# Patient Record
Sex: Female | Born: 1955
Health system: Southern US, Community
[De-identification: ages and names within clinical notes are randomized; demographics above are authoritative.]

## PROBLEM LIST (undated history)

## (undated) ENCOUNTER — Telehealth

## (undated) ENCOUNTER — Encounter

## (undated) ENCOUNTER — Encounter: Payer: PRIVATE HEALTH INSURANCE | Attending: Internal Medicine | Primary: Internal Medicine

## (undated) ENCOUNTER — Inpatient Hospital Stay

## (undated) ENCOUNTER — Encounter: Attending: Internal Medicine | Primary: Internal Medicine

## (undated) ENCOUNTER — Telehealth: Attending: Internal Medicine | Primary: Internal Medicine

## (undated) ENCOUNTER — Encounter: Attending: Health Service | Primary: Health Service

## (undated) ENCOUNTER — Ambulatory Visit

## (undated) ENCOUNTER — Telehealth
Attending: Pharmacist Clinician (PhC)/ Clinical Pharmacy Specialist | Primary: Pharmacist Clinician (PhC)/ Clinical Pharmacy Specialist

## (undated) ENCOUNTER — Encounter: Attending: Pulmonary Disease | Primary: Pulmonary Disease

## (undated) ENCOUNTER — Encounter: Attending: Gastroenterology | Primary: Gastroenterology

## (undated) ENCOUNTER — Ambulatory Visit: Payer: PRIVATE HEALTH INSURANCE

## (undated) ENCOUNTER — Ambulatory Visit: Payer: PRIVATE HEALTH INSURANCE | Attending: Health Service | Primary: Health Service

## (undated) ENCOUNTER — Ambulatory Visit: Payer: PRIVATE HEALTH INSURANCE | Attending: Psychiatry | Primary: Psychiatry

## (undated) ENCOUNTER — Telehealth: Attending: Psychiatry | Primary: Psychiatry

## (undated) ENCOUNTER — Encounter: Attending: Nurse Practitioner | Primary: Nurse Practitioner

## (undated) ENCOUNTER — Telehealth: Attending: Gastroenterology | Primary: Gastroenterology

## (undated) ENCOUNTER — Telehealth: Attending: Family | Primary: Family

## (undated) ENCOUNTER — Telehealth: Attending: Health Service | Primary: Health Service

## (undated) ENCOUNTER — Ambulatory Visit: Payer: PRIVATE HEALTH INSURANCE | Attending: Clinical | Primary: Clinical

## (undated) ENCOUNTER — Encounter: Attending: Certified Registered" | Primary: Certified Registered"

## (undated) ENCOUNTER — Ambulatory Visit: Attending: Family | Primary: Family

## (undated) ENCOUNTER — Ambulatory Visit
Payer: PRIVATE HEALTH INSURANCE | Attending: Student in an Organized Health Care Education/Training Program | Primary: Student in an Organized Health Care Education/Training Program

## (undated) DIAGNOSIS — F329 Major depressive disorder, single episode, unspecified: Secondary | ICD-10-CM

## (undated) DIAGNOSIS — D649 Anemia, unspecified: Secondary | ICD-10-CM

## (undated) DIAGNOSIS — M810 Age-related osteoporosis without current pathological fracture: Secondary | ICD-10-CM

## (undated) DIAGNOSIS — E7800 Pure hypercholesterolemia, unspecified: Secondary | ICD-10-CM

## (undated) DIAGNOSIS — K228 Other specified diseases of esophagus: Secondary | ICD-10-CM

## (undated) DIAGNOSIS — M1990 Unspecified osteoarthritis, unspecified site: Secondary | ICD-10-CM

## (undated) DIAGNOSIS — K7460 Unspecified cirrhosis of liver: Secondary | ICD-10-CM

## (undated) DIAGNOSIS — K760 Fatty (change of) liver, not elsewhere classified: Secondary | ICD-10-CM

## (undated) DIAGNOSIS — I509 Heart failure, unspecified: Secondary | ICD-10-CM

## (undated) DIAGNOSIS — Z9889 Other specified postprocedural states: Secondary | ICD-10-CM

## (undated) DIAGNOSIS — M797 Fibromyalgia: Secondary | ICD-10-CM

## (undated) DIAGNOSIS — E039 Hypothyroidism, unspecified: Secondary | ICD-10-CM

## (undated) DIAGNOSIS — K2289 Other specified disease of esophagus: Secondary | ICD-10-CM

## (undated) DIAGNOSIS — H5789 Other specified disorders of eye and adnexa: Secondary | ICD-10-CM

## (undated) DIAGNOSIS — I10 Essential (primary) hypertension: Secondary | ICD-10-CM

## (undated) DIAGNOSIS — F32A Depression, unspecified: Secondary | ICD-10-CM

## (undated) DIAGNOSIS — R112 Nausea with vomiting, unspecified: Secondary | ICD-10-CM

## (undated) DIAGNOSIS — N390 Urinary tract infection, site not specified: Secondary | ICD-10-CM

## (undated) HISTORY — PX: CHOLECYSTECTOMY: SHX55

## (undated) HISTORY — PX: BILATERAL SALPINGOOPHORECTOMY: SHX1223

## (undated) HISTORY — DX: Other specified disease of esophagus: K22.89

## (undated) HISTORY — DX: Fatty (change of) liver, not elsewhere classified: K76.0

## (undated) HISTORY — PX: VAGINAL HYSTERECTOMY: SUR661

## (undated) HISTORY — DX: Hypothyroidism, unspecified: E03.9

## (undated) HISTORY — DX: Urinary tract infection, site not specified: N39.0

## (undated) HISTORY — DX: Essential (primary) hypertension: I10

## (undated) HISTORY — PX: TONSILLECTOMY: SUR1361

## (undated) HISTORY — DX: Other specified diseases of esophagus: K22.8

## (undated) HISTORY — DX: Fibromyalgia: M79.7

## (undated) HISTORY — DX: Unspecified cirrhosis of liver: K74.60

## (undated) HISTORY — DX: Unspecified osteoarthritis, unspecified site: M19.90

## (undated) MED ORDER — PANTOPRAZOLE 40 MG TABLET,DELAYED RELEASE: Freq: Every day | 0 days

## (undated) MED ORDER — PROPRANOLOL 20 MG TABLET: Freq: Every day | 0 days

---
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
## 1978-09-18 HISTORY — PX: APPENDECTOMY: SHX54

## 2002-10-21 ENCOUNTER — Observation Stay (HOSPITAL_COMMUNITY): Admission: EM | Admit: 2002-10-21 | Discharge: 2002-10-22 | Payer: Self-pay | Admitting: Cardiology

## 2010-08-10 ENCOUNTER — Inpatient Hospital Stay (HOSPITAL_COMMUNITY)
Admission: EM | Admit: 2010-08-10 | Discharge: 2010-08-15 | Payer: Self-pay | Source: Home / Self Care | Admitting: Emergency Medicine

## 2010-08-11 ENCOUNTER — Ambulatory Visit: Payer: Self-pay | Admitting: Internal Medicine

## 2010-08-11 HISTORY — PX: UPPER GASTROINTESTINAL ENDOSCOPY: SHX188

## 2010-08-13 ENCOUNTER — Ambulatory Visit: Payer: Self-pay | Admitting: Gastroenterology

## 2010-08-18 HISTORY — PX: ESOPHAGOGASTRODUODENOSCOPY W/ BANDING: SHX1530

## 2010-08-29 ENCOUNTER — Ambulatory Visit: Payer: Self-pay | Admitting: Internal Medicine

## 2010-09-05 ENCOUNTER — Ambulatory Visit (HOSPITAL_COMMUNITY)
Admission: RE | Admit: 2010-09-05 | Discharge: 2010-09-05 | Payer: Self-pay | Source: Home / Self Care | Attending: Internal Medicine | Admitting: Internal Medicine

## 2010-09-05 ENCOUNTER — Ambulatory Visit: Payer: Self-pay | Admitting: Internal Medicine

## 2010-09-05 HISTORY — PX: UPPER GASTROINTESTINAL ENDOSCOPY: SHX188

## 2010-11-01 ENCOUNTER — Ambulatory Visit (INDEPENDENT_AMBULATORY_CARE_PROVIDER_SITE_OTHER): Payer: PRIVATE HEALTH INSURANCE | Admitting: Internal Medicine

## 2010-11-01 DIAGNOSIS — K7460 Unspecified cirrhosis of liver: Secondary | ICD-10-CM

## 2010-11-01 DIAGNOSIS — E8779 Other fluid overload: Secondary | ICD-10-CM

## 2010-11-01 DIAGNOSIS — K219 Gastro-esophageal reflux disease without esophagitis: Secondary | ICD-10-CM

## 2010-11-28 LAB — GLUCOSE, CAPILLARY
Glucose-Capillary: 97 mg/dL (ref 70–99)
Glucose-Capillary: 99 mg/dL (ref 70–99)

## 2010-11-29 LAB — DIFFERENTIAL
Basophils Absolute: 0 10*3/uL (ref 0.0–0.1)
Basophils Absolute: 0 10*3/uL (ref 0.0–0.1)
Basophils Absolute: 0 10*3/uL (ref 0.0–0.1)
Basophils Absolute: 0 10*3/uL (ref 0.0–0.1)
Basophils Absolute: 0 10*3/uL (ref 0.0–0.1)
Basophils Absolute: 0 10*3/uL (ref 0.0–0.1)
Basophils Absolute: 0 10*3/uL (ref 0.0–0.1)
Basophils Absolute: 0 10*3/uL (ref 0.0–0.1)
Basophils Absolute: 0 10*3/uL (ref 0.0–0.1)
Basophils Absolute: 0 10*3/uL (ref 0.0–0.1)
Basophils Absolute: 0 10*3/uL (ref 0.0–0.1)
Basophils Absolute: 0 10*3/uL (ref 0.0–0.1)
Basophils Absolute: 0 10*3/uL (ref 0.0–0.1)
Basophils Absolute: 0.1 10*3/uL (ref 0.0–0.1)
Basophils Relative: 0 % (ref 0–1)
Basophils Relative: 0 % (ref 0–1)
Basophils Relative: 0 % (ref 0–1)
Basophils Relative: 1 % (ref 0–1)
Basophils Relative: 1 % (ref 0–1)
Basophils Relative: 1 % (ref 0–1)
Basophils Relative: 1 % (ref 0–1)
Basophils Relative: 1 % (ref 0–1)
Basophils Relative: 1 % (ref 0–1)
Basophils Relative: 1 % (ref 0–1)
Basophils Relative: 1 % (ref 0–1)
Basophils Relative: 1 % (ref 0–1)
Basophils Relative: 1 % (ref 0–1)
Basophils Relative: 1 % (ref 0–1)
Eosinophils Absolute: 0.1 10*3/uL (ref 0.0–0.7)
Eosinophils Absolute: 0.1 10*3/uL (ref 0.0–0.7)
Eosinophils Absolute: 0.1 10*3/uL (ref 0.0–0.7)
Eosinophils Absolute: 0.1 10*3/uL (ref 0.0–0.7)
Eosinophils Absolute: 0.1 10*3/uL (ref 0.0–0.7)
Eosinophils Absolute: 0.1 10*3/uL (ref 0.0–0.7)
Eosinophils Absolute: 0.1 10*3/uL (ref 0.0–0.7)
Eosinophils Absolute: 0.1 10*3/uL (ref 0.0–0.7)
Eosinophils Absolute: 0.1 10*3/uL (ref 0.0–0.7)
Eosinophils Absolute: 0.1 10*3/uL (ref 0.0–0.7)
Eosinophils Absolute: 0.1 10*3/uL (ref 0.0–0.7)
Eosinophils Absolute: 0.1 10*3/uL (ref 0.0–0.7)
Eosinophils Absolute: 0.1 10*3/uL (ref 0.0–0.7)
Eosinophils Absolute: 0.1 10*3/uL (ref 0.0–0.7)
Eosinophils Relative: 1 % (ref 0–5)
Eosinophils Relative: 1 % (ref 0–5)
Eosinophils Relative: 1 % (ref 0–5)
Eosinophils Relative: 1 % (ref 0–5)
Eosinophils Relative: 2 % (ref 0–5)
Eosinophils Relative: 2 % (ref 0–5)
Eosinophils Relative: 2 % (ref 0–5)
Eosinophils Relative: 2 % (ref 0–5)
Eosinophils Relative: 2 % (ref 0–5)
Eosinophils Relative: 2 % (ref 0–5)
Eosinophils Relative: 2 % (ref 0–5)
Eosinophils Relative: 2 % (ref 0–5)
Eosinophils Relative: 2 % (ref 0–5)
Eosinophils Relative: 3 % (ref 0–5)
Lymphocytes Relative: 13 % (ref 12–46)
Lymphocytes Relative: 22 % (ref 12–46)
Lymphocytes Relative: 22 % (ref 12–46)
Lymphocytes Relative: 23 % (ref 12–46)
Lymphocytes Relative: 24 % (ref 12–46)
Lymphocytes Relative: 26 % (ref 12–46)
Lymphocytes Relative: 26 % (ref 12–46)
Lymphocytes Relative: 26 % (ref 12–46)
Lymphocytes Relative: 27 % (ref 12–46)
Lymphocytes Relative: 27 % (ref 12–46)
Lymphocytes Relative: 28 % (ref 12–46)
Lymphocytes Relative: 29 % (ref 12–46)
Lymphocytes Relative: 29 % (ref 12–46)
Lymphocytes Relative: 30 % (ref 12–46)
Lymphs Abs: 0.8 10*3/uL (ref 0.7–4.0)
Lymphs Abs: 0.9 10*3/uL (ref 0.7–4.0)
Lymphs Abs: 0.9 10*3/uL (ref 0.7–4.0)
Lymphs Abs: 1 10*3/uL (ref 0.7–4.0)
Lymphs Abs: 1 10*3/uL (ref 0.7–4.0)
Lymphs Abs: 1.1 10*3/uL (ref 0.7–4.0)
Lymphs Abs: 1.1 10*3/uL (ref 0.7–4.0)
Lymphs Abs: 1.2 10*3/uL (ref 0.7–4.0)
Lymphs Abs: 1.2 10*3/uL (ref 0.7–4.0)
Lymphs Abs: 1.3 10*3/uL (ref 0.7–4.0)
Lymphs Abs: 1.3 10*3/uL (ref 0.7–4.0)
Lymphs Abs: 1.5 10*3/uL (ref 0.7–4.0)
Lymphs Abs: 1.5 10*3/uL (ref 0.7–4.0)
Lymphs Abs: 1.6 10*3/uL (ref 0.7–4.0)
Monocytes Absolute: 0.2 10*3/uL (ref 0.1–1.0)
Monocytes Absolute: 0.3 10*3/uL (ref 0.1–1.0)
Monocytes Absolute: 0.3 10*3/uL (ref 0.1–1.0)
Monocytes Absolute: 0.3 10*3/uL (ref 0.1–1.0)
Monocytes Absolute: 0.4 10*3/uL (ref 0.1–1.0)
Monocytes Absolute: 0.4 10*3/uL (ref 0.1–1.0)
Monocytes Absolute: 0.4 10*3/uL (ref 0.1–1.0)
Monocytes Absolute: 0.4 10*3/uL (ref 0.1–1.0)
Monocytes Absolute: 0.4 10*3/uL (ref 0.1–1.0)
Monocytes Absolute: 0.4 10*3/uL (ref 0.1–1.0)
Monocytes Absolute: 0.4 10*3/uL (ref 0.1–1.0)
Monocytes Absolute: 0.6 10*3/uL (ref 0.1–1.0)
Monocytes Absolute: 0.6 10*3/uL (ref 0.1–1.0)
Monocytes Absolute: 0.7 10*3/uL (ref 0.1–1.0)
Monocytes Relative: 10 % (ref 3–12)
Monocytes Relative: 10 % (ref 3–12)
Monocytes Relative: 7 % (ref 3–12)
Monocytes Relative: 8 % (ref 3–12)
Monocytes Relative: 8 % (ref 3–12)
Monocytes Relative: 8 % (ref 3–12)
Monocytes Relative: 9 % (ref 3–12)
Monocytes Relative: 9 % (ref 3–12)
Monocytes Relative: 9 % (ref 3–12)
Monocytes Relative: 9 % (ref 3–12)
Monocytes Relative: 9 % (ref 3–12)
Monocytes Relative: 9 % (ref 3–12)
Monocytes Relative: 9 % (ref 3–12)
Monocytes Relative: 9 % (ref 3–12)
Neutro Abs: 2.2 10*3/uL (ref 1.7–7.7)
Neutro Abs: 2.2 10*3/uL (ref 1.7–7.7)
Neutro Abs: 2.2 10*3/uL (ref 1.7–7.7)
Neutro Abs: 2.3 10*3/uL (ref 1.7–7.7)
Neutro Abs: 2.3 10*3/uL (ref 1.7–7.7)
Neutro Abs: 2.5 10*3/uL (ref 1.7–7.7)
Neutro Abs: 2.7 10*3/uL (ref 1.7–7.7)
Neutro Abs: 2.8 10*3/uL (ref 1.7–7.7)
Neutro Abs: 2.8 10*3/uL (ref 1.7–7.7)
Neutro Abs: 2.9 10*3/uL (ref 1.7–7.7)
Neutro Abs: 3.5 10*3/uL (ref 1.7–7.7)
Neutro Abs: 3.7 10*3/uL (ref 1.7–7.7)
Neutro Abs: 4.9 10*3/uL (ref 1.7–7.7)
Neutro Abs: 7.3 10*3/uL (ref 1.7–7.7)
Neutrophils Relative %: 58 % (ref 43–77)
Neutrophils Relative %: 59 % (ref 43–77)
Neutrophils Relative %: 60 % (ref 43–77)
Neutrophils Relative %: 60 % (ref 43–77)
Neutrophils Relative %: 61 % (ref 43–77)
Neutrophils Relative %: 62 % (ref 43–77)
Neutrophils Relative %: 63 % (ref 43–77)
Neutrophils Relative %: 63 % (ref 43–77)
Neutrophils Relative %: 63 % (ref 43–77)
Neutrophils Relative %: 65 % (ref 43–77)
Neutrophils Relative %: 66 % (ref 43–77)
Neutrophils Relative %: 66 % (ref 43–77)
Neutrophils Relative %: 69 % (ref 43–77)
Neutrophils Relative %: 78 % — ABNORMAL HIGH (ref 43–77)

## 2010-11-29 LAB — CBC
HCT: 23.1 % — ABNORMAL LOW (ref 36.0–46.0)
HCT: 23.7 % — ABNORMAL LOW (ref 36.0–46.0)
HCT: 23.8 % — ABNORMAL LOW (ref 36.0–46.0)
HCT: 23.8 % — ABNORMAL LOW (ref 36.0–46.0)
HCT: 23.9 % — ABNORMAL LOW (ref 36.0–46.0)
HCT: 23.9 % — ABNORMAL LOW (ref 36.0–46.0)
HCT: 24.1 % — ABNORMAL LOW (ref 36.0–46.0)
HCT: 24.5 % — ABNORMAL LOW (ref 36.0–46.0)
HCT: 25.3 % — ABNORMAL LOW (ref 36.0–46.0)
HCT: 25.4 % — ABNORMAL LOW (ref 36.0–46.0)
HCT: 25.4 % — ABNORMAL LOW (ref 36.0–46.0)
HCT: 25.8 % — ABNORMAL LOW (ref 36.0–46.0)
HCT: 27.1 % — ABNORMAL LOW (ref 36.0–46.0)
HCT: 29.6 % — ABNORMAL LOW (ref 36.0–46.0)
Hemoglobin: 10.7 g/dL — ABNORMAL LOW (ref 12.0–15.0)
Hemoglobin: 8 g/dL — ABNORMAL LOW (ref 12.0–15.0)
Hemoglobin: 8.3 g/dL — ABNORMAL LOW (ref 12.0–15.0)
Hemoglobin: 8.3 g/dL — ABNORMAL LOW (ref 12.0–15.0)
Hemoglobin: 8.4 g/dL — ABNORMAL LOW (ref 12.0–15.0)
Hemoglobin: 8.4 g/dL — ABNORMAL LOW (ref 12.0–15.0)
Hemoglobin: 8.5 g/dL — ABNORMAL LOW (ref 12.0–15.0)
Hemoglobin: 8.5 g/dL — ABNORMAL LOW (ref 12.0–15.0)
Hemoglobin: 8.6 g/dL — ABNORMAL LOW (ref 12.0–15.0)
Hemoglobin: 8.9 g/dL — ABNORMAL LOW (ref 12.0–15.0)
Hemoglobin: 9.1 g/dL — ABNORMAL LOW (ref 12.0–15.0)
Hemoglobin: 9.1 g/dL — ABNORMAL LOW (ref 12.0–15.0)
Hemoglobin: 9.1 g/dL — ABNORMAL LOW (ref 12.0–15.0)
Hemoglobin: 9.6 g/dL — ABNORMAL LOW (ref 12.0–15.0)
MCH: 30.4 pg (ref 26.0–34.0)
MCH: 30.4 pg (ref 26.0–34.0)
MCH: 30.6 pg (ref 26.0–34.0)
MCH: 30.6 pg (ref 26.0–34.0)
MCH: 30.8 pg (ref 26.0–34.0)
MCH: 30.8 pg (ref 26.0–34.0)
MCH: 30.8 pg (ref 26.0–34.0)
MCH: 30.8 pg (ref 26.0–34.0)
MCH: 30.9 pg (ref 26.0–34.0)
MCH: 30.9 pg (ref 26.0–34.0)
MCH: 30.9 pg (ref 26.0–34.0)
MCH: 31.1 pg (ref 26.0–34.0)
MCH: 31.3 pg (ref 26.0–34.0)
MCH: 31.3 pg (ref 26.0–34.0)
MCHC: 34.6 g/dL (ref 30.0–36.0)
MCHC: 34.9 g/dL (ref 30.0–36.0)
MCHC: 35 g/dL (ref 30.0–36.0)
MCHC: 35.1 g/dL (ref 30.0–36.0)
MCHC: 35.1 g/dL (ref 30.0–36.0)
MCHC: 35.2 g/dL (ref 30.0–36.0)
MCHC: 35.3 g/dL (ref 30.0–36.0)
MCHC: 35.3 g/dL (ref 30.0–36.0)
MCHC: 35.3 g/dL (ref 30.0–36.0)
MCHC: 35.4 g/dL (ref 30.0–36.0)
MCHC: 35.6 g/dL (ref 30.0–36.0)
MCHC: 35.8 g/dL (ref 30.0–36.0)
MCHC: 35.8 g/dL (ref 30.0–36.0)
MCHC: 36.1 g/dL — ABNORMAL HIGH (ref 30.0–36.0)
MCV: 85.3 fL (ref 78.0–100.0)
MCV: 85.5 fL (ref 78.0–100.0)
MCV: 86.9 fL (ref 78.0–100.0)
MCV: 86.9 fL (ref 78.0–100.0)
MCV: 87.2 fL (ref 78.0–100.0)
MCV: 87.3 fL (ref 78.0–100.0)
MCV: 87.5 fL (ref 78.0–100.0)
MCV: 87.5 fL (ref 78.0–100.0)
MCV: 87.8 fL (ref 78.0–100.0)
MCV: 87.8 fL (ref 78.0–100.0)
MCV: 87.8 fL (ref 78.0–100.0)
MCV: 87.9 fL (ref 78.0–100.0)
MCV: 88.1 fL (ref 78.0–100.0)
MCV: 88.3 fL (ref 78.0–100.0)
Platelets: 37 10*3/uL — ABNORMAL LOW (ref 150–400)
Platelets: 41 10*3/uL — ABNORMAL LOW (ref 150–400)
Platelets: 41 10*3/uL — ABNORMAL LOW (ref 150–400)
Platelets: 41 10*3/uL — ABNORMAL LOW (ref 150–400)
Platelets: 43 10*3/uL — ABNORMAL LOW (ref 150–400)
Platelets: 43 10*3/uL — ABNORMAL LOW (ref 150–400)
Platelets: 44 10*3/uL — ABNORMAL LOW (ref 150–400)
Platelets: 45 10*3/uL — ABNORMAL LOW (ref 150–400)
Platelets: 48 10*3/uL — ABNORMAL LOW (ref 150–400)
Platelets: 53 10*3/uL — ABNORMAL LOW (ref 150–400)
Platelets: 54 10*3/uL — ABNORMAL LOW (ref 150–400)
Platelets: 69 10*3/uL — ABNORMAL LOW (ref 150–400)
Platelets: DECREASED 10*3/uL (ref 150–400)
Platelets: DECREASED 10*3/uL (ref 150–400)
RBC: 2.63 MIL/uL — ABNORMAL LOW (ref 3.87–5.11)
RBC: 2.69 MIL/uL — ABNORMAL LOW (ref 3.87–5.11)
RBC: 2.72 MIL/uL — ABNORMAL LOW (ref 3.87–5.11)
RBC: 2.72 MIL/uL — ABNORMAL LOW (ref 3.87–5.11)
RBC: 2.73 MIL/uL — ABNORMAL LOW (ref 3.87–5.11)
RBC: 2.73 MIL/uL — ABNORMAL LOW (ref 3.87–5.11)
RBC: 2.73 MIL/uL — ABNORMAL LOW (ref 3.87–5.11)
RBC: 2.79 MIL/uL — ABNORMAL LOW (ref 3.87–5.11)
RBC: 2.88 MIL/uL — ABNORMAL LOW (ref 3.87–5.11)
RBC: 2.91 MIL/uL — ABNORMAL LOW (ref 3.87–5.11)
RBC: 2.97 MIL/uL — ABNORMAL LOW (ref 3.87–5.11)
RBC: 2.97 MIL/uL — ABNORMAL LOW (ref 3.87–5.11)
RBC: 3.12 MIL/uL — ABNORMAL LOW (ref 3.87–5.11)
RBC: 3.47 MIL/uL — ABNORMAL LOW (ref 3.87–5.11)
RDW: 15.3 % (ref 11.5–15.5)
RDW: 15.3 % (ref 11.5–15.5)
RDW: 15.4 % (ref 11.5–15.5)
RDW: 15.4 % (ref 11.5–15.5)
RDW: 15.5 % (ref 11.5–15.5)
RDW: 15.6 % — ABNORMAL HIGH (ref 11.5–15.5)
RDW: 15.7 % — ABNORMAL HIGH (ref 11.5–15.5)
RDW: 15.8 % — ABNORMAL HIGH (ref 11.5–15.5)
RDW: 15.8 % — ABNORMAL HIGH (ref 11.5–15.5)
RDW: 15.8 % — ABNORMAL HIGH (ref 11.5–15.5)
RDW: 15.8 % — ABNORMAL HIGH (ref 11.5–15.5)
RDW: 15.8 % — ABNORMAL HIGH (ref 11.5–15.5)
RDW: 16 % — ABNORMAL HIGH (ref 11.5–15.5)
RDW: 16.1 % — ABNORMAL HIGH (ref 11.5–15.5)
WBC: 3.5 10*3/uL — ABNORMAL LOW (ref 4.0–10.5)
WBC: 3.5 10*3/uL — ABNORMAL LOW (ref 4.0–10.5)
WBC: 3.6 10*3/uL — ABNORMAL LOW (ref 4.0–10.5)
WBC: 3.6 10*3/uL — ABNORMAL LOW (ref 4.0–10.5)
WBC: 3.7 10*3/uL — ABNORMAL LOW (ref 4.0–10.5)
WBC: 4.3 10*3/uL (ref 4.0–10.5)
WBC: 4.3 10*3/uL (ref 4.0–10.5)
WBC: 4.4 10*3/uL (ref 4.0–10.5)
WBC: 4.5 10*3/uL (ref 4.0–10.5)
WBC: 4.6 10*3/uL (ref 4.0–10.5)
WBC: 5.6 10*3/uL (ref 4.0–10.5)
WBC: 6.1 10*3/uL (ref 4.0–10.5)
WBC: 7.1 10*3/uL (ref 4.0–10.5)
WBC: 9.3 10*3/uL (ref 4.0–10.5)

## 2010-11-29 LAB — HEPATIC FUNCTION PANEL
ALT: 31 U/L (ref 0–35)
AST: 37 U/L (ref 0–37)
Albumin: 3.1 g/dL — ABNORMAL LOW (ref 3.5–5.2)
Alkaline Phosphatase: 68 U/L (ref 39–117)
Bilirubin, Direct: 0.1 mg/dL (ref 0.0–0.3)
Indirect Bilirubin: 1.2 mg/dL — ABNORMAL HIGH (ref 0.3–0.9)
Total Bilirubin: 1.3 mg/dL — ABNORMAL HIGH (ref 0.3–1.2)
Total Protein: 6.2 g/dL (ref 6.0–8.3)

## 2010-11-29 LAB — RETICULOCYTES
RBC.: 3.16 MIL/uL — ABNORMAL LOW (ref 3.87–5.11)
Retic Count, Absolute: 113.8 10*3/uL (ref 19.0–186.0)
Retic Ct Pct: 3.6 % — ABNORMAL HIGH (ref 0.4–3.1)

## 2010-11-29 LAB — GLUCOSE, CAPILLARY
Glucose-Capillary: 121 mg/dL — ABNORMAL HIGH (ref 70–99)
Glucose-Capillary: 126 mg/dL — ABNORMAL HIGH (ref 70–99)
Glucose-Capillary: 142 mg/dL — ABNORMAL HIGH (ref 70–99)
Glucose-Capillary: 148 mg/dL — ABNORMAL HIGH (ref 70–99)
Glucose-Capillary: 150 mg/dL — ABNORMAL HIGH (ref 70–99)
Glucose-Capillary: 154 mg/dL — ABNORMAL HIGH (ref 70–99)
Glucose-Capillary: 162 mg/dL — ABNORMAL HIGH (ref 70–99)
Glucose-Capillary: 166 mg/dL — ABNORMAL HIGH (ref 70–99)
Glucose-Capillary: 175 mg/dL — ABNORMAL HIGH (ref 70–99)
Glucose-Capillary: 179 mg/dL — ABNORMAL HIGH (ref 70–99)
Glucose-Capillary: 186 mg/dL — ABNORMAL HIGH (ref 70–99)
Glucose-Capillary: 186 mg/dL — ABNORMAL HIGH (ref 70–99)
Glucose-Capillary: 188 mg/dL — ABNORMAL HIGH (ref 70–99)
Glucose-Capillary: 188 mg/dL — ABNORMAL HIGH (ref 70–99)
Glucose-Capillary: 189 mg/dL — ABNORMAL HIGH (ref 70–99)
Glucose-Capillary: 200 mg/dL — ABNORMAL HIGH (ref 70–99)
Glucose-Capillary: 206 mg/dL — ABNORMAL HIGH (ref 70–99)
Glucose-Capillary: 207 mg/dL — ABNORMAL HIGH (ref 70–99)
Glucose-Capillary: 212 mg/dL — ABNORMAL HIGH (ref 70–99)
Glucose-Capillary: 217 mg/dL — ABNORMAL HIGH (ref 70–99)
Glucose-Capillary: 230 mg/dL — ABNORMAL HIGH (ref 70–99)
Glucose-Capillary: 232 mg/dL — ABNORMAL HIGH (ref 70–99)
Glucose-Capillary: 235 mg/dL — ABNORMAL HIGH (ref 70–99)
Glucose-Capillary: 239 mg/dL — ABNORMAL HIGH (ref 70–99)
Glucose-Capillary: 259 mg/dL — ABNORMAL HIGH (ref 70–99)
Glucose-Capillary: 281 mg/dL — ABNORMAL HIGH (ref 70–99)
Glucose-Capillary: 288 mg/dL — ABNORMAL HIGH (ref 70–99)
Glucose-Capillary: 326 mg/dL — ABNORMAL HIGH (ref 70–99)

## 2010-11-29 LAB — CARDIAC PANEL(CRET KIN+CKTOT+MB+TROPI)
CK, MB: 2.1 ng/mL (ref 0.3–4.0)
CK, MB: 2.4 ng/mL (ref 0.3–4.0)
CK, MB: 2.8 ng/mL (ref 0.3–4.0)
Relative Index: 2.2 (ref 0.0–2.5)
Relative Index: 2.7 — ABNORMAL HIGH (ref 0.0–2.5)
Relative Index: INVALID (ref 0.0–2.5)
Total CK: 102 U/L (ref 7–177)
Total CK: 109 U/L (ref 7–177)
Total CK: 97 U/L (ref 7–177)
Troponin I: 0.03 ng/mL (ref 0.00–0.06)
Troponin I: 0.06 ng/mL (ref 0.00–0.06)
Troponin I: 0.08 ng/mL — ABNORMAL HIGH (ref 0.00–0.06)

## 2010-11-29 LAB — BASIC METABOLIC PANEL
BUN: 11 mg/dL (ref 6–23)
BUN: 12 mg/dL (ref 6–23)
BUN: 17 mg/dL (ref 6–23)
BUN: 8 mg/dL (ref 6–23)
BUN: 8 mg/dL (ref 6–23)
CO2: 22 mEq/L (ref 19–32)
CO2: 25 mEq/L (ref 19–32)
CO2: 26 mEq/L (ref 19–32)
CO2: 26 mEq/L (ref 19–32)
CO2: 27 mEq/L (ref 19–32)
Calcium: 7.8 mg/dL — ABNORMAL LOW (ref 8.4–10.5)
Calcium: 8 mg/dL — ABNORMAL LOW (ref 8.4–10.5)
Calcium: 8 mg/dL — ABNORMAL LOW (ref 8.4–10.5)
Calcium: 8.1 mg/dL — ABNORMAL LOW (ref 8.4–10.5)
Calcium: 8.3 mg/dL — ABNORMAL LOW (ref 8.4–10.5)
Chloride: 108 mEq/L (ref 96–112)
Chloride: 108 mEq/L (ref 96–112)
Chloride: 110 mEq/L (ref 96–112)
Chloride: 110 mEq/L (ref 96–112)
Chloride: 111 mEq/L (ref 96–112)
Creatinine, Ser: 0.66 mg/dL (ref 0.4–1.2)
Creatinine, Ser: 0.7 mg/dL (ref 0.4–1.2)
Creatinine, Ser: 0.71 mg/dL (ref 0.4–1.2)
Creatinine, Ser: 0.73 mg/dL (ref 0.4–1.2)
Creatinine, Ser: 0.79 mg/dL (ref 0.4–1.2)
GFR calc Af Amer: >60 mL/min (ref >60)
GFR calc Af Amer: >60 mL/min (ref >60)
GFR calc Af Amer: >60 mL/min (ref >60)
GFR calc Af Amer: >60 mL/min (ref >60)
GFR calc Af Amer: >60 mL/min (ref >60)
GFR calc non Af Amer: >60 mL/min (ref >60)
GFR calc non Af Amer: >60 mL/min (ref >60)
GFR calc non Af Amer: >60 mL/min (ref >60)
GFR calc non Af Amer: >60 mL/min (ref >60)
GFR calc non Af Amer: >60 mL/min (ref >60)
Glucose, Bld: 131 mg/dL — ABNORMAL HIGH (ref 70–99)
Glucose, Bld: 170 mg/dL — ABNORMAL HIGH (ref 70–99)
Glucose, Bld: 175 mg/dL — ABNORMAL HIGH (ref 70–99)
Glucose, Bld: 250 mg/dL — ABNORMAL HIGH (ref 70–99)
Glucose, Bld: 298 mg/dL — ABNORMAL HIGH (ref 70–99)
Potassium: 3.4 mEq/L — ABNORMAL LOW (ref 3.5–5.1)
Potassium: 3.4 mEq/L — ABNORMAL LOW (ref 3.5–5.1)
Potassium: 3.6 mEq/L (ref 3.5–5.1)
Potassium: 3.7 mEq/L (ref 3.5–5.1)
Potassium: 3.7 mEq/L (ref 3.5–5.1)
Sodium: 138 mEq/L (ref 135–145)
Sodium: 139 mEq/L (ref 135–145)
Sodium: 139 mEq/L (ref 135–145)
Sodium: 139 mEq/L (ref 135–145)
Sodium: 140 mEq/L (ref 135–145)

## 2010-11-29 LAB — LIPASE, BLOOD: Lipase: 44 U/L (ref 11–59)

## 2010-11-29 LAB — CROSSMATCH
ABO/RH(D): O NEG
Antibody Screen: NEGATIVE
Unit division: 0
Unit division: 0

## 2010-11-29 LAB — COMPREHENSIVE METABOLIC PANEL
ALT: 31 U/L (ref 0–35)
AST: 32 U/L (ref 0–37)
Albumin: 3.1 g/dL — ABNORMAL LOW (ref 3.5–5.2)
Alkaline Phosphatase: 62 U/L (ref 39–117)
BUN: 18 mg/dL (ref 6–23)
CO2: 26 mEq/L (ref 19–32)
Calcium: 8.2 mg/dL — ABNORMAL LOW (ref 8.4–10.5)
Chloride: 109 mEq/L (ref 96–112)
Creatinine, Ser: 0.69 mg/dL (ref 0.4–1.2)
GFR calc Af Amer: >60 mL/min (ref >60)
GFR calc non Af Amer: >60 mL/min (ref >60)
Glucose, Bld: 248 mg/dL — ABNORMAL HIGH (ref 70–99)
Potassium: 5 mEq/L (ref 3.5–5.1)
Sodium: 140 mEq/L (ref 135–145)
Total Bilirubin: 1 mg/dL (ref 0.3–1.2)
Total Protein: 6.1 g/dL (ref 6.0–8.3)

## 2010-11-29 LAB — CK TOTAL AND CKMB (NOT AT ARMC)
CK, MB: 1.6 ng/mL (ref 0.3–4.0)
Relative Index: INVALID (ref 0.0–2.5)
Total CK: 79 U/L (ref 7–177)

## 2010-11-29 LAB — PLATELET COUNT: Platelets: 65 10*3/uL — ABNORMAL LOW (ref 150–400)

## 2010-11-29 LAB — HEPATITIS B SURFACE ANTIGEN: Hepatitis B Surface Ag: NEGATIVE

## 2010-11-29 LAB — PROTIME-INR
INR: 1.39 (ref 0.00–1.49)
Prothrombin Time: 17.3 seconds — ABNORMAL HIGH (ref 11.6–15.2)

## 2010-11-29 LAB — ANA: Anti Nuclear Antibody(ANA): NEGATIVE

## 2010-11-29 LAB — APTT: aPTT: 32 seconds (ref 24–37)

## 2010-11-29 LAB — HEPATITIS C ANTIBODY: HCV Ab: NEGATIVE

## 2010-11-29 LAB — SAMPLE TO BLOOD BANK

## 2010-11-29 LAB — MRSA PCR SCREENING: MRSA by PCR: NEGATIVE

## 2010-11-29 LAB — AMYLASE: Amylase: 31 U/L (ref 0–105)

## 2010-11-29 LAB — IRON AND TIBC
Iron: 144 ug/dL — ABNORMAL HIGH (ref 42–135)
Saturation Ratios: 44 % (ref 20–55)
TIBC: 324 ug/dL (ref 250–470)
UIBC: 180 ug/dL

## 2010-11-29 LAB — VITAMIN B12: Vitamin B-12: 499 pg/mL (ref 211–911)

## 2010-11-29 LAB — MITOCHONDRIAL ANTIBODIES: Mitochondrial M2 Ab, IgG: 0.87 (ref <0.91)

## 2010-11-29 LAB — TSH: TSH: 0.629 u[IU]/mL (ref 0.350–4.500)

## 2010-11-29 LAB — TROPONIN I: Troponin I: 0.04 ng/mL (ref 0.00–0.06)

## 2010-11-29 LAB — ABO/RH: ABO/RH(D): O NEG

## 2010-11-29 LAB — HEPATITIS B SURFACE ANTIBODY,QUALITATIVE: Hep B S Ab: POSITIVE — AB

## 2010-11-29 LAB — FERRITIN: Ferritin: 25 ng/mL (ref 10–291)

## 2010-11-29 LAB — FOLATE: Folate: >20 ng/mL

## 2011-02-03 NOTE — Discharge Summary (Signed)
NAMEMALKA, Maria [name]                         ACCOUNT NO.:  [id number]   MEDICAL RECORD NO.:  [id number]                   PATIENT TYPE:  INP   LOCATION:  4481                                 FACILITY:  [facility]   PHYSICIAN:  Satira Sark, M.D. [facility]        DATE OF BIRTH:  [birth year]   DATE OF ADMISSION:  10/21/2002  DATE OF DISCHARGE:  10/22/2002                           DISCHARGE SUMMARY - REFERRING   PROCEDURES:  Coronary angiogram, October 21, 2002.   REASON FOR ADMISSION:  The patient is a 55 year old female, with no prior  history of heart disease but with multiple cardiac risk factors notable for  type 2 diabetes mellitus, hypertension, dyslipidemia, family history of  premature coronary artery disease, obesity, and postmenopausal status, who  initially presented to [facility] with new-onset chest heaviness.  Serial cardiac enzymes were negative, and electrocardiogram showed no  ischemic changes.  The patient was seen in consultation by Dr. Johnny Bridge,  who felt that her pretest probability for coronary artery disease, given her  multiple cardiac risk factors, was high and, therefore, recommended  proceeding directly with diagnostic coronary angiography.  Arrangements were  thus made for transfer to [facility].  Of note, the patient had had a CT  scan of the chest which were negative for pulmonary embolus.  Please refer  to dictated consultation note for full details.   LABORATORY DATA:  WBC 5.2, hemoglobin 12, platelets 96; follow-up platelet  level 113 prior to discharge.  Sodium 142, potassium 3.7, glucose 125, BUN  14, creatinine 0.7.   HOSPITAL COURSE:  The patient presented directly from [facility] for  coronary angiography that same day, performed by Dr. Sabino Snipes (see  catheterization report for full details), revealing minimal coronary artery  disease with normal left ventricle.  Specifically, there was 40% proximal  ramus intermedius and  otherwise normal LAD, CFX, and RCA arteries.  LV-gram  was normal (65%), with no evidence of WMAs or valvular abnormalities.   Dr. Albertine Patricia recommended aggressive risk-factor modification.  The patient was  kept for overnight observation and had no complaints of chest pain.  However, a follow-up post-catheterization CBC was notable for a platelet  level of 96.  Dr. Domenic Polite compared this to a previous level of 136 at  [facility].  It was surmised that this may have been the result of  prior treatment with aspirin or Lovenox.  A CBC was repeated, and a follow-  up platelet level was improved (113).  Given this, the patient was cleared  for discharge with instructions for a follow-up CBC in one week.   The patient also raised the question of starting a cholesterol-lower agent  for her previously documented dyslipidemia.  She reported an LDL level of  150 this past August.  To date, she has been treating her dyslipidemia with  Red Yeast rice.  We spent a bit of time discussing the importance of risk-  factor modification  including lipid lowering.  However, she also reported  having a history of mildly elevated liver enzymes and idiopathic  hepatomegaly.  She has been evaluated by Dr. Laural Golden and is due to return for  repeat blood test.  I, therefore, advised her to refrain from initiating any  statin agent at this point until she has repeat lipid and liver profiles.  She was agreeable with this plan.   We also discussed the importance of being on a prophylactic dose of aspirin  81 mg daily for secondary prevention.  However, at this point I recommended  waiting until further follow-up platelet levels and conferring with her  physician.  If her platelet levels continue to rise, then I would recommend  initiating low-dose aspirin with close monitoring of platelet levels.   DISCHARGE MEDICATIONS:  1. Ziac 5 mg daily.  2. Glucotrol XL 10 mg daily.  3. Glucophage 500 mg daily.  4. Taxol  40 mg daily.  5. Bextra 20 mg daily.  6. Slo-Iron 1 tablet daily.  7. Stool softener 1 capsule daily.  8. Estrogen patch as previously directed.   DISCHARGE INSTRUCTIONS:  No heavy lifting/strenuous activity or driving x 2  days.  Maintain low-fat, low-cholesterol, and diabetic diet.  Call the  office if there is any swelling/bleeding in the groin.   The patient will need a follow-up CBC in one week.   The patient is scheduled to follow up with Roque Cash, P.A.C. at the [facility] on Thursday, November 06, 2002, at 1:15 p.m.  She will  then return to Dr. Johnny Bridge for continued cardiac follow-up.   The patient is also instructed to return to Dr. Jerene Bears in the ensuing  few weeks.   DISCHARGE DIAGNOSES:  1. Nonobstructive single-vessel coronary artery disease/normal left     ventricular function.     A. Coronary angiogram, October 21, 2002.  2. Multiple cardiac risk factors.     A. Dyslipidemia.     B. Hypertension.     C. Type 2 diabetes mellitus.     D. Obesity.     E. Family history of premature coronary artery disease.     F. Postmenopausal.  3. Mild thrombocytopenia.     A. Question secondary to aspirin versus Lovenox.     Maria [name], P.A. [facility]                      Satira Sark, M.D. [facility]    GS/MEDQ  D:  10/22/2002  T:  10/22/2002  Job:  [id number]   cc:   Jerene Bears  [address]  [place]  [place] [address]  Fax: [phone]

## 2011-02-03 NOTE — Cardiovascular Report (Signed)
Maria [name], Maria [name]                         ACCOUNT NO.:  [id number]   MEDICAL RECORD NO.:  [id number]                   PATIENT TYPE:  INP   LOCATION:  4702                                 FACILITY:  [place]   PHYSICIAN:  Ethelle Lyon, M.D. [facility]         DATE OF BIRTH:  [birth year]   DATE OF PROCEDURE:  10/21/2002  DATE OF DISCHARGE:                              CARDIAC CATHETERIZATION   PROCEDURE:  Left heart catheterization, left ventriculography, coronary  angiography.   INDICATIONS FOR PROCEDURE:  A 55 year old lady with multiple risk factors  for atherosclerotic disease who presents with chest discomfort.  Troponins  were within normal limits.  Electrocardiogram was normal.  Given her  multiple risk factors, she was referred for diagnostic angiography for  definitive diagnosis.   DIAGNOSTIC TECHNIQUE:  Informed consent was obtained.  Under 1% lidocaine  local anesthesia, a 6-French sheath was placed in the right femoral artery  using the modified Seldinger technique.  Diagnostic angiography was  performed using JL4 and No Torque right catheters.  Ventriculography was  performed in the RAO projection using the pigtail catheter.  The patient  tolerated the procedure well and was transferred to the holding room in  stable condition.  Sheaths are to be removed eight hours after the last dose  of enoxaparin.   COMPLICATIONS:  None.   FINDINGS:  1. Ejection fraction 65% without regional wall motion abnormality.  2. No aortic stenosis or mitral regurgitation.  3. Left ventricle:  143/14/18 after the administration of contrast.  4. Left main:  Angiographically normal.  5. Left anterior descending artery:  The left anterior descending artery is     a moderate-sized vessel, giving rise to no substantial diagonal vessels.     It is angiographically normal.  6. Ramus intermedius:  The ramus is a large vessel.  There is a 40% ostial     stenosis.  7. Circumflex:  The circumflex  is a large vessel, giving rise to two obtuse     marginal branches.  It is angiographically normal.  8. Right coronary artery:  The right coronary artery is a moderate-sized,     dominant vessel.  It is angiographically normal.    IMPRESSION/RECOMMENDATIONS:  The patient has minimal coronary artery disease  with preserved left ventricular size and systolic function.  I suspect that  her chest pain was noncardiac in etiology.  Will focus on aggressive risk  factor modification.  She is to follow up with Dr. Woody Seller for further  evaluation of her noncardiac chest discomfort, should it recur.                                               Ethelle Lyon, M.D. [facility]    WED/MEDQ  D:  10/21/2002  T:  10/21/2002  Job:  [id number]   cc:   Jerene Bears  [address]  [place]  [place] [address]  Fax: [phone]   Satira Sark, M.D. [facility]  [address] [place] [address]  Fax: [phone]

## 2011-02-06 ENCOUNTER — Ambulatory Visit (INDEPENDENT_AMBULATORY_CARE_PROVIDER_SITE_OTHER): Payer: PRIVATE HEALTH INSURANCE | Admitting: Internal Medicine

## 2011-02-06 DIAGNOSIS — K7689 Other specified diseases of liver: Secondary | ICD-10-CM

## 2011-02-06 DIAGNOSIS — Z8719 Personal history of other diseases of the digestive system: Secondary | ICD-10-CM

## 2011-03-15 ENCOUNTER — Ambulatory Visit (HOSPITAL_COMMUNITY)
Admission: RE | Admit: 2011-03-15 | Discharge: 2011-03-15 | Disposition: A | Discharge status: Home / Self Care | Payer: PRIVATE HEALTH INSURANCE | Source: Ambulatory Visit | Attending: Internal Medicine | Admitting: Internal Medicine

## 2011-03-15 ENCOUNTER — Other Ambulatory Visit (INDEPENDENT_AMBULATORY_CARE_PROVIDER_SITE_OTHER): Payer: Self-pay | Admitting: Internal Medicine

## 2011-03-15 ENCOUNTER — Encounter (HOSPITAL_BASED_OUTPATIENT_CLINIC_OR_DEPARTMENT_OTHER): Payer: PRIVATE HEALTH INSURANCE | Admitting: Internal Medicine

## 2011-03-15 DIAGNOSIS — I10 Essential (primary) hypertension: Secondary | ICD-10-CM | POA: Insufficient documentation

## 2011-03-15 DIAGNOSIS — Z800 Family history of malignant neoplasm of digestive organs: Secondary | ICD-10-CM

## 2011-03-15 DIAGNOSIS — K5520 Angiodysplasia of colon without hemorrhage: Secondary | ICD-10-CM

## 2011-03-15 DIAGNOSIS — Z79890 Hormone replacement therapy (postmenopausal): Secondary | ICD-10-CM | POA: Insufficient documentation

## 2011-03-15 DIAGNOSIS — E119 Type 2 diabetes mellitus without complications: Secondary | ICD-10-CM | POA: Insufficient documentation

## 2011-03-15 DIAGNOSIS — D126 Benign neoplasm of colon, unspecified: Secondary | ICD-10-CM

## 2011-03-15 DIAGNOSIS — Z1211 Encounter for screening for malignant neoplasm of colon: Secondary | ICD-10-CM

## 2011-03-15 DIAGNOSIS — K766 Portal hypertension: Secondary | ICD-10-CM | POA: Insufficient documentation

## 2011-03-15 DIAGNOSIS — K7460 Unspecified cirrhosis of liver: Secondary | ICD-10-CM | POA: Insufficient documentation

## 2011-03-15 DIAGNOSIS — Z79899 Other long term (current) drug therapy: Secondary | ICD-10-CM | POA: Insufficient documentation

## 2011-03-15 DIAGNOSIS — I8500 Esophageal varices without bleeding: Secondary | ICD-10-CM

## 2011-03-15 DIAGNOSIS — Z01812 Encounter for preprocedural laboratory examination: Secondary | ICD-10-CM | POA: Insufficient documentation

## 2011-03-15 DIAGNOSIS — K319 Disease of stomach and duodenum, unspecified: Secondary | ICD-10-CM | POA: Insufficient documentation

## 2011-03-15 DIAGNOSIS — K7689 Other specified diseases of liver: Secondary | ICD-10-CM

## 2011-03-15 DIAGNOSIS — I8510 Secondary esophageal varices without bleeding: Secondary | ICD-10-CM | POA: Insufficient documentation

## 2011-03-15 HISTORY — PX: COLONOSCOPY: SHX174

## 2011-03-15 HISTORY — PX: UPPER GASTROINTESTINAL ENDOSCOPY: SHX188

## 2011-03-15 LAB — DIFFERENTIAL
Basophils Absolute: 0 10*3/uL (ref 0.0–0.1)
Basophils Relative: 1 % (ref 0–1)
Eosinophils Absolute: 0.1 10*3/uL (ref 0.0–0.7)
Eosinophils Relative: 1 % (ref 0–5)
Lymphocytes Relative: 23 % (ref 12–46)
Lymphs Abs: 1.1 10*3/uL (ref 0.7–4.0)
Monocytes Absolute: 0.4 10*3/uL (ref 0.1–1.0)
Monocytes Relative: 9 % (ref 3–12)
Neutro Abs: 3.2 10*3/uL (ref 1.7–7.7)
Neutrophils Relative %: 66 % (ref 43–77)

## 2011-03-15 LAB — COMPREHENSIVE METABOLIC PANEL
ALT: 33 U/L (ref 0–35)
AST: 41 U/L — ABNORMAL HIGH (ref 0–37)
Albumin: 3.7 g/dL (ref 3.5–5.2)
Alkaline Phosphatase: 118 U/L — ABNORMAL HIGH (ref 39–117)
BUN: 15 mg/dL (ref 6–23)
CO2: 23 mEq/L (ref 19–32)
Calcium: 8.9 mg/dL (ref 8.4–10.5)
Chloride: 105 mEq/L (ref 96–112)
Creatinine, Ser: 0.66 mg/dL (ref 0.50–1.10)
GFR calc Af Amer: >60 mL/min (ref >60)
GFR calc non Af Amer: >60 mL/min (ref >60)
Glucose, Bld: 145 mg/dL — ABNORMAL HIGH (ref 70–99)
Potassium: 3.6 mEq/L (ref 3.5–5.1)
Sodium: 137 mEq/L (ref 135–145)
Total Bilirubin: 0.9 mg/dL (ref 0.3–1.2)
Total Protein: 7.9 g/dL (ref 6.0–8.3)

## 2011-03-15 LAB — CBC
HCT: 35.9 % — ABNORMAL LOW (ref 36.0–46.0)
Hemoglobin: 11.4 g/dL — ABNORMAL LOW (ref 12.0–15.0)
MCH: 24.6 pg — ABNORMAL LOW (ref 26.0–34.0)
MCHC: 31.8 g/dL (ref 30.0–36.0)
MCV: 77.4 fL — ABNORMAL LOW (ref 78.0–100.0)
Platelets: DECREASED 10*3/uL (ref 150–400)
RBC: 4.64 MIL/uL (ref 3.87–5.11)
RDW: 17.5 % — ABNORMAL HIGH (ref 11.5–15.5)
WBC: 4.9 10*3/uL (ref 4.0–10.5)

## 2011-03-15 LAB — GLUCOSE, CAPILLARY: Glucose-Capillary: 186 mg/dL — ABNORMAL HIGH (ref 70–99)

## 2011-03-16 LAB — AFP TUMOR MARKER: AFP-Tumor Marker: 4.1 ng/mL (ref 0.0–8.0)

## 2011-03-16 LAB — FERRITIN: Ferritin: 12 ng/mL (ref 10–291)

## 2011-03-28 NOTE — Op Note (Signed)
NAMEJARRAH, Maria [name]             ACCOUNT NO.:  [id number]  MEDICAL RECORD NO.:  [id number]  LOCATION:  DAYP                          FACILITY:  [facility]  PHYSICIAN:  Hildred Laser, M.D.    DATE OF BIRTH:  [birth year]  DATE OF PROCEDURE:  03/15/2011 DATE OF DISCHARGE:                              OPERATIVE REPORT   PROCEDURE:  Esophagogastroduodenoscopy with esophageal variceal banding followed by a colonoscopy with snare polypectomy.  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 She was banded acutely in November 2011 and electively in December 2011. She is maintained on nadolol.  She is here for elective EGD and if she has recurrent varices, they would be banded.  She is also undergoing screening colonoscopy.  Family history is positive for colon carcinoma in a brother who developed disease at age 56 and appears to be disease free over a year later.  Procedure risks were reviewed with the patient.  Informed consent was obtained.  MEDICATIONS FOR CONSCIOUS SEDATION:  Cetacaine spray for pharyngeal topical anesthesia, Demerol 50 mg IV, Versed 10 mg IV.  FINDINGS:  Procedure was performed in endoscopy suite.  The patient's vital signs and O2 saturations were monitored during the procedure and remained stable.  1. Esophagogastroduodenoscopy.  The patient was placed in left lateral     recumbent position and Pentax videoscope was passed via oropharynx     without any difficulty into esophagus.  Esophagus:  Mucosa of the proximal and middle segment was normal. Distally, there were 3 short bulbous columns.  There were few smaller columns.  GE junction was located at 38 cm and was unremarkable.  Stomach:  It was empty and distended very well with insufflation.  Folds of proximal stomach are normal.  Examination of mucosa revealed some snake skinning or mosaic pattern at gastric body  consistent with portal gastropathy.  There was nodularity to antral mucosa unchanged from previous exam.  Pyloric channel was patent.  Angularis, fundus, and cardia were examined by retroflexing scope and were remarkable.  Duodenum:  Bulbar mucosa was normal.  Scope was passed to second part of duodenum where mucosa and folds were normal.  Endoscope was withdrawn. Banding device was loaded onto the scope and endoscope was reintroduced in the esophagus.  Three columns were banded starting distally just above the GE junction.  No bleeding was noted during this process. Endoscope was then withdrawn and the patient prepared for procedure #2.  1. Colonoscopy.  Rectal examination performed.  No abnormality was     noted on external or digital exam.  Pentax videoscope was placed     through rectum and advanced under vision into sigmoid colon and     beyond.  Preparation was excellent.  Scope was passed into cecum     which was identified by appendiceal orifice and ileocecal valve.     There was 3-4 mm polyp at cecum which was ablated via cold biopsy     and other similar size polyp was ablated via cold biopsy from the     ascending colon.  There was 6-7 mm at proximal transverse colon  which was snared and retrieved for histologic examination.  Two     more small polyps were noted and coagulated using snare tip.  One     was at the splenic flexure and one in descending colon.  Finally,     there was a 7-8 mm AV malformation at splenic flexure which was not     bleeding and left alone.  Mucosa of rest of the colon was normal.     Rectal mucosa similarly was normal.  Scope was retroflexed to     examine anorectal junction which was unremarkable.  Endoscope was     withdrawn.  Withdrawal time was 19 minutes.  The patient tolerated     the procedures well.  FINAL DIAGNOSES: 1. Recurrent esophageal varices grade III, 3 columns; these were     banded. 2. Mild portal gastropathy. 3. Stable  antral mucosal nodularity. 4. Two small polyps ablated via cold biopsy, one from the cecum and     second one from the ascending colon. 5. A 6-7 mm polyp snared from proximal transverse colon. 6. A single arteriovenous malformation at splenic flexure. 7. Two small polyps were coagulated using snare, one at splenic     flexure and one at descending colon.  RECOMMENDATIONS: 1. She will stay on full liquids today. 2. She will resume her usual medications. 3. No aspirin or NSAIDs for 10 days. 4. Carafate liquid 1 g a.c. and at bedtime for 2 weeks. 5. Unless she has problems, she will return for OV in 3 months.          ______________________________ Hildred Laser, M.D.     NR/MEDQ  D:  03/15/2011  T:  03/16/2011  Job:  [id number]  cc:   Jerene Bears, MD Fax: [phone]  Bjorn Pippin, MD GI Liver Section [facility], [facility]  Electronically Signed by Hildred Laser M.D. on 03/28/2011 09:48:58 PM

## 2011-04-26 ENCOUNTER — Encounter (INDEPENDENT_AMBULATORY_CARE_PROVIDER_SITE_OTHER): Payer: Self-pay

## 2011-06-06 ENCOUNTER — Encounter (INDEPENDENT_AMBULATORY_CARE_PROVIDER_SITE_OTHER): Payer: Self-pay | Admitting: Internal Medicine

## 2011-06-06 ENCOUNTER — Ambulatory Visit (INDEPENDENT_AMBULATORY_CARE_PROVIDER_SITE_OTHER): Payer: PRIVATE HEALTH INSURANCE | Admitting: Internal Medicine

## 2011-06-06 VITALS — BP 116/78 | HR 74 | Temp 98.8°F | Resp 18 | Ht 63.0 in | Wt 232.0 lb

## 2011-06-06 DIAGNOSIS — K7460 Unspecified cirrhosis of liver: Secondary | ICD-10-CM

## 2011-06-06 NOTE — Progress Notes (Signed)
Presenting complaint; followup for chronic liver disease. Subjective; Maria [name] is a 55 year old Caucasian female with history of cirrhosis secondary to NAFLD complicated by variceal bleed status post esophageal variceal banding in November December 2011 and more recently on 03/15/2011. She has not bled since her initial episode last Thanksgiving. She also had a colonoscopy along with  EGD On 03/15/2011. She had 3 polyps removed and 2 were adenomatous. Patient has been evaluated at liver transplant center at [facility] in [facility]. Her next visit would be in but 6 months. She states she is getting her strength back. She is more active physically by her space and family activities. She is walking on a treadmill at least 4 times a week. Her goal is to walk ice day at least 5 days each week. She wants her beta blocker to be change since Corgard is not available anymore. She denies heartburn or dysphagia. She occasionally strangles on liquids. She denies abdominal pain melena or rectal bleeding. She has occasional hematochezia with straining. He says her appetite is fair and she has occasional nausea but no vomiting. She thought she had palpitations with propranolol Current medications; Current Outpatient Prescriptions on File Prior to Visit  Medication Sig Dispense Refill  . insulin lispro (HUMALOG) 100 UNIT/ML injection Inject into the skin 3 (three) times daily before meals. Patient is in the insulin pump. Rate of 5 units/hour except smaller dose at night per patient. Patient may bolus as needed.      Marland Kitchen levothyroxine (SYNTHROID, LEVOTHROID) 25 MCG tablet Take 25 mcg by mouth daily.        . metFORMIN (GLUMETZA) 500 MG (MOD) 24 hr tablet Take 500 mg by mouth 2 (two) times daily.       . pantoprazole (PROTONIX) 40 MG tablet Take 40 mg by mouth. Patient takes 1 in the morning and 1 at bedtime      . PARoxetine (PAXIL) 40 MG tablet Take 40 mg by mouth every morning.        . traMADol (ULTRAM) 50 MG  tablet Take 50 mg by mouth as needed.        Objective; BP 116/78  Pulse 74  Temp(Src) 98.8 F (37.1 C) (Oral)  Resp 18  Ht 5' 3"  (1.6 m)  Wt 232 lb (105.235 kg)  BMI 41.10 kg/m2 Her weight is down by 3 pounds since her last visit the No asterixis noted Conjunctiva is pink. Sclerae nonicteric. No neck masses or thyromegaly noted Cardiac exam with regular rhythm normal S1-S2. No murmur or gallop noted. Lungs are clear to auscultation Abdomen is obese but soft and nontender without organomegaly or masses. No peripheral edema noted. Lab data from 05/19/2011 TSH 1.33 WBC 5.9, hemoglobin 11.1, hematocrit 34.2, platelet count 52k Bilirubin 1.0, AP 91, AST 35, ALT 29, albumin 3.7, LC 9.1, creatinine 0.63, BUN 12, glucose 117. Patient's last hemoglobin A1c was 6.7 and her endocrinologist is is pleased with her glycemic control. Assessment; 1. Cirrhosis secondary to NAFLD complicated by variceal bleed in November 2011. She's undergone 3 sessions of banding and she has not had recurrence. She appears to be in stable mode but she must lose weight which I believe would slow down progression of her liver disease. #2. Thrombocytopenia secondary to cirrhosis. Plan; Stop Corgard Start propranolol 10 mg daily for 2 weeks and then 20 mg daily. She will call us if she experiences any side effects. Office visit in 4 months. She'll have alpha-fetoprotein prior to  next visit.

## 2011-06-06 NOTE — Patient Instructions (Signed)
Propranolol 10 mg daily for 2 weeks if no side effects increase to 20 mg every morning.

## 2011-09-20 ENCOUNTER — Encounter (INDEPENDENT_AMBULATORY_CARE_PROVIDER_SITE_OTHER): Payer: Self-pay | Admitting: *Deleted

## 2011-09-21 ENCOUNTER — Telehealth (INDEPENDENT_AMBULATORY_CARE_PROVIDER_SITE_OTHER): Payer: Self-pay | Admitting: *Deleted

## 2011-09-21 NOTE — Telephone Encounter (Signed)
Patient called to speak with Dr. Laural Golden. Offered her an appt with Terri and she refused. Told her she could speak with [name] or Terri and she refused. Patient only wants to talk with Dr. Laural Golden. The return phone number is [phone].

## 2011-09-22 ENCOUNTER — Telehealth (INDEPENDENT_AMBULATORY_CARE_PROVIDER_SITE_OTHER): Payer: Self-pay | Admitting: *Deleted

## 2011-09-22 DIAGNOSIS — K7460 Unspecified cirrhosis of liver: Secondary | ICD-10-CM

## 2011-09-22 DIAGNOSIS — K921 Melena: Secondary | ICD-10-CM

## 2011-09-22 NOTE — Telephone Encounter (Signed)
Talked with the patient; when she called yesterday she did not want to talk with [name] or [name] regarding her symptoms. She states she had one tarry stool blood now things are back to normal. She has an appointment on 10/03/2011. She'll have CBC, metabolic 7, serum albumin and AFB prior to that visit

## 2011-09-22 NOTE — Telephone Encounter (Signed)
Patient had called office on 09-20-10, refused to talk with me or Terri. Per Dr. Laural Golden the patient has a ov 10-03-11 and will need to have the above labs drawn prior to that visit. Labs are noted and faxed to [facility] upon patient request.

## 2011-09-25 NOTE — Telephone Encounter (Signed)
Labs have been noted and faxed.

## 2011-10-03 ENCOUNTER — Ambulatory Visit (INDEPENDENT_AMBULATORY_CARE_PROVIDER_SITE_OTHER): Payer: PRIVATE HEALTH INSURANCE

## 2011-10-09 ENCOUNTER — Encounter (INDEPENDENT_AMBULATORY_CARE_PROVIDER_SITE_OTHER): Payer: Self-pay | Admitting: Internal Medicine

## 2011-10-09 ENCOUNTER — Ambulatory Visit (INDEPENDENT_AMBULATORY_CARE_PROVIDER_SITE_OTHER): Payer: PRIVATE HEALTH INSURANCE | Admitting: Internal Medicine

## 2011-10-09 VITALS — BP 128/74 | HR 70 | Temp 98.4°F | Resp 14 | Ht 64.0 in | Wt 237.0 lb

## 2011-10-09 DIAGNOSIS — D696 Thrombocytopenia, unspecified: Secondary | ICD-10-CM

## 2011-10-09 DIAGNOSIS — E119 Type 2 diabetes mellitus without complications: Secondary | ICD-10-CM | POA: Insufficient documentation

## 2011-10-09 DIAGNOSIS — Z8719 Personal history of other diseases of the digestive system: Secondary | ICD-10-CM

## 2011-10-09 DIAGNOSIS — K219 Gastro-esophageal reflux disease without esophagitis: Secondary | ICD-10-CM

## 2011-10-09 DIAGNOSIS — R609 Edema, unspecified: Secondary | ICD-10-CM

## 2011-10-09 DIAGNOSIS — E8779 Other fluid overload: Secondary | ICD-10-CM

## 2011-10-09 DIAGNOSIS — K7460 Unspecified cirrhosis of liver: Secondary | ICD-10-CM

## 2011-10-09 DIAGNOSIS — D509 Iron deficiency anemia, unspecified: Secondary | ICD-10-CM | POA: Insufficient documentation

## 2011-10-09 MED ORDER — FERROUS SULFATE 325 (65 FE) MG PO TABS: 325.0000 mg | ORAL_TABLET | Freq: Every day | ORAL | Status: DC

## 2011-10-09 NOTE — Progress Notes (Signed)
Presenting complaint Followup for cirrhosis. Last visit 06/06/2011.  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 She has cirrhosis secondary to NAFLD which was diagnosed in November 2011 when she presented with esophageal variceal bleed and has undergone banding on 3 different location. First banding session was in November 2011 for variceal bleed. Second session was in December 2012 and the third one was in June 2012. She has not experienced any more episodes of bleed. In June 2012 she also underwent colonoscopy with removal of 2 small tubular adenomas. She has a brother who was diagnosed with colon carcinoma about 2 years ago at age 39 and now diagnosed with liver mets. She is getting on a treadmill at least 3 times a week and does 30 minutes each time. She runs it at 2.2 miles per hour and does not get fatigued no shortness of breath anymore. Few weeks ago she developed abdominal bloating and loose stools but these symptoms have completely resolved. She may have occasional gas pain. Now she is having normal stools and denies melena or rectal bleeding. Last month she had an episode of nosebleed and she also suffered renal colic and finally passed a kidney stone. In addition to walking or treadmill she also does lot or walking she was having some hoarseness and Dr. Woody Seller. increase her PPI to double dose she denies dysphagia or heartburn but at times feel tightness in her chest. She is taking furosemide no more than twice a week. She has not lost any weight since her last visit. She says her insulin dose has gone down and she is still having some low glucose levels. She has an appointment with her endocrinologist in March 2013 Current Medications: Current Outpatient Prescriptions  Medication Sig Dispense Refill  . furosemide (LASIX) 20 MG tablet Take 20 mg by mouth as needed.      . insulin lispro (HUMALOG) 100 UNIT/ML injection  Inject into the skin 3 (three) times daily before meals. Rate of 5 units/hour except smaller dose at night per patient. Patient may bolus as needed.      Marland Kitchen levothyroxine (SYNTHROID, LEVOTHROID) 25 MCG tablet Take 25 mcg by mouth daily.        . metFORMIN (GLUMETZA) 500 MG (MOD) 24 hr tablet Take 500 mg by mouth 2 (two) times daily.       Marland Kitchen MILK THISTLE PO Take by mouth 2 (two) times daily.        . pantoprazole (PROTONIX) 40 MG tablet Take 40 mg by mouth. Patient takes 1 in the morning and 1 at bedtime      . PARoxetine (PAXIL) 40 MG tablet Take 40 mg by mouth every morning.        . potassium chloride SA (K-DUR,KLOR-CON) 20 MEQ tablet Take 20 mEq by mouth. Takes when she takes Lasix       . propranolol (INDERAL) 10 MG tablet Take 10 mg by mouth daily.      . ramipril (ALTACE) 5 MG capsule Take 5 mg by mouth daily.        . traMADol (ULTRAM) 50 MG tablet Take 50 mg by mouth as needed.       . ferrous sulfate 325 (65 FE) MG tablet Take 1 tablet (325 mg total) by mouth daily.  100 tablet  2    Objective: BP 128/74  Pulse 70  Temp(Src) 98.4 F (36.9 C) (Oral)  Resp 14  Ht 5'  4" (1.626 m)  Wt 237 lb (107.502 kg)  BMI 40.68 kg/m2 She is alert and does not have asterixis. Conjunctiva is pink. Sclera is nonicteric Oral pharyngeal mucosa is normal. No neck masses or thyromegaly noted. Cardiac exam with regular rhythm normal S1 and S2. She has faint systolic ejection murmur best heard at LLSB. Lungs are clear to auscultation. Abdomen is full but soft and nontender; spleen tip is palpable. Liver edge is indistinct low RCM. No shifting dullness noted. No LE edema or clubbing noted.  Labs/studies Results: Lab data from 10/09/2011 glucose 88, BUN 13, creatinine 0.66, calcium 9.0, sodium 143, potassium 3.5, chloride 103, CO2 27, and serum albumin is 3.8. WBC 3.8 H&H is 10.6 and 32.3 MCV 75.3 and platelet count is 26K; moderate clumping noted. AFP is pending.   Assessment: #1. Cirrhosis  secondary to NAFLD complicated by esophageal variceal bleed. Status post 3 banding sessions in the last one was in June 2012. She is on low-dose beta blocker for secondary prevention. Serum albumin is normal. AFP is pending. She must continue make an effort in order to lose some weight. #2. Chronic GERD she is presently on double dose PPI and if her hoarseness does not improve in the next 4-8 weeks may consider switching her different PPI. #3. Iron deficiency anemia. H&H has dropped since her last visit and her MCV is also down. Suspect she has iron deficiency anemia most likely secondary to poor iron absorption in the setting of chronic PPI and she  needs to go back on iron. She had EGD and colonoscopy in July she had an there is no indication for given capsule study at this time.   Plan: Ferrous sulfate 325 mg by mouth twice a day with meal I will contact her with results of alpha-fetoprotein. Check her records at [facility] see when her last ultrasound was done and if it is more than 6 months she will need one now. She does CBC and be met in 3 months. She'll have CBC comprehensive chemistry panel and alpha-fetoprotein in 6 months prior to her office visit.

## 2011-10-09 NOTE — Patient Instructions (Signed)
Physician will call with results of alpha-fetoprotein. New medication is ferrous sulfate 325 mg twice daily with food. CBC and metabolic-7 in 3 months.

## 2011-11-02 ENCOUNTER — Telehealth (INDEPENDENT_AMBULATORY_CARE_PROVIDER_SITE_OTHER): Payer: Self-pay | Admitting: *Deleted

## 2011-11-02 DIAGNOSIS — D509 Iron deficiency anemia, unspecified: Secondary | ICD-10-CM

## 2011-11-02 NOTE — Telephone Encounter (Signed)
Per NUR the patient is to increase Iron to Bid and repeat lab in 4 weeks.  Lab noted for March 06-01-12. Patient will have lab done at [facility].

## 2011-11-16 ENCOUNTER — Encounter (INDEPENDENT_AMBULATORY_CARE_PROVIDER_SITE_OTHER): Payer: Self-pay | Admitting: *Deleted

## 2011-11-17 ENCOUNTER — Telehealth (INDEPENDENT_AMBULATORY_CARE_PROVIDER_SITE_OTHER): Payer: Self-pay | Admitting: *Deleted

## 2011-11-17 DIAGNOSIS — R7401 Elevation of levels of liver transaminase levels: Secondary | ICD-10-CM

## 2011-11-17 DIAGNOSIS — K7460 Unspecified cirrhosis of liver: Secondary | ICD-10-CM

## 2011-11-17 NOTE — Telephone Encounter (Signed)
Patient will need these labs in 6 months, they will need to be faxed to [facility]

## 2011-12-04 ENCOUNTER — Telehealth (INDEPENDENT_AMBULATORY_CARE_PROVIDER_SITE_OTHER): Payer: Self-pay | Admitting: *Deleted

## 2011-12-04 NOTE — Telephone Encounter (Signed)
Maria [name] from [facility] called with a Critical Result PLT - 20. He is sending hard copy of all her labs that are ready. Dr. Laural Golden is being made aware.

## 2011-12-05 NOTE — Telephone Encounter (Signed)
Results of blood work reviewed with patient. Platelet count of 20,000 is inaccurate secondary to clumping this is not a new problem.

## 2011-12-06 ENCOUNTER — Telehealth (INDEPENDENT_AMBULATORY_CARE_PROVIDER_SITE_OTHER): Payer: Self-pay | Admitting: *Deleted

## 2011-12-06 DIAGNOSIS — K7460 Unspecified cirrhosis of liver: Secondary | ICD-10-CM

## 2011-12-06 NOTE — Telephone Encounter (Signed)
Per Dr. Laural Golden the patient will need a CBC/D in 3 months. Las has been noted and the patient will go to [facility].

## 2011-12-08 ENCOUNTER — Encounter (INDEPENDENT_AMBULATORY_CARE_PROVIDER_SITE_OTHER): Payer: Self-pay | Admitting: *Deleted

## 2012-01-18 ENCOUNTER — Encounter (INDEPENDENT_AMBULATORY_CARE_PROVIDER_SITE_OTHER): Payer: Self-pay

## 2012-02-13 ENCOUNTER — Other Ambulatory Visit (INDEPENDENT_AMBULATORY_CARE_PROVIDER_SITE_OTHER): Payer: Self-pay | Admitting: Internal Medicine

## 2012-02-16 ENCOUNTER — Encounter (INDEPENDENT_AMBULATORY_CARE_PROVIDER_SITE_OTHER): Payer: Self-pay | Admitting: *Deleted

## 2012-02-16 ENCOUNTER — Other Ambulatory Visit (INDEPENDENT_AMBULATORY_CARE_PROVIDER_SITE_OTHER): Payer: Self-pay | Admitting: *Deleted

## 2012-02-16 DIAGNOSIS — K7460 Unspecified cirrhosis of liver: Secondary | ICD-10-CM

## 2012-04-08 ENCOUNTER — Encounter (INDEPENDENT_AMBULATORY_CARE_PROVIDER_SITE_OTHER): Payer: Self-pay | Admitting: *Deleted

## 2012-04-08 ENCOUNTER — Other Ambulatory Visit (INDEPENDENT_AMBULATORY_CARE_PROVIDER_SITE_OTHER): Payer: Self-pay | Admitting: *Deleted

## 2012-04-08 ENCOUNTER — Encounter (INDEPENDENT_AMBULATORY_CARE_PROVIDER_SITE_OTHER): Payer: Self-pay | Admitting: Internal Medicine

## 2012-04-08 ENCOUNTER — Ambulatory Visit (INDEPENDENT_AMBULATORY_CARE_PROVIDER_SITE_OTHER): Payer: PRIVATE HEALTH INSURANCE | Admitting: Internal Medicine

## 2012-04-08 VITALS — BP 110/70 | HR 74 | Temp 98.8°F | Resp 20 | Ht 64.0 in | Wt 238.1 lb

## 2012-04-08 DIAGNOSIS — D509 Iron deficiency anemia, unspecified: Secondary | ICD-10-CM

## 2012-04-08 DIAGNOSIS — K7460 Unspecified cirrhosis of liver: Secondary | ICD-10-CM

## 2012-04-08 DIAGNOSIS — F419 Anxiety disorder, unspecified: Secondary | ICD-10-CM

## 2012-04-08 DIAGNOSIS — I8500 Esophageal varices without bleeding: Secondary | ICD-10-CM

## 2012-04-08 DIAGNOSIS — F411 Generalized anxiety disorder: Secondary | ICD-10-CM

## 2012-04-08 DIAGNOSIS — K766 Portal hypertension: Secondary | ICD-10-CM

## 2012-04-08 MED ORDER — PROPRANOLOL HCL 20 MG PO TABS: 20.0000 mg | ORAL_TABLET | Freq: Two times a day (BID) | ORAL | Status: DC

## 2012-04-08 MED ORDER — PROPRANOLOL HCL 20 MG PO TABS: 20.0000 mg | ORAL_TABLET | Freq: Three times a day (TID) | ORAL | Status: DC

## 2012-04-08 MED ORDER — PAROXETINE HCL 40 MG PO TABS: 20.0000 mg | ORAL_TABLET | ORAL | Status: DC

## 2012-04-08 MED ORDER — FERROUS SULFATE 325 (65 FE) MG PO TABS: 325.0000 mg | ORAL_TABLET | ORAL | Status: DC

## 2012-04-08 NOTE — Patient Instructions (Addendum)
Notify if you have problems with the higher dose of propranolol. Paxil dose reduce to 20 mg daily. Note Ramipril is being discontinued. Esophagogastroduodenoscopy to be scheduled

## 2012-04-08 NOTE — Progress Notes (Signed)
Presenting complaint;  Follow for chronic liver disease.  Subjective:  Maria [name] is a 56 year old Caucasian female who has cirrhosis secondary to NAFLD complicated by esophageal variceal bleed in November 2011. She underwent banding in November 2012 again in December 2012 and more recently in June 2012. Three small polyps removed and these were tubular adenomas. Two  smaller polyps were coagulated. She was last seen in January 2013. She has been getting her blood work periodically. She saw Dr. Lurlean Leyden at [facility] last week. She had abdominopelvic CT which is negative for focal abnormalities in the liver. She has small umbilicus hernia and splenic and gastroesophageal varices. No ascites was noted. Serum potassium was 3.4. She is taking potassium on daily basis now. He takes furosemide on as-needed basis. Her MELD score was 8. She was told to return to clinic if her condition worsens. She states her diabetes is well controlled. This is managed by Dr. Bubba Camp.  She has good appetite. She denies melena or rectal bleeding. Her bowels move daily or every other day. She denies abdominal pain nausea vomiting. She raises a garden but she is not doing any regular exercise or walking even though she has a treadmill at home. Dr. Lurlean Leyden recommended increasing her beta blocker since her heart rate is still above 70. Patient would like to decrease Paxil dose as stress level has decreased.   Current Medications: Current Outpatient Prescriptions  Medication Sig Dispense Refill  . ferrous sulfate 325 (65 FE) MG tablet Take 1 tablet (325 mg total) by mouth daily.  100 tablet  2  . furosemide (LASIX) 20 MG tablet TAKE 1 TABLET EVERY DAY AS NEEDED FOR SEVERE LEG SWELLING  302 tablet  2  . insulin lispro (HUMALOG) 100 UNIT/ML injection Inject into the skin 3 (three) times daily before meals. Rate of 5 units/hour except smaller dose at night per patient. Patient may bolus as needed.      Marland Kitchen levothyroxine (SYNTHROID,  LEVOTHROID) 25 MCG tablet Take 25 mcg by mouth daily.        . metFORMIN (GLUMETZA) 500 MG (MOD) 24 hr tablet Take 500 mg by mouth 2 (two) times daily.       Marland Kitchen MILK THISTLE PO Take by mouth 2 (two) times daily.        . pantoprazole (PROTONIX) 40 MG tablet Take 40 mg by mouth. Patient takes 1 in the morning and 1 at bedtime      . PARoxetine (PAXIL) 40 MG tablet Take 40 mg by mouth every morning.        . potassium chloride SA (K-DUR,KLOR-CON) 20 MEQ tablet Take 20 mEq by mouth. Takes when she takes Lasix       . propranolol (INDERAL) 10 MG tablet Take 20 mg by mouth daily.       . ramipril (ALTACE) 5 MG capsule Take 2.5 mg by mouth daily.          Objective: Blood pressure 110/70, pulse 74, temperature 98.8 F (37.1 C), temperature source Oral, resp. rate 20, height 5' 4"  (1.626 m), weight 238 lb 1.6 oz (108.001 kg). Patient is alert and does not have asterixis Conjunctiva is pink. Sclera is nonicteric Oropharyngeal mucosa is normal. No neck masses or thyromegaly noted. Cardiac exam with regular rhythm normal S1 and S2. Grade 2/6 systolic ejection murmur best heard at LLSB. Lungs are clear to auscultation. Abdomen is full, soft and nontender. Difficult to feel her spleen. Liver edge is indistinct.  No LE edema or clubbing noted.  Labs/studies Results: From 04/03/2012. WBC 4.2, hemoglobin 12.9, hematocrit 38, platelet count could not be determined because of clumping. Sodium 140, potassium 3.4, chloride 106, CO2 25, glucose 129, BUN 8, creatinine 0.67. Total bilirubin oh 0.9, AP 94, AST 28, ALT 29 albumin 3.9 calcium 9.2. INR 1.2. Abdominopelvic CT performed 04/03/2012 at [facility]; results as above  Assessment:  #1. Cirrhosis secondary to NAFLD complicated by esophageal variceal bleed. Her last banding session was in June 2012. She should undergo EGD and banding if she has a recurrent esophageal varices. #2. Iron deficiency anemia. H&H is not normal. Anemia felt to be multifactorial. She  can drop iron dose. #3. Obesity.  Patient must increase level of activity so that she can bring her BMI down and slow down progression of her liver disease.   Plan:  Discontinue ramipril. Increase propranolol to 20 mg by mouth twice a day. Decrease Paxil to 20 mg by mouth daily. Decrease iron pills to every 3 days. Esophagogastroduodenoscopy and possible esophageal variceal banding to be scheduled. Office visit in 4 months.

## 2012-04-09 ENCOUNTER — Encounter (HOSPITAL_COMMUNITY): Payer: Self-pay | Admitting: Pharmacy Technician

## 2012-04-23 MED ORDER — SODIUM CHLORIDE 0.45 % IV SOLN
Freq: Once | INTRAVENOUS | Status: AC
  Administered 2012-04-24: 1000 mL via INTRAVENOUS

## 2012-04-24 ENCOUNTER — Encounter (HOSPITAL_COMMUNITY)
Admission: RE | Disposition: A | Discharge status: Home / Self Care | Payer: Self-pay | Source: Ambulatory Visit | Attending: Internal Medicine

## 2012-04-24 ENCOUNTER — Ambulatory Visit (HOSPITAL_COMMUNITY)
Admission: RE | Admit: 2012-04-24 | Discharge: 2012-04-24 | Disposition: A | Discharge status: Home / Self Care | Payer: PRIVATE HEALTH INSURANCE | Source: Ambulatory Visit | Attending: Internal Medicine | Admitting: Internal Medicine

## 2012-04-24 ENCOUNTER — Encounter (HOSPITAL_COMMUNITY): Payer: Self-pay | Admitting: *Deleted

## 2012-04-24 DIAGNOSIS — K31819 Angiodysplasia of stomach and duodenum without bleeding: Secondary | ICD-10-CM

## 2012-04-24 DIAGNOSIS — K7460 Unspecified cirrhosis of liver: Secondary | ICD-10-CM | POA: Insufficient documentation

## 2012-04-24 DIAGNOSIS — K766 Portal hypertension: Secondary | ICD-10-CM

## 2012-04-24 DIAGNOSIS — Z01812 Encounter for preprocedural laboratory examination: Secondary | ICD-10-CM | POA: Insufficient documentation

## 2012-04-24 DIAGNOSIS — J3489 Other specified disorders of nose and nasal sinuses: Secondary | ICD-10-CM

## 2012-04-24 DIAGNOSIS — E119 Type 2 diabetes mellitus without complications: Secondary | ICD-10-CM | POA: Insufficient documentation

## 2012-04-24 DIAGNOSIS — Z8719 Personal history of other diseases of the digestive system: Secondary | ICD-10-CM

## 2012-04-24 DIAGNOSIS — Z794 Long term (current) use of insulin: Secondary | ICD-10-CM | POA: Insufficient documentation

## 2012-04-24 DIAGNOSIS — I8500 Esophageal varices without bleeding: Secondary | ICD-10-CM

## 2012-04-24 DIAGNOSIS — I8510 Secondary esophageal varices without bleeding: Secondary | ICD-10-CM | POA: Insufficient documentation

## 2012-04-24 DIAGNOSIS — K319 Disease of stomach and duodenum, unspecified: Secondary | ICD-10-CM | POA: Insufficient documentation

## 2012-04-24 HISTORY — PX: ESOPHAGOGASTRODUODENOSCOPY: SHX5428

## 2012-04-24 HISTORY — PX: ESOPHAGEAL BANDING: SHX5518

## 2012-04-24 LAB — GLUCOSE, CAPILLARY: Glucose-Capillary: 122 mg/dL — ABNORMAL HIGH (ref 70–99)

## 2012-04-24 SURGERY — EGD (ESOPHAGOGASTRODUODENOSCOPY)
Anesthesia: Moderate Sedation

## 2012-04-24 MED ORDER — STERILE WATER FOR IRRIGATION IR SOLN
Status: DC | PRN
  Administered 2012-04-24: 14:00:00

## 2012-04-24 MED ORDER — MIDAZOLAM HCL 5 MG/5ML IJ SOLN
INTRAMUSCULAR | Status: AC
  Filled 2012-04-24: qty 10

## 2012-04-24 MED ORDER — MEPERIDINE HCL 25 MG/ML IJ SOLN
INTRAMUSCULAR | Status: DC | PRN
  Administered 2012-04-24 (×2): 25 mg via INTRAVENOUS

## 2012-04-24 MED ORDER — BUTAMBEN-TETRACAINE-BENZOCAINE 2-2-14 % EX AERO
INHALATION_SPRAY | CUTANEOUS | Status: DC | PRN
  Administered 2012-04-24: 2 via TOPICAL

## 2012-04-24 MED ORDER — MEPERIDINE HCL 50 MG/ML IJ SOLN
INTRAMUSCULAR | Status: AC
  Filled 2012-04-24: qty 1

## 2012-04-24 MED ORDER — MIDAZOLAM HCL 5 MG/5ML IJ SOLN
INTRAMUSCULAR | Status: DC | PRN
  Administered 2012-04-24 (×5): 2 mg via INTRAVENOUS

## 2012-04-24 NOTE — H&P (Signed)
Maria [name] is an 56 y.o. female.   Chief Complaint: Patient is here for esophagogastroduodenoscopy and possible esophageal variceal banding. HPI: Patient is 56 year old Caucasian female with cirrhosis secondary to NAFLD complicated by esophageal variceal bleed. She presented in November 2011. She has had 3 banding sessions altogether. Last exam was in June 2012. He denies abdominal pain heartburn or dysphagia.  Past Medical History  Diagnosis Date  . Cirrhosis   . NAFLD (nonalcoholic fatty liver disease)   . Esophageal bleed, non-variceal   . Hypertension   . Hypothyroidism   . Fibromyalgia   . Osteoarthrosis   . Diabetes mellitus   . UTI (urinary tract infection) 11/12 end of the month    Patient feels that she passed a kidney stone at that time    Past Surgical History  Procedure Date  . Esophagogastroduodenoscopy w/ banding 08/2010  . Appendectomy 1980  . Tonsillectomy   . Bilateral salpingoophorectomy   . Vaginal hysterectomy   . Colonoscopy 03/15/2011  . Upper gastrointestinal endoscopy 03/15/2011    EGD ED BANDING/TCS  . Upper gastrointestinal endoscopy 09/05/2010  . Upper gastrointestinal endoscopy 08/11/2010    Family History  Problem Relation Age of Onset  . Lung cancer Mother   . Diabetes Father   . Diabetes Sister   . Hypertension Sister   . Hypothyroidism Sister   . Colon cancer Brother   . Diabetes Sister   . Hypothyroidism Brother   . Healthy Daughter   . Obesity Daughter   . Hypertension Daughter    Social History:  reports that she has never smoked. She has never used smokeless tobacco. She reports that she does not drink alcohol or use illicit drugs.  Allergies: No Known Allergies  Medications Prior to Admission  Medication Sig Dispense Refill  . ferrous sulfate 325 (65 FE) MG tablet Take 325 mg by mouth 2 (two) times a week.      . furosemide (LASIX) 20 MG tablet Take 20 mg by mouth once a week.      . insulin lispro (HUMALOG) 100 UNIT/ML  injection Inject into the skin 3 (three) times daily before meals. Rate of 5 units/hour except smaller dose at night per patient. Patient may bolus as needed.      Marland Kitchen levothyroxine (SYNTHROID, LEVOTHROID) 25 MCG tablet Take 25 mcg by mouth daily.        . metFORMIN (GLUCOPHAGE) 500 MG tablet Take 500 mg by mouth 2 (two) times daily.      Marland Kitchen MILK THISTLE PO Take by mouth 2 (two) times daily.        . pantoprazole (PROTONIX) 40 MG tablet Take 40 mg by mouth 2 (two) times daily.      Marland Kitchen PARoxetine (PAXIL) 20 MG tablet Take 20 mg by mouth daily.      . potassium chloride SA (K-DUR,KLOR-CON) 20 MEQ tablet Take 20 mEq by mouth once a week. Takes when she takes lasix      . propranolol (INDERAL) 20 MG tablet Take 1 tablet (20 mg total) by mouth 2 (two) times daily.  60 tablet  5  . vitamin E 400 UNIT capsule Take 400 Units by mouth daily.      . furosemide (LASIX) 20 MG tablet TAKE 1 TABLET EVERY DAY AS NEEDED FOR SEVERE LEG SWELLING  302 tablet  2    Results for orders placed during the hospital encounter of 04/24/12 (from the past 48 hour(s))  GLUCOSE, CAPILLARY     Status: Abnormal  Collection Time   04/24/12  1:45 PM      Component Value Range Comment   Glucose-Capillary 122 (*) 70 - 99 mg/dL    No results found.  ROS  Blood pressure 137/68, pulse 70, temperature 97.8 F (36.6 C), temperature source Oral, resp. rate 20, height 5' 4"  (1.626 m), weight 238 lb (107.956 kg), SpO2 94.00%. Physical Exam  Constitutional: She appears well-developed and well-nourished.  HENT:  Mouth/Throat: Oropharynx is clear and moist.  Eyes: Conjunctivae are normal. No scleral icterus.  Neck: No thyromegaly present.  Cardiovascular: Normal rate, regular rhythm and normal heart sounds.   No murmur heard. Respiratory: Effort normal and breath sounds normal.  GI: Soft. She exhibits no distension and no mass. There is no tenderness.  Musculoskeletal: She exhibits no edema.  Lymphadenopathy:    She has no cervical  adenopathy.  Neurological: She is alert.  Skin: Skin is warm and dry.     Assessment/Plan Cirrhosis sec to NAFLD. History of esophageal variceal bleed. EGD and possible esophageal variceal banding.  Jaloni Davoli U 04/24/2012, 2:30 PM

## 2012-04-24 NOTE — Op Note (Signed)
EGD PROCEDURE REPORT  PATIENT:  Maria [name]  MR#:  [id number] Birthdate:  10/23/1955, 56 y.o., female Endoscopist:  Dr. [name], MD Referred By:  Dr. Glenda Chroman, MD  Procedure Date: 04/24/2012  Procedure:   EGD  Indications:  Patient is 56 year old Caucasian female with history of esophageal variceal bleed secondary to cirrhosis secondary to NAFLD who is here for elective EGD and banding for recurrent varices. She has done well since her initial presentation in November 2011 with esophageal variceal bleed.           Informed Consent:  The risks, benefits, alternatives & imponderables which include, but are not limited to, bleeding, infection, perforation, drug reaction and potential missed lesion have been reviewed.  The potential for biopsy, lesion removal, esophageal dilation, etc. have also been discussed.  Questions have been answered.  All parties agreeable.  Please see history & physical in medical record for more information.  Medications:  Demerol 50 mg IV Versed 55m IV Cetacaine spray topically for oropharyngeal anesthesia  Description of procedure:  The endoscope was introduced through the mouth and advanced to the second portion of the duodenum without difficulty or limitations. The mucosal surfaces were surveyed very carefully during advancement of the scope and upon withdrawal.  Findings:  Esophagus:  Mucosa of the proximal and middle segment was normal. Mucosa in the distal 4-5 cm reveal small varices and variceal cords from previous banding. None of these varices was large enough to banded. GEJ:  38 cm Stomach:  Stomach was empty and distended very well with insufflation. Folds in the proximal stomach are normal. Examination mucosa at body revealed mosaic pattern or snakeskinning consistent with portal gastropathy. There was nodularity to antral mucosa with multiple telangiectasia. None with stigmata of bleed bloating channel was patent. No fundal varices were  identified and annularis was unremarkable. Duodenum:  Normal bulbar and post bulbar mucosa.  Therapeutic/Diagnostic Maneuvers Performed:  None  Complications:  None  Impression: Small esophageal varices not large enough to be banded. Esophageal scarring from previous banding. Portal gastropathy. Gastric antral vascular ectasia. Nodular antral mucosa unchanged from EGD of one year ago.  Recommendations:  Standard instructions given. Office visit in November 2013. Next EGD in 6 months with propofol as she is difficult to sedate.  Sharol Croghan U  04/24/2012  2:58 PM  CC: Dr. VGlenda Chroman, MD & Dr. NRayne Duref. provider found

## 2012-04-26 ENCOUNTER — Encounter (HOSPITAL_COMMUNITY): Payer: Self-pay | Admitting: Internal Medicine

## 2012-04-30 ENCOUNTER — Encounter (INDEPENDENT_AMBULATORY_CARE_PROVIDER_SITE_OTHER): Payer: Self-pay

## 2012-05-16 ENCOUNTER — Other Ambulatory Visit (INDEPENDENT_AMBULATORY_CARE_PROVIDER_SITE_OTHER): Payer: Self-pay | Admitting: *Deleted

## 2012-05-16 ENCOUNTER — Encounter (INDEPENDENT_AMBULATORY_CARE_PROVIDER_SITE_OTHER): Payer: Self-pay | Admitting: *Deleted

## 2012-05-16 DIAGNOSIS — K7460 Unspecified cirrhosis of liver: Secondary | ICD-10-CM

## 2012-05-16 DIAGNOSIS — R7401 Elevation of levels of liver transaminase levels: Secondary | ICD-10-CM

## 2012-07-04 ENCOUNTER — Encounter (INDEPENDENT_AMBULATORY_CARE_PROVIDER_SITE_OTHER): Payer: Self-pay

## 2012-07-08 ENCOUNTER — Encounter (INDEPENDENT_AMBULATORY_CARE_PROVIDER_SITE_OTHER): Payer: Self-pay

## 2012-07-23 ENCOUNTER — Telehealth (INDEPENDENT_AMBULATORY_CARE_PROVIDER_SITE_OTHER): Payer: Self-pay | Admitting: *Deleted

## 2012-07-23 NOTE — Telephone Encounter (Signed)
Patient has an apt scheduled for 08/06/12 and would like to know if she needs to have lab work done? The return phone number is [phone].

## 2012-07-26 NOTE — Telephone Encounter (Signed)
Per dr.Rehman may get CBC/Diff , C-Met and AFP if it has been 6 months since drawn. Last AFP was drawn 05-23-12. Patient made aware and lab was faxed to [facility]

## 2012-08-03 HISTORY — PX: OTHER SURGICAL HISTORY: SHX169

## 2012-08-04 ENCOUNTER — Telehealth (INDEPENDENT_AMBULATORY_CARE_PROVIDER_SITE_OTHER): Payer: Self-pay | Admitting: Internal Medicine

## 2012-08-04 NOTE — Telephone Encounter (Signed)
Patient called yesterday morning stating that she was having hematemesis. She was advised to go to nearest hospital. She went to [facility] in [facility] and underwent esophageal variceal banding and is doing well according to her daughter.

## 2012-08-06 ENCOUNTER — Ambulatory Visit (INDEPENDENT_AMBULATORY_CARE_PROVIDER_SITE_OTHER): Payer: PRIVATE HEALTH INSURANCE | Admitting: Internal Medicine

## 2012-08-07 ENCOUNTER — Telehealth (INDEPENDENT_AMBULATORY_CARE_PROVIDER_SITE_OTHER): Payer: Self-pay | Admitting: *Deleted

## 2012-08-07 NOTE — Telephone Encounter (Signed)
Talked with patient's daughter Cecille Rubin; Patient may have developed encephalopathy secondary to GI bleed and acute decompensation. Her platelet count was 18,000. I will call Dr. Dianah Field her gastroenterologist in a.m.

## 2012-08-07 NOTE — Telephone Encounter (Signed)
Dr.Rehman called and this was discussed with him. He plans to call the patient and talk with her. The Doctor's there will need to contact him.

## 2012-08-07 NOTE — Telephone Encounter (Signed)
Olivia Mackie called and states the following. Mother is in the [facility], she had 6 varices banded , the other day she rec'd 2 units of blood. They are planning to infuse her with 2 more units today. Olivia Mackie states that her mother is loopy and is having trouble explaining to the doctors there what is going on about her medical history. An example her PLT's. The Physicians there are telling they have contacted [place] but as to date we have rec'd no calls from them. They daughter on behalf of her mother is requesting the following: 1- call Dr. Park Meo, [phone] and discuss patient's condition. 2- call [place] at the hospital [phone] room [place] is on her way down there and she ask if she or her sister need to be contacted to call Cecille Rubin @ [phone] as she will be driving. Forwarded to [place] for review

## 2012-08-13 ENCOUNTER — Telehealth (INDEPENDENT_AMBULATORY_CARE_PROVIDER_SITE_OTHER): Payer: Self-pay | Admitting: *Deleted

## 2012-08-13 NOTE — Telephone Encounter (Signed)
Patient called, stating [name] called and left message on her daughter's phone last night wanting to know how she was doing. Patient states she is home now, her number is [phone],

## 2012-08-13 NOTE — Telephone Encounter (Signed)
I will address this with Dr.Rehman when he calls the office.

## 2012-08-14 ENCOUNTER — Telehealth (INDEPENDENT_AMBULATORY_CARE_PROVIDER_SITE_OTHER): Payer: Self-pay | Admitting: *Deleted

## 2012-08-14 NOTE — Telephone Encounter (Signed)
Spoke with Dr.Rehman and he ask that I call the patient and ask that she her Endocrinologist about weight loss program. She needs to be less that 200 pounds but due to her diabetes he does not want her to become hyperglycemic. She will need a December appointment with Dr.Rehman. Per NUR.  I called the patient and discussed the above. She states that per the doctors at [facility] she would need to have a rebanding  , and she feels that would be around 08-19-12. She states that she also will need lab work. Kelleen requested that they send to our office all of her hospital information, she states that they would not take any of the inforamtion. They recommended that she read a book called Wheat Belly written by a Cardiologist. She says that she has been putting some of what she read to use. Blood sugars have ben low. The endocrinologist has suggested in the past the banding procedure.  Patient advised that I would speak with Dr.Rehman on 08/19/12, and call her back.

## 2012-08-20 NOTE — Telephone Encounter (Signed)
Per Dr.Rehman the patient will need to have an appointment next week (12-09/12-10) with him. At this time he will discuss what needs to be done and we can also have the patient sign a release so that we may obtain her medical information/care from [facility] in [place].

## 2012-08-20 NOTE — Telephone Encounter (Signed)
Apt has been scheduled for 08/26/12 with Dr. Laural Golden.

## 2012-08-20 NOTE — Telephone Encounter (Signed)
Taken care of

## 2012-08-26 ENCOUNTER — Ambulatory Visit (INDEPENDENT_AMBULATORY_CARE_PROVIDER_SITE_OTHER): Payer: PRIVATE HEALTH INSURANCE | Admitting: Internal Medicine

## 2012-08-26 ENCOUNTER — Encounter (INDEPENDENT_AMBULATORY_CARE_PROVIDER_SITE_OTHER): Payer: Self-pay | Admitting: Internal Medicine

## 2012-08-26 ENCOUNTER — Other Ambulatory Visit (INDEPENDENT_AMBULATORY_CARE_PROVIDER_SITE_OTHER): Payer: Self-pay | Admitting: *Deleted

## 2012-08-26 ENCOUNTER — Encounter (INDEPENDENT_AMBULATORY_CARE_PROVIDER_SITE_OTHER): Payer: Self-pay | Admitting: *Deleted

## 2012-08-26 VITALS — BP 118/70 | HR 74 | Temp 97.8°F | Resp 20 | Ht 64.0 in | Wt 237.6 lb

## 2012-08-26 DIAGNOSIS — K7460 Unspecified cirrhosis of liver: Secondary | ICD-10-CM

## 2012-08-26 DIAGNOSIS — F411 Generalized anxiety disorder: Secondary | ICD-10-CM

## 2012-08-26 DIAGNOSIS — Z8719 Personal history of other diseases of the digestive system: Secondary | ICD-10-CM

## 2012-08-26 DIAGNOSIS — F419 Anxiety disorder, unspecified: Secondary | ICD-10-CM

## 2012-08-26 DIAGNOSIS — I8500 Esophageal varices without bleeding: Secondary | ICD-10-CM

## 2012-08-26 MED ORDER — PAROXETINE HCL 20 MG PO TABS: 40.0000 mg | ORAL_TABLET | Freq: Every day | ORAL | Status: DC

## 2012-08-26 NOTE — Progress Notes (Signed)
Presenting complaint;  Follow for complicated cirrhosis and recent esophageal variceal bleed.  Subjective:  Patient is 56 year old Caucasian female who has cirrhosis secondary to NAFLD who initially presented in November 2011 with upper GI bleed and underwent esophageal variceal banding. Subsequent sessions but in December 2011 in June 2012. Last EGD was in July 2013 only small varices were identified. Patient was on vacation at [facility] along with her family members when she developed upper GI bleed. She was hospitalized at [facility] from 08/03/2012 to 08/09/2012. She received total of 4 units of PRBCs. Predischarge hemoglobin was 10.4. Four columns were banded and 6 bands were applied altogether. She experienced some chest discomfort for few days but this symptom has resolved. She denies abdominal pain melena or rectal bleeding. She is trying to exercise and walk on a treadmill. She is having hypoglycemic episodes and has an appointment with an endocrinologist to reevaluate insulin dosing. Patient feels Paxil at current doses not working and she would like to go back to 40 mg per day. Dose was decreased from 40 tom20 mg per day per my recommendations because of underlying liver disease.  Current Medications: Current Outpatient Prescriptions  Medication Sig Dispense Refill  . Cholecalciferol (VITAMIN D) 400 UNITS capsule Take 400 Units by mouth daily.      . ferrous sulfate 325 (65 FE) MG tablet Take 325 mg by mouth 2 (two) times a week.      . furosemide (LASIX) 20 MG tablet TAKE 1 TABLET EVERY DAY AS NEEDED FOR SEVERE LEG SWELLING  302 tablet  2  . furosemide (LASIX) 20 MG tablet Take 20 mg by mouth once a week.      . insulin lispro (HUMALOG) 100 UNIT/ML injection Inject into the skin 3 (three) times daily before meals. Rate of 5 units/hour except smaller dose at night per patient. Patient may bolus as needed.      Marland Kitchen levothyroxine (SYNTHROID, LEVOTHROID) 25 MCG  tablet Take 25 mcg by mouth daily.        . metFORMIN (GLUCOPHAGE) 500 MG tablet Take 500 mg by mouth 2 (two) times daily.      . pantoprazole (PROTONIX) 40 MG tablet Take 40 mg by mouth 2 (two) times daily.      Marland Kitchen PARoxetine (PAXIL) 20 MG tablet Take 20 mg by mouth daily.      . potassium chloride SA (K-DUR,KLOR-CON) 20 MEQ tablet Take 20 mEq by mouth once a week. Takes when she takes lasix      . propranolol (INDERAL) 20 MG tablet Take 1 tablet (20 mg total) by mouth 2 (two) times daily.  60 tablet  5  . vitamin E 400 UNIT capsule Take 400 Units by mouth daily.      Marland Kitchen MILK THISTLE PO Take by mouth 2 (two) times daily.           Objective: Blood pressure 118/70, pulse 74, temperature 97.8 F (36.6 C), temperature source Oral, resp. rate 20, height 5' 4"  (1.626 m), weight 237 lb 9.6 oz (107.775 kg). Patient is alert and does not have asterixis. Conjunctiva is pink. Sclera is nonicteric Oropharyngeal mucosa is normal. No neck masses or thyromegaly noted. Cardiac exam with regular rhythm normal S1 and S2. No murmur or gallop noted. Lungs are clear to auscultation. Abdomen is full but soft and nontender; no splenomegaly or hepatomegaly noted. No LE edema or clubbing noted.   Assessment:  #1. Recent hospitalization for esophageal variceal bleed. It is  intriguing to note that her first bleed was also in November 2 years ago. She has done well since her last banding 3 weeks ago. She will need followup EGD to treat residual or recurrent varices. #2. Cirrhosis secondary to NAFLD complicated by esophageal variceal bleed as above. If somehow she can lose weight and exercise regularly she may be able alter course of her liver disease. Now that she has had second episode of bleeding she seem very motivated. She will need to discuss diabetic management with her endocrinologist so that she would not have hypoglycemic spells with decreased calorie intake. #3. Thrombocytopenia. Count is is actually  higher than it is reported. She always has platelet clumping. #4. Symptoms of anxiety not well controlled with current dose of Paxil.   Plan: Request records from [facility]. Will check CBC and metabolic 7 today. Will schedule her for EGD and possible esophageal variceal banding with propofol since she is difficult to sedate with conscious sedation. Increase Paxil to 40 mg by mouth every morning new prescription sent to her pharmacy. Next alpha-fetoprotein and hepatic ultrasound would be in February 2014. Office visit in 3 months

## 2012-08-26 NOTE — Patient Instructions (Signed)
EGD to be scheduled. Physician will contact you with results of blood work.

## 2012-08-30 ENCOUNTER — Encounter (HOSPITAL_COMMUNITY): Payer: Self-pay | Admitting: Pharmacy Technician

## 2012-09-13 ENCOUNTER — Encounter (INDEPENDENT_AMBULATORY_CARE_PROVIDER_SITE_OTHER): Payer: Self-pay

## 2012-09-17 ENCOUNTER — Encounter (HOSPITAL_COMMUNITY): Payer: Self-pay

## 2012-09-17 ENCOUNTER — Encounter (HOSPITAL_COMMUNITY)
Admission: RE | Admit: 2012-09-17 | Discharge: 2012-09-17 | Disposition: A | Discharge status: Home / Self Care | Payer: PRIVATE HEALTH INSURANCE | Source: Ambulatory Visit | Attending: Internal Medicine | Admitting: Internal Medicine

## 2012-09-17 HISTORY — DX: Other specified postprocedural states: R11.2

## 2012-09-17 HISTORY — DX: Other specified postprocedural states: Z98.890

## 2012-09-17 NOTE — Pre-Procedure Instructions (Signed)
Pt to return 09/21/2012 for labs due to recent blood transfusion 08/03/2013

## 2012-09-17 NOTE — Patient Instructions (Addendum)
20    Your procedure is scheduled on: 09-24-2012  Report to [facility] at  6:15   AM.  Call this number if you have problems the morning of surgery: [phone]   Remember:   Do not drink or eat food:After Midnight.     Take these medicines the morning of surgery with A SIP OF WATER: Propranolol, Levothyroxine, Pantoprazole   Do not wear jewelry, make-up or nail polish.  Do not wear lotions, powders, or perfumes. You may wear deodorant.  Do not shave 48 hours prior to surgery. Men may shave face and neck.  Do not bring valuables to the hospital.  Contacts, dentures or bridgework may not be worn into surgery.  Leave suitcase in the car. After surgery it may be brought to your room.  For patients admitted to the hospital, checkout time is 11:00 AM the day of discharge.   Patients discharged the day of surgery will not be allowed to drive home.  Name and phone number of your driver:    Please read over the following fact sheets that you were given: Pain Booklet, Lab Information and Anesthesia Post-op Instructions   Endoscopy Care After Please read the instructions outlined below and refer to this sheet in the next few weeks. These discharge instructions provide you with general information on caring for yourself after you leave the hospital. Your doctor may also give you specific instructions. While your treatment has been planned according to the most current medical practices available, unavoidable complications occasionally occur. If you have any problems or questions after discharge, please call your doctor. HOME CARE INSTRUCTIONS Activity  You may resume your regular activity but move at a slower pace for the next 24 hours.   Take frequent rest periods for the next 24 hours.   Walking will help expel (get rid of) the air and reduce the bloated feeling in your abdomen.   No driving for 24 hours (because of the anesthesia (medicine) used during the test).   You may shower.   Do  not sign any important legal documents or operate any machinery for 24 hours (because of the anesthesia used during the test).  Nutrition  Drink plenty of fluids.   You may resume your normal diet.   Begin with a light meal and progress to your normal diet.   Avoid alcoholic beverages for 24 hours or as instructed by your caregiver.  Medications You may resume your normal medications unless your caregiver tells you otherwise. What you can expect today  You may experience abdominal discomfort such as a feeling of fullness or "gas" pains.   You may experience a sore throat for 2 to 3 days. This is normal. Gargling with salt water may help this.  Follow-up Your doctor will discuss the results of your test with you. SEEK IMMEDIATE MEDICAL CARE IF:  You have excessive nausea (feeling sick to your stomach) and/or vomiting.   You have severe abdominal pain and distention (swelling).   You have trouble swallowing.   You have a temperature over 100 F (37.8 C).   You have rectal bleeding or vomiting of blood.  Document Released: 04/18/2004 Document Revised: 08/24/2011 Document Reviewed: 10/30/2007  Esophageal Varices Esophageal varices are blood vessels in the esophagus (the tube that carries food to the stomach). Under normal circumstances, these blood vessels carry very small amounts of blood. If the liver is damaged, and the main vein (portal vein) that carries blood is blocked, larger amounts of blood might  back up into these esophageal varices. The esophageal varices are too fragile for this extra blood flow and pressure. They may swell and then break, causing life-threatening bleeding (hemorrhage). CAUSES  Any kind of liver disease can cause esophageal varices. Cirrhosis of the liver, usually due to alcoholism, is the most common reason. Other reasons include:  Severe heart failure: When the heart cannot pump blood around the body effectively enough, pressure may rise in the portal  vein.  A blood clot in the portal vein.  Sarcoidosis. This is an inflammatory disease that can affect the liver.  Schistosomiasis. A parasitic infection that can cause liver damage. SYMPTOMS  Symptoms may include:  Vomiting bright red or black coffee ground like material.  Black, tarry stools.  Low blood pressure.  Dizziness.  Loss of consciousness. DIAGNOSIS  When someone has known cirrhosis, their caregiver may screen them for the presence of esophageal varices. Tests that are used include:  Endoscopy (esophagogastroduodenoscopy or EGD). A thin, lighted tube is inserted through the mouth and into the esophagus. The caregiver will rate the varices according to their size and the presence of red streaks. These characteristics help determine the risk of bleeding.  Imaging tests. CT scans and MRI scans can both show esophageal varices. However, they cannot predict likelihood of bleeding. TREATMENT  There are different types of treatment used for esophageal varices. These include:  Variceal ligation. During EGD, the caregiver places a rubber band around the vein to prevent bleeding.  Injection therapy. During EGD, the caregiver can inject the veins with a solution that shrinks them and scars them closed.  Medications can decrease the pressure in the esophageal varices and prevent bleeding.  Balloon tamponade. A tube is put into the esophagus and a balloon is passed down it. When the balloon is inflated, it puts pressures on the veins and stops the bleeding.  Shunt. A small tube is placed within the liver veins. This decreases the blood flow and pressure to the varices, decreasing bleeding risk.  Liver transplant may be done as a last resort. HOME CARE INSTRUCTIONS   Take all medications exactly as directed.  Follow any prescribed diet. Avoid alcohol if recommended.  Follow instructions regarding both rest and physical activity.  Seek help to treat a drinking problem. SEEK  IMMEDIATE MEDICAL CARE IF:  You vomit blood or coffee-ground material.  You pass black tarry stools or bright red blood in the stools.  You are dizzy, lightheaded or faint.  You are unable to eat or drink.  You experience chest pain. Document Released: 11/25/2003 Document Revised: 11/27/2011 Document Reviewed: 11/05/2008 [facility] Patient Information 2013 [place]. PATIENT INSTRUCTIONS POST-ANESTHESIA  IMMEDIATELY FOLLOWING SURGERY:  Do not drive or operate machinery for the first twenty four hours after surgery.  Do not make any important decisions for twenty four hours after surgery or while taking narcotic pain medications or sedatives.  If you develop intractable nausea and vomiting or a severe headache please notify your doctor immediately.  FOLLOW-UP:  Please make an appointment with your surgeon as instructed. You do not need to follow up with anesthesia unless specifically instructed to do so.  WOUND CARE INSTRUCTIONS (if applicable):  Keep a dry clean dressing on the anesthesia/puncture wound site if there is drainage.  Once the wound has quit draining you may leave it open to air.  Generally you should leave the bandage intact for twenty four hours unless there is drainage.  If the epidural site drains for more than 36-48  hours please call the anesthesia department.  QUESTIONS?:  Please feel free to call your physician or the hospital operator if you have any questions, and they will be happy to assist you.

## 2012-09-23 ENCOUNTER — Encounter (HOSPITAL_COMMUNITY)
Admission: RE | Admit: 2012-09-23 | Discharge: 2012-09-23 | Disposition: A | Discharge status: Home / Self Care | Payer: PRIVATE HEALTH INSURANCE | Source: Ambulatory Visit | Attending: Internal Medicine | Admitting: Internal Medicine

## 2012-09-23 ENCOUNTER — Inpatient Hospital Stay (HOSPITAL_COMMUNITY): Admission: RE | Admit: 2012-09-23 | Payer: PRIVATE HEALTH INSURANCE | Source: Ambulatory Visit

## 2012-09-23 DIAGNOSIS — I8500 Esophageal varices without bleeding: Secondary | ICD-10-CM

## 2012-09-23 DIAGNOSIS — K7460 Unspecified cirrhosis of liver: Secondary | ICD-10-CM

## 2012-09-23 LAB — BASIC METABOLIC PANEL
BUN: 10 mg/dL (ref 6–23)
CO2: 25 mEq/L (ref 19–32)
Calcium: 9.1 mg/dL (ref 8.4–10.5)
Chloride: 106 mEq/L (ref 96–112)
Creatinine, Ser: 0.75 mg/dL (ref 0.50–1.10)
GFR calc Af Amer: >90 mL/min (ref >90)
GFR calc non Af Amer: >90 mL/min (ref >90)
Glucose, Bld: 193 mg/dL — ABNORMAL HIGH (ref 70–99)
Potassium: 3.8 mEq/L (ref 3.5–5.1)
Sodium: 140 mEq/L (ref 135–145)

## 2012-09-23 LAB — CBC
HCT: 38.1 % (ref 36.0–46.0)
Hemoglobin: 12.4 g/dL (ref 12.0–15.0)
MCH: 26.3 pg (ref 26.0–34.0)
MCHC: 32.5 g/dL (ref 30.0–36.0)
MCV: 80.9 fL (ref 78.0–100.0)
Platelets: DECREASED 10*3/uL (ref 150–400)
RBC: 4.71 MIL/uL (ref 3.87–5.11)
RDW: 16.5 % — ABNORMAL HIGH (ref 11.5–15.5)
WBC: 4.4 10*3/uL (ref 4.0–10.5)

## 2012-09-23 LAB — PREPARE RBC (CROSSMATCH)

## 2012-09-23 NOTE — OR Nursing (Signed)
Platelets per lab report states they are clumped.  Ann at office called , left message  For her to report this to Dr. Laural Golden to see if we needed to do any more lab work. Also reported this to Dr.  Patsey Berthold and he is aware and said to let Dr. Laural Golden know.

## 2012-09-24 ENCOUNTER — Encounter (HOSPITAL_COMMUNITY): Payer: Self-pay | Admitting: *Deleted

## 2012-09-24 ENCOUNTER — Ambulatory Visit (HOSPITAL_COMMUNITY): Payer: PRIVATE HEALTH INSURANCE | Admitting: Anesthesiology

## 2012-09-24 ENCOUNTER — Ambulatory Visit (HOSPITAL_COMMUNITY)
Admission: RE | Admit: 2012-09-24 | Discharge: 2012-09-24 | Disposition: A | Discharge status: Home / Self Care | Payer: PRIVATE HEALTH INSURANCE | Source: Ambulatory Visit | Attending: Internal Medicine | Admitting: Internal Medicine

## 2012-09-24 ENCOUNTER — Encounter (HOSPITAL_COMMUNITY)
Admission: RE | Disposition: A | Discharge status: Home / Self Care | Payer: Self-pay | Source: Ambulatory Visit | Attending: Internal Medicine

## 2012-09-24 ENCOUNTER — Encounter (HOSPITAL_COMMUNITY): Payer: Self-pay | Admitting: Anesthesiology

## 2012-09-24 DIAGNOSIS — E119 Type 2 diabetes mellitus without complications: Secondary | ICD-10-CM | POA: Insufficient documentation

## 2012-09-24 DIAGNOSIS — Z8719 Personal history of other diseases of the digestive system: Secondary | ICD-10-CM

## 2012-09-24 DIAGNOSIS — K7460 Unspecified cirrhosis of liver: Secondary | ICD-10-CM

## 2012-09-24 DIAGNOSIS — K319 Disease of stomach and duodenum, unspecified: Secondary | ICD-10-CM | POA: Insufficient documentation

## 2012-09-24 DIAGNOSIS — K766 Portal hypertension: Secondary | ICD-10-CM

## 2012-09-24 DIAGNOSIS — Z01810 Encounter for preprocedural cardiovascular examination: Secondary | ICD-10-CM | POA: Insufficient documentation

## 2012-09-24 DIAGNOSIS — Z01812 Encounter for preprocedural laboratory examination: Secondary | ICD-10-CM | POA: Insufficient documentation

## 2012-09-24 DIAGNOSIS — K31819 Angiodysplasia of stomach and duodenum without bleeding: Secondary | ICD-10-CM | POA: Insufficient documentation

## 2012-09-24 DIAGNOSIS — I8500 Esophageal varices without bleeding: Secondary | ICD-10-CM

## 2012-09-24 DIAGNOSIS — K7689 Other specified diseases of liver: Secondary | ICD-10-CM

## 2012-09-24 DIAGNOSIS — I8511 Secondary esophageal varices with bleeding: Secondary | ICD-10-CM | POA: Insufficient documentation

## 2012-09-24 HISTORY — PX: ESOPHAGEAL BANDING: SHX5518

## 2012-09-24 HISTORY — PX: ESOPHAGOGASTRODUODENOSCOPY (EGD) WITH PROPOFOL: SHX5813

## 2012-09-24 LAB — GLUCOSE, CAPILLARY
Glucose-Capillary: 172 mg/dL — ABNORMAL HIGH (ref 70–99)
Glucose-Capillary: 176 mg/dL — ABNORMAL HIGH (ref 70–99)

## 2012-09-24 SURGERY — ESOPHAGOGASTRODUODENOSCOPY (EGD) WITH PROPOFOL
Anesthesia: Monitor Anesthesia Care | Site: Esophagus

## 2012-09-24 MED ORDER — MIDAZOLAM HCL 2 MG/2ML IJ SOLN
1.0000 mg | INTRAMUSCULAR | Status: DC | PRN
  Administered 2012-09-24: 2 mg via INTRAVENOUS

## 2012-09-24 MED ORDER — LIDOCAINE HCL (PF) 1 % IJ SOLN
INTRAMUSCULAR | Status: AC
  Filled 2012-09-24: qty 5

## 2012-09-24 MED ORDER — ONDANSETRON HCL 4 MG/2ML IJ SOLN
4.0000 mg | Freq: Once | INTRAMUSCULAR | Status: AC
  Administered 2012-09-24: 4 mg via INTRAVENOUS

## 2012-09-24 MED ORDER — MIDAZOLAM HCL 2 MG/2ML IJ SOLN
INTRAMUSCULAR | Status: AC
  Filled 2012-09-24: qty 2

## 2012-09-24 MED ORDER — BUTAMBEN-TETRACAINE-BENZOCAINE 2-2-14 % EX AERO
2.0000 | INHALATION_SPRAY | Freq: Once | CUTANEOUS | Status: AC
  Administered 2012-09-24: 2 via TOPICAL
  Filled 2012-09-24: qty 56

## 2012-09-24 MED ORDER — DEXAMETHASONE SODIUM PHOSPHATE 4 MG/ML IJ SOLN
4.0000 mg | Freq: Once | INTRAMUSCULAR | Status: AC
  Administered 2012-09-24: 4 mg via INTRAVENOUS

## 2012-09-24 MED ORDER — FENTANYL CITRATE 0.05 MG/ML IJ SOLN
25.0000 ug | Freq: Once | INTRAMUSCULAR | Status: AC
  Administered 2012-09-24: 50 ug via INTRAVENOUS

## 2012-09-24 MED ORDER — STERILE WATER FOR IRRIGATION IR SOLN
Status: DC | PRN
  Administered 2012-09-24: 08:00:00

## 2012-09-24 MED ORDER — ONDANSETRON HCL 4 MG/2ML IJ SOLN: 4.0000 mg | Freq: Once | INTRAMUSCULAR | Status: DC | PRN

## 2012-09-24 MED ORDER — FENTANYL CITRATE 0.05 MG/ML IJ SOLN
INTRAMUSCULAR | Status: AC
  Filled 2012-09-24: qty 2

## 2012-09-24 MED ORDER — FENTANYL CITRATE 0.05 MG/ML IJ SOLN: 25.0000 ug | INTRAMUSCULAR | Status: DC | PRN

## 2012-09-24 MED ORDER — FENTANYL CITRATE 0.05 MG/ML IJ SOLN
INTRAMUSCULAR | Status: DC | PRN
  Administered 2012-09-24 (×2): 25 ug via INTRAVENOUS

## 2012-09-24 MED ORDER — ONDANSETRON HCL 4 MG/2ML IJ SOLN
INTRAMUSCULAR | Status: AC
  Filled 2012-09-24: qty 2

## 2012-09-24 MED ORDER — DEXAMETHASONE SODIUM PHOSPHATE 4 MG/ML IJ SOLN
INTRAMUSCULAR | Status: AC
  Filled 2012-09-24: qty 1

## 2012-09-24 MED ORDER — PROPOFOL INFUSION 10 MG/ML OPTIME
INTRAVENOUS | Status: DC | PRN
  Administered 2012-09-24: 90 ug/kg/min via INTRAVENOUS
  Administered 2012-09-24: 50 ug/kg/min via INTRAVENOUS

## 2012-09-24 MED ORDER — LACTATED RINGERS IV SOLN
INTRAVENOUS | Status: DC
  Administered 2012-09-24: 1000 mL via INTRAVENOUS

## 2012-09-24 MED ORDER — SUCRALFATE 1 G PO TABS: 1.0000 g | ORAL_TABLET | Freq: Four times a day (QID) | ORAL | Status: DC

## 2012-09-24 MED ORDER — SODIUM CHLORIDE 0.9 % IV SOLN
INTRAVENOUS | Status: DC | PRN
  Administered 2012-09-24: 08:00:00 via INTRAVENOUS

## 2012-09-24 MED ORDER — MIDAZOLAM HCL 5 MG/5ML IJ SOLN
INTRAMUSCULAR | Status: DC | PRN
  Administered 2012-09-24: 2 mg via INTRAVENOUS

## 2012-09-24 MED ORDER — PROPOFOL 10 MG/ML IV EMUL
INTRAVENOUS | Status: AC
  Filled 2012-09-24: qty 20

## 2012-09-24 SURGICAL SUPPLY — 21 items
BAND LIGATOR SUPER 7 2.8 (MISCELLANEOUS) ×1 IMPLANT
BLOCK BITE 60FR ADLT L/F BLUE (MISCELLANEOUS) ×2 IMPLANT
ELECT REM PT RETURN 9FT ADLT (ELECTROSURGICAL)
ELECTRODE REM PT RTRN 9FT ADLT (ELECTROSURGICAL) IMPLANT
FLOOR PAD 36X40 (MISCELLANEOUS)
FORCEP COLD BIOPSY (CUTTING FORCEPS) IMPLANT
FORCEP RJ3 GP 1.8X160 W-NEEDLE (CUTTING FORCEPS) IMPLANT
FORCEPS BIOP RAD 4 LRG CAP 4 (CUTTING FORCEPS) IMPLANT
MANIFOLD NEPTUNE II (INSTRUMENTS) ×2 IMPLANT
MANIFOLD NEPTUNE WASTE (CANNULA) ×1 IMPLANT
NDL SCLEROTHERAPY 25GX240 (NEEDLE) IMPLANT
NEEDLE SCLEROTHERAPY 25GX240 (NEEDLE) IMPLANT
PAD FLOOR 36X40 (MISCELLANEOUS) IMPLANT
PROBE APC STR FIRE (PROBE) IMPLANT
PROBE INJECTION GOLD (MISCELLANEOUS)
PROBE INJECTION GOLD 7FR (MISCELLANEOUS) IMPLANT
SNARE ROTATE MED OVAL 20MM (MISCELLANEOUS) IMPLANT
SYR 50ML LL SCALE MARK (SYRINGE) ×1 IMPLANT
TUBING ENDO SMARTCAP PENTAX (MISCELLANEOUS) IMPLANT
TUBING IRRIGATION ENDOGATOR (MISCELLANEOUS) ×2 IMPLANT
WATER STERILE IRR 1000ML POUR (IV SOLUTION) ×1 IMPLANT

## 2012-09-24 NOTE — Transfer of Care (Signed)
Immediate Anesthesia Transfer of Care Note  Patient: Maria [name]  Procedure(s) Performed: Procedure(s) (LRB) with comments: ESOPHAGOGASTRODUODENOSCOPY (EGD) WITH PROPOFOL (N/A) - GE junction at 36 ESOPHAGEAL BANDING (N/A) - Banding x 3  Patient Location: PACU  Anesthesia Type:MAC  Level of Consciousness: awake and patient cooperative  Airway & Oxygen Therapy: Patient Spontanous Breathing  Post-op Assessment: Report given to PACU RN, Post -op Vital signs reviewed and stable and Patient moving all extremities  Post vital signs: Reviewed and stable  Complications: No apparent anesthesia complications

## 2012-09-24 NOTE — Anesthesia Preprocedure Evaluation (Signed)
Anesthesia Evaluation  Patient identified by MRN, date of birth, ID band Patient awake    Reviewed: Allergy & Precautions, H&P , NPO status , Patient's Chart, lab work & pertinent test results  History of Anesthesia Complications (+) PONV  Airway Mallampati: I TM Distance: >3 FB     Dental  (+) Teeth Intact   Pulmonary  breath sounds clear to auscultation        Cardiovascular hypertension, Pt. on medications Rhythm:Regular Rate:Normal     Neuro/Psych Anxiety    GI/Hepatic GERD-  Medicated and Controlled,(+) Cirrhosis -  Esophageal Varices     ,   Endo/Other  diabetes, Well Controlled, Type 2Hypothyroidism   Renal/GU      Musculoskeletal  (+) Fibromyalgia -  Abdominal   Peds  Hematology   Anesthesia Other Findings   Reproductive/Obstetrics                           Anesthesia Physical Anesthesia Plan  ASA: III  Anesthesia Plan: MAC   Post-op Pain Management:    Induction: Intravenous  Airway Management Planned: Simple Face Mask  Additional Equipment:   Intra-op Plan:   Post-operative Plan:   Informed Consent: I have reviewed the patients History and Physical, chart, labs and discussed the procedure including the risks, benefits and alternatives for the proposed anesthesia with the patient or authorized representative who has indicated his/her understanding and acceptance.     Plan Discussed with:   Anesthesia Plan Comments:         Anesthesia Quick Evaluation

## 2012-09-24 NOTE — Brief Op Note (Signed)
09/24/2012  8:35 AM  PATIENT:  Maria [name]  57 y.o. female  PRE-OPERATIVE DIAGNOSIS:  esophageal varices, cirrhosis  POST-OPERATIVE DIAGNOSIS:  esophageal varices, cirrhosis, GAVE, portal hypertrophy  PROCEDURE:  Procedure(s) (LRB) with comments: ESOPHAGOGASTRODUODENOSCOPY (EGD) WITH PROPOFOL (N/A) - GE junction at 36 ESOPHAGEAL BANDING (N/A) - Banding x 3  SURGEON:  Surgeon(s) and Role:    * [name], MD - Primary  Findings; 3 short columns of esophageal varices banded. Portal gastropathy. Gastric antral vascular ectasia.

## 2012-09-24 NOTE — Progress Notes (Signed)
Gave 41mg (Iml) to BPatrici Ranksto use in Operating Room, if needed. (Not wasted in pre-operative area)

## 2012-09-24 NOTE — Anesthesia Postprocedure Evaluation (Signed)
  Anesthesia Post-op Note  Patient: Maria [name]  Procedure(s) Performed: Procedure(s) (LRB) with comments: ESOPHAGOGASTRODUODENOSCOPY (EGD) WITH PROPOFOL (N/A) - GE junction at 36 ESOPHAGEAL BANDING (N/A) - Banding x 3  Patient Location: PACU  Anesthesia Type:MAC  Level of Consciousness: awake, alert , oriented and patient cooperative  Airway and Oxygen Therapy: Patient Spontanous Breathing  Post-op Pain: none  Post-op Assessment: Post-op Vital signs reviewed, Patient's Cardiovascular Status Stable, Respiratory Function Stable, RESPIRATORY FUNCTION UNSTABLE, No signs of Nausea or vomiting and Pain level controlled  Post-op Vital Signs: Reviewed and stable  Complications: No apparent anesthesia complications

## 2012-09-24 NOTE — H&P (Signed)
Maria [name] is an 57 y.o. female.   Chief Complaint: Patient is here for EGD and possible esophageal variceal banding. 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 First episode occurred in November 2011 and the second episode occurred in November 2013. She was treated at this facility in for the second episode she was treated at [facility] in [place]. She denies heartburn dysphagia abdominal pain melena or rectal bleeding. Her hemoglobin few days ago is 12.4 g. She has low platelet count but they're always formed and difficult to get accurate estimation.  Past Medical History  Diagnosis Date  . Cirrhosis   . NAFLD (nonalcoholic fatty liver disease)   . Esophageal bleed, non-variceal   . Hypertension   . Hypothyroidism   . Fibromyalgia   . Osteoarthrosis   . Diabetes mellitus   . UTI (urinary tract infection) 11/12 end of the month    Patient feels that she passed a kidney stone at that time  . PONV (postoperative nausea and vomiting)     Past Surgical History  Procedure Date  . Esophagogastroduodenoscopy w/ banding 08/2010  . Appendectomy 1980  . Tonsillectomy   . Bilateral salpingoophorectomy   . Vaginal hysterectomy   . Colonoscopy 03/15/2011  . Upper gastrointestinal endoscopy 03/15/2011    EGD ED BANDING/TCS  . Upper gastrointestinal endoscopy 09/05/2010  . Upper gastrointestinal endoscopy 08/11/2010  . Esophagogastroduodenoscopy 04/24/2012    Procedure: ESOPHAGOGASTRODUODENOSCOPY (EGD);  Surgeon: [name], MD;  Location: [facility];  Service: Endoscopy;  Laterality: N/A;  300  . Esophageal banding 04/24/2012    Procedure: ESOPHAGEAL BANDING;  Surgeon: [name], MD;  Location: [facility];  Service: Endoscopy;  Laterality: N/A;  . Esophageal banding 08/03/2012    [facility] in [place] , [place]     Family History  Problem Relation Age of Onset  . Lung cancer Mother   . Diabetes Father   . Diabetes Sister   . Hypertension Sister   . Hypothyroidism Sister   . Colon cancer Brother   . Diabetes Sister   . Hypothyroidism Brother   . Healthy Daughter   . Obesity Daughter   . Hypertension Daughter    Social History:  reports that she has never smoked. She has never used smokeless tobacco. She reports that she does not drink alcohol or use illicit drugs.  Allergies: No Known Allergies  Medications Prior to Admission  Medication Sig Dispense Refill  . Cholecalciferol (VITAMIN D) 400 UNITS capsule Take 400 Units by mouth daily.      . furosemide (LASIX) 20 MG tablet TAKE 1 TABLET EVERY DAY AS NEEDED FOR SEVERE LEG SWELLING  302 tablet  2  . insulin lispro (HUMALOG) 100 UNIT/ML injection Inject into the skin 3 (three) times daily before meals. Rate of 5 units/hour except smaller dose at night per patient. Patient may bolus as needed.      Marland Kitchen levothyroxine (SYNTHROID, LEVOTHROID) 25 MCG tablet Take 25 mcg by mouth daily.        . metFORMIN (GLUCOPHAGE) 500 MG tablet Take 500 mg by mouth 2 (two) times daily.      Marland Kitchen MILK THISTLE PO Take by mouth 2 (two) times daily.        . pantoprazole (PROTONIX) 40 MG tablet Take 40 mg by mouth 2 (two) times daily.      Marland Kitchen PARoxetine (PAXIL) 20  MG tablet Take 2 tablets (40 mg total) by mouth daily.  60 tablet  5  . potassium chloride SA (K-DUR,KLOR-CON) 20 MEQ tablet Take 20 mEq by mouth daily as needed. Takes when she takes lasix      . propranolol (INDERAL) 20 MG tablet Take 1 tablet (20 mg total) by mouth 2 (two) times daily.  60 tablet  5  . vitamin E 400 UNIT capsule Take 400 Units by mouth daily.        Results for orders placed during the hospital encounter of 09/24/12 (from the past 48 hour(s))  GLUCOSE, CAPILLARY     Status: Abnormal   Collection Time   09/24/12  6:50 AM      Component Value Range Comment   Glucose-Capillary 172 (*) 70 -  99 mg/dL    No results found.  ROS  Blood pressure 149/58, pulse 61, temperature 98.4 F (36.9 C), temperature source Oral, resp. rate 12, SpO2 96.00%. Physical Exam  Constitutional: She appears well-developed and well-nourished.  HENT:  Mouth/Throat: Oropharynx is clear and moist.  Eyes: Conjunctivae normal are normal. No scleral icterus.  Neck: No thyromegaly present.  Cardiovascular: Normal rate, regular rhythm and normal heart sounds.   No murmur heard. Respiratory: Effort normal and breath sounds normal.  GI: Soft. She exhibits no distension and no mass. There is no tenderness.  Musculoskeletal: She exhibits no edema.  Lymphadenopathy:    She has no cervical adenopathy.  Skin: Skin is warm and dry.     Assessment/Plan Cirrhosis secondary to NAFLD.Marland Kitchen History of esophageal variceal bleed. EGD with esophageal variceal banding.  REHMAN,NAJEEB U 09/24/2012, 7:25 AM

## 2012-09-24 NOTE — Op Note (Signed)
[name], Maria [name]             ACCOUNT NO.:  [id number]  MEDICAL RECORD NO.:  [id number]  LOCATION:  DOIB                          FACILITY:  [facility]  PHYSICIAN:  Hildred Laser, M.D.    DATE OF BIRTH:  [birth year]  DATE OF PROCEDURE:  09/24/2012 DATE OF DISCHARGE:                              OPERATIVE REPORT   PROCEDURES:  Esophagogastroduodenoscopy with esophageal variceal banding.  INDICATION:  The patient is a 57 year old Caucasian female with cirrhosis secondary to NAFLD complicated by esophageal variceal bleed. First episode occurred in November 2011, she was seen in this facility. She has had periodic banding sessions since then.  She had another episode in November 2013 when she was treated at [facility] in [place].  She is now undergoing elective procedure with banding.  Her hemoglobin earlier this week was 12.4 g.  Procedure risks were reviewed with the patient.  Informed consent was obtained.  MEDS FOR SEDATION/ANESTHESIA:  Please see anesthesia records for details.  The patient was given propofol.  FINDINGS:  Procedure performed in the OR.  Once the patient was sedated, time-out was carried out.  Pentax videoscope was passed to the oropharynx without any difficulty into esophagus.  Esophagus:  Mucosa of the proximal middle segments normal.  Distally, there were 3 columns of varices.  These were short.  Scarring evident from previous banding sessions.  GE junction was at 36 cm from the incisors.  No luminal narrowing was noted.  Stomach:  It was empty and distended very well with insufflation.  Folds of proximal stomach normal.  Examination of mucosa revealed snake skin in a mosaic pattern, along with patchy erythema gastric body.  Multiple telangiectasia noted at antrum with nodularity to mucosa.  Pyloric channel was patent.  Angularis, fundus, and cardia were examined by retroflexion scope.  No fundal varices were  present.  Duodenum:  Bulbar and postbulbar mucosa was normal.  Endoscope was withdrawn.  Esophageal variceal banding.  Multiple banding device was loaded onto the endoscope.  Endoscope was passed again through oropharynx without any difficulty into esophagus. Three columns were banded starting with one on the left side just above the GE junction.  Second band was applied to the varix on the right side both proximally.  There is some oozing from this varix before it was banded.  Third colon was banded at 12 o'clock.  Endoscope was withdrawn. The patient tolerated the procedure well.  FINAL DIAGNOSES: 1. Recurrent esophageal varices.  Three columns banded. 2. Portal gastropathy. 3. Gastric antral vascular ectasia.  RECOMMENDATIONS: 1. Full liquids for 24 hours. 2. Carafate 1 g before meals and at bedtime for 2 weeks.  She will return for office visit in 12 weeks.          ______________________________ Hildred Laser, M.D.     NR/MEDQ  D:  09/24/2012  T:  09/24/2012  Job:  [id number]  cc:   Jerene Bears, MD Fax: [phone]

## 2012-09-24 NOTE — Addendum Note (Signed)
Addendum  created 09/24/12 1323 by Charmaine Downs, CRNA   Modules edited:Anesthesia Medication Administration, Charges VN

## 2012-09-24 NOTE — Addendum Note (Signed)
Addendum  created 09/24/12 1324 by Charmaine Downs, CRNA   Modules edited:Anesthesia Medication Administration, Charges VN

## 2012-09-26 ENCOUNTER — Encounter (HOSPITAL_COMMUNITY): Payer: Self-pay | Admitting: Internal Medicine

## 2012-09-26 LAB — TYPE AND SCREEN
ABO/RH(D): O NEG
Antibody Screen: POSITIVE
DAT, IgG: NEGATIVE
Unit division: 0
Unit division: 0

## 2012-12-09 ENCOUNTER — Encounter (INDEPENDENT_AMBULATORY_CARE_PROVIDER_SITE_OTHER): Payer: Self-pay | Admitting: Internal Medicine

## 2012-12-09 ENCOUNTER — Ambulatory Visit (INDEPENDENT_AMBULATORY_CARE_PROVIDER_SITE_OTHER): Payer: PRIVATE HEALTH INSURANCE | Admitting: Internal Medicine

## 2012-12-09 VITALS — BP 118/64 | HR 76 | Temp 98.5°F | Ht 64.0 in | Wt 230.7 lb

## 2012-12-09 DIAGNOSIS — R1310 Dysphagia, unspecified: Secondary | ICD-10-CM | POA: Insufficient documentation

## 2012-12-09 DIAGNOSIS — I8500 Esophageal varices without bleeding: Secondary | ICD-10-CM | POA: Insufficient documentation

## 2012-12-09 NOTE — Patient Instructions (Addendum)
Barium pill study. Further recommendations to follow

## 2012-12-09 NOTE — Progress Notes (Signed)
Subjective:     Patient ID: Maria [name], female   DOB: November 30, 1955, 57 y.o.   MRN: [id number]  HPI Presents today with c/o sorethroat. She tells me she can see vessels in her throat.  She is also having trouble bloating. She says she cannot swallow anything hardly solid.  She has a hard time swallowing bread. She has been living off peanut butter, soup, Yogurt, and oatmeal.  She thinks she needs banding.  She has not vomited any blood. Her stools are irregular She recently saw Dr. Lennart Pall in [place] for possible liver transplant. Hx of NAFLD.  09/24/2012: CNO:BSJGG DIAGNOSES:  1. Recurrent esophageal varices. Three columns banded.  2. Portal gastropathy.  3. Gastric antral vascular ectasia. 04/2012: EGD: Hx of esophageal bleed; Impression:  Small esophageal varices not large enough to be banded.  Esophageal scarring from previous banding.  Portal gastropathy.  Gastric antral vascular ectasia.  Nodular antral mucosa unchanged from EGD of one year ago.   EGD/Colonoscopy 03/15/2011 FINAL DIAGNOSES:  1. Recurrent esophageal varices grade III, 3 columns; these were  banded.  2. Mild portal gastropathy.  3. Stable antral mucosal nodularity.  4. Two small polyps ablated via cold biopsy, one from the cecum and  second one from the ascending colon.  5. A 6-7 mm polyp snared from proximal transverse colon.  6. A single arteriovenous malformation at splenic flexure.  7. Two small polyps were coagulated using snare, one at splenic  flexure and one at descending colon.    Review of Systems see hpi Current Outpatient Prescriptions  Medication Sig Dispense Refill  . Cholecalciferol (VITAMIN D) 400 UNITS capsule Take 400 Units by mouth daily.      . furosemide (LASIX) 20 MG tablet TAKE 1 TABLET EVERY DAY AS NEEDED FOR SEVERE LEG SWELLING  302 tablet  2  . insulin lispro (HUMALOG) 100 UNIT/ML injection Inject into the skin 3 (three) times daily before meals. Rate of 5 units/hour except  smaller dose at night per patient. Patient may bolus as needed.      Marland Kitchen levothyroxine (SYNTHROID, LEVOTHROID) 25 MCG tablet Take 25 mcg by mouth daily.        . metFORMIN (GLUCOPHAGE) 500 MG tablet Take 500 mg by mouth 2 (two) times daily.      Marland Kitchen MILK THISTLE PO Take by mouth 2 (two) times daily.        . pantoprazole (PROTONIX) 40 MG tablet Take 40 mg by mouth 2 (two) times daily.      Marland Kitchen PARoxetine (PAXIL) 20 MG tablet Take 2 tablets (40 mg total) by mouth daily.  60 tablet  5  . potassium chloride SA (K-DUR,KLOR-CON) 20 MEQ tablet Take 20 mEq by mouth daily as needed. Takes when she takes lasix      . propranolol (INDERAL) 20 MG tablet Take 1 tablet (20 mg total) by mouth 2 (two) times daily.  60 tablet  5  . vitamin E 400 UNIT capsule Take 400 Units by mouth daily.       No current facility-administered medications for this visit.   Past Medical History  Diagnosis Date  . Cirrhosis   . NAFLD (nonalcoholic fatty liver disease)   . Esophageal bleed, non-variceal   . Hypertension   . Hypothyroidism   . Fibromyalgia   . Osteoarthrosis   . Diabetes mellitus   . UTI (urinary tract infection) 11/12 end of the month    Patient feels that she passed a kidney stone at that time  .  PONV (postoperative nausea and vomiting)    Past Surgical History  Procedure Laterality Date  . Esophagogastroduodenoscopy w/ banding  08/2010  . Appendectomy  1980  . Tonsillectomy    . Bilateral salpingoophorectomy    . Vaginal hysterectomy    . Colonoscopy  03/15/2011  . Upper gastrointestinal endoscopy  03/15/2011    EGD ED BANDING/TCS  . Upper gastrointestinal endoscopy  09/05/2010  . Upper gastrointestinal endoscopy  08/11/2010  . Esophagogastroduodenoscopy  04/24/2012    Procedure: ESOPHAGOGASTRODUODENOSCOPY (EGD);  Surgeon: [name], MD;  [facility];  Service: Endoscopy;  Laterality: N/A;  300  . Esophageal banding  04/24/2012    Procedure: ESOPHAGEAL BANDING;  Surgeon: [name], MD;  Location: [facility];  Service: Endoscopy;  Laterality: N/A;  . Esophageal banding  08/03/2012    [facility] in [place] , [place]  . Esophagogastroduodenoscopy (egd) with propofol  09/24/2012    Procedure: ESOPHAGOGASTRODUODENOSCOPY (EGD) WITH PROPOFOL;  Surgeon: [name], MD;  Location: [facility];  Service: Endoscopy;  Laterality: N/A;  GE junction at 36  . Esophageal banding  09/24/2012    Procedure: ESOPHAGEAL BANDING;  Surgeon: [name], MD;  Location: [facility];  Service: Endoscopy;  Laterality: N/A;  Banding x 3   No Known Allergies      Objective:   Physical Exam  Filed Vitals:   12/09/12 1517  BP: 118/64  Pulse: 76  Temp: 98.5 F (36.9 C)  Height: 5' 4"  (1.626 m)  Weight: 230 lb 11.2 oz (104.645 kg)   Alert and oriented. Skin warm and dry. Oral mucosa is moist. Unable to see oral varices.  . Sclera anicteric, conjunctivae is pink. Thyroid not enlarged. No cervical lymphadenopathy. Lungs clear. Heart regular rate and rhythm.  Abdomen is soft. Bowel sounds are positive. No hepatomegaly. No abdominal masses felt. No tenderness.  No edema to lower extremities.       Assessment:     Dysphagia. Hx of esophageal varices with banding. She feels she needs to be banded again.  I discussed this case with Dr. Laural Golden and Dr. Laural Golden in with patient and discussed options.    Plan:     Barium pill study.  If pill lodges, EGD/ED. If normal Gastric empty study.

## 2012-12-12 ENCOUNTER — Ambulatory Visit (HOSPITAL_COMMUNITY)
Admission: RE | Admit: 2012-12-12 | Discharge: 2012-12-12 | Disposition: A | Discharge status: Home / Self Care | Payer: PRIVATE HEALTH INSURANCE | Source: Ambulatory Visit | Attending: Internal Medicine | Admitting: Internal Medicine

## 2012-12-12 DIAGNOSIS — I8500 Esophageal varices without bleeding: Secondary | ICD-10-CM

## 2012-12-12 DIAGNOSIS — R1310 Dysphagia, unspecified: Secondary | ICD-10-CM | POA: Insufficient documentation

## 2012-12-24 ENCOUNTER — Ambulatory Visit (INDEPENDENT_AMBULATORY_CARE_PROVIDER_SITE_OTHER): Payer: PRIVATE HEALTH INSURANCE | Admitting: Internal Medicine

## 2012-12-31 ENCOUNTER — Encounter (INDEPENDENT_AMBULATORY_CARE_PROVIDER_SITE_OTHER): Payer: Self-pay | Admitting: Internal Medicine

## 2012-12-31 ENCOUNTER — Other Ambulatory Visit (INDEPENDENT_AMBULATORY_CARE_PROVIDER_SITE_OTHER): Payer: Self-pay | Admitting: *Deleted

## 2012-12-31 ENCOUNTER — Ambulatory Visit (INDEPENDENT_AMBULATORY_CARE_PROVIDER_SITE_OTHER): Payer: PRIVATE HEALTH INSURANCE | Admitting: Internal Medicine

## 2012-12-31 ENCOUNTER — Encounter (INDEPENDENT_AMBULATORY_CARE_PROVIDER_SITE_OTHER): Payer: Self-pay | Admitting: *Deleted

## 2012-12-31 VITALS — BP 116/66 | HR 72 | Temp 98.9°F | Resp 18 | Ht 64.0 in | Wt 232.1 lb

## 2012-12-31 DIAGNOSIS — R609 Edema, unspecified: Secondary | ICD-10-CM

## 2012-12-31 DIAGNOSIS — E8779 Other fluid overload: Secondary | ICD-10-CM

## 2012-12-31 DIAGNOSIS — K7460 Unspecified cirrhosis of liver: Secondary | ICD-10-CM

## 2012-12-31 DIAGNOSIS — Z8719 Personal history of other diseases of the digestive system: Secondary | ICD-10-CM

## 2012-12-31 DIAGNOSIS — I8501 Esophageal varices with bleeding: Secondary | ICD-10-CM

## 2012-12-31 NOTE — Progress Notes (Signed)
Presenting complaint;  Followup for cirrhosis History of esophageal variceal bleed.  Subjective:  Patient is 57 year old Caucasian female with history of cirrhosis secondary to nonalcoholic steatohepatitis complicated by esophageal variceal bleed. For screening at episode occurred in November 2011 and second one in November 2013. She had esophageal variceal banding in January 2014. She was seen on 12/09/2012 by Ms. Allen Norris NP and she was complaining of dysphagia. She had barium pill esophagogram which was normal other than perhaps mild esophageal motility disorder. She now states her swallowing is better but she feels food stays in her stomach for long. She has changed eating habits and is on bland diet and this symptom has improved. Is trying to walk some on treadmill. She has lost 5 pounds since her last visit. She is on reduced dose of insulin and metformin was discontinued by her endocrinologist. Her last hemoglobin A1c was 6.6.  Current Medications: Current Outpatient Prescriptions  Medication Sig Dispense Refill  . Cholecalciferol (VITAMIN D) 400 UNITS capsule Take 1,000 Units by mouth daily.       . furosemide (LASIX) 20 MG tablet TAKE 1 TABLET EVERY DAY AS NEEDED FOR SEVERE LEG SWELLING  302 tablet  2  . insulin lispro (HUMALOG) 100 UNIT/ML injection Inject into the skin 3 (three) times daily before meals. Rate of 5 units/hour except smaller dose at night per patient. Patient may bolus as needed.      Marland Kitchen levothyroxine (SYNTHROID, LEVOTHROID) 25 MCG tablet Take 25 mcg by mouth daily.        Marland Kitchen MILK THISTLE PO Take 450 mg by mouth daily.       . pantoprazole (PROTONIX) 40 MG tablet Take 40 mg by mouth 2 (two) times daily.      Marland Kitchen PARoxetine (PAXIL) 20 MG tablet Take 2 tablets (40 mg total) by mouth daily.  60 tablet  5  . potassium chloride SA (K-DUR,KLOR-CON) 20 MEQ tablet Take 20 mEq by mouth daily as needed. Takes when she takes lasix      . propranolol (INDERAL) 20 MG tablet Take 1  tablet (20 mg total) by mouth 2 (two) times daily.  60 tablet  5  . vitamin E 400 UNIT capsule Take 400 Units by mouth daily.       No current facility-administered medications for this visit.     Objective: Blood pressure 116/66, pulse 72, temperature 98.9 F (37.2 C), temperature source Oral, resp. rate 18, height 5' 4"  (1.626 m), weight 232 lb 1.6 oz (105.28 kg). Patient is alert and in no acute distress. Does not have asterixis. Conjunctiva is pink. Sclera is nonicteric Oropharyngeal mucosa is normal. No neck masses or thyromegaly noted. Cardiac exam with regular rhythm normal S1 and S2. She  has short systolic murmur at LLSB to Lungs are clear to auscultation. Abdomen is full but soft and nontender without organomegaly or masses. No LE edema or clubbing noted.    Assessment:  #1. Cirrhosis secondary to NAFLD complicated by esophageal variceal bleed x2. First episode occurred in November 2011 and the second episode 2 years later. Last banding session was about 3 months ago. She is due for repeat EGD and banding if indicated. #2. Obesity. She has lost 5 pounds since her last visit and she must push herself and continue to lose weight. #3. Possible diabetic gastroparesis. Will continue dietary measures. #4. Dysphagia has improved. Question of mild esophageal dysmotility on recent BPE. #5. Mild fluid overload requiring low salt diet and when necessary use of  furosemide.  Plan:  Esophagogastroduodenoscopy with esophageal variceal banding. Will check AFP at the time of EGD. She will also need ultrasound for [place] screening. Office visit in 4 months.

## 2012-12-31 NOTE — Patient Instructions (Addendum)
EGD and variceal banding to be scheduled. Try to drink 2 cups of coffee daily

## 2013-01-15 ENCOUNTER — Encounter (HOSPITAL_COMMUNITY): Payer: Self-pay | Admitting: Pharmacy Technician

## 2013-01-29 ENCOUNTER — Ambulatory Visit (HOSPITAL_COMMUNITY)
Admission: RE | Admit: 2013-01-29 | Discharge: 2013-01-29 | Disposition: A | Discharge status: Home / Self Care | Payer: Medicare Other | Source: Ambulatory Visit | Attending: Internal Medicine | Admitting: Internal Medicine

## 2013-01-29 ENCOUNTER — Encounter (HOSPITAL_COMMUNITY)
Admission: RE | Disposition: A | Discharge status: Home / Self Care | Payer: Self-pay | Source: Ambulatory Visit | Attending: Internal Medicine

## 2013-01-29 ENCOUNTER — Encounter (HOSPITAL_COMMUNITY): Payer: Self-pay | Admitting: *Deleted

## 2013-01-29 DIAGNOSIS — I8511 Secondary esophageal varices with bleeding: Secondary | ICD-10-CM | POA: Insufficient documentation

## 2013-01-29 DIAGNOSIS — D131 Benign neoplasm of stomach: Secondary | ICD-10-CM

## 2013-01-29 DIAGNOSIS — K7689 Other specified diseases of liver: Secondary | ICD-10-CM | POA: Insufficient documentation

## 2013-01-29 DIAGNOSIS — M1990 Unspecified osteoarthritis, unspecified site: Secondary | ICD-10-CM | POA: Insufficient documentation

## 2013-01-29 DIAGNOSIS — E039 Hypothyroidism, unspecified: Secondary | ICD-10-CM | POA: Insufficient documentation

## 2013-01-29 DIAGNOSIS — I10 Essential (primary) hypertension: Secondary | ICD-10-CM | POA: Insufficient documentation

## 2013-01-29 DIAGNOSIS — I8501 Esophageal varices with bleeding: Secondary | ICD-10-CM

## 2013-01-29 DIAGNOSIS — K319 Disease of stomach and duodenum, unspecified: Secondary | ICD-10-CM | POA: Insufficient documentation

## 2013-01-29 DIAGNOSIS — K7460 Unspecified cirrhosis of liver: Secondary | ICD-10-CM | POA: Insufficient documentation

## 2013-01-29 DIAGNOSIS — Z794 Long term (current) use of insulin: Secondary | ICD-10-CM | POA: Insufficient documentation

## 2013-01-29 DIAGNOSIS — E119 Type 2 diabetes mellitus without complications: Secondary | ICD-10-CM | POA: Insufficient documentation

## 2013-01-29 DIAGNOSIS — K31819 Angiodysplasia of stomach and duodenum without bleeding: Secondary | ICD-10-CM | POA: Insufficient documentation

## 2013-01-29 DIAGNOSIS — Z79899 Other long term (current) drug therapy: Secondary | ICD-10-CM | POA: Insufficient documentation

## 2013-01-29 DIAGNOSIS — Z800 Family history of malignant neoplasm of digestive organs: Secondary | ICD-10-CM | POA: Insufficient documentation

## 2013-01-29 DIAGNOSIS — K766 Portal hypertension: Secondary | ICD-10-CM

## 2013-01-29 DIAGNOSIS — IMO0001 Reserved for inherently not codable concepts without codable children: Secondary | ICD-10-CM | POA: Insufficient documentation

## 2013-01-29 HISTORY — PX: ESOPHAGOGASTRODUODENOSCOPY: SHX5428

## 2013-01-29 HISTORY — PX: ESOPHAGEAL BANDING: SHX5518

## 2013-01-29 LAB — GLUCOSE, CAPILLARY: Glucose-Capillary: 129 mg/dL — ABNORMAL HIGH (ref 70–99)

## 2013-01-29 SURGERY — EGD (ESOPHAGOGASTRODUODENOSCOPY)
Anesthesia: Moderate Sedation

## 2013-01-29 MED ORDER — MIDAZOLAM HCL 5 MG/5ML IJ SOLN
INTRAMUSCULAR | Status: AC
  Filled 2013-01-29: qty 5

## 2013-01-29 MED ORDER — MEPERIDINE HCL 25 MG/ML IJ SOLN
INTRAMUSCULAR | Status: DC | PRN
  Administered 2013-01-29 (×2): 25 mg via INTRAVENOUS

## 2013-01-29 MED ORDER — BUTAMBEN-TETRACAINE-BENZOCAINE 2-2-14 % EX AERO
INHALATION_SPRAY | CUTANEOUS | Status: DC | PRN
  Administered 2013-01-29: 2 via TOPICAL
  Administered 2013-01-29: 1 via TOPICAL

## 2013-01-29 MED ORDER — MIDAZOLAM HCL 5 MG/5ML IJ SOLN
INTRAMUSCULAR | Status: AC
  Filled 2013-01-29: qty 10

## 2013-01-29 MED ORDER — MEPERIDINE HCL 50 MG/ML IJ SOLN
INTRAMUSCULAR | Status: AC
  Filled 2013-01-29: qty 1

## 2013-01-29 MED ORDER — MIDAZOLAM HCL 5 MG/5ML IJ SOLN
INTRAMUSCULAR | Status: DC | PRN
  Administered 2013-01-29: 3 mg via INTRAVENOUS
  Administered 2013-01-29: 1 mg via INTRAVENOUS
  Administered 2013-01-29 (×4): 2 mg via INTRAVENOUS

## 2013-01-29 MED ORDER — SODIUM CHLORIDE 0.9 % IV SOLN
INTRAVENOUS | Status: DC
  Administered 2013-01-29: 11:00:00 via INTRAVENOUS

## 2013-01-29 MED ORDER — SUCRALFATE 1 GM/10ML PO SUSP: 1.0000 g | Freq: Four times a day (QID) | ORAL | Status: DC

## 2013-01-29 NOTE — H&P (Signed)
Maria [name] is an 57 y.o. female.   Chief Complaint: Patient's here for EGD and possible esophageal variceal banding. HPI: Patient is 57 year old Caucasian female with history of cirrhosis secondary to nonalcoholic steatohepatitis complicated by variceal bleed. Her first bleed was in November 2011 and second one in November 2013. Most recently she had pending in January 2014. She is presently doing well. She is undergoing repeat EGD with banding if she has significant varices.  Past Medical History  Diagnosis Date  . Cirrhosis   . NAFLD (nonalcoholic fatty liver disease)   . Esophageal bleed, non-variceal   . Hypertension   . Hypothyroidism   . Fibromyalgia   . Osteoarthrosis   . Diabetes mellitus   . UTI (urinary tract infection) 11/12 end of the month    Patient feels that she passed a kidney stone at that time  . PONV (postoperative nausea and vomiting)     Past Surgical History  Procedure Laterality Date  . Esophagogastroduodenoscopy w/ banding  08/2010  . Appendectomy  1980  . Tonsillectomy    . Bilateral salpingoophorectomy    . Vaginal hysterectomy    . Colonoscopy  03/15/2011  . Upper gastrointestinal endoscopy  03/15/2011    EGD ED BANDING/TCS  . Upper gastrointestinal endoscopy  09/05/2010  . Upper gastrointestinal endoscopy  08/11/2010  . Esophagogastroduodenoscopy  04/24/2012    Procedure: ESOPHAGOGASTRODUODENOSCOPY (EGD);  Surgeon: [name], MD;  [facility];  Service: Endoscopy;  Laterality: N/A;  300  . Esophageal banding  04/24/2012    Procedure: ESOPHAGEAL BANDING;  Surgeon: [name], MD;  [facility];  Service: Endoscopy;  Laterality: N/A;  . Esophageal banding  08/03/2012    [facility] in [place] , [place]  . Esophagogastroduodenoscopy (egd) with propofol  09/24/2012    Procedure: ESOPHAGOGASTRODUODENOSCOPY (EGD) WITH PROPOFOL;  Surgeon: [name], MD;  Location: [facility];  Service: Endoscopy;  Laterality:  N/A;  GE junction at 36  . Esophageal banding  09/24/2012    Procedure: ESOPHAGEAL BANDING;  Surgeon: [name], MD;  Location: [facility];  Service: Endoscopy;  Laterality: N/A;  Banding x 3    Family History  Problem Relation Age of Onset  . Lung cancer Mother   . Diabetes Father   . Diabetes Sister   . Hypertension Sister   . Hypothyroidism Sister   . Colon cancer Brother   . Diabetes Sister   . Hypothyroidism Brother   . Healthy Daughter   . Obesity Daughter   . Hypertension Daughter    Social History:  reports that she has never smoked. She has never used smokeless tobacco. She reports that she does not drink alcohol or use illicit drugs.  Allergies: No Known Allergies  Medications Prior to Admission  Medication Sig Dispense Refill  . Cholecalciferol (VITAMIN D) 400 UNITS capsule Take 1,000 Units by mouth daily.       . furosemide (LASIX) 20 MG tablet TAKE 1 TABLET EVERY DAY AS NEEDED FOR SEVERE LEG SWELLING  302 tablet  2  . insulin lispro (HUMALOG) 100 UNIT/ML injection Inject into the skin 3 (three) times daily before meals. Rate of 5 units/hour except smaller dose at night per patient. Patient may bolus as needed.      Marland Kitchen levothyroxine (SYNTHROID, LEVOTHROID) 25 MCG tablet Take 25 mcg by mouth daily.        Marland Kitchen MILK THISTLE PO Take 450 mg by mouth daily.       Marland Kitchen  pantoprazole (PROTONIX) 40 MG tablet Take 40 mg by mouth daily.       Marland Kitchen PARoxetine (PAXIL) 20 MG tablet Take 2 tablets (40 mg total) by mouth daily.  60 tablet  5  . potassium chloride SA (K-DUR,KLOR-CON) 20 MEQ tablet Take 20 mEq by mouth daily as needed. Takes when she takes lasix      . propranolol (INDERAL) 20 MG tablet Take 1 tablet (20 mg total) by mouth 2 (two) times daily.  60 tablet  5  . vitamin C (ASCORBIC ACID) 500 MG tablet Take 500 mg by mouth daily.      . vitamin E 400 UNIT capsule Take 400 Units by mouth daily.        Results for orders placed during the hospital encounter of 01/29/13 (from the  past 48 hour(s))  GLUCOSE, CAPILLARY     Status: Abnormal   Collection Time    01/29/13 11:09 AM      Result Value Range   Glucose-Capillary 129 (*) 70 - 99 mg/dL   No results found.  ROS  Blood pressure 128/63, pulse 69, temperature 98.9 F (37.2 C), temperature source Oral, resp. rate 19, height 5' 4"  (1.626 m), weight 232 lb (105.235 kg), SpO2 96.00%. Physical Exam  Constitutional: She appears well-developed and well-nourished.  HENT:  Mouth/Throat: Oropharynx is clear and moist.  Eyes: Conjunctivae are normal. No scleral icterus.  Neck: No thyromegaly present.  Cardiovascular: Normal rate, regular rhythm and normal heart sounds.   No murmur heard. Respiratory: Effort normal and breath sounds normal.  GI: Soft. Bowel sounds are normal. She exhibits no distension and no mass. There is no tenderness.  Musculoskeletal: She exhibits no edema.  Lymphadenopathy:    She has no cervical adenopathy.  Neurological: She is alert.  Skin: Skin is warm and dry.     Assessment/Plan History of esophageal variceal bleed. Cirrhosis secondary to nonalcoholic steatohepatitis. EGD and possible esophageal variceal banding.  REHMAN,NAJEEB U 01/29/2013, 11:53 AM

## 2013-01-29 NOTE — Op Note (Signed)
EGD PROCEDURE REPORT  PATIENT:  Maria [name]  MR#:  [id number] Birthdate:  [birth year], 57 y.o., female Endoscopist:  Dr. [name], MD Referred By:  Dr. Glenda Chroman, MD Procedure Date: 01/29/2013  Procedure:   EGD with esophageal variceal banding.  Indications:  Patient is 57 year old Caucasian female with history of esophageal variceal bleed(November 2011 and November 2013) who is here for elective variceal banding. Last EGD with banding was on 09/24/2012.            Informed Consent:  The risks, benefits, alternatives & imponderables which include, but are not limited to, bleeding, infection, perforation, drug reaction and potential missed lesion have been reviewed.  The potential for biopsy, lesion removal, esophageal dilation, etc. have also been discussed.  Questions have been answered.  All parties agreeable.  Please see history & physical in medical record for more information.  Medications:  Demerol 50 mg IV Versed 12 mg IV Cetacaine spray topically for oropharyngeal anesthesia  Description of procedure:  The endoscope was introduced through the mouth and advanced to the second portion of the duodenum without difficulty or limitations. The mucosal surfaces were surveyed very carefully during advancement of the scope and upon withdrawal.  Findings:  Esophagus:  Mucosa of the proximal and the segment was normal. 2 short columns of varices identified proximal to GE junction one on the right was larger and had erosion over it. Proximally there were 2 other small columns. No stricture was identified. GEJ:  37 cm Stomach:  Stomach was empty and distended very well with insufflation. Folds in the proximal stomach are normal. Examination mucosa and body revealed scattered red spots in snake skinning or mosaic pattern. Multiple telangiectasia noted at antrum along with you polyps suspicious for hyperplastic polyps.  pyloric channel was patent.  Fundus and cardia were examined by  retroflexing in the scope and no fundal varices were present. Duodenum:   normal bulbar and post bulbar mucosa.  Therapeutic/Diagnostic Maneuvers Performed:   Endoscope was withdrawn. Multiple banding device was loaded onto the scope which was reintroduced into the esophagus and stomach. Single band was applied to legs proximal to GE junction. Minimal oozing noted during this process. Another column was banded about 4 cm proximal to GE junction.  Complications:   none  Impression: Two columns of esophageal varices were banded. Portal gastropathy. Gastric antral vascular ectasia   few hyperplastic appearing polyps at antrum  Recommendations:  Full liquids for 24 hours. Sucralfate suspension 1 g by mouth a.c. and each bedtime for 2 weeks. Repeat EGD in 12 weeks.  Adrijana Haros U  01/29/2013  12:30 PM  CC: Dr. Glenda Chroman., MD & Dr. Rayne Du ref. provider found

## 2013-01-31 ENCOUNTER — Encounter (HOSPITAL_COMMUNITY): Payer: Self-pay | Admitting: Internal Medicine

## 2013-04-23 ENCOUNTER — Encounter (INDEPENDENT_AMBULATORY_CARE_PROVIDER_SITE_OTHER): Payer: Self-pay | Admitting: *Deleted

## 2013-05-01 ENCOUNTER — Encounter (HOSPITAL_COMMUNITY): Payer: Self-pay | Admitting: Pharmacy Technician

## 2013-05-01 ENCOUNTER — Encounter (INDEPENDENT_AMBULATORY_CARE_PROVIDER_SITE_OTHER): Payer: Self-pay | Admitting: *Deleted

## 2013-05-01 ENCOUNTER — Other Ambulatory Visit (INDEPENDENT_AMBULATORY_CARE_PROVIDER_SITE_OTHER): Payer: Self-pay | Admitting: *Deleted

## 2013-05-01 ENCOUNTER — Telehealth (INDEPENDENT_AMBULATORY_CARE_PROVIDER_SITE_OTHER): Payer: Self-pay | Admitting: *Deleted

## 2013-05-01 DIAGNOSIS — I8500 Esophageal varices without bleeding: Secondary | ICD-10-CM

## 2013-05-01 DIAGNOSIS — K7460 Unspecified cirrhosis of liver: Secondary | ICD-10-CM

## 2013-05-01 NOTE — Telephone Encounter (Signed)
agree

## 2013-05-01 NOTE — Telephone Encounter (Signed)
  Procedure: egd   Reason/Indication:  Cirrhosis, esophageal varices  Has patient had this procedure before?  Yes, 01/2013  If so, when, by whom and where?    Is there a family history of colon cancer?    Who?  What age when diagnosed?    Is patient diabetic?   no      Does patient have prosthetic heart valve?  no  Do you have a pacemaker?  no  Has patient ever had endocarditis? no  Has patient had joint replacement within last 12 months?  no  Is patient on Coumadin, Plavix and/or Aspirin? no  Medications: see EPIC  Allergies: nkda  Medication Adjustment:   Procedure date & time: 05/22/13 at 100

## 2013-05-01 NOTE — Telephone Encounter (Signed)
This encounter was created in error - please disregard.

## 2013-05-05 ENCOUNTER — Ambulatory Visit (INDEPENDENT_AMBULATORY_CARE_PROVIDER_SITE_OTHER): Payer: Medicare Other | Admitting: Internal Medicine

## 2013-05-05 ENCOUNTER — Encounter (INDEPENDENT_AMBULATORY_CARE_PROVIDER_SITE_OTHER): Payer: Self-pay | Admitting: Internal Medicine

## 2013-05-05 VITALS — BP 118/68 | HR 72 | Temp 99.3°F | Resp 18 | Ht 65.0 in | Wt 236.2 lb

## 2013-05-05 DIAGNOSIS — Z8719 Personal history of other diseases of the digestive system: Secondary | ICD-10-CM

## 2013-05-05 DIAGNOSIS — K7460 Unspecified cirrhosis of liver: Secondary | ICD-10-CM

## 2013-05-05 DIAGNOSIS — K219 Gastro-esophageal reflux disease without esophagitis: Secondary | ICD-10-CM

## 2013-05-05 MED ORDER — FUROSEMIDE 20 MG PO TABS: 20.0000 mg | ORAL_TABLET | Freq: Every day | ORAL | Status: DC | PRN

## 2013-05-05 MED ORDER — PROPRANOLOL HCL 20 MG PO TABS: 20.0000 mg | ORAL_TABLET | Freq: Two times a day (BID) | ORAL | Status: DC

## 2013-05-05 MED ORDER — POTASSIUM CHLORIDE CRYS ER 20 MEQ PO TBCR: 20.0000 meq | EXTENDED_RELEASE_TABLET | Freq: Every day | ORAL | Status: DC | PRN

## 2013-05-05 MED ORDER — PANTOPRAZOLE SODIUM 40 MG PO TBEC: 40.0000 mg | DELAYED_RELEASE_TABLET | Freq: Every day | ORAL | Status: DC

## 2013-05-05 NOTE — Patient Instructions (Signed)
Physician will contact you with results of blood work and ultrasound when completed

## 2013-05-05 NOTE — Progress Notes (Signed)
Presenting complaint;  Follow for cirrhosis complicated by esophageal variceal bleed.  Subjective:  Patient is 57 year old Caucasian female who has cirrhosis secondary to NAFLD complicated by 2 episodes of esophageal variceal bleed. First episode occurred in November 2011 the second episode occurred in November 2013. She has been bended periodically. Last banding session was on 01/29/2013. She denies melena or rectal bleeding hematemesis or dysphagia. She feels better. She feels she has more strength and stamina. She tries to walk regularly. This summer she's been having with her family. She has occasional heartburn with certain foods. She has 1-2 bowel movements daily. She takes Lasix no more than a couple of times a month. She is requesting four prescriptions.  Current Medications: Current Outpatient Prescriptions  Medication Sig Dispense Refill  . Cholecalciferol (VITAMIN D) 400 UNITS capsule Take 400 Units by mouth daily.       . furosemide (LASIX) 20 MG tablet TAKE 1 TABLET EVERY DAY AS NEEDED FOR SEVERE LEG SWELLING  302 tablet  2  . insulin lispro (HUMALOG) 100 UNIT/ML injection Inject into the skin 3 (three) times daily before meals. Rate of 5 units/hour except smaller dose at night per patient. Patient may bolus as needed.      Marland Kitchen levothyroxine (SYNTHROID, LEVOTHROID) 25 MCG tablet Take 25 mcg by mouth daily.        Marland Kitchen MILK THISTLE PO Take 450 mg by mouth daily.       . pantoprazole (PROTONIX) 40 MG tablet Take 40 mg by mouth daily.       Marland Kitchen PARoxetine (PAXIL) 40 MG tablet Take 40 mg by mouth every morning.      . potassium chloride SA (K-DUR,KLOR-CON) 20 MEQ tablet Take 20 mEq by mouth daily as needed. Takes when she takes lasix      . Probiotic Product (PROBIOTIC DAILY PO) Take by mouth daily.      . propranolol (INDERAL) 20 MG tablet Take 20 mg by mouth 2 (two) times daily.      . vitamin C (ASCORBIC ACID) 500 MG tablet Take 500 mg by mouth daily.      . vitamin E 400 UNIT capsule  Take 400 Units by mouth daily.       No current facility-administered medications for this visit.     Objective: Blood pressure 118/68, pulse 72, temperature 99.3 F (37.4 C), temperature source Oral, resp. rate 18, height 5' 5"  (1.651 m), weight 236 lb 3.2 oz (107.14 kg). Patient is alert and in no acute distress. Asterixis absent. Conjunctiva is pink. Sclera is nonicteric Oropharyngeal mucosa is normal. No neck masses or thyromegaly noted. Cardiac exam with regular rhythm normal S1 and S2. Faint SEM at LLSB. Lungs are clear to auscultation. Abdomen is full but soft and nontender without hepatosplenomegaly. No LE edema or clubbing noted.   Assessment:  #1. Cirrhosis secondary to NAFLD complicated by esophageal variceal bleeding on 2 different occasions controlled with variceal banding. Her cirrhosis as manifested more with hemodynamic issues arrive at an hepatic dysfunction. She is due for blood work which some reason was not done in February as requested. #2.GERD. Symptoms well-controlled with therapy. #3. Obesity. She needs to continue her affect in order to lose weight.   Plan:  Patient will go to the lab for CBC, comprehensive chemistry panel and AFP. Abdominal ultrasound. EGD with esophageal variceal banding next month as planned. New prescription given for furosemide, pantoprazole, potassium and propranolol. Office visit in December 2014.

## 2013-05-07 LAB — COMPREHENSIVE METABOLIC PANEL
ALT: 20 U/L (ref 0–35)
AST: 26 U/L (ref 0–37)
Albumin: 3.7 g/dL (ref 3.5–5.2)
Alkaline Phosphatase: 84 U/L (ref 39–117)
BUN: 9 mg/dL (ref 6–23)
CO2: 25 mEq/L (ref 19–32)
Calcium: 8.8 mg/dL (ref 8.4–10.5)
Chloride: 109 mEq/L (ref 96–112)
Creat: 0.64 mg/dL (ref 0.50–1.10)
Glucose, Bld: 128 mg/dL — ABNORMAL HIGH (ref 70–99)
Potassium: 3.5 mEq/L (ref 3.5–5.3)
Sodium: 140 mEq/L (ref 135–145)
Total Bilirubin: 1.1 mg/dL (ref 0.3–1.2)
Total Protein: 6.5 g/dL (ref 6.0–8.3)

## 2013-05-07 LAB — CBC
HCT: 34.8 % — ABNORMAL LOW (ref 36.0–46.0)
Hemoglobin: 11.3 g/dL — ABNORMAL LOW (ref 12.0–15.0)
MCH: 24.4 pg — ABNORMAL LOW (ref 26.0–34.0)
MCHC: 32.5 g/dL (ref 30.0–36.0)
MCV: 75 fL — ABNORMAL LOW (ref 78.0–100.0)
Platelets: 14 10*3/uL — CL (ref 150–400)
RBC: 4.64 MIL/uL (ref 3.87–5.11)
RDW: 16.5 % — ABNORMAL HIGH (ref 11.5–15.5)
WBC: 3.7 10*3/uL — ABNORMAL LOW (ref 4.0–10.5)

## 2013-05-08 LAB — AFP TUMOR MARKER: AFP-Tumor Marker: 4.1 ng/mL (ref 0.0–8.0)

## 2013-05-21 ENCOUNTER — Encounter (INDEPENDENT_AMBULATORY_CARE_PROVIDER_SITE_OTHER): Payer: Self-pay

## 2013-05-22 ENCOUNTER — Ambulatory Visit (HOSPITAL_COMMUNITY)
Admission: RE | Admit: 2013-05-22 | Discharge: 2013-05-22 | Disposition: A | Discharge status: Home / Self Care | Payer: Medicare Other | Source: Ambulatory Visit | Attending: Internal Medicine | Admitting: Internal Medicine

## 2013-05-22 ENCOUNTER — Encounter (HOSPITAL_COMMUNITY)
Admission: RE | Disposition: A | Discharge status: Home / Self Care | Payer: Self-pay | Source: Ambulatory Visit | Attending: Internal Medicine

## 2013-05-22 ENCOUNTER — Encounter (HOSPITAL_COMMUNITY): Payer: Self-pay | Admitting: *Deleted

## 2013-05-22 DIAGNOSIS — I8511 Secondary esophageal varices with bleeding: Secondary | ICD-10-CM

## 2013-05-22 DIAGNOSIS — K31811 Angiodysplasia of stomach and duodenum with bleeding: Secondary | ICD-10-CM

## 2013-05-22 DIAGNOSIS — K7460 Unspecified cirrhosis of liver: Secondary | ICD-10-CM | POA: Insufficient documentation

## 2013-05-22 DIAGNOSIS — I10 Essential (primary) hypertension: Secondary | ICD-10-CM | POA: Insufficient documentation

## 2013-05-22 DIAGNOSIS — E119 Type 2 diabetes mellitus without complications: Secondary | ICD-10-CM | POA: Insufficient documentation

## 2013-05-22 DIAGNOSIS — K766 Portal hypertension: Secondary | ICD-10-CM

## 2013-05-22 DIAGNOSIS — K319 Disease of stomach and duodenum, unspecified: Secondary | ICD-10-CM | POA: Insufficient documentation

## 2013-05-22 DIAGNOSIS — Z8719 Personal history of other diseases of the digestive system: Secondary | ICD-10-CM

## 2013-05-22 DIAGNOSIS — I8500 Esophageal varices without bleeding: Secondary | ICD-10-CM

## 2013-05-22 HISTORY — DX: Anemia, unspecified: D64.9

## 2013-05-22 HISTORY — PX: ESOPHAGOGASTRODUODENOSCOPY: SHX5428

## 2013-05-22 SURGERY — EGD (ESOPHAGOGASTRODUODENOSCOPY)
Anesthesia: Moderate Sedation

## 2013-05-22 MED ORDER — STERILE WATER FOR IRRIGATION IR SOLN
Status: DC | PRN
  Administered 2013-05-22: 13:00:00

## 2013-05-22 MED ORDER — MIDAZOLAM HCL 5 MG/5ML IJ SOLN
INTRAMUSCULAR | Status: AC
  Filled 2013-05-22: qty 10

## 2013-05-22 MED ORDER — BUTAMBEN-TETRACAINE-BENZOCAINE 2-2-14 % EX AERO
INHALATION_SPRAY | CUTANEOUS | Status: DC | PRN
  Administered 2013-05-22: 2 via TOPICAL

## 2013-05-22 MED ORDER — MEPERIDINE HCL 50 MG/ML IJ SOLN
INTRAMUSCULAR | Status: DC | PRN
  Administered 2013-05-22 (×2): 25 mg via INTRAVENOUS

## 2013-05-22 MED ORDER — MIDAZOLAM HCL 5 MG/5ML IJ SOLN
INTRAMUSCULAR | Status: AC
  Filled 2013-05-22: qty 5

## 2013-05-22 MED ORDER — MEPERIDINE HCL 50 MG/ML IJ SOLN
INTRAMUSCULAR | Status: AC
  Filled 2013-05-22: qty 1

## 2013-05-22 MED ORDER — MIDAZOLAM HCL 5 MG/5ML IJ SOLN
INTRAMUSCULAR | Status: DC | PRN
  Administered 2013-05-22: 2 mg via INTRAVENOUS
  Administered 2013-05-22 (×2): 3 mg via INTRAVENOUS
  Administered 2013-05-22 (×2): 2 mg via INTRAVENOUS

## 2013-05-22 MED ORDER — SODIUM CHLORIDE 0.9 % IV SOLN
INTRAVENOUS | Status: DC
  Administered 2013-05-22: 13:00:00 via INTRAVENOUS

## 2013-05-22 NOTE — H&P (Signed)
Maria [name] is an 57 y.o. female.   Chief Complaint: Patient is therefore EGD and possible variceal banding. HPI: Patient is 57 year old Caucasian female with cirrhosis secondary to NAFLD complicated by esophageal variceal bleed initially in November 201 and more recently in November, 2013 who is in for EGD and banding for recurrent varices. Her last EGD with banding was 4 months ago.  Past Medical History  Diagnosis Date  . Cirrhosis   . NAFLD (nonalcoholic fatty liver disease)   . Esophageal bleed, non-variceal   . Hypertension   . Hypothyroidism   . Fibromyalgia   . Osteoarthrosis   . Diabetes mellitus   . UTI (urinary tract infection) 11/12 end of the month    Patient feels that she passed a kidney stone at that time  . PONV (postoperative nausea and vomiting)   . Anemia     Past Surgical History  Procedure Laterality Date  . Esophagogastroduodenoscopy w/ banding  08/2010  . Appendectomy  1980  . Tonsillectomy    . Bilateral salpingoophorectomy    . Vaginal hysterectomy    . Colonoscopy  03/15/2011  . Upper gastrointestinal endoscopy  03/15/2011    EGD ED BANDING/TCS  . Upper gastrointestinal endoscopy  09/05/2010  . Upper gastrointestinal endoscopy  08/11/2010  . Esophagogastroduodenoscopy  04/24/2012    Procedure: ESOPHAGOGASTRODUODENOSCOPY (EGD);  Surgeon: [name], MD;  [facility];  Service: Endoscopy;  Laterality: N/A;  300  . Esophageal banding  04/24/2012    Procedure: ESOPHAGEAL BANDING;  Surgeon: [name], MD;  [facility];  Service: Endoscopy;  Laterality: N/A;  . Esophageal banding  08/03/2012    [facility] in [place] , [place]  . Esophagogastroduodenoscopy (egd) with propofol  09/24/2012    Procedure: ESOPHAGOGASTRODUODENOSCOPY (EGD) WITH PROPOFOL;  Surgeon: [name], MD;  Location: [facility];  Service: Endoscopy;  Laterality: N/A;  GE junction at 36  . Esophageal banding  09/24/2012    Procedure: ESOPHAGEAL  BANDING;  Surgeon: [name], MD;  Location: [facility];  Service: Endoscopy;  Laterality: N/A;  Banding x 3  . Esophagogastroduodenoscopy N/A 01/29/2013    Procedure: ESOPHAGOGASTRODUODENOSCOPY (EGD);  Surgeon: [name], MD;  [facility];  Service: Endoscopy;  Laterality: N/A;  1200  . Esophageal banding N/A 01/29/2013    Procedure: ESOPHAGEAL BANDING;  Surgeon: [name], MD;  [facility];  Service: Endoscopy;  Laterality: N/A;  . Cholecystectomy      Family History  Problem Relation Age of Onset  . Lung cancer Mother   . Diabetes Father   . Diabetes Sister   . Hypertension Sister   . Hypothyroidism Sister   . Colon cancer Brother   . Diabetes Sister   . Hypothyroidism Brother   . Healthy Daughter   . Obesity Daughter   . Hypertension Daughter    Social History:  reports that she has never smoked. She has never used smokeless tobacco. She reports that she does not drink alcohol or use illicit drugs.  Allergies: No Known Allergies  Medications Prior to Admission  Medication Sig Dispense Refill  . Cholecalciferol (VITAMIN D) 400 UNITS capsule Take 400 Units by mouth daily.       . ferrous sulfate 325 (65 FE) MG tablet Take 325 mg by mouth daily with breakfast.      . furosemide (LASIX) 20 MG tablet Take 1 tablet (20 mg total) by mouth daily as needed.  90 tablet  3  . insulin lispro (HUMALOG) 100 UNIT/ML injection Inject into the skin 3 (three) times daily before meals. Rate of 5 units/hour except smaller dose at night per patient. Patient may bolus as needed.      Marland Kitchen levothyroxine (SYNTHROID, LEVOTHROID) 25 MCG tablet Take 25 mcg by mouth daily.        Marland Kitchen MILK THISTLE PO Take 450 mg by mouth daily.       . pantoprazole (PROTONIX) 40 MG tablet Take 1 tablet (40 mg total) by mouth daily.  90 tablet  3  . PARoxetine (PAXIL) 40 MG tablet Take 40 mg by mouth every morning.      . potassium chloride SA (K-DUR,KLOR-CON) 20 MEQ tablet Take 1 tablet (20  mEq total) by mouth daily as needed. Takes when she takes lasix  90 tablet  3  . Probiotic Product (PROBIOTIC DAILY PO) Take by mouth daily.      . propranolol (INDERAL) 20 MG tablet Take 1 tablet (20 mg total) by mouth 2 (two) times daily.  180 tablet  3  . vitamin C (ASCORBIC ACID) 500 MG tablet Take 500 mg by mouth daily.      . vitamin E 400 UNIT capsule Take 400 Units by mouth daily.        No results found for this or any previous visit (from the past 48 hour(s)). No results found.  ROS  Blood pressure 147/64, temperature 98.1 F (36.7 C), temperature source Oral, resp. rate 21, height 5' 5"  (1.651 m), weight 237 lb (107.502 kg). Physical Exam  Constitutional: She appears well-developed and well-nourished.  HENT:  Mouth/Throat: Oropharynx is clear and moist.  Eyes: Conjunctivae are normal. No scleral icterus.  Neck: No thyromegaly present.  Cardiovascular: Normal rate and regular rhythm.   Murmur (faint SEM at LLSB) heard. Respiratory: Effort normal and breath sounds normal.  GI: Soft. She exhibits no distension and no mass. There is no tenderness.  Musculoskeletal: She exhibits no edema.  Lymphadenopathy:    She has no cervical adenopathy.  Neurological: She is alert.  Skin: Skin is warm and dry.     Assessment/Plan History of dysphagia variceal bleed. Cirrhosis secondary to NAFLD. EGD and possible esophageal variceal banding.  Betzaira Mentel U 05/22/2013, 1:41 PM

## 2013-05-22 NOTE — Op Note (Signed)
EGD PROCEDURE REPORT  PATIENT:  Maria [name]  MR#:  [id number] Birthdate:  [birth year], 57 y.o., female Endoscopist:  Dr. [name], MD Referred By:  Dr. Glenda Chroman, MD Procedure Date: 05/22/2013  Procedure:   EGD  Indications:  Patient is 57 year old Caucasian female with history of esophageal variceal bleed secondary to cirrhosis who is here for repeat EGD and banding if indicated.            Informed Consent:  The risks, benefits, alternatives & imponderables which include, but are not limited to, bleeding, infection, perforation, drug reaction and potential missed lesion have been reviewed.  The potential for biopsy, lesion removal, esophageal dilation, etc. have also been discussed.  Questions have been answered.  All parties agreeable.  Please see history & physical in medical record for more information.  Medications:  Demerol 50 mg IV Versed 12 mg IV Cetacaine spray topically for oropharyngeal anesthesia  Description of procedure:  The endoscope was introduced through the mouth and advanced to the second portion of the duodenum without difficulty or limitations. The mucosal surfaces were surveyed very carefully during advancement of the scope and upon withdrawal.  Findings:  Esophagus:  Mucosa of the proximal and middle segment was normal. Scarring noted at previous banding sites without stricture. Two small short columns of varices noted but not large enough to be banded. GEJ:  36 cm Stomach:  Stomach was empty and distended very well with insufflation. Folds in the proximal stomach are normal. Examination mucosa and body revealed mosaic pattern. Multiple telangiectasias he had an order mucosa was noted at antrum. Pyloric child was taken. Fundus and cardia were examined by retroflex in the scope and no bases are identified. Angularis was unremarkable. Duodenum:  Normal bulbar and post bulbar mucosa.  Therapeutic/Diagnostic Maneuvers Performed:  None  Complications:   None  Impression: 2 short columns of small esophageal varices noted the distal esophagus without need for banding. Esophageal scarring from previous banding. Portal gastropathy. Past rate antral vascular ectasia.  Recommendations:  Standard instructions given. Repeat EGD in 6 months.  Corinne Goucher U  05/22/2013  2:07 PM  CC: Dr. Glenda Chroman., MD & Dr. Rayne Du ref. provider found

## 2013-05-23 ENCOUNTER — Encounter (HOSPITAL_COMMUNITY): Payer: Self-pay | Admitting: Internal Medicine

## 2013-09-04 ENCOUNTER — Encounter (INDEPENDENT_AMBULATORY_CARE_PROVIDER_SITE_OTHER): Payer: Medicare Other | Admitting: Ophthalmology

## 2013-09-04 DIAGNOSIS — H3530 Unspecified macular degeneration: Secondary | ICD-10-CM

## 2013-09-04 DIAGNOSIS — H3560 Retinal hemorrhage, unspecified eye: Secondary | ICD-10-CM

## 2013-09-04 DIAGNOSIS — H35039 Hypertensive retinopathy, unspecified eye: Secondary | ICD-10-CM

## 2013-09-04 DIAGNOSIS — I10 Essential (primary) hypertension: Secondary | ICD-10-CM

## 2013-09-04 DIAGNOSIS — E1139 Type 2 diabetes mellitus with other diabetic ophthalmic complication: Secondary | ICD-10-CM

## 2013-09-04 DIAGNOSIS — E11319 Type 2 diabetes mellitus with unspecified diabetic retinopathy without macular edema: Secondary | ICD-10-CM

## 2013-09-08 ENCOUNTER — Ambulatory Visit (INDEPENDENT_AMBULATORY_CARE_PROVIDER_SITE_OTHER): Payer: Medicare Other | Admitting: Internal Medicine

## 2013-09-08 ENCOUNTER — Encounter (INDEPENDENT_AMBULATORY_CARE_PROVIDER_SITE_OTHER): Payer: Self-pay | Admitting: Internal Medicine

## 2013-09-08 VITALS — BP 116/70 | HR 74 | Temp 98.9°F | Resp 18 | Ht 65.0 in | Wt 240.2 lb

## 2013-09-08 DIAGNOSIS — Z8719 Personal history of other diseases of the digestive system: Secondary | ICD-10-CM

## 2013-09-08 DIAGNOSIS — K7460 Unspecified cirrhosis of liver: Secondary | ICD-10-CM

## 2013-09-08 NOTE — Patient Instructions (Signed)
Next blood work will be in February 2015. EGD with variceal banding in February 2015

## 2013-09-08 NOTE — Progress Notes (Signed)
Presenting complaint;  Follow up for cirrhosis.  Subjective:  Patient is a 57 year old Caucasian female with cirrhosis secondary to NAFLD complicated by 2 episodes of esophageal variceal bleed. She has undergone multiple banding sessions most recently on 05/22/2013. She has no complaints. She tries to walk as much as she can. She has not lost any weight. She is using Lasix no more than 2-3 times in a month's period her bowels move regularly but she has noted change in color and consistency. She denies abdominal pain. She has noted a small amount of blood on tissue with bowel movements a few times. She denies frank rectal bleeding or melena. She does drink 2 cups of coffee a day as recommended previously. She states she was found to have evidence of bleeding into her left optic nerve on routine testing. She was subsequently evaluated by Dr. Zigmund Daniel on 09/04/2013 and is being monitored. She was also found to have very mild right macular degeneration.  Current Medications: Current Outpatient Prescriptions  Medication Sig Dispense Refill  . Cholecalciferol (VITAMIN D) 400 UNITS capsule Take 400 Units by mouth daily.       . furosemide (LASIX) 20 MG tablet Take 1 tablet (20 mg total) by mouth daily as needed.  90 tablet  3  . insulin lispro (HUMALOG) 100 UNIT/ML injection Inject into the skin 3 (three) times daily before meals. Rate of 5 units/hour except smaller dose at night per patient. Patient may bolus as needed.      Marland Kitchen levothyroxine (SYNTHROID, LEVOTHROID) 25 MCG tablet Take 25 mcg by mouth daily.        Marland Kitchen MILK THISTLE PO Take 450 mg by mouth daily.       . pantoprazole (PROTONIX) 40 MG tablet Take 1 tablet (40 mg total) by mouth daily.  90 tablet  3  . PARoxetine (PAXIL) 40 MG tablet Take 40 mg by mouth every morning.      . potassium chloride SA (K-DUR,KLOR-CON) 20 MEQ tablet Take 1 tablet (20 mEq total) by mouth daily as needed. Takes when she takes lasix  90 tablet  3  . propranolol  (INDERAL) 20 MG tablet Take 1 tablet (20 mg total) by mouth 2 (two) times daily.  180 tablet  3  . vitamin C (ASCORBIC ACID) 500 MG tablet Take 500 mg by mouth daily.      . vitamin E 400 UNIT capsule Take 400 Units by mouth daily.      . ferrous sulfate 325 (65 FE) MG tablet Take 325 mg by mouth daily with breakfast.      . Probiotic Product (PROBIOTIC DAILY PO) Take by mouth daily.       No current facility-administered medications for this visit.     Objective: Blood pressure 116/70, pulse 74, temperature 98.9 F (37.2 C), temperature source Oral, resp. rate 18, height 5' 5"  (1.651 m), weight 240 lb 3.2 oz (108.954 kg). Patient is alert and in no acute distress. Conjunctiva is pink. Sclera is nonicteric Oropharyngeal mucosa is normal. No neck masses or thyromegaly noted. Cardiac exam with regular rhythm normal S1 and S2. Grade 2/6 systolic ejection murmur noted at LLSB. Lungs are clear to auscultation. Abdomen is full but soft and nontender without organomegaly or masses. She has butterfly needle in [facility] at [facility] for insulin pump.  No LE edema or clubbing noted.  Labs/studies Results: Last ultrasound was in 02/09/2013 revealing splenomegaly and no other abnormalities. AFP 4.1 on 05/07/2013.   Assessment:  #1. Cirrhosis secondary  to NAFLD complicated by esophageal variceal bleed in November 2011 and November 2013. Last banding was over 3 months ago. She hasn't bled in 13 months. She also has thrombocytopenia. She is up-to-date on screening for Leonard. #2. Obesity. She has not been able to lose any weight but I am glad she is more active than she has been in the past.    Plan:  Will schedule patient for EGD with variceal banding in February 2015. She will have CBC, comprehensive chemistry panel and AFP also in February 2015. Hepatic ultrasound and February 2015. Office visit in 6 months.

## 2013-10-03 ENCOUNTER — Encounter (INDEPENDENT_AMBULATORY_CARE_PROVIDER_SITE_OTHER): Payer: Medicare Other | Admitting: Ophthalmology

## 2013-10-03 DIAGNOSIS — E11319 Type 2 diabetes mellitus with unspecified diabetic retinopathy without macular edema: Secondary | ICD-10-CM

## 2013-10-03 DIAGNOSIS — I10 Essential (primary) hypertension: Secondary | ICD-10-CM

## 2013-10-03 DIAGNOSIS — E1165 Type 2 diabetes mellitus with hyperglycemia: Secondary | ICD-10-CM

## 2013-10-03 DIAGNOSIS — H3530 Unspecified macular degeneration: Secondary | ICD-10-CM

## 2013-10-03 DIAGNOSIS — H43819 Vitreous degeneration, unspecified eye: Secondary | ICD-10-CM

## 2013-10-03 DIAGNOSIS — H2510 Age-related nuclear cataract, unspecified eye: Secondary | ICD-10-CM

## 2013-10-03 DIAGNOSIS — H3560 Retinal hemorrhage, unspecified eye: Secondary | ICD-10-CM

## 2013-10-03 DIAGNOSIS — E1139 Type 2 diabetes mellitus with other diabetic ophthalmic complication: Secondary | ICD-10-CM

## 2013-10-03 DIAGNOSIS — H35039 Hypertensive retinopathy, unspecified eye: Secondary | ICD-10-CM

## 2013-10-09 ENCOUNTER — Encounter (INDEPENDENT_AMBULATORY_CARE_PROVIDER_SITE_OTHER): Payer: Self-pay | Admitting: *Deleted

## 2013-10-09 ENCOUNTER — Other Ambulatory Visit (INDEPENDENT_AMBULATORY_CARE_PROVIDER_SITE_OTHER): Payer: Self-pay | Admitting: *Deleted

## 2013-10-09 DIAGNOSIS — K7460 Unspecified cirrhosis of liver: Secondary | ICD-10-CM

## 2013-10-09 DIAGNOSIS — Z8719 Personal history of other diseases of the digestive system: Secondary | ICD-10-CM

## 2013-11-07 LAB — COMPREHENSIVE METABOLIC PANEL
ALT: 20 U/L (ref 0–35)
AST: 27 U/L (ref 0–37)
Albumin: 3.7 g/dL (ref 3.5–5.2)
Alkaline Phosphatase: 83 U/L (ref 39–117)
BUN: 10 mg/dL (ref 6–23)
CO2: 27 mEq/L (ref 19–32)
Calcium: 8.7 mg/dL (ref 8.4–10.5)
Chloride: 110 mEq/L (ref 96–112)
Creat: 0.57 mg/dL (ref 0.50–1.10)
Glucose, Bld: 110 mg/dL — ABNORMAL HIGH (ref 70–99)
Potassium: 3.7 mEq/L (ref 3.5–5.3)
Sodium: 143 mEq/L (ref 135–145)
Total Bilirubin: 1.1 mg/dL (ref 0.3–1.2)
Total Protein: 6.2 g/dL (ref 6.0–8.3)

## 2013-11-07 LAB — CBC
HCT: 34.7 % — ABNORMAL LOW (ref 36.0–46.0)
Hemoglobin: 11.1 g/dL — ABNORMAL LOW (ref 12.0–15.0)
MCH: 24.3 pg — ABNORMAL LOW (ref 26.0–34.0)
MCHC: 32 g/dL (ref 30.0–36.0)
MCV: 76.1 fL — ABNORMAL LOW (ref 78.0–100.0)
RBC: 4.56 MIL/uL (ref 3.87–5.11)
RDW: 16.7 % — ABNORMAL HIGH (ref 11.5–15.5)
WBC: 3.3 10*3/uL — ABNORMAL LOW (ref 4.0–10.5)

## 2013-11-08 LAB — AFP TUMOR MARKER: AFP-Tumor Marker: 3.1 ng/mL (ref 0.0–8.0)

## 2013-11-12 NOTE — Progress Notes (Signed)
Apt has been scheduled for 03/23/14 with Dr. Laural Golden.

## 2013-11-17 ENCOUNTER — Encounter (INDEPENDENT_AMBULATORY_CARE_PROVIDER_SITE_OTHER): Payer: Self-pay | Admitting: *Deleted

## 2013-11-27 ENCOUNTER — Other Ambulatory Visit (INDEPENDENT_AMBULATORY_CARE_PROVIDER_SITE_OTHER): Payer: Self-pay | Admitting: *Deleted

## 2013-11-27 DIAGNOSIS — I8500 Esophageal varices without bleeding: Secondary | ICD-10-CM

## 2013-11-27 DIAGNOSIS — K7460 Unspecified cirrhosis of liver: Secondary | ICD-10-CM

## 2013-11-28 ENCOUNTER — Encounter (INDEPENDENT_AMBULATORY_CARE_PROVIDER_SITE_OTHER): Payer: Self-pay | Admitting: *Deleted

## 2013-12-01 ENCOUNTER — Encounter (INDEPENDENT_AMBULATORY_CARE_PROVIDER_SITE_OTHER): Payer: Medicare Other | Admitting: Ophthalmology

## 2013-12-01 DIAGNOSIS — H3560 Retinal hemorrhage, unspecified eye: Secondary | ICD-10-CM

## 2013-12-01 DIAGNOSIS — E1165 Type 2 diabetes mellitus with hyperglycemia: Secondary | ICD-10-CM

## 2013-12-01 DIAGNOSIS — E11319 Type 2 diabetes mellitus with unspecified diabetic retinopathy without macular edema: Secondary | ICD-10-CM

## 2013-12-01 DIAGNOSIS — E1139 Type 2 diabetes mellitus with other diabetic ophthalmic complication: Secondary | ICD-10-CM

## 2013-12-01 DIAGNOSIS — H43819 Vitreous degeneration, unspecified eye: Secondary | ICD-10-CM

## 2013-12-01 DIAGNOSIS — H35039 Hypertensive retinopathy, unspecified eye: Secondary | ICD-10-CM

## 2013-12-01 DIAGNOSIS — H2510 Age-related nuclear cataract, unspecified eye: Secondary | ICD-10-CM

## 2013-12-01 DIAGNOSIS — I10 Essential (primary) hypertension: Secondary | ICD-10-CM

## 2013-12-05 ENCOUNTER — Telehealth (INDEPENDENT_AMBULATORY_CARE_PROVIDER_SITE_OTHER): Payer: Self-pay | Admitting: *Deleted

## 2013-12-05 NOTE — Telephone Encounter (Signed)
  Procedure: egd  Reason/Indication:  Cirrhosis, esophageal varices  Has patient had this procedure before?  Yes, 2014 -- epic  If so, when, by whom and where?    Is there a family history of colon cancer?    Who?  What age when diagnosed?    Is patient diabetic?   no      Does patient have prosthetic heart valve?  no  Do you have a pacemaker?  no  Has patient ever had endocarditis? no  Has patient had joint replacement within last 12 months?  no  Does patient tend to be constipated or take laxatives?   Is patient on Coumadin, Plavix and/or Aspirin? no  Medications: see epic  Allergies: nkda  Medication Adjustment:   Procedure date & time: 12/31/13

## 2013-12-08 NOTE — Telephone Encounter (Signed)
agree

## 2013-12-15 ENCOUNTER — Encounter (HOSPITAL_COMMUNITY): Payer: Self-pay | Admitting: Pharmacy Technician

## 2013-12-31 ENCOUNTER — Encounter (HOSPITAL_COMMUNITY): Payer: Self-pay | Admitting: *Deleted

## 2013-12-31 ENCOUNTER — Ambulatory Visit (HOSPITAL_COMMUNITY)
Admission: RE | Admit: 2013-12-31 | Discharge: 2013-12-31 | Disposition: A | Discharge status: Home / Self Care | Payer: Medicare Other | Source: Ambulatory Visit | Attending: Internal Medicine | Admitting: Internal Medicine

## 2013-12-31 ENCOUNTER — Encounter (HOSPITAL_COMMUNITY)
Admission: RE | Disposition: A | Discharge status: Home / Self Care | Payer: Self-pay | Source: Ambulatory Visit | Attending: Internal Medicine

## 2013-12-31 DIAGNOSIS — K7460 Unspecified cirrhosis of liver: Secondary | ICD-10-CM | POA: Insufficient documentation

## 2013-12-31 DIAGNOSIS — E669 Obesity, unspecified: Secondary | ICD-10-CM | POA: Insufficient documentation

## 2013-12-31 DIAGNOSIS — K766 Portal hypertension: Secondary | ICD-10-CM

## 2013-12-31 DIAGNOSIS — Z7982 Long term (current) use of aspirin: Secondary | ICD-10-CM | POA: Insufficient documentation

## 2013-12-31 DIAGNOSIS — I10 Essential (primary) hypertension: Secondary | ICD-10-CM | POA: Insufficient documentation

## 2013-12-31 DIAGNOSIS — I8510 Secondary esophageal varices without bleeding: Secondary | ICD-10-CM | POA: Insufficient documentation

## 2013-12-31 DIAGNOSIS — K319 Disease of stomach and duodenum, unspecified: Secondary | ICD-10-CM | POA: Insufficient documentation

## 2013-12-31 DIAGNOSIS — E119 Type 2 diabetes mellitus without complications: Secondary | ICD-10-CM | POA: Insufficient documentation

## 2013-12-31 DIAGNOSIS — I8500 Esophageal varices without bleeding: Secondary | ICD-10-CM

## 2013-12-31 HISTORY — DX: Other specified disorders of eye and adnexa: H57.89

## 2013-12-31 HISTORY — PX: ESOPHAGOGASTRODUODENOSCOPY: SHX5428

## 2013-12-31 LAB — GLUCOSE, CAPILLARY: Glucose-Capillary: 110 mg/dL — ABNORMAL HIGH (ref 70–99)

## 2013-12-31 SURGERY — EGD (ESOPHAGOGASTRODUODENOSCOPY)
Anesthesia: Moderate Sedation

## 2013-12-31 MED ORDER — MEPERIDINE HCL 50 MG/ML IJ SOLN
INTRAMUSCULAR | Status: DC | PRN
  Administered 2013-12-31 (×2): 25 mg via INTRAVENOUS

## 2013-12-31 MED ORDER — MIDAZOLAM HCL 5 MG/5ML IJ SOLN
INTRAMUSCULAR | Status: AC
  Filled 2013-12-31: qty 5

## 2013-12-31 MED ORDER — MIDAZOLAM HCL 5 MG/5ML IJ SOLN
INTRAMUSCULAR | Status: AC
  Filled 2013-12-31: qty 10

## 2013-12-31 MED ORDER — SODIUM CHLORIDE 0.9 % IV SOLN
INTRAVENOUS | Status: DC
  Administered 2013-12-31: 11:00:00 via INTRAVENOUS

## 2013-12-31 MED ORDER — BUTAMBEN-TETRACAINE-BENZOCAINE 2-2-14 % EX AERO
INHALATION_SPRAY | CUTANEOUS | Status: DC | PRN
  Administered 2013-12-31: 2 via TOPICAL

## 2013-12-31 MED ORDER — MEPERIDINE HCL 50 MG/ML IJ SOLN
INTRAMUSCULAR | Status: AC
  Filled 2013-12-31: qty 1

## 2013-12-31 MED ORDER — STERILE WATER FOR IRRIGATION IR SOLN
Status: DC | PRN
  Administered 2013-12-31: 12:00:00

## 2013-12-31 MED ORDER — MIDAZOLAM HCL 5 MG/5ML IJ SOLN
INTRAMUSCULAR | Status: DC | PRN
  Administered 2013-12-31: 2 mg via INTRAVENOUS
  Administered 2013-12-31 (×2): 3 mg via INTRAVENOUS
  Administered 2013-12-31: 2 mg via INTRAVENOUS

## 2013-12-31 NOTE — H&P (Signed)
Maria [name] is an 58 y.o. female.   Chief Complaint: Patient is here for EGD and possible esophageal variceal banding. HPI: Patient is 58 year old Caucasian female who was cirrhosis secondary to NAFLD complicated by esophageal variceal bleeds. First bleed occurred in November 2011 and second bleed occurred in November 2013. She has been undergoing periodic variceal banding. Last session was on September 2014. She denies nausea vomiting dysphagia melena or rectal bleeding. He has been walking regularly but she is having left hip pain and states she may need hip replacement at some point.  Past Medical History  Diagnosis Date  . Cirrhosis   . NAFLD (nonalcoholic fatty liver disease)   . Esophageal bleed, non-variceal   . Hypertension   . Hypothyroidism   . Fibromyalgia   . Osteoarthrosis   . Diabetes mellitus   . UTI (urinary tract infection) 11/12 end of the month    Patient feels that she passed a kidney stone at that time  . PONV (postoperative nausea and vomiting)   . Anemia   . Eye hemorrhage     Behind left eye    Past Surgical History  Procedure Laterality Date  . Esophagogastroduodenoscopy w/ banding  08/2010  . Appendectomy  1980  . Tonsillectomy    . Bilateral salpingoophorectomy    . Vaginal hysterectomy    . Colonoscopy  03/15/2011  . Upper gastrointestinal endoscopy  03/15/2011    EGD ED BANDING/TCS  . Upper gastrointestinal endoscopy  09/05/2010  . Upper gastrointestinal endoscopy  08/11/2010  . Esophagogastroduodenoscopy  04/24/2012    Procedure: ESOPHAGOGASTRODUODENOSCOPY (EGD);  Surgeon: Maria Houston, MD;  [facility];  Service: Endoscopy;  Laterality: N/A;  300  . Esophageal banding  04/24/2012    Procedure: ESOPHAGEAL BANDING;  Surgeon: Maria Houston, MD;  [facility];  Service: Endoscopy;  Laterality: N/A;  . Esophageal banding  08/03/2012    [facility] in [place] , [place]  . Esophagogastroduodenoscopy (egd) with  propofol  09/24/2012    Procedure: ESOPHAGOGASTRODUODENOSCOPY (EGD) WITH PROPOFOL;  Surgeon: Maria Houston, MD;  Location: [facility];  Service: Endoscopy;  Laterality: N/A;  GE junction at 36  . Esophageal banding  09/24/2012    Procedure: ESOPHAGEAL BANDING;  Surgeon: Maria Houston, MD;  Location: [facility];  Service: Endoscopy;  Laterality: N/A;  Banding x 3  . Esophagogastroduodenoscopy N/A 01/29/2013    Procedure: ESOPHAGOGASTRODUODENOSCOPY (EGD);  Surgeon: Maria Houston, MD;  [facility];  Service: Endoscopy;  Laterality: N/A;  1200  . Esophageal banding N/A 01/29/2013    Procedure: ESOPHAGEAL BANDING;  Surgeon: Maria Houston, MD;  [facility];  Service: Endoscopy;  Laterality: N/A;  . Cholecystectomy    . Esophagogastroduodenoscopy N/A 05/22/2013    Procedure: ESOPHAGOGASTRODUODENOSCOPY (EGD);  Surgeon: Maria Houston, MD;  [facility];  Service: Endoscopy;  Laterality: N/A;  1:55    Family History  Problem Relation Age of Onset  . Lung cancer Mother   . Diabetes Father   . Diabetes Sister   . Hypertension Sister   . Hypothyroidism Sister   . Colon cancer Brother   . Diabetes Sister   . Hypothyroidism Brother   . Healthy Daughter   . Obesity Daughter   . Hypertension Daughter    Social History:  reports that she has never smoked. She has never used smokeless tobacco. She reports that she does not drink alcohol or use illicit drugs.  Allergies:  No Known Allergies  Medications Prior to Admission  Medication Sig Dispense Refill  . Cholecalciferol (VITAMIN D) 400 UNITS capsule Take 400 Units by mouth daily.       . ferrous sulfate 325 (65 FE) MG tablet Take 325 mg by mouth daily with breakfast.      . furosemide (LASIX) 20 MG tablet Take 1 tablet (20 mg total) by mouth daily as needed.  90 tablet  3  . insulin lispro (HUMALOG) 100 UNIT/ML injection Inject into the skin 3 (three) times daily before meals. Rate of 5 units/hour except smaller dose at  night per patient. Patient may bolus as needed.      Marland Kitchen levothyroxine (SYNTHROID, LEVOTHROID) 25 MCG tablet Take 25 mcg by mouth daily.        Marland Kitchen MILK THISTLE PO Take 450 mg by mouth daily.       . pantoprazole (PROTONIX) 40 MG tablet Take 1 tablet (40 mg total) by mouth daily.  90 tablet  3  . PARoxetine (PAXIL) 40 MG tablet Take 40 mg by mouth every morning.      . propranolol (INDERAL) 20 MG tablet Take 1 tablet (20 mg total) by mouth 2 (two) times daily.  180 tablet  3  . vitamin C (ASCORBIC ACID) 500 MG tablet Take 500 mg by mouth daily.      . vitamin E 400 UNIT capsule Take 400 Units by mouth daily.      . potassium chloride SA (K-DUR,KLOR-CON) 20 MEQ tablet Take 1 tablet (20 mEq total) by mouth daily as needed. Takes when she takes lasix  90 tablet  3    Results for orders placed during the hospital encounter of 12/31/13 (from the past 48 hour(s))  GLUCOSE, CAPILLARY     Status: Abnormal   Collection Time    12/31/13 11:09 AM      Result Value Ref Range   Glucose-Capillary 110 (*) 70 - 99 mg/dL   Comment 1 Documented in Chart     No results found.  ROS  Blood pressure 158/71, pulse 67, temperature 98 F (36.7 C), temperature source Oral, resp. rate 20, height 5' 5"  (1.651 m), weight 232 lb (105.235 kg), SpO2 95.00%. Physical Exam  Constitutional: She appears well-developed and well-nourished.  HENT:  Mouth/Throat: Oropharynx is clear and moist.  Eyes: Conjunctivae are normal. No scleral icterus.  Neck: No thyromegaly present.  Cardiovascular: Normal rate, regular rhythm and normal heart sounds.   No murmur heard. Respiratory: Effort normal and breath sounds normal.  GI: Soft. She exhibits no mass. There is no tenderness.  Musculoskeletal: She exhibits no edema.  Lymphadenopathy:    She has no cervical adenopathy.  Neurological: She is alert.  Skin: Skin is warm and dry.     Assessment/Plan History of esophageal variceal bleed(November 2011 and November  2013). Cirrhosis secondary to NALFD. EGD with possible variceal banding.  Maria [name] 12/31/2013, 11:36 AM

## 2013-12-31 NOTE — Discharge Instructions (Signed)
Resume usual medications and diet. No driving for 24 hours. Office visit in July 2015 as planned. Next EGD in October 2015. Esophagogastroduodenoscopy Care After Refer to this sheet in the next few weeks. These instructions provide you with information on caring for yourself after your procedure. Your caregiver may also give you more specific instructions. Your treatment has been planned according to current medical practices, but problems sometimes occur. Call your caregiver if you have any problems or questions after your procedure.  HOME CARE INSTRUCTIONS  Do not eat or drink anything until the numbing medicine (local anesthetic) has worn off and your gag reflex has returned. You will know that the local anesthetic has worn off when you can swallow comfortably.  Do not drive for 12 hours after the procedure or as directed by your caregiver.  Only take medicines as directed by your caregiver. SEEK MEDICAL CARE IF:   You cannot stop coughing.  You are not urinating at all or less than usual. SEEK IMMEDIATE MEDICAL CARE IF:  You have difficulty swallowing.  You cannot eat or drink.  You have worsening throat or chest pain.  You have dizziness, lightheadedness, or you faint.  You have nausea or vomiting.  You have chills.  You have a fever.  You have severe abdominal pain.  You have black, tarry, or bloody stools. Document Released: 08/21/2012 Document Reviewed: 08/21/2012 [facility] Patient Information 2014 [place], [place].

## 2013-12-31 NOTE — Op Note (Signed)
EGD PROCEDURE REPORT  PATIENT:  Maria [name]  MR#:  [id number] Birthdate:  11-Dec-1955, 58 y.o., female Endoscopist:  Dr. [name], MD Referred By:  Dr. Glenda Chroman, MD Procedure Date: 12/31/2013  Procedure:   EGD  Indications:  Patient is 58 year old Caucasian female with cirrhosis secondary to naphthyl complicated by esophageal variceal bleed. Worsening episode occurred in November 2011 and second episode in November 2013. She is here for EGD with banding if varices have recurred.            Informed Consent:  The risks, benefits, alternatives & imponderables which include, but are not limited to, bleeding, infection, perforation, drug reaction and potential missed lesion have been reviewed.  The potential for biopsy, lesion removal, esophageal dilation, etc. have also been discussed.  Questions have been answered.  All parties agreeable.  Please see history & physical in medical record for more information.  Medications:  Demerol 50 mg IV Versed 10 mg IV Cetacaine spray topically for oropharyngeal anesthesia  Description of procedure:  The endoscope was introduced through the mouth and advanced to the second portion of the duodenum without difficulty or limitations. The mucosal surfaces were surveyed very carefully during advancement of the scope and upon withdrawal.  Findings:  Esophagus:  Mucosa of the proximal and middle third was normal. Focal scarring noted in the distal esophagus from previous banding small varices noted involving distal 3 cm but none large enough to be banded. GEJ:  35 cm Stomach:  Stomach was empty and distended very well with insufflation. Folds in the proximal stomach are normal. Examination of mucosa revealed mosaic pattern at gastric body. Antral mucosa revealed nodularity and scattered telangiectasia without active bleeding. Pyloric showed was patent. Angularis fundus and cardia were examined by retroflex of the scope. No other abnormality  noted. Duodenum:  Normal bulbar and post bulbar mucosa.  Therapeutic/Diagnostic Maneuvers Performed:  None  Complications:  None  Impression: Small esophageal varices involving distal 3 cm of the superficial mucosa not large enough to be banded. Focal scarring or distal esophagus from previous banding. Portal gastropathy.  Recommendations:  Standard instructions given. She will return for repeat EGD in 6 months.  [name]  12/31/2013  12:07 PM  CC: Dr. Glenda Chroman., MD & Dr. Rayne Du ref. provider found

## 2014-01-05 ENCOUNTER — Encounter (HOSPITAL_COMMUNITY): Payer: Self-pay | Admitting: Internal Medicine

## 2014-03-04 ENCOUNTER — Encounter (INDEPENDENT_AMBULATORY_CARE_PROVIDER_SITE_OTHER): Payer: Medicare Other | Admitting: Ophthalmology

## 2014-03-04 DIAGNOSIS — I10 Essential (primary) hypertension: Secondary | ICD-10-CM

## 2014-03-04 DIAGNOSIS — H2510 Age-related nuclear cataract, unspecified eye: Secondary | ICD-10-CM

## 2014-03-04 DIAGNOSIS — E1139 Type 2 diabetes mellitus with other diabetic ophthalmic complication: Secondary | ICD-10-CM

## 2014-03-04 DIAGNOSIS — E11319 Type 2 diabetes mellitus with unspecified diabetic retinopathy without macular edema: Secondary | ICD-10-CM

## 2014-03-04 DIAGNOSIS — E1165 Type 2 diabetes mellitus with hyperglycemia: Secondary | ICD-10-CM

## 2014-03-04 DIAGNOSIS — H43819 Vitreous degeneration, unspecified eye: Secondary | ICD-10-CM

## 2014-03-04 DIAGNOSIS — H35039 Hypertensive retinopathy, unspecified eye: Secondary | ICD-10-CM

## 2014-03-09 ENCOUNTER — Ambulatory Visit (INDEPENDENT_AMBULATORY_CARE_PROVIDER_SITE_OTHER): Payer: Medicare Other | Admitting: Internal Medicine

## 2014-03-23 ENCOUNTER — Encounter (INDEPENDENT_AMBULATORY_CARE_PROVIDER_SITE_OTHER): Payer: Self-pay | Admitting: *Deleted

## 2014-03-23 ENCOUNTER — Ambulatory Visit (INDEPENDENT_AMBULATORY_CARE_PROVIDER_SITE_OTHER): Payer: Medicare Other | Admitting: Internal Medicine

## 2014-03-23 ENCOUNTER — Encounter (INDEPENDENT_AMBULATORY_CARE_PROVIDER_SITE_OTHER): Payer: Self-pay | Admitting: Internal Medicine

## 2014-03-23 VITALS — BP 116/70 | HR 74 | Temp 98.3°F | Resp 18 | Ht 65.0 in | Wt 241.1 lb

## 2014-03-23 DIAGNOSIS — E8779 Other fluid overload: Secondary | ICD-10-CM

## 2014-03-23 DIAGNOSIS — K7460 Unspecified cirrhosis of liver: Secondary | ICD-10-CM

## 2014-03-23 DIAGNOSIS — R609 Edema, unspecified: Secondary | ICD-10-CM

## 2014-03-23 DIAGNOSIS — D509 Iron deficiency anemia, unspecified: Secondary | ICD-10-CM

## 2014-03-23 DIAGNOSIS — K219 Gastro-esophageal reflux disease without esophagitis: Secondary | ICD-10-CM

## 2014-03-23 NOTE — Progress Notes (Signed)
Presenting complaint;  Followup for cirrhosis.  Subjective:  Patient is 58 year old Caucasian female with cirrhosis secondary to NAFLD complicated by esophageal variceal bleed who presents for scheduled visit company by her husband Ronalee Belts. Her last visit was in December 2014 but she did undergo EGD on 12/31/2013 revealing small varices, not large enough to be banded. She has no complaints today. She says her appetite is fair. She has occasional epigastric pain and nausea. She also has right-sided pain which she believes is secondary to flatus. She denies rectal bleeding. Her stools are dark but she takes iron but she does not take daily. She is taking furosemide 5-6 times in a month. She does not believe that her insulin dose has gone up. Her heart tones well controlled with PPI. She is drinking 2 cups of coffee a day. She has not been walking or exercising as she was doing in the past. Her weight is up by 1 pound.   Current Medications: Outpatient Encounter Prescriptions as of 03/23/2014  Medication Sig  . Cholecalciferol (VITAMIN D) 400 UNITS capsule Take 400 Units by mouth daily.   . ferrous sulfate 325 (65 FE) MG tablet Take 325 mg by mouth daily with breakfast.  . furosemide (LASIX) 20 MG tablet Take 1 tablet (20 mg total) by mouth daily as needed.  . insulin lispro (HUMALOG) 100 UNIT/ML injection Inject into the skin 3 (three) times daily before meals. Rate of 5 units/hour except smaller dose at night per patient. Patient may bolus as needed.  Marland Kitchen levothyroxine (SYNTHROID, LEVOTHROID) 25 MCG tablet Take 25 mcg by mouth daily.    . Magnesium 400 MG CAPS Take 400 mg by mouth. Patient states that she takes twice a week.  Marland Kitchen MILK THISTLE PO Take 450 mg by mouth daily.   . pantoprazole (PROTONIX) 40 MG tablet Take 1 tablet (40 mg total) by mouth daily.  Marland Kitchen PARoxetine (PAXIL) 40 MG tablet Take 40 mg by mouth every morning.  . potassium chloride SA (K-DUR,KLOR-CON) 20 MEQ tablet Take 1 tablet (20 mEq  total) by mouth daily as needed. Takes when she takes lasix  . propranolol (INDERAL) 20 MG tablet Take 1 tablet (20 mg total) by mouth 2 (two) times daily.  . vitamin C (ASCORBIC ACID) 500 MG tablet Take 500 mg by mouth daily.  . vitamin E 400 UNIT capsule Take 400 Units by mouth daily.     Objective: Blood pressure 116/70, pulse 74, temperature 98.3 F (36.8 C), temperature source Oral, resp. rate 18, height 5' 5"  (1.651 m), weight 241 lb 1.6 oz (109.362 kg). Patient is alert and in no acute distress. Asterixis absent. Conjunctiva is pink. Sclera is nonicteric Oropharyngeal mucosa is normal. No neck masses or thyromegaly noted. Cardiac exam with regular rhythm normal S1 and S2. Faint systolic ejection murmur best heard at LLSB.  Lungs are clear to auscultation. Abdomen is full but soft and nontender without organomegaly or masses. Insulin pump in place with butterfly needle this along left side of abdomen. Trace edema around ankles.  Labs/studies Results: Last CBC comprehensive chemistry panel and AFP were on 11/07/2013. Last ultrasound was on 05/05/2013.    Assessment:  #1. Cirrhosis secondary to NAFLD complicated by 2 episodes of esophageal bleed. First episode occurred in November 20 11th and second episode in November 2013. She has undergone periodic variceal banding but none were found on EGD of 7 weeks ago. She is due for [facility] screening. Patient needs to be more active and she must lose weight  in order to improve her long-term prognosis. She has been evaluated at liver program at [facility]. MELD score has been low. #2. History of IDA. She needs in February this year was 11.1 and 34.7. She is not taking iron daily. #3. GERD. Symptoms well controlled with therapy.  Plan: Need for increase physical activity and weight reduction stressed to the patient. Patient will also talk with her endocrinologist as she may have to adjust her insulin with increased activity. Patient will have  CBC, comprehensive chemistry panel and AFP in the next 3-4 weeks. Abdominal ultrasound for [facility] screening and also to look for ascites. EGD with variceal banding in October 2015. Office visit in 6 months.

## 2014-03-23 NOTE — Patient Instructions (Signed)
Physician will call with results of blood work and ultrasound when completed. EGD to be scheduled in the mid October 2015.

## 2014-03-26 ENCOUNTER — Ambulatory Visit (HOSPITAL_COMMUNITY)
Admission: RE | Admit: 2014-03-26 | Discharge: 2014-03-26 | Disposition: A | Discharge status: Home / Self Care | Payer: Medicare Other | Source: Ambulatory Visit | Attending: Internal Medicine | Admitting: Internal Medicine

## 2014-03-26 DIAGNOSIS — K7460 Unspecified cirrhosis of liver: Secondary | ICD-10-CM

## 2014-03-31 ENCOUNTER — Other Ambulatory Visit (INDEPENDENT_AMBULATORY_CARE_PROVIDER_SITE_OTHER): Payer: Self-pay | Admitting: Internal Medicine

## 2014-03-31 DIAGNOSIS — K7460 Unspecified cirrhosis of liver: Secondary | ICD-10-CM

## 2014-03-31 DIAGNOSIS — R935 Abnormal findings on diagnostic imaging of other abdominal regions, including retroperitoneum: Secondary | ICD-10-CM

## 2014-04-01 LAB — CBC
HCT: 35.6 % — ABNORMAL LOW (ref 36.0–46.0)
Hemoglobin: 11.9 g/dL — ABNORMAL LOW (ref 12.0–15.0)
MCH: 25.4 pg — ABNORMAL LOW (ref 26.0–34.0)
MCHC: 33.4 g/dL (ref 30.0–36.0)
MCV: 75.9 fL — ABNORMAL LOW (ref 78.0–100.0)
RBC: 4.69 MIL/uL (ref 3.87–5.11)
RDW: 17.6 % — ABNORMAL HIGH (ref 11.5–15.5)
WBC: 4.3 10*3/uL (ref 4.0–10.5)

## 2014-04-01 LAB — COMPREHENSIVE METABOLIC PANEL
ALT: 22 U/L (ref 0–35)
AST: 30 U/L (ref 0–37)
Albumin: 3.7 g/dL (ref 3.5–5.2)
Alkaline Phosphatase: 92 U/L (ref 39–117)
BUN: 6 mg/dL (ref 6–23)
CO2: 26 mEq/L (ref 19–32)
Calcium: 8.9 mg/dL (ref 8.4–10.5)
Chloride: 107 mEq/L (ref 96–112)
Creat: 0.66 mg/dL (ref 0.50–1.10)
Glucose, Bld: 126 mg/dL — ABNORMAL HIGH (ref 70–99)
Potassium: 3.8 mEq/L (ref 3.5–5.3)
Sodium: 141 mEq/L (ref 135–145)
Total Bilirubin: 1.1 mg/dL (ref 0.2–1.2)
Total Protein: 6.9 g/dL (ref 6.0–8.3)

## 2014-04-02 ENCOUNTER — Encounter (INDEPENDENT_AMBULATORY_CARE_PROVIDER_SITE_OTHER): Payer: Self-pay | Admitting: Internal Medicine

## 2014-04-02 LAB — AFP TUMOR MARKER: AFP-Tumor Marker: 4.4 ng/mL (ref 0.0–8.0)

## 2014-04-03 ENCOUNTER — Ambulatory Visit (HOSPITAL_COMMUNITY)
Admission: RE | Admit: 2014-04-03 | Discharge: 2014-04-03 | Disposition: A | Discharge status: Home / Self Care | Payer: Medicare Other | Source: Ambulatory Visit | Attending: Internal Medicine | Admitting: Internal Medicine

## 2014-04-03 ENCOUNTER — Encounter (HOSPITAL_COMMUNITY): Payer: Self-pay

## 2014-04-03 DIAGNOSIS — R935 Abnormal findings on diagnostic imaging of other abdominal regions, including retroperitoneum: Secondary | ICD-10-CM

## 2014-04-03 DIAGNOSIS — I8510 Secondary esophageal varices without bleeding: Secondary | ICD-10-CM | POA: Insufficient documentation

## 2014-04-03 DIAGNOSIS — K7460 Unspecified cirrhosis of liver: Secondary | ICD-10-CM | POA: Insufficient documentation

## 2014-04-03 DIAGNOSIS — R9389 Abnormal findings on diagnostic imaging of other specified body structures: Secondary | ICD-10-CM | POA: Insufficient documentation

## 2014-04-03 DIAGNOSIS — R161 Splenomegaly, not elsewhere classified: Secondary | ICD-10-CM | POA: Insufficient documentation

## 2014-04-03 MED ORDER — GADOBENATE DIMEGLUMINE 529 MG/ML IV SOLN
20.0000 mL | Freq: Once | INTRAVENOUS | Status: AC | PRN
  Administered 2014-04-03: 20 mL via INTRAVENOUS

## 2014-04-13 ENCOUNTER — Ambulatory Visit: Payer: Medicare Other | Attending: Internal Medicine | Admitting: Physical Therapy

## 2014-04-13 DIAGNOSIS — E119 Type 2 diabetes mellitus without complications: Secondary | ICD-10-CM | POA: Diagnosis not present

## 2014-04-13 DIAGNOSIS — R5381 Other malaise: Secondary | ICD-10-CM | POA: Insufficient documentation

## 2014-04-13 DIAGNOSIS — I10 Essential (primary) hypertension: Secondary | ICD-10-CM | POA: Insufficient documentation

## 2014-04-13 DIAGNOSIS — M545 Low back pain, unspecified: Secondary | ICD-10-CM | POA: Insufficient documentation

## 2014-04-13 DIAGNOSIS — IMO0001 Reserved for inherently not codable concepts without codable children: Secondary | ICD-10-CM | POA: Diagnosis present

## 2014-04-13 DIAGNOSIS — K769 Liver disease, unspecified: Secondary | ICD-10-CM | POA: Insufficient documentation

## 2014-04-13 DIAGNOSIS — M25559 Pain in unspecified hip: Secondary | ICD-10-CM | POA: Diagnosis not present

## 2014-04-13 DIAGNOSIS — M25569 Pain in unspecified knee: Secondary | ICD-10-CM | POA: Diagnosis not present

## 2014-04-15 ENCOUNTER — Ambulatory Visit: Payer: Medicare Other | Admitting: Physical Therapy

## 2014-04-15 DIAGNOSIS — IMO0001 Reserved for inherently not codable concepts without codable children: Secondary | ICD-10-CM | POA: Diagnosis not present

## 2014-04-17 ENCOUNTER — Ambulatory Visit: Payer: Medicare Other | Admitting: Physical Therapy

## 2014-04-17 DIAGNOSIS — IMO0001 Reserved for inherently not codable concepts without codable children: Secondary | ICD-10-CM | POA: Diagnosis not present

## 2014-04-20 ENCOUNTER — Ambulatory Visit: Payer: Medicare Other | Attending: Internal Medicine | Admitting: Physical Therapy

## 2014-04-20 DIAGNOSIS — I10 Essential (primary) hypertension: Secondary | ICD-10-CM | POA: Diagnosis not present

## 2014-04-20 DIAGNOSIS — R5381 Other malaise: Secondary | ICD-10-CM | POA: Diagnosis not present

## 2014-04-20 DIAGNOSIS — M545 Low back pain, unspecified: Secondary | ICD-10-CM | POA: Insufficient documentation

## 2014-04-20 DIAGNOSIS — K769 Liver disease, unspecified: Secondary | ICD-10-CM | POA: Insufficient documentation

## 2014-04-20 DIAGNOSIS — M25569 Pain in unspecified knee: Secondary | ICD-10-CM | POA: Diagnosis not present

## 2014-04-20 DIAGNOSIS — E119 Type 2 diabetes mellitus without complications: Secondary | ICD-10-CM | POA: Insufficient documentation

## 2014-04-20 DIAGNOSIS — M25559 Pain in unspecified hip: Secondary | ICD-10-CM | POA: Insufficient documentation

## 2014-04-20 DIAGNOSIS — IMO0001 Reserved for inherently not codable concepts without codable children: Secondary | ICD-10-CM | POA: Diagnosis not present

## 2014-04-22 ENCOUNTER — Ambulatory Visit: Payer: Medicare Other | Admitting: Physical Therapy

## 2014-04-22 DIAGNOSIS — IMO0001 Reserved for inherently not codable concepts without codable children: Secondary | ICD-10-CM | POA: Diagnosis not present

## 2014-06-11 ENCOUNTER — Telehealth (INDEPENDENT_AMBULATORY_CARE_PROVIDER_SITE_OTHER): Payer: Self-pay | Admitting: *Deleted

## 2014-06-11 ENCOUNTER — Other Ambulatory Visit (INDEPENDENT_AMBULATORY_CARE_PROVIDER_SITE_OTHER): Payer: Self-pay | Admitting: *Deleted

## 2014-06-11 DIAGNOSIS — K7460 Unspecified cirrhosis of liver: Secondary | ICD-10-CM

## 2014-06-11 DIAGNOSIS — I8500 Esophageal varices without bleeding: Secondary | ICD-10-CM

## 2014-06-11 NOTE — Telephone Encounter (Signed)
Referring MD/PCP: vyas   Procedure: egd w/ banding  Reason/Indication:  Cirrhosis, esophageal varcies  Has patient had this procedure before?  Yes, 12/2013 -- epic  If so, when, by whom and where?    Is there a family history of colon cancer?    Who?  What age when diagnosed?    Is patient diabetic?   no      Does patient have prosthetic heart valve?  no  Do you have a pacemaker?  no  Has patient ever had endocarditis? no  Has patient had joint replacement within last 12 months?  no  Does patient tend to be constipated or take laxatives?   Is patient on Coumadin, Plavix and/or Aspirin? no  Medications: see epic  Allergies: nkda  Medication Adjustment:   Procedure date & time: 06/19/14 at 1055

## 2014-06-15 NOTE — Telephone Encounter (Signed)
agree

## 2014-06-16 ENCOUNTER — Encounter (HOSPITAL_COMMUNITY): Payer: Self-pay | Admitting: Pharmacy Technician

## 2014-06-19 ENCOUNTER — Encounter (HOSPITAL_COMMUNITY): Payer: Self-pay | Admitting: *Deleted

## 2014-06-19 ENCOUNTER — Ambulatory Visit (HOSPITAL_COMMUNITY)
Admission: RE | Admit: 2014-06-19 | Discharge: 2014-06-19 | Disposition: A | Discharge status: Home / Self Care | Payer: Medicare Other | Source: Ambulatory Visit | Attending: Internal Medicine | Admitting: Internal Medicine

## 2014-06-19 ENCOUNTER — Encounter (HOSPITAL_COMMUNITY)
Admission: RE | Disposition: A | Discharge status: Home / Self Care | Payer: Self-pay | Source: Ambulatory Visit | Attending: Internal Medicine

## 2014-06-19 DIAGNOSIS — K7469 Other cirrhosis of liver: Secondary | ICD-10-CM

## 2014-06-19 DIAGNOSIS — I8510 Secondary esophageal varices without bleeding: Secondary | ICD-10-CM | POA: Diagnosis not present

## 2014-06-19 DIAGNOSIS — I8500 Esophageal varices without bleeding: Secondary | ICD-10-CM

## 2014-06-19 DIAGNOSIS — D509 Iron deficiency anemia, unspecified: Secondary | ICD-10-CM

## 2014-06-19 DIAGNOSIS — Z8719 Personal history of other diseases of the digestive system: Secondary | ICD-10-CM

## 2014-06-19 DIAGNOSIS — E119 Type 2 diabetes mellitus without complications: Secondary | ICD-10-CM | POA: Insufficient documentation

## 2014-06-19 DIAGNOSIS — Z79899 Other long term (current) drug therapy: Secondary | ICD-10-CM | POA: Insufficient documentation

## 2014-06-19 DIAGNOSIS — K7460 Unspecified cirrhosis of liver: Secondary | ICD-10-CM | POA: Insufficient documentation

## 2014-06-19 DIAGNOSIS — E039 Hypothyroidism, unspecified: Secondary | ICD-10-CM | POA: Insufficient documentation

## 2014-06-19 DIAGNOSIS — Z794 Long term (current) use of insulin: Secondary | ICD-10-CM | POA: Insufficient documentation

## 2014-06-19 DIAGNOSIS — K766 Portal hypertension: Secondary | ICD-10-CM | POA: Insufficient documentation

## 2014-06-19 DIAGNOSIS — I10 Essential (primary) hypertension: Secondary | ICD-10-CM | POA: Diagnosis not present

## 2014-06-19 DIAGNOSIS — K3189 Other diseases of stomach and duodenum: Secondary | ICD-10-CM

## 2014-06-19 DIAGNOSIS — D649 Anemia, unspecified: Secondary | ICD-10-CM | POA: Diagnosis not present

## 2014-06-19 HISTORY — DX: Age-related osteoporosis without current pathological fracture: M81.0

## 2014-06-19 HISTORY — PX: ESOPHAGEAL BANDING: SHX5518

## 2014-06-19 HISTORY — PX: ESOPHAGOGASTRODUODENOSCOPY: SHX5428

## 2014-06-19 LAB — HEMOGLOBIN AND HEMATOCRIT, BLOOD
HCT: 33.2 % — ABNORMAL LOW (ref 36.0–46.0)
Hemoglobin: 10.6 g/dL — ABNORMAL LOW (ref 12.0–15.0)

## 2014-06-19 LAB — GLUCOSE, CAPILLARY: Glucose-Capillary: 107 mg/dL — ABNORMAL HIGH (ref 70–99)

## 2014-06-19 LAB — IRON AND TIBC
Iron: 48 ug/dL (ref 42–135)
Saturation Ratios: 11 % — ABNORMAL LOW (ref 20–55)
TIBC: 424 ug/dL (ref 250–470)
UIBC: 376 ug/dL (ref 125–400)

## 2014-06-19 LAB — FERRITIN: Ferritin: 5 ng/mL — ABNORMAL LOW (ref 10–291)

## 2014-06-19 SURGERY — EGD (ESOPHAGOGASTRODUODENOSCOPY)
Anesthesia: Moderate Sedation

## 2014-06-19 MED ORDER — MEPERIDINE HCL 50 MG/ML IJ SOLN
INTRAMUSCULAR | Status: AC
  Filled 2014-06-19: qty 1

## 2014-06-19 MED ORDER — MEPERIDINE HCL 50 MG/ML IJ SOLN
INTRAMUSCULAR | Status: DC | PRN
Start: 2014-06-19 — End: 2014-06-19
  Administered 2014-06-19 (×2): 25 mg

## 2014-06-19 MED ORDER — MIDAZOLAM HCL 5 MG/5ML IJ SOLN
INTRAMUSCULAR | Status: DC | PRN
  Administered 2014-06-19: 2 mg via INTRAVENOUS
  Administered 2014-06-19: 1 mg via INTRAVENOUS
  Administered 2014-06-19: 3 mg via INTRAVENOUS
  Administered 2014-06-19 (×2): 2 mg via INTRAVENOUS

## 2014-06-19 MED ORDER — BUTAMBEN-TETRACAINE-BENZOCAINE 2-2-14 % EX AERO
INHALATION_SPRAY | CUTANEOUS | Status: DC | PRN
  Administered 2014-06-19: 2 via TOPICAL

## 2014-06-19 MED ORDER — SODIUM CHLORIDE 0.9 % IV SOLN
INTRAVENOUS | Status: DC
  Administered 2014-06-19: 11:00:00 via INTRAVENOUS

## 2014-06-19 MED ORDER — MIDAZOLAM HCL 5 MG/5ML IJ SOLN
INTRAMUSCULAR | Status: AC
  Filled 2014-06-19: qty 10

## 2014-06-19 NOTE — Discharge Instructions (Signed)
Resume usual medications and diet. No driving for 24 hours. Esophagogastroduodenoscopy Esophagogastroduodenoscopy (EGD) is a procedure to examine the lining of the esophagus, stomach, and first part of the small intestine (duodenum). A long, flexible, lighted tube with a camera attached (endoscope) is inserted down the throat to view these organs. This procedure is done to detect problems or abnormalities, such as inflammation, bleeding, ulcers, or growths, in order to treat them. The procedure lasts about 5-20 minutes. It is usually an outpatient procedure, but it may need to be performed in emergency cases in the hospital. LET YOUR CAREGIVER KNOW ABOUT:   Allergies to food or medicine.  All medicines you are taking, including vitamins, herbs, eyedrops, and over-the-counter medicines and creams.  Use of steroids (by mouth or creams).  Previous problems you or members of your family have had with the use of anesthetics.  Any blood disorders you have.  Previous surgeries you have had.  Other health problems you have.  Possibility of pregnancy, if this applies. RISKS AND COMPLICATIONS  Generally, EGD is a safe procedure. However, as with any procedure, complications can occur. Possible complications include:  Infection.  Bleeding.  Tearing (perforation) of the esophagus, stomach, or duodenum.  Difficulty breathing or not being able to breath.  Excessive sweating.  Spasms of the larynx.  Slowed heartbeat.  Low blood pressure. BEFORE THE PROCEDURE  Do not eat or drink anything for 6-8 hours before the procedure or as directed by your caregiver.  Ask your caregiver about changing or stopping your regular medicines.  If you wear dentures, be prepared to remove them before the procedure.  Arrange for someone to drive you home after the procedure. PROCEDURE   A vein will be accessed to give medicines and fluids. A medicine to relax you (sedative) and a pain reliever will be  given through that access into the vein.  A numbing medicine (local anesthetic) may be sprayed on your throat for comfort and to stop you from gagging or coughing.  A mouth guard may be placed in your mouth to protect your teeth and to keep you from biting on the endoscope.  You will be asked to lie on your left side.  The endoscope is inserted down your throat and into the esophagus, stomach, and duodenum.  Air is put through the endoscope to allow your caregiver to view the lining of your esophagus clearly.  The esophagus, stomach, and duodenum is then examined. During the exam, your caregiver may:  Remove tissue to be examined under a microscope (biopsy) for inflammation, infection, or other medical problems.  Remove growths.  Remove objects (foreign bodies) that are stuck.  Treat any bleeding with medicines or other devices that stop tissues from bleeding (hot cautery, clipping devices).  Widen (dilate) or stretch narrowed areas of the esophagus and stomach.  The endoscope will then be withdrawn. AFTER THE PROCEDURE  You will be taken to a recovery area to be monitored. You will be able to go home once you are stable and alert.  Do not eat or drink anything until the local anesthetic and numbing medicines have worn off. You may choke.  It is normal to feel bloated, have pain with swallowing, or have a sore throat for a short time. This will wear off.  Your caregiver should be able to discuss his or her findings with you. It will take longer to discuss the test results if any biopsies were taken.

## 2014-06-19 NOTE — OR Nursing (Signed)
Patient in for an EGD. Blood sugar 107 on arrival. Patient has an insulin pump with Humalog 5 units/hour. Dr. Laural Golden notified and said to have patient decrease rate to 3 units/hour. Patient changed rate.

## 2014-06-19 NOTE — H&P (Signed)
Maria [name] is an 58 y.o. female.   Chief Complaint: Patient is here for EGD and possible esophageal variceal banding. HPI: Patient is a 58 year old Caucasian female with cirrhosis secondary to NAFLD complicated by esophageal variceal bleed x2. She is here for esophageal variceal banding. Last EGD was in April of this year she did not require banding. She denies nausea vomiting hematemesis melena abdominal pain. She says she has craving for ice and wonders if she is iron deficient again.  Past Medical History  Diagnosis Date  . Cirrhosis   . NAFLD (nonalcoholic fatty liver disease)   . Esophageal bleed, non-variceal   . Hypertension   . Hypothyroidism   . Fibromyalgia   . Osteoarthrosis   . Diabetes mellitus   . UTI (urinary tract infection) 11/12 end of the month    Patient feels that she passed a kidney stone at that time  . PONV (postoperative nausea and vomiting)   . Anemia   . Eye hemorrhage     Behind left eye  . Osteoporosis     Past Surgical History  Procedure Laterality Date  . Esophagogastroduodenoscopy w/ banding  08/2010  . Appendectomy  1980  . Tonsillectomy    . Bilateral salpingoophorectomy    . Vaginal hysterectomy    . Colonoscopy  03/15/2011  . Upper gastrointestinal endoscopy  03/15/2011    EGD ED BANDING/TCS  . Upper gastrointestinal endoscopy  09/05/2010  . Upper gastrointestinal endoscopy  08/11/2010  . Esophagogastroduodenoscopy  04/24/2012    Procedure: ESOPHAGOGASTRODUODENOSCOPY (EGD);  Surgeon: [name], MD;  [facility];  Service: Endoscopy;  Laterality: N/A;  300  . Esophageal banding  04/24/2012    Procedure: ESOPHAGEAL BANDING;  Surgeon: [name], MD;  Location: [facility];  Service: Endoscopy;  Laterality: N/A;  . Esophageal banding  08/03/2012    [facility] in [place] , [place]  . Esophagogastroduodenoscopy (egd) with propofol  09/24/2012    Procedure: ESOPHAGOGASTRODUODENOSCOPY (EGD) WITH PROPOFOL;   Surgeon: [name], MD;  Location: [facility];  Service: Endoscopy;  Laterality: N/A;  GE junction at 36  . Esophageal banding  09/24/2012    Procedure: ESOPHAGEAL BANDING;  Surgeon: [name], MD;  Location: [facility];  Service: Endoscopy;  Laterality: N/A;  Banding x 3  . Esophagogastroduodenoscopy N/A 01/29/2013    Procedure: ESOPHAGOGASTRODUODENOSCOPY (EGD);  Surgeon: [name], MD;  [facility];  Service: Endoscopy;  Laterality: N/A;  1200  . Esophageal banding N/A 01/29/2013    Procedure: ESOPHAGEAL BANDING;  Surgeon: [name], MD;  [facility];  Service: Endoscopy;  Laterality: N/A;  . Cholecystectomy    . Esophagogastroduodenoscopy N/A 05/22/2013    Procedure: ESOPHAGOGASTRODUODENOSCOPY (EGD);  Surgeon: [name], MD;  [facility];  Service: Endoscopy;  Laterality: N/A;  1:55  . Esophagogastroduodenoscopy N/A 12/31/2013    Procedure: ESOPHAGOGASTRODUODENOSCOPY (EGD);  Surgeon: [name], MD;  [facility];  Service: Endoscopy;  Laterality: N/A;  1200    Family History  Problem Relation Age of Onset  . Lung cancer Mother   . Diabetes Father   . Diabetes Sister   . Hypertension Sister   . Hypothyroidism Sister   . Colon cancer Brother   . Diabetes Sister   . Hypothyroidism Brother   . Healthy Daughter   . Obesity Daughter   . Hypertension Daughter    Social History:  reports that she has never  smoked. She has never used smokeless tobacco. She reports that she does not drink alcohol or use illicit drugs.  Allergies: No Known Allergies  Medications Prior to Admission  Medication Sig Dispense Refill  . Calcium Carb-Cholecalciferol (OS-CAL CALCIUM + D3) 500-200 MG-UNIT TABS Take by mouth.      . Cholecalciferol (VITAMIN D) 400 UNITS capsule Take 400 Units by mouth daily.       . ferrous sulfate 325 (65 FE) MG tablet Take 325 mg by mouth daily with breakfast.      . furosemide (LASIX) 20 MG tablet Take 1  tablet (20 mg total) by mouth daily as needed.  90 tablet  3  . insulin lispro (HUMALOG) 100 UNIT/ML injection Inject into the skin 3 (three) times daily before meals. Rate of 5 units/hour except smaller dose at night per patient. Patient may bolus as needed.      Marland Kitchen levothyroxine (SYNTHROID, LEVOTHROID) 25 MCG tablet Take 25 mcg by mouth daily.        . Magnesium 400 MG CAPS Take 400 mg by mouth. Patient states that she takes twice a week.      Marland Kitchen MILK THISTLE PO Take 450 mg by mouth daily.       . pantoprazole (PROTONIX) 40 MG tablet Take 1 tablet (40 mg total) by mouth daily.  90 tablet  3  . PARoxetine (PAXIL) 40 MG tablet Take 40 mg by mouth every morning.      . potassium chloride SA (K-DUR,KLOR-CON) 20 MEQ tablet Take 1 tablet (20 mEq total) by mouth daily as needed. Takes when she takes lasix  90 tablet  3  . propranolol (INDERAL) 20 MG tablet Take 1 tablet (20 mg total) by mouth 2 (two) times daily.  180 tablet  3  . vitamin C (ASCORBIC ACID) 500 MG tablet Take 500 mg by mouth daily.      . vitamin E 400 UNIT capsule Take 400 Units by mouth daily.        Results for orders placed during the hospital encounter of 06/19/14 (from the past 48 hour(s))  GLUCOSE, CAPILLARY     Status: Abnormal   Collection Time    06/19/14 10:39 AM      Result Value Ref Range   Glucose-Capillary 107 (*) 70 - 99 mg/dL   Comment 1 Documented in Chart     No results found.  ROS  Blood pressure 134/60, pulse 73, temperature 97.9 F (36.6 C), temperature source Oral, resp. rate 18, height 5' 5"  (1.651 m), weight 238 lb (107.956 kg), SpO2 99.00%. Physical Exam  Constitutional: She appears well-developed and well-nourished.  HENT:  Mouth/Throat: Oropharynx is clear and moist.  Eyes: Conjunctivae are normal. No scleral icterus.  Neck: No thyromegaly present.  Cardiovascular: Normal rate, regular rhythm and normal heart sounds.   No murmur heard. Respiratory: Effort normal and breath sounds normal.  GI:  Soft. She exhibits no distension and no mass. There is no tenderness.  Musculoskeletal: Edema: trace edema around ankles.  Lymphadenopathy:    She has no cervical adenopathy.  Neurological: She is alert.  Skin: Skin is warm and dry.     Assessment/Plan History of esophageal variceal bleed. Cirrhosis secondary to NAFLD. EGD with possible esophageal variceal banding  Karis Emig U 06/19/2014, 11:17 AM

## 2014-06-19 NOTE — Op Note (Signed)
[facility] [address] [place], [address]   ENDOSCOPY PROCEDURE REPORT  PATIENT: Maria [name], Maria [name]  MR#: [id number] BIRTHDATE: 1956-03-09 , 15  yrs. old GENDER: female ENDOSCOPIST: Hildred Laser, MD REFERRED BY:  Jerene Bears, M.D. PROCEDURE DATE:  Jul 14, 2014 PROCEDURE:  EGD, diagnostic ASA CLASS:     Class I INDICATIONS:  history of esophageal variceal bleed. MEDICATIONS: Cetacaine spray for oral pharyngeal topical anesthesia, Meperidine (Demerol) 50 mg IV, and Versed 10 mg IV TOPICAL ANESTHETIC: Cetacaine Spray  DESCRIPTION OF PROCEDURE: After the risks benefits and alternatives of the procedure were thoroughly explained, informed consent was obtained.  The EG-[id number] ([id number]) endoscope was introduced through the mouth and advanced to the second portion of the duodenum , Without limitations.  The instrument was slowly withdrawn as the mucosa was fully examined.      ESOPHAGUS: There were small varices in the distal esophagus.  STOMACH: Moderate portal hypertensive gastropathy was found.  DUODENUM: The duodenal mucosa showed no abnormalities in the bulb and 2nd part of the duodenum.  Retroflexed views revealed as previously described.     The scope was then withdrawn from the patient and the procedure completed.  COMPLICATIONS: There were no immediate complications.  ENDOSCOPIC IMPRESSION: 1.   Small varices at distal esophagus;banding not needed. 2.   Portal hypertensive gastropathy.   RECOMMENDATIONS: Hemoglobin and hematocrit will be checked today. Serum iron TIBC and ferritin. EGD in 6 months.  REPEAT EXAM:  eSignedHildred Laser, MD 2014/07/14 11:56 AM    CC:  CPT CODES: ICD CODES:  The ICD and CPT codes recommended by this software are interpretations from the data that the clinical staff has captured with the software.  The verification of the translation of this report to the ICD and CPT codes and modifiers is the  sole responsibility of the health care institution and practicing physician where this report was generated.  [employer] will not be held responsible for the validity of the ICD and CPT codes included on this report.  AMA assumes no liability for data contained or not contained herein. CPT is a Designer, television/film set of the [employer].

## 2014-06-22 ENCOUNTER — Other Ambulatory Visit (INDEPENDENT_AMBULATORY_CARE_PROVIDER_SITE_OTHER): Payer: Self-pay | Admitting: *Deleted

## 2014-06-22 ENCOUNTER — Encounter (HOSPITAL_COMMUNITY): Payer: Self-pay | Admitting: Internal Medicine

## 2014-06-22 ENCOUNTER — Telehealth (INDEPENDENT_AMBULATORY_CARE_PROVIDER_SITE_OTHER): Payer: Self-pay | Admitting: *Deleted

## 2014-06-22 ENCOUNTER — Encounter (INDEPENDENT_AMBULATORY_CARE_PROVIDER_SITE_OTHER): Payer: Self-pay | Admitting: *Deleted

## 2014-06-22 DIAGNOSIS — K7469 Other cirrhosis of liver: Secondary | ICD-10-CM

## 2014-06-22 NOTE — Telephone Encounter (Signed)
Per Dr.Rehman the patient will need to have labs drawn in 4 weeks.

## 2014-07-08 ENCOUNTER — Encounter (INDEPENDENT_AMBULATORY_CARE_PROVIDER_SITE_OTHER): Payer: Self-pay | Admitting: *Deleted

## 2014-07-08 ENCOUNTER — Other Ambulatory Visit (INDEPENDENT_AMBULATORY_CARE_PROVIDER_SITE_OTHER): Payer: Self-pay | Admitting: *Deleted

## 2014-07-08 DIAGNOSIS — K7469 Other cirrhosis of liver: Secondary | ICD-10-CM

## 2014-07-22 LAB — HEMOGLOBIN AND HEMATOCRIT, BLOOD
HCT: 37.5 % (ref 36.0–46.0)
Hemoglobin: 12.2 g/dL (ref 12.0–15.0)

## 2014-08-24 ENCOUNTER — Other Ambulatory Visit (INDEPENDENT_AMBULATORY_CARE_PROVIDER_SITE_OTHER): Payer: Self-pay | Admitting: Internal Medicine

## 2014-09-28 ENCOUNTER — Encounter (INDEPENDENT_AMBULATORY_CARE_PROVIDER_SITE_OTHER): Payer: Self-pay | Admitting: Internal Medicine

## 2014-09-28 ENCOUNTER — Ambulatory Visit (INDEPENDENT_AMBULATORY_CARE_PROVIDER_SITE_OTHER): Payer: Medicare Other | Admitting: Internal Medicine

## 2014-09-28 VITALS — BP 130/70 | HR 72 | Temp 98.9°F | Resp 18 | Ht 65.0 in | Wt 233.8 lb

## 2014-09-28 DIAGNOSIS — E8770 Fluid overload, unspecified: Secondary | ICD-10-CM

## 2014-09-28 DIAGNOSIS — D696 Thrombocytopenia, unspecified: Secondary | ICD-10-CM

## 2014-09-28 DIAGNOSIS — R609 Edema, unspecified: Secondary | ICD-10-CM

## 2014-09-28 DIAGNOSIS — I8500 Esophageal varices without bleeding: Secondary | ICD-10-CM

## 2014-09-28 DIAGNOSIS — K7460 Unspecified cirrhosis of liver: Secondary | ICD-10-CM

## 2014-09-28 NOTE — Progress Notes (Signed)
Presenting complaint;  Follow-up for cirrhosis.  Subjective:  Patient is 59 year old Caucasian female with cirrhosis secondary to NAFLD complicated by esophageal variceal bleed(November 2011 and November 2013) who presents for scheduled visit. Last EGD was on 06/19/2014 and she only had small esophageal varices and banding was not performed. She denies melena or rectal bleeding or abdominal pain. Heartburn is occasional. She may also have difficulty swallowing potatoes but she has no difficulty with meat. She does not eat meat often. She may also strangle with liquids. She developed cellulitis involving her last left leg and was treated with 12 days of cephalexin. She has lost 8 pounds since her last visit. She says at one point her weight was down to 220 pounds in home scale but today was back up to 233 pounds. She is trying to walk more than she has in the past. She is taking furosemide on an as-needed basis.   Current Medications: Outpatient Encounter Prescriptions as of 09/28/2014  Medication Sig  . Calcium Carb-Cholecalciferol (OS-CAL CALCIUM + D3) 500-200 MG-UNIT TABS Take by mouth daily.   . ferrous sulfate 325 (65 FE) MG tablet Take 325 mg by mouth daily with breakfast.  . furosemide (LASIX) 20 MG tablet Take 1 tablet (20 mg total) by mouth daily as needed.  . insulin lispro (HUMALOG) 100 UNIT/ML injection Inject into the skin 3 (three) times daily before meals. Rate of 5 units/hour except smaller dose at night per patient. Patient may bolus as needed.  Marland Kitchen levothyroxine (SYNTHROID, LEVOTHROID) 25 MCG tablet Take 25 mcg by mouth daily.    . Magnesium 400 MG CAPS Take 400 mg by mouth daily.   Marland Kitchen MILK THISTLE PO Take 450 mg by mouth daily.   . pantoprazole (PROTONIX) 40 MG tablet TAKE 1 BY MOUTH DAILY  . PARoxetine (PAXIL) 40 MG tablet Take 40 mg by mouth every morning.  . potassium chloride SA (K-DUR,KLOR-CON) 20 MEQ tablet Take 1 tablet (20 mEq total) by mouth daily as needed. Takes when  she takes lasix  . propranolol (INDERAL) 20 MG tablet Take 1 tablet (20 mg total) by mouth 2 (two) times daily.  . vitamin C (ASCORBIC ACID) 500 MG tablet Take 500 mg by mouth daily.  . vitamin E 400 UNIT capsule Take 400 Units by mouth daily.  . [DISCONTINUED] Cholecalciferol (VITAMIN D) 400 UNITS capsule Take 400 Units by mouth daily.      Objective: Blood pressure 130/70, pulse 72, temperature 98.9 F (37.2 C), temperature source Oral, resp. rate 18, height 5' 5"  (1.651 m), weight 233 lb 12.8 oz (106.051 kg). Patient is alert and in no acute distress. Asterixis absent. Conjunctiva is pink. Sclera is nonicteric Oropharyngeal mucosa is normal. No neck masses or thyromegaly noted. Cardiac exam with regular rhythm normal S1 and S2. Faint systolic ejection murmur noted at upper sternal border. Lungs are clear to auscultation. Abdomen is full. She has insulin pump with butterfly needle and through skin and right midabdomen. Liver or spleen not palpable. Minimal pitting edema noted to both legs slightly more on the left side..  Labs/studies Results: H&H was 12.27.5 on 07/22/2014.    Assessment:  #1. Cirrhosis secondary to NAFLD complicated by esophageal variceal bleed and she has required periodic banding. She is due for [facility] screening. #2. GERD. Heartburn well controlled with therapy. #3. History of iron deficiency anemia. H&H 9 weeks ago was normal. #4. Chronic thrombocytopenia. Accurate platelet count difficult to obtain because of clumping.   Plan:  Patient reminded once again  that she must walk every day. Patient will benefit from wearing pedometer so that she can monitor her physical activity accurately. Upper abdominal ultrasound in 1 month. CBC, comprehensive chemistry panel and alpha-fetoprotein in 1 month. EGD and possible banding in April 2016. Office visit in 6 months.

## 2014-09-28 NOTE — Patient Instructions (Signed)
Blood work and ultrasound in one month  Esophagogastroduodenoscopy to be scheduled in April 2016. Office visit in 6 months.

## 2014-10-07 ENCOUNTER — Encounter (INDEPENDENT_AMBULATORY_CARE_PROVIDER_SITE_OTHER): Payer: Self-pay | Admitting: *Deleted

## 2014-10-07 ENCOUNTER — Other Ambulatory Visit (INDEPENDENT_AMBULATORY_CARE_PROVIDER_SITE_OTHER): Payer: Self-pay | Admitting: *Deleted

## 2014-10-07 DIAGNOSIS — R609 Edema, unspecified: Secondary | ICD-10-CM

## 2014-10-07 DIAGNOSIS — D696 Thrombocytopenia, unspecified: Secondary | ICD-10-CM

## 2014-10-07 DIAGNOSIS — K7460 Unspecified cirrhosis of liver: Secondary | ICD-10-CM

## 2014-10-22 ENCOUNTER — Ambulatory Visit (HOSPITAL_COMMUNITY)
Admission: RE | Admit: 2014-10-22 | Discharge: 2014-10-22 | Disposition: A | Discharge status: Home / Self Care | Payer: Medicare Other | Source: Ambulatory Visit | Attending: Internal Medicine | Admitting: Internal Medicine

## 2014-10-22 DIAGNOSIS — K922 Gastrointestinal hemorrhage, unspecified: Secondary | ICD-10-CM | POA: Insufficient documentation

## 2014-10-22 DIAGNOSIS — K7460 Unspecified cirrhosis of liver: Secondary | ICD-10-CM | POA: Diagnosis not present

## 2014-10-22 LAB — CBC
HCT: 40.1 % (ref 36.0–46.0)
Hemoglobin: 13.1 g/dL (ref 12.0–15.0)
MCH: 26.4 pg (ref 26.0–34.0)
MCHC: 32.7 g/dL (ref 30.0–36.0)
MCV: 80.7 fL (ref 78.0–100.0)
RBC: 4.97 MIL/uL (ref 3.87–5.11)
RDW: 16.5 % — ABNORMAL HIGH (ref 11.5–15.5)
WBC: 5.2 10*3/uL (ref 4.0–10.5)

## 2014-10-22 LAB — COMPREHENSIVE METABOLIC PANEL
ALT: 26 U/L (ref 0–35)
AST: 35 U/L (ref 0–37)
Albumin: 3.5 g/dL (ref 3.5–5.2)
Alkaline Phosphatase: 85 U/L (ref 39–117)
BUN: 10 mg/dL (ref 6–23)
CO2: 31 mEq/L (ref 19–32)
Calcium: 9.4 mg/dL (ref 8.4–10.5)
Chloride: 105 mEq/L (ref 96–112)
Creat: 0.69 mg/dL (ref 0.50–1.10)
Glucose, Bld: 138 mg/dL — ABNORMAL HIGH (ref 70–99)
Potassium: 3.7 mEq/L (ref 3.5–5.3)
Sodium: 140 mEq/L (ref 135–145)
Total Bilirubin: 1.4 mg/dL — ABNORMAL HIGH (ref 0.2–1.2)
Total Protein: 7.2 g/dL (ref 6.0–8.3)

## 2014-10-23 LAB — AFP TUMOR MARKER: AFP-Tumor Marker: 5 ng/mL (ref <6.1)

## 2014-12-14 ENCOUNTER — Encounter (INDEPENDENT_AMBULATORY_CARE_PROVIDER_SITE_OTHER): Payer: Self-pay | Admitting: *Deleted

## 2014-12-17 ENCOUNTER — Encounter (INDEPENDENT_AMBULATORY_CARE_PROVIDER_SITE_OTHER): Payer: Self-pay | Admitting: *Deleted

## 2014-12-17 ENCOUNTER — Telehealth (INDEPENDENT_AMBULATORY_CARE_PROVIDER_SITE_OTHER): Payer: Self-pay | Admitting: *Deleted

## 2014-12-17 ENCOUNTER — Other Ambulatory Visit (INDEPENDENT_AMBULATORY_CARE_PROVIDER_SITE_OTHER): Payer: Self-pay | Admitting: *Deleted

## 2014-12-17 DIAGNOSIS — I8500 Esophageal varices without bleeding: Secondary | ICD-10-CM

## 2014-12-17 DIAGNOSIS — K7469 Other cirrhosis of liver: Secondary | ICD-10-CM

## 2014-12-17 NOTE — Telephone Encounter (Signed)
Referring MD/PCP: vyas   Procedure: egd  Reason/Indication:  Cirrhosis, esophageal varices  Has patient had this procedure before?  Yes, 06/2014  If so, when, by whom and where?    Is there a family history of colon cancer?    Who?  What age when diagnosed?    Is patient diabetic?   no      Does patient have prosthetic heart valve?  no  Do you have a pacemaker?  no  Has patient ever had endocarditis? no  Has patient had joint replacement within last 12 months?  no  Does patient tend to be constipated or take laxatives?   Is patient on Coumadin, Plavix and/or Aspirin? no  Medications: see epic  Allergies: nkda  Medication Adjustment:   Procedure date & time: 01/15/15 at 730

## 2014-12-17 NOTE — Telephone Encounter (Signed)
agree

## 2015-01-15 ENCOUNTER — Encounter (HOSPITAL_COMMUNITY): Payer: Self-pay | Admitting: *Deleted

## 2015-01-15 ENCOUNTER — Encounter (HOSPITAL_COMMUNITY)
Admission: RE | Disposition: A | Discharge status: Home / Self Care | Payer: Self-pay | Source: Ambulatory Visit | Attending: Internal Medicine

## 2015-01-15 ENCOUNTER — Ambulatory Visit (HOSPITAL_COMMUNITY)
Admission: RE | Admit: 2015-01-15 | Discharge: 2015-01-15 | Disposition: A | Discharge status: Home / Self Care | Payer: Medicare Other | Source: Ambulatory Visit | Attending: Internal Medicine | Admitting: Internal Medicine

## 2015-01-15 DIAGNOSIS — M1990 Unspecified osteoarthritis, unspecified site: Secondary | ICD-10-CM | POA: Diagnosis not present

## 2015-01-15 DIAGNOSIS — M797 Fibromyalgia: Secondary | ICD-10-CM | POA: Insufficient documentation

## 2015-01-15 DIAGNOSIS — K760 Fatty (change of) liver, not elsewhere classified: Secondary | ICD-10-CM | POA: Diagnosis not present

## 2015-01-15 DIAGNOSIS — I10 Essential (primary) hypertension: Secondary | ICD-10-CM | POA: Diagnosis not present

## 2015-01-15 DIAGNOSIS — D649 Anemia, unspecified: Secondary | ICD-10-CM | POA: Diagnosis not present

## 2015-01-15 DIAGNOSIS — E039 Hypothyroidism, unspecified: Secondary | ICD-10-CM | POA: Diagnosis not present

## 2015-01-15 DIAGNOSIS — E119 Type 2 diabetes mellitus without complications: Secondary | ICD-10-CM | POA: Diagnosis not present

## 2015-01-15 DIAGNOSIS — Z79899 Other long term (current) drug therapy: Secondary | ICD-10-CM | POA: Insufficient documentation

## 2015-01-15 DIAGNOSIS — K7460 Unspecified cirrhosis of liver: Secondary | ICD-10-CM | POA: Diagnosis not present

## 2015-01-15 DIAGNOSIS — R079 Chest pain, unspecified: Secondary | ICD-10-CM | POA: Diagnosis not present

## 2015-01-15 DIAGNOSIS — M810 Age-related osteoporosis without current pathological fracture: Secondary | ICD-10-CM | POA: Insufficient documentation

## 2015-01-15 DIAGNOSIS — Z794 Long term (current) use of insulin: Secondary | ICD-10-CM | POA: Insufficient documentation

## 2015-01-15 DIAGNOSIS — Z87440 Personal history of urinary (tract) infections: Secondary | ICD-10-CM | POA: Insufficient documentation

## 2015-01-15 DIAGNOSIS — I8501 Esophageal varices with bleeding: Secondary | ICD-10-CM | POA: Insufficient documentation

## 2015-01-15 DIAGNOSIS — K3189 Other diseases of stomach and duodenum: Secondary | ICD-10-CM | POA: Insufficient documentation

## 2015-01-15 DIAGNOSIS — K7469 Other cirrhosis of liver: Secondary | ICD-10-CM | POA: Diagnosis not present

## 2015-01-15 DIAGNOSIS — I8500 Esophageal varices without bleeding: Secondary | ICD-10-CM | POA: Diagnosis not present

## 2015-01-15 DIAGNOSIS — K766 Portal hypertension: Secondary | ICD-10-CM | POA: Diagnosis not present

## 2015-01-15 HISTORY — PX: ESOPHAGOGASTRODUODENOSCOPY: SHX5428

## 2015-01-15 LAB — GLUCOSE, CAPILLARY
Glucose-Capillary: 82 mg/dL (ref 70–99)
Glucose-Capillary: 95 mg/dL (ref 70–99)
Glucose-Capillary: 97 mg/dL (ref 70–99)

## 2015-01-15 SURGERY — EGD (ESOPHAGOGASTRODUODENOSCOPY)
Anesthesia: Moderate Sedation

## 2015-01-15 MED ORDER — DEXTROSE IN LACTATED RINGERS 5 % IV SOLN
INTRAVENOUS | Status: DC
  Administered 2015-01-15: 13:00:00 via INTRAVENOUS

## 2015-01-15 MED ORDER — MEPERIDINE HCL 50 MG/ML IJ SOLN
INTRAMUSCULAR | Status: DC | PRN
  Administered 2015-01-15 (×2): 25 mg via INTRAVENOUS

## 2015-01-15 MED ORDER — MEPERIDINE HCL 50 MG/ML IJ SOLN
INTRAMUSCULAR | Status: AC
  Filled 2015-01-15: qty 1

## 2015-01-15 MED ORDER — BUTAMBEN-TETRACAINE-BENZOCAINE 2-2-14 % EX AERO
INHALATION_SPRAY | CUTANEOUS | Status: DC | PRN
  Administered 2015-01-15: 2 via TOPICAL

## 2015-01-15 MED ORDER — SODIUM CHLORIDE 0.9 % IV SOLN
INTRAVENOUS | Status: DC
  Administered 2015-01-15: 13:00:00 via INTRAVENOUS

## 2015-01-15 MED ORDER — STERILE WATER FOR IRRIGATION IR SOLN
Status: DC | PRN
  Administered 2015-01-15: 14:00:00

## 2015-01-15 MED ORDER — MIDAZOLAM HCL 5 MG/5ML IJ SOLN
INTRAMUSCULAR | Status: AC
  Filled 2015-01-15: qty 10

## 2015-01-15 MED ORDER — MIDAZOLAM HCL 5 MG/5ML IJ SOLN
INTRAMUSCULAR | Status: DC | PRN
  Administered 2015-01-15 (×3): 2 mg via INTRAVENOUS
  Administered 2015-01-15: 1 mg via INTRAVENOUS
  Administered 2015-01-15: 3 mg via INTRAVENOUS

## 2015-01-15 NOTE — Op Note (Signed)
EGD PROCEDURE REPORT  PATIENT:  Maria [name]  MR#:  [id number] Birthdate:  04-23-1956, 59 y.o., female Endoscopist:  Dr. [name], MD Referred By:  Dr. Glenda Chroman, MD Procedure Date: 01/15/2015  Procedure:   EGD  Indications:  Patient is 59 year old Caucasian female was cirrhosis complicated by esophageal variceal bleed who is undergoing EGD to treat recurrent varices. Last exam was in October 2015 and varices were small and did not require banding. She has not bled no 2 years.             Informed Consent:  The risks, benefits, alternatives & imponderables which include, but are not limited to, bleeding, infection, perforation, drug reaction and potential missed lesion have been reviewed.  The potential for biopsy, lesion removal, esophageal dilation, etc. have also been discussed.  Questions have been answered.  All parties agreeable.  Please see history & physical in medical record for more information.  Medications:  Demerol 50 mg IV Versed 10 mg IV Cetacaine spray topically for oropharyngeal anesthesia  Description of procedure:  The endoscope was introduced through the mouth and advanced to the second portion of the duodenum without difficulty or limitations. The mucosal surfaces were surveyed very carefully during advancement of the scope and upon withdrawal.  Findings:  Esophagus:  Mucosa of the proximal and middle segment was normal. Scarring noted at distal esophagus from previous banding along with short columns of varices no more than grade 1. GEJ:  36 cm Stomach:  Stomach was empty and distended very well with insufflation. Folds in the proximal stomach were normal. Examination mucosa at gastric body revealed snake skinning her most a pattern. Antral mucosa revealed nodularity and multiple telangiectasia without stigmata of bleed. No fundal varices identified. Mucosa at angularis was unremarkable. Duodenum:  Normal bulbar and post bulbar  mucosa.  Therapeutic/Diagnostic Maneuvers Performed:  None  Complications:  None  Impression: Two short columns of grade 1 esophageal varices not large enough to be banded, Esophageal scarring from previous banding sessions. Portal gastropathy.  Recommendations:  Standard instructions given. Patient advised to make an appointment to see Dr. Woody Seller regarding intermittent chest pain. Will provide patient with copy of ECG. Office visit in July or August 2016.   Maria [name]  01/15/2015  2:51 PM  CC: Dr. Glenda Chroman., MD & Dr. Rayne Du ref. provider found

## 2015-01-15 NOTE — Discharge Instructions (Signed)
Resume usual medications and diet. Please arrange for office visit with Dr. Woody Seller for further evaluation of chest pain. No driving for 24 hours. Office visit as planned.  Gastrointestinal Endoscopy, Care After Refer to this sheet in the next few weeks. These instructions provide you with information on caring for yourself after your procedure. Your caregiver may also give you more specific instructions. Your treatment has been planned according to current medical practices, but problems sometimes occur. Call your caregiver if you have any problems or questions after your procedure. HOME CARE INSTRUCTIONS  If you were given medicine to help you relax (sedative), do not drive, operate machinery, or sign important documents for 24 hours.  Avoid alcohol and hot or warm beverages for the first 24 hours after the procedure.  Only take over-the-counter or prescription medicines for pain, discomfort, or fever as directed by your caregiver. You may resume taking your normal medicines unless your caregiver tells you otherwise. Ask your caregiver when you may resume taking medicines that may cause bleeding, such as aspirin, clopidogrel, or warfarin.  You may return to your normal diet and activities on the day after your procedure, or as directed by your caregiver. Walking may help to reduce any bloated feeling in your abdomen.  Drink enough fluids to keep your urine clear or pale yellow.  You may gargle with salt water if you have a sore throat. SEEK IMMEDIATE MEDICAL CARE IF:  You have severe nausea or vomiting.  You have severe abdominal pain, abdominal cramps that last longer than 6 hours, or abdominal swelling (distention).  You have severe shoulder or back pain.  You have trouble swallowing.  You have shortness of breath, your breathing is shallow, or you are breathing faster than normal.  You have a fever or a rapid heartbeat.  You vomit blood or material that looks like coffee  grounds.  You have bloody, black, or tarry stools. MAKE SURE YOU:  Understand these instructions.  Will watch your condition.  Will get help right away if you are not doing well or get worse. Document Released: 04/18/2004 Document Revised: 01/19/2014 Document Reviewed: 12/05/2011 [facility] Patient Information 2015 [place], [place]. This information is not intended to replace advice given to you by your health care provider. Make sure you discuss any questions you have with your health care provider.

## 2015-01-15 NOTE — H&P (Signed)
Maria [name] is an 59 y.o. female.   Chief Complaint: Patient is here for EGD and possible esophageal variceal banding. HPI: Patient is 59 year old Caucasian female was history of cirrhosis secondary to NAFLD complicated by esophageal variceal bleed in November 2011 and again in November 2013 who is here for EGD to treat recurrent varices. Last exam was in October 2015 and she did not require banding. Patient complains of intermittent left precordial pain which started yesterday. She denies shortness of breath or palpitation. Pain is not gone worsen physical activity neck she has got better. Her cardiac history is negative. She had normal echocardiography by PCP within the last few years. Patient states heartburn is well controlled. She denies dysphagia nausea vomiting melena or rectal bleeding. EKG was obtained and does not show acute changes and is unchanged from EKG of January 2014.   Past Medical History  Diagnosis Date  . Cirrhosis   . NAFLD (nonalcoholic fatty liver disease)   . Esophageal bleed, non-variceal   . Hypertension   . Hypothyroidism   . Fibromyalgia   . Osteoarthrosis   . Diabetes mellitus   . UTI (urinary tract infection) 11/12 end of the month    Patient feels that she passed a kidney stone at that time  . PONV (postoperative nausea and vomiting)   . Anemia   . Eye hemorrhage     Behind left eye  . Osteoporosis     Past Surgical History  Procedure Laterality Date  . Esophagogastroduodenoscopy w/ banding  08/2010  . Appendectomy  1980  . Tonsillectomy    . Bilateral salpingoophorectomy    . Vaginal hysterectomy    . Colonoscopy  03/15/2011  . Upper gastrointestinal endoscopy  03/15/2011    EGD ED BANDING/TCS  . Upper gastrointestinal endoscopy  09/05/2010  . Upper gastrointestinal endoscopy  08/11/2010  . Esophagogastroduodenoscopy  04/24/2012    Procedure: ESOPHAGOGASTRODUODENOSCOPY (EGD);  Surgeon: [name], MD;  [facility];   Service: Endoscopy;  Laterality: N/A;  300  . Esophageal banding  04/24/2012    Procedure: ESOPHAGEAL BANDING;  Surgeon: [name], MD;  [facility];  Service: Endoscopy;  Laterality: N/A;  . Esophageal banding  08/03/2012    [facility] in [place] , [place]  . Esophagogastroduodenoscopy (egd) with propofol  09/24/2012    Procedure: ESOPHAGOGASTRODUODENOSCOPY (EGD) WITH PROPOFOL;  Surgeon: [name], MD;  Location: [facility];  Service: Endoscopy;  Laterality: N/A;  GE junction at 36  . Esophageal banding  09/24/2012    Procedure: ESOPHAGEAL BANDING;  Surgeon: [name], MD;  Location: [facility];  Service: Endoscopy;  Laterality: N/A;  Banding x 3  . Esophagogastroduodenoscopy N/A 01/29/2013    Procedure: ESOPHAGOGASTRODUODENOSCOPY (EGD);  Surgeon: [name], MD;  [facility];  Service: Endoscopy;  Laterality: N/A;  1200  . Esophageal banding N/A 01/29/2013    Procedure: ESOPHAGEAL BANDING;  Surgeon: [name], MD;  [facility];  Service: Endoscopy;  Laterality: N/A;  . Cholecystectomy    . Esophagogastroduodenoscopy N/A 05/22/2013    Procedure: ESOPHAGOGASTRODUODENOSCOPY (EGD);  Surgeon: [name], MD;  [facility];  Service: Endoscopy;  Laterality: N/A;  1:55  . Esophagogastroduodenoscopy N/A 12/31/2013    Procedure: ESOPHAGOGASTRODUODENOSCOPY (EGD);  Surgeon: [name], MD;  [facility];  Service: Endoscopy;  Laterality: N/A;  1200  . Esophagogastroduodenoscopy N/A 06/19/2014    Procedure: ESOPHAGOGASTRODUODENOSCOPY (EGD);  Surgeon: Bernadene Person  Gloriann Loan, MD;  [facility];  Service: Endoscopy;  Laterality: N/A;  1055  . Esophageal banding N/A 06/19/2014    Procedure: ESOPHAGEAL BANDING;  Surgeon: [name], MD;  [facility];  Service: Endoscopy;  Laterality: N/A;    Family History  Problem Relation Age of Onset  . Lung cancer Mother   . Diabetes Father   . Diabetes Sister    . Hypertension Sister   . Hypothyroidism Sister   . Colon cancer Brother   . Diabetes Sister   . Hypothyroidism Brother   . Healthy Daughter   . Obesity Daughter   . Hypertension Daughter    Social History:  reports that she has never smoked. She has never used smokeless tobacco. She reports that she does not drink alcohol or use illicit drugs.  Allergies: No Known Allergies  Medications Prior to Admission  Medication Sig Dispense Refill  . Calcium Carb-Cholecalciferol (OS-CAL CALCIUM + D3) 500-200 MG-UNIT TABS Take 1 tablet by mouth daily.     . ferrous sulfate 325 (65 FE) MG tablet Take 325 mg by mouth daily with breakfast.    . furosemide (LASIX) 20 MG tablet Take 1 tablet (20 mg total) by mouth daily as needed. (Patient taking differently: Take 20 mg by mouth daily as needed for fluid. ) 90 tablet 3  . insulin lispro (HUMALOG) 100 UNIT/ML injection Inject into the skin 3 (three) times daily before meals. Rate of 5 units/hour except smaller dose at night per patient. Patient may bolus as needed.    Marland Kitchen levothyroxine (SYNTHROID, LEVOTHROID) 25 MCG tablet Take 25 mcg by mouth daily.      . Magnesium 400 MG CAPS Take 400 mg by mouth daily.     Marland Kitchen MILK THISTLE PO Take 450 mg by mouth daily.     . pantoprazole (PROTONIX) 40 MG tablet TAKE 1 BY MOUTH DAILY 90 tablet 3  . PARoxetine (PAXIL) 40 MG tablet Take 40 mg by mouth every morning.    . potassium chloride SA (K-DUR,KLOR-CON) 20 MEQ tablet Take 1 tablet (20 mEq total) by mouth daily as needed. Takes when she takes lasix 90 tablet 3  . propranolol (INDERAL) 20 MG tablet Take 1 tablet (20 mg total) by mouth 2 (two) times daily. (Patient taking differently: Take 20 mg by mouth daily. ) 180 tablet 3  . vitamin C (ASCORBIC ACID) 500 MG tablet Take 500 mg by mouth daily.    . vitamin E 400 UNIT capsule Take 400 Units by mouth daily.      Results for orders placed or performed during the hospital encounter of 01/15/15 (from the past 48  hour(s))  Glucose, capillary     Status: None   Collection Time: 01/15/15  1:21 PM  Result Value Ref Range   Glucose-Capillary 82 70 - 99 mg/dL  Glucose, capillary     Status: None   Collection Time: 01/15/15  2:03 PM  Result Value Ref Range   Glucose-Capillary 95 70 - 99 mg/dL   No results found.  ROS  Blood pressure 146/59, pulse 72, temperature 98.1 F (36.7 C), temperature source Oral, resp. rate 18, height 5' 4"  (1.626 m), weight 230 lb (104.327 kg), SpO2 96 %. Physical Exam  Constitutional: She appears well-developed and well-nourished.  HENT:  Mouth/Throat: Oropharynx is clear and moist.  Eyes: Conjunctivae are normal. No scleral icterus.  Neck: No thyromegaly present.  Cardiovascular: Normal rate, regular rhythm and normal heart sounds.   No  murmur heard. Respiratory: Effort normal and breath sounds normal.  No chest wall tenderness noted  GI: Soft. She exhibits no distension and no mass. There is no tenderness.  Musculoskeletal: She exhibits no edema.  Lymphadenopathy:    She has no cervical adenopathy.  Neurological: She is alert.  Skin: Skin is warm and dry.     Assessment/Plan Cirrhosis secondary to NAFLD. History of esophageal variceal bleed. Noncardiac chest pain possibly pleuritic. EGD and possible esophageal variceal banding.  REHMAN,NAJEEB U 01/15/2015, 2:17 PM

## 2015-01-15 NOTE — Progress Notes (Signed)
Patient complaining of chest pain for 24 hours. Stated it feels more like gas than chest pain. BP and heart rate stable. NSR on monitor. Patients blood sugar 82 at this time and patient is on insulin pump. [name] notified.  EKG done and D5LR started as per physician verbal order.

## 2015-01-18 ENCOUNTER — Encounter (HOSPITAL_COMMUNITY): Payer: Self-pay | Admitting: Internal Medicine

## 2015-02-02 ENCOUNTER — Encounter (INDEPENDENT_AMBULATORY_CARE_PROVIDER_SITE_OTHER): Payer: Self-pay | Admitting: *Deleted

## 2015-03-15 ENCOUNTER — Other Ambulatory Visit: Payer: Self-pay

## 2015-04-08 ENCOUNTER — Encounter (INDEPENDENT_AMBULATORY_CARE_PROVIDER_SITE_OTHER): Payer: Self-pay | Admitting: *Deleted

## 2015-05-11 ENCOUNTER — Encounter (INDEPENDENT_AMBULATORY_CARE_PROVIDER_SITE_OTHER): Payer: Self-pay | Admitting: *Deleted

## 2015-05-11 ENCOUNTER — Ambulatory Visit (INDEPENDENT_AMBULATORY_CARE_PROVIDER_SITE_OTHER): Payer: Medicare Other | Admitting: Internal Medicine

## 2015-05-11 ENCOUNTER — Telehealth (INDEPENDENT_AMBULATORY_CARE_PROVIDER_SITE_OTHER): Payer: Self-pay | Admitting: Internal Medicine

## 2015-05-11 ENCOUNTER — Encounter (INDEPENDENT_AMBULATORY_CARE_PROVIDER_SITE_OTHER): Payer: Self-pay | Admitting: Internal Medicine

## 2015-05-11 VITALS — BP 132/72 | HR 66 | Temp 98.0°F | Ht 64.0 in | Wt 237.4 lb

## 2015-05-11 DIAGNOSIS — K760 Fatty (change of) liver, not elsewhere classified: Secondary | ICD-10-CM | POA: Diagnosis not present

## 2015-05-11 NOTE — Telephone Encounter (Signed)
[name], when will her  next  EGD/possible banding be due. She says you look every 6 months?. She is going out of town November 5-12.  Thanks, Karna Christmas

## 2015-05-11 NOTE — Patient Instructions (Signed)
OV in 6 months.

## 2015-05-11 NOTE — Progress Notes (Signed)
Subjective:    Patient ID: Maria [name], female    DOB: Mar 12, 1956, 59 y.o.   MRN: [id number]  HPIHere for scheduled visit. Hx of cirrhosis secondary to NAFLD complicated by 2 episodes of esophageal variceal bleed. First episode occurred in November 2011 and the second episode occurred in November 2013.  She has been banded periodically.  Last banding session was in 2014. Her last EGD for possible banding was in April of this year. She had two short columns of grade 1 esophageal varices not large enough to band. She tells me she is doing okay. Appetite is good. No weight loss.  No abdominal pain. She usually has a BM once a day and sometimes more.  No melena or BRRB.  She is walking daily She is going out of town in November 5-12.    04/13/2015 H and H 12.7 37,7, MCV 78. Total bili 1.5, ALP 93, AST  36, ALT 25, Vit D 24.8 (Platelet ct not sent)    01/15/2015 EGD  Indications: Patient is 59 year old Caucasian female was cirrhosis complicated by esophageal variceal bleed who is undergoing EGD to treat recurrent varices. Last exam was in October 2015 and varices were small and did not require banding. She has not bled no 2 years.   Impression: Two short columns of grade 1 esophageal varices not large enough to be banded, Esophageal scarring from previous banding sessions. Portal gastropathy.  Review of Systems Past Medical History  Diagnosis Date  . Cirrhosis   . NAFLD (nonalcoholic fatty liver disease)   . Esophageal bleed, non-variceal   . Hypertension   . Hypothyroidism   . Fibromyalgia   . Osteoarthrosis   . Diabetes mellitus   . UTI (urinary tract infection) 11/12 end of the month    Patient feels that she passed a kidney stone at that time  . PONV (postoperative nausea and vomiting)   . Anemia   . Eye hemorrhage     Behind left eye  . Osteoporosis      Past Surgical History  Procedure Laterality Date  . Esophagogastroduodenoscopy w/ banding  08/2010  . Appendectomy  1980  . Tonsillectomy    . Bilateral salpingoophorectomy    . Vaginal hysterectomy    . Colonoscopy  03/15/2011  . Upper gastrointestinal endoscopy  03/15/2011    EGD ED BANDING/TCS  . Upper gastrointestinal endoscopy  09/05/2010  . Upper gastrointestinal endoscopy  08/11/2010  . Esophagogastroduodenoscopy  04/24/2012    Procedure: ESOPHAGOGASTRODUODENOSCOPY (EGD);  Surgeon: [name], MD;  [facility];  Service: Endoscopy;  Laterality: N/A;  300  . Esophageal banding  04/24/2012    Procedure: ESOPHAGEAL BANDING;  Surgeon: [name], MD;  [facility];  Service: Endoscopy;  Laterality: N/A;  . Esophageal banding  08/03/2012    [facility] in [place] , [place]  . Esophagogastroduodenoscopy (egd) with propofol  09/24/2012    Procedure: ESOPHAGOGASTRODUODENOSCOPY (EGD) WITH PROPOFOL;  Surgeon: [name], MD;  Location: [facility];  Service: Endoscopy;  Laterality: N/A;  GE junction at 36  . Esophageal banding  09/24/2012    Procedure: ESOPHAGEAL BANDING;  Surgeon: [name], MD;  Location: [facility];  Service: Endoscopy;  Laterality: N/A;  Banding x 3  . Esophagogastroduodenoscopy N/A 01/29/2013    Procedure: ESOPHAGOGASTRODUODENOSCOPY (EGD);  Surgeon: [name], MD;  [facility];  Service: Endoscopy;  Laterality: N/A;  1200  . Esophageal banding N/A 01/29/2013  Procedure: ESOPHAGEAL BANDING;  Surgeon: [name], MD;  [facility];  Service: Endoscopy;  Laterality: N/A;  . Cholecystectomy    . Esophagogastroduodenoscopy N/A 05/22/2013    Procedure: ESOPHAGOGASTRODUODENOSCOPY (EGD);  Surgeon: [name], MD;  [facility];  Service: Endoscopy;  Laterality: N/A;  1:55  . Esophagogastroduodenoscopy N/A 12/31/2013    Procedure: ESOPHAGOGASTRODUODENOSCOPY (EGD);  Surgeon: [name], MD;  [facility];  Service: Endoscopy;  Laterality: N/A;  1200  . Esophagogastroduodenoscopy N/A 06/19/2014    Procedure: ESOPHAGOGASTRODUODENOSCOPY (EGD);  Surgeon: [name], MD;  [facility];  Service: Endoscopy;  Laterality: N/A;  1055  . Esophageal banding N/A 06/19/2014    Procedure: ESOPHAGEAL BANDING;  Surgeon: [name], MD;  [facility];  Service: Endoscopy;  Laterality: N/A;  . Esophagogastroduodenoscopy N/A 01/15/2015    Procedure: ESOPHAGOGASTRODUODENOSCOPY (EGD);  Surgeon: [name], MD;  [facility];  Service: Endoscopy;  Laterality: N/A;  730 - moved to 9:45 - moved to 1250-Ann notified pt    No Known Allergies  Current Outpatient Prescriptions on File Prior to Visit  Medication Sig Dispense Refill  . ferrous sulfate 325 (65 FE) MG tablet Take 325 mg by mouth daily with breakfast.    . furosemide (LASIX) 20 MG tablet Take 1 tablet (20 mg total) by mouth daily as needed. (Patient taking differently: Take 20 mg by mouth daily as needed for fluid. ) 90 tablet 3  . insulin lispro (HUMALOG) 100 UNIT/ML injection Inject into the skin 3 (three) times daily before meals. Rate of 5 units/hour except smaller dose at night per patient. Patient may bolus as needed.    Marland Kitchen levothyroxine (SYNTHROID, LEVOTHROID) 25 MCG tablet Take 25 mcg by mouth daily.      Marland Kitchen MILK THISTLE PO Take 450 mg by mouth daily.     . pantoprazole (PROTONIX) 40 MG tablet TAKE 1 BY MOUTH DAILY 90 tablet 3  . PARoxetine (PAXIL) 40 MG tablet Take 40 mg by mouth every morning.    . potassium chloride SA (K-DUR,KLOR-CON) 20 MEQ tablet Take 1 tablet (20 mEq total) by mouth daily as needed. Takes when she takes lasix 90 tablet 3  . propranolol (INDERAL) 20 MG tablet Take 1 tablet (20 mg total) by mouth 2 (two) times daily. (Patient taking differently: Take 20 mg by mouth daily. ) 180 tablet 3  . vitamin C (ASCORBIC ACID) 500 MG tablet Take 500 mg by mouth  daily.    . vitamin E 400 UNIT capsule Take 400 Units by mouth daily.    . Calcium Carb-Cholecalciferol (OS-CAL CALCIUM + D3) 500-200 MG-UNIT TABS Take 1 tablet by mouth daily.     . Magnesium 400 MG CAPS Take 400 mg by mouth daily.      No current facility-administered medications on file prior to visit.        Objective:   Physical Exam Blood pressure 132/72, pulse 66, temperature 98 F (36.7 C), height 5' 4"  (1.626 m), weight 237 lb 6.4 oz (107.684 kg).  Alert and oriented. Skin warm and dry. Oral mucosa is moist.   . Sclera anicteric, conjunctivae is pink. Thyroid not enlarged. No cervical lymphadenopathy. Lungs clear. Heart regular rate and rhythm Loud murmur heard. .  Abdomen is soft. Bowel sounds are positive. No hepatomegaly. No abdominal masses felt.  No tenderness.  No edema to lower extremities.         Assessment &  Plan:  Nonalcoholic cirrhosis. Hx of esophageal variceal bleed. Her last EGD was in April of 2016.  Needs [place] RUQ for surveillance.  I will send Dr. Laural Golden a message concerning scheduling of next EGD

## 2015-05-12 ENCOUNTER — Encounter (INDEPENDENT_AMBULATORY_CARE_PROVIDER_SITE_OTHER): Payer: Self-pay | Admitting: *Deleted

## 2015-05-12 ENCOUNTER — Other Ambulatory Visit (INDEPENDENT_AMBULATORY_CARE_PROVIDER_SITE_OTHER): Payer: Self-pay | Admitting: *Deleted

## 2015-05-12 DIAGNOSIS — I8500 Esophageal varices without bleeding: Secondary | ICD-10-CM

## 2015-05-12 DIAGNOSIS — K7469 Other cirrhosis of liver: Secondary | ICD-10-CM

## 2015-05-12 NOTE — Telephone Encounter (Signed)
EGD poss banding sch'd 06/30/15 at 1200, patient aware

## 2015-05-12 NOTE — Telephone Encounter (Signed)
Patient was called and made aware. Forwarded to [employer].

## 2015-05-12 NOTE — Telephone Encounter (Signed)
Next EGD will be due and of October 2016. Please let patient know

## 2015-05-17 ENCOUNTER — Ambulatory Visit (INDEPENDENT_AMBULATORY_CARE_PROVIDER_SITE_OTHER): Payer: Medicare Other | Admitting: Internal Medicine

## 2015-05-17 ENCOUNTER — Ambulatory Visit (HOSPITAL_COMMUNITY)
Admission: RE | Admit: 2015-05-17 | Discharge: 2015-05-17 | Disposition: A | Discharge status: Home / Self Care | Payer: Medicare Other | Source: Ambulatory Visit | Attending: Internal Medicine | Admitting: Internal Medicine

## 2015-05-17 DIAGNOSIS — K760 Fatty (change of) liver, not elsewhere classified: Secondary | ICD-10-CM

## 2015-05-17 DIAGNOSIS — K7460 Unspecified cirrhosis of liver: Secondary | ICD-10-CM | POA: Insufficient documentation

## 2015-05-31 ENCOUNTER — Encounter (INDEPENDENT_AMBULATORY_CARE_PROVIDER_SITE_OTHER): Payer: Self-pay

## 2015-06-30 ENCOUNTER — Encounter (HOSPITAL_COMMUNITY)
Admission: RE | Disposition: A | Discharge status: Home / Self Care | Payer: Self-pay | Source: Ambulatory Visit | Attending: Internal Medicine

## 2015-06-30 ENCOUNTER — Encounter (HOSPITAL_COMMUNITY): Payer: Self-pay | Admitting: *Deleted

## 2015-06-30 ENCOUNTER — Ambulatory Visit (HOSPITAL_COMMUNITY)
Admission: RE | Admit: 2015-06-30 | Discharge: 2015-06-30 | Disposition: A | Discharge status: Home / Self Care | Payer: Medicare Other | Source: Ambulatory Visit | Attending: Internal Medicine | Admitting: Internal Medicine

## 2015-06-30 DIAGNOSIS — K31819 Angiodysplasia of stomach and duodenum without bleeding: Secondary | ICD-10-CM | POA: Diagnosis not present

## 2015-06-30 DIAGNOSIS — I8510 Secondary esophageal varices without bleeding: Secondary | ICD-10-CM | POA: Insufficient documentation

## 2015-06-30 DIAGNOSIS — E039 Hypothyroidism, unspecified: Secondary | ICD-10-CM | POA: Diagnosis not present

## 2015-06-30 DIAGNOSIS — I10 Essential (primary) hypertension: Secondary | ICD-10-CM | POA: Insufficient documentation

## 2015-06-30 DIAGNOSIS — K760 Fatty (change of) liver, not elsewhere classified: Secondary | ICD-10-CM | POA: Diagnosis not present

## 2015-06-30 DIAGNOSIS — K3189 Other diseases of stomach and duodenum: Secondary | ICD-10-CM | POA: Insufficient documentation

## 2015-06-30 DIAGNOSIS — Z8719 Personal history of other diseases of the digestive system: Secondary | ICD-10-CM

## 2015-06-30 DIAGNOSIS — Z801 Family history of malignant neoplasm of trachea, bronchus and lung: Secondary | ICD-10-CM | POA: Diagnosis not present

## 2015-06-30 DIAGNOSIS — Z79899 Other long term (current) drug therapy: Secondary | ICD-10-CM | POA: Insufficient documentation

## 2015-06-30 DIAGNOSIS — K7460 Unspecified cirrhosis of liver: Secondary | ICD-10-CM

## 2015-06-30 DIAGNOSIS — I8500 Esophageal varices without bleeding: Secondary | ICD-10-CM | POA: Diagnosis not present

## 2015-06-30 DIAGNOSIS — Z794 Long term (current) use of insulin: Secondary | ICD-10-CM | POA: Diagnosis not present

## 2015-06-30 DIAGNOSIS — K7469 Other cirrhosis of liver: Secondary | ICD-10-CM

## 2015-06-30 DIAGNOSIS — E119 Type 2 diabetes mellitus without complications: Secondary | ICD-10-CM | POA: Insufficient documentation

## 2015-06-30 DIAGNOSIS — Z800 Family history of malignant neoplasm of digestive organs: Secondary | ICD-10-CM | POA: Diagnosis not present

## 2015-06-30 HISTORY — PX: ESOPHAGOGASTRODUODENOSCOPY: SHX5428

## 2015-06-30 LAB — GLUCOSE, CAPILLARY: Glucose-Capillary: 129 mg/dL — ABNORMAL HIGH (ref 65–99)

## 2015-06-30 SURGERY — EGD (ESOPHAGOGASTRODUODENOSCOPY)
Anesthesia: Moderate Sedation

## 2015-06-30 MED ORDER — MIDAZOLAM HCL 5 MG/5ML IJ SOLN
INTRAMUSCULAR | Status: DC | PRN
  Administered 2015-06-30 (×5): 2 mg via INTRAVENOUS

## 2015-06-30 MED ORDER — MEPERIDINE HCL 50 MG/ML IJ SOLN
INTRAMUSCULAR | Status: DC | PRN
  Administered 2015-06-30 (×2): 25 mg via INTRAVENOUS

## 2015-06-30 MED ORDER — MEPERIDINE HCL 50 MG/ML IJ SOLN
INTRAMUSCULAR | Status: AC
  Filled 2015-06-30: qty 1

## 2015-06-30 MED ORDER — SODIUM CHLORIDE 0.9 % IV SOLN
INTRAVENOUS | Status: DC
  Administered 2015-06-30: 1000 mL via INTRAVENOUS

## 2015-06-30 MED ORDER — MIDAZOLAM HCL 5 MG/5ML IJ SOLN
INTRAMUSCULAR | Status: AC
  Filled 2015-06-30: qty 10

## 2015-06-30 MED ORDER — STERILE WATER FOR IRRIGATION IR SOLN
Status: DC | PRN
  Administered 2015-06-30: 12:00:00

## 2015-06-30 NOTE — Discharge Instructions (Signed)
Resume usual medications and diet. No driving for 24 hours. Physician will call with results of blood test. Office visit in 3 months.    Esophagogastroduodenoscopy, Care After Refer to this sheet in the next few weeks. These instructions provide you with information about caring for yourself after your procedure. Your health care provider may also give you more specific instructions. Your treatment has been planned according to current medical practices, but problems sometimes occur. Call your health care provider if you have any problems or questions after your procedure. WHAT TO EXPECT AFTER THE PROCEDURE After your procedure, it is typical to feel:  Soreness in your throat.  Pain with swallowing.  Sick to your stomach (nauseous).  Bloated.  Dizzy.  Fatigued. HOME CARE INSTRUCTIONS  Do not eat or drink anything until the numbing medicine (local anesthetic) has worn off and your gag reflex has returned. You will know that the local anesthetic has worn off when you can swallow comfortably.  Do not drive or operate machinery until directed by your health care provider.  Take medicines only as directed by your health care provider. SEEK MEDICAL CARE IF:   You cannot stop coughing.  You are not urinating at all or less than usual. SEEK IMMEDIATE MEDICAL CARE IF:  You have difficulty swallowing.  You cannot eat or drink.  You have worsening throat or chest pain.  You have dizziness or lightheadedness or you faint.  You have nausea or vomiting.  You have chills.  You have a fever.  You have severe abdominal pain.  You have black, tarry, or bloody stools.   This information is not intended to replace advice given to you by your health care provider. Make sure you discuss any questions you have with your health care provider.   Document Released: 08/21/2012 Document Revised: 09/25/2014 Document Reviewed: 08/21/2012 Elsevier Interactive Patient Education [employer]

## 2015-06-30 NOTE — Op Note (Signed)
EGD PROCEDURE REPORT  PATIENT:  Maria [name]  MR#:  [id number] Birthdate:  December 22, 1955, 59 y.o., female Endoscopist:  Dr. [name], MD Referred By:  Dr. Glenda Chroman, MD Procedure Date: 06/30/2015  Procedure:   EGD  Indications:  Patient is 59 year old Caucasian female with cirrhosis secondary to non-alcoholic steatohepatitis complicated by esophageal variceal bleed(November 2011 and November 2013) who was undergone periodic esophageal variceal banding. Last EGD was in April 2016 and she did not require any banding.            Informed Consent:  The risks, benefits, alternatives & imponderables which include, but are not limited to, bleeding, infection, perforation, drug reaction and potential missed lesion have been reviewed.  The potential for biopsy, lesion removal, esophageal dilation, etc. have also been discussed.  Questions have been answered.  All parties agreeable.  Please see history & physical in medical record for more information.  Medications:  Demerol 50 mg IV Versed 10 mg IV Cetacaine spray topically for oropharyngeal anesthesia  Description of procedure:  The endoscope was introduced through the mouth and advanced to the second portion of the duodenum without difficulty or limitations. The mucosal surfaces were surveyed very carefully during advancement of the scope and upon withdrawal.  Findings:  Esophagus:  Mucosa of the proximal half was normal. Focal scarring noted to mucosa of distal esophagus along with 2 short columns of grade 1 esophageal varices. GEJ:  35 cm Stomach:  Stomach was empty and distended very well with insufflation. Folds in the proximal stomach were normal. Examination mucosa at gastric body revealed erythema and mosaic pattern. Multiple telangiectasia noted involving antral mucosa without stigmata of bleed. Pyloric channel was patent. Angularis was unremarkable. Fundus and cardia were carefully examined and no varices noted. Duodenum:   Normal bulbar and post bulbar mucosa.  Therapeutic/Diagnostic Maneuvers Performed:  None  Complications:    EBL: None  Impression: Two short columns of grade 1 esophageal varices not large enough to be banded. Focal esophageal scarring from previous banding. Portal gastropathy. Gastric antral vascular ectasia without stigmata of bleed.  Recommendations:  Standard instructions given. Will check AFP level today. Office visit in 3 months.  Sayde Lish U  06/30/2015  12:05 PM  CC: Dr. Glenda Chroman., MD & Dr. Rayne Du ref. provider found

## 2015-06-30 NOTE — H&P (Signed)
Maria [name] is an 59 y.o. female.   Chief Complaint: Issues here for EGD and possible esophageal variceal banding. HPI: Patient is 12-year-old Caucasian female who has cirrhosis secondary to NAFLD complicated by esophageal variceal bleed in November 2011 and again in November 2013. She is undergoing periodic EGDs banding as needed. Last exam was 6 months ago and shortly has small varices. He denies heartburn dysphagia melena or rectal bleeding. She says she is walking 4700 steps on most days. She says she is limited somewhat because of left hip pain and she has chest pressure and walking. She had echocardiography by Dr. Woody Seller and no abnormality noted.  Past Medical History  Diagnosis Date  . Cirrhosis ([place])   . NAFLD (nonalcoholic fatty liver disease)   . Esophageal bleed, non-variceal   . Hypertension   . Hypothyroidism   . Fibromyalgia   . Osteoarthrosis   . Diabetes mellitus   . UTI (urinary tract infection) 11/12 end of the month    Patient feels that she passed a kidney stone at that time  . PONV (postoperative nausea and vomiting)   . Anemia   . Eye hemorrhage     Behind left eye  . Osteoporosis     Past Surgical History  Procedure Laterality Date  . Esophagogastroduodenoscopy w/ banding  08/2010  . Appendectomy  1980  . Tonsillectomy    . Bilateral salpingoophorectomy    . Vaginal hysterectomy    . Colonoscopy  03/15/2011  . Upper gastrointestinal endoscopy  03/15/2011    EGD ED BANDING/TCS  . Upper gastrointestinal endoscopy  09/05/2010  . Upper gastrointestinal endoscopy  08/11/2010  . Esophagogastroduodenoscopy  04/24/2012    Procedure: ESOPHAGOGASTRODUODENOSCOPY (EGD);  Surgeon: [name], MD;  [facility];  Service: Endoscopy;  Laterality: N/A;  300  . Esophageal banding  04/24/2012    Procedure: ESOPHAGEAL BANDING;  Surgeon: [name], MD;  [facility];  Service: Endoscopy;  Laterality: N/A;  . Esophageal banding  08/03/2012     [facility] in [place] , [place]  . Esophagogastroduodenoscopy (egd) with propofol  09/24/2012    Procedure: ESOPHAGOGASTRODUODENOSCOPY (EGD) WITH PROPOFOL;  Surgeon: [name], MD;  Location: [facility];  Service: Endoscopy;  Laterality: N/A;  GE junction at 36  . Esophageal banding  09/24/2012    Procedure: ESOPHAGEAL BANDING;  Surgeon: [name], MD;  Location: [facility];  Service: Endoscopy;  Laterality: N/A;  Banding x 3  . Esophagogastroduodenoscopy N/A 01/29/2013    Procedure: ESOPHAGOGASTRODUODENOSCOPY (EGD);  Surgeon: [name], MD;  [facility];  Service: Endoscopy;  Laterality: N/A;  1200  . Esophageal banding N/A 01/29/2013    Procedure: ESOPHAGEAL BANDING;  Surgeon: [name], MD;  [facility];  Service: Endoscopy;  Laterality: N/A;  . Cholecystectomy    . Esophagogastroduodenoscopy N/A 05/22/2013    Procedure: ESOPHAGOGASTRODUODENOSCOPY (EGD);  Surgeon: [name], MD;  [facility];  Service: Endoscopy;  Laterality: N/A;  1:55  . Esophagogastroduodenoscopy N/A 12/31/2013    Procedure: ESOPHAGOGASTRODUODENOSCOPY (EGD);  Surgeon: [name], MD;  [facility];  Service: Endoscopy;  Laterality: N/A;  1200  . Esophagogastroduodenoscopy N/A 06/19/2014    Procedure: ESOPHAGOGASTRODUODENOSCOPY (EGD);  Surgeon: [name], MD;  [facility];  Service: Endoscopy;  Laterality: N/A;  1055  . Esophageal banding N/A 06/19/2014    Procedure: ESOPHAGEAL BANDING;  Surgeon: [name], MD;  [facility];  Service: Endoscopy;  Laterality: N/A;  . Esophagogastroduodenoscopy N/A 01/15/2015    Procedure: ESOPHAGOGASTRODUODENOSCOPY (EGD);  Surgeon: [name], MD;  [facility];  Service: Endoscopy;  Laterality: N/A;  730 - moved to 9:45 - moved to 1250-Ann notified pt    Family History  Problem Relation Age of Onset  . Lung cancer Mother   . Diabetes Father   . Diabetes Sister    . Hypertension Sister   . Hypothyroidism Sister   . Colon cancer Brother   . Diabetes Sister   . Hypothyroidism Brother   . Healthy Daughter   . Obesity Daughter   . Hypertension Daughter    Social History:  reports that she has never smoked. She has never used smokeless tobacco. She reports that she does not drink alcohol or use illicit drugs.  Allergies: No Known Allergies  Medications Prior to Admission  Medication Sig Dispense Refill  . cholecalciferol (VITAMIN D) 1000 UNITS tablet Take 1,000 Units by mouth daily.    . ferrous sulfate 325 (65 FE) MG tablet Take 325 mg by mouth daily with breakfast.    . furosemide (LASIX) 20 MG tablet Take 1 tablet (20 mg total) by mouth daily as needed. (Patient taking differently: Take 20 mg by mouth daily as needed for fluid. ) 90 tablet 3  . insulin lispro (HUMALOG) 100 UNIT/ML injection Inject into the skin 3 (three) times daily before meals. Rate of 5 units/hour except smaller dose at night per patient. Patient may bolus as needed.    Marland Kitchen levothyroxine (SYNTHROID, LEVOTHROID) 25 MCG tablet Take 25 mcg by mouth daily.      Marland Kitchen MILK THISTLE PO Take 450 mg by mouth daily.     . pantoprazole (PROTONIX) 40 MG tablet TAKE 1 BY MOUTH DAILY 90 tablet 3  . PARoxetine (PAXIL) 40 MG tablet Take 40 mg by mouth every morning.    . potassium chloride SA (K-DUR,KLOR-CON) 20 MEQ tablet Take 1 tablet (20 mEq total) by mouth daily as needed. Takes when she takes lasix 90 tablet 3  . propranolol (INDERAL) 20 MG tablet Take 1 tablet (20 mg total) by mouth 2 (two) times daily. (Patient taking differently: Take 20 mg by mouth daily. ) 180 tablet 3    Results for orders placed or performed during the hospital encounter of 06/30/15 (from the past 48 hour(s))  Glucose, capillary     Status: Abnormal   Collection Time: 06/30/15 11:19 AM  Result Value Ref Range   Glucose-Capillary 129 (H) 65 - 99 mg/dL   No results found.  ROS  Blood pressure 162/63, pulse 81,  temperature 98.9 F (37.2 C), temperature source Oral, resp. rate 18, height 5' 2"  (1.575 m), weight 238 lb 3.2 oz (108.047 kg), SpO2 98 %. Physical Exam  Constitutional: She appears well-developed and well-nourished.  HENT:  Mouth/Throat: Oropharynx is clear and moist.  Eyes: Conjunctivae are normal. No scleral icterus.  Neck: No thyromegaly present.  Cardiovascular: Normal rate, regular rhythm and normal heart sounds.   No murmur heard. Respiratory: Effort normal and breath sounds normal.  GI:  Abdomen is full but soft and nontender without organomegaly or masses. Insulin pump in place  Lymphadenopathy:    She has no cervical adenopathy.     Assessment/Plan History of esophageal variceal bleed. Cirrhosis secondary to NAFLD. EGD with possible esophageal variceal banding.  Kamani Magnussen U 06/30/2015, 11:35 AM

## 2015-07-01 LAB — AFP TUMOR MARKER: AFP-Tumor Marker: 4.2 ng/mL (ref 0.0–8.3)

## 2015-07-02 ENCOUNTER — Telehealth (INDEPENDENT_AMBULATORY_CARE_PROVIDER_SITE_OTHER): Payer: Self-pay | Admitting: *Deleted

## 2015-07-02 DIAGNOSIS — K760 Fatty (change of) liver, not elsewhere classified: Secondary | ICD-10-CM

## 2015-07-02 DIAGNOSIS — K7469 Other cirrhosis of liver: Secondary | ICD-10-CM

## 2015-07-02 NOTE — Telephone Encounter (Signed)
Per Dr.Rehman the patient will need to have labs drawn in 6 months

## 2015-07-08 ENCOUNTER — Encounter (HOSPITAL_COMMUNITY): Payer: Self-pay | Admitting: Internal Medicine

## 2015-08-20 ENCOUNTER — Encounter: Payer: Self-pay | Admitting: Cardiology

## 2015-09-01 ENCOUNTER — Ambulatory Visit: Payer: Medicare Other | Admitting: Cardiology

## 2015-09-09 ENCOUNTER — Encounter: Payer: Self-pay | Admitting: *Deleted

## 2015-09-09 ENCOUNTER — Encounter: Payer: Self-pay | Admitting: Cardiovascular Disease

## 2015-09-09 ENCOUNTER — Ambulatory Visit (INDEPENDENT_AMBULATORY_CARE_PROVIDER_SITE_OTHER): Payer: Medicare Other | Admitting: Cardiovascular Disease

## 2015-09-09 VITALS — BP 144/88 | HR 80 | Ht 64.0 in | Wt 244.0 lb

## 2015-09-09 DIAGNOSIS — Z136 Encounter for screening for cardiovascular disorders: Secondary | ICD-10-CM | POA: Diagnosis not present

## 2015-09-09 DIAGNOSIS — R0602 Shortness of breath: Secondary | ICD-10-CM

## 2015-09-09 DIAGNOSIS — I10 Essential (primary) hypertension: Secondary | ICD-10-CM

## 2015-09-09 DIAGNOSIS — K7460 Unspecified cirrhosis of liver: Secondary | ICD-10-CM

## 2015-09-09 DIAGNOSIS — Z8249 Family history of ischemic heart disease and other diseases of the circulatory system: Secondary | ICD-10-CM

## 2015-09-09 DIAGNOSIS — R079 Chest pain, unspecified: Secondary | ICD-10-CM | POA: Diagnosis not present

## 2015-09-09 NOTE — Progress Notes (Signed)
Patient ID: Maria [name], female   DOB: [birth year], 59 y.o.   MRN: [id number]       CARDIOLOGY CONSULT NOTE  Patient ID: Maria [name] MRN: [id number] DOB/AGE: [birth year] 59 y.o.  Admit date: (Not on file) Primary Physician Maria [name],Maria [name]., MD  Reason for Consultation: SOB and chest pain  HPI: The patient is a 59 year old woman with a history of hypothyroidism, cirrhosis secondary to nonalcoholic fatty liver disease with esophageal variceal bleed history, hypertension, and insulin-dependent diabetes mellitus who is referred for the evaluation of shortness of breath.  As per notes from PCPs office, she presented on 11/30 with sudden onset of shortness of breath which had been occurring for weeks. It is exacerbated with exertion. She reportedly underwent coronary angiography in February 2004 which demonstrated minimal obstructive disease. She reportedly underwent echocardiography in August 2016 but I do not have this report. PCPs notes mention a normal ejection fraction.   ECG performed in the office today demonstrates normal sinus rhythm with first-degree AV block , left anterior fascicular block, in late R-wave transition.  She has been trying to lose weight by walking and wants to get under 200 pounds in the event she needs a liver transplant. Recently she be suddenly became short of breath while walking. She has also had retrosternal chest pain at times when waking up. She said her blood sugars "go up and down" and she uses an insulin pump. She has also had right sided chest pain. When she becomes anemic she becomes short of breath and when she has edema she becomes short of breath. She thinks she was diagnosed with esophageal varices back in 2010.  Soc: Does not smoke or drink alcohol. 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  Fam: All mother's brothers died of MI's in their 53's but were smokers. Father had atrial fibrillation.   No Known Allergies  Current Outpatient  Prescriptions  Medication Sig Dispense Refill  . cholecalciferol (VITAMIN D) 1000 UNITS tablet Take 1,000 Units by mouth daily.    . ferrous sulfate 325 (65 FE) MG tablet Take 325 mg by mouth daily with breakfast.    . furosemide (LASIX) 20 MG tablet Take 1 tablet (20 mg total) by mouth daily as needed. (Patient taking differently: Take 20 mg by mouth daily as needed for fluid. ) 90 tablet 3  . insulin lispro (HUMALOG) 100 UNIT/ML injection Inject into the skin 3 (three) times daily before meals. Rate of 5 units/hour except smaller dose at night per patient. Patient may bolus as needed.    Marland Kitchen levothyroxine (SYNTHROID, LEVOTHROID) 25 MCG tablet Take 25 mcg by mouth daily.      . magnesium oxide (MAG-OX) 400 MG tablet Take 400 mg by mouth daily.    Marland Kitchen MILK THISTLE PO Take 450 mg by mouth daily.     . pantoprazole (PROTONIX) 40 MG tablet TAKE 1 BY MOUTH DAILY 90 tablet 3  . PARoxetine (PAXIL) 40 MG tablet Take 40 mg by mouth every morning.    . potassium chloride SA (K-DUR,KLOR-CON) 20 MEQ tablet Take 1 tablet (20 mEq total) by mouth daily as needed. Takes when she takes lasix 90 tablet 3  . propranolol (INDERAL) 20 MG tablet Take 20 mg by mouth daily. MAY TAKE 2 TIMES DAILY     No current facility-administered medications for this visit.    Past Medical History  Diagnosis Date  . Cirrhosis ([place])   . NAFLD (nonalcoholic fatty liver disease)   .  Esophageal bleed, non-variceal   . Hypertension   . Hypothyroidism   . Fibromyalgia   . Osteoarthrosis   . Diabetes mellitus   . UTI (urinary tract infection) 11/12 end of the month    Patient feels that she passed a kidney stone at that time  . PONV (postoperative nausea and vomiting)   . Anemia   . Eye hemorrhage     Behind left eye  . Osteoporosis     Past Surgical History  Procedure Laterality Date  . Esophagogastroduodenoscopy w/ banding  08/2010  . Appendectomy  1980  . Tonsillectomy    . Bilateral salpingoophorectomy    . Vaginal  hysterectomy    . Colonoscopy  03/15/2011  . Upper gastrointestinal endoscopy  03/15/2011    EGD ED BANDING/TCS  . Upper gastrointestinal endoscopy  09/05/2010  . Upper gastrointestinal endoscopy  08/11/2010  . Esophagogastroduodenoscopy  04/24/2012    Procedure: ESOPHAGOGASTRODUODENOSCOPY (EGD);  Surgeon: [name], MD;  [facility];  Service: Endoscopy;  Laterality: N/A;  300  . Esophageal banding  04/24/2012    Procedure: ESOPHAGEAL BANDING;  Surgeon: [name], MD;  [facility];  Service: Endoscopy;  Laterality: N/A;  . Esophageal banding  08/03/2012    [facility] in [place] , [place]  . Esophagogastroduodenoscopy (egd) with propofol  09/24/2012    Procedure: ESOPHAGOGASTRODUODENOSCOPY (EGD) WITH PROPOFOL;  Surgeon: [name], MD;  [facility]: [facility];  Service: Endoscopy;  Laterality: N/A;  GE junction at 36  . Esophageal banding  09/24/2012    Procedure: ESOPHAGEAL BANDING;  Surgeon: [name], MD;  Location: [facility];  Service: Endoscopy;  Laterality: N/A;  Banding x 3  . Esophagogastroduodenoscopy N/A 01/29/2013    Procedure: ESOPHAGOGASTRODUODENOSCOPY (EGD);  Surgeon: [name], MD;  [facility];  Service: Endoscopy;  Laterality: N/A;  1200  . Esophageal banding N/A 01/29/2013    Procedure: ESOPHAGEAL BANDING;  Surgeon: [name], MD;  [facility];  Service: Endoscopy;  Laterality: N/A;  . Cholecystectomy    . Esophagogastroduodenoscopy N/A 05/22/2013    Procedure: ESOPHAGOGASTRODUODENOSCOPY (EGD);  Surgeon: [name], MD;  [facility];  Service: Endoscopy;  Laterality: N/A;  1:55  . Esophagogastroduodenoscopy N/A 12/31/2013    Procedure: ESOPHAGOGASTRODUODENOSCOPY (EGD);  Surgeon: [name], MD;  [facility];  Service: Endoscopy;  Laterality: N/A;  1200  . Esophagogastroduodenoscopy N/A 06/19/2014    Procedure: ESOPHAGOGASTRODUODENOSCOPY (EGD);  Surgeon: [name],  MD;  [facility];  Service: Endoscopy;  Laterality: N/A;  1055  . Esophageal banding N/A 06/19/2014    Procedure: ESOPHAGEAL BANDING;  Surgeon: [name], MD;  [facility];  Service: Endoscopy;  Laterality: N/A;  . Esophagogastroduodenoscopy N/A 01/15/2015    Procedure: ESOPHAGOGASTRODUODENOSCOPY (EGD);  Surgeon: [name], MD;  [facility];  Service: Endoscopy;  Laterality: N/A;  730 - moved to 9:45 - moved to 1250-Ann notified pt  . Esophagogastroduodenoscopy N/A 06/30/2015    Procedure: ESOPHAGOGASTRODUODENOSCOPY (EGD);  Surgeon: [name], MD;  [facility];  Service: Endoscopy;  Laterality: N/A;  1200    Social History   Social History  . Marital Status: Married    Spouse Name: N/A  . Number of Children: N/A  . Years of Education: N/A   Occupational History  . Not on file.   Social History Main Topics  . Smoking status: Never  Smoker   . Smokeless tobacco: Never Used  . Alcohol Use: No  . Drug Use: No  . Sexual Activity: Not on file   Other Topics Concern  . Not on file   Social History Narrative       Prior to Admission medications   Medication Sig Start Date End Date Taking? Authorizing Provider  cholecalciferol (VITAMIN D) 1000 UNITS tablet Take 1,000 Units by mouth daily.    Historical Provider, MD  ferrous sulfate 325 (65 FE) MG tablet Take 325 mg by mouth daily with breakfast.    Historical Provider, MD  furosemide (LASIX) 20 MG tablet Take 1 tablet (20 mg total) by mouth daily as needed. Patient taking differently: Take 20 mg by mouth daily as needed for fluid.  05/05/13   [name], MD  insulin lispro (HUMALOG) 100 UNIT/ML injection Inject into the skin 3 (three) times daily before meals. Rate of 5 units/hour except smaller dose at night per patient. Patient may bolus as needed.    Historical Provider, MD  levothyroxine (SYNTHROID, LEVOTHROID) 25 MCG tablet Take 25 mcg by mouth daily.       Historical Provider, MD  MILK THISTLE PO Take 450 mg by mouth daily.     Historical Provider, MD  pantoprazole (PROTONIX) 40 MG tablet TAKE 1 BY MOUTH DAILY 08/24/14   [name], MD  PARoxetine (PAXIL) 40 MG tablet Take 40 mg by mouth every morning.    Historical Provider, MD  potassium chloride SA (K-DUR,KLOR-CON) 20 MEQ tablet Take 1 tablet (20 mEq total) by mouth daily as needed. Takes when she takes lasix 05/05/13   [name], MD  propranolol (INDERAL) 20 MG tablet Take 1 tablet (20 mg total) by mouth 2 (two) times daily. Patient taking differently: Take 20 mg by mouth daily.  05/05/13   [name], MD     Review of systems complete and found to be negative unless listed above in HPI     Physical exam Blood pressure 144/88, pulse 80, height 5' 4"  (1.626 m), weight 244 lb (110.678 kg), SpO2 93 %. General: NAD Neck: No JVD, no thyromegaly or thyroid nodule.  Lungs: Clear to auscultation bilaterally with normal respiratory effort. CV: Nondisplaced PMI. Regular rate and rhythm, normal S1/S2, no B6/L8, 1/6 systolic murmur over RUSB.  No peripheral edema.  No carotid bruit.  Normal pedal pulses.  Abdomen: Soft, obese. Skin: Intact without lesions or rashes.  Neurologic: Alert and oriented x 3.  Psych: Normal affect. Extremities: No clubbing or cyanosis.  HEENT: Normal.   ECG: Most recent ECG reviewed.  Labs:   Lab Results  Component Value Date   WBC 5.2 10/22/2014   HGB 13.1 10/22/2014   HCT 40.1 10/22/2014   MCV 80.7 10/22/2014   PLT SEE NOTE 10/22/2014   No results for input(s): NA, K, CL, CO2, BUN, CREATININE, CALCIUM, PROT, BILITOT, ALKPHOS, ALT, AST, GLUCOSE in the last 168 hours.  Invalid input(s): LABALBU Lab Results  Component Value Date   CKTOTAL 109 08/11/2010   CKMB 2.4 08/11/2010   TROPONINI 0.03        NO INDICATION OF MYOCARDIAL INJURY. 08/11/2010   No results found for: CHOL No results found for: HDL No results found for: LDLCALC No  results found for: TRIG No results found for: CHOLHDL No results found for: LDLDIRECT       Studies: No results found.  ASSESSMENT AND PLAN:  1. Chest pain and shortness of breath: Has  typical and atypical features of ischemic heart disease. Cardiovascular risk factors include insulin-dependent diabetes mellitus and hypertension as well as obesity and liver disease. Will obtain a Lexiscan Cardiolite stress test for further clarification. Will obtain copy of echocardiogram report.  2. Essential HTN: Mildly elevated. Will monitor.  Dispo: f/u 1 month.  Signed: Kate Sable, M.D., F.A.C.C.  09/09/2015, 9:09 AM

## 2015-09-09 NOTE — Patient Instructions (Signed)
Your physician recommends that you schedule a follow-up appointment in: 1 month with Dr. Bronson Ing  Your physician recommends that you continue on your current medications as directed. Please refer to the Current Medication list given to you today.  Your physician has requested that you have a lexiscan myoview. For further information please visit [email or web address]. Please follow instruction sheet, as given.  Thank you for choosing [facility]!!

## 2015-09-22 ENCOUNTER — Encounter (HOSPITAL_COMMUNITY): Payer: Self-pay

## 2015-09-22 ENCOUNTER — Encounter (HOSPITAL_COMMUNITY)
Admission: RE | Admit: 2015-09-22 | Discharge: 2015-09-22 | Disposition: A | Discharge status: Home / Self Care | Payer: Medicare Other | Source: Ambulatory Visit | Attending: Cardiovascular Disease | Admitting: Cardiovascular Disease

## 2015-09-22 ENCOUNTER — Inpatient Hospital Stay (HOSPITAL_COMMUNITY): Admission: RE | Admit: 2015-09-22 | Payer: Medicare Other | Source: Ambulatory Visit

## 2015-09-22 DIAGNOSIS — R079 Chest pain, unspecified: Secondary | ICD-10-CM | POA: Diagnosis not present

## 2015-09-22 LAB — NM MYOCAR MULTI W/SPECT W/WALL MOTION / EF
LV dias vol: 102 mL
LV sys vol: 47 mL
Peak HR: 88 {beats}/min
RATE: 0.47
Rest HR: 71 {beats}/min
SDS: 0
SRS: 0
SSS: 0
TID: 1.23

## 2015-09-22 MED ORDER — TECHNETIUM TC 99M SESTAMIBI GENERIC - CARDIOLITE
10.0000 | Freq: Once | INTRAVENOUS | Status: AC | PRN
  Administered 2015-09-22: 10 via INTRAVENOUS

## 2015-09-22 MED ORDER — TECHNETIUM TC 99M SESTAMIBI - CARDIOLITE
30.0000 | Freq: Once | INTRAVENOUS | Status: AC | PRN
  Administered 2015-09-22: 28.5 via INTRAVENOUS

## 2015-09-22 MED ORDER — SODIUM CHLORIDE 0.9 % IJ SOLN
INTRAMUSCULAR | Status: AC
  Administered 2015-09-22: 10 mL via INTRAVENOUS
  Filled 2015-09-22: qty 3

## 2015-09-22 MED ORDER — REGADENOSON 0.4 MG/5ML IV SOLN
INTRAVENOUS | Status: AC
  Administered 2015-09-22: 0.4 mg via INTRAVENOUS
  Filled 2015-09-22: qty 5

## 2015-09-23 ENCOUNTER — Telehealth: Payer: Self-pay | Admitting: *Deleted

## 2015-09-23 NOTE — Telephone Encounter (Signed)
Patient informed. 

## 2015-09-23 NOTE — Telephone Encounter (Signed)
-----   Message from Arnoldo Lenis, MD sent at 09/23/2015 11:55 AM EST ----- Stress test looks good, no evidence of any significant blockages. Dr Raliegh Ip will discuss in detail at there f/u in 1 month  Zandra Abts MD

## 2015-10-05 ENCOUNTER — Other Ambulatory Visit (INDEPENDENT_AMBULATORY_CARE_PROVIDER_SITE_OTHER): Payer: Self-pay | Admitting: Internal Medicine

## 2015-10-05 ENCOUNTER — Ambulatory Visit (INDEPENDENT_AMBULATORY_CARE_PROVIDER_SITE_OTHER): Payer: Medicare Other | Admitting: Internal Medicine

## 2015-10-05 ENCOUNTER — Encounter (INDEPENDENT_AMBULATORY_CARE_PROVIDER_SITE_OTHER): Payer: Self-pay | Admitting: Internal Medicine

## 2015-10-05 ENCOUNTER — Encounter (INDEPENDENT_AMBULATORY_CARE_PROVIDER_SITE_OTHER): Payer: Self-pay | Admitting: *Deleted

## 2015-10-05 VITALS — BP 146/86 | HR 76 | Temp 98.5°F | Ht 64.0 in | Wt 241.3 lb

## 2015-10-05 DIAGNOSIS — K7460 Unspecified cirrhosis of liver: Secondary | ICD-10-CM | POA: Diagnosis not present

## 2015-10-05 DIAGNOSIS — I8500 Esophageal varices without bleeding: Secondary | ICD-10-CM

## 2015-10-05 NOTE — Patient Instructions (Addendum)
Labs and [place] RUQ OV in 3 months

## 2015-10-05 NOTE — Progress Notes (Signed)
Subjective:    Patient ID: Maria [name], female    DOB: 1956/01/23, 60 y.o.   MRN: [id number]  HPI Here today for f/u. Hx of Cirrhosis complicated by esophageal varices.  She had bled x 2 from esophageal varices.First in 2011 and second in 2013 while she was at the beach.  She underwent an EGD in October, 2016 which revealed two short columns of grade 1 esophageal varices not large enough to be banded. Gastric antral vascular ectasia without stigmata of bleed.   She tells me she is doing okay. She says she is having a lot of indigestion. She tells me she underwent a stress test 09/22/2015 which revealed: Overall image quality is poor. Image quality affected due to significant extracardiac activity and low counts.  No diagnostic ST segment changes to indicate ischemia.  No significant, reversible perfusion defects to indicate ischemia.  This is a low risk study. Nuclear stress EF: 54%.   Her appetite is good for the most part. She has gained about 4 pounds since her last visit in August with me. She is not exercising. She is waiting on the results of the stress test. She usually has a BM daily. She alternates between constipation and diarrhea. She denies melena or BRRB. She denies any nausea or vomiting. No NSAIDs.  Family hx of colon cancer in a brother age 37. Her last colonoscopy was in June of 2012.  CBC    Component Value Date/Time   WBC 5.2 10/22/2014 0949   RBC 4.97 10/22/2014 0949   RBC 3.16* 08/11/2010 0444   HGB 13.1 10/22/2014 0949   HCT 40.1 10/22/2014 0949   PLT SEE NOTE 10/22/2014 0949   MCV 80.7 10/22/2014 0949   MCH 26.4 10/22/2014 0949   MCHC 32.7 10/22/2014 0949   RDW 16.5* 10/22/2014 0949   LYMPHSABS 1.1 03/15/2011 1555   MONOABS 0.4 03/15/2011 1555   EOSABS 0.1 03/15/2011 1555   BASOSABS 0.0 03/15/2011 1555      CMP Latest Ref Rng 10/22/2014 04/01/2014 11/07/2013  Glucose 70 - 99 mg/dL 138(H) 126(H) 110(H)  BUN 6 - 23 mg/dL 10 6 10   Creatinine 0.50 -  1.10 mg/dL 0.69 0.66 0.57  Sodium 135 - 145 mEq/L 140 141 143  Potassium 3.5 - 5.3 mEq/L 3.7 3.8 3.7  Chloride 96 - 112 mEq/L 105 107 110  CO2 19 - 32 mEq/L 31 26 27   Calcium 8.4 - 10.5 mg/dL 9.4 8.9 8.7  Total Protein 6.0 - 8.3 g/dL 7.2 6.9 6.2  Total Bilirubin 0.2 - 1.2 mg/dL 1.4(H) 1.1 1.1  Alkaline Phos 39 - 117 U/L 85 92 83  AST 0 - 37 U/L 35 30 27  ALT 0 - 35 U/L 26 22 20     06/30/2015 AFP 4.2      06/30/2015 EGD  Indications: Patient is 60 year old Caucasian female with cirrhosis secondary to non-alcoholic steatohepatitis complicated by esophageal variceal bleed(November 2011 and November 2013) who was undergone periodic esophageal variceal banding. Last EGD was in April 2016 and she did not require any banding.  Impression: Two short columns of grade 1 esophageal varices not large enough to be banded. Focal esophageal scarring from previous banding. Portal gastropathy. Gastric antral vascular ectasia without stigmata of bleed.   01/30/2013 EGD with esophageal varices banding:   Impression: Two columns of esophageal varices were banded. Portal gastropathy. Gastric antral vascular ectasia  few hyperplastic appearing polyps at antrum       09/24/2012 Esophagogastroduodenoscopy with esophageal variceal banding:  FINAL DIAGNOSES: 1. Recurrent esophageal varices. Three columns banded. 2. Portal gastropathy. 3. Gastric antral vascular ectasia.    05/17/2015 US abdomen: surveillance for cirrhosis IMPRESSION: Evidence of hepatic cirrhosis. The echotexture of the liver is increased consistent with underlying parenchymal disease. There may be a degree of superimposed hepatic steatosis is well. While no focal liver lesions are identified, it must be cautioned that the sensitivity of ultrasound for focal liver lesions is diminished in this  circumstance. Gallbladder absent.    03/15/2011: Esophagogastroduodenoscopy with esophageal variceal banding followed by a colonoscopy with snare polypectomy.  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 She was banded acutely in November 2011 and electively in December 2011. She is maintained on nadolol. She is here for elective EGD and if she has recurrent varices, they would be banded. She is also undergoing screening colonoscopy. Family history is positive for colon carcinoma in a brother who developed disease at age 34 and appears to be disease free over a year later.  FINAL DIAGNOSES: 1. Recurrent esophageal varices grade III, 3 columns; these were  banded. 2. Mild portal gastropathy. 3. Stable antral mucosal nodularity. 4. Two small polyps ablated via cold biopsy, one from the cecum and  second one from the ascending colon. 5. A 6-7 mm polyp snared from proximal transverse colon. 6. A single arteriovenous malformation at splenic flexure. 7. Two small polyps were coagulated using snare, one at splenic  flexure and one at descending colon. Biopsy: Fragments of tubular adenoma. No high grade dysplasia or malignancy identified.   Review of Systems Past Medical History  Diagnosis Date  . Cirrhosis ([place])   . NAFLD (nonalcoholic fatty liver disease)   . Esophageal bleed, non-variceal   . Hypertension   . Hypothyroidism   . Fibromyalgia   . Osteoarthrosis   . Diabetes mellitus   . UTI (urinary tract infection) 11/12 end of the month    Patient feels that she passed a kidney stone at that time  . PONV (postoperative nausea and vomiting)   . Anemia   . Eye hemorrhage     Behind left eye  . Osteoporosis     Past Surgical History  Procedure Laterality Date  . Esophagogastroduodenoscopy w/ banding  08/2010  . Appendectomy  1980  . Tonsillectomy    .  Bilateral salpingoophorectomy    . Vaginal hysterectomy    . Colonoscopy  03/15/2011  . Upper gastrointestinal endoscopy  03/15/2011    EGD ED BANDING/TCS  . Upper gastrointestinal endoscopy  09/05/2010  . Upper gastrointestinal endoscopy  08/11/2010  . Esophagogastroduodenoscopy  04/24/2012    Procedure: ESOPHAGOGASTRODUODENOSCOPY (EGD);  Surgeon: [name], MD;  [facility];  Service: Endoscopy;  Laterality: N/A;  300  . Esophageal banding  04/24/2012    Procedure: ESOPHAGEAL BANDING;  Surgeon: [name], MD;  [facility];  Service: Endoscopy;  Laterality: N/A;  . Esophageal banding  08/03/2012    [facility] in [place] , [place]  . Esophagogastroduodenoscopy (egd) with propofol  09/24/2012    Procedure: ESOPHAGOGASTRODUODENOSCOPY (EGD) WITH PROPOFOL;  Surgeon: [name], MD;  Location: [facility];  Service: Endoscopy;  Laterality: N/A;  GE junction at 36  . Esophageal banding  09/24/2012    Procedure: ESOPHAGEAL BANDING;  Surgeon: [name], MD;  [facility]: [facility];  Service: Endoscopy;  Laterality: N/A;  Banding x 3  . Esophagogastroduodenoscopy N/A  01/29/2013    Procedure: ESOPHAGOGASTRODUODENOSCOPY (EGD);  Surgeon: [name], MD;  [facility];  Service: Endoscopy;  Laterality: N/A;  1200  . Esophageal banding N/A 01/29/2013    Procedure: ESOPHAGEAL BANDING;  Surgeon: [name], MD;  [facility];  Service: Endoscopy;  Laterality: N/A;  . Cholecystectomy    . Esophagogastroduodenoscopy N/A 05/22/2013    Procedure: ESOPHAGOGASTRODUODENOSCOPY (EGD);  Surgeon: [name], MD;  [facility];  Service: Endoscopy;  Laterality: N/A;  1:55  . Esophagogastroduodenoscopy N/A 12/31/2013    Procedure: ESOPHAGOGASTRODUODENOSCOPY (EGD);  Surgeon: [name], MD;  [facility];  Service: Endoscopy;  Laterality: N/A;  1200  . Esophagogastroduodenoscopy N/A 06/19/2014    Procedure:  ESOPHAGOGASTRODUODENOSCOPY (EGD);  Surgeon: [name], MD;  [facility];  Service: Endoscopy;  Laterality: N/A;  1055  . Esophageal banding N/A 06/19/2014    Procedure: ESOPHAGEAL BANDING;  Surgeon: [name], MD;  [facility];  Service: Endoscopy;  Laterality: N/A;  . Esophagogastroduodenoscopy N/A 01/15/2015    Procedure: ESOPHAGOGASTRODUODENOSCOPY (EGD);  Surgeon: [name], MD;  [facility];  Service: Endoscopy;  Laterality: N/A;  730 - moved to 9:45 - moved to 1250-Ann notified pt  . Esophagogastroduodenoscopy N/A 06/30/2015    Procedure: ESOPHAGOGASTRODUODENOSCOPY (EGD);  Surgeon: [name], MD;  [facility];  Service: Endoscopy;  Laterality: N/A;  1200    No Known Allergies  Current Outpatient Prescriptions on File Prior to Visit  Medication Sig Dispense Refill  . cholecalciferol (VITAMIN D) 1000 UNITS tablet Take 1,000 Units by mouth daily.    . furosemide (LASIX) 20 MG tablet Take 1 tablet (20 mg total) by mouth daily as needed. (Patient taking differently: Take 20 mg by mouth daily as needed for fluid. ) 90 tablet 3  . insulin lispro (HUMALOG) 100 UNIT/ML injection Inject into the skin 3 (three) times daily before meals. Rate of 5 units/hour except smaller dose at night per patient. Patient may bolus as needed.    Marland Kitchen levothyroxine (SYNTHROID, LEVOTHROID) 25 MCG tablet Take 25 mcg by mouth daily.      . magnesium oxide (MAG-OX) 400 MG tablet Take 400 mg by mouth daily.    Marland Kitchen MILK THISTLE PO Take 450 mg by mouth daily.     . pantoprazole (PROTONIX) 40 MG tablet TAKE 1 BY MOUTH DAILY 90 tablet 3  . PARoxetine (PAXIL) 40 MG tablet Take 40 mg by mouth every morning.    . potassium chloride SA (K-DUR,KLOR-CON) 20 MEQ tablet Take 1 tablet (20 mEq total) by mouth daily as needed. Takes when she takes lasix 90 tablet 3  . propranolol (INDERAL) 20 MG tablet Take 20 mg by mouth daily. MAY TAKE 2 TIMES DAILY    . ferrous sulfate  325 (65 FE) MG tablet Take 325 mg by mouth as needed. Reported on 10/05/2015     No current facility-administered medications on file prior to visit.        Objective:   Physical Exam Blood pressure 146/86, pulse 76, temperature 98.5 F (36.9 C), height 5' 4"  (1.626 m), weight 241 lb 4.8 oz (109.453 kg). Alert and oriented. Skin warm and dry. Oral mucosa is moist.   . Sclera anicteric, conjunctivae is pink. Thyroid not enlarged. No cervical lymphadenopathy. Lungs clear. Heart regular rate and rhythm.  Abdomen is soft. Bowel sounds are positive. No hepatomegaly. No abdominal masses felt. No tenderness1+ edema  to lower extremities.          Assessment & Plan:   Assessment/Plan History of esophageal variceal bleed. Last EGD in October, Varices did not need banding. Cirrhosis secondary to NAFLD. Thrombocytopenia related to her liver disease.  AFP, CBC, Hepatic function today, [place] RUQ in February Scheduled for EGD/possible banding in April.  Family hx of colon cancer, hx of tubular adenoma: She will be due for a colonoscopy in June of this year.

## 2015-10-06 LAB — HEPATIC FUNCTION PANEL
ALT: 20 U/L (ref 6–29)
AST: 30 U/L (ref 10–35)
Albumin: 3.3 g/dL — ABNORMAL LOW (ref 3.6–5.1)
Alkaline Phosphatase: 84 U/L (ref 33–130)
Bilirubin, Direct: 0.2 mg/dL (ref <=0.2)
Indirect Bilirubin: 0.7 mg/dL (ref 0.2–1.2)
Total Bilirubin: 0.9 mg/dL (ref 0.2–1.2)
Total Protein: 6.2 g/dL (ref 6.1–8.1)

## 2015-10-06 LAB — CBC WITH DIFFERENTIAL/PLATELET
Basophils Absolute: 0 10*3/uL (ref 0.0–0.1)
Basophils Relative: 1 % (ref 0–1)
Eosinophils Absolute: 0.1 10*3/uL (ref 0.0–0.7)
Eosinophils Relative: 2 % (ref 0–5)
HCT: 35.1 % — ABNORMAL LOW (ref 36.0–46.0)
Hemoglobin: 10.8 g/dL — ABNORMAL LOW (ref 12.0–15.0)
Lymphocytes Relative: 26 % (ref 12–46)
Lymphs Abs: 0.8 10*3/uL (ref 0.7–4.0)
MCH: 23.4 pg — ABNORMAL LOW (ref 26.0–34.0)
MCHC: 30.8 g/dL (ref 30.0–36.0)
MCV: 76 fL — ABNORMAL LOW (ref 78.0–100.0)
Monocytes Absolute: 0.3 10*3/uL (ref 0.1–1.0)
Monocytes Relative: 10 % (ref 3–12)
Neutro Abs: 1.9 10*3/uL (ref 1.7–7.7)
Neutrophils Relative %: 61 % (ref 43–77)
RBC: 4.62 MIL/uL (ref 3.87–5.11)
RDW: 16.9 % — ABNORMAL HIGH (ref 11.5–15.5)
WBC: 3.1 10*3/uL — ABNORMAL LOW (ref 4.0–10.5)

## 2015-10-06 LAB — AFP TUMOR MARKER: AFP-Tumor Marker: 4.6 ng/mL (ref <6.1)

## 2015-10-07 ENCOUNTER — Telehealth (INDEPENDENT_AMBULATORY_CARE_PROVIDER_SITE_OTHER): Payer: Self-pay | Admitting: *Deleted

## 2015-10-07 DIAGNOSIS — K7030 Alcoholic cirrhosis of liver without ascites: Secondary | ICD-10-CM

## 2015-10-07 NOTE — Telephone Encounter (Signed)
.  Per Lelon Perla , patient to have lab work drawn in 2 weeks.

## 2015-10-08 ENCOUNTER — Ambulatory Visit (INDEPENDENT_AMBULATORY_CARE_PROVIDER_SITE_OTHER): Payer: Medicare Other | Admitting: Cardiovascular Disease

## 2015-10-08 ENCOUNTER — Encounter: Payer: Self-pay | Admitting: Cardiovascular Disease

## 2015-10-08 VITALS — BP 138/71 | HR 71 | Ht 64.0 in | Wt 245.0 lb

## 2015-10-08 DIAGNOSIS — Z136 Encounter for screening for cardiovascular disorders: Secondary | ICD-10-CM | POA: Diagnosis not present

## 2015-10-08 DIAGNOSIS — I10 Essential (primary) hypertension: Secondary | ICD-10-CM

## 2015-10-08 DIAGNOSIS — K7460 Unspecified cirrhosis of liver: Secondary | ICD-10-CM

## 2015-10-08 DIAGNOSIS — R0602 Shortness of breath: Secondary | ICD-10-CM | POA: Diagnosis not present

## 2015-10-08 DIAGNOSIS — IMO0001 Reserved for inherently not codable concepts without codable children: Secondary | ICD-10-CM

## 2015-10-08 DIAGNOSIS — R079 Chest pain, unspecified: Secondary | ICD-10-CM | POA: Diagnosis not present

## 2015-10-08 DIAGNOSIS — Z8249 Family history of ischemic heart disease and other diseases of the circulatory system: Secondary | ICD-10-CM

## 2015-10-08 NOTE — Patient Instructions (Signed)
Your physician recommends that you schedule a follow-up appointment AS NEEDED WITH DR. Bronson Ing  Your physician recommends that you continue on your current medications as directed. Please refer to the Current Medication list given to you today.  Thank you for choosing [facility]!!

## 2015-10-08 NOTE — Progress Notes (Signed)
Patient ID: Maria [name], female   DOB: Jun 09, 1956, 60 y.o.   MRN: [id number]      SUBJECTIVE: The patient returns for follow-up after undergoing cardiovascular testing performed for the evaluation of chest pain and shortness of breath.  Nuclear stress test on 1/4 was low risk with no evidence of ischemia, calculated LVEF 54%.  She is feeling better. She's had some more leg swelling and is taking Lasix.  Review of Systems: As per "subjective", otherwise negative.  No Known Allergies  Current Outpatient Prescriptions  Medication Sig Dispense Refill  . cholecalciferol (VITAMIN D) 1000 UNITS tablet Take 1,000 Units by mouth daily.    . ferrous sulfate 325 (65 FE) MG tablet Take 325 mg by mouth as needed. Reported on 10/05/2015    . furosemide (LASIX) 20 MG tablet Take 1 tablet (20 mg total) by mouth daily as needed. (Patient taking differently: Take 20 mg by mouth daily as needed for fluid. ) 90 tablet 3  . insulin lispro (HUMALOG) 100 UNIT/ML injection Inject into the skin 3 (three) times daily before meals. Rate of 5 units/hour except smaller dose at night per patient. Patient may bolus as needed.    Marland Kitchen levothyroxine (SYNTHROID, LEVOTHROID) 25 MCG tablet Take 25 mcg by mouth daily.      . magnesium oxide (MAG-OX) 400 MG tablet Take 400 mg by mouth daily.    Marland Kitchen MILK THISTLE PO Take 450 mg by mouth daily.     . pantoprazole (PROTONIX) 40 MG tablet TAKE 1 BY MOUTH DAILY 90 tablet 3  . PARoxetine (PAXIL) 40 MG tablet Take 40 mg by mouth every morning.    . potassium chloride SA (K-DUR,KLOR-CON) 20 MEQ tablet Take 1 tablet (20 mEq total) by mouth daily as needed. Takes when she takes lasix 90 tablet 3  . propranolol (INDERAL) 20 MG tablet Take 20 mg by mouth daily. MAY TAKE 2 TIMES DAILY    . vitamin E 400 UNIT capsule Take 400 Units by mouth daily.     No current facility-administered medications for this visit.    Past Medical History  Diagnosis Date  . Cirrhosis ([place])   . NAFLD  (nonalcoholic fatty liver disease)   . Esophageal bleed, non-variceal   . Hypertension   . Hypothyroidism   . Fibromyalgia   . Osteoarthrosis   . Diabetes mellitus   . UTI (urinary tract infection) 11/12 end of the month    Patient feels that she passed a kidney stone at that time  . PONV (postoperative nausea and vomiting)   . Anemia   . Eye hemorrhage     Behind left eye  . Osteoporosis     Past Surgical History  Procedure Laterality Date  . Esophagogastroduodenoscopy w/ banding  08/2010  . Appendectomy  1980  . Tonsillectomy    . Bilateral salpingoophorectomy    . Vaginal hysterectomy    . Colonoscopy  03/15/2011  . Upper gastrointestinal endoscopy  03/15/2011    EGD ED BANDING/TCS  . Upper gastrointestinal endoscopy  09/05/2010  . Upper gastrointestinal endoscopy  08/11/2010  . Esophagogastroduodenoscopy  04/24/2012    Procedure: ESOPHAGOGASTRODUODENOSCOPY (EGD);  Surgeon: [name], MD;  [facility];  Service: Endoscopy;  Laterality: N/A;  300  . Esophageal banding  04/24/2012    Procedure: ESOPHAGEAL BANDING;  Surgeon: [name], MD;  [facility];  Service: Endoscopy;  Laterality: N/A;  . Esophageal banding  08/03/2012    [facility] in  [place] , [place]  . Esophagogastroduodenoscopy (egd) with propofol  09/24/2012    Procedure: ESOPHAGOGASTRODUODENOSCOPY (EGD) WITH PROPOFOL;  Surgeon: [name], MD;  [facility]: [facility];  Service: Endoscopy;  Laterality: N/A;  GE junction at 36  . Esophageal banding  09/24/2012    Procedure: ESOPHAGEAL BANDING;  Surgeon: [name], MD;  [facility]: [facility];  Service: Endoscopy;  Laterality: N/A;  Banding x 3  . Esophagogastroduodenoscopy N/A 01/29/2013    Procedure: ESOPHAGOGASTRODUODENOSCOPY (EGD);  Surgeon: [name], MD;  [facility];  Service: Endoscopy;  Laterality: N/A;  1200  . Esophageal banding N/A 01/29/2013    Procedure: ESOPHAGEAL BANDING;  Surgeon: [name], MD;  [facility];  Service: Endoscopy;  Laterality: N/A;  . Cholecystectomy    . Esophagogastroduodenoscopy N/A 05/22/2013    Procedure: ESOPHAGOGASTRODUODENOSCOPY (EGD);  Surgeon: [name], MD;  [facility];  Service: Endoscopy;  Laterality: N/A;  1:55  . Esophagogastroduodenoscopy N/A 12/31/2013    Procedure: ESOPHAGOGASTRODUODENOSCOPY (EGD);  Surgeon: [name], MD;  [facility];  Service: Endoscopy;  Laterality: N/A;  1200  . Esophagogastroduodenoscopy N/A 06/19/2014    Procedure: ESOPHAGOGASTRODUODENOSCOPY (EGD);  Surgeon: [name], MD;  [facility];  Service: Endoscopy;  Laterality: N/A;  1055  . Esophageal banding N/A 06/19/2014    Procedure: ESOPHAGEAL BANDING;  Surgeon: [name], MD;  [facility];  Service: Endoscopy;  Laterality: N/A;  . Esophagogastroduodenoscopy N/A 01/15/2015    Procedure: ESOPHAGOGASTRODUODENOSCOPY (EGD);  Surgeon: [name], MD;  [facility];  Service: Endoscopy;  Laterality: N/A;  730 - moved to 9:45 - moved to 1250-Ann notified pt  . Esophagogastroduodenoscopy N/A 06/30/2015    Procedure: ESOPHAGOGASTRODUODENOSCOPY (EGD);  Surgeon: [name], MD;  [facility];  Service: Endoscopy;  Laterality: N/A;  1200    Social History   Social History  . Marital Status: Married    Spouse Name: N/A  . Number of Children: N/A  . Years of Education: N/A   Occupational History  . Not on file.   Social History Main Topics  . Smoking status: Never Smoker   . Smokeless tobacco: Never Used  . Alcohol Use: No  . Drug Use: No  . Sexual Activity: Not on file   Other Topics Concern  . Not on file   Social History Narrative     Filed Vitals:   10/08/15 1057  BP: 138/71  Pulse: 71  Height: 5' 4"  (1.626 m)  Weight: 245 lb (111.131 kg)  SpO2: 97%    PHYSICAL EXAM General: NAD Neck: No JVD, no thyromegaly or thyroid nodule.  Lungs:  Clear to auscultation bilaterally with normal respiratory effort. CV: Nondisplaced PMI. Regular rate and rhythm, normal S1/S2, no Q4/B2, 1/6 systolic murmur over RUSB. Trace peripheral edema. No carotid bruit. Normal pedal pulses.  Abdomen: Soft, obese. Skin: Intact without lesions or rashes.  Neurologic: Alert and oriented x 3.  Psych: Normal affect. Extremities: No clubbing or cyanosis.  HEENT: Normal.   ECG: Most recent ECG reviewed.      ASSESSMENT AND PLAN: 1. Chest pain and shortness of breath: Low risk Lexiscan Cardiolite stress test with normal LV systolic function. Symptomatically improved.  2. Essential HTN: Reasonably controlled. No changes to therapy.  Dispo: f/u prn.   Kate Sable, M.D., F.A.C.C.

## 2015-10-11 ENCOUNTER — Encounter (INDEPENDENT_AMBULATORY_CARE_PROVIDER_SITE_OTHER): Payer: Self-pay | Admitting: *Deleted

## 2015-10-11 ENCOUNTER — Other Ambulatory Visit (INDEPENDENT_AMBULATORY_CARE_PROVIDER_SITE_OTHER): Payer: Self-pay | Admitting: *Deleted

## 2015-10-11 DIAGNOSIS — K7030 Alcoholic cirrhosis of liver without ascites: Secondary | ICD-10-CM

## 2015-10-22 LAB — CBC
HCT: 34.5 % — ABNORMAL LOW (ref 36.0–46.0)
Hemoglobin: 10.6 g/dL — ABNORMAL LOW (ref 12.0–15.0)
MCH: 23.3 pg — ABNORMAL LOW (ref 26.0–34.0)
MCHC: 30.7 g/dL (ref 30.0–36.0)
MCV: 76 fL — ABNORMAL LOW (ref 78.0–100.0)
Platelets: 18 10*3/uL — CL (ref 150–400)
RBC: 4.54 MIL/uL (ref 3.87–5.11)
RDW: 16.9 % — ABNORMAL HIGH (ref 11.5–15.5)
WBC: 4.2 10*3/uL (ref 4.0–10.5)

## 2015-10-25 ENCOUNTER — Telehealth (INDEPENDENT_AMBULATORY_CARE_PROVIDER_SITE_OTHER): Payer: Self-pay | Admitting: Internal Medicine

## 2015-10-25 ENCOUNTER — Ambulatory Visit (HOSPITAL_COMMUNITY)
Admission: RE | Admit: 2015-10-25 | Discharge: 2015-10-25 | Disposition: A | Discharge status: Home / Self Care | Payer: Medicare Other | Source: Ambulatory Visit | Attending: Internal Medicine | Admitting: Internal Medicine

## 2015-10-25 DIAGNOSIS — D696 Thrombocytopenia, unspecified: Secondary | ICD-10-CM

## 2015-10-25 DIAGNOSIS — K7460 Unspecified cirrhosis of liver: Secondary | ICD-10-CM

## 2015-10-25 NOTE — Telephone Encounter (Signed)
Message left at home

## 2015-10-25 NOTE — Telephone Encounter (Signed)
CBC ordered for Friday. 10/29/2015

## 2015-10-25 NOTE — Telephone Encounter (Signed)
Ms. Riesen returned your phone call Terri.  Pt's ph# [phone] Thank you.

## 2015-10-26 NOTE — Telephone Encounter (Signed)
Results have been given to patient. She will have a repeat CBC next week to recheck her platelets.

## 2016-01-05 ENCOUNTER — Encounter (HOSPITAL_COMMUNITY)
Admission: RE | Disposition: A | Discharge status: Home / Self Care | Payer: Self-pay | Source: Ambulatory Visit | Attending: Internal Medicine

## 2016-01-05 ENCOUNTER — Ambulatory Visit (HOSPITAL_COMMUNITY)
Admission: RE | Admit: 2016-01-05 | Discharge: 2016-01-05 | Disposition: A | Discharge status: Home / Self Care | Payer: Medicare Other | Source: Ambulatory Visit | Attending: Internal Medicine | Admitting: Internal Medicine

## 2016-01-05 ENCOUNTER — Ambulatory Visit (INDEPENDENT_AMBULATORY_CARE_PROVIDER_SITE_OTHER): Payer: Medicare Other | Admitting: Internal Medicine

## 2016-01-05 ENCOUNTER — Telehealth (INDEPENDENT_AMBULATORY_CARE_PROVIDER_SITE_OTHER): Payer: Self-pay | Admitting: *Deleted

## 2016-01-05 ENCOUNTER — Encounter (HOSPITAL_COMMUNITY): Payer: Self-pay | Admitting: *Deleted

## 2016-01-05 DIAGNOSIS — Z794 Long term (current) use of insulin: Secondary | ICD-10-CM | POA: Insufficient documentation

## 2016-01-05 DIAGNOSIS — E039 Hypothyroidism, unspecified: Secondary | ICD-10-CM | POA: Insufficient documentation

## 2016-01-05 DIAGNOSIS — M1991 Primary osteoarthritis, unspecified site: Secondary | ICD-10-CM | POA: Insufficient documentation

## 2016-01-05 DIAGNOSIS — K3189 Other diseases of stomach and duodenum: Secondary | ICD-10-CM | POA: Insufficient documentation

## 2016-01-05 DIAGNOSIS — Z79899 Other long term (current) drug therapy: Secondary | ICD-10-CM | POA: Insufficient documentation

## 2016-01-05 DIAGNOSIS — K7460 Unspecified cirrhosis of liver: Secondary | ICD-10-CM

## 2016-01-05 DIAGNOSIS — I8500 Esophageal varices without bleeding: Secondary | ICD-10-CM | POA: Diagnosis not present

## 2016-01-05 DIAGNOSIS — K31819 Angiodysplasia of stomach and duodenum without bleeding: Secondary | ICD-10-CM | POA: Insufficient documentation

## 2016-01-05 DIAGNOSIS — K228 Other specified diseases of esophagus: Secondary | ICD-10-CM | POA: Diagnosis not present

## 2016-01-05 DIAGNOSIS — E119 Type 2 diabetes mellitus without complications: Secondary | ICD-10-CM | POA: Insufficient documentation

## 2016-01-05 DIAGNOSIS — I10 Essential (primary) hypertension: Secondary | ICD-10-CM | POA: Insufficient documentation

## 2016-01-05 DIAGNOSIS — K766 Portal hypertension: Secondary | ICD-10-CM

## 2016-01-05 DIAGNOSIS — I8510 Secondary esophageal varices without bleeding: Secondary | ICD-10-CM | POA: Diagnosis present

## 2016-01-05 DIAGNOSIS — K7581 Nonalcoholic steatohepatitis (NASH): Secondary | ICD-10-CM | POA: Insufficient documentation

## 2016-01-05 DIAGNOSIS — K7469 Other cirrhosis of liver: Secondary | ICD-10-CM | POA: Insufficient documentation

## 2016-01-05 DIAGNOSIS — D649 Anemia, unspecified: Secondary | ICD-10-CM

## 2016-01-05 HISTORY — PX: ESOPHAGOGASTRODUODENOSCOPY: SHX5428

## 2016-01-05 HISTORY — PX: ESOPHAGEAL BANDING: SHX5518

## 2016-01-05 LAB — GLUCOSE, CAPILLARY: Glucose-Capillary: 116 mg/dL — ABNORMAL HIGH (ref 65–99)

## 2016-01-05 SURGERY — EGD (ESOPHAGOGASTRODUODENOSCOPY)
Anesthesia: Moderate Sedation

## 2016-01-05 MED ORDER — BUTAMBEN-TETRACAINE-BENZOCAINE 2-2-14 % EX AERO
INHALATION_SPRAY | CUTANEOUS | Status: DC | PRN
  Administered 2016-01-05: 2 via TOPICAL

## 2016-01-05 MED ORDER — MEPERIDINE HCL 50 MG/ML IJ SOLN
INTRAMUSCULAR | Status: AC
  Filled 2016-01-05: qty 1

## 2016-01-05 MED ORDER — MEPERIDINE HCL 50 MG/ML IJ SOLN
INTRAMUSCULAR | Status: DC | PRN
  Administered 2016-01-05 (×2): 25 mg via INTRAVENOUS

## 2016-01-05 MED ORDER — MIDAZOLAM HCL 5 MG/5ML IJ SOLN
INTRAMUSCULAR | Status: AC
  Filled 2016-01-05: qty 10

## 2016-01-05 MED ORDER — MIDAZOLAM HCL 5 MG/5ML IJ SOLN
INTRAMUSCULAR | Status: DC | PRN
  Administered 2016-01-05: 2 mg via INTRAVENOUS
  Administered 2016-01-05: 3 mg via INTRAVENOUS
  Administered 2016-01-05 (×2): 2 mg via INTRAVENOUS

## 2016-01-05 MED ORDER — SODIUM CHLORIDE 0.9 % IV SOLN
INTRAVENOUS | Status: DC
  Administered 2016-01-05: 1000 mL via INTRAVENOUS

## 2016-01-05 NOTE — Op Note (Signed)
[facility] Patient Name: Maria [name] Procedure Date: 01/05/2016 8:27 AM MRN: [id number] Date of Birth: 1956-06-04 Attending MD: Hildred Laser , MD CSN: [id number] Age: 60 Admit Type: Outpatient Procedure:                Upper GI endoscopy Indications:              Follow-up of esophageal varices, For therapy of                            esophageal varices Providers:                Hildred Laser, MD, Lurline Del, RN, Georgeann Oppenheim,                            Technician Referring MD:             Glenda Chroman Medicines:                Cetacaine spray, Meperidine 50 mg IV, Midazolam 9                            mg IV Complications:            No immediate complications. Estimated Blood Loss:     Estimated blood loss: none. Procedure:                Pre-Anesthesia Assessment:                           - Prior to the procedure, a History and Physical                            was performed, and patient medications and                            allergies were reviewed. The patient's tolerance of                            previous anesthesia was also reviewed. The risks                            and benefits of the procedure and the sedation                            options and risks were discussed with the patient.                            All questions were answered, and informed consent                            was obtained. Prior Anticoagulants: The patient has                            taken no previous anticoagulant or antiplatelet  agents. ASA Grade Assessment: III - A patient with                            severe systemic disease. After reviewing the risks                            and benefits, the patient was deemed in                            satisfactory condition to undergo the procedure.                           After obtaining informed consent, the endoscope was                            passed under direct vision.  Throughout the                            procedure, the patient's blood pressure, pulse, and                            oxygen saturations were monitored continuously. The                            EG-[id number] ([id number]) scope was introduced through the                            mouth, and advanced to the second part of duodenum.                            The upper GI endoscopy was accomplished without                            difficulty. The patient tolerated the procedure                            well. Scope In: 8:48:37 AM Scope Out: 8:52:43 AM Total Procedure Duration: 0 hours 4 minutes 6 seconds  Findings:      The upper third of the esophagus and middle third of the esophagus were       normal.      A small post variceal banding scar was found in the lower third of the       esophagus. The scar tissue was healthy in appearance.      Grade I varices were found in the lower third of the esophagus. They       were small in size.      The Z-line was regular and was found 36 cm from the incisors.      Mild portal hypertensive gastropathy was found in the gastric fundus and       in the gastric body.      Moderate gastric antral vascular ectasia without bleeding was present in       the gastric antrum.      The exam was otherwise without abnormality.      The duodenal bulb  and second portion of the duodenum were normal.      The hypopharynx was normal. Impression:               - Normal upper third of esophagus and middle third                            of esophagus.                           - Scar in the lower third of the esophagus.                           - Grade I esophageal varices.                           - Z-line regular, 36 cm from the incisors.                           - Portal hypertensive gastropathy.                           - Gastric antral vascular ectasia without bleeding.                           - The examination was otherwise normal.                            - Normal duodenal bulb and second portion of the                            duodenum.                           - Normal hypopharynx.                           - No specimens collected. Moderate Sedation:      Moderate (conscious) sedation was administered by the endoscopy nurse       and supervised by the endoscopist. The following parameters were       monitored: oxygen saturation, heart rate, blood pressure, CO2       capnography and response to care. Total physician intraservice time was       14 minutes. Recommendation:           - Patient has a contact number available for                            emergencies. The signs and symptoms of potential                            delayed complications were discussed with the                            patient. Return to normal activities tomorrow.  Written discharge instructions were provided to the                            patient.                           - Resume previous diet today.                           - Continue present medications.                           - Repeat upper endoscopy in 6 months for                            surveillance. Procedure Code(s):        --- Professional ---                           [phone], Esophagogastroduodenoscopy, flexible,                            transoral; diagnostic, including collection of                            specimen(s) by brushing or washing, when performed                            (separate procedure)                           99152, Moderate sedation services provided by the                            same physician or other qualified health care                            professional performing the diagnostic or                            therapeutic service that the sedation supports,                            requiring the presence of an independent trained                            observer to assist in the monitoring of the                             patient's level of consciousness and physiological                            status; initial 15 minutes of intraservice time,                            patient age 63 years or older Diagnosis Code(s):        ---  Professional ---                           K22.8, Other specified diseases of esophagus                           I85.00, Esophageal varices without bleeding                           K76.6, Portal hypertension                           K31.89, Other diseases of stomach and duodenum                           K31.819, Angiodysplasia of stomach and duodenum                            without bleeding CPT copyright 2016 American Medical Association. All rights reserved. The codes documented in this report are preliminary and upon coder review may  be revised to meet current compliance requirements. Hildred Laser, MD Hildred Laser, MD 01/05/2016 9:03:41 AM This report has been signed electronically. Number of Addenda: 0

## 2016-01-05 NOTE — Discharge Instructions (Signed)
Resume usual medications and diet. CBC in 3 weeks. No driving for 24 hours. Office visit in 3 months.  Esophagogastroduodenoscopy, Care After Refer to this sheet in the next few weeks. These instructions provide you with information about caring for yourself after your procedure. Your health care provider may also give you more specific instructions. Your treatment has been planned according to current medical practices, but problems sometimes occur. Call your health care provider if you have any problems or questions after your procedure. WHAT TO EXPECT AFTER THE PROCEDURE After your procedure, it is typical to feel:  Soreness in your throat.  Pain with swallowing.  Sick to your stomach (nauseous).  Bloated.  Dizzy.  Fatigued. HOME CARE INSTRUCTIONS  Do not eat or drink anything until the numbing medicine (local anesthetic) has worn off and your gag reflex has returned. You will know that the local anesthetic has worn off when you can swallow comfortably.  Do not drive or operate machinery until directed by your health care provider.  Take medicines only as directed by your health care provider. SEEK MEDICAL CARE IF:   You cannot stop coughing.  You are not urinating at all or less than usual. SEEK IMMEDIATE MEDICAL CARE IF:  You have difficulty swallowing.  You cannot eat or drink.  You have worsening throat or chest pain.  You have dizziness or lightheadedness or you faint.  You have nausea or vomiting.  You have chills.  You have a fever.  You have severe abdominal pain.  You have black, tarry, or bloody stools.   This information is not intended to replace advice given to you by your health care provider. Make sure you discuss any questions you have with your health care provider.   Document Released: 08/21/2012 Document Revised: 09/25/2014 Document Reviewed: 08/21/2012 Elsevier Interactive Patient Education 2016 Elsevier Inc.   Moderate Conscious  Sedation, Adult, Care After Refer to this sheet in the next few weeks. These instructions provide you with information on caring for yourself after your procedure. Your health care provider may also give you more specific instructions. Your treatment has been planned according to current medical practices, but problems sometimes occur. Call your health care provider if you have any problems or questions after your procedure. WHAT TO EXPECT AFTER THE PROCEDURE  After your procedure:  You may feel sleepy, clumsy, and have poor balance for several hours.  Vomiting may occur if you eat too soon after the procedure. HOME CARE INSTRUCTIONS  Do not participate in any activities where you could become injured for at least 24 hours. Do not:  Drive.  Swim.  Ride a bicycle.  Operate heavy machinery.  Cook.  Use power tools.  Climb ladders.  Work from a high place.  Do not make important decisions or sign legal documents until you are improved.  If you vomit, drink water, juice, or soup when you can drink without vomiting. Make sure you have little or no nausea before eating solid foods.  Only take over-the-counter or prescription medicines for pain, discomfort, or fever as directed by your health care provider.  Make sure you and your family fully understand everything about the medicines given to you, including what side effects may occur.  You should not drink alcohol, take sleeping pills, or take medicines that cause drowsiness for at least 24 hours.  If you smoke, do not smoke without supervision.  If you are feeling better, you may resume normal activities 24 hours after you were sedated.  Keep all appointments with your health care provider. SEEK MEDICAL CARE IF:  Your skin is pale or bluish in color.  You continue to feel nauseous or vomit.  Your pain is getting worse and is not helped by medicine.  You have bleeding or swelling.  You are still sleepy or feeling  clumsy after 24 hours. SEEK IMMEDIATE MEDICAL CARE IF:  You develop a rash.  You have difficulty breathing.  You develop any type of allergic problem.  You have a fever. MAKE SURE YOU:  Understand these instructions.  Will watch your condition.  Will get help right away if you are not doing well or get worse.   This information is not intended to replace advice given to you by your health care provider. Make sure you discuss any questions you have with your health care provider.   Document Released: 06/25/2013 Document Revised: 09/25/2014 Document Reviewed: 06/25/2013 Elsevier Interactive Patient Education [employer].

## 2016-01-05 NOTE — Telephone Encounter (Signed)
Per Dr.Rehman the patient will need to have labs drawn in 3 weeks.

## 2016-01-05 NOTE — H&P (Signed)
Maria [name] is an 60 y.o. female.   Chief Complaint: Patient is here for EGD and possible esophageal variceal banding. 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 Last exam was 6 months ago and she did not or banding. For 2 weeks ago she was hospitalized at [facility], [place] for left leg cellulitis. She had lab studies on 12/23/2015. H&H was 9.3 and 31.2 and platelet count was 37,000. Bilirubin was 1.7, AP 89, AST 36.3, ALT 23 and albumin was 3.83.  Past Medical History  Diagnosis Date  . Cirrhosis ([place])   . NAFLD (nonalcoholic fatty liver disease)   . Esophageal bleed, non-variceal   . Hypertension   . Hypothyroidism   . Fibromyalgia   . Osteoarthrosis   . Diabetes mellitus   . UTI (urinary tract infection) 11/12 end of the month    Patient feels that she passed a kidney stone at that time  . PONV (postoperative nausea and vomiting)   . Anemia   . Eye hemorrhage     Behind left eye  . Osteoporosis     Past Surgical History  Procedure Laterality Date  . Esophagogastroduodenoscopy w/ banding  08/2010  . Appendectomy  1980  . Tonsillectomy    . Bilateral salpingoophorectomy    . Vaginal hysterectomy    . Colonoscopy  03/15/2011  . Upper gastrointestinal endoscopy  03/15/2011    EGD ED BANDING/TCS  . Upper gastrointestinal endoscopy  09/05/2010  . Upper gastrointestinal endoscopy  08/11/2010  . Esophagogastroduodenoscopy  04/24/2012    Procedure: ESOPHAGOGASTRODUODENOSCOPY (EGD);  Surgeon: [name], MD;  [facility];  Service: Endoscopy;  Laterality: N/A;  300  . Esophageal banding  04/24/2012    Procedure: ESOPHAGEAL BANDING;  Surgeon: [name], MD;  [facility];  Service: Endoscopy;  Laterality: N/A;  . Esophageal banding  08/03/2012    [facility] in [place] , [place]  .  Esophagogastroduodenoscopy (egd) with propofol  09/24/2012    Procedure: ESOPHAGOGASTRODUODENOSCOPY (EGD) WITH PROPOFOL;  Surgeon: [name], MD;  Location: [facility];  Service: Endoscopy;  Laterality: N/A;  GE junction at 36  . Esophageal banding  09/24/2012    Procedure: ESOPHAGEAL BANDING;  Surgeon: [name], MD;  Location: [facility];  Service: Endoscopy;  Laterality: N/A;  Banding x 3  . Esophagogastroduodenoscopy N/A 01/29/2013    Procedure: ESOPHAGOGASTRODUODENOSCOPY (EGD);  Surgeon: [name], MD;  [facility];  Service: Endoscopy;  Laterality: N/A;  1200  . Esophageal banding N/A 01/29/2013    Procedure: ESOPHAGEAL BANDING;  Surgeon: [name], MD;  [facility];  Service: Endoscopy;  Laterality: N/A;  . Cholecystectomy    . Esophagogastroduodenoscopy N/A 05/22/2013    Procedure: ESOPHAGOGASTRODUODENOSCOPY (EGD);  Surgeon: [name], MD;  [facility];  Service: Endoscopy;  Laterality: N/A;  1:55  . Esophagogastroduodenoscopy N/A 12/31/2013    Procedure: ESOPHAGOGASTRODUODENOSCOPY (EGD);  Surgeon: [name], MD;  [facility];  Service: Endoscopy;  Laterality: N/A;  1200  . Esophagogastroduodenoscopy N/A 06/19/2014    Procedure: ESOPHAGOGASTRODUODENOSCOPY (EGD);  Surgeon: [name], MD;  [facility];  Service: Endoscopy;  Laterality: N/A;  1055  . Esophageal banding N/A 06/19/2014    Procedure: ESOPHAGEAL BANDING;  Surgeon: [name], MD;  [facility];  Service: Endoscopy;  Laterality: N/A;  . Esophagogastroduodenoscopy N/A 01/15/2015    Procedure: ESOPHAGOGASTRODUODENOSCOPY (EGD);  Surgeon: [name], MD;  [facility];  Service: Endoscopy;  Laterality: N/A;  730 - moved to 9:45 - moved to 1250-Ann notified pt  . Esophagogastroduodenoscopy N/A 06/30/2015    Procedure: ESOPHAGOGASTRODUODENOSCOPY (EGD);  Surgeon: [name], MD;  [facility];  Service:  Endoscopy;  Laterality: N/A;  1200    Family History  Problem Relation Age of Onset  . Lung cancer Mother   . Diabetes Father   . Diabetes Sister   . Hypertension Sister   . Hypothyroidism Sister   . Colon cancer Brother   . Diabetes Sister   . Hypothyroidism Brother   . Healthy Daughter   . Obesity Daughter   . Hypertension Daughter    Social History:  reports that she has never smoked. She has never used smokeless tobacco. She reports that she does not drink alcohol or use illicit drugs.  Allergies: No Known Allergies  Medications Prior to Admission  Medication Sig Dispense Refill  . amoxicillin-clavulanate (AUGMENTIN) 500-125 MG tablet Take 1 tablet by mouth 2 (two) times daily.    . cholecalciferol (VITAMIN D) 1000 UNITS tablet Take 1,000 Units by mouth daily.    . ferrous sulfate 325 (65 FE) MG tablet Take 325 mg by mouth as needed. Reported on 10/05/2015    . furosemide (LASIX) 20 MG tablet Take 1 tablet (20 mg total) by mouth daily as needed. (Patient taking differently: Take 20 mg by mouth daily as needed for fluid. ) 90 tablet 3  . insulin lispro (HUMALOG) 100 UNIT/ML injection Inject into the skin 3 (three) times daily before meals. Rate of 5 units/hour except smaller dose at night per patient. Patient may bolus as needed.    Marland Kitchen levothyroxine (SYNTHROID, LEVOTHROID) 25 MCG tablet Take 25 mcg by mouth daily.      . magnesium oxide (MAG-OX) 400 MG tablet Take 400 mg by mouth daily.    Marland Kitchen MILK THISTLE PO Take 450 mg by mouth daily.     . pantoprazole (PROTONIX) 40 MG tablet TAKE 1 BY MOUTH DAILY 90 tablet 3  . PARoxetine (PAXIL) 40 MG tablet Take 40 mg by mouth every morning.    . potassium chloride SA (K-DUR,KLOR-CON) 20 MEQ tablet Take 1 tablet (20 mEq total) by mouth daily as needed. Takes when she takes lasix 90 tablet 3  . propranolol (INDERAL) 20 MG tablet Take 20 mg by mouth daily. MAY TAKE 2 TIMES DAILY    . vitamin E 400 UNIT capsule Take 400 Units by mouth daily.       Results for orders placed or performed during the hospital encounter of 01/05/16 (from the past 48 hour(s))  Glucose, capillary     Status: Abnormal   Collection Time: 01/05/16  8:12 AM  Result Value Ref Range   Glucose-Capillary 116 (H) 65 - 99 mg/dL   No results found.  ROS  Blood pressure 143/59, pulse 67, temperature 98.9 F (37.2 C), temperature source Oral, resp. rate 20, height 5' 4"  (1.626 m), weight 238 lb (107.956 kg), SpO2 96 %. Physical Exam  Constitutional: She appears well-developed and well-nourished.  HENT:  Mouth/Throat: Oropharynx is clear and moist.  Eyes: Conjunctivae are normal. No scleral icterus.  Neck: No thyromegaly present.  Cardiovascular: Normal rate, regular rhythm and normal heart sounds.   No murmur heard. Respiratory: Effort normal and  breath sounds normal.  GI: Soft. She exhibits no distension and no mass. There is no tenderness.  Musculoskeletal: She exhibits edema (1+ pitting edema involving both legs. Is slightly more on the left side.).  Lymphadenopathy:    She has no cervical adenopathy.  Neurological: She is alert.  Skin: Skin is warm and dry.     Assessment/Plan History of esophageal variceal bleed. Cirrhosis secondary to NASH. EGD to treat recurrent esophageal varices.  [name], MD 01/05/2016, 8:33 AM

## 2016-01-06 ENCOUNTER — Encounter (HOSPITAL_COMMUNITY): Payer: Self-pay | Admitting: Internal Medicine

## 2016-01-12 ENCOUNTER — Encounter (INDEPENDENT_AMBULATORY_CARE_PROVIDER_SITE_OTHER): Payer: Self-pay | Admitting: *Deleted

## 2016-01-12 ENCOUNTER — Other Ambulatory Visit (INDEPENDENT_AMBULATORY_CARE_PROVIDER_SITE_OTHER): Payer: Self-pay | Admitting: *Deleted

## 2016-01-12 DIAGNOSIS — D649 Anemia, unspecified: Secondary | ICD-10-CM

## 2016-01-25 ENCOUNTER — Encounter (INDEPENDENT_AMBULATORY_CARE_PROVIDER_SITE_OTHER): Payer: Self-pay

## 2016-02-07 ENCOUNTER — Telehealth (INDEPENDENT_AMBULATORY_CARE_PROVIDER_SITE_OTHER): Payer: Self-pay | Admitting: Internal Medicine

## 2016-02-07 DIAGNOSIS — R197 Diarrhea, unspecified: Secondary | ICD-10-CM

## 2016-02-07 NOTE — Telephone Encounter (Signed)
Talked with the patient. She states that she has had horrible diarrhea since last Wednesday - Saturday. Sunday was a good day. She has started having diarrhea again this morning. She describes it as loose to watery and a very foul odor. She was on antibiotics about 1 month ago ,this was related to her lag. Per Dr.Rehman the patient may get C-diff -stool, then she may take the Imodium PO for diarrhea.

## 2016-02-07 NOTE — Telephone Encounter (Signed)
Patient called, was scheduled for labs and was sick and didn't go.  Instead of going to [facility] here in [place], she'd like to go to [facility] in [place] across from Dr. Woody Seller office.  As of this morning she hasn't had anything to eat or drink yet.  Wants to know if the orders can be sent to [facility].  [phone]

## 2016-02-15 ENCOUNTER — Telehealth (INDEPENDENT_AMBULATORY_CARE_PROVIDER_SITE_OTHER): Payer: Self-pay | Admitting: Internal Medicine

## 2016-02-15 NOTE — Telephone Encounter (Signed)
Patient called, left a message, she'd like a call back with results.  [phone]

## 2016-02-17 NOTE — Telephone Encounter (Signed)
Results have been rec'd. Dr.Rehman will call the patient when he reviews them.

## 2016-02-25 ENCOUNTER — Telehealth (INDEPENDENT_AMBULATORY_CARE_PROVIDER_SITE_OTHER): Payer: Self-pay | Admitting: Internal Medicine

## 2016-02-25 ENCOUNTER — Encounter (INDEPENDENT_AMBULATORY_CARE_PROVIDER_SITE_OTHER): Payer: Self-pay

## 2016-02-25 DIAGNOSIS — D649 Anemia, unspecified: Secondary | ICD-10-CM

## 2016-02-25 DIAGNOSIS — E611 Iron deficiency: Secondary | ICD-10-CM

## 2016-02-25 NOTE — Telephone Encounter (Signed)
Maria [name]- patient had thought she was gong to have a double in April. But felt that it was due to her to her new Insurance and they would not cover it.Marland Kitchen She was told by Karna Christmas , that it would be done in June. I told her that you would call her , that it may not be today but next week, to review this with her.

## 2016-02-25 NOTE — Telephone Encounter (Signed)
Patient called, stated that she received a call yesterday, but no message was left.  She thinks maybe is was in regards to results.  [phone]

## 2016-02-25 NOTE — Telephone Encounter (Signed)
Patient was called and given her results. Patient is to take her Iron as directed, we will repeat her labs in (H&H) 1 month. This is per Dr.Rehman.

## 2016-02-29 ENCOUNTER — Encounter (INDEPENDENT_AMBULATORY_CARE_PROVIDER_SITE_OTHER): Payer: Self-pay | Admitting: *Deleted

## 2016-02-29 ENCOUNTER — Telehealth (INDEPENDENT_AMBULATORY_CARE_PROVIDER_SITE_OTHER): Payer: Self-pay | Admitting: *Deleted

## 2016-02-29 ENCOUNTER — Other Ambulatory Visit (INDEPENDENT_AMBULATORY_CARE_PROVIDER_SITE_OTHER): Payer: Self-pay | Admitting: *Deleted

## 2016-02-29 NOTE — Telephone Encounter (Signed)
Patient's TCS is due in July and is sch'd 03/24/16 and her EGD is due in Oct 2017, patient aware

## 2016-02-29 NOTE — Telephone Encounter (Signed)
Patient needs trilyte 

## 2016-02-29 NOTE — Telephone Encounter (Signed)
Referring MD/PCP: vyas   Procedure: tcs  Reason/Indication:  Hx polyps, fam hx colon ca  Has patient had this procedure before?  Yes, 2012 -- epic  If so, when, by whom and where?    Is there a family history of colon cancer?  Yes, brother  Who?  What age when diagnosed?    Is patient diabetic?   yes      Does patient have prosthetic heart valve or mechanical valve?  no  Do you have a pacemaker?  no  Has patient ever had endocarditis? no  Has patient had joint replacement within last 12 months?  no  Does patient tend to be constipated or take laxatives? no  Does patient have a history of alcohol/drug use?  no  Is patient on Coumadin, Plavix and/or Aspirin? no  Medications: see epic  Allergies: nkda  Medication Adjustment: iron & Vit E 10 days before, don't take DM med evening before or morning of  Procedure date & time: 03/24/16 at 825

## 2016-03-01 ENCOUNTER — Other Ambulatory Visit (INDEPENDENT_AMBULATORY_CARE_PROVIDER_SITE_OTHER): Payer: Self-pay | Admitting: *Deleted

## 2016-03-01 DIAGNOSIS — Z800 Family history of malignant neoplasm of digestive organs: Secondary | ICD-10-CM

## 2016-03-01 DIAGNOSIS — Z86010 Personal history of colonic polyps: Secondary | ICD-10-CM

## 2016-03-01 MED ORDER — PEG 3350-KCL-NA BICARB-NACL 420 G PO SOLR: 4000.0000 mL | Freq: Once | ORAL | Status: DC

## 2016-03-01 NOTE — Telephone Encounter (Signed)
agree

## 2016-03-07 ENCOUNTER — Encounter (INDEPENDENT_AMBULATORY_CARE_PROVIDER_SITE_OTHER): Payer: Self-pay | Admitting: Internal Medicine

## 2016-03-09 ENCOUNTER — Other Ambulatory Visit (INDEPENDENT_AMBULATORY_CARE_PROVIDER_SITE_OTHER): Payer: Self-pay | Admitting: *Deleted

## 2016-03-09 ENCOUNTER — Encounter (INDEPENDENT_AMBULATORY_CARE_PROVIDER_SITE_OTHER): Payer: Self-pay | Admitting: *Deleted

## 2016-03-09 DIAGNOSIS — D649 Anemia, unspecified: Secondary | ICD-10-CM

## 2016-03-24 ENCOUNTER — Encounter (HOSPITAL_COMMUNITY)
Admission: RE | Disposition: A | Discharge status: Home / Self Care | Payer: Self-pay | Source: Ambulatory Visit | Attending: Internal Medicine

## 2016-03-24 ENCOUNTER — Encounter (HOSPITAL_COMMUNITY): Payer: Self-pay

## 2016-03-24 ENCOUNTER — Ambulatory Visit (HOSPITAL_COMMUNITY)
Admission: RE | Admit: 2016-03-24 | Discharge: 2016-03-24 | Disposition: A | Discharge status: Home / Self Care | Payer: Medicare Other | Source: Ambulatory Visit | Attending: Internal Medicine | Admitting: Internal Medicine

## 2016-03-24 DIAGNOSIS — M810 Age-related osteoporosis without current pathological fracture: Secondary | ICD-10-CM | POA: Insufficient documentation

## 2016-03-24 DIAGNOSIS — Z794 Long term (current) use of insulin: Secondary | ICD-10-CM | POA: Diagnosis not present

## 2016-03-24 DIAGNOSIS — K760 Fatty (change of) liver, not elsewhere classified: Secondary | ICD-10-CM | POA: Diagnosis not present

## 2016-03-24 DIAGNOSIS — M797 Fibromyalgia: Secondary | ICD-10-CM | POA: Diagnosis not present

## 2016-03-24 DIAGNOSIS — Z1211 Encounter for screening for malignant neoplasm of colon: Secondary | ICD-10-CM | POA: Insufficient documentation

## 2016-03-24 DIAGNOSIS — Z9049 Acquired absence of other specified parts of digestive tract: Secondary | ICD-10-CM | POA: Insufficient documentation

## 2016-03-24 DIAGNOSIS — Z09 Encounter for follow-up examination after completed treatment for conditions other than malignant neoplasm: Secondary | ICD-10-CM | POA: Diagnosis not present

## 2016-03-24 DIAGNOSIS — Z801 Family history of malignant neoplasm of trachea, bronchus and lung: Secondary | ICD-10-CM | POA: Insufficient documentation

## 2016-03-24 DIAGNOSIS — K644 Residual hemorrhoidal skin tags: Secondary | ICD-10-CM | POA: Insufficient documentation

## 2016-03-24 DIAGNOSIS — Z87442 Personal history of urinary calculi: Secondary | ICD-10-CM | POA: Diagnosis not present

## 2016-03-24 DIAGNOSIS — Z90710 Acquired absence of both cervix and uterus: Secondary | ICD-10-CM | POA: Diagnosis not present

## 2016-03-24 DIAGNOSIS — Z860100 Personal history of colon polyps, unspecified: Secondary | ICD-10-CM

## 2016-03-24 DIAGNOSIS — E119 Type 2 diabetes mellitus without complications: Secondary | ICD-10-CM | POA: Insufficient documentation

## 2016-03-24 DIAGNOSIS — Z8349 Family history of other endocrine, nutritional and metabolic diseases: Secondary | ICD-10-CM | POA: Diagnosis not present

## 2016-03-24 DIAGNOSIS — D125 Benign neoplasm of sigmoid colon: Secondary | ICD-10-CM | POA: Insufficient documentation

## 2016-03-24 DIAGNOSIS — E039 Hypothyroidism, unspecified: Secondary | ICD-10-CM | POA: Insufficient documentation

## 2016-03-24 DIAGNOSIS — K5520 Angiodysplasia of colon without hemorrhage: Secondary | ICD-10-CM | POA: Diagnosis not present

## 2016-03-24 DIAGNOSIS — Z800 Family history of malignant neoplasm of digestive organs: Secondary | ICD-10-CM

## 2016-03-24 DIAGNOSIS — K7460 Unspecified cirrhosis of liver: Secondary | ICD-10-CM | POA: Diagnosis not present

## 2016-03-24 DIAGNOSIS — I10 Essential (primary) hypertension: Secondary | ICD-10-CM | POA: Insufficient documentation

## 2016-03-24 DIAGNOSIS — Z79899 Other long term (current) drug therapy: Secondary | ICD-10-CM | POA: Diagnosis not present

## 2016-03-24 DIAGNOSIS — D6489 Other specified anemias: Secondary | ICD-10-CM | POA: Insufficient documentation

## 2016-03-24 DIAGNOSIS — Z8249 Family history of ischemic heart disease and other diseases of the circulatory system: Secondary | ICD-10-CM | POA: Insufficient documentation

## 2016-03-24 DIAGNOSIS — Z833 Family history of diabetes mellitus: Secondary | ICD-10-CM | POA: Insufficient documentation

## 2016-03-24 HISTORY — PX: POLYPECTOMY: SHX5525

## 2016-03-24 HISTORY — PX: COLONOSCOPY: SHX5424

## 2016-03-24 LAB — GLUCOSE, CAPILLARY: Glucose-Capillary: 163 mg/dL — ABNORMAL HIGH (ref 65–99)

## 2016-03-24 SURGERY — COLONOSCOPY
Anesthesia: Moderate Sedation

## 2016-03-24 MED ORDER — SODIUM CHLORIDE 0.9 % IV SOLN
INTRAVENOUS | Status: DC
  Administered 2016-03-24: 09:00:00 via INTRAVENOUS

## 2016-03-24 MED ORDER — MIDAZOLAM HCL 5 MG/5ML IJ SOLN
INTRAMUSCULAR | Status: DC
Start: 2016-03-24 — End: 2016-03-24
  Filled 2016-03-24: qty 10

## 2016-03-24 MED ORDER — MEPERIDINE HCL 50 MG/ML IJ SOLN
INTRAMUSCULAR | Status: DC | PRN
  Administered 2016-03-24 (×2): 25 mg via INTRAVENOUS

## 2016-03-24 MED ORDER — MEPERIDINE HCL 50 MG/ML IJ SOLN
INTRAMUSCULAR | Status: AC
  Filled 2016-03-24: qty 1

## 2016-03-24 MED ORDER — STERILE WATER FOR IRRIGATION IR SOLN
Status: DC | PRN
  Administered 2016-03-24: 09:00:00

## 2016-03-24 MED ORDER — MIDAZOLAM HCL 5 MG/5ML IJ SOLN
INTRAMUSCULAR | Status: DC | PRN
  Administered 2016-03-24 (×5): 2 mg via INTRAVENOUS

## 2016-03-24 NOTE — Discharge Instructions (Signed)
Resume usual medications including ferrous sulfate. Resume usual diet. No driving for 24 hours. Physician will call with biopsy results. Virtual colonoscopy to be scheduled in one month. Office will call.      Colonoscopy, Care After These instructions give you information on caring for yourself after your procedure. Your doctor may also give you more specific instructions. Call your doctor if you have any problems or questions after your procedure. HOME CARE  Do not drive for 24 hours.  Do not sign important papers or use machinery for 24 hours.  You may shower.  You may go back to your usual activities, but go slower for the first 24 hours.  Take rest breaks often during the first 24 hours.  Walk around or use warm packs on your belly (abdomen) if you have belly cramping or gas.  Drink enough fluids to keep your pee (urine) clear or pale yellow.  Resume your normal diet. Avoid heavy or fried foods.  Avoid drinking alcohol for 24 hours or as told by your doctor.  Only take medicines as told by your doctor. If a tissue sample (biopsy) was taken during the procedure:   Do not take aspirin or blood thinners for 7 days, or as told by your doctor.  Do not drink alcohol for 7 days, or as told by your doctor.  Eat soft foods for the first 24 hours. GET HELP IF: You still have a small amount of blood in your poop (stool) 2-3 days after the procedure. GET HELP RIGHT AWAY IF:  You have more than a small amount of blood in your poop.  You see clumps of tissue (blood clots) in your poop.  Your belly is puffy (swollen).  You feel sick to your stomach (nauseous) or throw up (vomit).  You have a fever.  You have belly pain that gets worse and medicine does not help. MAKE SURE YOU:  Understand these instructions.  Will watch your condition.  Will get help right away if you are not doing well or get worse.   This information is not intended to replace advice given to  you by your health care provider. Make sure you discuss any questions you have with your health care provider.   Document Released: 10/07/2010 Document Revised: 09/09/2013 Document Reviewed: 05/12/2013 Elsevier Interactive Patient Education 2016 Elsevier Inc.  Colon Polyps Polyps are lumps of extra tissue growing inside the body. Polyps can grow in the large intestine (colon). Most colon polyps are noncancerous (benign). However, some colon polyps can become cancerous over time. Polyps that are larger than a pea may be harmful. To be safe, caregivers remove and test all polyps. CAUSES  Polyps form when mutations in the genes cause your cells to grow and divide even though no more tissue is needed. RISK FACTORS There are a number of risk factors that can increase your chances of getting colon polyps. They include:  Being older than 50 years.  Family history of colon polyps or colon cancer.  Long-term colon diseases, such as colitis or Crohn disease.  Being overweight.  Smoking.  Being inactive.  Drinking too much alcohol. SYMPTOMS  Most small polyps do not cause symptoms. If symptoms are present, they may include:  Blood in the stool. The stool may look dark red or black.  Constipation or diarrhea that lasts longer than 1 week. DIAGNOSIS People often do not know they have polyps until their caregiver finds them during a regular checkup. Your caregiver can use 4 tests  to check for polyps:  Digital rectal exam. The caregiver wears gloves and feels inside the rectum. This test would find polyps only in the rectum.  Barium enema. The caregiver puts a liquid called barium into your rectum before taking X-rays of your colon. Barium makes your colon look white. Polyps are dark, so they are easy to see in the X-ray pictures.  Sigmoidoscopy. A thin, flexible tube (sigmoidoscope) is placed into your rectum. The sigmoidoscope has a light and tiny camera in it. The caregiver uses the  sigmoidoscope to look at the last third of your colon.  Colonoscopy. This test is like sigmoidoscopy, but the caregiver looks at the entire colon. This is the most common method for finding and removing polyps. TREATMENT  Any polyps will be removed during a sigmoidoscopy or colonoscopy. The polyps are then tested for cancer. PREVENTION  To help lower your risk of getting more colon polyps:  Eat plenty of fruits and vegetables. Avoid eating fatty foods.  Do not smoke.  Avoid drinking alcohol.  Exercise every day.  Lose weight if recommended by your caregiver.  Eat plenty of calcium and folate. Foods that are rich in calcium include milk, cheese, and broccoli. Foods that are rich in folate include chickpeas, kidney beans, and spinach. HOME CARE INSTRUCTIONS Keep all follow-up appointments as directed by your caregiver. You may need periodic exams to check for polyps. SEEK MEDICAL CARE IF: You notice bleeding during a bowel movement.   This information is not intended to replace advice given to you by your health care provider. Make sure you discuss any questions you have with your health care provider.   Document Released: 05/31/2004 Document Revised: 09/25/2014 Document Reviewed: 11/14/2011 Elsevier Interactive Patient Education [employer].

## 2016-03-24 NOTE — Op Note (Signed)
[facility] Patient Name: Maria [name] Procedure Date: 03/24/2016 9:05 AM MRN: [id number] Date of Birth: 10-29-1955 Attending MD: Hildred Laser , MD CSN: [id number] Age: 60 Admit Type: Outpatient Procedure:                Colonoscopy Indications:              Family history of colon cancer in a first-degree                            relative, Family history of colon cancer in                            multiple second-degree relatives Providers:                Hildred Laser, MD, Otis Peak B. Sharon Seller, RN, Shelby Mattocks, Technician Referring MD:             Monico Blitz, MD Medicines:                Meperidine 50 mg IV, Midazolam 10 mg IV Complications:            No immediate complications. Estimated Blood Loss:     Estimated blood loss was minimal. Procedure:                Pre-Anesthesia Assessment:                           - Prior to the procedure, a History and Physical                            was performed, and patient medications and                            allergies were reviewed. The patient's tolerance of                            previous anesthesia was also reviewed. The risks                            and benefits of the procedure and the sedation                            options and risks were discussed with the patient.                            All questions were answered, and informed consent                            was obtained. Prior Anticoagulants: The patient has                            taken no previous anticoagulant or antiplatelet  agents. ASA Grade Assessment: III - A patient with                            severe systemic disease. After reviewing the risks                            and benefits, the patient was deemed in                            satisfactory condition to undergo the procedure.                           After obtaining informed consent, the colonoscope                  was passed under direct vision. Throughout the                            procedure, the patient's blood pressure, pulse, and                            oxygen saturations were monitored continuously. The                            [id number] ([id number]) scope was introduced through                            the anus and advanced to the the ascending colon.                            The colonoscopy was technically difficult and                            complex due to significant looping. Successful                            completion of the procedure was aided by increasing                            the dose of sedation medication, withdrawing and                            reinserting the scope and colowrap. The patient                            tolerated the procedure well. The quality of the                            bowel preparation was adequate. The rectum was                            photographed. Scope In: 9:22:37 AM Scope Out: 10:11:07 AM Total Procedure Duration: 0 hours 48 minutes 30 seconds  Findings:      A few small angiodysplastic lesions without bleeding were  found in the       sigmoid colon, in the descending colon and in the transverse colon.      A 4 mm polyp was found in the sigmoid colon. The polyp was sessile. The       polyp was removed with a cold snare. Resection and retrieval were       complete.      External hemorrhoids were found during retroflexion. The hemorrhoids       were small. Impression:               - A few non-bleeding colonic angiodysplastic                            lesions.                           - One 4 mm polyp in the sigmoid colon, removed with                            a cold snare. Resected and retrieved.                           - External hemorrhoids. Moderate Sedation:      Moderate (conscious) sedation was administered by the endoscopy nurse       and supervised by the endoscopist. The following  parameters were       monitored: oxygen saturation, heart rate, blood pressure, CO2       capnography and response to care. Total physician intraservice time was       56 minutes. Recommendation:           - Patient has a contact number available for                            emergencies. The signs and symptoms of potential                            delayed complications were discussed with the                            patient. Return to normal activities tomorrow.                            Written discharge instructions were provided to the                            patient.                           - Resume previous diet today.                           - Continue present medications.                           - Await pathology results.                           -  Perform a virtual colonoscopy in 4 weeks. Procedure Code(s):        --- Professional ---                           [phone], 52, Colonoscopy, flexible; with removal of                            tumor(s), polyp(s), or other lesion(s) by snare                            technique                           99152, Moderate sedation services provided by the                            same physician or other qualified health care                            professional performing the diagnostic or                            therapeutic service that the sedation supports,                            requiring the presence of an independent trained                            observer to assist in the monitoring of the                            patient's level of consciousness and physiological                            status; initial 15 minutes of intraservice time,                            patient age 54 years or older                           [phone], Moderate sedation services; each additional                            15 minutes intraservice time                           99153, Moderate sedation services; each additional                             15 minutes intraservice time                           99153, Moderate sedation services; each additional  15 minutes intraservice time Diagnosis Code(s):        --- Professional ---                           K55.20, Angiodysplasia of colon without hemorrhage                           D12.5, Benign neoplasm of sigmoid colon                           K64.4, Residual hemorrhoidal skin tags                           Z80.0, Family history of malignant neoplasm of                            digestive organs CPT copyright 2016 American Medical Association. All rights reserved. The codes documented in this report are preliminary and upon coder review may  be revised to meet current compliance requirements. Hildred Laser, MD Hildred Laser, MD 03/24/2016 10:23:06 AM This report has been signed electronically. Number of Addenda: 0

## 2016-03-24 NOTE — H&P (Signed)
Maria [name] is an 60 y.o. female.   Chief Complaint: Patient is here for colonoscopy. HPI: 60 year old Caucasian female who is in for surveillance colonoscopy. She has history of colonic polyps and family history of CRC and brother and 2 second-degree relatives. Last colonoscopy was in June 2012 with removal of 3 polyps into her adenomas. She denies frank rectal bleeding abdominal pain. She has occasional hematochezia in the form of blood on the tissue.  Past Medical History  Diagnosis Date  . Cirrhosis ([place])   . NAFLD (nonalcoholic fatty liver disease)   . Esophageal bleed, non-variceal   . Hypertension   . Hypothyroidism   . Fibromyalgia   . Osteoarthrosis   . Diabetes mellitus   . UTI (urinary tract infection) 11/12 end of the month    Patient feels that she passed a kidney stone at that time  . PONV (postoperative nausea and vomiting)   . Anemia   . Eye hemorrhage     Behind left eye  . Osteoporosis     Past Surgical History  Procedure Laterality Date  . Esophagogastroduodenoscopy w/ banding  08/2010  . Appendectomy  1980  . Tonsillectomy    . Bilateral salpingoophorectomy    . Vaginal hysterectomy    . Colonoscopy  03/15/2011  . Upper gastrointestinal endoscopy  03/15/2011    EGD ED BANDING/TCS  . Upper gastrointestinal endoscopy  09/05/2010  . Upper gastrointestinal endoscopy  08/11/2010  . Esophagogastroduodenoscopy  04/24/2012    Procedure: ESOPHAGOGASTRODUODENOSCOPY (EGD);  Surgeon: [name], MD;  [facility];  Service: Endoscopy;  Laterality: N/A;  300  . Esophageal banding  04/24/2012    Procedure: ESOPHAGEAL BANDING;  Surgeon: [name], MD;  [facility];  Service: Endoscopy;  Laterality: N/A;  . Esophageal banding  08/03/2012    [facility] in [place] , [place]  . Esophagogastroduodenoscopy (egd) with propofol  09/24/2012    Procedure: ESOPHAGOGASTRODUODENOSCOPY (EGD) WITH PROPOFOL;  Surgeon: [name], MD;   Location: [facility];  Service: Endoscopy;  Laterality: N/A;  GE junction at 36  . Esophageal banding  09/24/2012    Procedure: ESOPHAGEAL BANDING;  Surgeon: [name], MD;  Location: [facility];  Service: Endoscopy;  Laterality: N/A;  Banding x 3  . Esophagogastroduodenoscopy N/A 01/29/2013    Procedure: ESOPHAGOGASTRODUODENOSCOPY (EGD);  Surgeon: [name], MD;  [facility];  Service: Endoscopy;  Laterality: N/A;  1200  . Esophageal banding N/A 01/29/2013    Procedure: ESOPHAGEAL BANDING;  Surgeon: [name], MD;  [facility];  Service: Endoscopy;  Laterality: N/A;  . Cholecystectomy    . Esophagogastroduodenoscopy N/A 05/22/2013    Procedure: ESOPHAGOGASTRODUODENOSCOPY (EGD);  Surgeon: [name], MD;  [facility];  Service: Endoscopy;  Laterality: N/A;  1:55  . Esophagogastroduodenoscopy N/A 12/31/2013    Procedure: ESOPHAGOGASTRODUODENOSCOPY (EGD);  Surgeon: [name], MD;  [facility];  Service: Endoscopy;  Laterality: N/A;  1200  . Esophagogastroduodenoscopy N/A 06/19/2014    Procedure: ESOPHAGOGASTRODUODENOSCOPY (EGD);  Surgeon: [name], MD;  [facility];  Service: Endoscopy;  Laterality: N/A;  1055  . Esophageal banding N/A 06/19/2014    Procedure: ESOPHAGEAL BANDING;  Surgeon: [name], MD;  [facility];  Service: Endoscopy;  Laterality: N/A;  . Esophagogastroduodenoscopy N/A 01/15/2015    Procedure: ESOPHAGOGASTRODUODENOSCOPY (EGD);  Surgeon: [name], MD;  [facility];  Service:  Endoscopy;  Laterality: N/A;  730 - moved to 9:45 - moved to 1250-Ann notified pt  . Esophagogastroduodenoscopy N/A 06/30/2015    Procedure: ESOPHAGOGASTRODUODENOSCOPY (EGD);  Surgeon: [name], MD;  [facility];  Service: Endoscopy;  Laterality: N/A;  1200  . Esophagogastroduodenoscopy N/A 01/05/2016    Procedure: ESOPHAGOGASTRODUODENOSCOPY (EGD);  Surgeon: [name],  MD;  [facility];  Service: Endoscopy;  Laterality: N/A;  830  . Esophageal banding N/A 01/05/2016    Procedure: ESOPHAGEAL BANDING;  Surgeon: [name], MD;  [facility];  Service: Endoscopy;  Laterality: N/A;    Family History  Problem Relation Age of Onset  . Lung cancer Mother   . Diabetes Father   . Diabetes Sister   . Hypertension Sister   . Hypothyroidism Sister   . Colon cancer Brother   . Diabetes Sister   . Hypothyroidism Brother   . Healthy Daughter   . Obesity Daughter   . Hypertension Daughter    Social History:  reports that she has never smoked. She has never used smokeless tobacco. She reports that she does not drink alcohol or use illicit drugs.  Allergies: No Known Allergies  Medications Prior to Admission  Medication Sig Dispense Refill  . cholecalciferol (VITAMIN D) 1000 UNITS tablet Take 1,000 Units by mouth daily.    . ferrous sulfate 325 (65 FE) MG tablet Take 325 mg by mouth as needed. Reported on 10/05/2015    . furosemide (LASIX) 20 MG tablet Take 1 tablet (20 mg total) by mouth daily as needed. (Patient taking differently: Take 20 mg by mouth daily as needed for fluid. ) 90 tablet 3  . insulin lispro (HUMALOG) 100 UNIT/ML injection Inject into the skin 3 (three) times daily before meals. Rate of 5 units/hour except smaller dose at night per patient. Patient may bolus as needed.    Marland Kitchen levothyroxine (SYNTHROID, LEVOTHROID) 25 MCG tablet Take 25 mcg by mouth daily.      . magnesium oxide (MAG-OX) 400 MG tablet Take 400 mg by mouth daily.    Marland Kitchen MILK THISTLE PO Take 450 mg by mouth daily.     Marland Kitchen PARoxetine (PAXIL) 40 MG tablet Take 40 mg by mouth every morning.    . polyethylene glycol-electrolytes (TRILYTE) 420 g solution Take 4,000 mLs by mouth once. 4000 mL 0  . potassium chloride SA (K-DUR,KLOR-CON) 20 MEQ tablet Take 1 tablet (20 mEq total) by mouth daily as needed. Takes when she takes lasix 90 tablet 3  . propranolol (INDERAL)  20 MG tablet Take 20 mg by mouth daily. MAY TAKE 2 TIMES DAILY    . vitamin E 400 UNIT capsule Take 400 Units by mouth daily.      Results for orders placed or performed during the hospital encounter of 03/24/16 (from the past 48 hour(s))  Glucose, capillary     Status: Abnormal   Collection Time: 03/24/16  8:55 AM  Result Value Ref Range   Glucose-Capillary 163 (H) 65 - 99 mg/dL   No results found.  ROS  Blood pressure 147/56, pulse 68, temperature 98.8 F (37.1 C), temperature source Oral, resp. rate 16, height 5' 5"  (1.651 m), weight 238 lb (107.956 kg), SpO2 99 %. Physical Exam  Constitutional: She appears well-developed and well-nourished.  HENT:  Mouth/Throat: Oropharynx is clear and moist.  Eyes: Conjunctivae are normal. No scleral icterus.  Neck: No thyromegaly present.  Cardiovascular: Normal rate, regular rhythm and normal heart sounds.  No murmur heard. Respiratory: Effort normal and breath sounds normal.  GI:  Abdomen is full soft and nontender without organomegaly or masses.  Musculoskeletal: She exhibits edema (trace edema around ankles.).  Lymphadenopathy:    She has no cervical adenopathy.  Neurological: She is alert.  Skin: Skin is warm and dry.     Assessment/Plan History of colonic adenomas and family history of CRC in first and second-degree relatives. Surveillance colonoscopy.  Hildred Laser, MD 03/24/2016, 9:10 AM

## 2016-03-30 ENCOUNTER — Encounter (HOSPITAL_COMMUNITY): Payer: Self-pay | Admitting: Internal Medicine

## 2016-04-04 ENCOUNTER — Other Ambulatory Visit (INDEPENDENT_AMBULATORY_CARE_PROVIDER_SITE_OTHER): Payer: Self-pay | Admitting: Internal Medicine

## 2016-04-05 ENCOUNTER — Other Ambulatory Visit (INDEPENDENT_AMBULATORY_CARE_PROVIDER_SITE_OTHER): Payer: Self-pay | Admitting: Internal Medicine

## 2016-04-05 DIAGNOSIS — Z800 Family history of malignant neoplasm of digestive organs: Secondary | ICD-10-CM

## 2016-04-05 DIAGNOSIS — K562 Volvulus: Secondary | ICD-10-CM

## 2016-05-16 ENCOUNTER — Ambulatory Visit
Admission: RE | Admit: 2016-05-16 | Discharge: 2016-05-16 | Disposition: A | Discharge status: Home / Self Care | Payer: Medicare Other | Source: Ambulatory Visit | Attending: Internal Medicine | Admitting: Internal Medicine

## 2016-05-16 DIAGNOSIS — K562 Volvulus: Secondary | ICD-10-CM

## 2016-05-16 DIAGNOSIS — Z800 Family history of malignant neoplasm of digestive organs: Secondary | ICD-10-CM

## 2016-05-19 ENCOUNTER — Encounter (INDEPENDENT_AMBULATORY_CARE_PROVIDER_SITE_OTHER): Payer: Self-pay | Admitting: *Deleted

## 2016-05-19 ENCOUNTER — Other Ambulatory Visit (INDEPENDENT_AMBULATORY_CARE_PROVIDER_SITE_OTHER): Payer: Self-pay | Admitting: *Deleted

## 2016-05-19 DIAGNOSIS — K7460 Unspecified cirrhosis of liver: Secondary | ICD-10-CM | POA: Insufficient documentation

## 2016-05-19 DIAGNOSIS — I8510 Secondary esophageal varices without bleeding: Secondary | ICD-10-CM

## 2016-05-19 DIAGNOSIS — K7469 Other cirrhosis of liver: Secondary | ICD-10-CM

## 2016-06-05 ENCOUNTER — Other Ambulatory Visit (INDEPENDENT_AMBULATORY_CARE_PROVIDER_SITE_OTHER): Payer: Self-pay | Admitting: *Deleted

## 2016-06-05 DIAGNOSIS — D509 Iron deficiency anemia, unspecified: Secondary | ICD-10-CM

## 2016-06-12 ENCOUNTER — Encounter (INDEPENDENT_AMBULATORY_CARE_PROVIDER_SITE_OTHER): Payer: Self-pay | Admitting: *Deleted

## 2016-06-12 ENCOUNTER — Other Ambulatory Visit (INDEPENDENT_AMBULATORY_CARE_PROVIDER_SITE_OTHER): Payer: Self-pay | Admitting: *Deleted

## 2016-06-12 DIAGNOSIS — D509 Iron deficiency anemia, unspecified: Secondary | ICD-10-CM

## 2016-06-13 ENCOUNTER — Encounter (INDEPENDENT_AMBULATORY_CARE_PROVIDER_SITE_OTHER): Payer: Self-pay

## 2016-06-22 ENCOUNTER — Telehealth (INDEPENDENT_AMBULATORY_CARE_PROVIDER_SITE_OTHER): Payer: Self-pay | Admitting: Internal Medicine

## 2016-06-22 NOTE — Telephone Encounter (Signed)
Patient called, wanted to make sure Dr. Laural Golden knew she had her labs done at [facility] and that she got Dr. Woody Seller to order it since they couldn't find the orders.  She just wants to make sure that Dr. Laural Golden gets the results.  [phone]

## 2016-06-22 NOTE — Telephone Encounter (Signed)
We will be looking for those results to come in. The order was faxed numerous times to [facility]. It never went through. Patient called and was there at the Lab, I was in clinic. She was requesting that it be sent then. This message was not rec'd until after in the day by me.

## 2016-06-23 ENCOUNTER — Telehealth (INDEPENDENT_AMBULATORY_CARE_PROVIDER_SITE_OTHER): Payer: Self-pay | Admitting: Internal Medicine

## 2016-06-23 NOTE — Telephone Encounter (Signed)
Patient states she has been taking 1 ferrous sulfate at night. She still has some nausea if she wakes up in the middle of night. She is unable to take this preparation during the daytime because of nausea. Patient will stop ferrous sulfate and start Flintstones with iron chewable 2-3 tablets per day. Check H&H in one month.

## 2016-06-26 ENCOUNTER — Other Ambulatory Visit (INDEPENDENT_AMBULATORY_CARE_PROVIDER_SITE_OTHER): Payer: Self-pay | Admitting: *Deleted

## 2016-06-26 DIAGNOSIS — D508 Other iron deficiency anemias: Secondary | ICD-10-CM

## 2016-06-26 NOTE — Telephone Encounter (Signed)
H&H noted for 1 month. Patient will go to [facility] in [place], [place]/@Drs . Manuella Ghazi Woody Seller.

## 2016-07-12 ENCOUNTER — Telehealth (INDEPENDENT_AMBULATORY_CARE_PROVIDER_SITE_OTHER): Payer: Self-pay | Admitting: *Deleted

## 2016-07-12 NOTE — Telephone Encounter (Signed)
agree

## 2016-07-12 NOTE — Telephone Encounter (Signed)
Referring MD/PCP: vyas   Procedure: egd w/ poss banding  Reason/Indication:  Cirrhosis, esophageal varices  Has patient had this procedure before?  Yes, 12/2015  If so, when, by whom and where?    Is there a family history of colon cancer?    Who?  What age when diagnosed?    Is patient diabetic?   no      Does patient have prosthetic heart valve or mechanical valve?  no  Do you have a pacemaker?  no  Has patient ever had endocarditis? no  Has patient had joint replacement within last 12 months?  no  Does patient tend to be constipated or take laxatives?   Does patient have a history of alcohol/drug use?  no  Is patient on Coumadin, Plavix and/or Aspirin? no  Medications: see epic  Allergies: nkda  Medication Adjustment:   Procedure date & time: 08/03/16 at 930

## 2016-07-13 ENCOUNTER — Other Ambulatory Visit (INDEPENDENT_AMBULATORY_CARE_PROVIDER_SITE_OTHER): Payer: Self-pay | Admitting: *Deleted

## 2016-07-13 ENCOUNTER — Encounter (INDEPENDENT_AMBULATORY_CARE_PROVIDER_SITE_OTHER): Payer: Self-pay | Admitting: *Deleted

## 2016-07-13 DIAGNOSIS — D508 Other iron deficiency anemias: Secondary | ICD-10-CM

## 2016-08-01 LAB — HEMOGLOBIN AND HEMATOCRIT, BLOOD
HCT: 37.2 % (ref 35.0–45.0)
Hemoglobin: 11.2 g/dL — ABNORMAL LOW (ref 11.7–15.5)

## 2016-08-02 ENCOUNTER — Encounter (INDEPENDENT_AMBULATORY_CARE_PROVIDER_SITE_OTHER): Payer: Self-pay | Admitting: *Deleted

## 2016-08-02 ENCOUNTER — Telehealth (INDEPENDENT_AMBULATORY_CARE_PROVIDER_SITE_OTHER): Payer: Self-pay | Admitting: *Deleted

## 2016-08-02 NOTE — Telephone Encounter (Signed)
A letter was sent to the patient for her to call us. We have made several attempts to reach patient by phone. Phone line continually busy,letter sent asking that she call our office.

## 2016-08-03 ENCOUNTER — Encounter (HOSPITAL_COMMUNITY): Payer: Self-pay | Admitting: *Deleted

## 2016-08-03 ENCOUNTER — Ambulatory Visit (HOSPITAL_COMMUNITY)
Admission: RE | Admit: 2016-08-03 | Discharge: 2016-08-03 | Disposition: A | Discharge status: Home Health | Payer: Medicare Other | Source: Ambulatory Visit | Attending: Internal Medicine | Admitting: Internal Medicine

## 2016-08-03 ENCOUNTER — Encounter (HOSPITAL_COMMUNITY)
Admission: RE | Disposition: A | Discharge status: Home Health | Payer: Self-pay | Source: Ambulatory Visit | Attending: Internal Medicine

## 2016-08-03 DIAGNOSIS — E119 Type 2 diabetes mellitus without complications: Secondary | ICD-10-CM | POA: Insufficient documentation

## 2016-08-03 DIAGNOSIS — I10 Essential (primary) hypertension: Secondary | ICD-10-CM | POA: Diagnosis not present

## 2016-08-03 DIAGNOSIS — D649 Anemia, unspecified: Secondary | ICD-10-CM | POA: Insufficient documentation

## 2016-08-03 DIAGNOSIS — Z79899 Other long term (current) drug therapy: Secondary | ICD-10-CM | POA: Diagnosis not present

## 2016-08-03 DIAGNOSIS — K228 Other specified diseases of esophagus: Secondary | ICD-10-CM | POA: Insufficient documentation

## 2016-08-03 DIAGNOSIS — K3189 Other diseases of stomach and duodenum: Secondary | ICD-10-CM | POA: Insufficient documentation

## 2016-08-03 DIAGNOSIS — D696 Thrombocytopenia, unspecified: Secondary | ICD-10-CM | POA: Insufficient documentation

## 2016-08-03 DIAGNOSIS — I8500 Esophageal varices without bleeding: Secondary | ICD-10-CM | POA: Insufficient documentation

## 2016-08-03 DIAGNOSIS — K7581 Nonalcoholic steatohepatitis (NASH): Secondary | ICD-10-CM | POA: Diagnosis not present

## 2016-08-03 DIAGNOSIS — K7469 Other cirrhosis of liver: Secondary | ICD-10-CM

## 2016-08-03 DIAGNOSIS — K31819 Angiodysplasia of stomach and duodenum without bleeding: Secondary | ICD-10-CM | POA: Diagnosis not present

## 2016-08-03 DIAGNOSIS — K7460 Unspecified cirrhosis of liver: Secondary | ICD-10-CM

## 2016-08-03 DIAGNOSIS — K766 Portal hypertension: Secondary | ICD-10-CM | POA: Insufficient documentation

## 2016-08-03 DIAGNOSIS — Z794 Long term (current) use of insulin: Secondary | ICD-10-CM | POA: Insufficient documentation

## 2016-08-03 DIAGNOSIS — E039 Hypothyroidism, unspecified: Secondary | ICD-10-CM | POA: Insufficient documentation

## 2016-08-03 DIAGNOSIS — I8510 Secondary esophageal varices without bleeding: Secondary | ICD-10-CM | POA: Insufficient documentation

## 2016-08-03 HISTORY — PX: ESOPHAGEAL BANDING: SHX5518

## 2016-08-03 HISTORY — PX: ESOPHAGOGASTRODUODENOSCOPY: SHX5428

## 2016-08-03 LAB — GLUCOSE, CAPILLARY: Glucose-Capillary: 155 mg/dL — ABNORMAL HIGH (ref 65–99)

## 2016-08-03 SURGERY — EGD (ESOPHAGOGASTRODUODENOSCOPY)
Anesthesia: Moderate Sedation

## 2016-08-03 MED ORDER — MIDAZOLAM HCL 5 MG/5ML IJ SOLN
INTRAMUSCULAR | Status: DC | PRN
  Administered 2016-08-03: 3 mg via INTRAVENOUS
  Administered 2016-08-03: 2 mg via INTRAVENOUS
  Administered 2016-08-03: 3 mg via INTRAVENOUS

## 2016-08-03 MED ORDER — MEPERIDINE HCL 50 MG/ML IJ SOLN
INTRAMUSCULAR | Status: DC | PRN
  Administered 2016-08-03 (×2): 25 mg via INTRAVENOUS

## 2016-08-03 MED ORDER — MIDAZOLAM HCL 5 MG/5ML IJ SOLN
INTRAMUSCULAR | Status: AC
  Filled 2016-08-03: qty 10

## 2016-08-03 MED ORDER — MEPERIDINE HCL 50 MG/ML IJ SOLN
INTRAMUSCULAR | Status: AC
  Filled 2016-08-03: qty 1

## 2016-08-03 MED ORDER — SODIUM CHLORIDE 0.9 % IV SOLN
INTRAVENOUS | Status: DC
  Administered 2016-08-03: 09:00:00 via INTRAVENOUS

## 2016-08-03 MED ORDER — STERILE WATER FOR IRRIGATION IR SOLN
Status: DC | PRN
  Administered 2016-08-03: 09:00:00

## 2016-08-03 NOTE — Op Note (Signed)
[facility] Patient Name: Maria [name] Procedure Date: 08/03/2016 8:53 AM MRN: [id number] Date of Birth: 10-08-1955 Attending MD: Hildred Laser , MD CSN: [id number] Age: 60 Admit Type: Outpatient Procedure:                Upper GI endoscopy Indications:              Follow-up of esophageal varices, For therapy of                            esophageal varices Providers:                Hildred Laser, MD, Hinton Rao, RN, Charlyne Petrin RN, RN, Randa Spike, Technician Referring MD:             Glenda Chroman, MD Medicines:                Cetacaine spray, Meperidine 50 mg IV, Midazolam 8                            mg IV Complications:            No immediate complications. Estimated Blood Loss:     Estimated blood loss: none. Procedure:                Pre-Anesthesia Assessment:                           - Prior to the procedure, a History and Physical                            was performed, and patient medications and                            allergies were reviewed. The patient's tolerance of                            previous anesthesia was also reviewed. The risks                            and benefits of the procedure and the sedation                            options and risks were discussed with the patient.                            All questions were answered, and informed consent                            was obtained. Prior Anticoagulants: The patient has                            taken no previous anticoagulant or antiplatelet  agents. ASA Grade Assessment: III - A patient with                            severe systemic disease. After reviewing the risks                            and benefits, the patient was deemed in                            satisfactory condition to undergo the procedure.                           After obtaining informed consent, the endoscope was   passed under direct vision. Throughout the                            procedure, the patient's blood pressure, pulse, and                            oxygen saturations were monitored continuously. The                            [id number] ([id number]) was introduced through the                            mouth, and advanced to the second part of duodenum.                            The upper GI endoscopy was accomplished without                            difficulty. The patient tolerated the procedure                            well. Scope In: 9:22:01 AM Scope Out: 9:26:16 AM Total Procedure Duration: 0 hours 4 minutes 15 seconds  Findings:      The proximal esophagus was normal.      A post variceal banding scar was found in the mid esophagus and in the       distal esophagus. The scar tissue was healthy in appearance.      Grade I varices were found in the distal esophagus. They were small in       size.      The Z-line was regular and was found 35 cm from the incisors.      Mild portal hypertensive gastropathy was found in the gastric fundus and       in the gastric body.      Moderate gastric antral vascular ectasia without bleeding was present in       the gastric antrum.      The exam of the stomach was otherwise normal.      A few small angiodysplastic lesions without bleeding were found in the       duodenal bulb.      The second portion of the duodenum was normal. Impression:               -  Normal proximal esophagus.                           - Scar in the mid esophagus and in the distal                            esophagus.                           - Grade I esophageal varices too small to be banded.                           - Z-line regular, 35 cm from the incisors.                           - Portal hypertensive gastropathy.                           - Gastric antral vascular ectasia without bleeding.                           - A few non-bleeding angiodysplastic lesions  in the                            duodenum.                           - Normal second portion of the duodenum.                           - No specimens collected. Moderate Sedation:      Moderate (conscious) sedation was administered by the endoscopy nurse       and supervised by the endoscopist. The following parameters were       monitored: oxygen saturation, heart rate, blood pressure, CO2       capnography and response to care. Total physician intraservice time was       7 minutes. Recommendation:           - Patient has a contact number available for                            emergencies. The signs and symptoms of potential                            delayed complications were discussed with the                            patient. Return to normal activities tomorrow.                            Written discharge instructions were provided to the                            patient.                           -  Resume previous diet today.                           - Continue present medications.                           - Repeat upper endoscopy in 6 months for                            retreatment.                           - Return to GI clinic in 3 months. Procedure Code(s):        --- Professional ---                           [phone], Esophagogastroduodenoscopy, flexible,                            transoral; diagnostic, including collection of                            specimen(s) by brushing or washing, when performed                            (separate procedure) Diagnosis Code(s):        --- Professional ---                           K22.8, Other specified diseases of esophagus                           I85.00, Esophageal varices without bleeding                           K76.6, Portal hypertension                           K31.89, Other diseases of stomach and duodenum                           K31.819, Angiodysplasia of stomach and duodenum                             without bleeding CPT copyright 2016 [employer]. All rights reserved. The codes documented in this report are preliminary and upon coder review may  be revised to meet current compliance requirements. Hildred Laser, MD Hildred Laser, MD 08/03/2016 9:37:44 AM This report has been signed electronically. Number of Addenda: 0

## 2016-08-03 NOTE — Discharge Instructions (Addendum)
Resume usual medications and diet. No driving for 24 hours. Office visit in 3 months.    Upper Endoscopy, Care After Refer to this sheet in the next few weeks. These instructions provide you with information about caring for yourself after your procedure. Your health care provider may also give you more specific instructions. Your treatment has been planned according to current medical practices, but problems sometimes occur. Call your health care provider if you have any problems or questions after your procedure. What can I expect after the procedure? After the procedure, it is common to have:  A sore throat.  Bloating.  Nausea. Follow these instructions at home:  Follow instructions from your health care provider about what to eat or drink after your procedure.  Return to your normal activities as told by your health care provider. Ask your health care provider what activities are safe for you.  Take over-the-counter and prescription medicines only as told by your health care provider.  Do not drive for 24 hours if you received a sedative.  Keep all follow-up visits as told by your health care provider. This is important. Contact a health care provider if:  You have a sore throat that lasts longer than one day.  You have trouble swallowing. Get help right away if:  You have a fever.  You vomit blood or your vomit looks like coffee grounds.  You have bloody, black, or tarry stools.  You have a severe sore throat or you cannot swallow.  You have difficulty breathing.  You have severe pain in your chest or belly.

## 2016-08-03 NOTE — H&P (Signed)
Maria [name] is an 60 y.o. female.   Chief Complaint: Patient is here for EGD and possible esophageal variceal banding. HPI: Patient is 60 year old Caucasian female with cirrhosis secondary to NASH complicated by esophageal variceal bleed. She had first episode on Thanksgiving day in in 2011 and again in November 2013. She did not require banding on last exam in April this year. She states she is averaging around 2000 steps per day. She has chronic fatigue. She also has history of iron deficiency anemia and her hemoglobin earlier this week was 11.2. She has chronic thrombocytopenia. Her platelets are clumped. Therefore her actual count is higher than reported.  Past Medical History:  Diagnosis Date  . Anemia   . Cirrhosis ([place])   . Diabetes mellitus   . Esophageal bleed, variceal in November 2011 and November 2013.    Marland Kitchen Eye hemorrhage    Behind left eye  . Fibromyalgia   . Hypertension   . Hypothyroidism   . NAFLD (nonalcoholic fatty liver disease)   . Osteoarthrosis   . Osteoporosis   . PONV (postoperative nausea and vomiting)   . UTI (urinary tract infection) 11/12 end of the month   Patient feels that she passed a kidney stone at that time    Past Surgical History:  Procedure Laterality Date  . APPENDECTOMY  1980  . BILATERAL SALPINGOOPHORECTOMY    . CHOLECYSTECTOMY    . COLONOSCOPY  03/15/2011  . COLONOSCOPY N/A 03/24/2016   Procedure: COLONOSCOPY;  Surgeon: [name], MD;  [facility];  Service: Endoscopy;  Laterality: N/A;  855  . ESOPHAGEAL BANDING  04/24/2012   Procedure: ESOPHAGEAL BANDING;  Surgeon: [name], MD;  [facility];  Service: Endoscopy;  Laterality: N/A;  . Esophageal BANDING  08/03/2012   [facility] in [place] , [place]  09/24/2012   Procedure: ESOPHAGEAL BANDING;  Surgeon: [name], MD;  Location: [facility];  Service: Endoscopy;  Laterality: N/A;  Banding x 3  . ESOPHAGEAL BANDING  N/A 01/29/2013   Procedure: ESOPHAGEAL BANDING;  Surgeon: [name], MD;  [facility];  Service: Endoscopy;  Laterality: N/A;  . ESOPHAGEAL BANDING N/A 06/19/2014   Procedure: ESOPHAGEAL BANDING;  Surgeon: [name], MD;  [facility];  Service: Endoscopy;  Laterality: N/A;  . ESOPHAGEAL BANDING N/A 01/05/2016   Procedure: ESOPHAGEAL BANDING;  Surgeon: [name], MD;  [facility];  Service: Endoscopy;  Laterality: N/A;  . ESOPHAGOGASTRODUODENOSCOPY  04/24/2012   Procedure: ESOPHAGOGASTRODUODENOSCOPY (EGD);  Surgeon: [name], MD;  [facility];  Service: Endoscopy;  Laterality: N/A;  300  . ESOPHAGOGASTRODUODENOSCOPY N/A 01/29/2013   Procedure: ESOPHAGOGASTRODUODENOSCOPY (EGD);  Surgeon: [name], MD;  [facility];  Service: Endoscopy;  Laterality: N/A;  1200  . ESOPHAGOGASTRODUODENOSCOPY N/A 05/22/2013   Procedure: ESOPHAGOGASTRODUODENOSCOPY (EGD);  Surgeon: [name], MD;  [facility];  Service: Endoscopy;  Laterality: N/A;  1:55  . ESOPHAGOGASTRODUODENOSCOPY N/A 12/31/2013   Procedure: ESOPHAGOGASTRODUODENOSCOPY (EGD);  Surgeon: [name], MD;  [facility];  Service: Endoscopy;  Laterality: N/A;  1200  . ESOPHAGOGASTRODUODENOSCOPY N/A 06/19/2014   Procedure: ESOPHAGOGASTRODUODENOSCOPY (EGD);  Surgeon: [name], MD;  [facility];  Service: Endoscopy;  Laterality: N/A;  1055  . ESOPHAGOGASTRODUODENOSCOPY N/A 01/15/2015   Procedure: ESOPHAGOGASTRODUODENOSCOPY (EGD);  Surgeon: [name], MD;  [facility];  Service: Endoscopy;  Laterality: N/A;  730 - moved to 9:45 - moved to 1250-Ann notified pt  . ESOPHAGOGASTRODUODENOSCOPY N/A 06/30/2015   Procedure: ESOPHAGOGASTRODUODENOSCOPY (EGD);  Surgeon: [name], MD;  [facility];  Service: Endoscopy;  Laterality: N/A;  1200  . ESOPHAGOGASTRODUODENOSCOPY N/A 01/05/2016   Procedure:  ESOPHAGOGASTRODUODENOSCOPY (EGD);  Surgeon: [name], MD;  [facility];  Service: Endoscopy;  Laterality: N/A;  830  . ESOPHAGOGASTRODUODENOSCOPY (EGD) WITH PROPOFOL  09/24/2012   Procedure: ESOPHAGOGASTRODUODENOSCOPY (EGD) WITH PROPOFOL;  Surgeon: [name], MD;  [facility]: [facility];  Service: Endoscopy;  Laterality: N/A;  GE junction at 36  . ESOPHAGOGASTRODUODENOSCOPY W/ BANDING  08/2010  . POLYPECTOMY  03/24/2016   Procedure: POLYPECTOMY;  Surgeon: [name], MD;  [facility];  Service: Endoscopy;;  sigmoid polyp  . TONSILLECTOMY    . UPPER GASTROINTESTINAL ENDOSCOPY  03/15/2011   EGD ED BANDING/TCS  . UPPER GASTROINTESTINAL ENDOSCOPY  09/05/2010  . UPPER GASTROINTESTINAL ENDOSCOPY  08/11/2010  . VAGINAL HYSTERECTOMY      Family History  Problem Relation Age of Onset  . Lung cancer Mother   . Diabetes Father   . Diabetes Sister   . Hypertension Sister   . Hypothyroidism Sister   . Colon cancer Brother   . Diabetes Sister   . Hypothyroidism Brother   . Healthy Daughter   . Obesity Daughter   . Hypertension Daughter    Social History:  reports that she has never smoked. She has never used smokeless tobacco. She reports that she does not drink alcohol or use drugs.  Allergies: No Known Allergies  Medications Prior to Admission  Medication Sig Dispense Refill  . cholecalciferol (VITAMIN D) 1000 UNITS tablet Take 5,000 Units by mouth 3 (three) times a week.     . ferrous sulfate 325 (65 FE) MG tablet Take 325 mg by mouth 2 (two) times daily with a meal. Reported on 10/05/2015    . furosemide (LASIX) 20 MG tablet Take 1 tablet (20 mg total) by mouth daily as needed. (Patient taking differently: Take 20 mg by mouth daily as needed for fluid. ) 90 tablet 3  . insulin lispro (HUMALOG) 100 UNIT/ML injection Inject into the skin 3 (three) times daily before meals. Rate of 5 units/hour except smaller dose at night per patient. Patient may bolus as  needed.    Marland Kitchen levothyroxine (SYNTHROID, LEVOTHROID) 25 MCG tablet Take 25 mcg by mouth daily.      Marland Kitchen MILK THISTLE PO Take 450 mg by mouth daily.     . pantoprazole (PROTONIX) 40 MG tablet Take 1 tablet by mouth daily.  3  . PARoxetine (PAXIL) 40 MG tablet Take 40 mg by mouth every morning.    . potassium chloride SA (K-DUR,KLOR-CON) 20 MEQ tablet Take 1 tablet (20 mEq total) by mouth daily as needed. Takes when she takes lasix 90 tablet 3  . propranolol (INDERAL) 20 MG tablet Take 20 mg by mouth daily. MAY TAKE 2 TIMES DAILY    . magnesium oxide (MAG-OX) 400 MG tablet Take 400 mg by mouth daily.      Results for orders placed or performed during the hospital encounter of 08/03/16 (from the past 48 hour(s))  Glucose, capillary     Status: Abnormal   Collection Time: 08/03/16  8:27 AM  Result Value Ref Range   Glucose-Capillary 155 (H) 65 - 99 mg/dL   No results found.  ROS  Blood pressure Marland Kitchen)  133/52, pulse 73, temperature 98 F (36.7 C), temperature source Oral, resp. rate 16, height 5' 4"  (1.626 m), weight 238 lb (108 kg), SpO2 97 %. Physical Exam  Constitutional: She appears well-developed and well-nourished.  HENT:  Mouth/Throat: Oropharynx is clear and moist.  Eyes: Conjunctivae are normal. No scleral icterus.  Neck: No thyromegaly present.  Cardiovascular: Normal rate, regular rhythm and normal heart sounds.   No murmur heard. Respiratory: Effort normal and breath sounds normal.  GI:  Abdomen is full but soft and nontender without organomegaly or masses.  Musculoskeletal: She exhibits edema (trace edema around ankles.).  Lymphadenopathy:    She has no cervical adenopathy.  Neurological: She is alert.  Skin: Skin is warm and dry.     Assessment/Plan History of esophageal variceal bleed. Cirrhosis sec to NASH. EGD with possible esophageal variceal banding.  Hildred Laser, MD 08/03/2016, 9:06 AM

## 2016-08-04 ENCOUNTER — Encounter (INDEPENDENT_AMBULATORY_CARE_PROVIDER_SITE_OTHER): Payer: Self-pay

## 2016-08-08 ENCOUNTER — Encounter (HOSPITAL_COMMUNITY): Payer: Self-pay | Admitting: Internal Medicine

## 2016-10-20 DIAGNOSIS — E7800 Pure hypercholesterolemia, unspecified: Secondary | ICD-10-CM | POA: Diagnosis not present

## 2016-10-20 DIAGNOSIS — E039 Hypothyroidism, unspecified: Secondary | ICD-10-CM | POA: Diagnosis not present

## 2016-10-20 DIAGNOSIS — E1165 Type 2 diabetes mellitus with hyperglycemia: Secondary | ICD-10-CM | POA: Diagnosis not present

## 2016-10-23 DIAGNOSIS — E1165 Type 2 diabetes mellitus with hyperglycemia: Secondary | ICD-10-CM | POA: Diagnosis not present

## 2016-10-23 DIAGNOSIS — E7800 Pure hypercholesterolemia, unspecified: Secondary | ICD-10-CM | POA: Diagnosis not present

## 2016-10-23 DIAGNOSIS — I10 Essential (primary) hypertension: Secondary | ICD-10-CM | POA: Diagnosis not present

## 2016-10-23 DIAGNOSIS — E039 Hypothyroidism, unspecified: Secondary | ICD-10-CM | POA: Diagnosis not present

## 2016-10-24 ENCOUNTER — Encounter: Payer: Self-pay | Admitting: Internal Medicine

## 2016-10-31 DIAGNOSIS — H25013 Cortical age-related cataract, bilateral: Secondary | ICD-10-CM | POA: Diagnosis not present

## 2016-10-31 DIAGNOSIS — E119 Type 2 diabetes mellitus without complications: Secondary | ICD-10-CM | POA: Diagnosis not present

## 2016-10-31 DIAGNOSIS — H2513 Age-related nuclear cataract, bilateral: Secondary | ICD-10-CM | POA: Diagnosis not present

## 2016-10-31 DIAGNOSIS — H40013 Open angle with borderline findings, low risk, bilateral: Secondary | ICD-10-CM | POA: Diagnosis not present

## 2016-11-21 ENCOUNTER — Encounter (INDEPENDENT_AMBULATORY_CARE_PROVIDER_SITE_OTHER): Payer: Self-pay | Admitting: Internal Medicine

## 2016-11-21 ENCOUNTER — Other Ambulatory Visit (INDEPENDENT_AMBULATORY_CARE_PROVIDER_SITE_OTHER): Payer: Self-pay | Admitting: Internal Medicine

## 2016-11-21 ENCOUNTER — Other Ambulatory Visit (HOSPITAL_COMMUNITY)
Admission: RE | Admit: 2016-11-21 | Discharge: 2016-11-21 | Disposition: A | Discharge status: Home / Self Care | Payer: PPO | Source: Ambulatory Visit | Attending: Internal Medicine | Admitting: Internal Medicine

## 2016-11-21 ENCOUNTER — Ambulatory Visit (HOSPITAL_COMMUNITY)
Admission: RE | Admit: 2016-11-21 | Discharge: 2016-11-21 | Disposition: A | Discharge status: Home / Self Care | Payer: PPO | Source: Ambulatory Visit | Attending: Internal Medicine | Admitting: Internal Medicine

## 2016-11-21 ENCOUNTER — Ambulatory Visit (INDEPENDENT_AMBULATORY_CARE_PROVIDER_SITE_OTHER): Payer: PPO | Admitting: Internal Medicine

## 2016-11-21 ENCOUNTER — Encounter (INDEPENDENT_AMBULATORY_CARE_PROVIDER_SITE_OTHER): Payer: Self-pay | Admitting: *Deleted

## 2016-11-21 VITALS — BP 126/80 | HR 68 | Temp 98.2°F | Resp 18 | Ht 64.0 in | Wt 245.0 lb

## 2016-11-21 DIAGNOSIS — R0602 Shortness of breath: Secondary | ICD-10-CM

## 2016-11-21 DIAGNOSIS — K7469 Other cirrhosis of liver: Secondary | ICD-10-CM

## 2016-11-21 DIAGNOSIS — D508 Other iron deficiency anemias: Secondary | ICD-10-CM | POA: Diagnosis not present

## 2016-11-21 DIAGNOSIS — K7460 Unspecified cirrhosis of liver: Secondary | ICD-10-CM

## 2016-11-21 DIAGNOSIS — R918 Other nonspecific abnormal finding of lung field: Secondary | ICD-10-CM | POA: Insufficient documentation

## 2016-11-21 LAB — BLOOD GAS, ARTERIAL
Acid-base deficit: 1.1 mmol/L (ref 0.0–2.0)
Bicarbonate: 23.7 mmol/L (ref 20.0–28.0)
Drawn by: 234301
FIO2: 21
O2 Saturation: 93.4 %
Patient temperature: 37
pCO2 arterial: 35 mmHg (ref 32.0–48.0)
pH, Arterial: 7.427 (ref 7.350–7.450)
pO2, Arterial: 70.9 mmHg — ABNORMAL LOW (ref 83.0–108.0)

## 2016-11-21 LAB — CBC
HCT: 35.7 % — ABNORMAL LOW (ref 36.0–46.0)
Hemoglobin: 11.2 g/dL — ABNORMAL LOW (ref 12.0–15.0)
MCH: 25.1 pg — ABNORMAL LOW (ref 26.0–34.0)
MCHC: 31.4 g/dL (ref 30.0–36.0)
MCV: 79.9 fL (ref 78.0–100.0)
Platelets: DECREASED 10*3/uL (ref 150–400)
RBC: 4.47 MIL/uL (ref 3.87–5.11)
RDW: 18.6 % — ABNORMAL HIGH (ref 11.5–15.5)
WBC: 3.8 10*3/uL — ABNORMAL LOW (ref 4.0–10.5)

## 2016-11-21 LAB — HEPATIC FUNCTION PANEL
ALT: 27 U/L (ref 14–54)
AST: 41 U/L (ref 15–41)
Albumin: 3.3 g/dL — ABNORMAL LOW (ref 3.5–5.0)
Alkaline Phosphatase: 84 U/L (ref 38–126)
Bilirubin, Direct: 0.2 mg/dL (ref 0.1–0.5)
Indirect Bilirubin: 0.9 mg/dL (ref 0.3–0.9)
Total Bilirubin: 1.1 mg/dL (ref 0.3–1.2)
Total Protein: 6.5 g/dL (ref 6.5–8.1)

## 2016-11-21 LAB — PROTIME-INR
INR: 1.38
Prothrombin Time: 17.1 seconds — ABNORMAL HIGH (ref 11.4–15.2)

## 2016-11-21 NOTE — Progress Notes (Signed)
Presenting complaint;  Follow-up for chronic liver disease. Patient complains of exertional dyspnea.  Database and Subjective:  Patient is 61 year old Caucasian female who has cirrhosis secondary to NASH complicated by esophageal variceal bleed who has been banded multiple times was here for scheduled visit. She also has history of iron deficiency anemia. She was last seen on 10/05/2015. Last EGD was in November 2017 and she did not require banding.  Patient complains of fatigue and dyspnea with minimal exertion. She is not having any chest pain. She was seen by Dr. Bronson Ing over a year ago and noninvasive cardiac evaluation was negative with Lexiscan Cardiolite scan.  She states she had O2 sat and was 90 & or less. She states heartburn is well controlled with medication. She denies nausea vomiting dysphagia or abdominal pain. At times she feels full in epigastric region. Her bowels move daily. She takes furosemide usually every other day. She has gained 4 pounds since her last visit she which she believes is due to fluid. She denies melena or rectal bleeding. She has not been able to do much walking or exercise lately. She saw Dr. Bubba Camp regarding diabetes mellitus and her A1c was 6.8.     Current Medications: Outpatient Encounter Prescriptions as of 11/21/2016  Medication Sig  . cholecalciferol (VITAMIN D) 1000 UNITS tablet Take 5,000 Units by mouth 3 (three) times a week.   . furosemide (LASIX) 20 MG tablet Take 1 tablet (20 mg total) by mouth daily as needed. (Patient taking differently: Take 20 mg by mouth daily as needed for fluid. )  . insulin lispro (HUMALOG) 100 UNIT/ML injection Inject into the skin 3 (three) times daily before meals. Rate of 5 units/hour except smaller dose at night per patient. Patient may bolus as needed.  Marland Kitchen levothyroxine (SYNTHROID, LEVOTHROID) 25 MCG tablet Take 25 mcg by mouth daily.    . magnesium oxide (MAG-OX) 400 MG tablet Take 400 mg by mouth daily.   . pantoprazole (PROTONIX) 40 MG tablet Take 1 tablet by mouth daily.  Marland Kitchen PARoxetine (PAXIL) 40 MG tablet Take 40 mg by mouth every morning.  . Pediatric Multivitamins-Iron (FLINTSTONES PLUS IRON PO) Take by mouth. Patient states that she takes 2 by mouth daily.  . potassium chloride SA (K-DUR,KLOR-CON) 20 MEQ tablet Take 1 tablet (20 mEq total) by mouth daily as needed. Takes when she takes lasix  . propranolol (INDERAL) 20 MG tablet Take 20 mg by mouth daily. MAY TAKE 2 TIMES DAILY  . MILK THISTLE PO Take 450 mg by mouth daily.   . [DISCONTINUED] ferrous sulfate 325 (65 FE) MG tablet Take 325 mg by mouth 2 (two) times daily with a meal. Reported on 10/05/2015   No facility-administered encounter medications on file as of 11/21/2016.      Objective: Blood pressure 126/80, pulse 68, temperature 98.2 F (36.8 C), temperature source Oral, resp. rate 18, height 5' 4"  (1.626 m), weight 245 lb (111.1 kg). Patient is alert and in no acute distress. She does not have asterixis. Conjunctiva is pink. Sclera is nonicteric Oropharyngeal mucosa is normal. No neck masses or thyromegaly noted. Cardiac exam with regular rhythm normal S1 and S2. Faint systolic ejection murmur best heard at left sternal border. Lungs are clear to auscultation. Abdomen is full. She has butterfly(needle) in left midabdomen.  Abdomen is soft and nontender without organomegaly or masses. She has trace edema around ankles.  Labs/studies Results: H&H was 11.2 and 37.2 on 08/01/2016.   Assessment:  #1. Cirrhosis secondary to  NASH complicated by esophageal varices requiring periodic evaluation and banding. She hasn't bled in over 4 years. Even though her transaminases have remained normal I am concerned that her condition is gradually getting worse because of very sedentary lifestyle and diabetes mellitus. She underwent preliminary evaluation for [facility] at [facility] and may have to be referred back at some point in future. #2.  History of esophageal variceal bleed. Last EGD was in November 2017 and she did not need banding. #3. His free of iron deficiency anemia. It may be secondary to poor iron absorption and/or chronic blood loss due to GAVE. #4. Exertional dyspnea. Noncardiac evaluation over a year ago was negative. Need to rule out hepatopulmonary syndrome. Chest film PA and left lateral view.  Plan:  Chest film PA and left lateral view.  Room air ABG. CBC, LFTs, INR, alpha-fetoprotein. Abdominal ultrasound with Doppler study.  Office visit in 4 months.

## 2016-11-21 NOTE — Patient Instructions (Signed)
Physician will call with results of tests when completed.

## 2016-11-22 ENCOUNTER — Other Ambulatory Visit (INDEPENDENT_AMBULATORY_CARE_PROVIDER_SITE_OTHER): Payer: Self-pay | Admitting: Internal Medicine

## 2016-11-22 DIAGNOSIS — R0600 Dyspnea, unspecified: Secondary | ICD-10-CM

## 2016-11-22 DIAGNOSIS — R0609 Other forms of dyspnea: Principal | ICD-10-CM

## 2016-11-22 LAB — AFP TUMOR MARKER: AFP-Tumor Marker: 4.3 ng/mL (ref 0.0–8.3)

## 2016-11-24 ENCOUNTER — Ambulatory Visit (HOSPITAL_COMMUNITY)
Admission: RE | Admit: 2016-11-24 | Discharge: 2016-11-24 | Disposition: A | Discharge status: Home / Self Care | Payer: PPO | Source: Ambulatory Visit | Attending: Internal Medicine | Admitting: Internal Medicine

## 2016-11-24 DIAGNOSIS — K7469 Other cirrhosis of liver: Secondary | ICD-10-CM

## 2016-11-24 DIAGNOSIS — R0602 Shortness of breath: Secondary | ICD-10-CM | POA: Diagnosis not present

## 2016-11-24 DIAGNOSIS — K7460 Unspecified cirrhosis of liver: Secondary | ICD-10-CM | POA: Diagnosis not present

## 2016-11-27 ENCOUNTER — Ambulatory Visit (HOSPITAL_COMMUNITY)
Admission: RE | Admit: 2016-11-27 | Discharge: 2016-11-27 | Disposition: A | Discharge status: Home / Self Care | Payer: PPO | Source: Ambulatory Visit | Attending: Internal Medicine | Admitting: Internal Medicine

## 2016-11-27 DIAGNOSIS — I340 Nonrheumatic mitral (valve) insufficiency: Secondary | ICD-10-CM | POA: Insufficient documentation

## 2016-11-27 DIAGNOSIS — I119 Hypertensive heart disease without heart failure: Secondary | ICD-10-CM | POA: Insufficient documentation

## 2016-11-27 DIAGNOSIS — R0600 Dyspnea, unspecified: Secondary | ICD-10-CM | POA: Diagnosis not present

## 2016-11-27 DIAGNOSIS — R0609 Other forms of dyspnea: Secondary | ICD-10-CM

## 2016-11-27 DIAGNOSIS — E119 Type 2 diabetes mellitus without complications: Secondary | ICD-10-CM | POA: Insufficient documentation

## 2016-11-27 NOTE — Progress Notes (Signed)
*  PRELIMINARY RESULTS* Echocardiogram 2D Echocardiogram has been performed.  Samuel Germany 11/27/2016, 11:19 AM

## 2016-11-30 ENCOUNTER — Encounter (INDEPENDENT_AMBULATORY_CARE_PROVIDER_SITE_OTHER): Payer: Self-pay | Admitting: Internal Medicine

## 2016-11-30 ENCOUNTER — Telehealth (INDEPENDENT_AMBULATORY_CARE_PROVIDER_SITE_OTHER): Payer: Self-pay | Admitting: *Deleted

## 2016-11-30 NOTE — Telephone Encounter (Signed)
I saw the results of my echo, what next? (re: pa02 [id number]). I do not really understand the results but it appeared normal. Let me know what I need to do, thanks.Maria [name]   Dr.Rehman patient saw her results in Epic and is wanting to know the next step.

## 2016-12-01 ENCOUNTER — Other Ambulatory Visit (INDEPENDENT_AMBULATORY_CARE_PROVIDER_SITE_OTHER): Payer: Self-pay | Admitting: *Deleted

## 2016-12-02 NOTE — Telephone Encounter (Signed)
Waiting for ECHO to be completed. I requested contrast study but was not done.

## 2016-12-04 DIAGNOSIS — E1165 Type 2 diabetes mellitus with hyperglycemia: Secondary | ICD-10-CM | POA: Diagnosis not present

## 2016-12-05 ENCOUNTER — Telehealth (INDEPENDENT_AMBULATORY_CARE_PROVIDER_SITE_OTHER): Payer: Self-pay | Admitting: Internal Medicine

## 2016-12-05 NOTE — Telephone Encounter (Signed)
Patient called, she stated that Dr. Laural Golden told her to call [name] and let her know that she has not heard from the cardiologist.  [phone]

## 2016-12-06 ENCOUNTER — Other Ambulatory Visit (INDEPENDENT_AMBULATORY_CARE_PROVIDER_SITE_OTHER): Payer: Self-pay | Admitting: *Deleted

## 2016-12-07 ENCOUNTER — Other Ambulatory Visit (INDEPENDENT_AMBULATORY_CARE_PROVIDER_SITE_OTHER): Payer: Self-pay | Admitting: *Deleted

## 2016-12-07 DIAGNOSIS — R0600 Dyspnea, unspecified: Secondary | ICD-10-CM

## 2016-12-07 DIAGNOSIS — R0609 Other forms of dyspnea: Principal | ICD-10-CM

## 2016-12-07 NOTE — Telephone Encounter (Signed)
Patient has been made aware that she will have the test 12/11/2016 @1  pm [facility]. She will need to be there at 12:45 ,register at the main entrance of the hospital. This was scheduled with Terri in Cardiology. Echo limited with a bubble study.

## 2016-12-11 ENCOUNTER — Ambulatory Visit (HOSPITAL_COMMUNITY)
Admission: RE | Admit: 2016-12-11 | Discharge: 2016-12-11 | Disposition: A | Discharge status: Home / Self Care | Payer: PPO | Source: Ambulatory Visit | Attending: Internal Medicine | Admitting: Internal Medicine

## 2016-12-11 DIAGNOSIS — I10 Essential (primary) hypertension: Secondary | ICD-10-CM | POA: Diagnosis not present

## 2016-12-11 DIAGNOSIS — E785 Hyperlipidemia, unspecified: Secondary | ICD-10-CM | POA: Insufficient documentation

## 2016-12-11 DIAGNOSIS — R0600 Dyspnea, unspecified: Secondary | ICD-10-CM | POA: Diagnosis not present

## 2016-12-11 DIAGNOSIS — R0609 Other forms of dyspnea: Secondary | ICD-10-CM | POA: Diagnosis not present

## 2016-12-11 NOTE — Progress Notes (Signed)
*  PRELIMINARY RESULTS* Echocardiogram 2D Echocardiogram has been performed with Saline Bubble Contrast.  Samuel Germany 12/11/2016, 2:31 PM

## 2016-12-21 DIAGNOSIS — Z789 Other specified health status: Secondary | ICD-10-CM | POA: Diagnosis not present

## 2016-12-21 DIAGNOSIS — R0600 Dyspnea, unspecified: Secondary | ICD-10-CM | POA: Diagnosis not present

## 2016-12-21 DIAGNOSIS — M25512 Pain in left shoulder: Secondary | ICD-10-CM | POA: Diagnosis not present

## 2016-12-21 DIAGNOSIS — Z299 Encounter for prophylactic measures, unspecified: Secondary | ICD-10-CM | POA: Diagnosis not present

## 2016-12-21 DIAGNOSIS — K7460 Unspecified cirrhosis of liver: Secondary | ICD-10-CM | POA: Diagnosis not present

## 2016-12-21 DIAGNOSIS — Z6839 Body mass index (BMI) 39.0-39.9, adult: Secondary | ICD-10-CM | POA: Diagnosis not present

## 2017-01-25 ENCOUNTER — Ambulatory Visit (INDEPENDENT_AMBULATORY_CARE_PROVIDER_SITE_OTHER): Payer: PPO | Admitting: Internal Medicine

## 2017-01-25 ENCOUNTER — Encounter: Payer: Self-pay | Admitting: Internal Medicine

## 2017-01-25 DIAGNOSIS — R0600 Dyspnea, unspecified: Secondary | ICD-10-CM | POA: Diagnosis not present

## 2017-01-25 DIAGNOSIS — R0689 Other abnormalities of breathing: Secondary | ICD-10-CM

## 2017-01-25 LAB — NITRIC OXIDE: Nitric Oxide: 27

## 2017-01-25 MED ORDER — FLUTICASONE FUROATE-VILANTEROL 200-25 MCG/INH IN AEPB: 1.0000 | INHALATION_SPRAY | Freq: Every day | RESPIRATORY_TRACT | 0 refills | Status: AC

## 2017-01-25 NOTE — Assessment & Plan Note (Addendum)
Broad differential diagnosis. DOubt asthma but cannot exclude. Most likely physical deconditioning and chronic diastolic dysfunction. Other uncommon possibilities include - interstitial lung disease , pulmonary hypertension or hepato pulmonary syndrome (doubt the latter due to negative bubble echo study)  Plan - try empiric inhaled steroid daily for few weeks; call back with response - do HRCT supine and prone at [facility] - do ONO test on room air  Followup  - return to see APP in few weeks after above - if above unrevealing, do CPST bike test v right heart cath

## 2017-01-25 NOTE — Patient Instructions (Signed)
Dyspnea and respiratory abnormalities Broad differential diagnosis. DOubt asthma but cannot exclude. Most likely physical deconditioning and chronic diastolic dysfunction. Other uncommon possibilities include - interstitial lung disease , pulmonary hypertension or hepato pulmonary syndrome (doubt the latter due to negative bubble echo study)  Plan - try empiric inhaled steroid daily for few weeks; call back with response - do HRCT supine and prone at [facility] - do ONO test on room air  Followup  - return to see APP in few weeks after above - if above unrevealing, do CPST bike test v right heart cath

## 2017-01-25 NOTE — Progress Notes (Signed)
Patient ID: Maria [name], female   DOB: [birth year], 61 y.o.   MRN: [id number] Patient seen in the office today and instructed on use of breo ellipta.  Patient expressed understanding and demonstrated technique.

## 2017-01-25 NOTE — Progress Notes (Signed)
Subjective:    Patient ID: Maria [name], female    DOB: [birth year], 61 y.o.   MRN: [id number]  PCP Glenda Chroman, MD   HPI  IOV 01/25/2017  Chief Complaint  Patient presents with  . Pulmonary Consult    Pt referred by Dr. Woody Seller for SOB x 1 year. Pt c/o dry cough. Pt denies CP/tightness and chest congestion.     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 She has a history of  Obesity, metabolic syndrome ,NASH related cirrhosis and esophageal bleeding from the varices in 2011. She follows with [name] in [place]. Has routine EGD every 6 months for variceal surveillance. Last 3 EGDs per her hx without banding.  Hx oo non-obst CAD in 2004 Nuclear medicine cardiac stress test 09/22/2015 with ejection fraction 54%. Low risk study. Chest x-ray 11/21/2016: Reported as low lung volumes but otherwise clear lung fields. I personally visualized and agree. Regular cardiac echocardiogram March 2018 indeterminate diastolic dysfunction and left ventricular ejection fraction 65%. Right ventricle reported as normal. Bubble study echocardiogram 12/11/2016: Negative for shunt normal ejection fraction   Reports insidious onset of dyspnea for a year. Review of the chart shows that her first evaluation for dyspnea was in December 2016 with cardiology Dr Bronson Ing . Since then she feels that her dyspnea is progressive. It is present on exertion for doing the laundry taken a bath and relieved by rest. She does not have good sleep at night. She reports desaturation while in the hospital at night 1 time and she is admitted for cellulitis. I cannot find a record per se but then this resolved at discharge. She has not been evaluated for sleep apnea although she thinks she needs an evaluation. Recently she underwent cardiac shunt testing and this was normal [documented above  It is unclear of his associated cough and wheezing symptoms. All that time she's had wheezing improving with Lasix  Walking  desaturation test 185 feet 3 laps on room air: Resting pulse ox was 96% final pulse ox was 92%. Resting heart rate was 86/m and final heart rate was 102/m.  feNO 01/25/2017 -> 27ppb and cusp of normal  Meds show INDERAL but she has been on it many years   Results for Maria [name], Maria [name] (MRN [id number]) as of 01/25/2017 11:34  Ref. Range 11/21/2016 13:30  Sample type Unknown ARTERIAL DRAW  Delivery systems Unknown ROOM AIR  FIO2 Unknown 21.00  pH, Arterial Latest Ref Range: 7.350 - 7.450  7.427  pCO2 arterial Latest Ref Range: 32.0 - 48.0 mmHg 35.0  pO2, Arterial Latest Ref Range: 83.0 - 108.0 mmHg 70.9 (L)   Results for Maria [name], Maria [name] (MRN [id number]) as of 01/25/2017 11:34  Ref. Range 08/01/2016 10:18 11/21/2016 12:48 11/21/2016 13:30  Hemoglobin Latest Ref Range: 12.0 - 15.0 g/dL 11.2 (L) 11.2 (L)     has a past medical history of Anemia; Cirrhosis ([place]); Diabetes mellitus; Esophageal bleed, non-variceal; Eye hemorrhage; Fibromyalgia; Hypertension; Hypothyroidism; NAFLD (nonalcoholic fatty liver disease); Osteoarthrosis; Osteoporosis; PONV (postoperative nausea and vomiting); and UTI (urinary tract infection) (11/12 end of the month).   reports that she has never smoked. She has never used smokeless tobacco.  Past Surgical History:  Procedure Laterality Date  . APPENDECTOMY  1980  . BILATERAL SALPINGOOPHORECTOMY    . CHOLECYSTECTOMY    . COLONOSCOPY  03/15/2011  . COLONOSCOPY N/A 03/24/2016   Procedure: COLONOSCOPY;  Surgeon: [name], MD;  [facility];  Service: Endoscopy;  Laterality: N/A;  855  . ESOPHAGEAL BANDING  04/24/2012   Procedure: ESOPHAGEAL BANDING;  Surgeon: [name], MD;  [facility];  Service: Endoscopy;  Laterality: N/A;  . Esophageal BANDING  08/03/2012   [facility] in [place] , [place]  09/24/2012   Procedure: ESOPHAGEAL BANDING;  Surgeon: [name], MD;  [facility]: [facility];  Service: Endoscopy;   Laterality: N/A;  Banding x 3  . ESOPHAGEAL BANDING N/A 01/29/2013   Procedure: ESOPHAGEAL BANDING;  Surgeon: [name], MD;  [facility];  Service: Endoscopy;  Laterality: N/A;  . ESOPHAGEAL BANDING N/A 06/19/2014   Procedure: ESOPHAGEAL BANDING;  Surgeon: [name], MD;  [facility];  Service: Endoscopy;  Laterality: N/A;  . ESOPHAGEAL BANDING N/A 01/05/2016   Procedure: ESOPHAGEAL BANDING;  Surgeon: [name], MD;  [facility];  Service: Endoscopy;  Laterality: N/A;  . ESOPHAGEAL BANDING N/A 08/03/2016   Procedure: ESOPHAGEAL BANDING;  Surgeon: [name], MD;  [facility];  Service: Endoscopy;  Laterality: N/A;  . ESOPHAGOGASTRODUODENOSCOPY  04/24/2012   Procedure: ESOPHAGOGASTRODUODENOSCOPY (EGD);  Surgeon: [name], MD;  [facility];  Service: Endoscopy;  Laterality: N/A;  300  . ESOPHAGOGASTRODUODENOSCOPY N/A 01/29/2013   Procedure: ESOPHAGOGASTRODUODENOSCOPY (EGD);  Surgeon: [name], MD;  [facility];  Service: Endoscopy;  Laterality: N/A;  1200  . ESOPHAGOGASTRODUODENOSCOPY N/A 05/22/2013   Procedure: ESOPHAGOGASTRODUODENOSCOPY (EGD);  Surgeon: [name], MD;  [facility];  Service: Endoscopy;  Laterality: N/A;  1:55  . ESOPHAGOGASTRODUODENOSCOPY N/A 12/31/2013   Procedure: ESOPHAGOGASTRODUODENOSCOPY (EGD);  Surgeon: [name], MD;  [facility];  Service: Endoscopy;  Laterality: N/A;  1200  . ESOPHAGOGASTRODUODENOSCOPY N/A 06/19/2014   Procedure: ESOPHAGOGASTRODUODENOSCOPY (EGD);  Surgeon: [name], MD;  [facility];  Service: Endoscopy;  Laterality: N/A;  1055  . ESOPHAGOGASTRODUODENOSCOPY N/A 01/15/2015   Procedure: ESOPHAGOGASTRODUODENOSCOPY (EGD);  Surgeon: [name], MD;  [facility];  Service: Endoscopy;  Laterality: N/A;  730 - moved to 9:45 - moved to 1250-Ann notified pt  . ESOPHAGOGASTRODUODENOSCOPY N/A  06/30/2015   Procedure: ESOPHAGOGASTRODUODENOSCOPY (EGD);  Surgeon: [name], MD;  [facility];  Service: Endoscopy;  Laterality: N/A;  1200  . ESOPHAGOGASTRODUODENOSCOPY N/A 01/05/2016   Procedure: ESOPHAGOGASTRODUODENOSCOPY (EGD);  Surgeon: [name], MD;  [facility];  Service: Endoscopy;  Laterality: N/A;  830  . ESOPHAGOGASTRODUODENOSCOPY N/A 08/03/2016   Procedure: ESOPHAGOGASTRODUODENOSCOPY (EGD);  Surgeon: [name], MD;  [facility];  Service: Endoscopy;  Laterality: N/A;  930  . ESOPHAGOGASTRODUODENOSCOPY (EGD) WITH PROPOFOL  09/24/2012   Procedure: ESOPHAGOGASTRODUODENOSCOPY (EGD) WITH PROPOFOL;  Surgeon: [name], MD;  [facility];  Service: Endoscopy;  Laterality: N/A;  GE junction at 36  . ESOPHAGOGASTRODUODENOSCOPY W/ BANDING  08/2010  . POLYPECTOMY  03/24/2016   Procedure: POLYPECTOMY;  Surgeon: [name], MD;  [facility];  Service: Endoscopy;;  sigmoid polyp  . TONSILLECTOMY    . UPPER GASTROINTESTINAL ENDOSCOPY  03/15/2011   EGD ED BANDING/TCS  . UPPER GASTROINTESTINAL ENDOSCOPY  09/05/2010  . UPPER GASTROINTESTINAL ENDOSCOPY  08/11/2010  . VAGINAL HYSTERECTOMY      No Known Allergies  Immunization History  Administered Date(s) Administered  . Influenza,inj,Quad PF,36+ Mos 06/18/2016    Family History  Problem Relation Age of Onset  . Lung  cancer Mother   . Diabetes Father   . Diabetes Sister   . Hypertension Sister   . Hypothyroidism Sister   . Colon cancer Brother   . Diabetes Sister   . Hypothyroidism Brother   . Healthy Daughter   . Obesity Daughter   . Hypertension Daughter      Current Outpatient Prescriptions:  .  cholecalciferol (VITAMIN D) 1000 UNITS tablet, Take 5,000 Units by mouth 3 (three) times a week. , Disp: , Rfl:  .  furosemide (LASIX) 20 MG tablet, Take 1 tablet (20 mg total) by mouth daily as needed. (Patient taking differently: Take 20 mg by mouth daily  as needed for fluid. ), Disp: 90 tablet, Rfl: 3 .  insulin lispro (HUMALOG) 100 UNIT/ML injection, Inject into the skin 3 (three) times daily before meals. Rate of 5 units/hour except smaller dose at night per patient. Patient may bolus as needed., Disp: , Rfl:  .  levothyroxine (SYNTHROID, LEVOTHROID) 25 MCG tablet, Take 25 mcg by mouth daily.  , Disp: , Rfl:  .  pantoprazole (PROTONIX) 40 MG tablet, Take 1 tablet by mouth daily., Disp: , Rfl: 3 .  PARoxetine (PAXIL) 40 MG tablet, Take 40 mg by mouth every morning., Disp: , Rfl:  .  Pediatric Multivitamins-Iron (FLINTSTONES PLUS IRON PO), Take by mouth. Patient states that she takes 2 by mouth daily., Disp: , Rfl:  .  potassium chloride SA (K-DUR,KLOR-CON) 20 MEQ tablet, Take 1 tablet (20 mEq total) by mouth daily as needed. Takes when she takes lasix, Disp: 90 tablet, Rfl: 3 .  propranolol (INDERAL) 20 MG tablet, Take 20 mg by mouth daily. MAY TAKE 2 TIMES DAILY, Disp: , Rfl:  .  magnesium oxide (MAG-OX) 400 MG tablet, Take 400 mg by mouth daily., Disp: , Rfl:  .  MILK THISTLE PO, Take 450 mg by mouth daily. , Disp: , Rfl:     Review of Systems  Constitutional: Negative for fever and unexpected weight change.  HENT: Negative for congestion, dental problem, ear pain, nosebleeds, postnasal drip, rhinorrhea, sinus pressure, sneezing, sore throat and trouble swallowing.   Eyes: Negative for redness and itching.  Respiratory: Positive for cough and shortness of breath. Negative for chest tightness and wheezing.   Cardiovascular: Negative for palpitations and leg swelling.  Gastrointestinal: Negative for nausea and vomiting.  Genitourinary: Negative for dysuria.  Musculoskeletal: Negative for joint swelling.  Skin: Negative for rash.  Neurological: Negative for headaches.  Hematological: Does not bruise/bleed easily.  Psychiatric/Behavioral: Negative for dysphoric mood. The patient is not nervous/anxious.        Objective:   Physical Exam    Constitutional: She is oriented to person, place, and time. She appears well-developed and well-nourished. No distress.  Morbidly obese deconditioned  HENT:  Head: Normocephalic and atraumatic.  Right Ear: External ear normal.  Left Ear: External ear normal.  Mouth/Throat: Oropharynx is clear and moist. No oropharyngeal exudate.  Eyes: Conjunctivae and EOM are normal. Pupils are equal, round, and reactive to light. Right eye exhibits no discharge. Left eye exhibits no discharge. No scleral icterus.  Neck: Normal range of motion. Neck supple. No JVD present. No tracheal deviation present. No thyromegaly present.  Cardiovascular: Normal rate, regular rhythm, normal heart sounds and intact distal pulses.  Exam reveals no gallop and no friction rub.   No murmur heard. Pulmonary/Chest: Effort normal and breath sounds normal. No respiratory distress. She has no wheezes. She has no rales. She exhibits no tenderness.  Abdominal:  Soft. Bowel sounds are normal. She exhibits no distension and no mass. There is no tenderness. There is no rebound and no guarding.  Musculoskeletal: Normal range of motion. She exhibits edema. She exhibits no tenderness.  + chronic pedal eema  Lymphadenopathy:    She has no cervical adenopathy.  Neurological: She is alert and oriented to person, place, and time. She has normal reflexes. No cranial nerve deficit. She exhibits normal muscle tone. Coordination normal.  Skin: Skin is warm and dry. No rash noted. She is not diaphoretic. No erythema. No pallor.  Psychiatric: She has a normal mood and affect. Her behavior is normal. Judgment and thought content normal.  Vitals reviewed.   Vitals:   01/25/17 1128  BP: (!) 142/68  Pulse: 79  SpO2: 97%  Weight: 244 lb (110.7 kg)  Height: 5' 4"  (1.626 m)          Assessment & Plan:     ICD-9-CM ICD-10-CM   1. Dyspnea and respiratory abnormalities 786.09 R06.00     R06.89    Dyspnea and respiratory  abnormalities Broad differential diagnosis. DOubt asthma but cannot exclude. Most likely physical deconditioning and chronic diastolic dysfunction. Other uncommon possibilities include - interstitial lung disease , pulmonary hypertension or hepato pulmonary syndrome (doubt the latter due to negative bubble echo study)  Plan - try empiric inhaled steroid daily for few weeks; call back with response - do HRCT supine and prone at [facility] - do ONO test on room air  Followup  - return to see APP in few weeks after above - if above unrevealing, do CPST bike test v right heart cath       > 50% of this > 25 min visit spent in face to face counseling or coordination of care    Dr. Brand Males, M.D., [facility].C.P Pulmonary and Critical Care Medicine Staff Physician [place] [facility] Pager: [phone], If no answer or between  15:00h - 7:00h: call [phone]  01/25/2017 12:08 PM

## 2017-01-30 DIAGNOSIS — R0600 Dyspnea, unspecified: Secondary | ICD-10-CM | POA: Diagnosis not present

## 2017-01-31 ENCOUNTER — Encounter (INDEPENDENT_AMBULATORY_CARE_PROVIDER_SITE_OTHER): Payer: Self-pay | Admitting: Internal Medicine

## 2017-01-31 ENCOUNTER — Other Ambulatory Visit (INDEPENDENT_AMBULATORY_CARE_PROVIDER_SITE_OTHER): Payer: Self-pay | Admitting: Internal Medicine

## 2017-01-31 ENCOUNTER — Encounter (INDEPENDENT_AMBULATORY_CARE_PROVIDER_SITE_OTHER): Payer: Self-pay | Admitting: *Deleted

## 2017-01-31 ENCOUNTER — Ambulatory Visit (INDEPENDENT_AMBULATORY_CARE_PROVIDER_SITE_OTHER): Payer: PPO | Admitting: Internal Medicine

## 2017-01-31 VITALS — BP 154/86 | HR 72 | Temp 98.7°F | Ht 64.0 in | Wt 245.2 lb

## 2017-01-31 DIAGNOSIS — I8500 Esophageal varices without bleeding: Secondary | ICD-10-CM | POA: Diagnosis not present

## 2017-01-31 DIAGNOSIS — K760 Fatty (change of) liver, not elsewhere classified: Secondary | ICD-10-CM | POA: Diagnosis not present

## 2017-01-31 DIAGNOSIS — R0600 Dyspnea, unspecified: Secondary | ICD-10-CM | POA: Diagnosis not present

## 2017-01-31 NOTE — Progress Notes (Signed)
Subjective:    Patient ID: Maria [name], female    DOB: 1956/01/29, 61 y.o.   MRN: [id number]  HPI Here today for f/u. She was last seen in March by Dr. Laural Golden. Hx of chronic liver disease. Cirrhosis secondary to NASH complicated by esophageal variceal bleed. Has been banded multiple times.  Hx of IDA. Last EGD in November, 2017 and she did not require banding.  She says she is SOB on slight exertion or related to fluid. Heartburn controlled for the most part. She has breakthru heartburn and takes Tums.  Weight has remained the same.  She has a BM daily. No melena or BRRB. She does have urgency and sometimes are stools are loose.  Denies any abdominal pain.  Hepatic Function Panel     Component Value Date/Time   PROT 6.5 11/21/2016 1248   ALBUMIN 3.3 (L) 11/21/2016 1248   AST 41 11/21/2016 1248   ALT 27 11/21/2016 1248   ALKPHOS 84 11/21/2016 1248   BILITOT 1.1 11/21/2016 1248   BILIDIR 0.2 11/21/2016 1248   IBILI 0.9 11/21/2016 1248   CBC    Component Value Date/Time   WBC 3.8 (L) 11/21/2016 1248   RBC 4.47 11/21/2016 1248   HGB 11.2 (L) 11/21/2016 1248   HCT 35.7 (L) 11/21/2016 1248   PLT  11/21/2016 1248    PLATELET CLUMPS NOTED ON SMEAR, COUNT APPEARS DECREASED   MCV 79.9 11/21/2016 1248   MCH 25.1 (L) 11/21/2016 1248   MCHC 31.4 11/21/2016 1248   RDW 18.6 (H) 11/21/2016 1248   LYMPHSABS 0.8 10/05/2015 1502   MONOABS 0.3 10/05/2015 1502   EOSABS 0.1 10/05/2015 1502   BASOSABS 0.0 10/05/2015 1502    Review of Systems Past Medical History:  Diagnosis Date  . Anemia   . Cirrhosis ([place])   . Diabetes mellitus   . Esophageal bleed, non-variceal   . Eye hemorrhage    Behind left eye  . Fibromyalgia   . Hypertension   . Hypothyroidism   . NAFLD (nonalcoholic fatty liver disease)   . Osteoarthrosis   . Osteoporosis   . PONV (postoperative nausea and vomiting)   . UTI (urinary tract infection) 11/12 end of the month   Patient feels that she passed a  kidney stone at that time    Past Surgical History:  Procedure Laterality Date  . APPENDECTOMY  1980  . BILATERAL SALPINGOOPHORECTOMY    . CHOLECYSTECTOMY    . COLONOSCOPY  03/15/2011  . COLONOSCOPY N/A 03/24/2016   Procedure: COLONOSCOPY;  Surgeon: [name], MD;  [facility];  Service: Endoscopy;  Laterality: N/A;  855  . ESOPHAGEAL BANDING  04/24/2012   Procedure: ESOPHAGEAL BANDING;  Surgeon: [name], MD;  [facility];  Service: Endoscopy;  Laterality: N/A;  . Esophageal BANDING  08/03/2012   [facility] in [place] , [place]  09/24/2012   Procedure: ESOPHAGEAL BANDING;  Surgeon: [name], MD;  [facility]: [facility];  Service: Endoscopy;  Laterality: N/A;  Banding x 3  . ESOPHAGEAL BANDING N/A 01/29/2013   Procedure: ESOPHAGEAL BANDING;  Surgeon: [name], MD;  [facility];  Service: Endoscopy;  Laterality: N/A;  . ESOPHAGEAL BANDING N/A 06/19/2014   Procedure: ESOPHAGEAL BANDING;  Surgeon: [name], MD;  [facility];  Service: Endoscopy;  Laterality: N/A;  . ESOPHAGEAL BANDING N/A 01/05/2016   Procedure: ESOPHAGEAL BANDING;  Surgeon: [name],  MD;  [facility];  Service: Endoscopy;  Laterality: N/A;  . ESOPHAGEAL BANDING N/A 08/03/2016   Procedure: ESOPHAGEAL BANDING;  Surgeon: [name], MD;  [facility];  Service: Endoscopy;  Laterality: N/A;  . ESOPHAGOGASTRODUODENOSCOPY  04/24/2012   Procedure: ESOPHAGOGASTRODUODENOSCOPY (EGD);  Surgeon: [name], MD;  [facility];  Service: Endoscopy;  Laterality: N/A;  300  . ESOPHAGOGASTRODUODENOSCOPY N/A 01/29/2013   Procedure: ESOPHAGOGASTRODUODENOSCOPY (EGD);  Surgeon: [name], MD;  [facility];  Service: Endoscopy;  Laterality: N/A;  1200  . ESOPHAGOGASTRODUODENOSCOPY N/A 05/22/2013   Procedure: ESOPHAGOGASTRODUODENOSCOPY (EGD);  Surgeon: [name], MD;  [facility];  Service: Endoscopy;  Laterality: N/A;  1:55  . ESOPHAGOGASTRODUODENOSCOPY N/A 12/31/2013   Procedure: ESOPHAGOGASTRODUODENOSCOPY (EGD);  Surgeon: [name], MD;  [facility];  Service: Endoscopy;  Laterality: N/A;  1200  . ESOPHAGOGASTRODUODENOSCOPY N/A 06/19/2014   Procedure: ESOPHAGOGASTRODUODENOSCOPY (EGD);  Surgeon: [name], MD;  [facility];  Service: Endoscopy;  Laterality: N/A;  1055  . ESOPHAGOGASTRODUODENOSCOPY N/A 01/15/2015   Procedure: ESOPHAGOGASTRODUODENOSCOPY (EGD);  Surgeon: [name], MD;  [facility];  Service: Endoscopy;  Laterality: N/A;  730 - moved to 9:45 - moved to 1250-Ann notified pt  . ESOPHAGOGASTRODUODENOSCOPY N/A 06/30/2015   Procedure: ESOPHAGOGASTRODUODENOSCOPY (EGD);  Surgeon: [name], MD;  [facility];  Service: Endoscopy;  Laterality: N/A;  1200  . ESOPHAGOGASTRODUODENOSCOPY N/A 01/05/2016   Procedure: ESOPHAGOGASTRODUODENOSCOPY (EGD);  Surgeon: [name], MD;  [facility];  Service: Endoscopy;  Laterality: N/A;  830  . ESOPHAGOGASTRODUODENOSCOPY N/A 08/03/2016   Procedure: ESOPHAGOGASTRODUODENOSCOPY (EGD);  Surgeon: [name], MD;  [facility];  Service: Endoscopy;  Laterality: N/A;  930  . ESOPHAGOGASTRODUODENOSCOPY (EGD) WITH PROPOFOL  09/24/2012   Procedure: ESOPHAGOGASTRODUODENOSCOPY (EGD) WITH PROPOFOL;  Surgeon: [name], MD;  [facility];  Service: Endoscopy;  Laterality: N/A;  GE junction at 36  . ESOPHAGOGASTRODUODENOSCOPY W/ BANDING  08/2010  . POLYPECTOMY  03/24/2016   Procedure: POLYPECTOMY;  Surgeon: [name], MD;  [facility];  Service: Endoscopy;;  sigmoid polyp  . TONSILLECTOMY    . UPPER GASTROINTESTINAL ENDOSCOPY  03/15/2011   EGD ED BANDING/TCS  . UPPER GASTROINTESTINAL ENDOSCOPY  09/05/2010  . UPPER GASTROINTESTINAL ENDOSCOPY  08/11/2010  . VAGINAL HYSTERECTOMY      No Known  Allergies  Current Outpatient Prescriptions on File Prior to Visit  Medication Sig Dispense Refill  . cholecalciferol (VITAMIN D) 1000 UNITS tablet Take 5,000 Units by mouth 3 (three) times a week.     . furosemide (LASIX) 20 MG tablet Take 1 tablet (20 mg total) by mouth daily as needed. (Patient taking differently: Take 20 mg by mouth daily as needed for fluid. ) 90 tablet 3  . insulin lispro (HUMALOG) 100 UNIT/ML injection Inject into the skin 3 (three) times daily before meals. Rate of 5 units/hour except smaller dose at night per patient. Patient may bolus as needed.    Marland Kitchen levothyroxine (SYNTHROID, LEVOTHROID) 25 MCG tablet Take 25 mcg by mouth daily.      . magnesium oxide (MAG-OX) 400 MG tablet Take 400 mg by mouth daily.    Marland Kitchen MILK THISTLE PO Take 450 mg by mouth daily.     . pantoprazole (PROTONIX) 40 MG tablet Take 1 tablet by mouth daily.  3  . PARoxetine (PAXIL) 40 MG  tablet Take 40 mg by mouth every morning.    . Pediatric Multivitamins-Iron (FLINTSTONES PLUS IRON PO) Take by mouth. Patient states that she takes 2 by mouth daily.    . potassium chloride SA (K-DUR,KLOR-CON) 20 MEQ tablet Take 1 tablet (20 mEq total) by mouth daily as needed. Takes when she takes lasix 90 tablet 3  . propranolol (INDERAL) 20 MG tablet Take 20 mg by mouth daily. MAY TAKE 2 TIMES DAILY     No current facility-administered medications on file prior to visit.        Objective:   Physical Exam Blood pressure (!) 154/86, pulse 72, temperature 98.7 F (37.1 C), height 5' 4"  (1.626 m), weight 245 lb 3.2 oz (111.2 kg). Alert and oriented. Skin warm and dry. Oral mucosa is moist.   . Sclera anicteric, conjunctivae is pink. Thyroid not enlarged. No cervical lymphadenopathy. Lungs clear. Heart regular rate and rhythm. Murmur heard  Abdomen is soft. Bowel sounds are positive. No hepatomegaly. No abdominal masses felt. No tenderness.  1+ edema to lower extremities.  .        Assessment & Plan:  Hx of  cirrhosis complicated by Esophageal varices. Last banding was in November. EGD/banding.I discussed with Dr. Laural Golden.

## 2017-01-31 NOTE — Patient Instructions (Signed)
OV in 6 month

## 2017-02-06 ENCOUNTER — Other Ambulatory Visit (INDEPENDENT_AMBULATORY_CARE_PROVIDER_SITE_OTHER): Payer: Self-pay | Admitting: Internal Medicine

## 2017-02-08 ENCOUNTER — Ambulatory Visit (HOSPITAL_COMMUNITY)
Admission: RE | Admit: 2017-02-08 | Discharge: 2017-02-08 | Disposition: A | Discharge status: Home / Self Care | Payer: PPO | Source: Ambulatory Visit | Attending: Internal Medicine | Admitting: Internal Medicine

## 2017-02-08 DIAGNOSIS — K766 Portal hypertension: Secondary | ICD-10-CM | POA: Insufficient documentation

## 2017-02-08 DIAGNOSIS — I2510 Atherosclerotic heart disease of native coronary artery without angina pectoris: Secondary | ICD-10-CM | POA: Diagnosis not present

## 2017-02-08 DIAGNOSIS — R0600 Dyspnea, unspecified: Secondary | ICD-10-CM | POA: Insufficient documentation

## 2017-02-08 DIAGNOSIS — I700 Atherosclerosis of aorta: Secondary | ICD-10-CM | POA: Insufficient documentation

## 2017-02-08 DIAGNOSIS — R062 Wheezing: Secondary | ICD-10-CM | POA: Diagnosis not present

## 2017-02-08 DIAGNOSIS — R0689 Other abnormalities of breathing: Secondary | ICD-10-CM | POA: Diagnosis not present

## 2017-02-14 ENCOUNTER — Telehealth (INDEPENDENT_AMBULATORY_CARE_PROVIDER_SITE_OTHER): Payer: Self-pay | Admitting: *Deleted

## 2017-02-14 NOTE — Telephone Encounter (Signed)
Per Dr.Rehman may call in the KCL 10 mEq - Patient may take 2 tabs by mouth daily . 1 month supply- may do the 3 month supply with 3 refills. This was left on the secure voice mail of Clarise Cruz , the representative who called from the [employer]. Note the previous Rx - the 20 mEq is on a backorder.

## 2017-02-16 ENCOUNTER — Telehealth: Payer: Self-pay | Admitting: Internal Medicine

## 2017-02-16 NOTE — Telephone Encounter (Signed)
Sarah : You are seeing this NASH cirrhosis lady ? 02/19/17. She has dyspnea. She has aitrapping n CT and borderline high feno -> you can see if the mdi I gave her helped or not. Other possblity is porto-pulm htn. She has mild PA enalrgement on CT chest -> please have her cardiologist set her up for Right heart cath  [facility]: if she is no show on 02/19/17 please let patint know these results  Thanks  Dr. Brand Males, M.D., [facility].C.P Pulmonary and Critical Care Medicine Staff Physician [place] [facility] Pager: [phone], If no answer or between  15:00h - 7:00h: call [phone]  02/16/2017 5:35 AM      IMPRESSION: 1. No evidence of interstitial lung disease. 2. Moderate air trapping indicative of small airways disease. 3. Mild dilatation of the pulmonic trunk (3.5 cm in diameter), concerning for pulmonary arterial hypertension. 4. Stigmata of cirrhosis with portal hypertension including splenomegaly and small volume of ascites redemonstrated, as above. 5. Aortic atherosclerosis, in addition to left main and left anterior descending coronary artery disease. Please note that although the presence of coronary artery calcium documents the presence of coronary artery disease, the severity of this disease and any potential stenosis cannot be assessed on this non-gated CT examination. Assessment for potential risk factor modification, dietary therapy or pharmacologic therapy may be warranted, if clinically indicated.   Electronically Signed   By: Vinnie Langton M.D.   On: 02/09/2017 10:23

## 2017-02-20 NOTE — Telephone Encounter (Signed)
Pt is being seen on 02/22/2017.

## 2017-02-22 ENCOUNTER — Encounter: Payer: Self-pay | Admitting: Acute Care

## 2017-02-22 ENCOUNTER — Ambulatory Visit (INDEPENDENT_AMBULATORY_CARE_PROVIDER_SITE_OTHER): Payer: PPO | Admitting: Acute Care

## 2017-02-22 ENCOUNTER — Telehealth: Payer: Self-pay | Admitting: Internal Medicine

## 2017-02-22 VITALS — BP 126/58 | HR 73

## 2017-02-22 DIAGNOSIS — R0689 Other abnormalities of breathing: Secondary | ICD-10-CM

## 2017-02-22 DIAGNOSIS — R0600 Dyspnea, unspecified: Secondary | ICD-10-CM

## 2017-02-22 DIAGNOSIS — G4733 Obstructive sleep apnea (adult) (pediatric): Secondary | ICD-10-CM

## 2017-02-22 DIAGNOSIS — G4734 Idiopathic sleep related nonobstructive alveolar hypoventilation: Secondary | ICD-10-CM

## 2017-02-22 DIAGNOSIS — R0609 Other forms of dyspnea: Secondary | ICD-10-CM | POA: Diagnosis not present

## 2017-02-22 MED ORDER — FLUTICASONE FUROATE-VILANTEROL 200-25 MCG/INH IN AEPB: 1.0000 | INHALATION_SPRAY | Freq: Every day | RESPIRATORY_TRACT | 3 refills | Status: DC

## 2017-02-22 NOTE — Telephone Encounter (Signed)
Spoke with SG. I have printed this message and she is going to go over these results with her at her appointment. Message will be closed.

## 2017-02-22 NOTE — Assessment & Plan Note (Signed)
Overnight oximetry 01/31/2017 indicates pulse ox was 80% for 2 hours and 27 minutes Plan Your overnight oxygen test indicated that your oxygen levels are dropping at night when you sleep We will send in an order for oxygen at 2 L [name] every night.( Please order with humidification) Please inquire about oxygen for use with travel. Wear your oxygen every night when you go to bed at 2 L [name], and with naps during the day. We will schedule a split night sleep study to evaluate for Obstructive Sleep Apnea. We will schedule a Right heart cath to evaluate pulmonary hypertension. Continue using BREO every day 1 puff. This is a maintenance inhaler and should be used every day without fail. Rinse mouth after use We will send in a prescription for the Breo to [facility] in [place]. Follow up with Dr. Chase Caller or Judson Roch NP  after cath and split night study. Please call us and let us know how you are doing on the oxygen at night. Please contact office for sooner follow up if symptoms do not improve or worsen or seek emergency care   Extensive teaching was done regarding maintenance medication use versus rescue medication use

## 2017-02-22 NOTE — Progress Notes (Addendum)
History of Present Illness Maria [name] is a 61 y.o. female never smoker with , obesity, metabolic syndrome, and [name] related cirrhosis with esophageal bleeding. She was seen for consultation by Dr. Chase Caller to evaluate shortness of breath and cough.   02/22/2017 2 week Follow up per Dr. Chase Caller: Patient was seen by Dr. Chase Caller 01/25/2017 referred for consult by Dr. Woody Seller  for progressive shortness of breath 1 year and cough. Plan per Dr. Chase Caller at that time was as follows: Plan - try empiric inhaled steroid daily for few weeks; call back with response - do HRCT supine and prone at [facility] - do ONO test on room air  Followup  - return to see APP in few weeks after above - if above unrevealing, do CPST bike test v right heart cath   Patient returns for follow-up. She states she is compliant with her PPI but not her Breo.She continues to complain of poor sleep and fatigue. HRCT was completed as well as oxygen and oximetry testing on room air. Patient denies fever chest pain orthopnea or hemoptysis.  Test Results: HRCT 02/08/2017 No evidence of interstitial lung disease. 2. Moderate air trapping indicative of small airways disease. 3. Mild dilatation of the pulmonic trunk (3.5 cm in diameter), concerning for pulmonary arterial hypertension. 4. Stigmata of cirrhosis with portal hypertension including splenomegaly and small volume of ascites redemonstrated, as above. 5. Aortic atherosclerosis, in addition to left main and left anterior descending coronary artery disease   O&O 01/31/2017 pulse ox less than 88% for 2 hours 27 minutes  01/25/2017>>Walking desaturation test 185 feet 3 laps on room air: Resting pulse ox was 96% final pulse ox was 92%. Resting heart rate was 86/m and final heart rate was 102/m.  feNO 01/25/2017 -> 27ppb and cusp of normal  RHC 04/16/2017>> 1. Elevated right and left heart filling pressures but suggestive of RV > LV failure.  2. PVR 2.6 WU  is suggestive primarily of pulmonary venous hypertension (group 2) though she is at risk for porto-pulmonary hypertension.  Recommendations per Dr. Kirk Ruths:  I will have her increase Lasix to 40 mg daily with KCl 20 daily.  Once she is well-diuresed, would consider repeating RHC to make sure pulmonary hypertension improves with diuresis or if there is actually evidence for pulmonary arterial hypertension, for which I would consider treatment with pulmonary vasodilators.   CBC Latest Ref Rng & Units 11/21/2016 08/01/2016 10/22/2015  WBC 4.0 - 10.5 K/uL 3.8(L) - 4.2  Hemoglobin 12.0 - 15.0 g/dL 11.2(L) 11.2(L) 10.6(L)  Hematocrit 36.0 - 46.0 % 35.7(L) 37.2 34.5(L)  Platelets 150 - 400 K/uL PLATELET CLUMPS NOTED ON SMEAR, COUNT APPEARS DECREASED - 18(LL)    BMP Latest Ref Rng & Units 10/22/2014 04/01/2014 11/07/2013  Glucose 70 - 99 mg/dL 138(H) 126(H) 110(H)  BUN 6 - 23 mg/dL 10 6 10   Creatinine 0.50 - 1.10 mg/dL 0.69 0.66 0.57  Sodium 135 - 145 mEq/L 140 141 143  Potassium 3.5 - 5.3 mEq/L 3.7 3.8 3.7  Chloride 96 - 112 mEq/L 105 107 110  CO2 19 - 32 mEq/L 31 26 27   Calcium 8.4 - 10.5 mg/dL 9.4 8.9 8.7    BCt Chest High Resolution  Result Date: 02/09/2017 CLINICAL DATA:  61 year old female with history of dyspnea, cough and wheezing for 1 year. Nonsmoker. EXAM: CT CHEST WITHOUT CONTRAST TECHNIQUE: Multidetector CT imaging of the chest was performed following the standard protocol without intravenous contrast. High resolution imaging of the lungs, as  well as inspiratory and expiratory imaging, was performed. COMPARISON:  No priors. FINDINGS: Cardiovascular: Heart size is normal. There is no significant pericardial fluid, thickening or pericardial calcification. There is aortic atherosclerosis, as well as atherosclerosis of the great vessels of the mediastinum and the coronary arteries, including calcified atherosclerotic plaque in the left main and left anterior descending coronary arteries. Mild  dilatation of the pulmonic trunk (3.5 cm in diameter), which could indicate pulmonary arterial hypertension. Mediastinum/Nodes: No pathologically enlarged mediastinal or hilar lymph nodes. Please note that accurate exclusion of hilar adenopathy is limited on noncontrast CT scans. Esophagus is unremarkable in appearance. No axillary lymphadenopathy. Lungs/Pleura: High-resolution images demonstrate no significant regions of ground-glass attenuation, subpleural reticulation, parenchymal banding, traction bronchiectasis or frank honeycombing. Inspiratory and expiratory imaging demonstrates moderate air trapping indicative of small airways disease. There is no acute consolidative airspace disease and no pleural effusions. No suspicious appearing pulmonary nodules or masses are noted. Tiny calcified granuloma in the right middle lobe. Upper Abdomen: Liver has a shrunken appearance and nodular contour, compatible with underlying cirrhosis. Portal vein appears mildly dilated measuring 16 mm in diameter. Spleen is incompletely visualized, but is clearly enlarged measuring up to 16.0 x 8.2 cm on axial images. Small volume of perihepatic ascites. Aortic atherosclerosis. Musculoskeletal: There are no aggressive appearing lytic or blastic lesions noted in the visualized portions of the skeleton. IMPRESSION: 1. No evidence of interstitial lung disease. 2. Moderate air trapping indicative of small airways disease. 3. Mild dilatation of the pulmonic trunk (3.5 cm in diameter), concerning for pulmonary arterial hypertension. 4. Stigmata of cirrhosis with portal hypertension including splenomegaly and small volume of ascites redemonstrated, as above. 5. Aortic atherosclerosis, in addition to left main and left anterior descending coronary artery disease. Please note that although the presence of coronary artery calcium documents the presence of coronary artery disease, the severity of this disease and any potential stenosis cannot be  assessed on this non-gated CT examination. Assessment for potential risk factor modification, dietary therapy or pharmacologic therapy may be warranted, if clinically indicated. Electronically Signed   By: Vinnie Langton M.D.   On: 02/09/2017 10:23     Past medical hx Past Medical History:  Diagnosis Date  . Anemia   . Cirrhosis ([place])   . Diabetes mellitus   . Esophageal bleed, non-variceal   . Eye hemorrhage    Behind left eye  . Fibromyalgia   . Hypertension   . Hypothyroidism   . NAFLD (nonalcoholic fatty liver disease)   . Osteoarthrosis   . Osteoporosis   . PONV (postoperative nausea and vomiting)   . UTI (urinary tract infection) 11/12 end of the month   Patient feels that she passed a kidney stone at that time     Social History  Substance Use Topics  . Smoking status: Never Smoker  . Smokeless tobacco: Never Used  . Alcohol use No    Tobacco Cessation: Counseling given: Yes Patient is a never smoker  Past surgical hx, Family hx, Social hx all reviewed.  Current Outpatient Prescriptions on File Prior to Visit  Medication Sig  . cholecalciferol (VITAMIN D) 1000 UNITS tablet Take 5,000 Units by mouth 3 (three) times a week.   . furosemide (LASIX) 20 MG tablet Take 1 tablet by mouth once daily as needed  . insulin lispro (HUMALOG) 100 UNIT/ML injection Inject into the skin 3 (three) times daily before meals. Rate of 5 units/hour except smaller dose at night per patient. Patient may bolus as  needed.  Marland Kitchen levothyroxine (SYNTHROID, LEVOTHROID) 25 MCG tablet Take 25 mcg by mouth daily.    . magnesium oxide (MAG-OX) 400 MG tablet Take 400 mg by mouth daily.  Marland Kitchen MILK THISTLE PO Take 450 mg by mouth daily.   . pantoprazole (PROTONIX) 40 MG tablet Take 1 tablet by mouth once daily  . PARoxetine (PAXIL) 40 MG tablet Take 40 mg by mouth every morning.  . Pediatric Multivitamins-Iron (FLINTSTONES PLUS IRON PO) Take by mouth. Patient states that she takes 2 by mouth daily.  .  potassium chloride SA (K-DUR,KLOR-CON) 20 MEQ tablet Take 1 tablet (20 mEq total) by mouth daily as needed. Takes when she takes lasix  . propranolol (INDERAL) 20 MG tablet Take 20 mg by mouth daily. MAY TAKE 2 TIMES DAILY   No current facility-administered medications on file prior to visit.      No Known Allergies  Review Of Systems:  Constitutional:   No  weight loss, night sweats,  Fevers, chills, +fatigue, or + lassitude.  HEENT:   No headaches,  Difficulty swallowing,  Tooth/dental problems, or  Sore throat,                No sneezing, itching, ear ache, nasal congestion, post nasal drip,   CV:  No chest pain,  Orthopnea, PND, +swelling in lower extremities,no anasarca, dizziness, palpitations, syncope.   GI  No heartburn, indigestion, abdominal pain, nausea, vomiting, diarrhea, change in bowel habits, loss of appetite, bloody stools.   Resp: + shortness of breath with exertion less at rest.  No excess mucus, no productive cough,  No non-productive cough,  No coughing up of blood.  No change in color of mucus.  No wheezing.  No chest wall deformity  Skin: no rash or lesions.  GU: no dysuria, change in color of urine, no urgency or frequency.  No flank pain, no hematuria   MS:  No joint pain or swelling.  No decreased range of motion.  No back pain.  Psych:  No change in mood or affect. No depression or anxiety.  No memory loss.   Vital Signs BP (!) 126/58 (BP Location: Right Arm, Cuff Size: Large)   Pulse 73   SpO2 97%    Physical Exam:  General- No distress,  A&Ox3, flat affect ENT: No sinus tenderness, TM clear, pale nasal mucosa, no oral exudate,no post nasal drip, no LAN Cardiac: S1, S2, regular rate and rhythm, no murmur Chest: No wheeze/ rales/ dullness; no accessory muscle use, no nasal flaring, no sternal retractions, diminished bilaterally per bases Abd.: Soft Non-tender, obese Ext: No clubbing cyanosis, edema Neuro:  Deconditioned at baseline Skin: No  rashes, warm and dry Psych: Flat affect, appears depressed   Assessment/Plan  Nocturnal hypoxemia Overnight oximetry 01/31/2017 indicates pulse ox was 80% for 2 hours and 27 minutes Plan Your overnight oxygen test indicated that your oxygen levels are dropping at night when you sleep We will send in an order for oxygen at 2 L [place] every night.( Please order with humidification) Please inquire about oxygen for use with travel. Wear your oxygen every night when you go to bed at 2 L [place], and with naps during the day. We will schedule a split night sleep study to evaluate for Obstructive Sleep Apnea. We will schedule a Right heart cath to evaluate pulmonary hypertension. Continue using BREO every day 1 puff. This is a maintenance inhaler and should be used every day without fail. Rinse mouth after use We will  send in a prescription for the Breo to [facility] in [place]. Follow up with Dr. Chase Caller or Judson Roch NP  after cath and split night study. Please call us and let us know how you are doing on the oxygen at night. Please contact office for sooner follow up if symptoms do not improve or worsen or seek emergency care   Extensive teaching was done regarding maintenance medication use versus rescue medication use   Dyspnea and respiratory abnormalities Overnight oximetry indicates desaturations at night Plan Start oxygen at 2 L nasal cannula daily at bedtime Refer for sleep study to evaluate for OSA Refer for right heart cath to evaluate portal and pulmonary hypertension    Magdalen Spatz, NP 02/22/2017  8:37 PM

## 2017-02-22 NOTE — Assessment & Plan Note (Signed)
Overnight oximetry indicates desaturations at night Plan Start oxygen at 2 L nasal cannula daily at bedtime Refer for sleep study to evaluate for OSA Refer for right heart cath to evaluate portal and pulmonary hypertension

## 2017-02-22 NOTE — Telephone Encounter (Signed)
Pt was seen by SG on 02/22/17. Will sign off.

## 2017-02-22 NOTE — Telephone Encounter (Signed)
Clarise Cruz you are seeing pateiont 02/22/2017   ONO - 01/31/17 pusle ox </= 88% is for 2h 27 min - start 2L [place] atnight  Not sue why she desats at night because CT did no show ILD  Plan Refer sleep study Refer right heart cathdue to possibility of porto pulm htn   Dr. Brand Males, M.D., [facility].C.P Pulmonary and Critical Care Medicine Staff Physician [facility] Pager: [phone], If no answer or between  15:00h - 7:00h: call [phone]  02/22/2017 1:37 AM

## 2017-02-22 NOTE — Patient Instructions (Addendum)
It is good to see you today. Your overnight oxygen test indicated that your oxygen levels are dropping at night when you sleep We will send in an order for oxygen at 2 L [place] every night.( Please order with humidification) Please inquire about oxygen for use with travel. Wear your oxygen every night when you go to bed at 2 L [place], and with naps during the day. We will schedule a split night sleep study to evaluate for Obstructive Sleep Apnea. We will schedule a Right heart cath to evaluate pulmonary hypertension. Continue using BREO every day 1 puff. This is a maintenance inhaler and should be used every day without fail. Rinse mouth after use We will send in a prescription for the Breo to [facility] in [place]. Follow up with Dr. Chase Caller or Judson Roch NP  after cath and split night study. Please call us and let us know how you are doing on the oxygen at night. Please contact office for sooner follow up if symptoms do not improve or worsen or seek emergency care

## 2017-03-05 DIAGNOSIS — E1165 Type 2 diabetes mellitus with hyperglycemia: Secondary | ICD-10-CM | POA: Diagnosis not present

## 2017-03-12 ENCOUNTER — Ambulatory Visit: Payer: PPO | Attending: Acute Care | Admitting: Internal Medicine

## 2017-03-12 DIAGNOSIS — G4733 Obstructive sleep apnea (adult) (pediatric): Secondary | ICD-10-CM

## 2017-03-12 DIAGNOSIS — R0683 Snoring: Secondary | ICD-10-CM

## 2017-03-12 DIAGNOSIS — R0600 Dyspnea, unspecified: Secondary | ICD-10-CM | POA: Diagnosis not present

## 2017-03-12 DIAGNOSIS — R0689 Other abnormalities of breathing: Secondary | ICD-10-CM | POA: Diagnosis not present

## 2017-03-24 DIAGNOSIS — R0602 Shortness of breath: Secondary | ICD-10-CM | POA: Diagnosis not present

## 2017-03-27 ENCOUNTER — Encounter: Payer: Self-pay | Admitting: Acute Care

## 2017-03-27 DIAGNOSIS — I272 Pulmonary hypertension, unspecified: Secondary | ICD-10-CM

## 2017-03-27 NOTE — Telephone Encounter (Signed)
Please call patient and let her know the sleep study has not yet been read by a sleep physician. Let her know we will call her with the results once it has been read. Additionally per the visit notes 02/22/2017, the patient was supposed to be referred for a right heart cath. I do not see documentation that she has an appointment for referral for right heart cath. The patient will need to be referred to cardiology for a right heart cath to evaluate for portal pulmonary hypertension as cause of dyspnea as was previously requested on 02/22/2017. We need to schedule patient for an office visit once sleep study has been read, to go over results. She needs to continue to wear her oxygen at night as she has been doing. Please ask the patient if she is currently seeing a cardiologist  and scheduled cath through that physician if she is already established. If she is not established please send referral to [facility] heart care.Thanks so much. ===View-only below this line===  ----- Message ----- From: Valerie Salts, CMA Sent: 03/27/2017   2:06 PM To: Magdalen Spatz, NP Subject: RE: Visit Follow-Up Question                   ----- Message from Valerie Salts, [place] sent at 03/27/2017  2:06 PM EDT -----    ----- Message sent from Valerie Salts, [place] to Maria [name] at 03/27/2017  2:06 PM -----  [name], we will get your message over to Judson Roch now. We will be in contact with you shortly.   Cherina P.   ----- Message -----    From: Maria [name]    Sent: 03/27/2017 12:29 PM EDT      To: Magdalen Spatz, NP Subject: Visit Follow-Up Question  When do I get results of sleep study? (done 6/25) Do I need the Right side heart cath? Inhaler Memory Dance) has been helping Not really sure about  oxygen, not sure how to really tell if it is helping at night,as I have no way of measuring sats. It did help when I had extra fluid a few days, Lasix helped but used O2 during naps until fluid off and less SOB. [place]  [place]

## 2017-03-27 NOTE — Telephone Encounter (Signed)
Spoke with pt over the phone. She is aware of SG's message. Referral has been placed for the right heart cath. Pt prefers to have this done at [facility] in [place], [place]. Nothing further was needed at this time.

## 2017-03-27 NOTE — Telephone Encounter (Signed)
When do I get results of sleep study? (done 6/25) Do I need the Right side heart cath? Inhaler Memory Dance) has been helping Not really sure about  oxygen, not sure how to really tell if it is helping at night,as I have no way of measuring sats. It did help when I had extra fluid a few days, Lasix helped but used O2 during naps until fluid off and less SOB. will be out of state July 16-23  SG, please advise. Thanks!

## 2017-04-03 ENCOUNTER — Telehealth (HOSPITAL_COMMUNITY): Payer: Self-pay | Admitting: *Deleted

## 2017-04-03 ENCOUNTER — Ambulatory Visit (INDEPENDENT_AMBULATORY_CARE_PROVIDER_SITE_OTHER): Payer: PPO | Admitting: Internal Medicine

## 2017-04-03 NOTE — Telephone Encounter (Signed)
Pt needs RHC per Pulmonary for Dyspnea, eval for pulm htn,  Attempted to call pt to schedule and Left message to call back

## 2017-04-03 NOTE — Telephone Encounter (Signed)
-----   Message from Ilona Sorrel sent at 03/28/2017  9:41 AM EDT ----- Regarding: RE: heart cath I checked with Dr Court Joy nurse Baker Janus.  Pt has not seen him since 09/2015 and she was to follow up prn.  They do not do cath's in [place] or [place] & he does not do them at all.  Please schedule at [facility] as usual with one of your docs.  Judeen Hammans ----- Message ----- From: Scarlette Calico, RN Sent: 03/28/2017   9:11 AM To: Ilona Sorrel Subject: RE: heart cath                                 There is no cath lab in [place] so caths are not done there.  I see that she is a patient of Dr Bronson Ing in the [place] office so is she wants him to do her cath that is fine you just need to contact his office. ----- Message ----- From: Ilona Sorrel Sent: 03/28/2017   8:39 AM To: Scarlette Calico, RN Subject: heart cath                                     Referral has been put in by Eric Form NP for heart cath.  Pt would prefer to do with [facility] in [place].  I didn't know if this is a possibility.   Judeen Hammans

## 2017-04-06 NOTE — Telephone Encounter (Signed)
Called patient to schedule but had to leave message to call us back.

## 2017-04-09 ENCOUNTER — Encounter (HOSPITAL_COMMUNITY): Payer: Self-pay

## 2017-04-09 NOTE — Telephone Encounter (Signed)
Another attempt to reach patient to schedule RHC unsuccessful. Will mail letter to patient to advise to contact our office and will forward to referring providers to make aware.  Renee Pain, RN

## 2017-04-09 NOTE — Progress Notes (Signed)
Letter mailed to patient to schedule RHC per Eric Form NP referral

## 2017-04-11 ENCOUNTER — Telehealth (HOSPITAL_COMMUNITY): Payer: Self-pay

## 2017-04-11 ENCOUNTER — Encounter (HOSPITAL_COMMUNITY): Payer: Self-pay

## 2017-04-11 NOTE — Telephone Encounter (Signed)
Patient scheduled for RHC with Dr. Aundra Dubin Monday 7/30 at 9 am. Instructions reviewed with patient, no questions or concerns, will mail letter with detailed instructions.  Renee Pain, RN

## 2017-04-16 ENCOUNTER — Encounter (HOSPITAL_COMMUNITY)
Admission: RE | Disposition: A | Discharge status: Home / Self Care | Payer: Self-pay | Source: Ambulatory Visit | Attending: Cardiology

## 2017-04-16 ENCOUNTER — Ambulatory Visit (HOSPITAL_COMMUNITY)
Admission: RE | Admit: 2017-04-16 | Discharge: 2017-04-16 | Disposition: A | Discharge status: Home / Self Care | Payer: PPO | Source: Ambulatory Visit | Attending: Cardiology | Admitting: Cardiology

## 2017-04-16 ENCOUNTER — Other Ambulatory Visit: Payer: Self-pay

## 2017-04-16 ENCOUNTER — Encounter (HOSPITAL_COMMUNITY): Payer: Self-pay | Admitting: Cardiology

## 2017-04-16 DIAGNOSIS — E039 Hypothyroidism, unspecified: Secondary | ICD-10-CM | POA: Diagnosis not present

## 2017-04-16 DIAGNOSIS — M1990 Unspecified osteoarthritis, unspecified site: Secondary | ICD-10-CM | POA: Insufficient documentation

## 2017-04-16 DIAGNOSIS — M810 Age-related osteoporosis without current pathological fracture: Secondary | ICD-10-CM | POA: Diagnosis not present

## 2017-04-16 DIAGNOSIS — K760 Fatty (change of) liver, not elsewhere classified: Secondary | ICD-10-CM | POA: Insufficient documentation

## 2017-04-16 DIAGNOSIS — E119 Type 2 diabetes mellitus without complications: Secondary | ICD-10-CM | POA: Insufficient documentation

## 2017-04-16 DIAGNOSIS — I509 Heart failure, unspecified: Secondary | ICD-10-CM | POA: Insufficient documentation

## 2017-04-16 DIAGNOSIS — I272 Pulmonary hypertension, unspecified: Secondary | ICD-10-CM | POA: Insufficient documentation

## 2017-04-16 DIAGNOSIS — M797 Fibromyalgia: Secondary | ICD-10-CM | POA: Insufficient documentation

## 2017-04-16 DIAGNOSIS — I110 Hypertensive heart disease with heart failure: Secondary | ICD-10-CM | POA: Insufficient documentation

## 2017-04-16 DIAGNOSIS — K7460 Unspecified cirrhosis of liver: Secondary | ICD-10-CM | POA: Insufficient documentation

## 2017-04-16 DIAGNOSIS — Z794 Long term (current) use of insulin: Secondary | ICD-10-CM | POA: Diagnosis not present

## 2017-04-16 HISTORY — PX: RIGHT HEART CATH: CATH118263

## 2017-04-16 LAB — BASIC METABOLIC PANEL
Anion gap: 7 (ref 5–15)
BUN: 10 mg/dL (ref 6–20)
CO2: 23 mmol/L (ref 22–32)
Calcium: 8.6 mg/dL — ABNORMAL LOW (ref 8.9–10.3)
Chloride: 109 mmol/L (ref 101–111)
Creatinine, Ser: 0.8 mg/dL (ref 0.44–1.00)
GFR calc Af Amer: >60 mL/min (ref >60)
GFR calc non Af Amer: >60 mL/min (ref >60)
Glucose, Bld: 169 mg/dL — ABNORMAL HIGH (ref 65–99)
Potassium: 3.9 mmol/L (ref 3.5–5.1)
Sodium: 139 mmol/L (ref 135–145)

## 2017-04-16 LAB — POCT I-STAT 3, VENOUS BLOOD GAS (G3P V)
Acid-base deficit: 1 mmol/L (ref 0.0–2.0)
Bicarbonate: 23.1 mmol/L (ref 20.0–28.0)
Bicarbonate: 23.6 mmol/L (ref 20.0–28.0)
O2 Saturation: 68 %
O2 Saturation: 69 %
TCO2: 24 mmol/L (ref 0–100)
TCO2: 25 mmol/L (ref 0–100)
pCO2, Ven: 33.2 mmHg — ABNORMAL LOW (ref 44.0–60.0)
pCO2, Ven: 33.4 mmHg — ABNORMAL LOW (ref 44.0–60.0)
pH, Ven: 7.451 — ABNORMAL HIGH (ref 7.250–7.430)
pH, Ven: 7.457 — ABNORMAL HIGH (ref 7.250–7.430)
pO2, Ven: 33 mmHg (ref 32.0–45.0)
pO2, Ven: 34 mmHg (ref 32.0–45.0)

## 2017-04-16 LAB — CBC
HCT: 27.7 % — ABNORMAL LOW (ref 36.0–46.0)
Hemoglobin: 8.1 g/dL — ABNORMAL LOW (ref 12.0–15.0)
MCH: 22.8 pg — ABNORMAL LOW (ref 26.0–34.0)
MCHC: 29.2 g/dL — ABNORMAL LOW (ref 30.0–36.0)
MCV: 78 fL (ref 78.0–100.0)
Platelets: DECREASED 10*3/uL (ref 150–400)
RBC: 3.55 MIL/uL — ABNORMAL LOW (ref 3.87–5.11)
RDW: 19.4 % — ABNORMAL HIGH (ref 11.5–15.5)
WBC: 3.6 10*3/uL — ABNORMAL LOW (ref 4.0–10.5)

## 2017-04-16 LAB — PROTIME-INR
INR: 1.62
Prothrombin Time: 19.4 seconds — ABNORMAL HIGH (ref 11.4–15.2)

## 2017-04-16 LAB — GLUCOSE, CAPILLARY
Glucose-Capillary: 166 mg/dL — ABNORMAL HIGH (ref 65–99)
Glucose-Capillary: 149 mg/dL — ABNORMAL HIGH (ref 65–99)

## 2017-04-16 SURGERY — RIGHT HEART CATH
Anesthesia: LOCAL

## 2017-04-16 MED ORDER — ACETAMINOPHEN 325 MG PO TABS: 650.0000 mg | ORAL_TABLET | ORAL | Status: DC | PRN

## 2017-04-16 MED ORDER — HEPARIN (PORCINE) IN NACL 2-0.9 UNIT/ML-% IJ SOLN
INTRAMUSCULAR | Status: AC
  Filled 2017-04-16: qty 500

## 2017-04-16 MED ORDER — ONDANSETRON HCL 4 MG/2ML IJ SOLN: 4.0000 mg | Freq: Four times a day (QID) | INTRAMUSCULAR | Status: DC | PRN

## 2017-04-16 MED ORDER — SODIUM CHLORIDE 0.9 % IV SOLN: 250.0000 mL | INTRAVENOUS | Status: DC | PRN

## 2017-04-16 MED ORDER — SODIUM CHLORIDE 0.9% FLUSH: 3.0000 mL | Freq: Two times a day (BID) | INTRAVENOUS | Status: DC

## 2017-04-16 MED ORDER — SODIUM CHLORIDE 0.9 % IV SOLN
INTRAVENOUS | Status: DC
  Administered 2017-04-16: 08:00:00 via INTRAVENOUS

## 2017-04-16 MED ORDER — SODIUM CHLORIDE 0.9% FLUSH: 3.0000 mL | INTRAVENOUS | Status: DC | PRN

## 2017-04-16 MED ORDER — HEPARIN (PORCINE) IN NACL 2-0.9 UNIT/ML-% IJ SOLN
INTRAMUSCULAR | Status: AC | PRN
  Administered 2017-04-16: 500 mL

## 2017-04-16 MED ORDER — LIDOCAINE HCL 1 % IJ SOLN
INTRAMUSCULAR | Status: AC
  Filled 2017-04-16: qty 20

## 2017-04-16 MED ORDER — LIDOCAINE HCL (PF) 1 % IJ SOLN
INTRAMUSCULAR | Status: DC | PRN
  Administered 2017-04-16: 2 mL

## 2017-04-16 SURGICAL SUPPLY — 7 items
CATH BALLN WEDGE 5F 110CM (CATHETERS) ×2 IMPLANT
GUIDEWIRE .025 260CM (WIRE) ×1 IMPLANT
PACK CARDIAC CATHETERIZATION (CUSTOM PROCEDURE TRAY) ×2 IMPLANT
SHEATH GLIDE SLENDER 4/5FR (SHEATH) ×1 IMPLANT
TRANSDUCER W/STOPCOCK (MISCELLANEOUS) ×2 IMPLANT
TUBING ART PRESS 72  MALE/MALE (TUBING) ×1 IMPLANT
WIRE EMERALD 3MM-J .025X260CM (WIRE) ×1 IMPLANT

## 2017-04-16 NOTE — H&P (Addendum)
Physician History and Physical    Maria [name] MRN: [id number] DOB/AGE: 1956-08-17 61 y.o. Admit date: 04/16/2017  HPI: 61 yo with history of NASH/cirrhosis and progressive dyspnea sent for RHC for evaluation of progressive exertional dyspnea.  CT chest with dilated PA concerning for pulmonary hypertension.   Past Medical History:  Diagnosis Date  . Anemia   . Cirrhosis ([place])   . Diabetes mellitus   . Esophageal bleed, non-variceal   . Eye hemorrhage    Behind left eye  . Fibromyalgia   . Hypertension   . Hypothyroidism   . NAFLD (nonalcoholic fatty liver disease)   . Osteoarthrosis   . Osteoporosis   . PONV (postoperative nausea and vomiting)   . UTI (urinary tract infection) 11/12 end of the month   Patient feels that she passed a kidney stone at that time    Review of systems complete and found to be negative unless listed above   Family History  Problem Relation Age of Onset  . Lung cancer Mother   . Diabetes Father   . Diabetes Sister   . Hypertension Sister   . Hypothyroidism Sister   . Colon cancer Brother   . Diabetes Sister   . Hypothyroidism Brother   . Healthy Daughter   . Obesity Daughter   . Hypertension Daughter     Social History   Social History  . Marital status: Married    Spouse name: N/A  . Number of children: N/A  . Years of education: N/A   Occupational History  . retired     Social History Main Topics  . Smoking status: Never Smoker  . Smokeless tobacco: Never Used  . Alcohol use No  . Drug use: No  . Sexual activity: Not on file   Other Topics Concern  . Not on file   Social History Narrative   Lives with husband      Prescriptions Prior to Admission  Medication Sig Dispense Refill Last Dose  . cholecalciferol (VITAMIN D) 1000 UNITS tablet Take 5,000 Units by mouth 3 (three) times a week.    04/15/2017 at Unknown time  . fluticasone furoate-vilanterol (BREO ELLIPTA) 200-25 MCG/INH AEPB Inhale 1 puff into the lungs  daily. 1 each 3 04/15/2017 at Unknown time  . furosemide (LASIX) 20 MG tablet Take 1 tablet by mouth once daily as needed 90 tablet 0 04/15/2017 at Unknown time  . Insulin Human (INSULIN PUMP) SOLN Inject into the skin daily.   04/15/2017 at Unknown time  . insulin lispro (HUMALOG) 100 UNIT/ML injection Inject into the skin 3 (three) times daily before meals. Rate of 5 units/hour except smaller dose at night per patient. Patient may bolus as needed.   04/15/2017 at Unknown time  . levothyroxine (SYNTHROID, LEVOTHROID) 25 MCG tablet Take 25 mcg by mouth daily.     04/15/2017 at Unknown time  . OXYGEN Inhale 2 L into the lungs every evening.   04/15/2017 at Unknown time  . pantoprazole (PROTONIX) 40 MG tablet Take 1 tablet by mouth once daily 90 tablet 3 04/15/2017 at Unknown time  . PARoxetine (PAXIL) 40 MG tablet Take 40 mg by mouth every morning.   04/15/2017 at Unknown time  . Pediatric Multivitamins-Iron (FLINTSTONES PLUS IRON PO) Take 2 tablets by mouth daily. Patient states that she takes 2 by mouth daily.    04/15/2017 at Unknown time  . potassium chloride SA (K-DUR,KLOR-CON) 20 MEQ tablet Take 1 tablet (20 mEq total) by mouth  daily as needed. Takes when she takes lasix 90 tablet 3 04/15/2017 at Unknown time  . propranolol (INDERAL) 20 MG tablet Take 20 mg by mouth daily. MAY TAKE 2 TIMES DAILY   04/15/2017 at Unknown time    Physical Exam: Blood pressure (!) 158/77, pulse 74, temperature 99.1 F (37.3 C), temperature source Oral, resp. rate 18, height 5' 4"  (1.626 m), weight 240 lb (108.9 kg), SpO2 99 %.  General: NAD, obese Neck: No JVD, no thyromegaly or thyroid nodule.  Lungs: Clear to auscultation bilaterally with normal respiratory effort. CV: Nondisplaced PMI.  Heart regular S1/S2, no S3/S4, no murmur.  No peripheral edema.  No carotid bruit.  Normal pedal pulses.  Abdomen: Soft, nontender, no hepatosplenomegaly, no distention.  Skin: Intact without lesions or rashes.  Neurologic: Alert and  oriented x 3.  Psych: Normal affect. Extremities: No clubbing or cyanosis.  HEENT: Normal.   Labs:   Lab Results  Component Value Date   WBC 3.8 (L) 11/21/2016   HGB 11.2 (L) 11/21/2016   HCT 35.7 (L) 11/21/2016   MCV 79.9 11/21/2016   PLT  11/21/2016    PLATELET CLUMPS NOTED ON SMEAR, COUNT APPEARS DECREASED   No results for input(s): NA, K, CL, CO2, BUN, CREATININE, CALCIUM, PROT, BILITOT, ALKPHOS, ALT, AST, GLUCOSE in the last 168 hours.  Invalid input(s): LABALBU Lab Results  Component Value Date   CKTOTAL 109 08/11/2010   CKMB 2.4 08/11/2010   TROPONINI 0.03        NO INDICATION OF MYOCARDIAL INJURY. 08/11/2010   No results found for: CHOL No results found for: HDL No results found for: LDLCALC No results found for: TRIG No results found for: CHOLHDL No results found for: LDLDIRECT    ASSESSMENT AND PLAN: 61 yo with history of NASH/cirrhosis and progressive dyspnea sent for RHC for evaluation of progressive exertional dyspnea. - Plan for RHC this morning. Will need brachial access and avoid femoral as unable to get a platelet count due to clumping but suspect low.   Signed: Loralie Champagne 04/16/2017, 7:27 AM

## 2017-04-16 NOTE — Discharge Instructions (Signed)
Angiogram, Care After °This sheet gives you information about how to care for yourself after your procedure. Your health care provider may also give you more specific instructions. If you have problems or questions, contact your health care provider. °What can I expect after the procedure? °After the procedure, it is common to have bruising and tenderness at the catheter insertion area. °Follow these instructions at home: °Insertion site care  °· Follow instructions from your health care provider about how to take care of your insertion site. Make sure you: °¨ Wash your hands with soap and water before you change your bandage (dressing). If soap and water are not available, use hand sanitizer. °¨ Change your dressing as told by your health care provider. °¨ Leave stitches (sutures), skin glue, or adhesive strips in place. These skin closures may need to stay in place for 2 weeks or longer. If adhesive strip edges start to loosen and curl up, you may trim the loose edges. Do not remove adhesive strips completely unless your health care provider tells you to do that. °· Do not take baths, swim, or use a hot tub until your health care provider approves. °· You may shower 24-48 hours after the procedure or as told by your health care provider. °¨ Gently wash the site with plain soap and water. °¨ Pat the area dry with a clean towel. °¨ Do not rub the site. This may cause bleeding. °· Do not apply powder or lotion to the site. Keep the site clean and dry. °· Check your insertion site every day for signs of infection. Check for: °¨ Redness, swelling, or pain. °¨ Fluid or blood. °¨ Warmth. °¨ Pus or a bad smell. °Activity  °· Rest as told by your health care provider, usually for 1-2 days. °· Do not lift anything that is heavier than 10 lbs. (4.5 kg) or as told by your health care provider. °· Do not drive for 24 hours if you were given a medicine to help you relax (sedative). °· Do not drive or use heavy machinery while  taking prescription pain medicine. °General instructions  °· Return to your normal activities as told by your health care provider, usually in about a week. Ask your health care provider what activities are safe for you. °· If the catheter site starts bleeding, lie flat and put pressure on the site. If the bleeding does not stop, get help right away. This is a medical emergency. °· Drink enough fluid to keep your urine clear or pale yellow. This helps flush the contrast dye from your body. °· Take over-the-counter and prescription medicines only as told by your health care provider. °· Keep all follow-up visits as told by your health care provider. This is important. °Contact a health care provider if: °· You have a fever or chills. °· You have redness, swelling, or pain around your insertion site. °· You have fluid or blood coming from your insertion site. °· The insertion site feels warm to the touch. °· You have pus or a bad smell coming from your insertion site. °· You have bruising around the insertion site. °· You notice blood collecting in the tissue around the catheter site (hematoma). The hematoma may be painful to the touch. °Get help right away if: °· You have severe pain at the catheter insertion area. °· The catheter insertion area swells very fast. °· The catheter insertion area is bleeding, and the bleeding does not stop when you hold steady pressure on   the area. °· The area near or just beyond the catheter insertion site becomes pale, cool, tingly, or numb. °These symptoms may represent a serious problem that is an emergency. Do not wait to see if the symptoms will go away. Get medical help right away. Call your local emergency services (911 in the [place] Do not drive yourself to the hospital. °Summary °· After the procedure, it is common to have bruising and tenderness at the catheter insertion area. °· After the procedure, it is important to rest and drink plenty of fluids. °· Do not take baths,  swim, or use a hot tub until your health care provider says it is okay to do so. You may shower 24-48 hours after the procedure or as told by your health care provider. °· If the catheter site starts bleeding, lie flat and put pressure on the site. If the bleeding does not stop, get help right away. This is a medical emergency. °This information is not intended to replace advice given to you by your health care provider. Make sure you discuss any questions you have with your health care provider. °Document Released: 03/23/2005 Document Revised: 08/09/2016 Document Reviewed: 08/09/2016 °Elsevier Interactive Patient Education © 2017 Elsevier Inc. ° °

## 2017-04-18 ENCOUNTER — Telehealth (INDEPENDENT_AMBULATORY_CARE_PROVIDER_SITE_OTHER): Payer: Self-pay | Admitting: *Deleted

## 2017-04-18 ENCOUNTER — Encounter: Payer: Self-pay | Admitting: Internal Medicine

## 2017-04-18 ENCOUNTER — Encounter (INDEPENDENT_AMBULATORY_CARE_PROVIDER_SITE_OTHER): Payer: Self-pay | Admitting: Internal Medicine

## 2017-04-18 NOTE — Telephone Encounter (Signed)
had Rt. side cardiac cath Monday, Please see labs done, Hgb 8.1. Still taking [place] vit 2x qd(with iron). Cardiologist changed Lasix 57m qd,K+223m daily, plans to repeat cath after fluid taken care of.   Please let me know if you have any recommendations. Trying to get myself in shape prior to August  EGD. Pulmonologist added Breo inhaler and O2 at bedtime. They did do a sleep study but don't have a report yet.       Dr.Rehman this was sent to [facility] . Review any recommendations let me know.

## 2017-04-18 NOTE — Telephone Encounter (Signed)
CDY- looks like you may have ready her PSG but I can not the actual results  Study was done 03/12/17  Please advise what to tell the pt, as she is emailing wanting recs, thanks!

## 2017-04-18 NOTE — Telephone Encounter (Signed)
This study was done at [facility], with substantial delay getting the report back down for formal reading. I was able to access the raw data. There is no sleep apnea. She did drop her oxygen score at times as low as 88%. Until she returns to a provider here, I suggest she continue wearing oxygen during sleep, as she is apparently doing now.

## 2017-04-19 ENCOUNTER — Encounter (INDEPENDENT_AMBULATORY_CARE_PROVIDER_SITE_OTHER): Payer: Self-pay | Admitting: *Deleted

## 2017-04-19 ENCOUNTER — Other Ambulatory Visit (INDEPENDENT_AMBULATORY_CARE_PROVIDER_SITE_OTHER): Payer: Self-pay | Admitting: *Deleted

## 2017-04-19 DIAGNOSIS — K7460 Unspecified cirrhosis of liver: Secondary | ICD-10-CM

## 2017-04-19 DIAGNOSIS — D508 Other iron deficiency anemias: Secondary | ICD-10-CM

## 2017-04-19 DIAGNOSIS — K7469 Other cirrhosis of liver: Secondary | ICD-10-CM

## 2017-04-19 DIAGNOSIS — I8500 Esophageal varices without bleeding: Secondary | ICD-10-CM

## 2017-04-19 NOTE — Telephone Encounter (Signed)
Patient needs Hemoccult. She needs serum iron TIBC ferritin B12 level and repeat H&H next week.

## 2017-04-19 NOTE — Telephone Encounter (Signed)
Patient was called and given Dr.Rehman's instruction. She will come to [facility] next Thursday. Hemoccult card being mailed to patient.

## 2017-04-21 NOTE — Procedures (Signed)
  Patient Name: Maria [name], Maria [name] Date: 03/12/2017 Gender: Female D.O.B: 1956/01/16 Age (years): 5 Referring Provider: Magdalen Spatz NP Height (inches): 64 Interpreting Physician: Baird Lyons MD, ABSM Weight (lbs): 245 RPSGT: Rosebud Poles BMI: 42 MRN: [id number] Neck Size: 16.00 CLINICAL INFORMATION Sleep Study Type: NPSG  Indication for sleep study: OSA  Epworth Sleepiness Score: 10  SLEEP STUDY TECHNIQUE As per the AASM Manual for the Scoring of Sleep and Associated Events v2.3 (April 2016) with a hypopnea requiring 4% desaturations.  The channels recorded and monitored were frontal, central and occipital EEG, electrooculogram (EOG), submentalis EMG (chin), nasal and oral airflow, thoracic and abdominal wall motion, anterior tibialis EMG, snore microphone, electrocardiogram, and pulse oximetry.  MEDICATIONS Medications self-administered by patient taken the night of the study : none reported  SLEEP ARCHITECTURE The study was initiated at 9:58:24 PM and ended at 4:44:43 AM.  Sleep onset time was 66.3 minutes and the sleep efficiency was 76.7%. The total sleep time was 311.5 minutes.  Stage REM latency was N/A minutes.  The patient spent 3.37% of the night in stage N1 sleep, 78.01% in stage N2 sleep, 18.62% in stage N3 and 0.00% in REM.  Alpha intrusion was absent.  Supine sleep was 23.10%.  RESPIRATORY PARAMETERS The overall apnea/hypopnea index (AHI) was 0.0 per hour. There were 0 total apneas, including 0 obstructive, 0 central and 0 mixed apneas. There were 0 hypopneas and 1 RERAs.  The AHI during Stage REM sleep was N/A per hour.  AHI while supine was 0.0 per hour.  The mean oxygen saturation was 91.21%. The minimum SpO2 during sleep was 88.00%.  Loud snoring was noted during this study.  CARDIAC DATA The 2 lead EKG demonstrated sinus rhythm. The mean heart rate was N/A beats per minute. Other EKG findings include: PVCs.  LEG MOVEMENT DATA The  total PLMS were 98 with a resulting PLMS index of 18.87. Associated arousal with leg movement index was 3.5 .  IMPRESSIONS - No significant obstructive sleep apnea occurred during this study (AHI = 0.0/h). - No significant central sleep apnea occurred during this study (CAI = 0.0/h). - Mild oxygen desaturation was noted during this study (Min O2 = 88.00%, Mean 91.2%). - The patient snored with Loud snoring volume. - EKG findings include PVCs. - Mild periodic limb movements of sleep occurred during the study. No significant associated arousals.  DIAGNOSIS - Periodic Limb Movement Syndrome (327.51 [G47.61 ICD-10]) - Primary Snoring (786.09 [R06.83 ICD-10])  RECOMMENDATIONS - Consider options for reducing snoring- nasal sprays, chin strap, sleep off  back, ENT - Consider specific therapy for luimb movement sleep disorder, such as Requip or Mirapex, if appropriate.. - Sleep hygiene should be reviewed to assess factors that may improve sleep quality. - Weight management and regular exercise should be initiated or continued if appropriate.  [Electronically signed] 04/21/2017 12:32 PM  Baird Lyons MD, [place], American Board of Sleep Medicine   NPI: [id number]  [place], [facility]  ELECTRONICALLY SIGNED ON:  04/21/2017, 12:27 PM [place] PH: [phone]   FX: [phone] [place]

## 2017-04-23 ENCOUNTER — Telehealth: Payer: Self-pay | Admitting: Acute Care

## 2017-04-23 NOTE — Telephone Encounter (Signed)
Please call patient and let her know she does not have sleep apnea per the sleep study she recently completed. Please have her come in for an appointment if she would like to further discuss these options for treatment noted below. Thanks so much.  No significant obstructive sleep apnea occurred during this study (AHI = 0.0/h). - No significant central sleep apnea occurred during this study (CAI = 0.0/h). - Mild oxygen desaturation was noted during this study (Min O2 = 88.00%, Mean 91.2%). - The patient snored with Loud snoring volume. - - Mild periodic limb movements of sleep occurred during the study. No significant associated arousals.  DIAGNOSIS - Periodic Limb Movement Syndrome (327.51 [G47.61 ICD-10]) - Primary Snoring (786.09 [R06.83 ICD-10])  RECOMMENDATIONS - Consider options for reducing snoring- nasal sprays, chin strap, sleep off  back, ENT referral - Consider specific therapy for limb movement sleep disorder, such as Requip or Mirapex, if appropriate.. - Improvement of sleep hygiene  - Weight management and regular exercise should be initiated or continued if appropriate.

## 2017-04-24 ENCOUNTER — Ambulatory Visit (HOSPITAL_COMMUNITY)
Admission: RE | Admit: 2017-04-24 | Discharge: 2017-04-24 | Disposition: A | Discharge status: Home / Self Care | Payer: PPO | Source: Ambulatory Visit | Attending: Cardiology | Admitting: Cardiology

## 2017-04-24 ENCOUNTER — Encounter (HOSPITAL_COMMUNITY): Payer: Self-pay | Admitting: Cardiology

## 2017-04-24 VITALS — BP 127/53 | HR 76 | Wt 246.2 lb

## 2017-04-24 DIAGNOSIS — D696 Thrombocytopenia, unspecified: Secondary | ICD-10-CM | POA: Insufficient documentation

## 2017-04-24 DIAGNOSIS — I110 Hypertensive heart disease with heart failure: Secondary | ICD-10-CM | POA: Diagnosis not present

## 2017-04-24 DIAGNOSIS — I5032 Chronic diastolic (congestive) heart failure: Secondary | ICD-10-CM | POA: Insufficient documentation

## 2017-04-24 DIAGNOSIS — Z801 Family history of malignant neoplasm of trachea, bronchus and lung: Secondary | ICD-10-CM | POA: Diagnosis not present

## 2017-04-24 DIAGNOSIS — I2510 Atherosclerotic heart disease of native coronary artery without angina pectoris: Secondary | ICD-10-CM | POA: Insufficient documentation

## 2017-04-24 DIAGNOSIS — Z794 Long term (current) use of insulin: Secondary | ICD-10-CM | POA: Insufficient documentation

## 2017-04-24 DIAGNOSIS — Z800 Family history of malignant neoplasm of digestive organs: Secondary | ICD-10-CM | POA: Insufficient documentation

## 2017-04-24 DIAGNOSIS — D649 Anemia, unspecified: Secondary | ICD-10-CM | POA: Diagnosis not present

## 2017-04-24 DIAGNOSIS — Z9981 Dependence on supplemental oxygen: Secondary | ICD-10-CM | POA: Diagnosis not present

## 2017-04-24 DIAGNOSIS — I272 Pulmonary hypertension, unspecified: Secondary | ICD-10-CM

## 2017-04-24 DIAGNOSIS — K7460 Unspecified cirrhosis of liver: Secondary | ICD-10-CM | POA: Diagnosis not present

## 2017-04-24 DIAGNOSIS — R0602 Shortness of breath: Secondary | ICD-10-CM | POA: Diagnosis not present

## 2017-04-24 DIAGNOSIS — K7581 Nonalcoholic steatohepatitis (NASH): Secondary | ICD-10-CM | POA: Diagnosis not present

## 2017-04-24 DIAGNOSIS — Z833 Family history of diabetes mellitus: Secondary | ICD-10-CM | POA: Insufficient documentation

## 2017-04-24 DIAGNOSIS — E7849 Other hyperlipidemia: Secondary | ICD-10-CM

## 2017-04-24 DIAGNOSIS — E784 Other hyperlipidemia: Secondary | ICD-10-CM | POA: Diagnosis not present

## 2017-04-24 DIAGNOSIS — Z9641 Presence of insulin pump (external) (internal): Secondary | ICD-10-CM | POA: Diagnosis not present

## 2017-04-24 DIAGNOSIS — R161 Splenomegaly, not elsewhere classified: Secondary | ICD-10-CM | POA: Diagnosis not present

## 2017-04-24 DIAGNOSIS — E7800 Pure hypercholesterolemia, unspecified: Secondary | ICD-10-CM | POA: Diagnosis not present

## 2017-04-24 DIAGNOSIS — Z8249 Family history of ischemic heart disease and other diseases of the circulatory system: Secondary | ICD-10-CM | POA: Insufficient documentation

## 2017-04-24 DIAGNOSIS — R945 Abnormal results of liver function studies: Secondary | ICD-10-CM | POA: Diagnosis not present

## 2017-04-24 DIAGNOSIS — E1165 Type 2 diabetes mellitus with hyperglycemia: Secondary | ICD-10-CM | POA: Diagnosis not present

## 2017-04-24 DIAGNOSIS — I10 Essential (primary) hypertension: Secondary | ICD-10-CM | POA: Diagnosis not present

## 2017-04-24 DIAGNOSIS — E039 Hypothyroidism, unspecified: Secondary | ICD-10-CM | POA: Diagnosis not present

## 2017-04-24 LAB — CBC
HCT: 28.3 % — ABNORMAL LOW (ref 36.0–46.0)
Hemoglobin: 8.3 g/dL — ABNORMAL LOW (ref 12.0–15.0)
MCH: 23.2 pg — ABNORMAL LOW (ref 26.0–34.0)
MCHC: 29.3 g/dL — ABNORMAL LOW (ref 30.0–36.0)
MCV: 79.3 fL (ref 78.0–100.0)
Platelets: ADEQUATE 10*3/uL (ref 150–400)
RBC: 3.57 MIL/uL — ABNORMAL LOW (ref 3.87–5.11)
RDW: 19.7 % — ABNORMAL HIGH (ref 11.5–15.5)
WBC: 4.7 10*3/uL (ref 4.0–10.5)

## 2017-04-24 LAB — BASIC METABOLIC PANEL
Anion gap: 8 (ref 5–15)
BUN: 8 mg/dL (ref 6–20)
CO2: 24 mmol/L (ref 22–32)
Calcium: 8.7 mg/dL — ABNORMAL LOW (ref 8.9–10.3)
Chloride: 108 mmol/L (ref 101–111)
Creatinine, Ser: 0.82 mg/dL (ref 0.44–1.00)
GFR calc Af Amer: >60 mL/min (ref >60)
GFR calc non Af Amer: >60 mL/min (ref >60)
Glucose, Bld: 225 mg/dL — ABNORMAL HIGH (ref 65–99)
Potassium: 3.6 mmol/L (ref 3.5–5.1)
Sodium: 140 mmol/L (ref 135–145)

## 2017-04-24 LAB — LIPID PANEL
Cholesterol: 140 mg/dL (ref 0–200)
HDL: 43 mg/dL (ref >40)
LDL Cholesterol: 72 mg/dL (ref 0–99)
Total CHOL/HDL Ratio: 3.3 RATIO
Triglycerides: 126 mg/dL (ref <150)
VLDL: 25 mg/dL (ref 0–40)

## 2017-04-24 LAB — BRAIN NATRIURETIC PEPTIDE: B Natriuretic Peptide: 59.1 pg/mL (ref 0.0–100.0)

## 2017-04-24 MED ORDER — FUROSEMIDE 40 MG PO TABS: ORAL_TABLET | ORAL | 3 refills | Status: DC

## 2017-04-24 NOTE — Patient Instructions (Signed)
Increase Furosemide to 40 mg Twice daily FOR 3 DAYS ONLY, then take 40 mg (1 tab) in AM and 20 mg (1/2 tab) in PM  Labs today  Labs in 10 days  Your physician recommends that you schedule a follow-up appointment in: 1 month

## 2017-04-26 ENCOUNTER — Telehealth (INDEPENDENT_AMBULATORY_CARE_PROVIDER_SITE_OTHER): Payer: Self-pay | Admitting: *Deleted

## 2017-04-26 DIAGNOSIS — D508 Other iron deficiency anemias: Secondary | ICD-10-CM | POA: Diagnosis not present

## 2017-04-26 DIAGNOSIS — K7469 Other cirrhosis of liver: Secondary | ICD-10-CM | POA: Diagnosis not present

## 2017-04-26 DIAGNOSIS — K7460 Unspecified cirrhosis of liver: Secondary | ICD-10-CM | POA: Diagnosis not present

## 2017-04-26 DIAGNOSIS — I8500 Esophageal varices without bleeding: Secondary | ICD-10-CM | POA: Diagnosis not present

## 2017-04-26 DIAGNOSIS — D696 Thrombocytopenia, unspecified: Secondary | ICD-10-CM | POA: Diagnosis not present

## 2017-04-26 NOTE — Telephone Encounter (Signed)
   Diagnosis:    Result(s)   Card 1:  Negative:           Completed by: Thomas Hoff , LPN   HEMOCCULT SENSA DEVELOPER: LOT#:  [id number] EXPIRATION DATE: 2020-10   HEMOCCULT SENSA CARD:  LOT#:  [id number] EXPIRATION DATE: 03/20   CARD CONTROL RESULTS:  POSITIVE: Positive NEGATIVE: Negative    ADDITIONAL COMMENTS: Patient was called and given her result.

## 2017-04-26 NOTE — Progress Notes (Signed)
PCP: Dr. Woody Seller HF Cardiology: Dr. Aundra Dubin  61 yo sent for CHF clinic evaluation due to progressive dyspnea.  Patient has been having exertional dypsnea for a year or so.  She had been short of breath walking about 100 yards.  Echo in 3/18 showed mild LVH and mild MR with normal EF.  High resolution CT lungs in 5/18 did not have evidence for interstitial lung disease.   She additionally has history of NASH with cirrhosis and variceal bleeding as well as splenomegaly, followed by Dr. Laural Golden.   She was sent for right heart cath as there was worry for possible portopulmonary hypertension.  RHC showed elevated right and left heart filling pressures with pulmonary venous hypertension.  She had been taking Lasix prn.  After the cath, I increased the Lasix to 40 mg daily.   She says that she is breathing better.  She can now pull 2 trash cans to the street without dyspnea.  No orthopnea/PND. No chest pain.  No BRBPR/melena.   ECG (personally reviewed, 7/18): NSR, LAFB, poor RWP  Labs (7/18): K 3.9, creatinine 0.8, hgb 8.1                                                                                                         PMH:  1. NASH with cirrhosis: H/o variceal bleeding.  2. Splenomegaly related to cirrhosis.  3. Sleep study 6/8 showed no significant OSA.  4. High resolution CT chest (5/18): No interstitial lung disease, +calcium in coronaries.  5. Chronic diastolic CHF:  - Echo (8/84): EF 60-65%, mild LVH, mild MR.  Negative bubble study.  - RHC (7/18): mean RA 16, PA 64/25, mean PCWP 21, CI 4.16, PVR 2.6 WU.  6. Anemia 7. Thrombocytopenia (likely related to splenomegaly).   Social History   Social History  . Marital status: Married    Spouse name: N/A  . Number of children: N/A  . Years of education: N/A   Occupational History  . retired     Social History Main Topics  . Smoking status: Never Smoker  . Smokeless tobacco: Never Used  . Alcohol use No  . Drug use: No  . Sexual  activity: Not on file   Other Topics Concern  . Not on file   Social History Narrative   Lives with husband    Family History  Problem Relation Age of Onset  . Lung cancer Mother   . Diabetes Father   . Diabetes Sister   . Hypertension Sister   . Hypothyroidism Sister   . Colon cancer Brother   . Diabetes Sister   . Hypothyroidism Brother   . Healthy Daughter   . Obesity Daughter   . Hypertension Daughter    ROS: All systems reviewed and negatie except as per HPI.   Current Outpatient Prescriptions  Medication Sig Dispense Refill  . cholecalciferol (VITAMIN D) 1000 UNITS tablet Take 5,000 Units by mouth 3 (three) times a week.     . fluticasone furoate-vilanterol (BREO ELLIPTA) 200-25 MCG/INH AEPB Inhale 1 puff into the lungs  daily. 1 each 3  . furosemide (LASIX) 40 MG tablet Take 1 tab in AM and 1/2 tab in PM 45 tablet 3  . Insulin Human (INSULIN PUMP) SOLN Inject into the skin daily.    . insulin lispro (HUMALOG) 100 UNIT/ML injection Inject into the skin 3 (three) times daily before meals. Rate of 5 units/hour except smaller dose at night per patient. Patient may bolus as needed.    Marland Kitchen levothyroxine (SYNTHROID, LEVOTHROID) 25 MCG tablet Take 25 mcg by mouth daily.      . OXYGEN Inhale 2 L into the lungs every evening.    . pantoprazole (PROTONIX) 40 MG tablet Take 1 tablet by mouth once daily 90 tablet 3  . PARoxetine (PAXIL) 40 MG tablet Take 40 mg by mouth every morning.    . Pediatric Multivitamins-Iron (FLINTSTONES PLUS IRON PO) Take 2 tablets by mouth daily. Patient states that she takes 2 by mouth daily.     . potassium chloride SA (K-DUR,KLOR-CON) 20 MEQ tablet Take 20 mEq by mouth daily.    . propranolol (INDERAL) 20 MG tablet Take 20 mg by mouth daily. MAY TAKE 2 TIMES DAILY     No current facility-administered medications for this encounter.    BP (!) 127/53   Pulse 76   Wt 246 lb 4 oz (111.7 kg)   SpO2 100%   BMI 42.27 kg/m  General: NAD Neck: JVP 8-9 cm,  no thyromegaly or thyroid nodule.  Lungs: Clear to auscultation bilaterally with normal respiratory effort. CV: Nondisplaced PMI.  Heart regular S1/S2, no S3/S4, 2/6 SEM RUSB.  1+ edema to knees bilaterally.  No carotid bruit.  Normal pedal pulses.  Abdomen: Soft, nontender, no hepatosplenomegaly, no distention.  Skin: Intact without lesions or rashes.  Neurologic: Alert and oriented x 3.  Psych: Normal affect. Extremities: No clubbing or cyanosis.  HEENT: Normal.   Assessment/Plan: 1. Chronic diastolic CHF: RHC in 5/95 is suggestive of significant diastolic CHF.  EF was normal by echo.  She remains volume overloaded by exam today.  NYHA class II symptoms.  - Increase Lasix to 40 mg bid x 3 days then 40 qam/20 qpm.  - BMET/BNP today and repeat in 10 days.   2. Pulmonary hypertension: Patient is at risk for portopulmonary hypertension.   However, [place] in 7/18 showed pulmonary venous hypertension (group 2).   - After full diuresis, I will repeat RHC to reassess for pulmonary hypertension.  3. Cirrhosis: NASH with splenomegaly, thrombocytopenia.  4. CAD: Coronary calcium on CT chest in 5/18.  No chest pain.  - In absence of chest pain, would not do stress test or cath => poor candidate for invasive cardiac evaluation and stent placement with very low platelets.  - Check lipids today.  She should be on a statin, will start based on LDL level.  5. Anemia: Followed by her GI MD.  I will get CBC today.  6. Thrombocytopenia: Likely related to cirrhosis and splenomegaly.   Followup in 1 month.   Loralie Champagne 04/26/2017

## 2017-04-27 LAB — IRON AND TIBC
%SAT: 8 % — ABNORMAL LOW (ref 11–50)
Iron: 34 ug/dL — ABNORMAL LOW (ref 45–160)
TIBC: 421 ug/dL (ref 250–450)
UIBC: 387 ug/dL

## 2017-04-27 LAB — HEMOGLOBIN AND HEMATOCRIT, BLOOD
HCT: 28.8 % — ABNORMAL LOW (ref 35.0–45.0)
Hemoglobin: 8.6 g/dL — ABNORMAL LOW (ref 11.7–15.5)

## 2017-04-27 LAB — VITAMIN B12: Vitamin B-12: 379 pg/mL (ref 200–1100)

## 2017-04-27 LAB — FERRITIN: Ferritin: 4 ng/mL — ABNORMAL LOW (ref 20–288)

## 2017-04-27 NOTE — Telephone Encounter (Signed)
Pt aware of results/recs. Scheduled to see SG on 8/30 at 12:00.  Nothing further needed.

## 2017-04-27 NOTE — Telephone Encounter (Signed)
Patient is returning phone call.  °

## 2017-04-27 NOTE — Telephone Encounter (Signed)
lmtcb x1 for pt. 

## 2017-04-30 ENCOUNTER — Other Ambulatory Visit (INDEPENDENT_AMBULATORY_CARE_PROVIDER_SITE_OTHER): Payer: Self-pay | Admitting: *Deleted

## 2017-04-30 ENCOUNTER — Encounter (INDEPENDENT_AMBULATORY_CARE_PROVIDER_SITE_OTHER): Payer: Self-pay | Admitting: *Deleted

## 2017-04-30 DIAGNOSIS — D649 Anemia, unspecified: Secondary | ICD-10-CM

## 2017-04-30 DIAGNOSIS — R790 Abnormal level of blood mineral: Secondary | ICD-10-CM

## 2017-05-01 ENCOUNTER — Other Ambulatory Visit (HOSPITAL_COMMUNITY): Payer: Self-pay | Admitting: *Deleted

## 2017-05-01 ENCOUNTER — Telehealth (HOSPITAL_COMMUNITY): Payer: Self-pay | Admitting: *Deleted

## 2017-05-01 MED ORDER — ROSUVASTATIN CALCIUM 5 MG PO TABS: 5.0000 mg | ORAL_TABLET | ORAL | 3 refills | Status: DC

## 2017-05-01 MED ORDER — FUROSEMIDE 40 MG PO TABS: ORAL_TABLET | ORAL | 3 refills | Status: DC

## 2017-05-01 NOTE — Telephone Encounter (Signed)
-----   Message from [name], MD sent at 04/24/2017  5:22 PM EDT ----- Lipids are good.  Has known coronary disease, would have her start Crestor 5 mg every other day.  Lipids/LFTs in 2 months.  Hemoglobin is stably low, followup with her GI MD.

## 2017-05-01 NOTE — Telephone Encounter (Signed)
Notes recorded by Scarlette Calico, RN on 05/01/2017 at 2:39 PM EDT Spoke w/pt, she is aware and agreeable, new rx sent in ------  Notes recorded by Harvie Junior, CMA on 04/25/2017 at 1:19 PM EDT No answer no VM will try patient again later ------  Notes recorded by [name], MD on 04/24/2017 at 5:22 PM EDT Lipids are good. Has known coronary disease, would have her start Crestor 5 mg every other day. Lipids/LFTs in 2 months. Hemoglobin is stably low, followup with her GI MD.

## 2017-05-02 ENCOUNTER — Encounter (HOSPITAL_COMMUNITY): Payer: Self-pay | Admitting: *Deleted

## 2017-05-02 ENCOUNTER — Encounter (HOSPITAL_COMMUNITY)
Admission: RE | Disposition: A | Discharge status: Home / Self Care | Payer: Self-pay | Source: Ambulatory Visit | Attending: Internal Medicine

## 2017-05-02 ENCOUNTER — Ambulatory Visit (HOSPITAL_COMMUNITY)
Admission: RE | Admit: 2017-05-02 | Discharge: 2017-05-02 | Disposition: A | Discharge status: Home / Self Care | Payer: PPO | Source: Ambulatory Visit | Attending: Internal Medicine | Admitting: Internal Medicine

## 2017-05-02 DIAGNOSIS — D509 Iron deficiency anemia, unspecified: Secondary | ICD-10-CM | POA: Diagnosis not present

## 2017-05-02 DIAGNOSIS — Z87442 Personal history of urinary calculi: Secondary | ICD-10-CM | POA: Insufficient documentation

## 2017-05-02 DIAGNOSIS — K228 Other specified diseases of esophagus: Secondary | ICD-10-CM | POA: Diagnosis not present

## 2017-05-02 DIAGNOSIS — M810 Age-related osteoporosis without current pathological fracture: Secondary | ICD-10-CM | POA: Insufficient documentation

## 2017-05-02 DIAGNOSIS — Z90710 Acquired absence of both cervix and uterus: Secondary | ICD-10-CM | POA: Diagnosis not present

## 2017-05-02 DIAGNOSIS — Z8249 Family history of ischemic heart disease and other diseases of the circulatory system: Secondary | ICD-10-CM | POA: Insufficient documentation

## 2017-05-02 DIAGNOSIS — K7581 Nonalcoholic steatohepatitis (NASH): Secondary | ICD-10-CM | POA: Diagnosis not present

## 2017-05-02 DIAGNOSIS — Z79899 Other long term (current) drug therapy: Secondary | ICD-10-CM | POA: Insufficient documentation

## 2017-05-02 DIAGNOSIS — I8500 Esophageal varices without bleeding: Secondary | ICD-10-CM | POA: Insufficient documentation

## 2017-05-02 DIAGNOSIS — Z8349 Family history of other endocrine, nutritional and metabolic diseases: Secondary | ICD-10-CM | POA: Insufficient documentation

## 2017-05-02 DIAGNOSIS — E039 Hypothyroidism, unspecified: Secondary | ICD-10-CM | POA: Diagnosis not present

## 2017-05-02 DIAGNOSIS — Z09 Encounter for follow-up examination after completed treatment for conditions other than malignant neoplasm: Secondary | ICD-10-CM | POA: Diagnosis not present

## 2017-05-02 DIAGNOSIS — I509 Heart failure, unspecified: Secondary | ICD-10-CM | POA: Diagnosis not present

## 2017-05-02 DIAGNOSIS — E7800 Pure hypercholesterolemia, unspecified: Secondary | ICD-10-CM | POA: Insufficient documentation

## 2017-05-02 DIAGNOSIS — Z9049 Acquired absence of other specified parts of digestive tract: Secondary | ICD-10-CM | POA: Insufficient documentation

## 2017-05-02 DIAGNOSIS — Z800 Family history of malignant neoplasm of digestive organs: Secondary | ICD-10-CM | POA: Insufficient documentation

## 2017-05-02 DIAGNOSIS — M797 Fibromyalgia: Secondary | ICD-10-CM | POA: Insufficient documentation

## 2017-05-02 DIAGNOSIS — Z801 Family history of malignant neoplasm of trachea, bronchus and lung: Secondary | ICD-10-CM | POA: Diagnosis not present

## 2017-05-02 DIAGNOSIS — K31819 Angiodysplasia of stomach and duodenum without bleeding: Secondary | ICD-10-CM | POA: Insufficient documentation

## 2017-05-02 DIAGNOSIS — K7469 Other cirrhosis of liver: Secondary | ICD-10-CM | POA: Diagnosis not present

## 2017-05-02 DIAGNOSIS — Z794 Long term (current) use of insulin: Secondary | ICD-10-CM | POA: Diagnosis not present

## 2017-05-02 DIAGNOSIS — M1990 Unspecified osteoarthritis, unspecified site: Secondary | ICD-10-CM | POA: Insufficient documentation

## 2017-05-02 DIAGNOSIS — Z833 Family history of diabetes mellitus: Secondary | ICD-10-CM | POA: Insufficient documentation

## 2017-05-02 DIAGNOSIS — K766 Portal hypertension: Secondary | ICD-10-CM | POA: Insufficient documentation

## 2017-05-02 DIAGNOSIS — K3189 Other diseases of stomach and duodenum: Secondary | ICD-10-CM

## 2017-05-02 DIAGNOSIS — E119 Type 2 diabetes mellitus without complications: Secondary | ICD-10-CM | POA: Diagnosis not present

## 2017-05-02 DIAGNOSIS — F329 Major depressive disorder, single episode, unspecified: Secondary | ICD-10-CM | POA: Diagnosis not present

## 2017-05-02 DIAGNOSIS — I110 Hypertensive heart disease with heart failure: Secondary | ICD-10-CM | POA: Diagnosis not present

## 2017-05-02 DIAGNOSIS — I8510 Secondary esophageal varices without bleeding: Secondary | ICD-10-CM | POA: Diagnosis present

## 2017-05-02 HISTORY — DX: Major depressive disorder, single episode, unspecified: F32.9

## 2017-05-02 HISTORY — PX: ESOPHAGOGASTRODUODENOSCOPY: SHX5428

## 2017-05-02 HISTORY — PX: ESOPHAGEAL BANDING: SHX5518

## 2017-05-02 HISTORY — DX: Pure hypercholesterolemia, unspecified: E78.00

## 2017-05-02 HISTORY — DX: Heart failure, unspecified: I50.9

## 2017-05-02 HISTORY — DX: Depression, unspecified: F32.A

## 2017-05-02 LAB — GLUCOSE, CAPILLARY: Glucose-Capillary: 138 mg/dL — ABNORMAL HIGH (ref 65–99)

## 2017-05-02 SURGERY — EGD (ESOPHAGOGASTRODUODENOSCOPY)
Anesthesia: Moderate Sedation

## 2017-05-02 MED ORDER — MEPERIDINE HCL 50 MG/ML IJ SOLN
INTRAMUSCULAR | Status: DC | PRN
  Administered 2017-05-02 (×2): 25 mg via INTRAVENOUS

## 2017-05-02 MED ORDER — MIDAZOLAM HCL 5 MG/5ML IJ SOLN
INTRAMUSCULAR | Status: DC | PRN
  Administered 2017-05-02 (×4): 2 mg via INTRAVENOUS

## 2017-05-02 MED ORDER — LIDOCAINE VISCOUS 2 % MT SOLN
OROMUCOSAL | Status: AC
  Filled 2017-05-02: qty 15

## 2017-05-02 MED ORDER — SODIUM CHLORIDE 0.9 % IV SOLN
INTRAVENOUS | Status: DC
  Administered 2017-05-02: 10:00:00 via INTRAVENOUS

## 2017-05-02 MED ORDER — STERILE WATER FOR IRRIGATION IR SOLN
Status: DC | PRN
  Administered 2017-05-02: 2.5 mL

## 2017-05-02 MED ORDER — LIDOCAINE VISCOUS 2 % MT SOLN
OROMUCOSAL | Status: DC | PRN
  Administered 2017-05-02: 1 via OROMUCOSAL

## 2017-05-02 MED ORDER — MEPERIDINE HCL 50 MG/ML IJ SOLN
INTRAMUSCULAR | Status: AC
  Filled 2017-05-02: qty 1

## 2017-05-02 MED ORDER — MIDAZOLAM HCL 5 MG/5ML IJ SOLN
INTRAMUSCULAR | Status: AC
  Filled 2017-05-02: qty 10

## 2017-05-02 NOTE — Discharge Instructions (Signed)
Resume usual medications and diet. No driving for 24 hours. H&H to be checked in 2 weeks. Office visit in 3 months.      Esophagogastroduodenoscopy, Care After Refer to this sheet in the next few weeks. These instructions provide you with information about caring for yourself after your procedure. Your health care provider may also give you more specific instructions. Your treatment has been planned according to current medical practices, but problems sometimes occur. Call your health care provider if you have any problems or questions after your procedure. What can I expect after the procedure? After the procedure, it is common to have:  A sore throat.  Nausea.  Bloating.  Dizziness.  Fatigue.  Follow these instructions at home:  Do not eat or drink anything until the numbing medicine (local anesthetic) has worn off and your gag reflex has returned. You will know that the local anesthetic has worn off when you can swallow comfortably.  Do not drive for 24 hours if you received a medicine to help you relax (sedative).  If your health care provider took a tissue sample for testing during the procedure, make sure to get your test results. This is your responsibility. Ask your health care provider or the department performing the test when your results will be ready.  Keep all follow-up visits as told by your health care provider. This is important. Contact a health care provider if:  You cannot stop coughing.  You are not urinating.  You are urinating less than usual. Get help right away if:  You have trouble swallowing.  You cannot eat or drink.  You have throat or chest pain that gets worse.  You are dizzy or light-headed.  You faint.  You have nausea or vomiting.  You have chills.  You have a fever.  You have severe abdominal pain.  You have black, tarry, or bloody stools. This information is not intended to replace advice given to you by your health care  provider. Make sure you discuss any questions you have with your health care provider. Document Released: 08/21/2012 Document Revised: 02/10/2016 Document Reviewed: 07/29/2015 Elsevier Interactive Patient Education  [employer].

## 2017-05-02 NOTE — H&P (Signed)
Maria [name] is an 61 y.o. female.   Chief Complaint: Patient is here for EGD and possible esophageal variceal banding. HPI: Patient is 61 year old Caucasian female was cirrhosis secondary to NASH complicated by esophageal varices and she has blood twice. She has been banded on multiple occasions in the past but not in the last 4 years. She is here for EGD with therapeutic intention. She also has gave. She has not experienced melena. She is on Flintstones chewables with iron and switch to ferrous sulfate. She states her shortness of breath and has improved since she lost 11 pounds(diuresis). She had right calf 2 weeks ago and there was a question of mild pulmonary hypertension. Dr. Algernon Huxley is planning to repeat this study once fluids status is improved.  Past Medical History:  Diagnosis Date  . Anemia   . CHF (congestive heart failure) ([place])   . Cirrhosis ([place])   . Depression   . Diabetes mellitus   . Esophageal bleed, non-variceal   . Eye hemorrhage    Behind left eye  . Fibromyalgia   . Hypercholesteremia   . Hypertension   . Hypothyroidism   . NAFLD (nonalcoholic fatty liver disease)   . Osteoarthrosis   . Osteoporosis   . PONV (postoperative nausea and vomiting)   . UTI (urinary tract infection) 11/12 end of the month   Patient feels that she passed a kidney stone at that time    Past Surgical History:  Procedure Laterality Date  . APPENDECTOMY  1980  . BILATERAL SALPINGOOPHORECTOMY    . CHOLECYSTECTOMY    . COLONOSCOPY  03/15/2011  . COLONOSCOPY N/A 03/24/2016   Procedure: COLONOSCOPY;  Surgeon: [name], MD;  [facility];  Service: Endoscopy;  Laterality: N/A;  855  . ESOPHAGEAL BANDING  04/24/2012   Procedure: ESOPHAGEAL BANDING;  Surgeon: [name], MD;  [facility];  Service: Endoscopy;  Laterality: N/A;  . Esophageal BANDING  08/03/2012   [facility] in [place] , [place]  09/24/2012   Procedure:  ESOPHAGEAL BANDING;  Surgeon: [name], MD;  Location: [facility];  Service: Endoscopy;  Laterality: N/A;  Banding x 3  . ESOPHAGEAL BANDING N/A 01/29/2013   Procedure: ESOPHAGEAL BANDING;  Surgeon: [name], MD;  [facility];  Service: Endoscopy;  Laterality: N/A;  . ESOPHAGEAL BANDING N/A 06/19/2014   Procedure: ESOPHAGEAL BANDING;  Surgeon: [name], MD;  [facility];  Service: Endoscopy;  Laterality: N/A;  . ESOPHAGEAL BANDING N/A 01/05/2016   Procedure: ESOPHAGEAL BANDING;  Surgeon: [name], MD;  [facility];  Service: Endoscopy;  Laterality: N/A;  . ESOPHAGEAL BANDING N/A 08/03/2016   Procedure: ESOPHAGEAL BANDING;  Surgeon: [name], MD;  [facility];  Service: Endoscopy;  Laterality: N/A;  . ESOPHAGOGASTRODUODENOSCOPY  04/24/2012   Procedure: ESOPHAGOGASTRODUODENOSCOPY (EGD);  Surgeon: [name], MD;  [facility];  Service: Endoscopy;  Laterality: N/A;  300  . ESOPHAGOGASTRODUODENOSCOPY N/A 01/29/2013   Procedure: ESOPHAGOGASTRODUODENOSCOPY (EGD);  Surgeon: [name], MD;  [facility];  Service: Endoscopy;  Laterality: N/A;  1200  . ESOPHAGOGASTRODUODENOSCOPY N/A 05/22/2013   Procedure: ESOPHAGOGASTRODUODENOSCOPY (EGD);  Surgeon: [name], MD;  [facility];  Service: Endoscopy;  Laterality: N/A;  1:55  . ESOPHAGOGASTRODUODENOSCOPY N/A 12/31/2013   Procedure: ESOPHAGOGASTRODUODENOSCOPY (EGD);  Surgeon: [name], MD;  [facility];  Service: Endoscopy;  Laterality: N/A;  1200  . ESOPHAGOGASTRODUODENOSCOPY N/A 06/19/2014   Procedure: ESOPHAGOGASTRODUODENOSCOPY (EGD);  Surgeon: [name], MD;  [facility];  Service: Endoscopy;  Laterality: N/A;  1055  . ESOPHAGOGASTRODUODENOSCOPY N/A 01/15/2015   Procedure: ESOPHAGOGASTRODUODENOSCOPY (EGD);  Surgeon: [name], MD;  [facility];  Service: Endoscopy;  Laterality: N/A;   730 - moved to 9:45 - moved to 1250-Ann notified pt  . ESOPHAGOGASTRODUODENOSCOPY N/A 06/30/2015   Procedure: ESOPHAGOGASTRODUODENOSCOPY (EGD);  Surgeon: [name], MD;  [facility];  Service: Endoscopy;  Laterality: N/A;  1200  . ESOPHAGOGASTRODUODENOSCOPY N/A 01/05/2016   Procedure: ESOPHAGOGASTRODUODENOSCOPY (EGD);  Surgeon: [name], MD;  [facility];  Service: Endoscopy;  Laterality: N/A;  830  . ESOPHAGOGASTRODUODENOSCOPY N/A 08/03/2016   Procedure: ESOPHAGOGASTRODUODENOSCOPY (EGD);  Surgeon: [name], MD;  [facility];  Service: Endoscopy;  Laterality: N/A;  930  . ESOPHAGOGASTRODUODENOSCOPY (EGD) WITH PROPOFOL  09/24/2012   Procedure: ESOPHAGOGASTRODUODENOSCOPY (EGD) WITH PROPOFOL;  Surgeon: [name], MD;  [facility];  Service: Endoscopy;  Laterality: N/A;  GE junction at 36  . ESOPHAGOGASTRODUODENOSCOPY W/ BANDING  08/2010  . POLYPECTOMY  03/24/2016   Procedure: POLYPECTOMY;  Surgeon: [name], MD;  [facility];  Service: Endoscopy;;  sigmoid polyp  . RIGHT HEART CATH N/A 04/16/2017   Procedure: Right Heart Cath;  Surgeon: [name], MD;  Location: [facility];  Service: Cardiovascular;  Laterality: N/A;  . TONSILLECTOMY    . UPPER GASTROINTESTINAL ENDOSCOPY  03/15/2011   EGD ED BANDING/TCS  . UPPER GASTROINTESTINAL ENDOSCOPY  09/05/2010  . UPPER GASTROINTESTINAL ENDOSCOPY  08/11/2010  . VAGINAL HYSTERECTOMY      Family History  Problem Relation Age of Onset  . Lung cancer Mother   . Diabetes Father   . Diabetes Sister   . Hypertension Sister   . Hypothyroidism Sister   . Colon cancer Brother   . Diabetes Sister   . Hypothyroidism Brother   . Healthy Daughter   . Obesity Daughter   . Hypertension Daughter    Social History:  reports that she has never smoked. She has never used smokeless tobacco. She reports that she does not drink alcohol or use drugs.  Allergies: No Known  Allergies  Medications Prior to Admission  Medication Sig Dispense Refill  . cholecalciferol (VITAMIN D) 1000 UNITS tablet Take 1,000 Units by mouth every Monday, Wednesday, and Friday.     . ferrous sulfate 325 (65 FE) MG tablet Take 325 mg by mouth 2 (two) times daily with a meal.    . fluticasone furoate-vilanterol (BREO ELLIPTA) 200-25 MCG/INH AEPB Inhale 1 puff into the lungs daily. 1 each 3  . furosemide (LASIX) 40 MG tablet Take 1 tab in AM and 1/2 tab in PM 135 tablet 3  . Insulin Human (INSULIN PUMP) SOLN Inject into the skin daily.    . insulin lispro (HUMALOG) 100 UNIT/ML injection Inject into the skin 3 (three) times daily before meals. RATE AS FOLLOWS: 1200-0300 3.55 UNITS/HR, 0300-0830=3.70 UNITS/HR, 0830-1430=5.05 UNITS/HR, 1430-1900=5.10 UNITS/HR, & 1900-2359=4.65 UNITS/HR. Patient may bolus as needed with meals.    Marland Kitchen levothyroxine (SYNTHROID, LEVOTHROID) 25 MCG tablet Take 25 mcg by mouth daily before breakfast.     . OXYGEN Inhale 2 L into the lungs every evening. WITH SLEEP.    Marland Kitchen pantoprazole (PROTONIX) 40 MG tablet Take 1 tablet by mouth once daily 90  tablet 3  . PARoxetine (PAXIL) 40 MG tablet Take 40 mg by mouth every morning.    . Pediatric Multivitamins-Iron (FLINTSTONES PLUS IRON PO) Take 1 tablet by mouth 2 (two) times daily.     . potassium chloride SA (K-DUR,KLOR-CON) 20 MEQ tablet Take 20 mEq by mouth daily.    . propranolol (INDERAL) 20 MG tablet Take 20 mg by mouth See admin instructions. Take 1 tablet (20 mg) by mouth scheduled in the morning; may repeat dose in the evening if needed for elevated blood pressure.    . rosuvastatin (CRESTOR) 5 MG tablet Take 1 tablet (5 mg total) by mouth every other day. 15 tablet 3  . hydroxypropyl methylcellulose / hypromellose (ISOPTO TEARS / GONIOVISC) 2.5 % ophthalmic solution Place 1 drop into both eyes 3 (three) times daily as needed for dry eyes.      Results for orders placed or performed during the hospital encounter of  05/02/17 (from the past 48 hour(s))  Glucose, capillary     Status: Abnormal   Collection Time: 05/02/17 10:22 AM  Result Value Ref Range   Glucose-Capillary 138 (H) 65 - 99 mg/dL   No results found.  ROS  Blood pressure (!) 119/53, pulse 76, temperature 98.8 F (37.1 C), temperature source Oral, resp. rate 15, height 5' 4"  (1.626 m), weight 236 lb (107 kg), SpO2 97 %. Physical Exam  Constitutional: She appears well-developed and well-nourished.  HENT:  Mouth/Throat: Oropharynx is clear and moist.  Eyes: Conjunctivae are normal. No scleral icterus.  Neck: No thyromegaly present.  Cardiovascular: Normal rate and regular rhythm.   Murmur (grade 2/6 systolic ejection murmur best heard at left sternal border.) heard. Respiratory: Effort normal and breath sounds normal.  GI:  Abdomen Is full but soft and nontender without organomegaly or masses.  Musculoskeletal: She exhibits edema (1+ pitting eda involving both leg).  Lymphadenopathy:    She has no cervical adenopathy.  Neurological: She is alert.  Skin: Skin is warm and dry.     Assessment/Plan Esophageal varices. Iron deficiency anemia. EGD with variceal banding if varices are significant in size.  Hildred Laser, MD 05/02/2017, 10:43 AM

## 2017-05-02 NOTE — Op Note (Signed)
[facility] Patient Name: Maria [name] Procedure Date: 05/02/2017 10:18 AM MRN: [id number] Date of Birth: [birth year] Attending MD: Hildred Laser , MD CSN: [id number] Age: 61 Admit Type: Outpatient Procedure:                Upper GI endoscopy Indications:              Esophageal varices, Follow-up of esophageal                            varices, For therapy of esophageal varices Providers:                Hildred Laser, MD, Lurline Del, RN, Rosina Lowenstein, RN Referring MD:             Glenda Chroman, MD Medicines:                Lidocaine spray, Meperidine 50 mg IV, Midazolam 8                            mg IV Complications:            No immediate complications. Estimated Blood Loss:     Estimated blood loss: none. Procedure:                Pre-Anesthesia Assessment:                           - Prior to the procedure, a History and Physical                            was performed, and patient medications and                            allergies were reviewed. The patient's tolerance of                            previous anesthesia was also reviewed. The risks                            and benefits of the procedure and the sedation                            options and risks were discussed with the patient.                            All questions were answered, and informed consent                            was obtained. Prior Anticoagulants: The patient has                            taken no previous anticoagulant or antiplatelet                            agents. ASA Grade Assessment: III - A patient with  severe systemic disease. After reviewing the risks                            and benefits, the patient was deemed in                            satisfactory condition to undergo the procedure.                           After obtaining informed consent, the endoscope was                            passed under direct vision. Throughout the                         procedure, the patient's blood pressure, pulse, and                            oxygen saturations were monitored continuously. The                            [id number] ([id number]) scope was introduced through the                            mouth, and advanced to the second part of duodenum.                            The upper GI endoscopy was accomplished without                            difficulty. The patient tolerated the procedure                            well. The upper GI endoscopy was accomplished                            without difficulty. The patient tolerated the                            procedure well. Scope In: 10:58:11 AM Scope Out: 11:06:31 AM Total Procedure Duration: 0 hours 8 minutes 20 seconds  Findings:      A post variceal banding scar was found in the distal esophagus. The scar       tissue was healthy in appearance.      The exam of the esophagus was otherwise normal.      The Z-line was regular and was found 34 cm from the incisors.      Moderate portal hypertensive gastropathy was found in the gastric fundus.      Moderate gastric antral vascular ectasia without bleeding was present in       the gastric antrum and in the prepyloric region of the stomach.      The exam of the stomach was otherwise normal.      Patchy mild mucosal changes characterized by congestion were found in       the duodenal bulb and  in the second portion of the duodenum. Impression:               - Scar in the distal esophagus.                           - Z-line regular, 34 cm from the incisors.                           - Portal hypertensive gastropathy.                           - Gastric antral vascular ectasia without bleeding.                           - Mucosal changes in the duodenum.                           - No specimens collected. Moderate Sedation:      Moderate (conscious) sedation was administered by the endoscopy nurse       and supervised by  the endoscopist. The following parameters were       monitored: oxygen saturation, heart rate, blood pressure, CO2       capnography and response to care. Total physician intraservice time was       17 minutes. Recommendation:           - Patient has a contact number available for                            emergencies. The signs and symptoms of potential                            delayed complications were discussed with the                            patient. Return to normal activities tomorrow.                            Written discharge instructions were provided to the                            patient.                           - Resume previous diet today.                           - Continue present medications.                           - Repeat upper endoscopy in 1 year.                           - H/H in 2 weeks. Procedure Code(s):        --- Professional ---  93235, Esophagogastroduodenoscopy, flexible,                            transoral; diagnostic, including collection of                            specimen(s) by brushing or washing, when performed                            (separate procedure)                           99152, Moderate sedation services provided by the                            same physician or other qualified health care                            professional performing the diagnostic or                            therapeutic service that the sedation supports,                            requiring the presence of an independent trained                            observer to assist in the monitoring of the                            patient's level of consciousness and physiological                            status; initial 15 minutes of intraservice time,                            patient age 43 years or older Diagnosis Code(s):        --- Professional ---                           K22.8, Other specified diseases of  esophagus                           K76.6, Portal hypertension                           K31.89, Other diseases of stomach and duodenum                           K31.819, Angiodysplasia of stomach and duodenum                            without bleeding                           I85.00, Esophageal varices without bleeding CPT copyright  2016 American [employer]. All rights reserved. The codes documented in this report are preliminary and upon coder review may  be revised to meet current compliance requirements. Hildred Laser, MD Hildred Laser, MD 05/02/2017 11:28:46 AM This report has been signed electronically. Number of Addenda: 0

## 2017-05-03 ENCOUNTER — Encounter (INDEPENDENT_AMBULATORY_CARE_PROVIDER_SITE_OTHER): Payer: Self-pay | Admitting: *Deleted

## 2017-05-03 ENCOUNTER — Telehealth (INDEPENDENT_AMBULATORY_CARE_PROVIDER_SITE_OTHER): Payer: Self-pay | Admitting: *Deleted

## 2017-05-03 ENCOUNTER — Other Ambulatory Visit (INDEPENDENT_AMBULATORY_CARE_PROVIDER_SITE_OTHER): Payer: Self-pay | Admitting: *Deleted

## 2017-05-03 DIAGNOSIS — D509 Iron deficiency anemia, unspecified: Secondary | ICD-10-CM

## 2017-05-03 DIAGNOSIS — K7460 Unspecified cirrhosis of liver: Secondary | ICD-10-CM

## 2017-05-03 NOTE — Telephone Encounter (Signed)
This lab is noted,however the patient is to have a CBC on 05/14/2017. A letter has been sent.

## 2017-05-03 NOTE — Telephone Encounter (Signed)
Per op note, patient needs H&H in 2 weeks

## 2017-05-04 ENCOUNTER — Other Ambulatory Visit: Payer: Self-pay | Admitting: Cardiology

## 2017-05-04 ENCOUNTER — Encounter (HOSPITAL_COMMUNITY): Payer: Self-pay | Admitting: Internal Medicine

## 2017-05-04 DIAGNOSIS — I5032 Chronic diastolic (congestive) heart failure: Secondary | ICD-10-CM | POA: Diagnosis not present

## 2017-05-05 LAB — BASIC METABOLIC PANEL
BUN/Creatinine Ratio: 14 (ref 12–28)
BUN: 10 mg/dL (ref 8–27)
CO2: 23 mmol/L (ref 20–29)
Calcium: 8.5 mg/dL — ABNORMAL LOW (ref 8.7–10.3)
Chloride: 107 mmol/L — ABNORMAL HIGH (ref 96–106)
Creatinine, Ser: 0.7 mg/dL (ref 0.57–1.00)
GFR calc Af Amer: 108 mL/min/{1.73_m2} (ref >59)
GFR calc non Af Amer: 94 mL/min/{1.73_m2} (ref >59)
Glucose: 79 mg/dL (ref 65–99)
Potassium: 3.8 mmol/L (ref 3.5–5.2)
Sodium: 145 mmol/L — ABNORMAL HIGH (ref 134–144)

## 2017-05-15 DIAGNOSIS — E1165 Type 2 diabetes mellitus with hyperglycemia: Secondary | ICD-10-CM | POA: Diagnosis not present

## 2017-05-15 DIAGNOSIS — I8500 Esophageal varices without bleeding: Secondary | ICD-10-CM | POA: Diagnosis not present

## 2017-05-15 DIAGNOSIS — Z1211 Encounter for screening for malignant neoplasm of colon: Secondary | ICD-10-CM | POA: Diagnosis not present

## 2017-05-15 DIAGNOSIS — Z79899 Other long term (current) drug therapy: Secondary | ICD-10-CM | POA: Diagnosis not present

## 2017-05-15 DIAGNOSIS — Z0000 Encounter for general adult medical examination without abnormal findings: Secondary | ICD-10-CM | POA: Diagnosis not present

## 2017-05-15 DIAGNOSIS — Z7189 Other specified counseling: Secondary | ICD-10-CM | POA: Diagnosis not present

## 2017-05-15 DIAGNOSIS — I340 Nonrheumatic mitral (valve) insufficiency: Secondary | ICD-10-CM | POA: Diagnosis not present

## 2017-05-15 DIAGNOSIS — Z299 Encounter for prophylactic measures, unspecified: Secondary | ICD-10-CM | POA: Diagnosis not present

## 2017-05-15 DIAGNOSIS — Z1389 Encounter for screening for other disorder: Secondary | ICD-10-CM | POA: Diagnosis not present

## 2017-05-15 DIAGNOSIS — Z6841 Body Mass Index (BMI) 40.0 and over, adult: Secondary | ICD-10-CM | POA: Diagnosis not present

## 2017-05-15 DIAGNOSIS — D509 Iron deficiency anemia, unspecified: Secondary | ICD-10-CM | POA: Diagnosis not present

## 2017-05-15 DIAGNOSIS — K7460 Unspecified cirrhosis of liver: Secondary | ICD-10-CM | POA: Diagnosis not present

## 2017-05-15 DIAGNOSIS — R5383 Other fatigue: Secondary | ICD-10-CM | POA: Diagnosis not present

## 2017-05-15 DIAGNOSIS — E7800 Pure hypercholesterolemia, unspecified: Secondary | ICD-10-CM | POA: Diagnosis not present

## 2017-05-15 DIAGNOSIS — I10 Essential (primary) hypertension: Secondary | ICD-10-CM | POA: Diagnosis not present

## 2017-05-17 ENCOUNTER — Encounter (INDEPENDENT_AMBULATORY_CARE_PROVIDER_SITE_OTHER): Payer: Self-pay | Admitting: Internal Medicine

## 2017-05-17 ENCOUNTER — Encounter (INDEPENDENT_AMBULATORY_CARE_PROVIDER_SITE_OTHER): Payer: Self-pay | Admitting: *Deleted

## 2017-05-17 ENCOUNTER — Ambulatory Visit (INDEPENDENT_AMBULATORY_CARE_PROVIDER_SITE_OTHER): Payer: PPO | Admitting: Acute Care

## 2017-05-17 ENCOUNTER — Encounter: Payer: Self-pay | Admitting: Acute Care

## 2017-05-17 DIAGNOSIS — G4734 Idiopathic sleep related nonobstructive alveolar hypoventilation: Secondary | ICD-10-CM

## 2017-05-17 DIAGNOSIS — R0689 Other abnormalities of breathing: Secondary | ICD-10-CM

## 2017-05-17 DIAGNOSIS — R0600 Dyspnea, unspecified: Secondary | ICD-10-CM | POA: Diagnosis not present

## 2017-05-17 NOTE — Patient Instructions (Addendum)
It is nice to see you today. Continue wearing your night time oxygen each night at 2 L. Continue Breo 1 puff daily without fail. This your maintenance inhaler. Rinse mouth after use. Follow-up of diuresis and consideration of repeat Right heart cath per Dr. Aundra Dubin Follow up with Dr. Chase Caller in 2-3  months after follow up cardiology appointment/right heart cath. Please contact office for sooner follow up if symptoms do not improve or worsen or seek emergency care   If repeat RHC indicates resolution of PAH with diuresis,  we can repeat O&O to evaluate necessity of continued nocturnal oxygen. Consider pulmonary rehabilitation after repeat heart cath

## 2017-05-17 NOTE — Assessment & Plan Note (Addendum)
Pulmonary arterial hypertension per right heart cath Diuresis per cardiology to see if resolution Plan Wear nocturnal oxygen at 2 L nasal cannula every night Follow-up after diuresis and consideration of repeat Right heart cath per Dr. Aundra Dubin Follow up with Dr. Chase Caller in 2-3  months after follow up cardiology appointment/right heart cath. Please contact office for sooner follow up if symptoms do not improve or worsen or seek emergency care  If repeat RHC indicates resolution of PAH with diuresis,  we can repeat O&O to evaluate necessity of continued nocturnal oxygen. If repeat right heart cath does confirm  evidence of pulmonary arterial hypertension, consideration of treatment with pulmonary vasodilator Consider pulmonary rehabilitation after repeat heart cath       .

## 2017-05-17 NOTE — Progress Notes (Signed)
History of Present Illness Maria [name] is a 61 y.o. female  never smoker with , obesity, metabolic syndrome, and Maria [name] related cirrhosis with esophageal bleeding. She was seen for consultation by Dr. Chase Caller to evaluate shortness of breath and cough.    05/17/2017 Follow up Split night study: Pt presents for follow up of Split night study.Study indicated mild nocturnal desaturations. Oxygen was ordered to be worn at 2 L nasal cannula at bedtime. She states she has the oxygen , but she is not wearing her oxygen. After her right-sided cath, and subsequent diuresis per Dr. Kirk Ruths , she states she feels  she does not need it. She feels after diuresis her breathing is better. We had a long discussion about the risks of untreated nocturnal hypoxemia in relation to her suspected pulmonary artery hypertension, and that she needs to wear her nocturnal oxygen until we have the repeat RHC. This is will be scheduled soon. She is in contact with Dr. Elby Showers  office currently getting it scheduled. Of note her edema is much improved. She is using the [facility] as we prescribed. She states she has been compliant. She states it does help her wheezing. Patient denies fever, chest pain, orthopnea, or hemoptysis. .  Test Results:  HRCT 02/08/2017 No evidence of interstitial lung disease. 2. Moderate air trapping indicative of small airways disease. 3. Mild dilatation of the pulmonic trunk (3.5 cm in diameter), concerning for pulmonary arterial hypertension. 4. Stigmata of cirrhosis with portal hypertension including splenomegaly and small volume of ascites redemonstrated, as above. 5. Aortic atherosclerosis, in addition to left main and left anterior descending coronary artery disease   O&O 01/31/2017 pulse ox less than 88% for 2 hours 27 minutes  01/25/2017>>Walking desaturation test 185 feet 3 laps on room air: Resting pulse ox was 96% final pulse ox was 92%. Resting heart rate was 86/m and final heart  rate was 102/m.  feNO 01/25/2017-> 27ppb and cusp of normal  RHC 04/16/2017>> 1. Elevated right and left heart filling pressures but suggestive of RV >LV failure.  2. PVR 2.6 WU is suggestive primarily of pulmonary venous hypertension (group 2) though she is at risk for porto-pulmonary hypertension.  Recommendations per Dr. Loralie Champagne:  I will have her increase Lasix to 40 mg daily with KCl 20 daily. Once she is well-diuresed, would consider repeating RHC to make sure pulmonary hypertension improves with diuresis or if there is actually evidence for pulmonary arterial hypertension, for which I would consider treatment with pulmonary vasodilators  03/12/2017>> Split Night Study No significant obstructive sleep apnea occurred during this  study (AHI = 0.0/h). - No significant central sleep apnea occurred during this study  (CAI = 0.0/h). - Mild oxygen desaturation was noted during this study (Min O2 =  88.00%, Mean 91.2%). - The patient snored with Loud snoring volume. - EKG findings include PVCs. - Mild periodic limb movements of sleep occurred during the  study. No significant associated arousals.  CBC Latest Ref Rng & Units 04/26/2017 04/24/2017 04/16/2017  WBC 4.0 - 10.5 K/uL - 4.7 3.6(L)  Hemoglobin 11.7 - 15.5 g/dL 8.6(L) 8.3(L) 8.1(L)  Hematocrit 35.0 - 45.0 % 28.8(L) 28.3(L) 27.7(L)  Platelets 150 - 400 K/uL - PLATELET CLUMPS NOTED ON SMEAR, COUNT APPEARS ADEQUATE PLATELET CLUMPS NOTED ON SMEAR, COUNT APPEARS DECREASED    BMP Latest Ref Rng & Units 05/04/2017 04/24/2017 04/16/2017  Glucose 65 - 99 mg/dL 79 225(H) 169(H)  BUN 8 - 27 mg/dL 10 8 10   Creatinine  0.57 - 1.00 mg/dL 0.70 0.82 0.80  BUN/Creat Ratio 12 - 28 14 - -  Sodium 134 - 144 mmol/L 145(H) 140 139  Potassium 3.5 - 5.2 mmol/L 3.8 3.6 3.9  Chloride 96 - 106 mmol/L 107(H) 108 109  CO2 20 - 29 mmol/L 23 24 23   Calcium 8.7 - 10.3 mg/dL 8.5(L) 8.7(L) 8.6(L)    BNP    Component Value Date/Time   BNP 59.1  04/24/2017 1450     Past medical hx Past Medical History:  Diagnosis Date  . Anemia   . CHF (congestive heart failure) ([place])   . Cirrhosis ([place])   . Depression   . Diabetes mellitus   . Esophageal bleed, non-variceal   . Eye hemorrhage    Behind left eye  . Fibromyalgia   . Hypercholesteremia   . Hypertension   . Hypothyroidism   . NAFLD (nonalcoholic fatty liver disease)   . Osteoarthrosis   . Osteoporosis   . PONV (postoperative nausea and vomiting)   . UTI (urinary tract infection) 11/12 end of the month   Patient feels that she passed a kidney stone at that time     Social History  Substance Use Topics  . Smoking status: Never Smoker  . Smokeless tobacco: Never Used  . Alcohol use No    Maria [name] reports that she has never smoked. She has never used smokeless tobacco. She reports that she does not drink alcohol or use drugs.  Tobacco Cessation: Never smoker  Past surgical hx, Family hx, Social hx all reviewed.  Current Outpatient Prescriptions on File Prior to Visit  Medication Sig  . cholecalciferol (VITAMIN D) 1000 UNITS tablet Take 1,000 Units by mouth every Monday, Wednesday, and Friday.   . ferrous sulfate 325 (65 FE) MG tablet Take 325 mg by mouth 2 (two) times daily with a meal.  . fluticasone furoate-vilanterol (BREO ELLIPTA) 200-25 MCG/INH AEPB Inhale 1 puff into the lungs daily.  . furosemide (LASIX) 40 MG tablet Take 1 tab in AM and 1/2 tab in PM  . hydroxypropyl methylcellulose / hypromellose (ISOPTO TEARS / GONIOVISC) 2.5 % ophthalmic solution Place 1 drop into both eyes 3 (three) times daily as needed for dry eyes.  . Insulin Human (INSULIN PUMP) SOLN Inject into the skin daily.  . insulin lispro (HUMALOG) 100 UNIT/ML injection Inject into the skin 3 (three) times daily before meals. RATE AS FOLLOWS: 1200-0300 3.55 UNITS/HR, 0300-0830=3.70 UNITS/HR, 0830-1430=5.05 UNITS/HR, 1430-1900=5.10 UNITS/HR, & 1900-2359=4.65 UNITS/HR. Patient may bolus as  needed with meals.  Marland Kitchen levothyroxine (SYNTHROID, LEVOTHROID) 25 MCG tablet Take 25 mcg by mouth daily before breakfast.   . OXYGEN Inhale 2 L into the lungs every evening. WITH SLEEP.  Marland Kitchen pantoprazole (PROTONIX) 40 MG tablet Take 1 tablet by mouth once daily  . PARoxetine (PAXIL) 40 MG tablet Take 40 mg by mouth every morning.  . potassium chloride SA (K-DUR,KLOR-CON) 20 MEQ tablet Take 20 mEq by mouth daily.  . propranolol (INDERAL) 20 MG tablet Take 20 mg by mouth See admin instructions. Take 1 tablet (20 mg) by mouth scheduled in the morning; may repeat dose in the evening if needed for elevated blood pressure.  . rosuvastatin (CRESTOR) 5 MG tablet Take 1 tablet (5 mg total) by mouth every other day.   No current facility-administered medications on file prior to visit.      No Known Allergies  Review Of Systems:  Constitutional:   No  weight loss, night sweats,  Fevers, chills, fatigue, or  lassitude.  HEENT:   No headaches,  Difficulty swallowing,  Tooth/dental problems, or  Sore throat,                No sneezing, itching, ear ache, nasal congestion, post nasal drip,   CV:  No chest pain,  Orthopnea, PND, + but improved swelling in lower extremities, anasarca, dizziness, palpitations, syncope.   GI  No heartburn, indigestion, abdominal pain, nausea, vomiting, diarrhea, change in bowel habits, loss of appetite, bloody stools.   Resp: No shortness of breath with exertion or at rest.  No excess mucus, no productive cough,  Occasional non-productive cough, No coughing up of blood.  No change in color of mucus.  No wheezing.  No chest wall deformity  Skin: no rash or lesions.  GU: no dysuria, change in color of urine, no urgency or frequency.  No flank pain, no hematuria   MS:  No joint pain or swelling.  No decreased range of motion.  No back pain.  Psych:  No change in mood or affect. No depression or anxiety.  No memory loss.   Vital Signs BP 138/80 (BP Location: Left Arm, Cuff  Size: Normal)   Pulse 81   Wt 228 lb (103.4 kg)   SpO2 94%   BMI 39.14 kg/m   Body mass index is 39.14 kg/m.   Physical Exam:  General- No distress,  A&Ox3, pleasant ENT: No sinus tenderness, TM clear, pale nasal mucosa, no oral exudate,no post nasal drip, no LAN Cardiac: S1, S2, regular rate and rhythm, no murmur Chest: No wheeze/ rales/ dullness; no accessory muscle use, no nasal flaring, no sternal retractions, diminished per bases bilaterally Abd.: Soft Non-tender, obese Ext: No clubbing cyanosis, 1+ bilateral lower extremity edema Neuro: Deconditioned at baseline Skin: No rashes, warm and dry Psych: normal mood and behavior, flat affect   Assessment/Plan  Nocturnal hypoxemia Please wear oxygen at 2 L nasal cannula at bedtime and with naps during the day  Dyspnea and respiratory abnormalities Pulmonary arterial hypertension per right heart cath Diuresis per cardiology to see if resolution Plan Wear nocturnal oxygen at 2 L nasal cannula every night Follow-up after diuresis and consideration of repeat Right heart cath per Dr. Aundra Dubin Follow up with Dr. Chase Caller in 2-3  months after follow up cardiology appointment/right heart cath. Please contact office for sooner follow up if symptoms do not improve or worsen or seek emergency care  If repeat RHC indicates resolution of PAH with diuresis,  we can repeat O&O to evaluate necessity of continued nocturnal oxygen. If repeat right heart cath does confirm  evidence of pulmonary arterial hypertension, consideration of treatment with pulmonary vasodilator Consider pulmonary rehabilitation after repeat heart cath       .    Magdalen Spatz, NP 05/17/2017  4:29 PM

## 2017-05-17 NOTE — Assessment & Plan Note (Signed)
Please wear oxygen at 2 L nasal cannula at bedtime and with naps during the day

## 2017-05-25 ENCOUNTER — Encounter (HOSPITAL_COMMUNITY): Payer: PPO | Admitting: Cardiology

## 2017-05-25 DIAGNOSIS — R0609 Other forms of dyspnea: Secondary | ICD-10-CM | POA: Diagnosis not present

## 2017-05-29 DIAGNOSIS — R531 Weakness: Secondary | ICD-10-CM | POA: Diagnosis not present

## 2017-05-29 DIAGNOSIS — Z6841 Body Mass Index (BMI) 40.0 and over, adult: Secondary | ICD-10-CM | POA: Diagnosis not present

## 2017-05-29 DIAGNOSIS — Z299 Encounter for prophylactic measures, unspecified: Secondary | ICD-10-CM | POA: Diagnosis not present

## 2017-05-29 DIAGNOSIS — E1165 Type 2 diabetes mellitus with hyperglycemia: Secondary | ICD-10-CM | POA: Diagnosis not present

## 2017-05-29 DIAGNOSIS — D509 Iron deficiency anemia, unspecified: Secondary | ICD-10-CM | POA: Diagnosis not present

## 2017-05-29 DIAGNOSIS — K7460 Unspecified cirrhosis of liver: Secondary | ICD-10-CM | POA: Diagnosis not present

## 2017-06-11 ENCOUNTER — Ambulatory Visit (HOSPITAL_COMMUNITY)
Admission: RE | Admit: 2017-06-11 | Discharge: 2017-06-11 | Disposition: A | Discharge status: Home / Self Care | Payer: PPO | Source: Ambulatory Visit | Attending: Cardiology | Admitting: Cardiology

## 2017-06-11 ENCOUNTER — Encounter (HOSPITAL_COMMUNITY): Payer: Self-pay | Admitting: Cardiology

## 2017-06-11 VITALS — BP 140/86 | HR 71 | Wt 252.8 lb

## 2017-06-11 DIAGNOSIS — Z800 Family history of malignant neoplasm of digestive organs: Secondary | ICD-10-CM | POA: Insufficient documentation

## 2017-06-11 DIAGNOSIS — Z9641 Presence of insulin pump (external) (internal): Secondary | ICD-10-CM | POA: Insufficient documentation

## 2017-06-11 DIAGNOSIS — I2510 Atherosclerotic heart disease of native coronary artery without angina pectoris: Secondary | ICD-10-CM | POA: Diagnosis not present

## 2017-06-11 DIAGNOSIS — Z794 Long term (current) use of insulin: Secondary | ICD-10-CM | POA: Insufficient documentation

## 2017-06-11 DIAGNOSIS — D649 Anemia, unspecified: Secondary | ICD-10-CM | POA: Insufficient documentation

## 2017-06-11 DIAGNOSIS — I110 Hypertensive heart disease with heart failure: Secondary | ICD-10-CM | POA: Insufficient documentation

## 2017-06-11 DIAGNOSIS — Z8349 Family history of other endocrine, nutritional and metabolic diseases: Secondary | ICD-10-CM | POA: Diagnosis not present

## 2017-06-11 DIAGNOSIS — I5032 Chronic diastolic (congestive) heart failure: Secondary | ICD-10-CM | POA: Insufficient documentation

## 2017-06-11 DIAGNOSIS — D696 Thrombocytopenia, unspecified: Secondary | ICD-10-CM | POA: Diagnosis not present

## 2017-06-11 DIAGNOSIS — I272 Pulmonary hypertension, unspecified: Secondary | ICD-10-CM | POA: Diagnosis not present

## 2017-06-11 DIAGNOSIS — Z79899 Other long term (current) drug therapy: Secondary | ICD-10-CM | POA: Insufficient documentation

## 2017-06-11 DIAGNOSIS — Z7951 Long term (current) use of inhaled steroids: Secondary | ICD-10-CM | POA: Diagnosis not present

## 2017-06-11 DIAGNOSIS — K7460 Unspecified cirrhosis of liver: Secondary | ICD-10-CM | POA: Insufficient documentation

## 2017-06-11 DIAGNOSIS — Z8249 Family history of ischemic heart disease and other diseases of the circulatory system: Secondary | ICD-10-CM | POA: Insufficient documentation

## 2017-06-11 DIAGNOSIS — R161 Splenomegaly, not elsewhere classified: Secondary | ICD-10-CM | POA: Insufficient documentation

## 2017-06-11 DIAGNOSIS — D509 Iron deficiency anemia, unspecified: Secondary | ICD-10-CM

## 2017-06-11 DIAGNOSIS — K7581 Nonalcoholic steatohepatitis (NASH): Secondary | ICD-10-CM | POA: Insufficient documentation

## 2017-06-11 DIAGNOSIS — Z833 Family history of diabetes mellitus: Secondary | ICD-10-CM | POA: Insufficient documentation

## 2017-06-11 DIAGNOSIS — E1165 Type 2 diabetes mellitus with hyperglycemia: Secondary | ICD-10-CM | POA: Diagnosis not present

## 2017-06-11 LAB — BASIC METABOLIC PANEL
Anion gap: 5 (ref 5–15)
BUN: 10 mg/dL (ref 6–20)
CO2: 26 mmol/L (ref 22–32)
Calcium: 8.2 mg/dL — ABNORMAL LOW (ref 8.9–10.3)
Chloride: 108 mmol/L (ref 101–111)
Creatinine, Ser: 0.84 mg/dL (ref 0.44–1.00)
GFR calc Af Amer: >60 mL/min (ref >60)
GFR calc non Af Amer: >60 mL/min (ref >60)
Glucose, Bld: 121 mg/dL — ABNORMAL HIGH (ref 65–99)
Potassium: 3.8 mmol/L (ref 3.5–5.1)
Sodium: 139 mmol/L (ref 135–145)

## 2017-06-11 MED ORDER — FUROSEMIDE 40 MG PO TABS: 40.0000 mg | ORAL_TABLET | Freq: Two times a day (BID) | ORAL | 3 refills | Status: DC

## 2017-06-11 NOTE — Patient Instructions (Signed)
Take Furosemide 60 mg (1.5 tab), twice a day for 3 days  Decrease Furosamide 40 mg (1 tab), twice a day  Your physician recommends that you schedule a follow-up appointment in: 2 weeks

## 2017-06-11 NOTE — Progress Notes (Signed)
PCP: Dr. Woody Seller HF Cardiology: Dr. Aundra Dubin  61 yo sent for CHF clinic evaluation due to progressive dyspnea.  Patient has been having exertional dypsnea for a year or so.  She had been short of breath walking about 100 yards.  Echo in 3/18 showed mild LVH and mild MR with normal EF.  High resolution CT lungs in 5/18 did not have evidence for interstitial lung disease.   She additionally has history of NASH with cirrhosis and variceal bleeding as well as splenomegaly, followed by Dr. Laural Golden.   She was sent for right heart cath as there was worry for possible portopulmonary hypertension.  RHC showed elevated right and left heart filling pressures with pulmonary venous hypertension.  She had been taking Lasix prn.  After the cath, Lasix increased to 40 mg daily.   She presents today for 1 month follow up. At last visit lasix increased. She is up 6 lbs from that visit. Weight at home up to 252 at home this am. (typically runs closer to mid 240s. She states she initially felt better after last visit, but has gradually felt worse again. She had mild SOB with housework. Now mildly SOB taking trash out.  She is SOB showering. Does have occasional orthopnea, but sleeps on 2 pillows chronically. Mild CP when SOB. Denies BRBPR/melena. Stool chronically black from Iron. Does have mild streaking blood on toilet paper from external hemorrhoids.   ECG (personally reviewed, 7/18): NSR, LAFB, poor RWP  Labs (7/18): K 3.9, creatinine 0.8, hgb 8.1                                                                                                         PMH:  1. NASH with cirrhosis: H/o variceal bleeding.  2. Splenomegaly related to cirrhosis.  3. Sleep study 6/8 showed no significant OSA.  4. High resolution CT chest (5/18): No interstitial lung disease, +calcium in coronaries.  5. Chronic diastolic CHF:  - Echo (9/47): EF 60-65%, mild LVH, mild MR.  Negative bubble study.  - RHC (7/18): mean RA 16, PA 64/25, mean PCWP  21, CI 4.16, PVR 2.6 WU.  6. Anemia 7. Thrombocytopenia (likely related to splenomegaly).   Social History   Social History  . Marital status: Married    Spouse name: N/A  . Number of children: N/A  . Years of education: N/A   Occupational History  . retired     Social History Main Topics  . Smoking status: Never Smoker  . Smokeless tobacco: Never Used  . Alcohol use No  . Drug use: No  . Sexual activity: Not on file   Other Topics Concern  . Not on file   Social History Narrative   Lives with husband    Family History  Problem Relation Age of Onset  . Lung cancer Mother   . Diabetes Father   . Diabetes Sister   . Hypertension Sister   . Hypothyroidism Sister   . Colon cancer Brother   . Diabetes Sister   . Hypothyroidism Brother   . Healthy  Daughter   . Obesity Daughter   . Hypertension Daughter    Review of systems complete and found to be negative unless listed in HPI.    Current Outpatient Prescriptions  Medication Sig Dispense Refill  . cholecalciferol (VITAMIN D) 1000 UNITS tablet Take 1,000 Units by mouth every Monday, Wednesday, and Friday.     . ferrous sulfate 325 (65 FE) MG tablet Take 325 mg by mouth 2 (two) times daily with a meal.    . fluticasone furoate-vilanterol (BREO ELLIPTA) 200-25 MCG/INH AEPB Inhale 1 puff into the lungs daily. 1 each 3  . furosemide (LASIX) 40 MG tablet Take 1 tab in AM and 1/2 tab in PM 135 tablet 3  . hydroxypropyl methylcellulose / hypromellose (ISOPTO TEARS / GONIOVISC) 2.5 % ophthalmic solution Place 1 drop into both eyes 3 (three) times daily as needed for dry eyes.    . Insulin Human (INSULIN PUMP) SOLN Inject into the skin daily.    . insulin lispro (HUMALOG) 100 UNIT/ML injection Inject into the skin 3 (three) times daily before meals. RATE AS FOLLOWS: 1200-0300 3.55 UNITS/HR, 0300-0830=3.70 UNITS/HR, 0830-1430=5.05 UNITS/HR, 1430-1900=5.10 UNITS/HR, & 1900-2359=4.65 UNITS/HR. Patient may bolus as needed with meals.     Marland Kitchen levothyroxine (SYNTHROID, LEVOTHROID) 25 MCG tablet Take 25 mcg by mouth daily before breakfast.     . OXYGEN Inhale 2 L into the lungs every evening. WITH SLEEP.    Marland Kitchen pantoprazole (PROTONIX) 40 MG tablet Take 1 tablet by mouth once daily 90 tablet 3  . PARoxetine (PAXIL) 40 MG tablet Take 40 mg by mouth every morning.    . potassium chloride SA (K-DUR,KLOR-CON) 20 MEQ tablet Take 20 mEq by mouth daily.    . propranolol (INDERAL) 20 MG tablet Take 20 mg by mouth See admin instructions. Take 1 tablet (20 mg) by mouth scheduled in the morning; may repeat dose in the evening if needed for elevated blood pressure.    . rosuvastatin (CRESTOR) 5 MG tablet Take 1 tablet (5 mg total) by mouth every other day. 15 tablet 3   No current facility-administered medications for this encounter.    Vitals:   06/11/17 1139  BP: 140/86  Pulse: 71  SpO2: 93%  Weight: 252 lb 12.8 oz (114.7 kg)   Wt Readings from Last 3 Encounters:  06/11/17 252 lb 12.8 oz (114.7 kg)  05/17/17 228 lb (103.4 kg)  05/02/17 236 lb (107 kg)    General: NAD.  HEENT: Normal Neck: Supple. JVP 9-10 with mild HJR. Carotids 2+ bilat; no bruits. No thyromegaly or nodule noted. Cor: PMI nondisplaced. RRR, 2/6 SEM RUSB. Trace to 1+ ankle edema.  Lungs: CTAB, normal effort. Abdomen: Soft, non-tender, non-distended, no HSM. No bruits or masses. +BS  Extremities: No cyanosis, clubbing, rash, R and LLE no edema.  Neuro: Alert & orientedx3, cranial nerves grossly intact. moves all 4 extremities w/o difficulty. Affect pleasant   Assessment/Plan: 1. Chronic diastolic CHF: RHC in 0/27 is suggestive of significant diastolic CHF.  EF was normal by echo. - She remains volume overloaded today.  - NYHA class II-III symptoms.   - Increase lasix to 60 mg BID x 3 days, and then 40 mg BID chronically. With cirrhosis, may need to consider switch to torsemide.  - Reinforced fluid restriction to < 2 L daily, sodium restriction to less than 2000  mg daily, and the importance of daily weights.   2. Pulmonary hypertension: Patient is at risk for portopulmonary hypertension.   However, RHC in  7/18 showed pulmonary venous hypertension (group 2).   - After full diuresis, Will need repeat RHC to reassess for pulmonary hypertension. Discussed with Dr. Aundra Dubin  3. Cirrhosis: NASH with splenomegaly, thrombocytopenia.  4. CAD: Coronary calcium on CT chest in 5/18.  No chest pain.  - In absence of chest pain, would not do stress test or cath => poor candidate for invasive cardiac evaluation and stent placement with very low platelets.  - No s/s of ischemia.    - Lipids OK last visit. Continue crestorlipids today.  She should be on a statin, will start based on LDL level.  5. Anemia:  - Per her GI MD.  6. Thrombocytopenia: Likely related to cirrhosis and splenomegaly. No change  Followup in 2 weeks for volume assessment. BMET today. Plan RHC per Dr. Aundra Dubin once optimized.   Shirley Friar, PA-C  06/11/2017   Greater than 50% of the 25 minute visit was spent in counseling/coordination of care regarding disease state education, sliding scale diuretics, salt/fluid restriction, and medication reconciliation.

## 2017-06-13 ENCOUNTER — Other Ambulatory Visit (HOSPITAL_COMMUNITY): Payer: Self-pay | Admitting: *Deleted

## 2017-06-24 DIAGNOSIS — R0609 Other forms of dyspnea: Secondary | ICD-10-CM | POA: Diagnosis not present

## 2017-06-27 ENCOUNTER — Other Ambulatory Visit (HOSPITAL_COMMUNITY): Payer: Self-pay | Admitting: Cardiology

## 2017-06-27 MED ORDER — ROSUVASTATIN CALCIUM 5 MG PO TABS: 5.0000 mg | ORAL_TABLET | ORAL | 3 refills | Status: DC

## 2017-07-02 ENCOUNTER — Encounter (HOSPITAL_COMMUNITY)
Admission: RE | Admit: 2017-07-02 | Discharge: 2017-07-02 | Disposition: A | Discharge status: Home / Self Care | Payer: PPO | Source: Ambulatory Visit | Attending: Internal Medicine | Admitting: Internal Medicine

## 2017-07-02 DIAGNOSIS — D509 Iron deficiency anemia, unspecified: Secondary | ICD-10-CM | POA: Diagnosis not present

## 2017-07-02 MED ORDER — SODIUM CHLORIDE 0.9 % IV SOLN
INTRAVENOUS | Status: DC
  Administered 2017-07-02: 09:00:00 via INTRAVENOUS

## 2017-07-02 MED ORDER — SODIUM CHLORIDE 0.9 % IV SOLN
510.0000 mg | INTRAVENOUS | Status: DC
  Administered 2017-07-02: 510 mg via INTRAVENOUS
  Filled 2017-07-02: qty 17

## 2017-07-04 ENCOUNTER — Ambulatory Visit (HOSPITAL_BASED_OUTPATIENT_CLINIC_OR_DEPARTMENT_OTHER)
Admission: RE | Admit: 2017-07-04 | Discharge: 2017-07-04 | Disposition: A | Discharge status: Home / Self Care | Payer: PPO | Source: Ambulatory Visit | Attending: Cardiology | Admitting: Cardiology

## 2017-07-04 ENCOUNTER — Inpatient Hospital Stay (HOSPITAL_COMMUNITY)
Admission: AD | Admit: 2017-07-04 | Discharge: 2017-07-10 | DRG: 377 | Disposition: A | Discharge status: Home / Self Care | Payer: PPO | Source: Ambulatory Visit | Attending: Internal Medicine | Admitting: Internal Medicine

## 2017-07-04 ENCOUNTER — Encounter (HOSPITAL_COMMUNITY): Payer: Self-pay

## 2017-07-04 ENCOUNTER — Telehealth (HOSPITAL_COMMUNITY): Payer: Self-pay | Admitting: *Deleted

## 2017-07-04 ENCOUNTER — Telehealth: Payer: Self-pay | Admitting: Internal Medicine

## 2017-07-04 ENCOUNTER — Other Ambulatory Visit (HOSPITAL_COMMUNITY): Payer: Self-pay

## 2017-07-04 ENCOUNTER — Telehealth (INDEPENDENT_AMBULATORY_CARE_PROVIDER_SITE_OTHER): Payer: Self-pay | Admitting: Internal Medicine

## 2017-07-04 VITALS — BP 134/68 | HR 77 | Wt 257.4 lb

## 2017-07-04 DIAGNOSIS — D500 Iron deficiency anemia secondary to blood loss (chronic): Secondary | ICD-10-CM

## 2017-07-04 DIAGNOSIS — Z794 Long term (current) use of insulin: Secondary | ICD-10-CM

## 2017-07-04 DIAGNOSIS — I2510 Atherosclerotic heart disease of native coronary artery without angina pectoris: Secondary | ICD-10-CM

## 2017-07-04 DIAGNOSIS — K7460 Unspecified cirrhosis of liver: Secondary | ICD-10-CM | POA: Diagnosis not present

## 2017-07-04 DIAGNOSIS — R0600 Dyspnea, unspecified: Secondary | ICD-10-CM

## 2017-07-04 DIAGNOSIS — E7800 Pure hypercholesterolemia, unspecified: Secondary | ICD-10-CM | POA: Diagnosis not present

## 2017-07-04 DIAGNOSIS — E119 Type 2 diabetes mellitus without complications: Secondary | ICD-10-CM | POA: Diagnosis not present

## 2017-07-04 DIAGNOSIS — K228 Other specified diseases of esophagus: Secondary | ICD-10-CM | POA: Diagnosis not present

## 2017-07-04 DIAGNOSIS — K449 Diaphragmatic hernia without obstruction or gangrene: Secondary | ICD-10-CM | POA: Diagnosis present

## 2017-07-04 DIAGNOSIS — I8500 Esophageal varices without bleeding: Secondary | ICD-10-CM | POA: Diagnosis present

## 2017-07-04 DIAGNOSIS — R0789 Other chest pain: Secondary | ICD-10-CM | POA: Diagnosis not present

## 2017-07-04 DIAGNOSIS — R161 Splenomegaly, not elsewhere classified: Secondary | ICD-10-CM | POA: Diagnosis present

## 2017-07-04 DIAGNOSIS — D6959 Other secondary thrombocytopenia: Secondary | ICD-10-CM | POA: Diagnosis present

## 2017-07-04 DIAGNOSIS — T501X5A Adverse effect of loop [high-ceiling] diuretics, initial encounter: Secondary | ICD-10-CM | POA: Diagnosis present

## 2017-07-04 DIAGNOSIS — I5033 Acute on chronic diastolic (congestive) heart failure: Secondary | ICD-10-CM | POA: Diagnosis not present

## 2017-07-04 DIAGNOSIS — D509 Iron deficiency anemia, unspecified: Secondary | ICD-10-CM

## 2017-07-04 DIAGNOSIS — T454X5A Adverse effect of iron and its compounds, initial encounter: Secondary | ICD-10-CM | POA: Diagnosis not present

## 2017-07-04 DIAGNOSIS — K766 Portal hypertension: Secondary | ICD-10-CM | POA: Diagnosis not present

## 2017-07-04 DIAGNOSIS — K31819 Angiodysplasia of stomach and duodenum without bleeding: Secondary | ICD-10-CM | POA: Diagnosis not present

## 2017-07-04 DIAGNOSIS — F419 Anxiety disorder, unspecified: Secondary | ICD-10-CM | POA: Diagnosis not present

## 2017-07-04 DIAGNOSIS — K760 Fatty (change of) liver, not elsewhere classified: Secondary | ICD-10-CM | POA: Diagnosis not present

## 2017-07-04 DIAGNOSIS — I50813 Acute on chronic right heart failure: Secondary | ICD-10-CM | POA: Diagnosis present

## 2017-07-04 DIAGNOSIS — E876 Hypokalemia: Secondary | ICD-10-CM

## 2017-07-04 DIAGNOSIS — K31811 Angiodysplasia of stomach and duodenum with bleeding: Secondary | ICD-10-CM | POA: Diagnosis not present

## 2017-07-04 DIAGNOSIS — I272 Pulmonary hypertension, unspecified: Secondary | ICD-10-CM | POA: Diagnosis not present

## 2017-07-04 DIAGNOSIS — E039 Hypothyroidism, unspecified: Secondary | ICD-10-CM | POA: Diagnosis not present

## 2017-07-04 DIAGNOSIS — K7581 Nonalcoholic steatohepatitis (NASH): Secondary | ICD-10-CM | POA: Diagnosis not present

## 2017-07-04 DIAGNOSIS — I440 Atrioventricular block, first degree: Secondary | ICD-10-CM | POA: Diagnosis present

## 2017-07-04 DIAGNOSIS — M797 Fibromyalgia: Secondary | ICD-10-CM | POA: Diagnosis not present

## 2017-07-04 DIAGNOSIS — I5032 Chronic diastolic (congestive) heart failure: Secondary | ICD-10-CM

## 2017-07-04 DIAGNOSIS — K3189 Other diseases of stomach and duodenum: Secondary | ICD-10-CM | POA: Diagnosis not present

## 2017-07-04 DIAGNOSIS — K922 Gastrointestinal hemorrhage, unspecified: Secondary | ICD-10-CM

## 2017-07-04 DIAGNOSIS — M810 Age-related osteoporosis without current pathological fracture: Secondary | ICD-10-CM | POA: Diagnosis not present

## 2017-07-04 DIAGNOSIS — I110 Hypertensive heart disease with heart failure: Secondary | ICD-10-CM | POA: Diagnosis present

## 2017-07-04 DIAGNOSIS — D649 Anemia, unspecified: Secondary | ICD-10-CM

## 2017-07-04 DIAGNOSIS — I8510 Secondary esophageal varices without bleeding: Secondary | ICD-10-CM | POA: Diagnosis not present

## 2017-07-04 DIAGNOSIS — D696 Thrombocytopenia, unspecified: Secondary | ICD-10-CM

## 2017-07-04 DIAGNOSIS — Z9641 Presence of insulin pump (external) (internal): Secondary | ICD-10-CM | POA: Diagnosis not present

## 2017-07-04 DIAGNOSIS — Z79899 Other long term (current) drug therapy: Secondary | ICD-10-CM | POA: Diagnosis not present

## 2017-07-04 DIAGNOSIS — Z23 Encounter for immunization: Secondary | ICD-10-CM

## 2017-07-04 DIAGNOSIS — R188 Other ascites: Secondary | ICD-10-CM | POA: Diagnosis not present

## 2017-07-04 DIAGNOSIS — F329 Major depressive disorder, single episode, unspecified: Secondary | ICD-10-CM | POA: Diagnosis not present

## 2017-07-04 DIAGNOSIS — E118 Type 2 diabetes mellitus with unspecified complications: Secondary | ICD-10-CM | POA: Diagnosis not present

## 2017-07-04 LAB — BASIC METABOLIC PANEL
Anion gap: 7 (ref 5–15)
Anion gap: 9 (ref 5–15)
BUN: 10 mg/dL (ref 6–20)
BUN: 12 mg/dL (ref 6–20)
CO2: 23 mmol/L (ref 22–32)
CO2: 23 mmol/L (ref 22–32)
Calcium: 8.1 mg/dL — ABNORMAL LOW (ref 8.9–10.3)
Calcium: 8.2 mg/dL — ABNORMAL LOW (ref 8.9–10.3)
Chloride: 108 mmol/L (ref 101–111)
Chloride: 108 mmol/L (ref 101–111)
Creatinine, Ser: 0.95 mg/dL (ref 0.44–1.00)
Creatinine, Ser: 0.89 mg/dL (ref 0.44–1.00)
GFR calc Af Amer: >60 mL/min (ref >60)
GFR calc Af Amer: >60 mL/min (ref >60)
GFR calc non Af Amer: >60 mL/min (ref >60)
GFR calc non Af Amer: >60 mL/min (ref >60)
Glucose, Bld: 87 mg/dL (ref 65–99)
Glucose, Bld: 61 mg/dL — ABNORMAL LOW (ref 65–99)
Potassium: 3.2 mmol/L — ABNORMAL LOW (ref 3.5–5.1)
Potassium: 2.7 mmol/L — CL (ref 3.5–5.1)
Sodium: 138 mmol/L (ref 135–145)
Sodium: 140 mmol/L (ref 135–145)

## 2017-07-04 LAB — HEMOGLOBIN AND HEMATOCRIT, BLOOD
HCT: 18.6 % — ABNORMAL LOW (ref 36.0–46.0)
Hemoglobin: 5.3 g/dL — CL (ref 12.0–15.0)

## 2017-07-04 LAB — CBC
HCT: 20.3 % — ABNORMAL LOW (ref 36.0–46.0)
Hemoglobin: 5.8 g/dL — CL (ref 12.0–15.0)
MCH: 22.7 pg — ABNORMAL LOW (ref 26.0–34.0)
MCHC: 28.6 g/dL — ABNORMAL LOW (ref 30.0–36.0)
MCV: 79.3 fL (ref 78.0–100.0)
Platelets: UNDETERMINED 10*3/uL (ref 150–400)
RBC: 2.56 MIL/uL — ABNORMAL LOW (ref 3.87–5.11)
RDW: 18.8 % — ABNORMAL HIGH (ref 11.5–15.5)
WBC: 5.2 10*3/uL (ref 4.0–10.5)

## 2017-07-04 LAB — PROTIME-INR
INR: 1.6
Prothrombin Time: 18.9 seconds — ABNORMAL HIGH (ref 11.4–15.2)

## 2017-07-04 LAB — PREPARE RBC (CROSSMATCH)

## 2017-07-04 LAB — GLUCOSE, CAPILLARY
Glucose-Capillary: 64 mg/dL — ABNORMAL LOW (ref 65–99)
Glucose-Capillary: 71 mg/dL (ref 65–99)

## 2017-07-04 MED ORDER — SODIUM CHLORIDE 0.9 % IV SOLN
INTRAVENOUS | Status: DC
  Administered 2017-07-04: 19:00:00 via INTRAVENOUS

## 2017-07-04 MED ORDER — SODIUM CHLORIDE 0.9 % IV SOLN: 250.0000 mL | INTRAVENOUS | Status: DC | PRN

## 2017-07-04 MED ORDER — ACETAMINOPHEN 325 MG PO TABS
650.0000 mg | ORAL_TABLET | Freq: Four times a day (QID) | ORAL | Status: DC | PRN
  Administered 2017-07-06 – 2017-07-08 (×2): 650 mg via ORAL
  Filled 2017-07-04 (×3): qty 2

## 2017-07-04 MED ORDER — POLYVINYL ALCOHOL 1.4 % OP SOLN: 1.0000 [drp] | Freq: Three times a day (TID) | OPHTHALMIC | Status: DC | PRN

## 2017-07-04 MED ORDER — SODIUM CHLORIDE 0.9 % IV SOLN: Freq: Once | INTRAVENOUS | Status: DC

## 2017-07-04 MED ORDER — FUROSEMIDE 10 MG/ML IJ SOLN
20.0000 mg | Freq: Once | INTRAMUSCULAR | Status: AC
  Administered 2017-07-05: 20 mg via INTRAVENOUS
  Filled 2017-07-04: qty 2

## 2017-07-04 MED ORDER — ACETAMINOPHEN 325 MG PO TABS
650.0000 mg | ORAL_TABLET | Freq: Once | ORAL | Status: AC
  Administered 2017-07-04: 650 mg via ORAL
  Filled 2017-07-04: qty 2

## 2017-07-04 MED ORDER — ACETAMINOPHEN 650 MG RE SUPP: 650.0000 mg | Freq: Four times a day (QID) | RECTAL | Status: DC | PRN

## 2017-07-04 MED ORDER — PAROXETINE HCL 20 MG PO TABS
40.0000 mg | ORAL_TABLET | ORAL | Status: DC
  Administered 2017-07-05 – 2017-07-10 (×5): 40 mg via ORAL
  Filled 2017-07-04 (×5): qty 2

## 2017-07-04 MED ORDER — POTASSIUM CHLORIDE CRYS ER 20 MEQ PO TBCR
20.0000 meq | EXTENDED_RELEASE_TABLET | Freq: Two times a day (BID) | ORAL | Status: DC
  Administered 2017-07-05 – 2017-07-07 (×5): 20 meq via ORAL
  Filled 2017-07-04 (×5): qty 1

## 2017-07-04 MED ORDER — FERROUS SULFATE 325 (65 FE) MG PO TABS
325.0000 mg | ORAL_TABLET | Freq: Two times a day (BID) | ORAL | Status: DC
  Filled 2017-07-04: qty 1

## 2017-07-04 MED ORDER — FLUTICASONE FUROATE-VILANTEROL 200-25 MCG/INH IN AEPB
1.0000 | INHALATION_SPRAY | Freq: Every day | RESPIRATORY_TRACT | Status: DC
  Administered 2017-07-05 – 2017-07-10 (×5): 1 via RESPIRATORY_TRACT
  Filled 2017-07-04: qty 28

## 2017-07-04 MED ORDER — LEVOTHYROXINE SODIUM 25 MCG PO TABS
25.0000 ug | ORAL_TABLET | Freq: Every day | ORAL | Status: DC
  Administered 2017-07-05 – 2017-07-10 (×5): 25 ug via ORAL
  Filled 2017-07-04 (×6): qty 1

## 2017-07-04 MED ORDER — PROPRANOLOL HCL 20 MG PO TABS: 20.0000 mg | ORAL_TABLET | Freq: Every day | ORAL | Status: DC

## 2017-07-04 MED ORDER — METOLAZONE 5 MG PO TABS: 2.5000 mg | ORAL_TABLET | ORAL | Status: DC

## 2017-07-04 MED ORDER — DEXTROSE-NACL 5-0.9 % IV SOLN: INTRAVENOUS | Status: DC

## 2017-07-04 MED ORDER — SODIUM CHLORIDE 0.9% FLUSH: 3.0000 mL | INTRAVENOUS | Status: DC | PRN

## 2017-07-04 MED ORDER — INSULIN PUMP
SUBCUTANEOUS | Status: DC
  Administered 2017-07-05: 1 via SUBCUTANEOUS
  Administered 2017-07-06: 4.1 via SUBCUTANEOUS
  Administered 2017-07-06: 4.7 via SUBCUTANEOUS
  Administered 2017-07-07 (×2): via SUBCUTANEOUS
  Administered 2017-07-07: 1.1 via SUBCUTANEOUS
  Administered 2017-07-08 (×2): via SUBCUTANEOUS
  Administered 2017-07-09: 4.9 via SUBCUTANEOUS
  Administered 2017-07-09: 4.2 via SUBCUTANEOUS
  Administered 2017-07-09: 3.2 via SUBCUTANEOUS
  Administered 2017-07-10 (×2): via SUBCUTANEOUS
  Filled 2017-07-04: qty 1

## 2017-07-04 MED ORDER — FUROSEMIDE 10 MG/ML IJ SOLN
40.0000 mg | Freq: Two times a day (BID) | INTRAMUSCULAR | Status: DC
  Administered 2017-07-04 – 2017-07-05 (×2): 40 mg via INTRAVENOUS
  Filled 2017-07-04 (×3): qty 4

## 2017-07-04 MED ORDER — ASPIRIN 81 MG PO CHEW
81.0000 mg | CHEWABLE_TABLET | ORAL | Status: AC
  Administered 2017-07-05: 81 mg via ORAL
  Filled 2017-07-04: qty 1

## 2017-07-04 MED ORDER — METOLAZONE 2.5 MG PO TABS: 2.5000 mg | ORAL_TABLET | ORAL | 0 refills | Status: DC

## 2017-07-04 MED ORDER — POTASSIUM CHLORIDE 10 MEQ/100ML IV SOLN: 10.0000 meq | INTRAVENOUS | Status: AC

## 2017-07-04 MED ORDER — DIPHENHYDRAMINE HCL 25 MG PO CAPS
25.0000 mg | ORAL_CAPSULE | Freq: Once | ORAL | Status: AC
  Administered 2017-07-04: 25 mg via ORAL
  Filled 2017-07-04 (×2): qty 1

## 2017-07-04 MED ORDER — ROSUVASTATIN CALCIUM 10 MG PO TABS
5.0000 mg | ORAL_TABLET | ORAL | Status: DC
  Administered 2017-07-08 – 2017-07-10 (×2): 5 mg via ORAL
  Filled 2017-07-04 (×3): qty 1

## 2017-07-04 MED ORDER — POTASSIUM CHLORIDE CRYS ER 20 MEQ PO TBCR
40.0000 meq | EXTENDED_RELEASE_TABLET | Freq: Once | ORAL | Status: AC
  Administered 2017-07-05: 40 meq via ORAL
  Filled 2017-07-04: qty 2

## 2017-07-04 MED ORDER — PANTOPRAZOLE SODIUM 40 MG IV SOLR
40.0000 mg | Freq: Two times a day (BID) | INTRAVENOUS | Status: DC
  Administered 2017-07-05 – 2017-07-07 (×4): 40 mg via INTRAVENOUS
  Filled 2017-07-04 (×5): qty 40

## 2017-07-04 MED ORDER — TORSEMIDE 20 MG PO TABS: 60.0000 mg | ORAL_TABLET | Freq: Every day | ORAL | 6 refills | Status: DC

## 2017-07-04 MED ORDER — SODIUM CHLORIDE 0.9% FLUSH
3.0000 mL | Freq: Two times a day (BID) | INTRAVENOUS | Status: DC
  Administered 2017-07-04 – 2017-07-05 (×2): 3 mL via INTRAVENOUS

## 2017-07-04 MED ORDER — SODIUM CHLORIDE 0.9 % IV SOLN
INTRAVENOUS | Status: DC
  Administered 2017-07-05: 03:00:00 via INTRAVENOUS

## 2017-07-04 MED ORDER — TORSEMIDE 20 MG PO TABS: 60.0000 mg | ORAL_TABLET | Freq: Every day | ORAL | Status: DC

## 2017-07-04 MED ORDER — PROPRANOLOL HCL 20 MG PO TABS: 20.0000 mg | ORAL_TABLET | Freq: Every evening | ORAL | Status: DC | PRN

## 2017-07-04 NOTE — Consult Note (Signed)
Referring Provider: Glenda Chroman, MD Primary Care Physician:  Glenda Chroman, MD Primary Gastroenterologist:  Dr. Laural Golden  Reason for Consultation:    Anemia with hemoglobin of 5.8 g.  HPI:   Patient is 61 year old Caucasian female with multiple medical problems including cirrhosis secondary to NASH complicated by esophageal variceal bleed requiring variceal banding as well as history of iron deficiency anemia who is being evaluated by Dr. Aundra Dubin for exertional dyspnea fluid overload felt to be secondary to CHF. Patient was seen at his office earlier today. She was noted to have gained 5 pounds. She had lab studies drawn. Her hemoglobin was 5.8 g. Patient was therefore advised to contact me she did. She was admitted to hospitalist service for blood transfusion and further evaluation. Patient has history of iron deficiency anemia. Last EGD was about 2 months ago revealing small esophageal varices PHG and GAVE without stigmata of bleeding. She underwent high-risk screening colonoscopy in July 2017 revealing few small AV malformations at transverse descending and sigmoid colon without stigmata of bleeding. Examination was limited to hepatic flexure. She subsequently had virtual colonoscopy and no abnormality noted in a ascending colon and cecum. Patient has been intolerant of iron. She's been taking dose here and there but it has not helped. She was given 2 units of PRBCs by Dr. Woody Seller her primary care physician about 3 weeks ago. She does not remember pre-and post transfusion H&H. 2 days ago she received parenteral iron at this facility. She denies melena or frank rectal bleeding. Every now and then she notices blood in the tissue. She had soft brown stool today. She also denies hematuria or vaginal bleeding. She continues to experience exertional dyspnea. She was evaluated for pulmonary shunting but echo was unremarkable. In May 2018 she had chest CT without contrast and was negative for interstitial lung  disease. She states she has gained about 20 pounds in the last 2 months despite diuretic therapy. She denies abdominal pain. Lately she's had low blood sugars and insulin dose has been reduced per recommendations by her endocrinologist Dr. Bubba Camp.    Past Medical History:  Diagnosis Date  . Anemia   . CHF (congestive heart failure) ([place])   . Cirrhosis ([place])   . Depression   . Diabetes mellitus   . Esophageal bleed, variceal   . Eye hemorrhage    Behind left eye  . Fibromyalgia   . Hypercholesteremia   . Hypertension   . Hypothyroidism   . NAFLD (nonalcoholic fatty liver disease)   . Osteoarthrosis   . Osteoporosis   . PONV (postoperative nausea and vomiting)   . UTI (urinary tract infection) 11/12 end of the month   Patient feels that she passed a kidney stone at that time    Past Surgical History:  Procedure Laterality Date  . APPENDECTOMY  1980  . BILATERAL SALPINGOOPHORECTOMY    . CHOLECYSTECTOMY    . COLONOSCOPY  03/15/2011  . COLONOSCOPY N/A 03/24/2016   Procedure: COLONOSCOPY;  Surgeon: [name], MD;  [facility];  Service: Endoscopy;  Laterality: N/A;  855  . ESOPHAGEAL BANDING  04/24/2012   Procedure: ESOPHAGEAL BANDING;  Surgeon: [name], MD;  [facility];  Service: Endoscopy;  Laterality: N/A;  . Esophageal BANDING  08/03/2012   [facility] in [place] , [place]  09/24/2012   Procedure: ESOPHAGEAL BANDING;  Surgeon: [name], MD;  Location: [facility];  Service: Endoscopy;  Laterality:  N/A;  Banding x 3  . ESOPHAGEAL BANDING N/A 01/29/2013   Procedure: ESOPHAGEAL BANDING;  Surgeon: [name], MD;  [facility];  Service: Endoscopy;  Laterality: N/A;  . ESOPHAGEAL BANDING N/A 06/19/2014   Procedure: ESOPHAGEAL BANDING;  Surgeon: [name], MD;  [facility];  Service: Endoscopy;  Laterality: N/A;  . ESOPHAGEAL BANDING N/A 01/05/2016   Procedure: ESOPHAGEAL BANDING;   Surgeon: [name], MD;  [facility];  Service: Endoscopy;  Laterality: N/A;  . ESOPHAGEAL BANDING N/A 08/03/2016   Procedure: ESOPHAGEAL BANDING;  Surgeon: [name], MD;  [facility];  Service: Endoscopy;  Laterality: N/A;  . ESOPHAGEAL BANDING N/A 05/02/2017   Procedure: ESOPHAGEAL BANDING;  Surgeon: [name], MD;  [facility];  Service: Endoscopy;  Laterality: N/A;  . ESOPHAGOGASTRODUODENOSCOPY  04/24/2012   Procedure: ESOPHAGOGASTRODUODENOSCOPY (EGD);  Surgeon: [name], MD;  [facility];  Service: Endoscopy;  Laterality: N/A;  300  . ESOPHAGOGASTRODUODENOSCOPY N/A 01/29/2013   Procedure: ESOPHAGOGASTRODUODENOSCOPY (EGD);  Surgeon: [name], MD;  [facility];  Service: Endoscopy;  Laterality: N/A;  1200  . ESOPHAGOGASTRODUODENOSCOPY N/A 05/22/2013   Procedure: ESOPHAGOGASTRODUODENOSCOPY (EGD);  Surgeon: [name], MD;  [facility];  Service: Endoscopy;  Laterality: N/A;  1:55  . ESOPHAGOGASTRODUODENOSCOPY N/A 12/31/2013   Procedure: ESOPHAGOGASTRODUODENOSCOPY (EGD);  Surgeon: [name], MD;  [facility];  Service: Endoscopy;  Laterality: N/A;  1200  . ESOPHAGOGASTRODUODENOSCOPY N/A 06/19/2014   Procedure: ESOPHAGOGASTRODUODENOSCOPY (EGD);  Surgeon: [name], MD;  [facility];  Service: Endoscopy;  Laterality: N/A;  1055  . ESOPHAGOGASTRODUODENOSCOPY N/A 01/15/2015   Procedure: ESOPHAGOGASTRODUODENOSCOPY (EGD);  Surgeon: [name], MD;  [facility];  Service: Endoscopy;  Laterality: N/A;  730 - moved to 9:45 - moved to 1250-Ann notified pt  . ESOPHAGOGASTRODUODENOSCOPY N/A 06/30/2015   Procedure: ESOPHAGOGASTRODUODENOSCOPY (EGD);  Surgeon: [name], MD;  [facility];  Service: Endoscopy;  Laterality: N/A;  1200  . ESOPHAGOGASTRODUODENOSCOPY N/A 01/05/2016   Procedure: ESOPHAGOGASTRODUODENOSCOPY (EGD);  Surgeon: [name], MD;  [facility];  Service: Endoscopy;  Laterality: N/A;  830  . ESOPHAGOGASTRODUODENOSCOPY N/A 08/03/2016   Procedure: ESOPHAGOGASTRODUODENOSCOPY (EGD);  Surgeon: [name], MD;  [facility];  Service: Endoscopy;  Laterality: N/A;  930  . ESOPHAGOGASTRODUODENOSCOPY N/A 05/02/2017   Procedure: ESOPHAGOGASTRODUODENOSCOPY (EGD);  Surgeon: [name], MD;  [facility];  Service: Endoscopy;  Laterality: N/A;  12:00  . ESOPHAGOGASTRODUODENOSCOPY (EGD) WITH PROPOFOL  09/24/2012   Procedure: ESOPHAGOGASTRODUODENOSCOPY (EGD) WITH PROPOFOL;  Surgeon: [name], MD;  Location: [facility];  Service: Endoscopy;  Laterality: N/A;  GE junction at 36  . ESOPHAGOGASTRODUODENOSCOPY W/ BANDING  08/2010  . POLYPECTOMY  03/24/2016   Procedure: POLYPECTOMY;  Surgeon: [name], MD;  [facility];  Service: Endoscopy;;  sigmoid polyp  . RIGHT HEART CATH N/A 04/16/2017   Procedure: Right Heart Cath;  Surgeon: [name], MD;  Location: [place] [facility];  Service: Cardiovascular;  Laterality: N/A;  . TONSILLECTOMY    . UPPER GASTROINTESTINAL ENDOSCOPY  03/15/2011   EGD ED BANDING/TCS  . UPPER GASTROINTESTINAL ENDOSCOPY  09/05/2010  . UPPER GASTROINTESTINAL ENDOSCOPY  08/11/2010  . VAGINAL HYSTERECTOMY      Prior to Admission medications   Medication Sig Start Date End Date Taking?  Authorizing Provider  cholecalciferol (VITAMIN D) 1000 UNITS tablet Take 1,000 Units by mouth every Monday, Wednesday, and Friday.     [provider]  ferrous sulfate 325 (65 FE) MG tablet Take 325 mg by mouth 2 (two) times daily with a meal.    [provider]  fluticasone furoate-vilanterol (BREO ELLIPTA) 200-25 MCG/INH AEPB Inhale 1 puff into the lungs daily. 02/22/17   Magdalen Spatz, NP  hydroxypropyl methylcellulose / hypromellose (ISOPTO TEARS / GONIOVISC) 2.5 % ophthalmic solution Place 1 drop into both eyes 3 (three) times daily as  needed for dry eyes.    [provider]  Insulin Human (INSULIN PUMP) SOLN Inject into the skin daily.    [provider]  insulin lispro (HUMALOG) 100 UNIT/ML injection Inject into the skin 3 (three) times daily before meals. RATE AS FOLLOWS: 1200-0300 3.55 UNITS/HR, 0300-0830=3.70 UNITS/HR, 0830-1430=5.05 UNITS/HR, 1430-1900=5.10 UNITS/HR, & 1900-2359=4.65 UNITS/HR. Patient may bolus as needed with meals.    [provider]  levothyroxine (SYNTHROID, LEVOTHROID) 25 MCG tablet Take 25 mcg by mouth daily before breakfast.     [provider]  metolazone (ZAROXOLYN) 2.5 MG tablet Take 1 tablet (2.5 mg total) by mouth as directed. Call CHF clinic [phone]) before taking. 07/04/17 10/02/17  Shirley Friar, PA-C  OXYGEN Inhale 2 L into the lungs every evening. WITH SLEEP.    [provider]  pantoprazole (PROTONIX) 40 MG tablet Take 1 tablet by mouth once daily 02/06/17   Mabrey Howland, Mechele Dawley, MD  PARoxetine (PAXIL) 40 MG tablet Take 40 mg by mouth every morning.    [provider]  potassium chloride SA (K-DUR,KLOR-CON) 20 MEQ tablet Take 20 mEq by mouth 2 (two) times daily.    [provider]  propranolol (INDERAL) 20 MG tablet Take 20 mg by mouth See admin instructions. Take 1 tablet (20 mg) by mouth scheduled in the morning; may repeat dose in the evening if needed for elevated blood pressure.    [provider]  rosuvastatin (CRESTOR) 5 MG tablet Take 1 tablet (5 mg total) by mouth every other day. 06/27/17 09/25/17  [name], MD  torsemide (DEMADEX) 20 MG tablet Take 3 tablets (60 mg total) by mouth daily. 07/04/17 10/02/17  Shirley Friar, PA-C    Current Facility-Administered Medications  Medication Dose Route Frequency Provider Last Rate Last Dose  . 0.9 %  sodium chloride infusion   Intravenous Once Jani Gravel, MD      . acetaminophen (TYLENOL) tablet 650 mg  650 mg Oral Q6H PRN Jani Gravel, MD        Or  . acetaminophen (TYLENOL) suppository 650 mg  650 mg Rectal Q6H PRN Jani Gravel, MD      . acetaminophen (TYLENOL) tablet 650 mg  650 mg Oral Once Jani Gravel, MD      . diphenhydrAMINE (BENADRYL) capsule 25 mg  25 mg Oral Once Jani Gravel, MD      . ferrous sulfate tablet 325 mg  325 mg Oral BID WC Jani Gravel, MD      . fluticasone furoate-vilanterol (BREO ELLIPTA) 200-25 MCG/INH 1 puff  1 puff Inhalation Daily Jani Gravel, MD      . furosemide (LASIX) injection 20 mg  20 mg Intravenous Once Jani Gravel, MD      . furosemide (LASIX) injection 40 mg  40 mg Intravenous Q12H Jani Gravel, MD      . insulin pump   Subcutaneous Q4H Jani Gravel, MD      . [  START ON 07/05/2017] levothyroxine (SYNTHROID, LEVOTHROID) tablet 25 mcg  25 mcg Oral QAC breakfast Jani Gravel, MD      . pantoprazole (PROTONIX) injection 40 mg  40 mg Intravenous Q12H Jani Gravel, MD      . Derrill Memo ON 07/05/2017] PARoxetine (PAXIL) tablet 40 mg  40 mg Oral Zara Chess, MD      . polyvinyl alcohol (LIQUIFILM TEARS) 1.4 % ophthalmic solution 1 drop  1 drop Both Eyes TID PRN Jani Gravel, MD      . potassium chloride SA (K-DUR,KLOR-CON) CR tablet 20 mEq  20 mEq Oral BID Jani Gravel, MD      . propranolol (INDERAL) tablet 20 mg  20 mg Oral Daily Jani Gravel, MD      . propranolol (INDERAL) tablet 20 mg  20 mg Oral QHS PRN Jani Gravel, MD      . rosuvastatin (CRESTOR) tablet 5 mg  5 mg Oral Oretha Milch, MD        Allergies as of 07/04/2017  . (No Known Allergies)    Family History  Problem Relation Age of Onset  . Lung cancer Mother   . Diabetes Father   . Diabetes Sister   . Hypertension Sister   . Hypothyroidism Sister   . Colon cancer Brother   . Diabetes Sister   . Hypothyroidism Brother   . Healthy Daughter   . Obesity Daughter   . Hypertension Daughter     Social History   Social History  . Marital status: Married    Spouse name: N/A  . Number of children: N/A  . Years of education: N/A   Occupational  History  . retired     Social History Main Topics  . Smoking status: Never Smoker  . Smokeless tobacco: Never Used  . Alcohol use No  . Drug use: No  . Sexual activity: Not on file   Other Topics Concern  . Not on file   Social History Narrative   Lives with husband     Review of Systems: See HPI, otherwise normal ROS  Physical Exam: Temp:  [98.7 F (37.1 C)] 98.7 F (37.1 C) (10/17 1753) Pulse Rate:  [73-77] 73 (10/17 1753) Resp:  [18] 18 (10/17 1753) BP: (107-134)/(42-68) 107/42 (10/17 1753) SpO2:  [95 %-98 %] 95 % (10/17 1753) Weight:  [257 lb 6.4 oz (116.8 kg)-258 lb 13.1 oz (117.4 kg)] 258 lb 13.1 oz (117.4 kg) (10/17 1753)   Patient is alert and in no acute distress. She appears pale. Conjunctiva is also pale. Sclerae nonicteric. Oropharyngeal mucosa is normal. No neck masses or thyromegaly noted. Cardiac exam with regular rhythm normal S1 and S2. Grade 2/6 systolic ejection murmur noted at upper left sternal border. Lungs are clear to auscultation. Abdomen is full. Bowel sounds are normal. On palpation it is soft and nontender. No organomegaly or masses. No abdominal wall edema noted. She has mild sacral edema. He has 2+ pitting edema involving both legs.  Lab Results:  Recent Labs  07/04/17 1156  WBC 5.2  HGB 5.8*  HCT 20.3*  PLT PLATELET CLUMPS NOTED ON SMEAR, UNABLE TO ESTIMATE   BMET  Recent Labs  07/04/17 1156  NA 138  K 3.2*  CL 108  CO2 23  GLUCOSE 87  BUN 10  CREATININE 0.95  CALCIUM 8.1*    PT/INR  Recent Labs  07/04/17 1156  LABPROT 18.9*  INR 1.60     Assessment;  #1. Profound anemia. Patient had routine  blood work and Dr. Claris Gladden office earlier today and was noted to have hemoglobin of 5.8 g. Patient has well-documented history of iron deficiency anemia. She also has history of esophageal variceal bleed(November 2011 and November 2013). She has been banded on multiple occasions. Last EGD was about 2 months ago revealing  small esophageal varices, portal gastropathy and GAVE for which she has never bled. Last colonoscopy was in July 2017 revealing a few AV malformations at transverse descending and sigmoid colon without bleeding. Patient received 2 units of PRBCs about 3 weeks ago at at [facility] She received iron infusion at this facility 2 days ago.  Patient has not experienced overt GI bleed. It is possible that she has been losing blood chronically either from gave colonic AV malformations or from small bowel lesions. I do not leave EGD would be helpful in her management. Small bowel needs to be evaluated with given capsule.  #2. Cirrhosis secondary to NASH complicated by esophageal varices as well as splenomegaly and thrombocytopenia. She has been evaluated at [facility] for liver transplant but presently not on active list.  #3. Fluid overload. Fluid overload felt to be due to CHF and right-sided failure. She is not responding to diuretic therapy and this may be due to profound anemia. Hopefully she can be aggressively diuresed while she is in the hospital. Patient is on nonselective beta blocker primarily for secondary prophylaxis for variceal bleed. Given cardiopulmonary issues this medication needs to be stopped.  #4. Thrombocytopenia. Thrombocytopenia secondary to cirrhosis and splenomegaly. Reported platelet count is lower than it should be because of clumping.  Recommendations;  Hemoccult 1. Discontinue ferrous sulfate and propranolol. Will check H&H this evening. Proceed with transfusion as planned. Small bowel given capsule study in a.m.        LOS: 0 days   Oral Remache  07/04/2017, 6:29 PM

## 2017-07-04 NOTE — Progress Notes (Signed)
Advanced Heart Failure Clinic Note   PCP: Dr. Woody Seller HF Cardiology: Dr. Aundra Dubin  61 yo sent for CHF clinic evaluation due to progressive dyspnea.  Patient has been having exertional dypsnea for a year or so.  She had been short of breath walking about 100 yards.  Echo in 3/18 showed mild LVH and mild MR with normal EF.  High resolution CT lungs in 5/18 did not have evidence for interstitial lung disease.   She additionally has history of NASH with cirrhosis and variceal bleeding as well as splenomegaly, followed by Dr. Laural Golden.   She was sent for right heart cath as there was worry for possible portopulmonary hypertension.  RHC showed elevated right and left heart filling pressures with pulmonary venous hypertension.  She had been taking Lasix prn.  After the cath, Lasix increased to 40 mg daily.   She presents today for 2 week follow up. At last visit lasix increased. She is up another 5 lbs.  She had an iron infusion earlier this week and has felt worse since, with occasional chest pressures (especially when she is SOB.).  She is SOB with ADLs including bathing and dressing.  Chronic 2 pillow orthopnea. No BRBPR/melena to her knowledge. She is tired and frustrated of feeling bad. Feels like urine output has tapered off on lasix.   Labs (7/18): K 3.9, creatinine 0.8, hgb 8.1                                            Review of systems complete and found to be negative unless listed in HPI.                                                                  PMH:  1. NASH with cirrhosis: H/o variceal bleeding.  2. Splenomegaly related to cirrhosis.  3. Sleep study 6/8 showed no significant OSA.  4. High resolution CT chest (5/18): No interstitial lung disease, +calcium in coronaries.  5. Chronic diastolic CHF:  - Echo (2/42): EF 60-65%, mild LVH, mild MR.  Negative bubble study.  - RHC (7/18): mean RA 16, PA 64/25, mean PCWP 21, CI 4.16, PVR 2.6 WU.  6. Anemia 7. Thrombocytopenia (likely related  to splenomegaly).   Social History   Social History  . Marital status: Married    Spouse name: N/A  . Number of children: N/A  . Years of education: N/A   Occupational History  . retired     Social History Main Topics  . Smoking status: Never Smoker  . Smokeless tobacco: Never Used  . Alcohol use No  . Drug use: No  . Sexual activity: Not on file   Other Topics Concern  . Not on file   Social History Narrative   Lives with husband    Family History  Problem Relation Age of Onset  . Lung cancer Mother   . Diabetes Father   . Diabetes Sister   . Hypertension Sister   . Hypothyroidism Sister   . Colon cancer Brother   . Diabetes Sister   . Hypothyroidism Brother   . Healthy Daughter   . Obesity Daughter   .  Hypertension Daughter    Current Outpatient Prescriptions  Medication Sig Dispense Refill  . cholecalciferol (VITAMIN D) 1000 UNITS tablet Take 1,000 Units by mouth every Monday, Wednesday, and Friday.     . ferrous sulfate 325 (65 FE) MG tablet Take 325 mg by mouth 2 (two) times daily with a meal.    . fluticasone furoate-vilanterol (BREO ELLIPTA) 200-25 MCG/INH AEPB Inhale 1 puff into the lungs daily. 1 each 3  . furosemide (LASIX) 40 MG tablet Take 1 tablet (40 mg total) by mouth 2 (two) times daily. 9030 tablet 3  . hydroxypropyl methylcellulose / hypromellose (ISOPTO TEARS / GONIOVISC) 2.5 % ophthalmic solution Place 1 drop into both eyes 3 (three) times daily as needed for dry eyes.    . Insulin Human (INSULIN PUMP) SOLN Inject into the skin daily.    . insulin lispro (HUMALOG) 100 UNIT/ML injection Inject into the skin 3 (three) times daily before meals. RATE AS FOLLOWS: 1200-0300 3.55 UNITS/HR, 0300-0830=3.70 UNITS/HR, 0830-1430=5.05 UNITS/HR, 1430-1900=5.10 UNITS/HR, & 1900-2359=4.65 UNITS/HR. Patient may bolus as needed with meals.    Marland Kitchen levothyroxine (SYNTHROID, LEVOTHROID) 25 MCG tablet Take 25 mcg by mouth daily before breakfast.     . OXYGEN Inhale 2 L  into the lungs every evening. WITH SLEEP.    Marland Kitchen pantoprazole (PROTONIX) 40 MG tablet Take 1 tablet by mouth once daily 90 tablet 3  . PARoxetine (PAXIL) 40 MG tablet Take 40 mg by mouth every morning.    . potassium chloride SA (K-DUR,KLOR-CON) 20 MEQ tablet Take 20 mEq by mouth 2 (two) times daily.    . propranolol (INDERAL) 20 MG tablet Take 20 mg by mouth See admin instructions. Take 1 tablet (20 mg) by mouth scheduled in the morning; may repeat dose in the evening if needed for elevated blood pressure.    . rosuvastatin (CRESTOR) 5 MG tablet Take 1 tablet (5 mg total) by mouth every other day. 45 tablet 3   No current facility-administered medications for this encounter.    Vitals:   07/04/17 1123  BP: 134/68  Pulse: 77  SpO2: 98%  Weight: 257 lb 6.4 oz (116.8 kg)   Wt Readings from Last 3 Encounters:  07/04/17 257 lb 6.4 oz (116.8 kg)  07/02/17 252 lb (114.3 kg)  06/11/17 252 lb 12.8 oz (114.7 kg)    General: Chronically ill and elderly appearing. No resp difficulty. HEENT: Normal Neck: Supple. JVP to jaw. Carotids 2+ bilat; no bruits. No thyromegaly or nodule noted. Cor: PMI nondisplaced. RRR, 2/6 SEM RUSB.  Lungs: CTAB, normal effort. Abdomen: Soft, non-tender, non-distended, no HSM. No bruits or masses. +BS  Extremities: No cyanosis, clubbing, or rash. 1-2+ edema to knees. Trace thigh edema.  Neuro: Alert & orientedx3, cranial nerves grossly intact. moves all 4 extremities w/o difficulty. Affect pleasant   Assessment/Plan: 1. Chronic diastolic CHF: RHC in 6/81 is suggestive of significant diastolic CHF.  EF was normal by echo. - Volume status elevated on exam.  - NYHA III-IIIb symptoms - Stop lasix. Start torsemide 60 mg daily started today.  - Will send prescription for 2.5 mg metolazone to take AS DIRECTED by HF clinic. Have asked her to call back on Friday if not feeling better for instructions.  - Reinforced fluid restriction to < 2 L daily, sodium restriction to less  than 2000 mg daily, and the importance of daily weights.   2. Pulmonary hypertension: Patient is at risk for portopulmonary hypertension.   However, [place] in 7/18 showed pulmonary venous  hypertension (group 2).   - Discussed with Dr. Aundra Dubin. Will plan for RHC next week.  3. Cirrhosis: NASH with splenomegaly, thrombocytopenia.  4. CAD: Coronary calcium on CT chest in 5/18.   - In absence of chest pain, would not do stress test or cath => poor candidate for invasive cardiac evaluation and stent placement with very low platelets.  - No s/s of ischemia.  She has chest pressure at times with SOB, but she is very volume overloaded on exam.  - Lipids OK last visit. Continue crestor 5. Anemia:  - Per her GI MD.  - Getting Iron infusions and Blood prn.  6. Thrombocytopenia: Likely related to cirrhosis and splenomegaly. No change.   Switch to torsemide as above. RHC next week. Pre-cath labs today. Follow up 2 weeks to re-assess volume status and repeat labs.   Shirley Friar, PA-C  07/04/2017   Greater than 50% of the 25 minute visit was spent in counseling/coordination of care regarding disease state educations, sliding scale diuretics, salt/fluid restriction, and medication reconciliation.

## 2017-07-04 NOTE — Telephone Encounter (Signed)
Patient called and told me that her hemoglobin was 5.8. She was seen by the cardiologist this morning and had blood work. She does not have postural symptoms. She has been feeling tired.she has not experienced melena or frank rectal bleeding. She received parenteral iron 2 days ago. Patient will need to be hospitalized for blood transfusion and further evaluation. She has history of esophageal variceal bleed. Last EGD was put in varices were small and did not require banding. She does have GAVE but has never bled from it. Talked with Dr. Roderic Palau was kindly agreed to admit her to his service. We will see patient later today and determine if she needs EGD or small bowel given capsule study.

## 2017-07-04 NOTE — Telephone Encounter (Signed)
CBC  Order: [id number]  Status:  Final result Visible to patient:  Yes (MyChart) Next appt:  07/09/2017 at 09:00 AM in No Specialty ([facility] INJECTION ROOM) Dx:  Chronic diastolic heart failure ([place])  Notes recorded by Effie Berkshire, RN on 07/04/2017 at 2:04 PM EDT Patient aware and agreeable, plans to go to [facility] at this time. ------  Notes recorded by Darron Doom, RN on 07/04/2017 at 1:55 PM EDT Tried calling patient's home number again but had to leave another message. I tried calling patient's husband's cell number [phone] but no answer and no VM setup. Will continue trying to reach patient. ------  Notes recorded by Darron Doom, RN on 07/04/2017 at 1:20 PM EDT Called and left message for her to call us back. Will try again this afternoon. ------  Notes recorded by Shirley Friar, PA-C on 07/04/2017 at 1:09 PM EDT Please have her report to ED ASAP.     Thank you.   [name] [address] [place], PA-C 07/04/2017 1:08 PM

## 2017-07-04 NOTE — H&P (Addendum)
TRH H&P   Patient Demographics:    Maria [name], is a 61 y.o. female  MRN: [id number]   DOB - July 07, 1956  Admit Date - 07/04/2017  Outpatient Primary MD for the patient is Glenda Chroman, MD  Referring MD/NP/PA:  Dr. Laural Golden  Outpatient Specialists:  Laural Golden (gastroenterology)   Patient coming from: home  No chief complaint on file.     HPI:    Maria [name]  is a 61 y.o. female, w Hypothyroidism,CHF (60-65%),  Dm2, NAFLD, Cirrhosis, w eosophageal varices, and mild portal hypertensive gastropathy near the gastric fundus and body, apparently had routine labs and Hgb 5.8.  Pt notes that she had transfusion by Dr. Woody Seller severeal weeks ago.  Pt notes some rare brbpr, which she attributes to hemorrhoids,  Pt states that her stool is black and has some diarrhea recently, which might have irritated her hemorrhoids.  [facility] was sent to [facility] for direct admission.   In [facility], VSS , afebrile.  Potassium repleted.  Pt mentions that her diuretic wasn't working as well recently and this was adjusted upwards by cardiology and then lasix was changed to Torsemide and Demadex.   Pt will be admitted for anemia.    Review of systems:    In addition to the HPI above,  No Fever-chills, No Headache, No changes with Vision or hearing, No problems swallowing food or Liquids, No Chest pain, Cough or Shortness of Breath, No Abdominal pain, Slight loose stool.  No Blood in Urine, No dysuria, No new skin rashes or bruises, No new joints pains-aches,  No new weakness, tingling, numbness in any extremity, No recent weight gain or loss, No polyuria, polydypsia or polyphagia, No significant Mental Stressors.  A full 10 point Review of Systems was done, except as stated above, all other Review of Systems were negative.   With Past History of the following :    Past Medical History:  Diagnosis Date   . Anemia   . CHF (congestive heart failure) ([place])   . Cirrhosis ([place])   . Depression   . Diabetes mellitus   . Esophageal bleed, non-variceal   . Eye hemorrhage    Behind left eye  . Fibromyalgia   . Hypercholesteremia   . Hypertension   . Hypothyroidism   . NAFLD (nonalcoholic fatty liver disease)   . Osteoarthrosis   . Osteoporosis   . PONV (postoperative nausea and vomiting)   . UTI (urinary tract infection) 11/12 end of the month   Patient feels that she passed a kidney stone at that time      Past Surgical History:  Procedure Laterality Date  . APPENDECTOMY  1980  . BILATERAL SALPINGOOPHORECTOMY    . CHOLECYSTECTOMY    . COLONOSCOPY  03/15/2011  . COLONOSCOPY N/A 03/24/2016   Procedure: COLONOSCOPY;  Surgeon: [name], MD;  Location[facility];  Service: Endoscopy;  Laterality: N/A;  855  . ESOPHAGEAL BANDING  04/24/2012   Procedure: ESOPHAGEAL BANDING;  Surgeon: [name], MD;  Location: [facility];  Service: Endoscopy;  Laterality: N/A;  . Esophageal BANDING  08/03/2012   [facility] in [place] , [place]  09/24/2012   Procedure: ESOPHAGEAL BANDING;  Surgeon: [name], MD;  Location: [facility] ORS;  Service: Endoscopy;  Laterality: N/A;  Banding x 3  . ESOPHAGEAL BANDING N/A 01/29/2013   Procedure: ESOPHAGEAL BANDING;  Surgeon: [name], MD;  [facility];  Service: Endoscopy;  Laterality: N/A;  . ESOPHAGEAL BANDING N/A 06/19/2014   Procedure: ESOPHAGEAL BANDING;  Surgeon: [name], MD;  [facility];  Service: Endoscopy;  Laterality: N/A;  . ESOPHAGEAL BANDING N/A 01/05/2016   Procedure: ESOPHAGEAL BANDING;  Surgeon: [name], MD;  [facility];  Service: Endoscopy;  Laterality: N/A;  . ESOPHAGEAL BANDING N/A 08/03/2016   Procedure: ESOPHAGEAL BANDING;  Surgeon: [name], MD;  [facility];  Service: Endoscopy;  Laterality: N/A;  . ESOPHAGEAL BANDING  N/A 05/02/2017   Procedure: ESOPHAGEAL BANDING;  Surgeon: [name], MD;  [facility];  Service: Endoscopy;  Laterality: N/A;  . ESOPHAGOGASTRODUODENOSCOPY  04/24/2012   Procedure: ESOPHAGOGASTRODUODENOSCOPY (EGD);  Surgeon: [name], MD;  [facility];  Service: Endoscopy;  Laterality: N/A;  300  . ESOPHAGOGASTRODUODENOSCOPY N/A 01/29/2013   Procedure: ESOPHAGOGASTRODUODENOSCOPY (EGD);  Surgeon: [name], MD;  [facility];  Service: Endoscopy;  Laterality: N/A;  1200  . ESOPHAGOGASTRODUODENOSCOPY N/A 05/22/2013   Procedure: ESOPHAGOGASTRODUODENOSCOPY (EGD);  Surgeon: [name], MD;  [facility];  Service: Endoscopy;  Laterality: N/A;  1:55  . ESOPHAGOGASTRODUODENOSCOPY N/A 12/31/2013   Procedure: ESOPHAGOGASTRODUODENOSCOPY (EGD);  Surgeon: [name], MD;  [facility];  Service: Endoscopy;  Laterality: N/A;  1200  . ESOPHAGOGASTRODUODENOSCOPY N/A 06/19/2014   Procedure: ESOPHAGOGASTRODUODENOSCOPY (EGD);  Surgeon: [name], MD;  [facility];  Service: Endoscopy;  Laterality: N/A;  1055  . ESOPHAGOGASTRODUODENOSCOPY N/A 01/15/2015   Procedure: ESOPHAGOGASTRODUODENOSCOPY (EGD);  Surgeon: [name], MD;  [facility];  Service: Endoscopy;  Laterality: N/A;  730 - moved to 9:45 - moved to 1250-Ann notified pt  . ESOPHAGOGASTRODUODENOSCOPY N/A 06/30/2015   Procedure: ESOPHAGOGASTRODUODENOSCOPY (EGD);  Surgeon: [name], MD;  [facility];  Service: Endoscopy;  Laterality: N/A;  1200  . ESOPHAGOGASTRODUODENOSCOPY N/A 01/05/2016   Procedure: ESOPHAGOGASTRODUODENOSCOPY (EGD);  Surgeon: [name], MD;  [facility];  Service: Endoscopy;  Laterality: N/A;  830  . ESOPHAGOGASTRODUODENOSCOPY N/A 08/03/2016   Procedure: ESOPHAGOGASTRODUODENOSCOPY (EGD);  Surgeon: [name], MD;  [facility];  Service: Endoscopy;  Laterality: N/A;  930  .  ESOPHAGOGASTRODUODENOSCOPY N/A 05/02/2017   Procedure: ESOPHAGOGASTRODUODENOSCOPY (EGD);  Surgeon: [name], MD;  [facility];  Service: Endoscopy;  Laterality: N/A;  12:00  . ESOPHAGOGASTRODUODENOSCOPY (EGD) WITH PROPOFOL  09/24/2012   Procedure: ESOPHAGOGASTRODUODENOSCOPY (EGD) WITH PROPOFOL;  Surgeon: [name], MD;  Location: [facility];  Service: Endoscopy;  Laterality: N/A;  GE junction at 36  . ESOPHAGOGASTRODUODENOSCOPY W/ BANDING  08/2010  . POLYPECTOMY  03/24/2016   Procedure: POLYPECTOMY;  Surgeon: [name], MD;  [facility];  Service: Endoscopy;;  sigmoid polyp  . RIGHT HEART CATH N/A 04/16/2017   Procedure:  Right Heart Cath;  Surgeon: [name], MD;  Location: [place] [facility];  Service: Cardiovascular;  Laterality: N/A;  . TONSILLECTOMY    . UPPER GASTROINTESTINAL ENDOSCOPY  03/15/2011   EGD ED BANDING/TCS  . UPPER GASTROINTESTINAL ENDOSCOPY  09/05/2010  . UPPER GASTROINTESTINAL ENDOSCOPY  08/11/2010  . VAGINAL HYSTERECTOMY        Social History:     Social History  Substance Use Topics  . Smoking status: Never Smoker  . Smokeless tobacco: Never Used  . Alcohol use No     Lives - at home  Mobility - walks by self   Family History :     Family History  Problem Relation Age of Onset  . Lung cancer Mother   . Diabetes Father   . Diabetes Sister   . Hypertension Sister   . Hypothyroidism Sister   . Colon cancer Brother   . Diabetes Sister   . Hypothyroidism Brother   . Healthy Daughter   . Obesity Daughter   . Hypertension Daughter       Home Medications:   Prior to Admission medications   Medication Sig Start Date End Date Taking? Authorizing Provider  cholecalciferol (VITAMIN D) 1000 UNITS tablet Take 1,000 Units by mouth every Monday, Wednesday, and Friday.     [provider]  ferrous sulfate 325 (65 FE) MG tablet Take 325 mg by mouth 2 (two) times daily with a meal.    [provider]    fluticasone furoate-vilanterol (BREO ELLIPTA) 200-25 MCG/INH AEPB Inhale 1 puff into the lungs daily. 02/22/17   Magdalen Spatz, NP  hydroxypropyl methylcellulose / hypromellose (ISOPTO TEARS / GONIOVISC) 2.5 % ophthalmic solution Place 1 drop into both eyes 3 (three) times daily as needed for dry eyes.    [provider]  Insulin Human (INSULIN PUMP) SOLN Inject into the skin daily.    [provider]  insulin lispro (HUMALOG) 100 UNIT/ML injection Inject into the skin 3 (three) times daily before meals. RATE AS FOLLOWS: 1200-0300 3.55 UNITS/HR, 0300-0830=3.70 UNITS/HR, 0830-1430=5.05 UNITS/HR, 1430-1900=5.10 UNITS/HR, & 1900-2359=4.65 UNITS/HR. Patient may bolus as needed with meals.    [provider]  levothyroxine (SYNTHROID, LEVOTHROID) 25 MCG tablet Take 25 mcg by mouth daily before breakfast.     [provider]  metolazone (ZAROXOLYN) 2.5 MG tablet Take 1 tablet (2.5 mg total) by mouth as directed. Call CHF clinic [phone]) before taking. 07/04/17 10/02/17  Shirley Friar, PA-C  OXYGEN Inhale 2 L into the lungs every evening. WITH SLEEP.    [provider]  pantoprazole (PROTONIX) 40 MG tablet Take 1 tablet by mouth once daily 02/06/17   Rehman, Mechele Dawley, MD  PARoxetine (PAXIL) 40 MG tablet Take 40 mg by mouth every morning.    [provider]  potassium chloride SA (K-DUR,KLOR-CON) 20 MEQ tablet Take 20 mEq by mouth 2 (two) times daily.    [provider]  propranolol (INDERAL) 20 MG tablet Take 20 mg by mouth See admin instructions. Take 1 tablet (20 mg) by mouth scheduled in the morning; may repeat dose in the evening if needed for elevated blood pressure.    [provider]  rosuvastatin (CRESTOR) 5 MG tablet Take 1 tablet (5 mg total) by mouth every other day. 06/27/17 09/25/17  [name], MD  torsemide (DEMADEX) 20 MG tablet Take 3 tablets (60 mg total) by mouth daily. 07/04/17 10/02/17  Shirley Friar, PA-C     Allergies:  No Known Allergies   Physical Exam:   Vitals  There were no vitals taken for this visit.   1. General  lying in bed in NAD,    2. Normal affect and insight, Not Suicidal or Homicidal, Awake Alert, Oriented X 3.  3. No F.N deficits, ALL C.Nerves Intact, Strength 5/5 all 4 extremities, Sensation intact all 4 extremities, Plantars down going.  4. Ears and Eyes appear  Normal, Conjunctivae pale  PERRLA. Moist Oral Mucosa.  5. Supple Neck, No JVD, No cervical lymphadenopathy appriciated, No Carotid Bruits.  6. Symmetrical Chest wall movement, Good air movement bilaterally, CTAB.  7. RRR, No Gallops, Rubs or Murmurs, No Parasternal Heave.  8. Positive Bowel Sounds, Abdomen Soft, No tenderness, No organomegaly appriciated,No rebound -guarding or rigidity.  9.  No Cyanosis, Normal Skin Turgor, Skin pale  10. Good muscle tone,  joints appear normal , no effusions, Normal ROM.  11. No Palpable Lymph Nodes in Neck or Axillae     Data Review:    CBC  Recent Labs Lab 07/04/17 1156  WBC 5.2  HGB 5.8*  HCT 20.3*  PLT PLATELET CLUMPS NOTED ON SMEAR, UNABLE TO ESTIMATE  MCV 79.3  MCH 22.7*  MCHC 28.6*  RDW 18.8*   ------------------------------------------------------------------------------------------------------------------  Chemistries   Recent Labs Lab 07/04/17 1156  NA 138  K 3.2*  CL 108  CO2 23  GLUCOSE 87  BUN 10  CREATININE 0.95  CALCIUM 8.1*   ------------------------------------------------------------------------------------------------------------------ estimated creatinine clearance is 77.1 mL/min (by C-G formula based on SCr of 0.95 mg/dL). ------------------------------------------------------------------------------------------------------------------ No results for input(s): TSH, T4TOTAL, T3FREE, THYROIDAB in the last 72 hours.  Invalid input(s): FREET3  Coagulation profile  Recent Labs Lab  07/04/17 1156  INR 1.60   ------------------------------------------------------------------------------------------------------------------- No results for input(s): DDIMER in the last 72 hours. -------------------------------------------------------------------------------------------------------------------  Cardiac Enzymes No results for input(s): CKMB, TROPONINI, MYOGLOBIN in the last 168 hours.  Invalid input(s): CK ------------------------------------------------------------------------------------------------------------------    Component Value Date/Time   BNP 59.1 04/24/2017 1450     ---------------------------------------------------------------------------------------------------------------  Urinalysis No results found for: COLORURINE, APPEARANCEUR, LABSPEC, PHURINE, GLUCOSEU, HGBUR, BILIRUBINUR, KETONESUR, PROTEINUR, UROBILINOGEN, NITRITE, LEUKOCYTESUR  ----------------------------------------------------------------------------------------------------------------   Imaging Results:    No results found.   Assessment & Plan:    Active Problems:   Anemia    Anemia Transfuse with 2 units prbc, lasix 80m iv between units.  Check cbc after transfusion Cont ferrous sulfate  Hypokalemia Replete  Check cmp in am  Esophageal varices Cont propranolol protonix 421miv bid  CHF (EF 65%) Cont prpranolol 2080mo qday Lasix 52m88m bid  (DC Demadex 60mg13mqday, dc zaroxolyn) Kcl 20 meq po bid Strict I and o, check daily weight  Check cmp in am  Hyperlipidemia Cont crestor  Dm2 fsbs q4h  Insulin pump  Anxiety Cont paxil  Hypothyroidism Check tsh Cont levothyroxine 25 microgram po qday    DVT Prophylaxis  SCDs   AM Labs Ordered, also please review Full Orders  Family Communication: Admission, patients condition and plan of care including tests being ordered have been discussed with the patient  who indicate understanding and agree with the  plan and Code Status.  Code Status FULL CODE  Likely DC to  home  Condition GUARDED    Consults called: gastroenterology  Admission status: inpatient  Time spent in minutes : 45   JamesJani Gravelon 07/04/2017 at 5:19 PM  Between 7am to 7pm - Pager - [phone] 7pm go to [email or web address] -  password [facility]  [facility] - Office  [phone]

## 2017-07-04 NOTE — Patient Instructions (Signed)
STOP Lasix.  START Torsemide 60 mg (3 tabs) once daily. Take your first dose tonight.  Metolazone 2.5 mg once daily AS NEEDED. Call CHF clinic before taking.  Routine lab work today. Will notify you of abnormal results, otherwise no news is good news!  You have been scheduled for a heart catheterization. Please see instruction sheet for additional details.  Follow up 2-3 weeks with Dr. Aundra Dubin. See next sheet for appointment details.  Take all medication as prescribed the day of your appointment. Bring all medications with you to your appointment.  Do the following things EVERYDAY: 1) Weigh yourself in the morning before breakfast. Write it down and keep it in a log. 2) Take your medicines as prescribed 3) Eat low salt foods-Limit salt (sodium) to 2000 mg per day.  4) Stay as active as you can everyday 5) Limit all fluids for the day to less than 2 liters

## 2017-07-04 NOTE — Telephone Encounter (Signed)
Advanced Heart Failure Triage Encounter  Patient Name: Maria [name]  Date of Call: 07/04/17  Problem: Critical Hbg- 5.8  Lab called with critical hbg of 5.8.  Spoke with Barrington Ellison, PA and he advises patient to go to the ER form a recheck and possible blood transfusion.    Plan:  I called patient back but had to leave message asking for her to call me back as soon as possible.   Darron Doom, RN

## 2017-07-04 NOTE — Telephone Encounter (Signed)
Received a call from Dr. Laural Golden regarding this patient  61 y/o female with a history of NASH cirrhosis with small varices that have been banded in the past, chronic anemia with baseline hemoglobin of around 8, thrombocytopenia and chronic diastolic CHF, was seen in advanced heart failure clinic today. She had been complaining of increased fatigue and shortness of breath. Hgb was checked and found to be low at 5.8. She has not reported any gross bleeding. She is not on any anticoagulation. Vitals noted to be stable with heart rate of 77 and BP 134/68. Direct admission has been requested for transfusion of prbc as well as possible EGD +/- capsule study. She will likely need IV lasix after transfusion since heart failure note indicates that she is volume overloaded. Observation/telemetry bed requested.

## 2017-07-05 ENCOUNTER — Encounter (HOSPITAL_COMMUNITY): Payer: Self-pay | Admitting: Anesthesiology

## 2017-07-05 ENCOUNTER — Encounter (HOSPITAL_COMMUNITY)
Admission: AD | Disposition: A | Discharge status: Home / Self Care | Payer: Self-pay | Source: Ambulatory Visit | Attending: Internal Medicine

## 2017-07-05 DIAGNOSIS — K31811 Angiodysplasia of stomach and duodenum with bleeding: Secondary | ICD-10-CM

## 2017-07-05 DIAGNOSIS — I8500 Esophageal varices without bleeding: Secondary | ICD-10-CM

## 2017-07-05 HISTORY — PX: GIVENS CAPSULE STUDY: SHX5432

## 2017-07-05 LAB — GLUCOSE, CAPILLARY
Glucose-Capillary: 113 mg/dL — ABNORMAL HIGH (ref 65–99)
Glucose-Capillary: 113 mg/dL — ABNORMAL HIGH (ref 65–99)
Glucose-Capillary: 122 mg/dL — ABNORMAL HIGH (ref 65–99)
Glucose-Capillary: 123 mg/dL — ABNORMAL HIGH (ref 65–99)
Glucose-Capillary: 149 mg/dL — ABNORMAL HIGH (ref 65–99)
Glucose-Capillary: 263 mg/dL — ABNORMAL HIGH (ref 65–99)

## 2017-07-05 LAB — CBC
HCT: 25.9 % — ABNORMAL LOW (ref 36.0–46.0)
Hemoglobin: 7.7 g/dL — ABNORMAL LOW (ref 12.0–15.0)
MCH: 24.6 pg — ABNORMAL LOW (ref 26.0–34.0)
MCHC: 29.7 g/dL — ABNORMAL LOW (ref 30.0–36.0)
MCV: 82.7 fL (ref 78.0–100.0)
Platelets: DECREASED 10*3/uL (ref 150–400)
RBC: 3.13 MIL/uL — ABNORMAL LOW (ref 3.87–5.11)
RDW: 17.6 % — ABNORMAL HIGH (ref 11.5–15.5)
WBC: 4.5 10*3/uL (ref 4.0–10.5)

## 2017-07-05 LAB — OCCULT BLOOD X 1 CARD TO LAB, STOOL: Fecal Occult Bld: POSITIVE — AB

## 2017-07-05 LAB — HEMOGLOBIN AND HEMATOCRIT, BLOOD
HCT: 25.9 % — ABNORMAL LOW (ref 36.0–46.0)
Hemoglobin: 8 g/dL — ABNORMAL LOW (ref 12.0–15.0)

## 2017-07-05 LAB — COMPREHENSIVE METABOLIC PANEL
ALT: 22 U/L (ref 14–54)
AST: 44 U/L — ABNORMAL HIGH (ref 15–41)
Albumin: 2.9 g/dL — ABNORMAL LOW (ref 3.5–5.0)
Alkaline Phosphatase: 92 U/L (ref 38–126)
Anion gap: 7 (ref 5–15)
BUN: 12 mg/dL (ref 6–20)
CO2: 27 mmol/L (ref 22–32)
Calcium: 8.5 mg/dL — ABNORMAL LOW (ref 8.9–10.3)
Chloride: 106 mmol/L (ref 101–111)
Creatinine, Ser: 0.96 mg/dL (ref 0.44–1.00)
GFR calc Af Amer: >60 mL/min (ref >60)
GFR calc non Af Amer: >60 mL/min (ref >60)
Glucose, Bld: 121 mg/dL — ABNORMAL HIGH (ref 65–99)
Potassium: 3.2 mmol/L — ABNORMAL LOW (ref 3.5–5.1)
Sodium: 140 mmol/L (ref 135–145)
Total Bilirubin: 2.7 mg/dL — ABNORMAL HIGH (ref 0.3–1.2)
Total Protein: 5.7 g/dL — ABNORMAL LOW (ref 6.5–8.1)

## 2017-07-05 LAB — PREPARE RBC (CROSSMATCH)

## 2017-07-05 LAB — HIV ANTIBODY (ROUTINE TESTING W REFLEX): HIV Screen 4th Generation wRfx: NONREACTIVE

## 2017-07-05 SURGERY — IMAGING PROCEDURE, GI TRACT, INTRALUMINAL, VIA CAPSULE

## 2017-07-05 MED ORDER — METOLAZONE 5 MG PO TABS
5.0000 mg | ORAL_TABLET | Freq: Once | ORAL | Status: AC
  Administered 2017-07-05: 5 mg via ORAL
  Filled 2017-07-05: qty 1

## 2017-07-05 MED ORDER — POTASSIUM CHLORIDE CRYS ER 20 MEQ PO TBCR
40.0000 meq | EXTENDED_RELEASE_TABLET | ORAL | Status: AC
  Administered 2017-07-05 (×2): 40 meq via ORAL
  Filled 2017-07-05 (×3): qty 2

## 2017-07-05 MED ORDER — SODIUM CHLORIDE 0.9 % IV SOLN
Freq: Once | INTRAVENOUS | Status: AC
  Administered 2017-07-05: 16:00:00 via INTRAVENOUS

## 2017-07-05 MED ORDER — FUROSEMIDE 10 MG/ML IJ SOLN
80.0000 mg | Freq: Two times a day (BID) | INTRAMUSCULAR | Status: DC
  Administered 2017-07-05 – 2017-07-08 (×6): 80 mg via INTRAVENOUS
  Filled 2017-07-05 (×7): qty 8

## 2017-07-05 NOTE — Progress Notes (Signed)
PROGRESS NOTE    Maria [name]  WVP:[id number] DOB: 09/22/1955 DOA: 07/04/2017 PCP: Glenda Chroman, MD    Brief Narrative:  61 year old female admitted with symptomatic anemia and hemoglobin of 5.8. Anemia felt to be related to GI blood loss. She is being transfused PRBCs and undergoing further GI workup. She also has volume overload, thought to be related to right-sided heart failure. She is on IV diuresis.   Assessment & Plan:   Active Problems:   Cirrhosis, nonalcoholic (HCC)   Esophageal varices (HCC)   Chronic diastolic heart failure (HCC)   Anemia   Hypokalemia   1. Symptomatic anemia. Suspect this is related to GI blood loss. She has been transfused 2 units of PRBCs since admission. Hemoglobin has improved to 7.7. She is still somewhat symptomatic. We'll give 1 more unit of PRBC. 2. GI bleeding. She does have a history of esophageal varices. Seen by gastroenterology and patient is undergoing small bowel capsule study today. 3. Hypokalemia. Replace 4. Nonalcoholic cirrhosis. Does not appear to have any ascites at this time. Is still volume overloaded. Continue with diuresis. 5. Acute on chronic right-sided heart failure. Appears to have volume overload. Is followed by cardiology. Will increase Lasix from 40 mg to 80 mg daily. Will also give 1 dose of metolazone. Monitor urine output. 6. Diabetes. She is on an insulin pump. Monitor blood sugars. 7. Hypothyroidism. Continue on Synthroid 8. Hyperlipidemia. Continue Crestor   DVT prophylaxis: SCDs Code Status: full code Family Communication: discussed with husband Disposition Plan: discharge home once improved   Consultants:   gastroenterology  Procedures:     Antimicrobials:      Subjective: Feeling a little better after blood transfusions. Still feels weak and tired. Feels the lower extremities are edematous and tight  Objective: Vitals:   07/05/17 0714 07/05/17 1444 07/05/17 1706 07/05/17 1735  BP:   (!) 125/40 (!) 115/42 (!) 107/52  Pulse:  71 70 70  Resp:  18 16 16   Temp:  97.9 F (36.6 C) 98.5 F (36.9 C) 98.3 F (36.8 C)  TempSrc:  Oral Oral Oral  SpO2: 91% 97% 97% 98%  Weight:      Height:        Intake/Output Summary (Last 24 hours) at 07/05/17 1859 Last data filed at 07/05/17 1832  Gross per 24 hour  Intake           977.17 ml  Output             1900 ml  Net          -922.83 ml   Filed Weights   07/04/17 1753 07/05/17 0500  Weight: 117.4 kg (258 lb 13.1 oz) 116.7 kg (257 lb 3.2 oz)    Examination:  General exam: Appears calm and comfortable  Respiratory system: Clear to auscultation. Respiratory effort normal. Cardiovascular system: S1 & S2 heard, RRR. No JVD, murmurs, rubs, gallops or clicks. 1-2+ pedal edema. Gastrointestinal system: Abdomen is nondistended, soft and nontender. No organomegaly or masses felt. Normal bowel sounds heard. Central nervous system: Alert and oriented. No focal neurological deficits. Extremities: Symmetric 5 x 5 power. Skin: No rashes, lesions or ulcers Psychiatry: Judgement and insight appear normal. Mood & affect appropriate.     Data Reviewed: I have personally reviewed following labs and imaging studies  CBC:  Recent Labs Lab 07/04/17 1156 07/04/17 1748 07/05/17 0729  WBC 5.2  --  4.5  HGB 5.8* 5.3* 7.7*  HCT 20.3* 18.6* 25.9*  MCV  79.3  --  82.7  PLT PLATELET CLUMPS NOTED ON SMEAR, UNABLE TO ESTIMATE  --  PLATELET CLUMPS NOTED ON SMEAR, COUNT APPEARS DECREASED   Basic Metabolic Panel:  Recent Labs Lab 07/04/17 1156 07/04/17 1748 07/05/17 0729  NA 138 140 140  K 3.2* 2.7* 3.2*  CL 108 108 106  CO2 23 23 27   GLUCOSE 87 61* 121*  BUN 10 12 12   CREATININE 0.95 0.89 0.96  CALCIUM 8.1* 8.2* 8.5*   GFR: Estimated Creatinine Clearance: 74.9 mL/min (by C-G formula based on SCr of 0.96 mg/dL). Liver Function Tests:  Recent Labs Lab 07/05/17 0729  AST 44*  ALT 22  ALKPHOS 92  BILITOT 2.7*  PROT 5.7*    ALBUMIN 2.9*   No results for input(s): LIPASE, AMYLASE in the last 168 hours. No results for input(s): AMMONIA in the last 168 hours. Coagulation Profile:  Recent Labs Lab 07/04/17 1156  INR 1.60   Cardiac Enzymes: No results for input(s): CKTOTAL, CKMB, CKMBINDEX, TROPONINI in the last 168 hours. BNP (last 3 results) No results for input(s): PROBNP in the last 8760 hours. HbA1C: No results for input(s): HGBA1C in the last 72 hours. CBG:  Recent Labs Lab 07/05/17 0021 07/05/17 0356 07/05/17 0821 07/05/17 1117 07/05/17 1556  GLUCAP 123* 113* 113* 122* 149*   Lipid Profile: No results for input(s): CHOL, HDL, LDLCALC, TRIG, CHOLHDL, LDLDIRECT in the last 72 hours. Thyroid Function Tests: No results for input(s): TSH, T4TOTAL, FREET4, T3FREE, THYROIDAB in the last 72 hours. Anemia Panel: No results for input(s): VITAMINB12, FOLATE, FERRITIN, TIBC, IRON, RETICCTPCT in the last 72 hours. Sepsis Labs: No results for input(s): PROCALCITON, LATICACIDVEN in the last 168 hours.  No results found for this or any previous visit (from the past 240 hour(s)).       Radiology Studies: No results found.      Scheduled Meds: . fluticasone furoate-vilanterol  1 puff Inhalation Daily  . furosemide  80 mg Intravenous Q12H  . insulin pump   Subcutaneous Q4H  . levothyroxine  25 mcg Oral QAC breakfast  . pantoprazole (PROTONIX) IV  40 mg Intravenous Q12H  . PARoxetine  40 mg Oral BH-q7a  . potassium chloride SA  20 mEq Oral BID  . rosuvastatin  5 mg Oral QODAY   Continuous Infusions: . sodium chloride       LOS: 1 day    Time spent: 42mns    Maria Buege, MD [facility] Pager [phone] If 7PM-7AM, please contact night-coverage [email or web address] Password [email or web address] 07/05/2017, 6:59 PM

## 2017-07-05 NOTE — Progress Notes (Addendum)
Inpatient Diabetes Program Recommendations  AACE/ADA: New Consensus Statement on Inpatient Glycemic Control (2015)  Target Ranges:  Prepandial:   less than 140 mg/dL      Peak postprandial:   less than 180 mg/dL (1-2 hours)      Critically ill patients:  140 - 180 mg/dL   Results for Maria [name], Maria [name] (MRN [id number]) as of 07/05/2017 10:29  Ref. Range 07/04/2017 17:58 07/04/2017 19:47 07/05/2017 00:21 07/05/2017 03:56 07/05/2017 08:21  Glucose-Capillary Latest Ref Range: 65 - 99 mg/dL 64 (L) 71 123 (H) 113 (H) 113 (H)    Admit with: Anemia/ Low Hemoglobin  History: DM  Home DM Meds: Insulin Pump  Current Insulin Orders: Insulin Pump Q4 hours      Spoke with RN caring for pt this AM by phone (DM Coordinator not physically present on [facility] today).  Per RN, patient A&O and able to independently manage insulin pump.  NPO this AM for Small bowel Capsule study today.  CBGs have been stable since Midnight.  Reviewed charting/ documentation needs for Insulin Pumps with RN.  Called and spoke with pt by phone as well.  Patient stated she did not feel like reviewing her insulin pump settings with me right now b/c she was feeling tired.  Explained to pt that I will attempt to call her back later today to review her pump settings.  Patient told me she turned her insulin pump off last night around 9pm b/c her CBG was low.  Has been in contact with her Endocrinologist (Dr. Chalmers Cater with [facility]) and has made some downward adjustments on her pump basal settings b/c of Hypoglycemic events at home.  Have placed a call in to [facility] today.  Awaiting call back to review insulin pump setting changes with staff member from this Endocrinology office.    Addendum 1pm: Received call back form pt's Endocrinology MD's office.  Staff member from ENDO office reviewed current Insulin Pump settings with me- See below:  --Insulin Pump Settings--  Basal Rates: 12am-  3.50 units/hr 3am- 3.60 units/hr 8:30am- 5.0 units/hr 2:30pm- 5.0 units/hr 7pm- 4.60 units/hr  Total Basal Insulin per 24 hours period= 105.8 units  Carbohydrate Ratio: 1 unit for every 8 Grams of Carbohydrates  Correction/Sensitivity Factor: 1 unit for every 50 mg/dl above Target CBG  Target CBG: 120 mg/dl      --Will follow patient during hospitalization--  Wyn Quaker RN, MSN, CDE Diabetes Coordinator Inpatient Glycemic Control Team Team Pager: [phone] (8a-5p)

## 2017-07-05 NOTE — Op Note (Signed)
Small Bowel Givens Capsule Study Procedure date:  07/05/2017  Referring Provider:  Kathie Dike, MD PCP:  Dr. Glenda Chroman, MD  Indication for procedure:   Patient is 60 year old Caucasian female who has cirrhosis secondary to NASH complicated by esophageal varices which have been banded on multiple locations. She has history of iron deficiency anemia. She now presents with hemoglobin of 5.2 g. She had EGD less than 8 weeks ago revealed small esophageal varicesandmild GAVE. She had colonoscopy in July last year. She may be losing blood from small bowel and therefore undergoing the study.   Findings:  Patient was able to swallow given capsule without any difficulty. Study duration is 6 hours 31 minutes and 47 seconds.  gastric mucosa shows changes of portal hypertensive gastropathy. Antral mucosa reveals gastric antral vascular ectasia with active bleeding. Blood also noted in duodenum but originating from antrum. Mucosa of small bowel shows edema and thickened villi. Coffee-ground material noted in distal small bowel and colon.  First Gastric image: 54 sec First Duodenal image: 6 min and 59 sec First Ileo-Cecal Valve image: 3 hrs 50 min and 41 sec First Cecal image: 3 hrs and 51 min and 51 sec Gastric Passage time:    6 min Small Bowel Passage time:  3 hrs and 45 min  Summary & Recommendations:  Portal hypertensive gastropathy and enteropathy. GAVE with stigmata of active bleeding. Source of blood in the duodenum and coffee-ground material in distal small bowel and proximal colon appears to be originating from the stomach.  DC aspirin. EGD with APC ablation in a.m.

## 2017-07-05 NOTE — Progress Notes (Addendum)
Page Dr. Laural Golden with H&H results post 3rd unit. His pager # is [phone]. Even page if after hours per Dr. Laural Golden.

## 2017-07-05 NOTE — Progress Notes (Signed)
  Findings of small bowel given capsule study reviewed the patient earlier today. Schedule upper abdominal ultrasound in a.m. EGD with APC ablation of GAVE in a.m.

## 2017-07-06 ENCOUNTER — Encounter (HOSPITAL_COMMUNITY)
Admission: AD | Disposition: A | Discharge status: Home / Self Care | Payer: Self-pay | Source: Ambulatory Visit | Attending: Internal Medicine

## 2017-07-06 ENCOUNTER — Inpatient Hospital Stay (HOSPITAL_COMMUNITY): Payer: PPO

## 2017-07-06 ENCOUNTER — Telehealth (HOSPITAL_COMMUNITY): Payer: Self-pay | Admitting: Cardiology

## 2017-07-06 ENCOUNTER — Inpatient Hospital Stay (HOSPITAL_COMMUNITY): Payer: PPO | Admitting: Anesthesiology

## 2017-07-06 ENCOUNTER — Encounter (HOSPITAL_COMMUNITY): Payer: Self-pay | Admitting: Internal Medicine

## 2017-07-06 DIAGNOSIS — K31819 Angiodysplasia of stomach and duodenum without bleeding: Secondary | ICD-10-CM | POA: Diagnosis present

## 2017-07-06 DIAGNOSIS — I272 Pulmonary hypertension, unspecified: Secondary | ICD-10-CM | POA: Diagnosis present

## 2017-07-06 DIAGNOSIS — K3189 Other diseases of stomach and duodenum: Secondary | ICD-10-CM

## 2017-07-06 DIAGNOSIS — I50813 Acute on chronic right heart failure: Secondary | ICD-10-CM | POA: Diagnosis present

## 2017-07-06 DIAGNOSIS — Z794 Long term (current) use of insulin: Secondary | ICD-10-CM

## 2017-07-06 DIAGNOSIS — E119 Type 2 diabetes mellitus without complications: Secondary | ICD-10-CM

## 2017-07-06 DIAGNOSIS — K922 Gastrointestinal hemorrhage, unspecified: Secondary | ICD-10-CM | POA: Diagnosis present

## 2017-07-06 HISTORY — PX: ESOPHAGOGASTRODUODENOSCOPY (EGD) WITH PROPOFOL: SHX5813

## 2017-07-06 LAB — BASIC METABOLIC PANEL
Anion gap: 8 (ref 5–15)
BUN: 10 mg/dL (ref 6–20)
CO2: 27 mmol/L (ref 22–32)
Calcium: 8.7 mg/dL — ABNORMAL LOW (ref 8.9–10.3)
Chloride: 104 mmol/L (ref 101–111)
Creatinine, Ser: 0.91 mg/dL (ref 0.44–1.00)
GFR calc Af Amer: >60 mL/min (ref >60)
GFR calc non Af Amer: >60 mL/min (ref >60)
Glucose, Bld: 161 mg/dL — ABNORMAL HIGH (ref 65–99)
Potassium: 3.2 mmol/L — ABNORMAL LOW (ref 3.5–5.1)
Sodium: 139 mmol/L (ref 135–145)

## 2017-07-06 LAB — BPAM RBC
Blood Product Expiration Date: 201811052359
Blood Product Expiration Date: 201811052359
Blood Product Expiration Date: 201811152359
ISSUE DATE / TIME: 201810172211
ISSUE DATE / TIME: 201810180210
ISSUE DATE / TIME: 201810181705
Unit Type and Rh: 9500
Unit Type and Rh: 9500
Unit Type and Rh: 9500

## 2017-07-06 LAB — GLUCOSE, CAPILLARY
Glucose-Capillary: 152 mg/dL — ABNORMAL HIGH (ref 65–99)
Glucose-Capillary: 155 mg/dL — ABNORMAL HIGH (ref 65–99)
Glucose-Capillary: 157 mg/dL — ABNORMAL HIGH (ref 65–99)
Glucose-Capillary: 190 mg/dL — ABNORMAL HIGH (ref 65–99)
Glucose-Capillary: 191 mg/dL — ABNORMAL HIGH (ref 65–99)
Glucose-Capillary: 247 mg/dL — ABNORMAL HIGH (ref 65–99)
Glucose-Capillary: 247 mg/dL — ABNORMAL HIGH (ref 65–99)

## 2017-07-06 LAB — TYPE AND SCREEN
ABO/RH(D): O NEG
Antibody Screen: NEGATIVE
Unit division: 0
Unit division: 0
Unit division: 0

## 2017-07-06 LAB — CBC
HCT: 26.7 % — ABNORMAL LOW (ref 36.0–46.0)
Hemoglobin: 8.1 g/dL — ABNORMAL LOW (ref 12.0–15.0)
MCH: 25.3 pg — ABNORMAL LOW (ref 26.0–34.0)
MCHC: 30.3 g/dL (ref 30.0–36.0)
MCV: 83.4 fL (ref 78.0–100.0)
Platelets: 43 10*3/uL — ABNORMAL LOW (ref 150–400)
RBC: 3.2 MIL/uL — ABNORMAL LOW (ref 3.87–5.11)
RDW: 17.9 % — ABNORMAL HIGH (ref 11.5–15.5)
WBC: 4.7 10*3/uL (ref 4.0–10.5)

## 2017-07-06 LAB — PROTIME-INR
INR: 1.59
Prothrombin Time: 18.8 seconds — ABNORMAL HIGH (ref 11.4–15.2)

## 2017-07-06 SURGERY — ESOPHAGOGASTRODUODENOSCOPY (EGD) WITH PROPOFOL
Anesthesia: Monitor Anesthesia Care

## 2017-07-06 MED ORDER — SUCRALFATE 1 GM/10ML PO SUSP
1.0000 g | Freq: Three times a day (TID) | ORAL | Status: DC
  Administered 2017-07-06 – 2017-07-10 (×16): 1 g via ORAL
  Filled 2017-07-06 (×16): qty 10

## 2017-07-06 MED ORDER — MIDAZOLAM HCL 2 MG/2ML IJ SOLN
INTRAMUSCULAR | Status: AC
  Filled 2017-07-06: qty 2

## 2017-07-06 MED ORDER — LIDOCAINE VISCOUS 2 % MT SOLN
15.0000 mL | Freq: Once | OROMUCOSAL | Status: AC
  Administered 2017-07-06: 15 mL via OROMUCOSAL

## 2017-07-06 MED ORDER — SODIUM CHLORIDE 0.9 % IV SOLN: INTRAVENOUS | Status: DC

## 2017-07-06 MED ORDER — METOLAZONE 5 MG PO TABS
5.0000 mg | ORAL_TABLET | Freq: Once | ORAL | Status: AC
  Administered 2017-07-06: 5 mg via ORAL
  Filled 2017-07-06: qty 1

## 2017-07-06 MED ORDER — LACTATED RINGERS IV SOLN
INTRAVENOUS | Status: DC | PRN
  Administered 2017-07-06: 10:00:00 via INTRAVENOUS

## 2017-07-06 MED ORDER — PROPOFOL 10 MG/ML IV BOLUS
INTRAVENOUS | Status: AC
  Filled 2017-07-06: qty 20

## 2017-07-06 MED ORDER — FENTANYL CITRATE (PF) 100 MCG/2ML IJ SOLN
25.0000 ug | Freq: Once | INTRAMUSCULAR | Status: AC
  Administered 2017-07-06: 25 ug via INTRAVENOUS

## 2017-07-06 MED ORDER — LACTATED RINGERS IV SOLN
INTRAVENOUS | Status: DC
  Administered 2017-07-06: 10:00:00 via INTRAVENOUS

## 2017-07-06 MED ORDER — MIDAZOLAM HCL 2 MG/2ML IJ SOLN
1.0000 mg | INTRAMUSCULAR | Status: AC
  Administered 2017-07-06: 2 mg via INTRAVENOUS

## 2017-07-06 MED ORDER — PROPOFOL 10 MG/ML IV BOLUS
INTRAVENOUS | Status: DC | PRN
  Administered 2017-07-06 (×3): 10 mg via INTRAVENOUS

## 2017-07-06 MED ORDER — HYDROCODONE-ACETAMINOPHEN 5-325 MG PO TABS
2.0000 | ORAL_TABLET | Freq: Once | ORAL | Status: AC
  Administered 2017-07-06: 2 via ORAL
  Filled 2017-07-06: qty 2

## 2017-07-06 MED ORDER — LIDOCAINE VISCOUS 2 % MT SOLN
10.0000 mL | OROMUCOSAL | Status: DC | PRN
  Administered 2017-07-06: 10 mL via OROMUCOSAL
  Filled 2017-07-06: qty 15

## 2017-07-06 MED ORDER — LIDOCAINE VISCOUS 2 % MT SOLN
OROMUCOSAL | Status: AC
  Filled 2017-07-06: qty 15

## 2017-07-06 MED ORDER — POTASSIUM CHLORIDE CRYS ER 20 MEQ PO TBCR
40.0000 meq | EXTENDED_RELEASE_TABLET | ORAL | Status: AC
  Administered 2017-07-06 (×2): 40 meq via ORAL
  Filled 2017-07-06 (×2): qty 2

## 2017-07-06 MED ORDER — PROPOFOL 500 MG/50ML IV EMUL
INTRAVENOUS | Status: DC | PRN
  Administered 2017-07-06: 100 ug/kg/min via INTRAVENOUS

## 2017-07-06 MED ORDER — INFLUENZA VAC SPLIT QUAD 0.5 ML IM SUSY
0.5000 mL | PREFILLED_SYRINGE | INTRAMUSCULAR | Status: AC
  Administered 2017-07-07: 0.5 mL via INTRAMUSCULAR
  Filled 2017-07-06: qty 0.5

## 2017-07-06 MED ORDER — FENTANYL CITRATE (PF) 100 MCG/2ML IJ SOLN
INTRAMUSCULAR | Status: AC
  Filled 2017-07-06: qty 2

## 2017-07-06 NOTE — Transfer of Care (Signed)
Immediate Anesthesia Transfer of Care Note  Patient: Maria [name]  Procedure(s) Performed: ESOPHAGOGASTRODUODENOSCOPY (EGD) WITH PROPOFOL (N/A )  Patient Location: PACU  Anesthesia Type:MAC  Level of Consciousness: awake and alert   Airway & Oxygen Therapy: Patient Spontanous Breathing  Post-op Assessment: Report given to RN  Post vital signs: Reviewed and stable  Last Vitals:  Vitals:   07/06/17 0936 07/06/17 1000  BP: (!) 132/49 (!) 123/50  Pulse: 75   Resp: 16 16  Temp: 36.6 C   SpO2: 96%     Last Pain:  Vitals:   07/06/17 1012  TempSrc:   PainSc: 3          Complications: No apparent anesthesia complications

## 2017-07-06 NOTE — Progress Notes (Signed)
Pt is going off the unit for a abdominal and liver ultrasound then will go from to the endoscopy labs.

## 2017-07-06 NOTE — Anesthesia Preprocedure Evaluation (Signed)
Anesthesia Evaluation  Patient identified by MRN, date of birth, ID band Patient awake    Reviewed: Allergy & Precautions, H&P , NPO status , Patient's Chart, lab work & pertinent test results  History of Anesthesia Complications (+) PONV and history of anesthetic complications  Airway Mallampati: I  TM Distance: >3 FB     Dental  (+) Teeth Intact   Pulmonary    breath sounds clear to auscultation       Cardiovascular hypertension, Pt. on medications + CAD and +CHF   Rhythm:Regular Rate:Normal     Neuro/Psych PSYCHIATRIC DISORDERS Anxiety Depression    GI/Hepatic GERD  Medicated and Controlled,(+) Cirrhosis  (nonalcoholic )  Esophageal Varices    ,   Endo/Other  diabetes, Well Controlled, Type 2Hypothyroidism   Renal/GU      Musculoskeletal  (+) Fibromyalgia -  Abdominal   Peds  Hematology  (+) anemia ,   Anesthesia Other Findings   Reproductive/Obstetrics                             Anesthesia Physical Anesthesia Plan  ASA: III  Anesthesia Plan: MAC   Post-op Pain Management:    Induction: Intravenous  PONV Risk Score and Plan:   Airway Management Planned: Simple Face Mask  Additional Equipment:   Intra-op Plan:   Post-operative Plan:   Informed Consent: I have reviewed the patients History and Physical, chart, labs and discussed the procedure including the risks, benefits and alternatives for the proposed anesthesia with the patient or authorized representative who has indicated his/her understanding and acceptance.     Plan Discussed with:   Anesthesia Plan Comments:         Anesthesia Quick Evaluation

## 2017-07-06 NOTE — Anesthesia Postprocedure Evaluation (Signed)
Anesthesia Post Note  Patient: Maria [name]  Procedure(s) Performed: ESOPHAGOGASTRODUODENOSCOPY (EGD) WITH PROPOFOL (N/A )  Patient location during evaluation: PACU Anesthesia Type: MAC Level of consciousness: awake and alert and oriented Pain management: pain level controlled Vital Signs Assessment: post-procedure vital signs reviewed and stable Respiratory status: spontaneous breathing Cardiovascular status: blood pressure returned to baseline and stable Postop Assessment: no apparent nausea or vomiting Anesthetic complications: no     Last Vitals:  Vitals:   07/06/17 0936 07/06/17 1000  BP: (!) 132/49 (!) 123/50  Pulse: 75   Resp: 16 16  Temp: 36.6 C   SpO2: 96%     Last Pain:  Vitals:   07/06/17 1012  TempSrc:   PainSc: 3                  Shimeka Bacot

## 2017-07-06 NOTE — Op Note (Signed)
[facility] Patient Name: Maria [name] Procedure Date: 07/06/2017 9:45 AM MRN: [id number] Date of Birth: 1955-10-14 Attending MD: Hildred Laser , MD CSN: [id number] Age: 61 Admit Type: Inpatient Procedure:                Upper GI endoscopy Indications:              Iron deficiency anemia secondary to chronic blood                            loss; active bleeding noted from GAVE on Given                            capsule study. Patient with cirrhosis Providers:                Hildred Laser, MD, Jeanann Lewandowsky. Sharon Seller, RN, Aram Candela Referring MD:             Glenda Chroman, MD Medicines:                Lidocaine spray, Propofol per Anesthesia Complications:            No immediate complications. Estimated Blood Loss:     Estimated blood loss was minimal. Procedure:                Pre-Anesthesia Assessment:                           - Prior to the procedure, a History and Physical                            was performed, and patient medications and                            allergies were reviewed. The patient's tolerance of                            previous anesthesia was also reviewed. The risks                            and benefits of the procedure and the sedation                            options and risks were discussed with the patient.                            All questions were answered, and informed consent                            was obtained. Prior Anticoagulants: The patient has                            taken no previous anticoagulant or antiplatelet  agents. ASA Grade Assessment: III - A patient with                            severe systemic disease. After reviewing the risks                            and benefits, the patient was deemed in                            satisfactory condition to undergo the procedure.                           After obtaining informed consent, the endoscope was                        passed under direct vision. Throughout the                            procedure, the patient's blood pressure, pulse, and                            oxygen saturations were monitored continuously. The                            [id number]) scope was introduced through the                            mouth, and advanced to the second part of duodenum.                            The upper GI endoscopy was accomplished without                            difficulty. The patient tolerated the procedure                            well. Scope In: 10:18:29 AM Scope Out: 10:35:09 AM Total Procedure Duration: 0 hours 16 minutes 40 seconds  Findings:      Grade I varices were found in the middle third of the esophagus. They       were small in size.      A post variceal banding scar was found in the lower third of the       esophagus.      The Z-line was regular and was found 35 cm from the incisors.      A 2 cm hiatal hernia was present.      Moderate portal hypertensive gastropathy was found in the gastric fundus       and in the gastric body.      Severe gastric antral vascular ectasia with bleeding was present in the       gastric antrum and in the prepyloric region of the stomach. Coagulation       for hemostasis using argon plasma was successful.      Patchy moderate mucosal changes characterized by congestion and       nodularity were found in  the duodenal bulb and in the second portion of       the duodenum. Impression:               - Grade I esophageal varices.                           - Scar in the lower third of the esophagus.                           - Z-line regular, 35 cm from the incisors.                           - 2 cm hiatal hernia.                           - Portal hypertensive gastropathy.                           - Gastric antral vascular ectasia with bleeding.                            Treated with argon plasma coagulation (APC).                            - Mucosal changes in the duodenum.                           - No specimens collected. Moderate Sedation:      Per Anesthesia Care Recommendation:           - Return patient to hospital ward for ongoing care.                           - Full liquid diet today.                           - Continue present medications.                           - No aspirin, ibuprofen, naproxen, or other                            non-steroidal anti-inflammatory drugs.                           - sucralfate 1 g by mouth before meals and daily at                            bedtime.                           - CBC in a.m.                           - Repeat therapeutic EGD in 4 weeks. Procedure Code(s):        --- Professional ---  43255, Esophagogastroduodenoscopy, flexible,                            transoral; with control of bleeding, any method Diagnosis Code(s):        --- Professional ---                           I85.00, Esophageal varices without bleeding                           K22.8, Other specified diseases of esophagus                           K44.9, Diaphragmatic hernia without obstruction or                            gangrene                           K76.6, Portal hypertension                           K31.89, Other diseases of stomach and duodenum                           K31.811, Angiodysplasia of stomach and duodenum                            with bleeding                           D50.0, Iron deficiency anemia secondary to blood                            loss (chronic) CPT copyright 2016 [employer]. All rights reserved. The codes documented in this report are preliminary and upon coder review may  be revised to meet current compliance requirements. Hildred Laser, MD Hildred Laser, MD 07/06/2017 11:16:31 AM This report has been signed electronically. Number of Addenda: 0

## 2017-07-06 NOTE — Progress Notes (Signed)
Ultrasound and EGD findings reviewed with the patient her husband and 2 sisters.  No focal abnormalities noted involving liver with cirrhotic contour. Splenomegaly and scant amount of perihepatic ascites. Hepatic and portal veins are patent with normal direction of flow and no evidence of thrombus.  Patient is considering referral to [facility]. She has been seen at [facility] because that is what her insurance required. Will wait for cardiac evaluation to be completed regarding pulmonary hypertension. It remains to be seen if she has pulmonary hypertension to cardiac or lung disease or she has portal pulmonary hypertension. She is scheduled for right heart cath next week.  Recommendations:  No aspirin or NSAIDs. Change PPI to oral route in a.m. We'll transfuse another unit of PRBCs if hemoglobin remains around 8 g.   Dr. Oneida Alar will be assisting you with GI issues over the weekend.

## 2017-07-06 NOTE — Telephone Encounter (Signed)
Patient is currently admitted at [name] call from Dr. Laural Golden, please page [phone]  Patient will more than likely discharged over the weekend, no additional information given regarding patient

## 2017-07-06 NOTE — Progress Notes (Signed)
PROGRESS NOTE    Maria [name]  YHC:[id number] DOB: 1956-02-01 DOA: 07/04/2017 PCP: Glenda Chroman, MD    Brief Narrative:  61 year old female admitted with symptomatic anemia and hemoglobin of 5.8. Anemia felt to be related to GI blood loss. She is being transfused PRBCs and undergoing further GI workup. She also has volume overload, thought to be related to right-sided heart failure. She is on IV diuresis.   Assessment & Plan:   Active Problems:   Cirrhosis, nonalcoholic (HCC)   Esophageal varices (HCC)   Chronic diastolic heart failure (HCC)   Anemia   Hypokalemia   1. Symptomatic anemia. Suspect this is related to GI blood loss. She has been transfused 3 units of PRBCs since admission. Hemoglobin has improved to 8.1. Overall symptoms are better. Will continue to monitor. If hemoglobin begins to decline or she starts having symptoms, can consider further transfusions. In light of #5, would avoid giving extra volume unless very necessary. 2. GI bleeding. She does have a history of esophageal varices. EGD done today shows that she had GAVE that was treated with APC. GI following. Continue on PPI and carafate 3. Hypokalemia. Replace 4. Nonalcoholic cirrhosis. Does not appear to have any ascites at this time. Is still volume overloaded. Continue with diuresis. 5. Acute on chronic right-sided heart failure. Continues to have volume overload. Is followed by cardiology. Lasix increased is currently at  80 mg IV BID. She also received a dose of metolazone yesterday. Overall urine output improved yesterday. Will give another dose of metoalzone today. Volume status is -3.1L since admission 6. Diabetes. She is on an insulin pump. Blood sugars have been stable. Monitor blood sugars. 7. Hypothyroidism. Continue on Synthroid 8. Hyperlipidemia. Continue Crestor   DVT prophylaxis: SCDs Code Status: full code Family Communication: discussed with husband Disposition Plan: discharge home once  improved   Consultants:   gastroenterology  Procedures:  EGD: - Grade I esophageal varices.                           - Scar in the lower third of the esophagus.                           - Z-line regular, 35 cm from the incisors.                           - 2 cm hiatal hernia.                           - Portal hypertensive gastropathy.                           - Gastric antral vascular ectasia with bleeding.                            Treated with argon plasma coagulation (APC).                           - Mucosal changes in the duodenum.                            - No specimens collected.  Antimicrobials:      Subjective:  Feeling better after transfusion. Shortness of breath is better. Generalized weakness is improving.  Objective: Vitals:   07/06/17 1051 07/06/17 1100 07/06/17 1115 07/06/17 1130  BP: (!) 137/59 (!) 130/56 (!) 118/40 (!) 114/52  Pulse: 83 82 83 77  Resp: 16 17 19 17   Temp: 98.5 F (36.9 C)     TempSrc:      SpO2: 95% 96% 96% 95%  Weight:      Height:        Intake/Output Summary (Last 24 hours) at 07/06/17 1254 Last data filed at 07/06/17 1055  Gross per 24 hour  Intake          1077.17 ml  Output             3500 ml  Net         -2422.83 ml   Filed Weights   07/04/17 1753 07/05/17 0500 07/06/17 0600  Weight: 117.4 kg (258 lb 13.1 oz) 116.7 kg (257 lb 3.2 oz) 113.7 kg (250 lb 9.6 oz)    Examination:  General exam: Appears calm and comfortable  Respiratory system: Clear to auscultation. Respiratory effort normal. Cardiovascular system: S1 & S2 heard, RRR. No JVD, murmurs, rubs, gallops or clicks. 1-2+ pedal edema. Gastrointestinal system: Abdomen is nondistended, soft and nontender. No organomegaly or masses felt. Normal bowel sounds heard. Central nervous system: Alert and oriented. No focal neurological deficits. Extremities: Symmetric 5 x 5 power. Skin: No rashes, lesions or ulcers Psychiatry: Judgement and insight appear normal.  Mood & affect appropriate.     Data Reviewed: I have personally reviewed following labs and imaging studies  CBC:  Recent Labs Lab 07/04/17 1156 07/04/17 1748 07/05/17 0729 07/05/17 2117 07/06/17 0624  WBC 5.2  --  4.5  --  4.7  HGB 5.8* 5.3* 7.7* 8.0* 8.1*  HCT 20.3* 18.6* 25.9* 25.9* 26.7*  MCV 79.3  --  82.7  --  83.4  PLT PLATELET CLUMPS NOTED ON SMEAR, UNABLE TO ESTIMATE  --  PLATELET CLUMPS NOTED ON SMEAR, COUNT APPEARS DECREASED  --  43*   Basic Metabolic Panel:  Recent Labs Lab 07/04/17 1156 07/04/17 1748 07/05/17 0729 07/06/17 0624  NA 138 140 140 139  K 3.2* 2.7* 3.2* 3.2*  CL 108 108 106 104  CO2 23 23 27 27   GLUCOSE 87 61* 121* 161*  BUN 10 12 12 10   CREATININE 0.95 0.89 0.96 0.91  CALCIUM 8.1* 8.2* 8.5* 8.7*   GFR: Estimated Creatinine Clearance: 77.8 mL/min (by C-G formula based on SCr of 0.91 mg/dL). Liver Function Tests:  Recent Labs Lab 07/05/17 0729  AST 44*  ALT 22  ALKPHOS 92  BILITOT 2.7*  PROT 5.7*  ALBUMIN 2.9*   No results for input(s): LIPASE, AMYLASE in the last 168 hours. No results for input(s): AMMONIA in the last 168 hours. Coagulation Profile:  Recent Labs Lab 07/04/17 1156 07/06/17 0624  INR 1.60 1.59   Cardiac Enzymes: No results for input(s): CKTOTAL, CKMB, CKMBINDEX, TROPONINI in the last 168 hours. BNP (last 3 results) No results for input(s): PROBNP in the last 8760 hours. HbA1C: No results for input(s): HGBA1C in the last 72 hours. CBG:  Recent Labs Lab 07/06/17 0018 07/06/17 0409 07/06/17 0807 07/06/17 0947 07/06/17 1107  GLUCAP 247* 191* 152* 155* 157*   Lipid Profile: No results for input(s): CHOL, HDL, LDLCALC, TRIG, CHOLHDL, LDLDIRECT in the last 72 hours. Thyroid Function Tests: No results for input(s): TSH, T4TOTAL, FREET4, T3FREE, THYROIDAB in the last 72  hours. Anemia Panel: No results for input(s): VITAMINB12, FOLATE, FERRITIN, TIBC, IRON, RETICCTPCT in the last 72 hours. Sepsis  Labs: No results for input(s): PROCALCITON, LATICACIDVEN in the last 168 hours.  No results found for this or any previous visit (from the past 240 hour(s)).       Radiology Studies: US Abdomen Complete  Result Date: 07/06/2017 CLINICAL DATA:  Maria [name] EXAM: ABDOMEN ULTRASOUND COMPLETE COMPARISON:  11/24/2016 FINDINGS: Gallbladder: Prior cholecystectomy Common bile duct: Diameter: Normal caliber, 4 mm Liver: Heterogeneous echotexture throughout the liver. No focal hepatic abnormality. Portal vein is patent on color Doppler imaging with normal direction of blood flow towards the liver. IVC: No abnormality visualized. Pancreas: Not visualized due to overlying bowel gas. Spleen: Splenomegaly with a craniocaudal length of 23 cm and splenic volume of 2067 mL. Right Kidney: Length: 11.8 cm. Echogenicity within normal limits. No mass or hydronephrosis visualized. Left Kidney: Length: 13.7 cm. Echogenicity within normal limits. No mass or hydronephrosis visualized. Abdominal aorta: No aneurysm visualized. Other findings: Small amount of perihepatic ascites noted. IMPRESSION: Heterogeneous echotexture throughout the liver with slightly nodular contours compatible with cirrhosis. Small amount of perihepatic ascites. Splenomegaly. Electronically Signed   By: Rolm Baptise M.D.   On: 07/06/2017 09:48   US Liver Doppler  Result Date: 07/06/2017 CLINICAL DATA:  Nonalcoholic hepatic steatosis EXAM: DUPLEX ULTRASOUND OF LIVER TECHNIQUE: Color and duplex Doppler ultrasound was performed to evaluate the hepatic in-flow and out-flow vessels. COMPARISON:  11/24/2016 FINDINGS: Portal Vein Velocities Main:  50 cm/sec Right:  19 cm/sec Left:  30 cm/sec Hepatic Vein Velocities Right:  34 cm/sec Middle:  32 cm/sec Left:  49 cm/sec Hepatic Artery Velocity:  243 cm/sec Splenic Vein Velocity:  21 cm/sec Varices: Absent Ascites: Present about the liver. Portal veins are hepato pedal in directionality. Hepatic veins are hepatofugal  inflow. There is no evidence of portal vein thrombosis. There is normal directionality in the splenic vein IMPRESSION: Hepatic and portal veins are patent with normal directionality of flow. Ascites is present about the liver. Electronically Signed   By: Marybelle Killings M.D.   On: 07/06/2017 09:50        Scheduled Meds: . fluticasone furoate-vilanterol  1 puff Inhalation Daily  . furosemide  80 mg Intravenous Q12H  . [START ON 07/07/2017] Influenza vac split quadrivalent PF  0.5 mL Intramuscular Tomorrow-1000  . insulin pump   Subcutaneous Q4H  . levothyroxine  25 mcg Oral QAC breakfast  . metolazone  5 mg Oral Once  . pantoprazole (PROTONIX) IV  40 mg Intravenous Q12H  . PARoxetine  40 mg Oral [id number]  . potassium chloride SA  20 mEq Oral BID  . potassium chloride  40 mEq Oral Q3H  . rosuvastatin  5 mg Oral QODAY  . sucralfate  1 g Oral TID WC & HS   Continuous Infusions: . sodium chloride       LOS: 2 days    Time spent: 6mns    MEMON,JEHANZEB, MD [facility] Pager [phone] If 7PM-7AM, please contact night-coverage [email or web address] Password [email or web address] 07/06/2017, 12:54 PM

## 2017-07-06 NOTE — Progress Notes (Signed)
Inpatient Diabetes Program Recommendations  AACE/ADA: New Consensus Statement on Inpatient Glycemic Control (2015)  Target Ranges:  Prepandial:   less than 140 mg/dL      Peak postprandial:   less than 180 mg/dL (1-2 hours)      Critically ill patients:  140 - 180 mg/dL   Results for Maria [name], Maria [name] (MRN [id number]) as of 07/06/2017 15:05  Ref. Range 07/05/2017 03:56 07/05/2017 08:21 07/05/2017 11:17 07/05/2017 15:56 07/05/2017 20:43 07/06/2017 00:18 07/06/2017 04:09 07/06/2017 08:07 07/06/2017 09:47 07/06/2017 11:07  Glucose-Capillary Latest Ref Range: 65 - 99 mg/dL 113 (H) 113 (H) 122 (H) 149 (H) 263 (H) 247 (H) 191 (H) 152 (H) 155 (H) 157 (H)   Review of Glycemic Control  Diabetes history: DM2 Outpatient Diabetes medications: Medtronic insulin pump with Humalog Current orders for Inpatient glycemic control: Insulin Pump ACHS & 2 am  Spoke with patient regarding diabetes and home regimen for diabetes management.  Patient uses a Medtronic insulin pump with Humalog insulin as an outpatient. Patient has insulin pump at bedside and does not have it attached at this time. Patient states that she removed her insulin pump this morning around 9am prior to going down for an ultrasound and she has not yet reconnected it. Patient states that 2-3 days prior to admission she was having issues with hypoglycemia and she adjusted her insulin pump settings as recommended by Dr. Chalmers Cater. Patient states that she prefers to continue to use her insulin pump for inpatient glycemic control. Encouraged patient to reconnect her insulin pump especially since she is getting ready to eat and to be sure to bolus for carbohydrates being consumed.  Reviewed insulin pump settings and they are as follows:    Basal insulin  12A-3A  3.50 units/hour 3A-8:30A 3.6 units/hour 8:30A-2:30P 5 units/hour 2:30P-7P 5.10 units/hour 7P-12A 4.6 units/hour Total daily basal insulin: 106.25 units/24 hours  Carb Coverage 1:8 1 unit for  every 8 grams of carbohydrates  Insulin Sensitivity 12A-7A  1:50 1 unit drops blood glucose 50 mg/dl 7A-9:30P 1:30 1 unit drops blood glucose 30 mg/dl 9:30P-12A 1:50 1 unit drops blood glucose 50 mg/dl  Target Glucose Goals 12A-7A  110-130 mg/dl 7A-9:30P 100-120 mg/dl 9:30P-12A 110-130 mg/dl  In talking with the patient he states that her blood glucose normally runs very good and her last A1C was in the 6% range. Patient states that she has a follow up appointment with Dr. Chalmers Cater in February but she plans to call and move appointment up so she can follow up with her for possible insulin pump changes. Encouraged patient to keep nursing staff informed about her insulin pump and when she is bolusing for carbohydrates or correction.  Patient verbalized understanding of information discussed and states that she does not have any further questions related to diabetes at this time.  NURSING: The patient insulin pump flow sheet will be completed by the patient at the bedside and the RN caring for the patient will use the patient's flow sheet to document in the [facility] RN will need to complete the Nursing Insulin Pump Flowsheet at least once a shift. Patient will need to keep extra insulin pump supplies at the bedside at all times.   Thanks, Barnie Alderman, RN, MSN, CDE Diabetes Coordinator Inpatient Diabetes Program [phone] (Team Pager from 8am to 5pm)

## 2017-07-06 NOTE — Progress Notes (Signed)
Patient feels much better. She says she is able to breathe better. Stool was black this morning. Hemoglobin is up to 8.1 g after 3 units of PRBCs. She has lost 7 pounds in the last 24 hours. Urine output 3.5 L in last 24 hours.  She will undergo abdominal ultrasound with Doppler followed by therapeutic EGD today.

## 2017-07-06 NOTE — Progress Notes (Signed)
Brief EGD note.  Two short columns of grade 1 esophageal varices. Moderate PHG. GAVE with stigmata of bleed; some areas covered with fresh blood. Multiple lesions ablated with APC including the ones that were bleeding. All lesions were not eradicated.

## 2017-07-07 DIAGNOSIS — I272 Pulmonary hypertension, unspecified: Secondary | ICD-10-CM

## 2017-07-07 DIAGNOSIS — K922 Gastrointestinal hemorrhage, unspecified: Secondary | ICD-10-CM

## 2017-07-07 DIAGNOSIS — D696 Thrombocytopenia, unspecified: Secondary | ICD-10-CM

## 2017-07-07 DIAGNOSIS — K7460 Unspecified cirrhosis of liver: Secondary | ICD-10-CM

## 2017-07-07 LAB — CBC
HCT: 25.7 % — ABNORMAL LOW (ref 36.0–46.0)
Hemoglobin: 7.9 g/dL — ABNORMAL LOW (ref 12.0–15.0)
MCH: 26 pg (ref 26.0–34.0)
MCHC: 30.7 g/dL (ref 30.0–36.0)
MCV: 84.5 fL (ref 78.0–100.0)
Platelets: 38 10*3/uL — ABNORMAL LOW (ref 150–400)
RBC: 3.04 MIL/uL — ABNORMAL LOW (ref 3.87–5.11)
RDW: 19.8 % — ABNORMAL HIGH (ref 11.5–15.5)
WBC: 6.6 10*3/uL (ref 4.0–10.5)

## 2017-07-07 LAB — GLUCOSE, CAPILLARY
Glucose-Capillary: 113 mg/dL — ABNORMAL HIGH (ref 65–99)
Glucose-Capillary: 128 mg/dL — ABNORMAL HIGH (ref 65–99)
Glucose-Capillary: 135 mg/dL — ABNORMAL HIGH (ref 65–99)
Glucose-Capillary: 152 mg/dL — ABNORMAL HIGH (ref 65–99)
Glucose-Capillary: 155 mg/dL — ABNORMAL HIGH (ref 65–99)
Glucose-Capillary: 157 mg/dL — ABNORMAL HIGH (ref 65–99)
Glucose-Capillary: 78 mg/dL (ref 65–99)

## 2017-07-07 LAB — AFP TUMOR MARKER: AFP, Serum, Tumor Marker: 3.1 ng/mL (ref 0.0–8.3)

## 2017-07-07 LAB — BASIC METABOLIC PANEL
Anion gap: 8 (ref 5–15)
BUN: 9 mg/dL (ref 6–20)
CO2: 30 mmol/L (ref 22–32)
Calcium: 8.7 mg/dL — ABNORMAL LOW (ref 8.9–10.3)
Chloride: 100 mmol/L — ABNORMAL LOW (ref 101–111)
Creatinine, Ser: 0.9 mg/dL (ref 0.44–1.00)
GFR calc Af Amer: >60 mL/min (ref >60)
GFR calc non Af Amer: >60 mL/min (ref >60)
Glucose, Bld: 87 mg/dL (ref 65–99)
Potassium: 2.6 mmol/L — CL (ref 3.5–5.1)
Sodium: 138 mmol/L (ref 135–145)

## 2017-07-07 LAB — MAGNESIUM: Magnesium: 1.5 mg/dL — ABNORMAL LOW (ref 1.7–2.4)

## 2017-07-07 MED ORDER — PANTOPRAZOLE SODIUM 40 MG PO TBEC
40.0000 mg | DELAYED_RELEASE_TABLET | Freq: Two times a day (BID) | ORAL | Status: DC
  Administered 2017-07-07 – 2017-07-10 (×6): 40 mg via ORAL
  Filled 2017-07-07 (×6): qty 1

## 2017-07-07 MED ORDER — MAGNESIUM SULFATE 2 GM/50ML IV SOLN
2.0000 g | INTRAVENOUS | Status: AC
  Administered 2017-07-07 – 2017-07-08 (×2): 2 g via INTRAVENOUS
  Filled 2017-07-07 (×2): qty 50

## 2017-07-07 MED ORDER — POTASSIUM CHLORIDE CRYS ER 20 MEQ PO TBCR
40.0000 meq | EXTENDED_RELEASE_TABLET | ORAL | Status: AC
  Administered 2017-07-07: 40 meq via ORAL
  Filled 2017-07-07: qty 2

## 2017-07-07 MED ORDER — MAGNESIUM SULFATE 4 GM/100ML IV SOLN
4.0000 g | Freq: Once | INTRAVENOUS | Status: DC
  Filled 2017-07-07: qty 100

## 2017-07-07 MED ORDER — POTASSIUM CHLORIDE CRYS ER 20 MEQ PO TBCR
20.0000 meq | EXTENDED_RELEASE_TABLET | Freq: Two times a day (BID) | ORAL | Status: DC
  Administered 2017-07-08: 20 meq via ORAL
  Filled 2017-07-07: qty 1

## 2017-07-07 MED ORDER — POTASSIUM CHLORIDE 10 MEQ/100ML IV SOLN
10.0000 meq | INTRAVENOUS | Status: AC
  Administered 2017-07-07 (×3): 10 meq via INTRAVENOUS
  Filled 2017-07-07 (×4): qty 100

## 2017-07-07 MED ORDER — MAGNESIUM HYDROXIDE 400 MG/5ML PO SUSP
30.0000 mL | Freq: Every day | ORAL | Status: DC | PRN
  Administered 2017-07-10: 30 mL via ORAL
  Filled 2017-07-07: qty 30

## 2017-07-07 NOTE — Progress Notes (Signed)
  Assessment/Plan: Admitted with UGIB. CLINICALLY IMPROVED. HB STABLE. NO BRBPR OR MELENA.  PLAN: 1. CHANGE PROTONIX TO PO OCT 21. 2. SUPPORTIVE CARE 3. AGREE WITH INFE PRIOR TO DISCHARGE   Subjective: Since last evaluated the patient HAS HAD NO BRBPR OR MELENA. HAD ABDOMINAL PAIN LAST NIGHT AND GIVEN VISCOUS LIDOCAINE AND PAIN PILL. THOUGHT IT WAS DUE TO GAS. HAD SEVER NAUSEA THIS AM. NO VOMITING.  Objective: Vital signs in last 24 hours: Vitals:   07/07/17 0420 07/07/17 0844  BP: (!) 114/59   Pulse: 73   Resp: 18   Temp: 98.3 F (36.8 C)   SpO2: 98% 91%   General appearance: alert, cooperative and no distress Resp: clear to auscultation bilaterally Cardio: regular rate and rhythm GI: soft, non-tender; bowel sounds normal;   Lab Results: K 2.6 Cr 0.90 Hb 7.9-8.0    Studies/Results: No results found.  Medications: I have reviewed the patient's current medications.   LOS: 5 days

## 2017-07-07 NOTE — Progress Notes (Addendum)
PROGRESS NOTE    Maria [name]  UEA:[id number] DOB: 02-06-1956 DOA: 07/04/2017 PCP: Glenda Chroman, MD    Brief Narrative:  61 year old female admitted with symptomatic anemia and hemoglobin of 5.8. Anemia felt to be related to GI blood loss. She is being transfused PRBCs and undergoing further GI workup. She also has volume overload, thought to be related to right-sided heart failure. She is on IV diuresis.   Assessment & Plan:   Active Problems:   Cirrhosis, nonalcoholic (HCC)   Diabetes mellitus ([place])   Thrombocytopenia ([place])   Esophageal varices (HCC)   Esophageal varices in cirrhosis (HCC)   Chronic diastolic heart failure (HCC)   Symptomatic anemia   Hypokalemia   Acute on chronic right-sided congestive heart failure (HCC)   Pulmonary hypertension (HCC)   GAVE (gastric antral vascular ectasia)   GI bleeding   1. Symptomatic anemia. Suspect this is related to GI blood loss. She has been transfused 3 units of PRBCs since admission. Hemoglobin has improved and is stable. Overall symptoms are better. Will continue to monitor. If hemoglobin begins to significantly decline or she starts having symptoms, can consider further transfusions. In light of #5, would avoid giving extra volume unless very necessary. 2. GI bleeding. She does have a history of esophageal varices. EGD shows that she had GAVE that was treated with APC. GI following. Continue on PPI and carafate 3. Hypokalemia. Replace 4. Nonalcoholic cirrhosis. Does not appear to have any ascites at this time. Is still volume overloaded. Continue with diuresis. 5. Acute on chronic right-sided heart failure. Continues to have volume overload. Is followed by cardiology. Lasix increased is currently at  80 mg IV BID.  She has had good urine output today.  Volume status is -7.3L since admission.  Continue IV diuresis 6. Diabetes. She is on an insulin pump. Blood sugars have been stable. Monitor blood sugars. 7. Hypothyroidism.  Continue on Synthroid 8. Hyperlipidemia. Continue Crestor 9. Thrombocytopenia.  Related to liver disease.  Continue to monitor.   DVT prophylaxis: SCDs Code Status: full code Family Communication: discussed with husband Disposition Plan: discharge home once improved   Consultants:   gastroenterology  Procedures:  EGD: - Grade I esophageal varices.                           - Scar in the lower third of the esophagus.                           - Z-line regular, 35 cm from the incisors.                           - 2 cm hiatal hernia.                           - Portal hypertensive gastropathy.                           - Gastric antral vascular ectasia with bleeding.                            Treated with argon plasma coagulation (APC).                           -  Mucosal changes in the duodenum.                            - No specimens collected.  Antimicrobials:      Subjective: Feels that overall "tightness" in her legs is improving.  No shortness of breath.  Generalized weakness is improving.  Objective: Vitals:   07/06/17 1130 07/06/17 2117 07/07/17 0420 07/07/17 0844  BP: (!) 114/52 101/75 (!) 114/59   Pulse: 77 87 73   Resp: 17 19 18    Temp:  98.1 F (36.7 C) 98.3 F (36.8 C)   TempSrc:  Oral Oral   SpO2: 95% 94% 98% 91%  Weight:   109 kg (240 lb 4.8 oz)   Height:        Intake/Output Summary (Last 24 hours) at 07/07/17 1758 Last data filed at 07/07/17 1508  Gross per 24 hour  Intake                0 ml  Output             4200 ml  Net            -4200 ml   Filed Weights   07/05/17 0500 07/06/17 0600 07/07/17 0420  Weight: 116.7 kg (257 lb 3.2 oz) 113.7 kg (250 lb 9.6 oz) 109 kg (240 lb 4.8 oz)    Examination:  General exam: Appears calm and comfortable  Respiratory system: Clear to auscultation. Respiratory effort normal. Cardiovascular system: S1 & S2 heard, RRR. No JVD, murmurs, rubs, gallops or clicks. 1-2+ pedal edema. Gastrointestinal  system: Abdomen is nondistended, soft and nontender. No organomegaly or masses felt. Normal bowel sounds heard. Central nervous system: Alert and oriented. No focal neurological deficits. Extremities: Symmetric 5 x 5 power. Skin: No rashes, lesions or ulcers Psychiatry: Judgement and insight appear normal. Mood & affect appropriate.     Data Reviewed: I have personally reviewed following labs and imaging studies  CBC:  Recent Labs Lab 07/04/17 1156 07/04/17 1748 07/05/17 0729 07/05/17 2117 07/06/17 0624 07/07/17 0553  WBC 5.2  --  4.5  --  4.7 6.6  HGB 5.8* 5.3* 7.7* 8.0* 8.1* 7.9*  HCT 20.3* 18.6* 25.9* 25.9* 26.7* 25.7*  MCV 79.3  --  82.7  --  83.4 84.5  PLT PLATELET CLUMPS NOTED ON SMEAR, UNABLE TO ESTIMATE  --  PLATELET CLUMPS NOTED ON SMEAR, COUNT APPEARS DECREASED  --  43* 38*   Basic Metabolic Panel:  Recent Labs Lab 07/04/17 1156 07/04/17 1748 07/05/17 0729 07/06/17 0624 07/07/17 0553 07/07/17 0555  NA 138 140 140 139 138  --   K 3.2* 2.7* 3.2* 3.2* 2.6*  --   CL 108 108 106 104 100*  --   CO2 23 23 27 27 30   --   GLUCOSE 87 61* 121* 161* 87  --   BUN 10 12 12 10 9   --   CREATININE 0.95 0.89 0.96 0.91 0.90  --   CALCIUM 8.1* 8.2* 8.5* 8.7* 8.7*  --   MG  --   --   --   --   --  1.5*   GFR: Estimated Creatinine Clearance: 76.7 mL/min (by C-G formula based on SCr of 0.9 mg/dL). Liver Function Tests:  Recent Labs Lab 07/05/17 0729  AST 44*  ALT 22  ALKPHOS 92  BILITOT 2.7*  PROT 5.7*  ALBUMIN 2.9*   No results for input(s): LIPASE, AMYLASE in the  last 168 hours. No results for input(s): AMMONIA in the last 168 hours. Coagulation Profile:  Recent Labs Lab 07/04/17 1156 07/06/17 0624  INR 1.60 1.59   Cardiac Enzymes: No results for input(s): CKTOTAL, CKMB, CKMBINDEX, TROPONINI in the last 168 hours. BNP (last 3 results) No results for input(s): PROBNP in the last 8760 hours. HbA1C: No results for input(s): HGBA1C in the last 72  hours. CBG:  Recent Labs Lab 07/07/17 0423 07/07/17 0816 07/07/17 1127 07/07/17 1513 07/07/17 1614  GLUCAP 113* 78 135* 155* 128*   Lipid Profile: No results for input(s): CHOL, HDL, LDLCALC, TRIG, CHOLHDL, LDLDIRECT in the last 72 hours. Thyroid Function Tests: No results for input(s): TSH, T4TOTAL, FREET4, T3FREE, THYROIDAB in the last 72 hours. Anemia Panel: No results for input(s): VITAMINB12, FOLATE, FERRITIN, TIBC, IRON, RETICCTPCT in the last 72 hours. Sepsis Labs: No results for input(s): PROCALCITON, LATICACIDVEN in the last 168 hours.  No results found for this or any previous visit (from the past 240 hour(s)).       Radiology Studies: US Abdomen Complete  Result Date: 07/06/2017 CLINICAL DATA:  [name] EXAM: ABDOMEN ULTRASOUND COMPLETE COMPARISON:  11/24/2016 FINDINGS: Gallbladder: Prior cholecystectomy Common bile duct: Diameter: Normal caliber, 4 mm Liver: Heterogeneous echotexture throughout the liver. No focal hepatic abnormality. Portal vein is patent on color Doppler imaging with normal direction of blood flow towards the liver. IVC: No abnormality visualized. Pancreas: Not visualized due to overlying bowel gas. Spleen: Splenomegaly with a craniocaudal length of 23 cm and splenic volume of 2067 mL. Right Kidney: Length: 11.8 cm. Echogenicity within normal limits. No mass or hydronephrosis visualized. Left Kidney: Length: 13.7 cm. Echogenicity within normal limits. No mass or hydronephrosis visualized. Abdominal aorta: No aneurysm visualized. Other findings: Small amount of perihepatic ascites noted. IMPRESSION: Heterogeneous echotexture throughout the liver with slightly nodular contours compatible with cirrhosis. Small amount of perihepatic ascites. Splenomegaly. Electronically Signed   By: Rolm Baptise M.D.   On: 07/06/2017 09:48   US Liver Doppler  Result Date: 07/06/2017 CLINICAL DATA:  Nonalcoholic hepatic steatosis EXAM: DUPLEX ULTRASOUND OF LIVER TECHNIQUE:  Color and duplex Doppler ultrasound was performed to evaluate the hepatic in-flow and out-flow vessels. COMPARISON:  11/24/2016 FINDINGS: Portal Vein Velocities Main:  50 cm/sec Right:  19 cm/sec Left:  30 cm/sec Hepatic Vein Velocities Right:  34 cm/sec Middle:  32 cm/sec Left:  49 cm/sec Hepatic Artery Velocity:  243 cm/sec Splenic Vein Velocity:  21 cm/sec Varices: Absent Ascites: Present about the liver. Portal veins are hepato pedal in directionality. Hepatic veins are hepatofugal inflow. There is no evidence of portal vein thrombosis. There is normal directionality in the splenic vein IMPRESSION: Hepatic and portal veins are patent with normal directionality of flow. Ascites is present about the liver. Electronically Signed   By: Marybelle Killings M.D.   On: 07/06/2017 09:50        Scheduled Meds: . fluticasone furoate-vilanterol  1 puff Inhalation Daily  . furosemide  80 mg Intravenous Q12H  . Influenza vac split quadrivalent PF  0.5 mL Intramuscular Tomorrow-1000  . insulin pump   Subcutaneous Q4H  . levothyroxine  25 mcg Oral QAC breakfast  . pantoprazole  40 mg Oral BID AC  . PARoxetine  40 mg Oral BH-q7a  . [START ON 07/08/2017] potassium chloride SA  20 mEq Oral BID  . rosuvastatin  5 mg Oral QODAY  . sucralfate  1 g Oral TID WC & HS   Continuous Infusions: . sodium chloride    .  magnesium sulfate 1 - 4 g bolus IVPB       LOS: 3 days    Time spent: 42mns    MEMON,JEHANZEB, MD [facility] Pager [phone] If 7PM-7AM, please contact night-coverage [email or web address] Password [email or web address] 07/07/2017, 5:58 PM

## 2017-07-08 ENCOUNTER — Other Ambulatory Visit: Payer: Self-pay

## 2017-07-08 DIAGNOSIS — E118 Type 2 diabetes mellitus with unspecified complications: Secondary | ICD-10-CM

## 2017-07-08 DIAGNOSIS — K31819 Angiodysplasia of stomach and duodenum without bleeding: Secondary | ICD-10-CM

## 2017-07-08 DIAGNOSIS — R079 Chest pain, unspecified: Secondary | ICD-10-CM

## 2017-07-08 LAB — BASIC METABOLIC PANEL
Anion gap: 10 (ref 5–15)
BUN: 10 mg/dL (ref 6–20)
CO2: 33 mmol/L — ABNORMAL HIGH (ref 22–32)
Calcium: 8.7 mg/dL — ABNORMAL LOW (ref 8.9–10.3)
Chloride: 96 mmol/L — ABNORMAL LOW (ref 101–111)
Creatinine, Ser: 0.99 mg/dL (ref 0.44–1.00)
GFR calc Af Amer: >60 mL/min (ref >60)
GFR calc non Af Amer: 60 mL/min — ABNORMAL LOW (ref >60)
Glucose, Bld: 102 mg/dL — ABNORMAL HIGH (ref 65–99)
Potassium: 2.3 mmol/L — CL (ref 3.5–5.1)
Sodium: 139 mmol/L (ref 135–145)

## 2017-07-08 LAB — CBC
HCT: 27.9 % — ABNORMAL LOW (ref 36.0–46.0)
Hemoglobin: 8.6 g/dL — ABNORMAL LOW (ref 12.0–15.0)
MCH: 25.9 pg — ABNORMAL LOW (ref 26.0–34.0)
MCHC: 30.8 g/dL (ref 30.0–36.0)
MCV: 84 fL (ref 78.0–100.0)
Platelets: 38 10*3/uL — ABNORMAL LOW (ref 150–400)
RBC: 3.32 MIL/uL — ABNORMAL LOW (ref 3.87–5.11)
RDW: 22.2 % — ABNORMAL HIGH (ref 11.5–15.5)
WBC: 4.8 10*3/uL (ref 4.0–10.5)

## 2017-07-08 LAB — GLUCOSE, CAPILLARY
Glucose-Capillary: 106 mg/dL — ABNORMAL HIGH (ref 65–99)
Glucose-Capillary: 120 mg/dL — ABNORMAL HIGH (ref 65–99)
Glucose-Capillary: 148 mg/dL — ABNORMAL HIGH (ref 65–99)
Glucose-Capillary: 132 mg/dL — ABNORMAL HIGH (ref 65–99)
Glucose-Capillary: 206 mg/dL — ABNORMAL HIGH (ref 65–99)

## 2017-07-08 LAB — MAGNESIUM: Magnesium: 1.8 mg/dL (ref 1.7–2.4)

## 2017-07-08 MED ORDER — SODIUM CHLORIDE 0.9 % IV SOLN
510.0000 mg | Freq: Once | INTRAVENOUS | Status: AC
  Administered 2017-07-08: 510 mg via INTRAVENOUS
  Filled 2017-07-08: qty 17

## 2017-07-08 MED ORDER — GI COCKTAIL ~~LOC~~
30.0000 mL | Freq: Once | ORAL | Status: AC
  Administered 2017-07-08: 30 mL via ORAL
  Filled 2017-07-08: qty 30

## 2017-07-08 MED ORDER — FUROSEMIDE 10 MG/ML IJ SOLN
60.0000 mg | Freq: Two times a day (BID) | INTRAMUSCULAR | Status: DC
  Administered 2017-07-08 – 2017-07-09 (×2): 60 mg via INTRAVENOUS
  Filled 2017-07-08 (×2): qty 6

## 2017-07-08 MED ORDER — POTASSIUM CHLORIDE CRYS ER 20 MEQ PO TBCR
40.0000 meq | EXTENDED_RELEASE_TABLET | ORAL | Status: AC
  Administered 2017-07-08 (×3): 40 meq via ORAL
  Filled 2017-07-08 (×3): qty 2

## 2017-07-08 MED ORDER — POTASSIUM CHLORIDE 10 MEQ/100ML IV SOLN
10.0000 meq | INTRAVENOUS | Status: AC
  Administered 2017-07-08 (×4): 10 meq via INTRAVENOUS
  Filled 2017-07-08 (×4): qty 100

## 2017-07-08 MED ORDER — POTASSIUM CHLORIDE 10 MEQ/100ML IV SOLN
10.0000 meq | INTRAVENOUS | Status: AC
  Administered 2017-07-08 (×3): 10 meq via INTRAVENOUS
  Filled 2017-07-08 (×3): qty 100

## 2017-07-08 MED ORDER — SODIUM CHLORIDE 0.9 % IV SOLN
510.0000 mg | Freq: Once | INTRAVENOUS | Status: DC
  Filled 2017-07-08: qty 17

## 2017-07-08 MED ORDER — SPIRONOLACTONE 25 MG PO TABS
100.0000 mg | ORAL_TABLET | Freq: Two times a day (BID) | ORAL | Status: DC
  Administered 2017-07-08 – 2017-07-09 (×3): 100 mg via ORAL
  Filled 2017-07-08 (×3): qty 4

## 2017-07-08 MED ORDER — POTASSIUM CHLORIDE CRYS ER 20 MEQ PO TBCR
20.0000 meq | EXTENDED_RELEASE_TABLET | Freq: Two times a day (BID) | ORAL | Status: DC
  Administered 2017-07-09 – 2017-07-10 (×3): 20 meq via ORAL
  Filled 2017-07-08 (×3): qty 1

## 2017-07-08 NOTE — Progress Notes (Signed)
Began patient's iron infusion at approximately 2228, at approximately 2230 patient began to complain of severe back and chest pain.  Iron infusion slowed to less than half normal run rate pain eased off some. Vitals taken and charted in record.

## 2017-07-08 NOTE — Progress Notes (Signed)
Per Dr. Darrick Meigs patient is not to receive any more FERAHEME infusions. This is to be listed is a reaction to this product.

## 2017-07-08 NOTE — Progress Notes (Addendum)
Patient ID: Maria [name], female   DOB: [birth year], 61 y.o.   MRN: [id number]   Assessment/Plan: Admitted with UGIB DUE TO GAVE. CLINICALLY IMPROVED. Hb STABLE. K STILL LOW. NO BRBPR OR MELENA.  PLAN: 1. IVFE TODAY. DISCUSSED WITH NURSING FOLLOW PROTOCOL FOR INFUSION. 2. SUPPORTIVE CARE 3. BID PPI 4. ADD ALDACTONE 100 MG BID. 5.  P AWARE IF SHE IS A HARD STICK HER OPTIONS FOR IV ACCESS ARE PICC OR PORT.   GREATER THAN 50% WAS SPENT IN COUNSELING & COORDINATION OF CARE WITH THE PATIENT: DISCUSSED  BENEFITS, RISKS, AND MANAGEMENT OF GI BLEED/IVFE/CIRRHOSIS-LOW K. TOTAL ENCOUNTER TIME: 25 MINS.  Subjective: Since I last evaluated the patient PT DOING WELL. NO BMs. NAUSEA IMPROVED. CONCERNED ABOUT IVFE DUE TO SEVERE BACK PAIN AND FELT NURSE IN [facility] DID NOT RESPOND APPROPRIATELY TO HER COMPLAINT. DOESN'T WANT THAT TO HAPPEN AGAIN. CONTINUES WITH PROBLEMS WITH LOW K DUE TO LASIX 80 MG IV BID. CONCERNED KCL RUNS WILL HURT HER VEINS AND SHE IS ALREADY A HARD STICK.  Objective: Vital signs in last 24 hours: Vitals:   07/07/17 2018 07/08/17 0454  BP: (!) 116/54 (!) 125/42  Pulse: 82 88  Resp: 20   Temp: 98.5 F (36.9 C) 98.5 F (36.9 C)  SpO2: 93% 94%   General appearance: alert, cooperative and no distress Resp: clear to auscultation bilaterally Cardio: regular rate and rhythm GI: soft, non-tender; bowel sounds normal;   Lab Results: K 2.3 Cr 0.89 Hb 8.6 PLT CT 38    Studies/Results: No results found.  Medications: I have reviewed the patient's current medications.

## 2017-07-08 NOTE — Progress Notes (Signed)
Called by nurse practitioner from [facility], to evaluate patient for chest pain.  Patient was receiving IV Feraheme when she started having mid upper back pain with radiation to anterior chest wall.  Patient says that she had a similar episode of pain when she received Feraheme last Monday. Patient blood pressure was 105/24.  Chest pain has resolved at this time. EKG obtained sinus rhythm with first-degree AV block, unchanged from previous EKG from 04/16/2017. Troponin every 6 hours x3 have been ordered. Will follow troponin.  Since patient had similar episode of chest pain with Feraheme and her blood pressure dropped. This is listed as a serious adverse reaction to Feraheme. Will list this medication as allergy.

## 2017-07-08 NOTE — Progress Notes (Signed)
PROGRESS NOTE    Maria [name]  ZJQ:[id number] DOB: 13-Sep-1956 DOA: 07/04/2017 PCP: Glenda Chroman, MD    Brief Narrative:  61 year old female admitted with symptomatic anemia and hemoglobin of 5.8. Anemia felt to be related to GI blood loss. She is being transfused PRBCs and undergoing further GI workup. She also has volume overload, thought to be related to right-sided heart failure. She is on IV diuresis.   Assessment & Plan:   Active Problems:   Cirrhosis, nonalcoholic (HCC)   Diabetes mellitus ([place])   Thrombocytopenia ([place])   Esophageal varices (HCC)   Esophageal varices in cirrhosis (HCC)   Chronic diastolic heart failure (HCC)   Symptomatic anemia   Hypokalemia   Acute on chronic right-sided congestive heart failure (HCC)   Pulmonary hypertension (HCC)   GAVE (gastric antral vascular ectasia)   GI bleeding   1. Symptomatic anemia. Suspect this is related to GI blood loss. She has been transfused 3 units of PRBCs since admission. Hemoglobin has improved and is stable. Overall symptoms are better. Will continue to monitor. If hemoglobin begins to significantly decline or she starts having symptoms, can consider further transfusions. In light of #5, would avoid giving extra volume unless very necessary. 2. GI bleeding. She does have a history of esophageal varices. EGD shows that she had GAVE that was treated with APC. GI following. Continue on PPI and carafate 3. Hypokalemia. Replace. Started on aldactone 4. Nonalcoholic cirrhosis. Does not appear to have any ascites at this time. Is still volume overloaded. Continue with diuresis. 5. Acute on chronic right-sided heart failure. Continues to have volume overload. Is followed by cardiology. Lasix is currently at  80 mg IV BID and urine output has been excellent. She had a total of 6.6L of urine out over last 24 hours. Will decrease lasix to 48m BID so as to avoid development of renal failure.  Agree with starting on aldactone.  Net volume status is -11.6L since admission.  Continue IV diuresis 6. Diabetes. She is on an insulin pump. Blood sugars have been stable. Monitor blood sugars. 7. Hypothyroidism. Continue on Synthroid 8. Hyperlipidemia. Continue Crestor 9. Thrombocytopenia.  Related to liver disease.  Continue to monitor.   DVT prophylaxis: SCDs Code Status: full code Family Communication: discussed with husband Disposition Plan: discharge home once improved   Consultants:   gastroenterology  Procedures:  EGD: - Grade I esophageal varices.                           - Scar in the lower third of the esophagus.                           - Z-line regular, 35 cm from the incisors.                           - 2 cm hiatal hernia.                           - Portal hypertensive gastropathy.                           - Gastric antral vascular ectasia with bleeding.  Treated with argon plasma coagulation (APC).                           - Mucosal changes in the duodenum.                            - No specimens collected.  Antimicrobials:      Subjective: No nausea or vomiting. Leg edema feels better. No chest pain  Objective: Vitals:   07/07/17 2018 07/08/17 0454 07/08/17 0500 07/08/17 0954  BP: (!) 116/54 (!) 125/42    Pulse: 82 88    Resp: 20     Temp: 98.5 F (36.9 C) 98.5 F (36.9 C)    TempSrc: Oral Oral    SpO2: 93% 94%  (!) 85%  Weight:   109.7 kg (241 lb 14.4 oz)   Height:        Intake/Output Summary (Last 24 hours) at 07/08/17 1611 Last data filed at 07/08/17 0800  Gross per 24 hour  Intake              480 ml  Output             4700 ml  Net            -4220 ml   Filed Weights   07/06/17 0600 07/07/17 0420 07/08/17 0500  Weight: 113.7 kg (250 lb 9.6 oz) 109 kg (240 lb 4.8 oz) 109.7 kg (241 lb 14.4 oz)    Examination:  General exam: Appears calm and comfortable  Respiratory system: Clear to auscultation. Respiratory effort  normal. Cardiovascular system: S1 & S2 heard, RRR. No JVD, murmurs, rubs, gallops or clicks. 1-2+ pedal edema. Gastrointestinal system: Abdomen is nondistended, soft and nontender. No organomegaly or masses felt. Normal bowel sounds heard. Central nervous system: Alert and oriented. No focal neurological deficits. Extremities: Symmetric 5 x 5 power. Skin: No rashes, lesions or ulcers Psychiatry: Judgement and insight appear normal. Mood & affect appropriate.     Data Reviewed: I have personally reviewed following labs and imaging studies  CBC:  Recent Labs Lab 07/04/17 1156  07/05/17 0729 07/05/17 2117 07/06/17 0624 07/07/17 0553 07/08/17 0544  WBC 5.2  --  4.5  --  4.7 6.6 4.8  HGB 5.8*  < > 7.7* 8.0* 8.1* 7.9* 8.6*  HCT 20.3*  < > 25.9* 25.9* 26.7* 25.7* 27.9*  MCV 79.3  --  82.7  --  83.4 84.5 84.0  PLT PLATELET CLUMPS NOTED ON SMEAR, UNABLE TO ESTIMATE  --  PLATELET CLUMPS NOTED ON SMEAR, COUNT APPEARS DECREASED  --  43* 38* 38*  < > = values in this interval not displayed. Basic Metabolic Panel:  Recent Labs Lab 07/04/17 1748 07/05/17 0729 07/06/17 0624 07/07/17 0553 07/07/17 0555 07/08/17 0544  NA 140 140 139 138  --  139  K 2.7* 3.2* 3.2* 2.6*  --  2.3*  CL 108 106 104 100*  --  96*  CO2 23 27 27 30   --  33*  GLUCOSE 61* 121* 161* 87  --  102*  BUN 12 12 10 9   --  10  CREATININE 0.89 0.96 0.91 0.90  --  0.99  CALCIUM 8.2* 8.5* 8.7* 8.7*  --  8.7*  MG  --   --   --   --  1.5* 1.8   GFR: Estimated Creatinine Clearance: 70 mL/min (by C-G formula based on SCr  of 0.99 mg/dL). Liver Function Tests:  Recent Labs Lab 07/05/17 0729  AST 44*  ALT 22  ALKPHOS 92  BILITOT 2.7*  PROT 5.7*  ALBUMIN 2.9*   No results for input(s): LIPASE, AMYLASE in the last 168 hours. No results for input(s): AMMONIA in the last 168 hours. Coagulation Profile:  Recent Labs Lab 07/04/17 1156 07/06/17 0624  INR 1.60 1.59   Cardiac Enzymes: No results for input(s):  CKTOTAL, CKMB, CKMBINDEX, TROPONINI in the last 168 hours. BNP (last 3 results) No results for input(s): PROBNP in the last 8760 hours. HbA1C: No results for input(s): HGBA1C in the last 72 hours. CBG:  Recent Labs Lab 07/07/17 1614 07/07/17 1954 07/08/17 0457 07/08/17 0742 07/08/17 1104  GLUCAP 128* 152* 106* 120* 148*   Lipid Profile: No results for input(s): CHOL, HDL, LDLCALC, TRIG, CHOLHDL, LDLDIRECT in the last 72 hours. Thyroid Function Tests: No results for input(s): TSH, T4TOTAL, FREET4, T3FREE, THYROIDAB in the last 72 hours. Anemia Panel: No results for input(s): VITAMINB12, FOLATE, FERRITIN, TIBC, IRON, RETICCTPCT in the last 72 hours. Sepsis Labs: No results for input(s): PROCALCITON, LATICACIDVEN in the last 168 hours.  No results found for this or any previous visit (from the past 240 hour(s)).       Radiology Studies: No results found.      Scheduled Meds: . fluticasone furoate-vilanterol  1 puff Inhalation Daily  . furosemide  80 mg Intravenous Q12H  . insulin pump   Subcutaneous Q4H  . levothyroxine  25 mcg Oral QAC breakfast  . pantoprazole  40 mg Oral BID AC  . PARoxetine  40 mg Oral BH-q7a  . [START ON 07/09/2017] potassium chloride SA  20 mEq Oral BID  . potassium chloride  40 mEq Oral Q3H  . rosuvastatin  5 mg Oral QODAY  . spironolactone  100 mg Oral BID  . sucralfate  1 g Oral TID WC & HS   Continuous Infusions: . sodium chloride    . ferumoxytol    . potassium chloride 10 mEq (07/08/17 1523)     LOS: 4 days    Time spent: 41mns    Ryan Palermo, MD [facility] Pager [phone] If 7PM-7AM, please contact night-coverage [email or web address] Password [email or web address] 07/08/2017, 4:11 PM

## 2017-07-09 ENCOUNTER — Encounter (HOSPITAL_COMMUNITY)
Admission: RE | Admit: 2017-07-09 | Discharge: 2017-07-09 | Disposition: A | Discharge status: Home / Self Care | Payer: PPO | Source: Ambulatory Visit | Attending: Internal Medicine | Admitting: Internal Medicine

## 2017-07-09 DIAGNOSIS — I8510 Secondary esophageal varices without bleeding: Secondary | ICD-10-CM

## 2017-07-09 DIAGNOSIS — K7460 Unspecified cirrhosis of liver: Secondary | ICD-10-CM

## 2017-07-09 DIAGNOSIS — K766 Portal hypertension: Secondary | ICD-10-CM

## 2017-07-09 DIAGNOSIS — I8500 Esophageal varices without bleeding: Secondary | ICD-10-CM

## 2017-07-09 DIAGNOSIS — K228 Other specified diseases of esophagus: Secondary | ICD-10-CM

## 2017-07-09 LAB — GLUCOSE, CAPILLARY
Glucose-Capillary: 134 mg/dL — ABNORMAL HIGH (ref 65–99)
Glucose-Capillary: 151 mg/dL — ABNORMAL HIGH (ref 65–99)
Glucose-Capillary: 153 mg/dL — ABNORMAL HIGH (ref 65–99)
Glucose-Capillary: 166 mg/dL — ABNORMAL HIGH (ref 65–99)
Glucose-Capillary: 189 mg/dL — ABNORMAL HIGH (ref 65–99)
Glucose-Capillary: 195 mg/dL — ABNORMAL HIGH (ref 65–99)
Glucose-Capillary: 208 mg/dL — ABNORMAL HIGH (ref 65–99)

## 2017-07-09 LAB — CBC
HCT: 28.4 % — ABNORMAL LOW (ref 36.0–46.0)
Hemoglobin: 8.7 g/dL — ABNORMAL LOW (ref 12.0–15.0)
MCH: 26.5 pg (ref 26.0–34.0)
MCHC: 30.6 g/dL (ref 30.0–36.0)
MCV: 86.6 fL (ref 78.0–100.0)
Platelets: 37 10*3/uL — ABNORMAL LOW (ref 150–400)
RBC: 3.28 MIL/uL — ABNORMAL LOW (ref 3.87–5.11)
RDW: 24.2 % — ABNORMAL HIGH (ref 11.5–15.5)
WBC: 4.7 10*3/uL (ref 4.0–10.5)

## 2017-07-09 LAB — TROPONIN I
Troponin I: <0.03 ng/mL (ref <0.03)
Troponin I: <0.03 ng/mL (ref <0.03)

## 2017-07-09 LAB — BILIRUBIN, TOTAL: Total Bilirubin: 2.6 mg/dL — ABNORMAL HIGH (ref 0.3–1.2)

## 2017-07-09 LAB — BASIC METABOLIC PANEL
Anion gap: 8 (ref 5–15)
BUN: 12 mg/dL (ref 6–20)
CO2: 31 mmol/L (ref 22–32)
Calcium: 8.7 mg/dL — ABNORMAL LOW (ref 8.9–10.3)
Chloride: 102 mmol/L (ref 101–111)
Creatinine, Ser: 0.97 mg/dL (ref 0.44–1.00)
GFR calc Af Amer: >60 mL/min (ref >60)
GFR calc non Af Amer: >60 mL/min (ref >60)
Glucose, Bld: 153 mg/dL — ABNORMAL HIGH (ref 65–99)
Potassium: 3.2 mmol/L — ABNORMAL LOW (ref 3.5–5.1)
Sodium: 141 mmol/L (ref 135–145)

## 2017-07-09 MED ORDER — POTASSIUM CHLORIDE CRYS ER 20 MEQ PO TBCR
40.0000 meq | EXTENDED_RELEASE_TABLET | ORAL | Status: AC
  Administered 2017-07-09: 40 meq via ORAL
  Filled 2017-07-09: qty 2

## 2017-07-09 NOTE — Progress Notes (Signed)
PROGRESS NOTE    Maria [name]  [id number] DOB: 1956/01/28 DOA: 07/04/2017 PCP: Glenda Chroman, MD    Brief Narrative:  61 year old female admitted with symptomatic anemia and hemoglobin of 5.8. Anemia felt to be related to GI blood loss. She is being transfused PRBCs and undergoing further GI workup. She also has volume overload, thought to be related to right-sided heart failure. She is on IV diuresis.   Assessment & Plan:   Active Problems:   Cirrhosis, nonalcoholic (HCC)   Diabetes mellitus ([place])   Thrombocytopenia ([place])   Esophageal varices (HCC)   Esophageal varices in cirrhosis (HCC)   Chronic diastolic heart failure (HCC)   Symptomatic anemia   Hypokalemia   Acute on chronic right-sided congestive heart failure (HCC)   Pulmonary hypertension (HCC)   GAVE (gastric antral vascular ectasia)   GI bleeding   1. Symptomatic anemia. Suspect this is related to GI blood loss. She has been transfused 3 units of PRBCs since admission. Hemoglobin has improved and is stable. Overall symptoms are better. Will continue to monitor. If hemoglobin begins to significantly decline or she starts having symptoms, can consider further transfusions. In light of #5, would avoid giving extra volume unless very necessary. 2. GI bleeding. She does have a history of esophageal varices. EGD shows that she had GAVE that was treated with APC. GI following. Continue on PPI and carafate 3. Hypokalemia. Replace. Started on aldactone, improving 4. Nonalcoholic cirrhosis. Does not appear to have any ascites at this time.  Gastroenterology following. 5. Acute on chronic right-sided heart failure. Continues to have volume overload. Is followed by cardiology. Net volume status is -14.1L since admission and her weight is down 29 pounds since admission.  She is feeling somewhat lightheaded on standing.  We will hold off on further diuresis for now.  We will try to contact her cardiologist, Dr. Aundra Dubin tomorrow  to discuss her right heart cath scheduled for later this week. 6. Diabetes. She is on an insulin pump. Blood sugars have been stable. Monitor blood sugars. 7. Hypothyroidism. Continue on Synthroid 8. Hyperlipidemia. Continue Crestor 9. Thrombocytopenia.  Related to liver disease.  Continue to monitor.   DVT prophylaxis: SCDs Code Status: full code Family Communication: No family present Disposition Plan: discharge home once improved   Consultants:   gastroenterology  Procedures:  EGD: - Grade I esophageal varices.                           - Scar in the lower third of the esophagus.                           - Z-line regular, 35 cm from the incisors.                           - 2 cm hiatal hernia.                           - Portal hypertensive gastropathy.                           - Gastric antral vascular ectasia with bleeding.                            Treated with argon  plasma coagulation (APC).                           - Mucosal changes in the duodenum.                            - No specimens collected.  Antimicrobials:      Subjective: Feeling lightheaded and dizzy on standing.  Had a reaction to intravenous iron infusion overnight which included chest pain and back pain.  Objective: Vitals:   07/08/17 1711 07/08/17 2241 07/09/17 0505 07/09/17 0926  BP: (!) 134/39 (!) 105/24 (!) 129/40   Pulse: 90 85 94   Resp: 20 18 20    Temp:  98.2 F (36.8 C) 99 F (37.2 C)   TempSrc:  Oral    SpO2: 92% 95% 90% 95%  Weight:   104 kg (229 lb 4.8 oz)   Height:        Intake/Output Summary (Last 24 hours) at 07/09/17 1835 Last data filed at 07/09/17 1400  Gross per 24 hour  Intake              540 ml  Output             3000 ml  Net            -2460 ml   Filed Weights   07/07/17 0420 07/08/17 0500 07/09/17 0505  Weight: 109 kg (240 lb 4.8 oz) 109.7 kg (241 lb 14.4 oz) 104 kg (229 lb 4.8 oz)    Examination:  General exam: Appears calm and comfortable    Respiratory system: Clear to auscultation. Respiratory effort normal. Cardiovascular system: S1 & S2 heard, RRR. No JVD, murmurs, rubs, gallops or clicks. 1+ pedal edema. Gastrointestinal system: Abdomen is nondistended, soft and nontender. No organomegaly or masses felt. Normal bowel sounds heard. Central nervous system: Alert and oriented. No focal neurological deficits. Extremities: Symmetric 5 x 5 power. Skin: No rashes, lesions or ulcers Psychiatry: Judgement and insight appear normal. Mood & affect appropriate.     Data Reviewed: I have personally reviewed following labs and imaging studies  CBC:  Recent Labs Lab 07/05/17 0729 07/05/17 2117 07/06/17 0624 07/07/17 0553 07/08/17 0544 07/09/17 0153  WBC 4.5  --  4.7 6.6 4.8 4.7  HGB 7.7* 8.0* 8.1* 7.9* 8.6* 8.7*  HCT 25.9* 25.9* 26.7* 25.7* 27.9* 28.4*  MCV 82.7  --  83.4 84.5 84.0 86.6  PLT PLATELET CLUMPS NOTED ON SMEAR, COUNT APPEARS DECREASED  --  43* 38* 38* 37*   Basic Metabolic Panel:  Recent Labs Lab 07/05/17 0729 07/06/17 0624 07/07/17 0553 07/07/17 0555 07/08/17 0544 07/09/17 0153  NA 140 139 138  --  139 141  K 3.2* 3.2* 2.6*  --  2.3* 3.2*  CL 106 104 100*  --  96* 102  CO2 27 27 30   --  33* 31  GLUCOSE 121* 161* 87  --  102* 153*  BUN 12 10 9   --  10 12  CREATININE 0.96 0.91 0.90  --  0.99 0.97  CALCIUM 8.5* 8.7* 8.7*  --  8.7* 8.7*  MG  --   --   --  1.5* 1.8  --    GFR: Estimated Creatinine Clearance: 69.3 mL/min (by C-G formula based on SCr of 0.97 mg/dL). Liver Function Tests:  Recent Labs Lab 07/05/17 0729 07/09/17 0153  AST 44*  --   ALT  22  --   ALKPHOS 92  --   BILITOT 2.7* 2.6*  PROT 5.7*  --   ALBUMIN 2.9*  --    No results for input(s): LIPASE, AMYLASE in the last 168 hours. No results for input(s): AMMONIA in the last 168 hours. Coagulation Profile:  Recent Labs Lab 07/04/17 1156 07/06/17 0624  INR 1.60 1.59   Cardiac Enzymes:  Recent Labs Lab 07/09/17 0153  07/09/17 0641  TROPONINI <0.03 <0.03   BNP (last 3 results) No results for input(s): PROBNP in the last 8760 hours. HbA1C: No results for input(s): HGBA1C in the last 72 hours. CBG:  Recent Labs Lab 07/09/17 0108 07/09/17 0502 07/09/17 0732 07/09/17 1144 07/09/17 1617  GLUCAP 153* 151* 166* 208* 195*   Lipid Profile: No results for input(s): CHOL, HDL, LDLCALC, TRIG, CHOLHDL, LDLDIRECT in the last 72 hours. Thyroid Function Tests: No results for input(s): TSH, T4TOTAL, FREET4, T3FREE, THYROIDAB in the last 72 hours. Anemia Panel: No results for input(s): VITAMINB12, FOLATE, FERRITIN, TIBC, IRON, RETICCTPCT in the last 72 hours. Sepsis Labs: No results for input(s): PROCALCITON, LATICACIDVEN in the last 168 hours.  No results found for this or any previous visit (from the past 240 hour(s)).       Radiology Studies: No results found.      Scheduled Meds: . fluticasone furoate-vilanterol  1 puff Inhalation Daily  . insulin pump   Subcutaneous Q4H  . levothyroxine  25 mcg Oral QAC breakfast  . pantoprazole  40 mg Oral BID AC  . PARoxetine  40 mg Oral BH-q7a  . potassium chloride SA  20 mEq Oral BID  . rosuvastatin  5 mg Oral QODAY  . spironolactone  100 mg Oral BID  . sucralfate  1 g Oral TID WC & HS   Continuous Infusions: . sodium chloride       LOS: 5 days    Time spent: 31mns    MEMON,JEHANZEB, MD [facility] Pager [phone] If 7PM-7AM, please contact night-coverage [email or web address] Password [email or web address]10/22/2018, 6:35 PM

## 2017-07-09 NOTE — Progress Notes (Signed)
  Subjective:  Patient states she does not feel well this morning. She feels very weak. She felt dizzy when she walked to the bathroom. She states she has not felt well since she had a reaction to iron infusion yesterday. Before that she walked and washed herself and did not get tight. She denies shortness of breath. She says she can now bend her knees since fluid is gone. She denies shortness of breath or chest pain.   Objective: Blood pressure (!) 129/40, pulse 94, temperature 99 F (37.2 C), resp. rate 20, height 5' 3"  (1.6 m), weight 229 lb 4.8 oz (104 kg), SpO2 90 %. Patient is alert and in no acute distress. She does not have asterixis. She remains pale. Cardiac exam with regular rhythm normal S1 and S2. Grade 2/6 systolic ejection murmur best heard at left upper sternal border. Lungs are clear to auscultation. Abdomen is full but soft and nontender. No organomegaly or masses. Trace to 1+ pitting edema involving both legs.  Urine output last 24 hours is 3600 mL  Labs/studies Results:   Recent Labs  07/07/17 0553 07/08/17 0544 07/09/17 0153  WBC 6.6 4.8 4.7  HGB 7.9* 8.6* 8.7*  HCT 25.7* 27.9* 28.4*  PLT 38* 38* 37*    BMET   Recent Labs  07/07/17 0553 07/08/17 0544 07/09/17 0153  NA 138 139 141  K 2.6* 2.3* 3.2*  CL 100* 96* 102  CO2 30 33* 31  GLUCOSE 87 102* 153*  BUN 9 10 12   CREATININE 0.90 0.99 0.97  CALCIUM 8.7* 8.7* 8.7*     Assessment:  #1. Iron deficiency anemia secondary to leading from GAVE. Status post APC ablation on 07/06/2017. She has received 3 units of PRBCs. She was given Feraheme yesterday and developed chest pain. Allergy list has been updated indicating that she is allergic to [facility].  Hemoglobin has increased from 5.3 g on admission to 8.7 today.  #2. Cirrhosis secondary to NASH complicated by esophageal variceal bleed(2011 and 2013) requiring multiple banding sessions. EGD last week revealed 2 short columns of grade 1 esophageal  varices. She also has developed GAVE as above. It remains to be seen if pulmonary hypertension is due to chronic liver disease or secondary to cardiopulmonary source. She is scheduled for a right heart cath later this week. MELD last week was 15. We'll recalculate meld in a.m.  #3. Fluid overload. She has lost 29 pounds since admission. She is back to her baseline. Ready for decrease in diuretic dose.  #4. Hypokalemia. Patient is receiving by mouth KCl and she is also on spironolactone.   Recommendations:  Recheck bilirubin and INR in a.m. along with H&H and reticulocyte count. Consider decreasing diuretic dose.

## 2017-07-10 ENCOUNTER — Encounter (HOSPITAL_COMMUNITY): Payer: Self-pay | Admitting: Internal Medicine

## 2017-07-10 LAB — GLUCOSE, CAPILLARY
Glucose-Capillary: 117 mg/dL — ABNORMAL HIGH (ref 65–99)
Glucose-Capillary: 111 mg/dL — ABNORMAL HIGH (ref 65–99)
Glucose-Capillary: 175 mg/dL — ABNORMAL HIGH (ref 65–99)

## 2017-07-10 LAB — CBC
HCT: 28.5 % — ABNORMAL LOW (ref 36.0–46.0)
Hemoglobin: 8.5 g/dL — ABNORMAL LOW (ref 12.0–15.0)
MCH: 26.5 pg (ref 26.0–34.0)
MCHC: 29.8 g/dL — ABNORMAL LOW (ref 30.0–36.0)
MCV: 88.8 fL (ref 78.0–100.0)
Platelets: 29 10*3/uL — CL (ref 150–400)
RBC: 3.21 MIL/uL — ABNORMAL LOW (ref 3.87–5.11)
RDW: 25.9 % — ABNORMAL HIGH (ref 11.5–15.5)
WBC: 4.5 10*3/uL (ref 4.0–10.5)

## 2017-07-10 LAB — BASIC METABOLIC PANEL
Anion gap: 7 (ref 5–15)
BUN: 11 mg/dL (ref 6–20)
CO2: 28 mmol/L (ref 22–32)
Calcium: 8.5 mg/dL — ABNORMAL LOW (ref 8.9–10.3)
Chloride: 103 mmol/L (ref 101–111)
Creatinine, Ser: 0.95 mg/dL (ref 0.44–1.00)
GFR calc Af Amer: >60 mL/min (ref >60)
GFR calc non Af Amer: >60 mL/min (ref >60)
Glucose, Bld: 125 mg/dL — ABNORMAL HIGH (ref 65–99)
Potassium: 3.5 mmol/L (ref 3.5–5.1)
Sodium: 138 mmol/L (ref 135–145)

## 2017-07-10 LAB — ALBUMIN: Albumin: 2.4 g/dL — ABNORMAL LOW (ref 3.5–5.0)

## 2017-07-10 LAB — PROTIME-INR
INR: 1.45
Prothrombin Time: 17.5 seconds — ABNORMAL HIGH (ref 11.4–15.2)

## 2017-07-10 LAB — RETICULOCYTES
RBC.: 3.21 MIL/uL — ABNORMAL LOW (ref 3.87–5.11)
Retic Count, Absolute: 388.4 10*3/uL — ABNORMAL HIGH (ref 19.0–186.0)
Retic Ct Pct: 12.1 % — ABNORMAL HIGH (ref 0.4–3.1)

## 2017-07-10 LAB — BILIRUBIN, TOTAL: Total Bilirubin: 2.3 mg/dL — ABNORMAL HIGH (ref 0.3–1.2)

## 2017-07-10 MED ORDER — SPIRONOLACTONE 100 MG PO TABS: 100.0000 mg | ORAL_TABLET | Freq: Every day | ORAL | 11 refills | Status: DC

## 2017-07-10 MED ORDER — PROPRANOLOL HCL 20 MG PO TABS: 20.0000 mg | ORAL_TABLET | Freq: Every evening | ORAL | Status: DC | PRN

## 2017-07-10 MED ORDER — SUCRALFATE 1 GM/10ML PO SUSP: 1.0000 g | Freq: Three times a day (TID) | ORAL | 0 refills | Status: DC

## 2017-07-10 NOTE — Care Management Important Message (Signed)
Important Message  Patient Details  Name: NYELLA ECKELS MRN: [id number] Date of Birth: [birth year]   Medicare Important Message Given:  Yes    Weylyn Ricciuti, Chauncey [place], RN 07/10/2017, 10:36 AM

## 2017-07-10 NOTE — Discharge Summary (Signed)
Physician Discharge Summary  ELENORE WANNINGER MAU:[id number] DOB: [birth year] DOA: 07/04/2017  PCP: Glenda Chroman, MD  Admit date: 07/04/2017 Discharge date: 07/10/2017  Admitted From: home Disposition:  home  Recommendations for Outpatient Follow-up:  1. Follow up with PCP in 1-2 weeks 2. Please obtain BMP/CBC in one week 3. Patient will follow up with cardiology on 10/25 for right heart cath 4. Follow up with GI as an outpatient  Home Health: Equipment/Devices:  Discharge Condition: stable CODE STATUS: full code Diet recommendation: Heart Healthy / Carb Modified   Brief/Interim Summary: 61 year old female admitted with symptomatic anemia and hemoglobin of 5.8. Anemia felt to be related to GI blood loss. She was transfused PRBCs and underwent further GI workup. She also had volume overload, thought to be related to right-sided heart failure. She was treated with IV diuresis.  Discharge Diagnoses:  Active Problems:   Cirrhosis, nonalcoholic (HCC)   Diabetes mellitus ([place])   Thrombocytopenia ([place])   Esophageal varices (HCC)   Esophageal varices in cirrhosis (HCC)   Chronic diastolic heart failure (HCC)   Symptomatic anemia   Hypokalemia   Acute on chronic right-sided congestive heart failure (HCC)   Pulmonary hypertension (HCC)   GAVE (gastric antral vascular ectasia)   GI bleeding  1. Symptomatic anemia. Related to GI blood loss. She was transfused a total of 3 units of PRBCs since admission. Hemoglobin has improved and is stable. Overall symptoms are better. Patient is iron deficient and was prescribed a dose of IV iron. Unfortunately, she has an adverse reaction to this including chest pain and back pain. Infusion was discontinued. She will be continued on oral replacement therapy and also received an adequate iron load with transfusions. 2. GI bleeding. She does have a history of esophageal varices. EGD showed that she had GAVE that was treated with APC. GI following.  Continue on PPI and carafate 3. Hypokalemia. Replaced. Started on aldactone 4. Nonalcoholic cirrhosis. Does not appear to have any ascites at this time.  Gastroenterology following. Plans are to refer patient to [facility] to be considered for liver transplant 5. Acute on chronic right-sided heart failure. Patient was diuresed with IV lasix. Net volume status is -15.7L since admission and her weight is down 33 pounds since admission. Weight on discharge is 225lbs. She has been transitioned to torsemide and aldactone. Case discussed with her cardiologist, Dr. Aundra Dubin. He recommended that patient be discharged today and follow up with cardiology as scheduled on 10/25 for right heart cath. 6. Diabetes. She is on an insulin pump. Blood sugars have been stable. 7. Hypothyroidism. Continue on Synthroid 8. Hyperlipidemia. Continue Crestor 9. Thrombocytopenia.  Related to liver disease.  Continue to monitor.  Discharge Instructions  Discharge Instructions    Diet - low sodium heart healthy    Complete by:  As directed    Increase activity slowly    Complete by:  As directed      Allergies as of 07/10/2017      Reactions   Feraheme [ferumoxytol]    Patient has severe back and chest pain when receiving FERAHEME infusions.      Medication List    STOP taking these medications   furosemide 40 MG tablet Commonly known as:  LASIX     TAKE these medications   cholecalciferol 1000 units tablet Commonly known as:  VITAMIN D Take 1,000 Units by mouth every Monday, Wednesday, and Friday.   ferrous sulfate 325 (65 FE) MG tablet Take 325 mg by mouth  2 (two) times daily with a meal.   flintstones complete 60 MG chewable tablet Chew 1 tablet by mouth daily.   fluticasone furoate-vilanterol 200-25 MCG/INH Aepb Commonly known as:  BREO ELLIPTA Inhale 1 puff into the lungs daily.   insulin lispro 100 UNIT/ML injection Commonly known as:  HUMALOG Inject into the skin 3 (three) times daily  before meals. RATE AS FOLLOWS: 1200-0300 3.55 UNITS/HR, 0300-0830=3.70 UNITS/HR, 0830-1430=5.05 UNITS/HR, 1430-1900=5.10 UNITS/HR, & 1900-2359=4.65 UNITS/HR. Patient may bolus as needed with meals.   insulin pump Soln Inject into the skin daily.   levothyroxine 25 MCG tablet Commonly known as:  SYNTHROID, LEVOTHROID Take 25 mcg by mouth daily before breakfast.   metolazone 2.5 MG tablet Commonly known as:  ZAROXOLYN Take 1 tablet (2.5 mg total) by mouth as directed. Call CHF clinic [phone]) before taking.   OXYGEN Inhale 2 L into the lungs every evening. WITH SLEEP.   pantoprazole 40 MG tablet Commonly known as:  PROTONIX Take 1 tablet by mouth once daily   PARoxetine 40 MG tablet Commonly known as:  PAXIL Take 40 mg by mouth every morning.   potassium chloride SA 20 MEQ tablet Commonly known as:  K-DUR,KLOR-CON Take 20 mEq by mouth 2 (two) times daily.   propranolol 20 MG tablet Commonly known as:  INDERAL Take 1 tablet (20 mg total) by mouth at bedtime as needed (for increased BP). What changed:  when to take this  reasons to take this  additional instructions   rosuvastatin 5 MG tablet Commonly known as:  CRESTOR Take 1 tablet (5 mg total) by mouth every other day.   spironolactone 100 MG tablet Commonly known as:  ALDACTONE Take 1 tablet (100 mg total) by mouth at bedtime.   sucralfate 1 GM/10ML suspension Commonly known as:  CARAFATE Take 10 mLs (1 g total) by mouth 4 (four) times daily -  with meals and at bedtime.   torsemide 20 MG tablet Commonly known as:  DEMADEX Take 3 tablets (60 mg total) by mouth daily.       Allergies  Allergen Reactions  . Feraheme [Ferumoxytol]     Patient has severe back and chest pain when receiving FERAHEME infusions.    Consultations:  Gastroenterology, Dr. Laural Golden   Procedures/Studies: US Abdomen Complete  Result Date: 07/06/2017 CLINICAL DATA:  [name] EXAM: ABDOMEN ULTRASOUND COMPLETE COMPARISON:   11/24/2016 FINDINGS: Gallbladder: Prior cholecystectomy Common bile duct: Diameter: Normal caliber, 4 mm Liver: Heterogeneous echotexture throughout the liver. No focal hepatic abnormality. Portal vein is patent on color Doppler imaging with normal direction of blood flow towards the liver. IVC: No abnormality visualized. Pancreas: Not visualized due to overlying bowel gas. Spleen: Splenomegaly with a craniocaudal length of 23 cm and splenic volume of 2067 mL. Right Kidney: Length: 11.8 cm. Echogenicity within normal limits. No mass or hydronephrosis visualized. Left Kidney: Length: 13.7 cm. Echogenicity within normal limits. No mass or hydronephrosis visualized. Abdominal aorta: No aneurysm visualized. Other findings: Small amount of perihepatic ascites noted. IMPRESSION: Heterogeneous echotexture throughout the liver with slightly nodular contours compatible with cirrhosis. Small amount of perihepatic ascites. Splenomegaly. Electronically Signed   By: Rolm Baptise M.D.   On: 07/06/2017 09:48   US Liver Doppler  Result Date: 07/06/2017 CLINICAL DATA:  Nonalcoholic hepatic steatosis EXAM: DUPLEX ULTRASOUND OF LIVER TECHNIQUE: Color and duplex Doppler ultrasound was performed to evaluate the hepatic in-flow and out-flow vessels. COMPARISON:  11/24/2016 FINDINGS: Portal Vein Velocities Main:  50 cm/sec Right:  19 cm/sec Left:  30 cm/sec Hepatic Vein Velocities Right:  34 cm/sec Middle:  32 cm/sec Left:  49 cm/sec Hepatic Artery Velocity:  243 cm/sec Splenic Vein Velocity:  21 cm/sec Varices: Absent Ascites: Present about the liver. Portal veins are hepato pedal in directionality. Hepatic veins are hepatofugal inflow. There is no evidence of portal vein thrombosis. There is normal directionality in the splenic vein IMPRESSION: Hepatic and portal veins are patent with normal directionality of flow. Ascites is present about the liver. Electronically Signed   By: Marybelle Killings M.D.   On: 07/06/2017 09:50   EGD: -  Grade I esophageal varices.                           - Scar in the lower third of the esophagus.                           - Z-line regular, 35 cm from the incisors.                           - 2 cm hiatal hernia.                           - Portal hypertensive gastropathy.                           - Gastric antral vascular ectasia with bleeding.                            Treated with argon plasma coagulation (APC).                           - Mucosal changes in the duodenum.                           - No specimens collected.   Subjective: Feeling better today. No shortness of breath  Discharge Exam: Vitals:   07/10/17 0435 07/10/17 0823  BP: (!) 108/44   Pulse: 95   Resp: 18   Temp: 98.6 F (37 C)   SpO2: 98% 92%   Vitals:   07/09/17 2009 07/10/17 0435 07/10/17 0647 07/10/17 0823  BP: (!) 120/37 (!) 108/44    Pulse: 89 95    Resp: 18 18    Temp: 98.8 F (37.1 C) 98.6 F (37 C)    TempSrc: Oral Oral    SpO2: 99% 98%  92%  Weight:   102.4 kg (225 lb 11.2 oz)   Height:        General: Pt is alert, awake, not in acute distress Cardiovascular: RRR, S1/S2 +, no rubs, no gallops Respiratory: CTA bilaterally, no wheezing, no rhonchi Abdominal: Soft, NT, ND, bowel sounds + Extremities: no edema, no cyanosis    The results of significant diagnostics from this hospitalization (including imaging, microbiology, ancillary and laboratory) are listed below for reference.     Microbiology: No results found for this or any previous visit (from the past 240 hour(s)).   Labs: BNP (last 3 results)  Recent Labs  04/24/17 1450  BNP 27.2   Basic Metabolic Panel:  Recent Labs Lab 07/06/17 0624 07/07/17 0553 07/07/17 0555 07/08/17 0544 07/09/17 0153 07/10/17 0405  NA  139 138  --  139 141 138  K 3.2* 2.6*  --  2.3* 3.2* 3.5  CL 104 100*  --  96* 102 103  CO2 27 30  --  33* 31 28  GLUCOSE 161* 87  --  102* 153* 125*  BUN 10 9  --  10 12 11   CREATININE 0.91 0.90   --  0.99 0.97 0.95  CALCIUM 8.7* 8.7*  --  8.7* 8.7* 8.5*  MG  --   --  1.5* 1.8  --   --    Liver Function Tests:  Recent Labs Lab 07/05/17 0729 07/09/17 0153 07/10/17 0405  AST 44*  --   --   ALT 22  --   --   ALKPHOS 92  --   --   BILITOT 2.7* 2.6* 2.3*  PROT 5.7*  --   --   ALBUMIN 2.9*  --  2.4*   No results for input(s): LIPASE, AMYLASE in the last 168 hours. No results for input(s): AMMONIA in the last 168 hours. CBC:  Recent Labs Lab 07/06/17 0624 07/07/17 0553 07/08/17 0544 07/09/17 0153 07/10/17 0405  WBC 4.7 6.6 4.8 4.7 4.5  HGB 8.1* 7.9* 8.6* 8.7* 8.5*  HCT 26.7* 25.7* 27.9* 28.4* 28.5*  MCV 83.4 84.5 84.0 86.6 88.8  PLT 43* 38* 38* 37* 29*   Cardiac Enzymes:  Recent Labs Lab 07/09/17 0153 07/09/17 0641  TROPONINI <0.03 <0.03   BNP: Invalid input(s): POCBNP CBG:  Recent Labs Lab 07/09/17 2009 07/09/17 2342 07/10/17 0431 07/10/17 0717 07/10/17 1138  GLUCAP 189* 134* 117* 111* 175*   D-Dimer No results for input(s): DDIMER in the last 72 hours. Hgb A1c No results for input(s): HGBA1C in the last 72 hours. Lipid Profile No results for input(s): CHOL, HDL, LDLCALC, TRIG, CHOLHDL, LDLDIRECT in the last 72 hours. Thyroid function studies No results for input(s): TSH, T4TOTAL, T3FREE, THYROIDAB in the last 72 hours.  Invalid input(s): FREET3 Anemia work up  [employer]  07/10/17 0405  RETICCTPCT 12.1*   Urinalysis No results found for: COLORURINE, APPEARANCEUR, LABSPEC, [place], GLUCOSEU, HGBUR, BILIRUBINUR, KETONESUR, PROTEINUR, UROBILINOGEN, NITRITE, LEUKOCYTESUR Sepsis Labs Invalid input(s): PROCALCITONIN,  WBC,  LACTICIDVEN Microbiology No results found for this or any previous visit (from the past 240 hour(s)).   Time coordinating discharge: Over 30 minutes  SIGNED:   Kathie Dike, MD  [facility] 07/10/2017, 1:11 PM Pager   If 7PM-7AM, please contact night-coverage [email or web address] Password [email or web address]

## 2017-07-10 NOTE — Progress Notes (Signed)
Nutrition Education Note  RD consulted for nutrition education regarding pt request with regards to her salt intake.  RD provided "Heart Failure Nutrition Therapy" handout from the Academy of Nutrition and Dietetics. Reviewed patient's dietary recall. Pt reports consuming either oatmeal with peanut butter or egg and cheese omelette for breakfast, a snack of fruit or yogurt and a big dinner example given was whole grain spaghetti with ground [place]. Pt reports no use of salt shaker when cooking or eating foods at home.   Provided examples on ways to decrease sodium intake in diet. Discouraged intake of processed foods and use of salt shaker. Encouraged fresh fruits and vegetables as well as whole grain sources of carbohydrates to maximize fiber intake. Pt reports she consumes raw vegetables such as carrots and cucumbers with no salad dressing.   RD discussed why it is important for patient to adhere to diet recommendations, and emphasized the role of fluids, foods to avoid, and importance of weighing self daily.  Of note, pt also with DM and requested information regarding this diet. RD provided "Carbohydrate Counting for People with Diabetes" handout from the Academy of Nutrition and Dietetics. Discussed different food groups and their effects on blood sugar, emphasizing carbohydrate-containing foods. Provided list of carbohydrates and recommended serving sizes of common foods.  Pt seems nervous regarding carbohydrate consumption. Discussed importance of controlled and consistent carbohydrate intake throughout the day. Provided examples of ways to balance meals/snacks and encouraged intake of high-fiber, whole grain complex carbohydrates. It can be dangerous to not consume adequate carbohydrates. Pt reports occasional hypoglycemic episodes and consuming yogurt or fruits, stating "one night I was up half the night trying to get them back to normal". RD educated pt on simple sugars to increase blood  sugars quickly. Teach back method used.  For both of these diet regulations RD discussed consumption of adequate and consistent nutrition and the reading food labels.   Expect good compliance. Pt has already made many dietary changes and reports she feels so much better with all the fluid removed.   Body mass index is 39.98 kg/m. Pt meets criteria for morbid obesity based on current BMI.  Current diet order is carbohydrate modified, patient is consuming approximately 80% of meals at this time. Labs and medications reviewed. No further nutrition interventions warranted at this time. RD contact information provided. If additional nutrition issues arise, please re-consult RD.   Parks Ranger, MS, RDN, LDN 07/10/2017 11:29 AM

## 2017-07-10 NOTE — Care Management Note (Signed)
Case Management Note  Patient Details  Name: Maria [name] MRN: [id number] Date of Birth: 04/04/1956   If discussed at Long Length of Stay Meetings, dates discussed:  07/10/2017  Additional Comments:  Gilmar Bua, Chauncey Reading, RN 07/10/2017, 12:44 PM

## 2017-07-10 NOTE — Progress Notes (Signed)
  Subjective:  She feels better today. She has good appetite. She does not recall the last time she weighed 225 pounds. She does not feel dizzy or weak like she did yesterday. She had normal bowel movement today.    Objective: Blood pressure (!) 108/44, pulse 95, temperature 98.6 F (37 C), temperature source Oral, resp. rate 18, height 5' 3"  (1.6 m), weight 225 lb 11.2 oz (102.4 kg), SpO2 92 %. Patient is alert and in no acute distress. She has a few ecchymosis over her left forearm and arm. Abdomen is full but soft and nontender. Trace edema around ankles.  Labs/studies Results:   Recent Labs  07/08/17 0544 07/09/17 0153 07/10/17 0405  WBC 4.8 4.7 4.5  HGB 8.6* 8.7* 8.5*  HCT 27.9* 28.4* 28.5*  PLT 38* 37* 29*    Retic count 12.1% Absolute reticulocyte count 388.4  BMET   Recent Labs  07/08/17 0544 07/09/17 0153 07/10/17 0405  NA 139 141 138  K 2.3* 3.2* 3.5  CL 96* 102 103  CO2 33* 31 28  GLUCOSE 102* 153* 125*  BUN 10 12 11   CREATININE 0.99 0.97 0.95  CALCIUM 8.7* 8.7* 8.5*    LFT   Recent Labs  07/09/17 0153 07/10/17 0405  ALBUMIN  --  2.4*  BILITOT 2.6* 2.3*    PT/INR   Recent Labs  07/10/17 0405  LABPROT 17.5*  INR 1.45      Assessment:  1. Iron deficiency anemia secondary to leading from GAVE. Status post APC ablation on 07/06/2017. She has received 3 units of PRBCs. No evidence of active bleeding. Hemoglobin remains low but stable. He has reticulocytosis which is reassuring.  #2. Cirrhosis secondary to NASH complicated by esophageal variceal bleed(2011 and 2013) requiring multiple banding sessions. EGD last week revealed 2 short columns of grade 1 esophageal varices. Now she has developed GAVE with evidence of active bleeding treated with APC. Patient would like to go to [facility] for transplant evaluation instead of going back to [facility]. Meld score today is 14.  #3. Fluid overload. She has lost another 4 pounds in the last 24  hours. All in all she has lost 33 pounds. Diuretic therapy has been discontinued. She is not having orthostatic symptoms today.  #4. Chronic thrombocytopenia secondary to chronic liver disease and splenomegaly. Her count is lower than it should be because of platelet clumping.   Recommendations:  Stable for discharge from GI standpoint. Consider resuming loop diuretic at a low-dose when weight close to 235 lbs. She can also start spironolactone 100 mg daily at bedtime. Will plan to check CBC in one week. Will arrange referral to [facility] once results of right heart cath known(to be done in 2 days).

## 2017-07-10 NOTE — Progress Notes (Signed)
IV removed. Site clean, dry and intact.  AVS discussed with  Patient and husband.  Verbalized understanding.  Pt taken down by tech.  Stable upon discharge.

## 2017-07-10 NOTE — Progress Notes (Addendum)
Inpatient Diabetes Program Recommendations  AACE/ADA: New Consensus Statement on Inpatient Glycemic Control (2015)  Target Ranges:  Prepandial:   less than 140 mg/dL      Peak postprandial:   less than 180 mg/dL (1-2 hours)      Critically ill patients:  140 - 180 mg/dL   Results for ANEYA, DADDONA (MRN [id number]) as of 07/10/2017 11:03  Ref. Range 07/09/2017 01:08 07/09/2017 05:02 07/09/2017 07:32 07/09/2017 11:44 07/09/2017 16:17 07/09/2017 20:09  Glucose-Capillary Latest Ref Range: 65 - 99 mg/dL 153 (H) 151 (H) 166 (H) 208 (H) 195 (H) 189 (H)   Results for TAMEKO, HALDER (MRN [id number]) as of 07/10/2017 11:03  Ref. Range 07/09/2017 23:42 07/10/2017 04:31 07/10/2017 07:17  Glucose-Capillary Latest Ref Range: 65 - 99 mg/dL 134 (H) 117 (H) 111 (H)    Home DM Meds: Insulin Pump  Current Insulin Orders: Insulin Pump Q4 hours      Per nursing documentation, patient remains A&O and able to independently manage insulin pump.  Set/Site/Reservoir changed 10/22.  CBGs stable.  See DM Coordinator note from 10/19 for most current Insulin pump settings.    MD- Please modify Insulin Pump orders to TID AC + HS-- Currently ordered Q4 hours and patient eating solid PO diet.     --Will follow patient during hospitalization--  Wyn Quaker RN, MSN, CDE Diabetes Coordinator Inpatient Glycemic Control Team Team Pager: [phone] (8a-5p)

## 2017-07-11 ENCOUNTER — Other Ambulatory Visit (INDEPENDENT_AMBULATORY_CARE_PROVIDER_SITE_OTHER): Payer: Self-pay | Admitting: *Deleted

## 2017-07-11 ENCOUNTER — Encounter (INDEPENDENT_AMBULATORY_CARE_PROVIDER_SITE_OTHER): Payer: Self-pay | Admitting: *Deleted

## 2017-07-11 DIAGNOSIS — K7460 Unspecified cirrhosis of liver: Principal | ICD-10-CM

## 2017-07-11 DIAGNOSIS — K31819 Angiodysplasia of stomach and duodenum without bleeding: Secondary | ICD-10-CM

## 2017-07-11 DIAGNOSIS — I8510 Secondary esophageal varices without bleeding: Secondary | ICD-10-CM

## 2017-07-12 ENCOUNTER — Ambulatory Visit (HOSPITAL_COMMUNITY)
Admission: RE | Disposition: A | Discharge status: Home / Self Care | Payer: Self-pay | Source: Ambulatory Visit | Attending: Cardiology

## 2017-07-12 ENCOUNTER — Encounter (INDEPENDENT_AMBULATORY_CARE_PROVIDER_SITE_OTHER): Payer: Self-pay | Admitting: *Deleted

## 2017-07-12 ENCOUNTER — Ambulatory Visit (HOSPITAL_COMMUNITY)
Admission: RE | Admit: 2017-07-12 | Discharge: 2017-07-12 | Disposition: A | Discharge status: Home / Self Care | Payer: PPO | Source: Ambulatory Visit | Attending: Cardiology | Admitting: Cardiology

## 2017-07-12 ENCOUNTER — Encounter (HOSPITAL_COMMUNITY): Payer: Self-pay | Admitting: Cardiology

## 2017-07-12 DIAGNOSIS — R161 Splenomegaly, not elsewhere classified: Secondary | ICD-10-CM | POA: Diagnosis not present

## 2017-07-12 DIAGNOSIS — D696 Thrombocytopenia, unspecified: Secondary | ICD-10-CM | POA: Diagnosis not present

## 2017-07-12 DIAGNOSIS — I509 Heart failure, unspecified: Secondary | ICD-10-CM | POA: Diagnosis not present

## 2017-07-12 DIAGNOSIS — K7460 Unspecified cirrhosis of liver: Secondary | ICD-10-CM | POA: Insufficient documentation

## 2017-07-12 DIAGNOSIS — I5032 Chronic diastolic (congestive) heart failure: Secondary | ICD-10-CM | POA: Insufficient documentation

## 2017-07-12 DIAGNOSIS — D649 Anemia, unspecified: Secondary | ICD-10-CM | POA: Insufficient documentation

## 2017-07-12 DIAGNOSIS — I2510 Atherosclerotic heart disease of native coronary artery without angina pectoris: Secondary | ICD-10-CM | POA: Insufficient documentation

## 2017-07-12 HISTORY — PX: RIGHT HEART CATH: CATH118263

## 2017-07-12 LAB — GLUCOSE, CAPILLARY
Glucose-Capillary: 160 mg/dL — ABNORMAL HIGH (ref 65–99)
Glucose-Capillary: 159 mg/dL — ABNORMAL HIGH (ref 65–99)

## 2017-07-12 LAB — POCT I-STAT 3, VENOUS BLOOD GAS (G3P V)
Acid-Base Excess: 2 mmol/L (ref 0.0–2.0)
Acid-Base Excess: 2 mmol/L (ref 0.0–2.0)
Bicarbonate: 25 mmol/L (ref 20.0–28.0)
Bicarbonate: 25.5 mmol/L (ref 20.0–28.0)
O2 Saturation: 81 %
O2 Saturation: 82 %
TCO2: 26 mmol/L (ref 22–32)
TCO2: 27 mmol/L (ref 22–32)
pCO2, Ven: 33.1 mmHg — ABNORMAL LOW (ref 44.0–60.0)
pCO2, Ven: 34.1 mmHg — ABNORMAL LOW (ref 44.0–60.0)
pH, Ven: 7.483 — ABNORMAL HIGH (ref 7.250–7.430)
pH, Ven: 7.487 — ABNORMAL HIGH (ref 7.250–7.430)
pO2, Ven: 41 mmHg (ref 32.0–45.0)
pO2, Ven: 42 mmHg (ref 32.0–45.0)

## 2017-07-12 SURGERY — RIGHT HEART CATH
Anesthesia: LOCAL

## 2017-07-12 MED ORDER — SODIUM CHLORIDE 0.9 % IV SOLN
INTRAVENOUS | Status: AC | PRN
  Administered 2017-07-12: 10 mL/h via INTRAVENOUS

## 2017-07-12 MED ORDER — SODIUM CHLORIDE 0.9% FLUSH: 3.0000 mL | Freq: Two times a day (BID) | INTRAVENOUS | Status: DC

## 2017-07-12 MED ORDER — SODIUM CHLORIDE 0.9% FLUSH: 3.0000 mL | INTRAVENOUS | Status: DC | PRN

## 2017-07-12 MED ORDER — ONDANSETRON HCL 4 MG/2ML IJ SOLN: 4.0000 mg | Freq: Four times a day (QID) | INTRAMUSCULAR | Status: DC | PRN

## 2017-07-12 MED ORDER — SODIUM CHLORIDE 0.9 % IV SOLN: INTRAVENOUS | Status: DC

## 2017-07-12 MED ORDER — FENTANYL CITRATE (PF) 100 MCG/2ML IJ SOLN
INTRAMUSCULAR | Status: AC
  Filled 2017-07-12: qty 2

## 2017-07-12 MED ORDER — MIDAZOLAM HCL 2 MG/2ML IJ SOLN
INTRAMUSCULAR | Status: DC | PRN
  Administered 2017-07-12: 1 mg via INTRAVENOUS

## 2017-07-12 MED ORDER — ACETAMINOPHEN 325 MG PO TABS
650.0000 mg | ORAL_TABLET | ORAL | Status: DC | PRN
  Administered 2017-07-12: 650 mg via ORAL

## 2017-07-12 MED ORDER — FENTANYL CITRATE (PF) 100 MCG/2ML IJ SOLN
INTRAMUSCULAR | Status: DC | PRN
  Administered 2017-07-12: 25 ug via INTRAVENOUS

## 2017-07-12 MED ORDER — SODIUM CHLORIDE 0.9 % IV SOLN: 250.0000 mL | INTRAVENOUS | Status: DC | PRN

## 2017-07-12 MED ORDER — HEPARIN (PORCINE) IN NACL 2-0.9 UNIT/ML-% IJ SOLN
INTRAMUSCULAR | Status: AC | PRN
  Administered 2017-07-12: 500 mL via INTRA_ARTERIAL

## 2017-07-12 MED ORDER — LIDOCAINE HCL (PF) 1 % IJ SOLN
INTRAMUSCULAR | Status: DC | PRN
  Administered 2017-07-12: 2 mL via INTRADERMAL

## 2017-07-12 MED ORDER — ACETAMINOPHEN 325 MG PO TABS
ORAL_TABLET | ORAL | Status: AC
  Filled 2017-07-12: qty 2

## 2017-07-12 MED ORDER — LIDOCAINE HCL 2 % IJ SOLN
INTRAMUSCULAR | Status: AC
  Filled 2017-07-12: qty 20

## 2017-07-12 MED ORDER — MIDAZOLAM HCL 2 MG/2ML IJ SOLN
INTRAMUSCULAR | Status: AC
  Filled 2017-07-12: qty 2

## 2017-07-12 SURGICAL SUPPLY — 8 items
CATH BALLN WEDGE 5F 110CM (CATHETERS) ×1 IMPLANT
PACK CARDIAC CATHETERIZATION (CUSTOM PROCEDURE TRAY) ×2 IMPLANT
SHEATH GLIDE SLENDER 4/5FR (SHEATH) ×1 IMPLANT
TRANSDUCER W/STOPCOCK (MISCELLANEOUS) ×2 IMPLANT
TUBING ART PRESS 72  MALE/FEM (TUBING) ×1
TUBING ART PRESS 72 MALE/FEM (TUBING) IMPLANT
WIRE COUGAR XT STRL 190CM (WIRE) ×1 IMPLANT
WIRE EMERALD 3MM-J .025X260CM (WIRE) ×1 IMPLANT

## 2017-07-12 NOTE — H&P (View-Only) (Signed)
Advanced Heart Failure Clinic Note   PCP: Dr. Woody Seller HF Cardiology: Dr. Aundra Dubin  60 yo sent for CHF clinic evaluation due to progressive dyspnea.  Patient has been having exertional dypsnea for a year or so.  She had been short of breath walking about 100 yards.  Echo in 3/18 showed mild LVH and mild MR with normal EF.  High resolution CT lungs in 5/18 did not have evidence for interstitial lung disease.   She additionally has history of NASH with cirrhosis and variceal bleeding as well as splenomegaly, followed by Dr. Laural Golden.   She was sent for right heart cath as there was worry for possible portopulmonary hypertension.  RHC showed elevated right and left heart filling pressures with pulmonary venous hypertension.  She had been taking Lasix prn.  After the cath, Lasix increased to 40 mg daily.   She presents today for 2 week follow up. At last visit lasix increased. She is up another 5 lbs.  She had an iron infusion earlier this week and has felt worse since, with occasional chest pressures (especially when she is SOB.).  She is SOB with ADLs including bathing and dressing.  Chronic 2 pillow orthopnea. No BRBPR/melena to her knowledge. She is tired and frustrated of feeling bad. Feels like urine output has tapered off on lasix.   Labs (7/18): K 3.9, creatinine 0.8, hgb 8.1                                            Review of systems complete and found to be negative unless listed in HPI.                                                                  PMH:  1. NASH with cirrhosis: H/o variceal bleeding.  2. Splenomegaly related to cirrhosis.  3. Sleep study 6/8 showed no significant OSA.  4. High resolution CT chest (5/18): No interstitial lung disease, +calcium in coronaries.  5. Chronic diastolic CHF:  - Echo (7/89): EF 60-65%, mild LVH, mild MR.  Negative bubble study.  - RHC (7/18): mean RA 16, PA 64/25, mean PCWP 21, CI 4.16, PVR 2.6 WU.  6. Anemia 7. Thrombocytopenia (likely related  to splenomegaly).   Social History   Social History  . Marital status: Married    Spouse name: N/A  . Number of children: N/A  . Years of education: N/A   Occupational History  . retired     Social History Main Topics  . Smoking status: Never Smoker  . Smokeless tobacco: Never Used  . Alcohol use No  . Drug use: No  . Sexual activity: Not on file   Other Topics Concern  . Not on file   Social History Narrative   Lives with husband    Family History  Problem Relation Age of Onset  . Lung cancer Mother   . Diabetes Father   . Diabetes Sister   . Hypertension Sister   . Hypothyroidism Sister   . Colon cancer Brother   . Diabetes Sister   . Hypothyroidism Brother   . Healthy Daughter   . Obesity Daughter   .  Hypertension Daughter    Current Outpatient Prescriptions  Medication Sig Dispense Refill  . cholecalciferol (VITAMIN D) 1000 UNITS tablet Take 1,000 Units by mouth every Monday, Wednesday, and Friday.     . ferrous sulfate 325 (65 FE) MG tablet Take 325 mg by mouth 2 (two) times daily with a meal.    . fluticasone furoate-vilanterol (BREO ELLIPTA) 200-25 MCG/INH AEPB Inhale 1 puff into the lungs daily. 1 each 3  . furosemide (LASIX) 40 MG tablet Take 1 tablet (40 mg total) by mouth 2 (two) times daily. 9030 tablet 3  . hydroxypropyl methylcellulose / hypromellose (ISOPTO TEARS / GONIOVISC) 2.5 % ophthalmic solution Place 1 drop into both eyes 3 (three) times daily as needed for dry eyes.    . Insulin Human (INSULIN PUMP) SOLN Inject into the skin daily.    . insulin lispro (HUMALOG) 100 UNIT/ML injection Inject into the skin 3 (three) times daily before meals. RATE AS FOLLOWS: 1200-0300 3.55 UNITS/HR, 0300-0830=3.70 UNITS/HR, 0830-1430=5.05 UNITS/HR, 1430-1900=5.10 UNITS/HR, & 1900-2359=4.65 UNITS/HR. Patient may bolus as needed with meals.    Marland Kitchen levothyroxine (SYNTHROID, LEVOTHROID) 25 MCG tablet Take 25 mcg by mouth daily before breakfast.     . OXYGEN Inhale 2 L  into the lungs every evening. WITH SLEEP.    Marland Kitchen pantoprazole (PROTONIX) 40 MG tablet Take 1 tablet by mouth once daily 90 tablet 3  . PARoxetine (PAXIL) 40 MG tablet Take 40 mg by mouth every morning.    . potassium chloride SA (K-DUR,KLOR-CON) 20 MEQ tablet Take 20 mEq by mouth 2 (two) times daily.    . propranolol (INDERAL) 20 MG tablet Take 20 mg by mouth See admin instructions. Take 1 tablet (20 mg) by mouth scheduled in the morning; may repeat dose in the evening if needed for elevated blood pressure.    . rosuvastatin (CRESTOR) 5 MG tablet Take 1 tablet (5 mg total) by mouth every other day. 45 tablet 3   No current facility-administered medications for this encounter.    Vitals:   07/04/17 1123  BP: 134/68  Pulse: 77  SpO2: 98%  Weight: 257 lb 6.4 oz (116.8 kg)   Wt Readings from Last 3 Encounters:  07/04/17 257 lb 6.4 oz (116.8 kg)  07/02/17 252 lb (114.3 kg)  06/11/17 252 lb 12.8 oz (114.7 kg)    General: Chronically ill and elderly appearing. No resp difficulty. HEENT: Normal Neck: Supple. JVP to jaw. Carotids 2+ bilat; no bruits. No thyromegaly or nodule noted. Cor: PMI nondisplaced. RRR, 2/6 SEM RUSB.  Lungs: CTAB, normal effort. Abdomen: Soft, non-tender, non-distended, no HSM. No bruits or masses. +BS  Extremities: No cyanosis, clubbing, or rash. 1-2+ edema to knees. Trace thigh edema.  Neuro: Alert & orientedx3, cranial nerves grossly intact. moves all 4 extremities w/o difficulty. Affect pleasant   Assessment/Plan: 1. Chronic diastolic CHF: RHC in 3/71 is suggestive of significant diastolic CHF.  EF was normal by echo. - Volume status elevated on exam.  - NYHA III-IIIb symptoms - Stop lasix. Start torsemide 60 mg daily started today.  - Will send prescription for 2.5 mg metolazone to take AS DIRECTED by HF clinic. Have asked her to call back on Friday if not feeling better for instructions.  - Reinforced fluid restriction to < 2 L daily, sodium restriction to less  than 2000 mg daily, and the importance of daily weights.   2. Pulmonary hypertension: Patient is at risk for portopulmonary hypertension.   However, [place] in 7/18 showed pulmonary venous  hypertension (group 2).   - Discussed with Dr. Aundra Dubin. Will plan for RHC next week.  3. Cirrhosis: NASH with splenomegaly, thrombocytopenia.  4. CAD: Coronary calcium on CT chest in 5/18.   - In absence of chest pain, would not do stress test or cath => poor candidate for invasive cardiac evaluation and stent placement with very low platelets.  - No s/s of ischemia.  She has chest pressure at times with SOB, but she is very volume overloaded on exam.  - Lipids OK last visit. Continue crestor 5. Anemia:  - Per her GI MD.  - Getting Iron infusions and Blood prn.  6. Thrombocytopenia: Likely related to cirrhosis and splenomegaly. No change.   Switch to torsemide as above. RHC next week. Pre-cath labs today. Follow up 2 weeks to re-assess volume status and repeat labs.   Shirley Friar, PA-C  07/04/2017   Greater than 50% of the 25 minute visit was spent in counseling/coordination of care regarding disease state educations, sliding scale diuretics, salt/fluid restriction, and medication reconciliation.

## 2017-07-12 NOTE — Progress Notes (Addendum)
Patient may discharge once 45 minutes of bedrest is over per Dr. Aundra Dubin.

## 2017-07-12 NOTE — Discharge Instructions (Signed)
°  Brachial Site Care Refer to this sheet in the next few weeks. These instructions provide you with information about caring for yourself after your procedure. Your health care provider may also give you more specific instructions. Your treatment has been planned according to current medical practices, but problems sometimes occur. Call your health care provider if you have any problems or questions after your procedure. What can I expect after the procedure? After your procedure, it is typical to have the following:  Bruising at the site that usually fades within 1-2 weeks.  Blood collecting in the tissue (hematoma) that may be painful to the touch. It should usually decrease in size and tenderness within 1-2 weeks.  Follow these instructions at home:  Take medicines only as directed by your health care provider.  You may shower 24-48 hours after the procedure or as directed by your health care provider. Remove the bandage (dressing) and gently wash the site with plain soap and water. Pat the area dry with a clean towel. Do not rub the site, because this may cause bleeding.  Do not take baths, swim, or use a hot tub until your health care provider approves.  Check your insertion site every day for redness, swelling, or drainage.  Do not apply powder or lotion to the site.  Do not flex or bend the affected arm for 24 hours or as directed by your health care provider.  Do not push or pull heavy objects with the affected arm for 24 hours or as directed by your health care provider.  Do not lift over 10 lb (4.5 kg) for 5 days after your procedure or as directed by your health care provider.  Ask your health care provider when it is okay to: ? Return to work or school. ? Resume usual physical activities or sports. ? Resume sexual activity.  Do not drive home if you are discharged the same day as the procedure. Have someone else drive you.  You may drive 24 hours after the procedure  unless otherwise instructed by your health care provider.  Do not operate machinery or power tools for 24 hours after the procedure.  If your procedure was done as an outpatient procedure, which means that you went home the same day as your procedure, a responsible adult should be with you for the first 24 hours after you arrive home.  Keep all follow-up visits as directed by your health care provider. This is important. Contact a health care provider if:  You have a fever.  You have chills.  You have increased bleeding from the site. Hold pressure on the site. Get help right away if:  You have unusual pain at the brachial site.  You have redness, warmth, or swelling at the radial site.  You have drainage (other than a small amount of blood on the dressing) from the site.  The site is bleeding, and the bleeding does not stop after 30 minutes of holding steady pressure on the site.  Your arm or hand becomes pale, cool, tingly, or numb. This information is not intended to replace advice given to you by your health care provider. Make sure you discuss any questions you have with your health care provider. Document Released: 10/07/2010 Document Revised: 02/10/2016 Document Reviewed: 03/23/2014 Elsevier Interactive Patient Education  2018 [employer].

## 2017-07-12 NOTE — Interval H&P Note (Signed)
History and Physical Interval Note:  07/12/2017 9:13 AM  Maria [name]  has presented today for surgery, with the diagnosis of pulmonary hypertension  The various methods of treatment have been discussed with the patient and family. After consideration of risks, benefits and other options for treatment, the patient has consented to  Procedure(s): RIGHT HEART CATH (N/A) as a surgical intervention .  The patient's history has been reviewed, patient examined, no change in status, stable for surgery.  I have reviewed the patient's chart and labs.  Questions were answered to the patient's satisfaction.     Averyana Pillars [employer]

## 2017-07-13 MED FILL — Lidocaine HCl Local Inj 2%: INTRAMUSCULAR | Qty: 20 | Status: AC

## 2017-07-13 MED FILL — Lidocaine HCl Local Inj 2%: INTRAMUSCULAR | Qty: 10 | Status: CN

## 2017-07-18 ENCOUNTER — Emergency Department (HOSPITAL_COMMUNITY): Payer: PPO

## 2017-07-18 ENCOUNTER — Encounter (HOSPITAL_COMMUNITY): Payer: Self-pay

## 2017-07-18 ENCOUNTER — Observation Stay (HOSPITAL_COMMUNITY): Payer: PPO

## 2017-07-18 ENCOUNTER — Inpatient Hospital Stay (HOSPITAL_COMMUNITY)
Admission: EM | Admit: 2017-07-18 | Discharge: 2017-07-22 | DRG: 441 | Disposition: A | Discharge status: Home / Self Care | Payer: PPO | Attending: Internal Medicine | Admitting: Internal Medicine

## 2017-07-18 ENCOUNTER — Emergency Department (HOSPITAL_COMMUNITY)
Admission: EM | Admit: 2017-07-18 | Discharge: 2017-07-18 | Disposition: A | Discharge status: Nursing Home (Includes ICF) | Payer: Self-pay

## 2017-07-18 DIAGNOSIS — K219 Gastro-esophageal reflux disease without esophagitis: Secondary | ICD-10-CM | POA: Diagnosis not present

## 2017-07-18 DIAGNOSIS — I5032 Chronic diastolic (congestive) heart failure: Secondary | ICD-10-CM

## 2017-07-18 DIAGNOSIS — K7290 Hepatic failure, unspecified without coma: Principal | ICD-10-CM | POA: Diagnosis present

## 2017-07-18 DIAGNOSIS — E1043 Type 1 diabetes mellitus with diabetic autonomic (poly)neuropathy: Secondary | ICD-10-CM | POA: Diagnosis present

## 2017-07-18 DIAGNOSIS — T473X5A Adverse effect of saline and osmotic laxatives, initial encounter: Secondary | ICD-10-CM | POA: Diagnosis present

## 2017-07-18 DIAGNOSIS — K521 Toxic gastroenteritis and colitis: Secondary | ICD-10-CM | POA: Diagnosis not present

## 2017-07-18 DIAGNOSIS — D509 Iron deficiency anemia, unspecified: Secondary | ICD-10-CM | POA: Diagnosis present

## 2017-07-18 DIAGNOSIS — K3184 Gastroparesis: Secondary | ICD-10-CM | POA: Diagnosis present

## 2017-07-18 DIAGNOSIS — D696 Thrombocytopenia, unspecified: Secondary | ICD-10-CM | POA: Diagnosis not present

## 2017-07-18 DIAGNOSIS — G9341 Metabolic encephalopathy: Secondary | ICD-10-CM | POA: Diagnosis present

## 2017-07-18 DIAGNOSIS — R404 Transient alteration of awareness: Secondary | ICD-10-CM | POA: Diagnosis not present

## 2017-07-18 DIAGNOSIS — I110 Hypertensive heart disease with heart failure: Secondary | ICD-10-CM | POA: Diagnosis present

## 2017-07-18 DIAGNOSIS — Z8719 Personal history of other diseases of the digestive system: Secondary | ICD-10-CM

## 2017-07-18 DIAGNOSIS — R402413 Glasgow coma scale score 13-15, at hospital admission: Secondary | ICD-10-CM | POA: Diagnosis present

## 2017-07-18 DIAGNOSIS — E785 Hyperlipidemia, unspecified: Secondary | ICD-10-CM | POA: Diagnosis present

## 2017-07-18 DIAGNOSIS — I2510 Atherosclerotic heart disease of native coronary artery without angina pectoris: Secondary | ICD-10-CM | POA: Diagnosis present

## 2017-07-18 DIAGNOSIS — M810 Age-related osteoporosis without current pathological fracture: Secondary | ICD-10-CM | POA: Diagnosis not present

## 2017-07-18 DIAGNOSIS — I8510 Secondary esophageal varices without bleeding: Secondary | ICD-10-CM | POA: Diagnosis not present

## 2017-07-18 DIAGNOSIS — G0430 Acute necrotizing hemorrhagic encephalopathy, unspecified: Secondary | ICD-10-CM | POA: Diagnosis not present

## 2017-07-18 DIAGNOSIS — E876 Hypokalemia: Secondary | ICD-10-CM | POA: Diagnosis not present

## 2017-07-18 DIAGNOSIS — K5900 Constipation, unspecified: Secondary | ICD-10-CM | POA: Diagnosis not present

## 2017-07-18 DIAGNOSIS — K7581 Nonalcoholic steatohepatitis (NASH): Secondary | ICD-10-CM | POA: Diagnosis present

## 2017-07-18 DIAGNOSIS — Z9981 Dependence on supplemental oxygen: Secondary | ICD-10-CM

## 2017-07-18 DIAGNOSIS — K766 Portal hypertension: Secondary | ICD-10-CM | POA: Diagnosis present

## 2017-07-18 DIAGNOSIS — K7682 Hepatic encephalopathy: Secondary | ICD-10-CM

## 2017-07-18 DIAGNOSIS — M797 Fibromyalgia: Secondary | ICD-10-CM | POA: Diagnosis present

## 2017-07-18 DIAGNOSIS — I6789 Other cerebrovascular disease: Secondary | ICD-10-CM | POA: Diagnosis not present

## 2017-07-18 DIAGNOSIS — Z885 Allergy status to narcotic agent status: Secondary | ICD-10-CM

## 2017-07-18 DIAGNOSIS — Z7951 Long term (current) use of inhaled steroids: Secondary | ICD-10-CM

## 2017-07-18 DIAGNOSIS — Z888 Allergy status to other drugs, medicaments and biological substances status: Secondary | ICD-10-CM

## 2017-07-18 DIAGNOSIS — E7220 Disorder of urea cycle metabolism, unspecified: Secondary | ICD-10-CM | POA: Diagnosis not present

## 2017-07-18 DIAGNOSIS — E039 Hypothyroidism, unspecified: Secondary | ICD-10-CM | POA: Diagnosis not present

## 2017-07-18 DIAGNOSIS — K7460 Unspecified cirrhosis of liver: Secondary | ICD-10-CM | POA: Diagnosis not present

## 2017-07-18 DIAGNOSIS — E860 Dehydration: Secondary | ICD-10-CM | POA: Diagnosis not present

## 2017-07-18 DIAGNOSIS — Z794 Long term (current) use of insulin: Secondary | ICD-10-CM

## 2017-07-18 DIAGNOSIS — Z9641 Presence of insulin pump (external) (internal): Secondary | ICD-10-CM | POA: Diagnosis present

## 2017-07-18 DIAGNOSIS — F329 Major depressive disorder, single episode, unspecified: Secondary | ICD-10-CM | POA: Diagnosis present

## 2017-07-18 DIAGNOSIS — Z79899 Other long term (current) drug therapy: Secondary | ICD-10-CM

## 2017-07-18 DIAGNOSIS — F4489 Other dissociative and conversion disorders: Secondary | ICD-10-CM | POA: Diagnosis not present

## 2017-07-18 DIAGNOSIS — F419 Anxiety disorder, unspecified: Secondary | ICD-10-CM | POA: Diagnosis present

## 2017-07-18 DIAGNOSIS — R4182 Altered mental status, unspecified: Secondary | ICD-10-CM | POA: Diagnosis not present

## 2017-07-18 DIAGNOSIS — M1990 Unspecified osteoarthritis, unspecified site: Secondary | ICD-10-CM | POA: Diagnosis present

## 2017-07-18 DIAGNOSIS — E119 Type 2 diabetes mellitus without complications: Secondary | ICD-10-CM

## 2017-07-18 DIAGNOSIS — R4020 Unspecified coma: Secondary | ICD-10-CM | POA: Diagnosis not present

## 2017-07-18 LAB — RAPID URINE DRUG SCREEN, HOSP PERFORMED
Amphetamines: NOT DETECTED
Barbiturates: NOT DETECTED
Benzodiazepines: NOT DETECTED
Cocaine: NOT DETECTED
Opiates: NOT DETECTED
Tetrahydrocannabinol: NOT DETECTED

## 2017-07-18 LAB — URINALYSIS, ROUTINE W REFLEX MICROSCOPIC
Bilirubin Urine: NEGATIVE
Glucose, UA: NEGATIVE mg/dL
Hgb urine dipstick: NEGATIVE
Ketones, ur: NEGATIVE mg/dL
Leukocytes, UA: NEGATIVE
Nitrite: NEGATIVE
Protein, ur: NEGATIVE mg/dL
Specific Gravity, Urine: 1.016 (ref 1.005–1.030)
pH: 5 (ref 5.0–8.0)

## 2017-07-18 LAB — MAGNESIUM: Magnesium: 1.9 mg/dL (ref 1.7–2.4)

## 2017-07-18 LAB — GLUCOSE, CAPILLARY
Glucose-Capillary: 142 mg/dL — ABNORMAL HIGH (ref 65–99)
Glucose-Capillary: 179 mg/dL — ABNORMAL HIGH (ref 65–99)
Glucose-Capillary: 200 mg/dL — ABNORMAL HIGH (ref 65–99)
Glucose-Capillary: 280 mg/dL — ABNORMAL HIGH (ref 65–99)
Glucose-Capillary: 258 mg/dL — ABNORMAL HIGH (ref 65–99)

## 2017-07-18 LAB — CBC
HCT: 31.3 % — ABNORMAL LOW (ref 36.0–46.0)
Hemoglobin: 9.7 g/dL — ABNORMAL LOW (ref 12.0–15.0)
MCH: 28.3 pg (ref 26.0–34.0)
MCHC: 31 g/dL (ref 30.0–36.0)
MCV: 91.3 fL (ref 78.0–100.0)
Platelets: 67 10*3/uL — ABNORMAL LOW (ref 150–400)
RBC: 3.43 MIL/uL — ABNORMAL LOW (ref 3.87–5.11)
RDW: 26.8 % — ABNORMAL HIGH (ref 11.5–15.5)
WBC: 7.7 10*3/uL (ref 4.0–10.5)

## 2017-07-18 LAB — COMPREHENSIVE METABOLIC PANEL
ALT: 20 U/L (ref 14–54)
AST: 32 U/L (ref 15–41)
Albumin: 2.9 g/dL — ABNORMAL LOW (ref 3.5–5.0)
Alkaline Phosphatase: 110 U/L (ref 38–126)
Anion gap: 9 (ref 5–15)
BUN: 19 mg/dL (ref 6–20)
CO2: 27 mmol/L (ref 22–32)
Calcium: 8.6 mg/dL — ABNORMAL LOW (ref 8.9–10.3)
Chloride: 102 mmol/L (ref 101–111)
Creatinine, Ser: 1.07 mg/dL — ABNORMAL HIGH (ref 0.44–1.00)
GFR calc Af Amer: >60 mL/min (ref >60)
GFR calc non Af Amer: 54 mL/min — ABNORMAL LOW (ref >60)
Glucose, Bld: 135 mg/dL — ABNORMAL HIGH (ref 65–99)
Potassium: 3.4 mmol/L — ABNORMAL LOW (ref 3.5–5.1)
Sodium: 138 mmol/L (ref 135–145)
Total Bilirubin: 1.6 mg/dL — ABNORMAL HIGH (ref 0.3–1.2)
Total Protein: 6.4 g/dL — ABNORMAL LOW (ref 6.5–8.1)

## 2017-07-18 LAB — HEMOGLOBIN A1C
Hgb A1c MFr Bld: 4.2 % — ABNORMAL LOW (ref 4.8–5.6)
Mean Plasma Glucose: 73.84 mg/dL

## 2017-07-18 LAB — DIFFERENTIAL
Basophils Absolute: 0.1 10*3/uL (ref 0.0–0.1)
Basophils Relative: 1 %
Eosinophils Absolute: 0.1 10*3/uL (ref 0.0–0.7)
Eosinophils Relative: 1 %
Lymphocytes Relative: 17 %
Lymphs Abs: 1.3 10*3/uL (ref 0.7–4.0)
Monocytes Absolute: 0.7 10*3/uL (ref 0.1–1.0)
Monocytes Relative: 9 %
Neutro Abs: 5.5 10*3/uL (ref 1.7–7.7)
Neutrophils Relative %: 72 %

## 2017-07-18 LAB — ETHANOL: Alcohol, Ethyl (B): <10 mg/dL (ref <10)

## 2017-07-18 LAB — TROPONIN I: Troponin I: <0.03 ng/mL (ref <0.03)

## 2017-07-18 LAB — AMMONIA: Ammonia: 85 umol/L — ABNORMAL HIGH (ref 9–35)

## 2017-07-18 LAB — PROTIME-INR
INR: 1.33
Prothrombin Time: 16.4 seconds — ABNORMAL HIGH (ref 11.4–15.2)

## 2017-07-18 LAB — APTT: aPTT: 32 seconds (ref 24–36)

## 2017-07-18 MED ORDER — ROSUVASTATIN CALCIUM 10 MG PO TABS
5.0000 mg | ORAL_TABLET | ORAL | Status: DC
  Administered 2017-07-18 – 2017-07-22 (×3): 5 mg via ORAL
  Filled 2017-07-18 (×4): qty 1

## 2017-07-18 MED ORDER — PAROXETINE HCL 20 MG PO TABS
40.0000 mg | ORAL_TABLET | ORAL | Status: DC
  Administered 2017-07-18 – 2017-07-22 (×5): 40 mg via ORAL
  Filled 2017-07-18 (×5): qty 2

## 2017-07-18 MED ORDER — LACTULOSE 10 GM/15ML PO SOLN
20.0000 g | Freq: Three times a day (TID) | ORAL | Status: DC
  Administered 2017-07-18 – 2017-07-20 (×8): 20 g via ORAL
  Filled 2017-07-18 (×9): qty 30

## 2017-07-18 MED ORDER — INSULIN ASPART 100 UNIT/ML ~~LOC~~ SOLN
4.0000 [IU] | Freq: Three times a day (TID) | SUBCUTANEOUS | Status: DC
  Administered 2017-07-18 – 2017-07-22 (×12): 4 [IU] via SUBCUTANEOUS

## 2017-07-18 MED ORDER — TORSEMIDE 20 MG PO TABS
60.0000 mg | ORAL_TABLET | Freq: Every day | ORAL | Status: DC
  Administered 2017-07-18 – 2017-07-19 (×2): 60 mg via ORAL
  Filled 2017-07-18 (×2): qty 3

## 2017-07-18 MED ORDER — MAGNESIUM SULFATE 2 GM/50ML IV SOLN
2.0000 g | Freq: Once | INTRAVENOUS | Status: AC
  Administered 2017-07-18: 2 g via INTRAVENOUS
  Filled 2017-07-18: qty 50

## 2017-07-18 MED ORDER — FLUTICASONE FUROATE-VILANTEROL 200-25 MCG/INH IN AEPB
1.0000 | INHALATION_SPRAY | Freq: Every day | RESPIRATORY_TRACT | Status: DC
  Administered 2017-07-18 – 2017-07-22 (×5): 1 via RESPIRATORY_TRACT
  Filled 2017-07-18: qty 28

## 2017-07-18 MED ORDER — INSULIN ASPART 100 UNIT/ML ~~LOC~~ SOLN
0.0000 [IU] | Freq: Every day | SUBCUTANEOUS | Status: DC
  Administered 2017-07-18: 3 [IU] via SUBCUTANEOUS
  Administered 2017-07-19 – 2017-07-21 (×2): 4 [IU] via SUBCUTANEOUS

## 2017-07-18 MED ORDER — LEVOTHYROXINE SODIUM 25 MCG PO TABS
25.0000 ug | ORAL_TABLET | Freq: Every day | ORAL | Status: DC
  Administered 2017-07-18 – 2017-07-22 (×5): 25 ug via ORAL
  Filled 2017-07-18 (×5): qty 1

## 2017-07-18 MED ORDER — INSULIN ASPART 100 UNIT/ML ~~LOC~~ SOLN
0.0000 [IU] | Freq: Three times a day (TID) | SUBCUTANEOUS | Status: DC
  Administered 2017-07-18: 3 [IU] via SUBCUTANEOUS
  Administered 2017-07-19: 5 [IU] via SUBCUTANEOUS
  Administered 2017-07-19: 11 [IU] via SUBCUTANEOUS
  Administered 2017-07-19: 15 [IU] via SUBCUTANEOUS
  Administered 2017-07-20: 11 [IU] via SUBCUTANEOUS
  Administered 2017-07-20: 4 [IU] via SUBCUTANEOUS
  Administered 2017-07-20: 5 [IU] via SUBCUTANEOUS
  Administered 2017-07-20: 4 [IU] via SUBCUTANEOUS
  Administered 2017-07-21: 8 [IU] via SUBCUTANEOUS
  Administered 2017-07-21: 15 [IU] via SUBCUTANEOUS
  Administered 2017-07-21: 11 [IU] via SUBCUTANEOUS
  Administered 2017-07-22: 8 [IU] via SUBCUTANEOUS
  Administered 2017-07-22: 5 [IU] via SUBCUTANEOUS

## 2017-07-18 MED ORDER — POTASSIUM CHLORIDE CRYS ER 20 MEQ PO TBCR
20.0000 meq | EXTENDED_RELEASE_TABLET | Freq: Two times a day (BID) | ORAL | Status: DC
  Administered 2017-07-18 – 2017-07-22 (×9): 20 meq via ORAL
  Filled 2017-07-18 (×10): qty 1

## 2017-07-18 MED ORDER — POTASSIUM CHLORIDE CRYS ER 20 MEQ PO TBCR
20.0000 meq | EXTENDED_RELEASE_TABLET | Freq: Once | ORAL | Status: AC
  Administered 2017-07-18: 20 meq via ORAL
  Filled 2017-07-18: qty 1

## 2017-07-18 MED ORDER — METOLAZONE 5 MG PO TABS
2.5000 mg | ORAL_TABLET | Freq: Every day | ORAL | Status: DC
  Administered 2017-07-18 – 2017-07-19 (×2): 2.5 mg via ORAL
  Filled 2017-07-18 (×2): qty 1

## 2017-07-18 MED ORDER — INSULIN GLARGINE 100 UNIT/ML ~~LOC~~ SOLN
20.0000 [IU] | Freq: Every day | SUBCUTANEOUS | Status: DC
  Administered 2017-07-18: 20 [IU] via SUBCUTANEOUS
  Filled 2017-07-18 (×2): qty 0.2

## 2017-07-18 MED ORDER — PROPRANOLOL HCL 20 MG PO TABS: 20.0000 mg | ORAL_TABLET | Freq: Every evening | ORAL | Status: DC | PRN

## 2017-07-18 MED ORDER — SPIRONOLACTONE 25 MG PO TABS
100.0000 mg | ORAL_TABLET | Freq: Every day | ORAL | Status: DC
  Administered 2017-07-18: 100 mg via ORAL
  Filled 2017-07-18: qty 4

## 2017-07-18 MED ORDER — SUCRALFATE 1 GM/10ML PO SUSP
1.0000 g | Freq: Three times a day (TID) | ORAL | Status: DC
  Administered 2017-07-18 – 2017-07-22 (×18): 1 g via ORAL
  Filled 2017-07-18 (×18): qty 10

## 2017-07-18 MED ORDER — TRAMADOL HCL 50 MG PO TABS
50.0000 mg | ORAL_TABLET | Freq: Three times a day (TID) | ORAL | Status: DC | PRN
  Administered 2017-07-18 – 2017-07-19 (×2): 50 mg via ORAL
  Filled 2017-07-18 (×2): qty 1

## 2017-07-18 MED ORDER — VITAMIN D 1000 UNITS PO TABS
1000.0000 [IU] | ORAL_TABLET | ORAL | Status: DC
  Administered 2017-07-18 – 2017-07-20 (×2): 1000 [IU] via ORAL
  Filled 2017-07-18 (×3): qty 1

## 2017-07-18 MED ORDER — PANTOPRAZOLE SODIUM 40 MG PO TBEC
40.0000 mg | DELAYED_RELEASE_TABLET | Freq: Every day | ORAL | Status: DC
  Administered 2017-07-18 – 2017-07-22 (×5): 40 mg via ORAL
  Filled 2017-07-18 (×5): qty 1

## 2017-07-18 NOTE — ED Provider Notes (Signed)
[facility] EMERGENCY DEPARTMENT Provider Note   CSN: [id number] Arrival date & time: 07/18/17  0209  Time seen 02:00 AM (On arrival)   History   Chief Complaint Chief Complaint  Patient presents with  . Altered Mental Status   Level 5 caveat for altered mental status  HPI Maria [name] is a 61 y.o. female.  HPI patient presents via EMS.  They report has been came home from work at 10 PM and patient seemed fine.  However shortly afterwards she started to become more confused.  They state they were called out for altered mental status.  They report in the ambulance in the last 5-10 minutes they thought she had some new left-sided weakness.  PCP Glenda Chroman, MD   Past Medical History:  Diagnosis Date  . Anemia   . CHF (congestive heart failure) ([place])   . Cirrhosis ([place])   . Depression   . Diabetes mellitus   . Esophageal bleed, non-variceal   . Eye hemorrhage    Behind left eye  . Fibromyalgia   . Hypercholesteremia   . Hypertension   . Hypothyroidism   . NAFLD (nonalcoholic fatty liver disease)   . Osteoarthrosis   . Osteoporosis   . PONV (postoperative nausea and vomiting)   . UTI (urinary tract infection) 11/12 end of the month   Patient feels that she passed a kidney stone at that time    Patient Active Problem List   Diagnosis Date Noted  . Acute on chronic right-sided congestive heart failure ([place]) 07/06/2017  . Pulmonary hypertension ([place]) 07/06/2017  . GAVE (gastric antral vascular ectasia) 07/06/2017  . GI bleeding 07/06/2017  . Symptomatic anemia 07/04/2017  . Hypokalemia 07/04/2017  . Chronic diastolic heart failure ([place]) 06/11/2017  . CAD (coronary artery disease) 06/11/2017  . Nocturnal hypoxemia 02/22/2017  . Idiopathic esophageal varices without bleeding ([place]) 01/31/2017  . Dyspnea and respiratory abnormalities 01/25/2017  . Esophageal varices in cirrhosis ([place]) 05/19/2016  . Hepatic cirrhosis ([place]) 05/19/2016  . Esophageal varices  ([place]) 12/09/2012  . Dysphagia, unspecified(787.20) 12/09/2012  . Anxiety 08/26/2012  . Cirrhosis, nonalcoholic ([place]) 16/09/930  . Diabetes mellitus ([place]) 10/09/2011  . History of esophageal varices 10/09/2011  . Iron deficiency anemia 10/09/2011  . GERD (gastroesophageal reflux disease) 10/09/2011  . Thrombocytopenia ([place]) 10/09/2011    Past Surgical History:  Procedure Laterality Date  . APPENDECTOMY  1980  . BILATERAL SALPINGOOPHORECTOMY    . CHOLECYSTECTOMY    . COLONOSCOPY  03/15/2011  . COLONOSCOPY N/A 03/24/2016   Procedure: COLONOSCOPY;  Surgeon: [name], MD;  [facility];  Service: Endoscopy;  Laterality: N/A;  855  . ESOPHAGEAL BANDING  04/24/2012   Procedure: ESOPHAGEAL BANDING;  Surgeon: [name], MD;  [facility];  Service: Endoscopy;  Laterality: N/A;  . Esophageal BANDING  08/03/2012   [facility] in [place] , [place]  09/24/2012   Procedure: ESOPHAGEAL BANDING;  Surgeon: [name], MD;  Location: [facility] ORS;  Service: Endoscopy;  Laterality: N/A;  Banding x 3  . ESOPHAGEAL BANDING N/A 01/29/2013   Procedure: ESOPHAGEAL BANDING;  Surgeon: [name], MD;  [facility];  Service: Endoscopy;  Laterality: N/A;  . ESOPHAGEAL BANDING N/A 06/19/2014   Procedure: ESOPHAGEAL BANDING;  Surgeon: [name], MD;  [facility];  Service: Endoscopy;  Laterality: N/A;  . ESOPHAGEAL BANDING N/A 01/05/2016   Procedure: ESOPHAGEAL BANDING;  Surgeon:  [name], MD;  [facility];  Service: Endoscopy;  Laterality: N/A;  . ESOPHAGEAL BANDING N/A 08/03/2016   Procedure: ESOPHAGEAL BANDING;  Surgeon: [name], MD;  [facility];  Service: Endoscopy;  Laterality: N/A;  . ESOPHAGEAL BANDING N/A 05/02/2017   Procedure: ESOPHAGEAL BANDING;  Surgeon: [name], MD;  [facility];  Service: Endoscopy;  Laterality: N/A;  . ESOPHAGOGASTRODUODENOSCOPY   04/24/2012   Procedure: ESOPHAGOGASTRODUODENOSCOPY (EGD);  Surgeon: [name], MD;  [facility];  Service: Endoscopy;  Laterality: N/A;  300  . ESOPHAGOGASTRODUODENOSCOPY N/A 01/29/2013   Procedure: ESOPHAGOGASTRODUODENOSCOPY (EGD);  Surgeon: [name], MD;  [facility];  Service: Endoscopy;  Laterality: N/A;  1200  . ESOPHAGOGASTRODUODENOSCOPY N/A 05/22/2013   Procedure: ESOPHAGOGASTRODUODENOSCOPY (EGD);  Surgeon: [name], MD;  [facility];  Service: Endoscopy;  Laterality: N/A;  1:55  . ESOPHAGOGASTRODUODENOSCOPY N/A 12/31/2013   Procedure: ESOPHAGOGASTRODUODENOSCOPY (EGD);  Surgeon: [name], MD;  [facility];  Service: Endoscopy;  Laterality: N/A;  1200  . ESOPHAGOGASTRODUODENOSCOPY N/A 06/19/2014   Procedure: ESOPHAGOGASTRODUODENOSCOPY (EGD);  Surgeon: [name], MD;  [facility];  Service: Endoscopy;  Laterality: N/A;  1055  . ESOPHAGOGASTRODUODENOSCOPY N/A 01/15/2015   Procedure: ESOPHAGOGASTRODUODENOSCOPY (EGD);  Surgeon: [name], MD;  [facility];  Service: Endoscopy;  Laterality: N/A;  730 - moved to 9:45 - moved to 1250-Ann notified pt  . ESOPHAGOGASTRODUODENOSCOPY N/A 06/30/2015   Procedure: ESOPHAGOGASTRODUODENOSCOPY (EGD);  Surgeon: [name], MD;  [facility];  Service: Endoscopy;  Laterality: N/A;  1200  . ESOPHAGOGASTRODUODENOSCOPY N/A 01/05/2016   Procedure: ESOPHAGOGASTRODUODENOSCOPY (EGD);  Surgeon: [name], MD;  [facility];  Service: Endoscopy;  Laterality: N/A;  830  . ESOPHAGOGASTRODUODENOSCOPY N/A 08/03/2016   Procedure: ESOPHAGOGASTRODUODENOSCOPY (EGD);  Surgeon: [name], MD;  [facility];  Service: Endoscopy;  Laterality: N/A;  930  . ESOPHAGOGASTRODUODENOSCOPY N/A 05/02/2017   Procedure: ESOPHAGOGASTRODUODENOSCOPY (EGD);  Surgeon: [name], MD;  [facility];  Service: Endoscopy;  Laterality:  N/A;  12:00  . ESOPHAGOGASTRODUODENOSCOPY (EGD) WITH PROPOFOL  09/24/2012   Procedure: ESOPHAGOGASTRODUODENOSCOPY (EGD) WITH PROPOFOL;  Surgeon: [name], MD;  Location: [facility];  Service: Endoscopy;  Laterality: N/A;  GE junction at 36  . ESOPHAGOGASTRODUODENOSCOPY (EGD) WITH PROPOFOL N/A 07/06/2017   Procedure: ESOPHAGOGASTRODUODENOSCOPY (EGD) WITH PROPOFOL;  Surgeon: [name], MD;  [facility];  Service: Endoscopy;  Laterality: N/A;  . ESOPHAGOGASTRODUODENOSCOPY W/ BANDING  08/2010  . GIVENS CAPSULE STUDY N/A 07/05/2017   Procedure: GIVENS CAPSULE STUDY;  Surgeon: [name], MD;  [facility];  Service: Endoscopy;  Laterality: N/A;  . POLYPECTOMY  03/24/2016   Procedure: POLYPECTOMY;  Surgeon: [name], MD;  [facility];  Service: Endoscopy;;  sigmoid polyp  . RIGHT HEART CATH N/A 04/16/2017   Procedure: Right Heart Cath;  Surgeon: [name], MD;  Location: [place] [facility];  Service: Cardiovascular;  Laterality: N/A;  . RIGHT HEART CATH N/A 07/12/2017   Procedure: RIGHT HEART CATH;  Surgeon: [name], MD;  Location: [place] [facility];  Service: Cardiovascular;  Laterality: N/A;  . TONSILLECTOMY    . UPPER GASTROINTESTINAL ENDOSCOPY  03/15/2011   EGD ED BANDING/TCS  . UPPER GASTROINTESTINAL ENDOSCOPY  09/05/2010  . UPPER GASTROINTESTINAL ENDOSCOPY  08/11/2010  . VAGINAL  HYSTERECTOMY      OB History    No data available       Home Medications    Prior to Admission medications   Medication Sig Start Date End Date Taking? Authorizing Provider  cholecalciferol (VITAMIN D) 1000 UNITS tablet Take 1,000 Units by mouth every Monday, Wednesday, and Friday.     [provider]  ferrous sulfate 325 (65 FE) MG tablet Take 325 mg by mouth 2 (two) times daily with a meal.    [provider]  flintstones complete (FLINTSTONES) 60 MG chewable tablet Chew 1 tablet by mouth daily.    [provider]  fluticasone furoate-vilanterol (BREO ELLIPTA) 200-25 MCG/INH AEPB Inhale 1 puff into the lungs daily. 02/22/17   Magdalen Spatz, NP  Insulin Human (INSULIN PUMP) SOLN Inject into the skin daily.    [provider]  insulin lispro (HUMALOG) 100 UNIT/ML injection Inject into the skin 3 (three) times daily before meals. RATE AS FOLLOWS: 1200-0300 3.55 UNITS/HR, 0300-0830=3.70 UNITS/HR, 0830-1430=5.05 UNITS/HR, 1430-1900=5.10 UNITS/HR, & 1900-2359=4.65 UNITS/HR. Patient may bolus as needed with meals.    [provider]  levothyroxine (SYNTHROID, LEVOTHROID) 25 MCG tablet Take 25 mcg by mouth daily before breakfast.     [provider]  metolazone (ZAROXOLYN) 2.5 MG tablet Take 1 tablet (2.5 mg total) by mouth as directed. Call CHF clinic [phone]) before taking. 07/04/17 10/02/17  Shirley Friar, PA-C  OXYGEN Inhale 2 L into the lungs every evening. WITH SLEEP.    [provider]  pantoprazole (PROTONIX) 40 MG tablet Take 1 tablet by mouth once daily 02/06/17   Rehman, Mechele Dawley, MD  PARoxetine (PAXIL) 40 MG tablet Take 40 mg by mouth every morning.    [provider]  potassium chloride SA (K-DUR,KLOR-CON) 20 MEQ tablet Take 20 mEq by mouth 2 (two) times daily.    [provider]  propranolol (INDERAL) 20 MG tablet Take 1 tablet (20 mg total) by mouth at bedtime as needed (for increased BP). 07/10/17   Kathie Dike, MD  rosuvastatin (CRESTOR) 5 MG tablet Take 1 tablet (5 mg total) by mouth every other day. 06/27/17 09/25/17  [name], MD  spironolactone (ALDACTONE) 100 MG tablet Take 1 tablet (100 mg total) by mouth at bedtime. 07/10/17 07/10/18  Kathie Dike, MD  sucralfate (CARAFATE) 1 GM/10ML suspension Take 10 mLs (1 g total) by mouth 4 (four) times daily -  with meals and at bedtime. 07/10/17   Kathie Dike, MD  torsemide (DEMADEX) 20 MG tablet Take 3 tablets (60 mg total) by mouth daily. 07/04/17 10/02/17  Shirley Friar, PA-C    Family History Family History  Problem Relation Age of Onset  . Lung cancer Mother   . Diabetes Father   . Diabetes Sister   . Hypertension Sister   . Hypothyroidism Sister   . Colon cancer Brother   . Diabetes Sister   . Hypothyroidism Brother   . Healthy Daughter   . Obesity Daughter   . Hypertension Daughter     Social History Social History  Substance Use Topics  . Smoking status: Never Smoker  . Smokeless tobacco: Never Used  . Alcohol use No  lives at home Lives with spouse   Allergies   Feraheme [ferumoxytol]   Review of Systems Review of Systems  Unable to perform ROS: Mental status change     Physical Exam Updated Vital Signs BP (!) 85/73 (BP Location: Left Arm)   Pulse  74   Temp (!) 97.1 F (36.2 C)   Resp 16   Wt 102.1 kg (225 lb)   SpO2 97%   BMI 39.86 kg/m   Physical Exam  Constitutional: She appears well-developed and well-nourished.  Non-toxic appearance. She does not appear ill. No distress.  HENT:  Head: Normocephalic and atraumatic.  Right Ear: External ear normal.  Left Ear: External ear normal.  Nose: Nose normal. No mucosal edema or rhinorrhea.  Mouth/Throat: Oropharynx is clear and moist and mucous membranes are normal. No dental abscesses or uvula swelling.  Eyes: Pupils are equal, round, and reactive to light. Conjunctivae and EOM are normal.  Neck: Normal range of motion and full passive range of motion without pain. Neck supple.  Cardiovascular: Normal rate, regular rhythm and normal heart sounds.  Exam reveals no gallop and no friction rub.   No murmur heard. Pulmonary/Chest: Effort normal and breath sounds normal. No respiratory distress. She has no wheezes. She has no rhonchi. She has no rales. She exhibits no tenderness and no crepitus.  Abdominal: Soft. Normal appearance and bowel sounds are normal. She exhibits no distension. There is no tenderness. There is no rebound and no guarding.    Musculoskeletal: Normal range of motion. She exhibits no edema or tenderness.  Moves all extremities well.   Neurological: She is alert. She has normal strength. No cranial nerve deficit.  On my exam patient is very slow to respond.  She seems to have difficulty following commands.  Her grips are minimally weak on the left, she spontaneously can raise her right arm towards the ceiling however if I lift up her left arm and pointed to the ceiling she is able to maintain it.  She has no facial asymmetry.  She can move both lower extremities.  Skin: Skin is warm, dry and intact. No rash noted. No erythema. No pallor.  Psychiatric: She has a normal mood and affect. Her mood appears not anxious. Her speech is slurred. She is slowed.  Nursing note and vitals reviewed.    ED Treatments / Results  Labs (all labs ordered are listed, but only abnormal results are displayed)  Results for orders placed or performed during the hospital encounter of 07/18/17  Ethanol  Result Value Ref Range   Alcohol, Ethyl (B) <10 <10 mg/dL  Protime-INR  Result Value Ref Range   Prothrombin Time 16.4 (H) 11.4 - 15.2 seconds   INR 1.33   APTT  Result Value Ref Range   aPTT 32 24 - 36 seconds  CBC  Result Value Ref Range   WBC 7.7 4.0 - 10.5 K/uL   RBC 3.43 (L) 3.87 - 5.11 MIL/uL   Hemoglobin 9.7 (L) 12.0 - 15.0 g/dL   HCT 31.3 (L) 36.0 - 46.0 %   MCV 91.3 78.0 - 100.0 fL   MCH 28.3 26.0 - 34.0 pg   MCHC 31.0 30.0 - 36.0 g/dL   RDW 26.8 (H) 11.5 - 15.5 %   Platelets 67 (L) 150 - 400 K/uL  Differential  Result Value Ref Range   Neutrophils Relative % 72 %   Lymphocytes Relative 17 %   Monocytes Relative 9 %   Eosinophils Relative 1 %   Basophils Relative 1 %   Neutro Abs 5.5 1.7 - 7.7 K/uL   Lymphs Abs 1.3 0.7 - 4.0 K/uL   Monocytes Absolute 0.7 0.1 - 1.0 K/uL   Eosinophils Absolute 0.1 0.0 - 0.7 K/uL   Basophils Absolute 0.1 0.0 - 0.1  K/uL   RBC Morphology ANISOCYTES   Comprehensive metabolic panel   Result Value Ref Range   Sodium 138 135 - 145 mmol/L   Potassium 3.4 (L) 3.5 - 5.1 mmol/L   Chloride 102 101 - 111 mmol/L   CO2 27 22 - 32 mmol/L   Glucose, Bld 135 (H) 65 - 99 mg/dL   BUN 19 6 - 20 mg/dL   Creatinine, Ser 1.07 (H) 0.44 - 1.00 mg/dL   Calcium 8.6 (L) 8.9 - 10.3 mg/dL   Total Protein 6.4 (L) 6.5 - 8.1 g/dL   Albumin 2.9 (L) 3.5 - 5.0 g/dL   AST 32 15 - 41 U/L   ALT 20 14 - 54 U/L   Alkaline Phosphatase 110 38 - 126 U/L   Total Bilirubin 1.6 (H) 0.3 - 1.2 mg/dL   GFR calc non Af Amer 54 (L) >60 mL/min   GFR calc Af Amer >60 >60 mL/min   Anion gap 9 5 - 15  Urine rapid drug screen (hosp performed)  Result Value Ref Range   Opiates NONE DETECTED NONE DETECTED   Cocaine NONE DETECTED NONE DETECTED   Benzodiazepines NONE DETECTED NONE DETECTED   Amphetamines NONE DETECTED NONE DETECTED   Tetrahydrocannabinol NONE DETECTED NONE DETECTED   Barbiturates NONE DETECTED NONE DETECTED  Urinalysis, Routine w reflex microscopic  Result Value Ref Range   Color, Urine YELLOW YELLOW   APPearance CLEAR CLEAR   Specific Gravity, Urine 1.016 1.005 - 1.030   pH 5.0 5.0 - 8.0   Glucose, UA NEGATIVE NEGATIVE mg/dL   Hgb urine dipstick NEGATIVE NEGATIVE   Bilirubin Urine NEGATIVE NEGATIVE   Ketones, ur NEGATIVE NEGATIVE mg/dL   Protein, ur NEGATIVE NEGATIVE mg/dL   Nitrite NEGATIVE NEGATIVE   Leukocytes, UA NEGATIVE NEGATIVE  Troponin I  Result Value Ref Range   Troponin I <0.03 <0.03 ng/mL  Ammonia  Result Value Ref Range   Ammonia 85 (H) 9 - 35 umol/L    Laboratory interpretation all normal except elevated ammonia, stable anemia     EKG  EKG Interpretation  Date/Time:  Wednesday July 18 2017 02:22:35 EDT Ventricular Rate:  78 PR Interval:    QRS Duration: 108 QT Interval:  435 QTC Calculation: 496 R Axis:   -82 Text Interpretation:  Sinus or ectopic atrial rhythm Atrial premature complex Prolonged PR interval Left anterior fascicular block Consider  anterior infarct Electrode noise No significant change since last tracing 12 Jul 2017 Confirmed by Rolland Porter [id number]) on 07/18/2017 2:37:01 AM       Radiology Ct Head Wo Contrast  Result Date: 07/18/2017 CLINICAL DATA:  Altered level of consciousness. EXAM: CT HEAD WITHOUT CONTRAST TECHNIQUE: Contiguous axial images were obtained from the base of the skull through the vertex without intravenous contrast. COMPARISON:  None. FINDINGS: Brain: Equivocal loss of gray-white differentiation in the right temporal lobe versus artifact. New acute hemorrhage. No hydrocephalus. No midline shift or mass effect. Vascular: Atherosclerosis of skullbase vasculature without hyperdense vessel or abnormal calcification. Skull: No fracture or focal lesion. Sinuses/Orbits: Near complete opacification of the left maxillary sinus, left sphenoid sinus, moderate left ethmoid air cell opacification and fluid level in the left frontal sinus. Minimal opacification of left mastoid air cells. Visualized orbits are unremarkable. Other: None. IMPRESSION: 1. Equivocal loss of gray-white differentiation in the right temporal lobe which can be seen with acute ischemia, recommend MRI for further evaluation. 2. Left paranasal sinus disease with near complete opacification of the maxillary sinus, sphenoid sinus,  and mucosal thickening of the frontal sinus and ethmoid air cells. Electronically Signed   By: Jeb Levering M.D.   On: 07/18/2017 02:33     US Abdomen Complete  Result Date: 07/06/2017 CLINICAL DATA:  [name] EXAM: ABDOMEN ULTRASOUND COMPLETE COMPARISON:  11/24/2016 IMPRESSION: Heterogeneous echotexture throughout the liver with slightly nodular contours compatible with cirrhosis. Small amount of perihepatic ascites. Splenomegaly. Electronically Signed   By: Rolm Baptise M.D.   On: 07/06/2017 09:48   US Liver Doppler  Result Date: 07/06/2017 CLINICAL DATA:  Nonalcoholic hepatic steatosis  IMPRESSION: Hepatic and portal veins  are patent with normal directionality of flow. Ascites is present about the liver. Electronically Signed   By: Marybelle Killings M.D.   On: 07/06/2017 09:50    Procedures .Critical Care Performed by: Tomi Bamberger, Nickie Deren Authorized by: Rolland Porter   Critical care provider statement:    Critical care time (minutes):  32   Critical care was necessary to treat or prevent imminent or life-threatening deterioration of the following conditions:  CNS failure or compromise   Critical care was time spent personally by me on the following activities:  Discussions with consultants, examination of patient, ordering and performing treatments and interventions, ordering and review of laboratory studies, ordering and review of radiographic studies and re-evaluation of patient's condition   (including critical care time)  Medications Ordered in ED Medications - No data to display   Initial Impression / Assessment and Plan / ED Course  I have reviewed the triage vital signs and the nursing notes.  Pertinent labs & imaging results that were available during my care of the patient were reviewed by me and considered in my medical decision making (see chart for details).    Patient was taken immediately to CT scan, code stroke was not called since she does not have a focal weakness on my exam.  Code stroke orders were started.  I suspect patient may have hepatic encephalopathy or she could have some other acute intracranial event.  2:32 AM radiologist called her CT report, she sees a questionable abnormality in the right temporal lobe.  Tele-neurology consult was ordered.  3:35 AM the specialist on-call neurologist is examining the patient.  He has reviewed her CT scan and agrees she needs an MRI but feels it can be done at 7 AM when MRI arrives here.  He does not feel like it needs to be done emergently.  He feels that her exam is most consistent with a metabolic problem which would be explained by her elevated ammonia  level.  At this time patient is still slow to follow commands however she is able to do pronator drift testing which is negative.  She is noted to have asterixis.  04:15 AM Dr Olevia Bowens, hospitalist, will admit  Final Clinical Impressions(s) / ED Diagnoses   Final diagnoses:  Altered mental status, unspecified altered mental status type  Hepatic encephalopathy [facility])    Plan admission  Rolland Porter, MD, Barbette Or, MD 07/18/17 [phone]

## 2017-07-18 NOTE — ED Triage Notes (Signed)
Pt arrived via [facility] for Altered mental status.

## 2017-07-18 NOTE — Progress Notes (Signed)
Patient seen and examined, database reviewed.  Discussed with husband and daughter at bedside.  She was admitted earlier today due to confusion.  Has a history of cirrhosis and was found to have an elevated ammonia level consistent with hepatic encephalopathy.  Her ammonia level is decreased to 85 today, husband states there has been some improvement but she remains confused and "sluggish".  Plan to continue lactulose.  I note that she is still on her insulin pump, given her encephalopathy and inability to appropriately control her pump, will discontinue and place on Lantus and sliding scale.  I am unable to review what her basal rate on her pump is.  She was not on lactulose prior to this admission.  We will continue to follow.  Domingo Mend, MD [facility] Pager: [phone]

## 2017-07-18 NOTE — H&P (Signed)
History and Physical    Maria [name] ZDG:[id number] DOB: May 23, 1956 DOA: 07/18/2017  PCP: Glenda Chroman, MD   Patient coming from: Home  I have personally briefly reviewed patient's old medical records in [place]  Chief Complaint: Altered mental status.  HPI: Maria [name] is a 61 y.o. female with medical history significant of anemia, chronic diastolic CHF, non alcoholic liver cirrhosis, anxiety, depression, esophageal varices, history of number initial esophageal bleed, eye hemorrhage, fibromyalgia, hyperlipidemia, hypertension, hypothyroidism, osteoarthritis, osteoporosis who was brought to the emergency department via EMS due to altered mental status. Her husband stated that she has been somnolent most of the day, but had a very difficult time being awakened yesterday late in the evening so he subsequently called EMS. The patient is more easily arousable now. She denies headache, chest pain, dizziness, palpitations, abdominal pain, nausea, emesis, diarrhea, melena or hematochezia. No dysuria, no frequency, oliguria or hematuria. Denies polyuria, polydipsia or blurred vision.  ED Course: Initial vital signs temperature 97.72F, pulse 74, respirations 16 and blood pressure 8573 and O2 sat 97% on room air. She was evaluated by telephone neurologist along with Dr. Tomi Bamberger. No focal deficits were seen. MRI of the head was recommended given the questionable finding on right temporal lobe on CT scan of the head. Please see images and full radiology report for further detail.  Her workup showed normal white count, hemoglobin of 9.7 g/dL and platelets of 67. PT was 16.4, INR 133 and PTT 32 seconds. Her CMP showed a potassium of 3.4 mmol/L, glucose of 135 mg/dL, albumin 2.9 g/dL and bilirubin of 1.6 mg/dL. Her ammonia level was 85. EtOH level was negative. Urinalysis and U tox were negative.  Review of Systems: As per HPI otherwise 10 point review of systems negative.    Past Medical  History:  Diagnosis Date  . Anemia   . CHF (congestive heart failure) ([place])   . Cirrhosis ([place])   . Depression   . Diabetes mellitus   . Esophageal bleed, non-variceal   . Eye hemorrhage    Behind left eye  . Fibromyalgia   . Hypercholesteremia   . Hypertension   . Hypothyroidism   . NAFLD (nonalcoholic fatty liver disease)   . Osteoarthrosis   . Osteoporosis   . PONV (postoperative nausea and vomiting)   . UTI (urinary tract infection) 11/12 end of the month   Patient feels that she passed a kidney stone at that time    Past Surgical History:  Procedure Laterality Date  . APPENDECTOMY  1980  . BILATERAL SALPINGOOPHORECTOMY    . CHOLECYSTECTOMY    . COLONOSCOPY  03/15/2011  . COLONOSCOPY N/A 03/24/2016   Procedure: COLONOSCOPY;  Surgeon: [name], MD;  [facility];  Service: Endoscopy;  Laterality: N/A;  855  . ESOPHAGEAL BANDING  04/24/2012   Procedure: ESOPHAGEAL BANDING;  Surgeon: [name], MD;  [facility];  Service: Endoscopy;  Laterality: N/A;  . Esophageal BANDING  08/03/2012   [facility] in [place] , [place]  09/24/2012   Procedure: ESOPHAGEAL BANDING;  Surgeon: [name], MD;  [facility]: [facility];  Service: Endoscopy;  Laterality: N/A;  Banding x 3  . ESOPHAGEAL BANDING N/A 01/29/2013   Procedure: ESOPHAGEAL BANDING;  Surgeon: [name], MD;  [facility];  Service: Endoscopy;  Laterality: N/A;  . ESOPHAGEAL BANDING N/A 06/19/2014   Procedure: ESOPHAGEAL BANDING;  Surgeon: Mechele Dawley  Laural Golden, MD;  [facility];  Service: Endoscopy;  Laterality: N/A;  . ESOPHAGEAL BANDING N/A 01/05/2016   Procedure: ESOPHAGEAL BANDING;  Surgeon: [name], MD;  [facility];  Service: Endoscopy;  Laterality: N/A;  . ESOPHAGEAL BANDING N/A 08/03/2016   Procedure: ESOPHAGEAL BANDING;  Surgeon: [name], MD;  [facility];  Service: Endoscopy;  Laterality: N/A;    . ESOPHAGEAL BANDING N/A 05/02/2017   Procedure: ESOPHAGEAL BANDING;  Surgeon: [name], MD;  [facility];  Service: Endoscopy;  Laterality: N/A;  . ESOPHAGOGASTRODUODENOSCOPY  04/24/2012   Procedure: ESOPHAGOGASTRODUODENOSCOPY (EGD);  Surgeon: [name], MD;  [facility];  Service: Endoscopy;  Laterality: N/A;  300  . ESOPHAGOGASTRODUODENOSCOPY N/A 01/29/2013   Procedure: ESOPHAGOGASTRODUODENOSCOPY (EGD);  Surgeon: [name], MD;  [facility];  Service: Endoscopy;  Laterality: N/A;  1200  . ESOPHAGOGASTRODUODENOSCOPY N/A 05/22/2013   Procedure: ESOPHAGOGASTRODUODENOSCOPY (EGD);  Surgeon: [name], MD;  [facility];  Service: Endoscopy;  Laterality: N/A;  1:55  . ESOPHAGOGASTRODUODENOSCOPY N/A 12/31/2013   Procedure: ESOPHAGOGASTRODUODENOSCOPY (EGD);  Surgeon: [name], MD;  [facility];  Service: Endoscopy;  Laterality: N/A;  1200  . ESOPHAGOGASTRODUODENOSCOPY N/A 06/19/2014   Procedure: ESOPHAGOGASTRODUODENOSCOPY (EGD);  Surgeon: [name], MD;  [facility];  Service: Endoscopy;  Laterality: N/A;  1055  . ESOPHAGOGASTRODUODENOSCOPY N/A 01/15/2015   Procedure: ESOPHAGOGASTRODUODENOSCOPY (EGD);  Surgeon: [name], MD;  [facility];  Service: Endoscopy;  Laterality: N/A;  730 - moved to 9:45 - moved to 1250-Ann notified pt  . ESOPHAGOGASTRODUODENOSCOPY N/A 06/30/2015   Procedure: ESOPHAGOGASTRODUODENOSCOPY (EGD);  Surgeon: [name], MD;  [facility];  Service: Endoscopy;  Laterality: N/A;  1200  . ESOPHAGOGASTRODUODENOSCOPY N/A 01/05/2016   Procedure: ESOPHAGOGASTRODUODENOSCOPY (EGD);  Surgeon: [name], MD;  [facility];  Service: Endoscopy;  Laterality: N/A;  830  . ESOPHAGOGASTRODUODENOSCOPY N/A 08/03/2016   Procedure: ESOPHAGOGASTRODUODENOSCOPY (EGD);  Surgeon: [name], MD;  [facility];  Service: Endoscopy;   Laterality: N/A;  930  . ESOPHAGOGASTRODUODENOSCOPY N/A 05/02/2017   Procedure: ESOPHAGOGASTRODUODENOSCOPY (EGD);  Surgeon: [name], MD;  [facility];  Service: Endoscopy;  Laterality: N/A;  12:00  . ESOPHAGOGASTRODUODENOSCOPY (EGD) WITH PROPOFOL  09/24/2012   Procedure: ESOPHAGOGASTRODUODENOSCOPY (EGD) WITH PROPOFOL;  Surgeon: [name], MD;  [facility];  Service: Endoscopy;  Laterality: N/A;  GE junction at 36  . ESOPHAGOGASTRODUODENOSCOPY (EGD) WITH PROPOFOL N/A 07/06/2017   Procedure: ESOPHAGOGASTRODUODENOSCOPY (EGD) WITH PROPOFOL;  Surgeon: [name], MD;  [facility];  Service: Endoscopy;  Laterality: N/A;  . ESOPHAGOGASTRODUODENOSCOPY W/ BANDING  08/2010  . GIVENS CAPSULE STUDY N/A 07/05/2017   Procedure: GIVENS CAPSULE STUDY;  Surgeon: [name], MD;  [facility];  Service: Endoscopy;  Laterality: N/A;  . POLYPECTOMY  03/24/2016   Procedure: POLYPECTOMY;  Surgeon: [name], MD;  [facility];  Service: Endoscopy;;  sigmoid polyp  . RIGHT HEART CATH N/A 04/16/2017   Procedure: Right Heart Cath;  Surgeon: [name], MD;  Location: [facility];  Service: Cardiovascular;  Laterality: N/A;  . RIGHT HEART CATH N/A 07/12/2017   Procedure: RIGHT HEART CATH;  Surgeon: [name], MD;  Location: [facility];  Service: Cardiovascular;  Laterality: N/A;  . TONSILLECTOMY    .  UPPER GASTROINTESTINAL ENDOSCOPY  03/15/2011   EGD ED BANDING/TCS  . UPPER GASTROINTESTINAL ENDOSCOPY  09/05/2010  . UPPER GASTROINTESTINAL ENDOSCOPY  08/11/2010  . VAGINAL HYSTERECTOMY       reports that she has never smoked. She has never used smokeless tobacco. She reports that she does not drink alcohol or use drugs.  Allergies  Allergen Reactions  . Feraheme [Ferumoxytol]     Patient has severe back and chest pain when receiving FERAHEME infusions.    Family History  Problem Relation Age of Onset  .  Lung cancer Mother   . Diabetes Father   . Diabetes Sister   . Hypertension Sister   . Hypothyroidism Sister   . Colon cancer Brother   . Diabetes Sister   . Hypothyroidism Brother   . Healthy Daughter   . Obesity Daughter   . Hypertension Daughter     Prior to Admission medications   Medication Sig Start Date End Date Taking? Authorizing Provider  cholecalciferol (VITAMIN D) 1000 UNITS tablet Take 1,000 Units by mouth every Monday, Wednesday, and Friday.    Yes [provider]  ferrous sulfate 325 (65 FE) MG tablet Take 325 mg by mouth 2 (two) times daily with a meal.   Yes [provider]  flintstones complete (FLINTSTONES) 60 MG chewable tablet Chew 1 tablet by mouth daily.   Yes [provider]  fluticasone furoate-vilanterol (BREO ELLIPTA) 200-25 MCG/INH AEPB Inhale 1 puff into the lungs daily. 02/22/17  Yes Magdalen Spatz, NP  Insulin Human (INSULIN PUMP) SOLN Inject into the skin daily.   Yes [provider]  insulin lispro (HUMALOG) 100 UNIT/ML injection Inject into the skin 3 (three) times daily before meals. RATE AS FOLLOWS: 1200-0300 3.55 UNITS/HR, 0300-0830=3.70 UNITS/HR, 0830-1430=5.05 UNITS/HR, 1430-1900=5.10 UNITS/HR, & 1900-2359=4.65 UNITS/HR. Patient may bolus as needed with meals.   Yes [provider]  levothyroxine (SYNTHROID, LEVOTHROID) 25 MCG tablet Take 25 mcg by mouth daily before breakfast.    Yes [provider]  metolazone (ZAROXOLYN) 2.5 MG tablet Take 1 tablet (2.5 mg total) by mouth as directed. Call CHF clinic [phone]) before taking. 07/04/17 10/02/17 Yes TillerySatira Mccallum, PA-C  OXYGEN Inhale 2 L into the lungs every evening. WITH SLEEP.   Yes [provider]  pantoprazole (PROTONIX) 40 MG tablet Take 1 tablet by mouth once daily 02/06/17  Yes Rehman, Najeeb U, MD  PARoxetine (PAXIL) 40 MG tablet Take 40 mg by mouth every morning.   Yes [provider]  potassium chloride SA  (K-DUR,KLOR-CON) 20 MEQ tablet Take 20 mEq by mouth 2 (two) times daily.   Yes [provider]  propranolol (INDERAL) 20 MG tablet Take 1 tablet (20 mg total) by mouth at bedtime as needed (for increased BP). 07/10/17  Yes Kathie Dike, MD  rosuvastatin (CRESTOR) 5 MG tablet Take 1 tablet (5 mg total) by mouth every other day. 06/27/17 09/25/17 Yes [name], MD  spironolactone (ALDACTONE) 100 MG tablet Take 1 tablet (100 mg total) by mouth at bedtime. 07/10/17 07/10/18 Yes Kathie Dike, MD  sucralfate (CARAFATE) 1 GM/10ML suspension Take 10 mLs (1 g total) by mouth 4 (four) times daily -  with meals and at bedtime. 07/10/17  Yes Kathie Dike, MD  torsemide (DEMADEX) 20 MG tablet Take 3 tablets (60 mg total) by mouth daily. 07/04/17 10/02/17 Yes Shirley Friar, PA-C    Physical Exam: Vitals:   07/18/17 0355 07/18/17 0400 07/18/17 0416 07/18/17 0538  BP:  Marland Kitchen)  111/53  (!) 140/40  Pulse:  73  81  Resp:  17  20  Temp: (!) 97.1 F (36.2 C)  (!) 97.4 F (36.3 C) 98.4 F (36.9 C)  TempSrc:    Oral  SpO2:  100%  96%  Weight:    99 kg (218 lb 3.2 oz)  Height:    5' 3"  (1.6 m)    Constitutional: Somnolent, but in NAD, calm, comfortable Eyes: PERRL, lids and conjunctivae normal. No icterus. ENMT: Mucous membranes are moist. Posterior pharynx clear of any exudate or lesions. Neck: normal, supple, no masses, no thyromegaly Respiratory: clear to auscultation bilaterally, no wheezing, no crackles. Normal respiratory effort. No accessory muscle use.  Cardiovascular: Regular rate and rhythm, no murmurs / rubs / gallops. No extremity edema. 2+ pedal pulses. No carotid bruits.  Abdomen: Obese, soft, no tenderness, no masses palpated. No hepatosplenomegaly. Bowel sounds positive.  Musculoskeletal: no clubbing / cyanosis. Good ROM, no contractures. Normal muscle tone.  Skin: Ecchymoses on upper extremities from previous venipuncture. Lower extremity skin looks wrinkled from  recently treated lower extremity edema. Neurologic: CN 2-12 grossly intact. Sensation intact, DTR normal. Strength 5/5 in all 4.  Psychiatric: Somnolent, but wakes up and oriented 4.   Labs on Admission: I have personally reviewed following labs and imaging studies  CBC:  Recent Labs Lab 07/18/17 0241  WBC 7.7  NEUTROABS 5.5  HGB 9.7*  HCT 31.3*  MCV 91.3  PLT 67*   Basic Metabolic Panel:  Recent Labs Lab 07/18/17 0241  NA 138  K 3.4*  CL 102  CO2 27  GLUCOSE 135*  BUN 19  CREATININE 1.07*  CALCIUM 8.6*  MG 1.9   GFR: Estimated Creatinine Clearance: 61.1 mL/min (A) (by C-G formula based on SCr of 1.07 mg/dL (H)). Liver Function Tests:  Recent Labs Lab 07/18/17 0241  AST 32  ALT 20  ALKPHOS 110  BILITOT 1.6*  PROT 6.4*  ALBUMIN 2.9*   No results for input(s): LIPASE, AMYLASE in the last 168 hours.  Recent Labs Lab 07/18/17 0241  AMMONIA 85*   Coagulation Profile:  Recent Labs Lab 07/18/17 0241  INR 1.33   Cardiac Enzymes:  Recent Labs Lab 07/18/17 0241  TROPONINI <0.03   BNP (last 3 results) No results for input(s): PROBNP in the last 8760 hours. HbA1C: No results for input(s): HGBA1C in the last 72 hours. CBG:  Recent Labs Lab 07/12/17 0722 07/12/17 0956  GLUCAP 160* 159*   Lipid Profile: No results for input(s): CHOL, HDL, LDLCALC, TRIG, CHOLHDL, LDLDIRECT in the last 72 hours. Thyroid Function Tests: No results for input(s): TSH, T4TOTAL, FREET4, T3FREE, THYROIDAB in the last 72 hours. Anemia Panel: No results for input(s): VITAMINB12, FOLATE, FERRITIN, TIBC, IRON, RETICCTPCT in the last 72 hours. Urine analysis:    Component Value Date/Time   COLORURINE YELLOW 07/18/2017 0300   APPEARANCEUR CLEAR 07/18/2017 0300   LABSPEC 1.016 07/18/2017 0300   PHURINE 5.0 07/18/2017 0300   GLUCOSEU NEGATIVE 07/18/2017 0300   HGBUR NEGATIVE 07/18/2017 0300   BILIRUBINUR NEGATIVE 07/18/2017 0300   KETONESUR NEGATIVE 07/18/2017 0300     PROTEINUR NEGATIVE 07/18/2017 0300   NITRITE NEGATIVE 07/18/2017 0300   LEUKOCYTESUR NEGATIVE 07/18/2017 0300    Radiological Exams on Admission: Ct Head Wo Contrast  Result Date: 07/18/2017 CLINICAL DATA:  Altered level of consciousness. EXAM: CT HEAD WITHOUT CONTRAST TECHNIQUE: Contiguous axial images were obtained from the base of the skull through the vertex without intravenous contrast. COMPARISON:  None.  FINDINGS: Brain: Equivocal loss of gray-white differentiation in the right temporal lobe versus artifact. New acute hemorrhage. No hydrocephalus. No midline shift or mass effect. Vascular: Atherosclerosis of skullbase vasculature without hyperdense vessel or abnormal calcification. Skull: No fracture or focal lesion. Sinuses/Orbits: Near complete opacification of the left maxillary sinus, left sphenoid sinus, moderate left ethmoid air cell opacification and fluid level in the left frontal sinus. Minimal opacification of left mastoid air cells. Visualized orbits are unremarkable. Other: None. IMPRESSION: 1. Equivocal loss of gray-white differentiation in the right temporal lobe which can be seen with acute ischemia, recommend MRI for further evaluation. 2. Left paranasal sinus disease with near complete opacification of the maxillary sinus, sphenoid sinus, and mucosal thickening of the frontal sinus and ethmoid air cells. Electronically Signed   By: Jeb Levering M.D.   On: 07/18/2017 02:33    EKG: Independently reviewed.  Assessment/Plan Principal Problem:   Hyperammonemia (HCC)   Hepatic cirrhosis (HCC) The patient is unable to tell me what she has eaten lately. H&H seems to be a stable. Observation/telemetry. Start lactulose 20 g by mouth 3 times a day. Follow-up ammonia level. No signs of cirrhosis to compensation. Continue metolazone, propranolol, spironolactone and torsemide.  Active Problems:   Diabetes mellitus (HCC) Carbohydrate modified diet. CBG monitoring before  meals and bedtime. May use her insulin pump as indicated by endocrinologist while in the hospital.    Iron deficiency anemia Monitor hematocrit and hemoglobin. Per patient, her oral iron is giving her GI upset and constipation. Consider ferrous fumarate or different ferrous preparation.    GERD (gastroesophageal reflux disease) Protonix 40 mg by mouth daily. Continue Carafate 4 times a day before meals and bedtime.    Thrombocytopenia (HCC) Monitor platelet level.    Chronic diastolic heart failure (HCC) No signs of decompensation at this time. Continue propranolol, torsemide, metolazone and spironolactone.    CAD (coronary artery disease) Continue beta blocker and low-dose statin.    Hypothyroidism Continue levothyroxine 25 g by mouth daily. Monitor TSH as needed.    DVT prophylaxis: SCDs. Code Status: Full code. Family Communication: Her husband was present in the ED room. Disposition Plan: Admit for lactulose treatment and MRI of the brain. Consults called: Tele-neurologist was consulted earlier. Admission status: Observation/telemetry.   Reubin [place] MD [facility] Pager [phone].  If 7PM-7AM, please contact night-coverage [email or web address] Password [email or web address]  This document was prepared using Systems analyst and may have some unintended errors.   07/18/2017, 5:48 AM

## 2017-07-18 NOTE — ED Notes (Signed)
Darius Bump, RN called for report. [place], unavailable at the time but is aware that RN called for report.

## 2017-07-18 NOTE — Care Management Obs Status (Signed)
[facility] NOTIFICATION   Patient Details  Name: Maria [name] MRN: [id number] Date of Birth: 1955-11-19   Medicare Observation Status Notification Given:  Yes    Sherald Barge, RN 07/18/2017, 2:28 PM

## 2017-07-19 ENCOUNTER — Ambulatory Visit: Payer: PPO | Admitting: Acute Care

## 2017-07-19 DIAGNOSIS — G9341 Metabolic encephalopathy: Secondary | ICD-10-CM | POA: Diagnosis not present

## 2017-07-19 DIAGNOSIS — R4182 Altered mental status, unspecified: Secondary | ICD-10-CM

## 2017-07-19 DIAGNOSIS — I5032 Chronic diastolic (congestive) heart failure: Secondary | ICD-10-CM | POA: Diagnosis not present

## 2017-07-19 DIAGNOSIS — E7220 Disorder of urea cycle metabolism, unspecified: Secondary | ICD-10-CM | POA: Diagnosis not present

## 2017-07-19 DIAGNOSIS — D696 Thrombocytopenia, unspecified: Secondary | ICD-10-CM | POA: Diagnosis not present

## 2017-07-19 DIAGNOSIS — K7460 Unspecified cirrhosis of liver: Secondary | ICD-10-CM | POA: Diagnosis not present

## 2017-07-19 LAB — GLUCOSE, CAPILLARY
Glucose-Capillary: 208 mg/dL — ABNORMAL HIGH (ref 65–99)
Glucose-Capillary: 376 mg/dL — ABNORMAL HIGH (ref 65–99)
Glucose-Capillary: 323 mg/dL — ABNORMAL HIGH (ref 65–99)
Glucose-Capillary: 333 mg/dL — ABNORMAL HIGH (ref 65–99)

## 2017-07-19 LAB — AMMONIA: Ammonia: 55 umol/L — ABNORMAL HIGH (ref 9–35)

## 2017-07-19 MED ORDER — INSULIN GLARGINE 100 UNIT/ML ~~LOC~~ SOLN
25.0000 [IU] | Freq: Every day | SUBCUTANEOUS | Status: DC
  Administered 2017-07-19: 25 [IU] via SUBCUTANEOUS
  Filled 2017-07-19 (×4): qty 0.25

## 2017-07-19 MED ORDER — FAMOTIDINE 20 MG PO TABS
20.0000 mg | ORAL_TABLET | Freq: Two times a day (BID) | ORAL | Status: DC | PRN
  Administered 2017-07-19: 20 mg via ORAL
  Filled 2017-07-19: qty 1

## 2017-07-19 NOTE — Progress Notes (Signed)
PROGRESS NOTE    Maria [name]  YIR:[id number] DOB: May 15, 1956 DOA: 07/18/2017 PCP: Glenda Chroman, MD     Brief Narrative:  61 year old woman admitted to the hospital on 10/31 due to acute encephalopathy.  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  Lesions were treated with APC and she received 3 units of PRBCs.  Ammonia level was 85 she was thought to have hepatic encephalopathy and admission was requested.   Assessment & Plan:   Principal Problem:   Hyperammonemia ([place]) Active Problems:   Diabetes mellitus (HCC)   Iron deficiency anemia   GERD (gastroesophageal reflux disease)   Thrombocytopenia (HCC)   Hepatic cirrhosis (HCC)   Chronic diastolic heart failure (HCC)   CAD (coronary artery disease)   Hypothyroidism   Acute metabolic encephalopathy with hyperammonemia -Thought to be hepatic encephalopathy. -She has been started on lactulose which should be titrated up to having 3 loose bowel movements a day. -Her confusion seems to be improving although she is still listless. -She has a history of cirrhosis due to [facility], patient has been referred by her primary GI to [facility] for transplant evaluation. -Dr. Laural Golden believes that hepatic encephalopathy may have been triggered by dehydration and has recommended cessation of diuretic therapy for now.  Type 1 diabetes -Was on insulin pump prior to admission, however an A1c of 4.2% would indicate that she has had some periods of hypoglycemia. -Given her confusion insulin pump has been discontinued. -On 10/31 she was placed on Lantus 20 units at bedtime with a moderate sliding scale. -CBGs have remained in the 200s, will increase Lantus to 25.  Chronic diastolic CHF -No sign of decompensation at present, in fact if anything may have been somewhat over diuresed during her prior hospitalization.  History of coronary artery  disease -Stable, no chest pain.  Continue beta-blocker and statin.  Hypothyroidism -Continue home dose of Synthroid. -For now I believe she should be discharged on a basal bolus regimen without pump for now until she follows up with her outpatient endocrinologist.   DVT prophylaxis: SCDs Code Status: Full code Family Communication: Patient only, discussed with husband and daughter at bedside on 10/31 Disposition Plan: Home when medically stable, anticipate 48-72 hours  Consultants:   GI  Procedures:   None  Antimicrobials:  Anti-infectives    None       Subjective: Appears less confused today, is asking appropriate questions,  Objective: Vitals:   07/18/17 2117 07/19/17 0606 07/19/17 1143 07/19/17 1536  BP:  (!) 119/43  (!) 134/45  Pulse:  94  97  Resp:  18  18  Temp:  98.3 F (36.8 C)  98 F (36.7 C)  TempSrc:  Oral    SpO2: 92% 93% 90% 91%  Weight:      Height:        Intake/Output Summary (Last 24 hours) at 07/19/17 1657 Last data filed at 07/19/17 0948  Gross per 24 hour  Intake              580 ml  Output              900 ml  Net             -320 ml   Filed Weights   07/18/17 0225 07/18/17 0538  Weight: 102.1 kg (225 lb) 99 kg (218 lb 3.2 oz)    Examination:  General exam:  Alert, awake, oriented x 3 Respiratory system: Clear to auscultation. Respiratory effort normal. Cardiovascular system:RRR. No murmurs, rubs, gallops. Gastrointestinal system: Abdomen is nondistended, soft and nontender. No organomegaly or masses felt. Normal bowel sounds heard. Central nervous system: Alert and oriented. No focal neurological deficits. Extremities: No C/C/E, +pedal pulses Skin: No rashes, lesions or ulcers Psychiatry: Judgement and insight appear normal. Mood & affect appropriate.     Data Reviewed: I have personally reviewed following labs and imaging studies  CBC:  Recent Labs Lab 07/18/17 0241  WBC 7.7  NEUTROABS 5.5  HGB 9.7*  HCT 31.3*    MCV 91.3  PLT 67*   Basic Metabolic Panel:  Recent Labs Lab 07/18/17 0241  NA 138  K 3.4*  CL 102  CO2 27  GLUCOSE 135*  BUN 19  CREATININE 1.07*  CALCIUM 8.6*  MG 1.9   GFR: Estimated Creatinine Clearance: 61.1 mL/min (A) (by C-G formula based on SCr of 1.07 mg/dL (H)). Liver Function Tests:  Recent Labs Lab 07/18/17 0241  AST 32  ALT 20  ALKPHOS 110  BILITOT 1.6*  PROT 6.4*  ALBUMIN 2.9*   No results for input(s): LIPASE, AMYLASE in the last 168 hours.  Recent Labs Lab 07/18/17 0241 07/19/17 1135  AMMONIA 85* 55*   Coagulation Profile:  Recent Labs Lab 07/18/17 0241  INR 1.33   Cardiac Enzymes:  Recent Labs Lab 07/18/17 0241  TROPONINI <0.03   BNP (last 3 results) No results for input(s): PROBNP in the last 8760 hours. HbA1C:  Recent Labs  07/18/17 1500  HGBA1C 4.2*   CBG:  Recent Labs Lab 07/18/17 2112 07/18/17 2208 07/19/17 0733 07/19/17 1113 07/19/17 1641  GLUCAP 280* 258* 208* 376* 323*   Lipid Profile: No results for input(s): CHOL, HDL, LDLCALC, TRIG, CHOLHDL, LDLDIRECT in the last 72 hours. Thyroid Function Tests: No results for input(s): TSH, T4TOTAL, FREET4, T3FREE, THYROIDAB in the last 72 hours. Anemia Panel: No results for input(s): VITAMINB12, FOLATE, FERRITIN, TIBC, IRON, RETICCTPCT in the last 72 hours. Urine analysis:    Component Value Date/Time   COLORURINE YELLOW 07/18/2017 0300   APPEARANCEUR CLEAR 07/18/2017 0300   LABSPEC 1.016 07/18/2017 0300   PHURINE 5.0 07/18/2017 0300   GLUCOSEU NEGATIVE 07/18/2017 0300   HGBUR NEGATIVE 07/18/2017 0300   BILIRUBINUR NEGATIVE 07/18/2017 0300   KETONESUR NEGATIVE 07/18/2017 0300   PROTEINUR NEGATIVE 07/18/2017 0300   NITRITE NEGATIVE 07/18/2017 0300   LEUKOCYTESUR NEGATIVE 07/18/2017 0300   Sepsis Labs: @LABRCNTIP (procalcitonin:4,lacticidven:4)  )No results found for this or any previous visit (from the past 240 hour(s)).       Radiology Studies: Ct  Head Wo Contrast  Result Date: 07/18/2017 CLINICAL DATA:  Altered level of consciousness. EXAM: CT HEAD WITHOUT CONTRAST TECHNIQUE: Contiguous axial images were obtained from the base of the skull through the vertex without intravenous contrast. COMPARISON:  None. FINDINGS: Brain: Equivocal loss of gray-white differentiation in the right temporal lobe versus artifact. New acute hemorrhage. No hydrocephalus. No midline shift or mass effect. Vascular: Atherosclerosis of skullbase vasculature without hyperdense vessel or abnormal calcification. Skull: No fracture or focal lesion. Sinuses/Orbits: Near complete opacification of the left maxillary sinus, left sphenoid sinus, moderate left ethmoid air cell opacification and fluid level in the left frontal sinus. Minimal opacification of left mastoid air cells. Visualized orbits are unremarkable. Other: None. IMPRESSION: 1. Equivocal loss of gray-white differentiation in the right temporal lobe which can be seen with acute ischemia, recommend MRI for further evaluation. 2. Left paranasal  sinus disease with near complete opacification of the maxillary sinus, sphenoid sinus, and mucosal thickening of the frontal sinus and ethmoid air cells. Electronically Signed   By: Jeb Levering M.D.   On: 07/18/2017 02:33   Mr Brain Wo Contrast  Result Date: 07/18/2017 CLINICAL DATA:  Altered mental status for 2 days EXAM: MRI HEAD WITHOUT CONTRAST TECHNIQUE: Multiplanar, multiecho pulse sequences of the brain and surrounding structures were obtained without intravenous contrast. COMPARISON:  Head CT from earlier today FINDINGS: Brain: No acute infarction or evidence of hemorrhage, hydrocephalus, extra-axial collection or mass lesion. T1 hyperintensity within globus pallidi attributed to the patient's cirrhosis. Normal brain volume. Vascular: No detectable abnormality. Skull and upper cervical spine: Hypointense appearance of marrow which is nonspecific. No focal mass is seen.  Sinuses/Orbits: Left maxillary, ethmoid, and frontal sinusitis due to middle meatus obstruction. There are scattered fluid levels. Other: Significantly motion degraded study which could obscure pathology. IMPRESSION: 1. Significantly motion degraded study which could obscure pathology. Fortunately, diffusion imaging is diagnostic and negative for infarct, as questioned on preceding head CT. 2. Left middle meatus obstruction with sinusitis, potentially acute. 3. Diffusely hypointense marrow signal. Please correlate with CBC and iron studies. 4. Altered signal in the basal ganglia likely from patient's cirrhosis. Electronically Signed   By: Monte Fantasia M.D.   On: 07/18/2017 09:27        Scheduled Meds: . cholecalciferol  1,000 Units Oral Q M,W,F  . fluticasone furoate-vilanterol  1 puff Inhalation Daily  . insulin aspart  0-15 Units Subcutaneous TID WC  . insulin aspart  0-5 Units Subcutaneous QHS  . insulin aspart  4 Units Subcutaneous TID WC  . insulin glargine  25 Units Subcutaneous QHS  . lactulose  20 g Oral TID  . levothyroxine  25 mcg Oral QAC breakfast  . pantoprazole  40 mg Oral Daily  . PARoxetine  40 mg Oral BH-q7a  . potassium chloride SA  20 mEq Oral BID  . rosuvastatin  5 mg Oral QODAY  . sucralfate  1 g Oral TID WC & HS   Continuous Infusions:   LOS: 0 days    Time spent: 35 minutes. Greater than 50% of this time was spent in direct contact with the patient coordinating care.     Lelon Frohlich, MD [facility] Pager [phone]  If 7PM-7AM, please contact night-coverage [email or web address] Password [facility] 07/19/2017, 4:57 PM

## 2017-07-19 NOTE — Progress Notes (Addendum)
Inpatient Diabetes Program Recommendations  AACE/ADA: New Consensus Statement on Inpatient Glycemic Control (2015)  Target Ranges:  Prepandial:   less than 140 mg/dL      Peak postprandial:   less than 180 mg/dL (1-2 hours)      Critically ill patients:  140 - 180 mg/dL  Results for Maria [name], Maria [name] (MRN [id number]) as of 07/19/2017 10:53  Ref. Range 07/18/2017 07:43 07/18/2017 11:12 07/18/2017 16:47 07/18/2017 21:12 07/18/2017 22:08 07/19/2017 07:33  Glucose-Capillary Latest Ref Range: 65 - 99 mg/dL 142 (H) 179 (H) 200 (H) 280 (H) 258 (H) 208 (H)   Results for Maria [name], Maria [name] (MRN [id number]) as of 07/19/2017 10:53  Ref. Range 07/18/2017 15:00  Mean Plasma Glucose Latest Units: mg/dL 73.84  Hemoglobin A1C Latest Ref Range: 4.8 - 5.6 % 4.2 (L)   Review of Glycemic Control  Diabetes history: DM2 Outpatient Diabetes medications:  Medtronic insulin pump with Humalog Current orders for Inpatient glycemic control: Lantus 20 units QHS, Novolog 0-15 units TID with meals, Novolog 0-5 units QHS, Novolog 4 units TID with meals  Inpatient Diabetes Program Recommendations:  Insulin - Basal: Please consider increasing Lantus to 25 units QHS. HgbA1C: A1C 4.2% on 07/18/17 indicating an average glucose of 74 mg/dl over the past 2-3 months. A1C of 4.2% indicative that patient is likely experiencing significant hypoglycemia. I spoke with patient 07/06/17 during last hospitalization and patient reported recent issues hypoglycemia as an outpatient and was using her insulin pump intermittently while inpatient. Recommend patient be discharged on Lantus and Humalog insulin regimen (similar to inpatient regimen if glucose is well controlled on regimen) and have patient follow up with Maria [name] about resuming the insulin pump.  NOTE: Patient was recently inpatient from 07/04/17 to 07/10/17. Diabetes Coordinator spoke with patient at length on 07/06/17 (see note for details).  Medtronic Insulin pump settings were  reviewed on 07/06/17 and they were as follow:  Basal insulin  12A-3A            3.50 units/hour 3A-8:30A         3.6 units/hour 8:30A-2:30P    5 units/hour 2:30P-7P         5.10 units/hour 7P-12A            4.6 units/hour Total daily basal insulin: 106.25 units/24 hours  Carb Coverage 1:8       1 unit for every 8 grams of carbohydrates  Insulin Sensitivity 12A-7A                        1:50     1 unit drops blood glucose 50 mg/dl 7A-9:30P        1:30     1 unit drops blood glucose 30 mg/dl 9:30P-12A        1:50     1 unit drops blood glucose 50 mg/dl  Target Glucose Goals 12A-7A            110-130 mg/dl 7A-9:30P         100-120 mg/dl 9:30P-12A       110-130 mg/dl  Addendum 07/19/17@13 :18-Spoke with patient regarding DM control and insulin pump. Patient confirms that she does not have on insulin pump as it was removed yesterday.  Patient states that she has continued to have hypoglycemia at home since she was last discharged. Discussed A1C 4.2% which indicates average glucose of 74 mg/dl. Informed patient that A1C is too low and it would be recommended for her to see Maria [name]  before she resumes her insulin pump and to be discharged on Lantus and Novolog for DM control until she can se Maria [name]. Patient is agreeable. Explained to patient it would be up to the doctor as to whether she will use SQ injections or resume her insulin pump. Patient verbalized understanding of information discussed and she reports that she has no further questions at this time.  Thanks, Barnie Alderman, RN, MSN, CDE Diabetes Coordinator Inpatient Diabetes Program [phone] (Team Pager from 8am to 5pm)

## 2017-07-19 NOTE — Consult Note (Signed)
Reason for Consult: elevated ammonia    Maria [name] is an 61 y.o. female.  HPI:  Admitted thru the ED yesterday with altered mental status. Hx of cirrhosis secondary to NASH complicated by esophageal varices. She has been banded multiple times. Hx of IDA. Ammonia noted to be elevated at 85. She was started on Lactulose this admission.. Ammonia level today is pending. Feels better today. No confusion.  No prior hx of hepatic encephalopathy.  Underwent and EGD 07/06/2017; Impression:               - Grade I esophageal varices.                           - Scar in the lower third of the esophagus.                           - Z-line regular, 35 cm from the incisors.                           - 2 cm hiatal hernia.                           - Portal hypertensive gastropathy.                           - Gastric antral vascular ectasia with bleeding.                            Treated with argon plasma coagulation (APC).                           - Mucosal changes in the duodenum Underwent a cardiac cath 07/12/2017: Conclusion   1. Normal left and right heart filling pressures.  2. Borderline elevated PA pressure.  3. High cardiac output.     Past Medical History:  Diagnosis Date  . Anemia   . CHF (congestive heart failure) ([place])   . Cirrhosis ([place])   . Depression   . Diabetes mellitus   . Esophageal bleed, non-variceal   . Eye hemorrhage    Behind left eye  . Fibromyalgia   . Hypercholesteremia   . Hypertension   . Hypothyroidism   . NAFLD (nonalcoholic fatty liver disease)   . Osteoarthrosis   . Osteoporosis   . PONV (postoperative nausea and vomiting)   . UTI (urinary tract infection) 11/12 end of the month   Patient feels that she passed a kidney stone at that time    Past Surgical History:  Procedure Laterality Date  . APPENDECTOMY  1980  . BILATERAL SALPINGOOPHORECTOMY    . CHOLECYSTECTOMY    . COLONOSCOPY  03/15/2011  . COLONOSCOPY N/A 03/24/2016   Procedure:  COLONOSCOPY;  Surgeon: [name], MD;  [facility];  Service: Endoscopy;  Laterality: N/A;  855  . ESOPHAGEAL BANDING  04/24/2012   Procedure: ESOPHAGEAL BANDING;  Surgeon: [name], MD;  [facility];  Service: Endoscopy;  Laterality: N/A;  . Esophageal BANDING  08/03/2012   [facility] [place] [facility] [place]  09/24/2012   Procedure: ESOPHAGEAL BANDING;  Surgeon: [name], MD;  Location: [facility];  Service: Endoscopy;  Laterality: N/A;  Banding x 3  . ESOPHAGEAL BANDING N/A 01/29/2013   Procedure: ESOPHAGEAL BANDING;  Surgeon: [name], MD;  [facility];  Service: Endoscopy;  Laterality: N/A;  . ESOPHAGEAL BANDING N/A 06/19/2014   Procedure: ESOPHAGEAL BANDING;  Surgeon: [name], MD;  [facility];  Service: Endoscopy;  Laterality: N/A;  . ESOPHAGEAL BANDING N/A 01/05/2016   Procedure: ESOPHAGEAL BANDING;  Surgeon: [name], MD;  [facility];  Service: Endoscopy;  Laterality: N/A;  . ESOPHAGEAL BANDING N/A 08/03/2016   Procedure: ESOPHAGEAL BANDING;  Surgeon: [name], MD;  [facility];  Service: Endoscopy;  Laterality: N/A;  . ESOPHAGEAL BANDING N/A 05/02/2017   Procedure: ESOPHAGEAL BANDING;  Surgeon: [name], MD;  [facility];  Service: Endoscopy;  Laterality: N/A;  . ESOPHAGOGASTRODUODENOSCOPY  04/24/2012   Procedure: ESOPHAGOGASTRODUODENOSCOPY (EGD);  Surgeon: [name], MD;  [facility];  Service: Endoscopy;  Laterality: N/A;  300  . ESOPHAGOGASTRODUODENOSCOPY N/A 01/29/2013   Procedure: ESOPHAGOGASTRODUODENOSCOPY (EGD);  Surgeon: [name], MD;  [facility];  Service: Endoscopy;  Laterality: N/A;  1200  . ESOPHAGOGASTRODUODENOSCOPY N/A 05/22/2013   Procedure: ESOPHAGOGASTRODUODENOSCOPY (EGD);  Surgeon: [name], MD;  [facility];  Service: Endoscopy;  Laterality: N/A;  1:55  .  ESOPHAGOGASTRODUODENOSCOPY N/A 12/31/2013   Procedure: ESOPHAGOGASTRODUODENOSCOPY (EGD);  Surgeon: [name], MD;  [facility];  Service: Endoscopy;  Laterality: N/A;  1200  . ESOPHAGOGASTRODUODENOSCOPY N/A 06/19/2014   Procedure: ESOPHAGOGASTRODUODENOSCOPY (EGD);  Surgeon: [name], MD;  [facility];  Service: Endoscopy;  Laterality: N/A;  1055  . ESOPHAGOGASTRODUODENOSCOPY N/A 01/15/2015   Procedure: ESOPHAGOGASTRODUODENOSCOPY (EGD);  Surgeon: [name], MD;  [facility];  Service: Endoscopy;  Laterality: N/A;  730 - moved to 9:45 - moved to 1250-Ann notified pt  . ESOPHAGOGASTRODUODENOSCOPY N/A 06/30/2015   Procedure: ESOPHAGOGASTRODUODENOSCOPY (EGD);  Surgeon: [name], MD;  [facility];  Service: Endoscopy;  Laterality: N/A;  1200  . ESOPHAGOGASTRODUODENOSCOPY N/A 01/05/2016   Procedure: ESOPHAGOGASTRODUODENOSCOPY (EGD);  Surgeon: [name], MD;  [facility];  Service: Endoscopy;  Laterality: N/A;  830  . ESOPHAGOGASTRODUODENOSCOPY N/A 08/03/2016   Procedure: ESOPHAGOGASTRODUODENOSCOPY (EGD);  Surgeon: [name], MD;  [facility];  Service: Endoscopy;  Laterality: N/A;  930  . ESOPHAGOGASTRODUODENOSCOPY N/A 05/02/2017   Procedure: ESOPHAGOGASTRODUODENOSCOPY (EGD);  Surgeon: [name], MD;  [facility];  Service: Endoscopy;  Laterality: N/A;  12:00  . ESOPHAGOGASTRODUODENOSCOPY (EGD) WITH PROPOFOL  09/24/2012   Procedure: ESOPHAGOGASTRODUODENOSCOPY (EGD) WITH PROPOFOL;  Surgeon: [name], MD;  [facility];  Service: Endoscopy;  Laterality: N/A;  GE junction at 36  . ESOPHAGOGASTRODUODENOSCOPY (EGD) WITH PROPOFOL N/A 07/06/2017   Procedure: ESOPHAGOGASTRODUODENOSCOPY (EGD) WITH PROPOFOL;  Surgeon: [name], MD;  [facility];  Service: Endoscopy;  Laterality: N/A;  . ESOPHAGOGASTRODUODENOSCOPY W/ BANDING  08/2010  . GIVENS CAPSULE STUDY N/A  07/05/2017   Procedure: GIVENS CAPSULE STUDY;  Surgeon: [name], MD;  [facility];  Service: Endoscopy;  Laterality: N/A;  . POLYPECTOMY  03/24/2016   Procedure: POLYPECTOMY;  Surgeon: [name], MD;  [facility];  Service: Endoscopy;;  sigmoid polyp  . RIGHT HEART CATH N/A 04/16/2017   Procedure: Right Heart Cath;  Surgeon: [name],  MD;  Location: [place] [facility];  Service: Cardiovascular;  Laterality: N/A;  . RIGHT HEART CATH N/A 07/12/2017   Procedure: RIGHT HEART CATH;  Surgeon: [name], MD;  Location: [place] [facility];  Service: Cardiovascular;  Laterality: N/A;  . TONSILLECTOMY    . UPPER GASTROINTESTINAL ENDOSCOPY  03/15/2011   EGD ED BANDING/TCS  . UPPER GASTROINTESTINAL ENDOSCOPY  09/05/2010  . UPPER GASTROINTESTINAL ENDOSCOPY  08/11/2010  . VAGINAL HYSTERECTOMY      Family History  Problem Relation Age of Onset  . Lung cancer Mother   . Diabetes Father   . Diabetes Sister   . Hypertension Sister   . Hypothyroidism Sister   . Colon cancer Brother   . Diabetes Sister   . Hypothyroidism Brother   . Healthy Daughter   . Obesity Daughter   . Hypertension Daughter     Social History:  reports that she has never smoked. She has never used smokeless tobacco. She reports that she does not drink alcohol or use drugs.  Allergies:  Allergies  Allergen Reactions  . Feraheme [Ferumoxytol]     Patient has severe back and chest pain when receiving FERAHEME infusions.    Medications: I have reviewed the patient's current medications.  Results for orders placed or performed during the hospital encounter of 07/18/17 (from the past 48 hour(s))  Ethanol     Status: None   Collection Time: 07/18/17  2:41 AM  Result Value Ref Range   Alcohol, Ethyl (B) <10 <10 mg/dL    Comment:        LOWEST DETECTABLE LIMIT FOR SERUM ALCOHOL IS 10 mg/dL FOR MEDICAL PURPOSES ONLY   Protime-INR     Status: Abnormal   Collection Time:  07/18/17  2:41 AM  Result Value Ref Range   Prothrombin Time 16.4 (H) 11.4 - 15.2 seconds   INR 1.33   APTT     Status: None   Collection Time: 07/18/17  2:41 AM  Result Value Ref Range   aPTT 32 24 - 36 seconds  CBC     Status: Abnormal   Collection Time: 07/18/17  2:41 AM  Result Value Ref Range   WBC 7.7 4.0 - 10.5 K/uL   RBC 3.43 (L) 3.87 - 5.11 MIL/uL   Hemoglobin 9.7 (L) 12.0 - 15.0 g/dL   HCT 31.3 (L) 36.0 - 46.0 %   MCV 91.3 78.0 - 100.0 fL   MCH 28.3 26.0 - 34.0 pg   MCHC 31.0 30.0 - 36.0 g/dL   RDW 26.8 (H) 11.5 - 15.5 %   Platelets 67 (L) 150 - 400 K/uL    Comment: SPECIMEN CHECKED FOR CLOTS PLATELET COUNT CONFIRMED BY SMEAR PLATELET CLUMPS NOTED ON SMEAR, COUNT APPEARS ADEQUATE   Differential     Status: None   Collection Time: 07/18/17  2:41 AM  Result Value Ref Range   Neutrophils Relative % 72 %   Lymphocytes Relative 17 %   Monocytes Relative 9 %   Eosinophils Relative 1 %   Basophils Relative 1 %   Neutro Abs 5.5 1.7 - 7.7 K/uL   Lymphs Abs 1.3 0.7 - 4.0 K/uL   Monocytes Absolute 0.7 0.1 - 1.0 K/uL   Eosinophils Absolute 0.1 0.0 - 0.7 K/uL   Basophils Absolute 0.1 0.0 - 0.1 K/uL   RBC Morphology ANISOCYTES     Comment: POLYCHROMASIA PRESENT  Comprehensive metabolic panel     Status: Abnormal   Collection Time: 07/18/17  2:41 AM  Result Value Ref Range   Sodium 138 135 - 145 mmol/L   Potassium 3.4 (L) 3.5 - 5.1 mmol/L   Chloride 102 101 - 111 mmol/L   CO2 27 22 - 32 mmol/L   Glucose, Bld 135 (H) 65 - 99 mg/dL   BUN 19 6 - 20 mg/dL   Creatinine, Ser 1.07 (H) 0.44 - 1.00 mg/dL   Calcium 8.6 (L) 8.9 - 10.3 mg/dL   Total Protein 6.4 (L) 6.5 - 8.1 g/dL   Albumin 2.9 (L) 3.5 - 5.0 g/dL   AST 32 15 - 41 U/L   ALT 20 14 - 54 U/L   Alkaline Phosphatase 110 38 - 126 U/L   Total Bilirubin 1.6 (H) 0.3 - 1.2 mg/dL   GFR calc non Af Amer 54 (L) >60 mL/min   GFR calc Af Amer >60 >60 mL/min    Comment: (NOTE) The eGFR has been calculated using the CKD EPI  equation. This calculation has not been validated in all clinical situations. eGFR's persistently <60 mL/min signify possible Chronic Kidney Disease.    Anion gap 9 5 - 15  Troponin I     Status: None   Collection Time: 07/18/17  2:41 AM  Result Value Ref Range   Troponin I <0.03 <0.03 ng/mL  Ammonia     Status: Abnormal   Collection Time: 07/18/17  2:41 AM  Result Value Ref Range   Ammonia 85 (H) 9 - 35 umol/L  Magnesium     Status: None   Collection Time: 07/18/17  2:41 AM  Result Value Ref Range   Magnesium 1.9 1.7 - 2.4 mg/dL  Urine rapid drug screen (hosp performed)     Status: None   Collection Time: 07/18/17  3:00 AM  Result Value Ref Range   Opiates NONE DETECTED NONE DETECTED   Cocaine NONE DETECTED NONE DETECTED   Benzodiazepines NONE DETECTED NONE DETECTED   Amphetamines NONE DETECTED NONE DETECTED   Tetrahydrocannabinol NONE DETECTED NONE DETECTED   Barbiturates NONE DETECTED NONE DETECTED    Comment:        DRUG SCREEN FOR MEDICAL PURPOSES ONLY.  IF CONFIRMATION IS NEEDED FOR ANY PURPOSE, NOTIFY LAB WITHIN 5 DAYS.        LOWEST DETECTABLE LIMITS FOR URINE DRUG SCREEN Drug Class       Cutoff (ng/mL) Amphetamine      1000 Barbiturate      200 Benzodiazepine   643 Tricyclics       329 Opiates          300 Cocaine          300 THC              50   Urinalysis, Routine w reflex microscopic     Status: None   Collection Time: 07/18/17  3:00 AM  Result Value Ref Range   Color, Urine YELLOW YELLOW   APPearance CLEAR CLEAR   Specific Gravity, Urine 1.016 1.005 - 1.030   pH 5.0 5.0 - 8.0   Glucose, UA NEGATIVE NEGATIVE mg/dL   Hgb urine dipstick NEGATIVE NEGATIVE   Bilirubin Urine NEGATIVE NEGATIVE   Ketones, ur NEGATIVE NEGATIVE mg/dL   Protein, ur NEGATIVE NEGATIVE mg/dL   Nitrite NEGATIVE NEGATIVE   Leukocytes, UA NEGATIVE NEGATIVE  Glucose, capillary     Status: Abnormal   Collection Time: 07/18/17  7:43 AM  Result Value Ref Range    Glucose-Capillary 142 (H) 65 - 99 mg/dL   Comment 1 Notify  RN    Comment 2 Document in Chart   Glucose, capillary     Status: Abnormal   Collection Time: 07/18/17 11:12 AM  Result Value Ref Range   Glucose-Capillary 179 (H) 65 - 99 mg/dL   Comment 1 Notify RN    Comment 2 Document in Chart   Hemoglobin A1c     Status: Abnormal   Collection Time: 07/18/17  3:00 PM  Result Value Ref Range   Hgb A1c MFr Bld 4.2 (L) 4.8 - 5.6 %    Comment: (NOTE) Pre diabetes:          5.7%-6.4% Diabetes:              >6.4% Glycemic control for   <7.0% adults with diabetes    Mean Plasma Glucose 73.84 mg/dL    Comment: Performed at [facility] [address], [place], [place] [address]  Glucose, capillary     Status: Abnormal   Collection Time: 07/18/17  4:47 PM  Result Value Ref Range   Glucose-Capillary 200 (H) 65 - 99 mg/dL   Comment 1 Notify RN    Comment 2 Document in Chart   Glucose, capillary     Status: Abnormal   Collection Time: 07/18/17  9:12 PM  Result Value Ref Range   Glucose-Capillary 280 (H) 65 - 99 mg/dL   Comment 1 Notify RN    Comment 2 Document in Chart   Glucose, capillary     Status: Abnormal   Collection Time: 07/18/17 10:08 PM  Result Value Ref Range   Glucose-Capillary 258 (H) 65 - 99 mg/dL   Comment 1 Notify RN    Comment 2 Document in Chart   Glucose, capillary     Status: Abnormal   Collection Time: 07/19/17  7:33 AM  Result Value Ref Range   Glucose-Capillary 208 (H) 65 - 99 mg/dL    Ct Head Wo Contrast  Result Date: 07/18/2017 CLINICAL DATA:  Altered level of consciousness. EXAM: CT HEAD WITHOUT CONTRAST TECHNIQUE: Contiguous axial images were obtained from the base of the skull through the vertex without intravenous contrast. COMPARISON:  None. FINDINGS: Brain: Equivocal loss of gray-white differentiation in the right temporal lobe versus artifact. New acute hemorrhage. No hydrocephalus. No midline shift or mass effect. Vascular: Atherosclerosis of  skullbase vasculature without hyperdense vessel or abnormal calcification. Skull: No fracture or focal lesion. Sinuses/Orbits: Near complete opacification of the left maxillary sinus, left sphenoid sinus, moderate left ethmoid air cell opacification and fluid level in the left frontal sinus. Minimal opacification of left mastoid air cells. Visualized orbits are unremarkable. Other: None. IMPRESSION: 1. Equivocal loss of gray-white differentiation in the right temporal lobe which can be seen with acute ischemia, recommend MRI for further evaluation. 2. Left paranasal sinus disease with near complete opacification of the maxillary sinus, sphenoid sinus, and mucosal thickening of the frontal sinus and ethmoid air cells. Electronically Signed   By: Jeb Levering M.D.   On: 07/18/2017 02:33   Mr Brain Wo Contrast  Result Date: 07/18/2017 CLINICAL DATA:  Altered mental status for 2 days EXAM: MRI HEAD WITHOUT CONTRAST TECHNIQUE: Multiplanar, multiecho pulse sequences of the brain and surrounding structures were obtained without intravenous contrast. COMPARISON:  Head CT from earlier today FINDINGS: Brain: No acute infarction or evidence of hemorrhage, hydrocephalus, extra-axial collection or mass lesion. T1 hyperintensity within globus pallidi attributed to the patient's cirrhosis. Normal brain volume. Vascular: No detectable abnormality. Skull and upper cervical spine: Hypointense appearance of  marrow which is nonspecific. No focal mass is seen. Sinuses/Orbits: Left maxillary, ethmoid, and frontal sinusitis due to middle meatus obstruction. There are scattered fluid levels. Other: Significantly motion degraded study which could obscure pathology. IMPRESSION: 1. Significantly motion degraded study which could obscure pathology. Fortunately, diffusion imaging is diagnostic and negative for infarct, as questioned on preceding head CT. 2. Left middle meatus obstruction with sinusitis, potentially acute. 3. Diffusely  hypointense marrow signal. Please correlate with CBC and iron studies. 4. Altered signal in the basal ganglia likely from patient's cirrhosis. Electronically Signed   By: Monte Fantasia M.D.   On: 07/18/2017 09:27    ROS Blood pressure (!) 119/43, pulse 94, temperature 98.3 F (36.8 C), temperature source Oral, resp. rate 18, height 5' 3"  (1.6 m), weight 218 lb 3.2 oz (99 kg), SpO2 93 %. Physical Exam Alert and oriented. Skin warm and dry. Oral mucosa is moist.   . Sclera anicteric, conjunctivae is pink. Thyroid not enlarged. No cervical lymphadenopathy. Lungs clear. Heart regular rate and rhythm.  Abdomen is soft. Bowel sounds are positive. No hepatomegaly. No abdominal masses felt. No tenderness.  No edema to lower extremities.    Assessment/Plan: Hepatic encephalopathy. Ammonia on admission 85 Ammonia is pending.  Continue Lactulose for now.   Keagon Glascoe W 07/19/2017, 10:27 AM

## 2017-07-19 NOTE — Progress Notes (Signed)
Documentation error.

## 2017-07-19 NOTE — Progress Notes (Signed)
Patient requested something for heartburn if possible. Text-paged mid-level provider. Donavan Foil, RN

## 2017-07-19 NOTE — Plan of Care (Signed)
Problem: Education: Goal: Knowledge of [facility] Education information/materials will improve Outcome: Progressing Discussed with patient general education/materials and review of care plan-pt verbalized understanding.

## 2017-07-20 DIAGNOSIS — K521 Toxic gastroenteritis and colitis: Secondary | ICD-10-CM | POA: Diagnosis present

## 2017-07-20 DIAGNOSIS — D509 Iron deficiency anemia, unspecified: Secondary | ICD-10-CM | POA: Diagnosis present

## 2017-07-20 DIAGNOSIS — K7581 Nonalcoholic steatohepatitis (NASH): Secondary | ICD-10-CM | POA: Diagnosis present

## 2017-07-20 DIAGNOSIS — E7220 Disorder of urea cycle metabolism, unspecified: Secondary | ICD-10-CM | POA: Diagnosis present

## 2017-07-20 DIAGNOSIS — I110 Hypertensive heart disease with heart failure: Secondary | ICD-10-CM | POA: Diagnosis present

## 2017-07-20 DIAGNOSIS — M810 Age-related osteoporosis without current pathological fracture: Secondary | ICD-10-CM | POA: Diagnosis present

## 2017-07-20 DIAGNOSIS — E039 Hypothyroidism, unspecified: Secondary | ICD-10-CM | POA: Diagnosis present

## 2017-07-20 DIAGNOSIS — D696 Thrombocytopenia, unspecified: Secondary | ICD-10-CM | POA: Diagnosis present

## 2017-07-20 DIAGNOSIS — K7290 Hepatic failure, unspecified without coma: Secondary | ICD-10-CM | POA: Diagnosis present

## 2017-07-20 DIAGNOSIS — I5032 Chronic diastolic (congestive) heart failure: Secondary | ICD-10-CM | POA: Diagnosis present

## 2017-07-20 DIAGNOSIS — G9341 Metabolic encephalopathy: Secondary | ICD-10-CM | POA: Diagnosis present

## 2017-07-20 DIAGNOSIS — E1043 Type 1 diabetes mellitus with diabetic autonomic (poly)neuropathy: Secondary | ICD-10-CM | POA: Diagnosis present

## 2017-07-20 DIAGNOSIS — I8510 Secondary esophageal varices without bleeding: Secondary | ICD-10-CM | POA: Diagnosis present

## 2017-07-20 DIAGNOSIS — E876 Hypokalemia: Secondary | ICD-10-CM | POA: Diagnosis present

## 2017-07-20 DIAGNOSIS — K7460 Unspecified cirrhosis of liver: Secondary | ICD-10-CM | POA: Diagnosis present

## 2017-07-20 DIAGNOSIS — K766 Portal hypertension: Secondary | ICD-10-CM | POA: Diagnosis present

## 2017-07-20 DIAGNOSIS — K219 Gastro-esophageal reflux disease without esophagitis: Secondary | ICD-10-CM | POA: Diagnosis present

## 2017-07-20 DIAGNOSIS — Z9981 Dependence on supplemental oxygen: Secondary | ICD-10-CM | POA: Diagnosis not present

## 2017-07-20 DIAGNOSIS — E860 Dehydration: Secondary | ICD-10-CM | POA: Diagnosis present

## 2017-07-20 DIAGNOSIS — K3184 Gastroparesis: Secondary | ICD-10-CM | POA: Diagnosis present

## 2017-07-20 DIAGNOSIS — K5900 Constipation, unspecified: Secondary | ICD-10-CM | POA: Diagnosis present

## 2017-07-20 DIAGNOSIS — E785 Hyperlipidemia, unspecified: Secondary | ICD-10-CM | POA: Diagnosis present

## 2017-07-20 DIAGNOSIS — M797 Fibromyalgia: Secondary | ICD-10-CM | POA: Diagnosis present

## 2017-07-20 DIAGNOSIS — I2510 Atherosclerotic heart disease of native coronary artery without angina pectoris: Secondary | ICD-10-CM | POA: Diagnosis present

## 2017-07-20 LAB — AMMONIA
Ammonia: 57 umol/L — ABNORMAL HIGH (ref 9–35)
Ammonia: 101 umol/L — ABNORMAL HIGH (ref 9–35)

## 2017-07-20 LAB — CBC WITH DIFFERENTIAL/PLATELET
Basophils Absolute: 0.1 10*3/uL (ref 0.0–0.1)
Basophils Relative: 1 %
Eosinophils Absolute: 0.2 10*3/uL (ref 0.0–0.7)
Eosinophils Relative: 1 %
HCT: 32.8 % — ABNORMAL LOW (ref 36.0–46.0)
Hemoglobin: 10.5 g/dL — ABNORMAL LOW (ref 12.0–15.0)
Lymphocytes Relative: 19 %
Lymphs Abs: 2 10*3/uL (ref 0.7–4.0)
MCH: 28.3 pg (ref 26.0–34.0)
MCHC: 32 g/dL (ref 30.0–36.0)
MCV: 88.4 fL (ref 78.0–100.0)
Monocytes Absolute: 1.4 10*3/uL — ABNORMAL HIGH (ref 0.1–1.0)
Monocytes Relative: 13 %
Neutro Abs: 6.9 10*3/uL (ref 1.7–7.7)
Neutrophils Relative %: 66 %
Platelets: 51 10*3/uL — ABNORMAL LOW (ref 150–400)
RBC: 3.71 MIL/uL — ABNORMAL LOW (ref 3.87–5.11)
RDW: 24.5 % — ABNORMAL HIGH (ref 11.5–15.5)
WBC: 10.6 10*3/uL — ABNORMAL HIGH (ref 4.0–10.5)

## 2017-07-20 LAB — GLUCOSE, CAPILLARY
Glucose-Capillary: 233 mg/dL — ABNORMAL HIGH (ref 65–99)
Glucose-Capillary: 327 mg/dL — ABNORMAL HIGH (ref 65–99)
Glucose-Capillary: 288 mg/dL — ABNORMAL HIGH (ref 65–99)
Glucose-Capillary: 339 mg/dL — ABNORMAL HIGH (ref 65–99)

## 2017-07-20 LAB — BASIC METABOLIC PANEL
Anion gap: 15 (ref 5–15)
BUN: 23 mg/dL — ABNORMAL HIGH (ref 6–20)
CO2: 26 mmol/L (ref 22–32)
Calcium: 9.4 mg/dL (ref 8.9–10.3)
Chloride: 94 mmol/L — ABNORMAL LOW (ref 101–111)
Creatinine, Ser: 1.39 mg/dL — ABNORMAL HIGH (ref 0.44–1.00)
GFR calc Af Amer: 46 mL/min — ABNORMAL LOW (ref >60)
GFR calc non Af Amer: 40 mL/min — ABNORMAL LOW (ref >60)
Glucose, Bld: 255 mg/dL — ABNORMAL HIGH (ref 65–99)
Potassium: 2.9 mmol/L — ABNORMAL LOW (ref 3.5–5.1)
Sodium: 135 mmol/L (ref 135–145)

## 2017-07-20 MED ORDER — RIFAXIMIN 550 MG PO TABS
550.0000 mg | ORAL_TABLET | Freq: Two times a day (BID) | ORAL | Status: DC
  Administered 2017-07-20 – 2017-07-22 (×4): 550 mg via ORAL
  Filled 2017-07-20 (×4): qty 1

## 2017-07-20 MED ORDER — INSULIN GLARGINE 100 UNIT/ML ~~LOC~~ SOLN
33.0000 [IU] | Freq: Every day | SUBCUTANEOUS | Status: DC
  Administered 2017-07-20: 33 [IU] via SUBCUTANEOUS
  Filled 2017-07-20 (×3): qty 0.33

## 2017-07-20 MED ORDER — RIFAXIMIN 550 MG PO TABS
550.0000 mg | ORAL_TABLET | Freq: Once | ORAL | Status: AC
  Administered 2017-07-20: 550 mg via ORAL
  Filled 2017-07-20: qty 1

## 2017-07-20 MED ORDER — POTASSIUM CHLORIDE CRYS ER 20 MEQ PO TBCR
40.0000 meq | EXTENDED_RELEASE_TABLET | ORAL | Status: AC
Start: 2017-07-20 — End: 2017-07-20
  Administered 2017-07-20 (×3): 40 meq via ORAL
  Filled 2017-07-20 (×3): qty 2

## 2017-07-20 MED ORDER — FLEET ENEMA 7-19 GM/118ML RE ENEM
1.0000 | ENEMA | Freq: Once | RECTAL | Status: AC
  Administered 2017-07-20: 1 via RECTAL

## 2017-07-20 MED ORDER — LACTULOSE 10 GM/15ML PO SOLN
20.0000 g | Freq: Four times a day (QID) | ORAL | Status: DC
  Administered 2017-07-20 – 2017-07-22 (×7): 20 g via ORAL
  Filled 2017-07-20 (×7): qty 30

## 2017-07-20 NOTE — Plan of Care (Signed)
Problem: Nutrition: Goal: Adequate nutrition will be maintained Outcome: Progressing Assessing and monitoring client's nutritional status; complaints of nausea & vomiting relieved by medication; monitoring intake/output

## 2017-07-20 NOTE — Progress Notes (Signed)
  Subjective:  Patient does not feel well.  She denies abdominal pain.  She vomited last evening.  She vomited food debris but no blood.  She had small bowel movement last night but none since this morning.  Patient tells me that she is sleepy.  She fell asleep while eating her lunch.  Objective: Blood pressure (!) 134/58, pulse 100, temperature 97.9 F (36.6 C), temperature source Oral, resp. rate 18, height 5' 3"  (1.6 m), weight 210 lb 4.8 oz (95.4 kg), SpO2 94 %. Patient is awake but somewhat sluggish. Asterixis present. Abdomen full but soft and nontender without organomegaly or masses. No LE edema or clubbing noted.  Patient's weight is 203 pounds on standing scale.  Labs/studies Results:   Recent Labs  07/18/17 0241 07/20/17 0454  WBC 7.7 10.6*  HGB 9.7* 10.5*  HCT 31.3* 32.8*  PLT 67* 51*    BMET   Recent Labs  07/18/17 0241 07/20/17 0454  NA 138 135  K 3.4* 2.9*  CL 102 94*  CO2 27 26  GLUCOSE 135* 255*  BUN 19 23*  CREATININE 1.07* 1.39*  CALCIUM 8.6* 9.4    LFT   Recent Labs  07/18/17 0241  PROT 6.4*  ALBUMIN 2.9*  AST 32  ALT 20  ALKPHOS 110  BILITOT 1.6*    PT/INR   Recent Labs  07/18/17 0241  LABPROT 16.4*  INR 1.33    Serum ammonia is 57; serum ammonia was 85 two days ago and 55 yesterday.  Assessment:  #1.  Hepatic encephalopathy appears to be secondary to dehydration. She continues to lose weight.  Diuretic therapy was discontinued yesterday.  She is encouraged to increase p.o. fluid intake.  If she is not able to do so will start IV.  #2.  Episode of vomiting last evening most likely due to gastroparesis. No further workup unless nausea and vomiting recurs.  #3.  Cirrhosis secondary to NASH with multiple complications.  MELd score yesterday was 12.  She has an appointment to see Dr. Monica Martinez at [facility] on on 08/07/2017.  #4.  Hypokalemia being addressed by Dr. Jerilee Hoh.  All diuretics have been  discontinued.   Recommendations:  Increase lactulose to 20 g p.o. 4 times daily.. Fleet enema once or twice now. Xifaxan 550 mg p.o. twice daily. Patient advised to increase intake of p.o. Fluids. Metabolic 7 and serum ammonia in a.m.

## 2017-07-20 NOTE — Plan of Care (Signed)
Problem: Education: Goal: Knowledge of [facility] Education information/materials will improve Outcome: Progressing Discussed with patient/family general health information and reviewed current plan of care appropriately.

## 2017-07-20 NOTE — Progress Notes (Signed)
Inpatient Diabetes Program Recommendations  AACE/ADA: New Consensus Statement on Inpatient Glycemic Control (2015)  Target Ranges:  Prepandial:   less than 140 mg/dL      Peak postprandial:   less than 180 mg/dL (1-2 hours)      Critically ill patients:  140 - 180 mg/dL   Lab Results  Component Value Date   GLUCAP 327 (H) 07/20/2017   HGBA1C 4.2 (L) 07/18/2017    Review of Glycemic Control Results for Maria [name], Maria [name] (MRN [id number]) as of 07/20/2017 13:35  Ref. Range 07/19/2017 11:13 07/19/2017 16:41 07/19/2017 22:12 07/20/2017 08:07 07/20/2017 11:17  Glucose-Capillary Latest Ref Range: 65 - 99 mg/dL 376 (H) 323 (H) 333 (H) 233 (H) 327 (H)   Inpatient Diabetes Program Recommendations:  Noted postprandial hyperglycemia. Please consider: -Increase Novolog meal coverage to 6 units tid if eats 50%  Thank you, Bethena Roys E. Laterrance Nauta, RN, MSN, CDE  Diabetes Coordinator Inpatient Glycemic Control Team Team Pager [phone] (8am-5pm) 07/20/2017 1:36 PM

## 2017-07-20 NOTE — Care Management Important Message (Signed)
Important Message  Patient Details  Name: Maria [name] MRN: [id number] Date of Birth: Nov 25, 1955   Medicare Important Message Given:  Yes    Sherald Barge, RN 07/20/2017, 3:31 PM

## 2017-07-20 NOTE — Care Management (Signed)
Pt visited to deliver IM. Pt in bed, alert to person and place but very slow to respond. Chart reviewed for CM needs. She is from home with husband. She has PCP, transportation and insurance with drug coverage. Unable to determine functional status at this time due to her not being completely clear mentally. Will cont to follow for DC planning needs.

## 2017-07-20 NOTE — Progress Notes (Signed)
PROGRESS NOTE    Maria [name]  LKG:[id number] DOB: 1956/01/27 DOA: 07/18/2017 PCP: Glenda Chroman, MD     Brief Narrative:  61 year old woman admitted to the hospital on 10/31 due to acute encephalopathy.  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  Lesions were treated with APC and she received 3 units of PRBCs.  Ammonia level was 85 she was thought to have hepatic encephalopathy and admission was requested.   Assessment & Plan:   Principal Problem:   Hyperammonemia ([place]) Active Problems:   Diabetes mellitus (HCC)   Iron deficiency anemia   GERD (gastroesophageal reflux disease)   Thrombocytopenia (HCC)   Hepatic cirrhosis (HCC)   Chronic diastolic heart failure (HCC)   CAD (coronary artery disease)   Hypothyroidism   Metabolic encephalopathy   Acute metabolic encephalopathy with hyperammonemia -Thought to be hepatic encephalopathy. -She has been started on lactulose which should be titrated up to having 3 loose bowel movements a day. -Her confusion seems to be improving. -She has a history of cirrhosis due to [facility], patient has been referred by her primary GI to [facility] for transplant evaluation. -Dr. Laural Golden believes that hepatic encephalopathy may have been triggered by dehydration and has recommended cessation of diuretic therapy for now.  Type 1 diabetes -Was on insulin pump prior to admission, however an A1c of 4.2% would indicate that she has had some periods of hypoglycemia. -Given her confusion insulin pump has been discontinued. -On 10/31 she was placed on Lantus 20 units at bedtime with a moderate sliding scale. -CBGs have remained in the 200-300ss, will increase Lantus again to 33 units.  Hypokalemia -Suspect due to diarrhea from lactulose. -Replace PO.  Chronic diastolic CHF -No sign of decompensation at present, in fact if anything may have been  somewhat over diuresed during her prior hospitalization.  History of coronary artery disease -Stable, no chest pain.  Continue beta-blocker and statin.  Hypothyroidism -Continue home dose of Synthroid. -For now I believe she should be discharged on a basal bolus regimen without pump for now until she follows up with her outpatient endocrinologist.   DVT prophylaxis: SCDs Code Status: Full code Family Communication: Patient only, discussed with husband and daughter at bedside on 10/31 Disposition Plan: Home when medically stable, anticipate 24-48 hours  Consultants:   GI  Procedures:   None  Antimicrobials:  Anti-infectives    None       Subjective: Less confused.  Objective: Vitals:   07/19/17 2224 07/20/17 0645 07/20/17 1400 07/20/17 1500  BP: (!) 127/48 (!) 121/50 (!) 134/58   Pulse: 97 97 100   Resp: 14 14 18    Temp: 98.4 F (36.9 C) 98.1 F (36.7 C) 97.9 F (36.6 C)   TempSrc: Oral  Oral   SpO2: 93% 92% 94%   Weight:    95.4 kg (210 lb 4.8 oz)  Height:        Intake/Output Summary (Last 24 hours) at 07/20/17 1547 Last data filed at 07/20/17 1200  Gross per 24 hour  Intake              240 ml  Output             1800 ml  Net            -1560 ml   Filed Weights   07/18/17 0225 07/18/17 0538 07/20/17 1500  Weight: 102.1 kg (225  lb) 99 kg (218 lb 3.2 oz) 95.4 kg (210 lb 4.8 oz)    Examination:  General exam: Alert, awake, oriented x 3 Respiratory system: Clear to auscultation. Respiratory effort normal. Cardiovascular system:RRR. No murmurs, rubs, gallops. Gastrointestinal system: Abdomen is nondistended, soft and nontender. No organomegaly or masses felt. Normal bowel sounds heard. Central nervous system: Alert and oriented. No focal neurological deficits. Extremities: No C/C/E, +pedal pulses Skin: No rashes, lesions or ulcers Psychiatry: Judgement and insight appear normal. Mood & affect appropriate.      Data Reviewed: I have personally  reviewed following labs and imaging studies  CBC:  Recent Labs Lab 07/18/17 0241 07/20/17 0454  WBC 7.7 10.6*  NEUTROABS 5.5 6.9  HGB 9.7* 10.5*  HCT 31.3* 32.8*  MCV 91.3 88.4  PLT 67* 51*   Basic Metabolic Panel:  Recent Labs Lab 07/18/17 0241 07/20/17 0454  NA 138 135  K 3.4* 2.9*  CL 102 94*  CO2 27 26  GLUCOSE 135* 255*  BUN 19 23*  CREATININE 1.07* 1.39*  CALCIUM 8.6* 9.4  MG 1.9  --    GFR: Estimated Creatinine Clearance: 46.1 mL/min (A) (by C-G formula based on SCr of 1.39 mg/dL (H)). Liver Function Tests:  Recent Labs Lab 07/18/17 0241  AST 32  ALT 20  ALKPHOS 110  BILITOT 1.6*  PROT 6.4*  ALBUMIN 2.9*   No results for input(s): LIPASE, AMYLASE in the last 168 hours.  Recent Labs Lab 07/18/17 0241 07/19/17 1135 07/20/17 0454  AMMONIA 85* 55* 57*   Coagulation Profile:  Recent Labs Lab 07/18/17 0241  INR 1.33   Cardiac Enzymes:  Recent Labs Lab 07/18/17 0241  TROPONINI <0.03   BNP (last 3 results) No results for input(s): PROBNP in the last 8760 hours. HbA1C:  Recent Labs  07/18/17 1500  HGBA1C 4.2*   CBG:  Recent Labs Lab 07/19/17 1113 07/19/17 1641 07/19/17 2212 07/20/17 0807 07/20/17 1117  GLUCAP 376* 323* 333* 233* 327*   Lipid Profile: No results for input(s): CHOL, HDL, LDLCALC, TRIG, CHOLHDL, LDLDIRECT in the last 72 hours. Thyroid Function Tests: No results for input(s): TSH, T4TOTAL, FREET4, T3FREE, THYROIDAB in the last 72 hours. Anemia Panel: No results for input(s): VITAMINB12, FOLATE, FERRITIN, TIBC, IRON, RETICCTPCT in the last 72 hours. Urine analysis:    Component Value Date/Time   COLORURINE YELLOW 07/18/2017 0300   APPEARANCEUR CLEAR 07/18/2017 0300   LABSPEC 1.016 07/18/2017 0300   PHURINE 5.0 07/18/2017 0300   GLUCOSEU NEGATIVE 07/18/2017 0300   HGBUR NEGATIVE 07/18/2017 0300   BILIRUBINUR NEGATIVE 07/18/2017 0300   KETONESUR NEGATIVE 07/18/2017 0300   PROTEINUR NEGATIVE 07/18/2017  0300   NITRITE NEGATIVE 07/18/2017 0300   LEUKOCYTESUR NEGATIVE 07/18/2017 0300   Sepsis Labs: @LABRCNTIP (procalcitonin:4,lacticidven:4)  )No results found for this or any previous visit (from the past 240 hour(s)).       Radiology Studies: No results found.      Scheduled Meds: . cholecalciferol  1,000 Units Oral Q M,W,F  . fluticasone furoate-vilanterol  1 puff Inhalation Daily  . insulin aspart  0-15 Units Subcutaneous TID WC  . insulin aspart  0-5 Units Subcutaneous QHS  . insulin aspart  4 Units Subcutaneous TID WC  . insulin glargine  33 Units Subcutaneous QHS  . lactulose  20 g Oral TID  . levothyroxine  25 mcg Oral QAC breakfast  . pantoprazole  40 mg Oral Daily  . PARoxetine  40 mg Oral BH-q7a  . potassium chloride SA  20  mEq Oral BID  . potassium chloride  40 mEq Oral Q4H  . rosuvastatin  5 mg Oral QODAY  . sucralfate  1 g Oral TID WC & HS   Continuous Infusions:   LOS: 0 days    Time spent: 35 minutes. Greater than 50% of this time was spent in direct contact with the patient coordinating care.     Lelon Frohlich, MD [facility] Pager [phone]  If 7PM-7AM, please contact night-coverage [email or web address] Password [facility] 07/20/2017, 3:47 PM

## 2017-07-21 DIAGNOSIS — E7220 Disorder of urea cycle metabolism, unspecified: Secondary | ICD-10-CM

## 2017-07-21 LAB — GLUCOSE, CAPILLARY
Glucose-Capillary: 259 mg/dL — ABNORMAL HIGH (ref 65–99)
Glucose-Capillary: 363 mg/dL — ABNORMAL HIGH (ref 65–99)
Glucose-Capillary: 343 mg/dL — ABNORMAL HIGH (ref 65–99)
Glucose-Capillary: 336 mg/dL — ABNORMAL HIGH (ref 65–99)

## 2017-07-21 LAB — BASIC METABOLIC PANEL
Anion gap: 9 (ref 5–15)
BUN: 22 mg/dL — ABNORMAL HIGH (ref 6–20)
CO2: 26 mmol/L (ref 22–32)
Calcium: 9 mg/dL (ref 8.9–10.3)
Chloride: 96 mmol/L — ABNORMAL LOW (ref 101–111)
Creatinine, Ser: 1.13 mg/dL — ABNORMAL HIGH (ref 0.44–1.00)
GFR calc Af Amer: 59 mL/min — ABNORMAL LOW (ref >60)
GFR calc non Af Amer: 51 mL/min — ABNORMAL LOW (ref >60)
Glucose, Bld: 222 mg/dL — ABNORMAL HIGH (ref 65–99)
Potassium: 3.6 mmol/L (ref 3.5–5.1)
Sodium: 131 mmol/L — ABNORMAL LOW (ref 135–145)

## 2017-07-21 LAB — MAGNESIUM: Magnesium: 1.7 mg/dL (ref 1.7–2.4)

## 2017-07-21 MED ORDER — INSULIN GLARGINE 100 UNIT/ML ~~LOC~~ SOLN
38.0000 [IU] | Freq: Every day | SUBCUTANEOUS | Status: DC
  Administered 2017-07-21: 38 [IU] via SUBCUTANEOUS
  Filled 2017-07-21 (×2): qty 0.38

## 2017-07-21 NOTE — Progress Notes (Addendum)
Patient ID: Maria [name], female   DOB: [birth year], 61 y.o.   MRN: [id number]   Assessment/Plan: ADMITTED WITH MS CHANGES AND CONSTIPATION/DEHYDRTION. CLINICALLY IMPROVED. HAVE 2-3 BMs A DAY. ULTRAM MADE HER FEEL WEAK.  PLAN: 1. OXYCODONE 2.5 MG Q6H PRN PAIN. D/C ULTRAM & LIST ULTRAM AS AN ALLERGY. 2. CONTINUE [place]. 3. CONTINUE PROTONIX.    Subjective: Since last evaluated the patient PT REPORTS ULTRAM MADE HER UNABLE TO WALK. SHE IS HAVING BMs(> 2-3/DAY). C/O HEADACHE BUT DOESN'T WANT ULTRAM ANYMORE.  Objective: Vital signs in last 24 hours: Vitals:   07/20/17 2114 07/21/17 0551  BP:  (!) 135/41  Pulse:  (!) 104  Resp:  20  Temp:  98 F (36.7 C)  SpO2: 92% 92%   General appearance: alert, cooperative and no distress Resp: clear to auscultation bilaterally Cardio: regular rate and rhythm GI: soft, non-tender; bowel sounds normal;  EXTREMITIES: TRACE EDEMA BILATERALLY  Lab Results:  K 3.6   Studies/Results: No results found.  Medications: I have reviewed the patient's current medications.   LOS: 5 days   Barney Drain 02/26/2014, 2:23 PM

## 2017-07-21 NOTE — Progress Notes (Signed)
PROGRESS NOTE    Maria [name]  XAJ:[id number] DOB: 02-23-1956 DOA: 07/18/2017 PCP: Glenda Chroman, MD     Brief Narrative:  61 year old woman admitted to the hospital on 10/31 due to acute encephalopathy.  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  Lesions were treated with APC and she received 3 units of PRBCs.  Ammonia level was 85 she was thought to have hepatic encephalopathy and admission was requested.   Assessment & Plan:   Principal Problem:   Hyperammonemia ([place]) Active Problems:   Diabetes mellitus (HCC)   Iron deficiency anemia   GERD (gastroesophageal reflux disease)   Thrombocytopenia (HCC)   Hepatic cirrhosis (HCC)   Chronic diastolic heart failure (HCC)   CAD (coronary artery disease)   Hypothyroidism   Metabolic encephalopathy   Acute metabolic encephalopathy with hyperammonemia -Thought to be hepatic encephalopathy. -She has been started on lactulose which should be titrated up to having 3 loose bowel movements a day. -Her confusion seems to be improving/pretty much at baseline. -She has a history of cirrhosis due to [facility], patient has been referred by her primary GI to [facility] for transplant evaluation. -Dr. Laural Golden believes that hepatic encephalopathy may have been triggered by dehydration and has recommended cessation of diuretic therapy for now.  Type 1 diabetes -Was on insulin pump prior to admission, however an A1c of 4.2% would indicate that she has had some periods of hypoglycemia. -Given her confusion insulin pump has been discontinued. -On 10/31 she was placed on Lantus 20 units at bedtime with a moderate sliding scale. -CBGs have remained in the 200-300ss, will increase Lantus again to 38 units. -For now I believe she should be discharged on a basal bolus regimen without pump for now until she follows up with her outpatient  endocrinologist.   Hypokalemia -Suspect due to diarrhea from lactulose. -Replaced orally.  Chronic diastolic CHF -No sign of decompensation at present, in fact if anything may have been somewhat over diuresed during her prior hospitalization.  History of coronary artery disease -Stable, no chest pain.  Continue beta-blocker and statin.  Hypothyroidism -Continue home dose of Synthroid.   DVT prophylaxis: SCDs Code Status: Full code Family Communication: Patient only, discussed with husband and daughter at bedside on 10/31 Disposition Plan: Home when medically stable, anticipate 24-48 hours  Consultants:   GI  Procedures:   None  Antimicrobials:  Anti-infectives    Start     Dose/Rate Route Frequency Ordered Stop   07/20/17 2200  rifaximin (XIFAXAN) tablet 550 mg     550 mg Oral 2 times daily 07/20/17 1604     07/20/17 1800  rifaximin (XIFAXAN) tablet 550 mg     550 mg Oral One time only - 1800 07/20/17 1604 07/20/17 1648       Subjective: Less confused, still weak  Objective: Vitals:   07/20/17 2042 07/20/17 2114 07/21/17 0551 07/21/17 1509  BP: 131/81  (!) 135/41 (!) 98/51  Pulse: 95  (!) 104 (!) 101  Resp: 20  20 20   Temp: 97.6 F (36.4 C)  98 F (36.7 C) 98.3 F (36.8 C)  TempSrc: Oral  Oral Oral  SpO2: 95% 92% 92% 95%  Weight:   93.7 kg (206 lb 8 oz)   Height:        Intake/Output Summary (Last 24 hours) at 07/21/17 1801 Last data filed at 07/21/17 1700  Gross per 24  hour  Intake             1080 ml  Output                0 ml  Net             1080 ml   Filed Weights   07/20/17 1500 07/20/17 1615 07/21/17 0551  Weight: 95.4 kg (210 lb 4.8 oz) 92.1 kg (203 lb) 93.7 kg (206 lb 8 oz)    Examination:  General exam: Alert, awake, oriented x 3 Respiratory system: Clear to auscultation. Respiratory effort normal. Cardiovascular system:RRR. No murmurs, rubs, gallops. Gastrointestinal system: Abdomen is nondistended, soft and nontender. No  organomegaly or masses felt. Normal bowel sounds heard. Central nervous system: Alert and oriented. No focal neurological deficits. Extremities: No C/C/E, +pedal pulses Skin: No rashes, lesions or ulcers Psychiatry: Judgement and insight appear normal. Mood & affect appropriate.        Data Reviewed: I have personally reviewed following labs and imaging studies  CBC:  Recent Labs Lab 07/18/17 0241 07/20/17 0454  WBC 7.7 10.6*  NEUTROABS 5.5 6.9  HGB 9.7* 10.5*  HCT 31.3* 32.8*  MCV 91.3 88.4  PLT 67* 51*   Basic Metabolic Panel:  Recent Labs Lab 07/18/17 0241 07/20/17 0454 07/21/17 0519  NA 138 135 131*  K 3.4* 2.9* 3.6  CL 102 94* 96*  CO2 27 26 26   GLUCOSE 135* 255* 222*  BUN 19 23* 22*  CREATININE 1.07* 1.39* 1.13*  CALCIUM 8.6* 9.4 9.0  MG 1.9  --  1.7   GFR: Estimated Creatinine Clearance: 56.1 mL/min (A) (by C-G formula based on SCr of 1.13 mg/dL (H)). Liver Function Tests:  Recent Labs Lab 07/18/17 0241  AST 32  ALT 20  ALKPHOS 110  BILITOT 1.6*  PROT 6.4*  ALBUMIN 2.9*   No results for input(s): LIPASE, AMYLASE in the last 168 hours.  Recent Labs Lab 07/18/17 0241 07/19/17 1135 07/20/17 0454 07/20/17 1816  AMMONIA 85* 55* 57* 101*   Coagulation Profile:  Recent Labs Lab 07/18/17 0241  INR 1.33   Cardiac Enzymes:  Recent Labs Lab 07/18/17 0241  TROPONINI <0.03   BNP (last 3 results) No results for input(s): PROBNP in the last 8760 hours. HbA1C: No results for input(s): HGBA1C in the last 72 hours. CBG:  Recent Labs Lab 07/20/17 1629 07/20/17 2046 07/21/17 0756 07/21/17 1128 07/21/17 1636  GLUCAP 288* 339* 259* 363* 343*   Lipid Profile: No results for input(s): CHOL, HDL, LDLCALC, TRIG, CHOLHDL, LDLDIRECT in the last 72 hours. Thyroid Function Tests: No results for input(s): TSH, T4TOTAL, FREET4, T3FREE, THYROIDAB in the last 72 hours. Anemia Panel: No results for input(s): VITAMINB12, FOLATE, FERRITIN, TIBC,  IRON, RETICCTPCT in the last 72 hours. Urine analysis:    Component Value Date/Time   COLORURINE YELLOW 07/18/2017 0300   APPEARANCEUR CLEAR 07/18/2017 0300   LABSPEC 1.016 07/18/2017 0300   PHURINE 5.0 07/18/2017 0300   GLUCOSEU NEGATIVE 07/18/2017 0300   HGBUR NEGATIVE 07/18/2017 0300   BILIRUBINUR NEGATIVE 07/18/2017 0300   KETONESUR NEGATIVE 07/18/2017 0300   PROTEINUR NEGATIVE 07/18/2017 0300   NITRITE NEGATIVE 07/18/2017 0300   LEUKOCYTESUR NEGATIVE 07/18/2017 0300   Sepsis Labs: @LABRCNTIP (procalcitonin:4,lacticidven:4)  )No results found for this or any previous visit (from the past 240 hour(s)).       Radiology Studies: No results found.      Scheduled Meds: . cholecalciferol  1,000 Units Oral Q M,W,F  . fluticasone  furoate-vilanterol  1 puff Inhalation Daily  . insulin aspart  0-15 Units Subcutaneous TID WC  . insulin aspart  0-5 Units Subcutaneous QHS  . insulin aspart  4 Units Subcutaneous TID WC  . insulin glargine  33 Units Subcutaneous QHS  . lactulose  20 g Oral QID  . levothyroxine  25 mcg Oral QAC breakfast  . pantoprazole  40 mg Oral Daily  . PARoxetine  40 mg Oral BH-q7a  . potassium chloride SA  20 mEq Oral BID  . rifaximin  550 mg Oral BID  . rosuvastatin  5 mg Oral QODAY  . sucralfate  1 g Oral TID WC & HS   Continuous Infusions:   LOS: 1 day    Time spent: 25 minutes. Greater than 50% of this time was spent in direct contact with the patient coordinating care.     Lelon Frohlich, MD [facility] Pager [phone]  If 7PM-7AM, please contact night-coverage [email or web address] Password [email or web address] 07/21/2017, 6:01 PM

## 2017-07-22 ENCOUNTER — Other Ambulatory Visit: Payer: Self-pay

## 2017-07-22 LAB — BASIC METABOLIC PANEL
Anion gap: 9 (ref 5–15)
BUN: 19 mg/dL (ref 6–20)
CO2: 24 mmol/L (ref 22–32)
Calcium: 9.2 mg/dL (ref 8.9–10.3)
Chloride: 98 mmol/L — ABNORMAL LOW (ref 101–111)
Creatinine, Ser: 1.03 mg/dL — ABNORMAL HIGH (ref 0.44–1.00)
GFR calc Af Amer: >60 mL/min (ref >60)
GFR calc non Af Amer: 57 mL/min — ABNORMAL LOW (ref >60)
Glucose, Bld: 229 mg/dL — ABNORMAL HIGH (ref 65–99)
Potassium: 3.7 mmol/L (ref 3.5–5.1)
Sodium: 131 mmol/L — ABNORMAL LOW (ref 135–145)

## 2017-07-22 LAB — GLUCOSE, CAPILLARY
Glucose-Capillary: 220 mg/dL — ABNORMAL HIGH (ref 65–99)
Glucose-Capillary: 255 mg/dL — ABNORMAL HIGH (ref 65–99)

## 2017-07-22 LAB — AMMONIA: Ammonia: 41 umol/L — ABNORMAL HIGH (ref 9–35)

## 2017-07-22 MED ORDER — LACTULOSE 10 GM/15ML PO SOLN: 20.0000 g | Freq: Four times a day (QID) | ORAL | 0 refills | Status: DC

## 2017-07-22 MED ORDER — TORSEMIDE 20 MG PO TABS: 40.0000 mg | ORAL_TABLET | Freq: Every day | ORAL | 6 refills | Status: DC

## 2017-07-22 MED ORDER — RIFAXIMIN 550 MG PO TABS: 550.0000 mg | ORAL_TABLET | Freq: Two times a day (BID) | ORAL | 2 refills | Status: DC

## 2017-07-22 MED ORDER — INSULIN ASPART 100 UNIT/ML FLEXPEN
PEN_INJECTOR | SUBCUTANEOUS | 11 refills | Status: DC
Start: 2017-07-22 — End: 2017-07-22

## 2017-07-22 MED ORDER — INSULIN PEN NEEDLE 31G X 5 MM MISC
12 refills | Status: DC
Start: 2017-07-22 — End: 2019-09-29

## 2017-07-22 MED ORDER — INSULIN ASPART 100 UNIT/ML FLEXPEN: PEN_INJECTOR | SUBCUTANEOUS | 11 refills | Status: DC

## 2017-07-22 MED ORDER — INSULIN GLARGINE 100 UNIT/ML SOLOSTAR PEN: 38.0000 [IU] | PEN_INJECTOR | Freq: Every day | SUBCUTANEOUS | 11 refills | Status: DC

## 2017-07-22 NOTE — Progress Notes (Signed)
NURSING PROGRESS NOTE  Maria [name] [id number] Discharge Data: 07/22/2017 1:48 PM Attending Provider: [name], Olam Idler* NOB:[id number], Costella Hatcher, MD     Jerilee Field to be D/C'd Home per MD order.  Discussed with the patient the After Visit Summary and all questions fully answered. All IV's discontinued with no bleeding noted. All belongings returned to patient for patient to take home.   Last Vital Signs:  Blood pressure (!) 158/52, pulse (!) 103, temperature 98.4 F (36.9 C), temperature source Oral, resp. rate 20, height 5' 3"  (1.6 m), weight 93.5 kg (206 lb 3.2 oz), SpO2 95 %.  Discharge Medication List Allergies as of 07/22/2017      Reactions   Feraheme [ferumoxytol]    Patient has severe back and chest pain when receiving FERAHEME infusions.   Ultram [tramadol Hcl]    WEAKNESS      Medication List    STOP taking these medications   insulin lispro 100 UNIT/ML injection Commonly known as:  HUMALOG   insulin pump Soln   metolazone 2.5 MG tablet Commonly known as:  ZAROXOLYN   spironolactone 100 MG tablet Commonly known as:  ALDACTONE     TAKE these medications   cholecalciferol 1000 units tablet Commonly known as:  VITAMIN D Take 1,000 Units by mouth every Monday, Wednesday, and Friday.   flintstones complete 60 MG chewable tablet Chew 1 tablet by mouth daily.   fluticasone furoate-vilanterol 200-25 MCG/INH Aepb Commonly known as:  BREO ELLIPTA Inhale 1 puff into the lungs daily.   insulin aspart 100 UNIT/ML FlexPen Commonly known as:  NOVOLOG Use with given sliding scale  CBG 70-120: 0 units CBG 121-150: 1 unit CBG 151-200: 2 units CBG 201-250: 3 units CBG 251-300: 5 units CBG 301-350: 7 units CBG greater than 351: 9 units   Insulin Glargine 100 UNIT/ML Solostar Pen Commonly known as:  LANTUS SOLOSTAR Inject 38 Units daily at 10 pm into the skin.   Insulin Pen Needle 31G X 5 MM Misc Use with each insulin injection   lactulose 10  GM/15ML solution Commonly known as:  CHRONULAC Take 30 mLs (20 g total) 4 (four) times daily by mouth.   levothyroxine 25 MCG tablet Commonly known as:  SYNTHROID, LEVOTHROID Take 25 mcg by mouth daily before breakfast.   OXYGEN Inhale 2 L into the lungs every evening. WITH SLEEP.   pantoprazole 40 MG tablet Commonly known as:  PROTONIX Take 1 tablet by mouth once daily   PARoxetine 40 MG tablet Commonly known as:  PAXIL Take 40 mg by mouth every morning.   potassium chloride SA 20 MEQ tablet Commonly known as:  K-DUR,KLOR-CON Take 20 mEq by mouth 2 (two) times daily.   propranolol 20 MG tablet Commonly known as:  INDERAL Take 1 tablet (20 mg total) by mouth at bedtime as needed (for increased BP).   rifaximin 550 MG Tabs tablet Commonly known as:  XIFAXAN Take 1 tablet (550 mg total) 2 (two) times daily by mouth.   rosuvastatin 5 MG tablet Commonly known as:  CRESTOR Take 1 tablet (5 mg total) by mouth every other day.   sucralfate 1 GM/10ML suspension Commonly known as:  CARAFATE Take 10 mLs (1 g total) by mouth 4 (four) times daily -  with meals and at bedtime.   torsemide 20 MG tablet Commonly known as:  DEMADEX Take 2 tablets (40 mg total) daily by mouth. What changed:  how much to take Notes to patient:  Hold diuretic  like GI doctor recommended.

## 2017-07-22 NOTE — Progress Notes (Signed)
Patient ID: Maria [name], female   DOB: 12-25-1955, 61 y.o.   MRN: [id number]   Assessment/Plan: ADMITTED WITH MENTAL STATUS CHANGES POSSIBLY DUE TO DEHYDRATION. REVIEWED U/S/CT SCANS  AND EXPLAINED TO PT AT THIS TIME SHE DOESN'T NEED DIURETICS. DR. Laural Golden CALLED TO CHECK ON HER LAST NIGHT.  PLAN: 1. D/C ON LACTULOSE AND XIFAFXAN. 2. HOLD DIURETICS. DR. Laural Golden Burnett Sheng IF NEEDED 3. CONTINUE [place].   Subjective: Since I last evaluated the patient SHE HAD 9 SMALL BOWEL MOVEMENTS IN PAST 24 HRS. HAS QUESTIONS REGARDING DIURETICS.  Objective: Vital signs in last 24 hours: Vitals:   07/21/17 2129 07/22/17 0658  BP: 124/60 (!) 158/52  Pulse:  (!) 103  Resp:  20  Temp:  98.4 F (36.9 C)  SpO2:  95%   General appearance: alert, cooperative and no distress Resp: clear to auscultation bilaterally Cardio: regular rate and rhythm GI: soft, non-tender; bowel sounds normal; EXTREMITIES: NO EDEMA NEURO: NO FOCAL DEFICITS  Lab Results:  K 3.7 Cr 1.03   Studies/Results: No results found.  Medications: I have reviewed the patient's current medications.   LOS: 5 days   Barney Drain 02/26/2014, 2:23 PM

## 2017-07-25 DIAGNOSIS — R0600 Dyspnea, unspecified: Secondary | ICD-10-CM | POA: Diagnosis not present

## 2017-07-27 ENCOUNTER — Encounter (HOSPITAL_COMMUNITY): Payer: Self-pay | Admitting: Cardiology

## 2017-07-27 ENCOUNTER — Ambulatory Visit (HOSPITAL_COMMUNITY)
Admission: RE | Admit: 2017-07-27 | Discharge: 2017-07-27 | Disposition: A | Discharge status: Home / Self Care | Payer: PPO | Source: Ambulatory Visit | Attending: Cardiology | Admitting: Cardiology

## 2017-07-27 VITALS — BP 138/76 | HR 77 | Wt 211.1 lb

## 2017-07-27 DIAGNOSIS — I272 Pulmonary hypertension, unspecified: Secondary | ICD-10-CM | POA: Diagnosis not present

## 2017-07-27 DIAGNOSIS — I5032 Chronic diastolic (congestive) heart failure: Secondary | ICD-10-CM | POA: Diagnosis not present

## 2017-07-27 DIAGNOSIS — Z79899 Other long term (current) drug therapy: Secondary | ICD-10-CM | POA: Insufficient documentation

## 2017-07-27 DIAGNOSIS — K7581 Nonalcoholic steatohepatitis (NASH): Secondary | ICD-10-CM | POA: Diagnosis not present

## 2017-07-27 DIAGNOSIS — Z794 Long term (current) use of insulin: Secondary | ICD-10-CM | POA: Insufficient documentation

## 2017-07-27 DIAGNOSIS — R0600 Dyspnea, unspecified: Secondary | ICD-10-CM | POA: Insufficient documentation

## 2017-07-27 DIAGNOSIS — R161 Splenomegaly, not elsewhere classified: Secondary | ICD-10-CM | POA: Insufficient documentation

## 2017-07-27 DIAGNOSIS — I2510 Atherosclerotic heart disease of native coronary artery without angina pectoris: Secondary | ICD-10-CM | POA: Diagnosis not present

## 2017-07-27 DIAGNOSIS — K7460 Unspecified cirrhosis of liver: Secondary | ICD-10-CM | POA: Insufficient documentation

## 2017-07-27 DIAGNOSIS — K7290 Hepatic failure, unspecified without coma: Secondary | ICD-10-CM | POA: Diagnosis not present

## 2017-07-27 DIAGNOSIS — D696 Thrombocytopenia, unspecified: Secondary | ICD-10-CM | POA: Insufficient documentation

## 2017-07-27 DIAGNOSIS — K31811 Angiodysplasia of stomach and duodenum with bleeding: Secondary | ICD-10-CM | POA: Diagnosis not present

## 2017-07-27 DIAGNOSIS — E119 Type 2 diabetes mellitus without complications: Secondary | ICD-10-CM | POA: Diagnosis not present

## 2017-07-27 DIAGNOSIS — I110 Hypertensive heart disease with heart failure: Secondary | ICD-10-CM | POA: Insufficient documentation

## 2017-07-27 DIAGNOSIS — E785 Hyperlipidemia, unspecified: Secondary | ICD-10-CM | POA: Insufficient documentation

## 2017-07-27 MED ORDER — TORSEMIDE 20 MG PO TABS: 20.0000 mg | ORAL_TABLET | Freq: Every day | ORAL | 6 refills | Status: DC

## 2017-07-27 MED ORDER — POTASSIUM CHLORIDE CRYS ER 20 MEQ PO TBCR: 30.0000 meq | EXTENDED_RELEASE_TABLET | Freq: Every day | ORAL | 3 refills | Status: DC

## 2017-07-27 NOTE — Patient Instructions (Signed)
Torsemide 20 mg (1 tab) daily  Increase Potassium 30 meq (1.5 tabs) daily  Your physician recommends that you return for lab work in: 10 days  Your physician recommends that you schedule a follow-up appointment in: 2 weeks with NP

## 2017-07-27 NOTE — Progress Notes (Signed)
Advanced Heart Failure Medication Review by a Pharmacist  Does the patient  feel that his/her medications are working for him/her?  yes  Has the patient been experiencing any side effects to the medications prescribed?  no  Does the patient measure his/her own blood pressure or blood glucose at home?  Not asked at this visit    Does the patient have any problems obtaining medications due to transportation or finances?   Yes - waiting for PA on Xifaxan (PCP is working on this)   Understanding of regimen: fair Understanding of indications: fair Potential of compliance: fair Patient understands to avoid NSAIDs. Patient understands to avoid decongestants.  Issues to address at subsequent visits: None   Pharmacist comments: Mrs Orchard presents to clinic today with her husband and no medication list or bottles. She reports that she did not take her medications this morning. They have no medication related questions or concerns at this time.     Time with patient: 10 Preparation and documentation time: 2 Total time: 12

## 2017-07-29 NOTE — Progress Notes (Signed)
PCP: Dr. Woody Seller HF Cardiology: Dr. Aundra Dubin  61 y.o. sent for CHF clinic evaluation due to progressive dyspnea.  Patient had been having exertional dypsnea for a year or so prior to initial appointment.  Echo in 3/18 showed mild LVH and mild MR with normal EF.  High resolution CT chest in 5/18 did not have evidence for interstitial lung disease.   She additionally has history of NASH with cirrhosis and variceal bleeding as well as splenomegaly, followed by Dr. Laural Golden.   She was sent for right heart cath as there was worry for possible portopulmonary hypertension.  RHC showed elevated right and left heart filling pressures with pulmonary venous hypertension.  She had been taking Lasix prn.  After the cath, I increased the Lasix to 40 mg daily.   She was admitted in 10/18 with symptomatic anemia and got 3 units PRBCs. EGD showed GAVE that was treated with APC.  She was diuresed with IV Lasix that admission.   Repeat RHC in 10/18 showed normal filling pressures with mild pulmonary hypertension.  PVR not elevated.    She was admitted again in 10/18 with acute encephalopathy.  She was on torsemide 60 mg daily when admitted.  NH3 was 85, hepatic encephalopathy suspected.  AKI also noted.  Lactulose was started.  Possible that HE was triggered by dehydration.  Torsemide was stopped.   She has been off torsemide a week now.  Weight is down 46 lbs compared to prior appointment.   She is clear mentally today.  Feels weak and fatigues easily.  No significant dyspnea.  No chest pain.  No orthopnea/PND.    Labs (7/18): K 3.9, creatinine 0.8, hgb 8.1    Labs (11/18): K 3.7, creatinine 1.03                                                                                                       PMH:  1. NASH with cirrhosis: H/o variceal bleeding.  2. Splenomegaly related to cirrhosis.  3. Sleep study 6/8 showed no significant OSA.  4. High resolution CT chest (5/18): No interstitial lung disease, +calcium in  coronaries.  5. Chronic diastolic CHF:  - Echo (0/63): EF 60-65%, mild LVH, mild MR.  Negative bubble study.  - RHC (7/18): mean RA 16, PA 64/25, mean PCWP 21, CI 4.16, PVR 2.6 WU.  - RHC (10/18): mean RA 4, PA 39/15 mean 26, mean PCWP 11, CI 6.4, PVR 1.1 WU 6. Anemia 7. Thrombocytopenia (likely related to splenomegaly).  8. Upper GI bleeding: 10/18, GAVE treated with APC.  9. Diabetes: Insulin pump.  10. Hyperlipidemia  Social History   Socioeconomic History  . Marital status: Married    Spouse name: Not on file  . Number of children: Not on file  . Years of education: Not on file  . Highest education level: Not on file  Social Needs  . Financial resource strain: Not on file  . Food insecurity - worry: Not on file  . Food insecurity - inability: Not on file  . Transportation needs - medical: Not on  file  . Transportation needs - non-medical: Not on file  Occupational History  . Occupation: retired   Tobacco Use  . Smoking status: Never Smoker  . Smokeless tobacco: Never Used  Substance and Sexual Activity  . Alcohol use: No    Alcohol/week: 0.0 oz  . Drug use: No  . Sexual activity: Not on file  Other Topics Concern  . Not on file  Social History Narrative   Lives with husband    Family History  Problem Relation Age of Onset  . Lung cancer Mother   . Diabetes Father   . Diabetes Sister   . Hypertension Sister   . Hypothyroidism Sister   . Colon cancer Brother   . Diabetes Sister   . Hypothyroidism Brother   . Healthy Daughter   . Obesity Daughter   . Hypertension Daughter    ROS: All systems reviewed and negatie except as per HPI.   Current Outpatient Medications  Medication Sig Dispense Refill  . cholecalciferol (VITAMIN D) 1000 UNITS tablet Take 1,000 Units by mouth every Monday, Wednesday, and Friday.     . flintstones complete (FLINTSTONES) 60 MG chewable tablet Chew 2 tablets daily by mouth.     . fluticasone furoate-vilanterol (BREO ELLIPTA) 200-25  MCG/INH AEPB Inhale 1 puff into the lungs daily. 1 each 3  . insulin aspart (NOVOLOG) 100 UNIT/ML FlexPen Use with given sliding scale  CBG 70-120: 0 units CBG 121-150: 1 unit CBG 151-200: 2 units CBG 201-250: 3 units CBG 251-300: 5 units CBG 301-350: 7 units CBG greater than 351: 9 units 15 mL 11  . Insulin Glargine (LANTUS SOLOSTAR) 100 UNIT/ML Solostar Pen Inject 38 Units daily at 10 pm into the skin. 15 mL 11  . Insulin Pen Needle 31G X 5 MM MISC Use with each insulin injection 100 each 12  . lactulose (CHRONULAC) 10 GM/15ML solution Take 30 mLs (20 g total) 4 (four) times daily by mouth. 240 mL 0  . levothyroxine (SYNTHROID, LEVOTHROID) 25 MCG tablet Take 25 mcg by mouth daily before breakfast.     . OXYGEN Inhale 2 L into the lungs every evening. WITH SLEEP.    Marland Kitchen pantoprazole (PROTONIX) 40 MG tablet Take 1 tablet by mouth once daily 90 tablet 3  . PARoxetine (PAXIL) 40 MG tablet Take 40 mg by mouth every morning.    . potassium chloride SA (K-DUR,KLOR-CON) 20 MEQ tablet Take 1.5 tablets (30 mEq total) daily by mouth. 45 tablet 3  . propranolol (INDERAL) 20 MG tablet Take 1 tablet (20 mg total) by mouth at bedtime as needed (for increased BP). (Patient taking differently: Take 20 mg daily by mouth. ) 30 tablet   . rosuvastatin (CRESTOR) 5 MG tablet Take 1 tablet (5 mg total) by mouth every other day. 45 tablet 3  . sucralfate (CARAFATE) 1 GM/10ML suspension Take 10 mLs (1 g total) by mouth 4 (four) times daily -  with meals and at bedtime. 420 mL 0  . rifaximin (XIFAXAN) 550 MG TABS tablet Take 1 tablet (550 mg total) 2 (two) times daily by mouth. (Patient not taking: Reported on 07/27/2017) 60 tablet 2  . torsemide (DEMADEX) 20 MG tablet Take 1 tablet (20 mg total) daily by mouth. 30 tablet 6   No current facility-administered medications for this encounter.    BP 138/76   Pulse 77   Wt 211 lb 1.9 oz (95.8 kg)   SpO2 93%   BMI 37.40 kg/m  General: NAD Neck: No  JVD, no  thyromegaly or thyroid nodule.  Lungs: Clear to auscultation bilaterally with normal respiratory effort. CV: Nondisplaced PMI.  Heart regular S1/S2, no S3/S4, no murmur.  1+ ankle edema.  No carotid bruit.  Normal pedal pulses.  Abdomen: Soft, nontender, no hepatosplenomegaly, no distention.  Skin: Intact without lesions or rashes.  Neurologic: Alert and oriented x 3.  Psych: Normal affect. Extremities: No clubbing or cyanosis.  HEENT: Normal.   Assessment/Plan: 1. Chronic diastolic CHF: RHC in 7/73 is suggestive of significant diastolic CHF.  EF was normal by echo. Repeat RHC 10/18 showed normal filling pressures on torsemide.  Torsemide was stopped with AKI.  She is not volume overloaded today but I think that she is going to need some diuretic going forwards.  - Start back on torsemide 20 mg daily and increase KCl to 30 daily.  BMET in 10 days.    2. Pulmonary hypertension: Patient is at risk for portopulmonary hypertension.   However, RHC in 7/18 and in 10/18 showed pulmonary venous hypertension (group 2).   3. Cirrhosis: NASH with splenomegaly, thrombocytopenia.   - She is on lactulose and will start rifamixin for hepatic encephalopathy.  4. CAD: Coronary calcium on CT chest in 5/18.  No chest pain.  - In absence of chest pain, would not do stress test or cath => poor candidate for invasive cardiac evaluation and stent placement with very low platelets.  - Continue Crestor 5 mg daily.   5. Anemia: Followed by her GI MD.  Recent upper GI bleeding from GAVE. 6. Thrombocytopenia: Likely related to cirrhosis and splenomegaly.   Followup in 2 weeks with NP/PA.   Loralie Champagne 07/29/2017

## 2017-07-31 LAB — DIFFERENTIAL
Band Neutrophils: 0 %
Basophils Relative: 0 %
Blasts: 0 %
Eosinophils Relative: 5 %
Lymphocytes Relative: 23 %
Metamyelocytes Relative: 0 %
Monocytes Relative: 6 %
Myelocytes: 0 %
Neutrophils Relative %: 66 %
Other: 0 %
Promyelocytes Absolute: 0 %
nRBC: 0 /100 WBC

## 2017-08-01 ENCOUNTER — Encounter (INDEPENDENT_AMBULATORY_CARE_PROVIDER_SITE_OTHER): Payer: Self-pay | Admitting: Internal Medicine

## 2017-08-01 DIAGNOSIS — Z299 Encounter for prophylactic measures, unspecified: Secondary | ICD-10-CM | POA: Diagnosis not present

## 2017-08-01 DIAGNOSIS — K7290 Hepatic failure, unspecified without coma: Secondary | ICD-10-CM | POA: Diagnosis not present

## 2017-08-01 DIAGNOSIS — K7460 Unspecified cirrhosis of liver: Secondary | ICD-10-CM | POA: Diagnosis not present

## 2017-08-01 DIAGNOSIS — I8500 Esophageal varices without bleeding: Secondary | ICD-10-CM | POA: Diagnosis not present

## 2017-08-01 DIAGNOSIS — Z6837 Body mass index (BMI) 37.0-37.9, adult: Secondary | ICD-10-CM | POA: Diagnosis not present

## 2017-08-01 DIAGNOSIS — I2729 Other secondary pulmonary hypertension: Secondary | ICD-10-CM | POA: Diagnosis not present

## 2017-08-01 DIAGNOSIS — E1165 Type 2 diabetes mellitus with hyperglycemia: Secondary | ICD-10-CM | POA: Diagnosis not present

## 2017-08-06 ENCOUNTER — Ambulatory Visit (HOSPITAL_COMMUNITY)
Admission: RE | Admit: 2017-08-06 | Discharge: 2017-08-06 | Disposition: A | Discharge status: Home / Self Care | Payer: PPO | Source: Ambulatory Visit | Attending: Cardiology | Admitting: Cardiology

## 2017-08-06 DIAGNOSIS — I5032 Chronic diastolic (congestive) heart failure: Secondary | ICD-10-CM | POA: Insufficient documentation

## 2017-08-06 LAB — BASIC METABOLIC PANEL
Anion gap: 6 (ref 5–15)
BUN: 9 mg/dL (ref 6–20)
CO2: 26 mmol/L (ref 22–32)
Calcium: 8.4 mg/dL — ABNORMAL LOW (ref 8.9–10.3)
Chloride: 106 mmol/L (ref 101–111)
Creatinine, Ser: 0.93 mg/dL (ref 0.44–1.00)
GFR calc Af Amer: >60 mL/min (ref >60)
GFR calc non Af Amer: >60 mL/min (ref >60)
Glucose, Bld: 204 mg/dL — ABNORMAL HIGH (ref 65–99)
Potassium: 3.5 mmol/L (ref 3.5–5.1)
Sodium: 138 mmol/L (ref 135–145)

## 2017-08-07 ENCOUNTER — Ambulatory Visit (INDEPENDENT_AMBULATORY_CARE_PROVIDER_SITE_OTHER): Payer: PPO | Admitting: Internal Medicine

## 2017-08-07 ENCOUNTER — Ambulatory Visit
Admission: RE | Admit: 2017-08-07 | Discharge: 2017-08-07 | Disposition: A | Discharge status: Home / Self Care | Payer: MEDICARE | Attending: Gastroenterology | Admitting: Gastroenterology

## 2017-08-07 DIAGNOSIS — K7460 Unspecified cirrhosis of liver: Principal | ICD-10-CM

## 2017-08-07 DIAGNOSIS — K7290 Hepatic failure, unspecified without coma: Secondary | ICD-10-CM

## 2017-08-07 DIAGNOSIS — Z794 Long term (current) use of insulin: Secondary | ICD-10-CM | POA: Diagnosis not present

## 2017-08-07 DIAGNOSIS — K760 Fatty (change of) liver, not elsewhere classified: Secondary | ICD-10-CM | POA: Diagnosis not present

## 2017-08-07 DIAGNOSIS — Z79899 Other long term (current) drug therapy: Secondary | ICD-10-CM | POA: Diagnosis not present

## 2017-08-07 DIAGNOSIS — D500 Iron deficiency anemia secondary to blood loss (chronic): Secondary | ICD-10-CM | POA: Diagnosis not present

## 2017-08-07 DIAGNOSIS — M810 Age-related osteoporosis without current pathological fracture: Secondary | ICD-10-CM | POA: Diagnosis not present

## 2017-08-07 DIAGNOSIS — K766 Portal hypertension: Secondary | ICD-10-CM | POA: Diagnosis not present

## 2017-08-07 DIAGNOSIS — K3189 Other diseases of stomach and duodenum: Secondary | ICD-10-CM | POA: Diagnosis not present

## 2017-08-07 DIAGNOSIS — E669 Obesity, unspecified: Secondary | ICD-10-CM | POA: Diagnosis not present

## 2017-08-07 DIAGNOSIS — E119 Type 2 diabetes mellitus without complications: Secondary | ICD-10-CM | POA: Diagnosis not present

## 2017-08-07 DIAGNOSIS — I10 Essential (primary) hypertension: Secondary | ICD-10-CM | POA: Diagnosis not present

## 2017-08-07 DIAGNOSIS — F329 Major depressive disorder, single episode, unspecified: Secondary | ICD-10-CM | POA: Diagnosis not present

## 2017-08-07 NOTE — Discharge Summary (Signed)
Physician Discharge Summary  Maria [name] PJK:[id number] DOB: 12-15-1955 DOA: 07/18/2017  PCP: Glenda Chroman, MD  Admit date: 07/18/2017 Discharge date: 07/22/2017  Time spent: 45 minutes  Recommendations for Outpatient Follow-up:  -Will be discharged home today. -Advised  To follow up with Dr. Laural Golden in 1 week.  Discharge Diagnoses:  Principal Problem:   Hyperammonemia ([place]) Active Problems:   Diabetes mellitus (HCC)   Iron deficiency anemia   GERD (gastroesophageal reflux disease)   Thrombocytopenia (HCC)   Hepatic cirrhosis (HCC)   Chronic diastolic heart failure (HCC)   CAD (coronary artery disease)   Hypothyroidism   Metabolic encephalopathy   Discharge Condition: Stable and improved  Filed Weights   07/20/17 1615 07/21/17 0551 07/22/17 0658  Weight: 92.1 kg (203 lb) 93.7 kg (206 lb 8 oz) 93.5 kg (206 lb 3.2 oz)    History of present illness:  As per Dr. Olevia Bowens on 10/31: Maria [name] is a 61 y.o. female with medical history significant of anemia, chronic diastolic CHF, non alcoholic liver cirrhosis, anxiety, depression, esophageal varices, history of number initial esophageal bleed, eye hemorrhage, fibromyalgia, hyperlipidemia, hypertension, hypothyroidism, osteoarthritis, osteoporosis who was brought to the emergency department via EMS due to altered mental status. Her husband stated that she has been somnolent most of the day, but had a very difficult time being awakened yesterday late in the evening so he subsequently called EMS. The patient is more easily arousable now. She denies headache, chest pain, dizziness, palpitations, abdominal pain, nausea, emesis, diarrhea, melena or hematochezia. No dysuria, no frequency, oliguria or hematuria. Denies polyuria, polydipsia or blurred vision.  ED Course: Initial vital signs temperature 97.55F, pulse 74, respirations 16 and blood pressure 8573 and O2 sat 97% on room air. She was evaluated by telephone neurologist  along with Dr. Tomi Bamberger. No focal deficits were seen. MRI of the head was recommended given the questionable finding on right temporal lobe on CT scan of the head. Please see images and full radiology report for further detail.  Her workup showed normal white count, hemoglobin of 9.7 g/dL and platelets of 67. PT was 16.4, INR 133 and PTT 32 seconds. Her CMP showed a potassium of 3.4 mmol/L, glucose of 135 mg/dL, albumin 2.9 g/dL and bilirubin of 1.6 mg/dL. Her ammonia level was 85. EtOH level was negative. Urinalysis and U tox were negative.    Hospital Course:   Acute metabolic encephalopathy with hyperammonemia -Thought to be hepatic encephalopathy. -She has been started on lactulose which should be titrated up to having 3 loose bowel movements a day. -Her confusion seems to be improving/pretty much at baseline. -She has a history of cirrhosis due to [facility], patient has been referred by her primary GI to [facility] for transplant evaluation. -Dr. Laural Golden believes that hepatic encephalopathy may have been triggered by dehydration and has recommended cessation of diuretic therapy for now. -Advised close follow up with cardiology as I suspect she might seem some diuretics in the near future given her h/o diastolic CHF.  Type 1 diabetes -Was on insulin pump prior to admission, however an A1c of 4.2% would indicate that she has had some periods of hypoglycemia. -Given her confusion insulin pump has been discontinued. -For now I believe she should be discharged on a basal bolus regimen without pump for now until she follows up with her outpatient endocrinologist.  Hypokalemia -Suspect due to diarrhea from lactulose. -Replaced orally.  Chronic diastolic CHF -No sign of decompensation at present, in  fact if anything may have been somewhat over diuresed during her prior hospitalization.  History of coronary artery disease -Stable, no chest pain.  Continue beta-blocker and  statin.  Hypothyroidism -Continue home dose of Synthroid.     Procedures:  None   Consultations:  GI  Discharge Instructions   Allergies as of 07/22/2017      Reactions   Feraheme [ferumoxytol]    Patient has severe back and chest pain when receiving FERAHEME infusions.   Ultram [tramadol Hcl]    WEAKNESS      Medication List    STOP taking these medications   insulin lispro 100 UNIT/ML injection Commonly known as:  HUMALOG   insulin pump Soln   metolazone 2.5 MG tablet Commonly known as:  ZAROXOLYN   spironolactone 100 MG tablet Commonly known as:  ALDACTONE   torsemide 20 MG tablet Commonly known as:  DEMADEX     TAKE these medications   cholecalciferol 1000 units tablet Commonly known as:  VITAMIN D Take 1,000 Units by mouth every Monday, Wednesday, and Friday.   flintstones complete 60 MG chewable tablet Chew 2 tablets daily by mouth.   fluticasone furoate-vilanterol 200-25 MCG/INH Aepb Commonly known as:  BREO ELLIPTA Inhale 1 puff into the lungs daily.   insulin aspart 100 UNIT/ML FlexPen Commonly known as:  NOVOLOG Use with given sliding scale  CBG 70-120: 0 units CBG 121-150: 1 unit CBG 151-200: 2 units CBG 201-250: 3 units CBG 251-300: 5 units CBG 301-350: 7 units CBG greater than 351: 9 units   Insulin Glargine 100 UNIT/ML Solostar Pen Commonly known as:  LANTUS SOLOSTAR Inject 38 Units daily at 10 pm into the skin.   Insulin Pen Needle 31G X 5 MM Misc Use with each insulin injection   lactulose 10 GM/15ML solution Commonly known as:  CHRONULAC Take 30 mLs (20 g total) 4 (four) times daily by mouth.   levothyroxine 25 MCG tablet Commonly known as:  SYNTHROID, LEVOTHROID Take 25 mcg by mouth daily before breakfast.   OXYGEN Inhale 2 L into the lungs every evening. WITH SLEEP.   pantoprazole 40 MG tablet Commonly known as:  PROTONIX Take 1 tablet by mouth once daily   PARoxetine 40 MG tablet Commonly known as:   PAXIL Take 40 mg by mouth every morning.   propranolol 20 MG tablet Commonly known as:  INDERAL Take 1 tablet (20 mg total) by mouth at bedtime as needed (for increased BP). What changed:  when to take this   rifaximin 550 MG Tabs tablet Commonly known as:  XIFAXAN Take 1 tablet (550 mg total) 2 (two) times daily by mouth.   rosuvastatin 5 MG tablet Commonly known as:  CRESTOR Take 1 tablet (5 mg total) by mouth every other day.   sucralfate 1 GM/10ML suspension Commonly known as:  CARAFATE Take 10 mLs (1 g total) by mouth 4 (four) times daily -  with meals and at bedtime.      Allergies  Allergen Reactions  . Feraheme [Ferumoxytol]     Patient has severe back and chest pain when receiving FERAHEME infusions.  Marland Kitchen Ultram [Tramadol Hcl]     WEAKNESS   Follow-up Information    Rehman, Mechele Dawley, MD. Schedule an appointment as soon as possible for a visit in 2 week(s).   Specialty:  Gastroenterology Contact information: [address], [address] [place] [address] [phone]        Jacelyn Pi, MD. Schedule an appointment as soon  as possible for a visit in 1 week(s).   Specialty:  Endocrinology Contact information: [address] [place] [address] [phone]            The results of significant diagnostics from this hospitalization (including imaging, microbiology, ancillary and laboratory) are listed below for reference.    Significant Diagnostic Studies: Ct Head Wo Contrast  Result Date: 07/18/2017 CLINICAL DATA:  Altered level of consciousness. EXAM: CT HEAD WITHOUT CONTRAST TECHNIQUE: Contiguous axial images were obtained from the base of the skull through the vertex without intravenous contrast. COMPARISON:  None. FINDINGS: Brain: Equivocal loss of gray-white differentiation in the right temporal lobe versus artifact. New acute hemorrhage. No hydrocephalus. No midline shift or mass effect. Vascular: Atherosclerosis of skullbase  vasculature without hyperdense vessel or abnormal calcification. Skull: No fracture or focal lesion. Sinuses/Orbits: Near complete opacification of the left maxillary sinus, left sphenoid sinus, moderate left ethmoid air cell opacification and fluid level in the left frontal sinus. Minimal opacification of left mastoid air cells. Visualized orbits are unremarkable. Other: None. IMPRESSION: 1. Equivocal loss of gray-white differentiation in the right temporal lobe which can be seen with acute ischemia, recommend MRI for further evaluation. 2. Left paranasal sinus disease with near complete opacification of the maxillary sinus, sphenoid sinus, and mucosal thickening of the frontal sinus and ethmoid air cells. Electronically Signed   By: Jeb Levering M.D.   On: 07/18/2017 02:33   Mr Brain Wo Contrast  Result Date: 07/18/2017 CLINICAL DATA:  Altered mental status for 2 days EXAM: MRI HEAD WITHOUT CONTRAST TECHNIQUE: Multiplanar, multiecho pulse sequences of the brain and surrounding structures were obtained without intravenous contrast. COMPARISON:  Head CT from earlier today FINDINGS: Brain: No acute infarction or evidence of hemorrhage, hydrocephalus, extra-axial collection or mass lesion. T1 hyperintensity within globus pallidi attributed to the patient's cirrhosis. Normal brain volume. Vascular: No detectable abnormality. Skull and upper cervical spine: Hypointense appearance of marrow which is nonspecific. No focal mass is seen. Sinuses/Orbits: Left maxillary, ethmoid, and frontal sinusitis due to middle meatus obstruction. There are scattered fluid levels. Other: Significantly motion degraded study which could obscure pathology. IMPRESSION: 1. Significantly motion degraded study which could obscure pathology. Fortunately, diffusion imaging is diagnostic and negative for infarct, as questioned on preceding head CT. 2. Left middle meatus obstruction with sinusitis, potentially acute. 3. Diffusely  hypointense marrow signal. Please correlate with CBC and iron studies. 4. Altered signal in the basal ganglia likely from patient's cirrhosis. Electronically Signed   By: Monte Fantasia M.D.   On: 07/18/2017 09:27    Microbiology: No results found for this or any previous visit (from the past 240 hour(s)).   Labs: Basic Metabolic Panel: Recent Labs  Lab 08/06/17 1413  NA 138  K 3.5  CL 106  CO2 26  GLUCOSE 204*  BUN 9  CREATININE 0.93  CALCIUM 8.4*   Liver Function Tests: No results for input(s): AST, ALT, ALKPHOS, BILITOT, PROT, ALBUMIN in the last 168 hours. No results for input(s): LIPASE, AMYLASE in the last 168 hours. No results for input(s): AMMONIA in the last 168 hours. CBC: No results for input(s): WBC, NEUTROABS, HGB, HCT, MCV, PLT in the last 168 hours. Cardiac Enzymes: No results for input(s): CKTOTAL, CKMB, CKMBINDEX, TROPONINI in the last 168 hours. BNP: BNP (last 3 results) Recent Labs    04/24/17 1450  BNP 59.1    ProBNP (last 3 results) No results for input(s): PROBNP in the last 8760 hours.  CBG: No results for input(s): GLUCAP in the last 168 hours.     Signed:  Lelon Frohlich  [facility] Pager: [phone] 08/07/2017, 6:40 PM

## 2017-08-08 ENCOUNTER — Ambulatory Visit (HOSPITAL_COMMUNITY)
Admission: RE | Admit: 2017-08-08 | Discharge: 2017-08-08 | Disposition: A | Discharge status: Home / Self Care | Payer: PPO | Source: Ambulatory Visit | Attending: Internal Medicine | Admitting: Internal Medicine

## 2017-08-08 VITALS — BP 130/72 | HR 87 | Wt 218.4 lb

## 2017-08-08 DIAGNOSIS — K7460 Unspecified cirrhosis of liver: Secondary | ICD-10-CM | POA: Diagnosis not present

## 2017-08-08 DIAGNOSIS — E785 Hyperlipidemia, unspecified: Secondary | ICD-10-CM | POA: Diagnosis not present

## 2017-08-08 DIAGNOSIS — I110 Hypertensive heart disease with heart failure: Secondary | ICD-10-CM | POA: Insufficient documentation

## 2017-08-08 DIAGNOSIS — I2510 Atherosclerotic heart disease of native coronary artery without angina pectoris: Secondary | ICD-10-CM | POA: Insufficient documentation

## 2017-08-08 DIAGNOSIS — Z800 Family history of malignant neoplasm of digestive organs: Secondary | ICD-10-CM | POA: Insufficient documentation

## 2017-08-08 DIAGNOSIS — N179 Acute kidney failure, unspecified: Secondary | ICD-10-CM | POA: Insufficient documentation

## 2017-08-08 DIAGNOSIS — D649 Anemia, unspecified: Secondary | ICD-10-CM | POA: Insufficient documentation

## 2017-08-08 DIAGNOSIS — Z801 Family history of malignant neoplasm of trachea, bronchus and lung: Secondary | ICD-10-CM | POA: Diagnosis not present

## 2017-08-08 DIAGNOSIS — K7581 Nonalcoholic steatohepatitis (NASH): Secondary | ICD-10-CM | POA: Diagnosis not present

## 2017-08-08 DIAGNOSIS — K31811 Angiodysplasia of stomach and duodenum with bleeding: Secondary | ICD-10-CM | POA: Diagnosis not present

## 2017-08-08 DIAGNOSIS — R161 Splenomegaly, not elsewhere classified: Secondary | ICD-10-CM | POA: Diagnosis not present

## 2017-08-08 DIAGNOSIS — I272 Pulmonary hypertension, unspecified: Secondary | ICD-10-CM | POA: Insufficient documentation

## 2017-08-08 DIAGNOSIS — Z8249 Family history of ischemic heart disease and other diseases of the circulatory system: Secondary | ICD-10-CM | POA: Diagnosis not present

## 2017-08-08 DIAGNOSIS — D696 Thrombocytopenia, unspecified: Secondary | ICD-10-CM | POA: Diagnosis not present

## 2017-08-08 DIAGNOSIS — K7290 Hepatic failure, unspecified without coma: Secondary | ICD-10-CM | POA: Diagnosis not present

## 2017-08-08 DIAGNOSIS — Z833 Family history of diabetes mellitus: Secondary | ICD-10-CM | POA: Insufficient documentation

## 2017-08-08 DIAGNOSIS — Z794 Long term (current) use of insulin: Secondary | ICD-10-CM | POA: Insufficient documentation

## 2017-08-08 DIAGNOSIS — I5032 Chronic diastolic (congestive) heart failure: Secondary | ICD-10-CM

## 2017-08-08 DIAGNOSIS — Z79899 Other long term (current) drug therapy: Secondary | ICD-10-CM | POA: Insufficient documentation

## 2017-08-08 DIAGNOSIS — E119 Type 2 diabetes mellitus without complications: Secondary | ICD-10-CM | POA: Insufficient documentation

## 2017-08-08 NOTE — Patient Instructions (Signed)
Take Trosemide 40 mg (2 Tablet) Daily for 2 days only, then resume 20 mg (1 Tablet) daily.  Follow up with Dr. Aundra Dubin in 2 Months

## 2017-08-08 NOTE — Progress Notes (Signed)
PCP: Dr. Woody Seller HF Cardiology: Dr. Aundra Dubin  61 y.o. sent for CHF clinic evaluation due to progressive dyspnea.  Patient had been having exertional dypsnea for a year or so prior to initial appointment.  Echo in 3/18 showed mild LVH and mild MR with normal EF.  High resolution CT chest in 5/18 did not have evidence for interstitial lung disease.   She additionally has history of NASH with cirrhosis and variceal bleeding as well as splenomegaly, followed by Dr. Laural Golden.   She was sent for right heart cath as there was worry for possible portopulmonary hypertension.  RHC showed elevated right and left heart filling pressures with pulmonary venous hypertension.  She had been taking Lasix prn.  After the cath, I increased the Lasix to 40 mg daily.   She was admitted in 10/18 with symptomatic anemia and got 3 units PRBCs. EGD showed GAVE that was treated with APC.  She was diuresed with IV Lasix that admission.   Repeat RHC in 10/18 showed normal filling pressures with mild pulmonary hypertension.  PVR not elevated.    She was admitted again in 10/18 with acute encephalopathy.  She was on torsemide 60 mg daily when admitted.  NH3 was 85, hepatic encephalopathy suspected.  AKI also noted.  Lactulose was started.  Possible that HE was triggered by dehydration.  Torsemide was stopped.   Today she returns for 2 week follow up. Last visit torsemide was restarted. Overall feeling ok. Ongoing mild dyspnea with exertion. Denies PND/Orthopnea. Weight at home 211-215 pounds. Says she didn't take torsemide for 4 days. She then restarted 20 torsemide daily. No bleeding problems. Tries to follow low salt diet and limit fluid intake to < 2 liters per day. Taking all medications.   Labs (7/18): K 3.9, creatinine 0.8, hgb 8.1    Labs (11/18): K 3.7, creatinine 1.03                                                                                                       PMH:  1. NASH with cirrhosis: H/o variceal bleeding.   2. Splenomegaly related to cirrhosis.  3. Sleep study 6/8 showed no significant OSA.  4. High resolution CT chest (5/18): No interstitial lung disease, +calcium in coronaries.  5. Chronic diastolic CHF:  - Echo (2/67): EF 60-65%, mild LVH, mild MR.  Negative bubble study.  - RHC (7/18): mean RA 16, PA 64/25, mean PCWP 21, CI 4.16, PVR 2.6 WU.  - RHC (10/18): mean RA 4, PA 39/15 mean 26, mean PCWP 11, CI 6.4, PVR 1.1 WU 6. Anemia 7. Thrombocytopenia (likely related to splenomegaly).  8. Upper GI bleeding: 10/18, GAVE treated with APC.  9. Diabetes: Insulin pump.  10. Hyperlipidemia  Social History   Socioeconomic History  . Marital status: Married    Spouse name: Not on file  . Number of children: Not on file  . Years of education: Not on file  . Highest education level: Not on file  Social Needs  . Financial resource strain: Not on file  . Food insecurity -  worry: Not on file  . Food insecurity - inability: Not on file  . Transportation needs - medical: Not on file  . Transportation needs - non-medical: Not on file  Occupational History  . Occupation: retired   Tobacco Use  . Smoking status: Never Smoker  . Smokeless tobacco: Never Used  Substance and Sexual Activity  . Alcohol use: No    Alcohol/week: 0.0 oz  . Drug use: No  . Sexual activity: Not on file  Other Topics Concern  . Not on file  Social History Narrative   Lives with husband    Family History  Problem Relation Age of Onset  . Lung cancer Mother   . Diabetes Father   . Diabetes Sister   . Hypertension Sister   . Hypothyroidism Sister   . Colon cancer Brother   . Diabetes Sister   . Hypothyroidism Brother   . Healthy Daughter   . Obesity Daughter   . Hypertension Daughter    ROS: All systems reviewed and negatie except as per HPI.   Current Outpatient Medications  Medication Sig Dispense Refill  . cholecalciferol (VITAMIN D) 1000 UNITS tablet Take 1,000 Units by mouth every Monday, Wednesday,  and Friday.     . flintstones complete (FLINTSTONES) 60 MG chewable tablet Chew 2 tablets daily by mouth.     . fluticasone furoate-vilanterol (BREO ELLIPTA) 200-25 MCG/INH AEPB Inhale 1 puff into the lungs daily. 1 each 3  . insulin aspart (NOVOLOG) 100 UNIT/ML FlexPen Use with given sliding scale  CBG 70-120: 0 units CBG 121-150: 1 unit CBG 151-200: 2 units CBG 201-250: 3 units CBG 251-300: 5 units CBG 301-350: 7 units CBG greater than 351: 9 units 15 mL 11  . Insulin Glargine (LANTUS SOLOSTAR) 100 UNIT/ML Solostar Pen Inject 38 Units daily at 10 pm into the skin. 15 mL 11  . Insulin Pen Needle 31G X 5 MM MISC Use with each insulin injection 100 each 12  . lactulose (CHRONULAC) 10 GM/15ML solution Take 30 mLs (20 g total) 4 (four) times daily by mouth. 240 mL 0  . levothyroxine (SYNTHROID, LEVOTHROID) 25 MCG tablet Take 25 mcg by mouth daily before breakfast.     . OXYGEN Inhale 2 L into the lungs every evening. WITH SLEEP.    Marland Kitchen pantoprazole (PROTONIX) 40 MG tablet Take 1 tablet by mouth once daily 90 tablet 3  . PARoxetine (PAXIL) 40 MG tablet Take 40 mg by mouth every morning.    . potassium chloride SA (K-DUR,KLOR-CON) 20 MEQ tablet Take 1.5 tablets (30 mEq total) daily by mouth. 45 tablet 3  . propranolol (INDERAL) 20 MG tablet Take 1 tablet (20 mg total) by mouth at bedtime as needed (for increased BP). (Patient taking differently: Take 20 mg daily by mouth. ) 30 tablet   . rifaximin (XIFAXAN) 550 MG TABS tablet Take 1 tablet (550 mg total) 2 (two) times daily by mouth. 60 tablet 2  . rosuvastatin (CRESTOR) 5 MG tablet Take 1 tablet (5 mg total) by mouth every other day. 45 tablet 3  . sucralfate (CARAFATE) 1 GM/10ML suspension Take 10 mLs (1 g total) by mouth 4 (four) times daily -  with meals and at bedtime. 420 mL 0  . torsemide (DEMADEX) 20 MG tablet Take 1 tablet (20 mg total) daily by mouth. 30 tablet 6   No current facility-administered medications for this encounter.    BP  130/72 (BP Location: Left Arm, Patient Position: Sitting, Cuff Size: Large)  Pulse 87   Wt 218 lb 6.4 oz (99.1 kg)   SpO2 98%   BMI 38.69 kg/m   General:  Appear pale. No resp difficulty HEENT: normal Neck: supple. JVP ~10. Carotids 2+ bilat; no bruits. No lymphadenopathy or thryomegaly appreciated. Cor: PMI nondisplaced. Regular rate & rhythm. No rubs, gallops or murmurs. Lungs: clear Abdomen: soft, nontender, nondistended. No hepatosplenomegaly. No bruits or masses. Good bowel sounds. Extremities: no cyanosis, clubbing, rash, R and LLE 1-2+ edema Neuro: alert & orientedx3, cranial nerves grossly intact. moves all 4 extremities w/o difficulty. Affect pleasant   Assessment/Plan: 1. Chronic diastolic CHF: RHC in 1/74 is suggestive of significant diastolic CHF.  EF was normal by echo. Repeat RHC 10/18 showed normal filling pressures on torsemide.  Torsemide was stopped with AKI.   Volume status elevated. Increase torsemide to 40 mg daily x2 days then back to 20 mg daily.  - I reviewed BMET from 08/06/2017.     2. Pulmonary hypertension: Patient is at risk for portopulmonary hypertension.   However, RHC in 7/18 and in 10/18 showed pulmonary venous hypertension (group 2).   3. Cirrhosis: NASH with splenomegaly, thrombocytopenia.   - She is on lactulose and and rifamixin for hepatic encephalopathy.  4. CAD: Coronary calcium on CT chest in 5/18.  No chest pain.  - No s/s ischemia.  - No chest pain so would not do stress test or cath => poor candidate for invasive cardiac evaluation and stent placement with very low platelets.  - Continue Crestor 5 mg daily.   5. Anemia: Followed by her GI MD.  Recent upper GI bleeding from GAVE. 6. Thrombocytopenia: Likely related to cirrhosis and splenomegaly.   Follow up in 2 months with Dr Aundra Dubin.   Maria Witts NP-C  08/08/2017

## 2017-08-13 ENCOUNTER — Encounter (INDEPENDENT_AMBULATORY_CARE_PROVIDER_SITE_OTHER): Payer: Self-pay | Admitting: Internal Medicine

## 2017-08-14 ENCOUNTER — Telehealth (INDEPENDENT_AMBULATORY_CARE_PROVIDER_SITE_OTHER): Payer: Self-pay | Admitting: Internal Medicine

## 2017-08-14 ENCOUNTER — Encounter (INDEPENDENT_AMBULATORY_CARE_PROVIDER_SITE_OTHER): Payer: Self-pay | Admitting: *Deleted

## 2017-08-14 ENCOUNTER — Ambulatory Visit (INDEPENDENT_AMBULATORY_CARE_PROVIDER_SITE_OTHER): Payer: PPO | Admitting: Internal Medicine

## 2017-08-14 ENCOUNTER — Encounter (INDEPENDENT_AMBULATORY_CARE_PROVIDER_SITE_OTHER): Payer: Self-pay | Admitting: Internal Medicine

## 2017-08-14 ENCOUNTER — Other Ambulatory Visit (INDEPENDENT_AMBULATORY_CARE_PROVIDER_SITE_OTHER): Payer: Self-pay | Admitting: Internal Medicine

## 2017-08-14 VITALS — BP 170/70 | HR 96 | Temp 98.0°F | Ht 63.0 in | Wt 219.4 lb

## 2017-08-14 DIAGNOSIS — K7290 Hepatic failure, unspecified without coma: Secondary | ICD-10-CM | POA: Insufficient documentation

## 2017-08-14 DIAGNOSIS — K7460 Unspecified cirrhosis of liver: Secondary | ICD-10-CM | POA: Diagnosis not present

## 2017-08-14 DIAGNOSIS — K760 Fatty (change of) liver, not elsewhere classified: Secondary | ICD-10-CM | POA: Diagnosis not present

## 2017-08-14 DIAGNOSIS — K7682 Hepatic encephalopathy: Secondary | ICD-10-CM

## 2017-08-14 NOTE — Telephone Encounter (Signed)
err

## 2017-08-14 NOTE — Progress Notes (Addendum)
Subjective:     Patient ID: Maria [name], female   DOB: Apr 17, 1956, 61 y.o.   MRN: [id number]  HPI Here today for follow after recent admission to [facility] for Hepatic encephalopathy In November of this year. She was also hospitalized in October with a hemoglobin of 5.8 and fluid overload.  Work up revealed active bleeding from gastric antral vascular ectasia.  Treated with APC. She received 3 units of PRBC in October.  On admission ibn November her ammonia level elevated at 101.   She was unable to afford the Xifaxin.  Hx of NAFLD/Cirrhosis.  Has undergone multiple EGD for esophageal varices.   On 07/28/2017 wt 218. Today her weight is 219.4 She tells me her hemoglobin is 8.2.   She has not had any diarrhea. She has had to use fleet enemas.  She is having about 1 stool a day. She says she is having black stools.  She has had some confusion.  She is not eating well. She is eating Yogurt. Blood sugars are over 200.   Hx of IDA.  Recently was evaluated by Dr. Monica Martinez 08/07/2017 Recommends MRI abdomen with and without contrast and Bone density.   07/2017 H and H 9.2 and 27.6. Platelet ct not documented. AFP 3.58, IgG 1578, PT/INR 17.4 and 1.53, Total bili 1.9, AST 34, ALT 33, ALP 127    Underwent a cardiac cath 07/12/2017: Conclusion   1. Normal left and right heart filling pressures.  2. Borderline elevated PA pressure.  3. High cardiac output.          Review of Systems Past Medical History:  Diagnosis Date  . Anemia   . CHF (congestive heart failure) ([place])   . Cirrhosis ([place])   . Depression   . Diabetes mellitus   . Esophageal bleed, non-variceal   . Eye hemorrhage    Behind left eye  . Fibromyalgia   . Hypercholesteremia   . Hypertension   . Hypothyroidism   . NAFLD (nonalcoholic fatty liver disease)   . Osteoarthrosis   . Osteoporosis   . PONV (postoperative nausea and vomiting)   . UTI (urinary tract infection) 11/12 end of the month   Patient feels that she  passed a kidney stone at that time    Past Surgical History:  Procedure Laterality Date  . APPENDECTOMY  1980  . BILATERAL SALPINGOOPHORECTOMY    . CHOLECYSTECTOMY    . COLONOSCOPY  03/15/2011  . COLONOSCOPY N/A 03/24/2016   Procedure: COLONOSCOPY;  Surgeon: [name], MD;  [facility];  Service: Endoscopy;  Laterality: N/A;  855  . ESOPHAGEAL BANDING  04/24/2012   Procedure: ESOPHAGEAL BANDING;  Surgeon: [name], MD;  [facility];  Service: Endoscopy;  Laterality: N/A;  . Esophageal BANDING  08/03/2012   [facility] [place] [facility] [place]  09/24/2012   Procedure: ESOPHAGEAL BANDING;  Surgeon: [name], MD;  Location: [facility];  Service: Endoscopy;  Laterality: N/A;  Banding x 3  . ESOPHAGEAL BANDING N/A 01/29/2013   Procedure: ESOPHAGEAL BANDING;  Surgeon: [name], MD;  [facility];  Service: Endoscopy;  Laterality: N/A;  . ESOPHAGEAL BANDING N/A 06/19/2014   Procedure: ESOPHAGEAL BANDING;  Surgeon: [name], MD;  [facility];  Service: Endoscopy;  Laterality: N/A;  . ESOPHAGEAL BANDING N/A 01/05/2016   Procedure: ESOPHAGEAL BANDING;  Surgeon: [name], MD;  [facility];  Service: Endoscopy;  Laterality: N/A;  . ESOPHAGEAL BANDING N/A 08/03/2016   Procedure: ESOPHAGEAL BANDING;  Surgeon: [name], MD;  [facility];  Service: Endoscopy;  Laterality: N/A;  . ESOPHAGEAL BANDING N/A 05/02/2017   Procedure: ESOPHAGEAL BANDING;  Surgeon: [name], MD;  [facility];  Service: Endoscopy;  Laterality: N/A;  . ESOPHAGOGASTRODUODENOSCOPY  04/24/2012   Procedure: ESOPHAGOGASTRODUODENOSCOPY (EGD);  Surgeon: [name], MD;  [facility];  Service: Endoscopy;  Laterality: N/A;  300  . ESOPHAGOGASTRODUODENOSCOPY N/A 01/29/2013   Procedure: ESOPHAGOGASTRODUODENOSCOPY (EGD);  Surgeon: [name], MD;  [facility];   Service: Endoscopy;  Laterality: N/A;  1200  . ESOPHAGOGASTRODUODENOSCOPY N/A 05/22/2013   Procedure: ESOPHAGOGASTRODUODENOSCOPY (EGD);  Surgeon: [name], MD;  [facility];  Service: Endoscopy;  Laterality: N/A;  1:55  . ESOPHAGOGASTRODUODENOSCOPY N/A 12/31/2013   Procedure: ESOPHAGOGASTRODUODENOSCOPY (EGD);  Surgeon: [name], MD;  [facility];  Service: Endoscopy;  Laterality: N/A;  1200  . ESOPHAGOGASTRODUODENOSCOPY N/A 06/19/2014   Procedure: ESOPHAGOGASTRODUODENOSCOPY (EGD);  Surgeon: [name], MD;  [facility];  Service: Endoscopy;  Laterality: N/A;  1055  . ESOPHAGOGASTRODUODENOSCOPY N/A 01/15/2015   Procedure: ESOPHAGOGASTRODUODENOSCOPY (EGD);  Surgeon: [name], MD;  [facility];  Service: Endoscopy;  Laterality: N/A;  730 - moved to 9:45 - moved to 1250-Ann notified pt  . ESOPHAGOGASTRODUODENOSCOPY N/A 06/30/2015   Procedure: ESOPHAGOGASTRODUODENOSCOPY (EGD);  Surgeon: [name], MD;  [facility];  Service: Endoscopy;  Laterality: N/A;  1200  . ESOPHAGOGASTRODUODENOSCOPY N/A 01/05/2016   Procedure: ESOPHAGOGASTRODUODENOSCOPY (EGD);  Surgeon: [name], MD;  [facility];  Service: Endoscopy;  Laterality: N/A;  830  . ESOPHAGOGASTRODUODENOSCOPY N/A 08/03/2016   Procedure: ESOPHAGOGASTRODUODENOSCOPY (EGD);  Surgeon: [name], MD;  [facility];  Service: Endoscopy;  Laterality: N/A;  930  . ESOPHAGOGASTRODUODENOSCOPY N/A 05/02/2017   Procedure: ESOPHAGOGASTRODUODENOSCOPY (EGD);  Surgeon: [name], MD;  [facility];  Service: Endoscopy;  Laterality: N/A;  12:00  . ESOPHAGOGASTRODUODENOSCOPY (EGD) WITH PROPOFOL  09/24/2012   Procedure: ESOPHAGOGASTRODUODENOSCOPY (EGD) WITH PROPOFOL;  Surgeon: [name], MD;  [facility];  Service: Endoscopy;  Laterality: N/A;  GE junction at 36  . ESOPHAGOGASTRODUODENOSCOPY (EGD) WITH PROPOFOL N/A 07/06/2017    Procedure: ESOPHAGOGASTRODUODENOSCOPY (EGD) WITH PROPOFOL;  Surgeon: [name], MD;  [facility];  Service: Endoscopy;  Laterality: N/A;  . ESOPHAGOGASTRODUODENOSCOPY W/ BANDING  08/2010  . GIVENS CAPSULE STUDY N/A 07/05/2017   Procedure: GIVENS CAPSULE STUDY;  Surgeon: [name], MD;  [facility];  Service: Endoscopy;  Laterality: N/A;  . POLYPECTOMY  03/24/2016   Procedure: POLYPECTOMY;  Surgeon: [name], MD;  [facility];  Service: Endoscopy;;  sigmoid polyp  . RIGHT HEART CATH N/A 04/16/2017   Procedure: Right Heart Cath;  Surgeon: [name], MD;  Location: [place] [facility];  Service: Cardiovascular;  Laterality: N/A;  . RIGHT HEART CATH N/A 07/12/2017   Procedure: RIGHT HEART CATH;  Surgeon: [name], MD;  Location: [place] [facility];  Service: Cardiovascular;  Laterality: N/A;  . TONSILLECTOMY    . UPPER GASTROINTESTINAL ENDOSCOPY  03/15/2011   EGD ED BANDING/TCS  . UPPER GASTROINTESTINAL ENDOSCOPY  09/05/2010  . UPPER GASTROINTESTINAL ENDOSCOPY  08/11/2010  . VAGINAL HYSTERECTOMY      Allergies  Allergen Reactions  .  Feraheme [Ferumoxytol]     Patient has severe back and chest pain when receiving FERAHEME infusions.  Marland Kitchen Ultram [Tramadol Hcl]     WEAKNESS    Current Outpatient Medications on File Prior to Visit  Medication Sig Dispense Refill  . cholecalciferol (VITAMIN D) 1000 UNITS tablet Take 1,000 Units by mouth every Monday, Wednesday, and Friday.     . flintstones complete (FLINTSTONES) 60 MG chewable tablet Chew 2 tablets daily by mouth.     . fluticasone furoate-vilanterol (BREO ELLIPTA) 200-25 MCG/INH AEPB Inhale 1 puff into the lungs daily. 1 each 3  . insulin aspart (NOVOLOG) 100 UNIT/ML FlexPen Use with given sliding scale  CBG 70-120: 0 units CBG 121-150: 1 unit CBG 151-200: 2 units CBG 201-250: 3 units CBG 251-300: 5 units CBG 301-350: 7 units CBG greater than 351: 9 units 15 mL 11   . Insulin Glargine (LANTUS SOLOSTAR) 100 UNIT/ML Solostar Pen Inject 38 Units daily at 10 pm into the skin. (Patient taking differently: Inject 42 Units into the skin daily at 10 pm. ) 15 mL 11  . lactulose (CHRONULAC) 10 GM/15ML solution Take 30 mLs (20 g total) 4 (four) times daily by mouth. 240 mL 0  . levothyroxine (SYNTHROID, LEVOTHROID) 25 MCG tablet Take 25 mcg by mouth daily before breakfast.     . pantoprazole (PROTONIX) 40 MG tablet Take 1 tablet by mouth once daily 90 tablet 3  . PARoxetine (PAXIL) 40 MG tablet Take 40 mg by mouth every morning.    . potassium chloride SA (K-DUR,KLOR-CON) 20 MEQ tablet Take 1.5 tablets (30 mEq total) daily by mouth. 45 tablet 3  . propranolol (INDERAL) 20 MG tablet Take 1 tablet (20 mg total) by mouth at bedtime as needed (for increased BP). (Patient taking differently: Take 20 mg daily by mouth. ) 30 tablet   . rosuvastatin (CRESTOR) 5 MG tablet Take 1 tablet (5 mg total) by mouth every other day. 45 tablet 3  . torsemide (DEMADEX) 20 MG tablet Take 1 tablet (20 mg total) daily by mouth. 30 tablet 6  . Insulin Pen Needle 31G X 5 MM MISC Use with each insulin injection 100 each 12  . OXYGEN Inhale 2 L into the lungs every evening. WITH SLEEP.     No current facility-administered medications on file prior to visit.         Objective:   Physical Exam Blood pressure (!) 170/70, pulse 96, temperature 98 F (36.7 C), height 5' 3"  (1.6 m), weight 219 lb 6.4 oz (99.5 kg). Alert and oriented. Skin warm and dry. Oral mucosa is moist.   . Sclera anicteric, conjunctivae is pink. Thyroid not enlarged. No cervical lymphadenopathy. Lungs clear. Heart regular rate and rhythm.  Abdomen is soft. Bowel sounds are positive. No hepatomegaly. No abdominal masses felt. No tenderness. 1+ edema to lower extremities.  Stool brown and guaiac positive.       Assessment:    NAFLD/Cirrhosis.  CBC, ammonia, MRI abdomen [place]: EGD with APC.     Plan:

## 2017-08-14 NOTE — Patient Instructions (Signed)
Labs and MRI . The risks of bleeding, perforation and infection were reviewed with patient.

## 2017-08-15 ENCOUNTER — Other Ambulatory Visit (INDEPENDENT_AMBULATORY_CARE_PROVIDER_SITE_OTHER): Payer: Self-pay | Admitting: *Deleted

## 2017-08-15 ENCOUNTER — Encounter (INDEPENDENT_AMBULATORY_CARE_PROVIDER_SITE_OTHER): Payer: Self-pay | Admitting: *Deleted

## 2017-08-15 DIAGNOSIS — K7460 Unspecified cirrhosis of liver: Secondary | ICD-10-CM

## 2017-08-15 LAB — CBC WITH DIFFERENTIAL/PLATELET
Basophils Absolute: 41 cells/uL (ref 0–200)
Basophils Relative: 0.6 %
Eosinophils Absolute: 82 cells/uL (ref 15–500)
Eosinophils Relative: 1.2 %
HCT: 27 % — ABNORMAL LOW (ref 35.0–45.0)
Hemoglobin: 8.4 g/dL — ABNORMAL LOW (ref 11.7–15.5)
Lymphs Abs: 1156 cells/uL (ref 850–3900)
MCH: 26.2 pg — ABNORMAL LOW (ref 27.0–33.0)
MCHC: 31.1 g/dL — ABNORMAL LOW (ref 32.0–36.0)
MCV: 84.1 fL (ref 80.0–100.0)
Monocytes Relative: 10.4 %
Neutro Abs: 4814 cells/uL (ref 1500–7800)
Neutrophils Relative %: 70.8 %
RBC: 3.21 10*6/uL — ABNORMAL LOW (ref 3.80–5.10)
RDW: 17.5 % — ABNORMAL HIGH (ref 11.0–15.0)
Total Lymphocyte: 17 %
WBC mixed population: 707 cells/uL (ref 200–950)
WBC: 6.8 10*3/uL (ref 3.8–10.8)

## 2017-08-15 LAB — AMMONIA: Ammonia: 64 umol/L (ref <=72)

## 2017-08-17 ENCOUNTER — Encounter (INDEPENDENT_AMBULATORY_CARE_PROVIDER_SITE_OTHER): Payer: Self-pay | Admitting: *Deleted

## 2017-08-17 ENCOUNTER — Encounter (HOSPITAL_COMMUNITY): Payer: Self-pay | Admitting: *Deleted

## 2017-08-17 ENCOUNTER — Ambulatory Visit (HOSPITAL_COMMUNITY)
Admission: RE | Admit: 2017-08-17 | Discharge: 2017-08-17 | Disposition: A | Discharge status: Home / Self Care | Payer: PPO | Source: Ambulatory Visit | Attending: Internal Medicine | Admitting: Internal Medicine

## 2017-08-17 ENCOUNTER — Encounter (HOSPITAL_COMMUNITY)
Admission: RE | Disposition: A | Discharge status: Home / Self Care | Payer: Self-pay | Source: Ambulatory Visit | Attending: Internal Medicine

## 2017-08-17 DIAGNOSIS — E7800 Pure hypercholesterolemia, unspecified: Secondary | ICD-10-CM | POA: Diagnosis not present

## 2017-08-17 DIAGNOSIS — K3189 Other diseases of stomach and duodenum: Secondary | ICD-10-CM | POA: Diagnosis not present

## 2017-08-17 DIAGNOSIS — M1990 Unspecified osteoarthritis, unspecified site: Secondary | ICD-10-CM | POA: Insufficient documentation

## 2017-08-17 DIAGNOSIS — E119 Type 2 diabetes mellitus without complications: Secondary | ICD-10-CM | POA: Insufficient documentation

## 2017-08-17 DIAGNOSIS — K7460 Unspecified cirrhosis of liver: Secondary | ICD-10-CM | POA: Diagnosis not present

## 2017-08-17 DIAGNOSIS — K228 Other specified diseases of esophagus: Secondary | ICD-10-CM | POA: Diagnosis not present

## 2017-08-17 DIAGNOSIS — K7290 Hepatic failure, unspecified without coma: Secondary | ICD-10-CM | POA: Insufficient documentation

## 2017-08-17 DIAGNOSIS — K449 Diaphragmatic hernia without obstruction or gangrene: Secondary | ICD-10-CM | POA: Diagnosis not present

## 2017-08-17 DIAGNOSIS — K760 Fatty (change of) liver, not elsewhere classified: Secondary | ICD-10-CM | POA: Insufficient documentation

## 2017-08-17 DIAGNOSIS — Z794 Long term (current) use of insulin: Secondary | ICD-10-CM | POA: Insufficient documentation

## 2017-08-17 DIAGNOSIS — Z86010 Personal history of colonic polyps: Secondary | ICD-10-CM | POA: Insufficient documentation

## 2017-08-17 DIAGNOSIS — I110 Hypertensive heart disease with heart failure: Secondary | ICD-10-CM | POA: Insufficient documentation

## 2017-08-17 DIAGNOSIS — K31819 Angiodysplasia of stomach and duodenum without bleeding: Secondary | ICD-10-CM | POA: Diagnosis present

## 2017-08-17 DIAGNOSIS — Z87442 Personal history of urinary calculi: Secondary | ICD-10-CM | POA: Insufficient documentation

## 2017-08-17 DIAGNOSIS — K921 Melena: Secondary | ICD-10-CM | POA: Insufficient documentation

## 2017-08-17 DIAGNOSIS — Z888 Allergy status to other drugs, medicaments and biological substances status: Secondary | ICD-10-CM | POA: Insufficient documentation

## 2017-08-17 DIAGNOSIS — Z79899 Other long term (current) drug therapy: Secondary | ICD-10-CM | POA: Diagnosis not present

## 2017-08-17 DIAGNOSIS — I509 Heart failure, unspecified: Secondary | ICD-10-CM | POA: Diagnosis not present

## 2017-08-17 DIAGNOSIS — M810 Age-related osteoporosis without current pathological fracture: Secondary | ICD-10-CM | POA: Diagnosis not present

## 2017-08-17 DIAGNOSIS — K31811 Angiodysplasia of stomach and duodenum with bleeding: Secondary | ICD-10-CM | POA: Diagnosis not present

## 2017-08-17 DIAGNOSIS — Z90710 Acquired absence of both cervix and uterus: Secondary | ICD-10-CM | POA: Insufficient documentation

## 2017-08-17 DIAGNOSIS — E039 Hypothyroidism, unspecified: Secondary | ICD-10-CM | POA: Diagnosis not present

## 2017-08-17 DIAGNOSIS — Z9049 Acquired absence of other specified parts of digestive tract: Secondary | ICD-10-CM | POA: Insufficient documentation

## 2017-08-17 DIAGNOSIS — M797 Fibromyalgia: Secondary | ICD-10-CM | POA: Insufficient documentation

## 2017-08-17 DIAGNOSIS — K7682 Hepatic encephalopathy: Secondary | ICD-10-CM | POA: Insufficient documentation

## 2017-08-17 DIAGNOSIS — I8510 Secondary esophageal varices without bleeding: Secondary | ICD-10-CM | POA: Insufficient documentation

## 2017-08-17 DIAGNOSIS — F329 Major depressive disorder, single episode, unspecified: Secondary | ICD-10-CM | POA: Diagnosis not present

## 2017-08-17 HISTORY — PX: ESOPHAGOGASTRODUODENOSCOPY: SHX5428

## 2017-08-17 HISTORY — PX: HOT HEMOSTASIS: SHX5433

## 2017-08-17 LAB — GLUCOSE, CAPILLARY: Glucose-Capillary: 192 mg/dL — ABNORMAL HIGH (ref 65–99)

## 2017-08-17 SURGERY — EGD (ESOPHAGOGASTRODUODENOSCOPY)
Anesthesia: Moderate Sedation

## 2017-08-17 MED ORDER — MIDAZOLAM HCL 5 MG/5ML IJ SOLN
INTRAMUSCULAR | Status: AC
  Filled 2017-08-17: qty 10

## 2017-08-17 MED ORDER — MEPERIDINE HCL 50 MG/ML IJ SOLN
INTRAMUSCULAR | Status: DC | PRN
  Administered 2017-08-17 (×2): 25 mg via INTRAVENOUS

## 2017-08-17 MED ORDER — LIDOCAINE VISCOUS 2 % MT SOLN
OROMUCOSAL | Status: DC | PRN
  Administered 2017-08-17: 4 mL via OROMUCOSAL

## 2017-08-17 MED ORDER — MEPERIDINE HCL 50 MG/ML IJ SOLN
INTRAMUSCULAR | Status: AC
  Filled 2017-08-17: qty 1

## 2017-08-17 MED ORDER — SODIUM CHLORIDE 0.9 % IV SOLN
INTRAVENOUS | Status: DC
  Administered 2017-08-17: 08:00:00 via INTRAVENOUS

## 2017-08-17 MED ORDER — ONDANSETRON 4 MG PO TBDP
ORAL_TABLET | ORAL | Status: AC
  Administered 2017-08-17: 4 mg via SUBLINGUAL
  Filled 2017-08-17: qty 1

## 2017-08-17 MED ORDER — LIDOCAINE VISCOUS 2 % MT SOLN
OROMUCOSAL | Status: AC
  Filled 2017-08-17: qty 15

## 2017-08-17 MED ORDER — STERILE WATER FOR IRRIGATION IR SOLN
Status: DC | PRN
  Administered 2017-08-17: 09:00:00

## 2017-08-17 MED ORDER — ONDANSETRON 4 MG PO TBDP: 4.0000 mg | ORAL_TABLET | Freq: Once | ORAL | Status: DC

## 2017-08-17 MED ORDER — MIDAZOLAM HCL 5 MG/5ML IJ SOLN
INTRAMUSCULAR | Status: DC | PRN
  Administered 2017-08-17 (×4): 2 mg via INTRAVENOUS

## 2017-08-17 NOTE — Progress Notes (Signed)
Patient sitting upright in chair, eating ice chips and tolerating well.

## 2017-08-17 NOTE — Progress Notes (Signed)
Patient complain of nausea. Dr. Laural Golden notified. Order received. Patient up to bathroom with assistance to void.

## 2017-08-17 NOTE — Op Note (Signed)
[facility] Patient Name: Maria [name] Procedure Date: 08/17/2017 8:18 AM MRN: [id number] Date of Birth: 11-07-1955 Attending MD: Hildred Laser , MD CSN: [id number] Age: 61 Admit Type: Outpatient Procedure:                Upper GI endoscopy Indications:              Therapeutic procedure Providers:                Hildred Laser, MD, Otis Peak B. Sharon Seller, RN, Nelma Rothman, Technician, Randa Spike, Technician Referring MD:             Glenda Chroman, Md Medicines:                Lidocaine spray, Meperidine 50 mg IV, Midazolam 8                            mg IV Complications:            No immediate complications. Estimated Blood Loss:     Estimated blood loss: none. Procedure:                Pre-Anesthesia Assessment:                           - Prior to the procedure, a History and Physical                            was performed, and patient medications and                            allergies were reviewed. The patient's tolerance of                            previous anesthesia was also reviewed. The risks                            and benefits of the procedure and the sedation                            options and risks were discussed with the patient.                            All questions were answered, and informed consent                            was obtained. Prior Anticoagulants: The patient has                            taken no previous anticoagulant or antiplatelet                            agents. ASA Grade Assessment: III - A patient with  severe systemic disease. After reviewing the risks                            and benefits, the patient was deemed in                            satisfactory condition to undergo the procedure.                           After obtaining informed consent, the endoscope was                            passed under direct vision. Throughout the   procedure, the patient's blood pressure, pulse, and                            oxygen saturations were monitored continuously. The                            [id number]) was introduced through the mouth,                            and advanced to the second part of duodenum. The                            upper GI endoscopy was accomplished without                            difficulty. The patient tolerated the procedure                            well. Scope In: 8:52:39 AM Scope Out: 9:07:47 AM Total Procedure Duration: 0 hours 15 minutes 8 seconds  Findings:      The proximal esophagus and mid esophagus were normal.      A post variceal banding scar was found in the distal esophagus.      The Z-line was regular and was found 35 cm from the incisors.      A 2 cm hiatal hernia was present.      Moderate gastric antral vascular ectasia without bleeding was present in       the gastric fundus and in the gastric body.      Severe gastric antral vascular ectasia with bleeding was present in the       gastric antrum and in the prepyloric region of the stomach. Coagulation       for hemostasis using argon plasma was successful.      Patchy mildly erythematous mucosa without active bleeding and with no       stigmata of bleeding was found in the duodenal bulb and 2nd part of       duodenum. Impression:               - Normal proximal esophagus and mid esophagus.                           - Scar in the distal esophagus.                           -  Z-line regular, 35 cm from the incisors.                           - 2 cm hiatal hernia.                           - Gastric antral vascular ectasia without bleeding.                           - Gastric antral vascular ectasia with bleeding.                            Treated with argon plasma coagulation ([facility]).                           - Erythematous duodenopathy(PHD)                           - No specimens collected. Moderate Sedation:       Moderate (conscious) sedation was administered by the endoscopy nurse       and supervised by the endoscopist. The following parameters were       monitored: oxygen saturation, heart rate, blood pressure, CO2       capnography and response to care. Total physician intraservice time was       21 minutes. Recommendation:           - Patient has a contact number available for                            emergencies. The signs and symptoms of potential                            delayed complications were discussed with the                            patient. Return to normal activities tomorrow.                            Written discharge instructions were provided to the                            patient.                           - Resume previous diet today.                           - Continue present medications.                           - Repeat upper endoscopy in 4 weeks. Procedure Code(s):        --- Professional ---                           [phone], Esophagogastroduodenoscopy, flexible,  transoral; with control of bleeding, any method                           99152, Moderate sedation services provided by the                            same physician or other qualified health care                            professional performing the diagnostic or                            therapeutic service that the sedation supports,                            requiring the presence of an independent trained                            observer to assist in the monitoring of the                            patient's level of consciousness and physiological                            status; initial 15 minutes of intraservice time,                            patient age 29 years or older Diagnosis Code(s):        --- Professional ---                           K22.8, Other specified diseases of esophagus                           K44.9, Diaphragmatic hernia without  obstruction or                            gangrene                           K31.811, Angiodysplasia of stomach and duodenum                            with bleeding                           K31.89, Other diseases of stomach and duodenum CPT copyright 2016 [employer]. All rights reserved. The codes documented in this report are preliminary and upon coder review may  be revised to meet current compliance requirements. Hildred Laser, MD Hildred Laser, MD 08/17/2017 9:30:12 AM This report has been signed electronically. Number of Addenda: 0

## 2017-08-17 NOTE — Discharge Instructions (Signed)
Resume usual medications and diet as before. No driving for 24 hours. Repeat EGD in 4 weeks.      Esophagogastroduodenoscopy, Care After Refer to this sheet in the next few weeks. These instructions provide you with information about caring for yourself after your procedure. Your health care provider may also give you more specific instructions. Your treatment has been planned according to current medical practices, but problems sometimes occur. Call your health care provider if you have any problems or questions after your procedure. What can I expect after the procedure? After the procedure, it is common to have:  A sore throat.  Nausea.  Bloating.  Dizziness.  Fatigue.  Follow these instructions at home:  Do not eat or drink anything until the numbing medicine (local anesthetic) has worn off and your gag reflex has returned. You will know that the local anesthetic has worn off when you can swallow comfortably.  Do not drive for 24 hours if you received a medicine to help you relax (sedative).  If your health care provider took a tissue sample for testing during the procedure, make sure to get your test results. This is your responsibility. Ask your health care provider or the department performing the test when your results will be ready.  Keep all follow-up visits as told by your health care provider. This is important. Contact a health care provider if:  You cannot stop coughing.  You are not urinating.  You are urinating less than usual. Get help right away if:  You have trouble swallowing.  You cannot eat or drink.  You have throat or chest pain that gets worse.  You are dizzy or light-headed.  You faint.  You have nausea or vomiting.  You have chills.  You have a fever.  You have severe abdominal pain.  You have black, tarry, or bloody stools. This information is not intended to replace advice given to you by your health care provider. Make sure you  discuss any questions you have with your health care provider. Document Released: 08/21/2012 Document Revised: 02/10/2016 Document Reviewed: 07/29/2015 Elsevier Interactive Patient Education  [employer].

## 2017-08-17 NOTE — H&P (Signed)
Maria [name] is an 61 y.o. female.   Chief Complaint: Patient is here for EGD for APC of GAVE. HPI: Patient is 61 year old Caucasian female who was cirrhosis secondary to NAFLD complicated by esophageal varices which have been banded multiple times, hepatic encephalopathy and she has developed gastric antral vascular ectasia.  She was hospitalized last month with hemoglobin of 5.8 g and received 3 units of PRBCs.  She underwent APC of GAVE.  Her hemoglobin has decreased again.  She has been experiencing intermittent melena.  She has not had hematemesis.  She has been seen at [facility] for transplant evaluation.  She has follow-up visit in 2 months. She does not take OTC NSAIDs.  Past Medical History:  Diagnosis Date  . Anemia   . CHF (congestive heart failure) ([place])   . Cirrhosis ([place])   . Depression   . Diabetes mellitus   . Esophageal bleed, non-variceal   . Eye hemorrhage    Behind left eye  . Fibromyalgia   . Hypercholesteremia   . Hypertension   . Hypothyroidism   . NAFLD (nonalcoholic fatty liver disease)   . Osteoarthrosis   . Osteoporosis   . PONV (postoperative nausea and vomiting)   . UTI (urinary tract infection) 11/12 end of the month   Patient feels that she passed a kidney stone at that time    Past Surgical History:  Procedure Laterality Date  . APPENDECTOMY  1980  . BILATERAL SALPINGOOPHORECTOMY    . CHOLECYSTECTOMY    . COLONOSCOPY  03/15/2011  . COLONOSCOPY N/A 03/24/2016   Procedure: COLONOSCOPY;  Surgeon: [name], MD;  [facility];  Service: Endoscopy;  Laterality: N/A;  855  . ESOPHAGEAL BANDING  04/24/2012   Procedure: ESOPHAGEAL BANDING;  Surgeon: [name], MD;  [facility];  Service: Endoscopy;  Laterality: N/A;  . Esophageal BANDING  08/03/2012   [facility] [place] [facility] [place]  09/24/2012   Procedure: ESOPHAGEAL BANDING;  Surgeon: [name], MD;  Location: [facility];   Service: Endoscopy;  Laterality: N/A;  Banding x 3  . ESOPHAGEAL BANDING N/A 01/29/2013   Procedure: ESOPHAGEAL BANDING;  Surgeon: [name], MD;  [facility];  Service: Endoscopy;  Laterality: N/A;  . ESOPHAGEAL BANDING N/A 06/19/2014   Procedure: ESOPHAGEAL BANDING;  Surgeon: [name], MD;  [facility];  Service: Endoscopy;  Laterality: N/A;  . ESOPHAGEAL BANDING N/A 01/05/2016   Procedure: ESOPHAGEAL BANDING;  Surgeon: [name], MD;  [facility];  Service: Endoscopy;  Laterality: N/A;  . ESOPHAGEAL BANDING N/A 08/03/2016   Procedure: ESOPHAGEAL BANDING;  Surgeon: [name], MD;  [facility];  Service: Endoscopy;  Laterality: N/A;  . ESOPHAGEAL BANDING N/A 05/02/2017   Procedure: ESOPHAGEAL BANDING;  Surgeon: [name], MD;  [facility];  Service: Endoscopy;  Laterality: N/A;  . ESOPHAGOGASTRODUODENOSCOPY  04/24/2012   Procedure: ESOPHAGOGASTRODUODENOSCOPY (EGD);  Surgeon: [name], MD;  [facility];  Service: Endoscopy;  Laterality: N/A;  300  . ESOPHAGOGASTRODUODENOSCOPY N/A 01/29/2013   Procedure: ESOPHAGOGASTRODUODENOSCOPY (EGD);  Surgeon: [name], MD;  [facility];  Service: Endoscopy;  Laterality: N/A;  1200  . ESOPHAGOGASTRODUODENOSCOPY N/A 05/22/2013   Procedure: ESOPHAGOGASTRODUODENOSCOPY (EGD);  Surgeon: [name], MD;  [facility];  Service: Endoscopy;  Laterality: N/A;  1:55  . ESOPHAGOGASTRODUODENOSCOPY N/A  12/31/2013   Procedure: ESOPHAGOGASTRODUODENOSCOPY (EGD);  Surgeon: [name], MD;  [facility];  Service: Endoscopy;  Laterality: N/A;  1200  . ESOPHAGOGASTRODUODENOSCOPY N/A 06/19/2014   Procedure: ESOPHAGOGASTRODUODENOSCOPY (EGD);  Surgeon: [name], MD;  [facility];  Service: Endoscopy;  Laterality: N/A;  1055  . ESOPHAGOGASTRODUODENOSCOPY N/A 01/15/2015   Procedure: ESOPHAGOGASTRODUODENOSCOPY (EGD);   Surgeon: [name], MD;  [facility];  Service: Endoscopy;  Laterality: N/A;  730 - moved to 9:45 - moved to 1250-Ann notified pt  . ESOPHAGOGASTRODUODENOSCOPY N/A 06/30/2015   Procedure: ESOPHAGOGASTRODUODENOSCOPY (EGD);  Surgeon: [name], MD;  [facility];  Service: Endoscopy;  Laterality: N/A;  1200  . ESOPHAGOGASTRODUODENOSCOPY N/A 01/05/2016   Procedure: ESOPHAGOGASTRODUODENOSCOPY (EGD);  Surgeon: [name], MD;  [facility];  Service: Endoscopy;  Laterality: N/A;  830  . ESOPHAGOGASTRODUODENOSCOPY N/A 08/03/2016   Procedure: ESOPHAGOGASTRODUODENOSCOPY (EGD);  Surgeon: [name], MD;  [facility];  Service: Endoscopy;  Laterality: N/A;  930  . ESOPHAGOGASTRODUODENOSCOPY N/A 05/02/2017   Procedure: ESOPHAGOGASTRODUODENOSCOPY (EGD);  Surgeon: [name], MD;  [facility];  Service: Endoscopy;  Laterality: N/A;  12:00  . ESOPHAGOGASTRODUODENOSCOPY (EGD) WITH PROPOFOL  09/24/2012   Procedure: ESOPHAGOGASTRODUODENOSCOPY (EGD) WITH PROPOFOL;  Surgeon: [name], MD;  [facility];  Service: Endoscopy;  Laterality: N/A;  GE junction at 36  . ESOPHAGOGASTRODUODENOSCOPY (EGD) WITH PROPOFOL N/A 07/06/2017   Procedure: ESOPHAGOGASTRODUODENOSCOPY (EGD) WITH PROPOFOL;  Surgeon: [name], MD;  [facility];  Service: Endoscopy;  Laterality: N/A;  . ESOPHAGOGASTRODUODENOSCOPY W/ BANDING  08/2010  . GIVENS CAPSULE STUDY N/A 07/05/2017   Procedure: GIVENS CAPSULE STUDY;  Surgeon: [name], MD;  [facility];  Service: Endoscopy;  Laterality: N/A;  . POLYPECTOMY  03/24/2016   Procedure: POLYPECTOMY;  Surgeon: [name], MD;  [facility];  Service: Endoscopy;;  sigmoid polyp  . RIGHT HEART CATH N/A 04/16/2017   Procedure: Right Heart Cath;  Surgeon: [name], MD;  Location: [place] [facility];  Service: Cardiovascular;  Laterality: N/A;  . RIGHT HEART  CATH N/A 07/12/2017   Procedure: RIGHT HEART CATH;  Surgeon: [name], MD;  Location: [place] [facility];  Service: Cardiovascular;  Laterality: N/A;  . TONSILLECTOMY    . UPPER GASTROINTESTINAL ENDOSCOPY  03/15/2011   EGD ED BANDING/TCS  . UPPER GASTROINTESTINAL ENDOSCOPY  09/05/2010  . UPPER GASTROINTESTINAL ENDOSCOPY  08/11/2010  . VAGINAL HYSTERECTOMY      Family History  Problem Relation Age of Onset  . Lung cancer Mother   . Diabetes Father   . Diabetes Sister   . Hypertension Sister   . Hypothyroidism Sister   . Colon cancer Brother   . Diabetes Sister   . Hypothyroidism Brother   . Healthy Daughter   . Obesity Daughter   . Hypertension Daughter    Social History:  reports that  has never smoked. she has never used smokeless tobacco. She reports that she does not drink alcohol or use drugs.  Allergies:  Allergies  Allergen Reactions  . Feraheme [Ferumoxytol] Other (See Comments)    Patient has severe back and chest pain when receiving FERAHEME infusions.  Marland Kitchen Ultram [Tramadol Hcl] Other (See Comments)    WEAKNESS    Medications Prior to Admission  Medication Sig Dispense Refill  . cholecalciferol (VITAMIN D) 1000 UNITS tablet Take 1,000 Units  by mouth every Monday, Wednesday, and Friday.     . flintstones complete (FLINTSTONES) 60 MG chewable tablet Chew 1 tablet by mouth daily.     . fluticasone furoate-vilanterol (BREO ELLIPTA) 200-25 MCG/INH AEPB Inhale 1 puff into the lungs daily. 1 each 3  . insulin aspart (NOVOLOG) 100 UNIT/ML FlexPen Use with given sliding scale  CBG 70-120: 0 units CBG 121-150: 1 unit CBG 151-200: 2 units CBG 201-250: 3 units CBG 251-300: 5 units CBG 301-350: 7 units CBG greater than 351: 9 units (Patient taking differently: Inject 0-9 Units into the skin 4 (four) times daily -  with meals and at bedtime. Use with given sliding scale  CBG 70-120: 0 units CBG 121-150: 1 unit CBG 151-200: 2 units CBG 201-250: 3 units CBG  251-300: 5 units CBG 301-350: 7 units CBG greater than 351: 9 units) 15 mL 11  . Insulin Glargine (LANTUS SOLOSTAR) 100 UNIT/ML Solostar Pen Inject 38 Units daily at 10 pm into the skin. (Patient taking differently: Inject 42 Units into the skin daily at 10 pm. ) 15 mL 11  . lactulose (CHRONULAC) 10 GM/15ML solution Take 30 mLs (20 g total) 4 (four) times daily by mouth. (Patient taking differently: Take 40 g by mouth 4 (four) times daily. ) 240 mL 0  . levothyroxine (SYNTHROID, LEVOTHROID) 25 MCG tablet Take 25 mcg by mouth daily before breakfast.     . OXYGEN Inhale 2 L into the lungs at bedtime.     . pantoprazole (PROTONIX) 40 MG tablet Take 1 tablet by mouth once daily (Patient taking differently: Take 40 mg by mouth once daily) 90 tablet 3  . PARoxetine (PAXIL) 40 MG tablet Take 40 mg by mouth every morning.    . potassium chloride SA (K-DUR,KLOR-CON) 20 MEQ tablet Take 1.5 tablets (30 mEq total) daily by mouth. 45 tablet 3  . propranolol (INDERAL) 20 MG tablet Take 1 tablet (20 mg total) by mouth at bedtime as needed (for increased BP). (Patient taking differently: Take 20 mg daily by mouth. ) 30 tablet   . rosuvastatin (CRESTOR) 5 MG tablet Take 1 tablet (5 mg total) by mouth every other day. 45 tablet 3  . torsemide (DEMADEX) 20 MG tablet Take 1 tablet (20 mg total) daily by mouth. 30 tablet 6  . Insulin Pen Needle 31G X 5 MM MISC Use with each insulin injection (Patient not taking: Reported on 08/15/2017) 100 each 12    Results for orders placed or performed during the hospital encounter of 08/17/17 (from the past 48 hour(s))  Glucose, capillary     Status: Abnormal   Collection Time: 08/17/17  7:29 AM  Result Value Ref Range   Glucose-Capillary 192 (H) 65 - 99 mg/dL   No results found.  ROS  Blood pressure (!) 110/37, pulse 64, temperature 98.4 F (36.9 C), temperature source Oral, resp. rate 17, height 5' 2"  (1.575 m), weight 222 lb (100.7 kg), SpO2 100 %. Physical Exam   Constitutional: She appears well-developed and well-nourished.  HENT:  Mouth/Throat: Oropharynx is clear and moist.  Eyes: Conjunctivae are normal. No scleral icterus.  Neck: No thyromegaly present.  Cardiovascular: Normal rate.  Regular rhythm normal S1 and S2.  Faint systolic ejection murmur noted at left sternal border  Respiratory: Effort normal and breath sounds normal.  GI: Soft. She exhibits no distension and no mass. There is no tenderness.  Musculoskeletal: She exhibits edema.  1+ pitting edema around both ankles.  Lymphadenopathy:  She has no cervical adenopathy.  Neurological: She is alert.  Skin: Skin is warm and dry.     Assessment/Plan Gastric antral vascular ectasia with GI bleed and anemia. Cirrhosis with history of esophageal varices which have been banded multiple times. EGD with therapeutic intention.  Hildred Laser, MD 08/17/2017, 8:36 AM

## 2017-08-20 ENCOUNTER — Encounter (HOSPITAL_COMMUNITY): Payer: Self-pay | Admitting: Internal Medicine

## 2017-08-20 ENCOUNTER — Ambulatory Visit (HOSPITAL_COMMUNITY)
Admission: RE | Admit: 2017-08-20 | Discharge: 2017-08-20 | Disposition: A | Discharge status: Home / Self Care | Payer: PPO | Source: Ambulatory Visit | Attending: Internal Medicine | Admitting: Internal Medicine

## 2017-08-20 DIAGNOSIS — K7460 Unspecified cirrhosis of liver: Secondary | ICD-10-CM | POA: Insufficient documentation

## 2017-08-20 DIAGNOSIS — I700 Atherosclerosis of aorta: Secondary | ICD-10-CM | POA: Insufficient documentation

## 2017-08-20 DIAGNOSIS — R188 Other ascites: Secondary | ICD-10-CM | POA: Diagnosis not present

## 2017-08-20 DIAGNOSIS — I81 Portal vein thrombosis: Secondary | ICD-10-CM | POA: Diagnosis not present

## 2017-08-20 DIAGNOSIS — I864 Gastric varices: Secondary | ICD-10-CM | POA: Diagnosis not present

## 2017-08-20 DIAGNOSIS — K760 Fatty (change of) liver, not elsewhere classified: Secondary | ICD-10-CM | POA: Diagnosis not present

## 2017-08-20 DIAGNOSIS — I8500 Esophageal varices without bleeding: Secondary | ICD-10-CM | POA: Insufficient documentation

## 2017-08-20 DIAGNOSIS — R161 Splenomegaly, not elsewhere classified: Secondary | ICD-10-CM | POA: Diagnosis not present

## 2017-08-20 DIAGNOSIS — I868 Varicose veins of other specified sites: Secondary | ICD-10-CM | POA: Insufficient documentation

## 2017-08-20 DIAGNOSIS — K766 Portal hypertension: Secondary | ICD-10-CM | POA: Insufficient documentation

## 2017-08-20 MED ORDER — GADOBENATE DIMEGLUMINE 529 MG/ML IV SOLN
20.0000 mL | Freq: Once | INTRAVENOUS | Status: AC | PRN
  Administered 2017-08-20: 20 mL via INTRAVENOUS

## 2017-08-21 ENCOUNTER — Telehealth (INDEPENDENT_AMBULATORY_CARE_PROVIDER_SITE_OTHER): Payer: Self-pay | Admitting: Internal Medicine

## 2017-08-21 NOTE — Telephone Encounter (Signed)
err

## 2017-08-21 NOTE — Telephone Encounter (Signed)
Please send copy of MRI to [facility]

## 2017-08-21 NOTE — Telephone Encounter (Signed)
report faxed to Dr Monica Martinez

## 2017-08-22 ENCOUNTER — Other Ambulatory Visit (INDEPENDENT_AMBULATORY_CARE_PROVIDER_SITE_OTHER): Payer: Self-pay | Admitting: *Deleted

## 2017-08-22 ENCOUNTER — Encounter (INDEPENDENT_AMBULATORY_CARE_PROVIDER_SITE_OTHER): Payer: Self-pay | Admitting: Internal Medicine

## 2017-08-22 DIAGNOSIS — K7469 Other cirrhosis of liver: Secondary | ICD-10-CM

## 2017-08-24 ENCOUNTER — Telehealth (INDEPENDENT_AMBULATORY_CARE_PROVIDER_SITE_OTHER): Payer: Self-pay | Admitting: *Deleted

## 2017-08-24 DIAGNOSIS — R0609 Other forms of dyspnea: Secondary | ICD-10-CM | POA: Diagnosis not present

## 2017-08-24 NOTE — Telephone Encounter (Signed)
Patient had called about guidelines relating to her fluid and fluid medication. Addressed with Dr.Rehman and these are his recommendations. Patient's weight is 220 lbs she will take 1 Torsemide daily , 215 lbs do not take medication , 225 lbs patient will take  2 Torsemide. 220 lbs is the goal weight for her. Patient was called and made aware.

## 2017-08-28 ENCOUNTER — Ambulatory Visit: Admit: 2017-08-28 | Discharge: 2017-08-28 | Disposition: A | Discharge status: Home / Self Care | Payer: MEDICARE

## 2017-08-28 DIAGNOSIS — K7460 Unspecified cirrhosis of liver: Principal | ICD-10-CM

## 2017-08-28 DIAGNOSIS — K766 Portal hypertension: Secondary | ICD-10-CM | POA: Diagnosis not present

## 2017-08-30 ENCOUNTER — Encounter (INDEPENDENT_AMBULATORY_CARE_PROVIDER_SITE_OTHER): Payer: Self-pay | Admitting: *Deleted

## 2017-08-31 DIAGNOSIS — K7460 Unspecified cirrhosis of liver: Secondary | ICD-10-CM | POA: Diagnosis not present

## 2017-09-01 LAB — CBC
HCT: 24.7 % — ABNORMAL LOW (ref 35.0–45.0)
Hemoglobin: 7.3 g/dL — ABNORMAL LOW (ref 11.7–15.5)
MCH: 24.2 pg — ABNORMAL LOW (ref 27.0–33.0)
MCHC: 29.6 g/dL — ABNORMAL LOW (ref 32.0–36.0)
MCV: 81.8 fL (ref 80.0–100.0)
RBC: 3.02 10*6/uL — ABNORMAL LOW (ref 3.80–5.10)
RDW: 17 % — ABNORMAL HIGH (ref 11.0–15.0)
WBC: 5.5 10*3/uL (ref 3.8–10.8)

## 2017-09-01 LAB — AMMONIA: Ammonia: 73 umol/L — ABNORMAL HIGH (ref <=72)

## 2017-09-03 ENCOUNTER — Telehealth (INDEPENDENT_AMBULATORY_CARE_PROVIDER_SITE_OTHER): Payer: Self-pay | Admitting: Internal Medicine

## 2017-09-03 NOTE — Telephone Encounter (Signed)
I will need to do a PA for the blood transfusion. Then the order will be sent to the hospital.

## 2017-09-03 NOTE — Telephone Encounter (Signed)
Maria [name], T and C x 2 units. Hemoglobin 7.3

## 2017-09-04 NOTE — Telephone Encounter (Signed)
PA completed and faxed to patient's insurance company.

## 2017-09-07 ENCOUNTER — Telehealth (INDEPENDENT_AMBULATORY_CARE_PROVIDER_SITE_OTHER): Payer: Self-pay | Admitting: *Deleted

## 2017-09-07 NOTE — Telephone Encounter (Signed)
Weight has been going up about 2 pounds a day even though she is taking fluid pill -- her weight is up to 231 -- her urine output is decreasing - she is driking at least 48 or more ounces of fluid day -- she can feel fluid in her thighs like she did before -- please advise

## 2017-09-07 NOTE — Telephone Encounter (Signed)
Per Dr.Rehman the patient is to take Torsemide 2-3 a day until weight is 220 lbs. At which time she will drop the Torsemide to 2 a day. Patient was called and made aware.

## 2017-09-12 ENCOUNTER — Encounter (HOSPITAL_COMMUNITY): Payer: Self-pay

## 2017-09-12 ENCOUNTER — Ambulatory Visit (HOSPITAL_COMMUNITY)
Admission: RE | Admit: 2017-09-12 | Discharge: 2017-09-12 | Disposition: A | Discharge status: Home / Self Care | Payer: PPO | Source: Ambulatory Visit | Attending: Internal Medicine | Admitting: Internal Medicine

## 2017-09-12 ENCOUNTER — Other Ambulatory Visit: Payer: Self-pay

## 2017-09-12 ENCOUNTER — Encounter (HOSPITAL_COMMUNITY)
Admission: RE | Disposition: A | Discharge status: Home / Self Care | Payer: Self-pay | Source: Ambulatory Visit | Attending: Internal Medicine

## 2017-09-12 DIAGNOSIS — F329 Major depressive disorder, single episode, unspecified: Secondary | ICD-10-CM | POA: Diagnosis not present

## 2017-09-12 DIAGNOSIS — K922 Gastrointestinal hemorrhage, unspecified: Secondary | ICD-10-CM

## 2017-09-12 DIAGNOSIS — Z7951 Long term (current) use of inhaled steroids: Secondary | ICD-10-CM | POA: Diagnosis not present

## 2017-09-12 DIAGNOSIS — Z79899 Other long term (current) drug therapy: Secondary | ICD-10-CM | POA: Insufficient documentation

## 2017-09-12 DIAGNOSIS — K7469 Other cirrhosis of liver: Secondary | ICD-10-CM | POA: Insufficient documentation

## 2017-09-12 DIAGNOSIS — K7581 Nonalcoholic steatohepatitis (NASH): Secondary | ICD-10-CM | POA: Insufficient documentation

## 2017-09-12 DIAGNOSIS — E039 Hypothyroidism, unspecified: Secondary | ICD-10-CM | POA: Insufficient documentation

## 2017-09-12 DIAGNOSIS — K3189 Other diseases of stomach and duodenum: Secondary | ICD-10-CM | POA: Diagnosis not present

## 2017-09-12 DIAGNOSIS — K766 Portal hypertension: Secondary | ICD-10-CM | POA: Insufficient documentation

## 2017-09-12 DIAGNOSIS — Z794 Long term (current) use of insulin: Secondary | ICD-10-CM | POA: Insufficient documentation

## 2017-09-12 DIAGNOSIS — E119 Type 2 diabetes mellitus without complications: Secondary | ICD-10-CM | POA: Insufficient documentation

## 2017-09-12 DIAGNOSIS — K31811 Angiodysplasia of stomach and duodenum with bleeding: Secondary | ICD-10-CM | POA: Insufficient documentation

## 2017-09-12 DIAGNOSIS — K7460 Unspecified cirrhosis of liver: Secondary | ICD-10-CM | POA: Diagnosis not present

## 2017-09-12 DIAGNOSIS — R58 Hemorrhage, not elsewhere classified: Secondary | ICD-10-CM | POA: Diagnosis not present

## 2017-09-12 DIAGNOSIS — E7800 Pure hypercholesterolemia, unspecified: Secondary | ICD-10-CM | POA: Insufficient documentation

## 2017-09-12 HISTORY — PX: ESOPHAGOGASTRODUODENOSCOPY: SHX5428

## 2017-09-12 HISTORY — PX: HOT HEMOSTASIS: SHX5433

## 2017-09-12 LAB — CBC
HCT: 21.7 % — ABNORMAL LOW (ref 36.0–46.0)
Hemoglobin: 6.1 g/dL — CL (ref 12.0–15.0)
MCH: 23.4 pg — ABNORMAL LOW (ref 26.0–34.0)
MCHC: 28.1 g/dL — ABNORMAL LOW (ref 30.0–36.0)
MCV: 83.1 fL (ref 78.0–100.0)
Platelets: 73 10*3/uL — ABNORMAL LOW (ref 150–400)
RBC: 2.61 MIL/uL — ABNORMAL LOW (ref 3.87–5.11)
RDW: 19.8 % — ABNORMAL HIGH (ref 11.5–15.5)
WBC: 4.3 10*3/uL (ref 4.0–10.5)

## 2017-09-12 LAB — HEMOGLOBIN AND HEMATOCRIT, BLOOD
HCT: 28.1 % — ABNORMAL LOW (ref 36.0–46.0)
Hemoglobin: 8.3 g/dL — ABNORMAL LOW (ref 12.0–15.0)

## 2017-09-12 LAB — PREPARE RBC (CROSSMATCH)

## 2017-09-12 LAB — GLUCOSE, CAPILLARY: Glucose-Capillary: 171 mg/dL — ABNORMAL HIGH (ref 65–99)

## 2017-09-12 SURGERY — EGD (ESOPHAGOGASTRODUODENOSCOPY)
Anesthesia: Moderate Sedation

## 2017-09-12 MED ORDER — SPIRONOLACTONE 50 MG PO TABS: 50.0000 mg | ORAL_TABLET | Freq: Two times a day (BID) | ORAL | 5 refills | Status: DC

## 2017-09-12 MED ORDER — MIDAZOLAM HCL 5 MG/5ML IJ SOLN
INTRAMUSCULAR | Status: AC
  Filled 2017-09-12: qty 10

## 2017-09-12 MED ORDER — MEPERIDINE HCL 50 MG/ML IJ SOLN
INTRAMUSCULAR | Status: DC | PRN
  Administered 2017-09-12 (×2): 25 mg via INTRAVENOUS

## 2017-09-12 MED ORDER — FUROSEMIDE 10 MG/ML IJ SOLN: 20.0000 mg | Freq: Once | INTRAMUSCULAR | Status: DC

## 2017-09-12 MED ORDER — LIDOCAINE VISCOUS 2 % MT SOLN
OROMUCOSAL | Status: DC | PRN
  Administered 2017-09-12: 4 mL via OROMUCOSAL

## 2017-09-12 MED ORDER — FUROSEMIDE 20 MG PO TABS
20.0000 mg | ORAL_TABLET | Freq: Once | ORAL | Status: AC
  Administered 2017-09-12: 20 mg via ORAL
  Filled 2017-09-12: qty 1

## 2017-09-12 MED ORDER — SODIUM CHLORIDE 0.9 % IV SOLN
Freq: Once | INTRAVENOUS | Status: AC
  Administered 2017-09-12: 11:00:00 via INTRAVENOUS

## 2017-09-12 MED ORDER — SODIUM CHLORIDE 0.9 % IV SOLN: Freq: Once | INTRAVENOUS | Status: DC

## 2017-09-12 MED ORDER — MIDAZOLAM HCL 5 MG/5ML IJ SOLN
INTRAMUSCULAR | Status: DC | PRN
  Administered 2017-09-12: 2 mg via INTRAVENOUS
  Administered 2017-09-12 (×2): 1 mg via INTRAVENOUS
  Administered 2017-09-12 (×3): 2 mg via INTRAVENOUS

## 2017-09-12 MED ORDER — FUROSEMIDE 10 MG/ML IJ SOLN
40.0000 mg | Freq: Once | INTRAMUSCULAR | Status: AC
  Administered 2017-09-12: 40 mg via INTRAVENOUS
  Filled 2017-09-12: qty 4

## 2017-09-12 MED ORDER — LIDOCAINE VISCOUS 2 % MT SOLN
OROMUCOSAL | Status: AC
  Filled 2017-09-12: qty 15

## 2017-09-12 MED ORDER — SODIUM CHLORIDE 0.9 % IV SOLN
INTRAVENOUS | Status: DC
  Administered 2017-09-12: 08:00:00 via INTRAVENOUS

## 2017-09-12 MED ORDER — DIPHENHYDRAMINE HCL 25 MG PO CAPS
25.0000 mg | ORAL_CAPSULE | Freq: Once | ORAL | Status: AC
  Administered 2017-09-12: 25 mg via ORAL
  Filled 2017-09-12: qty 1

## 2017-09-12 MED ORDER — ACETAMINOPHEN 325 MG PO TABS
650.0000 mg | ORAL_TABLET | Freq: Once | ORAL | Status: AC
  Administered 2017-09-12: 650 mg via ORAL
  Filled 2017-09-12: qty 2

## 2017-09-12 MED ORDER — STERILE WATER FOR IRRIGATION IR SOLN
Status: DC | PRN
  Administered 2017-09-12: 09:00:00

## 2017-09-12 MED ORDER — MEPERIDINE HCL 50 MG/ML IJ SOLN
INTRAMUSCULAR | Status: AC
  Filled 2017-09-12: qty 1

## 2017-09-12 NOTE — H&P (Signed)
Maria [name] is an 61 y.o. female.   Chief Complaint: Patient is here for EGD and APC of GAVE. HPI: Patient is 61 year old Caucasian female with decompensated cirrhosis due to NASH implicated by esophageal variceal bleed for which she has undergone multiple bandings.  She now has developed gastric antral vascular ectasia with bleed.  She underwent first APC therapy on 07/05/2017 when she was hospitalized and repeat session was on 08/17/2017.  Hemoglobin about 12 days ago was 7.3 g.  She was supposed to get 2 units of PRBCs but has not received them yet.  She denies melena.  She remains with weakness and exertional dyspnea.  She has gained more than 10 pounds over the last few weeks. Patient had MR of liver 3 weeks ago and it revealed nonocclusive thrombus in the portal vein.  Patient is not a candidate for anticoagulation because of bleeding issues. Today she is undergoing EGD with therapeutic intention.  Past Medical History:  Diagnosis Date  . Anemia   . CHF (congestive heart failure) ([place])   . Cirrhosis ([place])   . Depression   . Diabetes mellitus   . Esophageal bleed, non-variceal   . Eye hemorrhage    Behind left eye  . Fibromyalgia   . Hypercholesteremia   . Hypertension   . Hypothyroidism   . NAFLD (nonalcoholic fatty liver disease)   . Osteoarthrosis   . Osteoporosis   . PONV (postoperative nausea and vomiting)   . UTI (urinary tract infection) 11/12 end of the month   Patient feels that she passed a kidney stone at that time    Past Surgical History:  Procedure Laterality Date  . APPENDECTOMY  1980  . BILATERAL SALPINGOOPHORECTOMY    . CHOLECYSTECTOMY    . COLONOSCOPY  03/15/2011  . COLONOSCOPY N/A 03/24/2016   Procedure: COLONOSCOPY;  Surgeon: [name], MD;  [facility];  Service: Endoscopy;  Laterality: N/A;  855  . ESOPHAGEAL BANDING  04/24/2012   Procedure: ESOPHAGEAL BANDING;  Surgeon: [name], MD;  [facility];  Service:  Endoscopy;  Laterality: N/A;  . Esophageal BANDING  08/03/2012   [facility] in [place] , [place]  09/24/2012   Procedure: ESOPHAGEAL BANDING;  Surgeon: [name], MD;  Location: [facility];  Service: Endoscopy;  Laterality: N/A;  Banding x 3  . ESOPHAGEAL BANDING N/A 01/29/2013   Procedure: ESOPHAGEAL BANDING;  Surgeon: [name], MD;  [facility];  Service: Endoscopy;  Laterality: N/A;  . ESOPHAGEAL BANDING N/A 06/19/2014   Procedure: ESOPHAGEAL BANDING;  Surgeon: [name], MD;  [facility];  Service: Endoscopy;  Laterality: N/A;  . ESOPHAGEAL BANDING N/A 01/05/2016   Procedure: ESOPHAGEAL BANDING;  Surgeon: [name], MD;  [facility];  Service: Endoscopy;  Laterality: N/A;  . ESOPHAGEAL BANDING N/A 08/03/2016   Procedure: ESOPHAGEAL BANDING;  Surgeon: [name], MD;  [facility];  Service: Endoscopy;  Laterality: N/A;  . ESOPHAGEAL BANDING N/A 05/02/2017   Procedure: ESOPHAGEAL BANDING;  Surgeon: [name], MD;  [facility];  Service: Endoscopy;  Laterality: N/A;  . ESOPHAGOGASTRODUODENOSCOPY  04/24/2012   Procedure: ESOPHAGOGASTRODUODENOSCOPY (EGD);  Surgeon: [name], MD;  [facility];  Service: Endoscopy;  Laterality: N/A;  300  . ESOPHAGOGASTRODUODENOSCOPY N/A 01/29/2013   Procedure: ESOPHAGOGASTRODUODENOSCOPY (EGD);  Surgeon: [name], MD;  [facility];  Service: Endoscopy;  Laterality: N/A;  1200  . ESOPHAGOGASTRODUODENOSCOPY N/A 05/22/2013   Procedure: ESOPHAGOGASTRODUODENOSCOPY (EGD);  Surgeon: [name], MD;  [facility];  Service: Endoscopy;  Laterality: N/A;  1:55  . ESOPHAGOGASTRODUODENOSCOPY N/A 12/31/2013   Procedure: ESOPHAGOGASTRODUODENOSCOPY (EGD);  Surgeon: [name], MD;  [facility];  Service: Endoscopy;  Laterality: N/A;  1200  . ESOPHAGOGASTRODUODENOSCOPY N/A 06/19/2014   Procedure:  ESOPHAGOGASTRODUODENOSCOPY (EGD);  Surgeon: [name], MD;  [facility];  Service: Endoscopy;  Laterality: N/A;  1055  . ESOPHAGOGASTRODUODENOSCOPY N/A 01/15/2015   Procedure: ESOPHAGOGASTRODUODENOSCOPY (EGD);  Surgeon: [name], MD;  [facility];  Service: Endoscopy;  Laterality: N/A;  730 - moved to 9:45 - moved to 1250-Ann notified pt  . ESOPHAGOGASTRODUODENOSCOPY N/A 06/30/2015   Procedure: ESOPHAGOGASTRODUODENOSCOPY (EGD);  Surgeon: [name], MD;  [facility];  Service: Endoscopy;  Laterality: N/A;  1200  . ESOPHAGOGASTRODUODENOSCOPY N/A 01/05/2016   Procedure: ESOPHAGOGASTRODUODENOSCOPY (EGD);  Surgeon: [name], MD;  [facility];  Service: Endoscopy;  Laterality: N/A;  830  . ESOPHAGOGASTRODUODENOSCOPY N/A 08/03/2016   Procedure: ESOPHAGOGASTRODUODENOSCOPY (EGD);  Surgeon: [name], MD;  [facility];  Service: Endoscopy;  Laterality: N/A;  930  . ESOPHAGOGASTRODUODENOSCOPY N/A 05/02/2017   Procedure: ESOPHAGOGASTRODUODENOSCOPY (EGD);  Surgeon: [name], MD;  [facility];  Service: Endoscopy;  Laterality: N/A;  12:00  . ESOPHAGOGASTRODUODENOSCOPY N/A 08/17/2017   Procedure: ESOPHAGOGASTRODUODENOSCOPY (EGD);  Surgeon: [name], MD;  [facility];  Service: Endoscopy;  Laterality: N/A;  8:15  . ESOPHAGOGASTRODUODENOSCOPY (EGD) WITH PROPOFOL  09/24/2012   Procedure: ESOPHAGOGASTRODUODENOSCOPY (EGD) WITH PROPOFOL;  Surgeon: [name], MD;  [facility];  Service: Endoscopy;  Laterality: N/A;  GE junction at 36  . ESOPHAGOGASTRODUODENOSCOPY (EGD) WITH PROPOFOL N/A 07/06/2017   Procedure: ESOPHAGOGASTRODUODENOSCOPY (EGD) WITH PROPOFOL;  Surgeon: [name], MD;  [facility];  Service: Endoscopy;  Laterality: N/A;  . ESOPHAGOGASTRODUODENOSCOPY W/ BANDING  08/2010  . GIVENS CAPSULE STUDY N/A 07/05/2017   Procedure: GIVENS CAPSULE STUDY;  Surgeon:  [name], MD;  [facility];  Service: Endoscopy;  Laterality: N/A;  . HOT HEMOSTASIS N/A 08/17/2017   Procedure: HOT HEMOSTASIS (ARGON PLASMA COAGULATION/BICAP);  Surgeon: [name], MD;  [facility];  Service: Endoscopy;  Laterality: N/A;  . POLYPECTOMY  03/24/2016   Procedure: POLYPECTOMY;  Surgeon: [name], MD;  [facility];  Service: Endoscopy;;  sigmoid polyp  . RIGHT HEART CATH N/A 04/16/2017   Procedure: Right Heart Cath;  Surgeon: [name], MD;  Location: [place] [facility];  Service: Cardiovascular;  Laterality: N/A;  . RIGHT HEART CATH N/A 07/12/2017   Procedure: RIGHT HEART CATH;  Surgeon: [name], MD;  Location: [place] [facility];  Service: Cardiovascular;  Laterality: N/A;  . TONSILLECTOMY    . UPPER GASTROINTESTINAL ENDOSCOPY  03/15/2011   EGD ED BANDING/TCS  . UPPER GASTROINTESTINAL ENDOSCOPY  09/05/2010  . UPPER GASTROINTESTINAL ENDOSCOPY  08/11/2010  . VAGINAL HYSTERECTOMY      Family History  Problem Relation Age of Onset  . Lung cancer Mother   . Diabetes Father   . Diabetes Sister   . Hypertension Sister   . Hypothyroidism Sister   . Colon cancer Brother   . Diabetes Sister   . Hypothyroidism Brother   . Healthy Daughter   .  Obesity Daughter   . Hypertension Daughter    Social History:  reports that  has never smoked. she has never used smokeless tobacco. She reports that she does not drink alcohol or use drugs.  Allergies:  Allergies  Allergen Reactions  . Feraheme [Ferumoxytol] Other (See Comments)    Patient has severe back and chest pain when receiving FERAHEME infusions.  Marland Kitchen Ultram [Tramadol Hcl] Other (See Comments)    WEAKNESS    Medications Prior to Admission  Medication Sig Dispense Refill  . cholecalciferol (VITAMIN D) 1000 UNITS tablet Take 1,000 Units by mouth 3 (three) times a week.     . fluticasone furoate-vilanterol (BREO ELLIPTA) 200-25 MCG/INH AEPB Inhale 1 puff  into the lungs daily. 1 each 3  . insulin aspart (NOVOLOG) 100 UNIT/ML FlexPen Use with given sliding scale  CBG 70-120: 0 units CBG 121-150: 1 unit CBG 151-200: 2 units CBG 201-250: 3 units CBG 251-300: 5 units CBG 301-350: 7 units CBG greater than 351: 9 units (Patient taking differently: Inject 3-10 Units into the skin 3 (three) times daily with meals. ) 15 mL 11  . Insulin Glargine (LANTUS SOLOSTAR) 100 UNIT/ML Solostar Pen Inject 38 Units daily at 10 pm into the skin. (Patient taking differently: Inject 42 Units into the skin daily at 10 pm. ) 15 mL 11  . Insulin Pen Needle 31G X 5 MM MISC Use with each insulin injection 100 each 12  . lactulose (CHRONULAC) 10 GM/15ML solution Take 30 mLs (20 g total) 4 (four) times daily by mouth. (Patient taking differently: Take 20 g by mouth 4 (four) times daily. Make take an additional 20 g up to two more times daily as needed for elevated ammonia levels) 240 mL 0  . levothyroxine (SYNTHROID, LEVOTHROID) 25 MCG tablet Take 25 mcg by mouth daily before breakfast.     . magnesium oxide (MAG-OX) 400 MG tablet Take 400 mg by mouth 2 (two) times a week.    . Multiple Vitamins-Iron (MULTIVITAMIN/IRON PO) Take 1 tablet by mouth daily.    . pantoprazole (PROTONIX) 40 MG tablet Take 1 tablet by mouth once daily 90 tablet 3  . PARoxetine (PAXIL) 40 MG tablet Take 40 mg by mouth every morning.    . potassium chloride SA (K-DUR,KLOR-CON) 20 MEQ tablet Take 1.5 tablets (30 mEq total) daily by mouth. 45 tablet 3  . propranolol (INDERAL) 20 MG tablet Take 1 tablet (20 mg total) by mouth at bedtime as needed (for increased BP). (Patient taking differently: Take 20 mg by mouth daily. My take an additional 20 mg as needed for increased BP) 30 tablet   . rosuvastatin (CRESTOR) 5 MG tablet Take 1 tablet (5 mg total) by mouth every other day. 45 tablet 3  . torsemide (DEMADEX) 20 MG tablet Take 1 tablet (20 mg total) daily by mouth. (Patient taking differently: Take 20 mg  by mouth daily. May take an additional 20 mg as needed for swelling) 30 tablet 6  . OXYGEN Inhale 2 L into the lungs at bedtime.       Results for orders placed or performed during the hospital encounter of 09/12/17 (from the past 48 hour(s))  Glucose, capillary     Status: Abnormal   Collection Time: 09/12/17  8:04 AM  Result Value Ref Range   Glucose-Capillary 171 (H) 65 - 99 mg/dL   No results found.  ROS  Blood pressure (!) 121/44, pulse 70, temperature 98.9 F (37.2 C), temperature source Oral, resp. rate  18, height 5' 4"  (1.626 m), weight 231 lb (104.8 kg), SpO2 100 %. Physical Exam  Constitutional: She appears well-developed and well-nourished.  Patient appears pale.  HENT:  Mouth/Throat: Oropharynx is clear and moist.  Eyes:  Conjunctiva is pale.  Sclerae nonicteric.  Neck: No thyromegaly present.  Cardiovascular:  Regular rhythm; normal S1-S2.  Faint systolic ejection murmur best heard at aortic area.  GI: Soft. She exhibits no distension and no mass. There is no tenderness.  Musculoskeletal: She exhibits edema.  2+ both legs  Lymphadenopathy:    She has no cervical adenopathy.  Neurological: She is alert.  Skin: Skin is warm and dry.     Assessment/Plan Decompensated cirrhosis secondary to NASH. GI bleed secondary to GAVE. History of esophageal cirrhosis. EGD with therapeutic intention.  Hildred Laser, MD 09/12/2017, 9:06 AM

## 2017-09-12 NOTE — Discharge Instructions (Signed)
Resume usual medications and diet as before. Spironolactone 50 mg by mouth twice daily. No driving for 24 hours. H&H in 2 weeks. Repeat EGD in 4 weeks.   Upper Endoscopy, Care After Refer to this sheet in the next few weeks. These instructions provide you with information about caring for yourself after your procedure. Your health care provider may also give you more specific instructions. Your treatment has been planned according to current medical practices, but problems sometimes occur. Call your health care provider if you have any problems or questions after your procedure. What can I expect after the procedure? After the procedure, it is common to have:  A sore throat.  Bloating.  Nausea.  Follow these instructions at home:  Follow instructions from your health care provider about what to eat or drink after your procedure.  Return to your normal activities as told by your health care provider. Ask your health care provider what activities are safe for you.  Take over-the-counter and prescription medicines only as told by your health care provider.  Do not drive for 24 hours if you received a sedative.  Keep all follow-up visits as told by your health care provider. This is important. Contact a health care provider if:  You have a sore throat that lasts longer than one day.  You have trouble swallowing. Get help right away if:  You have a fever.  You vomit blood or your vomit looks like coffee grounds.  You have bloody, black, or tarry stools.  You have a severe sore throat or you cannot swallow.  You have difficulty breathing.  You have severe pain in your chest or belly. This information is not intended to replace advice given to you by your health care provider. Make sure you discuss any questions you have with your health care provider. Document Released: 03/05/2012 Document Revised: 02/10/2016 Document Reviewed: 06/17/2015 Elsevier Interactive Patient  Education  [employer].

## 2017-09-12 NOTE — Op Note (Signed)
[facility] Patient Name: Maria [name] Procedure Date: 09/12/2017 8:29 AM MRN: [id number] Date of Birth: [birth year] Attending MD: Hildred Laser , MD CSN: [id number] Age: 61 Admit Type: Outpatient Procedure:                Upper GI endoscopy Indications:              Therapeutic procedure, Suspected upper                            gastrointestinal bleeding in patient with chronic                            blood loss due to GAVE. Providers:                Hildred Laser, MD, Jeanann Lewandowsky. Sharon Seller, RN, Randa Spike, Technician, Nelma Rothman, Technician Referring MD:             Glenda Chroman, MD Medicines:                Lidocaine spray, Meperidine 50 mg IV, Midazolam 10                            mg IV Complications:            No immediate complications. Estimated Blood Loss:     Estimated blood loss: none. Procedure:                Pre-Anesthesia Assessment:                           - Prior to the procedure, a History and Physical                            was performed, and patient medications and                            allergies were reviewed. The patient's tolerance of                            previous anesthesia was also reviewed. The risks                            and benefits of the procedure and the sedation                            options and risks were discussed with the patient.                            All questions were answered, and informed consent                            was obtained. Prior Anticoagulants: The patient has  taken no previous anticoagulant or antiplatelet                            agents. ASA Grade Assessment: III - A patient with                            severe systemic disease. After reviewing the risks                            and benefits, the patient was deemed in                            satisfactory condition to undergo the procedure.  After obtaining informed consent, the endoscope was                            passed under direct vision. Throughout the                            procedure, the patient's blood pressure, pulse, and                            oxygen saturations were monitored continuously. The                            [id number] ([id number]) scope was introduced through the                            mouth, and advanced to the second part of duodenum.                            The upper GI endoscopy was accomplished without                            difficulty. The patient tolerated the procedure                            well. Scope In: 9:31:58 AM Scope Out: 9:44:12 AM Total Procedure Duration: 0 hours 12 minutes 14 seconds  Findings:      A post variceal banding scar was found in the distal esophagus.      The exam of the esophagus was otherwise normal.      The Z-line was regular and was found 35 cm from the incisors.      Moderate portal hypertensive gastropathy was found in the gastric fundus       and in the gastric body.      Severe gastric antral vascular ectasia with bleeding was present in the       gastric antrum and in the prepyloric region of the stomach. Coagulation       for hemostasis using argon plasma was successful.      Patchy mildly congested mucosa without active bleeding and with no       stigmata of bleeding was found in the duodenal bulb.      The second portion of the duodenum was normal. Impression:               -  Scar in the distal esophagus.                           - Z-line regular, 35 cm from the incisors.                           - Portal hypertensive gastropathy.                           - Gastric antral vascular ectasia with bleeding.                            Treated with argon plasma coagulation (APC). all of                            the bleeding lesions were coagulated.                           - Congested duodenal mucosa.                           -  Normal second portion of the duodenum.                           - No specimens collected. Moderate Sedation:      Moderate (conscious) sedation was administered by the endoscopy nurse       and supervised by the endoscopist. The following parameters were       monitored: oxygen saturation, heart rate, blood pressure, CO2       capnography and response to care. Total physician intraservice time was       20 minutes. Recommendation:           - Patient has a contact number available for                            emergencies. The signs and symptoms of potential                            delayed complications were discussed with the                            patient. Return to normal activities tomorrow.                            Written discharge instructions were provided to the                            patient.                           - Resume previous diet today.                           - Continue present medications.                           -  Two units of PRBCs today.                           - Repeat upper endoscopy in 4 weeks. Procedure Code(s):        --- Professional ---                           [id number], Esophagogastroduodenoscopy, flexible,                            transoral; with control of bleeding, any method                           99152, Moderate sedation services provided by the                            same physician or other qualified health care                            professional performing the diagnostic or                            therapeutic service that the sedation supports,                            requiring the presence of an independent trained                            observer to assist in the monitoring of the                            patient's level of consciousness and physiological                            status; initial 15 minutes of intraservice time,                            patient age 17 years or older Diagnosis  Code(s):        --- Professional ---                           K22.8, Other specified diseases of esophagus                           K76.6, Portal hypertension                           K31.89, Other diseases of stomach and duodenum                           K31.811, Angiodysplasia of stomach and duodenum                            with bleeding  R58, Hemorrhage, not elsewhere classified CPT copyright 2016 American Medical Association. All rights reserved. The codes documented in this report are preliminary and upon coder review may  be revised to meet current compliance requirements. Hildred Laser, MD Hildred Laser, MD 09/12/2017 10:00:40 AM This report has been signed electronically. Number of Addenda: 0

## 2017-09-13 ENCOUNTER — Other Ambulatory Visit (INDEPENDENT_AMBULATORY_CARE_PROVIDER_SITE_OTHER): Payer: Self-pay | Admitting: *Deleted

## 2017-09-13 ENCOUNTER — Encounter (INDEPENDENT_AMBULATORY_CARE_PROVIDER_SITE_OTHER): Payer: Self-pay | Admitting: *Deleted

## 2017-09-13 DIAGNOSIS — D508 Other iron deficiency anemias: Secondary | ICD-10-CM

## 2017-09-13 DIAGNOSIS — K7469 Other cirrhosis of liver: Secondary | ICD-10-CM

## 2017-09-13 DIAGNOSIS — D649 Anemia, unspecified: Secondary | ICD-10-CM | POA: Insufficient documentation

## 2017-09-13 DIAGNOSIS — I8501 Esophageal varices with bleeding: Secondary | ICD-10-CM

## 2017-09-13 DIAGNOSIS — K31819 Angiodysplasia of stomach and duodenum without bleeding: Secondary | ICD-10-CM

## 2017-09-13 LAB — TYPE AND SCREEN
ABO/RH(D): O NEG
Antibody Screen: NEGATIVE
Unit division: 0
Unit division: 0

## 2017-09-13 LAB — BPAM RBC
Blood Product Expiration Date: 201901172359
Blood Product Expiration Date: 201901272359
ISSUE DATE / TIME: 201812261051
ISSUE DATE / TIME: 201812261239
Unit Type and Rh: 9500
Unit Type and Rh: 9500

## 2017-09-14 ENCOUNTER — Encounter (HOSPITAL_COMMUNITY): Payer: Self-pay | Admitting: Internal Medicine

## 2017-09-24 DIAGNOSIS — R0609 Other forms of dyspnea: Secondary | ICD-10-CM | POA: Diagnosis not present

## 2017-09-26 DIAGNOSIS — D508 Other iron deficiency anemias: Secondary | ICD-10-CM | POA: Diagnosis not present

## 2017-09-26 LAB — HEMOGLOBIN AND HEMATOCRIT, BLOOD
HCT: 26.8 % — ABNORMAL LOW (ref 35.0–45.0)
Hemoglobin: 8 g/dL — ABNORMAL LOW (ref 11.7–15.5)

## 2017-10-03 ENCOUNTER — Encounter (INDEPENDENT_AMBULATORY_CARE_PROVIDER_SITE_OTHER): Payer: Self-pay | Admitting: *Deleted

## 2017-10-04 ENCOUNTER — Encounter (INDEPENDENT_AMBULATORY_CARE_PROVIDER_SITE_OTHER): Payer: Self-pay | Admitting: *Deleted

## 2017-10-05 ENCOUNTER — Other Ambulatory Visit (INDEPENDENT_AMBULATORY_CARE_PROVIDER_SITE_OTHER): Payer: Self-pay | Admitting: *Deleted

## 2017-10-05 MED ORDER — PROPRANOLOL HCL 20 MG PO TABS: 20.0000 mg | ORAL_TABLET | Freq: Every evening | ORAL | 3 refills | Status: DC | PRN

## 2017-10-09 ENCOUNTER — Ambulatory Visit (HOSPITAL_COMMUNITY)
Admission: RE | Admit: 2017-10-09 | Discharge: 2017-10-09 | Disposition: A | Discharge status: Home / Self Care | Payer: PPO | Source: Ambulatory Visit | Attending: Cardiology | Admitting: Cardiology

## 2017-10-09 ENCOUNTER — Encounter (HOSPITAL_COMMUNITY): Payer: Self-pay | Admitting: Cardiology

## 2017-10-09 VITALS — BP 160/93 | HR 90 | Wt 217.2 lb

## 2017-10-09 DIAGNOSIS — R161 Splenomegaly, not elsewhere classified: Secondary | ICD-10-CM | POA: Insufficient documentation

## 2017-10-09 DIAGNOSIS — Z79890 Hormone replacement therapy (postmenopausal): Secondary | ICD-10-CM | POA: Diagnosis not present

## 2017-10-09 DIAGNOSIS — R188 Other ascites: Secondary | ICD-10-CM | POA: Insufficient documentation

## 2017-10-09 DIAGNOSIS — K31811 Angiodysplasia of stomach and duodenum with bleeding: Secondary | ICD-10-CM | POA: Diagnosis not present

## 2017-10-09 DIAGNOSIS — Z794 Long term (current) use of insulin: Secondary | ICD-10-CM | POA: Insufficient documentation

## 2017-10-09 DIAGNOSIS — I5032 Chronic diastolic (congestive) heart failure: Secondary | ICD-10-CM | POA: Diagnosis not present

## 2017-10-09 DIAGNOSIS — K7290 Hepatic failure, unspecified without coma: Secondary | ICD-10-CM | POA: Diagnosis not present

## 2017-10-09 DIAGNOSIS — I110 Hypertensive heart disease with heart failure: Secondary | ICD-10-CM | POA: Insufficient documentation

## 2017-10-09 DIAGNOSIS — K7581 Nonalcoholic steatohepatitis (NASH): Secondary | ICD-10-CM | POA: Insufficient documentation

## 2017-10-09 DIAGNOSIS — E785 Hyperlipidemia, unspecified: Secondary | ICD-10-CM | POA: Insufficient documentation

## 2017-10-09 DIAGNOSIS — I2510 Atherosclerotic heart disease of native coronary artery without angina pectoris: Secondary | ICD-10-CM | POA: Diagnosis not present

## 2017-10-09 DIAGNOSIS — D649 Anemia, unspecified: Secondary | ICD-10-CM | POA: Diagnosis not present

## 2017-10-09 DIAGNOSIS — K7460 Unspecified cirrhosis of liver: Secondary | ICD-10-CM | POA: Diagnosis not present

## 2017-10-09 DIAGNOSIS — Z79899 Other long term (current) drug therapy: Secondary | ICD-10-CM | POA: Insufficient documentation

## 2017-10-09 DIAGNOSIS — D696 Thrombocytopenia, unspecified: Secondary | ICD-10-CM | POA: Insufficient documentation

## 2017-10-09 DIAGNOSIS — E119 Type 2 diabetes mellitus without complications: Secondary | ICD-10-CM | POA: Insufficient documentation

## 2017-10-09 DIAGNOSIS — I272 Pulmonary hypertension, unspecified: Secondary | ICD-10-CM | POA: Diagnosis not present

## 2017-10-09 LAB — BASIC METABOLIC PANEL
Anion gap: 12 (ref 5–15)
BUN: 5 mg/dL — ABNORMAL LOW (ref 6–20)
CO2: 23 mmol/L (ref 22–32)
Calcium: 8.1 mg/dL — ABNORMAL LOW (ref 8.9–10.3)
Chloride: 102 mmol/L (ref 101–111)
Creatinine, Ser: 0.91 mg/dL (ref 0.44–1.00)
GFR calc Af Amer: >60 mL/min (ref >60)
GFR calc non Af Amer: >60 mL/min (ref >60)
Glucose, Bld: 216 mg/dL — ABNORMAL HIGH (ref 65–99)
Potassium: 3.3 mmol/L — ABNORMAL LOW (ref 3.5–5.1)
Sodium: 137 mmol/L (ref 135–145)

## 2017-10-09 MED ORDER — TORSEMIDE 20 MG PO TABS: ORAL_TABLET | ORAL | 6 refills | Status: DC

## 2017-10-09 NOTE — Patient Instructions (Signed)
Increase Torsemide 40 mg (2 tabs) daily for 3 days  -Then take 40 (2 tabs) daily, alternating 20 (1 tab) daily   Labs drawn today (if we do not call you, then your lab work was stable)   Your physician recommends that you return for lab work in: 10 days  Your physician recommends that you schedule a follow-up appointment in: 1 month with [facility].

## 2017-10-10 ENCOUNTER — Ambulatory Visit (HOSPITAL_COMMUNITY)
Admission: RE | Admit: 2017-10-10 | Discharge: 2017-10-10 | Disposition: A | Discharge status: Home / Self Care | Payer: PPO | Source: Ambulatory Visit | Attending: Internal Medicine | Admitting: Internal Medicine

## 2017-10-10 ENCOUNTER — Encounter (HOSPITAL_COMMUNITY): Payer: Self-pay | Admitting: *Deleted

## 2017-10-10 ENCOUNTER — Encounter (HOSPITAL_COMMUNITY)
Admission: RE | Disposition: A | Discharge status: Home / Self Care | Payer: Self-pay | Source: Ambulatory Visit | Attending: Internal Medicine

## 2017-10-10 ENCOUNTER — Other Ambulatory Visit: Payer: Self-pay

## 2017-10-10 DIAGNOSIS — Q2733 Arteriovenous malformation of digestive system vessel: Secondary | ICD-10-CM | POA: Diagnosis not present

## 2017-10-10 DIAGNOSIS — K31819 Angiodysplasia of stomach and duodenum without bleeding: Secondary | ICD-10-CM | POA: Insufficient documentation

## 2017-10-10 DIAGNOSIS — K760 Fatty (change of) liver, not elsewhere classified: Secondary | ICD-10-CM | POA: Insufficient documentation

## 2017-10-10 DIAGNOSIS — K7469 Other cirrhosis of liver: Secondary | ICD-10-CM | POA: Insufficient documentation

## 2017-10-10 DIAGNOSIS — I509 Heart failure, unspecified: Secondary | ICD-10-CM | POA: Diagnosis not present

## 2017-10-10 DIAGNOSIS — K7460 Unspecified cirrhosis of liver: Secondary | ICD-10-CM | POA: Diagnosis not present

## 2017-10-10 DIAGNOSIS — E039 Hypothyroidism, unspecified: Secondary | ICD-10-CM | POA: Insufficient documentation

## 2017-10-10 DIAGNOSIS — K3189 Other diseases of stomach and duodenum: Secondary | ICD-10-CM | POA: Diagnosis not present

## 2017-10-10 DIAGNOSIS — Z79899 Other long term (current) drug therapy: Secondary | ICD-10-CM | POA: Diagnosis not present

## 2017-10-10 DIAGNOSIS — Z888 Allergy status to other drugs, medicaments and biological substances status: Secondary | ICD-10-CM | POA: Insufficient documentation

## 2017-10-10 DIAGNOSIS — E119 Type 2 diabetes mellitus without complications: Secondary | ICD-10-CM | POA: Diagnosis not present

## 2017-10-10 DIAGNOSIS — I110 Hypertensive heart disease with heart failure: Secondary | ICD-10-CM | POA: Insufficient documentation

## 2017-10-10 DIAGNOSIS — M1990 Unspecified osteoarthritis, unspecified site: Secondary | ICD-10-CM | POA: Insufficient documentation

## 2017-10-10 DIAGNOSIS — K766 Portal hypertension: Secondary | ICD-10-CM | POA: Diagnosis not present

## 2017-10-10 DIAGNOSIS — Z794 Long term (current) use of insulin: Secondary | ICD-10-CM | POA: Insufficient documentation

## 2017-10-10 DIAGNOSIS — I8501 Esophageal varices with bleeding: Secondary | ICD-10-CM | POA: Insufficient documentation

## 2017-10-10 DIAGNOSIS — Z885 Allergy status to narcotic agent status: Secondary | ICD-10-CM | POA: Diagnosis not present

## 2017-10-10 DIAGNOSIS — I8500 Esophageal varices without bleeding: Secondary | ICD-10-CM | POA: Diagnosis not present

## 2017-10-10 DIAGNOSIS — D649 Anemia, unspecified: Secondary | ICD-10-CM | POA: Diagnosis not present

## 2017-10-10 DIAGNOSIS — I8510 Secondary esophageal varices without bleeding: Secondary | ICD-10-CM | POA: Insufficient documentation

## 2017-10-10 DIAGNOSIS — E7800 Pure hypercholesterolemia, unspecified: Secondary | ICD-10-CM | POA: Insufficient documentation

## 2017-10-10 DIAGNOSIS — F329 Major depressive disorder, single episode, unspecified: Secondary | ICD-10-CM | POA: Insufficient documentation

## 2017-10-10 DIAGNOSIS — M797 Fibromyalgia: Secondary | ICD-10-CM | POA: Diagnosis not present

## 2017-10-10 DIAGNOSIS — K449 Diaphragmatic hernia without obstruction or gangrene: Secondary | ICD-10-CM | POA: Diagnosis not present

## 2017-10-10 DIAGNOSIS — D508 Other iron deficiency anemias: Secondary | ICD-10-CM

## 2017-10-10 DIAGNOSIS — M810 Age-related osteoporosis without current pathological fracture: Secondary | ICD-10-CM | POA: Insufficient documentation

## 2017-10-10 HISTORY — PX: HOT HEMOSTASIS: SHX5433

## 2017-10-10 HISTORY — PX: ESOPHAGOGASTRODUODENOSCOPY: SHX5428

## 2017-10-10 LAB — HEMOGLOBIN AND HEMATOCRIT, BLOOD
HCT: 31.5 % — ABNORMAL LOW (ref 36.0–46.0)
Hemoglobin: 9.1 g/dL — ABNORMAL LOW (ref 12.0–15.0)

## 2017-10-10 LAB — GLUCOSE, CAPILLARY
Glucose-Capillary: 135 mg/dL — ABNORMAL HIGH (ref 65–99)
Glucose-Capillary: 85 mg/dL (ref 65–99)

## 2017-10-10 SURGERY — EGD (ESOPHAGOGASTRODUODENOSCOPY)
Anesthesia: Moderate Sedation

## 2017-10-10 MED ORDER — DEXTROSE 50 % IV SOLN
INTRAVENOUS | Status: AC
  Filled 2017-10-10: qty 50

## 2017-10-10 MED ORDER — MEPERIDINE HCL 50 MG/ML IJ SOLN
INTRAMUSCULAR | Status: AC
  Filled 2017-10-10: qty 1

## 2017-10-10 MED ORDER — MIDAZOLAM HCL 5 MG/5ML IJ SOLN
INTRAMUSCULAR | Status: DC | PRN
  Administered 2017-10-10 (×4): 2 mg via INTRAVENOUS

## 2017-10-10 MED ORDER — LIDOCAINE VISCOUS 2 % MT SOLN
OROMUCOSAL | Status: DC | PRN
  Administered 2017-10-10: 1 via OROMUCOSAL

## 2017-10-10 MED ORDER — SODIUM CHLORIDE 0.9 % IV SOLN
INTRAVENOUS | Status: DC
  Administered 2017-10-10: 1000 mL via INTRAVENOUS

## 2017-10-10 MED ORDER — MEPERIDINE HCL 50 MG/ML IJ SOLN
INTRAMUSCULAR | Status: DC | PRN
  Administered 2017-10-10 (×2): 25 mg via INTRAVENOUS

## 2017-10-10 MED ORDER — MIDAZOLAM HCL 5 MG/5ML IJ SOLN
INTRAMUSCULAR | Status: AC
  Filled 2017-10-10: qty 10

## 2017-10-10 MED ORDER — DEXTROSE 50 % IV SOLN
12.5000 g | Freq: Once | INTRAVENOUS | Status: AC
  Administered 2017-10-10: 12.5 g via INTRAVENOUS

## 2017-10-10 MED ORDER — LIDOCAINE VISCOUS 2 % MT SOLN
OROMUCOSAL | Status: AC
  Filled 2017-10-10: qty 15

## 2017-10-10 MED ORDER — STERILE WATER FOR IRRIGATION IR SOLN
Status: DC | PRN
  Administered 2017-10-10: 15 mL

## 2017-10-10 NOTE — Progress Notes (Signed)
PCP: Dr. Woody Seller HF Cardiology: Dr. Aundra Dubin  62 y.o. sent for CHF clinic evaluation due to progressive dyspnea.  Patient had been having exertional dypsnea for a year or so prior to initial appointment.  Echo in 3/18 showed mild LVH and mild MR with normal EF.  High resolution CT chest in 5/18 did not have evidence for interstitial lung disease.   She additionally has history of NASH with cirrhosis and variceal bleeding as well as splenomegaly, followed by Dr. Laural Golden.   She was sent for right heart cath as there was worry for possible portopulmonary hypertension.  RHC showed elevated right and left heart filling pressures with pulmonary venous hypertension.  She had been taking Lasix prn.  After the cath, I increased the Lasix to 40 mg daily.   She was admitted in 10/18 with symptomatic anemia and got 3 units PRBCs. EGD showed GAVE that was treated with APC.  She was diuresed with IV Lasix that admission.   Repeat RHC in 10/18 showed normal filling pressures with mild pulmonary hypertension.  PVR not elevated.    She was admitted again in 10/18 with acute encephalopathy.  She was on torsemide 60 mg daily when admitted.  NH3 was 85, hepatic encephalopathy suspected.  AKI also noted.  Lactulose was started.  Possible that HE was triggered by dehydration.  Torsemide was stopped then later restarted with increase in weight.   She returns for followup of diastolic CHF.  Weight is down 1 lb compared to prior visit. She has developed increased peripheral edema. BP is elevated today but she has not taken spironolactone or torsemide yet.  Main complaint is fatigue.  She does ok walking around in the house.  She is short of breath carrying laundary, dressing, bathing.  She is short of breath walking 100-200 feet.  Can walk to mailbox without much trouble.  No lightheadedness or syncope. She is going to have EGD tomorrow.     Labs (7/18): K 3.9, creatinine 0.8, hgb 8.1    Labs (11/18): K 3.7, creatinine 1.03 =>  0.93 Labs (1/19): hgb 8                                                                                                       PMH:  1. NASH with cirrhosis: H/o variceal bleeding.  2. Splenomegaly related to cirrhosis.  3. Sleep study 6/8 showed no significant OSA.  4. High resolution CT chest (5/18): No interstitial lung disease, +calcium in coronaries.  5. Chronic diastolic CHF:  - Echo (8/67): EF 60-65%, mild LVH, mild MR.  Negative bubble study.  - RHC (7/18): mean RA 16, PA 64/25, mean PCWP 21, CI 4.16, PVR 2.6 WU.  - RHC (10/18): mean RA 4, PA 39/15 mean 26, mean PCWP 11, CI 6.4, PVR 1.1 WU 6. Anemia 7. Thrombocytopenia (likely related to splenomegaly).  8. Upper GI bleeding: 10/18, GAVE treated with APC.  9. Diabetes: Insulin pump.  10. Hyperlipidemia  Social History   Socioeconomic History  . Marital status: Married    Spouse name: Not on file  .  Number of children: Not on file  . Years of education: Not on file  . Highest education level: Not on file  Social Needs  . Financial resource strain: Not on file  . Food insecurity - worry: Not on file  . Food insecurity - inability: Not on file  . Transportation needs - medical: Not on file  . Transportation needs - non-medical: Not on file  Occupational History  . Occupation: retired   Tobacco Use  . Smoking status: Never Smoker  . Smokeless tobacco: Never Used  Substance and Sexual Activity  . Alcohol use: No    Alcohol/week: 0.0 oz  . Drug use: No  . Sexual activity: Not on file  Other Topics Concern  . Not on file  Social History Narrative   Lives with husband    Family History  Problem Relation Age of Onset  . Lung cancer Mother   . Diabetes Father   . Diabetes Sister   . Hypertension Sister   . Hypothyroidism Sister   . Colon cancer Brother   . Diabetes Sister   . Hypothyroidism Brother   . Healthy Daughter   . Obesity Daughter   . Hypertension Daughter    ROS: All systems reviewed and negatie  except as per HPI.   Current Outpatient Medications  Medication Sig Dispense Refill  . cholecalciferol (VITAMIN D) 1000 UNITS tablet Take 1,000 Units by mouth 3 (three) times a week.     . fluticasone furoate-vilanterol (BREO ELLIPTA) 200-25 MCG/INH AEPB Inhale 1 puff into the lungs daily. 1 each 3  . insulin aspart (NOVOLOG) 100 UNIT/ML FlexPen Use with given sliding scale  CBG 70-120: 0 units CBG 121-150: 1 unit CBG 151-200: 2 units CBG 201-250: 3 units CBG 251-300: 5 units CBG 301-350: 7 units CBG greater than 351: 9 units (Patient taking differently: Inject 3-10 Units into the skin 3 (three) times daily with meals. ) 15 mL 11  . Insulin Glargine (LANTUS SOLOSTAR) 100 UNIT/ML Solostar Pen Inject 38 Units daily at 10 pm into the skin. (Patient taking differently: Inject 42 Units into the skin daily at 10 pm. ) 15 mL 11  . Insulin Pen Needle 31G X 5 MM MISC Use with each insulin injection 100 each 12  . lactulose (CHRONULAC) 10 GM/15ML solution Take 30 mLs (20 g total) 4 (four) times daily by mouth. (Patient taking differently: Take 20 g by mouth 4 (four) times daily. Make take an additional 20 g up to two more times daily as needed for elevated ammonia levels) 240 mL 0  . levothyroxine (SYNTHROID, LEVOTHROID) 25 MCG tablet Take 25 mcg by mouth daily before breakfast.     . magnesium oxide (MAG-OX) 400 MG tablet Take 400 mg by mouth 2 (two) times a week.    . Multiple Vitamins-Iron (MULTIVITAMIN/IRON PO) Take 1 tablet by mouth daily.    . OXYGEN Inhale 2 L into the lungs at bedtime.     . pantoprazole (PROTONIX) 40 MG tablet Take 1 tablet by mouth once daily 90 tablet 3  . PARoxetine (PAXIL) 40 MG tablet Take 40 mg by mouth every morning.    . potassium chloride SA (K-DUR,KLOR-CON) 20 MEQ tablet Take 1.5 tablets (30 mEq total) daily by mouth. 45 tablet 3  . propranolol (INDERAL) 20 MG tablet Take 1 tablet (20 mg total) by mouth at bedtime as needed (for increased BP). 90 tablet 3  .  rosuvastatin (CRESTOR) 5 MG tablet Take 1 tablet (5 mg total) by  mouth every other day. 45 tablet 3  . spironolactone (ALDACTONE) 50 MG tablet Take 1 tablet (50 mg total) by mouth 2 (two) times daily. 60 tablet 5  . torsemide (DEMADEX) 20 MG tablet Take 40 mg (2 tabs) daily, alternating with 20 mg (1 tab) daily 45 tablet 6   No current facility-administered medications for this encounter.    BP (!) 160/93   Pulse 90   Wt 217 lb 4 oz (98.5 kg)   SpO2 95%   BMI 37.29 kg/m  General: NAD Neck: JVP 8-9 cm, no thyromegaly or thyroid nodule.  Lungs: Clear to auscultation bilaterally with normal respiratory effort. CV: Nondisplaced PMI.  Heart regular S1/S2, no S3/S4, no murmur.  1+ edema 1/2 up lower legs bilaterally.  No carotid bruit.  Normal pedal pulses.  Abdomen: Soft, nontender, no hepatosplenomegaly, mild distention.  Skin: Intact without lesions or rashes.  Neurologic: Alert and oriented x 3.  Psych: Normal affect. Extremities: No clubbing or cyanosis.  HEENT: Normal.   Assessment/Plan: 1. Chronic diastolic CHF: RHC in 2/56 is suggestive of significant diastolic CHF.  EF was normal by echo. Repeat RHC 10/18 showed normal filling pressures on torsemide.  On exam today, she appears volume overloaded. NYHA class III symptoms.   - Increase torsemide to 40 mg daily x 3 days then 40 mg daily alternating with 20 mg daily. BMET today and again in 10 day.  - She remains on spironolactone 50 mg bid given cirrhosis with ascites.  2. Pulmonary hypertension: Patient is at risk for portopulmonary hypertension.   However, RHCs in 7/18 and in 10/18 showed pulmonary venous hypertension (group 2).   3. Cirrhosis: NASH with splenomegaly, thrombocytopenia.   - She is on lactulose for hepatic encephalopathy.  - To have EGD tomorrow. 4. CAD: Coronary calcium on CT chest in 5/18.  No chest pain.  - In absence of chest pain, would not do stress test or cath => poor candidate for invasive cardiac evaluation  and stent placement with very low platelets.  - Continue Crestor 5 mg every other day.   5. Anemia: Followed by her GI MD.  Recent upper GI bleeding from GAVE. EGD tomorrow.  6. Thrombocytopenia: Likely related to cirrhosis and splenomegaly.   Followup in 4 weeks with NP/PA.   Maria [name] 10/10/2017

## 2017-10-10 NOTE — H&P (Addendum)
Maria [name] is an 62 y.o. female.   Chief Complaint: Patient is here for EGD with APC of GAVE. HPI: Patient is 62 year old Caucasian female who was cirrhosis secondary to NASH complicated by esophageal varices for which she is undergone banding on multiple occasions.  She now has developed gastric antral vascular vascular ectasia with bleeding.  She underwent EGD on 09/12/2017 when she was actively bleeding.  She is returning for repeat therapy of APC.  Her hemoglobin 2 weeks ago was 8 g and today is 9.1 g.  At times she has dark stools but she has not had frank melena.  She also denies rectal bleeding or abdominal pain.  Past Medical History:  Diagnosis Date  . Anemia   . CHF (congestive heart failure) ([place])   . Cirrhosis ([place])   . Depression   . Diabetes mellitus   . Esophageal bleed, non-variceal   . Eye hemorrhage    Behind left eye  . Fibromyalgia   . Hypercholesteremia   . Hypertension   . Hypothyroidism   . NAFLD (nonalcoholic fatty liver disease)   . Osteoarthrosis   . Osteoporosis   . PONV (postoperative nausea and vomiting)   . UTI (urinary tract infection) 11/12 end of the month   Patient feels that she passed a kidney stone at that time    Past Surgical History:  Procedure Laterality Date  . APPENDECTOMY  1980  . BILATERAL SALPINGOOPHORECTOMY    . CHOLECYSTECTOMY    . COLONOSCOPY  03/15/2011  . COLONOSCOPY N/A 03/24/2016   Procedure: COLONOSCOPY;  Surgeon: [name], MD;  [facility];  Service: Endoscopy;  Laterality: N/A;  855  . ESOPHAGEAL BANDING  04/24/2012   Procedure: ESOPHAGEAL BANDING;  Surgeon: [name], MD;  [facility];  Service: Endoscopy;  Laterality: N/A;  . Esophageal BANDING  08/03/2012   [facility] [place] , [place]  09/24/2012   Procedure: ESOPHAGEAL BANDING;  Surgeon: [name], MD;  [facility]: [facility];  Service: Endoscopy;  Laterality: N/A;  Banding x 3  . ESOPHAGEAL  BANDING N/A 01/29/2013   Procedure: ESOPHAGEAL BANDING;  Surgeon: [name], MD;  [facility];  Service: Endoscopy;  Laterality: N/A;  . ESOPHAGEAL BANDING N/A 06/19/2014   Procedure: ESOPHAGEAL BANDING;  Surgeon: [name], MD;  [facility];  Service: Endoscopy;  Laterality: N/A;  . ESOPHAGEAL BANDING N/A 01/05/2016   Procedure: ESOPHAGEAL BANDING;  Surgeon: [name], MD;  [facility];  Service: Endoscopy;  Laterality: N/A;  . ESOPHAGEAL BANDING N/A 08/03/2016   Procedure: ESOPHAGEAL BANDING;  Surgeon: [name], MD;  [facility];  Service: Endoscopy;  Laterality: N/A;  . ESOPHAGEAL BANDING N/A 05/02/2017   Procedure: ESOPHAGEAL BANDING;  Surgeon: [name], MD;  [facility];  Service: Endoscopy;  Laterality: N/A;  . ESOPHAGOGASTRODUODENOSCOPY  04/24/2012   Procedure: ESOPHAGOGASTRODUODENOSCOPY (EGD);  Surgeon: [name], MD;  [facility];  Service: Endoscopy;  Laterality: N/A;  300  . ESOPHAGOGASTRODUODENOSCOPY N/A 01/29/2013   Procedure: ESOPHAGOGASTRODUODENOSCOPY (EGD);  Surgeon: [name], MD;  [facility];  Service: Endoscopy;  Laterality: N/A;  1200  . ESOPHAGOGASTRODUODENOSCOPY N/A 05/22/2013   Procedure: ESOPHAGOGASTRODUODENOSCOPY (EGD);  Surgeon: [name], MD;  [facility];  Service: Endoscopy;  Laterality: N/A;  1:55  . ESOPHAGOGASTRODUODENOSCOPY N/A 12/31/2013   Procedure: ESOPHAGOGASTRODUODENOSCOPY (EGD);  Surgeon: [name], MD;  [facility];  Service: Endoscopy;  Laterality: N/A;  1200  . ESOPHAGOGASTRODUODENOSCOPY N/A 06/19/2014   Procedure: ESOPHAGOGASTRODUODENOSCOPY (EGD);  Surgeon: [name], MD;  [facility];  Service: Endoscopy;  Laterality: N/A;  1055  . ESOPHAGOGASTRODUODENOSCOPY N/A 01/15/2015   Procedure: ESOPHAGOGASTRODUODENOSCOPY (EGD);  Surgeon: [name], MD;  [facility];   Service: Endoscopy;  Laterality: N/A;  730 - moved to 9:45 - moved to 1250-Ann notified pt  . ESOPHAGOGASTRODUODENOSCOPY N/A 06/30/2015   Procedure: ESOPHAGOGASTRODUODENOSCOPY (EGD);  Surgeon: [name], MD;  [facility];  Service: Endoscopy;  Laterality: N/A;  1200  . ESOPHAGOGASTRODUODENOSCOPY N/A 01/05/2016   Procedure: ESOPHAGOGASTRODUODENOSCOPY (EGD);  Surgeon: [name], MD;  [facility];  Service: Endoscopy;  Laterality: N/A;  830  . ESOPHAGOGASTRODUODENOSCOPY N/A 08/03/2016   Procedure: ESOPHAGOGASTRODUODENOSCOPY (EGD);  Surgeon: [name], MD;  [facility];  Service: Endoscopy;  Laterality: N/A;  930  . ESOPHAGOGASTRODUODENOSCOPY N/A 05/02/2017   Procedure: ESOPHAGOGASTRODUODENOSCOPY (EGD);  Surgeon: [name], MD;  [facility];  Service: Endoscopy;  Laterality: N/A;  12:00  . ESOPHAGOGASTRODUODENOSCOPY N/A 08/17/2017   Procedure: ESOPHAGOGASTRODUODENOSCOPY (EGD);  Surgeon: [name], MD;  [facility];  Service: Endoscopy;  Laterality: N/A;  8:15  . ESOPHAGOGASTRODUODENOSCOPY N/A 09/12/2017   Procedure: ESOPHAGOGASTRODUODENOSCOPY (EGD);  Surgeon: [name], MD;  [facility];  Service: Endoscopy;  Laterality: N/A;  1040  . ESOPHAGOGASTRODUODENOSCOPY (EGD) WITH PROPOFOL  09/24/2012   Procedure: ESOPHAGOGASTRODUODENOSCOPY (EGD) WITH PROPOFOL;  Surgeon: [name], MD;  [facility];  Service: Endoscopy;  Laterality: N/A;  GE junction at 36  . ESOPHAGOGASTRODUODENOSCOPY (EGD) WITH PROPOFOL N/A 07/06/2017   Procedure: ESOPHAGOGASTRODUODENOSCOPY (EGD) WITH PROPOFOL;  Surgeon: [name], MD;  [facility];  Service: Endoscopy;  Laterality: N/A;  . ESOPHAGOGASTRODUODENOSCOPY W/ BANDING  08/2010  . GIVENS CAPSULE STUDY N/A 07/05/2017   Procedure: GIVENS CAPSULE STUDY;  Surgeon: [name], MD;  [facility];  Service: Endoscopy;  Laterality: N/A;   . HOT HEMOSTASIS N/A 08/17/2017   Procedure: HOT HEMOSTASIS (ARGON PLASMA COAGULATION/BICAP);  Surgeon: [name], MD;  [facility];  Service: Endoscopy;  Laterality: N/A;  . HOT HEMOSTASIS  09/12/2017   Procedure: HOT HEMOSTASIS (ARGON PLASMA COAGULATION/BICAP);  Surgeon: [name], MD;  [facility];  Service: Endoscopy;;  gastric  . POLYPECTOMY  03/24/2016   Procedure: POLYPECTOMY;  Surgeon: [name], MD;  [facility];  Service: Endoscopy;;  sigmoid polyp  . RIGHT HEART CATH N/A 04/16/2017   Procedure: Right Heart Cath;  Surgeon: [name], MD;  Location: [place] [facility];  Service: Cardiovascular;  Laterality: N/A;  . RIGHT HEART CATH N/A 07/12/2017   Procedure: RIGHT HEART CATH;  Surgeon: [name], MD;  Location: [place] [facility];  Service: Cardiovascular;  Laterality: N/A;  . TONSILLECTOMY    . UPPER GASTROINTESTINAL ENDOSCOPY  03/15/2011   EGD ED BANDING/TCS  . UPPER GASTROINTESTINAL ENDOSCOPY  09/05/2010  . UPPER GASTROINTESTINAL ENDOSCOPY  08/11/2010  . VAGINAL HYSTERECTOMY      Family History  Problem Relation Age of Onset  . Lung cancer Mother   . Diabetes Father   . Diabetes Sister   . Hypertension Sister   . Hypothyroidism Sister   . Colon cancer Brother   . Diabetes Sister   .  Hypothyroidism Brother   . Healthy Daughter   . Obesity Daughter   . Hypertension Daughter    Social History:  reports that  has never smoked. she has never used smokeless tobacco. She reports that she does not drink alcohol or use drugs.  Allergies:  Allergies  Allergen Reactions  . Feraheme [Ferumoxytol] Other (See Comments)    Patient has severe back and chest pain when receiving FERAHEME infusions.  Marland Kitchen Ultram [Tramadol Hcl] Other (See Comments)    WEAKNESS    Medications Prior to Admission  Medication Sig Dispense Refill  . insulin aspart (NOVOLOG) 100 UNIT/ML FlexPen Use with given sliding scale  CBG  70-120: 0 units CBG 121-150: 1 unit CBG 151-200: 2 units CBG 201-250: 3 units CBG 251-300: 5 units CBG 301-350: 7 units CBG greater than 351: 9 units (Patient taking differently: Inject 3-10 Units into the skin 3 (three) times daily with meals. ) 15 mL 11  . Insulin Glargine (LANTUS SOLOSTAR) 100 UNIT/ML Solostar Pen Inject 38 Units daily at 10 pm into the skin. (Patient taking differently: Inject 42 Units into the skin daily at 10 pm. ) 15 mL 11  . levothyroxine (SYNTHROID, LEVOTHROID) 25 MCG tablet Take 25 mcg by mouth daily before breakfast.     . magnesium oxide (MAG-OX) 400 MG tablet Take 400 mg by mouth 2 (two) times a week.    . Multiple Vitamins-Iron (MULTIVITAMIN/IRON PO) Take 1 tablet by mouth daily.    . OXYGEN Inhale 2 L into the lungs at bedtime.     . pantoprazole (PROTONIX) 40 MG tablet Take 1 tablet by mouth once daily 90 tablet 3  . PARoxetine (PAXIL) 40 MG tablet Take 40 mg by mouth every morning.    . potassium chloride SA (K-DUR,KLOR-CON) 20 MEQ tablet Take 1.5 tablets (30 mEq total) daily by mouth. 45 tablet 3  . propranolol (INDERAL) 20 MG tablet Take 1 tablet (20 mg total) by mouth at bedtime as needed (for increased BP). 90 tablet 3  . rosuvastatin (CRESTOR) 5 MG tablet Take 1 tablet (5 mg total) by mouth every other day. 45 tablet 3  . spironolactone (ALDACTONE) 50 MG tablet Take 1 tablet (50 mg total) by mouth 2 (two) times daily. 60 tablet 5  . torsemide (DEMADEX) 20 MG tablet Take 40 mg (2 tabs) daily, alternating with 20 mg (1 tab) daily 45 tablet 6  . cholecalciferol (VITAMIN D) 1000 UNITS tablet Take 1,000 Units by mouth 3 (three) times a week.     . fluticasone furoate-vilanterol (BREO ELLIPTA) 200-25 MCG/INH AEPB Inhale 1 puff into the lungs daily. 1 each 3  . Insulin Pen Needle 31G X 5 MM MISC Use with each insulin injection 100 each 12  . lactulose (CHRONULAC) 10 GM/15ML solution Take 30 mLs (20 g total) 4 (four) times daily by mouth. (Patient taking  differently: Take 20 g by mouth 4 (four) times daily. Make take an additional 20 g up to two more times daily as needed for elevated ammonia levels) 240 mL 0    Results for orders placed or performed during the hospital encounter of 10/10/17 (from the past 48 hour(s))  Hemoglobin and hematocrit, blood     Status: Abnormal   Collection Time: 10/10/17  1:38 PM  Result Value Ref Range   Hemoglobin 9.1 (L) 12.0 - 15.0 g/dL   HCT 31.5 (L) 36.0 - 46.0 %  Glucose, capillary     Status: None   Collection Time: 10/10/17  2:02  PM  Result Value Ref Range   Glucose-Capillary 85 65 - 99 mg/dL  Glucose, capillary     Status: Abnormal   Collection Time: 10/10/17  2:23 PM  Result Value Ref Range   Glucose-Capillary 135 (H) 65 - 99 mg/dL   No results found.  ROS  Blood pressure (!) 118/47, pulse 77, temperature 99.2 F (37.3 C), temperature source Oral, resp. rate 18, height 5' 4"  (1.626 m), weight 217 lb (98.4 kg), SpO2 100 %. Physical Exam  Constitutional: She appears well-developed and well-nourished.  Eyes:  Conjunctiva is pale.  Sclerae nonicteric.  Neck: No thyromegaly present.  Cardiovascular: Normal rate and regular rhythm.  Murmur heard. Faint systolic murmur at left sternal border and aortic area.  Respiratory: Effort normal.  GI: Soft. She exhibits no distension. There is no tenderness.  Musculoskeletal: She exhibits edema.  1-2+ pitting edema both legs.  Lymphadenopathy:    She has no cervical adenopathy.  Neurological: She is alert.  Skin: Skin is warm and dry.     Assessment/Plan Gastric antral vascular ectasia with recurrent GI bleed. Cirrhosis secondary to NASH. Therapeutic EGD.  Hildred Laser, MD 10/10/2017, 3:07 PM

## 2017-10-10 NOTE — Op Note (Signed)
[facility] Patient Name: Maria [name] Procedure Date: 10/10/2017 11:55 AM MRN: [id number] Date of Birth: 20-Jan-1956 Attending MD: Hildred Laser , MD CSN: [id number] Age: 62 Admit Type: Outpatient Procedure:                Upper GI endoscopy Indications:              Therapeutic procedure, therapy for GAVE. Providers:                Hildred Laser, MD, Charlsie Quest Theda Sers RN, RN, Nelma Rothman, Technician Referring MD:             Glenda Chroman, MD Medicines:                Lidocaine spray, Meperidine 50 mg IV, Midazolam 10                            mg IV Complications:            No immediate complications. Estimated Blood Loss:     Estimated blood loss: none. Procedure:                Pre-Anesthesia Assessment:                           - Prior to the procedure, a History and Physical                            was performed, and patient medications and                            allergies were reviewed. The patient's tolerance of                            previous anesthesia was also reviewed. The risks                            and benefits of the procedure and the sedation                            options and risks were discussed with the patient.                            All questions were answered, and informed consent                            was obtained. Prior Anticoagulants: The patient has                            taken no previous anticoagulant or antiplatelet                            agents. ASA Grade Assessment: III - A patient with  severe systemic disease. After reviewing the risks                            and benefits, the patient was deemed in                            satisfactory condition to undergo the procedure.                           After obtaining informed consent, the endoscope was                            passed under direct vision. Throughout the   procedure, the patient's blood pressure, pulse, and                            oxygen saturations were monitored continuously. The                            EG-[id number] ([id number]) scope was introduced through the                            mouth, and advanced to the second part of duodenum.                            The upper GI endoscopy was accomplished without                            difficulty. The patient tolerated the procedure                            well. Scope In: 3:21:27 PM Scope Out: 3:33:40 PM Total Procedure Duration: 0 hours 12 minutes 13 seconds  Findings:      The proximal esophagus was normal.      Grade I varices were found in the middle third of the esophagus.      A post variceal banding scar was found in the distal esophagus.      The Z-line was regular and was found 37 cm from the incisors.      A 2 cm hiatal hernia was present.      Moderate portal hypertensive gastropathy was found in the gastric fundus       and in the gastric body.      Moderate gastric antral vascular ectasia was present in the gastric       antrum and in the prepyloric region of the stomach. Coagulation for       bleeding prevention using argon plasma was successful.      Localized moderately erythematous mucosa without active bleeding and       with no stigmata of bleeding was found in the duodenal bulb and in the       second portion of the duodenum. Impression:               - Normal proximal esophagus.                           - Grade  I esophageal varices.                           - Scars in the distal esophagus.                           - Z-line regular, 37 cm from the incisors.                           - 2 cm hiatal hernia.                           - Portal hypertensive gastropathy.                           - Gastric antral vascular ectasia. Treated with                            argon plasma coagulation (APC).                           - Erythematous duodenopathy(DAVE)                            - No specimens collected. Moderate Sedation:      Moderate (conscious) sedation was administered by the endoscopy nurse       and supervised by the endoscopist. The following parameters were       monitored: oxygen saturation, heart rate, blood pressure, CO2       capnography and response to care. Total physician intraservice time was       17 minutes. Recommendation:           - Patient has a contact number available for                            emergencies. The signs and symptoms of potential                            delayed complications were discussed with the                            patient. Return to normal activities tomorrow.                            Written discharge instructions were provided to the                            patient.                           - Full liquid diet today.                           - Resume previous diet for 1 day.                           - Continue present medications.                           -  Repeat upper endoscopy PRN. Procedure Code(s):        --- Professional ---                           [phone], Esophagogastroduodenoscopy, flexible,                            transoral; with control of bleeding, any method                           99152, Moderate sedation services provided by the                            same physician or other qualified health care                            professional performing the diagnostic or                            therapeutic service that the sedation supports,                            requiring the presence of an independent trained                            observer to assist in the monitoring of the                            patient's level of consciousness and physiological                            status; initial 15 minutes of intraservice time,                            patient age 52 years or older Diagnosis Code(s):        --- Professional ---                            I85.00, Esophageal varices without bleeding                           K22.8, Other specified diseases of esophagus                           K44.9, Diaphragmatic hernia without obstruction or                            gangrene                           K76.6, Portal hypertension                           K31.89, Other diseases of stomach and duodenum  K31.819, Angiodysplasia of stomach and duodenum                            without bleeding                           Q27.33, Arteriovenous malformation of digestive                            system vessel CPT copyright 2016 [employer]. All rights reserved. The codes documented in this report are preliminary and upon coder review may  be revised to meet current compliance requirements. Hildred Laser, MD Hildred Laser, MD 10/10/2017 3:45:24 PM This report has been signed electronically. Number of Addenda: 0

## 2017-10-10 NOTE — Discharge Instructions (Signed)
Resume usual medications as before. Full liquids today and usual diet starting tomorrow morning. No driving for 24 hours. H&H in 4 weeks.  Office will call.  Esophagogastroduodenoscopy, Care After Refer to this sheet in the next few weeks. These instructions provide you with information about caring for yourself after your procedure. Your health care provider may also give you more specific instructions. Your treatment has been planned according to current medical practices, but problems sometimes occur. Call your health care provider if you have any problems or questions after your procedure. Dr. Laural Golden:  [phone].  After hours call the hospital and have the GI doctor on call to return your call. What can I expect after the procedure? After the procedure, it is common to have:  A sore throat.  Nausea.  Bloating.  Dizziness.  Fatigue.  Follow these instructions at home:  Do not eat or drink anything until the numbing medicine (local anesthetic) has worn off and your gag reflex has returned. You will know that the local anesthetic has worn off when you can swallow comfortably.  Do not drive for 24 hours if you received a medicine to help you relax (sedative).  Keep all follow-up visits as told by your health care provider. This is important. Contact a health care provider if:  You cannot stop coughing.  You are not urinating.  You are urinating less than usual. Get help right away if:  You have trouble swallowing.  You cannot eat or drink.  You have throat or chest pain that gets worse.  You are dizzy or light-headed.  You faint.  You have nausea or vomiting that won't go away  You have chills.  You have a fever.  You have severe abdominal pain.  You have black, tarry, or bloody stools. This information is not intended to replace advice given to you by your health care provider. Make sure you discuss any questions you have with your health care  provider. Document Released: 08/21/2012 Document Revised: 02/10/2016 Document Reviewed: 07/29/2015 Elsevier Interactive Patient Education  [employer].

## 2017-10-15 ENCOUNTER — Encounter (HOSPITAL_COMMUNITY): Payer: Self-pay | Admitting: Internal Medicine

## 2017-10-19 ENCOUNTER — Ambulatory Visit (HOSPITAL_COMMUNITY)
Admission: RE | Admit: 2017-10-19 | Discharge: 2017-10-19 | Disposition: A | Discharge status: Home / Self Care | Payer: PPO | Source: Ambulatory Visit | Attending: Cardiology | Admitting: Cardiology

## 2017-10-19 DIAGNOSIS — I5032 Chronic diastolic (congestive) heart failure: Secondary | ICD-10-CM

## 2017-10-19 LAB — BASIC METABOLIC PANEL
Anion gap: 9 (ref 5–15)
BUN: 9 mg/dL (ref 6–20)
CO2: 23 mmol/L (ref 22–32)
Calcium: 8.6 mg/dL — ABNORMAL LOW (ref 8.9–10.3)
Chloride: 105 mmol/L (ref 101–111)
Creatinine, Ser: 0.84 mg/dL (ref 0.44–1.00)
GFR calc Af Amer: >60 mL/min (ref >60)
GFR calc non Af Amer: >60 mL/min (ref >60)
Glucose, Bld: 171 mg/dL — ABNORMAL HIGH (ref 65–99)
Potassium: 3.6 mmol/L (ref 3.5–5.1)
Sodium: 137 mmol/L (ref 135–145)

## 2017-10-23 DIAGNOSIS — E1165 Type 2 diabetes mellitus with hyperglycemia: Secondary | ICD-10-CM | POA: Diagnosis not present

## 2017-10-23 DIAGNOSIS — E039 Hypothyroidism, unspecified: Secondary | ICD-10-CM | POA: Diagnosis not present

## 2017-10-23 DIAGNOSIS — Z9641 Presence of insulin pump (external) (internal): Secondary | ICD-10-CM | POA: Diagnosis not present

## 2017-10-23 DIAGNOSIS — R945 Abnormal results of liver function studies: Secondary | ICD-10-CM | POA: Diagnosis not present

## 2017-10-23 DIAGNOSIS — E7800 Pure hypercholesterolemia, unspecified: Secondary | ICD-10-CM | POA: Diagnosis not present

## 2017-10-23 DIAGNOSIS — I10 Essential (primary) hypertension: Secondary | ICD-10-CM | POA: Diagnosis not present

## 2017-10-25 DIAGNOSIS — R0609 Other forms of dyspnea: Secondary | ICD-10-CM | POA: Diagnosis not present

## 2017-10-30 ENCOUNTER — Telehealth (INDEPENDENT_AMBULATORY_CARE_PROVIDER_SITE_OTHER): Payer: Self-pay | Admitting: *Deleted

## 2017-10-30 NOTE — Telephone Encounter (Signed)
Patient called to update [name] her weight is 201.5 patient states she does not have any edema, patient states she sleepy and her hands are very shaky, patient would like recommendations on her fluid medicines. Please advise [phone]

## 2017-10-31 ENCOUNTER — Encounter (INDEPENDENT_AMBULATORY_CARE_PROVIDER_SITE_OTHER): Payer: Self-pay | Admitting: *Deleted

## 2017-10-31 NOTE — Telephone Encounter (Signed)
Per Dr.Rehma the is not to take fluid medication for 2 days. Call with a progress report on Friday Morning. Patient was called and made aware.

## 2017-10-31 NOTE — Telephone Encounter (Signed)
Patient called and got voicemail , her mail box is full. Patient was sent a patient advice message.

## 2017-11-01 DIAGNOSIS — E119 Type 2 diabetes mellitus without complications: Secondary | ICD-10-CM | POA: Diagnosis not present

## 2017-11-01 DIAGNOSIS — H40013 Open angle with borderline findings, low risk, bilateral: Secondary | ICD-10-CM | POA: Diagnosis not present

## 2017-11-01 DIAGNOSIS — E113391 Type 2 diabetes mellitus with moderate nonproliferative diabetic retinopathy without macular edema, right eye: Secondary | ICD-10-CM | POA: Diagnosis not present

## 2017-11-01 DIAGNOSIS — H2513 Age-related nuclear cataract, bilateral: Secondary | ICD-10-CM | POA: Diagnosis not present

## 2017-11-01 DIAGNOSIS — E083312 Diabetes mellitus due to underlying condition with moderate nonproliferative diabetic retinopathy with macular edema, left eye: Secondary | ICD-10-CM | POA: Diagnosis not present

## 2017-11-06 ENCOUNTER — Encounter (HOSPITAL_COMMUNITY): Payer: PPO

## 2017-11-07 ENCOUNTER — Telehealth (HOSPITAL_COMMUNITY): Payer: Self-pay | Admitting: Cardiology

## 2017-11-07 ENCOUNTER — Inpatient Hospital Stay (HOSPITAL_COMMUNITY)
Admission: EM | Admit: 2017-11-07 | Discharge: 2017-11-09 | DRG: 378 | Disposition: A | Discharge status: Home / Self Care | Payer: PPO | Attending: Internal Medicine | Admitting: Internal Medicine

## 2017-11-07 ENCOUNTER — Encounter (HOSPITAL_COMMUNITY): Payer: Self-pay | Admitting: Emergency Medicine

## 2017-11-07 ENCOUNTER — Ambulatory Visit (HOSPITAL_BASED_OUTPATIENT_CLINIC_OR_DEPARTMENT_OTHER)
Admission: RE | Admit: 2017-11-07 | Discharge: 2017-11-07 | Disposition: A | Discharge status: Home / Self Care | Payer: PPO | Source: Ambulatory Visit | Attending: Internal Medicine | Admitting: Internal Medicine

## 2017-11-07 ENCOUNTER — Other Ambulatory Visit: Payer: Self-pay

## 2017-11-07 ENCOUNTER — Telehealth (INDEPENDENT_AMBULATORY_CARE_PROVIDER_SITE_OTHER): Payer: Self-pay | Admitting: *Deleted

## 2017-11-07 ENCOUNTER — Encounter (HOSPITAL_COMMUNITY): Payer: Self-pay

## 2017-11-07 VITALS — BP 122/54 | HR 78 | Wt 203.0 lb

## 2017-11-07 DIAGNOSIS — IMO0001 Reserved for inherently not codable concepts without codable children: Secondary | ICD-10-CM

## 2017-11-07 DIAGNOSIS — Z800 Family history of malignant neoplasm of digestive organs: Secondary | ICD-10-CM | POA: Diagnosis not present

## 2017-11-07 DIAGNOSIS — K449 Diaphragmatic hernia without obstruction or gangrene: Secondary | ICD-10-CM | POA: Diagnosis not present

## 2017-11-07 DIAGNOSIS — I110 Hypertensive heart disease with heart failure: Secondary | ICD-10-CM | POA: Diagnosis present

## 2017-11-07 DIAGNOSIS — I2510 Atherosclerotic heart disease of native coronary artery without angina pectoris: Secondary | ICD-10-CM

## 2017-11-07 DIAGNOSIS — Z833 Family history of diabetes mellitus: Secondary | ICD-10-CM | POA: Diagnosis not present

## 2017-11-07 DIAGNOSIS — R188 Other ascites: Secondary | ICD-10-CM | POA: Diagnosis present

## 2017-11-07 DIAGNOSIS — D509 Iron deficiency anemia, unspecified: Secondary | ICD-10-CM | POA: Diagnosis not present

## 2017-11-07 DIAGNOSIS — Z7951 Long term (current) use of inhaled steroids: Secondary | ICD-10-CM | POA: Diagnosis not present

## 2017-11-07 DIAGNOSIS — D696 Thrombocytopenia, unspecified: Secondary | ICD-10-CM

## 2017-11-07 DIAGNOSIS — M797 Fibromyalgia: Secondary | ICD-10-CM | POA: Diagnosis present

## 2017-11-07 DIAGNOSIS — I8500 Esophageal varices without bleeding: Secondary | ICD-10-CM | POA: Diagnosis not present

## 2017-11-07 DIAGNOSIS — K31819 Angiodysplasia of stomach and duodenum without bleeding: Secondary | ICD-10-CM | POA: Diagnosis present

## 2017-11-07 DIAGNOSIS — I272 Pulmonary hypertension, unspecified: Secondary | ICD-10-CM | POA: Diagnosis not present

## 2017-11-07 DIAGNOSIS — E785 Hyperlipidemia, unspecified: Secondary | ICD-10-CM | POA: Diagnosis not present

## 2017-11-07 DIAGNOSIS — K228 Other specified diseases of esophagus: Secondary | ICD-10-CM | POA: Diagnosis not present

## 2017-11-07 DIAGNOSIS — Z794 Long term (current) use of insulin: Secondary | ICD-10-CM

## 2017-11-07 DIAGNOSIS — D649 Anemia, unspecified: Secondary | ICD-10-CM | POA: Diagnosis not present

## 2017-11-07 DIAGNOSIS — E119 Type 2 diabetes mellitus without complications: Secondary | ICD-10-CM | POA: Diagnosis present

## 2017-11-07 DIAGNOSIS — K7581 Nonalcoholic steatohepatitis (NASH): Secondary | ICD-10-CM | POA: Diagnosis not present

## 2017-11-07 DIAGNOSIS — F329 Major depressive disorder, single episode, unspecified: Secondary | ICD-10-CM | POA: Diagnosis present

## 2017-11-07 DIAGNOSIS — K922 Gastrointestinal hemorrhage, unspecified: Secondary | ICD-10-CM | POA: Diagnosis not present

## 2017-11-07 DIAGNOSIS — Z79899 Other long term (current) drug therapy: Secondary | ICD-10-CM | POA: Diagnosis not present

## 2017-11-07 DIAGNOSIS — R5383 Other fatigue: Secondary | ICD-10-CM | POA: Diagnosis not present

## 2017-11-07 DIAGNOSIS — R58 Hemorrhage, not elsewhere classified: Secondary | ICD-10-CM | POA: Diagnosis not present

## 2017-11-07 DIAGNOSIS — K31811 Angiodysplasia of stomach and duodenum with bleeding: Secondary | ICD-10-CM | POA: Diagnosis not present

## 2017-11-07 DIAGNOSIS — D6959 Other secondary thrombocytopenia: Secondary | ICD-10-CM | POA: Diagnosis not present

## 2017-11-07 DIAGNOSIS — K766 Portal hypertension: Secondary | ICD-10-CM | POA: Diagnosis not present

## 2017-11-07 DIAGNOSIS — E039 Hypothyroidism, unspecified: Secondary | ICD-10-CM | POA: Diagnosis present

## 2017-11-07 DIAGNOSIS — M810 Age-related osteoporosis without current pathological fracture: Secondary | ICD-10-CM | POA: Diagnosis present

## 2017-11-07 DIAGNOSIS — E7800 Pure hypercholesterolemia, unspecified: Secondary | ICD-10-CM | POA: Diagnosis not present

## 2017-11-07 DIAGNOSIS — Z79890 Hormone replacement therapy (postmenopausal): Secondary | ICD-10-CM | POA: Diagnosis not present

## 2017-11-07 DIAGNOSIS — D62 Acute posthemorrhagic anemia: Secondary | ICD-10-CM | POA: Diagnosis not present

## 2017-11-07 DIAGNOSIS — K3189 Other diseases of stomach and duodenum: Secondary | ICD-10-CM | POA: Diagnosis not present

## 2017-11-07 DIAGNOSIS — I5032 Chronic diastolic (congestive) heart failure: Secondary | ICD-10-CM | POA: Diagnosis present

## 2017-11-07 DIAGNOSIS — K7460 Unspecified cirrhosis of liver: Secondary | ICD-10-CM

## 2017-11-07 DIAGNOSIS — F419 Anxiety disorder, unspecified: Secondary | ICD-10-CM | POA: Diagnosis present

## 2017-11-07 LAB — BASIC METABOLIC PANEL
Anion gap: 7 (ref 5–15)
BUN: 7 mg/dL (ref 6–20)
CO2: 22 mmol/L (ref 22–32)
Calcium: 8.3 mg/dL — ABNORMAL LOW (ref 8.9–10.3)
Chloride: 107 mmol/L (ref 101–111)
Creatinine, Ser: 0.81 mg/dL (ref 0.44–1.00)
GFR calc Af Amer: >60 mL/min (ref >60)
GFR calc non Af Amer: >60 mL/min (ref >60)
Glucose, Bld: 246 mg/dL — ABNORMAL HIGH (ref 65–99)
Potassium: 4.3 mmol/L (ref 3.5–5.1)
Sodium: 136 mmol/L (ref 135–145)

## 2017-11-07 LAB — CBC WITH DIFFERENTIAL/PLATELET
Basophils Absolute: 0.1 10*3/uL (ref 0.0–0.1)
Basophils Relative: 1 %
Eosinophils Absolute: 0.1 10*3/uL (ref 0.0–0.7)
Eosinophils Relative: 2 %
HCT: 24 % — ABNORMAL LOW (ref 36.0–46.0)
Hemoglobin: 6.8 g/dL — CL (ref 12.0–15.0)
Lymphocytes Relative: 21 %
Lymphs Abs: 1.1 10*3/uL (ref 0.7–4.0)
MCH: 24.5 pg — ABNORMAL LOW (ref 26.0–34.0)
MCHC: 28.3 g/dL — ABNORMAL LOW (ref 30.0–36.0)
MCV: 86.3 fL (ref 78.0–100.0)
Monocytes Absolute: 0.5 10*3/uL (ref 0.1–1.0)
Monocytes Relative: 11 %
Neutro Abs: 3.2 10*3/uL (ref 1.7–7.7)
Neutrophils Relative %: 65 %
Platelets: 44 10*3/uL — ABNORMAL LOW (ref 150–400)
RBC: 2.78 MIL/uL — ABNORMAL LOW (ref 3.87–5.11)
RDW: 21.5 % — ABNORMAL HIGH (ref 11.5–15.5)
WBC: 4.9 10*3/uL (ref 4.0–10.5)

## 2017-11-07 LAB — COMPREHENSIVE METABOLIC PANEL
ALT: 23 U/L (ref 14–54)
AST: 33 U/L (ref 15–41)
Albumin: 2.5 g/dL — ABNORMAL LOW (ref 3.5–5.0)
Alkaline Phosphatase: 102 U/L (ref 38–126)
Anion gap: 6 (ref 5–15)
BUN: 11 mg/dL (ref 6–20)
CO2: 21 mmol/L — ABNORMAL LOW (ref 22–32)
Calcium: 8.3 mg/dL — ABNORMAL LOW (ref 8.9–10.3)
Chloride: 107 mmol/L (ref 101–111)
Creatinine, Ser: 0.76 mg/dL (ref 0.44–1.00)
GFR calc Af Amer: >60 mL/min (ref >60)
GFR calc non Af Amer: >60 mL/min (ref >60)
Glucose, Bld: 250 mg/dL — ABNORMAL HIGH (ref 65–99)
Potassium: 4.1 mmol/L (ref 3.5–5.1)
Sodium: 134 mmol/L — ABNORMAL LOW (ref 135–145)
Total Bilirubin: 1.9 mg/dL — ABNORMAL HIGH (ref 0.3–1.2)
Total Protein: 6.3 g/dL — ABNORMAL LOW (ref 6.5–8.1)

## 2017-11-07 LAB — IRON AND TIBC
Iron: 29 ug/dL (ref 28–170)
Saturation Ratios: 7 % — ABNORMAL LOW (ref 10.4–31.8)
TIBC: 389 ug/dL (ref 250–450)
UIBC: 360 ug/dL

## 2017-11-07 LAB — AMMONIA: Ammonia: 22 umol/L (ref 9–35)

## 2017-11-07 LAB — VITAMIN B12: Vitamin B-12: 619 pg/mL (ref 180–914)

## 2017-11-07 LAB — FOLATE: Folate: 10.8 ng/mL (ref >5.9)

## 2017-11-07 LAB — FERRITIN: Ferritin: 5 ng/mL — ABNORMAL LOW (ref 11–307)

## 2017-11-07 LAB — POC OCCULT BLOOD, ED: Fecal Occult Bld: POSITIVE — AB

## 2017-11-07 LAB — PREPARE RBC (CROSSMATCH)

## 2017-11-07 MED ORDER — SODIUM CHLORIDE 0.9 % IV SOLN
Freq: Once | INTRAVENOUS | Status: AC
  Administered 2017-11-08: 01:00:00 via INTRAVENOUS

## 2017-11-07 MED ORDER — ROSUVASTATIN CALCIUM 10 MG PO TABS
5.0000 mg | ORAL_TABLET | ORAL | Status: DC
  Administered 2017-11-08: 5 mg via ORAL
  Filled 2017-11-07 (×3): qty 1

## 2017-11-07 MED ORDER — SODIUM CHLORIDE 0.9% FLUSH
3.0000 mL | Freq: Two times a day (BID) | INTRAVENOUS | Status: DC
  Administered 2017-11-08 – 2017-11-09 (×2): 3 mL via INTRAVENOUS

## 2017-11-07 MED ORDER — PAROXETINE HCL 20 MG PO TABS
40.0000 mg | ORAL_TABLET | Freq: Every day | ORAL | Status: DC
  Administered 2017-11-08 – 2017-11-09 (×2): 40 mg via ORAL
  Filled 2017-11-07 (×2): qty 2

## 2017-11-07 MED ORDER — ACETAMINOPHEN 325 MG PO TABS: 650.0000 mg | ORAL_TABLET | Freq: Four times a day (QID) | ORAL | Status: DC | PRN

## 2017-11-07 MED ORDER — INSULIN ASPART 100 UNIT/ML ~~LOC~~ SOLN
0.0000 [IU] | SUBCUTANEOUS | Status: DC
  Administered 2017-11-08: 1 [IU] via SUBCUTANEOUS
  Administered 2017-11-08 – 2017-11-09 (×2): 3 [IU] via SUBCUTANEOUS
  Administered 2017-11-09: 1 [IU] via SUBCUTANEOUS
  Administered 2017-11-09: 2 [IU] via SUBCUTANEOUS

## 2017-11-07 MED ORDER — TORSEMIDE 20 MG PO TABS: 20.0000 mg | ORAL_TABLET | Freq: Every day | ORAL | 11 refills | Status: DC

## 2017-11-07 MED ORDER — VITAMIN D 1000 UNITS PO TABS
1000.0000 [IU] | ORAL_TABLET | ORAL | Status: DC
  Administered 2017-11-09: 1000 [IU] via ORAL
  Filled 2017-11-07: qty 1

## 2017-11-07 MED ORDER — PANTOPRAZOLE SODIUM 40 MG PO TBEC
40.0000 mg | DELAYED_RELEASE_TABLET | Freq: Every day | ORAL | Status: DC
  Administered 2017-11-08 – 2017-11-09 (×2): 40 mg via ORAL
  Filled 2017-11-07 (×2): qty 1

## 2017-11-07 MED ORDER — INSULIN GLARGINE 100 UNIT/ML ~~LOC~~ SOLN
30.0000 [IU] | Freq: Every day | SUBCUTANEOUS | Status: DC
  Administered 2017-11-08: 30 [IU] via SUBCUTANEOUS
  Filled 2017-11-07 (×2): qty 0.3

## 2017-11-07 MED ORDER — HYDROCODONE-ACETAMINOPHEN 5-325 MG PO TABS
1.0000 | ORAL_TABLET | ORAL | Status: DC | PRN
  Administered 2017-11-08: 1 via ORAL
  Filled 2017-11-07: qty 1

## 2017-11-07 MED ORDER — LACTULOSE 10 GM/15ML PO SOLN: 30.0000 g | Freq: Two times a day (BID) | ORAL | Status: DC | PRN

## 2017-11-07 MED ORDER — SODIUM CHLORIDE 0.9% FLUSH: 3.0000 mL | Freq: Two times a day (BID) | INTRAVENOUS | Status: DC

## 2017-11-07 MED ORDER — ONDANSETRON HCL 4 MG PO TABS: 4.0000 mg | ORAL_TABLET | Freq: Four times a day (QID) | ORAL | Status: DC | PRN

## 2017-11-07 MED ORDER — ONDANSETRON HCL 4 MG/2ML IJ SOLN: 4.0000 mg | Freq: Four times a day (QID) | INTRAMUSCULAR | Status: DC | PRN

## 2017-11-07 MED ORDER — SODIUM CHLORIDE 0.9% FLUSH: 3.0000 mL | INTRAVENOUS | Status: DC | PRN

## 2017-11-07 MED ORDER — ACETAMINOPHEN 650 MG RE SUPP: 650.0000 mg | Freq: Four times a day (QID) | RECTAL | Status: DC | PRN

## 2017-11-07 MED ORDER — LEVOTHYROXINE SODIUM 25 MCG PO TABS
25.0000 ug | ORAL_TABLET | Freq: Every day | ORAL | Status: DC
  Administered 2017-11-08 – 2017-11-09 (×2): 25 ug via ORAL
  Filled 2017-11-07 (×2): qty 1

## 2017-11-07 MED ORDER — SODIUM CHLORIDE 0.9 % IV SOLN: 250.0000 mL | INTRAVENOUS | Status: DC | PRN

## 2017-11-07 MED ORDER — FLUTICASONE FUROATE-VILANTEROL 200-25 MCG/INH IN AEPB
1.0000 | INHALATION_SPRAY | Freq: Every day | RESPIRATORY_TRACT | Status: DC
  Administered 2017-11-08 – 2017-11-09 (×2): 1 via RESPIRATORY_TRACT
  Filled 2017-11-07: qty 28

## 2017-11-07 NOTE — ED Notes (Addendum)
Occult blood card positive  Stool appearance was brown

## 2017-11-07 NOTE — Telephone Encounter (Signed)
Patient called with a progress report. Her weight on 11/06/2017 was 198.3 lbs , on 11/05/2017 she weighed 200.1 lbs. She is taking 1/2 the dosage of the fluid medications as she was advised to do.  This was addressed with Dr.Rehman and he ask that the patient stop the fluid medications and call with a progress report on Friday Morning.  Patient was called and made aware.

## 2017-11-07 NOTE — Telephone Encounter (Signed)
Pt aware and voiced understanding 

## 2017-11-07 NOTE — Patient Instructions (Signed)
CHANGE Torsemide to 20 mg once daily.  Routine lab work today. Will notify you of abnormal results, otherwise no news is good news!  Follow up with Dr. Aundra Dubin in 6 weeks.  ________________________________________________________ Maria [name] Code:  Take all medication as prescribed the day of your appointment. Bring all medications with you to your appointment.  Do the following things EVERYDAY: 1) Weigh yourself in the morning before breakfast. Write it down and keep it in a log. 2) Take your medicines as prescribed 3) Eat low salt foods-Limit salt (sodium) to 2000 mg per day.  4) Stay as active as you can everyday 5) Limit all fluids for the day to less than 2 liters

## 2017-11-07 NOTE — H&P (Signed)
History and Physical    Maria [name] HGD:[id number] DOB: 05/10/1956 DOA: 11/07/2017  PCP: Glenda Chroman, MD   Patient coming from: Home  Chief Complaint: Fatigue, DOE, dark stool   HPI: Maria [name] is a 62 y.o. female with medical history significant for nonalcoholic cirrhosis with varices, insulin-dependent diabetes mellitus, and coronary artery disease, now presenting to the emergency department with generalized weakness, fatigue, exertional dyspnea, and some dark stools last week.  Patient reports that she had been in her usual state of health until the insidious development, mild exertional dyspnea, and generalized weakness over the past few days.  She reports that her stools have been dark last week.  She saw her cardiologist today in the clinic, blood work was collected, and she was later called, told to go to the ER for evaluation of anemia.  Denies lightheadedness or chest pain.  No fevers or chills.  She has history of variceal bleeding, more recent history of UGIB attributed to GAVE and treated with APC.  ED Course: Upon arrival to the ED, patient is found to be afebrile, saturating well on room air, and with blood pressure 105/37.  CBC is notable for hemoglobin of 6.8, down from 8-9 last month, and chronic thrombocytopenia with platelets now 44,000.  Chemistry panel is notable for glucose of 250.  DRE reveals brown stool that is FOBT positive.  Gastroenterology was consulted by the ED physician and recommended medical admission with clear liquids after midnight, indicating that there is no role for octreotide or Protonix in this situation.  A unit of packed red blood cells was ordered, patient remained hemodynamically stable, in no apparent respiratory distress, and will be admitted to the telemetry unit for ongoing evaluation and management of symptomatic anemia with occult GI bleeding likely secondary to GAVE.  Review of Systems:  All other systems reviewed and apart from HPI,  are negative.  Past Medical History:  Diagnosis Date  . Anemia   . CHF (congestive heart failure) ([place])   . Cirrhosis ([place])   . Depression   . Diabetes mellitus   . Esophageal bleed, non-variceal   . Eye hemorrhage    Behind left eye  . Fibromyalgia   . Hypercholesteremia   . Hypertension   . Hypothyroidism   . NAFLD (nonalcoholic fatty liver disease)   . Osteoarthrosis   . Osteoporosis   . PONV (postoperative nausea and vomiting)   . UTI (urinary tract infection) 11/12 end of the month   Patient feels that she passed a kidney stone at that time    Past Surgical History:  Procedure Laterality Date  . APPENDECTOMY  1980  . BILATERAL SALPINGOOPHORECTOMY    . CHOLECYSTECTOMY    . COLONOSCOPY  03/15/2011  . COLONOSCOPY N/A 03/24/2016   Procedure: COLONOSCOPY;  Surgeon: [name], MD;  [facility];  Service: Endoscopy;  Laterality: N/A;  855  . ESOPHAGEAL BANDING  04/24/2012   Procedure: ESOPHAGEAL BANDING;  Surgeon: [name], MD;  [facility];  Service: Endoscopy;  Laterality: N/A;  . Esophageal BANDING  08/03/2012   [facility] in [place] , [place]  09/24/2012   Procedure: ESOPHAGEAL BANDING;  Surgeon: [name], MD;  [facility]: [facility];  Service: Endoscopy;  Laterality: N/A;  Banding x 3  . ESOPHAGEAL BANDING N/A 01/29/2013   Procedure: ESOPHAGEAL BANDING;  Surgeon: [name], MD;  [facility];  Service: Endoscopy;  Laterality:  N/A;  . ESOPHAGEAL BANDING N/A 06/19/2014   Procedure: ESOPHAGEAL BANDING;  Surgeon: [name], MD;  [facility];  Service: Endoscopy;  Laterality: N/A;  . ESOPHAGEAL BANDING N/A 01/05/2016   Procedure: ESOPHAGEAL BANDING;  Surgeon: [name], MD;  [facility];  Service: Endoscopy;  Laterality: N/A;  . ESOPHAGEAL BANDING N/A 08/03/2016   Procedure: ESOPHAGEAL BANDING;  Surgeon: [name], MD;  [facility];  Service:  Endoscopy;  Laterality: N/A;  . ESOPHAGEAL BANDING N/A 05/02/2017   Procedure: ESOPHAGEAL BANDING;  Surgeon: [name], MD;  [facility];  Service: Endoscopy;  Laterality: N/A;  . ESOPHAGOGASTRODUODENOSCOPY  04/24/2012   Procedure: ESOPHAGOGASTRODUODENOSCOPY (EGD);  Surgeon: [name], MD;  [facility];  Service: Endoscopy;  Laterality: N/A;  300  . ESOPHAGOGASTRODUODENOSCOPY N/A 01/29/2013   Procedure: ESOPHAGOGASTRODUODENOSCOPY (EGD);  Surgeon: [name], MD;  [facility];  Service: Endoscopy;  Laterality: N/A;  1200  . ESOPHAGOGASTRODUODENOSCOPY N/A 05/22/2013   Procedure: ESOPHAGOGASTRODUODENOSCOPY (EGD);  Surgeon: [name], MD;  [facility];  Service: Endoscopy;  Laterality: N/A;  1:55  . ESOPHAGOGASTRODUODENOSCOPY N/A 12/31/2013   Procedure: ESOPHAGOGASTRODUODENOSCOPY (EGD);  Surgeon: [name], MD;  [facility];  Service: Endoscopy;  Laterality: N/A;  1200  . ESOPHAGOGASTRODUODENOSCOPY N/A 06/19/2014   Procedure: ESOPHAGOGASTRODUODENOSCOPY (EGD);  Surgeon: [name], MD;  [facility];  Service: Endoscopy;  Laterality: N/A;  1055  . ESOPHAGOGASTRODUODENOSCOPY N/A 01/15/2015   Procedure: ESOPHAGOGASTRODUODENOSCOPY (EGD);  Surgeon: [name], MD;  [facility];  Service: Endoscopy;  Laterality: N/A;  730 - moved to 9:45 - moved to 1250-Ann notified pt  . ESOPHAGOGASTRODUODENOSCOPY N/A 06/30/2015   Procedure: ESOPHAGOGASTRODUODENOSCOPY (EGD);  Surgeon: [name], MD;  [facility];  Service: Endoscopy;  Laterality: N/A;  1200  . ESOPHAGOGASTRODUODENOSCOPY N/A 01/05/2016   Procedure: ESOPHAGOGASTRODUODENOSCOPY (EGD);  Surgeon: [name], MD;  [facility];  Service: Endoscopy;  Laterality: N/A;  830  . ESOPHAGOGASTRODUODENOSCOPY N/A 08/03/2016   Procedure: ESOPHAGOGASTRODUODENOSCOPY (EGD);  Surgeon: [name], MD;  [facility];   Service: Endoscopy;  Laterality: N/A;  930  . ESOPHAGOGASTRODUODENOSCOPY N/A 05/02/2017   Procedure: ESOPHAGOGASTRODUODENOSCOPY (EGD);  Surgeon: [name], MD;  [facility];  Service: Endoscopy;  Laterality: N/A;  12:00  . ESOPHAGOGASTRODUODENOSCOPY N/A 08/17/2017   Procedure: ESOPHAGOGASTRODUODENOSCOPY (EGD);  Surgeon: [name], MD;  [facility];  Service: Endoscopy;  Laterality: N/A;  8:15  . ESOPHAGOGASTRODUODENOSCOPY N/A 09/12/2017   Procedure: ESOPHAGOGASTRODUODENOSCOPY (EGD);  Surgeon: [name], MD;  [facility];  Service: Endoscopy;  Laterality: N/A;  1040  . ESOPHAGOGASTRODUODENOSCOPY N/A 10/10/2017   Procedure: ESOPHAGOGASTRODUODENOSCOPY (EGD);  Surgeon: [name], MD;  [facility];  Service: Endoscopy;  Laterality: N/A;  225  . ESOPHAGOGASTRODUODENOSCOPY (EGD) WITH PROPOFOL  09/24/2012   Procedure: ESOPHAGOGASTRODUODENOSCOPY (EGD) WITH PROPOFOL;  Surgeon: [name], MD;  [facility];  Service: Endoscopy;  Laterality: N/A;  GE junction at 36  . ESOPHAGOGASTRODUODENOSCOPY (EGD) WITH PROPOFOL N/A 07/06/2017   Procedure: ESOPHAGOGASTRODUODENOSCOPY (EGD) WITH PROPOFOL;  Surgeon: [name], MD;  [facility];  Service: Endoscopy;  Laterality: N/A;  . ESOPHAGOGASTRODUODENOSCOPY W/ BANDING  08/2010  . GIVENS CAPSULE STUDY N/A 07/05/2017   Procedure: GIVENS CAPSULE STUDY;  Surgeon: [name], MD;  [facility];  Service: Endoscopy;  Laterality: N/A;  . HOT HEMOSTASIS N/A 08/17/2017   Procedure: HOT HEMOSTASIS (ARGON PLASMA COAGULATION/BICAP);  Surgeon: [name], MD;  [facility];  Service: Endoscopy;  Laterality: N/A;  . HOT HEMOSTASIS  09/12/2017   Procedure: HOT HEMOSTASIS (ARGON PLASMA COAGULATION/BICAP);  Surgeon: [name], MD;  [facility];  Service: Endoscopy;;  gastric  . HOT HEMOSTASIS  10/10/2017   Procedure: HOT HEMOSTASIS  (ARGON PLASMA COAGULATION/BICAP);  Surgeon: [name], MD;  [facility];  Service: Endoscopy;;  . POLYPECTOMY  03/24/2016   Procedure: POLYPECTOMY;  Surgeon: [name], MD;  [facility];  Service: Endoscopy;;  sigmoid polyp  . RIGHT HEART CATH N/A 04/16/2017   Procedure: Right Heart Cath;  Surgeon: [name], MD;  Location: [place] [facility];  Service: Cardiovascular;  Laterality: N/A;  . RIGHT HEART CATH N/A 07/12/2017   Procedure: RIGHT HEART CATH;  Surgeon: [name], MD;  Location: [place] [facility];  Service: Cardiovascular;  Laterality: N/A;  . TONSILLECTOMY    . UPPER GASTROINTESTINAL ENDOSCOPY  03/15/2011   [facility] ED BANDING/TCS  . UPPER GASTROINTESTINAL ENDOSCOPY  09/05/2010  . UPPER GASTROINTESTINAL ENDOSCOPY  08/11/2010  . VAGINAL HYSTERECTOMY       reports that  has never smoked. she has never used smokeless tobacco. She reports that she does not drink alcohol or use drugs.  Allergies  Allergen Reactions  . Feraheme [Ferumoxytol] Other (See Comments)    Patient has severe back and chest pain when receiving FERAHEME infusions.  Marland Kitchen Ultram [Tramadol Hcl] Other (See Comments)    WEAKNESS    Family History  Problem Relation Age of Onset  . Lung cancer Mother   . Diabetes Father   . Diabetes Sister   . Hypertension Sister   . Hypothyroidism Sister   . Colon cancer Brother   . Diabetes Sister   . Hypothyroidism Brother   . Healthy Daughter   . Obesity Daughter   . Hypertension Daughter      Prior to Admission medications   Medication Sig Start Date End Date Taking? Authorizing Provider  cholecalciferol (VITAMIN D) 1000 UNITS tablet Take 1,000 Units by mouth 3 (three) times a week.    Yes [provider]  fluticasone furoate-vilanterol (BREO ELLIPTA) 200-25 MCG/INH AEPB Inhale 1 puff into the lungs daily. 02/22/17  Yes Magdalen Spatz, NP  insulin aspart (NOVOLOG) 100 UNIT/ML FlexPen Use with given sliding  scale  CBG 70-120: 0 units CBG 121-150: 1 unit CBG 151-200: 2 units CBG 201-250: 3 units CBG 251-300: 5 units CBG 301-350: 7 units CBG greater than 351: 9 units Patient taking differently: Inject 1-10 Units into the skin 3 (three) times daily with meals. Use with given sliding scale as directed with meals 07/22/17  Yes [name], [name], MD  Insulin Glargine (LANTUS SOLOSTAR) 100 UNIT/ML Solostar Pen Inject 38 Units daily at 10 pm into the skin. Patient taking differently: Inject 44 Units into the skin every morning.  07/22/17  Yes [name], [name], MD  lactulose (CHRONULAC) 10 GM/15ML solution Take 30 mLs (20 g total) 4 (four) times daily by mouth. Patient taking differently: Take 30 g by mouth 2 (two) times daily as needed. This dose to insure three bowel movements daily-may be adjusted accordingly 07/22/17  Yes [name], [name], MD  levothyroxine (SYNTHROID, LEVOTHROID) 25 MCG tablet Take 25 mcg by mouth daily before  breakfast.    Yes [provider]  magnesium oxide (MAG-OX) 400 MG tablet Take 400 mg by mouth daily.    Yes [provider]  OXYGEN Inhale 2 L into the lungs daily as needed.    Yes [provider]  pantoprazole (PROTONIX) 40 MG tablet Take 1 tablet by mouth once daily 02/06/17  Yes Rehman, Najeeb U, MD  PARoxetine (PAXIL) 40 MG tablet Take 40 mg by mouth every morning.   Yes [provider]  Pediatric Multivitamins-Iron (FLINTSTONES PLUS IRON PO) Take 2 tablets by mouth every evening.   Yes [provider]  potassium chloride SA (K-DUR,KLOR-CON) 20 MEQ tablet Take 1.5 tablets (30 mEq total) daily by mouth. 07/27/17  Yes [name], MD  propranolol (INDERAL) 20 MG tablet Take 1 tablet (20 mg total) by mouth at bedtime as needed (for increased BP). Patient taking differently: Take 20 mg by mouth daily. *May take one additional tablet as needed for high blood pressure levels* 10/05/17  Yes Rehman, Mechele Dawley,  MD  rosuvastatin (CRESTOR) 5 MG tablet Take 1 tablet (5 mg total) by mouth every other day. 06/27/17 10/09/18 Yes [name], MD  spironolactone (ALDACTONE) 50 MG tablet Take 50 mg by mouth 2 (two) times daily.    Yes [provider]  torsemide (DEMADEX) 20 MG tablet Take 1 tablet (20 mg total) by mouth daily. 11/07/17  Yes Shirley Friar, PA-C  Insulin Pen Needle 31G X 5 MM MISC Use with each insulin injection 07/22/17   [name], [name], MD    Physical Exam: Vitals:   11/07/17 2040 11/07/17 2100 11/07/17 2130 11/07/17 2200  BP:  (!) 100/21 (!) 105/32 94/64  Pulse:  70 69 71  Resp:      Temp: 98 F (36.7 C)     TempSrc: Oral     SpO2:  98% 98% 98%  Weight:          Constitutional: NAD, calm. Pale  Eyes: PERTLA, lids and conjunctivae normal ENMT: Mucous membranes are moist. Posterior pharynx clear of any exudate or lesions.   Neck: normal, supple, no masses, no thyromegaly Respiratory: clear to auscultation bilaterally, no wheezing, no crackles. Normal respiratory effort.   Cardiovascular: S1 & S2 heard, regular rate and rhythm. Trace pretibial edema bilaterally. Abdomen: No distension, no tenderness, soft. Bowel sounds normal.  Musculoskeletal: no clubbing / cyanosis. No joint deformity upper and lower extremities.   Skin: Pale. Warm, dry, well-perfused. Neurologic: CN 2-12 grossly intact. Sensation intact. Strength 5/5 in all 4 limbs.  Psychiatric: Alert and oriented x 3. Calm, cooperative.     Labs on Admission: I have personally reviewed following labs and imaging studies  CBC: Recent Labs  Lab 11/07/17 1421 11/07/17 2107  WBC 5.2 4.9  NEUTROABS  --  3.2  HGB 6.7* 6.8*  HCT 23.3* 24.0*  MCV 84.7 86.3  PLT PLATELET CLUMPS NOTED ON SMEAR, UNABLE TO ESTIMATE 44*   Basic Metabolic Panel: Recent Labs  Lab 11/07/17 1421 11/07/17 2107  NA 136 134*  K 4.3 4.1  CL 107 107  CO2 22 21*  GLUCOSE 246* 250*  BUN 7 11  CREATININE 0.81  0.76  CALCIUM 8.3* 8.3*   GFR: Estimated Creatinine Clearance: 80.2 mL/min (by C-G formula based on SCr of 0.76 mg/dL). Liver Function Tests: Recent Labs  Lab 11/07/17 2107  AST 33  ALT 23  ALKPHOS 102  BILITOT 1.9*  PROT 6.3*  ALBUMIN 2.5*   No  results for input(s): LIPASE, AMYLASE in the last 168 hours. Recent Labs  Lab 11/07/17 1421  AMMONIA 22   Coagulation Profile: No results for input(s): INR, PROTIME in the last 168 hours. Cardiac Enzymes: No results for input(s): CKTOTAL, CKMB, CKMBINDEX, TROPONINI in the last 168 hours. BNP (last 3 results) No results for input(s): PROBNP in the last 8760 hours. HbA1C: No results for input(s): HGBA1C in the last 72 hours. CBG: No results for input(s): GLUCAP in the last 168 hours. Lipid Profile: No results for input(s): CHOL, HDL, LDLCALC, TRIG, CHOLHDL, LDLDIRECT in the last 72 hours. Thyroid Function Tests: No results for input(s): TSH, T4TOTAL, FREET4, T3FREE, THYROIDAB in the last 72 hours. Anemia Panel: Recent Labs    11/07/17 1421  VITAMINB12 619  FOLATE 10.8  FERRITIN 5*  TIBC 389  IRON 29   Urine analysis:    Component Value Date/Time   COLORURINE YELLOW 07/18/2017 0300   APPEARANCEUR CLEAR 07/18/2017 0300   LABSPEC 1.016 07/18/2017 0300   PHURINE 5.0 07/18/2017 0300   GLUCOSEU NEGATIVE 07/18/2017 0300   HGBUR NEGATIVE 07/18/2017 0300   BILIRUBINUR NEGATIVE 07/18/2017 0300   KETONESUR NEGATIVE 07/18/2017 0300   PROTEINUR NEGATIVE 07/18/2017 0300   NITRITE NEGATIVE 07/18/2017 0300   LEUKOCYTESUR NEGATIVE 07/18/2017 0300   Sepsis Labs: @LABRCNTIP (procalcitonin:4,lacticidven:4) )No results found for this or any previous visit (from the past 240 hour(s)).   Radiological Exams on Admission: No results found.  EKG: Not performed.   Assessment/Plan  1. GIB; symptomatic anemia  - Presents with fatigue, DOE, and dark stools  - Found to have Hgb of 6.8, down from 8-9 last month  - GI consulting and  much appreciated, suspect recurrent bleeding secondary to GAVE  - 1 unit RBC ordered for transfusion  - Check post-transfusion CBC, continue supportive care    2. Cirrhosis  - NASH, with varices - Appears well-compensated on admission  - Inderal and diuretics held in light of low BP in ED    3. CAD  - No anginal complaints  - Continue Crestor    4. Hypothyroidism  - Continue Synthroid   5. Insulin-dependent DM  - A1c was only 4.2% in October 2018  - Managed at home with Lantus 44 units qHS and Novolog 1-10 TID  - Follow CBG's q4h, continue Lantus with 30 units qHS and SSI with Novolog   6. Thrombocytopenia  - Platelets 44k on admission  - Similar to priors, likely secondary to cirrhosis     DVT prophylaxis: SCD's Code Status: Full  Family Communication: Husband updated at bedside Disposition Plan: Admit to telemetry Consults called: Gastroenterology  Admission status: Inpatient    Vianne Bulls, MD [facility] Pager [phone]  If 7PM-7AM, please contact night-coverage [email or web address] Password [facility]  11/07/2017, 10:58 PM

## 2017-11-07 NOTE — Telephone Encounter (Signed)
Detailed message left for patient on home number to return to ER for additional labs and blood  No answer on patients husband cell- no answer unable to leave message

## 2017-11-07 NOTE — Progress Notes (Addendum)
PCP: Dr. Woody Seller HF Cardiology: Dr. Aundra Dubin  62 y.o. sent for CHF clinic evaluation due to progressive dyspnea.  Patient had been having exertional dypsnea for a year or so prior to initial appointment.  Echo in 3/18 showed mild LVH and mild MR with normal EF.  High resolution CT chest in 5/18 did not have evidence for interstitial lung disease.   She additionally has history of NASH with cirrhosis and variceal bleeding as well as splenomegaly, followed by Dr. Laural Golden.   She was sent for right heart cath as there was worry for possible portopulmonary hypertension.  RHC showed elevated right and left heart filling pressures with pulmonary venous hypertension.  She had been taking Lasix prn.  After the cath, I increased the Lasix to 40 mg daily.   She was admitted in 10/18 with symptomatic anemia and got 3 units PRBCs. EGD showed GAVE that was treated with APC.  She was diuresed with IV Lasix that admission.   Repeat RHC in 10/18 showed normal filling pressures with mild pulmonary hypertension.  PVR not elevated.    She was admitted again in 10/18 with acute encephalopathy.  She was on torsemide 60 mg daily when admitted.  NH3 was 85, hepatic encephalopathy suspected.  AKI also noted.  Lactulose was started.  Possible that HE was triggered by dehydration.  Torsemide was stopped then later restarted with increase in weight.   She presents today for follow up. Since last visit, had torsemide decreased for symptoms of hypovolemia per GI.  Of note, she is down 14 lbs from her previous visit. Last week had large black bowel movement, but denies any other overt bleeding. Since then has had increased craving for ice, which she usually has when her Hgb is low.  She denies lightheadedness or dizziness. Remains SOB with carrying laundry and occasionally with bathing, though this has improved. Taking all medications as directed.   Labs (7/18): K 3.9, creatinine 0.8, hgb 8.1    Labs (11/18): K 3.7, creatinine 1.03 =>  0.93 Labs (1/19): hgb 8                                                                                                       PMH:  1. NASH with cirrhosis: H/o variceal bleeding.  2. Splenomegaly related to cirrhosis.  3. Sleep study 6/8 showed no significant OSA.  4. High resolution CT chest (5/18): No interstitial lung disease, +calcium in coronaries.  5. Chronic diastolic CHF:  - Echo (6/38): EF 60-65%, mild LVH, mild MR.  Negative bubble study.  - RHC (7/18): mean RA 16, PA 64/25, mean PCWP 21, CI 4.16, PVR 2.6 WU.  - RHC (10/18): mean RA 4, PA 39/15 mean 26, mean PCWP 11, CI 6.4, PVR 1.1 WU 6. Anemia 7. Thrombocytopenia (likely related to splenomegaly).  8. Upper GI bleeding: 10/18, GAVE treated with APC.  9. Diabetes: Insulin pump.  10. Hyperlipidemia  Social History   Socioeconomic History  . Marital status: Married    Spouse name: Not on file  . Number of children: Not on  file  . Years of education: Not on file  . Highest education level: Not on file  Social Needs  . Financial resource strain: Not on file  . Food insecurity - worry: Not on file  . Food insecurity - inability: Not on file  . Transportation needs - medical: Not on file  . Transportation needs - non-medical: Not on file  Occupational History  . Occupation: retired   Tobacco Use  . Smoking status: Never Smoker  . Smokeless tobacco: Never Used  Substance and Sexual Activity  . Alcohol use: No    Alcohol/week: 0.0 oz  . Drug use: No  . Sexual activity: Not on file  Other Topics Concern  . Not on file  Social History Narrative   Lives with husband    Family History  Problem Relation Age of Onset  . Lung cancer Mother   . Diabetes Father   . Diabetes Sister   . Hypertension Sister   . Hypothyroidism Sister   . Colon cancer Brother   . Diabetes Sister   . Hypothyroidism Brother   . Healthy Daughter   . Obesity Daughter   . Hypertension Daughter    Review of systems complete and found to be  negative unless listed in HPI.    Current Outpatient Medications  Medication Sig Dispense Refill  . cholecalciferol (VITAMIN D) 1000 UNITS tablet Take 1,000 Units by mouth 3 (three) times a week.     . fluticasone furoate-vilanterol (BREO ELLIPTA) 200-25 MCG/INH AEPB Inhale 1 puff into the lungs daily. 1 each 3  . insulin aspart (NOVOLOG) 100 UNIT/ML FlexPen Use with given sliding scale  CBG 70-120: 0 units CBG 121-150: 1 unit CBG 151-200: 2 units CBG 201-250: 3 units CBG 251-300: 5 units CBG 301-350: 7 units CBG greater than 351: 9 units (Patient taking differently: Inject 3-10 Units into the skin 3 (three) times daily with meals. ) 15 mL 11  . Insulin Glargine (LANTUS SOLOSTAR) 100 UNIT/ML Solostar Pen Inject 38 Units daily at 10 pm into the skin. (Patient taking differently: Inject 42 Units into the skin daily at 10 pm. ) 15 mL 11  . Insulin Pen Needle 31G X 5 MM MISC Use with each insulin injection 100 each 12  . lactulose (CHRONULAC) 10 GM/15ML solution Take 30 mLs (20 g total) 4 (four) times daily by mouth. (Patient taking differently: Take 20 g by mouth 4 (four) times daily. Make take an additional 20 g up to two more times daily as needed for elevated ammonia levels) 240 mL 0  . levothyroxine (SYNTHROID, LEVOTHROID) 25 MCG tablet Take 25 mcg by mouth daily before breakfast.     . magnesium oxide (MAG-OX) 400 MG tablet Take 400 mg by mouth 2 (two) times a week.    . Multiple Vitamins-Iron (MULTIVITAMIN/IRON PO) Take 1 tablet by mouth daily.    . OXYGEN Inhale 2 L into the lungs at bedtime.     . pantoprazole (PROTONIX) 40 MG tablet Take 1 tablet by mouth once daily 90 tablet 3  . PARoxetine (PAXIL) 40 MG tablet Take 40 mg by mouth every morning.    . potassium chloride SA (K-DUR,KLOR-CON) 20 MEQ tablet Take 1.5 tablets (30 mEq total) daily by mouth. 45 tablet 3  . propranolol (INDERAL) 20 MG tablet Take 1 tablet (20 mg total) by mouth at bedtime as needed (for increased BP). 90 tablet  3  . rosuvastatin (CRESTOR) 5 MG tablet Take 1 tablet (5 mg total) by mouth  every other day. 45 tablet 3  . spironolactone (ALDACTONE) 50 MG tablet Take 50 mg by mouth daily.    Marland Kitchen torsemide (DEMADEX) 20 MG tablet Take 20 mg by mouth daily. Take 20 mg one day alternating with 10 mg the next day continuously     No current facility-administered medications for this encounter.    Vitals:   11/07/17 1354  BP: (!) 122/54  Pulse: 78  SpO2: 98%  Weight: 203 lb (92.1 kg)   Wt Readings from Last 3 Encounters:  11/07/17 203 lb (92.1 kg)  10/10/17 217 lb (98.4 kg)  10/09/17 217 lb 4 oz (98.5 kg)    General: Pale appearing. No resp difficulty. HEENT: Normal Neck: Supple. JVP 7-8 cm. Carotids 2+ bilat; no bruits. No thyromegaly or nodule noted. Cor: PMI nondisplaced. RRR, No M/G/R noted Lungs: CTAB, normal effort. Abdomen: Soft, non-tender, non-distended, no HSM. No bruits or masses. +BS  Extremities: No cyanosis, clubbing, or rash. 1+ edema 1/2 to knee.   Neuro: Alert & orientedx3, cranial nerves grossly intact. moves all 4 extremities w/o difficulty. Affect pleasant    Assessment/Plan: 1. Chronic diastolic CHF: RHC in 7/97 is suggestive of significant diastolic CHF.  EF was normal by echo. Repeat RHC 10/18 showed normal filling pressures on torsemide.   - NYHA III symptoms - Volume status very mildly elevated.  - Increase torsemide back to 20 mg daily. BMET today.  - Continue spironolactone 50 mg daily given cirrhosis with ascites.  2. Pulmonary hypertension: Patient is at risk for portopulmonary hypertension.   However, RHCs in 7/18 and in 10/18 showed pulmonary venous hypertension (group 2).   - No change. 3. Cirrhosis: NASH with splenomegaly, thrombocytopenia.   - EGD 10/10/17 with moderate portal hypertensive gastropathy was found in the gastric fundus and in the gastric body. 2 cm hiatal hernia also noted.  - She is on lactulose for hepatic encephalopathy. Ammonia today.  4. CAD:  Coronary calcium on CT chest in 5/18.   - No s/s of ischemia.    - In absence of chest pain, would not do stress test or cath => poor candidate for invasive cardiac evaluation and stent placement with very low platelets.  - Continue Crestor 5 mg every other day.   5. Anemia: Followed by her GI MD.  Recent upper GI bleeding from GAVE.  - Check CBC and Anemia panel today. Pt warned if low she may need to come back to ED.  6. Thrombocytopenia: Likely related to cirrhosis and splenomegaly. No change.   Labs today. Torsemide as above. RTC 6 weeks.   Shirley Friar, PA-C  11/07/2017   Greater than 50% of the 25 minute visit was spent in counseling/coordination of care regarding disease state education, salt/fluid restriction, sliding scale diuretics, and medication compliance.

## 2017-11-07 NOTE — ED Provider Notes (Signed)
Emergency Department Provider Note   I have reviewed the triage vital signs and the nursing notes.   HISTORY  Chief Complaint Abnormal Lab   HPI Maria [name] is a 62 y.o. female with multiple medical problems as documented below the presents to the emergency department today secondary to fatigue and anemia.  Patient states that she has a history of chronic anemia secondary to hepatic failure and varices secondary to nonalcoholic fatty liver disease.  Noticed that she had been weak over the last couple weeks with a see a cardiologist today who said that she had a low hemoglobin she had to come the emergency room.  Husband states that she does appear to be more pale than normal.  She is had to have transfusions in the past. No other associated or modifying symptoms.    Past Medical History:  Diagnosis Date  . Anemia   . CHF (congestive heart failure) ([place])   . Cirrhosis ([place])   . Depression   . Diabetes mellitus   . Esophageal bleed, non-variceal   . Eye hemorrhage    Behind left eye  . Fibromyalgia   . Hypercholesteremia   . Hypertension   . Hypothyroidism   . NAFLD (nonalcoholic fatty liver disease)   . Osteoarthrosis   . Osteoporosis   . PONV (postoperative nausea and vomiting)   . UTI (urinary tract infection) 11/12 end of the month   Patient feels that she passed a kidney stone at that time    Patient Active Problem List   Diagnosis Date Noted  . Absolute anemia 09/13/2017  . Idiopathic esophageal varices with bleeding ([place]) 09/13/2017  . Other cirrhosis of liver ([place]) 08/22/2017  . NAFLD (nonalcoholic fatty liver disease) 08/14/2017  . Encephalopathy, hepatic ([place]) 08/14/2017  . Metabolic encephalopathy 31/51/7616  . Hyperammonemia ([place]) 07/18/2017  . Hypothyroidism 07/18/2017  . Acute on chronic right-sided congestive heart failure ([place]) 07/06/2017  . Pulmonary hypertension ([place]) 07/06/2017  . GAVE (gastric antral vascular ectasia) 07/06/2017  . GI  bleeding 07/06/2017  . Symptomatic anemia 07/04/2017  . Hypokalemia 07/04/2017  . Chronic diastolic heart failure ([place]) 06/11/2017  . CAD (coronary artery disease) 06/11/2017  . Nocturnal hypoxemia 02/22/2017  . Idiopathic esophageal varices without bleeding ([place]) 01/31/2017  . Dyspnea and respiratory abnormalities 01/25/2017  . Esophageal varices in cirrhosis ([place]) 05/19/2016  . Hepatic cirrhosis ([place]) 05/19/2016  . Esophageal varices ([place]) 12/09/2012  . Dysphagia, unspecified(787.20) 12/09/2012  . Anxiety 08/26/2012  . Cirrhosis, nonalcoholic ([place]) 07/37/1062  . Diabetes mellitus ([place]) 10/09/2011  . History of esophageal varices 10/09/2011  . Iron deficiency anemia 10/09/2011  . GERD (gastroesophageal reflux disease) 10/09/2011  . Thrombocytopenia ([place]) 10/09/2011    Past Surgical History:  Procedure Laterality Date  . APPENDECTOMY  1980  . BILATERAL SALPINGOOPHORECTOMY    . CHOLECYSTECTOMY    . COLONOSCOPY  03/15/2011  . COLONOSCOPY N/A 03/24/2016   Procedure: COLONOSCOPY;  Surgeon: [name], MD;  [facility];  Service: Endoscopy;  Laterality: N/A;  855  . ESOPHAGEAL BANDING  04/24/2012   Procedure: ESOPHAGEAL BANDING;  Surgeon: [name], MD;  [facility];  Service: Endoscopy;  Laterality: N/A;  . Esophageal BANDING  08/03/2012   [facility] in [place] , [place]  09/24/2012   Procedure: ESOPHAGEAL BANDING;  Surgeon: [name], MD;  Location: [facility];  Service: Endoscopy;  Laterality: N/A;  Banding x 3  . ESOPHAGEAL BANDING N/A 01/29/2013  Procedure: ESOPHAGEAL BANDING;  Surgeon: [name], MD;  [facility];  Service: Endoscopy;  Laterality: N/A;  . ESOPHAGEAL BANDING N/A 06/19/2014   Procedure: ESOPHAGEAL BANDING;  Surgeon: [name], MD;  [facility];  Service: Endoscopy;  Laterality: N/A;  . ESOPHAGEAL BANDING N/A 01/05/2016   Procedure: ESOPHAGEAL BANDING;  Surgeon:  [name], MD;  [facility];  Service: Endoscopy;  Laterality: N/A;  . ESOPHAGEAL BANDING N/A 08/03/2016   Procedure: ESOPHAGEAL BANDING;  Surgeon: [name], MD;  [facility];  Service: Endoscopy;  Laterality: N/A;  . ESOPHAGEAL BANDING N/A 05/02/2017   Procedure: ESOPHAGEAL BANDING;  Surgeon: [name], MD;  [facility];  Service: Endoscopy;  Laterality: N/A;  . ESOPHAGOGASTRODUODENOSCOPY  04/24/2012   Procedure: ESOPHAGOGASTRODUODENOSCOPY (EGD);  Surgeon: [name], MD;  [facility];  Service: Endoscopy;  Laterality: N/A;  300  . ESOPHAGOGASTRODUODENOSCOPY N/A 01/29/2013   Procedure: ESOPHAGOGASTRODUODENOSCOPY (EGD);  Surgeon: [name], MD;  [facility];  Service: Endoscopy;  Laterality: N/A;  1200  . ESOPHAGOGASTRODUODENOSCOPY N/A 05/22/2013   Procedure: ESOPHAGOGASTRODUODENOSCOPY (EGD);  Surgeon: [name], MD;  [facility];  Service: Endoscopy;  Laterality: N/A;  1:55  . ESOPHAGOGASTRODUODENOSCOPY N/A 12/31/2013   Procedure: ESOPHAGOGASTRODUODENOSCOPY (EGD);  Surgeon: [name], MD;  [facility];  Service: Endoscopy;  Laterality: N/A;  1200  . ESOPHAGOGASTRODUODENOSCOPY N/A 06/19/2014   Procedure: ESOPHAGOGASTRODUODENOSCOPY (EGD);  Surgeon: [name], MD;  [facility];  Service: Endoscopy;  Laterality: N/A;  1055  . ESOPHAGOGASTRODUODENOSCOPY N/A 01/15/2015   Procedure: ESOPHAGOGASTRODUODENOSCOPY (EGD);  Surgeon: [name], MD;  [facility];  Service: Endoscopy;  Laterality: N/A;  730 - moved to 9:45 - moved to 1250-Ann notified pt  . ESOPHAGOGASTRODUODENOSCOPY N/A 06/30/2015   Procedure: ESOPHAGOGASTRODUODENOSCOPY (EGD);  Surgeon: [name], MD;  [facility];  Service: Endoscopy;  Laterality: N/A;  1200  . ESOPHAGOGASTRODUODENOSCOPY N/A 01/05/2016   Procedure: ESOPHAGOGASTRODUODENOSCOPY (EGD);  Surgeon: [name],  MD;  [facility];  Service: Endoscopy;  Laterality: N/A;  830  . ESOPHAGOGASTRODUODENOSCOPY N/A 08/03/2016   Procedure: ESOPHAGOGASTRODUODENOSCOPY (EGD);  Surgeon: [name], MD;  [facility];  Service: Endoscopy;  Laterality: N/A;  930  . ESOPHAGOGASTRODUODENOSCOPY N/A 05/02/2017   Procedure: ESOPHAGOGASTRODUODENOSCOPY (EGD);  Surgeon: [name], MD;  [facility];  Service: Endoscopy;  Laterality: N/A;  12:00  . ESOPHAGOGASTRODUODENOSCOPY N/A 08/17/2017   Procedure: ESOPHAGOGASTRODUODENOSCOPY (EGD);  Surgeon: [name], MD;  [facility];  Service: Endoscopy;  Laterality: N/A;  8:15  . ESOPHAGOGASTRODUODENOSCOPY N/A 09/12/2017   Procedure: ESOPHAGOGASTRODUODENOSCOPY (EGD);  Surgeon: [name], MD;  [facility];  Service: Endoscopy;  Laterality: N/A;  1040  . ESOPHAGOGASTRODUODENOSCOPY N/A 10/10/2017   Procedure: ESOPHAGOGASTRODUODENOSCOPY (EGD);  Surgeon: [name], MD;  [facility];  Service: Endoscopy;  Laterality: N/A;  225  . ESOPHAGOGASTRODUODENOSCOPY (EGD) WITH PROPOFOL  09/24/2012   Procedure: ESOPHAGOGASTRODUODENOSCOPY (EGD) WITH PROPOFOL;  Surgeon: [name], MD;  [facility];  Service: Endoscopy;  Laterality: N/A;  GE junction at 36  . ESOPHAGOGASTRODUODENOSCOPY (EGD) WITH PROPOFOL N/A 07/06/2017   Procedure: ESOPHAGOGASTRODUODENOSCOPY (EGD) WITH PROPOFOL;  Surgeon: [name], MD;  [facility];  Service: Endoscopy;  Laterality: N/A;  . ESOPHAGOGASTRODUODENOSCOPY W/ BANDING  08/2010  . GIVENS CAPSULE  STUDY N/A 07/05/2017   Procedure: GIVENS CAPSULE STUDY;  Surgeon: [name], MD;  [facility];  Service: Endoscopy;  Laterality: N/A;  . HOT HEMOSTASIS N/A 08/17/2017   Procedure: HOT HEMOSTASIS (ARGON PLASMA COAGULATION/BICAP);  Surgeon: [name], MD;  [facility];  Service: Endoscopy;  Laterality: N/A;  . HOT HEMOSTASIS   09/12/2017   Procedure: HOT HEMOSTASIS (ARGON PLASMA COAGULATION/BICAP);  Surgeon: [name], MD;  [facility];  Service: Endoscopy;;  gastric  . HOT HEMOSTASIS  10/10/2017   Procedure: HOT HEMOSTASIS (ARGON PLASMA COAGULATION/BICAP);  Surgeon: [name], MD;  [facility];  Service: Endoscopy;;  . POLYPECTOMY  03/24/2016   Procedure: POLYPECTOMY;  Surgeon: [name], MD;  [facility];  Service: Endoscopy;;  sigmoid polyp  . RIGHT HEART CATH N/A 04/16/2017   Procedure: Right Heart Cath;  Surgeon: [name], MD;  Location: [facility];  Service: Cardiovascular;  Laterality: N/A;  . RIGHT HEART CATH N/A 07/12/2017   Procedure: RIGHT HEART CATH;  Surgeon: [name], MD;  Location: [facility];  Service: Cardiovascular;  Laterality: N/A;  . TONSILLECTOMY    . UPPER GASTROINTESTINAL ENDOSCOPY  03/15/2011   EGD ED BANDING/TCS  . UPPER GASTROINTESTINAL ENDOSCOPY  09/05/2010  . UPPER GASTROINTESTINAL ENDOSCOPY  08/11/2010  . VAGINAL HYSTERECTOMY      Current Outpatient Rx  . Order #: [id number] Class: Historical Med  . Order #: [id number] Class: Normal  . Order #: [id number] Class: Normal  . Order #: [id number] Class: Print  . Order #: [id number] Class: Normal  . Order #: [id number] Class: Historical Med  . Order #: [id number] Class: Historical Med  . Order #: [id number] Class: Historical Med  . Order #: [id number] Class: Normal  . Order #: [id number] Class: Historical Med  . Order #: [id number] Class: Historical Med  . Order #: [id number] Class: Normal  . Order #: [id number] Class: No Print  . Order #: [id number] Class: Normal  . Order #: [id number] Class: Historical Med  . Order #: [id number] Class: Normal  . Order #: [id number] Class: Normal    Allergies Feraheme [ferumoxytol] and Ultram [tramadol hcl]  Family History  Problem Relation Age of Onset  . Lung cancer Mother   . Diabetes Father   . Diabetes Sister   . Hypertension Sister    . Hypothyroidism Sister   . Colon cancer Brother   . Diabetes Sister   . Hypothyroidism Brother   . Healthy Daughter   . Obesity Daughter   . Hypertension Daughter     Social History Social History   Tobacco Use  . Smoking status: Never Smoker  . Smokeless tobacco: Never Used  Substance Use Topics  . Alcohol use: No    Alcohol/week: 0.0 oz  . Drug use: No    Review of Systems  All other systems negative except as documented in the HPI. All pertinent positives and negatives as reviewed in the HPI. ____________________________________________   PHYSICAL EXAM:  VITAL SIGNS: ED Triage Vitals  Enc Vitals Group     BP 11/07/17 1909 92/60     Pulse Rate 11/07/17 1909 76     Resp 11/07/17 1909 19     Temp 11/07/17 1909 97.7 F (36.5 C)     Temp Source 11/07/17 1909 Oral     SpO2 11/07/17 1909 96 %     Weight 11/07/17 1913 203 lb (92.1 kg)    Constitutional: Alert and oriented. Well appearing and in no acute distress. Eyes: Conjunctival  pallor. PERRL. EOMI. Head: Atraumatic. Nose: No congestion/rhinnorhea. Mouth/Throat: Mucous membranes are moist.  Oropharynx non-erythematous. Neck: No stridor.  No meningeal signs.   Cardiovascular: Normal rate, regular rhythm. Good peripheral circulation. Grossly normal heart sounds.   Respiratory: Normal respiratory effort.  No retractions. Lungs CTAB. Gastrointestinal: Soft and nontender. No distention.  Musculoskeletal: No lower extremity tenderness nor edema. No gross deformities of extremities. Neurologic:  Normal speech and language. No gross focal neurologic deficits are appreciated.  Skin:  Skin is pale, warm, dry and intact. No rash noted.   ____________________________________________   LABS (all labs ordered are listed, but only abnormal results are displayed)  Labs Reviewed  CBC WITH DIFFERENTIAL/PLATELET - Abnormal; Notable for the following components:      Result Value   RBC 2.78 (*)    Hemoglobin 6.8 (*)     HCT 24.0 (*)    MCH 24.5 (*)    MCHC 28.3 (*)    RDW 21.5 (*)    Platelets 44 (*)    All other components within normal limits  COMPREHENSIVE METABOLIC PANEL - Abnormal; Notable for the following components:   Sodium 134 (*)    CO2 21 (*)    Glucose, Bld 250 (*)    Calcium 8.3 (*)    Total Protein 6.3 (*)    Albumin 2.5 (*)    Total Bilirubin 1.9 (*)    All other components within normal limits  POC OCCULT BLOOD, ED - Abnormal; Notable for the following components:   Fecal Occult Bld POSITIVE (*)    All other components within normal limits  OCCULT BLOOD X 1 CARD TO LAB, STOOL  TYPE AND SCREEN  PREPARE RBC (CROSSMATCH)   ____________________________________________   PROCEDURES  Procedure(s) performed:   Procedures   ____________________________________________   INITIAL IMPRESSION / ASSESSMENT AND PLAN / ED COURSE  Hemoccult, type and screen, repeat labs, consult GI and likely admit for transfusion.   Discussed with Dr. Melony Overly who wants patient to have clear liquids after midnight and they will see her in the morning.  Hemoglobin verified is 6.8.  Transfusion initiated.  Will admit.  Pertinent labs & imaging results that were available during my care of the patient were reviewed by me and considered in my medical decision making (see chart for details).  ____________________________________________  FINAL CLINICAL IMPRESSION(S) / ED DIAGNOSES  Final diagnoses:  Symptomatic anemia     MEDICATIONS GIVEN DURING THIS VISIT:  Medications  0.9 %  sodium chloride infusion (not administered)     NEW OUTPATIENT MEDICATIONS STARTED DURING THIS VISIT:  New Prescriptions   No medications on file    Note:  This note was prepared with assistance of Dragon voice recognition software. Occasional wrong-word or sound-a-like substitutions may have occurred due to the inherent limitations of voice recognition software.   Merrily Pew, MD 11/07/17 2255

## 2017-11-07 NOTE — ED Notes (Addendum)
CRITICAL VALUE ALERT  Critical Value:  Hemoglobin 6.8  Date & Time Notied: 11/07/17 at 2215  Provider Notified: [place], EDP  Orders Received/Actions taken: Orders for blood/type and screen

## 2017-11-07 NOTE — Telephone Encounter (Signed)
Critical lab results called in from lab  Hg 6.7  Per VO Oda Kilts, [place] Patient should report to ER for further management/ evaluation and treatment   [facility] for patient to return call ASAP regarding lab results

## 2017-11-07 NOTE — ED Triage Notes (Signed)
Pt was told to come to ER for blood transfusions. Pt states she started having dark stools last week.

## 2017-11-07 NOTE — Addendum Note (Signed)
Encounter addended by: Shirley Friar, PA-C on: 11/07/2017 3:10 PM  Actions taken: Sign clinical note

## 2017-11-08 DIAGNOSIS — E039 Hypothyroidism, unspecified: Secondary | ICD-10-CM

## 2017-11-08 DIAGNOSIS — I5032 Chronic diastolic (congestive) heart failure: Secondary | ICD-10-CM

## 2017-11-08 DIAGNOSIS — D649 Anemia, unspecified: Secondary | ICD-10-CM

## 2017-11-08 DIAGNOSIS — R58 Hemorrhage, not elsewhere classified: Secondary | ICD-10-CM

## 2017-11-08 DIAGNOSIS — I8500 Esophageal varices without bleeding: Secondary | ICD-10-CM

## 2017-11-08 DIAGNOSIS — Z794 Long term (current) use of insulin: Secondary | ICD-10-CM

## 2017-11-08 DIAGNOSIS — K7460 Unspecified cirrhosis of liver: Secondary | ICD-10-CM

## 2017-11-08 DIAGNOSIS — IMO0001 Reserved for inherently not codable concepts without codable children: Secondary | ICD-10-CM

## 2017-11-08 DIAGNOSIS — F419 Anxiety disorder, unspecified: Secondary | ICD-10-CM

## 2017-11-08 DIAGNOSIS — E119 Type 2 diabetes mellitus without complications: Secondary | ICD-10-CM

## 2017-11-08 LAB — COMPREHENSIVE METABOLIC PANEL
ALT: 20 U/L (ref 14–54)
AST: 29 U/L (ref 15–41)
Albumin: 2.2 g/dL — ABNORMAL LOW (ref 3.5–5.0)
Alkaline Phosphatase: 87 U/L (ref 38–126)
Anion gap: 8 (ref 5–15)
BUN: 10 mg/dL (ref 6–20)
CO2: 23 mmol/L (ref 22–32)
Calcium: 8 mg/dL — ABNORMAL LOW (ref 8.9–10.3)
Chloride: 105 mmol/L (ref 101–111)
Creatinine, Ser: 0.74 mg/dL (ref 0.44–1.00)
GFR calc Af Amer: >60 mL/min (ref >60)
GFR calc non Af Amer: >60 mL/min (ref >60)
Glucose, Bld: 147 mg/dL — ABNORMAL HIGH (ref 65–99)
Potassium: 3.6 mmol/L (ref 3.5–5.1)
Sodium: 136 mmol/L (ref 135–145)
Total Bilirubin: 1.9 mg/dL — ABNORMAL HIGH (ref 0.3–1.2)
Total Protein: 5.7 g/dL — ABNORMAL LOW (ref 6.5–8.1)

## 2017-11-08 LAB — GLUCOSE, CAPILLARY
Glucose-Capillary: 147 mg/dL — ABNORMAL HIGH (ref 65–99)
Glucose-Capillary: 143 mg/dL — ABNORMAL HIGH (ref 65–99)
Glucose-Capillary: 131 mg/dL — ABNORMAL HIGH (ref 65–99)
Glucose-Capillary: 161 mg/dL — ABNORMAL HIGH (ref 65–99)
Glucose-Capillary: 266 mg/dL — ABNORMAL HIGH (ref 65–99)

## 2017-11-08 LAB — CBC
HCT: 23.3 % — ABNORMAL LOW (ref 36.0–46.0)
Hemoglobin: 6.7 g/dL — CL (ref 12.0–15.0)
MCH: 24.4 pg — ABNORMAL LOW (ref 26.0–34.0)
MCHC: 28.8 g/dL — ABNORMAL LOW (ref 30.0–36.0)
MCV: 84.7 fL (ref 78.0–100.0)
Platelets: UNDETERMINED 10*3/uL (ref 150–400)
RBC: 2.75 MIL/uL — ABNORMAL LOW (ref 3.87–5.11)
RDW: 21.5 % — ABNORMAL HIGH (ref 11.5–15.5)
WBC: 5.2 10*3/uL (ref 4.0–10.5)

## 2017-11-08 LAB — CBC WITH DIFFERENTIAL/PLATELET
Basophils Absolute: 0 10*3/uL (ref 0.0–0.1)
Basophils Relative: 1 %
Eosinophils Absolute: 0.1 10*3/uL (ref 0.0–0.7)
Eosinophils Relative: 2 %
HCT: 22.3 % — ABNORMAL LOW (ref 36.0–46.0)
Hemoglobin: 6.6 g/dL — CL (ref 12.0–15.0)
Lymphocytes Relative: 26 %
Lymphs Abs: 0.9 10*3/uL (ref 0.7–4.0)
MCH: 25.1 pg — ABNORMAL LOW (ref 26.0–34.0)
MCHC: 29.6 g/dL — ABNORMAL LOW (ref 30.0–36.0)
MCV: 84.8 fL (ref 78.0–100.0)
Monocytes Absolute: 0.4 10*3/uL (ref 0.1–1.0)
Monocytes Relative: 10 %
Neutro Abs: 2 10*3/uL (ref 1.7–7.7)
Neutrophils Relative %: 61 %
Platelets: 63 10*3/uL — ABNORMAL LOW (ref 150–400)
RBC: 2.63 MIL/uL — ABNORMAL LOW (ref 3.87–5.11)
RDW: 20.3 % — ABNORMAL HIGH (ref 11.5–15.5)
WBC: 3.4 10*3/uL — ABNORMAL LOW (ref 4.0–10.5)

## 2017-11-08 LAB — CBG MONITORING, ED: Glucose-Capillary: 240 mg/dL — ABNORMAL HIGH (ref 65–99)

## 2017-11-08 LAB — PREPARE RBC (CROSSMATCH)

## 2017-11-08 LAB — MAGNESIUM: Magnesium: 1.7 mg/dL (ref 1.7–2.4)

## 2017-11-08 MED ORDER — INSULIN GLARGINE 100 UNIT/ML ~~LOC~~ SOLN
30.0000 [IU] | Freq: Every day | SUBCUTANEOUS | Status: DC
  Administered 2017-11-09: 30 [IU] via SUBCUTANEOUS
  Filled 2017-11-08 (×5): qty 0.3

## 2017-11-08 MED ORDER — MAGNESIUM SULFATE 4 GM/100ML IV SOLN
4.0000 g | Freq: Once | INTRAVENOUS | Status: AC
  Administered 2017-11-08: 4 g via INTRAVENOUS
  Filled 2017-11-08: qty 100

## 2017-11-08 MED ORDER — DIPHENHYDRAMINE HCL 25 MG PO CAPS
25.0000 mg | ORAL_CAPSULE | Freq: Once | ORAL | Status: AC
  Administered 2017-11-08: 25 mg via ORAL
  Filled 2017-11-08: qty 1

## 2017-11-08 MED ORDER — SODIUM CHLORIDE 0.9 % IV SOLN
Freq: Once | INTRAVENOUS | Status: AC
  Administered 2017-11-08: 10:00:00 via INTRAVENOUS

## 2017-11-08 MED ORDER — FUROSEMIDE 10 MG/ML IJ SOLN
20.0000 mg | Freq: Once | INTRAMUSCULAR | Status: AC
  Administered 2017-11-08: 20 mg via INTRAVENOUS
  Filled 2017-11-08: qty 2

## 2017-11-08 MED ORDER — ACETAMINOPHEN 325 MG PO TABS
650.0000 mg | ORAL_TABLET | Freq: Once | ORAL | Status: AC
  Administered 2017-11-08: 650 mg via ORAL
  Filled 2017-11-08: qty 2

## 2017-11-08 NOTE — Progress Notes (Addendum)
CRITICAL VALUE ALERT  Critical Value: HGB 6.6  Date & Time Notied:  11/08/17 0930  Provider Notified: [place]  Orders Received/Actions taken: Transfuse 2 units

## 2017-11-08 NOTE — Progress Notes (Signed)
Inpatient Diabetes Program Recommendations  AACE/ADA: New Consensus Statement on Inpatient Glycemic Control (2015)  Target Ranges:  Prepandial:   less than 140 mg/dL      Peak postprandial:   less than 180 mg/dL (1-2 hours)      Critically ill patients:  140 - 180 mg/dL   Results for PHILA, SHOAF (MRN [id number]) as of 11/08/2017 08:28  Ref. Range 11/08/2017 01:04 11/08/2017 02:11 11/08/2017 07:35  Glucose-Capillary Latest Ref Range: 65 - 99 mg/dL 240 (H) 147 (H) 143 (H)  Results for RUHANI, UMLAND (MRN [id number]) as of 11/08/2017 08:28  Ref. Range 11/07/2017 14:21 11/07/2017 21:07  Glucose Latest Ref Range: 65 - 99 mg/dL 246 (H) 250 (H)   Review of Glycemic Control  Diabetes history: DM2 Outpatient Diabetes medications: Lantus 44 units QAM, Novolog 0-10 units TID with meals Current orders for Inpatient glycemic control: Lantus 30 units QHS, Novolog 0-9 units Q4H  Inpatient Diabetes Program Recommendations: Insulin - Basal: Per home medication list, patient takes Lantus 44 units QAM at home. Currently patient is ordered Lantus 30 units QHS. Lantus was NOT GIVEN last night and fasting glucose 143 mg/dl this morning. Please consider changing frequency of Lantus to 30 units QAM (since patient normally takes it in the morning at home).  NOTE: NO insulin has been given since patient was admitted.  Thanks, Barnie Alderman, RN, MSN, CDE Diabetes Coordinator Inpatient Diabetes Program [phone] (Team Pager from 8am to 5pm)

## 2017-11-08 NOTE — Consult Note (Signed)
Reason for Consult: anemia Referring Physician:   [name] is an 62 y.o. female.  HPI: Admitted thru the ED today. Weakness for several weeks. She says she had cut back on her Lactulose 1-2 doses a day. She says last week she had a black BM. Usually has a BM x 3 a day. Recently cut back on Lasix on Spironolactone last week.  She says she started craving ice.  Hx of NAFLD/cirrhosis complicated by esophageal varices. Her last EGD with APC therapy was in January of this year.  In the ED, noted her hemoglobin was 6.7. Has received 1 unit of PRBCs.  Has not had a post transfusion CBC. Fecal occult blood was positive in the ED.  Past Medical History:  Diagnosis Date  . Anemia   . CHF (congestive heart failure) ([place])   . Cirrhosis ([place])   . Depression   . Diabetes mellitus   . Esophageal bleed, non-variceal   . Eye hemorrhage    Behind left eye  . Fibromyalgia   . Hypercholesteremia   . Hypertension   . Hypothyroidism   . NAFLD (nonalcoholic fatty liver disease)   . Osteoarthrosis   . Osteoporosis   . PONV (postoperative nausea and vomiting)   . UTI (urinary tract infection) 11/12 end of the month   Patient feels that she passed a kidney stone at that time    Past Surgical History:  Procedure Laterality Date  . APPENDECTOMY  1980  . BILATERAL SALPINGOOPHORECTOMY    . CHOLECYSTECTOMY    . COLONOSCOPY  03/15/2011  . COLONOSCOPY N/A 03/24/2016   Procedure: COLONOSCOPY;  Surgeon: [name], MD;  [facility];  Service: Endoscopy;  Laterality: N/A;  855  . ESOPHAGEAL BANDING  04/24/2012   Procedure: ESOPHAGEAL BANDING;  Surgeon: [name], MD;  [facility];  Service: Endoscopy;  Laterality: N/A;  . Esophageal BANDING  08/03/2012   [facility] in [place] , [place]  09/24/2012   Procedure: ESOPHAGEAL BANDING;  Surgeon: [name], MD;  Location: [facility];  Service: Endoscopy;  Laterality: N/A;  Banding x 3  .  ESOPHAGEAL BANDING N/A 01/29/2013   Procedure: ESOPHAGEAL BANDING;  Surgeon: [name], MD;  [facility];  Service: Endoscopy;  Laterality: N/A;  . ESOPHAGEAL BANDING N/A 06/19/2014   Procedure: ESOPHAGEAL BANDING;  Surgeon: [name], MD;  [facility];  Service: Endoscopy;  Laterality: N/A;  . ESOPHAGEAL BANDING N/A 01/05/2016   Procedure: ESOPHAGEAL BANDING;  Surgeon: [name], MD;  [facility];  Service: Endoscopy;  Laterality: N/A;  . ESOPHAGEAL BANDING N/A 08/03/2016   Procedure: ESOPHAGEAL BANDING;  Surgeon: [name], MD;  [facility];  Service: Endoscopy;  Laterality: N/A;  . ESOPHAGEAL BANDING N/A 05/02/2017   Procedure: ESOPHAGEAL BANDING;  Surgeon: [name], MD;  [facility];  Service: Endoscopy;  Laterality: N/A;  . ESOPHAGOGASTRODUODENOSCOPY  04/24/2012   Procedure: ESOPHAGOGASTRODUODENOSCOPY (EGD);  Surgeon: [name], MD;  [facility];  Service: Endoscopy;  Laterality: N/A;  300  . ESOPHAGOGASTRODUODENOSCOPY N/A 01/29/2013   Procedure: ESOPHAGOGASTRODUODENOSCOPY (EGD);  Surgeon: [name], MD;  [facility];  Service: Endoscopy;  Laterality: N/A;  1200  . ESOPHAGOGASTRODUODENOSCOPY N/A 05/22/2013   Procedure: ESOPHAGOGASTRODUODENOSCOPY (EGD);  Surgeon: [name], MD;  [facility];  Service: Endoscopy;  Laterality: N/A;  1:55  .  ESOPHAGOGASTRODUODENOSCOPY N/A 12/31/2013   Procedure: ESOPHAGOGASTRODUODENOSCOPY (EGD);  Surgeon: [name], MD;  [facility];  Service: Endoscopy;  Laterality: N/A;  1200  . ESOPHAGOGASTRODUODENOSCOPY N/A 06/19/2014   Procedure: ESOPHAGOGASTRODUODENOSCOPY (EGD);  Surgeon: [name], MD;  [facility];  Service: Endoscopy;  Laterality: N/A;  1055  . ESOPHAGOGASTRODUODENOSCOPY N/A 01/15/2015   Procedure: ESOPHAGOGASTRODUODENOSCOPY (EGD);  Surgeon: [name], MD;  [facility];  Service: Endoscopy;  Laterality: N/A;  730 - moved to 9:45 - moved to 1250-Ann notified pt  . ESOPHAGOGASTRODUODENOSCOPY N/A 06/30/2015   Procedure: ESOPHAGOGASTRODUODENOSCOPY (EGD);  Surgeon: [name], MD;  [facility];  Service: Endoscopy;  Laterality: N/A;  1200  . ESOPHAGOGASTRODUODENOSCOPY N/A 01/05/2016   Procedure: ESOPHAGOGASTRODUODENOSCOPY (EGD);  Surgeon: [name], MD;  [facility];  Service: Endoscopy;  Laterality: N/A;  830  . ESOPHAGOGASTRODUODENOSCOPY N/A 08/03/2016   Procedure: ESOPHAGOGASTRODUODENOSCOPY (EGD);  Surgeon: [name], MD;  [facility];  Service: Endoscopy;  Laterality: N/A;  930  . ESOPHAGOGASTRODUODENOSCOPY N/A 05/02/2017   Procedure: ESOPHAGOGASTRODUODENOSCOPY (EGD);  Surgeon: [name], MD;  [facility];  Service: Endoscopy;  Laterality: N/A;  12:00  . ESOPHAGOGASTRODUODENOSCOPY N/A 08/17/2017   Procedure: ESOPHAGOGASTRODUODENOSCOPY (EGD);  Surgeon: [name], MD;  [facility];  Service: Endoscopy;  Laterality: N/A;  8:15  . ESOPHAGOGASTRODUODENOSCOPY N/A 09/12/2017   Procedure: ESOPHAGOGASTRODUODENOSCOPY (EGD);  Surgeon: [name], MD;  [facility];  Service: Endoscopy;  Laterality: N/A;  1040  . ESOPHAGOGASTRODUODENOSCOPY N/A 10/10/2017   Procedure: ESOPHAGOGASTRODUODENOSCOPY (EGD);  Surgeon: [name], MD;  [facility];  Service: Endoscopy;  Laterality: N/A;  225  . ESOPHAGOGASTRODUODENOSCOPY (EGD) WITH PROPOFOL  09/24/2012   Procedure: ESOPHAGOGASTRODUODENOSCOPY (EGD) WITH PROPOFOL;  Surgeon: [name], MD;  [facility];  Service: Endoscopy;  Laterality: N/A;  GE junction at 36  . ESOPHAGOGASTRODUODENOSCOPY (EGD) WITH PROPOFOL N/A 07/06/2017   Procedure: ESOPHAGOGASTRODUODENOSCOPY (EGD) WITH PROPOFOL;  Surgeon: [name], MD;  [facility];  Service: Endoscopy;  Laterality: N/A;  .  ESOPHAGOGASTRODUODENOSCOPY W/ BANDING  08/2010  . GIVENS CAPSULE STUDY N/A 07/05/2017   Procedure: GIVENS CAPSULE STUDY;  Surgeon: [name], MD;  [facility];  Service: Endoscopy;  Laterality: N/A;  . HOT HEMOSTASIS N/A 08/17/2017   Procedure: HOT HEMOSTASIS (ARGON PLASMA COAGULATION/BICAP);  Surgeon: [name], MD;  [facility];  Service: Endoscopy;  Laterality: N/A;  . HOT HEMOSTASIS  09/12/2017   Procedure: HOT HEMOSTASIS (ARGON PLASMA COAGULATION/BICAP);  Surgeon: [name], MD;  [facility];  Service: Endoscopy;;  gastric  . HOT HEMOSTASIS  10/10/2017   Procedure: HOT HEMOSTASIS (ARGON PLASMA COAGULATION/BICAP);  Surgeon: [name], MD;  [facility];  Service: Endoscopy;;  . POLYPECTOMY  03/24/2016   Procedure: POLYPECTOMY;  Surgeon: [name], MD;  [facility];  Service: Endoscopy;;  sigmoid polyp  . RIGHT HEART CATH N/A 04/16/2017   Procedure: Right Heart Cath;  Surgeon: [name], MD;  Location: [place] [facility];  Service: Cardiovascular;  Laterality: N/A;  . RIGHT HEART CATH N/A 07/12/2017   Procedure: RIGHT HEART CATH;  Surgeon: [name], MD;  Location: [place] [facility];  Service: Cardiovascular;  Laterality: N/A;  . TONSILLECTOMY    . UPPER GASTROINTESTINAL ENDOSCOPY  03/15/2011   EGD ED BANDING/TCS  .  UPPER GASTROINTESTINAL ENDOSCOPY  09/05/2010  . UPPER GASTROINTESTINAL ENDOSCOPY  08/11/2010  . VAGINAL HYSTERECTOMY      Family History  Problem Relation Age of Onset  . Lung cancer Mother   . Diabetes Father   . Diabetes Sister   . Hypertension Sister   . Hypothyroidism Sister   . Colon cancer Brother   . Diabetes Sister   . Hypothyroidism Brother   . Healthy Daughter   . Obesity Daughter   . Hypertension Daughter     Social History:  reports that  has never smoked. she has never used smokeless tobacco. She reports that she does not drink alcohol or use  drugs.  Allergies:  Allergies  Allergen Reactions  . Feraheme [Ferumoxytol] Other (See Comments)    Patient has severe back and chest pain when receiving FERAHEME infusions.  Marland Kitchen Ultram [Tramadol Hcl] Other (See Comments)    WEAKNESS    Medications: I have reviewed the patient's current medications.  Results for orders placed or performed during the hospital encounter of 11/07/17 (from the past 48 hour(s))  POC occult blood, ED     Status: Abnormal   Collection Time: 11/07/17  8:48 PM  Result Value Ref Range   Fecal Occult Bld POSITIVE (A) NEGATIVE  CBC with Differential     Status: Abnormal   Collection Time: 11/07/17  9:07 PM  Result Value Ref Range   WBC 4.9 4.0 - 10.5 K/uL   RBC 2.78 (L) 3.87 - 5.11 MIL/uL    Comment: ANISOCYTES POLYCHROMASIA PRESENT    Hemoglobin 6.8 (LL) 12.0 - 15.0 g/dL    Comment: REPEATED TO VERIFY CRITICAL RESULT CALLED TO, READ BACK BY AND VERIFIED WITH: LONG,H @ 2200 ON 2.20.19 BY BOWMAN,L    HCT 24.0 (L) 36.0 - 46.0 %   MCV 86.3 78.0 - 100.0 fL   MCH 24.5 (L) 26.0 - 34.0 pg   MCHC 28.3 (L) 30.0 - 36.0 g/dL   RDW 21.5 (H) 11.5 - 15.5 %   Platelets 44 (L) 150 - 400 K/uL    Comment: SPECIMEN CHECKED FOR CLOTS PLATELET COUNT CONFIRMED BY SMEAR    Neutrophils Relative % 65 %   Neutro Abs 3.2 1.7 - 7.7 K/uL   Lymphocytes Relative 21 %   Lymphs Abs 1.1 0.7 - 4.0 K/uL   Monocytes Relative 11 %   Monocytes Absolute 0.5 0.1 - 1.0 K/uL   Eosinophils Relative 2 %   Eosinophils Absolute 0.1 0.0 - 0.7 K/uL   Basophils Relative 1 %   Basophils Absolute 0.1 0.0 - 0.1 K/uL    Comment: Performed at [facility], [address], [place], [place] [address]  Comprehensive metabolic panel     Status: Abnormal   Collection Time: 11/07/17  9:07 PM  Result Value Ref Range   Sodium 134 (L) 135 - 145 mmol/L   Potassium 4.1 3.5 - 5.1 mmol/L   Chloride 107 101 - 111 mmol/L   CO2 21 (L) 22 - 32 mmol/L   Glucose, Bld 250 (H) 65 - 99 mg/dL   BUN 11 6 - 20 mg/dL    Creatinine, Ser 0.76 0.44 - 1.00 mg/dL   Calcium 8.3 (L) 8.9 - 10.3 mg/dL   Total Protein 6.3 (L) 6.5 - 8.1 g/dL   Albumin 2.5 (L) 3.5 - 5.0 g/dL   AST 33 15 - 41 U/L   ALT 23 14 - 54 U/L   Alkaline Phosphatase 102 38 - 126 U/L   Total Bilirubin 1.9 (  H) 0.3 - 1.2 mg/dL   GFR calc non Af Amer >60 >60 mL/min   GFR calc Af Amer >60 >60 mL/min    Comment: (NOTE) The eGFR has been calculated using the CKD EPI equation. This calculation has not been validated in all clinical situations. eGFR's persistently <60 mL/min signify possible Chronic Kidney Disease.    Anion gap 6 5 - 15    Comment: Performed at [facility], [address], [place], [place] [address]  Type and screen Ordered by PROVIDER DEFAULT     Status: None (Preliminary result)   Collection Time: 11/07/17 10:13 PM  Result Value Ref Range   ABO/RH(D) O NEG    Antibody Screen NEG    Sample Expiration 11/10/2017    Unit Number [id number]    Blood Component Type RCLI PHER 1    Unit division 00    Status of Unit ISSUED    Transfusion Status OK TO TRANSFUSE    Crossmatch Result COMPATIBLE   Prepare RBC     Status: None   Collection Time: 11/07/17 10:30 PM  Result Value Ref Range   Order Confirmation      ORDER PROCESSED BY BLOOD BANK Performed at [facility], [address], [place], [place] [address]   CBG monitoring, ED     Status: Abnormal   Collection Time: 11/08/17  1:04 AM  Result Value Ref Range   Glucose-Capillary 240 (H) 65 - 99 mg/dL   Comment 1 Notify RN    Comment 2 Document in Chart   Glucose, capillary     Status: Abnormal   Collection Time: 11/08/17  2:11 AM  Result Value Ref Range   Glucose-Capillary 147 (H) 65 - 99 mg/dL  Glucose, capillary     Status: Abnormal   Collection Time: 11/08/17  7:35 AM  Result Value Ref Range   Glucose-Capillary 143 (H) 65 - 99 mg/dL    No results found.  ROS Blood pressure (!) 97/30, pulse 74, temperature 99.1 F (37.3 C), temperature source Oral, resp. rate  18, height 5' 4"  (1.626 m), weight 202 lb 9.6 oz (91.9 kg), SpO2 95 %. Physical Exam Alert and oriented. Skin warm and dry. Oral mucosa is moist.   . Sclera anicteric, conjunctivae is pink. Thyroid not enlarged. No cervical lymphadenopathy. Lungs clear. Heart regular rate and rhythm.  Abdomen is soft. Bowel sounds are positive. No hepatomegaly. No abdominal masses felt. No tenderness.  No edema to lower extremities.      Assessment/Plan: Probable UGI bleed from AVM or varices. Has received 1 unit of PRBCs. Dr. Laural Golden is aware  Butch Penny 11/08/2017, 7:46 AM

## 2017-11-08 NOTE — Progress Notes (Signed)
Lab contacted, Blood is not ready at this time.

## 2017-11-08 NOTE — Progress Notes (Signed)
PROGRESS NOTE    ANALYSA NUTTING  VVO:[id number] DOB: 1956-02-26 DOA: 11/07/2017 PCP: Glenda Chroman, MD   Brief Narrative:  Patient is a 62 year old female history of [name] cirrhosis with esophageal varices, hepatic encephalopathy, history of GI bleed felt secondary to gastric antral vascular ectasia, ABDs mellitus, coronary artery disease, depression presented to the ED with generalized weakness, fatigue, exertional dyspnea, melanotic stools approximately 1 week prior to admission.  Patient noted to have borderline hypotension and hemoglobin of 6.8 on admission.  FOBT was positive.  Patient transfused 1 unit of packed red blood cells.  Gastroenterology consulted.   Assessment & Plan:   Principal Problem:   Symptomatic anemia Active Problems:   Cirrhosis, nonalcoholic (HCC)   Thrombocytopenia (HCC)   Anxiety   Esophageal varices (HCC)   Chronic diastolic heart failure (HCC)   CAD (coronary artery disease)   Pulmonary hypertension (HCC)   GAVE (gastric antral vascular ectasia)   Hypothyroidism   Insulin dependent diabetes mellitus ([place])  #1 symptomatic anemia Questionable etiology.  Patient with recent upper GI bleed felt secondary to GAVE and treated with APC per gastroenterology.  Patient now presenting with symptomatic anemia noted to have a hemoglobin of 6.8 and melanotic stools approximately 1 week prior to admission.  Patient denies any further melanotic stools.  Posttransfusion CBC after 1 unit of packed red blood cells on admission was 6.6.  We will transfuse 2 more units of packed red blood cells.  Patient currently on clear liquids.  GI consulted and patient for probable upper endoscopy tomorrow 11/09/2017.  Continue Protonix.  Per GI.  2.  Acute blood loss anemia Secondary to problem #1.  3.  Hypothyroidism Continue home dose Synthroid.  4.  NASH cirrhosis with history of esophageal varices Currently compensated.  Diuretics and Inderal on hold secondary to borderline  blood pressure.  Follow and defer resumption of diuretics to gastroenterology.  5.  Chronic diastolic heart failure Stable.  Continue Crestor.  Diuretics and Inderal on hold secondary to borderline blood pressure.  6.  Insulin-dependent diabetes mellitus Hemoglobin A1c was 4.19 June 2017.  CBGs are ranging from 131-161.  Continue current regimen of Lantus 30 units daily, sliding scale insulin.  7.  Depression/anxiety Stable.  Continue home regimen of Paxil.   DVT prophylaxis: SCDs Code Status: Full Family Communication: Updated patient and family at bedside. Disposition Plan: Likely home once clinically stable and per gastroenterology.   Consultants:   Gastroenterology: Dr. Laural Golden 11/08/2017  Procedures:   1 unit packed red blood cells 11/07/2017  2 units packed red blood cells 11/08/2017  Antimicrobials:   None   Subjective: Patient in bed.  States feels a little bit better however no significant change from admission.  Denies shortness of breath.  Denies chest pain.  Had a bowel movement and denies any further melanotic stools.  Objective: Vitals:   11/08/17 1542 11/08/17 1546 11/08/17 1600 11/08/17 1615  BP: (!) 118/39 (!) 118/39 (!) 118/39 (!) 117/47  Pulse: 69 69 69 68  Resp: 20 20 20 20   Temp: 98.1 F (36.7 C) 98.1 F (36.7 C) 98.1 F (36.7 C)   TempSrc: Oral Oral Oral Oral  SpO2: 94% 94%  94%  Weight:      Height:        Intake/Output Summary (Last 24 hours) at 11/08/2017 1913 Last data filed at 11/08/2017 1800 Gross per 24 hour  Intake 1433 ml  Output 1500 ml  Net -67 ml   Filed Weights   11/07/17  1913 11/08/17 0200  Weight: 92.1 kg (203 lb) 91.9 kg (202 lb 9.6 oz)    Examination:  General exam: Appears calm and comfortable  Respiratory system: Clear to auscultation. Respiratory effort normal. Cardiovascular system: S1 & S2 heard, RRR. No JVD, murmurs, rubs, gallops or clicks. No pedal edema. Gastrointestinal system: Abdomen is nondistended,  soft and nontender. No organomegaly or masses felt. Normal bowel sounds heard. Central nervous system: Alert and oriented. No focal neurological deficits. Extremities: Symmetric 5 x 5 power. Skin: No rashes, lesions or ulcers Psychiatry: Judgement and insight appear normal. Mood & affect appropriate.     Data Reviewed: I have personally reviewed following labs and imaging studies  CBC: Recent Labs  Lab 11/07/17 1421 11/07/17 2107 11/08/17 0829  WBC 5.2 4.9 3.4*  NEUTROABS  --  3.2 2.0  HGB 6.7* 6.8* 6.6*  HCT 23.3* 24.0* 22.3*  MCV 84.7 86.3 84.8  PLT PLATELET CLUMPS NOTED ON SMEAR, UNABLE TO ESTIMATE 44* 63*   Basic Metabolic Panel: Recent Labs  Lab 11/07/17 1421 11/07/17 2107 11/08/17 0829  NA 136 134* 136  K 4.3 4.1 3.6  CL 107 107 105  CO2 22 21* 23  GLUCOSE 246* 250* 147*  BUN 7 11 10   CREATININE 0.81 0.76 0.74  CALCIUM 8.3* 8.3* 8.0*  MG  --   --  1.7   GFR: Estimated Creatinine Clearance: 80.1 mL/min (by C-G formula based on SCr of 0.74 mg/dL). Liver Function Tests: Recent Labs  Lab 11/07/17 2107 11/08/17 0829  AST 33 29  ALT 23 20  ALKPHOS 102 87  BILITOT 1.9* 1.9*  PROT 6.3* 5.7*  ALBUMIN 2.5* 2.2*   No results for input(s): LIPASE, AMYLASE in the last 168 hours. Recent Labs  Lab 11/07/17 1421  AMMONIA 22   Coagulation Profile: No results for input(s): INR, PROTIME in the last 168 hours. Cardiac Enzymes: No results for input(s): CKTOTAL, CKMB, CKMBINDEX, TROPONINI in the last 168 hours. BNP (last 3 results) No results for input(s): PROBNP in the last 8760 hours. HbA1C: No results for input(s): HGBA1C in the last 72 hours. CBG: Recent Labs  Lab 11/08/17 0104 11/08/17 0211 11/08/17 0735 11/08/17 1127 11/08/17 1605  GLUCAP 240* 147* 143* 131* 161*   Lipid Profile: No results for input(s): CHOL, HDL, LDLCALC, TRIG, CHOLHDL, LDLDIRECT in the last 72 hours. Thyroid Function Tests: No results for input(s): TSH, T4TOTAL, FREET4, T3FREE,  THYROIDAB in the last 72 hours. Anemia Panel: Recent Labs    11/07/17 1421  VITAMINB12 619  FOLATE 10.8  FERRITIN 5*  TIBC 389  IRON 29   Sepsis Labs: No results for input(s): PROCALCITON, LATICACIDVEN in the last 168 hours.  No results found for this or any previous visit (from the past 240 hour(s)).       Radiology Studies: No results found.      Scheduled Meds: . [START ON 11/09/2017] cholecalciferol  1,000 Units Oral Once per day on Mon Wed Fri  . fluticasone furoate-vilanterol  1 puff Inhalation Daily  . insulin aspart  0-9 Units Subcutaneous Q4H  . insulin glargine  30 Units Subcutaneous Daily  . levothyroxine  25 mcg Oral QAC breakfast  . pantoprazole  40 mg Oral Daily  . PARoxetine  40 mg Oral Daily  . rosuvastatin  5 mg Oral QODAY  . sodium chloride flush  3 mL Intravenous Q12H  . sodium chloride flush  3 mL Intravenous Q12H   Continuous Infusions: . sodium chloride  LOS: 1 day    Time spent: 35 minutes    Irine Seal, MD [facility] Pager [phone]  If 7PM-7AM, please contact night-coverage [email or web address] Password [facility] 11/08/2017, 7:13 PM

## 2017-11-09 ENCOUNTER — Other Ambulatory Visit: Payer: Self-pay

## 2017-11-09 ENCOUNTER — Encounter (HOSPITAL_COMMUNITY)
Admission: EM | Disposition: A | Discharge status: Home / Self Care | Payer: Self-pay | Source: Home / Self Care | Attending: Internal Medicine

## 2017-11-09 ENCOUNTER — Encounter (HOSPITAL_COMMUNITY): Payer: Self-pay | Admitting: *Deleted

## 2017-11-09 DIAGNOSIS — K766 Portal hypertension: Secondary | ICD-10-CM

## 2017-11-09 DIAGNOSIS — K922 Gastrointestinal hemorrhage, unspecified: Secondary | ICD-10-CM

## 2017-11-09 DIAGNOSIS — K31811 Angiodysplasia of stomach and duodenum with bleeding: Secondary | ICD-10-CM

## 2017-11-09 DIAGNOSIS — K31819 Angiodysplasia of stomach and duodenum without bleeding: Secondary | ICD-10-CM

## 2017-11-09 DIAGNOSIS — K449 Diaphragmatic hernia without obstruction or gangrene: Secondary | ICD-10-CM

## 2017-11-09 DIAGNOSIS — K228 Other specified diseases of esophagus: Secondary | ICD-10-CM

## 2017-11-09 DIAGNOSIS — K3189 Other diseases of stomach and duodenum: Secondary | ICD-10-CM

## 2017-11-09 HISTORY — PX: ESOPHAGOGASTRODUODENOSCOPY: SHX5428

## 2017-11-09 LAB — GLUCOSE, CAPILLARY
Glucose-Capillary: 193 mg/dL — ABNORMAL HIGH (ref 65–99)
Glucose-Capillary: 194 mg/dL — ABNORMAL HIGH (ref 65–99)
Glucose-Capillary: 231 mg/dL — ABNORMAL HIGH (ref 65–99)
Glucose-Capillary: 200 mg/dL — ABNORMAL HIGH (ref 65–99)
Glucose-Capillary: 141 mg/dL — ABNORMAL HIGH (ref 65–99)

## 2017-11-09 LAB — CBC
HCT: 29.3 % — ABNORMAL LOW (ref 36.0–46.0)
Hemoglobin: 9 g/dL — ABNORMAL LOW (ref 12.0–15.0)
MCH: 26.2 pg (ref 26.0–34.0)
MCHC: 30.7 g/dL (ref 30.0–36.0)
MCV: 85.4 fL (ref 78.0–100.0)
Platelets: 58 10*3/uL — ABNORMAL LOW (ref 150–400)
RBC: 3.43 MIL/uL — ABNORMAL LOW (ref 3.87–5.11)
RDW: 18.3 % — ABNORMAL HIGH (ref 11.5–15.5)
WBC: 4.3 10*3/uL (ref 4.0–10.5)

## 2017-11-09 LAB — COMPREHENSIVE METABOLIC PANEL
ALT: 20 U/L (ref 14–54)
AST: 31 U/L (ref 15–41)
Albumin: 2.2 g/dL — ABNORMAL LOW (ref 3.5–5.0)
Alkaline Phosphatase: 96 U/L (ref 38–126)
Anion gap: 6 (ref 5–15)
BUN: 13 mg/dL (ref 6–20)
CO2: 23 mmol/L (ref 22–32)
Calcium: 7.8 mg/dL — ABNORMAL LOW (ref 8.9–10.3)
Chloride: 107 mmol/L (ref 101–111)
Creatinine, Ser: 0.81 mg/dL (ref 0.44–1.00)
GFR calc Af Amer: >60 mL/min (ref >60)
GFR calc non Af Amer: >60 mL/min (ref >60)
Glucose, Bld: 204 mg/dL — ABNORMAL HIGH (ref 65–99)
Potassium: 3.8 mmol/L (ref 3.5–5.1)
Sodium: 136 mmol/L (ref 135–145)
Total Bilirubin: 1.9 mg/dL — ABNORMAL HIGH (ref 0.3–1.2)
Total Protein: 5.8 g/dL — ABNORMAL LOW (ref 6.5–8.1)

## 2017-11-09 LAB — MAGNESIUM: Magnesium: 2.1 mg/dL (ref 1.7–2.4)

## 2017-11-09 SURGERY — EGD (ESOPHAGOGASTRODUODENOSCOPY)
Anesthesia: Moderate Sedation

## 2017-11-09 MED ORDER — MIDAZOLAM HCL 5 MG/5ML IJ SOLN
INTRAMUSCULAR | Status: AC
  Filled 2017-11-09: qty 10

## 2017-11-09 MED ORDER — LIDOCAINE VISCOUS 2 % MT SOLN
OROMUCOSAL | Status: DC | PRN
  Administered 2017-11-09: 4 mL via OROMUCOSAL

## 2017-11-09 MED ORDER — SPIRONOLACTONE 50 MG PO TABS: 50.0000 mg | ORAL_TABLET | Freq: Two times a day (BID) | ORAL | Status: DC

## 2017-11-09 MED ORDER — MIDAZOLAM HCL 5 MG/5ML IJ SOLN
INTRAMUSCULAR | Status: DC | PRN
  Administered 2017-11-09 (×4): 2 mg via INTRAVENOUS

## 2017-11-09 MED ORDER — STERILE WATER FOR IRRIGATION IR SOLN
Status: DC | PRN
  Administered 2017-11-09: 14:00:00

## 2017-11-09 MED ORDER — SODIUM CHLORIDE 0.9 % IV SOLN: INTRAVENOUS | Status: DC

## 2017-11-09 MED ORDER — LIDOCAINE VISCOUS 2 % MT SOLN
OROMUCOSAL | Status: AC
  Filled 2017-11-09: qty 15

## 2017-11-09 MED ORDER — MEPERIDINE HCL 50 MG/ML IJ SOLN
INTRAMUSCULAR | Status: DC | PRN
  Administered 2017-11-09 (×2): 25 mg via INTRAVENOUS

## 2017-11-09 MED ORDER — MEPERIDINE HCL 50 MG/ML IJ SOLN
INTRAMUSCULAR | Status: AC
  Filled 2017-11-09: qty 1

## 2017-11-09 MED ORDER — TORSEMIDE 20 MG PO TABS: 20.0000 mg | ORAL_TABLET | Freq: Every day | ORAL | 11 refills | Status: DC

## 2017-11-09 NOTE — Progress Notes (Signed)
Patient has no complaints. He had normal stool this morning. She complains of mild epigastric discomfort. Hemoglobin from this morning is 9 g. Platelet count 58K.  Patient has received 3 units of PRBCs. She is hemodynamically stable. She is suspected to be losing blood from gastric antral vascular ectasia despite APC therapy. She will undergo EGD later today.

## 2017-11-09 NOTE — Op Note (Signed)
[facility] Patient Name: Maria [name] Procedure Date: 11/09/2017 2:21 PM MRN: [id number] Date of Birth: 12/24/1955 Attending MD: Hildred Laser , MD CSN: [id number] Age: 62 Admit Type: Inpatient Procedure:                Upper GI endoscopy Indications:              Therapeutic procedure, Suspected upper                            gastrointestinal bleeding in patient with chronic                            blood loss Providers:                Hildred Laser, MD, Otis Peak B. Sharon Seller, RN, Randa Spike, Technician Referring MD:             Glenda Chroman, MD Medicines:                Lidocaine spray, Meperidine 50 mg IV, Midazolam 8                            mg IV Complications:            No immediate complications. Estimated Blood Loss:     Estimated blood loss: none. Procedure:                Pre-Anesthesia Assessment:                           - Prior to the procedure, a History and Physical                            was performed, and patient medications and                            allergies were reviewed. The patient's tolerance of                            previous anesthesia was also reviewed. The risks                            and benefits of the procedure and the sedation                            options and risks were discussed with the patient.                            All questions were answered, and informed consent                            was obtained. Prior Anticoagulants: The patient has  taken no previous anticoagulant or antiplatelet                            agents. ASA Grade Assessment: III - A patient with                            severe systemic disease. After reviewing the risks                            and benefits, the patient was deemed in                            satisfactory condition to undergo the procedure.                           After obtaining informed consent, the  endoscope was                            passed under direct vision. Throughout the                            procedure, the patient's blood pressure, pulse, and                            oxygen saturations were monitored continuously. The                            EG-[id number] ([id number]) scope was introduced through the                            mouth, and advanced to the second part of duodenum.                            The upper GI endoscopy was accomplished without                            difficulty. The patient tolerated the procedure                            well. Scope In: 2:36:11 PM Scope Out: 2:49:28 PM Total Procedure Duration: 0 hours 13 minutes 17 seconds  Findings:      The proximal esophagus was normal.      A post variceal banding scar was found in the mid esophagus and in the       distal esophagus.      The Z-line was regular and was found 33 cm from the incisors.      A 2 cm hiatal hernia was present.      Moderate portal hypertensive gastropathy was found in the cardia, in the       gastric fundus and in the gastric body.      Severe gastric antral vascular ectasia with bleeding was present in the       gastric antrum and in the prepyloric region of the stomach. Coagulation       for hemostasis using argon plasma  was successful.      Patchy moderate mucosal changes characterized by congestion, erythema       and nodularity were found in the duodenal bulb and in the second portion       of the duodenum. Impression:               - Normal proximal esophagus.                           - Scar in the mid esophagus and in the distal                            esophagus. No evidence of recurrent varices.                           - Z-line regular, 33 cm from the incisors.                           - 2 cm hiatal hernia.                           - Portal hypertensive gastropathy.                           - Gastric antral vascular ectasia with bleeding.                             Treated with argon plasma coagulation (APC). All of                            the lesions were not treated.                           - Mucosal changes in the duodenum.                           - No specimens collected. Moderate Sedation:      Moderate (conscious) sedation was administered by the endoscopy nurse       and supervised by the endoscopist. The following parameters were       monitored: oxygen saturation, heart rate, blood pressure, CO2       capnography and response to care. Total physician intraservice time was       20 minutes. Recommendation:           - Return patient to hospital ward for possible                            discharge same day.                           - Diabetic (ADA) diet today.                           - Continue present medications.                           - Repeat upper endoscopy in 4  weeks. Procedure Code(s):        --- Professional ---                           [phone], Esophagogastroduodenoscopy, flexible,                            transoral; with control of bleeding, any method                           99152, Moderate sedation services provided by the                            same physician or other qualified health care                            professional performing the diagnostic or                            therapeutic service that the sedation supports,                            requiring the presence of an independent trained                            observer to assist in the monitoring of the                            patient's level of consciousness and physiological                            status; initial 15 minutes of intraservice time,                            patient age 61 years or older Diagnosis Code(s):        --- Professional ---                           K22.8, Other specified diseases of esophagus                           K44.9, Diaphragmatic hernia without obstruction or                             gangrene                           K76.6, Portal hypertension                           K31.89, Other diseases of stomach and duodenum                           K31.811, Angiodysplasia of stomach and duodenum  with bleeding                           R58, Hemorrhage, not elsewhere classified CPT copyright 2016 American Medical Association. All rights reserved. The codes documented in this report are preliminary and upon coder review may  be revised to meet current compliance requirements. Hildred Laser, MD Hildred Laser, MD 11/09/2017 3:14:32 PM This report has been signed electronically. Number of Addenda: 0

## 2017-11-09 NOTE — Progress Notes (Signed)
Brief EGD note:  Esophageal scarring from previous banding but no evidence of recurrent varices. All sliding hiatal hernia. Moderate to severe portal hypertensive gastropathy. Gastric antral vascular ectasia with stigmata of bleed. Multiple lesions treated with APC including the ones which were covered with fresh blood. Portal hypertensive retinopathy.

## 2017-11-09 NOTE — Care Management Important Message (Signed)
Important Message  Patient Details  Name: Maria [name] MRN: [id number] Date of Birth: October 16, 1955   Medicare Important Message Given:  Yes    Sherald Barge, RN 11/09/2017, 8:20 AM

## 2017-11-09 NOTE — Progress Notes (Signed)
Inpatient Diabetes Program Recommendations  AACE/ADA: New Consensus Statement on Inpatient Glycemic Control (2015)  Target Ranges:  Prepandial:   less than 140 mg/dL      Peak postprandial:   less than 180 mg/dL (1-2 hours)      Critically ill patients:  140 - 180 mg/dL   Results for SOBIA, KARGER (MRN [id number]) as of 11/09/2017 11:38  Ref. Range 11/08/2017 07:35 11/08/2017 11:27 11/08/2017 16:05 11/08/2017 20:35  Glucose-Capillary Latest Ref Range: 65 - 99 mg/dL 143 (H)  30 units Lantus  131 (H)  1 unit Novolog  161 (H)  REFUSED Novolog 266 (H)  3 units Novolog    Results for RUTA, CAPECE (MRN [id number]) as of 11/09/2017 11:38  Ref. Range 11/09/2017 01:49 11/09/2017 07:31 11/09/2017 11:21  Glucose-Capillary Latest Ref Range: 65 - 99 mg/dL 193 (H)  Novolog NOT given? 194 (H)  2 units Novolog 231 (H)      Home DM Meds: Lantus 44 units QAM      Novolog 0-10 units TID with meals    Current Insulin Orders: Lantus 30 units daily      Novolog Sensitive Correction Scale/ SSI (0-9 units) Q4 hours             Note patient may have Endoscopy today.  Allowed CL diet this AM.  Novolog SSI has been given inconsistently.  Pt refused Novolog SSI yest at 4pm.  RN did Not document Novolog SSI being given for Midnight or 4am dose today.  Unsure why??  CBG elevated to 231 mg/dl by 11:21am today.    MD- If you note fasting glucose to be elevated tomorrow AM (02/23), please consider increasing Lantus to 35 units daily  No recommendations for Novolog SSI yet since pt getting inconsistently.    --Will follow patient during hospitalization--  Wyn Quaker RN, MSN, CDE Diabetes Coordinator Inpatient Glycemic Control Team Team Pager: [phone] (8a-5p)

## 2017-11-09 NOTE — Discharge Summary (Signed)
Physician Discharge Summary  Maria [name] VHQ:[id number] DOB: 24-Jan-1956 DOA: 11/07/2017  PCP: Glenda Chroman, MD  Admit date: 11/07/2017 Discharge date: 11/09/2017  Time spent: 60 minutes  Recommendations for Outpatient Follow-up:  1. Follow-up with Dr.Rehman, gastroenterology 11/21/2016 for lab work H&H and hospital follow-up outpatient appointment will be scheduled at that time.  Discussion on when patient's diuretics will need to be resumed will need to be assessed on follow-up.   Discharge Diagnoses:  Principal Problem:   Symptomatic anemia Active Problems:   GAVE (gastric antral vascular ectasia)   UGI bleed   Cirrhosis, nonalcoholic (HCC)   Thrombocytopenia (HCC)   Anxiety   Esophageal varices (HCC)   Chronic diastolic heart failure (HCC)   CAD (coronary artery disease)   Pulmonary hypertension (HCC)   Hypothyroidism   Insulin dependent diabetes mellitus ([place])   Discharge Condition: Stable and improved  Diet recommendation: Heart healthy/carb modified  Filed Weights   11/07/17 1913 11/08/17 0200 11/09/17 0500  Weight: 92.1 kg (203 lb) 91.9 kg (202 lb 9.6 oz) 95 kg (209 lb 7 oz)    History of present illness:  Per Dr. Leone Payor is a 62 y.o. female with medical history significant for nonalcoholic cirrhosis with varices, insulin-dependent diabetes mellitus, and coronary artery disease, presented to the emergency department with generalized weakness, fatigue, exertional dyspnea, and some dark stools last week.  Patient reported that she had been in her usual state of health until the insidious development, mild exertional dyspnea, and generalized weakness over the past few days.  She reported that her stools have been dark last week.  She saw her cardiologist the day of admission in the clinic, blood work was collected, and she was later called, told to go to the ER for evaluation of anemia.  Denied lightheadedness or chest pain.  No fevers or chills.  She has  history of variceal bleeding, more recent history of UGIB attributed to GAVE and treated with APC.  ED Course: Upon arrival to the ED, patient was found to be afebrile, saturating well on room air, and with blood pressure 105/37.  CBC was notable for hemoglobin of 6.8, down from 8-9 last month, and chronic thrombocytopenia with platelets now 44,000.  Chemistry panel was notable for glucose of 250.  DRE revealled brown stool that was FOBT positive.  Gastroenterology was consulted by the ED physician and recommended medical admission with clear liquids after midnight, indicating that there is no role for octreotide or Protonix in this situation.  A unit of packed red blood cells was ordered, patient remained hemodynamically stable, in no apparent respiratory distress, and will be admitted to the telemetry unit for ongoing evaluation and management of symptomatic anemia with occult GI bleeding likely secondary to GAVE.    Hospital Course:  #1 symptomatic anemia/upper GI bleed secondary to severe GAVE Secondary to severe gastric antral vascular ectasia with bleeding status post argon plasma coagulation noted on upper endoscopy 11/09/2017 per Dr. Laural Golden.  Patient was admitted with symptomatic anemia.  Noted to have a hemoglobin of 6.8 on admission.   Patient with recent upper GI bleed felt secondary to GAVE and treated with APC per gastroenterology.  Patient presented with symptomatic anemia noted to have a hemoglobin of 6.8 and melanotic stools approximately 1 week prior to admission.  Patient was initially transfused 1 unit packed red blood cells with posttransfusion H&H at 6.6.  Patient was transfused another 2 units of packed red blood cells during the hospitalization with  posttransfusion H&H, and up to 9.  Patient did not have any further melanotic stools during the hospitalization patient had no further overt bleeding.  Patient improved clinically in terms of her fatigue and shortness of breath.  Patient  was seen in consultation by gastroenterology and patient subsequently underwent upper endoscopy on 11/09/2017 per Dr.Rehman.  It was noted per upper endoscopy that patient had severe GAVE the gastric antrum and prepyloric region with bleeding noted.  Patient underwent APC successfully.  Patient also noted to have mucosal changes in the duodenum.  Maintained on a PPI.  Was recommended per gastroenterology that patient was stable for discharge this evening and is following up a GI's office on 11/21/2016 for H&H and thereafter H&H every 2 weeks with transfusions as needed.  Gastroenterologist, Dr. Kyung Rudd stated that he was in talks with patient's hepatologist at [facility].  Patient will follow up with GI in the outpatient setting.   2.  Acute blood loss anemia Secondary to problem #1.  Patient was transfused a total of 3 units packed red blood cells.  Hemoglobin improved and was 9.0 by day of discharge from a low of 6.6 during the hospitalization.  Patient be following up at her gastroenterologist office on 11/21/2016 when she will have H&H checked and from the have them checked every 2 weeks.  Hemoglobin will be transfused as needed.  GI will be following in the outpatient setting.  3.  Hypothyroidism Patient was maintained on her home regimen of Synthroid.  Outpatient follow-up.    4.  NASH cirrhosis with history of esophageal varices Remained compensated during the hospitalization.  Diuretics and Inderal were held secondary to borderline blood pressure.  Patient's diuretics will be held on discharge until follow-up with a gastroenterologist for further recommendations.  5.  Chronic diastolic heart failure Stable.  Maintained on home regimen of Crestor.  Patient's diuretics and Inderal were held secondary to borderline blood pressure during the hospitalization and will be held on discharge until follow-up with her gastroenterologist or cardiologist.   6.  Insulin-dependent diabetes  mellitus Hemoglobin A1c was 4.19 June 2017.  CBGs remained stable during the hospitalization.  Patient was maintained on Lantus 30 units daily as well as sliding scale insulin.  Outpatient follow-up.    7.  Depression/anxiety Patient was maintained on her home regimen of Paxil.  Patient remained in stable condition.      Procedures:  1 unit packed red blood cells 11/07/2017  2 units packed red blood cells 11/08/2017  Upper endoscopy 11/09/2017 per Dr. Laural Golden      Consultations:  Gastroenterology: Dr. Laural Golden 11/08/2017      Discharge Exam: Vitals:   11/09/17 1500 11/09/17 1536  BP:  (!) 126/56  Pulse: 84 82  Resp: 17 18  Temp:  98.5 F (36.9 C)  SpO2: 97% 94%    General: NAD Cardiovascular: RRR Respiratory: CTAB  Discharge Instructions   Discharge Instructions    Diet - low sodium heart healthy   Complete by:  As directed    Diet Carb Modified   Complete by:  As directed    Increase activity slowly   Complete by:  As directed      Allergies as of 11/09/2017      Reactions   Feraheme [ferumoxytol] Other (See Comments)   Patient has severe back and chest pain when receiving FERAHEME infusions.   Ultram [tramadol Hcl] Other (See Comments)   WEAKNESS      Medication List    STOP  taking these medications   potassium chloride SA 20 MEQ tablet Commonly known as:  K-DUR,KLOR-CON     TAKE these medications   cholecalciferol 1000 units tablet Commonly known as:  VITAMIN D Take 1,000 Units by mouth 3 (three) times a week.   FLINTSTONES PLUS IRON PO Take 2 tablets by mouth every evening.   fluticasone furoate-vilanterol 200-25 MCG/INH Aepb Commonly known as:  BREO ELLIPTA Inhale 1 puff into the lungs daily.   insulin aspart 100 UNIT/ML FlexPen Commonly known as:  NOVOLOG Use with given sliding scale  CBG 70-120: 0 units CBG 121-150: 1 unit CBG 151-200: 2 units CBG 201-250: 3 units CBG 251-300: 5 units CBG 301-350: 7 units CBG greater  than 351: 9 units What changed:    how much to take  how to take this  when to take this  additional instructions   Insulin Glargine 100 UNIT/ML Solostar Pen Commonly known as:  LANTUS SOLOSTAR Inject 38 Units daily at 10 pm into the skin. What changed:    how much to take  when to take this   Insulin Pen Needle 31G X 5 MM Misc Use with each insulin injection   lactulose 10 GM/15ML solution Commonly known as:  CHRONULAC Take 30 mLs (20 g total) 4 (four) times daily by mouth. What changed:    how much to take  when to take this  reasons to take this  additional instructions   levothyroxine 25 MCG tablet Commonly known as:  SYNTHROID, LEVOTHROID Take 25 mcg by mouth daily before breakfast.   magnesium oxide 400 MG tablet Commonly known as:  MAG-OX Take 400 mg by mouth daily.   OXYGEN Inhale 2 L into the lungs daily as needed.   pantoprazole 40 MG tablet Commonly known as:  PROTONIX Take 1 tablet by mouth once daily   PARoxetine 40 MG tablet Commonly known as:  PAXIL Take 40 mg by mouth every morning.   propranolol 20 MG tablet Commonly known as:  INDERAL Take 1 tablet (20 mg total) by mouth at bedtime as needed (for increased BP). What changed:    when to take this  additional instructions   rosuvastatin 5 MG tablet Commonly known as:  CRESTOR Take 1 tablet (5 mg total) by mouth every other day.   spironolactone 50 MG tablet Commonly known as:  ALDACTONE Take 1 tablet (50 mg total) by mouth 2 (two) times daily. Start taking on:  12/07/2017 What changed:  These instructions start on 12/07/2017. If you are unsure what to do until then, ask your doctor or other care provider.   torsemide 20 MG tablet Commonly known as:  DEMADEX Take 1 tablet (20 mg total) by mouth daily. Start taking on:  12/07/2017 What changed:  These instructions start on 12/07/2017. If you are unsure what to do until then, ask your doctor or other care provider.       Allergies  Allergen Reactions  . Feraheme [Ferumoxytol] Other (See Comments)    Patient has severe back and chest pain when receiving FERAHEME infusions.  Marland Kitchen Ultram [Tramadol Hcl] Other (See Comments)    WEAKNESS   Follow-up Information    [name], MD Follow up on 11/21/2017.   Specialty:  Gastroenterology Why:  Follow-up as scheduled on 11/21/2016 for lab work and scheduling of outpatient appointment with Dr. Laural Golden. Contact information: [address], [address] [place] [address] [phone]            The results  of significant diagnostics from this hospitalization (including imaging, microbiology, ancillary and laboratory) are listed below for reference.    Significant Diagnostic Studies: No results found.  Microbiology: No results found for this or any previous visit (from the past 240 hour(s)).   Labs: Basic Metabolic Panel: Recent Labs  Lab 11/07/17 1421 11/07/17 2107 11/08/17 0829 11/09/17 0606  NA 136 134* 136 136  K 4.3 4.1 3.6 3.8  CL 107 107 105 107  CO2 22 21* 23 23  GLUCOSE 246* 250* 147* 204*  BUN 7 11 10 13   CREATININE 0.81 0.76 0.74 0.81  CALCIUM 8.3* 8.3* 8.0* 7.8*  MG  --   --  1.7 2.1   Liver Function Tests: Recent Labs  Lab 11/07/17 2107 11/08/17 0829 11/09/17 0606  AST 33 29 31  ALT 23 20 20   ALKPHOS 102 87 96  BILITOT 1.9* 1.9* 1.9*  PROT 6.3* 5.7* 5.8*  ALBUMIN 2.5* 2.2* 2.2*   No results for input(s): LIPASE, AMYLASE in the last 168 hours. Recent Labs  Lab 11/07/17 1421  AMMONIA 22   CBC: Recent Labs  Lab 11/07/17 1421 11/07/17 2107 11/08/17 0829 11/09/17 0606  WBC 5.2 4.9 3.4* 4.3  NEUTROABS  --  3.2 2.0  --   HGB 6.7* 6.8* 6.6* 9.0*  HCT 23.3* 24.0* 22.3* 29.3*  MCV 84.7 86.3 84.8 85.4  PLT PLATELET CLUMPS NOTED ON SMEAR, UNABLE TO ESTIMATE 44* 63* 58*   Cardiac Enzymes: No results for input(s): CKTOTAL, CKMB, CKMBINDEX, TROPONINI in the last 168 hours. BNP: BNP (last 3 results) Recent Labs     04/24/17 1450  BNP 59.1    ProBNP (last 3 results) No results for input(s): PROBNP in the last 8760 hours.  CBG: Recent Labs  Lab 11/09/17 0149 11/09/17 0731 11/09/17 1121 11/09/17 1400 11/09/17 1702  GLUCAP 193* 194* 231* 200* 141*       Signed:  Irine Seal MD.  [facility] 11/09/2017, 6:02 PM

## 2017-11-11 LAB — BPAM RBC
Blood Product Expiration Date: 201903072359
Blood Product Expiration Date: 201903112359
Blood Product Expiration Date: 201903222359
ISSUE DATE / TIME: 201902210111
ISSUE DATE / TIME: 201902211114
ISSUE DATE / TIME: 201902211550
Unit Type and Rh: 9500
Unit Type and Rh: 9500
Unit Type and Rh: 9500

## 2017-11-11 LAB — TYPE AND SCREEN
ABO/RH(D): O NEG
Antibody Screen: NEGATIVE
Unit division: 0
Unit division: 0
Unit division: 0

## 2017-11-11 NOTE — Progress Notes (Signed)
Patient's IVs removed.  Sites WNL.  AVS reviewed with patient.  Verbalized understanding of discharge instructions, physician follow-up, medications.  Patient transported by NT via wheelchair to main entrance at discharge.  Patient stable at time of discharge.

## 2017-11-12 ENCOUNTER — Encounter (HOSPITAL_COMMUNITY): Payer: Self-pay | Admitting: Internal Medicine

## 2017-11-16 ENCOUNTER — Other Ambulatory Visit (INDEPENDENT_AMBULATORY_CARE_PROVIDER_SITE_OTHER): Payer: Self-pay | Admitting: *Deleted

## 2017-11-16 DIAGNOSIS — I8500 Esophageal varices without bleeding: Secondary | ICD-10-CM | POA: Diagnosis not present

## 2017-11-16 DIAGNOSIS — K31819 Angiodysplasia of stomach and duodenum without bleeding: Secondary | ICD-10-CM

## 2017-11-16 DIAGNOSIS — Z6836 Body mass index (BMI) 36.0-36.9, adult: Secondary | ICD-10-CM | POA: Diagnosis not present

## 2017-11-16 DIAGNOSIS — K922 Gastrointestinal hemorrhage, unspecified: Secondary | ICD-10-CM

## 2017-11-16 DIAGNOSIS — K7460 Unspecified cirrhosis of liver: Secondary | ICD-10-CM

## 2017-11-16 DIAGNOSIS — Z299 Encounter for prophylactic measures, unspecified: Secondary | ICD-10-CM | POA: Diagnosis not present

## 2017-11-16 DIAGNOSIS — Z09 Encounter for follow-up examination after completed treatment for conditions other than malignant neoplasm: Secondary | ICD-10-CM | POA: Diagnosis not present

## 2017-11-16 DIAGNOSIS — E1165 Type 2 diabetes mellitus with hyperglycemia: Secondary | ICD-10-CM | POA: Diagnosis not present

## 2017-11-16 DIAGNOSIS — D649 Anemia, unspecified: Secondary | ICD-10-CM | POA: Diagnosis not present

## 2017-11-16 DIAGNOSIS — K7290 Hepatic failure, unspecified without coma: Secondary | ICD-10-CM | POA: Diagnosis not present

## 2017-11-16 DIAGNOSIS — I10 Essential (primary) hypertension: Secondary | ICD-10-CM | POA: Diagnosis not present

## 2017-11-22 DIAGNOSIS — K7460 Unspecified cirrhosis of liver: Secondary | ICD-10-CM | POA: Diagnosis not present

## 2017-11-22 DIAGNOSIS — K922 Gastrointestinal hemorrhage, unspecified: Secondary | ICD-10-CM | POA: Diagnosis not present

## 2017-11-22 DIAGNOSIS — R0609 Other forms of dyspnea: Secondary | ICD-10-CM | POA: Diagnosis not present

## 2017-11-22 DIAGNOSIS — K31819 Angiodysplasia of stomach and duodenum without bleeding: Secondary | ICD-10-CM | POA: Diagnosis not present

## 2017-11-22 DIAGNOSIS — I8500 Esophageal varices without bleeding: Secondary | ICD-10-CM | POA: Diagnosis not present

## 2017-11-22 LAB — HEMOGLOBIN AND HEMATOCRIT, BLOOD
HCT: 29.3 % — ABNORMAL LOW (ref 35.0–45.0)
Hemoglobin: 9.1 g/dL — ABNORMAL LOW (ref 11.7–15.5)

## 2017-11-23 ENCOUNTER — Other Ambulatory Visit (INDEPENDENT_AMBULATORY_CARE_PROVIDER_SITE_OTHER): Payer: Self-pay | Admitting: *Deleted

## 2017-11-23 ENCOUNTER — Encounter (INDEPENDENT_AMBULATORY_CARE_PROVIDER_SITE_OTHER): Payer: Self-pay | Admitting: *Deleted

## 2017-11-23 DIAGNOSIS — D508 Other iron deficiency anemias: Secondary | ICD-10-CM

## 2017-11-27 ENCOUNTER — Ambulatory Visit
Admit: 2017-11-27 | Discharge: 2017-11-28 | Payer: PRIVATE HEALTH INSURANCE | Attending: Gastroenterology | Primary: Gastroenterology

## 2017-11-27 DIAGNOSIS — K7469 Other cirrhosis of liver: Principal | ICD-10-CM

## 2017-12-06 ENCOUNTER — Other Ambulatory Visit (HOSPITAL_COMMUNITY)
Admission: RE | Admit: 2017-12-06 | Discharge: 2017-12-06 | Disposition: A | Discharge status: Home / Self Care | Payer: PPO | Source: Ambulatory Visit | Attending: Internal Medicine | Admitting: Internal Medicine

## 2017-12-06 DIAGNOSIS — D508 Other iron deficiency anemias: Secondary | ICD-10-CM | POA: Diagnosis not present

## 2017-12-06 LAB — HEMOGLOBIN AND HEMATOCRIT, BLOOD
HCT: 28.8 % — ABNORMAL LOW (ref 36.0–46.0)
Hemoglobin: 8.5 g/dL — ABNORMAL LOW (ref 12.0–15.0)

## 2017-12-06 LAB — SAMPLE TO BLOOD BANK

## 2017-12-11 ENCOUNTER — Other Ambulatory Visit (INDEPENDENT_AMBULATORY_CARE_PROVIDER_SITE_OTHER): Payer: Self-pay | Admitting: *Deleted

## 2017-12-11 DIAGNOSIS — D649 Anemia, unspecified: Secondary | ICD-10-CM

## 2017-12-11 NOTE — Progress Notes (Signed)
Patient will go to [facility] to have lab work drawn.

## 2017-12-12 ENCOUNTER — Other Ambulatory Visit (HOSPITAL_COMMUNITY)
Admission: RE | Admit: 2017-12-12 | Discharge: 2017-12-12 | Disposition: A | Discharge status: Home / Self Care | Payer: PPO | Source: Ambulatory Visit | Attending: Internal Medicine | Admitting: Internal Medicine

## 2017-12-12 DIAGNOSIS — D649 Anemia, unspecified: Secondary | ICD-10-CM | POA: Diagnosis not present

## 2017-12-12 LAB — HEMOGLOBIN AND HEMATOCRIT, BLOOD
HCT: 29.5 % — ABNORMAL LOW (ref 36.0–46.0)
Hemoglobin: 8.6 g/dL — ABNORMAL LOW (ref 12.0–15.0)

## 2017-12-12 LAB — TYPE AND SCREEN
ABO/RH(D): O NEG
Antibody Screen: NEGATIVE

## 2017-12-18 ENCOUNTER — Ambulatory Visit (HOSPITAL_COMMUNITY)
Admission: RE | Admit: 2017-12-18 | Discharge: 2017-12-18 | Disposition: A | Discharge status: Home / Self Care | Payer: PPO | Source: Ambulatory Visit | Attending: Cardiology | Admitting: Cardiology

## 2017-12-18 VITALS — BP 127/50 | HR 65 | Wt 205.8 lb

## 2017-12-18 DIAGNOSIS — K7290 Hepatic failure, unspecified without coma: Secondary | ICD-10-CM | POA: Insufficient documentation

## 2017-12-18 DIAGNOSIS — I2510 Atherosclerotic heart disease of native coronary artery without angina pectoris: Secondary | ICD-10-CM | POA: Insufficient documentation

## 2017-12-18 DIAGNOSIS — Z794 Long term (current) use of insulin: Secondary | ICD-10-CM | POA: Insufficient documentation

## 2017-12-18 DIAGNOSIS — Z79890 Hormone replacement therapy (postmenopausal): Secondary | ICD-10-CM | POA: Diagnosis not present

## 2017-12-18 DIAGNOSIS — E785 Hyperlipidemia, unspecified: Secondary | ICD-10-CM | POA: Diagnosis not present

## 2017-12-18 DIAGNOSIS — D696 Thrombocytopenia, unspecified: Secondary | ICD-10-CM | POA: Diagnosis not present

## 2017-12-18 DIAGNOSIS — Z9641 Presence of insulin pump (external) (internal): Secondary | ICD-10-CM | POA: Insufficient documentation

## 2017-12-18 DIAGNOSIS — R161 Splenomegaly, not elsewhere classified: Secondary | ICD-10-CM | POA: Insufficient documentation

## 2017-12-18 DIAGNOSIS — E039 Hypothyroidism, unspecified: Secondary | ICD-10-CM | POA: Diagnosis not present

## 2017-12-18 DIAGNOSIS — D649 Anemia, unspecified: Secondary | ICD-10-CM | POA: Diagnosis not present

## 2017-12-18 DIAGNOSIS — K7581 Nonalcoholic steatohepatitis (NASH): Secondary | ICD-10-CM | POA: Insufficient documentation

## 2017-12-18 DIAGNOSIS — I5032 Chronic diastolic (congestive) heart failure: Secondary | ICD-10-CM | POA: Insufficient documentation

## 2017-12-18 DIAGNOSIS — Z79899 Other long term (current) drug therapy: Secondary | ICD-10-CM | POA: Diagnosis not present

## 2017-12-18 DIAGNOSIS — E119 Type 2 diabetes mellitus without complications: Secondary | ICD-10-CM | POA: Insufficient documentation

## 2017-12-18 DIAGNOSIS — I272 Pulmonary hypertension, unspecified: Secondary | ICD-10-CM | POA: Diagnosis not present

## 2017-12-18 DIAGNOSIS — K7460 Unspecified cirrhosis of liver: Secondary | ICD-10-CM | POA: Diagnosis not present

## 2017-12-18 MED ORDER — TORSEMIDE 20 MG PO TABS: 20.0000 mg | ORAL_TABLET | Freq: Every day | ORAL | 3 refills | Status: DC

## 2017-12-18 NOTE — Progress Notes (Signed)
PCP: Dr. Woody Seller HF Cardiology: Dr. Aundra Dubin  62 y.o. sent for CHF clinic evaluation due to progressive dyspnea.  Patient had been having exertional dypsnea for a year or so prior to initial appointment.  Echo in 3/18 showed mild LVH and mild MR with normal EF.  High resolution CT chest in 5/18 did not have evidence for interstitial lung disease.   She additionally has history of NASH with cirrhosis and variceal bleeding as well as splenomegaly, followed by Dr. Laural Golden.   She was sent for right heart cath as there was worry for possible portopulmonary hypertension.  RHC showed elevated right and left heart filling pressures with pulmonary venous hypertension.  She had been taking Lasix prn.  After the cath, I increased the Lasix to 40 mg daily.   She was admitted in 10/18 with symptomatic anemia and got 3 units PRBCs. EGD showed GAVE that was treated with APC.  She was diuresed with IV Lasix that admission.   Repeat RHC in 10/18 showed normal filling pressures with mild pulmonary hypertension. PVR not elevated.    She was admitted again in 10/18 with acute encephalopathy.  She was on torsemide 60 mg daily when admitted.  NH3 was 85, hepatic encephalopathy suspected.  AKI also noted.  Lactulose was started.  Possible that HE was triggered by dehydration.  Torsemide was stopped then later restarted with increase in weight.   She was admitted with GI bleed in 2/19.  EGD showed portal hypertensive gastropathy, GAVE noted.  GAVE treated with APC.    She presents today for follow up of diastolic CHF. She is currently taking torsemide 20 mg daily and spironolactone 50 mg daily.  She will adjust based on her weight. Weight is up 2 lbs compared to last appointment.  She is short of breath walking fast and walking up inclines.  No chest pain.  No orthopnea/PND.  No chest pain.   Labs (7/18): K 3.9, creatinine 0.8, hgb 8.1  Labs (8/18): LDL 51 Labs (11/18): K 3.7, creatinine 1.03 => 0.93 Labs (1/19): hgb  8 Labs (2/19): K 3.8, creatinine 0.81                                                                                                       PMH:  1. NASH with cirrhosis: H/o variceal bleeding.  - Has portal hypertensive gastropathy.  2. Splenomegaly related to cirrhosis.  3. Sleep study 6/8 showed no significant OSA.  4. High resolution CT chest (5/18): No interstitial lung disease, +calcium in coronaries.  5. Chronic diastolic CHF:  - Echo (5/85): EF 60-65%, mild LVH, mild MR.  Negative bubble study.  - RHC (7/18): mean RA 16, PA 64/25, mean PCWP 21, CI 4.16, PVR 2.6 WU.  - RHC (10/18): mean RA 4, PA 39/15 mean 26, mean PCWP 11, CI 6.4, PVR 1.1 WU 6. Anemia 7. Thrombocytopenia (likely related to splenomegaly).  8. Upper GI bleeding: 10/18, GAVE treated with APC. 2/19 GAVE treated with APC.  9. Diabetes: Insulin pump.  10. Hyperlipidemia 11. Hypothyroidism  Social History  Socioeconomic History  . Marital status: Married    Spouse name: Not on file  . Number of children: Not on file  . Years of education: Not on file  . Highest education level: Not on file  Occupational History  . Occupation: retired   Scientific laboratory technician  . Financial resource strain: Not on file  . Food insecurity:    Worry: Not on file    Inability: Not on file  . Transportation needs:    Medical: Not on file    Non-medical: Not on file  Tobacco Use  . Smoking status: Never Smoker  . Smokeless tobacco: Never Used  Substance and Sexual Activity  . Alcohol use: No    Alcohol/week: 0.0 oz  . Drug use: No  . Sexual activity: Not on file  Lifestyle  . Physical activity:    Days per week: Not on file    Minutes per session: Not on file  . Stress: Not on file  Relationships  . Social connections:    Talks on phone: Not on file    Gets together: Not on file    Attends religious service: Not on file    Active member of club or organization: Not on file    Attends meetings of clubs or organizations: Not on  file    Relationship status: Not on file  . Intimate partner violence:    Fear of current or ex partner: Not on file    Emotionally abused: Not on file    Physically abused: Not on file    Forced sexual activity: Not on file  Other Topics Concern  . Not on file  Social History Narrative   Lives with husband    Family History  Problem Relation Age of Onset  . Lung cancer Mother   . Diabetes Father   . Diabetes Sister   . Hypertension Sister   . Hypothyroidism Sister   . Colon cancer Brother   . Diabetes Sister   . Hypothyroidism Brother   . Healthy Daughter   . Obesity Daughter   . Hypertension Daughter    Review of systems complete and found to be negative unless listed in HPI.    Current Outpatient Medications  Medication Sig Dispense Refill  . cholecalciferol (VITAMIN D) 1000 UNITS tablet Take 1,000 Units by mouth 3 (three) times a week.     . fluticasone furoate-vilanterol (BREO ELLIPTA) 200-25 MCG/INH AEPB Inhale 1 puff into the lungs daily. 1 each 3  . insulin aspart (NOVOLOG) 100 UNIT/ML FlexPen Use with given sliding scale  CBG 70-120: 0 units CBG 121-150: 1 unit CBG 151-200: 2 units CBG 201-250: 3 units CBG 251-300: 5 units CBG 301-350: 7 units CBG greater than 351: 9 units (Patient taking differently: Inject 1-10 Units into the skin 3 (three) times daily with meals. Use with given sliding scale as directed with meals) 15 mL 11  . Insulin Glargine (LANTUS SOLOSTAR) 100 UNIT/ML Solostar Pen Inject 38 Units daily at 10 pm into the skin. (Patient taking differently: Inject 44 Units into the skin every morning. ) 15 mL 11  . Insulin Pen Needle 31G X 5 MM MISC Use with each insulin injection 100 each 12  . lactulose (CHRONULAC) 10 GM/15ML solution Take 30 mLs (20 g total) 4 (four) times daily by mouth. (Patient taking differently: Take 30 g by mouth 2 (two) times daily as needed. This dose to insure three bowel movements daily-may be adjusted accordingly) 240 mL 0  .  levothyroxine (SYNTHROID, LEVOTHROID) 25 MCG tablet Take 25 mcg by mouth daily before breakfast.     . magnesium oxide (MAG-OX) 400 MG tablet Take 400 mg by mouth daily.     . OXYGEN Inhale 2 L into the lungs daily as needed.     . pantoprazole (PROTONIX) 40 MG tablet Take 1 tablet by mouth once daily 90 tablet 3  . PARoxetine (PAXIL) 40 MG tablet Take 40 mg by mouth every morning.    . Pediatric Multivitamins-Iron (FLINTSTONES PLUS IRON PO) Take 2 tablets by mouth every evening.    . propranolol (INDERAL) 20 MG tablet Take 1 tablet (20 mg total) by mouth at bedtime as needed (for increased BP). (Patient taking differently: Take 20 mg by mouth daily. *May take one additional tablet as needed for high blood pressure levels*) 90 tablet 3  . rosuvastatin (CRESTOR) 5 MG tablet Take 1 tablet (5 mg total) by mouth every other day. 45 tablet 3  . spironolactone (ALDACTONE) 50 MG tablet Take 50 mg by mouth as needed.    . torsemide (DEMADEX) 20 MG tablet Take 1 tablet (20 mg total) by mouth daily. 30 tablet 3   No current facility-administered medications for this encounter.    Vitals:   12/18/17 1454  BP: (!) 127/50  Pulse: 65  SpO2: 100%  Weight: 205 lb 12.8 oz (93.4 kg)   Wt Readings from Last 3 Encounters:  12/18/17 205 lb 12.8 oz (93.4 kg)  11/09/17 209 lb 7 oz (95 kg)  11/07/17 203 lb (92.1 kg)    General: NAD Neck: JVP 8 cm, no thyromegaly or thyroid nodule.  Lungs: Clear to auscultation bilaterally with normal respiratory effort. CV: Nondisplaced PMI.  Heart regular S1/S2, no S3/S4, no murmur.  1+ edema 1/2 to knees bilaterally.  No carotid bruit.  Normal pedal pulses.  Abdomen: Soft, nontender, no hepatosplenomegaly, mild distention.  Skin: Intact without lesions or rashes.  Neurologic: Alert and oriented x 3.  Psych: Normal affect. Extremities: No clubbing or cyanosis.  HEENT: Normal.   Assessment/Plan: 1. Chronic diastolic CHF: RHC in 4/56 was suggestive of significant  diastolic CHF.  EF was normal by echo. Repeat RHC 10/18 showed normal filling pressures on torsemide.  NYHA III symptoms, chronic.  Mild volume overload on exam and weight up a bit.   - Increase torsemide to 20 mg bid x 3 days then back to 20 mg daily.   - Continue spironolactone 50 mg daily given cirrhosis with ascites.  - I will arrange for echo.  2. Pulmonary hypertension: Patient is at risk for portopulmonary hypertension.   However, RHCs in 7/18 and in 10/18 showed pulmonary venous hypertension (group 2).   3. Cirrhosis: NASH with splenomegaly, thrombocytopenia.  She is being worked up for transplant at [facility].  - She is on lactulose for hepatic encephalopathy.   4. CAD: Coronary calcium on CT chest in 5/18.  No chest pain.  - Continue Crestor 5 mg every other day, good lipids in 8/18.   5. Anemia: Followed by her GI MD.  Recent upper GI bleeding from GAVE.  - To have CBC at GI MD's office early next week.  6. Thrombocytopenia: Likely related to cirrhosis and splenomegaly. No change.   Followup in 4 months.   Loralie Champagne, MD  12/18/2017

## 2017-12-18 NOTE — Patient Instructions (Signed)
Increase Torsemide 20 mg (1 tab) twice a day for 3 days  -Then 20 mg (1 tab) daily  Your physician has requested that you have an echocardiogram. Echocardiography is a painless test that uses sound waves to create images of your heart. It provides your doctor with information about the size and shape of your heart and how well your heart's chambers and valves are working. This procedure takes approximately one hour. There are no restrictions for this procedure.  Your physician recommends that you schedule a follow-up appointment in: 4 months (August, 2019), Dr. Aundra Dubin Please Call an Schedule Appointment ,

## 2017-12-20 DIAGNOSIS — Z1231 Encounter for screening mammogram for malignant neoplasm of breast: Secondary | ICD-10-CM | POA: Diagnosis not present

## 2017-12-20 DIAGNOSIS — R928 Other abnormal and inconclusive findings on diagnostic imaging of breast: Secondary | ICD-10-CM | POA: Diagnosis not present

## 2017-12-21 ENCOUNTER — Telehealth (HOSPITAL_COMMUNITY): Payer: Self-pay

## 2017-12-21 NOTE — Telephone Encounter (Signed)
Pt called b/c she was scheduled to have an echo done. When she was called to schedule she was unable to answer she has an echo schedule at [facility] on 4/22 she will have them fax the results to [phone].

## 2017-12-23 DIAGNOSIS — R0609 Other forms of dyspnea: Secondary | ICD-10-CM | POA: Diagnosis not present

## 2017-12-25 ENCOUNTER — Institutional Professional Consult (permissible substitution): Admit: 2017-12-25 | Discharge: 2017-12-26 | Payer: PRIVATE HEALTH INSURANCE

## 2017-12-25 ENCOUNTER — Ambulatory Visit: Admit: 2017-12-25 | Discharge: 2017-12-26 | Payer: PRIVATE HEALTH INSURANCE

## 2017-12-25 ENCOUNTER — Institutional Professional Consult (permissible substitution)
Admit: 2017-12-25 | Discharge: 2017-12-26 | Payer: PRIVATE HEALTH INSURANCE | Attending: Registered" | Primary: Registered"

## 2017-12-25 DIAGNOSIS — K7290 Hepatic failure, unspecified without coma: Principal | ICD-10-CM

## 2017-12-25 DIAGNOSIS — Z01818 Encounter for other preprocedural examination: Secondary | ICD-10-CM

## 2017-12-25 DIAGNOSIS — K7469 Other cirrhosis of liver: Secondary | ICD-10-CM | POA: Diagnosis not present

## 2017-12-25 DIAGNOSIS — K766 Portal hypertension: Secondary | ICD-10-CM | POA: Diagnosis not present

## 2017-12-25 DIAGNOSIS — K7460 Unspecified cirrhosis of liver: Secondary | ICD-10-CM | POA: Diagnosis not present

## 2017-12-25 DIAGNOSIS — Z780 Asymptomatic menopausal state: Secondary | ICD-10-CM | POA: Diagnosis not present

## 2017-12-25 DIAGNOSIS — I81 Portal vein thrombosis: Secondary | ICD-10-CM | POA: Diagnosis not present

## 2017-12-25 DIAGNOSIS — K760 Fatty (change of) liver, not elsewhere classified: Secondary | ICD-10-CM | POA: Diagnosis not present

## 2017-12-25 DIAGNOSIS — M85852 Other specified disorders of bone density and structure, left thigh: Secondary | ICD-10-CM | POA: Diagnosis not present

## 2017-12-28 ENCOUNTER — Other Ambulatory Visit (INDEPENDENT_AMBULATORY_CARE_PROVIDER_SITE_OTHER): Payer: Self-pay | Admitting: *Deleted

## 2017-12-28 ENCOUNTER — Telehealth (INDEPENDENT_AMBULATORY_CARE_PROVIDER_SITE_OTHER): Payer: Self-pay | Admitting: Internal Medicine

## 2017-12-28 ENCOUNTER — Other Ambulatory Visit (HOSPITAL_COMMUNITY)
Admission: RE | Admit: 2017-12-28 | Discharge: 2017-12-28 | Disposition: A | Discharge status: Home / Self Care | Payer: PPO | Source: Ambulatory Visit | Attending: Internal Medicine | Admitting: Internal Medicine

## 2017-12-28 DIAGNOSIS — D508 Other iron deficiency anemias: Secondary | ICD-10-CM | POA: Diagnosis not present

## 2017-12-28 LAB — HEMOGLOBIN AND HEMATOCRIT, BLOOD
HCT: 24.4 % — ABNORMAL LOW (ref 36.0–46.0)
Hemoglobin: 7 g/dL — ABNORMAL LOW (ref 12.0–15.0)

## 2017-12-28 LAB — PREPARE RBC (CROSSMATCH)

## 2017-12-28 NOTE — Telephone Encounter (Signed)
Patient called stated she is having rectal bleeding again and would like you to call her back at Ph# [phone]

## 2017-12-31 ENCOUNTER — Encounter (HOSPITAL_COMMUNITY): Payer: Self-pay

## 2017-12-31 ENCOUNTER — Encounter (HOSPITAL_COMMUNITY)
Admission: RE | Admit: 2017-12-31 | Discharge: 2017-12-31 | Disposition: A | Discharge status: Home / Self Care | Payer: PPO | Source: Ambulatory Visit | Attending: Internal Medicine | Admitting: Internal Medicine

## 2017-12-31 DIAGNOSIS — K7460 Unspecified cirrhosis of liver: Secondary | ICD-10-CM | POA: Diagnosis not present

## 2017-12-31 DIAGNOSIS — D509 Iron deficiency anemia, unspecified: Secondary | ICD-10-CM | POA: Diagnosis not present

## 2017-12-31 MED ORDER — DIPHENHYDRAMINE HCL 25 MG PO CAPS
25.0000 mg | ORAL_CAPSULE | Freq: Once | ORAL | Status: AC
Start: 2017-12-31 — End: 2017-12-31
  Administered 2017-12-31: 25 mg via ORAL
  Filled 2017-12-31: qty 1

## 2017-12-31 MED ORDER — SODIUM CHLORIDE 0.9 % IV SOLN: Freq: Once | INTRAVENOUS | Status: DC

## 2017-12-31 MED ORDER — ACETAMINOPHEN 325 MG PO TABS
650.0000 mg | ORAL_TABLET | Freq: Once | ORAL | Status: AC
  Administered 2017-12-31: 650 mg via ORAL
  Filled 2017-12-31: qty 2

## 2017-12-31 MED ORDER — FUROSEMIDE 10 MG/ML IJ SOLN
20.0000 mg | Freq: Once | INTRAMUSCULAR | Status: AC
  Administered 2017-12-31: 20 mg via INTRAVENOUS
  Filled 2017-12-31: qty 4

## 2017-12-31 NOTE — Progress Notes (Signed)
Patient care taken over per myself.  She has returned from the restroom and is experiencing SOB.  Placed on 2 liters of O2 for comfort.

## 2017-12-31 NOTE — Progress Notes (Signed)
Unable to draw post transfusion lab at 1725.  Called lab at that time and have been waiting for them to collect for me.  [name] notified that it had not been drawn as of this time.  Advised to let her go home and labs could be done on Wednesday at [facility].

## 2017-12-31 NOTE — Telephone Encounter (Signed)
This has been addressed. I was off the day this message came in

## 2018-01-01 LAB — BPAM RBC
Blood Product Expiration Date: 201904272359
Blood Product Expiration Date: 201904282359
ISSUE DATE / TIME: 201904151006
ISSUE DATE / TIME: 201904151420
Unit Type and Rh: 9500
Unit Type and Rh: 9500

## 2018-01-01 LAB — TYPE AND SCREEN
ABO/RH(D): O NEG
Antibody Screen: NEGATIVE
Unit division: 0
Unit division: 0

## 2018-01-02 ENCOUNTER — Ambulatory Visit: Admit: 2018-01-02 | Discharge: 2018-01-03 | Payer: PRIVATE HEALTH INSURANCE

## 2018-01-02 ENCOUNTER — Ambulatory Visit: Admit: 2018-01-02 | Discharge: 2018-01-03 | Payer: PRIVATE HEALTH INSURANCE | Attending: Clinical | Primary: Clinical

## 2018-01-02 DIAGNOSIS — F411 Generalized anxiety disorder: Principal | ICD-10-CM

## 2018-01-02 DIAGNOSIS — Z01818 Encounter for other preprocedural examination: Secondary | ICD-10-CM

## 2018-01-02 DIAGNOSIS — K7290 Hepatic failure, unspecified without coma: Principal | ICD-10-CM

## 2018-01-02 DIAGNOSIS — F3289 Other specified depressive episodes: Secondary | ICD-10-CM

## 2018-01-05 ENCOUNTER — Other Ambulatory Visit (INDEPENDENT_AMBULATORY_CARE_PROVIDER_SITE_OTHER): Payer: Self-pay | Admitting: Internal Medicine

## 2018-01-07 ENCOUNTER — Ambulatory Visit: Admit: 2018-01-07 | Discharge: 2018-01-08 | Payer: PRIVATE HEALTH INSURANCE

## 2018-01-07 ENCOUNTER — Institutional Professional Consult (permissible substitution): Admit: 2018-01-07 | Discharge: 2018-01-08 | Payer: PRIVATE HEALTH INSURANCE

## 2018-01-07 DIAGNOSIS — Z01818 Encounter for other preprocedural examination: Secondary | ICD-10-CM

## 2018-01-07 DIAGNOSIS — K7290 Hepatic failure, unspecified without coma: Principal | ICD-10-CM

## 2018-01-07 DIAGNOSIS — K7460 Unspecified cirrhosis of liver: Principal | ICD-10-CM

## 2018-01-07 DIAGNOSIS — Z01810 Encounter for preprocedural cardiovascular examination: Secondary | ICD-10-CM | POA: Diagnosis not present

## 2018-01-07 DIAGNOSIS — E039 Hypothyroidism, unspecified: Secondary | ICD-10-CM | POA: Diagnosis not present

## 2018-01-07 DIAGNOSIS — I10 Essential (primary) hypertension: Secondary | ICD-10-CM | POA: Diagnosis not present

## 2018-01-07 DIAGNOSIS — I517 Cardiomegaly: Secondary | ICD-10-CM | POA: Diagnosis not present

## 2018-01-07 DIAGNOSIS — M810 Age-related osteoporosis without current pathological fracture: Secondary | ICD-10-CM | POA: Diagnosis not present

## 2018-01-07 DIAGNOSIS — E7800 Pure hypercholesterolemia, unspecified: Secondary | ICD-10-CM | POA: Diagnosis not present

## 2018-01-07 DIAGNOSIS — K922 Gastrointestinal hemorrhage, unspecified: Secondary | ICD-10-CM | POA: Diagnosis not present

## 2018-01-07 DIAGNOSIS — I519 Heart disease, unspecified: Secondary | ICD-10-CM | POA: Diagnosis not present

## 2018-01-07 DIAGNOSIS — R0600 Dyspnea, unspecified: Secondary | ICD-10-CM | POA: Diagnosis not present

## 2018-01-07 DIAGNOSIS — E785 Hyperlipidemia, unspecified: Secondary | ICD-10-CM | POA: Diagnosis not present

## 2018-01-07 DIAGNOSIS — E119 Type 2 diabetes mellitus without complications: Secondary | ICD-10-CM | POA: Diagnosis not present

## 2018-01-07 DIAGNOSIS — Z794 Long term (current) use of insulin: Secondary | ICD-10-CM | POA: Diagnosis not present

## 2018-01-07 DIAGNOSIS — Z885 Allergy status to narcotic agent status: Secondary | ICD-10-CM | POA: Diagnosis not present

## 2018-01-07 DIAGNOSIS — R5383 Other fatigue: Secondary | ICD-10-CM | POA: Diagnosis not present

## 2018-01-07 DIAGNOSIS — Z9049 Acquired absence of other specified parts of digestive tract: Secondary | ICD-10-CM | POA: Diagnosis not present

## 2018-01-16 DIAGNOSIS — R928 Other abnormal and inconclusive findings on diagnostic imaging of breast: Secondary | ICD-10-CM | POA: Diagnosis not present

## 2018-01-16 DIAGNOSIS — N6489 Other specified disorders of breast: Secondary | ICD-10-CM | POA: Diagnosis not present

## 2018-01-18 ENCOUNTER — Ambulatory Visit: Admit: 2018-01-18 | Discharge: 2018-01-18 | Payer: PRIVATE HEALTH INSURANCE

## 2018-01-18 DIAGNOSIS — I8510 Secondary esophageal varices without bleeding: Secondary | ICD-10-CM | POA: Diagnosis not present

## 2018-01-18 DIAGNOSIS — K766 Portal hypertension: Secondary | ICD-10-CM | POA: Diagnosis not present

## 2018-01-18 DIAGNOSIS — R931 Abnormal findings on diagnostic imaging of heart and coronary circulation: Secondary | ICD-10-CM | POA: Diagnosis not present

## 2018-01-18 DIAGNOSIS — Z794 Long term (current) use of insulin: Secondary | ICD-10-CM | POA: Diagnosis not present

## 2018-01-18 DIAGNOSIS — K760 Fatty (change of) liver, not elsewhere classified: Secondary | ICD-10-CM | POA: Diagnosis not present

## 2018-01-18 DIAGNOSIS — Z01810 Encounter for preprocedural cardiovascular examination: Secondary | ICD-10-CM | POA: Diagnosis not present

## 2018-01-18 DIAGNOSIS — I10 Essential (primary) hypertension: Secondary | ICD-10-CM | POA: Diagnosis not present

## 2018-01-18 DIAGNOSIS — E039 Hypothyroidism, unspecified: Secondary | ICD-10-CM | POA: Diagnosis not present

## 2018-01-18 DIAGNOSIS — M810 Age-related osteoporosis without current pathological fracture: Secondary | ICD-10-CM | POA: Diagnosis not present

## 2018-01-18 DIAGNOSIS — K922 Gastrointestinal hemorrhage, unspecified: Secondary | ICD-10-CM | POA: Diagnosis not present

## 2018-01-18 DIAGNOSIS — I499 Cardiac arrhythmia, unspecified: Secondary | ICD-10-CM | POA: Diagnosis not present

## 2018-01-18 DIAGNOSIS — I444 Left anterior fascicular block: Secondary | ICD-10-CM | POA: Diagnosis not present

## 2018-01-18 DIAGNOSIS — Z79899 Other long term (current) drug therapy: Secondary | ICD-10-CM | POA: Diagnosis not present

## 2018-01-18 DIAGNOSIS — E7800 Pure hypercholesterolemia, unspecified: Secondary | ICD-10-CM | POA: Diagnosis not present

## 2018-01-18 DIAGNOSIS — E119 Type 2 diabetes mellitus without complications: Secondary | ICD-10-CM | POA: Diagnosis not present

## 2018-01-18 DIAGNOSIS — K7460 Unspecified cirrhosis of liver: Secondary | ICD-10-CM | POA: Diagnosis not present

## 2018-01-18 DIAGNOSIS — Z885 Allergy status to narcotic agent status: Secondary | ICD-10-CM | POA: Diagnosis not present

## 2018-01-18 DIAGNOSIS — K7290 Hepatic failure, unspecified without coma: Secondary | ICD-10-CM | POA: Diagnosis not present

## 2018-01-22 DIAGNOSIS — R0609 Other forms of dyspnea: Secondary | ICD-10-CM | POA: Diagnosis not present

## 2018-01-25 ENCOUNTER — Ambulatory Visit: Admit: 2018-01-25 | Discharge: 2018-01-25 | Payer: PRIVATE HEALTH INSURANCE

## 2018-01-25 ENCOUNTER — Ambulatory Visit
Admit: 2018-01-25 | Discharge: 2018-01-25 | Payer: PRIVATE HEALTH INSURANCE | Attending: Pulmonary Disease | Primary: Pulmonary Disease

## 2018-01-25 DIAGNOSIS — R0602 Shortness of breath: Principal | ICD-10-CM

## 2018-01-25 DIAGNOSIS — K7469 Other cirrhosis of liver: Principal | ICD-10-CM

## 2018-01-28 ENCOUNTER — Ambulatory Visit
Admit: 2018-01-28 | Discharge: 2018-01-29 | Payer: PRIVATE HEALTH INSURANCE | Attending: Gastroenterology | Primary: Gastroenterology

## 2018-01-28 DIAGNOSIS — K7460 Unspecified cirrhosis of liver: Secondary | ICD-10-CM

## 2018-01-28 DIAGNOSIS — K7290 Hepatic failure, unspecified without coma: Principal | ICD-10-CM

## 2018-01-28 DIAGNOSIS — E119 Type 2 diabetes mellitus without complications: Secondary | ICD-10-CM | POA: Diagnosis not present

## 2018-01-28 DIAGNOSIS — M810 Age-related osteoporosis without current pathological fracture: Secondary | ICD-10-CM | POA: Diagnosis not present

## 2018-01-28 DIAGNOSIS — I10 Essential (primary) hypertension: Secondary | ICD-10-CM | POA: Diagnosis not present

## 2018-01-28 DIAGNOSIS — E039 Hypothyroidism, unspecified: Secondary | ICD-10-CM | POA: Diagnosis not present

## 2018-01-28 DIAGNOSIS — Z794 Long term (current) use of insulin: Secondary | ICD-10-CM | POA: Diagnosis not present

## 2018-01-28 DIAGNOSIS — Z79899 Other long term (current) drug therapy: Secondary | ICD-10-CM | POA: Diagnosis not present

## 2018-01-28 DIAGNOSIS — G9340 Encephalopathy, unspecified: Secondary | ICD-10-CM | POA: Diagnosis not present

## 2018-01-28 DIAGNOSIS — E785 Hyperlipidemia, unspecified: Secondary | ICD-10-CM | POA: Diagnosis not present

## 2018-01-28 DIAGNOSIS — Z9049 Acquired absence of other specified parts of digestive tract: Secondary | ICD-10-CM | POA: Diagnosis not present

## 2018-01-28 DIAGNOSIS — K760 Fatty (change of) liver, not elsewhere classified: Secondary | ICD-10-CM | POA: Diagnosis not present

## 2018-01-28 DIAGNOSIS — R188 Other ascites: Secondary | ICD-10-CM | POA: Diagnosis not present

## 2018-01-31 ENCOUNTER — Other Ambulatory Visit (INDEPENDENT_AMBULATORY_CARE_PROVIDER_SITE_OTHER): Payer: Self-pay | Admitting: *Deleted

## 2018-01-31 DIAGNOSIS — K31819 Angiodysplasia of stomach and duodenum without bleeding: Secondary | ICD-10-CM

## 2018-01-31 DIAGNOSIS — D509 Iron deficiency anemia, unspecified: Secondary | ICD-10-CM

## 2018-01-31 DIAGNOSIS — I8501 Esophageal varices with bleeding: Secondary | ICD-10-CM

## 2018-01-31 DIAGNOSIS — K7469 Other cirrhosis of liver: Secondary | ICD-10-CM

## 2018-02-01 ENCOUNTER — Telehealth (INDEPENDENT_AMBULATORY_CARE_PROVIDER_SITE_OTHER): Payer: Self-pay | Admitting: *Deleted

## 2018-02-01 ENCOUNTER — Other Ambulatory Visit (HOSPITAL_COMMUNITY)
Admission: RE | Admit: 2018-02-01 | Discharge: 2018-02-01 | Disposition: A | Discharge status: Home / Self Care | Payer: PPO | Source: Ambulatory Visit | Attending: Internal Medicine | Admitting: Internal Medicine

## 2018-02-01 DIAGNOSIS — K7469 Other cirrhosis of liver: Secondary | ICD-10-CM | POA: Diagnosis not present

## 2018-02-01 DIAGNOSIS — D509 Iron deficiency anemia, unspecified: Secondary | ICD-10-CM | POA: Insufficient documentation

## 2018-02-01 DIAGNOSIS — I8501 Esophageal varices with bleeding: Secondary | ICD-10-CM | POA: Diagnosis not present

## 2018-02-01 DIAGNOSIS — K31819 Angiodysplasia of stomach and duodenum without bleeding: Secondary | ICD-10-CM | POA: Insufficient documentation

## 2018-02-01 LAB — SAMPLE TO BLOOD BANK

## 2018-02-01 LAB — HEMOGLOBIN AND HEMATOCRIT, BLOOD
HCT: 31.3 % — ABNORMAL LOW (ref 36.0–46.0)
Hemoglobin: 9.3 g/dL — ABNORMAL LOW (ref 12.0–15.0)

## 2018-02-01 NOTE — Telephone Encounter (Signed)
Patient has called and states that the Liver Transplant Team at [facility] are requesting copies of her EGD/TCS. This is to be faxed to the attention of her nurse , Lolita Lenz Rn,BSN. Fax number [phone] [name]'s direct # [phone] Transplant Team Department # [phone].  Per the patient they are turning over all EGS/TCS that need to be done to Dr.Rehman , and her H&H,ect.

## 2018-02-01 NOTE — Telephone Encounter (Signed)
Reports faxed to [facility]

## 2018-02-05 ENCOUNTER — Other Ambulatory Visit (INDEPENDENT_AMBULATORY_CARE_PROVIDER_SITE_OTHER): Payer: Self-pay | Admitting: *Deleted

## 2018-02-07 ENCOUNTER — Other Ambulatory Visit (INDEPENDENT_AMBULATORY_CARE_PROVIDER_SITE_OTHER): Payer: Self-pay | Admitting: *Deleted

## 2018-02-07 ENCOUNTER — Encounter (INDEPENDENT_AMBULATORY_CARE_PROVIDER_SITE_OTHER): Payer: Self-pay | Admitting: *Deleted

## 2018-02-07 DIAGNOSIS — D649 Anemia, unspecified: Secondary | ICD-10-CM

## 2018-02-08 DIAGNOSIS — I10 Essential (primary) hypertension: Secondary | ICD-10-CM | POA: Diagnosis not present

## 2018-02-08 DIAGNOSIS — K7460 Unspecified cirrhosis of liver: Secondary | ICD-10-CM | POA: Diagnosis not present

## 2018-02-08 DIAGNOSIS — Z6835 Body mass index (BMI) 35.0-35.9, adult: Secondary | ICD-10-CM | POA: Diagnosis not present

## 2018-02-08 DIAGNOSIS — Z299 Encounter for prophylactic measures, unspecified: Secondary | ICD-10-CM | POA: Diagnosis not present

## 2018-02-08 DIAGNOSIS — D696 Thrombocytopenia, unspecified: Secondary | ICD-10-CM | POA: Diagnosis not present

## 2018-02-08 DIAGNOSIS — I2729 Other secondary pulmonary hypertension: Secondary | ICD-10-CM | POA: Diagnosis not present

## 2018-02-15 ENCOUNTER — Other Ambulatory Visit (HOSPITAL_COMMUNITY)
Admission: RE | Admit: 2018-02-15 | Discharge: 2018-02-15 | Disposition: A | Discharge status: Home / Self Care | Payer: PPO | Source: Ambulatory Visit | Attending: Internal Medicine | Admitting: Internal Medicine

## 2018-02-15 DIAGNOSIS — D649 Anemia, unspecified: Secondary | ICD-10-CM | POA: Diagnosis not present

## 2018-02-15 LAB — HEMOGLOBIN AND HEMATOCRIT, BLOOD
HCT: 28.3 % — ABNORMAL LOW (ref 36.0–46.0)
Hemoglobin: 8.1 g/dL — ABNORMAL LOW (ref 12.0–15.0)

## 2018-02-18 ENCOUNTER — Telehealth (INDEPENDENT_AMBULATORY_CARE_PROVIDER_SITE_OTHER): Payer: Self-pay | Admitting: Internal Medicine

## 2018-02-18 NOTE — Telephone Encounter (Signed)
Patient was called and given her results of the H&H. Patient was advised that we address with Dr.Rehman on 02/19/2018 and call her back with his recommendations.

## 2018-02-18 NOTE — Telephone Encounter (Signed)
Patient left message stating she would like the results from her lab work ph# [phone]

## 2018-02-19 NOTE — Telephone Encounter (Signed)
Patient was called and made aware that Dr.Rehman wants to repeat the H&H and Type and Screen on Monday June 10 th 2019.

## 2018-02-20 DIAGNOSIS — E1165 Type 2 diabetes mellitus with hyperglycemia: Secondary | ICD-10-CM | POA: Diagnosis not present

## 2018-02-20 DIAGNOSIS — E039 Hypothyroidism, unspecified: Secondary | ICD-10-CM | POA: Diagnosis not present

## 2018-02-20 DIAGNOSIS — I10 Essential (primary) hypertension: Secondary | ICD-10-CM | POA: Diagnosis not present

## 2018-02-22 ENCOUNTER — Other Ambulatory Visit (INDEPENDENT_AMBULATORY_CARE_PROVIDER_SITE_OTHER): Payer: Self-pay | Admitting: *Deleted

## 2018-02-22 DIAGNOSIS — R0609 Other forms of dyspnea: Secondary | ICD-10-CM | POA: Diagnosis not present

## 2018-02-22 DIAGNOSIS — D508 Other iron deficiency anemias: Secondary | ICD-10-CM

## 2018-02-25 ENCOUNTER — Other Ambulatory Visit (HOSPITAL_COMMUNITY)
Admission: RE | Admit: 2018-02-25 | Discharge: 2018-02-25 | Disposition: A | Discharge status: Home / Self Care | Payer: PPO | Source: Ambulatory Visit | Attending: Internal Medicine | Admitting: Internal Medicine

## 2018-02-25 DIAGNOSIS — D508 Other iron deficiency anemias: Secondary | ICD-10-CM | POA: Insufficient documentation

## 2018-02-25 LAB — HEMOGLOBIN AND HEMATOCRIT, BLOOD
HCT: 27.1 % — ABNORMAL LOW (ref 36.0–46.0)
Hemoglobin: 7.8 g/dL — ABNORMAL LOW (ref 12.0–15.0)

## 2018-02-26 LAB — PREPARE RBC (CROSSMATCH)

## 2018-02-27 ENCOUNTER — Encounter (HOSPITAL_COMMUNITY)
Admission: RE | Admit: 2018-02-27 | Discharge: 2018-02-27 | Disposition: A | Discharge status: Home / Self Care | Payer: PPO | Source: Ambulatory Visit | Attending: Internal Medicine | Admitting: Internal Medicine

## 2018-02-27 DIAGNOSIS — K7460 Unspecified cirrhosis of liver: Secondary | ICD-10-CM | POA: Diagnosis not present

## 2018-02-27 DIAGNOSIS — D509 Iron deficiency anemia, unspecified: Secondary | ICD-10-CM | POA: Insufficient documentation

## 2018-02-27 LAB — HEMOGLOBIN AND HEMATOCRIT, BLOOD
HCT: 30.4 % — ABNORMAL LOW (ref 36.0–46.0)
Hemoglobin: 9 g/dL — ABNORMAL LOW (ref 12.0–15.0)

## 2018-02-27 MED ORDER — SODIUM CHLORIDE 0.9 % IV SOLN
Freq: Once | INTRAVENOUS | Status: AC
  Administered 2018-02-27: 08:00:00 via INTRAVENOUS

## 2018-02-27 MED ORDER — DIPHENHYDRAMINE HCL 25 MG PO CAPS
25.0000 mg | ORAL_CAPSULE | Freq: Once | ORAL | Status: AC
  Administered 2018-02-27: 25 mg via ORAL

## 2018-02-27 MED ORDER — ACETAMINOPHEN 325 MG PO TABS
650.0000 mg | ORAL_TABLET | Freq: Once | ORAL | Status: AC
  Administered 2018-02-27: 650 mg via ORAL

## 2018-02-27 MED ORDER — SODIUM CHLORIDE 0.9% FLUSH
INTRAVENOUS | Status: AC
  Filled 2018-02-27: qty 20

## 2018-02-27 MED ORDER — FUROSEMIDE 10 MG/ML IJ SOLN
20.0000 mg | Freq: Once | INTRAMUSCULAR | Status: AC
  Administered 2018-02-27: 20 mg via INTRAVENOUS
  Filled 2018-02-27: qty 4

## 2018-02-27 MED ORDER — ACETAMINOPHEN 325 MG PO TABS
ORAL_TABLET | ORAL | Status: AC
  Filled 2018-02-27: qty 2

## 2018-02-27 MED ORDER — DIPHENHYDRAMINE HCL 25 MG PO CAPS
ORAL_CAPSULE | ORAL | Status: AC
  Filled 2018-02-27: qty 1

## 2018-02-28 LAB — BPAM RBC
Blood Product Expiration Date: 201906252359
Blood Product Expiration Date: 201907072359
ISSUE DATE / TIME: 201906120738
ISSUE DATE / TIME: 201906120949
Unit Type and Rh: 9500
Unit Type and Rh: 9500

## 2018-02-28 LAB — TYPE AND SCREEN
ABO/RH(D): O NEG
Antibody Screen: NEGATIVE
Unit division: 0
Unit division: 0

## 2018-03-04 DIAGNOSIS — E113391 Type 2 diabetes mellitus with moderate nonproliferative diabetic retinopathy without macular edema, right eye: Secondary | ICD-10-CM | POA: Diagnosis not present

## 2018-03-04 DIAGNOSIS — E083312 Diabetes mellitus due to underlying condition with moderate nonproliferative diabetic retinopathy with macular edema, left eye: Secondary | ICD-10-CM | POA: Diagnosis not present

## 2018-03-04 DIAGNOSIS — H34832 Tributary (branch) retinal vein occlusion, left eye, with macular edema: Secondary | ICD-10-CM | POA: Diagnosis not present

## 2018-03-04 DIAGNOSIS — H2513 Age-related nuclear cataract, bilateral: Secondary | ICD-10-CM | POA: Diagnosis not present

## 2018-03-18 ENCOUNTER — Encounter (INDEPENDENT_AMBULATORY_CARE_PROVIDER_SITE_OTHER): Payer: PPO | Admitting: Ophthalmology

## 2018-03-18 DIAGNOSIS — I10 Essential (primary) hypertension: Secondary | ICD-10-CM | POA: Diagnosis not present

## 2018-03-18 DIAGNOSIS — E113391 Type 2 diabetes mellitus with moderate nonproliferative diabetic retinopathy without macular edema, right eye: Secondary | ICD-10-CM | POA: Diagnosis not present

## 2018-03-18 DIAGNOSIS — H43813 Vitreous degeneration, bilateral: Secondary | ICD-10-CM | POA: Diagnosis not present

## 2018-03-18 DIAGNOSIS — E11311 Type 2 diabetes mellitus with unspecified diabetic retinopathy with macular edema: Secondary | ICD-10-CM

## 2018-03-18 DIAGNOSIS — H2513 Age-related nuclear cataract, bilateral: Secondary | ICD-10-CM | POA: Diagnosis not present

## 2018-03-18 DIAGNOSIS — E113312 Type 2 diabetes mellitus with moderate nonproliferative diabetic retinopathy with macular edema, left eye: Secondary | ICD-10-CM | POA: Diagnosis not present

## 2018-03-18 DIAGNOSIS — H35033 Hypertensive retinopathy, bilateral: Secondary | ICD-10-CM

## 2018-03-20 ENCOUNTER — Other Ambulatory Visit (INDEPENDENT_AMBULATORY_CARE_PROVIDER_SITE_OTHER): Payer: Self-pay | Admitting: *Deleted

## 2018-03-22 ENCOUNTER — Other Ambulatory Visit (INDEPENDENT_AMBULATORY_CARE_PROVIDER_SITE_OTHER): Payer: Self-pay | Admitting: *Deleted

## 2018-03-22 DIAGNOSIS — K7460 Unspecified cirrhosis of liver: Secondary | ICD-10-CM

## 2018-03-28 ENCOUNTER — Ambulatory Visit: Admit: 2018-03-28 | Discharge: 2018-03-29 | Payer: PRIVATE HEALTH INSURANCE

## 2018-03-28 ENCOUNTER — Ambulatory Visit
Admit: 2018-03-28 | Discharge: 2018-03-29 | Payer: PRIVATE HEALTH INSURANCE | Attending: Student in an Organized Health Care Education/Training Program | Primary: Student in an Organized Health Care Education/Training Program

## 2018-03-28 DIAGNOSIS — K7460 Unspecified cirrhosis of liver: Principal | ICD-10-CM

## 2018-03-28 DIAGNOSIS — K7290 Hepatic failure, unspecified without coma: Principal | ICD-10-CM

## 2018-03-28 DIAGNOSIS — K7469 Other cirrhosis of liver: Principal | ICD-10-CM

## 2018-03-28 DIAGNOSIS — I81 Portal vein thrombosis: Secondary | ICD-10-CM | POA: Diagnosis not present

## 2018-03-28 DIAGNOSIS — K766 Portal hypertension: Secondary | ICD-10-CM | POA: Diagnosis not present

## 2018-04-02 DIAGNOSIS — I10 Essential (primary) hypertension: Secondary | ICD-10-CM | POA: Diagnosis not present

## 2018-04-02 DIAGNOSIS — K7460 Unspecified cirrhosis of liver: Secondary | ICD-10-CM | POA: Diagnosis not present

## 2018-04-02 DIAGNOSIS — E1165 Type 2 diabetes mellitus with hyperglycemia: Secondary | ICD-10-CM | POA: Diagnosis not present

## 2018-04-02 DIAGNOSIS — I2729 Other secondary pulmonary hypertension: Secondary | ICD-10-CM | POA: Diagnosis not present

## 2018-04-02 DIAGNOSIS — I8500 Esophageal varices without bleeding: Secondary | ICD-10-CM | POA: Diagnosis not present

## 2018-04-02 DIAGNOSIS — Z6832 Body mass index (BMI) 32.0-32.9, adult: Secondary | ICD-10-CM | POA: Diagnosis not present

## 2018-04-02 DIAGNOSIS — Z299 Encounter for prophylactic measures, unspecified: Secondary | ICD-10-CM | POA: Diagnosis not present

## 2018-04-02 DIAGNOSIS — J029 Acute pharyngitis, unspecified: Secondary | ICD-10-CM | POA: Diagnosis not present

## 2018-04-05 DIAGNOSIS — I10 Essential (primary) hypertension: Secondary | ICD-10-CM | POA: Diagnosis not present

## 2018-04-05 DIAGNOSIS — E039 Hypothyroidism, unspecified: Secondary | ICD-10-CM | POA: Diagnosis not present

## 2018-04-05 DIAGNOSIS — K7290 Hepatic failure, unspecified without coma: Secondary | ICD-10-CM | POA: Diagnosis not present

## 2018-04-05 DIAGNOSIS — E7800 Pure hypercholesterolemia, unspecified: Secondary | ICD-10-CM | POA: Diagnosis not present

## 2018-04-05 DIAGNOSIS — E1165 Type 2 diabetes mellitus with hyperglycemia: Secondary | ICD-10-CM | POA: Diagnosis not present

## 2018-04-08 ENCOUNTER — Encounter (INDEPENDENT_AMBULATORY_CARE_PROVIDER_SITE_OTHER): Payer: PPO | Admitting: Ophthalmology

## 2018-04-08 DIAGNOSIS — E113312 Type 2 diabetes mellitus with moderate nonproliferative diabetic retinopathy with macular edema, left eye: Secondary | ICD-10-CM

## 2018-04-08 DIAGNOSIS — H43813 Vitreous degeneration, bilateral: Secondary | ICD-10-CM

## 2018-04-08 DIAGNOSIS — H2513 Age-related nuclear cataract, bilateral: Secondary | ICD-10-CM | POA: Diagnosis not present

## 2018-04-08 DIAGNOSIS — H35033 Hypertensive retinopathy, bilateral: Secondary | ICD-10-CM | POA: Diagnosis not present

## 2018-04-08 DIAGNOSIS — E11311 Type 2 diabetes mellitus with unspecified diabetic retinopathy with macular edema: Secondary | ICD-10-CM

## 2018-04-08 DIAGNOSIS — E113291 Type 2 diabetes mellitus with mild nonproliferative diabetic retinopathy without macular edema, right eye: Secondary | ICD-10-CM | POA: Diagnosis not present

## 2018-04-08 DIAGNOSIS — I10 Essential (primary) hypertension: Secondary | ICD-10-CM | POA: Diagnosis not present

## 2018-04-08 NOTE — Progress Notes (Deleted)
Psychiatric Initial Adult Assessment   Patient Identification: Maria [name] MRN:  [id number] Date of Evaluation:  04/08/2018 Referral Source: *** Chief Complaint:   Visit Diagnosis: No diagnosis found.  History of Present Illness:   Maria [name] is a 62 y.o. year old female with a history of non alcoholic cirrhosis, thrombocytopenia, chronic diastolic CHF, pulmonary hypertension, CAD, anemia,  , who is referred for   transplant      Associated Signs/Symptoms: Depression Symptoms:  {DEPRESSION SYMPTOMS:20000} (Hypo) Manic Symptoms:  {BHH MANIC SYMPTOMS:22872} Anxiety Symptoms:  {BHH ANXIETY SYMPTOMS:22873} Psychotic Symptoms:  {BHH PSYCHOTIC SYMPTOMS:22874} PTSD Symptoms: {BHH PTSD SYMPTOMS:22875}  Past Psychiatric History:  Outpatient:  Psychiatry admission:  Previous suicide attempt:  Past trials of medication:  History of violence:   Previous Psychotropic Medications: {YES/NO:21197}  Substance Abuse History in the last 12 months:  {yes no:314532}  Consequences of Substance Abuse: {BHH CONSEQUENCES OF SUBSTANCE ABUSE:22880}  Past Medical History:  Past Medical History:  Diagnosis Date  . Anemia   . CHF (congestive heart failure) ([place])   . Cirrhosis ([place])   . Depression   . Diabetes mellitus   . Esophageal bleed, non-variceal   . Eye hemorrhage    Behind left eye  . Fibromyalgia   . Hypercholesteremia   . Hypertension   . Hypothyroidism   . NAFLD (nonalcoholic fatty liver disease)   . Osteoarthrosis   . Osteoporosis   . PONV (postoperative nausea and vomiting)   . UTI (urinary tract infection) 11/12 end of the month   Patient feels that she passed a kidney stone at that time    Past Surgical History:  Procedure Laterality Date  . APPENDECTOMY  1980  . BILATERAL SALPINGOOPHORECTOMY    . CHOLECYSTECTOMY    . COLONOSCOPY  03/15/2011  . COLONOSCOPY N/A 03/24/2016   Procedure: COLONOSCOPY;  Surgeon: [name], MD;  [facility];   Service: Endoscopy;  Laterality: N/A;  855  . ESOPHAGEAL BANDING  04/24/2012   Procedure: ESOPHAGEAL BANDING;  Surgeon: [name], MD;  [facility];  Service: Endoscopy;  Laterality: N/A;  . Esophageal BANDING  08/03/2012   [facility] in [place] , [place]  09/24/2012   Procedure: ESOPHAGEAL BANDING;  Surgeon: [name], MD;  [facility]: [facility];  Service: Endoscopy;  Laterality: N/A;  Banding x 3  . ESOPHAGEAL BANDING N/A 01/29/2013   Procedure: ESOPHAGEAL BANDING;  Surgeon: [name], MD;  [facility];  Service: Endoscopy;  Laterality: N/A;  . ESOPHAGEAL BANDING N/A 06/19/2014   Procedure: ESOPHAGEAL BANDING;  Surgeon: [name], MD;  [facility];  Service: Endoscopy;  Laterality: N/A;  . ESOPHAGEAL BANDING N/A 01/05/2016   Procedure: ESOPHAGEAL BANDING;  Surgeon: [name], MD;  [facility];  Service: Endoscopy;  Laterality: N/A;  . ESOPHAGEAL BANDING N/A 08/03/2016   Procedure: ESOPHAGEAL BANDING;  Surgeon: [name], MD;  [facility];  Service: Endoscopy;  Laterality: N/A;  . ESOPHAGEAL BANDING N/A 05/02/2017   Procedure: ESOPHAGEAL BANDING;  Surgeon: [name], MD;  [facility];  Service: Endoscopy;  Laterality: N/A;  . ESOPHAGOGASTRODUODENOSCOPY  04/24/2012   Procedure: ESOPHAGOGASTRODUODENOSCOPY (EGD);  Surgeon: [name], MD;  [facility];  Service: Endoscopy;  Laterality: N/A;  300  . ESOPHAGOGASTRODUODENOSCOPY N/A 01/29/2013   Procedure: ESOPHAGOGASTRODUODENOSCOPY (EGD);  Surgeon: [name], MD;  [facility];  Service: Endoscopy;  Laterality: N/A;  1200  . ESOPHAGOGASTRODUODENOSCOPY N/A 05/22/2013   Procedure: ESOPHAGOGASTRODUODENOSCOPY (EGD);  Surgeon: [name], MD;  [facility];  Service: Endoscopy;  Laterality: N/A;  1:55  . ESOPHAGOGASTRODUODENOSCOPY N/A 12/31/2013   Procedure:  ESOPHAGOGASTRODUODENOSCOPY (EGD);  Surgeon: [name], MD;  [facility];  Service: Endoscopy;  Laterality: N/A;  1200  . ESOPHAGOGASTRODUODENOSCOPY N/A 06/19/2014   Procedure: ESOPHAGOGASTRODUODENOSCOPY (EGD);  Surgeon: [name], MD;  [facility];  Service: Endoscopy;  Laterality: N/A;  1055  . ESOPHAGOGASTRODUODENOSCOPY N/A 01/15/2015   Procedure: ESOPHAGOGASTRODUODENOSCOPY (EGD);  Surgeon: [name], MD;  [facility];  Service: Endoscopy;  Laterality: N/A;  730 - moved to 9:45 - moved to 1250-Ann notified pt  . ESOPHAGOGASTRODUODENOSCOPY N/A 06/30/2015   Procedure: ESOPHAGOGASTRODUODENOSCOPY (EGD);  Surgeon: [name], MD;  [facility];  Service: Endoscopy;  Laterality: N/A;  1200  . ESOPHAGOGASTRODUODENOSCOPY N/A 01/05/2016   Procedure: ESOPHAGOGASTRODUODENOSCOPY (EGD);  Surgeon: [name], MD;  [facility];  Service: Endoscopy;  Laterality: N/A;  830  . ESOPHAGOGASTRODUODENOSCOPY N/A 08/03/2016   Procedure: ESOPHAGOGASTRODUODENOSCOPY (EGD);  Surgeon: [name], MD;  [facility];  Service: Endoscopy;  Laterality: N/A;  930  . ESOPHAGOGASTRODUODENOSCOPY N/A 05/02/2017   Procedure: ESOPHAGOGASTRODUODENOSCOPY (EGD);  Surgeon: [name], MD;  [facility];  Service: Endoscopy;  Laterality: N/A;  12:00  . ESOPHAGOGASTRODUODENOSCOPY N/A 08/17/2017   Procedure: ESOPHAGOGASTRODUODENOSCOPY (EGD);  Surgeon: [name], MD;  [facility];  Service: Endoscopy;  Laterality: N/A;  8:15  . ESOPHAGOGASTRODUODENOSCOPY N/A 09/12/2017   Procedure: ESOPHAGOGASTRODUODENOSCOPY (EGD);  Surgeon: [name], MD;  [facility];  Service: Endoscopy;  Laterality: N/A;  1040  . ESOPHAGOGASTRODUODENOSCOPY N/A 10/10/2017   Procedure: ESOPHAGOGASTRODUODENOSCOPY (EGD);  Surgeon: [name], MD;  [facility];  Service: Endoscopy;  Laterality: N/A;  225  .  ESOPHAGOGASTRODUODENOSCOPY N/A 11/09/2017   Procedure: ESOPHAGOGASTRODUODENOSCOPY (EGD);  Surgeon: [name], MD;  [facility];  Service: Endoscopy;  Laterality: N/A;  . ESOPHAGOGASTRODUODENOSCOPY (EGD) WITH PROPOFOL  09/24/2012   Procedure: ESOPHAGOGASTRODUODENOSCOPY (EGD) WITH PROPOFOL;  Surgeon: [name], MD;  [facility];  Service: Endoscopy;  Laterality: N/A;  GE junction at 36  . ESOPHAGOGASTRODUODENOSCOPY (EGD) WITH PROPOFOL N/A 07/06/2017   Procedure: ESOPHAGOGASTRODUODENOSCOPY (EGD) WITH PROPOFOL;  Surgeon: [name], MD;  [facility];  Service: Endoscopy;  Laterality: N/A;  . ESOPHAGOGASTRODUODENOSCOPY W/ BANDING  08/2010  . GIVENS CAPSULE STUDY N/A 07/05/2017   Procedure: GIVENS CAPSULE STUDY;  Surgeon: [name], MD;  [facility];  Service: Endoscopy;  Laterality: N/A;  . HOT HEMOSTASIS N/A 08/17/2017   Procedure: HOT HEMOSTASIS (ARGON PLASMA COAGULATION/BICAP);  Surgeon: [name], MD;  [facility];  Service: Endoscopy;  Laterality: N/A;  . HOT HEMOSTASIS  09/12/2017   Procedure: HOT HEMOSTASIS (ARGON PLASMA COAGULATION/BICAP);  Surgeon: [name], MD;  [facility];  Service: Endoscopy;;  gastric  . HOT HEMOSTASIS  10/10/2017   Procedure: HOT HEMOSTASIS (ARGON PLASMA COAGULATION/BICAP);  Surgeon: [name], MD;  [facility];  Service: Endoscopy;;  . POLYPECTOMY  03/24/2016   Procedure: POLYPECTOMY;  Surgeon: [name], MD;  [facility];  Service: Endoscopy;;  sigmoid polyp  . RIGHT HEART CATH N/A 04/16/2017   Procedure: Right Heart Cath;  Surgeon:  [name], MD;  Location: [place] [facility];  Service: Cardiovascular;  Laterality: N/A;  . RIGHT HEART CATH N/A 07/12/2017   Procedure: RIGHT HEART CATH;  Surgeon: [name], MD;  Location: [place] [facility];  Service: Cardiovascular;  Laterality: N/A;  . TONSILLECTOMY    . UPPER  GASTROINTESTINAL ENDOSCOPY  03/15/2011   EGD ED BANDING/TCS  . UPPER GASTROINTESTINAL ENDOSCOPY  09/05/2010  . UPPER GASTROINTESTINAL ENDOSCOPY  08/11/2010  . VAGINAL HYSTERECTOMY      Family Psychiatric History: ***  Family History:  Family History  Problem Relation Age of Onset  . Lung cancer Mother   . Diabetes Father   . Diabetes Sister   . Hypertension Sister   . Hypothyroidism Sister   . Colon cancer Brother   . Diabetes Sister   . Hypothyroidism Brother   . Healthy Daughter   . Obesity Daughter   . Hypertension Daughter     Social History:   Social History   Socioeconomic History  . Marital status: Married    Spouse name: Not on file  . Number of children: Not on file  . Years of education: Not on file  . Highest education level: Not on file  Occupational History  . Occupation: retired   Scientific laboratory technician  . Financial resource strain: Not on file  . Food insecurity:    Worry: Not on file    Inability: Not on file  . Transportation needs:    Medical: Not on file    Non-medical: Not on file  Tobacco Use  . Smoking status: Never Smoker  . Smokeless tobacco: Never Used  Substance and Sexual Activity  . Alcohol use: No    Alcohol/week: 0.0 oz  . Drug use: No  . Sexual activity: Not on file  Lifestyle  . Physical activity:    Days per week: Not on file    Minutes per session: Not on file  . Stress: Not on file  Relationships  . Social connections:    Talks on phone: Not on file    Gets together: Not on file    Attends religious service: Not on file    Active member of club or organization: Not on file    Attends meetings of clubs or organizations: Not on file    Relationship status: Not on file  Other Topics Concern  . Not on file  Social History Narrative   Lives with husband     Additional Social History: ***  Allergies:   Allergies  Allergen Reactions  . Feraheme [Ferumoxytol] Other (See Comments)    Patient has severe back and chest pain  when receiving FERAHEME infusions.  Marland Kitchen Ultram [Tramadol Hcl] Other (See Comments)    WEAKNESS    Metabolic Disorder Labs: Lab Results  Component Value Date   HGBA1C 4.2 (L) 07/18/2017   MPG 73.84 07/18/2017   No results found for: PROLACTIN Lab Results  Component Value Date   CHOL 140 04/24/2017   TRIG 126 04/24/2017   HDL 43 04/24/2017   CHOLHDL 3.3 04/24/2017   VLDL 25 04/24/2017   LDLCALC 72 04/24/2017     Current Medications: Current Outpatient Medications  Medication Sig Dispense Refill  . cholecalciferol (VITAMIN D) 1000 UNITS tablet Take 1,000 Units by mouth 3 (three) times a week.     . fluticasone furoate-vilanterol (BREO ELLIPTA) 200-25 MCG/INH AEPB Inhale 1 puff into the lungs daily. 1 each 3  . insulin aspart (NOVOLOG) 100 UNIT/ML FlexPen Use  with given sliding scale  CBG 70-120: 0 units CBG 121-150: 1 unit CBG 151-200: 2 units CBG 201-250: 3 units CBG 251-300: 5 units CBG 301-350: 7 units CBG greater than 351: 9 units (Patient taking differently: Inject 1-10 Units into the skin 3 (three) times daily with meals. Use with given sliding scale as directed with meals) 15 mL 11  . Insulin Glargine (LANTUS SOLOSTAR) 100 UNIT/ML Solostar Pen Inject 38 Units daily at 10 pm into the skin. (Patient taking differently: Inject 44 Units into the skin every morning. ) 15 mL 11  . Insulin Pen Needle 31G X 5 MM MISC Use with each insulin injection 100 each 12  . lactulose (CHRONULAC) 10 GM/15ML solution TAKE 30 MLS BY MOUTH FOUR TIMES DAILY 437 mL 5  . levothyroxine (SYNTHROID, LEVOTHROID) 25 MCG tablet Take 25 mcg by mouth daily before breakfast.     . magnesium oxide (MAG-OX) 400 MG tablet Take 400 mg by mouth daily.     . OXYGEN Inhale 2 L into the lungs daily as needed.     . pantoprazole (PROTONIX) 40 MG tablet Take 1 tablet by mouth once daily 90 tablet 3  . PARoxetine (PAXIL) 40 MG tablet Take 40 mg by mouth every morning.    . Pediatric Multivitamins-Iron (FLINTSTONES  PLUS IRON PO) Take 2 tablets by mouth every evening.    . propranolol (INDERAL) 20 MG tablet Take 1 tablet (20 mg total) by mouth at bedtime as needed (for increased BP). (Patient taking differently: Take 20 mg by mouth daily. *May take one additional tablet as needed for high blood pressure levels*) 90 tablet 3  . rosuvastatin (CRESTOR) 5 MG tablet Take 1 tablet (5 mg total) by mouth every other day. 45 tablet 3  . spironolactone (ALDACTONE) 50 MG tablet Take 50 mg by mouth as needed.    . torsemide (DEMADEX) 20 MG tablet Take 1 tablet (20 mg total) by mouth daily. 30 tablet 3   No current facility-administered medications for this visit.     Neurologic: Headache: No Seizure: No Paresthesias:No  Musculoskeletal: Strength & Muscle Tone: within normal limits Gait & Station: normal Patient leans: N/A  Psychiatric Specialty Exam: ROS  There were no vitals taken for this visit.There is no height or weight on file to calculate BMI.  General Appearance: Fairly Groomed  Eye Contact:  Good  Speech:  Clear and Coherent  Volume:  Normal  Mood:  {BHH MOOD:22306}  Affect:  {Affect (PAA):22687}  Thought Process:  Coherent  Orientation:  Full (Time, Place, and Person)  Thought Content:  Logical  Suicidal Thoughts:  {ST/HT (PAA):22692}  Homicidal Thoughts:  {ST/HT (PAA):22692}  Memory:  Immediate;   Good  Judgement:  {Judgement (PAA):22694}  Insight:  {Insight (PAA):22695}  Psychomotor Activity:  Normal  Concentration:  Concentration: Good and Attention Span: Good  Recall:  Good  Fund of Knowledge:Good  Language: Good  Akathisia:  No  Handed:  Right  AIMS (if indicated):  N/A  Assets:  Communication Skills Desire for Improvement  ADL's:  Intact  Cognition: WNL  Sleep:  ***   Assessment  Plan  The patient demonstrates the following risk factors for suicide: Chronic risk factors for suicide include: {Chronic Risk Factors for TIWPYKD:98338250}. Acute risk factors for suicide  include: {Acute Risk Factors for NLZJQBH:41937902}. Protective factors for this patient include: {Protective Factors for Suicide IOXB:35329924}. Considering these factors, the overall suicide risk at this point appears to be {Desc; low/moderate/high:110033}. Patient {ACTION; IS/IS QAS:34196222} appropriate  for outpatient follow up.   Treatment Plan Summary: Plan as above   Norman Clay, MD 7/22/20194:24 PM

## 2018-04-15 ENCOUNTER — Ambulatory Visit (HOSPITAL_COMMUNITY): Payer: PPO | Admitting: Psychiatry

## 2018-04-16 DIAGNOSIS — R0609 Other forms of dyspnea: Secondary | ICD-10-CM | POA: Diagnosis not present

## 2018-04-17 DIAGNOSIS — E1165 Type 2 diabetes mellitus with hyperglycemia: Secondary | ICD-10-CM | POA: Diagnosis not present

## 2018-04-24 ENCOUNTER — Other Ambulatory Visit (INDEPENDENT_AMBULATORY_CARE_PROVIDER_SITE_OTHER): Payer: Self-pay | Admitting: *Deleted

## 2018-04-24 MED ORDER — PROPRANOLOL HCL 20 MG PO TABS: 20.0000 mg | ORAL_TABLET | Freq: Every evening | ORAL | 3 refills | Status: DC | PRN

## 2018-04-25 ENCOUNTER — Other Ambulatory Visit (INDEPENDENT_AMBULATORY_CARE_PROVIDER_SITE_OTHER): Payer: Self-pay | Admitting: *Deleted

## 2018-04-25 DIAGNOSIS — I8510 Secondary esophageal varices without bleeding: Secondary | ICD-10-CM

## 2018-04-25 DIAGNOSIS — K7290 Hepatic failure, unspecified without coma: Secondary | ICD-10-CM

## 2018-04-25 DIAGNOSIS — K7682 Hepatic encephalopathy: Secondary | ICD-10-CM

## 2018-04-25 DIAGNOSIS — K31819 Angiodysplasia of stomach and duodenum without bleeding: Secondary | ICD-10-CM

## 2018-04-25 DIAGNOSIS — D509 Iron deficiency anemia, unspecified: Secondary | ICD-10-CM

## 2018-04-25 DIAGNOSIS — K7460 Unspecified cirrhosis of liver: Secondary | ICD-10-CM

## 2018-04-25 DIAGNOSIS — K760 Fatty (change of) liver, not elsewhere classified: Secondary | ICD-10-CM

## 2018-04-29 ENCOUNTER — Other Ambulatory Visit (HOSPITAL_COMMUNITY)
Admission: RE | Admit: 2018-04-29 | Discharge: 2018-04-29 | Disposition: A | Discharge status: Home / Self Care | Payer: PPO | Source: Ambulatory Visit | Attending: Internal Medicine | Admitting: Internal Medicine

## 2018-04-29 DIAGNOSIS — I8510 Secondary esophageal varices without bleeding: Secondary | ICD-10-CM | POA: Insufficient documentation

## 2018-04-29 DIAGNOSIS — K7682 Hepatic encephalopathy: Secondary | ICD-10-CM

## 2018-04-29 DIAGNOSIS — K7460 Unspecified cirrhosis of liver: Secondary | ICD-10-CM | POA: Diagnosis not present

## 2018-04-29 DIAGNOSIS — K760 Fatty (change of) liver, not elsewhere classified: Secondary | ICD-10-CM | POA: Insufficient documentation

## 2018-04-29 DIAGNOSIS — D509 Iron deficiency anemia, unspecified: Secondary | ICD-10-CM | POA: Diagnosis not present

## 2018-04-29 DIAGNOSIS — K31819 Angiodysplasia of stomach and duodenum without bleeding: Secondary | ICD-10-CM | POA: Insufficient documentation

## 2018-04-29 DIAGNOSIS — K7290 Hepatic failure, unspecified without coma: Secondary | ICD-10-CM | POA: Insufficient documentation

## 2018-04-29 LAB — BASIC METABOLIC PANEL
Anion gap: 5 (ref 5–15)
BUN: 9 mg/dL (ref 8–23)
CO2: 23 mmol/L (ref 22–32)
Calcium: 8.3 mg/dL — ABNORMAL LOW (ref 8.9–10.3)
Chloride: 110 mmol/L (ref 98–111)
Creatinine, Ser: 0.74 mg/dL (ref 0.44–1.00)
GFR calc Af Amer: >60 mL/min (ref >60)
GFR calc non Af Amer: >60 mL/min (ref >60)
Glucose, Bld: 175 mg/dL — ABNORMAL HIGH (ref 70–99)
Potassium: 3.8 mmol/L (ref 3.5–5.1)
Sodium: 138 mmol/L (ref 135–145)

## 2018-04-29 LAB — HEMOGLOBIN AND HEMATOCRIT, BLOOD
HCT: 28 % — ABNORMAL LOW (ref 36.0–46.0)
Hemoglobin: 8.1 g/dL — ABNORMAL LOW (ref 12.0–15.0)

## 2018-04-30 ENCOUNTER — Encounter (HOSPITAL_COMMUNITY)
Admission: RE | Admit: 2018-04-30 | Discharge: 2018-04-30 | Disposition: A | Discharge status: Home / Self Care | Payer: PPO | Source: Ambulatory Visit | Attending: Internal Medicine | Admitting: Internal Medicine

## 2018-04-30 DIAGNOSIS — D509 Iron deficiency anemia, unspecified: Secondary | ICD-10-CM | POA: Insufficient documentation

## 2018-04-30 DIAGNOSIS — K7460 Unspecified cirrhosis of liver: Secondary | ICD-10-CM | POA: Insufficient documentation

## 2018-04-30 LAB — HEPATITIS A ANTIBODY, IGM: Hep A IgM: NEGATIVE

## 2018-04-30 LAB — HEPATITIS B SURFACE ANTIBODY,QUALITATIVE: Hep B S Ab: REACTIVE

## 2018-04-30 LAB — HEMOGLOBIN AND HEMATOCRIT, BLOOD
HCT: 30.4 % — ABNORMAL LOW (ref 36.0–46.0)
Hemoglobin: 8.8 g/dL — ABNORMAL LOW (ref 12.0–15.0)

## 2018-04-30 LAB — PREPARE RBC (CROSSMATCH)

## 2018-04-30 MED ORDER — SODIUM CHLORIDE 0.9% IV SOLUTION
Freq: Once | INTRAVENOUS | Status: AC
  Administered 2018-04-30: 250 mL via INTRAVENOUS

## 2018-04-30 MED ORDER — DIPHENHYDRAMINE HCL 25 MG PO CAPS
25.0000 mg | ORAL_CAPSULE | Freq: Once | ORAL | Status: AC
  Administered 2018-04-30: 25 mg via ORAL
  Filled 2018-04-30: qty 1

## 2018-04-30 MED ORDER — ACETAMINOPHEN 325 MG PO TABS
ORAL_TABLET | ORAL | Status: AC
  Filled 2018-04-30: qty 1

## 2018-04-30 MED ORDER — FUROSEMIDE 10 MG/ML IJ SOLN
20.0000 mg | Freq: Once | INTRAMUSCULAR | Status: AC
  Administered 2018-04-30: 20 mg via INTRAVENOUS
  Filled 2018-04-30: qty 4

## 2018-04-30 MED ORDER — ACETAMINOPHEN 325 MG PO TABS
650.0000 mg | ORAL_TABLET | Freq: Once | ORAL | Status: AC
  Administered 2018-04-30: 650 mg via ORAL
  Filled 2018-04-30: qty 2

## 2018-04-30 NOTE — Progress Notes (Signed)
Pt. Tolerated blood transfusion with no S&S of transfusion reaction. VSS remained stable.  Post transfusion H&H collected & sent to lab per MD order.

## 2018-04-30 NOTE — Progress Notes (Signed)
Results for JAKERIA, CAISSIE (MRN [id number]) as of 04/30/2018 15:42  Ref. Range 04/30/2018 14:47  Hemoglobin Latest Ref Range: 12.0 - 15.0 g/dL 8.8 (L)  HCT Latest Ref Range: 36.0 - 46.0 % 30.4 (L)   post trasfusion H&H

## 2018-05-01 DIAGNOSIS — D509 Iron deficiency anemia, unspecified: Secondary | ICD-10-CM | POA: Diagnosis not present

## 2018-05-02 LAB — BPAM RBC
Blood Product Expiration Date: 201908302359
ISSUE DATE / TIME: 201908131135
Unit Type and Rh: 9500

## 2018-05-02 LAB — TYPE AND SCREEN
ABO/RH(D): O NEG
Antibody Screen: NEGATIVE
Unit division: 0

## 2018-05-06 ENCOUNTER — Encounter (INDEPENDENT_AMBULATORY_CARE_PROVIDER_SITE_OTHER): Payer: PPO | Admitting: Ophthalmology

## 2018-05-06 DIAGNOSIS — H43813 Vitreous degeneration, bilateral: Secondary | ICD-10-CM

## 2018-05-06 DIAGNOSIS — E113313 Type 2 diabetes mellitus with moderate nonproliferative diabetic retinopathy with macular edema, bilateral: Secondary | ICD-10-CM | POA: Diagnosis not present

## 2018-05-06 DIAGNOSIS — H2513 Age-related nuclear cataract, bilateral: Secondary | ICD-10-CM | POA: Diagnosis not present

## 2018-05-06 DIAGNOSIS — E11311 Type 2 diabetes mellitus with unspecified diabetic retinopathy with macular edema: Secondary | ICD-10-CM | POA: Diagnosis not present

## 2018-05-06 DIAGNOSIS — H35033 Hypertensive retinopathy, bilateral: Secondary | ICD-10-CM | POA: Diagnosis not present

## 2018-05-06 DIAGNOSIS — I10 Essential (primary) hypertension: Secondary | ICD-10-CM | POA: Diagnosis not present

## 2018-05-13 ENCOUNTER — Ambulatory Visit (HOSPITAL_COMMUNITY): Payer: PPO | Admitting: Licensed Clinical Social Worker

## 2018-05-14 ENCOUNTER — Ambulatory Visit
Admit: 2018-05-14 | Discharge: 2018-05-15 | Payer: PRIVATE HEALTH INSURANCE | Attending: Diagnostic Radiology | Primary: Diagnostic Radiology

## 2018-05-14 DIAGNOSIS — I81 Portal vein thrombosis: Secondary | ICD-10-CM

## 2018-05-14 DIAGNOSIS — K7290 Hepatic failure, unspecified without coma: Principal | ICD-10-CM

## 2018-05-14 DIAGNOSIS — K31819 Angiodysplasia of stomach and duodenum without bleeding: Secondary | ICD-10-CM

## 2018-05-17 DIAGNOSIS — R0609 Other forms of dyspnea: Secondary | ICD-10-CM | POA: Diagnosis not present

## 2018-05-22 DIAGNOSIS — Z299 Encounter for prophylactic measures, unspecified: Secondary | ICD-10-CM | POA: Diagnosis not present

## 2018-05-22 DIAGNOSIS — Z1339 Encounter for screening examination for other mental health and behavioral disorders: Secondary | ICD-10-CM | POA: Diagnosis not present

## 2018-05-22 DIAGNOSIS — Z7189 Other specified counseling: Secondary | ICD-10-CM | POA: Diagnosis not present

## 2018-05-22 DIAGNOSIS — I10 Essential (primary) hypertension: Secondary | ICD-10-CM | POA: Diagnosis not present

## 2018-05-22 DIAGNOSIS — Z1211 Encounter for screening for malignant neoplasm of colon: Secondary | ICD-10-CM | POA: Diagnosis not present

## 2018-05-22 DIAGNOSIS — K7290 Hepatic failure, unspecified without coma: Secondary | ICD-10-CM | POA: Diagnosis not present

## 2018-05-22 DIAGNOSIS — Z6832 Body mass index (BMI) 32.0-32.9, adult: Secondary | ICD-10-CM | POA: Diagnosis not present

## 2018-05-22 DIAGNOSIS — Z1331 Encounter for screening for depression: Secondary | ICD-10-CM | POA: Diagnosis not present

## 2018-05-22 DIAGNOSIS — R5383 Other fatigue: Secondary | ICD-10-CM | POA: Diagnosis not present

## 2018-05-22 DIAGNOSIS — F329 Major depressive disorder, single episode, unspecified: Secondary | ICD-10-CM | POA: Diagnosis not present

## 2018-05-22 DIAGNOSIS — Z0000 Encounter for general adult medical examination without abnormal findings: Secondary | ICD-10-CM | POA: Diagnosis not present

## 2018-05-23 DIAGNOSIS — E7800 Pure hypercholesterolemia, unspecified: Secondary | ICD-10-CM | POA: Diagnosis not present

## 2018-05-23 DIAGNOSIS — R5383 Other fatigue: Secondary | ICD-10-CM | POA: Diagnosis not present

## 2018-05-23 DIAGNOSIS — Z79899 Other long term (current) drug therapy: Secondary | ICD-10-CM | POA: Diagnosis not present

## 2018-05-23 DIAGNOSIS — K7290 Hepatic failure, unspecified without coma: Secondary | ICD-10-CM | POA: Diagnosis not present

## 2018-05-27 ENCOUNTER — Other Ambulatory Visit (HOSPITAL_COMMUNITY)
Admission: RE | Admit: 2018-05-27 | Discharge: 2018-05-27 | Disposition: A | Discharge status: Home / Self Care | Payer: PPO | Source: Ambulatory Visit | Attending: Internal Medicine | Admitting: Internal Medicine

## 2018-05-27 ENCOUNTER — Other Ambulatory Visit (INDEPENDENT_AMBULATORY_CARE_PROVIDER_SITE_OTHER): Payer: Self-pay | Admitting: *Deleted

## 2018-05-27 ENCOUNTER — Encounter (HOSPITAL_COMMUNITY): Admission: RE | Admit: 2018-05-27 | Payer: PPO | Source: Ambulatory Visit

## 2018-05-27 DIAGNOSIS — D649 Anemia, unspecified: Secondary | ICD-10-CM | POA: Diagnosis not present

## 2018-05-27 LAB — HEMOGLOBIN AND HEMATOCRIT, BLOOD
HCT: 26.5 % — ABNORMAL LOW (ref 36.0–46.0)
Hemoglobin: 7.6 g/dL — ABNORMAL LOW (ref 12.0–15.0)

## 2018-05-27 LAB — PREPARE RBC (CROSSMATCH)

## 2018-05-28 ENCOUNTER — Inpatient Hospital Stay (HOSPITAL_COMMUNITY): Admission: RE | Admit: 2018-05-28 | Payer: PPO | Source: Ambulatory Visit

## 2018-05-29 ENCOUNTER — Encounter (HOSPITAL_COMMUNITY)
Admission: RE | Admit: 2018-05-29 | Discharge: 2018-05-29 | Disposition: A | Discharge status: Home / Self Care | Payer: PPO | Source: Ambulatory Visit | Attending: Internal Medicine | Admitting: Internal Medicine

## 2018-05-29 ENCOUNTER — Encounter (HOSPITAL_COMMUNITY): Payer: Self-pay

## 2018-05-29 DIAGNOSIS — D509 Iron deficiency anemia, unspecified: Secondary | ICD-10-CM | POA: Diagnosis not present

## 2018-05-29 DIAGNOSIS — K7460 Unspecified cirrhosis of liver: Secondary | ICD-10-CM | POA: Insufficient documentation

## 2018-05-29 LAB — HEMOGLOBIN AND HEMATOCRIT, BLOOD
HCT: 30.8 % — ABNORMAL LOW (ref 36.0–46.0)
Hemoglobin: 9 g/dL — ABNORMAL LOW (ref 12.0–15.0)

## 2018-05-29 MED ORDER — SODIUM CHLORIDE 0.9% IV SOLUTION
Freq: Once | INTRAVENOUS | Status: AC
  Administered 2018-05-29: 09:00:00 via INTRAVENOUS

## 2018-05-29 MED ORDER — FUROSEMIDE 10 MG/ML IJ SOLN
INTRAMUSCULAR | Status: AC
  Filled 2018-05-29: qty 4

## 2018-05-29 MED ORDER — FUROSEMIDE 10 MG/ML IJ SOLN
20.0000 mg | Freq: Once | INTRAMUSCULAR | Status: AC
  Administered 2018-05-29: 20 mg via INTRAVENOUS

## 2018-05-29 NOTE — Progress Notes (Signed)
Blood draw and sent to lab for Hgb/Hct results.

## 2018-05-30 LAB — TYPE AND SCREEN
ABO/RH(D): O NEG
Antibody Screen: NEGATIVE
Unit division: 0
Unit division: 0

## 2018-05-30 LAB — BPAM RBC
Blood Product Expiration Date: 201910092359
Blood Product Expiration Date: 201910162359
ISSUE DATE / TIME: 201909110857
ISSUE DATE / TIME: 201909111044
Unit Type and Rh: 9500
Unit Type and Rh: 9500

## 2018-05-31 ENCOUNTER — Encounter (INDEPENDENT_AMBULATORY_CARE_PROVIDER_SITE_OTHER): Payer: PPO | Admitting: Ophthalmology

## 2018-05-31 DIAGNOSIS — H43813 Vitreous degeneration, bilateral: Secondary | ICD-10-CM

## 2018-05-31 DIAGNOSIS — I10 Essential (primary) hypertension: Secondary | ICD-10-CM | POA: Diagnosis not present

## 2018-05-31 DIAGNOSIS — E113312 Type 2 diabetes mellitus with moderate nonproliferative diabetic retinopathy with macular edema, left eye: Secondary | ICD-10-CM

## 2018-05-31 DIAGNOSIS — E11311 Type 2 diabetes mellitus with unspecified diabetic retinopathy with macular edema: Secondary | ICD-10-CM

## 2018-05-31 DIAGNOSIS — H35033 Hypertensive retinopathy, bilateral: Secondary | ICD-10-CM | POA: Diagnosis not present

## 2018-05-31 DIAGNOSIS — E113291 Type 2 diabetes mellitus with mild nonproliferative diabetic retinopathy without macular edema, right eye: Secondary | ICD-10-CM

## 2018-05-31 DIAGNOSIS — H2513 Age-related nuclear cataract, bilateral: Secondary | ICD-10-CM

## 2018-06-03 ENCOUNTER — Other Ambulatory Visit (INDEPENDENT_AMBULATORY_CARE_PROVIDER_SITE_OTHER): Payer: Self-pay | Admitting: Internal Medicine

## 2018-06-12 ENCOUNTER — Other Ambulatory Visit (HOSPITAL_COMMUNITY)
Admission: RE | Admit: 2018-06-12 | Discharge: 2018-06-12 | Disposition: A | Discharge status: Home / Self Care | Payer: PPO | Source: Ambulatory Visit | Attending: Gastroenterology | Admitting: Gastroenterology

## 2018-06-12 DIAGNOSIS — K7469 Other cirrhosis of liver: Secondary | ICD-10-CM | POA: Diagnosis not present

## 2018-06-12 LAB — COMPREHENSIVE METABOLIC PANEL
ALT: 31 U/L (ref 0–44)
AST: 33 U/L (ref 15–41)
Albumin: 2.7 g/dL — ABNORMAL LOW (ref 3.5–5.0)
Alkaline Phosphatase: 85 U/L (ref 38–126)
Anion gap: 7 (ref 5–15)
BUN: 14 mg/dL (ref 8–23)
CO2: 23 mmol/L (ref 22–32)
Calcium: 8.1 mg/dL — ABNORMAL LOW (ref 8.9–10.3)
Chloride: 106 mmol/L (ref 98–111)
Creatinine, Ser: 0.8 mg/dL (ref 0.44–1.00)
GFR calc Af Amer: >60 mL/min (ref >60)
GFR calc non Af Amer: >60 mL/min (ref >60)
Glucose, Bld: 177 mg/dL — ABNORMAL HIGH (ref 70–99)
Potassium: 3.4 mmol/L — ABNORMAL LOW (ref 3.5–5.1)
Sodium: 136 mmol/L (ref 135–145)
Total Bilirubin: 2.1 mg/dL — ABNORMAL HIGH (ref 0.3–1.2)
Total Protein: 6.1 g/dL — ABNORMAL LOW (ref 6.5–8.1)

## 2018-06-12 LAB — PROTIME-INR
INR: 1.4
Prothrombin Time: 17 seconds — ABNORMAL HIGH (ref 11.4–15.2)

## 2018-06-12 LAB — CBC
HCT: 30.9 % — ABNORMAL LOW (ref 36.0–46.0)
Hemoglobin: 9.2 g/dL — ABNORMAL LOW (ref 12.0–15.0)
MCH: 23.8 pg — ABNORMAL LOW (ref 26.0–34.0)
MCHC: 29.8 g/dL — ABNORMAL LOW (ref 30.0–36.0)
MCV: 79.8 fL (ref 78.0–100.0)
Platelets: 39 10*3/uL — ABNORMAL LOW (ref 150–400)
RBC: 3.87 MIL/uL (ref 3.87–5.11)
RDW: 20.4 % — ABNORMAL HIGH (ref 11.5–15.5)
WBC: 3.5 10*3/uL — ABNORMAL LOW (ref 4.0–10.5)

## 2018-06-13 LAB — MISC LABCORP TEST (SEND OUT): Labcorp test code: 737610

## 2018-06-14 LAB — MISC LABCORP TEST (SEND OUT): Labcorp test code: 700844

## 2018-06-17 DIAGNOSIS — R0609 Other forms of dyspnea: Secondary | ICD-10-CM | POA: Diagnosis not present

## 2018-06-18 ENCOUNTER — Ambulatory Visit
Admit: 2018-06-18 | Discharge: 2018-06-19 | Payer: PRIVATE HEALTH INSURANCE | Attending: Student in an Organized Health Care Education/Training Program | Primary: Student in an Organized Health Care Education/Training Program

## 2018-06-18 DIAGNOSIS — F419 Anxiety disorder, unspecified: Principal | ICD-10-CM

## 2018-06-18 DIAGNOSIS — F329 Major depressive disorder, single episode, unspecified: Secondary | ICD-10-CM

## 2018-06-18 DIAGNOSIS — F411 Generalized anxiety disorder: Secondary | ICD-10-CM

## 2018-06-18 MED ORDER — BUSPIRONE 5 MG TABLET
ORAL_TABLET | Freq: Two times a day (BID) | ORAL | 3 refills | 0.00000 days | Status: CP
Start: 2018-06-18 — End: 2018-10-29

## 2018-06-21 DIAGNOSIS — I10 Essential (primary) hypertension: Secondary | ICD-10-CM | POA: Diagnosis not present

## 2018-06-21 DIAGNOSIS — K7290 Hepatic failure, unspecified without coma: Secondary | ICD-10-CM | POA: Diagnosis not present

## 2018-06-21 DIAGNOSIS — E7800 Pure hypercholesterolemia, unspecified: Secondary | ICD-10-CM | POA: Diagnosis not present

## 2018-06-21 DIAGNOSIS — E039 Hypothyroidism, unspecified: Secondary | ICD-10-CM | POA: Diagnosis not present

## 2018-06-21 DIAGNOSIS — E1165 Type 2 diabetes mellitus with hyperglycemia: Secondary | ICD-10-CM | POA: Diagnosis not present

## 2018-06-26 ENCOUNTER — Encounter (INDEPENDENT_AMBULATORY_CARE_PROVIDER_SITE_OTHER): Payer: PPO | Admitting: Ophthalmology

## 2018-06-26 DIAGNOSIS — H35033 Hypertensive retinopathy, bilateral: Secondary | ICD-10-CM

## 2018-06-26 DIAGNOSIS — E113392 Type 2 diabetes mellitus with moderate nonproliferative diabetic retinopathy without macular edema, left eye: Secondary | ICD-10-CM | POA: Diagnosis not present

## 2018-06-26 DIAGNOSIS — I10 Essential (primary) hypertension: Secondary | ICD-10-CM | POA: Diagnosis not present

## 2018-06-26 DIAGNOSIS — E113311 Type 2 diabetes mellitus with moderate nonproliferative diabetic retinopathy with macular edema, right eye: Secondary | ICD-10-CM | POA: Diagnosis not present

## 2018-06-26 DIAGNOSIS — H43813 Vitreous degeneration, bilateral: Secondary | ICD-10-CM | POA: Diagnosis not present

## 2018-06-26 DIAGNOSIS — E11311 Type 2 diabetes mellitus with unspecified diabetic retinopathy with macular edema: Secondary | ICD-10-CM

## 2018-07-02 ENCOUNTER — Telehealth (INDEPENDENT_AMBULATORY_CARE_PROVIDER_SITE_OTHER): Payer: Self-pay | Admitting: *Deleted

## 2018-07-02 ENCOUNTER — Encounter (HOSPITAL_COMMUNITY): Payer: Self-pay

## 2018-07-02 ENCOUNTER — Inpatient Hospital Stay (HOSPITAL_COMMUNITY)
Admission: EM | Admit: 2018-07-02 | Discharge: 2018-07-07 | DRG: 377 | Disposition: A | Discharge status: Other Acute Inpatient Hospital | Payer: PPO | Attending: Internal Medicine | Admitting: Internal Medicine

## 2018-07-02 ENCOUNTER — Other Ambulatory Visit: Payer: Self-pay

## 2018-07-02 ENCOUNTER — Ambulatory Visit
Admit: 2018-07-02 | Discharge: 2018-07-03 | Payer: PRIVATE HEALTH INSURANCE | Attending: Gastroenterology | Primary: Gastroenterology

## 2018-07-02 DIAGNOSIS — K7290 Hepatic failure, unspecified without coma: Secondary | ICD-10-CM

## 2018-07-02 DIAGNOSIS — I81 Portal vein thrombosis: Secondary | ICD-10-CM

## 2018-07-02 DIAGNOSIS — K7460 Unspecified cirrhosis of liver: Principal | ICD-10-CM

## 2018-07-02 DIAGNOSIS — Z79890 Hormone replacement therapy (postmenopausal): Secondary | ICD-10-CM | POA: Diagnosis not present

## 2018-07-02 DIAGNOSIS — Z794 Long term (current) use of insulin: Secondary | ICD-10-CM

## 2018-07-02 DIAGNOSIS — I2510 Atherosclerotic heart disease of native coronary artery without angina pectoris: Secondary | ICD-10-CM | POA: Diagnosis present

## 2018-07-02 DIAGNOSIS — E7800 Pure hypercholesterolemia, unspecified: Secondary | ICD-10-CM | POA: Diagnosis present

## 2018-07-02 DIAGNOSIS — K921 Melena: Secondary | ICD-10-CM | POA: Diagnosis present

## 2018-07-02 DIAGNOSIS — I8510 Secondary esophageal varices without bleeding: Secondary | ICD-10-CM | POA: Diagnosis present

## 2018-07-02 DIAGNOSIS — M797 Fibromyalgia: Secondary | ICD-10-CM | POA: Diagnosis present

## 2018-07-02 DIAGNOSIS — Z8249 Family history of ischemic heart disease and other diseases of the circulatory system: Secondary | ICD-10-CM

## 2018-07-02 DIAGNOSIS — T8089XA Other complications following infusion, transfusion and therapeutic injection, initial encounter: Secondary | ICD-10-CM | POA: Diagnosis not present

## 2018-07-02 DIAGNOSIS — I444 Left anterior fascicular block: Secondary | ICD-10-CM | POA: Diagnosis not present

## 2018-07-02 DIAGNOSIS — E1169 Type 2 diabetes mellitus with other specified complication: Secondary | ICD-10-CM

## 2018-07-02 DIAGNOSIS — D649 Anemia, unspecified: Secondary | ICD-10-CM

## 2018-07-02 DIAGNOSIS — E876 Hypokalemia: Secondary | ICD-10-CM | POA: Diagnosis present

## 2018-07-02 DIAGNOSIS — F419 Anxiety disorder, unspecified: Secondary | ICD-10-CM | POA: Diagnosis present

## 2018-07-02 DIAGNOSIS — E039 Hypothyroidism, unspecified: Secondary | ICD-10-CM | POA: Diagnosis present

## 2018-07-02 DIAGNOSIS — Z833 Family history of diabetes mellitus: Secondary | ICD-10-CM

## 2018-07-02 DIAGNOSIS — K31811 Angiodysplasia of stomach and duodenum with bleeding: Secondary | ICD-10-CM | POA: Diagnosis not present

## 2018-07-02 DIAGNOSIS — D500 Iron deficiency anemia secondary to blood loss (chronic): Secondary | ICD-10-CM | POA: Diagnosis not present

## 2018-07-02 DIAGNOSIS — Z23 Encounter for immunization: Secondary | ICD-10-CM | POA: Diagnosis present

## 2018-07-02 DIAGNOSIS — E119 Type 2 diabetes mellitus without complications: Secondary | ICD-10-CM | POA: Diagnosis not present

## 2018-07-02 DIAGNOSIS — K3189 Other diseases of stomach and duodenum: Secondary | ICD-10-CM | POA: Diagnosis not present

## 2018-07-02 DIAGNOSIS — I272 Pulmonary hypertension, unspecified: Secondary | ICD-10-CM | POA: Diagnosis present

## 2018-07-02 DIAGNOSIS — K31819 Angiodysplasia of stomach and duodenum without bleeding: Secondary | ICD-10-CM | POA: Diagnosis present

## 2018-07-02 DIAGNOSIS — Z6841 Body Mass Index (BMI) 40.0 and over, adult: Secondary | ICD-10-CM

## 2018-07-02 DIAGNOSIS — Z79899 Other long term (current) drug therapy: Secondary | ICD-10-CM | POA: Diagnosis not present

## 2018-07-02 DIAGNOSIS — R188 Other ascites: Secondary | ICD-10-CM | POA: Diagnosis not present

## 2018-07-02 DIAGNOSIS — M810 Age-related osteoporosis without current pathological fracture: Secondary | ICD-10-CM | POA: Diagnosis present

## 2018-07-02 DIAGNOSIS — K7581 Nonalcoholic steatohepatitis (NASH): Secondary | ICD-10-CM | POA: Diagnosis present

## 2018-07-02 DIAGNOSIS — F411 Generalized anxiety disorder: Secondary | ICD-10-CM | POA: Diagnosis not present

## 2018-07-02 DIAGNOSIS — Y848 Other medical procedures as the cause of abnormal reaction of the patient, or of later complication, without mention of misadventure at the time of the procedure: Secondary | ICD-10-CM | POA: Diagnosis not present

## 2018-07-02 DIAGNOSIS — Z9049 Acquired absence of other specified parts of digestive tract: Secondary | ICD-10-CM | POA: Diagnosis not present

## 2018-07-02 DIAGNOSIS — R531 Weakness: Secondary | ICD-10-CM | POA: Diagnosis not present

## 2018-07-02 DIAGNOSIS — D509 Iron deficiency anemia, unspecified: Secondary | ICD-10-CM | POA: Diagnosis present

## 2018-07-02 DIAGNOSIS — D696 Thrombocytopenia, unspecified: Secondary | ICD-10-CM | POA: Diagnosis present

## 2018-07-02 DIAGNOSIS — E785 Hyperlipidemia, unspecified: Secondary | ICD-10-CM | POA: Diagnosis not present

## 2018-07-02 DIAGNOSIS — K760 Fatty (change of) liver, not elsewhere classified: Secondary | ICD-10-CM | POA: Diagnosis not present

## 2018-07-02 DIAGNOSIS — K219 Gastro-esophageal reflux disease without esophagitis: Secondary | ICD-10-CM | POA: Diagnosis present

## 2018-07-02 DIAGNOSIS — I10 Essential (primary) hypertension: Secondary | ICD-10-CM | POA: Diagnosis not present

## 2018-07-02 DIAGNOSIS — Z90710 Acquired absence of both cervix and uterus: Secondary | ICD-10-CM

## 2018-07-02 DIAGNOSIS — F329 Major depressive disorder, single episode, unspecified: Secondary | ICD-10-CM | POA: Diagnosis not present

## 2018-07-02 DIAGNOSIS — K766 Portal hypertension: Secondary | ICD-10-CM | POA: Diagnosis present

## 2018-07-02 DIAGNOSIS — G9340 Encephalopathy, unspecified: Secondary | ICD-10-CM | POA: Diagnosis not present

## 2018-07-02 LAB — COMPREHENSIVE METABOLIC PANEL
ALT: 37 U/L (ref 0–44)
AST: 35 U/L (ref 15–41)
Albumin: 2.7 g/dL — ABNORMAL LOW (ref 3.5–5.0)
Alkaline Phosphatase: 112 U/L (ref 38–126)
Anion gap: 9 (ref 5–15)
BUN: 14 mg/dL (ref 8–23)
CO2: 21 mmol/L — ABNORMAL LOW (ref 22–32)
Calcium: 8 mg/dL — ABNORMAL LOW (ref 8.9–10.3)
Chloride: 106 mmol/L (ref 98–111)
Creatinine, Ser: 1.1 mg/dL — ABNORMAL HIGH (ref 0.44–1.00)
GFR calc Af Amer: >60 mL/min (ref >60)
GFR calc non Af Amer: 52 mL/min — ABNORMAL LOW (ref >60)
Glucose, Bld: 138 mg/dL — ABNORMAL HIGH (ref 70–99)
Potassium: 3.4 mmol/L — ABNORMAL LOW (ref 3.5–5.1)
Sodium: 136 mmol/L (ref 135–145)
Total Bilirubin: 2.2 mg/dL — ABNORMAL HIGH (ref 0.3–1.2)
Total Protein: 6.5 g/dL (ref 6.5–8.1)

## 2018-07-02 LAB — GLUCOSE, CAPILLARY: Glucose-Capillary: 206 mg/dL — ABNORMAL HIGH (ref 70–99)

## 2018-07-02 LAB — CBC WITH DIFFERENTIAL/PLATELET
Abs Immature Granulocytes: 0.02 10*3/uL (ref 0.00–0.07)
Basophils Absolute: 0.1 10*3/uL (ref 0.0–0.1)
Basophils Relative: 1 %
Eosinophils Absolute: 0.2 10*3/uL (ref 0.0–0.5)
Eosinophils Relative: 2 %
HCT: 26.4 % — ABNORMAL LOW (ref 36.0–46.0)
Hemoglobin: 7 g/dL — ABNORMAL LOW (ref 12.0–15.0)
Immature Granulocytes: 0 %
Lymphocytes Relative: 10 %
Lymphs Abs: 0.9 10*3/uL (ref 0.7–4.0)
MCH: 23 pg — ABNORMAL LOW (ref 26.0–34.0)
MCHC: 26.5 g/dL — ABNORMAL LOW (ref 30.0–36.0)
MCV: 86.8 fL (ref 80.0–100.0)
Monocytes Absolute: 0.7 10*3/uL (ref 0.1–1.0)
Monocytes Relative: 8 %
Neutro Abs: 6.8 10*3/uL (ref 1.7–7.7)
Neutrophils Relative %: 79 %
Platelets: 92 10*3/uL — ABNORMAL LOW (ref 150–400)
RBC: 3.04 MIL/uL — ABNORMAL LOW (ref 3.87–5.11)
RDW: 20.3 % — ABNORMAL HIGH (ref 11.5–15.5)
WBC: 8.6 10*3/uL (ref 4.0–10.5)
nRBC: 0 % (ref 0.0–0.2)

## 2018-07-02 LAB — IRON AND TIBC
Iron: 33 ug/dL (ref 28–170)
Saturation Ratios: 8 % — ABNORMAL LOW (ref 10.4–31.8)
TIBC: 437 ug/dL (ref 250–450)
UIBC: 404 ug/dL

## 2018-07-02 LAB — FERRITIN: Ferritin: 3 ng/mL — ABNORMAL LOW (ref 11–307)

## 2018-07-02 LAB — LIPASE, BLOOD: Lipase: 46 U/L (ref 11–51)

## 2018-07-02 LAB — PREPARE RBC (CROSSMATCH)

## 2018-07-02 LAB — BRAIN NATRIURETIC PEPTIDE: B Natriuretic Peptide: 20 pg/mL (ref 0.0–100.0)

## 2018-07-02 MED ORDER — VITAMIN D 1000 UNITS PO TABS
1000.0000 [IU] | ORAL_TABLET | ORAL | Status: DC
  Administered 2018-07-03 – 2018-07-05 (×2): 1000 [IU] via ORAL
  Filled 2018-07-02 (×2): qty 1

## 2018-07-02 MED ORDER — SODIUM CHLORIDE 0.9 % IV SOLN: 250.0000 mL | INTRAVENOUS | Status: DC | PRN

## 2018-07-02 MED ORDER — PANTOPRAZOLE SODIUM 40 MG PO TBEC: 40.0000 mg | DELAYED_RELEASE_TABLET | Freq: Every day | ORAL | Status: DC

## 2018-07-02 MED ORDER — TORSEMIDE 20 MG PO TABS
20.0000 mg | ORAL_TABLET | Freq: Every day | ORAL | Status: DC
  Administered 2018-07-03 – 2018-07-07 (×5): 20 mg via ORAL
  Filled 2018-07-02 (×5): qty 1

## 2018-07-02 MED ORDER — MAGNESIUM OXIDE 400 (241.3 MG) MG PO TABS
400.0000 mg | ORAL_TABLET | Freq: Every day | ORAL | Status: DC
  Administered 2018-07-02 – 2018-07-07 (×6): 400 mg via ORAL
  Filled 2018-07-02 (×6): qty 1

## 2018-07-02 MED ORDER — PAROXETINE HCL 20 MG PO TABS
40.0000 mg | ORAL_TABLET | Freq: Every day | ORAL | Status: DC
  Administered 2018-07-03 – 2018-07-07 (×5): 40 mg via ORAL
  Filled 2018-07-02 (×5): qty 2

## 2018-07-02 MED ORDER — INFLUENZA VAC SPLIT QUAD 0.5 ML IM SUSY
0.5000 mL | PREFILLED_SYRINGE | INTRAMUSCULAR | Status: AC
  Administered 2018-07-03: 0.5 mL via INTRAMUSCULAR
  Filled 2018-07-02: qty 0.5

## 2018-07-02 MED ORDER — LACTULOSE 10 GM/15ML PO SOLN: 20.0000 g | Freq: Every day | ORAL | Status: DC | PRN

## 2018-07-02 MED ORDER — SODIUM CHLORIDE 0.9% FLUSH: 3.0000 mL | INTRAVENOUS | Status: DC | PRN

## 2018-07-02 MED ORDER — LEVOTHYROXINE SODIUM 25 MCG PO TABS
25.0000 ug | ORAL_TABLET | Freq: Every day | ORAL | Status: DC
  Administered 2018-07-03 – 2018-07-07 (×5): 25 ug via ORAL
  Filled 2018-07-02 (×5): qty 1

## 2018-07-02 MED ORDER — ROSUVASTATIN CALCIUM 10 MG PO TABS
5.0000 mg | ORAL_TABLET | ORAL | Status: DC
  Administered 2018-07-03 – 2018-07-06 (×3): 5 mg via ORAL
  Filled 2018-07-02 (×4): qty 1

## 2018-07-02 MED ORDER — INSULIN GLARGINE 100 UNIT/ML ~~LOC~~ SOLN
55.0000 [IU] | Freq: Every morning | SUBCUTANEOUS | Status: DC
  Filled 2018-07-02: qty 0.55

## 2018-07-02 MED ORDER — SODIUM CHLORIDE 0.9% FLUSH
3.0000 mL | Freq: Two times a day (BID) | INTRAVENOUS | Status: DC
  Administered 2018-07-03 – 2018-07-06 (×7): 3 mL via INTRAVENOUS

## 2018-07-02 MED ORDER — POTASSIUM CHLORIDE CRYS ER 10 MEQ PO TBCR
10.0000 meq | EXTENDED_RELEASE_TABLET | Freq: Two times a day (BID) | ORAL | Status: DC
  Administered 2018-07-02 – 2018-07-07 (×10): 10 meq via ORAL
  Filled 2018-07-02 (×10): qty 1

## 2018-07-02 MED ORDER — PNEUMOCOCCAL VAC POLYVALENT 25 MCG/0.5ML IJ INJ
0.5000 mL | INJECTION | INTRAMUSCULAR | Status: AC
  Administered 2018-07-03: 0.5 mL via INTRAMUSCULAR
  Filled 2018-07-02: qty 0.5

## 2018-07-02 MED ORDER — SODIUM CHLORIDE 0.9 % IV SOLN: 10.0000 mL/h | Freq: Once | INTRAVENOUS | Status: DC

## 2018-07-02 MED ORDER — SPIRONOLACTONE 25 MG PO TABS
50.0000 mg | ORAL_TABLET | Freq: Two times a day (BID) | ORAL | Status: DC
  Administered 2018-07-03: 50 mg via ORAL
  Filled 2018-07-02 (×2): qty 2

## 2018-07-02 MED ORDER — FUROSEMIDE 10 MG/ML IJ SOLN
40.0000 mg | Freq: Once | INTRAMUSCULAR | Status: AC
  Administered 2018-07-03: 40 mg via INTRAVENOUS
  Filled 2018-07-02: qty 4

## 2018-07-02 MED ORDER — FAMOTIDINE IN NACL 20-0.9 MG/50ML-% IV SOLN
20.0000 mg | Freq: Two times a day (BID) | INTRAVENOUS | Status: DC
  Administered 2018-07-03 – 2018-07-06 (×7): 20 mg via INTRAVENOUS
  Filled 2018-07-02 (×8): qty 50

## 2018-07-02 MED ORDER — PRO-STAT SUGAR FREE PO LIQD
30.0000 mL | Freq: Two times a day (BID) | ORAL | Status: DC
  Administered 2018-07-02 – 2018-07-07 (×5): 30 mL via ORAL
  Filled 2018-07-02 (×12): qty 30

## 2018-07-02 MED ORDER — BUSPIRONE HCL 5 MG PO TABS
5.0000 mg | ORAL_TABLET | Freq: Two times a day (BID) | ORAL | Status: DC
  Administered 2018-07-02 – 2018-07-07 (×10): 5 mg via ORAL
  Filled 2018-07-02 (×11): qty 1

## 2018-07-02 MED ORDER — PROPRANOLOL HCL 20 MG PO TABS
10.0000 mg | ORAL_TABLET | Freq: Two times a day (BID) | ORAL | Status: DC
  Administered 2018-07-03 (×2): 10 mg via ORAL
  Filled 2018-07-02 (×2): qty 1

## 2018-07-02 MED ORDER — POLYMYXIN B-TRIMETHOPRIM 10000-0.1 UNIT/ML-% OP SOLN: 1.0000 [drp] | OPHTHALMIC | Status: DC

## 2018-07-02 NOTE — ED Provider Notes (Signed)
[facility] EMERGENCY DEPARTMENT Provider Note   CSN: [id number] Arrival date & time: 07/02/18  1640     History   Chief Complaint Chief Complaint  Patient presents with  . low hemoglobin    HPI Maria [name] is a 62 y.o. female.  HPI Patient with a history of cirrhosis, CHF, hypertension, G AVE presents with concern of weakness, dyspnea, pallor. Patient is working with [facility] healthcare on the liver transplant, and today had labs. En route home she was informed of critical abnormal values and sent here for evaluation. She notes that she has been feeling progressively weaker over the past week or so, without new medication changes. She has occasional black stool, but had brown stool earlier today. No hematemesis. No syncope, no persistent chest pain, occasional right mid lateral abdominal pain, which is not currently present. She is here with a family member who assists with the HPI.  Past Medical History:  Diagnosis Date  . Anemia   . CHF (congestive heart failure) ([place])   . Cirrhosis ([place])   . Depression   . Diabetes mellitus   . Esophageal bleed, non-variceal   . Eye hemorrhage    Behind left eye  . Fibromyalgia   . Hypercholesteremia   . Hypertension   . Hypothyroidism   . NAFLD (nonalcoholic fatty liver disease)   . Osteoarthrosis   . Osteoporosis   . PONV (postoperative nausea and vomiting)   . UTI (urinary tract infection) 11/12 end of the month   Patient feels that she passed a kidney stone at that time    Patient Active Problem List   Diagnosis Date Noted  . UGI bleed 11/09/2017  . Insulin dependent diabetes mellitus ([place])   . Absolute anemia 09/13/2017  . Idiopathic esophageal varices with bleeding ([place]) 09/13/2017  . Other cirrhosis of liver ([place]) 08/22/2017  . NAFLD (nonalcoholic fatty liver disease) 08/14/2017  . Encephalopathy, hepatic ([place]) 08/14/2017  . Metabolic encephalopathy 28/78/6767  . Hyperammonemia ([place]) 07/18/2017  .  Hypothyroidism 07/18/2017  . Acute on chronic right-sided congestive heart failure ([place]) 07/06/2017  . Pulmonary hypertension ([place]) 07/06/2017  . GAVE (gastric antral vascular ectasia) 07/06/2017  . GI bleeding 07/06/2017  . Symptomatic anemia 07/04/2017  . Hypokalemia 07/04/2017  . Chronic diastolic heart failure ([place]) 06/11/2017  . CAD (coronary artery disease) 06/11/2017  . Nocturnal hypoxemia 02/22/2017  . Idiopathic esophageal varices without bleeding ([place]) 01/31/2017  . Dyspnea and respiratory abnormalities 01/25/2017  . Esophageal varices in cirrhosis ([place]) 05/19/2016  . Hepatic cirrhosis ([place]) 05/19/2016  . Esophageal varices ([place]) 12/09/2012  . Dysphagia, unspecified(787.20) 12/09/2012  . Anxiety 08/26/2012  . Cirrhosis, nonalcoholic ([place]) 20/94/7096  . Diabetes mellitus ([place]) 10/09/2011  . History of esophageal varices 10/09/2011  . Iron deficiency anemia 10/09/2011  . GERD (gastroesophageal reflux disease) 10/09/2011  . Thrombocytopenia ([place]) 10/09/2011    Past Surgical History:  Procedure Laterality Date  . APPENDECTOMY  1980  . BILATERAL SALPINGOOPHORECTOMY    . CHOLECYSTECTOMY    . COLONOSCOPY  03/15/2011  . COLONOSCOPY N/A 03/24/2016   Procedure: COLONOSCOPY;  Surgeon: [name], MD;  Location: [facility];  Service: Endoscopy;  Laterality: N/A;  855  . ESOPHAGEAL BANDING  04/24/2012   Procedure: ESOPHAGEAL BANDING;  Surgeon: [name], MD;  Location: [facility];  Service: Endoscopy;  Laterality: N/A;  . Esophageal BANDING  08/03/2012   [facility] in [place] , [place]  09/24/2012   Procedure: ESOPHAGEAL  BANDING;  Surgeon: [name], MD;  Location: [facility] ORS;  Service: Endoscopy;  Laterality: N/A;  Banding x 3  . ESOPHAGEAL BANDING N/A 01/29/2013   Procedure: ESOPHAGEAL BANDING;  Surgeon: [name], MD;  Location: [facility];  Service: Endoscopy;  Laterality: N/A;  . ESOPHAGEAL BANDING N/A 06/19/2014    Procedure: ESOPHAGEAL BANDING;  Surgeon: [name], MD;  Location: [facility];  Service: Endoscopy;  Laterality: N/A;  . ESOPHAGEAL BANDING N/A 01/05/2016   Procedure: ESOPHAGEAL BANDING;  Surgeon: [name], MD;  Location: [facility];  Service: Endoscopy;  Laterality: N/A;  . ESOPHAGEAL BANDING N/A 08/03/2016   Procedure: ESOPHAGEAL BANDING;  Surgeon: [name], MD;  [facility]: [facility];  Service: Endoscopy;  Laterality: N/A;  . ESOPHAGEAL BANDING N/A 05/02/2017   Procedure: ESOPHAGEAL BANDING;  Surgeon: [name], MD;  Location: [facility];  Service: Endoscopy;  Laterality: N/A;  . ESOPHAGOGASTRODUODENOSCOPY  04/24/2012   Procedure: ESOPHAGOGASTRODUODENOSCOPY (EGD);  Surgeon: [name], MD;  [facility];  Service: Endoscopy;  Laterality: N/A;  300  . ESOPHAGOGASTRODUODENOSCOPY N/A 01/29/2013   Procedure: ESOPHAGOGASTRODUODENOSCOPY (EGD);  Surgeon: [name], MD;  [facility];  Service: Endoscopy;  Laterality: N/A;  1200  . ESOPHAGOGASTRODUODENOSCOPY N/A 05/22/2013   Procedure: ESOPHAGOGASTRODUODENOSCOPY (EGD);  Surgeon: [name], MD;  [facility];  Service: Endoscopy;  Laterality: N/A;  1:55  . ESOPHAGOGASTRODUODENOSCOPY N/A 12/31/2013   Procedure: ESOPHAGOGASTRODUODENOSCOPY (EGD);  Surgeon: [name], MD;  [facility];  Service: Endoscopy;  Laterality: N/A;  1200  . ESOPHAGOGASTRODUODENOSCOPY N/A 06/19/2014   Procedure: ESOPHAGOGASTRODUODENOSCOPY (EGD);  Surgeon: [name], MD;  [facility];  Service: Endoscopy;  Laterality: N/A;  1055  . ESOPHAGOGASTRODUODENOSCOPY N/A 01/15/2015   Procedure: ESOPHAGOGASTRODUODENOSCOPY (EGD);  Surgeon: [name], MD;  [facility];  Service: Endoscopy;  Laterality: N/A;  730 - moved to 9:45 - moved to 1250-Ann notified pt  . ESOPHAGOGASTRODUODENOSCOPY N/A 06/30/2015   Procedure: ESOPHAGOGASTRODUODENOSCOPY (EGD);  Surgeon:  [name], MD;  [facility];  Service: Endoscopy;  Laterality: N/A;  1200  . ESOPHAGOGASTRODUODENOSCOPY N/A 01/05/2016   Procedure: ESOPHAGOGASTRODUODENOSCOPY (EGD);  Surgeon: [name], MD;  [facility];  Service: Endoscopy;  Laterality: N/A;  830  . ESOPHAGOGASTRODUODENOSCOPY N/A 08/03/2016   Procedure: ESOPHAGOGASTRODUODENOSCOPY (EGD);  Surgeon: [name], MD;  [facility];  Service: Endoscopy;  Laterality: N/A;  930  . ESOPHAGOGASTRODUODENOSCOPY N/A 05/02/2017   Procedure: ESOPHAGOGASTRODUODENOSCOPY (EGD);  Surgeon: [name], MD;  [facility];  Service: Endoscopy;  Laterality: N/A;  12:00  . ESOPHAGOGASTRODUODENOSCOPY N/A 08/17/2017   Procedure: ESOPHAGOGASTRODUODENOSCOPY (EGD);  Surgeon: [name], MD;  [facility];  Service: Endoscopy;  Laterality: N/A;  8:15  . ESOPHAGOGASTRODUODENOSCOPY N/A 09/12/2017   Procedure: ESOPHAGOGASTRODUODENOSCOPY (EGD);  Surgeon: [name], MD;  [facility];  Service: Endoscopy;  Laterality: N/A;  1040  . ESOPHAGOGASTRODUODENOSCOPY N/A 10/10/2017   Procedure: ESOPHAGOGASTRODUODENOSCOPY (EGD);  Surgeon: [name], MD;  [facility];  Service: Endoscopy;  Laterality: N/A;  225  . ESOPHAGOGASTRODUODENOSCOPY N/A 11/09/2017   Procedure: ESOPHAGOGASTRODUODENOSCOPY (EGD);  Surgeon: [name], MD;  [facility];  Service: Endoscopy;  Laterality: N/A;  . ESOPHAGOGASTRODUODENOSCOPY (EGD) WITH PROPOFOL  09/24/2012   Procedure: ESOPHAGOGASTRODUODENOSCOPY (EGD) WITH PROPOFOL;  Surgeon: [name], MD;  Location:  [facility];  Service: Endoscopy;  Laterality: N/A;  GE junction at 36  . ESOPHAGOGASTRODUODENOSCOPY (EGD) WITH PROPOFOL N/A 07/06/2017   Procedure: ESOPHAGOGASTRODUODENOSCOPY (EGD) WITH PROPOFOL;  Surgeon: [name], MD;  [facility]: [facility];  Service: Endoscopy;  Laterality: N/A;  . ESOPHAGOGASTRODUODENOSCOPY W/  BANDING  08/2010  . GIVENS CAPSULE STUDY N/A 07/05/2017   Procedure: GIVENS CAPSULE STUDY;  Surgeon: [name], MD;  [facility];  Service: Endoscopy;  Laterality: N/A;  . HOT HEMOSTASIS N/A 08/17/2017   Procedure: HOT HEMOSTASIS (ARGON PLASMA COAGULATION/BICAP);  Surgeon: [name], MD;  [facility]: [facility];  Service: Endoscopy;  Laterality: N/A;  . HOT HEMOSTASIS  09/12/2017   Procedure: HOT HEMOSTASIS (ARGON PLASMA COAGULATION/BICAP);  Surgeon: [name], MD;  Location[facility];  Service: Endoscopy;;  gastric  . HOT HEMOSTASIS  10/10/2017   Procedure: HOT HEMOSTASIS (ARGON PLASMA COAGULATION/BICAP);  Surgeon: [name], MD;  [facility]: [facility];  Service: Endoscopy;;  . POLYPECTOMY  03/24/2016   Procedure: POLYPECTOMY;  Surgeon: [name], MD;  Location[facility];  Service: Endoscopy;;  sigmoid polyp  . RIGHT HEART CATH N/A 04/16/2017   Procedure: Right Heart Cath;  Surgeon: [name], MD;  Location: [facility];  Service: Cardiovascular;  Laterality: N/A;  . RIGHT HEART CATH N/A 07/12/2017   Procedure: RIGHT HEART CATH;  Surgeon: [name], MD;  Location: [employer] [facility];  Service: Cardiovascular;  Laterality: N/A;  . TONSILLECTOMY    . UPPER GASTROINTESTINAL ENDOSCOPY  03/15/2011   EGD ED BANDING/TCS  . UPPER GASTROINTESTINAL ENDOSCOPY  09/05/2010  . UPPER GASTROINTESTINAL ENDOSCOPY  08/11/2010  . VAGINAL HYSTERECTOMY       OB History   None      Home Medications    Prior to Admission medications   Medication Sig Start Date End Date Taking? Authorizing Provider  busPIRone (BUSPAR) 5 MG tablet Take 5 mg by mouth 2 (two) times daily.   Yes [provider]  cholecalciferol (VITAMIN D) 1000 UNITS tablet Take 1,000 Units by mouth 3 (three) times a week.    Yes [provider]  Insulin Glargine (LANTUS SOLOSTAR) 100 UNIT/ML Solostar Pen Inject 38 Units daily at 10 pm into the  skin. Patient taking differently: Inject 55 Units into the skin every morning.  07/22/17  Yes [name], [name], MD  lactulose ([place]) 10 GM/15ML solution TAKE 30 MLS BY MOUTH FOUR TIMES DAILY Patient taking differently: Take 20 g by mouth daily as needed for mild constipation or moderate constipation.  01/07/18  Yes Rehman, Mechele Dawley, MD  levothyroxine (SYNTHROID, LEVOTHROID) 25 MCG tablet Take 25 mcg by mouth daily before breakfast.    Yes [provider]  magnesium oxide (MAG-OX) 400 MG tablet Take 400 mg by mouth daily.    Yes [provider]  OXYGEN Inhale 2 L into the lungs daily as needed (FOR BREATHING).    Yes [provider]  pantoprazole (PROTONIX) 40 MG tablet Take 1 tablet by mouth once daily Patient taking differently: Take 40 mg by mouth daily.  02/06/17  Yes Rehman, Mechele Dawley, MD  PARoxetine (PAXIL) 40 MG tablet Take 40 mg by mouth every morning.   Yes [provider]  Pediatric Multivitamins-Iron (FLINTSTONES PLUS IRON PO) Take 2 tablets by mouth daily.    Yes [provider]  propranolol (INDERAL) 20 MG tablet Take 1 tablet by mouth twice a day  Patient taking differently: Take 20 mg by mouth 2 (two) times daily.  06/03/18  Yes Setzer, Terri L, NP  rosuvastatin (CRESTOR) 5 MG tablet Take 1 tablet (5 mg total) by mouth every other day. 06/27/17 10/09/18 Yes [name], MD  spironolactone (ALDACTONE) 50 MG tablet Take 50 mg by mouth 2 (two) times daily.    Yes [provider]  HUMALOG 100 UNIT/ML injection INJECT 3 TO 10 UNITS SUBCUTANEOUSLY 3 TIMES DAILY PER SLIDING SCALE FOR 30 DAYS 07/01/18   [provider]  insulin aspart (NOVOLOG) 100 UNIT/ML FlexPen Use with given sliding scale  CBG 70-120: 0 units CBG 121-150: 1 unit CBG 151-200: 2 units CBG 201-250: 3 units CBG 251-300: 5 units CBG 301-350: 7 units CBG greater than 351: 9 units Patient taking differently: Inject 1-10 Units into the skin 3  (three) times daily with meals. Use with given sliding scale as directed with meals 07/22/17   [name], [name], MD  Insulin Pen Needle 31G X 5 MM MISC Use with each insulin injection 07/22/17   [name], [name], MD  torsemide (DEMADEX) 20 MG tablet Take 1 tablet (20 mg total) by mouth daily. 12/18/17   [name], MD    Family History Family History  Problem Relation Age of Onset  . Lung cancer Mother   . Diabetes Father   . Diabetes Sister   . Hypertension Sister   . Hypothyroidism Sister   . Colon cancer Brother   . Diabetes Sister   . Hypothyroidism Brother   . Healthy Daughter   . Obesity Daughter   . Hypertension Daughter     Social History Social History   Tobacco Use  . Smoking status: Never Smoker  . Smokeless tobacco: Never Used  Substance Use Topics  . Alcohol use: No    Alcohol/week: 0.0 standard drinks  . Drug use: No     Allergies   Feraheme [ferumoxytol] and Ultram [tramadol hcl]   Review of Systems Review of Systems  Constitutional:       Per HPI, otherwise negative  HENT:       Per HPI, otherwise negative  Respiratory:       Per HPI, otherwise negative  Cardiovascular:       Per HPI, otherwise negative  Gastrointestinal: Negative for vomiting.  Endocrine:       Negative aside from HPI  Genitourinary:       Neg aside from HPI   Musculoskeletal:       Per HPI, otherwise negative  Skin: Positive for pallor.  Neurological: Positive for weakness. Negative for syncope.  Hematological: Bruises/bleeds easily.     Physical Exam Updated Vital Signs BP 126/65   Pulse 80   Temp 97.9 F (36.6 C) (Oral)   Resp (!) 26   SpO2 99%   Physical Exam  Constitutional: She is oriented to person, place, and time. No distress.  Sickly appearing elderly female awake, alert, speaking clearly  HENT:  Head: Normocephalic and atraumatic.  Eyes: Conjunctivae and EOM are normal.  Cardiovascular: Normal rate and regular rhythm.   Murmur heard. Pulmonary/Chest: Effort normal and breath sounds normal. No stridor. No respiratory distress.  Abdominal: She exhibits no distension. There is no tenderness.  Musculoskeletal: She exhibits no edema.  Neurological: She is alert and oriented to person, place, and time. No cranial nerve deficit.  Skin: Skin is warm and dry. There is pallor.  Psychiatric: She has a normal mood and affect.  Nursing note and vitals reviewed.    ED Treatments / Results  Labs (all labs ordered are listed, but only abnormal results are displayed) Labs Reviewed  COMPREHENSIVE METABOLIC PANEL - Abnormal; Notable for the following components:      Result Value   Potassium 3.4 (*)    CO2 21 (*)    Glucose, Bld 138 (*)    Creatinine, Ser 1.10 (*)    Calcium 8.0 (*)    Albumin 2.7 (*)    Total Bilirubin 2.2 (*)    GFR calc non Af Amer 52 (*)    All other components within normal limits  CBC WITH DIFFERENTIAL/PLATELET - Abnormal; Notable for the following components:   RBC 3.04 (*)    Hemoglobin 7.0 (*)    HCT 26.4 (*)    MCH 23.0 (*)    MCHC 26.5 (*)    RDW 20.3 (*)    Platelets 92 (*)    All other components within normal limits  LIPASE, BLOOD  BRAIN NATRIURETIC PEPTIDE  TYPE AND SCREEN  PREPARE RBC (CROSSMATCH)    Procedures Procedures (including critical care time)  Medications Ordered in ED Medications  0.9 %  sodium chloride infusion (has no administration in time range)     Initial Impression / Assessment and Plan / ED Course  I have reviewed the triage vital signs and the nursing notes.  Pertinent labs & imaging results that were available during my care of the patient were reviewed by me and considered in my medical decision making (see chart for details).    Chart review notable for transfusion 1 month ago. 6:56 PM Labs notable for hemoglobin 7, consistent with reported outside hospital value.  On repeat exam the patient is mildly hypotensive, but otherwise awake  and alert, remains pale in appearance.  We discussed indications of her critically abnormal hemoglobin value, and will proceed with transfusion. Given the patient's pallor, known AV malformations in her stomach, patient will require admission for transfusion, further evaluation, monitoring, management    Final Clinical Impressions(s) / ED Diagnoses  Symptomatic anemia  CRITICAL CARE Performed by: Carmin Muskrat Total critical care time: 35 minutes Critical care time was exclusive of separately billable procedures and treating other patients. Critical care was necessary to treat or prevent imminent or life-threatening deterioration. Critical care was time spent personally by me on the following activities: development of treatment plan with patient and/or surrogate as well as nursing, discussions with consultants, evaluation of patient's response to treatment, examination of patient, obtaining history from patient or surrogate, ordering and performing treatments and interventions, ordering and review of laboratory studies, ordering and review of radiographic studies, pulse oximetry and re-evaluation of patient's condition.     Carmin Muskrat, MD 07/02/18 [phone]

## 2018-07-02 NOTE — Telephone Encounter (Signed)
Our office rec'd a call from [facility] regarding Ms.[place]. She was there today to talk with the physicians. They noted that she looked bad , they did lab work.patient was released. Larena Glassman states that before patient could get home they had the result of her H&H and the HGB was 6.4. They want [place] to do H&H,Type and Screen. Also, they inquired about the patient's last EGD, this was 11/09/2017. They are asking that this be done to make sure GAVE isn't  acting up.  It was explained to [place] that Dr.Rehman was away from the office for 2 weeks. She ask if there was anyone else that could do the procedure.  I called the patient, Upon answering the phone she was short of breath. I ask her how she was feeling and her reply was, weak , dizzy and I am short of breath. I explained to the patient  The conversation with Larena Glassman and about Dr.Rehman.  Based upon the result of the HBG and the patient's symptoms, I have advised the patient to go to [facility]/ED for further.evaluation.  Contact number for Larena Glassman -(c) [phone] , office @ [facility] [phone].

## 2018-07-02 NOTE — ED Notes (Signed)
Room assigned awaiting release

## 2018-07-02 NOTE — ED Notes (Signed)
Call for report Sheena, RN is taking report on another patient and will call right back

## 2018-07-02 NOTE — ED Triage Notes (Signed)
Pt had blood work done at [facility] and came back that her hemoglobin was 6.4. Is very jaundiced and has had black stools for a few weeks. Weakness as well

## 2018-07-02 NOTE — ED Notes (Signed)
Emergent pt in route to ED no room avail Report to Gwynneth Albright, RN who received report

## 2018-07-02 NOTE — H&P (Signed)
TRH H&P   Patient Demographics:    Maria [name], is a 62 y.o. female  MRN: [id number]   DOB - 01/22/1956  Admit Date - 07/02/2018  Outpatient Primary MD for the patient is Glenda Chroman, MD  Referring MD/NP/PA:  Carmin Muskrat  Outpatient Specialists:  [facility], Dr. Laural Golden  Patient coming from:  home  Chief Complaint  Patient presents with  . low hemoglobin      HPI:    Maria [name]  is a 62 y.o. female, w CAD, CHF, Dm2, Hypothyroidism, Anemia,  NASH cirrhosis, esophageal varices, GAVE, high-grade partial throbosis of portal vein,  apparently sent to ED by [facility] (where she is suppose to have TIPPS in the near future) due to Hgb 6.4 and black stool though she is on iron. + fatigue.  Pt denies fever, chills, cp, palp, sob, n/v, brbpr.   In Ed,  T 97.9, P 83, Bp 111/57  Pox 97%   Wbc 8.6, Hgb 7.0, Plt 92 Na 136, K3.4, Bun 14, Creatinine 1.10  Ast 35, Alt 37, Alk phos 112, T. Bili 2.2 BNP 20.0 Lipase 46  Pt will be admitted for symptomatic anemia, hypokalemia.         Review of systems:    In addition to the HPI above,  No Fever-chills, No Headache, No changes with Vision or hearing, No problems swallowing food or Liquids, No Chest pain, Cough or Shortness of Breath, No Abdominal pain, No Nausea or Vommitting, bowel movements loose due to lactulose No Blood in stool or Urine, No dysuria, No new skin rashes or bruises, No new joints pains-aches,  No new weakness, tingling, numbness in any extremity, No recent weight gain or loss, No polyuria, polydypsia or polyphagia, No significant Mental Stressors.  A full 10 point Review of Systems was done, except as stated above, all other Review of Systems were negative.   With Past History of the following :    Past Medical History:  Diagnosis Date  . Anemia   . CHF (congestive heart failure) ([place])   .  Cirrhosis ([place])   . Depression   . Diabetes mellitus   . Esophageal bleed, non-variceal   . Eye hemorrhage    Behind left eye  . Fibromyalgia   . Hypercholesteremia   . Hypertension   . Hypothyroidism   . NAFLD (nonalcoholic fatty liver disease)   . Osteoarthrosis   . Osteoporosis   . PONV (postoperative nausea and vomiting)   . UTI (urinary tract infection) 11/12 end of the month   Patient feels that she passed a kidney stone at that time      Past Surgical History:  Procedure Laterality Date  . APPENDECTOMY  1980  . BILATERAL SALPINGOOPHORECTOMY    . CHOLECYSTECTOMY    . COLONOSCOPY  03/15/2011  . COLONOSCOPY N/A 03/24/2016   Procedure: COLONOSCOPY;  Surgeon: [name],  MD;  [facility];  Service: Endoscopy;  Laterality: N/A;  855  . ESOPHAGEAL BANDING  04/24/2012   Procedure: ESOPHAGEAL BANDING;  Surgeon: [name], MD;  [facility];  Service: Endoscopy;  Laterality: N/A;  . Esophageal BANDING  08/03/2012   [facility] [address] , [place]  09/24/2012   Procedure: ESOPHAGEAL BANDING;  Surgeon: [name], MD;  Location: [facility];  Service: Endoscopy;  Laterality: N/A;  Banding x 3  . ESOPHAGEAL BANDING N/A 01/29/2013   Procedure: ESOPHAGEAL BANDING;  Surgeon: [name], MD;  [facility];  Service: Endoscopy;  Laterality: N/A;  . ESOPHAGEAL BANDING N/A 06/19/2014   Procedure: ESOPHAGEAL BANDING;  Surgeon: [name], MD;  [facility];  Service: Endoscopy;  Laterality: N/A;  . ESOPHAGEAL BANDING N/A 01/05/2016   Procedure: ESOPHAGEAL BANDING;  Surgeon: [name], MD;  [facility];  Service: Endoscopy;  Laterality: N/A;  . ESOPHAGEAL BANDING N/A 08/03/2016   Procedure: ESOPHAGEAL BANDING;  Surgeon: [name], MD;  [facility];  Service: Endoscopy;  Laterality: N/A;  . ESOPHAGEAL BANDING N/A 05/02/2017   Procedure: ESOPHAGEAL BANDING;  Surgeon:  [name], MD;  [facility];  Service: Endoscopy;  Laterality: N/A;  . ESOPHAGOGASTRODUODENOSCOPY  04/24/2012   Procedure: ESOPHAGOGASTRODUODENOSCOPY (EGD);  Surgeon: [name], MD;  [facility];  Service: Endoscopy;  Laterality: N/A;  300  . ESOPHAGOGASTRODUODENOSCOPY N/A 01/29/2013   Procedure: ESOPHAGOGASTRODUODENOSCOPY (EGD);  Surgeon: [name], MD;  [facility];  Service: Endoscopy;  Laterality: N/A;  1200  . ESOPHAGOGASTRODUODENOSCOPY N/A 05/22/2013   Procedure: ESOPHAGOGASTRODUODENOSCOPY (EGD);  Surgeon: [name], MD;  [facility];  Service: Endoscopy;  Laterality: N/A;  1:55  . ESOPHAGOGASTRODUODENOSCOPY N/A 12/31/2013   Procedure: ESOPHAGOGASTRODUODENOSCOPY (EGD);  Surgeon: [name], MD;  [facility];  Service: Endoscopy;  Laterality: N/A;  1200  . ESOPHAGOGASTRODUODENOSCOPY N/A 06/19/2014   Procedure: ESOPHAGOGASTRODUODENOSCOPY (EGD);  Surgeon: [name], MD;  [facility];  Service: Endoscopy;  Laterality: N/A;  1055  . ESOPHAGOGASTRODUODENOSCOPY N/A 01/15/2015   Procedure: ESOPHAGOGASTRODUODENOSCOPY (EGD);  Surgeon: [name], MD;  [facility];  Service: Endoscopy;  Laterality: N/A;  730 - moved to 9:45 - moved to 1250-Ann notified pt  . ESOPHAGOGASTRODUODENOSCOPY N/A 06/30/2015   Procedure: ESOPHAGOGASTRODUODENOSCOPY (EGD);  Surgeon: [name], MD;  [facility];  Service: Endoscopy;  Laterality: N/A;  1200  . ESOPHAGOGASTRODUODENOSCOPY N/A 01/05/2016   Procedure: ESOPHAGOGASTRODUODENOSCOPY (EGD);  Surgeon: [name], MD;  [facility];  Service: Endoscopy;  Laterality: N/A;  830  . ESOPHAGOGASTRODUODENOSCOPY N/A 08/03/2016   Procedure: ESOPHAGOGASTRODUODENOSCOPY (EGD);  Surgeon: [name], MD;  [facility];  Service: Endoscopy;  Laterality: N/A;  930  . ESOPHAGOGASTRODUODENOSCOPY N/A 05/02/2017   Procedure:  ESOPHAGOGASTRODUODENOSCOPY (EGD);  Surgeon: [name], MD;  [facility];  Service: Endoscopy;  Laterality: N/A;  12:00  . ESOPHAGOGASTRODUODENOSCOPY N/A 08/17/2017   Procedure: ESOPHAGOGASTRODUODENOSCOPY (EGD);  Surgeon: [name], MD;  [facility];  Service: Endoscopy;  Laterality: N/A;  8:15  . ESOPHAGOGASTRODUODENOSCOPY N/A 09/12/2017   Procedure: ESOPHAGOGASTRODUODENOSCOPY (EGD);  Surgeon: [name], MD;  [facility];  Service: Endoscopy;  Laterality: N/A;  1040  . ESOPHAGOGASTRODUODENOSCOPY N/A 10/10/2017   Procedure: ESOPHAGOGASTRODUODENOSCOPY (EGD);  Surgeon: [name], MD;  [facility];  Service: Endoscopy;  Laterality: N/A;  225  . ESOPHAGOGASTRODUODENOSCOPY N/A 11/09/2017   Procedure: ESOPHAGOGASTRODUODENOSCOPY (EGD);  Surgeon: [name], MD;  [facility];  Service: Endoscopy;  Laterality: N/A;  . ESOPHAGOGASTRODUODENOSCOPY (EGD) WITH PROPOFOL  09/24/2012   Procedure: ESOPHAGOGASTRODUODENOSCOPY (EGD) WITH PROPOFOL;  Surgeon: [name], MD;  [facility]: [facility];  Service: Endoscopy;  Laterality: N/A;  GE junction at 36  . ESOPHAGOGASTRODUODENOSCOPY (EGD) WITH PROPOFOL N/A 07/06/2017   Procedure: ESOPHAGOGASTRODUODENOSCOPY (EGD) WITH PROPOFOL;  Surgeon: [name], MD;  [facility];  Service: Endoscopy;  Laterality: N/A;  . ESOPHAGOGASTRODUODENOSCOPY W/ BANDING  08/2010  . GIVENS CAPSULE STUDY N/A 07/05/2017   Procedure: GIVENS CAPSULE STUDY;  Surgeon: [name], MD;  [facility];  Service: Endoscopy;  Laterality: N/A;  . HOT HEMOSTASIS N/A 08/17/2017   Procedure: HOT HEMOSTASIS (ARGON PLASMA COAGULATION/BICAP);  Surgeon: [name], MD;  [facility];  Service: Endoscopy;  Laterality: N/A;  . HOT HEMOSTASIS  09/12/2017   Procedure: HOT HEMOSTASIS (ARGON PLASMA COAGULATION/BICAP);  Surgeon: [name], MD;  [facility];   Service: Endoscopy;;  gastric  . HOT HEMOSTASIS  10/10/2017   Procedure: HOT HEMOSTASIS (ARGON PLASMA COAGULATION/BICAP);  Surgeon: [name], MD;  [facility];  Service: Endoscopy;;  . POLYPECTOMY  03/24/2016   Procedure: POLYPECTOMY;  Surgeon: [name], MD;  [facility];  Service: Endoscopy;;  sigmoid polyp  . RIGHT HEART CATH N/A 04/16/2017   Procedure: Right Heart Cath;  Surgeon: [name], MD;  Location: [facility];  Service: Cardiovascular;  Laterality: N/A;  . RIGHT HEART CATH N/A 07/12/2017   Procedure: RIGHT HEART CATH;  Surgeon: [name], MD;  Location: [facility];  Service: Cardiovascular;  Laterality: N/A;  . TONSILLECTOMY    . UPPER GASTROINTESTINAL ENDOSCOPY  03/15/2011   EGD ED BANDING/TCS  . UPPER GASTROINTESTINAL ENDOSCOPY  09/05/2010  . UPPER GASTROINTESTINAL ENDOSCOPY  08/11/2010  . VAGINAL HYSTERECTOMY        Social History:     Social History   Tobacco Use  . Smoking status: Never Smoker  . Smokeless tobacco: Never Used  Substance Use Topics  . Alcohol use: No    Alcohol/week: 0.0 standard drinks     Lives - at home  Mobility - walks by self   Family History :     Family History  Problem Relation Age of Onset  . Lung cancer Mother   . Diabetes Father   . Diabetes Sister   . Hypertension Sister   . Hypothyroidism Sister   . Colon cancer Brother   . Diabetes Sister   . Hypothyroidism Brother   . Healthy Daughter   . Obesity Daughter   . Hypertension Daughter        Home Medications:   Prior to Admission medications   Medication Sig Start Date End Date Taking? Authorizing Provider  busPIRone (BUSPAR) 5 MG tablet Take 5 mg by mouth 2 (two) times daily.   Yes [provider]  cholecalciferol (VITAMIN D) 1000 UNITS tablet Take 1,000 Units by mouth 3 (three) times a week.    Yes [provider]  Insulin Glargine (LANTUS SOLOSTAR) 100 UNIT/ML Solostar Pen Inject  38 Units daily at 10 pm into the skin. Patient taking differently: Inject 55 Units into the skin every morning.  07/22/17  Yes [name], [name], MD  lactulose (Edie) 10 GM/15ML solution TAKE 30 MLS BY MOUTH FOUR TIMES DAILY Patient taking differently: Take 20 g by mouth daily as needed for mild constipation or moderate constipation.  01/07/18  Yes Rehman, Mechele Dawley, MD  levothyroxine (SYNTHROID, LEVOTHROID) 25 MCG tablet Take 25 mcg by mouth daily before breakfast.    Yes [provider]  magnesium oxide (MAG-OX) 400 MG tablet Take 400 mg by mouth daily.    Yes [provider]  OXYGEN Inhale 2 L into the lungs daily as needed (FOR BREATHING).    Yes [provider]  pantoprazole (PROTONIX) 40 MG tablet Take 1 tablet by mouth once daily Patient taking differently: Take 40 mg by mouth daily.  02/06/17  Yes Rehman, Mechele Dawley, MD  PARoxetine (PAXIL) 40 MG tablet Take 40 mg by mouth every morning.   Yes [provider]  Pediatric Multivitamins-Iron (FLINTSTONES PLUS IRON PO) Take 2 tablets by mouth daily.    Yes [provider]  propranolol (INDERAL) 20 MG tablet Take 1 tablet by mouth twice a day Patient taking differently: Take 20 mg by mouth 2 (two) times daily.  06/03/18  Yes Setzer, Terri L, NP  rosuvastatin (CRESTOR) 5 MG tablet Take 1 tablet (5 mg total) by mouth every other day. 06/27/17 10/09/18 Yes [name], MD  spironolactone (ALDACTONE) 50 MG tablet Take 50 mg by mouth 2 (two) times daily.    Yes [provider]  HUMALOG 100 UNIT/ML injection INJECT 3 TO 10 UNITS SUBCUTANEOUSLY 3 TIMES DAILY PER SLIDING SCALE FOR 30 DAYS 07/01/18   [provider]  insulin aspart (NOVOLOG) 100 UNIT/ML FlexPen Use with given sliding scale  CBG 70-120: 0 units CBG 121-150: 1 unit CBG 151-200: 2 units CBG 201-250: 3 units CBG 251-300: 5 units CBG 301-350: 7 units CBG greater than 351: 9 units Patient taking differently: Inject  1-10 Units into the skin 3 (three) times daily with meals. Use with given sliding scale as directed with meals 07/22/17   [name], [name], MD  Insulin Pen Needle 31G X 5 MM MISC Use with each insulin injection 07/22/17   [name], [name], MD  torsemide (DEMADEX) 20 MG tablet Take 1 tablet (20 mg total) by mouth daily. 12/18/17   [name], MD     Allergies:     Allergies  Allergen Reactions  . Feraheme [Ferumoxytol] Other (See Comments)    Patient has severe back and chest pain when receiving FERAHEME infusions.  Marland Kitchen Ultram [Tramadol Hcl] Other (See Comments)    WEAKNESS     Physical Exam:   Vitals  Blood pressure (!) 111/57, pulse 83, temperature 97.9 F (36.6 C), temperature source Oral, resp. rate (!) 22, SpO2 97 %.   1. General lying in bed in NAD,    2. Normal affect and insight, Not Suicidal or Homicidal, Awake Alert, Oriented X 3.  3. No F.N deficits, ALL C.Nerves Intact, Strength 5/5 all 4 extremities, Sensation intact all 4 extremities, Plantars down going.  4. Ears and Eyes appear Normal, Conjunctivae pale, PERRLA. Moist Oral Mucosa.  5. Supple Neck, No JVD, No cervical lymphadenopathy appriciated, No Carotid Bruits.  6. Symmetrical Chest wall movement, Good air movement bilaterally, CTAB.  7. RRR, No Gallops, Rubs or Murmurs, No Parasternal Heave.  8. Positive Bowel Sounds, Abdomen Soft, No tenderness, No organomegaly appriciated,No rebound -guarding or rigidity.  9.  No Cyanosis, Normal Skin Turgor,  No Skin Rash or Bruise.  10. Good muscle tone,  joints appear normal , no effusions, Normal ROM.  11. No Palpable Lymph Nodes in Neck or Axillae     Data Review:    CBC Recent Labs  Lab 07/02/18 1736  WBC 8.6  HGB 7.0*  HCT 26.4*  PLT 92*  MCV 86.8  MCH 23.0*  MCHC 26.5*  RDW 20.3*  LYMPHSABS 0.9  MONOABS 0.7  EOSABS 0.2  BASOSABS 0.1    ------------------------------------------------------------------------------------------------------------------  Chemistries  Recent Labs  Lab 07/02/18 1736  NA 136  K 3.4*  CL 106  CO2 21*  GLUCOSE 138*  BUN 14  CREATININE 1.10*  CALCIUM 8.0*  AST 35  ALT 37  ALKPHOS 112  BILITOT 2.2*   ------------------------------------------------------------------------------------------------------------------ CrCl cannot be calculated (Unknown ideal weight.). ------------------------------------------------------------------------------------------------------------------ No results for input(s): TSH, T4TOTAL, T3FREE, THYROIDAB in the last 72 hours.  Invalid input(s): FREET3  Coagulation profile No results for input(s): INR, PROTIME in the last 168 hours. ------------------------------------------------------------------------------------------------------------------- No results for input(s): DDIMER in the last 72 hours. -------------------------------------------------------------------------------------------------------------------  Cardiac Enzymes No results for input(s): CKMB, TROPONINI, MYOGLOBIN in the last 168 hours.  Invalid input(s): CK ------------------------------------------------------------------------------------------------------------------    Component Value Date/Time   BNP 20.0 07/02/2018 1736     ---------------------------------------------------------------------------------------------------------------  Urinalysis    Component Value Date/Time   COLORURINE YELLOW 07/18/2017 0300   APPEARANCEUR CLEAR 07/18/2017 0300   LABSPEC 1.016 07/18/2017 0300   PHURINE 5.0 07/18/2017 0300   GLUCOSEU NEGATIVE 07/18/2017 0300   HGBUR NEGATIVE 07/18/2017 0300   BILIRUBINUR NEGATIVE 07/18/2017 0300   KETONESUR NEGATIVE 07/18/2017 0300   PROTEINUR NEGATIVE 07/18/2017 0300   NITRITE NEGATIVE 07/18/2017 0300   LEUKOCYTESUR NEGATIVE 07/18/2017 0300     ----------------------------------------------------------------------------------------------------------------   Imaging Results:    No results found.     Assessment & Plan:    Principal Problem:   Symptomatic anemia Active Problems:   Cirrhosis, nonalcoholic (HCC)   Diabetes mellitus (HCC)   Iron deficiency anemia   Thrombocytopenia (HCC)   CAD (coronary artery disease)   Hypothyroidism    Symptomatic anemia Transfuse 2 unit prbc Lasix 36m iv between units GI consultation due to black stool pepcid 259miv bid Iron def anemia, Thrombocytopenia Check ferritin, iron, tibc Check cbc in am  Cirrhosis, NASH w esophageal varices/ GAVE Cont Lactulose Cont Propranolol Cont Spironolactone Cont Torsemide  Hypokalemia Replete Check cmp in am  [employer] above  Dm2,  HOLD lantus since will be NPO in AM  FSBS ac and qhs, ISS  Hypothyroidism Cont levothyroxine     DVT Prophylaxis  SCDs  AM Labs Ordered, also please review Full Orders  Family Communication: Admission, patients condition and plan of care including tests being ordered have been discussed with the patient  who indicate understanding and agree with the plan and Code Status.  Code Status  FULL CODE  Likely DC to  home  Condition GUARDED    Consults called:  GI consult placed in computer  Admission status: observation, pt will be admitted for transfusion of blood for anemia as well as for black stool. Depending upon GI w/up may need inpatient status  Time spent in minutes : [id number] JaJani Gravel.D on 07/02/2018 at 7:10 PM  Between 7am to 7pm - Pager - [phone] After 7pm go to [email or web address] - password [email or web address] - Office  [phone]

## 2018-07-03 DIAGNOSIS — E039 Hypothyroidism, unspecified: Secondary | ICD-10-CM

## 2018-07-03 DIAGNOSIS — D696 Thrombocytopenia, unspecified: Secondary | ICD-10-CM

## 2018-07-03 DIAGNOSIS — D649 Anemia, unspecified: Secondary | ICD-10-CM

## 2018-07-03 DIAGNOSIS — K921 Melena: Principal | ICD-10-CM

## 2018-07-03 DIAGNOSIS — K7460 Unspecified cirrhosis of liver: Secondary | ICD-10-CM

## 2018-07-03 DIAGNOSIS — I2510 Atherosclerotic heart disease of native coronary artery without angina pectoris: Secondary | ICD-10-CM

## 2018-07-03 DIAGNOSIS — D500 Iron deficiency anemia secondary to blood loss (chronic): Secondary | ICD-10-CM

## 2018-07-03 LAB — COMPREHENSIVE METABOLIC PANEL
ALT: 31 U/L (ref 0–44)
AST: 31 U/L (ref 15–41)
Albumin: 2.4 g/dL — ABNORMAL LOW (ref 3.5–5.0)
Alkaline Phosphatase: 95 U/L (ref 38–126)
Anion gap: 8 (ref 5–15)
BUN: 16 mg/dL (ref 8–23)
CO2: 22 mmol/L (ref 22–32)
Calcium: 7.8 mg/dL — ABNORMAL LOW (ref 8.9–10.3)
Chloride: 104 mmol/L (ref 98–111)
Creatinine, Ser: 1.18 mg/dL — ABNORMAL HIGH (ref 0.44–1.00)
GFR calc Af Amer: 56 mL/min — ABNORMAL LOW (ref >60)
GFR calc non Af Amer: 48 mL/min — ABNORMAL LOW (ref >60)
Glucose, Bld: 189 mg/dL — ABNORMAL HIGH (ref 70–99)
Potassium: 3.5 mmol/L (ref 3.5–5.1)
Sodium: 134 mmol/L — ABNORMAL LOW (ref 135–145)
Total Bilirubin: 2.6 mg/dL — ABNORMAL HIGH (ref 0.3–1.2)
Total Protein: 5.7 g/dL — ABNORMAL LOW (ref 6.5–8.1)

## 2018-07-03 LAB — GLUCOSE, CAPILLARY
Glucose-Capillary: 147 mg/dL — ABNORMAL HIGH (ref 70–99)
Glucose-Capillary: 170 mg/dL — ABNORMAL HIGH (ref 70–99)
Glucose-Capillary: 158 mg/dL — ABNORMAL HIGH (ref 70–99)
Glucose-Capillary: 179 mg/dL — ABNORMAL HIGH (ref 70–99)

## 2018-07-03 LAB — CBC
HCT: 26.3 % — ABNORMAL LOW (ref 36.0–46.0)
Hemoglobin: 7.1 g/dL — ABNORMAL LOW (ref 12.0–15.0)
MCH: 23.9 pg — ABNORMAL LOW (ref 26.0–34.0)
MCHC: 27 g/dL — ABNORMAL LOW (ref 30.0–36.0)
MCV: 88.6 fL (ref 80.0–100.0)
Platelets: 56 10*3/uL — ABNORMAL LOW (ref 150–400)
RBC: 2.97 MIL/uL — ABNORMAL LOW (ref 3.87–5.11)
RDW: 19.1 % — ABNORMAL HIGH (ref 11.5–15.5)
WBC: 5 10*3/uL (ref 4.0–10.5)
nRBC: 0 % (ref 0.0–0.2)

## 2018-07-03 LAB — URINALYSIS, ROUTINE W REFLEX MICROSCOPIC
Bilirubin Urine: NEGATIVE
Glucose, UA: NEGATIVE mg/dL
Hgb urine dipstick: NEGATIVE
Ketones, ur: NEGATIVE mg/dL
Leukocytes, UA: NEGATIVE
Nitrite: NEGATIVE
Protein, ur: NEGATIVE mg/dL
Specific Gravity, Urine: 1.011 (ref 1.005–1.030)
pH: 5 (ref 5.0–8.0)

## 2018-07-03 MED ORDER — ACETAMINOPHEN 325 MG PO TABS
650.0000 mg | ORAL_TABLET | Freq: Once | ORAL | Status: AC
  Administered 2018-07-04: 650 mg via ORAL
  Filled 2018-07-03: qty 2

## 2018-07-03 MED ORDER — PANTOPRAZOLE SODIUM 40 MG PO TBEC
40.0000 mg | DELAYED_RELEASE_TABLET | Freq: Two times a day (BID) | ORAL | Status: DC
  Administered 2018-07-03 – 2018-07-07 (×9): 40 mg via ORAL
  Filled 2018-07-03 (×9): qty 1

## 2018-07-03 MED ORDER — SODIUM CHLORIDE 0.9% IV SOLUTION: Freq: Once | INTRAVENOUS | Status: DC

## 2018-07-03 MED ORDER — SODIUM CHLORIDE 0.9 % IV SOLN
INTRAVENOUS | Status: DC
  Administered 2018-07-04 – 2018-07-07 (×3): via INTRAVENOUS

## 2018-07-03 MED ORDER — ALUM & MAG HYDROXIDE-SIMETH 200-200-20 MG/5ML PO SUSP
30.0000 mL | Freq: Four times a day (QID) | ORAL | Status: DC | PRN
  Administered 2018-07-03 – 2018-07-07 (×4): 30 mL via ORAL
  Filled 2018-07-03 (×4): qty 30

## 2018-07-03 MED ORDER — PROPRANOLOL HCL 20 MG PO TABS
10.0000 mg | ORAL_TABLET | Freq: Every day | ORAL | Status: DC
  Administered 2018-07-04 – 2018-07-07 (×4): 10 mg via ORAL
  Filled 2018-07-03 (×4): qty 1

## 2018-07-03 NOTE — Progress Notes (Signed)
Inpatient Diabetes Program Recommendations  AACE/ADA: New Consensus Statement on Inpatient Glycemic Control (2015)  Target Ranges:  Prepandial:   less than 140 mg/dL      Peak postprandial:   less than 180 mg/dL (1-2 hours)      Critically ill patients:  140 - 180 mg/dL   Results for Maria [name], Maria [name] (MRN [id number]) as of 07/03/2018 06:52  Ref. Range 07/02/2018 20:16  Glucose-Capillary Latest Ref Range: 70 - 99 mg/dL 206 (H)   Admit with: Low Hemoglobin/ Hypokalemia  History: DM, CHF, NASH Cirrhosis  Home DM Meds: Lantus 55 units Daily           Humalog 3-12 units TID per SSI  Current Orders: None yet     MD- Please consider the following in-hospital insulin adjustments:  Start Novolog Sensitive Correction Scale/ SSI (0-9 units) Q4 hours  If you note that AM CBGs are elevated, may also consider starting a small portion of pt's home dose of Lantus.  Do not recommend drawing a current Hemoglobin A1c at this time since pt's Hemoglobin was so low on admission      --Will follow patient during hospitalization--  Wyn Quaker RN, MSN, CDE Diabetes Coordinator Inpatient Glycemic Control Team Team Pager: [phone] (8a-5p)

## 2018-07-03 NOTE — Progress Notes (Signed)
PROGRESS NOTE    [name]  DGL:[id number] DOB: [birth year] DOA: 07/02/2018 PCP: Glenda Chroman, MD    Brief Narrative:  Maria [name]  is a 62 y.o. female, w CAD, CHF, Dm2, Hypothyroidism, Anemia,  NASH cirrhosis, esophageal varices, GAVE, high-grade partial throbosis of portal vein,  apparently sent to ED by [facility] (where she is suppose to have TIPPS in the near future) due to Hgb 6.4 and black stool though she is on iron. + fatigue.  Pt denies fever, chills, cp, palp, sob, n/v, brbpr.   In Ed,  T 97.9, P 83, Bp 111/57  Pox 97%   Wbc 8.6, Hgb 7.0, Plt 92 Na 136, K3.4, Bun 14, Creatinine 1.10  Ast 35, Alt 37, Alk phos 112, T. Bili 2.2 BNP 20.0 Lipase 46  Pt will be admitted for symptomatic anemia and melena..    Assessment & Plan: 1-Symptomatic anemia: in the setting of acute on chronic GI bleed. -hx of cirrhosis and GAVE -continue PPI -EGD in am -follow Hgb trend -transfuse an extra unit  2-benign transfusion reaction -will pre-medicate with tylenol -give 1 more unit of PRBC's -follow Hgb trend  3-Cirrhosis, nonalcoholic (HCC) -continue torsemide and half dose inderal -continue Chronulac.   4-Diabetes mellitus ([place]) -continue SSI  5-Iron deficiency anemia -due to chronic GIB  6-Thrombocytopenia ([place]) -due to cirrhosis -low sodium diet discussed -will resume spironolactone and torsemide once BP stable.  7-hx of CAD (coronary artery disease) -continue statins  -continue metoprolol  8-Hypothyroidism -continue synthroid  9-anxiety -continue paxil and buspar  DVT prophylaxis: SCD's. Code Status: Full Family Communication: sister at bedside Disposition Plan: remains in the hospital in the setting of symptomatic anemia; follow GI recommendations, EGD in am. Transfuse 1 more unit.  Consultants:   GI service.  Procedures:   Planned EGD in am  Antimicrobials:  Anti-infectives (From admission, onward)   None      Subjective: Tired and  fatigue, feeling SOB with minimal exertion. Febrile during transfusion overnight.  Objective: Vitals:   07/03/18 0050 07/03/18 0215 07/03/18 0525 07/03/18 1851  BP: (!) 131/48 (!) 120/48 (!) 108/34 (!) 82/34  Pulse: (!) 104 91 84 78  Resp: _0 Temp: (!) 101.8 F (38.8 C) (!) 100.4 F (38 C) 99.2 F (37.3 C) 100.1 F (37.8 C)  TempSrc: Oral Oral Oral Oral  SpO2: 94% 100% 95% 96%  Weight:      Height:        Intake/Output Summary (Last 24 hours) at 07/03/2018 2125 Last data filed at 07/03/2018 1506 Gross per 24 hour  Intake 406.75 ml  Output 100 ml  Net 306.75 ml   Filed Weights   07/02/18 2000  Weight: 96.7 kg    Examination: General exam: Alert, awake, oriented x 3; reported to feel tired and extremely fatigue. Pale on examination and expressing SOB. Patient wearing 2L [place]. Spike fever overnight while receiving transfusion. Respiratory system: Clear to auscultation. Respiratory effort normal. Cardiovascular system:RRR. No murmurs, rubs, gallops. Gastrointestinal system: Abdomen is slightly distended and with mild positive fluid wave, no masses palpated, positive BS. Central nervous system: Alert and oriented. No focal neurological deficits. Extremities: No Cyanosis, no clubbing, trace edema bilaterally. Skin: No rashes, no petechiae, no open wounds. Psychiatry: Judgement and insight appear normal. Mood & affect appropriate.    Data Reviewed: I have personally reviewed following labs and imaging studies  CBC: Recent Labs  Lab 07/02/18 1736 07/03/18 0515  WBC 8.6 5.0  NEUTROABS 6.8  --  HGB 7.0* 7.1*  HCT 26.4* 26.3*  MCV 86.8 88.6  PLT 92* 56*   Basic Metabolic Panel: Recent Labs  Lab 07/02/18 1736 07/03/18 0515  NA 136 134*  K 3.4* 3.5  CL 106 104  CO2 21* 22  GLUCOSE 138* 189*  BUN 14 16  CREATININE 1.10* 1.18*  CALCIUM 8.0* 7.8*   GFR: Estimated Creatinine Clearance: 52.9 mL/min (A) (by C-G formula based on SCr of 1.18 mg/dL (H)).    Liver Function Tests: Recent Labs  Lab 07/02/18 1736 07/03/18 0515  AST 35 31  ALT 37 31  ALKPHOS 112 95  BILITOT 2.2* 2.6*  PROT 6.5 5.7*  ALBUMIN 2.7* 2.4*   Recent Labs  Lab 07/02/18 1736  LIPASE 46   CBG: Recent Labs  Lab 07/02/18 2016 07/03/18 0808 07/03/18 1149 07/03/18 1657 07/03/18 2109  GLUCAP 206* 147* 170* 158* 179*   Anemia Panel: Recent Labs    07/02/18 1752  FERRITIN 3*  TIBC 437  IRON 33   Urine analysis:    Component Value Date/Time   COLORURINE YELLOW 07/03/2018 0340   APPEARANCEUR CLEAR 07/03/2018 0340   LABSPEC 1.011 07/03/2018 0340   PHURINE 5.0 07/03/2018 0340   GLUCOSEU NEGATIVE 07/03/2018 0340   HGBUR NEGATIVE 07/03/2018 0340   BILIRUBINUR NEGATIVE 07/03/2018 0340   KETONESUR NEGATIVE 07/03/2018 0340   PROTEINUR NEGATIVE 07/03/2018 0340   NITRITE NEGATIVE 07/03/2018 0340   LEUKOCYTESUR NEGATIVE 07/03/2018 0340     Radiology Studies: No results found.      Scheduled Meds: . sodium chloride   Intravenous Once  . acetaminophen  650 mg Oral Once  . busPIRone  5 mg Oral BID  . cholecalciferol  1,000 Units Oral Once per day on Mon Wed Fri  . feeding supplement (PRO-STAT SUGAR FREE 64)  30 mL Oral BID  . levothyroxine  25 mcg Oral QAC breakfast  . magnesium oxide  400 mg Oral Daily  . pantoprazole  40 mg Oral BID  . PARoxetine  40 mg Oral Daily  . potassium chloride  10 mEq Oral BID  . [START ON 07/04/2018] propranolol  10 mg Oral Daily  . rosuvastatin  5 mg Oral QODAY  . sodium chloride flush  3 mL Intravenous Q12H  . torsemide  20 mg Oral Daily   Continuous Infusions: . sodium chloride    . sodium chloride    . famotidine (PEPCID) IV Stopped (07/03/18 0957)     LOS: 0 days    Time spent: 3o minutes.     Barton Dubois, MD [facility] Pager [phone]  If 7PM-7AM, please contact night-coverage [email or web address] Password [email or web address] 07/03/2018, 9:25 PM

## 2018-07-03 NOTE — Consult Note (Addendum)
Referring Provider: Barton Dubois, MD Primary Care Physician:  Glenda Chroman, MD Primary Gastroenterologist:  Dr. Hildred Laser  Reason for Consultation:  Anemia, melena  HPI: Maria [name] is a 62 y.o. female with history NAFLD cirrhosis, complicated by portal hypertension, esophageal varices with GAVE, hepatic encephalopathy.  She has nonocclusive portal vein thrombosis that has extended to her SMV and concerning for affecting her transplant status.  Poor candidate for anticoagulation due to varices with GAVE.  Plans for TIPS due to nonocclusive thrombus in her portal vein, plans for concurrent embolization of retroperitoneal varices.    Patient was seen at the transplant center at [facility] yesterday, note unavailable at this time.  Labs showed hemoglobin is 6.4, down from 9.2 three weeks ago.  Albumin 2.7, total bilirubin 1.9, AST 45, ALT 42, alkaline phosphatase 110, INR 1.49.  She was advised to come to the ED after her local gastroenterologist office (Dr. Laural Golden) had been contacted regarding the labs and the patient had complained of feeling dizzy, shortness of breath.  In the ED her hemoglobin was 7, MCV 26.4, platelets 92,000, ferritin 3, iron saturations 8%, iron 33, TIBC 437.  She received 1 unit of packed red blood last night, developed low-grade fever in the second unit was held.  Today her hemoglobin is 7.1 essentially unchanged posttransfusion.  Her platelets are 56,000.  Last imaging via MRI abdomen with and without contrast at [facility] July 2019 showed similar appearance of nonocclusive main portal vein thrombosis extending to the level of the portal confluence, small caliber paraesophageal and gastrohepatic varices.  Large caliber retroperitoneal varices.  These again appear to communicate with the main portal vein at the level of the portal confluence.  Liver is cirrhotic.  No enhancing or washout lesions.  No biliary dilation.  Spleen measuring 20.6 cm.  Pancreas unremarkable.  EGD by  Dr. Laural Golden in February 2019 (last endoscopy) - Normal proximal esophagus. - Scar in the mid esophagus and in the distal esophagus. No evidence of recurrent varices. - Z-line regular, 33 cm from the incisors. - 2 cm hiatal hernia. - Portal hypertensive gastropathy. - Gastric antral vascular ectasia with bleeding. Treated with argon plasma coagulation (APC). All of the lesions were not treated. - Mucosal changes in the duodenum. - No specimens collected. -Recommend repeat EGD in 4 weeks.   Last colonoscopy July 2017, Dr. Laural Golden: -A few small angiodysplastic lesions without bleeding were found in the sigmoid colon, in the descending colon and in the transverse colon.  -A 4 mm polyp was found in the sigmoid colon. The polyp was sessile. The polyp was removed with a cold snare. Resection and retrieval were complete. -External hemorrhoids were found during retroflexion. The hemorrhoids were small. -followed up with virtual colonoscopy 05/2016 unremarkable colon.   Givens Capsule study 07/05/17, Dr. Laural Golden -Portal hypertensive gastropathy and enteropathy. -GAVE with stigmata of active bleeding. -Source of blood in the duodenum and coffee-ground material in distal small bowel and proximal colon appears to be originating from the stomach.  Patient reports melena couple of times per week for the past couple of weeks. No brbpr. No abd pain. Some heartburn having to take tums as well. No dysphagia. Feels very fatigued. DOE. No chest pain. No recent confusion. Some epigastric tenderness off and on. None today. She developed temp of 101.8 with transfusion. Transfusion reaction testing being performed. Previously did not tolerate IV iron infusions.    Prior to Admission medications   Medication Sig Start Date End Date Taking? Authorizing  Provider  busPIRone (BUSPAR) 5 MG tablet Take 5 mg by mouth 2 (two) times daily.   Yes [provider]  cholecalciferol (VITAMIN D) 1000 UNITS tablet Take  1,000 Units by mouth 3 (three) times a week.    Yes [provider]  HUMALOG 100 UNIT/ML injection Inject 3-12 Units into the skin 3 (three) times daily with meals.  07/01/18  Yes [provider]  Insulin Glargine (LANTUS SOLOSTAR) 100 UNIT/ML Solostar Pen Inject 38 Units daily at 10 pm into the skin. Patient taking differently: Inject 55 Units into the skin every morning.  07/22/17  Yes [name], [name], MD  lactulose ([facility]) 10 GM/15ML solution TAKE 30 MLS BY MOUTH FOUR TIMES DAILY Patient taking differently: Take 20 g by mouth daily as needed for mild constipation or moderate constipation.  01/07/18  Yes Rehman, Mechele Dawley, MD  levothyroxine (SYNTHROID, LEVOTHROID) 25 MCG tablet Take 25 mcg by mouth daily before breakfast.    Yes [provider]  magnesium oxide (MAG-OX) 400 MG tablet Take 400 mg by mouth daily.    Yes [provider]  OXYGEN Inhale 2 L into the lungs daily as needed (FOR BREATHING).    Yes [provider]  pantoprazole (PROTONIX) 40 MG tablet Take 1 tablet by mouth once daily Patient taking differently: Take 40 mg by mouth daily.  02/06/17  Yes Rehman, Mechele Dawley, MD  PARoxetine (PAXIL) 40 MG tablet Take 40 mg by mouth every morning.   Yes [provider]  Pediatric Multivitamins-Iron (FLINTSTONES PLUS IRON PO) Take 2 tablets by mouth daily.    Yes [provider]  potassium chloride (K-DUR) 10 MEQ tablet Take 10 mEq by mouth 2 (two) times daily.  03/26/18  Yes [provider]  propranolol (INDERAL) 20 MG tablet Take 1 tablet by mouth twice a day Patient taking differently: Take 10 mg by mouth 2 (two) times daily.  06/03/18  Yes Setzer, Terri L, NP  rosuvastatin (CRESTOR) 5 MG tablet Take 1 tablet (5 mg total) by mouth every other day. 06/27/17 10/09/18 Yes [name], MD  spironolactone (ALDACTONE) 50 MG tablet Take 50 mg by mouth 2 (two) times daily.    Yes [provider]  torsemide  (DEMADEX) 20 MG tablet Take 1 tablet (20 mg total) by mouth daily. 12/18/17  Yes [name], MD  trimethoprim-polymyxin b (POLYTRIM) ophthalmic solution Place 1 drop into the left eye See admin instructions. Use 3 to 4 times daily for 2 days following monthly eye injection 05/06/18  Yes [provider]  Insulin Pen Needle 31G X 5 MM MISC Use with each insulin injection 07/22/17   [name], [name], MD    Current Facility-Administered Medications  Medication Dose Route Frequency Provider Last Rate Last Dose  . 0.9 %  sodium chloride infusion  10 mL/hr Intravenous Once Carmin Muskrat, MD      . 0.9 %  sodium chloride infusion  250 mL Intravenous PRN Jani Gravel, MD      . busPIRone (BUSPAR) tablet 5 mg  5 mg Oral BID Jani Gravel, MD   5 mg at 07/02/18 2221  . cholecalciferol (VITAMIN D) tablet 1,000 Units  1,000 Units Oral Once per day on Mon Wed Fri Kim, James, MD      . famotidine (PEPCID) IVPB 20 mg premix  20 mg Intravenous Marjean Donna, MD 100 mL/hr at 07/03/18 0124 20 mg at 07/03/18 0124  . feeding supplement (PRO-STAT SUGAR FREE  64) liquid 30 mL  30 mL Oral BID Jani Gravel, MD   30 mL at 07/02/18 2221  . Influenza vac split quadrivalent PF (FLUARIX) injection 0.5 mL  0.5 mL Intramuscular Tomorrow-1000 Jani Gravel, MD      . lactulose ([place]) 10 GM/15ML solution 20 g  20 g Oral Daily PRN Jani Gravel, MD      . levothyroxine (SYNTHROID, LEVOTHROID) tablet 25 mcg  25 mcg Oral QAC breakfast Jani Gravel, MD      . magnesium oxide (MAG-OX) tablet 400 mg  400 mg Oral Daily Jani Gravel, MD   400 mg at 07/02/18 2221  . PARoxetine (PAXIL) tablet 40 mg  40 mg Oral Daily Jani Gravel, MD      . pneumococcal 23 valent vaccine (PNU-IMMUNE) injection 0.5 mL  0.5 mL Intramuscular Tomorrow-1000 Jani Gravel, MD      . potassium chloride (K-DUR,KLOR-CON) CR tablet 10 mEq  10 mEq Oral BID Jani Gravel, MD   10 mEq at 07/02/18 2221  . propranolol (INDERAL) tablet 10 mg  10 mg Oral BID Jani Gravel, MD   10 mg at 07/03/18 0129  . rosuvastatin (CRESTOR) tablet 5 mg  5 mg Oral Oretha Milch, MD      . sodium chloride flush (NS) 0.9 % injection 3 mL  3 mL Intravenous Q12H Jani Gravel, MD   3 mL at 07/03/18 0131  . sodium chloride flush (NS) 0.9 % injection 3 mL  3 mL Intravenous PRN Jani Gravel, MD      . spironolactone (ALDACTONE) tablet 50 mg  50 mg Oral BID Jani Gravel, MD      . torsemide [facility]) tablet 20 mg  20 mg Oral Daily Jani Gravel, MD        Allergies as of 07/02/2018 - Review Complete 07/02/2018  Allergen Reaction Noted  . Feraheme [ferumoxytol] Other (See Comments) 07/08/2017  . Ultram [tramadol hcl] Other (See Comments) 07/21/2017    Past Medical History:  Diagnosis Date  . Anemia   . CHF (congestive heart failure) ([place])   . Cirrhosis ([place])   . Depression   . Diabetes mellitus   . Esophageal bleed, non-variceal   . Eye hemorrhage    Behind left eye  . Fibromyalgia   . Hypercholesteremia   . Hypertension   . Hypothyroidism   . NAFLD (nonalcoholic fatty liver disease)   . Osteoarthrosis   . Osteoporosis   . PONV (postoperative nausea and vomiting)   . UTI (urinary tract infection) 11/12 end of the month   Patient feels that she passed a kidney stone at that time    Past Surgical History:  Procedure Laterality Date  . APPENDECTOMY  1980  . BILATERAL SALPINGOOPHORECTOMY    . CHOLECYSTECTOMY    . COLONOSCOPY  03/15/2011  . COLONOSCOPY N/A 03/24/2016   Procedure: COLONOSCOPY;  Surgeon: [name], MD;  [facility];  Service: Endoscopy;  Laterality: N/A;  855  . ESOPHAGEAL BANDING  04/24/2012   Procedure: ESOPHAGEAL BANDING;  Surgeon: [name], MD;  [facility];  Service: Endoscopy;  Laterality: N/A;  . Esophageal BANDING  08/03/2012   [facility] in [place] , [place]  09/24/2012   Procedure: ESOPHAGEAL BANDING;  Surgeon: [name], MD;  Location: [facility];  Service: Endoscopy;   Laterality: N/A;  Banding x 3  . ESOPHAGEAL BANDING N/A 01/29/2013   Procedure: ESOPHAGEAL BANDING;  Surgeon: [name],  MD;  [facility];  Service: Endoscopy;  Laterality: N/A;  . ESOPHAGEAL BANDING N/A 06/19/2014   Procedure: ESOPHAGEAL BANDING;  Surgeon: [name], MD;  [facility];  Service: Endoscopy;  Laterality: N/A;  . ESOPHAGEAL BANDING N/A 01/05/2016   Procedure: ESOPHAGEAL BANDING;  Surgeon: [name], MD;  [facility];  Service: Endoscopy;  Laterality: N/A;  . ESOPHAGEAL BANDING N/A 08/03/2016   Procedure: ESOPHAGEAL BANDING;  Surgeon: [name], MD;  [facility];  Service: Endoscopy;  Laterality: N/A;  . ESOPHAGEAL BANDING N/A 05/02/2017   Procedure: ESOPHAGEAL BANDING;  Surgeon: [name], MD;  [facility];  Service: Endoscopy;  Laterality: N/A;  . ESOPHAGOGASTRODUODENOSCOPY  04/24/2012   Procedure: ESOPHAGOGASTRODUODENOSCOPY (EGD);  Surgeon: [name], MD;  [facility];  Service: Endoscopy;  Laterality: N/A;  300  . ESOPHAGOGASTRODUODENOSCOPY N/A 01/29/2013   Procedure: ESOPHAGOGASTRODUODENOSCOPY (EGD);  Surgeon: [name], MD;  [facility];  Service: Endoscopy;  Laterality: N/A;  1200  . ESOPHAGOGASTRODUODENOSCOPY N/A 05/22/2013   Procedure: ESOPHAGOGASTRODUODENOSCOPY (EGD);  Surgeon: [name], MD;  [facility];  Service: Endoscopy;  Laterality: N/A;  1:55  . ESOPHAGOGASTRODUODENOSCOPY N/A 12/31/2013   Procedure: ESOPHAGOGASTRODUODENOSCOPY (EGD);  Surgeon: [name], MD;  [facility];  Service: Endoscopy;  Laterality: N/A;  1200  . ESOPHAGOGASTRODUODENOSCOPY N/A 06/19/2014   Procedure: ESOPHAGOGASTRODUODENOSCOPY (EGD);  Surgeon: [name], MD;  [facility];  Service: Endoscopy;  Laterality: N/A;  1055  . ESOPHAGOGASTRODUODENOSCOPY N/A 01/15/2015   Procedure: ESOPHAGOGASTRODUODENOSCOPY (EGD);  Surgeon: [name], MD;  [facility];  Service: Endoscopy;  Laterality: N/A;  730 - moved to 9:45 - moved to 1250-Ann notified pt  . ESOPHAGOGASTRODUODENOSCOPY N/A 06/30/2015   Procedure: ESOPHAGOGASTRODUODENOSCOPY (EGD);  Surgeon: [name], MD;  [facility];  Service: Endoscopy;  Laterality: N/A;  1200  . ESOPHAGOGASTRODUODENOSCOPY N/A 01/05/2016   Procedure: ESOPHAGOGASTRODUODENOSCOPY (EGD);  Surgeon: [name], MD;  [facility];  Service: Endoscopy;  Laterality: N/A;  830  . ESOPHAGOGASTRODUODENOSCOPY N/A 08/03/2016   Procedure: ESOPHAGOGASTRODUODENOSCOPY (EGD);  Surgeon: [name], MD;  [facility];  Service: Endoscopy;  Laterality: N/A;  930  . ESOPHAGOGASTRODUODENOSCOPY N/A 05/02/2017   Procedure: ESOPHAGOGASTRODUODENOSCOPY (EGD);  Surgeon: [name], MD;  [facility];  Service: Endoscopy;  Laterality: N/A;  12:00  . ESOPHAGOGASTRODUODENOSCOPY N/A 08/17/2017   Procedure: ESOPHAGOGASTRODUODENOSCOPY (EGD);  Surgeon: [name], MD;  [facility];  Service: Endoscopy;  Laterality: N/A;  8:15  . ESOPHAGOGASTRODUODENOSCOPY N/A 09/12/2017   Procedure: ESOPHAGOGASTRODUODENOSCOPY (EGD);  Surgeon: [name], MD;  [facility];  Service: Endoscopy;  Laterality: N/A;  1040  . ESOPHAGOGASTRODUODENOSCOPY N/A 10/10/2017   Procedure: ESOPHAGOGASTRODUODENOSCOPY (EGD);  Surgeon: [name], MD;  [facility];  Service: Endoscopy;  Laterality: N/A;  225  . ESOPHAGOGASTRODUODENOSCOPY N/A 11/09/2017   Procedure: ESOPHAGOGASTRODUODENOSCOPY (EGD);  Surgeon: [name], MD;  [facility];  Service: Endoscopy;  Laterality: N/A;  . ESOPHAGOGASTRODUODENOSCOPY (EGD) WITH PROPOFOL  09/24/2012   Procedure: ESOPHAGOGASTRODUODENOSCOPY (EGD) WITH PROPOFOL;  Surgeon: [name], MD;  [facility]: [facility];  Service: Endoscopy;  Laterality: N/A;  GE junction at 36  .  ESOPHAGOGASTRODUODENOSCOPY (EGD) WITH PROPOFOL N/A 07/06/2017   Procedure: ESOPHAGOGASTRODUODENOSCOPY (EGD) WITH PROPOFOL;  Surgeon: [name], MD;  [facility];  Service: Endoscopy;  Laterality: N/A;  . ESOPHAGOGASTRODUODENOSCOPY W/ BANDING  08/2010  . GIVENS CAPSULE STUDY N/A 07/05/2017   Procedure: GIVENS CAPSULE STUDY;  Surgeon: [name], MD;  [facility];  Service: Endoscopy;  Laterality: N/A;  . HOT HEMOSTASIS N/A 08/17/2017   Procedure: HOT HEMOSTASIS (ARGON PLASMA COAGULATION/BICAP);  Surgeon: [name], MD;  [facility];  Service: Endoscopy;  Laterality: N/A;  . HOT HEMOSTASIS  09/12/2017   Procedure: HOT HEMOSTASIS (ARGON PLASMA COAGULATION/BICAP);  Surgeon: [name], MD;  [facility];  Service: Endoscopy;;  gastric  . HOT HEMOSTASIS  10/10/2017   Procedure: HOT HEMOSTASIS (ARGON PLASMA COAGULATION/BICAP);  Surgeon: [name], MD;  [facility];  Service: Endoscopy;;  . POLYPECTOMY  03/24/2016   Procedure: POLYPECTOMY;  Surgeon: [name], MD;  [facility];  Service: Endoscopy;;  sigmoid polyp  . RIGHT HEART CATH N/A 04/16/2017   Procedure: Right Heart Cath;  Surgeon: [name], MD;  Location: [facility];  Service: Cardiovascular;  Laterality: N/A;  . RIGHT HEART CATH N/A 07/12/2017   Procedure: RIGHT HEART CATH;  Surgeon: [name], MD;  Location: [facility];  Service: Cardiovascular;  Laterality: N/A;  . TONSILLECTOMY    . UPPER GASTROINTESTINAL ENDOSCOPY  03/15/2011   EGD ED BANDING/TCS  . UPPER GASTROINTESTINAL ENDOSCOPY  09/05/2010  . UPPER GASTROINTESTINAL ENDOSCOPY  08/11/2010  . VAGINAL HYSTERECTOMY      Family History  Problem Relation Age of Onset  . Lung cancer Mother   . Diabetes Father   . Diabetes Sister   . Hypertension Sister   . Hypothyroidism Sister   . Colon cancer Brother   . Diabetes Sister   . Hypothyroidism  Brother   . Healthy Daughter   . Obesity Daughter   . Hypertension Daughter     Social History   Socioeconomic History  . Marital status: Married    Spouse name: Not on file  . Number of children: Not on file  . Years of education: Not on file  . Highest education level: Not on file  Occupational History  . Occupation: retired   Scientific laboratory technician  . Financial resource strain: Not very hard  . Food insecurity:    Worry: Never true    Inability: Never true  . Transportation needs:    Medical: No    Non-medical: No  Tobacco Use  . Smoking status: Never Smoker  . Smokeless tobacco: Never Used  Substance and Sexual Activity  . Alcohol use: No    Alcohol/week: 0.0 standard drinks  . Drug use: No  . Sexual activity: Not on file  Lifestyle  . Physical activity:    Days per week: 0 days    Minutes per session: 0 min  . Stress: Very much  Relationships  . Social connections:    Talks on phone: Not on file    Gets together: Not on file    Attends religious service: Not on file    Active member of club or organization: Not on file    Attends meetings of clubs or organizations: Not on file    Relationship status: Not on file  . Intimate partner violence:    Fear of current or ex partner: Not on file    Emotionally abused: Not on file    Physically abused: Not on file    Forced sexual activity: Not on file  Other Topics Concern  . Not on file  Social History Narrative   Lives with husband      ROS:  General: Negative for anorexia, weight loss, fever, chills,+fatigue, +weakness. Eyes: Negative for vision changes.  ENT: Negative for hoarseness, difficulty swallowing , nasal congestion. CV: Negative for chest pain, angina, palpitations,+ dyspnea on exertion,no  peripheral edema.  Respiratory: Negative for dyspnea at rest, dyspnea on exertion, cough, sputum, wheezing.  GI: See history of present illness. GU:  Negative for dysuria, hematuria, urinary incontinence, urinary  frequency, nocturnal urination.  MS: Negative for joint pain, low back pain.  Derm: Negative for rash or itching.  Neuro: Negative for weakness, abnormal sensation, seizure, frequent headaches, memory loss, confusion.  Psych: Negative for anxiety, depression, suicidal ideation, hallucinations.  Endo: Negative for unusual weight change.  Heme: Negative for bruising or bleeding. Allergy: Negative for rash or hives.       Physical Examination: Vital signs in last 24 hours: Temp:  [97.9 F (36.6 C)-101.8 F (38.8 C)] 99.2 F (37.3 C) (10/16 0525) Pulse Rate:  [80-104] 84 (10/16 0525) Resp:  [14-26] 20 (10/16 0525) BP: (59-135)/(28-65) 108/34 (10/16 0525) SpO2:  [94 %-100 %] 95 % (10/16 0525) Weight:  [96.7 kg] 96.7 kg (10/15 2000) Last BM Date: 07/02/18  General:chronically ill appearing female. in no acute distress.  Head: Normocephalic, atraumatic.   Eyes: Conjunctiva pink, no icterus. Mouth: Oropharyngeal mucosa moist and pink , no lesions erythema or exudate. Neck: Supple without thyromegaly, masses, or lymphadenopathy.  Lungs: Clear to auscultation bilaterally.  Heart: Regular rate and rhythm, no murmurs rubs or gallops.  Abdomen: Bowel sounds are normal, slightly distended but soft. nontender.   Rectal: not performed Extremities: No lower extremity edema, clubbing, deformity.  Neuro: Alert and oriented x 4 , grossly normal neurologically.  Skin: Warm and dry, no rash or jaundice.   Psych: Alert and cooperative, normal mood and affect.        Intake/Output from previous day: 10/15 0701 - 10/16 0700 In: 315 [Blood:315] Out: 100 [Urine:100] Intake/Output this shift: No intake/output data recorded.  Lab Results: CBC Recent Labs    07/02/18 1736 07/03/18 0515  WBC 8.6 5.0  HGB 7.0* 7.1*  HCT 26.4* 26.3*  MCV 86.8 88.6  PLT 92* 56*   BMET Recent Labs    07/02/18 1736 07/03/18 0515  NA 136 134*  K 3.4* 3.5  CL 106 104  CO2 21* 22  GLUCOSE 138* 189*  BUN 14  16  CREATININE 1.10* 1.18*  CALCIUM 8.0* 7.8*   LFT Recent Labs    07/02/18 1736 07/03/18 0515  BILITOT 2.2* 2.6*  ALKPHOS 112 95  AST 35 31  ALT 37 31  PROT 6.5 5.7*  ALBUMIN 2.7* 2.4*    Lipase Recent Labs    07/02/18 1736  LIPASE 46    PT/INR No results for input(s): LABPROT, INR in the last 72 hours.    Imaging Studies: No results found.Minnie.Brome week]   Impression: 62 y/o female with history of NAFLD cirrhosis, complicated by portal hypertension, esophageal varices with GAVE, hepatic encephalopathy.  She has nonocclusive portal vein thrombosis that has extended to her SMV and concerning for affecting her transplant status.  Poor candidate for anticoagulation due to varices with GAVE.  Plans for TIPS due to nonocclusive thrombus in her portal vein, plans for concurrent embolization of retroperitoneal varices, scheduled for 07/08/18.  Presented with significant drop in Hgb, symptomatic anemia with reported melena off/on for couple of weeks.  She developed temp towards end of first unit of prbcs last night and second unit was cancelled. Temp is down this morning to 99.2. Hgb 7. No melena since admission. Suspect ongoing bleeding from GAVE. Patient was supposed to have additional treatment in 11/2017. Last EGD 10/2017 as outlined above.    Plan: 1. EGD with Dr. Gala Romney, possibly today or tomorrow.  I have discussed the risks, alternatives, benefits with regards to but not limited to the risk of reaction to medication, bleeding, infection, perforation and the patient is agreeable to proceed. Written consent to be obtained. 2. Patient could use additional blood given symptomatic anemia. Work of for possible transfusion reaction underway. She previously did not tolerate iron infusions (Feraheme) on couple of occasions.   We would like to thank you for the opportunity to participate in the care of Jerilee Field.  Laureen Ochs. Bernarda Caffey [facility] [phone] 10/16/201910:22 AM  Addendum: Discussed with Dr. Gala Romney.  Given patient developed fever with blood transfusion, transfusion reaction work-up pending, potential need for additional blood, will hold off on endoscopy until tomorrow.  I will reach out with pathologist regarding transfusion reaction work-up and possible future transfusion needs perioperatively.  Laureen Ochs. [facility] [phone] 10/16/201910:50 AM

## 2018-07-03 NOTE — Progress Notes (Signed)
Reviewed transfusion reaction work up. DAT IgG, DAT C3 and path interpretation negative. I spoke with pathologist, Dr. Saralyn Pilar and looks like benign febrile reaction. Consider premedicating with tylenol/bendadryl prior to future transfusions.   We will recheck CBC in morning.  Plan for EGD with propofol tomorrow.   Laureen Ochs. Bernarda Caffey [facility] [phone] 10/16/20194:14 PM

## 2018-07-03 NOTE — Care Management Obs Status (Signed)
[place] NOTIFICATION   Patient Details  Name: Maria [name] MRN: [id number] Date of Birth: 1955/10/28   Medicare Observation Status Notification Given:  Yes    Shelda Altes 07/03/2018, 3:27 PM

## 2018-07-03 NOTE — Plan of Care (Addendum)
Notified MD in change in patient's temperature at end of blood administration. MD states to hold second unit of blood and provide cooling measures for patient.

## 2018-07-04 ENCOUNTER — Encounter (HOSPITAL_COMMUNITY)
Admission: EM | Disposition: A | Discharge status: Other Acute Inpatient Hospital | Payer: Self-pay | Source: Home / Self Care | Attending: Internal Medicine

## 2018-07-04 DIAGNOSIS — Z833 Family history of diabetes mellitus: Secondary | ICD-10-CM | POA: Diagnosis not present

## 2018-07-04 DIAGNOSIS — I81 Portal vein thrombosis: Secondary | ICD-10-CM | POA: Diagnosis present

## 2018-07-04 DIAGNOSIS — Z8249 Family history of ischemic heart disease and other diseases of the circulatory system: Secondary | ICD-10-CM | POA: Diagnosis not present

## 2018-07-04 DIAGNOSIS — I8510 Secondary esophageal varices without bleeding: Secondary | ICD-10-CM | POA: Diagnosis present

## 2018-07-04 DIAGNOSIS — Z23 Encounter for immunization: Secondary | ICD-10-CM | POA: Diagnosis present

## 2018-07-04 DIAGNOSIS — Z79890 Hormone replacement therapy (postmenopausal): Secondary | ICD-10-CM | POA: Diagnosis not present

## 2018-07-04 DIAGNOSIS — E7800 Pure hypercholesterolemia, unspecified: Secondary | ICD-10-CM | POA: Diagnosis present

## 2018-07-04 DIAGNOSIS — Z794 Long term (current) use of insulin: Secondary | ICD-10-CM | POA: Diagnosis not present

## 2018-07-04 DIAGNOSIS — Z79899 Other long term (current) drug therapy: Secondary | ICD-10-CM | POA: Diagnosis not present

## 2018-07-04 DIAGNOSIS — K7460 Unspecified cirrhosis of liver: Secondary | ICD-10-CM | POA: Diagnosis present

## 2018-07-04 DIAGNOSIS — K921 Melena: Secondary | ICD-10-CM | POA: Diagnosis present

## 2018-07-04 DIAGNOSIS — K766 Portal hypertension: Secondary | ICD-10-CM | POA: Diagnosis present

## 2018-07-04 DIAGNOSIS — I2510 Atherosclerotic heart disease of native coronary artery without angina pectoris: Secondary | ICD-10-CM | POA: Diagnosis present

## 2018-07-04 DIAGNOSIS — D649 Anemia, unspecified: Secondary | ICD-10-CM | POA: Diagnosis present

## 2018-07-04 DIAGNOSIS — D500 Iron deficiency anemia secondary to blood loss (chronic): Secondary | ICD-10-CM | POA: Diagnosis not present

## 2018-07-04 DIAGNOSIS — M810 Age-related osteoporosis without current pathological fracture: Secondary | ICD-10-CM | POA: Diagnosis present

## 2018-07-04 DIAGNOSIS — E039 Hypothyroidism, unspecified: Secondary | ICD-10-CM | POA: Diagnosis present

## 2018-07-04 DIAGNOSIS — D696 Thrombocytopenia, unspecified: Secondary | ICD-10-CM | POA: Diagnosis present

## 2018-07-04 DIAGNOSIS — Y848 Other medical procedures as the cause of abnormal reaction of the patient, or of later complication, without mention of misadventure at the time of the procedure: Secondary | ICD-10-CM | POA: Diagnosis not present

## 2018-07-04 DIAGNOSIS — K7581 Nonalcoholic steatohepatitis (NASH): Secondary | ICD-10-CM | POA: Diagnosis present

## 2018-07-04 DIAGNOSIS — M797 Fibromyalgia: Secondary | ICD-10-CM | POA: Diagnosis present

## 2018-07-04 DIAGNOSIS — D509 Iron deficiency anemia, unspecified: Secondary | ICD-10-CM | POA: Diagnosis present

## 2018-07-04 DIAGNOSIS — K219 Gastro-esophageal reflux disease without esophagitis: Secondary | ICD-10-CM | POA: Diagnosis present

## 2018-07-04 DIAGNOSIS — E876 Hypokalemia: Secondary | ICD-10-CM | POA: Diagnosis present

## 2018-07-04 DIAGNOSIS — F419 Anxiety disorder, unspecified: Secondary | ICD-10-CM | POA: Diagnosis present

## 2018-07-04 DIAGNOSIS — T8089XA Other complications following infusion, transfusion and therapeutic injection, initial encounter: Secondary | ICD-10-CM | POA: Diagnosis not present

## 2018-07-04 DIAGNOSIS — Z6841 Body Mass Index (BMI) 40.0 and over, adult: Secondary | ICD-10-CM | POA: Diagnosis not present

## 2018-07-04 DIAGNOSIS — K31819 Angiodysplasia of stomach and duodenum without bleeding: Secondary | ICD-10-CM | POA: Diagnosis present

## 2018-07-04 LAB — CBC
HCT: 21.5 % — ABNORMAL LOW (ref 36.0–46.0)
Hemoglobin: 6.2 g/dL — CL (ref 12.0–15.0)
MCH: 24.7 pg — ABNORMAL LOW (ref 26.0–34.0)
MCHC: 28.8 g/dL — ABNORMAL LOW (ref 30.0–36.0)
MCV: 85.7 fL (ref 80.0–100.0)
Platelets: 39 10*3/uL — ABNORMAL LOW (ref 150–400)
RBC: 2.51 MIL/uL — ABNORMAL LOW (ref 3.87–5.11)
RDW: 19 % — ABNORMAL HIGH (ref 11.5–15.5)
WBC: 4.6 10*3/uL (ref 4.0–10.5)
nRBC: 0 % (ref 0.0–0.2)

## 2018-07-04 LAB — GLUCOSE, CAPILLARY
Glucose-Capillary: 167 mg/dL — ABNORMAL HIGH (ref 70–99)
Glucose-Capillary: 232 mg/dL — ABNORMAL HIGH (ref 70–99)
Glucose-Capillary: 226 mg/dL — ABNORMAL HIGH (ref 70–99)
Glucose-Capillary: 247 mg/dL — ABNORMAL HIGH (ref 70–99)

## 2018-07-04 LAB — HEMOGLOBIN AND HEMATOCRIT, BLOOD
HCT: 23.7 % — ABNORMAL LOW (ref 36.0–46.0)
Hemoglobin: 6.7 g/dL — CL (ref 12.0–15.0)

## 2018-07-04 LAB — PREPARE RBC (CROSSMATCH)

## 2018-07-04 SURGERY — ESOPHAGOGASTRODUODENOSCOPY (EGD) WITH PROPOFOL
Anesthesia: Choice

## 2018-07-04 MED ORDER — SODIUM CHLORIDE 0.9 % IV BOLUS
500.0000 mL | Freq: Once | INTRAVENOUS | Status: AC
  Administered 2018-07-04: 500 mL via INTRAVENOUS

## 2018-07-04 MED ORDER — FUROSEMIDE 10 MG/ML IJ SOLN
20.0000 mg | Freq: Once | INTRAMUSCULAR | Status: DC
  Filled 2018-07-04 (×2): qty 2

## 2018-07-04 MED ORDER — ACETAMINOPHEN 325 MG PO TABS
325.0000 mg | ORAL_TABLET | Freq: Once | ORAL | Status: AC
  Administered 2018-07-04: 325 mg via ORAL
  Filled 2018-07-04: qty 1

## 2018-07-04 MED ORDER — ACETAMINOPHEN 325 MG PO TABS
650.0000 mg | ORAL_TABLET | Freq: Once | ORAL | Status: AC
  Administered 2018-07-04: 650 mg via ORAL
  Filled 2018-07-04: qty 2

## 2018-07-04 MED ORDER — SODIUM CHLORIDE 0.9% IV SOLUTION: Freq: Once | INTRAVENOUS | Status: DC

## 2018-07-04 NOTE — Progress Notes (Signed)
Subjective:  Patient complains of fatigue, DOE, lightheadedness with standing.  Denies abdominal pain.  Objective: Vital signs in last 24 hours: Temp:  [98.3 F (36.8 C)-101.4 F (38.6 C)] 98.3 F (36.8 C) (10/17 0700) Pulse Rate:  [66-81] 66 (10/17 0700) Resp:  [15-20] 19 (10/17 0700) BP: (82-118)/(34-52) 112/48 (10/17 0700) SpO2:  [95 %-99 %] 99 % (10/17 0700) Weight:  [97.4 kg] 97.4 kg (10/17 0500) Last BM Date: 07/03/18 General:   Alert, chronically ill-appearing female, no acute distress.  Husband at bedside. Head:  Normocephalic and atraumatic. Eyes:  Sclera clear, no icterus.  Abdomen:  Soft, nontender and nondistended.  Normal bowel sounds, without guarding, and without rebound.   Extremities:  Without clubbing, deformity or edema. Neurologic:  Alert and  oriented x4;  grossly normal neurologically. Skin:  Intact without significant lesions or rashes. Psych:  Alert and cooperative. Normal mood and affect.  Intake/Output from previous day: 10/16 0701 - 10/17 0700 In: 190.9 [I.V.:52.2; IV Piggyback:138.8] Out: -  Intake/Output this shift: No intake/output data recorded.  Lab Results: CBC Recent Labs    07/02/18 1736 07/03/18 0515 07/04/18 0506  WBC 8.6 5.0 4.6  HGB 7.0* 7.1* 6.2*  HCT 26.4* 26.3* 21.5*  MCV 86.8 88.6 85.7  PLT 92* 56* 39*   BMET Recent Labs    07/02/18 1736 07/03/18 0515  NA 136 134*  K 3.4* 3.5  CL 106 104  CO2 21* 22  GLUCOSE 138* 189*  BUN 14 16  CREATININE 1.10* 1.18*  CALCIUM 8.0* 7.8*   LFTs Recent Labs    07/02/18 1736 07/03/18 0515  BILITOT 2.2* 2.6*  ALKPHOS 112 95  AST 35 31  ALT 37 31  PROT 6.5 5.7*  ALBUMIN 2.7* 2.4*   Recent Labs    07/02/18 1736  LIPASE 46   PT/INR No results for input(s): LABPROT, INR in the last 72 hours.    Imaging Studies: No results found.[2 weeks]   Assessment: 62 year old female with history of NAFLD cirrhosis, complicated by portal hypertension, esophageal varices with  GAVE, hepatic encephalopathy.  She has nonocclusive portal vein thrombosis that has extended to her SMV and concerning for affecting her transplant status.  Poor candidate for anticoagulation due to varices with GAVE.  Plans for TIPS due to nonocclusive thrombus in her portal vein, plans for concurrent embolization of retroperitoneal varices, scheduled for July 08, 2018.  Presented with significant drop in hemoglobin, symptomatic anemia with reported melena off and on for a couple of weeks.  First unit of packed red blood cells started on July 02, 2018 at 2138.  While transfusing, she spiked a temperature over 101.  Transfusion reaction work-up was negative.  Last night after midnight she developed another temperature 101.4. She was not receiving blood at that time. Her second unit of prbcs were started at 0535 this morning. Currently afebrile. Last Hgb was drawn WHILE PATIENT RECEIVING BLOOD.   Patient's platelets are down to 39,000.  Due to the complexity of patient's condition, significant thrombocytopenia, issues with blood transfusions and limited blood products at our facility, Dr. Gala Romney is recommended patient be transferred to her liver transplant center, [facility].  I have explained the situation with patient as well as spouse, they are in agreement.  I have also talked with Dr. Dyann Kief who will make contact with [facility].     Plan: 1. Discussed with nursing. Patient has orders for her third unit of prbcs with postransfusion H/H planned.  2. Transfer to  [facility] as above.   Laureen Ochs. [facility] [phone] 10/17/201910:00 AM     LOS: 0 days

## 2018-07-04 NOTE — Progress Notes (Signed)
Dr. Dyann Kief called and we discussed that patient was currently receiving blood. He told me to hold off on giving the 2 units of RBCs he ordered until we get a new H&H

## 2018-07-04 NOTE — Progress Notes (Signed)
CRITICAL VALUE ALERT  Critical Value:  Hemoglobin 6.7  Date & Time Notied:  07/04/2018 1515  Provider Notified:Dr Medera  Orders Received/Actions taken: awaiting orders

## 2018-07-04 NOTE — Progress Notes (Signed)
Patient has become even more drowsy; in addition to appearing to be confused.  Oral temp. noted to 101.4; patient medicated via pre-blood admin orders. Hospitalist paged via [place].

## 2018-07-04 NOTE — Progress Notes (Signed)
Provider returned call and the concerns of this RN regarding the patient's were shared. -vital signs will be collected every 2 hours -blood HELD for now

## 2018-07-04 NOTE — Progress Notes (Signed)
The provider to the bedside to assess the patient. -instructions as per Dr. Darrick Meigs hold transfusion until the patient's temp is less then or equal to 99.0

## 2018-07-04 NOTE — Progress Notes (Signed)
Patient aided to the [facility] and could assist with getting off the [facility] When asked to help with standing she only repeated ok. -pt. had to be assisted (2-person) off the [facility] Patient returned to bed and cooling blanket applied.

## 2018-07-04 NOTE — Progress Notes (Signed)
PROGRESS NOTE    Maria [name]  QKM:[id number] DOB: 1955/12/30 DOA: 07/02/2018 PCP: Glenda Chroman, MD    Brief Narrative:  Maria [name]  is a 62 y.o. female, w CAD, CHF, Dm2, Hypothyroidism, Anemia,  NASH cirrhosis, esophageal varices, GAVE, high-grade partial throbosis of portal vein,  apparently sent to ED by [facility] (where she is suppose to have TIPPS in the near future) due to Hgb 6.4 and black stool though she is on iron. + fatigue.  Pt denies fever, chills, cp, palp, sob, n/v, brbpr.   In Ed,  T 97.9, P 83, Bp 111/57  Pox 97%   Wbc 8.6, Hgb 7.0, Plt 92 Na 136, K3.4, Bun 14, Creatinine 1.10  Ast 35, Alt 37, Alk phos 112, T. Bili 2.2 BNP 20.0 Lipase 46  Pt will be admitted for symptomatic anemia and melena..    Assessment & Plan: 1-Symptomatic anemia: in the setting of acute on chronic GI bleed. -hx of cirrhosis and GAVE -continue PPI -GI recommended transfer to [facility] and will not attempt EGD -Hgb down to 6.2 -will plan for 2 units today -follow Hgb trend -Discussed with [facility] transplant team; patient accepted once bed available.  -continue supportive care.  2-benign transfusion reaction -will pre-medicate with tylenol -attempt 2 units of PRBC's today.  -follow Hgb trend in am  3-Cirrhosis, nonalcoholic ([place]) -continue torsemide and half dose inderal -continue Chronulac.  -BP soft.  4-Diabetes mellitus ([place]) -continue SSI  5-Iron deficiency anemia -due to chronic GIB -prior hx of not tolerating IV iron.  6-Thrombocytopenia ([place]) -due to cirrhosis -low sodium diet discussed -will resume spironolactone and torsemide once BP stable -follow trend .  7-hx of CAD (coronary artery disease) -continue statins  -continue metoprolol  8-Hypothyroidism -continue synthroid  9-anxiety -continue paxil and buspar  DVT prophylaxis: SCD's. Code Status: Full Family Communication: sister at bedside Disposition Plan: remains in the hospital in the setting of  symptomatic anemia; follow GI recommendations, EGD in am. Transfuse 2 more unit (she would received a total of 3 units after today's transfusion). Waiting bed at [facility] for transfer.  Consultants:   GI service.  Procedures:   Planned EGD in am  Antimicrobials:  Anti-infectives (From admission, onward)   None      Subjective: Fatigue, SOB with minimal exertion and just feeling tired. No CP, no abd pain. Positive [place] oxygen supplementation.  Objective: Vitals:   07/03/18 0050 07/03/18 0215 07/03/18 0525 07/03/18 1851  BP: (!) 131/48 (!) 120/48 (!) 108/34 (!) 82/34  Pulse: (!) 104 91 84 78  Resp: _0 Temp: (!) 101.8 F (38.8 C) (!) 100.4 F (38 C) 99.2 F (37.3 C) 100.1 F (37.8 C)  TempSrc: Oral Oral Oral Oral  SpO2: 94% 100% 95% 96%  Weight:      Height:        Intake/Output Summary (Last 24 hours) at 07/03/2018 2125 Last data filed at 07/03/2018 1506 Gross per 24 hour  Intake 406.75 ml  Output 100 ml  Net 306.75 ml   Filed Weights   07/02/18 2000  Weight: 96.7 kg    Examination: General exam: Alert, awake, oriented x 3; no CP, no nausea, no vomiting. Patient is pale on exam, with SOB on minimal exertion. Also requiring oxygen supplementation and reporting intermittent dark stools. Respiratory system: Clear to auscultation. Respiratory effort normal. Cardiovascular system:RRR. No murmurs, rubs, gallops. Gastrointestinal system: Abdomen is mildly distended, soft and nontender. Positive fluid wave. No organomegaly or masses felt. Normal  bowel sounds heard. Central nervous system: Alert and oriented. No focal neurological deficits. Extremities: No Cyanosis, no clubbing. Trace edema bilaterally appreciated. Skin: No rashes, lesions or ulcers Psychiatry: Judgement and insight appear normal. Mood & affect appropriate.     Data Reviewed: I have personally reviewed following labs and imaging studies  CBC: Recent Labs  Lab 07/02/18 1736 07/03/18 0515  WBC  8.6 5.0  NEUTROABS 6.8  --   HGB 7.0* 7.1*  HCT 26.4* 26.3*  MCV 86.8 88.6  PLT 92* 56*   Basic Metabolic Panel: Recent Labs  Lab 07/02/18 1736 07/03/18 0515  NA 136 134*  K 3.4* 3.5  CL 106 104  CO2 21* 22  GLUCOSE 138* 189*  BUN 14 16  CREATININE 1.10* 1.18*  CALCIUM 8.0* 7.8*   GFR: Estimated Creatinine Clearance: 52.9 mL/min (A) (by C-G formula based on SCr of 1.18 mg/dL (H)).   Liver Function Tests: Recent Labs  Lab 07/02/18 1736 07/03/18 0515  AST 35 31  ALT 37 31  ALKPHOS 112 95  BILITOT 2.2* 2.6*  PROT 6.5 5.7*  ALBUMIN 2.7* 2.4*   Recent Labs  Lab 07/02/18 1736  LIPASE 46   CBG: Recent Labs  Lab 07/02/18 2016 07/03/18 0808 07/03/18 1149 07/03/18 1657 07/03/18 2109  GLUCAP 206* 147* 170* 158* 179*   Anemia Panel: Recent Labs    07/02/18 1752  FERRITIN 3*  TIBC 437  IRON 33   Urine analysis:    Component Value Date/Time   COLORURINE YELLOW 07/03/2018 0340   APPEARANCEUR CLEAR 07/03/2018 0340   LABSPEC 1.011 07/03/2018 0340   PHURINE 5.0 07/03/2018 0340   GLUCOSEU NEGATIVE 07/03/2018 0340   HGBUR NEGATIVE 07/03/2018 0340   BILIRUBINUR NEGATIVE 07/03/2018 0340   KETONESUR NEGATIVE 07/03/2018 0340   PROTEINUR NEGATIVE 07/03/2018 0340   NITRITE NEGATIVE 07/03/2018 0340   LEUKOCYTESUR NEGATIVE 07/03/2018 0340     Radiology Studies: No results found.      Scheduled Meds: . sodium chloride   Intravenous Once  . acetaminophen  650 mg Oral Once  . busPIRone  5 mg Oral BID  . cholecalciferol  1,000 Units Oral Once per day on Mon Wed Fri  . feeding supplement (PRO-STAT SUGAR FREE 64)  30 mL Oral BID  . levothyroxine  25 mcg Oral QAC breakfast  . magnesium oxide  400 mg Oral Daily  . pantoprazole  40 mg Oral BID  . PARoxetine  40 mg Oral Daily  . potassium chloride  10 mEq Oral BID  . [START ON 07/04/2018] propranolol  10 mg Oral Daily  . rosuvastatin  5 mg Oral QODAY  . sodium chloride flush  3 mL Intravenous Q12H  .  torsemide  20 mg Oral Daily   Continuous Infusions: . sodium chloride    . sodium chloride    . famotidine (PEPCID) IV Stopped (07/03/18 0957)     LOS: 0 days    Time spent: 30 minutes.     Barton Dubois, MD [facility] Pager [phone]  If 7PM-7AM, please contact night-coverage [email or web address] Password [email or web address] 07/03/2018, 9:25 PM

## 2018-07-05 DIAGNOSIS — K31811 Angiodysplasia of stomach and duodenum with bleeding: Principal | ICD-10-CM

## 2018-07-05 LAB — GLUCOSE, CAPILLARY
Glucose-Capillary: 176 mg/dL — ABNORMAL HIGH (ref 70–99)
Glucose-Capillary: 274 mg/dL — ABNORMAL HIGH (ref 70–99)
Glucose-Capillary: 246 mg/dL — ABNORMAL HIGH (ref 70–99)
Glucose-Capillary: 227 mg/dL — ABNORMAL HIGH (ref 70–99)

## 2018-07-05 LAB — CBC
HCT: 26.3 % — ABNORMAL LOW (ref 36.0–46.0)
Hemoglobin: 7.8 g/dL — ABNORMAL LOW (ref 12.0–15.0)
MCH: 25.7 pg — ABNORMAL LOW (ref 26.0–34.0)
MCHC: 29.7 g/dL — ABNORMAL LOW (ref 30.0–36.0)
MCV: 86.5 fL (ref 80.0–100.0)
Platelets: 44 10*3/uL — ABNORMAL LOW (ref 150–400)
RBC: 3.04 MIL/uL — ABNORMAL LOW (ref 3.87–5.11)
RDW: 18.4 % — ABNORMAL HIGH (ref 11.5–15.5)
WBC: 3.5 10*3/uL — ABNORMAL LOW (ref 4.0–10.5)
nRBC: 0 % (ref 0.0–0.2)

## 2018-07-05 MED ORDER — INSULIN ASPART 100 UNIT/ML ~~LOC~~ SOLN
0.0000 [IU] | Freq: Every day | SUBCUTANEOUS | Status: DC
  Administered 2018-07-05 – 2018-07-06 (×2): 2 [IU] via SUBCUTANEOUS

## 2018-07-05 MED ORDER — INSULIN ASPART 100 UNIT/ML ~~LOC~~ SOLN
0.0000 [IU] | Freq: Three times a day (TID) | SUBCUTANEOUS | Status: DC
  Administered 2018-07-05 – 2018-07-06 (×2): 3 [IU] via SUBCUTANEOUS
  Administered 2018-07-06 (×2): 2 [IU] via SUBCUTANEOUS
  Administered 2018-07-07: 1 [IU] via SUBCUTANEOUS
  Administered 2018-07-07: 3 [IU] via SUBCUTANEOUS

## 2018-07-05 NOTE — Progress Notes (Signed)
Patient temp slightly elevated before blood transfusion. Patient has a h/o of febrile reaction to blood tranfusion.  Notified mid-level and received order for 500 cc bolus and 325 tylenol.  Orders received and carried out.  Will continue to monitor patient.

## 2018-07-05 NOTE — Progress Notes (Addendum)
Inpatient Diabetes Program Recommendations  AACE/ADA: New Consensus Statement on Inpatient Glycemic Control (2015)  Target Ranges:  Prepandial:   less than 140 mg/dL      Peak postprandial:   less than 180 mg/dL (1-2 hours)      Critically ill patients:  140 - 180 mg/dL   Lab Results  Component Value Date   GLUCAP 274 (H) 07/05/2018   HGBA1C 4.2 (L) 07/18/2017    Review of Glycemic Control Results for Maria [name], Maria [name] (MRN [id number]) as of 07/05/2018 12:40  Ref. Range 07/04/2018 16:47 07/04/2018 22:13 07/05/2018 07:52 07/05/2018 11:06  Glucose-Capillary Latest Ref Range: 70 - 99 mg/dL 226 (H) 247 (H) 176 (H) 274 (H)    Home DM Meds: Lantus 55 units Daily                             Humalog 3-12 units TID per SSI  Current Orders: None yet   Inpatient Diabetes Program Recommendations:    Consider adding Novolog 0-9 units TID and Novolog 0-5 units QHS.   If you note that AM CBGs are elevated, may also consider starting a small portion of pt's home dose of Lantus.  Thanks, Bronson Curb, MSN, RNC-OB Diabetes Coordinator [phone] (8a-5p)

## 2018-07-05 NOTE — Care Management Important Message (Signed)
Important Message  Patient Details  Name: Maria [name] MRN: [id number] Date of Birth: [birth year]   Medicare Important Message Given:  Yes    Sherald Barge, RN 07/05/2018, 11:55 AM

## 2018-07-05 NOTE — Progress Notes (Addendum)
PROGRESS NOTE    Maria [name]  HUD:[id number] DOB: [birth year] DOA: 07/02/2018 PCP: Glenda Chroman, MD    Brief Narrative:  Maria [name]  is a 62 y.o. female, w CAD, CHF, Dm2, Hypothyroidism, Anemia,  NASH cirrhosis, esophageal varices, GAVE, high-grade partial throbosis of portal vein,  apparently sent to ED by [facility] (where she is suppose to have TIPPS in the near future) due to Hgb 6.4 and black stool though she is on iron. + fatigue.  Pt denies fever, chills, cp, palp, sob, n/v, brbpr.   In Ed,  T 97.9, P 83, Bp 111/57  Pox 97%   Wbc 8.6, Hgb 7.0, Plt 92 Na 136, K3.4, Bun 14, Creatinine 1.10  Ast 35, Alt 37, Alk phos 112, T. Bili 2.2 BNP 20.0 Lipase 46  Pt will be admitted for symptomatic anemia and melena..    Assessment & Plan: 1-Symptomatic anemia: in the setting of acute on chronic GI bleed. -hx of cirrhosis and GAVE -continue PPI -GI recommended transfer to [facility] and will not attempt EGD here. -Hgb 7.8 after 3 units of PRBC's -follow Hgb trend -Discussed with [facility] transplant team; patient accepted once bed available.  -continue supportive care.  2-benign transfusion reaction -will pre-medicate with tylenol prior to transfusions. -has so far tolerated 3 units of PRBC's -Hgb 7.8 this morning.  -repeat CBC tomorrow   3-Cirrhosis, nonalcoholic ([facility]) -continue torsemide and half dose inderal -continue Chronulac.  -BP soft, but stable.  4-Diabetes mellitus ([place]) -continue SSI and adjust hypoglycemic regimen as needed.   5-Iron deficiency anemia -due to chronic GIB -prior hx of not tolerating IV iron infusions.  6-Thrombocytopenia ([place]) -due to cirrhosis -low sodium diet discussed  7-hx of CAD (coronary artery disease) -continue statins  -continue metoprolol  8-Hypothyroidism -continue synthroid  9-anxiety -continue paxil and buspar  10-pulmonary HTN -will continue torsemide as tolerated -BP soft currently -reports improvement in SOB after  transfusion; no crackles on exam.  11-class 2 obesity  -Body mass index is 40.77 kg/m. -low calorie diet discussed with patient   DVT prophylaxis: SCD's. Code Status: Full Family Communication: sister at bedside Disposition Plan: remains in the hospital in the setting of symptomatic anemia; following GI recommendations patient would be transfer to [facility] hospital (liver transplant unit). waiting for bed availability. Will advance diet, follow Hgb trend.   Consultants:   GI service.  Procedures:   None   Antimicrobials:  Anti-infectives (From admission, onward)   None      Subjective: Feeling stronger. No CP, no nausea, no vomiting. Patient is off oxygen today. No acute complaints. Tolerated transfusions yesterday (10/17).   Objective: Vitals:   07/03/18 0050 07/03/18 0215 07/03/18 0525 07/03/18 1851  BP: (!) 131/48 (!) 120/48 (!) 108/34 (!) 82/34  Pulse: (!) 104 91 84 78  Resp: 18 20 20    Temp: (!) 101.8 F (38.8 C) (!) 100.4 F (38 C) 99.2 F (37.3 C) 100.1 F (37.8 C)  TempSrc: Oral Oral Oral Oral  SpO2: 94% 100% 95% 96%  Weight:      Height:       Intake/Output Summary (Last 24 hours) at 07/03/2018 2125 Last data filed at 07/03/2018 1506 Gross per 24 hour  Intake 406.75 ml  Output 100 ml  Net 306.75 ml   Filed Weights   07/02/18 2000  Weight: 96.7 kg   Examination: General exam: Alert, awake, oriented x 3; less pale on exam. Feeling slightly stronger today. Denies CP and is off oxygen. Dark brown loose  stools reported. No nausea, no vomiting.  Respiratory system: Clear to auscultation. Respiratory effort normal. Cardiovascular system:RRR. No murmurs, rubs, gallops. Gastrointestinal system: Abdomen is mildly distended, soft and nontender. No organomegaly or masses felt. Normal bowel sounds heard. Positive fluid wave appreciated. Central nervous system: Alert and oriented. No focal neurological deficits. Extremities: trace to 1+ edema bilaterally, no  cyanosis, no clubbing  Skin: No rashes, no petechiae. RUE with big bruise in her  Inner aspect. Psychiatry: Judgement and insight appear normal. Mood & affect appropriate.   Data Reviewed: I have personally reviewed following labs and imaging studies  CBC: Recent Labs  Lab 07/02/18 1736 07/03/18 0515  WBC 8.6 5.0  NEUTROABS 6.8  --   HGB 7.0* 7.1*  HCT 26.4* 26.3*  MCV 86.8 88.6  PLT 92* 56*   Basic Metabolic Panel: Recent Labs  Lab 07/02/18 1736 07/03/18 0515  NA 136 134*  K 3.4* 3.5  CL 106 104  CO2 21* 22  GLUCOSE 138* 189*  BUN 14 16  CREATININE 1.10* 1.18*  CALCIUM 8.0* 7.8*   GFR: Estimated Creatinine Clearance: 52.9 mL/min (A) (by C-G formula based on SCr of 1.18 mg/dL (H)).   Liver Function Tests: Recent Labs  Lab 07/02/18 1736 07/03/18 0515  AST 35 31  ALT 37 31  ALKPHOS 112 95  BILITOT 2.2* 2.6*  PROT 6.5 5.7*  ALBUMIN 2.7* 2.4*   Recent Labs  Lab 07/02/18 1736  LIPASE 46   CBG: Recent Labs  Lab 07/02/18 2016 07/03/18 0808 07/03/18 1149 07/03/18 1657 07/03/18 2109  GLUCAP 206* 147* 170* 158* 179*   Anemia Panel: Recent Labs    07/02/18 1752  FERRITIN 3*  TIBC 437  IRON 33   Urine analysis:    Component Value Date/Time   COLORURINE YELLOW 07/03/2018 0340   APPEARANCEUR CLEAR 07/03/2018 0340   LABSPEC 1.011 07/03/2018 0340   PHURINE 5.0 07/03/2018 0340   GLUCOSEU NEGATIVE 07/03/2018 0340   HGBUR NEGATIVE 07/03/2018 0340   BILIRUBINUR NEGATIVE 07/03/2018 0340   KETONESUR NEGATIVE 07/03/2018 0340   PROTEINUR NEGATIVE 07/03/2018 0340   NITRITE NEGATIVE 07/03/2018 0340   LEUKOCYTESUR NEGATIVE 07/03/2018 0340   Scheduled Meds: . sodium chloride   Intravenous Once  . acetaminophen  650 mg Oral Once  . busPIRone  5 mg Oral BID  . cholecalciferol  1,000 Units Oral Once per day on Mon Wed Fri  . feeding supplement (PRO-STAT SUGAR FREE 64)  30 mL Oral BID  . levothyroxine  25 mcg Oral QAC breakfast  . magnesium oxide  400 mg  Oral Daily  . pantoprazole  40 mg Oral BID  . PARoxetine  40 mg Oral Daily  . potassium chloride  10 mEq Oral BID  . [START ON 07/04/2018] propranolol  10 mg Oral Daily  . rosuvastatin  5 mg Oral QODAY  . sodium chloride flush  3 mL Intravenous Q12H  . torsemide  20 mg Oral Daily   Continuous Infusions: . sodium chloride    . sodium chloride    . famotidine (PEPCID) IV Stopped (07/03/18 0957)     LOS: 0 days    Time spent: 30 minutes.   Barton Dubois, MD [facility] Pager [phone]  If 7PM-7AM, please contact night-coverage [email or web address] Password [email or web address] 07/03/2018, 9:25 PM

## 2018-07-05 NOTE — Progress Notes (Signed)
Subjective: Feels somewhat better today. Fatigue slightly improved after PRBC. Occasional RUQ pain, worse after she eats; thinks it's due to varices. No other GI complaints  Objective: Vital signs in last 24 hours: Temp:  [97.4 F (36.3 C)-99.4 F (37.4 C)] 97.4 F (36.3 C) (10/18 0642) Pulse Rate:  [64-78] 64 (10/18 0642) Resp:  [16-20] 20 (10/18 0642) BP: (90-122)/(29-42) 112/42 (10/18 0642) SpO2:  [96 %-98 %] 98 % (10/18 0642) Weight:  [101.1 kg] 101.1 kg (10/18 0642) Last BM Date: 07/02/18 General:   Alert and oriented, pleasant Head:  Normocephalic and atraumatic. Eyes:  No icterus, sclera clear. Conjuctiva pink.  Neck:  Supple, without thyromegaly or masses.  Heart:  S1, S2 present, no murmurs noted.  Lungs: Clear to auscultation bilaterally, without wheezing, rales, or rhonchi.  Abdomen:  Bowel sounds present, soft, non-tender, non-distended. No HSM or hernias noted. No rebound or guarding. No masses appreciated  Msk:  Symmetrical without gross deformities. Pulses:  Normal bilateral DP pulses noted. Extremities:  Without clubbing or edema. Neurologic:  Alert and  oriented x4;  grossly normal neurologically. Cervical Nodes:  No significant cervical adenopathy. Psych:  Alert and cooperative. Normal mood and affect.  Intake/Output from previous day: 10/17 0701 - 10/18 0700 In: 2667.1 [P.O.:480; I.V.:1472.1; Blood:665; IV Piggyback:50] Out: -  Intake/Output this shift: No intake/output data recorded.  Lab Results: Recent Labs    07/03/18 0515 07/04/18 0506 07/04/18 1419 07/05/18 0547  WBC 5.0 4.6  --  3.5*  HGB 7.1* 6.2* 6.7* 7.8*  HCT 26.3* 21.5* 23.7* 26.3*  PLT 56* 39*  --  44*   BMET Recent Labs    07/02/18 1736 07/03/18 0515  NA 136 134*  K 3.4* 3.5  CL 106 104  CO2 21* 22  GLUCOSE 138* 189*  BUN 14 16  CREATININE 1.10* 1.18*  CALCIUM 8.0* 7.8*   LFT Recent Labs    07/02/18 1736 07/03/18 0515  PROT 6.5 5.7*  ALBUMIN 2.7* 2.4*  AST 35  31  ALT 37 31  ALKPHOS 112 95  BILITOT 2.2* 2.6*   PT/INR No results for input(s): LABPROT, INR in the last 72 hours. Hepatitis Panel No results for input(s): HEPBSAG, HCVAB, HEPAIGM, HEPBIGM in the last 72 hours.   Studies/Results: No results found.  Assessment: 62 year old female with history of NAFLD cirrhosis, complicated by portal hypertension, esophageal varices with GAVE, hepatic encephalopathy.  She has nonocclusive portal vein thrombosis that has extended to her SMV and concerning for affecting her transplant status.  Poor candidate for anticoagulation due to varices with GAVE.  Plans for TIPS due to nonocclusive thrombus in her portal vein, plans for concurrent embolization of retroperitoneal varices, scheduled for July 08, 2018.  Presented with significant drop in hemoglobin, symptomatic anemia with reported melena off and on for a couple of weeks.  First unit of packed red blood cells started on July 02, 2018 at 2138.  While transfusing, she spiked a temperature over 101. Transfusion reaction work-up was negative.  She developed another temperature 101.4 a day later and was not receiving blood at that time. Her second unit of prbcs were started at 0535 yesterday morning. Per "post-PRBC" Hgb was drawn WHILE PATIENT RECEIVING BLOOD.   Patient's platelets declined to 39,000 yesterday (92,000 3 days prior); increased to 44,000 today.  Due to the complexity of patient's condition, significant thrombocytopenia, issues with blood transfusions and limited blood products at our facility, it was recommended patient be transferred to her liver transplant center,  [place]. Dr. Lawson Fiscal spoke with [facility] who agreed to admit the patient to step-down bed; no awaiting bed to become available.  Today she is symptomatically improved after transfusions. Hgb 7.8 today (6.2 to 6.7 yesterday) after 3 units PRBC this admission. No significant GI complaints today. She is updated and aware of  "waiting for open bed" status at [facility].  Plan: 1. Monitor closely for further GI bleed 2. Follow hgb closely 3. Transfuse as necessary 4. Transfer to [facility] when bed becomes available 5. Supportive measures   Thank you for allowing [place] to participate in the care of Maria Mercury, DNP, AGNP-C Adult & Gerontological Nurse Practitioner [facility]     LOS: 1 day    07/05/2018, 8:50 AM

## 2018-07-05 NOTE — Progress Notes (Signed)
Patient received one unit of blood and tolerated well.

## 2018-07-05 NOTE — Progress Notes (Signed)
Stand up scale no readily available to weight patient this am.

## 2018-07-06 LAB — TYPE AND SCREEN
ABO/RH(D): O NEG
Antibody Screen: NEGATIVE
Unit division: 0
Unit division: 0
Unit division: 0
Unit division: 0

## 2018-07-06 LAB — CBC WITH DIFFERENTIAL/PLATELET
Abs Immature Granulocytes: 0.01 10*3/uL (ref 0.00–0.07)
Basophils Absolute: 0 10*3/uL (ref 0.0–0.1)
Basophils Relative: 1 %
Eosinophils Absolute: 0.1 10*3/uL (ref 0.0–0.5)
Eosinophils Relative: 3 %
HCT: 27.6 % — ABNORMAL LOW (ref 36.0–46.0)
Hemoglobin: 8.1 g/dL — ABNORMAL LOW (ref 12.0–15.0)
Immature Granulocytes: 0 %
Lymphocytes Relative: 24 %
Lymphs Abs: 0.8 10*3/uL (ref 0.7–4.0)
MCH: 25.1 pg — ABNORMAL LOW (ref 26.0–34.0)
MCHC: 29.3 g/dL — ABNORMAL LOW (ref 30.0–36.0)
MCV: 85.4 fL (ref 80.0–100.0)
Monocytes Absolute: 0.5 10*3/uL (ref 0.1–1.0)
Monocytes Relative: 14 %
Neutro Abs: 2 10*3/uL (ref 1.7–7.7)
Neutrophils Relative %: 58 %
Platelets: 38 10*3/uL — ABNORMAL LOW (ref 150–400)
RBC: 3.23 MIL/uL — ABNORMAL LOW (ref 3.87–5.11)
RDW: 18.6 % — ABNORMAL HIGH (ref 11.5–15.5)
WBC: 3.5 10*3/uL — ABNORMAL LOW (ref 4.0–10.5)
nRBC: 0 % (ref 0.0–0.2)

## 2018-07-06 LAB — BPAM RBC
Blood Product Expiration Date: 201911172359
Blood Product Expiration Date: 201911192359
Blood Product Expiration Date: 201911212359
Blood Product Expiration Date: 201911252359
ISSUE DATE / TIME: 201910152131
ISSUE DATE / TIME: 201910170530
ISSUE DATE / TIME: 201910172248
Unit Type and Rh: 9500
Unit Type and Rh: 9500
Unit Type and Rh: 9500
Unit Type and Rh: 9500

## 2018-07-06 LAB — COMPREHENSIVE METABOLIC PANEL
ALT: 28 U/L (ref 0–44)
AST: 31 U/L (ref 15–41)
Albumin: 2.1 g/dL — ABNORMAL LOW (ref 3.5–5.0)
Alkaline Phosphatase: 82 U/L (ref 38–126)
Anion gap: 6 (ref 5–15)
BUN: 16 mg/dL (ref 8–23)
CO2: 23 mmol/L (ref 22–32)
Calcium: 7.5 mg/dL — ABNORMAL LOW (ref 8.9–10.3)
Chloride: 108 mmol/L (ref 98–111)
Creatinine, Ser: 1.05 mg/dL — ABNORMAL HIGH (ref 0.44–1.00)
GFR calc Af Amer: >60 mL/min (ref >60)
GFR calc non Af Amer: 55 mL/min — ABNORMAL LOW (ref >60)
Glucose, Bld: 179 mg/dL — ABNORMAL HIGH (ref 70–99)
Potassium: 3.6 mmol/L (ref 3.5–5.1)
Sodium: 137 mmol/L (ref 135–145)
Total Bilirubin: 2.2 mg/dL — ABNORMAL HIGH (ref 0.3–1.2)
Total Protein: 5 g/dL — ABNORMAL LOW (ref 6.5–8.1)

## 2018-07-06 LAB — PROTIME-INR
INR: 1.69
Prothrombin Time: 19.7 seconds — ABNORMAL HIGH (ref 11.4–15.2)

## 2018-07-06 LAB — GLUCOSE, CAPILLARY
Glucose-Capillary: 159 mg/dL — ABNORMAL HIGH (ref 70–99)
Glucose-Capillary: 167 mg/dL — ABNORMAL HIGH (ref 70–99)
Glucose-Capillary: 219 mg/dL — ABNORMAL HIGH (ref 70–99)
Glucose-Capillary: 203 mg/dL — ABNORMAL HIGH (ref 70–99)

## 2018-07-06 MED ORDER — FAMOTIDINE 20 MG PO TABS
40.0000 mg | ORAL_TABLET | Freq: Every day | ORAL | Status: DC
  Administered 2018-07-06: 40 mg via ORAL
  Filled 2018-07-06: qty 2

## 2018-07-06 NOTE — Progress Notes (Addendum)
Patient ID: Maria [name], female   DOB: [birth year], 62 y.o.   MRN: [id number]   Assessment/Plan: Admitted with transfusion dependent anemia/intermittent melena(Hb DOWN TO 6.2).   1. 11:30 spoke with [facility] TRANSFER LINE. NO BED AVAILABLE AT THIS TIME. AGREE PT COULD TRANSFER TO A FLOOR BED. IF NOT TRANSFERRED IN 244 HRS AND PT IS STABLE, CONSIDER D/C HOME AND PT CAN KEEP CURRENT OUTPT APPT AT [facility] FOR TIPS ON OCT 21. 2. SUPPORTIVE CARE. 3. TRANSFUSE IF NEEDED TO KEEP Hb > 8.0.  GREATER THAN 50% WAS SPENT IN COUNSELING & COORDINATION OF CARE WITH THE PATIENT: DISCUSSED DIFFERENTIAL DIAGNOSIS, PLAN AND TRANSFUSION DEPENDENT ANEMIA/MELENA. TOTAL ENCOUNTER TIME: 25 MINS.    Subjective: Since I last evaluated the patient SHE IS TOLERATING POs. MILD NAUSEA/NO VOMITING. SCHEDULED FOR TIPS/ADMISSION AT [facility]. SMALL BROWN BMs. HAD 3 LAST NIGHT. HAS LARGE BRUISE ON R ARM.  Objective: Vital signs in last 24 hours: Vitals:   07/06/18 0531 07/06/18 0932  BP: (!) 102/51   Pulse: 71   Resp: 17   Temp: 98.6 F (37 C)   SpO2: 100% 92%   General appearance: alert, cooperative and no distress Resp: clear to auscultation bilaterally Cardio: regular rate and rhythm GI: soft, non-tender, MILD DISTENSTION; bowel sounds normal;  NEURO: NO  NEW FOCAL DEFICITS Extremities: edema TRACE BILATERAL LOWER EXTERMITIES  Lab Results: Cr 1.05 T BILI 2.2 Hb 8.1 PLT CT 38  Studies/Results: No results found.  Medications: I have reviewed the patient's current medications.

## 2018-07-06 NOTE — Progress Notes (Signed)
PROGRESS NOTE    Maria [name]  VOZ:[id number] DOB: 12/24/1955 DOA: 07/02/2018 PCP: Glenda Chroman, MD    Brief Narrative:  Maria [name]  is a 62 y.o. female, w CAD, CHF, Dm2, Hypothyroidism, Anemia,  NASH cirrhosis, esophageal varices, GAVE, high-grade partial throbosis of portal vein,  apparently sent to ED by [facility] (where she is suppose to have TIPPS in the near future) due to Hgb 6.4 and black stool though she is on iron. + fatigue.  Pt denies fever, chills, cp, palp, sob, n/v, brbpr.   In Ed,  T 97.9, P 83, Bp 111/57  Pox 97%   Wbc 8.6, Hgb 7.0, Plt 92 Na 136, K3.4, Bun 14, Creatinine 1.10  Ast 35, Alt 37, Alk phos 112, T. Bili 2.2 BNP 20.0 Lipase 46  Pt will be admitted for symptomatic anemia and melena..    Assessment & Plan: 1-Symptomatic anemia: in the setting of acute on chronic GI bleed. -hx of cirrhosis and GAVE -continue PPI -GI recommended transfer to [facility] and will not attempt EGD here. -Hgb 8.1 after 3 units of PRBC's -follow Hgb trend -Discussed with [facility] transplant team; patient accepted once bed available.  -continue supportive care.  2-benign transfusion reaction -will pre-medicate with tylenol prior to transfusions. -has so far tolerated 3 units of PRBC's -Hgb 8.1 this morning.  -repeat CBC tomorrow in a.m.  3-Cirrhosis, nonalcoholic ([place]) -continue torsemide and half dose inderal -continue Chronulac.  -BP soft, but stable.  4-Diabetes mellitus ([place]) -continue SSI and adjust hypoglycemic regimen as needed.   5-Iron deficiency anemia -due to chronic GIB -prior hx of not tolerating IV iron infusions.  6-Thrombocytopenia ([place]) -due to cirrhosis -low sodium diet discussed  7-hx of CAD (coronary artery disease) -continue statins  -continue metoprolol -No abnormalities appreciated on telemetry in the last 72 hours.  Telemetry will be discontinued.  8-Hypothyroidism -continue synthroid  9-anxiety -continue paxil and  buspar  10-pulmonary HTN -will continue torsemide as tolerated -BP soft currently -reports improvement in SOB after transfusion; no crackles on exam.  11-class 2 obesity  -Body mass index is 41.05 kg/m. -low calorie diet discussed with patient   DVT prophylaxis: SCD's. Code Status: Full Family Communication: sister at bedside Disposition Plan: remains in the hospital in the setting of symptomatic anemia; following GI recommendations patient would be transfer to [facility] hospital (liver transplant unit). waiting for bed availability. Will advance diet, follow Hgb trend.   Consultants:   GI service.  Procedures:   None   Antimicrobials:  Anti-infectives (From admission, onward)   None      Subjective: No chest pain, no nausea, no vomiting.  Patient is off oxygen supplementation and feeling stronger.  Denies palpitations.  Objective: Vitals:   07/03/18 0050 07/03/18 0215 07/03/18 0525 07/03/18 1851  BP: (!) 131/48 (!) 120/48 (!) 108/34 (!) 82/34  Pulse: (!) 104 91 84 78  Resp: 18 20 20    Temp: (!) 101.8 F (38.8 C) (!) 100.4 F (38 C) 99.2 F (37.3 C) 100.1 F (37.8 C)  TempSrc: Oral Oral Oral Oral  SpO2: 94% 100% 95% 96%  Weight:      Height:       Intake/Output Summary (Last 24 hours) at 07/03/2018 2125 Last data filed at 07/03/2018 1506 Gross per 24 hour  Intake 406.75 ml  Output 100 ml  Net 306.75 ml   Filed Weights   07/02/18 2000  Weight: 96.7 kg   Examination: General exam: Alert, awake, oriented x 3; feeling a slightly  stronger and denying any chest pain, nausea, vomiting or significant shortness of breath.  Patient reports no seen any further black stools.  Respiratory system: Clear to auscultation. Respiratory effort normal. Cardiovascular system:RRR. No murmurs, rubs, gallops. Gastrointestinal system: Abdomen is mildly distended, soft and nontender. No organomegaly or masses felt. Normal bowel sounds heard.  Positive fluid wave from ascites  appreciated.  No guarding. Central nervous system: Alert and oriented. No focal neurological deficits. Extremities: No cyanosis or clubbing.  1+ edema bilaterally appreciated on exam. Skin: No rashes, no petechiae, no open ulcers.  Patient with bruise on her right upper extremity after infiltrated IV line. Psychiatry: Judgement and insight appear normal. Mood & affect appropriate.   Data Reviewed: I have personally reviewed following labs and imaging studies  CBC: Recent Labs  Lab 07/02/18 1736 07/03/18 0515  WBC 8.6 5.0  NEUTROABS 6.8  --   HGB 7.0* 7.1*  HCT 26.4* 26.3*  MCV 86.8 88.6  PLT 92* 56*   Basic Metabolic Panel: Recent Labs  Lab 07/02/18 1736 07/03/18 0515  NA 136 134*  K 3.4* 3.5  CL 106 104  CO2 21* 22  GLUCOSE 138* 189*  BUN 14 16  CREATININE 1.10* 1.18*  CALCIUM 8.0* 7.8*   GFR: Estimated Creatinine Clearance: 52.9 mL/min (A) (by C-G formula based on SCr of 1.18 mg/dL (H)).   Liver Function Tests: Recent Labs  Lab 07/02/18 1736 07/03/18 0515  AST 35 31  ALT 37 31  ALKPHOS 112 95  BILITOT 2.2* 2.6*  PROT 6.5 5.7*  ALBUMIN 2.7* 2.4*   Recent Labs  Lab 07/02/18 1736  LIPASE 46   CBG: Recent Labs  Lab 07/02/18 2016 07/03/18 0808 07/03/18 1149 07/03/18 1657 07/03/18 2109  GLUCAP 206* 147* 170* 158* 179*   Anemia Panel: Recent Labs    07/02/18 1752  FERRITIN 3*  TIBC 437  IRON 33   Urine analysis:    Component Value Date/Time   COLORURINE YELLOW 07/03/2018 0340   APPEARANCEUR CLEAR 07/03/2018 0340   LABSPEC 1.011 07/03/2018 0340   PHURINE 5.0 07/03/2018 0340   GLUCOSEU NEGATIVE 07/03/2018 0340   HGBUR NEGATIVE 07/03/2018 0340   BILIRUBINUR NEGATIVE 07/03/2018 0340   KETONESUR NEGATIVE 07/03/2018 0340   PROTEINUR NEGATIVE 07/03/2018 0340   NITRITE NEGATIVE 07/03/2018 0340   LEUKOCYTESUR NEGATIVE 07/03/2018 0340   Scheduled Meds: . sodium chloride   Intravenous Once  . acetaminophen  650 mg Oral Once  . busPIRone  5 mg  Oral BID  . cholecalciferol  1,000 Units Oral Once per day on Mon Wed Fri  . feeding supplement (PRO-STAT SUGAR FREE 64)  30 mL Oral BID  . levothyroxine  25 mcg Oral QAC breakfast  . magnesium oxide  400 mg Oral Daily  . pantoprazole  40 mg Oral BID  . PARoxetine  40 mg Oral Daily  . potassium chloride  10 mEq Oral BID  . [START ON 07/04/2018] propranolol  10 mg Oral Daily  . rosuvastatin  5 mg Oral QODAY  . sodium chloride flush  3 mL Intravenous Q12H  . torsemide  20 mg Oral Daily   Continuous Infusions: . sodium chloride    . sodium chloride    . famotidine (PEPCID) IV Stopped (07/03/18 0957)     LOS: 0 days    Time spent: 30 minutes.   Barton Dubois, MD [facility] Pager [phone]  If 7PM-7AM, please contact night-coverage [email or web address] Password [email or web address] 07/03/2018, 9:25 PM

## 2018-07-07 ENCOUNTER — Encounter
Admit: 2018-07-07 | Discharge: 2018-07-11 | Disposition: A | Discharge status: Home Health | Payer: PRIVATE HEALTH INSURANCE | Source: Other Acute Inpatient Hospital | Attending: Anesthesiology | Admitting: Internal Medicine

## 2018-07-07 ENCOUNTER — Ambulatory Visit
Admit: 2018-07-07 | Discharge: 2018-07-11 | Disposition: A | Discharge status: Home Health | Payer: PRIVATE HEALTH INSURANCE | Source: Other Acute Inpatient Hospital | Admitting: Internal Medicine

## 2018-07-07 DIAGNOSIS — K7290 Hepatic failure, unspecified without coma: Secondary | ICD-10-CM | POA: Diagnosis not present

## 2018-07-07 DIAGNOSIS — K3189 Other diseases of stomach and duodenum: Secondary | ICD-10-CM | POA: Diagnosis not present

## 2018-07-07 DIAGNOSIS — I81 Portal vein thrombosis: Secondary | ICD-10-CM | POA: Diagnosis not present

## 2018-07-07 DIAGNOSIS — M810 Age-related osteoporosis without current pathological fracture: Secondary | ICD-10-CM | POA: Diagnosis not present

## 2018-07-07 DIAGNOSIS — K31819 Angiodysplasia of stomach and duodenum without bleeding: Secondary | ICD-10-CM

## 2018-07-07 DIAGNOSIS — E7800 Pure hypercholesterolemia, unspecified: Secondary | ICD-10-CM | POA: Diagnosis not present

## 2018-07-07 DIAGNOSIS — Z9049 Acquired absence of other specified parts of digestive tract: Secondary | ICD-10-CM | POA: Diagnosis not present

## 2018-07-07 DIAGNOSIS — R188 Other ascites: Secondary | ICD-10-CM | POA: Diagnosis not present

## 2018-07-07 DIAGNOSIS — K7460 Unspecified cirrhosis of liver: Secondary | ICD-10-CM | POA: Diagnosis not present

## 2018-07-07 DIAGNOSIS — E039 Hypothyroidism, unspecified: Secondary | ICD-10-CM | POA: Diagnosis not present

## 2018-07-07 DIAGNOSIS — K31811 Angiodysplasia of stomach and duodenum with bleeding: Secondary | ICD-10-CM | POA: Diagnosis not present

## 2018-07-07 DIAGNOSIS — F329 Major depressive disorder, single episode, unspecified: Secondary | ICD-10-CM | POA: Diagnosis not present

## 2018-07-07 DIAGNOSIS — F411 Generalized anxiety disorder: Secondary | ICD-10-CM | POA: Diagnosis not present

## 2018-07-07 DIAGNOSIS — K766 Portal hypertension: Secondary | ICD-10-CM | POA: Diagnosis not present

## 2018-07-07 DIAGNOSIS — G9340 Encephalopathy, unspecified: Secondary | ICD-10-CM | POA: Diagnosis not present

## 2018-07-07 DIAGNOSIS — E66813 Obesity, class 3: Secondary | ICD-10-CM

## 2018-07-07 DIAGNOSIS — D500 Iron deficiency anemia secondary to blood loss (chronic): Secondary | ICD-10-CM | POA: Diagnosis not present

## 2018-07-07 DIAGNOSIS — Z794 Long term (current) use of insulin: Secondary | ICD-10-CM | POA: Diagnosis not present

## 2018-07-07 DIAGNOSIS — K760 Fatty (change of) liver, not elsewhere classified: Secondary | ICD-10-CM | POA: Diagnosis not present

## 2018-07-07 DIAGNOSIS — M797 Fibromyalgia: Secondary | ICD-10-CM | POA: Diagnosis not present

## 2018-07-07 DIAGNOSIS — E119 Type 2 diabetes mellitus without complications: Secondary | ICD-10-CM | POA: Diagnosis not present

## 2018-07-07 DIAGNOSIS — K219 Gastro-esophageal reflux disease without esophagitis: Secondary | ICD-10-CM | POA: Diagnosis not present

## 2018-07-07 LAB — CBC
HCT: 27.5 % — ABNORMAL LOW (ref 36.0–46.0)
Hemoglobin: 7.9 g/dL — ABNORMAL LOW (ref 12.0–15.0)
MCH: 24.6 pg — ABNORMAL LOW (ref 26.0–34.0)
MCHC: 28.7 g/dL — ABNORMAL LOW (ref 30.0–36.0)
MCV: 85.7 fL (ref 80.0–100.0)
Platelets: 38 10*3/uL — ABNORMAL LOW (ref 150–400)
RBC: 3.21 MIL/uL — ABNORMAL LOW (ref 3.87–5.11)
RDW: 18.7 % — ABNORMAL HIGH (ref 11.5–15.5)
WBC: 3.1 10*3/uL — ABNORMAL LOW (ref 4.0–10.5)
nRBC: 0 % (ref 0.0–0.2)

## 2018-07-07 LAB — GLUCOSE, CAPILLARY
Glucose-Capillary: 150 mg/dL — ABNORMAL HIGH (ref 70–99)
Glucose-Capillary: 226 mg/dL — ABNORMAL HIGH (ref 70–99)

## 2018-07-07 MED ORDER — PROPRANOLOL HCL 10 MG PO TABS: 10.0000 mg | ORAL_TABLET | Freq: Every day | ORAL | Status: DC

## 2018-07-07 MED ORDER — PANTOPRAZOLE SODIUM 40 MG PO TBEC: 40.0000 mg | DELAYED_RELEASE_TABLET | Freq: Two times a day (BID) | ORAL | Status: DC

## 2018-07-07 MED ORDER — PRO-STAT SUGAR FREE PO LIQD: 30.0000 mL | Freq: Two times a day (BID) | ORAL | 0 refills | Status: DC

## 2018-07-07 MED ORDER — SPIRONOLACTONE 50 MG PO TABS: 25.0000 mg | ORAL_TABLET | Freq: Two times a day (BID) | ORAL | Status: DC

## 2018-07-07 MED ORDER — FAMOTIDINE 20 MG PO TABS: 20.0000 mg | ORAL_TABLET | Freq: Every day | ORAL | 1 refills | Status: DC

## 2018-07-07 NOTE — Progress Notes (Addendum)
Report called to Jeris Penta, nurse at [facility] for room 8321-1.  Later room switched to MPCU-1 and gave report to [name] .   Patient aware of room change.

## 2018-07-07 NOTE — Progress Notes (Signed)
Patient ID: Maria [name], female   DOB: [birth year], 62 y.o.   MRN: [id number]   Assessment/Plan: ADMITTED OCT 15 WITH GI BLEED/MELENA AND KNOWN HISTORY OF RETROPERITONEAL VARICES/PORTAL HYPERTENSIVE GASTROPATHY[MELD SCORE 16, CHILD PUGH B(9)]. NO MELENA.Hb STABLE.  PLAN:  1. 0955-SPOKE TO DR. ALEX SKIDMORE. EXPLAINED PT ADMITTED FOR GI BLEED BUT STABLE. CONSIDERING TRANSFER VIA POV. 1009: NO BEDS AVAILABLE AT [facility]. 1100: SPOKE [place]. HE FELT PT COULD NOT BE TRANSPORTED VIA POV, AGREED O ARRANGE FOR AMBULANCE TRANSFER FOR TIPS IN AM. HE IS AWARE THAT SOME PTS HAVE WORSENING ENCEPHALOPATHY AFTER TIPS AND THERE IS NO WAY TO PREDICT WHICH PTS WILL. THE BENEFITS(REDUCED GI BLEED) OF TIPS AT THIS POINT OUTWEIGH RISKS(WORSENING ENCEPHALOPATHY). DISCUSSED REGULATIONS WITH [employer]GAN DONATION AND OUR INABILITY TO MAKE A BED COME OPEN FOR HER TRANSFER. 1130: PTS HAS BED AT [facility](3154). CALLED [facility] CONSULT LINE TO DISCUSS WITH DR. Joseph Berkshire AND AM AWAITING A RETURN CALL. 135: SPOKE WITH DR. Joseph Berkshire. HE IS AWARE PT BEING TRANSFERRED TODAY. 2. D/C PEPCID QHS. CONTINUE PROTONIX. 3. CONTINUE INDERAL.  GREATER THAN 50% WAS SPENT IN COUNSELING & COORDINATION OF CARE WITH THE PATIENT: DISCUSSED PROCEDURE, BENEFITS, RISKS, AND MANAGEMENT OF CHRONIC GI BLEED IN CIRRHOTIC PT WITH PARTIALLY COMPENSATED DISEASE. TOTAL ENCOUNTER TIME: 35 MINS.    Subjective: Since I last evaluated the patient HER HUSBAND SAID SHE HAS BEEN OCCASIONALLY CONFUSED. TOLERATING POs.  Objective: Vital signs in last 24 hours: Vitals:   07/06/18 2138 07/07/18 0651  BP: (!) 112/54 (!) 103/44  Pulse: 71 69  Resp: 20 19  Temp: 98.7 F (37.1 C) 98.3 F (36.8 C)  SpO2: 95% 94%   General appearance: alert and no distress Resp: clear to auscultation bilaterally Cardio: regular rate and rhythm GI: soft, non-tender; bowel sounds normal;  Extremities: extremities normal, atraumatic, TRACE PEDAL edema  Lab  Results:    OCT 15 -->1upRBCsOCT 17-->2upRBCs OCT 18 OCT 19 OCT 20 Hb        7.1      6.2     7.8  8.1  7.9   PLT CT     92      39     44  38  38  Studies/Results: No results found.  Medications: I have reviewed the patient's current medications.

## 2018-07-07 NOTE — Discharge Summary (Signed)
Physician Discharge Summary  Maria [name] [id number]:[id number] DOB: 11/18/1955 DOA: 07/02/2018  PCP: Glenda Chroman, MD  Admit date: 07/02/2018 Discharge date: 07/07/2018  Time spent: 35 minutes  Recommendations for Outpatient Follow-up:  1. Repeat CBC to follow hemoglobin and platelets count. 2. Follow volume status and further adjust diuretics regimen. 3. Further recommendations and adjustment in current maintenance therapy to be dictated by liver transplant service.   Discharge Diagnoses:  Principal Problem:   Symptomatic anemia Active Problems:   Cirrhosis, nonalcoholic (HCC)   Diabetes mellitus (HCC)   Iron deficiency anemia   Thrombocytopenia (HCC)   CAD (coronary artery disease)   Hypothyroidism   Anemia   Melena   Obesity, Class III, BMI 40-49.9 (morbid obesity) ([place])   Discharge Condition: Stable and overall improved (no further black stools reported).  Patient will be transferred to [facility] for anticipated repeat EGD and TIPS.  Diet recommendation: Modified carbohydrates and low-sodium diet.  Filed Weights   07/04/18 0500 07/05/18 0642 07/06/18 0444  Weight: 97.4 kg 101.1 kg 101.8 kg    History of present illness:  As per H&P written by Dr. Rosalita Chessman on 07/02/18 62 y.o.female,w CAD, CHF, Dm2, Hypothyroidism, Anemia, NASH cirrhosis, esophageal varices, GAVE, high-grade partial throbosis of portal vein, apparently sent to ED by [facility] (where she is suppose to have TIPPS in the near future)due to Hgb 6.4 and black stool though she is on iron. + fatigue. Pt denies fever, chills, cp, palp, sob, n/v, brbpr.   In Ed,  T 97.9, P 83, Bp 111/57 Pox 97%   Wbc 8.6, Hgb 7.0, Plt 92 Na 136, K3.4, Bun 14, Creatinine 1.10  Ast 35, Alt 37, Alk phos 112, T. Bili 2.2 BNP 20.0 Lipase 46  Pt will be admitted for symptomatic anemia and melena.Marland Kitchen  Hospital Course:  1-Symptomatic anemia: in the setting of acute on chronic GI bleed. -had hx of cirrhosis and  GAVE -continue PPI -GI recommended transfer to [facility] and will not attempt EGD here. -Hgb 8.1 after 3 units of PRBC's -follow Hgb trend and continue supportive care.  -Discussed with [facility] transplant team; patient accepted once bed available. Dr. Chesley Noon aware and would be in patient care at receiving facility. -Anticipated plan is for repeat endoscopy and TIPS procedure at receiving facility  2-benign transfusion reaction -will pre-medicate with tylenol prior to transfusions. -has so far tolerated 3 units of PRBC's -Hgb 7.9 this morning.  -repeat CBC tomorrow in a.m. -No further black stools reported.  3-Cirrhosis, nonalcoholic ([place]) -continue torsemide, adjusted dose of spironolactone and half dose inderal -continue Chronulac.  -BP soft, but stable. -Further adjustment to her medications to be done by liver transplant service.  4-Diabetes mellitus ([place]) -continue SSI and lantus -adjust hypoglycemic regimen as needed.   5-Iron deficiency anemia -due to chronic GIB -prior hx of not tolerating IV iron infusions. -Continue intermittent CBC to follow hemoglobin trend. -plan is for TIPS and EGD at receiving facility   6-Thrombocytopenia [facility]) -due to cirrhosis -Follow platelets trend.  7-hx of CAD (coronary artery disease) -continue statins and continue beta-blocker. -No abnormalities appreciated on telemetry in 72 hours.  Telemetry was discontinued.  8-Hypothyroidism -continue synthroid  9-anxiety -continue paxil and buspar -Mood is stable.  10-pulmonary HTN -will continue torsemide as tolerated -BP soft currently, but is stable. -reports improvement in SOB after transfusion; no crackles on exam.  11-class 2 obesity  -Body mass index is 41.05 kg/m. -low calorie diet discussed with patient   Procedures:  See below for  x-ray reports.  Consultations:  GI service  Discharge Exam: Vitals:   07/07/18 0651 07/07/18 1049  BP: (!) 103/44   Pulse: 69    Resp: 19   Temp: 98.3 F (36.8 C)   SpO2: 94% 95%   General exam: Alert, awake, oriented x 3; feeling a slightly stronger and denying any chest pain, nausea, vomiting or significant shortness of breath.  Patient reports no seen any further black stools.  Respiratory system: Clear to auscultation. Respiratory effort normal. Cardiovascular system:RRR. No murmurs, rubs, gallops. Gastrointestinal system: Abdomen is mildly distended, soft and nontender. No organomegaly or masses felt. Normal bowel sounds heard.  Positive fluid wave from ascites appreciated.  No guarding. Central nervous system: Alert and oriented. No focal neurological deficits. Extremities: No cyanosis or clubbing.  1+ edema bilaterally appreciated on exam. Skin: No rashes, no petechiae, no open ulcers.  Patient with bruise on her right upper extremity after infiltrated IV line. Psychiatry: Judgement and insight appear normal. Mood & affect appropriate.    Discharge Instructions   Discharge Instructions    Diet - low sodium heart healthy   Complete by:  As directed    Discharge instructions   Complete by:  As directed    Maintain adequate hydration Follow CBC in 24 hours to reassess hemoglobin trend Receiving facility to accommodate timing for endoscopy and TIPS procedure.   Nursing communication   Complete by:  As directed    Keep IV line in place at time of transfer.     Allergies as of 07/07/2018      Reactions   Feraheme [ferumoxytol] Other (See Comments)   Patient has severe back and chest pain when receiving FERAHEME infusions.   Ultram [tramadol Hcl] Other (See Comments)   WEAKNESS      Medication List    STOP taking these medications   FLINTSTONES PLUS IRON PO     TAKE these medications   busPIRone 5 MG tablet Commonly known as:  BUSPAR Take 5 mg by mouth 2 (two) times daily.   cholecalciferol 1000 units tablet Commonly known as:  VITAMIN D Take 1,000 Units by mouth 3 (three) times a week.    feeding supplement (PRO-STAT SUGAR FREE 64) Liqd Take 30 mLs by mouth 2 (two) times daily.   HUMALOG 100 UNIT/ML injection Generic drug:  insulin lispro Inject 3-12 Units into the skin 3 (three) times daily with meals.   Insulin Glargine 100 UNIT/ML Solostar Pen Commonly known as:  LANTUS Inject 38 Units daily at 10 pm into the skin. What changed:    how much to take  when to take this   Insulin Pen Needle 31G X 5 MM Misc Use with each insulin injection   lactulose 10 GM/15ML solution Commonly known as:  [place] What changed:  See the new instructions.   levothyroxine 25 MCG tablet Commonly known as:  SYNTHROID, LEVOTHROID Take 25 mcg by mouth daily before breakfast.   magnesium oxide 400 MG tablet Commonly known as:  MAG-OX Take 400 mg by mouth daily.   OXYGEN Inhale 2 L into the lungs daily as needed (FOR BREATHING).   pantoprazole 40 MG tablet Commonly known as:  PROTONIX Take 1 tablet (40 mg total) by mouth 2 (two) times daily. What changed:  when to take this   PARoxetine 40 MG tablet Commonly known as:  PAXIL Take 40 mg by mouth every morning.   potassium chloride 10 MEQ tablet Commonly known  as:  K-DUR Take 10 mEq by mouth 2 (two) times daily.   propranolol 10 MG tablet Commonly known as:  INDERAL Take 1 tablet (10 mg total) by mouth daily. Start taking on:  07/08/2018 What changed:    medication strength  how much to take  when to take this   rosuvastatin 5 MG tablet Commonly known as:  CRESTOR Take 1 tablet (5 mg total) by mouth every other day.   spironolactone 50 MG tablet Commonly known as:  ALDACTONE Take 0.5 tablets (25 mg total) by mouth 2 (two) times daily. What changed:  how much to take   torsemide 20 MG tablet Commonly known as:  DEMADEX Take 1 tablet (20 mg total) by mouth daily.   trimethoprim-polymyxin b ophthalmic solution Commonly known as:  POLYTRIM Place 1 drop into the left  eye See admin instructions. Use 3 to 4 times daily for 2 days following monthly eye injection      Allergies  Allergen Reactions  . Feraheme [Ferumoxytol] Other (See Comments)    Patient has severe back and chest pain when receiving FERAHEME infusions.  Marland Kitchen Ultram [Tramadol Hcl] Other (See Comments)    WEAKNESS    The results of significant diagnostics from this hospitalization (including imaging, microbiology, ancillary and laboratory) are listed below for reference.    Labs: Basic Metabolic Panel: Recent Labs  Lab 07/02/18 1736 07/03/18 0515 07/06/18 0720  NA 136 134* 137  K 3.4* 3.5 3.6  CL 106 104 108  CO2 21* 22 23  GLUCOSE 138* 189* 179*  BUN 14 16 16   CREATININE 1.10* 1.18* 1.05*  CALCIUM 8.0* 7.8* 7.5*   Liver Function Tests: Recent Labs  Lab 07/02/18 1736 07/03/18 0515 07/06/18 0720  AST 35 31 31  ALT 37 31 28  ALKPHOS 112 95 82  BILITOT 2.2* 2.6* 2.2*  PROT 6.5 5.7* 5.0*  ALBUMIN 2.7* 2.4* 2.1*   Recent Labs  Lab 07/02/18 1736  LIPASE 46   CBC: Recent Labs  Lab 07/02/18 1736 07/03/18 0515 07/04/18 0506 07/04/18 1419 07/05/18 0547 07/06/18 0720 07/07/18 0652  WBC 8.6 5.0 4.6  --  3.5* 3.5* 3.1*  NEUTROABS 6.8  --   --   --   --  2.0  --   HGB 7.0* 7.1* 6.2* 6.7* 7.8* 8.1* 7.9*  HCT 26.4* 26.3* 21.5* 23.7* 26.3* 27.6* 27.5*  MCV 86.8 88.6 85.7  --  86.5 85.4 85.7  PLT 92* 56* 39*  --  44* 38* 38*   BNP (last 3 results) Recent Labs    07/02/18 1736  BNP 20.0   CBG: Recent Labs  Lab 07/06/18 1152 07/06/18 1709 07/06/18 2139 07/07/18 0808 07/07/18 1209  GLUCAP 167* 219* 203* 150* 226*    Signed:  Barton Dubois MD.  [facility] 07/07/2018, 1:10 PM

## 2018-07-08 DIAGNOSIS — K31811 Angiodysplasia of stomach and duodenum with bleeding: Principal | ICD-10-CM

## 2018-07-10 MED ORDER — RIFAXIMIN 550 MG TABLET
ORAL_TABLET | Freq: Two times a day (BID) | ORAL | 0 refills | 0.00000 days
Start: 2018-07-10 — End: 2018-07-10

## 2018-07-12 MED ORDER — POLYETHYLENE GLYCOL 3350 17 G PO PACK
17.00 | PACK | ORAL | Status: DC
Start: ? — End: 2018-07-12

## 2018-07-12 MED ORDER — POTASSIUM CHLORIDE CRYS ER 10 MEQ PO TBCR
10.00 | EXTENDED_RELEASE_TABLET | ORAL | Status: DC
Start: 2018-07-11 — End: 2018-07-12

## 2018-07-12 MED ORDER — PAROXETINE HCL 20 MG PO TABS
40.00 | ORAL_TABLET | ORAL | Status: DC
Start: 2018-07-12 — End: 2018-07-12

## 2018-07-12 MED ORDER — MAGNESIUM OXIDE 400 MG PO TABS
400.00 | ORAL_TABLET | ORAL | Status: DC
Start: 2018-07-12 — End: 2018-07-12

## 2018-07-12 MED ORDER — MENTHOL 9.1 MG MT LOZG
1.00 | LOZENGE | OROMUCOSAL | Status: DC
Start: ? — End: 2018-07-12

## 2018-07-12 MED ORDER — SPIRONOLACTONE 25 MG PO TABS
50.00 | ORAL_TABLET | ORAL | Status: DC
Start: 2018-07-12 — End: 2018-07-12

## 2018-07-12 MED ORDER — TORSEMIDE 20 MG PO TABS
20.00 | ORAL_TABLET | ORAL | Status: DC
Start: 2018-07-12 — End: 2018-07-12

## 2018-07-12 MED ORDER — INSULIN GLARGINE 100 UNIT/ML ~~LOC~~ SOLN
35.00 | SUBCUTANEOUS | Status: DC
Start: ? — End: 2018-07-12

## 2018-07-12 MED ORDER — CHOLECALCIFEROL 25 MCG (1000 UT) PO TABS
1000.00 | ORAL_TABLET | ORAL | Status: DC
Start: 2018-07-12 — End: 2018-07-12

## 2018-07-12 MED ORDER — MELATONIN 3 MG PO TABS
3.00 | ORAL_TABLET | ORAL | Status: DC
Start: ? — End: 2018-07-12

## 2018-07-12 MED ORDER — OXYCODONE HCL 5 MG PO TABS
2.50 | ORAL_TABLET | ORAL | Status: DC
Start: ? — End: 2018-07-12

## 2018-07-12 MED ORDER — PANTOPRAZOLE SODIUM 40 MG PO TBEC
40.00 | DELAYED_RELEASE_TABLET | ORAL | Status: DC
Start: 2018-07-11 — End: 2018-07-12

## 2018-07-12 MED ORDER — ALUM & MAG HYDROXIDE-SIMETH 400-400-40 MG/5ML PO SUSP
30.00 | ORAL | Status: DC
Start: ? — End: 2018-07-12

## 2018-07-12 MED ORDER — LEVOTHYROXINE SODIUM 25 MCG PO TABS
25.00 | ORAL_TABLET | ORAL | Status: DC
Start: 2018-07-12 — End: 2018-07-12

## 2018-07-12 MED ORDER — LACTULOSE 10 GM/15ML PO SOLN
20.00 | ORAL | Status: DC
Start: 2018-07-11 — End: 2018-07-12

## 2018-07-12 MED ORDER — INSULIN REGULAR HUMAN 100 UNIT/ML IJ SOLN
.00 | INTRAMUSCULAR | Status: DC
Start: 2018-07-11 — End: 2018-07-12

## 2018-07-12 MED ORDER — ATORVASTATIN CALCIUM 10 MG PO TABS
10.00 | ORAL_TABLET | ORAL | Status: DC
Start: 2018-07-11 — End: 2018-07-12

## 2018-07-12 MED ORDER — BUSPIRONE HCL 10 MG PO TABS
5.00 | ORAL_TABLET | ORAL | Status: DC
Start: 2018-07-11 — End: 2018-07-12

## 2018-07-12 MED ORDER — ACETAMINOPHEN 325 MG PO TABS
325.00 | ORAL_TABLET | ORAL | Status: DC
Start: ? — End: 2018-07-12

## 2018-07-12 MED ORDER — PHENOL 1.4 % MT LIQD
2.00 | OROMUCOSAL | Status: DC
Start: ? — End: 2018-07-12

## 2018-07-13 DIAGNOSIS — Z48815 Encounter for surgical aftercare following surgery on the digestive system: Secondary | ICD-10-CM | POA: Diagnosis not present

## 2018-07-13 DIAGNOSIS — M797 Fibromyalgia: Secondary | ICD-10-CM | POA: Diagnosis not present

## 2018-07-13 DIAGNOSIS — Z9181 History of falling: Secondary | ICD-10-CM | POA: Diagnosis not present

## 2018-07-13 DIAGNOSIS — F329 Major depressive disorder, single episode, unspecified: Secondary | ICD-10-CM | POA: Diagnosis not present

## 2018-07-13 DIAGNOSIS — K219 Gastro-esophageal reflux disease without esophagitis: Secondary | ICD-10-CM | POA: Diagnosis not present

## 2018-07-13 DIAGNOSIS — E7800 Pure hypercholesterolemia, unspecified: Secondary | ICD-10-CM | POA: Diagnosis not present

## 2018-07-13 DIAGNOSIS — E039 Hypothyroidism, unspecified: Secondary | ICD-10-CM | POA: Diagnosis not present

## 2018-07-13 DIAGNOSIS — I10 Essential (primary) hypertension: Secondary | ICD-10-CM | POA: Diagnosis not present

## 2018-07-13 DIAGNOSIS — E119 Type 2 diabetes mellitus without complications: Secondary | ICD-10-CM | POA: Diagnosis not present

## 2018-07-13 DIAGNOSIS — K700 Alcoholic fatty liver: Secondary | ICD-10-CM | POA: Diagnosis not present

## 2018-07-13 DIAGNOSIS — M810 Age-related osteoporosis without current pathological fracture: Secondary | ICD-10-CM | POA: Diagnosis not present

## 2018-07-13 DIAGNOSIS — D500 Iron deficiency anemia secondary to blood loss (chronic): Secondary | ICD-10-CM | POA: Diagnosis not present

## 2018-07-13 DIAGNOSIS — Z794 Long term (current) use of insulin: Secondary | ICD-10-CM | POA: Diagnosis not present

## 2018-07-15 ENCOUNTER — Other Ambulatory Visit: Payer: Self-pay | Admitting: Pharmacist

## 2018-07-15 ENCOUNTER — Ambulatory Visit: Payer: Self-pay | Admitting: Pharmacist

## 2018-07-15 LAB — TRANSFUSION REACTION
DAT C3: NEGATIVE
Post RXN DAT IgG: NEGATIVE

## 2018-07-15 IMAGING — MR MR HEAD W/O CM
8 of 9 series · 40 of 48 positions shown · non-contrast
Comparison: Head CT from earlier today

CLINICAL DATA: Altered mental status for 2 days

EXAM:
MRI HEAD WITHOUT CONTRAST
TECHNIQUE: Multiplanar, multiecho pulse sequences of the brain and surrounding
structures were obtained without intravenous contrast.

[Series 3: t1_fl2d_sag · sagittal · 5.0mm · 0.39mm/px · 1 of 20 slices shown]
[im 1/20]
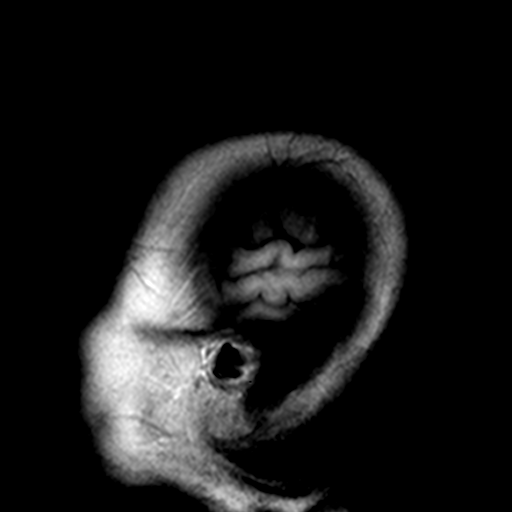

[Series 4: DWI · axial · 3.0mm · 0.67mm/px · z∈[-112,+50]mm · 7 of 55 slices shown (1 of 3)]
[im 1/55]
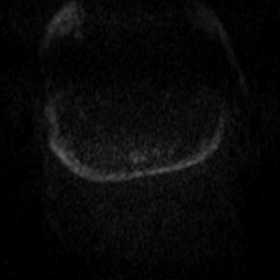
[im 10/55]
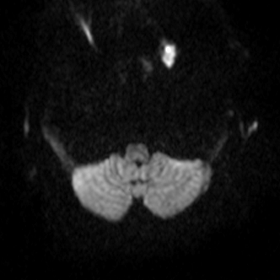
[im 19/55]
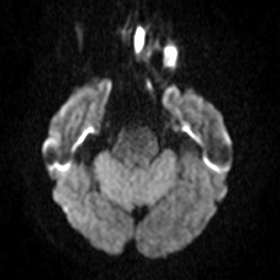
[im 28/55]
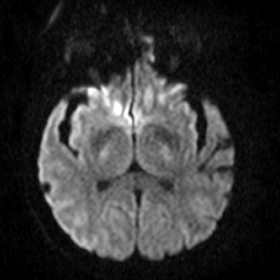
[im 37/55]
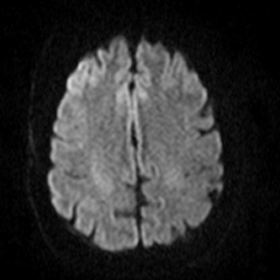
[im 46/55]
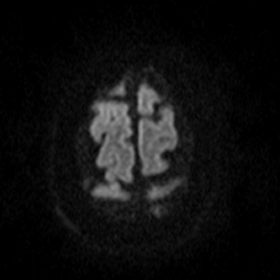
[im 55/55]
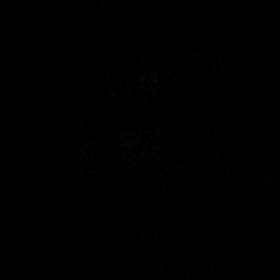

[Series 5: DWI · axial · 3.0mm · 0.74mm/px · z∈[-111,+48]mm · 7 of 52 slices shown (2 of 3)]
[im 1/52]
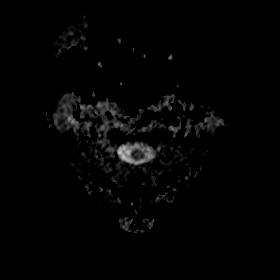
[im 9/52]
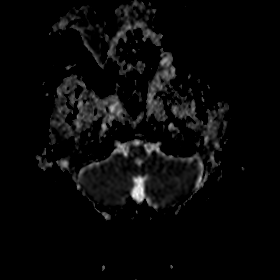
[im 18/52]
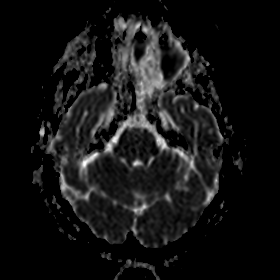
[im 26/52]
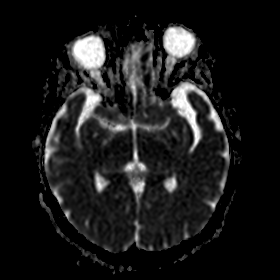
[im 35/52]
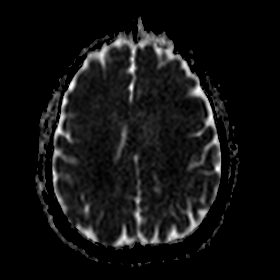
[im 43/52]
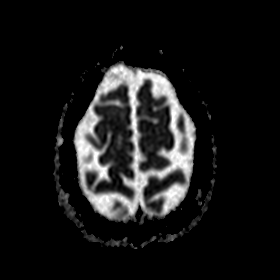
[im 52/52]
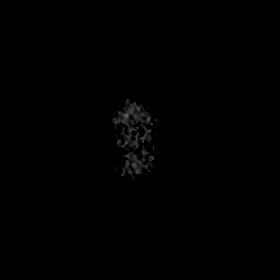

[Series 6: DWI · coronal · 5.0mm · 0.44mm/px · 4 of 31 slices shown (3 of 3)]
[im 1/31]
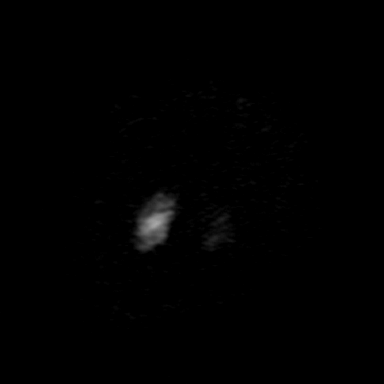
[im 11/31]
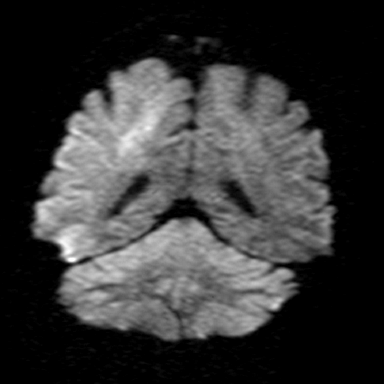
[im 21/31]
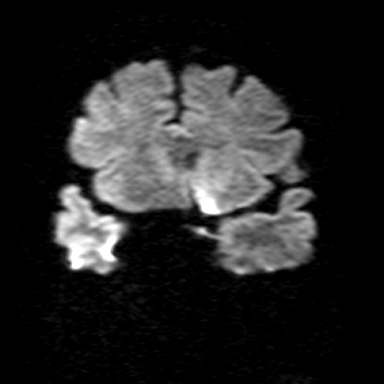
[im 31/31]
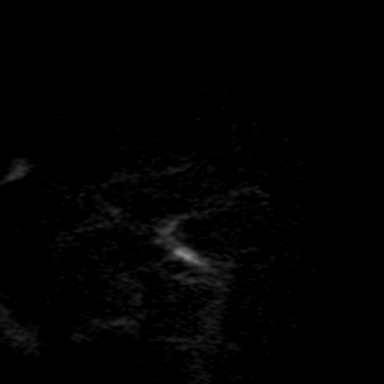

[Series 8: T2 · axial · 5.0mm · 0.65mm/px · z∈[-102,+41]mm · 3 of 23 slices shown (1 of 2)]
[im 1/23]
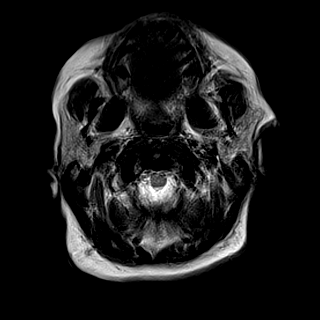
[im 12/23]
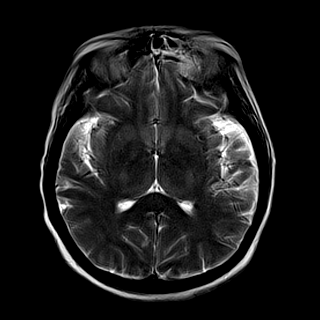
[im 23/23]
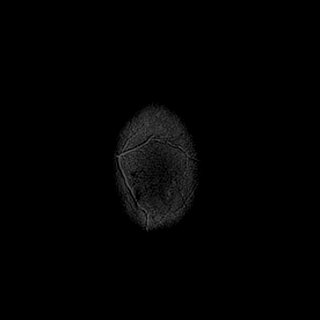

[Series 9: FLAIR · axial · 3.0mm · 0.79mm/px · z∈[-99,+38]mm · 6 of 47 slices shown]
[im 1/47]
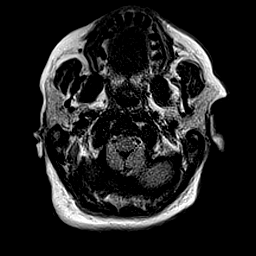
[im 10/47]
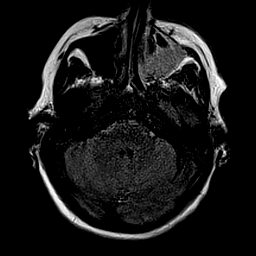
[im 19/47]
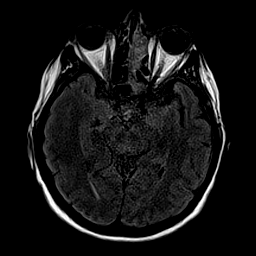
[im 28/47]
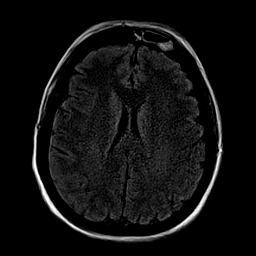
[im 37/47]
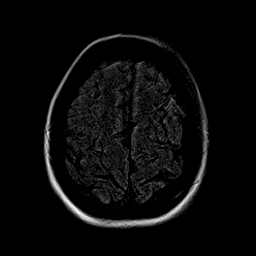
[im 47/47]
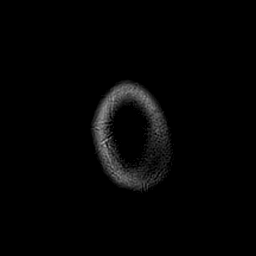

[Series 10: T1 · axial · 2.0mm · 0.40mm/px · z∈[-117,+43]mm · 8 of 81 slices shown]
[im 1/81]
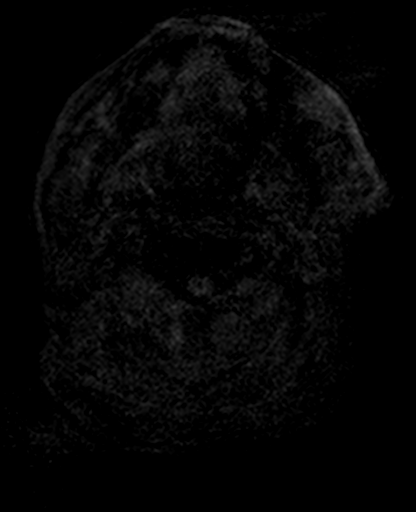
[im 17/81]
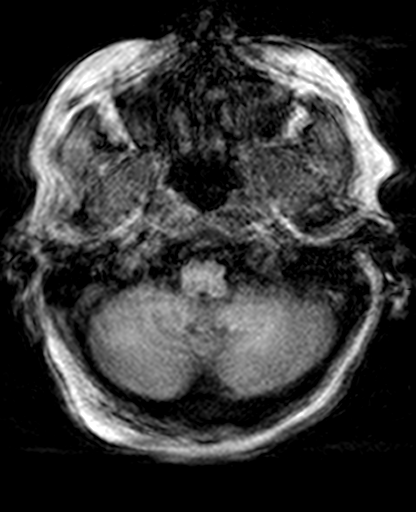
[im 25/81]
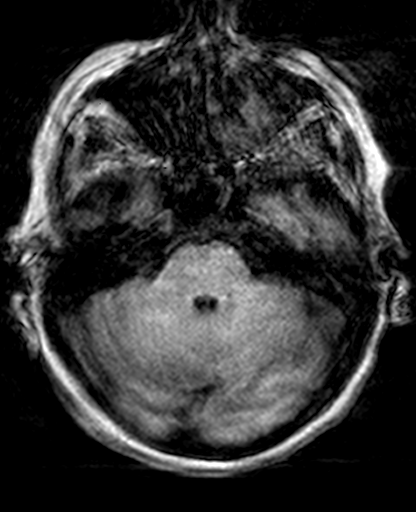
[im 33/81]
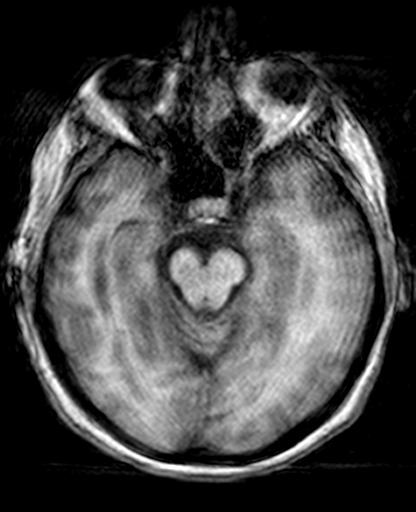
[im 49/81]
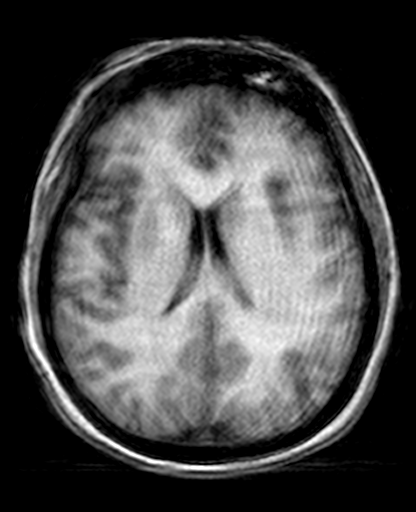
[im 57/81]
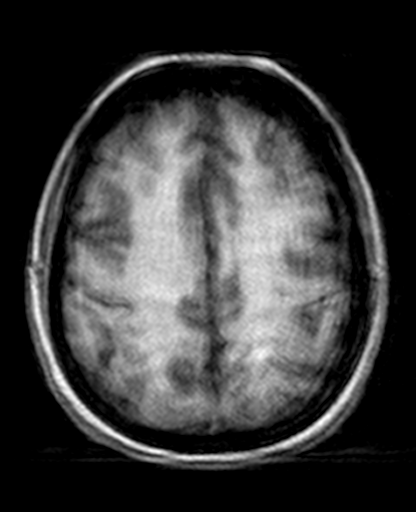
[im 65/81]
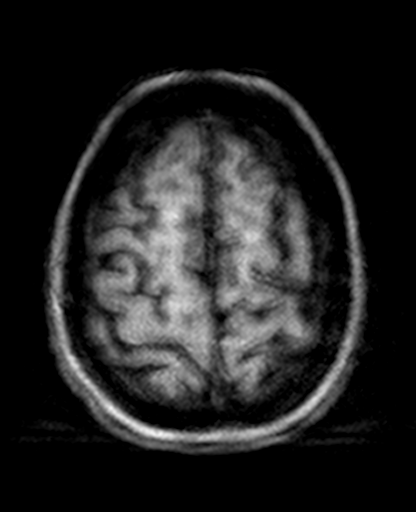
[im 81/81]
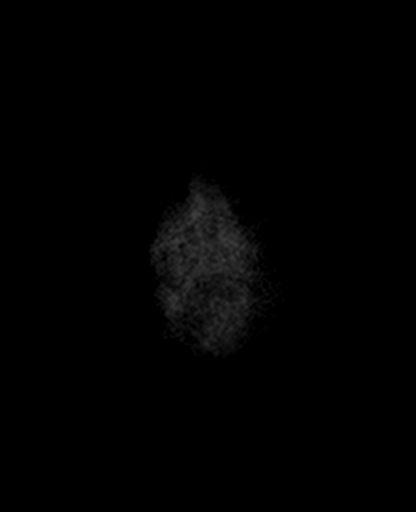

[Series 12: T2 · coronal · 5.0mm · 0.63mm/px · 4 of 28 slices shown (2 of 2)]
[im 1/28]
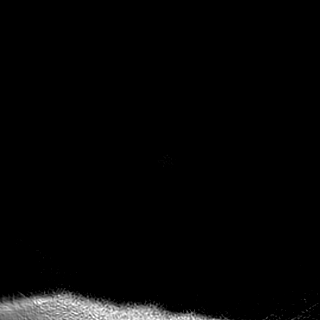
[im 10/28]
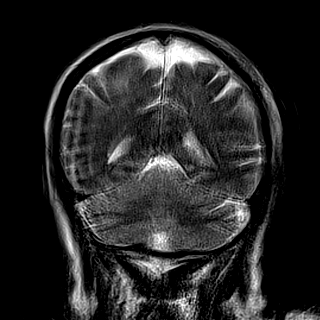
[im 19/28]
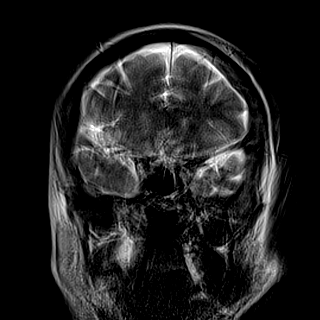
[im 28/28]
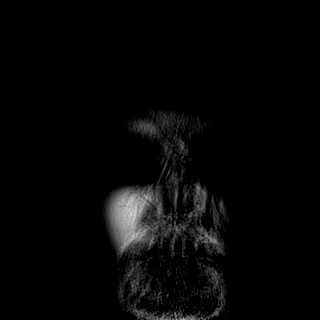

[40 of 48 positions shown; findings below may reference images not displayed]

FINDINGS: Brain: No acute infarction or evidence of hemorrhage, hydrocephalus,
extra-axial collection or mass lesion. T1 hyperintensity within
globus pallidi attributed to the patient's cirrhosis. Normal brain
volume.

Vascular: No detectable abnormality.

Skull and upper cervical spine: Hypointense appearance of marrow
which is nonspecific. No focal mass is seen.

Sinuses/Orbits: Left maxillary, ethmoid, and frontal sinusitis due
to middle meatus obstruction. There are scattered fluid levels.

Other: Significantly motion degraded study which could obscure
pathology.
IMPRESSION: 1. Significantly motion degraded study which could obscure
pathology. Fortunately, diffusion imaging is diagnostic and negative
for infarct, as questioned on preceding head CT.
2. Left middle meatus obstruction with sinusitis, potentially acute.
3. Diffusely hypointense marrow signal. Please correlate with CBC
and iron studies.
4. Altered signal in the basal ganglia likely from patient's
cirrhosis.

## 2018-07-15 NOTE — Patient Outreach (Signed)
[facility]) Care Management  [place]  07/15/2018  Maria [name] 1956-05-26 [id number]   Reason for referral: medication reconciliation post discharge  Unsuccessful telephone call attempt #1 to patient.   HIPAA compliant voicemail left requesting a return call  Plan:  I will make another outreach attempt to patient within 3-4 business days    [name], PharmD, [place]  [phone]

## 2018-07-16 ENCOUNTER — Other Ambulatory Visit: Payer: Self-pay

## 2018-07-16 NOTE — Patient Outreach (Signed)
[facility]) Care Management  07/16/2018  Maria [name] 04-18-1956 [id number]  Transition of care  Referral date: 07/15/18 Referral source: [facility] hospital at [facility] discharge on 07/11/18 Insurance: HTA Attempt #2   Telephone call to patient regarding referral. Unable to reach patient. HIPAA compliant voice message left with call back phone number.   PLAN: RNCM will attempt 2nd telephone call to patient within 4 business days. RNCM will send outreach letter.   [name] RN,BSN, [place] Telephonic  [phone]

## 2018-07-17 ENCOUNTER — Other Ambulatory Visit: Payer: Self-pay | Admitting: Pharmacist

## 2018-07-17 ENCOUNTER — Ambulatory Visit: Payer: Self-pay | Admitting: Pharmacist

## 2018-07-17 DIAGNOSIS — K7460 Unspecified cirrhosis of liver: Secondary | ICD-10-CM | POA: Diagnosis not present

## 2018-07-17 DIAGNOSIS — R0609 Other forms of dyspnea: Secondary | ICD-10-CM | POA: Diagnosis not present

## 2018-07-17 DIAGNOSIS — E1165 Type 2 diabetes mellitus with hyperglycemia: Secondary | ICD-10-CM | POA: Diagnosis not present

## 2018-07-17 DIAGNOSIS — Z6834 Body mass index (BMI) 34.0-34.9, adult: Secondary | ICD-10-CM | POA: Diagnosis not present

## 2018-07-17 DIAGNOSIS — Z299 Encounter for prophylactic measures, unspecified: Secondary | ICD-10-CM | POA: Diagnosis not present

## 2018-07-17 DIAGNOSIS — D649 Anemia, unspecified: Secondary | ICD-10-CM | POA: Diagnosis not present

## 2018-07-17 DIAGNOSIS — I10 Essential (primary) hypertension: Secondary | ICD-10-CM | POA: Diagnosis not present

## 2018-07-17 NOTE — Patient Outreach (Signed)
[facility]) Care Management  [place]  07/17/2018  ATHLEEN FELTNER 03-31-1956 [id number]   Reason for referral: medication reconciliation post discharge  Unsuccessful telephone call attempt #2 to patient.   HIPAA compliant voicemail left requesting a return call  Plan:  I will make another outreach attempt to patient within 3-4 business days   [name], PharmD, [place]  [phone]

## 2018-07-18 ENCOUNTER — Other Ambulatory Visit: Payer: Self-pay | Admitting: Pharmacist

## 2018-07-19 ENCOUNTER — Ambulatory Visit: Payer: Self-pay | Admitting: Pharmacist

## 2018-07-22 ENCOUNTER — Other Ambulatory Visit: Payer: Self-pay

## 2018-07-22 NOTE — Patient Outreach (Addendum)
[name]) Care Management  07/22/2018  [name] 23-Jun-1956 [id number]   PMHx:CAD, PulmHTN, esophageal varices, GERD, cirrhosis/NAFLD (nonalcoholic), h/o GIB, DM, hypothyroidism, IDA  HPI:62 yoF [name]18 and referred for post-discharge medication review.  Successful outreach to [name] with HIPAA identifiers verified x2.  Patient states that she is doing "better" since she has been home versus in the [name].  She states several medications were discontinued and some were changed.  Patient agreeable to review medications telephonically.  Medications Reviewed Today    Reviewed by Lavera Guise, [facility] (Pharmacist) on 07/18/18 at 1027  Med List Status: <None>  Medication Order Taking? Sig Documenting Provider Last Dose Status Informant  Amino Acids-Protein Hydrolys (FEEDING SUPPLEMENT, PRO-STAT SUGAR FREE 64,) LIQD [id number] Yes Take 30 mLs by mouth 2 (two) times daily. Barton Dubois, MD Taking Active   busPIRone (BUSPAR) 5 MG tablet [id number] Yes Take 5 mg by mouth 2 (two) times daily. [provider] Taking Active Self  cholecalciferol (VITAMIN D) 1000 UNITS tablet [id number] Yes Take 1,000 Units by mouth 3 (three) times a week.  [provider] Taking Active Self  HUMALOG 100 UNIT/ML injection [id number] Yes Inject 3-12 Units into the skin 3 (three) times daily with meals.  [provider] Taking Active Self           Med Note [name]   Tue Jul 02, 2018  7:18 PM) 12 units this morning  Insulin Glargine (LANTUS SOLOSTAR) 100 UNIT/ML Solostar Pen [id number] Yes Inject 38 Units daily at 10 pm into the skin.  Patient taking differently:  Inject 35 Units into the skin every morning.    Maria Bliss, [name], MD Taking Active Self           Med Note [name] Jul 18, 2018 10:16 AM) Patient taking 35 units in the AM   Insulin Pen Needle 31G X 5 MM MISC [id number] Yes  Use with each insulin injection Maria Bliss, [name], MD Taking Active Self  lactulose [facility]) 10 GM/15ML solution [id number] Yes TAKE 30 MLS BY MOUTH FOUR TIMES DAILY  Patient taking differently:  Take 20 g by mouth daily as needed for mild constipation or moderate constipation.    Maria Houston, MD Taking Active Self  levothyroxine (SYNTHROID, LEVOTHROID) 25 MCG tablet [id number] Yes Take 25 mcg by mouth daily before breakfast.  [provider] Taking Active Self  magnesium oxide (MAG-OX) 400 MG tablet [id number] Yes Take 400 mg by mouth daily.  [provider] Taking Active Self           Med Note [name]   Tue Jul 02, 2018  5:46 PM)    multivitamin (VIT W/EXTRA C) CHEW chewable tablet [id number] Yes Chew 1 tablet by mouth daily. [provider] Taking Active            Med Note [name] Jul 18, 2018 10:22 AM) Flintstones w/ Iron-patient can't tolerate any other MVI  OXYGEN [id number] Yes Inhale 2 L into the lungs daily as needed (FOR BREATHING).  [provider] Taking Active Self  pantoprazole (PROTONIX) 40 MG tablet [id number] Yes Take 1 tablet (40 mg total) by mouth 2 (two) times daily.  Patient taking differently:  Take 40 mg by mouth daily.    Barton Dubois, MD Taking Active   PARoxetine (PAXIL) 40 MG tablet [id number] Yes Take 40 mg by  mouth every morning. [provider] Taking Active Self  potassium chloride (K-DUR) 10 MEQ tablet [id number] Yes Take 10 mEq by mouth 2 (two) times daily.  [provider] Taking Active Self  rosuvastatin (CRESTOR) 5 MG tablet [id number] Yes Take 1 tablet (5 mg total) by mouth every other day. [name], MD Taking Active Self  spironolactone (ALDACTONE) 50 MG tablet [id number] Yes Take 0.5 tablets (25 mg total) by mouth 2 (two) times daily.  Patient taking differently:  Take 25 mg by mouth daily.    Barton Dubois, MD Taking Active   torsemide [facility]) 20 MG tablet  [id number] Yes Take 1 tablet (20 mg total) by mouth daily. [name], MD Taking Active Self  trimethoprim-polymyxin b [name]) ophthalmic solution [id number] Yes Place 1 drop into the left eye See admin instructions. Use 3 to 4 times daily for 2 days following monthly eye injection [provider] Taking Active Self           Med Note [name]   Tue Jul 02, 2018  7:16 PM) Injection was received on 06/26/2018          ASSESSMENT: [facility] Dis[name]:  Date Medication Reconciliation Performed: 07/18/2018  Medications Discontinued at Discharge:   propranolol  New Medications at Discharge: n/a  No new medications were prescribed at discharge.  [facility] was recently [name] and all medications have been reviewed   Drugs sorted by system:  Neurologic/Psychologic: buspirone, Paxil  Cardiovascular: rosuvastatin, spironolactone, torsemide  Gastrointestinal: Lactulose, pantoprazole  Endocrine: Humalog, Lantus, levothyroxine  Vitamins/Minerals: vit D, magnesium, MVI, potassium  Other issues: Patient mentioned that she was taking Xifaxin in the [name], but not on discharge.  Informed patient that we could try to request a non-formulary exception through her insurance.  Callback information was given if she is to be prescribed this medication in the future.  MD is titrating lactulose in the meantime.  Often times both therapies are warranted in advanced cirrhrosis   PLAN: -Instructed patient to take medications as prescribed and discontinue old medications as prescribed -Encouraged patient to call [facility] Pharmacist if she is in need of Xifaxin assistance as this medication would require NF exception   [name], PharmD, [place]  [phone]

## 2018-07-22 NOTE — Patient Outreach (Signed)
[facility]) Care Management  07/22/2018  [name] 19-May-1956 [id number]  Transition of care  Referral date: 07/15/18 Referral source: [facility] hospital at [facility] discharge on 07/11/18 Insurance: HTA Attempt #2   Telephone call to patient regarding referral. Unable to reach patient. HIPAA compliant voice message left with call back phone number.   PLAN: RNCM will attempt 3rd telephone call to patient within 4 business days. RNCM will send outreach letter.   [name] RN,BSN, [place] Telephonic  [phone]

## 2018-07-23 ENCOUNTER — Ambulatory Visit: Payer: Self-pay

## 2018-07-24 ENCOUNTER — Encounter (INDEPENDENT_AMBULATORY_CARE_PROVIDER_SITE_OTHER): Payer: PPO | Admitting: Ophthalmology

## 2018-07-24 ENCOUNTER — Other Ambulatory Visit: Payer: Self-pay

## 2018-07-24 DIAGNOSIS — I10 Essential (primary) hypertension: Secondary | ICD-10-CM | POA: Diagnosis not present

## 2018-07-24 DIAGNOSIS — H2513 Age-related nuclear cataract, bilateral: Secondary | ICD-10-CM

## 2018-07-24 DIAGNOSIS — H35033 Hypertensive retinopathy, bilateral: Secondary | ICD-10-CM

## 2018-07-24 DIAGNOSIS — H4313 Vitreous hemorrhage, bilateral: Secondary | ICD-10-CM

## 2018-07-24 DIAGNOSIS — E113312 Type 2 diabetes mellitus with moderate nonproliferative diabetic retinopathy with macular edema, left eye: Secondary | ICD-10-CM

## 2018-07-24 DIAGNOSIS — E11311 Type 2 diabetes mellitus with unspecified diabetic retinopathy with macular edema: Secondary | ICD-10-CM | POA: Diagnosis not present

## 2018-07-24 DIAGNOSIS — E113391 Type 2 diabetes mellitus with moderate nonproliferative diabetic retinopathy without macular edema, right eye: Secondary | ICD-10-CM | POA: Diagnosis not present

## 2018-07-24 NOTE — Patient Outreach (Signed)
[name]) Care Management  07/24/2018  BRITTANEE GHAZARIAN June 21, 1956 [id number]  Transition of care  Referral date:07/15/18 Referral source:[facility] discharge on 07/11/18 Insurance:HTA  Telephone call to patient regarding transition of care referral. HIPAA verified with patient. Explained reason for call. Patient states she was in the hospital for anemia and TIP procedure. Patient states she is on the waiting list for a liver transfusion. Patient states she is having symptoms on the left side of her upper neck from the IV line that was in. Patient states the area is tender and has some redness and drainage.  Patient reports its not getting better but its not getting worse either. Patient states there seems to be more drainage from the area in the morning and it feels like there is some tunneling. Patient states she called [facility] home health [facility] with [facility] late yesterday afternoon. Patient states the home health nurse is scheduled to come out on tomorrow to see her.  RNCM advised patient to call her doctor today and report the symptoms she is having.  Patient  Verbalized understanding.   Patient states she saw her primary MD approximately 1 week after discharge from the hospital. Patient states she is scheduled to follow up in [place] on 08/08/18 and will see approximately 3-4 doctors to discuss ongoing process regarding liver transplant. Patient states she will also have a TIP scan done. Patient states she has good support from her spouse. She states she has transportation to her appointments. Patient reports having her medications and taking them as prescribed.  Patient states she has spoken with her endocrinologist office.  She states she has been diabetic for 10-15 years and is being followed closely by her doctor.  RNCM discussed and offered ongoing transition of care follow up. Patient declined stating she felt she was managing her care at  this time. RNCM offered to send patient [facility] care management brochure/ magnet. Patient verbally agreed.  RNCM advised patient to notify MD of any changes in condition prior to scheduled appointment. RNCM offered to give patient 24 hour nurse line contact number.  Patient requested the information be mailed to her.  RNCM verified patient aware of 911 services for urgent/ emergent needs.  PLAN: RNCM will close patient due to refusal of services.  RNCm will send patient [facility] care management brochure/ magnet as discussed RNCM will send patients primary MD closure letter.   [name] RN,BSN,CCM [facility] Telephonic  [phone]

## 2018-07-25 DIAGNOSIS — D500 Iron deficiency anemia secondary to blood loss (chronic): Secondary | ICD-10-CM | POA: Diagnosis not present

## 2018-07-25 DIAGNOSIS — Z9181 History of falling: Secondary | ICD-10-CM | POA: Diagnosis not present

## 2018-07-25 DIAGNOSIS — E119 Type 2 diabetes mellitus without complications: Secondary | ICD-10-CM | POA: Diagnosis not present

## 2018-07-25 DIAGNOSIS — I10 Essential (primary) hypertension: Secondary | ICD-10-CM | POA: Diagnosis not present

## 2018-07-25 DIAGNOSIS — E039 Hypothyroidism, unspecified: Secondary | ICD-10-CM | POA: Diagnosis not present

## 2018-07-25 DIAGNOSIS — F329 Major depressive disorder, single episode, unspecified: Secondary | ICD-10-CM | POA: Diagnosis not present

## 2018-07-25 DIAGNOSIS — K219 Gastro-esophageal reflux disease without esophagitis: Secondary | ICD-10-CM | POA: Diagnosis not present

## 2018-07-25 DIAGNOSIS — Z794 Long term (current) use of insulin: Secondary | ICD-10-CM | POA: Diagnosis not present

## 2018-07-25 DIAGNOSIS — Z48815 Encounter for surgical aftercare following surgery on the digestive system: Secondary | ICD-10-CM | POA: Diagnosis not present

## 2018-07-25 DIAGNOSIS — E7800 Pure hypercholesterolemia, unspecified: Secondary | ICD-10-CM | POA: Diagnosis not present

## 2018-07-25 DIAGNOSIS — M810 Age-related osteoporosis without current pathological fracture: Secondary | ICD-10-CM | POA: Diagnosis not present

## 2018-07-25 DIAGNOSIS — K700 Alcoholic fatty liver: Secondary | ICD-10-CM | POA: Diagnosis not present

## 2018-07-25 DIAGNOSIS — M797 Fibromyalgia: Secondary | ICD-10-CM | POA: Diagnosis not present

## 2018-07-26 ENCOUNTER — Inpatient Hospital Stay (HOSPITAL_COMMUNITY)
Admission: EM | Admit: 2018-07-26 | Discharge: 2018-07-31 | DRG: 442 | Disposition: A | Discharge status: Home Health | Payer: PPO | Attending: Internal Medicine | Admitting: Internal Medicine

## 2018-07-26 ENCOUNTER — Encounter (HOSPITAL_COMMUNITY): Payer: Self-pay | Admitting: *Deleted

## 2018-07-26 ENCOUNTER — Other Ambulatory Visit: Payer: Self-pay

## 2018-07-26 DIAGNOSIS — Z6841 Body Mass Index (BMI) 40.0 and over, adult: Secondary | ICD-10-CM | POA: Diagnosis not present

## 2018-07-26 DIAGNOSIS — D509 Iron deficiency anemia, unspecified: Secondary | ICD-10-CM | POA: Diagnosis not present

## 2018-07-26 DIAGNOSIS — K766 Portal hypertension: Secondary | ICD-10-CM | POA: Diagnosis not present

## 2018-07-26 DIAGNOSIS — Z794 Long term (current) use of insulin: Secondary | ICD-10-CM

## 2018-07-26 DIAGNOSIS — F419 Anxiety disorder, unspecified: Secondary | ICD-10-CM | POA: Diagnosis present

## 2018-07-26 DIAGNOSIS — Z833 Family history of diabetes mellitus: Secondary | ICD-10-CM

## 2018-07-26 DIAGNOSIS — Z90722 Acquired absence of ovaries, bilateral: Secondary | ICD-10-CM

## 2018-07-26 DIAGNOSIS — F329 Major depressive disorder, single episode, unspecified: Secondary | ICD-10-CM | POA: Diagnosis present

## 2018-07-26 DIAGNOSIS — I272 Pulmonary hypertension, unspecified: Secondary | ICD-10-CM | POA: Diagnosis not present

## 2018-07-26 DIAGNOSIS — M797 Fibromyalgia: Secondary | ICD-10-CM | POA: Diagnosis not present

## 2018-07-26 DIAGNOSIS — I5032 Chronic diastolic (congestive) heart failure: Secondary | ICD-10-CM | POA: Diagnosis not present

## 2018-07-26 DIAGNOSIS — Z885 Allergy status to narcotic agent status: Secondary | ICD-10-CM

## 2018-07-26 DIAGNOSIS — E7220 Disorder of urea cycle metabolism, unspecified: Secondary | ICD-10-CM | POA: Diagnosis present

## 2018-07-26 DIAGNOSIS — E876 Hypokalemia: Secondary | ICD-10-CM | POA: Diagnosis not present

## 2018-07-26 DIAGNOSIS — D649 Anemia, unspecified: Secondary | ICD-10-CM | POA: Diagnosis present

## 2018-07-26 DIAGNOSIS — I110 Hypertensive heart disease with heart failure: Secondary | ICD-10-CM | POA: Diagnosis present

## 2018-07-26 DIAGNOSIS — D6959 Other secondary thrombocytopenia: Secondary | ICD-10-CM | POA: Diagnosis not present

## 2018-07-26 DIAGNOSIS — R110 Nausea: Secondary | ICD-10-CM | POA: Diagnosis not present

## 2018-07-26 DIAGNOSIS — E039 Hypothyroidism, unspecified: Secondary | ICD-10-CM | POA: Diagnosis present

## 2018-07-26 DIAGNOSIS — K7290 Hepatic failure, unspecified without coma: Secondary | ICD-10-CM | POA: Diagnosis not present

## 2018-07-26 DIAGNOSIS — Z9981 Dependence on supplemental oxygen: Secondary | ICD-10-CM

## 2018-07-26 DIAGNOSIS — Z801 Family history of malignant neoplasm of trachea, bronchus and lung: Secondary | ICD-10-CM

## 2018-07-26 DIAGNOSIS — E1165 Type 2 diabetes mellitus with hyperglycemia: Secondary | ICD-10-CM | POA: Diagnosis not present

## 2018-07-26 DIAGNOSIS — K219 Gastro-esophageal reflux disease without esophagitis: Secondary | ICD-10-CM | POA: Diagnosis present

## 2018-07-26 DIAGNOSIS — K7460 Unspecified cirrhosis of liver: Secondary | ICD-10-CM | POA: Diagnosis present

## 2018-07-26 DIAGNOSIS — M810 Age-related osteoporosis without current pathological fracture: Secondary | ICD-10-CM | POA: Diagnosis present

## 2018-07-26 DIAGNOSIS — E785 Hyperlipidemia, unspecified: Secondary | ICD-10-CM | POA: Diagnosis not present

## 2018-07-26 DIAGNOSIS — I8510 Secondary esophageal varices without bleeding: Secondary | ICD-10-CM | POA: Diagnosis present

## 2018-07-26 DIAGNOSIS — R410 Disorientation, unspecified: Secondary | ICD-10-CM | POA: Diagnosis not present

## 2018-07-26 DIAGNOSIS — Z7682 Awaiting organ transplant status: Secondary | ICD-10-CM

## 2018-07-26 DIAGNOSIS — R739 Hyperglycemia, unspecified: Secondary | ICD-10-CM

## 2018-07-26 DIAGNOSIS — K7200 Acute and subacute hepatic failure without coma: Secondary | ICD-10-CM | POA: Diagnosis not present

## 2018-07-26 DIAGNOSIS — E7800 Pure hypercholesterolemia, unspecified: Secondary | ICD-10-CM | POA: Diagnosis not present

## 2018-07-26 DIAGNOSIS — K7682 Hepatic encephalopathy: Secondary | ICD-10-CM

## 2018-07-26 DIAGNOSIS — I2510 Atherosclerotic heart disease of native coronary artery without angina pectoris: Secondary | ICD-10-CM | POA: Diagnosis not present

## 2018-07-26 DIAGNOSIS — Z8249 Family history of ischemic heart disease and other diseases of the circulatory system: Secondary | ICD-10-CM

## 2018-07-26 DIAGNOSIS — Z9119 Patient's noncompliance with other medical treatment and regimen: Secondary | ICD-10-CM

## 2018-07-26 DIAGNOSIS — Z79890 Hormone replacement therapy (postmenopausal): Secondary | ICD-10-CM

## 2018-07-26 DIAGNOSIS — Z888 Allergy status to other drugs, medicaments and biological substances status: Secondary | ICD-10-CM

## 2018-07-26 DIAGNOSIS — Z79899 Other long term (current) drug therapy: Secondary | ICD-10-CM

## 2018-07-26 DIAGNOSIS — Z9079 Acquired absence of other genital organ(s): Secondary | ICD-10-CM

## 2018-07-26 DIAGNOSIS — Z90710 Acquired absence of both cervix and uterus: Secondary | ICD-10-CM

## 2018-07-26 DIAGNOSIS — K7581 Nonalcoholic steatohepatitis (NASH): Secondary | ICD-10-CM | POA: Diagnosis present

## 2018-07-26 DIAGNOSIS — Z9049 Acquired absence of other specified parts of digestive tract: Secondary | ICD-10-CM

## 2018-07-26 LAB — COMPREHENSIVE METABOLIC PANEL
ALT: 31 U/L (ref 0–44)
AST: 37 U/L (ref 15–41)
Albumin: 2.8 g/dL — ABNORMAL LOW (ref 3.5–5.0)
Alkaline Phosphatase: 156 U/L — ABNORMAL HIGH (ref 38–126)
Anion gap: 6 (ref 5–15)
BUN: 11 mg/dL (ref 8–23)
CO2: 22 mmol/L (ref 22–32)
Calcium: 9 mg/dL (ref 8.9–10.3)
Chloride: 109 mmol/L (ref 98–111)
Creatinine, Ser: 0.74 mg/dL (ref 0.44–1.00)
GFR calc Af Amer: >60 mL/min (ref >60)
GFR calc non Af Amer: >60 mL/min (ref >60)
Glucose, Bld: 347 mg/dL — ABNORMAL HIGH (ref 70–99)
Potassium: 3.6 mmol/L (ref 3.5–5.1)
Sodium: 137 mmol/L (ref 135–145)
Total Bilirubin: 2.9 mg/dL — ABNORMAL HIGH (ref 0.3–1.2)
Total Protein: 6.6 g/dL (ref 6.5–8.1)

## 2018-07-26 LAB — CBC WITH DIFFERENTIAL/PLATELET
Abs Immature Granulocytes: 0 10*3/uL (ref 0.00–0.07)
Basophils Absolute: 0.1 10*3/uL (ref 0.0–0.1)
Basophils Relative: 1 %
Eosinophils Absolute: 0.1 10*3/uL (ref 0.0–0.5)
Eosinophils Relative: 2 %
HCT: 31.5 % — ABNORMAL LOW (ref 36.0–46.0)
Hemoglobin: 9.4 g/dL — ABNORMAL LOW (ref 12.0–15.0)
Immature Granulocytes: 0 %
Lymphocytes Relative: 22 %
Lymphs Abs: 0.8 10*3/uL (ref 0.7–4.0)
MCH: 26.6 pg (ref 26.0–34.0)
MCHC: 29.8 g/dL — ABNORMAL LOW (ref 30.0–36.0)
MCV: 89.2 fL (ref 80.0–100.0)
Monocytes Absolute: 0.4 10*3/uL (ref 0.1–1.0)
Monocytes Relative: 12 %
Neutro Abs: 2.3 10*3/uL (ref 1.7–7.7)
Neutrophils Relative %: 63 %
Platelets: 45 10*3/uL — ABNORMAL LOW (ref 150–400)
RBC: 3.53 MIL/uL — ABNORMAL LOW (ref 3.87–5.11)
RDW: 23.5 % — ABNORMAL HIGH (ref 11.5–15.5)
WBC: 3.7 10*3/uL — ABNORMAL LOW (ref 4.0–10.5)
nRBC: 0 % (ref 0.0–0.2)

## 2018-07-26 LAB — CBG MONITORING, ED: Glucose-Capillary: 290 mg/dL — ABNORMAL HIGH (ref 70–99)

## 2018-07-26 LAB — AMMONIA: Ammonia: 135 umol/L — ABNORMAL HIGH (ref 9–35)

## 2018-07-26 MED ORDER — ONDANSETRON HCL 4 MG/2ML IJ SOLN: 4.0000 mg | Freq: Four times a day (QID) | INTRAMUSCULAR | Status: DC | PRN

## 2018-07-26 MED ORDER — LACTULOSE 10 GM/15ML PO SOLN
30.0000 g | Freq: Once | ORAL | Status: AC
  Administered 2018-07-26: 30 g via ORAL
  Filled 2018-07-26: qty 60

## 2018-07-26 MED ORDER — ONDANSETRON HCL 4 MG PO TABS: 4.0000 mg | ORAL_TABLET | Freq: Four times a day (QID) | ORAL | Status: DC | PRN

## 2018-07-26 NOTE — ED Provider Notes (Signed)
[facility] EMERGENCY DEPARTMENT Provider Note   CSN: [id number] Arrival date & time: 07/26/18  2207     History   Chief Complaint Chief Complaint  Patient presents with  . Altered Mental Status    HPI Maria [name] is a 62 y.o. female with a history of CHF, nonalcoholic cirrhosis with recent TIPS procedures performed, esophageal varices with banding procedures, received 3 units of blood several weeks ago secondary to GI bleed, diabetes, hypertension presenting with her husband this evening for increased confusion which he noticed this evening.  They were at a local high school play and by the end of the performance he and their daughter noted that she was slow to respond to questions, less talkative than normal and was noted to be forgetful, consistent with prior episodes of increased ammonia levels.  Husband drove her home, checked her blood sugar and it was around 220.  He gave her her diabetes supplies, but she was confused about drawing up the dose and injecting so did not receive this before arrival. There is been no noted dysarthria or focal weakness.  She has had no other recent illnesses, fevers, chills abdominal pain.  She does endorse having nausea on the way here and vomited prior to arrival with nausea now resolved.  GI is Dr. Laural Golden.  Patient is also being followed by [facility] on their liver transplant list.   The history is provided by the patient.    Past Medical History:  Diagnosis Date  . Anemia   . CHF (congestive heart failure) ([place])   . Cirrhosis ([place])   . Depression   . Diabetes mellitus   . Esophageal bleed, non-variceal   . Eye hemorrhage    Behind left eye  . Fibromyalgia   . Hypercholesteremia   . Hypertension   . Hypothyroidism   . NAFLD (nonalcoholic fatty liver disease)   . Osteoarthrosis   . Osteoporosis   . PONV (postoperative nausea and vomiting)   . UTI (urinary tract infection) 11/12 end of the month   Patient feels that she passed a kidney  stone at that time    Patient Active Problem List   Diagnosis Date Noted  . Obesity, Class III, BMI 40-49.9 (morbid obesity) ([place])   . Melena   . Anemia 07/02/2018  . UGI bleed 11/09/2017  . Insulin dependent diabetes mellitus ([place])   . Absolute anemia 09/13/2017  . Idiopathic esophageal varices with bleeding ([place]) 09/13/2017  . Other cirrhosis of liver ([place]) 08/22/2017  . NAFLD (nonalcoholic fatty liver disease) 08/14/2017  . Encephalopathy, hepatic ([place]) 08/14/2017  . Metabolic encephalopathy 56/21/3086  . Hyperammonemia ([place]) 07/18/2017  . Hypothyroidism 07/18/2017  . Acute on chronic right-sided congestive heart failure ([place]) 07/06/2017  . Pulmonary hypertension ([place]) 07/06/2017  . GAVE (gastric antral vascular ectasia) 07/06/2017  . GI bleeding 07/06/2017  . Symptomatic anemia 07/04/2017  . Hypokalemia 07/04/2017  . Chronic diastolic heart failure ([place]) 06/11/2017  . CAD (coronary artery disease) 06/11/2017  . Nocturnal hypoxemia 02/22/2017  . Idiopathic esophageal varices without bleeding ([place]) 01/31/2017  . Dyspnea and respiratory abnormalities 01/25/2017  . Esophageal varices in cirrhosis ([place]) 05/19/2016  . Hepatic cirrhosis ([place]) 05/19/2016  . Esophageal varices ([place]) 12/09/2012  . Dysphagia, unspecified(787.20) 12/09/2012  . Anxiety 08/26/2012  . Cirrhosis, nonalcoholic ([place]) 57/84/6962  . Diabetes mellitus ([place]) 10/09/2011  . History of esophageal varices 10/09/2011  . Iron deficiency anemia 10/09/2011  . GERD (gastroesophageal reflux disease) 10/09/2011  . Thrombocytopenia ([place]) 10/09/2011  Past Surgical History:  Procedure Laterality Date  . APPENDECTOMY  1980  . BILATERAL SALPINGOOPHORECTOMY    . CHOLECYSTECTOMY    . COLONOSCOPY  03/15/2011  . COLONOSCOPY N/A 03/24/2016   Procedure: COLONOSCOPY;  Surgeon: [name], MD;  [facility];  Service: Endoscopy;  Laterality: N/A;  855  . ESOPHAGEAL BANDING  04/24/2012   Procedure: ESOPHAGEAL  BANDING;  Surgeon: [name], MD;  Location: [facility];  Service: Endoscopy;  Laterality: N/A;  . Esophageal BANDING  08/03/2012   [facility] in [place] , [place]  09/24/2012   Procedure: ESOPHAGEAL BANDING;  Surgeon: [name], MD;  Location: [facility] ORS;  Service: Endoscopy;  Laterality: N/A;  Banding x 3  . ESOPHAGEAL BANDING N/A 01/29/2013   Procedure: ESOPHAGEAL BANDING;  Surgeon: [name], MD;  [facility];  Service: Endoscopy;  Laterality: N/A;  . ESOPHAGEAL BANDING N/A 06/19/2014   Procedure: ESOPHAGEAL BANDING;  Surgeon: [name], MD;  [facility];  Service: Endoscopy;  Laterality: N/A;  . ESOPHAGEAL BANDING N/A 01/05/2016   Procedure: ESOPHAGEAL BANDING;  Surgeon: [name], MD;  [facility];  Service: Endoscopy;  Laterality: N/A;  . ESOPHAGEAL BANDING N/A 08/03/2016   Procedure: ESOPHAGEAL BANDING;  Surgeon: [name], MD;  [facility];  Service: Endoscopy;  Laterality: N/A;  . ESOPHAGEAL BANDING N/A 05/02/2017   Procedure: ESOPHAGEAL BANDING;  Surgeon: [name], MD;  [facility];  Service: Endoscopy;  Laterality: N/A;  . ESOPHAGOGASTRODUODENOSCOPY  04/24/2012   Procedure: ESOPHAGOGASTRODUODENOSCOPY (EGD);  Surgeon: [name], MD;  [facility];  Service: Endoscopy;  Laterality: N/A;  300  . ESOPHAGOGASTRODUODENOSCOPY N/A 01/29/2013   Procedure: ESOPHAGOGASTRODUODENOSCOPY (EGD);  Surgeon: [name], MD;  [facility];  Service: Endoscopy;  Laterality: N/A;  1200  . ESOPHAGOGASTRODUODENOSCOPY N/A 05/22/2013   Procedure: ESOPHAGOGASTRODUODENOSCOPY (EGD);  Surgeon: [name], MD;  [facility];  Service: Endoscopy;  Laterality: N/A;  1:55  . ESOPHAGOGASTRODUODENOSCOPY N/A 12/31/2013   Procedure: ESOPHAGOGASTRODUODENOSCOPY (EGD);  Surgeon: [name], MD;  [facility];  Service: Endoscopy;   Laterality: N/A;  1200  . ESOPHAGOGASTRODUODENOSCOPY N/A 06/19/2014   Procedure: ESOPHAGOGASTRODUODENOSCOPY (EGD);  Surgeon: [name], MD;  [facility];  Service: Endoscopy;  Laterality: N/A;  1055  . ESOPHAGOGASTRODUODENOSCOPY N/A 01/15/2015   Procedure: ESOPHAGOGASTRODUODENOSCOPY (EGD);  Surgeon: [name], MD;  [facility];  Service: Endoscopy;  Laterality: N/A;  730 - moved to 9:45 - moved to 1250-Ann notified pt  . ESOPHAGOGASTRODUODENOSCOPY N/A 06/30/2015   Procedure: ESOPHAGOGASTRODUODENOSCOPY (EGD);  Surgeon: [name], MD;  [facility];  Service: Endoscopy;  Laterality: N/A;  1200  . ESOPHAGOGASTRODUODENOSCOPY N/A 01/05/2016   Procedure: ESOPHAGOGASTRODUODENOSCOPY (EGD);  Surgeon: [name], MD;  [facility];  Service: Endoscopy;  Laterality: N/A;  830  . ESOPHAGOGASTRODUODENOSCOPY N/A 08/03/2016   Procedure: ESOPHAGOGASTRODUODENOSCOPY (EGD);  Surgeon: [name], MD;  [facility];  Service: Endoscopy;  Laterality: N/A;  930  . ESOPHAGOGASTRODUODENOSCOPY N/A 05/02/2017   Procedure: ESOPHAGOGASTRODUODENOSCOPY (EGD);  Surgeon: [name], MD;  [facility];  Service: Endoscopy;  Laterality: N/A;  12:00  . ESOPHAGOGASTRODUODENOSCOPY N/A 08/17/2017   Procedure: ESOPHAGOGASTRODUODENOSCOPY (EGD);  Surgeon: [name], MD;  [facility];  Service: Endoscopy;  Laterality: N/A;  8:15  . ESOPHAGOGASTRODUODENOSCOPY N/A 09/12/2017   Procedure: ESOPHAGOGASTRODUODENOSCOPY (EGD);  Surgeon: [name], MD;  [facility];  Service: Endoscopy;  Laterality: N/A;  1040  . ESOPHAGOGASTRODUODENOSCOPY N/A 10/10/2017   Procedure: ESOPHAGOGASTRODUODENOSCOPY (EGD);  Surgeon: [name], MD;  [facility];  Service: Endoscopy;  Laterality: N/A;  225  . ESOPHAGOGASTRODUODENOSCOPY N/A 11/09/2017   Procedure: ESOPHAGOGASTRODUODENOSCOPY (EGD);  Surgeon: [name], MD;  [facility];  Service: Endoscopy;  Laterality: N/A;  . ESOPHAGOGASTRODUODENOSCOPY (EGD) WITH PROPOFOL  09/24/2012   Procedure: ESOPHAGOGASTRODUODENOSCOPY (EGD) WITH PROPOFOL;  Surgeon: [name], MD;  Location: [facility];  Service: Endoscopy;  Laterality: N/A;  GE junction at 36  . ESOPHAGOGASTRODUODENOSCOPY (EGD) WITH PROPOFOL N/A 07/06/2017   Procedure: ESOPHAGOGASTRODUODENOSCOPY (EGD) WITH PROPOFOL;  Surgeon: [name], MD;  [facility];  Service: Endoscopy;  Laterality: N/A;  . ESOPHAGOGASTRODUODENOSCOPY W/ BANDING  08/2010  . GIVENS CAPSULE STUDY N/A 07/05/2017   Procedure: GIVENS CAPSULE STUDY;  Surgeon: [name], MD;  [facility];  Service: Endoscopy;  Laterality: N/A;  . HOT HEMOSTASIS N/A 08/17/2017   Procedure: HOT HEMOSTASIS (ARGON PLASMA COAGULATION/BICAP);  Surgeon: [name], MD;  [facility];  Service: Endoscopy;  Laterality: N/A;  . HOT HEMOSTASIS  09/12/2017   Procedure: HOT HEMOSTASIS (ARGON PLASMA COAGULATION/BICAP);  Surgeon: [name], MD;  [facility];  Service: Endoscopy;;  gastric  . HOT HEMOSTASIS  10/10/2017   Procedure: HOT HEMOSTASIS (ARGON PLASMA COAGULATION/BICAP);  Surgeon: [name], MD;  [facility];  Service: Endoscopy;;  . POLYPECTOMY  03/24/2016   Procedure: POLYPECTOMY;  Surgeon: [name], MD;  [facility];  Service: Endoscopy;;  sigmoid polyp  . RIGHT HEART CATH N/A 04/16/2017   Procedure: Right Heart Cath;  Surgeon: [name], MD;  Location: [facility];  Service: Cardiovascular;  Laterality: N/A;  . RIGHT HEART CATH N/A 07/12/2017   Procedure: RIGHT HEART CATH;  Surgeon: [name], MD;  Location: [facility];  Service: Cardiovascular;  Laterality: N/A;  . TONSILLECTOMY    . UPPER GASTROINTESTINAL ENDOSCOPY  03/15/2011   EGD ED BANDING/TCS  . UPPER GASTROINTESTINAL ENDOSCOPY  09/05/2010  . UPPER  GASTROINTESTINAL ENDOSCOPY  08/11/2010  . VAGINAL HYSTERECTOMY       OB History   None      Home Medications    Prior to Admission medications   Medication Sig Start Date End Date Taking? Authorizing Provider  Amino Acids-Protein Hydrolys (FEEDING SUPPLEMENT, PRO-STAT SUGAR FREE 64,) LIQD Take 30 mLs by mouth 2 (two) times daily. Patient not taking: Reported on 07/24/2018 07/07/18   Barton Dubois, MD  busPIRone (BUSPAR) 5 MG tablet Take 5 mg by mouth 2 (two) times daily.    [provider]  cholecalciferol (VITAMIN D) 1000 UNITS tablet Take 1,000 Units by mouth 3 (three) times a week.     [provider]  HUMALOG 100 UNIT/ML injection Inject 3-12 Units into the skin 3 (three) times daily with meals.  07/01/18   [provider]  Insulin Glargine (LANTUS SOLOSTAR) 100 UNIT/ML Solostar Pen Inject 38 Units daily at 10 pm into the skin. Patient taking differently: Inject 35 Units into the skin every morning.  07/22/17   [name], [name], MD  Insulin Pen Needle 31G X 5 MM MISC Use with each insulin injection 07/22/17   [name],  [name], MD  lactulose (CHRONULAC) 10 GM/15ML solution TAKE 30 MLS BY MOUTH FOUR TIMES DAILY Patient taking differently: Take 20 g by mouth daily as needed for mild constipation or moderate constipation.  01/07/18   Rehman, Mechele Dawley, MD  levothyroxine (SYNTHROID, LEVOTHROID) 25 MCG tablet Take 25 mcg by mouth daily before breakfast.     [provider]  magnesium oxide (MAG-OX) 400 MG tablet Take 400 mg by mouth daily.     [provider]  multivitamin (VIT W/EXTRA C) CHEW chewable tablet Chew 1 tablet by mouth daily.    [provider]  OXYGEN Inhale 2 L into the lungs daily as needed (FOR BREATHING).     [provider]  pantoprazole (PROTONIX) 40 MG tablet Take 1 tablet (40 mg total) by mouth 2 (two) times daily. Patient taking differently: Take 40 mg by mouth daily.  07/07/18   Barton Dubois, MD  PARoxetine (PAXIL) 40 MG tablet Take 40 mg by mouth every morning.    [provider]  potassium chloride (K-DUR) 10 MEQ tablet Take 10 mEq by mouth 2 (two) times daily.  03/26/18   [provider]  rosuvastatin (CRESTOR) 5 MG tablet Take 1 tablet (5 mg total) by mouth every other day. 06/27/17 10/09/18  [name], MD  spironolactone (ALDACTONE) 50 MG tablet Take 0.5 tablets (25 mg total) by mouth 2 (two) times daily. Patient taking differently: Take 25 mg by mouth daily.  07/07/18   Barton Dubois, MD  torsemide (DEMADEX) 20 MG tablet Take 1 tablet (20 mg total) by mouth daily. 12/18/17   [name], MD  trimethoprim-polymyxin b Mayra Neer) ophthalmic solution Place 1 drop into the left eye See admin instructions. Use 3 to 4 times daily for 2 days following monthly eye injection 05/06/18   [provider]    Family History Family History  Problem Relation Age of Onset  . Lung cancer Mother   . Diabetes Father   . Diabetes Sister   . Hypertension Sister   . Hypothyroidism Sister   . Colon cancer Brother   . Diabetes Sister   . Hypothyroidism Brother   . Healthy Daughter   . Obesity Daughter   . Hypertension Daughter     Social History Social History   Tobacco Use  . Smoking status: Never Smoker  . Smokeless tobacco: Never Used  Substance Use Topics  . Alcohol use: No    Alcohol/week: 0.0 standard drinks  . Drug use: No     Allergies   Ferumoxytol and Tramadol hcl   Review of Systems Review of Systems  Constitutional: Negative for fever.  HENT: Negative for congestion and sore throat.   Eyes: Negative.   Respiratory: Negative for chest tightness and shortness of breath.   Cardiovascular: Negative for chest pain.  Gastrointestinal: Positive for nausea and vomiting. Negative for abdominal pain.  Genitourinary: Negative.   Musculoskeletal: Negative for arthralgias, joint swelling and neck pain.  Skin: Negative.  Negative  for rash and wound.  Neurological: Negative for dizziness, seizures, speech difficulty, weakness, light-headedness, numbness and headaches.  Psychiatric/Behavioral: Positive for confusion. Negative for hallucinations.     Physical Exam Updated Vital Signs BP (!) 153/63 (BP Location: Left Arm)   Pulse 96   Temp 98.2 F (36.8 C) (Oral)   Resp (!) 23   Ht 5' 2"  (1.575 m)   Wt 101.8 kg   SpO2 93%   BMI 41.05 kg/m   Physical Exam  Constitutional:  She appears well-developed and well-nourished.  Pale appearing. Mild jaundice.  HENT:  Head: Normocephalic and atraumatic.  Eyes: Conjunctivae are normal.  Neck: Normal range of motion.  Cardiovascular: Normal rate, regular rhythm, normal heart sounds and intact distal pulses.  Pulmonary/Chest: Effort normal and breath sounds normal. She has no wheezes.  Abdominal: Soft. Bowel sounds are normal. She exhibits no mass. There is no tenderness. There is no guarding.  Musculoskeletal: Normal range of motion.  Neurological: She is alert. She displays no tremor. No cranial nerve deficit or sensory deficit. She exhibits normal muscle tone. Coordination normal.  Responds slowly. Alert to person and place, confused to date, day, year.  Equal grip strength. Moving all 4 extremities without neglect.    Skin: Skin is warm and dry.  Psychiatric: She has a normal mood and affect.  Nursing note and vitals reviewed.    ED Treatments / Results  Labs (all labs ordered are listed, but only abnormal results are displayed) Labs Reviewed  CBC WITH DIFFERENTIAL/PLATELET - Abnormal; Notable for the following components:      Result Value   WBC 3.7 (*)    RBC 3.53 (*)    Hemoglobin 9.4 (*)    HCT 31.5 (*)    MCHC 29.8 (*)    RDW 23.5 (*)    Platelets 45 (*)    All other components within normal limits  COMPREHENSIVE METABOLIC PANEL - Abnormal; Notable for the following components:   Glucose, Bld 347 (*)    Albumin 2.8 (*)    Alkaline Phosphatase 156  (*)    Total Bilirubin 2.9 (*)    All other components within normal limits  AMMONIA - Abnormal; Notable for the following components:   Ammonia 135 (*)    All other components within normal limits  CBG MONITORING, ED - Abnormal; Notable for the following components:   Glucose-Capillary 290 (*)    All other components within normal limits    EKG EKG Interpretation  Date/Time:  Friday July 26 2018 22:19:33 EST Ventricular Rate:  94 PR Interval:    QRS Duration: 102 QT Interval:  369 QTC Calculation: 462 R Axis:   -71 Text Interpretation:  Sinus rhythm Prolonged PR interval Left anterior fascicular block Low voltage, precordial leads No significant change since last tracing Baseline wander in lead(s) V1 Abnormal ekg Confirmed by Carmin Muskrat [phone]) on 07/26/2018 10:26:08 PM   Radiology No results found.  Procedures Procedures (including critical care time)  Medications Ordered in ED Medications  lactulose (CHRONULAC) 10 GM/15ML solution 30 g (has no administration in time range)     Initial Impression / Assessment and Plan / ED Course  I have reviewed the triage vital signs and the nursing notes.  Pertinent labs & imaging results that were available during my care of the patient were reviewed by me and considered in my medical decision making (see chart for details).     Pt with acute hepatic encephalopathy. She reports taking her lactulose as prescribed, but denies taking today. Also with hyperglycemia but no anion gap.  She has not had her evening insulin prior to arrival given confusion at home described above.  Pt will need admission given confusion and elevated ammonia. Lactulose started in ed.  Discussed with Dr. Olevia Bowens who will admit pt.   Final Clinical Impressions(s) / ED Diagnoses   Final diagnoses:  Disorientation  Acute hepatic encephalopathy  Hyperglycemia    ED Discharge Orders    None  Evalee Jefferson, PA-C 07/26/18 2357    Carmin Muskrat, MD 07/30/18 [id number]

## 2018-07-26 NOTE — ED Triage Notes (Signed)
Pt presents to er with confusion that started this evening per spouse, pt is able to state where she is at, slow to answer birthday and day of the week, spouse reports that pt has been like this before and it was related to her ammonia level being high,

## 2018-07-27 DIAGNOSIS — K7290 Hepatic failure, unspecified without coma: Secondary | ICD-10-CM | POA: Diagnosis not present

## 2018-07-27 LAB — CBC WITH DIFFERENTIAL/PLATELET
Abs Immature Granulocytes: 0 10*3/uL (ref 0.00–0.07)
Basophils Absolute: 0 10*3/uL (ref 0.0–0.1)
Basophils Relative: 1 %
Eosinophils Absolute: 0.1 10*3/uL (ref 0.0–0.5)
Eosinophils Relative: 2 %
HCT: 27.4 % — ABNORMAL LOW (ref 36.0–46.0)
Hemoglobin: 8.2 g/dL — ABNORMAL LOW (ref 12.0–15.0)
Immature Granulocytes: 0 %
Lymphocytes Relative: 28 %
Lymphs Abs: 0.8 10*3/uL (ref 0.7–4.0)
MCH: 27.3 pg (ref 26.0–34.0)
MCHC: 29.9 g/dL — ABNORMAL LOW (ref 30.0–36.0)
MCV: 91.3 fL (ref 80.0–100.0)
Monocytes Absolute: 0.3 10*3/uL (ref 0.1–1.0)
Monocytes Relative: 10 %
Neutro Abs: 1.7 10*3/uL (ref 1.7–7.7)
Neutrophils Relative %: 59 %
Platelets: 43 10*3/uL — ABNORMAL LOW (ref 150–400)
RBC: 3 MIL/uL — ABNORMAL LOW (ref 3.87–5.11)
RDW: 23.3 % — ABNORMAL HIGH (ref 11.5–15.5)
WBC: 3 10*3/uL — ABNORMAL LOW (ref 4.0–10.5)
nRBC: 0 % (ref 0.0–0.2)

## 2018-07-27 LAB — CBG MONITORING, ED: Glucose-Capillary: 287 mg/dL — ABNORMAL HIGH (ref 70–99)

## 2018-07-27 LAB — MRSA PCR SCREENING: MRSA by PCR: NEGATIVE

## 2018-07-27 LAB — COMPREHENSIVE METABOLIC PANEL
ALT: 27 U/L (ref 0–44)
AST: 29 U/L (ref 15–41)
Albumin: 2.4 g/dL — ABNORMAL LOW (ref 3.5–5.0)
Alkaline Phosphatase: 128 U/L — ABNORMAL HIGH (ref 38–126)
Anion gap: 7 (ref 5–15)
BUN: 11 mg/dL (ref 8–23)
CO2: 22 mmol/L (ref 22–32)
Calcium: 9 mg/dL (ref 8.9–10.3)
Chloride: 111 mmol/L (ref 98–111)
Creatinine, Ser: 0.65 mg/dL (ref 0.44–1.00)
GFR calc Af Amer: >60 mL/min (ref >60)
GFR calc non Af Amer: >60 mL/min (ref >60)
Glucose, Bld: 286 mg/dL — ABNORMAL HIGH (ref 70–99)
Potassium: 3.4 mmol/L — ABNORMAL LOW (ref 3.5–5.1)
Sodium: 140 mmol/L (ref 135–145)
Total Bilirubin: 2.8 mg/dL — ABNORMAL HIGH (ref 0.3–1.2)
Total Protein: 5.6 g/dL — ABNORMAL LOW (ref 6.5–8.1)

## 2018-07-27 LAB — PROTIME-INR
INR: 1.53
Prothrombin Time: 18.2 seconds — ABNORMAL HIGH (ref 11.4–15.2)

## 2018-07-27 LAB — TSH: TSH: 1.659 u[IU]/mL (ref 0.350–4.500)

## 2018-07-27 LAB — AMMONIA: Ammonia: 122 umol/L — ABNORMAL HIGH (ref 9–35)

## 2018-07-27 LAB — GLUCOSE, CAPILLARY
Glucose-Capillary: 231 mg/dL — ABNORMAL HIGH (ref 70–99)
Glucose-Capillary: 255 mg/dL — ABNORMAL HIGH (ref 70–99)
Glucose-Capillary: 335 mg/dL — ABNORMAL HIGH (ref 70–99)
Glucose-Capillary: 294 mg/dL — ABNORMAL HIGH (ref 70–99)

## 2018-07-27 MED ORDER — SPIRONOLACTONE 25 MG PO TABS
25.0000 mg | ORAL_TABLET | Freq: Every day | ORAL | Status: DC
  Administered 2018-07-27 – 2018-07-31 (×5): 25 mg via ORAL
  Filled 2018-07-27 (×5): qty 1

## 2018-07-27 MED ORDER — INSULIN ASPART 100 UNIT/ML ~~LOC~~ SOLN
0.0000 [IU] | Freq: Three times a day (TID) | SUBCUTANEOUS | Status: DC
  Administered 2018-07-27: 3 [IU] via SUBCUTANEOUS
  Administered 2018-07-27: 7 [IU] via SUBCUTANEOUS
  Administered 2018-07-27: 5 [IU] via SUBCUTANEOUS
  Administered 2018-07-28: 7 [IU] via SUBCUTANEOUS
  Administered 2018-07-28: 9 [IU] via SUBCUTANEOUS
  Administered 2018-07-28: 3 [IU] via SUBCUTANEOUS
  Administered 2018-07-29 (×2): 7 [IU] via SUBCUTANEOUS
  Administered 2018-07-29: 3 [IU] via SUBCUTANEOUS
  Administered 2018-07-30: 9 [IU] via SUBCUTANEOUS
  Administered 2018-07-30: 2 [IU] via SUBCUTANEOUS
  Administered 2018-07-30: 7 [IU] via SUBCUTANEOUS
  Administered 2018-07-31 (×2): 5 [IU] via SUBCUTANEOUS
  Administered 2018-07-31: 7 [IU] via SUBCUTANEOUS

## 2018-07-27 MED ORDER — BUSPIRONE HCL 5 MG PO TABS
5.0000 mg | ORAL_TABLET | Freq: Two times a day (BID) | ORAL | Status: DC
  Administered 2018-07-27 – 2018-07-31 (×9): 5 mg via ORAL
  Filled 2018-07-27 (×9): qty 1

## 2018-07-27 MED ORDER — PAROXETINE HCL 20 MG PO TABS
40.0000 mg | ORAL_TABLET | ORAL | Status: DC
  Administered 2018-07-27 – 2018-07-31 (×5): 40 mg via ORAL
  Filled 2018-07-27 (×5): qty 2

## 2018-07-27 MED ORDER — CALCIUM CARBONATE ANTACID 500 MG PO CHEW
1.0000 | CHEWABLE_TABLET | Freq: Once | ORAL | Status: AC
  Administered 2018-07-27: 200 mg via ORAL
  Filled 2018-07-27: qty 1

## 2018-07-27 MED ORDER — PANTOPRAZOLE SODIUM 40 MG PO TBEC
40.0000 mg | DELAYED_RELEASE_TABLET | Freq: Every day | ORAL | Status: DC
  Administered 2018-07-27 – 2018-07-31 (×5): 40 mg via ORAL
  Filled 2018-07-27 (×5): qty 1

## 2018-07-27 MED ORDER — TORSEMIDE 20 MG PO TABS
20.0000 mg | ORAL_TABLET | Freq: Every day | ORAL | Status: DC
  Administered 2018-07-27 – 2018-07-29 (×3): 20 mg via ORAL
  Filled 2018-07-27 (×3): qty 1

## 2018-07-27 MED ORDER — INSULIN GLARGINE 100 UNIT/ML ~~LOC~~ SOLN
35.0000 [IU] | Freq: Every morning | SUBCUTANEOUS | Status: DC
  Administered 2018-07-27 – 2018-07-30 (×4): 35 [IU] via SUBCUTANEOUS
  Filled 2018-07-27 (×7): qty 0.35

## 2018-07-27 MED ORDER — LEVOTHYROXINE SODIUM 25 MCG PO TABS
25.0000 ug | ORAL_TABLET | Freq: Every day | ORAL | Status: DC
  Administered 2018-07-28 – 2018-07-31 (×4): 25 ug via ORAL
  Filled 2018-07-27 (×4): qty 1

## 2018-07-27 MED ORDER — LACTULOSE 10 GM/15ML PO SOLN
30.0000 g | Freq: Three times a day (TID) | ORAL | Status: DC
  Administered 2018-07-27 – 2018-07-31 (×14): 30 g via ORAL
  Filled 2018-07-27 (×14): qty 60

## 2018-07-27 MED ORDER — HEPARIN SODIUM (PORCINE) 5000 UNIT/ML IJ SOLN
5000.0000 [IU] | Freq: Three times a day (TID) | INTRAMUSCULAR | Status: DC
  Administered 2018-07-27 – 2018-07-28 (×3): 5000 [IU] via SUBCUTANEOUS
  Filled 2018-07-27 (×3): qty 1

## 2018-07-27 MED ORDER — ROSUVASTATIN CALCIUM 10 MG PO TABS
5.0000 mg | ORAL_TABLET | ORAL | Status: DC
  Administered 2018-07-28 – 2018-07-30 (×2): 5 mg via ORAL
  Filled 2018-07-27 (×4): qty 1

## 2018-07-27 MED ORDER — MAGNESIUM OXIDE 400 (241.3 MG) MG PO TABS
400.0000 mg | ORAL_TABLET | Freq: Every day | ORAL | Status: DC
  Administered 2018-07-27 – 2018-07-31 (×5): 400 mg via ORAL
  Filled 2018-07-27 (×5): qty 1

## 2018-07-27 NOTE — Progress Notes (Signed)
PROGRESS NOTE    Maria [name]  QIW:[id number] DOB: February 20, 1956 DOA: 07/26/2018 PCP: Maria Chroman, MD   Brief Narrative: ) 62 year old with past medical history relevant for Maria [name] cirrhosis with GAVE/portal hypertensive gastropathy status post argon laser ablation, type 2 diabetes on insulin, upper lipidemia, hypothyroidism, depression/anxiety, hyperlipidemia who presents with altered mental status in the setting of hyperammonemia concerning for hepatic encephalopathy.   Assessment & Plan:   Principal Problem:   Hepatic encephalopathy (HCC) Active Problems:   Cirrhosis, nonalcoholic (HCC)   GERD (gastroesophageal reflux disease)   Hyperammonemia (HCC)   Hypothyroidism   Anemia   #) Hepatic encephalopathy/[place] cirrhosis: Suspect likely secondary to noncompliance with lactulose.  Patient reports that she has not had any infectious symptoms and has not had any GI bleed. -Continue lactulose with goal of 3-4 bowel movements a day - Restart home spironolactone 25 mg daily -Restart home torsemide 20 mg daily -We will consider starting rifaximin  #) Type 2 diabetes on insulin: -Continue glargine 35 units nightly -Sliding scale insulin, SHS - Carb restricted diet  #) Hypothyroidism: -Continue levothyroxine 25 mcg daily  #) Hyperlipidemia: -Continue rosuvastatin 5 mg daily  #) Pain/psych: -Continue buspirone 5 mg twice daily -Continue paroxetine 40 mg every morning  Fluids: Tolerating p.o. Electrolytes: Monitor and supplement Nutrition: Carb restricted diet  Prophylaxis: Subcu heparin  Disposition: Pending resolution of altered mental status  Full code  Consultants:   None  Procedures:   None  Antimicrobials:  None   Subjective: This morning patient is more awake and lower oriented and alert.  She is quite slowed however.  She does not have any pain anywhere.  She reports that she has been taking her lactulose as prescribed but did miss several doses.  She  denies any fevers, hematemesis, cough, congestion, rhinorrhea.  Objective: Vitals:   07/27/18 0700 07/27/18 0728 07/27/18 0817 07/27/18 0832  BP:   (!) 135/54   Pulse: 91 97 89   Resp: (!) 22 19 14    Temp:  98.4 F (36.9 C)    TempSrc:  Oral    SpO2: 92% 93% 95% 94%  Weight:      Height:       No intake or output data in the 24 hours ending 07/27/18 1024 Filed Weights   07/26/18 2223  Weight: 101.8 kg    Examination:  General exam: Appears calm and comfortable  Respiratory system: Clear to auscultation. Respiratory effort normal. Cardiovascular system: Regular rate and rhythm, no murmurs Gastrointestinal system: Soft, nondistended, no rebound or guarding, plus bowel sounds Central nervous system: Alert and oriented.   Very slowed, moving all extremities, asterixis noted Extremities: Trace lower extremity edema. Skin: No rashes over visible skin Psychiatry: Slowed but intact judgment and insight,    Data Reviewed: I have personally reviewed following labs and imaging studies  CBC: Recent Labs  Lab 07/26/18 2228 07/27/18 0406  WBC 3.7* 3.0*  NEUTROABS 2.3 1.7  HGB 9.4* 8.2*  HCT 31.5* 27.4*  MCV 89.2 91.3  PLT 45* 43*   Basic Metabolic Panel: Recent Labs  Lab 07/26/18 2228 07/27/18 0406  NA 137 140  K 3.6 3.4*  CL 109 111  CO2 22 22  GLUCOSE 347* 286*  BUN 11 11  CREATININE 0.74 0.65  CALCIUM 9.0 9.0   GFR: Estimated Creatinine Clearance: 80.4 mL/min (by C-G formula based on SCr of 0.65 mg/dL). Liver Function Tests: Recent Labs  Lab 07/26/18 2228 07/27/18 0406  AST 37 29  ALT 31  27  ALKPHOS 156* 128*  BILITOT 2.9* 2.8*  PROT 6.6 5.6*  ALBUMIN 2.8* 2.4*   No results for input(s): LIPASE, AMYLASE in the last 168 hours. Recent Labs  Lab 07/26/18 2228 07/27/18 0405  AMMONIA 135* 122*   Coagulation Profile: Recent Labs  Lab 07/27/18 0455  INR 1.53   Cardiac Enzymes: No results for input(s): CKTOTAL, CKMB, CKMBINDEX, TROPONINI in the  last 168 hours. BNP (last 3 results) No results for input(s): PROBNP in the last 8760 hours. HbA1C: No results for input(s): HGBA1C in the last 72 hours. CBG: Recent Labs  Lab 07/26/18 2307 07/27/18 0057 07/27/18 0718  GLUCAP 290* 287* 231*   Lipid Profile: No results for input(s): CHOL, HDL, LDLCALC, TRIG, CHOLHDL, LDLDIRECT in the last 72 hours. Thyroid Function Tests: Recent Labs    07/27/18 0406  TSH 1.659   Anemia Panel: No results for input(s): VITAMINB12, FOLATE, FERRITIN, TIBC, IRON, RETICCTPCT in the last 72 hours. Sepsis Labs: No results for input(s): PROCALCITON, LATICACIDVEN in the last 168 hours.  Recent Results (from the past 240 hour(s))  MRSA PCR Screening     Status: None   Collection Time: 07/27/18  2:37 AM  Result Value Ref Range Status   MRSA by PCR NEGATIVE NEGATIVE Final    Comment:        The GeneXpert MRSA Assay (FDA approved for NASAL specimens only), is one component of a comprehensive MRSA colonization surveillance program. It is not intended to diagnose MRSA infection nor to guide or monitor treatment for MRSA infections. Performed at [facility], [address], [place],  [address]          Radiology Studies: No results found.      Scheduled Meds: . busPIRone  5 mg Oral BID  . insulin aspart  0-9 Units Subcutaneous TID WC  . insulin glargine  35 Units Subcutaneous q morning - 10a  . lactulose  30 g Oral TID   Continuous Infusions:   LOS: 0 days    Time spent: [place], MD [facility]  If 7PM-7AM, please contact night-coverage [email or web address] Password [email or web address] 07/27/2018, 10:24 AM

## 2018-07-27 NOTE — H&P (Addendum)
History and Physical    Maria [name] SNK:[id number] DOB: [birth year] DOA: 07/26/2018  PCP: Glenda Chroman, MD   Patient coming from: Home.  I have personally briefly reviewed patient's old medical records in [place]  Chief Complaint: AMS.  HPI: Maria [name] is a 62 y.o. female with medical history significant of anemia, CHF, cirrhosis, depression, diabetes mellitus, esophageal bleed (non-variceal), fibromyalgia, hypercholesterolemia, hypertension, hypothyroidism, NAFLD (nonalcoholic fatty liver disease), osteoarthrosis, osteoporosis, PONV, UTI who is brought to the emergency department by her spouse due to increased confusion since earlier today, which is typical of her episodes of hyperammonemia.  She had an episode of emesis witnessed by her husband earlier in the evening and her mental status seemed to have improved after that.  She denies fever, chills, dietary indiscretions, cough, chest pain, PND, orthopnea, but she gets occasional pitting edema of the lower extremities.  No abdominal pain, constipation, melena or hematochezia.  Dysuria, frequency or hematuria.  No heat or cold intolerance..  ED Course: BP 153/63 (BP Location: Left Arm)   Pulse 96   Temp 98.2 F (36.8 C) (Oral)   Resp (!) 23   Ht 5' 2"  (1.575 m)   Wt 101.8 kg   SpO2 93%   BMI 41.05 kg/m    White count is 3.7, hemoglobin 9.4 g/dL and platelets 45.  Her CMP showed a glucose of 347 and a total bilirubin of 2.9 mg/dL.  Total protein was 2.8 g/dL and alkaline phosphatase is 156 units/L.  Her ammonia level was 135 mol/L .   Review of Systems: As per HPI otherwise 10 point review of systems negative.   Past Medical History:  Diagnosis Date  . Anemia   . CHF (congestive heart failure) ([place])   . Cirrhosis ([place])   . Depression   . Diabetes mellitus   . Esophageal bleed, non-variceal   . Eye hemorrhage    Behind left eye  . Fibromyalgia   . Hypercholesteremia   . Hypertension   . Hypothyroidism    . NAFLD (nonalcoholic fatty liver disease)   . Osteoarthrosis   . Osteoporosis   . PONV (postoperative nausea and vomiting)   . UTI (urinary tract infection) 11/12 end of the month   Patient feels that she passed a kidney stone at that time    Past Surgical History:  Procedure Laterality Date  . APPENDECTOMY  1980  . BILATERAL SALPINGOOPHORECTOMY    . CHOLECYSTECTOMY    . COLONOSCOPY  03/15/2011  . COLONOSCOPY N/A 03/24/2016   Procedure: COLONOSCOPY;  Surgeon: [name], MD;  [facility];  Service: Endoscopy;  Laterality: N/A;  855  . ESOPHAGEAL BANDING  04/24/2012   Procedure: ESOPHAGEAL BANDING;  Surgeon: [name], MD;  [facility];  Service: Endoscopy;  Laterality: N/A;  . Esophageal BANDING  08/03/2012   [facility] in [place] , [place]  09/24/2012   Procedure: ESOPHAGEAL BANDING;  Surgeon: [name], MD;  Location: [facility];  Service: Endoscopy;  Laterality: N/A;  Banding x 3  . ESOPHAGEAL BANDING N/A 01/29/2013   Procedure: ESOPHAGEAL BANDING;  Surgeon: [name], MD;  [facility];  Service: Endoscopy;  Laterality: N/A;  . ESOPHAGEAL BANDING N/A 06/19/2014   Procedure: ESOPHAGEAL BANDING;  Surgeon: [name], MD;  [facility];  Service: Endoscopy;  Laterality: N/A;  . ESOPHAGEAL BANDING N/A 01/05/2016   Procedure: ESOPHAGEAL BANDING;  Surgeon:  [name], MD;  [facility];  Service: Endoscopy;  Laterality: N/A;  . ESOPHAGEAL BANDING N/A 08/03/2016   Procedure: ESOPHAGEAL BANDING;  Surgeon: [name], MD;  [facility];  Service: Endoscopy;  Laterality: N/A;  . ESOPHAGEAL BANDING N/A 05/02/2017   Procedure: ESOPHAGEAL BANDING;  Surgeon: [name], MD;  [facility];  Service: Endoscopy;  Laterality: N/A;  . ESOPHAGOGASTRODUODENOSCOPY  04/24/2012   Procedure: ESOPHAGOGASTRODUODENOSCOPY (EGD);  Surgeon: [name], MD;   [facility];  Service: Endoscopy;  Laterality: N/A;  300  . ESOPHAGOGASTRODUODENOSCOPY N/A 01/29/2013   Procedure: ESOPHAGOGASTRODUODENOSCOPY (EGD);  Surgeon: [name], MD;  [facility];  Service: Endoscopy;  Laterality: N/A;  1200  . ESOPHAGOGASTRODUODENOSCOPY N/A 05/22/2013   Procedure: ESOPHAGOGASTRODUODENOSCOPY (EGD);  Surgeon: [name], MD;  [facility];  Service: Endoscopy;  Laterality: N/A;  1:55  . ESOPHAGOGASTRODUODENOSCOPY N/A 12/31/2013   Procedure: ESOPHAGOGASTRODUODENOSCOPY (EGD);  Surgeon: [name], MD;  [facility];  Service: Endoscopy;  Laterality: N/A;  1200  . ESOPHAGOGASTRODUODENOSCOPY N/A 06/19/2014   Procedure: ESOPHAGOGASTRODUODENOSCOPY (EGD);  Surgeon: [name], MD;  [facility];  Service: Endoscopy;  Laterality: N/A;  1055  . ESOPHAGOGASTRODUODENOSCOPY N/A 01/15/2015   Procedure: ESOPHAGOGASTRODUODENOSCOPY (EGD);  Surgeon: [name], MD;  [facility];  Service: Endoscopy;  Laterality: N/A;  730 - moved to 9:45 - moved to 1250-Ann notified pt  . ESOPHAGOGASTRODUODENOSCOPY N/A 06/30/2015   Procedure: ESOPHAGOGASTRODUODENOSCOPY (EGD);  Surgeon: [name], MD;  [facility];  Service: Endoscopy;  Laterality: N/A;  1200  . ESOPHAGOGASTRODUODENOSCOPY N/A 01/05/2016   Procedure: ESOPHAGOGASTRODUODENOSCOPY (EGD);  Surgeon: [name], MD;  [facility];  Service: Endoscopy;  Laterality: N/A;  830  . ESOPHAGOGASTRODUODENOSCOPY N/A 08/03/2016   Procedure: ESOPHAGOGASTRODUODENOSCOPY (EGD);  Surgeon: [name], MD;  [facility];  Service: Endoscopy;  Laterality: N/A;  930  . ESOPHAGOGASTRODUODENOSCOPY N/A 05/02/2017   Procedure: ESOPHAGOGASTRODUODENOSCOPY (EGD);  Surgeon: [name], MD;  [facility];  Service: Endoscopy;  Laterality: N/A;  12:00  . ESOPHAGOGASTRODUODENOSCOPY N/A 08/17/2017   Procedure:  ESOPHAGOGASTRODUODENOSCOPY (EGD);  Surgeon: [name], MD;  [facility];  Service: Endoscopy;  Laterality: N/A;  8:15  . ESOPHAGOGASTRODUODENOSCOPY N/A 09/12/2017   Procedure: ESOPHAGOGASTRODUODENOSCOPY (EGD);  Surgeon: [name], MD;  [facility];  Service: Endoscopy;  Laterality: N/A;  1040  . ESOPHAGOGASTRODUODENOSCOPY N/A 10/10/2017   Procedure: ESOPHAGOGASTRODUODENOSCOPY (EGD);  Surgeon: [name], MD;  [facility];  Service: Endoscopy;  Laterality: N/A;  225  . ESOPHAGOGASTRODUODENOSCOPY N/A 11/09/2017   Procedure: ESOPHAGOGASTRODUODENOSCOPY (EGD);  Surgeon: [name], MD;  [facility];  Service: Endoscopy;  Laterality: N/A;  . ESOPHAGOGASTRODUODENOSCOPY (EGD) WITH PROPOFOL  09/24/2012   Procedure: ESOPHAGOGASTRODUODENOSCOPY (EGD) WITH PROPOFOL;  Surgeon: [name], MD;  [facility]: [facility];  Service: Endoscopy;  Laterality: N/A;  GE junction at 36  . ESOPHAGOGASTRODUODENOSCOPY (EGD) WITH PROPOFOL N/A 07/06/2017   Procedure: ESOPHAGOGASTRODUODENOSCOPY (EGD) WITH PROPOFOL;  Surgeon: [name], MD;  [facility];  Service: Endoscopy;  Laterality: N/A;  . ESOPHAGOGASTRODUODENOSCOPY W/ BANDING  08/2010  . GIVENS CAPSULE STUDY N/A 07/05/2017   Procedure: GIVENS CAPSULE STUDY;  Surgeon: [name], MD;  [facility];  Service: Endoscopy;  Laterality: N/A;  . HOT HEMOSTASIS N/A 08/17/2017  Procedure: HOT HEMOSTASIS (ARGON PLASMA COAGULATION/BICAP);  Surgeon: [name], MD;  [facility];  Service: Endoscopy;  Laterality: N/A;  . HOT HEMOSTASIS  09/12/2017   Procedure: HOT HEMOSTASIS (ARGON PLASMA COAGULATION/BICAP);  Surgeon: [name], MD;  [facility];  Service: Endoscopy;;  gastric  . HOT HEMOSTASIS  10/10/2017   Procedure: HOT HEMOSTASIS (ARGON PLASMA COAGULATION/BICAP);  Surgeon: [name], MD;  [facility];  Service: Endoscopy;;    . POLYPECTOMY  03/24/2016   Procedure: POLYPECTOMY;  Surgeon: [name], MD;  [facility];  Service: Endoscopy;;  sigmoid polyp  . RIGHT HEART CATH N/A 04/16/2017   Procedure: Right Heart Cath;  Surgeon: [name], MD;  Location: [place];  Service: Cardiovascular;  Laterality: N/A;  . RIGHT HEART CATH N/A 07/12/2017   Procedure: RIGHT HEART CATH;  Surgeon: [name], MD;  Location: [facility] [facility];  Service: Cardiovascular;  Laterality: N/A;  . TONSILLECTOMY    . UPPER GASTROINTESTINAL ENDOSCOPY  03/15/2011   EGD ED BANDING/TCS  . UPPER GASTROINTESTINAL ENDOSCOPY  09/05/2010  . UPPER GASTROINTESTINAL ENDOSCOPY  08/11/2010  . VAGINAL HYSTERECTOMY       reports that she has never smoked. She has never used smokeless tobacco. She reports that she does not drink alcohol or use drugs.  Allergies  Allergen Reactions  . Ferumoxytol Other (See Comments)    Patient has severe back and chest pain when receiving FERAHEME infusions. Patient has severe back and chest pain when receiving FERAHEME infusions.  . Tramadol Hcl Other (See Comments)    WEAKNESS WEAKNESS    Family History  Problem Relation Age of Onset  . Lung cancer Mother   . Diabetes Father   . Diabetes Sister   . Hypertension Sister   . Hypothyroidism Sister   . Colon cancer Brother   . Diabetes Sister   . Hypothyroidism Brother   . Healthy Daughter   . Obesity Daughter   . Hypertension Daughter    Prior to Admission medications   Medication Sig Start Date End Date Taking? Authorizing Provider  Amino Acids-Protein Hydrolys (FEEDING SUPPLEMENT, PRO-STAT SUGAR FREE 64,) LIQD Take 30 mLs by mouth 2 (two) times daily. Patient not taking: Reported on 07/24/2018 07/07/18   Barton Dubois, MD  busPIRone (BUSPAR) 5 MG tablet Take 5 mg by mouth 2 (two) times daily.    [provider]  cholecalciferol (VITAMIN D) 1000 UNITS tablet Take 1,000 Units by mouth 3 (three) times a week.      [provider]  HUMALOG 100 UNIT/ML injection Inject 3-12 Units into the skin 3 (three) times daily with meals.  07/01/18   [provider]  Insulin Glargine (LANTUS SOLOSTAR) 100 UNIT/ML Solostar Pen Inject 38 Units daily at 10 pm into the skin. Patient taking differently: Inject 35 Units into the skin every morning.  07/22/17   [name], [name], MD  Insulin Pen Needle 31G X 5 MM MISC Use with each insulin injection 07/22/17   [name], [name], MD  lactulose ([place]) 10 GM/15ML solution TAKE 30 MLS BY MOUTH FOUR TIMES DAILY Patient taking differently: Take 20 g by mouth daily as needed for mild constipation or moderate constipation.  01/07/18   Rehman, Mechele Dawley, MD  levothyroxine (SYNTHROID, LEVOTHROID) 25 MCG tablet Take 25 mcg by mouth daily before breakfast.     [provider]  magnesium oxide (MAG-OX) 400 MG tablet Take  400 mg by mouth daily.     [provider]  multivitamin (VIT W/EXTRA C) CHEW chewable tablet Chew 1 tablet by mouth daily.    [provider]  OXYGEN Inhale 2 L into the lungs daily as needed (FOR BREATHING).     [provider]  pantoprazole (PROTONIX) 40 MG tablet Take 1 tablet (40 mg total) by mouth 2 (two) times daily. Patient taking differently: Take 40 mg by mouth daily.  07/07/18   Barton Dubois, MD  PARoxetine (PAXIL) 40 MG tablet Take 40 mg by mouth every morning.    [provider]  potassium chloride (K-DUR) 10 MEQ tablet Take 10 mEq by mouth 2 (two) times daily.  03/26/18   [provider]  rosuvastatin (CRESTOR) 5 MG tablet Take 1 tablet (5 mg total) by mouth every other day. 06/27/17 10/09/18  [name], MD  spironolactone (ALDACTONE) 50 MG tablet Take 0.5 tablets (25 mg total) by mouth 2 (two) times daily. Patient taking differently: Take 25 mg by mouth daily.  07/07/18   Barton Dubois, MD  torsemide (DEMADEX) 20 MG tablet Take 1 tablet (20 mg total) by mouth  daily. 12/18/17   [name], MD  trimethoprim-polymyxin b Mayra Neer) ophthalmic solution Place 1 drop into the left eye See admin instructions. Use 3 to 4 times daily for 2 days following monthly eye injection 05/06/18   [provider]    Physical Exam: Vitals:   07/26/18 2230 07/26/18 2238 07/27/18 0100 07/27/18 0101  BP:  (!) 153/63 (!) 132/92 (!) 132/92  Pulse:  96  93  Resp: 19 (!) 23 (!) 23 20  Temp:  98.2 F (36.8 C)    TempSrc:  Oral    SpO2:  93%  95%  Weight:      Height:        Constitutional: NAD, calm, comfortable Eyes: PERRL, lids and conjunctivae normal.  Positive scleral icterus. ENMT: Mucous membranes are moist. Posterior pharynx clear of any exudate or lesions. Neck: normal, supple, no masses, no thyromegaly Respiratory: clear to auscultation bilaterally, no wheezing, no crackles. Normal respiratory effort. No accessory muscle use.  Cardiovascular: Regular rate and rhythm, no murmurs / rubs / gallops. No extremity edema. 2+ pedal pulses. No carotid bruits.  Abdomen: Soft, no tenderness, no masses palpated. No hepatosplenomegaly. Bowel sounds positive.  Musculoskeletal: no clubbing / cyanosis.  Delete that.  Good ROM, no contractures. Normal muscle tone.  Skin: Positive jaundice. Neurologic: CN 2-12 grossly intact. Sensation intact, DTR normal.  Generalized weakness. Psychiatric: Somnolent.  Alert and oriented x x2, partially oriented to time and situation.  Labs on Admission: I have personally reviewed following labs and imaging studies  CBC: Recent Labs  Lab 07/26/18 2228  WBC 3.7*  NEUTROABS 2.3  HGB 9.4*  HCT 31.5*  MCV 89.2  PLT 45*   Basic Metabolic Panel: Recent Labs  Lab 07/26/18 2228  NA 137  K 3.6  CL 109  CO2 22  GLUCOSE 347*  BUN 11  CREATININE 0.74  CALCIUM 9.0   GFR: Estimated Creatinine Clearance: 80.4 mL/min (by C-G formula based on SCr of 0.74 mg/dL). Liver Function Tests: Recent Labs  Lab 07/26/18 2228  AST  37  ALT 31  ALKPHOS 156*  BILITOT 2.9*  PROT 6.6  ALBUMIN 2.8*   No results for input(s): LIPASE, AMYLASE in the last 168 hours. Recent Labs  Lab 07/26/18 2228  AMMONIA 135*   Coagulation Profile: No results for input(s): INR,  PROTIME in the last 168 hours. Cardiac Enzymes: No results for input(s): CKTOTAL, CKMB, CKMBINDEX, TROPONINI in the last 168 hours. BNP (last 3 results) No results for input(s): PROBNP in the last 8760 hours. HbA1C: No results for input(s): HGBA1C in the last 72 hours. CBG: Recent Labs  Lab 07/26/18 2307 07/27/18 0057  GLUCAP 290* 287*   Lipid Profile: No results for input(s): CHOL, HDL, LDLCALC, TRIG, CHOLHDL, LDLDIRECT in the last 72 hours. Thyroid Function Tests: No results for input(s): TSH, T4TOTAL, FREET4, T3FREE, THYROIDAB in the last 72 hours. Anemia Panel: No results for input(s): VITAMINB12, FOLATE, FERRITIN, TIBC, IRON, RETICCTPCT in the last 72 hours. Urine analysis:    Component Value Date/Time   COLORURINE YELLOW 07/03/2018 0340   APPEARANCEUR CLEAR 07/03/2018 0340   LABSPEC 1.011 07/03/2018 0340   PHURINE 5.0 07/03/2018 0340   GLUCOSEU NEGATIVE 07/03/2018 0340   HGBUR NEGATIVE 07/03/2018 0340   BILIRUBINUR NEGATIVE 07/03/2018 0340   KETONESUR NEGATIVE 07/03/2018 0340   PROTEINUR NEGATIVE 07/03/2018 0340   NITRITE NEGATIVE 07/03/2018 0340   LEUKOCYTESUR NEGATIVE 07/03/2018 0340    Radiological Exams on Admission: No results found.  EKG: Independently reviewed. Vent. rate 94 BPM PR interval * ms QRS duration 102 ms QT/QTc 369/462 ms P-R-T axes 62 -71 65 Sinus rhythm Prolonged PR interval Left anterior fascicular block Low voltage, precordial leads  Assessment/Plan Principal Problem:   Hepatic encephalopathy (HCC)   Hyperammonemia (HCC) H&H has increased. There has not being any dietary indiscretions. Observation/telemetry. Continue supplemental oxygen. Continue lactulose. Follow-up ammonia level. Staff to  provide further cirrhosis diet information.  Active Problems:   Cirrhosis, nonalcoholic (HCC) MELD score was 15 with a mortality rate of 6% 90 days. Child Pugh score of 9. Continue treatment as above. Otherwise, no other signs of decompensation. Resume torsemide and spironolactone in a.m. once dose is confirmed.    GERD (gastroesophageal reflux disease) Resume pantoprazole 40 mg p.o. daily.    Hypothyroidism Resume levothyroxine once med reconciliation performed Check TSH.    Anemia Improved from baseline. Monitor H&H.    DVT prophylaxis: SCDs. Code Status: Full code. Family Communication: Peristalsis present in the ED. Disposition Plan: Delete. Consults called: Admission status: Observation/telemetry.   Reubin Milan MD [facility] Pager [phone].  If 7PM-7AM, please contact night-coverage [email or web address] Password [email or web address]  07/27/2018, 12:03 AM

## 2018-07-28 DIAGNOSIS — K7290 Hepatic failure, unspecified without coma: Secondary | ICD-10-CM | POA: Diagnosis not present

## 2018-07-28 LAB — COMPREHENSIVE METABOLIC PANEL
ALT: 27 U/L (ref 0–44)
AST: 33 U/L (ref 15–41)
Albumin: 2.3 g/dL — ABNORMAL LOW (ref 3.5–5.0)
Alkaline Phosphatase: 120 U/L (ref 38–126)
Anion gap: 8 (ref 5–15)
BUN: 10 mg/dL (ref 8–23)
CO2: 23 mmol/L (ref 22–32)
Calcium: 8.6 mg/dL — ABNORMAL LOW (ref 8.9–10.3)
Chloride: 107 mmol/L (ref 98–111)
Creatinine, Ser: 0.76 mg/dL (ref 0.44–1.00)
GFR calc Af Amer: >60 mL/min (ref >60)
GFR calc non Af Amer: >60 mL/min (ref >60)
Glucose, Bld: 251 mg/dL — ABNORMAL HIGH (ref 70–99)
Potassium: 3 mmol/L — ABNORMAL LOW (ref 3.5–5.1)
Sodium: 138 mmol/L (ref 135–145)
Total Bilirubin: 3.2 mg/dL — ABNORMAL HIGH (ref 0.3–1.2)
Total Protein: 5.5 g/dL — ABNORMAL LOW (ref 6.5–8.1)

## 2018-07-28 LAB — CBC
HCT: 26.8 % — ABNORMAL LOW (ref 36.0–46.0)
Hemoglobin: 8.1 g/dL — ABNORMAL LOW (ref 12.0–15.0)
MCH: 26.9 pg (ref 26.0–34.0)
MCHC: 30.2 g/dL (ref 30.0–36.0)
MCV: 89 fL (ref 80.0–100.0)
Platelets: 49 10*3/uL — ABNORMAL LOW (ref 150–400)
RBC: 3.01 MIL/uL — ABNORMAL LOW (ref 3.87–5.11)
RDW: 23.5 % — ABNORMAL HIGH (ref 11.5–15.5)
WBC: 3.5 10*3/uL — ABNORMAL LOW (ref 4.0–10.5)
nRBC: 0 % (ref 0.0–0.2)

## 2018-07-28 LAB — GLUCOSE, CAPILLARY
Glucose-Capillary: 224 mg/dL — ABNORMAL HIGH (ref 70–99)
Glucose-Capillary: 340 mg/dL — ABNORMAL HIGH (ref 70–99)
Glucose-Capillary: 351 mg/dL — ABNORMAL HIGH (ref 70–99)
Glucose-Capillary: 282 mg/dL — ABNORMAL HIGH (ref 70–99)

## 2018-07-28 LAB — AMMONIA: Ammonia: 64 umol/L — ABNORMAL HIGH (ref 9–35)

## 2018-07-28 MED ORDER — POTASSIUM CHLORIDE CRYS ER 20 MEQ PO TBCR
40.0000 meq | EXTENDED_RELEASE_TABLET | ORAL | Status: AC
  Administered 2018-07-28 (×2): 40 meq via ORAL
  Filled 2018-07-28 (×2): qty 2

## 2018-07-28 MED ORDER — TRAMADOL HCL 50 MG PO TABS
50.0000 mg | ORAL_TABLET | Freq: Four times a day (QID) | ORAL | Status: DC | PRN
  Administered 2018-07-28 – 2018-07-29 (×2): 50 mg via ORAL
  Filled 2018-07-28 (×2): qty 1

## 2018-07-28 MED ORDER — RIFAXIMIN 550 MG PO TABS
550.0000 mg | ORAL_TABLET | Freq: Three times a day (TID) | ORAL | Status: DC
  Administered 2018-07-28 – 2018-07-31 (×11): 550 mg via ORAL
  Filled 2018-07-28 (×11): qty 1

## 2018-07-28 NOTE — Progress Notes (Signed)
Report is given to [place], Therapist, sports. Vital signs are stable. Husband was informed about possibility of transfer. Patient is off monitor per MD orders.

## 2018-07-28 NOTE — Progress Notes (Signed)
PROGRESS NOTE    Maria [name]  AXK:[id number] DOB: 1956-09-05 DOA: 07/26/2018 PCP: Glenda Chroman, MD   Brief Narrative: ) 62 year old with past medical history relevant for [name] cirrhosis status post TI PS at [facility] in October 2019,GAVE/portal hypertensive gastropathy status post argon laser ablation, type 2 diabetes on insulin, upper lipidemia, hypothyroidism, depression/anxiety, hyperlipidemia who presents with altered mental status in the setting of hyperammonemia concerning for hepatic encephalopathy.   Assessment & Plan:   Principal Problem:   Hepatic encephalopathy (HCC) Active Problems:   Cirrhosis, nonalcoholic (HCC)   GERD (gastroesophageal reflux disease)   Hyperammonemia (HCC)   Hypothyroidism   Anemia   #) Hepatic encephalopathy/[place] cirrhosis: Likely secondary to recent TI PS and noncompliance with bowel movements. -Continue lactulose with goal of 3-4 bowel movements a day - Continue t home spironolactone 25 mg daily -Continue home torsemide 20 mg daily -Start rifaximin 550 twice daily   #) Type 2 diabetes on insulin: -Continue glargine 35 units nightly -Sliding scale insulin, SHS - Carb restricted diet  #) Thrombocytopenia: Likely secondary to splenic sequestration.  #) Anemia: This appears to be stable.  She has multiple reasons for anemia including portal hypertensive gastropathy likely second to create chronic GI losses, anemia of liver disease, iron deficiency anemia. -We will consider sending off iron studies if patient is here for prolonged period of time  #) Hypothyroidism: -Continue levothyroxine 25 mcg daily  #) Hyperlipidemia: -Continue rosuvastatin 5 mg daily  #) Pain/psych: -Continue buspirone 5 mg twice daily -Continue paroxetine 40 mg every morning  Fluids: Tolerating p.o. Electrolytes: Monitor and supplement Nutrition: Carb restricted diet  Prophylaxis: SCDs  Disposition: Pending resolution of altered mental status  Full  code  Consultants:   None  Procedures:   None  Antimicrobials:  None   Subjective: This morning patient is more awake and alert.  On discussion with her husband he reports that she is better than when she came in but still not quite as good as she was last night.  She is slowed but much more alert and oriented.  She denies any nausea, vomiting, abdominal pain, cough, congestion, rhinorrhea.  Objective: Vitals:   07/28/18 0005 07/28/18 0400 07/28/18 0500 07/28/18 0817  BP: 120/60     Pulse: 89   93  Resp: 14   (!) 21  Temp: 98.3 F (36.8 C) 98.2 F (36.8 C)  97.9 F (36.6 C)  TempSrc: Oral Oral  Oral  SpO2: 95%   94%  Weight:   81.6 kg   Height:        Intake/Output Summary (Last 24 hours) at 07/28/2018 1105 Last data filed at 07/27/2018 1800 Gross per 24 hour  Intake 600 ml  Output -  Net 600 ml   Filed Weights   07/26/18 2223 07/28/18 0500  Weight: 101.8 kg 81.6 kg    Examination:  General exam: Appears calm and comfortable  Respiratory system: Clear to auscultation. Respiratory effort normal. Cardiovascular system: Regular rate and rhythm, no murmurs Gastrointestinal system: Soft, nondistended, no rebound or guarding, plus bowel sounds Central nervous system: Alert and oriented.   Very slowed, moving all extremities, mild asterixis noted Extremities: Trace lower extremity edema. Skin: No rashes over visible skin Psychiatry: Slowed but intact judgment and insight,    Data Reviewed: I have personally reviewed following labs and imaging studies  CBC: Recent Labs  Lab 07/26/18 2228 07/27/18 0406 07/28/18 0416  WBC 3.7* 3.0* 3.5*  NEUTROABS 2.3 1.7  --   HGB  9.4* 8.2* 8.1*  HCT 31.5* 27.4* 26.8*  MCV 89.2 91.3 89.0  PLT 45* 43* 49*   Basic Metabolic Panel: Recent Labs  Lab 07/26/18 2228 07/27/18 0406 07/28/18 0416  NA 137 140 138  K 3.6 3.4* 3.0*  CL 109 111 107  CO2 22 22 23   GLUCOSE 347* 286* 251*  BUN 11 11 10   CREATININE 0.74 0.65  0.76  CALCIUM 9.0 9.0 8.6*   GFR: Estimated Creatinine Clearance: 71.2 mL/min (by C-G formula based on SCr of 0.76 mg/dL). Liver Function Tests: Recent Labs  Lab 07/26/18 2228 07/27/18 0406 07/28/18 0416  AST 37 29 33  ALT 31 27 27   ALKPHOS 156* 128* 120  BILITOT 2.9* 2.8* 3.2*  PROT 6.6 5.6* 5.5*  ALBUMIN 2.8* 2.4* 2.3*   No results for input(s): LIPASE, AMYLASE in the last 168 hours. Recent Labs  Lab 07/26/18 2228 07/27/18 0405  AMMONIA 135* 122*   Coagulation Profile: Recent Labs  Lab 07/27/18 0455  INR 1.53   Cardiac Enzymes: No results for input(s): CKTOTAL, CKMB, CKMBINDEX, TROPONINI in the last 168 hours. BNP (last 3 results) No results for input(s): PROBNP in the last 8760 hours. HbA1C: No results for input(s): HGBA1C in the last 72 hours. CBG: Recent Labs  Lab 07/27/18 0718 07/27/18 1136 07/27/18 1642 07/27/18 2150 07/28/18 0816  GLUCAP 231* 255* 335* 294* 224*   Lipid Profile: No results for input(s): CHOL, HDL, LDLCALC, TRIG, CHOLHDL, LDLDIRECT in the last 72 hours. Thyroid Function Tests: Recent Labs    07/27/18 0406  TSH 1.659   Anemia Panel: No results for input(s): VITAMINB12, FOLATE, FERRITIN, TIBC, IRON, RETICCTPCT in the last 72 hours. Sepsis Labs: No results for input(s): PROCALCITON, LATICACIDVEN in the last 168 hours.  Recent Results (from the past 240 hour(s))  MRSA PCR Screening     Status: None   Collection Time: 07/27/18  2:37 AM  Result Value Ref Range Status   MRSA by PCR NEGATIVE NEGATIVE Final    Comment:        The GeneXpert MRSA Assay (FDA approved for NASAL specimens only), is one component of a comprehensive MRSA colonization surveillance program. It is not intended to diagnose MRSA infection nor to guide or monitor treatment for MRSA infections. Performed at [facility], [address], [place], [place] [address]          Radiology Studies: No results found.      Scheduled Meds: . busPIRone  5  mg Oral BID  . heparin injection (subcutaneous)  5,000 Units Subcutaneous Q8H  . insulin aspart  0-9 Units Subcutaneous TID WC  . insulin glargine  35 Units Subcutaneous q morning - 10a  . lactulose  30 g Oral TID  . levothyroxine  25 mcg Oral QAC breakfast  . magnesium oxide  400 mg Oral Daily  . pantoprazole  40 mg Oral Daily  . PARoxetine  40 mg Oral [id number]  . potassium chloride  40 mEq Oral Q4H  . rifaximin  550 mg Oral TID  . rosuvastatin  5 mg Oral QODAY  . spironolactone  25 mg Oral Daily  . torsemide  20 mg Oral Daily   Continuous Infusions:   LOS: 0 days    Time spent: [place], MD [facility]  If 7PM-7AM, please contact night-coverage [email or web address] Password [email or web address] 07/28/2018, 11:05 AM

## 2018-07-29 DIAGNOSIS — R410 Disorientation, unspecified: Secondary | ICD-10-CM | POA: Diagnosis present

## 2018-07-29 DIAGNOSIS — E039 Hypothyroidism, unspecified: Secondary | ICD-10-CM | POA: Diagnosis present

## 2018-07-29 DIAGNOSIS — K766 Portal hypertension: Secondary | ICD-10-CM | POA: Diagnosis present

## 2018-07-29 DIAGNOSIS — M810 Age-related osteoporosis without current pathological fracture: Secondary | ICD-10-CM | POA: Diagnosis present

## 2018-07-29 DIAGNOSIS — I5032 Chronic diastolic (congestive) heart failure: Secondary | ICD-10-CM | POA: Diagnosis present

## 2018-07-29 DIAGNOSIS — E785 Hyperlipidemia, unspecified: Secondary | ICD-10-CM | POA: Diagnosis present

## 2018-07-29 DIAGNOSIS — K7581 Nonalcoholic steatohepatitis (NASH): Secondary | ICD-10-CM | POA: Diagnosis present

## 2018-07-29 DIAGNOSIS — I110 Hypertensive heart disease with heart failure: Secondary | ICD-10-CM | POA: Diagnosis present

## 2018-07-29 DIAGNOSIS — D509 Iron deficiency anemia, unspecified: Secondary | ICD-10-CM | POA: Diagnosis present

## 2018-07-29 DIAGNOSIS — Z6841 Body Mass Index (BMI) 40.0 and over, adult: Secondary | ICD-10-CM | POA: Diagnosis not present

## 2018-07-29 DIAGNOSIS — E7800 Pure hypercholesterolemia, unspecified: Secondary | ICD-10-CM | POA: Diagnosis present

## 2018-07-29 DIAGNOSIS — I8510 Secondary esophageal varices without bleeding: Secondary | ICD-10-CM | POA: Diagnosis present

## 2018-07-29 DIAGNOSIS — F419 Anxiety disorder, unspecified: Secondary | ICD-10-CM | POA: Diagnosis present

## 2018-07-29 DIAGNOSIS — I2510 Atherosclerotic heart disease of native coronary artery without angina pectoris: Secondary | ICD-10-CM | POA: Diagnosis present

## 2018-07-29 DIAGNOSIS — E876 Hypokalemia: Secondary | ICD-10-CM | POA: Diagnosis present

## 2018-07-29 DIAGNOSIS — K7200 Acute and subacute hepatic failure without coma: Secondary | ICD-10-CM | POA: Diagnosis present

## 2018-07-29 DIAGNOSIS — D649 Anemia, unspecified: Secondary | ICD-10-CM | POA: Diagnosis not present

## 2018-07-29 DIAGNOSIS — E7220 Disorder of urea cycle metabolism, unspecified: Secondary | ICD-10-CM | POA: Diagnosis not present

## 2018-07-29 DIAGNOSIS — M797 Fibromyalgia: Secondary | ICD-10-CM | POA: Diagnosis present

## 2018-07-29 DIAGNOSIS — E1165 Type 2 diabetes mellitus with hyperglycemia: Secondary | ICD-10-CM | POA: Diagnosis present

## 2018-07-29 DIAGNOSIS — F329 Major depressive disorder, single episode, unspecified: Secondary | ICD-10-CM | POA: Diagnosis present

## 2018-07-29 DIAGNOSIS — K7290 Hepatic failure, unspecified without coma: Secondary | ICD-10-CM | POA: Diagnosis not present

## 2018-07-29 DIAGNOSIS — K7460 Unspecified cirrhosis of liver: Secondary | ICD-10-CM | POA: Diagnosis present

## 2018-07-29 DIAGNOSIS — I272 Pulmonary hypertension, unspecified: Secondary | ICD-10-CM | POA: Diagnosis present

## 2018-07-29 DIAGNOSIS — K219 Gastro-esophageal reflux disease without esophagitis: Secondary | ICD-10-CM | POA: Diagnosis present

## 2018-07-29 DIAGNOSIS — D6959 Other secondary thrombocytopenia: Secondary | ICD-10-CM | POA: Diagnosis present

## 2018-07-29 LAB — BASIC METABOLIC PANEL
Anion gap: 6 (ref 5–15)
BUN: 12 mg/dL (ref 8–23)
CO2: 22 mmol/L (ref 22–32)
Calcium: 8.3 mg/dL — ABNORMAL LOW (ref 8.9–10.3)
Chloride: 109 mmol/L (ref 98–111)
Creatinine, Ser: 0.68 mg/dL (ref 0.44–1.00)
GFR calc Af Amer: >60 mL/min (ref >60)
GFR calc non Af Amer: >60 mL/min (ref >60)
Glucose, Bld: 240 mg/dL — ABNORMAL HIGH (ref 70–99)
Potassium: 3.4 mmol/L — ABNORMAL LOW (ref 3.5–5.1)
Sodium: 137 mmol/L (ref 135–145)

## 2018-07-29 LAB — GLUCOSE, CAPILLARY
Glucose-Capillary: 216 mg/dL — ABNORMAL HIGH (ref 70–99)
Glucose-Capillary: 224 mg/dL — ABNORMAL HIGH (ref 70–99)
Glucose-Capillary: 327 mg/dL — ABNORMAL HIGH (ref 70–99)
Glucose-Capillary: 316 mg/dL — ABNORMAL HIGH (ref 70–99)

## 2018-07-29 LAB — AMMONIA: Ammonia: 90 umol/L — ABNORMAL HIGH (ref 9–35)

## 2018-07-29 MED ORDER — FLEET ENEMA 7-19 GM/118ML RE ENEM
1.0000 | ENEMA | Freq: Once | RECTAL | Status: AC
  Administered 2018-07-29: 1 via RECTAL

## 2018-07-29 MED ORDER — POTASSIUM CHLORIDE CRYS ER 20 MEQ PO TBCR
40.0000 meq | EXTENDED_RELEASE_TABLET | Freq: Once | ORAL | Status: AC
  Administered 2018-07-29: 40 meq via ORAL
  Filled 2018-07-29: qty 4

## 2018-07-29 MED ORDER — ZINC SULFATE 220 (50 ZN) MG PO CAPS
220.0000 mg | ORAL_CAPSULE | Freq: Every day | ORAL | Status: DC
  Administered 2018-07-29 – 2018-07-31 (×3): 220 mg via ORAL
  Filled 2018-07-29 (×3): qty 1

## 2018-07-29 NOTE — Progress Notes (Addendum)
Inpatient Diabetes Program Recommendations  AACE/ADA: New Consensus Statement on Inpatient Glycemic Control (2015)  Target Ranges:  Prepandial:   less than 140 mg/dL      Peak postprandial:   less than 180 mg/dL (1-2 hours)      Critically ill patients:  140 - 180 mg/dL   Results for EMILEE, MARKET (MRN [id number]) as of 07/29/2018 09:37  Ref. Range 07/28/2018 08:16 07/28/2018 11:29 07/28/2018 16:22 07/28/2018 21:35  Glucose-Capillary Latest Ref Range: 70 - 99 mg/dL 224 (H)  3 units NOVOLOG  340 (H)  7 units NOVOLOG +  35 units LANTUS  351 (H)  9 units NOVOLOG  282 (H)   Results for BIANKA, LIBERATI (MRN [id number]) as of 07/29/2018 09:37  Ref. Range 07/29/2018 07:56  Glucose-Capillary Latest Ref Range: 70 - 99 mg/dL 224 (H)  3 units NOVOLOG     Admit with: Hepatic encephalopathy/ Hyperammonemia   History: DM, CHF, Cirrhosis  Home DM Meds: Humalog 3-12 units TID       Lantus 35 units QAM  Current Orders: Lantus 35 units QAM      Novolog Sensitive Correction Scale/ SSI (0-9 units) TID AC      CBGs elevated in the AM the last 2 days.  Also having elevated postprandial glucose levels.    MD- Please consider the following in-hospital insulin adjustments:  1. Increase Lantus to 38 units Daily  (10% increase)  2. Start Novolog Meal Coverage: Novolog 6 units TID with meals  (Please add the following Hold Parameters: Hold if pt eats <50% of meal, Hold if pt NPO)     --Will follow patient during hospitalization--  Wyn Quaker RN, MSN, CDE Diabetes Coordinator Inpatient Glycemic Control Team Team Pager: [phone] (8a-5p)

## 2018-07-29 NOTE — Consult Note (Signed)
Referring Provider: Thomes Dinning, MD Primary Care Physician:  Glenda Chroman, MD Primary Gastroenterologist:  Dr. Laural Golden  Reason for Consultation:    Hepatic encephalopathy  HPI:   Patient is 62 year old Caucasian female who has cirrhosis secondary to NASH complicated by esophageal variceal bleed(November 2011 and November 2013) with subsequent ablation of esophageal varices, chronic GI bleed secondary to gastric antral vascular ectasia with poor control with endoscopic therapy, hepatic encephalopathy as well as ascites Who was evaluated at [facility] last month.  During that admission she required 3 units of PRBCs.  She underwent a creation of TIPS on 07/12/2018 along with embolization of 2 of the outflow veins from a large mesocaval shunt.  Post TIPS pressure gradient drop to 2 from baseline of 10 mmHg.  She also had ultrasound-guided abdominal paracenteses with removal of 5.8 L of fluid. Patient was discharged on lactulose. She was doing well and in the evening of 07/26/2018 when her husband noted to be confused.  Her husband believes she did not take her morning dose of lactulose because she had office appointment with Dr. Woody Seller and she may have missed a second dose as well. Patient was evaluated emergency room and noted to have elevated serum ammonia level of 135.  She was felt to have hepatic encephalopathy and hospitalized. She was maintained on lactulose and Xifaxan was added.  Over the weekend she felt better and according to her husband became alert and not confused anymore.  She was given tramadol dose yesterday for back pain and she received another one today.  Today she has been drowsy and confused.  She has not experienced nausea vomiting abdominal pain melena or rectal bleeding.  She has not had a bowel movement today.  She had at least one large bowel movement yesterday. Review of the systems is negative for fever chills cough or dysuria. Patient had first episode of hepatic  encephalopathy in October last year when she was over diuresed.  She has been maintained on lactulose.  She has been evaluated at [facility] for liver transplant but has not been listed yet. She states she was begun on BuSpar and Paxil for depression.  She feels she has not had any side effects with this therapy.   Past Medical History:  Diagnosis Date  . Anemia   . CHF (congestive heart failure) ([place])   . Cirrhosis ([place])   . Depression   . Diabetes mellitus   . Esophageal bleed, non-variceal   . Eye hemorrhage    Behind left eye  . Fibromyalgia   . Hypercholesteremia   . Hypertension   . Hypothyroidism   . NAFLD (nonalcoholic fatty liver disease)   . Osteoarthrosis   . Osteoporosis   . PONV (postoperative nausea and vomiting)   . UTI (urinary tract infection) 11/12 end of the month   Patient feels that she passed a kidney stone at that time    Past Surgical History:  Procedure Laterality Date  . APPENDECTOMY  1980  . BILATERAL SALPINGOOPHORECTOMY    . CHOLECYSTECTOMY    . COLONOSCOPY  03/15/2011  . COLONOSCOPY N/A 03/24/2016   Procedure: COLONOSCOPY;  Surgeon: [name], MD;  [facility];  Service: Endoscopy;  Laterality: N/A;  855  . ESOPHAGEAL BANDING  04/24/2012   Procedure: ESOPHAGEAL BANDING;  Surgeon: [name], MD;  [facility];  Service: Endoscopy;  Laterality: N/A;  . Esophageal BANDING  08/03/2012   [facility] in [place]  [place]  09/24/2012   Procedure: ESOPHAGEAL BANDING;  Surgeon: [name], MD;  Location: [facility];  Service: Endoscopy;  Laterality: N/A;  Banding x 3  . ESOPHAGEAL BANDING N/A 01/29/2013   Procedure: ESOPHAGEAL BANDING;  Surgeon: [name], MD;  [facility];  Service: Endoscopy;  Laterality: N/A;  . ESOPHAGEAL BANDING N/A 06/19/2014   Procedure: ESOPHAGEAL BANDING;  Surgeon: [name], MD;  [facility];  Service: Endoscopy;  Laterality:  N/A;  . ESOPHAGEAL BANDING N/A 01/05/2016   Procedure: ESOPHAGEAL BANDING;  Surgeon: [name], MD;  [facility];  Service: Endoscopy;  Laterality: N/A;  . ESOPHAGEAL BANDING N/A 08/03/2016   Procedure: ESOPHAGEAL BANDING;  Surgeon: [name], MD;  [facility];  Service: Endoscopy;  Laterality: N/A;  . ESOPHAGEAL BANDING N/A 05/02/2017   Procedure: ESOPHAGEAL BANDING;  Surgeon: [name], MD;  [facility];  Service: Endoscopy;  Laterality: N/A;  . ESOPHAGOGASTRODUODENOSCOPY  04/24/2012   Procedure: ESOPHAGOGASTRODUODENOSCOPY (EGD);  Surgeon: [name], MD;  [facility];  Service: Endoscopy;  Laterality: N/A;  300  . ESOPHAGOGASTRODUODENOSCOPY N/A 01/29/2013   Procedure: ESOPHAGOGASTRODUODENOSCOPY (EGD);  Surgeon: [name], MD;  [facility];  Service: Endoscopy;  Laterality: N/A;  1200  . ESOPHAGOGASTRODUODENOSCOPY N/A 05/22/2013   Procedure: ESOPHAGOGASTRODUODENOSCOPY (EGD);  Surgeon: [name], MD;  [facility];  Service: Endoscopy;  Laterality: N/A;  1:55  . ESOPHAGOGASTRODUODENOSCOPY N/A 12/31/2013   Procedure: ESOPHAGOGASTRODUODENOSCOPY (EGD);  Surgeon: [name], MD;  [facility];  Service: Endoscopy;  Laterality: N/A;  1200  . ESOPHAGOGASTRODUODENOSCOPY N/A 06/19/2014   Procedure: ESOPHAGOGASTRODUODENOSCOPY (EGD);  Surgeon: [name], MD;  [facility];  Service: Endoscopy;  Laterality: N/A;  1055  . ESOPHAGOGASTRODUODENOSCOPY N/A 01/15/2015   Procedure: ESOPHAGOGASTRODUODENOSCOPY (EGD);  Surgeon: [name], MD;  [facility];  Service: Endoscopy;  Laterality: N/A;  730 - moved to 9:45 - moved to 1250-Ann notified pt  . ESOPHAGOGASTRODUODENOSCOPY N/A 06/30/2015   Procedure: ESOPHAGOGASTRODUODENOSCOPY (EGD);  Surgeon: [name], MD;  [facility];  Service: Endoscopy;  Laterality: N/A;  1200  . ESOPHAGOGASTRODUODENOSCOPY N/A  01/05/2016   Procedure: ESOPHAGOGASTRODUODENOSCOPY (EGD);  Surgeon: [name], MD;  [facility];  Service: Endoscopy;  Laterality: N/A;  830  . ESOPHAGOGASTRODUODENOSCOPY N/A 08/03/2016   Procedure: ESOPHAGOGASTRODUODENOSCOPY (EGD);  Surgeon: [name], MD;  [facility];  Service: Endoscopy;  Laterality: N/A;  930  . ESOPHAGOGASTRODUODENOSCOPY N/A 05/02/2017   Procedure: ESOPHAGOGASTRODUODENOSCOPY (EGD);  Surgeon: [name], MD;  [facility];  Service: Endoscopy;  Laterality: N/A;  12:00  . ESOPHAGOGASTRODUODENOSCOPY N/A 08/17/2017   Procedure: ESOPHAGOGASTRODUODENOSCOPY (EGD);  Surgeon: [name], MD;  [facility];  Service: Endoscopy;  Laterality: N/A;  8:15  . ESOPHAGOGASTRODUODENOSCOPY N/A 09/12/2017   Procedure: ESOPHAGOGASTRODUODENOSCOPY (EGD);  Surgeon: [name], MD;  [facility];  Service: Endoscopy;  Laterality: N/A;  1040  . ESOPHAGOGASTRODUODENOSCOPY N/A 10/10/2017   Procedure: ESOPHAGOGASTRODUODENOSCOPY (EGD);  Surgeon: [name], MD;  [facility];  Service: Endoscopy;  Laterality: N/A;  225  . ESOPHAGOGASTRODUODENOSCOPY N/A 11/09/2017   Procedure: ESOPHAGOGASTRODUODENOSCOPY (EGD);  Surgeon: [name], MD;  [facility];  Service: Endoscopy;  Laterality: N/A;  . ESOPHAGOGASTRODUODENOSCOPY (EGD) WITH PROPOFOL  09/24/2012  Procedure: ESOPHAGOGASTRODUODENOSCOPY (EGD) WITH PROPOFOL;  Surgeon: [name], MD;  [facility];  Service: Endoscopy;  Laterality: N/A;  GE junction at 36  . ESOPHAGOGASTRODUODENOSCOPY (EGD) WITH PROPOFOL N/A 07/06/2017   Procedure: ESOPHAGOGASTRODUODENOSCOPY (EGD) WITH PROPOFOL;  Surgeon: [name], MD;  [facility];  Service: Endoscopy;  Laterality: N/A;  . ESOPHAGOGASTRODUODENOSCOPY W/ BANDING  08/2010  . GIVENS CAPSULE STUDY N/A 07/05/2017   Procedure: GIVENS CAPSULE STUDY;  Surgeon: [name], MD;   [facility];  Service: Endoscopy;  Laterality: N/A;  . HOT HEMOSTASIS N/A 08/17/2017   Procedure: HOT HEMOSTASIS (ARGON PLASMA COAGULATION/BICAP);  Surgeon: [name], MD;  [facility];  Service: Endoscopy;  Laterality: N/A;  . HOT HEMOSTASIS  09/12/2017   Procedure: HOT HEMOSTASIS (ARGON PLASMA COAGULATION/BICAP);  Surgeon: [name], MD;  [facility];  Service: Endoscopy;;  gastric  . HOT HEMOSTASIS  10/10/2017   Procedure: HOT HEMOSTASIS (ARGON PLASMA COAGULATION/BICAP);  Surgeon: [name], MD;  [facility];  Service: Endoscopy;;  . POLYPECTOMY  03/24/2016   Procedure: POLYPECTOMY;  Surgeon: [name], MD;  [facility];  Service: Endoscopy;;  sigmoid polyp  . RIGHT HEART CATH N/A 04/16/2017   Procedure: Right Heart Cath;  Surgeon: [name], MD;  Location: [place] [facility];  Service: Cardiovascular;  Laterality: N/A;  . RIGHT HEART CATH N/A 07/12/2017   Procedure: RIGHT HEART CATH;  Surgeon: [name], MD;  Location: [place] [facility];  Service: Cardiovascular;  Laterality: N/A;  . TONSILLECTOMY    . UPPER GASTROINTESTINAL ENDOSCOPY  03/15/2011   EGD ED BANDING/TCS  . UPPER GASTROINTESTINAL ENDOSCOPY  09/05/2010  . UPPER GASTROINTESTINAL ENDOSCOPY  08/11/2010  . VAGINAL HYSTERECTOMY      Prior to Admission medications   Medication Sig Start Date End Date Taking? Authorizing Provider  busPIRone (BUSPAR) 5 MG tablet Take 5 mg by mouth 2 (two) times daily.   Yes [provider]  HUMALOG 100 UNIT/ML injection Inject 3-12 Units into the skin 3 (three) times daily with meals.  07/01/18  Yes [provider]  Insulin Glargine (LANTUS SOLOSTAR) 100 UNIT/ML Solostar Pen Inject 38 Units daily at 10 pm into the skin. Patient taking differently: Inject 35 Units into the skin every morning.  07/22/17  Yes [name], [name], MD  Insulin Pen Needle 31G X 5 MM MISC Use with each  insulin injection 07/22/17  Yes [name], [name], MD  lactulose ([place]) 10 GM/15ML solution TAKE 30 MLS BY MOUTH FOUR TIMES DAILY Patient taking differently: Take 20 g by mouth daily as needed for mild constipation or moderate constipation.  01/07/18  Yes Rehman, Mechele Dawley, MD  levothyroxine (SYNTHROID, LEVOTHROID) 25 MCG tablet Take 25 mcg by mouth daily before breakfast.    Yes [provider]  magnesium oxide (MAG-OX) 400 MG tablet Take 400 mg by mouth daily.    Yes [provider]  multivitamin (VIT W/EXTRA C) CHEW chewable tablet Chew 1 tablet by mouth daily.   Yes [provider]  OXYGEN Inhale 2 L into the lungs daily as needed (FOR BREATHING).    Yes [provider]  pantoprazole (PROTONIX) 40 MG tablet Take 1 tablet (40 mg total) by mouth 2 (two) times daily. Patient taking differently: Take 40 mg by mouth daily.  07/07/18  Yes Barton Dubois, MD  PARoxetine (PAXIL) 40 MG tablet Take 40 mg by  mouth every morning.   Yes [provider]  potassium chloride (K-DUR) 10 MEQ tablet Take 10 mEq by mouth 2 (two) times daily.  03/26/18  Yes [provider]  rosuvastatin (CRESTOR) 5 MG tablet Take 1 tablet (5 mg total) by mouth every other day. 06/27/17 10/09/18 Yes [name], MD  spironolactone (ALDACTONE) 50 MG tablet Take 0.5 tablets (25 mg total) by mouth 2 (two) times daily. Patient taking differently: Take 25 mg by mouth daily.  07/07/18  Yes Barton Dubois, MD  torsemide (DEMADEX) 20 MG tablet Take 1 tablet (20 mg total) by mouth daily. 12/18/17  Yes [name], MD  trimethoprim-polymyxin b (POLYTRIM) ophthalmic solution Place 1 drop into the left eye See admin instructions. Use 3 to 4 times daily for 2 days following monthly eye injection 05/06/18  Yes [provider]  cholecalciferol (VITAMIN D) 1000 UNITS tablet Take 1,000 Units by mouth 3 (three) times a week.     [provider]    Current  Facility-Administered Medications  Medication Dose Route Frequency Provider Last Rate Last Dose  . busPIRone (BUSPAR) tablet 5 mg  5 mg Oral BID Reubin Milan, MD   5 mg at 07/29/18 0835  . insulin aspart (novoLOG) injection 0-9 Units  0-9 Units Subcutaneous TID WC Reubin Milan, MD   7 Units at 07/29/18 1218  . insulin glargine (LANTUS) injection 35 Units  35 Units Subcutaneous q morning - 10a Reubin Milan, MD   35 Units at 07/29/18 [phone]  . lactulose (CHRONULAC) 10 GM/15ML solution 30 g  30 g Oral TID Thomes Dinning C, MD   30 g at 07/29/18 0835  . levothyroxine (SYNTHROID, LEVOTHROID) tablet 25 mcg  25 mcg Oral QAC breakfast Purohit, Konrad Dolores, MD   25 mcg at 07/29/18 0535  . magnesium oxide (MAG-OX) tablet 400 mg  400 mg Oral Daily Purohit, Shrey C, MD   400 mg at 07/29/18 0835  . ondansetron (ZOFRAN) tablet 4 mg  4 mg Oral Q6H PRN Reubin Milan, MD       Or  . ondansetron [facility]) injection 4 mg  4 mg Intravenous Q6H PRN Reubin Milan, MD      . pantoprazole (PROTONIX) EC tablet 40 mg  40 mg Oral Daily Purohit, Konrad Dolores, MD   40 mg at 07/29/18 0835  . PARoxetine (PAXIL) tablet 40 mg  40 mg Oral BH-q7a Purohit, Konrad Dolores, MD   40 mg at 07/29/18 0536  . rifaximin (XIFAXAN) tablet 550 mg  550 mg Oral TID Cristy Folks, MD   550 mg at 07/29/18 0835  . rosuvastatin (CRESTOR) tablet 5 mg  5 mg Oral QODAY Purohit, Shrey C, MD   5 mg at 07/28/18 0919  . sodium phosphate (FLEET) 7-19 GM/118ML enema 1 enema  1 enema Rectal Once Rehman, Najeeb U, MD      . spironolactone (ALDACTONE) tablet 25 mg  25 mg Oral Daily Purohit, Shrey C, MD   25 mg at 07/29/18 0835  . torsemide (DEMADEX) tablet 20 mg  20 mg Oral Daily Purohit, Shrey C, MD   20 mg at 07/29/18 0834    Allergies as of 07/26/2018 - Review Complete 07/26/2018  Allergen Reaction Noted  . Ferumoxytol Other (See Comments) 07/08/2017  . Tramadol hcl Other (See Comments) 07/21/2017    Family History  Problem Relation  Age of Onset  . Lung cancer Mother   . Diabetes Father   . Diabetes  Sister   . Hypertension Sister   . Hypothyroidism Sister   . Colon cancer Brother   . Diabetes Sister   . Hypothyroidism Brother   . Healthy Daughter   . Obesity Daughter   . Hypertension Daughter     Social History   Socioeconomic History  . Marital status: Married    Spouse name: Not on file  . Number of children: Not on file  . Years of education: Not on file  . Highest education level: Not on file  Occupational History  . Occupation: retired   Scientific laboratory technician  . Financial resource strain: Not very hard  . Food insecurity:    Worry: Never true    Inability: Never true  . Transportation needs:    Medical: No    Non-medical: No  Tobacco Use  . Smoking status: Never Smoker  . Smokeless tobacco: Never Used  Substance and Sexual Activity  . Alcohol use: No    Alcohol/week: 0.0 standard drinks  . Drug use: No  . Sexual activity: Not on file  Lifestyle  . Physical activity:    Days per week: 0 days    Minutes per session: 0 min  . Stress: Very much  Relationships  . Social connections:    Talks on phone: Not on file    Gets together: Not on file    Attends religious service: Not on file    Active member of club or organization: Not on file    Attends meetings of clubs or organizations: Not on file    Relationship status: Not on file  . Intimate partner violence:    Fear of current or ex partner: Not on file    Emotionally abused: Not on file    Physically abused: Not on file    Forced sexual activity: Not on file  Other Topics Concern  . Not on file  Social History Narrative   Lives with husband     Review of Systems: See HPI, otherwise normal ROS  Physical Exam: Temp:  [98 F (36.7 C)-98.7 F (37.1 C)] 98.7 F (37.1 C) (11/11 0540) Pulse Rate:  [85-87] 87 (11/11 0540) Resp:  [16-21] 16 (11/11 0540) BP: (138-142)/(59-88) 142/59 (11/11 0540) SpO2:  [96 %-98 %] 98 % (11/11  0540) Weight:  [81.3 kg] 81.3 kg (11/10 2240) Last BM Date: 07/29/18  Patient is alert but she is oriented to place and time. She has mild asterixis. Conjunctiva is pale.  Sclerae nonicteric. Oral pharyngeal mucosa is unremarkable. No thyromegaly or neck masses noted. Cardiac exam with regular rhythm normal S1 and S2.  She has faint systolic ejection murmur best heard at aortic area. Auscultation of lungs reveal vesicular breath sounds bilaterally. Abdomen is symmetrical.  She has long scar in right upper quadrant.  Bowel sounds are normal.  On palpation abdomen is soft and nontender.  Spleen is not palpable.  Liver is firm below the costal margin No peripheral edema or clubbing noted.     Recent Labs    07/26/18 2228 07/27/18 0406 07/28/18 0416  WBC 3.7* 3.0* 3.5*  HGB 9.4* 8.2* 8.1*  HCT 31.5* 27.4* 26.8*  PLT 45* 43* 49*   BMET Recent Labs    07/27/18 0406 07/28/18 0416 07/29/18 0450  NA 140 138 137  K 3.4* 3.0* 3.4*  CL 111 107 109  CO2 22 23 22   GLUCOSE 286* 251* 240*  BUN 11 10 12   CREATININE 0.65 0.76 0.68  CALCIUM 9.0 8.6* 8.3*  LFT Recent Labs    07/28/18 0416  PROT 5.5*  ALBUMIN 2.3*  AST 33  ALT 27  ALKPHOS 120  BILITOT 3.2*   PT/INR Recent Labs    07/27/18 0455  LABPROT 18.2*  INR 1.53    Studies/Results: No results found.  Assessment;  Patient is 62 year old Caucasian female with advanced cirrhosis secondary to NASH who presents with hepatic encephalopathy and has been slow to respond to therapy. Patient's first episode of hepatic encephalopathy was 1 year ago when she was over diuresed and dehydrated. This episode appears to be secondary to recent TIPS placement.  It appears she did not take scheduled lactulose on the day of hospitalization.  There are no other triggers such as infection overt GI bleed or dehydration. Minor setback may have resulted from tramadol. Patient appears to be on adequate dose of lactulose but her bowels  are not moving like they should. Agree with adding Xifaxan.  Anemia.  Her anemia is felt to be multifactorial.  No evidence of overt GI bleed.  She also has anemia of chronic disease.  Thrombocytopenia.  She has chronic thrombocytopenia secondary to cirrhosis/splenomegaly.   Recommendations;  Discontinue tramadol. Fleets enema x1 now. Monitor stool frequency and adjust lactulose further if necessary. Will contact patient's transplant coordinator at [facility] and bring up-to-date about her condition. CBC metabolic 7 and serum ammonia in a.m.   LOS: 0 days   Najeeb Rehman  07/29/2018, 2:54 PM

## 2018-07-29 NOTE — Plan of Care (Signed)
  Problem: Education: Goal: Knowledge of General Education information will improve Description Including pain rating scale, medication(s)/side effects and non-pharmacologic comfort measures Outcome: Progressing   Problem: Health Behavior/Discharge Planning: Goal: Ability to manage health-related needs will improve Outcome: Progressing   Problem: Clinical Measurements: Goal: Ability to maintain clinical measurements within normal limits will improve Outcome: Progressing Goal: Will remain free from infection Outcome: Progressing Goal: Diagnostic test results will improve Outcome: Progressing Goal: Respiratory complications will improve Outcome: Progressing Goal: Cardiovascular complication will be avoided Outcome: Progressing   Problem: Activity: Goal: Risk for activity intolerance will decrease Outcome: Progressing   Problem: Nutrition: Goal: Adequate nutrition will be maintained Outcome: Progressing   Problem: Coping: Goal: Level of anxiety will decrease Outcome: Progressing   Problem: Elimination: Goal: Will not experience complications related to bowel motility Outcome: Progressing Goal: Will not experience complications related to urinary retention Outcome: Progressing   Problem: Pain Managment: Goal: General experience of comfort will improve Outcome: Progressing   Problem: Safety: Goal: Ability to remain free from injury will improve Outcome: Progressing   Problem: Skin Integrity: Goal: Risk for impaired skin integrity will decrease Outcome: Progressing   Problem: Breathing Problems - COPD Goal: Ability to tolerate increased activity will improve Outcome: Progressing

## 2018-07-29 NOTE — Progress Notes (Signed)
PROGRESS NOTE    Maria [name]  CBS:[id number] DOB: 12-16-1955 DOA: 07/26/2018 PCP: Glenda Chroman, MD   Brief Narrative: ) 62 year old with past medical history relevant for [name] cirrhosis status post TI PS at [facility] in October 2019,GAVE/portal hypertensive gastropathy status post argon laser ablation, type 2 diabetes on insulin, upper lipidemia, hypothyroidism, depression/anxiety, hyperlipidemia who presents with altered mental status in the setting of hyperammonemia concerning for hepatic encephalopathy.   Assessment & Plan:   Principal Problem:   Hepatic encephalopathy (HCC) Active Problems:   Cirrhosis, nonalcoholic (HCC)   GERD (gastroesophageal reflux disease)   Hyperammonemia (HCC)   Hypothyroidism   Anemia   #) Hepatic encephalopathy/[place] cirrhosis: Likely secondary to recent TI PS.  Patient's ammonia is decreased however she continues to be quite slowed. -Continue lactulose with goal of 3-4 bowel movements a day - Continue t home spironolactone 25 mg daily -Continue home torsemide 20 mg daily -Continue rifaximin 550 mg 3 times daily -We will consult GI and discussed with [facility] hepatology  #) Type 2 diabetes on insulin: -Continue glargine 35 units nightly -Sliding scale insulin, SHS - Carb restricted diet  #) Thrombocytopenia: Likely secondary to splenic sequestration.  #) Anemia: This appears to be stable.  She has multiple reasons for anemia including portal hypertensive gastropathy likely second to create chronic GI losses, anemia of liver disease, iron deficiency anemia. -We will consider sending off iron studies if patient is here for prolonged period of time  #) Hypothyroidism: -Continue levothyroxine 25 mcg daily  #) Hyperlipidemia: -Continue rosuvastatin 5 mg daily  #) Pain/psych: -Continue buspirone 5 mg twice daily -Continue paroxetine 40 mg every morning  Fluids: Tolerating p.o. Electrolytes: Monitor and supplement Nutrition: Carb restricted  diet  Prophylaxis: SCDs  Disposition: Pending resolution of altered mental status  Full code  Consultants:   Gastroenterology  Procedures:   None  Antimicrobials:  None   Subjective: This morning patient is very slowed and essentially unchanged from yesterday.  She reports something is "not right".  Her husband expresses concern that she is not improving.  Patient otherwise denies any nausea, vomiting, diarrhea, cough, congestion.  Objective: Vitals:   07/28/18 1623 07/28/18 2000 07/28/18 2240 07/29/18 0540  BP:  138/88 139/68 (!) 142/59  Pulse:   85 87  Resp: (!) 21  20 16   Temp: 98.3 F (36.8 C)  98 F (36.7 C) 98.7 F (37.1 C)  TempSrc: Oral  Oral Oral  SpO2:   96% 98%  Weight:   81.3 kg   Height:   5' 3"  (1.6 m)     Intake/Output Summary (Last 24 hours) at 07/29/2018 1009 Last data filed at 07/28/2018 1230 Gross per 24 hour  Intake 240 ml  Output -  Net 240 ml   Filed Weights   07/26/18 2223 07/28/18 0500 07/28/18 2240  Weight: 101.8 kg 81.6 kg 81.3 kg    Examination:  General exam: Appears calm and comfortable  Respiratory system: Clear to auscultation. Respiratory effort normal. Cardiovascular system: Regular rate and rhythm, no murmurs Gastrointestinal system: Soft, nondistended, no rebound or guarding, plus bowel sounds Central nervous system: Alert and oriented.   Very slowed, moving all extremities, mild asterixis noted Extremities: Trace lower extremity edema. Skin: No rashes over visible skin Psychiatry: Slowed but intact judgment and insight,    Data Reviewed: I have personally reviewed following labs and imaging studies  CBC: Recent Labs  Lab 07/26/18 2228 07/27/18 0406 07/28/18 0416  WBC 3.7* 3.0* 3.5*  NEUTROABS 2.3  1.7  --   HGB 9.4* 8.2* 8.1*  HCT 31.5* 27.4* 26.8*  MCV 89.2 91.3 89.0  PLT 45* 43* 49*   Basic Metabolic Panel: Recent Labs  Lab 07/26/18 2228 07/27/18 0406 07/28/18 0416 07/29/18 0450  NA 137 140  138 137  K 3.6 3.4* 3.0* 3.4*  CL 109 111 107 109  CO2 22 22 23 22   GLUCOSE 347* 286* 251* 240*  BUN 11 11 10 12   CREATININE 0.74 0.65 0.76 0.68  CALCIUM 9.0 9.0 8.6* 8.3*   GFR: Estimated Creatinine Clearance: 72.7 mL/min (by C-G formula based on SCr of 0.68 mg/dL). Liver Function Tests: Recent Labs  Lab 07/26/18 2228 07/27/18 0406 07/28/18 0416  AST 37 29 33  ALT 31 27 27   ALKPHOS 156* 128* 120  BILITOT 2.9* 2.8* 3.2*  PROT 6.6 5.6* 5.5*  ALBUMIN 2.8* 2.4* 2.3*   No results for input(s): LIPASE, AMYLASE in the last 168 hours. Recent Labs  Lab 07/26/18 2228 07/27/18 0405 07/28/18 1112 07/29/18 0845  AMMONIA 135* 122* 64* 90*   Coagulation Profile: Recent Labs  Lab 07/27/18 0455  INR 1.53   Cardiac Enzymes: No results for input(s): CKTOTAL, CKMB, CKMBINDEX, TROPONINI in the last 168 hours. BNP (last 3 results) No results for input(s): PROBNP in the last 8760 hours. HbA1C: No results for input(s): HGBA1C in the last 72 hours. CBG: Recent Labs  Lab 07/28/18 1129 07/28/18 1622 07/28/18 2135 07/29/18 0756 07/29/18 0810  GLUCAP 340* 351* 282* 224* 216*   Lipid Profile: No results for input(s): CHOL, HDL, LDLCALC, TRIG, CHOLHDL, LDLDIRECT in the last 72 hours. Thyroid Function Tests: Recent Labs    07/27/18 0406  TSH 1.659   Anemia Panel: No results for input(s): VITAMINB12, FOLATE, FERRITIN, TIBC, IRON, RETICCTPCT in the last 72 hours. Sepsis Labs: No results for input(s): PROCALCITON, LATICACIDVEN in the last 168 hours.  Recent Results (from the past 240 hour(s))  MRSA PCR Screening     Status: None   Collection Time: 07/27/18  2:37 AM  Result Value Ref Range Status   MRSA by PCR NEGATIVE NEGATIVE Final    Comment:        The GeneXpert MRSA Assay (FDA approved for NASAL specimens only), is one component of a comprehensive MRSA colonization surveillance program. It is not intended to diagnose MRSA infection nor to guide or monitor treatment  for MRSA infections. Performed at [facility], [address] [place], [place] [address]          Radiology Studies: No results found.      Scheduled Meds: . busPIRone  5 mg Oral BID  . insulin aspart  0-9 Units Subcutaneous TID WC  . insulin glargine  35 Units Subcutaneous q morning - 10a  . lactulose  30 g Oral TID  . levothyroxine  25 mcg Oral QAC breakfast  . magnesium oxide  400 mg Oral Daily  . pantoprazole  40 mg Oral Daily  . PARoxetine  40 mg Oral BH-q7a  . rifaximin  550 mg Oral TID  . rosuvastatin  5 mg Oral QODAY  . spironolactone  25 mg Oral Daily  . torsemide  20 mg Oral Daily   Continuous Infusions:   LOS: 0 days    Time spent: [place], MD [facility]  If 7PM-7AM, please contact night-coverage [email or web address] Password [email or web address] 07/29/2018, 10:09 AM

## 2018-07-30 LAB — CBC
HCT: 29.9 % — ABNORMAL LOW (ref 36.0–46.0)
Hemoglobin: 9.1 g/dL — ABNORMAL LOW (ref 12.0–15.0)
MCH: 27.3 pg (ref 26.0–34.0)
MCHC: 30.4 g/dL (ref 30.0–36.0)
MCV: 89.8 fL (ref 80.0–100.0)
Platelets: 48 10*3/uL — ABNORMAL LOW (ref 150–400)
RBC: 3.33 MIL/uL — ABNORMAL LOW (ref 3.87–5.11)
RDW: 23.3 % — ABNORMAL HIGH (ref 11.5–15.5)
WBC: 4.1 10*3/uL (ref 4.0–10.5)
nRBC: 0 % (ref 0.0–0.2)

## 2018-07-30 LAB — BASIC METABOLIC PANEL
Anion gap: 8 (ref 5–15)
BUN: 12 mg/dL (ref 8–23)
CO2: 22 mmol/L (ref 22–32)
Calcium: 8.4 mg/dL — ABNORMAL LOW (ref 8.9–10.3)
Chloride: 107 mmol/L (ref 98–111)
Creatinine, Ser: 0.73 mg/dL (ref 0.44–1.00)
GFR calc Af Amer: >60 mL/min (ref >60)
GFR calc non Af Amer: >60 mL/min (ref >60)
Glucose, Bld: 240 mg/dL — ABNORMAL HIGH (ref 70–99)
Potassium: 3.2 mmol/L — ABNORMAL LOW (ref 3.5–5.1)
Sodium: 137 mmol/L (ref 135–145)

## 2018-07-30 LAB — GLUCOSE, CAPILLARY
Glucose-Capillary: 194 mg/dL — ABNORMAL HIGH (ref 70–99)
Glucose-Capillary: 350 mg/dL — ABNORMAL HIGH (ref 70–99)
Glucose-Capillary: 397 mg/dL — ABNORMAL HIGH (ref 70–99)
Glucose-Capillary: 264 mg/dL — ABNORMAL HIGH (ref 70–99)

## 2018-07-30 LAB — AMMONIA: Ammonia: 52 umol/L — ABNORMAL HIGH (ref 9–35)

## 2018-07-30 MED ORDER — RIFAXIMIN 550 MG TABLET
ORAL_TABLET | Freq: Two times a day (BID) | ORAL | 11 refills | 0 days | Status: CP
Start: 2018-07-30 — End: ?

## 2018-07-30 NOTE — Progress Notes (Signed)
Inpatient Diabetes Program Recommendations  AACE/ADA: New Consensus Statement on Inpatient Glycemic Control (2015)  Target Ranges:  Prepandial:   less than 140 mg/dL      Peak postprandial:   less than 180 mg/dL (1-2 hours)      Critically ill patients:  140 - 180 mg/dL   Results for AMENA, DOCKHAM (MRN [id number]) as of 07/30/2018 16:32  Ref. Range 07/29/2018 07:56 07/29/2018 08:10 07/29/2018 11:29 07/29/2018 17:04 07/30/2018 07:51 07/30/2018 11:30 07/30/2018 16:29  Glucose-Capillary Latest Ref Range: 70 - 99 mg/dL 224 (H) 216 (H) 327 (H) 316 (H) 194 (H) 350 (H) 397 (H)   Admit with: Hepatic encephalopathy/ Hyperammonemia   History: DM, CHF, Cirrhosis  Home DM Meds: Humalog 3-12 units TID       Lantus 35 units QAM  Current Orders: Lantus 35 units QAM      Novolog Sensitive Correction Scale/ SSI (0-9 units) TID AC   CBGs elevated in the AM the last 2 days. Also having elevated postprandial glucose levels.   MD- Please consider the following in-hospital insulin adjustments:  1. Increase Lantus to 38 units Daily  (10% increase)  2. Start Novolog Meal Coverage: Novolog 6 units TID with meals  (Please add the following Hold Parameters: Hold if pt eats <50% of meal, Hold if pt NPO)  --Will follow patient during hospitalization--  Tama Headings RN, MSN, BC-ADM Inpatient Diabetes Coordinator Team Pager [phone] (8a-5p)

## 2018-07-30 NOTE — Evaluation (Signed)
Physical Therapy Evaluation Patient Details Name: Maria [name] MRN: [id number] DOB: [birth year] Today's Date: 07/30/2018   History of Present Illness  62 yo female with onset of hepatic encephalopathy and decompensated NASh cirrhosis, low K+, hyperammonia from no meds.  PMHx:  TIPS procedure [facility], CHF, DM, HTN, OA, UTI, anemia,   Clinical Impression  Pt was able to walk with min guard assist on hallway, able to transfer with supervised effort and demonstrates mainly slower paced gait and low endurance.  Her husband is present and plans to assist her at home with all mobility.  Will ask HHPT for follow up to ensure pt is safe in home environment but did demonstrate gait tolerance on hallways at the hospital.  Follow acutely for strengthening and balance to increase safety with gait.    Follow Up Recommendations Home health PT;Supervision for mobility/OOB    Equipment Recommendations  None recommended by PT    Recommendations for Other Services       Precautions / Restrictions Precautions Precautions: Fall Restrictions Weight Bearing Restrictions: No      Mobility  Bed Mobility Overal bed mobility: Modified Independent                Transfers Overall transfer level: Needs assistance Equipment used: 1 person hand held assist Transfers: Sit to/from Stand Sit to Stand: Min guard            Ambulation/Gait Ambulation/Gait assistance: Min guard Gait Distance (Feet): 1 Feet Assistive device: 1 person hand held assist Gait Pattern/deviations: Step-through pattern;Decreased stride length;Narrow base of support Gait velocity: reduced Gait velocity interpretation: <1.31 ft/sec, indicative of household ambulator General Gait Details: slow paced gait with careful maneuvering  Stairs Stairs: Yes Stairs assistance: Min guard Stair Management: One rail Left;Forwards;Alternating pattern Number of Stairs: 2 General stair comments: able to navigate steps for  home  Wheelchair Mobility    Modified Rankin (Stroke Patients Only)       Balance Overall balance assessment: Needs assistance Sitting-balance support: Feet supported Sitting balance-Leahy Scale: Good     Standing balance support: Single extremity supported Standing balance-Leahy Scale: Fair                               Pertinent Vitals/Pain Pain Assessment: No/denies pain    Home Living Family/patient expects to be discharged to:: Private residence Living Arrangements: Spouse/significant other Available Help at Discharge: Family Type of Home: House Home Access: Stairs to enter Entrance Stairs-Rails: Left;Can reach Advertising account executive of Steps: 2 Home Layout: One level Home Equipment: Environmental consultant - 2 wheels;Bedside commode      Prior Function Level of Independence: Independent               Hand Dominance        Extremity/Trunk Assessment   Upper Extremity Assessment Upper Extremity Assessment: Overall WFL for tasks assessed    Lower Extremity Assessment Lower Extremity Assessment: Overall WFL for tasks assessed    Cervical / Trunk Assessment Cervical / Trunk Assessment: Normal  Communication   Communication: No difficulties  Cognition Arousal/Alertness: Awake/alert Behavior During Therapy: WFL for tasks assessed/performed Overall Cognitive Status: Within Functional Limits for tasks assessed                                        General Comments      Exercises  Assessment/Plan    PT Assessment Patient needs continued PT services  PT Problem List Decreased strength;Decreased range of motion;Decreased activity tolerance;Decreased balance;Decreased mobility;Decreased coordination;Decreased knowledge of use of DME;Decreased safety awareness       PT Treatment Interventions DME instruction;Gait training;Stair training;Therapeutic activities;Functional mobility training;Therapeutic exercise;Balance  training;Neuromuscular re-education;Patient/family education    PT Goals (Current goals can be found in the Care Plan section)  Acute Rehab PT Goals Patient Stated Goal: to go home and get back to usual activiites PT Goal Formulation: With patient/family Time For Goal Achievement: 08/13/18 Potential to Achieve Goals: Good    Frequency Min 4X/week   Barriers to discharge Inaccessible home environment stairs to enter house    Co-evaluation               AM-PAC PT "6 Clicks" Daily Activity  Outcome Measure Difficulty turning over in bed (including adjusting bedclothes, sheets and blankets)?: A Little Difficulty moving from lying on back to sitting on the side of the bed? : A Little Difficulty sitting down on and standing up from a chair with arms (e.g., wheelchair, bedside commode, etc,.)?: A Little Help needed moving to and from a bed to chair (including a wheelchair)?: A Little Help needed walking in hospital room?: A Little Help needed climbing 3-5 steps with a railing? : A Little 6 Click Score: 18    End of Session Equipment Utilized During Treatment: Gait belt Activity Tolerance: Patient tolerated treatment well Patient left: in bed;with call bell/phone within reach;with family/visitor present Nurse Communication: Mobility status PT Visit Diagnosis: Unsteadiness on feet (R26.81);Muscle weakness (generalized) (M62.81)    Time: 5041-3643 PT Time Calculation (min) (ACUTE ONLY): 15 min   Charges:   PT Evaluation $PT Eval Low Complexity: 1 Low         Ramond Dial 07/30/2018, 10:21 PM  10:26 PM, 07/30/18 Mee Hives, PT, MS Physical Therapist - [place] [phone] (Office)

## 2018-07-30 NOTE — Progress Notes (Signed)
PROGRESS NOTE    Maria [name]  MOQ:[id number] DOB: 07/18/1956 DOA: 07/26/2018 PCP: Glenda Chroman, MD   Brief Narrative:  62 year old with past medical history relevant for [name] cirrhosis status post TI PS at [facility] in October 2019,GAVE/portal hypertensive gastropathy status post argon laser ablation, type 2 diabetes on insulin, upper lipidemia, hypothyroidism, depression/anxiety, hyperlipidemia who presents with altered mental status in the setting of hyperammonemia concerning for hepatic encephalopathy.  Assessment & Plan:   Principal Problem:   Hepatic encephalopathy (HCC) Active Problems:   Cirrhosis, nonalcoholic (HCC)   GERD (gastroesophageal reflux disease)   Hyperammonemia (HCC)   Hypothyroidism   Anemia  1. Hepatic encephalopathy-improving.  She is currently not at baseline, but this episode appears to be due to missing lactulose dosing and recent placement of TIPS.  Continue to monitor closely and serum ammonia is improving to 52.  Will recheck in a.m. 2. Multifactorial anemia.  This remained stable, but could also be hypovolemic.  Plan to hold torsemide for 2 days per GI recommendations. 3. Thrombocytopenia.  Likely secondary to splenic sequestration.  Maintain on SCDs and monitor. 4. Decompensated cirrhosis secondary to [place].  Patient is status post TIPS and embolization of 2 outflow veins.  Follows up at [facility].  Appreciate GI recommendations.  Continue to check daily weights. 5. Hypokalemia.  Oral potassium supplementation had been given and patient is on spironolactone.  Torsemide to be withheld for now.  Recheck labs in a.m. 6. Hypothyroidism.  Continue levothyroxine 25 mcg daily. 7. Dyslipidemia.  Continue Crestor 5 mg daily. 8. Depression/anxiety.  Continue buspirone and paroxetine as ordered.   DVT prophylaxis: SCDs Code Status: Full Family Communication: Husband at bedside Disposition Plan: Plan to hold torsemide for now and recheck labs in a.m.  Plans  per GI.   Consultants:   GI-Dr. Laural Golden  Procedures:   None  Antimicrobials:   None   Subjective: Patient seen and evaluated today with no new acute complaints or concerns. No acute concerns or events noted overnight.  She denies any abdominal symptoms and has had 3 bowel movements after Fleet enema yesterday afternoon.  She is hungry this morning and has not had much to eat yesterday.  Objective: Vitals:   07/29/18 1506 07/29/18 2245 07/30/18 0658 07/30/18 0824  BP: (!) 123/51 (!) 130/45 (!) 127/47   Pulse: 88 76 85   Resp: 20 18 18    Temp: 98 F (36.7 C) (!) 97.5 F (36.4 C) 97.8 F (36.6 C)   TempSrc:  Oral Oral   SpO2: 96% 98% 94% 95%  Weight:      Height:        Intake/Output Summary (Last 24 hours) at 07/30/2018 1242 Last data filed at 07/30/2018 0900 Gross per 24 hour  Intake 720 ml  Output 602 ml  Net 118 ml   Filed Weights   07/26/18 2223 07/28/18 0500 07/28/18 2240  Weight: 101.8 kg 81.6 kg 81.3 kg    Examination:  General exam: Appears calm and comfortable  Respiratory system: Clear to auscultation. Respiratory effort normal. Cardiovascular system: S1 & S2 heard, RRR. No JVD, murmurs, rubs, gallops or clicks. No pedal edema. Gastrointestinal system: Abdomen is nondistended, soft and nontender. No organomegaly or masses felt. Normal bowel sounds heard. Central nervous system: Alert and oriented. No focal neurological deficits. Extremities: Symmetric 5 x 5 power. Skin: No rashes, lesions or ulcers Psychiatry: Judgement and insight appear normal. Mood & affect appropriate.     Data Reviewed: I have personally  reviewed following labs and imaging studies  CBC: Recent Labs  Lab 07/26/18 2228 07/27/18 0406 07/28/18 0416 07/30/18 0413  WBC 3.7* 3.0* 3.5* 4.1  NEUTROABS 2.3 1.7  --   --   HGB 9.4* 8.2* 8.1* 9.1*  HCT 31.5* 27.4* 26.8* 29.9*  MCV 89.2 91.3 89.0 89.8  PLT 45* 43* 49* 48*   Basic Metabolic Panel: Recent Labs  Lab  07/26/18 2228 07/27/18 0406 07/28/18 0416 07/29/18 0450 07/30/18 0413  NA 137 140 138 137 137  K 3.6 3.4* 3.0* 3.4* 3.2*  CL 109 111 107 109 107  CO2 22 22 23 22 22   GLUCOSE 347* 286* 251* 240* 240*  BUN 11 11 10 12 12   CREATININE 0.74 0.65 0.76 0.68 0.73  CALCIUM 9.0 9.0 8.6* 8.3* 8.4*   GFR: Estimated Creatinine Clearance: 72.7 mL/min (by C-G formula based on SCr of 0.73 mg/dL). Liver Function Tests: Recent Labs  Lab 07/26/18 2228 07/27/18 0406 07/28/18 0416  AST 37 29 33  ALT 31 27 27   ALKPHOS 156* 128* 120  BILITOT 2.9* 2.8* 3.2*  PROT 6.6 5.6* 5.5*  ALBUMIN 2.8* 2.4* 2.3*   No results for input(s): LIPASE, AMYLASE in the last 168 hours. Recent Labs  Lab 07/26/18 2228 07/27/18 0405 07/28/18 1112 07/29/18 0845 07/30/18 0413  AMMONIA 135* 122* 64* 90* 52*   Coagulation Profile: Recent Labs  Lab 07/27/18 0455  INR 1.53   Cardiac Enzymes: No results for input(s): CKTOTAL, CKMB, CKMBINDEX, TROPONINI in the last 168 hours. BNP (last 3 results) No results for input(s): PROBNP in the last 8760 hours. HbA1C: No results for input(s): HGBA1C in the last 72 hours. CBG: Recent Labs  Lab 07/29/18 0810 07/29/18 1129 07/29/18 1704 07/30/18 0751 07/30/18 1130  GLUCAP 216* 327* 316* 194* 350*   Lipid Profile: No results for input(s): CHOL, HDL, LDLCALC, TRIG, CHOLHDL, LDLDIRECT in the last 72 hours. Thyroid Function Tests: No results for input(s): TSH, T4TOTAL, FREET4, T3FREE, THYROIDAB in the last 72 hours. Anemia Panel: No results for input(s): VITAMINB12, FOLATE, FERRITIN, TIBC, IRON, RETICCTPCT in the last 72 hours. Sepsis Labs: No results for input(s): PROCALCITON, LATICACIDVEN in the last 168 hours.  Recent Results (from the past 240 hour(s))  MRSA PCR Screening     Status: None   Collection Time: 07/27/18  2:37 AM  Result Value Ref Range Status   MRSA by PCR NEGATIVE NEGATIVE Final    Comment:        The GeneXpert MRSA Assay (FDA approved for  NASAL specimens only), is one component of a comprehensive MRSA colonization surveillance program. It is not intended to diagnose MRSA infection nor to guide or monitor treatment for MRSA infections. Performed at [facility], [address] [place], [place] [address]          Radiology Studies: No results found.      Scheduled Meds: . busPIRone  5 mg Oral BID  . insulin aspart  0-9 Units Subcutaneous TID WC  . insulin glargine  35 Units Subcutaneous q morning - 10a  . lactulose  30 g Oral TID  . levothyroxine  25 mcg Oral QAC breakfast  . magnesium oxide  400 mg Oral Daily  . pantoprazole  40 mg Oral Daily  . PARoxetine  40 mg Oral BH-q7a  . rifaximin  550 mg Oral TID  . rosuvastatin  5 mg Oral QODAY  . spironolactone  25 mg Oral Daily  . zinc sulfate  220 mg Oral Daily  Continuous Infusions:   LOS: 1 day    Time spent: 30 minutes    Zacheriah Stumpe Darleen Crocker, DO [facility] Pager [phone]  If 7PM-7AM, please contact night-coverage [email or web address] Password [facility] 07/30/2018, 12:42 PM

## 2018-07-30 NOTE — Progress Notes (Signed)
  Subjective:  Patient states she had 3 bowel movements after she was given Fleet Enema yesterday afternoon.  She feels better.  She denies nausea vomiting abdominal pain melena or rectal bleeding or dysuria.  She says she did not eat much suppertime yesterday which she is hungry this morning.  Objective: Blood pressure (!) 127/47, pulse 85, temperature 97.8 F (36.6 C), temperature source Oral, resp. rate 18, height 5' 3"  (1.6 m), weight 81.3 kg, SpO2 94 %. Patient is alert but her response to questions is slow. She knows she is had any pain hospital.  She does not know the date month or year. Asterixis absent. Cardiac exam with regular rhythm normal S1 and S2.  Systolic ejection murmur is unchanged.  It is best heard at aortic area and left upper sternal border. Lungs are clear to auscultation. Abdomen is symmetrical soft and nontender. No peripheral edema or clubbing noted.  Labs/studies Results:  Recent Labs    2018/07/30 0416 07/30/18 0413  WBC 3.5* 4.1  HGB 8.1* 9.1*  HCT 26.8* 29.9*  PLT 49* 48*    BMET  Recent Labs    30-Jul-2018 0416 07/29/18 0450 07/30/18 0413  NA 138 137 137  K 3.0* 3.4* 3.2*  CL 107 109 107  CO2 23 22 22   GLUCOSE 251* 240* 240*  BUN 10 12 12   CREATININE 0.76 0.68 0.73  CALCIUM 8.6* 8.3* 8.4*    LFT  Recent Labs    07/30/2018 0416  PROT 5.5*  ALBUMIN 2.3*  AST 33  ALT 27  ALKPHOS 120  BILITOT 3.2*    Serum ammonia is 52.  It was 81 yesterday.  Assessment:  #1.  Hepatic encephalopathy.  Both symptomatic and objective improvement.  She is not at her baseline.  This episode appears to be due to patient's missing lactulose dose and recent placement of TIPS.  #2.  Anemia.  She has multifactorial anemia.  Her hemoglobin has increased by 1 g since check yesterday morning.  I feel she may be on the dry side.  #3.  Decompensated cirrhosis secondary to NASH.  Status post TIPS and embolization of 2 outflow veins from large mesocaval shunt on  07/12/2018.  I contacted patient's transplant coordinator Ms. Neysa Bonito at [facility] and left message updating her on patient's condition.  Patient has multiple appointments at [facility] next week.  #4.  Hypokalemia.  Serum potassium has decreased since yesterday.  She is on oral KCl.  Patient is also on spironolactone.  She may be on the dry side.   Recommendations:  Hold torsemide for 2 days. Metabolic 7 in a.m. Daily weights.

## 2018-07-30 NOTE — Unmapped (Signed)
Return Call to [employer] spouse - VM left on home #, unable to leave Vm on cell.    Return call to Dr. Karilyn Cota - reports patient admitted to local hospital with acute HE but doing better now ( likely missed a dose or two of lactulose d/t medical appt's). RX sent to [facility] specialty pharmacy for Rifaxamin per MD request.

## 2018-07-31 DIAGNOSIS — E039 Hypothyroidism, unspecified: Secondary | ICD-10-CM

## 2018-07-31 DIAGNOSIS — E7220 Disorder of urea cycle metabolism, unspecified: Secondary | ICD-10-CM

## 2018-07-31 DIAGNOSIS — K219 Gastro-esophageal reflux disease without esophagitis: Secondary | ICD-10-CM

## 2018-07-31 DIAGNOSIS — K7200 Acute and subacute hepatic failure without coma: Principal | ICD-10-CM

## 2018-07-31 DIAGNOSIS — K7460 Unspecified cirrhosis of liver: Secondary | ICD-10-CM

## 2018-07-31 DIAGNOSIS — E1165 Type 2 diabetes mellitus with hyperglycemia: Secondary | ICD-10-CM

## 2018-07-31 DIAGNOSIS — Z794 Long term (current) use of insulin: Secondary | ICD-10-CM

## 2018-07-31 LAB — COMPREHENSIVE METABOLIC PANEL
ALT: 33 U/L (ref 0–44)
AST: 45 U/L — ABNORMAL HIGH (ref 15–41)
Albumin: 2.4 g/dL — ABNORMAL LOW (ref 3.5–5.0)
Alkaline Phosphatase: 135 U/L — ABNORMAL HIGH (ref 38–126)
Anion gap: 6 (ref 5–15)
BUN: 9 mg/dL (ref 8–23)
CO2: 21 mmol/L — ABNORMAL LOW (ref 22–32)
Calcium: 8.3 mg/dL — ABNORMAL LOW (ref 8.9–10.3)
Chloride: 108 mmol/L (ref 98–111)
Creatinine, Ser: 0.56 mg/dL (ref 0.44–1.00)
GFR calc Af Amer: >60 mL/min (ref >60)
GFR calc non Af Amer: >60 mL/min (ref >60)
Glucose, Bld: 282 mg/dL — ABNORMAL HIGH (ref 70–99)
Potassium: 3.7 mmol/L (ref 3.5–5.1)
Sodium: 135 mmol/L (ref 135–145)
Total Bilirubin: 3 mg/dL — ABNORMAL HIGH (ref 0.3–1.2)
Total Protein: 5.6 g/dL — ABNORMAL LOW (ref 6.5–8.1)

## 2018-07-31 LAB — URINALYSIS, ROUTINE W REFLEX MICROSCOPIC
Bilirubin Urine: NEGATIVE
Glucose, UA: >=500 mg/dL — AB
Ketones, ur: NEGATIVE mg/dL
Leukocytes, UA: NEGATIVE
Nitrite: NEGATIVE
Protein, ur: NEGATIVE mg/dL
Specific Gravity, Urine: 1.008 (ref 1.005–1.030)
pH: 6 (ref 5.0–8.0)

## 2018-07-31 LAB — CBC
HCT: 26.4 % — ABNORMAL LOW (ref 36.0–46.0)
Hemoglobin: 8.5 g/dL — ABNORMAL LOW (ref 12.0–15.0)
MCH: 28.4 pg (ref 26.0–34.0)
MCHC: 32.2 g/dL (ref 30.0–36.0)
MCV: 88.3 fL (ref 80.0–100.0)
Platelets: 52 10*3/uL — ABNORMAL LOW (ref 150–400)
RBC: 2.99 MIL/uL — ABNORMAL LOW (ref 3.87–5.11)
RDW: 23.1 % — ABNORMAL HIGH (ref 11.5–15.5)
WBC: 4.1 10*3/uL (ref 4.0–10.5)
nRBC: 0 % (ref 0.0–0.2)

## 2018-07-31 LAB — PROTIME-INR
INR: 1.56
Prothrombin Time: 18.5 seconds — ABNORMAL HIGH (ref 11.4–15.2)

## 2018-07-31 LAB — GLUCOSE, CAPILLARY
Glucose-Capillary: 257 mg/dL — ABNORMAL HIGH (ref 70–99)
Glucose-Capillary: 270 mg/dL — ABNORMAL HIGH (ref 70–99)
Glucose-Capillary: 339 mg/dL — ABNORMAL HIGH (ref 70–99)

## 2018-07-31 LAB — AMMONIA: Ammonia: 69 umol/L — ABNORMAL HIGH (ref 9–35)

## 2018-07-31 MED ORDER — RIFAXIMIN 550 MG PO TABS: 550.0000 mg | ORAL_TABLET | Freq: Three times a day (TID) | ORAL | 1 refills | Status: DC

## 2018-07-31 MED ORDER — SPIRONOLACTONE 50 MG PO TABS: 25.0000 mg | ORAL_TABLET | Freq: Two times a day (BID) | ORAL | Status: DC

## 2018-07-31 MED ORDER — INSULIN GLARGINE 100 UNIT/ML ~~LOC~~ SOLN
38.0000 [IU] | Freq: Every morning | SUBCUTANEOUS | Status: DC
  Administered 2018-07-31: 38 [IU] via SUBCUTANEOUS
  Filled 2018-07-31 (×2): qty 0.38

## 2018-07-31 MED ORDER — TORSEMIDE 20 MG PO TABS: 20.0000 mg | ORAL_TABLET | Freq: Every day | ORAL | 3 refills | Status: DC

## 2018-07-31 MED ORDER — INSULIN GLARGINE 100 UNIT/ML SOLOSTAR PEN: 40.0000 [IU] | PEN_INJECTOR | Freq: Every day | SUBCUTANEOUS | Status: DC

## 2018-07-31 MED ORDER — ZINC SULFATE 220 (50 ZN) MG PO CAPS: 220.0000 mg | ORAL_CAPSULE | Freq: Every day | ORAL | 1 refills | Status: AC

## 2018-07-31 NOTE — Progress Notes (Signed)
Patient ID: Maria [name], female   DOB: 09/14/1956, 62 y.o.   MRN: [id number]  Alert and oriented. Ammonia slightly elevated than yesterday. Had a BM during the night. C/o burning on urination this am. Slight drop in her hemoglobin. No melena. Blood pressure (!) 125/51, pulse 91, temperature 97.6 F (36.4 C), temperature source Oral, resp. rate 18, height 5' 3"  (1.6 m), weight 76.3 kg, SpO2 98 %.  CBC    Component Value Date/Time   WBC 4.1 07/31/2018 0550   RBC 2.99 (L) 07/31/2018 0550   HGB 8.5 (L) 07/31/2018 0550   HCT 26.4 (L) 07/31/2018 0550   PLT 52 (L) 07/31/2018 0550   MCV 88.3 07/31/2018 0550   MCH 28.4 07/31/2018 0550   MCHC 32.2 07/31/2018 0550   RDW 23.1 (H) 07/31/2018 0550   LYMPHSABS 0.8 07/27/2018 0406   MONOABS 0.3 07/27/2018 0406   EOSABS 0.1 07/27/2018 0406   BASOSABS 0.0 07/27/2018 0406  Hepatic encephalopathy: Ammonia slightly elevated. Continue the Lactulose. Urinary frequency: Urinalysis.

## 2018-07-31 NOTE — Care Management (Signed)
Pt from home with husband. ind at baseline. Active with [facility] pta. Husband would like to continue services. Vaughan Basta, [facility] rep, aware of admission and will be notified when pt leaves. Pt will need resumption order. Husband aware HH has 48 hrs to make resumption visit.

## 2018-07-31 NOTE — Progress Notes (Signed)
Inpatient Diabetes Program Recommendations  AACE/ADA: New Consensus Statement on Inpatient Glycemic Control (2015)  Target Ranges:  Prepandial:   less than 140 mg/dL      Peak postprandial:   less than 180 mg/dL (1-2 hours)      Critically ill patients:  140 - 180 mg/dL   Results for Maria, [name] (MRN [id number]) as of 07/31/2018 14:24  Ref. Range 07/30/2018 07:51 07/30/2018 11:30 07/30/2018 16:29 07/30/2018 21:48  Glucose-Capillary Latest Ref Range: 70 - 99 mg/dL 194 (H)  2 units NOVOLOG  350 (H)  7 units NOVOLOG +  35 units LANTUS  397 (H)  9 units NOVOLOG 264 (H)   Results for Maria, [name] (MRN [id number]) as of 07/31/2018 14:24  Ref. Range 07/31/2018 08:04 07/31/2018 11:36  Glucose-Capillary Latest Ref Range: 70 - 99 mg/dL 257 (H)  5 units NOVOLOG  270 (H)  5 units NOVOLOG +  38 units LANTUS    Home DM Meds: Humalog 3-12 units TID                             Lantus 35 units QAM  Current Orders: Lantus 38 units QAM                            Novolog Sensitive Correction Scale/ SSI (0-9 units) TID AC      Note Lantus dose increased today.  Still having issues with post-meal CBGs.  Eating 75-100% of meals.     MD- Please also consider Starting Novolog Meal Coverage:   Novolog 6 units TID with meals  (Please add the following Hold Parameters: Hold if pt eats <50% of meal, Hold if pt NPO)     --Will follow patient during hospitalization--  Wyn Quaker RN, MSN, CDE Diabetes Coordinator Inpatient Glycemic Control Team Team Pager: [phone] (8a-5p)

## 2018-07-31 NOTE — Progress Notes (Signed)
Physical Therapy Treatment Patient Details Name: Maria [name] MRN: [id number] DOB: Aug 11, 1956 Today's Date: 07/31/2018    History of Present Illness 62 yo female with onset of hepatic encephalopathy and decompensated NASh cirrhosis, low K+, hyperammonia from no meds.  PMHx:  TIPS procedure [facility], CHF, DM, HTN, OA, UTI, anemia,     PT Comments    Pt supine in bed and willing to participate with therapy.  Pt independent with bed mobility and transfer sit to stand just increased time to complete.  Increased distance with gait training with no AD required just supervision.  Pt ambulated slow and labored with cueing to increase stride length for more normalized gait mechanics and energy conversation, no LOB through session.  Pt ambulate slow and somewhat cautiously.  EOS pt reports she has intermittent LBP pain scale 4/10, RN notified.  Pt left in bed per request of fatigue with call bell within reach.    Follow Up Recommendations  Home health PT;Supervision for mobility/OOB     Equipment Recommendations       Recommendations for Other Services       Precautions / Restrictions Precautions Precautions: Fall Restrictions Weight Bearing Restrictions: No    Mobility  Bed Mobility Overal bed mobility: Independent                Transfers Overall transfer level: Independent   Transfers: Sit to/from Stand Sit to Stand: Supervision         General transfer comment: Pt slow but independent with bed mobilty and sit to stand from bed   Ambulation/Gait Ambulation/Gait assistance: Supervision Gait Distance (Feet): 500 Feet Assistive device: None Gait Pattern/deviations: Step-through pattern;Decreased stride length;Narrow base of support Gait velocity: reduced   General Gait Details: slow paced gait with careful maneuvering   Stairs             Wheelchair Mobility    Modified Rankin (Stroke Patients Only)       Balance                                            Cognition Arousal/Alertness: Awake/alert Behavior During Therapy: WFL for tasks assessed/performed Overall Cognitive Status: Within Functional Limits for tasks assessed                                        Exercises      General Comments        Pertinent Vitals/Pain Pain Assessment: 0-10 Pain Score: 4  Pain Location: LBP Pain Descriptors / Indicators: Aching Pain Intervention(s): Limited activity within patient's tolerance;Monitored during session(RN notified of pain, pt did not request any medication to assist)    Home Living                      Prior Function            PT Goals (current goals can now be found in the care plan section)      Frequency    Min 4X/week      PT Plan Current plan remains appropriate    Co-evaluation              AM-PAC PT "6 Clicks" Daily Activity  Outcome Measure  Difficulty turning over in bed (including adjusting bedclothes, sheets  and blankets)?: None Difficulty moving from lying on back to sitting on the side of the bed? : None Difficulty sitting down on and standing up from a chair with arms (e.g., wheelchair, bedside commode, etc,.)?: A Little Help needed moving to and from a bed to chair (including a wheelchair)?: A Little Help needed walking in hospital room?: None Help needed climbing 3-5 steps with a railing? : A Little 6 Click Score: 21    End of Session Equipment Utilized During Treatment: Gait belt Activity Tolerance: Patient tolerated treatment well Patient left: in bed;with call bell/phone within reach;with family/visitor present Nurse Communication: Mobility status PT Visit Diagnosis: Unsteadiness on feet (R26.81);Muscle weakness (generalized) (M62.81)     Time: 0511-0211 PT Time Calculation (min) (ACUTE ONLY): 16 min  Charges:  $Therapeutic Activity: 8-22 mins                     [address], LPTA; CBIS [phone]  Aldona Lento 07/31/2018, 2:53 PM

## 2018-07-31 NOTE — Progress Notes (Signed)
Patient removed her IV catheter. Catheter site clean dry and intact. Discharge instructions including medications and follow up appointments were reviewed and discussed with patient. All questions were answered and no further questions at this time. Pt in stable condition and in no acute distress at time of discharge. Pt escorted by nurse tech

## 2018-07-31 NOTE — Discharge Summary (Signed)
Physician Discharge Summary  Maria [name] GNF:[id number] DOB: 1956/07/11 DOA: 07/26/2018  PCP: Glenda Chroman, MD  Admit date: 07/26/2018 Discharge date: 07/31/2018  Time spent: 35 minutes  Recommendations for Outpatient Follow-up:  1. Repeat CBC to follow hemoglobin and platelets trend 2. Repeat CMET to follow electrolytes and LFTs. 3. Reasses patient's CBGs and adjust hypoglycemic regimen as needed.   Discharge Diagnoses:  Principal Problem:   Acute hepatic encephalopathy Active Problems:   Cirrhosis, nonalcoholic (HCC)   GERD (gastroesophageal reflux disease)   Hyperammonemia (HCC)   Hypothyroidism   Anemia   Discharge Condition: Stable and improved.  Patient discharged home with instruction to follow-up with gastroenterology service and PCP as instructed.  Diet recommendation: Low-sodium diet and modify carbohydrates.  Filed Weights   07/28/18 2240 07/30/18 1415 07/31/18 0600  Weight: 81.3 kg 81 kg 76.3 kg    History of present illness:  As per H&P written by Dr. Olevia Bowens on 07/27/2018  62 y.o. female with medical history significant of anemia, CHF, cirrhosis, depression, diabetes mellitus, esophageal bleed (non-variceal), fibromyalgia, hypercholesterolemia, hypertension, hypothyroidism, NAFLD (nonalcoholic fatty liver disease), osteoarthrosis, osteoporosis, PONV, UTI who is brought to the emergency department by her spouse due to increased confusion since earlier today, which is typical of her episodes of hyperammonemia.  She had an episode of emesis witnessed by her husband earlier in the evening and her mental status seemed to have improved after that.  She denies fever, chills, dietary indiscretions, cough, chest pain, PND, orthopnea, but she gets occasional pitting edema of the lower extremities.  No abdominal pain, constipation, melena or hematochezia.  Dysuria, frequency or hematuria.  No heat or cold intolerance..  ED Course: BP 153/63 (BP Location: Left Arm)  Pulse  96  Temp 98.2 F (36.8 C) (Oral)  Resp (!) 23  Ht 5' 2"  (1.575 m)  Wt 101.8 kg  SpO2 93%  BMI 41.05 kg/m    White count is 3.7, hemoglobin 9.4 g/dL and platelets 45.  Her CMP showed a glucose of 347 and a total bilirubin of 2.9 mg/dL.  Total protein was 2.8 g/dL and alkaline phosphatase is 156 units/L.  Her ammonia level was 135 mol/L .  Hospital Course:  1-hepatic encephalopathy: Appears to be secondary to missed syncopal doses of lactulose and recent placement of TIPS. -Mentation improved from back to baseline at the time of discharge (patient oriented x3 and with good insight of acute problems). -Patient was educated about the importance of medication compliance and appropriate hydration. -Outpatient follow-up with gastroenterology and liver transplant unit as previously scheduled and instructed. -Started on rifaximin at discharge.  2-anemia: Negative occult blood test -No signs of overt bleeding -Hemoglobin has remained stable overall. -Repeat CBC to follow hemoglobin trend -Continue avoiding NSAIDs. -Continue PPI.  3-thrombocytopenia: Associated with a splenic sequestration and ongoing cirrhosis -No signs of acute bleeding -Repeat CBC as an outpatient to follow platelets trend.  4-NASH/Cirrhosis -Patient is status post recent TIPS and embolization of 2 outflow veins -Continue outpatient follow-up at [facility] -Patient currently on the transplant list -Continue the use of lactulose and rifaximin as instructed by gastroenterology. -Patient torsemide and spironolactone has been placed on hold at time of discharge due to ongoing hypokalemia and concerns for mild dehydration. -Advised to follow low-sodium diet, to check weight on daily basis and to follow-up further instructions by GI service.  5-hypokalemia -Repleted and within normal limits -Continue daily supplementation.  6-hypothyroidism -Continue levothyroxine.  7-dyslipidemia -Continue current  statins therapy.  8-depression/anxiety -  Continue buspirone and paroxetine -No suicidal ideation or hallucinations.  9-type 2 diabetes mellitus with hyperglycemia: Patient chronically on insulin therapy -Will continue the use of sliding scale insulin and Lantus (last 1 adjusted to 40 units daily for better control of her CBGs) -CBGs remain consistently in the 250-270 range -Modified carbohydrates diet has been encouraged.  10-history of coronary artery disease -Continue statins and continue beta-blocker -Patient denies chest pain.  11-pulmonary hypertension -Blood pressure stable -There is no signs of crackles or significant fluid overload currently -Plan is to resume in the near future torsemide and spironolactone. -Low-sodium diet has been encouraged -Patient instructed to follow daily weights.  Procedures:  None  Consultations:  Gastroenterology service  Discharge Exam: Vitals:   07/31/18 0603 07/31/18 1414  BP: (!) 125/51 (!) 140/44  Pulse: 91 84  Resp: 18   Temp: 97.6 F (36.4 C) 97.8 F (36.6 C)  SpO2: 98% 100%    General: Afebrile, alert, awake and oriented x3; denies chest pain, nausea, vomiting and abdominal pain.  Cardiovascular: S1 and S2, no rubs, no gallops, no murmurs. Respiratory: Clear to auscultation bilaterally, no using accessory muscles. Abdomen: Soft, positive bowel sounds, no distention. Extremities: No cyanosis, no clubbing; trace edema bilaterally.   Discharge Instructions   Discharge Instructions    Diet - low sodium heart healthy   Complete by:  As directed    Discharge instructions   Complete by:  As directed    Keep yourself well-hydrated Take medications as prescribed Watch amount of sodium in your diet Follow-up with PCP and gastroenterologist as instructed. Check your weight on daily basis.     Allergies as of 07/31/2018      Reactions   Ferumoxytol Other (See Comments)   Patient has severe back and chest pain when  receiving FERAHEME infusions. Patient has severe back and chest pain when receiving FERAHEME infusions.   Tramadol Hcl Other (See Comments)   WEAKNESS WEAKNESS      Medication List    TAKE these medications   busPIRone 5 MG tablet Commonly known as:  BUSPAR Take 5 mg by mouth 2 (two) times daily.   cholecalciferol 1000 units tablet Commonly known as:  VITAMIN D Take 1,000 Units by mouth 3 (three) times a week.   HUMALOG 100 UNIT/ML injection Generic drug:  insulin lispro Inject 3-12 Units into the skin 3 (three) times daily with meals.   Insulin Glargine 100 UNIT/ML Solostar Pen Commonly known as:  LANTUS Inject 40 Units into the skin daily at 10 pm. What changed:  how much to take   Insulin Pen Needle 31G X 5 MM Misc Use with each insulin injection   lactulose 10 GM/15ML solution Commonly known as:  CHRONULAC TAKE 30 MLS BY MOUTH FOUR TIMES DAILY What changed:  See the new instructions.   levothyroxine 25 MCG tablet Commonly known as:  SYNTHROID, LEVOTHROID Take 25 mcg by mouth daily before breakfast.   magnesium oxide 400 MG tablet Commonly known as:  MAG-OX Take 400 mg by mouth daily.   multivitamin Chew chewable tablet Chew 1 tablet by mouth daily.   OXYGEN Inhale 2 L into the lungs daily as needed (FOR BREATHING).   pantoprazole 40 MG tablet Commonly known as:  PROTONIX Take 1 tablet (40 mg total) by mouth 2 (two) times daily. What changed:  when to take this   PARoxetine 40 MG tablet Commonly known as:  PAXIL Take 40 mg by mouth every morning.   potassium chloride 10 MEQ  tablet Commonly known as:  K-DUR Take 10 mEq by mouth 2 (two) times daily.   rifaximin 550 MG Tabs tablet Commonly known as:  XIFAXAN Take 1 tablet (550 mg total) by mouth 3 (three) times daily.   rosuvastatin 5 MG tablet Commonly known as:  CRESTOR Take 1 tablet (5 mg total) by mouth every other day.   spironolactone 50 MG tablet Commonly known as:  ALDACTONE Take 0.5  tablets (25 mg total) by mouth 2 (two) times daily. Hold it until further instructions by Dr. Laural Golden What changed:  additional instructions   torsemide 20 MG tablet Commonly known as:  DEMADEX Take 1 tablet (20 mg total) by mouth daily. Hold until further instructions by Dr. Laural Golden What changed:  additional instructions   trimethoprim-polymyxin b ophthalmic solution Commonly known as:  POLYTRIM Place 1 drop into the left eye See admin instructions. Use 3 to 4 times daily for 2 days following monthly eye injection   zinc sulfate 220 (50 Zn) MG capsule Take 1 capsule (220 mg total) by mouth daily. Start taking on:  08/01/2018      Allergies  Allergen Reactions  . Ferumoxytol Other (See Comments)    Patient has severe back and chest pain when receiving FERAHEME infusions. Patient has severe back and chest pain when receiving FERAHEME infusions.  . Tramadol Hcl Other (See Comments)    WEAKNESS WEAKNESS   Follow-up Information    [name], Dhruv B, MD. Schedule an appointment as soon as possible for a visit in 10 day(s).   Specialty:  Internal Medicine Contact information: [place] [address] [phone]            The results of significant diagnostics from this hospitalization (including imaging, microbiology, ancillary and laboratory) are listed below for reference.    Significant Diagnostic Studies: No results found.  Microbiology: Recent Results (from the past 240 hour(s))  MRSA PCR Screening     Status: None   Collection Time: 07/27/18  2:37 AM  Result Value Ref Range Status   MRSA by PCR NEGATIVE NEGATIVE Final    Comment:        The GeneXpert MRSA Assay (FDA approved for NASAL specimens only), is one component of a comprehensive MRSA colonization surveillance program. It is not intended to diagnose MRSA infection nor to guide or monitor treatment for MRSA infections. Performed at [facility], [address] [place],  [address]       Labs: Basic Metabolic Panel: Recent Labs  Lab 07/27/18 0406 07/28/18 0416 07/29/18 0450 07/30/18 0413 07/31/18 0550  NA 140 138 137 137 135  K 3.4* 3.0* 3.4* 3.2* 3.7  CL 111 107 109 107 108  CO2 22 23 22 22  21*  GLUCOSE 286* 251* 240* 240* 282*  BUN 11 10 12 12 9   CREATININE 0.65 0.76 0.68 0.73 0.56  CALCIUM 9.0 8.6* 8.3* 8.4* 8.3*   Liver Function Tests: Recent Labs  Lab 07/26/18 2228 07/27/18 0406 07/28/18 0416 07/31/18 0550  AST 37 29 33 45*  ALT 31 27 27  33  ALKPHOS 156* 128* 120 135*  BILITOT 2.9* 2.8* 3.2* 3.0*  PROT 6.6 5.6* 5.5* 5.6*  ALBUMIN 2.8* 2.4* 2.3* 2.4*   Recent Labs  Lab 07/27/18 0405 07/28/18 1112 07/29/18 0845 07/30/18 0413 07/31/18 0550  AMMONIA 122* 64* 90* 52* 69*   CBC: Recent Labs  Lab 07/26/18 2228 07/27/18 0406 07/28/18 0416 07/30/18 0413 07/31/18 0550  WBC 3.7* 3.0* 3.5* 4.1 4.1  NEUTROABS  2.3 1.7  --   --   --   HGB 9.4* 8.2* 8.1* 9.1* 8.5*  HCT 31.5* 27.4* 26.8* 29.9* 26.4*  MCV 89.2 91.3 89.0 89.8 88.3  PLT 45* 43* 49* 48* 52*   BNP: BNP (last 3 results) Recent Labs    07/02/18 1736  BNP 20.0   CBG: Recent Labs  Lab 07/30/18 1629 07/30/18 2148 07/31/18 0804 07/31/18 1136 07/31/18 1712  GLUCAP 397* 264* 257* 270* 339*    Signed:  Barton Dubois MD.  [facility] 07/31/2018, 5:49 PM

## 2018-08-05 ENCOUNTER — Other Ambulatory Visit: Payer: Self-pay

## 2018-08-05 NOTE — Patient Outreach (Signed)
[place] [facility]) Care Management  [place]  08/05/2018  Maria [name] [birth year] [id number]   62 year old femal outreached by [place] services for a 30 day post discharge medication review.  PMHx includes, but not limited to, diastolic heart failure, coronary artery disease, esophageal varices, cirrhosis, pulmonary hypertension, GERD, diabetes mellitus and hypothyroidism.   Unsuccessful telephone call attempt # 1 to [name].  Left HIPAA compliant voice message requesting a return call.   Plan:  I will make another outreach attempt to patient within 3-4 business days.  Joetta Manners, PharmD Clinical Pharmacist [place] [phone]

## 2018-08-06 DIAGNOSIS — D500 Iron deficiency anemia secondary to blood loss (chronic): Secondary | ICD-10-CM | POA: Diagnosis not present

## 2018-08-06 DIAGNOSIS — E119 Type 2 diabetes mellitus without complications: Secondary | ICD-10-CM | POA: Diagnosis not present

## 2018-08-06 DIAGNOSIS — K700 Alcoholic fatty liver: Secondary | ICD-10-CM | POA: Diagnosis not present

## 2018-08-06 DIAGNOSIS — Z48815 Encounter for surgical aftercare following surgery on the digestive system: Secondary | ICD-10-CM | POA: Diagnosis not present

## 2018-08-08 ENCOUNTER — Other Ambulatory Visit: Payer: Self-pay

## 2018-08-08 ENCOUNTER — Ambulatory Visit
Admit: 2018-08-08 | Discharge: 2018-08-09 | Payer: PRIVATE HEALTH INSURANCE | Attending: Student in an Organized Health Care Education/Training Program | Primary: Student in an Organized Health Care Education/Training Program

## 2018-08-08 ENCOUNTER — Ambulatory Visit: Admit: 2018-08-08 | Discharge: 2018-08-09 | Payer: PRIVATE HEALTH INSURANCE

## 2018-08-08 ENCOUNTER — Ambulatory Visit
Admit: 2018-08-08 | Discharge: 2018-08-09 | Payer: PRIVATE HEALTH INSURANCE | Attending: Diagnostic Radiology | Primary: Diagnostic Radiology

## 2018-08-08 ENCOUNTER — Ambulatory Visit
Admit: 2018-08-08 | Discharge: 2018-08-09 | Payer: PRIVATE HEALTH INSURANCE | Attending: Gastroenterology | Primary: Gastroenterology

## 2018-08-08 DIAGNOSIS — K7460 Unspecified cirrhosis of liver: Principal | ICD-10-CM

## 2018-08-08 DIAGNOSIS — K760 Fatty (change of) liver, not elsewhere classified: Principal | ICD-10-CM

## 2018-08-08 DIAGNOSIS — K7290 Hepatic failure, unspecified without coma: Principal | ICD-10-CM

## 2018-08-08 DIAGNOSIS — Z95828 Presence of other vascular implants and grafts: Secondary | ICD-10-CM

## 2018-08-08 DIAGNOSIS — I81 Portal vein thrombosis: Secondary | ICD-10-CM | POA: Diagnosis not present

## 2018-08-08 LAB — CBC W/ AUTO DIFF
BASOPHILS ABSOLUTE COUNT: 0 10*9/L (ref 0.0–0.1)
BASOPHILS RELATIVE PERCENT: 1.1 %
EOSINOPHILS ABSOLUTE COUNT: 0.1 10*9/L (ref 0.0–0.4)
EOSINOPHILS RELATIVE PERCENT: 2.1 %
HEMATOCRIT: 31.7 % — ABNORMAL LOW (ref 36.0–46.0)
HEMOGLOBIN: 9.6 g/dL — ABNORMAL LOW (ref 12.0–16.0)
LARGE UNSTAINED CELLS: 5 % — ABNORMAL HIGH (ref 0–4)
LYMPHOCYTES ABSOLUTE COUNT: 0.7 10*9/L — ABNORMAL LOW (ref 1.5–5.0)
LYMPHOCYTES RELATIVE PERCENT: 21.5 %
MEAN CORPUSCULAR HEMOGLOBIN CONC: 30.4 g/dL — ABNORMAL LOW (ref 31.0–37.0)
MEAN CORPUSCULAR HEMOGLOBIN: 27.9 pg (ref 26.0–34.0)
MEAN CORPUSCULAR VOLUME: 91.8 fL (ref 80.0–100.0)
NEUTROPHILS ABSOLUTE COUNT: 2.2 10*9/L (ref 2.0–7.5)
NEUTROPHILS RELATIVE PERCENT: 64.3 %
PLATELET COUNT: 42 10*9/L — ABNORMAL LOW (ref 150–440)
RED BLOOD CELL COUNT: 3.45 10*12/L — ABNORMAL LOW (ref 4.00–5.20)
WBC ADJUSTED: 3.5 10*9/L — ABNORMAL LOW (ref 4.5–11.0)

## 2018-08-08 LAB — HEPATIC FUNCTION PANEL
ALBUMIN: 2.8 g/dL — ABNORMAL LOW (ref 3.5–5.0)
AST (SGOT): 45 U/L — ABNORMAL HIGH (ref 14–38)
BILIRUBIN DIRECT: 0.8 mg/dL — ABNORMAL HIGH (ref 0.00–0.40)
BILIRUBIN TOTAL: 3.8 mg/dL — ABNORMAL HIGH (ref 0.0–1.2)

## 2018-08-08 LAB — ALT (SGPT): Alanine aminotransferase:CCnc:Pt:Ser/Plas:Qn:: 36 — ABNORMAL HIGH

## 2018-08-08 LAB — HEMOGLOBIN: Lab: 9.6 — ABNORMAL LOW

## 2018-08-08 LAB — PROTIME-INR: PROTIME: 17.4 s — ABNORMAL HIGH (ref 10.2–13.1)

## 2018-08-08 LAB — SODIUM: Sodium:SCnc:Pt:Ser/Plas:Qn:: 142

## 2018-08-08 LAB — SMEAR REVIEW

## 2018-08-08 LAB — CREATININE
CREATININE: 0.65 mg/dL (ref 0.60–1.00)
EGFR CKD-EPI NON-AA FEMALE: >90 mL/min/{1.73_m2} (ref >=60)

## 2018-08-08 LAB — EGFR CKD-EPI AA FEMALE: Lab: >90

## 2018-08-08 LAB — POTASSIUM: Potassium:SCnc:Pt:Ser/Plas:Qn:: 4.2

## 2018-08-08 LAB — INR: Lab: 1.5

## 2018-08-08 NOTE — Patient Outreach (Signed)
[place] [facility]) Care Management  [place]  08/08/2018  Maria [name] 04-27-1956 [id number]  62 year old femal outreached by [place] services for a 30 day post discharge medication review.  PMHx includes, but not limited to, diastolic heart failure, coronary artery disease, esophageal varices, cirrhosis, pulmonary hypertension, GERD, diabetes mellitus and hypothyroidism.   Unsuccessful telephone call attempt # 2 to [name].  Left HIPAA compliant voice message requesting a return call.   Plan:  I will make another outreach attempt to patient within 3-4 business days.  Joetta Manners, PharmD Clinical Pharmacist [place] [phone]

## 2018-08-08 NOTE — Unmapped (Signed)
[facility], [facility], [address]  [place], [place]  [address]  Ph: [phone]  Fax: [phone]    08/09/2018    Patient Care Team:  Ignatius Specking, MD as PCP - General  Carollee Massed, MD as Referring Physician  Collie Siad, MD as Transplant Hepatologist (Gastroenterology)  Janet Berlin, RN as Transplant Coordinator (Transplant)  Coralee Rud, MSW as Social Worker (Transplant)  Lavenia Atlas, PsyD as Transplant Psychologist (Transplant)  Asheton Prudencio Burly as Transplant Financial Coordinator (Transplant)  Jenel Lucks, MD as Resident (Psychiatry)    RE: Cinzia Devos; DOB: 1955/11/01      Reason for visit: Follow-up for cirrhosis secondary to nonalcoholic fatty liver disease    HPI: Ms. Jiles Prows is a pleasant 62 year old Caucasian female with history of cirrhosis secondary to NAFLD.  She is currently undergoing transplant evaluation.  She was last seen hepatology clinic on 07/02/2018.  At that clinic visit patient's hemoglobin was 6.4.  She was hospitalized locally for blood transfusion.  Her most recent meld score is 18.  Blood group O-.  Complications of her liver disease include variceal bleed, ascites and hepatic encephalopathy.  Patient states that she underwent an EGD locally in 12/2017.  She does have a history of GAVE as well and did require APC per patient report.  The official report is not available for review.  In regards to encephalopathy she is currently on lactulose monotherapy.  She is achieving 3???5 bowel movements a day.  Her ascites is currently being managed with torsemide 20 mg daily and spironolactone 50 mg daily.  She has not required any LVP's.  She did undergo a right heart catheterization in 01/2018 at [facility] which showed no evidence for pulmonary hypertension.  She is also seen [facility] pulmonary.  She underwent a DEXA scan on 12/25/2017.  An MRI from 12/25/2017 showed no evidence for [facility].  There is questionable nonocclusive portal vein thrombus.  Since her last clinic visit patient did undergo a repeat MRI and CT on 03/28/2018.  Her MRI was reviewed at [facility] conference and felt the PVT persists and is concerning for progression, worsening portal htn and compromising her transplant candidacy by loss of PV and SMV.  Anti-coagulation is not a good option due to her chronic PHG/GAVE blood loss.  We recommended proceeding with a TIPS.  She underwent a TIPS on 07/08/2018.  She also underwent embolization of mesocaval varices.  Her hepatic venous pressure gradient went from 10 to 2 mm Hg.  Since her TIPS she was hospitalized for hepatic encephalopathy on 07/26/2018 and now started on Xifaxan.  Patient has been having some difficulty in obtaining due to its cost.  She is scheduled to see transplant surgery today as well as get ultrasound.  She does have a history of hypertension and hyperlipidemia. She is immune to hepatitis A and B.      Today in clinic she overall feels well denies any fever, chills, headache, jaundice, chest pain, upper or lower GI bleeding, melena or confusion.      PMH:  Patient Active Problem List   Diagnosis   ??? Abnormal echocardiogram   ??? NAFLD (nonalcoholic fatty liver disease)   ??? Cirrhosis (CMS-HCC)   ??? GAVE (gastric antral vascular ectasia)   ??? Hypothyroid   ??? Chronic blood loss anemia     Past Medical History:   Diagnosis Date   ??? Cirrhosis (CMS-HCC)    ??? Depression    ???  Diabetes mellitus (CMS-HCC)    ??? Fibromyalgia    ??? GAVE (gastric antral vascular ectasia)    ??? HTN (hypertension)    ??? Hypercholesterolemia    ??? Hypothyroid    ??? NAFLD (nonalcoholic fatty liver disease)    ??? Osteoporosis        PSH:  Past Surgical History:   Procedure Laterality Date   ??? APPENDECTOMY     ??? CHOLECYSTECTOMY     ??? IR TIPS  07/08/2018    IR TIPS 07/08/2018 Soledad Gerlach, MD IMG VIR [facility]   ??? PR RIGHT HEART CATH O2 SATURATION & CARDIAC OUTPUT Right 01/18/2018    Procedure: Right Heart Catheterization;  Surgeon: Marlaine Hind, MD; Location: [facility] CATH;  Service: Cardiology   ??? SALPINGOOPHORECTOMY         MEDICATIONS:  Current Outpatient Medications   Medication Sig Dispense Refill   ??? busPIRone (BUSPAR) 5 MG tablet Take 1 tablet (5 mg total) by mouth two (2) times a day. 60 tablet 3   ??? cholecalciferol, vitamin D3, (CHOLECALCIFEROL) 1,000 unit tablet Take 1,000 Units by mouth Mon,Wed,Fri (2000).     ??? insulin ASPART (NOVOLOG FLEXPEN U-100 INSULIN) 100 unit/mL injection pen Use with given sliding scale    CBG 70-120: 0 units  CBG 121-150: 1 unit  CBG 151-200: 2 units  CBG 201-250: 3 units  CBG 251-300: 5 units  CBG 301-350: 7 units  CBG greater than 351: 9 units     ??? insulin glargine (LANTUS SOLOSTAR U-100 INSULIN) 100 unit/mL (3 mL) injection pen Inject 0.35 mL (35 Units total) under the skin daily. 10.5 mL 0   ??? lactulose (CHRONULAC) 10 gram/15 mL solution Take 20 g by mouth Four (4) times a day.      ??? levothyroxine (SYNTHROID, LEVOTHROID) 25 MCG tablet Take 25 mcg by mouth daily.     ??? magnesium oxide (MAG-OX) 400 mg (241.3 mg magnesium) tablet Take 800 mg by mouth daily. Take for hand and/or leg cramping.      ??? OXYGEN-AIR DELIVERY SYSTEMS MISC Inhale 2 L nightly.     ??? pantoprazole (PROTONIX) 40 MG tablet Take 40 mg by mouth daily.     ??? PARoxetine (PAXIL) 40 MG tablet Take 40 mg by mouth daily.     ??? pediatric multivit-iron-min (FLINTSTONES COMPLETE) tablet Chew 2 tablets daily.      ??? pen needle, diabetic 31 gauge x 1/4 Ndle      ??? potassium chloride SA (K-DUR,KLOR-CON) 20 MEQ tablet Take 20 mEq by mouth daily.      ??? rifAXIMin (XIFAXAN) 550 mg Tab Take 1 tablet (550 mg total) by mouth Two (2) times a day. 60 tablet 11   ??? rosuvastatin (CRESTOR) 5 MG tablet Take 5 mg by mouth every other day.     ??? spironolactone (ALDACTONE) 50 MG tablet Take 1 tablet (50 mg total) by mouth daily. 30 tablet 0   ??? torsemide (DEMADEX) 20 MG tablet Take 20 mg by mouth daily.       No current facility-administered medications for this visit. ALLERGIES:  Ferumoxytol and Tramadol hcl    CIRRHOSIS CARE:  1. EGD: s/p TIPS  2. Imaging:??03/28/2018  3. Vaccination for hepatitis A and B:??Immune to A and B  4. SBP prophylaxis [criteria: h/o SBP, or CP ???9 w/ bili ???3, renal insufficiency, or Na ???130]: Not applicable  5. Bone health: 12/25/2017  6. Nutrition: wt & exercise mgmnt, low Na intake and bedtime snack  discussed.  7. OTC agents: proper use of acetaminophen and avoidance of NSAIA's & HDS products discussed.  8. Transplant:??Undergoing evaluation   ??  FH: Mother: Died at 10 with lung cancer, smoker. ??Father: Died at 66 with COPD and bladder cancer, smoker. ??Brother: Died of colorectal cancer. ??Brother alive with possible alcoholic liver disease. ??Sisters x 3 alive with diabetes  ??  SH:????Patient was born and raised in [place], where she still resides. ??She has been married for 45 years. ??She is a disabled Charity fundraiser. 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 ??Her spouse is a retired/disabled Psychiatric nurse. ??She has a daughter who is a Sport and exercise psychologist.  ??  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 ??She has never used any tobacco products. ??She denies all illicit drug use.      RoS: Negative on 10 systems review other than what is mentioned above.    PE: Blood pressure 144/45, pulse 89, temperature 35.5 ??C (95.9 ??F), temperature source Tympanic, height 157.5 cm (5' 2), weight 83.3 kg (183 lb 11.2 oz), SpO2 100 %. Body mass index is 33.6 kg/m??.;  WDWN, no acute distress; No scleral icterus; no xanthelasma; oropharynx clear; neck supple without lymphadenopathy, carotid bruits or jugular venous distention; Lungs were clear to the bases by auscultation and percussion; Heart: regular rate and rhythm, normal S1, S2 without significant murmur or gallop.  Extremities: no clubbing, edema or palmer erythema.  Skin: no spider angiomata, no rash; Neuropsych: normal affect, speech pattern and cognitive function; oriented x 3; no asterixis.    LABS:  MELD-Na score: 16 at 08/08/2018 10:24 AM  MELD score: 16 at 08/08/2018 10:24 AM  Calculated from:  Serum Creatinine: 0.65 mg/dL (Rounded to 1 mg/dL) at 45/40/9811 91:47 AM  Serum Sodium: 142 mmol/L (Rounded to 137 mmol/L) at 08/08/2018 10:24 AM  Total Bilirubin: 3.8 mg/dL at 82/95/6213 08:65 AM  INR(ratio): 1.50 at 08/08/2018 10:24 AM  Age: 76 years    All lab results last 72 hours:    Recent Results (from the past 72 hour(s))   PT-INR    Collection Time: 08/08/18 10:24 AM   Result Value Ref Range    PT 17.4 (H) 10.2 - 13.1 sec    INR 1.50    Hepatic Function Panel    Collection Time: 08/08/18 10:24 AM   Result Value Ref Range    Albumin 2.8 (L) 3.5 - 5.0 g/dL    Total Protein 6.3 (L) 6.5 - 8.3 g/dL    Total Bilirubin 3.8 (H) 0.0 - 1.2 mg/dL    Bilirubin, Direct 7.84 (H) 0.00 - 0.40 mg/dL    AST 45 (H) 14 - 38 U/L    ALT 36 (H) <35 U/L    Alkaline Phosphatase 137 (H) 38 - 126 U/L   Sodium    Collection Time: 08/08/18 10:24 AM   Result Value Ref Range    Sodium 142 135 - 145 mmol/L   Potassium Level    Collection Time: 08/08/18 10:24 AM   Result Value Ref Range    Potassium 4.2 3.5 - 5.0 mmol/L   Creatinine    Collection Time: 08/08/18 10:24 AM   Result Value Ref Range    Creatinine 0.65 0.60 - 1.00 mg/dL    EGFR CKD-EPI Non-African American, Female >90 >=60 mL/min/1.3m2    EGFR CKD-EPI African American, Female >90 >=60 mL/min/1.66m2   CBC w/ Differential    Collection Time: 08/08/18 10:24 AM   Result Value  Ref Range    WBC 3.5 (L) 4.5 - 11.0 10*9/L    RBC 3.45 (L) 4.00 - 5.20 10*12/L    HGB 9.6 (L) 12.0 - 16.0 g/dL    HCT 16.1 (L) 09.6 - 46.0 %    MCV 91.8 80.0 - 100.0 fL    MCH 27.9 26.0 - 34.0 pg    MCHC 30.4 (L) 31.0 - 37.0 g/dL    RDW      MPV      Platelet 42 (L) 150 - 440 10*9/L    Variable HGB Concentration Marked (A) Not Present    Neutrophils % 64.3 %    Lymphocytes % 21.5 %    Monocytes % 6.5 %    Eosinophils % 2.1 %    Basophils % 1.1 %    Absolute Neutrophils 2.2 2.0 - 7.5 10*9/L Absolute Lymphocytes 0.7 (L) 1.5 - 5.0 10*9/L    Absolute Monocytes 0.2 0.2 - 0.8 10*9/L    Absolute Eosinophils 0.1 0.0 - 0.4 10*9/L    Absolute Basophils 0.0 0.0 - 0.1 10*9/L    Large Unstained Cells 5 (H) 0 - 4 %    Microcytosis Slight (A) Not Present    Macrocytosis Slight (A) Not Present    Anisocytosis Moderate (A) Not Present    Hypochromasia Marked (A) Not Present   Morphology Review    Collection Time: 08/08/18 10:24 AM   Result Value Ref Range    Smear Review Comments See Comment (A) Undefined         ASSESSMENT:  Ms. Ziana Heyliger is a pleasant 62 year old Caucasian female with history of cirrhosis secondary to NAFLD.  She is currently undergoing transplant evaluation.  She was last seen hepatology clinic on 07/02/2018.  At that clinic visit patient's hemoglobin was 6.4.  She was hospitalized locally for blood transfusion.  Her most recent meld score is 18.  Blood group O-.  Complications of her liver disease include variceal bleed, ascites and hepatic encephalopathy.  Patient states that she underwent an EGD locally in 12/2017.  She does have a history of GAVE as well and did require APC per patient report.  The official report is not available for review.  In regards to encephalopathy she is currently on lactulose monotherapy.  She is achieving 3???5 bowel movements a day.  Her ascites is currently being managed with torsemide 20 mg daily and spironolactone 50 mg daily.  She has not required any LVP's.  She did undergo a right heart catheterization in 01/2018 at [facility] which showed no evidence for pulmonary hypertension.  She is also seen [facility] pulmonary.  She underwent a DEXA scan on 12/25/2017.  An MRI from 12/25/2017 showed no evidence for [facility].  There is questionable nonocclusive portal vein thrombus.  Since her last clinic visit patient did undergo a repeat MRI and CT on 03/28/2018.  Her MRI was reviewed at [facility] conference and felt the PVT persists and is concerning for progression, worsening portal htn and compromising her transplant candidacy by loss of PV and SMV.  Anti-coagulation is not a good option due to her chronic PHG/GAVE blood loss.  We recommended proceeding with a TIPS.  She underwent a TIPS on 07/08/2018.  She also underwent embolization of mesocaval varices.  Her hepatic venous pressure gradient went from 10 to 2 mm Hg.  Since her TIPS she was hospitalized for hepatic encephalopathy on 07/26/2018 and now started on Xifaxan.  Patient has been having some difficulty in obtaining due to its  cost.  She is scheduled to see transplant surgery today as well as get ultrasound.  She does have a history of hypertension and hyperlipidemia.  She is immune to hepatitis A and B.      PLAN:  1. I have seen and examined this patient today in clinic and discussed her care with Dr. Jason Coop.  2.  Check laboratory studies.  3.  Continue all medications as directed maintain low-sodium diet.  4.  Check the results of today's TIPS Doppler ultrasound.  5.  Continue lactulose and titrate to desired effect of 3???5 bowel movements a day.  Continue Xifaxan as directed.  7.  Plan to see Ms. McAllister back in liver clinic in 4 months.      Tina Griffiths, PA-C  [facility]  [address], [address]  [place], [place]  [address]  Ph: [phone]  Fax: [phone]

## 2018-08-08 NOTE — Unmapped (Signed)
Saw Marilyn French in clinic today as follow up to recent hospitalization. Provided education re: lactulose dosing and expected outcomes as well as HE overall.  Patient also scheduled to meet with transplant surgery, VIR and undergo repeat Liver doppler.     Call placed and spoke with [facility] specialty pharmacy re: Talitha Givens - requesting they attempt to contact patient tomorrow.

## 2018-08-08 NOTE — Unmapped (Signed)
Transplant Surgery History and Physical      Assessment/Recommendations:    Marilyn French is a 62 y.o. female seen in consultation at the request of Collie Siad, MD for evaluation of candidacy for transplantation.    I spent 40 minutes with the patient obtaining the above history and physical examination, and greater than 50% of the time was spent counseling and on the substance of the discussion.    Today we discussed liver transplantation going over the surgery to be performed, the hospital course including length of stay, anti-rejection medications and their side effects, results and the cadaveric donor system.    I discussed in detail with Marilyn French the risks and benefits of liver transplantation, including but not limited to: the general anesthetic, monitoring lines, the incision, the hepatectomy, as well as reimplantation of the liver graft and immunosuppressant medications. In regards to the surgical procedure, we noted that it is a major operation performed under general anesthesia with the risks of heart attack, stroke and death. Multiple invasive means of monitoring may be necessary during the operation including an arterial line, a central venous catheter, a foley catheter inserted into the bladder, and a tube from your nose into your stomach to prevent stomach distension. After surgery, the patient will go to the Intensive Care Unit and is then sent to the regular floor when medically stable. I discussed the possible complications including the need for reoperation for bleeding, infection or other complications, the possibility of clotting/leakage of blood vessels, requiring either radiological intervention, surgical intervention, or even retransplantation. I reviewed the possibility of complications involving the biliary tract including leaks, strictures and need for retransplantation for biliary complications. I discussed the possibility of primary nonfunction of the liver graft requiring urgent retransplantation or the result of death. The patient understands the need for long-term immunosuppression therapy as well as monitoring of labs and immunosuppression. Anti-rejection medications, including Prograf or Cyclosporine (Neoral), Cellcept, steroids and others, will be needed after transplantation and for the patient???s entire lifetime. Problems include infection, cancer, hirsutism, tremors, gum swelling, hypertension, bone fractures, aggravation of diabetes or new onset diabetes, cataracts, and rashes. Finally, the donor system was reviewed. All donors are tested for infections and other diseases, but there is a small chance of transmission of diseases including viruses as well as the possible transmission of tumors. Some patients may elect to receive a liver from a donor who was exposed to the Hepatitis B or Hepatitis C virus and the recipient may require certain anti-viral medications to prevent this virus from damaging the new liver.    Finally, I reviewed with the patient how the surgery is expected to improve their health and quality of life, that the average length of hospitalization stay is 10-12 days, and that the length of their expected recovery period, including when normal daily activities may be resumed, will be patient dependent.    Marilyn French had all their questions answered and wishes to proceed with the liver transplant evaluation process.    - Flow has improved after TIPS and portal vein is patent.   - Portal vein thrombosis to be addressed by hepatology(anticoagulation).    This patient was seen and evaluated with Dr. Matilde Haymaker with no contraindication for transplant from a surgical perspective.      HPI  Marilyn French??is a 62 y.o.??female??with NAFLD??cirrhosis in eval for transplant??who presents??to [facility] as OSH transfer for TIPS procedure due to??blood loss??secondary??to portal hypertensive gastropathy/GAVE. She has had multiple episodes of UGI Endoscopy and  banding in the past for varices. She presented with massive blood loss due to variceal bleeding which led a Hb drop to 6, and did not respond to blood transfusion. Her cirrhosis is also complicated by ascites and PVT. PVT was diagnosed in Dec 2018. However, due to the bleeding she was not started on anticoagulation for PVT. TIPS was done on 07/08/2018 and patient has had no bleeding episodes since then. At the time of TIPS around 5.8 L of ascites was drained too. After TIPS placement, she had 1 episode of hepatic encephalopathy when she presented to a local hospital with confusion and vomiting. She was admitted and found to have blood ammonia level of 133. She was discharged after 5 days. She states that she gets confused at times.   She denies any abdominal pain, nausea, vomiting, hemetemesis, melena, constipation, chest pain , SOB.          Allergies    Ferumoxytol and Tramadol hcl      Medications      Current Outpatient Medications   Medication Sig Dispense Refill   ??? busPIRone (BUSPAR) 5 MG tablet Take 1 tablet (5 mg total) by mouth two (2) times a day. 60 tablet 3   ??? cholecalciferol, vitamin D3, (CHOLECALCIFEROL) 1,000 unit tablet Take 1,000 Units by mouth Mon,Wed,Fri (2000).     ??? insulin ASPART (NOVOLOG FLEXPEN U-100 INSULIN) 100 unit/mL injection pen Use with given sliding scale    CBG 70-120: 0 units  CBG 121-150: 1 unit  CBG 151-200: 2 units  CBG 201-250: 3 units  CBG 251-300: 5 units  CBG 301-350: 7 units  CBG greater than 351: 9 units     ??? insulin glargine (LANTUS SOLOSTAR U-100 INSULIN) 100 unit/mL (3 mL) injection pen Inject 0.35 mL (35 Units total) under the skin daily. 10.5 mL 0   ??? lactulose (CHRONULAC) 10 gram/15 mL solution Take 20 g by mouth Four (4) times a day.      ??? levothyroxine (SYNTHROID, LEVOTHROID) 25 MCG tablet Take 25 mcg by mouth daily.     ??? magnesium oxide (MAG-OX) 400 mg (241.3 mg magnesium) tablet Take 800 mg by mouth daily. Take for hand and/or leg cramping.      ??? OXYGEN-AIR DELIVERY SYSTEMS MISC Inhale 2 L nightly.     ??? pantoprazole (PROTONIX) 40 MG tablet Take 40 mg by mouth daily.     ??? PARoxetine (PAXIL) 40 MG tablet Take 40 mg by mouth daily.     ??? pediatric multivit-iron-min (FLINTSTONES COMPLETE) tablet Chew 2 tablets daily.      ??? pen needle, diabetic 31 gauge x 1/4 Ndle      ??? potassium chloride SA (K-DUR,KLOR-CON) 20 MEQ tablet Take 20 mEq by mouth daily.      ??? rifAXIMin (XIFAXAN) 550 mg Tab Take 1 tablet (550 mg total) by mouth Two (2) times a day. 60 tablet 11   ??? rosuvastatin (CRESTOR) 5 MG tablet Take 5 mg by mouth every other day.     ??? spironolactone (ALDACTONE) 50 MG tablet Take 1 tablet (50 mg total) by mouth daily. 30 tablet 0   ??? torsemide (DEMADEX) 20 MG tablet Take 20 mg by mouth daily.       No current facility-administered medications for this visit.     (Not in a hospital admission)        Past Medical History    Past Medical History:   Diagnosis Date   ??? Cirrhosis (CMS-HCC)    ???  Depression    ??? Diabetes mellitus (CMS-HCC)    ??? Fibromyalgia    ??? GAVE (gastric antral vascular ectasia)    ??? HTN (hypertension)    ??? Hypercholesterolemia    ??? Hypothyroid    ??? NAFLD (nonalcoholic fatty liver disease)    ??? Osteoporosis          Past Surgical History    Past Surgical History:   Procedure Laterality Date   ??? APPENDECTOMY     ??? CHOLECYSTECTOMY     ??? IR TIPS  07/08/2018    IR TIPS 07/08/2018 Soledad Gerlach, MD IMG VIR H&V [facility]   ??? PR RIGHT HEART CATH O2 SATURATION & CARDIAC OUTPUT Right 01/18/2018    Procedure: Right Heart Catheterization;  Surgeon: Marlaine Hind, MD;  Location: [facility] CATH;  Service: Cardiology   ??? SALPINGOOPHORECTOMY           Family History    The patient's family history is not on file..      Social History:    Tobacco use: denies  Alcohol use: denies  Drug use: denies      Review of Systems    A 12 system review of systems was negative except as noted in HPI    Objective     PE: Blood pressure 144/45, pulse 89, temperature 35.5 ??C (95.9 ??F), temperature source Tympanic, height 157.5 cm (5' 2.01), weight 83.3 kg (183 lb 11.2 oz). Body mass index is 33.59 kg/m??.  General: alert and oriented but slow in response, icterus+  Lungs: clear to auscultation, percussion to the bases, and unlabored breathing  Heart: euvolemic, regular rate and rhythm, normal S1 and S2, no murmur  Abd: soft, non-distended, non-tender, no organomegaly or masses  Ascites: none  Skin: no rashes, jaundice or skin lesions noted  Ext: mild edema+  Neuro: non-focal exam. No motor or sensory deficits        Test Results    Labs:  All lab results last 24 hours:    Recent Results (from the past 24 hour(s))   PT-INR    Collection Time: 08/08/18 10:24 AM   Result Value Ref Range    PT 17.4 (H) 10.2 - 13.1 sec    INR 1.50    Hepatic Function Panel    Collection Time: 08/08/18 10:24 AM   Result Value Ref Range    Albumin 2.8 (L) 3.5 - 5.0 g/dL    Total Protein 6.3 (L) 6.5 - 8.3 g/dL    Total Bilirubin 3.8 (H) 0.0 - 1.2 mg/dL    Bilirubin, Direct 7.82 (H) 0.00 - 0.40 mg/dL    AST 45 (H) 14 - 38 U/L    ALT 36 (H) <35 U/L    Alkaline Phosphatase 137 (H) 38 - 126 U/L   Sodium    Collection Time: 08/08/18 10:24 AM   Result Value Ref Range    Sodium 142 135 - 145 mmol/L   Potassium Level    Collection Time: 08/08/18 10:24 AM   Result Value Ref Range    Potassium 4.2 3.5 - 5.0 mmol/L   Creatinine    Collection Time: 08/08/18 10:24 AM   Result Value Ref Range    Creatinine 0.65 0.60 - 1.00 mg/dL    EGFR CKD-EPI Non-African American, Female >90 >=60 mL/min/1.34m2    EGFR CKD-EPI African American, Female >90 >=60 mL/min/1.47m2   CBC w/ Differential    Collection Time: 08/08/18 10:24 AM   Result Value Ref Range  WBC 3.5 (L) 4.5 - 11.0 10*9/L    RBC 3.45 (L) 4.00 - 5.20 10*12/L    HGB 9.6 (L) 12.0 - 16.0 g/dL    HCT 16.1 (L) 09.6 - 46.0 %    MCV 91.8 80.0 - 100.0 fL    MCH 27.9 26.0 - 34.0 pg    MCHC 30.4 (L) 31.0 - 37.0 g/dL    RDW      MPV      Platelet 42 (L) 150 - 440 10*9/L    Variable HGB Concentration Marked (A) Not Present    Neutrophils % 64.3 %    Lymphocytes % 21.5 %    Monocytes % 6.5 %    Eosinophils % 2.1 %    Basophils % 1.1 %    Absolute Neutrophils 2.2 2.0 - 7.5 10*9/L    Absolute Lymphocytes 0.7 (L) 1.5 - 5.0 10*9/L    Absolute Monocytes 0.2 0.2 - 0.8 10*9/L    Absolute Eosinophils 0.1 0.0 - 0.4 10*9/L    Absolute Basophils 0.0 0.0 - 0.1 10*9/L    Large Unstained Cells 5 (H) 0 - 4 %    Microcytosis Slight (A) Not Present    Macrocytosis Slight (A) Not Present    Anisocytosis Moderate (A) Not Present    Hypochromasia Marked (A) Not Present   Morphology Review    Collection Time: 08/08/18 10:24 AM   Result Value Ref Range    Smear Review Comments See Comment (A) Undefined       Imaging:     [place] Tips Doppler dated 08/08/2018: Pending     IR Tips dated 07/08/2018:  IMPRESSION:  1. Portal hypertension with Pre-TIPS portosystemic pressure gradient of 10 mmHg.   2. Successful creation of Transjugular Intrahepatic Portosystemic Shunt under ultrasound and fluoroscopy with a post-TIPS portosystemic pressure gradient of 2 mmHg.   3. Successful embolization of 2 of the outflow veins from a large mesocaval shunt using Amplatzer plugs.  4. Successful ultrasound-guided drainage of 5.8 of ascites.    CT Abdo dated 03/28/2018:  IMPRESSION:  Findings compatible with cirrhosis and portal hypertension. Nonocclusive thrombus identified within the main portal vein, extending into the proximal SMV and potentially the left portal vein. Findings are better evaluated on concurrent MR.  ??  No focal hepatic lesion by CT. Hepatic parenchyma is also better evaluated on concurrent MR.  ??  Mild peritoneal thickening and hyperenhancement along the left pelvic sidewall, nonspecific but may be seen in the setting of peritonitis.

## 2018-08-09 ENCOUNTER — Ambulatory Visit: Payer: Self-pay

## 2018-08-09 NOTE — Unmapped (Signed)
[facility] INTERVENTIONAL [facility]IOLOGY - FOLLOW UP VISIT    Reason for visit: Scheduled/established patient.    Subjective:     CC:   Chief Complaint   Patient presents with   ??? Follow-up     S/P TIPS x 07/08/18       History of Present Illness:   Marilyn French is a 62 y.o. female with NAFLD cirrhosis --> portal HTN --> esophageal varices with GAVE and hepatic encephalopathy (already on lactulose) who was referred for TIPS due to nonocclusive thrombus in her portal vein that could alter her transplant status. Marilyn French also has very large (approximately 2cm in size) retroperitoneal varices and severe splenomegaly (20 cm). TIPS had been planned as an outpatient but Marilyn French presented more acutely and was transferred to [facility] for melena and anemia. Marilyn French underwent TIPS creation and portosystemic shunt embolization with Dr. Durwin Nora on 07/08/2018 and did well postoperatively and now presents to the clinic today for one-month follow-up.     Interval History:    Patient reports that Marilyn French has had no acute bleeding events since the TIPS creation and her discharge; however, her GI bleeding leading up to her hospitalization during which the TIPS was created was not for acute blood loss but rather chronic, progressive blood loss and anemia.  Marilyn French was reassured by her CBC drawn today where her hemoglobin was back up above 9.      Patient was seen by Dr. Julieta Gutting earlier today, during which time Marilyn French states most of the conversation centered around treatment for her hepatic encephalopathy, of which Marilyn French had one significant episode prior to TIPS formation and now finds herself being slower cognitively following the TIPS.  More concerning, Marilyn French states that one week ago Marilyn French was admitted to a [facility] hospital for hepatic encephalopathy after Marilyn French attended an event for her grandchildren and became very confused.  Her family recalls Marilyn French was unable to use her insulin pen properly and chewed the wrapper of a piece of gum rather than the gum itself.  During the admission, Marilyn French states that doctors were able to get her bowel movements on a more regular schedule, which helped resolve her encephalopathy.  Prior to this, Marilyn French had been having trouble having more than 1 bowel movement per day despite being on lactulose.  After the admission, Marilyn French went on a short course of rifaximin, but the medication is quite expensive and Marilyn French is no longer taking it.  Marilyn French knows that ideally Marilyn French would be having 3-4 bowel movements per day, and Dr. Julieta Gutting gave her some suggestions this morning as to how to achieve this regularity. Marilyn French is interested in preventing future hospitalizations and was receptive to the idea that another procedure may be necessary to shut down some of these additional porto-systemic shunts and reduce her changes of encephalopathy.    Past Medical/Surgical History:  Past Medical History:   Diagnosis Date   ??? Cirrhosis (CMS-HCC)    ??? Depression    ??? Diabetes mellitus (CMS-HCC)    ??? Fibromyalgia    ??? GAVE (gastric antral vascular ectasia)    ??? HTN (hypertension)    ??? Hypercholesterolemia    ??? Hypothyroid    ??? NAFLD (nonalcoholic fatty liver disease)    ??? Osteoporosis        Past Surgical History:   Procedure Laterality Date   ??? APPENDECTOMY     ??? CHOLECYSTECTOMY     ??? IR TIPS  07/08/2018    IR TIPS 07/08/2018 Soledad Gerlach, MD  IMG VIR H&V [facility]   ??? PR RIGHT HEART CATH O2 SATURATION & CARDIAC OUTPUT Right 01/18/2018    Procedure: Right Heart Catheterization;  Surgeon: Marlaine Hind, MD;  Location: [facility] CATH;  Service: Cardiology   ??? SALPINGOOPHORECTOMY         Family History:  Patient family history is not on file.    Social History:    Social History     Socioeconomic History   ??? Marital status: Married     Spouse name: Not on file   ??? Number of children: Not on file   ??? Years of education: Not on file   ??? Highest education level: Not on file   Occupational History   ??? Not on file   Social Needs   ??? Financial resource strain: Not on file   ??? Food insecurity: Worry: Not on file     Inability: Not on file   ??? Transportation needs:     Medical: Not on file     Non-medical: Not on file   Tobacco Use   ??? Smoking status: Never Smoker   ??? Smokeless tobacco: Never Used   Substance and Sexual Activity   ??? Alcohol use: No   ??? Drug use: No   ??? Sexual activity: Not on file   Lifestyle   ??? Physical activity:     Days per week: Not on file     Minutes per session: Not on file   ??? Stress: Not on file   Relationships   ??? Social connections:     Talks on phone: Not on file     Gets together: Not on file     Attends religious service: Not on file     Active member of club or organization: Not on file     Attends meetings of clubs or organizations: Not on file     Relationship status: Not on file   Other Topics Concern   ??? Not on file   Social History Narrative   ??? Not on file       Allergies:  Allergies   Allergen Reactions   ??? Ferumoxytol      Patient has severe back and chest pain when receiving FERAHEME infusions.   ??? Tramadol Hcl      WEAKNESS       Contrast Allergy: No    Medications:    Current Outpatient Medications:   ???  busPIRone (BUSPAR) 5 MG tablet, Take 1 tablet (5 mg total) by mouth two (2) times a day., Disp: 60 tablet, Rfl: 3  ???  cholecalciferol, vitamin D3, (CHOLECALCIFEROL) 1,000 unit tablet, Take 1,000 Units by mouth Mon,Wed,Fri (2000)., Disp: , Rfl:   ???  insulin ASPART (NOVOLOG FLEXPEN U-100 INSULIN) 100 unit/mL injection pen, Use with given sliding scale  CBG 70-120: 0 units CBG 121-150: 1 unit CBG 151-200: 2 units CBG 201-250: 3 units CBG 251-300: 5 units CBG 301-350: 7 units CBG greater than 351: 9 units, Disp: , Rfl:   ???  insulin glargine (LANTUS SOLOSTAR U-100 INSULIN) 100 unit/mL (3 mL) injection pen, Inject 0.35 mL (35 Units total) under the skin daily., Disp: 10.5 mL, Rfl: 0  ???  lactulose (CHRONULAC) 10 gram/15 mL solution, Take 20 g by mouth Four (4) times a day. , Disp: , Rfl:   ???  levothyroxine (SYNTHROID, LEVOTHROID) 25 MCG tablet, Take 25 mcg by mouth daily., Disp: , Rfl:   ???  magnesium oxide (MAG-OX) 400 mg (241.3 mg magnesium)  tablet, Take 800 mg by mouth daily. Take for hand and/or leg cramping. , Disp: , Rfl:   ???  OXYGEN-AIR DELIVERY SYSTEMS MISC, Inhale 2 L nightly., Disp: , Rfl:   ???  pantoprazole (PROTONIX) 40 MG tablet, Take 40 mg by mouth daily., Disp: , Rfl:   ???  PARoxetine (PAXIL) 40 MG tablet, Take 40 mg by mouth daily., Disp: , Rfl:   ???  pediatric multivit-iron-min (FLINTSTONES COMPLETE) tablet, Chew 2 tablets daily. , Disp: , Rfl:   ???  pen needle, diabetic 31 gauge x 1/4 Ndle, , Disp: , Rfl:   ???  potassium chloride SA (K-DUR,KLOR-CON) 20 MEQ tablet, Take 20 mEq by mouth daily. , Disp: , Rfl:   ???  rifAXIMin (XIFAXAN) 550 mg Tab, Take 1 tablet (550 mg total) by mouth Two (2) times a day., Disp: 60 tablet, Rfl: 11  ???  rosuvastatin (CRESTOR) 5 MG tablet, Take 5 mg by mouth every other day., Disp: , Rfl:   ???  spironolactone (ALDACTONE) 50 MG tablet, Take 1 tablet (50 mg total) by mouth daily., Disp: 30 tablet, Rfl: 0  ???  torsemide (DEMADEX) 20 MG tablet, Take 20 mg by mouth daily., Disp: , Rfl:     Review of Systems:  Complete ROS performed. All systems negative except as detailed in HPI or below.     Objective:     Physical Exam    Vital Signs:     Vitals:    08/08/18 1350   BP: 157/59   Pulse: 85   Temp: 36.5 ??C (97.7 ??F)   SpO2: 100%       General: WD, WN 62 y.o. female in NAD.  HEENT: Normocephalic.  Sclera anicteric. EOMI.  Nares patent, OP pink, moist and without exudate.   Cardiovascular:  Normal peripheral perfusion. +2 bilateral radial and DP pulses.  Lungs: Breathing comfortably on room air.  Skin:  No rashes, lesions, jaundice, skin breakdown. Mild pallor diffusely   Abdominal/GI:  Abdomen round, soft, nontender, nondistended.  Musculoskeletal:  No clubbing, cyanosis or edema.   Neurological: Alert and oriented x 3. Moderately slow in her cognitive processing but no confusion. Normal sensation.   Psych/Mental Health:  Appropriate affect. Pertinent Laboratory Values:     Lab Results   Component Value Date    WBC 3.5 (L) 08/08/2018    HGB 9.6 (L) 08/08/2018    HCT 31.7 (L) 08/08/2018    PLT 42 (L) 08/08/2018       Lab Results   Component Value Date    NA 142 08/08/2018    K 4.2 08/08/2018    CL 102 07/11/2018    CO2 30.0 07/11/2018    BUN 10 07/11/2018    CREATININE 0.65 08/08/2018    GLU 219 (H) 07/11/2018    CALCIUM 8.5 07/11/2018    MG 1.8 07/11/2018    PHOS 3.6 01/02/2018       Lab Results   Component Value Date    BILITOT 3.8 (H) 08/08/2018    BILIDIR 0.80 (H) 08/08/2018    PROT 6.3 (L) 08/08/2018    ALBUMIN 2.8 (L) 08/08/2018    ALT 36 (H) 08/08/2018    AST 45 (H) 08/08/2018    ALKPHOS 137 (H) 08/08/2018    GGT 27 01/02/2018       Lab Results   Component Value Date    INR 1.50 08/08/2018    APTT 33.9 01/02/2018     MELD-Na score: 16 at 08/08/2018 10:24 AM  MELD score: 16 at 08/08/2018 10:24 AM  Calculated from:  Serum Creatinine: 0.65 mg/dL (Rounded to 1 mg/dL) at 09/81/1914 78:29 AM  Serum Sodium: 142 mmol/L (Rounded to 137 mmol/L) at 08/08/2018 10:24 AM  Total Bilirubin: 3.8 mg/dL at 56/21/3086 57:84 AM  INR(ratio): 1.50 at 08/08/2018 10:24 AM  Age: 54 years      ASA Score: ASA 3 - Patient with moderate systemic disease with functional limitations    Imaging Source: TIPS Doppler 08/08/18  TIPS stent extends from the right portal vein to the right hepatic vein and appears patent with no evidence of flow-limiting stenosis.           Proximal TIPS velocity: 1.2          Mid TIPS velocity: 1.6          Distal TIPS velocity: 1.7    ECOG Performance Score: 3 = Capable of only limited selfcare, confined to bed or chair more than 50% of waking hours    Assessment/Plan (Medical Decision Making):   This is an 62 y.o. female with NAFLD cirrhosis with portal hypertension, esophageal varices with G AVE, and extensive retroperitoneal portosystemic shunts who underwent TIPS formation with embolization of several shunts on 07/08/2018.  Multiple additional shunts were seen during the case by venography but could not be addressed at the time. Patient did well in the immediate postoperative period and has had no acute GI bleeding and hemoglobin is improving. Today's TIPS Doppler shows that it is functioning appropriately without evidence of flow-limiting stenosis. Of concern, however, Marilyn French had a serious event of hepatic encephalopathy requiring hospitalization approximately 1 week ago, and our thought is that there are still several large portosystemic connections that are shunting blood from it got out of the portal system and bypassing the detoxification of the liver.  Because of this, we feel that another procedure to address these shunt would be likely to benefit her.    I discussed the history, physical exam and imaging findings with the patient.     I felt the patient would benefit most from repeat portal venography and possible embolization of additional porto-systemic shunts under general anesthesia. Marilyn French and her family are in agreement and will attempt to schedule the procedure after January 1.        Please do not hesitate to contact me if you have additional questions.       L.C. Erin Sons, MD  Fellow  Division of Interventional Radiology  [facility]    ________________________________________________________    I have reviewed the notes and assessments performed by the fellow. I saw the patient with the fellow and I concur with his note.     Will discuss with hepatology.    R. Durwin Nora, MD

## 2018-08-10 DIAGNOSIS — E039 Hypothyroidism, unspecified: Secondary | ICD-10-CM | POA: Diagnosis not present

## 2018-08-10 DIAGNOSIS — K700 Alcoholic fatty liver: Secondary | ICD-10-CM | POA: Diagnosis not present

## 2018-08-10 DIAGNOSIS — M810 Age-related osteoporosis without current pathological fracture: Secondary | ICD-10-CM | POA: Diagnosis not present

## 2018-08-10 DIAGNOSIS — M797 Fibromyalgia: Secondary | ICD-10-CM | POA: Diagnosis not present

## 2018-08-10 DIAGNOSIS — Z48815 Encounter for surgical aftercare following surgery on the digestive system: Secondary | ICD-10-CM | POA: Diagnosis not present

## 2018-08-10 DIAGNOSIS — D500 Iron deficiency anemia secondary to blood loss (chronic): Secondary | ICD-10-CM | POA: Diagnosis not present

## 2018-08-10 DIAGNOSIS — K219 Gastro-esophageal reflux disease without esophagitis: Secondary | ICD-10-CM | POA: Diagnosis not present

## 2018-08-10 DIAGNOSIS — F329 Major depressive disorder, single episode, unspecified: Secondary | ICD-10-CM | POA: Diagnosis not present

## 2018-08-10 DIAGNOSIS — I10 Essential (primary) hypertension: Secondary | ICD-10-CM | POA: Diagnosis not present

## 2018-08-10 DIAGNOSIS — Z9181 History of falling: Secondary | ICD-10-CM | POA: Diagnosis not present

## 2018-08-10 DIAGNOSIS — E7800 Pure hypercholesterolemia, unspecified: Secondary | ICD-10-CM | POA: Diagnosis not present

## 2018-08-10 DIAGNOSIS — Z794 Long term (current) use of insulin: Secondary | ICD-10-CM | POA: Diagnosis not present

## 2018-08-10 DIAGNOSIS — E119 Type 2 diabetes mellitus without complications: Secondary | ICD-10-CM | POA: Diagnosis not present

## 2018-08-12 ENCOUNTER — Other Ambulatory Visit: Payer: Self-pay

## 2018-08-12 ENCOUNTER — Ambulatory Visit: Payer: Self-pay

## 2018-08-12 NOTE — Patient Outreach (Signed)
[facility]) Care Management  08/12/2018  Maria [name] Jan 17, 1956 [id number]  Successful outreach attempt to Maria [name].  HIPAA identifiers verified.   Maria [name] states she has already discussed her medications with her home health nurse and "a few other people."  She declines Varina to complete a post discharge medication review.   Joetta Manners, PharmD Clinical Pharmacist [place] [phone]

## 2018-08-13 ENCOUNTER — Ambulatory Visit: Payer: Self-pay

## 2018-08-13 DIAGNOSIS — Z6832 Body mass index (BMI) 32.0-32.9, adult: Secondary | ICD-10-CM | POA: Diagnosis not present

## 2018-08-13 DIAGNOSIS — E1165 Type 2 diabetes mellitus with hyperglycemia: Secondary | ICD-10-CM | POA: Diagnosis not present

## 2018-08-13 DIAGNOSIS — K7290 Hepatic failure, unspecified without coma: Secondary | ICD-10-CM | POA: Diagnosis not present

## 2018-08-13 DIAGNOSIS — I8500 Esophageal varices without bleeding: Secondary | ICD-10-CM | POA: Diagnosis not present

## 2018-08-13 DIAGNOSIS — K7460 Unspecified cirrhosis of liver: Secondary | ICD-10-CM | POA: Diagnosis not present

## 2018-08-13 DIAGNOSIS — Z299 Encounter for prophylactic measures, unspecified: Secondary | ICD-10-CM | POA: Diagnosis not present

## 2018-08-13 DIAGNOSIS — I10 Essential (primary) hypertension: Secondary | ICD-10-CM | POA: Diagnosis not present

## 2018-08-17 DIAGNOSIS — R0609 Other forms of dyspnea: Secondary | ICD-10-CM | POA: Diagnosis not present

## 2018-08-20 NOTE — Unmapped (Signed)
Pharmacy Pre-transplant Evaluation/ Selection Committee Note    Evaluation for liver transplant    Patient:  Marilyn French, Age:  62 y.o. old, Gender:  female    Allergies:  Ferumoxytol and Tramadol hcl    Immunization history:    There is no immunization history on file for this patient.    Alcohol, tobacco, illicit drug use history:  Social History     Substance and Sexual Activity   Alcohol Use No     Social History     Tobacco Use   Smoking Status Never Smoker   Smokeless Tobacco Never Used     Social History     Substance and Sexual Activity   Drug Use No       Home medications:  Outpatient Encounter Medications as of 08/20/2018   Medication Sig Dispense Refill   ??? busPIRone (BUSPAR) 5 MG tablet Take 1 tablet (5 mg total) by mouth two (2) times a day. 60 tablet 3   ??? cholecalciferol, vitamin D3, (CHOLECALCIFEROL) 1,000 unit tablet Take 1,000 Units by mouth Mon,Wed,Fri (2000).     ??? insulin ASPART (NOVOLOG FLEXPEN U-100 INSULIN) 100 unit/mL injection pen Use with given sliding scale    CBG 70-120: 0 units  CBG 121-150: 1 unit  CBG 151-200: 2 units  CBG 201-250: 3 units  CBG 251-300: 5 units  CBG 301-350: 7 units  CBG greater than 351: 9 units     ??? insulin glargine (LANTUS SOLOSTAR U-100 INSULIN) 100 unit/mL (3 mL) injection pen Inject 0.35 mL (35 Units total) under the skin daily. 10.5 mL 0   ??? lactulose (CHRONULAC) 10 gram/15 mL solution Take 20 g by mouth Four (4) times a day.      ??? levothyroxine (SYNTHROID, LEVOTHROID) 25 MCG tablet Take 25 mcg by mouth daily.     ??? magnesium oxide (MAG-OX) 400 mg (241.3 mg magnesium) tablet Take 800 mg by mouth daily. Take for hand and/or leg cramping.      ??? OXYGEN-AIR DELIVERY SYSTEMS MISC Inhale 2 L nightly.     ??? pantoprazole (PROTONIX) 40 MG tablet Take 40 mg by mouth daily.     ??? PARoxetine (PAXIL) 40 MG tablet Take 40 mg by mouth daily.     ??? pediatric multivit-iron-min (FLINTSTONES COMPLETE) tablet Chew 2 tablets daily.      ??? pen needle, diabetic 31 gauge x 1/4 Ndle      ??? potassium chloride SA (K-DUR,KLOR-CON) 20 MEQ tablet Take 20 mEq by mouth daily.      ??? rifAXIMin (XIFAXAN) 550 mg Tab Take 1 tablet (550 mg total) by mouth Two (2) times a day. 60 tablet 11   ??? rosuvastatin (CRESTOR) 5 MG tablet Take 5 mg by mouth every other day.     ??? spironolactone (ALDACTONE) 50 MG tablet Take 1 tablet (50 mg total) by mouth daily. 30 tablet 0   ??? torsemide (DEMADEX) 20 MG tablet Take 20 mg by mouth daily.       No facility-administered encounter medications on file as of 08/20/2018.        Potential pharmacotherapy related concerns for transplantation:  1. anticoagulation concerns: N/A  2. cytochrome P-450 isoenzyme???mediated drug interactions: Crestor  3. other drug-interactions with medications post-transplant:  none  4. mental health???related medication(s):  On Buspar and Paxil  5. chronic pain???related medication use:  none  6. use of hormonal contraception and replacement therapy:  none  7. prior or current use of immunosuppressants:none    8.  issues with drug absorption: none   9. use of herbal supplements:  none to transplant team's knowledge        Recommendations:  1. No pharmacological concerns for transplantation      Spent 10 minutes completing medication review and addressing any pharmacological concerns with members of the multidisciplinary transplant team.    Thank you,  Vertis Kelch, PharmD

## 2018-08-21 ENCOUNTER — Telehealth (INDEPENDENT_AMBULATORY_CARE_PROVIDER_SITE_OTHER): Payer: Self-pay | Admitting: *Deleted

## 2018-08-21 ENCOUNTER — Encounter (INDEPENDENT_AMBULATORY_CARE_PROVIDER_SITE_OTHER): Payer: PPO | Admitting: Ophthalmology

## 2018-08-21 DIAGNOSIS — I10 Essential (primary) hypertension: Secondary | ICD-10-CM | POA: Diagnosis not present

## 2018-08-21 DIAGNOSIS — H35033 Hypertensive retinopathy, bilateral: Secondary | ICD-10-CM | POA: Diagnosis not present

## 2018-08-21 DIAGNOSIS — E11311 Type 2 diabetes mellitus with unspecified diabetic retinopathy with macular edema: Secondary | ICD-10-CM

## 2018-08-21 DIAGNOSIS — H43813 Vitreous degeneration, bilateral: Secondary | ICD-10-CM

## 2018-08-21 DIAGNOSIS — E113391 Type 2 diabetes mellitus with moderate nonproliferative diabetic retinopathy without macular edema, right eye: Secondary | ICD-10-CM | POA: Diagnosis not present

## 2018-08-21 DIAGNOSIS — H2513 Age-related nuclear cataract, bilateral: Secondary | ICD-10-CM

## 2018-08-21 DIAGNOSIS — E113312 Type 2 diabetes mellitus with moderate nonproliferative diabetic retinopathy with macular edema, left eye: Secondary | ICD-10-CM | POA: Diagnosis not present

## 2018-08-21 NOTE — Telephone Encounter (Signed)
Patient called to let us know that her weight is 191.3 lbs today. This is the first time as it has been being in the 187 lb range.

## 2018-08-22 NOTE — Unmapped (Signed)
Call placed and spoke with Marilyn French - discussed recent Selection committee and recommendation that patient follow up with [facility], hepatology and doppler in 3 months. Patient to undergo repeat TIPS revision in Jan per VIR. Patient very sad and quiet on the phone.     Asked if patient had been successful locating local therapist. Patient reports she cancelled with Linden Dolin prior to first meeting d/t feeling uncomfortable. When asked what she meant by this she referenced a lack of information or photo available to review prior to meeting. Reports feeling very overwhelmed by current health situation and states I need help to get help. Acknowledged patients difficulty in engaging in therapy and offered suggestions such as: involving spouse / family, contacting local PCP / GI MD and insurance company. Assured patient I would reach out to [facility] Psychology team for additional assistance and that I am always available to help. Requested patient make two phone calls today / tomorrow to local MD's and insurance in effort to locate assistance.     Message sent to [facility] psychologist Mora Bellman for assistance.

## 2018-08-23 NOTE — Unmapped (Signed)
Call received from.name's spouse requesting clarification re: local counseling requirement.     Explained that local therapy with either a LCSW or Psychologist needs be initiated with visits every 2-3 weeks to establish care. Requested he contact me directly with therapist name / contact information.     Provided specific recommendations ie: to call local PCP, MD, insurance company. Also provided with letter containing four local facilities. Encouraged him to contact him if difficulty obtaining provider.

## 2018-08-25 ENCOUNTER — Encounter (HOSPITAL_COMMUNITY): Payer: Self-pay | Admitting: Emergency Medicine

## 2018-08-25 ENCOUNTER — Other Ambulatory Visit: Payer: Self-pay

## 2018-08-25 ENCOUNTER — Emergency Department (HOSPITAL_COMMUNITY)
Admission: EM | Admit: 2018-08-25 | Discharge: 2018-08-26 | Disposition: A | Discharge status: Home / Self Care | Payer: PPO | Attending: Emergency Medicine | Admitting: Emergency Medicine

## 2018-08-25 DIAGNOSIS — I5032 Chronic diastolic (congestive) heart failure: Secondary | ICD-10-CM | POA: Insufficient documentation

## 2018-08-25 DIAGNOSIS — E039 Hypothyroidism, unspecified: Secondary | ICD-10-CM | POA: Diagnosis not present

## 2018-08-25 DIAGNOSIS — Z794 Long term (current) use of insulin: Secondary | ICD-10-CM | POA: Insufficient documentation

## 2018-08-25 DIAGNOSIS — I2510 Atherosclerotic heart disease of native coronary artery without angina pectoris: Secondary | ICD-10-CM | POA: Insufficient documentation

## 2018-08-25 DIAGNOSIS — I110 Hypertensive heart disease with heart failure: Secondary | ICD-10-CM | POA: Diagnosis not present

## 2018-08-25 DIAGNOSIS — R109 Unspecified abdominal pain: Secondary | ICD-10-CM | POA: Diagnosis not present

## 2018-08-25 DIAGNOSIS — E1165 Type 2 diabetes mellitus with hyperglycemia: Secondary | ICD-10-CM | POA: Diagnosis not present

## 2018-08-25 DIAGNOSIS — R739 Hyperglycemia, unspecified: Secondary | ICD-10-CM

## 2018-08-25 DIAGNOSIS — Z79899 Other long term (current) drug therapy: Secondary | ICD-10-CM | POA: Insufficient documentation

## 2018-08-25 LAB — CBC
HCT: 28.5 % — ABNORMAL LOW (ref 36.0–46.0)
Hemoglobin: 8.3 g/dL — ABNORMAL LOW (ref 12.0–15.0)
MCH: 26.9 pg (ref 26.0–34.0)
MCHC: 29.1 g/dL — ABNORMAL LOW (ref 30.0–36.0)
MCV: 92.2 fL (ref 80.0–100.0)
Platelets: 49 10*3/uL — ABNORMAL LOW (ref 150–400)
RBC: 3.09 MIL/uL — ABNORMAL LOW (ref 3.87–5.11)
RDW: 19.5 % — ABNORMAL HIGH (ref 11.5–15.5)
WBC: 3.3 10*3/uL — ABNORMAL LOW (ref 4.0–10.5)
nRBC: 0 % (ref 0.0–0.2)

## 2018-08-25 LAB — AMMONIA: Ammonia: 93 umol/L — ABNORMAL HIGH (ref 9–35)

## 2018-08-25 LAB — BASIC METABOLIC PANEL
Anion gap: 10 (ref 5–15)
BUN: 14 mg/dL (ref 8–23)
CO2: 24 mmol/L (ref 22–32)
Calcium: 8.8 mg/dL — ABNORMAL LOW (ref 8.9–10.3)
Chloride: 103 mmol/L (ref 98–111)
Creatinine, Ser: 0.78 mg/dL (ref 0.44–1.00)
GFR calc Af Amer: >60 mL/min (ref >60)
GFR calc non Af Amer: >60 mL/min (ref >60)
Glucose, Bld: 303 mg/dL — ABNORMAL HIGH (ref 70–99)
Potassium: 3.4 mmol/L — ABNORMAL LOW (ref 3.5–5.1)
Sodium: 137 mmol/L (ref 135–145)

## 2018-08-25 LAB — HEPATIC FUNCTION PANEL
ALT: 32 U/L (ref 0–44)
AST: 37 U/L (ref 15–41)
Albumin: 2.7 g/dL — ABNORMAL LOW (ref 3.5–5.0)
Alkaline Phosphatase: 101 U/L (ref 38–126)
Bilirubin, Direct: 0.5 mg/dL — ABNORMAL HIGH (ref 0.0–0.2)
Indirect Bilirubin: 1.5 mg/dL — ABNORMAL HIGH (ref 0.3–0.9)
Total Bilirubin: 2 mg/dL — ABNORMAL HIGH (ref 0.3–1.2)
Total Protein: 6 g/dL — ABNORMAL LOW (ref 6.5–8.1)

## 2018-08-25 LAB — CBG MONITORING, ED
Glucose-Capillary: 332 mg/dL — ABNORMAL HIGH (ref 70–99)
Glucose-Capillary: 237 mg/dL — ABNORMAL HIGH (ref 70–99)

## 2018-08-25 LAB — LIPASE, BLOOD: Lipase: 51 U/L (ref 11–51)

## 2018-08-25 MED ORDER — SODIUM CHLORIDE 0.9 % IV SOLN: INTRAVENOUS | Status: DC

## 2018-08-25 MED ORDER — SODIUM CHLORIDE 0.9 % IV BOLUS
500.0000 mL | Freq: Once | INTRAVENOUS | Status: AC
  Administered 2018-08-25: 500 mL via INTRAVENOUS

## 2018-08-25 NOTE — ED Notes (Signed)
Pt reports she did not eat tonight but took 20 units of Humalog around 1800

## 2018-08-25 NOTE — ED Triage Notes (Signed)
Pt c/o high CBG, states she has taken her blood sugar 3 times today with all ranging from over 600-554, triage CBG 335, pt states she takes Lantus 45units in the am and has sliding scale meal coverage

## 2018-08-25 NOTE — ED Notes (Signed)
Pt is end stage liver patient She has had several week history of high blood sugar with insulin adjustment by her PCP  Tonight, she reports BS of greater than 400  Gave herself her insulin and came here  She denies Urinary sx  Fever or other infections

## 2018-08-25 NOTE — Discharge Instructions (Addendum)
Follow-up with your doctors.  As we discussed your ammonia level was along the high side today.  We discussed that with your liver doctor to see if they want to make some adjustments.  Blood sugar came down nicely here to into the 200 range.  Continue to follow your blood sugar carefully and follow-up with your endocrinologist.  Return for any new or worse symptoms.

## 2018-08-25 NOTE — ED Provider Notes (Signed)
[facility] EMERGENCY DEPARTMENT Provider Note   CSN: [id number] Arrival date & time: 08/25/18  1901     History   Chief Complaint Chief Complaint  Patient presents with  . Hyperglycemia    HPI Maria [name] is a 62 y.o. female.  Patient with a history of cirrhosis.  Patient's had trouble with elevated ammonia levels in the past.  Patient has had some mild abdominal discomfort but no fevers not feeling as if she got an upper respiratory infection.  Patient states her blood sugars at home have been high today in the 600 500 range.  Patient did take some insulin on sliding scale for meal coverage shortly prior to arrival.  She had contacted her doctor and they told her to come in.  Upon arrival here blood sugar was 335.  Patient's meds are significant for being on Demadex.  Patient also is on lactulose.  Patient is followed by GI medicine and endocrinology.  Patient states that her ammonia levels normally in the 50s.  She does not feel as if it significantly high.  The high blood sugars been going on for several days.      Past Medical History:  Diagnosis Date  . Anemia   . CHF (congestive heart failure) ([place])   . Cirrhosis ([place])   . Depression   . Diabetes mellitus   . Esophageal bleed, non-variceal   . Eye hemorrhage    Behind left eye  . Fibromyalgia   . Hypercholesteremia   . Hypertension   . Hypothyroidism   . NAFLD (nonalcoholic fatty liver disease)   . Osteoarthrosis   . Osteoporosis   . PONV (postoperative nausea and vomiting)   . UTI (urinary tract infection) 11/12 end of the month   Patient feels that she passed a kidney stone at that time    Patient Active Problem List   Diagnosis Date Noted  . Acute hepatic encephalopathy 07/26/2018  . Obesity, Class III, BMI 40-49.9 (morbid obesity) ([place])   . Melena   . Anemia 07/02/2018  . UGI bleed 11/09/2017  . Insulin dependent diabetes mellitus ([place])   . Absolute anemia 09/13/2017  . Idiopathic esophageal  varices with bleeding ([place]) 09/13/2017  . Other cirrhosis of liver ([place]) 08/22/2017  . NAFLD (nonalcoholic fatty liver disease) 08/14/2017  . Encephalopathy, hepatic ([place]) 08/14/2017  . Metabolic encephalopathy 80/99/8338  . Hyperammonemia ([place]) 07/18/2017  . Hypothyroidism 07/18/2017  . Acute on chronic right-sided congestive heart failure ([place]) 07/06/2017  . Pulmonary hypertension ([place]) 07/06/2017  . GAVE (gastric antral vascular ectasia) 07/06/2017  . GI bleeding 07/06/2017  . Symptomatic anemia 07/04/2017  . Hypokalemia 07/04/2017  . Chronic diastolic heart failure ([place]) 06/11/2017  . CAD (coronary artery disease) 06/11/2017  . Nocturnal hypoxemia 02/22/2017  . Idiopathic esophageal varices without bleeding ([place]) 01/31/2017  . Dyspnea and respiratory abnormalities 01/25/2017  . Esophageal varices in cirrhosis ([place]) 05/19/2016  . Hepatic cirrhosis ([place]) 05/19/2016  . Esophageal varices ([place]) 12/09/2012  . Dysphagia, unspecified(787.20) 12/09/2012  . Anxiety 08/26/2012  . Cirrhosis, nonalcoholic ([place]) 25/01/3975  . Diabetes mellitus ([place]) 10/09/2011  . History of esophageal varices 10/09/2011  . Iron deficiency anemia 10/09/2011  . GERD (gastroesophageal reflux disease) 10/09/2011  . Thrombocytopenia ([place]) 10/09/2011    Past Surgical History:  Procedure Laterality Date  . APPENDECTOMY  1980  . BILATERAL SALPINGOOPHORECTOMY    . CHOLECYSTECTOMY    . COLONOSCOPY  03/15/2011  . COLONOSCOPY N/A 03/24/2016   Procedure: COLONOSCOPY;  Surgeon: [name],  MD;  [facility];  Service: Endoscopy;  Laterality: N/A;  855  . ESOPHAGEAL BANDING  04/24/2012   Procedure: ESOPHAGEAL BANDING;  Surgeon: [name], MD;  [facility];  Service: Endoscopy;  Laterality: N/A;  . Esophageal BANDING  08/03/2012   [facility] in [place] , [place]  09/24/2012   Procedure: ESOPHAGEAL BANDING;  Surgeon: [name], MD;  Location: [facility]  ORS;  Service: Endoscopy;  Laterality: N/A;  Banding x 3  . ESOPHAGEAL BANDING N/A 01/29/2013   Procedure: ESOPHAGEAL BANDING;  Surgeon: [name], MD;  [facility];  Service: Endoscopy;  Laterality: N/A;  . ESOPHAGEAL BANDING N/A 06/19/2014   Procedure: ESOPHAGEAL BANDING;  Surgeon: [name], MD;  [facility];  Service: Endoscopy;  Laterality: N/A;  . ESOPHAGEAL BANDING N/A 01/05/2016   Procedure: ESOPHAGEAL BANDING;  Surgeon: [name], MD;  [facility];  Service: Endoscopy;  Laterality: N/A;  . ESOPHAGEAL BANDING N/A 08/03/2016   Procedure: ESOPHAGEAL BANDING;  Surgeon: [name], MD;  [facility];  Service: Endoscopy;  Laterality: N/A;  . ESOPHAGEAL BANDING N/A 05/02/2017   Procedure: ESOPHAGEAL BANDING;  Surgeon: [name], MD;  [facility];  Service: Endoscopy;  Laterality: N/A;  . ESOPHAGOGASTRODUODENOSCOPY  04/24/2012   Procedure: ESOPHAGOGASTRODUODENOSCOPY (EGD);  Surgeon: [name], MD;  [facility];  Service: Endoscopy;  Laterality: N/A;  300  . ESOPHAGOGASTRODUODENOSCOPY N/A 01/29/2013   Procedure: ESOPHAGOGASTRODUODENOSCOPY (EGD);  Surgeon: [name], MD;  [facility];  Service: Endoscopy;  Laterality: N/A;  1200  . ESOPHAGOGASTRODUODENOSCOPY N/A 05/22/2013   Procedure: ESOPHAGOGASTRODUODENOSCOPY (EGD);  Surgeon: [name], MD;  [facility];  Service: Endoscopy;  Laterality: N/A;  1:55  . ESOPHAGOGASTRODUODENOSCOPY N/A 12/31/2013   Procedure: ESOPHAGOGASTRODUODENOSCOPY (EGD);  Surgeon: [name], MD;  [facility];  Service: Endoscopy;  Laterality: N/A;  1200  . ESOPHAGOGASTRODUODENOSCOPY N/A 06/19/2014   Procedure: ESOPHAGOGASTRODUODENOSCOPY (EGD);  Surgeon: [name], MD;  [facility];  Service: Endoscopy;  Laterality: N/A;  1055  . ESOPHAGOGASTRODUODENOSCOPY N/A 01/15/2015   Procedure: ESOPHAGOGASTRODUODENOSCOPY  (EGD);  Surgeon: [name], MD;  [facility];  Service: Endoscopy;  Laterality: N/A;  730 - moved to 9:45 - moved to 1250-Ann notified pt  . ESOPHAGOGASTRODUODENOSCOPY N/A 06/30/2015   Procedure: ESOPHAGOGASTRODUODENOSCOPY (EGD);  Surgeon: [name], MD;  [facility];  Service: Endoscopy;  Laterality: N/A;  1200  . ESOPHAGOGASTRODUODENOSCOPY N/A 01/05/2016   Procedure: ESOPHAGOGASTRODUODENOSCOPY (EGD);  Surgeon: [name], MD;  [facility];  Service: Endoscopy;  Laterality: N/A;  830  . ESOPHAGOGASTRODUODENOSCOPY N/A 08/03/2016   Procedure: ESOPHAGOGASTRODUODENOSCOPY (EGD);  Surgeon: [name], MD;  [facility];  Service: Endoscopy;  Laterality: N/A;  930  . ESOPHAGOGASTRODUODENOSCOPY N/A 05/02/2017   Procedure: ESOPHAGOGASTRODUODENOSCOPY (EGD);  Surgeon: [name], MD;  [facility];  Service: Endoscopy;  Laterality: N/A;  12:00  . ESOPHAGOGASTRODUODENOSCOPY N/A 08/17/2017   Procedure: ESOPHAGOGASTRODUODENOSCOPY (EGD);  Surgeon: [name], MD;  [facility];  Service: Endoscopy;  Laterality: N/A;  8:15  . ESOPHAGOGASTRODUODENOSCOPY N/A 09/12/2017   Procedure: ESOPHAGOGASTRODUODENOSCOPY (EGD);  Surgeon: [name], MD;  [facility];  Service: Endoscopy;  Laterality: N/A;  1040  . ESOPHAGOGASTRODUODENOSCOPY N/A 10/10/2017   Procedure: ESOPHAGOGASTRODUODENOSCOPY (EGD);  Surgeon: [name], MD;  [facility];  Service: Endoscopy;  Laterality: N/A;  225  . ESOPHAGOGASTRODUODENOSCOPY N/A 11/09/2017   Procedure: ESOPHAGOGASTRODUODENOSCOPY (EGD);  Surgeon: [name], MD;  [facility];  Service: Endoscopy;  Laterality: N/A;  . ESOPHAGOGASTRODUODENOSCOPY (EGD) WITH PROPOFOL  09/24/2012   Procedure: ESOPHAGOGASTRODUODENOSCOPY (EGD) WITH PROPOFOL;  Surgeon: [name], MD;  Location: [facility];  Service: Endoscopy;  Laterality: N/A;  GE junction at 36    . ESOPHAGOGASTRODUODENOSCOPY (EGD) WITH PROPOFOL N/A 07/06/2017   Procedure: ESOPHAGOGASTRODUODENOSCOPY (EGD) WITH PROPOFOL;  Surgeon: [name], MD;  [facility];  Service: Endoscopy;  Laterality: N/A;  . ESOPHAGOGASTRODUODENOSCOPY W/ BANDING  08/2010  . GIVENS CAPSULE STUDY N/A 07/05/2017   Procedure: GIVENS CAPSULE STUDY;  Surgeon: [name], MD;  [facility];  Service: Endoscopy;  Laterality: N/A;  . HOT HEMOSTASIS N/A 08/17/2017   Procedure: HOT HEMOSTASIS (ARGON PLASMA COAGULATION/BICAP);  Surgeon: [name], MD;  [facility];  Service: Endoscopy;  Laterality: N/A;  . HOT HEMOSTASIS  09/12/2017   Procedure: HOT HEMOSTASIS (ARGON PLASMA COAGULATION/BICAP);  Surgeon: [name], MD;  [facility];  Service: Endoscopy;;  gastric  . HOT HEMOSTASIS  10/10/2017   Procedure: HOT HEMOSTASIS (ARGON PLASMA COAGULATION/BICAP);  Surgeon: [name], MD;  [facility];  Service: Endoscopy;;  . POLYPECTOMY  03/24/2016   Procedure: POLYPECTOMY;  Surgeon: [name], MD;  [facility];  Service: Endoscopy;;  sigmoid polyp  . RIGHT HEART CATH N/A 04/16/2017   Procedure: Right Heart Cath;  Surgeon: [name], MD;  Location: [facility];  Service: Cardiovascular;  Laterality: N/A;  . RIGHT HEART CATH N/A 07/12/2017   Procedure: RIGHT HEART CATH;  Surgeon: [name], MD;  Location: [facility];  Service: Cardiovascular;  Laterality: N/A;  . TONSILLECTOMY    . UPPER GASTROINTESTINAL ENDOSCOPY  03/15/2011   EGD ED BANDING/TCS  . UPPER GASTROINTESTINAL ENDOSCOPY  09/05/2010  . UPPER GASTROINTESTINAL ENDOSCOPY  08/11/2010  . VAGINAL HYSTERECTOMY       OB History   None      Home Medications    Prior to Admission medications   Medication Sig Start Date End Date Taking? Authorizing Provider  busPIRone (BUSPAR) 5 MG tablet Take 5 mg by mouth 2 (two) times daily.   Yes  [provider]  cholecalciferol (VITAMIN D) 1000 UNITS tablet Take 1,000 Units by mouth 3 (three) times a week.    Yes [provider]  HUMALOG 100 UNIT/ML injection Inject 12-20 Units into the skin 3 (three) times daily with meals.  07/01/18  Yes [provider]  Insulin Glargine (LANTUS SOLOSTAR) 100 UNIT/ML Solostar Pen Inject 40 Units into the skin daily at 10 pm. Patient taking differently: Inject 45 Units into the skin every morning.  07/31/18  Yes Barton Dubois, MD  lactulose (CHRONULAC) 10 GM/15ML solution TAKE 30 MLS BY MOUTH FOUR TIMES DAILY Patient taking differently: Take 20 g by mouth 4 (four) times daily.  01/07/18  Yes Rehman, Mechele Dawley, MD  levothyroxine (SYNTHROID, LEVOTHROID) 25 MCG tablet Take 25 mcg by mouth daily before breakfast.    Yes [provider]  magnesium oxide (MAG-OX) 400 MG tablet Take 400 mg by mouth daily.    Yes [provider]  multivitamin (VIT W/EXTRA C) CHEW chewable tablet Chew 1 tablet by mouth daily.   Yes  [provider]  OXYGEN Inhale 2 L into the lungs at bedtime.    Yes [provider]  pantoprazole (PROTONIX) 40 MG tablet Take 1 tablet (40 mg total) by mouth 2 (two) times daily. Patient taking differently: Take 40 mg by mouth daily.  07/07/18  Yes Barton Dubois, MD  potassium chloride (K-DUR) 10 MEQ tablet Take 10 mEq by mouth 2 (two) times daily.  03/26/18  Yes [provider]  rifaximin (XIFAXAN) 550 MG TABS tablet Take 1 tablet (550 mg total) by mouth 3 (three) times daily. Patient taking differently: Take 550 mg by mouth 2 (two) times daily.  07/31/18  Yes Barton Dubois, MD  rosuvastatin (CRESTOR) 5 MG tablet Take 1 tablet (5 mg total) by mouth every other day. 06/27/17 10/09/18 Yes [name], MD  spironolactone (ALDACTONE) 50 MG tablet Take 0.5 tablets (25 mg total) by mouth 2 (two) times daily. Hold it until further instructions by Dr. Laural Golden Patient taking  differently: Take 50 mg by mouth every Monday, Wednesday, and Friday.  07/31/18  Yes Barton Dubois, MD  torsemide (DEMADEX) 20 MG tablet Take 1 tablet (20 mg total) by mouth daily. Hold until further instructions by Dr. Laural Golden Patient taking differently: Take 20 mg by mouth daily.  07/31/18  Yes Barton Dubois, MD  trimethoprim-polymyxin b (POLYTRIM) ophthalmic solution Place 1 drop into the left eye See admin instructions. Use 3 to 4 times daily for 2 days following monthly eye injection 05/06/18  Yes [provider]  zinc sulfate 220 (50 Zn) MG capsule Take 1 capsule (220 mg total) by mouth daily. 08/01/18  Yes Barton Dubois, MD  Insulin Pen Needle 31G X 5 MM MISC Use with each insulin injection 07/22/17   [name], [name], MD    Family History Family History  Problem Relation Age of Onset  . Lung cancer Mother   . Diabetes Father   . Diabetes Sister   . Hypertension Sister   . Hypothyroidism Sister   . Colon cancer Brother   . Diabetes Sister   . Hypothyroidism Brother   . Healthy Daughter   . Obesity Daughter   . Hypertension Daughter     Social History Social History   Tobacco Use  . Smoking status: Never Smoker  . Smokeless tobacco: Never Used  Substance Use Topics  . Alcohol use: No    Alcohol/week: 0.0 standard drinks  . Drug use: No     Allergies   Ferumoxytol and Tramadol hcl   Review of Systems Review of Systems  Constitutional: Positive for fatigue. Negative for fever.  HENT: Positive for congestion.   Eyes: Negative for redness.  Respiratory: Negative for shortness of breath.   Cardiovascular: Negative for chest pain.  Gastrointestinal: Positive for abdominal pain. Negative for nausea and vomiting.  Genitourinary: Negative for dysuria and hematuria.  Musculoskeletal: Negative for back pain.  Allergic/Immunologic: Positive for immunocompromised state.  Neurological: Negative for syncope.  Hematological: Does not bruise/bleed easily.    Psychiatric/Behavioral: Negative for confusion.     Physical Exam Updated Vital Signs BP (!) 127/47 (BP Location: Left Arm)   Pulse 100   Temp 97.9 F (36.6 C) (Oral)   Resp 17   Ht 1.575 m (5' 2" )   Wt 84.8 kg   SpO2 99%   BMI 34.20 kg/m   Physical Exam  Constitutional: She is oriented to person, place, and time. She appears well-developed and well-nourished. No distress.  HENT:  Head: Normocephalic and atraumatic.  Mouth/Throat: Oropharynx is clear and moist.  Eyes: Pupils are equal, round, and reactive to light. Conjunctivae and EOM are normal. Scleral icterus is present.  Neck: Neck supple.  Cardiovascular: Normal rate and regular rhythm.  Pulmonary/Chest: Effort normal and breath sounds normal. No respiratory distress.  Abdominal: Soft. Bowel sounds are normal. She exhibits no distension. There is no tenderness.  Musculoskeletal: Normal range of motion. She exhibits no edema.  Neurological: She is alert and oriented to person, place, and time. No cranial nerve deficit or sensory deficit. She exhibits normal muscle tone. Coordination normal.  Skin: Skin is warm.  Nursing note and vitals reviewed.    ED Treatments / Results  Labs (all labs ordered are listed, but only abnormal results are displayed) Labs Reviewed  BASIC METABOLIC PANEL - Abnormal; Notable for the following components:      Result Value   Potassium 3.4 (*)    Glucose, Bld 303 (*)    Calcium 8.8 (*)    All other components within normal limits  CBC - Abnormal; Notable for the following components:   WBC 3.3 (*)    RBC 3.09 (*)    Hemoglobin 8.3 (*)    HCT 28.5 (*)    MCHC 29.1 (*)    RDW 19.5 (*)    Platelets 49 (*)    All other components within normal limits  HEPATIC FUNCTION PANEL - Abnormal; Notable for the following components:   Total Protein 6.0 (*)    Albumin 2.7 (*)    Total Bilirubin 2.0 (*)    Bilirubin, Direct 0.5 (*)    Indirect Bilirubin 1.5 (*)    All other components  within normal limits  AMMONIA - Abnormal; Notable for the following components:   Ammonia 93 (*)    All other components within normal limits  CBG MONITORING, ED - Abnormal; Notable for the following components:   Glucose-Capillary 332 (*)    All other components within normal limits  CBG MONITORING, ED - Abnormal; Notable for the following components:   Glucose-Capillary 237 (*)    All other components within normal limits  LIPASE, BLOOD  URINALYSIS, ROUTINE W REFLEX MICROSCOPIC    EKG None  Radiology No results found.  Procedures Procedures (including critical care time)  Medications Ordered in ED Medications  0.9 %  sodium chloride infusion ( Intravenous Rate/Dose Change 08/25/18 2218)  sodium chloride 0.9 % bolus 500 mL (500 mLs Intravenous New Bag/Given 08/25/18 2204)     Initial Impression / Assessment and Plan / ED Course  I have reviewed the triage vital signs and the nursing notes.  Pertinent labs & imaging results that were available during my care of the patient were reviewed by me and considered in my medical decision making (see chart for details).   Patient blood sugars here went down into the 200 range.  Patient's abdomen without any significant tenderness.  Initially had ordered urinalysis with patient states she did have a concern for urinary tract infection so we discharged her without this because she wanted to go home.  Patient's ammonia level is in the 90s but she seems to be mentating fine.  Lab results provided to her she will follow-up with her endocrinologist regarding the blood sugar issue and also with her GI doctor regarding the elevated ammonia level they may want to adjust her lactulose.  Patient stable for discharge home.    Final Clinical Impressions(s) / ED Diagnoses   Final diagnoses:  Hyperglycemia    ED  Discharge Orders    None       Fredia Sorrow, MD 08/26/18 [phone]

## 2018-08-30 ENCOUNTER — Telehealth (INDEPENDENT_AMBULATORY_CARE_PROVIDER_SITE_OTHER): Payer: Self-pay | Admitting: *Deleted

## 2018-08-30 NOTE — Telephone Encounter (Signed)
08/21/2018 patient called and stated that her weight was 191.3 lbs. At his time Dr.Rehman made the recommendation that the patient take Spironolactone 50 mg daily ,and Torsemide 20 mg take on Monday  - Wednesday -Friday. If her weight dropped by 5 lbs to stop the diuretics.  Patient has called this morning . She was wanting instructions on how to take the medications as she had lost the paper she had it written down on. She has been taking both medications daily. Her weight is 188.2 lbs. She says that she is having trouble with high blood sugars.   Patient called and given the instructions  as before , Dr.Rehman will be made aware and if there are any changes we will let the patient know.

## 2018-09-02 NOTE — Unmapped (Signed)
Sent referral letter to patient regarding local counseling.

## 2018-09-03 DIAGNOSIS — I10 Essential (primary) hypertension: Secondary | ICD-10-CM | POA: Diagnosis not present

## 2018-09-03 DIAGNOSIS — Z6833 Body mass index (BMI) 33.0-33.9, adult: Secondary | ICD-10-CM | POA: Diagnosis not present

## 2018-09-03 DIAGNOSIS — K7460 Unspecified cirrhosis of liver: Secondary | ICD-10-CM | POA: Diagnosis not present

## 2018-09-03 DIAGNOSIS — E1165 Type 2 diabetes mellitus with hyperglycemia: Secondary | ICD-10-CM | POA: Diagnosis not present

## 2018-09-03 DIAGNOSIS — Z299 Encounter for prophylactic measures, unspecified: Secondary | ICD-10-CM | POA: Diagnosis not present

## 2018-09-03 DIAGNOSIS — K7290 Hepatic failure, unspecified without coma: Secondary | ICD-10-CM | POA: Diagnosis not present

## 2018-09-05 NOTE — Unmapped (Signed)
Return call placed and spoke with Hildred Priest who reports continued confusion regarding need for local therpay. Reviewed recommendation that patient establish with local psychologist  / LCSW for management of symptoms of depression and to assist with healthy coping mechanisms as related to her disease / health. Reiterated that established / ongoing  care is a requirement for active listing. He states understanding.

## 2018-09-09 ENCOUNTER — Other Ambulatory Visit (INDEPENDENT_AMBULATORY_CARE_PROVIDER_SITE_OTHER): Payer: Self-pay | Admitting: *Deleted

## 2018-09-09 DIAGNOSIS — K922 Gastrointestinal hemorrhage, unspecified: Secondary | ICD-10-CM

## 2018-09-09 DIAGNOSIS — K31819 Angiodysplasia of stomach and duodenum without bleeding: Secondary | ICD-10-CM

## 2018-09-09 DIAGNOSIS — K7460 Unspecified cirrhosis of liver: Secondary | ICD-10-CM

## 2018-09-09 DIAGNOSIS — K921 Melena: Secondary | ICD-10-CM

## 2018-09-14 ENCOUNTER — Other Ambulatory Visit (HOSPITAL_COMMUNITY): Payer: Self-pay | Admitting: Internal Medicine

## 2018-09-16 ENCOUNTER — Other Ambulatory Visit: Payer: Self-pay

## 2018-09-16 ENCOUNTER — Other Ambulatory Visit (INDEPENDENT_AMBULATORY_CARE_PROVIDER_SITE_OTHER): Payer: Self-pay | Admitting: Internal Medicine

## 2018-09-16 ENCOUNTER — Other Ambulatory Visit (HOSPITAL_COMMUNITY)
Admission: RE | Admit: 2018-09-16 | Discharge: 2018-09-16 | Disposition: A | Discharge status: Home / Self Care | Payer: PPO | Source: Ambulatory Visit | Attending: Internal Medicine | Admitting: Internal Medicine

## 2018-09-16 DIAGNOSIS — K7469 Other cirrhosis of liver: Secondary | ICD-10-CM | POA: Diagnosis not present

## 2018-09-16 DIAGNOSIS — K922 Gastrointestinal hemorrhage, unspecified: Secondary | ICD-10-CM

## 2018-09-16 DIAGNOSIS — D509 Iron deficiency anemia, unspecified: Secondary | ICD-10-CM | POA: Insufficient documentation

## 2018-09-16 DIAGNOSIS — I8501 Esophageal varices with bleeding: Secondary | ICD-10-CM | POA: Diagnosis not present

## 2018-09-16 DIAGNOSIS — K31819 Angiodysplasia of stomach and duodenum without bleeding: Secondary | ICD-10-CM | POA: Diagnosis not present

## 2018-09-16 DIAGNOSIS — R0609 Other forms of dyspnea: Secondary | ICD-10-CM | POA: Diagnosis not present

## 2018-09-16 LAB — BASIC METABOLIC PANEL
Anion gap: 4 — ABNORMAL LOW (ref 5–15)
BUN: 13 mg/dL (ref 8–23)
CO2: 24 mmol/L (ref 22–32)
Calcium: 8.1 mg/dL — ABNORMAL LOW (ref 8.9–10.3)
Chloride: 108 mmol/L (ref 98–111)
Creatinine, Ser: 0.8 mg/dL (ref 0.44–1.00)
GFR calc Af Amer: >60 mL/min (ref >60)
GFR calc non Af Amer: >60 mL/min (ref >60)
Glucose, Bld: 362 mg/dL — ABNORMAL HIGH (ref 70–99)
Potassium: 3.5 mmol/L (ref 3.5–5.1)
Sodium: 136 mmol/L (ref 135–145)

## 2018-09-16 LAB — HEMOGLOBIN AND HEMATOCRIT, BLOOD
HCT: 27.6 % — ABNORMAL LOW (ref 36.0–46.0)
Hemoglobin: 7.6 g/dL — ABNORMAL LOW (ref 12.0–15.0)

## 2018-09-16 LAB — PREPARE RBC (CROSSMATCH)

## 2018-09-16 NOTE — Patient Outreach (Signed)
[facility]) Care Management  09/16/2018  Maria [name] 08-25-1956 [id number]  TELEPHONE SCREENING Referral date: 08/28/18 Referral source: utilization management Referral reason: medication assistance Insurance: [employer] advantage  Telephone call to patient regarding. HIPAA verified with patient. Explained reason for call. Patient states she is on the transplant list. She states she has someone in the financial department with the transplant division that is working with her for medication assistance. Patient states they have assisted her with the paperwork and she is currently waiting to hear back.   Patient denies need for [facility] care management services at this time. RNCM offered to send patient [facility] care management brochure for future reference.  Patient verbally agreed.   PLAN: RNCM will close patient due to refusal of services.  RNCM will send patient [facility] care management brochure/ magnet,. RNCM will send closure letter to patients primary MD .   [name] RN,BSN,CCM [facility] Telephonic  [phone]

## 2018-09-17 ENCOUNTER — Encounter (HOSPITAL_COMMUNITY)
Admission: RE | Admit: 2018-09-17 | Discharge: 2018-09-17 | Disposition: A | Discharge status: Home / Self Care | Payer: PPO | Source: Ambulatory Visit | Attending: Internal Medicine | Admitting: Internal Medicine

## 2018-09-17 DIAGNOSIS — K922 Gastrointestinal hemorrhage, unspecified: Secondary | ICD-10-CM | POA: Diagnosis not present

## 2018-09-17 LAB — HEMOGLOBIN AND HEMATOCRIT, BLOOD
HCT: 32.6 % — ABNORMAL LOW (ref 36.0–46.0)
Hemoglobin: 9.5 g/dL — ABNORMAL LOW (ref 12.0–15.0)

## 2018-09-17 MED ORDER — FUROSEMIDE 10 MG/ML IJ SOLN
20.0000 mg | Freq: Once | INTRAMUSCULAR | Status: AC
  Administered 2018-09-17: 20 mg via INTRAVENOUS
  Filled 2018-09-17: qty 4

## 2018-09-17 MED ORDER — SODIUM CHLORIDE 0.9% IV SOLUTION: Freq: Once | INTRAVENOUS | Status: DC

## 2018-09-17 MED ORDER — ACETAMINOPHEN 325 MG PO TABS
ORAL_TABLET | ORAL | Status: AC
  Filled 2018-09-17: qty 2

## 2018-09-17 MED ORDER — DIPHENHYDRAMINE HCL 25 MG PO CAPS
25.0000 mg | ORAL_CAPSULE | Freq: Once | ORAL | Status: AC
  Administered 2018-09-17: 25 mg via ORAL

## 2018-09-17 MED ORDER — DIPHENHYDRAMINE HCL 25 MG PO CAPS
ORAL_CAPSULE | ORAL | Status: AC
  Filled 2018-09-17: qty 1

## 2018-09-17 MED ORDER — ACETAMINOPHEN 325 MG PO TABS
650.0000 mg | ORAL_TABLET | Freq: Once | ORAL | Status: AC
  Administered 2018-09-17: 650 mg via ORAL

## 2018-09-17 NOTE — Progress Notes (Signed)
Patient made aware that appointment was going to show up as a 1:30 appointment in My Chart, as I do not have override capibilities.  She was advised to disregard and arrive at 7:30.

## 2018-09-18 ENCOUNTER — Other Ambulatory Visit (HOSPITAL_COMMUNITY): Payer: Self-pay | Admitting: Internal Medicine

## 2018-09-18 LAB — TYPE AND SCREEN
ABO/RH(D): O NEG
Antibody Screen: NEGATIVE
Unit division: 0
Unit division: 0

## 2018-09-18 LAB — BPAM RBC
Blood Product Expiration Date: 202001242359
Blood Product Expiration Date: 202001282359
ISSUE DATE / TIME: 201912310929
ISSUE DATE / TIME: 201912311203
Unit Type and Rh: 9500
Unit Type and Rh: 9500

## 2018-09-19 ENCOUNTER — Encounter (INDEPENDENT_AMBULATORY_CARE_PROVIDER_SITE_OTHER): Payer: PPO | Admitting: Ophthalmology

## 2018-09-19 ENCOUNTER — Other Ambulatory Visit (HOSPITAL_COMMUNITY): Payer: Self-pay | Admitting: Internal Medicine

## 2018-09-19 DIAGNOSIS — H43813 Vitreous degeneration, bilateral: Secondary | ICD-10-CM | POA: Diagnosis not present

## 2018-09-19 DIAGNOSIS — E113312 Type 2 diabetes mellitus with moderate nonproliferative diabetic retinopathy with macular edema, left eye: Secondary | ICD-10-CM | POA: Diagnosis not present

## 2018-09-19 DIAGNOSIS — E113391 Type 2 diabetes mellitus with moderate nonproliferative diabetic retinopathy without macular edema, right eye: Secondary | ICD-10-CM

## 2018-09-19 DIAGNOSIS — E11311 Type 2 diabetes mellitus with unspecified diabetic retinopathy with macular edema: Secondary | ICD-10-CM | POA: Diagnosis not present

## 2018-09-19 DIAGNOSIS — H35033 Hypertensive retinopathy, bilateral: Secondary | ICD-10-CM | POA: Diagnosis not present

## 2018-09-19 DIAGNOSIS — H2513 Age-related nuclear cataract, bilateral: Secondary | ICD-10-CM

## 2018-09-19 DIAGNOSIS — I10 Essential (primary) hypertension: Secondary | ICD-10-CM

## 2018-09-23 ENCOUNTER — Other Ambulatory Visit (INDEPENDENT_AMBULATORY_CARE_PROVIDER_SITE_OTHER): Payer: Self-pay | Admitting: *Deleted

## 2018-09-23 MED ORDER — PANTOPRAZOLE SODIUM 40 MG PO TBEC: 40.0000 mg | DELAYED_RELEASE_TABLET | Freq: Two times a day (BID) | ORAL | 3 refills | Status: DC

## 2018-10-02 DIAGNOSIS — F4323 Adjustment disorder with mixed anxiety and depressed mood: Secondary | ICD-10-CM | POA: Diagnosis not present

## 2018-10-09 ENCOUNTER — Other Ambulatory Visit (INDEPENDENT_AMBULATORY_CARE_PROVIDER_SITE_OTHER): Payer: Self-pay | Admitting: *Deleted

## 2018-10-09 DIAGNOSIS — F4323 Adjustment disorder with mixed anxiety and depressed mood: Secondary | ICD-10-CM | POA: Diagnosis not present

## 2018-10-09 DIAGNOSIS — K219 Gastro-esophageal reflux disease without esophagitis: Secondary | ICD-10-CM

## 2018-10-09 MED ORDER — PANTOPRAZOLE SODIUM 40 MG PO TBEC: 40.0000 mg | DELAYED_RELEASE_TABLET | Freq: Two times a day (BID) | ORAL | 3 refills | Status: DC

## 2018-10-10 NOTE — Unmapped (Signed)
Reason for call: Pt has been approved for Xifaxan 550 mg from [facility] Assistance    ??  Medication (Brand/Generic): Xifaxan  Patient Assistance Program: [facility]  Coverage Dates: 10/08/2018 through 09/18/2019  Phone number to set up delivery: [phone]    Called and spoke with pt and then her husband Kathlene November at phone number [phone]. Kathlene November took down notes with all the information provided and able to recall everything. Informed Kathlene November that his wife was approved for [facility] assistance thru [facility]. The Pharmacy that will be shipping the medication is [facility]. His wife is approved thru 09/18/19 and will need to complete a new application at the beginning of December. Provided the phone number to call and schedule delivery. Notified Kathlene November they usually send a 90 day supply and he will need to call and schedule refills when his wife is down to 20 pills (10 day supply). Kathlene November stated his wife has taken this medication before and is aware she will take one 550 mg tablet in the morning and one 550 mg tablet at night. Kathlene November instructed to call me if there is any issues scheduling shipment. They thanked me for the information and are very grateful for the assistance.     Vertell Limber RN, BSN  Nursing Care Coordinator   Pharmacy Adult GI Medicine  [facility]  [address]   [place], [place] [address]  [phone]

## 2018-10-14 ENCOUNTER — Other Ambulatory Visit (INDEPENDENT_AMBULATORY_CARE_PROVIDER_SITE_OTHER): Payer: Self-pay | Admitting: *Deleted

## 2018-10-14 ENCOUNTER — Telehealth (INDEPENDENT_AMBULATORY_CARE_PROVIDER_SITE_OTHER): Payer: Self-pay | Admitting: *Deleted

## 2018-10-14 DIAGNOSIS — K760 Fatty (change of) liver, not elsewhere classified: Secondary | ICD-10-CM

## 2018-10-14 DIAGNOSIS — K7460 Unspecified cirrhosis of liver: Secondary | ICD-10-CM

## 2018-10-14 NOTE — Telephone Encounter (Signed)
Call returned Patient advised to go back on torsemide 20 mg daily until she has lost 5 pounds and then she will take it Monday Wednesday and Friday. We will resume spironolactone at 50 mg daily. Lab work from this morning is pending.

## 2018-10-14 NOTE — Telephone Encounter (Signed)
Patient called the office and she states that she is swelling in her hands, ankles and feet. Her weight is up by 10 lbs. She doesn't feel that stopping the diuretics is the answer. When the patient called in on 10/09/2018 her weight was 187.2 , and she stated that it had been between this and 186. Per Dr.Rehman patient advised to stop the diuretics.  Her call back number is [phone].

## 2018-10-15 ENCOUNTER — Other Ambulatory Visit (INDEPENDENT_AMBULATORY_CARE_PROVIDER_SITE_OTHER): Payer: Self-pay | Admitting: *Deleted

## 2018-10-15 LAB — BASIC METABOLIC PANEL
BUN/Creatinine Ratio: 20 (ref 12–28)
BUN: 15 mg/dL (ref 8–27)
CO2: 19 mmol/L — ABNORMAL LOW (ref 20–29)
Calcium: 8.5 mg/dL — ABNORMAL LOW (ref 8.7–10.3)
Chloride: 111 mmol/L — ABNORMAL HIGH (ref 96–106)
Creatinine, Ser: 0.76 mg/dL (ref 0.57–1.00)
GFR calc Af Amer: 97 mL/min/{1.73_m2} (ref >59)
GFR calc non Af Amer: 84 mL/min/{1.73_m2} (ref >59)
Glucose: 195 mg/dL — ABNORMAL HIGH (ref 65–99)
Potassium: 4.2 mmol/L (ref 3.5–5.2)
Sodium: 142 mmol/L (ref 134–144)

## 2018-10-15 LAB — HEMOGLOBIN AND HEMATOCRIT, BLOOD
Hematocrit: 30.3 % — ABNORMAL LOW (ref 34.0–46.6)
Hemoglobin: 9.3 g/dL — ABNORMAL LOW (ref 11.1–15.9)

## 2018-10-16 ENCOUNTER — Encounter (INDEPENDENT_AMBULATORY_CARE_PROVIDER_SITE_OTHER): Payer: Self-pay | Admitting: *Deleted

## 2018-10-16 ENCOUNTER — Other Ambulatory Visit (INDEPENDENT_AMBULATORY_CARE_PROVIDER_SITE_OTHER): Payer: Self-pay | Admitting: *Deleted

## 2018-10-16 DIAGNOSIS — D649 Anemia, unspecified: Secondary | ICD-10-CM

## 2018-10-16 DIAGNOSIS — K7460 Unspecified cirrhosis of liver: Secondary | ICD-10-CM

## 2018-10-16 DIAGNOSIS — K760 Fatty (change of) liver, not elsewhere classified: Secondary | ICD-10-CM

## 2018-10-16 DIAGNOSIS — F4323 Adjustment disorder with mixed anxiety and depressed mood: Secondary | ICD-10-CM | POA: Diagnosis not present

## 2018-10-17 ENCOUNTER — Encounter (INDEPENDENT_AMBULATORY_CARE_PROVIDER_SITE_OTHER): Payer: PPO | Admitting: Ophthalmology

## 2018-10-17 DIAGNOSIS — H35033 Hypertensive retinopathy, bilateral: Secondary | ICD-10-CM

## 2018-10-17 DIAGNOSIS — E113291 Type 2 diabetes mellitus with mild nonproliferative diabetic retinopathy without macular edema, right eye: Secondary | ICD-10-CM

## 2018-10-17 DIAGNOSIS — E113312 Type 2 diabetes mellitus with moderate nonproliferative diabetic retinopathy with macular edema, left eye: Secondary | ICD-10-CM | POA: Diagnosis not present

## 2018-10-17 DIAGNOSIS — I10 Essential (primary) hypertension: Secondary | ICD-10-CM

## 2018-10-17 DIAGNOSIS — H2513 Age-related nuclear cataract, bilateral: Secondary | ICD-10-CM

## 2018-10-17 DIAGNOSIS — E11311 Type 2 diabetes mellitus with unspecified diabetic retinopathy with macular edema: Secondary | ICD-10-CM

## 2018-10-17 DIAGNOSIS — H43813 Vitreous degeneration, bilateral: Secondary | ICD-10-CM

## 2018-10-17 DIAGNOSIS — R0609 Other forms of dyspnea: Secondary | ICD-10-CM | POA: Diagnosis not present

## 2018-10-21 DIAGNOSIS — E113291 Type 2 diabetes mellitus with mild nonproliferative diabetic retinopathy without macular edema, right eye: Secondary | ICD-10-CM | POA: Diagnosis not present

## 2018-10-21 DIAGNOSIS — E083312 Diabetes mellitus due to underlying condition with moderate nonproliferative diabetic retinopathy with macular edema, left eye: Secondary | ICD-10-CM | POA: Diagnosis not present

## 2018-10-21 DIAGNOSIS — H40013 Open angle with borderline findings, low risk, bilateral: Secondary | ICD-10-CM | POA: Diagnosis not present

## 2018-10-21 DIAGNOSIS — H2513 Age-related nuclear cataract, bilateral: Secondary | ICD-10-CM | POA: Diagnosis not present

## 2018-10-29 ENCOUNTER — Ambulatory Visit
Admit: 2018-10-29 | Discharge: 2018-10-30 | Payer: PRIVATE HEALTH INSURANCE | Attending: Psychiatry | Primary: Psychiatry

## 2018-10-29 DIAGNOSIS — F411 Generalized anxiety disorder: Principal | ICD-10-CM

## 2018-10-29 MED ORDER — BUSPIRONE 10 MG TABLET
ORAL_TABLET | Freq: Two times a day (BID) | ORAL | 3 refills | 0.00000 days | Status: CP
Start: 2018-10-29 — End: 2018-12-04

## 2018-10-29 MED ORDER — SERTRALINE 50 MG TABLET
ORAL_TABLET | 0 refills | 0 days | Status: CP
Start: 2018-10-29 — End: 2018-11-12
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
## 2018-10-29 NOTE — Unmapped (Signed)
Follow-up instructions:  -- Please continue taking your medications as prescribed for your mental health.   -- Do not make changes to your medications, including taking more or less than prescribed, unless under the supervision of your physician. Be aware that some medications may make you feel worse if abruptly stopped  -- Please refrain from using illicit substances, as these can affect your mood and could cause anxiety or other concerning symptoms.   -- Seek further medical care for any increase in symptoms or new symptoms such as thoughts of wanting to hurt yourself or hurt others.     Contact info:  Life-threatening emergencies: call 911 or go to the nearest ER for medical or psychiatric attention.     Issues that need urgent attention but are not life threatening: call the clinic outpatient frontdesk at [phone] for assistance.     Non-urgent routine concerns, questions, and refill requests: please leave me a voicemail at [phone] and I will get back to you within 2 business days.     Regarding appointments:  - If you need to cancel your appointment, we ask that you call [phone] at least 24 hours before your scheduled appointment.  - If for any reason you arrive 15 minutes later than your scheduled appointment time, you may not be seen and your visit may be rescheduled.  - Please remember that we will not automatically reschedule missed appointments.  - If you miss two (2) appointments without letting us know in advance, you will likely be referred to a provider in your community.  - We will do our best to be on time. Sometimes an emergency will arise that might cause your clinician to be late. We will try to inform you of this when you check in for your appointment. If you wait more than 15 minutes past your appointment time without such notice, please speak with the front desk staff.    In the event of bad weather, the clinic staff will attempt to contact you, should your appointment need to be rescheduled. Additionally, you can call the Patient Weather Line [phone] for system-wide clinic status    For more information and reminders regarding clinic policies (these were provided when you were admitted to the clinic), please ask the front desk.    Zoloft:  Please take 1/2 tablet (25mg ) daily by mouth for 4 days, then increase to 1 tablet (50mg ) by mouth daily.    Buspar:  Please increase dose to 1 tablet (10mg ) by mouth daily.

## 2018-10-30 DIAGNOSIS — F4323 Adjustment disorder with mixed anxiety and depressed mood: Secondary | ICD-10-CM | POA: Diagnosis not present

## 2018-10-30 NOTE — Unmapped (Signed)
I saw and evaluated the patient, participating in the key portions of the service.?? I reviewed the resident???s note.?? I agree with the resident???s findings and plan. Zoloft titration. Now off paxil. Orie Fisherman, MD

## 2018-11-01 DIAGNOSIS — E2839 Other primary ovarian failure: Secondary | ICD-10-CM | POA: Diagnosis not present

## 2018-11-07 ENCOUNTER — Other Ambulatory Visit (INDEPENDENT_AMBULATORY_CARE_PROVIDER_SITE_OTHER): Payer: Self-pay | Admitting: *Deleted

## 2018-11-07 MED ORDER — POTASSIUM CHLORIDE ER 10 MEQ PO TBCR: 10.0000 meq | EXTENDED_RELEASE_TABLET | Freq: Two times a day (BID) | ORAL | 3 refills | Status: DC

## 2018-11-12 MED ORDER — SERTRALINE 50 MG TABLET
ORAL_TABLET | Freq: Every day | ORAL | 1 refills | 0.00000 days | Status: CP
Start: 2018-11-12 — End: 2018-12-04

## 2018-11-12 NOTE — Unmapped (Signed)
Called patient to discuss efficacy and side effects since recently initiating Zoloft.  She stated she has not felt any improvement after starting ~2 weeks ago, though has also not had any adverse effects.  She stated that she is feeling generally fine.  Will refill Zoloft at 50mg  daily.

## 2018-11-13 DIAGNOSIS — D649 Anemia, unspecified: Secondary | ICD-10-CM | POA: Diagnosis not present

## 2018-11-13 DIAGNOSIS — K760 Fatty (change of) liver, not elsewhere classified: Secondary | ICD-10-CM | POA: Diagnosis not present

## 2018-11-13 DIAGNOSIS — F4323 Adjustment disorder with mixed anxiety and depressed mood: Secondary | ICD-10-CM | POA: Diagnosis not present

## 2018-11-13 DIAGNOSIS — K7460 Unspecified cirrhosis of liver: Secondary | ICD-10-CM | POA: Diagnosis not present

## 2018-11-14 ENCOUNTER — Ambulatory Visit
Admit: 2018-11-14 | Discharge: 2018-11-15 | Payer: PRIVATE HEALTH INSURANCE | Attending: Health Service | Primary: Health Service

## 2018-11-14 ENCOUNTER — Ambulatory Visit
Admit: 2018-11-14 | Discharge: 2018-11-15 | Payer: PRIVATE HEALTH INSURANCE | Attending: Student in an Organized Health Care Education/Training Program | Primary: Student in an Organized Health Care Education/Training Program

## 2018-11-14 ENCOUNTER — Ambulatory Visit: Admit: 2018-11-14 | Discharge: 2018-11-15 | Payer: PRIVATE HEALTH INSURANCE

## 2018-11-14 ENCOUNTER — Ambulatory Visit
Admit: 2018-11-14 | Discharge: 2018-11-15 | Payer: PRIVATE HEALTH INSURANCE | Attending: Internal Medicine | Primary: Internal Medicine

## 2018-11-14 DIAGNOSIS — K7290 Hepatic failure, unspecified without coma: Principal | ICD-10-CM

## 2018-11-14 DIAGNOSIS — N186 End stage renal disease: Principal | ICD-10-CM

## 2018-11-14 DIAGNOSIS — Z95828 Presence of other vascular implants and grafts: Secondary | ICD-10-CM

## 2018-11-14 DIAGNOSIS — F3289 Other specified depressive episodes: Secondary | ICD-10-CM

## 2018-11-14 DIAGNOSIS — F411 Generalized anxiety disorder: Principal | ICD-10-CM

## 2018-11-14 DIAGNOSIS — Z992 Dependence on renal dialysis: Secondary | ICD-10-CM

## 2018-11-14 DIAGNOSIS — K7460 Unspecified cirrhosis of liver: Principal | ICD-10-CM

## 2018-11-14 DIAGNOSIS — Z01818 Encounter for other preprocedural examination: Secondary | ICD-10-CM

## 2018-11-14 LAB — CBC W/ AUTO DIFF
BASOPHILS ABSOLUTE COUNT: 0 10*9/L (ref 0.0–0.1)
BASOPHILS RELATIVE PERCENT: 1.2 %
EOSINOPHILS ABSOLUTE COUNT: 0.1 10*9/L (ref 0.0–0.4)
EOSINOPHILS RELATIVE PERCENT: 2.9 %
HEMOGLOBIN: 8.3 g/dL — ABNORMAL LOW (ref 12.0–16.0)
LARGE UNSTAINED CELLS: 5 % — ABNORMAL HIGH (ref 0–4)
LYMPHOCYTES ABSOLUTE COUNT: 0.8 10*9/L — ABNORMAL LOW (ref 1.5–5.0)
LYMPHOCYTES RELATIVE PERCENT: 31.7 %
MEAN CORPUSCULAR HEMOGLOBIN CONC: 29.3 g/dL — ABNORMAL LOW (ref 31.0–37.0)
MEAN CORPUSCULAR HEMOGLOBIN: 23.8 pg — ABNORMAL LOW (ref 26.0–34.0)
MEAN CORPUSCULAR VOLUME: 81.1 fL (ref 80.0–100.0)
MEAN PLATELET VOLUME: 13.7 fL — ABNORMAL HIGH (ref 7.0–10.0)
MONOCYTES ABSOLUTE COUNT: 0.2 10*9/L (ref 0.2–0.8)
MONOCYTES RELATIVE PERCENT: 7.7 %
NEUTROPHILS RELATIVE PERCENT: 51.7 %
PLATELET COUNT: 53 10*9/L — ABNORMAL LOW (ref 150–440)
RED BLOOD CELL COUNT: 3.5 10*12/L — ABNORMAL LOW (ref 4.00–5.20)
RED CELL DISTRIBUTION WIDTH: 17 % — ABNORMAL HIGH (ref 12.0–15.0)
WBC ADJUSTED: 2.6 10*9/L — ABNORMAL LOW (ref 4.5–11.0)

## 2018-11-14 LAB — POLYCHROMASIA

## 2018-11-14 LAB — COMPREHENSIVE METABOLIC PANEL
ALBUMIN: 2.7 g/dL — ABNORMAL LOW (ref 3.5–5.0)
ALKALINE PHOSPHATASE: 102 U/L (ref 38–126)
ANION GAP: 8 mmol/L (ref 7–15)
AST (SGOT): 34 U/L (ref 14–38)
BILIRUBIN TOTAL: 2.1 mg/dL — ABNORMAL HIGH (ref 0.0–1.2)
BLOOD UREA NITROGEN: 12 mg/dL (ref 7–21)
BUN / CREAT RATIO: 18
CALCIUM: 8.7 mg/dL (ref 8.5–10.2)
CHLORIDE: 111 mmol/L — ABNORMAL HIGH (ref 98–107)
CO2: 22 mmol/L (ref 22.0–30.0)
CREATININE: 0.67 mg/dL (ref 0.60–1.00)
EGFR CKD-EPI AA FEMALE: >90 mL/min/{1.73_m2} (ref >=60)
EGFR CKD-EPI NON-AA FEMALE: >90 mL/min/{1.73_m2} (ref >=60)
GLUCOSE RANDOM: 132 mg/dL (ref 70–179)
POTASSIUM: 3.9 mmol/L (ref 3.5–5.0)
PROTEIN TOTAL: 6.1 g/dL — ABNORMAL LOW (ref 6.5–8.3)
SODIUM: 141 mmol/L (ref 135–145)

## 2018-11-14 LAB — IRON PANEL
IRON: 30 ug/dL — ABNORMAL LOW (ref 35–165)
TOTAL IRON BINDING CAPACITY (CALC): 387.5 mg/dL (ref 252.0–479.0)

## 2018-11-14 LAB — IRON: Iron:MCnc:Pt:Ser/Plas:Qn:: 30 — ABNORMAL LOW

## 2018-11-14 LAB — FERRITIN: Ferritin:MCnc:Pt:Ser/Plas:Qn:: 7

## 2018-11-14 LAB — MEAN PLATELET VOLUME: Lab: 13.7 — ABNORMAL HIGH

## 2018-11-14 LAB — ALT (SGPT): Alanine aminotransferase:CCnc:Pt:Ser/Plas:Qn:: 24

## 2018-11-14 LAB — INR: Lab: 1.66

## 2018-11-14 LAB — HEMOGLOBIN AND HEMATOCRIT, BLOOD
Hematocrit: 27.3 % — ABNORMAL LOW (ref 34.0–46.6)
Hemoglobin: 8 g/dL — ABNORMAL LOW (ref 11.1–15.9)

## 2018-11-14 NOTE — Unmapped (Signed)
Transplant Surgery History and Physical      Assessment/Recommendations:    Marilyn French is a 63 y.o. female seen in consultation at the request of Collie Siad, MD for evaluation of candidacy for transplantation.    I spent 30 minutes with the patient obtaining the above history and physical examination, and greater than 50% of the time was spent counseling and on the substance of the discussion.    Today we discussed liver transplantation going over the surgery to be performed, the hospital course including length of stay, anti-rejection medications and their side effects, results and the cadaveric donor system.    I discussed in detail with Marilyn French the risks and benefits of liver transplantation, including but not limited to: the general anesthetic, monitoring lines, the incision, the hepatectomy, as well as reimplantation of the liver graft and immunosuppressant medications. In regards to the surgical procedure, we noted that it is a major operation performed under general anesthesia with the risks of heart attack, stroke and death. Multiple invasive means of monitoring may be necessary during the operation including an arterial line, a central venous catheter, a foley catheter inserted into the bladder, and a tube from your nose into your stomach to prevent stomach distension. After surgery, the patient will go to the Intensive Care Unit and is then sent to the regular floor when medically stable. I discussed the possible complications including the need for reoperation for bleeding, infection or other complications, the possibility of clotting/leakage of blood vessels, requiring either radiological intervention, surgical intervention, or even retransplantation. I reviewed the possibility of complications involving the biliary tract including leaks, strictures and need for retransplantation for biliary complications. I discussed the possibility of primary nonfunction of the liver graft requiring urgent retransplantation or the result of death. The patient understands the need for long-term immunosuppression therapy as well as monitoring of labs and immunosuppression. Anti-rejection medications, including Prograf or Cyclosporine (Neoral), Cellcept, steroids and others, will be needed after transplantation and for the patient???s entire lifetime. Problems include infection, cancer, hirsutism, tremors, gum swelling, hypertension, bone fractures, aggravation of diabetes or new onset diabetes, cataracts, and rashes. Finally, the donor system was reviewed. All donors are tested for infections and other diseases, but there is a small chance of transmission of diseases including viruses as well as the possible transmission of tumors. Some patients may elect to receive a liver from a donor who was exposed to the Hepatitis B or Hepatitis C virus and the recipient may require certain anti-viral medications to prevent this virus from damaging the new liver.    Finally, I reviewed with the patient how the surgery is expected to improve their health and quality of life, that the average length of hospitalization stay is 10-12 days, and that the length of their expected recovery period, including when normal daily activities may be resumed, will be patient dependent.    Marilyn French had all their questions answered and wishes to proceed with the liver transplant evaluation process.    This patient was seen and evaluated with Dr. Matilde Haymaker with no contraindication for transplant from a surgical perspective.  Recommend MRI to reevaluate for portal vein thrombus especially since patient is at high risk for bleeding with anticoagulation.     HPI  63 year old female with NAFLD cirrhosis who presents for reevaluation for liver transplant. She was last seen 08/08/18 with no contraindications for surgery but pending evaluation of her TIPS in the setting of portal vein thrombus.  She has had multiple episodes of GI bleeding due to varices/ GAVE which have been treated by banding. She underwent TIPS 07/08/18. She had a portal vein thrombus diagnosed 12/18 and has not been on anticoagulation due to bleeding risk. She is being managed with lactulose, torsemide, and spironolactone. She has not had recent bleeding episodes (last April 2018) or encephalopathy. Has not required paracentesis other than during TIPS.     Last MELD (labs 07/2018) 16.        Allergies    Ferumoxytol and Tramadol hcl      Medications      Current Outpatient Medications   Medication Sig Dispense Refill   ??? busPIRone (BUSPAR) 10 MG tablet Take 1 tablet (10 mg total) by mouth two (2) times a day. 60 tablet 3   ??? cholecalciferol, vitamin D3, (CHOLECALCIFEROL) 1,000 unit tablet Take 1,000 Units by mouth Mon,Wed,Fri (2000).     ??? insulin ASPART (NOVOLOG FLEXPEN U-100 INSULIN) 100 unit/mL injection pen Use with given sliding scale    CBG 70-120: 0 units  CBG 121-150: 1 unit  CBG 151-200: 2 units  CBG 201-250: 3 units  CBG 251-300: 5 units  CBG 301-350: 7 units  CBG greater than 351: 9 units     ??? insulin glargine (LANTUS SOLOSTAR U-100 INSULIN) 100 unit/mL (3 mL) injection pen Inject 0.35 mL (35 Units total) under the skin daily. (Patient taking differently: Inject 45 Units under the skin daily. ) 10.5 mL 0   ??? lactulose (CHRONULAC) 10 gram/15 mL solution Take 20 g by mouth Four (4) times a day.      ??? levothyroxine (SYNTHROID, LEVOTHROID) 25 MCG tablet Take 25 mcg by mouth daily.     ??? magnesium oxide (MAG-OX) 400 mg (241.3 mg magnesium) tablet Take 800 mg by mouth daily. Take for hand and/or leg cramping.      ??? OXYGEN-AIR DELIVERY SYSTEMS MISC Inhale 2 L nightly.     ??? pantoprazole (PROTONIX) 40 MG tablet Take 40 mg by mouth daily.     ??? pediatric multivit-iron-min (FLINTSTONES COMPLETE) tablet Chew 2 tablets daily.      ??? pen needle, diabetic 31 gauge x 1/4 Ndle      ??? potassium chloride SA (K-DUR,KLOR-CON) 20 MEQ tablet Take 20 mEq by mouth daily.      ??? rifAXIMin (XIFAXAN) 550 mg Tab Take 1 tablet (550 mg total) by mouth Two (2) times a day. 60 tablet 11   ??? rosuvastatin (CRESTOR) 5 MG tablet Take 5 mg by mouth every other day.     ??? sertraline (ZOLOFT) 50 MG tablet Take 1 tablet (50 mg total) by mouth daily. Please take 1/2 tablet (25mg ) daily by mouth for 4 days, then increase to 1 tablet (50mg ) by mouth daily. 30 tablet 1   ??? spironolactone (ALDACTONE) 50 MG tablet Take 1 tablet (50 mg total) by mouth daily. 30 tablet 0   ??? torsemide (DEMADEX) 20 MG tablet Take 20 mg by mouth daily.       No current facility-administered medications for this visit.          Past Medical History    Past Medical History:   Diagnosis Date   ??? Cirrhosis (CMS-HCC)    ??? Depression    ??? Diabetes mellitus (CMS-HCC)    ??? Fibromyalgia    ??? GAVE (gastric antral vascular ectasia)    ??? HTN (hypertension)    ??? Hypercholesterolemia    ??? Hypothyroid    ??? NAFLD (  nonalcoholic fatty liver disease)    ??? Osteoporosis          Past Surgical History    Past Surgical History:   Procedure Laterality Date   ??? APPENDECTOMY     ??? CHOLECYSTECTOMY     ??? IR TIPS  07/08/2018    IR TIPS 07/08/2018 Soledad Gerlach, MD IMG VIR [facility]   ??? PR RIGHT HEART CATH O2 SATURATION & CARDIAC OUTPUT Right 01/18/2018    Procedure: Right Heart Catheterization;  Surgeon: Marlaine Hind, MD;  Location: [facility] CATH;  Service: Cardiology   ??? SALPINGOOPHORECTOMY           Family History    Mother: Died at 73 with lung cancer, smoker. ??Father: Died at 44 with COPD and bladder cancer, smoker. ??Brother: Died of colorectal cancer. ??Brother alive with possible alcoholic liver disease. ??Sisters x 3 alive with diabetes.      Social History:    Alcohol/tobacco/drugs - denies      Review of Systems    A 12 system review of systems was negative except as noted in HPI    Objective     PE: There were no vitals taken for this visit. There is no height or weight on file to calculate BMI.  General: no distress, well appearing  Lungs: nonlabored breathing on room air  Heart: normal rate regular rhythm  Abd: soft nontender nondistended  Ascites: none  Skin: no rashes or lesions  Ext: warm well perfused   Neuro: non-focal exam.         Test Results    Labs:  Labs today pending    Imaging:   TIPS ultrasound 11/14/18  Heterogeneous, small liver, likely related to cirrhosis.   Patent TIPS.  Normal flow direction in the main portal vein and TIPS. Limited evaluation of the right and left portal veins, obscured by background arterial flow.

## 2018-11-14 NOTE — Unmapped (Signed)
Ms. Buechler,  ??  It was a pleasure meeting you on 11/14/18.  Below is the list of recommendations and plan we discussed in our visit. Don't hesitate to contact me with any questions.    Best,  Robert Bellow, Ph.D.  Clinical Psychologist  [facility]  [phone]      1. Please call me with the name and number of your counselor/therapist, and please let her know I will be contacting her to coordinate your care.     2. Continue regular, weekly attendance to therapy. Continue follow-up with [facility] for medication management.     3. Begin using a pillbox that you fill weekly with your husband's help. Please allow him to check behind you each day that you have taken your medications, including pills, lactulose, and insulin.    4. Work to adhere more closely to your dietary restrictions, including low sugar and low sodium. Please also try using a food log, in which you and your family writes down all that you eat to better track when/what you have eaten given your report of forgetfulness which can affect your BS levels and insulin dosing.    5. Increase communication with your husband and other family, to better support your medical regimen adherence and emotional support.     6. Consider keeping a calendar that you and your husband can both access to keep better track of your appointments.     7. Have a family member attend all medical appointments with you, and have them take notes of any changes or instructions from your providers. Be sure that your support at home is aware of any changes.

## 2018-11-14 NOTE — Unmapped (Addendum)
It was good to see you today.  - Increase lactulose to three times a day, for 3-4 bowel movements a day.  Take extra dose every 2 hours until clear. Continue Rifaximin  - continue diuretic dose (torsemide 20mg  daily, aldactone 50mg  daily)  - lab check today  - follow up with surgeons today  - continue depression medications  - will discuss with the entire team your liver transplant status next week    Marilyn French. Maren Beach, MD MPH  Gastroenterology Fellow

## 2018-11-15 ENCOUNTER — Other Ambulatory Visit (INDEPENDENT_AMBULATORY_CARE_PROVIDER_SITE_OTHER): Payer: Self-pay | Admitting: *Deleted

## 2018-11-15 DIAGNOSIS — I8501 Esophageal varices with bleeding: Secondary | ICD-10-CM

## 2018-11-15 DIAGNOSIS — K760 Fatty (change of) liver, not elsewhere classified: Secondary | ICD-10-CM

## 2018-11-15 DIAGNOSIS — K7460 Unspecified cirrhosis of liver: Secondary | ICD-10-CM

## 2018-11-15 DIAGNOSIS — D649 Anemia, unspecified: Secondary | ICD-10-CM

## 2018-11-15 NOTE — Progress Notes (Signed)
Labs have been ordered

## 2018-11-16 DIAGNOSIS — R0609 Other forms of dyspnea: Secondary | ICD-10-CM | POA: Diagnosis not present

## 2018-11-16 NOTE — Unmapped (Signed)
[facility], [facility], [address]  [place], [place]  [address]  Ph: [phone]  Fax: [phone]    11/16/2018    Patient Care Team:  Ignatius Specking, MD as PCP - General  Carollee Massed, MD as Referring Physician  Janet Berlin, RN as Transplant Coordinator (Transplant)  Coralee Rud, MSW as Social Worker (Transplant)  Elease Etienne as Transplant Financial Coordinator (Transplant)  Jenel Lucks, MD as Resident (Psychiatry)  Janyth Pupa, MD as Transplant Hepatologist (Gastroenterology)    RE: Marilyn French; DOB: [birth year]      Reason for visit: Follow-up for cirrhosis secondary to nonalcoholic fatty liver disease    HPI: Ms. Marilyn French is a pleasant 63 year old Caucasian female with history of cirrhosis secondary to NAFLD.  She is currently undergoing transplant evaluation.  She was last seen hepatology clinic on 07/02/2018.  At that clinic visit patient's hemoglobin was 6.4.  She was hospitalized locally for blood transfusion.  Her most recent meld score is 18.  Blood group O-.  Complications of her liver disease include variceal bleed, ascites and hepatic encephalopathy.  Patient states that she underwent an EGD locally in 12/2017.  She does have a history of GAVE as well and did require APC per patient report.  The official report is not available for review.  In regards to encephalopathy she is currently on lactulose and rifaximin. She was hospitalized in 07/2019 for encephalopathy.  She is achieving 3???5 bowel movements a day.  Her ascites is currently being managed with torsemide 20 mg daily and spironolactone 50 mg daily.  She has not required any LVP's.  She did undergo a right heart catheterization in 01/2018 at [facility] which showed no evidence for pulmonary hypertension.  She is also seen [facility] pulmonary.  She underwent a DEXA scan on 12/25/2017.  An MRI from 12/25/2017 showed no evidence for [facility].  There is questionable nonocclusive portal vein thrombus. Since her last clinic visit patient did undergo a repeat MRI and CT on 03/28/2018.  Her MRI was reviewed at [facility] conference and felt the PVT persists and is concerning for progression, worsening portal htn and compromising her transplant candidacy by loss of PV and SMV.  Anti-coagulation is not a good option due to her chronic PHG/GAVE blood loss.  We recommended proceeding with a TIPS.  She underwent a TIPS on 07/08/2018.  She also underwent embolization of mesocaval varices.  Her hepatic venous pressure gradient went from 10 to 2 mm Hg.  Since her TIPS she was hospitalized for hepatic encephalopathy on 07/26/2018 and now started on Xifaxan.  She is immune to hepatitis A and B.    Since her hospitalization in 07/2019 she has not had further episodes of encephalopathy requiring hospitalizations but has intermittently been confused at home that resolves with extra lactulose.  VIR has recommended closure of additional mesocaval shunts. Has a TIPS doppler pending today. Was told to stop Paxil by local GI doctor and had severe increased depression/anxiety. Now seeing psychiatry again and is stable on Buspar.    Has chronic anemia on po iron tablets. Has intermittent dark stools. No BRBPR or hematemesis. Last EGD 12/2017 with GAVE requiring APC.    MELD-Na score: 15 at 11/14/2018  1:50 PM  MELD score: 15 at 11/14/2018  1:50 PM  Calculated from:  Serum Creatinine: 0.67 mg/dL (Rounded to 1 mg/dL) at 09/20/7251  6:64 PM  Serum Sodium: 141 mmol/L (Rounded to 137 mmol/L)  at 11/14/2018  1:50 PM  Total Bilirubin: 2.1 mg/dL at 1/61/0960  4:54 PM  INR(ratio): 1.66 at 11/14/2018  1:50 PM  Age: 63 years        PMH:  Patient Active Problem List   Diagnosis   ??? Abnormal echocardiogram   ??? NAFLD (nonalcoholic fatty liver disease)   ??? Cirrhosis (CMS-HCC)   ??? GAVE (gastric antral vascular ectasia)   ??? Hypothyroid   ??? Chronic blood loss anemia   ??? Generalized anxiety disorder     Past Medical History:   Diagnosis Date   ??? Cirrhosis (CMS-HCC)    ??? Depression    ??? Diabetes mellitus (CMS-HCC)    ??? Fibromyalgia    ??? GAVE (gastric antral vascular ectasia)    ??? HTN (hypertension)    ??? Hypercholesterolemia    ??? Hypothyroid    ??? NAFLD (nonalcoholic fatty liver disease)    ??? Osteoporosis        PSH:  Past Surgical History:   Procedure Laterality Date   ??? APPENDECTOMY     ??? CHOLECYSTECTOMY     ??? IR TIPS  07/08/2018    IR TIPS 07/08/2018 Soledad Gerlach, MD IMG VIR [facility]   ??? PR RIGHT HEART CATH O2 SATURATION & CARDIAC OUTPUT Right 01/18/2018    Procedure: Right Heart Catheterization;  Surgeon: Marlaine Hind, MD;  Location: [facility] CATH;  Service: Cardiology   ??? SALPINGOOPHORECTOMY         MEDICATIONS:  Current Outpatient Medications   Medication Sig Dispense Refill   ??? busPIRone (BUSPAR) 10 MG tablet Take 1 tablet (10 mg total) by mouth two (2) times a day. 60 tablet 3   ??? cholecalciferol, vitamin D3, (CHOLECALCIFEROL) 1,000 unit tablet Take 1,000 Units by mouth Mon,Wed,Fri (2000).     ??? insulin ASPART (NOVOLOG FLEXPEN U-100 INSULIN) 100 unit/mL injection pen Use with given sliding scale    CBG 70-120: 0 units  CBG 121-150: 1 unit  CBG 151-200: 2 units  CBG 201-250: 3 units  CBG 251-300: 5 units  CBG 301-350: 7 units  CBG greater than 351: 9 units     ??? insulin glargine (LANTUS SOLOSTAR U-100 INSULIN) 100 unit/mL (3 mL) injection pen Inject 0.35 mL (35 Units total) under the skin daily. (Patient taking differently: Inject 45 Units under the skin daily. ) 10.5 mL 0   ??? lactulose (CHRONULAC) 10 gram/15 mL solution Take 20 g by mouth Four (4) times a day.      ??? levothyroxine (SYNTHROID, LEVOTHROID) 25 MCG tablet Take 25 mcg by mouth daily.     ??? magnesium oxide (MAG-OX) 400 mg (241.3 mg magnesium) tablet Take 800 mg by mouth daily. Take for hand and/or leg cramping.      ??? OXYGEN-AIR DELIVERY SYSTEMS MISC Inhale 2 L nightly.     ??? pantoprazole (PROTONIX) 40 MG tablet Take 40 mg by mouth daily.     ??? pediatric multivit-iron-min (FLINTSTONES COMPLETE) tablet Chew 2 tablets daily.      ??? pen needle, diabetic 31 gauge x 1/4 Ndle      ??? potassium chloride SA (K-DUR,KLOR-CON) 20 MEQ tablet Take 20 mEq by mouth daily.      ??? rifAXIMin (XIFAXAN) 550 mg Tab Take 1 tablet (550 mg total) by mouth Two (2) times a day. 60 tablet 11   ??? rosuvastatin (CRESTOR) 5 MG tablet Take 5 mg by mouth every other day.     ??? sertraline (ZOLOFT) 50 MG tablet Take  1 tablet (50 mg total) by mouth daily. Please take 1/2 tablet (25mg ) daily by mouth for 4 days, then increase to 1 tablet (50mg ) by mouth daily. 30 tablet 1   ??? spironolactone (ALDACTONE) 50 MG tablet Take 1 tablet (50 mg total) by mouth daily. 30 tablet 0   ??? torsemide (DEMADEX) 20 MG tablet Take 20 mg by mouth daily.       No current facility-administered medications for this visit.        ALLERGIES:  Ferumoxytol and Tramadol hcl    CIRRHOSIS CARE:  1. EGD: s/p TIPS  2. Imaging:??11/14/2018 TIPS Doppler pedning  3. Vaccination for hepatitis A and B:??Immune to A and B  4. SBP prophylaxis [criteria: h/o SBP, or CP ???9 w/ bili ???3, renal insufficiency, or Na ???130]: Not applicable  5. Bone health: 12/25/2017  6. Nutrition: wt & exercise mgmnt, low Na intake and bedtime snack discussed.  7. OTC agents: proper use of acetaminophen and avoidance of NSAIA's & HDS products discussed.  8. Transplant:??Undergoing evaluation   ??  FH: Mother: Died at 58 with lung cancer, smoker. ??Father: Died at 71 with COPD and bladder cancer, smoker. ??Brother: Died of colorectal cancer. ??Brother alive with possible alcoholic liver disease. ??Sisters x 3 alive with diabetes  ??  SH:????Patient was born and raised in [place], where she still resides. ??She has been married for 45 years. ??She is a disabled Charity fundraiser. 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 ??Her spouse is a retired/disabled Psychiatric nurse. ??She has a daughter who is a Sport and exercise psychologist.  ??  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 ??She has never used any tobacco products. ??She denies all illicit drug use.      RoS: Negative on 10 systems review other than what is mentioned above.    PE: Blood pressure 120/56, pulse 68, temperature 36.3 ??C, temperature source Temporal, weight 88 kg (193 lb 14.4 oz), SpO2 100 %. Body mass index is 35.46 kg/m??.;  WDWN, no acute distress; No scleral icterus; no xanthelasma; oropharynx clear; neck supple without lymphadenopathy, carotid bruits or jugular venous distention; Lungs were clear to the bases by auscultation and percussion; Heart: regular rate and rhythm, normal S1, S2 without significant murmur or gallop.  Extremities: no clubbing, edema or palmer erythema.  Skin: no spider angiomata, no rash; Neuropsych: normal affect, speech pattern and cognitive function; oriented x 3; no asterixis.    LABS:  MELD-Na score: 15 at 11/14/2018  1:50 PM  MELD score: 15 at 11/14/2018  1:50 PM  Calculated from:  Serum Creatinine: 0.67 mg/dL (Rounded to 1 mg/dL) at 1/61/0960  4:54 PM  Serum Sodium: 141 mmol/L (Rounded to 137 mmol/L) at 11/14/2018  1:50 PM  Total Bilirubin: 2.1 mg/dL at 0/98/1191  4:78 PM  INR(ratio): 1.66 at 11/14/2018  1:50 PM  Age: 63 years    All lab results last 72 hours:    Recent Results (from the past 72 hour(s))   Iron Panel    Collection Time: 11/14/18  1:50 PM   Result Value Ref Range    Iron 30 (L) 35 - 165 ug/dL    TIBC 295.6 213.0 - 865.7 mg/dL    Transferrin 846.9 629.5 - 380.0 mg/dL    Iron Saturation (%) 8 (L) 15 - 50 %   Ferritin    Collection Time: 11/14/18  1:50 PM   Result Value Ref Range    Ferritin 7.0 3.0 - 151.0 ng/mL  PT-INR    Collection Time: 11/14/18  1:50 PM   Result Value Ref Range    PT 19.3 (H) 10.2 - 13.1 sec    INR 1.66    Comprehensive metabolic panel    Collection Time: 11/14/18  1:50 PM   Result Value Ref Range    Sodium 141 135 - 145 mmol/L    Potassium 3.9 3.5 - 5.0 mmol/L    Chloride 111 (H) 98 - 107 mmol/L    Anion Gap 8 7 - 15 mmol/L    CO2 22.0 22.0 - 30.0 mmol/L    BUN 12 7 - 21 mg/dL    Creatinine 1.61 0.96 - 1.00 mg/dL    BUN/Creatinine Ratio 18     EGFR CKD-EPI Non-African American, Female >90 >=60 mL/min/1.51m2    EGFR CKD-EPI African American, Female >90 >=60 mL/min/1.44m2    Glucose 132 70 - 179 mg/dL    Calcium 8.7 8.5 - 04.5 mg/dL    Albumin 2.7 (L) 3.5 - 5.0 g/dL    Total Protein 6.1 (L) 6.5 - 8.3 g/dL    Total Bilirubin 2.1 (H) 0.0 - 1.2 mg/dL    AST 34 14 - 38 U/L    ALT 24 <35 U/L    Alkaline Phosphatase 102 38 - 126 U/L   CBC w/ Differential    Collection Time: 11/14/18  1:50 PM   Result Value Ref Range    WBC 2.6 (L) 4.5 - 11.0 10*9/L    RBC 3.50 (L) 4.00 - 5.20 10*12/L    HGB 8.3 (L) 12.0 - 16.0 g/dL    HCT 40.9 (L) 81.1 - 46.0 %    MCV 81.1 80.0 - 100.0 fL    MCH 23.8 (L) 26.0 - 34.0 pg    MCHC 29.3 (L) 31.0 - 37.0 g/dL    RDW 91.4 (H) 78.2 - 15.0 %    MPV 13.7 (H) 7.0 - 10.0 fL    Platelet 53 (L) 150 - 440 10*9/L    Variable HGB Concentration Moderate (A) Not Present    Neutrophils % 51.7 %    Lymphocytes % 31.7 %    Monocytes % 7.7 %    Eosinophils % 2.9 %    Basophils % 1.2 %    Absolute Neutrophils 1.4 (L) 2.0 - 7.5 10*9/L    Absolute Lymphocytes 0.8 (L) 1.5 - 5.0 10*9/L    Absolute Monocytes 0.2 0.2 - 0.8 10*9/L    Absolute Eosinophils 0.1 0.0 - 0.4 10*9/L    Absolute Basophils 0.0 0.0 - 0.1 10*9/L    Large Unstained Cells 5 (H) 0 - 4 %    Microcytosis Slight (A) Not Present    Anisocytosis Slight (A) Not Present    Hypochromasia Marked (A) Not Present   Morphology Review    Collection Time: 11/14/18  1:50 PM   Result Value Ref Range    Smear Review Comments See Comment (A) Undefined    Polychromasia Slight (A) Not Present         ASSESSMENT:  Ms. Marilyn French is a pleasant 63 year old Caucasian female with history of cirrhosis secondary to NAFLD.  She is currently undergoing transplant evaluation.  She was last seen hepatology clinic on 07/02/2018.  At that clinic visit patient's hemoglobin was 6.4.  She was hospitalized locally for blood transfusion.  Her most recent meld score is 18. Blood group O-.  Complications of her liver disease include variceal bleed, ascites and hepatic encephalopathy.  Patient states that she underwent  an EGD locally in 12/2017.  She does have a history of GAVE as well and did require APC per patient report.  The official report is not available for review.  In regards to encephalopathy she is currently on lactulose monotherapy.  She is achieving 3???5 bowel movements a day.  Her ascites is currently being managed with torsemide 20 mg daily and spironolactone 50 mg daily.  She has not required any LVP's.  She did undergo a right heart catheterization in 01/2018 at [facility] which showed no evidence for pulmonary hypertension.  She is also seen [facility] pulmonary.  She underwent a DEXA scan on 12/25/2017.  An MRI from 12/25/2017 showed no evidence for [facility].  There is questionable nonocclusive portal vein thrombus.  Since her last clinic visit patient did undergo a repeat MRI and CT on 03/28/2018.  Her MRI was reviewed at [facility] conference and felt the PVT persists and is concerning for progression, worsening portal htn and compromising her transplant candidacy by loss of PV and SMV.  Anti-coagulation is not a good option due to her chronic PHG/GAVE blood loss.  We recommended proceeding with a TIPS.  She underwent a TIPS on 07/08/2018.  She also underwent embolization of mesocaval varices.  Her hepatic venous pressure gradient went from 10 to 2 mm Hg.  Since her TIPS she was hospitalized for hepatic encephalopathy on 07/26/2018 and now started on Xifaxan.  She does have a history of hypertension and hyperlipidemia.  She is immune to hepatitis A and B.    She was told to stop Paxil by local GI physician and had withdrawal symptoms in December with resulting anxiety and depression.  Has since been restarted on Buspar and is doing much better from a psychiatry perspective and has met with psychology this morning. No further episodes of encephalopathy causing hospitalization.      PLAN:  -  Check the results of today's TIPS Doppler ultrasound.  Will discuss at hepatobiliary conference to evaluate PVT and if further imaging needed  - Follow up discussion with IR regarding need to embolize collaterals pending doppler TIPS results (recommended in last note . Continue to discuss at hepatobiliary conference.  -  Order EGD with Dr. Ruffin Frederick for anemia and hx of GAVE requiring APC   -  Check laboratory studies.  -  Continue all medications as directed maintain low-sodium diet.  -  Continue lactulose and titrate to desired effect of 3???5 bowel movements a day (encouraged to increase today). Continue Xifaxan as directed  -  Plan to see Ms. McAllister back in liver clinic in 3 months.    Patient seen and discussed with Dr. Ruffin Frederick.    Orion Crook. Maren Beach, MD MPH  Gastroenterology Fellow

## 2018-11-18 ENCOUNTER — Other Ambulatory Visit (HOSPITAL_COMMUNITY)
Admission: RE | Admit: 2018-11-18 | Discharge: 2018-11-18 | Disposition: A | Discharge status: Home / Self Care | Payer: PPO | Source: Ambulatory Visit | Attending: Internal Medicine | Admitting: Internal Medicine

## 2018-11-18 DIAGNOSIS — D649 Anemia, unspecified: Secondary | ICD-10-CM | POA: Insufficient documentation

## 2018-11-18 DIAGNOSIS — K7460 Unspecified cirrhosis of liver: Secondary | ICD-10-CM | POA: Diagnosis not present

## 2018-11-18 DIAGNOSIS — I8501 Esophageal varices with bleeding: Secondary | ICD-10-CM | POA: Insufficient documentation

## 2018-11-18 DIAGNOSIS — K760 Fatty (change of) liver, not elsewhere classified: Secondary | ICD-10-CM | POA: Insufficient documentation

## 2018-11-18 LAB — HEMOGLOBIN AND HEMATOCRIT, BLOOD
HCT: 30.1 % — ABNORMAL LOW (ref 36.0–46.0)
Hemoglobin: 8.4 g/dL — ABNORMAL LOW (ref 12.0–15.0)

## 2018-11-19 LAB — PREPARE RBC (CROSSMATCH)

## 2018-11-20 ENCOUNTER — Encounter (HOSPITAL_COMMUNITY): Payer: Self-pay

## 2018-11-20 ENCOUNTER — Encounter (HOSPITAL_COMMUNITY)
Admission: RE | Admit: 2018-11-20 | Discharge: 2018-11-20 | Disposition: A | Discharge status: Home / Self Care | Payer: PPO | Source: Ambulatory Visit | Attending: Internal Medicine | Admitting: Internal Medicine

## 2018-11-20 DIAGNOSIS — I8501 Esophageal varices with bleeding: Secondary | ICD-10-CM | POA: Diagnosis not present

## 2018-11-20 DIAGNOSIS — K760 Fatty (change of) liver, not elsewhere classified: Secondary | ICD-10-CM

## 2018-11-20 DIAGNOSIS — K7460 Unspecified cirrhosis of liver: Secondary | ICD-10-CM | POA: Diagnosis not present

## 2018-11-20 DIAGNOSIS — D649 Anemia, unspecified: Secondary | ICD-10-CM

## 2018-11-20 DIAGNOSIS — D500 Iron deficiency anemia secondary to blood loss (chronic): Secondary | ICD-10-CM | POA: Diagnosis not present

## 2018-11-20 DIAGNOSIS — K460 Unspecified abdominal hernia with obstruction, without gangrene: Secondary | ICD-10-CM | POA: Insufficient documentation

## 2018-11-20 LAB — HEMOGLOBIN AND HEMATOCRIT, BLOOD
HCT: 30.2 % — ABNORMAL LOW (ref 36.0–46.0)
Hemoglobin: 8.6 g/dL — ABNORMAL LOW (ref 12.0–15.0)

## 2018-11-20 MED ORDER — ACETAMINOPHEN 325 MG PO TABS
650.0000 mg | ORAL_TABLET | Freq: Once | ORAL | Status: AC
  Administered 2018-11-20: 650 mg via ORAL
  Filled 2018-11-20: qty 2

## 2018-11-20 MED ORDER — DIPHENHYDRAMINE HCL 25 MG PO CAPS
25.0000 mg | ORAL_CAPSULE | Freq: Once | ORAL | Status: AC
  Administered 2018-11-20: 25 mg via ORAL
  Filled 2018-11-20: qty 1

## 2018-11-20 MED ORDER — SODIUM CHLORIDE 0.9% IV SOLUTION
Freq: Once | INTRAVENOUS | Status: AC
  Administered 2018-11-20: 09:00:00 via INTRAVENOUS

## 2018-11-20 NOTE — Unmapped (Signed)
Marilyn French 11/14/2018 [place] TIPS doppler scan reviewed at Multidisciplinary Hepatobillary Conference on 11/20/18 resulting in the following plan of care: imaged vessels adequate.  Needs MRI for next imaging.

## 2018-11-20 NOTE — Unmapped (Signed)
err

## 2018-11-21 ENCOUNTER — Encounter (INDEPENDENT_AMBULATORY_CARE_PROVIDER_SITE_OTHER): Payer: PPO | Admitting: Ophthalmology

## 2018-11-21 DIAGNOSIS — H43813 Vitreous degeneration, bilateral: Secondary | ICD-10-CM | POA: Diagnosis not present

## 2018-11-21 DIAGNOSIS — I10 Essential (primary) hypertension: Secondary | ICD-10-CM | POA: Diagnosis not present

## 2018-11-21 DIAGNOSIS — E11311 Type 2 diabetes mellitus with unspecified diabetic retinopathy with macular edema: Secondary | ICD-10-CM

## 2018-11-21 DIAGNOSIS — H2513 Age-related nuclear cataract, bilateral: Secondary | ICD-10-CM | POA: Diagnosis not present

## 2018-11-21 DIAGNOSIS — H35033 Hypertensive retinopathy, bilateral: Secondary | ICD-10-CM

## 2018-11-21 DIAGNOSIS — E113391 Type 2 diabetes mellitus with moderate nonproliferative diabetic retinopathy without macular edema, right eye: Secondary | ICD-10-CM | POA: Diagnosis not present

## 2018-11-21 DIAGNOSIS — E113312 Type 2 diabetes mellitus with moderate nonproliferative diabetic retinopathy with macular edema, left eye: Secondary | ICD-10-CM

## 2018-11-21 LAB — TYPE AND SCREEN
ABO/RH(D): O NEG
Antibody Screen: NEGATIVE
Unit division: 0

## 2018-11-21 LAB — BPAM RBC
Blood Product Expiration Date: 202003312359
ISSUE DATE / TIME: 202003040849
Unit Type and Rh: 9500

## 2018-11-22 NOTE — Unmapped (Signed)
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 be involved, including her husband using and filling her pill box weekly, checking to ensure she took them and/or prompting her to take medications, attending all appointments, and ensuring appropriate follow-up or scheduling. To her credit, pt's A1c is well controlled and she demonstrated openness today in exploring impact of her HE and strategies to begin supporting pt to accommodate for those challenges in preparation for transplant.    2. Substance Use Issues:  No concerns.    3. Transplant Issues:  Ms. Goodhart appears to have a good understanding of her disease process and the transplant surgery. She was able to articulate the risks associated with surgery as well as the post-transplant care required. Her expectations of life post-transplant are reasonable, and she has had significant experience with being a patient and requiring frequent medical care. She expressed commitment and motivation to follow medical advice after transplant as well.     4. Psychological Issues: Ms. Cosma is now followed by [facility], whom she has seen twice since her initial evaluation, on 06/18/18 and 10/29/18 (before and after her sudden stopping and withdrawal from Paxil after a GI provider told her to stop that medication). Following about a month of significant withdrawal symptoms (depression, irritability, mood lability, anxiety, suicidal ideation), she re-established psychiatric care and started psychotherapy as well. Her current medication regimen is Zoloft 50mg  and Buspar 10mg ; her latest psychiatry note indicates no contraindication for transplant. She is now seen by a local psychologist Creola Corn, PhD with about 4 sessions so far. Pt signed release to coordinate care; currently waiting for this provider to call back. At present, she continues to experience anxiety and some depressive symptoms. She appears motivated for transplant and was open and engaged throughout in discussion of psychiatric concerns and adherence. Pt was given a cognitive screener, MoCA, as well due to cognitive concerns. She scored within the normal range, so there is no need for additional neuropsychological testing at this time. Her impairments appear 2/2 HE.      5. Social: Ms. Brenes still plans for husband to be primary caregiver and daughter to be secondary. Both were present today and appeared very engaged and supportive. Pt understands the transplant process and has reasonable expectations. Defer to CSW for complete caregiving plan details. Given significant HE concerns, her caregivers will need to play a consistently active role in supporting pt and attending all visits throughout this process. We discussed additional specific strategies that her husband and daughter may implement to improve adherence now and post-transplant.      RECOMMENDATIONS:  Aina Rossbach is believed to be a marginal candidate for transplant from a psychological perspective at this time. She should demonstrate improvement in adherence and continued improvement in mental health symptoms with involvement in therapy and following with psychiatry.    We have several concerns about Ms. Grandpre's candidacy on the transplant list. To optimize her outcomes while listed and after transplant, the following is recommended:     1. Pt should continue weekly therapy with her local psychologist. She was encouraged to discuss several concerns with there therapist noted today as well, to support pt in mood, anxiety, and medical management in preparation for transplant. We will follow up with that provider to coordinate care in support of pt's goals. She should also continue regular follow-up with [facility].    2. Pt encouraged to maintain psychiatric medications (and all other medications) and to not stop or change without discussing with the associated prescribing providers first.     3. Ms. Thornley and her husband were encouraged to start using a pill box that he should fill, prompt, and check behind, given the unpredictable episodic nature of her HE.    - She should have a family member attend all medical appointments with her, and have them take notes of any changes or instructions from providers. She was encouraged to be sure that support at home is aware of any medical changes.   -  Pt should consider keeping a calendar that she and her husband can both access to keep better track of  appointments.     4. Pt should work to adhere more closely to dietary restrictions, including low sugar and low sodium. She should also try using a food log, in which she and family writes down all that she eats to better track when/what she has eaten given report of forgetfulness which can affect BS levels and insulin dosing.    5. Team, family, and patient should closely monitor mood and any changes in impairment or functioning.     6. Since pt has already made some of the initial requested changes such as establishing MH care, she is recommended to have a 6 month follow-up with this provider to re-assess symptoms and adherence. Should coping and adherence per team, patient, and therapist report improve before that time, she may be considered appropriate for listing from a psychological perspective prior to her next clinic follow-up.    Should the patient or treatment team notice a change in functioning, please refer back to transplant psychology for further evaluation and treatment. Final decision regarding listing status is based upon committee review at selection meeting.      Recommendations discussed with patient? yes  Agreed upon by patient? yes    Ms. Vanostrand was given this writer's business card with confidential voice mail number and instructed to call 911 for emergencies.

## 2018-11-26 NOTE — Unmapped (Signed)
Transplant Psychology Contact Note       Pt called this writer on Monday 11/18/18 with the name and number of her local psychologist whom she recently started seeing for therapy. She had already signed the ROI at her evaluation appointment last week.     Called her psychologist, Creola Corn PhD, at number provided by patient [phone]) and left voicemails on Tuesday 3/3 and Thursday 11/21/18 with no returned call that week. Asked for fax # to send ROI.    Dr. Alvan Dame returned my call on Monday afternoon 11/25/18, providing a better phone number to reach her [phone]) and her fax # to send ROI [phone]). Faxed the signed ROI on 11/26/18 and tried calling Dr. Alvan Dame to confirm receipt and discuss pt. No answer, LVM.       Robert Bellow, Ph.D.  Clinical Psychologist

## 2018-11-27 DIAGNOSIS — K7290 Hepatic failure, unspecified without coma: Principal | ICD-10-CM

## 2018-12-04 ENCOUNTER — Other Ambulatory Visit (INDEPENDENT_AMBULATORY_CARE_PROVIDER_SITE_OTHER): Payer: Self-pay | Admitting: Internal Medicine

## 2018-12-04 ENCOUNTER — Other Ambulatory Visit: Payer: Self-pay

## 2018-12-04 ENCOUNTER — Other Ambulatory Visit (HOSPITAL_COMMUNITY)
Admission: RE | Admit: 2018-12-04 | Discharge: 2018-12-04 | Disposition: A | Discharge status: Home / Self Care | Payer: PPO | Source: Ambulatory Visit | Attending: Internal Medicine | Admitting: Internal Medicine

## 2018-12-04 DIAGNOSIS — K7460 Unspecified cirrhosis of liver: Secondary | ICD-10-CM | POA: Diagnosis not present

## 2018-12-04 DIAGNOSIS — F4323 Adjustment disorder with mixed anxiety and depressed mood: Secondary | ICD-10-CM | POA: Diagnosis not present

## 2018-12-04 DIAGNOSIS — D500 Iron deficiency anemia secondary to blood loss (chronic): Secondary | ICD-10-CM

## 2018-12-04 LAB — HEMOGLOBIN AND HEMATOCRIT, BLOOD
HCT: 25.5 % — ABNORMAL LOW (ref 36.0–46.0)
Hemoglobin: 7.1 g/dL — ABNORMAL LOW (ref 12.0–15.0)

## 2018-12-04 MED ORDER — SERTRALINE 50 MG TABLET
ORAL_TABLET | Freq: Every day | ORAL | 1 refills | 0.00000 days | Status: CP
Start: 2018-12-04 — End: 2019-02-07

## 2018-12-04 MED ORDER — BUSPIRONE 10 MG TABLET
ORAL_TABLET | Freq: Two times a day (BID) | ORAL | 3 refills | 0.00000 days | Status: SS
Start: 2018-12-04 — End: 2019-04-04

## 2018-12-04 NOTE — Unmapped (Deleted)
Called patient to discussing cancelling appointment in the setting of global pandemic.  Per my discussion over the phone, they are psychiatrically stable at this time and appropriate for later appointment date.  They note they have been improving in terms of mood and sleep.  They have agreed to call and reschedule.  Medications will be refilled as needed.  Patient was provided with this authors contact information for any ongoing concerns.

## 2018-12-04 NOTE — Unmapped (Signed)
Called patient to discussing cancelling appointment in the setting of global pandemic.  Per my discussion over the phone, they are psychiatrically stable at this time and appropriate for later appointment date.  They note they have been improving in terms of mood and sleep.  They have agreed to call and reschedule.  Medications will be refilled as needed.  Patient was provided with this authors contact information for any ongoing concerns.

## 2018-12-06 ENCOUNTER — Telehealth (INDEPENDENT_AMBULATORY_CARE_PROVIDER_SITE_OTHER): Payer: Self-pay | Admitting: *Deleted

## 2018-12-06 NOTE — Telephone Encounter (Signed)
Maria [name] called the office this morning. She states that she weighs 198.5 lbs this morning. In the last month she went up to 192 lbs. She says that she is taking her fluid medications as directed. Feeling weak.   Talked with Dr.Rehman, he recommends that the patient double up on her fluid medication., until her weight id 185 lbs.  Patient called and given Dr.Rehman's instruction. She says this is what the dosage will be if she doubles up.  Spironolactone 100 mg daily , Torsemide 40 mg daily.  Patient states that these medications are changed so much that she is confused on what she is doing.  Forwarded to Dr.Rehman to review.

## 2018-12-09 ENCOUNTER — Encounter (HOSPITAL_COMMUNITY)
Admission: RE | Admit: 2018-12-09 | Discharge: 2018-12-09 | Disposition: A | Discharge status: Home / Self Care | Payer: PPO | Source: Ambulatory Visit | Attending: Internal Medicine | Admitting: Internal Medicine

## 2018-12-09 ENCOUNTER — Other Ambulatory Visit: Payer: Self-pay

## 2018-12-09 DIAGNOSIS — D500 Iron deficiency anemia secondary to blood loss (chronic): Secondary | ICD-10-CM | POA: Diagnosis not present

## 2018-12-09 LAB — HEMOGLOBIN AND HEMATOCRIT, BLOOD
HCT: 26.4 % — ABNORMAL LOW (ref 36.0–46.0)
Hemoglobin: 7.3 g/dL — ABNORMAL LOW (ref 12.0–15.0)

## 2018-12-09 NOTE — Unmapped (Signed)
Call placed to [facility] regarding her appointments on 6/5.  Marilyn French verbally agreed to appointment time and dates.

## 2018-12-10 ENCOUNTER — Encounter (HOSPITAL_COMMUNITY): Admission: RE | Admit: 2018-12-10 | Payer: PPO | Source: Ambulatory Visit

## 2018-12-16 ENCOUNTER — Other Ambulatory Visit: Payer: Self-pay

## 2018-12-16 ENCOUNTER — Encounter (HOSPITAL_COMMUNITY)
Admission: RE | Admit: 2018-12-16 | Discharge: 2018-12-16 | Disposition: A | Discharge status: Home / Self Care | Payer: PPO | Source: Ambulatory Visit | Attending: Internal Medicine | Admitting: Internal Medicine

## 2018-12-16 DIAGNOSIS — D500 Iron deficiency anemia secondary to blood loss (chronic): Secondary | ICD-10-CM | POA: Diagnosis not present

## 2018-12-16 LAB — HEMOGLOBIN AND HEMATOCRIT, BLOOD
HCT: 24.4 % — ABNORMAL LOW (ref 36.0–46.0)
Hemoglobin: 6.7 g/dL — CL (ref 12.0–15.0)

## 2018-12-16 LAB — PREPARE RBC (CROSSMATCH)

## 2018-12-17 ENCOUNTER — Encounter (HOSPITAL_COMMUNITY)
Admission: RE | Admit: 2018-12-17 | Discharge: 2018-12-17 | Disposition: A | Discharge status: Home / Self Care | Payer: PPO | Source: Ambulatory Visit | Attending: Internal Medicine | Admitting: Internal Medicine

## 2018-12-17 DIAGNOSIS — D500 Iron deficiency anemia secondary to blood loss (chronic): Secondary | ICD-10-CM | POA: Diagnosis not present

## 2018-12-17 LAB — HEMOGLOBIN AND HEMATOCRIT, BLOOD
HCT: 26.9 % — ABNORMAL LOW (ref 36.0–46.0)
Hemoglobin: 7.6 g/dL — ABNORMAL LOW (ref 12.0–15.0)

## 2018-12-17 MED ORDER — ACETAMINOPHEN 325 MG PO TABS
ORAL_TABLET | ORAL | Status: AC
  Filled 2018-12-17: qty 2

## 2018-12-17 MED ORDER — DIPHENHYDRAMINE HCL 25 MG PO CAPS
ORAL_CAPSULE | ORAL | Status: AC
  Filled 2018-12-17: qty 1

## 2018-12-17 MED ORDER — DIPHENHYDRAMINE HCL 25 MG PO CAPS
25.0000 mg | ORAL_CAPSULE | Freq: Once | ORAL | Status: AC
  Administered 2018-12-17: 25 mg via ORAL

## 2018-12-17 MED ORDER — FUROSEMIDE 10 MG/ML IJ SOLN
INTRAMUSCULAR | Status: AC
  Filled 2018-12-17: qty 4

## 2018-12-17 MED ORDER — SODIUM CHLORIDE 0.9% IV SOLUTION
Freq: Once | INTRAVENOUS | Status: AC
  Administered 2018-12-17: 10:00:00 via INTRAVENOUS

## 2018-12-17 MED ORDER — FUROSEMIDE 10 MG/ML IJ SOLN: 20.0000 mg | Freq: Once | INTRAMUSCULAR | Status: AC

## 2018-12-17 MED ORDER — FUROSEMIDE 10 MG/ML IJ SOLN
20.0000 mg | Freq: Once | INTRAMUSCULAR | Status: AC
  Administered 2018-12-17: 20 mg via INTRAVENOUS

## 2018-12-17 MED ORDER — ACETAMINOPHEN 325 MG PO TABS
650.0000 mg | ORAL_TABLET | Freq: Once | ORAL | Status: AC
  Administered 2018-12-17: 650 mg via ORAL

## 2018-12-18 LAB — BPAM RBC
Blood Product Expiration Date: 202004202359
ISSUE DATE / TIME: 202003311000
Unit Type and Rh: 9500

## 2018-12-18 LAB — TYPE AND SCREEN
ABO/RH(D): O NEG
Antibody Screen: NEGATIVE
Unit division: 0

## 2018-12-19 ENCOUNTER — Encounter (INDEPENDENT_AMBULATORY_CARE_PROVIDER_SITE_OTHER): Payer: PPO | Admitting: Ophthalmology

## 2018-12-19 ENCOUNTER — Other Ambulatory Visit: Payer: Self-pay

## 2018-12-19 DIAGNOSIS — Z6832 Body mass index (BMI) 32.0-32.9, adult: Secondary | ICD-10-CM | POA: Diagnosis not present

## 2018-12-19 DIAGNOSIS — E113312 Type 2 diabetes mellitus with moderate nonproliferative diabetic retinopathy with macular edema, left eye: Secondary | ICD-10-CM

## 2018-12-19 DIAGNOSIS — M450 Ankylosing spondylitis of multiple sites in spine: Secondary | ICD-10-CM | POA: Diagnosis not present

## 2018-12-19 DIAGNOSIS — E870 Hyperosmolality and hypernatremia: Secondary | ICD-10-CM | POA: Diagnosis not present

## 2018-12-19 DIAGNOSIS — H35033 Hypertensive retinopathy, bilateral: Secondary | ICD-10-CM | POA: Diagnosis not present

## 2018-12-19 DIAGNOSIS — G8911 Acute pain due to trauma: Secondary | ICD-10-CM | POA: Diagnosis not present

## 2018-12-19 DIAGNOSIS — Z299 Encounter for prophylactic measures, unspecified: Secondary | ICD-10-CM | POA: Diagnosis not present

## 2018-12-19 DIAGNOSIS — E113391 Type 2 diabetes mellitus with moderate nonproliferative diabetic retinopathy without macular edema, right eye: Secondary | ICD-10-CM

## 2018-12-19 DIAGNOSIS — Z1331 Encounter for screening for depression: Secondary | ICD-10-CM | POA: Diagnosis not present

## 2018-12-19 DIAGNOSIS — Z0000 Encounter for general adult medical examination without abnormal findings: Secondary | ICD-10-CM | POA: Diagnosis not present

## 2018-12-19 DIAGNOSIS — I10 Essential (primary) hypertension: Secondary | ICD-10-CM | POA: Diagnosis not present

## 2018-12-19 DIAGNOSIS — Z1211 Encounter for screening for malignant neoplasm of colon: Secondary | ICD-10-CM | POA: Diagnosis not present

## 2018-12-19 DIAGNOSIS — E11311 Type 2 diabetes mellitus with unspecified diabetic retinopathy with macular edema: Secondary | ICD-10-CM

## 2018-12-19 DIAGNOSIS — Z7189 Other specified counseling: Secondary | ICD-10-CM | POA: Diagnosis not present

## 2018-12-19 DIAGNOSIS — T794XXA Traumatic shock, initial encounter: Secondary | ICD-10-CM | POA: Diagnosis not present

## 2018-12-19 DIAGNOSIS — R5383 Other fatigue: Secondary | ICD-10-CM | POA: Diagnosis not present

## 2018-12-19 DIAGNOSIS — R809 Proteinuria, unspecified: Secondary | ICD-10-CM | POA: Diagnosis not present

## 2018-12-19 DIAGNOSIS — D508 Other iron deficiency anemias: Secondary | ICD-10-CM | POA: Diagnosis not present

## 2018-12-19 DIAGNOSIS — J9600 Acute respiratory failure, unspecified whether with hypoxia or hypercapnia: Secondary | ICD-10-CM | POA: Diagnosis not present

## 2018-12-19 DIAGNOSIS — H2513 Age-related nuclear cataract, bilateral: Secondary | ICD-10-CM

## 2018-12-19 DIAGNOSIS — M0609 Rheumatoid arthritis without rheumatoid factor, multiple sites: Secondary | ICD-10-CM | POA: Diagnosis not present

## 2018-12-19 DIAGNOSIS — L02414 Cutaneous abscess of left upper limb: Secondary | ICD-10-CM | POA: Diagnosis not present

## 2018-12-19 DIAGNOSIS — H43813 Vitreous degeneration, bilateral: Secondary | ICD-10-CM | POA: Diagnosis not present

## 2018-12-19 DIAGNOSIS — Z1339 Encounter for screening examination for other mental health and behavioral disorders: Secondary | ICD-10-CM | POA: Diagnosis not present

## 2018-12-20 DIAGNOSIS — E039 Hypothyroidism, unspecified: Secondary | ICD-10-CM | POA: Diagnosis not present

## 2018-12-20 DIAGNOSIS — M79661 Pain in right lower leg: Secondary | ICD-10-CM | POA: Diagnosis not present

## 2018-12-20 DIAGNOSIS — I10 Essential (primary) hypertension: Secondary | ICD-10-CM | POA: Diagnosis not present

## 2018-12-20 DIAGNOSIS — K7290 Hepatic failure, unspecified without coma: Secondary | ICD-10-CM | POA: Diagnosis not present

## 2018-12-20 DIAGNOSIS — M25871 Other specified joint disorders, right ankle and foot: Secondary | ICD-10-CM | POA: Diagnosis not present

## 2018-12-20 DIAGNOSIS — E7800 Pure hypercholesterolemia, unspecified: Secondary | ICD-10-CM | POA: Diagnosis not present

## 2018-12-20 DIAGNOSIS — E1165 Type 2 diabetes mellitus with hyperglycemia: Secondary | ICD-10-CM | POA: Diagnosis not present

## 2018-12-20 DIAGNOSIS — S8410XA Injury of peroneal nerve at lower leg level, unspecified leg, initial encounter: Secondary | ICD-10-CM | POA: Diagnosis not present

## 2018-12-20 DIAGNOSIS — M25572 Pain in left ankle and joints of left foot: Secondary | ICD-10-CM | POA: Diagnosis not present

## 2018-12-23 ENCOUNTER — Encounter (HOSPITAL_COMMUNITY)
Admission: RE | Admit: 2018-12-23 | Discharge: 2018-12-23 | Disposition: A | Discharge status: Home / Self Care | Payer: PPO | Source: Ambulatory Visit | Attending: Internal Medicine | Admitting: Internal Medicine

## 2018-12-23 ENCOUNTER — Other Ambulatory Visit: Payer: Self-pay

## 2018-12-23 DIAGNOSIS — I8501 Esophageal varices with bleeding: Secondary | ICD-10-CM | POA: Diagnosis not present

## 2018-12-23 DIAGNOSIS — K460 Unspecified abdominal hernia with obstruction, without gangrene: Secondary | ICD-10-CM | POA: Diagnosis not present

## 2018-12-23 DIAGNOSIS — K7460 Unspecified cirrhosis of liver: Secondary | ICD-10-CM | POA: Diagnosis not present

## 2018-12-23 DIAGNOSIS — D500 Iron deficiency anemia secondary to blood loss (chronic): Secondary | ICD-10-CM | POA: Insufficient documentation

## 2018-12-23 LAB — PREPARE RBC (CROSSMATCH)

## 2018-12-23 LAB — SAMPLE TO BLOOD BANK

## 2018-12-23 LAB — HEMOGLOBIN AND HEMATOCRIT, BLOOD
HCT: 25.3 % — ABNORMAL LOW (ref 36.0–46.0)
Hemoglobin: 6.9 g/dL — CL (ref 12.0–15.0)

## 2018-12-24 ENCOUNTER — Encounter (HOSPITAL_COMMUNITY)
Admission: RE | Admit: 2018-12-24 | Discharge: 2018-12-24 | Disposition: A | Discharge status: Home / Self Care | Payer: PPO | Source: Ambulatory Visit | Attending: Internal Medicine | Admitting: Internal Medicine

## 2018-12-24 ENCOUNTER — Other Ambulatory Visit: Payer: Self-pay

## 2018-12-24 ENCOUNTER — Encounter (HOSPITAL_COMMUNITY): Payer: Self-pay

## 2018-12-24 DIAGNOSIS — D500 Iron deficiency anemia secondary to blood loss (chronic): Secondary | ICD-10-CM | POA: Diagnosis not present

## 2018-12-24 MED ORDER — DIPHENHYDRAMINE HCL 25 MG PO CAPS
25.0000 mg | ORAL_CAPSULE | Freq: Once | ORAL | Status: AC
  Administered 2018-12-24: 25 mg via ORAL
  Filled 2018-12-24: qty 1

## 2018-12-24 MED ORDER — SODIUM CHLORIDE 0.9% IV SOLUTION
Freq: Once | INTRAVENOUS | Status: AC
  Administered 2018-12-24: 09:00:00 via INTRAVENOUS

## 2018-12-24 MED ORDER — FUROSEMIDE 20 MG PO TABS
20.0000 mg | ORAL_TABLET | Freq: Once | ORAL | Status: AC
  Administered 2018-12-24: 20 mg via ORAL
  Filled 2018-12-24 (×2): qty 1

## 2018-12-24 MED ORDER — ACETAMINOPHEN 325 MG PO TABS
650.0000 mg | ORAL_TABLET | Freq: Once | ORAL | Status: AC
  Administered 2018-12-24: 650 mg via ORAL
  Filled 2018-12-24: qty 2

## 2018-12-24 MED ORDER — SODIUM CHLORIDE 0.9% IV SOLUTION: Freq: Once | INTRAVENOUS | Status: AC

## 2018-12-25 LAB — TYPE AND SCREEN
ABO/RH(D): O NEG
Antibody Screen: NEGATIVE
Unit division: 0

## 2018-12-25 LAB — BPAM RBC
Blood Product Expiration Date: 202004282359
ISSUE DATE / TIME: 202004070839
Unit Type and Rh: 9500

## 2018-12-29 NOTE — Progress Notes (Signed)
Patient was transfused 1 unit of PRBCs this week

## 2019-01-06 ENCOUNTER — Other Ambulatory Visit: Payer: Self-pay

## 2019-01-06 ENCOUNTER — Encounter (HOSPITAL_COMMUNITY)
Admission: RE | Admit: 2019-01-06 | Discharge: 2019-01-06 | Disposition: A | Discharge status: Home / Self Care | Payer: PPO | Source: Ambulatory Visit | Attending: Internal Medicine | Admitting: Internal Medicine

## 2019-01-06 DIAGNOSIS — D500 Iron deficiency anemia secondary to blood loss (chronic): Secondary | ICD-10-CM | POA: Diagnosis not present

## 2019-01-06 LAB — HEMOGLOBIN AND HEMATOCRIT, BLOOD
HCT: 29.3 % — ABNORMAL LOW (ref 36.0–46.0)
Hemoglobin: 8.2 g/dL — ABNORMAL LOW (ref 12.0–15.0)

## 2019-01-07 ENCOUNTER — Other Ambulatory Visit (INDEPENDENT_AMBULATORY_CARE_PROVIDER_SITE_OTHER): Payer: Self-pay | Admitting: *Deleted

## 2019-01-07 DIAGNOSIS — K760 Fatty (change of) liver, not elsewhere classified: Secondary | ICD-10-CM

## 2019-01-07 DIAGNOSIS — R069 Unspecified abnormalities of breathing: Secondary | ICD-10-CM | POA: Diagnosis not present

## 2019-01-07 DIAGNOSIS — D500 Iron deficiency anemia secondary to blood loss (chronic): Secondary | ICD-10-CM

## 2019-01-07 DIAGNOSIS — I8501 Esophageal varices with bleeding: Secondary | ICD-10-CM

## 2019-01-07 DIAGNOSIS — D649 Anemia, unspecified: Secondary | ICD-10-CM

## 2019-01-07 DIAGNOSIS — K7460 Unspecified cirrhosis of liver: Secondary | ICD-10-CM

## 2019-01-07 DIAGNOSIS — Z8719 Personal history of other diseases of the digestive system: Secondary | ICD-10-CM

## 2019-01-07 NOTE — Progress Notes (Signed)
Noted and per Dr.Rehman we will repeat H&H and Type and Screen next week.

## 2019-01-14 ENCOUNTER — Other Ambulatory Visit: Payer: Self-pay

## 2019-01-14 ENCOUNTER — Encounter (HOSPITAL_COMMUNITY)
Admission: RE | Admit: 2019-01-14 | Discharge: 2019-01-14 | Disposition: A | Discharge status: Home / Self Care | Payer: PPO | Source: Ambulatory Visit | Attending: Internal Medicine | Admitting: Internal Medicine

## 2019-01-14 DIAGNOSIS — D500 Iron deficiency anemia secondary to blood loss (chronic): Secondary | ICD-10-CM | POA: Diagnosis not present

## 2019-01-14 LAB — SAMPLE TO BLOOD BANK

## 2019-01-14 LAB — HEMOGLOBIN AND HEMATOCRIT, BLOOD
HCT: 26.1 % — ABNORMAL LOW (ref 36.0–46.0)
Hemoglobin: 7.3 g/dL — ABNORMAL LOW (ref 12.0–15.0)

## 2019-01-14 NOTE — Progress Notes (Signed)
Results for CHAMIA, SCHMUTZ (MRN [id number]) as of 01/14/2019 11:36  Ref. Range 01/14/2019 09:56  Hemoglobin Latest Ref Range: 12.0 - 15.0 g/dL 7.3 (L)  HCT Latest Ref Range: 36.0 - 46.0 % 26.1 (L)   Hold clot available until 01/17/19. Blood transfusion not indicated due to national blood shortage parameter. Pt instructed to seek medical help if she feels SOB, chest pain, severe weakness.

## 2019-01-16 ENCOUNTER — Encounter (INDEPENDENT_AMBULATORY_CARE_PROVIDER_SITE_OTHER): Payer: PPO | Admitting: Ophthalmology

## 2019-01-16 ENCOUNTER — Other Ambulatory Visit (INDEPENDENT_AMBULATORY_CARE_PROVIDER_SITE_OTHER): Payer: Self-pay | Admitting: *Deleted

## 2019-01-16 ENCOUNTER — Other Ambulatory Visit: Payer: Self-pay

## 2019-01-16 DIAGNOSIS — E113391 Type 2 diabetes mellitus with moderate nonproliferative diabetic retinopathy without macular edema, right eye: Secondary | ICD-10-CM | POA: Diagnosis not present

## 2019-01-16 DIAGNOSIS — H35033 Hypertensive retinopathy, bilateral: Secondary | ICD-10-CM

## 2019-01-16 DIAGNOSIS — E11311 Type 2 diabetes mellitus with unspecified diabetic retinopathy with macular edema: Secondary | ICD-10-CM | POA: Diagnosis not present

## 2019-01-16 DIAGNOSIS — K7460 Unspecified cirrhosis of liver: Secondary | ICD-10-CM

## 2019-01-16 DIAGNOSIS — H43813 Vitreous degeneration, bilateral: Secondary | ICD-10-CM | POA: Diagnosis not present

## 2019-01-16 DIAGNOSIS — K922 Gastrointestinal hemorrhage, unspecified: Secondary | ICD-10-CM

## 2019-01-16 DIAGNOSIS — H2513 Age-related nuclear cataract, bilateral: Secondary | ICD-10-CM | POA: Diagnosis not present

## 2019-01-16 DIAGNOSIS — E113312 Type 2 diabetes mellitus with moderate nonproliferative diabetic retinopathy with macular edema, left eye: Secondary | ICD-10-CM | POA: Diagnosis not present

## 2019-01-16 DIAGNOSIS — D509 Iron deficiency anemia, unspecified: Secondary | ICD-10-CM

## 2019-01-16 DIAGNOSIS — I10 Essential (primary) hypertension: Secondary | ICD-10-CM

## 2019-01-17 ENCOUNTER — Encounter (HOSPITAL_COMMUNITY)
Admission: RE | Admit: 2019-01-17 | Discharge: 2019-01-17 | Disposition: A | Discharge status: Home / Self Care | Payer: PPO | Source: Ambulatory Visit | Attending: Internal Medicine | Admitting: Internal Medicine

## 2019-01-17 DIAGNOSIS — K7469 Other cirrhosis of liver: Secondary | ICD-10-CM | POA: Insufficient documentation

## 2019-01-20 ENCOUNTER — Encounter (HOSPITAL_COMMUNITY): Admission: RE | Admit: 2019-01-20 | Payer: PPO | Source: Ambulatory Visit

## 2019-01-20 ENCOUNTER — Other Ambulatory Visit (HOSPITAL_COMMUNITY)
Admission: RE | Admit: 2019-01-20 | Discharge: 2019-01-20 | Disposition: A | Discharge status: Home / Self Care | Payer: PPO | Source: Ambulatory Visit | Attending: Internal Medicine | Admitting: Internal Medicine

## 2019-01-20 ENCOUNTER — Telehealth (HOSPITAL_COMMUNITY): Payer: Self-pay

## 2019-01-20 DIAGNOSIS — K7460 Unspecified cirrhosis of liver: Secondary | ICD-10-CM | POA: Diagnosis not present

## 2019-01-20 DIAGNOSIS — D509 Iron deficiency anemia, unspecified: Secondary | ICD-10-CM | POA: Diagnosis not present

## 2019-01-20 DIAGNOSIS — K922 Gastrointestinal hemorrhage, unspecified: Secondary | ICD-10-CM | POA: Diagnosis not present

## 2019-01-20 LAB — SAMPLE TO BLOOD BANK

## 2019-01-20 LAB — HEMOGLOBIN AND HEMATOCRIT, BLOOD
HCT: 21.6 % — ABNORMAL LOW (ref 36.0–46.0)
Hemoglobin: 5.9 g/dL — CL (ref 12.0–15.0)

## 2019-01-20 LAB — PREPARE RBC (CROSSMATCH)

## 2019-01-20 NOTE — Telephone Encounter (Signed)
Telephone call received from [facility], lab, regarding hgb 5.9 today.  Patient is Dr. Otelia Limes patient.  Called the lab to notify they need to call Dr. Laural Golden for results.  Verbalized understanding.

## 2019-01-21 ENCOUNTER — Encounter (HOSPITAL_COMMUNITY)
Admission: RE | Admit: 2019-01-21 | Discharge: 2019-01-21 | Disposition: A | Discharge status: Home / Self Care | Payer: PPO | Source: Ambulatory Visit | Attending: Internal Medicine | Admitting: Internal Medicine

## 2019-01-21 ENCOUNTER — Other Ambulatory Visit: Payer: Self-pay

## 2019-01-21 ENCOUNTER — Encounter (HOSPITAL_COMMUNITY): Payer: Self-pay

## 2019-01-21 DIAGNOSIS — K7460 Unspecified cirrhosis of liver: Secondary | ICD-10-CM | POA: Insufficient documentation

## 2019-01-21 DIAGNOSIS — I8501 Esophageal varices with bleeding: Secondary | ICD-10-CM | POA: Insufficient documentation

## 2019-01-21 DIAGNOSIS — D500 Iron deficiency anemia secondary to blood loss (chronic): Secondary | ICD-10-CM | POA: Insufficient documentation

## 2019-01-21 DIAGNOSIS — K460 Unspecified abdominal hernia with obstruction, without gangrene: Secondary | ICD-10-CM | POA: Diagnosis not present

## 2019-01-21 MED ORDER — SODIUM CHLORIDE 0.9% IV SOLUTION
Freq: Once | INTRAVENOUS | Status: AC
  Administered 2019-01-21: 09:00:00 via INTRAVENOUS

## 2019-01-21 MED ORDER — DIPHENHYDRAMINE HCL 25 MG PO CAPS
25.0000 mg | ORAL_CAPSULE | Freq: Once | ORAL | Status: AC
  Administered 2019-01-21: 25 mg via ORAL
  Filled 2019-01-21: qty 1

## 2019-01-21 MED ORDER — ACETAMINOPHEN 325 MG PO TABS
650.0000 mg | ORAL_TABLET | Freq: Once | ORAL | Status: AC
  Administered 2019-01-21: 650 mg via ORAL
  Filled 2019-01-21: qty 2

## 2019-01-21 MED ORDER — FUROSEMIDE 10 MG/ML IJ SOLN
40.0000 mg | Freq: Once | INTRAMUSCULAR | Status: AC
  Administered 2019-01-21: 11:00:00 40 mg via INTRAVENOUS
  Filled 2019-01-21: qty 4

## 2019-01-21 NOTE — Progress Notes (Signed)
Rate on blood decreased to 200 due to mild aching at IV site.  Site looks good.  No signs of infiltration noted.  Warm compress applied.  reassessed at 1010 and patient states site feeling better.

## 2019-01-21 NOTE — Progress Notes (Signed)
Patient notified that she has standing order for blood work weekly, per Dr. Laural Golden and to report to our lab Monday.  Verbalized understanding.

## 2019-01-22 LAB — TYPE AND SCREEN
ABO/RH(D): O NEG
Antibody Screen: NEGATIVE
Unit division: 0
Unit division: 0

## 2019-01-22 LAB — BPAM RBC
Blood Product Expiration Date: 202005112359
Blood Product Expiration Date: 202005192359
ISSUE DATE / TIME: 202005050857
ISSUE DATE / TIME: 202005051051
Unit Type and Rh: 9500
Unit Type and Rh: 9500

## 2019-01-27 ENCOUNTER — Ambulatory Visit (INDEPENDENT_AMBULATORY_CARE_PROVIDER_SITE_OTHER): Payer: PPO | Admitting: Internal Medicine

## 2019-01-27 ENCOUNTER — Other Ambulatory Visit: Payer: Self-pay

## 2019-01-27 ENCOUNTER — Encounter (INDEPENDENT_AMBULATORY_CARE_PROVIDER_SITE_OTHER): Payer: Self-pay | Admitting: Internal Medicine

## 2019-01-27 ENCOUNTER — Other Ambulatory Visit (INDEPENDENT_AMBULATORY_CARE_PROVIDER_SITE_OTHER): Payer: Self-pay | Admitting: Internal Medicine

## 2019-01-27 ENCOUNTER — Ambulatory Visit (HOSPITAL_COMMUNITY)
Admission: RE | Admit: 2019-01-27 | Discharge: 2019-01-27 | Disposition: A | Discharge status: Home / Self Care | Payer: PPO | Source: Ambulatory Visit | Attending: Internal Medicine | Admitting: Internal Medicine

## 2019-01-27 VITALS — BP 124/67 | HR 80 | Temp 98.2°F | Resp 18 | Ht 64.0 in | Wt 205.9 lb

## 2019-01-27 DIAGNOSIS — K7460 Unspecified cirrhosis of liver: Secondary | ICD-10-CM | POA: Diagnosis not present

## 2019-01-27 DIAGNOSIS — R600 Localized edema: Secondary | ICD-10-CM | POA: Insufficient documentation

## 2019-01-27 DIAGNOSIS — K7469 Other cirrhosis of liver: Secondary | ICD-10-CM | POA: Diagnosis not present

## 2019-01-27 DIAGNOSIS — D500 Iron deficiency anemia secondary to blood loss (chronic): Secondary | ICD-10-CM | POA: Diagnosis not present

## 2019-01-27 DIAGNOSIS — K7290 Hepatic failure, unspecified without coma: Secondary | ICD-10-CM

## 2019-01-27 DIAGNOSIS — K7682 Hepatic encephalopathy: Secondary | ICD-10-CM

## 2019-01-27 LAB — SAMPLE TO BLOOD BANK

## 2019-01-27 LAB — HEMOGLOBIN AND HEMATOCRIT, BLOOD
HCT: 26 % — ABNORMAL LOW (ref 36.0–46.0)
Hemoglobin: 7.6 g/dL — ABNORMAL LOW (ref 12.0–15.0)

## 2019-01-27 NOTE — Progress Notes (Signed)
Presenting complaint;  Edema to left upper extremity and left breast.  Database and subjective:  Patient is 63 year old Caucasian female who has cirrhosis secondary to NASH complicated by hepatic encephalopathy esophageal varices which have been eradicated with banding as well as ascites who underwent TIPS in October 2019 along with embolization of mesocaval varices. Patient is followed at [facility] liver clinic but still has not been listed for transplant. Patient has a history of chronic GI blood loss from gastric antral vascular ectasia for which she has undergone multiple APC sessions without much benefit.  Last EGD was in 11/09/2017. She has been seen at [facility] and and was to have EGD with RFA therapy of GAVE but procedures not been scheduled because of ongoing COVID-19 pandemic. She has been receiving a unit of PRBCs almost every week. Her hemoglobin this morning was 7.6. Patient called last week stating her weight had increased to 212 pounds.  She was advised to increase torsemide to 40 mg a day. Call this morning and requested to be seen.  She has noted progressive edema to left upper extremity and she has also noted enlargement of her left breast.  She says this edema has gone down significantly since diuretic dose was increased.  There is no history of blunt injury to her upper extremity.  She did notice sharp pain over left forearm but it has improved.  She feels she has more edema to left lower extremity as well.  She is not sure if she sleeps more on the left side.  She states she is breathing better when she is supine since she is been on higher dose of diuretic. Since diuretic was changed she has lost 7 pounds. More than a month ago her weight ranged between 185 and 190 pounds. She denies nausea vomiting or abdominal pain.  Her stools are dark but not tarry or red in color.     Current Medications: Outpatient Encounter Medications as of 01/27/2019  Medication Sig  .  busPIRone (BUSPAR) 5 MG tablet Take 10 mg by mouth 2 (two) times daily.   . cholecalciferol (VITAMIN D) 1000 UNITS tablet Take 1,000 Units by mouth 3 (three) times a week.   Marland Kitchen HUMALOG 100 UNIT/ML injection Inject 12-20 Units into the skin 3 (three) times daily with meals.   . Insulin Glargine (LANTUS SOLOSTAR) 100 UNIT/ML Solostar Pen Inject 40 Units into the skin daily at 10 pm. (Patient taking differently: Inject 45 Units into the skin every morning. )  . Insulin Pen Needle 31G X 5 MM MISC Use with each insulin injection  . lactulose (CHRONULAC) 10 GM/15ML solution TAKE 30 MLS BY MOUTH FOUR TIMES DAILY (Patient taking differently: Take 20 g by mouth 4 (four) times daily. )  . levothyroxine (SYNTHROID, LEVOTHROID) 25 MCG tablet Take 25 mcg by mouth daily before breakfast.   . magnesium oxide (MAG-OX) 400 MG tablet Take 400 mg by mouth daily.   . multivitamin (VIT W/EXTRA C) CHEW chewable tablet Chew 1 tablet by mouth daily.  . OXYGEN Inhale 2 L into the lungs at bedtime.   . pantoprazole (PROTONIX) 40 MG tablet Take 1 tablet (40 mg total) by mouth 2 (two) times daily.  . rifaximin (XIFAXAN) 550 MG TABS tablet Take 1 tablet (550 mg total) by mouth 3 (three) times daily. (Patient taking differently: Take 550 mg by mouth 2 (two) times daily. )  . sertraline (ZOLOFT) 50 MG tablet Take 50 mg by mouth daily.  Marland Kitchen spironolactone (ALDACTONE)  50 MG tablet TAKE 1 TABLET BY MOUTH TWICE DAILY  . torsemide (DEMADEX) 20 MG tablet Take 1 tablet (20 mg total) by mouth daily. Hold until further instructions by Dr. Laural Golden (Patient taking differently: Take 20 mg by mouth daily. )  . trimethoprim-polymyxin b (POLYTRIM) ophthalmic solution Place 1 drop into the left eye See admin instructions. Use 3 to 4 times daily for 2 days following monthly eye injection  . zinc sulfate 220 (50 Zn) MG capsule Take 1 capsule (220 mg total) by mouth daily.  . potassium chloride (K-DUR) 10 MEQ tablet Take 1 tablet (10 mEq total) by  mouth 2 (two) times daily.  . rosuvastatin (CRESTOR) 5 MG tablet Take 1 tablet (5 mg total) by mouth every other day. (Patient not taking: Reported on 01/27/2019)   No facility-administered encounter medications on file as of 01/27/2019.      Objective: Blood pressure 124/67, pulse 80, temperature 98.2 F (36.8 C), temperature source Oral, resp. rate 18, height _0  (1.626 m), weight 205 lb 14.4 oz (93.4 kg). Patient is alert and she appears pale. She does not have asterixis. Conjunctiva is pale. Sclera is nonicteric Oropharyngeal mucosa is normal. No neck masses or thyromegaly noted. Left breast is larger than the right.  It is somewhat firm but not tender around the nipple.  No axillary adenopathy noted on either side. Cardiac exam with regular rhythm normal S1 and S2.  She has faint murmur at left upper sternal border. Lungs are clear to auscultation. Abdomen is full.  On palpation is soft and nontender.  Liver spleen cannot be palpated. Left forearm and left arm are larger than the right upper extremity.  She has nonpitting edema.  Skin color appears to be normal.  There is no prominence to veins in this extremity. She has 2+ pitting edema involving both legs below the level of knees.  It is symmetrical.  Labs/studies Results:  CBC Latest Ref Rng & Units 01/27/2019 01/20/2019 01/14/2019  WBC 4.0 - 10.5 K/uL - - -  Hemoglobin 12.0 - 15.0 g/dL 7.6(L) 5.9(LL) 7.3(L)  Hematocrit 36.0 - 46.0 % 26.0(L) 21.6(L) 26.1(L)  Platelets 150 - 400 K/uL - - -    CMP Latest Ref Rng & Units 10/14/2018 09/16/2018 08/25/2018  Glucose 65 - 99 mg/dL 195(H) 362(H) 303(H)  BUN 8 - 27 mg/dL _1 Creatinine 0.57 - 1.00 mg/dL 0.76 0.80 0.78  Sodium 134 - 144 mmol/L 142 136 137  Potassium 3.5 - 5.2 mmol/L 4.2 3.5 3.4(L)  Chloride 96 - 106 mmol/L 111(H) 108 103  CO2 20 - 29 mmol/L 19(L) 24 24  Calcium 8.7 - 10.3 mg/dL 8.5(L) 8.1(L) 8.8(L)  Total Protein 6.5 - 8.1 g/dL - - 6.0(L)  Total Bilirubin 0.3 -  1.2 mg/dL - - 2.0(H)  Alkaline Phos 38 - 126 U/L - - 101  AST 15 - 41 U/L - - 37  ALT 0 - 44 U/L - - 32    Hepatic Function Latest Ref Rng & Units 08/25/2018 07/31/2018 07/28/2018  Total Protein 6.5 - 8.1 g/dL 6.0(L) 5.6(L) 5.5(L)  Albumin 3.5 - 5.0 g/dL 2.7(L) 2.4(L) 2.3(L)  AST 15 - 41 U/L 37 45(H) 33  ALT 0 - 44 U/L 32 33 27  Alk Phosphatase 38 - 126 U/L 101 135(H) 120  Total Bilirubin 0.3 - 1.2 mg/dL 2.0(H) 3.0(H) 3.2(H)  Bilirubin, Direct 0.0 - 0.2 mg/dL 0.5(H) - -     Assessment:  #1.  Edema involving left upper  extremity and left breast or in the region of axillary subclavian vein drainage area.  She does not have axillary neuropathy.  She also does not have any changes to suggest cellulitis involving left upper extremity or breast.  One has to worry about clotting disorder although that would be less likely given chronic thrombocytopenia.  #2.  Anemia secondary to chronic GI bleed from gave.  She received a unit of PRBCs last week.  Hemoglobin now is 7.6.  It will be rechecked in 1 week.  #3.  Hepatic encephalopathy.  She is doing well with lactulose and Xifaxan.  #4.  Fluid overload.  I suspect fluid overload has gotten worse in the setting of anemia.  She is responding to higher dose of diuretic.   Plan:  Stat Doppler study of left upper extremity and subclavian vein. CBC and metabolic 7 in 1 week. Further recommendations to follow. I also contacted patient's transplant coordinator Ms. Neysa Bonito but have not heard from her. Office visit in 8 weeks.

## 2019-01-27 NOTE — Patient Instructions (Signed)
Physician will call with results of Doppler study when completed.

## 2019-01-28 ENCOUNTER — Other Ambulatory Visit (INDEPENDENT_AMBULATORY_CARE_PROVIDER_SITE_OTHER): Payer: Self-pay | Admitting: *Deleted

## 2019-01-28 ENCOUNTER — Telehealth (INDEPENDENT_AMBULATORY_CARE_PROVIDER_SITE_OTHER): Payer: Self-pay | Admitting: *Deleted

## 2019-01-28 DIAGNOSIS — K7460 Unspecified cirrhosis of liver: Secondary | ICD-10-CM

## 2019-01-28 DIAGNOSIS — K7682 Hepatic encephalopathy: Secondary | ICD-10-CM

## 2019-01-28 DIAGNOSIS — K7290 Hepatic failure, unspecified without coma: Secondary | ICD-10-CM

## 2019-01-28 NOTE — Telephone Encounter (Signed)
Patient to have B-Met drawn on 02/03/2019 , when she has H&H.

## 2019-01-28 NOTE — Unmapped (Signed)
VM received from Dr. Karilyn Cota, [facility] local GI MD with concern that despite aggressive APC via EGD, patient continues to require weekly blood transfusions. Dr. Karilyn Cota requests EGD be performed @ [facility]. Return call and VM left for Dr. Karilyn Cota, assuring him concerns to be discussed with primary hepatologist, Dr. Ruffin Frederick.     Call placed and spoke with [name] - made her aware that Dr. Karilyn Cota had reached out and coordination of her care was taking place.     Message sent to Dr. Ruffin Frederick, awaiting reply to proceed with EGD @ [facility].

## 2019-01-30 ENCOUNTER — Other Ambulatory Visit (INDEPENDENT_AMBULATORY_CARE_PROVIDER_SITE_OTHER): Payer: Self-pay | Admitting: *Deleted

## 2019-01-30 DIAGNOSIS — K31819 Angiodysplasia of stomach and duodenum without bleeding: Secondary | ICD-10-CM

## 2019-01-30 DIAGNOSIS — K760 Fatty (change of) liver, not elsewhere classified: Secondary | ICD-10-CM

## 2019-01-30 DIAGNOSIS — K7460 Unspecified cirrhosis of liver: Secondary | ICD-10-CM

## 2019-01-30 DIAGNOSIS — I8500 Esophageal varices without bleeding: Secondary | ICD-10-CM

## 2019-01-31 ENCOUNTER — Other Ambulatory Visit (HOSPITAL_COMMUNITY): Payer: Self-pay | Admitting: Student

## 2019-02-03 ENCOUNTER — Other Ambulatory Visit: Payer: Self-pay

## 2019-02-03 ENCOUNTER — Other Ambulatory Visit (HOSPITAL_COMMUNITY)
Admission: RE | Admit: 2019-02-03 | Discharge: 2019-02-03 | Disposition: A | Discharge status: Home / Self Care | Payer: PPO | Source: Ambulatory Visit | Attending: Internal Medicine | Admitting: Internal Medicine

## 2019-02-03 DIAGNOSIS — K31819 Angiodysplasia of stomach and duodenum without bleeding: Secondary | ICD-10-CM | POA: Diagnosis not present

## 2019-02-03 DIAGNOSIS — I8500 Esophageal varices without bleeding: Secondary | ICD-10-CM | POA: Insufficient documentation

## 2019-02-03 DIAGNOSIS — K760 Fatty (change of) liver, not elsewhere classified: Secondary | ICD-10-CM | POA: Insufficient documentation

## 2019-02-03 DIAGNOSIS — K7460 Unspecified cirrhosis of liver: Secondary | ICD-10-CM | POA: Diagnosis not present

## 2019-02-03 LAB — COMPREHENSIVE METABOLIC PANEL
ALT: 27 U/L (ref 0–44)
AST: 35 U/L (ref 15–41)
Albumin: 2.8 g/dL — ABNORMAL LOW (ref 3.5–5.0)
Alkaline Phosphatase: 93 U/L (ref 38–126)
Anion gap: 8 (ref 5–15)
BUN: 14 mg/dL (ref 8–23)
CO2: 21 mmol/L — ABNORMAL LOW (ref 22–32)
Calcium: 8.2 mg/dL — ABNORMAL LOW (ref 8.9–10.3)
Chloride: 111 mmol/L (ref 98–111)
Creatinine, Ser: 0.7 mg/dL (ref 0.44–1.00)
GFR calc Af Amer: >60 mL/min (ref >60)
GFR calc non Af Amer: >60 mL/min (ref >60)
Glucose, Bld: 169 mg/dL — ABNORMAL HIGH (ref 70–99)
Potassium: 4 mmol/L (ref 3.5–5.1)
Sodium: 140 mmol/L (ref 135–145)
Total Bilirubin: 2.6 mg/dL — ABNORMAL HIGH (ref 0.3–1.2)
Total Protein: 5.6 g/dL — ABNORMAL LOW (ref 6.5–8.1)

## 2019-02-03 LAB — TSH: TSH: 1.484 u[IU]/mL (ref 0.350–4.500)

## 2019-02-03 LAB — PROTIME-INR
INR: 1.5 — ABNORMAL HIGH (ref 0.8–1.2)
Prothrombin Time: 17.9 seconds — ABNORMAL HIGH (ref 11.4–15.2)

## 2019-02-04 ENCOUNTER — Other Ambulatory Visit (HOSPITAL_COMMUNITY)
Admission: RE | Admit: 2019-02-04 | Discharge: 2019-02-04 | Disposition: A | Discharge status: Home / Self Care | Payer: PPO | Source: Ambulatory Visit | Attending: Internal Medicine | Admitting: Internal Medicine

## 2019-02-04 ENCOUNTER — Other Ambulatory Visit (INDEPENDENT_AMBULATORY_CARE_PROVIDER_SITE_OTHER): Payer: Self-pay | Admitting: *Deleted

## 2019-02-04 ENCOUNTER — Other Ambulatory Visit (HOSPITAL_COMMUNITY): Payer: Self-pay | Admitting: *Deleted

## 2019-02-04 DIAGNOSIS — D649 Anemia, unspecified: Secondary | ICD-10-CM

## 2019-02-04 DIAGNOSIS — K7469 Other cirrhosis of liver: Secondary | ICD-10-CM | POA: Diagnosis not present

## 2019-02-04 LAB — CBC WITH DIFFERENTIAL/PLATELET
Abs Immature Granulocytes: 0.01 10*3/uL (ref 0.00–0.07)
Basophils Absolute: 0 10*3/uL (ref 0.0–0.1)
Basophils Relative: 1 %
Eosinophils Absolute: 0.1 10*3/uL (ref 0.0–0.5)
Eosinophils Relative: 3 %
HCT: 25.7 % — ABNORMAL LOW (ref 36.0–46.0)
Hemoglobin: 6.9 g/dL — CL (ref 12.0–15.0)
Immature Granulocytes: 0 %
Lymphocytes Relative: 33 %
Lymphs Abs: 0.9 10*3/uL (ref 0.7–4.0)
MCH: 24 pg — ABNORMAL LOW (ref 26.0–34.0)
MCHC: 26.8 g/dL — ABNORMAL LOW (ref 30.0–36.0)
MCV: 89.5 fL (ref 80.0–100.0)
Monocytes Absolute: 0.3 10*3/uL (ref 0.1–1.0)
Monocytes Relative: 10 %
Neutro Abs: 1.5 10*3/uL — ABNORMAL LOW (ref 1.7–7.7)
Neutrophils Relative %: 53 %
Platelets: 54 10*3/uL — ABNORMAL LOW (ref 150–400)
RBC: 2.87 MIL/uL — ABNORMAL LOW (ref 3.87–5.11)
RDW: 18.5 % — ABNORMAL HIGH (ref 11.5–15.5)
WBC: 2.8 10*3/uL — ABNORMAL LOW (ref 4.0–10.5)
nRBC: 0 % (ref 0.0–0.2)

## 2019-02-04 LAB — SAMPLE TO BLOOD BANK

## 2019-02-04 LAB — PREPARE RBC (CROSSMATCH)

## 2019-02-05 ENCOUNTER — Other Ambulatory Visit: Payer: Self-pay

## 2019-02-05 ENCOUNTER — Encounter (HOSPITAL_COMMUNITY)
Admission: RE | Admit: 2019-02-05 | Discharge: 2019-02-05 | Disposition: A | Discharge status: Home / Self Care | Payer: PPO | Source: Ambulatory Visit | Attending: Internal Medicine | Admitting: Internal Medicine

## 2019-02-05 ENCOUNTER — Encounter (HOSPITAL_COMMUNITY): Payer: Self-pay

## 2019-02-05 DIAGNOSIS — K7469 Other cirrhosis of liver: Secondary | ICD-10-CM | POA: Diagnosis not present

## 2019-02-05 MED ORDER — SODIUM CHLORIDE 0.9% IV SOLUTION
Freq: Once | INTRAVENOUS | Status: AC
  Administered 2019-02-05: 09:00:00 via INTRAVENOUS

## 2019-02-05 MED ORDER — ACETAMINOPHEN 325 MG PO TABS
650.0000 mg | ORAL_TABLET | Freq: Once | ORAL | Status: AC
  Administered 2019-02-05: 650 mg via ORAL

## 2019-02-05 MED ORDER — DIPHENHYDRAMINE HCL 25 MG PO CAPS
25.0000 mg | ORAL_CAPSULE | Freq: Once | ORAL | Status: AC
  Administered 2019-02-05: 25 mg via ORAL

## 2019-02-06 DIAGNOSIS — R069 Unspecified abnormalities of breathing: Secondary | ICD-10-CM | POA: Diagnosis not present

## 2019-02-06 LAB — BPAM RBC
Blood Product Expiration Date: 202006132359
ISSUE DATE / TIME: 202005200939
Unit Type and Rh: 9500

## 2019-02-06 LAB — TYPE AND SCREEN
ABO/RH(D): O NEG
Antibody Screen: NEGATIVE
Unit division: 0

## 2019-02-07 MED ORDER — SERTRALINE 50 MG TABLET
ORAL_TABLET | Freq: Every day | ORAL | 1 refills | 0.00000 days | Status: CP
Start: 2019-02-07 — End: 2019-03-07

## 2019-02-13 ENCOUNTER — Other Ambulatory Visit: Payer: Self-pay

## 2019-02-13 ENCOUNTER — Other Ambulatory Visit (HOSPITAL_COMMUNITY)
Admission: RE | Admit: 2019-02-13 | Discharge: 2019-02-13 | Disposition: A | Discharge status: Home / Self Care | Payer: PPO | Source: Ambulatory Visit | Attending: Internal Medicine | Admitting: Internal Medicine

## 2019-02-13 ENCOUNTER — Encounter (INDEPENDENT_AMBULATORY_CARE_PROVIDER_SITE_OTHER): Payer: PPO | Admitting: Ophthalmology

## 2019-02-13 DIAGNOSIS — H2513 Age-related nuclear cataract, bilateral: Secondary | ICD-10-CM

## 2019-02-13 DIAGNOSIS — I8500 Esophageal varices without bleeding: Secondary | ICD-10-CM | POA: Insufficient documentation

## 2019-02-13 DIAGNOSIS — E113391 Type 2 diabetes mellitus with moderate nonproliferative diabetic retinopathy without macular edema, right eye: Secondary | ICD-10-CM

## 2019-02-13 DIAGNOSIS — K7460 Unspecified cirrhosis of liver: Secondary | ICD-10-CM | POA: Insufficient documentation

## 2019-02-13 DIAGNOSIS — I10 Essential (primary) hypertension: Secondary | ICD-10-CM

## 2019-02-13 DIAGNOSIS — E11311 Type 2 diabetes mellitus with unspecified diabetic retinopathy with macular edema: Secondary | ICD-10-CM | POA: Diagnosis not present

## 2019-02-13 DIAGNOSIS — H43813 Vitreous degeneration, bilateral: Secondary | ICD-10-CM | POA: Diagnosis not present

## 2019-02-13 DIAGNOSIS — K31819 Angiodysplasia of stomach and duodenum without bleeding: Secondary | ICD-10-CM | POA: Diagnosis not present

## 2019-02-13 DIAGNOSIS — K7469 Other cirrhosis of liver: Secondary | ICD-10-CM | POA: Diagnosis not present

## 2019-02-13 DIAGNOSIS — H35033 Hypertensive retinopathy, bilateral: Secondary | ICD-10-CM | POA: Diagnosis not present

## 2019-02-13 DIAGNOSIS — E113312 Type 2 diabetes mellitus with moderate nonproliferative diabetic retinopathy with macular edema, left eye: Secondary | ICD-10-CM | POA: Diagnosis not present

## 2019-02-13 LAB — HEMOGLOBIN AND HEMATOCRIT, BLOOD
HCT: 22.6 % — ABNORMAL LOW (ref 36.0–46.0)
Hemoglobin: 6.4 g/dL — CL (ref 12.0–15.0)

## 2019-02-13 LAB — PREPARE RBC (CROSSMATCH)

## 2019-02-13 MED ORDER — SODIUM CHLORIDE 0.9% IV SOLUTION: Freq: Once | INTRAVENOUS | Status: DC

## 2019-02-14 ENCOUNTER — Encounter (HOSPITAL_COMMUNITY)
Admission: RE | Admit: 2019-02-14 | Discharge: 2019-02-14 | Disposition: A | Discharge status: Home / Self Care | Payer: PPO | Source: Ambulatory Visit | Attending: Internal Medicine | Admitting: Internal Medicine

## 2019-02-14 DIAGNOSIS — K7469 Other cirrhosis of liver: Secondary | ICD-10-CM | POA: Diagnosis not present

## 2019-02-14 MED ORDER — DIPHENHYDRAMINE HCL 25 MG PO CAPS
25.0000 mg | ORAL_CAPSULE | Freq: Once | ORAL | Status: AC
  Administered 2019-02-14: 25 mg via ORAL

## 2019-02-14 MED ORDER — ACETAMINOPHEN 325 MG PO TABS
ORAL_TABLET | ORAL | Status: AC
  Filled 2019-02-14: qty 2

## 2019-02-14 MED ORDER — ACETAMINOPHEN 325 MG PO TABS
650.0000 mg | ORAL_TABLET | Freq: Once | ORAL | Status: AC
  Administered 2019-02-14: 650 mg via ORAL

## 2019-02-14 MED ORDER — DIPHENHYDRAMINE HCL 25 MG PO CAPS
ORAL_CAPSULE | ORAL | Status: AC
  Filled 2019-02-14: qty 1

## 2019-02-14 MED ORDER — SODIUM CHLORIDE 0.9% IV SOLUTION
Freq: Once | INTRAVENOUS | Status: AC
  Administered 2019-02-14: 12:00:00 via INTRAVENOUS

## 2019-02-14 NOTE — Unmapped (Signed)
Return call placed and spoke with Marilyn French husband. He is inquiring into need for follow up radiology procedure related to her TIPS. Assured him I would discuss with Dr. Ruffin Frederick and contact him next week with recommendations. Also inquired into status of EGD @ [facility] as requested by Dr. Karilyn Cota. Order to be placed once confirmed with Dr. Claudean Severance.

## 2019-02-15 LAB — TYPE AND SCREEN
ABO/RH(D): O NEG
Antibody Screen: NEGATIVE
Unit division: 0

## 2019-02-15 LAB — BPAM RBC
Blood Product Expiration Date: 202006102359
ISSUE DATE / TIME: 202005291116
Unit Type and Rh: 9500

## 2019-02-19 NOTE — Unmapped (Signed)
Call placed to [facility] regarding a change in her appt.  Per the provider's request her appt should be changed to video/phone visit.  Patient stated she prefers phone visit but would like another day because she was afraid her phone calls would drop on the way to [facility] for her MRI.  Patient verbally agreed to phone visit on 6/8.

## 2019-02-20 NOTE — Unmapped (Signed)
Verbally spoke to patients spouse who confirmed [facility] Pretest 6/10 10am

## 2019-02-20 NOTE — Unmapped (Signed)
-----   Message from Eleanora Neighbor sent at 02/19/2019  2:50 PM EDT -----  Regarding: please schedule closer to [place]  Please schedule in [place]    Patient has a care plan that requires asymptomatic respiratory testing prior to implementation.   The date 6/12, time 930am of the treatment, surgery, or procedure for which they need testing is egd.  Please schedule a COVID PRE TEST appointment in [facility] Quick [facility].

## 2019-02-21 ENCOUNTER — Ambulatory Visit: Admit: 2019-02-21 | Discharge: 2019-02-21 | Payer: PRIVATE HEALTH INSURANCE

## 2019-02-21 DIAGNOSIS — K7290 Hepatic failure, unspecified without coma: Principal | ICD-10-CM

## 2019-02-21 DIAGNOSIS — K7469 Other cirrhosis of liver: Secondary | ICD-10-CM | POA: Diagnosis not present

## 2019-02-21 DIAGNOSIS — K7460 Unspecified cirrhosis of liver: Secondary | ICD-10-CM | POA: Diagnosis not present

## 2019-02-21 DIAGNOSIS — K766 Portal hypertension: Secondary | ICD-10-CM | POA: Diagnosis not present

## 2019-02-21 DIAGNOSIS — Z9889 Other specified postprocedural states: Secondary | ICD-10-CM | POA: Diagnosis not present

## 2019-02-21 DIAGNOSIS — I868 Varicose veins of other specified sites: Secondary | ICD-10-CM | POA: Diagnosis not present

## 2019-02-21 DIAGNOSIS — R161 Splenomegaly, not elsewhere classified: Secondary | ICD-10-CM | POA: Diagnosis not present

## 2019-02-21 LAB — CREATININE, WHOLE BLOOD
CREATININE, WHOLE BLOOD: 0.9 mg/dL (ref 0.7–1.1)
EGFR CKD-EPI AA FEMALE (WB): 79 mL/min/{1.73_m2} (ref >=60)

## 2019-02-21 LAB — EGFR CKD-EPI AA FEMALE (WB): Lab: 79

## 2019-02-24 ENCOUNTER — Encounter (HOSPITAL_COMMUNITY)
Admission: RE | Admit: 2019-02-24 | Discharge: 2019-02-24 | Disposition: A | Discharge status: Home / Self Care | Payer: PPO | Source: Ambulatory Visit | Attending: Internal Medicine | Admitting: Internal Medicine

## 2019-02-24 ENCOUNTER — Other Ambulatory Visit: Payer: Self-pay

## 2019-02-24 ENCOUNTER — Other Ambulatory Visit (INDEPENDENT_AMBULATORY_CARE_PROVIDER_SITE_OTHER): Payer: Self-pay | Admitting: *Deleted

## 2019-02-24 ENCOUNTER — Encounter (HOSPITAL_COMMUNITY): Payer: Self-pay

## 2019-02-24 ENCOUNTER — Observation Stay (HOSPITAL_COMMUNITY)
Admission: AD | Admit: 2019-02-24 | Discharge: 2019-02-25 | Disposition: A | Discharge status: Home / Self Care | Payer: PPO | Source: Ambulatory Visit | Attending: Internal Medicine | Admitting: Internal Medicine

## 2019-02-24 ENCOUNTER — Institutional Professional Consult (permissible substitution)
Admit: 2019-02-24 | Discharge: 2019-02-25 | Payer: PRIVATE HEALTH INSURANCE | Attending: Health Service | Primary: Health Service

## 2019-02-24 DIAGNOSIS — Z01818 Encounter for other preprocedural examination: Principal | ICD-10-CM

## 2019-02-24 DIAGNOSIS — E039 Hypothyroidism, unspecified: Secondary | ICD-10-CM | POA: Insufficient documentation

## 2019-02-24 DIAGNOSIS — D508 Other iron deficiency anemias: Secondary | ICD-10-CM | POA: Insufficient documentation

## 2019-02-24 DIAGNOSIS — Z1159 Encounter for screening for other viral diseases: Secondary | ICD-10-CM | POA: Insufficient documentation

## 2019-02-24 DIAGNOSIS — G9341 Metabolic encephalopathy: Secondary | ICD-10-CM | POA: Insufficient documentation

## 2019-02-24 DIAGNOSIS — K219 Gastro-esophageal reflux disease without esophagitis: Secondary | ICD-10-CM | POA: Diagnosis not present

## 2019-02-24 DIAGNOSIS — Z794 Long term (current) use of insulin: Secondary | ICD-10-CM | POA: Diagnosis not present

## 2019-02-24 DIAGNOSIS — K7469 Other cirrhosis of liver: Secondary | ICD-10-CM | POA: Insufficient documentation

## 2019-02-24 DIAGNOSIS — E119 Type 2 diabetes mellitus without complications: Secondary | ICD-10-CM | POA: Diagnosis not present

## 2019-02-24 DIAGNOSIS — Z79899 Other long term (current) drug therapy: Secondary | ICD-10-CM | POA: Diagnosis not present

## 2019-02-24 DIAGNOSIS — D649 Anemia, unspecified: Secondary | ICD-10-CM | POA: Diagnosis present

## 2019-02-24 DIAGNOSIS — K760 Fatty (change of) liver, not elsewhere classified: Secondary | ICD-10-CM | POA: Diagnosis not present

## 2019-02-24 DIAGNOSIS — I110 Hypertensive heart disease with heart failure: Secondary | ICD-10-CM | POA: Diagnosis not present

## 2019-02-24 DIAGNOSIS — D500 Iron deficiency anemia secondary to blood loss (chronic): Principal | ICD-10-CM | POA: Insufficient documentation

## 2019-02-24 DIAGNOSIS — K7290 Hepatic failure, unspecified without coma: Secondary | ICD-10-CM | POA: Diagnosis not present

## 2019-02-24 DIAGNOSIS — I509 Heart failure, unspecified: Secondary | ICD-10-CM | POA: Diagnosis not present

## 2019-02-24 LAB — BASIC METABOLIC PANEL
Anion gap: 8 (ref 5–15)
BUN: 18 mg/dL (ref 8–23)
CO2: 20 mmol/L — ABNORMAL LOW (ref 22–32)
Calcium: 8.7 mg/dL — ABNORMAL LOW (ref 8.9–10.3)
Chloride: 112 mmol/L — ABNORMAL HIGH (ref 98–111)
Creatinine, Ser: 0.93 mg/dL (ref 0.44–1.00)
GFR calc Af Amer: >60 mL/min (ref >60)
GFR calc non Af Amer: >60 mL/min (ref >60)
Glucose, Bld: 230 mg/dL — ABNORMAL HIGH (ref 70–99)
Potassium: 4.2 mmol/L (ref 3.5–5.1)
Sodium: 140 mmol/L (ref 135–145)

## 2019-02-24 LAB — CBC
HCT: 17.6 % — ABNORMAL LOW (ref 36.0–46.0)
Hemoglobin: 4.8 g/dL — CL (ref 12.0–15.0)
MCH: 24.9 pg — ABNORMAL LOW (ref 26.0–34.0)
MCHC: 27.3 g/dL — ABNORMAL LOW (ref 30.0–36.0)
MCV: 91.2 fL (ref 80.0–100.0)
Platelets: 65 10*3/uL — ABNORMAL LOW (ref 150–400)
RBC: 1.93 MIL/uL — ABNORMAL LOW (ref 3.87–5.11)
RDW: 20.3 % — ABNORMAL HIGH (ref 11.5–15.5)
WBC: 3.3 10*3/uL — ABNORMAL LOW (ref 4.0–10.5)
nRBC: 0 % (ref 0.0–0.2)

## 2019-02-24 LAB — GLUCOSE, CAPILLARY
Glucose-Capillary: 193 mg/dL — ABNORMAL HIGH (ref 70–99)
Glucose-Capillary: 74 mg/dL (ref 70–99)

## 2019-02-24 LAB — SAMPLE TO BLOOD BANK

## 2019-02-24 LAB — AMMONIA: Ammonia: 57 umol/L — ABNORMAL HIGH (ref 9–35)

## 2019-02-24 LAB — PREPARE RBC (CROSSMATCH)

## 2019-02-24 LAB — SARS CORONAVIRUS 2 BY RT PCR (HOSPITAL ORDER, PERFORMED IN ~~LOC~~ HOSPITAL LAB): SARS Coronavirus 2: NEGATIVE

## 2019-02-24 MED ORDER — SODIUM CHLORIDE 0.9 % IV SOLN: 250.0000 mL | INTRAVENOUS | Status: DC | PRN

## 2019-02-24 MED ORDER — ROSUVASTATIN CALCIUM 10 MG PO TABS
5.0000 mg | ORAL_TABLET | ORAL | Status: DC
  Administered 2019-02-25: 5 mg via ORAL
  Filled 2019-02-24: qty 1

## 2019-02-24 MED ORDER — ADULT MULTIVITAMIN W/MINERALS CH
1.0000 | ORAL_TABLET | Freq: Every day | ORAL | Status: DC
  Administered 2019-02-25: 1 via ORAL
  Filled 2019-02-24: qty 1

## 2019-02-24 MED ORDER — RIFAXIMIN 550 MG PO TABS
550.0000 mg | ORAL_TABLET | Freq: Two times a day (BID) | ORAL | Status: DC
  Administered 2019-02-24 – 2019-02-25 (×2): 550 mg via ORAL
  Filled 2019-02-24 (×2): qty 1

## 2019-02-24 MED ORDER — INSULIN ASPART 100 UNIT/ML ~~LOC~~ SOLN
0.0000 [IU] | Freq: Three times a day (TID) | SUBCUTANEOUS | Status: DC
  Administered 2019-02-24: 3 [IU] via SUBCUTANEOUS
  Administered 2019-02-25: 12:00:00 5 [IU] via SUBCUTANEOUS

## 2019-02-24 MED ORDER — TORSEMIDE 20 MG PO TABS
20.0000 mg | ORAL_TABLET | Freq: Every day | ORAL | Status: DC
  Administered 2019-02-25: 20 mg via ORAL
  Filled 2019-02-24: qty 1

## 2019-02-24 MED ORDER — POTASSIUM CHLORIDE CRYS ER 10 MEQ PO TBCR
10.0000 meq | EXTENDED_RELEASE_TABLET | Freq: Two times a day (BID) | ORAL | Status: DC
  Administered 2019-02-24 – 2019-02-25 (×2): 10 meq via ORAL
  Filled 2019-02-24 (×6): qty 1

## 2019-02-24 MED ORDER — ONDANSETRON HCL 4 MG PO TABS: 4.0000 mg | ORAL_TABLET | Freq: Four times a day (QID) | ORAL | Status: DC | PRN

## 2019-02-24 MED ORDER — SODIUM CHLORIDE 0.9% IV SOLUTION: Freq: Once | INTRAVENOUS | Status: DC

## 2019-02-24 MED ORDER — VITAMIN D 25 MCG (1000 UNIT) PO TABS: 1000.0000 [IU] | ORAL_TABLET | ORAL | Status: DC

## 2019-02-24 MED ORDER — SODIUM CHLORIDE 0.9% FLUSH
3.0000 mL | Freq: Two times a day (BID) | INTRAVENOUS | Status: DC
  Administered 2019-02-24 – 2019-02-25 (×2): 3 mL via INTRAVENOUS

## 2019-02-24 MED ORDER — DIPHENHYDRAMINE HCL 25 MG PO CAPS
25.0000 mg | ORAL_CAPSULE | Freq: Once | ORAL | Status: AC
  Administered 2019-02-24: 25 mg via ORAL
  Filled 2019-02-24: qty 1

## 2019-02-24 MED ORDER — FUROSEMIDE 10 MG/ML IJ SOLN
40.0000 mg | Freq: Once | INTRAMUSCULAR | Status: AC
  Administered 2019-02-24: 40 mg via INTRAVENOUS
  Filled 2019-02-24: qty 4

## 2019-02-24 MED ORDER — POLYMYXIN B-TRIMETHOPRIM 10000-0.1 UNIT/ML-% OP SOLN: 1.0000 [drp] | OPHTHALMIC | Status: DC

## 2019-02-24 MED ORDER — BUSPIRONE HCL 5 MG PO TABS
10.0000 mg | ORAL_TABLET | Freq: Two times a day (BID) | ORAL | Status: DC
  Administered 2019-02-24 – 2019-02-25 (×2): 10 mg via ORAL
  Filled 2019-02-24 (×2): qty 2

## 2019-02-24 MED ORDER — ACETAMINOPHEN 325 MG PO TABS
650.0000 mg | ORAL_TABLET | Freq: Four times a day (QID) | ORAL | Status: DC | PRN
  Administered 2019-02-24: 650 mg via ORAL
  Filled 2019-02-24 (×2): qty 2

## 2019-02-24 MED ORDER — INSULIN ASPART 100 UNIT/ML ~~LOC~~ SOLN
15.0000 [IU] | Freq: Three times a day (TID) | SUBCUTANEOUS | Status: DC
  Administered 2019-02-24 – 2019-02-25 (×3): 15 [IU] via SUBCUTANEOUS

## 2019-02-24 MED ORDER — LACTULOSE 10 GM/15ML PO SOLN
20.0000 g | Freq: Four times a day (QID) | ORAL | Status: DC
  Administered 2019-02-24 – 2019-02-25 (×2): 20 g via ORAL
  Filled 2019-02-24 (×3): qty 30

## 2019-02-24 MED ORDER — SPIRONOLACTONE 25 MG PO TABS
50.0000 mg | ORAL_TABLET | Freq: Two times a day (BID) | ORAL | Status: DC
  Administered 2019-02-24 – 2019-02-25 (×2): 50 mg via ORAL
  Filled 2019-02-24 (×2): qty 2

## 2019-02-24 MED ORDER — ONDANSETRON HCL 4 MG/2ML IJ SOLN: 4.0000 mg | Freq: Four times a day (QID) | INTRAMUSCULAR | Status: DC | PRN

## 2019-02-24 MED ORDER — PANTOPRAZOLE SODIUM 40 MG PO TBEC
40.0000 mg | DELAYED_RELEASE_TABLET | Freq: Two times a day (BID) | ORAL | Status: DC
  Administered 2019-02-24 – 2019-02-25 (×2): 40 mg via ORAL
  Filled 2019-02-24 (×2): qty 1

## 2019-02-24 MED ORDER — INSULIN GLARGINE 100 UNIT/ML ~~LOC~~ SOLN
45.0000 [IU] | Freq: Every morning | SUBCUTANEOUS | Status: DC
  Administered 2019-02-25: 45 [IU] via SUBCUTANEOUS
  Filled 2019-02-24 (×2): qty 0.45

## 2019-02-24 MED ORDER — LEVOTHYROXINE SODIUM 25 MCG PO TABS
25.0000 ug | ORAL_TABLET | Freq: Every day | ORAL | Status: DC
  Administered 2019-02-25: 25 ug via ORAL
  Filled 2019-02-24: qty 1

## 2019-02-24 MED ORDER — ZINC SULFATE 220 (50 ZN) MG PO CAPS
220.0000 mg | ORAL_CAPSULE | Freq: Every day | ORAL | Status: DC
  Administered 2019-02-25: 09:00:00 220 mg via ORAL
  Filled 2019-02-24: qty 1

## 2019-02-24 MED ORDER — SERTRALINE HCL 50 MG PO TABS
50.0000 mg | ORAL_TABLET | Freq: Every day | ORAL | Status: DC
  Administered 2019-02-25: 50 mg via ORAL
  Filled 2019-02-24: qty 1

## 2019-02-24 MED ORDER — ACETAMINOPHEN 650 MG RE SUPP: 650.0000 mg | Freq: Four times a day (QID) | RECTAL | Status: DC | PRN

## 2019-02-24 MED ORDER — SODIUM CHLORIDE 0.9% FLUSH: 3.0000 mL | INTRAVENOUS | Status: DC | PRN

## 2019-02-24 MED ORDER — MAGNESIUM OXIDE 400 (241.3 MG) MG PO TABS
400.0000 mg | ORAL_TABLET | Freq: Every day | ORAL | Status: DC
  Administered 2019-02-25: 400 mg via ORAL
  Filled 2019-02-24: qty 1

## 2019-02-24 NOTE — H&P (Signed)
History and Physical    Maria [name]:[id number] DOB: 07/29/1956 DOA: 02/24/2019  PCP: Glenda Chroman, MD   Patient coming from: Home  Chief Complaint: Low hemoglobin  HPI: Maria [name] is a 63 y.o. female with medical history significant for diabetes, hypertension, hypothyroidism, depression, CHF, GAVE, and cirrhosis secondary to NASH complicated by esophageal varices and hepatic encephalopathy who is followed at [facility] liver clinic as well as locally with Dr. Laural Golden.  She also has a history of chronic GI blood loss from her various GI issues and had asked for repeat hemoglobin and hematocrit level today and was noted to have a hemoglobin level of 4.8 from a prior hemoglobin of 6.4 on 02/13/2019.  She appears to have severe anemia chronically runs with a hemoglobin around 7.  She has been directly admitted per recommendations by Dr. Laural Golden for PRBC transfusion.  She denies any new changes to her abdominal pain and continues to have some dark stools which is fairly normal for her.  Vital signs are currently stable.    Review of Systems: All others reviewed and otherwise negative.  Past Medical History:  Diagnosis Date   Anemia    CHF (congestive heart failure) (HCC)    Cirrhosis (HCC)    Depression    Diabetes mellitus    Esophageal bleed, non-variceal    Eye hemorrhage    Behind left eye   Fibromyalgia    Hypercholesteremia    Hypertension    Hypothyroidism    NAFLD (nonalcoholic fatty liver disease)    Osteoarthrosis    Osteoporosis    PONV (postoperative nausea and vomiting)    UTI (urinary tract infection) 11/12 end of the month   Patient feels that she passed a kidney stone at that time    Past Surgical History:  Procedure Laterality Date   APPENDECTOMY  1980   BILATERAL SALPINGOOPHORECTOMY     CHOLECYSTECTOMY     COLONOSCOPY  03/15/2011   COLONOSCOPY N/A 03/24/2016   Procedure: COLONOSCOPY;  Surgeon: [name], MD;   [facility];  Service: Endoscopy;  Laterality: N/A;  855   ESOPHAGEAL BANDING  04/24/2012   Procedure: ESOPHAGEAL BANDING;  Surgeon: [name], MD;  [facility];  Service: Endoscopy;  Laterality: N/A;   Esophageal BANDING  08/03/2012   [facility] in [place] , [place]  09/24/2012   Procedure: ESOPHAGEAL BANDING;  Surgeon: [name], MD;  Location: [facility];  Service: Endoscopy;  Laterality: N/A;  Banding x 3   ESOPHAGEAL BANDING N/A 01/29/2013   Procedure: ESOPHAGEAL BANDING;  Surgeon: [name], MD;  [facility];  Service: Endoscopy;  Laterality: N/A;   ESOPHAGEAL BANDING N/A 06/19/2014   Procedure: ESOPHAGEAL BANDING;  Surgeon: [name], MD;  [facility];  Service: Endoscopy;  Laterality: N/A;   ESOPHAGEAL BANDING N/A 01/05/2016   Procedure: ESOPHAGEAL BANDING;  Surgeon: [name], MD;  [facility];  Service: Endoscopy;  Laterality: N/A;   ESOPHAGEAL BANDING N/A 08/03/2016   Procedure: ESOPHAGEAL BANDING;  Surgeon: [name], MD;  [facility];  Service: Endoscopy;  Laterality: N/A;   ESOPHAGEAL BANDING N/A 05/02/2017   Procedure: ESOPHAGEAL BANDING;  Surgeon: [name], MD;  [facility];  Service: Endoscopy;  Laterality: N/A;   ESOPHAGOGASTRODUODENOSCOPY  04/24/2012   Procedure: ESOPHAGOGASTRODUODENOSCOPY (EGD);  Surgeon: [name], MD;  [facility];  Service: Endoscopy;  Laterality: N/A;  300   ESOPHAGOGASTRODUODENOSCOPY N/A 01/29/2013   Procedure: ESOPHAGOGASTRODUODENOSCOPY (EGD);  Surgeon: [name], MD;  [facility];  Service: Endoscopy;  Laterality: N/A;  1200   ESOPHAGOGASTRODUODENOSCOPY N/A 05/22/2013   Procedure: ESOPHAGOGASTRODUODENOSCOPY (EGD);  Surgeon: [name], MD;  [facility];  Service: Endoscopy;  Laterality: N/A;  1:55   ESOPHAGOGASTRODUODENOSCOPY N/A 12/31/2013    Procedure: ESOPHAGOGASTRODUODENOSCOPY (EGD);  Surgeon: [name], MD;  [facility];  Service: Endoscopy;  Laterality: N/A;  1200   ESOPHAGOGASTRODUODENOSCOPY N/A 06/19/2014   Procedure: ESOPHAGOGASTRODUODENOSCOPY (EGD);  Surgeon: [name], MD;  [facility];  Service: Endoscopy;  Laterality: N/A;  1055   ESOPHAGOGASTRODUODENOSCOPY N/A 01/15/2015   Procedure: ESOPHAGOGASTRODUODENOSCOPY (EGD);  Surgeon: [name], MD;  [facility];  Service: Endoscopy;  Laterality: N/A;  730 - moved to 9:45 - moved to 1250-Ann notified pt   ESOPHAGOGASTRODUODENOSCOPY N/A 06/30/2015   Procedure: ESOPHAGOGASTRODUODENOSCOPY (EGD);  Surgeon: [name], MD;  [facility];  Service: Endoscopy;  Laterality: N/A;  1200   ESOPHAGOGASTRODUODENOSCOPY N/A 01/05/2016   Procedure: ESOPHAGOGASTRODUODENOSCOPY (EGD);  Surgeon: [name], MD;  [facility];  Service: Endoscopy;  Laterality: N/A;  830   ESOPHAGOGASTRODUODENOSCOPY N/A 08/03/2016   Procedure: ESOPHAGOGASTRODUODENOSCOPY (EGD);  Surgeon: [name], MD;  [facility];  Service: Endoscopy;  Laterality: N/A;  930   ESOPHAGOGASTRODUODENOSCOPY N/A 05/02/2017   Procedure: ESOPHAGOGASTRODUODENOSCOPY (EGD);  Surgeon: [name], MD;  [facility];  Service: Endoscopy;  Laterality: N/A;  12:00   ESOPHAGOGASTRODUODENOSCOPY N/A 08/17/2017   Procedure: ESOPHAGOGASTRODUODENOSCOPY (EGD);  Surgeon: [name], MD;  [facility];  Service: Endoscopy;  Laterality: N/A;  8:15   ESOPHAGOGASTRODUODENOSCOPY N/A 09/12/2017   Procedure: ESOPHAGOGASTRODUODENOSCOPY (EGD);  Surgeon: [name], MD;  [facility];  Service: Endoscopy;  Laterality: N/A;  1040   ESOPHAGOGASTRODUODENOSCOPY N/A 10/10/2017   Procedure: ESOPHAGOGASTRODUODENOSCOPY (EGD);  Surgeon: [name], MD;  [facility];  Service: Endoscopy;  Laterality: N/A;   225   ESOPHAGOGASTRODUODENOSCOPY N/A 11/09/2017   Procedure: ESOPHAGOGASTRODUODENOSCOPY (EGD);  Surgeon: [name], MD;  [facility];  Service: Endoscopy;  Laterality: N/A;   ESOPHAGOGASTRODUODENOSCOPY (EGD) WITH PROPOFOL  09/24/2012   Procedure: ESOPHAGOGASTRODUODENOSCOPY (EGD) WITH PROPOFOL;  Surgeon: [name], MD;  [facility]: [facility];  Service: Endoscopy;  Laterality: N/A;  GE junction at 36   ESOPHAGOGASTRODUODENOSCOPY (EGD) WITH PROPOFOL N/A 07/06/2017   Procedure: ESOPHAGOGASTRODUODENOSCOPY (EGD) WITH PROPOFOL;  Surgeon: [name], MD;  [facility];  Service: Endoscopy;  Laterality: N/A;   ESOPHAGOGASTRODUODENOSCOPY W/ BANDING  08/2010   GIVENS CAPSULE STUDY N/A 07/05/2017   Procedure: GIVENS CAPSULE STUDY;  Surgeon: [name], MD;  [facility];  Service: Endoscopy;  Laterality: N/A;   HOT HEMOSTASIS N/A 08/17/2017   Procedure: HOT HEMOSTASIS (ARGON PLASMA COAGULATION/BICAP);  Surgeon: [name], MD;  [facility];  Service: Endoscopy;  Laterality: N/A;   HOT HEMOSTASIS  09/12/2017   Procedure: HOT HEMOSTASIS (ARGON PLASMA COAGULATION/BICAP);  Surgeon: [name], MD;  [facility];  Service: Endoscopy;;  gastric   HOT HEMOSTASIS  10/10/2017   Procedure: HOT HEMOSTASIS (ARGON PLASMA COAGULATION/BICAP);  Surgeon: [name], MD;  [facility];  Service: Endoscopy;;   POLYPECTOMY  03/24/2016   Procedure: POLYPECTOMY;  Surgeon: [name], MD;  [facility];  Service: Endoscopy;;  sigmoid polyp   RIGHT HEART CATH N/A 04/16/2017   Procedure: Right Heart Cath;  Surgeon: [name], MD;  Location: [place] [facility];  Service: Cardiovascular;  Laterality: N/A;   RIGHT HEART CATH N/A 07/12/2017   Procedure: RIGHT HEART CATH;  Surgeon: [name], MD;  Location: [place] [facility];  Service: Cardiovascular;  Laterality: N/A;   TONSILLECTOMY      UPPER GASTROINTESTINAL ENDOSCOPY  03/15/2011   EGD ED BANDING/TCS   UPPER GASTROINTESTINAL ENDOSCOPY  09/05/2010   UPPER GASTROINTESTINAL ENDOSCOPY  08/11/2010   VAGINAL HYSTERECTOMY       reports that she has never smoked. She has never used smokeless tobacco. She reports that she does not drink alcohol or use drugs.  Allergies  Allergen Reactions   Ferumoxytol Other (See Comments)    Patient has severe back and chest pain when receiving FERAHEME infusions. Patient has severe back and chest pain when receiving FERAHEME infusions.   Tramadol Hcl Other (See Comments)    WEAKNESS WEAKNESS    Family History  Problem Relation Age of Onset   Lung cancer Mother    Diabetes Father    Diabetes Sister    Hypertension Sister    Hypothyroidism Sister    Colon cancer Brother    Diabetes Sister    Hypothyroidism Brother    Healthy Daughter    Obesity Daughter    Hypertension Daughter     Prior to Admission medications   Medication Sig Start Date End Date Taking? Authorizing Provider  busPIRone (BUSPAR) 5 MG tablet Take 10 mg by mouth 2 (two) times daily.     [provider]  cholecalciferol (VITAMIN D) 1000 UNITS tablet Take 1,000 Units by mouth 3 (three) times a week.     [provider]  HUMALOG 100 UNIT/ML injection Inject 12-20 Units into the skin 3 (three) times daily with meals.  07/01/18   [provider]  Insulin Glargine (LANTUS SOLOSTAR) 100 UNIT/ML Solostar Pen Inject 40 Units into the skin daily at 10 pm. Patient taking differently: Inject 45 Units into the skin every morning.  07/31/18   Barton Dubois, MD  Insulin Pen Needle 31G X 5 MM MISC Use with each insulin injection 07/22/17   [name], [name], MD  lactulose ([place]) 10 GM/15ML solution TAKE 30 MLS BY MOUTH FOUR TIMES DAILY Patient taking differently: Take 20 g by mouth 4 (four) times daily.  01/07/18   Rehman, Mechele Dawley, MD  levothyroxine (SYNTHROID, LEVOTHROID)  25 MCG tablet Take 25 mcg by mouth daily before breakfast.     [provider]  magnesium oxide (MAG-OX) 400 MG tablet Take 400 mg by mouth daily.     [provider]  multivitamin (VIT W/EXTRA C) CHEW chewable tablet Chew 1 tablet by mouth daily.    [provider]  OXYGEN Inhale 2 L into the lungs at bedtime.     [provider]  pantoprazole (PROTONIX) 40 MG tablet Take 1 tablet (40 mg total) by mouth 2 (two) times daily. 10/09/18   Rehman, Mechele Dawley, MD  potassium chloride (K-DUR) 10 MEQ tablet Take 1 tablet (10 mEq total) by mouth 2 (two) times daily. 11/07/18   [name], MD  rifaximin (XIFAXAN) 550 MG TABS tablet Take 1 tablet (550 mg total) by mouth 3 (three) times daily. Patient taking differently: Take 550 mg by mouth 2 (two)  times daily.  07/31/18   Barton Dubois, MD  rosuvastatin (CRESTOR) 5 MG tablet Take 1 tablet (5 mg total) by mouth every other day. Patient not taking: Reported on 01/27/2019 06/27/17 10/09/18  [name], MD  sertraline (ZOLOFT) 50 MG tablet Take 50 mg by mouth daily.    [provider]  spironolactone (ALDACTONE) 50 MG tablet TAKE 1 TABLET BY MOUTH TWICE DAILY 09/16/18   Rehman, Mechele Dawley, MD  torsemide (DEMADEX) 20 MG tablet Take 1 tablet (20 mg total) by mouth daily. Please call for OV [phone] 02/03/19   [name], MD  trimethoprim-polymyxin b Mayra Neer) ophthalmic solution Place 1 drop into the left eye See admin instructions. Use 3 to 4 times daily for 2 days following monthly eye injection 05/06/18   [provider]  zinc sulfate 220 (50 Zn) MG capsule Take 1 capsule (220 mg total) by mouth daily. 08/01/18   Barton Dubois, MD    Physical Exam: Vitals:   02/24/19 1439 02/24/19 1440  BP: (!) 143/45   Pulse: 93   Resp:  16  Temp: 98.2 F (36.8 C)   TempSrc: Oral   SpO2: 100%   Weight: 97.1 kg   Height: 5' 4"  (1.626 m)     Constitutional: NAD, calm, comfortable, pale  appearing Vitals:   02/24/19 1439 02/24/19 1440  BP: (!) 143/45   Pulse: 93   Resp:  16  Temp: 98.2 F (36.8 C)   TempSrc: Oral   SpO2: 100%   Weight: 97.1 kg   Height: 5' 4"  (1.626 m)    Eyes: lids and conjunctivae normal ENMT: Mucous membranes are moist.  Neck: normal, supple Respiratory: clear to auscultation bilaterally. Normal respiratory effort. No accessory muscle use.  Cardiovascular: Regular rate and rhythm, no murmurs. No extremity edema. Abdomen: no tenderness, no distention. Bowel sounds positive.  Musculoskeletal:  No joint deformity upper and lower extremities.   Skin: no rashes, lesions, ulcers.  Psychiatric: Normal judgment and insight. Alert and oriented x 3. Normal mood.   Labs on Admission: I have personally reviewed following labs and imaging studies  CBC: Recent Labs  Lab 02/24/19 1145  WBC 3.3*  HGB 4.8*  HCT 17.6*  MCV 91.2  PLT 65*   Basic Metabolic Panel: Recent Labs  Lab 02/24/19 1145  NA 140  K 4.2  CL 112*  CO2 20*  GLUCOSE 230*  BUN 18  CREATININE 0.93  CALCIUM 8.7*   GFR: Estimated Creatinine Clearance: 70.1 mL/min (by C-G formula based on SCr of 0.93 mg/dL). Liver Function Tests: No results for input(s): AST, ALT, ALKPHOS, BILITOT, PROT, ALBUMIN in the last 168 hours. No results for input(s): LIPASE, AMYLASE in the last 168 hours. Recent Labs  Lab 02/24/19 1145  AMMONIA 57*   Coagulation Profile: No results for input(s): INR, PROTIME in the last 168 hours. Cardiac Enzymes: No results for input(s): CKTOTAL, CKMB, CKMBINDEX, TROPONINI in the last 168 hours. BNP (last 3 results) No results for input(s): PROBNP in the last 8760 hours. HbA1C: No results for input(s): HGBA1C in the last 72 hours. CBG: No results for input(s): GLUCAP in the last 168 hours. Lipid Profile: No results for input(s): CHOL, HDL, LDLCALC, TRIG, CHOLHDL, LDLDIRECT in the last 72 hours. Thyroid Function Tests: No results for input(s): TSH, T4TOTAL,  FREET4, T3FREE, THYROIDAB in the last 72 hours. Anemia Panel: No results for input(s): VITAMINB12, FOLATE, FERRITIN, TIBC, IRON, RETICCTPCT in the last 72 hours. Urine analysis:    Component Value  Date/Time   COLORURINE YELLOW 07/31/2018 0751   APPEARANCEUR CLEAR 07/31/2018 0751   LABSPEC 1.008 07/31/2018 0751   PHURINE 6.0 07/31/2018 0751   GLUCOSEU >=500 (A) 07/31/2018 0751   HGBUR SMALL (A) 07/31/2018 0751   BILIRUBINUR NEGATIVE 07/31/2018 0751   KETONESUR NEGATIVE 07/31/2018 0751   PROTEINUR NEGATIVE 07/31/2018 0751   NITRITE NEGATIVE 07/31/2018 0751   LEUKOCYTESUR NEGATIVE 07/31/2018 0751    Radiological Exams on Admission: No results found.   Assessment/Plan Active Problems:   * No active hospital problems. *    Acute on chronic blood loss anemia due to various GI issues -Plan for PRBC transfusion of 2 units and repeat labs in a.m. -Anticipate discharge in a.m. if stable  GERD -Continue PPI  Hypothyroidism -Continue Synthroid  Diabetes -Continue home medications on carb modified diet  Hypertension -Monitor closely  Depression/anxiety -Continue BuSpar and Zoloft   DVT prophylaxis: SCDs Code Status: Full Family Communication: None at bedside Disposition Plan:Admit for transfusion of 2U PRBCs Consults called:None Admission status: Obs, MedSurg   Ramanda Paules Darleen Crocker DO [facility] Pager [phone]  If 7PM-7AM, please contact night-coverage [email or web address] Password [email or web address]  02/24/2019, 2:51 PM

## 2019-02-24 NOTE — Progress Notes (Signed)
Hgb of 4.8 called to Dr. Laural Golden.  He will contact patient with further instructions.

## 2019-02-24 NOTE — Unmapped (Signed)
Return call placed and spoke with [place] husband. Per spouse, patient in the process of being admitted to [facility] for Hgb of <5. Assured him I would make Dr. Ruffin Frederick aware and follow up later this week

## 2019-02-24 NOTE — Unmapped (Signed)
CONFIDENTIAL PSYCHOLOGICAL FOLLOW-UP EVALUATION FOR TRANSPLANT  *Brief Visit/Treatment Attempt*    Patient Name: Marilyn French  Medical Record Number: [id number]  Date of Service: February 24, 2019  Date of Birth: 1956-02-06; 63 y.o.  Clinical Psychologist: Robert French, Ph.D.  Intern: none  Evaluation Duration and Procedures: 20 minute clinical interview with husband as collateral contact; record review; case consultation    *This patient was not seen in person to minimize potential spread of COVID-19, protect patients/family/providers, and reduce PPE utilization. During this time, transplant psychology will be limiting person-to-person contact when possible.*    I spent 20 minutes on the phone with the patient and her husband. I spent an additional 30 minutes on pre- and post-visit activities.     The patient was physically located in [place] or a state in which I am permitted to provide care. The patient and/or parent/guardian understood that s/he may incur co-pays and cost sharing, and agreed to the telemedicine visit. The visit was completed via phone and/or video, which was appropriate and reasonable under the circumstances given the patient's presentation at the time.    The patient and/or parent/guardian has been advised of the potential risks and limitations of this mode of treatment (including, but not limited to, the absence of in-person examination) and has agreed to be treated using telemedicine. The patient's/patient's family's questions regarding telemedicine have been answered.     If the phone/video visit was completed in an ambulatory setting, the patient and/or parent/guardian has also been advised to contact their provider???s office for worsening conditions, and seek emergency medical treatment and/or call 911 if the patient deems either necessary.    This evaluation note may contain sensitive and confidential information regarding the patient???s psychosocial adjustment to living with a chronic medical condition. DO NOT share this information outside [facility] without written consent from the patient explicitly stating that mental health records may be released.     The limits of confidentiality and the purpose of the evaluation were reviewed. The patient was provided with a verbal description of the nature and purpose of the psychological evaluation. I also reviewed the referral source, specific referral question for this evaluation, foreseeable risks/discomforts, benefits, limits of confidentiality, and mandatory reporting requirements of this provider. The patient was given the opportunity to ask questions and receive answers about the present evaluation. Oral consent was provided by the patient.     BACKGROUND INFORMATION/REASON FOR REFERRAL: Marilyn French was contacted via phone for a telehealth liver pre-transplant follow-up psychological evaluation. She was first evaluated on 11/14/2018 with multiple recommendations, including a 83-month follow-up with psychology. Today, she was encephalopathic and reported she was not feeling well so requested we reschedule. This writer was able to speak with her husband for 20 minutes for a brief visit on pt's status since our last visit. He requested we reschedule when she was able to attend the visit and he also requested we end our visit for collateral information because he needed to take a call from pt's labwork. She is a 63 y.o. married Caucasian female from [place], [place]. She has been diagnosed with ESLD 2/2 NAFLD.    BEHAVIORAL OBSERVATIONS:   [name] answered the call to attend her appointment on time. She was unable to participate due to HE so her husband provided collateral information for this brief visit update. Rapport was easily established. He did not see to present his wife in an overly favorable light.    Collateral Interview:  Pt's husband reported she has  HE for 2-3 weeks. He administers lactulose 40mL 3-4x/day. Lactulose helps get her close to normal. He noted that before he could tell when HE was coming on but now it happens suddenly. She is had to use more oxygen due to SOB.  He has taken over complete medical management over the past month, filling the pill box and giving her medications. He reported that he has ensured to watch her take them as well; she has not refused medications. He has been checking her BS and administering insulin. Pt is unable to use the glucometer, as he stated she just pushes random buttons and cannot use it alone. Pt has an upper endoscopy last week and is reportedly loosing blood each week, getting levels checked weekly , and receiving multiple units of blood.  Prior to her decline in health, he reported she was attending the recommended counseling biweekly from January 15-March 18, about 5 visits total. With increasing HE episodes, she had to cancel and has been unable to benefit from participation. He also noted that his insurance coverage for counseling will run out after 2 more visits. Pt has been meeting with Marilyn Fava, PhD.     PSYCHIATRIC DIAGNOSES:  Deferred                        IMPRESSIONS:  Pt's husband provided brief collateral information updating pt's adherence and functioning. Due to her decline in health and increasing HE, he has taken over complete medical management (medications, BS checks, insulin) for the past month. She has required multiple units of blood due to unknown blood loss. This has been stressful for him but he is hopeful they will figure it out. She maintained therapy until she was unable to participate due to HE. Pt's husband reassured she would restart when able, but he has concerns for insurance coverage. Encouraged him to call his insurance and determine what exactly is available to him.       RECOMMENDATIONS:  Defer until able to evaluate patient. Plan for her to reschedule when able to participate. With regard to prior recommendations, will confirm that she has continued to see her psychologist for therapy, and her husband has taken an active role in her medications and adherence improvement. Once pt is more stable medically, she should be rescheduled for a transplant psychology follow-up. Also encouraged pt's husband to help her re-establish therapy once HE has reduced and she is able to benefit.    Should the patient or treatment team notice a change in functioning, please refer back to transplant psychology for further evaluation and treatment.

## 2019-02-25 DIAGNOSIS — D500 Iron deficiency anemia secondary to blood loss (chronic): Secondary | ICD-10-CM | POA: Diagnosis not present

## 2019-02-25 DIAGNOSIS — D649 Anemia, unspecified: Secondary | ICD-10-CM | POA: Diagnosis not present

## 2019-02-25 LAB — BASIC METABOLIC PANEL
Anion gap: 7 (ref 5–15)
BUN: 21 mg/dL (ref 8–23)
CO2: 18 mmol/L — ABNORMAL LOW (ref 22–32)
Calcium: 8 mg/dL — ABNORMAL LOW (ref 8.9–10.3)
Chloride: 114 mmol/L — ABNORMAL HIGH (ref 98–111)
Creatinine, Ser: 0.95 mg/dL (ref 0.44–1.00)
GFR calc Af Amer: >60 mL/min (ref >60)
GFR calc non Af Amer: >60 mL/min (ref >60)
Glucose, Bld: 168 mg/dL — ABNORMAL HIGH (ref 70–99)
Potassium: 4.1 mmol/L (ref 3.5–5.1)
Sodium: 139 mmol/L (ref 135–145)

## 2019-02-25 LAB — CBC
HCT: 22.3 % — ABNORMAL LOW (ref 36.0–46.0)
Hemoglobin: 6.5 g/dL — CL (ref 12.0–15.0)
MCH: 26.6 pg (ref 26.0–34.0)
MCHC: 29.1 g/dL — ABNORMAL LOW (ref 30.0–36.0)
MCV: 91.4 fL (ref 80.0–100.0)
Platelets: 50 10*3/uL — ABNORMAL LOW (ref 150–400)
RBC: 2.44 MIL/uL — ABNORMAL LOW (ref 3.87–5.11)
RDW: 17.9 % — ABNORMAL HIGH (ref 11.5–15.5)
WBC: 4.7 10*3/uL (ref 4.0–10.5)
nRBC: 0 % (ref 0.0–0.2)

## 2019-02-25 LAB — HEMOGLOBIN AND HEMATOCRIT, BLOOD
HCT: 21.9 % — ABNORMAL LOW (ref 36.0–46.0)
HCT: 25 % — ABNORMAL LOW (ref 36.0–46.0)
Hemoglobin: 6.7 g/dL — CL (ref 12.0–15.0)
Hemoglobin: 7.9 g/dL — ABNORMAL LOW (ref 12.0–15.0)

## 2019-02-25 LAB — GLUCOSE, CAPILLARY
Glucose-Capillary: 116 mg/dL — ABNORMAL HIGH (ref 70–99)
Glucose-Capillary: 207 mg/dL — ABNORMAL HIGH (ref 70–99)

## 2019-02-25 LAB — PREPARE RBC (CROSSMATCH)

## 2019-02-25 LAB — HIV ANTIBODY (ROUTINE TESTING W REFLEX): HIV Screen 4th Generation wRfx: NONREACTIVE

## 2019-02-25 MED ORDER — ACETAMINOPHEN 325 MG PO TABS
650.0000 mg | ORAL_TABLET | Freq: Once | ORAL | Status: AC
  Administered 2019-02-25: 650 mg via ORAL
  Filled 2019-02-25: qty 2

## 2019-02-25 MED ORDER — DIPHENHYDRAMINE HCL 25 MG PO CAPS
25.0000 mg | ORAL_CAPSULE | Freq: Once | ORAL | Status: AC
  Administered 2019-02-25: 25 mg via ORAL
  Filled 2019-02-25: qty 1

## 2019-02-25 MED ORDER — SODIUM CHLORIDE 0.9% IV SOLUTION: Freq: Once | INTRAVENOUS | Status: DC

## 2019-02-25 NOTE — Progress Notes (Signed)
Called lab to see if blood was ready. Lab stated that they were waiting to receive more O negative blood. MD notified that transfusion would be delayed. Will continue to monitor patient.

## 2019-02-25 NOTE — Progress Notes (Signed)
CRITICAL VALUE ALERT  Critical Value:  Hemoglobin 6.5  Date & Time Notied:  02/25/19 0420  Provider Notified: MD notified  Orders Received/Actions taken: Transfuse 1 unit RBCs

## 2019-02-25 NOTE — Progress Notes (Signed)
Nsg Discharge Note  Admit Date:  02/24/2019 Discharge date: 02/25/2019   Jerilee Field to be D/C'd Home  per MD order.  AVS completed. Patient/caregiver able to verbalize understanding.  Discharge Medication: Allergies as of 02/25/2019      Reactions   Ferumoxytol Other (See Comments)   Patient has severe back and chest pain when receiving FERAHEME infusions. Patient has severe back and chest pain when receiving FERAHEME infusions.   Tramadol Hcl Other (See Comments)   WEAKNESS WEAKNESS      Medication List    TAKE these medications   busPIRone 5 MG tablet Commonly known as:  BUSPAR Take 10 mg by mouth 2 (two) times daily.   cholecalciferol 1000 units tablet Commonly known as:  VITAMIN D Take 1,000 Units by mouth 3 (three) times a week.   HumaLOG 100 UNIT/ML injection Generic drug:  insulin lispro Inject 12-20 Units into the skin 3 (three) times daily with meals.   Insulin Glargine 100 UNIT/ML Solostar Pen Commonly known as:  Lantus SoloStar Inject 40 Units into the skin daily at 10 pm. What changed:    how much to take  when to take this   Insulin Pen Needle 31G X 5 MM Misc Use with each insulin injection   lactulose 10 GM/15ML solution Commonly known as:  CHRONULAC TAKE 30 MLS BY MOUTH FOUR TIMES DAILY What changed:  See the new instructions.   levothyroxine 25 MCG tablet Commonly known as:  SYNTHROID Take 25 mcg by mouth daily before breakfast.   magnesium oxide 400 MG tablet Commonly known as:  MAG-OX Take 400 mg by mouth daily.   multivitamin Chew chewable tablet Chew 1 tablet by mouth daily.   OXYGEN Inhale 2 L into the lungs at bedtime.   pantoprazole 40 MG tablet Commonly known as:  PROTONIX Take 1 tablet (40 mg total) by mouth 2 (two) times daily.   potassium chloride 10 MEQ tablet Commonly known as:  K-DUR Take 1 tablet (10 mEq total) by mouth 2 (two) times daily.   rifaximin 550 MG Tabs tablet Commonly known as:  XIFAXAN Take 1 tablet  (550 mg total) by mouth 3 (three) times daily. What changed:  when to take this   rosuvastatin 5 MG tablet Commonly known as:  CRESTOR Take 1 tablet (5 mg total) by mouth every other day.   sertraline 50 MG tablet Commonly known as:  ZOLOFT Take 50 mg by mouth daily.   spironolactone 50 MG tablet Commonly known as:  ALDACTONE TAKE 1 TABLET BY MOUTH TWICE DAILY   torsemide 20 MG tablet Commonly known as:  DEMADEX Take 1 tablet (20 mg total) by mouth daily. Please call for OV [phone]   trimethoprim-polymyxin b ophthalmic solution Commonly known as:  POLYTRIM Place 1 drop into the left eye See admin instructions. Use 3 to 4 times daily for 2 days following monthly eye injection   zinc sulfate 220 (50 Zn) MG capsule Take 1 capsule (220 mg total) by mouth daily.       Discharge Assessment: Vitals:   02/25/19 0844 02/25/19 1149  BP: (!) 129/52 (!) 137/56  Pulse: 84 79  Resp: 18 18  Temp: 98.4 F (36.9 C) 97.7 F (36.5 C)  SpO2: 97% 100%   Skin clean, dry and intact without evidence of skin break down, no evidence of skin tears noted. IV catheter discontinued intact. Site without signs and symptoms of complications - no redness or edema noted at insertion site, patient denies  c/o pain - only slight tenderness at site.  Dressing with slight pressure applied.  D/c Instructions-Education: Discharge instructions given to patient with verbalized understanding. D/c education completed with patient including follow up instructions, medication list, d/c activities limitations if indicated, with other d/c instructions as indicated by MD - patient able to verbalize understanding, all questions fully answered. Patient instructed to return to ED, call 911, or call MD for any changes in condition.  Patient escorted via [place], and D/C home via private auto.  Evelette Hollern Loletha Grayer, RN 02/25/2019 3:18 PM

## 2019-02-25 NOTE — Discharge Summary (Signed)
Physician Discharge Summary  [name] UTM:[id number] DOB: 03-31-1956 DOA: 02/24/2019  PCP: Glenda Chroman, MD  Admit date: 02/24/2019  Discharge date: 02/25/2019  Admitted From:Home  Disposition:  Home  Recommendations for Outpatient Follow-up:  1. Follow up with PCP in 1-2 weeks 2. Please obtain repeat CBC in 1 week 3. Follow-up with Dr.Rehman as previously scheduled  Home Health: None  Equipment/Devices: None  Discharge Condition: Stable  CODE STATUS: Full  Diet recommendation: Heart Healthy  Brief/Interim Summary: Per HPI: Maria [name] is a 63 y.o. female with medical history significant for diabetes, hypertension, hypothyroidism, depression, CHF, GAVE, and cirrhosis secondary to NASH complicated by esophageal varices and hepatic encephalopathy who is followed at [facility] liver clinic as well as locally with Dr. Laural Golden.  She also has a history of chronic GI blood loss from her various GI issues and had asked for repeat hemoglobin and hematocrit level today and was noted to have a hemoglobin level of 4.8 from a prior hemoglobin of 6.4 on 02/13/2019.  She appears to have severe anemia chronically runs with a hemoglobin around 7.  She has been directly admitted per recommendations by Dr. Laural Golden for PRBC transfusion.  She denies any new changes to her abdominal pain and continues to have some dark stools which is fairly normal for her.  Vital signs are currently stable.  Patient was directly admitted admitted with acute on chronic blood loss anemia due to various GI issues as noted above.  She was transfused a total of 3 units of PRBCs and hemoglobin is currently 7.9.  Vital signs have remained stable with no other acute events during the course of this brief hospitalization.  She is now ready for discharge.  She will follow-up with PCP in the next 1 to 2 weeks and will require follow-up CBC and further reevaluation per local GI as well as GI at [facility].  Discharge Diagnoses:   Active Problems:   Acute on chronic anemia  Principal discharge diagnosis: Acute on chronic blood loss anemia status post PRBC transfusion.  Discharge Instructions  Discharge Instructions    Diet - low sodium heart healthy   Complete by:  As directed    Increase activity slowly   Complete by:  As directed      Allergies as of 02/25/2019      Reactions   Ferumoxytol Other (See Comments)   Patient has severe back and chest pain when receiving FERAHEME infusions. Patient has severe back and chest pain when receiving FERAHEME infusions.   Tramadol Hcl Other (See Comments)   WEAKNESS WEAKNESS      Medication List    TAKE these medications   busPIRone 5 MG tablet Commonly known as:  BUSPAR Take 10 mg by mouth 2 (two) times daily.   cholecalciferol 1000 units tablet Commonly known as:  VITAMIN D Take 1,000 Units by mouth 3 (three) times a week.   HumaLOG 100 UNIT/ML injection Generic drug:  insulin lispro Inject 12-20 Units into the skin 3 (three) times daily with meals.   Insulin Glargine 100 UNIT/ML Solostar Pen Commonly known as:  Lantus SoloStar Inject 40 Units into the skin daily at 10 pm. What changed:    how much to take  when to take this   Insulin Pen Needle 31G X 5 MM Misc Use with each insulin injection   lactulose 10 GM/15ML solution Commonly known as:  CHRONULAC TAKE 30 MLS BY MOUTH FOUR TIMES DAILY What changed:  See the  new instructions.   levothyroxine 25 MCG tablet Commonly known as:  SYNTHROID Take 25 mcg by mouth daily before breakfast.   magnesium oxide 400 MG tablet Commonly known as:  MAG-OX Take 400 mg by mouth daily.   multivitamin Chew chewable tablet Chew 1 tablet by mouth daily.   OXYGEN Inhale 2 L into the lungs at bedtime.   pantoprazole 40 MG tablet Commonly known as:  PROTONIX Take 1 tablet (40 mg total) by mouth 2 (two) times daily.   potassium chloride 10 MEQ tablet Commonly known as:  K-DUR Take 1 tablet (10 mEq  total) by mouth 2 (two) times daily.   rifaximin 550 MG Tabs tablet Commonly known as:  XIFAXAN Take 1 tablet (550 mg total) by mouth 3 (three) times daily. What changed:  when to take this   rosuvastatin 5 MG tablet Commonly known as:  CRESTOR Take 1 tablet (5 mg total) by mouth every other day.   sertraline 50 MG tablet Commonly known as:  ZOLOFT Take 50 mg by mouth daily.   spironolactone 50 MG tablet Commonly known as:  ALDACTONE TAKE 1 TABLET BY MOUTH TWICE DAILY   torsemide 20 MG tablet Commonly known as:  DEMADEX Take 1 tablet (20 mg total) by mouth daily. Please call for OV [phone]   trimethoprim-polymyxin b ophthalmic solution Commonly known as:  POLYTRIM Place 1 drop into the left eye See admin instructions. Use 3 to 4 times daily for 2 days following monthly eye injection   zinc sulfate 220 (50 Zn) MG capsule Take 1 capsule (220 mg total) by mouth daily.      Follow-up Information    [name], Dhruv B, MD Follow up in 1 week(s).   Specialty:  Internal Medicine Contact information: [place] [address] [phone]        [name], MD Follow up in 1 week(s).   Specialty:  Gastroenterology Contact information: [address], [address] [place] [address] [phone]          Allergies  Allergen Reactions  . Ferumoxytol Other (See Comments)    Patient has severe back and chest pain when receiving FERAHEME infusions. Patient has severe back and chest pain when receiving FERAHEME infusions.  . Tramadol Hcl Other (See Comments)    WEAKNESS WEAKNESS    Consultations:  None   Procedures/Studies: US Venous Img Upper Uni Left  Result Date: 01/27/2019 CLINICAL DATA:  Left upper extremity edema for the past 2-3 weeks. History of diabetes. Evaluate DVT EXAM: LEFT UPPER EXTREMITY VENOUS DOPPLER ULTRASOUND TECHNIQUE: Gray-scale sonography with graded compression, as well as color Doppler and duplex ultrasound were performed to  evaluate the upper extremity deep venous system from the level of the subclavian vein and including the jugular, axillary, basilic, radial, ulnar and upper cephalic vein. Spectral Doppler was utilized to evaluate flow at rest and with distal augmentation maneuvers. COMPARISON:  None. FINDINGS: Contralateral Subclavian Vein: Respiratory phasicity is normal and symmetric with the symptomatic side. No evidence of thrombus. Normal compressibility. Internal Jugular Vein: No evidence of thrombus. Normal compressibility, respiratory phasicity and response to augmentation. Subclavian Vein: No evidence of thrombus. Normal compressibility, respiratory phasicity and response to augmentation. Axillary Vein: No evidence of thrombus. Normal compressibility, respiratory phasicity and response to augmentation. Cephalic Vein: No evidence of thrombus. Normal compressibility, respiratory phasicity and response to augmentation. Basilic Vein: No evidence of thrombus. Normal compressibility, respiratory phasicity and response to augmentation. Brachial Veins: No evidence of thrombus.  Normal compressibility, respiratory phasicity and response to augmentation. Radial Veins: No evidence of thrombus. Normal compressibility, respiratory phasicity and response to augmentation. Ulnar Veins: No evidence of thrombus. Normal compressibility, respiratory phasicity and response to augmentation. Venous Reflux:  None visualized. Other Findings: There is a minimal amount of subcutaneous edema at the level the left forearm (images 45 and 46). IMPRESSION: No evidence of DVT within the left upper extremity. Electronically Signed   By: Sandi Mariscal M.D.   On: 01/27/2019 16:24     Discharge Exam: Vitals:   02/25/19 0844 02/25/19 1149  BP: (!) 129/52 (!) 137/56  Pulse: 84 79  Resp: 18 18  Temp: 98.4 F (36.9 C) 97.7 F (36.5 C)  SpO2: 97% 100%   Vitals:   02/25/19 0553 02/25/19 0825 02/25/19 0844 02/25/19 1149  BP: (!) 128/57 (!) 129/48 (!)  129/52 (!) 137/56  Pulse: 81 84 84 79  Resp: 18 18 18 18   Temp: 98.3 F (36.8 C) 98.9 F (37.2 C) 98.4 F (36.9 C) 97.7 F (36.5 C)  TempSrc: Oral Oral Oral Oral  SpO2: 100% 97% 97% 100%  Weight:      Height:        General: Pt is alert, awake, not in acute distress Cardiovascular: RRR, S1/S2 +, no rubs, no gallops Respiratory: CTA bilaterally, no wheezing, no rhonchi Abdominal: Soft, NT, ND, bowel sounds + Extremities: no edema, no cyanosis    The results of significant diagnostics from this hospitalization (including imaging, microbiology, ancillary and laboratory) are listed below for reference.     Microbiology: Recent Results (from the past 240 hour(s))  SARS Coronavirus 2 (CEPHEID - Performed in [place] hospital lab), Hosp Order     Status: None   Collection Time: 02/24/19  5:59 PM  Result Value Ref Range Status   SARS Coronavirus 2 NEGATIVE NEGATIVE Final    Comment: (NOTE) If result is NEGATIVE SARS-CoV-2 target nucleic acids are NOT DETECTED. The SARS-CoV-2 RNA is generally detectable in upper and lower  respiratory specimens during the acute phase of infection. The lowest  concentration of SARS-CoV-2 viral copies this assay can detect is 250  copies / mL. A negative result does not preclude SARS-CoV-2 infection  and should not be used as the sole basis for treatment or other  patient management decisions.  A negative result may occur with  improper specimen collection / handling, submission of specimen other  than nasopharyngeal swab, presence of viral mutation(s) within the  areas targeted by this assay, and inadequate number of viral copies  (<250 copies / mL). A negative result must be combined with clinical  observations, patient history, and epidemiological information. If result is POSITIVE SARS-CoV-2 target nucleic acids are DETECTED. The SARS-CoV-2 RNA is generally detectable in upper and lower  respiratory specimens dur ing the acute phase of  infection.  Positive  results are indicative of active infection with SARS-CoV-2.  Clinical  correlation with patient history and other diagnostic information is  necessary to determine patient infection status.  Positive results do  not rule out bacterial infection or co-infection with other viruses. If result is PRESUMPTIVE POSTIVE SARS-CoV-2 nucleic acids MAY BE PRESENT.   A presumptive positive result was obtained on the submitted specimen  and confirmed on repeat testing.  While 2019 novel coronavirus  (SARS-CoV-2) nucleic acids may be present in the submitted sample  additional confirmatory testing may be necessary for epidemiological  and / or clinical management purposes  to differentiate between  SARS-CoV-2  and other Sarbecovirus currently known to infect humans.  If clinically indicated additional testing with an alternate test  methodology [phone]) is advised. The SARS-CoV-2 RNA is generally  detectable in upper and lower respiratory sp ecimens during the acute  phase of infection. The expected result is Negative. Fact Sheet for Patients:  StrictlyIdeas.no Fact Sheet for Healthcare Providers: BankingDealers.co.za This test is not yet approved or cleared by the [place] FDA and has been authorized for detection and/or diagnosis of SARS-CoV-2 by FDA under an Emergency Use Authorization (EUA).  This EUA will remain in effect (meaning this test can be used) for the duration of the COVID-19 declaration under Section 564(b)(1) of the Act, 21 U.S.C. section 360bbb-3(b)(1), unless the authorization is terminated or revoked sooner. Performed at [facility], [address], [place], [place] [address]      Labs: BNP (last 3 results) Recent Labs    07/02/18 1736  BNP 62.0   Basic Metabolic Panel: Recent Labs  Lab 02/24/19 1145 02/25/19 0339  NA 140 139  K 4.2 4.1  CL 112* 114*  CO2 20* 18*  GLUCOSE 230* 168*  BUN 18  21  CREATININE 0.93 0.95  CALCIUM 8.7* 8.0*   Liver Function Tests: No results for input(s): AST, ALT, ALKPHOS, BILITOT, PROT, ALBUMIN in the last 168 hours. No results for input(s): LIPASE, AMYLASE in the last 168 hours. Recent Labs  Lab 02/24/19 1145  AMMONIA 57*   CBC: Recent Labs  Lab 02/24/19 1145 02/25/19 0339 02/25/19 0808 02/25/19 1329  WBC 3.3* 4.7  --   --   HGB 4.8* 6.5* 6.7* 7.9*  HCT 17.6* 22.3* 21.9* 25.0*  MCV 91.2 91.4  --   --   PLT 65* 50*  --   --    Cardiac Enzymes: No results for input(s): CKTOTAL, CKMB, CKMBINDEX, TROPONINI in the last 168 hours. BNP: Invalid input(s): POCBNP CBG: Recent Labs  Lab 02/24/19 1607 02/24/19 2112 02/25/19 0727 02/25/19 1112  GLUCAP 193* 74 116* 207*   D-Dimer No results for input(s): DDIMER in the last 72 hours. Hgb A1c No results for input(s): HGBA1C in the last 72 hours. Lipid Profile No results for input(s): CHOL, HDL, LDLCALC, TRIG, CHOLHDL, LDLDIRECT in the last 72 hours. Thyroid function studies No results for input(s): TSH, T4TOTAL, T3FREE, THYROIDAB in the last 72 hours.  Invalid input(s): FREET3 Anemia work up No results for input(s): VITAMINB12, FOLATE, FERRITIN, TIBC, IRON, RETICCTPCT in the last 72 hours. Urinalysis    Component Value Date/Time   COLORURINE YELLOW 07/31/2018 0751   APPEARANCEUR CLEAR 07/31/2018 0751   LABSPEC 1.008 07/31/2018 0751   PHURINE 6.0 07/31/2018 0751   GLUCOSEU >=500 (A) 07/31/2018 0751   HGBUR SMALL (A) 07/31/2018 0751   BILIRUBINUR NEGATIVE 07/31/2018 0751   KETONESUR NEGATIVE 07/31/2018 0751   PROTEINUR NEGATIVE 07/31/2018 0751   NITRITE NEGATIVE 07/31/2018 0751   LEUKOCYTESUR NEGATIVE 07/31/2018 0751   Sepsis Labs Invalid input(s): PROCALCITONIN,  WBC,  LACTICIDVEN Microbiology Recent Results (from the past 240 hour(s))  SARS Coronavirus 2 (CEPHEID - Performed in [place] hospital lab), Hosp Order     Status: None   Collection Time: 02/24/19  5:59 PM   Result Value Ref Range Status   SARS Coronavirus 2 NEGATIVE NEGATIVE Final    Comment: (NOTE) If result is NEGATIVE SARS-CoV-2 target nucleic acids are NOT DETECTED. The SARS-CoV-2 RNA is generally detectable in upper and lower  respiratory specimens during the acute phase of infection. The lowest  concentration  of SARS-CoV-2 viral copies this assay can detect is 250  copies / mL. A negative result does not preclude SARS-CoV-2 infection  and should not be used as the sole basis for treatment or other  patient management decisions.  A negative result may occur with  improper specimen collection / handling, submission of specimen other  than nasopharyngeal swab, presence of viral mutation(s) within the  areas targeted by this assay, and inadequate number of viral copies  (<250 copies / mL). A negative result must be combined with clinical  observations, patient history, and epidemiological information. If result is POSITIVE SARS-CoV-2 target nucleic acids are DETECTED. The SARS-CoV-2 RNA is generally detectable in upper and lower  respiratory specimens dur ing the acute phase of infection.  Positive  results are indicative of active infection with SARS-CoV-2.  Clinical  correlation with patient history and other diagnostic information is  necessary to determine patient infection status.  Positive results do  not rule out bacterial infection or co-infection with other viruses. If result is PRESUMPTIVE POSTIVE SARS-CoV-2 nucleic acids MAY BE PRESENT.   A presumptive positive result was obtained on the submitted specimen  and confirmed on repeat testing.  While 2019 novel coronavirus  (SARS-CoV-2) nucleic acids may be present in the submitted sample  additional confirmatory testing may be necessary for epidemiological  and / or clinical management purposes  to differentiate between  SARS-CoV-2 and other Sarbecovirus currently known to infect humans.  If clinically indicated additional  testing with an alternate test  methodology [phone]) is advised. The SARS-CoV-2 RNA is generally  detectable in upper and lower respiratory sp ecimens during the acute  phase of infection. The expected result is Negative. Fact Sheet for Patients:  StrictlyIdeas.no Fact Sheet for Healthcare Providers: BankingDealers.co.za This test is not yet approved or cleared by the [place] FDA and has been authorized for detection and/or diagnosis of SARS-CoV-2 by FDA under an Emergency Use Authorization (EUA).  This EUA will remain in effect (meaning this test can be used) for the duration of the COVID-19 declaration under Section 564(b)(1) of the Act, 21 U.S.C. section 360bbb-3(b)(1), unless the authorization is terminated or revoked sooner. Performed at [facility], [address], [place], [place] [address]      Time coordinating discharge: 35 minutes  SIGNED:   Rodena Goldmann, DO [facility] 02/25/2019, 2:56 PM  If 7PM-7AM, please contact night-coverage [email or web address] Password [email or web address]

## 2019-02-25 NOTE — Unmapped (Signed)
Transplant Psychology Contact Note     Called pt's psychologist, Creola Corn, PhD [phone]) for an update on pt engagement in [facility] therapy per transplant recommendation. She stated that pt no-showed to their last appointment on 12/18/18. She called pt to ask to reschedule for a telehealth visit in light of COVID-19 restrictions; pt said she was not feeling well and did not reschedule at that time. Dr. Alvan Dame asked for her to call back to schedule their next appointment and pt never did.     Marilyn French started therapy on 10/02/18 and her last session was 12/04/18; seen biweekly so approximately 5-6 sessions total. Dr. Alvan Dame acknowledged that pt's HE did affect her participation sometimes, but she was generally engaged and felt she was starting to benefit from treatment. In response to this writer attempting to reach out to Dr. Alvan Dame, she (Dr. Alvan Dame) called pt this morning and pt was not interested in resuming therapy due to cognitive (HE) issues.    Dr. Alvan Dame is willing to resume services with pt if and when she is ready.    Robert Bellow, Ph.D.  Clinical Psychologist

## 2019-02-26 ENCOUNTER — Emergency Department (HOSPITAL_COMMUNITY): Payer: PPO

## 2019-02-26 ENCOUNTER — Encounter (HOSPITAL_COMMUNITY): Payer: Self-pay

## 2019-02-26 ENCOUNTER — Other Ambulatory Visit: Payer: Self-pay

## 2019-02-26 ENCOUNTER — Observation Stay (HOSPITAL_COMMUNITY)
Admission: EM | Admit: 2019-02-26 | Discharge: 2019-02-27 | Disposition: A | Discharge status: Home / Self Care | Payer: PPO | Attending: Internal Medicine | Admitting: Internal Medicine

## 2019-02-26 ENCOUNTER — Telehealth: Payer: Self-pay | Admitting: Emergency Medicine

## 2019-02-26 ENCOUNTER — Ambulatory Visit: Admit: 2019-02-26 | Discharge: 2019-02-27 | Payer: PRIVATE HEALTH INSURANCE

## 2019-02-26 DIAGNOSIS — K7290 Hepatic failure, unspecified without coma: Principal | ICD-10-CM

## 2019-02-26 DIAGNOSIS — Z01818 Encounter for other preprocedural examination: Principal | ICD-10-CM

## 2019-02-26 DIAGNOSIS — Z9049 Acquired absence of other specified parts of digestive tract: Secondary | ICD-10-CM | POA: Insufficient documentation

## 2019-02-26 DIAGNOSIS — E119 Type 2 diabetes mellitus without complications: Secondary | ICD-10-CM | POA: Diagnosis not present

## 2019-02-26 DIAGNOSIS — Z79899 Other long term (current) drug therapy: Secondary | ICD-10-CM | POA: Insufficient documentation

## 2019-02-26 DIAGNOSIS — R8271 Bacteriuria: Secondary | ICD-10-CM | POA: Diagnosis present

## 2019-02-26 DIAGNOSIS — G9340 Encephalopathy, unspecified: Secondary | ICD-10-CM | POA: Diagnosis not present

## 2019-02-26 DIAGNOSIS — I2510 Atherosclerotic heart disease of native coronary artery without angina pectoris: Secondary | ICD-10-CM | POA: Insufficient documentation

## 2019-02-26 DIAGNOSIS — D649 Anemia, unspecified: Secondary | ICD-10-CM | POA: Diagnosis present

## 2019-02-26 DIAGNOSIS — N3000 Acute cystitis without hematuria: Secondary | ICD-10-CM | POA: Insufficient documentation

## 2019-02-26 DIAGNOSIS — K7200 Acute and subacute hepatic failure without coma: Principal | ICD-10-CM | POA: Insufficient documentation

## 2019-02-26 DIAGNOSIS — Z03818 Encounter for observation for suspected exposure to other biological agents ruled out: Secondary | ICD-10-CM | POA: Diagnosis not present

## 2019-02-26 DIAGNOSIS — Z794 Long term (current) use of insulin: Secondary | ICD-10-CM | POA: Insufficient documentation

## 2019-02-26 DIAGNOSIS — R4182 Altered mental status, unspecified: Secondary | ICD-10-CM | POA: Diagnosis present

## 2019-02-26 DIAGNOSIS — I5032 Chronic diastolic (congestive) heart failure: Secondary | ICD-10-CM | POA: Diagnosis not present

## 2019-02-26 DIAGNOSIS — I110 Hypertensive heart disease with heart failure: Secondary | ICD-10-CM | POA: Diagnosis not present

## 2019-02-26 DIAGNOSIS — D696 Thrombocytopenia, unspecified: Secondary | ICD-10-CM | POA: Diagnosis present

## 2019-02-26 DIAGNOSIS — K7682 Hepatic encephalopathy: Secondary | ICD-10-CM | POA: Diagnosis present

## 2019-02-26 DIAGNOSIS — E039 Hypothyroidism, unspecified: Secondary | ICD-10-CM | POA: Diagnosis not present

## 2019-02-26 DIAGNOSIS — K7460 Unspecified cirrhosis of liver: Secondary | ICD-10-CM | POA: Diagnosis present

## 2019-02-26 DIAGNOSIS — Z1159 Encounter for screening for other viral diseases: Secondary | ICD-10-CM | POA: Insufficient documentation

## 2019-02-26 LAB — TYPE AND SCREEN
ABO/RH(D): O NEG
Antibody Screen: NEGATIVE
Unit division: 0
Unit division: 0
Unit division: 0

## 2019-02-26 LAB — BPAM RBC
Blood Product Expiration Date: 202007032359
Blood Product Expiration Date: 202007032359
Blood Product Expiration Date: 202007062359
ISSUE DATE / TIME: 202006081759
ISSUE DATE / TIME: 202006082153
ISSUE DATE / TIME: 202006090818
Unit Type and Rh: 9500
Unit Type and Rh: 9500
Unit Type and Rh: 9500

## 2019-02-26 LAB — PROTIME-INR
INR: 1.6 — ABNORMAL HIGH (ref 0.8–1.2)
Prothrombin Time: 19.3 seconds — ABNORMAL HIGH (ref 11.4–15.2)

## 2019-02-26 NOTE — ED Triage Notes (Signed)
Pt brought to ED by husband for altered mental status. Pt was discharged from hospital on 6/8. Pt with end stage renal disease and awaiting transplant. Pt husband noticed AMS 2 days ago but states this morning she fell off the bed. Husband states she has stopped following commands this afternoon 1800.

## 2019-02-26 NOTE — ED Provider Notes (Signed)
[facility] EMERGENCY DEPARTMENT Provider Note   CSN: [id number] Arrival date & time: 02/26/19  2143    History   Chief Complaint Chief Complaint  Patient presents with  . Altered Mental Status    HPI Maria [name] is a 63 y.o. female.     HPI  This is a 63 year old female with a history of cirrhosis secondary to NASH on the liver transplant list at [facility], chronic anemia with recent transfusion of 3 units 2 days ago, esophageal varices, diabetes, hypertension, hypothyroidism who presents with altered mental status.  History is taken from the patient's husband.  She was discharged from the hospital on Tuesday afternoon after receiving 3 units of blood for acute on chronic anemia.  She is due to have an esophageal study on Friday.  Husband reports that her mental status has progressively declined since discharge.  He found her this morning on the floor as it appears she rolled out of bed.  He called EMS to help get her up.  She did not have any obvious trauma and was able to ambulate with a walker at that time.  However, since that time she has gone from following commands and ambulating to not following commands and becoming less responsive.  She has not complained of any pain.  Husband states that she has not taken her lactulose today and he is unsure whether she received lactulose while in the hospital.  Level 5 caveat for altered mental status  Past Medical History:  Diagnosis Date  . Anemia   . CHF (congestive heart failure) ([place])   . Cirrhosis ([place])   . Depression   . Diabetes mellitus   . Esophageal bleed, non-variceal   . Eye hemorrhage    Behind left eye  . Fibromyalgia   . Hypercholesteremia   . Hypertension   . Hypothyroidism   . NAFLD (nonalcoholic fatty liver disease)   . Osteoarthrosis   . Osteoporosis   . PONV (postoperative nausea and vomiting)   . UTI (urinary tract infection) 11/12 end of the month   Patient feels that she passed a kidney stone at that  time    Patient Active Problem List   Diagnosis Date Noted  . Acute on chronic anemia 02/24/2019  . Acute hepatic encephalopathy 07/26/2018  . Obesity, Class III, BMI 40-49.9 (morbid obesity) ([place])   . Melena   . Anemia 07/02/2018  . UGI bleed 11/09/2017  . Insulin dependent diabetes mellitus ([place])   . Absolute anemia 09/13/2017  . Idiopathic esophageal varices with bleeding ([place]) 09/13/2017  . Other cirrhosis of liver ([place]) 08/22/2017  . NAFLD (nonalcoholic fatty liver disease) 08/14/2017  . Encephalopathy, hepatic ([place]) 08/14/2017  . Metabolic encephalopathy 95/28/4132  . Hyperammonemia ([place]) 07/18/2017  . Hypothyroidism 07/18/2017  . Acute on chronic right-sided congestive heart failure ([place]) 07/06/2017  . Pulmonary hypertension ([place]) 07/06/2017  . GAVE (gastric antral vascular ectasia) 07/06/2017  . GI bleeding 07/06/2017  . Symptomatic anemia 07/04/2017  . Hypokalemia 07/04/2017  . Chronic diastolic heart failure ([place]) 06/11/2017  . CAD (coronary artery disease) 06/11/2017  . Nocturnal hypoxemia 02/22/2017  . Idiopathic esophageal varices without bleeding ([place]) 01/31/2017  . Dyspnea and respiratory abnormalities 01/25/2017  . Esophageal varices in cirrhosis ([place]) 05/19/2016  . Hepatic cirrhosis ([place]) 05/19/2016  . Esophageal varices ([place]) 12/09/2012  . Dysphagia, unspecified(787.20) 12/09/2012  . Anxiety 08/26/2012  . Cirrhosis, nonalcoholic ([place]) 44/09/270  . Diabetes mellitus ([place]) 10/09/2011  . History of esophageal varices 10/09/2011  . Iron  deficiency anemia 10/09/2011  . GERD (gastroesophageal reflux disease) 10/09/2011  . Thrombocytopenia ([place]) 10/09/2011    Past Surgical History:  Procedure Laterality Date  . APPENDECTOMY  1980  . BILATERAL SALPINGOOPHORECTOMY    . CHOLECYSTECTOMY    . COLONOSCOPY  03/15/2011  . COLONOSCOPY N/A 03/24/2016   Procedure: COLONOSCOPY;  Surgeon: [name], MD;  [facility];  Service: Endoscopy;  Laterality:  N/A;  855  . ESOPHAGEAL BANDING  04/24/2012   Procedure: ESOPHAGEAL BANDING;  Surgeon: [name], MD;  Location: [facility];  Service: Endoscopy;  Laterality: N/A;  . Esophageal BANDING  08/03/2012   [facility] in [place]  09/24/2012   Procedure: ESOPHAGEAL BANDING;  Surgeon: [name], MD;  Location: [facility] ORS;  Service: Endoscopy;  Laterality: N/A;  Banding x 3  . ESOPHAGEAL BANDING N/A 01/29/2013   Procedure: ESOPHAGEAL BANDING;  Surgeon: [name], MD;  [facility];  Service: Endoscopy;  Laterality: N/A;  . ESOPHAGEAL BANDING N/A 06/19/2014   Procedure: ESOPHAGEAL BANDING;  Surgeon: [name], MD;  [facility];  Service: Endoscopy;  Laterality: N/A;  . ESOPHAGEAL BANDING N/A 01/05/2016   Procedure: ESOPHAGEAL BANDING;  Surgeon: [name], MD;  [facility];  Service: Endoscopy;  Laterality: N/A;  . ESOPHAGEAL BANDING N/A 08/03/2016   Procedure: ESOPHAGEAL BANDING;  Surgeon: [name], MD;  [facility];  Service: Endoscopy;  Laterality: N/A;  . ESOPHAGEAL BANDING N/A 05/02/2017   Procedure: ESOPHAGEAL BANDING;  Surgeon: [name], MD;  [facility];  Service: Endoscopy;  Laterality: N/A;  . ESOPHAGOGASTRODUODENOSCOPY  04/24/2012   Procedure: ESOPHAGOGASTRODUODENOSCOPY (EGD);  Surgeon: [name], MD;  [facility];  Service: Endoscopy;  Laterality: N/A;  300  . ESOPHAGOGASTRODUODENOSCOPY N/A 01/29/2013   Procedure: ESOPHAGOGASTRODUODENOSCOPY (EGD);  Surgeon: [name], MD;  [facility];  Service: Endoscopy;  Laterality: N/A;  1200  . ESOPHAGOGASTRODUODENOSCOPY N/A 05/22/2013   Procedure: ESOPHAGOGASTRODUODENOSCOPY (EGD);  Surgeon: [name], MD;  [facility];  Service: Endoscopy;  Laterality: N/A;  1:55  . ESOPHAGOGASTRODUODENOSCOPY N/A 12/31/2013   Procedure: ESOPHAGOGASTRODUODENOSCOPY (EGD);  Surgeon: [name], MD;  [facility];  Service: Endoscopy;  Laterality: N/A;  1200  . ESOPHAGOGASTRODUODENOSCOPY N/A 06/19/2014   Procedure: ESOPHAGOGASTRODUODENOSCOPY (EGD);  Surgeon: [name], MD;  [facility];  Service: Endoscopy;  Laterality: N/A;  1055  . ESOPHAGOGASTRODUODENOSCOPY N/A 01/15/2015   Procedure: ESOPHAGOGASTRODUODENOSCOPY (EGD);  Surgeon: [name], MD;  [facility];  Service: Endoscopy;  Laterality: N/A;  730 - moved to 9:45 - moved to 1250-Ann notified pt  . ESOPHAGOGASTRODUODENOSCOPY N/A 06/30/2015   Procedure: ESOPHAGOGASTRODUODENOSCOPY (EGD);  Surgeon: [name], MD;  [facility];  Service: Endoscopy;  Laterality: N/A;  1200  . ESOPHAGOGASTRODUODENOSCOPY N/A 01/05/2016   Procedure: ESOPHAGOGASTRODUODENOSCOPY (EGD);  Surgeon: [name], MD;  [facility];  Service: Endoscopy;  Laterality: N/A;  830  . ESOPHAGOGASTRODUODENOSCOPY N/A 08/03/2016   Procedure: ESOPHAGOGASTRODUODENOSCOPY (EGD);  Surgeon: [name], MD;  [facility];  Service: Endoscopy;  Laterality: N/A;  930  . ESOPHAGOGASTRODUODENOSCOPY N/A 05/02/2017   Procedure: ESOPHAGOGASTRODUODENOSCOPY (EGD);  Surgeon: [name], MD;  [facility];  Service: Endoscopy;  Laterality: N/A;  12:00  . ESOPHAGOGASTRODUODENOSCOPY N/A 08/17/2017   Procedure: ESOPHAGOGASTRODUODENOSCOPY (  EGD);  Surgeon: [name], MD;  [facility];  Service: Endoscopy;  Laterality: N/A;  8:15  . ESOPHAGOGASTRODUODENOSCOPY N/A 09/12/2017   Procedure: ESOPHAGOGASTRODUODENOSCOPY (EGD);  Surgeon: [name], MD;  [facility];  Service: Endoscopy;  Laterality: N/A;  1040  . ESOPHAGOGASTRODUODENOSCOPY N/A 10/10/2017   Procedure: ESOPHAGOGASTRODUODENOSCOPY (EGD);  Surgeon: [name], MD;  [facility];  Service: Endoscopy;  Laterality: N/A;  225  . ESOPHAGOGASTRODUODENOSCOPY N/A 11/09/2017    Procedure: ESOPHAGOGASTRODUODENOSCOPY (EGD);  Surgeon: [name], MD;  [facility];  Service: Endoscopy;  Laterality: N/A;  . ESOPHAGOGASTRODUODENOSCOPY (EGD) WITH PROPOFOL  09/24/2012   Procedure: ESOPHAGOGASTRODUODENOSCOPY (EGD) WITH PROPOFOL;  Surgeon: [name], MD;  Location: [facility];  Service: Endoscopy;  Laterality: N/A;  GE junction at 36  . ESOPHAGOGASTRODUODENOSCOPY (EGD) WITH PROPOFOL N/A 07/06/2017   Procedure: ESOPHAGOGASTRODUODENOSCOPY (EGD) WITH PROPOFOL;  Surgeon: [name], MD;  [facility];  Service: Endoscopy;  Laterality: N/A;  . ESOPHAGOGASTRODUODENOSCOPY W/ BANDING  08/2010  . GIVENS CAPSULE STUDY N/A 07/05/2017   Procedure: GIVENS CAPSULE STUDY;  Surgeon: [name], MD;  [facility];  Service: Endoscopy;  Laterality: N/A;  . HOT HEMOSTASIS N/A 08/17/2017   Procedure: HOT HEMOSTASIS (ARGON PLASMA COAGULATION/BICAP);  Surgeon: [name], MD;  [facility];  Service: Endoscopy;  Laterality: N/A;  . HOT HEMOSTASIS  09/12/2017   Procedure: HOT HEMOSTASIS (ARGON PLASMA COAGULATION/BICAP);  Surgeon: [name], MD;  [facility];  Service: Endoscopy;;  gastric  . HOT HEMOSTASIS  10/10/2017   Procedure: HOT HEMOSTASIS (ARGON PLASMA COAGULATION/BICAP);  Surgeon: [name], MD;  [facility];  Service: Endoscopy;;  . POLYPECTOMY  03/24/2016   Procedure: POLYPECTOMY;  Surgeon: [name], MD;  [facility];  Service: Endoscopy;;  sigmoid polyp  . RIGHT HEART CATH N/A 04/16/2017   Procedure: Right Heart Cath;  Surgeon: [name], MD;  Location: [place] [facility];  Service: Cardiovascular;  Laterality: N/A;  . RIGHT HEART CATH N/A 07/12/2017   Procedure: RIGHT HEART CATH;  Surgeon: [name], MD;  Location: [place] [facility];  Service: Cardiovascular;  Laterality: N/A;  . TONSILLECTOMY    . UPPER GASTROINTESTINAL ENDOSCOPY  03/15/2011   EGD ED  BANDING/TCS  . UPPER GASTROINTESTINAL ENDOSCOPY  09/05/2010  . UPPER GASTROINTESTINAL ENDOSCOPY  08/11/2010  . VAGINAL HYSTERECTOMY       OB History   No obstetric history on file.      Home Medications    Prior to Admission medications   Medication Sig Start Date End Date Taking? Authorizing Provider  busPIRone (BUSPAR) 5 MG tablet Take 10 mg by mouth 2 (two) times daily.     [provider]  cholecalciferol (VITAMIN D) 1000 UNITS tablet Take 1,000 Units by mouth 3 (three) times a week.     [provider]  HUMALOG 100 UNIT/ML injection Inject 12-20 Units into the skin 3 (three) times daily with meals.  07/01/18   [provider]  Insulin Glargine (LANTUS SOLOSTAR) 100 UNIT/ML Solostar Pen Inject 40 Units into the skin daily at 10 pm. Patient taking differently: Inject 45 Units into the skin every morning.  07/31/18   Barton Dubois, MD  Insulin Pen Needle 31G X 5 MM MISC Use with each insulin injection 07/22/17   [name], [name], MD  lactulose [facility]) 10 GM/15ML solution TAKE  [place] DAILY Patient taking differently: Take 20 g by mouth 4 (four) times daily.  01/07/18   Rehman, Mechele Dawley, MD  levothyroxine (SYNTHROID, LEVOTHROID) 25 MCG tablet Take 25 mcg by mouth daily before breakfast.     [provider]  magnesium oxide (MAG-OX) 400 MG tablet Take 400 mg by mouth daily.     [provider]  multivitamin (VIT W/EXTRA C) CHEW chewable tablet Chew 1 tablet by mouth daily.    [provider]  OXYGEN Inhale 2 L into the lungs at bedtime.     [provider]  pantoprazole (PROTONIX) 40 MG tablet Take 1 tablet (40 mg total) by mouth 2 (two) times daily. 10/09/18   Rehman, Mechele Dawley, MD  potassium chloride (K-DUR) 10 MEQ tablet Take 1 tablet (10 mEq total) by mouth 2 (two) times daily. 11/07/18   [name], MD  rifaximin (XIFAXAN) 550 MG TABS tablet Take 1 tablet (550 mg total) by mouth 3  (three) times daily. Patient taking differently: Take 550 mg by mouth 2 (two) times daily.  07/31/18   Barton Dubois, MD  rosuvastatin (CRESTOR) 5 MG tablet Take 1 tablet (5 mg total) by mouth every other day. Patient not taking: Reported on 01/27/2019 06/27/17 10/09/18  [name], MD  sertraline (ZOLOFT) 50 MG tablet Take 50 mg by mouth daily.    [provider]  spironolactone (ALDACTONE) 50 MG tablet TAKE 1 TABLET BY MOUTH TWICE DAILY 09/16/18   Rehman, Mechele Dawley, MD  torsemide (DEMADEX) 20 MG tablet Take 1 tablet (20 mg total) by mouth daily. Please call for OV [phone] 02/03/19   [name], MD  trimethoprim-polymyxin b Mayra Neer) ophthalmic solution Place 1 drop into the left eye See admin instructions. Use 3 to 4 times daily for 2 days following monthly eye injection 05/06/18   [provider]  zinc sulfate 220 (50 Zn) MG capsule Take 1 capsule (220 mg total) by mouth daily. 08/01/18   Barton Dubois, MD    Family History Family History  Problem Relation Age of Onset  . Lung cancer Mother   . Diabetes Father   . Diabetes Sister   . Hypertension Sister   . Hypothyroidism Sister   . Colon cancer Brother   . Diabetes Sister   . Hypothyroidism Brother   . Healthy Daughter   . Obesity Daughter   . Hypertension Daughter     Social History Social History   Tobacco Use  . Smoking status: Never Smoker  . Smokeless tobacco: Never Used  Substance Use Topics  . Alcohol use: No    Alcohol/week: 0.0 standard drinks  . Drug use: No     Allergies   Ferumoxytol and Tramadol hcl   Review of Systems Review of Systems  Unable to perform ROS: Mental status change     Physical Exam Updated Vital Signs BP 115/84   Pulse 89   Temp 99.3 F (37.4 C) (Rectal)   Resp 19   Ht 1.626 m (5' 4" )   Wt 97.1 kg   SpO2 99%   BMI 36.74 kg/m   Physical Exam Vitals signs and nursing note reviewed.  Constitutional:      Appearance: She is well-developed.  She is obese.     Comments: Chronically ill-appearing, somnolent but arousable to voice  HENT:     Head: Normocephalic and atraumatic.     Mouth/Throat:     Mouth: Mucous membranes are moist.  Eyes:     General: Scleral icterus present.     Pupils: Pupils are equal, round, and reactive to light.  Cardiovascular:     Rate and Rhythm: Normal rate and regular rhythm.     Heart sounds: Normal heart sounds.  Pulmonary:     Effort: Pulmonary effort is normal. No respiratory distress.     Breath sounds: No wheezing.  Abdominal:     General: Bowel sounds are normal.     Palpations: Abdomen is soft.     Tenderness: There is no abdominal tenderness.     Comments: Abdominal scarring noted  Skin:    General: Skin is warm and dry.     Comments: Contusions and bruising noted over the right antecubital fossa  Neurological:     Mental Status: She is alert.     Comments: Somnolent but arousable to voice, unable to assess orientation, does not follow commands, will occasionally nod her head to answer questions but no verbal response, appears to move all 4 extremities spontaneously  Psychiatric:     Comments: Unable to assess      ED Treatments / Results  Labs (all labs ordered are listed, but only abnormal results are displayed) Labs Reviewed  COMPREHENSIVE METABOLIC PANEL - Abnormal; Notable for the following components:      Result Value   Chloride 113 (*)    Glucose, Bld 132 (*)    BUN 26 (*)    Calcium 8.2 (*)    Total Protein 4.7 (*)    Albumin 2.3 (*)    Total Bilirubin 3.0 (*)    All other components within normal limits  AMMONIA - Abnormal; Notable for the following components:   Ammonia 85 (*)    All other components within normal limits  URINALYSIS, ROUTINE W REFLEX MICROSCOPIC - Abnormal; Notable for the following components:   Nitrite POSITIVE (*)    Bacteria, UA MANY (*)    All other components within normal limits  PROTIME-INR - Abnormal; Notable for the following  components:   Prothrombin Time 19.3 (*)    INR 1.6 (*)    All other components within normal limits  SARS CORONAVIRUS 2 (HOSPITAL ORDER, [facility])  URINE CULTURE  CULTURE, BLOOD (ROUTINE X 2)  CULTURE, BLOOD (ROUTINE X 2)  CBC WITH DIFFERENTIAL/PLATELET  CBC WITH DIFFERENTIAL/PLATELET    EKG EKG Interpretation  Date/Time:  Wednesday February 26 2019 22:04:31 EDT Ventricular Rate:  86 PR Interval:    QRS Duration: 104 QT Interval:  389 QTC Calculation: 466 R Axis:   -58 Text Interpretation:  Sinus rhythm Prolonged PR interval Left anterior fascicular block Consider anterior infarct No significant change since last tracing Confirmed by Thayer Jew [phone]) on 02/26/2019 11:13:18 PM   Radiology Ct Head Wo Contrast  Result Date: 02/27/2019 CLINICAL DATA:  Altered mental status EXAM: CT HEAD WITHOUT CONTRAST TECHNIQUE: Contiguous axial images were obtained from the base of the skull through the vertex without intravenous contrast. COMPARISON:  07/18/2017 FINDINGS: Brain: No evidence of acute infarction, hemorrhage, hydrocephalus, extra-axial collection or mass lesion/mass effect. Age related volume loss is noted Vascular: No hyperdense vessel or unexpected calcification. Skull: Normal. Negative for fracture or focal lesion. Sinuses/Orbits: There is opacification of the left maxillary sinus and left ethmoid air cells, likely chronic. There is mucosal thickening of the frontal sinuses. The remaining paranasal sinuses and mastoid air cells are essentially clear. Other: None. IMPRESSION: No acute intracranial abnormality. Electronically Signed   By:  Constance Holster M.D.   On: 02/27/2019 00:07    Procedures Procedures (including critical care time)  CRITICAL CARE Performed by: Merryl Hacker   Total critical care time: 35 minutes  Critical care time was exclusive of separately billable procedures and treating other patients.  Critical care was  necessary to treat or prevent imminent or life-threatening deterioration.  Critical care was time spent personally by me on the following activities: development of treatment plan with patient and/or surrogate as well as nursing, discussions with consultants, evaluation of patient's response to treatment, examination of patient, obtaining history from patient or surrogate, ordering and performing treatments and interventions, ordering and review of laboratory studies, ordering and review of radiographic studies, pulse oximetry and re-evaluation of patient's condition.   Medications Ordered in ED Medications  cefTRIAXone (ROCEPHIN) 2 g in sodium chloride 0.9 % 100 mL IVPB (has no administration in time range)  lactulose (CHRONULAC) 10 GM/15ML solution 30 g (30 g Oral Given 02/27/19 0028)     Initial Impression / Assessment and Plan / ED Course  I have reviewed the triage vital signs and the nursing notes.  Pertinent labs & imaging results that were available during my care of the patient were reviewed by me and considered in my medical decision making (see chart for details).        Patient presents with altered mental status.  Appears encephalopathic on exam.  Vital signs are largely reassuring.  She is afebrile.  She does not have any focal abnormalities on exam but is generally somnolent and altered.  Considerations include hepatic encephalopathy, infection, head trauma.  Work-up initiated.  Ammonia level is 85.  Based on chart review, she has previously tolerated ammonia levels and this range without encephalopathic features although a this does not always clinically correlate.  Her CMP is at baseline.  Urinalysis does show nitrite positive urine with 0-5 white cells and many bacteria.  Will urine culture and treat for possible UTI.  This was a catheterized specimen.  She will need admission for IV antibiotics.  At this time she does not appear septic.  She was also given a dose of lactulose  as this is likely contributory as well.  Final Clinical Impressions(s) / ED Diagnoses   Final diagnoses:  Acute encephalopathy  Acute cystitis without hematuria    ED Discharge Orders    None       Merryl Hacker, MD 02/27/19 [phone]

## 2019-02-26 NOTE — Unmapped (Signed)
Name:  Sarra Rachels  DOB: 1955-11-10  Today's Date: 02/26/2019  Age:  63 y.o.    Today's Visit Included:   Indication for Testing: Patient is scheduled for an upcoming procedure, surgery or treatment requiring pre-testing for COVID-19.    Informed the patient that their surgeon will be notified of results in 24-48 hours.     Advised patient to self-isolate and remain in his/her home until the surgery to minimize risk of COVID-19 infection prior to surgery.    Also advised patient to notify his/her surgeon/specialist immediately if the patient develops any new symptoms such as:  []  Subjective fever  []  Chills  []  Muscle aches  []  Runny nose  []  Sore throat  []  Loss of taste or smell   []  Cough (new or worsening of chronic cough)  []  Shortness of breath  []  Diarrhea (3 or more loose stools in the last 24 hours)  []  Fatigue (new or worsening)  []  Abdominal pain  []  Nausea or vomiting  []  Headache     Advised all household members and close contacts to remain in the house as much as possible as well.     COVID-19 testing performed.    Meda Klinefelter, CMA

## 2019-02-27 ENCOUNTER — Telehealth (INDEPENDENT_AMBULATORY_CARE_PROVIDER_SITE_OTHER): Payer: Self-pay | Admitting: *Deleted

## 2019-02-27 DIAGNOSIS — D696 Thrombocytopenia, unspecified: Secondary | ICD-10-CM | POA: Diagnosis not present

## 2019-02-27 DIAGNOSIS — I509 Heart failure, unspecified: Secondary | ICD-10-CM | POA: Diagnosis not present

## 2019-02-27 DIAGNOSIS — K7460 Unspecified cirrhosis of liver: Secondary | ICD-10-CM | POA: Diagnosis not present

## 2019-02-27 DIAGNOSIS — R4182 Altered mental status, unspecified: Secondary | ICD-10-CM | POA: Diagnosis not present

## 2019-02-27 DIAGNOSIS — R8271 Bacteriuria: Secondary | ICD-10-CM | POA: Diagnosis not present

## 2019-02-27 DIAGNOSIS — M6250 Muscle wasting and atrophy, not elsewhere classified, unspecified site: Secondary | ICD-10-CM | POA: Diagnosis not present

## 2019-02-27 DIAGNOSIS — I5032 Chronic diastolic (congestive) heart failure: Secondary | ICD-10-CM

## 2019-02-27 DIAGNOSIS — K7200 Acute and subacute hepatic failure without coma: Secondary | ICD-10-CM | POA: Diagnosis not present

## 2019-02-27 DIAGNOSIS — D500 Iron deficiency anemia secondary to blood loss (chronic): Secondary | ICD-10-CM

## 2019-02-27 DIAGNOSIS — R531 Weakness: Secondary | ICD-10-CM | POA: Diagnosis not present

## 2019-02-27 DIAGNOSIS — G9340 Encephalopathy, unspecified: Secondary | ICD-10-CM

## 2019-02-27 DIAGNOSIS — N3000 Acute cystitis without hematuria: Secondary | ICD-10-CM

## 2019-02-27 LAB — COMPREHENSIVE METABOLIC PANEL
ALT: 24 U/L (ref 0–44)
AST: 27 U/L (ref 15–41)
Albumin: 2.3 g/dL — ABNORMAL LOW (ref 3.5–5.0)
Alkaline Phosphatase: 74 U/L (ref 38–126)
Anion gap: 6 (ref 5–15)
BUN: 26 mg/dL — ABNORMAL HIGH (ref 8–23)
CO2: 22 mmol/L (ref 22–32)
Calcium: 8.2 mg/dL — ABNORMAL LOW (ref 8.9–10.3)
Chloride: 113 mmol/L — ABNORMAL HIGH (ref 98–111)
Creatinine, Ser: 0.99 mg/dL (ref 0.44–1.00)
GFR calc Af Amer: >60 mL/min (ref >60)
GFR calc non Af Amer: >60 mL/min (ref >60)
Glucose, Bld: 132 mg/dL — ABNORMAL HIGH (ref 70–99)
Potassium: 3.8 mmol/L (ref 3.5–5.1)
Sodium: 141 mmol/L (ref 135–145)
Total Bilirubin: 3 mg/dL — ABNORMAL HIGH (ref 0.3–1.2)
Total Protein: 4.7 g/dL — ABNORMAL LOW (ref 6.5–8.1)

## 2019-02-27 LAB — CBC WITH DIFFERENTIAL/PLATELET
Abs Immature Granulocytes: 0.04 10*3/uL (ref 0.00–0.07)
Abs Immature Granulocytes: 0.02 10*3/uL (ref 0.00–0.07)
Basophils Absolute: 0 10*3/uL (ref 0.0–0.1)
Basophils Absolute: 0 10*3/uL (ref 0.0–0.1)
Basophils Relative: 0 %
Basophils Relative: 0 %
Eosinophils Absolute: 0.1 10*3/uL (ref 0.0–0.5)
Eosinophils Absolute: 0.1 10*3/uL (ref 0.0–0.5)
Eosinophils Relative: 1 %
Eosinophils Relative: 1 %
HCT: 23.6 % — ABNORMAL LOW (ref 36.0–46.0)
HCT: 21.3 % — ABNORMAL LOW (ref 36.0–46.0)
Hemoglobin: 7.4 g/dL — ABNORMAL LOW (ref 12.0–15.0)
Hemoglobin: 6.6 g/dL — CL (ref 12.0–15.0)
Immature Granulocytes: 1 %
Immature Granulocytes: 0 %
Lymphocytes Relative: 21 %
Lymphocytes Relative: 28 %
Lymphs Abs: 1.5 10*3/uL (ref 0.7–4.0)
Lymphs Abs: 1.3 10*3/uL (ref 0.7–4.0)
MCH: 27.3 pg (ref 26.0–34.0)
MCH: 27.7 pg (ref 26.0–34.0)
MCHC: 31.4 g/dL (ref 30.0–36.0)
MCHC: 31 g/dL (ref 30.0–36.0)
MCV: 87.1 fL (ref 80.0–100.0)
MCV: 89.5 fL (ref 80.0–100.0)
Monocytes Absolute: 0.8 10*3/uL (ref 0.1–1.0)
Monocytes Absolute: 0.5 10*3/uL (ref 0.1–1.0)
Monocytes Relative: 11 %
Monocytes Relative: 10 %
Neutro Abs: 4.7 10*3/uL (ref 1.7–7.7)
Neutro Abs: 2.9 10*3/uL (ref 1.7–7.7)
Neutrophils Relative %: 66 %
Neutrophils Relative %: 61 %
Platelets: 50 10*3/uL — ABNORMAL LOW (ref 150–400)
Platelets: 46 10*3/uL — ABNORMAL LOW (ref 150–400)
RBC: 2.71 MIL/uL — ABNORMAL LOW (ref 3.87–5.11)
RBC: 2.38 MIL/uL — ABNORMAL LOW (ref 3.87–5.11)
RDW: 17.5 % — ABNORMAL HIGH (ref 11.5–15.5)
RDW: 17.6 % — ABNORMAL HIGH (ref 11.5–15.5)
WBC: 7 10*3/uL (ref 4.0–10.5)
WBC: 4.7 10*3/uL (ref 4.0–10.5)
nRBC: 0 % (ref 0.0–0.2)
nRBC: 0 % (ref 0.0–0.2)

## 2019-02-27 LAB — URINALYSIS, ROUTINE W REFLEX MICROSCOPIC
Bilirubin Urine: NEGATIVE
Glucose, UA: NEGATIVE mg/dL
Hgb urine dipstick: NEGATIVE
Ketones, ur: NEGATIVE mg/dL
Leukocytes,Ua: NEGATIVE
Nitrite: POSITIVE — AB
Protein, ur: NEGATIVE mg/dL
Specific Gravity, Urine: 1.017 (ref 1.005–1.030)
pH: 8 (ref 5.0–8.0)

## 2019-02-27 LAB — AMMONIA
Ammonia: 85 umol/L — ABNORMAL HIGH (ref 9–35)
Ammonia: 68 umol/L — ABNORMAL HIGH (ref 9–35)

## 2019-02-27 LAB — BASIC METABOLIC PANEL
Anion gap: 7 (ref 5–15)
BUN: 24 mg/dL — ABNORMAL HIGH (ref 8–23)
CO2: 22 mmol/L (ref 22–32)
Calcium: 8.2 mg/dL — ABNORMAL LOW (ref 8.9–10.3)
Chloride: 113 mmol/L — ABNORMAL HIGH (ref 98–111)
Creatinine, Ser: 0.98 mg/dL (ref 0.44–1.00)
GFR calc Af Amer: >60 mL/min (ref >60)
GFR calc non Af Amer: >60 mL/min (ref >60)
Glucose, Bld: 112 mg/dL — ABNORMAL HIGH (ref 70–99)
Potassium: 3.6 mmol/L (ref 3.5–5.1)
Sodium: 142 mmol/L (ref 135–145)

## 2019-02-27 LAB — GLUCOSE, CAPILLARY
Glucose-Capillary: 96 mg/dL (ref 70–99)
Glucose-Capillary: 100 mg/dL — ABNORMAL HIGH (ref 70–99)
Glucose-Capillary: 223 mg/dL — ABNORMAL HIGH (ref 70–99)

## 2019-02-27 LAB — PREPARE RBC (CROSSMATCH)

## 2019-02-27 LAB — SARS CORONAVIRUS 2 BY RT PCR (HOSPITAL ORDER, PERFORMED IN ~~LOC~~ HOSPITAL LAB): SARS Coronavirus 2: NEGATIVE

## 2019-02-27 LAB — HEMOGLOBIN AND HEMATOCRIT, BLOOD
HCT: 25.7 % — ABNORMAL LOW (ref 36.0–46.0)
Hemoglobin: 8 g/dL — ABNORMAL LOW (ref 12.0–15.0)

## 2019-02-27 MED ORDER — SODIUM CHLORIDE 0.9 % IV SOLN
2.0000 g | Freq: Once | INTRAVENOUS | Status: AC
  Administered 2019-02-27: 2 g via INTRAVENOUS
  Filled 2019-02-27: qty 20

## 2019-02-27 MED ORDER — SODIUM CHLORIDE 0.9 % IV SOLN
1.0000 g | INTRAVENOUS | Status: DC
  Administered 2019-02-27: 1 g via INTRAVENOUS
  Filled 2019-02-27: qty 10

## 2019-02-27 MED ORDER — RIFAXIMIN 550 MG PO TABS: 550.0000 mg | ORAL_TABLET | Freq: Two times a day (BID) | ORAL | Status: AC

## 2019-02-27 MED ORDER — LACTULOSE 10 GM/15ML PO SOLN: 30.0000 g | Freq: Three times a day (TID) | ORAL | Status: DC

## 2019-02-27 MED ORDER — ONDANSETRON HCL 4 MG/2ML IJ SOLN: 4.0000 mg | Freq: Four times a day (QID) | INTRAMUSCULAR | Status: DC | PRN

## 2019-02-27 MED ORDER — PANTOPRAZOLE SODIUM 40 MG PO TBEC
40.0000 mg | DELAYED_RELEASE_TABLET | Freq: Two times a day (BID) | ORAL | Status: DC
  Administered 2019-02-27 (×2): 40 mg via ORAL
  Filled 2019-02-27 (×2): qty 1

## 2019-02-27 MED ORDER — ONDANSETRON HCL 4 MG PO TABS: 4.0000 mg | ORAL_TABLET | Freq: Four times a day (QID) | ORAL | Status: DC | PRN

## 2019-02-27 MED ORDER — LEVOTHYROXINE SODIUM 25 MCG PO TABS: 25.0000 ug | ORAL_TABLET | Freq: Every day | ORAL | Status: DC

## 2019-02-27 MED ORDER — SPIRONOLACTONE 25 MG PO TABS
50.0000 mg | ORAL_TABLET | Freq: Two times a day (BID) | ORAL | Status: DC
  Administered 2019-02-27 (×2): 50 mg via ORAL
  Filled 2019-02-27 (×2): qty 2

## 2019-02-27 MED ORDER — POTASSIUM CHLORIDE CRYS ER 10 MEQ PO TBCR
10.0000 meq | EXTENDED_RELEASE_TABLET | Freq: Two times a day (BID) | ORAL | Status: DC
  Administered 2019-02-27 (×2): 10 meq via ORAL
  Filled 2019-02-27 (×2): qty 1

## 2019-02-27 MED ORDER — BUSPIRONE HCL 5 MG PO TABS
10.0000 mg | ORAL_TABLET | Freq: Two times a day (BID) | ORAL | Status: DC
  Administered 2019-02-27 (×2): 10 mg via ORAL
  Filled 2019-02-27 (×2): qty 2

## 2019-02-27 MED ORDER — LACTULOSE ENEMA: 300.0000 mL | Freq: Once | ORAL | Status: DC

## 2019-02-27 MED ORDER — INSULIN ASPART 100 UNIT/ML ~~LOC~~ SOLN
0.0000 [IU] | Freq: Three times a day (TID) | SUBCUTANEOUS | Status: DC
  Administered 2019-02-27: 3 [IU] via SUBCUTANEOUS

## 2019-02-27 MED ORDER — SODIUM CHLORIDE 0.9 % IV SOLN: 1.0000 g | INTRAVENOUS | Status: DC

## 2019-02-27 MED ORDER — CEFTRIAXONE SODIUM 1 G IJ SOLR: 1.0000 g | INTRAMUSCULAR | 0 refills | Status: DC

## 2019-02-27 MED ORDER — SODIUM CHLORIDE 0.9% IV SOLUTION
Freq: Once | INTRAVENOUS | Status: AC
  Administered 2019-02-27: 15:00:00 via INTRAVENOUS

## 2019-02-27 MED ORDER — SERTRALINE HCL 50 MG PO TABS
50.0000 mg | ORAL_TABLET | Freq: Every day | ORAL | Status: DC
  Administered 2019-02-27: 11:00:00 50 mg via ORAL
  Filled 2019-02-27: qty 1

## 2019-02-27 MED ORDER — LACTULOSE 10 GM/15ML PO SOLN
30.0000 g | Freq: Once | ORAL | Status: AC
  Administered 2019-02-27: 30 g via ORAL
  Filled 2019-02-27: qty 60

## 2019-02-27 MED ORDER — INSULIN GLARGINE 100 UNIT/ML ~~LOC~~ SOLN
25.0000 [IU] | Freq: Every day | SUBCUTANEOUS | Status: DC
  Administered 2019-02-27: 25 [IU] via SUBCUTANEOUS
  Filled 2019-02-27: qty 0.25

## 2019-02-27 MED ORDER — RIFAXIMIN 550 MG PO TABS
550.0000 mg | ORAL_TABLET | Freq: Two times a day (BID) | ORAL | Status: DC
  Administered 2019-02-27 (×2): 550 mg via ORAL
  Filled 2019-02-27 (×2): qty 1

## 2019-02-27 MED ORDER — VITAMIN D 25 MCG (1000 UNIT) PO TABS
1000.0000 [IU] | ORAL_TABLET | ORAL | Status: DC
  Filled 2019-02-27: qty 1

## 2019-02-27 MED ORDER — MAGNESIUM OXIDE 400 (241.3 MG) MG PO TABS
400.0000 mg | ORAL_TABLET | Freq: Every day | ORAL | Status: DC
  Administered 2019-02-27: 400 mg via ORAL
  Filled 2019-02-27: qty 1

## 2019-02-27 MED ORDER — ADULT MULTIVITAMIN W/MINERALS CH
1.0000 | ORAL_TABLET | Freq: Every day | ORAL | Status: DC
  Administered 2019-02-27: 1 via ORAL
  Filled 2019-02-27: qty 1

## 2019-02-27 MED ORDER — LACTULOSE 10 GM/15ML PO SOLN
30.0000 g | Freq: Three times a day (TID) | ORAL | Status: DC
  Administered 2019-02-27 (×3): 30 g via ORAL
  Filled 2019-02-27 (×3): qty 60

## 2019-02-27 MED ORDER — TORSEMIDE 20 MG PO TABS
20.0000 mg | ORAL_TABLET | Freq: Every day | ORAL | Status: DC
  Administered 2019-02-27: 11:00:00 20 mg via ORAL
  Filled 2019-02-27: qty 1

## 2019-02-27 MED ORDER — ACETAMINOPHEN 325 MG PO TABS
650.0000 mg | ORAL_TABLET | Freq: Once | ORAL | Status: AC
  Administered 2019-02-27: 650 mg via ORAL
  Filled 2019-02-27: qty 2

## 2019-02-27 MED ORDER — ZINC SULFATE 220 (50 ZN) MG PO CAPS
220.0000 mg | ORAL_CAPSULE | Freq: Every day | ORAL | Status: DC
  Administered 2019-02-27: 220 mg via ORAL
  Filled 2019-02-27: qty 1

## 2019-02-27 MED ORDER — SODIUM CHLORIDE 0.45 % IV SOLN
INTRAVENOUS | Status: DC
  Administered 2019-02-27: 13:00:00 via INTRAVENOUS

## 2019-02-27 NOTE — Progress Notes (Signed)
Patient seen and examined. Admitted after midnight secondary to AMs in the setting of hepatic encephalopathy and presumed UTI. Still mildly confused on exam, but hemodynamically stable and afebrile. Please refer to H7P written by Dr. Olevia Bowens for further info/details on admission.  Plan: -advance diet -resume home meds -follow clinical response -follow culture data results -continue supportive care and follow clinical response -hopefully discharge in am. -Transfuse 1 unit of PRBCs -get GI service involved.  Barton Dubois MD [phone]

## 2019-02-27 NOTE — Progress Notes (Signed)
Patient just had large bowel movement while in bed. Stool is not black. Patient is receiving a unit of PRBCs. She is less drowsy than this morning. Patient is to be transferred to [facility] later this evening. Patient's husband Ronalee Belts called again and condition updated.

## 2019-02-27 NOTE — Progress Notes (Signed)
CRITICAL VALUE ALERT  Critical Value:  Hgb 6.6  Date & Time Notied:  02/27/2019 0841  Provider Notified: [place]  Orders Received/Actions taken: awaiting instructions

## 2019-02-27 NOTE — Progress Notes (Signed)
Spoke with husband of patient, updated on status of wife. Husband very adamant for Dr. Laural Golden to be consulted, stated that patient has EGD scheduled in [facility] 02/28/2019. Dr. Philomena Doheny made aware, will continue to monitor.

## 2019-02-27 NOTE — Telephone Encounter (Signed)
Patient's husband called the office this morning. He is very concerned and as he stated ,"I am in a panic mode." He states that his wife , Keyona was discharged from [facility] on Tuesday . He noticed then that she was slow in her talking and real tired. Early yesterday morning , 02/26/2019 he checked on her at 3 am , she was okay. He checked on her again between 6 - 6:30 am, he found her on the floor. He is not sure if she rolled off the bed or got up and fell. EMS was called. They assisted in getting her up. At this time she was able to walk with walkerand follow command , but slowly. EMS ask husband if he wanted her transported to [facility]. He declined because she had appointment at [facility] for CO-VID 19 testing , as the patient is to have a procedure at [facility] on 02/28/2019. This was done at 2 pm on 02/26/2019. Patient was very sleepy. Yesterday afternoon @ 6 pm she had a stare look about her and was not responding to her husband. So, around 7:30 - 8 pm he and his nephew took patient to [facility] ED, after she could not hold her fork to eat.  He says that a scan was done and that around 2 am she was readmitted to the hospital. Mr. Hakim says that he told them several times to please let Dr.Rehman know that she was in the hospital.Due to the CO-Vid -19 he is not allowed to be with or stay with his wife. He is very upset over this as he does not feel she can properly communicate or understand. He shares that he called this hospital at 7 am to check on her and was ask to call back in 20 minutes as they were in the middle of a shift change. He called 3 more times with no answer. Nor had he heard from her nurse. While on the phone with me , her nurse Mardene Celeste called him, He called me back to say that he was told that she had been sleeping since 7 am and she would not be bothered. Then, he was told that she may get more blood as her hemoglobin was low. He also found out that Dr.Rehman  had not been made aware she was readmitted.  Mr. Willenbring feels that she should have someone with her to be her advocate due to her condition.  Also her coordinator at [facility] called to follow up on tomorrow's procedure.She is asking that Dr.Rehman call her and give her a status on the patient. [phone].  Dr.Rehman has been made aware that she is inpatient.

## 2019-02-27 NOTE — Progress Notes (Signed)
[place] [facility] here for transport to [facility] hospital. Family notified and aware, all questions answered. Report given to nkonde at 2030, all questions answered. VSS.  Pt alert to person with eyes open aware of transport at this time.

## 2019-02-27 NOTE — Progress Notes (Signed)
Spoke with Ishmael Holter and Roselyn Reef at [employer]. Brief report given to [facility]. Ground transport to be set up by them. VSS. pRBC transfusion complete. Attempted to call floor to give report, unable. Per Heidi, staff on floor should be available to take report approx. [phone]. Will report to oncoming shift. Patient sitting in room, alert with eyes open. IVF infusing. Bed alarm on, will continue to monitor.

## 2019-02-27 NOTE — Discharge Summary (Signed)
Physician Discharge Summary  Maria [name] WGN:[id number] DOB: 1956-06-29 DOA: 02/26/2019  PCP: Glenda Chroman, MD  Admit date: 02/26/2019 Discharge date: 02/27/2019  Time spent: 35 minutes  Recommendations for Outpatient Follow-up:  1. Follow hemoglobin trend 2. Patient transferred to [facility] for further evaluation and management of acute on chronic iron deficiency anemia and nonalcoholic cirrhosis.   Discharge Diagnoses:  Principal Problem:   Altered mental status Active Problems:   Cirrhosis, nonalcoholic (HCC)   Thrombocytopenia (HCC)   Chronic diastolic heart failure (HCC)   Hypothyroidism   Anemia   Acute hepatic encephalopathy   Bacteriuria type 2 diabetes Class 2 obesity  Discharge Condition: Stable overall.  Patient will be transferred to [facility] for further evaluation and management of her acute on chronic iron deficiency anemia and liver dysfunction from nonalcoholic cirrhosis.  Diet recommendation: Low calorie, modified carbohydrates and heart healthy diet.  Filed Weights   02/26/19 2159 02/27/19 0438  Weight: 97.1 kg 94.3 kg    History of present illness:  As per H&P written by Dr. Olevia Bowens on 02/27/2019 63 y.o. female with medical history significant of anemia, chronic diastolic CHF, depression, type 2 diabetes, fibromyalgia, hyperlipidemia, hypertension, hypothyroidism, osteoarthritis, osteoporosis, NASH with liver cirrhosis, who was just discharged yesterday after receiving 3 units of PRBCs due to chronic blood loss anemia and is returning today due to progressively walls altered mental status for the past 2 days according to her husband's account.  She is unable to provide further history, but picture is similar to previous instances that she has had hyperammonemia.  ED Course: Initial vital signs were temperature 99.3 F, pulse 88, respiration 20, blood pressure 126/43 mmHg and O2 sat was 99% on room air.  The patient was given  lactulose 30 g p.o. x1 and ceftriaxone 1 g IVPB.  Blood and urine cultures were drawn.  Her urinalysis shows positive nitrates and many bacteria, but negative leukocyte esterase and no significant WBC on microscopic examination.  White count on CBC was 7.0 with a normal differential, hemoglobin 7.4 g/dL and platelets 50.  Sodium 141, potassium 3.8, chloride 113 and CO2 22 mmol/L.  Calcium 8.2, glucose 132, BUN 26 and creatinine 0.99 mg/dL.  Total protein was 4.7 and albumin 2.3 g/dL.  Transaminases were normal.  Total bilirubin was 3.0 mg/dL.  CT head without contrast was negative for acute intracranial pathology.  Hospital Course:  Altered mental status/acute hepatic encephalopathy -Appears to be secondary to lack of proper use of lactulose. -Lactulose has been resumed and the patient has been placed back on rifaximin -Condition appropriately improving. -Continue per the recommendations by hepatologist/liver transplant team at [facility].  UTI -Urinalysis demonstrating concern for active infection -Patient denies dysuria but expressed increased frequency -Altered mental status on admission, most likely triggered by hepatic encephalopathy but with concerns of any contribution by UTI. -Continue empiric therapy with Rocephin -Follow final culture data.  Acute on chronic iron deficiency anemia secondary to chronic blood loss -In the setting of increased portal hypertension and gastropathy -Recent admission where she got 3 units of PRBCs -Hemoglobin 6.6 this morning -Extra unit of PRBCs has been given -Continue to follow hemoglobin trend. -Patient is a scheduled for endoscopic procedure at [facility] on 02/28/2019 -Follow further recommendations by gastroenterology/liver transplant team at receiving facility.  Chronic diastolic heart failure -Continue diuretics -Appears compensated and stable -Follow daily weights -Low-sodium diet has been encouraged.  Hypothyroidism -Continue  Synthroid  Type 2 diabetes mellitus with long-term  usage of insulin -Continue to follow CBGs -Continue sliding scale insulin and the use of Lantus  Thrombocytopenia -In the setting of chronic cirrhosis -Platelets count appears to be at baseline -Continue monitoring trend -SCDs for DVT prophylaxis. -Avoid NSAIDs and heparin products.  Nonalcoholic cirrhosis -Continue outpatient follow-up with transplant team at [facility] -Patient also locally followed by Dr. Wonda Amis (gastroenterologist) -continue PPI -continue spironolactone, Demadex, lactulose and rifaximin.  Class II obesity -Body mass index is 35.68 kg/m. -Low calorie diet and portion control discussed with patient.  Procedures:  None  Consultations:  Gastroenterology service  Discharge Exam: Vitals:   02/27/19 0438 02/27/19 1535  BP: (!) 130/51 (!) 120/57  Pulse: 84 86  Resp: 16 20  Temp: 98.7 F (37.1 C) 98.7 F (37.1 C)  SpO2: 100% 99%    General: Afebrile, no chest pain, no shortness of breath.  Patient mentation appropriately improving after resumption of lactulose and rifaximin. Cardiovascular: S1-S2, no rubs, no gallops, no JVD on exam. Respiratory: Normal respiratory effort, no using accessory muscles.  Good air movement bilaterally. Abdomen: Soft, nontender, positive bowel sounds on exam. Extremities: Trace edema bilaterally, no cyanosis, no clubbing.  Discharge Instructions   Discharge Instructions    Diet - low sodium heart healthy   Complete by: As directed    Discharge instructions   Complete by: As directed    -Continue rifaximin and lactulose to control ammonia level and improve hepatic encephalopathy. -Continue antibiotics for UTI -Further evaluation for chronic GI bleed and underlying liver cirrhosis as per hepatologist at [facility].     Allergies as of 02/27/2019      Reactions   Ferumoxytol Other (See Comments)   Patient has severe back and chest pain when receiving  FERAHEME infusions. Patient has severe back and chest pain when receiving FERAHEME infusions.   Tramadol Hcl Other (See Comments)   WEAKNESS WEAKNESS      Medication List    TAKE these medications   busPIRone 5 MG tablet Commonly known as: BUSPAR Take 10 mg by mouth 2 (two) times daily.   cefTRIAXone 1 g in sodium chloride 0.9 % 100 mL Inject 1 g into the vein daily.   cholecalciferol 1000 units tablet Commonly known as: VITAMIN D Take 1,000 Units by mouth 3 (three) times a week.   HumaLOG 100 UNIT/ML injection Generic drug: insulin lispro Inject 12-20 Units into the skin 3 (three) times daily with meals.   Insulin Glargine 100 UNIT/ML Solostar Pen Commonly known as: Lantus SoloStar Inject 40 Units into the skin daily at 10 pm. What changed:   how much to take  when to take this   Insulin Pen Needle 31G X 5 MM Misc Use with each insulin injection   lactulose 10 GM/15ML solution Commonly known as: CHRONULAC Take 45 mLs (30 g total) by mouth 3 (three) times daily. What changed: See the new instructions.   levothyroxine 25 MCG tablet Commonly known as: SYNTHROID Take 25 mcg by mouth daily before breakfast.   magnesium oxide 400 MG tablet Commonly known as: MAG-OX Take 400 mg by mouth daily.   multivitamin Chew chewable tablet Chew 1 tablet by mouth daily.   OXYGEN Inhale 2 L into the lungs at bedtime.   pantoprazole 40 MG tablet Commonly known as: PROTONIX Take 1 tablet (40 mg total) by mouth 2 (two) times daily.   potassium chloride 10 MEQ tablet Commonly known as: K-DUR Take 1 tablet (10 mEq total) by mouth 2 (two) times daily.  rifaximin 550 MG Tabs tablet Commonly known as: XIFAXAN Take 1 tablet (550 mg total) by mouth 2 (two) times daily.   sertraline 50 MG tablet Commonly known as: ZOLOFT Take 50 mg by mouth daily.   spironolactone 50 MG tablet Commonly known as: ALDACTONE TAKE 1 TABLET BY MOUTH TWICE DAILY   torsemide 20 MG  tablet Commonly known as: DEMADEX Take 1 tablet (20 mg total) by mouth daily. Please call for OV [phone]   trimethoprim-polymyxin b ophthalmic solution Commonly known as: POLYTRIM Place 1 drop into the left eye See admin instructions. Use 3 to 4 times daily for 2 days following monthly eye injection   zinc sulfate 220 (50 Zn) MG capsule Take 1 capsule (220 mg total) by mouth daily.      Allergies  Allergen Reactions  . Ferumoxytol Other (See Comments)    Patient has severe back and chest pain when receiving FERAHEME infusions. Patient has severe back and chest pain when receiving FERAHEME infusions.  . Tramadol Hcl Other (See Comments)    WEAKNESS WEAKNESS      The results of significant diagnostics from this hospitalization (including imaging, microbiology, ancillary and laboratory) are listed below for reference.    Significant Diagnostic Studies: Ct Head Wo Contrast  Result Date: 02/27/2019 CLINICAL DATA:  Altered mental status EXAM: CT HEAD WITHOUT CONTRAST TECHNIQUE: Contiguous axial images were obtained from the base of the skull through the vertex without intravenous contrast. COMPARISON:  07/18/2017 FINDINGS: Brain: No evidence of acute infarction, hemorrhage, hydrocephalus, extra-axial collection or mass lesion/mass effect. Age related volume loss is noted Vascular: No hyperdense vessel or unexpected calcification. Skull: Normal. Negative for fracture or focal lesion. Sinuses/Orbits: There is opacification of the left maxillary sinus and left ethmoid air cells, likely chronic. There is mucosal thickening of the frontal sinuses. The remaining paranasal sinuses and mastoid air cells are essentially clear. Other: None. IMPRESSION: No acute intracranial abnormality. Electronically Signed   By: Constance Holster M.D.   On: 02/27/2019 00:07    Microbiology: Recent Results (from the past 240 hour(s))  SARS Coronavirus 2 ([facility] - Performed in [place] hospital lab),  Hosp Order     Status: None   Collection Time: 02/24/19  5:59 PM   Specimen: Nasopharyngeal Swab  Result Value Ref Range Status   SARS Coronavirus 2 NEGATIVE NEGATIVE Final    Comment: (NOTE) If result is NEGATIVE SARS-CoV-2 target nucleic acids are NOT DETECTED. The SARS-CoV-2 RNA is generally detectable in upper and lower  respiratory specimens during the acute phase of infection. The lowest  concentration of SARS-CoV-2 viral copies this assay can detect is 250  copies / mL. A negative result does not preclude SARS-CoV-2 infection  and should not be used as the sole basis for treatment or other  patient management decisions.  A negative result may occur with  improper specimen collection / handling, submission of specimen other  than nasopharyngeal swab, presence of viral mutation(s) within the  areas targeted by this assay, and inadequate number of viral copies  (<250 copies / mL). A negative result must be combined with clinical  observations, patient history, and epidemiological information. If result is POSITIVE SARS-CoV-2 target nucleic acids are DETECTED. The SARS-CoV-2 RNA is generally detectable in upper and lower  respiratory specimens dur ing the acute phase of infection.  Positive  results are indicative of active infection with SARS-CoV-2.  Clinical  correlation with patient history and other diagnostic information is  necessary to determine patient infection  status.  Positive results do  not rule out bacterial infection or co-infection with other viruses. If result is PRESUMPTIVE POSTIVE SARS-CoV-2 nucleic acids MAY BE PRESENT.   A presumptive positive result was obtained on the submitted specimen  and confirmed on repeat testing.  While 2019 novel coronavirus  (SARS-CoV-2) nucleic acids may be present in the submitted sample  additional confirmatory testing may be necessary for epidemiological  and / or clinical management purposes  to differentiate between   SARS-CoV-2 and other Sarbecovirus currently known to infect humans.  If clinically indicated additional testing with an alternate test  methodology [phone]) is advised. The SARS-CoV-2 RNA is generally  detectable in upper and lower respiratory sp ecimens during the acute  phase of infection. The expected result is Negative. Fact Sheet for Patients:  StrictlyIdeas.no Fact Sheet for Healthcare Providers: BankingDealers.co.za This test is not yet approved or cleared by the [place] FDA and has been authorized for detection and/or diagnosis of SARS-CoV-2 by FDA under an Emergency Use Authorization (EUA).  This EUA will remain in effect (meaning this test can be used) for the duration of the COVID-19 declaration under Section 564(b)(1) of the Act, 21 U.S.C. section 360bbb-3(b)(1), unless the authorization is terminated or revoked sooner. Performed at [facility], [address], [place], [place] [address]   SARS Coronavirus 2 ([place] - Performed in [facility] hospital lab), Hosp Order     Status: None   Collection Time: 02/26/19 10:56 PM   Specimen: Nasopharyngeal Swab  Result Value Ref Range Status   SARS Coronavirus 2 NEGATIVE NEGATIVE Final    Comment: (NOTE) If result is NEGATIVE SARS-CoV-2 target nucleic acids are NOT DETECTED. The SARS-CoV-2 RNA is generally detectable in upper and lower  respiratory specimens during the acute phase of infection. The lowest  concentration of SARS-CoV-2 viral copies this assay can detect is 250  copies / mL. A negative result does not preclude SARS-CoV-2 infection  and should not be used as the sole basis for treatment or other  patient management decisions.  A negative result may occur with  improper specimen collection / handling, submission of specimen other  than nasopharyngeal swab, presence of viral mutation(s) within the  areas targeted by this assay, and inadequate number of viral  copies  (<250 copies / mL). A negative result must be combined with clinical  observations, patient history, and epidemiological information. If result is POSITIVE SARS-CoV-2 target nucleic acids are DETECTED. The SARS-CoV-2 RNA is generally detectable in upper and lower  respiratory specimens dur ing the acute phase of infection.  Positive  results are indicative of active infection with SARS-CoV-2.  Clinical  correlation with patient history and other diagnostic information is  necessary to determine patient infection status.  Positive results do  not rule out bacterial infection or co-infection with other viruses. If result is PRESUMPTIVE POSTIVE SARS-CoV-2 nucleic acids MAY BE PRESENT.   A presumptive positive result was obtained on the submitted specimen  and confirmed on repeat testing.  While 2019 novel coronavirus  (SARS-CoV-2) nucleic acids may be present in the submitted sample  additional confirmatory testing may be necessary for epidemiological  and / or clinical management purposes  to differentiate between  SARS-CoV-2 and other Sarbecovirus currently known to infect humans.  If clinically indicated additional testing with an alternate test  methodology [phone]) is advised. The SARS-CoV-2 RNA is generally  detectable in upper and lower respiratory sp ecimens during the acute  phase of infection. The expected result  is Negative. Fact Sheet for Patients:  StrictlyIdeas.no Fact Sheet for Healthcare Providers: BankingDealers.co.za This test is not yet approved or cleared by the [place] FDA and has been authorized for detection and/or diagnosis of SARS-CoV-2 by FDA under an Emergency Use Authorization (EUA).  This EUA will remain in effect (meaning this test can be used) for the duration of the COVID-19 declaration under Section 564(b)(1) of the Act, 21 U.S.C. section 360bbb-3(b)(1), unless the authorization is terminated  or revoked sooner. Performed at [facility], [address], [place], [place] [address]   Blood culture (routine x 2)     Status: None (Preliminary result)   Collection Time: 02/27/19  1:15 AM   Specimen: BLOOD  Result Value Ref Range Status   Specimen Description BLOOD LEFT ANTECUBITAL  Final   Special Requests   Final    BOTTLES DRAWN AEROBIC AND ANAEROBIC Blood Culture results may not be optimal due to an inadequate volume of blood received in culture bottles   Culture   Final    NO GROWTH < 12 HOURS Performed at [facility], [address] [place], [place] [address]    Report Status PENDING  Incomplete  Blood culture (routine x 2)     Status: None (Preliminary result)   Collection Time: 02/27/19  1:26 AM   Specimen: BLOOD LEFT HAND  Result Value Ref Range Status   Specimen Description BLOOD LEFT HAND  Final   Special Requests   Final    BOTTLES DRAWN AEROBIC AND ANAEROBIC Blood Culture adequate volume   Culture   Final    NO GROWTH < 12 HOURS Performed at [facility], [address] [place], [place] [address]    Report Status PENDING  Incomplete     Labs: Basic Metabolic Panel: Recent Labs  Lab 02/24/19 1145 02/25/19 0339 02/26/19 2314 02/27/19 0732  NA 140 139 141 142  K 4.2 4.1 3.8 3.6  CL 112* 114* 113* 113*  CO2 20* 18* 22 22  GLUCOSE 230* 168* 132* 112*  BUN 18 21 26* 24*  CREATININE 0.93 0.95 0.99 0.98  CALCIUM 8.7* 8.0* 8.2* 8.2*   Liver Function Tests: Recent Labs  Lab 02/26/19 2314  AST 27  ALT 24  ALKPHOS 74  BILITOT 3.0*  PROT 4.7*  ALBUMIN 2.3*   Recent Labs  Lab 02/24/19 1145 02/26/19 2314 02/27/19 0732  AMMONIA 57* 85* 68*   CBC: Recent Labs  Lab 02/24/19 1145 02/25/19 0339 02/25/19 0808 02/25/19 1329 02/27/19 0006 02/27/19 0732  WBC 3.3* 4.7  --   --  7.0 4.7  NEUTROABS  --   --   --   --  4.7 2.9  HGB 4.8* 6.5* 6.7* 7.9* 7.4* 6.6*  HCT 17.6* 22.3* 21.9* 25.0* 23.6* 21.3*  MCV 91.2 91.4  --   --  87.1 89.5  PLT 65*  50*  --   --  50* 46*   BNP: BNP (last 3 results) Recent Labs    07/02/18 1736  BNP 20.0   CBG: Recent Labs  Lab 02/24/19 2112 02/25/19 0727 02/25/19 1112 02/27/19 0752 02/27/19 1114  GLUCAP 74 116* 207* 96 100*   Signed:  Barton Dubois MD.  [facility] 02/27/2019, 3:49 PM

## 2019-02-27 NOTE — H&P (Signed)
History and Physical    Maria [name] LPF:[id number] DOB: 05/18/1956 DOA: 02/26/2019  PCP: Glenda Chroman, MD   Patient coming from: Home.  I have personally briefly reviewed patient's old medical records in [place]  Chief Complaint: AMS.  HPI: Maria [name] is a 62 y.o. female with medical history significant of anemia, chronic diastolic CHF, depression, type 2 diabetes, fibromyalgia, hyperlipidemia, hypertension, hypothyroidism, osteoarthritis, osteoporosis, NASH with liver cirrhosis, who was just discharged yesterday after receiving 3 units of PRBCs due to chronic blood loss anemia and is returning today due to progressively walls altered mental status for the past 2 days according to her husband's account.  She is unable to provide further history, but picture is similar to previous instances that she has had hyperammonemia.  ED Course: Initial vital signs were temperature 99.3 F, pulse 88, respiration 20, blood pressure 126/43 mmHg and O2 sat was 99% on room air.  The patient was given lactulose 30 g p.o. x1 and ceftriaxone 1 g IVPB.  Blood and urine cultures were drawn.  Her urinalysis shows positive nitrates and many bacteria, but negative leukocyte esterase and no significant WBC on microscopic examination.  White count on CBC was 7.0 with a normal differential, hemoglobin 7.4 g/dL and platelets 50.  Sodium 141, potassium 3.8, chloride 113 and CO2 22 mmol/L.  Calcium 8.2, glucose 132, BUN 26 and creatinine 0.99 mg/dL.  Total protein was 4.7 and albumin 2.3 g/dL.  Transaminases were normal.  Total bilirubin was 3.0 mg/dL.  CT head without contrast was negative for acute intracranial pathology.  Review of Systems: Unable to obtain.  Past Medical History:  Diagnosis Date  . Anemia   . CHF (congestive heart failure) ([place])   . Cirrhosis ([place])   . Depression   . Diabetes mellitus   . Esophageal bleed, non-variceal   . Eye hemorrhage    Behind left eye  . Fibromyalgia    . Hypercholesteremia   . Hypertension   . Hypothyroidism   . NAFLD (nonalcoholic fatty liver disease)   . Osteoarthrosis   . Osteoporosis   . PONV (postoperative nausea and vomiting)   . UTI (urinary tract infection) 11/12 end of the month   Patient feels that she passed a kidney stone at that time    Past Surgical History:  Procedure Laterality Date  . APPENDECTOMY  1980  . BILATERAL SALPINGOOPHORECTOMY    . CHOLECYSTECTOMY    . COLONOSCOPY  03/15/2011  . COLONOSCOPY N/A 03/24/2016   Procedure: COLONOSCOPY;  Surgeon: [name], MD;  [facility];  Service: Endoscopy;  Laterality: N/A;  855  . ESOPHAGEAL BANDING  04/24/2012   Procedure: ESOPHAGEAL BANDING;  Surgeon: [name], MD;  [facility];  Service: Endoscopy;  Laterality: N/A;  . Esophageal BANDING  08/03/2012   [facility] [place] , [place]  09/24/2012   Procedure: ESOPHAGEAL BANDING;  Surgeon: [name], MD;  [facility];  Service: Endoscopy;  Laterality: N/A;  Banding x 3  . ESOPHAGEAL BANDING N/A 01/29/2013   Procedure: ESOPHAGEAL BANDING;  Surgeon: [name], MD;  [facility];  Service: Endoscopy;  Laterality: N/A;  . ESOPHAGEAL BANDING N/A 06/19/2014   Procedure: ESOPHAGEAL BANDING;  Surgeon: [name], MD;  [facility];  Service: Endoscopy;  Laterality: N/A;  . ESOPHAGEAL BANDING N/A 01/05/2016   Procedure: ESOPHAGEAL BANDING;  Surgeon: [name], MD;  [facility];  Service: Endoscopy;  Laterality: N/A;  . ESOPHAGEAL BANDING N/A 08/03/2016   Procedure: ESOPHAGEAL BANDING;  Surgeon: [name], MD;  [facility];  Service: Endoscopy;  Laterality: N/A;  . ESOPHAGEAL BANDING N/A 05/02/2017   Procedure: ESOPHAGEAL BANDING;  Surgeon: [name], MD;  [facility];  Service: Endoscopy;  Laterality: N/A;  . ESOPHAGOGASTRODUODENOSCOPY  04/24/2012   Procedure:  ESOPHAGOGASTRODUODENOSCOPY (EGD);  Surgeon: [name], MD;  [facility];  Service: Endoscopy;  Laterality: N/A;  300  . ESOPHAGOGASTRODUODENOSCOPY N/A 01/29/2013   Procedure: ESOPHAGOGASTRODUODENOSCOPY (EGD);  Surgeon: [name], MD;  [facility];  Service: Endoscopy;  Laterality: N/A;  1200  . ESOPHAGOGASTRODUODENOSCOPY N/A 05/22/2013   Procedure: ESOPHAGOGASTRODUODENOSCOPY (EGD);  Surgeon: [name], MD;  [facility];  Service: Endoscopy;  Laterality: N/A;  1:55  . ESOPHAGOGASTRODUODENOSCOPY N/A 12/31/2013   Procedure: ESOPHAGOGASTRODUODENOSCOPY (EGD);  Surgeon: [name], MD;  [facility];  Service: Endoscopy;  Laterality: N/A;  1200  . ESOPHAGOGASTRODUODENOSCOPY N/A 06/19/2014   Procedure: ESOPHAGOGASTRODUODENOSCOPY (EGD);  Surgeon: [name], MD;  [facility];  Service: Endoscopy;  Laterality: N/A;  1055  . ESOPHAGOGASTRODUODENOSCOPY N/A 01/15/2015   Procedure: ESOPHAGOGASTRODUODENOSCOPY (EGD);  Surgeon: [name], MD;  [facility];  Service: Endoscopy;  Laterality: N/A;  730 - moved to 9:45 - moved to 1250-Ann notified pt  . ESOPHAGOGASTRODUODENOSCOPY N/A 06/30/2015   Procedure: ESOPHAGOGASTRODUODENOSCOPY (EGD);  Surgeon: [name], MD;  [facility];  Service: Endoscopy;  Laterality: N/A;  1200  . ESOPHAGOGASTRODUODENOSCOPY N/A 01/05/2016   Procedure: ESOPHAGOGASTRODUODENOSCOPY (EGD);  Surgeon: [name], MD;  [facility];  Service: Endoscopy;  Laterality: N/A;  830  . ESOPHAGOGASTRODUODENOSCOPY N/A 08/03/2016   Procedure: ESOPHAGOGASTRODUODENOSCOPY (EGD);  Surgeon: [name], MD;  [facility];  Service: Endoscopy;  Laterality: N/A;  930  . ESOPHAGOGASTRODUODENOSCOPY N/A 05/02/2017   Procedure: ESOPHAGOGASTRODUODENOSCOPY (EGD);  Surgeon: [name], MD;  [facility];  Service: Endoscopy;  Laterality: N/A;  12:00  .  ESOPHAGOGASTRODUODENOSCOPY N/A 08/17/2017   Procedure: ESOPHAGOGASTRODUODENOSCOPY (EGD);  Surgeon: [name], MD;  [facility];  Service: Endoscopy;  Laterality: N/A;  8:15  . ESOPHAGOGASTRODUODENOSCOPY N/A 09/12/2017   Procedure: ESOPHAGOGASTRODUODENOSCOPY (EGD);  Surgeon: [name], MD;  [facility];  Service: Endoscopy;  Laterality: N/A;  1040  . ESOPHAGOGASTRODUODENOSCOPY N/A 10/10/2017   Procedure: ESOPHAGOGASTRODUODENOSCOPY (EGD);  Surgeon: [name], MD;  [facility];  Service: Endoscopy;  Laterality: N/A;  225  . ESOPHAGOGASTRODUODENOSCOPY N/A 11/09/2017   Procedure: ESOPHAGOGASTRODUODENOSCOPY (EGD);  Surgeon: [name], MD;  [facility];  Service: Endoscopy;  Laterality: N/A;  . ESOPHAGOGASTRODUODENOSCOPY (EGD) WITH PROPOFOL  09/24/2012   Procedure: ESOPHAGOGASTRODUODENOSCOPY (EGD) WITH PROPOFOL;  Surgeon: [name], MD;  [facility];  Service: Endoscopy;  Laterality: N/A;  GE junction at 36  . ESOPHAGOGASTRODUODENOSCOPY (EGD) WITH PROPOFOL N/A 07/06/2017   Procedure: ESOPHAGOGASTRODUODENOSCOPY (EGD) WITH PROPOFOL;  Surgeon: [name], MD;  [facility];  Service: Endoscopy;  Laterality: N/A;  . ESOPHAGOGASTRODUODENOSCOPY W/ BANDING  08/2010  . GIVENS CAPSULE STUDY N/A 07/05/2017   Procedure: GIVENS CAPSULE STUDY;  Surgeon: [name], MD;  [facility];  Service: Endoscopy;  Laterality: N/A;  . HOT HEMOSTASIS N/A 08/17/2017   Procedure: HOT HEMOSTASIS (ARGON PLASMA  COAGULATION/BICAP);  Surgeon: [name], MD;  [facility];  Service: Endoscopy;  Laterality: N/A;  . HOT HEMOSTASIS  09/12/2017   Procedure: HOT HEMOSTASIS (ARGON PLASMA COAGULATION/BICAP);  Surgeon: [name], MD;  [facility];  Service: Endoscopy;;  gastric  . HOT HEMOSTASIS  10/10/2017   Procedure: HOT HEMOSTASIS (ARGON PLASMA COAGULATION/BICAP);  Surgeon: [name], MD;  [facility];  Service: Endoscopy;;  . POLYPECTOMY  03/24/2016   Procedure: POLYPECTOMY;  Surgeon: [name], MD;  [facility];  Service: Endoscopy;;  sigmoid polyp  . RIGHT HEART CATH N/A 04/16/2017   Procedure: Right Heart Cath;  Surgeon: [name], MD;  Location: [facility];  Service: Cardiovascular;  Laterality: N/A;  . RIGHT HEART CATH N/A 07/12/2017   Procedure: RIGHT HEART CATH;  Surgeon: [name], MD;  Location: [facility];  Service: Cardiovascular;  Laterality: N/A;  . TONSILLECTOMY    . UPPER GASTROINTESTINAL ENDOSCOPY  03/15/2011   [facility] ED BANDING/TCS  . UPPER GASTROINTESTINAL ENDOSCOPY  09/05/2010  . UPPER GASTROINTESTINAL ENDOSCOPY  08/11/2010  . VAGINAL HYSTERECTOMY       reports that she has never smoked. She has never used smokeless tobacco. She reports that she does not drink alcohol or use drugs.  Allergies  Allergen Reactions  . Ferumoxytol Other (See Comments)    Patient has severe back and chest pain when receiving FERAHEME infusions. Patient has severe back and chest pain when receiving FERAHEME infusions.  . Tramadol Hcl Other (See Comments)    WEAKNESS WEAKNESS    Family History  Problem Relation Age of Onset  . Lung cancer Mother   . Diabetes Father   . Diabetes Sister   . Hypertension Sister   . Hypothyroidism Sister   . Colon cancer Brother   . Diabetes Sister   . Hypothyroidism Brother   . Healthy Daughter   . Obesity Daughter   . Hypertension Daughter    Prior to Admission medications   Medication Sig Start Date End Date Taking? Authorizing Provider  busPIRone (BUSPAR) 5 MG tablet Take 10 mg by mouth 2 (two) times daily.     [provider]  cholecalciferol (VITAMIN D) 1000 UNITS tablet Take 1,000 Units by mouth 3 (three) times a week.     [provider]  HUMALOG 100 UNIT/ML injection Inject 12-20 Units into the skin 3 (three) times daily with meals.  07/01/18    [provider]  Insulin Glargine (LANTUS SOLOSTAR) 100 UNIT/ML Solostar Pen Inject 40 Units into the skin daily at 10 pm. Patient taking differently: Inject 45 Units into the skin every morning.  07/31/18   Barton Dubois, MD  Insulin Pen Needle 31G X 5 MM MISC Use with each insulin injection 07/22/17   [name], [name], MD  lactulose (Chilili) 10 GM/15ML solution TAKE 30 MLS BY MOUTH FOUR TIMES DAILY Patient taking differently: Take 20 g by mouth 4 (four) times daily.  01/07/18   Rehman, Mechele Dawley, MD  levothyroxine (SYNTHROID, LEVOTHROID) 25 MCG tablet Take 25 mcg by mouth daily before breakfast.     [provider]  magnesium oxide (MAG-OX) 400 MG tablet Take 400 mg by mouth daily.     [provider]  multivitamin (VIT W/EXTRA C) CHEW chewable tablet Chew 1 tablet by mouth daily.    [provider]  OXYGEN Inhale 2 L into the lungs at bedtime.  [provider]  pantoprazole (PROTONIX) 40 MG tablet Take 1 tablet (40 mg total) by mouth 2 (two) times daily. 10/09/18   Rehman, Mechele Dawley, MD  potassium chloride (K-DUR) 10 MEQ tablet Take 1 tablet (10 mEq total) by mouth 2 (two) times daily. 11/07/18   [name], MD  rifaximin (XIFAXAN) 550 MG TABS tablet Take 1 tablet (550 mg total) by mouth 3 (three) times daily. Patient taking differently: Take 550 mg by mouth 2 (two) times daily.  07/31/18   Barton Dubois, MD  rosuvastatin (CRESTOR) 5 MG tablet Take 1 tablet (5 mg total) by mouth every other day. Patient not taking: Reported on 01/27/2019 06/27/17 10/09/18  [name], MD  sertraline (ZOLOFT) 50 MG tablet Take 50 mg by mouth daily.    [provider]  spironolactone (ALDACTONE) 50 MG tablet TAKE 1 TABLET BY MOUTH TWICE DAILY 09/16/18   Rehman, Mechele Dawley, MD  torsemide (DEMADEX) 20 MG tablet Take 1 tablet (20 mg total) by mouth daily. Please call for OV [phone] 02/03/19   [name], MD  trimethoprim-polymyxin b  Mayra Neer) ophthalmic solution Place 1 drop into the left eye See admin instructions. Use 3 to 4 times daily for 2 days following monthly eye injection 05/06/18   [provider]  zinc sulfate 220 (50 Zn) MG capsule Take 1 capsule (220 mg total) by mouth daily. 08/01/18   Barton Dubois, MD    Physical Exam: Vitals:   02/27/19 0000 02/27/19 0100 02/27/19 0300 02/27/19 0330  BP:  114/87    Pulse:   82 79  Resp:  19 (!) 21 20  Temp:      TempSrc:      SpO2: 99% 99%    Weight:      Height:        Constitutional: NAD, calm, comfortable Eyes: PERRL, lids and conjunctivae normal ENMT: Mucous membranes are moist. Posterior pharynx clear of any exudate or lesions. Neck: normal, supple, no masses, no thyromegaly Respiratory: Decreased breath sounds on bases, otherwise clear to auscultation bilaterally, no wheezing, no crackles. Normal respiratory effort. No accessory muscle use.  Cardiovascular: Regular rate and rhythm, no murmurs / rubs / gallops.  1+ lower extremities pitting edema. 2+ pedal pulses. No carotid bruits.  Abdomen: Obese, soft, no tenderness, no masses palpated. No hepatosplenomegaly. Bowel sounds positive.  Musculoskeletal: no clubbing / cyanosis. Good ROM, no contractures. Normal muscle tone.  Skin: Scattered small areas of ecchymosis on very limited dermatological examination. Neurologic: CN 2-12 grossly intact. Sensation intact, DTR normal.  Generalized weakness. Psychiatric: Somnolent.  Wakes up briefly and is oriented to name.  Labs on Admission: I have personally reviewed following labs and imaging studies  CBC: Recent Labs  Lab 02/24/19 1145 02/25/19 0339 02/25/19 0808 02/25/19 1329 02/27/19 0006  WBC 3.3* 4.7  --   --  7.0  NEUTROABS  --   --   --   --  4.7  HGB 4.8* 6.5* 6.7* 7.9* 7.4*  HCT 17.6* 22.3* 21.9* 25.0* 23.6*  MCV 91.2 91.4  --   --  87.1  PLT 65* 50*  --   --  50*   Basic Metabolic Panel: Recent Labs  Lab 02/24/19 1145 02/25/19  0339 02/26/19 2314  NA 140 139 141  K 4.2 4.1 3.8  CL 112* 114* 113*  CO2 20* 18* 22  GLUCOSE 230* 168* 132*  BUN 18 21 26*  CREATININE 0.93 0.95 0.99  CALCIUM 8.7* 8.0* 8.2*  GFR: Estimated Creatinine Clearance: 65.8 mL/min (by C-G formula based on SCr of 0.99 mg/dL). Liver Function Tests: Recent Labs  Lab 02/26/19 2314  AST 27  ALT 24  ALKPHOS 74  BILITOT 3.0*  PROT 4.7*  ALBUMIN 2.3*   No results for input(s): LIPASE, AMYLASE in the last 168 hours. Recent Labs  Lab 02/24/19 1145 02/26/19 2314  AMMONIA 57* 85*   Coagulation Profile: Recent Labs  Lab 02/26/19 2314  INR 1.6*   Cardiac Enzymes: No results for input(s): CKTOTAL, CKMB, CKMBINDEX, TROPONINI in the last 168 hours. BNP (last 3 results) No results for input(s): PROBNP in the last 8760 hours. HbA1C: No results for input(s): HGBA1C in the last 72 hours. CBG: Recent Labs  Lab 02/24/19 1607 02/24/19 2112 02/25/19 0727 02/25/19 1112  GLUCAP 193* 74 116* 207*   Lipid Profile: No results for input(s): CHOL, HDL, LDLCALC, TRIG, CHOLHDL, LDLDIRECT in the last 72 hours. Thyroid Function Tests: No results for input(s): TSH, T4TOTAL, FREET4, T3FREE, THYROIDAB in the last 72 hours. Anemia Panel: No results for input(s): VITAMINB12, FOLATE, FERRITIN, TIBC, IRON, RETICCTPCT in the last 72 hours. Urine analysis:    Component Value Date/Time   COLORURINE YELLOW 02/27/2019 0026   APPEARANCEUR CLEAR 02/27/2019 0026   LABSPEC 1.017 02/27/2019 0026   PHURINE 8.0 02/27/2019 0026   GLUCOSEU NEGATIVE 02/27/2019 0026   HGBUR NEGATIVE 02/27/2019 0026   BILIRUBINUR NEGATIVE 02/27/2019 0026   KETONESUR NEGATIVE 02/27/2019 0026   PROTEINUR NEGATIVE 02/27/2019 0026   NITRITE POSITIVE (A) 02/27/2019 0026   LEUKOCYTESUR NEGATIVE 02/27/2019 0026    Radiological Exams on Admission: Ct Head Wo Contrast  Result Date: 02/27/2019 CLINICAL DATA:  Altered mental status EXAM: CT HEAD WITHOUT CONTRAST TECHNIQUE:  Contiguous axial images were obtained from the base of the skull through the vertex without intravenous contrast. COMPARISON:  07/18/2017 FINDINGS: Brain: No evidence of acute infarction, hemorrhage, hydrocephalus, extra-axial collection or mass lesion/mass effect. Age related volume loss is noted Vascular: No hyperdense vessel or unexpected calcification. Skull: Normal. Negative for fracture or focal lesion. Sinuses/Orbits: There is opacification of the left maxillary sinus and left ethmoid air cells, likely chronic. There is mucosal thickening of the frontal sinuses. The remaining paranasal sinuses and mastoid air cells are essentially clear. Other: None. IMPRESSION: No acute intracranial abnormality. Electronically Signed   By: Constance Holster M.D.   On: 02/27/2019 00:07    EKG: Independently reviewed. Vent. rate 86 BPM PR interval * ms QRS duration 104 ms QT/QTc 389/466 ms P-R-T axes 44 -58 46 Sinus rhythm Prolonged PR interval Left anterior fascicular block Consider anterior infarct  Assessment/Plan Principal Problem:   Altered mental status   Acute hepatic encephalopathy Observation/telemetry. Continue supplemental oxygen. Continue lactulose 30 grams po TID. Follow-up ammonia level. Staff to provide further cirrhosis diet information.  Active Problems:   Bacteriuria versus UTI The patient received ceftriaxone in the emergency department. I will hold pending results from urine culture and sensitivity.. If suspicion is high, will resume ceftriaxone later this evening.    Cirrhosis, nonalcoholic (HCC) MELD score was 16 with a mortality rate of 6% 90 days. Child Pugh score of 9. Continue treatment as above. Otherwise, no other signs of decompensation. Continue diuretics. Follow[facility] with [facility] and locally with Dr. Laural Golden.    Chronic diastolic heart failure (HCC) Continue diuretics.    Hypothyroidism Continue levothyroxine.    Anemia H&H. Transfuse as needed.     Thrombocytopenia (HCC) Monitor platelet count.    DVT prophylaxis: SCDs.  Code Status: Full code. Family Communication: Disposition Plan: Observation for hepatic encephalopathy treatment and follow-up hemoglobin. Consults called: Admission status: Observation/telemetry.   Reubin Milan MD [facility]  02/27/2019, 3:46 AM   This document was prepared using Dragon voice recognition software and may contain some unintended transcription errors.

## 2019-02-27 NOTE — Consult Note (Signed)
Referring Provider: Barton Dubois, MD Primary Care Physician:  Glenda Chroman, MD Primary Gastroenterologist:  Dr. Laural Golden  Reason for Consultation:    Hepatic encephalopathy and anemia  HPI:   Patient is 63 year old Caucasian female who has cirrhosis secondary to NASH complicated by esophageal variceal bleed(2011 and 2013) as well as hepatic encephalopathy and gastric antral vascular ectasia status post multiple APC sessions who is requiring frequent blood transfusion on an outpatient basis while waiting for transplant evaluation to be completed at [facility].  Patient underwent transhepatic portosystemic shunt in October 2019 along with successful embolization of 2 of the outflow veins from a large mesocaval shunt in October 2019. Patient had a unit of PRBCs on 02/18/2019.  She had routine blood work on 02/24/2019 and her H&H was 4.8 and 17.6.  Her platelet count was 65K and WBC was 3.3.  She was briefly hospitalized.  She received 3 units of PRBCs. Posttransfusion hemoglobin on 02/25/2019 was 7.9. Patient's husband Ronalee Belts found her on the floor at 6 AM yesterday.  She was confused.  He was not able to get her back to bed or stand up.  He therefore called EMS.  Staff was able to get her up and she was able to walk.  She was not taken to the emergency room.  She dozed off intermittently rest of the morning.  She did eat some before breakfast and lunch.  She was able to take her pills but did not drink GoLYTELY.  Around 1 PM he for him forever to get to the bathroom to void.  Her to lab at [facility] for COVID test in preparation for EGD at [facility] on 02/28/2019.  Around 6 PM yesterday he noted her to be confused.  She was just staring and unable to answer any questions.  Patient was therefore brought to emergency room for evaluation.  He states she did not have any bowel movement all day yesterday. Evaluation in emergency room reveals elevated serum ammonia and hemoglobin of 7.4 g.  Unenhanced head CT  was unremarkable. Patient was admitted to hospitalist service for further management. Patient has been able to swallow her medications including lactulose. There is no history of nausea vomiting melena or rectal bleeding abdominal pain or fever according to her husband. While patient is drowsy she is able to answer questions.  She denies abdominal pain nausea vomiting chest pain or shortness of breath.   Past Medical History:  Diagnosis Date  . Anemia   . CHF (congestive heart failure) ([place])   . Cirrhosis ([place])   . Depression   . Diabetes mellitus   . Esophageal bleed, non-variceal   . Eye hemorrhage    Behind left eye  . Fibromyalgia   . Hypercholesteremia   . Hypertension   . Hypothyroidism   . NAFLD (nonalcoholic fatty liver disease)   . Osteoarthrosis   . Osteoporosis   . PONV (postoperative nausea and vomiting)   . UTI (urinary tract infection) 11/12 end of the month   Patient feels that she passed a kidney stone at that time    Past Surgical History:  Procedure Laterality Date  . APPENDECTOMY  1980  . BILATERAL SALPINGOOPHORECTOMY    . CHOLECYSTECTOMY    . COLONOSCOPY  03/15/2011  . COLONOSCOPY N/A 03/24/2016   Procedure: COLONOSCOPY;  Surgeon: [name], MD;  [facility];  Service: Endoscopy;  Laterality: N/A;  855  . ESOPHAGEAL BANDING  04/24/2012   Procedure: ESOPHAGEAL BANDING;  Surgeon: [name], MD;  [facility];  Service: Endoscopy;  Laterality: N/A;  . Esophageal BANDING  08/03/2012   [facility] [place] [facility] [place]  09/24/2012   Procedure: ESOPHAGEAL BANDING;  Surgeon: [name], MD;  Location: [facility] ORS;  Service: Endoscopy;  Laterality: N/A;  Banding x 3  . ESOPHAGEAL BANDING N/A 01/29/2013   Procedure: ESOPHAGEAL BANDING;  Surgeon: [name], MD;  [facility];  Service: Endoscopy;  Laterality: N/A;  . ESOPHAGEAL BANDING N/A 06/19/2014   Procedure: ESOPHAGEAL BANDING;   Surgeon: [name], MD;  [facility];  Service: Endoscopy;  Laterality: N/A;  . ESOPHAGEAL BANDING N/A 01/05/2016   Procedure: ESOPHAGEAL BANDING;  Surgeon: [name], MD;  [facility];  Service: Endoscopy;  Laterality: N/A;  . ESOPHAGEAL BANDING N/A 08/03/2016   Procedure: ESOPHAGEAL BANDING;  Surgeon: [name], MD;  [facility];  Service: Endoscopy;  Laterality: N/A;  . ESOPHAGEAL BANDING N/A 05/02/2017   Procedure: ESOPHAGEAL BANDING;  Surgeon: [name], MD;  [facility];  Service: Endoscopy;  Laterality: N/A;  . ESOPHAGOGASTRODUODENOSCOPY  04/24/2012   Procedure: ESOPHAGOGASTRODUODENOSCOPY (EGD);  Surgeon: [name], MD;  [facility];  Service: Endoscopy;  Laterality: N/A;  300  . ESOPHAGOGASTRODUODENOSCOPY N/A 01/29/2013   Procedure: ESOPHAGOGASTRODUODENOSCOPY (EGD);  Surgeon: [name], MD;  [facility];  Service: Endoscopy;  Laterality: N/A;  1200  . ESOPHAGOGASTRODUODENOSCOPY N/A 05/22/2013   Procedure: ESOPHAGOGASTRODUODENOSCOPY (EGD);  Surgeon: [name], MD;  [facility];  Service: Endoscopy;  Laterality: N/A;  1:55  . ESOPHAGOGASTRODUODENOSCOPY N/A 12/31/2013   Procedure: ESOPHAGOGASTRODUODENOSCOPY (EGD);  Surgeon: [name], MD;  [facility];  Service: Endoscopy;  Laterality: N/A;  1200  . ESOPHAGOGASTRODUODENOSCOPY N/A 06/19/2014   Procedure: ESOPHAGOGASTRODUODENOSCOPY (EGD);  Surgeon: [name], MD;  [facility];  Service: Endoscopy;  Laterality: N/A;  1055  . ESOPHAGOGASTRODUODENOSCOPY N/A 01/15/2015   Procedure: ESOPHAGOGASTRODUODENOSCOPY (EGD);  Surgeon: [name], MD;  [facility];  Service: Endoscopy;  Laterality: N/A;  730 - moved to 9:45 - moved to 1250-Ann notified pt  . ESOPHAGOGASTRODUODENOSCOPY N/A 06/30/2015   Procedure: ESOPHAGOGASTRODUODENOSCOPY (EGD);  Surgeon: [name], MD;  [facility];  Service: Endoscopy;  Laterality: N/A;  1200  . ESOPHAGOGASTRODUODENOSCOPY N/A 01/05/2016   Procedure: ESOPHAGOGASTRODUODENOSCOPY (EGD);  Surgeon: [name], MD;  [facility];  Service: Endoscopy;  Laterality: N/A;  830  . ESOPHAGOGASTRODUODENOSCOPY N/A 08/03/2016   Procedure: ESOPHAGOGASTRODUODENOSCOPY (EGD);  Surgeon: [name], MD;  [facility];  Service: Endoscopy;  Laterality: N/A;  930  . ESOPHAGOGASTRODUODENOSCOPY N/A 05/02/2017   Procedure: ESOPHAGOGASTRODUODENOSCOPY (EGD);  Surgeon: [name], MD;  [facility];  Service: Endoscopy;  Laterality: N/A;  12:00  . ESOPHAGOGASTRODUODENOSCOPY N/A 08/17/2017   Procedure: ESOPHAGOGASTRODUODENOSCOPY (EGD);  Surgeon: [name], MD;  [facility];  Service: Endoscopy;  Laterality: N/A;  8:15  . ESOPHAGOGASTRODUODENOSCOPY N/A 09/12/2017   Procedure: ESOPHAGOGASTRODUODENOSCOPY (EGD);  Surgeon: [name], MD;  [facility];  Service: Endoscopy;  Laterality: N/A;  1040  . ESOPHAGOGASTRODUODENOSCOPY N/A 10/10/2017   Procedure: ESOPHAGOGASTRODUODENOSCOPY (EGD);  Surgeon: [name], MD;  [facility];  Service: Endoscopy;  Laterality: N/A;  225  . ESOPHAGOGASTRODUODENOSCOPY N/A 11/09/2017   Procedure:  ESOPHAGOGASTRODUODENOSCOPY (EGD);  Surgeon: [name], MD;  [facility];  Service: Endoscopy;  Laterality: N/A;  . ESOPHAGOGASTRODUODENOSCOPY (EGD) WITH PROPOFOL  09/24/2012   Procedure: ESOPHAGOGASTRODUODENOSCOPY (EGD) WITH PROPOFOL;  Surgeon: [name], MD;  Location: [facility];  Service: Endoscopy;  Laterality: N/A;  GE junction at 36  . ESOPHAGOGASTRODUODENOSCOPY (EGD) WITH PROPOFOL N/A 07/06/2017   Procedure: ESOPHAGOGASTRODUODENOSCOPY (EGD) WITH PROPOFOL;  Surgeon: [name], MD;  [facility];  Service: Endoscopy;  Laterality: N/A;  . ESOPHAGOGASTRODUODENOSCOPY W/ BANDING  08/2010  . GIVENS CAPSULE  STUDY N/A 07/05/2017   Procedure: GIVENS CAPSULE STUDY;  Surgeon: [name], MD;  [facility];  Service: Endoscopy;  Laterality: N/A;  . HOT HEMOSTASIS N/A 08/17/2017   Procedure: HOT HEMOSTASIS (ARGON PLASMA COAGULATION/BICAP);  Surgeon: [name], MD;  [facility];  Service: Endoscopy;  Laterality: N/A;  . HOT HEMOSTASIS  09/12/2017   Procedure: HOT HEMOSTASIS (ARGON PLASMA COAGULATION/BICAP);  Surgeon: [name], MD;  [facility];  Service: Endoscopy;;  gastric  . HOT HEMOSTASIS  10/10/2017   Procedure: HOT HEMOSTASIS (ARGON PLASMA COAGULATION/BICAP);  Surgeon: [name], MD;  [facility];  Service: Endoscopy;;  . POLYPECTOMY  03/24/2016   Procedure: POLYPECTOMY;  Surgeon: [name], MD;  [facility];  Service: Endoscopy;;  sigmoid polyp  . RIGHT HEART CATH N/A 04/16/2017   Procedure: Right Heart Cath;  Surgeon: [name], MD;  Location: [place] CV LAB;  Service: Cardiovascular;  Laterality: N/A;  . RIGHT HEART CATH N/A 07/12/2017   Procedure: RIGHT HEART CATH;  Surgeon: [name], MD;  Location: [place] CV LAB;  Service: Cardiovascular;  Laterality: N/A;  . TONSILLECTOMY    . UPPER GASTROINTESTINAL ENDOSCOPY  03/15/2011   EGD ED BANDING/TCS  . UPPER GASTROINTESTINAL ENDOSCOPY  09/05/2010  . UPPER GASTROINTESTINAL ENDOSCOPY  08/11/2010  . VAGINAL HYSTERECTOMY      Prior to Admission medications   Medication Sig Start Date End Date Taking? Authorizing Provider  busPIRone (BUSPAR) 5 MG tablet Take 10 mg by mouth 2 (two) times daily.    Yes [provider]  cholecalciferol (VITAMIN D) 1000 UNITS tablet Take 1,000 Units by mouth 3 (three) times a week.    Yes [provider]  HUMALOG 100 UNIT/ML injection Inject 12-20 Units into the skin 3 (three) times daily with meals.  07/01/18  Yes [provider]  Insulin Glargine (LANTUS SOLOSTAR) 100 UNIT/ML  Solostar Pen Inject 40 Units into the skin daily at 10 pm. Patient taking differently: Inject 45 Units into the skin every morning.  07/31/18  Yes Barton Dubois, MD  Insulin Pen Needle 31G X 5 MM MISC Use with each insulin injection 07/22/17  Yes [name], [name], MD  lactulose (CHRONULAC) 10 GM/15ML solution TAKE 30 MLS BY MOUTH FOUR TIMES DAILY Patient taking differently: Take 20 g by mouth 4 (four) times daily.  01/07/18  Yes Jamile Rekowski, Mechele Dawley, MD  levothyroxine (SYNTHROID, LEVOTHROID) 25 MCG tablet Take 25 mcg by mouth daily before breakfast.    Yes [provider]  magnesium oxide (MAG-OX) 400 MG tablet Take 400 mg by mouth daily.    Yes [provider]  multivitamin (VIT W/EXTRA C) CHEW chewable tablet Chew 1 tablet by mouth daily.   Yes [provider]  OXYGEN Inhale 2 L into the lungs at bedtime.    Yes [provider]  pantoprazole (PROTONIX) 40 MG tablet Take 1 tablet (40 mg total) by mouth 2 (two) times daily. 10/09/18  Yes Marybeth Dandy, Mechele Dawley, MD  potassium chloride (K-DUR) 10 MEQ tablet Take 1 tablet (10 mEq total) by mouth 2 (two) times daily. 11/07/18  Yes Anchor Dwan, Mechele Dawley, MD  rifaximin (XIFAXAN) 550 MG TABS tablet Take 1 tablet (550 mg total) by mouth 3 (three) times daily. Patient taking differently: Take 550 mg by mouth 2 (two) times daily.  07/31/18  Yes Barton Dubois, MD  sertraline (ZOLOFT) 50 MG tablet Take 50 mg by mouth daily.   Yes [provider]  spironolactone (ALDACTONE) 50 MG tablet TAKE 1 TABLET BY MOUTH TWICE DAILY 09/16/18  Yes Anayi Bricco, Mechele Dawley, MD  torsemide (DEMADEX) 20 MG tablet Take 1 tablet (20 mg total) by mouth daily. Please call for OV [phone] 02/03/19  Yes [name], MD  zinc sulfate 220 (50 Zn) MG capsule Take 1 capsule (220 mg total) by mouth daily. 08/01/18  Yes Barton Dubois, MD  trimethoprim-polymyxin b (POLYTRIM) ophthalmic solution Place 1 drop into the left eye See admin instructions. Use  3 to 4 times daily for 2 days following monthly eye injection 05/06/18   [provider]    Current Facility-Administered Medications  Medication Dose Route Frequency Provider Last Rate Last Dose  . 0.45 % sodium chloride infusion   Intravenous Continuous Alyssha Housh U, MD      . busPIRone (BUSPAR) tablet 10 mg  10 mg Oral BID Barton Dubois, MD   10 mg at 02/27/19 1118  . [START ON 02/28/2019] cholecalciferol (VITAMIN D3) tablet 1,000 Units  1,000 Units Oral 3 times weekly Barton Dubois, MD      . insulin aspart (novoLOG) injection 0-9 Units  0-9 Units Subcutaneous TID WC Barton Dubois, MD      . insulin glargine (LANTUS) injection 25 Units  25 Units Subcutaneous Q2200 Barton Dubois, MD      . lactulose ([place]) 10 GM/15ML solution 30 g  30 g Oral TID Reubin Milan, MD   30 g at 02/27/19 1117  . [START ON 02/28/2019] levothyroxine (SYNTHROID) tablet 25 mcg  25 mcg Oral QAC breakfast Barton Dubois, MD      . magnesium oxide (MAG-OX) tablet 400 mg  400 mg Oral Daily Barton Dubois, MD   400 mg at 02/27/19 1119  . multivitamin with minerals tablet 1 tablet  1 tablet Oral Daily Barton Dubois, MD   1 tablet at 02/27/19 1119  . ondansetron (ZOFRAN) tablet 4 mg  4 mg Oral Q6H PRN Reubin Milan, MD       Or  . ondansetron [facility]) injection 4 mg  4 mg Intravenous Q6H PRN Reubin Milan, MD      . pantoprazole (PROTONIX) EC tablet 40 mg  40 mg Oral BID Barton Dubois, MD   40 mg at 02/27/19 1117  . potassium chloride (K-DUR) CR tablet 10 mEq  10 mEq Oral BID Barton Dubois, MD   10 mEq at 02/27/19 1117  . rifaximin (XIFAXAN) tablet 550 mg  550 mg Oral BID Barton Dubois, MD   550 mg at 02/27/19 1118  . sertraline (ZOLOFT) tablet 50 mg  50 mg Oral Daily Barton Dubois, MD   50 mg at 02/27/19 1118  . spironolactone (ALDACTONE) tablet 50 mg  50 mg Oral BID Barton Dubois, MD   50 mg at 02/27/19 1118  . torsemide (DEMADEX) tablet 20 mg  20 mg Oral Daily [place],  Clifton James, MD    20 mg at 02/27/19 1118  . zinc sulfate capsule 220 mg  220 mg Oral Daily Barton Dubois, MD   220 mg at 02/27/19 1119    Allergies as of 02/26/2019 - Review Complete 02/26/2019  Allergen Reaction Noted  . Ferumoxytol Other (See Comments) 07/08/2017  . Tramadol hcl Other (See Comments) 07/21/2017    Family History  Problem Relation Age of Onset  . Lung cancer Mother   . Diabetes Father   . Diabetes Sister   . Hypertension Sister   . Hypothyroidism Sister   . Colon cancer Brother   . Diabetes Sister   . Hypothyroidism Brother   . Healthy Daughter   . Obesity Daughter   . Hypertension Daughter     Social History   Socioeconomic History  . Marital status: Married    Spouse name: Not on file  . Number of children: Not on file  . Years of education: Not on file  . Highest education level: Not on file  Occupational History  . Occupation: retired   Scientific laboratory technician  . Financial resource strain: Not very hard  . Food insecurity    Worry: Never true    Inability: Never true  . Transportation needs    Medical: No    Non-medical: No  Tobacco Use  . Smoking status: Never Smoker  . Smokeless tobacco: Never Used  Substance and Sexual Activity  . Alcohol use: No    Alcohol/week: 0.0 standard drinks  . Drug use: No  . Sexual activity: Not on file  Lifestyle  . Physical activity    Days per week: 0 days    Minutes per session: 0 min  . Stress: Very much  Relationships  . Social Herbalist on phone: Not on file    Gets together: Not on file    Attends religious service: Not on file    Active member of club or organization: Not on file    Attends meetings of clubs or organizations: Not on file    Relationship status: Not on file  . Intimate partner violence    Fear of current or ex partner: Not on file    Emotionally abused: Not on file    Physically abused: Not on file    Forced sexual activity: Not on file  Other Topics Concern  . Not on file  Social  History Narrative   Lives with husband     Review of Systems: See HPI, otherwise normal ROS  Physical Exam: Temp:  [98.7 F (37.1 C)-99.3 F (37.4 C)] 98.7 F (37.1 C) (06/11 0438) Pulse Rate:  [75-89] 84 (06/11 0438) Resp:  [16-26] 16 (06/11 0438) BP: (114-130)/(43-87) 130/51 (06/11 0438) SpO2:  [98 %-100 %] 100 % (06/11 0438) Weight:  [94.3 kg-97.1 kg] 94.3 kg (06/11 0438) Last BM Date: (unable to answer)  Patient is drowsy and responds appropriately to simple questions such as extending her arms or making a fist. She is oriented to place but not to time or person. Asterixis present. Conjunctiva is pale.  Sclerae nonicteric. Oropharyngeal mucosa is dry. No neck masses or thyromegaly noted. Cardiac exam with regular rhythm normal S1 and S2.  She has faint systolic murmur best heard at aortic area. Auscultation of lungs reveal basilar breath sounds bilaterally. Abdomen is symmetrical.  Bowel sounds are normal.  On palpation abdomen is soft and nontender.  Spleen is not palpable.  Liver edge is indistinct. She does not have peripheral  edema or clubbing.     Lab Results: Recent Labs    02/25/19 0339  02/25/19 1329 02/27/19 0006 02/27/19 0732  WBC 4.7  --   --  7.0 4.7  HGB 6.5*   < > 7.9* 7.4* 6.6*  HCT 22.3*   < > 25.0* 23.6* 21.3*  PLT 50*  --   --  50* 46*   < > = values in this interval not displayed.   BMET Recent Labs    02/25/19 0339 02/26/19 2314 02/27/19 0732  NA 139 141 142  K 4.1 3.8 3.6  CL 114* 113* 113*  CO2 18* 22 22  GLUCOSE 168* 132* 112*  BUN 21 26* 24*  CREATININE 0.95 0.99 0.98  CALCIUM 8.0* 8.2* 8.2*   LFT Recent Labs    02/26/19 2314  PROT 4.7*  ALBUMIN 2.3*  AST 27  ALT 24  ALKPHOS 74  BILITOT 3.0*   PT/INR Recent Labs    02/26/19 2314  LABPROT 19.3*  INR 1.6*    Studies/Results: Ct Head Wo Contrast  Result Date: 02/27/2019 CLINICAL DATA:  Altered mental status EXAM: CT HEAD WITHOUT CONTRAST TECHNIQUE: Contiguous  axial images were obtained from the base of the skull through the vertex without intravenous contrast. COMPARISON:  07/18/2017 FINDINGS: Brain: No evidence of acute infarction, hemorrhage, hydrocephalus, extra-axial collection or mass lesion/mass effect. Age related volume loss is noted Vascular: No hyperdense vessel or unexpected calcification. Skull: Normal. Negative for fracture or focal lesion. Sinuses/Orbits: There is opacification of the left maxillary sinus and left ethmoid air cells, likely chronic. There is mucosal thickening of the frontal sinuses. The remaining paranasal sinuses and mastoid air cells are essentially clear. Other: None. IMPRESSION: No acute intracranial abnormality. Electronically Signed   By: Constance Holster M.D.   On: 02/27/2019 00:07    Assessment;  Patient is 63 year old Caucasian female with decompensated cirrhosis secondary to NASH with multiple complications who presents with acute on chronic hepatic encephalopathy.  Hepatic encephalopathy appears to be due to the fact that she was not able to take her lactulose and she may also be dehydrated.  She does not appear to have acute GI bleed.  Therefore I do not believe this is the trigger for her hepatic encephalopathy.  She therefore would benefit from IV fluids for the next 12 to 24 hours until oral intake is sufficient. Patient's meld score based on lab data from last evening is 16. I have talked with Ms. Neysa Bonito patient's transplant coordinator. Dr. Letta Pate has kindly agreed to accept patient in transfer.  Anemia secondary to GI blood loss.  Esophageal varices have been effectively eradicated with banding and she has developed GAVE unresponsive to APC ablation.  She also has a history of mesocaval varices which were partially occluded in October 2019.  I suspect she is losing blood from gastric antral vascular ectasia and she could have portal hypertensive enteropathy as well.  Patient is scheduled to undergo  EGD at [facility] by Dr. Alonna Minium on 02/28/2019 and I hope she improves to the point that she can undergo the study tomorrow in order to expedite her evaluation.  Recommendations;  Begin IV fluids with half-normal saline at a rate of 75 mL/h. Continue lactulose and Xifaxan along with other medications will hold torsemide and spironolactone. Will proceed with transfusion with 1 unit of PRBCs as recommended by physicians at [facility]. Patient will be transferred to [facility] as soon as bed is available so  that she can undergo esophagogastroduodenoscopy in a.m. as planned. Diuretic therapy can be initiated at the time of discharge.   LOS: 0 days   Arnie Clingenpeel  02/27/2019, 12:04 PM

## 2019-02-27 NOTE — Unmapped (Signed)
Medicine Access Physician (MAP): Transfer Center Request Note    Requesting Physician: Dr. Karilyn Cota    Requesting Hospital: [facility]    Requesting Service: Hepatology    Reason for Transfer: Severe GAVE, hepatic encephalopathy    Brief Hospital Course: 63yo F well known to [facility] liver team in the midst of transplant evaluation (deferred) for NAFLD cirrhosis complicated by remote hx of variceal bleeding 2011/2013 with esophageal varix eradication with banding, portal vein thrombosis, GAVE with chronic oozing s/p interventional procedure 10/2018 to address mesenteric vasrices which was not completely successful, IVC filter in place, s/p TIPS 06/2018, hepatic encephalopathy who has a chronic transfusion requirement of 1-2 units every 1-2 weeks (baseline hgb 6-7) presented for standard labs Monday 6/8 and found to have hemoglobin of 4.8. She was admitted overnight and transfused 3 units with hgb 7.9 the following day and no overt bleeding or instablity. Unfortunately, after she went home she became more confused and did not receive lactulose. She represented last night 6/10 with confusion, hemoglobin 7.4, 6.6 this am with improvement in her mental status (knew her GI physician, took her meds/pills). She received 2g ceftriaxone but no clinical ascites on exam. She is due for endoscopy at [facility] tomorrow. The case was discussed with Dr. Ruffin Frederick her [facility] hepatologist and accepted for transfer. I requested they go ahead and transfuse her as there may be a few hours before we can get her in a bed here.     COVID testing was negative 02/26/19    VItals: afebrile, hr 84, bp 130/50 (nl for her).   MELD 16  INR 1.6    COVID19 Concerns: No, ruled out. Test performed at [facility] 02/26/19  [place] DHSS# -or- Lab Carrying Out COVID testing if applicable: n/a    Plan Upon Arrival: GI hepatology consultation, NPO after MN, type and screen (some hx of RBC antibodies)    Bed Type: Floor  Are there any clinical exclusion criteria as of the current date, as defined in the [facility], to providing the patient's clinical care at [facility]? If yes, please indicate the exclusion criteria: YES, GI Hepatology      Accepting Service: Teaching service preferred. MDH, MDU, MDW or Appropriate for medicine services as determined by MAO for regionalization.    Imaging Needed:  No.    [employer] Affiliate:  No.  Reminded facility to provide imaging with transfer paperwork.  (What is this?)    Please check under Care Everywhere and/or External Records in the patient's Media Tab to see if a discharge summary has been previously sent electronically.  Additional helpful tools: [facility] [employer]    Once the patient's quaternary care center evaluation and treatment are completed, the requesting Physician and Hospital HAVE agreed to accept the patient on back transfer if the patient requires ongoing hospitalization that does not require continued quaternary level care at [facility].

## 2019-02-27 NOTE — Unmapped (Signed)
Call received from [facility] spouse - patient remains admitted to OSH with AMS and anemia. Again provided him with contact # for [facility] consultation.    Call received from Dr. Karilyn Cota - confirms patient admitted to OSH and he would like her transferred  to [facility] for further eval. Provided him with contact # for [facility] consultation and made Dr. Evlyn Courier aware.

## 2019-02-27 NOTE — Unmapped (Signed)
Call placed to [facility] regarding a vm that was left for me ysterday.  The patient was unavailable, a vm was left asking her to return my call if she still needed my assistance.

## 2019-02-28 ENCOUNTER — Ambulatory Visit
Admit: 2019-02-28 | Discharge: 2019-03-06 | Disposition: A | Discharge status: Home Health | Payer: PRIVATE HEALTH INSURANCE | Source: Other Acute Inpatient Hospital | Admitting: Internal Medicine

## 2019-02-28 ENCOUNTER — Encounter
Admit: 2019-02-28 | Discharge: 2019-03-06 | Disposition: A | Discharge status: Home Health | Payer: PRIVATE HEALTH INSURANCE | Source: Other Acute Inpatient Hospital | Attending: Certified Registered" | Admitting: Internal Medicine

## 2019-02-28 ENCOUNTER — Encounter
Admit: 2019-02-28 | Discharge: 2019-03-06 | Disposition: A | Discharge status: Home Health | Payer: PRIVATE HEALTH INSURANCE | Source: Other Acute Inpatient Hospital | Attending: Anesthesiology | Admitting: Internal Medicine

## 2019-02-28 ENCOUNTER — Encounter
Admit: 2019-02-28 | Discharge: 2019-03-06 | Disposition: A | Discharge status: Home Health | Payer: PRIVATE HEALTH INSURANCE | Source: Other Acute Inpatient Hospital | Admitting: Internal Medicine

## 2019-02-28 DIAGNOSIS — K7290 Hepatic failure, unspecified without coma: Principal | ICD-10-CM

## 2019-02-28 DIAGNOSIS — K31811 Angiodysplasia of stomach and duodenum with bleeding: Secondary | ICD-10-CM | POA: Diagnosis not present

## 2019-02-28 DIAGNOSIS — K317 Polyp of stomach and duodenum: Secondary | ICD-10-CM | POA: Diagnosis not present

## 2019-02-28 DIAGNOSIS — Z1159 Encounter for screening for other viral diseases: Secondary | ICD-10-CM | POA: Diagnosis not present

## 2019-02-28 DIAGNOSIS — K7469 Other cirrhosis of liver: Secondary | ICD-10-CM | POA: Diagnosis not present

## 2019-02-28 DIAGNOSIS — Z538 Procedure and treatment not carried out for other reasons: Secondary | ICD-10-CM | POA: Diagnosis not present

## 2019-02-28 DIAGNOSIS — J811 Chronic pulmonary edema: Secondary | ICD-10-CM | POA: Diagnosis not present

## 2019-02-28 DIAGNOSIS — I517 Cardiomegaly: Secondary | ICD-10-CM | POA: Diagnosis not present

## 2019-02-28 DIAGNOSIS — K921 Melena: Secondary | ICD-10-CM | POA: Diagnosis not present

## 2019-02-28 DIAGNOSIS — I110 Hypertensive heart disease with heart failure: Secondary | ICD-10-CM | POA: Diagnosis not present

## 2019-02-28 DIAGNOSIS — Z79899 Other long term (current) drug therapy: Secondary | ICD-10-CM | POA: Diagnosis not present

## 2019-02-28 DIAGNOSIS — K766 Portal hypertension: Secondary | ICD-10-CM | POA: Diagnosis not present

## 2019-02-28 DIAGNOSIS — K5900 Constipation, unspecified: Secondary | ICD-10-CM | POA: Diagnosis not present

## 2019-02-28 DIAGNOSIS — K760 Fatty (change of) liver, not elsewhere classified: Secondary | ICD-10-CM | POA: Diagnosis not present

## 2019-02-28 DIAGNOSIS — D649 Anemia, unspecified: Secondary | ICD-10-CM | POA: Diagnosis not present

## 2019-02-28 DIAGNOSIS — I5032 Chronic diastolic (congestive) heart failure: Secondary | ICD-10-CM | POA: Diagnosis not present

## 2019-02-28 DIAGNOSIS — Z794 Long term (current) use of insulin: Secondary | ICD-10-CM | POA: Diagnosis not present

## 2019-02-28 DIAGNOSIS — I81 Portal vein thrombosis: Secondary | ICD-10-CM | POA: Diagnosis not present

## 2019-02-28 DIAGNOSIS — K3189 Other diseases of stomach and duodenum: Secondary | ICD-10-CM | POA: Diagnosis not present

## 2019-02-28 DIAGNOSIS — E7800 Pure hypercholesterolemia, unspecified: Secondary | ICD-10-CM | POA: Diagnosis not present

## 2019-02-28 DIAGNOSIS — Z9119 Patient's noncompliance with other medical treatment and regimen: Secondary | ICD-10-CM | POA: Diagnosis not present

## 2019-02-28 DIAGNOSIS — D62 Acute posthemorrhagic anemia: Secondary | ICD-10-CM | POA: Diagnosis not present

## 2019-02-28 DIAGNOSIS — R4182 Altered mental status, unspecified: Secondary | ICD-10-CM | POA: Diagnosis not present

## 2019-02-28 DIAGNOSIS — Z9049 Acquired absence of other specified parts of digestive tract: Secondary | ICD-10-CM | POA: Diagnosis not present

## 2019-02-28 DIAGNOSIS — E119 Type 2 diabetes mellitus without complications: Secondary | ICD-10-CM | POA: Diagnosis not present

## 2019-02-28 DIAGNOSIS — K7460 Unspecified cirrhosis of liver: Secondary | ICD-10-CM | POA: Diagnosis not present

## 2019-02-28 DIAGNOSIS — F329 Major depressive disorder, single episode, unspecified: Secondary | ICD-10-CM | POA: Diagnosis not present

## 2019-02-28 DIAGNOSIS — R188 Other ascites: Secondary | ICD-10-CM | POA: Diagnosis not present

## 2019-02-28 DIAGNOSIS — M810 Age-related osteoporosis without current pathological fracture: Secondary | ICD-10-CM | POA: Diagnosis not present

## 2019-02-28 DIAGNOSIS — I5030 Unspecified diastolic (congestive) heart failure: Secondary | ICD-10-CM | POA: Diagnosis not present

## 2019-02-28 DIAGNOSIS — D500 Iron deficiency anemia secondary to blood loss (chronic): Secondary | ICD-10-CM | POA: Diagnosis not present

## 2019-02-28 DIAGNOSIS — E039 Hypothyroidism, unspecified: Secondary | ICD-10-CM | POA: Diagnosis not present

## 2019-02-28 DIAGNOSIS — M797 Fibromyalgia: Secondary | ICD-10-CM | POA: Diagnosis not present

## 2019-02-28 DIAGNOSIS — D696 Thrombocytopenia, unspecified: Secondary | ICD-10-CM | POA: Diagnosis not present

## 2019-02-28 DIAGNOSIS — D132 Benign neoplasm of duodenum: Secondary | ICD-10-CM | POA: Diagnosis not present

## 2019-02-28 LAB — COMPREHENSIVE METABOLIC PANEL
ALBUMIN: 1.9 g/dL — ABNORMAL LOW (ref 3.5–5.0)
ALKALINE PHOSPHATASE: 64 U/L (ref 38–126)
ALT (SGPT): 20 U/L (ref <35)
ANION GAP: 9 mmol/L (ref 7–15)
AST (SGOT): 30 U/L (ref 14–38)
BILIRUBIN TOTAL: 2.6 mg/dL — ABNORMAL HIGH (ref 0.0–1.2)
BLOOD UREA NITROGEN: 21 mg/dL (ref 7–21)
BUN / CREAT RATIO: 21
CALCIUM: 7.9 mg/dL — ABNORMAL LOW (ref 8.5–10.2)
CHLORIDE: 111 mmol/L — ABNORMAL HIGH (ref 98–107)
CO2: 18 mmol/L — ABNORMAL LOW (ref 22.0–30.0)
CREATININE: 0.98 mg/dL (ref 0.60–1.00)
EGFR CKD-EPI AA FEMALE: 71 mL/min/{1.73_m2} (ref >=60)
GLUCOSE RANDOM: 147 mg/dL (ref 70–179)
POTASSIUM: 3.3 mmol/L — ABNORMAL LOW (ref 3.5–5.0)
PROTEIN TOTAL: 4.3 g/dL — ABNORMAL LOW (ref 6.5–8.3)
SODIUM: 138 mmol/L (ref 135–145)

## 2019-02-28 LAB — POTASSIUM: Potassium:SCnc:Pt:Ser/Plas:Qn:: 3.3 — ABNORMAL LOW

## 2019-02-28 LAB — URINALYSIS
BACTERIA: NONE SEEN /HPF
BILIRUBIN UA: NEGATIVE
HYALINE CASTS: 5 /LPF — ABNORMAL HIGH (ref 0–1)
KETONES UA: NEGATIVE
LEUKOCYTE ESTERASE UA: NEGATIVE
NITRITE UA: NEGATIVE
PH UA: 5 (ref 5.0–9.0)
PROTEIN UA: NEGATIVE
RBC UA: <1 /HPF (ref <=4)
SPECIFIC GRAVITY UA: 1.006 (ref 1.003–1.030)
SQUAMOUS EPITHELIAL: <1 /HPF (ref 0–5)
UROBILINOGEN UA: 0.2
WBC UA: <1 /HPF (ref 0–5)

## 2019-02-28 LAB — CBC W/ AUTO DIFF
BASOPHILS ABSOLUTE COUNT: 0 10*9/L (ref 0.0–0.1)
BASOPHILS RELATIVE PERCENT: 0.7 %
EOSINOPHILS ABSOLUTE COUNT: 0.1 10*9/L (ref 0.0–0.4)
EOSINOPHILS RELATIVE PERCENT: 2.1 %
HEMATOCRIT: 23.2 % — ABNORMAL LOW (ref 36.0–46.0)
HEMOGLOBIN: 7.7 g/dL — ABNORMAL LOW (ref 12.0–16.0)
LARGE UNSTAINED CELLS: 4 % (ref 0–4)
LYMPHOCYTES RELATIVE PERCENT: 25.3 %
MEAN CORPUSCULAR HEMOGLOBIN CONC: 33.1 g/dL (ref 31.0–37.0)
MEAN CORPUSCULAR HEMOGLOBIN: 29.1 pg (ref 26.0–34.0)
MEAN CORPUSCULAR VOLUME: 87.8 fL (ref 80.0–100.0)
MEAN PLATELET VOLUME: 12.3 fL — ABNORMAL HIGH (ref 7.0–10.0)
MONOCYTES ABSOLUTE COUNT: 0.3 10*9/L (ref 0.2–0.8)
MONOCYTES RELATIVE PERCENT: 8.3 %
NEUTROPHILS RELATIVE PERCENT: 59.4 %
RED BLOOD CELL COUNT: 2.65 10*12/L — ABNORMAL LOW (ref 4.00–5.20)
RED CELL DISTRIBUTION WIDTH: 21.3 % — ABNORMAL HIGH (ref 12.0–15.0)
WBC ADJUSTED: 4.1 10*9/L — ABNORMAL LOW (ref 4.5–11.0)

## 2019-02-28 LAB — PHOSPHORUS: Phosphate:MCnc:Pt:Ser/Plas:Qn:: 3.9

## 2019-02-28 LAB — EOSINOPHILS RELATIVE PERCENT: Lab: 2.1

## 2019-02-28 LAB — MUCUS

## 2019-02-28 LAB — IRON PANEL
IRON SATURATION (CALC): 14 % — ABNORMAL LOW (ref 15–50)
IRON: 41 ug/dL (ref 35–165)
TRANSFERRIN: 236.6 mg/dL (ref 200.0–380.0)

## 2019-02-28 LAB — APTT: Coagulation surface induced:Time:Pt:PPP:Qn:Coag: 26.2

## 2019-02-28 LAB — LACTATE DEHYDROGENASE: Lactate dehydrogenase:CCnc:Pt:Ser/Plas:Qn:: 725 — ABNORMAL HIGH

## 2019-02-28 LAB — THYROID STIMULATING HORMONE: Thyrotropin:ACnc:Pt:Ser/Plas:Qn:: 2.675

## 2019-02-28 LAB — INR: Lab: 1.58

## 2019-02-28 LAB — MAGNESIUM: Magnesium:MCnc:Pt:Ser/Plas:Qn:: 1.7

## 2019-02-28 LAB — PROTIME-INR: PROTIME: 18.3 s — ABNORMAL HIGH (ref 10.2–13.1)

## 2019-02-28 LAB — HAPTOGLOBIN: Haptoglobin:MCnc:Pt:Ser/Plas:Qn:: <20 — ABNORMAL LOW

## 2019-02-28 LAB — IRON: Iron:MCnc:Pt:Ser/Plas:Qn:: 41

## 2019-02-28 LAB — BILIRUBIN DIRECT: Bilirubin.glucuronidated:MCnc:Pt:Ser/Plas:Qn:: 0.4

## 2019-02-28 LAB — SLIDE REVIEW

## 2019-02-28 LAB — POLYCHROMASIA

## 2019-02-28 LAB — TYPE AND SCREEN
ABO/RH(D): O NEG
Antibody Screen: NEGATIVE
Unit division: 0

## 2019-02-28 LAB — GLUCOSE, CAPILLARY: Glucose-Capillary: 234 mg/dL — ABNORMAL HIGH (ref 70–99)

## 2019-02-28 LAB — BPAM RBC
Blood Product Expiration Date: 202007052359
ISSUE DATE / TIME: 202006111511
Unit Type and Rh: 9500

## 2019-02-28 NOTE — Unmapped (Signed)
Order was placed for a PIV by Venous Access Team (VAT).  Patient was assessed for placement of a PIV. Access was obtained. Blood return noted.  Dressing intact and device well secured.  Flushed with normal saline.  Pt advised to inform RN of any s/s of discomfort at the PIV site.    Workup / Procedure Time:  15 minutes       Kim RN was notified.       Thank you,     Iantha Fallen RN Venous Access Team

## 2019-02-28 NOTE — Unmapped (Signed)
OCCUPATIONAL THERAPY  Evaluation(co-evaluation with PT, Marilyn French, for safe mobility) (02/28/19 0904)    Patient Name:  Marilyn French       Medical Record Number: [id number]   Date of Birth: Aug 08, 1956  Sex: Female          OT Treatment Diagnosis:  Decreased functional cognition impairing ADLs    Assessment  Problem List: Decreased cognition;Impaired judgement;Decreased safety awareness;Fall Risk;Impaired ADLs  Assessment: Marilyn French is a 63 y.o. female with a hx of NAFLD cirrhosis c/b hepatic encephalopathy and esophageal varices s/p TIPS with chronic blood loss anemia, HFpEF, T2DM, who was recently discharged 6/9 from OSH after receiving 3u pRBCs d/t acute blood loss anemia. Returned to OSH the next day due to progressively altered mental status as reported by her husband and was transferred to [facility] for further evaluation and treatment. Pt presents with decreased functional cognition, impairing her ability to perform ADLs safely and independently. The AM-PAC score of indicates 15/24 that pt is 56.46% impaired in self-care. Therefore, recommend continued acute OT services and post-acute OT services 5x/weekly at a low intensity. After review of the pt's occupational profile and history, assessment of occupational performance, clinical decision making, and development of POC, the pt presents as a moderate??complexity case.  Today's Interventions: AMPAC= 15/24. Educated pt on role of OT and safety during functional mobility with pt unable to verbalize understanding. Reoriented pt to time. Encouraged pt to engage in and assisted with toileting, sitting balance/tolerance, standing balance/tolerance, grooming, and bed mobility    Activity Tolerance During Today's Session  Patient tolerated treatment well;Patient limited by Mental Status    Plan  Planned Frequency of Treatment:  1-2x per day for: 3-4x week     Planned Interventions:  Adaptive equipment;ADL retraining;Balance activities;Compensatory tech. training;Conservation;Education - Patient;Education - Family / caregiver;Functional cognition;Functional mobility;Safety education;Therapeutic exercise;Transfer training;Bed mobility;Endurance activities;Home exercise program    Post-Discharge Occupational Therapy Recommendations:  OT Post Acute Discharge Recommendations: 5x weekly;Low intensity   OT DME Recommendations: Defer to post acute    GOALS:   Patient and Family Goals: Pt unable to state at this time    Short Term:  Pt will complete toileting, including hygiene and clothing management, with mod I   Time Frame : 2 weeks  Pt will complete LB dressing with min A   Time Frame : 2 weeks  Pt will participate in cognitive screen (SBT, O-Log, VACM Slums)   Time Frame : 1 week    Prognosis:  Fair  Positive Indicators:  caregiver support  Barriers to Discharge: Endurance deficits;Cognitive deficits;Functional strength deficits;Gait instability;Impaired Balance;Poor insight into deficits    Subjective  Current Status Pt received sitting on toilet with NA present. Pt left semi-reclined in bed with call bell within reach, bed alarm activated, and RN, Kim, notified   Prior Functional Status Pt appears to be a questionnable historian; therefore, reported information regarding PLOF and home setup may be incorrect. Pt reported her husband was assisting her with ADLs and IADLs PTA. Pt reported using no AD PTA           Patient / Caregiver reports: I need my medicine    Past Medical History:   Diagnosis Date   ??? Cirrhosis (CMS-HCC)    ??? Depression    ??? Diabetes mellitus (CMS-HCC)    ??? Fibromyalgia    ??? GAVE (gastric antral vascular ectasia)    ??? HTN (hypertension)    ??? Hypercholesterolemia    ??? Hypothyroid    ??? NAFLD (nonalcoholic  fatty liver disease)    ??? Osteoporosis     Social History     Tobacco Use   ??? Smoking status: Never Smoker   ??? Smokeless tobacco: Never Used   Substance Use Topics   ??? Alcohol use: No      Past Surgical History:   Procedure Laterality Date   ??? APPENDECTOMY     ??? CHOLECYSTECTOMY     ??? IR TIPS  07/08/2018    IR TIPS 07/08/2018 Soledad Gerlach, MD IMG VIR H&V [facility]   ??? PR RIGHT HEART CATH O2 SATURATION & CARDIAC OUTPUT Right 01/18/2018    Procedure: Right Heart Catheterization;  Surgeon: Marlaine Hind, MD;  Location: [facility] CATH;  Service: Cardiology   ??? SALPINGOOPHORECTOMY      History reviewed. No pertinent family history.     Ferumoxytol and Tramadol hcl     Objective Findings  Precautions / Restrictions  Falls precautions    Communication Preference  Verbal    Pain  Pt reported chest pain at beginning of session, but did not quantify. Pt reported no pain at end of session. RN, Selena Batten, notified    Equipment / Environment  Vascular access (PIV, TLC, Port-a-cath, PICC);Patient not wearing mask for full session(pt wore mask 50% of session)    Living Situation  Living Environment: House  Lives With: Spouse(pt reported husband is available to provide assistance 24/7)  Home Living: One level home;Level entry;Tub/shower unit;Tub bench;Raised toilet seat with rails;Grab bars in shower     Cognition   Orientation Level:  Disoriented to time(Unable to recall day of the week or month. Oriented to year)   Arousal/Alertness:  Delayed responses to stimuli;Inconsistent responses to stimuli   Attention Span:  Difficulty attending to directions   Memory:  Unable to assess   Following Commands:  Follows one step commands with repetition;Follows one step commands with increased time   Safety Judgment:  Decreased awareness of need for assistance;Decreased awareness of need for safety   Awareness of Errors:  Decreased awareness of need for assistance;Decreased awareness of need for safety;Assistance required to identify errors made;Assistance required to correct errors made   Problem Solving:  Assistance required to identify errors made;Assistance required to generate solutions;Assistance required to implement solutions   Comments: Pt demonstrated increased processing time and decreased task sequencing, requiring repeated one-step commands and verbal and tactile cues throughout session    Vision / Perception        Vision: Wears glasses for reading only        Hand Function  Hand Dominance: L   BUE grip strength WFL     Skin Inspection  Visible skin appeared intact     ROM / Strength/Coordination  UE ROM/ Strength/ Coordination: BUE AROM and strength WFL   LE ROM/ Strength/ Coordination: Defer to PT note     Sensation:  Pt reported no paresthesias in UE or LE     Balance:  Sitting= supervision. Standing= CGA and RW. Ambulating= CGA and RW    Functional Mobility  Transfer Assistance Needed: Yes(Pt t/f off toilet with CGA, RW, and use of grab bar. Pt ambulated to sink with CGA and RW. Pt ambulated to EOB with HHA x 2. Pt t/f EOB <> standing with min A and HHA x 2. HHA replaced RW d/t pt unability to understand proper use of RW)  Bed Mobility Assistance Needed: Yes(Pt t/f EOB > supine with SBA and use of grab bar. Throughout all bed mobility and t/fs,  pt required max verbal and tactile cues for sequencing and increased time)    ADLs  ADLs: Needs assistance with ADLs  ADLs - Needs Assistance: Grooming;Bathing;Toileting;UB dressing;LB dressing;Feeding  Feeding - Needs Assistance: (Min A for sequencing)  Grooming - Needs Assistance: (Pt required min A and increased time to sequence task of hand washing)  Bathing - Needs Assistance: (Mod A )  Toileting - Needs Assistance: (Pt completed t/f, toilet hygiene, and clothing management with CGA and RW)  UB Dressing - Needs Assistance: (Mod A)  LB Dressing - Needs Assistance: (Pt unable to doff sock while sitting EOB )  IADLs: Not assessed    Medical Staff Made Aware: RN, Kim, notified          I attest that I have reviewed the above information.  Signed: Su Grand, OT  Filed 02/28/2019

## 2019-02-28 NOTE — Unmapped (Signed)
General Medicine (MDW) History and Physical    Assessment/Plan:  Marilyn French is a 63 y.o. female with a hx of NAFLD cirrhosis c/b hepatic encephalopathy and esophageal varices s/p TIPS with chronic blood loss anemia, HFpEF, T2DM, who was recently discharged 6/9 from OSH after receiving 3u pRBCs d/t acute blood loss anemia. Returned to OSH the next day due to progressively altered mental status as reported by her husband and was transferred to [facility] for further evaluation and treatment.     Principal Problem:    Hepatic encephalopathy (CMS-HCC)  Active Problems:    NAFLD (nonalcoholic fatty liver disease)    Cirrhosis (CMS-HCC)    GAVE (gastric antral vascular ectasia)    Hypothyroid    Chronic blood loss anemia     LOS: 0 days     Nonalcoholic cirrhosis c/b hepatic encephalopathy Patient with altered mental status in the context of acute-on-chronic blood loss anemia w/ known esophageal varices/GAVE. She has been receiving transfusions almost weekly. Altered mental status likely triggered by ongoing UGIB, hyperammonemia especially given patient is s/p TIPS 06/2018. Major variceal bleed unlikely given hemodynamic stability and appropriate response to transfusion at OSH. Low suspicion for infectious etiology to AMS, patient afebrile, no leukocytosis, no localizing signs or symptoms. CXR with no acute airspace opacities, stable cardiomegaly. Will trend hemoglobin, continue home meds as below. Patient is currently being worked up at [facility] for liver transplant. Most recent MELD is 15. Endoscopy scheduled 6/12 with [facility]  -continue PPI  -continue spironolactone, torsemide, lactulose, rifaximin  -repeat UA   -diagnostic paracentesis  -daily CBC, INR    Acute on chronic iron deficiency anemia secondary to chronic blood loss secondary to known portal hypertension and esophageal varices s/p multiple bands, GAVE requiring APC with limited benefit. On 6/8 her hemoglobin was routinely checked and noted to be 4.8 from a recent prior of 6.4 with a baseline around 7. She was admitted to OSH given 3u PRBCs, follow up Hgb 7.9, discharged the following day (6/9). At OSH 6/11, Hgb 6.6. Given 1u, f/u Hgb 8.0 prior to transfer. Low concern for acute bleed given appropriate response to transfusion, known chronic blood loss anemia requiring weekly transfusions, and hemodynamic stability.   -AM CBC, type and screen  -Scheduled for upper endoscopy 02/28/2019 as above  -Appreciate GI recs  -transfuse for Hgb <7    HFpEF TTE 11/27/16 EF 60-65% with normal systolic function, LVH, mild MR and mild LA dilation. VSS, Patient appears euvolemic on exam, CXR with stable cardiomegaly   -f/u CXR  -continue home diuresis  -daily weights  -monitor I&Os     Thrombocytopenia baseline around 50 with known ongoing blood loss anemia in the setting of chronic cirrhosis, appears to be at baseline on admission.   -AM CBC    Chronic, Stable Medical Conditions  Hypothyroidism: levothyroxine 25 mcg  T2DM: on lantus 40u nightly + SSI at home. Lantus 20u + SSI while NPO    F:none  E: replete PRN  N: NPO    GI PPx: PPI  DVT PPx: SCDs    Code Status: Full Code, though patient likely without capacity to make such decisions at this time.  Identified her husband and daughter as preferred surrogate decision makers.    Disposition: Admit to Med W  __________________________________________________________  Chief Complaint:  Hepatic encephalopathy (CMS-HCC)    HPI:  Marilyn French is a 64 y.o. female with hx of of NASH with cirrhosis c/b hepatic encephalopathy and esophageal varices eradicated with banding  and GAVE c/b chronic blood loss anemia, HFpEF, T2DM, who was recently discharged 6/9 from [facility] after receiving 3u pRBCs d/t acute blood loss anemia (after presenting for a hemoglobin of 4.8 found on routine outpatient labs).  Returned to [facility] on 6/10 due to progressively altered mental status as reported by her husband. WBC was 7.0 with a normal differential, hemoglobin 7.4 g/dL and platelets 50. Sodium 141, potassium 3.8, chloride 113 and CO2 22 mmol/L. Calcium 8.2, glucose 132, BUN 26 and creatinine 0.99 mg/dL. Total protein was 4.7 and albumin 2.3 g/dL. Transaminases were normal. Total bilirubin was 3.0 mg/dL. CT head without contrast was negative for acute intracranial pathology. Patient was deemed stable, received lactulose, rifaximin, and ceftriaxone for empiric SBP coverage, and was transferred to [facility] [place] for ongoing workup of altered mental status due to suspected hepatic encephalopathy and ongoing transfusion dependent blood loss anemia.      On arrival, patient was saturating well on room air, afebrile, and in no acute distress. Patient unable to meaningfully participate in the interview.     Allergies:  Ferumoxytol and Tramadol hcl  Medications:   Prior to Admission medications    Medication Dose, Route, Frequency   busPIRone (BUSPAR) 10 MG tablet 10 mg, Oral, 2 times a day   cholecalciferol, vitamin D3, (CHOLECALCIFEROL) 1,000 unit tablet 1,000 Units, Oral, Every Monday, Wednesday, Friday   insulin ASPART (NOVOLOG FLEXPEN U-100 INSULIN) 100 unit/mL injection pen Use with given sliding scale    CBG 70-120: 0 units  CBG 121-150: 1 unit  CBG 151-200: 2 units  CBG 201-250: 3 units  CBG 251-300: 5 units  CBG 301-350: 7 units  CBG greater than 351: 9 units   lactulose (CHRONULAC) 10 gram/15 mL solution 20 g, Oral, 4 times a day   levothyroxine (SYNTHROID, LEVOTHROID) 25 MCG tablet 25 mcg, Oral, Daily (standard)   magnesium oxide (MAG-OX) 400 mg (241.3 mg magnesium) tablet 800 mg, Oral, Daily (standard), Take for hand and/or leg cramping.   OXYGEN-AIR DELIVERY SYSTEMS MISC 2 L, Inhalation, Nightly   pantoprazole (PROTONIX) 40 MG tablet 40 mg, Oral, Daily (standard)   pediatric multivit-iron-min (FLINTSTONES COMPLETE) tablet 2 tablets, Oral, Daily (standard)   pen needle, diabetic 31 gauge x 1/4 Ndle No dose, route, or frequency recorded.   potassium chloride SA (K-DUR,KLOR-CON) 20 MEQ tablet 20 mEq, Oral, Daily (standard)   rifAXIMin (XIFAXAN) 550 mg Tab 550 mg, Oral, 2 times a day (standard)   rosuvastatin (CRESTOR) 5 MG tablet 5 mg, Oral, Every other day   sertraline (ZOLOFT) 50 MG tablet 50 mg, Oral, Daily (standard), Please take 1/2 tablet (25mg ) daily by mouth for 4 days, then increase to 1 tablet (50mg ) by mouth daily.   torsemide (DEMADEX) 20 MG tablet 20 mg, Oral, Daily (standard)   insulin glargine (LANTUS SOLOSTAR U-100 INSULIN) 100 unit/mL (3 mL) injection pen 35 Units, Subcutaneous, Daily (standard)  Patient taking differently: Inject 45 Units under the skin daily.    spironolactone (ALDACTONE) 50 MG tablet 50 mg, Oral, Daily (standard)     Medical History:  Past Medical History:   Diagnosis Date   ??? Cirrhosis (CMS-HCC)    ??? Depression    ??? Diabetes mellitus (CMS-HCC)    ??? Fibromyalgia    ??? GAVE (gastric antral vascular ectasia)    ??? HTN (hypertension)    ??? Hypercholesterolemia    ??? Hypothyroid    ??? NAFLD (nonalcoholic fatty liver disease)    ??? Osteoporosis  Surgical History:  Past Surgical History:   Procedure Laterality Date   ??? APPENDECTOMY     ??? CHOLECYSTECTOMY     ??? IR TIPS  07/08/2018    IR TIPS 07/08/2018 Soledad Gerlach, MD IMG VIR [facility]&V [facility]   ??? PR RIGHT HEART CATH O2 SATURATION & CARDIAC OUTPUT Right 01/18/2018    Procedure: Right Heart Catheterization;  Surgeon: Marlaine Hind, MD;  Location: [facility] CATH;  Service: Cardiology   ??? SALPINGOOPHORECTOMY       Social History:  Social History     Socioeconomic History   ??? Marital status: Married     Spouse name: Not on file   ??? Number of children: Not on file   ??? Years of education: Not on file   ??? Highest education level: Not on file   Occupational History   ??? Not on file   Social Needs   ??? Financial resource strain: Not on file   ??? Food insecurity     Worry: Not on file     Inability: Not on file   ??? Transportation needs     Medical: Not on file     Non-medical: Not on file   Tobacco Use   ??? Smoking status: Never Smoker   ??? Smokeless tobacco: Never Used   Substance and Sexual Activity   ??? Alcohol use: No   ??? Drug use: No   ??? Sexual activity: Not on file   Lifestyle   ??? Physical activity     Days per week: Not on file     Minutes per session: Not on file   ??? Stress: Not on file   Relationships   ??? Social Wellsite geologist on phone: Not on file     Gets together: Not on file     Attends religious service: Not on file     Active member of club or organization: Not on file     Attends meetings of clubs or organizations: Not on file     Relationship status: Not on file   Other Topics Concern   ??? Not on file   Social History Narrative   ??? Not on file     Family History:  No family history on file.  Review of Systems:  10 systems reviewed and are negative unless otherwise mentioned in HPI    Vital Signs:  Temp:  [36.1 ??C (97 ??F)] 36.1 ??C (97 ??F)  Heart Rate:  [80] 80  BP: (135)/(56) 135/56  SpO2:  [97 %] 97 %  BMI (Calculated):  [37.49] 37.49    Physical Exam:  GEN: in NAD, lying comfortably in bed  HENT: MMM without exudates  EYES: EOMI, PERRL, minimal scleral icterus  CV: normal rate, regular rhythm no JVD appreciated at 30 degrees, 3/6 systolic murmur  RESP: CTA b/l, no increased WOB on RA  ABD: soft, non-tender, non-distended, +BS, no HSM  EXTR: no LE pitting edema, +2 radial and DP pulses b/l  GU: no CVA tenderness  SKIN: warm. Large bruise on left medial arm.  NEURO: Alert, oriented to self only, no focal deficits.  Subtle asterixis.  PSYCH: Flat affect    Labs/Studies:  Labs and Studies from the last 24hrs per EMR and Reviewed    Marilyn French MS4      I attest that I have reviewed the medical student note and that the components of the history of the present illness, the physical exam, and the assessment  and plan documented were performed by me or were performed in my presence by the student where I verified the documentation and performed (or re-performed) the exam and medical decision making. Carrolyn Meiers, MD

## 2019-02-28 NOTE — Unmapped (Signed)
Patient  Alert and oriented x 4 this shift.  Patient denies any pain or distress. Patient took lactulose  And has adequate BMs  Patient updated by med team.  No needs at this time.    Problem: Adult Inpatient Plan of Care  Goal: Plan of Care Review  Outcome: Ongoing - Unchanged  Goal: Patient-Specific Goal (Individualization)  Outcome: Ongoing - Unchanged  Flowsheets (Taken 02/28/2019 1522)  Patient-Specific Goals (Include Timeframe): Patient will remain free from falls by the end of hodpital stay  Individualized Care Needs: Falls Lactulose, Bleeding  Anxieties, Fears or Concerns: None voiced  Goal: Absence of Hospital-Acquired Illness or Injury  Outcome: Ongoing - Unchanged  Goal: Optimal Comfort and Wellbeing  Outcome: Ongoing - Unchanged  Goal: Readiness for Transition of Care  Outcome: Ongoing - Unchanged  Goal: Rounds/Family Conference  Outcome: Ongoing - Unchanged     Problem: Adult Inpatient Plan of Care  Goal: Plan of Care Review  Outcome: Ongoing - Unchanged

## 2019-02-28 NOTE — Unmapped (Addendum)
Marilyn French is a 63 y.o. female with a hx of NAFLD cirrhosis c/b hepatic encephalopathy and esophageal varices s/p TIPS with chronic blood loss anemia, HFpEF, T2DM, who was admitted to [facility] on 02/27/2019 with worsening encephalopathy. Hospital course by problem below:    Nonalcoholic cirrhosis c/b hepatic encephalopathy: patient presented with worsening encephalopathy in the setting of recent hospitalization for transfusion due to blood loss anemia. She had missed some doses of lactulose following discharge according to her husband, so likely that this was a large contributing factor. She was covered empirically for SBP and antibiotics were continued for prophylaxis in the setting of suspected upper GI bleed. There was no free fluid when diagnostic paracentesis was attempted and infectious workup was negative, however antibiotics were continued for prophylaxis in the setting of upper GI bleed. Mental status rapidly improved with lactulose. She was originally scheduled for EGD on 6/12 but this was deferred due to her encephalopathy. Hepatology was consulted and recommended EGD after mental status improved. EGD 6/15 showed GAVE with contact bleeding, which was treated with argon plasma coagulation. She was discharged with plan for outpatient follow up with GI and repeat EGD with re-treatment of GAVE 1 month after discharge.    Acute on chronic iron deficiency anemia secondary to chronic blood loss: patient is transfusion dependent (baseling hgb ~7) and prior to admission had just been admitted to OSH for transfusion of 3u of blood due to hgb of 4.8. Hgb on arrival 7.7. Her hemoglobin dropped to 5.7 on 6/14 she received 2 units PRBCs with appropriate response. She received another 1u PRBCs on 6/16 with appropriate response. EGD as above determined GAVE as he cause of her anemia and hemoglobin on day of discharge was 8.8. She was arranged follow up with local GI office for routine CBC with transfusion if she required one based on results.

## 2019-02-28 NOTE — Unmapped (Signed)
Diagnostic Paracentesis Procedure Note (CPT [id number])    Pre-procedural Planning     Patient Name:: Marilyn French  Patient MRN: [id number]    Indications:  Ascites with encephalopathy     Known Bleeding Diathesis: Patient/caregiver denies any known bleeding or platelet disorder.     Antiplatelet Agents: This patient is not on an antiplatelet agent.    Systemic Anticoagulation: This patient is not on full systemic anticoagulation.    Significant Labs:  INR   Date Value Ref Range Status   02/28/2019 1.58  Final     PT   Date Value Ref Range Status   02/28/2019 18.3 (H) 10.2 - 13.1 sec Final     APTT   Date Value Ref Range Status   02/28/2019 26.2 25.9 - 39.5 sec Final     Platelet   Date Value Ref Range Status   02/28/2019 57 (L) 150 - 440 10*9/L Final       Consent: Informed consent was obtained after explanation of the risks (including bleeding, infection, bowel perforation, and leaking) and benefits of the procedure. Refer to the consent documentation.    Procedure Details     Time-out was performed immediately prior to the procedure.    The head of the bed was placed at 45 degrees above level. Both the right lower and left lower quadrant were visualized with no ascites fluid visualized. Procedure was cancelled due to no safe pocket for paracentesis.     Findings     Insufficient ascites for procedure.    Condition     Procedure was cancelled and patient remained in same condition as pre-procedure.     Complications     Complications:  None; patient tolerated the procedure well.      Requesting Service: Med Candie Echevaria (MDW)    Time Requested: 1300  Time Completed: 1430  Comments: N/A    Resident(s) Performing Procedure: Pamalee Leyden, MD  Resident Year: PGY3       (large spleen with L kidney. No evidence of free fluid)          (liver and R kidney, no evidence of free fluid)         no free fluid in rectouterine space (uterus surgically absent)

## 2019-02-28 NOTE — Unmapped (Signed)
Care Management  Initial Transition Planning Assessment      Per H & P: Marilyn French is a 63 y.o. female with a hx of NAFLD cirrhosis c/b hepatic encephalopathy and esophageal varices s/p TIPS with chronic blood loss anemia, HFpEF, T2DM, who was recently discharged 6/9 from OSH after receiving 3u pRBCs d/t acute blood loss anemia              General  Care Manager assessed the patient by : Discussion with Clinical Care team, Medical record review(AMS: assessment done by EMR review, left vm for family)  Orientation Level: Disoriented to place, Disoriented to time  Who provides care at home?: N/A  Reason for referral: Discharge Planning    Contact/Decision Maker  Extended Emergency Contact Information  Primary Emergency Contact: [place]  Address: [address]           [place], [place] [address] [place] of [place]  Home Phone: [phone]  Mobile Phone: [phone]  Relation: Spouse  Preferred language: ENGLISH  Interpreter needed? No    Legal Next of Kin / Guardian / POA / Advance Directives       Advance Directive (Medical Treatment)  Healthcare Decision Maker: Patient does not wish to appoint a Health Care Decision Maker at this time  Referral Made: No    Health Care Decision Maker [HCDM] (Medical & Mental Health Treatment)  Healthcare Decision Maker: Patient does not wish to appoint a Health Care Decision Maker at this time  Referral Made: No    Advance Directive (Mental Health Treatment)  Does patient have an advance directive covering mental health treatment?: Patient does not have advance directive covering mental health treatment.  Reason patient does not have an advance directive covering mental health treatment:: Patient does not wish to complete one at this time.    Patient Information  Lives with: Spouse/significant other    Type of Residence: Private residence    Type of Residence: Mailing Address:  [address]  [place] [address]  Contacts:    Patient Phone Number: [phone] (home)     Medical Provider(s): Ignatius Specking, MD  Reason for Admission: Admitting Diagnosis:  Hepatic Encephalapathy  Past Medical History:   has a past medical history of Cirrhosis (CMS-HCC), Depression, Diabetes mellitus (CMS-HCC), Fibromyalgia, GAVE (gastric antral vascular ectasia), HTN (hypertension), Hypercholesterolemia, Hypothyroid, NAFLD (nonalcoholic fatty liver disease), and Osteoporosis.  Past Surgical History:   has a past surgical history that includes Appendectomy; Cholecystectomy; Salpingoophorectomy; pr right heart cath o2 saturation & cardiac output (Right, 01/18/2018); and IR Tips (07/08/2018).   Previous admit date: 07/07/2018    Primary Insurance- Payor: Manufacturing engineer / Plan: HEALTH TEAM ADVANTAGE / Product Type: *No Product type* /   Secondary Insurance ??? None  Prescription Coverage ???   Preferred Pharmacy - [facility] [address] [place] - [address]  [facility] - [place], [place] - [address]  [facility]    Transportation home: Private vehicle  Level of function prior to admission: Independent     Location/Detail: [address] [place], [place] [address]    Support Systems: Family Members, Spouse, Children    Responsibilities/Dependents at home?: No    Home Care services in place prior to admission?: No     Outpatient/Community Resources in place prior to admission: Clinic  Agency detail (Name/Phone #): PCP: Doreen Beam B     Equipment Currently Used at  Home: oxygen, walker, rolling, shower chair, commode  Current HME Agency (Name/Phone #): [facility]-pt on O2 at night only 2lpm    Currently receiving outpatient dialysis?: No     Financial Information     Need for financial assistance?: No     Social Determinants of Health  Social Determinants of Health were addressed in provider documentation.  Please refer to patient history.    Discharge Needs Assessment  Concerns to be Addressed: discharge planning    Clinical Risk Factors: New Diagnosis, Multiple Diagnoses (Chronic)    Barriers to taking medications: No    Prior overnight hospital stay or ED visit in last 90 days: No    Readmission Within the Last 30 Days: no previous admission in last 30 days    Anticipated Changes Related to Illness: none    Equipment Needed After Discharge: none    Discharge Facility/Level of Care Needs: other (see comments)(TBD)    Readmission  Risk of Unplanned Readmission Score: UNPLANNED READMISSION SCORE: 12%  Predictive Model Details           12% (Medium) Factors Contributing to Score   Calculated 02/28/2019 13:53 33% Number of active Rx orders is 33    Risk of Unplanned Readmission Model 13% Latest calcium is low (7.9 mg/dL)     16% Charlson Comorbidity Index is 6     10% Imaging order is present in last 6 months     9% Latest hemoglobin is low (7.7 g/dL)     9% Phosphorous result is present     7% Age is 57     7% Number of hospitalizations in last year is 1     Readmitted Within the Last 30 Days? (No if blank)   Patient at risk for readmission?: N/A    Discharge Plan  Screen findings are: Care Manager reviewed the plan of the patient's care with the Multidisciplinary Team. No discharge planning needs identified at this time. Care Manager will continue to manage plan and monitor patient's progress with the team.    Expected Discharge Date: 03/03/19    Expected Transfer from Critical Care:      Patient and/or family were provided with choice of facilities / services that are available and appropriate to meet post hospital care needs?: N/A     Initial Assessment complete?: Yes

## 2019-02-28 NOTE — Unmapped (Signed)
PHYSICAL THERAPY  Evaluation(co-evaluation with Marilyn French, OT) (02/28/19 0905)     Patient Name:  Marilyn French       Medical Record Number: [id number]   Date of Birth: [birth year]  Sex: Female            Treatment Diagnosis: Generalized weakness    ASSESSMENT  Problem List: Decreased strength;Decreased mobility;Decreased safety awareness;Gait deviation;Decreased endurance;Impaired balance;Decreased cognition;Fall Risk     Assessment : Marilyn French is a 63 y.o. female with a hx of NAFLD cirrhosis c/b hepatic encephalopathy and esophageal varices s/p TIPS with chronic blood loss anemia, HFpEF, T2DM, who was recently discharged 6/9 from OSH after receiving 3u pRBCs d/t acute blood loss anemia. Returned to OSH the next day due to progressively altered mental status as reported by her husband and was transferred to [facility] for further evaluation and treatment.  Pt was agreeable to work with PT, demonstrating bed mobility, transfers, and ambulating within the room environment. The pt demonstrates deficits in strength, endurance, transfer independence, balance, and ambulation. After a review of the personal factors, comorbidities, clinical presentation, and examination of the number of affected body systems, the patient presents as a moderate complexity case.     Today's Interventions: Pt educated re: PT role and POC, calling out for assist and not getting up by herself.                           PLAN  Planned Frequency of Treatment:  1-2x per day for: 3-4x week      Planned Interventions: Balance activities;Diaphragmatic / Pursed-lip breathing;Education - Patient;Endurance activities;Functional cognition;Functional mobility;Gait training;Home exercise program;Neuromuscular re-education;Postural re-education;Self-care / Home training;Therapeutic exercise;Therapeutic activity;Transfer training    Post-Discharge Physical Therapy Recommendations:  5x weekly;Low intensity    PT DME Recommendations: Defer to post acute Goals:   Patient and Family Goals: Did not state    Long Term Goal #1: In 6-8 weeks, pt will demonstrate 20/20 on AM PAC        SHORT GOAL #1: Pt will demonstrate all functional transfers with modified independence and LRAD              Time Frame : 2 weeks  SHORT GOAL #2: Pt will demonstrate all bed mobility independently              Time Frame : 2 weeks  SHORT GOAL #3: Pt will ambulate 176ft with modified independence and LRAD              Time Frame : 2 weeks                                        Prognosis:  Good  Positive Indicators: Participation, good family support  Barriers to Discharge: Endurance deficits;Cognitive deficits;Functional strength deficits;Gait instability;Impaired Balance;Poor insight into deficits    SUBJECTIVE  Patient reports: I can get out of the house Pt was agreeable to work with PT  Current Functional Status: Pt rec'd in bathroom with CNA in room and left in reclined supine in bed with call bell and all needs within reach, bed alarm on.      Prior Functional Status: Pt was a poor historian and had delayed responses, but stated she would need her husband's assist for ambulation.   Equipment available at home: None     Past Medical History:   Diagnosis Date   ???  Cirrhosis (CMS-HCC)    ??? Depression    ??? Diabetes mellitus (CMS-HCC)    ??? Fibromyalgia    ??? GAVE (gastric antral vascular ectasia)    ??? HTN (hypertension)    ??? Hypercholesterolemia    ??? Hypothyroid    ??? NAFLD (nonalcoholic fatty liver disease)    ??? Osteoporosis     Social History     Tobacco Use   ??? Smoking status: Never Smoker   ??? Smokeless tobacco: Never Used   Substance Use Topics   ??? Alcohol use: No      Past Surgical History:   Procedure Laterality Date   ??? APPENDECTOMY     ??? CHOLECYSTECTOMY     ??? IR TIPS  07/08/2018    IR TIPS 07/08/2018 Soledad Gerlach, MD IMG VIR [facility]   ??? PR RIGHT HEART CATH O2 SATURATION & CARDIAC OUTPUT Right 01/18/2018    Procedure: Right Heart Catheterization;  Surgeon: Marlaine Hind, MD;  Location: [facility] CATH;  Service: Cardiology   ??? SALPINGOOPHORECTOMY      History reviewed. No pertinent family history.     Allergies: Ferumoxytol and Tramadol hcl                Objective Findings  Precautions / Restrictions  Precautions: Falls precautions  Weight Bearing Status: Non-applicable  Required Braces or Orthoses: Non-applicable        Pain Comments: Pt c/o chest pain at start of session 3-4/10, but then c/o no pain at end of session.  RN made aware of chest pain.      Equipment / Environment: Vascular access (PIV, TLC, Port-a-cath, PICC);Patient not wearing mask for full session    At Rest: NAD  With Activity: NAD  Orthostatics: asymptomatic        Living Situation  Living Environment: House  Lives With: Spouse(pt reported husband is available to provide assistance 24/7)  Home Living: One level home;Level entry;Tub/shower unit;Tub bench;Raised toilet seat with rails;Grab bars in shower     Cognition: Pt oriented to person, place, situation, and year (not month or day of weak). Pt with delayed responses throughout the session with difficulties with sequencing, progressing, and problem solving.     Skin Inspection: All visible skin intact           LE ROM: Appears WFL   LE Strength: Pt able to demonstrtae LAQ bilat                          Balance: sitting EOB with stand by assist, standing balance with contact guard/minimal assist and RW/HHA         Bed Mobility: sit --> reclined supine with stand by assist.   Transfers: sit <--> stand with RW from toilet height with OT and contact guard assist, and from bed height with minimal assist +2 and bilat HHA.    Gait  Level of Assistance: Minimal assist, patient does 75% or more  Assistive Device: Front wheel walker  Distance Ambulated (ft): 5 ft  Gait: Pt ambulated ~72ft within room environment with minimal assist and RW with maximal verbal cues for technique. Pt also took side steps towards [facility] with minimal assist +2 and bilat HHA. Verbal and demonstrative cues used throughout.          Endurance: Fair     Physical Therapy Session Duration  PT Individual - Duration: 30    Medical Staff Made Aware: RN Kim cleared pt for  PT and made aware of outcomes.     I attest that I have reviewed the above information.  Signed: Caro Hight, PT  Filed 02/28/2019

## 2019-02-28 NOTE — Unmapped (Signed)
Pt admitted this shift with hepatic encephalopath from outside hospital. She is alert and oriented to self. She denied any pain. Pt is continent of bowel and bladder and able to ambulate to the bathroom with assist. Admission process not completed as pt was drowsy/confused and not able to provide accurate information-Charge Nurse notified. Falls precaution in place, call bell and bedside table within reach. Will continue with plan of care.   Problem: Adult Inpatient Plan of Care  Goal: Plan of Care Review  Flowsheets (Taken 02/28/2019 0314)  Plan of Care Reviewed With: patient  Goal: Patient-Specific Goal (Individualization)  Flowsheets (Taken 02/28/2019 0314)  Patient-Specific Goals (Include Timeframe): Pt will remain falls free through this shift  Individualized Care Needs: Fall prevention, monitor for bleeding, Labs/VS, NPO  Anxieties, Fears or Concerns: none voiced-Pt is confused  Goal: Absence of Hospital-Acquired Illness or Injury  Intervention: Identify and Manage Fall Risk  Flowsheets (Taken 02/28/2019 0314)  Safety Interventions: fall reduction program maintained  Intervention: Prevent Skin Injury  Flowsheets (Taken 02/28/2019 0314)  Pressure Reduction Techniques: frequent weight shift encouraged  Intervention: Prevent VTE (venous thromboembolism)  Flowsheets (Taken 02/28/2019 0314)  VTE Prevention/Management: anticoagulation therapy  Note: SCD  Intervention: Prevent Infection  Flowsheets (Taken 02/28/2019 0314)  Infection Prevention: handwashing promoted  Goal: Optimal Comfort and Wellbeing  Intervention: Monitor Pain and Promote Comfort  Flowsheets (Taken 02/28/2019 0314)  Pain Management Interventions:  ??? care clustered  ??? quiet environment facilitated  Intervention: Provide Person-Centered Care  Flowsheets (Taken 02/28/2019 0314)  Trust Relationship/Rapport:  ??? care explained  ??? choices provided  ??? questions encouraged     Problem: Fall Injury Risk  Goal: Absence of Fall and Fall-Related Injury  Intervention: Identify and Manage Contributors to Fall Injury Risk  Flowsheets (Taken 02/28/2019 0314)  Self-Care Promotion:  ??? BADL personal objects within reach  ??? meal setup provided  Intervention: Promote Injury-Free Environment  Flowsheets (Taken 02/28/2019 0314)  Safety Interventions: fall reduction program maintained  Environmental Safety Modification:  ??? clutter free environment maintained  ??? lighting adjusted     Problem: Anemia  Goal: Anemia Symptom Improvement  Intervention: Monitor and Manage Anemia  Flowsheets (Taken 02/28/2019 0314)  Fatigue Management: activity assistance provided

## 2019-03-01 LAB — CBC W/ AUTO DIFF
BASOPHILS ABSOLUTE COUNT: 0 10*9/L (ref 0.0–0.1)
BASOPHILS RELATIVE PERCENT: 0.8 %
EOSINOPHILS ABSOLUTE COUNT: 0.1 10*9/L (ref 0.0–0.4)
EOSINOPHILS RELATIVE PERCENT: 2.1 %
HEMATOCRIT: 21.8 % — ABNORMAL LOW (ref 36.0–46.0)
LARGE UNSTAINED CELLS: 4 % (ref 0–4)
LYMPHOCYTES ABSOLUTE COUNT: 1.1 10*9/L — ABNORMAL LOW (ref 1.5–5.0)
LYMPHOCYTES RELATIVE PERCENT: 29.2 %
MEAN CORPUSCULAR HEMOGLOBIN CONC: 33.1 g/dL (ref 31.0–37.0)
MEAN CORPUSCULAR HEMOGLOBIN: 29.2 pg (ref 26.0–34.0)
MEAN CORPUSCULAR VOLUME: 88.4 fL (ref 80.0–100.0)
MEAN PLATELET VOLUME: 13.3 fL — ABNORMAL HIGH (ref 7.0–10.0)
MONOCYTES ABSOLUTE COUNT: 0.3 10*9/L (ref 0.2–0.8)
MONOCYTES RELATIVE PERCENT: 7.5 %
NEUTROPHILS ABSOLUTE COUNT: 2 10*9/L (ref 2.0–7.5)
NEUTROPHILS RELATIVE PERCENT: 56.3 %
PLATELET COUNT: 61 10*9/L — ABNORMAL LOW (ref 150–440)
RED BLOOD CELL COUNT: 2.47 10*12/L — ABNORMAL LOW (ref 4.00–5.20)
RED CELL DISTRIBUTION WIDTH: 22 % — ABNORMAL HIGH (ref 12.0–15.0)
WBC ADJUSTED: 3.6 10*9/L — ABNORMAL LOW (ref 4.5–11.0)

## 2019-03-01 LAB — COMPREHENSIVE METABOLIC PANEL
ALBUMIN: 2 g/dL — ABNORMAL LOW (ref 3.5–5.0)
ALKALINE PHOSPHATASE: 63 U/L (ref 38–126)
ALT (SGPT): 19 U/L (ref <35)
ANION GAP: 7 mmol/L (ref 7–15)
AST (SGOT): 28 U/L (ref 14–38)
BILIRUBIN TOTAL: 2.1 mg/dL — ABNORMAL HIGH (ref 0.0–1.2)
BLOOD UREA NITROGEN: 19 mg/dL (ref 7–21)
CALCIUM: 7.9 mg/dL — ABNORMAL LOW (ref 8.5–10.2)
CHLORIDE: 112 mmol/L — ABNORMAL HIGH (ref 98–107)
CO2: 21 mmol/L — ABNORMAL LOW (ref 22.0–30.0)
CREATININE: 0.97 mg/dL (ref 0.60–1.00)
EGFR CKD-EPI AA FEMALE: 72 mL/min/{1.73_m2} (ref >=60)
EGFR CKD-EPI NON-AA FEMALE: 62 mL/min/{1.73_m2} (ref >=60)
GLUCOSE RANDOM: 185 mg/dL — ABNORMAL HIGH (ref 70–179)
POTASSIUM: 3.7 mmol/L (ref 3.5–5.0)
PROTEIN TOTAL: 4.5 g/dL — ABNORMAL LOW (ref 6.5–8.3)
SODIUM: 140 mmol/L (ref 135–145)

## 2019-03-01 LAB — ANISOCYTOSIS

## 2019-03-01 LAB — PROTIME-INR: INR: 1.71

## 2019-03-01 LAB — MAGNESIUM: Magnesium:MCnc:Pt:Ser/Plas:Qn:: 1.9

## 2019-03-01 LAB — GLUCOSE RANDOM: Glucose:MCnc:Pt:Ser/Plas:Qn:: 185 — ABNORMAL HIGH

## 2019-03-01 LAB — PROTIME: Lab: 19.9 — ABNORMAL HIGH

## 2019-03-01 LAB — URINE CULTURE: Culture: >=100000 — AB

## 2019-03-01 NOTE — Unmapped (Signed)
HEPATOLOGY INPATIENT FOLLOW UP NOTE     Requesting Attending Physician:  Warren Lacy, MD    Reason for Consult:  Marilyn French is a 63 y.o. female seen in consultation at the request of Dr. Warren Lacy, MD for encephalopathy/anemia      ASSESSMENT:  63 y.o. year old female with NAFLD cirrhosis ([place]) with MELD score of 16 with PMH of ascites, HE and variceal bleed who underwent TIPS 06/2018 with subsequent worsening of HE on lactulose and rifaximin here with new onset anemia likely 2/2 GAVE and hepatic encephalopathy 2/2 TIPS +/- slow GIB. Other possible provoking factors for HE include lactulose non-compliance/constipation, infection, intravascular volume depletion (+ hyaline casts), electrolyte abnormalities. Patient does not seem to be actively bleeding at this time.    Recommend albumin administration, continued lactulose/rifaximin administration and likely EGD on Monday.    RECOMMENDATIONS:  - Albumin 25% 25g TID x 48 hrs  - Lactulose 20-30 gm q 2-4 hrs then titrate to 3-5 BMs per day  - Continue rifaximin 550 mg BID  - Nutrition goal >21 kcal/kg with 1.2-5 g/kg protein intake daily  - F/u pending cultures  - NPO on Sunday 6/14 at [facility] for likely EGD 6/15    Tawni Carnes, MD, MPH  Gastroenterology Fellow, PGY-6  Pager [phone]    Patient seen and discussed with Dr. Ruffin Frederick    Interval History:  - mental status clearing  - Hgb stable, no overt signs of GIB    Medications:  Current Facility-Administered Medications   Medication Dose Route Frequency Provider Last Rate Last Dose   ??? acetaminophen (TYLENOL) tablet 650 mg  650 mg Oral Q4H PRN Carrolyn Meiers, MD   650 mg at 03/01/19 0131   ??? atorvastatin (LIPITOR) tablet 10 mg  10 mg Oral Nightly Carrolyn Meiers, MD   10 mg at 02/28/19 2126   ??? busPIRone (BUSPAR) tablet 10 mg  10 mg Oral BID Carrolyn Meiers, MD   10 mg at 03/01/19 4540   ??? cholecalciferol (vitamin D3) tablet 1,000 Units  1,000 Units Oral Daily Carrolyn Meiers, MD   1,000 Units at 03/01/19 9811   ??? dextrose 50 % in water (D50W) 50 % solution 12.5 g  12.5 g Intravenous Q10 Min PRN Carrolyn Meiers, MD       ??? insulin glargine (LANTUS) injection 25 Units  25 Units Subcutaneous Nightly Yolanda Bonine, MD       ??? insulin lispro (HumaLOG) injection 0-12 Units  0-12 Units Subcutaneous ACHS Carrolyn Meiers, MD   6 Units at 03/01/19 1208   ??? lactulose (CHRONULAC) oral solution (30 mL cup)  20 g Oral 4x Daily Carrolyn Meiers, MD   20 g at 03/01/19 1154   ??? levothyroxine (SYNTHROID) tablet 25 mcg  25 mcg Oral daily Carrolyn Meiers, MD   25 mcg at 03/01/19 0539   ??? magnesium oxide (MAG-OX) tablet 800 mg  800 mg Oral Daily Carrolyn Meiers, MD   800 mg at 03/01/19 9147   ??? melatonin tablet 3 mg  3 mg Oral Nightly PRN Carrolyn Meiers, MD       ??? pantoprazole (PROTONIX) injection 40 mg  40 mg Intravenous BID Carrolyn Meiers, MD   40 mg at 03/01/19 0901   ??? potassium chloride (KLOR-CON) CR tablet 40 mEq  40 mEq Oral Daily Carrolyn Meiers, MD   40 mEq at 03/01/19 0903   ??? rifAXIMin (XIFAXAN) tablet 550  mg  550 mg Oral BID Carrolyn Meiers, MD   550 mg at 03/01/19 0981   ??? sertraline (ZOLOFT) tablet 50 mg  50 mg Oral Daily Carrolyn Meiers, MD   50 mg at 03/01/19 1914   ??? spironolactone (ALDACTONE) tablet 50 mg  50 mg Oral Daily Carrolyn Meiers, MD   50 mg at 03/01/19 7829   ??? torsemide (DEMADEX) tablet 20 mg  20 mg Oral Daily Carrolyn Meiers, MD   20 mg at 03/01/19 0904       Vital Signs:  Temp:  [35.5 ??C-35.9 ??C] 35.5 ??C  Heart Rate:  [72-79] 72  Resp:  [18-20] 20  BP: (118-128)/(49-59) 122/59  MAP (mmHg):  [78-87] 87  SpO2:  [95 %-100 %] 98 %    Intake/Output last 3 shifts:  I/O last 3 completed shifts:  In: 240 [P.O.:240]  Out: 300 [Urine:300]    Physical Exam:  Constitutional: Alert, Oriented x 3, No acute distress, well nourished and well hydrated.   CV: Regular rate and rhythm  Pulm: breathing comfortably  Abdomen: soft, normal bowel sounds, mild-distension, non-tender. No organomegaly. No ascites.   Skin: No rashes, jaundice or skin lesions noted  Neuro: No focal deficits.     Diagnostic Studies:   Labs:  Recent Labs     02/28/19  0754 03/01/19  0600   WBC 4.1* 3.6*   HGB 7.7* 7.2*   HCT 23.2* 21.8*   PLT 57* 61*     Recent Labs     02/28/19  0754 03/01/19  0600   NA 138 140   K 3.3* 3.7   CL 111* 112*   BUN 21 19   CREATININE 0.98 0.97   GLU 147 185*     Recent Labs     02/28/19  0754 03/01/19  0600   PROT 4.3* 4.5*   ALBUMIN 1.9* 2.0*   AST 30 28   ALT 20 19   ALKPHOS 64 63   BILITOT 2.6* 2.1*     Recent Labs     02/28/19  0754 03/01/19  0600   INR 1.58 1.71   APTT 26.2  --

## 2019-03-01 NOTE — Unmapped (Signed)
Daily Progress Note    24hr Events/Subjective: No acute events. Mental status continues to improve. Patient having some right-sided abdominal tenderness this morning after attempted diagnostic paracentesis yesterday. No fluid visualized on ultrasound. Low suspicion for infection so stopping antibiotics. Plan to continue to monitor hemoglobin and for EGD Monday 6/15.    Assessment/Plan:    Marilyn French is a 63 y.o. female with a hx of NAFLD cirrhosis c/b hepatic encephalopathy and esophageal varices s/p TIPS with chronic blood loss anemia, HFpEF, T2DM, who was recently discharged 6/9 from OSH after receiving 3u pRBCs d/t acute blood loss anemia. Returned to OSH the next day due to progressively altered mental status as reported by her husband and was transferred to [facility] 6/12 for further evaluation and treatment.   ??  Principal Problem:    Hepatic encephalopathy (CMS-HCC)  Active Problems:    NAFLD (nonalcoholic fatty liver disease)    Cirrhosis (CMS-HCC)    GAVE (gastric antral vascular ectasia)    Hypothyroid    Chronic blood loss anemia  ??  ??  Nonalcoholic cirrhosis c/b hepatic encephalopathy Patient with altered mental status in the context of acute-on-chronic blood loss anemia w/ known esophageal varices/GAVE. She has been receiving transfusions almost weekly. Altered mental status likely triggered by ongoing UGIB, hyperammonemia especially given patient is s/p TIPS 06/2018. Major variceal bleed unlikely given hemodynamic stability and appropriate response to transfusion at OSH. CXR with no acute airspace opacities, stable cardiomegaly. Infectious workup negative to this point and no fluid on u/s for diagnostic paracentesis. Patient is currently being worked up at [facility] for liver transplant. Most recent MELD is 15.   - IV PPI  - continue spironolactone, torsemide, lactulose, rifaximin-titrate lactulose to 3-5 BM daily  - daily CBC, INR  - giving albumin 25g TIB x48 hours  - stopping empiric abx given no signs or symptoms of infection and no ascites  - Hepatology consulted, appreciate recs  - EGD 6/15    Acute on chronic iron deficiency anemia secondary to chronic blood loss secondary to known portal hypertension and esophageal varices s/p multiple bands, GAVE requiring APC with limited benefit. On 6/8 her hemoglobin was routinely checked and noted to be 4.8 from a recent prior of 6.4 with a baseline around 7. She was admitted to OSH given 3u PRBCs, follow up Hgb 7.9, discharged the following day (6/9). At OSH 6/11, Hgb 6.6. Given 1u, f/u Hgb 8.0 prior to transfer. Low concern for acute bleed given appropriate response to transfusion, known chronic blood loss anemia requiring weekly transfusions, and hemodynamic stability. Hgb 7.2 this am  - Trend CBC  - Maintain active T&S  - Plan for EGD 6/15  - GI consulted, appreciate recs  - transfuse for Hgb <7    HFpEF TTE 11/27/16 EF 60-65% with normal systolic function, LVH, mild MR and mild LA dilation. VSS, Patient appears euvolemic on exam, CXR with stable cardiomegaly   - continue home diuresis  - daily weights  - monitor I&Os   ??  Chronic, Stable Medical Conditions  Hypothyroidism: levothyroxine 25 mcg  T2DM: on lantus 40u nightly + SSI at home. Lantus 25u + SSI currently.  Thrombocytopenia: stable at baseline   ??  F: albumin today  E: replete PRN  N: Regular diet  ??  GI PPx: PPI  DVT PPx: SCDs  ??  Code Status: Full Code, though patient likely without capacity to make such decisions at this time.  Identified her husband and daughter as preferred surrogate  decision makers.  ??  Disposition: Floor status  ___________________________________________________________________    Labs/Studies:  Labs and Studies from the last 24hrs per EMR and Reviewed            Objective:  BP 122/59  - Pulse 72  - Temp 35.5 ??C (95.9 ??F) (Oral)  - Resp 20  - Ht 157.5 cm (5' 2.01)  - Wt 93 kg (205 lb)  - LMP  (LMP Unknown)  - SpO2 98%  - BMI 37.49 kg/m??     GEN: Well-appearing, NAD, Oriented to self and place, knows month with some hesitation, oriented to situation, improved orientation from yesterday  HEENT: mmm, EOMI, PERRLA  CV: RRR, no murmurs appreciated  PULM: Normal WOB on RA  ABD: Soft, mild right-sided tenderness, normal bowel sounds  EXT: No edema    Darrel Hoover, MS4     I attest that I have reviewed the student note and that the components of the history of the present illness, the physical exam, and the assessment and plan documented were performed by me or were performed in my presence by the student where I verified the documentation and performed (or re-performed) the exam and medical decision making.      Donah Driver, MD  PGY-1 - Internal Medicine  Contact [phone]

## 2019-03-01 NOTE — Unmapped (Signed)
HEPATOLOGY INPATIENT CONSULTATION H&P      Requesting Attending Physician:  Warren Lacy, MD  Requesting Consult Service: MDW    Reason for Consult:    Marilyn French is a 63 y.o. female seen in consultation at the request of Dr. Warren Lacy, MD for evaluation of encephalopathy/anemia    Assessment and Recommendations:     63 y.o. year old female with NAFLD cirrhosis ([place]) with MELD score of 16 with PMH of ascites, HE and variceal bleed who underwent TIPS 06/2018 with subsequent worsening of HE on lactulose and rifaximin here with new onset anemia likely 2/2 GAVE and hepatic encephalopathy 2/2 TIPS +/- slow GIB. Other possible provoking factors for HE include lactulose non-compliance/constipation, infection, intravascular volume depletion (+ hyaline casts), electrolyte abnormalities. Patient does not seem to be actively bleeding at this time.    Recommend aggressive treatment of HE and consideration of EGD on Monday 6/15 (if still inpatient) vs as outpatient.    Recommend:   - Agree with albumin administration for volume repletion  - Continue IV PPI for now and monitor for clinically apparent GIB  - Lactulose 20-30 gm q 2-4 hrs then titrate to 3-5 BMs per day  - Continue rifaximin 550 mg BID  - Perform diagnostic paracentesis (send for cell count and diff, bacterial cx and gm stain, albumin, and glucose).  - Check U/A, urine cx, and blood cx.    - Reasonable to consider continuing CTX for now; can stop if no infectious sx and neg cx    Tawni Carnes, MD, MPH  Gastroenterology Fellow, PGY-6  Pager [phone]    Patient seen and discussed with Dr. Ruffin Frederick    HPI:     Chief Complaint: encephalopathy/anemia    History of Present Illness:   This is a 63 y.o. year old female with NAFLD cirrhosis ([place]) with MELD score of 16 with PMH of ascites, HE and variceal bleed who underwent TIPS 06/2018 with subsequent worsening of HE on lactulose and rifaximin. She has since been diagnosed with a chronic main portal vein thrombosis not being anticoagulated. Recently she has had chronic oozing from her GAVE requiring transfusions of 1-2 units pRBCs every 1-2 weeks. She presented to PCP to assess labs on Monday 6/8 and was found to have a Hgb 4.8. She was admitted to OSH where she was given 3U pRBCs with appropriate response in Hgb to 7.9 and no clinical signs of bleeding. She was discharged the following day but presented back on 6/10 with confusion in the setting of not taking lactulose. She was readmitted with admission Hgb 7.4->6.6. Other labs notable for Na 141, potassium 3.8, chloride 113, calcium 8.2, glucose 132, BUN 26 and creatinine 0.99. Received a transfusion prior to transfer and was started on CTX.    At arrival she was A&Ox1.     TIPS doppler 10/2018 with patent TIPS  MRI 02/21/2019 with no hepatic masses and near-occlusive thrombus within the main portal vein proximal to the bifurcation    Review of Systems:  The balance of 12 systems reviewed is negative except as noted in the HPI.     Medical History:     Past Medical History:  Past Medical History:   Diagnosis Date   ??? Cirrhosis (CMS-HCC)    ??? Depression    ??? Diabetes mellitus (CMS-HCC)    ??? Fibromyalgia    ??? GAVE (gastric antral vascular ectasia)    ??? HTN (hypertension)    ??? Hypercholesterolemia    ???  Hypothyroid    ??? NAFLD (nonalcoholic fatty liver disease)    ??? Osteoporosis        Surgical History:  Past Surgical History:   Procedure Laterality Date   ??? APPENDECTOMY     ??? CHOLECYSTECTOMY     ??? IR TIPS  07/08/2018    IR TIPS 07/08/2018 Soledad Gerlach, MD IMG VIR [facility]   ??? PR RIGHT HEART CATH O2 SATURATION & CARDIAC OUTPUT Right 01/18/2018    Procedure: Right Heart Catheterization;  Surgeon: Marlaine Hind, MD;  Location: [facility] CATH;  Service: Cardiology   ??? SALPINGOOPHORECTOMY         Family History:  The patient's family history is not on file..    Medications:   Current Facility-Administered Medications   Medication Dose Route Frequency Provider Last Rate Last Dose   ??? acetaminophen (TYLENOL) tablet 650 mg  650 mg Oral Q4H PRN Carrolyn Meiers, MD       ??? atorvastatin (LIPITOR) tablet 10 mg  10 mg Oral Nightly Carrolyn Meiers, MD       ??? busPIRone (BUSPAR) tablet 10 mg  10 mg Oral BID Carrolyn Meiers, MD   10 mg at 02/28/19 0848   ??? cefTRIAXone (ROCEPHIN) 1 g in sodium chloride 0.9 % (NS) 100 mL IVPB-connector bag  1 g Intravenous Q24H [facility] Yolanda Bonine, MD 200 mL/hr at 02/28/19 1515 1 g at 02/28/19 1515   ??? cholecalciferol (vitamin D3) tablet 1,000 Units  1,000 Units Oral Daily Carrolyn Meiers, MD   1,000 Units at 02/28/19 0848   ??? dextrose 50 % in water (D50W) 50 % solution 12.5 g  12.5 g Intravenous Q10 Min PRN Carrolyn Meiers, MD       ??? insulin glargine (LANTUS) injection 20 Units  20 Units Subcutaneous Nightly Carrolyn Meiers, MD       ??? insulin lispro (HumaLOG) injection 0-12 Units  0-12 Units Subcutaneous ACHS Carrolyn Meiers, MD   4 Units at 02/28/19 1704   ??? lactulose (CHRONULAC) oral solution (30 mL cup)  20 g Oral 4x Daily Carrolyn Meiers, MD   Stopped at 02/28/19 1703   ??? levothyroxine (SYNTHROID) tablet 25 mcg  25 mcg Oral daily Carrolyn Meiers, MD   25 mcg at 02/28/19 0536   ??? magnesium oxide (MAG-OX) tablet 800 mg  800 mg Oral Daily Carrolyn Meiers, MD   800 mg at 02/28/19 0848   ??? melatonin tablet 3 mg  3 mg Oral Nightly PRN Carrolyn Meiers, MD       ??? pantoprazole (PROTONIX) injection 40 mg  40 mg Intravenous BID Carrolyn Meiers, MD   40 mg at 02/28/19 0845   ??? potassium chloride (KLOR-CON) CR tablet 40 mEq  40 mEq Oral Daily Carrolyn Meiers, MD   40 mEq at 02/28/19 0849   ??? rifAXIMin (XIFAXAN) tablet 550 mg  550 mg Oral BID Carrolyn Meiers, MD   550 mg at 02/28/19 0848   ??? sertraline (ZOLOFT) tablet 50 mg  50 mg Oral Daily Carrolyn Meiers, MD   50 mg at 02/28/19 0848   ??? spironolactone (ALDACTONE) tablet 50 mg  50 mg Oral Daily Carrolyn Meiers, MD   50 mg at 02/28/19 0848   ??? torsemide (DEMADEX) tablet 20 mg  20 mg Oral Daily Carrolyn Meiers, MD   20 mg at 02/28/19 0848 Allergies:  Ferumoxytol and Tramadol hcl    Social  History:  Social History     Tobacco Use   ??? Smoking status: Never Smoker   ??? Smokeless tobacco: Never Used   Substance Use Topics   ??? Alcohol use: No   ??? Drug use: No       Objective:      Vital Signs:  Temp:  [35.4 ??C-36.1 ??C] 35.4 ??C  Heart Rate:  [76-80] 76  Resp:  [18] 18  BP: (135-156)/(56-69) 156/69  SpO2:  [97 %-99 %] 99 %  BMI (Calculated):  [37.49] 37.49    Physical Exam:  Constitutional: slowed mentation, A&Ox2  CV: Regular rate and rhythm  Pulm: breathing comfortably  Abdomen: soft, normal bowel sounds, non-distended, non-tender. No organomegaly. No ascites.   Skin: No rashes, jaundice or skin lesions noted  Neuro: No focal deficits.     Diagnostic Studies:  I reviewed all pertinent diagnostic studies, including:      Labs:    Recent Labs     02/28/19  0754   WBC 4.1*   HGB 7.7*   HCT 23.2*   PLT 57*     Recent Labs     02/28/19  0754   NA 138   K 3.3*   CL 111*   BUN 21   CREATININE 0.98   GLU 147     Recent Labs     02/28/19  0754   PROT 4.3*   ALBUMIN 1.9*   AST 30   ALT 20   ALKPHOS 64   BILITOT 2.6*     Recent Labs     02/28/19  0754   INR 1.58   APTT 26.2     @LABRCNT (CRP:3)    Imaging:   Radiology studies were personally reviewed     Microbiology:    GI Procedures:

## 2019-03-01 NOTE — Unmapped (Signed)
Pt is AOX2-3, VSS on RA. She c/o headache managed with prn tylenol with good relief. She voiced no concerns. BS monitored and treated as per orders. Pt needs assistance with meal ordering meals and minimum assistance with ADLs. Fall precautions in place. Bedside table and call bell within reach. Will continue with plan of care.    Problem: Adult Inpatient Plan of Care  Goal: Plan of Care Review  Flowsheets (Taken 03/01/2019 0045)  Plan of Care Reviewed With: patient  Goal: Patient-Specific Goal (Individualization)  Flowsheets (Taken 03/01/2019 0045)  Patient-Specific Goals (Include Timeframe): Pt will remain falls free during her stay in the hospital  Individualized Care Needs: Fall precaution, ACHS, Re-Orientation, VS/Labs  Anxieties, Fears or Concerns: none voiced  Goal: Absence of Hospital-Acquired Illness or Injury  Intervention: Identify and Manage Fall Risk  Flowsheets (Taken 03/01/2019 0045)  Safety Interventions: fall reduction program maintained  Intervention: Prevent Skin Injury  Flowsheets (Taken 03/01/2019 0045)  Pressure Reduction Techniques: frequent weight shift encouraged  Intervention: Prevent VTE (venous thromboembolism)  Flowsheets (Taken 03/01/2019 0045)  VTE Prevention/Management: anticoagulation therapy  Intervention: Prevent Infection  Flowsheets (Taken 03/01/2019 0045)  Infection Prevention: handwashing promoted  Goal: Optimal Comfort and Wellbeing  Intervention: Monitor Pain and Promote Comfort  Flowsheets (Taken 03/01/2019 0045)  Pain Management Interventions:  ??? care clustered  ??? quiet environment facilitated  Intervention: Provide Person-Centered Care  Flowsheets (Taken 03/01/2019 0045)  Trust Relationship/Rapport:  ??? care explained  ??? choices provided  ??? questions answered  ??? questions encouraged     Problem: Fall Injury Risk  Goal: Absence of Fall and Fall-Related Injury  Intervention: Identify and Manage Contributors to Fall Injury Risk  Flowsheets (Taken 03/01/2019 0045)  Self-Care Promotion: ??? independence encouraged  ??? BADL personal objects within reach  Intervention: Promote Injury-Free Environment  Flowsheets (Taken 03/01/2019 0045)  Safety Interventions: fall reduction program maintained  Environmental Safety Modification:  ??? clutter free environment maintained  ??? lighting adjusted     Problem: Anemia  Goal: Anemia Symptom Improvement  Intervention: Monitor and Manage Anemia  Flowsheets (Taken 03/01/2019 0045)  Fatigue Management: activity schedule adjusted

## 2019-03-01 NOTE — Unmapped (Signed)
Patient is alert and oriented x2/3. She stated that she gets confused and forgetful at times. Call bell with in reach. Bed alarm is activated. No signs of distress. Gave her albumin supplement today. Noticed abdomen distended with ascites and lower extremity edema. SCDs are on for VTE. Pt had 2 BMs thus far. She remains with out falls. Ate all her meals. Blood sugar between 180-280. Managed with SS insulin .  And Lantus. Sent COVID -19 - Asymptomatic Screening test prior to a procedure on Monday. Will continue with plan of care.   Problem: Adult Inpatient Plan of Care  Goal: Plan of Care Review  Outcome: Progressing  Flowsheets (Taken 03/01/2019 1515)  Progress: improving  Plan of Care Reviewed With: patient  Goal: Patient-Specific Goal (Individualization)  Outcome: Progressing  Flowsheets (Taken 03/01/2019 1515)  Patient-Specific Goals (Include Timeframe): Pt will remain falls free during her stay in the hospital.  Individualized Care Needs: Fall precaution, ACHS, Re-Orientation, VS/Labs  Anxieties, Fears or Concerns: Voiced no concerns.  Goal: Absence of Hospital-Acquired Illness or Injury  Outcome: Progressing  Intervention: Identify and Manage Fall Risk  Flowsheets (Taken 03/01/2019 1515)  Safety Interventions: fall reduction program maintained  Intervention: Prevent Skin Injury  Flowsheets (Taken 03/01/2019 1515)  Pressure Reduction Techniques: frequent weight shift encouraged  Intervention: Prevent VTE (venous thromboembolism)  Flowsheets (Taken 03/01/2019 1515)  VTE Prevention/Management:  ??? ambulation promoted  ??? anticoagulation therapy  Intervention: Prevent Infection  Flowsheets (Taken 03/01/2019 1515)  Infection Prevention: environmental surveillance performed  Goal: Optimal Comfort and Wellbeing  Outcome: Progressing  Intervention: Monitor Pain and Promote Comfort  Flowsheets (Taken 03/01/2019 1515)  Pain Management Interventions:  ??? care clustered  ??? quiet environment facilitated  Intervention: Provide Person-Centered Care  Flowsheets (Taken 03/01/2019 1515)  Trust Relationship/Rapport:  ??? care explained  ??? questions answered  Goal: Readiness for Transition of Care  Outcome: Progressing  Intervention: Mutually Develop Transition Plan  Flowsheets (Taken 03/01/2019 1515)  Concerns to be Addressed: care coordination/care conferences  Readmission Within the Last 30 Days: no previous admission in last 30 days  Goal: Rounds/Family Conference  Outcome: Progressing  Flowsheets (Taken 03/01/2019 1515)  Participants:  ??? case manager  ??? nursing  ??? physician     Problem: Fall Injury Risk  Goal: Absence of Fall and Fall-Related Injury  Outcome: Progressing  Intervention: Identify and Manage Contributors to Fall Injury Risk  Flowsheets (Taken 03/01/2019 1515)  Medication Review/Management: medications reviewed  Self-Care Promotion:  ??? independence encouraged  ??? BADL personal objects within reach  Intervention: Promote Injury-Free Environment  Flowsheets (Taken 03/01/2019 1515)  Safety Interventions: fall reduction program maintained     Problem: Anemia  Goal: Anemia Symptom Improvement  Outcome: Progressing  Intervention: Monitor and Manage Anemia  Flowsheets (Taken 03/01/2019 1515)  Fatigue Management: activity schedule adjusted     Problem: Hypertension Comorbidity  Goal: Blood Pressure in Desired Range  Outcome: Progressing  Intervention: Maintain Hypertension-Management Strategies  Flowsheets (Taken 03/01/2019 1515)  Medication Review/Management: medications reviewed     Problem: Self-Care Deficit  Goal: Improved Ability to Complete Activities of Daily Living  Outcome: Progressing  Intervention: Promote Activity and Functional Independence  Flowsheets (Taken 03/01/2019 1515)  Activity Assistance Provided: assistance, 1 person  Self-Care Promotion:  ??? independence encouraged  ??? BADL personal objects within reach

## 2019-03-02 LAB — ANISOCYTOSIS

## 2019-03-02 LAB — CBC W/ AUTO DIFF
BASOPHILS ABSOLUTE COUNT: 0 10*9/L (ref 0.0–0.1)
BASOPHILS ABSOLUTE COUNT: 0 10*9/L (ref 0.0–0.1)
BASOPHILS RELATIVE PERCENT: 0.6 %
BASOPHILS RELATIVE PERCENT: 0.9 %
EOSINOPHILS ABSOLUTE COUNT: 0 10*9/L (ref 0.0–0.4)
EOSINOPHILS ABSOLUTE COUNT: 0.1 10*9/L (ref 0.0–0.4)
EOSINOPHILS RELATIVE PERCENT: 2.1 %
EOSINOPHILS RELATIVE PERCENT: 1.8 %
HEMATOCRIT: 17.9 % — ABNORMAL LOW (ref 36.0–46.0)
HEMATOCRIT: 23.3 % — ABNORMAL LOW (ref 36.0–46.0)
HEMOGLOBIN: 5.7 g/dL — ABNORMAL LOW (ref 12.0–16.0)
HEMOGLOBIN: 7.4 g/dL — ABNORMAL LOW (ref 12.0–16.0)
LARGE UNSTAINED CELLS: 5 % — ABNORMAL HIGH (ref 0–4)
LARGE UNSTAINED CELLS: 5 % — ABNORMAL HIGH (ref 0–4)
LYMPHOCYTES ABSOLUTE COUNT: 0.5 10*9/L — ABNORMAL LOW (ref 1.5–5.0)
LYMPHOCYTES ABSOLUTE COUNT: 0.7 10*9/L — ABNORMAL LOW (ref 1.5–5.0)
LYMPHOCYTES RELATIVE PERCENT: 22.8 %
LYMPHOCYTES RELATIVE PERCENT: 25.1 %
MEAN CORPUSCULAR HEMOGLOBIN CONC: 31.9 g/dL (ref 31.0–37.0)
MEAN CORPUSCULAR HEMOGLOBIN: 28.8 pg (ref 26.0–34.0)
MEAN CORPUSCULAR VOLUME: 89.8 fL (ref 80.0–100.0)
MEAN PLATELET VOLUME: 12.4 fL — ABNORMAL HIGH (ref 7.0–10.0)
MONOCYTES ABSOLUTE COUNT: 0.2 10*9/L (ref 0.2–0.8)
MONOCYTES RELATIVE PERCENT: 7.9 %
MONOCYTES RELATIVE PERCENT: 8.9 %
NEUTROPHILS ABSOLUTE COUNT: 1.3 10*9/L — ABNORMAL LOW (ref 2.0–7.5)
NEUTROPHILS ABSOLUTE COUNT: 1.5 10*9/L — ABNORMAL LOW (ref 2.0–7.5)
NEUTROPHILS RELATIVE PERCENT: 61.8 %
NEUTROPHILS RELATIVE PERCENT: 58.6 %
PLATELET COUNT: 52 10*9/L — ABNORMAL LOW (ref 150–440)
PLATELET COUNT: 49 10*9/L — ABNORMAL LOW (ref 150–440)
RED BLOOD CELL COUNT: 1.99 10*12/L — ABNORMAL LOW (ref 4.00–5.20)
RED BLOOD CELL COUNT: 2.57 10*12/L — ABNORMAL LOW (ref 4.00–5.20)
RED CELL DISTRIBUTION WIDTH: 23 % — ABNORMAL HIGH (ref 12.0–15.0)
RED CELL DISTRIBUTION WIDTH: 21.1 % — ABNORMAL HIGH (ref 12.0–15.0)
WBC ADJUSTED: 2.1 10*9/L — ABNORMAL LOW (ref 4.5–11.0)
WBC ADJUSTED: 2.6 10*9/L — ABNORMAL LOW (ref 4.5–11.0)

## 2019-03-02 LAB — COMPREHENSIVE METABOLIC PANEL
ALBUMIN: 2.8 g/dL — ABNORMAL LOW (ref 3.5–5.0)
ALKALINE PHOSPHATASE: 56 U/L (ref 38–126)
ALT (SGPT): 15 U/L (ref <35)
ANION GAP: 8 mmol/L (ref 7–15)
AST (SGOT): 24 U/L (ref 14–38)
BILIRUBIN TOTAL: 1.7 mg/dL — ABNORMAL HIGH (ref 0.0–1.2)
BUN / CREAT RATIO: 18
CALCIUM: 8.3 mg/dL — ABNORMAL LOW (ref 8.5–10.2)
CHLORIDE: 111 mmol/L — ABNORMAL HIGH (ref 98–107)
CO2: 21 mmol/L — ABNORMAL LOW (ref 22.0–30.0)
CREATININE: 1.04 mg/dL — ABNORMAL HIGH (ref 0.60–1.00)
EGFR CKD-EPI AA FEMALE: 66 mL/min/{1.73_m2} (ref >=60)
GLUCOSE RANDOM: 189 mg/dL — ABNORMAL HIGH (ref 70–179)
PROTEIN TOTAL: 4.9 g/dL — ABNORMAL LOW (ref 6.5–8.3)
SODIUM: 140 mmol/L (ref 135–145)

## 2019-03-02 LAB — SMEAR REVIEW

## 2019-03-02 LAB — MAGNESIUM: Magnesium:MCnc:Pt:Ser/Plas:Qn:: 1.7

## 2019-03-02 LAB — VARIABLE HEMOGLOBIN CONCENTRATION

## 2019-03-02 LAB — SODIUM: Sodium:SCnc:Pt:Ser/Plas:Qn:: 140

## 2019-03-02 LAB — PROTIME: Lab: 21.1 — ABNORMAL HIGH

## 2019-03-02 NOTE — Unmapped (Signed)
Pt is AOX2-3, VSS on RA. She denied any pain- remains asymptomatic of COVID-19 symptoms. BS monitored and treated as per orders. Pt needs assistance with meal ordering meals and minimum assistance with ADLs. Fall precautions in place. Bedside table and call bell within reach. Will continue with plan of care.    Problem: Adult Inpatient Plan of Care  Goal: Plan of Care Review  Flowsheets (Taken 03/02/2019 0205)  Plan of Care Reviewed With: patient  Goal: Patient-Specific Goal (Individualization)  Flowsheets (Taken 03/02/2019 0205)  Patient-Specific Goals (Include Timeframe): Pt will remain falls free through this shift  Individualized Care Needs: ACHS, VS/labs, falls prevention  Anxieties, Fears or Concerns: none voiced  Goal: Absence of Hospital-Acquired Illness or Injury  Intervention: Identify and Manage Fall Risk  Flowsheets (Taken 03/02/2019 0205)  Safety Interventions: fall reduction program maintained  Intervention: Prevent Skin Injury  Flowsheets (Taken 03/02/2019 0205)  Pressure Reduction Techniques: frequent weight shift encouraged  Intervention: Prevent VTE (venous thromboembolism)  Flowsheets (Taken 03/02/2019 0205)  VTE Prevention/Management:  ??? anticoagulation therapy  ??? ambulation promoted  Intervention: Prevent Infection  Flowsheets (Taken 03/02/2019 0205)  Infection Prevention: handwashing promoted  Goal: Optimal Comfort and Wellbeing  Intervention: Monitor Pain and Promote Comfort  Flowsheets (Taken 03/02/2019 0205)  Pain Management Interventions:  ??? care clustered  ??? quiet environment facilitated  Intervention: Provide Person-Centered Care  Flowsheets (Taken 03/02/2019 0205)  Trust Relationship/Rapport:  ??? care explained  ??? choices provided  ??? questions answered  ??? questions encouraged     Problem: Fall Injury Risk  Goal: Absence of Fall and Fall-Related Injury  Intervention: Identify and Manage Contributors to Fall Injury Risk  Flowsheets (Taken 03/02/2019 0205)  Self-Care Promotion:  ??? independence encouraged  ??? BADL personal objects within reach  Intervention: Promote Injury-Free Environment  Flowsheets (Taken 03/02/2019 0205)  Safety Interventions: fall reduction program maintained  Environmental Safety Modification:  ??? clutter free environment maintained  ??? lighting adjusted     Problem: Anemia  Goal: Anemia Symptom Improvement  Intervention: Monitor and Manage Anemia  Flowsheets (Taken 03/02/2019 0205)  Fatigue Management: activity assistance provided     Problem: Self-Care Deficit  Goal: Improved Ability to Complete Activities of Daily Living  Intervention: Promote Activity and Functional Independence  Flowsheets (Taken 03/02/2019 0205)  Activity Assistance Provided: assistance, 1 person  Self-Care Promotion:  ??? independence encouraged  ??? BADL personal objects within reach

## 2019-03-02 NOTE — Unmapped (Signed)
Internal Medicine (MDW) Progress Note    Interval History:    No acute events overnight. Patient is well appearing, and mental status improved. She is however complaining of more weakness and fatigue that started last night, attributing it to her knowledge of how she is when her blood count drops. Her hemoglobin this morning was 5.7, down from 7.2 today. We will transfuse her two units and start ceftriaxone back. Awaiting COVID test for pre-procedure screening, and plan is to have EGD done tomorrow.     Assessment/Plan:  Principal Problem:    Hepatic encephalopathy (CMS-HCC)  Active Problems:    NAFLD (nonalcoholic fatty liver disease)    Cirrhosis (CMS-HCC)    GAVE (gastric antral vascular ectasia)    Hypothyroid    Chronic blood loss anemia  Resolved Problems:    * No resolved hospital problems. *      Marilyn French is a 63 y.o. female with a PMHx of NAFLD??cirrhosis c/b hepatic encephalopathy and esophageal varices s/p TIPS??with chronic blood loss anemia, HFpEF,??T2DM, who was recently discharged 6/9 from OSH after receiving 3u pRBCs d/t acute blood loss anemia.??Returned to OSH the next day??due to progressively altered mental status as reported by her husband and was transferred to [facility] 6/12 for further evaluation and treatment.??  ??  Nonalcoholic cirrhosis??c/b hepatic encephalopathy: Patient with AMS in the context of acute-on-chronic blood loss anemia w/ known esophageal varices/GAVE.??She has been receiving transfusions almost weekly.??Altered Mental Status likely triggered by ongoing UGIB, hyperammonemia especially given patient is s/p TIPS 06/2018. Major variceal bleed unlikely given hemodynamic stability??and appropriate response to transfusion at OSH.??Chest xray with no acute airspace opacities, stable cardiomegaly. Infectious workup negative to this point and no fluid on u/s for diagnostic paracentesis.??Patient is currently being worked up at [facility] for liver [facility].  - continue spironolactone, torsemide,??lactulose,??rifaximin-titrate lactulose to 3-5 BM daily  - giving albumin 25g TID x48 hours  - empiric treatment stopped as no signs of infection  - EGD tomorrow    Acute on chronic iron deficiency anemia secondary to chronic blood loss??secondary to known portal hypertension and esophageal varices??s/p multiple bands, GAVE??requiring APC??with limited benefit.??On 6/8 her hemoglobin was routinely checked and noted to be 4.8 from a??recent??prior of 6.4 with a baseline around 7. She was admitted??to OSH??given 3u PRBCs, follow up Hgb 7.9, discharged the following day (6/9).??At OSH 6/11, Hgb 6.6. Given 1u, f/u Hgb 8.0 prior to transfer. Low concern for acute bleed given appropriate response to transfusion, known chronic blood loss anemia requiring weekly transfusions,??and hemodynamic stability.   Hemoglobin was 5.7 this morning down from 7.2.   - transfuse 2 units today  - re-initiate ceftriaxone 1 g qdaily(prophylactically in setting of UGIB)  - monitor hemoglobin (changing CBC to q12)  - active type and screen, transfuse hgb if less than 7  - NPO after MN for EGD  - GI following     HFpEF TTE 11/27/16 EF 60-65% with normal systolic function, LVH, mild MR and mild LA dilation.??VSS,??Patient appears euvolemic??on exam, chest xray with stable cardiomegaly   - continue home diuretics  - daily weights and monitor I&O    Chronic Medical Conditions:  Hypothyroidism:?? continue home levothyroxine 25 mcg  T2DM:??on lantus 40u nightly + SSI at home.??Lantus 30 units and sliding scale right now. Blood sugars relatively uncontrolled at 290s this AM, but now given she will be NPO tonight, would just leave at 30 units for tonight, and increase near home dosing tomorrow.  Thrombocytopenia: stable at baseline  Daily Checklist:  Diet: regular  DVT PPx: scds  GI PPx: ppi  Electrolytes: Replete PRN  Code Status: Full Code    Dispo: floor, EGD tomorrow, home?  ___________________________________________________________________ Labs/Studies:  Labs and studies from the last 24 hours reviewed.    Objective:  Temp:  [35.5 ??C-35.7 ??C] 35.7 ??C  Heart Rate:  [72-83] 83  Resp:  [18-20] 18  BP: (118-124)/(36-59) 124/36  SpO2:  [95 %-98 %] 95 %    GEN: NAD, alert, oriented, answers questions appropriately  CV: RRR, S1, S2, no M/R/G  RESP: CTAB, no wheezes or crackles  ABD: Normoactive bowel sounds, soft, NTND, no rebound/guarding  EXT: No clubbing, cyanosis, or edema  SKIN:  No rashes or lesions noted    Cherie Dark, DO

## 2019-03-02 NOTE — Unmapped (Signed)
Order was placed for a PIV by Venous Access Team (VAT).  Patient was assessed for placement of a PIV. Access was obtained. Blood return noted.  Dressing intact and device well secured.  Flushed with normal saline.  Pt advised to inform RN of any s/s of discomfort at the PIV site.    Workup / Procedure Time:  15 minutes       Orna, RN was notified.       Thank you,     Ashley Akin RN Venous Access Team

## 2019-03-03 DIAGNOSIS — K7290 Hepatic failure, unspecified without coma: Principal | ICD-10-CM

## 2019-03-03 LAB — CBC W/ AUTO DIFF
BASOPHILS ABSOLUTE COUNT: 0 10*9/L (ref 0.0–0.1)
BASOPHILS ABSOLUTE COUNT: 0 10*9/L (ref 0.0–0.1)
BASOPHILS RELATIVE PERCENT: 1 %
BASOPHILS RELATIVE PERCENT: 0.6 %
EOSINOPHILS ABSOLUTE COUNT: 0.1 10*9/L (ref 0.0–0.4)
EOSINOPHILS RELATIVE PERCENT: 2.1 %
EOSINOPHILS RELATIVE PERCENT: 1.5 %
HEMATOCRIT: 20.7 % — ABNORMAL LOW (ref 36.0–46.0)
HEMOGLOBIN: 6.6 g/dL — ABNORMAL LOW (ref 12.0–16.0)
LARGE UNSTAINED CELLS: 5 % — ABNORMAL HIGH (ref 0–4)
LARGE UNSTAINED CELLS: 5 % — ABNORMAL HIGH (ref 0–4)
LYMPHOCYTES ABSOLUTE COUNT: 0.5 10*9/L — ABNORMAL LOW (ref 1.5–5.0)
LYMPHOCYTES ABSOLUTE COUNT: 0.7 10*9/L — ABNORMAL LOW (ref 1.5–5.0)
LYMPHOCYTES RELATIVE PERCENT: 23.4 %
LYMPHOCYTES RELATIVE PERCENT: 22.4 %
MEAN CORPUSCULAR HEMOGLOBIN CONC: 32 g/dL (ref 31.0–37.0)
MEAN CORPUSCULAR HEMOGLOBIN CONC: 33 g/dL (ref 31.0–37.0)
MEAN CORPUSCULAR HEMOGLOBIN: 28.7 pg (ref 26.0–34.0)
MEAN CORPUSCULAR HEMOGLOBIN: 29.5 pg (ref 26.0–34.0)
MEAN CORPUSCULAR VOLUME: 89.8 fL (ref 80.0–100.0)
MEAN CORPUSCULAR VOLUME: 89.2 fL (ref 80.0–100.0)
MEAN PLATELET VOLUME: 13.3 fL — ABNORMAL HIGH (ref 7.0–10.0)
MEAN PLATELET VOLUME: 12.2 fL — ABNORMAL HIGH (ref 7.0–10.0)
MONOCYTES ABSOLUTE COUNT: 0.2 10*9/L (ref 0.2–0.8)
MONOCYTES ABSOLUTE COUNT: 0.3 10*9/L (ref 0.2–0.8)
MONOCYTES RELATIVE PERCENT: 8.7 %
MONOCYTES RELATIVE PERCENT: 8.3 %
NEUTROPHILS ABSOLUTE COUNT: 2 10*9/L (ref 2.0–7.5)
NEUTROPHILS RELATIVE PERCENT: 60.2 %
NEUTROPHILS RELATIVE PERCENT: 62.3 %
PLATELET COUNT: 46 10*9/L — ABNORMAL LOW (ref 150–440)
PLATELET COUNT: 45 10*9/L — ABNORMAL LOW (ref 150–440)
RED BLOOD CELL COUNT: 2.3 10*12/L — ABNORMAL LOW (ref 4.00–5.20)
RED BLOOD CELL COUNT: 3.11 10*12/L — ABNORMAL LOW (ref 4.00–5.20)
RED CELL DISTRIBUTION WIDTH: 21 % — ABNORMAL HIGH (ref 12.0–15.0)
RED CELL DISTRIBUTION WIDTH: 21.3 % — ABNORMAL HIGH (ref 12.0–15.0)
WBC ADJUSTED: 2.2 10*9/L — ABNORMAL LOW (ref 4.5–11.0)
WBC ADJUSTED: 3.2 10*9/L — ABNORMAL LOW (ref 4.5–11.0)

## 2019-03-03 LAB — COMPREHENSIVE METABOLIC PANEL
ALBUMIN: 2.7 g/dL — ABNORMAL LOW (ref 3.5–5.0)
ALKALINE PHOSPHATASE: 46 U/L (ref 38–126)
ALT (SGPT): 14 U/L (ref <35)
ANION GAP: 7 mmol/L (ref 7–15)
AST (SGOT): 26 U/L (ref 14–38)
BILIRUBIN TOTAL: 1.8 mg/dL — ABNORMAL HIGH (ref 0.0–1.2)
BLOOD UREA NITROGEN: 20 mg/dL (ref 7–21)
BUN / CREAT RATIO: 22
CALCIUM: 8.4 mg/dL — ABNORMAL LOW (ref 8.5–10.2)
CHLORIDE: 110 mmol/L — ABNORMAL HIGH (ref 98–107)
CO2: 20 mmol/L — ABNORMAL LOW (ref 22.0–30.0)
EGFR CKD-EPI AA FEMALE: 79 mL/min/{1.73_m2} (ref >=60)
EGFR CKD-EPI NON-AA FEMALE: 68 mL/min/{1.73_m2} (ref >=60)
GLUCOSE RANDOM: 210 mg/dL — ABNORMAL HIGH (ref 70–179)
POTASSIUM: 4 mmol/L (ref 3.5–5.0)
PROTEIN TOTAL: 4.9 g/dL — ABNORMAL LOW (ref 6.5–8.3)
SODIUM: 137 mmol/L (ref 135–145)

## 2019-03-03 LAB — LARGE UNSTAINED CELLS: Lab: 5 — ABNORMAL HIGH

## 2019-03-03 LAB — PROTIME: Lab: 21.7 — ABNORMAL HIGH

## 2019-03-03 LAB — ANISOCYTOSIS

## 2019-03-03 LAB — EGFR CKD-EPI NON-AA FEMALE: Lab: 68

## 2019-03-03 LAB — MAGNESIUM: Magnesium:MCnc:Pt:Ser/Plas:Qn:: 1.7

## 2019-03-03 NOTE — Unmapped (Signed)
Patient is alert and oriented. VSS. Her hemoglobin was low 5.7. She was very weak in the morning . Gave her 2 units of blood. Also gave her 25mg  albumin. Restarted on iv abx. Pt has 2 piv. IV team placed one of them. She is hard stick. Pt is calling for help with ADLs and ambulation. She remains with out falls. Be alarm is activated. Adequate urine out put. She had 3 Loose stools due to Lactulose. Also getting diuretics. No skin breakdown. Using SCDs for VTE. Will continue with plan of care.   Problem: Adult Inpatient Plan of Care  Goal: Plan of Care Review  Outcome: Progressing  Flowsheets (Taken 03/02/2019 1801)  Progress: improving  Plan of Care Reviewed With: patient  Goal: Patient-Specific Goal (Individualization)  Outcome: Progressing  Flowsheets (Taken 03/02/2019 1801)  Patient-Specific Goals (Include Timeframe): Pt will remain falls free through this shift  Individualized Care Needs: ACHS, VS/labs, falls prevention  Anxieties, Fears or Concerns: None voiced  Goal: Absence of Hospital-Acquired Illness or Injury  Outcome: Progressing  Intervention: Identify and Manage Fall Risk  Flowsheets (Taken 03/02/2019 1801)  Safety Interventions: fall reduction program maintained  Intervention: Prevent Skin Injury  Flowsheets (Taken 03/02/2019 1801)  Pressure Reduction Techniques: frequent weight shift encouraged  Intervention: Prevent VTE (venous thromboembolism)  Flowsheets (Taken 03/02/2019 1801)  VTE Prevention/Management:   anticoagulation therapy   ambulation promoted  Intervention: Prevent Infection  Flowsheets (Taken 03/02/2019 1801)  Infection Prevention: handwashing promoted  Goal: Optimal Comfort and Wellbeing  Outcome: Progressing  Intervention: Monitor Pain and Promote Comfort  Flowsheets (Taken 03/02/2019 1801)  Pain Management Interventions: care clustered  Intervention: Provide Person-Centered Care  Flowsheets (Taken 03/02/2019 1801)  Trust Relationship/Rapport:   care explained   questions answered   empathic listening provided  Goal: Readiness for Transition of Care  Outcome: Progressing  Intervention: Mutually Develop Transition Plan  Flowsheets (Taken 03/02/2019 1801)  Equipment Currently Used at Home: walker, rolling  Concerns to be Addressed: care coordination/care conferences  Readmission Within the Last 30 Days: no previous admission in last 30 days  Goal: Rounds/Family Conference  Outcome: Progressing  Flowsheets (Taken 03/02/2019 1801)  Participants:   case manager   nursing   physician   physical therapy   occupational therapy     Problem: Fall Injury Risk  Goal: Absence of Fall and Fall-Related Injury  Outcome: Progressing  Intervention: Identify and Manage Contributors to Fall Injury Risk  Flowsheets (Taken 03/02/2019 1801)  Medication Review/Management: medications reviewed  Self-Care Promotion:   independence encouraged   BADL personal objects within reach  Intervention: Promote Injury-Free Environment  Flowsheets (Taken 03/02/2019 1801)  Safety Interventions: fall reduction program maintained  Environmental Safety Modification: clutter free environment maintained     Problem: Anemia  Goal: Anemia Symptom Improvement  Outcome: Progressing  Intervention: Monitor and Manage Anemia  Flowsheets (Taken 03/02/2019 1801)  Fatigue Management: activity schedule adjusted     Problem: Hypertension Comorbidity  Goal: Blood Pressure in Desired Range  Outcome: Progressing  Intervention: Maintain Hypertension-Management Strategies  Flowsheets (Taken 03/02/2019 1801)  Medication Review/Management: medications reviewed     Problem: Self-Care Deficit  Goal: Improved Ability to Complete Activities of Daily Living  Outcome: Progressing  Intervention: Promote Activity and Functional Independence  Flowsheets (Taken 03/02/2019 1801)  Activity Assistance Provided: assistance, 1 person  Self-Care Promotion:   independence encouraged   BADL personal objects within reach     Problem: Adult Inpatient Plan of Care  Goal: Plan of Care Review Outcome: Progressing  Flowsheets (Taken 03/02/2019 1801)  Progress: improving  Plan of Care Reviewed With: patient  Goal: Patient-Specific Goal (Individualization)  Outcome: Progressing  Flowsheets (Taken 03/02/2019 1801)  Patient-Specific Goals (Include Timeframe): Pt will remain falls free through this shift  Individualized Care Needs: ACHS, VS/labs, falls prevention  Anxieties, Fears or Concerns: None voiced  Goal: Absence of Hospital-Acquired Illness or Injury  Outcome: Progressing  Intervention: Identify and Manage Fall Risk  Flowsheets (Taken 03/02/2019 1801)  Safety Interventions: fall reduction program maintained  Intervention: Prevent Skin Injury  Flowsheets (Taken 03/02/2019 1801)  Pressure Reduction Techniques: frequent weight shift encouraged  Intervention: Prevent VTE (venous thromboembolism)  Flowsheets (Taken 03/02/2019 1801)  VTE Prevention/Management:   anticoagulation therapy   ambulation promoted  Intervention: Prevent Infection  Flowsheets (Taken 03/02/2019 1801)  Infection Prevention: handwashing promoted  Goal: Optimal Comfort and Wellbeing  Outcome: Progressing  Intervention: Monitor Pain and Promote Comfort  Flowsheets (Taken 03/02/2019 1801)  Pain Management Interventions: care clustered  Intervention: Provide Person-Centered Care  Flowsheets (Taken 03/02/2019 1801)  Trust Relationship/Rapport:   care explained   questions answered   empathic listening provided  Goal: Readiness for Transition of Care  Outcome: Progressing  Intervention: Mutually Develop Transition Plan  Flowsheets (Taken 03/02/2019 1801)  Equipment Currently Used at Home: walker, rolling  Concerns to be Addressed: care coordination/care conferences  Readmission Within the Last 30 Days: no previous admission in last 30 days  Goal: Rounds/Family Conference  Outcome: Progressing  Flowsheets (Taken 03/02/2019 1801)  Participants:   case manager   nursing   physician   physical therapy   occupational therapy     Problem: Fall Injury Risk Goal: Absence of Fall and Fall-Related Injury  Outcome: Progressing  Intervention: Identify and Manage Contributors to Fall Injury Risk  Flowsheets (Taken 03/02/2019 1801)  Medication Review/Management: medications reviewed  Self-Care Promotion:   independence encouraged   BADL personal objects within reach  Intervention: Promote Injury-Free Environment  Flowsheets (Taken 03/02/2019 1801)  Safety Interventions: fall reduction program maintained  Environmental Safety Modification: clutter free environment maintained     Problem: Anemia  Goal: Anemia Symptom Improvement  Outcome: Progressing  Intervention: Monitor and Manage Anemia  Flowsheets (Taken 03/02/2019 1801)  Fatigue Management: activity schedule adjusted     Problem: Hypertension Comorbidity  Goal: Blood Pressure in Desired Range  Outcome: Progressing  Intervention: Maintain Hypertension-Management Strategies  Flowsheets (Taken 03/02/2019 1801)  Medication Review/Management: medications reviewed     Problem: Self-Care Deficit  Goal: Improved Ability to Complete Activities of Daily Living  Outcome: Progressing  Intervention: Promote Activity and Functional Independence  Flowsheets (Taken 03/02/2019 1801)  Activity Assistance Provided: assistance, 1 person  Self-Care Promotion:   independence encouraged   BADL personal objects within reach

## 2019-03-03 NOTE — Unmapped (Signed)
Adult Nutrition Assessment Note    Visit Type: RN Consult  Reason for Visit: Per Admission Nutrition Screen (Adult), Have you gained or lost 10 pounds in the past 3 months?, Have you had a decrease in food intake or appetite?    ASSESSMENT:   HPI & PMH:  63 y.o. female with a PMHx of NAFLD??cirrhosis c/b hepatic encephalopathy and esophageal varices s/p TIPS??with chronic blood loss anemia, HFpEF,??T2DM, who was recently discharged 6/9 from OSH after receiving 3u pRBCs d/t acute blood loss anemia.??Returned to OSH the next day??due to progressively altered mental status as reported by her husband and was transferred to [place]??6/12??for further evaluation and treatment.??  Nutrition Hx: patient off unit for procedure today, will follow up for nutrition history.   Nutritionally Pertinent Meds:  Lantus ; SSI ; 1000 IU D3 daily ; protonix ; aldactone ; lactulose ; PO KCl daily ; reviewed   Labs: reviewed    Abd/GI:  Soft/rounded/nontender BM 6/15 per RN documentation   Skin:      Active Wounds     None               Current nutrition therapy order:   Nutrition Orders          Nutrition Therapy Clear Liquid starting at 06/15 1453           Anthropometric Data:  -- Height: 157.5 cm (5' 2.01)   -- Last recorded weight: 93 kg (205 lb)  -- Admission weight: 93 kg (205 lb)  -- IBW: 49.99 kg  -- Percent IBW: 186.01 %  -- BMI: Body mass index is 37.49 kg/m??.   -- Weight changes this admission:   Last 5 Recorded Weights    02/28/19 0026   Weight: 93 kg (205 lb)      -- Weight history PTA:    Wt Readings from Last 10 Encounters:   02/28/19 93 kg (205 lb)   11/14/18 88.9 kg (196 lb)   11/14/18 88 kg (193 lb 14.4 oz)   08/08/18 83.3 kg (183 lb 11.2 oz)   08/08/18 83.3 kg (183 lb 11.2 oz)   07/09/18 96.5 kg (212 lb 11.9 oz)   07/02/18 96.9 kg (213 lb 11.2 oz)   06/18/18 89.3 kg (196 lb 12.8 oz)   03/28/18 84.9 kg (187 lb 3.2 oz)   01/28/18 92.3 kg (203 lb 6.4 oz)        Daily Estimated Nutrient Needs:    Energy: 1338 kcals [Per [place] [employer]-Jeor Equation using estimated dry weight, 86 kg (03/03/19 1543)]  Protein: 84 gm [25% of kcal using estimated dry weight, 86 kg (03/03/19 1543)]  Carbohydrate:   [45-60% of kcal]  Fluid:   [per MD team]     Nutrition Focused Physical Exam:              Fluid Accumulation/Edema  Upper Extremities: 1+ pitting edema  Lower Extremities: 1+ pitting edema    Nutrition Evaluation  Nutrition Designation: Obese class I  (BMI 30.00 - 34.99 kg/m2)(per estimated dry wt 86kg) (03/03/19 1542)     DIAGNOSIS:  Malnutrition Assessment using AND/ASPEN Clinical Characteristics:    Pending nutrition history and NFPE    Overall nutrition impression:    - Predicted suboptimal energy intake related to decreased appetite as evidenced by patinet reported decreased appetite on admit screen       GOALS:  Oral Intake:       - Patient to consume 60% of 4-6 small meals per day.  -  Patient to consume 60% of 1 oral supplements per day.     RECOMMENDATIONS AND INTERVENTIONS:  ?? Recommend 2 g Low Sodium Diet once medically feasible (Could liberalize to Regular if intakes are poor)  ?? Offer Ensure prn    RD Follow Up Parameters:  1-2 times per week (and more frequent as indicated)     Gladys Damme MS, RD, LD, CNSC  [phone]

## 2019-03-03 NOTE — Unmapped (Signed)
DAY OF SURGERY UPDATE    H&P reviewed. The patient was examined and there are no changes to the H&P.      SITE MARKING ATTESTATION    Site Marked: Not required      CONSENT FOR OPERATION OR PROCEDURE: PROVIDER CERTIFICATION    I hereby certify that the nature, purpose, benefits, usual and most frequent risks of, and alternatives to, the operation or procedure have been explained to the patient (or person authorized to sign for the patient) either by a physician or by the provider who is to perform the operation or procedure, that the patient has had an opportunity to ask questions, and that those questions have been answered. The patient or the patient's representative has been advised that selected tasks may be performed by assistants to the primary health care provider(s). I believe that the patient (or person authorized to sign for the patient) understands what has been explained, and has consented to the operation or procedure.

## 2019-03-03 NOTE — Unmapped (Signed)
Pt is AOX2-3, VSS on RA. She denied any pain. Pt was able to order her dinner without assistance. BS monitored and treated as per orders. Pt continues to need assistance with ADLs. Fall precautions in place. Bedside table and call bell within reach. Will continue with plan of care.  Problem: Adult Inpatient Plan of Care  Goal: Plan of Care Review  Flowsheets (Taken 03/03/2019 0106)  Plan of Care Reviewed With: patient  Goal: Patient-Specific Goal (Individualization)  Flowsheets (Taken 03/03/2019 0106)  Patient-Specific Goals (Include Timeframe): Pt will remain falls free through her admission  Individualized Care Needs: ACHS, bleeding/ falls precaution, VS/labs  Anxieties, Fears or Concerns: none voiced  Goal: Absence of Hospital-Acquired Illness or Injury  Intervention: Identify and Manage Fall Risk  Flowsheets (Taken 03/03/2019 0106)  Safety Interventions: fall reduction program maintained  Intervention: Prevent Skin Injury  Flowsheets (Taken 03/03/2019 0106)  Pressure Reduction Techniques: frequent weight shift encouraged  Intervention: Prevent VTE (venous thromboembolism)  Flowsheets (Taken 03/03/2019 0106)  VTE Prevention/Management:  ??? anticoagulation therapy  ??? bleeding precautions maintained  Intervention: Prevent Infection  Flowsheets (Taken 03/03/2019 0106)  Infection Prevention: handwashing promoted  Goal: Optimal Comfort and Wellbeing  Intervention: Monitor Pain and Promote Comfort  Flowsheets (Taken 03/03/2019 0106)  Pain Management Interventions: quiet environment facilitated  Intervention: Provide Person-Centered Care  Flowsheets (Taken 03/03/2019 0106)  Trust Relationship/Rapport:  ??? care explained  ??? choices provided  ??? questions answered  ??? questions encouraged     Problem: Fall Injury Risk  Goal: Absence of Fall and Fall-Related Injury  Intervention: Identify and Manage Contributors to Fall Injury Risk  Flowsheets (Taken 03/03/2019 0106)  Self-Care Promotion:  ??? independence encouraged  ??? BADL personal objects within reach  Intervention: Promote Injury-Free Environment  Flowsheets (Taken 03/03/2019 0106)  Safety Interventions: fall reduction program maintained  Environmental Safety Modification: clutter free environment maintained     Problem: Anemia  Goal: Anemia Symptom Improvement  Intervention: Monitor and Manage Anemia  Flowsheets (Taken 03/03/2019 0106)  Fatigue Management:  ??? activity schedule adjusted  ??? frequent rest breaks encouraged     Problem: Self-Care Deficit  Goal: Improved Ability to Complete Activities of Daily Living  Intervention: Promote Activity and Functional Independence  Flowsheets (Taken 03/03/2019 0106)  Activity Assistance Provided: assistance, 1 person  Self-Care Promotion:  ??? independence encouraged  ??? BADL personal objects within reach

## 2019-03-03 NOTE — Unmapped (Signed)
Internal Medicine (MDW) Progress Note    Interval History:    No acute events overnight. Patient is well appearing, and mental status continues to improve. She feels less weak/stable compared with yesterday. Received 2u PRBCs yesterday. Her hemoglobin this morning was 6.6, down from 7.4 yesterday after transfusion. We will transfuse her one unit and continue ceftriaxone. Went for EGD today, however patient had extensive food within stomach (?poor gastric emptying) and test has been postponed to 6/17.    Assessment/Plan:  Principal Problem:    Hepatic encephalopathy (CMS-HCC)  Active Problems:    NAFLD (nonalcoholic fatty liver disease)    Cirrhosis (CMS-HCC)    GAVE (gastric antral vascular ectasia)    Hypothyroid    Chronic blood loss anemia  Resolved Problems:    * No resolved hospital problems. *      Marilyn French is a 63 y.o. female with a PMHx of NAFLD??cirrhosis c/b hepatic encephalopathy and esophageal varices s/p TIPS??with chronic blood loss anemia, HFpEF,??T2DM, who was recently discharged 6/9 from OSH after receiving 3u pRBCs d/t acute blood loss anemia.??Returned to OSH the next day??due to progressively altered mental status as reported by her husband and was transferred to [facility] 6/12 for further evaluation and treatment.??  ??  Nonalcoholic cirrhosis??c/b hepatic encephalopathy: Patient with AMS in the context of acute-on-chronic blood loss anemia w/ known esophageal varices/GAVE.??She has been receiving transfusions almost weekly.??Altered Mental Status likely triggered by ongoing UGIB, hyperammonemia especially given patient is s/p TIPS 06/2018. Major variceal bleed unlikely given hemodynamic stability??and appropriate response to transfusion at OSH.??Chest xray with no acute airspace opacities, stable cardiomegaly. Infectious workup negative to this point and no fluid on u/s for diagnostic paracentesis.??Patient is currently being worked up at [facility] for liver transplant.  - continue spironolactone, torsemide,??lactulose,??rifaximin-titrate lactulose to 3-5 BM daily  - s/p albumin 25g TID x48 hours  - EGD as below    Acute on chronic iron deficiency anemia secondary to chronic blood loss??secondary to known portal hypertension and esophageal varices??s/p multiple bands, GAVE??requiring APC??with limited benefit.??On 6/8 her hemoglobin was routinely checked and noted to be 4.8 from a??recent??prior of 6.4 with a baseline around 7. She was admitted??to OSH??given 3u PRBCs, follow up Hgb 7.9, discharged the following day (6/9).??At OSH 6/11, Hgb 6.6. Given 1u, f/u Hgb 8.0 prior to transfer. Low concern for acute bleed given appropriate response to transfusion, known chronic blood loss anemia requiring weekly transfusions,??and hemodynamic stability.   Hemoglobin was 6.6 this morning down from 7.4.   - transfuse 1 unit today  - ceftriaxone 1 g qdaily(prophylactically in setting of UGIB)  - q12h CBC  - active type and screen, transfuse hgb if less than 7  - EGD 6/17  - GI following     HFpEF TTE 11/27/16 EF 60-65% with normal systolic function, LVH, mild MR and mild LA dilation.??VSS,??Patient appears euvolemic??on exam, chest xray with stable cardiomegaly   - continue home diuretics  - daily weights and monitor I&O    Chronic Medical Conditions:  Hypothyroidism:?? continue home levothyroxine 25 mcg  T2DM:??on lantus 40u nightly + SSI at home.??Lantus 30 units and sliding scale right now. Blood sugars relatively uncontrolled at 210s this AM and 309 last night, but she is NPO; will consider increasing towards home dosing after EGD.  Thrombocytopenia: stable at baseline     Daily Checklist:  Diet: regular  DVT PPx: scds  GI PPx: ppi  Electrolytes: Replete PRN  Code Status: Full Code    Dispo: floor, EGD 6/17,  home 6/17-6/18?  ___________________________________________________________________    Labs/Studies:  Labs and studies from the last 24 hours reviewed.    Objective:  Temp:  [35.3 ??C (95.5 ??F)-36 ??C (96.8 ??F)] 36 ??C (96.8 ??F) Heart Rate:  [78-84] 79  Resp:  [18-24] 20  BP: (113-149)/(51-72) 142/58  SpO2:  [95 %-97 %] 96 %    GEN: NAD, alert, oriented, answers questions appropriately, mental status continues to improve  CV: RRR, S1, S2, no M/R/G  RESP: normal WOB on RA  ABD: Normoactive bowel sounds, soft, NTND, no rebound/guarding  EXT: No clubbing, cyanosis, or edema  SKIN:  No rashes or lesions noted    Darrel Hoover, MS4    I attest that I have reviewed the student note and that the components of the history of the present illness, the physical exam, and the assessment and plan documented were performed by me or were performed in my presence by the student where I verified the documentation and performed (or re-performed) the exam and medical decision making.      Donah Driver, MD  PGY-1 - Internal Medicine  Contact [phone]

## 2019-03-04 LAB — GLUCOSE RANDOM: Glucose:MCnc:Pt:Ser/Plas:Qn:: 154

## 2019-03-04 LAB — COMPREHENSIVE METABOLIC PANEL
ALBUMIN: 2.8 g/dL — ABNORMAL LOW (ref 3.5–5.0)
ALKALINE PHOSPHATASE: 53 U/L (ref 38–126)
ALT (SGPT): 19 U/L (ref <35)
ANION GAP: 10 mmol/L (ref 7–15)
AST (SGOT): 47 U/L — ABNORMAL HIGH (ref 14–38)
BILIRUBIN TOTAL: 2.8 mg/dL — ABNORMAL HIGH (ref 0.0–1.2)
BLOOD UREA NITROGEN: 23 mg/dL — ABNORMAL HIGH (ref 7–21)
BUN / CREAT RATIO: 30
CALCIUM: 8.3 mg/dL — ABNORMAL LOW (ref 8.5–10.2)
CHLORIDE: 109 mmol/L — ABNORMAL HIGH (ref 98–107)
CO2: 20 mmol/L — ABNORMAL LOW (ref 22.0–30.0)
CREATININE: 0.77 mg/dL (ref 0.60–1.00)
EGFR CKD-EPI AA FEMALE: >90 mL/min/{1.73_m2} (ref >=60)
EGFR CKD-EPI NON-AA FEMALE: 82 mL/min/{1.73_m2} (ref >=60)
POTASSIUM: 3.9 mmol/L (ref 3.5–5.0)
PROTEIN TOTAL: 5.3 g/dL — ABNORMAL LOW (ref 6.5–8.3)
SODIUM: 139 mmol/L (ref 135–145)

## 2019-03-04 LAB — CBC
HEMATOCRIT: 29.6 % — ABNORMAL LOW (ref 36.0–46.0)
HEMOGLOBIN: 9.4 g/dL — ABNORMAL LOW (ref 12.0–16.0)
MEAN CORPUSCULAR HEMOGLOBIN CONC: 31.9 g/dL (ref 31.0–37.0)
MEAN CORPUSCULAR HEMOGLOBIN: 29.3 pg (ref 26.0–34.0)
MEAN CORPUSCULAR VOLUME: 92 fL (ref 80.0–100.0)
MEAN PLATELET VOLUME: 14.9 fL — ABNORMAL HIGH (ref 7.0–10.0)
PLATELET COUNT: 37 10*9/L — ABNORMAL LOW (ref 150–440)
RED BLOOD CELL COUNT: 3.22 10*12/L — ABNORMAL LOW (ref 4.00–5.20)
WBC ADJUSTED: 2.9 10*9/L — ABNORMAL LOW (ref 4.5–11.0)

## 2019-03-04 LAB — INR: Lab: 1.8

## 2019-03-04 LAB — MEAN CORPUSCULAR VOLUME: Lab: 92

## 2019-03-04 LAB — MAGNESIUM: Magnesium:MCnc:Pt:Ser/Plas:Qn:: 1.9

## 2019-03-04 LAB — CULTURE, BLOOD (ROUTINE X 2)
Culture: NO GROWTH
Culture: NO GROWTH
Special Requests: ADEQUATE

## 2019-03-04 NOTE — Unmapped (Signed)
Patient alert and oriented x 3 this shift.  Patient denies any pain or distress.  Patient NPO for EDG this shift.  Patient edematous this shift.  Patient received 1 unit of blood and another in GI.  Patient returned to unit.  No needs at this time.  Will continue to monitor.    Problem: Adult Inpatient Plan of Care  Goal: Plan of Care Review  Outcome: Ongoing - Unchanged  Goal: Patient-Specific Goal (Individualization)  Outcome: Ongoing - Unchanged  Flowsheets (Taken 03/03/2019 1728)  Patient-Specific Goals (Include Timeframe): Patient will remain free from falls by discharge.  Individualized Care Needs: ACHS, bleeding /falls VS,Labs  Anxieties, Fears or Concerns: none voiced  Goal: Absence of Hospital-Acquired Illness or Injury  Outcome: Ongoing - Unchanged  Goal: Optimal Comfort and Wellbeing  Outcome: Ongoing - Unchanged  Goal: Readiness for Transition of Care  Outcome: Ongoing - Unchanged  Goal: Rounds/Family Conference  Outcome: Ongoing - Unchanged

## 2019-03-04 NOTE — Unmapped (Signed)
Pt admitted with hepatic encephalopathy and anemia.  Pt alert and oriented x4.  Pt had one fall this shift.  Bed alarm engaged.  Pt instructed to call for assist out of bed.  Monitoring patient's labs for anemia.  Pt with history or htn.  Self care encouraged.  Discharge planing progressing.  Will continue to monitor.     Problem: Adult Inpatient Plan of Care  Goal: Plan of Care Review  Outcome: Progressing  Goal: Patient-Specific Goal (Individualization)  Outcome: Progressing  Flowsheets (Taken 03/04/2019 1523)  Patient-Specific Goals (Include Timeframe): PT will remain free of falls  Individualized Care Needs: Monitor glucose, labs and vital signs  Anxieties, Fears or Concerns: Pt denies concerns  Goal: Absence of Hospital-Acquired Illness or Injury  Outcome: Progressing  Goal: Optimal Comfort and Wellbeing  Outcome: Progressing  Goal: Readiness for Transition of Care  Outcome: Progressing  Goal: Rounds/Family Conference  Outcome: Progressing     Problem: Fall Injury Risk  Goal: Absence of Fall and Fall-Related Injury  Outcome: Progressing     Problem: Anemia  Goal: Anemia Symptom Improvement  Outcome: Progressing     Problem: Hypertension Comorbidity  Goal: Blood Pressure in Desired Range  Outcome: Progressing     Problem: Hypertension Comorbidity  Goal: Blood Pressure in Desired Range  Outcome: Progressing     Problem: Self-Care Deficit  Goal: Improved Ability to Complete Activities of Daily Living  Outcome: Progressing

## 2019-03-04 NOTE — Unmapped (Signed)
Pt alert and oriented x4. VSS with no acute episode noted during shift. Pt tolerated medications without complication and denies any pain or SOB. Pt does not show any signs of bleeding. She is NPO for EGD. Will continue to monitor.  Problem: Adult Inpatient Plan of Care  Goal: Plan of Care Review  Outcome: Progressing  Goal: Patient-Specific Goal (Individualization)  Outcome: Progressing  Flowsheets (Taken 03/04/2019 0345)  Patient-Specific Goals (Include Timeframe): Pt will remain free of falls before discharge  Individualized Care Needs: monitor glucose, labs, vitals  Anxieties, Fears or Concerns: Pt concerned about procedure  Goal: Absence of Hospital-Acquired Illness or Injury  Outcome: Progressing  Goal: Optimal Comfort and Wellbeing  Outcome: Progressing  Goal: Readiness for Transition of Care  Outcome: Progressing  Goal: Rounds/Family Conference  Outcome: Progressing     Problem: Fall Injury Risk  Goal: Absence of Fall and Fall-Related Injury  Outcome: Progressing  Intervention: Identify and Manage Contributors to Fall Injury Risk  Flowsheets (Taken 03/04/2019 0345)  Medication Review/Management: medications reviewed  Self-Care Promotion: BADL personal objects within reach     Problem: Anemia  Goal: Anemia Symptom Improvement  Outcome: Progressing  Intervention: Monitor and Manage Anemia  Flowsheets (Taken 03/04/2019 0345)  Fatigue Management: activity assistance provided     Problem: Hypertension Comorbidity  Goal: Blood Pressure in Desired Range  Outcome: Progressing  Intervention: Maintain Hypertension-Management Strategies  Flowsheets (Taken 03/04/2019 0345)  Medication Review/Management: medications reviewed     Problem: Self-Care Deficit  Goal: Improved Ability to Complete Activities of Daily Living  Outcome: Progressing  Intervention: Promote Activity and Functional Independence  Flowsheets (Taken 03/04/2019 0345)  Self-Care Promotion: BADL personal objects within reach

## 2019-03-04 NOTE — Unmapped (Signed)
EGD today:  Findings:       Food (residue) was found in the duodenal bulb.       A large amount of food (residue) was found in the entire examined        stomach. Only about 20% of the gastric mucosa was visualized. No obvious        bleeding source identified though there was some red color mixed in with        the food suggesting low volume upper GI bleeding.       The esophagus was normal.                                                                                   Impression:        - Retained food in the duodenum.                     - A large amount of red-tinged food (residue) in the                      stomach. Limited visualization of underlying mucosa. No                      active bleeding source identified on this limited exam.                     - Normal esophagus.                     - No specimens collected.    Plan:  - Clear liquid diet  - Continue lactulose  - Continue rifaximin 550 mg BID  - Likely EGD 6/17    Tawni Carnes, MD, MPH  Gastroenterology Fellow, PGY-6  Pager [phone]

## 2019-03-04 NOTE — Unmapped (Signed)
Internal Medicine (MDW) Progress Note    Interval History:    No acute events overnight. Patient is well appearing, and mental status continues to improve. Did have a fall today but feels fine afterwards; seems that she slipped and maybe was caught up in her IV while she was in a hurry to get to the bathroom. Received 1u PRBCs yesterday. Her yesterday evening was 9.2 from 6.6. We will continue ceftriaxone. Giving Lasix 40mg  IV today given she is a bit more volume overloaded on exam. CLD today, NPO at [facility] for EGD tomorrow.    Assessment/Plan:  Principal Problem:    Hepatic encephalopathy (CMS-HCC)  Active Problems:    NAFLD (nonalcoholic fatty liver disease)    Cirrhosis (CMS-HCC)    GAVE (gastric antral vascular ectasia)    Hypothyroid    Chronic blood loss anemia  Resolved Problems:    * No resolved hospital problems. *      Marilyn French is a 63 y.o. female with a PMHx of NAFLD??cirrhosis c/b hepatic encephalopathy and esophageal varices s/p TIPS??with chronic blood loss anemia, HFpEF,??T2DM, who was recently discharged 6/9 from OSH after receiving 3u pRBCs d/t acute blood loss anemia.??Returned to OSH the next day??due to progressively altered mental status as reported by her husband and was transferred to [facility] 6/12 for further evaluation and treatment.??  ??  Nonalcoholic cirrhosis??c/b hepatic encephalopathy: Patient with AMS in the context of acute-on-chronic blood loss anemia w/ known esophageal varices/GAVE.??She has been receiving transfusions almost weekly.??Altered Mental Status likely triggered by ongoing UGIB, hyperammonemia especially given patient is s/p TIPS 06/2018. Major variceal bleed unlikely given hemodynamic stability??and appropriate response to transfusion at OSH.??Chest xray with no acute airspace opacities, stable cardiomegaly. Infectious workup negative to this point and no fluid on u/s for diagnostic paracentesis.??Patient is currently being worked up at [facility] for liver transplant.  - continue spironolactone, torsemide,??lactulose,??rifaximin-titrate lactulose to 3-5 BM daily  - giving Lasix 40mg  IV today given a bit more volume overloaded on exam  - s/p albumin 25g TID x48 hours  - EGD as below    Acute on chronic iron deficiency anemia secondary to chronic blood loss??secondary to known portal hypertension and esophageal varices??s/p multiple bands, GAVE??requiring APC??with limited benefit.??On 6/8 her hemoglobin was routinely checked and noted to be 4.8 from a??recent??prior of 6.4 with a baseline around 7. She was admitted??to OSH??given 3u PRBCs, follow up Hgb 7.9, discharged the following day (6/9).??At OSH 6/11, Hgb 6.6. Given 1u, f/u Hgb 8.0 prior to transfer. Low concern for acute bleed given appropriate response to transfusion, known chronic blood loss anemia requiring weekly transfusions,??and hemodynamic stability. S/p 3 units PRBCs since admission with appropriate response. Hemoglobin 9.2 last night from 6.6.   - ceftriaxone 1 g qdaily(prophylactically in setting of UGIB)  - q12h CBC  - active type and screen, transfuse hgb if less than 7  - EGD 6/17  - GI following     HFpEF TTE 11/27/16 EF 60-65% with normal systolic function, LVH, mild MR and mild LA dilation.??VSS,??Patient appears euvolemic??on exam, chest xray with stable cardiomegaly   - continue home diuretics  - giving Lasix 40mg  IV today given a bit more volume overloaded on exam  - daily weights and monitor I&O    Chronic Medical Conditions:  Hypothyroidism:?? continue home levothyroxine 25 mcg  T2DM:??on lantus 40u nightly + SSI at home.??Lantus 30 units and sliding scale right now. Blood sugars relatively uncontrolled at 210s this AM, but she is NPO; will consider increasing towards  home dosing after EGD.  Thrombocytopenia: stable at baseline     Daily Checklist:  Diet: regular  DVT PPx: scds  GI PPx: ppi  Electrolytes: Replete PRN  Code Status: Full Code    Dispo: floor, EGD 6/17, home 6/17-6/18? ___________________________________________________________________    Labs/Studies:  Labs and studies from the last 24 hours reviewed.    Objective:  Temp:  [35.3 ??C (95.5 ??F)-36.8 ??C (98.3 ??F)] 35.3 ??C (95.5 ??F)  Heart Rate:  [71-81] 71  Resp:  [16-22] 18  BP: (136-159)/(46-65) 148/62  SpO2:  [90 %-98 %] 97 %    GEN: NAD, alert, oriented, answers questions appropriately, mental status continues to improve  CV: RRR, S1, S2, no M/R/G  RESP: slight increased work of breathing on AM exam with audible wheezing that improved on reassessment  ABD: Normoactive bowel sounds, soft, NTND, no rebound/guarding  EXT:1+ LE edema bilaterally, No clubbing or cyanosis  SKIN:  No rashes or lesions noted    Darrel Hoover, MS4    I attest that I have reviewed the student note and that the components of the history of the present illness, the physical exam, and the assessment and plan documented were performed by me or were performed in my presence by the student where I verified the documentation and performed (or re-performed) the exam and medical decision making.      Donah Driver, MD  PGY-1 - Internal Medicine  Contact [phone]

## 2019-03-05 DIAGNOSIS — K7290 Hepatic failure, unspecified without coma: Principal | ICD-10-CM

## 2019-03-05 LAB — CBC
HEMATOCRIT: 26.3 % — ABNORMAL LOW (ref 36.0–46.0)
MEAN CORPUSCULAR HEMOGLOBIN CONC: 33.5 g/dL (ref 31.0–37.0)
MEAN CORPUSCULAR HEMOGLOBIN: 29.9 pg (ref 26.0–34.0)
MEAN CORPUSCULAR VOLUME: 89.3 fL (ref 80.0–100.0)
MEAN PLATELET VOLUME: 13.1 fL — ABNORMAL HIGH (ref 7.0–10.0)
PLATELET COUNT: 48 10*9/L — ABNORMAL LOW (ref 150–440)
RED CELL DISTRIBUTION WIDTH: 21.2 % — ABNORMAL HIGH (ref 12.0–15.0)
WBC ADJUSTED: 2.7 10*9/L — ABNORMAL LOW (ref 4.5–11.0)

## 2019-03-05 LAB — COMPREHENSIVE METABOLIC PANEL
ALBUMIN: 2.5 g/dL — ABNORMAL LOW (ref 3.5–5.0)
ALKALINE PHOSPHATASE: 58 U/L (ref 38–126)
ALT (SGPT): 21 U/L (ref <35)
AST (SGOT): 51 U/L — ABNORMAL HIGH (ref 14–38)
BILIRUBIN TOTAL: 2.3 mg/dL — ABNORMAL HIGH (ref 0.0–1.2)
BLOOD UREA NITROGEN: 19 mg/dL (ref 7–21)
BUN / CREAT RATIO: 25
CALCIUM: 8.3 mg/dL — ABNORMAL LOW (ref 8.5–10.2)
CHLORIDE: 109 mmol/L — ABNORMAL HIGH (ref 98–107)
CO2: 25 mmol/L (ref 22.0–30.0)
CREATININE: 0.77 mg/dL (ref 0.60–1.00)
EGFR CKD-EPI AA FEMALE: >90 mL/min/{1.73_m2} (ref >=60)
GLUCOSE RANDOM: 97 mg/dL (ref 70–179)
POTASSIUM: 3.6 mmol/L (ref 3.5–5.0)
PROTEIN TOTAL: 4.8 g/dL — ABNORMAL LOW (ref 6.5–8.3)
SODIUM: 138 mmol/L (ref 135–145)

## 2019-03-05 LAB — INR: Lab: 1.7

## 2019-03-05 LAB — ALKALINE PHOSPHATASE: Alkaline phosphatase:CCnc:Pt:Ser/Plas:Qn:: 58

## 2019-03-05 LAB — MEAN CORPUSCULAR VOLUME: Lab: 89.3

## 2019-03-05 LAB — MAGNESIUM: Magnesium:MCnc:Pt:Ser/Plas:Qn:: 1.6

## 2019-03-05 NOTE — Unmapped (Signed)
Internal Medicine (MDW) Progress Note    Interval History:    No acute events overnight. Patient is well appearing, and mental status at baseline. No new complaints today. Received IV Lasix 40mg  yesterday. She did have 8 bowel movements yesterday with only one dose of Lactulose, will monitor stool output. EGD today showed severe GAVE with contact bleeding which was treated with Argon Plasma Coagulation, dispo pending hepatology recs after.     Assessment/Plan:  Principal Problem:    Hepatic encephalopathy (CMS-HCC)  Active Problems:    NAFLD (nonalcoholic fatty liver disease)    Cirrhosis (CMS-HCC)    GAVE (gastric antral vascular ectasia)    Hypothyroid    Chronic blood loss anemia  Resolved Problems:    * No resolved hospital problems. *      Marilyn French is a 63 y.o. female with a PMHx of NAFLD??cirrhosis c/b hepatic encephalopathy and esophageal varices s/p TIPS??with chronic blood loss anemia, HFpEF,??T2DM, who was recently discharged 6/9 from OSH after receiving 3u pRBCs d/t acute blood loss anemia.??Returned to OSH the next day??due to progressively altered mental status as reported by her husband and was transferred to [facility] 6/12 for further evaluation and treatment.??  ??  Nonalcoholic cirrhosis??c/b hepatic encephalopathy: Patient with AMS in the context of acute-on-chronic blood loss anemia w/ known esophageal varices/GAVE.??She has been receiving transfusions almost weekly.??Altered Mental Status likely triggered by ongoing UGIB, hyperammonemia especially given patient is s/p TIPS 06/2018. Patient is currently being worked up at [facility] for liver transplant.  - continue spironolactone, torsemide,??lactulose,??rifaximin-titrate lactulose to 3-5 BM daily  - s/p albumin 25g TID x48 hours  - EGD as below    Acute on chronic iron deficiency anemia secondary to chronic blood loss??secondary to known portal hypertension and esophageal varices??s/p multiple bands, GAVE??requiring APC??with limited benefit.??Hemoglobin 8.8 this am from 9.4 last night. Underwent EGD on 6/17 which demonstrated bleeding 2/2 GAVE and repeat APC.  - daily CBC  - transition IV protonix to oral  - active type and screen, transfuse hgb if less than 7  - GI following, appreciate recommendations    HFpEF TTE 11/27/16 EF 60-65% with normal systolic function, LVH, mild MR and mild LA dilation.??VSS,??Patient appears euvolemic??on exam, chest xray with stable cardiomegaly   - continue home diuretics  - s/p Lasix 40mg  IV yesterday  - daily weights and monitor I&O    Chronic Medical Conditions:  Hypothyroidism:?? continue home levothyroxine 25 mcg  T2DM:??on lantus 40u nightly + SSI at home.??Lantus 30 units and sliding scale right now. Blood sugars 218 last night and 105 this am; she is NPO, will consider increasing towards home dosing after EGD.  Thrombocytopenia: stable at baseline     Daily Checklist:  Diet: regular  DVT PPx: scds  GI PPx: ppi  Electrolytes: Replete PRN  Code Status: Full Code    Dispo: floor, EGD 6/17, home 6/17-6/18?  ___________________________________________________________________    Labs/Studies:  Labs and studies from the last 24 hours reviewed.    Objective:  Temp:  [35.4 ??C (95.7 ??F)-36.9 ??C (98.4 ??F)] 36.9 ??C (98.4 ??F)  Heart Rate:  [71-93] 79  Resp:  [14-20] 14  BP: (114-149)/(48-69) 139/58  SpO2:  [92 %-99 %] 92 %    GEN: NAD, alert, oriented, answers questions appropriately, mental status continues to improve  CV: RRR, S1, S2, no M/R/G  RESP: normal WOB on RA, improved from yesterday  ABD: Normoactive bowel sounds, soft, NTND, no rebound/guarding  EXT: trace LE edema bilaterally, No clubbing or cyanosis  SKIN:  No rashes or lesions noted    Darrel Hoover, MS4    I attest that I have reviewed the student note and that the components of the history of the present illness, the physical exam, and the assessment and plan documented were performed by me or were performed in my presence by the student where I verified the documentation and performed (or re-performed) the exam and medical decision making.      Donah Driver, MD  PGY-1 - Internal Medicine  Contact [phone]

## 2019-03-05 NOTE — Unmapped (Signed)
Pt alert and oriented x4. VSS with no signs of distress noted during the shift. Pt does not express any distress during the shift. She denies pain related to her fall and does not show any sign of bleeding. Pt has been able to tolerate medications without complication. She remains NPO for EGD. Will continue to monitor.  Problem: Adult Inpatient Plan of Care  Goal: Plan of Care Review  Outcome: Progressing  Goal: Patient-Specific Goal (Individualization)  Outcome: Progressing  Flowsheets (Taken 03/05/2019 0338)  Patient-Specific Goals (Include Timeframe): Pt will remain free of falls during hospitalization  Individualized Care Needs: Monitor glucose, labs, vital signs  Anxieties, Fears or Concerns: Pt does not voice any concerns at this time  Goal: Absence of Hospital-Acquired Illness or Injury  Outcome: Progressing  Goal: Optimal Comfort and Wellbeing  Outcome: Progressing  Goal: Readiness for Transition of Care  Outcome: Progressing  Goal: Rounds/Family Conference  Outcome: Progressing     Problem: Fall Injury Risk  Goal: Absence of Fall and Fall-Related Injury  Outcome: Progressing  Intervention: Identify and Manage Contributors to Fall Injury Risk  Flowsheets (Taken 03/05/2019 0338)  Medication Review/Management: medications reviewed  Self-Care Promotion: BADL personal objects within reach     Problem: Anemia  Goal: Anemia Symptom Improvement  Outcome: Progressing  Intervention: Monitor and Manage Anemia  Flowsheets (Taken 03/05/2019 0338)  Fatigue Management: activity assistance provided     Problem: Hypertension Comorbidity  Goal: Blood Pressure in Desired Range  Outcome: Progressing  Intervention: Maintain Hypertension-Management Strategies  Flowsheets (Taken 03/05/2019 0338)  Medication Review/Management: medications reviewed     Problem: Self-Care Deficit  Goal: Improved Ability to Complete Activities of Daily Living  Outcome: Progressing

## 2019-03-05 NOTE — Unmapped (Signed)
Marilyn French 6/5 scan reviewed at Multidisciplinary Hepatobillary Conference on March 05, 2019 resulting in the following plan of care: Patient to undergo CT abd while INPT @ [facility], and the reviewed by transplant surgery to determine candidacy.

## 2019-03-06 DIAGNOSIS — K7290 Hepatic failure, unspecified without coma: Principal | ICD-10-CM

## 2019-03-06 LAB — PROTIME: Lab: 19.3 — ABNORMAL HIGH

## 2019-03-06 LAB — COMPREHENSIVE METABOLIC PANEL
ALBUMIN: 2.7 g/dL — ABNORMAL LOW (ref 3.5–5.0)
ALKALINE PHOSPHATASE: 60 U/L (ref 38–126)
ALT (SGPT): 27 U/L (ref <35)
ANION GAP: 10 mmol/L (ref 7–15)
BILIRUBIN TOTAL: 2.2 mg/dL — ABNORMAL HIGH (ref 0.0–1.2)
BLOOD UREA NITROGEN: 20 mg/dL (ref 7–21)
CHLORIDE: 103 mmol/L (ref 98–107)
CO2: 24 mmol/L (ref 22.0–30.0)
CREATININE: 0.84 mg/dL (ref 0.60–1.00)
EGFR CKD-EPI AA FEMALE: 86 mL/min/{1.73_m2} (ref >=60)
EGFR CKD-EPI NON-AA FEMALE: 74 mL/min/{1.73_m2} (ref >=60)
GLUCOSE RANDOM: 264 mg/dL — ABNORMAL HIGH (ref 70–179)
POTASSIUM: 4.2 mmol/L (ref 3.5–5.0)
PROTEIN TOTAL: 5.1 g/dL — ABNORMAL LOW (ref 6.5–8.3)
SODIUM: 137 mmol/L (ref 135–145)

## 2019-03-06 LAB — CBC
HEMATOCRIT: 25.9 % — ABNORMAL LOW (ref 36.0–46.0)
HEMOGLOBIN: 8.5 g/dL — ABNORMAL LOW (ref 12.0–16.0)
MEAN CORPUSCULAR HEMOGLOBIN CONC: 32.6 g/dL (ref 31.0–37.0)
MEAN CORPUSCULAR HEMOGLOBIN: 29.3 pg (ref 26.0–34.0)
MEAN CORPUSCULAR VOLUME: 90 fL (ref 80.0–100.0)
PLATELET COUNT: 38 10*9/L — ABNORMAL LOW (ref 150–440)
RED BLOOD CELL COUNT: 2.88 10*12/L — ABNORMAL LOW (ref 4.00–5.20)
RED CELL DISTRIBUTION WIDTH: 21.4 % — ABNORMAL HIGH (ref 12.0–15.0)

## 2019-03-06 LAB — CHLORIDE: Chloride:SCnc:Pt:Ser/Plas:Qn:: 103

## 2019-03-06 LAB — HEMOGLOBIN: Hemoglobin:MCnc:Pt:Bld:Qn:: 8.5 — ABNORMAL LOW

## 2019-03-06 LAB — MAGNESIUM: Magnesium:MCnc:Pt:Ser/Plas:Qn:: 1.8

## 2019-03-06 MED ORDER — LANTUS SOLOSTAR U-100 INSULIN 100 UNIT/ML (3 ML) SUBCUTANEOUS PEN
Freq: Every day | SUBCUTANEOUS | 0 refills | 0.00000 days | Status: CP
Start: 2019-03-06 — End: 2019-04-05

## 2019-03-06 NOTE — Unmapped (Signed)
Pt admitted with hepatic encephalopathy.  Pt alert and oriented x 4.  Pt free of falls.  Pt denied complaint of pain.  Pt with a history of htn.  Blood pressure being monitored.  Self care encouraged.  Pt went for endoscopy today.  See results in epic.  Discharge planning progressing.  Will continue to monitor.    Problem: Adult Inpatient Plan of Care  Goal: Plan of Care Review  Outcome: Progressing  Goal: Patient-Specific Goal (Individualization)  Outcome: Progressing  Flowsheets (Taken 03/05/2019 1851)  Patient-Specific Goals (Include Timeframe): Pt will remain free of falls during this hospitalization  Individualized Care Needs: Monitor glucose  Anxieties, Fears or Concerns: Pt concerned about results of endonscopy  Goal: Absence of Hospital-Acquired Illness or Injury  Outcome: Progressing  Goal: Optimal Comfort and Wellbeing  Outcome: Progressing  Goal: Readiness for Transition of Care  Outcome: Progressing  Goal: Rounds/Family Conference  Outcome: Progressing     Problem: Anemia  Goal: Anemia Symptom Improvement  Outcome: Progressing     Problem: Hypertension Comorbidity  Goal: Blood Pressure in Desired Range  Outcome: Progressing     Problem: Self-Care Deficit  Goal: Improved Ability to Complete Activities of Daily Living  Outcome: Progressing     Problem: Fall Injury Risk  Goal: Absence of Fall and Fall-Related Injury  Outcome: Progressing

## 2019-03-06 NOTE — Unmapped (Signed)
Pt A&O x 4. VSS. Falls precautions maintained. Stand by assist with ADLs. No complaints of pain this shift. Contrast given for CT scan of pelvis and abd this shift. Pt had blood sugar of 441 at beginning of shift, MD notified (see orders). No needs at this time. Hourly rounding done.      Problem: Adult Inpatient Plan of Care  Goal: Plan of Care Review  Outcome: Progressing  Flowsheets (Taken 03/06/2019 0133)  Progress: improving  Plan of Care Reviewed With: patient  Goal: Patient-Specific Goal (Individualization)  Outcome: Progressing  Flowsheets (Taken 03/06/2019 0133)  Patient-Specific Goals (Include Timeframe): pt will remain free from falls from 7p-7a  Individualized Care Needs: ACHS, pain control, watch BMs, CT scan  Anxieties, Fears or Concerns: none voiced  Goal: Absence of Hospital-Acquired Illness or Injury  Outcome: Progressing  Goal: Optimal Comfort and Wellbeing  Outcome: Progressing  Goal: Readiness for Transition of Care  Outcome: Progressing  Goal: Rounds/Family Conference  Outcome: Progressing     Problem: Fall Injury Risk  Goal: Absence of Fall and Fall-Related Injury  Outcome: Progressing  Intervention: Identify and Manage Contributors to Fall Injury Risk  Flowsheets (Taken 03/06/2019 0133)  Medication Review/Management:   medications reviewed   high risk medications identified  Self-Care Promotion: BADL personal objects within reach     Problem: Anemia  Goal: Anemia Symptom Improvement  Outcome: Progressing     Problem: Hypertension Comorbidity  Goal: Blood Pressure in Desired Range  Outcome: Progressing  Intervention: Maintain Hypertension-Management Strategies  Flowsheets (Taken 03/06/2019 0133)  Syncope Management: position changed slowly  Medication Review/Management:   medications reviewed   high risk medications identified     Problem: Self-Care Deficit  Goal: Improved Ability to Complete Activities of Daily Living  Outcome: Progressing  Intervention: Promote Activity and Functional Independence  Flowsheets (Taken 03/06/2019 0133)  Activity Assistance Provided: assistance, 1 person  Self-Care Promotion: BADL personal objects within reach

## 2019-03-06 NOTE — Unmapped (Signed)
EGD today:  Impression:        - Normal esophagus.                     - Portal hypertensive gastropathy.                     - Gastric antral vascular ectasia with contact bleeding.                      This is the source of the patient's GI bleeding. Treated                      with argon plasma coagulation (APC).                     - One duodenal polyp. Resection not attempted.                     - Nodular mucosa in the first portion of the duodenum.                     - The examination was otherwise normal.                     - No specimens collected.    Recommendations:  - Advance diet  - Will arrange f/u EGD in 1 month for retreatment of GAVE  - Obtain CT A/P while inpatient for transplant w/u  - Continue lactulose (titrated to 3 BMs/day) and rifaximin BID  - F/u with Dr. Ruffin Frederick in clinic    Tawni Carnes, MD, MPH  Gastroenterology Fellow, PGY-6  Pager [phone]    Patient discussed with Dr. Sherryll Burger

## 2019-03-06 NOTE — Unmapped (Signed)
Physician Discharge Summary [facility]  [address] [facility]  [address]  [place] [address]  Dept: [phone]  Loc: [phone]     Identifying Information:   Marilyn French  [birth year]  [id number]    Primary Care Physician: Ignatius Specking, MD   Code Status: Full Code    Admit Date: 02/28/2019    Discharge Date: 03/06/2019     Discharge To: Home with Home Health and/or PT/OT    Discharge Service: MDW - GEN [facility] [place]     Discharge Attending Physician: Warren Lacy, MD    Discharge Diagnoses:  Principal Problem:    Hepatic encephalopathy (CMS-HCC) POA: Unknown  Active Problems:    NAFLD (nonalcoholic fatty liver disease) POA: Yes    Cirrhosis (CMS-HCC) POA: Yes    GAVE (gastric antral vascular ectasia) POA: Yes    Hypothyroid POA: Yes    Chronic blood loss anemia POA: Yes  Resolved Problems:    * No resolved hospital problems. *      Outpatient Provider Follow Up Issues:   [ ]  weekly CBC followed by transfusion if needed based on results  [ ]  repeat EGD with re-treatment of GAVE in 1 month at [facility]  [ ]  f/u with Dr. Ruffin Frederick in clinic    Hospital Course:   Marilyn French is a 63 y.o. female with a hx of NAFLD cirrhosis c/b hepatic encephalopathy and esophageal varices s/p TIPS with chronic blood loss anemia, HFpEF, T2DM, who was admitted to [facility] on 02/27/2019 with worsening encephalopathy. Hospital course by problem below:    Nonalcoholic cirrhosis c/b hepatic encephalopathy: patient presented with worsening encephalopathy in the setting of recent hospitalization for transfusion due to blood loss anemia. She had missed some doses of lactulose following discharge according to her husband, so likely that this was a large contributing factor. She was covered empirically for SBP and antibiotics were continued for prophylaxis in the setting of suspected upper GI bleed. There was no free fluid when diagnostic paracentesis was attempted and infectious workup was negative, however antibiotics were continued for prophylaxis in the setting of upper GI bleed. Mental status rapidly improved with lactulose. She was originally scheduled for EGD on 6/12 but this was deferred due to her encephalopathy. Hepatology was consulted and recommended EGD after mental status improved. EGD 6/15 showed GAVE with contact bleeding, which was treated with argon plasma coagulation. She was discharged with plan for outpatient follow up with GI and repeat EGD with re-treatment of GAVE 1 month after discharge.    Acute on chronic iron deficiency anemia secondary to chronic blood loss: patient is transfusion dependent (baseling hgb ~7) and prior to admission had just been admitted to OSH for transfusion of 3u of blood due to hgb of 4.8. Hgb on arrival 7.7. Her hemoglobin dropped to 5.7 on 6/14 she received 2 units PRBCs with appropriate response. She received another 1u PRBCs on 6/16 with appropriate response. EGD as above determined GAVE as he cause of her anemia and hemoglobin on day of discharge was 8.8. She was arranged follow up with local GI office for routine CBC with transfusion if she required one based on results.    Procedures:  EGD  No admission procedures for hospital encounter.  ______________________________________________________________________  Discharge Medications:     Your Medication List      CONTINUE taking these medications    busPIRone 10 MG tablet  Commonly known as:  BUSPAR  Take 1 tablet (10 mg total) by mouth two (  2) times a day.     cholecalciferol 1,000 unit (25 mcg) tablet  Generic drug:  cholecalciferol (vitamin D3)  Take 1,000 Units by mouth Mon,Wed,Fri (2000).     flintstones complete tablet  Generic drug:  pediatric multivit-iron-min  Chew 2 tablets daily.     lactulose 10 gram/15 mL solution  Commonly known as:  CHRONULAC  Take 20 g by mouth Four (4) times a day.     LANTUS SOLOSTAR U-100 INSULIN 100 unit/mL (3 mL) injection pen  Generic drug:  insulin glargine  Inject 0.45 mL (45 Units total) under the skin daily.     levothyroxine 25 MCG tablet  Commonly known as:  SYNTHROID  Take 25 mcg by mouth daily.     magnesium oxide 400 mg (241.3 mg magnesium) tablet  Commonly known as:  MAG-OX  Take 800 mg by mouth daily. Take for hand and/or leg cramping.     NovoLOG Flexpen U-100 Insulin 100 unit/mL (3 mL) injection pen  Generic drug:  insulin ASPART  Use with given sliding scale    CBG 70-120: 0 units  CBG 121-150: 1 unit  CBG 151-200: 2 units  CBG 201-250: 3 units  CBG 251-300: 5 units  CBG 301-350: 7 units  CBG greater than 351: 9 units     OXYGEN-AIR DELIVERY SYSTEMS MISC  Inhale 2 L nightly.     pantoprazole 40 MG tablet  Commonly known as:  PROTONIX  Take 40 mg by mouth daily.     pen needle, diabetic 31 gauge x 1/4 Ndle     potassium chloride 20 MEQ CR tablet  Commonly known as:  KLOR-CON  Take 20 mEq by mouth daily.     rifAXIMin 550 mg Tab  Commonly known as:  XIFAXAN  Take 1 tablet (550 mg total) by mouth Two (2) times a day.     rosuvastatin 5 MG tablet  Commonly known as:  CRESTOR  Take 5 mg by mouth every other day.     sertraline 50 MG tablet  Commonly known as:  ZOLOFT  Take 1 tablet (50 mg total) by mouth daily. Please take 1/2 tablet (25mg ) daily by mouth for 4 days, then increase to 1 tablet (50mg ) by mouth daily.     spironolactone 50 MG tablet  Commonly known as:  ALDACTONE  Take 1 tablet (50 mg total) by mouth daily.     torsemide 20 MG tablet  Commonly known as:  DEMADEX  Take 20 mg by mouth daily.            Allergies:  Ferumoxytol and Tramadol hcl  ______________________________________________________________________  Pending Test Results (if blank, then none):      Most Recent Labs:  All lab results last 24 hours -   Recent Results (from the past 24 hour(s))   POCT Glucose    Collection Time: 03/05/19  5:07 PM   Result Value Ref Range    Glucose, POC 435 (HH) 70 - 179 mg/dL   POCT Glucose    Collection Time: 03/05/19  9:05 PM   Result Value Ref Range    Glucose, POC 441 (HH) 70 - 179 mg/dL   POCT Glucose    Collection Time: 03/06/19 12:20 AM   Result Value Ref Range    Glucose, POC 429 (HH) 70 - 179 mg/dL   PT-INR    Collection Time: 03/06/19  6:34 AM   Result Value Ref Range    PT 19.3 (H) 10.2 - 13.1 sec  INR 1.66    Comprehensive Metabolic Panel    Collection Time: 03/06/19  6:34 AM   Result Value Ref Range    Sodium 137 135 - 145 mmol/L    Potassium 4.2 3.5 - 5.0 mmol/L    Chloride 103 98 - 107 mmol/L    Anion Gap 10 7 - 15 mmol/L    CO2 24.0 22.0 - 30.0 mmol/L    BUN 20 7 - 21 mg/dL    Creatinine 4.54 0.98 - 1.00 mg/dL    BUN/Creatinine Ratio 24     EGFR CKD-EPI Non-African American, Female 74 >=60 mL/min/1.51m2    EGFR CKD-EPI African American, Female 63 >=60 mL/min/1.42m2    Glucose 264 (H) 70 - 179 mg/dL    Calcium 8.4 (L) 8.5 - 10.2 mg/dL    Albumin 2.7 (L) 3.5 - 5.0 g/dL    Total Protein 5.1 (L) 6.5 - 8.3 g/dL    Total Bilirubin 2.2 (H) 0.0 - 1.2 mg/dL    AST 43 (H) 14 - 38 U/L    ALT 27 <35 U/L    Alkaline Phosphatase 60 38 - 126 U/L   Magnesium Level    Collection Time: 03/06/19  6:34 AM   Result Value Ref Range    Magnesium 1.8 1.6 - 2.2 mg/dL   CBC    Collection Time: 03/06/19  6:35 AM   Result Value Ref Range    WBC 4.2 (L) 4.5 - 11.0 10*9/L    RBC 2.88 (L) 4.00 - 5.20 10*12/L    HGB 8.5 (L) 12.0 - 16.0 g/dL    HCT 11.9 (L) 14.7 - 46.0 %    MCV 90.0 80.0 - 100.0 fL    MCH 29.3 26.0 - 34.0 pg    MCHC 32.6 31.0 - 37.0 g/dL    RDW 82.9 (H) 56.2 - 15.0 %    MPV 13.6 (H) 7.0 - 10.0 fL    Platelet 38 (L) 150 - 440 10*9/L   POCT Glucose    Collection Time: 03/06/19  7:42 AM   Result Value Ref Range    Glucose, POC 250 (H) 70 - 179 mg/dL   POCT Glucose    Collection Time: 03/06/19 12:23 PM   Result Value Ref Range    Glucose, POC 358 (H) 70 - 179 mg/dL       Relevant Studies/Radiology (if blank, then none):  Xr Chest 2 Views    Result Date: 02/28/2019  EXAM: XR CHEST 2 VIEWS DATE: 02/28/2019 1:10 AM ACCESSION: [id number] UN DICTATED: 02/28/2019 8:31 AM INTERPRETATION LOCATION: [facility] CLINICAL INDICATION: 63 years old Female with ALTERED MENTAL STATUS  COMPARISON: Chest radiograph 01/02/2018 TECHNIQUE: PA and Lateral Chest Radiographs. FINDINGS: Hypoinflated lungs. Mild interstitial thickening and prominent/indistinct pulmonary vasculature suggestive of mild pulmonary edema. No pleural effusion or pneumothorax. Cardiomegaly, increased from previous study.     Cardiomegaly and mild pulmonary edema. The heart size is increased from previous study.    Ct Abdomen Pelvis W Contrast    Result Date: 03/06/2019  EXAM: CT ABDOMEN PELVIS W CONTRAST DATE: 03/06/2019 2:31 AM ACCESSION: [id number] UN DICTATED: 03/06/2019 2:45 AM INTERPRETATION LOCATION: [facility] CLINICAL INDICATION: Liver transplant workup  COMPARISON: MRI abdomen 02/21/2019. TECHNIQUE: A spiral CT scan of the abdomen and pelvis was obtained with IV contrast from the lung bases through the pubic symphysis. Images were reconstructed in the axial plane. Coronal and sagittal reformatted images were also provided for further evaluation. FINDINGS: LOWER THORAX: Left basilar opacities, likely atelectasis. HEPATOBILIARY: Cirrhotic liver  morphology with nodular hepatic contour and left hepatic hypertrophy. Gallbladder is absent. No biliary dilatation.  SPLEEN: Massive splenomegaly measuring 19.3 cm craniocaudal, similar to prior. PANCREAS: Unremarkable. ADRENALS: Unremarkable. KIDNEYS/URETERS: Mass effect in the left kidney by the enlarged spleen. The kidneys enhance symmetrically. No hydronephrosis bilaterally. BLADDER: Underdistended, otherwise unremarkable. PELVIC/REPRODUCTIVE ORGANS: Uterus surgically absent. No adnexal masses. GI TRACT: The stomach is distended with fluid and debris. No dilated or thick walled loops of bowel. No bowel obstruction. PERITONEUM/RETROPERITONEUM AND MESENTERY: Mild mesenteric edema. No intracranial free air. Trace ascites. LYMPH NODES: No enlarged lymph nodes. VESSELS: The abdominal aorta normal in caliber with mild atherosclerotic calcification. Large retroperitoneal varices are again noted. IVC unremarkable. TIPS stent extending from the right portal vein into the right hepatic vein. Likely chronic near occlusive thrombus noted within the left portal vein (3:21) and main portal vein just proximal to the bifurcation. Paraesophageal and gastrohepatic varices again noted. BONES AND SOFT TISSUES: Diffuse body wall edema. No acute osseous abnormality.     Cirrhosis with sequela of portal hypertension status post TIPS stent placement. No substantial change in massive splenomegaly, large caliber upper abdominal varices and trace ascites. Similar appearance of chronic near occlusive thrombus in the left portal vein branches and main portal vein just proximal to the bifurcation.    ______________________________________________________________________  Discharge Instructions:           Other Instructions     Call MD for:      Bloody stools or dark/tarry black stools         Call MD for:  extreme fatigue      Call MD for:  persistent dizziness or light-headedness      Call MD for:  persistent nausea or vomiting      Discharge instructions      You were admitted to [facility] for confusion and worsening low blood counts. Your confusion greatly improved with lactulose. Your EGD showed bleeding due to a condition known as gastric antral valvular ectasia, which causes bleeding in the stomach. The GI physicians performed coagulation to help stop this bleeding. They would like you to have the same procedure done again in one month to help treat the bleeding. They would also like you to have routine blood checks to see if you need transfusions. Please see a summary of your follow ups below.    1) Follow up at Dr. Patty Sermons office 03/11/19 @ 9:45am  2) You will be contacted to set up an appointment with Dr. Ruffin Frederick. If you have not heard about this appointment by 6/24, please call [phone] to inquire about this appointment  3) You will be contacted to arrange another EGD here at [facility] in about 1 month               Follow Up instructions and Outpatient Referrals     Ambulatory referral to Home Health      Is this a [facility] or [facility] referral?:  No    Physician to follow patient's care:  PCP    Disciplines requested:   Nursing  Physical Therapy  Occupational Therapy       Nursing requested:  Other: (please enter in comments)    Physical Therapy requested:  Evaluate and treat    Occupational Therapy Requested:  Evaluate and treat    Requested start of care date:  Other: (please enter in comments)    Call MD for:      Call MD for:  extreme fatigue  Call MD for:  persistent dizziness or light-headedness      Call MD for:  persistent nausea or vomiting      Discharge instructions              ______________________________________________________________________  Discharge Day Services:  BP 136/63  - Pulse 75  - Temp 35.4 ??C (95.7 ??F) (Oral)  - Resp 17  - Ht 157.5 cm (5' 2.01)  - Wt 93 kg (205 lb)  - LMP  (LMP Unknown)  - SpO2 97%  - BMI 37.49 kg/m??   Pt seen on the day of discharge and determined appropriate for discharge.    Condition at Discharge: fair    Length of Discharge: I spent greater than 30 mins in the discharge of this patient.

## 2019-03-06 NOTE — Unmapped (Signed)
Home health has been set up for through the agency listed below. The Home health agency will be contacting you to set up a time for them to come see you in your home within 2 days of your discharge.  If you have not heard from them prior to 03/08/19 or you have any questions about home health, please contact them at the phone number listed below.    [facility] - Selection Complete      Service Provider Request Status Selected Services Address Phone Number Fax Number    [facility] [address] [place], [place] [address] [phone]

## 2019-03-07 NOTE — Unmapped (Signed)
Mediation refill request.

## 2019-03-09 DIAGNOSIS — R069 Unspecified abnormalities of breathing: Secondary | ICD-10-CM | POA: Diagnosis not present

## 2019-03-10 MED ORDER — SERTRALINE 50 MG TABLET
ORAL_TABLET | Freq: Every day | ORAL | 1 refills | 0.00000 days | Status: CP
Start: 2019-03-10 — End: ?

## 2019-03-10 NOTE — Unmapped (Signed)
Patient has a care plan that requires asymptomatic respiratory testing prior to implementation.   The date 04/04/2019, time 11:15 am of the treatment, surgery, or procedure for which they need testing is EGD.   Please schedule a COVID PRE TEST appointment in [facility].     PATIENT WANTS TO BE TESTED IN [facility]OS[facility] TO HER HOME     [facility] [address], [place], [place] [address] on 04/02/19 @10 :00am      Patient was called to schedule Pre-Test for <04/02/19>    Did patient confirm appointment? Yes    [employer] message with appointment information.

## 2019-03-11 ENCOUNTER — Ambulatory Visit (INDEPENDENT_AMBULATORY_CARE_PROVIDER_SITE_OTHER): Payer: PPO | Admitting: Internal Medicine

## 2019-03-11 ENCOUNTER — Other Ambulatory Visit (INDEPENDENT_AMBULATORY_CARE_PROVIDER_SITE_OTHER): Payer: Self-pay | Admitting: *Deleted

## 2019-03-11 ENCOUNTER — Other Ambulatory Visit: Payer: Self-pay

## 2019-03-11 ENCOUNTER — Other Ambulatory Visit (HOSPITAL_COMMUNITY)
Admission: RE | Admit: 2019-03-11 | Discharge: 2019-03-11 | Disposition: A | Discharge status: Home / Self Care | Payer: PPO | Source: Ambulatory Visit | Attending: Internal Medicine | Admitting: Internal Medicine

## 2019-03-11 ENCOUNTER — Encounter (INDEPENDENT_AMBULATORY_CARE_PROVIDER_SITE_OTHER): Payer: Self-pay | Admitting: Internal Medicine

## 2019-03-11 VITALS — BP 128/47 | HR 82 | Temp 98.7°F | Ht 64.0 in | Wt 187.9 lb

## 2019-03-11 DIAGNOSIS — K760 Fatty (change of) liver, not elsewhere classified: Secondary | ICD-10-CM

## 2019-03-11 DIAGNOSIS — D500 Iron deficiency anemia secondary to blood loss (chronic): Secondary | ICD-10-CM

## 2019-03-11 DIAGNOSIS — K7469 Other cirrhosis of liver: Secondary | ICD-10-CM

## 2019-03-11 DIAGNOSIS — K7030 Alcoholic cirrhosis of liver without ascites: Secondary | ICD-10-CM | POA: Diagnosis not present

## 2019-03-11 LAB — CBC
HCT: 30.5 % — ABNORMAL LOW (ref 36.0–46.0)
Hemoglobin: 9.1 g/dL — ABNORMAL LOW (ref 12.0–15.0)
MCH: 28.6 pg (ref 26.0–34.0)
MCHC: 29.8 g/dL — ABNORMAL LOW (ref 30.0–36.0)
MCV: 95.9 fL (ref 80.0–100.0)
Platelets: UNDETERMINED 10*3/uL (ref 150–400)
RBC: 3.18 MIL/uL — ABNORMAL LOW (ref 3.87–5.11)
RDW: 20.7 % — ABNORMAL HIGH (ref 11.5–15.5)
WBC: 3.5 10*3/uL — ABNORMAL LOW (ref 4.0–10.5)
nRBC: 0 % (ref 0.0–0.2)

## 2019-03-11 LAB — COMPREHENSIVE METABOLIC PANEL
ALT: 25 U/L (ref 0–44)
AST: 34 U/L (ref 15–41)
Albumin: 3 g/dL — ABNORMAL LOW (ref 3.5–5.0)
Alkaline Phosphatase: 80 U/L (ref 38–126)
Anion gap: 9 (ref 5–15)
BUN: 15 mg/dL (ref 8–23)
CO2: 27 mmol/L (ref 22–32)
Calcium: 8.3 mg/dL — ABNORMAL LOW (ref 8.9–10.3)
Chloride: 105 mmol/L (ref 98–111)
Creatinine, Ser: 0.92 mg/dL (ref 0.44–1.00)
GFR calc Af Amer: >60 mL/min (ref >60)
GFR calc non Af Amer: >60 mL/min (ref >60)
Glucose, Bld: 292 mg/dL — ABNORMAL HIGH (ref 70–99)
Potassium: 3.8 mmol/L (ref 3.5–5.1)
Sodium: 141 mmol/L (ref 135–145)
Total Bilirubin: 2.6 mg/dL — ABNORMAL HIGH (ref 0.3–1.2)
Total Protein: 5.1 g/dL — ABNORMAL LOW (ref 6.5–8.1)

## 2019-03-11 LAB — AMMONIA: Ammonia: 78 umol/L — ABNORMAL HIGH (ref 9–35)

## 2019-03-11 NOTE — Progress Notes (Signed)
cbc

## 2019-03-11 NOTE — Patient Instructions (Signed)
Labs today

## 2019-03-11 NOTE — Progress Notes (Signed)
Subjective:    Patient ID: Maria [name], female    DOB: November 11, 1955, 63 y.o.   MRN: [id number]  HPI Here today for f/u. Hx of NAFLD/cirrhosis, GAVE. Recently seen at [facility] 03/05/2019 and underwent and EGD by Dr. Erven Colla which showed GAVE with contact bleeding, treated with argon plasma coagulation. She says she was admitted to [facility] and received a total of 7 units of blood while there.  Plans for f/u with GI at [facility] with re-treatment 1 month after discharge.  Hx of hepatic encephalopathy and maintained on Xifaxan and Lactulose. Says stools are green,gray and light brown.  Last weight May 11,2020 was 205. Today her weight is 187.9.  CBC    Component Value Date/Time   WBC 4.7 02/27/2019 0732   RBC 2.38 (L) 02/27/2019 0732   HGB 8.0 (L) 02/27/2019 1945   HGB 8.0 (L) 11/13/2018 1433   HCT 25.7 (L) 02/27/2019 1945   HCT 27.3 (L) 11/13/2018 1433   PLT 46 (L) 02/27/2019 0732   MCV 89.5 02/27/2019 0732   MCH 27.7 02/27/2019 0732   MCHC 31.0 02/27/2019 0732   RDW 17.6 (H) 02/27/2019 0732   LYMPHSABS 1.3 02/27/2019 0732   MONOABS 0.5 02/27/2019 0732   EOSABS 0.1 02/27/2019 0732   BASOSABS 0.0 02/27/2019 0732   CMP Latest Ref Rng & Units 02/27/2019 02/26/2019 02/25/2019  Glucose 70 - 99 mg/dL 112(H) 132(H) 168(H)  BUN 8 - 23 mg/dL 24(H) 26(H) 21  Creatinine 0.44 - 1.00 mg/dL 0.98 0.99 0.95  Sodium 135 - 145 mmol/L 142 141 139  Potassium 3.5 - 5.1 mmol/L 3.6 3.8 4.1  Chloride 98 - 111 mmol/L 113(H) 113(H) 114(H)  CO2 22 - 32 mmol/L 22 22 18(L)  Calcium 8.9 - 10.3 mg/dL 8.2(L) 8.2(L) 8.0(L)  Total Protein 6.5 - 8.1 g/dL - 4.7(L) -  Total Bilirubin 0.3 - 1.2 mg/dL - 3.0(H) -  Alkaline Phos 38 - 126 U/L - 74 -  AST 15 - 41 U/L - 27 -  ALT 0 - 44 U/L - 24 -        Review of Systems Past Medical History:  Diagnosis Date   Anemia    CHF (congestive heart failure) (HCC)    Cirrhosis (HCC)    Depression    Diabetes mellitus    Esophageal bleed, non-variceal      Eye hemorrhage    Behind left eye   Fibromyalgia    Hypercholesteremia    Hypertension    Hypothyroidism    NAFLD (nonalcoholic fatty liver disease)    Osteoarthrosis    Osteoporosis    PONV (postoperative nausea and vomiting)    UTI (urinary tract infection) 11/12 end of the month   Patient feels that she passed a kidney stone at that time    Past Surgical History:  Procedure Laterality Date   APPENDECTOMY  1980   BILATERAL SALPINGOOPHORECTOMY     CHOLECYSTECTOMY     COLONOSCOPY  03/15/2011   COLONOSCOPY N/A 03/24/2016   Procedure: COLONOSCOPY;  Surgeon: [name], MD;  [facility];  Service: Endoscopy;  Laterality: N/A;  855   ESOPHAGEAL BANDING  04/24/2012   Procedure: ESOPHAGEAL BANDING;  Surgeon: [name], MD;  [facility];  Service: Endoscopy;  Laterality: N/A;   Esophageal BANDING  08/03/2012   [facility] in [place] , [place]  09/24/2012   Procedure: ESOPHAGEAL BANDING;  Surgeon: [name], MD;  Location: [facility];  Service: Endoscopy;  Laterality: N/A;  Banding x 3   ESOPHAGEAL BANDING N/A 01/29/2013   Procedure: ESOPHAGEAL BANDING;  Surgeon: [name], MD;  [facility];  Service: Endoscopy;  Laterality: N/A;   ESOPHAGEAL BANDING N/A 06/19/2014   Procedure: ESOPHAGEAL BANDING;  Surgeon: [name], MD;  [facility];  Service: Endoscopy;  Laterality: N/A;   ESOPHAGEAL BANDING N/A 01/05/2016   Procedure: ESOPHAGEAL BANDING;  Surgeon: [name], MD;  [facility];  Service: Endoscopy;  Laterality: N/A;   ESOPHAGEAL BANDING N/A 08/03/2016   Procedure: ESOPHAGEAL BANDING;  Surgeon: [name], MD;  [facility];  Service: Endoscopy;  Laterality: N/A;   ESOPHAGEAL BANDING N/A 05/02/2017   Procedure: ESOPHAGEAL BANDING;  Surgeon: [name], MD;  [facility];  Service: Endoscopy;  Laterality: N/A;    ESOPHAGOGASTRODUODENOSCOPY  04/24/2012   Procedure: ESOPHAGOGASTRODUODENOSCOPY (EGD);  Surgeon: [name], MD;  [facility];  Service: Endoscopy;  Laterality: N/A;  300   ESOPHAGOGASTRODUODENOSCOPY N/A 01/29/2013   Procedure: ESOPHAGOGASTRODUODENOSCOPY (EGD);  Surgeon: [name], MD;  [facility];  Service: Endoscopy;  Laterality: N/A;  1200   ESOPHAGOGASTRODUODENOSCOPY N/A 05/22/2013   Procedure: ESOPHAGOGASTRODUODENOSCOPY (EGD);  Surgeon: [name], MD;  [facility];  Service: Endoscopy;  Laterality: N/A;  1:55   ESOPHAGOGASTRODUODENOSCOPY N/A 12/31/2013   Procedure: ESOPHAGOGASTRODUODENOSCOPY (EGD);  Surgeon: [name], MD;  [facility];  Service: Endoscopy;  Laterality: N/A;  1200   ESOPHAGOGASTRODUODENOSCOPY N/A 06/19/2014   Procedure: ESOPHAGOGASTRODUODENOSCOPY (EGD);  Surgeon: [name], MD;  [facility];  Service: Endoscopy;  Laterality: N/A;  1055   ESOPHAGOGASTRODUODENOSCOPY N/A 01/15/2015   Procedure: ESOPHAGOGASTRODUODENOSCOPY (EGD);  Surgeon: [name], MD;  [facility];  Service: Endoscopy;  Laterality: N/A;  730 - moved to 9:45 - moved to 1250-Ann notified pt   ESOPHAGOGASTRODUODENOSCOPY N/A 06/30/2015   Procedure: ESOPHAGOGASTRODUODENOSCOPY (EGD);  Surgeon: [name], MD;  [facility];  Service: Endoscopy;  Laterality: N/A;  1200   ESOPHAGOGASTRODUODENOSCOPY N/A 01/05/2016   Procedure: ESOPHAGOGASTRODUODENOSCOPY (EGD);  Surgeon: [name], MD;  [facility];  Service: Endoscopy;  Laterality: N/A;  830   ESOPHAGOGASTRODUODENOSCOPY N/A 08/03/2016   Procedure: ESOPHAGOGASTRODUODENOSCOPY (EGD);  Surgeon: [name], MD;  [facility];  Service: Endoscopy;  Laterality: N/A;  930   ESOPHAGOGASTRODUODENOSCOPY N/A 05/02/2017   Procedure: ESOPHAGOGASTRODUODENOSCOPY (EGD);  Surgeon: [name], MD;  [facility];   Service: Endoscopy;  Laterality: N/A;  12:00   ESOPHAGOGASTRODUODENOSCOPY N/A 08/17/2017   Procedure: ESOPHAGOGASTRODUODENOSCOPY (EGD);  Surgeon: [name], MD;  [facility];  Service: Endoscopy;  Laterality: N/A;  8:15   ESOPHAGOGASTRODUODENOSCOPY N/A 09/12/2017   Procedure: ESOPHAGOGASTRODUODENOSCOPY (EGD);  Surgeon: [name], MD;  [facility];  Service: Endoscopy;  Laterality: N/A;  1040   ESOPHAGOGASTRODUODENOSCOPY N/A 10/10/2017   Procedure: ESOPHAGOGASTRODUODENOSCOPY (EGD);  Surgeon: [name], MD;  [facility];  Service: Endoscopy;  Laterality: N/A;  225   ESOPHAGOGASTRODUODENOSCOPY N/A 11/09/2017   Procedure: ESOPHAGOGASTRODUODENOSCOPY (EGD);  Surgeon: [name], MD;  [facility];  Service: Endoscopy;  Laterality: N/A;   ESOPHAGOGASTRODUODENOSCOPY (EGD) WITH PROPOFOL  09/24/2012   Procedure: ESOPHAGOGASTRODUODENOSCOPY (EGD) WITH PROPOFOL;  Surgeon: [name], MD;  [facility];  Service: Endoscopy;  Laterality: N/A;  GE junction at 36   ESOPHAGOGASTRODUODENOSCOPY (EGD) WITH PROPOFOL N/A 07/06/2017   Procedure: ESOPHAGOGASTRODUODENOSCOPY (EGD) WITH PROPOFOL;  Surgeon: [name], MD;  [facility];  Service: Endoscopy;  Laterality: N/A;   ESOPHAGOGASTRODUODENOSCOPY W/ BANDING  08/2010   GIVENS CAPSULE STUDY N/A 07/05/2017   Procedure: GIVENS CAPSULE STUDY;  Surgeon: [name], MD;  [facility];  Service: Endoscopy;  Laterality: N/A;   HOT HEMOSTASIS N/A 08/17/2017   Procedure: HOT HEMOSTASIS (ARGON PLASMA COAGULATION/BICAP);  Surgeon: [name], MD;  [facility];  Service: Endoscopy;  Laterality: N/A;   HOT HEMOSTASIS  09/12/2017   Procedure: HOT HEMOSTASIS (ARGON PLASMA COAGULATION/BICAP);  Surgeon: [name], MD;  [facility];  Service: Endoscopy;;  gastric   HOT HEMOSTASIS  10/10/2017   Procedure: HOT HEMOSTASIS (ARGON  PLASMA COAGULATION/BICAP);  Surgeon: [name], MD;  [facility];  Service: Endoscopy;;   POLYPECTOMY  03/24/2016   Procedure: POLYPECTOMY;  Surgeon: [name], MD;  [facility];  Service: Endoscopy;;  sigmoid polyp   RIGHT HEART CATH N/A 04/16/2017   Procedure: Right Heart Cath;  Surgeon: [name], MD;  Location: [place] [facility];  Service: Cardiovascular;  Laterality: N/A;   RIGHT HEART CATH N/A 07/12/2017   Procedure: RIGHT HEART CATH;  Surgeon: [name], MD;  Location: [place] [facility];  Service: Cardiovascular;  Laterality: N/A;   TONSILLECTOMY     UPPER GASTROINTESTINAL ENDOSCOPY  03/15/2011   EGD ED BANDING/TCS   UPPER GASTROINTESTINAL ENDOSCOPY  09/05/2010   UPPER GASTROINTESTINAL ENDOSCOPY  08/11/2010   VAGINAL HYSTERECTOMY      Allergies  Allergen Reactions   Ferumoxytol Other (See Comments)    Patient has severe back and chest pain when receiving FERAHEME infusions. Patient has severe back and chest pain when receiving FERAHEME infusions.   Tramadol Hcl Other (See Comments)    WEAKNESS WEAKNESS    Current Outpatient Medications on File Prior to Visit  Medication Sig Dispense Refill   busPIRone (BUSPAR) 5 MG tablet Take 10 mg by mouth 2 (two) times daily.      cholecalciferol (VITAMIN D) 1000 UNITS tablet Take 1,000 Units by mouth 3 (three) times a week.      HUMALOG 100 UNIT/ML injection Inject 12-20 Units into the skin 3 (three) times daily with meals.   6   Insulin Glargine (LANTUS SOLOSTAR) 100 UNIT/ML Solostar Pen Inject 40 Units into the skin daily at 10 pm. (Patient taking differently: Inject 45 Units into the skin every morning. )     Insulin Pen Needle 31G X 5 MM MISC Use with each insulin injection 100 each 12   lactulose (CHRONULAC) 10 GM/15ML solution Take 45 mLs (30 g total) by mouth 3 (three) times daily.     levothyroxine (SYNTHROID, LEVOTHROID) 25 MCG tablet Take 25 mcg by mouth daily  before breakfast.      magnesium oxide (MAG-OX) 400 MG tablet Take 400 mg by mouth daily.      multivitamin (VIT W/EXTRA C) CHEW chewable tablet Chew 1 tablet by mouth daily.     OXYGEN Inhale 2 L into the lungs at bedtime.      pantoprazole (PROTONIX) 40 MG tablet Take 1 tablet (40 mg total) by mouth 2 (two) times daily. 180 tablet 3   potassium chloride (K-DUR) 10 MEQ tablet Take 1 tablet (10 mEq total) by mouth 2 (two) times daily. 180 tablet  3   rifaximin (XIFAXAN) 550 MG TABS tablet Take 1 tablet (550 mg total) by mouth 2 (two) times daily.     sertraline (ZOLOFT) 50 MG tablet Take 50 mg by mouth daily.     spironolactone (ALDACTONE) 50 MG tablet TAKE 1 TABLET BY MOUTH TWICE DAILY 60 tablet 2   torsemide (DEMADEX) 20 MG tablet Take 1 tablet (20 mg total) by mouth daily. Please call for OV [phone] 30 tablet 2   trimethoprim-polymyxin b (POLYTRIM) ophthalmic solution Place 1 drop into the left eye See admin instructions. Use 3 to 4 times daily for 2 days following monthly eye injection  12   zinc sulfate 220 (50 Zn) MG capsule Take 1 capsule (220 mg total) by mouth daily. 30 capsule 1   No current facility-administered medications on file prior to visit.         Objective:   Physical Exam Blood pressure (!) 128/47, pulse 82, temperature 98.7 F (37.1 C), height 5' 4"  (1.626 m), weight 187 lb 14.4 oz (85.2 kg). Alert and oriented. Skin warm and dry. Oral mucosa is moist.   . Sclera anicteric, conjunctivae is pink. Thyroid not enlarged. No cervical lymphadenopathy. Lungs clear. Heart regular rate and rhythm.  Abdomen is soft. Bowel sounds are positive. No hepatomegaly. No abdominal masses felt. No tenderness.  No edema to lower extremities.         Assessment & Plan:  UGI bleed, GAVE: Will get a CBC CMET,. Recently underwent an EGD at [facility]. Hepatic Encephalopathy: will get an ammonia level and Hepatic.

## 2019-03-11 NOTE — Unmapped (Signed)
Call placed to [facility] regarding an appt.  Marilyn French verbally agreed to attend her appt with the surgeon 7/6.

## 2019-03-13 ENCOUNTER — Encounter (INDEPENDENT_AMBULATORY_CARE_PROVIDER_SITE_OTHER): Payer: PPO | Admitting: Ophthalmology

## 2019-03-13 ENCOUNTER — Other Ambulatory Visit: Payer: Self-pay

## 2019-03-13 DIAGNOSIS — E113291 Type 2 diabetes mellitus with mild nonproliferative diabetic retinopathy without macular edema, right eye: Secondary | ICD-10-CM | POA: Diagnosis not present

## 2019-03-13 DIAGNOSIS — H43813 Vitreous degeneration, bilateral: Secondary | ICD-10-CM | POA: Diagnosis not present

## 2019-03-13 DIAGNOSIS — E113312 Type 2 diabetes mellitus with moderate nonproliferative diabetic retinopathy with macular edema, left eye: Secondary | ICD-10-CM | POA: Diagnosis not present

## 2019-03-13 DIAGNOSIS — H35033 Hypertensive retinopathy, bilateral: Secondary | ICD-10-CM

## 2019-03-13 DIAGNOSIS — I10 Essential (primary) hypertension: Secondary | ICD-10-CM

## 2019-03-13 DIAGNOSIS — E11311 Type 2 diabetes mellitus with unspecified diabetic retinopathy with macular edema: Secondary | ICD-10-CM | POA: Diagnosis not present

## 2019-03-18 ENCOUNTER — Other Ambulatory Visit (INDEPENDENT_AMBULATORY_CARE_PROVIDER_SITE_OTHER): Payer: Self-pay | Admitting: *Deleted

## 2019-03-18 ENCOUNTER — Other Ambulatory Visit (HOSPITAL_COMMUNITY)
Admission: RE | Admit: 2019-03-18 | Discharge: 2019-03-18 | Disposition: A | Discharge status: Home / Self Care | Payer: PPO | Source: Ambulatory Visit | Attending: Internal Medicine | Admitting: Internal Medicine

## 2019-03-18 ENCOUNTER — Telehealth (INDEPENDENT_AMBULATORY_CARE_PROVIDER_SITE_OTHER): Payer: Self-pay | Admitting: *Deleted

## 2019-03-18 DIAGNOSIS — K7682 Hepatic encephalopathy: Secondary | ICD-10-CM

## 2019-03-18 DIAGNOSIS — K7290 Hepatic failure, unspecified without coma: Secondary | ICD-10-CM

## 2019-03-18 DIAGNOSIS — K922 Gastrointestinal hemorrhage, unspecified: Secondary | ICD-10-CM

## 2019-03-18 DIAGNOSIS — K7460 Unspecified cirrhosis of liver: Secondary | ICD-10-CM | POA: Diagnosis not present

## 2019-03-18 DIAGNOSIS — K31819 Angiodysplasia of stomach and duodenum without bleeding: Secondary | ICD-10-CM

## 2019-03-18 LAB — HEMOGLOBIN AND HEMATOCRIT, BLOOD
HCT: 29.9 % — ABNORMAL LOW (ref 36.0–46.0)
Hemoglobin: 8.8 g/dL — ABNORMAL LOW (ref 12.0–15.0)

## 2019-03-18 LAB — SAMPLE TO BLOOD BANK

## 2019-03-18 LAB — AMMONIA: Ammonia: 77 umol/L — ABNORMAL HIGH (ref 9–35)

## 2019-03-18 NOTE — Telephone Encounter (Signed)
Patient was called and given her results to todays lab work.HGB was 8.8 , ammonia level was 77.  Patient says that she is tired. Not sleeping at night due to having bowel movements. She says that her husband is monitoring all her medication and her very carefully.  She reports dark stool yesterday and last night. She is to see a Psychologist, sport and exercise on 03/24/19. Next procedure is 04/04/19.  Plan to address with Dr.Rehman on 03/19/19.

## 2019-03-19 DIAGNOSIS — K7460 Unspecified cirrhosis of liver: Secondary | ICD-10-CM | POA: Diagnosis not present

## 2019-03-19 DIAGNOSIS — E1165 Type 2 diabetes mellitus with hyperglycemia: Secondary | ICD-10-CM | POA: Diagnosis not present

## 2019-03-19 DIAGNOSIS — Z299 Encounter for prophylactic measures, unspecified: Secondary | ICD-10-CM | POA: Diagnosis not present

## 2019-03-19 DIAGNOSIS — I10 Essential (primary) hypertension: Secondary | ICD-10-CM | POA: Diagnosis not present

## 2019-03-19 DIAGNOSIS — I8500 Esophageal varices without bleeding: Secondary | ICD-10-CM | POA: Diagnosis not present

## 2019-03-19 DIAGNOSIS — Z6832 Body mass index (BMI) 32.0-32.9, adult: Secondary | ICD-10-CM | POA: Diagnosis not present

## 2019-03-19 DIAGNOSIS — D649 Anemia, unspecified: Secondary | ICD-10-CM | POA: Diagnosis not present

## 2019-03-19 NOTE — Unmapped (Signed)
err

## 2019-03-24 ENCOUNTER — Ambulatory Visit: Admit: 2019-03-24 | Discharge: 2019-03-25 | Payer: PRIVATE HEALTH INSURANCE

## 2019-03-24 DIAGNOSIS — Z01818 Encounter for other preprocedural examination: Principal | ICD-10-CM

## 2019-03-24 DIAGNOSIS — I81 Portal vein thrombosis: Secondary | ICD-10-CM | POA: Diagnosis not present

## 2019-03-24 DIAGNOSIS — K760 Fatty (change of) liver, not elsewhere classified: Secondary | ICD-10-CM | POA: Diagnosis not present

## 2019-03-24 DIAGNOSIS — K7469 Other cirrhosis of liver: Secondary | ICD-10-CM | POA: Diagnosis not present

## 2019-03-24 NOTE — Unmapped (Signed)
Transplant Surgery History and Physical      Assessment/Recommendations:    Marilyn French is a 63 y.o. female seen in consultation at the request of Marilyn Burger Vinie Sill, MD for evaluation of candidacy for transplantation.    I spent 45 minutes with the patient obtaining the above history and physical examination, and greater than 50% of the time was spent counseling and on the substance of the discussion.    Today we discussed liver transplantation going over the surgery to be performed, the hospital course including length of stay, anti-rejection medications and their side effects, results and the cadaveric donor system.    I discussed in detail with Marilyn French the risks and benefits of liver transplantation, including but not limited to: the general anesthetic, monitoring lines, the incision, the hepatectomy, as well as reimplantation of the liver graft and immunosuppressant medications. In regards to the surgical procedure, we noted that it is a major operation performed under general anesthesia with the risks of heart attack, stroke and death. Multiple invasive means of monitoring may be necessary during the operation including an arterial line, a central venous catheter, a foley catheter inserted into the bladder, and a tube from your nose into your stomach to prevent stomach distension. After surgery, the patient will go to the Intensive Care Unit and is then sent to the regular floor when medically stable. I discussed the possible complications including the need for reoperation for bleeding, infection or other complications, the possibility of clotting/leakage of blood vessels, requiring either radiological intervention, surgical intervention, or even retransplantation. I reviewed the possibility of complications involving the biliary tract including leaks, strictures and need for retransplantation for biliary complications. I discussed the possibility of primary nonfunction of the liver graft requiring urgent retransplantation or the result of death. The patient understands the need for long-term immunosuppression therapy as well as monitoring of labs and immunosuppression. Anti-rejection medications, including Prograf or Cyclosporine (Neoral), Cellcept, steroids and others, will be needed after transplantation and for the patient???s entire lifetime. Problems include infection, cancer, hirsutism, tremors, gum swelling, hypertension, bone fractures, aggravation of diabetes or new onset diabetes, cataracts, and rashes. Finally, the donor system was reviewed. All donors are tested for infections and other diseases, but there is a small chance of transmission of diseases including viruses as well as the possible transmission of tumors. Some patients may elect to receive a liver from a donor who was exposed to the Hepatitis B or Hepatitis C virus and the recipient may require certain anti-viral medications to prevent this virus from damaging the new liver.    Finally, I reviewed with the patient how the surgery is expected to improve their health and quality of life, that the average length of hospitalization stay is 10-12 days, and that the length of their expected recovery period, including when normal daily activities may be resumed, will be patient dependent.    Marilyn French had all their questions answered and wishes to proceed with the liver transplant evaluation process.    - She appears frail(though better than before), has a MELD of 15.  - needs to be re-evaluated in 3 months again  - Increase physical activity and regular exercises  - CT shows PVT in the left portal vein and main portal vein just proximal to the bifurcation.     This patient was seen and evaluated with Dr. Celine Mans with decision to re-evaluate her in 3 months.      HPI  Marilyn French a 63 y.o.??female??with NAFLD??cirrhosis  s/p TIPS procedure due to??blood loss??secondary??to portal hypertensive gastropathy/GAVE. She has had multiple episodes of UGI Endoscopy and banding in the past for varices. She presented with massive blood loss due to variceal bleeding which led a Hb drop to 6, and did not respond to blood transfusion. Her cirrhosis is also complicated by ascites and PVT. PVT was diagnosed in Dec 2018. However, due to the bleeding she was not started on anticoagulation for PVT. TIPS was done on 07/08/2018 and patient has had no bleeding episodes since then. At the time of TIPS around 5.8 L of ascites was drained too. After TIPS placement, she has had few  episode of hepatic encephalopathy when she has been admitted. She does complain of occasional melena.  She denies any abdominal pain, nausea, vomiting, hemetemesis, constipation, chest pain , SOB.    MELD Score: 15    Allergies    Ferumoxytol and Tramadol hcl      Medications      Current Outpatient Medications   Medication Sig Dispense Refill   ??? busPIRone (BUSPAR) 10 MG tablet Take 1 tablet (10 mg total) by mouth two (2) times a day. 60 tablet 3   ??? cholecalciferol, vitamin D3, (CHOLECALCIFEROL) 1,000 unit tablet Take 1,000 Units by mouth Mon,Wed,Fri (2000).     ??? insulin ASPART (NOVOLOG FLEXPEN U-100 INSULIN) 100 unit/mL injection pen Use with given sliding scale    CBG 70-120: 0 units  CBG 121-150: 1 unit  CBG 151-200: 2 units  CBG 201-250: 3 units  CBG 251-300: 5 units  CBG 301-350: 7 units  CBG greater than 351: 9 units     ??? insulin glargine (LANTUS SOLOSTAR U-100 INSULIN) 100 unit/mL (3 mL) injection pen Inject 0.45 mL (45 Units total) under the skin daily. 1350 Units 0   ??? lactulose (CHRONULAC) 10 gram/15 mL solution Take 20 g by mouth Four (4) times a day.      ??? levothyroxine (SYNTHROID, LEVOTHROID) 25 MCG tablet Take 25 mcg by mouth daily.     ??? magnesium oxide (MAG-OX) 400 mg (241.3 mg magnesium) tablet Take 800 mg by mouth daily. Take for hand and/or leg cramping.      ??? OXYGEN-AIR DELIVERY SYSTEMS MISC Inhale 2 L nightly.     ??? pantoprazole (PROTONIX) 40 MG tablet Take 40 mg by mouth daily.     ??? pediatric multivit-iron-min (FLINTSTONES COMPLETE) tablet Chew 2 tablets daily.      ??? pen needle, diabetic 31 gauge x 1/4 Ndle      ??? potassium chloride SA (K-DUR,KLOR-CON) 20 MEQ tablet Take 20 mEq by mouth daily.      ??? rifAXIMin (XIFAXAN) 550 mg Tab Take 1 tablet (550 mg total) by mouth Two (2) times a day. 60 tablet 11   ??? rosuvastatin (CRESTOR) 5 MG tablet Take 5 mg by mouth every other day.     ??? sertraline (ZOLOFT) 50 MG tablet Take 1 tablet (50 mg total) by mouth daily. Please take 1/2 tablet (25mg ) daily by mouth for 4 days, then increase to 1 tablet (50mg ) by mouth daily. 30 tablet 1   ??? spironolactone (ALDACTONE) 50 MG tablet Take 1 tablet (50 mg total) by mouth daily. 30 tablet 0   ??? torsemide (DEMADEX) 20 MG tablet Take 20 mg by mouth daily.       No current facility-administered medications for this visit.     (Not in a hospital admission)        Past Medical History  Past Medical History:   Diagnosis Date   ??? Cirrhosis (CMS-HCC)    ??? Depression    ??? Diabetes mellitus (CMS-HCC)    ??? Fibromyalgia    ??? GAVE (gastric antral vascular ectasia)    ??? HTN (hypertension)    ??? Hypercholesterolemia    ??? Hypothyroid    ??? NAFLD (nonalcoholic fatty liver disease)    ??? Osteoporosis          Past Surgical History    Past Surgical History:   Procedure Laterality Date   ??? APPENDECTOMY     ??? CHOLECYSTECTOMY     ??? IR TIPS  07/08/2018    IR TIPS 07/08/2018 Soledad Gerlach, MD IMG VIR [facility]   ??? PR RIGHT HEART CATH O2 SATURATION & CARDIAC OUTPUT Right 01/18/2018    Procedure: Right Heart Catheterization;  Surgeon: Marlaine Hind, MD;  Location: [facility] C[facility];  Service: Cardiology   ??? PR UPPER GI ENDOSCOPY,CTRL BLEED N/A 03/05/2019    Procedure: UGI ENDOSCOPY; WITH CONTROL OF BLEEDING, ANY METHOD;  Surgeon: Andrey Farmer, MD;  [facility];  Service: Gastroenterology   ??? PR UPPER GI ENDOSCOPY,DIAGNOSIS N/A 03/03/2019    Procedure: UGI ENDO, INCLUDE ESOPHAGUS, STOMACH, & DUODENUM &/OR JEJUNUM; DX W/WO COLLECTION SPECIMN, BY BRUSH OR WASH;  Surgeon: Luanne Bras, MD;  [facility]: [facility];  Service: Gastroenterology   ??? SALPINGOOPHORECTOMY           Family History    The patient's family history is not on file..      Social History:    Tobacco use: denies  Alcohol use: denies  Drug use: denies      Review of Systems    A 12 system review of systems was negative except as noted in HPI    Objective     PE: Blood pressure 128/57, pulse 80, temperature 35.6 ??C (96.1 ??F), temperature source Tympanic, height 162.6 cm (5' 4), weight 83.9 kg (185 lb). Body mass index is 31.76 kg/m??.  General: alert, oriented  Lungs: clear to auscultation, percussion to the bases, and unlabored breathing  Heart: euvolemic, regular rate and rhythm, normal S1 and S2, no murmur  Abd: soft, non-distended, non-tender, no organomegaly or masses  Ascites: mild   Skin: icteric  Ext: edema  Neuro: non-focal exam. thought organized, appropriate affect, normal fluent speech        Test Results    Labs:  All lab results last 24 hours:  No results found for this or any previous visit (from the past 24 hour(s)).    Imaging:     EXAM: CT ABDOMEN PELVIS W CONTRAST  DATE: 03/06/2019 2:31 AM  ACCESSION: [id number]  DICTATED: 03/06/2019 2:45 AM  INTERPRETATION LOCATION: [facility]  ??  CLINICAL INDICATION: Liver transplant workup    ??  COMPARISON: MRI abdomen 02/21/2019.  ??  TECHNIQUE: A spiral CT scan of the abdomen and pelvis was obtained with IV contrast from the lung bases through the pubic symphysis. Images were reconstructed in the axial plane. Coronal and sagittal reformatted images were also provided for further evaluation.  ??  FINDINGS:   ??  LOWER THORAX: Left basilar opacities, likely atelectasis.  ??  HEPATOBILIARY: Cirrhotic liver morphology with nodular hepatic contour and left hepatic hypertrophy. Gallbladder is absent. No biliary dilatation.    SPLEEN: Massive splenomegaly measuring 19.3 cm craniocaudal, similar to prior.  PANCREAS: Unremarkable.  ??  ADRENALS: Unremarkable.  KIDNEYS/URETERS: Mass effect in the  left kidney by the enlarged spleen. The kidneys enhance symmetrically. No hydronephrosis bilaterally.  ??  BLADDER: Underdistended, otherwise unremarkable.  PELVIC/REPRODUCTIVE ORGANS: Uterus surgically absent. No adnexal masses.  ??  GI TRACT: The stomach is distended with fluid and debris. No dilated or thick walled loops of bowel. No bowel obstruction.  ??  PERITONEUM/RETROPERITONEUM AND MESENTERY: Mild mesenteric edema. No intracranial free air. Trace ascites.  LYMPH NODES: No enlarged lymph nodes.   VESSELS: The abdominal aorta normal in caliber with mild atherosclerotic calcification. Large retroperitoneal varices are again noted. IVC unremarkable. TIPS stent extending from the right portal vein into the right hepatic vein. Likely chronic near occlusive thrombus noted within the left portal vein (3:21) and main portal vein just proximal to the bifurcation. Paraesophageal and gastrohepatic varices again noted.   ??  BONES AND SOFT TISSUES: Diffuse body wall edema. No acute osseous abnormality.  ??  IMPRESSION:  ??  Cirrhosis with sequela of portal hypertension status post TIPS stent placement. No substantial change in massive splenomegaly, large caliber upper abdominal varices and trace ascites.  ??  Similar appearance of chronic near occlusive thrombus in the left portal vein branches and main portal vein just proximal to the bifurcation.

## 2019-03-25 ENCOUNTER — Other Ambulatory Visit (INDEPENDENT_AMBULATORY_CARE_PROVIDER_SITE_OTHER): Payer: Self-pay | Admitting: *Deleted

## 2019-03-25 DIAGNOSIS — K7460 Unspecified cirrhosis of liver: Secondary | ICD-10-CM

## 2019-03-25 DIAGNOSIS — D649 Anemia, unspecified: Secondary | ICD-10-CM

## 2019-03-25 DIAGNOSIS — K31819 Angiodysplasia of stomach and duodenum without bleeding: Secondary | ICD-10-CM

## 2019-03-25 NOTE — Progress Notes (Signed)
.  hem

## 2019-03-26 ENCOUNTER — Other Ambulatory Visit (HOSPITAL_COMMUNITY)
Admission: RE | Admit: 2019-03-26 | Discharge: 2019-03-26 | Disposition: A | Discharge status: Home / Self Care | Payer: PPO | Source: Ambulatory Visit | Attending: Internal Medicine | Admitting: Internal Medicine

## 2019-03-26 ENCOUNTER — Other Ambulatory Visit: Payer: Self-pay

## 2019-03-26 DIAGNOSIS — K31819 Angiodysplasia of stomach and duodenum without bleeding: Secondary | ICD-10-CM | POA: Diagnosis not present

## 2019-03-26 DIAGNOSIS — D649 Anemia, unspecified: Secondary | ICD-10-CM | POA: Insufficient documentation

## 2019-03-26 DIAGNOSIS — K7460 Unspecified cirrhosis of liver: Secondary | ICD-10-CM | POA: Diagnosis not present

## 2019-03-26 LAB — HEMOGLOBIN AND HEMATOCRIT, BLOOD
HCT: 26.3 % — ABNORMAL LOW (ref 36.0–46.0)
Hemoglobin: 7.5 g/dL — ABNORMAL LOW (ref 12.0–15.0)

## 2019-03-26 LAB — SAMPLE TO BLOOD BANK

## 2019-03-27 DIAGNOSIS — E1165 Type 2 diabetes mellitus with hyperglycemia: Secondary | ICD-10-CM | POA: Diagnosis not present

## 2019-03-28 ENCOUNTER — Other Ambulatory Visit (INDEPENDENT_AMBULATORY_CARE_PROVIDER_SITE_OTHER): Payer: Self-pay | Admitting: Internal Medicine

## 2019-03-28 ENCOUNTER — Encounter (INDEPENDENT_AMBULATORY_CARE_PROVIDER_SITE_OTHER): Payer: Self-pay

## 2019-03-28 DIAGNOSIS — D649 Anemia, unspecified: Secondary | ICD-10-CM

## 2019-03-31 ENCOUNTER — Other Ambulatory Visit (HOSPITAL_COMMUNITY)
Admission: RE | Admit: 2019-03-31 | Discharge: 2019-03-31 | Disposition: A | Discharge status: Home / Self Care | Payer: PPO | Source: Ambulatory Visit | Attending: Internal Medicine | Admitting: Internal Medicine

## 2019-03-31 DIAGNOSIS — D649 Anemia, unspecified: Secondary | ICD-10-CM | POA: Insufficient documentation

## 2019-03-31 LAB — SAMPLE TO BLOOD BANK

## 2019-03-31 LAB — HEMOGLOBIN AND HEMATOCRIT, BLOOD
HCT: 22 % — ABNORMAL LOW (ref 36.0–46.0)
Hemoglobin: 6.1 g/dL — CL (ref 12.0–15.0)

## 2019-03-31 LAB — PREPARE RBC (CROSSMATCH)

## 2019-04-01 ENCOUNTER — Other Ambulatory Visit: Payer: Self-pay

## 2019-04-01 ENCOUNTER — Encounter (HOSPITAL_COMMUNITY)
Admission: RE | Admit: 2019-04-01 | Discharge: 2019-04-01 | Disposition: A | Discharge status: Home / Self Care | Payer: PPO | Source: Ambulatory Visit | Attending: Internal Medicine | Admitting: Internal Medicine

## 2019-04-01 DIAGNOSIS — G9341 Metabolic encephalopathy: Secondary | ICD-10-CM | POA: Insufficient documentation

## 2019-04-01 DIAGNOSIS — D508 Other iron deficiency anemias: Secondary | ICD-10-CM | POA: Diagnosis not present

## 2019-04-01 DIAGNOSIS — K7469 Other cirrhosis of liver: Secondary | ICD-10-CM | POA: Diagnosis not present

## 2019-04-01 MED ORDER — SODIUM CHLORIDE 0.9% IV SOLUTION: Freq: Once | INTRAVENOUS | Status: DC

## 2019-04-01 MED ORDER — ACETAMINOPHEN 325 MG PO TABS
650.0000 mg | ORAL_TABLET | Freq: Once | ORAL | Status: AC
  Administered 2019-04-01: 650 mg via ORAL
  Filled 2019-04-01: qty 2

## 2019-04-01 MED ORDER — DIPHENHYDRAMINE HCL 25 MG PO CAPS
25.0000 mg | ORAL_CAPSULE | Freq: Once | ORAL | Status: AC
  Administered 2019-04-01: 25 mg via ORAL
  Filled 2019-04-01: qty 1

## 2019-04-01 MED ORDER — FUROSEMIDE 10 MG/ML IJ SOLN
40.0000 mg | Freq: Once | INTRAMUSCULAR | Status: AC
  Administered 2019-04-01: 40 mg via INTRAVENOUS
  Filled 2019-04-01: qty 4

## 2019-04-01 NOTE — Progress Notes (Signed)
Patient was transfused with 2 units of PRBC today, tolerated well.  One hour post H&H drawn, however lab called after the patient was released that the blood was slightly hemolyzed.

## 2019-04-02 ENCOUNTER — Encounter (INDEPENDENT_AMBULATORY_CARE_PROVIDER_SITE_OTHER): Payer: Self-pay

## 2019-04-02 ENCOUNTER — Ambulatory Visit: Admit: 2019-04-02 | Discharge: 2019-04-03 | Payer: PRIVATE HEALTH INSURANCE | Attending: Family | Primary: Family

## 2019-04-02 ENCOUNTER — Encounter: Admit: 2019-04-02 | Discharge: 2019-04-02 | Payer: PRIVATE HEALTH INSURANCE | Attending: Family | Primary: Family

## 2019-04-02 DIAGNOSIS — Z01818 Encounter for other preprocedural examination: Principal | ICD-10-CM

## 2019-04-02 DIAGNOSIS — Z1159 Encounter for screening for other viral diseases: Principal | ICD-10-CM

## 2019-04-02 LAB — BPAM RBC
Blood Product Expiration Date: 202008062359
Blood Product Expiration Date: 202008062359
ISSUE DATE / TIME: 202007140858
ISSUE DATE / TIME: 202007141112
Unit Type and Rh: 9500
Unit Type and Rh: 9500

## 2019-04-02 LAB — TYPE AND SCREEN
ABO/RH(D): O NEG
Antibody Screen: NEGATIVE
Unit division: 0
Unit division: 0

## 2019-04-02 NOTE — Unmapped (Signed)
Addended by: Meda Klinefelter on: 04/02/2019 10:09 AM     Modules accepted: Orders

## 2019-04-02 NOTE — Unmapped (Signed)
Name:  Marilyn French  DOB: Jan 20, 1956  Today's Date: 04/02/2019  Age:  63 y.o.    Today's Visit Included:   Indication for Testing: Patient is scheduled for an upcoming procedure, surgery or treatment requiring pre-testing for COVID-19.    Informed the patient that their surgeon will be notified of results in 24-48 hours.     Advised patient to self-isolate and remain in his/her home until the surgery to minimize risk of COVID-19 infection prior to surgery.    Also advised patient to notify his/her surgeon/specialist immediately if the patient develops any new symptoms such as:  []  Subjective fever  []  Chills  []  Muscle aches  []  Runny nose  []  Sore throat  []  Loss of taste or smell   []  Cough (new or worsening of chronic cough)  []  Shortness of breath  []  Diarrhea (3 or more loose stools in the last 24 hours)  []  Fatigue (new or worsening)  []  Abdominal pain  []  Nausea or vomiting  []  Headache     Advised all household members and close contacts to remain in the house as much as possible as well.     COVID-19 testing performed.    Meda Klinefelter, CMA

## 2019-04-04 ENCOUNTER — Ambulatory Visit: Admit: 2019-04-04 | Discharge: 2019-04-04 | Payer: PRIVATE HEALTH INSURANCE

## 2019-04-04 ENCOUNTER — Encounter
Admit: 2019-04-04 | Discharge: 2019-04-04 | Payer: PRIVATE HEALTH INSURANCE | Attending: Certified Registered" | Primary: Certified Registered"

## 2019-04-04 DIAGNOSIS — D500 Iron deficiency anemia secondary to blood loss (chronic): Principal | ICD-10-CM

## 2019-04-04 DIAGNOSIS — K922 Gastrointestinal hemorrhage, unspecified: Secondary | ICD-10-CM | POA: Diagnosis not present

## 2019-04-04 DIAGNOSIS — E119 Type 2 diabetes mellitus without complications: Secondary | ICD-10-CM | POA: Diagnosis not present

## 2019-04-04 DIAGNOSIS — K766 Portal hypertension: Secondary | ICD-10-CM | POA: Diagnosis not present

## 2019-04-04 DIAGNOSIS — E7800 Pure hypercholesterolemia, unspecified: Secondary | ICD-10-CM | POA: Diagnosis not present

## 2019-04-04 DIAGNOSIS — F329 Major depressive disorder, single episode, unspecified: Secondary | ICD-10-CM | POA: Diagnosis not present

## 2019-04-04 DIAGNOSIS — M797 Fibromyalgia: Secondary | ICD-10-CM | POA: Diagnosis not present

## 2019-04-04 DIAGNOSIS — Z79899 Other long term (current) drug therapy: Secondary | ICD-10-CM | POA: Diagnosis not present

## 2019-04-04 DIAGNOSIS — K31811 Angiodysplasia of stomach and duodenum with bleeding: Secondary | ICD-10-CM | POA: Diagnosis not present

## 2019-04-04 DIAGNOSIS — E039 Hypothyroidism, unspecified: Secondary | ICD-10-CM | POA: Diagnosis not present

## 2019-04-04 DIAGNOSIS — M810 Age-related osteoporosis without current pathological fracture: Secondary | ICD-10-CM | POA: Diagnosis not present

## 2019-04-04 DIAGNOSIS — K3189 Other diseases of stomach and duodenum: Secondary | ICD-10-CM | POA: Diagnosis not present

## 2019-04-04 MED ORDER — BUSPIRONE 10 MG TABLET
ORAL_TABLET | Freq: Two times a day (BID) | ORAL | 0 refills | 30 days | Status: CP
Start: 2019-04-04 — End: 2019-05-04

## 2019-04-04 MED ORDER — SUCRALFATE 100 MG/ML ORAL SUSPENSION
Freq: Four times a day (QID) | ORAL | 5 refills | 30.00000 days | Status: CP
Start: 2019-04-04 — End: 2019-10-01

## 2019-04-07 ENCOUNTER — Encounter (INDEPENDENT_AMBULATORY_CARE_PROVIDER_SITE_OTHER): Payer: Self-pay | Admitting: Internal Medicine

## 2019-04-07 ENCOUNTER — Other Ambulatory Visit: Payer: Self-pay

## 2019-04-07 ENCOUNTER — Other Ambulatory Visit (HOSPITAL_COMMUNITY)
Admission: RE | Admit: 2019-04-07 | Discharge: 2019-04-07 | Disposition: A | Discharge status: Home / Self Care | Payer: PPO | Source: Ambulatory Visit | Attending: Internal Medicine | Admitting: Internal Medicine

## 2019-04-07 ENCOUNTER — Ambulatory Visit (INDEPENDENT_AMBULATORY_CARE_PROVIDER_SITE_OTHER): Payer: PPO | Admitting: Internal Medicine

## 2019-04-07 VITALS — BP 145/74 | HR 82 | Temp 97.8°F | Resp 18 | Ht 64.0 in | Wt 193.4 lb

## 2019-04-07 DIAGNOSIS — K7290 Hepatic failure, unspecified without coma: Secondary | ICD-10-CM | POA: Diagnosis not present

## 2019-04-07 DIAGNOSIS — K31819 Angiodysplasia of stomach and duodenum without bleeding: Secondary | ICD-10-CM | POA: Insufficient documentation

## 2019-04-07 DIAGNOSIS — K7460 Unspecified cirrhosis of liver: Secondary | ICD-10-CM | POA: Diagnosis not present

## 2019-04-07 DIAGNOSIS — K7682 Hepatic encephalopathy: Secondary | ICD-10-CM

## 2019-04-07 LAB — BASIC METABOLIC PANEL
Anion gap: 6 (ref 5–15)
BUN: 18 mg/dL (ref 8–23)
CO2: 23 mmol/L (ref 22–32)
Calcium: 8.4 mg/dL — ABNORMAL LOW (ref 8.9–10.3)
Chloride: 110 mmol/L (ref 98–111)
Creatinine, Ser: 0.86 mg/dL (ref 0.44–1.00)
GFR calc Af Amer: >60 mL/min (ref >60)
GFR calc non Af Amer: >60 mL/min (ref >60)
Glucose, Bld: 211 mg/dL — ABNORMAL HIGH (ref 70–99)
Potassium: 4 mmol/L (ref 3.5–5.1)
Sodium: 139 mmol/L (ref 135–145)

## 2019-04-07 LAB — BILIRUBIN, TOTAL: Total Bilirubin: 2.8 mg/dL — ABNORMAL HIGH (ref 0.3–1.2)

## 2019-04-07 LAB — SAMPLE TO BLOOD BANK

## 2019-04-07 LAB — HEMOGLOBIN AND HEMATOCRIT, BLOOD
HCT: 25.4 % — ABNORMAL LOW (ref 36.0–46.0)
Hemoglobin: 7.3 g/dL — ABNORMAL LOW (ref 12.0–15.0)

## 2019-04-07 LAB — PROTIME-INR
INR: 1.4 — ABNORMAL HIGH (ref 0.8–1.2)
Prothrombin Time: 17.1 seconds — ABNORMAL HIGH (ref 11.4–15.2)

## 2019-04-07 LAB — AMMONIA: Ammonia: 70 umol/L — ABNORMAL HIGH (ref 9–35)

## 2019-04-07 NOTE — Patient Instructions (Signed)
Physician will call with results of blood test. Notify if he will wait drop to 185 pounds.

## 2019-04-07 NOTE — Progress Notes (Signed)
Presenting complaint;  Follow-up for decompensated liver disease and and anemia secondary GI bleed requiring frequent transfusion.  Database sent subjective:  Patient is 63 year old Caucasian female who has cirrhosis secondary to NAFLD who first experienced esophageal variceal bleed on Thanksgiving day 2011 and then 2 years later she another variceal bleed.  Her varices have been eradicated with banding.  Last year she had TIPS at [facility]. She was admitted to [facility] for hepatic encephalopathy and anemia about 5 weeks ago when she was subsequently transferred to [facility] as she was scheduled for GI procedure.  She has required frequent blood transfusion because of chronic GI bleed due to gastric antral vascular ectasia.  She also has undergone therapy for mesocaval varices at [facility]. Last week she received 2 units of PRBCs at [facility].  She underwent EGD with APC ablation at [facility] on 04/04/2019.  Patient states she is scheduled for repeat procedure in 2 months. Patient is accompanied by her husband Ronalee Belts.  Ronalee Belts tells me that Dr. Ahmed Prima is informed them that she has not been listed for transplant because of low meld score.  Patient feels very frustrated as she feels she is getting worse and losing ground. She denies nausea vomiting abdominal pain melena or rectal bleeding.  She is having 3-4 stools per day.  She feels weak and tired all the time.  She has not had any confusion since her last hospitalization.   Current Medications: Outpatient Encounter Medications as of 04/07/2019  Medication Sig  . busPIRone (BUSPAR) 5 MG tablet Take 10 mg by mouth 2 (two) times daily.   . cholecalciferol (VITAMIN D) 1000 UNITS tablet Take 1,000 Units by mouth 3 (three) times a week.   Marland Kitchen HUMALOG 100 UNIT/ML injection Inject 12-20 Units into the skin 3 (three) times daily with meals.   . Insulin Glargine (LANTUS SOLOSTAR) 100 UNIT/ML Solostar Pen Inject 40 Units into the skin daily at 10 pm.  (Patient taking differently: Inject 55 Units into the skin every morning. )  . Insulin Pen Needle 31G X 5 MM MISC Use with each insulin injection  . lactulose (CHRONULAC) 10 GM/15ML solution Take 45 mLs (30 g total) by mouth 3 (three) times daily.  Marland Kitchen levothyroxine (SYNTHROID, LEVOTHROID) 25 MCG tablet Take 25 mcg by mouth daily before breakfast.   . magnesium oxide (MAG-OX) 400 MG tablet Take 400 mg by mouth daily.   . multivitamin (VIT W/EXTRA C) CHEW chewable tablet Chew 1 tablet by mouth daily.  . OXYGEN Inhale 2 L into the lungs at bedtime.   . pantoprazole (PROTONIX) 40 MG tablet Take 1 tablet (40 mg total) by mouth 2 (two) times daily.  . potassium chloride (K-DUR) 10 MEQ tablet Take 1 tablet (10 mEq total) by mouth 2 (two) times daily.  . rifaximin (XIFAXAN) 550 MG TABS tablet Take 1 tablet (550 mg total) by mouth 2 (two) times daily.  . sertraline (ZOLOFT) 50 MG tablet Take 50 mg by mouth daily.  Marland Kitchen spironolactone (ALDACTONE) 50 MG tablet TAKE 1 TABLET BY MOUTH TWICE DAILY  . sucralfate (CARAFATE) 1 GM/10ML suspension Take 1 g by mouth 4 (four) times daily.   Marland Kitchen torsemide (DEMADEX) 20 MG tablet Take 1 tablet (20 mg total) by mouth daily. Please call for OV [phone]  . trimethoprim-polymyxin b (POLYTRIM) ophthalmic solution Place 1 drop into the left eye See admin instructions. Use 3 to 4 times daily for 2 days following monthly eye injection  . zinc  sulfate 220 (50 Zn) MG capsule Take 1 capsule (220 mg total) by mouth daily.   No facility-administered encounter medications on file as of 04/07/2019.      Objective: Blood pressure (!) 145/74, pulse 82, temperature 97.8 F (36.6 C), temperature source Oral, resp. rate 18, height 5' 4"  (1.626 m), weight 193 lb 6.4 oz (87.7 kg). Patient is alert and in no acute distress. She does not have asterixis. Conjunctiva is pale. Sclera is icteric Oropharyngeal mucosa is normal. No neck masses or thyromegaly noted. Cardiac exam with regular  rhythm normal S1 and S2.  Systolic murmur at left sternal border is unchanged.  It is no more than grade 2/6. Lungs are clear to auscultation. Abdomen is full.  On palpation is soft and nontender with organomegaly or masses. No LE edema or clubbing noted.  Labs/studies Results:  CBC Latest Ref Rng & Units 03/31/2019 03/26/2019 03/18/2019  WBC 4.0 - 10.5 K/uL - - -  Hemoglobin 12.0 - 15.0 g/dL 6.1(LL) 7.5(L) 8.8(L)  Hematocrit 36.0 - 46.0 % 22.0(L) 26.3(L) 29.9(L)  Platelets 150 - 400 K/uL - - -    CMP Latest Ref Rng & Units 03/11/2019 02/27/2019 02/26/2019  Glucose 70 - 99 mg/dL 292(H) 112(H) 132(H)  BUN 8 - 23 mg/dL 15 24(H) 26(H)  Creatinine 0.44 - 1.00 mg/dL 0.92 0.98 0.99  Sodium 135 - 145 mmol/L 141 142 141  Potassium 3.5 - 5.1 mmol/L 3.8 3.6 3.8  Chloride 98 - 111 mmol/L 105 113(H) 113(H)  CO2 22 - 32 mmol/L 27 22 22   Calcium 8.9 - 10.3 mg/dL 8.3(L) 8.2(L) 8.2(L)  Total Protein 6.5 - 8.1 g/dL 5.1(L) - 4.7(L)  Total Bilirubin 0.3 - 1.2 mg/dL 2.6(H) - 3.0(H)  Alkaline Phos 38 - 126 U/L 80 - 74  AST 15 - 41 U/L 34 - 27  ALT 0 - 44 U/L 25 - 24    Hepatic Function Latest Ref Rng & Units 03/11/2019 02/26/2019 02/03/2019  Total Protein 6.5 - 8.1 g/dL 5.1(L) 4.7(L) 5.6(L)  Albumin 3.5 - 5.0 g/dL 3.0(L) 2.3(L) 2.8(L)  AST 15 - 41 U/L 34 27 35  ALT 0 - 44 U/L 25 24 27   Alk Phosphatase 38 - 126 U/L 80 74 93  Total Bilirubin 0.3 - 1.2 mg/dL 2.6(H) 3.0(H) 2.6(H)  Bilirubin, Direct 0.0 - 0.2 mg/dL - - -    No results found for: CRP    Assessment:  #1.  Advanced cirrhosis secondary to NAFLD.  She is being followed at [facility].  She has not been listed for transplant since her meld score has been low.  Last score was 15.  However I feel meld score is underestimating her disease.  #2.  Hepatic encephalopathy.  She was hospitalized week before loss.  She appears to be at her baseline.  #3.  Anemia secondary to chronic GI blood loss from gastric antral vascular ectasia.  She had APC  therapy at [facility] on 04/04/2019.  She is scheduled to undergo repeat session in about 2 months.  #4.  History of fluid overload.  She appears to be close to her baseline.  We need to watch her weight closely and back off on the diuretic therapy as appropriate.   Plan:  Patient will go to the lab for H&H and hold clot. She will also have metabolic 7 INR serum ammonia and bilirubin. Patient advised to call office if she loses more than 5 pounds. Office visit in about 10 weeks.

## 2019-04-08 DIAGNOSIS — R069 Unspecified abnormalities of breathing: Secondary | ICD-10-CM | POA: Diagnosis not present

## 2019-04-10 ENCOUNTER — Encounter (INDEPENDENT_AMBULATORY_CARE_PROVIDER_SITE_OTHER): Payer: PPO | Admitting: Ophthalmology

## 2019-04-10 ENCOUNTER — Other Ambulatory Visit: Payer: Self-pay

## 2019-04-10 DIAGNOSIS — H43813 Vitreous degeneration, bilateral: Secondary | ICD-10-CM

## 2019-04-10 DIAGNOSIS — E11311 Type 2 diabetes mellitus with unspecified diabetic retinopathy with macular edema: Secondary | ICD-10-CM | POA: Diagnosis not present

## 2019-04-10 DIAGNOSIS — H35033 Hypertensive retinopathy, bilateral: Secondary | ICD-10-CM

## 2019-04-10 DIAGNOSIS — E113312 Type 2 diabetes mellitus with moderate nonproliferative diabetic retinopathy with macular edema, left eye: Secondary | ICD-10-CM

## 2019-04-10 DIAGNOSIS — I10 Essential (primary) hypertension: Secondary | ICD-10-CM

## 2019-04-10 DIAGNOSIS — E113291 Type 2 diabetes mellitus with mild nonproliferative diabetic retinopathy without macular edema, right eye: Secondary | ICD-10-CM | POA: Diagnosis not present

## 2019-04-14 ENCOUNTER — Encounter (HOSPITAL_COMMUNITY)
Admission: RE | Admit: 2019-04-14 | Discharge: 2019-04-14 | Disposition: A | Discharge status: Home / Self Care | Payer: PPO | Source: Ambulatory Visit | Attending: Internal Medicine | Admitting: Internal Medicine

## 2019-04-14 ENCOUNTER — Other Ambulatory Visit: Payer: Self-pay

## 2019-04-14 DIAGNOSIS — D508 Other iron deficiency anemias: Secondary | ICD-10-CM | POA: Diagnosis not present

## 2019-04-14 LAB — SAMPLE TO BLOOD BANK

## 2019-04-14 LAB — PREPARE RBC (CROSSMATCH)

## 2019-04-14 LAB — HEMOGLOBIN AND HEMATOCRIT, BLOOD
HCT: 23.7 % — ABNORMAL LOW (ref 36.0–46.0)
Hemoglobin: 6.5 g/dL — CL (ref 12.0–15.0)

## 2019-04-15 ENCOUNTER — Other Ambulatory Visit: Payer: Self-pay

## 2019-04-15 ENCOUNTER — Encounter (HOSPITAL_COMMUNITY)
Admission: RE | Admit: 2019-04-15 | Discharge: 2019-04-15 | Disposition: A | Discharge status: Home / Self Care | Payer: PPO | Source: Ambulatory Visit | Attending: Internal Medicine | Admitting: Internal Medicine

## 2019-04-15 ENCOUNTER — Encounter (HOSPITAL_COMMUNITY): Payer: Self-pay

## 2019-04-15 DIAGNOSIS — D508 Other iron deficiency anemias: Secondary | ICD-10-CM | POA: Diagnosis not present

## 2019-04-15 MED ORDER — FUROSEMIDE 10 MG/ML IJ SOLN
40.0000 mg | Freq: Once | INTRAMUSCULAR | Status: AC
  Administered 2019-04-15: 40 mg via INTRAVENOUS
  Filled 2019-04-15: qty 4

## 2019-04-15 MED ORDER — SODIUM CHLORIDE 0.9% IV SOLUTION
Freq: Once | INTRAVENOUS | Status: AC
  Administered 2019-04-15: 09:00:00 via INTRAVENOUS

## 2019-04-15 MED ORDER — ACETAMINOPHEN 325 MG PO TABS
650.0000 mg | ORAL_TABLET | Freq: Once | ORAL | Status: AC
  Administered 2019-04-15: 650 mg via ORAL
  Filled 2019-04-15: qty 2

## 2019-04-15 MED ORDER — DIPHENHYDRAMINE HCL 25 MG PO CAPS
25.0000 mg | ORAL_CAPSULE | Freq: Once | ORAL | Status: AC
  Administered 2019-04-15: 25 mg via ORAL
  Filled 2019-04-15: qty 1

## 2019-04-16 LAB — BPAM RBC
Blood Product Expiration Date: 202008062359
Blood Product Expiration Date: 202008072359
ISSUE DATE / TIME: 202007280926
ISSUE DATE / TIME: 202007281158
Unit Type and Rh: 9500
Unit Type and Rh: 9500

## 2019-04-16 LAB — TYPE AND SCREEN
ABO/RH(D): O NEG
Antibody Screen: NEGATIVE
Unit division: 0
Unit division: 0

## 2019-04-21 ENCOUNTER — Other Ambulatory Visit (INDEPENDENT_AMBULATORY_CARE_PROVIDER_SITE_OTHER): Payer: Self-pay | Admitting: *Deleted

## 2019-04-21 DIAGNOSIS — D508 Other iron deficiency anemias: Secondary | ICD-10-CM

## 2019-04-22 ENCOUNTER — Other Ambulatory Visit (HOSPITAL_COMMUNITY)
Admission: RE | Admit: 2019-04-22 | Discharge: 2019-04-22 | Disposition: A | Discharge status: Home / Self Care | Payer: PPO | Source: Ambulatory Visit | Attending: Internal Medicine | Admitting: Internal Medicine

## 2019-04-22 DIAGNOSIS — D508 Other iron deficiency anemias: Secondary | ICD-10-CM | POA: Diagnosis not present

## 2019-04-22 LAB — SAMPLE TO BLOOD BANK

## 2019-04-22 LAB — HEMOGLOBIN AND HEMATOCRIT, BLOOD
HCT: 27.3 % — ABNORMAL LOW (ref 36.0–46.0)
Hemoglobin: 7.9 g/dL — ABNORMAL LOW (ref 12.0–15.0)

## 2019-04-23 ENCOUNTER — Other Ambulatory Visit (INDEPENDENT_AMBULATORY_CARE_PROVIDER_SITE_OTHER): Payer: Self-pay | Admitting: *Deleted

## 2019-04-23 DIAGNOSIS — D649 Anemia, unspecified: Secondary | ICD-10-CM

## 2019-04-29 ENCOUNTER — Encounter (HOSPITAL_COMMUNITY)
Admission: RE | Admit: 2019-04-29 | Discharge: 2019-04-29 | Disposition: A | Discharge status: Home / Self Care | Payer: PPO | Source: Ambulatory Visit | Attending: Internal Medicine | Admitting: Internal Medicine

## 2019-04-29 DIAGNOSIS — K31819 Angiodysplasia of stomach and duodenum without bleeding: Secondary | ICD-10-CM | POA: Diagnosis not present

## 2019-04-29 DIAGNOSIS — I8500 Esophageal varices without bleeding: Secondary | ICD-10-CM | POA: Diagnosis not present

## 2019-04-29 DIAGNOSIS — K7460 Unspecified cirrhosis of liver: Secondary | ICD-10-CM | POA: Diagnosis not present

## 2019-04-29 DIAGNOSIS — K760 Fatty (change of) liver, not elsewhere classified: Secondary | ICD-10-CM | POA: Diagnosis not present

## 2019-04-29 DIAGNOSIS — K922 Gastrointestinal hemorrhage, unspecified: Secondary | ICD-10-CM | POA: Diagnosis not present

## 2019-04-29 LAB — CBC
HCT: 22.7 % — ABNORMAL LOW (ref 36.0–46.0)
Hemoglobin: 6.3 g/dL — CL (ref 12.0–15.0)
MCH: 25.4 pg — ABNORMAL LOW (ref 26.0–34.0)
MCHC: 27.8 g/dL — ABNORMAL LOW (ref 30.0–36.0)
MCV: 91.5 fL (ref 80.0–100.0)
Platelets: 49 10*3/uL — ABNORMAL LOW (ref 150–400)
RBC: 2.48 MIL/uL — ABNORMAL LOW (ref 3.87–5.11)
RDW: 18.6 % — ABNORMAL HIGH (ref 11.5–15.5)
WBC: 3.9 10*3/uL — ABNORMAL LOW (ref 4.0–10.5)
nRBC: 0 % (ref 0.0–0.2)

## 2019-04-29 LAB — SAMPLE TO BLOOD BANK

## 2019-04-30 LAB — PREPARE RBC (CROSSMATCH)

## 2019-05-01 ENCOUNTER — Encounter (HOSPITAL_COMMUNITY): Payer: Self-pay

## 2019-05-01 ENCOUNTER — Other Ambulatory Visit: Payer: Self-pay

## 2019-05-01 ENCOUNTER — Encounter (HOSPITAL_COMMUNITY)
Admission: RE | Admit: 2019-05-01 | Discharge: 2019-05-01 | Disposition: A | Discharge status: Home / Self Care | Payer: PPO | Source: Ambulatory Visit | Attending: Internal Medicine | Admitting: Internal Medicine

## 2019-05-01 DIAGNOSIS — K7460 Unspecified cirrhosis of liver: Secondary | ICD-10-CM | POA: Insufficient documentation

## 2019-05-01 DIAGNOSIS — D509 Iron deficiency anemia, unspecified: Secondary | ICD-10-CM | POA: Insufficient documentation

## 2019-05-01 LAB — HEMOGLOBIN AND HEMATOCRIT, BLOOD
HCT: 25.6 % — ABNORMAL LOW (ref 36.0–46.0)
Hemoglobin: 7.5 g/dL — ABNORMAL LOW (ref 12.0–15.0)

## 2019-05-01 MED ORDER — DIPHENHYDRAMINE HCL 25 MG PO CAPS
25.0000 mg | ORAL_CAPSULE | Freq: Once | ORAL | Status: AC
  Administered 2019-05-01: 25 mg via ORAL

## 2019-05-01 MED ORDER — ACETAMINOPHEN 325 MG PO TABS
ORAL_TABLET | ORAL | Status: AC
  Filled 2019-05-01: qty 2

## 2019-05-01 MED ORDER — ACETAMINOPHEN 325 MG PO TABS
650.0000 mg | ORAL_TABLET | Freq: Once | ORAL | Status: AC
  Administered 2019-05-01: 650 mg via ORAL

## 2019-05-01 MED ORDER — FUROSEMIDE 10 MG/ML IJ SOLN
40.0000 mg | Freq: Once | INTRAMUSCULAR | Status: AC
  Administered 2019-05-01: 40 mg via INTRAVENOUS

## 2019-05-01 MED ORDER — FUROSEMIDE 10 MG/ML IJ SOLN
INTRAMUSCULAR | Status: AC
  Filled 2019-05-01: qty 4

## 2019-05-01 MED ORDER — DIPHENHYDRAMINE HCL 25 MG PO CAPS
ORAL_CAPSULE | ORAL | Status: AC
  Filled 2019-05-01: qty 1

## 2019-05-01 MED ORDER — SODIUM CHLORIDE 0.9% IV SOLUTION
Freq: Once | INTRAVENOUS | Status: AC
  Administered 2019-05-01: 250 mL via INTRAVENOUS

## 2019-05-01 NOTE — Progress Notes (Signed)
Results for Maria [name], Maria [name] (MRN [id number]) as of 05/01/2019 15:40  Ref. Range 05/01/2019 15:30  Hemoglobin Latest Ref Range: 12.0 - 15.0 g/dL 7.5 (L)  HCT Latest Ref Range: 36.0 - 46.0 % 25.6 (L)   Results for post transfusion H & H

## 2019-05-02 LAB — BPAM RBC
Blood Product Expiration Date: 202009052359
Blood Product Expiration Date: 202009102359
ISSUE DATE / TIME: 202008130907
ISSUE DATE / TIME: 202008131119
Unit Type and Rh: 9500
Unit Type and Rh: 9500

## 2019-05-02 LAB — TYPE AND SCREEN
ABO/RH(D): O NEG
Antibody Screen: NEGATIVE
Unit division: 0
Unit division: 0

## 2019-05-05 ENCOUNTER — Other Ambulatory Visit (INDEPENDENT_AMBULATORY_CARE_PROVIDER_SITE_OTHER): Payer: Self-pay | Admitting: *Deleted

## 2019-05-05 ENCOUNTER — Encounter (INDEPENDENT_AMBULATORY_CARE_PROVIDER_SITE_OTHER): Payer: Self-pay

## 2019-05-05 DIAGNOSIS — D649 Anemia, unspecified: Secondary | ICD-10-CM

## 2019-05-05 DIAGNOSIS — K7460 Unspecified cirrhosis of liver: Secondary | ICD-10-CM

## 2019-05-05 DIAGNOSIS — K31819 Angiodysplasia of stomach and duodenum without bleeding: Secondary | ICD-10-CM

## 2019-05-06 ENCOUNTER — Encounter (INDEPENDENT_AMBULATORY_CARE_PROVIDER_SITE_OTHER): Payer: PPO | Admitting: Ophthalmology

## 2019-05-06 ENCOUNTER — Other Ambulatory Visit: Payer: Self-pay

## 2019-05-06 DIAGNOSIS — H43813 Vitreous degeneration, bilateral: Secondary | ICD-10-CM

## 2019-05-06 DIAGNOSIS — E113312 Type 2 diabetes mellitus with moderate nonproliferative diabetic retinopathy with macular edema, left eye: Secondary | ICD-10-CM

## 2019-05-06 DIAGNOSIS — E113391 Type 2 diabetes mellitus with moderate nonproliferative diabetic retinopathy without macular edema, right eye: Secondary | ICD-10-CM

## 2019-05-06 DIAGNOSIS — I10 Essential (primary) hypertension: Secondary | ICD-10-CM

## 2019-05-06 DIAGNOSIS — E11311 Type 2 diabetes mellitus with unspecified diabetic retinopathy with macular edema: Secondary | ICD-10-CM | POA: Diagnosis not present

## 2019-05-06 DIAGNOSIS — H35033 Hypertensive retinopathy, bilateral: Secondary | ICD-10-CM | POA: Diagnosis not present

## 2019-05-06 DIAGNOSIS — H2513 Age-related nuclear cataract, bilateral: Secondary | ICD-10-CM

## 2019-05-07 ENCOUNTER — Other Ambulatory Visit (HOSPITAL_COMMUNITY)
Admission: RE | Admit: 2019-05-07 | Discharge: 2019-05-07 | Disposition: A | Discharge status: Home / Self Care | Payer: PPO | Source: Ambulatory Visit | Attending: Internal Medicine | Admitting: Internal Medicine

## 2019-05-07 ENCOUNTER — Encounter (INDEPENDENT_AMBULATORY_CARE_PROVIDER_SITE_OTHER): Payer: Self-pay | Admitting: *Deleted

## 2019-05-07 DIAGNOSIS — K7460 Unspecified cirrhosis of liver: Secondary | ICD-10-CM | POA: Diagnosis not present

## 2019-05-07 DIAGNOSIS — D509 Iron deficiency anemia, unspecified: Secondary | ICD-10-CM | POA: Diagnosis not present

## 2019-05-07 LAB — SAMPLE TO BLOOD BANK

## 2019-05-07 LAB — HEMOGLOBIN AND HEMATOCRIT, BLOOD
HCT: 23.5 % — ABNORMAL LOW (ref 36.0–46.0)
Hemoglobin: 6.7 g/dL — CL (ref 12.0–15.0)

## 2019-05-08 LAB — PREPARE RBC (CROSSMATCH)

## 2019-05-09 ENCOUNTER — Other Ambulatory Visit: Payer: Self-pay

## 2019-05-09 ENCOUNTER — Encounter (HOSPITAL_COMMUNITY)
Admission: RE | Admit: 2019-05-09 | Discharge: 2019-05-09 | Disposition: A | Discharge status: Home / Self Care | Payer: PPO | Source: Ambulatory Visit | Attending: Internal Medicine | Admitting: Internal Medicine

## 2019-05-09 ENCOUNTER — Encounter (HOSPITAL_COMMUNITY): Payer: Self-pay

## 2019-05-09 DIAGNOSIS — R069 Unspecified abnormalities of breathing: Secondary | ICD-10-CM | POA: Diagnosis not present

## 2019-05-09 DIAGNOSIS — D509 Iron deficiency anemia, unspecified: Secondary | ICD-10-CM | POA: Diagnosis not present

## 2019-05-09 MED ORDER — ACETAMINOPHEN 325 MG PO TABS
650.0000 mg | ORAL_TABLET | Freq: Once | ORAL | Status: AC
  Administered 2019-05-09: 650 mg via ORAL

## 2019-05-09 MED ORDER — SODIUM CHLORIDE 0.9% IV SOLUTION
Freq: Once | INTRAVENOUS | Status: AC
  Administered 2019-05-09: 250 mL via INTRAVENOUS

## 2019-05-09 MED ORDER — DIPHENHYDRAMINE HCL 25 MG PO CAPS
25.0000 mg | ORAL_CAPSULE | Freq: Once | ORAL | Status: AC
  Administered 2019-05-09: 25 mg via ORAL

## 2019-05-09 MED ORDER — FUROSEMIDE 10 MG/ML IJ SOLN
40.0000 mg | Freq: Once | INTRAMUSCULAR | Status: AC
  Administered 2019-05-09: 11:00:00 40 mg via INTRAVENOUS
  Filled 2019-05-09: qty 4

## 2019-05-10 LAB — TYPE AND SCREEN
ABO/RH(D): O NEG
Antibody Screen: NEGATIVE
Unit division: 0
Unit division: 0

## 2019-05-10 LAB — BPAM RBC
Blood Product Expiration Date: 202009162359
Blood Product Expiration Date: 202009162359
ISSUE DATE / TIME: 202008210930
ISSUE DATE / TIME: 202008211110
Unit Type and Rh: 9500
Unit Type and Rh: 9500

## 2019-05-13 ENCOUNTER — Other Ambulatory Visit (INDEPENDENT_AMBULATORY_CARE_PROVIDER_SITE_OTHER): Payer: Self-pay | Admitting: *Deleted

## 2019-05-13 ENCOUNTER — Encounter (INDEPENDENT_AMBULATORY_CARE_PROVIDER_SITE_OTHER): Payer: Self-pay | Admitting: *Deleted

## 2019-05-13 ENCOUNTER — Encounter (INDEPENDENT_AMBULATORY_CARE_PROVIDER_SITE_OTHER): Payer: Self-pay

## 2019-05-13 DIAGNOSIS — D500 Iron deficiency anemia secondary to blood loss (chronic): Secondary | ICD-10-CM

## 2019-05-13 DIAGNOSIS — K7460 Unspecified cirrhosis of liver: Secondary | ICD-10-CM

## 2019-05-13 DIAGNOSIS — R600 Localized edema: Secondary | ICD-10-CM

## 2019-05-13 DIAGNOSIS — K7682 Hepatic encephalopathy: Secondary | ICD-10-CM

## 2019-05-13 DIAGNOSIS — K7290 Hepatic failure, unspecified without coma: Secondary | ICD-10-CM

## 2019-05-13 DIAGNOSIS — K31819 Angiodysplasia of stomach and duodenum without bleeding: Secondary | ICD-10-CM

## 2019-05-13 DIAGNOSIS — K760 Fatty (change of) liver, not elsewhere classified: Secondary | ICD-10-CM

## 2019-05-13 NOTE — Progress Notes (Signed)
H

## 2019-05-15 ENCOUNTER — Ambulatory Visit (INDEPENDENT_AMBULATORY_CARE_PROVIDER_SITE_OTHER): Payer: PPO | Admitting: General Surgery

## 2019-05-15 ENCOUNTER — Encounter: Payer: Self-pay | Admitting: General Surgery

## 2019-05-15 ENCOUNTER — Other Ambulatory Visit: Payer: Self-pay

## 2019-05-15 ENCOUNTER — Other Ambulatory Visit (HOSPITAL_COMMUNITY)
Admission: RE | Admit: 2019-05-15 | Discharge: 2019-05-15 | Disposition: A | Discharge status: Home / Self Care | Payer: PPO | Source: Ambulatory Visit | Attending: Internal Medicine | Admitting: Internal Medicine

## 2019-05-15 ENCOUNTER — Telehealth (INDEPENDENT_AMBULATORY_CARE_PROVIDER_SITE_OTHER): Payer: Self-pay | Admitting: *Deleted

## 2019-05-15 VITALS — BP 140/68 | HR 85 | Temp 97.5°F | Resp 18 | Ht 64.0 in | Wt 214.0 lb

## 2019-05-15 DIAGNOSIS — D500 Iron deficiency anemia secondary to blood loss (chronic): Secondary | ICD-10-CM | POA: Diagnosis not present

## 2019-05-15 DIAGNOSIS — K7460 Unspecified cirrhosis of liver: Secondary | ICD-10-CM | POA: Diagnosis not present

## 2019-05-15 DIAGNOSIS — Z87898 Personal history of other specified conditions: Secondary | ICD-10-CM | POA: Diagnosis not present

## 2019-05-15 DIAGNOSIS — I8500 Esophageal varices without bleeding: Secondary | ICD-10-CM | POA: Insufficient documentation

## 2019-05-15 DIAGNOSIS — D649 Anemia, unspecified: Secondary | ICD-10-CM | POA: Insufficient documentation

## 2019-05-15 DIAGNOSIS — K31819 Angiodysplasia of stomach and duodenum without bleeding: Secondary | ICD-10-CM

## 2019-05-15 DIAGNOSIS — K7290 Hepatic failure, unspecified without coma: Secondary | ICD-10-CM | POA: Diagnosis present

## 2019-05-15 DIAGNOSIS — K760 Fatty (change of) liver, not elsewhere classified: Secondary | ICD-10-CM | POA: Diagnosis not present

## 2019-05-15 DIAGNOSIS — R600 Localized edema: Secondary | ICD-10-CM | POA: Diagnosis present

## 2019-05-15 LAB — COMPREHENSIVE METABOLIC PANEL
ALT: 29 U/L (ref 0–44)
AST: 34 U/L (ref 15–41)
Albumin: 2.3 g/dL — ABNORMAL LOW (ref 3.5–5.0)
Alkaline Phosphatase: 88 U/L (ref 38–126)
Anion gap: 7 (ref 5–15)
BUN: 21 mg/dL (ref 8–23)
CO2: 21 mmol/L — ABNORMAL LOW (ref 22–32)
Calcium: 7.6 mg/dL — ABNORMAL LOW (ref 8.9–10.3)
Chloride: 111 mmol/L (ref 98–111)
Creatinine, Ser: 1.02 mg/dL — ABNORMAL HIGH (ref 0.44–1.00)
GFR calc Af Amer: >60 mL/min (ref >60)
GFR calc non Af Amer: 58 mL/min — ABNORMAL LOW (ref >60)
Glucose, Bld: 197 mg/dL — ABNORMAL HIGH (ref 70–99)
Potassium: 4.2 mmol/L (ref 3.5–5.1)
Sodium: 139 mmol/L (ref 135–145)
Total Bilirubin: 2.9 mg/dL — ABNORMAL HIGH (ref 0.3–1.2)
Total Protein: 4.8 g/dL — ABNORMAL LOW (ref 6.5–8.1)

## 2019-05-15 LAB — PROTIME-INR
INR: 1.6 — ABNORMAL HIGH (ref 0.8–1.2)
Prothrombin Time: 18.6 seconds — ABNORMAL HIGH (ref 11.4–15.2)

## 2019-05-15 LAB — HEMOGLOBIN AND HEMATOCRIT, BLOOD
HCT: 23.7 % — ABNORMAL LOW (ref 36.0–46.0)
Hemoglobin: 6.8 g/dL — CL (ref 12.0–15.0)

## 2019-05-15 LAB — TSH: TSH: 3.675 u[IU]/mL (ref 0.350–4.500)

## 2019-05-15 NOTE — Telephone Encounter (Signed)
Patient called to make aware result of H&H this morning and that a order had been placed for T&S 2 units of PRBC.  Patient shared that she had done what Dr.Rehman had ask her to do with the fluid intake and the doubling of her fluid medications. Her weight at home was 216 lbs, and @ Dr. Arnoldo Morale office it was 214 lbs.  She took her Lactulose last night and she had 2 moderate BM's , she noted bright red blood in stool then when she wiped she saw bright red blood. She has had 2 more small Bm's and saw then same thing.  Saje says that most of the time her stool is black or a brown but not what she has just had.  Addressed with Dr.Rehman. If the bleeding worsens or shortness of breathe worsens patient is to go to the ED.  Patient is to take Torsemide 20 mg in the morning and 20 mg in the pm. Patient to start Zaroxolyn 2.5 mg by mouth for 3 days. She will take the Zaroxolyn after her morning dose of the Torsemide.  Rx was called to the [facility] in [place]. Patient was called and advised.

## 2019-05-15 NOTE — Progress Notes (Signed)
Maria [name]; [id number]; 1955-10-16   HPI Patient is a 63 year old white female with multiple medical problems who was referred to my care by Dr. Laural Golden for Port-A-Cath placement.  Patient requires frequent blood draws and blood transfusions due to chronic anemia due to blood loss.  She also suffers from thrombocytopenia.  She currently has a pain level of 2 out of 10 which is diffuse in nature.  She is undergoing multiple procedures, including a GI procedure at [facility] in 2 weeks.  Her blood transfusions are almost weekly in nature. Past Medical History:  Diagnosis Date  . Anemia   . CHF (congestive heart failure) ([place])   . Cirrhosis ([place])   . Depression   . Diabetes mellitus   . Esophageal bleed, non-variceal   . Eye hemorrhage    Behind left eye  . Fibromyalgia   . Hypercholesteremia   . Hypertension   . Hypothyroidism   . NAFLD (nonalcoholic fatty liver disease)   . Osteoarthrosis   . Osteoporosis   . PONV (postoperative nausea and vomiting)   . UTI (urinary tract infection) 11/12 end of the month   Patient feels that she passed a kidney stone at that time    Past Surgical History:  Procedure Laterality Date  . APPENDECTOMY  1980  . BILATERAL SALPINGOOPHORECTOMY    . CHOLECYSTECTOMY    . COLONOSCOPY  03/15/2011  . COLONOSCOPY N/A 03/24/2016   Procedure: COLONOSCOPY;  Surgeon: [name], MD;  [facility];  Service: Endoscopy;  Laterality: N/A;  855  . ESOPHAGEAL BANDING  04/24/2012   Procedure: ESOPHAGEAL BANDING;  Surgeon: [name], MD;  [facility];  Service: Endoscopy;  Laterality: N/A;  . Esophageal BANDING  08/03/2012   [facility] in [place] , [place]  09/24/2012   Procedure: ESOPHAGEAL BANDING;  Surgeon: [name], MD;  Location: [facility];  Service: Endoscopy;  Laterality: N/A;  Banding x 3  . ESOPHAGEAL BANDING N/A 01/29/2013   Procedure: ESOPHAGEAL BANDING;  Surgeon: [name],  MD;  [facility];  Service: Endoscopy;  Laterality: N/A;  . ESOPHAGEAL BANDING N/A 06/19/2014   Procedure: ESOPHAGEAL BANDING;  Surgeon: [name], MD;  [facility];  Service: Endoscopy;  Laterality: N/A;  . ESOPHAGEAL BANDING N/A 01/05/2016   Procedure: ESOPHAGEAL BANDING;  Surgeon: [name], MD;  [facility];  Service: Endoscopy;  Laterality: N/A;  . ESOPHAGEAL BANDING N/A 08/03/2016   Procedure: ESOPHAGEAL BANDING;  Surgeon: [name], MD;  [facility];  Service: Endoscopy;  Laterality: N/A;  . ESOPHAGEAL BANDING N/A 05/02/2017   Procedure: ESOPHAGEAL BANDING;  Surgeon: [name], MD;  [facility];  Service: Endoscopy;  Laterality: N/A;  . ESOPHAGOGASTRODUODENOSCOPY  04/24/2012   Procedure: ESOPHAGOGASTRODUODENOSCOPY (EGD);  Surgeon: [name], MD;  [facility];  Service: Endoscopy;  Laterality: N/A;  300  . ESOPHAGOGASTRODUODENOSCOPY N/A 01/29/2013   Procedure: ESOPHAGOGASTRODUODENOSCOPY (EGD);  Surgeon: [name], MD;  [facility];  Service: Endoscopy;  Laterality: N/A;  1200  . ESOPHAGOGASTRODUODENOSCOPY N/A 05/22/2013   Procedure: ESOPHAGOGASTRODUODENOSCOPY (EGD);  Surgeon: [name], MD;  [facility];  Service: Endoscopy;  Laterality: N/A;  1:55  . ESOPHAGOGASTRODUODENOSCOPY N/A 12/31/2013   Procedure: ESOPHAGOGASTRODUODENOSCOPY (EGD);  Surgeon: [name], MD;  [facility];  Service: Endoscopy;  Laterality: N/A;  1200  .  ESOPHAGOGASTRODUODENOSCOPY N/A 06/19/2014   Procedure: ESOPHAGOGASTRODUODENOSCOPY (EGD);  Surgeon: [name], MD;  [facility];  Service: Endoscopy;  Laterality: N/A;  1055  . ESOPHAGOGASTRODUODENOSCOPY N/A 01/15/2015   Procedure: ESOPHAGOGASTRODUODENOSCOPY (EGD);  Surgeon: [name], MD;  [facility];  Service: Endoscopy;  Laterality: N/A;  730 - moved to 9:45 - moved to 1250-Ann notified pt   . ESOPHAGOGASTRODUODENOSCOPY N/A 06/30/2015   Procedure: ESOPHAGOGASTRODUODENOSCOPY (EGD);  Surgeon: [name], MD;  [facility];  Service: Endoscopy;  Laterality: N/A;  1200  . ESOPHAGOGASTRODUODENOSCOPY N/A 01/05/2016   Procedure: ESOPHAGOGASTRODUODENOSCOPY (EGD);  Surgeon: [name], MD;  [facility];  Service: Endoscopy;  Laterality: N/A;  830  . ESOPHAGOGASTRODUODENOSCOPY N/A 08/03/2016   Procedure: ESOPHAGOGASTRODUODENOSCOPY (EGD);  Surgeon: [name], MD;  [facility];  Service: Endoscopy;  Laterality: N/A;  930  . ESOPHAGOGASTRODUODENOSCOPY N/A 05/02/2017   Procedure: ESOPHAGOGASTRODUODENOSCOPY (EGD);  Surgeon: [name], MD;  [facility];  Service: Endoscopy;  Laterality: N/A;  12:00  . ESOPHAGOGASTRODUODENOSCOPY N/A 08/17/2017   Procedure: ESOPHAGOGASTRODUODENOSCOPY (EGD);  Surgeon: [name], MD;  [facility];  Service: Endoscopy;  Laterality: N/A;  8:15  . ESOPHAGOGASTRODUODENOSCOPY N/A 09/12/2017   Procedure: ESOPHAGOGASTRODUODENOSCOPY (EGD);  Surgeon: [name], MD;  [facility];  Service: Endoscopy;  Laterality: N/A;  1040  . ESOPHAGOGASTRODUODENOSCOPY N/A 10/10/2017   Procedure: ESOPHAGOGASTRODUODENOSCOPY (EGD);  Surgeon: [name], MD;  [facility];  Service: Endoscopy;  Laterality: N/A;  225  . ESOPHAGOGASTRODUODENOSCOPY N/A 11/09/2017   Procedure: ESOPHAGOGASTRODUODENOSCOPY (EGD);  Surgeon: [name], MD;  [facility];  Service: Endoscopy;  Laterality: N/A;  . ESOPHAGOGASTRODUODENOSCOPY (EGD) WITH PROPOFOL  09/24/2012   Procedure: ESOPHAGOGASTRODUODENOSCOPY (EGD) WITH PROPOFOL;  Surgeon: [name], MD;  [facility]: [facility];  Service: Endoscopy;  Laterality: N/A;  GE junction at 36  . ESOPHAGOGASTRODUODENOSCOPY (EGD) WITH PROPOFOL N/A 07/06/2017   Procedure: ESOPHAGOGASTRODUODENOSCOPY (EGD) WITH PROPOFOL;  Surgeon: [name],  MD;  [facility];  Service: Endoscopy;  Laterality: N/A;  . ESOPHAGOGASTRODUODENOSCOPY W/ BANDING  08/2010  . GIVENS CAPSULE STUDY N/A 07/05/2017   Procedure: GIVENS CAPSULE STUDY;  Surgeon: [name], MD;  [facility];  Service: Endoscopy;  Laterality: N/A;  . HOT HEMOSTASIS N/A 08/17/2017   Procedure: HOT HEMOSTASIS (ARGON PLASMA COAGULATION/BICAP);  Surgeon: [name], MD;  [facility];  Service: Endoscopy;  Laterality: N/A;  . HOT HEMOSTASIS  09/12/2017   Procedure: HOT HEMOSTASIS (ARGON PLASMA COAGULATION/BICAP);  Surgeon: [name], MD;  [facility];  Service: Endoscopy;;  gastric  . HOT HEMOSTASIS  10/10/2017   Procedure: HOT HEMOSTASIS (ARGON PLASMA COAGULATION/BICAP);  Surgeon: [name], MD;  [facility];  Service: Endoscopy;;  . POLYPECTOMY  03/24/2016   Procedure: POLYPECTOMY;  Surgeon: [name], MD;  [facility];  Service: Endoscopy;;  sigmoid polyp  . RIGHT HEART CATH N/A 04/16/2017   Procedure: Right Heart Cath;  Surgeon: [name], MD;  Location: [facility];  Service: Cardiovascular;  Laterality: N/A;  . RIGHT HEART CATH N/A 07/12/2017   Procedure: RIGHT HEART CATH;  Surgeon: [name], MD;  Location: [facility];  Service: Cardiovascular;  Laterality: N/A;  . TONSILLECTOMY    . UPPER GASTROINTESTINAL ENDOSCOPY  03/15/2011   EGD ED BANDING/TCS  . UPPER  GASTROINTESTINAL ENDOSCOPY  09/05/2010  . UPPER GASTROINTESTINAL ENDOSCOPY  08/11/2010  . VAGINAL HYSTERECTOMY      Family History  Problem Relation Age of Onset  . Lung cancer Mother   . Diabetes Father   . Diabetes Sister   . Hypertension Sister   . Hypothyroidism Sister   . Colon cancer Brother   . Diabetes Sister   . Hypothyroidism Brother   . Healthy Daughter   . Obesity Daughter   . Hypertension Daughter     Current Outpatient Medications on File Prior to Visit  Medication Sig  Dispense Refill  . busPIRone (BUSPAR) 5 MG tablet Take 10 mg by mouth 2 (two) times daily.     . cholecalciferol (VITAMIN D) 1000 UNITS tablet Take 1,000 Units by mouth 3 (three) times a week.     Marland Kitchen HUMALOG 100 UNIT/ML injection Inject 12-20 Units into the skin 3 (three) times daily with meals.   6  . Insulin Glargine (LANTUS SOLOSTAR) 100 UNIT/ML Solostar Pen Inject 40 Units into the skin daily at 10 pm. (Patient taking differently: Inject 55 Units into the skin every morning. )    . Insulin Glargine (LANTUS SOLOSTAR) 100 UNIT/ML Solostar Pen Inject into the skin.    . Insulin Pen Needle 31G X 5 MM MISC Use with each insulin injection 100 each 12  . lactulose (CHRONULAC) 10 GM/15ML solution Take 45 mLs (30 g total) by mouth 3 (three) times daily.    Marland Kitchen levothyroxine (SYNTHROID, LEVOTHROID) 25 MCG tablet Take 25 mcg by mouth daily before breakfast.     . magnesium oxide (MAG-OX) 400 MG tablet Take 400 mg by mouth daily.     . multivitamin (VIT W/EXTRA C) CHEW chewable tablet Chew 1 tablet by mouth daily.    . OXYGEN Inhale 2 L into the lungs at bedtime.     . pantoprazole (PROTONIX) 40 MG tablet Take 1 tablet (40 mg total) by mouth 2 (two) times daily. 180 tablet 3  . potassium chloride (K-DUR) 10 MEQ tablet Take 1 tablet (10 mEq total) by mouth 2 (two) times daily. 180 tablet 3  . rifaximin (XIFAXAN) 550 MG TABS tablet Take 1 tablet (550 mg total) by mouth 2 (two) times daily.    . sertraline (ZOLOFT) 50 MG tablet Take 50 mg by mouth daily.    . sertraline (ZOLOFT) 50 MG tablet Take by mouth.    . spironolactone (ALDACTONE) 50 MG tablet TAKE 1 TABLET BY MOUTH TWICE DAILY 60 tablet 2  . sucralfate (CARAFATE) 1 GM/10ML suspension Take 1 g by mouth 4 (four) times daily.     Marland Kitchen torsemide (DEMADEX) 20 MG tablet Take 1 tablet (20 mg total) by mouth daily. Please call for OV [phone] 30 tablet 2  . trimethoprim-polymyxin b (POLYTRIM) ophthalmic solution Place 1 drop into the left eye See admin  instructions. Use 3 to 4 times daily for 2 days following monthly eye injection  12  . zinc sulfate 220 (50 Zn) MG capsule Take 1 capsule (220 mg total) by mouth daily. 30 capsule 1  . RELION INSULIN SYRINGE 31G X 15/64" 0.5 ML MISC USE 1 SYRINGE THREE TIMES DAILY AS DIRECTED     No current facility-administered medications on file prior to visit.     Allergies  Allergen Reactions  . Ferumoxytol Other (See Comments)    Patient has severe back and chest pain when receiving FERAHEME infusions. Patient has severe back and chest pain when receiving FERAHEME infusions.  Marland Kitchen  Tramadol Hcl Other (See Comments)    WEAKNESS WEAKNESS    Social History   Substance and Sexual Activity  Alcohol Use No  . Alcohol/week: 0.0 standard drinks    Social History   Tobacco Use  Smoking Status Never Smoker  Smokeless Tobacco Never Used    Review of Systems  Unable to perform ROS: Other    Objective   Vitals:   05/15/19 0910  BP: 140/68  Pulse: 85  Resp: 18  Temp: (!) 97.5 F (36.4 C)  SpO2: 99%    Physical Exam Vitals signs reviewed.  Constitutional:      Appearance: Normal appearance. She is not ill-appearing.  HENT:     Head: Normocephalic and atraumatic.  Cardiovascular:     Rate and Rhythm: Normal rate and regular rhythm.     Heart sounds: Murmur present. No friction rub. No gallop.      Comments: 2 out of 6 systolic ejection murmur Pulmonary:     Effort: Pulmonary effort is normal. No respiratory distress.     Breath sounds: Normal breath sounds. No stridor. No wheezing, rhonchi or rales.  Skin:    General: Skin is warm and dry.  Neurological:     Mental Status: She is alert.   GI notes reviewed  Assessment  Anemia secondary to blood loss, difficult IV access, thrombocytopenia, cirrhosis Plan   I tried to schedule the patient for Port-A-Cath, but she wants to wait to have it done after her procedure at [facility].  I told her that should they need to place a  PICC line, that was fine with me.  The risks and benefits of the procedure including bleeding, infection, pneumothorax, and the possibility of needing IV platelets were fully explained to the patient, who gave informed consent.

## 2019-05-15 NOTE — Patient Instructions (Signed)

## 2019-05-15 NOTE — Unmapped (Signed)
Call placed and spoke with Marilyn French - she reports continued need for near weekly blood transfusions being managed locally. EGD @ [place] [facility] scheduled to take place next week with [facility] - surgery follow up in October.

## 2019-05-16 ENCOUNTER — Encounter (HOSPITAL_COMMUNITY)
Admission: RE | Admit: 2019-05-16 | Discharge: 2019-05-16 | Disposition: A | Discharge status: Home / Self Care | Payer: PPO | Source: Ambulatory Visit | Attending: Internal Medicine | Admitting: Internal Medicine

## 2019-05-16 DIAGNOSIS — K922 Gastrointestinal hemorrhage, unspecified: Secondary | ICD-10-CM | POA: Diagnosis not present

## 2019-05-16 LAB — PREPARE RBC (CROSSMATCH)

## 2019-05-16 MED ORDER — FUROSEMIDE 10 MG/ML IJ SOLN
40.0000 mg | Freq: Once | INTRAMUSCULAR | Status: AC
  Administered 2019-05-16: 40 mg via INTRAVENOUS

## 2019-05-16 MED ORDER — ACETAMINOPHEN 325 MG PO TABS
650.0000 mg | ORAL_TABLET | Freq: Once | ORAL | Status: AC
  Administered 2019-05-16: 650 mg via ORAL

## 2019-05-16 MED ORDER — ACETAMINOPHEN 325 MG PO TABS
ORAL_TABLET | ORAL | Status: AC
  Filled 2019-05-16: qty 2

## 2019-05-16 MED ORDER — DIPHENHYDRAMINE HCL 25 MG PO CAPS
25.0000 mg | ORAL_CAPSULE | Freq: Once | ORAL | Status: AC
  Administered 2019-05-16: 25 mg via ORAL

## 2019-05-16 MED ORDER — SODIUM CHLORIDE 0.9% IV SOLUTION: Freq: Once | INTRAVENOUS | Status: DC

## 2019-05-16 MED ORDER — FUROSEMIDE 10 MG/ML IJ SOLN
INTRAMUSCULAR | Status: AC
  Filled 2019-05-16: qty 4

## 2019-05-16 MED ORDER — DIPHENHYDRAMINE HCL 25 MG PO CAPS
ORAL_CAPSULE | ORAL | Status: AC
  Filled 2019-05-16: qty 1

## 2019-05-17 LAB — TYPE AND SCREEN
ABO/RH(D): O NEG
Antibody Screen: NEGATIVE
Unit division: 0
Unit division: 0

## 2019-05-17 LAB — BPAM RBC
Blood Product Expiration Date: 202009212359
Blood Product Expiration Date: 202009212359
ISSUE DATE / TIME: 202008281011
ISSUE DATE / TIME: 202008281256
Unit Type and Rh: 9500
Unit Type and Rh: 9500

## 2019-05-19 ENCOUNTER — Encounter (INDEPENDENT_AMBULATORY_CARE_PROVIDER_SITE_OTHER): Payer: Self-pay

## 2019-05-20 ENCOUNTER — Ambulatory Visit: Admit: 2019-05-20 | Discharge: 2019-05-21 | Payer: PRIVATE HEALTH INSURANCE

## 2019-05-20 ENCOUNTER — Encounter: Admit: 2019-05-20 | Discharge: 2019-05-20 | Payer: PRIVATE HEALTH INSURANCE

## 2019-05-20 DIAGNOSIS — Z20828 Contact with and (suspected) exposure to other viral communicable diseases: Secondary | ICD-10-CM

## 2019-05-20 DIAGNOSIS — Z01812 Encounter for preprocedural laboratory examination: Secondary | ICD-10-CM

## 2019-05-20 DIAGNOSIS — Z01818 Encounter for other preprocedural examination: Secondary | ICD-10-CM

## 2019-05-20 NOTE — Unmapped (Signed)
Name:  Marilyn French  DOB: 10/19/1955  Today's Date: 05/20/2019  Age:  63 y.o.    Today's Visit Included:   Indication for Testing: Patient is scheduled for an upcoming procedure, surgery or treatment requiring pre-testing for COVID-19.    Informed the patient that their surgeon will be notified of results in 24-48 hours.     Advised patient to self-isolate and remain in his/her home until the surgery to minimize risk of COVID-19 infection prior to surgery.    Also advised patient to notify his/her surgeon/specialist immediately if the patient develops any new symptoms such as:  []  Subjective fever  []  Chills  []  Muscle aches  []  Runny nose  []  Sore throat  []  Loss of taste or smell   []  Cough (new or worsening of chronic cough)  []  Shortness of breath  []  Diarrhea (3 or more loose stools in the last 24 hours)  []  Fatigue (new or worsening)  []  Abdominal pain  []  Nausea or vomiting  []  Headache     Advised all household members and close contacts to remain in the house as much as possible as well.     COVID-19 testing performed.    Peggyann Shoals, CMA

## 2019-05-21 ENCOUNTER — Other Ambulatory Visit (HOSPITAL_COMMUNITY): Payer: Self-pay | Admitting: Cardiology

## 2019-05-22 ENCOUNTER — Ambulatory Visit: Payer: PPO | Admitting: General Surgery

## 2019-05-22 NOTE — Unmapped (Signed)
PATIENT LEFT VOICEMAIL MESSAGE STATING SHE HAS NO PREP INSTRUCTIONS FOR EGD TOMORROW  HAS MEDICATION QUESTIONS  INSTRUCTIONS WERE SENT VIA MY CHART HOWEVER PATIENT NEVER READ THEM    RETURNED CALL NO ANSWER  LEFT NAME AND CALL BACK NUMBER , OFFICE CLOSES @ 1600 CALL BEFORE THEN

## 2019-05-23 ENCOUNTER — Encounter
Admit: 2019-05-23 | Discharge: 2019-05-23 | Payer: PRIVATE HEALTH INSURANCE | Attending: Certified Registered" | Primary: Certified Registered"

## 2019-05-23 ENCOUNTER — Ambulatory Visit: Admit: 2019-05-23 | Discharge: 2019-05-23 | Payer: PRIVATE HEALTH INSURANCE

## 2019-05-23 DIAGNOSIS — Z794 Long term (current) use of insulin: Secondary | ICD-10-CM

## 2019-05-23 DIAGNOSIS — M797 Fibromyalgia: Secondary | ICD-10-CM

## 2019-05-23 DIAGNOSIS — Z79899 Other long term (current) drug therapy: Secondary | ICD-10-CM

## 2019-05-23 DIAGNOSIS — E7800 Pure hypercholesterolemia, unspecified: Secondary | ICD-10-CM

## 2019-05-23 DIAGNOSIS — K219 Gastro-esophageal reflux disease without esophagitis: Secondary | ICD-10-CM

## 2019-05-23 DIAGNOSIS — K31819 Angiodysplasia of stomach and duodenum without bleeding: Secondary | ICD-10-CM

## 2019-05-23 DIAGNOSIS — M810 Age-related osteoporosis without current pathological fracture: Secondary | ICD-10-CM

## 2019-05-23 DIAGNOSIS — D500 Iron deficiency anemia secondary to blood loss (chronic): Secondary | ICD-10-CM

## 2019-05-23 DIAGNOSIS — E039 Hypothyroidism, unspecified: Secondary | ICD-10-CM

## 2019-05-23 DIAGNOSIS — F329 Major depressive disorder, single episode, unspecified: Secondary | ICD-10-CM

## 2019-05-23 DIAGNOSIS — E119 Type 2 diabetes mellitus without complications: Secondary | ICD-10-CM

## 2019-05-23 DIAGNOSIS — I10 Essential (primary) hypertension: Secondary | ICD-10-CM

## 2019-05-23 DIAGNOSIS — K31811 Angiodysplasia of stomach and duodenum with bleeding: Secondary | ICD-10-CM | POA: Diagnosis not present

## 2019-05-26 ENCOUNTER — Encounter (INDEPENDENT_AMBULATORY_CARE_PROVIDER_SITE_OTHER): Payer: Self-pay

## 2019-05-27 ENCOUNTER — Ambulatory Visit: Payer: PPO | Admitting: General Surgery

## 2019-05-27 ENCOUNTER — Other Ambulatory Visit (INDEPENDENT_AMBULATORY_CARE_PROVIDER_SITE_OTHER): Payer: Self-pay | Admitting: *Deleted

## 2019-05-27 DIAGNOSIS — D500 Iron deficiency anemia secondary to blood loss (chronic): Secondary | ICD-10-CM

## 2019-05-27 DIAGNOSIS — K7460 Unspecified cirrhosis of liver: Secondary | ICD-10-CM

## 2019-05-27 DIAGNOSIS — K31819 Angiodysplasia of stomach and duodenum without bleeding: Secondary | ICD-10-CM

## 2019-05-27 DIAGNOSIS — R600 Localized edema: Secondary | ICD-10-CM

## 2019-05-27 DIAGNOSIS — D649 Anemia, unspecified: Secondary | ICD-10-CM

## 2019-05-28 ENCOUNTER — Telehealth (INDEPENDENT_AMBULATORY_CARE_PROVIDER_SITE_OTHER): Payer: Self-pay | Admitting: *Deleted

## 2019-05-28 ENCOUNTER — Other Ambulatory Visit (INDEPENDENT_AMBULATORY_CARE_PROVIDER_SITE_OTHER): Payer: Self-pay | Admitting: *Deleted

## 2019-05-28 ENCOUNTER — Other Ambulatory Visit (HOSPITAL_COMMUNITY)
Admission: RE | Admit: 2019-05-28 | Discharge: 2019-05-28 | Disposition: A | Discharge status: Home / Self Care | Payer: PPO | Source: Ambulatory Visit | Attending: Internal Medicine | Admitting: Internal Medicine

## 2019-05-28 DIAGNOSIS — K7460 Unspecified cirrhosis of liver: Secondary | ICD-10-CM | POA: Diagnosis not present

## 2019-05-28 DIAGNOSIS — K31819 Angiodysplasia of stomach and duodenum without bleeding: Secondary | ICD-10-CM | POA: Diagnosis not present

## 2019-05-28 DIAGNOSIS — E7800 Pure hypercholesterolemia, unspecified: Secondary | ICD-10-CM | POA: Diagnosis not present

## 2019-05-28 DIAGNOSIS — D500 Iron deficiency anemia secondary to blood loss (chronic): Secondary | ICD-10-CM | POA: Diagnosis not present

## 2019-05-28 DIAGNOSIS — E876 Hypokalemia: Secondary | ICD-10-CM

## 2019-05-28 DIAGNOSIS — E039 Hypothyroidism, unspecified: Secondary | ICD-10-CM | POA: Diagnosis not present

## 2019-05-28 DIAGNOSIS — K7290 Hepatic failure, unspecified without coma: Secondary | ICD-10-CM | POA: Diagnosis not present

## 2019-05-28 DIAGNOSIS — I10 Essential (primary) hypertension: Secondary | ICD-10-CM | POA: Diagnosis not present

## 2019-05-28 DIAGNOSIS — E1165 Type 2 diabetes mellitus with hyperglycemia: Secondary | ICD-10-CM | POA: Diagnosis not present

## 2019-05-28 LAB — COMPREHENSIVE METABOLIC PANEL
ALT: 32 U/L (ref 0–44)
AST: 38 U/L (ref 15–41)
Albumin: 2.6 g/dL — ABNORMAL LOW (ref 3.5–5.0)
Alkaline Phosphatase: 103 U/L (ref 38–126)
Anion gap: 11 (ref 5–15)
BUN: 33 mg/dL — ABNORMAL HIGH (ref 8–23)
CO2: 25 mmol/L (ref 22–32)
Calcium: 8 mg/dL — ABNORMAL LOW (ref 8.9–10.3)
Chloride: 100 mmol/L (ref 98–111)
Creatinine, Ser: 1.35 mg/dL — ABNORMAL HIGH (ref 0.44–1.00)
GFR calc Af Amer: 48 mL/min — ABNORMAL LOW (ref >60)
GFR calc non Af Amer: 41 mL/min — ABNORMAL LOW (ref >60)
Glucose, Bld: 116 mg/dL — ABNORMAL HIGH (ref 70–99)
Potassium: 2.5 mmol/L — CL (ref 3.5–5.1)
Sodium: 136 mmol/L (ref 135–145)
Total Bilirubin: 3.3 mg/dL — ABNORMAL HIGH (ref 0.3–1.2)
Total Protein: 5.5 g/dL — ABNORMAL LOW (ref 6.5–8.1)

## 2019-05-28 LAB — HEMOGLOBIN AND HEMATOCRIT, BLOOD
HCT: 27.6 % — ABNORMAL LOW (ref 36.0–46.0)
Hemoglobin: 7.8 g/dL — ABNORMAL LOW (ref 12.0–15.0)

## 2019-05-28 NOTE — Telephone Encounter (Signed)
Maria [name] returned call to the office. She was given the result of her K+ and the recommendations given by Dr.Rehman. She will have lab work as a followup 06/02/2019.

## 2019-05-28 NOTE — Telephone Encounter (Signed)
Maria [name] from lab @ [facility] called with a critical K+ of 2.5. This was also called and given to Maria [name].  Per Dr.Rehman the patient should stop all diurectics. She may go to the ED for further evaluation.  She is to take 40 mEq by mouth twice today and tomorrow for a total of 80 mEq Thereafter she is to take K+ 40 mEq by mouth daily.She should eat bananas,drink orange juice, food that are high in K+.  She will have B-Met and Magnesium drawn next Monday,06/02/2019. Patient was called on the home phone and a detailed message was left, several attempts have been made to reach her husband on his cell. Will continue to try and reach.

## 2019-05-29 ENCOUNTER — Other Ambulatory Visit (INDEPENDENT_AMBULATORY_CARE_PROVIDER_SITE_OTHER): Payer: Self-pay | Admitting: *Deleted

## 2019-05-29 DIAGNOSIS — Z1331 Encounter for screening for depression: Secondary | ICD-10-CM | POA: Diagnosis not present

## 2019-05-29 DIAGNOSIS — E876 Hypokalemia: Secondary | ICD-10-CM

## 2019-05-29 DIAGNOSIS — Z6835 Body mass index (BMI) 35.0-35.9, adult: Secondary | ICD-10-CM | POA: Diagnosis not present

## 2019-05-29 DIAGNOSIS — D649 Anemia, unspecified: Secondary | ICD-10-CM

## 2019-05-29 DIAGNOSIS — K7290 Hepatic failure, unspecified without coma: Secondary | ICD-10-CM | POA: Diagnosis not present

## 2019-05-29 DIAGNOSIS — Z79899 Other long term (current) drug therapy: Secondary | ICD-10-CM | POA: Diagnosis not present

## 2019-05-29 DIAGNOSIS — Z0000 Encounter for general adult medical examination without abnormal findings: Secondary | ICD-10-CM | POA: Diagnosis not present

## 2019-05-29 DIAGNOSIS — K7460 Unspecified cirrhosis of liver: Secondary | ICD-10-CM

## 2019-05-29 DIAGNOSIS — Z1339 Encounter for screening examination for other mental health and behavioral disorders: Secondary | ICD-10-CM | POA: Diagnosis not present

## 2019-05-29 DIAGNOSIS — R5383 Other fatigue: Secondary | ICD-10-CM | POA: Diagnosis not present

## 2019-05-29 DIAGNOSIS — I10 Essential (primary) hypertension: Secondary | ICD-10-CM | POA: Diagnosis not present

## 2019-05-29 DIAGNOSIS — E7800 Pure hypercholesterolemia, unspecified: Secondary | ICD-10-CM | POA: Diagnosis not present

## 2019-05-29 DIAGNOSIS — Z7189 Other specified counseling: Secondary | ICD-10-CM | POA: Diagnosis not present

## 2019-05-29 DIAGNOSIS — Z299 Encounter for prophylactic measures, unspecified: Secondary | ICD-10-CM | POA: Diagnosis not present

## 2019-05-29 MED ORDER — POTASSIUM CHLORIDE ER 10 MEQ PO TBCR: 10.0000 meq | EXTENDED_RELEASE_TABLET | Freq: Two times a day (BID) | ORAL | 3 refills | Status: DC

## 2019-05-29 NOTE — Progress Notes (Signed)
Per discussion with Dr. Arnoldo Morale and Dr. Laural Golden, will leave [facility] accessed post insertion on 06/04/19 and will receive 2 units PRBC in Day surgery clinic directly after recovery complete.  Patient called and is aware of PAT time of 9 am on 06/02/19 and plans for transfusion on 9/16.  Verbalized understanding.

## 2019-05-29 NOTE — H&P (Signed)
Maria [name]; [id number]; 1956-05-30   HPI Patient is a 63 year old white female with multiple medical problems who was referred to my care by Dr. Laural Golden for Port-A-Cath placement.  Patient requires frequent blood draws and blood transfusions due to chronic anemia due to blood loss.  She also suffers from thrombocytopenia.  She currently has a pain level of 2 out of 10 which is diffuse in nature.  She is undergoing multiple procedures, including a GI procedure at [facility] in 2 weeks.  Her blood transfusions are almost weekly in nature. Past Medical History:  Diagnosis Date  . Anemia   . CHF (congestive heart failure) ([place])   . Cirrhosis ([place])   . Depression   . Diabetes mellitus   . Esophageal bleed, non-variceal   . Eye hemorrhage    Behind left eye  . Fibromyalgia   . Hypercholesteremia   . Hypertension   . Hypothyroidism   . NAFLD (nonalcoholic fatty liver disease)   . Osteoarthrosis   . Osteoporosis   . PONV (postoperative nausea and vomiting)   . UTI (urinary tract infection) 11/12 end of the month   Patient feels that she passed a kidney stone at that time    Past Surgical History:  Procedure Laterality Date  . APPENDECTOMY  1980  . BILATERAL SALPINGOOPHORECTOMY    . CHOLECYSTECTOMY    . COLONOSCOPY  03/15/2011  . COLONOSCOPY N/A 03/24/2016   Procedure: COLONOSCOPY;  Surgeon: [name], MD;  [facility];  Service: Endoscopy;  Laterality: N/A;  855  . ESOPHAGEAL BANDING  04/24/2012   Procedure: ESOPHAGEAL BANDING;  Surgeon: [name], MD;  [facility];  Service: Endoscopy;  Laterality: N/A;  . Esophageal BANDING  08/03/2012   [facility] in [place] , [place]  09/24/2012   Procedure: ESOPHAGEAL BANDING;  Surgeon: [name], MD;  [facility]: [facility];  Service: Endoscopy;  Laterality: N/A;  Banding x 3  . ESOPHAGEAL BANDING N/A 01/29/2013   Procedure: ESOPHAGEAL BANDING;  Surgeon: [name],  MD;  [facility];  Service: Endoscopy;  Laterality: N/A;  . ESOPHAGEAL BANDING N/A 06/19/2014   Procedure: ESOPHAGEAL BANDING;  Surgeon: [name], MD;  [facility];  Service: Endoscopy;  Laterality: N/A;  . ESOPHAGEAL BANDING N/A 01/05/2016   Procedure: ESOPHAGEAL BANDING;  Surgeon: [name], MD;  [facility];  Service: Endoscopy;  Laterality: N/A;  . ESOPHAGEAL BANDING N/A 08/03/2016   Procedure: ESOPHAGEAL BANDING;  Surgeon: [name], MD;  [facility];  Service: Endoscopy;  Laterality: N/A;  . ESOPHAGEAL BANDING N/A 05/02/2017   Procedure: ESOPHAGEAL BANDING;  Surgeon: [name], MD;  [facility];  Service: Endoscopy;  Laterality: N/A;  . ESOPHAGOGASTRODUODENOSCOPY  04/24/2012   Procedure: ESOPHAGOGASTRODUODENOSCOPY (EGD);  Surgeon: [name], MD;  [facility];  Service: Endoscopy;  Laterality: N/A;  300  . ESOPHAGOGASTRODUODENOSCOPY N/A 01/29/2013   Procedure: ESOPHAGOGASTRODUODENOSCOPY (EGD);  Surgeon: [name], MD;  [facility];  Service: Endoscopy;  Laterality: N/A;  1200  . ESOPHAGOGASTRODUODENOSCOPY N/A 05/22/2013   Procedure: ESOPHAGOGASTRODUODENOSCOPY (EGD);  Surgeon: [name], MD;  [facility];  Service: Endoscopy;  Laterality: N/A;  1:55  . ESOPHAGOGASTRODUODENOSCOPY N/A 12/31/2013   Procedure: ESOPHAGOGASTRODUODENOSCOPY (EGD);  Surgeon: [name], MD;  [facility];  Service: Endoscopy;  Laterality: N/A;  1200  .  ESOPHAGOGASTRODUODENOSCOPY N/A 06/19/2014   Procedure: ESOPHAGOGASTRODUODENOSCOPY (EGD);  Surgeon: [name], MD;  [facility];  Service: Endoscopy;  Laterality: N/A;  1055  . ESOPHAGOGASTRODUODENOSCOPY N/A 01/15/2015   Procedure: ESOPHAGOGASTRODUODENOSCOPY (EGD);  Surgeon: [name], MD;  [facility];  Service: Endoscopy;  Laterality: N/A;  730 - moved to 9:45 - moved to 1250-Ann notified pt   . ESOPHAGOGASTRODUODENOSCOPY N/A 06/30/2015   Procedure: ESOPHAGOGASTRODUODENOSCOPY (EGD);  Surgeon: [name], MD;  [facility];  Service: Endoscopy;  Laterality: N/A;  1200  . ESOPHAGOGASTRODUODENOSCOPY N/A 01/05/2016   Procedure: ESOPHAGOGASTRODUODENOSCOPY (EGD);  Surgeon: [name], MD;  [facility];  Service: Endoscopy;  Laterality: N/A;  830  . ESOPHAGOGASTRODUODENOSCOPY N/A 08/03/2016   Procedure: ESOPHAGOGASTRODUODENOSCOPY (EGD);  Surgeon: [name], MD;  [facility];  Service: Endoscopy;  Laterality: N/A;  930  . ESOPHAGOGASTRODUODENOSCOPY N/A 05/02/2017   Procedure: ESOPHAGOGASTRODUODENOSCOPY (EGD);  Surgeon: [name], MD;  [facility];  Service: Endoscopy;  Laterality: N/A;  12:00  . ESOPHAGOGASTRODUODENOSCOPY N/A 08/17/2017   Procedure: ESOPHAGOGASTRODUODENOSCOPY (EGD);  Surgeon: [name], MD;  [facility];  Service: Endoscopy;  Laterality: N/A;  8:15  . ESOPHAGOGASTRODUODENOSCOPY N/A 09/12/2017   Procedure: ESOPHAGOGASTRODUODENOSCOPY (EGD);  Surgeon: [name], MD;  [facility];  Service: Endoscopy;  Laterality: N/A;  1040  . ESOPHAGOGASTRODUODENOSCOPY N/A 10/10/2017   Procedure: ESOPHAGOGASTRODUODENOSCOPY (EGD);  Surgeon: [name], MD;  [facility];  Service: Endoscopy;  Laterality: N/A;  225  . ESOPHAGOGASTRODUODENOSCOPY N/A 11/09/2017   Procedure: ESOPHAGOGASTRODUODENOSCOPY (EGD);  Surgeon: [name], MD;  [facility];  Service: Endoscopy;  Laterality: N/A;  . ESOPHAGOGASTRODUODENOSCOPY (EGD) WITH PROPOFOL  09/24/2012   Procedure: ESOPHAGOGASTRODUODENOSCOPY (EGD) WITH PROPOFOL;  Surgeon: [name], MD;  [facility];  Service: Endoscopy;  Laterality: N/A;  GE junction at 36  . ESOPHAGOGASTRODUODENOSCOPY (EGD) WITH PROPOFOL N/A 07/06/2017   Procedure: ESOPHAGOGASTRODUODENOSCOPY (EGD) WITH PROPOFOL;  Surgeon: [name],  MD;  [facility];  Service: Endoscopy;  Laterality: N/A;  . ESOPHAGOGASTRODUODENOSCOPY W/ BANDING  08/2010  . GIVENS CAPSULE STUDY N/A 07/05/2017   Procedure: GIVENS CAPSULE STUDY;  Surgeon: [name], MD;  [facility];  Service: Endoscopy;  Laterality: N/A;  . HOT HEMOSTASIS N/A 08/17/2017   Procedure: HOT HEMOSTASIS (ARGON PLASMA COAGULATION/BICAP);  Surgeon: [name], MD;  [facility];  Service: Endoscopy;  Laterality: N/A;  . HOT HEMOSTASIS  09/12/2017   Procedure: HOT HEMOSTASIS (ARGON PLASMA COAGULATION/BICAP);  Surgeon: [name], MD;  [facility];  Service: Endoscopy;;  gastric  . HOT HEMOSTASIS  10/10/2017   Procedure: HOT HEMOSTASIS (ARGON PLASMA COAGULATION/BICAP);  Surgeon: [name], MD;  [facility];  Service: Endoscopy;;  . POLYPECTOMY  03/24/2016   Procedure: POLYPECTOMY;  Surgeon: [name], MD;  [facility];  Service: Endoscopy;;  sigmoid polyp  . RIGHT HEART CATH N/A 04/16/2017   Procedure: Right Heart Cath;  Surgeon: [name], MD;  Location: [place] [facility];  Service: Cardiovascular;  Laterality: N/A;  . RIGHT HEART CATH N/A 07/12/2017   Procedure: RIGHT HEART CATH;  Surgeon: [name], MD;  Location: [place] [facility];  Service: Cardiovascular;  Laterality: N/A;  . TONSILLECTOMY    . UPPER GASTROINTESTINAL ENDOSCOPY  03/15/2011   [facility] ED BANDING/TCS  . UPPER  GASTROINTESTINAL ENDOSCOPY  09/05/2010  . UPPER GASTROINTESTINAL ENDOSCOPY  08/11/2010  . VAGINAL HYSTERECTOMY      Family History  Problem Relation Age of Onset  . Lung cancer Mother   . Diabetes Father   . Diabetes Sister   . Hypertension Sister   . Hypothyroidism Sister   . Colon cancer Brother   . Diabetes Sister   . Hypothyroidism Brother   . Healthy Daughter   . Obesity Daughter   . Hypertension Daughter     Current Outpatient Medications on File Prior to Visit  Medication Sig  Dispense Refill  . busPIRone (BUSPAR) 5 MG tablet Take 10 mg by mouth 2 (two) times daily.     . cholecalciferol (VITAMIN D) 1000 UNITS tablet Take 1,000 Units by mouth 3 (three) times a week.     Marland Kitchen HUMALOG 100 UNIT/ML injection Inject 12-20 Units into the skin 3 (three) times daily with meals.   6  . Insulin Glargine (LANTUS SOLOSTAR) 100 UNIT/ML Solostar Pen Inject 40 Units into the skin daily at 10 pm. (Patient taking differently: Inject 55 Units into the skin every morning. )    . Insulin Glargine (LANTUS SOLOSTAR) 100 UNIT/ML Solostar Pen Inject into the skin.    . Insulin Pen Needle 31G X 5 MM MISC Use with each insulin injection 100 each 12  . lactulose (CHRONULAC) 10 GM/15ML solution Take 45 mLs (30 g total) by mouth 3 (three) times daily.    Marland Kitchen levothyroxine (SYNTHROID, LEVOTHROID) 25 MCG tablet Take 25 mcg by mouth daily before breakfast.     . magnesium oxide (MAG-OX) 400 MG tablet Take 400 mg by mouth daily.     . multivitamin (VIT W/EXTRA C) CHEW chewable tablet Chew 1 tablet by mouth daily.    . OXYGEN Inhale 2 L into the lungs at bedtime.     . pantoprazole (PROTONIX) 40 MG tablet Take 1 tablet (40 mg total) by mouth 2 (two) times daily. 180 tablet 3  . potassium chloride (K-DUR) 10 MEQ tablet Take 1 tablet (10 mEq total) by mouth 2 (two) times daily. 180 tablet 3  . rifaximin (XIFAXAN) 550 MG TABS tablet Take 1 tablet (550 mg total) by mouth 2 (two) times daily.    . sertraline (ZOLOFT) 50 MG tablet Take 50 mg by mouth daily.    . sertraline (ZOLOFT) 50 MG tablet Take by mouth.    . spironolactone (ALDACTONE) 50 MG tablet TAKE 1 TABLET BY MOUTH TWICE DAILY 60 tablet 2  . sucralfate (CARAFATE) 1 GM/10ML suspension Take 1 g by mouth 4 (four) times daily.     Marland Kitchen torsemide (DEMADEX) 20 MG tablet Take 1 tablet (20 mg total) by mouth daily. Please call for OV [phone] 30 tablet 2  . trimethoprim-polymyxin b (POLYTRIM) ophthalmic solution Place 1 drop into the left eye See admin  instructions. Use 3 to 4 times daily for 2 days following monthly eye injection  12  . zinc sulfate 220 (50 Zn) MG capsule Take 1 capsule (220 mg total) by mouth daily. 30 capsule 1  . RELION INSULIN SYRINGE 31G X 15/64" 0.5 ML MISC USE 1 SYRINGE THREE TIMES DAILY AS DIRECTED     No current facility-administered medications on file prior to visit.     Allergies  Allergen Reactions  . Ferumoxytol Other (See Comments)    Patient has severe back and chest pain when receiving FERAHEME infusions. Patient has severe back and chest pain when receiving FERAHEME infusions.  Marland Kitchen  Tramadol Hcl Other (See Comments)    WEAKNESS WEAKNESS    Social History   Substance and Sexual Activity  Alcohol Use No  . Alcohol/week: 0.0 standard drinks    Social History   Tobacco Use  Smoking Status Never Smoker  Smokeless Tobacco Never Used    Review of Systems  Unable to perform ROS: Other    Objective   Vitals:   05/15/19 0910  BP: 140/68  Pulse: 85  Resp: 18  Temp: (!) 97.5 F (36.4 C)  SpO2: 99%    Physical Exam Vitals signs reviewed.  Constitutional:      Appearance: Normal appearance. She is not ill-appearing.  HENT:     Head: Normocephalic and atraumatic.  Cardiovascular:     Rate and Rhythm: Normal rate and regular rhythm.     Heart sounds: Murmur present. No friction rub. No gallop.      Comments: 2 out of 6 systolic ejection murmur Pulmonary:     Effort: Pulmonary effort is normal. No respiratory distress.     Breath sounds: Normal breath sounds. No stridor. No wheezing, rhonchi or rales.  Skin:    General: Skin is warm and dry.  Neurological:     Mental Status: She is alert.   GI notes reviewed  Assessment  Anemia secondary to blood loss, difficult IV access, thrombocytopenia, cirrhosis Plan   I tried to schedule the patient for Port-A-Cath, but she wants to wait to have it done after her procedure at [facility].  I told her that should they need to place a  PICC line, that was fine with me.  The risks and benefits of the procedure including bleeding, infection, pneumothorax, and the possibility of needing IV platelets were fully explained to the patient, who gave informed consent.

## 2019-05-30 ENCOUNTER — Encounter (HOSPITAL_COMMUNITY): Payer: PPO

## 2019-05-30 NOTE — Patient Instructions (Addendum)
Your procedure is scheduled on: 06/04/2019  Report to [facility] at    6:15 AM.  Call this number if you have problems the morning of surgery: [phone]   Remember:   Do not Eat or Drink after midnight   :  Take these medicines the morning of surgery with A SIP OF WATER:Levothyroxine, buspar and zoloft              Take only 1/2 dose Lantus 5 units the night before surgery              No Lantus am of surgery   Do not wear jewelry, make-up or nail polish.  Do not wear lotions, powders, or perfumes. You may wear deodorant.  Do not shave 48 hours prior to surgery. Men may shave face and neck.  Do not bring valuables to the hospital.  Contacts, dentures or bridgework may not be worn into surgery.  Leave suitcase in the car. After surgery it may be brought to your room.  For patients admitted to the hospital, checkout time is 11:00 AM the day of discharge.   Patients discharged the day of surgery will not be allowed to drive home.    Special Instructions: Shower using CHG night before surgery and shower the day of surgery use CHG.  Use special wash - you have one bottle of CHG for all showers.  You should use approximately 1/2 of the bottle for each shower.  Implanted Port Insertion, Care After This sheet gives you information about how to care for yourself after your procedure. Your health care provider may also give you more specific instructions. If you have problems or questions, contact your health care provider. What can I expect after the procedure? After the procedure, it is common to have:  Discomfort at the port insertion site.  Bruising on the skin over the port. This should improve over 3-4 days. Follow these instructions at home: [facility] care  After your port is placed, you will get a manufacturer's information card. The card has information about your port. Keep this card with you at all times.  Take care of the port as told by your health care provider. Ask your  health care provider if you or a family member can get training for taking care of the port at home. A home health care nurse may also take care of the port.  Make sure to remember what type of port you have. Incision care      Follow instructions from your health care provider about how to take care of your port insertion site. Make sure you: ? Wash your hands with soap and water before and after you change your bandage (dressing). If soap and water are not available, use hand sanitizer. ? Change your dressing as told by your health care provider. ? Leave stitches (sutures), skin glue, or adhesive strips in place. These skin closures may need to stay in place for 2 weeks or longer. If adhesive strip edges start to loosen and curl up, you may trim the loose edges. Do not remove adhesive strips completely unless your health care provider tells you to do that.  Check your port insertion site every day for signs of infection. Check for: ? Redness, swelling, or pain. ? Fluid or blood. ? Warmth. ? Pus or a bad smell. Activity  Return to your normal activities as told by your health care provider. Ask your health care provider what activities are safe for you.  Do not lift anything that is heavier than 10 lb (4.5 kg), or the limit that you are told, until your health care provider says that it is safe. General instructions  Take over-the-counter and prescription medicines only as told by your health care provider.  Do not take baths, swim, or use a hot tub until your health care provider approves. Ask your health care provider if you may take showers. You may only be allowed to take sponge baths.  Do not drive for 24 hours if you were given a sedative during your procedure.  Wear a medical alert bracelet in case of an emergency. This will tell any health care providers that you have a port.  Keep all follow-up visits as told by your health care provider. This is important. Contact a  health care provider if:  You cannot flush your port with saline as directed, or you cannot draw blood from the port.  You have a fever or chills.  You have redness, swelling, or pain around your port insertion site.  You have fluid or blood coming from your port insertion site.  Your port insertion site feels warm to the touch.  You have pus or a bad smell coming from the port insertion site. Get help right away if:  You have chest pain or shortness of breath.  You have bleeding from your port that you cannot control. Summary  Take care of the port as told by your health care provider. Keep the manufacturer's information card with you at all times.  Change your dressing as told by your health care provider.  Contact a health care provider if you have a fever or chills or if you have redness, swelling, or pain around your port insertion site.  Keep all follow-up visits as told by your health care provider. This information is not intended to replace advice given to you by your health care provider. Make sure you discuss any questions you have with your health care provider. Document Released: 06/25/2013 Document Revised: 04/02/2018 Document Reviewed: 04/02/2018 Elsevier Patient Education  [place] After These instructions provide you with information about caring for yourself after your procedure. Your health care provider may also give you more specific instructions. Your treatment has been planned according to current medical practices, but problems sometimes occur. Call your health care provider if you have any problems or questions after your procedure. What can I expect after the procedure? After your procedure, you may:  Feel sleepy for several hours.  Feel clumsy and have poor balance for several hours.  Feel forgetful about what happened after the procedure.  Have poor judgment for several hours.  Feel nauseous or  vomit.  Have a sore throat if you had a breathing tube during the procedure. Follow these instructions at home: For at least 24 hours after the procedure:      Have a responsible adult stay with you. It is important to have someone help care for you until you are awake and alert.  Rest as needed.  Do not: ? Participate in activities in which you could fall or become injured. ? Drive. ? Use heavy machinery. ? Drink alcohol. ? Take sleeping pills or medicines that cause drowsiness. ? Make important decisions or sign legal documents. ? Take care of children on your own. Eating and drinking  Follow the diet that is recommended by your health care provider.  If you vomit, drink water, juice, or soup when  you can drink without vomiting.  Make sure you have little or no nausea before eating solid foods. General instructions  Take over-the-counter and prescription medicines only as told by your health care provider.  If you have sleep apnea, surgery and certain medicines can increase your risk for breathing problems. Follow instructions from your health care provider about wearing your sleep device: ? Anytime you are sleeping, including during daytime naps. ? While taking prescription pain medicines, sleeping medicines, or medicines that make you drowsy.  If you smoke, do not smoke without supervision.  Keep all follow-up visits as told by your health care provider. This is important. Contact a health care provider if:  You keep feeling nauseous or you keep vomiting.  You feel light-headed.  You develop a rash.  You have a fever. Get help right away if:  You have trouble breathing. Summary  For several hours after your procedure, you may feel sleepy and have poor judgment.  Have a responsible adult stay with you for at least 24 hours or until you are awake and alert. This information is not intended to replace advice given to you by your health care provider. Make sure  you discuss any questions you have with your health care provider. Document Released: 12/26/2015 Document Revised: 12/03/2017 Document Reviewed: 12/26/2015 Elsevier Patient Education  2020 [employer].

## 2019-05-31 ENCOUNTER — Other Ambulatory Visit: Payer: Self-pay

## 2019-05-31 ENCOUNTER — Inpatient Hospital Stay (HOSPITAL_COMMUNITY)
Admission: EM | Admit: 2019-05-31 | Discharge: 2019-06-02 | DRG: 982 | Disposition: A | Discharge status: Home / Self Care | Payer: PPO | Attending: Family Medicine | Admitting: Family Medicine

## 2019-05-31 ENCOUNTER — Encounter (HOSPITAL_COMMUNITY): Payer: Self-pay | Admitting: Emergency Medicine

## 2019-05-31 DIAGNOSIS — I110 Hypertensive heart disease with heart failure: Secondary | ICD-10-CM | POA: Diagnosis present

## 2019-05-31 DIAGNOSIS — F329 Major depressive disorder, single episode, unspecified: Secondary | ICD-10-CM | POA: Diagnosis present

## 2019-05-31 DIAGNOSIS — Z801 Family history of malignant neoplasm of trachea, bronchus and lung: Secondary | ICD-10-CM | POA: Diagnosis not present

## 2019-05-31 DIAGNOSIS — K7460 Unspecified cirrhosis of liver: Secondary | ICD-10-CM | POA: Diagnosis present

## 2019-05-31 DIAGNOSIS — K219 Gastro-esophageal reflux disease without esophagitis: Secondary | ICD-10-CM | POA: Diagnosis present

## 2019-05-31 DIAGNOSIS — Z79890 Hormone replacement therapy (postmenopausal): Secondary | ICD-10-CM

## 2019-05-31 DIAGNOSIS — K7581 Nonalcoholic steatohepatitis (NASH): Secondary | ICD-10-CM | POA: Diagnosis present

## 2019-05-31 DIAGNOSIS — F419 Anxiety disorder, unspecified: Secondary | ICD-10-CM | POA: Diagnosis present

## 2019-05-31 DIAGNOSIS — Z800 Family history of malignant neoplasm of digestive organs: Secondary | ICD-10-CM | POA: Diagnosis not present

## 2019-05-31 DIAGNOSIS — E876 Hypokalemia: Secondary | ICD-10-CM | POA: Diagnosis present

## 2019-05-31 DIAGNOSIS — K766 Portal hypertension: Secondary | ICD-10-CM | POA: Diagnosis present

## 2019-05-31 DIAGNOSIS — Z90710 Acquired absence of both cervix and uterus: Secondary | ICD-10-CM

## 2019-05-31 DIAGNOSIS — M797 Fibromyalgia: Secondary | ICD-10-CM | POA: Diagnosis present

## 2019-05-31 DIAGNOSIS — E119 Type 2 diabetes mellitus without complications: Secondary | ICD-10-CM | POA: Diagnosis present

## 2019-05-31 DIAGNOSIS — D500 Iron deficiency anemia secondary to blood loss (chronic): Secondary | ICD-10-CM | POA: Diagnosis present

## 2019-05-31 DIAGNOSIS — K31819 Angiodysplasia of stomach and duodenum without bleeding: Secondary | ICD-10-CM | POA: Diagnosis present

## 2019-05-31 DIAGNOSIS — I8500 Esophageal varices without bleeding: Secondary | ICD-10-CM | POA: Diagnosis not present

## 2019-05-31 DIAGNOSIS — Z833 Family history of diabetes mellitus: Secondary | ICD-10-CM

## 2019-05-31 DIAGNOSIS — Z9981 Dependence on supplemental oxygen: Secondary | ICD-10-CM

## 2019-05-31 DIAGNOSIS — M810 Age-related osteoporosis without current pathological fracture: Secondary | ICD-10-CM | POA: Diagnosis present

## 2019-05-31 DIAGNOSIS — E7800 Pure hypercholesterolemia, unspecified: Secondary | ICD-10-CM | POA: Diagnosis present

## 2019-05-31 DIAGNOSIS — Z20828 Contact with and (suspected) exposure to other viral communicable diseases: Secondary | ICD-10-CM | POA: Diagnosis present

## 2019-05-31 DIAGNOSIS — Z79899 Other long term (current) drug therapy: Secondary | ICD-10-CM

## 2019-05-31 DIAGNOSIS — D649 Anemia, unspecified: Secondary | ICD-10-CM | POA: Diagnosis not present

## 2019-05-31 DIAGNOSIS — J9611 Chronic respiratory failure with hypoxia: Secondary | ICD-10-CM | POA: Diagnosis present

## 2019-05-31 DIAGNOSIS — E785 Hyperlipidemia, unspecified: Secondary | ICD-10-CM | POA: Diagnosis present

## 2019-05-31 DIAGNOSIS — Z794 Long term (current) use of insulin: Secondary | ICD-10-CM | POA: Diagnosis not present

## 2019-05-31 DIAGNOSIS — I8510 Secondary esophageal varices without bleeding: Secondary | ICD-10-CM | POA: Diagnosis present

## 2019-05-31 DIAGNOSIS — D61818 Other pancytopenia: Secondary | ICD-10-CM | POA: Diagnosis present

## 2019-05-31 DIAGNOSIS — IMO0001 Reserved for inherently not codable concepts without codable children: Secondary | ICD-10-CM

## 2019-05-31 DIAGNOSIS — Z95828 Presence of other vascular implants and grafts: Secondary | ICD-10-CM

## 2019-05-31 DIAGNOSIS — D6959 Other secondary thrombocytopenia: Secondary | ICD-10-CM | POA: Diagnosis present

## 2019-05-31 DIAGNOSIS — Z452 Encounter for adjustment and management of vascular access device: Secondary | ICD-10-CM | POA: Diagnosis not present

## 2019-05-31 DIAGNOSIS — E039 Hypothyroidism, unspecified: Secondary | ICD-10-CM | POA: Diagnosis present

## 2019-05-31 DIAGNOSIS — R531 Weakness: Secondary | ICD-10-CM | POA: Diagnosis present

## 2019-05-31 DIAGNOSIS — I509 Heart failure, unspecified: Secondary | ICD-10-CM | POA: Diagnosis present

## 2019-05-31 LAB — COMPREHENSIVE METABOLIC PANEL
ALT: 31 U/L (ref 0–44)
ALT: 31 U/L (ref 0–44)
AST: 55 U/L — ABNORMAL HIGH (ref 15–41)
AST: 41 U/L (ref 15–41)
Albumin: 2.2 g/dL — ABNORMAL LOW (ref 3.5–5.0)
Albumin: 2.2 g/dL — ABNORMAL LOW (ref 3.5–5.0)
Alkaline Phosphatase: 86 U/L (ref 38–126)
Alkaline Phosphatase: 92 U/L (ref 38–126)
Anion gap: 9 (ref 5–15)
Anion gap: 8 (ref 5–15)
BUN: 22 mg/dL (ref 8–23)
BUN: 21 mg/dL (ref 8–23)
CO2: 19 mmol/L — ABNORMAL LOW (ref 22–32)
CO2: 21 mmol/L — ABNORMAL LOW (ref 22–32)
Calcium: 8.4 mg/dL — ABNORMAL LOW (ref 8.9–10.3)
Calcium: 8.4 mg/dL — ABNORMAL LOW (ref 8.9–10.3)
Chloride: 109 mmol/L (ref 98–111)
Chloride: 109 mmol/L (ref 98–111)
Creatinine, Ser: 0.89 mg/dL (ref 0.44–1.00)
Creatinine, Ser: 0.96 mg/dL (ref 0.44–1.00)
GFR calc Af Amer: >60 mL/min (ref >60)
GFR calc Af Amer: >60 mL/min (ref >60)
GFR calc non Af Amer: >60 mL/min (ref >60)
GFR calc non Af Amer: >60 mL/min (ref >60)
Glucose, Bld: 196 mg/dL — ABNORMAL HIGH (ref 70–99)
Glucose, Bld: 170 mg/dL — ABNORMAL HIGH (ref 70–99)
Potassium: 4.7 mmol/L (ref 3.5–5.1)
Potassium: 4.1 mmol/L (ref 3.5–5.1)
Sodium: 137 mmol/L (ref 135–145)
Sodium: 138 mmol/L (ref 135–145)
Total Bilirubin: 2.8 mg/dL — ABNORMAL HIGH (ref 0.3–1.2)
Total Bilirubin: 2.9 mg/dL — ABNORMAL HIGH (ref 0.3–1.2)
Total Protein: 4.9 g/dL — ABNORMAL LOW (ref 6.5–8.1)
Total Protein: 4.9 g/dL — ABNORMAL LOW (ref 6.5–8.1)

## 2019-05-31 LAB — URINALYSIS, ROUTINE W REFLEX MICROSCOPIC
Bilirubin Urine: NEGATIVE
Glucose, UA: NEGATIVE mg/dL
Hgb urine dipstick: NEGATIVE
Ketones, ur: NEGATIVE mg/dL
Leukocytes,Ua: NEGATIVE
Nitrite: NEGATIVE
Protein, ur: NEGATIVE mg/dL
Specific Gravity, Urine: 1.019 (ref 1.005–1.030)
pH: 6 (ref 5.0–8.0)

## 2019-05-31 LAB — CBC WITH DIFFERENTIAL/PLATELET
Abs Immature Granulocytes: 0.01 10*3/uL (ref 0.00–0.07)
Basophils Absolute: 0 10*3/uL (ref 0.0–0.1)
Basophils Relative: 0 %
Eosinophils Absolute: 0.1 10*3/uL (ref 0.0–0.5)
Eosinophils Relative: 2 %
HCT: 21.2 % — ABNORMAL LOW (ref 36.0–46.0)
Hemoglobin: 5.9 g/dL — CL (ref 12.0–15.0)
Immature Granulocytes: 0 %
Lymphocytes Relative: 27 %
Lymphs Abs: 1.1 10*3/uL (ref 0.7–4.0)
MCH: 26.1 pg (ref 26.0–34.0)
MCHC: 27.8 g/dL — ABNORMAL LOW (ref 30.0–36.0)
MCV: 93.8 fL (ref 80.0–100.0)
Monocytes Absolute: 0.5 10*3/uL (ref 0.1–1.0)
Monocytes Relative: 11 %
Neutro Abs: 2.5 10*3/uL (ref 1.7–7.7)
Neutrophils Relative %: 60 %
Platelets: 71 10*3/uL — ABNORMAL LOW (ref 150–400)
RBC: 2.26 MIL/uL — ABNORMAL LOW (ref 3.87–5.11)
RDW: 19.2 % — ABNORMAL HIGH (ref 11.5–15.5)
WBC: 4.2 10*3/uL (ref 4.0–10.5)
nRBC: 0 % (ref 0.0–0.2)

## 2019-05-31 LAB — PREPARE RBC (CROSSMATCH)

## 2019-05-31 MED ORDER — SODIUM CHLORIDE 0.9% FLUSH
3.0000 mL | Freq: Once | INTRAVENOUS | Status: AC
  Administered 2019-06-01: 3 mL via INTRAVENOUS

## 2019-05-31 MED ORDER — SODIUM CHLORIDE 0.9% IV SOLUTION
Freq: Once | INTRAVENOUS | Status: AC
  Administered 2019-06-01: 04:00:00 via INTRAVENOUS

## 2019-05-31 NOTE — ED Provider Notes (Signed)
[facility] EMERGENCY DEPARTMENT Provider Note   CSN: [id number] Arrival date & time: 05/31/19  2054     History   Chief Complaint Chief Complaint  Patient presents with  . Weakness    HPI Maria [name] is a 63 y.o. female.     Patient is a 63 year old female who presents to the emergency department with a complaint of weakness. Patient has a history of thrombocytopenia, chronic anemia due to blood loss, variceal bleeds, nonalcoholic cirrhosis, hepatic encephalopathy. Patient states she was told to come to the emergency room by her GI specialist because of low hemoglobin.  The patient has chronic blood loss anemia.  She has been receiving blood transfusions regularly over the last few months.  She has a history of liver failure related to nonalcohol cirrhosis.  Patient has been dealing with increasing weakness at home.  She has a history of variceal bleeds, and has had GI procedures at [facility] to assist with this.  She also has a history of thrombocytopenia.  Maria.Rehman has requested the patient to be admitted for blood transfusion.  The patient's hemoglobin has been dropping from the 7-6 range few days ago.  Maria. Arnoldo Morale is also planning to do a Port-A-Cath while the patient is in the emergency department.  The patient complains of increasing weakness and generally not feeling well.  She notices this usually when her hemoglobin is very low.     The history is provided by the patient and the spouse (Maria [name]).  Weakness Associated symptoms: no abdominal pain, no chest pain, no cough, no diarrhea, no dizziness, no dysuria, no frequency, no myalgias, no nausea, no seizures, no shortness of breath and no vomiting     Past Medical History:  Diagnosis Date  . Anemia   . CHF (congestive heart failure) ([place])   . Cirrhosis ([place])   . Depression   . Diabetes mellitus   . Esophageal bleed, non-variceal   . Eye hemorrhage    Behind left eye  . Fibromyalgia   .  Hypercholesteremia   . Hypertension   . Hypothyroidism   . NAFLD (nonalcoholic fatty liver disease)   . Osteoarthrosis   . Osteoporosis   . PONV (postoperative nausea and vomiting)   . UTI (urinary tract infection) 11/12 end of the month   Patient feels that she passed a kidney stone at that time    Patient Active Problem List   Diagnosis Date Noted  . Altered mental status 02/27/2019  . Bacteriuria 02/27/2019  . Acute on chronic anemia 02/24/2019  . Acute hepatic encephalopathy 07/26/2018  . Obesity, Class III, BMI 40-49.9 (morbid obesity) ([place])   . Melena   . Anemia 07/02/2018  . UGI bleed 11/09/2017  . Insulin dependent diabetes mellitus ([place])   . Absolute anemia 09/13/2017  . Idiopathic esophageal varices with bleeding ([place]) 09/13/2017  . Other cirrhosis of liver ([place]) 08/22/2017  . NAFLD (nonalcoholic fatty liver disease) 08/14/2017  . Encephalopathy, hepatic ([place]) 08/14/2017  . Metabolic encephalopathy 27/78/2423  . Hyperammonemia ([place]) 07/18/2017  . Hypothyroidism 07/18/2017  . Acute on chronic right-sided congestive heart failure ([place]) 07/06/2017  . Pulmonary hypertension ([place]) 07/06/2017  . GAVE (gastric antral vascular ectasia) 07/06/2017  . GI bleeding 07/06/2017  . Symptomatic anemia 07/04/2017  . Hypokalemia 07/04/2017  . Chronic diastolic heart failure ([place]) 06/11/2017  . CAD (coronary artery disease) 06/11/2017  . Nocturnal hypoxemia 02/22/2017  . Idiopathic esophageal varices without bleeding ([place]) 01/31/2017  . Dyspnea and respiratory abnormalities 01/25/2017  .  Esophageal varices in cirrhosis ([place]) 05/19/2016  . Hepatic cirrhosis ([place]) 05/19/2016  . Esophageal varices ([place]) 12/09/2012  . Dysphagia, unspecified(787.20) 12/09/2012  . Anxiety 08/26/2012  . Cirrhosis, nonalcoholic ([place]) 53/74/8270  . Diabetes mellitus ([place]) 10/09/2011  . History of esophageal varices 10/09/2011  . Iron deficiency anemia 10/09/2011  . GERD (gastroesophageal reflux  disease) 10/09/2011  . Thrombocytopenia ([place]) 10/09/2011    Past Surgical History:  Procedure Laterality Date  . APPENDECTOMY  1980  . BILATERAL SALPINGOOPHORECTOMY    . CHOLECYSTECTOMY    . COLONOSCOPY  03/15/2011  . COLONOSCOPY N/A 03/24/2016   Procedure: COLONOSCOPY;  Surgeon: [name], MD;  Location[facility];  Service: Endoscopy;  Laterality: N/A;  855  . ESOPHAGEAL BANDING  04/24/2012   Procedure: ESOPHAGEAL BANDING;  Surgeon: [name], MD;  [facility];  Service: Endoscopy;  Laterality: N/A;  . Esophageal BANDING  08/03/2012   [facility] in [place] , [place]  09/24/2012   Procedure: ESOPHAGEAL BANDING;  Surgeon: [name], MD;  Location: [facility] ORS;  Service: Endoscopy;  Laterality: N/A;  Banding x 3  . ESOPHAGEAL BANDING N/A 01/29/2013   Procedure: ESOPHAGEAL BANDING;  Surgeon: [name], MD;  [facility];  Service: Endoscopy;  Laterality: N/A;  . ESOPHAGEAL BANDING N/A 06/19/2014   Procedure: ESOPHAGEAL BANDING;  Surgeon: [name], MD;  [facility];  Service: Endoscopy;  Laterality: N/A;  . ESOPHAGEAL BANDING N/A 01/05/2016   Procedure: ESOPHAGEAL BANDING;  Surgeon: [name], MD;  [facility];  Service: Endoscopy;  Laterality: N/A;  . ESOPHAGEAL BANDING N/A 08/03/2016   Procedure: ESOPHAGEAL BANDING;  Surgeon: [name], MD;  [facility];  Service: Endoscopy;  Laterality: N/A;  . ESOPHAGEAL BANDING N/A 05/02/2017   Procedure: ESOPHAGEAL BANDING;  Surgeon: [name], MD;  [facility];  Service: Endoscopy;  Laterality: N/A;  . ESOPHAGOGASTRODUODENOSCOPY  04/24/2012   Procedure: ESOPHAGOGASTRODUODENOSCOPY (EGD);  Surgeon: [name], MD;  [facility];  Service: Endoscopy;  Laterality: N/A;  300  . ESOPHAGOGASTRODUODENOSCOPY N/A 01/29/2013   Procedure: ESOPHAGOGASTRODUODENOSCOPY (EGD);  Surgeon: [name], MD;   [facility];  Service: Endoscopy;  Laterality: N/A;  1200  . ESOPHAGOGASTRODUODENOSCOPY N/A 05/22/2013   Procedure: ESOPHAGOGASTRODUODENOSCOPY (EGD);  Surgeon: [name], MD;  [facility];  Service: Endoscopy;  Laterality: N/A;  1:55  . ESOPHAGOGASTRODUODENOSCOPY N/A 12/31/2013   Procedure: ESOPHAGOGASTRODUODENOSCOPY (EGD);  Surgeon: [name], MD;  [facility];  Service: Endoscopy;  Laterality: N/A;  1200  . ESOPHAGOGASTRODUODENOSCOPY N/A 06/19/2014   Procedure: ESOPHAGOGASTRODUODENOSCOPY (EGD);  Surgeon: [name], MD;  [facility];  Service: Endoscopy;  Laterality: N/A;  1055  . ESOPHAGOGASTRODUODENOSCOPY N/A 01/15/2015   Procedure: ESOPHAGOGASTRODUODENOSCOPY (EGD);  Surgeon: [name], MD;  [facility];  Service: Endoscopy;  Laterality: N/A;  730 - moved to 9:45 - moved to 1250-Ann notified pt  . ESOPHAGOGASTRODUODENOSCOPY N/A 06/30/2015   Procedure: ESOPHAGOGASTRODUODENOSCOPY (EGD);  Surgeon: [name], MD;  [facility];  Service: Endoscopy;  Laterality: N/A;  1200  . ESOPHAGOGASTRODUODENOSCOPY N/A 01/05/2016   Procedure: ESOPHAGOGASTRODUODENOSCOPY (EGD);  Surgeon: [name], MD;  [facility];  Service: Endoscopy;  Laterality: N/A;  830  . ESOPHAGOGASTRODUODENOSCOPY N/A 08/03/2016   Procedure: ESOPHAGOGASTRODUODENOSCOPY (EGD);  Surgeon: [name], MD;  [facility]  [facility];  Service: Endoscopy;  Laterality: N/A;  930  . ESOPHAGOGASTRODUODENOSCOPY N/A 05/02/2017   Procedure: ESOPHAGOGASTRODUODENOSCOPY (EGD);  Surgeon: [name], MD;  [facility];  Service: Endoscopy;  Laterality: N/A;  12:00  . ESOPHAGOGASTRODUODENOSCOPY N/A 08/17/2017   Procedure: ESOPHAGOGASTRODUODENOSCOPY (EGD);  Surgeon: [name], MD;  [facility];  Service: Endoscopy;  Laterality: N/A;  8:15  . ESOPHAGOGASTRODUODENOSCOPY N/A 09/12/2017   Procedure:  ESOPHAGOGASTRODUODENOSCOPY (EGD);  Surgeon: [name], MD;  [facility];  Service: Endoscopy;  Laterality: N/A;  1040  . ESOPHAGOGASTRODUODENOSCOPY N/A 10/10/2017   Procedure: ESOPHAGOGASTRODUODENOSCOPY (EGD);  Surgeon: [name], MD;  [facility];  Service: Endoscopy;  Laterality: N/A;  225  . ESOPHAGOGASTRODUODENOSCOPY N/A 11/09/2017   Procedure: ESOPHAGOGASTRODUODENOSCOPY (EGD);  Surgeon: [name], MD;  [facility];  Service: Endoscopy;  Laterality: N/A;  . ESOPHAGOGASTRODUODENOSCOPY (EGD) WITH PROPOFOL  09/24/2012   Procedure: ESOPHAGOGASTRODUODENOSCOPY (EGD) WITH PROPOFOL;  Surgeon: [name], MD;  Location: [facility];  Service: Endoscopy;  Laterality: N/A;  GE junction at 36  . ESOPHAGOGASTRODUODENOSCOPY (EGD) WITH PROPOFOL N/A 07/06/2017   Procedure: ESOPHAGOGASTRODUODENOSCOPY (EGD) WITH PROPOFOL;  Surgeon: [name], MD;  [facility];  Service: Endoscopy;  Laterality: N/A;  . ESOPHAGOGASTRODUODENOSCOPY W/ BANDING  08/2010  . GIVENS CAPSULE STUDY N/A 07/05/2017   Procedure: GIVENS CAPSULE STUDY;  Surgeon: [name], MD;  [facility];  Service: Endoscopy;  Laterality: N/A;  . HOT HEMOSTASIS N/A 08/17/2017   Procedure: HOT HEMOSTASIS (ARGON PLASMA COAGULATION/BICAP);  Surgeon: [name], MD;  [facility];  Service: Endoscopy;  Laterality: N/A;  . HOT HEMOSTASIS  09/12/2017   Procedure: HOT HEMOSTASIS (ARGON PLASMA COAGULATION/BICAP);  Surgeon: [name], MD;  [facility];  Service: Endoscopy;;  gastric  . HOT HEMOSTASIS  10/10/2017   Procedure: HOT HEMOSTASIS (ARGON PLASMA COAGULATION/BICAP);  Surgeon: [name], MD;  [facility];  Service: Endoscopy;;  . POLYPECTOMY  03/24/2016   Procedure: POLYPECTOMY;  Surgeon: [name], MD;  [facility];  Service: Endoscopy;;  sigmoid polyp  . RIGHT HEART CATH N/A 04/16/2017   Procedure:  Right Heart Cath;  Surgeon: [name], MD;  Location: [facility];  Service: Cardiovascular;  Laterality: N/A;  . RIGHT HEART CATH N/A 07/12/2017   Procedure: RIGHT HEART CATH;  Surgeon: [name], MD;  Location: [facility];  Service: Cardiovascular;  Laterality: N/A;  . TONSILLECTOMY    . UPPER GASTROINTESTINAL ENDOSCOPY  03/15/2011   EGD ED BANDING/TCS  . UPPER GASTROINTESTINAL ENDOSCOPY  09/05/2010  . UPPER GASTROINTESTINAL ENDOSCOPY  08/11/2010  . VAGINAL HYSTERECTOMY       OB History   No obstetric history on file.      Home Medications    Prior to Admission medications   Medication Sig Start Date End Date Taking? Authorizing Provider  busPIRone (BUSPAR) 10 MG tablet Take 10 mg by mouth 2 (two) times daily.    [provider]  cholecalciferol (VITAMIN D) 1000 UNITS tablet Take 1,000 Units by mouth daily.     [provider]  HUMALOG 100 UNIT/ML injection Inject 15-35 Units into the skin 3 (three) times daily with meals. Sliding scale insulin 07/01/18   [provider]  Insulin Glargine (LANTUS SOLOSTAR) 100 UNIT/ML Solostar Pen Inject 10-55 Units into the skin See admin instructions. Inject 55  units subcutaneously in the morning & 10 units subcutaneously at night. 03/06/19   [provider]  Insulin Pen Needle 31G X 5 MM MISC Use with each insulin injection 07/22/17   [name], [name], MD  lactulose (CHRONULAC) 10 GM/15ML solution Take 45 mLs (30 g total) by mouth 3 (three) times daily. Patient taking differently: Take 30 g by mouth 4 (four) times daily. Takes 2-4 times daily 02/27/19   Barton Dubois, MD  levothyroxine (SYNTHROID, LEVOTHROID) 25 MCG tablet Take 25 mcg by mouth daily before breakfast.     [provider]  magnesium oxide (MAG-OX) 400 MG tablet Take 400 mg by mouth daily.     [provider]  OXYGEN Inhale 2 L into the lungs at bedtime.     [provider]   pantoprazole (PROTONIX) 40 MG tablet Take 1 tablet (40 mg total) by mouth 2 (two) times daily. Patient taking differently: Take 40 mg by mouth daily.  10/09/18   [name], MD  Pediatric Multivitamins-Iron (FLINTSTONES PLUS IRON PO) Take 2 tablets by mouth daily.    [provider]  potassium chloride (K-DUR) 10 MEQ tablet Take 1 tablet (10 mEq total) by mouth 2 (two) times daily. 05/29/19   [name], MD  RELION INSULIN SYRINGE 31G X 15/64" 0.5 ML MISC USE 1 SYRINGE THREE TIMES DAILY AS DIRECTED 04/11/19   [provider]  rifaximin (XIFAXAN) 550 MG TABS tablet Take 1 tablet (550 mg total) by mouth 2 (two) times daily. 02/27/19   Barton Dubois, MD  sertraline (ZOLOFT) 50 MG tablet Take 50 mg by mouth daily.  03/10/19   [provider]  spironolactone (ALDACTONE) 50 MG tablet TAKE 1 TABLET BY MOUTH TWICE DAILY 09/16/18   Rehman, Mechele Dawley, MD  sucralfate (CARAFATE) 1 GM/10ML suspension Take 1 g by mouth 4 (four) times daily.  04/04/19 10/01/19  [provider]  torsemide (DEMADEX) 20 MG tablet Take 1 tablet by mouth once daily Patient taking differently: Take 20 mg by mouth 2 (two) times daily.  05/22/19   [name], MD  trimethoprim-polymyxin b Mayra Neer) ophthalmic solution Place 1 drop into the left eye See admin instructions. Use 3 to 4 times daily for 2 days following monthly eye injection 05/06/18   [provider]  zinc sulfate 220 (50 Zn) MG capsule Take 1 capsule (220 mg total) by mouth daily. 08/01/18   Barton Dubois, MD    Family History Family History  Problem Relation Age of Onset  . Lung cancer Mother   . Diabetes Father   . Diabetes Sister   . Hypertension Sister   . Hypothyroidism Sister   . Colon cancer Brother   . Diabetes Sister   . Hypothyroidism Brother   . Healthy Daughter   . Obesity Daughter   . Hypertension Daughter     Social History Social History   Tobacco Use  . Smoking status: Never Smoker  .  Smokeless tobacco: Never Used  Substance Use Topics  . Alcohol use: No    Alcohol/week: 0.0 standard drinks  . Drug use: No     Allergies   Ferumoxytol and Tramadol hcl   Review of Systems Review of Systems  Constitutional: Positive for fatigue. Negative for activity change and appetite change.  HENT: Negative for congestion, ear discharge, ear pain, facial swelling, nosebleeds, rhinorrhea, sneezing and tinnitus.   Eyes: Negative for photophobia, pain and discharge.  Respiratory: Negative for cough, choking, shortness of breath  and wheezing.   Cardiovascular: Negative for chest pain, palpitations and leg swelling.  Gastrointestinal: Negative for abdominal pain, blood in stool, constipation, diarrhea, nausea and vomiting.  Genitourinary: Negative for difficulty urinating, dysuria, flank pain, frequency and hematuria.  Musculoskeletal: Negative for back pain, gait problem, myalgias and neck pain.  Skin: Negative for color change, rash and wound.  Neurological: Positive for weakness. Negative for dizziness, seizures, syncope, facial asymmetry, speech difficulty and numbness.  Hematological: Negative for adenopathy. Does not bruise/bleed easily.  Psychiatric/Behavioral: Negative for agitation, confusion, hallucinations, self-injury and suicidal ideas. The patient is not nervous/anxious.      Physical Exam Updated Vital Signs Wt 95.3 kg   BMI 36.05 kg/m   Physical Exam Vitals signs and nursing note reviewed.  Constitutional:      Appearance: She is well-developed. She is not toxic-appearing.  HENT:     Head: Normocephalic.     Comments: The posterior soft palate is jaundiced.    Right Ear: Tympanic membrane and external ear normal.     Left Ear: Tympanic membrane and external ear normal.  Eyes:     General: Lids are normal. Scleral icterus present.     Conjunctiva/sclera:     Left eye: Left conjunctiva is injected.     Pupils: Pupils are equal, round, and reactive to light.   Neck:     Musculoskeletal: Normal range of motion and neck supple.     Vascular: No carotid bruit.  Cardiovascular:     Rate and Rhythm: Normal rate and regular rhythm.     Pulses: Normal pulses.     Heart sounds: Murmur present. Systolic murmur present with a grade of 2/6.  Pulmonary:     Effort: Tachypnea present. No respiratory distress.     Breath sounds: Normal breath sounds.  Abdominal:     General: Bowel sounds are normal.     Palpations: Abdomen is soft.     Tenderness: There is no abdominal tenderness. There is no guarding.  Musculoskeletal: Normal range of motion.     Right lower leg: She exhibits tenderness. Edema present.     Left lower leg: She exhibits tenderness. Edema present.     Comments: There is edema of the right and left lower extremity.  Lymphadenopathy:     Head:     Right side of head: No submandibular adenopathy.     Left side of head: No submandibular adenopathy.     Cervical: No cervical adenopathy.  Skin:    General: Skin is warm and dry.  Neurological:     Mental Status: She is alert and oriented to person, place, and time.     Cranial Nerves: No cranial nerve deficit.     Sensory: No sensory deficit.  Psychiatric:        Speech: Speech normal.      ED Treatments / Results  Labs (all labs ordered are listed, but only abnormal results are displayed) Labs Reviewed - No data to display  EKG None  Radiology No results found.  Procedures Procedures (including critical care time) CRITICAL CARE Performed by: Lily Kocher Total critical care time: *30** minutes Critical care time was exclusive of separately billable procedures and treating other patients. Critical care was necessary to treat or prevent imminent or life-threatening deterioration. Critical care was time spent personally by me on the following activities: development of treatment plan with patient and/or surrogate as well as nursing, discussions with consultants, evaluation  of patient's response to treatment, examination of patient,  obtaining history from patient or surrogate, ordering and performing treatments and interventions, ordering and review of laboratory studies, ordering and review of radiographic studies, pulse oximetry and re-evaluation of patient's condition.  Medications Ordered in ED Medications - No data to display   Initial Impression / Assessment and Plan / ED Course  I have reviewed the triage vital signs and the nursing notes.  Pertinent labs & imaging results that were available during my care of the patient were reviewed by me and considered in my medical decision making (see chart for details).          Final Clinical Impressions(s) / ED Diagnoses MDM  Vital signs reviewed.  Pulse oximetry is 100% on room air.  Patient noted by the GI specialist to have low trending hemoglobin and hematocrit.  This is a reoccurring issue for this patient, as she has blood loss anemia.  The patient is also nonalcoholic cirrhosis patient, with history of hepatic failure.  Complete blood count shows the white blood cells to be 4200.  The red cells are low at 2.26.  The hemoglobin is low at 5.9, and the hematocrit is low at 21.  The platelets are low at 71.  The patient has been typed and crossed for packed cells and request for transfusion is been made.  Blood pressure remained stable.  Comprehensive metabolic panel shows the CO2 to be slightly low at 21, the glucose elevated at 170.  The sodium, potassium, and chloride are all normal.  Total bili is elevated at 2.9.  The anion gap is normal at 8.  Blood pressure remains stable. Pt awake and alert.  Urine analysis shows a hazy amber specimen with a specific gravity of 1.019. Nitrates, and leukocyte esterase are negative.  The GI specialist has requested the patient be admitted to the hospital, patient transfused, and patient seen by surgical consultant for additional procedure. Case will be  discussed with the triad hospitalist For admission.   Final diagnoses:  Symptomatic anemia  [facility])    ED Discharge Orders    None       Lily Kocher, PA-C 06/01/19 0035    Fredia Sorrow, MD 06/09/19 [phone]

## 2019-05-31 NOTE — ED Triage Notes (Signed)
Pt C/O increased weakness that began to get worse last week. Pt states Dr. Laural Golden instructed her to come to ER due to low hemoglobin and low potassium.

## 2019-05-31 NOTE — ED Notes (Signed)
Date and time results received: 05/31/19 10:51 PM  (use smartphrase ".now" to insert current time)  Test: hgb Critical Value: 5.9  Name of Provider Notified: [name], MD  Orders Received? Or Actions Taken?: na

## 2019-05-31 NOTE — ED Notes (Signed)
ED Provider at bedside. 

## 2019-05-31 NOTE — ED Provider Notes (Signed)
Medical screening examination/treatment/procedure(s) were conducted as a shared visit with non-physician practitioner(s) and myself.  I personally evaluated the patient during the encounter.  EKG Interpretation  Date/Time:  Saturday May 31 2019 21:08:32 EDT Ventricular Rate:  80 PR Interval:    QRS Duration: 105 QT Interval:  395 QTC Calculation: 456 R Axis:   -55 Text Interpretation:  Sinus rhythm Prolonged PR interval Left anterior fascicular block Consider anterior infarct No significant change since last tracing Confirmed by Fredia Sorrow [phone]) on 05/31/2019 9:39:26 PM   Patient seen by me along with physician assistant.  Patient's gastroenterologist locally is Dr. Melony Overly.  Patient has had hemoglobin in the low 6 range few days ago.  Patient has a history of liver failure being considered for liver transplant.  Patient is also trouble with bleeding from the stomach.  But has not had any bleeding recently.  Dr. Melony Overly wants her admitted for blood transfusion if we confirm that the hemoglobin is below 7.  Also Dr. Arnoldo Morale was planning to do Port-A-Cath early this week.  Patient was planned for blood transfusion on Wednesday of this week.  Perhaps Dr. Arnoldo Morale will be able do the Port-A-Cath while she is in the hospital as well.  Patient nontoxic no acute distress.  Patient is jaundice.   Fredia Sorrow, MD 05/31/19 2149

## 2019-05-31 NOTE — ED Notes (Signed)
CRITICAL VALUE ALERT  Critical Value:  Hgb 5.9  Date & Time Notied:  05/31/2019 Provider Notified:[place] Orders Received/Actions taken: none yet

## 2019-05-31 NOTE — ED Notes (Signed)
Unable to obtain IV placement at this time.

## 2019-06-01 DIAGNOSIS — D649 Anemia, unspecified: Secondary | ICD-10-CM | POA: Diagnosis not present

## 2019-06-01 DIAGNOSIS — K7460 Unspecified cirrhosis of liver: Secondary | ICD-10-CM | POA: Diagnosis not present

## 2019-06-01 DIAGNOSIS — E039 Hypothyroidism, unspecified: Secondary | ICD-10-CM

## 2019-06-01 DIAGNOSIS — I8500 Esophageal varices without bleeding: Secondary | ICD-10-CM

## 2019-06-01 DIAGNOSIS — K31819 Angiodysplasia of stomach and duodenum without bleeding: Secondary | ICD-10-CM | POA: Diagnosis not present

## 2019-06-01 DIAGNOSIS — E119 Type 2 diabetes mellitus without complications: Secondary | ICD-10-CM

## 2019-06-01 DIAGNOSIS — Z794 Long term (current) use of insulin: Secondary | ICD-10-CM

## 2019-06-01 LAB — COMPREHENSIVE METABOLIC PANEL
ALT: 25 U/L (ref 0–44)
AST: 32 U/L (ref 15–41)
Albumin: 1.8 g/dL — ABNORMAL LOW (ref 3.5–5.0)
Alkaline Phosphatase: 75 U/L (ref 38–126)
Anion gap: 6 (ref 5–15)
BUN: 19 mg/dL (ref 8–23)
CO2: 19 mmol/L — ABNORMAL LOW (ref 22–32)
Calcium: 7.1 mg/dL — ABNORMAL LOW (ref 8.9–10.3)
Chloride: 112 mmol/L — ABNORMAL HIGH (ref 98–111)
Creatinine, Ser: 0.89 mg/dL (ref 0.44–1.00)
GFR calc Af Amer: >60 mL/min (ref >60)
GFR calc non Af Amer: >60 mL/min (ref >60)
Glucose, Bld: 185 mg/dL — ABNORMAL HIGH (ref 70–99)
Potassium: 3.5 mmol/L (ref 3.5–5.1)
Sodium: 137 mmol/L (ref 135–145)
Total Bilirubin: 2.4 mg/dL — ABNORMAL HIGH (ref 0.3–1.2)
Total Protein: 4.1 g/dL — ABNORMAL LOW (ref 6.5–8.1)

## 2019-06-01 LAB — HEMOGLOBIN AND HEMATOCRIT, BLOOD
HCT: 24.9 % — ABNORMAL LOW (ref 36.0–46.0)
Hemoglobin: 7.4 g/dL — ABNORMAL LOW (ref 12.0–15.0)

## 2019-06-01 LAB — GLUCOSE, CAPILLARY
Glucose-Capillary: 109 mg/dL — ABNORMAL HIGH (ref 70–99)
Glucose-Capillary: 180 mg/dL — ABNORMAL HIGH (ref 70–99)
Glucose-Capillary: 223 mg/dL — ABNORMAL HIGH (ref 70–99)
Glucose-Capillary: 193 mg/dL — ABNORMAL HIGH (ref 70–99)
Glucose-Capillary: 208 mg/dL — ABNORMAL HIGH (ref 70–99)

## 2019-06-01 LAB — CBC
HCT: 19 % — ABNORMAL LOW (ref 36.0–46.0)
Hemoglobin: 5.4 g/dL — CL (ref 12.0–15.0)
MCH: 26 pg (ref 26.0–34.0)
MCHC: 28.4 g/dL — ABNORMAL LOW (ref 30.0–36.0)
MCV: 91.3 fL (ref 80.0–100.0)
Platelets: 47 10*3/uL — ABNORMAL LOW (ref 150–400)
RBC: 2.08 MIL/uL — ABNORMAL LOW (ref 3.87–5.11)
RDW: 17.8 % — ABNORMAL HIGH (ref 11.5–15.5)
WBC: 2.7 10*3/uL — ABNORMAL LOW (ref 4.0–10.5)
nRBC: 0 % (ref 0.0–0.2)

## 2019-06-01 LAB — PREPARE RBC (CROSSMATCH)

## 2019-06-01 LAB — SARS CORONAVIRUS 2 BY RT PCR (HOSPITAL ORDER, PERFORMED IN ~~LOC~~ HOSPITAL LAB): SARS Coronavirus 2: NEGATIVE

## 2019-06-01 MED ORDER — LACTULOSE 10 GM/15ML PO SOLN
30.0000 g | Freq: Four times a day (QID) | ORAL | Status: DC
  Administered 2019-06-01 – 2019-06-02 (×6): 30 g via ORAL
  Filled 2019-06-01 (×6): qty 60

## 2019-06-01 MED ORDER — ACETAMINOPHEN 325 MG PO TABS
650.0000 mg | ORAL_TABLET | Freq: Four times a day (QID) | ORAL | Status: DC | PRN
  Administered 2019-06-02: 650 mg via ORAL
  Filled 2019-06-01: qty 2

## 2019-06-01 MED ORDER — SODIUM CHLORIDE 0.9% FLUSH
3.0000 mL | Freq: Two times a day (BID) | INTRAVENOUS | Status: DC
  Administered 2019-06-01 – 2019-06-02 (×2): 3 mL via INTRAVENOUS

## 2019-06-01 MED ORDER — CHLORHEXIDINE GLUCONATE CLOTH 2 % EX PADS: 6.0000 | MEDICATED_PAD | Freq: Once | CUTANEOUS | Status: DC

## 2019-06-01 MED ORDER — SERTRALINE HCL 50 MG PO TABS
50.0000 mg | ORAL_TABLET | Freq: Every day | ORAL | Status: DC
  Administered 2019-06-01 – 2019-06-02 (×2): 50 mg via ORAL
  Filled 2019-06-01 (×2): qty 1

## 2019-06-01 MED ORDER — POTASSIUM CHLORIDE CRYS ER 10 MEQ PO TBCR
20.0000 meq | EXTENDED_RELEASE_TABLET | Freq: Every day | ORAL | Status: DC
  Administered 2019-06-01 – 2019-06-02 (×2): 20 meq via ORAL
  Filled 2019-06-01 (×4): qty 2

## 2019-06-01 MED ORDER — INSULIN ASPART 100 UNIT/ML ~~LOC~~ SOLN
0.0000 [IU] | Freq: Three times a day (TID) | SUBCUTANEOUS | Status: DC
  Administered 2019-06-01: 3 [IU] via SUBCUTANEOUS
  Administered 2019-06-01 – 2019-06-02 (×3): 2 [IU] via SUBCUTANEOUS

## 2019-06-01 MED ORDER — PANTOPRAZOLE SODIUM 40 MG PO TBEC
40.0000 mg | DELAYED_RELEASE_TABLET | Freq: Every day | ORAL | Status: DC
  Administered 2019-06-01: 40 mg via ORAL
  Filled 2019-06-01: qty 1

## 2019-06-01 MED ORDER — INSULIN GLARGINE 100 UNIT/ML ~~LOC~~ SOLN
10.0000 [IU] | Freq: Every day | SUBCUTANEOUS | Status: DC
  Filled 2019-06-01: qty 0.1

## 2019-06-01 MED ORDER — INSULIN GLARGINE 100 UNIT/ML ~~LOC~~ SOLN
55.0000 [IU] | Freq: Every morning | SUBCUTANEOUS | Status: DC
  Administered 2019-06-01: 12:00:00 55 [IU] via SUBCUTANEOUS
  Filled 2019-06-01 (×3): qty 0.55

## 2019-06-01 MED ORDER — SUCRALFATE 1 GM/10ML PO SUSP
1.0000 g | Freq: Four times a day (QID) | ORAL | Status: DC
  Administered 2019-06-01 – 2019-06-02 (×7): 1 g via ORAL
  Filled 2019-06-01 (×7): qty 10

## 2019-06-01 MED ORDER — ACETAMINOPHEN 650 MG RE SUPP: 650.0000 mg | Freq: Four times a day (QID) | RECTAL | Status: DC | PRN

## 2019-06-01 MED ORDER — PANTOPRAZOLE SODIUM 40 MG PO TBEC
40.0000 mg | DELAYED_RELEASE_TABLET | Freq: Two times a day (BID) | ORAL | Status: DC
  Administered 2019-06-01 – 2019-06-02 (×3): 40 mg via ORAL
  Filled 2019-06-01 (×4): qty 1

## 2019-06-01 MED ORDER — SPIRONOLACTONE 25 MG PO TABS
50.0000 mg | ORAL_TABLET | Freq: Two times a day (BID) | ORAL | Status: DC
  Administered 2019-06-01 – 2019-06-02 (×3): 50 mg via ORAL
  Filled 2019-06-01 (×3): qty 1
  Filled 2019-06-01 (×2): qty 2
  Filled 2019-06-01: qty 1
  Filled 2019-06-01: qty 2

## 2019-06-01 MED ORDER — CHLORHEXIDINE GLUCONATE CLOTH 2 % EX PADS
6.0000 | MEDICATED_PAD | Freq: Once | CUTANEOUS | Status: AC
  Administered 2019-06-01: 6 via TOPICAL

## 2019-06-01 MED ORDER — SODIUM CHLORIDE 0.9% IV SOLUTION: Freq: Once | INTRAVENOUS | Status: DC

## 2019-06-01 MED ORDER — ZINC SULFATE 220 (50 ZN) MG PO CAPS
220.0000 mg | ORAL_CAPSULE | Freq: Every day | ORAL | Status: DC
  Administered 2019-06-01 – 2019-06-02 (×2): 220 mg via ORAL
  Filled 2019-06-01 (×2): qty 1

## 2019-06-01 MED ORDER — BUSPIRONE HCL 5 MG PO TABS
10.0000 mg | ORAL_TABLET | Freq: Two times a day (BID) | ORAL | Status: DC
  Administered 2019-06-01 – 2019-06-02 (×3): 10 mg via ORAL
  Filled 2019-06-01 (×3): qty 2

## 2019-06-01 MED ORDER — PRO-STAT SUGAR FREE PO LIQD
30.0000 mL | Freq: Two times a day (BID) | ORAL | Status: DC
  Administered 2019-06-01 – 2019-06-02 (×2): 30 mL via ORAL
  Filled 2019-06-01 (×3): qty 30

## 2019-06-01 MED ORDER — MAGNESIUM OXIDE 400 (241.3 MG) MG PO TABS
400.0000 mg | ORAL_TABLET | Freq: Every day | ORAL | Status: DC
  Administered 2019-06-01 – 2019-06-02 (×2): 400 mg via ORAL
  Filled 2019-06-01 (×2): qty 1

## 2019-06-01 MED ORDER — INSULIN GLARGINE 100 UNIT/ML ~~LOC~~ SOLN
55.0000 [IU] | Freq: Every morning | SUBCUTANEOUS | Status: DC
  Filled 2019-06-01 (×2): qty 0.55

## 2019-06-01 MED ORDER — INSULIN ASPART 100 UNIT/ML ~~LOC~~ SOLN
0.0000 [IU] | Freq: Every day | SUBCUTANEOUS | Status: DC
  Administered 2019-06-01: 2 [IU] via SUBCUTANEOUS

## 2019-06-01 MED ORDER — RIFAXIMIN 550 MG PO TABS
550.0000 mg | ORAL_TABLET | Freq: Two times a day (BID) | ORAL | Status: DC
  Administered 2019-06-01 – 2019-06-02 (×3): 550 mg via ORAL
  Filled 2019-06-01 (×3): qty 1

## 2019-06-01 MED ORDER — FUROSEMIDE 10 MG/ML IJ SOLN
20.0000 mg | Freq: Once | INTRAMUSCULAR | Status: AC
  Administered 2019-06-01: 20 mg via INTRAVENOUS

## 2019-06-01 MED ORDER — LEVOTHYROXINE SODIUM 25 MCG PO TABS
25.0000 ug | ORAL_TABLET | Freq: Every day | ORAL | Status: DC
  Administered 2019-06-01: 25 ug via ORAL
  Filled 2019-06-01 (×2): qty 1

## 2019-06-01 MED ORDER — FUROSEMIDE 10 MG/ML IJ SOLN
20.0000 mg | Freq: Two times a day (BID) | INTRAMUSCULAR | Status: DC
  Administered 2019-06-01 – 2019-06-02 (×3): 20 mg via INTRAVENOUS
  Filled 2019-06-01 (×4): qty 2

## 2019-06-01 MED ORDER — FUROSEMIDE 10 MG/ML IJ SOLN
20.0000 mg | Freq: Once | INTRAMUSCULAR | Status: DC
  Filled 2019-06-01: qty 2

## 2019-06-01 MED ORDER — SODIUM CHLORIDE 0.9 % IV SOLN: 250.0000 mL | INTRAVENOUS | Status: DC | PRN

## 2019-06-01 MED ORDER — SODIUM CHLORIDE 0.9% FLUSH: 3.0000 mL | INTRAVENOUS | Status: DC | PRN

## 2019-06-01 MED ORDER — VITAMIN D 25 MCG (1000 UNIT) PO TABS
1000.0000 [IU] | ORAL_TABLET | Freq: Every day | ORAL | Status: DC
  Administered 2019-06-01 – 2019-06-02 (×2): 1000 [IU] via ORAL
  Filled 2019-06-01 (×2): qty 1

## 2019-06-01 NOTE — Progress Notes (Signed)
Patient Demographics:    Maria [name], is a 63 y.o. female, DOB - August 03, 1956, MVV:[id number]  Admit date - 05/31/2019   Admitting Physician Jani Gravel, MD  Outpatient Primary MD for the patient is Maria Chroman, MD  LOS - 0   Chief Complaint  Patient presents with  . Weakness        Subjective:    Maria [name] today has no fevers, no emesis,  No chest pain,   - Patient reports dizziness, fatigue and dyspnea on exertion--had near syncopal episode when she walked to the bathroom, I have advised her to use a bedpan and bedside commode only for now  Assessment  & Plan :    Principal Problem:   Symptomatic anemia Active Problems:   Esophageal varices (HCC)   Esophageal varices in cirrhosis (HCC)   Hepatic cirrhosis (HCC)   GAVE (gastric antral vascular ectasia)   Hypothyroidism   Insulin dependent diabetes mellitus ([place])  Brief Summary:- 63 y.o. female,  w Hypothyroidism, Depression, Fibromyalgia, h/o eye hemorrhage, Hypertension, Hyperlipidemia, Dm2, CHF NASH cirrhosis w esophageal varices, GAVE, s/p recent EGD 05/23/2019 at [facility] by Leafy Half tx with APC   admitted on 06/01/2019 with symptomatic acute on chronic anemia   A/p  1) symptomatic acute on chronic anemia--- due to chronic GI blood loss, patient with longstanding history of GI bleed secondary to GAVE/esophageal varices in the setting of [place] related liver cirrhosis -Patient hemoglobin is usually close to 8,, admitted 06/01/2019 with hemoglobin of 5.9 associated with shortness of breath, fatigue and dizziness as well as dyspnea on exertion--patient received 1 unit of packed cells -Posttransfusion hemoglobin down to 5.4 from 5.9 -Patient remains with dizziness, fatigue and dyspnea on exertion, patient had near syncopal episode when she ambulated to the bathroom -We will transfuse additional 2 units of packed cells -Change Protonix to  40 twice daily, continue Carafate  2) difficult IV access--given limited IV access and need for frequent transfusions, appreciate surgical input and plan for Port-A-Cath placement on 06/02/2019  3)DM--- no recent A1c available, continue Lantus insulin 10 units every afternoon and 55 units every morning--however given the patient will be n.p.o. after midnight for Port-A-Cath placement on 06/02/2019 will hold p.m. Lantus dose Use Novolog/Humalog Sliding scale insulin with Accu-Cheks/Fingersticks as ordered  4) [place] related liver cirrhosis--continue lactulose and rifaximin, continue Aldactone and add low-dose atenolol   5) hypothyroidism--stable continue levothyroxine 25 mcg daily  6) depression and anxiety--- stable, continue Zoloft 50 mg daily and buspirone  Disposition/Need for in-Hospital Stay- patient unable to be discharged at this time due to symptomatic anemia- dizziness, fatigue and dyspnea on exertion requiring further transfusion of PRBCs  Code Status : Full  Family Communication:   NA (patient is alert, awake and coherent)   Disposition Plan  : Home in 1 to 2 days  Consults  : General surgeon for Port-A-Cath placement  DVT Prophylaxis  :    SCDs (GI bleed)  Lab Results  Component Value Date   PLT 47 (L) 06/01/2019    Inpatient Medications  Scheduled Meds: . busPIRone  10 mg Oral BID  . cholecalciferol  1,000 Units Oral Daily  . feeding supplement (PRO-STAT SUGAR FREE 64)  30 mL Oral BID  . furosemide  20 mg Intravenous BID  . insulin aspart  0-5 Units Subcutaneous QHS  . insulin aspart  0-9 Units Subcutaneous TID WC  . insulin glargine  10 Units Subcutaneous QHS  . insulin glargine  55 Units Subcutaneous q morning - 10a  . lactulose  30 g Oral QID  . levothyroxine  25 mcg Oral QAC breakfast  . magnesium oxide  400 mg Oral Daily  . pantoprazole  40 mg Oral Daily  . potassium chloride  20 mEq Oral Daily  . rifaximin  550 mg Oral BID  . sertraline  50 mg Oral Daily   . sodium chloride flush  3 mL Intravenous Q12H  . spironolactone  50 mg Oral BID  . sucralfate  1 g Oral QID  . zinc sulfate  220 mg Oral Daily   Continuous Infusions: . sodium chloride     PRN Meds:.sodium chloride, acetaminophen **OR** acetaminophen, sodium chloride flush    Anti-infectives (From admission, onward)   Start     Dose/Rate Route Frequency Ordered Stop   06/01/19 0215  rifaximin (XIFAXAN) tablet 550 mg     550 mg Oral 2 times daily 06/01/19 0210          Objective:   Vitals:   06/01/19 0438 06/01/19 0735 06/01/19 0738 06/01/19 1038  BP: (!) 123/39  (!) 133/37 (!) 117/48  Pulse: 88  85 85  Resp: 18  16 16   Temp: 98 F (36.7 C) 98.9 F (37.2 C)  98.4 F (36.9 C)  TempSrc: Oral Oral  Oral  SpO2: 97%  98% 100%  Weight:      Height:        Wt Readings from Last 3 Encounters:  06/01/19 94.4 kg  05/16/19 97.3 kg  05/15/19 97.1 kg     Intake/Output Summary (Last 24 hours) at 06/01/2019 1102 Last data filed at 06/01/2019 0735 Gross per 24 hour  Intake 396 ml  Output -  Net 396 ml     Physical Exam  Gen:- Awake Alert,  In no apparent distress, fatigued HEENT:- [place].AT, No sclera icterus Neck-Supple Neck,No JVD,.  Lungs-  CTAB , fair symmetrical air movement CV- S1, S2 normal, regular, dizziness and dyspnea on exertion Abd-  +ve B.Sounds, Abd Soft, No tenderness,    Extremity/Skin:- No  edema, pedal pulses present  Psych-affect is appropriate, oriented x3 Neuro-no new focal deficits, no tremors   Data Review:   Micro Results Recent Results (from the past 240 hour(s))  SARS Coronavirus 2 [facility] order, Performed in [facility] hospital lab) Nasopharyngeal Nasopharyngeal Swab     Status: None   Collection Time: 06/01/19  1:34 AM   Specimen: Nasopharyngeal Swab  Result Value Ref Range Status   SARS Coronavirus 2 NEGATIVE NEGATIVE Final    Comment: (NOTE) If result is NEGATIVE SARS-CoV-2 target nucleic acids are NOT DETECTED. The SARS-CoV-2  RNA is generally detectable in upper and lower  respiratory specimens during the acute phase of infection. The lowest  concentration of SARS-CoV-2 viral copies this assay can detect is 250  copies / mL. A negative result does not preclude SARS-CoV-2 infection  and should not be used as the sole basis for treatment or other  patient management decisions.  A negative result may occur with  improper specimen collection / handling, submission of specimen other  than nasopharyngeal swab, presence of viral mutation(s) within the  areas targeted by this assay, and inadequate number of viral copies  (<250 copies / mL). A negative result  must be combined with clinical  observations, patient history, and epidemiological information. If result is POSITIVE SARS-CoV-2 target nucleic acids are DETECTED. The SARS-CoV-2 RNA is generally detectable in upper and lower  respiratory specimens dur ing the acute phase of infection.  Positive  results are indicative of active infection with SARS-CoV-2.  Clinical  correlation with patient history and other diagnostic information is  necessary to determine patient infection status.  Positive results do  not rule out bacterial infection or co-infection with other viruses. If result is PRESUMPTIVE POSTIVE SARS-CoV-2 nucleic acids MAY BE PRESENT.   A presumptive positive result was obtained on the submitted specimen  and confirmed on repeat testing.  While 2019 novel coronavirus  (SARS-CoV-2) nucleic acids may be present in the submitted sample  additional confirmatory testing may be necessary for epidemiological  and / or clinical management purposes  to differentiate between  SARS-CoV-2 and other Sarbecovirus currently known to infect humans.  If clinically indicated additional testing with an alternate test  methodology [phone]) is advised. The SARS-CoV-2 RNA is generally  detectable in upper and lower respiratory sp ecimens during the acute  phase of  infection. The expected result is Negative. Fact Sheet for Patients:  StrictlyIdeas.no Fact Sheet for Healthcare Providers: BankingDealers.co.za This test is not yet approved or cleared by the [place] FDA and has been authorized for detection and/or diagnosis of SARS-CoV-2 by FDA under an Emergency Use Authorization (EUA).  This EUA will remain in effect (meaning this test can be used) for the duration of the COVID-19 declaration under Section 564(b)(1) of the Act, 21 U.S.C. section 360bbb-3(b)(1), unless the authorization is terminated or revoked sooner. Performed at [facility], [address] [place], [place] [address]     Radiology Reports No results found.   CBC Recent Labs  Lab 05/28/19 0903 05/31/19 2224 06/01/19 0945  WBC  --  4.2 2.7*  HGB 7.8* 5.9* 5.4*  HCT 27.6* 21.2* 19.0*  PLT  --  71* 47*  MCV  --  93.8 91.3  MCH  --  26.1 26.0  MCHC  --  27.8* 28.4*  RDW  --  19.2* 17.8*  LYMPHSABS  --  1.1  --   MONOABS  --  0.5  --   EOSABS  --  0.1  --   BASOSABS  --  0.0  --     Chemistries  Recent Labs  Lab 05/28/19 0903 05/31/19 2142 05/31/19 2224 06/01/19 0945  NA 136 137 138 137  K 2.5* 4.7 4.1 3.5  CL 100 109 109 112*  CO2 25 19* 21* 19*  GLUCOSE 116* 196* 170* 185*  BUN 33* 22 21 19   CREATININE 1.35* 0.89 0.96 0.89  CALCIUM 8.0* 8.4* 8.4* 7.1*  AST 38 55* 41 32  ALT 32 31 31 25   ALKPHOS 103 86 92 75  BILITOT 3.3* 2.8* 2.9* 2.4*   ------------------------------------------------------------------------------------------------------------------ No results for input(s): CHOL, HDL, LDLCALC, TRIG, CHOLHDL, LDLDIRECT in the last 72 hours.  Lab Results  Component Value Date   HGBA1C 4.2 (L) 07/18/2017   ------------------------------------------------------------------------------------------------------------------ No results for input(s): TSH, T4TOTAL, T3FREE, THYROIDAB in the last 72 hours.   Invalid input(s): FREET3 ------------------------------------------------------------------------------------------------------------------ No results for input(s): VITAMINB12, FOLATE, FERRITIN, TIBC, IRON, RETICCTPCT in the last 72 hours.  Coagulation profile No results for input(s): INR, PROTIME in the last 168 hours.  No results for input(s): DDIMER in the last 72 hours.  Cardiac Enzymes No results for input(s): CKMB, TROPONINI, MYOGLOBIN in the  last 168 hours.  Invalid input(s): CK ------------------------------------------------------------------------------------------------------------------    Component Value Date/Time   BNP 20.0 07/02/2018 1736     Keilly Fatula M.D on 06/01/2019 at 11:02 AM  Go to [email or web address] - for contact info  [facility] - Office  [phone]

## 2019-06-01 NOTE — H&P (Signed)
TRH H&P    Patient Demographics:    Maria [name], is a 63 y.o. female  MRN: [id number]  DOB - [birth year]  Admit Date - 05/31/2019  Referring MD/NP/PA:     Outpatient Primary MD for the patient is Glenda Chroman, MD  Patient coming from:  home  Chief complaint- anemia   HPI:    Maria [name]  is a 63 y.o. female,  w Hypothyroidism, Depression, Fibromyalgia, h/o eye hemorrhage, Hypertension, Hyperlipidemia, Dm2, CHF NASH cirrhosis w esophageal varices, GAVE, s/p recent EGD 05/23/2019 at [facility] by Leafy Half tx with APC  presents with c/o feeling weak, and slight dyspnea on exertion. Pt denies abd pain, n/v, hematemesis, brbpr.  Pt has baseline anemia and her blood counts were checked by Dr. Laural Golden, and sent to ED for worsening anemia. Pt has portacath placement scheduled and is wondering if can have done during this admission.   In ED,  T 98.2, P 80 R 22, Bp 132/50  Pox 100% on RA Wt 95.3kg  Na 138, K 4.1, Bun 21, Creatinine 0.96 Alb 2.2 Ast 41, Alt 31, Alk phos 92, T. Bili 2.9 Glucose 196 Wbc 4.2, Hgb 5.9, Plt 71  Pt will be admitted for symptomatic anemia and also portacath placement.      Review of systems:    In addition to the HPI above,  + weight gain, + lower ext edema  No Fever-chills, No Headache, No changes with Vision or hearing, No problems swallowing food or Liquids, No Chest pain, No Cough No Abdominal pain, No Nausea or Vomiting, bowel movements are regular, No Blood in stool or Urine, No dysuria, No new skin rashes or bruises, No new joints pains-aches,  No new weakness, tingling, numbness in any extremity,  No polyuria, polydypsia or polyphagia, No significant Mental Stressors.  All other systems reviewed and are negative.    Past History of the following :    Past Medical History:  Diagnosis Date  . Anemia   . CHF (congestive heart failure) ([place])   .  Cirrhosis ([place])   . Depression   . Diabetes mellitus   . Esophageal bleed, non-variceal   . Eye hemorrhage    Behind left eye  . Fibromyalgia   . Hypercholesteremia   . Hypertension   . Hypothyroidism   . NAFLD (nonalcoholic fatty liver disease)   . Osteoarthrosis   . Osteoporosis   . PONV (postoperative nausea and vomiting)   . UTI (urinary tract infection) 11/12 end of the month   Patient feels that she passed a kidney stone at that time      Past Surgical History:  Procedure Laterality Date  . APPENDECTOMY  1980  . BILATERAL SALPINGOOPHORECTOMY    . CHOLECYSTECTOMY    . COLONOSCOPY  03/15/2011  . COLONOSCOPY N/A 03/24/2016   Procedure: COLONOSCOPY;  Surgeon: [name], MD;  [facility];  Service: Endoscopy;  Laterality: N/A;  855  . ESOPHAGEAL BANDING  04/24/2012   Procedure: ESOPHAGEAL BANDING;  Surgeon: [name], MD;  [facility];  Service: Endoscopy;  Laterality: N/A;  . Esophageal BANDING  08/03/2012   [facility] in [place] , [place]  09/24/2012   Procedure: ESOPHAGEAL BANDING;  Surgeon: [name], MD;  Location: [facility];  Service: Endoscopy;  Laterality: N/A;  Banding x 3  . ESOPHAGEAL BANDING N/A 01/29/2013   Procedure: ESOPHAGEAL BANDING;  Surgeon: [name], MD;  [facility];  Service: Endoscopy;  Laterality: N/A;  . ESOPHAGEAL BANDING N/A 06/19/2014   Procedure: ESOPHAGEAL BANDING;  Surgeon: [name], MD;  [facility];  Service: Endoscopy;  Laterality: N/A;  . ESOPHAGEAL BANDING N/A 01/05/2016   Procedure: ESOPHAGEAL BANDING;  Surgeon: [name], MD;  [facility];  Service: Endoscopy;  Laterality: N/A;  . ESOPHAGEAL BANDING N/A 08/03/2016   Procedure: ESOPHAGEAL BANDING;  Surgeon: [name], MD;  [facility];  Service: Endoscopy;  Laterality: N/A;  . ESOPHAGEAL BANDING N/A 05/02/2017   Procedure: ESOPHAGEAL BANDING;  Surgeon:  [name], MD;  [facility];  Service: Endoscopy;  Laterality: N/A;  . ESOPHAGOGASTRODUODENOSCOPY  04/24/2012   Procedure: ESOPHAGOGASTRODUODENOSCOPY (EGD);  Surgeon: [name], MD;  [facility];  Service: Endoscopy;  Laterality: N/A;  300  . ESOPHAGOGASTRODUODENOSCOPY N/A 01/29/2013   Procedure: ESOPHAGOGASTRODUODENOSCOPY (EGD);  Surgeon: [name], MD;  [facility];  Service: Endoscopy;  Laterality: N/A;  1200  . ESOPHAGOGASTRODUODENOSCOPY N/A 05/22/2013   Procedure: ESOPHAGOGASTRODUODENOSCOPY (EGD);  Surgeon: [name], MD;  [facility];  Service: Endoscopy;  Laterality: N/A;  1:55  . ESOPHAGOGASTRODUODENOSCOPY N/A 12/31/2013   Procedure: ESOPHAGOGASTRODUODENOSCOPY (EGD);  Surgeon: [name], MD;  [facility];  Service: Endoscopy;  Laterality: N/A;  1200  . ESOPHAGOGASTRODUODENOSCOPY N/A 06/19/2014   Procedure: ESOPHAGOGASTRODUODENOSCOPY (EGD);  Surgeon: [name], MD;  [facility];  Service: Endoscopy;  Laterality: N/A;  1055  . ESOPHAGOGASTRODUODENOSCOPY N/A 01/15/2015   Procedure: ESOPHAGOGASTRODUODENOSCOPY (EGD);  Surgeon: [name], MD;  [facility];  Service: Endoscopy;  Laterality: N/A;  730 - moved to 9:45 - moved to 1250-Ann notified pt  . ESOPHAGOGASTRODUODENOSCOPY N/A 06/30/2015   Procedure: ESOPHAGOGASTRODUODENOSCOPY (EGD);  Surgeon: [name], MD;  [facility];  Service: Endoscopy;  Laterality: N/A;  1200  . ESOPHAGOGASTRODUODENOSCOPY N/A 01/05/2016   Procedure: ESOPHAGOGASTRODUODENOSCOPY (EGD);  Surgeon: [name], MD;  [facility];  Service: Endoscopy;  Laterality: N/A;  830  . ESOPHAGOGASTRODUODENOSCOPY N/A 08/03/2016   Procedure: ESOPHAGOGASTRODUODENOSCOPY (EGD);  Surgeon: [name], MD;  [facility];  Service: Endoscopy;  Laterality: N/A;  930  . ESOPHAGOGASTRODUODENOSCOPY N/A 05/02/2017   Procedure:  ESOPHAGOGASTRODUODENOSCOPY (EGD);  Surgeon: [name], MD;  [facility];  Service: Endoscopy;  Laterality: N/A;  12:00  . ESOPHAGOGASTRODUODENOSCOPY N/A 08/17/2017   Procedure: ESOPHAGOGASTRODUODENOSCOPY (EGD);  Surgeon: [name], MD;  [facility];  Service: Endoscopy;  Laterality: N/A;  8:15  . ESOPHAGOGASTRODUODENOSCOPY N/A 09/12/2017   Procedure: ESOPHAGOGASTRODUODENOSCOPY (EGD);  Surgeon: [name], MD;  [facility];  Service: Endoscopy;  Laterality: N/A;  1040  . ESOPHAGOGASTRODUODENOSCOPY N/A 10/10/2017   Procedure: ESOPHAGOGASTRODUODENOSCOPY (EGD);  Surgeon: [name], MD;  [facility];  Service: Endoscopy;  Laterality: N/A;  225  . ESOPHAGOGASTRODUODENOSCOPY N/A 11/09/2017   Procedure: ESOPHAGOGASTRODUODENOSCOPY (EGD);  Surgeon: Hildred Laser  U, MD;  [facility];  Service: Endoscopy;  Laterality: N/A;  . ESOPHAGOGASTRODUODENOSCOPY (EGD) WITH PROPOFOL  09/24/2012   Procedure: ESOPHAGOGASTRODUODENOSCOPY (EGD) WITH PROPOFOL;  Surgeon: [name], MD;  Location: [facility];  Service: Endoscopy;  Laterality: N/A;  GE junction at 36  . ESOPHAGOGASTRODUODENOSCOPY (EGD) WITH PROPOFOL N/A 07/06/2017   Procedure: ESOPHAGOGASTRODUODENOSCOPY (EGD) WITH PROPOFOL;  Surgeon: [name], MD;  [facility];  Service: Endoscopy;  Laterality: N/A;  . ESOPHAGOGASTRODUODENOSCOPY W/ BANDING  08/2010  . GIVENS CAPSULE STUDY N/A 07/05/2017   Procedure: GIVENS CAPSULE STUDY;  Surgeon: [name], MD;  [facility];  Service: Endoscopy;  Laterality: N/A;  . HOT HEMOSTASIS N/A 08/17/2017   Procedure: HOT HEMOSTASIS (ARGON PLASMA COAGULATION/BICAP);  Surgeon: [name], MD;  [facility];  Service: Endoscopy;  Laterality: N/A;  . HOT HEMOSTASIS  09/12/2017   Procedure: HOT HEMOSTASIS (ARGON PLASMA COAGULATION/BICAP);  Surgeon: [name], MD;  [facility];   Service: Endoscopy;;  gastric  . HOT HEMOSTASIS  10/10/2017   Procedure: HOT HEMOSTASIS (ARGON PLASMA COAGULATION/BICAP);  Surgeon: [name], MD;  [facility];  Service: Endoscopy;;  . POLYPECTOMY  03/24/2016   Procedure: POLYPECTOMY;  Surgeon: [name], MD;  [facility];  Service: Endoscopy;;  sigmoid polyp  . RIGHT HEART CATH N/A 04/16/2017   Procedure: Right Heart Cath;  Surgeon: [name], MD;  Location: [facility];  Service: Cardiovascular;  Laterality: N/A;  . RIGHT HEART CATH N/A 07/12/2017   Procedure: RIGHT HEART CATH;  Surgeon: [name], MD;  Location: [facility];  Service: Cardiovascular;  Laterality: N/A;  . TONSILLECTOMY    . UPPER GASTROINTESTINAL ENDOSCOPY  03/15/2011   EGD ED BANDING/TCS  . UPPER GASTROINTESTINAL ENDOSCOPY  09/05/2010  . UPPER GASTROINTESTINAL ENDOSCOPY  08/11/2010  . VAGINAL HYSTERECTOMY        Social History:      Social History   Tobacco Use  . Smoking status: Never Smoker  . Smokeless tobacco: Never Used  Substance Use Topics  . Alcohol use: No    Alcohol/week: 0.0 standard drinks       Family History :     Family History  Problem Relation Age of Onset  . Lung cancer Mother   . Diabetes Father   . Diabetes Sister   . Hypertension Sister   . Hypothyroidism Sister   . Colon cancer Brother   . Diabetes Sister   . Hypothyroidism Brother   . Healthy Daughter   . Obesity Daughter   . Hypertension Daughter        Home Medications:   Prior to Admission medications   Medication Sig Start Date End Date Taking? Authorizing Provider  busPIRone (BUSPAR) 10 MG tablet Take 10 mg by mouth 2 (two) times daily.   Yes [provider]  cholecalciferol (VITAMIN D) 1000 UNITS tablet Take 1,000 Units by mouth daily.    Yes [provider]  HUMALOG 100 UNIT/ML injection Inject 15-35 Units into the skin 3 (three) times daily with meals. Sliding scale insulin  100-150= 15 units 150-200= 20 units 250-300= 30 units 300-350= 35 units 07/01/18  Yes [provider]  Insulin Glargine (LANTUS SOLOSTAR) 100 UNIT/ML Solostar Pen Inject 10-55 Units into the skin See admin instructions. Inject 55 units subcutaneously in the morning & 10 units subcutaneously at night. 03/06/19  Yes [provider]  lactulose (CHRONULAC) 10 GM/15ML solution Take 45 mLs (30 g total) by mouth 3 (three) times daily. Patient taking differently: Take 30 g by mouth 4 (four) times daily. Takes 2-4 times daily 02/27/19  Yes Barton Dubois, MD  levothyroxine (SYNTHROID, LEVOTHROID) 25 MCG tablet Take 25 mcg by mouth daily before breakfast.    Yes [provider]  magnesium oxide (MAG-OX) 400 MG tablet Take 400 mg by mouth daily.    Yes [provider]  OXYGEN Inhale 2 L into the lungs at bedtime.    Yes [provider]  pantoprazole (PROTONIX) 40 MG tablet Take 1 tablet (40 mg total) by mouth 2 (two) times daily. Patient taking differently: Take 40 mg by mouth daily.  10/09/18  Yes Rehman, Mechele Dawley, MD  Pediatric Multivitamins-Iron (FLINTSTONES PLUS IRON PO) Take 2 tablets by mouth daily.   Yes [provider]  potassium chloride (K-DUR) 10 MEQ tablet Take 1 tablet (10 mEq total) by mouth 2 (two) times daily. Patient taking differently: Take 20 mEq by mouth daily.  05/29/19  Yes Rehman, Mechele Dawley, MD  rifaximin (XIFAXAN) 550 MG TABS tablet Take 1 tablet (550 mg total) by mouth 2 (two) times daily. 02/27/19  Yes Barton Dubois, MD  rosuvastatin (CRESTOR) 5 MG tablet Take 5 mg by mouth every other day.   Yes [provider]  sertraline (ZOLOFT) 50 MG tablet Take 50 mg by mouth daily.  03/10/19  Yes [provider]  sucralfate (CARAFATE) 1 GM/10ML suspension Take 1 g by mouth 4 (four) times daily.  04/04/19 10/01/19 Yes [provider]  trimethoprim-polymyxin b (POLYTRIM) ophthalmic solution Place 1 drop into the left eye  See admin instructions. Use 3 to 4 times daily for 2 days following monthly eye injection 05/06/18  Yes [provider]  zinc sulfate 220 (50 Zn) MG capsule Take 1 capsule (220 mg total) by mouth daily. 08/01/18  Yes Barton Dubois, MD  Insulin Pen Needle 31G X 5 MM MISC Use with each insulin injection Patient not taking: Reported on 05/31/2019 07/22/17   [name], [name], MD  RELION INSULIN SYRINGE 31G X 15/64" 0.5 ML MISC USE 1 SYRINGE THREE TIMES DAILY AS DIRECTED 04/11/19   [provider]  spironolactone (ALDACTONE) 50 MG tablet TAKE 1 TABLET BY MOUTH TWICE DAILY Patient taking differently: Take 50 mg by mouth 2 (two) times daily. K+ sparing diuretic: Hyperkalemia may occur with decreased renal function 09/16/18   Rehman, Mechele Dawley, MD  torsemide (DEMADEX) 20 MG tablet Take 1 tablet by mouth once daily Patient taking differently: Take 20 mg by mouth 2 (two) times daily.  05/22/19   [name], MD     Allergies:     Allergies  Allergen Reactions  . Ferumoxytol Other (See Comments)    Patient has severe back and chest pain when receiving FERAHEME infusions.   . Tramadol Hcl Other (See Comments)    WEAKNESS      Physical Exam:   Vitals  Blood pressure (!) 134/42, pulse 78, temperature 98.4 F (36.9 C), resp. rate 16, height 5' 4"  (1.626 m), weight 94.4 kg, SpO2 100 %.  1.  General: axoxo3  2. Psychiatric: euthymic  3. Neurologic: nonfocal  4. HEENMT:  Icteric, pale conjunctiva, pupils 1.47m symmetric, direct, consensual , near intact  5. Respiratory : CTAB   6. Cardiovascular : rrr s1, s2,   7. Gastrointestinal:  Abd: soft, distended, nt,   8. Skin:  Ext: no c/c, trace  edema, skin pale  9.Musculoskeletal:  Good ROM,  No adenopathy  Heart/ lung exam per EDP  Data Review:    CBC Recent Labs  Lab 05/28/19 0903 05/31/19 2224  WBC  --  4.2  HGB 7.8* 5.9*  HCT 27.6* 21.2*  PLT  --  71*  MCV  --  93.8  MCH  --  26.1  MCHC   --  27.8*  RDW  --  19.2*  LYMPHSABS  --  1.1  MONOABS  --  0.5  EOSABS  --  0.1  BASOSABS  --  0.0   ------------------------------------------------------------------------------------------------------------------  Results for orders placed or performed during the hospital encounter of 05/31/19 (from the past 48 hour(s))  Comprehensive metabolic panel     Status: Abnormal   Collection Time: 05/31/19  9:42 PM  Result Value Ref Range   Sodium 137 135 - 145 mmol/L   Potassium 4.7 3.5 - 5.1 mmol/L   Chloride 109 98 - 111 mmol/L   CO2 19 (L) 22 - 32 mmol/L   Glucose, Bld 196 (H) 70 - 99 mg/dL   BUN 22 8 - 23 mg/dL   Creatinine, Ser 0.89 0.44 - 1.00 mg/dL   Calcium 8.4 (L) 8.9 - 10.3 mg/dL   Total Protein 4.9 (L) 6.5 - 8.1 g/dL   Albumin 2.2 (L) 3.5 - 5.0 g/dL   AST 55 (H) 15 - 41 U/L   ALT 31 0 - 44 U/L   Alkaline Phosphatase 86 38 - 126 U/L   Total Bilirubin 2.8 (H) 0.3 - 1.2 mg/dL   GFR calc non Af Amer >60 >60 mL/min   GFR calc Af Amer >60 >60 mL/min   Anion gap 9 5 - 15    Comment: Performed at [facility], [address], [place], [place] [address]  Type and screen [facility]     Status: None (Preliminary result)   Collection Time: 05/31/19  9:42 PM  Result Value Ref Range   ABO/RH(D) O NEG    Antibody Screen NEG    Sample Expiration 06/03/2019,2359    Unit Number [id number]    Blood Component Type RED CELLS,LR    Unit division 00    Status of Unit ISS'D ANOTH PT    Transfusion Status OK TO TRANSFUSE    Crossmatch Result      Compatible Performed at [facility], [address], [place], [place] [address]    Unit Number [id number]    Blood Component Type RED CELLS,LR    Unit division 00    Status of Unit ALLOCATED    Transfusion Status OK TO TRANSFUSE    Crossmatch Result Compatible   Prepare RBC     Status: None   Collection Time: 05/31/19  9:42 PM  Result Value Ref Range   Order Confirmation      ORDER PROCESSED BY BLOOD BANK Performed at  [facility], [address], [place], [place] [address]   Urinalysis, Routine w reflex microscopic     Status: Abnormal   Collection Time: 05/31/19 10:19 PM  Result Value Ref Range   Color, Urine AMBER (A) YELLOW    Comment: BIOCHEMICALS MAY BE AFFECTED BY COLOR   APPearance HAZY (A) CLEAR   Specific Gravity, Urine 1.019 1.005 - 1.030   pH 6.0 5.0 - 8.0   Glucose, UA NEGATIVE NEGATIVE mg/dL   Hgb urine dipstick NEGATIVE NEGATIVE   Bilirubin Urine NEGATIVE NEGATIVE   Ketones, ur NEGATIVE NEGATIVE mg/dL   Protein, ur  NEGATIVE NEGATIVE mg/dL   Nitrite NEGATIVE NEGATIVE   Leukocytes,Ua NEGATIVE NEGATIVE    Comment: Performed at [facility], [address], [place],  [address]  CBC with Differential     Status: Abnormal   Collection Time: 05/31/19 10:24 PM  Result Value Ref Range   WBC 4.2 4.0 - 10.5 K/uL   RBC 2.26 (L) 3.87 - 5.11 MIL/uL   Hemoglobin 5.9 (LL) 12.0 - 15.0 g/dL    Comment: This critical result has verified and been called to PRUITT,G by Duncan Dull on 09 12 2020 at 2249, and has been read back.    HCT 21.2 (L) 36.0 - 46.0 %   MCV 93.8 80.0 - 100.0 fL   MCH 26.1 26.0 - 34.0 pg   MCHC 27.8 (L) 30.0 - 36.0 g/dL   RDW 19.2 (H) 11.5 - 15.5 %   Platelets 71 (L) 150 - 400 K/uL    Comment: SPECIMEN CHECKED FOR CLOTS   nRBC 0.0 0.0 - 0.2 %   Neutrophils Relative % 60 %   Neutro Abs 2.5 1.7 - 7.7 K/uL   Lymphocytes Relative 27 %   Lymphs Abs 1.1 0.7 - 4.0 K/uL   Monocytes Relative 11 %   Monocytes Absolute 0.5 0.1 - 1.0 K/uL   Eosinophils Relative 2 %   Eosinophils Absolute 0.1 0.0 - 0.5 K/uL   Basophils Relative 0 %   Basophils Absolute 0.0 0.0 - 0.1 K/uL   Immature Granulocytes 0 %   Abs Immature Granulocytes 0.01 0.00 - 0.07 K/uL    Comment: Performed at [facility], [address] [place],  [address]  Comprehensive metabolic panel     Status: Abnormal   Collection Time: 05/31/19 10:24 PM  Result Value Ref Range   Sodium 138 135 - 145 mmol/L    Potassium 4.1 3.5 - 5.1 mmol/L   Chloride 109 98 - 111 mmol/L   CO2 21 (L) 22 - 32 mmol/L   Glucose, Bld 170 (H) 70 - 99 mg/dL   BUN 21 8 - 23 mg/dL   Creatinine, Ser 0.96 0.44 - 1.00 mg/dL   Calcium 8.4 (L) 8.9 - 10.3 mg/dL   Total Protein 4.9 (L) 6.5 - 8.1 g/dL   Albumin 2.2 (L) 3.5 - 5.0 g/dL   AST 41 15 - 41 U/L   ALT 31 0 - 44 U/L   Alkaline Phosphatase 92 38 - 126 U/L   Total Bilirubin 2.9 (H) 0.3 - 1.2 mg/dL   GFR calc non Af Amer >60 >60 mL/min   GFR calc Af Amer >60 >60 mL/min   Anion gap 8 5 - 15    Comment: Performed at [facility], [address], [place],  [address]  SARS Coronavirus 2 [facility] order, Performed in [facility] hospital lab) Nasopharyngeal Nasopharyngeal Swab     Status: None   Collection Time: 06/01/19  1:34 AM   Specimen: Nasopharyngeal Swab  Result Value Ref Range   SARS Coronavirus 2 NEGATIVE NEGATIVE    Comment: (NOTE) If result is NEGATIVE SARS-CoV-2 target nucleic acids are NOT DETECTED. The SARS-CoV-2 RNA is generally detectable in upper and lower  respiratory specimens during the acute phase of infection. The lowest  concentration of SARS-CoV-2 viral copies this assay can detect is 250  copies / mL. A negative result does not preclude SARS-CoV-2 infection  and should not be used as the sole basis for treatment or other  patient management decisions.  A negative result may occur with  improper specimen collection / handling, submission of specimen other  than nasopharyngeal swab, presence of viral mutation(s) within the  areas targeted by this assay, and inadequate number of viral copies  (<250 copies / mL). A negative result must be combined with clinical  observations, patient history, and epidemiological information. If result is POSITIVE SARS-CoV-2 target nucleic acids are DETECTED. The SARS-CoV-2 RNA is generally detectable in upper and lower  respiratory specimens dur ing the acute phase of infection.  Positive  results are  indicative of active infection with SARS-CoV-2.  Clinical  correlation with patient history and other diagnostic information is  necessary to determine patient infection status.  Positive results do  not rule out bacterial infection or co-infection with other viruses. If result is PRESUMPTIVE POSTIVE SARS-CoV-2 nucleic acids MAY BE PRESENT.   A presumptive positive result was obtained on the submitted specimen  and confirmed on repeat testing.  While 2019 novel coronavirus  (SARS-CoV-2) nucleic acids may be present in the submitted sample  additional confirmatory testing may be necessary for epidemiological  and / or clinical management purposes  to differentiate between  SARS-CoV-2 and other Sarbecovirus currently known to infect humans.  If clinically indicated additional testing with an alternate test  methodology [phone]) is advised. The SARS-CoV-2 RNA is generally  detectable in upper and lower respiratory sp ecimens during the acute  phase of infection. The expected result is Negative. Fact Sheet for Patients:  StrictlyIdeas.no Fact Sheet for Healthcare Providers: BankingDealers.co.za This test is not yet approved or cleared by the [place] FDA and has been authorized for detection and/or diagnosis of SARS-CoV-2 by FDA under an Emergency Use Authorization (EUA).  This EUA will remain in effect (meaning this test can be used) for the duration of the COVID-19 declaration under Section 564(b)(1) of the Act, 21 U.S.C. section 360bbb-3(b)(1), unless the authorization is terminated or revoked sooner. Performed at [facility], [address], [place], [place] [address]   Glucose, capillary     Status: Abnormal   Collection Time: 06/01/19  2:43 AM  Result Value Ref Range   Glucose-Capillary 109 (H) 70 - 99 mg/dL   Comment 1 Notify RN    Comment 2 Document in Chart     Chemistries  Recent Labs  Lab 05/28/19 0903 05/31/19 2142  05/31/19 2224  NA 136 137 138  K 2.5* 4.7 4.1  CL 100 109 109  CO2 25 19* 21*  GLUCOSE 116* 196* 170*  BUN 33* 22 21  CREATININE 1.35* 0.89 0.96  CALCIUM 8.0* 8.4* 8.4*  AST 38 55* 41  ALT 32 31 31  ALKPHOS 103 86 92  BILITOT 3.3* 2.8* 2.9*   ------------------------------------------------------------------------------------------------------------------  ------------------------------------------------------------------------------------------------------------------ GFR: Estimated Creatinine Clearance: 66 mL/min (by C-G formula based on SCr of 0.96 mg/dL). Liver Function Tests: Recent Labs  Lab 05/28/19 0903 05/31/19 2142 05/31/19 2224  AST 38 55* 41  ALT 32 31 31  ALKPHOS 103 86 92  BILITOT 3.3* 2.8* 2.9*  PROT 5.5* 4.9* 4.9*  ALBUMIN 2.6* 2.2* 2.2*   No results for input(s): LIPASE, AMYLASE in the last 168 hours. No results for input(s): AMMONIA in the last 168 hours. Coagulation Profile: No results for input(s): INR, PROTIME in the last 168 hours. Cardiac Enzymes: No results for input(s): CKTOTAL, CKMB, CKMBINDEX, TROPONINI in the last 168 hours. BNP (last 3 results) No results for input(s): PROBNP in the last 8760 hours. HbA1C: No results for input(s): HGBA1C in the last 72 hours. CBG: Recent Labs  Lab 06/01/19 0243  GLUCAP 109*   Lipid Profile: No results for input(s): CHOL, HDL, LDLCALC, TRIG, CHOLHDL, LDLDIRECT in the last 72 hours. Thyroid Function Tests: No results for input(s): TSH, T4TOTAL, FREET4, T3FREE, THYROIDAB in the last 72 hours. Anemia Panel: No results for input(s): VITAMINB12, FOLATE, FERRITIN, TIBC, IRON, RETICCTPCT in the last 72 hours.  --------------------------------------------------------------------------------------------------------------- Urine analysis:    Component Value Date/Time   COLORURINE AMBER (A) 05/31/2019 2219   APPEARANCEUR HAZY (A) 05/31/2019 2219   LABSPEC 1.019 05/31/2019 2219   PHURINE 6.0 05/31/2019  2219   GLUCOSEU NEGATIVE 05/31/2019 2219   HGBUR NEGATIVE 05/31/2019 2219   [place] NEGATIVE 05/31/2019 2219   [place] 05/31/2019 2219   PROTEINUR NEGATIVE 05/31/2019 2219   NITRITE NEGATIVE 05/31/2019 2219   LEUKOCYTESUR NEGATIVE 05/31/2019 2219      Imaging Results:    No results found. nsr at 58, Lad, nl int, no st-t changes c/w ischemia   Assessment & Plan:    Principal Problem:   Symptomatic anemia Active Problems:   Esophageal varices (HCC)   Esophageal varices in cirrhosis (HCC)   Hepatic cirrhosis (HCC)   GAVE (gastric antral vascular ectasia)   Hypothyroidism   Insulin dependent diabetes mellitus (HCC)  Symptomatic anemia/ Thrombocytopenia Transfuse with 2 units prbc Check cbc in am  Porta-cath placement Surgery consult placed in computer  NASH w esophageal varices and GAVE, s/p APC 05/23/2019 at [facility] by Leafy Half Cont Protonix 58m po qday Cont Sucralfate  Cont Spironolactone Lasix as below  ? Hyperammonemia Cont Lactulose qid Cont Rifaxamin Check ammonia in am  Weight gain Lasix 225miv bid (to try to take off some fluid while hospitalized) Consider 4071mv bid, if not negative I/O  H/o Hypokalemia Cont Magnesium oxide 400m25m qday  Cont Kcl  Dm2  Cont Lantus 55 units [place] qam, and 10 units [place] [facility] fsbs ac and qhs, ISS  Hypothyroidism Cont Levothyroxine  Anxiety/ Depression Cont Zoloft Cont Buspar  DVT Prophylaxis-   - SCDs   AM Labs Ordered, also please review Full Orders  Family Communication: Admission, patients condition and plan of care including tests being ordered have been discussed with the patient who indicate understanding and agree with the plan and Code Status.  Code Status:  FULL CODE per patient, husband is present with patient in ED  Admission status: Observation: Based on patients clinical presentation and evaluation of above clinical data, I have made determination that patient meets observation  criteria at this time.  Time spent in minutes : 70 minutes   JameJani Gravel on 06/01/2019 at 3:32 AM

## 2019-06-01 NOTE — Consult Note (Signed)
Reason for Consult: Anemia, GI bleed Referring Physician: Dr. Rosanna [name] is an 63 y.o. female.  HPI: Patient is a 63 year old white female who was previously scheduled to have a Port-A-Cath inserted on 06/04/2019 for anemia and the need for chronic IV access who was admitted to the hospital for anemia.  She is receiving blood transfusions at this present time.  She denies any pain.  Past Medical History:  Diagnosis Date  . Anemia   . CHF (congestive heart failure) ([place])   . Cirrhosis ([place])   . Depression   . Diabetes mellitus   . Esophageal bleed, non-variceal   . Eye hemorrhage    Behind left eye  . Fibromyalgia   . Hypercholesteremia   . Hypertension   . Hypothyroidism   . NAFLD (nonalcoholic fatty liver disease)   . Osteoarthrosis   . Osteoporosis   . PONV (postoperative nausea and vomiting)   . UTI (urinary tract infection) 11/12 end of the month   Patient feels that she passed a kidney stone at that time    Past Surgical History:  Procedure Laterality Date  . APPENDECTOMY  1980  . BILATERAL SALPINGOOPHORECTOMY    . CHOLECYSTECTOMY    . COLONOSCOPY  03/15/2011  . COLONOSCOPY N/A 03/24/2016   Procedure: COLONOSCOPY;  Surgeon: [name], MD;  [facility];  Service: Endoscopy;  Laterality: N/A;  855  . ESOPHAGEAL BANDING  04/24/2012   Procedure: ESOPHAGEAL BANDING;  Surgeon: [name], MD;  [facility];  Service: Endoscopy;  Laterality: N/A;  . Esophageal BANDING  08/03/2012   [facility] [place] [facility] [place]  09/24/2012   Procedure: ESOPHAGEAL BANDING;  Surgeon: [name], MD;  [facility]: [facility];  Service: Endoscopy;  Laterality: N/A;  Banding x 3  . ESOPHAGEAL BANDING N/A 01/29/2013   Procedure: ESOPHAGEAL BANDING;  Surgeon: [name], MD;  [facility];  Service: Endoscopy;  Laterality: N/A;  . ESOPHAGEAL BANDING N/A 06/19/2014   Procedure: ESOPHAGEAL BANDING;  Surgeon:  [name], MD;  [facility];  Service: Endoscopy;  Laterality: N/A;  . ESOPHAGEAL BANDING N/A 01/05/2016   Procedure: ESOPHAGEAL BANDING;  Surgeon: [name], MD;  [facility];  Service: Endoscopy;  Laterality: N/A;  . ESOPHAGEAL BANDING N/A 08/03/2016   Procedure: ESOPHAGEAL BANDING;  Surgeon: [name], MD;  [facility];  Service: Endoscopy;  Laterality: N/A;  . ESOPHAGEAL BANDING N/A 05/02/2017   Procedure: ESOPHAGEAL BANDING;  Surgeon: [name], MD;  [facility];  Service: Endoscopy;  Laterality: N/A;  . ESOPHAGOGASTRODUODENOSCOPY  04/24/2012   Procedure: ESOPHAGOGASTRODUODENOSCOPY (EGD);  Surgeon: [name], MD;  [facility];  Service: Endoscopy;  Laterality: N/A;  300  . ESOPHAGOGASTRODUODENOSCOPY N/A 01/29/2013   Procedure: ESOPHAGOGASTRODUODENOSCOPY (EGD);  Surgeon: [name], MD;  [facility];  Service: Endoscopy;  Laterality: N/A;  1200  . ESOPHAGOGASTRODUODENOSCOPY N/A 05/22/2013   Procedure: ESOPHAGOGASTRODUODENOSCOPY (EGD);  Surgeon: [name], MD;  [facility];  Service: Endoscopy;  Laterality: N/A;  1:55  . ESOPHAGOGASTRODUODENOSCOPY N/A 12/31/2013   Procedure: ESOPHAGOGASTRODUODENOSCOPY (EGD);  Surgeon: [name], MD;  [facility];  Service: Endoscopy;  Laterality: N/A;  1200  . ESOPHAGOGASTRODUODENOSCOPY N/A 06/19/2014   Procedure: ESOPHAGOGASTRODUODENOSCOPY (EGD);  Surgeon: [name], MD;  [facility];  Service: Endoscopy;  Laterality:  N/A;  1055  . ESOPHAGOGASTRODUODENOSCOPY N/A 01/15/2015   Procedure: ESOPHAGOGASTRODUODENOSCOPY (EGD);  Surgeon: [name], MD;  [facility];  Service: Endoscopy;  Laterality: N/A;  730 - moved to 9:45 - moved to 1250-Ann notified pt  . ESOPHAGOGASTRODUODENOSCOPY N/A 06/30/2015   Procedure: ESOPHAGOGASTRODUODENOSCOPY (EGD);  Surgeon: [name], MD;  [facility];  Service: Endoscopy;  Laterality: N/A;  1200  . ESOPHAGOGASTRODUODENOSCOPY N/A 01/05/2016   Procedure: ESOPHAGOGASTRODUODENOSCOPY (EGD);  Surgeon: [name], MD;  [facility];  Service: Endoscopy;  Laterality: N/A;  830  . ESOPHAGOGASTRODUODENOSCOPY N/A 08/03/2016   Procedure: ESOPHAGOGASTRODUODENOSCOPY (EGD);  Surgeon: [name], MD;  [facility];  Service: Endoscopy;  Laterality: N/A;  930  . ESOPHAGOGASTRODUODENOSCOPY N/A 05/02/2017   Procedure: ESOPHAGOGASTRODUODENOSCOPY (EGD);  Surgeon: [name], MD;  [facility];  Service: Endoscopy;  Laterality: N/A;  12:00  . ESOPHAGOGASTRODUODENOSCOPY N/A 08/17/2017   Procedure: ESOPHAGOGASTRODUODENOSCOPY (EGD);  Surgeon: [name], MD;  [facility];  Service: Endoscopy;  Laterality: N/A;  8:15  . ESOPHAGOGASTRODUODENOSCOPY N/A 09/12/2017   Procedure: ESOPHAGOGASTRODUODENOSCOPY (EGD);  Surgeon: [name], MD;  [facility];  Service: Endoscopy;  Laterality: N/A;  1040  . ESOPHAGOGASTRODUODENOSCOPY N/A 10/10/2017   Procedure: ESOPHAGOGASTRODUODENOSCOPY (EGD);  Surgeon: [name], MD;  [facility];  Service: Endoscopy;  Laterality: N/A;  225  . ESOPHAGOGASTRODUODENOSCOPY N/A 11/09/2017   Procedure: ESOPHAGOGASTRODUODENOSCOPY (EGD);  Surgeon: [name], MD;  [facility];  Service: Endoscopy;  Laterality: N/A;  . ESOPHAGOGASTRODUODENOSCOPY (EGD) WITH PROPOFOL  09/24/2012   Procedure: ESOPHAGOGASTRODUODENOSCOPY (EGD) WITH PROPOFOL;  Surgeon: [name], MD;  [facility];  Service: Endoscopy;  Laterality: N/A;  GE junction at 36  . ESOPHAGOGASTRODUODENOSCOPY (EGD) WITH PROPOFOL N/A 07/06/2017   Procedure: ESOPHAGOGASTRODUODENOSCOPY (EGD) WITH PROPOFOL;  Surgeon: [name], MD;  [facility];  Service: Endoscopy;  Laterality: N/A;  . ESOPHAGOGASTRODUODENOSCOPY W/ BANDING  08/2010  . GIVENS CAPSULE STUDY  N/A 07/05/2017   Procedure: GIVENS CAPSULE STUDY;  Surgeon: [name], MD;  [facility];  Service: Endoscopy;  Laterality: N/A;  . HOT HEMOSTASIS N/A 08/17/2017   Procedure: HOT HEMOSTASIS (ARGON PLASMA COAGULATION/BICAP);  Surgeon: [name], MD;  [facility];  Service: Endoscopy;  Laterality: N/A;  . HOT HEMOSTASIS  09/12/2017   Procedure: HOT HEMOSTASIS (ARGON PLASMA COAGULATION/BICAP);  Surgeon: [name], MD;  [facility];  Service: Endoscopy;;  gastric  . HOT HEMOSTASIS  10/10/2017   Procedure: HOT HEMOSTASIS (ARGON PLASMA COAGULATION/BICAP);  Surgeon: [name], MD;  [facility];  Service: Endoscopy;;  . POLYPECTOMY  03/24/2016   Procedure: POLYPECTOMY;  Surgeon: [name], MD;  [facility];  Service: Endoscopy;;  sigmoid polyp  . RIGHT HEART CATH N/A 04/16/2017   Procedure: Right Heart Cath;  Surgeon: [name], MD;  Location: [place] [facility];  Service: Cardiovascular;  Laterality: N/A;  . RIGHT HEART CATH N/A 07/12/2017   Procedure: RIGHT HEART CATH;  Surgeon: [name], MD;  Location: [place] [facility];  Service: Cardiovascular;  Laterality: N/A;  . TONSILLECTOMY    . UPPER GASTROINTESTINAL ENDOSCOPY  03/15/2011   EGD ED BANDING/TCS  . UPPER GASTROINTESTINAL ENDOSCOPY  09/05/2010  . UPPER GASTROINTESTINAL ENDOSCOPY  08/11/2010  . VAGINAL HYSTERECTOMY      Family History  Problem  Relation Age of Onset  . Lung cancer Mother   . Diabetes Father   . Diabetes Sister   . Hypertension Sister   . Hypothyroidism Sister   . Colon cancer Brother   . Diabetes Sister   . Hypothyroidism Brother   . Healthy Daughter   . Obesity Daughter   . Hypertension Daughter     Social History:  reports that she has never smoked. She has never used smokeless tobacco. She reports that she does not drink alcohol or use drugs.  Allergies:  Allergies  Allergen Reactions  . Ferumoxytol Other  (See Comments)    Patient has severe back and chest pain when receiving FERAHEME infusions.   . Tramadol Hcl Other (See Comments)    WEAKNESS     Medications: I have reviewed the patient's current medications.  Results for orders placed or performed during the hospital encounter of 05/31/19 (from the past 48 hour(s))  Comprehensive metabolic panel     Status: Abnormal   Collection Time: 05/31/19  9:42 PM  Result Value Ref Range   Sodium 137 135 - 145 mmol/L   Potassium 4.7 3.5 - 5.1 mmol/L   Chloride 109 98 - 111 mmol/L   CO2 19 (L) 22 - 32 mmol/L   Glucose, Bld 196 (H) 70 - 99 mg/dL   BUN 22 8 - 23 mg/dL   Creatinine, Ser 0.89 0.44 - 1.00 mg/dL   Calcium 8.4 (L) 8.9 - 10.3 mg/dL   Total Protein 4.9 (L) 6.5 - 8.1 g/dL   Albumin 2.2 (L) 3.5 - 5.0 g/dL   AST 55 (H) 15 - 41 U/L   ALT 31 0 - 44 U/L   Alkaline Phosphatase 86 38 - 126 U/L   Total Bilirubin 2.8 (H) 0.3 - 1.2 mg/dL   GFR calc non Af Amer >60 >60 mL/min   GFR calc Af Amer >60 >60 mL/min   Anion gap 9 5 - 15    Comment: Performed at [facility], [address] [place], [place] [address]  Type and screen [facility]     Status: None (Preliminary result)   Collection Time: 05/31/19  9:42 PM  Result Value Ref Range   ABO/RH(D) O NEG    Antibody Screen NEG    Sample Expiration 06/03/2019,2359    Unit Number [id number]    Blood Component Type RED CELLS,LR    Unit division 00    Status of Unit ISS'D ANOTH PT    Transfusion Status OK TO TRANSFUSE    Crossmatch Result Compatible    Unit Number [id number]    Blood Component Type RED CELLS,LR    Unit division 00    Status of Unit ISSUED    Transfusion Status OK TO TRANSFUSE    Crossmatch Result      Compatible Performed at [facility], [address] [place], [place] [address]   Prepare RBC     Status: None   Collection Time: 05/31/19  9:42 PM  Result Value Ref Range   Order Confirmation      ORDER PROCESSED BY BLOOD BANK Performed at [facility], [address], [place],  [address]   Urinalysis, Routine w reflex microscopic     Status: Abnormal   Collection Time: 05/31/19 10:19 PM  Result Value Ref Range   Color, Urine AMBER (A) YELLOW    Comment: BIOCHEMICALS MAY BE AFFECTED BY COLOR   APPearance HAZY (A) CLEAR   Specific Gravity, Urine 1.019 1.005 - 1.030  pH 6.0 5.0 - 8.0   Glucose, UA NEGATIVE NEGATIVE mg/dL   Hgb urine dipstick NEGATIVE NEGATIVE   Bilirubin Urine NEGATIVE NEGATIVE   Ketones, ur NEGATIVE NEGATIVE mg/dL   Protein, ur NEGATIVE NEGATIVE mg/dL   Nitrite NEGATIVE NEGATIVE   Leukocytes,Ua NEGATIVE NEGATIVE    Comment: Performed at [facility], [address], , [place] [address]  CBC with Differential     Status: Abnormal   Collection Time: 05/31/19 10:24 PM  Result Value Ref Range   WBC 4.2 4.0 - 10.5 K/uL   RBC 2.26 (L) 3.87 - 5.11 MIL/uL   Hemoglobin 5.9 (LL) 12.0 - 15.0 g/dL    Comment: This critical result has verified and been called to PRUITT,G by Duncan Dull on 09 12 2020 at 2249, and has been read back.    HCT 21.2 (L) 36.0 - 46.0 %   MCV 93.8 80.0 - 100.0 fL   MCH 26.1 26.0 - 34.0 pg   MCHC 27.8 (L) 30.0 - 36.0 g/dL   RDW 19.2 (H) 11.5 - 15.5 %   Platelets 71 (L) 150 - 400 K/uL    Comment: SPECIMEN CHECKED FOR CLOTS   nRBC 0.0 0.0 - 0.2 %   Neutrophils Relative % 60 %   Neutro Abs 2.5 1.7 - 7.7 K/uL   Lymphocytes Relative 27 %   Lymphs Abs 1.1 0.7 - 4.0 K/uL   Monocytes Relative 11 %   Monocytes Absolute 0.5 0.1 - 1.0 K/uL   Eosinophils Relative 2 %   Eosinophils Absolute 0.1 0.0 - 0.5 K/uL   Basophils Relative 0 %   Basophils Absolute 0.0 0.0 - 0.1 K/uL   Immature Granulocytes 0 %   Abs Immature Granulocytes 0.01 0.00 - 0.07 K/uL    Comment: Performed at [facility], [address] [place], [place] [address]  Comprehensive metabolic panel     Status: Abnormal   Collection Time: 05/31/19 10:24 PM  Result Value Ref Range   Sodium 138 135 - 145 mmol/L   Potassium 4.1  3.5 - 5.1 mmol/L   Chloride 109 98 - 111 mmol/L   CO2 21 (L) 22 - 32 mmol/L   Glucose, Bld 170 (H) 70 - 99 mg/dL   BUN 21 8 - 23 mg/dL   Creatinine, Ser 0.96 0.44 - 1.00 mg/dL   Calcium 8.4 (L) 8.9 - 10.3 mg/dL   Total Protein 4.9 (L) 6.5 - 8.1 g/dL   Albumin 2.2 (L) 3.5 - 5.0 g/dL   AST 41 15 - 41 U/L   ALT 31 0 - 44 U/L   Alkaline Phosphatase 92 38 - 126 U/L   Total Bilirubin 2.9 (H) 0.3 - 1.2 mg/dL   GFR calc non Af Amer >60 >60 mL/min   GFR calc Af Amer >60 >60 mL/min   Anion gap 8 5 - 15    Comment: Performed at [facility], [address] [place], [place] [address]  SARS Coronavirus 2 [facility] order, Performed in [facility] hospital lab) Nasopharyngeal Nasopharyngeal Swab     Status: None   Collection Time: 06/01/19  1:34 AM   Specimen: Nasopharyngeal Swab  Result Value Ref Range   SARS Coronavirus 2 NEGATIVE NEGATIVE    Comment: (NOTE) If result is NEGATIVE SARS-CoV-2 target nucleic acids are NOT DETECTED. The SARS-CoV-2 RNA is generally detectable in upper and lower  respiratory specimens during the acute phase of infection. The lowest  concentration of SARS-CoV-2 viral copies this assay can detect is 250  copies / mL. A negative result does not preclude SARS-CoV-2 infection  and should not be used as the sole basis for treatment or other  patient management decisions.  A negative result may occur with  improper specimen collection / handling, submission of specimen other  than nasopharyngeal swab, presence of viral mutation(s) within the  areas targeted by this assay, and inadequate number of viral copies  (<250 copies / mL). A negative result must be combined with clinical  observations, patient history, and epidemiological information. If result is POSITIVE SARS-CoV-2 target nucleic acids are DETECTED. The SARS-CoV-2 RNA is generally detectable in upper and lower  respiratory specimens dur ing the acute phase of infection.  Positive  results are indicative of  active infection with SARS-CoV-2.  Clinical  correlation with patient history and other diagnostic information is  necessary to determine patient infection status.  Positive results do  not rule out bacterial infection or co-infection with other viruses. If result is PRESUMPTIVE POSTIVE SARS-CoV-2 nucleic acids MAY BE PRESENT.   A presumptive positive result was obtained on the submitted specimen  and confirmed on repeat testing.  While 2019 novel coronavirus  (SARS-CoV-2) nucleic acids may be present in the submitted sample  additional confirmatory testing may be necessary for epidemiological  and / or clinical management purposes  to differentiate between  SARS-CoV-2 and other Sarbecovirus currently known to infect humans.  If clinically indicated additional testing with an alternate test  methodology [phone]) is advised. The SARS-CoV-2 RNA is generally  detectable in upper and lower respiratory sp ecimens during the acute  phase of infection. The expected result is Negative. Fact Sheet for Patients:  StrictlyIdeas.no Fact Sheet for Healthcare Providers: BankingDealers.co.za This test is not yet approved or cleared by the [place] FDA and has been authorized for detection and/or diagnosis of SARS-CoV-2 by FDA under an Emergency Use Authorization (EUA).  This EUA will remain in effect (meaning this test can be used) for the duration of the COVID-19 declaration under Section 564(b)(1) of the Act, 21 U.S.C. section 360bbb-3(b)(1), unless the authorization is terminated or revoked sooner. Performed at [facility], [address] [place], [place] [address]   Glucose, capillary     Status: Abnormal   Collection Time: 06/01/19  2:43 AM  Result Value Ref Range   Glucose-Capillary 109 (H) 70 - 99 mg/dL   Comment 1 Notify RN    Comment 2 Document in Chart   Glucose, capillary     Status: Abnormal   Collection Time: 06/01/19  7:42 AM   Result Value Ref Range   Glucose-Capillary 180 (H) 70 - 99 mg/dL    No results found.  ROS:  Pertinent items are noted in HPI.  Blood pressure (!) 133/37, pulse 85, temperature 98.9 F (37.2 C), temperature source Oral, resp. rate 16, height 5' 4"  (1.626 m), weight 94.4 kg, SpO2 98 %. Physical Exam: Pleasant white female no acute distress Head is normocephalic, atraumatic Lungs clear to auscultation with good breath sounds bilaterally Heart examination reveals a regular rate and rhythm without S3, S4, murmurs  Labs reviewed  Assessment/Plan: Impression: Chronic anemia, GI bleed, need for central venous access Plan: We will place Port-A-Cath tomorrow.  The risks and benefits of the procedure including bleeding, infection, and pneumothorax were fully explained to the patient, who gave informed consent.  Maria [name] 06/01/2019, 8:16 AM

## 2019-06-01 NOTE — Plan of Care (Signed)
  Problem: Education: Goal: Knowledge of General Education information will improve Description Including pain rating scale, medication(s)/side effects and non-pharmacologic comfort measures Outcome: Progressing   Problem: Health Behavior/Discharge Planning: Goal: Ability to manage health-related needs will improve Outcome: Progressing   

## 2019-06-01 NOTE — H&P (View-Only) (Signed)
Reason for Consult: Anemia, GI bleed Referring Physician: [name] is an 63 y.o. female.  HPI: Patient is a 63 year old white female who was previously scheduled to have a Port-A-Cath inserted on 06/04/2019 for anemia and the need for chronic IV access who was admitted to the hospital for anemia.  She is receiving blood transfusions at this present time.  She denies any pain.  Past Medical History:  Diagnosis Date  . Anemia   . CHF (congestive heart failure) ([place])   . Cirrhosis ([place])   . Depression   . Diabetes mellitus   . Esophageal bleed, non-variceal   . Eye hemorrhage    Behind left eye  . Fibromyalgia   . Hypercholesteremia   . Hypertension   . Hypothyroidism   . NAFLD (nonalcoholic fatty liver disease)   . Osteoarthrosis   . Osteoporosis   . PONV (postoperative nausea and vomiting)   . UTI (urinary tract infection) 11/12 end of the month   Patient feels that she passed a kidney stone at that time    Past Surgical History:  Procedure Laterality Date  . APPENDECTOMY  1980  . BILATERAL SALPINGOOPHORECTOMY    . CHOLECYSTECTOMY    . COLONOSCOPY  03/15/2011  . COLONOSCOPY N/A 03/24/2016   Procedure: COLONOSCOPY;  Surgeon: [name], MD;  [facility];  Service: Endoscopy;  Laterality: N/A;  855  . ESOPHAGEAL BANDING  04/24/2012   Procedure: ESOPHAGEAL BANDING;  Surgeon: [name], MD;  [facility];  Service: Endoscopy;  Laterality: N/A;  . Esophageal BANDING  08/03/2012   [facility] in [place] [facility] [place]  09/24/2012   Procedure: ESOPHAGEAL BANDING;  Surgeon: [name], MD;  Location: [facility];  Service: Endoscopy;  Laterality: N/A;  Banding x 3  . ESOPHAGEAL BANDING N/A 01/29/2013   Procedure: ESOPHAGEAL BANDING;  Surgeon: [name], MD;  [facility];  Service: Endoscopy;  Laterality: N/A;  . ESOPHAGEAL BANDING N/A 06/19/2014   Procedure: ESOPHAGEAL BANDING;  Surgeon:  [name], MD;  [facility];  Service: Endoscopy;  Laterality: N/A;  . ESOPHAGEAL BANDING N/A 01/05/2016   Procedure: ESOPHAGEAL BANDING;  Surgeon: [name], MD;  [facility];  Service: Endoscopy;  Laterality: N/A;  . ESOPHAGEAL BANDING N/A 08/03/2016   Procedure: ESOPHAGEAL BANDING;  Surgeon: [name], MD;  [facility];  Service: Endoscopy;  Laterality: N/A;  . ESOPHAGEAL BANDING N/A 05/02/2017   Procedure: ESOPHAGEAL BANDING;  Surgeon: [name], MD;  [facility];  Service: Endoscopy;  Laterality: N/A;  . ESOPHAGOGASTRODUODENOSCOPY  04/24/2012   Procedure: ESOPHAGOGASTRODUODENOSCOPY (EGD);  Surgeon: [name], MD;  [facility];  Service: Endoscopy;  Laterality: N/A;  300  . ESOPHAGOGASTRODUODENOSCOPY N/A 01/29/2013   Procedure: ESOPHAGOGASTRODUODENOSCOPY (EGD);  Surgeon: [name], MD;  [facility];  Service: Endoscopy;  Laterality: N/A;  1200  . ESOPHAGOGASTRODUODENOSCOPY N/A 05/22/2013   Procedure: ESOPHAGOGASTRODUODENOSCOPY (EGD);  Surgeon: [name], MD;  [facility];  Service: Endoscopy;  Laterality: N/A;  1:55  . ESOPHAGOGASTRODUODENOSCOPY N/A 12/31/2013   Procedure: ESOPHAGOGASTRODUODENOSCOPY (EGD);  Surgeon: [name], MD;  [facility];  Service: Endoscopy;  Laterality: N/A;  1200  . ESOPHAGOGASTRODUODENOSCOPY N/A 06/19/2014   Procedure: ESOPHAGOGASTRODUODENOSCOPY (EGD);  Surgeon: [name], MD;  [facility];  Service: Endoscopy;  Laterality:  N/A;  1055  . ESOPHAGOGASTRODUODENOSCOPY N/A 01/15/2015   Procedure: ESOPHAGOGASTRODUODENOSCOPY (EGD);  Surgeon: [name], MD;  [facility];  Service: Endoscopy;  Laterality: N/A;  730 - moved to 9:45 - moved to 1250-Ann notified pt  . ESOPHAGOGASTRODUODENOSCOPY N/A 06/30/2015   Procedure: ESOPHAGOGASTRODUODENOSCOPY (EGD);  Surgeon: [name], MD;  [facility];  Service: Endoscopy;  Laterality: N/A;  1200  . ESOPHAGOGASTRODUODENOSCOPY N/A 01/05/2016   Procedure: ESOPHAGOGASTRODUODENOSCOPY (EGD);  Surgeon: [name], MD;  [facility];  Service: Endoscopy;  Laterality: N/A;  830  . ESOPHAGOGASTRODUODENOSCOPY N/A 08/03/2016   Procedure: ESOPHAGOGASTRODUODENOSCOPY (EGD);  Surgeon: [name], MD;  [facility];  Service: Endoscopy;  Laterality: N/A;  930  . ESOPHAGOGASTRODUODENOSCOPY N/A 05/02/2017   Procedure: ESOPHAGOGASTRODUODENOSCOPY (EGD);  Surgeon: [name], MD;  [facility];  Service: Endoscopy;  Laterality: N/A;  12:00  . ESOPHAGOGASTRODUODENOSCOPY N/A 08/17/2017   Procedure: ESOPHAGOGASTRODUODENOSCOPY (EGD);  Surgeon: [name], MD;  [facility];  Service: Endoscopy;  Laterality: N/A;  8:15  . ESOPHAGOGASTRODUODENOSCOPY N/A 09/12/2017   Procedure: ESOPHAGOGASTRODUODENOSCOPY (EGD);  Surgeon: [name], MD;  [facility];  Service: Endoscopy;  Laterality: N/A;  1040  . ESOPHAGOGASTRODUODENOSCOPY N/A 10/10/2017   Procedure: ESOPHAGOGASTRODUODENOSCOPY (EGD);  Surgeon: [name], MD;  [facility];  Service: Endoscopy;  Laterality: N/A;  225  . ESOPHAGOGASTRODUODENOSCOPY N/A 11/09/2017   Procedure: ESOPHAGOGASTRODUODENOSCOPY (EGD);  Surgeon: [name], MD;  [facility];  Service: Endoscopy;  Laterality: N/A;  . ESOPHAGOGASTRODUODENOSCOPY (EGD) WITH PROPOFOL  09/24/2012   Procedure: ESOPHAGOGASTRODUODENOSCOPY (EGD) WITH PROPOFOL;  Surgeon: [name], MD;  [facility];  Service: Endoscopy;  Laterality: N/A;  GE junction at 36  . ESOPHAGOGASTRODUODENOSCOPY (EGD) WITH PROPOFOL N/A 07/06/2017   Procedure: ESOPHAGOGASTRODUODENOSCOPY (EGD) WITH PROPOFOL;  Surgeon: [name], MD;  [facility];  Service: Endoscopy;  Laterality: N/A;  . ESOPHAGOGASTRODUODENOSCOPY W/ BANDING  08/2010  . GIVENS CAPSULE STUDY  N/A 07/05/2017   Procedure: GIVENS CAPSULE STUDY;  Surgeon: [name], MD;  [facility];  Service: Endoscopy;  Laterality: N/A;  . HOT HEMOSTASIS N/A 08/17/2017   Procedure: HOT HEMOSTASIS (ARGON PLASMA COAGULATION/BICAP);  Surgeon: [name], MD;  [facility];  Service: Endoscopy;  Laterality: N/A;  . HOT HEMOSTASIS  09/12/2017   Procedure: HOT HEMOSTASIS (ARGON PLASMA COAGULATION/BICAP);  Surgeon: [name], MD;  [facility];  Service: Endoscopy;;  gastric  . HOT HEMOSTASIS  10/10/2017   Procedure: HOT HEMOSTASIS (ARGON PLASMA COAGULATION/BICAP);  Surgeon: [name], MD;  [facility];  Service: Endoscopy;;  . POLYPECTOMY  03/24/2016   Procedure: POLYPECTOMY;  Surgeon: [name], MD;  [facility];  Service: Endoscopy;;  sigmoid polyp  . RIGHT HEART CATH N/A 04/16/2017   Procedure: Right Heart Cath;  Surgeon: [name], MD;  Location: [facility];  Service: Cardiovascular;  Laterality: N/A;  . RIGHT HEART CATH N/A 07/12/2017   Procedure: RIGHT HEART CATH;  Surgeon: [name], MD;  Location: [facility];  Service: Cardiovascular;  Laterality: N/A;  . TONSILLECTOMY    . UPPER GASTROINTESTINAL ENDOSCOPY  03/15/2011   EGD ED BANDING/TCS  . UPPER GASTROINTESTINAL ENDOSCOPY  09/05/2010  . UPPER GASTROINTESTINAL ENDOSCOPY  08/11/2010  . VAGINAL HYSTERECTOMY      Family History  Problem  Relation Age of Onset  . Lung cancer Mother   . Diabetes Father   . Diabetes Sister   . Hypertension Sister   . Hypothyroidism Sister   . Colon cancer Brother   . Diabetes Sister   . Hypothyroidism Brother   . Healthy Daughter   . Obesity Daughter   . Hypertension Daughter     Social History:  reports that she has never smoked. She has never used smokeless tobacco. She reports that she does not drink alcohol or use drugs.  Allergies:  Allergies  Allergen Reactions  . Ferumoxytol Other  (See Comments)    Patient has severe back and chest pain when receiving FERAHEME infusions.   . Tramadol Hcl Other (See Comments)    WEAKNESS     Medications: I have reviewed the patient's current medications.  Results for orders placed or performed during the hospital encounter of 05/31/19 (from the past 48 hour(s))  Comprehensive metabolic panel     Status: Abnormal   Collection Time: 05/31/19  9:42 PM  Result Value Ref Range   Sodium 137 135 - 145 mmol/L   Potassium 4.7 3.5 - 5.1 mmol/L   Chloride 109 98 - 111 mmol/L   CO2 19 (L) 22 - 32 mmol/L   Glucose, Bld 196 (H) 70 - 99 mg/dL   BUN 22 8 - 23 mg/dL   Creatinine, Ser 0.89 0.44 - 1.00 mg/dL   Calcium 8.4 (L) 8.9 - 10.3 mg/dL   Total Protein 4.9 (L) 6.5 - 8.1 g/dL   Albumin 2.2 (L) 3.5 - 5.0 g/dL   AST 55 (H) 15 - 41 U/L   ALT 31 0 - 44 U/L   Alkaline Phosphatase 86 38 - 126 U/L   Total Bilirubin 2.8 (H) 0.3 - 1.2 mg/dL   GFR calc non Af Amer >60 >60 mL/min   GFR calc Af Amer >60 >60 mL/min   Anion gap 9 5 - 15    Comment: Performed at [facility], [address] [place], [place] [address]  Type and screen [facility]     Status: None (Preliminary result)   Collection Time: 05/31/19  9:42 PM  Result Value Ref Range   ABO/RH(D) O NEG    Antibody Screen NEG    Sample Expiration 06/03/2019,2359    Unit Number [id number]    Blood Component Type RED CELLS,LR    Unit division 00    Status of Unit ISS'D ANOTH PT    Transfusion Status OK TO TRANSFUSE    Crossmatch Result Compatible    Unit Number [id number]    Blood Component Type RED CELLS,LR    Unit division 00    Status of Unit ISSUED    Transfusion Status OK TO TRANSFUSE    Crossmatch Result      Compatible Performed at [facility] [address] [place], [place] [address]   Prepare RBC     Status: None   Collection Time: 05/31/19  9:42 PM  Result Value Ref Range   Order Confirmation      ORDER PROCESSED BY BLOOD BANK Performed at [facility], [address], [place], [place] [address]   Urinalysis, Routine w reflex microscopic     Status: Abnormal   Collection Time: 05/31/19 10:19 PM  Result Value Ref Range   Color, Urine AMBER (A) YELLOW    Comment: BIOCHEMICALS MAY BE AFFECTED BY COLOR   APPearance HAZY (A) CLEAR   Specific Gravity, Urine 1.019 1.005 - 1.030  pH 6.0 5.0 - 8.0   Glucose, UA NEGATIVE NEGATIVE mg/dL   Hgb urine dipstick NEGATIVE NEGATIVE   Bilirubin Urine NEGATIVE NEGATIVE   Ketones, ur NEGATIVE NEGATIVE mg/dL   Protein, ur NEGATIVE NEGATIVE mg/dL   Nitrite NEGATIVE NEGATIVE   Leukocytes,Ua NEGATIVE NEGATIVE    Comment: Performed at [facility], [address] [place], [place] [address]  CBC with Differential     Status: Abnormal   Collection Time: 05/31/19 10:24 PM  Result Value Ref Range   WBC 4.2 4.0 - 10.5 K/uL   RBC 2.26 (L) 3.87 - 5.11 MIL/uL   Hemoglobin 5.9 (LL) 12.0 - 15.0 g/dL    Comment: This critical result has verified and been called to PRUITT,G by Duncan Dull on 09 12 2020 at 2249, and has been read back.    HCT 21.2 (L) 36.0 - 46.0 %   MCV 93.8 80.0 - 100.0 fL   MCH 26.1 26.0 - 34.0 pg   MCHC 27.8 (L) 30.0 - 36.0 g/dL   RDW 19.2 (H) 11.5 - 15.5 %   Platelets 71 (L) 150 - 400 K/uL    Comment: SPECIMEN CHECKED FOR CLOTS   nRBC 0.0 0.0 - 0.2 %   Neutrophils Relative % 60 %   Neutro Abs 2.5 1.7 - 7.7 K/uL   Lymphocytes Relative 27 %   Lymphs Abs 1.1 0.7 - 4.0 K/uL   Monocytes Relative 11 %   Monocytes Absolute 0.5 0.1 - 1.0 K/uL   Eosinophils Relative 2 %   Eosinophils Absolute 0.1 0.0 - 0.5 K/uL   Basophils Relative 0 %   Basophils Absolute 0.0 0.0 - 0.1 K/uL   Immature Granulocytes 0 %   Abs Immature Granulocytes 0.01 0.00 - 0.07 K/uL    Comment: Performed at [facility], [address] [place], [place] [address]  Comprehensive metabolic panel     Status: Abnormal   Collection Time: 05/31/19 10:24 PM  Result Value Ref Range   Sodium 138 135 - 145 mmol/L   Potassium 4.1  3.5 - 5.1 mmol/L   Chloride 109 98 - 111 mmol/L   CO2 21 (L) 22 - 32 mmol/L   Glucose, Bld 170 (H) 70 - 99 mg/dL   BUN 21 8 - 23 mg/dL   Creatinine, Ser 0.96 0.44 - 1.00 mg/dL   Calcium 8.4 (L) 8.9 - 10.3 mg/dL   Total Protein 4.9 (L) 6.5 - 8.1 g/dL   Albumin 2.2 (L) 3.5 - 5.0 g/dL   AST 41 15 - 41 U/L   ALT 31 0 - 44 U/L   Alkaline Phosphatase 92 38 - 126 U/L   Total Bilirubin 2.9 (H) 0.3 - 1.2 mg/dL   GFR calc non Af Amer >60 >60 mL/min   GFR calc Af Amer >60 >60 mL/min   Anion gap 8 5 - 15    Comment: Performed at [facility], [address], [place], [place] [address]  SARS Coronavirus 2 [facility] order, Performed in [facility] hospital lab) Nasopharyngeal Nasopharyngeal Swab     Status: None   Collection Time: 06/01/19  1:34 AM   Specimen: Nasopharyngeal Swab  Result Value Ref Range   SARS Coronavirus 2 NEGATIVE NEGATIVE    Comment: (NOTE) If result is NEGATIVE SARS-CoV-2 target nucleic acids are NOT DETECTED. The SARS-CoV-2 RNA is generally detectable in upper and lower  respiratory specimens during the acute phase of infection. The lowest  concentration of SARS-CoV-2 viral copies this assay can detect is 250  copies / mL. A negative result does not preclude SARS-CoV-2 infection  and should not be used as the sole basis for treatment or other  patient management decisions.  A negative result may occur with  improper specimen collection / handling, submission of specimen other  than nasopharyngeal swab, presence of viral mutation(s) within the  areas targeted by this assay, and inadequate number of viral copies  (<250 copies / mL). A negative result must be combined with clinical  observations, patient history, and epidemiological information. If result is POSITIVE SARS-CoV-2 target nucleic acids are DETECTED. The SARS-CoV-2 RNA is generally detectable in upper and lower  respiratory specimens dur ing the acute phase of infection.  Positive  results are indicative of  active infection with SARS-CoV-2.  Clinical  correlation with patient history and other diagnostic information is  necessary to determine patient infection status.  Positive results do  not rule out bacterial infection or co-infection with other viruses. If result is PRESUMPTIVE POSTIVE SARS-CoV-2 nucleic acids MAY BE PRESENT.   A presumptive positive result was obtained on the submitted specimen  and confirmed on repeat testing.  While 2019 novel coronavirus  (SARS-CoV-2) nucleic acids may be present in the submitted sample  additional confirmatory testing may be necessary for epidemiological  and / or clinical management purposes  to differentiate between  SARS-CoV-2 and other Sarbecovirus currently known to infect humans.  If clinically indicated additional testing with an alternate test  methodology [phone]) is advised. The SARS-CoV-2 RNA is generally  detectable in upper and lower respiratory sp ecimens during the acute  phase of infection. The expected result is Negative. Fact Sheet for Patients:  StrictlyIdeas.no Fact Sheet for Healthcare Providers: BankingDealers.co.za This test is not yet approved or cleared by the [place] FDA and has been authorized for detection and/or diagnosis of SARS-CoV-2 by FDA under an Emergency Use Authorization (EUA).  This EUA will remain in effect (meaning this test can be used) for the duration of the COVID-19 declaration under Section 564(b)(1) of the Act, 21 U.S.C. section 360bbb-3(b)(1), unless the authorization is terminated or revoked sooner. Performed at [facility], [address], [place], [place] [address]   Glucose, capillary     Status: Abnormal   Collection Time: 06/01/19  2:43 AM  Result Value Ref Range   Glucose-Capillary 109 (H) 70 - 99 mg/dL   Comment 1 Notify RN    Comment 2 Document in Chart   Glucose, capillary     Status: Abnormal   Collection Time: 06/01/19  7:42 AM   Result Value Ref Range   Glucose-Capillary 180 (H) 70 - 99 mg/dL    No results found.  ROS:  Pertinent items are noted in HPI.  Blood pressure (!) 133/37, pulse 85, temperature 98.9 F (37.2 C), temperature source Oral, resp. rate 16, height 5' 4"  (1.626 m), weight 94.4 kg, SpO2 98 %. Physical Exam: Pleasant white female no acute distress Head is normocephalic, atraumatic Lungs clear to auscultation with good breath sounds bilaterally Heart examination reveals a regular rate and rhythm without S3, S4, murmurs  Labs reviewed  Assessment/Plan: Impression: Chronic anemia, GI bleed, need for central venous access Plan: We will place Port-A-Cath tomorrow.  The risks and benefits of the procedure including bleeding, infection, and pneumothorax were fully explained to the patient, who gave informed consent.  Aviva Signs 06/01/2019, 8:16 AM

## 2019-06-02 ENCOUNTER — Observation Stay (HOSPITAL_COMMUNITY): Payer: PPO

## 2019-06-02 ENCOUNTER — Other Ambulatory Visit (HOSPITAL_COMMUNITY): Payer: Self-pay | Admitting: *Deleted

## 2019-06-02 ENCOUNTER — Observation Stay (HOSPITAL_COMMUNITY): Payer: PPO | Admitting: Anesthesiology

## 2019-06-02 ENCOUNTER — Other Ambulatory Visit: Payer: Self-pay

## 2019-06-02 ENCOUNTER — Encounter (HOSPITAL_COMMUNITY): Payer: Self-pay

## 2019-06-02 ENCOUNTER — Encounter (HOSPITAL_COMMUNITY)
Admission: RE | Admit: 2019-06-02 | Discharge: 2019-06-02 | Disposition: A | Discharge status: Home / Self Care | Payer: PPO | Source: Ambulatory Visit | Attending: General Surgery | Admitting: General Surgery

## 2019-06-02 ENCOUNTER — Other Ambulatory Visit (HOSPITAL_COMMUNITY)
Admission: RE | Admit: 2019-06-02 | Discharge: 2019-06-02 | Disposition: A | Discharge status: Home / Self Care | Payer: PPO | Source: Ambulatory Visit | Attending: General Surgery | Admitting: General Surgery

## 2019-06-02 ENCOUNTER — Encounter (HOSPITAL_COMMUNITY)
Admission: EM | Disposition: A | Discharge status: Home / Self Care | Payer: Self-pay | Source: Home / Self Care | Attending: Family Medicine

## 2019-06-02 DIAGNOSIS — Z79890 Hormone replacement therapy (postmenopausal): Secondary | ICD-10-CM | POA: Diagnosis not present

## 2019-06-02 DIAGNOSIS — Z20828 Contact with and (suspected) exposure to other viral communicable diseases: Secondary | ICD-10-CM | POA: Diagnosis present

## 2019-06-02 DIAGNOSIS — K7460 Unspecified cirrhosis of liver: Secondary | ICD-10-CM | POA: Diagnosis present

## 2019-06-02 DIAGNOSIS — F419 Anxiety disorder, unspecified: Secondary | ICD-10-CM | POA: Diagnosis present

## 2019-06-02 DIAGNOSIS — E039 Hypothyroidism, unspecified: Secondary | ICD-10-CM | POA: Diagnosis present

## 2019-06-02 DIAGNOSIS — Z79899 Other long term (current) drug therapy: Secondary | ICD-10-CM | POA: Diagnosis not present

## 2019-06-02 DIAGNOSIS — M797 Fibromyalgia: Secondary | ICD-10-CM | POA: Diagnosis present

## 2019-06-02 DIAGNOSIS — R531 Weakness: Secondary | ICD-10-CM | POA: Diagnosis present

## 2019-06-02 DIAGNOSIS — E785 Hyperlipidemia, unspecified: Secondary | ICD-10-CM | POA: Diagnosis present

## 2019-06-02 DIAGNOSIS — D61818 Other pancytopenia: Secondary | ICD-10-CM | POA: Diagnosis present

## 2019-06-02 DIAGNOSIS — Z800 Family history of malignant neoplasm of digestive organs: Secondary | ICD-10-CM | POA: Diagnosis not present

## 2019-06-02 DIAGNOSIS — K766 Portal hypertension: Secondary | ICD-10-CM | POA: Diagnosis present

## 2019-06-02 DIAGNOSIS — F329 Major depressive disorder, single episode, unspecified: Secondary | ICD-10-CM | POA: Diagnosis present

## 2019-06-02 DIAGNOSIS — Z833 Family history of diabetes mellitus: Secondary | ICD-10-CM | POA: Diagnosis not present

## 2019-06-02 DIAGNOSIS — E7800 Pure hypercholesterolemia, unspecified: Secondary | ICD-10-CM | POA: Diagnosis present

## 2019-06-02 DIAGNOSIS — M810 Age-related osteoporosis without current pathological fracture: Secondary | ICD-10-CM | POA: Diagnosis present

## 2019-06-02 DIAGNOSIS — Z90710 Acquired absence of both cervix and uterus: Secondary | ICD-10-CM | POA: Diagnosis not present

## 2019-06-02 DIAGNOSIS — E119 Type 2 diabetes mellitus without complications: Secondary | ICD-10-CM | POA: Diagnosis present

## 2019-06-02 DIAGNOSIS — K219 Gastro-esophageal reflux disease without esophagitis: Secondary | ICD-10-CM | POA: Diagnosis present

## 2019-06-02 DIAGNOSIS — Z801 Family history of malignant neoplasm of trachea, bronchus and lung: Secondary | ICD-10-CM | POA: Diagnosis not present

## 2019-06-02 DIAGNOSIS — D649 Anemia, unspecified: Secondary | ICD-10-CM | POA: Diagnosis not present

## 2019-06-02 DIAGNOSIS — Z794 Long term (current) use of insulin: Secondary | ICD-10-CM | POA: Diagnosis not present

## 2019-06-02 DIAGNOSIS — J9611 Chronic respiratory failure with hypoxia: Secondary | ICD-10-CM | POA: Diagnosis present

## 2019-06-02 DIAGNOSIS — K7581 Nonalcoholic steatohepatitis (NASH): Secondary | ICD-10-CM | POA: Diagnosis present

## 2019-06-02 DIAGNOSIS — I8510 Secondary esophageal varices without bleeding: Secondary | ICD-10-CM | POA: Diagnosis present

## 2019-06-02 DIAGNOSIS — Z9981 Dependence on supplemental oxygen: Secondary | ICD-10-CM | POA: Diagnosis not present

## 2019-06-02 HISTORY — PX: PORTACATH PLACEMENT: SHX2246

## 2019-06-02 LAB — GLUCOSE, CAPILLARY
Glucose-Capillary: 156 mg/dL — ABNORMAL HIGH (ref 70–99)
Glucose-Capillary: 154 mg/dL — ABNORMAL HIGH (ref 70–99)
Glucose-Capillary: 175 mg/dL — ABNORMAL HIGH (ref 70–99)
Glucose-Capillary: 188 mg/dL — ABNORMAL HIGH (ref 70–99)

## 2019-06-02 LAB — COMPREHENSIVE METABOLIC PANEL
ALT: 27 U/L (ref 0–44)
AST: 36 U/L (ref 15–41)
Albumin: 2.1 g/dL — ABNORMAL LOW (ref 3.5–5.0)
Alkaline Phosphatase: 88 U/L (ref 38–126)
Anion gap: 6 (ref 5–15)
BUN: 18 mg/dL (ref 8–23)
CO2: 21 mmol/L — ABNORMAL LOW (ref 22–32)
Calcium: 7.4 mg/dL — ABNORMAL LOW (ref 8.9–10.3)
Chloride: 108 mmol/L (ref 98–111)
Creatinine, Ser: 0.97 mg/dL (ref 0.44–1.00)
GFR calc Af Amer: >60 mL/min (ref >60)
GFR calc non Af Amer: >60 mL/min (ref >60)
Glucose, Bld: 186 mg/dL — ABNORMAL HIGH (ref 70–99)
Potassium: 2.9 mmol/L — ABNORMAL LOW (ref 3.5–5.1)
Sodium: 135 mmol/L (ref 135–145)
Total Bilirubin: 3 mg/dL — ABNORMAL HIGH (ref 0.3–1.2)
Total Protein: 4.7 g/dL — ABNORMAL LOW (ref 6.5–8.1)

## 2019-06-02 LAB — CBC
HCT: 26.7 % — ABNORMAL LOW (ref 36.0–46.0)
Hemoglobin: 8 g/dL — ABNORMAL LOW (ref 12.0–15.0)
MCH: 26.8 pg (ref 26.0–34.0)
MCHC: 30 g/dL (ref 30.0–36.0)
MCV: 89.6 fL (ref 80.0–100.0)
Platelets: 46 10*3/uL — ABNORMAL LOW (ref 150–400)
RBC: 2.98 MIL/uL — ABNORMAL LOW (ref 3.87–5.11)
RDW: 17.2 % — ABNORMAL HIGH (ref 11.5–15.5)
WBC: 3.3 10*3/uL — ABNORMAL LOW (ref 4.0–10.5)
nRBC: 0 % (ref 0.0–0.2)

## 2019-06-02 LAB — PROTIME-INR
INR: 1.6 — ABNORMAL HIGH (ref 0.8–1.2)
Prothrombin Time: 18.4 seconds — ABNORMAL HIGH (ref 11.4–15.2)

## 2019-06-02 LAB — CBC WITH DIFFERENTIAL/PLATELET
Abs Immature Granulocytes: 0.01 10*3/uL (ref 0.00–0.07)
Basophils Absolute: 0 10*3/uL (ref 0.0–0.1)
Basophils Relative: 1 %
Eosinophils Absolute: 0.1 10*3/uL (ref 0.0–0.5)
Eosinophils Relative: 2 %
HCT: 26.9 % — ABNORMAL LOW (ref 36.0–46.0)
Hemoglobin: 8.1 g/dL — ABNORMAL LOW (ref 12.0–15.0)
Immature Granulocytes: 0 %
Lymphocytes Relative: 22 %
Lymphs Abs: 0.8 10*3/uL (ref 0.7–4.0)
MCH: 27.2 pg (ref 26.0–34.0)
MCHC: 30.1 g/dL (ref 30.0–36.0)
MCV: 90.3 fL (ref 80.0–100.0)
Monocytes Absolute: 0.4 10*3/uL (ref 0.1–1.0)
Monocytes Relative: 11 %
Neutro Abs: 2.2 10*3/uL (ref 1.7–7.7)
Neutrophils Relative %: 64 %
Platelets: 48 10*3/uL — ABNORMAL LOW (ref 150–400)
RBC: 2.98 MIL/uL — ABNORMAL LOW (ref 3.87–5.11)
RDW: 17.2 % — ABNORMAL HIGH (ref 11.5–15.5)
WBC: 3.5 10*3/uL — ABNORMAL LOW (ref 4.0–10.5)
nRBC: 0 % (ref 0.0–0.2)

## 2019-06-02 LAB — HEMOGLOBIN AND HEMATOCRIT, BLOOD
HCT: 27.9 % — ABNORMAL LOW (ref 36.0–46.0)
Hemoglobin: 8.4 g/dL — ABNORMAL LOW (ref 12.0–15.0)

## 2019-06-02 LAB — SURGICAL PCR SCREEN
MRSA, PCR: NEGATIVE
Staphylococcus aureus: NEGATIVE

## 2019-06-02 LAB — PREPARE RBC (CROSSMATCH)

## 2019-06-02 SURGERY — INSERTION, TUNNELED CENTRAL VENOUS DEVICE, WITH PORT
Anesthesia: General | Site: Chest | Laterality: Right

## 2019-06-02 MED ORDER — FENTANYL CITRATE (PF) 100 MCG/2ML IJ SOLN
25.0000 ug | INTRAMUSCULAR | Status: DC | PRN
  Administered 2019-06-02: 25 ug via INTRAVENOUS
  Filled 2019-06-02: qty 2

## 2019-06-02 MED ORDER — PROPOFOL 500 MG/50ML IV EMUL
INTRAVENOUS | Status: DC | PRN
  Administered 2019-06-02: 75 ug/kg/min via INTRAVENOUS

## 2019-06-02 MED ORDER — PROPOFOL 10 MG/ML IV BOLUS
INTRAVENOUS | Status: DC | PRN
  Administered 2019-06-02 (×2): 20 mg via INTRAVENOUS

## 2019-06-02 MED ORDER — POTASSIUM CHLORIDE CRYS ER 20 MEQ PO TBCR
40.0000 meq | EXTENDED_RELEASE_TABLET | Freq: Once | ORAL | Status: AC
  Administered 2019-06-02: 40 meq via ORAL
  Filled 2019-06-02: qty 2

## 2019-06-02 MED ORDER — MIDAZOLAM HCL 2 MG/2ML IJ SOLN
INTRAMUSCULAR | Status: AC
  Filled 2019-06-02: qty 2

## 2019-06-02 MED ORDER — HEPARIN SOD (PORK) LOCK FLUSH 100 UNIT/ML IV SOLN
INTRAVENOUS | Status: DC | PRN
  Administered 2019-06-02: 500 [IU] via INTRAVENOUS

## 2019-06-02 MED ORDER — POTASSIUM CHLORIDE CRYS ER 20 MEQ PO TBCR: 40.0000 meq | EXTENDED_RELEASE_TABLET | Freq: Every day | ORAL | 2 refills | Status: DC

## 2019-06-02 MED ORDER — CEFAZOLIN SODIUM-DEXTROSE 2-4 GM/100ML-% IV SOLN: 2.0000 g | INTRAVENOUS | Status: DC

## 2019-06-02 MED ORDER — CEFAZOLIN SODIUM-DEXTROSE 2-4 GM/100ML-% IV SOLN
2.0000 g | INTRAVENOUS | Status: AC
  Administered 2019-06-02: 2 g via INTRAVENOUS

## 2019-06-02 MED ORDER — CEFAZOLIN SODIUM-DEXTROSE 2-4 GM/100ML-% IV SOLN
INTRAVENOUS | Status: AC
  Filled 2019-06-02: qty 100

## 2019-06-02 MED ORDER — CHLORHEXIDINE GLUCONATE CLOTH 2 % EX PADS: 6.0000 | MEDICATED_PAD | Freq: Once | CUTANEOUS | Status: DC

## 2019-06-02 MED ORDER — LACTATED RINGERS IV SOLN
Freq: Once | INTRAVENOUS | Status: AC
  Administered 2019-06-02: 11:00:00 via INTRAVENOUS

## 2019-06-02 MED ORDER — ATENOLOL 25 MG PO TABS: 12.5000 mg | ORAL_TABLET | Freq: Every day | ORAL | 2 refills | Status: DC

## 2019-06-02 MED ORDER — HEPARIN SOD (PORK) LOCK FLUSH 100 UNIT/ML IV SOLN
INTRAVENOUS | Status: AC
  Filled 2019-06-02: qty 5

## 2019-06-02 MED ORDER — MEPERIDINE HCL 50 MG/ML IJ SOLN: 6.2500 mg | INTRAMUSCULAR | Status: DC | PRN

## 2019-06-02 MED ORDER — PANTOPRAZOLE SODIUM 40 MG PO TBEC: 40.0000 mg | DELAYED_RELEASE_TABLET | Freq: Two times a day (BID) | ORAL | 5 refills | Status: DC

## 2019-06-02 MED ORDER — POTASSIUM CHLORIDE 20 MEQ PO PACK
40.0000 meq | PACK | Freq: Once | ORAL | Status: AC
  Administered 2019-06-02: 40 meq via ORAL
  Filled 2019-06-02: qty 2

## 2019-06-02 MED ORDER — LACTATED RINGERS IV SOLN
INTRAVENOUS | Status: DC | PRN
  Administered 2019-06-02: 11:00:00 via INTRAVENOUS

## 2019-06-02 MED ORDER — SODIUM CHLORIDE (PF) 0.9 % IJ SOLN
INTRAMUSCULAR | Status: DC | PRN
  Administered 2019-06-02: 10 mL via INTRAVENOUS

## 2019-06-02 MED ORDER — FUROSEMIDE 10 MG/ML IJ SOLN
20.0000 mg | Freq: Once | INTRAMUSCULAR | Status: AC
  Administered 2019-06-02: 20 mg via INTRAVENOUS

## 2019-06-02 MED ORDER — ONDANSETRON HCL 4 MG/2ML IJ SOLN: 4.0000 mg | Freq: Once | INTRAMUSCULAR | Status: DC | PRN

## 2019-06-02 MED ORDER — LIDOCAINE HCL (PF) 1 % IJ SOLN
INTRAMUSCULAR | Status: AC
  Filled 2019-06-02: qty 30

## 2019-06-02 MED ORDER — LIDOCAINE HCL (PF) 1 % IJ SOLN
INTRAMUSCULAR | Status: DC | PRN
  Administered 2019-06-02: 9 mL

## 2019-06-02 MED ORDER — LIDOCAINE HCL (PF) 1 % IJ SOLN
INTRAMUSCULAR | Status: AC
  Filled 2019-06-02: qty 2

## 2019-06-02 SURGICAL SUPPLY — 32 items
ADH SKN CLS APL DERMABOND .7 (GAUZE/BANDAGES/DRESSINGS) ×1
APL PRP STRL LF ISPRP CHG 10.5 (MISCELLANEOUS) ×1
APPLICATOR CHLORAPREP 10.5 ORG (MISCELLANEOUS) ×3 IMPLANT
BAG DECANTER FOR FLEXI CONT (MISCELLANEOUS) ×3 IMPLANT
CLOTH BEACON ORANGE TIMEOUT ST (SAFETY) ×3 IMPLANT
COVER LIGHT HANDLE STERIS (MISCELLANEOUS) ×6 IMPLANT
COVER WAND RF STERILE (DRAPES) ×3 IMPLANT
DECANTER SPIKE VIAL GLASS SM (MISCELLANEOUS) ×3 IMPLANT
DERMABOND ADVANCED (GAUZE/BANDAGES/DRESSINGS) ×2
DERMABOND ADVANCED .7 DNX12 (GAUZE/BANDAGES/DRESSINGS) ×1 IMPLANT
DRAPE C-ARM FOLDED MOBILE STRL (DRAPES) ×3 IMPLANT
ELECT REM PT RETURN 9FT ADLT (ELECTROSURGICAL) ×3
ELECTRODE REM PT RTRN 9FT ADLT (ELECTROSURGICAL) ×1 IMPLANT
GLOVE BIOGEL PI IND STRL 7.0 (GLOVE) ×2 IMPLANT
GLOVE BIOGEL PI INDICATOR 7.0 (GLOVE) ×4
GLOVE ECLIPSE 6.5 STRL STRAW (GLOVE) ×2 IMPLANT
GLOVE SURG SS PI 7.5 STRL IVOR (GLOVE) ×3 IMPLANT
GOWN STRL REUS W/TWL LRG LVL3 (GOWN DISPOSABLE) ×6 IMPLANT
IV NS 500ML (IV SOLUTION) ×3
IV NS 500ML BAXH (IV SOLUTION) ×1 IMPLANT
KIT PORT POWER 8FR ISP MRI (Port) ×3 IMPLANT
KIT TURNOVER KIT A (KITS) ×3 IMPLANT
NDL HYPO 25X1 1.5 SAFETY (NEEDLE) ×1 IMPLANT
NEEDLE HYPO 25X1 1.5 SAFETY (NEEDLE) ×3 IMPLANT
PACK MINOR (CUSTOM PROCEDURE TRAY) ×3 IMPLANT
PAD ARMBOARD 7.5X6 YLW CONV (MISCELLANEOUS) ×3 IMPLANT
SET BASIN LINEN APH (SET/KITS/TRAYS/PACK) ×3 IMPLANT
SUT MNCRL AB 4-0 PS2 18 (SUTURE) ×3 IMPLANT
SUT VIC AB 3-0 SH 27 (SUTURE) ×3
SUT VIC AB 3-0 SH 27X BRD (SUTURE) ×1 IMPLANT
SYR 5ML LL (SYRINGE) ×3 IMPLANT
SYR CONTROL 10ML LL (SYRINGE) ×3 IMPLANT

## 2019-06-02 NOTE — Transfer of Care (Signed)
Immediate Anesthesia Transfer of Care Note  Patient: Maria [name]  Procedure(s) Performed: INSERTION PORT-A-CATH (Right Chest)  Patient Location: PACU  Anesthesia Type:MAC  Level of Consciousness: awake  Airway & Oxygen Therapy: Patient Spontanous Breathing  Post-op Assessment: Report given to RN  Post vital signs: Reviewed  Last Vitals:  Vitals Value Taken Time  BP    Temp    Pulse 78 06/02/19 1145  Resp 17 06/02/19 1145  SpO2 99 % 06/02/19 1145  Vitals shown include unvalidated device data.  Last Pain:  Vitals:   06/02/19 1041  TempSrc: Oral  PainSc: 0-No pain         Complications: No apparent anesthesia complications

## 2019-06-02 NOTE — Discharge Instructions (Signed)
1)Avoid ibuprofen/Advil/Aleve/Motrin/Goody Powders/Naproxen/BC powders/Meloxicam/Diclofenac/Indomethacin and other Nonsteroidal anti-inflammatory medications as these will make you more likely to bleed and can cause stomach ulcers, can also cause Kidney problems.  2) follow-up with Dr. Laural Golden your gastroenterologist as previously advised- 3)you need repeat CBC/complete blood count this Friday, 06/06/2019 due to low platelets and low hemoglobin 4)You also need repeat BMP test due to persistently low potassium, magnesium test needs to be added also on 06/06/2019     Implanted [place] An implanted port is a device that is placed under the skin. It is usually placed in the chest. The device can be used to give IV medicine, to take blood, or for dialysis. You may have an implanted port if:  You need IV medicine that would be irritating to the small veins in your hands or arms.  You need IV medicines, such as antibiotics, for a long period of time.  You need IV nutrition for a long period of time.  You need dialysis. Having a port means that your health care provider will not need to use the veins in your arms for these procedures. You may have fewer limitations when using a port than you would if you used other types of long-term IVs, and you will likely be able to return to normal activities after your incision heals. An implanted port has two main parts:  Reservoir. The reservoir is the part where a needle is inserted to give medicines or draw blood. The reservoir is round. After it is placed, it appears as a small, raised area under your skin.  Catheter. The catheter is a thin, flexible tube that connects the reservoir to a vein. Medicine that is inserted into the reservoir goes into the catheter and then into the vein. How is my port accessed? To access your port:  A numbing cream may be placed on the skin over the port site.  Your health care provider will put on a mask and sterile  gloves.  The skin over your port will be cleaned carefully with a germ-killing soap and allowed to dry.  Your health care provider will gently pinch the port and insert a needle into it.  Your health care provider will check for a blood return to make sure the port is in the vein and is not clogged.  If your port needs to remain accessed to get medicine continuously (constant infusion), your health care provider will place a clear bandage (dressing) over the needle site. The dressing and needle will need to be changed every week, or as told by your health care provider. What is flushing? Flushing helps keep the port from getting clogged. Follow instructions from your health care provider about how and when to flush the port. Ports are usually flushed with saline solution or a medicine called heparin. The need for flushing will depend on how the port is used:  If the port is only used from time to time to give medicines or draw blood, the port may need to be flushed: ? Before and after medicines have been given. ? Before and after blood has been drawn. ? As part of routine maintenance. Flushing may be recommended every 4-6 weeks.  If a constant infusion is running, the port may not need to be flushed.  Throw away any syringes in a disposal container that is meant for sharp items (sharps container). You can buy a sharps container from a pharmacy, or you can make one by using an empty hard plastic  bottle with a cover. How long will my port stay implanted? The port can stay in for as long as your health care provider thinks it is needed. When it is time for the port to come out, a surgery will be done to remove it. The surgery will be similar to the procedure that was done to put the port in. Follow these instructions at home:   Flush your port as told by your health care provider.  If you need an infusion over several days, follow instructions from your health care provider about how to take  care of your port site. Make sure you: ? Wash your hands with soap and water before you change your dressing. If soap and water are not available, use alcohol-based hand sanitizer. ? Change your dressing as told by your health care provider. ? Place any used dressings or infusion bags into a plastic bag. Throw that bag in the trash. ? Keep the dressing that covers the needle clean and dry. Do not get it wet. ? Do not use scissors or sharp objects near the tube. ? Keep the tube clamped, unless it is being used.  Check your port site every day for signs of infection. Check for: ? Redness, swelling, or pain. ? Fluid or blood. ? Pus or a bad smell.  Protect the skin around the port site. ? Avoid wearing bra straps that rub or irritate the site. ? Protect the skin around your port from seat belts. Place a soft pad over your chest if needed.  Bathe or shower as told by your health care provider. The site may get wet as long as you are not actively receiving an infusion.  Return to your normal activities as told by your health care provider. Ask your health care provider what activities are safe for you.  Carry a medical alert card or wear a medical alert bracelet at all times. This will let health care providers know that you have an implanted port in case of an emergency. Get help right away if:  You have redness, swelling, or pain at the port site.  You have fluid or blood coming from your port site.  You have pus or a bad smell coming from the port site.  You have a fever. Summary  Implanted ports are usually placed in the chest for long-term IV access.  Follow instructions from your health care provider about flushing the port and changing bandages (dressings).  Take care of the area around your port by avoiding clothing that puts pressure on the area, and by watching for signs of infection.  Protect the skin around your port from seat belts. Place a soft pad over your chest if  needed.  Get help right away if you have a fever or you have redness, swelling, pain, drainage, or a bad smell at the port site. This information is not intended to replace advice given to you by your health care provider. Make sure you discuss any questions you have with your health care provider. Document Released: 09/04/2005 Document Revised: 12/27/2018 Document Reviewed: 10/07/2016 Elsevier Patient Education  2020 [place].   --- 1)Avoid ibuprofen/Advil/Aleve/Motrin/Goody Powders/Naproxen/BC powders/Meloxicam/Diclofenac/Indomethacin and other Nonsteroidal anti-inflammatory medications as these will make you more likely to bleed and can cause stomach ulcers, can also cause Kidney problems.  2) follow-up with Dr. Laural Golden your gastroenterologist as previously advised- 3)you need repeat CBC/complete blood count this Friday, 06/06/2019 due to low platelets and low hemoglobin 4)You also need repeat BMP  test due to persistently low potassium, magnesium test needs to be added also on 06/06/2019

## 2019-06-02 NOTE — Interval H&P Note (Signed)
History and Physical Interval Note:  06/02/2019 10:27 AM  Maria [name]  has presented today for surgery, with the diagnosis of anemia.  The various methods of treatment have been discussed with the patient and family. After consideration of risks, benefits and other options for treatment, the patient has consented to  Procedure(s): INSERTION PORT-A-CATH (N/A) as a surgical intervention.  The patient's history has been reviewed, patient examined, no change in status, stable for surgery.  I have reviewed the patient's chart and labs.  Questions were answered to the patient's satisfaction.     Aviva Signs

## 2019-06-02 NOTE — Discharge Summary (Addendum)
Maria [name], is a 62 y.o. female  DOB 1956-02-06  MRN [id number].  Admission date:  05/31/2019  Admitting Physician  Jani Gravel, MD  Discharge Date:  06/02/2019   Primary MD  Glenda Chroman, MD  Recommendations for primary care physician for things to follow:    1)Avoid ibuprofen/Advil/Aleve/Motrin/Goody Powders/Naproxen/BC powders/Meloxicam/Diclofenac/Indomethacin and other Nonsteroidal anti-inflammatory medications as these will make you more likely to bleed and can cause stomach ulcers, can also cause Kidney problems.  2) follow-up with Dr. Laural Golden your gastroenterologist as previously advised- 3)you need repeat CBC/complete blood count this Friday, 06/06/2019 due to low platelets and low hemoglobin 4)You also need repeat BMP test due to persistently low potassium, magnesium test needs to be added also on 06/06/2019  Admission Diagnosis  Pancytopenia ([place]) [D61.818] Symptomatic anemia [D64.9]  Discharge Diagnosis  Pancytopenia ([place]) [D61.818] Symptomatic anemia [D64.9]    Principal Problem:   Symptomatic anemia Active Problems:   Esophageal varices in cirrhosis (HCC)   Cirrhosis, nonalcoholic (HCC)   Esophageal varices (HCC)   Hepatic cirrhosis (HCC)   GAVE (gastric antral vascular ectasia)   Hypothyroidism   Insulin dependent diabetes mellitus ([place])   Acute on chronic anemia     Past Medical History:  Diagnosis Date   Anemia    CHF (congestive heart failure) (HCC)    Cirrhosis (HCC)    Depression    Diabetes mellitus    Esophageal bleed, non-variceal    Eye hemorrhage    Behind left eye   Fibromyalgia    Hypercholesteremia    Hypertension    Hypothyroidism    NAFLD (nonalcoholic fatty liver disease)    Osteoarthrosis    Osteoporosis    PONV (postoperative nausea and vomiting)    UTI (urinary tract infection) 11/12 end of the month   Patient feels that she passed  a kidney stone at that time    Past Surgical History:  Procedure Laterality Date   APPENDECTOMY  1980   BILATERAL SALPINGOOPHORECTOMY     CHOLECYSTECTOMY     COLONOSCOPY  03/15/2011   COLONOSCOPY N/A 03/24/2016   Procedure: COLONOSCOPY;  Surgeon: [name], MD;  [facility];  Service: Endoscopy;  Laterality: N/A;  855   ESOPHAGEAL BANDING  04/24/2012   Procedure: ESOPHAGEAL BANDING;  Surgeon: [name], MD;  [facility];  Service: Endoscopy;  Laterality: N/A;   Esophageal BANDING  08/03/2012   [facility] [place] , [place]  09/24/2012   Procedure: ESOPHAGEAL BANDING;  Surgeon: [name], MD;  [facility]: [facility];  Service: Endoscopy;  Laterality: N/A;  Banding x 3   ESOPHAGEAL BANDING N/A 01/29/2013   Procedure: ESOPHAGEAL BANDING;  Surgeon: [name], MD;  [facility];  Service: Endoscopy;  Laterality: N/A;   ESOPHAGEAL BANDING N/A 06/19/2014   Procedure: ESOPHAGEAL BANDING;  Surgeon: [name], MD;  [facility];  Service: Endoscopy;  Laterality: N/A;   ESOPHAGEAL BANDING N/A 01/05/2016  Procedure: ESOPHAGEAL BANDING;  Surgeon: [name], MD;  [facility];  Service: Endoscopy;  Laterality: N/A;   ESOPHAGEAL BANDING N/A 08/03/2016   Procedure: ESOPHAGEAL BANDING;  Surgeon: [name], MD;  [facility];  Service: Endoscopy;  Laterality: N/A;   ESOPHAGEAL BANDING N/A 05/02/2017   Procedure: ESOPHAGEAL BANDING;  Surgeon: [name], MD;  [facility];  Service: Endoscopy;  Laterality: N/A;   ESOPHAGOGASTRODUODENOSCOPY  04/24/2012   Procedure: ESOPHAGOGASTRODUODENOSCOPY (EGD);  Surgeon: [name], MD;  [facility];  Service: Endoscopy;  Laterality: N/A;  300   ESOPHAGOGASTRODUODENOSCOPY N/A 01/29/2013   Procedure: ESOPHAGOGASTRODUODENOSCOPY (EGD);  Surgeon: [name], MD;  [facility];   Service: Endoscopy;  Laterality: N/A;  1200   ESOPHAGOGASTRODUODENOSCOPY N/A 05/22/2013   Procedure: ESOPHAGOGASTRODUODENOSCOPY (EGD);  Surgeon: [name], MD;  [facility];  Service: Endoscopy;  Laterality: N/A;  1:55   ESOPHAGOGASTRODUODENOSCOPY N/A 12/31/2013   Procedure: ESOPHAGOGASTRODUODENOSCOPY (EGD);  Surgeon: [name], MD;  [facility];  Service: Endoscopy;  Laterality: N/A;  1200   ESOPHAGOGASTRODUODENOSCOPY N/A 06/19/2014   Procedure: ESOPHAGOGASTRODUODENOSCOPY (EGD);  Surgeon: [name], MD;  [facility];  Service: Endoscopy;  Laterality: N/A;  1055   ESOPHAGOGASTRODUODENOSCOPY N/A 01/15/2015   Procedure: ESOPHAGOGASTRODUODENOSCOPY (EGD);  Surgeon: [name], MD;  [facility];  Service: Endoscopy;  Laterality: N/A;  730 - moved to 9:45 - moved to 1250-Ann notified pt   ESOPHAGOGASTRODUODENOSCOPY N/A 06/30/2015   Procedure: ESOPHAGOGASTRODUODENOSCOPY (EGD);  Surgeon: [name], MD;  [facility];  Service: Endoscopy;  Laterality: N/A;  1200   ESOPHAGOGASTRODUODENOSCOPY N/A 01/05/2016   Procedure: ESOPHAGOGASTRODUODENOSCOPY (EGD);  Surgeon: [name], MD;  [facility];  Service: Endoscopy;  Laterality: N/A;  830   ESOPHAGOGASTRODUODENOSCOPY N/A 08/03/2016   Procedure: ESOPHAGOGASTRODUODENOSCOPY (EGD);  Surgeon: [name], MD;  [facility];  Service: Endoscopy;  Laterality: N/A;  930   ESOPHAGOGASTRODUODENOSCOPY N/A 05/02/2017   Procedure: ESOPHAGOGASTRODUODENOSCOPY (EGD);  Surgeon: [name], MD;  [facility];  Service: Endoscopy;  Laterality: N/A;  12:00   ESOPHAGOGASTRODUODENOSCOPY N/A 08/17/2017   Procedure: ESOPHAGOGASTRODUODENOSCOPY (EGD);  Surgeon: [name], MD;  [facility];  Service: Endoscopy;  Laterality: N/A;  8:15   ESOPHAGOGASTRODUODENOSCOPY N/A 09/12/2017   Procedure: ESOPHAGOGASTRODUODENOSCOPY (EGD);   Surgeon: [name], MD;  [facility];  Service: Endoscopy;  Laterality: N/A;  1040   ESOPHAGOGASTRODUODENOSCOPY N/A 10/10/2017   Procedure: ESOPHAGOGASTRODUODENOSCOPY (EGD);  Surgeon: [name], MD;  [facility];  Service: Endoscopy;  Laterality: N/A;  225   ESOPHAGOGASTRODUODENOSCOPY N/A 11/09/2017   Procedure: ESOPHAGOGASTRODUODENOSCOPY (EGD);  Surgeon: [name], MD;  [facility];  Service: Endoscopy;  Laterality: N/A;   ESOPHAGOGASTRODUODENOSCOPY (EGD) WITH PROPOFOL  09/24/2012   Procedure: ESOPHAGOGASTRODUODENOSCOPY (EGD) WITH PROPOFOL;  Surgeon: [name], MD;  [facility];  Service: Endoscopy;  Laterality: N/A;  GE junction at 36   ESOPHAGOGASTRODUODENOSCOPY (EGD) WITH PROPOFOL N/A 07/06/2017   Procedure: ESOPHAGOGASTRODUODENOSCOPY (EGD) WITH PROPOFOL;  Surgeon: [name], MD;  [facility];  Service: Endoscopy;  Laterality: N/A;   ESOPHAGOGASTRODUODENOSCOPY W/ BANDING  08/2010   GIVENS CAPSULE STUDY N/A 07/05/2017   Procedure: GIVENS CAPSULE STUDY;  Surgeon: [name], MD;  [facility];  Service: Endoscopy;  Laterality: N/A;   HOT  HEMOSTASIS N/A 08/17/2017   Procedure: HOT HEMOSTASIS (ARGON PLASMA COAGULATION/BICAP);  Surgeon: [name], MD;  [facility];  Service: Endoscopy;  Laterality: N/A;   HOT HEMOSTASIS  09/12/2017   Procedure: HOT HEMOSTASIS (ARGON PLASMA COAGULATION/BICAP);  Surgeon: [name], MD;  [facility];  Service: Endoscopy;;  gastric   HOT HEMOSTASIS  10/10/2017   Procedure: HOT HEMOSTASIS (ARGON PLASMA COAGULATION/BICAP);  Surgeon: [name], MD;  [facility];  Service: Endoscopy;;   POLYPECTOMY  03/24/2016   Procedure: POLYPECTOMY;  Surgeon: [name], MD;  [facility];  Service: Endoscopy;;  sigmoid polyp   RIGHT HEART CATH N/A 04/16/2017   Procedure: Right Heart Cath;  Surgeon: [name], MD;  Location: [facility];  Service: Cardiovascular;  Laterality: N/A;   RIGHT HEART CATH N/A 07/12/2017   Procedure: RIGHT HEART CATH;  Surgeon: [name], MD;  Location: [facility];  Service: Cardiovascular;  Laterality: N/A;   TONSILLECTOMY     UPPER GASTROINTESTINAL ENDOSCOPY  03/15/2011   EGD ED BANDING/TCS   UPPER GASTROINTESTINAL ENDOSCOPY  09/05/2010   UPPER GASTROINTESTINAL ENDOSCOPY  08/11/2010   VAGINAL HYSTERECTOMY         HPI  from the history and physical done on the day of admission:    - Violia Knopf  is a 63 y.o. female,  w Hypothyroidism, Depression, Fibromyalgia, h/o eye hemorrhage, Hypertension, Hyperlipidemia, Dm2, CHF NASH cirrhosis w esophageal varices, GAVE, s/p recent EGD 05/23/2019 at [facility] by Leafy Half tx with APC  presents with c/o feeling weak, and slight dyspnea on exertion. Pt denies abd pain, n/v, hematemesis, brbpr.  Pt has baseline anemia and her blood counts were checked by Dr. Laural Golden, and sent to ED for worsening anemia. Pt has portacath placement scheduled and is wondering if can have done during this admission.   In ED,  T 98.2, P 80 R 22, Bp 132/50  Pox 100% on RA Wt 95.3kg  Na 138, K 4.1, Bun 21, Creatinine 0.96 Alb 2.2 Ast 41, Alt 31, Alk phos 92, T. Bili 2.9 Glucose 196 Wbc 4.2, Hgb 5.9, Plt 71  Pt will be admitted for symptomatic anemia and also portacath placement.    Hospital Course:    -Brief Summary:- 63 y.o.female,w Hypothyroidism, Depression, Fibromyalgia, h/o eye hemorrhage, Hypertension, Hyperlipidemia, Dm2, CHF NASH cirrhosis w esophageal varices, GAVE, s/p recent EGD 05/23/2019 at [facility] by Leafy Half tx with APC  admitted on 06/01/2019 with symptomatic acute on chronic anemia Pt and husband states she does not want to take Propranolol or  Atenolol for portal HTN.. she will hold rx for Atenolol until she sees Dr. Laural Golden as outpatient and dw him  A/p 1)Symptomatic Acute on  Chronic Anemia--- due to ongoing chronic GI blood loss, patient with longstanding history of GI bleed secondary to GAVE/esophageal varices in the setting of [place] related liver cirrhosis -Patient hemoglobin is usually close to 8,, admitted 06/01/2019 with hemoglobin of 5.9 associated with shortness of breath, fatigue and dizziness as well as dyspnea on exertion--patient received 1 unit of packed cells -Posttransfusion hemoglobin up to 8.1 from 5.4 after 3 units of packed cells --Dyspnea on exertion and dizziness has improved significantly -Change Protonix to 40 twice daily,  -Repeat CBC on 06/06/2019 given persistent anemia and thrombocytopenia in the setting of ongoing GI bleed and liver cirrhosis  2) difficult IV access--given limited IV access and need for frequent transfusions, appreciate surgical  input -patient is status post Port-A-Cath placement on 06/02/2019  3)DM--- no recent A1c available, continue Lantus insulin and short-acting insulin per home regimen   4) Nash related liver cirrhosis--continue lactulose and rifaximin, continue Aldactone and add low-dose atenolol -Follow-up with Dr. Laural Golden, patient and husband interested in referral to liver transplant program Meld score is 18, with 6% 21-monthmortality  -Pt and husband states she does not want to take Propranolol or  Atenolol for portal HTN.. she will hold rx for Atenolol until she sees Dr. RLaural Goldenas outpatient and dw him   5)Hypothyroidism--stable continue levothyroxine 25 mcg daily  6)Depression and Anxiety--- stable, continue Zoloft 50 mg daily and buspirone  7) chronic hypoxic respiratory failure--- stable, continue supplemental oxygen at 2 L/min nightly  8) persistent hypokalemia and hypomagnesemia--- due to GI losses in the patient with liver cirrhosis on lactulose as well as renal losses due to need for torsemide diuretic--- continue magnesium and potassium supplementation -Repeat BMP and serum magnesium on Friday,  06/06/2019   Code Status : Full  Family Communication:   (patient is alert, awake and coherent) Discussed with Husband at bedside  Disposition Plan  : Home   Consults  : General surgeon for Port-A-Cath placement/curbside conversations with patient's gastroenterologist Dr. RLaural Golden Discharge Condition: stable  Follow UP  Follow-up Information    JAviva Signs MD.   Specialty: General Surgery Why: As needed Contact information: [address] [phone]          Diet and Activity recommendation:  As advised  Discharge Instructions    Discharge Instructions    Call MD for:  difficulty breathing, headache or visual disturbances   Complete by: As directed    Call MD for:  extreme fatigue   Complete by: As directed    Call MD for:  persistant dizziness or light-headedness   Complete by: As directed    Call MD for:  persistant nausea and vomiting   Complete by: As directed    Call MD for:  temperature >100.4   Complete by: As directed    Diet - low sodium heart healthy   Complete by: As directed    Diet Carb Modified   Complete by: As directed    Discharge instructions   Complete by: As directed    1)Avoid ibuprofen/Advil/Aleve/Motrin/Goody Powders/Naproxen/BC powders/Meloxicam/Diclofenac/Indomethacin and other Nonsteroidal anti-inflammatory medications as these will make you more likely to bleed and can cause stomach ulcers, can also cause Kidney problems.  2) follow-up with Dr. RLaural Goldenyour gastroenterologist as previously advised- 3)you need repeat CBC/complete blood count this Friday, 06/06/2019 due to low platelets and low hemoglobin 4)You also need repeat BMP test due to persistently low potassium, magnesium test needs to be added also on 06/06/2019   Increase activity slowly   Complete by: As directed         Discharge Medications     Allergies as of 06/02/2019      Reactions   Ferumoxytol Other (See Comments)   Patient  has severe back and chest pain when receiving FERAHEME infusions.   Tramadol Hcl Other (See Comments)   WEAKNESS      Medication List    STOP taking these medications   potassium chloride 10 MEQ tablet Commonly known as: K-DUR Replaced by: potassium chloride SA 20 MEQ tablet     TAKE these medications   busPIRone 10 MG tablet Commonly known as: BUSPAR Take 10 mg by mouth 2 (two) times daily.  cholecalciferol 1000 units tablet Commonly known as: VITAMIN D Take 1,000 Units by mouth daily.   FLINTSTONES PLUS IRON PO Take 2 tablets by mouth daily.   HumaLOG 100 UNIT/ML injection Generic drug: insulin lispro Inject 15-35 Units into the skin 3 (three) times daily with meals. Sliding scale insulin 100-150= 15 units 150-200= 20 units 250-300= 30 units 300-350= 35 units   Insulin Pen Needle 31G X 5 MM Misc Use with each insulin injection   lactulose 10 GM/15ML solution Commonly known as: CHRONULAC Take 45 mLs (30 g total) by mouth 3 (three) times daily. What changed:   when to take this  additional instructions   Lantus SoloStar 100 UNIT/ML Solostar Pen Generic drug: Insulin Glargine Inject 10-55 Units into the skin See admin instructions. Inject 55 units subcutaneously in the morning & 10 units subcutaneously at night.   levothyroxine 25 MCG tablet Commonly known as: SYNTHROID Take 25 mcg by mouth daily before breakfast.   magnesium oxide 400 MG tablet Commonly known as: MAG-OX Take 400 mg by mouth daily.   OXYGEN Inhale 2 L into the lungs at bedtime.   pantoprazole 40 MG tablet Commonly known as: PROTONIX Take 1 tablet (40 mg total) by mouth 2 (two) times daily before a meal. Start taking on: June 03, 2019 What changed: when to take this   potassium chloride SA 20 MEQ tablet Commonly known as: K-DUR Take 2 tablets (40 mEq total) by mouth daily. Start taking on: June 03, 2019 Replaces: potassium chloride 10 MEQ tablet   ReliOn Insulin Syringe  31G X 15/64" 0.5 ML Misc Generic drug: Insulin Syringe-Needle U-100 USE 1 SYRINGE THREE TIMES DAILY AS DIRECTED   rifaximin 550 MG Tabs tablet Commonly known as: XIFAXAN Take 1 tablet (550 mg total) by mouth 2 (two) times daily.   rosuvastatin 5 MG tablet Commonly known as: CRESTOR Take 5 mg by mouth every other day.   sertraline 50 MG tablet Commonly known as: ZOLOFT Take 50 mg by mouth daily.   spironolactone 50 MG tablet Commonly known as: ALDACTONE TAKE 1 TABLET BY MOUTH TWICE DAILY What changed: additional instructions   sucralfate 1 GM/10ML suspension Commonly known as: CARAFATE Take 1 g by mouth 4 (four) times daily.   torsemide 20 MG tablet Commonly known as: DEMADEX Take 1 tablet by mouth once daily What changed: when to take this   trimethoprim-polymyxin b ophthalmic solution Commonly known as: POLYTRIM Place 1 drop into the left eye See admin instructions. Use 3 to 4 times daily for 2 days following monthly eye injection   zinc sulfate 220 (50 Zn) MG capsule Take 1 capsule (220 mg total) by mouth daily.       Major procedures and Radiology Reports - PLEASE review detailed and final reports for all details, in brief -   Dg Chest Port 1 View  Result Date: 06/02/2019 CLINICAL DATA:  Port-A-Cath placement. History of hypertension, diabetes and cirrhosis. EXAM: PORTABLE CHEST 1 VIEW COMPARISON:  Radiographs 11/21/2016.  CT 02/08/2017. FINDINGS: 1151 hours. Right subclavian Port-A-Cath tip terminates at the mid SVC level. The heart size and mediastinal contours are stable. The lungs are clear. There is no pleural effusion or pneumothorax. The bones appear unremarkable. IMPRESSION: Port-A-Cath tip at the mid SVC level.  No evidence of pneumothorax. Electronically Signed   By: Richardean Sale M.D.   On: 06/02/2019 12:09   Dg C-arm 1-60 Min-no Report  Result Date: 06/02/2019 Fluoroscopy was utilized by the requesting physician.  No radiographic  interpretation.     Micro Results    Recent Results (from the past 240 hour(s))  SARS Coronavirus 2 [facility] order, Performed in [facility] hospital lab) Nasopharyngeal Nasopharyngeal Swab     Status: None   Collection Time: 06/01/19  1:34 AM   Specimen: Nasopharyngeal Swab  Result Value Ref Range Status   SARS Coronavirus 2 NEGATIVE NEGATIVE Final    Comment: (NOTE) If result is NEGATIVE SARS-CoV-2 target nucleic acids are NOT DETECTED. The SARS-CoV-2 RNA is generally detectable in upper and lower  respiratory specimens during the acute phase of infection. The lowest  concentration of SARS-CoV-2 viral copies this assay can detect is 250  copies / mL. A negative result does not preclude SARS-CoV-2 infection  and should not be used as the sole basis for treatment or other  patient management decisions.  A negative result may occur with  improper specimen collection / handling, submission of specimen other  than nasopharyngeal swab, presence of viral mutation(s) within the  areas targeted by this assay, and inadequate number of viral copies  (<250 copies / mL). A negative result must be combined with clinical  observations, patient history, and epidemiological information. If result is POSITIVE SARS-CoV-2 target nucleic acids are DETECTED. The SARS-CoV-2 RNA is generally detectable in upper and lower  respiratory specimens dur ing the acute phase of infection.  Positive  results are indicative of active infection with SARS-CoV-2.  Clinical  correlation with patient history and other diagnostic information is  necessary to determine patient infection status.  Positive results do  not rule out bacterial infection or co-infection with other viruses. If result is PRESUMPTIVE POSTIVE SARS-CoV-2 nucleic acids MAY BE PRESENT.   A presumptive positive result was obtained on the submitted specimen  and confirmed on repeat testing.  While 2019 novel coronavirus  (SARS-CoV-2) nucleic acids may be present  in the submitted sample  additional confirmatory testing may be necessary for epidemiological  and / or clinical management purposes  to differentiate between  SARS-CoV-2 and other Sarbecovirus currently known to infect humans.  If clinically indicated additional testing with an alternate test  methodology [phone]) is advised. The SARS-CoV-2 RNA is generally  detectable in upper and lower respiratory sp ecimens during the acute  phase of infection. The expected result is Negative. Fact Sheet for Patients:  StrictlyIdeas.no Fact Sheet for Healthcare Providers: BankingDealers.co.za This test is not yet approved or cleared by the [place] FDA and has been authorized for detection and/or diagnosis of SARS-CoV-2 by FDA under an Emergency Use Authorization (EUA).  This EUA will remain in effect (meaning this test can be used) for the duration of the COVID-19 declaration under Section 564(b)(1) of the Act, 21 U.S.C. section 360bbb-3(b)(1), unless the authorization is terminated or revoked sooner. Performed at [facility], [address], [place], [place] [address]   Surgical pcr screen     Status: None   Collection Time: 06/02/19 12:15 AM   Specimen: Nasal Mucosa; Nasal Swab  Result Value Ref Range Status   MRSA, PCR NEGATIVE NEGATIVE Final   Staphylococcus aureus NEGATIVE NEGATIVE Final    Comment: (NOTE) The Xpert SA Assay (FDA approved for NASAL specimens in patients 67 years of age and older), is one component of a comprehensive surveillance program. It is not intended to diagnose infection nor to guide or monitor treatment. Performed at [facility], [address] [place], [place] [address]        Today   Subjective  [name] today has no new complaints -Denies significant dizziness or significant dyspnea on exertion after transfusion -Husband at bedside, questions answered          Patient has been seen and  examined prior to discharge   Objective   Blood pressure (!) 114/48, pulse 71, temperature 97.7 F (36.5 C), temperature source Oral, resp. rate 17, height _0  (1.626 m), weight 98.7 kg, SpO2 99 %.   Intake/Output Summary (Last 24 hours) at 06/02/2019 1747 Last data filed at 06/02/2019 1500 Gross per 24 hour  Intake 1390 ml  Output 150 ml  Net 1240 ml    Exam Gen:- Awake Alert, no acute distress  HEENT:- [place].AT, No sclera icterus Neck-Supple Neck,No JVD,.  Lungs-  CTAB , good air movement bilaterally  CV- S1, S2 normal, regular, right subclavian Port-A-Cath in situ Abd-  +ve B.Sounds, Abd Soft, No tenderness,    Extremity/Skin:- No  edema,   good pulses Psych-affect is appropriate, oriented x3 Neuro-no new focal deficits, no tremors    Data Review   CBC w Diff:  Lab Results  Component Value Date   WBC 3.5 (L) 06/02/2019   HGB 8.1 (L) 06/02/2019   HGB 8.0 (L) 11/13/2018   HCT 26.9 (L) 06/02/2019   HCT 27.3 (L) 11/13/2018   PLT 48 (L) 06/02/2019   LYMPHOPCT 22 06/02/2019   BANDSPCT 0 07/05/2017   MONOPCT 11 06/02/2019   EOSPCT 2 06/02/2019   BASOPCT 1 06/02/2019    CMP:  Lab Results  Component Value Date   NA 135 06/02/2019   NA 142 10/14/2018   K 2.9 (L) 06/02/2019   CL 108 06/02/2019   CO2 21 (L) 06/02/2019   BUN 18 06/02/2019   BUN 15 10/14/2018   CREATININE 0.97 06/02/2019   CREATININE 0.69 10/22/2014   PROT 4.7 (L) 06/02/2019   ALBUMIN 2.1 (L) 06/02/2019   BILITOT 3.0 (H) 06/02/2019   ALKPHOS 88 06/02/2019   AST 36 06/02/2019   ALT 27 06/02/2019  . Total Discharge time is about 33 minutes  Roxan Hockey M.D on 06/02/2019 at 5:47 PM  Go to [email or web address] -  for contact info  [facility] - Office  [phone]

## 2019-06-02 NOTE — Care Management Obs Status (Signed)
[place] NOTIFICATION   Patient Details  Name: TELINA KLECKLEY MRN: [id number] Date of Birth: Feb 26, 1956   Medicare Observation Status Notification Given:  Yes  Copy was given to patient  Tommy Medal 06/02/2019, 3:17 PM

## 2019-06-02 NOTE — Op Note (Signed)
Patient:  Maria [name]  DOB:  1956-08-15  MRN:  [id number]   Preop Diagnosis:  Anemia   Postop Diagnosis:  Same    Procedure:  Portacath insertion   Surgeon:  Aviva Signs, MD  Anes:  MAC  Indications:  Patient is a 63yo wf who receives frequent blood transfusions and requires chronic IV access.  The risks and benefits of the procedure including bleeding, infection, and pneumothorax were fully explained to the patient, who gives informed.  Procedure note:  The patient was placed in the supine position.  The right upper chest was prepped and draped using the usual sterile technique with chloroprep.  Surgical site confirmation was performed.  1% xylocaine was used for local anesthesia.    An incision was made below the right clavicle.  A subcutaneous pocket was formed.  A needle was advanced into the subclavian vein without difficulty.  A guide wire was advanced into the subclavian vein using fluroscopy.  An introducer and peelaway sheath were placed over the guide wire.  The catheter was inserted thru the peelaway sheath and the peelaway sheath was removed.  The catheter was the attached to the port, and the port was placed into the subcutaneous pocket.  Adequate positioning was confirmed by fluroscopy.  Good back flow of venous blood was noted on aspiration of the port.  The port was flushed with hepflush.  The subcutaneous layer was reapproximated using a 3-0 vicryl suture.  The skin was closed using a 4-0 monocryl subcuticular suture.  Dermabond was applied.  All tape and needle counts were correct at the end of the procedure.  The patient was transported to the PACU in stable condition.  Complications:  none  EBL:  minimal  Specimen:  none

## 2019-06-02 NOTE — Anesthesia Postprocedure Evaluation (Signed)
Anesthesia Post Note  Patient: Maria [name]  Procedure(s) Performed: INSERTION PORT-A-CATH (Right Chest)  Patient location during evaluation: PACU Anesthesia Type: General Level of consciousness: awake and alert and oriented Pain management: pain level controlled Vital Signs Assessment: post-procedure vital signs reviewed and stable Respiratory status: spontaneous breathing Cardiovascular status: stable and blood pressure returned to baseline Postop Assessment: no apparent nausea or vomiting Anesthetic complications: no     Last Vitals:  Vitals:   06/02/19 1200 06/02/19 1354  BP: (!) 127/47 (!) 114/48  Pulse: 74 71  Resp: 20 17  Temp:  36.5 C  SpO2: 97% 99%    Last Pain:  Vitals:   06/02/19 1354  TempSrc: Oral  PainSc:                  Tressie Stalker

## 2019-06-02 NOTE — Progress Notes (Signed)
Patient ordered to receive one more unit of PRBC. Patients hemoglobin 8.4 after receiving third unit. Notified by lab due to National blood shortage that we are unable to give another unit of PRBC's at this time. Dr. Maudie Mercury notified. Will continue to monitor.

## 2019-06-02 NOTE — Anesthesia Preprocedure Evaluation (Addendum)
Anesthesia Evaluation  Patient identified by MRN, date of birth, ID band Patient awake    Reviewed: Allergy & Precautions, NPO status , Patient's Chart, lab work & pertinent test results  History of Anesthesia Complications (+) PONV and history of anesthetic complications  Airway Mallampati: II  TM Distance: >3 FB Neck ROM: Full    Dental  (+) Implants   Pulmonary    Pulmonary exam normal breath sounds clear to auscultation       Cardiovascular hypertension, + CAD and +CHF  Normal cardiovascular exam Rhythm:Regular Rate:Normal     Neuro/Psych PSYCHIATRIC DISORDERS Anxiety Depression  Neuromuscular disease    GI/Hepatic GERD  Medicated and Controlled,(+) Cirrhosis   Esophageal Varices    ,   Endo/Other  diabetes, Type 2, Insulin DependentHypothyroidism   Renal/GU      Musculoskeletal  (+) Arthritis , Fibromyalgia -  Abdominal   Peds  Hematology  (+) anemia ,   Anesthesia Other Findings ROS comment: Assessment: - Nocturnal hypoxia based on PSG from 2018, mild (O2 nadir 88%) without evidence of sleep apnea. Could be related to obesity or fluid overload - Elevated A-a gradient, possibly due to pulmonary edema. No evidence of shunt on echo - Prior abnormal chest CT scan showing air trapping, possibly related to obesity. PFTs and history are not consistent with asthma or COPD - Obesity Cardiovascular  (+) hypertension well controlled, angina, DOE, murmur,   Rhythm: regular Rate: normal ROS comment: Right heart cath:  Findings: 1. RA mean 4 mm Hg, RV 36/3, PA 43/4 mean 21, PCWP mean 7 2. PA sat 69 3. Fick CO/CI 8.0/4.2 4. There is mild elevation of PVR with normal filling pressures  11/2017 dobutamine stress test Negative dobutamine stress echocardiogram - there is no electrocardiographic or echocardiographic evidence of inducible myocardial ischemia.  11/3017 ECHO  Normal left ventricular systolic function,  ejection fraction > 18%  Diastolic dysfunction - grade I (normal filling pressures)  Dilated left atrium - mild  Elevated pulmonary artery systolic pressure - mild to moderate  Dilated right ventricle - mild  Normal right ventricular systolic function  No evidence of an interatrial communication or intrapulmonary shunt  Neuro/Psych - negative ROS GI/Hepatic/Renal  (+) GERD, liver disease,  Endo/Other  (+) diabetes mellitus type 2 poorly controlled using insulin, hypothyroidism, blood dyscrasia (Anemia), ,   Reproductive/Obstetrics                            Anesthesia Physical Anesthesia Plan  ASA: IV  Anesthesia Plan: MAC   Post-op Pain Management:    Induction: Intravenous  PONV Risk Score and Plan:   Airway Management Planned: Natural Airway, Nasal Cannula and Simple Face Mask  Additional Equipment:   Intra-op Plan:   Post-operative Plan:   Informed Consent: I have reviewed the patients History and Physical, chart, labs and discussed the procedure including the risks, benefits and alternatives for the proposed anesthesia with the patient or authorized representative who has indicated his/her understanding and acceptance.     Dental advisory given  Plan Discussed with: CRNA  Anesthesia Plan Comments: (Possible GA with ETT was explained)       Anesthesia Quick Evaluation

## 2019-06-02 NOTE — Progress Notes (Signed)
//  Discharge instructions given to patient and family at bedside. Paperwork provided . All questions answered and patient denied any needs or further questions.  Elodia Florence RN

## 2019-06-02 NOTE — Progress Notes (Signed)
Patient's blood transfusion complete. MD notified. Order to give Lasix 20 mg IV after completion of this unit of blood. Patient has been given three units of PRBC's this admission. Patient ordered to receive one more unit at this time. Lab notified to check H&H at appropriate draw time. Will continue to monitor.

## 2019-06-03 ENCOUNTER — Encounter (HOSPITAL_COMMUNITY): Payer: Self-pay | Admitting: General Surgery

## 2019-06-03 ENCOUNTER — Encounter (HOSPITAL_COMMUNITY): Payer: PPO

## 2019-06-03 ENCOUNTER — Encounter (INDEPENDENT_AMBULATORY_CARE_PROVIDER_SITE_OTHER): Payer: PPO | Admitting: Ophthalmology

## 2019-06-03 DIAGNOSIS — E039 Hypothyroidism, unspecified: Secondary | ICD-10-CM

## 2019-06-03 DIAGNOSIS — I10 Essential (primary) hypertension: Secondary | ICD-10-CM

## 2019-06-03 DIAGNOSIS — Z9049 Acquired absence of other specified parts of digestive tract: Secondary | ICD-10-CM

## 2019-06-03 DIAGNOSIS — K31811 Angiodysplasia of stomach and duodenum with bleeding: Secondary | ICD-10-CM

## 2019-06-03 DIAGNOSIS — E440 Moderate protein-calorie malnutrition: Secondary | ICD-10-CM

## 2019-06-03 DIAGNOSIS — D500 Iron deficiency anemia secondary to blood loss (chronic): Secondary | ICD-10-CM

## 2019-06-03 DIAGNOSIS — K7460 Unspecified cirrhosis of liver: Secondary | ICD-10-CM

## 2019-06-03 DIAGNOSIS — K31819 Angiodysplasia of stomach and duodenum without bleeding: Secondary | ICD-10-CM

## 2019-06-03 DIAGNOSIS — M810 Age-related osteoporosis without current pathological fracture: Secondary | ICD-10-CM

## 2019-06-03 DIAGNOSIS — M797 Fibromyalgia: Secondary | ICD-10-CM

## 2019-06-03 DIAGNOSIS — F329 Major depressive disorder, single episode, unspecified: Secondary | ICD-10-CM

## 2019-06-03 DIAGNOSIS — Z794 Long term (current) use of insulin: Secondary | ICD-10-CM

## 2019-06-03 DIAGNOSIS — E7800 Pure hypercholesterolemia, unspecified: Secondary | ICD-10-CM

## 2019-06-03 DIAGNOSIS — K760 Fatty (change of) liver, not elsewhere classified: Secondary | ICD-10-CM

## 2019-06-03 DIAGNOSIS — K449 Diaphragmatic hernia without obstruction or gangrene: Secondary | ICD-10-CM

## 2019-06-03 DIAGNOSIS — I272 Pulmonary hypertension, unspecified: Secondary | ICD-10-CM

## 2019-06-03 DIAGNOSIS — E8809 Other disorders of plasma-protein metabolism, not elsewhere classified: Secondary | ICD-10-CM

## 2019-06-03 DIAGNOSIS — I491 Atrial premature depolarization: Secondary | ICD-10-CM

## 2019-06-03 DIAGNOSIS — Z6837 Body mass index (BMI) 37.0-37.9, adult: Secondary | ICD-10-CM

## 2019-06-03 DIAGNOSIS — E119 Type 2 diabetes mellitus without complications: Secondary | ICD-10-CM

## 2019-06-03 NOTE — Progress Notes (Signed)
  Phone call from pt and husband  --Pt and husband states she does not want to take Propranolol or  Atenolol for portal HTN.. she will hold rx for Atenolol until she sees Dr. Laural Golden as outpatient and dw him Roxan Hockey, MD

## 2019-06-04 ENCOUNTER — Encounter (HOSPITAL_COMMUNITY): Admission: RE | Payer: Self-pay | Source: Home / Self Care

## 2019-06-04 ENCOUNTER — Encounter (HOSPITAL_COMMUNITY): Payer: PPO

## 2019-06-04 ENCOUNTER — Ambulatory Visit (HOSPITAL_COMMUNITY): Admission: RE | Admit: 2019-06-04 | Payer: PPO | Source: Home / Self Care | Admitting: General Surgery

## 2019-06-04 LAB — TYPE AND SCREEN
ABO/RH(D): O NEG
Antibody Screen: NEGATIVE
Unit division: 0
Unit division: 0
Unit division: 0
Unit division: 0
Unit division: 0

## 2019-06-04 LAB — BPAM RBC
Blood Product Expiration Date: 202010032359
Blood Product Expiration Date: 202010102359
Blood Product Expiration Date: 202010182359
Blood Product Expiration Date: 202010192359
Blood Product Expiration Date: 202010202359
ISSUE DATE / TIME: 202009122348
ISSUE DATE / TIME: 202009130412
ISSUE DATE / TIME: 202009131117
ISSUE DATE / TIME: 202009132132
Unit Type and Rh: 9500
Unit Type and Rh: 9500
Unit Type and Rh: 9500
Unit Type and Rh: 9500
Unit Type and Rh: 9500

## 2019-06-04 SURGERY — INSERTION, TUNNELED CENTRAL VENOUS DEVICE, WITH PORT
Anesthesia: Monitor Anesthesia Care

## 2019-06-05 ENCOUNTER — Other Ambulatory Visit (INDEPENDENT_AMBULATORY_CARE_PROVIDER_SITE_OTHER): Payer: Self-pay | Admitting: *Deleted

## 2019-06-05 ENCOUNTER — Other Ambulatory Visit (HOSPITAL_COMMUNITY): Payer: Self-pay | Admitting: Cardiology

## 2019-06-05 ENCOUNTER — Other Ambulatory Visit (INDEPENDENT_AMBULATORY_CARE_PROVIDER_SITE_OTHER): Payer: Self-pay | Admitting: Internal Medicine

## 2019-06-05 ENCOUNTER — Encounter (INDEPENDENT_AMBULATORY_CARE_PROVIDER_SITE_OTHER): Payer: Self-pay

## 2019-06-05 DIAGNOSIS — K7290 Hepatic failure, unspecified without coma: Secondary | ICD-10-CM

## 2019-06-05 DIAGNOSIS — R600 Localized edema: Secondary | ICD-10-CM

## 2019-06-05 DIAGNOSIS — D500 Iron deficiency anemia secondary to blood loss (chronic): Secondary | ICD-10-CM

## 2019-06-05 DIAGNOSIS — K7460 Unspecified cirrhosis of liver: Secondary | ICD-10-CM

## 2019-06-05 DIAGNOSIS — K31819 Angiodysplasia of stomach and duodenum without bleeding: Secondary | ICD-10-CM

## 2019-06-05 DIAGNOSIS — K7682 Hepatic encephalopathy: Secondary | ICD-10-CM

## 2019-06-05 DIAGNOSIS — E876 Hypokalemia: Secondary | ICD-10-CM

## 2019-06-09 ENCOUNTER — Other Ambulatory Visit: Payer: Self-pay

## 2019-06-09 ENCOUNTER — Other Ambulatory Visit (HOSPITAL_COMMUNITY)
Admission: RE | Admit: 2019-06-09 | Discharge: 2019-06-09 | Disposition: A | Discharge status: Home / Self Care | Payer: PPO | Source: Ambulatory Visit | Attending: Internal Medicine | Admitting: Internal Medicine

## 2019-06-09 ENCOUNTER — Encounter (HOSPITAL_COMMUNITY)
Admission: RE | Admit: 2019-06-09 | Discharge: 2019-06-09 | Disposition: A | Discharge status: Home / Self Care | Payer: PPO | Source: Ambulatory Visit | Attending: Internal Medicine | Admitting: Internal Medicine

## 2019-06-09 DIAGNOSIS — D649 Anemia, unspecified: Secondary | ICD-10-CM | POA: Diagnosis not present

## 2019-06-09 DIAGNOSIS — Z6835 Body mass index (BMI) 35.0-35.9, adult: Secondary | ICD-10-CM | POA: Diagnosis not present

## 2019-06-09 DIAGNOSIS — Z299 Encounter for prophylactic measures, unspecified: Secondary | ICD-10-CM | POA: Diagnosis not present

## 2019-06-09 DIAGNOSIS — K7460 Unspecified cirrhosis of liver: Secondary | ICD-10-CM | POA: Diagnosis not present

## 2019-06-09 DIAGNOSIS — I10 Essential (primary) hypertension: Secondary | ICD-10-CM | POA: Diagnosis not present

## 2019-06-09 DIAGNOSIS — K922 Gastrointestinal hemorrhage, unspecified: Secondary | ICD-10-CM | POA: Diagnosis not present

## 2019-06-09 DIAGNOSIS — K7581 Nonalcoholic steatohepatitis (NASH): Secondary | ICD-10-CM | POA: Insufficient documentation

## 2019-06-09 DIAGNOSIS — E8770 Fluid overload, unspecified: Secondary | ICD-10-CM | POA: Diagnosis not present

## 2019-06-09 DIAGNOSIS — E1165 Type 2 diabetes mellitus with hyperglycemia: Secondary | ICD-10-CM | POA: Diagnosis not present

## 2019-06-09 LAB — COMPREHENSIVE METABOLIC PANEL
ALT: 25 U/L (ref 0–44)
AST: 35 U/L (ref 15–41)
Albumin: 2.1 g/dL — ABNORMAL LOW (ref 3.5–5.0)
Alkaline Phosphatase: 74 U/L (ref 38–126)
Anion gap: 6 (ref 5–15)
BUN: 23 mg/dL (ref 8–23)
CO2: 23 mmol/L (ref 22–32)
Calcium: 8.1 mg/dL — ABNORMAL LOW (ref 8.9–10.3)
Chloride: 110 mmol/L (ref 98–111)
Creatinine, Ser: 0.88 mg/dL (ref 0.44–1.00)
GFR calc Af Amer: >60 mL/min (ref >60)
GFR calc non Af Amer: >60 mL/min (ref >60)
Glucose, Bld: 142 mg/dL — ABNORMAL HIGH (ref 70–99)
Potassium: 4.1 mmol/L (ref 3.5–5.1)
Sodium: 139 mmol/L (ref 135–145)
Total Bilirubin: 2.6 mg/dL — ABNORMAL HIGH (ref 0.3–1.2)
Total Protein: 4.5 g/dL — ABNORMAL LOW (ref 6.5–8.1)

## 2019-06-09 LAB — SAMPLE TO BLOOD BANK

## 2019-06-09 LAB — MAGNESIUM: Magnesium: 1.9 mg/dL (ref 1.7–2.4)

## 2019-06-09 LAB — HEMOGLOBIN AND HEMATOCRIT, BLOOD
HCT: 23.4 % — ABNORMAL LOW (ref 36.0–46.0)
Hemoglobin: 6.7 g/dL — CL (ref 12.0–15.0)

## 2019-06-09 LAB — PREPARE RBC (CROSSMATCH)

## 2019-06-09 MED ORDER — HEPARIN SOD (PORK) LOCK FLUSH 100 UNIT/ML IV SOLN
500.0000 [IU] | Freq: Once | INTRAVENOUS | Status: AC
  Administered 2019-06-09: 500 [IU] via INTRAVENOUS

## 2019-06-10 ENCOUNTER — Encounter (HOSPITAL_COMMUNITY): Payer: Self-pay

## 2019-06-10 ENCOUNTER — Ambulatory Visit (HOSPITAL_COMMUNITY)
Admission: RE | Admit: 2019-06-10 | Discharge: 2019-06-10 | Disposition: A | Discharge status: Home / Self Care | Payer: PPO | Source: Ambulatory Visit | Attending: Internal Medicine | Admitting: Internal Medicine

## 2019-06-10 DIAGNOSIS — D509 Iron deficiency anemia, unspecified: Secondary | ICD-10-CM | POA: Insufficient documentation

## 2019-06-10 DIAGNOSIS — K7460 Unspecified cirrhosis of liver: Secondary | ICD-10-CM | POA: Diagnosis not present

## 2019-06-10 DIAGNOSIS — K922 Gastrointestinal hemorrhage, unspecified: Secondary | ICD-10-CM | POA: Diagnosis not present

## 2019-06-10 MED ORDER — ACETAMINOPHEN 325 MG PO TABS
650.0000 mg | ORAL_TABLET | Freq: Once | ORAL | Status: AC
  Administered 2019-06-10: 650 mg via ORAL

## 2019-06-10 MED ORDER — DIPHENHYDRAMINE HCL 25 MG PO CAPS
25.0000 mg | ORAL_CAPSULE | Freq: Once | ORAL | Status: AC
  Administered 2019-06-10: 25 mg via ORAL

## 2019-06-10 MED ORDER — HEPARIN SOD (PORK) LOCK FLUSH 100 UNIT/ML IV SOLN: 500.0000 [IU] | Freq: Once | INTRAVENOUS | Status: DC

## 2019-06-10 MED ORDER — SODIUM CHLORIDE 0.9% IV SOLUTION
Freq: Once | INTRAVENOUS | Status: AC
  Administered 2019-06-10: 09:00:00 via INTRAVENOUS

## 2019-06-10 NOTE — Unmapped (Signed)
Received on call page from patient's daughter French Ana who had overall concerns about mother's ongoing black stools and low hemoglobin.  Per daughter patient is seen weekly @ local [facility] and receives 1-2 units of blood.  She notes hemoglobin yesterday was 6.4 and thus she was transfused with 1 unit of blood today.  Per daughter hospital told her they were out of blood and she should return in 1 weeks for repeat labs and possible transfusion.    Daughter paged concerned for overall plan and goals given ongoing black stools have been ongoing since recent endoscopic procedure and blood transfusions.  While she admits there has been no acute change today she is concerned that bleeding is not improving and has not for weeks.    Advised given her inquiry seems more based on understanding expectations of goals versus acute instability (given description of these issues being ongoing for weeks.month) will forward message to primary hepatologist and coordinator for follow-up on 9/23.    Advised daughter if patient displays any acutely concerning symptoms (low bp, increased bleeding in stools etc) she should seek immediate medical attention.

## 2019-06-11 LAB — TYPE AND SCREEN
ABO/RH(D): O NEG
Antibody Screen: NEGATIVE
Unit division: 0

## 2019-06-11 LAB — BPAM RBC
Blood Product Expiration Date: 202010212359
ISSUE DATE / TIME: 202009220940
Unit Type and Rh: 9500

## 2019-06-11 NOTE — Unmapped (Signed)
Call placed and spoke with both [name] re: need to move EGD appt. Made them aware that EGD now scheduled to take place this Friday @ [facility] ( arrival time [phone]). Covid test scheduled for collection tomorrow @ 0830 in [place]

## 2019-06-12 ENCOUNTER — Encounter: Admit: 2019-06-12 | Discharge: 2019-06-12 | Payer: PRIVATE HEALTH INSURANCE

## 2019-06-12 ENCOUNTER — Ambulatory Visit: Admit: 2019-06-12 | Discharge: 2019-06-13 | Payer: PRIVATE HEALTH INSURANCE

## 2019-06-12 DIAGNOSIS — Z01818 Encounter for other preprocedural examination: Secondary | ICD-10-CM

## 2019-06-12 DIAGNOSIS — R5381 Other malaise: Secondary | ICD-10-CM

## 2019-06-12 DIAGNOSIS — K7469 Other cirrhosis of liver: Secondary | ICD-10-CM

## 2019-06-12 DIAGNOSIS — Z20828 Contact with and (suspected) exposure to other viral communicable diseases: Secondary | ICD-10-CM

## 2019-06-12 DIAGNOSIS — Z01812 Encounter for preprocedural laboratory examination: Secondary | ICD-10-CM

## 2019-06-12 NOTE — Unmapped (Signed)
Name:  Marilyn French  DOB: January 15, 1956  Today's Date: 06/12/2019  Age:  63 y.o.    Today's Visit Included:   Indication for Testing: Patient is scheduled for an upcoming procedure, surgery or treatment requiring pre-testing for COVID-19.    Informed the patient that their surgeon will be notified of results in 24-48 hours.     Advised patient to self-isolate and remain in his/her home until the surgery to minimize risk of COVID-19 infection prior to surgery.    Also advised patient to notify his/her surgeon/specialist immediately if the patient develops any new symptoms such as:  []  Subjective fever  []  Chills  []  Muscle aches  []  Runny nose  []  Sore throat  []  Loss of taste or smell   []  Cough (new or worsening of chronic cough)  []  Shortness of breath  []  Diarrhea (3 or more loose stools in the last 24 hours)  []  Fatigue (new or worsening)  []  Abdominal pain  []  Nausea or vomiting  []  Headache     Advised all household members and close contacts to remain in the house as much as possible as well.     COVID-19 testing performed.    Peggyann Shoals, CMA

## 2019-06-12 NOTE — Unmapped (Signed)
Addended by: Sinclair Ship on: 06/12/2019 09:09 AM     Modules accepted: Orders

## 2019-06-13 ENCOUNTER — Ambulatory Visit
Admit: 2019-06-13 | Discharge: 2019-06-27 | Disposition: A | Discharge status: Home Health | Payer: PRIVATE HEALTH INSURANCE

## 2019-06-13 ENCOUNTER — Encounter
Admit: 2019-06-13 | Discharge: 2019-06-27 | Disposition: A | Discharge status: Home Health | Payer: PRIVATE HEALTH INSURANCE

## 2019-06-13 DIAGNOSIS — E440 Moderate protein-calorie malnutrition: Secondary | ICD-10-CM

## 2019-06-13 DIAGNOSIS — D500 Iron deficiency anemia secondary to blood loss (chronic): Secondary | ICD-10-CM

## 2019-06-13 DIAGNOSIS — K7460 Unspecified cirrhosis of liver: Secondary | ICD-10-CM

## 2019-06-13 DIAGNOSIS — K31819 Angiodysplasia of stomach and duodenum without bleeding: Secondary | ICD-10-CM

## 2019-06-13 DIAGNOSIS — E119 Type 2 diabetes mellitus without complications: Secondary | ICD-10-CM

## 2019-06-13 DIAGNOSIS — E039 Hypothyroidism, unspecified: Secondary | ICD-10-CM

## 2019-06-13 DIAGNOSIS — K449 Diaphragmatic hernia without obstruction or gangrene: Secondary | ICD-10-CM

## 2019-06-13 DIAGNOSIS — E7800 Pure hypercholesterolemia, unspecified: Secondary | ICD-10-CM

## 2019-06-13 DIAGNOSIS — I10 Essential (primary) hypertension: Secondary | ICD-10-CM

## 2019-06-13 DIAGNOSIS — K31811 Angiodysplasia of stomach and duodenum with bleeding: Secondary | ICD-10-CM

## 2019-06-13 DIAGNOSIS — Z794 Long term (current) use of insulin: Secondary | ICD-10-CM

## 2019-06-13 DIAGNOSIS — E8809 Other disorders of plasma-protein metabolism, not elsewhere classified: Secondary | ICD-10-CM

## 2019-06-13 DIAGNOSIS — Z6837 Body mass index (BMI) 37.0-37.9, adult: Secondary | ICD-10-CM

## 2019-06-13 DIAGNOSIS — K760 Fatty (change of) liver, not elsewhere classified: Secondary | ICD-10-CM

## 2019-06-13 DIAGNOSIS — I272 Pulmonary hypertension, unspecified: Secondary | ICD-10-CM

## 2019-06-13 DIAGNOSIS — D649 Anemia, unspecified: Secondary | ICD-10-CM

## 2019-06-13 DIAGNOSIS — F329 Major depressive disorder, single episode, unspecified: Secondary | ICD-10-CM

## 2019-06-13 DIAGNOSIS — I491 Atrial premature depolarization: Secondary | ICD-10-CM

## 2019-06-13 DIAGNOSIS — M810 Age-related osteoporosis without current pathological fracture: Secondary | ICD-10-CM

## 2019-06-13 DIAGNOSIS — Z9049 Acquired absence of other specified parts of digestive tract: Secondary | ICD-10-CM

## 2019-06-13 DIAGNOSIS — M797 Fibromyalgia: Secondary | ICD-10-CM

## 2019-06-13 DIAGNOSIS — K3189 Other diseases of stomach and duodenum: Secondary | ICD-10-CM | POA: Diagnosis not present

## 2019-06-13 DIAGNOSIS — Z95828 Presence of other vascular implants and grafts: Secondary | ICD-10-CM | POA: Diagnosis not present

## 2019-06-13 DIAGNOSIS — K921 Melena: Secondary | ICD-10-CM | POA: Diagnosis not present

## 2019-06-13 DIAGNOSIS — E8770 Fluid overload, unspecified: Secondary | ICD-10-CM | POA: Diagnosis not present

## 2019-06-13 DIAGNOSIS — R188 Other ascites: Secondary | ICD-10-CM | POA: Diagnosis not present

## 2019-06-13 DIAGNOSIS — K7581 Nonalcoholic steatohepatitis (NASH): Secondary | ICD-10-CM | POA: Diagnosis not present

## 2019-06-13 DIAGNOSIS — K7469 Other cirrhosis of liver: Secondary | ICD-10-CM | POA: Diagnosis not present

## 2019-06-13 DIAGNOSIS — D62 Acute posthemorrhagic anemia: Secondary | ICD-10-CM | POA: Diagnosis not present

## 2019-06-13 DIAGNOSIS — K5520 Angiodysplasia of colon without hemorrhage: Secondary | ICD-10-CM | POA: Diagnosis not present

## 2019-06-13 DIAGNOSIS — R601 Generalized edema: Secondary | ICD-10-CM | POA: Diagnosis not present

## 2019-06-13 DIAGNOSIS — R161 Splenomegaly, not elsewhere classified: Secondary | ICD-10-CM | POA: Diagnosis not present

## 2019-06-13 DIAGNOSIS — K922 Gastrointestinal hemorrhage, unspecified: Secondary | ICD-10-CM | POA: Diagnosis not present

## 2019-06-13 DIAGNOSIS — K766 Portal hypertension: Secondary | ICD-10-CM | POA: Diagnosis not present

## 2019-06-13 DIAGNOSIS — R112 Nausea with vomiting, unspecified: Secondary | ICD-10-CM | POA: Diagnosis not present

## 2019-06-13 DIAGNOSIS — I871 Compression of vein: Secondary | ICD-10-CM | POA: Diagnosis not present

## 2019-06-13 DIAGNOSIS — R110 Nausea: Secondary | ICD-10-CM | POA: Diagnosis not present

## 2019-06-13 LAB — CBC
HEMOGLOBIN: 6.9 g/dL — ABNORMAL LOW (ref 12.0–16.0)
MEAN CORPUSCULAR HEMOGLOBIN CONC: 31.1 g/dL (ref 31.0–37.0)
MEAN CORPUSCULAR VOLUME: 89.7 fL (ref 80.0–100.0)
MEAN PLATELET VOLUME: 12.9 fL — ABNORMAL HIGH (ref 7.0–10.0)
PLATELET COUNT: 50 10*9/L — ABNORMAL LOW (ref 150–440)
RED BLOOD CELL COUNT: 2.46 10*12/L — ABNORMAL LOW (ref 4.00–5.20)
RED CELL DISTRIBUTION WIDTH: 20.2 % — ABNORMAL HIGH (ref 12.0–15.0)
WBC ADJUSTED: 2.7 10*9/L — ABNORMAL LOW (ref 4.5–11.0)

## 2019-06-13 LAB — WBC ADJUSTED: Leukocytes:NCnc:Pt:Bld:Qn:: 2.7 — ABNORMAL LOW

## 2019-06-13 NOTE — Unmapped (Addendum)
General Medicine History and Physical    Assessment/Plan:    Principal Problem:    Upper GI bleeding  Active Problems:    NAFLD (nonalcoholic fatty liver disease)    Cirrhosis (CMS-HCC)    GAVE (gastric antral vascular ectasia)    Chronic blood loss anemia      Marilyn French is a 63 y.o. female with PMHx as noted below that presents to [facility] with Upper GI bleeding.    Upper GI bleeding -  this is likely secondary to gave as well as gastric angiectasias.  Appreciate GI assistance.  She had APC performed today again to gave lesions as well as some angiectasia is in her stomach banded.  However she has had ongoing transfusion dependent anemia despite multiple sessions of APC.  We will plan for capsule endoscopy tomorrow in case we are missing some other source.   - clear liquid diet   - 1/2 go-lytely prep tonight   - plan for capsule endoscopy tomorrow (ordered by GI already)   - continue protonix orally 40 BID   - continue carafate QID    NAFLD Cirrhosis -she appears volume overloaded today on exam and also by her reported weight increase.  She has been taking Demadex and Aldactone at home, and also several doses of Zaroxolyn in the last couple of weeks per her report.   It is already late in the afternoon, and the patient is still waking up from anesthesia.  Therefore I think it is reasonable to hold off on IV diuresis until tomorrow a.m.   -Continue Rifamixin   -Continue lactulose   -Continue Aldactone    - Change 20 demadex to 40 Lasix IV, BID starting in AM   -strict I/O   - Daily weights   - BMP now and in AM to watch K and electrolytes while diuresing.     Chronic Blood loss anemia -hemoglobin was 6.9 today and she was written to receive 1 unit of packed red blood cells by the gastroenterologist.   - CBC q8 hours   -Transfuse as needed    Code Status:  Full Code  ___________________________________________________________________    Chief Complaint  No chief complaint on file.      HPI:  Marilyn French is a 63 y.o. woman with a history of NAFLD cirrhosis.  She has a history of GAVE and has been treated with APC multiple times.  She has developed a transfusion dependent anemia with chronic blood loss.  She presented again today for upper endoscopy.  I discussed the case with Dr. Waynetta Sandy, the gastroenterologist.  He stated that again performed APC to multiple gave lesions as well as banding of some angiectasia is in the stomach today.  Her hemoglobin was again low so they transfuse her 1 unit of packed red blood cells.  They are concerned that there may be another source of bleeding that they have not yet discovered, and so are planning on a capsule endoscopy tomorrow.  We will watch the patient's hemoglobin overnight and prepare her for this procedure.      The patient states that she has been noticed an increased swelling of her lower extremities bilaterally over the last several weeks.  Her baseline weight is about 187 pounds and she says lately she has been over 200 pounds.  She has been through several 3-day treatments of extra Zaroxolyn that was prescribed by another doctor in the last 2 weeks, however she has not felt like she has been improving at  all.  She denies any new shortness of breath at rest.  She does have some dyspnea with exertion however.  After the procedure today, the patient is exhausted and feeling nauseous.  I wrote her to get a dose of Zofran while in the GI post procedure area.    Allergies:  Ferumoxytol and Tramadol hcl    Medications:   Prior to Admission medications    Medication Dose, Route, Frequency   cholecalciferol, vitamin D3, (CHOLECALCIFEROL) 1,000 unit tablet 1,000 Units, Oral, Every Monday, Wednesday, Friday   insulin ASPART (NOVOLOG FLEXPEN U-100 INSULIN) 100 unit/mL injection pen Use with given sliding scale    CBG 70-120: 0 units  CBG 121-150: 1 unit  CBG 151-200: 2 units  CBG 201-250: 3 units  CBG 251-300: 5 units  CBG 301-350: 7 units  CBG greater than 351: 9 units   lactulose (CHRONULAC) 10 gram/15 mL solution 20 g, Oral, 4 times a day   levothyroxine (SYNTHROID, LEVOTHROID) 25 MCG tablet 25 mcg, Oral, Daily (standard)   magnesium oxide (MAG-OX) 400 mg (241.3 mg magnesium) tablet 800 mg, Oral, Daily (standard), Take for hand and/or leg cramping.   OXYGEN-AIR DELIVERY SYSTEMS MISC 2 L, Inhalation, Nightly   pantoprazole (PROTONIX) 40 MG tablet 40 mg, Oral, 2 times a day (standard)   pediatric multivit-iron-min (FLINTSTONES COMPLETE) tablet 2 tablets, Oral, Daily (standard)   pen needle, diabetic 31 gauge x 1/4 Ndle No dose, route, or frequency recorded.   potassium chloride SA (K-DUR,KLOR-CON) 20 MEQ tablet 20 mEq, Oral, Daily (standard)   rifAXIMin (XIFAXAN) 550 mg Tab 550 mg, Oral, 2 times a day (standard)   rosuvastatin (CRESTOR) 5 MG tablet 5 mg, Oral, Every other day   sertraline (ZOLOFT) 50 MG tablet 50 mg, Oral, Daily (standard), Please take 1/2 tablet (25mg ) daily by mouth for 4 days, then increase to 1 tablet (50mg ) by mouth daily.   spironolactone (ALDACTONE) 50 MG tablet 50 mg, Oral, Daily (standard)   sucralfate (CARAFATE) 100 mg/mL suspension 1 g, Oral, 4 times a day   torsemide (DEMADEX) 20 MG tablet 20 mg, Oral, Daily (standard)   insulin glargine (LANTUS SOLOSTAR U-100 INSULIN) 100 unit/mL (3 mL) injection pen 45 Units, Subcutaneous, Daily (standard)       Medical History:  Past Medical History:   Diagnosis Date   ??? Cirrhosis (CMS-HCC)    ??? Depression    ??? Diabetes mellitus (CMS-HCC)    ??? Fibromyalgia    ??? GAVE (gastric antral vascular ectasia)    ??? History of transfusion 04/2019    reports frequent blood tranfusions   ??? HTN (hypertension)    ??? Hypercholesterolemia    ??? Hypothyroid    ??? NAFLD (nonalcoholic fatty liver disease)    ??? Osteoporosis        Surgical History:  Past Surgical History:   Procedure Laterality Date   ??? APPENDECTOMY     ??? CHOLECYSTECTOMY     ??? IR TIPS  07/08/2018    IR TIPS 07/08/2018 Soledad Gerlach, MD IMG VIR [facility]V [facility]   ??? PR RIGHT HEART CATH O2 SATURATION & CARDIAC OUTPUT Right 01/18/2018    Procedure: Right Heart Catheterization;  Surgeon: Marlaine Hind, MD;  Location: [facility] CATH;  Service: Cardiology   ??? PR UPPER GI ENDOSCOPY,CTRL BLEED N/A 03/05/2019    Procedure: UGI ENDOSCOPY; WITH CONTROL OF BLEEDING, ANY METHOD;  Surgeon: Andrey Farmer, MD;  Location: [facility];  Service: Gastroenterology   ??? PR UPPER GI  ENDOSCOPY,CTRL BLEED  04/04/2019    Procedure: UGI ENDOSCOPY; WITH CONTROL OF BLEEDING, ANY METHOD;  Surgeon: Janyth Pupa, MD;  Location: [facility];  Service: Gastroenterology   ??? PR UPPER GI ENDOSCOPY,CTRL BLEED N/A 05/23/2019    Procedure: UGI ENDOSCOPY; WITH CONTROL OF BLEEDING, ANY METHOD;  Surgeon: Janyth Pupa, MD;  Location: [facility];  Service: Gastroenterology   ??? PR UPPER GI ENDOSCOPY,DIAGNOSIS N/A 03/03/2019    Procedure: UGI ENDO, INCLUDE ESOPHAGUS, STOMACH, & DUODENUM &/OR JEJUNUM; DX W/WO COLLECTION SPECIMN, BY BRUSH OR WASH;  Surgeon: Luanne Bras, MD;  Location: GI [facility];  Service: Gastroenterology   ??? SALPINGOOPHORECTOMY         Social History:  Tobacco use:   reports that she has never smoked. She has never used smokeless tobacco.  Alcohol use:   reports no history of alcohol use.  Drug use:  reports no history of drug use.  Living situation: the patient lives with their spouse.  They live in [place], [place].  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    Review of Systems:  10 systems reviewed and are negative unless otherwise mentioned in HPI    Family History: Patient's mother had lung cancer.  Patient's father died with COPD bladder cancer.  Brother died of colorectal cancer.  Multiple sisters have diabetes.    Physical Exam:  Temp:  [36.3 ??C (97.3 ??F)-36.8 ??C (98.2 ??F)] 36.8 ??C (98.2 ??F)  Heart Rate:  [80-96] 96  Resp:  [12-19] 12  BP: (122-125)/(44-46) 122/44 SpO2:  [99 %-100 %] 99 %  Body mass index is 37.93 kg/m??.    GENERAL: Obese middle-aged woman lying on the stretcher in the post procedure GI area in no respiratory distress.  She appears uncomfortable though and nauseated.  Alert and oriented to name, place, date.   HEENT:  Extraoccular muscles intact, pupils equally round and reactive to light.  Mucous membranes moist, oropharynx clear  NECK:  Supple, no cervical or clavicular lymphadenopathy  CHEST:  Clear to auscultation.  Good air movement, no wheezes rales or rhonchi  HEART:  Regular rate and rhythm.  Normal S1 and S2.  No murmurs.  ABDOMEN: Obese, soft, non-tender, non-distended.  Normal active bowel sounds.  No rebound or guarding.  No masses or organomegaly detected.  EXTREMITIES: 2+ pitting edema to the knees bilaterally.  No foot ulcers or sores.  SKIN:  No rashes or lesions  MUSCULOSKELETAL:  No joint effusions or tenderness.  VASCULAR:  2+ Dorsalis pedis and radial pulses bilaterally    Test Results:  Data Review:  I have reviewed the labs and studies from the last 24 hours.    Imaging: None

## 2019-06-13 NOTE — Unmapped (Signed)
HEPATOLOGY INPATIENT CONSULTATION NOTE     Requesting Attending Physician:  Arelia Sneddon*    Reason for Consult:      [phone] w/hx of NAFLD cirrhosis (d/b ascites, HE, EV bleeding), s/p TIPS 06/2018 and embolization of mesocaval varices, w/subsequent worsening HE, also c/b GAVE requiring multiple episodes APC, who presents as direct admission from endoscopy for planned AM capsule endoscopy in s/o chronic GIB. (MELD-Na 15).     Patient is seen in consultation at the request of Dr. Nelta Numbers [facility]* for evaluation of chronic GIB    ASSESSMENT: Patient currently presenting with chronic GI bleeding, requiring transfusion today and endoscopy.  The angiectasia's that were seen in her stomach may not explain her recent two-point hemoglobin drop, and there could be another source of her melena and bleeding, and so a capsule endoscopy may be able to further help [place] see that.  The capsule was not able to be dropped endoscopically today, due to losing related to APC and 5 bands placed, so we will plan for placement at the bedside tomorrow.    RECOMMENDATIONS:  -Patient is at high risk for hepatic encephalopathy in the setting of her recent anesthesia, and GI bleed.  Please titrate lactulose with a goal of having 3-4 bowel movements.  - Carafate QID  - BID PPI  - Clear liquid diet today, 2L [place] this evening, NPO at MN  - Plan for capsule placement tomorrow at bedside. We will inform you want time capsule was placed  - Patient must be NPO for two hours after capsule placement, then can have CLD x 2 hours, then full liquids for the rest of the day.   - Although she has cirrhosis, will hold of on antibiotics now, as she has persistent and recurrent GIB. Low threshold to do infectious workup and start abx if develops a fever or other signs of infection while admitted.   -We will retrieve the recorder and vest tomorrow    The above recommendations were dicussed with the primary team.    Thank you for this consult. The patient was Discussed with and seen by Dr. Foy Guadalajara. We will continue to follow along. Please page hepatology pager [phone]) with questions.     Stephannie Li, MD  Gastroenterology Fellow, PGY-4  [facility]    History of Present Illness:  Chief Complaint: GIB     [phone] w/hx of NAFLD cirrhosis (d/b ascites, HE, EV bleeding), s/p TIPS 06/2018 and embolization of mesocaval varices, w/subsequent worsening HE, also c/b GAVE requiring multiple episodes APC, who presents as direct admission from endoscopy for planned AM capsule endoscopy in s/o chronic GIB. (MELD-Na 15).     She underwent EGD today w/Dr. Darling/Dr. Waynetta Sandy that showed multiple bleeding angiectasias in the stomach and she underwent APC and 5 bands were deployed as well. Capsule was not placed at this time due to the amount of oozing she experienced, with plan for capsule placement tomorrow.     In endoscopy, her hemoglobin was 6.9 (last 8.8), and they were concerned about other source of bleeding as they did not think her seen angiectasias explained entire amount of blood loss. She was transfused 1 unit prbcs.     Regarding her history of liver disease, she follows w/Dr. Ruffin Frederick. She has a known PVT that persists, but has not been able to get [facility] 2/2 recurrent GIB 2/2 GAVE. Regarding HE, she has ahistory of multiple hospitalizations for HE, and it has become more difficult to manage  s/p TIPS. She was evaluated for transplant, but was deemed not a candidate 2/2 frailty at that time.     Review of Systems:  The balance of 12 systems reviewed is negative except as noted in the HPI.     Medical / Surgical / Family / Social History:  Past Medical History:   Diagnosis Date   ??? Cirrhosis (CMS-HCC)    ??? Depression    ??? Diabetes mellitus (CMS-HCC)    ??? Fibromyalgia    ??? GAVE (gastric antral vascular ectasia)    ??? History of transfusion 04/2019    reports frequent blood tranfusions   ??? HTN (hypertension)    ??? Hypercholesterolemia    ??? Hypothyroid    ??? NAFLD (nonalcoholic fatty liver disease)    ??? Osteoporosis      Past Surgical History:   Procedure Laterality Date   ??? APPENDECTOMY     ??? CHOLECYSTECTOMY     ??? IR TIPS  07/08/2018    IR TIPS 07/08/2018 Soledad Gerlach, MD IMG VIR H&V [facility]   ??? PR RIGHT HEART CATH O2 SATURATION & CARDIAC OUTPUT Right 01/18/2018    Procedure: Right Heart Catheterization;  Surgeon: Marlaine Hind, MD;  Location: [facility] CATH;  Service: Cardiology   ??? PR UPPER GI ENDOSCOPY,CTRL BLEED N/A 03/05/2019    Procedure: UGI ENDOSCOPY; WITH CONTROL OF BLEEDING, ANY METHOD;  Surgeon: Andrey Farmer, MD;  [facility]: [facility];  Service: Gastroenterology   ??? PR UPPER GI ENDOSCOPY,CTRL BLEED  04/04/2019    Procedure: UGI ENDOSCOPY; WITH CONTROL OF BLEEDING, ANY METHOD;  Surgeon: Janyth Pupa, MD;  Location: [facility];  Service: Gastroenterology   ??? PR UPPER GI ENDOSCOPY,CTRL BLEED N/A 05/23/2019    Procedure: UGI ENDOSCOPY; WITH CONTROL OF BLEEDING, ANY METHOD;  Surgeon: Janyth Pupa, MD;  Location: [facility];  Service: Gastroenterology   ??? PR UPPER GI ENDOSCOPY,DIAGNOSIS N/A 03/03/2019    Procedure: UGI ENDO, INCLUDE ESOPHAGUS, STOMACH, & DUODENUM &/OR JEJUNUM; DX W/WO COLLECTION SPECIMN, BY BRUSH OR WASH;  Surgeon: Luanne Bras, MD;  Location: [facility];  Service: Gastroenterology   ??? SALPINGOOPHORECTOMY       History reviewed. No pertinent family history.  Medications Prior to Admission   Medication Sig Dispense Refill Last Dose   ??? cholecalciferol, vitamin D3, (CHOLECALCIFEROL) 1,000 unit tablet Take 1,000 Units by mouth Mon,Wed,Fri (2000).   06/12/2019   ??? insulin ASPART (NOVOLOG FLEXPEN U-100 INSULIN) 100 unit/mL injection pen Use with given sliding scale    CBG 70-120: 0 units  CBG 121-150: 1 unit  CBG 151-200: 2 units  CBG 201-250: 3 units  CBG 251-300: 5 units  CBG 301-350: 7 units  CBG greater than 351: 9 units   06/13/2019   ??? lactulose (CHRONULAC) 10 gram/15 mL solution Take 20 g by mouth Four (4) times a day.    06/12/2019   ??? levothyroxine (SYNTHROID, LEVOTHROID) 25 MCG tablet Take 25 mcg by mouth daily.   06/12/2019   ??? magnesium oxide (MAG-OX) 400 mg (241.3 mg magnesium) tablet Take 800 mg by mouth daily. Take for hand and/or leg cramping.    06/13/2019   ??? OXYGEN-AIR DELIVERY SYSTEMS MISC Inhale 2 L nightly.   06/13/2019   ??? pantoprazole (PROTONIX) 40 MG tablet Take 40 mg by mouth Two (2) times a day.    06/12/2019   ??? pediatric multivit-iron-min (FLINTSTONES COMPLETE) tablet Chew 2 tablets daily.  06/12/2019   ??? pen needle, diabetic 31 gauge x 1/4 Ndle    06/13/2019   ??? potassium chloride SA (K-DUR,KLOR-CON) 20 MEQ tablet Take 20 mEq by mouth daily.    06/13/2019   ??? rifAXIMin (XIFAXAN) 550 mg Tab Take 1 tablet (550 mg total) by mouth Two (2) times a day. 60 tablet 11 06/12/2019   ??? rosuvastatin (CRESTOR) 5 MG tablet Take 5 mg by mouth every other day.   06/12/2019   ??? sertraline (ZOLOFT) 50 MG tablet Take 1 tablet (50 mg total) by mouth daily. Please take 1/2 tablet (25mg ) daily by mouth for 4 days, then increase to 1 tablet (50mg ) by mouth daily. 30 tablet 1 06/13/2019   ??? spironolactone (ALDACTONE) 50 MG tablet Take 1 tablet (50 mg total) by mouth daily. 30 tablet 0 06/12/2019   ??? sucralfate (CARAFATE) 100 mg/mL suspension Take 10 mL (1 g total) by mouth Four (4) times a day. 1200 mL 5 06/12/2019   ??? torsemide (DEMADEX) 20 MG tablet Take 20 mg by mouth daily.   06/12/2019   ??? insulin glargine (LANTUS SOLOSTAR U-100 INSULIN) 100 unit/mL (3 mL) injection pen Inject 0.45 mL (45 Units total) under the skin daily. 1350 Units 0      Current Facility-Administered Medications   Medication Dose Route Frequency Provider Last Rate Last Dose   ??? acetaminophen (TYLENOL) tablet 650 mg  650 mg Oral Q4H PRN Orvilla Fus, MD       ??? aluminum-magnesium hydroxide-simethicone (MAALOX MAX) 80-80-8 mg/mL oral suspension  30 mL Oral Q4H PRN Orvilla Fus, MD       ??? atorvastatin (LIPITOR) tablet 10 mg  10 mg Oral Nightly Orvilla Fus, MD       ??? calcium carbonate (TUMS) chewable tablet 400 mg elem calcium  400 mg elem calcium Oral BID PRN Orvilla Fus, MD       ??? cholecalciferol (vitamin D3) tablet 1,000 Units  1,000 Units Oral Daily Nelta Numbers Sasaki-Adams, MD       ??? dextrose 50 % in water (D50W) 50 % solution 12.5 g  12.5 g Intravenous Q10 Min PRN Orvilla Fus, MD       ??? [START ON 06/14/2019] furosemide (LASIX) injection 40 mg  40 mg Intravenous BID Orvilla Fus, MD       ??? guaiFENesin (ROBITUSSIN) oral syrup  200 mg Oral Q4H PRN Orvilla Fus, MD       ??? insulin glargine (BASAGLAR, LANTUS) injection pen 45 Units  45 Units Subcutaneous Daily Orvilla Fus, MD       ??? insulin lispro (HumaLOG) injection 0-12 Units  0-12 Units Subcutaneous ACHS Orvilla Fus, MD       ??? lactated Ringers infusion  10 mL/hr Intravenous Continuous Orvilla Fus, MD 10 mL/hr at 06/13/19 0847 10 mL/hr at 06/13/19 0847   ??? lactulose (CHRONULAC) oral solution  20 g Oral 4x Daily Nelta Numbers Sasaki-Adams, MD       ??? levothyroxine (SYNTHROID) tablet 25 mcg  25 mcg Oral Daily Nelta Numbers Sasaki-Adams, MD       ??? magnesium oxide (MAG-OX) tablet 800 mg  800 mg Oral Daily Nelta Numbers Sasaki-Adams, MD       ??? melatonin tablet 6 mg  6 mg Oral Nightly PRN Orvilla Fus, MD       ??? ondansetron (ZOFRAN-ODT) disintegrating tablet 8 mg  8 mg Oral Q8H PRN Orvilla Fus, MD  Or   ??? ondansetron (ZOFRAN) injection 4 mg  4 mg Intravenous Q8H PRN Orvilla Fus, MD       ??? pantoprazole (PROTONIX) EC tablet 40 mg  40 mg Oral BID Orvilla Fus, MD       ??? pediatric multivit-iron-min tablet 2 tablet  2 tablet Oral Daily Nelta Numbers Sasaki-Adams, MD       ??? peg-electrolyte soln (GoLYTELY) solution 2,000 mL  2,000 mL Oral Once Orvilla Fus, MD       ??? polyethylene glycol (MIRALAX) packet 17 g  17 g Oral Daily PRN Orvilla Fus, MD       ??? potassium chloride (KLOR-CON) CR tablet 20 mEq  20 mEq Oral Daily Nelta Numbers Sasaki-Adams, MD       ??? promethazine (PHENERGAN) injection 6.25 mg  6.25 mg Intravenous Q15 Min PRN Lucila Maine [place], DO       ??? rifAXIMin Burman Blacksmith) tablet 550 mg  550 mg Oral BID Orvilla Fus, MD       ??? senna (SENOKOT) tablet 2 tablet  2 tablet Oral Nightly PRN Orvilla Fus, MD       ??? sertraline (ZOLOFT) tablet 50 mg  50 mg Oral Daily Nelta Numbers Sasaki-Adams, MD       ??? spironolactone (ALDACTONE) tablet 50 mg  50 mg Oral Daily Nelta Numbers Sasaki-Adams, MD       ??? sucralfate (CARAFATE) oral suspension  1 g Oral 4x Daily Orvilla Fus, MD         Allergies   Allergen Reactions   ??? Ferumoxytol      Patient has severe back and chest pain when receiving FERAHEME infusions.   ??? Tramadol Hcl      WEAKNESS     Social History     Tobacco Use   ??? Smoking status: Never Smoker   ??? Smokeless tobacco: Never Used   Substance Use Topics   ??? Alcohol use: No   ??? Drug use: No       Objective:    Temp:  [36.3 ??C-36.8 ??C] 36.8 ??C  Heart Rate:  [80-96] 96  Resp:  [12-19] 12  BP: (122-125)/(44-46) 122/44  SpO2:  [99 %-100 %] 99 %  BMI (Calculated):  [37.92] 37.92    Physical Exam:  General appearance: I am meeting patient in the endoscopy postprocedural recovery area.  She appears elderly and frail, and is just waking up.  HEENT: Anicteric sclera  Cardiovascular: Regular rate  Pulmonary: Normal work of breathing. Acyanotic  Abdominal: soft, nontender, nondistended, no masses or organomegaly.  Musculoskeletal: No temporal wasting. Normal joints of the hand.  Skin: No jaundice. No rashes.  Neurologic: Currently oriented to place and self, unable to say the month, however per reports was oriented x3, and this may be related to anesthesia.    Diagnostic Studies:  Labs:    CBC:  Recent Labs   Lab Units 06/13/19  0917   WBC 10*9/L 2.7*   HEMOGLOBIN g/dL 6.9*   PLATELET COUNT (1) 10*9/L 50*     BMP:No results in the last week  Liver Panel:No results in the last week  Anemia:  MCV   Date Value Ref Range Status   06/13/2019 89.7 80.0 - 100.0 fL Final     RDW   Date Value Ref Range Status   06/13/2019 20.2 (H) 12.0 - 15.0 % Final     Ferritin   Date Value Ref Range Status   11/14/2018  7.0 3.0 - 151.0 ng/mL Final     Iron Saturation (%)   Date Value Ref Range Status   02/28/2019 14 (L) 15 - 50 % Final     TIBC   Date Value Ref Range Status   02/28/2019 298.1 252.0 - 479.0 mg/dL Final     Luminal etiologies:  Hemoglobin A1C   Date Value Ref Range Status   01/02/2018 6.5 (H) 4.8 - 5.6 % Final     TSH   Date Value Ref Range Status   02/28/2019 2.675 0.600 - 3.300 uIU/mL Final     Liver etiologies:  Hep A IgG   Date Value Ref Range Status   07/02/2018 Reactive (A) Nonreactive Final     Hep B Core Total Ab   Date Value Ref Range Status   01/02/2018 Nonreactive Nonreactive Final     Hep B S Ab   Date Value Ref Range Status   01/02/2018 Reactive (A) Nonreactive, Grayzone Final     Comment:       Nonreactive and Grayzone results are considered non-immune.     Hepatitis C Ab   Date Value Ref Range Status   01/02/2018 Nonreactive Nonreactive Final     Comment:       Antibodies to HCV were not detected.  A nonreactive result does not exclude the possibility of exposure to HCV.     Antinuclear Antibodies (ANA)   Date Value Ref Range Status   01/02/2018 Positive (A) Negative Final     Smooth Muscle Ab   Date Value Ref Range Status   01/02/2018 Negative Negative Final     Comment:        -------------------ADDITIONAL INFORMATION-------------------  This test was developed and its performance characteristics   determined by [facility] in a manner consistent with CLIA   requirements. This test has not been cleared or approved by   the [place]. Food and Drug Administration.     Test Performed by:  [facility]  [address] [place], [place], [place] [address]     Anti Mitochondrial Ab   Date Value Ref Range Status   01/02/2018 Negative Negative Final     Anti-Mitochondrial Level   Date Value Ref Range Status   01/02/2018 11.5  Final     Total IgG   Date Value Ref Range Status   08/07/2017 1,579 600-1,700 mg/dL Final     Tumor markers:  AFP-Tumor Marker   Date Value Ref Range Status   07/02/2018 3 <8 ng/mL Final       Imaging:   No results found.    Microbiology:  No results found for: LABBLOO  No results found for: ASCPR  No results found for: LABURIN

## 2019-06-14 LAB — BASIC METABOLIC PANEL
ANION GAP: 6 mmol/L — ABNORMAL LOW (ref 7–15)
ANION GAP: 5 mmol/L — ABNORMAL LOW (ref 7–15)
ANION GAP: 3 mmol/L — ABNORMAL LOW (ref 7–15)
BLOOD UREA NITROGEN: 18 mg/dL (ref 7–21)
BLOOD UREA NITROGEN: 18 mg/dL (ref 7–21)
BLOOD UREA NITROGEN: 19 mg/dL (ref 7–21)
BUN / CREAT RATIO: 19
BUN / CREAT RATIO: 19
BUN / CREAT RATIO: 20
CALCIUM: 7.4 mg/dL — ABNORMAL LOW (ref 8.5–10.2)
CALCIUM: 7.5 mg/dL — ABNORMAL LOW (ref 8.5–10.2)
CALCIUM: 7.4 mg/dL — ABNORMAL LOW (ref 8.5–10.2)
CHLORIDE: 109 mmol/L — ABNORMAL HIGH (ref 98–107)
CHLORIDE: 108 mmol/L — ABNORMAL HIGH (ref 98–107)
CHLORIDE: 111 mmol/L — ABNORMAL HIGH (ref 98–107)
CO2: 21 mmol/L — ABNORMAL LOW (ref 22.0–30.0)
CO2: 21 mmol/L — ABNORMAL LOW (ref 22.0–30.0)
CREATININE: 0.93 mg/dL (ref 0.60–1.00)
CREATININE: 0.96 mg/dL (ref 0.60–1.00)
CREATININE: 0.97 mg/dL (ref 0.60–1.00)
EGFR CKD-EPI AA FEMALE: 75 mL/min/{1.73_m2} (ref >=60)
EGFR CKD-EPI AA FEMALE: 72 mL/min/{1.73_m2} (ref >=60)
EGFR CKD-EPI AA FEMALE: 71 mL/min/{1.73_m2} (ref >=60)
EGFR CKD-EPI NON-AA FEMALE: 65 mL/min/{1.73_m2} (ref >=60)
EGFR CKD-EPI NON-AA FEMALE: 62 mL/min/{1.73_m2} (ref >=60)
GLUCOSE RANDOM: 211 mg/dL — ABNORMAL HIGH (ref 70–179)
GLUCOSE RANDOM: 202 mg/dL — ABNORMAL HIGH (ref 70–179)
POTASSIUM: 3.8 mmol/L (ref 3.5–5.0)
POTASSIUM: 3.5 mmol/L (ref 3.5–5.0)
SODIUM: 134 mmol/L — ABNORMAL LOW (ref 135–145)
SODIUM: 136 mmol/L (ref 135–145)

## 2019-06-14 LAB — CBC
HEMATOCRIT: 25.9 % — ABNORMAL LOW (ref 36.0–46.0)
HEMATOCRIT: 24.9 % — ABNORMAL LOW (ref 36.0–46.0)
HEMOGLOBIN: 8 g/dL — ABNORMAL LOW (ref 12.0–16.0)
HEMOGLOBIN: 7.8 g/dL — ABNORMAL LOW (ref 12.0–16.0)
MEAN CORPUSCULAR HEMOGLOBIN CONC: 31.3 g/dL (ref 31.0–37.0)
MEAN CORPUSCULAR HEMOGLOBIN: 28.3 pg (ref 26.0–34.0)
MEAN CORPUSCULAR HEMOGLOBIN: 28.6 pg (ref 26.0–34.0)
MEAN CORPUSCULAR VOLUME: 91.7 fL (ref 80.0–100.0)
MEAN CORPUSCULAR VOLUME: 91.4 fL (ref 80.0–100.0)
MEAN PLATELET VOLUME: 11.8 fL — ABNORMAL HIGH (ref 7.0–10.0)
MEAN PLATELET VOLUME: 12.4 fL — ABNORMAL HIGH (ref 7.0–10.0)
PLATELET COUNT: 59 10*9/L — ABNORMAL LOW (ref 150–440)
RED BLOOD CELL COUNT: 2.82 10*12/L — ABNORMAL LOW (ref 4.00–5.20)
RED BLOOD CELL COUNT: 2.73 10*12/L — ABNORMAL LOW (ref 4.00–5.20)
RED CELL DISTRIBUTION WIDTH: 19.5 % — ABNORMAL HIGH (ref 12.0–15.0)
WBC ADJUSTED: 7.7 10*9/L (ref 4.5–11.0)
WBC ADJUSTED: 5.4 10*9/L (ref 4.5–11.0)

## 2019-06-14 LAB — RED CELL DISTRIBUTION WIDTH: Lab: 20.2 — ABNORMAL HIGH

## 2019-06-14 LAB — HEPATIC FUNCTION PANEL
ALKALINE PHOSPHATASE: 77 U/L (ref 38–126)
BILIRUBIN DIRECT: 0.5 mg/dL — ABNORMAL HIGH (ref 0.00–0.40)
BILIRUBIN TOTAL: 3.2 mg/dL — ABNORMAL HIGH (ref 0.0–1.2)
PROTEIN TOTAL: 4 g/dL — ABNORMAL LOW (ref 6.5–8.3)

## 2019-06-14 LAB — BLOOD UREA NITROGEN
Urea nitrogen:MCnc:Pt:Ser/Plas:Qn:: 18
Urea nitrogen:MCnc:Pt:Ser/Plas:Qn:: 18

## 2019-06-14 LAB — CHLORIDE: Chloride:SCnc:Pt:Ser/Plas:Qn:: 111 — ABNORMAL HIGH

## 2019-06-14 LAB — AST (SGOT): Aspartate aminotransferase:CCnc:Pt:Ser/Plas:Qn:: 32

## 2019-06-14 LAB — PLATELET COUNT: Platelets:NCnc:Pt:Bld:Qn:Automated count: 59 — ABNORMAL LOW

## 2019-06-14 LAB — FERRITIN: Ferritin:MCnc:Pt:Ser/Plas:Qn:: 10.8

## 2019-06-14 LAB — POTASSIUM: Potassium:SCnc:Pt:Ser/Plas:Qn:: 3.7

## 2019-06-14 NOTE — Unmapped (Signed)
Patient tolerated treatments and medications well with nae throughout the shift.  Vs remain wnl.  Will continue to monitor.    Problem: Adult Inpatient Plan of Care  Goal: Plan of Care Review  Flowsheets (Taken 06/14/2019 0224)  Plan of Care Reviewed With: patient  Goal: Patient-Specific Goal (Individualization)  Flowsheets (Taken 06/14/2019 0224)  Patient-Specific Goals (Include Timeframe): assist to bathroom q2 hours  Individualized Care Needs: monitor bm for procedure  Anxieties, Fears or Concerns: bleeding  Goal: Absence of Hospital-Acquired Illness or Injury  Intervention: Identify and Manage Fall Risk  Flowsheets (Taken 06/14/2019 0224)  Safety Interventions:   fall reduction program maintained   low bed   nonskid shoes/slippers when out of bed  Intervention: Prevent Skin Injury  Flowsheets (Taken 06/14/2019 0224)  Pressure Reduction Techniques: frequent weight shift encouraged  Intervention: Prevent VTE (venous thromboembolism)  Flowsheets (Taken 06/14/2019 0224)  VTE Prevention/Management: ambulation promoted  Intervention: Prevent Infection  Flowsheets (Taken 06/14/2019 0224)  Infection Prevention:   environmental surveillance performed   equipment surfaces disinfected   handwashing promoted   personal protective equipment utilized   rest/sleep promoted

## 2019-06-14 NOTE — Unmapped (Addendum)
Medicine Daily Progress Note    Assessment/Plan:  Principal Problem:    Upper GI bleeding  Active Problems:    NAFLD (nonalcoholic fatty liver disease)    Cirrhosis (CMS-HCC)    GAVE (gastric antral vascular ectasia)    Chronic blood loss anemia  Resolved Problems:    * No resolved hospital problems. *           Marilyn French is a 63 y.o. female with PMHx as noted below that [facility] to [facility] with Upper GI bleeding.  ??  Upper GI bleeding, anemia -  this is likely secondary to gave as well as gastric angiectasias.  Appreciate GI assistance.  She had APC performed 9/25 w/ gave lesions as well as some angiectasia is in her stomach banded.  However she has had ongoing transfusion dependent anemia despite multiple sessions of APC. hemoglobin was 6.9 9/25 which rose appropriately to 1u PRBC to 8. Ferritin level low 10/2018 at 7.   -Undergoing capsule endoscopy (placed on 9/26)    - continue protonix orally 40 BID   - continue carafate QID   - CBC q8 hours  -Check ferritin level, may benefit from IV iron    -Transfuse as needed  ??  NAFLD Cirrhosis with volume overload -she appears volume overloaded and has reported weight increase. Has asym edema in arm but this is not new and has gotten past PVLs. No signs of encephalopathy on exam. Meld score 15.    -Continue Rifamixin   -Continue lactulose   -Continue Aldactone at increased dose of 100mg     - CTN 40 Lasix IV BID   -strict I/O   - Daily weights   - BMP now and in AM to watch K and electrolytes while diuresing.     N/V: May be due to edema causing abdominal fullness vs lactulose +bowel prep.   -Zofran and PRN compazine   -Monitor for improvement with diuresis     Type 2 DM-Holding 45u lantus, treat with 10u currently with poor intake, SSI currently   Depression- Zoloft  Hypothyroidism- Synthroid     Code Status:  Full Code  Hold pharmocological ppx with GI bleed     ___________________________________________________________________    Subjective:  Had several dark BM overnight. Had difficulty with bowel prep due to N/V. Poor appetite continues. Had camera placed this morning     Labs/Studies:  Labs and Studies from the last 24hrs per EMR and Reviewed      Objective:  Temp:  [36.3 ??C (97.3 ??F)-37 ??C (98.6 ??F)] 36.3 ??C (97.3 ??F)  Heart Rate:  [85-92] 85  Resp:  [18-22] 18  BP: (128-145)/(48-60) 139/60  SpO2:  [97 %-100 %] 98 %    GEN: NAD, sitting in chair   EYES: EOMI, mild scleral icterus   ENT: MMM   CV: RRR, no murmur, port in place on R chest without surrounding erythema    PULM: CTA B, normal WOB  ABD: capsule vest on stomach, mildly distended, +BS  EXT: 3+ edema L arm>R, LE edema  Neuro: alert and oriented, no asterixis

## 2019-06-14 NOTE — Unmapped (Signed)
Pt alert and oriented X4. On 2L [place].  Denies pain.  Clear liq diet, NPO midnight per MD. Belongings and call bell near. Will continue to monitor.      Problem: Adult Inpatient Plan of Care  Goal: Plan of Care Review  Outcome: Ongoing - Unchanged  Goal: Patient-Specific Goal (Individualization)  Outcome: Ongoing - Unchanged  Goal: Absence of Hospital-Acquired Illness or Injury  Outcome: Ongoing - Unchanged  Goal: Optimal Comfort and Wellbeing  Outcome: Ongoing - Unchanged  Goal: Readiness for Transition of Care  Outcome: Ongoing - Unchanged  Goal: Rounds/Family Conference  Outcome: Ongoing - Unchanged

## 2019-06-14 NOTE — Unmapped (Signed)
Care Management  Initial Transition Planning Assessment              CM met with patient in pt room.  Pt/visitors were not wearing hospital provided masks for the duration of the interaction with CM.   CM was wearing hospital provided surgical mask and hospital provided eye protection.  CM was not within 6 foot of the patient/visitors during this interaction.     *CM spoke with patient, lives at home with spouse, independent at baseline, uses 2L O2 PRN ([facility]), has cane and RW but doesn't use at this time, retired and on disability, has transport home.  Looks like [facility] was arranged back in June but patient doesn't remember getting HH.    Marilyn French, spouse [phone]    General  Care Manager assessed the patient by : Medical record review, Discussion with Clinical Care team, In person interview with patient  Orientation Level: Oriented X4  Who provides care at home?: N/A  Reason for referral: Discharge Planning    Contact/Decision [facility]  Extended Emergency Contact Information  Primary Emergency Contact: [facility]  Address: [address]           [place], [place] [address] [place]  Home Phone: [phone]  Mobile Phone: [phone]  Relation: Spouse  Preferred language: ENGLISH  Interpreter needed? No  Secondary Emergency Contact: Danella Sensing  Work Phone: [phone]  Relation: Daughter    Legal Next of Kin / Guardian / POA / Advance Directives     Advance Directive (Medical Treatment)  Does patient have an advance directive covering medical treatment?: Patient does not have advance directive covering medical treatment.  Reason patient does not have an advance directive covering medical treatment:: Patient does not wish to complete one at this time.    Health Care Decision Maker [HCDM] (Medical & Mental Health Treatment)  Healthcare Decision Maker: HCDM documented in the HCDM/Contact Info section.  Information offered on HCDM, Medical & Mental Health advance directives:: Patient declined information.  Referral Made: No    Advance Directive (Mental Health Treatment)  Does patient have an advance directive covering mental health treatment?: Patient does not have advance directive covering mental health treatment.  Reason patient does not have an advance directive covering mental health treatment:: Patient does not wish to complete one at this time.    Patient Information  Lives with: Spouse/significant other    Type of Residence: Private residence    Type of Residence: Mailing Address:  [address]  [place] [address]  Contacts:          Medical Provider(s): Ignatius Specking, MD  Reason for Admission: Admitting Diagnosis:  follow up  Past Medical History:   has a past medical history of Cirrhosis (CMS-HCC), Depression, Diabetes mellitus (CMS-HCC), Fibromyalgia, GAVE (gastric antral vascular ectasia), History of transfusion (04/2019), HTN (hypertension), Hypercholesterolemia, Hypothyroid, NAFLD (nonalcoholic fatty liver disease), and Osteoporosis.  Past Surgical History:   has a past surgical history that includes Appendectomy; Cholecystectomy; Salpingoophorectomy; pr right heart cath o2 saturation & cardiac output (Right, 01/18/2018); IR Tips (07/08/2018); pr upper gi endoscopy,diagnosis (N/A, 03/03/2019); pr upper gi endoscopy,ctrl bleed (N/A, 03/05/2019); pr upper gi endoscopy,ctrl bleed (04/04/2019); and pr upper gi endoscopy,ctrl bleed (N/A, 05/23/2019).   Previous admit date: 02/28/2019    Primary Insurance- Payor: Manufacturing engineer / Plan: HEALTH TEAM ADVANTAGE / Product Type: *No Product type* /   Preferred Pharmacy - [facility] - [place], [place] - [phone]  [address]  [facility] - [place], [place] - [address]  [facility]    Transportation home: Private vehicle  Level of function prior to admission: Independent     Location/Detail: [employer]    Support Systems: Family Members  Home Care services in place prior to admission?: No   Equipment Currently Used at Home: oxygen, other (see comments)(2L O2 PRN. Has cane and RW but doesnt use,)  Current HME Agency (Name/Phone #): [facility]    Currently receiving outpatient dialysis?: No   Financial Information   Need for financial assistance?: No   Social Determinants of Health  Social Determinants of Health were addressed in provider documentation.  Please refer to patient history.    Discharge Needs Assessment  Concerns to be Addressed: denies needs/concerns at this time    Clinical Risk Factors: Multiple Diagnoses (Chronic)    Barriers to taking medications: No    Prior overnight hospital stay or ED visit in last 90 days: Yes    Readmission Within the Last 30 Days: no previous admission in last 30 days  Anticipated Changes Related to Illness: none    Equipment Needed After Discharge: none    Discharge Facility/Level of Care Needs:  Home with self care    Readmission  Risk of Unplanned Readmission Score:  %  Predictive Model Details           13% (Medium) Factors Contributing to Score   Calculated 06/14/2019 15:20 37% Number of active Rx orders is 41   [place] Risk of Unplanned Readmission Model 13% Number of hospitalizations in last year is 2     12% Latest calcium is low (7.4 mg/dL)     9% Charlson Comorbidity Index is 6     9% Imaging order is present in last 6 months     8% Latest hemoglobin is low (8.0 g/dL)     7% Age is 63     3% Future appointment is scheduled     Readmitted Within the Last 30 Days? (No if blank)   Patient at risk for readmission?: Yes    Discharge Plan  Screen findings are: Care Manager reviewed the plan of the patient's care with the Multidisciplinary Team. No discharge planning needs identified at this time. Care Manager will continue to manage plan and monitor patient's progress with the team.    Expected Discharge Date: 06/14/2019  Patient and/or family were provided with choice of facilities / services that are available and appropriate to meet post hospital care needs?: N/A       Initial Assessment complete?: Yes

## 2019-06-14 NOTE — Unmapped (Signed)
Pt has no CP distress, on RA. Pt denies any pain. NPO for GI capsule study and clear liquids resume at 0915. Able to tolerate sips of clear liquids. P:t C/o of nausea and zofran po given with relief. Pt have 3 episodes of loose stool , after bowel prep. MD made aware.  VS WNL. Last Fs=178, covered accordingly. Bed alarm for fall risk. Updated with POC.    Problem: Adult Inpatient Plan of Care  Goal: Plan of Care Review  Outcome: Progressing  Goal: Patient-Specific Goal (Individualization)  Outcome: Progressing  Flowsheets (Taken 06/14/2019 1359)  Patient-Specific Goals (Include Timeframe): Pt will have no fall incident during hospitalization 06/16/19.  Goal: Absence of Hospital-Acquired Illness or Injury  Outcome: Progressing  Goal: Optimal Comfort and Wellbeing  Outcome: Progressing  Goal: Readiness for Transition of Care  Outcome: Progressing  Goal: Rounds/Family Conference  Outcome: Progressing     Problem: Fall Injury Risk  Goal: Absence of Fall and Fall-Related Injury  Outcome: Progressing     Problem: Self-Care Deficit  Goal: Improved Ability to Complete Activities of Daily Living  Outcome: Progressing     Problem: Diabetes Comorbidity  Goal: Blood Glucose Level Within Desired Range  Outcome: Ongoing - Unchanged  Intervention: Maintain Glycemic Control  Flowsheets (Taken 06/14/2019 1359)  Glycemic Management: blood glucose monitoring

## 2019-06-15 LAB — WBC ADJUSTED: Leukocytes:NCnc:Pt:Bld:Qn:: 4.7

## 2019-06-15 LAB — COMPREHENSIVE METABOLIC PANEL
ALBUMIN: 1.7 g/dL — ABNORMAL LOW (ref 3.5–5.0)
ALKALINE PHOSPHATASE: 76 U/L (ref 38–126)
ALT (SGPT): 19 U/L (ref <35)
ANION GAP: 2 mmol/L — ABNORMAL LOW (ref 7–15)
AST (SGOT): 24 U/L (ref 14–38)
BILIRUBIN TOTAL: 2.3 mg/dL — ABNORMAL HIGH (ref 0.0–1.2)
BLOOD UREA NITROGEN: 16 mg/dL (ref 7–21)
CALCIUM: 7.3 mg/dL — ABNORMAL LOW (ref 8.5–10.2)
CO2: 24 mmol/L (ref 22.0–30.0)
CREATININE: 1.01 mg/dL — ABNORMAL HIGH (ref 0.60–1.00)
EGFR CKD-EPI AA FEMALE: 68 mL/min/{1.73_m2} (ref >=60)
GLUCOSE RANDOM: 140 mg/dL (ref 70–179)
POTASSIUM: 3.7 mmol/L (ref 3.5–5.0)
PROTEIN TOTAL: 3.9 g/dL — ABNORMAL LOW (ref 6.5–8.3)
SODIUM: 133 mmol/L — ABNORMAL LOW (ref 135–145)

## 2019-06-15 LAB — CBC
HEMATOCRIT: 24.7 % — ABNORMAL LOW (ref 36.0–46.0)
HEMOGLOBIN: 7.6 g/dL — ABNORMAL LOW (ref 12.0–16.0)
HEMOGLOBIN: 7.6 g/dL — ABNORMAL LOW (ref 12.0–16.0)
MEAN CORPUSCULAR HEMOGLOBIN CONC: 30.9 g/dL — ABNORMAL LOW (ref 31.0–37.0)
MEAN CORPUSCULAR HEMOGLOBIN CONC: 31.6 g/dL (ref 31.0–37.0)
MEAN CORPUSCULAR HEMOGLOBIN: 28.3 pg (ref 26.0–34.0)
MEAN CORPUSCULAR HEMOGLOBIN: 29.1 pg (ref 26.0–34.0)
MEAN CORPUSCULAR VOLUME: 91.4 fL (ref 80.0–100.0)
MEAN CORPUSCULAR VOLUME: 92 fL (ref 80.0–100.0)
MEAN PLATELET VOLUME: 11.5 fL — ABNORMAL HIGH (ref 7.0–10.0)
MEAN PLATELET VOLUME: 11.7 fL — ABNORMAL HIGH (ref 7.0–10.0)
PLATELET COUNT: 64 10*9/L — ABNORMAL LOW (ref 150–440)
PLATELET COUNT: 59 10*9/L — ABNORMAL LOW (ref 150–440)
RED BLOOD CELL COUNT: 2.7 10*12/L — ABNORMAL LOW (ref 4.00–5.20)
RED BLOOD CELL COUNT: 2.61 10*12/L — ABNORMAL LOW (ref 4.00–5.20)
RED CELL DISTRIBUTION WIDTH: 20 % — ABNORMAL HIGH (ref 12.0–15.0)
RED CELL DISTRIBUTION WIDTH: 20.4 % — ABNORMAL HIGH (ref 12.0–15.0)
WBC ADJUSTED: 4.7 10*9/L (ref 4.5–11.0)

## 2019-06-15 LAB — MAGNESIUM: Magnesium:MCnc:Pt:Ser/Plas:Qn:: 1.8

## 2019-06-15 LAB — RED CELL DISTRIBUTION WIDTH: Lab: 20.4 — ABNORMAL HIGH

## 2019-06-15 LAB — BLOOD UREA NITROGEN: Urea nitrogen:MCnc:Pt:Ser/Plas:Qn:: 16

## 2019-06-15 NOTE — Unmapped (Addendum)
Medicine Daily Progress Note    Assessment/Plan:  Principal Problem:    Upper GI bleeding  Active Problems:    NAFLD (nonalcoholic fatty liver disease)    Cirrhosis (CMS-HCC)    GAVE (gastric antral vascular ectasia)    Chronic blood loss anemia  Resolved Problems:    * No resolved hospital problems. *           Marilyn French is a 63 y.o. female with medical history of nonbloody cirrhosis, type 2 diabetes, depression, hypothyroidism, chronic GI bleeding with iron deficiency anemia presenting with worsening anemia and volume overload.  ??  Upper GI bleeding, anemia -  this is likely secondary to GAVE.  She had APC performed 9/25 w/ gave lesions as well as some angiectasia is in her stomach banded.  However she has had ongoing transfusion dependent anemia despite multiple sessions of APC. hemoglobin was 6.9 9/25 which rose appropriately to 1u PRBC to 8 it is now stable. Ferritin level low 10/2018 at >10 here. Iron deficiency anemia is likely worsening transfusion dependence.  However, did not tolerate Feraheme infusion in the past.  The reaction sounded less likely to be consistent with anaphylaxis.  Will discuss further with pharmacy on Monday if IV iron would be possible.  -F/u results of capsule endoscopy (placed on 9/26) with GI in the morning and discuss if further interventions are needed   - continue protonix orally 40 BID   - continue carafate QID   - CBC daily   -Per hepatology no antibiotics needed deu to chronic nature of bleed   -Transfuse as needed  ??  NAFLD Cirrhosis with volume overload -she appears volume overloaded and has reported weight increase. Has asym edema in arm but this is not new and has gotten past PVLs which did not show DVT. No signs of encephalopathy on exam. Meld score 15.  No history of past ascites..   -Continue Rifamixi, lactulose   -Continue Aldactone at increased dose of 100mg     - S/p 2 days of  40 Lasix IV BID for volume overload, transition to home torsemide in the morning at increased dose of 40 mg   -strict I/O   - Daily weights    N/V: May be due to edema causing abdominal fullness vs lactulose +bowel prep.  This is improving.  -Zofran and PRN compazine   -Monitor for improvement with diuresis     Type 2 DM-Lantus 25 units with sliding scale insulin (home dose 45 units)  Depression- Zoloft  Hypothyroidism- Synthroid     Code Status:  Full Code  Hold pharmocological ppx with GI bleed     ___________________________________________________________________    Subjective:  No acute events overnight.  She still has some nausea after the procedure which is improving.  Dose of lactulose held last night because the patient had had several loose bowel movements.  Does not have any signs of active GI bleeding.  She reports that she is urinating a lot with the Lasix although I's and O's were not recorded.  Overall, she is feeling improved.    Labs/Studies:  Labs and Studies from the last 24hrs per EMR and Reviewed      Objective:  Temp:  [36.5 ??C (97.7 ??F)-36.7 ??C (98.1 ??F)] 36.7 ??C (98.1 ??F)  Heart Rate:  [75-86] 77  Resp:  [18-20] 18  BP: (122-139)/(50-60) 122/50  SpO2:  [95 %-99 %] 95 %    GEN: NAD, lying in bed  EYES: EOMI, mild scleral icterus  ENT: MMM   CV: RRR, no murmur, port in place on R chest without surrounding erythema    PULM: CTA B, normal WOB  ABD:  mildly distended, no significant tenderness to palpation, +BS  EXT: 3+ edema L arm>R, LE edema  Neuro: alert and oriented, no asterixis

## 2019-06-15 NOTE — Unmapped (Signed)
Adult Nutrition Assessment Note    Visit Type: RN Consult  Reason for Visit: Per Admission Nutrition Screen (Adult), Have you had a decrease in food intake or appetite?    ASSESSMENT:   HPI & PMH: Marilyn French is a 63 y.o. female with PMHx of NAFLD, Cirrhosis that presents to [facility] with Upper GI bleeding.    Nutrition Hx: Patient reports decreased appetite for the past month due to nausea. Her weight has been increasing due to fluid. This morning she was able to tolerate 100% of an omelette, no nausea so far. Agreeable to try high protein supplements.     Nutritionally Pertinent Meds: Spironolactone   Labs: Na 133, Glucose POC 148-192 x 24 hours   Abd/GI: Last BM 09/26 - diarrhea  Skin:   Patient Lines/Drains/Airways Status    Active Wounds     None               Current nutrition therapy order:   Nutrition Orders          Nutrition Therapy Sodium Restricted (2 gm Na+); Fluid 2000 ml; Sodium Restricted (2 gm Na+) starting at 09/27 0739           Anthropometric Data:  -- Height: 162.6 cm (5' 4)   -- Last recorded weight: (!) 104.2 kg (229 lb 11.5 oz)  -- Admission weight: 100.2 kg (221 lb)  -- IBW: 54.49 kg  -- Percent IBW: 183.97 %  -- BMI: Body mass index is 39.43 kg/m??.   -- Weight changes this admission:   Last 5 Recorded Weights    06/13/19 0833 06/13/19 1446 06/14/19 0600   Weight: 100.2 kg (221 lb) (!) 104.5 kg (230 lb 6.1 oz) (!) 104.2 kg (229 lb 11.5 oz)      -- Weight history PTA: Weight up with fluid  Wt Readings from Last 10 Encounters:   06/14/19 (!) 104.2 kg (229 lb 11.5 oz)   05/23/19 92.1 kg (203 lb)   04/04/19 83.9 kg (185 lb)   03/24/19 83.9 kg (185 lb)   02/28/19 93 kg (205 lb)   11/14/18 88.9 kg (196 lb)   11/14/18 88 kg (193 lb 14.4 oz)   08/08/18 83.3 kg (183 lb 11.2 oz)   08/08/18 83.3 kg (183 lb 11.2 oz)   07/09/18 96.5 kg (212 lb 11.9 oz)        Daily Estimated Nutrient Needs:   Energy: 1650-1925 kcals [30-35 kcal/kg using ideal body weight, 55 kg (06/15/19 1023)]  Protein: 66-83 gm [1.2-1.5 gm/kg using ideal body weight, 55 kg (06/15/19 1023)]  Carbohydrate:   [45-60% of kcal]  Fluid:   [per MD team]     Nutrition Focused Physical Exam: Volume overload, will complete after diuresis            DIAGNOSIS:  Malnutrition Assessment using AND/ASPEN Clinical Characteristics:  Pending weight trends, NFPE                          Overall nutrition impression: Patient not meeting needs at this time. Discussed nutrition therapy for N/V, encouraged small, frequent meals.      GOALS:  Oral Intake:       - Patient to consume 50% of 4-5 small meals per day.     RECOMMENDATIONS AND INTERVENTIONS:  1. Continue Na restricted diet  2. Ensure Max Protein 1x/day -- does not count towards fluid restriction  3. Recommend MVI w/ minerals   4. Antiemetics  PRN - take 30 minutes prior to meal when nauseous  5. Weekly weights    RD Follow Up Parameters:  1-2 times per week (and more frequent as indicated)     Lavella Lemons, MS, RD, LDN  Pager: [phone]

## 2019-06-15 NOTE — Unmapped (Signed)
Problem: Adult Inpatient Plan of Care  Goal: Plan of Care Review  Outcome: Progressing  Goal: Patient-Specific Goal (Individualization)  Outcome: Progressing  Goal: Absence of Hospital-Acquired Illness or Injury  Outcome: Progressing  Goal: Optimal Comfort and Wellbeing  Outcome: Progressing  Goal: Readiness for Transition of Care  Outcome: Progressing  Goal: Rounds/Family Conference  Outcome: Progressing     Problem: Fall Injury Risk  Goal: Absence of Fall and Fall-Related Injury  Outcome: Progressing     Problem: Self-Care Deficit  Goal: Improved Ability to Complete Activities of Daily Living  Outcome: Progressing     Problem: Diabetes Comorbidity  Goal: Blood Glucose Level Within Desired Range  Outcome: Progressing     Pt has been afebrile with VS WNL on RA. Pt ambulating safely in room. Pt required 2x units of lispro with HS blood sugar check. Pt also required ODT zofran for nausea which was effective. Will continue to monitor.

## 2019-06-16 ENCOUNTER — Encounter (INDEPENDENT_AMBULATORY_CARE_PROVIDER_SITE_OTHER): Payer: PPO | Admitting: Ophthalmology

## 2019-06-16 ENCOUNTER — Encounter (HOSPITAL_COMMUNITY)
Admission: RE | Admit: 2019-06-16 | Discharge: 2019-06-16 | Disposition: A | Discharge status: Home / Self Care | Payer: PPO | Source: Ambulatory Visit | Attending: Internal Medicine | Admitting: Internal Medicine

## 2019-06-16 DIAGNOSIS — K31819 Angiodysplasia of stomach and duodenum without bleeding: Secondary | ICD-10-CM

## 2019-06-16 LAB — CBC
HEMATOCRIT: 23.3 % — ABNORMAL LOW (ref 36.0–46.0)
HEMOGLOBIN: 7.2 g/dL — ABNORMAL LOW (ref 12.0–16.0)
MEAN CORPUSCULAR HEMOGLOBIN CONC: 30.9 g/dL — ABNORMAL LOW (ref 31.0–37.0)
MEAN CORPUSCULAR VOLUME: 91.8 fL (ref 80.0–100.0)
MEAN PLATELET VOLUME: 11.6 fL — ABNORMAL HIGH (ref 7.0–10.0)
PLATELET COUNT: 59 10*9/L — ABNORMAL LOW (ref 150–440)
RED BLOOD CELL COUNT: 2.54 10*12/L — ABNORMAL LOW (ref 4.00–5.20)
RED CELL DISTRIBUTION WIDTH: 20.5 % — ABNORMAL HIGH (ref 12.0–15.0)
WBC ADJUSTED: 3 10*9/L — ABNORMAL LOW (ref 4.5–11.0)

## 2019-06-16 LAB — COMPREHENSIVE METABOLIC PANEL
ALBUMIN: 1.7 g/dL — ABNORMAL LOW (ref 3.5–5.0)
ALKALINE PHOSPHATASE: 73 U/L (ref 38–126)
ANION GAP: 3 mmol/L — ABNORMAL LOW (ref 7–15)
AST (SGOT): 25 U/L (ref 14–38)
BILIRUBIN TOTAL: 2.1 mg/dL — ABNORMAL HIGH (ref 0.0–1.2)
BLOOD UREA NITROGEN: 15 mg/dL (ref 7–21)
CALCIUM: 7.3 mg/dL — ABNORMAL LOW (ref 8.5–10.2)
CHLORIDE: 107 mmol/L (ref 98–107)
CO2: 24 mmol/L (ref 22.0–30.0)
CREATININE: 0.97 mg/dL (ref 0.60–1.00)
EGFR CKD-EPI AA FEMALE: 71 mL/min/{1.73_m2} (ref >=60)
EGFR CKD-EPI NON-AA FEMALE: 62 mL/min/{1.73_m2} (ref >=60)
GLUCOSE RANDOM: 134 mg/dL (ref 70–179)
POTASSIUM: 3.6 mmol/L (ref 3.5–5.0)
PROTEIN TOTAL: 3.8 g/dL — ABNORMAL LOW (ref 6.5–8.3)
SODIUM: 134 mmol/L — ABNORMAL LOW (ref 135–145)

## 2019-06-16 LAB — PROTIME: Coagulation tissue factor induced:Time:Pt:PPP:Qn:Coag: 19.1 — ABNORMAL HIGH

## 2019-06-16 LAB — PLATELET COUNT: Platelets:NCnc:Pt:Bld:Qn:Automated count: 59 — ABNORMAL LOW

## 2019-06-16 LAB — MAGNESIUM: Magnesium:MCnc:Pt:Ser/Plas:Qn:: 1.7

## 2019-06-16 LAB — PROTIME-INR: PROTIME: 19.1 s — ABNORMAL HIGH (ref 10.2–13.1)

## 2019-06-16 LAB — BILIRUBIN TOTAL: Bilirubin:MCnc:Pt:Ser/Plas:Qn:: 2.1 — ABNORMAL HIGH

## 2019-06-16 NOTE — Unmapped (Addendum)
??    Marilyn French??is a 63 y.o.??female??with medical history of nonbloody cirrhosis, type 2 diabetes, depression, hypothyroidism, chronic GI bleeding with iron deficiency anemia presenting with worsening anemia and volume overload.  ??  Upper GI bleeding, anemia: She was admitted after outpatient EGD  9/25 showed GAVE lesions s/p APC as well as some angiectasia in her stomach which was banded.  Received 2 U PRBCs this admission (9/25,9/28).  She received a capsule study which did not reveal source of bleeding. She continued home BID PPI along with Carafate.  Ferritin level was 10. IV iron was discussed. The patient had developed severe chest pain when given ??ferumoxytol in the past. The decision was made to start iron polysaccharide.  While on this treatment she will need her B12 and folate checked every 30 days to ensure she is not achieving toxic levels.  Antibiotics were not given due to chronic nature of bleed. Hemoglobin stable at *** at discharge.   ??  Decompensated NASH cirrhosis complicated by portal hypertension, ascites, history of hepatic encephalopathy: Hepatology consulted. Patient is s/p TIPS (06/2018). Had significant fluid overload (weight 30 pounds above baseline) on presentation.  Echo showed dilated RA, moderately dilated RV with normal RVSP. TIPS ultrasound 9/30 concerning for intra-TIPS stenosis but given RV findings on TTE would be high risk for complications with TIPS revision. Received escalating doses of diuretics, ultimately responded to Lasix 80mg  IV BID and spironolactone 100 mg daily; transitioned to ***.  No hepatic encephalopathy this admission. Baseline weight is around 200 lbs *** at time of discharge. MELD ***     Nausea: Experienced some nausea throughout admission especially after receiving Lactulose. Responded to PRN Zofran.   ??  Type 2 DM: Lantus was continued at decreased dose and patient was discharged on***    OUTPATIENT FOLLOW UP:  [ ]  every 30 day B12, folate while on iron polysaccharide.

## 2019-06-16 NOTE — Unmapped (Signed)
GASTROENTEROLOGY TREATMENT PLAN NOTE     Requesting Attending Physician :  Verl Bangs, MD    Reason for Consult:    [phone] w/hx of NAFLD cirrhosis (d/b ascites, HE, EV bleeding), s/p TIPS 06/2018 and embolization of mesocaval varices, w/subsequent worsening HE, also c/b GAVE requiring multiple episodes APC, who presents as direct admission from endoscopy for planned AM capsule endoscopy in s/o chronic GIB. (MELD-Na 15).     Patient is seen in consultation at the request of Dr. Nelta Numbers [facility]* for evaluation of chronic GIB  ??  ASSESSMENT: Patient currently presenting with chronic GI bleeding, requiring transfusion today and endoscopy.  The angiectasia's that were seen in her stomach may not explain her recent two-point hemoglobin drop, and there could be another source of her melena and bleeding, and so a capsule endoscopy may be able to further help [place] see that.  The capsule was not able to be dropped endoscopically today, due to losing related to APC and 5 bands placed, so we will plan for placement at the bedside tomorrow.  ??  RECOMMENDATIONS:  -Patient is at high risk for hepatic encephalopathy in the setting of her recent anesthesia, and GI bleed.  Please titrate lactulose with a goal of having 3-4 bowel movements.  - Carafate QID  - BID PPI  - Re-treatment of GAVE in 4 weeks (EGD Ordered)  - Please transfuse 1 unit prbcs, and IV iron prior to discharge  - Continue local monitoring of CBCs/transfusions as needed    Thank you for including [place] in the care of your patient. Patient discussed with Dr. Piedad Climes.  Please page the Hepatology consult pager at [phone] with any questions.    Stephannie Li, MD  Gastroenterology Fellow, PGY-4  [facility]

## 2019-06-16 NOTE — Unmapped (Signed)
Problem: Adult Inpatient Plan of Care  Goal: Plan of Care Review  Outcome: Progressing  Goal: Patient-Specific Goal (Individualization)  Outcome: Progressing  Goal: Absence of Hospital-Acquired Illness or Injury  Outcome: Progressing  Goal: Optimal Comfort and Wellbeing  Outcome: Progressing  Goal: Readiness for Transition of Care  Outcome: Progressing  Goal: Rounds/Family Conference  Outcome: Progressing     Problem: Fall Injury Risk  Goal: Absence of Fall and Fall-Related Injury  Outcome: Progressing     Problem: Self-Care Deficit  Goal: Improved Ability to Complete Activities of Daily Living  Outcome: Progressing     Problem: Diabetes Comorbidity  Goal: Blood Glucose Level Within Desired Range  Outcome: Progressing    Pt has been afebrile with VS WNL on RA. No adverse events this shift. Pt required 2 units of lispro for HS coverage. Pt had a BM and is urinating appropriately. Will continue to monitor.

## 2019-06-16 NOTE — Unmapped (Signed)
POC reviewed with pt; all questions answered. BP 143/46 this afternoon, MD notified. Other VSS. Blood admin in progress. Pt reported pain well controlled this shift. Given zofran for nausea. No emesis reported. Adequate urine output today. Continues to have BMs this shift. BG monitored. Remains free from falls and other injury. Will continue to monitor.      Problem: Adult Inpatient Plan of Care  Goal: Plan of Care Review  Outcome: Progressing  Goal: Patient-Specific Goal (Individualization)  Outcome: Progressing  Goal: Absence of Hospital-Acquired Illness or Injury  Outcome: Progressing  Goal: Optimal Comfort and Wellbeing  Outcome: Progressing  Goal: Readiness for Transition of Care  Outcome: Progressing  Goal: Rounds/Family Conference  Outcome: Progressing     Problem: Fall Injury Risk  Goal: Absence of Fall and Fall-Related Injury  Outcome: Progressing     Problem: Self-Care Deficit  Goal: Improved Ability to Complete Activities of Daily Living  Outcome: Progressing     Problem: Diabetes Comorbidity  Goal: Blood Glucose Level Within Desired Range  Outcome: Progressing

## 2019-06-16 NOTE — Unmapped (Signed)
Medicine Daily Progress Note    Assessment/Plan:  Principal Problem:    Upper GI bleeding  Active Problems:    NAFLD (nonalcoholic fatty liver disease)    Cirrhosis (CMS-HCC)    GAVE (gastric antral vascular ectasia)    Chronic blood loss anemia  Resolved Problems:    * No resolved hospital problems. *           Marilyn French is a 63 y.o. female with medical history of NASH cirrhosis, type 2 diabetes, depression, hypothyroidism, chronic GI bleeding with iron deficiency anemia presenting with worsening anemia and volume overload.  ??  Chronic UGI bleeding due to GAVE  Acute on chronic iron deficiency anemia:   APC performed 9/25 for treatment of GAVE as well as angiectasia is her stomach banded. However she has had ongoing transfusion dependent anemia despite multiple sessions of APC. Hgb 6.9 9/25 which rose appropriately to 1u PRBC to 8 it is now stable. Ferritin < 20 consistent with iron deficiency anemia. However, did not tolerate Feraheme infusion in the past. The reaction does not sound like anaphylaxis but occurred twice per patient.   - appreciate pharmacy assistance re: IV iron formulation option  - Hgb 7.2 this AM; will give 1U PRBCs as will also provide iron load ~ 200mg    - capsule without etiology to explain ongoing anemia; no further inpatient procedures planned per GI   - continue protonix orally 40 BID  - continue carafate QID  - CBC daily   - Per hepatology no antibiotics for SBP prophylaxis needed due to chronic nature of bleed  - trial oral iron today  ??  Decompensated NASH cirrhosis with portal hypertension, ascites, volume overload, h/o HSE   Volume overloaded on exam and has reported weight increase. Asym edema in arm but this is not new and has gotten past PVLs which did not show DVT. No signs of encephalopathy on exam. Meld score 14.  No history of past ascites..   -Continue Rifamixin and lactulose 20mg  TID (titrate to 3-4 BMs daily)   -Continue Aldactone at increased dose of 100mg     - S/p 2 days of  40 Lasix IV BID for volume overload, transitioned to torsemide 40mg  daily today (home was 20mg  daily)    - strict I/O, Daily weights, 2g Na/2L fluid restricted diet    N/V: May be due to edema causing abdominal fullness vs lactulose +bowel prep.  This is improving.  -Zofran and PRN compazine   -Monitor for improvement with diuresis     Type 2 DM-  - reduce Lantus to 10U QAM due to ongoing nausea (home dose 45U)  - lispro SSI    Depression- Zoloft  Hypothyroidism- Synthroid     Code Status:  Full Code    DVT prophylaxis: SCDs given GI bleed     ___________________________________________________________________    Subjective:  No acute events overnight. Thinks she has been urinating a good deal but hasn't been collecting. Has only had a bed weight since admission. Feeling more nauseated since the APC procedure but hasn't vomited. Had 4-5 BMs yesterday without blood. No dyspnea, just fatigued. No fevers, chills, headache, abdominal pain.     Labs/Studies:  Labs and Studies from the last 24hrs per EMR and Reviewed    MELD-Na score: 17 at 06/16/2019 10:01 AM  MELD score: 14 at 06/16/2019 10:01 AM  Calculated from:  Serum Creatinine: 0.97 mg/dL (Rounded to 1 mg/dL) at 1/61/0960  4:54 AM  Serum Sodium: 134 mmol/L at 06/16/2019  5:49 AM  Total Bilirubin: 2.1 mg/dL at 1/61/0960  4:54 AM  INR(ratio): 1.64 at 06/16/2019 10:01 AM  Age: 54 years 1 month      Objective:  Temp:  [36.5 ??C (97.7 ??F)-36.9 ??C (98.4 ??F)] 36.9 ??C (98.4 ??F)  Heart Rate:  [61-77] 61  Resp:  [16-20] 16  BP: (122-143)/(40-59) 143/46  SpO2:  [94 %-98 %] 96 %    GEN: NAD, lying flat in bed  EYES: EOMI, mild scleral icterus   ENT: MMM   Chest: port in place on R chest without surrounding erythema    CV: normal rate, regular rhythm, no murmur  PULM: CTAB, normal WOB  ABD: mildly distended, no significant tenderness to palpation, +BS  EXT: 3+ edema L arm>R, LE edema  Neuro: alert and oriented, no asterixis

## 2019-06-17 LAB — CBC
HEMATOCRIT: 25.7 % — ABNORMAL LOW (ref 36.0–46.0)
HEMOGLOBIN: 8.1 g/dL — ABNORMAL LOW (ref 12.0–16.0)
MEAN CORPUSCULAR HEMOGLOBIN CONC: 31.6 g/dL (ref 31.0–37.0)
MEAN CORPUSCULAR HEMOGLOBIN: 28.8 pg (ref 26.0–34.0)
MEAN PLATELET VOLUME: 11.8 fL — ABNORMAL HIGH (ref 7.0–10.0)
RED BLOOD CELL COUNT: 2.82 10*12/L — ABNORMAL LOW (ref 4.00–5.20)
RED CELL DISTRIBUTION WIDTH: 19.4 % — ABNORMAL HIGH (ref 12.0–15.0)
WBC ADJUSTED: 2.6 10*9/L — ABNORMAL LOW (ref 4.5–11.0)

## 2019-06-17 LAB — COMPREHENSIVE METABOLIC PANEL
ALBUMIN: 1.7 g/dL — ABNORMAL LOW (ref 3.5–5.0)
ALKALINE PHOSPHATASE: 74 U/L (ref 38–126)
ANION GAP: 4 mmol/L — ABNORMAL LOW (ref 7–15)
AST (SGOT): 24 U/L (ref 14–38)
BILIRUBIN TOTAL: 2 mg/dL — ABNORMAL HIGH (ref 0.0–1.2)
BLOOD UREA NITROGEN: 14 mg/dL (ref 7–21)
BUN / CREAT RATIO: 12
CALCIUM: 7.4 mg/dL — ABNORMAL LOW (ref 8.5–10.2)
CHLORIDE: 107 mmol/L (ref 98–107)
CO2: 25 mmol/L (ref 22.0–30.0)
CREATININE: 1.18 mg/dL — ABNORMAL HIGH (ref 0.60–1.00)
EGFR CKD-EPI AA FEMALE: 56 mL/min/{1.73_m2} — ABNORMAL LOW (ref >=60)
EGFR CKD-EPI NON-AA FEMALE: 49 mL/min/{1.73_m2} — ABNORMAL LOW (ref >=60)
GLUCOSE RANDOM: 139 mg/dL (ref 70–179)
POTASSIUM: 3.6 mmol/L (ref 3.5–5.0)
PROTEIN TOTAL: 3.9 g/dL — ABNORMAL LOW (ref 6.5–8.3)

## 2019-06-17 LAB — BUN / CREAT RATIO: Urea nitrogen/Creatinine:MRto:Pt:Ser/Plas:Qn:: 12

## 2019-06-17 LAB — INR: Lab: 1.71

## 2019-06-17 LAB — MEAN CORPUSCULAR HEMOGLOBIN: Lab: 28.8

## 2019-06-17 NOTE — Unmapped (Signed)
Medicine Daily Progress Note    Assessment/Plan:  Principal Problem:    Upper GI bleeding  Active Problems:    NAFLD (nonalcoholic fatty liver disease)    Cirrhosis (CMS-HCC)    GAVE (gastric antral vascular ectasia)    Chronic blood loss anemia  Resolved Problems:    * No resolved hospital problems. *           Marilyn French is a 63 y.o. female with medical history of NASH cirrhosis, type 2 diabetes, depression, hypothyroidism, chronic GI bleeding with iron deficiency anemia presenting with worsening anemia and volume overload.  ??  Chronic UGI bleeding due to GAVE  Acute on chronic iron deficiency anemia:   APC performed 9/25 for treatment of GAVE as well as angiectasia is her stomach banded. However she has had ongoing transfusion dependent anemia despite multiple sessions of APC. Hgb 6.9 9/25 which rose appropriately to 1u PRBC to 8 it is now stable. Ferritin < 20 consistent with iron deficiency anemia. However, did not tolerate Feraheme infusion in the past. The reaction does not sound like anaphylaxis but occurred twice per patient.   - appreciate pharmacy assistance re: IV iron formulation option  - Hgb 8.1 this AM after additional 1U PRBCs on 09/28 (2nd unit in total)    - capsule without etiology to explain ongoing anemia; no further inpatient procedures planned per GI   - continue protonix PO 40 BID and carafate QID  - CBC daily   - Per hepatology no antibiotics for SBP prophylaxis needed due to chronic nature of bleed  - continue oral iron; will not trial IV iron   ??  Decompensated NASH cirrhosis with portal hypertension, ascites, volume overload, h/o HSE   Volume overloaded on exam and has reported weight increase. Asym edema in arm but this is not new and has gotten past PVLs which did not show DVT. No signs of encephalopathy on exam. Meld score 14.  No history of past ascites..   - continue Rifamixin and lactulose 20mg  TID (titrate to 3-4 BMs daily)   - continue Aldactone at increased dose of 100mg  - S/p 2 days of  40 Lasix IV BID for volume overload, transitioned to torsemide 40mg  daily on 09/28 (home was 20mg  daily); given weight > 30# over prior will resume IV diuresis today as failing oral attempts at present    - strict I/O, Daily weights, 2g Na/2L fluid restricted diet    N/V: May be due to edema causing abdominal fullness vs lactulose +bowel prep.  This is improving.  - Zofran and PRN compazine   - Monitor for improvement with diuresis     Type 2 DM- glucoses above goal with 10U Lantus yesterday  - increase Lantus to 15U QAM due to ongoing nausea (home dose 45U)  - lispro SSI    Depression- Zoloft  Hypothyroidism- Synthroid     Code Status:  Full Code    DVT prophylaxis: SCDs given GI bleed     ___________________________________________________________________    Subjective:  No acute events overnight. Again thinks she has been having good UOP but not recorded. Thinks that her swelling is better in her legs. When I weighed her at bedside, she was astounded to find that her weight was 227# which is well above her usual weight of 185. Last weight in clinic was 203# on 09/04. Does feel dyspneic with exertion. Nausea improved today, using zofran before she eats. Having BMs, light brown in color. No fevers, chills, headache, abdominal  pain.     Labs/Studies:  Labs and Studies from the last 24hrs per EMR and Reviewed    MELD-Na score: 18 at 06/17/2019  4:58 AM  MELD score: 17 at 06/17/2019  4:58 AM  Calculated from:  Serum Creatinine: 1.18 mg/dL at 0/98/1191  4:78 AM  Serum Sodium: 136 mmol/L at 06/17/2019  4:58 AM  Total Bilirubin: 2.0 mg/dL at 2/95/6213  0:86 AM  INR(ratio): 1.71 at 06/17/2019  4:58 AM  Age: 1 years 1 month    Weight - 227# on standing scale (performed personally)    Objective:  Temp:  [36.6 ??C (97.9 ??F)-36.9 ??C (98.4 ??F)] 36.7 ??C (98.1 ??F)  Heart Rate:  [61-74] 72  Resp:  [16-18] 18  BP: (130-143)/(46-83) 134/56  SpO2:  [93 %-97 %] 93 %    GEN: NAD, lying flat in bed  EYES: EOMI, mild scleral icterus   ENT: MMM   Chest: port in place on R chest without surrounding erythema    CV: normal rate, regular rhythm, no murmur  PULM: CTAB, normal WOB  ABD: mildly distended, no significant tenderness to palpation, +BS  EXT: 3+ edema L arm>R, 1+ LE and flank edema  Neuro: alert and oriented, no asterixis

## 2019-06-17 NOTE — Unmapped (Signed)
Pt is A&Ox4. No complaints of pain. Uses BSC. Turns self in bed. Pt encouraged to call for any needs. Will continue to monitor.    Problem: Adult Inpatient Plan of Care  Goal: Plan of Care Review  Outcome: Progressing  Flowsheets (Taken 06/17/2019 0009)  Progress: improving  Plan of Care Reviewed With: patient  Goal: Patient-Specific Goal (Individualization)  Outcome: Progressing  Flowsheets (Taken 06/17/2019 0009)  Patient-Specific Goals (Include Timeframe): pt will remain free from falls during shift  Individualized Care Needs: falls prevention  Anxieties, Fears or Concerns: pt voiced concern over her diagnosis  Goal: Absence of Hospital-Acquired Illness or Injury  Outcome: Progressing  Goal: Optimal Comfort and Wellbeing  Outcome: Progressing  Goal: Readiness for Transition of Care  Outcome: Progressing  Goal: Rounds/Family Conference  Outcome: Progressing     Problem: Fall Injury Risk  Goal: Absence of Fall and Fall-Related Injury  Outcome: Progressing     Problem: Self-Care Deficit  Goal: Improved Ability to Complete Activities of Daily Living  Outcome: Progressing     Problem: Diabetes Comorbidity  Goal: Blood Glucose Level Within Desired Range  Outcome: Progressing

## 2019-06-18 DIAGNOSIS — I10 Essential (primary) hypertension: Secondary | ICD-10-CM

## 2019-06-18 DIAGNOSIS — I272 Pulmonary hypertension, unspecified: Secondary | ICD-10-CM

## 2019-06-18 DIAGNOSIS — Z9049 Acquired absence of other specified parts of digestive tract: Secondary | ICD-10-CM

## 2019-06-18 DIAGNOSIS — M810 Age-related osteoporosis without current pathological fracture: Secondary | ICD-10-CM

## 2019-06-18 DIAGNOSIS — I491 Atrial premature depolarization: Secondary | ICD-10-CM

## 2019-06-18 DIAGNOSIS — K449 Diaphragmatic hernia without obstruction or gangrene: Secondary | ICD-10-CM

## 2019-06-18 DIAGNOSIS — M797 Fibromyalgia: Secondary | ICD-10-CM

## 2019-06-18 DIAGNOSIS — E119 Type 2 diabetes mellitus without complications: Secondary | ICD-10-CM

## 2019-06-18 DIAGNOSIS — E7800 Pure hypercholesterolemia, unspecified: Secondary | ICD-10-CM

## 2019-06-18 DIAGNOSIS — Z6837 Body mass index (BMI) 37.0-37.9, adult: Secondary | ICD-10-CM

## 2019-06-18 DIAGNOSIS — F329 Major depressive disorder, single episode, unspecified: Secondary | ICD-10-CM

## 2019-06-18 DIAGNOSIS — K31811 Angiodysplasia of stomach and duodenum with bleeding: Secondary | ICD-10-CM

## 2019-06-18 DIAGNOSIS — E440 Moderate protein-calorie malnutrition: Secondary | ICD-10-CM

## 2019-06-18 DIAGNOSIS — D500 Iron deficiency anemia secondary to blood loss (chronic): Secondary | ICD-10-CM

## 2019-06-18 DIAGNOSIS — K760 Fatty (change of) liver, not elsewhere classified: Secondary | ICD-10-CM

## 2019-06-18 DIAGNOSIS — E8809 Other disorders of plasma-protein metabolism, not elsewhere classified: Secondary | ICD-10-CM

## 2019-06-18 DIAGNOSIS — Z794 Long term (current) use of insulin: Secondary | ICD-10-CM

## 2019-06-18 DIAGNOSIS — K7460 Unspecified cirrhosis of liver: Secondary | ICD-10-CM

## 2019-06-18 DIAGNOSIS — E039 Hypothyroidism, unspecified: Secondary | ICD-10-CM

## 2019-06-18 DIAGNOSIS — K31819 Angiodysplasia of stomach and duodenum without bleeding: Secondary | ICD-10-CM

## 2019-06-18 LAB — CBC
HEMATOCRIT: 27.1 % — ABNORMAL LOW (ref 36.0–46.0)
HEMOGLOBIN: 8.5 g/dL — ABNORMAL LOW (ref 12.0–16.0)
MEAN CORPUSCULAR HEMOGLOBIN CONC: 31.5 g/dL (ref 31.0–37.0)
MEAN CORPUSCULAR VOLUME: 91.9 fL (ref 80.0–100.0)
MEAN PLATELET VOLUME: 12.2 fL — ABNORMAL HIGH (ref 7.0–10.0)
PLATELET COUNT: 44 10*9/L — ABNORMAL LOW (ref 150–440)
RED BLOOD CELL COUNT: 2.95 10*12/L — ABNORMAL LOW (ref 4.00–5.20)
RED CELL DISTRIBUTION WIDTH: 19.6 % — ABNORMAL HIGH (ref 12.0–15.0)
WBC ADJUSTED: 2.6 10*9/L — ABNORMAL LOW (ref 4.5–11.0)

## 2019-06-18 LAB — URINALYSIS
BILIRUBIN UA: NEGATIVE
BLOOD UA: NEGATIVE
GLUCOSE UA: NEGATIVE
KETONES UA: NEGATIVE
LEUKOCYTE ESTERASE UA: NEGATIVE
NITRITE UA: NEGATIVE
PH UA: 5 (ref 5.0–9.0)
PROTEIN UA: NEGATIVE
RBC UA: <1 /HPF (ref <=4)
SPECIFIC GRAVITY UA: 1.006 (ref 1.003–1.030)
SQUAMOUS EPITHELIAL: <1 /HPF (ref 0–5)
WBC UA: <1 /HPF (ref 0–5)

## 2019-06-18 LAB — BASIC METABOLIC PANEL
ANION GAP: 2 mmol/L — ABNORMAL LOW (ref 7–15)
BLOOD UREA NITROGEN: 16 mg/dL (ref 7–21)
BUN / CREAT RATIO: 14
CALCIUM: 7.7 mg/dL — ABNORMAL LOW (ref 8.5–10.2)
CHLORIDE: 109 mmol/L — ABNORMAL HIGH (ref 98–107)
CO2: 25 mmol/L (ref 22.0–30.0)
CREATININE: 1.13 mg/dL — ABNORMAL HIGH (ref 0.60–1.00)
EGFR CKD-EPI NON-AA FEMALE: 51 mL/min/{1.73_m2} — ABNORMAL LOW (ref >=60)
GLUCOSE RANDOM: 134 mg/dL (ref 70–179)
SODIUM: 136 mmol/L (ref 135–145)

## 2019-06-18 LAB — PROTIME-INR: INR: 1.53

## 2019-06-18 LAB — BACTERIA

## 2019-06-18 LAB — INR: Lab: 1.53

## 2019-06-18 LAB — BILIRUBIN TOTAL: Bilirubin:MCnc:Pt:Ser/Plas:Qn:: 1.9 — ABNORMAL HIGH

## 2019-06-18 LAB — BUN / CREAT RATIO: Urea nitrogen/Creatinine:MRto:Pt:Ser/Plas:Qn:: 14

## 2019-06-18 LAB — MAGNESIUM: Magnesium:MCnc:Pt:Ser/Plas:Qn:: 1.8

## 2019-06-18 LAB — MEAN PLATELET VOLUME: Lab: 12.2 — ABNORMAL HIGH

## 2019-06-18 NOTE — Unmapped (Signed)
Patient a&ox4. VSS. Complained of nausea without emesis and given 4mg  of zofran. Reported relief after. Adequate urine output today. BG monitored and coverage provided. Adequate appetite. No complaints at this time. Will continue to monitor.    Problem: Adult Inpatient Plan of Care  Goal: Plan of Care Review  Outcome: Progressing  Goal: Patient-Specific Goal (Individualization)  Outcome: Progressing  Goal: Absence of Hospital-Acquired Illness or Injury  Outcome: Progressing  Goal: Optimal Comfort and Wellbeing  Outcome: Progressing  Goal: Readiness for Transition of Care  Outcome: Progressing  Goal: Rounds/Family Conference  Outcome: Progressing     Problem: Fall Injury Risk  Goal: Absence of Fall and Fall-Related Injury  Outcome: Progressing     Problem: Self-Care Deficit  Goal: Improved Ability to Complete Activities of Daily Living  Outcome: Progressing     Problem: Diabetes Comorbidity  Goal: Blood Glucose Level Within Desired Range  Outcome: Progressing

## 2019-06-18 NOTE — Unmapped (Cosign Needed)
GASTROENTEROLOGY TREATMENT PLAN NOTE     Requesting Attending Physician :  Verl Bangs, MD    Reason for Consult:    [phone] w/hx of NAFLD cirrhosis (d/b ascites, HE, EV bleeding), s/p TIPS 06/2018 and embolization of mesocaval varices, w/subsequent worsening HE, also c/b GAVE requiring multiple episodes APC, who presents as direct admission from endoscopy for planned AM capsule endoscopy in s/o chronic GIB. (MELD-Na 15).     Patient is seen in consultation at the request of Dr. Nelta Numbers [facility] for evaluation of chronic GIB  ??  ASSESSMENT: Patient currently presenting with chronic GI bleeding, requiring transfusion today and endoscopy.  The angiectasia's that were seen in her stomach may not explain her recent two-point hemoglobin drop, and there could be another source of her melena and bleeding, and so a capsule endoscopy may be able to further help [place] see that.  The capsule was not able to be dropped endoscopically today, due to losing related to APC and 5 bands placed, so we will plan for placement at the bedside tomorrow.  ??  RECOMMENDATIONS:  -Patient is at high risk for hepatic encephalopathy in the setting of her recent anesthesia, and GI bleed.  Please titrate lactulose with a goal of having 3-4 bowel movements.  - Carafate QID  - BID PPI  - Re-treatment of GAVE in 4 weeks (EGD Ordered)  - Check [place] TIPS Doppler. If normal flow, ascites/volume overload related to portal HTN would be unlikely and so could consider cardiac disease - would recommend repeat echo, versus third spacing related to low albumin.   - Nutrition consult/Supplements  - Consider LUE ultrasound to evaluate for DVT    Thank you for including [place] in the care of your patient. Patient discussed with Dr. Piedad Climes.  Please page the Hepatology consult pager at [phone] with any questions.    Stephannie Li, MD  Gastroenterology Fellow, PGY-4  [facility]           Medications:  Current Facility-Administered Medications   Medication Dose Route Frequency Provider Last Rate Last Dose   ??? acetaminophen (TYLENOL) tablet 500 mg  500 mg Oral Q6H PRN Pixie Casino, MD       ??? aluminum-magnesium hydroxide-simethicone (MAALOX MAX) 80-80-8 mg/mL oral suspension  30 mL Oral Q4H PRN Orvilla Fus, MD       ??? atorvastatin (LIPITOR) tablet 10 mg  10 mg Oral Nightly Orvilla Fus, MD   10 mg at 06/17/19 2018   ??? calcium carbonate (TUMS) chewable tablet 400 mg elem calcium  400 mg elem calcium Oral BID PRN Orvilla Fus, MD       ??? cholecalciferol (vitamin D3) tablet 1,000 Units  1,000 Units Oral Daily Orvilla Fus, MD   1,000 Units at 06/18/19 0940   ??? dextrose 50 % in water (D50W) 50 % solution 12.5 g  12.5 g Intravenous Q10 Min PRN Nelta Numbers Sasaki-Adams, MD       ??? furosemide (LASIX) injection 60 mg  60 mg Intravenous BID Verl Bangs, MD   60 mg at 06/18/19 1427   ??? guaiFENesin (ROBITUSSIN) oral syrup  200 mg Oral Q4H PRN Orvilla Fus, MD       ??? influenza vaccine quad (FLUARIX, FLULAVAL, FLUZONE) (6 MOS & UP) 2020-21  0.5 mL Intramuscular During hospitalization Orvilla Fus, MD       ??? insulin glargine (LANTUS) injection 15 Units  15 Units Subcutaneous daily Riki Rusk  Delanna Notice, MD   15 Units at 06/18/19 [phone]   ??? insulin lispro (HumaLOG) injection 0-12 Units  0-12 Units Subcutaneous ACHS Orvilla Fus, MD   2 Units at 06/18/19 1426   ??? lactated Ringers infusion  10 mL/hr Intravenous Continuous Nelta Numbers Sasaki-Adams, MD 10 mL/hr at 06/13/19 0847 10 mL/hr at 06/13/19 0847   ??? lactulose (CHRONULAC) oral solution (30 mL cup)  20 g Oral TID Pixie Casino, MD   20 g at 06/18/19 1426   ??? levothyroxine (SYNTHROID) tablet 25 mcg  25 mcg Oral daily Nelta Numbers Sasaki-Adams, MD   25 mcg at 06/18/19 0522   ??? magnesium oxide (MAG-OX) tablet 800 mg  800 mg Oral Daily Nelta Numbers Sasaki-Adams, MD   800 mg at 06/18/19 0940   ??? melatonin tablet 6 mg  6 mg Oral Nightly PRN Nelta Numbers Sasaki-Adams, MD       ??? ondansetron (ZOFRAN-ODT) disintegrating tablet 8 mg  8 mg Oral Q8H PRN Orvilla Fus, MD   8 mg at 06/16/19 1353    Or   ??? ondansetron (ZOFRAN) injection 4 mg  4 mg Intravenous Q8H PRN Orvilla Fus, MD   4 mg at 06/17/19 1317   ??? pantoprazole (PROTONIX) EC tablet 40 mg  40 mg Oral BID Orvilla Fus, MD   40 mg at 06/18/19 0940   ??? phytonadione (vitamin K1) (MEPHYTON) tablet 10 mg  10 mg Oral Daily Verl Bangs, MD   10 mg at 06/18/19 9604   ??? polysaccharide iron complex (NIFEREX) capsule 150 mg  150 mg Oral Daily Verl Bangs, MD   150 mg at 06/18/19 1427   ??? potassium chloride (KLOR-CON) CR tablet 20 mEq  20 mEq Oral Daily Orvilla Fus, MD   20 mEq at 06/18/19 5409   ??? prochlorperazine (COMPAZINE) 10 mg in sodium chloride (NS) 0.9 % 25 mL IVPB  10 mg Intravenous Q8H PRN Pixie Casino, MD       ??? rifAXIMin Burman Blacksmith) tablet 550 mg  550 mg Oral BID Orvilla Fus, MD   550 mg at 06/18/19 0941   ??? senna (SENOKOT) tablet 2 tablet  2 tablet Oral Nightly PRN Orvilla Fus, MD       ??? sertraline (ZOLOFT) tablet 50 mg  50 mg Oral Daily Orvilla Fus, MD   50 mg at 06/18/19 8119   ??? sodium chloride (NS) 0.9 % infusion   Intravenous Continuous Verl Bangs, MD   500 mL at 06/16/19 1300   ??? spironolactone (ALDACTONE) tablet 100 mg  100 mg Oral Daily Pixie Casino, MD   100 mg at 06/18/19 1478   ??? sucralfate (CARAFATE) oral suspension  1 g Oral 4x Daily Orvilla Fus, MD   1 g at 06/18/19 1426       Vital Signs:  Temp:  [36.6 ??C-36.8 ??C] 36.7 ??C  Heart Rate:  [65-71] 65  Resp:  [16-18] 18  BP: (131-133)/(46-56) 133/46  MAP (mmHg):  [68-77] 68  SpO2:  [94 %-96 %] 94 %    Intake/Output last 3 shifts:  I/O last 3 completed shifts:  In: 300 [P.O.:300]  Out: 300 [Urine:300]    Physical Exam:  General appearance: Appears well, no distress.  HEENT: Anicteric sclera  Cardiovascular: Regular rate  Pulmonary: Normal work of breathing. Acyanotic  Abdominal: soft, mild distention, no fluid wave, no masses or organomegaly.  Musculoskeletal: No temporal wasting. 3+ bilateral pitting edema  of LEs, LUE 3+ pitting edema (greater than RUE)  Skin: No jaundice. No rashes.  Neurologic: Alert, oriented, and appropriate. No Asterixis.   Psychiatric: Appropriate.    Diagnostic Studies:   Labs:  Recent Labs     06/16/19  0549 06/17/19  0458 06/18/19  0524   WBC 3.0* 2.6* 2.6*   HGB 7.2* 8.1* 8.5*   HCT 23.3* 25.7* 27.1*   PLT 59* 52* 44*     Recent Labs     06/16/19  0549 06/17/19  0458 06/18/19  0524   NA 134* 136 136   K 3.6 3.6 3.5   CL 107 107 109*   BUN 15 14 16    CREATININE 0.97 1.18* 1.13*   GLU 134 139 134     Recent Labs     06/16/19  0549 06/17/19  0458 06/18/19  0524   PROT 3.8* 3.9*  --    ALBUMIN 1.7* 1.7*  --    AST 25 24  --    ALT 18 19  --    ALKPHOS 73 74  --    BILITOT 2.1* 2.0* 1.9*     Recent Labs     06/16/19  1001 06/17/19  0458 06/18/19  0524   INR 1.64 1.71 1.53

## 2019-06-18 NOTE — Unmapped (Signed)
Pt is A&Ox4. No concerns or complaints of pain. Uses BSC. Ate dinner. Currently resting in bed. Pt encouraged to call for any needs. Will continue to monitor.    Problem: Adult Inpatient Plan of Care  Goal: Plan of Care Review  Outcome: Progressing  Flowsheets (Taken 06/17/2019 2242)  Progress: improving  Plan of Care Reviewed With: patient  Goal: Patient-Specific Goal (Individualization)  Outcome: Progressing  Flowsheets (Taken 06/17/2019 2242)  Patient-Specific Goals (Include Timeframe): pt will be discharged this week  Individualized Care Needs:   falls prevention   monitor labs  Anxieties, Fears or Concerns: None stated this shift  Goal: Absence of Hospital-Acquired Illness or Injury  Outcome: Progressing  Goal: Optimal Comfort and Wellbeing  Outcome: Progressing  Goal: Readiness for Transition of Care  Outcome: Progressing  Goal: Rounds/Family Conference  Outcome: Progressing     Problem: Fall Injury Risk  Goal: Absence of Fall and Fall-Related Injury  Outcome: Progressing     Problem: Self-Care Deficit  Goal: Improved Ability to Complete Activities of Daily Living  Outcome: Progressing     Problem: Diabetes Comorbidity  Goal: Blood Glucose Level Within Desired Range  Outcome: Progressing

## 2019-06-18 NOTE — Unmapped (Addendum)
Medicine Daily Progress Note    Assessment/Plan:  Principal Problem:    Upper GI bleeding  Active Problems:    NAFLD (nonalcoholic fatty liver disease)    Cirrhosis (CMS-HCC)    GAVE (gastric antral vascular ectasia)    Chronic blood loss anemia  Resolved Problems:    * No resolved hospital problems. *           Marilyn French is a 63 y.o. female with medical history of NASH cirrhosis, type 2 diabetes, depression, hypothyroidism, chronic GI bleeding with iron deficiency anemia presenting with worsening anemia and volume overload.  ??  Decompensated NASH cirrhosis with portal hypertension/ascites s/p TIPS (06/2018), h/o HSE  Volume overloaded on exam and has reported weight increase. Asym edema in arm but this is not new and has gotten past PVLs which did not show DVT. No signs of encephalopathy on exam. Meld score 14.  No history of past ascites. S/p 2 days of  40 Lasix IV BID for volume overload, transitioned to torsemide 40mg  daily on 09/28 (home was 20mg  daily) but has not had progress. Weight remained > 30# over prior so resumed IV diuresis with 40mg  IV Lasix BID on 09/29. Weight stable again today so will increase dosage.   - increase Lasix to 60mg  IV BID     - continue Rifamixin and lactulose 20mg  TID (titrate to 3-4 BMs daily)   - continue Aldactone at increased dose of 100mg     - strict I/O, Daily weights, 2g Na/2L fluid restricted diet  - given h/o TIPS should not have this much trouble with fluid so will obtain TIPS doppler to assess patency    Chronic UGI bleeding due to GAVE  Acute on chronic iron deficiency anemia:   APC performed 9/25 for treatment of GAVE as well as angiectasia is her stomach banded. However she has had ongoing transfusion dependent anemia despite multiple sessions of APC. Hgb 6.9 9/25 which rose appropriately to 1u PRBC to 8 it is now stable. Ferritin < 20 consistent with iron deficiency anemia. However, did not tolerate Feraheme infusion in the past. The reaction does not sound like anaphylaxis but occurred twice per patient.   - appreciate pharmacy assistance re: IV iron formulation option  - Hgb 8.1 this AM after additional 1U PRBCs on 09/28 (2nd unit in total)    - capsule without etiology to explain ongoing anemia; no further inpatient procedures planned per GI   - continue protonix PO 40 BID and carafate QID  - CBC daily   - Per hepatology no antibiotics for SBP prophylaxis needed due to chronic nature of bleed  - continue oral iron polysaccharide; also contains B12/folate so these must be measured as well as outpatient to ensure not achieving toxic levels    - in prescription -- enter: follow H/H, iron panel, Folate/B12 levels before re-ordering each 30 day supply.   ??  N/V: May be due to edema causing abdominal fullness vs lactulose +bowel prep. Improving.  - Zofran and PRN compazine   - Monitor for improvement with diuresis     Type 2 DM- glucoses now at goal with 15U Lantus yesterday  - continue Lantus 15U QAM due to ongoing nausea (home dose 45U)  - lispro SSI    Depression- Zoloft  Hypothyroidism- Synthroid     Code Status:  Full Code    DVT prophylaxis: SCDs given GI bleed     ___________________________________________________________________    Subjective:  No acute events overnight. Thinks she urinated  a good bit but weight stable this AM. Feels more heaviness and swelling in her abdomen. Dyspneic with ambulating/getting OOB. Some dizziness when first changing positions. Having appropriate # of bowel movements. No bleeding. Urine smells foul but no dysuria. No fevers, chills, headache, abdominal pain.     Labs/Studies:  Labs and Studies from the last 24hrs per EMR and Reviewed    MELD-Na score: 15 at 06/18/2019  5:24 AM  MELD score: 14 at 06/18/2019  5:24 AM  Calculated from:  Serum Creatinine: 1.13 mg/dL at 2/95/6213  0:86 AM  Serum Sodium: 136 mmol/L at 06/18/2019  5:24 AM  Total Bilirubin: 1.9 mg/dL at 5/78/4696  2:95 AM  INR(ratio): 1.53 at 06/18/2019  5:24 AM  Age: 63 years 1 month    Weight - 227# on standing scale    Weight Change from Previous Day: 0.439 kg (15.5 oz)      Objective:  Temp:  [36.6 ??C (97.9 ??F)-36.8 ??C (98.2 ??F)] 36.8 ??C (98.2 ??F)  Heart Rate:  [68-71] 68  Resp:  [16-18] 16  BP: (99-132)/(48-84) 131/56  SpO2:  [94 %-96 %] 94 %    GEN: NAD, lying flat in bed  EYES: EOMI, mild scleral icterus   ENT: MMM   Chest: port in place on R chest without surrounding erythema    CV: normal rate, regular rhythm, no murmur  PULM: CTAB, normal WOB  ABD: mildly distended, no significant tenderness to palpation, +BS  EXT: 3+ edema L arm>R, 1+ LE and flank edema  Neuro: alert and oriented, no asterixis

## 2019-06-19 LAB — BASIC METABOLIC PANEL
ANION GAP: 2 mmol/L — ABNORMAL LOW (ref 7–15)
BUN / CREAT RATIO: 17
CALCIUM: 7.9 mg/dL — ABNORMAL LOW (ref 8.5–10.2)
CHLORIDE: 110 mmol/L — ABNORMAL HIGH (ref 98–107)
CO2: 26 mmol/L (ref 22.0–30.0)
CREATININE: 0.93 mg/dL (ref 0.60–1.00)
EGFR CKD-EPI NON-AA FEMALE: 65 mL/min/{1.73_m2} (ref >=60)
GLUCOSE RANDOM: 148 mg/dL (ref 70–179)
POTASSIUM: 3.6 mmol/L (ref 3.5–5.0)
SODIUM: 138 mmol/L (ref 135–145)

## 2019-06-19 LAB — PROTIME-INR: INR: 1.62

## 2019-06-19 LAB — MEAN CORPUSCULAR VOLUME: Lab: 92.2

## 2019-06-19 LAB — CALCIUM: Calcium:MCnc:Pt:Ser/Plas:Qn:: 7.9 — ABNORMAL LOW

## 2019-06-19 LAB — MAGNESIUM: Magnesium:MCnc:Pt:Ser/Plas:Qn:: 1.8

## 2019-06-19 LAB — CBC
HEMATOCRIT: 26.9 % — ABNORMAL LOW (ref 36.0–46.0)
HEMOGLOBIN: 8.2 g/dL — ABNORMAL LOW (ref 12.0–16.0)
MEAN CORPUSCULAR HEMOGLOBIN CONC: 30.6 g/dL — ABNORMAL LOW (ref 31.0–37.0)
MEAN CORPUSCULAR HEMOGLOBIN: 28.2 pg (ref 26.0–34.0)
MEAN CORPUSCULAR VOLUME: 92.2 fL (ref 80.0–100.0)
MEAN PLATELET VOLUME: 11.9 fL — ABNORMAL HIGH (ref 7.0–10.0)
RED BLOOD CELL COUNT: 2.92 10*12/L — ABNORMAL LOW (ref 4.00–5.20)
RED CELL DISTRIBUTION WIDTH: 19.1 % — ABNORMAL HIGH (ref 12.0–15.0)

## 2019-06-19 LAB — BILIRUBIN TOTAL: Bilirubin:MCnc:Pt:Ser/Plas:Qn:: 1.6 — ABNORMAL HIGH

## 2019-06-19 LAB — PROTIME: Coagulation tissue factor induced:Time:Pt:PPP:Qn:Coag: 18.8 — ABNORMAL HIGH

## 2019-06-19 NOTE — Unmapped (Signed)
Medicine Daily Progress Note    Assessment/Plan:  Principal Problem:    Upper GI bleeding  Active Problems:    NAFLD (nonalcoholic fatty liver disease)    Cirrhosis (CMS-HCC)    GAVE (gastric antral vascular ectasia)    Chronic blood loss anemia  Resolved Problems:    * No resolved hospital problems. *           Marilyn French is a 63 y.o. female who presented to [facility] with Upper GI bleeding.    Decompensated NASH cirrhosis complicated by portal hypertension, ascites, history of hepatic encephalopathy: Hepatology consulted. Patient is s/p TIPS (06/2018). Currently with significant fluid overload.  TIPS ultrasound 9/30 showed patent shunt.  Has received 2days of  40 Lasix??IV BID for volume overload, transitioned to torsemide 40mg  daily on 09/28 (home was 20mg  daily) without significant diuresis so resumed Lasix 40 mg IV BID on 9/29, again little progress so dose increased 9/30.  Unable to get accurate I/Os. Weight down from 103.2 to 101.7 this morning. No current signs of hepatic encephalopathy. Baseline weight is around 200 lbs. MELD 14 (10/1)  - continue Lasix   - continue home spironolactone at increased dose (home dose 50 mg QD)  - Echo pending to rule out cardiac cause of volume overload  - continue Lactulose, titrate to 3-4 BMs daily, continue rifaximin  - daily weight, attempt strict I/Os  - 2G Na diet , 2L fluid restriction    Chronic UGIB secondary to GAVE, acute on chronic iron deficiency and blood loss anemia: APC performed 9/25 for treatment of GAVE as well as 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 However she has had ongoing transfusion dependent anemia despite multiple sessions of APC. Has received 2 U PRBCs this admission (9/25,9/28). Ferritin < 20 consistent with iron deficiency anemia. However, did not tolerate Feraheme infusion in the past. The reaction does not sound like anaphylaxis but occurred twice per patient.   - continue protonix PO 40 BID and carafate QID  - CBC daily   - Per hepatology no antibiotics for SBP prophylaxis needed due to chronic nature of bleed  - continue oral iron polysaccharide; also contains B12/folate so these must be measured as well as outpatient to ensure not achieving toxic levels  (at discharge in prescription enter: follow H/H, iron panel, folate, B12 levles before re-ordering each 30 day supply)  ??  Nausea: May be due to edema causing abdominal fullness vs lactulose  - Zofran and PRN compazine   - Monitor for improvement with diuresis   ??  Type 2 DM:  - continue Lantus 15U QAM due to ongoing nausea (home dose 45U)  - Lispro SSI  ??  Depression:   - continue home Zoloft    Hypothyroidism:  - continue home Synthroid     ___________________________________________________________________    Subjective:  Feeling nauseous because she just took Lactulose and all her pills. Otherwise feels swelling is stable and no other complaints.     Labs/Studies:  Labs and Studies from the last 24hrs per EMR and Reviewed    Objective:  Temp:  [36.6 ??C (97.9 ??F)-36.7 ??C (98.1 ??F)] 36.6 ??C (97.9 ??F)  Heart Rate:  [65-97] 69  Resp:  [14-20] 14  BP: (116-133)/(46-57) 129/52  SpO2:  [94 %-97 %] 95 %    GEN: Lying in bed, no acute distress  HEENT: sclera anicteric, moist mucous membranes  CV: RRR, no murmur  RESP: CTAB, normal WOB  ABD: soft, non-tender, non-distended,  BS normoactive  EXT:  2+ piting edema to BUE L>R swelling, 2+ BLE edema  NEURO: non-focal

## 2019-06-19 NOTE — Unmapped (Signed)
HEPATOLOGY INPATIENT TREATMENT PLAN NOTE     Interval History:     - [place] TIPS shows possible concern for intra-TIPS stenosis    Assessment:      67F w/hx of NAFLD cirrhosis (d/b ascites, HE, EV bleeding), s/p TIPS 06/2018??and embolization of mesocaval varices,??w/subsequent worsening HE, also c/b GAVE requiring multiple episodes APC, who presents as direct admission from endoscopy for planned AM capsule endoscopy in s/o chronic GIB. Capsule showed clinically insignificant angio ectasia in the iluem. No evidence of further bleeding. Hepatology now also following diffuse anasarca/volume overload.     For workup of volume overload in the setting of patient having a TIPS a [place] TIPS was done which showed findings concerning for intra-TIPS stenosis. Of note, the blood flow was hepatofugal through the TIPS and hepatopetal through the portal vein which also indicates possible TIPS malfunction. While this is a possible cause for her volume overload we would expect this to cause more issues with ascites instead of diffuse anasarca like she is presenting with. TTE to evaluate for possible heart failure etiology of volume overload is pending. Another possible cause of her diffuse anasarca is simply worsening of her low albumin state (albumin 1.7, was high 2s several months ago).     We will await the TTE results to evaluate for other etiologies of her volume overload. In addition it is important to know her heart function prior to any plan for intervention of TIPS (TIPS can worsen heart failure).   ??  RECOMMENDATIONS:  - f/u TTE  - after this has been completed we will discuss if any intervention is needed for her TIPS  - continue lactulose titrated to goal of 3-4 bowel movements daily and continue rifaximin  - agree with diuresis  - Carafate QID  - BID PPI  - Re-treatment of GAVE in 4 weeks (EGD Ordered)  - outpatient hepatology follow up has been arranged    Thank you for this consult. They patient was Discussed with  Dr. Piedad Climes. We will continue to follow along. Please page hepatology fellow on call with questions.     Hyman Bible, MD  Gastroenterology & Hepatology Fellow, PGY-4  [facility]

## 2019-06-19 NOTE — Unmapped (Signed)
Pt is A&Ox4. Pleasant. Uses BSC. No complaints of pain. Urine sample sent down to lab per order. Pt encouraged to call with any needs. Will continue to monitor.     Problem: Adult Inpatient Plan of Care  Goal: Plan of Care Review  Outcome: Progressing  Flowsheets (Taken 06/19/2019 0235)  Progress: improving  Plan of Care Reviewed With: patient  Goal: Patient-Specific Goal (Individualization)  Outcome: Progressing  Flowsheets (Taken 06/19/2019 0235)  Patient-Specific Goals (Include Timeframe): pt will be discharged this week  Individualized Care Needs: monitor labs, falls prevention  Anxieties, Fears or Concerns: None stated this shift  Goal: Absence of Hospital-Acquired Illness or Injury  Outcome: Progressing  Goal: Optimal Comfort and Wellbeing  Outcome: Progressing  Goal: Readiness for Transition of Care  Outcome: Progressing  Goal: Rounds/Family Conference  Outcome: Progressing     Problem: Fall Injury Risk  Goal: Absence of Fall and Fall-Related Injury  Outcome: Progressing     Problem: Self-Care Deficit  Goal: Improved Ability to Complete Activities of Daily Living  Outcome: Progressing     Problem: Diabetes Comorbidity  Goal: Blood Glucose Level Within Desired Range  Outcome: Progressing

## 2019-06-19 NOTE — Unmapped (Signed)
A&O x4.  VSs stable, denies pain.  Voiding on BSC, no BM today.  TOlerating solid food.  CT done today.  Remains free of falls/injury.  Problem: Adult Inpatient Plan of Care  Goal: Plan of Care Review  Outcome: Ongoing - Unchanged  Goal: Patient-Specific Goal (Individualization)  Outcome: Ongoing - Unchanged  Goal: Absence of Hospital-Acquired Illness or Injury  Outcome: Ongoing - Unchanged  Goal: Optimal Comfort and Wellbeing  Outcome: Ongoing - Unchanged  Goal: Readiness for Transition of Care  Outcome: Ongoing - Unchanged  Goal: Rounds/Family Conference  Outcome: Ongoing - Unchanged     Problem: Fall Injury Risk  Goal: Absence of Fall and Fall-Related Injury  Outcome: Ongoing - Unchanged     Problem: Self-Care Deficit  Goal: Improved Ability to Complete Activities of Daily Living  Outcome: Ongoing - Unchanged     Problem: Diabetes Comorbidity  Goal: Blood Glucose Level Within Desired Range  Outcome: Ongoing - Unchanged

## 2019-06-20 DIAGNOSIS — Z6837 Body mass index (BMI) 37.0-37.9, adult: Secondary | ICD-10-CM

## 2019-06-20 DIAGNOSIS — D500 Iron deficiency anemia secondary to blood loss (chronic): Secondary | ICD-10-CM

## 2019-06-20 DIAGNOSIS — K31819 Angiodysplasia of stomach and duodenum without bleeding: Secondary | ICD-10-CM

## 2019-06-20 DIAGNOSIS — I491 Atrial premature depolarization: Secondary | ICD-10-CM

## 2019-06-20 DIAGNOSIS — E8809 Other disorders of plasma-protein metabolism, not elsewhere classified: Secondary | ICD-10-CM

## 2019-06-20 DIAGNOSIS — K7460 Unspecified cirrhosis of liver: Secondary | ICD-10-CM

## 2019-06-20 DIAGNOSIS — I10 Essential (primary) hypertension: Secondary | ICD-10-CM

## 2019-06-20 DIAGNOSIS — I272 Pulmonary hypertension, unspecified: Secondary | ICD-10-CM

## 2019-06-20 DIAGNOSIS — K760 Fatty (change of) liver, not elsewhere classified: Secondary | ICD-10-CM

## 2019-06-20 DIAGNOSIS — E440 Moderate protein-calorie malnutrition: Secondary | ICD-10-CM

## 2019-06-20 DIAGNOSIS — Z9049 Acquired absence of other specified parts of digestive tract: Secondary | ICD-10-CM

## 2019-06-20 DIAGNOSIS — E7800 Pure hypercholesterolemia, unspecified: Secondary | ICD-10-CM

## 2019-06-20 DIAGNOSIS — M797 Fibromyalgia: Secondary | ICD-10-CM

## 2019-06-20 DIAGNOSIS — M810 Age-related osteoporosis without current pathological fracture: Secondary | ICD-10-CM

## 2019-06-20 DIAGNOSIS — E039 Hypothyroidism, unspecified: Secondary | ICD-10-CM

## 2019-06-20 DIAGNOSIS — K31811 Angiodysplasia of stomach and duodenum with bleeding: Secondary | ICD-10-CM

## 2019-06-20 DIAGNOSIS — F329 Major depressive disorder, single episode, unspecified: Secondary | ICD-10-CM

## 2019-06-20 DIAGNOSIS — Z794 Long term (current) use of insulin: Secondary | ICD-10-CM

## 2019-06-20 DIAGNOSIS — E119 Type 2 diabetes mellitus without complications: Secondary | ICD-10-CM

## 2019-06-20 DIAGNOSIS — K449 Diaphragmatic hernia without obstruction or gangrene: Secondary | ICD-10-CM

## 2019-06-20 LAB — BASIC METABOLIC PANEL
ANION GAP: 2 mmol/L — ABNORMAL LOW (ref 7–15)
BLOOD UREA NITROGEN: 17 mg/dL (ref 7–21)
BUN / CREAT RATIO: 17
CALCIUM: 7.6 mg/dL — ABNORMAL LOW (ref 8.5–10.2)
CHLORIDE: 105 mmol/L (ref 98–107)
CO2: 29 mmol/L (ref 22.0–30.0)
EGFR CKD-EPI AA FEMALE: 68 mL/min/{1.73_m2} (ref >=60)
EGFR CKD-EPI NON-AA FEMALE: 59 mL/min/{1.73_m2} — ABNORMAL LOW (ref >=60)
GLUCOSE RANDOM: 144 mg/dL — ABNORMAL HIGH (ref 70–99)
SODIUM: 136 mmol/L (ref 135–145)

## 2019-06-20 LAB — CBC
HEMOGLOBIN: 7.9 g/dL — ABNORMAL LOW (ref 12.0–16.0)
MEAN CORPUSCULAR HEMOGLOBIN: 28 pg (ref 26.0–34.0)
MEAN CORPUSCULAR VOLUME: 91.8 fL (ref 80.0–100.0)
MEAN PLATELET VOLUME: 13.1 fL — ABNORMAL HIGH (ref 7.0–10.0)
PLATELET COUNT: 43 10*9/L — ABNORMAL LOW (ref 150–440)
RED BLOOD CELL COUNT: 2.83 10*12/L — ABNORMAL LOW (ref 4.00–5.20)
RED CELL DISTRIBUTION WIDTH: 18.9 % — ABNORMAL HIGH (ref 12.0–15.0)
WBC ADJUSTED: 2.6 10*9/L — ABNORMAL LOW (ref 4.5–11.0)

## 2019-06-20 LAB — HEMOGLOBIN: Hemoglobin:MCnc:Pt:Bld:Qn:: 7.9 — ABNORMAL LOW

## 2019-06-20 LAB — CO2: Carbon dioxide:SCnc:Pt:Ser/Plas:Qn:: 29

## 2019-06-20 LAB — PROTIME-INR: INR: 1.69

## 2019-06-20 LAB — PROTIME: Coagulation tissue factor induced:Time:Pt:PPP:Qn:Coag: 19.6 — ABNORMAL HIGH

## 2019-06-20 LAB — BILIRUBIN TOTAL: Bilirubin:MCnc:Pt:Ser/Plas:Qn:: 1.6 — ABNORMAL HIGH

## 2019-06-20 LAB — MAGNESIUM: Magnesium:MCnc:Pt:Ser/Plas:Qn:: 1.8

## 2019-06-20 NOTE — Unmapped (Signed)
Medicine Daily Progress Note    Assessment/Plan:  Principal Problem:    Upper GI bleeding  Active Problems:    NAFLD (nonalcoholic fatty liver disease)    Cirrhosis (CMS-HCC)    GAVE (gastric antral vascular ectasia)    Chronic blood loss anemia  Resolved Problems:    * No resolved hospital problems. *           Marilyn French is a 63 y.o. female who presented to [facility] with Upper GI bleeding.    Decompensated NASH cirrhosis complicated by portal hypertension, ascites, history of hepatic encephalopathy: Hepatology consulted. Patient is s/p TIPS (06/2018). Currently with significant fluid overload.  TIPS ultrasound 9/30 concerning for possible intra-TIPS stenosis/ TIPS malfunction.  Has received 2days of  40 Lasix??IV BID for volume overload, transitioned to torsemide 40mg  daily on 09/28 (home was 20mg  daily) without significant diuresis so resumed Lasix 40 mg IV BID on 9/29, again little progress so dose increased 9/30.  Unable to get accurate I/Os. Weight down from 103.2 to 101.7 but no change this morning (10/2). No current signs of hepatic encephalopathy. Baseline weight is around 200 lbs. MELD 15 (10/2)  - continue Lasix   - continue home spironolactone at increased dose (home dose 50 mg QD)  - Echo pending to rule out cardiac cause of volume overload. Per hepatology, would expect to see more ascites than diffuse anasarca if TIPS malfunction were the only etiology.   - continue Lactulose, titrate to 3-4 BMs daily, continue rifaximin  - daily weight, attempt strict I/Os  - 2G Na diet , 2L fluid restriction    Chronic UGIB secondary to GAVE, acute on chronic iron deficiency and blood loss anemia: APC performed 9/25 for treatment of GAVE as well as 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 However she has had ongoing transfusion dependent anemia despite multiple sessions of APC. Has received 2 U PRBCs this admission (9/25,9/28). Ferritin < 20 consistent with iron deficiency anemia. Did not tolerate Feraheme infusion in the past. The reaction does not sound like anaphylaxis but occurred twice per patient.   - continue protonix PO 40 BID and carafate QID  - CBC daily   - Per hepatology no antibiotics for SBP prophylaxis needed due to chronic nature of bleed  - continue oral iron polysaccharide; also contains B12/folate so these must be measured as well as outpatient to ensure not achieving toxic levels  (at discharge in prescription enter: follow H/H, iron panel, folate, B12 levles before re-ordering each 30 day supply)  ??  Nausea: May be due to edema causing abdominal fullness vs lactulose  - Zofran and PRN compazine   - Monitor for improvement with diuresis   ??  Type 2 DM:  - continue Lantus 15U QAM due to ongoing nausea (home dose 45U)  - Lispro SSI  ??  Depression:   - continue home Zoloft    Hypothyroidism:  - continue home Synthroid     ___________________________________________________________________    Subjective:  Feeling overall improved. Having ~3 BMs daily. Feels swelling is stable.   Labs/Studies:  Labs and Studies from the last 24hrs per EMR and Reviewed    Objective:  Temp:  [36.6 ??C (97.9 ??F)-36.7 ??C (98.1 ??F)] 36.7 ??C (98.1 ??F)  Heart Rate:  [67-68] 68  Resp:  [18] 18  BP: (117-131)/(46-50) 131/46  SpO2:  [95 %-98 %] 96 %    GEN: Lying in bed, no acute distress  HEENT: sclera anicteric, moist mucous membranes  CV: RRR, no murmur  RESP: CTAB, normal WOB  ABD: soft, non-tender, non-distended,  BS normoactive  EXT: 2+ piting edema to BUE L>R swelling, 3+ BLE edema  NEURO: non-focal

## 2019-06-20 NOTE — Unmapped (Signed)
Problem: Adult Inpatient Plan of Care  Goal: Plan of Care Review  Outcome: Progressing  Goal: Patient-Specific Goal (Individualization)  Outcome: Progressing  Goal: Absence of Hospital-Acquired Illness or Injury  Outcome: Progressing  Goal: Optimal Comfort and Wellbeing  Outcome: Progressing  Goal: Readiness for Transition of Care  Outcome: Progressing  Goal: Rounds/Family Conference  Outcome: Progressing     Problem: Fall Injury Risk  Goal: Absence of Fall and Fall-Related Injury  Outcome: Progressing     Problem: Self-Care Deficit  Goal: Improved Ability to Complete Activities of Daily Living  Outcome: Progressing     Problem: Diabetes Comorbidity  Goal: Blood Glucose Level Within Desired Range  Outcome: Progressing

## 2019-06-20 NOTE — Unmapped (Signed)
HEPATOLOGY INPATIENT FOLLOW UP NOTE     Requesting Attending Physician:  Terri Piedra, [place]*    Reason for Consult:  Marilyn French is a 63 y.o. female seen in consultation at the request of Dr. Terri Piedra, AG* for cirrhosis    Interval History:  - still volume overloaded  - awaiting TTE      ASSESSMENT / PLAN:     ??  57F w/hx of NAFLD cirrhosis (d/b ascites, HE, EV bleeding), s/p TIPS 06/2018??and embolization of mesocaval varices,??w/subsequent worsening HE, also c/b GAVE requiring multiple episodes APC, who presents as direct admission from endoscopy for planned AM capsule endoscopy in s/o chronic GIB. Capsule showed clinically insignificant angio ectasia in the iluem. No evidence of further bleeding. Hepatology now also following diffuse anasarca/volume overload.   ??  For workup of volume overload in the setting of patient having a TIPS a [place] TIPS was done which showed findings concerning for intra-TIPS stenosis. Of note, the blood flow was hepatofugal through the TIPS and hepatopetal through the portal vein which also indicates possible TIPS malfunction. While this is a possible cause for her volume overload we would expect this to cause more issues with ascites instead of diffuse anasarca like she is presenting with. TTE to evaluate for possible heart failure etiology of volume overload is pending. Another possible cause of her diffuse anasarca is simply worsening of her low albumin state (albumin 1.7, was high 2s several months ago). This is due to a combination of her liver disease and malnutrition.  ??  We will await the TTE results to evaluate for other etiologies of her volume overload. In addition it is important to know her heart function prior to any plan for intervention of TIPS (TIPS can worsen heart failure).   ??  RECOMMENDATIONS:    #Anasarca  #LE edema  #Hypoalbuminemia  - f/u TTE  - after this has been completed we will discuss if any intervention is needed for her TIPS  - continue lactulose titrated to goal of 3-4 bowel movements daily and continue rifaximin  - encourage high protein intake given hypoalbuminemia, agree with nutritional supplements  - agree with diuresis   - strict intake/output   - would also start albumin 25% 25g BID to assist with diuresis and for kidney protection    LUE > RUE edema:  - recommend upper extremities dopplers to evaluate for DVT as explanation for asymmetric edema    GAVE:  - Carafate QID  - BID PPI  - Re-treatment of GAVE in 4 weeks (EGD Ordered)    Outpatient hepatology follow up has been arranged.    Thank you for this consult. The patient was Discussed with and seen by Dr. Piedad Climes. We will continue to follow along. Please page hepatology fellow on call with questions.     Hyman Bible, MD  Gastroenterology & Hepatology Fellow, PGY-4  [facility]           Medications:  Current Facility-Administered Medications   Medication Dose Route Frequency Provider Last Rate Last Dose   ??? acetaminophen (TYLENOL) tablet 500 mg  500 mg Oral Q6H PRN Pixie Casino, MD       ??? aluminum-magnesium hydroxide-simethicone (MAALOX MAX) 80-80-8 mg/mL oral suspension  30 mL Oral Q4H PRN Orvilla Fus, MD       ??? atorvastatin (LIPITOR) tablet 10 mg  10 mg Oral Nightly Orvilla Fus, MD   10 mg at 06/19/19 2057   ???  calcium carbonate (TUMS) chewable tablet 400 mg elem calcium  400 mg elem calcium Oral BID PRN Orvilla Fus, MD       ??? cholecalciferol (vitamin D3) tablet 1,000 Units  1,000 Units Oral Daily Orvilla Fus, MD   1,000 Units at 06/20/19 0850   ??? dextrose 50 % in water (D50W) 50 % solution 12.5 g  12.5 g Intravenous Q10 Min PRN Nelta Numbers Sasaki-Adams, MD       ??? furosemide (LASIX) injection 60 mg  60 mg Intravenous BID Terri Piedra, AGNP       ??? guaiFENesin (ROBITUSSIN) oral syrup  200 mg Oral Q4H PRN Orvilla Fus, MD       ??? influenza vaccine quad (FLUARIX, FLULAVAL, FLUZONE) (6 MOS & UP) 2020-21  0.5 mL Intramuscular During hospitalization Orvilla Fus, MD       ??? insulin glargine (LANTUS) injection 15 Units  15 Units Subcutaneous daily Verl Bangs, MD   15 Units at 06/20/19 0851   ??? insulin lispro (HumaLOG) injection 0-12 Units  0-12 Units Subcutaneous ACHS Orvilla Fus, MD   4 Units at 06/20/19 1235   ??? lactated Ringers infusion  10 mL/hr Intravenous Continuous Orvilla Fus, MD 10 mL/hr at 06/13/19 0847 10 mL/hr at 06/13/19 0847   ??? lactulose (CHRONULAC) oral solution (30 mL cup)  20 g Oral TID Pixie Casino, MD   20 g at 06/20/19 0850   ??? levothyroxine (SYNTHROID) tablet 25 mcg  25 mcg Oral daily Nelta Numbers Sasaki-Adams, MD   25 mcg at 06/20/19 0540   ??? magnesium oxide (MAG-OX) tablet 800 mg  800 mg Oral Daily Nelta Numbers Sasaki-Adams, MD   800 mg at 06/20/19 0850   ??? melatonin tablet 6 mg  6 mg Oral Nightly PRN Nelta Numbers Sasaki-Adams, MD       ??? ondansetron (ZOFRAN-ODT) disintegrating tablet 8 mg  8 mg Oral Q8H PRN Orvilla Fus, MD   8 mg at 06/19/19 0758    Or   ??? ondansetron (ZOFRAN) injection 4 mg  4 mg Intravenous Q8H PRN Orvilla Fus, MD   4 mg at 06/17/19 1317   ??? pantoprazole (PROTONIX) EC tablet 40 mg  40 mg Oral BID Orvilla Fus, MD   40 mg at 06/20/19 0850   ??? polysaccharide iron complex (NIFEREX) capsule 150 mg  150 mg Oral Daily Verl Bangs, MD   150 mg at 06/20/19 0850   ??? potassium chloride (KLOR-CON) CR tablet 20 mEq  20 mEq Oral Daily Orvilla Fus, MD   20 mEq at 06/20/19 0850   ??? prochlorperazine (COMPAZINE) 10 mg in sodium chloride (NS) 0.9 % 25 mL IVPB  10 mg Intravenous Q8H PRN Pixie Casino, MD       ??? rifAXIMin Burman Blacksmith) tablet 550 mg  550 mg Oral BID Orvilla Fus, MD   550 mg at 06/20/19 0850   ??? senna (SENOKOT) tablet 2 tablet  2 tablet Oral Nightly PRN Orvilla Fus, MD       ??? sertraline (ZOLOFT) tablet 50 mg  50 mg Oral Daily Nelta Numbers Sasaki-Adams, MD   50 mg at 06/20/19 0850   ??? sodium chloride (NS) 0.9 % infusion   Intravenous Continuous Verl Bangs, MD   500 mL at 06/16/19 1300   ??? spironolactone (ALDACTONE) tablet 100 mg  100 mg Oral Daily Pixie Casino, MD   100 mg at 06/20/19 0850   ???  sucralfate (CARAFATE) oral suspension  1 g Oral 4x Daily Orvilla Fus, MD   1 g at 06/20/19 1235       Vital Signs:  Temp:  [36.6 ??C-36.7 ??C] 36.7 ??C  Heart Rate:  [68] 68  Resp:  [18] 18  BP: (127-131)/(46-50) 131/46  SpO2:  [95 %-96 %] 96 %    Intake/Output last 3 shifts:  I/O last 3 completed shifts:  In: 520 [P.O.:520]  Out: 651 [Urine:650; Stool:1]    Physical Exam:  Constitutional: NAD  HEENT: no sleral icterus  CV: Regular  Pulmonary: normal WOB  Abdomen: soft, normal bowel sounds, non-distended, non-tender.  Skin: No jaundice   Extremities: warm, + LUE edema, + LE edema 2+  Neuro: Alert, Oriented x 3, No focal deficits. No asterixis    Diagnostic Studies:   Labs:  Recent Labs     06/18/19  0524 06/19/19  0510 06/20/19  0538   WBC 2.6* 2.6* 2.6*   HGB 8.5* 8.2* 7.9*   HCT 27.1* 26.9* 26.0*   PLT 44* 44* 43*     Recent Labs     06/18/19  0524 06/19/19  0510 06/20/19  0538   NA 136 138 136   K 3.5 3.6 3.8   CL 109* 110* 105   BUN 16 16 17    CREATININE 1.13* 0.93 1.01*   GLU 134 148 144*     Recent Labs     06/18/19  0524 06/19/19  0510 06/20/19  0538   BILITOT 1.9* 1.6* 1.6*     Recent Labs     06/18/19  0524 06/19/19  0510 06/20/19  0538   INR 1.53 1.62 1.69

## 2019-06-20 NOTE — Unmapped (Signed)
Pt A&O x 4. VS stable, afebrile. SpO2 WNL on RA. No complaints of any pain. Uses the BSC. Medications administered per MAR. No sliding scale needed at bedtime. Pt appropriately calls out when needing assistance. No falls or injuries. POC continues.            Problem: Adult Inpatient Plan of Care  Goal: Plan of Care Review  Outcome: Ongoing - Unchanged  Goal: Patient-Specific Goal (Individualization)  Outcome: Ongoing - Unchanged  Goal: Absence of Hospital-Acquired Illness or Injury  Outcome: Ongoing - Unchanged  Intervention: Prevent Skin Injury  Flowsheets (Taken 06/20/2019 0332)  Pressure Reduction Techniques: frequent weight shift encouraged  Intervention: Prevent Infection  Flowsheets (Taken 06/20/2019 0332)  Infection Prevention:  ??? handwashing promoted  ??? rest/sleep promoted  Goal: Optimal Comfort and Wellbeing  Outcome: Ongoing - Unchanged  Intervention: Provide Person-Centered Care  Flowsheets (Taken 06/20/2019 0332)  Trust Relationship/Rapport:  ??? care explained  ??? questions answered  Goal: Readiness for Transition of Care  Outcome: Ongoing - Unchanged  Goal: Rounds/Family Conference  Outcome: Ongoing - Unchanged     Problem: Fall Injury Risk  Goal: Absence of Fall and Fall-Related Injury  Outcome: Ongoing - Unchanged  Intervention: Promote Injury-Free Environment  Flowsheets (Taken 06/20/2019 [phone])  Safety Interventions:  ??? commode/urinal/bedpan at bedside  ??? fall reduction program maintained  ??? lighting adjusted for tasks/safety  ??? low bed  ??? nonskid shoes/slippers when out of bed  Environmental Safety Modification:  ??? assistive device/personal items within reach  ??? clutter free environment maintained  ??? lighting adjusted     Problem: Self-Care Deficit  Goal: Improved Ability to Complete Activities of Daily Living  Outcome: Ongoing - Unchanged  Intervention: Promote Activity and Functional Independence  Flowsheets (Taken 06/20/2019 0332)  Self-Care Promotion:  ??? independence encouraged  ??? BADL personal objects within reach  ??? BADL personal routines maintained     Problem: Diabetes Comorbidity  Goal: Blood Glucose Level Within Desired Range  Outcome: Ongoing - Unchanged  Intervention: Maintain Glycemic Control  Flowsheets (Taken 06/20/2019 0332)  Glycemic Management: blood glucose monitoring

## 2019-06-21 DIAGNOSIS — I491 Atrial premature depolarization: Secondary | ICD-10-CM

## 2019-06-21 DIAGNOSIS — E440 Moderate protein-calorie malnutrition: Secondary | ICD-10-CM

## 2019-06-21 DIAGNOSIS — F329 Major depressive disorder, single episode, unspecified: Secondary | ICD-10-CM

## 2019-06-21 DIAGNOSIS — K31819 Angiodysplasia of stomach and duodenum without bleeding: Secondary | ICD-10-CM

## 2019-06-21 DIAGNOSIS — K449 Diaphragmatic hernia without obstruction or gangrene: Secondary | ICD-10-CM

## 2019-06-21 DIAGNOSIS — K760 Fatty (change of) liver, not elsewhere classified: Secondary | ICD-10-CM

## 2019-06-21 DIAGNOSIS — K31811 Angiodysplasia of stomach and duodenum with bleeding: Secondary | ICD-10-CM

## 2019-06-21 DIAGNOSIS — Z6837 Body mass index (BMI) 37.0-37.9, adult: Secondary | ICD-10-CM

## 2019-06-21 DIAGNOSIS — D500 Iron deficiency anemia secondary to blood loss (chronic): Secondary | ICD-10-CM

## 2019-06-21 DIAGNOSIS — E7800 Pure hypercholesterolemia, unspecified: Secondary | ICD-10-CM

## 2019-06-21 DIAGNOSIS — Z9049 Acquired absence of other specified parts of digestive tract: Secondary | ICD-10-CM

## 2019-06-21 DIAGNOSIS — M797 Fibromyalgia: Secondary | ICD-10-CM

## 2019-06-21 DIAGNOSIS — E119 Type 2 diabetes mellitus without complications: Secondary | ICD-10-CM

## 2019-06-21 DIAGNOSIS — E8809 Other disorders of plasma-protein metabolism, not elsewhere classified: Secondary | ICD-10-CM

## 2019-06-21 DIAGNOSIS — E039 Hypothyroidism, unspecified: Secondary | ICD-10-CM

## 2019-06-21 DIAGNOSIS — I272 Pulmonary hypertension, unspecified: Secondary | ICD-10-CM

## 2019-06-21 DIAGNOSIS — I10 Essential (primary) hypertension: Secondary | ICD-10-CM

## 2019-06-21 DIAGNOSIS — Z794 Long term (current) use of insulin: Secondary | ICD-10-CM

## 2019-06-21 DIAGNOSIS — M810 Age-related osteoporosis without current pathological fracture: Secondary | ICD-10-CM

## 2019-06-21 DIAGNOSIS — K7460 Unspecified cirrhosis of liver: Secondary | ICD-10-CM

## 2019-06-21 LAB — BASIC METABOLIC PANEL
ANION GAP: 2 mmol/L — ABNORMAL LOW (ref 7–15)
BLOOD UREA NITROGEN: 20 mg/dL (ref 7–21)
BUN / CREAT RATIO: 21
CALCIUM: 8 mg/dL — ABNORMAL LOW (ref 8.5–10.2)
CHLORIDE: 105 mmol/L (ref 98–107)
CO2: 28 mmol/L (ref 22.0–30.0)
EGFR CKD-EPI AA FEMALE: 73 mL/min/{1.73_m2} (ref >=60)
EGFR CKD-EPI NON-AA FEMALE: 63 mL/min/{1.73_m2} (ref >=60)
GLUCOSE RANDOM: 174 mg/dL (ref 70–179)
POTASSIUM: 4 mmol/L (ref 3.5–5.0)
SODIUM: 135 mmol/L (ref 135–145)

## 2019-06-21 LAB — CBC
HEMATOCRIT: 24.8 % — ABNORMAL LOW (ref 36.0–46.0)
MEAN CORPUSCULAR HEMOGLOBIN CONC: 31 g/dL (ref 31.0–37.0)
MEAN CORPUSCULAR HEMOGLOBIN: 28.5 pg (ref 26.0–34.0)
MEAN CORPUSCULAR VOLUME: 92 fL (ref 80.0–100.0)
MEAN PLATELET VOLUME: 11.7 fL — ABNORMAL HIGH (ref 7.0–10.0)
PLATELET COUNT: 44 10*9/L — ABNORMAL LOW (ref 150–440)
RED BLOOD CELL COUNT: 2.69 10*12/L — ABNORMAL LOW (ref 4.00–5.20)
RED CELL DISTRIBUTION WIDTH: 18.5 % — ABNORMAL HIGH (ref 12.0–15.0)
WBC ADJUSTED: 2.1 10*9/L — ABNORMAL LOW (ref 4.5–11.0)

## 2019-06-21 LAB — MAGNESIUM: Magnesium:MCnc:Pt:Ser/Plas:Qn:: 1.8

## 2019-06-21 LAB — PROTIME-INR: INR: 1.68

## 2019-06-21 LAB — BILIRUBIN TOTAL: Bilirubin:MCnc:Pt:Ser/Plas:Qn:: 1.5 — ABNORMAL HIGH

## 2019-06-21 LAB — BLOOD UREA NITROGEN: Urea nitrogen:MCnc:Pt:Ser/Plas:Qn:: 20

## 2019-06-21 LAB — INR: Lab: 1.68

## 2019-06-21 LAB — MEAN CORPUSCULAR HEMOGLOBIN CONC: Lab: 31

## 2019-06-21 NOTE — Unmapped (Signed)
Pt alert and oriented, ambulates safely to commode, needs encouragement to ask for assistance. Pt reluctant to ask for care. Changed port, pt complains of some pain and tenderness only when deaccessing and accessing. Otherwise just general discomfort. Port flushes and has blood return with no problem. Pt edematous more on left side than right. Pale. Sad. Reports that she feels at a loss and that she really needs a liver. Pt is vigilant with assisting with I & O recording    Will continue with plan of care.     Problem: Adult Inpatient Plan of Care  Goal: Plan of Care Review  06/20/2019 1947 by Levester Fresh, RN  Outcome: Progressing  06/20/2019 1947 by Levester Fresh, RN  Outcome: Progressing  Goal: Patient-Specific Goal (Individualization)  06/20/2019 1947 by Levester Fresh, RN  Outcome: Progressing  06/20/2019 1947 by Levester Fresh, RN  Outcome: Progressing  Goal: Absence of Hospital-Acquired Illness or Injury  06/20/2019 1947 by Levester Fresh, RN  Outcome: Progressing  06/20/2019 1947 by Levester Fresh, RN  Outcome: Progressing  Goal: Optimal Comfort and Wellbeing  06/20/2019 1947 by Levester Fresh, RN  Outcome: Progressing  06/20/2019 1947 by Levester Fresh, RN  Outcome: Progressing  Goal: Readiness for Transition of Care  06/20/2019 1947 by Levester Fresh, RN  Outcome: Progressing  06/20/2019 1947 by Levester Fresh, RN  Outcome: Progressing  Goal: Rounds/Family Conference  06/20/2019 1947 by Levester Fresh, RN  Outcome: Progressing  06/20/2019 1947 by Levester Fresh, RN  Outcome: Progressing     Problem: Fall Injury Risk  Goal: Absence of Fall and Fall-Related Injury  06/20/2019 1947 by Levester Fresh, RN  Outcome: Progressing  06/20/2019 1947 by Levester Fresh, RN  Outcome: Progressing     Problem: Self-Care Deficit  Goal: Improved Ability to Complete Activities of Daily Living  06/20/2019 1947 by Levester Fresh, RN  Outcome: Progressing  06/20/2019 1947 by Levester Fresh, RN  Outcome: Progressing     Problem: Diabetes Comorbidity  Goal: Blood Glucose Level Within Desired Range  06/20/2019 1947 by Levester Fresh, RN  Outcome: Progressing  06/20/2019 1947 by Levester Fresh, RN  Outcome: Progressing

## 2019-06-21 NOTE — Unmapped (Signed)
Medicine Daily Progress Note    Assessment/Plan:  Principal Problem:    Upper GI bleeding  Active Problems:    NAFLD (nonalcoholic fatty liver disease)    Cirrhosis (CMS-HCC)    GAVE (gastric antral vascular ectasia)    Chronic blood loss anemia  Resolved Problems:    * No resolved hospital problems. *           Marilyn French is a 63 y.o. female who presented to [facility] with Upper GI bleeding.    Decompensated NASH cirrhosis c/b portal hypertension, ascites, history of hepatic encephalopathy: Hepatology consulted. Patient is s/p TIPS (06/2018). Currently with significant fluid overload.  TIPS ultrasound 9/30 concerning for possible intra-TIPS stenosis/ TIPS malfunction. Had received 2days of 40 Lasix??IV BID for volume overload, transitioned to torsemide 40mg  daily on 09/28 (home was 20mg  daily) without significant diuresis so resumed Lasix 40 mg IV BID on 9/29, again little progress so dose increased 9/30 to 60mg  BID.  Unable to get accurate I/Os. Weight down from 103.2 to 101.7 but now trending up again. No current signs of hepatic encephalopathy. Baseline weight is < 200 lbs. MELD 15 (10/2)  - increase Lasix to 80mg  IV BID with albumin support (per Hepatology)    - continue home spironolactone at increased dose (home dose 50 mg QD)  - Discussed with GI this AM and no TIPS intervention due to RV findings on TTE as this would worsen with TIPS revision; they think anasarca more nutritional/hypoalbumin related than TIPS dysfunction    - continue Lactulose, titrate to 3-4 BMs daily, continue rifaximin  - daily weight, attempt strict I/Os  - 2G Na diet , 2L fluid restriction    Chronic UGIB secondary to GAVE, acute on chronic iron deficiency and blood loss anemia: APC performed 9/25 for treatment of GAVE as well as 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 However she has had ongoing transfusion dependent anemia despite multiple sessions of APC. Has received 2 U PRBCs this admission (9/25,9/28). Ferritin < 20 consistent with iron deficiency anemia. Did not tolerate Feraheme infusion in the past. The reaction does not sound like anaphylaxis but occurred twice per patient.   - continue protonix PO 40 BID and carafate QID  - CBC daily   - Per hepatology no antibiotics for SBP prophylaxis needed due to chronic nature of bleed  - continue oral iron polysaccharide; also contains B12/folate so these must be measured as well as outpatient to ensure not achieving toxic levels  (at discharge in prescription enter: follow H/H, iron panel, folate, B12 levles before re-ordering each 30 day supply)  ??  Nausea: May be due to edema causing abdominal fullness vs lactulose  - Zofran and PRN compazine   - Monitor for improvement with diuresis   ??  Type 2 DM:  - continue Lantus 15U QAM due to ongoing nausea (home dose 45U)  - Lispro SSI  ??  Depression:   - continue home Zoloft    Hypothyroidism:  - continue home Synthroid     ___________________________________________________________________    Subjective:  No overnight events. Had TTE yesterday which was not significantly changed from prior in April 2019 (RV read as moderately dilated with normal function compared to mild). Weight remains stable despite Lasix 60mg  IV BID. Nausea controlled. Having ~3 BMs daily. No fevers or abdominal pain.      Labs/Studies:  Labs and Studies from the last 24hrs per EMR and Reviewed    Objective:  Temp:  [36.6 ??C (97.9 ??F)-36.7 ??C (  98.1 ??F)] 36.7 ??C (98.1 ??F)  Heart Rate:  [70-71] 70  Resp:  [18-20] 20  BP: (78-122)/(43-66) 78/66  SpO2:  [96 %-98 %] 96 %    GEN: Lying in bed, no acute distress  HEENT: sclera anicteric, moist mucous membranes  CV: RRR, no murmur  RESP: CTAB, normal WOB  ABD: soft, non-tender, non-distended,  BS normoactive  EXT: 2+ piting edema to BUE L>R swelling, 3+ BLE edema  NEURO: non-focal

## 2019-06-21 NOTE — Unmapped (Signed)
Patient AOX4, VSS throughout shift. All meds given on time as ordered.  Pt denied pain and SOB.  I's & O's tracked.  Pt used bedside commode in room.  Pt did have one large BM last night.  Remains free from falls.  Continues to maintain 2L fluid restriction.  SCDs on. Pt sleeping, will continue to monitor.      Problem: Adult Inpatient Plan of Care  Goal: Plan of Care Review  Outcome: Progressing  Goal: Patient-Specific Goal (Individualization)  Outcome: Progressing  Goal: Absence of Hospital-Acquired Illness or Injury  Outcome: Progressing  Goal: Optimal Comfort and Wellbeing  Outcome: Progressing  Goal: Readiness for Transition of Care  Outcome: Progressing  Goal: Rounds/Family Conference  Outcome: Progressing     Problem: Fall Injury Risk  Goal: Absence of Fall and Fall-Related Injury  Outcome: Progressing     Problem: Self-Care Deficit  Goal: Improved Ability to Complete Activities of Daily Living  Outcome: Progressing     Problem: Diabetes Comorbidity  Goal: Blood Glucose Level Within Desired Range  Outcome: Progressing

## 2019-06-21 NOTE — Unmapped (Signed)
HEPATOLOGY INPATIENT TREATMENT PLAN NOTE     Interval History:     - TTE done    Assessment:    70F w/hx of NAFLD cirrhosis (d/b ascites, HE, EV bleeding), s/p TIPS 06/2018??and embolization of mesocaval varices,??w/subsequent worsening HE, also c/b GAVE requiring multiple episodes APC, who presents as direct admission from endoscopy for planned AM capsule endoscopy in s/o chronic GIB.??Capsule showed clinically insignificant angio ectasia in the iluem. No evidence of further bleeding. Hepatology now also following diffuse anasarca/volume overload.   ??  For workup of volume overload in the setting of patient having a TIPS a [place] TIPS was done which showed findings concerning for intra-TIPS stenosis. Of note, the blood flow was hepatofugal through the TIPS and hepatopetal through the portal vein which also indicates possible TIPS malfunction. While this is a possible cause for her volume overload we would expect this to cause more issues with ascites instead of diffuse anasarca like she is presenting with. TTE done shows elevated RVSP, moderately dilated RV, severe dilation RA with normal RA pressure, normal LV function. Findings indicate that some of volume overload due to heart failure and intervention to option up TIPS would be high risk for complications. Given these findings do not recommend TIPS revision. Another likely contributing factor to her diffuse anasarca is simply worsening of her low albumin state (albumin 1.7, was high 2s several months ago). This is due to a combination of her liver disease and malnutrition. Her diffuse volume overload is also causes bowel wall edema which is contributing to her endoscopic findings of severe GAVA and chronic GI bleeding.   ??  RECOMMENDATIONS:  ??  #Anasarca  #LE edema  #Hypoalbuminemia  - no intervention/revision of TIPS planned given TTE results  - would continue aggressive diuresis, agree with lasix 80mg  IV BID, spironolactone 100mg  daily  - strict intake/output, follow creatinine closely  - continue albumin 25% 25g BID to assist with diuresis and for kidney protection  - encourage high protein intake given hypoalbuminemia, agree with nutritional supplements  ??  LUE > RUE edema:  - f/u upper extremities dopplers to evaluate for DVT  ??  GAVE:  - Carafate QID  - BID PPI  - Re-treatment of GAVE in 4 weeks (EGD Ordered)  ??  Outpatient hepatology follow up has been arranged.  Thank you for this consult. They patient was Discussed with  Dr. Piedad Climes. We will continue to follow along. Please page hepatology fellow on call with questions.     Hyman Bible, MD  Gastroenterology & Hepatology Fellow, PGY-4  [facility]

## 2019-06-22 LAB — BASIC METABOLIC PANEL
ANION GAP: 4 mmol/L — ABNORMAL LOW (ref 7–15)
BLOOD UREA NITROGEN: 21 mg/dL (ref 7–21)
BUN / CREAT RATIO: 23
CHLORIDE: 103 mmol/L (ref 98–107)
CREATININE: 0.9 mg/dL (ref 0.60–1.00)
EGFR CKD-EPI AA FEMALE: 78 mL/min/{1.73_m2} (ref >=60)
EGFR CKD-EPI NON-AA FEMALE: 68 mL/min/{1.73_m2} (ref >=60)
GLUCOSE RANDOM: 123 mg/dL (ref 70–179)
POTASSIUM: 3.7 mmol/L (ref 3.5–5.0)
SODIUM: 135 mmol/L (ref 135–145)

## 2019-06-22 LAB — CBC
HEMATOCRIT: 23.7 % — ABNORMAL LOW (ref 36.0–46.0)
HEMOGLOBIN: 7.2 g/dL — ABNORMAL LOW (ref 12.0–16.0)
MEAN CORPUSCULAR HEMOGLOBIN CONC: 30.2 g/dL — ABNORMAL LOW (ref 31.0–37.0)
MEAN CORPUSCULAR HEMOGLOBIN: 27.7 pg (ref 26.0–34.0)
MEAN PLATELET VOLUME: 12.8 fL — ABNORMAL HIGH (ref 7.0–10.0)
PLATELET COUNT: 39 10*9/L — ABNORMAL LOW (ref 150–440)
RED BLOOD CELL COUNT: 2.58 10*12/L — ABNORMAL LOW (ref 4.00–5.20)
WBC ADJUSTED: 1.9 10*9/L — ABNORMAL LOW (ref 4.5–11.0)

## 2019-06-22 LAB — MAGNESIUM: Magnesium:MCnc:Pt:Ser/Plas:Qn:: 1.9

## 2019-06-22 LAB — PROTIME: Coagulation tissue factor induced:Time:Pt:PPP:Qn:Coag: 19.8 — ABNORMAL HIGH

## 2019-06-22 LAB — ANION GAP: Anion gap 3:SCnc:Pt:Ser/Plas:Qn:: 4 — ABNORMAL LOW

## 2019-06-22 LAB — BILIRUBIN TOTAL: Bilirubin:MCnc:Pt:Ser/Plas:Qn:: 1.6 — ABNORMAL HIGH

## 2019-06-22 LAB — MEAN PLATELET VOLUME: Lab: 12.8 — ABNORMAL HIGH

## 2019-06-22 NOTE — Unmapped (Addendum)
Vital signs WDL on room air. Patient has had 2 bowel movements today after PO lactulose. Daughter visited with patient. Plan of care continues.   Problem: Adult Inpatient Plan of Care  Goal: Plan of Care Review  Outcome: Progressing  Goal: Patient-Specific Goal (Individualization)  Outcome: Progressing  Goal: Absence of Hospital-Acquired Illness or Injury  Outcome: Progressing  Goal: Optimal Comfort and Wellbeing  Outcome: Progressing  Goal: Readiness for Transition of Care  Outcome: Progressing  Goal: Rounds/Family Conference  Outcome: Progressing     Problem: Fall Injury Risk  Goal: Absence of Fall and Fall-Related Injury  Outcome: Progressing     Problem: Self-Care Deficit  Goal: Improved Ability to Complete Activities of Daily Living  Outcome: Progressing     Problem: Diabetes Comorbidity  Goal: Blood Glucose Level Within Desired Range  Outcome: Progressing

## 2019-06-22 NOTE — Unmapped (Signed)
Patient AOX4, VSS throughout shift. All meds given on time as ordered.  Pt denied pain and SOB.  I's & O's tracked.  Pt used bedside commode in room.  Albumin given last night, pt tolerated it well.  Remains free from falls.  Continues to maintain 2L fluid restriction.  SCDs on. Left arm still more swollen compared to right arm.  Pt sleeping, will continue to monitor.      Problem: Adult Inpatient Plan of Care  Goal: Plan of Care Review  Outcome: Progressing  Goal: Patient-Specific Goal (Individualization)  Outcome: Progressing  Goal: Absence of Hospital-Acquired Illness or Injury  Outcome: Progressing  Goal: Optimal Comfort and Wellbeing  Outcome: Progressing  Goal: Readiness for Transition of Care  Outcome: Progressing  Goal: Rounds/Family Conference  Outcome: Progressing     Problem: Fall Injury Risk  Goal: Absence of Fall and Fall-Related Injury  Outcome: Progressing     Problem: Self-Care Deficit  Goal: Improved Ability to Complete Activities of Daily Living  Outcome: Progressing     Problem: Diabetes Comorbidity  Goal: Blood Glucose Level Within Desired Range  Outcome: Progressing

## 2019-06-22 NOTE — Unmapped (Signed)
Medicine Daily Progress Note    Assessment/Plan:  Principal Problem:    Upper GI bleeding  Active Problems:    NAFLD (nonalcoholic fatty liver disease)    Cirrhosis (CMS-HCC)    GAVE (gastric antral vascular ectasia)    Chronic blood loss anemia  Resolved Problems:    * No resolved hospital problems. *           Marilyn French is a 63 y.o. female who presented to [facility] with Upper GI bleeding.    Decompensated NASH cirrhosis c/b portal hypertension, ascites, history of hepatic encephalopathy: Hepatology consulted. Patient is s/p TIPS (06/2018). Currently with significant fluid overload.  TIPS ultrasound 9/30 concerning for possible intra-TIPS stenosis/ TIPS malfunction. Had received 2days of 40 Lasix??IV BID for volume overload, transitioned to torsemide 40mg  daily on 09/28 (home was 20mg  daily) without significant diuresis so resumed Lasix 40 mg IV BID on 9/29, again little progress so dose increased 9/30 to 60mg  BID.  Unable to get accurate I/Os. Weight down from 103.2 to 101.7 but then trended up again. No current signs of hepatic encephalopathy. Baseline weight is < 200 lbs. MELD 14   - continue Lasix 80mg  IV BID with albumin support (per Hepatology)    - continue home spironolactone at increased dose (home dose 50 mg QD)  - Discussed with GI on 10/03 and no TIPS intervention due to RV findings on TTE as this would worsen with TIPS revision; they think anasarca more nutritional/hypoalbuminia related than TIPS dysfunction    - continue Lactulose, titrate to 3-4 BMs daily, continue rifaximin  - daily weight, attempt strict I/Os  - 2G Na diet , 2L fluid restriction    Chronic UGIB secondary to GAVE, acute on chronic iron deficiency and blood loss anemia: APC performed 9/25 for treatment of GAVE as well as 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 However she has had ongoing transfusion dependent anemia despite multiple sessions of APC. Has received 2 U PRBCs this admission (9/25,9/28). Ferritin < 20 consistent with iron deficiency anemia. Did not tolerate Feraheme infusion in the past. The reaction does not sound like anaphylaxis but occurred twice per patient.   - continue protonix PO 40 BID and carafate QID  - CBC daily   - Per hepatology no antibiotics for SBP prophylaxis needed due to chronic nature of bleed  - continue oral iron polysaccharide; also contains B12/folate so these must be measured as well as outpatient to ensure not achieving toxic levels  (at discharge in prescription enter: follow H/H, iron panel, folate, B12 levles before re-ordering each 30 day supply)  ??  Nausea: May be due to edema causing abdominal fullness vs lactulose  - Zofran and PRN compazine   - Monitor for improvement with diuresis   ??  Type 2 DM:  - continue Lantus 15U QAM due to ongoing nausea (home dose 45U)  - Lispro SSI  ??  Depression:   - continue home Zoloft    Hypothyroidism:  - continue home Synthroid     ___________________________________________________________________    Subjective:  No overnight events. Made nearly 2L UOP in the past 24 hours. Weight down to 221 this AM per patient. LUE PVL showed what appears to be a chronic vein stenosis as etiology for asymmetric edema of LUE. Had a good BM this morning. No bleeding. No fever, abdominal pain, jaundice, dizziness. Nausea controlled. Having ~3 BMs daily.       Labs/Studies:  Labs and Studies from the last 24hrs per EMR and Reviewed  MELD-Na score: 16 at 06/22/2019  5:13 AM  MELD score: 14 at 06/22/2019  5:13 AM  Calculated from:  Serum Creatinine: 0.90 mg/dL (Rounded to 1 mg/dL) at 16/09/958  4:54 AM  Serum Sodium: 135 mmol/L at 06/22/2019  5:13 AM  Total Bilirubin: 1.6 mg/dL at 05/26/1190  4:78 AM  INR(ratio): 1.70 at 06/22/2019  5:13 AM  Age: 5 years 1 month      Objective:  Temp:  [36.5 ??C (97.7 ??F)-36.7 ??C (98.1 ??F)] 36.7 ??C (98.1 ??F)  Heart Rate:  [69-72] 72  Resp:  [18-26] 22  BP: (115-123)/(32-50) 121/44  SpO2:  [95 %-98 %] 95 %    GEN: Lying in bed, no acute distress  HEENT: sclera anicteric, moist mucous membranes  CV: RRR, no murmur  RESP: CTAB, normal WOB  ABD: soft, non-tender, non-distended,  BS normoactive  EXT: 2+ pitting edema to BUE L>R swelling, 3+ BLE edema  NEURO: non-focal

## 2019-06-22 NOTE — Unmapped (Signed)
Pt has no CP distress, on RA. Denies pain or discomfort. Pt consumes all meals serve. FS covered accordingly. Left arm and LLE  remain more swollen than right,  no pain, PVL done today. IV Albumin given via porth. Pt able to get up out of bed to bed side commode slowly but independently. I/O monitored. Updated with POC.   Problem: Adult Inpatient Plan of Care  Goal: Plan of Care Review  Outcome: Progressing  Goal: Patient-Specific Goal (Individualization)  Outcome: Progressing  Flowsheets (Taken 06/21/2019 1659)  Patient-Specific Goals (Include Timeframe): Pt will be able to get up safely from bed to bedside commode during hospitalization 06/23/19.  Goal: Absence of Hospital-Acquired Illness or Injury  Outcome: Progressing  Goal: Optimal Comfort and Wellbeing  Outcome: Progressing  Goal: Readiness for Transition of Care  Outcome: Progressing  Goal: Rounds/Family Conference  Outcome: Progressing     Problem: Fall Injury Risk  Goal: Absence of Fall and Fall-Related Injury  Outcome: Progressing     Problem: Self-Care Deficit  Goal: Improved Ability to Complete Activities of Daily Living  Outcome: Progressing     Problem: Diabetes Comorbidity  Goal: Blood Glucose Level Within Desired Range  Outcome: Progressing

## 2019-06-23 ENCOUNTER — Encounter (INDEPENDENT_AMBULATORY_CARE_PROVIDER_SITE_OTHER): Payer: Self-pay

## 2019-06-23 LAB — CBC
HEMATOCRIT: 23.5 % — ABNORMAL LOW (ref 36.0–46.0)
HEMOGLOBIN: 7.2 g/dL — ABNORMAL LOW (ref 12.0–16.0)
MEAN CORPUSCULAR HEMOGLOBIN CONC: 30.7 g/dL — ABNORMAL LOW (ref 31.0–37.0)
MEAN CORPUSCULAR HEMOGLOBIN: 27.9 pg (ref 26.0–34.0)
MEAN CORPUSCULAR VOLUME: 91.1 fL (ref 80.0–100.0)
MEAN PLATELET VOLUME: 12.8 fL — ABNORMAL HIGH (ref 7.0–10.0)
PLATELET COUNT: 49 10*9/L — ABNORMAL LOW (ref 150–440)
RED BLOOD CELL COUNT: 2.57 10*12/L — ABNORMAL LOW (ref 4.00–5.20)
RED CELL DISTRIBUTION WIDTH: 18.1 % — ABNORMAL HIGH (ref 12.0–15.0)

## 2019-06-23 LAB — MEAN PLATELET VOLUME: Lab: 12.8 — ABNORMAL HIGH

## 2019-06-23 LAB — BASIC METABOLIC PANEL
ANION GAP: 5 mmol/L — ABNORMAL LOW (ref 7–15)
BLOOD UREA NITROGEN: 23 mg/dL — ABNORMAL HIGH (ref 7–21)
CALCIUM: 8.3 mg/dL — ABNORMAL LOW (ref 8.5–10.2)
CHLORIDE: 103 mmol/L (ref 98–107)
CO2: 27 mmol/L (ref 22.0–30.0)
CREATININE: 0.97 mg/dL (ref 0.60–1.00)
EGFR CKD-EPI AA FEMALE: 71 mL/min/{1.73_m2} (ref >=60)
EGFR CKD-EPI NON-AA FEMALE: 62 mL/min/{1.73_m2} (ref >=60)
GLUCOSE RANDOM: 132 mg/dL (ref 70–179)
POTASSIUM: 3.9 mmol/L (ref 3.5–5.0)
SODIUM: 135 mmol/L (ref 135–145)

## 2019-06-23 LAB — BILIRUBIN TOTAL: Bilirubin:MCnc:Pt:Ser/Plas:Qn:: 1.7 — ABNORMAL HIGH

## 2019-06-23 LAB — MAGNESIUM: Magnesium:MCnc:Pt:Ser/Plas:Qn:: 1.9

## 2019-06-23 LAB — ANION GAP: Anion gap 3:SCnc:Pt:Ser/Plas:Qn:: 5 — ABNORMAL LOW

## 2019-06-23 LAB — INR: Lab: 1.84

## 2019-06-23 NOTE — Unmapped (Signed)
Patient AOX4, VSS throughout shift with soft BP.  Pt remains asymptomatic. All meds given on time as ordered.  Pt denied pain and SOB.  I's & O's tracked.  Pt used bedside commode in room.  Albumin given last night, pt tolerated it well.  Remains free from falls.  Continues to maintain 2L fluid restriction.  SCDs on. Left arm still more swollen compared to right arm.  One large BM this shift.  Pt sleeping, will continue to monitor.      Problem: Adult Inpatient Plan of Care  Goal: Plan of Care Review  Outcome: Progressing  Goal: Patient-Specific Goal (Individualization)  Outcome: Progressing  Goal: Absence of Hospital-Acquired Illness or Injury  Outcome: Progressing  Goal: Optimal Comfort and Wellbeing  Outcome: Progressing  Goal: Readiness for Transition of Care  Outcome: Progressing  Goal: Rounds/Family Conference  Outcome: Progressing     Problem: Fall Injury Risk  Goal: Absence of Fall and Fall-Related Injury  Outcome: Progressing     Problem: Self-Care Deficit  Goal: Improved Ability to Complete Activities of Daily Living  Outcome: Progressing     Problem: Diabetes Comorbidity  Goal: Blood Glucose Level Within Desired Range  Outcome: Progressing

## 2019-06-23 NOTE — Unmapped (Signed)
Daily Progress Note    Assessment/Plan:    Principal Problem:    Upper GI bleeding  Active Problems:    NAFLD (nonalcoholic fatty liver disease)    Cirrhosis (CMS-HCC)    GAVE (gastric antral vascular ectasia)    Chronic blood loss anemia  Resolved Problems:    * No resolved hospital problems. *                 Marilyn French is a 63 y.o. female who presented to [facility] with Upper GI bleeding.  ??  Decompensated NASH cirrhosis c/b portal hypertension, ascites, history of hepatic encephalopathy: Hepatology consulted. Patient is s/p TIPS (06/2018). Currently with significant fluid overload.  TIPS ultrasound 9/30 concerning for possible intra-TIPS stenosis/ TIPS malfunction. Had received 2days of 40 Lasix??IV BID for volume overload, transitioned to torsemide 40mg  daily on 09/28 (home was 20mg  daily) without significant diuresis so resumed Lasix 40 mg IV BID on 9/29, again little progress so dose increased 9/30 to 60mg  BID then 80 bid on 10/4 - 5. Still did not notice significant improvement clinically. Patient also reported minimum to no improvement with swelling. Discussed with GI Hepatology, recommended to switch to Bumex 2 mg BID in am and continue to monitor.   Unable to get accurate I/Os.  No current signs of hepatic encephalopathy. Baseline weight is < 200 lbs. MELD- Na 17  Wt Readings from Last 6 Encounters:   06/23/19 (!) 101.6 kg (224 lb)   05/23/19 92.1 kg (203 lb)   04/04/19 83.9 kg (185 lb)   03/24/19 83.9 kg (185 lb)   02/28/19 93 kg (205 lb)   11/14/18 88.9 kg (196 lb)     MELD-Na score: 17 at 06/23/2019  6:22 AM  MELD score: 15 at 06/23/2019  6:22 AM  Calculated from:  Serum Creatinine: 0.97 mg/dL (Rounded to 1 mg/dL) at 16/09/958  4:54 AM  Serum Sodium: 135 mmol/L at 06/23/2019  6:22 AM  Total Bilirubin: 1.7 mg/dL at 05/26/1190  4:78 AM  INR(ratio): 1.84 at 06/23/2019  6:22 AM  Age: 19 years 1 month  - Lasix 80mg  IV BID today  - Continue albumin 25 gm BID  - Switch to Bumex 2 mg IV bid in am 10/6  - Follow up liver enzymes and consider stopping albumin based on labs (per Hepatology)    - continue home spironolactone at increased dose (home dose 50 mg QD)  - Discussed with GI on 10/03 and no TIPS intervention due to RV findings on TTE as this would worsen with TIPS revision; they think anasarca more nutritional/hypoalbuminia related than TIPS dysfunction    - continue Lactulose, titrate to 3-4 BMs daily, continue rifaximin  - daily weight, attempt strict I/Os  - 2G Na diet   - Discontinued  2L fluid restriction  - follow up cbc, cmp in am  ??  Chronic UGIB secondary to GAVE, acute on chronic iron deficiency and blood loss anemia: APC performed 9/25 for treatment of GAVE as well as 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 However she has had ongoing transfusion dependent anemia despite multiple sessions of APC. Has received 2 U PRBCs this admission (9/25,9/28). Ferritin <??20 consistent with iron deficiency anemia. Did not tolerate Feraheme infusion in the past. The reaction does not sound like anaphylaxis but occurred twice per patient.   - Continue protonix PO 40 BID and carafate QID  - CBC daily   - Per hepatology no antibiotics for SBP prophylaxis needed due to chronic nature of bleed  -  Continue oral iron??polysaccharide;??also contains B12/folate so these must be measured as well as outpatient to ensure not achieving toxic levels (at discharge in prescription enter: follow H/H, iron panel, folate, B12 levles before re-ordering each 30 day supply)  ??  Nausea: Improving. Likely related due to edema causing abdominal fullness vs lactulose  - Zofran and PRN compazine   - Monitor for improvement with diuresis   ??  Type 2 DM:  -??continue??Lantus 15U QAM due to ongoing nausea (home dose 45U)  - Lispro SSI  ??  Depression:   - continue home Zoloft  ??  Hypothyroidism:  - continue home Synthroid   ___________________________________________________________________    Subjective:  No acute events overnight, Patient states pain well controlled, Tolerating diet without difficulty. Feels improvement with nausea, very little improvement with SOB not significant. Encouraged to elevate LE and hoping SOB will improve with diuresis.     Recent Results (from the past 24 hour(s))   POCT Glucose    Collection Time: 06/22/19  6:30 PM   Result Value Ref Range    Glucose, POC 177 70 - 179 mg/dL   POCT Glucose    Collection Time: 06/22/19 10:45 PM   Result Value Ref Range    Glucose, POC 194 (H) 70 - 179 mg/dL   Magnesium Level    Collection Time: 06/23/19  6:22 AM   Result Value Ref Range    Magnesium 1.9 1.6 - 2.2 mg/dL   Basic Metabolic Panel    Collection Time: 06/23/19  6:22 AM   Result Value Ref Range    Sodium 135 135 - 145 mmol/L    Potassium 3.9 3.5 - 5.0 mmol/L    Chloride 103 98 - 107 mmol/L    CO2 27.0 22.0 - 30.0 mmol/L    Anion Gap 5 (L) 7 - 15 mmol/L    BUN 23 (H) 7 - 21 mg/dL    Creatinine 1.61 0.96 - 1.00 mg/dL    BUN/Creatinine Ratio 24     EGFR CKD-EPI Non-African American, Female 62 >=60 mL/min/1.68m2    EGFR CKD-EPI African American, Female 31 >=60 mL/min/1.29m2    Glucose 132 70 - 179 mg/dL    Calcium 8.3 (L) 8.5 - 10.2 mg/dL   Bilirubin, total    Collection Time: 06/23/19  6:22 AM   Result Value Ref Range    Total Bilirubin 1.7 (H) 0.0 - 1.2 mg/dL   CBC    Collection Time: 06/23/19  6:22 AM   Result Value Ref Range    WBC 2.0 (L) 4.5 - 11.0 10*9/L    RBC 2.57 (L) 4.00 - 5.20 10*12/L    HGB 7.2 (L) 12.0 - 16.0 g/dL    HCT 04.5 (L) 40.9 - 46.0 %    MCV 91.1 80.0 - 100.0 fL    MCH 27.9 26.0 - 34.0 pg    MCHC 30.7 (L) 31.0 - 37.0 g/dL    RDW 81.1 (H) 91.4 - 15.0 %    MPV 12.8 (H) 7.0 - 10.0 fL    Platelet 49 (L) 150 - 440 10*9/L   PT-INR    Collection Time: 06/23/19  6:22 AM   Result Value Ref Range    PT 21.4 (H) 10.2 - 13.1 sec    INR 1.84    POCT Glucose    Collection Time: 06/23/19  7:43 AM   Result Value Ref Range    Glucose, POC 137 70 - 179 mg/dL   POCT Glucose    Collection Time:  06/23/19 12:40 PM   Result Value Ref Range Glucose, POC 215 (H) 70 - 179 mg/dL     Labs/Studies:  Labs and Studies from the last 24hrs per EMR and Reviewed and   All lab results last 24 hours:    Recent Results (from the past 24 hour(s))   POCT Glucose    Collection Time: 06/22/19  6:30 PM   Result Value Ref Range    Glucose, POC 177 70 - 179 mg/dL   POCT Glucose    Collection Time: 06/22/19 10:45 PM   Result Value Ref Range    Glucose, POC 194 (H) 70 - 179 mg/dL   Magnesium Level    Collection Time: 06/23/19  6:22 AM   Result Value Ref Range    Magnesium 1.9 1.6 - 2.2 mg/dL   Basic Metabolic Panel    Collection Time: 06/23/19  6:22 AM   Result Value Ref Range    Sodium 135 135 - 145 mmol/L    Potassium 3.9 3.5 - 5.0 mmol/L    Chloride 103 98 - 107 mmol/L    CO2 27.0 22.0 - 30.0 mmol/L    Anion Gap 5 (L) 7 - 15 mmol/L    BUN 23 (H) 7 - 21 mg/dL    Creatinine 1.61 0.96 - 1.00 mg/dL    BUN/Creatinine Ratio 24     EGFR CKD-EPI Non-African American, Female 62 >=60 mL/min/1.57m2    EGFR CKD-EPI African American, Female 70 >=60 mL/min/1.35m2    Glucose 132 70 - 179 mg/dL    Calcium 8.3 (L) 8.5 - 10.2 mg/dL   Bilirubin, total    Collection Time: 06/23/19  6:22 AM   Result Value Ref Range    Total Bilirubin 1.7 (H) 0.0 - 1.2 mg/dL   CBC    Collection Time: 06/23/19  6:22 AM   Result Value Ref Range    WBC 2.0 (L) 4.5 - 11.0 10*9/L    RBC 2.57 (L) 4.00 - 5.20 10*12/L    HGB 7.2 (L) 12.0 - 16.0 g/dL    HCT 04.5 (L) 40.9 - 46.0 %    MCV 91.1 80.0 - 100.0 fL    MCH 27.9 26.0 - 34.0 pg    MCHC 30.7 (L) 31.0 - 37.0 g/dL    RDW 81.1 (H) 91.4 - 15.0 %    MPV 12.8 (H) 7.0 - 10.0 fL    Platelet 49 (L) 150 - 440 10*9/L   PT-INR    Collection Time: 06/23/19  6:22 AM   Result Value Ref Range    PT 21.4 (H) 10.2 - 13.1 sec    INR 1.84    POCT Glucose    Collection Time: 06/23/19  7:43 AM   Result Value Ref Range    Glucose, POC 137 70 - 179 mg/dL   POCT Glucose    Collection Time: 06/23/19 12:40 PM   Result Value Ref Range    Glucose, POC 215 (H) 70 - 179 mg/dL       Objective: Temp:  [36.6 ??C (97.9 ??F)] 36.6 ??C (97.9 ??F)  Heart Rate:  [72-73] 72  Resp:  [20-24] 20  BP: (94-132)/(48-55) 110/48  SpO2:  [94 %-98 %] 98 %    GEN: ill looking, obese female, Lying in bed, no acute distress  HEENT: sclera anicteric, moist mucous membranes  CV: RRR, no murmur  RESP: CTAB, normal WOB  ABD: soft, non-tender, non-distended,  BS normoactive  EXT: 2+ pitting edema to BUE L>R swelling, 3+  BLE edema  NEURO: non-focal

## 2019-06-23 NOTE — Unmapped (Signed)
HEPATOLOGY INPATIENT TREATMENT PLAN NOTE     Interval History:     - weight stable, 1500 UOP yesterday    Assessment:    29F w/hx of NAFLD cirrhosis (d/b ascites, HE, EV bleeding), s/p TIPS 06/2018??and embolization of mesocaval varices,??w/subsequent worsening HE, also c/b GAVE requiring multiple episodes APC, who presents as direct admission from endoscopy for planned AM capsule endoscopy in s/o chronic GIB.??Capsule showed clinically insignificant angio ectasia in the iluem. No evidence of further bleeding. Hepatology now also following diffuse anasarca/volume overload.   ??  For workup of volume overload in the setting of patient having a TIPS a [place] TIPS was done which showed findings concerning for intra-TIPS stenosis. Of note, the blood flow was hepatofugal through the TIPS and hepatopetal through the portal vein which also indicates possible TIPS malfunction. While this is a possible cause for her volume overload we would expect this to cause more issues with ascites instead of diffuse anasarca like she is presenting with. TTE done shows elevated RVSP, moderately dilated RV, severe dilation RA with normal RA pressure, normal LV function. Findings indicate that some of volume overload due to heart failure and intervention to option up TIPS would be high risk for complications. Given these findings do not recommend TIPS revision. Another likely contributing factor to her diffuse anasarca is simply worsening of her low albumin state (albumin 1.7, was high 2s several months ago).??This is due to a combination of her liver disease and malnutrition. Her diffuse volume overload is also causes bowel wall edema which is contributing to her endoscopic findings of severe GAVA and chronic GI bleeding.   ??  RECOMMENDATIONS:  ??  #Anasarca  #LE edema  #Hypoalbuminemia  - would start bumex 2mg  IV BID and continue spironolactone 100mg  daily  - continue albumin 25% 25g BID to assist with diuresis and for kidney protection (would potentially stop this based on her serum albumin level, please send this)  - strict intake/output, daily weights, follow creatinine closely  - continue 2g Na diet, does not need a fluid restriction  - check liver enzymes daily  - encourage high protein intake given hypoalbuminemia, agree with nutritional supplements  ??  GAVE:  - Carafate QID  - BID PPI  - Re-treatment of GAVE in 4 weeks (EGD Ordered)  ??  Outpatient hepatology follow up has been arranged.    Thank you for this consult. They patient was Discussed with  Dr. Sherryll Burger. We will continue to follow along. Please page hepatology fellow on call with questions.     Hyman Bible, MD  Gastroenterology & Hepatology Fellow, PGY-4  [place] [facility] [place]

## 2019-06-24 ENCOUNTER — Ambulatory Visit (INDEPENDENT_AMBULATORY_CARE_PROVIDER_SITE_OTHER): Payer: PPO | Admitting: Internal Medicine

## 2019-06-24 LAB — CBC
HEMATOCRIT: 21.9 % — ABNORMAL LOW (ref 36.0–46.0)
HEMATOCRIT: 24.6 % — ABNORMAL LOW (ref 36.0–46.0)
HEMOGLOBIN: 6.6 g/dL — ABNORMAL LOW (ref 12.0–16.0)
HEMOGLOBIN: 7.7 g/dL — ABNORMAL LOW (ref 12.0–16.0)
MEAN CORPUSCULAR HEMOGLOBIN CONC: 30.4 g/dL — ABNORMAL LOW (ref 31.0–37.0)
MEAN CORPUSCULAR HEMOGLOBIN CONC: 31.2 g/dL (ref 31.0–37.0)
MEAN CORPUSCULAR HEMOGLOBIN: 27.6 pg (ref 26.0–34.0)
MEAN CORPUSCULAR HEMOGLOBIN: 28 pg (ref 26.0–34.0)
MEAN CORPUSCULAR VOLUME: 90.8 fL (ref 80.0–100.0)
MEAN PLATELET VOLUME: 12.8 fL — ABNORMAL HIGH (ref 7.0–10.0)
MEAN PLATELET VOLUME: 12.9 fL — ABNORMAL HIGH (ref 7.0–10.0)
PLATELET COUNT: 40 10*9/L — ABNORMAL LOW (ref 150–440)
PLATELET COUNT: 41 10*9/L — ABNORMAL LOW (ref 150–440)
RED BLOOD CELL COUNT: 2.75 10*12/L — ABNORMAL LOW (ref 4.00–5.20)
RED CELL DISTRIBUTION WIDTH: 17.9 % — ABNORMAL HIGH (ref 12.0–15.0)
RED CELL DISTRIBUTION WIDTH: 18 % — ABNORMAL HIGH (ref 12.0–15.0)
WBC ADJUSTED: 2.5 10*9/L — ABNORMAL LOW (ref 4.5–11.0)

## 2019-06-24 LAB — SODIUM: Sodium:SCnc:Pt:Ser/Plas:Qn:: 134 — ABNORMAL LOW

## 2019-06-24 LAB — COMPREHENSIVE METABOLIC PANEL
ALBUMIN: 2.9 g/dL — ABNORMAL LOW (ref 3.5–5.0)
ALKALINE PHOSPHATASE: 57 U/L (ref 38–126)
ALT (SGPT): 14 U/L (ref <35)
ANION GAP: 6 mmol/L — ABNORMAL LOW (ref 7–15)
AST (SGOT): 24 U/L (ref 14–38)
BILIRUBIN TOTAL: 1.8 mg/dL — ABNORMAL HIGH (ref 0.0–1.2)
BLOOD UREA NITROGEN: 22 mg/dL — ABNORMAL HIGH (ref 7–21)
BUN / CREAT RATIO: 23
CALCIUM: 8.4 mg/dL — ABNORMAL LOW (ref 8.5–10.2)
CHLORIDE: 103 mmol/L (ref 98–107)
CO2: 25 mmol/L (ref 22.0–30.0)
CREATININE: 0.97 mg/dL (ref 0.60–1.00)
EGFR CKD-EPI AA FEMALE: 71 mL/min/{1.73_m2} (ref >=60)
EGFR CKD-EPI NON-AA FEMALE: 62 mL/min/{1.73_m2} (ref >=60)
POTASSIUM: 3.6 mmol/L (ref 3.5–5.0)
PROTEIN TOTAL: 4.9 g/dL — ABNORMAL LOW (ref 6.5–8.3)
SODIUM: 134 mmol/L — ABNORMAL LOW (ref 135–145)

## 2019-06-24 LAB — RED BLOOD CELL COUNT: Lab: 2.75 — ABNORMAL LOW

## 2019-06-24 LAB — MAGNESIUM
MAGNESIUM: 2 mg/dL (ref 1.6–2.2)
Magnesium:MCnc:Pt:Ser/Plas:Qn:: 2

## 2019-06-24 LAB — HEMATOCRIT: Hematocrit:VFr:Pt:Bld:Qn:: 21.9 — ABNORMAL LOW

## 2019-06-24 LAB — HEMOGLOBIN: Hemoglobin:MCnc:Pt:Bld:Qn:: 6.6 — ABNORMAL LOW

## 2019-06-24 LAB — PROTIME: Coagulation tissue factor induced:Time:Pt:PPP:Qn:Coag: 21.9 — ABNORMAL HIGH

## 2019-06-24 LAB — PROTIME-INR: PROTIME: 21.9 s — ABNORMAL HIGH (ref 10.2–13.1)

## 2019-06-24 NOTE — Unmapped (Signed)
Patient alert and oriented. Patient received medications per [facility]. PAtient denied pain. Patient received IV albumin and tolerated it well. Patient calling out appropriately for assistance getting OOB. Will continue POC.   Problem: Adult Inpatient Plan of Care  Goal: Plan of Care Review  Outcome: Progressing  Flowsheets (Taken 06/23/2019 1948)  Progress: improving  Plan of Care Reviewed With: patient  Goal: Patient-Specific Goal (Individualization)  Outcome: Progressing  Flowsheets (Taken 06/23/2019 1948)  Patient-Specific Goals (Include Timeframe): Patient will ambulate in the hall with standby assistance by 10/7.  Goal: Absence of Hospital-Acquired Illness or Injury  Outcome: Progressing  Intervention: Identify and Manage Fall Risk  Flowsheets (Taken 06/23/2019 1948)  Safety Interventions:   fall reduction program maintained   room near unit station   lighting adjusted for tasks/safety  Intervention: Prevent Skin Injury  Flowsheets (Taken 06/23/2019 1948)  Pressure Reduction Techniques: frequent weight shift encouraged  Intervention: Prevent VTE (venous thromboembolism)  Flowsheets (Taken 06/23/2019 1948)  VTE Prevention/Management: bleeding precautions maintained  Intervention: Prevent Infection  Flowsheets (Taken 06/23/2019 1948)  Infection Prevention:   handwashing promoted   rest/sleep promoted

## 2019-06-24 NOTE — Unmapped (Addendum)
Informed Blood Consent    I have advised Marilyn French of her medical condition, and that the chances for her improvement or recovery will be significantly helped by receiving blood products by transfusion:  Such as packed red blood cells, fresh frozen plasma, platelets or cryoprecipitate (blood products).    I have explained the benefits that are expected from the patient being transfused and, as well, the risk.  The patient understands that although the blood products to be administered had been prepared and tested in accordance with strict scientific rules established by the [employer], there is still a very small - approximately 1 and 70,000 for Hemolytic Reactions and approximately 1 and 190,000 for TRALI - chance of blood products could be incompatible with the patient's body and a transfusion reaction can occur.  Although transfusion reactions can be treated successfully, the patient understands that on rare occasions they can be fatal (1 in 250,000 transfusions).  The patient also understands that allergic reactions to blood products with hives, itching, and fever are more common but can be treated and may not even require the transfusion to be stopped.  The patient understands that even with testing by the most up-to-date methods, there is a small chance that the blood products may contain a virus that may not be recognized as an infection for many months or years.  Even with proper testing, I discussed with the patient that the chance for contracting viral Hepatitis B is approximately 1 in 200,000, Hepatitis C is approximately 1 in 2 million, and HIV is approximately 1 in 2 million.    I gave the patient an opportunity to ask questions regarding the transfusion of blood products and I answered those questions to the patient's satisfaction.    ______________________________________________________________________  Instructions for Nursing:  Nurse will indicate at the top of the consent form (page 1) the portions in BOLD:  1.  I authorize FNP. Twanda Stakes and/or associates and Engineer, materials of his/her choice at [place] [facility] (referred to herein as 'facility') to perform the following procedure(s): Transfusion of Blood Products.    The patient should place her initials in the area under #4 indicating Authorization of blood products.    The patient's signature (page 2) will be obtained by nursing staff on the consent form and the nurse will verify this consent under the Witness Certification (page 2).  The paper form will subsequently be placed in the patient's physical chart (later to be scanned) as further evidence that the patient has given consent to the administration of blood products.

## 2019-06-24 NOTE — Unmapped (Signed)
Patient remains on RA. Denies pain. No bleeding noted. Up to bedside commode with assistance. Will continue POC         Problem: Adult Inpatient Plan of Care  Goal: Plan of Care Review  Outcome: Ongoing - Unchanged  Goal: Patient-Specific Goal (Individualization)  Outcome: Ongoing - Unchanged  Goal: Absence of Hospital-Acquired Illness or Injury  Outcome: Ongoing - Unchanged  Goal: Optimal Comfort and Wellbeing  Outcome: Ongoing - Unchanged  Goal: Readiness for Transition of Care  Outcome: Ongoing - Unchanged  Goal: Rounds/Family Conference  Outcome: Ongoing - Unchanged     Problem: Fall Injury Risk  Goal: Absence of Fall and Fall-Related Injury  Outcome: Ongoing - Unchanged     Problem: Self-Care Deficit  Goal: Improved Ability to Complete Activities of Daily Living  Outcome: Ongoing - Unchanged     Problem: Diabetes Comorbidity  Goal: Blood Glucose Level Within Desired Range  Outcome: Ongoing - Unchanged

## 2019-06-24 NOTE — Unmapped (Signed)
Daily Progress Note    Assessment/Plan:    Principal Problem:    Upper GI bleeding  Active Problems:    NAFLD (nonalcoholic fatty liver disease)    Cirrhosis (CMS-HCC)    GAVE (gastric antral vascular ectasia)    Chronic blood loss anemia  Resolved Problems:    * No resolved hospital problems. Marilyn French??is a 63 y.o.??female??who presented to [facility] with Upper GI bleeding.  ??  Decompensated NASH cirrhosis c/b portal hypertension, ascites, history of hepatic encephalopathy: Hepatology consulted. Patient is s/p TIPS (06/2018). Currently with significant fluid overload. ??TIPS ultrasound 9/30 concerning for possible intra-TIPS stenosis/ TIPS malfunction. Had received 2days of 40 Lasix??IV BID for volume overload, transitioned to torsemide 40mg  daily on 09/28 (home was 20mg  daily) without significant diuresis so resumed Lasix 40 mg IV BID on 9/29, again little progress so dose increased 9/30 to 60mg  BID then 80 bid on 10/3 - 5. Still did not notice significant improvement clinically. Patient also reported minimum to no improvement with swelling. After discussion with GI Hepatology, started on Bumex 2 mg BID today. Will monitor strict I/O and daily weight. No current signs of hepatic encephalopathy.  Has anasarca on exam. Reports fatigue and increased SOB today likely due to volume up vs anemia.     MELD-Na score: 18 at 06/24/2019  5:59 AM  MELD score: 16 at 06/24/2019  5:59 AM  Calculated from:  Serum Creatinine: 0.97 mg/dL (Rounded to 1 mg/dL) at 16/09/958  4:54 AM  Serum Sodium: 135 mmol/L at 06/23/2019  6:22 AM  Total Bilirubin: 1.7 mg/dL at 05/26/1190  4:78 AM  INR(ratio): 1.88 at 06/24/2019  5:59 AM  Age: 19 years 1 month       - Continue albumin 25 gm BID- will follow up albumin level in today's lab and consider stopping  - Switched to Bumex 2 mg IV bid in am 10/6  - Follow up liver enzymes and consider stopping albumin based on labs (per Hepatology) ??  - continue home spironolactone at increased dose (home dose 50 mg QD)  - Discussed with GI??on 10/03 and no TIPS intervention due to RV findings on TTE as this would worsen with TIPS revision; they think anasarca more nutritional/hypoalbuminia??related than TIPS dysfunction ??  - Continue Lactulose, titrate to 3-4 BMs daily, continue rifaximin  - daily weight, attempt strict I/Os  - 2G Na diet   - Follow up CBC, CMP, in am  ??  Chronic UGIB secondary to GAVE, acute on chronic iron deficiency and blood loss anemia: APC performed 9/25 for treatment of GAVE as well as 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 However she has had ongoing transfusion dependent anemia despite multiple sessions of APC. Has received 2 U PRBCs this admission (9/25,9/28). Ferritin <??20 consistent with iron deficiency anemia. Did not tolerate Feraheme infusion in the past. The reaction does not sound like anaphylaxis but occurred twice per patient.     Noticed Hb down trending 6.6, repeat also was same. Patient reports extreme fatigue and increased SOB on exertion.  Plan to crossmatch 2 units and transfuse 1 unit.   -Type and screen  -Prepared 2 units of packed RBC  -Transfuse 1 unit of PRBC today  -Recheck hemoglobin after 2 hours of transfusion at 4 pm  -Continue protonix PO 40 BID and carafate QID  -CBC daily   - Per hepatology no antibiotics for SBP prophylaxis needed due  to chronic nature of bleed  - Continue oral iron??polysaccharide;??also contains B12/folate so these must be measured as well as outpatient to ensure not achieving toxic levels (at discharge in prescription enter: follow H/H, iron panel, folate, B12 levles before re-ordering each 30 day supply)  ??  Nausea: Improving. Likely related due to edema causing abdominal fullness vs lactulose  - Zofran and PRN compazine   - Monitor for improvement with diuresis   ??  Type 2 DM:  -??continue??Lantus 15U QAM due to ongoing nausea (home dose 45U)  - Lispro SSI  ??  Depression:   - continue home Zoloft  ??  Hypothyroidism:  - continue home Synthroid > 50% of this encounter 35 minute encounter was spent on counseling and coordination of care involving GI Hepatology, Nursing, Pharmacy and family on NASH Cirrhosis, Anemia secondary to GAVE, diuresis, and Blood product administration.      ___________________________________________________________________    Subjective:  No acute events overnight, Patient states pain well controlled, Tolerating diet without difficulty. Feels improvement with nausea, very fatigue and SOB on minimal exertion. No improvement with LE swelling. Encouraged to elevate LE/ LT arm  and hoping SOB will improve with diuresis.     Recent Results (from the past 24 hour(s))   POCT Glucose    Collection Time: 06/23/19 12:40 PM   Result Value Ref Range    Glucose, POC 215 (H) 70 - 179 mg/dL   POCT Glucose    Collection Time: 06/23/19  4:58 PM   Result Value Ref Range    Glucose, POC 181 (H) 70 - 179 mg/dL   POCT Glucose    Collection Time: 06/23/19  9:17 PM   Result Value Ref Range    Glucose, POC 218 (H) 70 - 179 mg/dL   Magnesium Level    Collection Time: 06/24/19  5:59 AM   Result Value Ref Range    Magnesium 2.0 1.6 - 2.2 mg/dL   CBC    Collection Time: 06/24/19  5:59 AM   Result Value Ref Range    WBC 1.7 (L) 4.5 - 11.0 10*9/L    RBC 2.41 (L) 4.00 - 5.20 10*12/L    HGB 6.6 (L) 12.0 - 16.0 g/dL    HCT 16.1 (L) 09.6 - 46.0 %    MCV 90.8 80.0 - 100.0 fL    MCH 27.6 26.0 - 34.0 pg    MCHC 30.4 (L) 31.0 - 37.0 g/dL    RDW 04.5 (H) 40.9 - 15.0 %    MPV 12.8 (H) 7.0 - 10.0 fL    Platelet 40 (L) 150 - 440 10*9/L   PT-INR    Collection Time: 06/24/19  5:59 AM   Result Value Ref Range    PT 21.9 (H) 10.2 - 13.1 sec    INR 1.88    POCT Glucose    Collection Time: 06/24/19  7:31 AM   Result Value Ref Range    Glucose, POC 120 70 - 179 mg/dL   Type and Screen    Collection Time: 06/24/19  9:42 AM   Result Value Ref Range    ABO Grouping O NEG     Antibody Screen NEG    Hemoglobin and Hematocrit    Collection Time: 06/24/19  9:42 AM   Result Value Ref Range    HGB 6.6 (L) 12.0 - 16.0 g/dL    HCT 81.1 (L) 91.4 - 46.0 %   Prepare RBC    Collection Time: 06/24/19 10:48 AM  Result Value Ref Range    Crossmatch Compatible     Unit Blood Type O Neg     ISBT Number 9500     Unit # [id number]     Status Ready     Spec Expiration [id number]     Product ID Red Blood Cells     PRODUCT CODE [id number]     Crossmatch Compatible     Unit Blood Type O Neg     ISBT Number 9500     Unit # [id number]     Status Ready     Spec Expiration [id number]     Product ID Red Blood Cells     PRODUCT CODE [id number]    POCT Glucose    Collection Time: 06/24/19 11:27 AM   Result Value Ref Range    Glucose, POC 216 (H) 70 - 179 mg/dL     Labs/Studies:  Labs and Studies from the last 24hrs per EMR and Reviewed and   All lab results last 24 hours:    Recent Results (from the past 24 hour(s))   POCT Glucose    Collection Time: 06/23/19  4:58 PM   Result Value Ref Range    Glucose, POC 181 (H) 70 - 179 mg/dL   POCT Glucose    Collection Time: 06/23/19  9:17 PM   Result Value Ref Range    Glucose, POC 218 (H) 70 - 179 mg/dL   Magnesium Level    Collection Time: 06/24/19  5:59 AM   Result Value Ref Range    Magnesium 2.0 1.6 - 2.2 mg/dL   CBC    Collection Time: 06/24/19  5:59 AM   Result Value Ref Range    WBC 1.7 (L) 4.5 - 11.0 10*9/L    RBC 2.41 (L) 4.00 - 5.20 10*12/L    HGB 6.6 (L) 12.0 - 16.0 g/dL    HCT 47.4 (L) 25.9 - 46.0 %    MCV 90.8 80.0 - 100.0 fL    MCH 27.6 26.0 - 34.0 pg    MCHC 30.4 (L) 31.0 - 37.0 g/dL    RDW 56.3 (H) 87.5 - 15.0 %    MPV 12.8 (H) 7.0 - 10.0 fL    Platelet 40 (L) 150 - 440 10*9/L   PT-INR    Collection Time: 06/24/19  5:59 AM   Result Value Ref Range    PT 21.9 (H) 10.2 - 13.1 sec    INR 1.88    Comprehensive Metabolic Panel    Collection Time: 06/24/19  5:59 AM   Result Value Ref Range    Sodium 134 (L) 135 - 145 mmol/L    Potassium 3.6 3.5 - 5.0 mmol/L    Chloride 103 98 - 107 mmol/L    Anion Gap 6 (L) 7 - 15 mmol/L    CO2 25.0 22.0 - 30.0 mmol/L BUN 22 (H) 7 - 21 mg/dL    Creatinine 6.43 3.29 - 1.00 mg/dL    BUN/Creatinine Ratio 23     EGFR CKD-EPI Non-African American, Female 62 >=60 mL/min/1.54m2    EGFR CKD-EPI African American, Female 3 >=60 mL/min/1.49m2    Glucose 119 70 - 179 mg/dL    Calcium 8.4 (L) 8.5 - 10.2 mg/dL    Albumin 2.9 (L) 3.5 - 5.0 g/dL    Total Protein 4.9 (L) 6.5 - 8.3 g/dL    Total Bilirubin 1.8 (H) 0.0 - 1.2 mg/dL    AST 24 14 - 38 U/L    ALT  14 <35 U/L    Alkaline Phosphatase 57 38 - 126 U/L   POCT Glucose    Collection Time: 06/24/19  7:31 AM   Result Value Ref Range    Glucose, POC 120 70 - 179 mg/dL   Type and Screen    Collection Time: 06/24/19  9:42 AM   Result Value Ref Range    ABO Grouping O NEG     Antibody Screen NEG    Hemoglobin and Hematocrit    Collection Time: 06/24/19  9:42 AM   Result Value Ref Range    HGB 6.6 (L) 12.0 - 16.0 g/dL    HCT 16.1 (L) 09.6 - 46.0 %   Prepare RBC    Collection Time: 06/24/19 10:48 AM   Result Value Ref Range    Crossmatch Compatible     Unit Blood Type O Neg     ISBT Number 9500     Unit # [id number]     Status Ready     Spec Expiration [id number]     Product ID Red Blood Cells     PRODUCT CODE [id number]     Crossmatch Compatible     Unit Blood Type O Neg     ISBT Number 9500     Unit # [id number]     Status Ready     Spec Expiration [id number]     Product ID Red Blood Cells     PRODUCT CODE [id number]    POCT Glucose    Collection Time: 06/24/19 11:27 AM   Result Value Ref Range    Glucose, POC 216 (H) 70 - 179 mg/dL       Objective:  Temp:  [36.6 ??C (97.9 ??F)-36.8 ??C (98.2 ??F)] 36.8 ??C (98.2 ??F)  Heart Rate:  [68-72] 72  Resp:  [20] 20  BP: (106-120)/(34-52) 118/48  SpO2:  [97 %-98 %] 97 %    GEN: ill looking, obese female, Lying in bed, no acute distress  HEENT: sclera anicteric, moist mucous membranes  CV: RRR, no murmur  RESP: CTAB, normal WOB  ABD: soft, non-tender, non-distended, ??BS normoactive  EXT: 2+ pitting edema to BUE L>R swelling, 3+ BLE edema, moves all the extremities.   NEURO: alert, oriented x 3, no confusion, no focal deficit noted.

## 2019-06-25 LAB — CBC
HEMATOCRIT: 24.8 % — ABNORMAL LOW (ref 36.0–46.0)
HEMOGLOBIN: 7.6 g/dL — ABNORMAL LOW (ref 12.0–16.0)
MEAN CORPUSCULAR HEMOGLOBIN CONC: 30.6 g/dL — ABNORMAL LOW (ref 31.0–37.0)
MEAN CORPUSCULAR HEMOGLOBIN: 27.5 pg (ref 26.0–34.0)
MEAN PLATELET VOLUME: 13 fL — ABNORMAL HIGH (ref 7.0–10.0)
PLATELET COUNT: 45 10*9/L — ABNORMAL LOW (ref 150–440)
RED CELL DISTRIBUTION WIDTH: 17.8 % — ABNORMAL HIGH (ref 12.0–15.0)
WBC ADJUSTED: 2.4 10*9/L — ABNORMAL LOW (ref 4.5–11.0)

## 2019-06-25 LAB — INR: Coagulation tissue factor induced.INR:RelTime:Pt:PPP:Qn:Coag: 1.97

## 2019-06-25 LAB — COMPREHENSIVE METABOLIC PANEL
ALBUMIN: 3.1 g/dL — ABNORMAL LOW (ref 3.5–5.0)
ALKALINE PHOSPHATASE: 56 U/L (ref 38–126)
ANION GAP: 8 mmol/L (ref 7–15)
AST (SGOT): 23 U/L (ref 14–38)
BILIRUBIN TOTAL: 2.3 mg/dL — ABNORMAL HIGH (ref 0.0–1.2)
BLOOD UREA NITROGEN: 24 mg/dL — ABNORMAL HIGH (ref 7–21)
BUN / CREAT RATIO: 22
CALCIUM: 8.5 mg/dL (ref 8.5–10.2)
CHLORIDE: 101 mmol/L (ref 98–107)
CO2: 24 mmol/L (ref 22.0–30.0)
CREATININE: 1.11 mg/dL — ABNORMAL HIGH (ref 0.60–1.00)
EGFR CKD-EPI AA FEMALE: 61 mL/min/{1.73_m2} (ref >=60)
EGFR CKD-EPI NON-AA FEMALE: 53 mL/min/{1.73_m2} — ABNORMAL LOW (ref >=60)
GLUCOSE RANDOM: 123 mg/dL (ref 70–179)
POTASSIUM: 3.9 mmol/L (ref 3.5–5.0)
PROTEIN TOTAL: 5.2 g/dL — ABNORMAL LOW (ref 6.5–8.3)
SODIUM: 133 mmol/L — ABNORMAL LOW (ref 135–145)

## 2019-06-25 LAB — RED BLOOD CELL COUNT: Lab: 2.76 — ABNORMAL LOW

## 2019-06-25 LAB — MAGNESIUM
MAGNESIUM: 2 mg/dL (ref 1.6–2.2)
Magnesium:MCnc:Pt:Ser/Plas:Qn:: 2

## 2019-06-25 LAB — BILIRUBIN TOTAL: Bilirubin:MCnc:Pt:Ser/Plas:Qn:: 2.4 — ABNORMAL HIGH

## 2019-06-25 LAB — GLUCOSE RANDOM: Glucose:MCnc:Pt:Ser/Plas:Qn:: 123

## 2019-06-25 NOTE — Unmapped (Signed)
HEPATOLOGY INPATIENT FOLLOW UP NOTE     Requesting Attending Physician:  Joesph July, FNP    Reason for Consult:  Marilyn French is a 63 y.o. female seen in consultation at the request of Dr. Joesph July, FNP for cirrhosis    Interval History:  - patient reports edema improved and felt like urinated a lot yesterday  - weight stable however      ASSESSMENT / PLAN:     55F w/hx of NAFLD cirrhosis (d/b ascites, HE, EV bleeding), s/p TIPS 06/2018??and embolization of mesocaval varices,??w/subsequent worsening HE, also c/b GAVE requiring multiple episodes APC, who presents as direct admission from endoscopy for planned AM capsule endoscopy in s/o chronic GIB.??Capsule showed clinically insignificant angio ectasia in the iluem. No evidence of further bleeding. Hepatology now also following diffuse anasarca/volume overload.   ??  For workup of volume overload in the setting of patient having a TIPS a [place] TIPS was done which showed findings concerning for intra-TIPS stenosis. Of note, the blood flow was hepatofugal through the TIPS and hepatopetal through the portal vein which also indicates possible TIPS malfunction. While this is a possible cause for her volume overload we would expect this to cause more issues with ascites instead of diffuse anasarca like she is presenting with. TTE??done shows elevated RVSP, moderately dilated RV, severe dilation RA with normal RA pressure, normal LV function. Findings indicate that some of volume overload due to heart failure and intervention to option up TIPS would be high risk for complications. Given these findings do not recommend TIPS revision. Another likely contributing factor to her??diffuse anasarca is simply worsening of her low albumin state (albumin 1.7, was high 2s several months ago).??This is due to a combination of her liver disease and malnutrition.??Her diffuse volume overload is also causing bowel wall edema which is contributing to her endoscopic findings of severe GAVE and chronic GI bleeding.??  ??  RECOMMENDATIONS:  ??  #Anasarca  #LE edema  #Hypoalbuminemia  -??continue bumex 2mg  IV BID and continue spironolactone 100mg  daily  - consider transition to oral diuretics 10/8  - strict intake/output, daily weights, follow creatinine closely  - continue 2g Na diet, does not need a fluid restriction  - check liver enzymes daily  - encourage high protein intake given hypoalbuminemia, agree with nutritional supplements  ??  GAVE:  - Carafate QID  - BID PPI  - Re-treatment of GAVE in 4 weeks (EGD Ordered)    Outpatient hepatology follow up has been arranged.    Thank you for this consult. The patient was Discussed with and seen by Dr. Sherryll Burger. We will continue to follow along. Please page hepatology fellow on call with questions.     Hyman Bible, MD  Gastroenterology & Hepatology Fellow, PGY-4  [employer] [facility]           Medications:  Current Facility-Administered Medications   Medication Dose Route Frequency Provider Last Rate Last Dose   ??? acetaminophen (TYLENOL) tablet 500 mg  500 mg Oral Q6H PRN Pixie Casino, MD   500 mg at 06/24/19 2006   ??? aluminum-magnesium hydroxide-simethicone (MAALOX MAX) 80-80-8 mg/mL oral suspension  30 mL Oral Q4H PRN Orvilla Fus, MD       ??? atorvastatin (LIPITOR) tablet 10 mg  10 mg Oral Nightly Nelta Numbers Sasaki-Adams, MD   10 mg at 06/24/19 2006   ??? bumetanide (BUMEX) injection 2 mg  2 mg Intravenous BID Joesph July, FNP   2 mg at 06/25/19  1610   ??? calcium carbonate (TUMS) chewable tablet 400 mg elem calcium  400 mg elem calcium Oral BID PRN Orvilla Fus, MD       ??? cholecalciferol (vitamin D3) tablet 1,000 Units  1,000 Units Oral Daily Orvilla Fus, MD   1,000 Units at 06/25/19 0932   ??? dextrose 50 % in water (D50W) 50 % solution 12.5 g  12.5 g Intravenous Q10 Min PRN Nelta Numbers Sasaki-Adams, MD       ??? guaiFENesin (ROBITUSSIN) oral syrup  200 mg Oral Q4H PRN Nelta Numbers Sasaki-Adams, MD       ??? heparin, porcine (PF) 100 unit/mL injection 500 Units  500 Units Intravenous Daily PRN [facility], AGNP   500 Units at 06/23/19 1316   ??? influenza vaccine quad (FLUARIX, FLULAVAL, FLUZONE) (6 MOS & UP) 2020-21  0.5 mL Intramuscular During hospitalization Orvilla Fus, MD       ??? insulin glargine (LANTUS) injection 15 Units  15 Units Subcutaneous daily Verl Bangs, MD   15 Units at 06/25/19 0929   ??? insulin lispro (HumaLOG) injection 0-12 Units  0-12 Units Subcutaneous ACHS Orvilla Fus, MD   4 Units at 06/24/19 2105   ??? lactated Ringers infusion  10 mL/hr Intravenous Continuous Orvilla Fus, MD 10 mL/hr at 06/13/19 0847 10 mL/hr at 06/13/19 0847   ??? lactulose (CHRONULAC) oral solution (30 mL cup)  20 g Oral TID Pixie Casino, MD   20 g at 06/25/19 0932   ??? levothyroxine (SYNTHROID) tablet 25 mcg  25 mcg Oral daily Orvilla Fus, MD   25 mcg at 06/25/19 0554   ??? magnesium oxide (MAG-OX) tablet 800 mg  800 mg Oral Daily Nelta Numbers Sasaki-Adams, MD   800 mg at 06/25/19 0932   ??? melatonin tablet 6 mg  6 mg Oral Nightly PRN Nelta Numbers Sasaki-Adams, MD       ??? ondansetron (ZOFRAN-ODT) disintegrating tablet 8 mg  8 mg Oral Q8H PRN Orvilla Fus, MD   8 mg at 06/19/19 0758    Or   ??? ondansetron (ZOFRAN) injection 4 mg  4 mg Intravenous Q8H PRN Orvilla Fus, MD   4 mg at 06/17/19 1317   ??? pantoprazole (PROTONIX) EC tablet 40 mg  40 mg Oral BID Orvilla Fus, MD   40 mg at 06/25/19 0932   ??? polysaccharide iron complex (NIFEREX) capsule 150 mg  150 mg Oral Daily Verl Bangs, MD   150 mg at 06/25/19 0931   ??? potassium chloride (KLOR-CON) CR tablet 20 mEq  20 mEq Oral Daily Orvilla Fus, MD   20 mEq at 06/25/19 0931   ??? prochlorperazine (COMPAZINE) 10 mg in sodium chloride (NS) 0.9 % 25 mL IVPB  10 mg Intravenous Q8H PRN Pixie Casino, MD       ??? rifAXIMin Burman [employer]) tablet 550 mg  550 mg Oral BID Orvilla Fus, MD   550 mg at 06/25/19 0932   ??? senna (SENOKOT) tablet 2 tablet  2 tablet Oral Nightly PRN Orvilla Fus, MD       ??? sertraline (ZOLOFT) tablet 50 mg  50 mg Oral Daily Orvilla Fus, MD   50 mg at 06/25/19 0932   ??? sodium chloride (NS) 0.9 % infusion   Intravenous Continuous Verl Bangs, MD   500 mL at 06/16/19 1300   ??? spironolactone (ALDACTONE) tablet 100 mg  100 mg Oral Daily  Pixie Casino, MD   100 mg at 06/25/19 0932   ??? sucralfate (CARAFATE) oral suspension  1 g Oral 4x Daily Orvilla Fus, MD   1 g at 06/25/19 0554       Vital Signs:  Temp:  [36.5 ??C-36.8 ??C] 36.7 ??C  Heart Rate:  [67-78] 72  Resp:  [18-24] 22  BP: (103-119)/(37-59) 103/37  MAP (mmHg):  [56-69] 56  SpO2:  [95 %-99 %] 95 %    Intake/Output last 3 shifts:  I/O last 3 completed shifts:  In: 2050 [P.O.:1600; Blood:450]  Out: 1700 [Urine:1700]    Physical Exam:  Constitutional: NAD  HEENT: no sleral icterus  CV: Regular  Pulmonary: normal WOB  Abdomen: soft, normal bowel sounds, non-distended, non-tender.  Skin: No jaundice   Extremities: warm, + LUE edema, + LE edema 2+  Neuro: Alert, Oriented x 3, No focal deficits. No asterixis    Diagnostic Studies:   Labs:  Recent Labs     06/24/19  0559 06/24/19  0942 06/24/19  1722 06/25/19  0556   WBC 1.7*  --  2.5* 2.4*   HGB 6.6* 6.6* 7.7* 7.6*   HCT 21.9* 21.8* 24.6* 24.8*   PLT 40*  --  41* 45*     Recent Labs     06/23/19  0622 06/24/19  0559 06/25/19  0556   NA 135 134* 133*   K 3.9 3.6 3.9   CL 103 103 101   BUN 23* 22* 24*   CREATININE 0.97 0.97 1.11*   GLU 132 119 123     Recent Labs     06/23/19  0622 06/24/19  0559 06/25/19  0556   PROT  --  4.9* 5.2*   ALBUMIN  --  2.9* 3.1*   AST  --  24 23   ALT  --  14 13   ALKPHOS  --  57 56   BILITOT 1.7* 1.8* 2.3* - 2.4*     Recent Labs     06/23/19  0622 06/24/19  0559 06/25/19  0556   INR 1.84 1.88 1.97

## 2019-06-25 NOTE — Unmapped (Signed)
Daily Progress Note    Assessment/Plan:    Principal Problem:    Upper GI bleeding  Active Problems:    NAFLD (nonalcoholic fatty liver disease)    Cirrhosis (CMS-HCC)    GAVE (gastric antral vascular ectasia)    Chronic blood loss anemia  Resolved Problems:    * No resolved hospital problems. Marilyn French??is a 63 y.o.??female??who presented to [facility] with Upper GI bleeding.  ??  Decompensated NASH cirrhosis c/b portal hypertension, ascites, history of hepatic encephalopathy: Hepatology consulted. Patient is s/p TIPS (06/2018). Currently with significant fluid overload. ??TIPS ultrasound 9/30 concerning for possible intra-TIPS stenosis/ TIPS malfunction. Had received 2days of 40 Lasix??IV BID for volume overload, transitioned to torsemide 40mg  daily on 09/28 (home was??20mg  daily) without significant diuresis so resumed Lasix 40 mg IV BID on 9/29, again little progress so dose increased 9/30 to 60mg ??BID then 80 bid on 10/3 - 5. Still did not notice significant improvement clinically. Patient also reported minimum to no improvement with swelling. After discussion with GI Hepatology, started on Bumex 2 mg BID 10/6, noticed mild improvement with urine output. However, clinically not significant improvement. Cr is trending up this morning. Will continue Bumex BID today, hold tomorrows dose for now with the plan to resume after reviewing morning Cr.    No current signs of hepatic encephalopathy. Has anasarca on exam.     MELD-Na score: 21 at 06/25/2019  5:56 AM  MELD score: 18 at 06/25/2019  5:56 AM  Calculated from:  Serum Creatinine: 1.11 mg/dL at 05/26/1190  4:78 AM  Serum Sodium: 133 mmol/L at 06/25/2019  5:56 AM  Total Bilirubin: 2.4 mg/dL at 29/01/6212  0:86 AM  INR(ratio): 1.97 at 06/25/2019  5:56 AM  Age: 63 years 1 month??  ??  - Discontinued albumin given improvement in albumin level   - Continue Bumex 2 mg IV bid( started 10/6)- holding morning dose for now, plan to resume based on Cr level??  - continue spironolactone 100 mg daily  (home dose 50 mg QD)  - Discussed with GI??on 10/03 and no TIPS intervention due to RV findings on TTE as this would worsen with TIPS revision; they think anasarca more nutritional/hypoalbuminia??related than TIPS dysfunction ??  - Continue Lactulose, titrate to 3-4 BMs daily, continue rifaximin  - daily weight, attempt strict I/Os  - 2G Na diet??  - Follow up CBC, CMP, in am  ??  Chronic UGIB secondary to GAVE, acute on chronic iron deficiency and blood loss anemia: APC performed 9/25 for treatment of GAVE as well as 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 However she has had ongoing transfusion dependent anemia despite multiple sessions of APC. Has received 2 U PRBCs this admission (9/25,9/28). Ferritin <??20 consistent with iron deficiency anemia. Did not tolerate Feraheme infusion in the past. The reaction does not sound like anaphylaxis but occurred twice per patient. S/p one unit of PRBC 10/6, Hb improved to 7.6 from 6.6. will continue to monitor.     -S/p 1 unit of PRBC 10/6  -Continue protonix PO 40 BID and carafate QID  -CBC daily   - Per hepatology no antibiotics for SBP prophylaxis needed due to chronic nature of bleed  -??Continue oral iron??polysaccharide;??also contains B12/folate so these must be measured as well as outpatient to ensure not achieving toxic levels (at discharge in prescription enter: follow H/H, iron panel, folate, B12 levles before re-ordering each 30  day supply)  ??  Nausea:??Improving. Likely related??due to edema causing abdominal fullness vs lactulose  - Zofran and PRN compazine   - Monitor for improvement with diuresis   ??  Type 2 DM:  -??continue??Lantus 15U QAM due to ongoing nausea (home dose 45U)  - Lispro SSI  ??  Depression:   - continue home Zoloft  ??  Hypothyroidism:  - continue home Synthroid??  ??  > 50% of this encounter 35 minute encounter was spent on counseling and coordination of care involving GI Hepatology, Nursing, Pharmacy and family on NASH Cirrhosis, Anemia secondary to GAVE, diuresis.    ___________________________________________________________________    Subjective:  No acute events overnight, Patient states pain well controlled, Tolerating diet without difficulty. Feels improvement with nausea, but still very fatigue and SOB on minimal exertion. minimal improvement with LE swelling. Significant improvement with LT arm swelling. ??    Recent Results (from the past 24 hour(s))   POCT Glucose    Collection Time: 06/24/19  4:48 PM   Result Value Ref Range    Glucose, POC 160 70 - 179 mg/dL   CBC    Collection Time: 06/24/19  5:22 PM   Result Value Ref Range    WBC 2.5 (L) 4.5 - 11.0 10*9/L    RBC 2.75 (L) 4.00 - 5.20 10*12/L    HGB 7.7 (L) 12.0 - 16.0 g/dL    HCT 16.1 (L) 09.6 - 46.0 %    MCV 89.6 80.0 - 100.0 fL    MCH 28.0 26.0 - 34.0 pg    MCHC 31.2 31.0 - 37.0 g/dL    RDW 04.5 (H) 40.9 - 15.0 %    MPV 12.9 (H) 7.0 - 10.0 fL    Platelet 41 (L) 150 - 440 10*9/L   POCT Glucose    Collection Time: 06/24/19  8:21 PM   Result Value Ref Range    Glucose, POC 239 (H) 70 - 179 mg/dL   Magnesium Level    Collection Time: 06/25/19  5:56 AM   Result Value Ref Range    Magnesium 2.0 1.6 - 2.2 mg/dL   Bilirubin, total    Collection Time: 06/25/19  5:56 AM   Result Value Ref Range    Total Bilirubin 2.4 (H) 0.0 - 1.2 mg/dL   CBC    Collection Time: 06/25/19  5:56 AM   Result Value Ref Range    WBC 2.4 (L) 4.5 - 11.0 10*9/L    RBC 2.76 (L) 4.00 - 5.20 10*12/L    HGB 7.6 (L) 12.0 - 16.0 g/dL    HCT 81.1 (L) 91.4 - 46.0 %    MCV 89.9 80.0 - 100.0 fL    MCH 27.5 26.0 - 34.0 pg    MCHC 30.6 (L) 31.0 - 37.0 g/dL    RDW 78.2 (H) 95.6 - 15.0 %    MPV 13.0 (H) 7.0 - 10.0 fL    Platelet 45 (L) 150 - 440 10*9/L   PT-INR    Collection Time: 06/25/19  5:56 AM   Result Value Ref Range    PT 23.0 (H) 10.2 - 13.1 sec    INR 1.97    Comprehensive Metabolic Panel    Collection Time: 06/25/19  5:56 AM   Result Value Ref Range    Sodium 133 (L) 135 - 145 mmol/L    Potassium 3.9 3.5 - 5.0 mmol/L    Chloride 101 98 - 107 mmol/L    Anion Gap 8 7 - 15  mmol/L    CO2 24.0 22.0 - 30.0 mmol/L    BUN 24 (H) 7 - 21 mg/dL    Creatinine 5.78 (H) 0.60 - 1.00 mg/dL    BUN/Creatinine Ratio 22     EGFR CKD-EPI Non-African American, Female 53 (L) >=60 mL/min/1.42m2    EGFR CKD-EPI African American, Female 38 >=60 mL/min/1.52m2    Glucose 123 70 - 179 mg/dL    Calcium 8.5 8.5 - 46.9 mg/dL    Albumin 3.1 (L) 3.5 - 5.0 g/dL    Total Protein 5.2 (L) 6.5 - 8.3 g/dL    Total Bilirubin 2.3 (H) 0.0 - 1.2 mg/dL    AST 23 14 - 38 U/L    ALT 13 <35 U/L    Alkaline Phosphatase 56 38 - 126 U/L   POCT Glucose    Collection Time: 06/25/19  7:19 AM   Result Value Ref Range    Glucose, POC 124 70 - 179 mg/dL   POCT Glucose    Collection Time: 06/25/19 11:53 AM   Result Value Ref Range    Glucose, POC 129 70 - 179 mg/dL     Labs/Studies:  Labs and Studies from the last 24hrs per EMR and Reviewed and   All lab results last 24 hours:    Recent Results (from the past 24 hour(s))   POCT Glucose    Collection Time: 06/24/19  4:48 PM   Result Value Ref Range    Glucose, POC 160 70 - 179 mg/dL   CBC    Collection Time: 06/24/19  5:22 PM   Result Value Ref Range    WBC 2.5 (L) 4.5 - 11.0 10*9/L    RBC 2.75 (L) 4.00 - 5.20 10*12/L    HGB 7.7 (L) 12.0 - 16.0 g/dL    HCT 62.9 (L) 52.8 - 46.0 %    MCV 89.6 80.0 - 100.0 fL    MCH 28.0 26.0 - 34.0 pg    MCHC 31.2 31.0 - 37.0 g/dL    RDW 41.3 (H) 24.4 - 15.0 %    MPV 12.9 (H) 7.0 - 10.0 fL    Platelet 41 (L) 150 - 440 10*9/L   POCT Glucose    Collection Time: 06/24/19  8:21 PM   Result Value Ref Range    Glucose, POC 239 (H) 70 - 179 mg/dL   Magnesium Level    Collection Time: 06/25/19  5:56 AM   Result Value Ref Range    Magnesium 2.0 1.6 - 2.2 mg/dL   Bilirubin, total    Collection Time: 06/25/19  5:56 AM   Result Value Ref Range    Total Bilirubin 2.4 (H) 0.0 - 1.2 mg/dL   CBC    Collection Time: 06/25/19  5:56 AM   Result Value Ref Range    WBC 2.4 (L) 4.5 - 11.0 10*9/L    RBC 2.76 (L) 4.00 - 5.20 10*12/L    HGB 7.6 (L) 12.0 - 16.0 g/dL    HCT 01.0 (L) 27.2 - 46.0 %    MCV 89.9 80.0 - 100.0 fL    MCH 27.5 26.0 - 34.0 pg    MCHC 30.6 (L) 31.0 - 37.0 g/dL    RDW 53.6 (H) 64.4 - 15.0 %    MPV 13.0 (H) 7.0 - 10.0 fL    Platelet 45 (L) 150 - 440 10*9/L   PT-INR    Collection Time: 06/25/19  5:56 AM   Result Value Ref Range    PT 23.0 (  H) 10.2 - 13.1 sec    INR 1.97    Comprehensive Metabolic Panel    Collection Time: 06/25/19  5:56 AM   Result Value Ref Range    Sodium 133 (L) 135 - 145 mmol/L    Potassium 3.9 3.5 - 5.0 mmol/L    Chloride 101 98 - 107 mmol/L    Anion Gap 8 7 - 15 mmol/L    CO2 24.0 22.0 - 30.0 mmol/L    BUN 24 (H) 7 - 21 mg/dL    Creatinine 1.61 (H) 0.60 - 1.00 mg/dL    BUN/Creatinine Ratio 22     EGFR CKD-EPI Non-African American, Female 53 (L) >=60 mL/min/1.67m2    EGFR CKD-EPI African American, Female 33 >=60 mL/min/1.83m2    Glucose 123 70 - 179 mg/dL    Calcium 8.5 8.5 - 09.6 mg/dL    Albumin 3.1 (L) 3.5 - 5.0 g/dL    Total Protein 5.2 (L) 6.5 - 8.3 g/dL    Total Bilirubin 2.3 (H) 0.0 - 1.2 mg/dL    AST 23 14 - 38 U/L    ALT 13 <35 U/L    Alkaline Phosphatase 56 38 - 126 U/L   POCT Glucose    Collection Time: 06/25/19  7:19 AM   Result Value Ref Range    Glucose, POC 124 70 - 179 mg/dL   POCT Glucose    Collection Time: 06/25/19 11:53 AM   Result Value Ref Range    Glucose, POC 129 70 - 179 mg/dL       Objective:  Temp:  [36.7 ??C (98.1 ??F)] 36.7 ??C (98.1 ??F)  Heart Rate:  [67-72] 70  Resp:  [18-24] 18  BP: (103-119)/(33-50) 108/33  SpO2:  [95 %-99 %] 96 %    GEN:??ill looking, obese female,??Lying in bed, no acute distress  HEENT: sclera anicteric, moist mucous membranes  CV: RRR, no murmur  RESP: CTAB, normal WOB  ABD: soft, non-tender, non-distended, ??BS normoactive  EXT: 2+ pitting edema to BUE L>R swelling, 3+ BLE edema, moves all the extremities.   NEURO: alert, oriented x 3, no confusion, no focal deficit noted.

## 2019-06-25 NOTE — Unmapped (Signed)
Patient remains alert and oriented. Tylenol given for generalized pain with improvement. Patient was able to was up at bedside, DOE with frequent rest breaks. OOB to commode, 2 BMs this shift and continues to void well.  No blood visible in stool.  Will continue POC       Problem: Adult Inpatient Plan of Care  Goal: Plan of Care Review  Outcome: Ongoing - Unchanged  Goal: Patient-Specific Goal (Individualization)  Outcome: Ongoing - Unchanged  Goal: Absence of Hospital-Acquired Illness or Injury  Outcome: Ongoing - Unchanged  Goal: Optimal Comfort and Wellbeing  Outcome: Ongoing - Unchanged  Goal: Readiness for Transition of Care  Outcome: Ongoing - Unchanged  Goal: Rounds/Family Conference  Outcome: Ongoing - Unchanged     Problem: Fall Injury Risk  Goal: Absence of Fall and Fall-Related Injury  Outcome: Ongoing - Unchanged     Problem: Self-Care Deficit  Goal: Improved Ability to Complete Activities of Daily Living  Outcome: Ongoing - Unchanged     Problem: Diabetes Comorbidity  Goal: Blood Glucose Level Within Desired Range  Outcome: Ongoing - Unchanged

## 2019-06-25 NOTE — Unmapped (Signed)
Problem: Adult Inpatient Plan of Care  Goal: Plan of Care Review  Outcome: Progressing  Goal: Patient-Specific Goal (Individualization)  Outcome: Progressing  Goal: Absence of Hospital-Acquired Illness or Injury  Outcome: Progressing  Goal: Optimal Comfort and Wellbeing  Outcome: Progressing  Goal: Readiness for Transition of Care  Outcome: Progressing  Goal: Rounds/Family Conference  Outcome: Progressing

## 2019-06-26 LAB — PROTIME: Coagulation tissue factor induced:Time:Pt:PPP:Qn:Coag: 21 — ABNORMAL HIGH

## 2019-06-26 LAB — COMPREHENSIVE METABOLIC PANEL
ALBUMIN: 3 g/dL — ABNORMAL LOW (ref 3.5–5.0)
ALKALINE PHOSPHATASE: 63 U/L (ref 38–126)
ALT (SGPT): 14 U/L (ref <35)
ANION GAP: 6 mmol/L — ABNORMAL LOW (ref 7–15)
BILIRUBIN TOTAL: 2.2 mg/dL — ABNORMAL HIGH (ref 0.0–1.2)
BLOOD UREA NITROGEN: 28 mg/dL — ABNORMAL HIGH (ref 7–21)
BUN / CREAT RATIO: 27
CALCIUM: 8.5 mg/dL (ref 8.5–10.2)
CHLORIDE: 107 mmol/L (ref 98–107)
CO2: 23 mmol/L (ref 22.0–30.0)
CREATININE: 1.04 mg/dL — ABNORMAL HIGH (ref 0.60–1.00)
EGFR CKD-EPI AA FEMALE: 66 mL/min/{1.73_m2} (ref >=60)
GLUCOSE RANDOM: 123 mg/dL (ref 70–179)
POTASSIUM: 3.9 mmol/L (ref 3.5–5.0)
SODIUM: 136 mmol/L (ref 135–145)

## 2019-06-26 LAB — CBC
HEMATOCRIT: 25.7 % — ABNORMAL LOW (ref 36.0–46.0)
HEMOGLOBIN: 7.7 g/dL — ABNORMAL LOW (ref 12.0–16.0)
MEAN CORPUSCULAR HEMOGLOBIN: 26.9 pg (ref 26.0–34.0)
MEAN CORPUSCULAR VOLUME: 89.5 fL (ref 80.0–100.0)
PLATELET COUNT: 38 10*9/L — ABNORMAL LOW (ref 150–440)
RED BLOOD CELL COUNT: 2.87 10*12/L — ABNORMAL LOW (ref 4.00–5.20)
RED CELL DISTRIBUTION WIDTH: 18.1 % — ABNORMAL HIGH (ref 12.0–15.0)
WBC ADJUSTED: 2.5 10*9/L — ABNORMAL LOW (ref 4.5–11.0)

## 2019-06-26 LAB — RED BLOOD CELL COUNT: Lab: 2.87 — ABNORMAL LOW

## 2019-06-26 LAB — MAGNESIUM: Magnesium:MCnc:Pt:Ser/Plas:Qn:: 2.1

## 2019-06-26 LAB — BILIRUBIN TOTAL
Bilirubin:MCnc:Pt:Ser/Plas:Qn:: 2.2 — ABNORMAL HIGH
Bilirubin:MCnc:Pt:Ser/Plas:Qn:: 2.3 — ABNORMAL HIGH

## 2019-06-26 NOTE — Unmapped (Signed)
Alert and oriented x 4, stand by assist to commode, tolerating all meds, needs a lot of emotional reassurance regarding being in the hospital, no falls and no injuries, cont plan of care.  Problem: Adult Inpatient Plan of Care  Goal: Plan of Care Review  Outcome: Ongoing - Unchanged  Goal: Patient-Specific Goal (Individualization)  Outcome: Ongoing - Unchanged  Flowsheets (Taken 06/26/2019 1503)  Patient-Specific Goals (Include Timeframe): will have at least 3 stools this day  Goal: Absence of Hospital-Acquired Illness or Injury  Outcome: Ongoing - Unchanged  Goal: Optimal Comfort and Wellbeing  Outcome: Ongoing - Unchanged  Goal: Readiness for Transition of Care  Outcome: Ongoing - Unchanged  Goal: Rounds/Family Conference  Outcome: Ongoing - Unchanged     Problem: Fall Injury Risk  Goal: Absence of Fall and Fall-Related Injury  Outcome: Ongoing - Unchanged     Problem: Self-Care Deficit  Goal: Improved Ability to Complete Activities of Daily Living  Outcome: Ongoing - Unchanged     Problem: Diabetes Comorbidity  Goal: Blood Glucose Level Within Desired Range  Outcome: Ongoing - Unchanged

## 2019-06-26 NOTE — Unmapped (Signed)
HEPATOLOGY INPATIENT TREATMENT PLAN NOTE     Interval History:     - no acute events    Assessment:    61F w/hx of NAFLD cirrhosis (d/b ascites, HE, EV bleeding), s/p TIPS 06/2018??and embolization of mesocaval varices,??w/subsequent worsening HE, also c/b GAVE requiring multiple episodes APC, who presents as direct admission from endoscopy for planned AM capsule endoscopy in s/o chronic GIB.??Capsule showed clinically insignificant angio ectasia in the iluem. No evidence of further bleeding. Hepatology now also following diffuse anasarca/volume overload.   ??  For workup of volume overload in the setting of patient having a TIPS a [place] TIPS was done which showed findings concerning for intra-TIPS stenosis. Of note, the blood flow was hepatofugal through the TIPS and hepatopetal through the portal vein which also indicates possible TIPS malfunction. While this is a possible cause for her volume overload we would expect this to cause more issues with ascites instead of diffuse anasarca like she is presenting with. TTE??done shows elevated RVSP, moderately dilated RV, severe dilation RA with normal RA pressure, normal LV function. Findings indicate that some of volume overload due to heart failure and intervention to option up TIPS would be high risk for complications. Given these findings do not recommend TIPS revision. Another likely contributing factor to her??diffuse anasarca is simply worsening of her low albumin state (albumin 1.7, was high 2s several months ago).??This is due to a combination of her liver disease and malnutrition.??Her diffuse volume overload is also causing bowel wall edema which is contributing to her endoscopic findings of severe GAVE and chronic GI bleeding.??  ??  RECOMMENDATIONS:  ??  #Anasarca  #LE edema  #Hypoalbuminemia  -??agree with transition to bumex 2mg  po BID and continue??spironolactone 100mg  daily  - strict intake/output,??daily weights,??follow creatinine closely  - continue 2g Na diet, does not need a fluid restriction  - check liver enzymes daily  - encourage high protein intake given hypoalbuminemia, agree with nutritional supplements  - anticipate ready for discharge on 10/9  - consult PT/OT given her deconditioning, this could be a potential barrier to transplant  ??  GAVE:  - Carafate QID  - BID PPI  - Re-treatment of GAVE in 4 weeks (EGD Ordered)  ??  Outpatient hepatology follow up has been arranged.    Thank you for this consult. They patient was Discussed with  Dr. Sherryll Burger. We will continue to follow along. Please page hepatology fellow on call with questions.     Hyman Bible, MD  Gastroenterology & Hepatology Fellow, PGY-4  [facility]

## 2019-06-26 NOTE — Unmapped (Signed)
Daily Progress Note    Assessment/Plan:    Principal Problem:    Upper GI bleeding  Active Problems:    NAFLD (nonalcoholic fatty liver disease)    Cirrhosis (CMS-HCC)    GAVE (gastric antral vascular ectasia)    Chronic blood loss anemia  Resolved Problems:    * No resolved hospital problems. Marilyn Frenchis a 64 y.o.??female??who presented to [facility] with Upper GI bleeding.  ??  Decompensated NASH cirrhosis c/b portal hypertension, ascites, history of hepatic encephalopathy: Hepatology consulted. Patient is s/p TIPS (06/2018). Currently with significant fluid overload. ??TIPS ultrasound 9/30 concerning for possible intra-TIPS stenosis/ TIPS malfunction. Had received 2days of 40 Lasix??IV BID for volume overload, transitioned to torsemide 40mg  daily on 09/28 (home was??20mg  daily) without significant diuresis so resumed Lasix 40 mg IV BID on 9/29, again little progress so dose increased 9/30 to 60mg ??BID then 80 bid on 10/3??- 5. Still did not notice significant improvement clinically. Patient also reported minimum to no improvement with swelling. After discussion with??GI Hepatology,??started on??Bumex 2 mg BID 10/6, noticed mild improvement with urine output. However, clinically not significant improvement. Cr is mildly elevated but stable. Other labs are consistent with liver disease. Transitioned to oral diuresis this morning. On exam, still has anasarca, no current signs of hepatic encephalopathy.  ??  MELD-Na score: 17 at 06/26/2019  6:23 AM  MELD score: 16 at 06/26/2019  6:23 AM  Calculated from:  Serum Creatinine: 1.04 mg/dL at 29/01/6212  0:86 AM  Serum Sodium: 136 mmol/L at 06/26/2019  6:23 AM  Total Bilirubin: 2.3 mg/dL at 57/04/4695  2:95 AM  INR(ratio): 1.80 at 06/26/2019  6:23 AM  Age: 63 years 1 month??    - Appreciate GI assist  - Switched to po Bumex 2 mg  bid( started 10/8)  - continue spironolactone 100 mg daily  (home dose 50 mg QD)  - Discussed with GI??on 10/03 and no TIPS intervention due to RV findings on TTE as this would worsen with TIPS revision; they think anasarca more nutritional/hypoalbuminia??related than TIPS dysfunction ??  -??Continue Lactulose, titrate to 3-4 BMs daily, continue rifaximin  - daily weight, attempt strict I/Os  - 2G Na diet??  -??Follow up??daily CBC, CMP, in am  ??  Chronic UGIB secondary to GAVE, acute on chronic iron deficiency and blood loss anemia: APC performed 9/25 for treatment of GAVE as well as 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 However she has had ongoing transfusion dependent anemia despite multiple sessions of APC. Has received 2 U PRBCs this admission (9/25,9/28)  and 1 unit PRBC on 10/6). Hb improved after transfusion from 6.6 to 7.6. Hb continuing to be stable 7.7 today.  Ferritin <??20 consistent with iron deficiency anemia. Did not tolerate Feraheme infusion in the past. The reaction does not sound like anaphylaxis but occurred twice per patient. ??    -Continue protonix PO 40 BID and carafate QID  -CBC daily   - Per hepatology no antibiotics for SBP prophylaxis needed due to chronic nature of bleed  -??Continue oral iron??polysaccharide;??also contains B12/folate so these must be measured as well as outpatient to ensure not achieving toxic levels (at discharge in prescription enter: follow H/H, iron panel, folate, B12 levles before re-ordering each 30 day supply)  ??  Nausea:??Improving. Likely related??due to edema causing abdominal fullness vs lactulose  - Zofran and PRN compazine   - Monitor for improvement with diuresis   ??  Type 2 DM:  -??continue??Lantus 15U QAM due to ongoing nausea (home dose 45U)  - Lispro SSI  ??  Depression:   - continue home Zoloft  ??  Hypothyroidism:  - continue home Synthroid??  ??  > 50% of this encounter??35 minute encounter was spent on counseling and coordination of care involving??GI Hepatology, Nursing, Pharmacy and family on??NASH Cirrhosis, Anemia secondary to GAVE,??diuresis. ___________________________________________________________________    Subjective:  No acute events overnight, Patient states pain well controlled, Tolerating diet without difficulty. Feels improvement with nausea,??but still very fatigue and SOB on minimal exertion. minimal improvement with LE swelling. Significant improvement with LT arm swelling. ??    Recent Results (from the past 24 hour(s))   POCT Glucose    Collection Time: 06/25/19 11:53 AM   Result Value Ref Range    Glucose, POC 129 70 - 179 mg/dL   POCT Glucose    Collection Time: 06/25/19  4:55 PM   Result Value Ref Range    Glucose, POC 159 70 - 179 mg/dL   Prepare RBC    Collection Time: 06/25/19  7:01 PM   Result Value Ref Range    Crossmatch Compatible     Unit Blood Type O Neg     ISBT Number 9500     Unit # [id number]     Status Ready     Spec Expiration [id number]     Product ID Red Blood Cells     PRODUCT CODE [id number]     Crossmatch Compatible     Unit Blood Type O Neg     ISBT Number 9500     Unit # [id number]     Status Transfused     Product ID Red Blood Cells     PRODUCT CODE [id number]    POCT Glucose    Collection Time: 06/25/19  8:23 PM   Result Value Ref Range    Glucose, POC 173 70 - 179 mg/dL   Magnesium Level    Collection Time: 06/26/19  6:23 AM   Result Value Ref Range    Magnesium 2.1 1.6 - 2.2 mg/dL   Bilirubin, total    Collection Time: 06/26/19  6:23 AM   Result Value Ref Range    Total Bilirubin 2.3 (H) 0.0 - 1.2 mg/dL   CBC    Collection Time: 06/26/19  6:23 AM   Result Value Ref Range    WBC 2.5 (L) 4.5 - 11.0 10*9/L    RBC 2.87 (L) 4.00 - 5.20 10*12/L    HGB 7.7 (L) 12.0 - 16.0 g/dL    HCT 18.8 (L) 41.6 - 46.0 %    MCV 89.5 80.0 - 100.0 fL    MCH 26.9 26.0 - 34.0 pg    MCHC 30.1 (L) 31.0 - 37.0 g/dL    RDW 60.6 (H) 30.1 - 15.0 %    MPV 14.8 (H) 7.0 - 10.0 fL    Platelet 38 (L) 150 - 440 10*9/L   PT-INR    Collection Time: 06/26/19  6:23 AM   Result Value Ref Range    PT 21.0 (H) 10.2 - 13.1 sec    INR 1.80 Comprehensive Metabolic Panel    Collection Time: 06/26/19  6:23 AM   Result Value Ref Range    Sodium 136 135 - 145 mmol/L    Potassium 3.9 3.5 - 5.0 mmol/L    Chloride 107 98 - 107 mmol/L    Anion Gap 6 (L) 7 - 15 mmol/L  CO2 23.0 22.0 - 30.0 mmol/L    BUN 28 (H) 7 - 21 mg/dL    Creatinine 1.61 (H) 0.60 - 1.00 mg/dL    BUN/Creatinine Ratio 27     EGFR CKD-EPI Non-African American, Female 57 (L) >=60 mL/min/1.25m2    EGFR CKD-EPI African American, Female 4 >=60 mL/min/1.47m2    Glucose 123 70 - 179 mg/dL    Calcium 8.5 8.5 - 09.6 mg/dL    Albumin 3.0 (L) 3.5 - 5.0 g/dL    Total Protein 5.2 (L) 6.5 - 8.3 g/dL    Total Bilirubin 2.2 (H) 0.0 - 1.2 mg/dL    AST 24 14 - 38 U/L    ALT 14 <35 U/L    Alkaline Phosphatase 63 38 - 126 U/L   POCT Glucose    Collection Time: 06/26/19  7:11 AM   Result Value Ref Range    Glucose, POC 144 70 - 179 mg/dL     Labs/Studies:  Labs and Studies from the last 24hrs per EMR and Reviewed and   All lab results last 24 hours:    Recent Results (from the past 24 hour(s))   POCT Glucose    Collection Time: 06/25/19 11:53 AM   Result Value Ref Range    Glucose, POC 129 70 - 179 mg/dL   POCT Glucose    Collection Time: 06/25/19  4:55 PM   Result Value Ref Range    Glucose, POC 159 70 - 179 mg/dL   Prepare RBC    Collection Time: 06/25/19  7:01 PM   Result Value Ref Range    Crossmatch Compatible     Unit Blood Type O Neg     ISBT Number 9500     Unit # [id number]     Status Ready     Spec Expiration [id number]     Product ID Red Blood Cells     PRODUCT CODE [id number]     Crossmatch Compatible     Unit Blood Type O Neg     ISBT Number 9500     Unit # [id number]     Status Transfused     Product ID Red Blood Cells     PRODUCT CODE [id number]    POCT Glucose    Collection Time: 06/25/19  8:23 PM   Result Value Ref Range    Glucose, POC 173 70 - 179 mg/dL   Magnesium Level    Collection Time: 06/26/19  6:23 AM   Result Value Ref Range    Magnesium 2.1 1.6 - 2.2 mg/dL   Bilirubin, total Collection Time: 06/26/19  6:23 AM   Result Value Ref Range    Total Bilirubin 2.3 (H) 0.0 - 1.2 mg/dL   CBC    Collection Time: 06/26/19  6:23 AM   Result Value Ref Range    WBC 2.5 (L) 4.5 - 11.0 10*9/L    RBC 2.87 (L) 4.00 - 5.20 10*12/L    HGB 7.7 (L) 12.0 - 16.0 g/dL    HCT 44.0 (L) 10.2 - 46.0 %    MCV 89.5 80.0 - 100.0 fL    MCH 26.9 26.0 - 34.0 pg    MCHC 30.1 (L) 31.0 - 37.0 g/dL    RDW 72.5 (H) 36.6 - 15.0 %    MPV 14.8 (H) 7.0 - 10.0 fL    Platelet 38 (L) 150 - 440 10*9/L   PT-INR    Collection Time: 06/26/19  6:23 AM   Result Value Ref Range  PT 21.0 (H) 10.2 - 13.1 sec    INR 1.80    Comprehensive Metabolic Panel    Collection Time: 06/26/19  6:23 AM   Result Value Ref Range    Sodium 136 135 - 145 mmol/L    Potassium 3.9 3.5 - 5.0 mmol/L    Chloride 107 98 - 107 mmol/L    Anion Gap 6 (L) 7 - 15 mmol/L    CO2 23.0 22.0 - 30.0 mmol/L    BUN 28 (H) 7 - 21 mg/dL    Creatinine 2.95 (H) 0.60 - 1.00 mg/dL    BUN/Creatinine Ratio 27     EGFR CKD-EPI Non-African American, Female 57 (L) >=60 mL/min/1.37m2    EGFR CKD-EPI African American, Female 14 >=60 mL/min/1.80m2    Glucose 123 70 - 179 mg/dL    Calcium 8.5 8.5 - 62.1 mg/dL    Albumin 3.0 (L) 3.5 - 5.0 g/dL    Total Protein 5.2 (L) 6.5 - 8.3 g/dL    Total Bilirubin 2.2 (H) 0.0 - 1.2 mg/dL    AST 24 14 - 38 U/L    ALT 14 <35 U/L    Alkaline Phosphatase 63 38 - 126 U/L   POCT Glucose    Collection Time: 06/26/19  7:11 AM   Result Value Ref Range    Glucose, POC 144 70 - 179 mg/dL       Objective:  Temp:  [36.6 ??C (97.9 ??F)-37.1 ??C (98.8 ??F)] 36.6 ??C (97.9 ??F)  Heart Rate:  [69-71] 71  Resp:  [18] 18  BP: (108-114)/(33-50) 110/38  SpO2:  [94 %-98 %] 94 %    GEN:??ill looking, obese female,??Lying in bed, no acute distress  HEENT: sclera anicteric, moist mucous membranes  CV: RRR, no murmur  RESP: CTAB, normal WOB  ABD: soft, non-tender, non-distended, ??BS normoactive  EXT: 2+ pitting edema to BUE L>R swelling, 3+ BLE pitting edema, moves all the extremities. NEURO:??alert, oriented x 3, no confusion,??no??focal deficit noted.

## 2019-06-26 NOTE — Unmapped (Signed)
Pt resting comfortably in hospital bed throughout shift. I/O's monitored, pt had one BM during shift (three total for 10/7). Bed in low, locked position, bedside table and call bell within reach, nonskid socks on throughout shift. VSS. Pt has remained free of injury. Will report to oncoming shift.     Problem: Adult Inpatient Plan of Care  Goal: Optimal Comfort and Wellbeing  Outcome: Ongoing - Unchanged     Problem: Adult Inpatient Plan of Care  Goal: Readiness for Transition of Care  Outcome: Ongoing - Unchanged     Problem: Self-Care Deficit  Goal: Improved Ability to Complete Activities of Daily Living  Outcome: Ongoing - Unchanged

## 2019-06-26 NOTE — Unmapped (Signed)
Home health has been set up for through the agency listed below. The Home health agency will be contacting you to set up a time for them to come see you in your home within 2 days of your discharge.  If you have not heard from them prior to 06/29/19 or you have any questions about home health, please contact them at the phone number listed below.    Selected Continued Care - Admitted Since 06/13/2019     [facility] Home Care Coordination complete    Service Provider Selected Services Address Phone Fax Patient Preferred    [facility] [address] [place] [address] [phone] ???

## 2019-06-27 LAB — CBC
HEMATOCRIT: 24.2 % — ABNORMAL LOW (ref 36.0–46.0)
HEMOGLOBIN: 7.5 g/dL — ABNORMAL LOW (ref 12.0–16.0)
MEAN CORPUSCULAR HEMOGLOBIN CONC: 31.2 g/dL (ref 31.0–37.0)
MEAN CORPUSCULAR HEMOGLOBIN: 28.2 pg (ref 26.0–34.0)
MEAN CORPUSCULAR VOLUME: 90.5 fL (ref 80.0–100.0)
PLATELET COUNT: 47 10*9/L — ABNORMAL LOW (ref 150–440)
RED CELL DISTRIBUTION WIDTH: 18.2 % — ABNORMAL HIGH (ref 12.0–15.0)
WBC ADJUSTED: 2.1 10*9/L — ABNORMAL LOW (ref 4.5–11.0)

## 2019-06-27 LAB — COMPREHENSIVE METABOLIC PANEL
ALBUMIN: 2.7 g/dL — ABNORMAL LOW (ref 3.5–5.0)
ALKALINE PHOSPHATASE: 65 U/L (ref 38–126)
ANION GAP: 9 mmol/L (ref 7–15)
AST (SGOT): 26 U/L (ref 14–38)
BILIRUBIN TOTAL: 2 mg/dL — ABNORMAL HIGH (ref 0.0–1.2)
BLOOD UREA NITROGEN: 28 mg/dL — ABNORMAL HIGH (ref 7–21)
BUN / CREAT RATIO: 27
CALCIUM: 8.3 mg/dL — ABNORMAL LOW (ref 8.5–10.2)
CHLORIDE: 106 mmol/L (ref 98–107)
CO2: 23 mmol/L (ref 22.0–30.0)
CREATININE: 1.04 mg/dL — ABNORMAL HIGH (ref 0.60–1.00)
EGFR CKD-EPI AA FEMALE: 66 mL/min/{1.73_m2} (ref >=60)
EGFR CKD-EPI NON-AA FEMALE: 57 mL/min/{1.73_m2} — ABNORMAL LOW (ref >=60)
GLUCOSE RANDOM: 122 mg/dL (ref 70–179)
POTASSIUM: 3.7 mmol/L (ref 3.5–5.0)
PROTEIN TOTAL: 5 g/dL — ABNORMAL LOW (ref 6.5–8.3)
SODIUM: 138 mmol/L (ref 135–145)

## 2019-06-27 LAB — ANION GAP: Anion gap 3:SCnc:Pt:Ser/Plas:Qn:: 9

## 2019-06-27 LAB — RED BLOOD CELL COUNT: Lab: 2.68 — ABNORMAL LOW

## 2019-06-27 LAB — INR: Coagulation tissue factor induced.INR:RelTime:Pt:PPP:Qn:Coag: 1.83

## 2019-06-27 LAB — MAGNESIUM: Magnesium:MCnc:Pt:Ser/Plas:Qn:: 2

## 2019-06-27 MED ORDER — BUMETANIDE 2 MG TABLET
ORAL_TABLET | Freq: Two times a day (BID) | ORAL | 0 refills | 30 days | Status: CP
Start: 2019-06-27 — End: 2019-07-27

## 2019-06-27 MED ORDER — SPIRONOLACTONE 100 MG TABLET
ORAL_TABLET | Freq: Every day | ORAL | 0 refills | 30 days | Status: CP
Start: 2019-06-27 — End: 2019-07-27

## 2019-06-27 NOTE — Unmapped (Signed)
Physician Discharge Summary [facility]  [address] [facility]  [address]  [place] [address]  Dept: [phone]  Loc: [phone]     Identifying Information:   Suezette Lafave  1956-01-12  [id number]    Primary Care Physician: Ignatius Specking, MD   Code Status: Full Code    Admit Date: 06/13/2019    Discharge Date: 06/27/2019     Discharge To: Home with Home Health and/or PT/OT    Discharge Service: [facility] -  Hospitalist (MED H TEAM 2)     Discharge Attending Physician: Joesph July, FNP    Discharge Diagnoses:  Principal Problem:    Upper GI bleeding POA: Unknown  Active Problems:    NAFLD (nonalcoholic fatty liver disease) POA: Yes    Cirrhosis (CMS-HCC) POA: Yes    GAVE (gastric antral vascular ectasia) POA: Yes    Chronic blood loss anemia POA: Yes  Resolved Problems:    * No resolved hospital problems. *      Outpatient Provider Follow Up Issues:   - Follow up with GI in the clinic-recheck CBC, CMP within a week  - Re-treatment of GAVE in 4 weeks (EGD Ordered)  - Every 30 day B12, folate while on iron polysaccharide.     Hospital Course:   ??  Hagar Storts??is a 63 y.o.??female??with medical history of nonbloody cirrhosis, type 2 diabetes, depression, hypothyroidism, chronic GI bleeding with iron deficiency anemia presenting with worsening anemia and volume overload.  ??  Upper GI bleeding, anemia: She was admitted after outpatient EGD  9/25 showed GAVE lesions s/p APC as well as some angiectasia in her stomach which was banded.  Patient has had ongoing transfusion dependent anemia despite multiple sessions of APC.  Received 2 U PRBCs this admission (9/25,9/28) and 1 unit of PRBC on 10/6.  Hb improved after transfusion from 6.6 to 7.6 and continued to remain stable 7.5 on discharge. Ferritin level was 10. IV iron was discussed. The patient had developed severe chest pain when given ??ferumoxytol in the past. The decision was made to start iron polysaccharide.  While on this treatment she will need her B12 and folate checked every 30 days to ensure she is not achieving toxic levels.  Antibiotics were not given due to chronic nature of bleed.  ??  Decompensated NASH cirrhosis complicated by portal hypertension, ascites, history of hepatic encephalopathy: Hepatology consulted. Patient is s/p TIPS (06/2018). Had significant fluid overload (weight 30 pounds above baseline) on presentation.  Echo showed dilated RA, moderately dilated RV with normal RVSP. TIPS ultrasound 9/30 concerning for intra-TIPS stenosis but given RV findings on TTE would be high risk for complications with TIPS revision. Per Gi, anasarca more nutritional/hypoalbuminia??related than TIPS dysfunction. Received  escalating doses of diuretics up to 80 mg BID with no significant improvement clinically. Switched to Bumex 2 mg IV BID 10/6, noticed improvement with urine output and LE swelling.  Transition to p.o. Bumex 2 mg twice daily on discharge.  Also received increased dose of spironolactone 100 mg daily (home dose 50 mg daily) and IV Albumin given hypoalbuminemia (1.7) .  Albumin on discharge 2.7.  This is likely due to combination of liver disease and malnutrition.  Discussed with GI, who agreed to discharge home with Bumex 2 mg p.o. twice daily, spironolactone 100 mg daily.  Already has scheduled EGD for retreatment again in 4 weeks, GI will follow-up closely in the clinic.  Patient was seen and evaluated by PT/OT given her deconditioning who recommended 3  times weekly to improve her functional status.  Case management arranged home health PT/OT and nursing for therapy/continued assistance.   ??  Type 2 DM: Discharged on home regimen insulin.  Patient reports, has not had any recent hypoglycemia/low blood sugar level.  Also she has close access to her endocrinologist for any questions/ concerns with BG or insulin dosing.     Depression:  continue home Zoloft  ??  Hypothyroidism: continue home Synthroid??    Procedures:    PR UPPER GI ENDOSCOPY,BIOPSY [43239] (UGI ENDOSCOPY; WITH BIOPSY, SINGLE OR MULTIPLE)  ______________________________________________________________________  Discharge Medications:     Your Medication List      STOP taking these medications    torsemide 20 MG tablet  Commonly known as: DEMADEX        START taking these medications    bumetanide 2 MG tablet  Commonly known as: BUMEX  Take 1 tablet (2 mg total) by mouth Two (2) times a day.        CHANGE how you take these medications    spironolactone 100 MG tablet  Commonly known as: ALDACTONE  Take 1 tablet (100 mg total) by mouth daily.  What changed:   ?? medication strength  ?? how much to take        CONTINUE taking these medications    cholecalciferol 1,000 unit (25 mcg) tablet  Generic drug: cholecalciferol (vitamin D3)  Take 1,000 Units by mouth every other day.     flintstones complete tablet  Generic drug: pediatric multivit-iron-min  Chew 2 tablets daily.     lactulose 10 gram/15 mL solution  Commonly known as: CHRONULAC  Take 20 g by mouth Four (4) times a day.     LANTUS SOLOSTAR U-100 INSULIN 100 unit/mL (3 mL) injection pen  Generic drug: insulin glargine  Inject 0.45 mL (45 Units total) under the skin daily.     levothyroxine 25 MCG tablet  Commonly known as: SYNTHROID  Take 25 mcg by mouth daily.     magnesium oxide 400 mg (241.3 mg magnesium) tablet  Commonly known as: MAG-OX  Take 800 mg by mouth daily. Take for hand and/or leg cramping.     NovoLOG Flexpen U-100 Insulin 100 unit/mL (3 mL) injection pen  Generic drug: insulin ASPART  Use with given sliding scale    CBG 70-120: 0 units  CBG 121-150: 1 unit  CBG 151-200: 2 units  CBG 201-250: 3 units  CBG 251-300: 5 units  CBG 301-350: 7 units  CBG greater than 351: 9 units     OXYGEN-AIR DELIVERY SYSTEMS MISC  Inhale 2 L nightly.     pantoprazole 40 MG tablet  Commonly known as: PROTONIX  Take 40 mg by mouth Two (2) times a day.     pen needle, diabetic 31 gauge x 1/4 Ndle     potassium chloride 20 MEQ CR tablet  Commonly known as: KLOR-CON  Take 20 mEq by mouth daily.     rifAXIMin 550 mg Tab  Commonly known as: XIFAXAN  Take 1 tablet (550 mg total) by mouth Two (2) times a day.     rosuvastatin 5 MG tablet  Commonly known as: CRESTOR  Take 5 mg by mouth every other day.     sertraline 50 MG tablet  Commonly known as: ZOLOFT  Take 1 tablet (50 mg total) by mouth daily. Please take 1/2 tablet (25mg ) daily by mouth for 4 days, then increase to 1 tablet (50mg ) by mouth daily.  sucralfate 100 mg/mL suspension  Commonly known as: CARAFATE  Take 10 mL (1 g total) by mouth Four (4) times a day.            Allergies:  Ferumoxytol, Definity [perflutren lipid microspheres], and Tramadol hcl  ______________________________________________________________________  Pending Test Results (if blank, then none):      Most Recent Labs:  All lab results last 24 hours -   Recent Results (from the past 24 hour(s))   POCT Glucose    Collection Time: 06/26/19  4:52 PM   Result Value Ref Range    Glucose, POC 151 70 - 179 mg/dL   POCT Glucose    Collection Time: 06/26/19  8:04 PM   Result Value Ref Range    Glucose, POC 197 (H) 70 - 179 mg/dL   Magnesium Level    Collection Time: 06/27/19  5:28 AM   Result Value Ref Range    Magnesium 2.0 1.6 - 2.2 mg/dL   CBC    Collection Time: 06/27/19  5:28 AM   Result Value Ref Range    WBC 2.1 (L) 4.5 - 11.0 10*9/L    RBC 2.68 (L) 4.00 - 5.20 10*12/L    HGB 7.5 (L) 12.0 - 16.0 g/dL    HCT 16.1 (L) 09.6 - 46.0 %    MCV 90.5 80.0 - 100.0 fL    MCH 28.2 26.0 - 34.0 pg    MCHC 31.2 31.0 - 37.0 g/dL    RDW 04.5 (H) 40.9 - 15.0 %    MPV 14.2 (H) 7.0 - 10.0 fL    Platelet 47 (L) 150 - 440 10*9/L   PT-INR    Collection Time: 06/27/19  5:28 AM   Result Value Ref Range    PT 21.3 (H) 10.2 - 13.1 sec    INR 1.83    Comprehensive Metabolic Panel    Collection Time: 06/27/19  5:28 AM   Result Value Ref Range    Sodium 138 135 - 145 mmol/L    Potassium 3.7 3.5 - 5.0 mmol/L    Chloride 106 98 - 107 mmol/L    Anion Gap 9 7 - 15 mmol/L CO2 23.0 22.0 - 30.0 mmol/L    BUN 28 (H) 7 - 21 mg/dL    Creatinine 8.11 (H) 0.60 - 1.00 mg/dL    BUN/Creatinine Ratio 27     EGFR CKD-EPI Non-African American, Female 57 (L) >=60 mL/min/1.27m2    EGFR CKD-EPI African American, Female 7 >=60 mL/min/1.82m2    Glucose 122 70 - 179 mg/dL    Calcium 8.3 (L) 8.5 - 10.2 mg/dL    Albumin 2.7 (L) 3.5 - 5.0 g/dL    Total Protein 5.0 (L) 6.5 - 8.3 g/dL    Total Bilirubin 2.0 (H) 0.0 - 1.2 mg/dL    AST 26 14 - 38 U/L    ALT 15 <35 U/L    Alkaline Phosphatase 65 38 - 126 U/L   POCT Glucose    Collection Time: 06/27/19  7:37 AM   Result Value Ref Range    Glucose, POC 134 70 - 179 mg/dL   POCT Glucose    Collection Time: 06/27/19 11:36 AM   Result Value Ref Range    Glucose, POC 192 (H) 70 - 179 mg/dL       Relevant Studies/Radiology (if blank, then none):    ______________________________________________________________________  Discharge Instructions:           Other Instructions     Discharge instructions  You were monitored in the hospital for chronic GI bleeding/volume overload. Had EGD and Received 3 units of packed RBC in total during this admission for low hemoglobin.  Hemoglobin on discharge 7.7.  You were also given IV albumin for low albumin level.  Initially treated with IV Lasix 80 mg twice daily for volume overload, but given minimal response switched to Bumex 2 mg IV twice daily initially.  Noticed improvement with the swelling, weight and urine output.  Transition to oral Bumex 2 mg twice daily.  Also increased home dose spironolactone 100 mg daily from 50 mg.  Recommend taking medications as prescribed.  GI has already arranged follow-up appointment in the clinic also have EGD scheduled for retreatment of GAVE.  We have not made any changes to your insulin dose.  Recommend checking your blood sugar 4 times a day before taking insulin.  Follow-up with endocrine.               Follow Up instructions and Outpatient Referrals     Discharge instructions Ambulatory referral to Home Health      Is this a [facility] [place] or [facility] referral?: No    Physician to follow patient's care: PCP    Disciplines requested:  Nursing  Physical Therapy  Occupational Therapy       Nursing requested: Teaching/skilled observation and assessment    What teaching is needed (new diagnosis? new medications?): Medication reconcilation and education; Vital signs; Disease education and management    Physical Therapy requested:  Home safety evaluation  Evaluate and treat       Occupational Therapy Requested: Evaluate and treat    Requested start of care date: Routine (within 48 hours)          Appointments which have been scheduled for you    Jun 30, 2019  1:45 PM  (Arrive by 1:15 PM)  RETURN 15 with Chirag Lanney Gins, MD  [facility] TRANSPLANT SURGERY [place] [facility]) [address]  [place] [address]  [phone]      Jul 09, 2019  8:30 AM  COVID PRE TEST with [facility] COVID PRE TEST EDEN PROVIDER  [facility] [facility]) [address]  [address]  [place] [address]  [phone]           ______________________________________________________________________  Discharge Day Services:  BP 123/36 Comment: RN notified - Pulse 66  - Temp 36.6 ??C (97.9 ??F) (Oral)  - Resp 18  - Ht 162.6 cm (5' 4)  - Wt 99.2 kg (218 lb 11.1 oz)  - LMP  (LMP Unknown)  - SpO2 98%  - BMI 37.54 kg/m??   Pt seen on the day of discharge and determined appropriate for discharge.    Condition at Discharge: stable    Length of Discharge: I spent greater than 30 mins in the discharge of this patient.

## 2019-06-27 NOTE — Unmapped (Signed)
Patient remains on RA. Due medications administered. Multiple BMs this shift, lactolose held. Tylenol given for pain with improvement. Patient ambulated in room with walker. Will continue POC         Problem: Adult Inpatient Plan of Care  Goal: Plan of Care Review  Outcome: Ongoing - Unchanged  Goal: Patient-Specific Goal (Individualization)  Outcome: Ongoing - Unchanged  Goal: Absence of Hospital-Acquired Illness or Injury  Outcome: Ongoing - Unchanged  Goal: Optimal Comfort and Wellbeing  Outcome: Ongoing - Unchanged  Goal: Readiness for Transition of Care  Outcome: Ongoing - Unchanged  Goal: Rounds/Family Conference  Outcome: Ongoing - Unchanged     Problem: Fall Injury Risk  Goal: Absence of Fall and Fall-Related Injury  Outcome: Ongoing - Unchanged     Problem: Self-Care Deficit  Goal: Improved Ability to Complete Activities of Daily Living  Outcome: Ongoing - Unchanged     Problem: Diabetes Comorbidity  Goal: Blood Glucose Level Within Desired Range  Outcome: Ongoing - Unchanged

## 2019-06-27 NOTE — Unmapped (Signed)
OCCUPATIONAL THERAPY  Evaluation (06/27/19 0851)    Patient Name:  Marilyn French       Medical Record Number: [id number]   Date of Birth: [birth year]  Sex: Female            Assessment  Problem List: Decreased endurance, Increased Edema    Assessment: Georgie Eduardo is a 63 y.o. female with a hx of NAFLD cirrhosis c/b hepatic encephalopathy and esophageal varices s/p TIPS with chronic blood loss anemia, HFpEF, T2DM, who was recently discharged 6/9 from OSH after receiving 3u pRBCs d/t acute blood loss anemia. Returned to OSH the next day due to progressively altered mental status as reported by her husband and was transferred to [facility] for further evaluation and treatment. Pt found o have decompensated NASH cirrhosis c/b portal hypertension, ascites, and chronic UGIB secondary to GAVE, acute on chronic iron deficiency and blood loss anemia. At this time the patient presents close to baseline for ADL and functional mobility. Currently, the patient does not require acute OT services and was encouraged to perform UE/LE HEP and engage in ADL routines while hospitalized to maintain strength and increase activity tolerance.  After review of pt's occupational profile and history, assessment of occupational performance, clinical decision making, and development of POC, pt presents as a moderate??complexity case.    Today's Interventions: Educated pt on compensatory stratagies for ADL 2/2 tremors in B hands. No tremor noted at this time, however, pt states tremors can make it difficult for her to write and eat. Discssed importanceof pacing activity while rebuilding activity tolerance. Encouraged mobility throughout the day and pt was able to perform functional mobilityin the hallway at mod I level due to slow pace an increased time required.      Activity Tolerance During Today's Session  Patient tolerated treatment well    Plan  Planned Frequency of Treatment:  D/C Services for: D/C Services    Post-Discharge Occupational Therapy Recommendations:  OT Post Acute Discharge Recommendations: 3x weekly   OT DME Recommendations: None    GOALS:   Patient and Family Goals: To return home    Prognosis:  Good  Positive Indicators:  PLOF, supportive spouse  Barriers to Discharge: Endurance deficits    Subjective  Patient / Caregiver reports: I'm supposed to go home today    Prior Functional Status Pt reports that PTA she was independent with ADL and mobilty. Pt states she has a PUW but does not use it. Spouse is home with pt to assist as needed. Pt is a retired [employer] fundraiser. She states she ha increased difficulty attending to tasks which has impacted her ability to engage in activities she enjoys, especially reading.    Living Situation  Living Environment: House  Lives With: Spouse(2 small dogs)  Home Living: Two level home, Able to Live on main level with bedroom/bathroom, Built-in shower seat, Grab bars in shower, Raised toilet seat with rails, Stairs to enter with rails(walk in tub)  Rail placement (outside): Bilateral rails  Number of Stairs: 4      Past Medical History:   Diagnosis Date   ??? Cirrhosis (CMS-HCC)    ??? Depression    ??? Diabetes mellitus (CMS-HCC)    ??? Fibromyalgia    ??? GAVE (gastric antral vascular ectasia)    ??? History of transfusion 04/2019    reports frequent blood tranfusions   ??? HTN (hypertension)    ??? Hypercholesterolemia    ??? Hypothyroid    ??? NAFLD (nonalcoholic fatty liver disease)    ???  Osteoporosis     Social History     Tobacco Use   ??? Smoking status: Never Smoker   ??? Smokeless tobacco: Never Used   Substance Use Topics   ??? Alcohol use: No      Past Surgical History:   Procedure Laterality Date   ??? APPENDECTOMY     ??? CHOLECYSTECTOMY     ??? IR TIPS  07/08/2018    IR TIPS 07/08/2018 Soledad Gerlach, MD IMG VIR [facility]V [facility]   ??? PR RIGHT HEART CATH O2 SATURATION & CARDIAC OUTPUT Right 01/18/2018    Procedure: Right Heart Catheterization;  Surgeon: Marlaine Hind, MD;  Location: [facility] CATH;  Service: Cardiology   ??? PR UPPER GI ENDOSCOPY,CTRL BLEED N/A 03/05/2019    Procedure: UGI ENDOSCOPY; WITH CONTROL OF BLEEDING, ANY METHOD;  Surgeon: Andrey Farmer, MD;  [facility]: [facility];  Service: Gastroenterology   ??? PR UPPER GI ENDOSCOPY,CTRL BLEED  04/04/2019    Procedure: UGI ENDOSCOPY; WITH CONTROL OF BLEEDING, ANY METHOD;  Surgeon: Janyth Pupa, MD;  Location: [facility];  Service: Gastroenterology   ??? PR UPPER GI ENDOSCOPY,CTRL BLEED N/A 05/23/2019    Procedure: UGI ENDOSCOPY; WITH CONTROL OF BLEEDING, ANY METHOD;  Surgeon: Janyth Pupa, MD;  Location: [facility];  Service: Gastroenterology   ??? PR UPPER GI ENDOSCOPY,CTRL BLEED N/A 06/13/2019    Procedure: UGI ENDOSCOPY; WITH CONTROL OF BLEEDING, ANY METHOD;  Surgeon: Annie Paras, MD;  Location: [facility];  Service: Gastroenterology   ??? PR UPPER GI ENDOSCOPY,DIAGNOSIS N/A 03/03/2019    Procedure: UGI ENDO, INCLUDE ESOPHAGUS, STOMACH, & DUODENUM &/OR JEJUNUM; DX W/WO COLLECTION SPECIMN, BY BRUSH OR WASH;  Surgeon: Luanne Bras, MD;  Location: [facility];  Service: Gastroenterology   ??? SALPINGOOPHORECTOMY      History reviewed. No pertinent family history.     Ferumoxytol, Definity [perflutren lipid microspheres], and Tramadol hcl     Objective Findings  Current Status Pt received and left sitting EOB with all needs met    Precautions / Restrictions  Falls precautions    Communication Preference  Verbal    Pain  No c/o pain    Equipment / Environment  Patient not wearing mask for full session, Vascular access (PIV, TLC, Port-a-cath, PICC), Telemetry     Cognition   Orientation Level:  Oriented x 4   Arousal/Alertness:  Appropriate responses to stimuli   Attention Span:  Appears intact   Memory:  Decreased short term memory   Following Commands:  Follows one step commands without difficulty   Safety Judgment:  Appropriate for age   Awareness of Errors:  Appropriate for age Problem Solving:  Appropriate for age    Vision / Perception    Vision: Wears glasses all the time    Hand Function  B grp WFL     Skin Inspection  mild edema throughout    ROM / Strength/Coordination  UE ROM/ Strength/ Coordination: B UE WFL   LE ROM/ Strength/ Coordination: B LE WFL     Balance:  Sitting=Good, Standing=Fair + w/o AD    Functional Mobility  Transfer Assistance Needed: No(ambulated to/from bathroom and in hallway with mod I )  Bed Mobility Assistance Needed: No    ADLs  LB Dressing - Needs Assistance: Min assist       Occupational Therapy Session Duration  OT Individual - Duration: 40       I  attest that I have reviewed the above information.  Signed: Celesta Aver, OT  Filed 06/27/2019

## 2019-06-27 NOTE — Unmapped (Addendum)
PHYSICAL THERAPY  Evaluation (06/27/19 1036)     Patient Name:  Marilyn French       Medical Record Number: [id number]   Date of Birth: 05/05/1956  Sex: Female            Treatment Diagnosis: Generalized weakness, decreased endurance    ASSESSMENT  Problem List: Decreased strength, Decreased endurance, Fall Risk, Impaired balance     Assessment : Marilyn French is a 63 y.o. female with a hx of NAFLD cirrhosis c/b hepatic encephalopathy and esophageal varices s/p TIPS with chronic blood loss anemia, HFpEF, T2DM, who was recently discharged 6/9 from OSH after receiving 3u pRBCs d/t acute blood loss anemia. Returned to OSH the next day due to progressively altered mental status as reported by her husband and was transferred to [facility] for further evaluation and treatment. Pt found to have decompensated NASH cirrhosis c/b portal hypertension, ascites, and chronic UGIB secondary to GAVE, acute on chronic iron deficiency and blood loss anemia. Today patient is demonstrating the above listed deficits. Based on her current functional status, recommend 3x/wk PT post-discharge. After a review of the personal factors, comorbidities, clinical presentation, and examination of the number of affected body systems, the patient presents as a moderate complexity case.     Today's Interventions: Evaluation, bed mobility, gait training, stairs assessment. Educated pt regarding PT role and POC, benefits of mobility, safety with mobility, activity pacing, use of walker in her home environment for safety.                          PLAN  Planned Frequency of Treatment:  1-2x per day for: 3-4x week      Planned Interventions: Balance activities, Endurance activities, Education - Patient, Home exercise program, Gait training, Functional mobility, Transfer training, Stair training, Self-care / Home training, Therapeutic exercise, Therapeutic activity    Post-Discharge Physical Therapy Recommendations:  3x weekly    PT DME Recommendations: (rollator)           Goals:   Patient and Family Goals: Not stated    Long Term Goal #1: Pt will score 24/24 on AMPAC in 4 weeks.       SHORT GOAL #1: Sit<>Stand mod indep with LRAD              Time Frame : 1 week  SHORT GOAL #2: Pt will ambulate 400 ft mod indep with LRAD.              Time Frame : 1 week                                                          Prognosis:  Good  Positive Indicators: PLOF, family support  Barriers to Discharge: None    SUBJECTIVE  Patient reports: Pt agreeable for PT.  Current Functional Status: Arrived with pt reclined in bed, left with pt seated EOB, call bell and all needs within reach.     Prior Functional Status: Pt reports that prior to this episode of volume overload, she was ambulating household distances indep with no device. Also indep for most ADLs, husband would assist with LBD occasionally if she was having a day with increased swelling.  Equipment available at home: Standard Dan Humphreys     Past Medical  History:   Diagnosis Date   ??? Cirrhosis (CMS-HCC)    ??? Depression    ??? Diabetes mellitus (CMS-HCC)    ??? Fibromyalgia    ??? GAVE (gastric antral vascular ectasia)    ??? History of transfusion 04/2019    reports frequent blood tranfusions   ??? HTN (hypertension)    ??? Hypercholesterolemia    ??? Hypothyroid    ??? NAFLD (nonalcoholic fatty liver disease)    ??? Osteoporosis     Social History     Tobacco Use   ??? Smoking status: Never Smoker   ??? Smokeless tobacco: Never Used   Substance Use Topics   ??? Alcohol use: No      Past Surgical History:   Procedure Laterality Date   ??? APPENDECTOMY     ??? CHOLECYSTECTOMY     ??? IR TIPS  07/08/2018    IR TIPS 07/08/2018 Soledad Gerlach, MD IMG VIR H&V [facility]   ??? PR RIGHT HEART CATH O2 SATURATION & CARDIAC OUTPUT Right 01/18/2018    Procedure: Right Heart Catheterization;  Surgeon: Marlaine Hind, MD;  Location: [facility] CATH;  Service: Cardiology   ??? PR UPPER GI ENDOSCOPY,CTRL BLEED N/A 03/05/2019    Procedure: UGI ENDOSCOPY; WITH CONTROL OF BLEEDING, ANY METHOD;  Surgeon: Andrey Farmer, MD;  Location: [facility];  Service: Gastroenterology   ??? PR UPPER GI ENDOSCOPY,CTRL BLEED  04/04/2019    Procedure: UGI ENDOSCOPY; WITH CONTROL OF BLEEDING, ANY METHOD;  Surgeon: Janyth Pupa, MD;  [facility]: [facility];  Service: Gastroenterology   ??? PR UPPER GI ENDOSCOPY,CTRL BLEED N/A 05/23/2019    Procedure: UGI ENDOSCOPY; WITH CONTROL OF BLEEDING, ANY METHOD;  Surgeon: Janyth Pupa, MD;  Location: [facility];  Service: Gastroenterology   ??? PR UPPER GI ENDOSCOPY,CTRL BLEED N/A 06/13/2019    Procedure: UGI ENDOSCOPY; WITH CONTROL OF BLEEDING, ANY METHOD;  Surgeon: Annie Paras, MD;  Location: [facility];  Service: Gastroenterology   ??? PR UPPER GI ENDOSCOPY,DIAGNOSIS N/A 03/03/2019    Procedure: UGI ENDO, INCLUDE ESOPHAGUS, STOMACH, & DUODENUM &/OR JEJUNUM; DX W/WO COLLECTION SPECIMN, BY BRUSH OR WASH;  Surgeon: Luanne Bras, MD;  Location: [facility];  Service: Gastroenterology   ??? SALPINGOOPHORECTOMY      History reviewed. No pertinent family history.     Allergies: Ferumoxytol, Definity [perflutren lipid microspheres], and Tramadol hcl                Objective Findings  Precautions / Restrictions  Precautions: Falls precautions  Weight Bearing Status: Non-applicable  Required Braces or Orthoses: Non-applicable    Communication Preference: Verbal   Pain Comments: Pt denied pain.  Medical Tests / Procedures: Reviewed labs, vitals, imaging, orders, and POC.  Equipment / Environment: Vascular access (PIV, TLC, Port-a-cath, PICC), Patient wearing mask for full session    At Rest: HR: 74, SpO2: 96% on RA  With Activity: HR: 84, SpO2: 95% on RA  Orthostatics: Asymptomatic       Living Situation  Living Environment: House  Lives With: Spouse  Home Living: Two level home, Able to Live on main level with bedroom/bathroom, Built-in shower seat, Grab bars in shower, Raised toilet seat with rails, Stairs to enter with rails  Rail placement (outside): Bilateral rails  Number of Stairs: 4     Cognition: Slightly delayed responses, A&Ox3-4, follows commands.     Skin Inspection: Edema present in bilateral LEs  UE ROM: WFL  UE Strength: Grossly 4-/5  LE ROM: WFL  LE Strength: Grossly 4-/5                       Sensation: Reports intermittent numbness/tingling in LEs. Sensation to LT grossly intact in bilateral feet.  Balance: Supervision for static/dynamic sitting. SBA for static/dynamic standing with RW.         Bed Mobility: Supine to sit with SBA.  Transfers: Sit<>stand with SBA and RW. VC for hand placement.   Gait  Level of Assistance: Standby assist, set-up cues, supervision of patient - no hands on  Assistive Device: Front wheel walker  Distance Ambulated (ft): 300 ft  Gait: Slow cadence, able to maintain conversation and perform dynamic head turns with no LOB. VC for upright posture.  Stairs: Pt ascended/descended 2 steps with bilateral railings with CGA.      Endurance: Decreased endurance for continuous mobility. Pt reports mild SOB (rates 3/10) with ambulation and shows increased WOB.    Physical Therapy Session Duration  PT Individual - Duration: 24    Medical Staff Made Aware: RN Zenda Alpers    I attest that I have reviewed the above information.  Signed: Genella Mech, PT  Filed 06/27/2019

## 2019-06-27 NOTE — Unmapped (Signed)
HEPATOLOGY INPATIENT TREATMENT PLAN NOTE     Interval History:     - no acute events, plan for discharge today    Assessment:    66F w/hx of NAFLD cirrhosis (d/b ascites, HE, EV bleeding), s/p TIPS 06/2018??and embolization of mesocaval varices,??w/subsequent worsening HE, also c/b GAVE requiring multiple episodes APC, who presents as direct admission from endoscopy for planned AM capsule endoscopy in s/o chronic GIB.??Capsule showed clinically insignificant angio ectasia in the iluem. No evidence of further bleeding. Hepatology now also following diffuse anasarca/volume overload.   ??  For workup of volume overload in the setting of patient having a TIPS a [place] TIPS was done which showed findings concerning for intra-TIPS stenosis. Of note, the blood flow was hepatofugal through the TIPS and hepatopetal through the portal vein which also indicates possible TIPS malfunction. While this is a possible cause for her volume overload we would expect this to cause more issues with ascites instead of diffuse anasarca like she is presenting with. TTE??done shows elevated RVSP, moderately dilated RV, severe dilation RA with normal RA pressure, normal LV function. Findings indicate that some of volume overload due to heart failure and intervention to option up TIPS would be high risk for complications. Given these findings do not recommend TIPS revision. Another likely contributing factor to her??diffuse anasarca is simply worsening of her low albumin state (albumin 1.7, was high 2s several months ago).??This is due to a combination of her liver disease and malnutrition.??Her diffuse volume overload is also causing bowel wall edema which is contributing to her endoscopic findings of severe GAVE and chronic GI bleeding.??  ??  RECOMMENDATIONS:  ??  #Anasarca  #LE edema  #Hypoalbuminemia  -??continue bumex 2mg  po BID and continue??spironolactone 100mg  daily  - continue 2g Na diet, does not need a fluid restriction  - encourage high protein intake given hypoalbuminemia, agree with nutritional supplements  ??  GAVE:  - Carafate QID  - BID PPI  - Re-treatment of GAVE in 4 weeks (EGD Ordered)  ??  Outpatient hepatology follow up with Dr. Ruffin Frederick has been requested.     Thank you for this consult. They patient was Discussed with  Dr. Sherryll Burger. We will sign off at this time. Please page hepatology fellow on call with questions.     Hyman Bible, MD  Gastroenterology & Hepatology Fellow, PGY-4  [facility]

## 2019-06-27 NOTE — Unmapped (Signed)
Pt alert and oriented x4. Denied pain and nausea. Worked with PT and OT. Pt getting d/c today. Will continue to monitor.        Problem: Adult Inpatient Plan of Care  Goal: Plan of Care Review  Outcome: Progressing  Goal: Patient-Specific Goal (Individualization)  Outcome: Progressing  Goal: Absence of Hospital-Acquired Illness or Injury  Outcome: Progressing  Goal: Optimal Comfort and Wellbeing  Outcome: Progressing  Goal: Readiness for Transition of Care  Outcome: Progressing  Goal: Rounds/Family Conference  Outcome: Progressing     Problem: Fall Injury Risk  Goal: Absence of Fall and Fall-Related Injury  Outcome: Progressing     Problem: Self-Care Deficit  Goal: Improved Ability to Complete Activities of Daily Living  Outcome: Progressing     Problem: Diabetes Comorbidity  Goal: Blood Glucose Level Within Desired Range  Outcome: Progressing

## 2019-06-30 ENCOUNTER — Telehealth (INDEPENDENT_AMBULATORY_CARE_PROVIDER_SITE_OTHER): Payer: Self-pay | Admitting: Internal Medicine

## 2019-06-30 ENCOUNTER — Ambulatory Visit: Admit: 2019-06-30 | Discharge: 2019-07-01 | Payer: PRIVATE HEALTH INSURANCE

## 2019-06-30 DIAGNOSIS — Z01818 Encounter for other preprocedural examination: Secondary | ICD-10-CM | POA: Diagnosis not present

## 2019-06-30 DIAGNOSIS — K7469 Other cirrhosis of liver: Secondary | ICD-10-CM | POA: Diagnosis not present

## 2019-06-30 DIAGNOSIS — K760 Fatty (change of) liver, not elsewhere classified: Secondary | ICD-10-CM | POA: Diagnosis not present

## 2019-06-30 NOTE — Telephone Encounter (Signed)
Patient left message wanting to know when she is to have lab work again - also stated on Friday, 10-9 her hemoglobin was 7.5 - she is going to [facility] to see a Psychologist, sport and exercise today

## 2019-06-30 NOTE — Unmapped (Addendum)
Transplant Surgery History and Physical      Assessment/Recommendations:    Marilyn French is a 63 y.o. female seen in consultation at the request of Marilyn Burger Vinie Sill, MD for evaluation of candidacy for transplantation.    I spent 45 minutes with the patient obtaining the above history and physical examination, and greater than 50% of the time was spent counseling and on the substance of the discussion.    Today we discussed liver transplantation going over the surgery to be performed, the hospital course including length of stay, anti-rejection medications and their side effects, results and the cadaveric donor system.    I discussed in detail with Marilyn French the risks and benefits of liver transplantation, including but not limited to: the general anesthetic, monitoring lines, the incision, the hepatectomy, as well as reimplantation of the liver graft and immunosuppressant medications. In regards to the surgical procedure, we noted that it is a major operation performed under general anesthesia with the risks of heart attack, stroke and death. Multiple invasive means of monitoring may be necessary during the operation including an arterial line, a central venous catheter, a foley catheter inserted into the bladder, and a tube from your nose into your stomach to prevent stomach distension. After surgery, the patient will go to the Intensive Care Unit and is then sent to the regular floor when medically stable. I discussed the possible complications including the need for reoperation for bleeding, infection or other complications, the possibility of clotting/leakage of blood vessels, requiring either radiological intervention, surgical intervention, or even retransplantation. I reviewed the possibility of complications involving the biliary tract including leaks, strictures and need for retransplantation for biliary complications. I discussed the possibility of primary nonfunction of the liver graft requiring urgent retransplantation or the result of death. The patient understands the need for long-term immunosuppression therapy as well as monitoring of labs and immunosuppression. Anti-rejection medications, including Prograf or Cyclosporine (Neoral), Cellcept, steroids and others, will be needed after transplantation and for the patient???s entire lifetime. Problems include infection, cancer, hirsutism, tremors, gum swelling, hypertension, bone fractures, aggravation of diabetes or new onset diabetes, cataracts, and rashes. Finally, the donor system was reviewed. All donors are tested for infections and other diseases, but there is a small chance of transmission of diseases including viruses as well as the possible transmission of tumors. Some patients may elect to receive a liver from a donor who was exposed to the Hepatitis B or Hepatitis C virus and the recipient may require certain anti-viral medications to prevent this virus from damaging the new liver.    Finally, I reviewed with the patient how the surgery is expected to improve their health and quality of life, that the average length of hospitalization stay is 10-12 days, and that the length of their expected recovery period, including when normal daily activities may be resumed, will be patient dependent.    Marilyn French had all their questions answered and wishes to proceed with the liver transplant evaluation process.    - Patient continues to appear frail and was recently hospitalized for bleeding.   - Meld score 15->16 since last visit  - Patient counseled to increase physical activity and improve diet    This patient was seen and evaluated with Dr. Celine French. She is not an appropriate transplant candidate at this time due to frailty, obesity, h/x portal venous clot, and prior abdominal surgery. Objectively, she is unlikely to tolerate such a large operation.  Will re-evaluate in 3-6 months.  Meanwhile in case she gets admitted with higher MELD score than she is not a good candidate for transplant. She and her husband counseled extensively about it.      HPI  Marilyn French??is a 63 y.o.??female??with NAFLD??cirrhosis s/p TIPS procedure (06/2018) due to??blood loss??secondary??to portal hypertensive gastropathy/GAVE. She was last seen in clinic three months ago. It was recommended that she continue to work on her diet and physical activity. She was recently hospitalized from 9/25-10/9 for GI bleeding and fluid overload. She underwent multiple sessions of APC and banding for GAVE and required numerous blood transfusions. She was transitioned to Bumex for diuresis which she reports has helped her offload fluid. She denies any shortness of breath, abdominal pain, or frank bleeding. She is unsure of whether she has had recent melena. She reports that she continues to feel weak following her hospitalization.      MELD Score: 16    Allergies    Ferumoxytol, Definity [perflutren lipid microspheres], and Tramadol hcl      Medications      Current Outpatient Medications   Medication Sig Dispense Refill   ??? bumetanide (BUMEX) 2 MG tablet Take 1 tablet (2 mg total) by mouth Two (2) times a day. 60 tablet 0   ??? cholecalciferol, vitamin D3, (CHOLECALCIFEROL) 1,000 unit tablet Take 1,000 Units by mouth every other day.      ??? insulin ASPART (NOVOLOG FLEXPEN U-100 INSULIN) 100 unit/mL injection pen Use with given sliding scale    CBG 70-120: 0 units  CBG 121-150: 1 unit  CBG 151-200: 2 units  CBG 201-250: 3 units  CBG 251-300: 5 units  CBG 301-350: 7 units  CBG greater than 351: 9 units     ??? insulin glargine (LANTUS SOLOSTAR U-100 INSULIN) 100 unit/mL (3 mL) injection pen Inject 0.45 mL (45 Units total) under the skin daily. 1350 Units 0   ??? lactulose (CHRONULAC) 10 gram/15 mL solution Take 20 g by mouth Four (4) times a day.      ??? levothyroxine (SYNTHROID, LEVOTHROID) 25 MCG tablet Take 25 mcg by mouth daily.     ??? magnesium oxide (MAG-OX) 400 mg (241.3 mg magnesium) tablet Take 800 mg by mouth daily. Take for hand and/or leg cramping.      ??? OXYGEN-AIR DELIVERY SYSTEMS MISC Inhale 2 L nightly.     ??? pantoprazole (PROTONIX) 40 MG tablet Take 40 mg by mouth Two (2) times a day.      ??? pediatric multivit-iron-min (FLINTSTONES COMPLETE) tablet Chew 2 tablets daily.      ??? pen needle, diabetic 31 gauge x 1/4 Ndle      ??? potassium chloride SA (K-DUR,KLOR-CON) 20 MEQ tablet Take 20 mEq by mouth daily.      ??? rifAXIMin (XIFAXAN) 550 mg Tab Take 1 tablet (550 mg total) by mouth Two (2) times a day. 60 tablet 11   ??? rosuvastatin (CRESTOR) 5 MG tablet Take 5 mg by mouth every other day.     ??? sertraline (ZOLOFT) 50 MG tablet Take 1 tablet (50 mg total) by mouth daily. Please take 1/2 tablet (25mg ) daily by mouth for 4 days, then increase to 1 tablet (50mg ) by mouth daily. 30 tablet 1   ??? spironolactone (ALDACTONE) 100 MG tablet Take 1 tablet (100 mg total) by mouth daily. 30 tablet 0   ??? sucralfate (CARAFATE) 100 mg/mL suspension Take 10 mL (1 g total) by mouth Four (4) times a day. 1200 mL 5  No current facility-administered medications for this visit.     (Not in a hospital admission)        Past Medical History    Past Medical History:   Diagnosis Date   ??? Cirrhosis (CMS-HCC)    ??? Depression    ??? Diabetes mellitus (CMS-HCC)    ??? Fibromyalgia    ??? GAVE (gastric antral vascular ectasia)    ??? History of transfusion 04/2019    reports frequent blood tranfusions   ??? HTN (hypertension)    ??? Hypercholesterolemia    ??? Hypothyroid    ??? NAFLD (nonalcoholic fatty liver disease)    ??? Osteoporosis          Past Surgical History    Past Surgical History:   Procedure Laterality Date   ??? APPENDECTOMY     ??? CHOLECYSTECTOMY     ??? IR TIPS  07/08/2018    IR TIPS 07/08/2018 Soledad Gerlach, MD [facility]   ??? PR RIGHT HEART CATH O2 SATURATION & CARDIAC OUTPUT Right 01/18/2018    Procedure: Right Heart Catheterization;  Surgeon: Marlaine Hind, MD;  Location: [facility] CATH;  Service: Cardiology   ??? PR UPPER GI ENDOSCOPY,CTRL BLEED N/A 03/05/2019    Procedure: UGI ENDOSCOPY; WITH CONTROL OF BLEEDING, ANY METHOD;  Surgeon: Andrey Farmer, MD;  [facility];  Service: Gastroenterology   ??? PR UPPER GI ENDOSCOPY,CTRL BLEED  04/04/2019    Procedure: UGI ENDOSCOPY; WITH CONTROL OF BLEEDING, ANY METHOD;  Surgeon: Janyth Pupa, MD;  [facility]: [facility];  Service: Gastroenterology   ??? PR UPPER GI ENDOSCOPY,CTRL BLEED N/A 05/23/2019    Procedure: UGI ENDOSCOPY; WITH CONTROL OF BLEEDING, ANY METHOD;  Surgeon: Janyth Pupa, MD;  [facility]: [facility];  Service: Gastroenterology   ??? PR UPPER GI ENDOSCOPY,CTRL BLEED N/A 06/13/2019    Procedure: UGI ENDOSCOPY; WITH CONTROL OF BLEEDING, ANY METHOD;  Surgeon: Annie Paras, MD;  [facility]: [facility];  Service: Gastroenterology   ??? PR UPPER GI ENDOSCOPY,DIAGNOSIS N/A 03/03/2019    Procedure: UGI ENDO, INCLUDE ESOPHAGUS, STOMACH, & DUODENUM &/OR JEJUNUM; DX W/WO COLLECTION SPECIMN, BY BRUSH OR WASH;  Surgeon: Luanne Bras, MD;  Location: [facility];  Service: Gastroenterology   ??? SALPINGOOPHORECTOMY           Family History    The patient's family history is not on file..      Social History:    Tobacco use: denies  Alcohol use: denies  Drug use: denies      Review of Systems    A 12 system review of systems was negative except as noted in HPI    Objective     PE: Blood pressure 134/57, pulse 75, temperature 37.1 ??C (98.8 ??F), temperature source Tympanic, height 162.6 cm (5' 4), weight 91.7 kg (202 lb 3.2 oz). Body mass index is 34.71 kg/m??.  General: alert, oriented  Lungs: clear to auscultation, percussion to the bases, and unlabored breathing  Heart: euvolemic, regular rate and rhythm, normal S1 and S2, no murmur  Abd: Obese abdomen, appears mildly distended.   Ascites: mild   Skin: icteric  Ext: edema  Neuro: non-focal exam. thought organized, appropriate affect, normal fluent speech      Test Results    Labs:  All lab results last 24 hours:  No results found for this or any previous visit (from the past 24 hour(s)).  Imaging:     EXAM: CT ABDOMEN PELVIS W CONTRAST  DATE: 03/06/2019 2:31 AM  ACCESSION: [id number]  DICTATED: 03/06/2019 2:45 AM  INTERPRETATION LOCATION: [facility]  ??  CLINICAL INDICATION: Liver transplant workup    ??  COMPARISON: MRI abdomen 02/21/2019.  ??  TECHNIQUE: A spiral CT scan of the abdomen and pelvis was obtained with IV contrast from the lung bases through the pubic symphysis. Images were reconstructed in the axial plane. Coronal and sagittal reformatted images were also provided for further evaluation.  ??  FINDINGS:   ??  LOWER THORAX: Left basilar opacities, likely atelectasis.  ??  HEPATOBILIARY: Cirrhotic liver morphology with nodular hepatic contour and left hepatic hypertrophy. Gallbladder is absent. No biliary dilatation.    SPLEEN: Massive splenomegaly measuring 19.3 cm craniocaudal, similar to prior.  PANCREAS: Unremarkable.  ??  ADRENALS: Unremarkable.  KIDNEYS/URETERS: Mass effect in the left kidney by the enlarged spleen. The kidneys enhance symmetrically. No hydronephrosis bilaterally.  ??  BLADDER: Underdistended, otherwise unremarkable.  PELVIC/REPRODUCTIVE ORGANS: Uterus surgically absent. No adnexal masses.  ??  GI TRACT: The stomach is distended with fluid and debris. No dilated or thick walled loops of bowel. No bowel obstruction.  ??  PERITONEUM/RETROPERITONEUM AND MESENTERY: Mild mesenteric edema. No intracranial free air. Trace ascites.  LYMPH NODES: No enlarged lymph nodes.   VESSELS: The abdominal aorta normal in caliber with mild atherosclerotic calcification. Large retroperitoneal varices are again noted. IVC unremarkable. TIPS stent extending from the right portal vein into the right hepatic vein. Likely chronic near occlusive thrombus noted within the left portal vein (3:21) and main portal vein just proximal to the bifurcation. Paraesophageal and gastrohepatic varices again noted.   ??  BONES AND SOFT TISSUES: Diffuse body wall edema. No acute osseous abnormality.  ??  IMPRESSION:  ??  Cirrhosis with sequela of portal hypertension status post TIPS stent placement. No substantial change in massive splenomegaly, large caliber upper abdominal varices and trace ascites.  ??  Similar appearance of chronic near occlusive thrombus in the left portal vein branches and main portal vein just proximal to the bifurcation.      Chinita Pester MS4    I attest that I have reviewed the student note and that the components of the history of the present illness, the physical exam, and the assessment and plan documented were performed by me or were performed in my presence by the student where I verified the documentation and performed (or re-performed) the exam and medical decision making. Zia Kanner S. Marilyn Mans, MD

## 2019-07-01 ENCOUNTER — Telehealth (INDEPENDENT_AMBULATORY_CARE_PROVIDER_SITE_OTHER): Payer: Self-pay | Admitting: *Deleted

## 2019-07-01 DIAGNOSIS — Z794 Long term (current) use of insulin: Secondary | ICD-10-CM | POA: Diagnosis not present

## 2019-07-01 DIAGNOSIS — G9349 Other encephalopathy: Secondary | ICD-10-CM | POA: Diagnosis not present

## 2019-07-01 DIAGNOSIS — E1165 Type 2 diabetes mellitus with hyperglycemia: Secondary | ICD-10-CM | POA: Diagnosis not present

## 2019-07-01 DIAGNOSIS — M810 Age-related osteoporosis without current pathological fracture: Secondary | ICD-10-CM | POA: Diagnosis not present

## 2019-07-01 DIAGNOSIS — K31811 Angiodysplasia of stomach and duodenum with bleeding: Secondary | ICD-10-CM | POA: Diagnosis not present

## 2019-07-01 DIAGNOSIS — D500 Iron deficiency anemia secondary to blood loss (chronic): Secondary | ICD-10-CM | POA: Diagnosis not present

## 2019-07-01 DIAGNOSIS — K7460 Unspecified cirrhosis of liver: Secondary | ICD-10-CM | POA: Diagnosis not present

## 2019-07-01 DIAGNOSIS — K922 Gastrointestinal hemorrhage, unspecified: Secondary | ICD-10-CM | POA: Diagnosis not present

## 2019-07-01 DIAGNOSIS — K7581 Nonalcoholic steatohepatitis (NASH): Secondary | ICD-10-CM | POA: Diagnosis not present

## 2019-07-01 NOTE — Telephone Encounter (Signed)
[name] from [facility] called and states that after the husband understood the process of getting the results of the labs to be drawn , he wanted to take her to [facility] today and have them done.  Renda Rolls RN is now taking care of the lab orders for [name], I talked with her and she added the requested labs to the order that she had., patient to be at [facility] at 12 noon tomorrow to have labs drawn. Patient's husband was called and made aware.

## 2019-07-01 NOTE — Telephone Encounter (Signed)
Patient's husband called and states that there is a home health nurse there and she can draw labs. Dr.Rehman ask and he states that they may draw CBC, B-Met , INR , Bili. I called the patient's home and spoke with the nurse,Liz and gave her a verbal order for the noted labs. She will either draw today or tomorrow. Her agency will send [place] orders to complete and send back to them.

## 2019-07-02 ENCOUNTER — Encounter (HOSPITAL_COMMUNITY)
Admission: RE | Admit: 2019-07-02 | Discharge: 2019-07-02 | Disposition: A | Discharge status: Home / Self Care | Payer: PPO | Source: Ambulatory Visit | Attending: Internal Medicine | Admitting: Internal Medicine

## 2019-07-02 ENCOUNTER — Other Ambulatory Visit: Payer: Self-pay

## 2019-07-02 DIAGNOSIS — D696 Thrombocytopenia, unspecified: Secondary | ICD-10-CM | POA: Diagnosis not present

## 2019-07-02 DIAGNOSIS — D649 Anemia, unspecified: Secondary | ICD-10-CM | POA: Diagnosis not present

## 2019-07-02 DIAGNOSIS — K7460 Unspecified cirrhosis of liver: Secondary | ICD-10-CM | POA: Diagnosis not present

## 2019-07-02 DIAGNOSIS — Z299 Encounter for prophylactic measures, unspecified: Secondary | ICD-10-CM | POA: Diagnosis not present

## 2019-07-02 DIAGNOSIS — E559 Vitamin D deficiency, unspecified: Secondary | ICD-10-CM | POA: Diagnosis not present

## 2019-07-02 DIAGNOSIS — K5909 Other constipation: Secondary | ICD-10-CM | POA: Diagnosis not present

## 2019-07-02 DIAGNOSIS — I10 Essential (primary) hypertension: Secondary | ICD-10-CM | POA: Diagnosis not present

## 2019-07-02 DIAGNOSIS — K7581 Nonalcoholic steatohepatitis (NASH): Secondary | ICD-10-CM | POA: Diagnosis not present

## 2019-07-02 DIAGNOSIS — Z6835 Body mass index (BMI) 35.0-35.9, adult: Secondary | ICD-10-CM | POA: Diagnosis not present

## 2019-07-02 DIAGNOSIS — Z1211 Encounter for screening for malignant neoplasm of colon: Secondary | ICD-10-CM | POA: Diagnosis not present

## 2019-07-02 LAB — BASIC METABOLIC PANEL
Anion gap: 8 (ref 5–15)
BUN: 22 mg/dL (ref 8–23)
CO2: 22 mmol/L (ref 22–32)
Calcium: 8.4 mg/dL — ABNORMAL LOW (ref 8.9–10.3)
Chloride: 112 mmol/L — ABNORMAL HIGH (ref 98–111)
Creatinine, Ser: 0.94 mg/dL (ref 0.44–1.00)
GFR calc Af Amer: >60 mL/min (ref >60)
GFR calc non Af Amer: >60 mL/min (ref >60)
Glucose, Bld: 138 mg/dL — ABNORMAL HIGH (ref 70–99)
Potassium: 3.4 mmol/L — ABNORMAL LOW (ref 3.5–5.1)
Sodium: 142 mmol/L (ref 135–145)

## 2019-07-02 LAB — CBC WITH DIFFERENTIAL/PLATELET
Abs Immature Granulocytes: 0 10*3/uL (ref 0.00–0.07)
Basophils Absolute: 0 10*3/uL (ref 0.0–0.1)
Basophils Relative: 1 %
Eosinophils Absolute: 0 10*3/uL (ref 0.0–0.5)
Eosinophils Relative: 1 %
HCT: 24.6 % — ABNORMAL LOW (ref 36.0–46.0)
Hemoglobin: 7 g/dL — ABNORMAL LOW (ref 12.0–15.0)
Immature Granulocytes: 0 %
Lymphocytes Relative: 23 %
Lymphs Abs: 0.5 10*3/uL — ABNORMAL LOW (ref 0.7–4.0)
MCH: 26.2 pg (ref 26.0–34.0)
MCHC: 28.5 g/dL — ABNORMAL LOW (ref 30.0–36.0)
MCV: 92.1 fL (ref 80.0–100.0)
Monocytes Absolute: 0.2 10*3/uL (ref 0.1–1.0)
Monocytes Relative: 12 %
Neutro Abs: 1.3 10*3/uL — ABNORMAL LOW (ref 1.7–7.7)
Neutrophils Relative %: 63 %
Platelets: 42 10*3/uL — ABNORMAL LOW (ref 150–400)
RBC: 2.67 MIL/uL — ABNORMAL LOW (ref 3.87–5.11)
RDW: 18.3 % — ABNORMAL HIGH (ref 11.5–15.5)
WBC: 2 10*3/uL — ABNORMAL LOW (ref 4.0–10.5)
nRBC: 0 % (ref 0.0–0.2)

## 2019-07-02 LAB — SAMPLE TO BLOOD BANK

## 2019-07-02 LAB — PROTIME-INR
INR: 1.8 — ABNORMAL HIGH (ref 0.8–1.2)
Prothrombin Time: 20.4 seconds — ABNORMAL HIGH (ref 11.4–15.2)

## 2019-07-02 LAB — PREPARE RBC (CROSSMATCH)

## 2019-07-02 LAB — BILIRUBIN, DIRECT: Bilirubin, Direct: 0.7 mg/dL — ABNORMAL HIGH (ref 0.0–0.2)

## 2019-07-02 MED ORDER — HEPARIN SOD (PORK) LOCK FLUSH 100 UNIT/ML IV SOLN
500.0000 [IU] | Freq: Once | INTRAVENOUS | Status: AC
  Administered 2019-07-02: 500 [IU] via INTRAVENOUS

## 2019-07-02 NOTE — Telephone Encounter (Signed)
Patient was to go to [facility] Short [facility] today at 12 pm to have lab work drawn.

## 2019-07-03 ENCOUNTER — Encounter (HOSPITAL_COMMUNITY)
Admission: RE | Admit: 2019-07-03 | Discharge: 2019-07-03 | Disposition: A | Discharge status: Home / Self Care | Payer: PPO | Source: Ambulatory Visit | Attending: Internal Medicine | Admitting: Internal Medicine

## 2019-07-03 ENCOUNTER — Encounter (HOSPITAL_COMMUNITY): Payer: Self-pay

## 2019-07-03 DIAGNOSIS — K7581 Nonalcoholic steatohepatitis (NASH): Secondary | ICD-10-CM | POA: Diagnosis not present

## 2019-07-03 MED ORDER — DIPHENHYDRAMINE HCL 25 MG PO CAPS
25.0000 mg | ORAL_CAPSULE | Freq: Once | ORAL | Status: AC
  Administered 2019-07-03: 25 mg via ORAL

## 2019-07-03 MED ORDER — ACETAMINOPHEN 325 MG PO TABS
650.0000 mg | ORAL_TABLET | Freq: Once | ORAL | Status: AC
  Administered 2019-07-03: 650 mg via ORAL

## 2019-07-03 MED ORDER — HEPARIN SOD (PORK) LOCK FLUSH 100 UNIT/ML IV SOLN
500.0000 [IU] | Freq: Once | INTRAVENOUS | Status: AC
  Administered 2019-07-03: 500 [IU] via INTRAVENOUS

## 2019-07-03 MED ORDER — SODIUM CHLORIDE 0.9% IV SOLUTION
Freq: Once | INTRAVENOUS | Status: AC
  Administered 2019-07-03: 10:00:00 via INTRAVENOUS

## 2019-07-04 ENCOUNTER — Telehealth (INDEPENDENT_AMBULATORY_CARE_PROVIDER_SITE_OTHER): Payer: Self-pay | Admitting: Internal Medicine

## 2019-07-04 LAB — BPAM RBC
Blood Product Expiration Date: 202011132359
ISSUE DATE / TIME: 202010150926
Unit Type and Rh: 9500

## 2019-07-04 LAB — TYPE AND SCREEN
ABO/RH(D): O NEG
Antibody Screen: NEGATIVE
Unit division: 0

## 2019-07-04 NOTE — Telephone Encounter (Signed)
Patient called (husband) voice message was left. asking that he call back on Monday so that I may get the questions from him that he wants to ask Dr.Rehman.

## 2019-07-04 NOTE — Telephone Encounter (Signed)
Spouse called said he had spoken to [name] on Thursday, 10/15 - has some questions to ask - would like a call back  -  Ph# [phone]

## 2019-07-07 ENCOUNTER — Encounter (HOSPITAL_COMMUNITY)
Admission: RE | Admit: 2019-07-07 | Discharge: 2019-07-07 | Disposition: A | Discharge status: Home / Self Care | Payer: PPO | Source: Ambulatory Visit | Attending: Internal Medicine | Admitting: Internal Medicine

## 2019-07-07 ENCOUNTER — Other Ambulatory Visit: Payer: Self-pay

## 2019-07-07 DIAGNOSIS — K31819 Angiodysplasia of stomach and duodenum without bleeding: Principal | ICD-10-CM

## 2019-07-07 DIAGNOSIS — K7460 Unspecified cirrhosis of liver: Principal | ICD-10-CM

## 2019-07-07 DIAGNOSIS — D500 Iron deficiency anemia secondary to blood loss (chronic): Principal | ICD-10-CM

## 2019-07-07 DIAGNOSIS — K7581 Nonalcoholic steatohepatitis (NASH): Secondary | ICD-10-CM | POA: Diagnosis not present

## 2019-07-07 LAB — HEMOGLOBIN AND HEMATOCRIT, BLOOD
HCT: 27.4 % — ABNORMAL LOW (ref 36.0–46.0)
Hemoglobin: 8.2 g/dL — ABNORMAL LOW (ref 12.0–15.0)

## 2019-07-07 LAB — SAMPLE TO BLOOD BANK

## 2019-07-07 MED ORDER — HEPARIN SOD (PORK) LOCK FLUSH 100 UNIT/ML IV SOLN
500.0000 [IU] | Freq: Once | INTRAVENOUS | Status: AC
  Administered 2019-07-07: 11:00:00 500 [IU] via INTRAVENOUS

## 2019-07-07 NOTE — Progress Notes (Signed)
Patient notified of HGB 8.2. Will see her again Monday the 26th for labs.

## 2019-07-07 NOTE — Unmapped (Signed)
Patient has EGD coming up this Friday. Dr. Ruffin Frederick ordered an CBC this week. I called the patient to discuss this with her. The call went to voicemail. I explained the reason for my call and asked her to call me back.

## 2019-07-08 DIAGNOSIS — Z794 Long term (current) use of insulin: Secondary | ICD-10-CM | POA: Diagnosis not present

## 2019-07-08 DIAGNOSIS — K922 Gastrointestinal hemorrhage, unspecified: Secondary | ICD-10-CM | POA: Diagnosis not present

## 2019-07-08 DIAGNOSIS — D500 Iron deficiency anemia secondary to blood loss (chronic): Secondary | ICD-10-CM | POA: Diagnosis not present

## 2019-07-08 DIAGNOSIS — K31811 Angiodysplasia of stomach and duodenum with bleeding: Secondary | ICD-10-CM | POA: Diagnosis not present

## 2019-07-08 DIAGNOSIS — K7460 Unspecified cirrhosis of liver: Secondary | ICD-10-CM | POA: Diagnosis not present

## 2019-07-08 DIAGNOSIS — K7581 Nonalcoholic steatohepatitis (NASH): Secondary | ICD-10-CM | POA: Diagnosis not present

## 2019-07-08 DIAGNOSIS — E1165 Type 2 diabetes mellitus with hyperglycemia: Secondary | ICD-10-CM | POA: Diagnosis not present

## 2019-07-08 DIAGNOSIS — M810 Age-related osteoporosis without current pathological fracture: Secondary | ICD-10-CM | POA: Diagnosis not present

## 2019-07-08 DIAGNOSIS — G9349 Other encephalopathy: Secondary | ICD-10-CM | POA: Diagnosis not present

## 2019-07-09 ENCOUNTER — Ambulatory Visit: Admit: 2019-07-09 | Discharge: 2019-07-10 | Payer: PRIVATE HEALTH INSURANCE

## 2019-07-09 ENCOUNTER — Encounter: Admit: 2019-07-09 | Discharge: 2019-07-09 | Payer: PRIVATE HEALTH INSURANCE

## 2019-07-09 DIAGNOSIS — K31819 Angiodysplasia of stomach and duodenum without bleeding: Principal | ICD-10-CM

## 2019-07-09 NOTE — Unmapped (Signed)
Patient called me back to say she is planning on getting the lab work done when she comes in for the EGD on Friday.

## 2019-07-09 NOTE — Unmapped (Signed)
Addended by: Randon Goldsmith on: 07/09/2019 08:39 AM     Modules accepted: Orders

## 2019-07-09 NOTE — Unmapped (Signed)
Name:  Marilyn French  DOB: 07-31-1956  Today's Date: 07/09/2019  Age:  63 y.o.    Today's Visit Included:   Indication for Testing: Patient is scheduled for an upcoming procedure, surgery or treatment requiring pre-testing for COVID-19.    Informed the patient that their surgeon will be notified of results in 24-48 hours.     Advised patient to self-isolate and remain in his/her home until the surgery to minimize risk of COVID-19 infection prior to surgery.    Also advised patient to notify his/her surgeon/specialist immediately if the patient develops any new symptoms such as:  []  Subjective fever  []  Chills  []  Muscle aches  []  Runny nose  []  Sore throat  []  Loss of taste or smell   []  Cough (new or worsening of chronic cough)  []  Shortness of breath  []  Diarrhea (3 or more loose stools in the last 24 hours)  []  Fatigue (new or worsening)  []  Abdominal pain  []  Nausea or vomiting  []  Headache     Advised all household members and close contacts to remain in the house as much as possible as well.     COVID-19 testing performed.    Randon Goldsmith, CMA

## 2019-07-10 DIAGNOSIS — K31811 Angiodysplasia of stomach and duodenum with bleeding: Secondary | ICD-10-CM | POA: Diagnosis not present

## 2019-07-10 DIAGNOSIS — D500 Iron deficiency anemia secondary to blood loss (chronic): Secondary | ICD-10-CM | POA: Diagnosis not present

## 2019-07-10 DIAGNOSIS — K7581 Nonalcoholic steatohepatitis (NASH): Secondary | ICD-10-CM | POA: Diagnosis not present

## 2019-07-10 DIAGNOSIS — K922 Gastrointestinal hemorrhage, unspecified: Secondary | ICD-10-CM | POA: Diagnosis not present

## 2019-07-11 ENCOUNTER — Encounter: Admit: 2019-07-11 | Discharge: 2019-07-12 | Payer: PRIVATE HEALTH INSURANCE

## 2019-07-11 ENCOUNTER — Ambulatory Visit: Admit: 2019-07-11 | Discharge: 2019-07-12 | Payer: PRIVATE HEALTH INSURANCE

## 2019-07-11 DIAGNOSIS — I491 Atrial premature depolarization: Secondary | ICD-10-CM | POA: Diagnosis not present

## 2019-07-11 DIAGNOSIS — K31819 Angiodysplasia of stomach and duodenum without bleeding: Secondary | ICD-10-CM | POA: Diagnosis not present

## 2019-07-11 DIAGNOSIS — E039 Hypothyroidism, unspecified: Secondary | ICD-10-CM | POA: Diagnosis not present

## 2019-07-11 DIAGNOSIS — K3189 Other diseases of stomach and duodenum: Secondary | ICD-10-CM | POA: Diagnosis not present

## 2019-07-11 DIAGNOSIS — K7460 Unspecified cirrhosis of liver: Secondary | ICD-10-CM | POA: Diagnosis not present

## 2019-07-11 DIAGNOSIS — D500 Iron deficiency anemia secondary to blood loss (chronic): Secondary | ICD-10-CM | POA: Diagnosis not present

## 2019-07-11 DIAGNOSIS — Z794 Long term (current) use of insulin: Secondary | ICD-10-CM | POA: Diagnosis not present

## 2019-07-11 DIAGNOSIS — K766 Portal hypertension: Secondary | ICD-10-CM | POA: Diagnosis not present

## 2019-07-11 DIAGNOSIS — M810 Age-related osteoporosis without current pathological fracture: Secondary | ICD-10-CM | POA: Diagnosis not present

## 2019-07-11 DIAGNOSIS — K7290 Hepatic failure, unspecified without coma: Secondary | ICD-10-CM | POA: Diagnosis not present

## 2019-07-11 DIAGNOSIS — K760 Fatty (change of) liver, not elsewhere classified: Secondary | ICD-10-CM | POA: Diagnosis not present

## 2019-07-11 DIAGNOSIS — K7581 Nonalcoholic steatohepatitis (NASH): Secondary | ICD-10-CM | POA: Diagnosis not present

## 2019-07-11 DIAGNOSIS — E7800 Pure hypercholesterolemia, unspecified: Secondary | ICD-10-CM | POA: Diagnosis not present

## 2019-07-11 DIAGNOSIS — I8510 Secondary esophageal varices without bleeding: Secondary | ICD-10-CM | POA: Diagnosis not present

## 2019-07-11 DIAGNOSIS — F329 Major depressive disorder, single episode, unspecified: Secondary | ICD-10-CM | POA: Diagnosis not present

## 2019-07-11 DIAGNOSIS — M797 Fibromyalgia: Secondary | ICD-10-CM | POA: Diagnosis not present

## 2019-07-11 DIAGNOSIS — K31811 Angiodysplasia of stomach and duodenum with bleeding: Secondary | ICD-10-CM | POA: Diagnosis not present

## 2019-07-11 DIAGNOSIS — T17908A Unspecified foreign body in respiratory tract, part unspecified causing other injury, initial encounter: Secondary | ICD-10-CM | POA: Diagnosis not present

## 2019-07-11 DIAGNOSIS — Z79899 Other long term (current) drug therapy: Secondary | ICD-10-CM | POA: Diagnosis not present

## 2019-07-11 DIAGNOSIS — R188 Other ascites: Secondary | ICD-10-CM | POA: Diagnosis not present

## 2019-07-11 DIAGNOSIS — E119 Type 2 diabetes mellitus without complications: Secondary | ICD-10-CM | POA: Diagnosis not present

## 2019-07-11 LAB — CBC W/ AUTO DIFF
BASOPHILS ABSOLUTE COUNT: 0 10*9/L (ref 0.0–0.1)
EOSINOPHILS ABSOLUTE COUNT: 0.1 10*9/L (ref 0.0–0.4)
EOSINOPHILS RELATIVE PERCENT: 2.1 %
HEMATOCRIT: 25.7 % — ABNORMAL LOW (ref 36.0–46.0)
LARGE UNSTAINED CELLS: 3 % (ref 0–4)
LYMPHOCYTES ABSOLUTE COUNT: 0.7 10*9/L — ABNORMAL LOW (ref 1.5–5.0)
LYMPHOCYTES RELATIVE PERCENT: 23.1 %
MEAN CORPUSCULAR HEMOGLOBIN CONC: 29.9 g/dL — ABNORMAL LOW (ref 31.0–37.0)
MEAN CORPUSCULAR HEMOGLOBIN: 26.4 pg (ref 26.0–34.0)
MEAN CORPUSCULAR VOLUME: 88.2 fL (ref 80.0–100.0)
MEAN PLATELET VOLUME: 12.7 fL — ABNORMAL HIGH (ref 7.0–10.0)
MONOCYTES ABSOLUTE COUNT: 0.3 10*9/L (ref 0.2–0.8)
MONOCYTES RELATIVE PERCENT: 8.7 %
NEUTROPHILS ABSOLUTE COUNT: 1.9 10*9/L — ABNORMAL LOW (ref 2.0–7.5)
NEUTROPHILS RELATIVE PERCENT: 61.9 %
PLATELET COUNT: 71 10*9/L — ABNORMAL LOW (ref 150–440)
RED BLOOD CELL COUNT: 2.91 10*12/L — ABNORMAL LOW (ref 4.00–5.20)
RED CELL DISTRIBUTION WIDTH: 20.1 % — ABNORMAL HIGH (ref 12.0–15.0)

## 2019-07-11 LAB — COMPREHENSIVE METABOLIC PANEL
ALBUMIN: 2.8 g/dL — ABNORMAL LOW (ref 3.5–5.0)
ALKALINE PHOSPHATASE: 88 U/L (ref 38–126)
ALT (SGPT): 27 U/L (ref <35)
AST (SGOT): 37 U/L (ref 14–38)
BILIRUBIN TOTAL: 4.3 mg/dL — ABNORMAL HIGH (ref 0.0–1.2)
BLOOD UREA NITROGEN: 22 mg/dL — ABNORMAL HIGH (ref 7–21)
CALCIUM: 8.6 mg/dL (ref 8.5–10.2)
CHLORIDE: 103 mmol/L (ref 98–107)
CO2: 26 mmol/L (ref 22.0–30.0)
CREATININE: 1.08 mg/dL — ABNORMAL HIGH (ref 0.60–1.00)
EGFR CKD-EPI AA FEMALE: 63 mL/min/{1.73_m2} (ref >=60)
GLUCOSE RANDOM: 168 mg/dL — ABNORMAL HIGH (ref 70–99)
POTASSIUM: 3.8 mmol/L (ref 3.5–5.0)
PROTEIN TOTAL: 5.5 g/dL — ABNORMAL LOW (ref 6.5–8.3)
SODIUM: 134 mmol/L — ABNORMAL LOW (ref 135–145)

## 2019-07-11 LAB — PROTIME-INR: PROTIME: 20.1 s — ABNORMAL HIGH (ref 10.2–13.1)

## 2019-07-11 LAB — INR: Coagulation tissue factor induced.INR:RelTime:Pt:PPP:Qn:Coag: 1.73

## 2019-07-11 LAB — CALCIUM: Calcium:MCnc:Pt:Ser/Plas:Qn:: 8.6

## 2019-07-11 LAB — SMEAR REVIEW: Lab: 0

## 2019-07-11 LAB — LYMPHOCYTES ABSOLUTE COUNT: Lymphocytes:NCnc:Pt:Bld:Qn:Automated count: 0.7 — ABNORMAL LOW

## 2019-07-11 NOTE — Unmapped (Signed)
Fax received from [facility] pharmacy requesting medication refill of Sertraline 50 mg. Rx pended and ready to be signed.

## 2019-07-11 NOTE — Unmapped (Signed)
[facility] Medicine   History and Physical    Assessment/Plan:    Principal Problem:    Aspiration into airway  Active Problems:    NAFLD (nonalcoholic fatty liver disease)    Cirrhosis (CMS-HCC)    GAVE (gastric antral vascular ectasia)    Hypothyroid    Chronic blood loss anemia    Iron deficiency anemia    Type 2 diabetes mellitus without complication, with long-term current use of insulin (CMS-HCC)  Resolved Problems:    * No resolved hospital problems. *      Marilyn French is a 63 y.o. female with PMHx as noted below who presents to [facility] with Aspiration into airway.    Aspiration event: during EGD today  At present, just with a cough and saturations into the low 90s on RA.   - monitor for development of worsening hypoxemia; treat with [place] as needed  - no antibiotics unless develops fever, leukocytosis, hypoxemia, or worsening hemodynamics (though should have a lower threshold given cirrhotic patient)  - PRN guaifenesin  - incentive spirometer  - encourage ongoing airway clearance with cough    Decompensated NASH cirrhosis c/b portal hypertension, ascites, history of hepatic encephalopathy: s/p TIPS (06/2018), MELD typically 14-16  Recent admission for significant fluid overload requiring IV diuresis. TIPS ultrasound 9/30 concerning for possible intra-TIPS stenosis but not able to be intervened upon due to RV findings on TTE. Ultimately did respond and discharged on Bumex 2mg  BID. Weight has improved significantly since discharge, presently 169# (from > 210).   - followup labwork to assess renal function  - pending results; ordered to continue Bumex 2mg  BID (first dose tomorrow) and Spironolactone 100mg  daily  - 2L fluid restricted diet  - continue home lactulose TID; titrate to 3-4 BMs daily  - continue rifaximin BID  ??  Chronic UGIB secondary to GAVE  Chronic iron deficiency and blood loss anemia:   APC and banding performed 9/25 for treatment of GAVE/gastric angiectasias. She required 2U PRBCs last admission with discharge Hgb of 7.5. Ferritin <??20 consistent with iron deficiency anemia. Did not tolerate Feraheme infusion in the past (chest pain/dyspnea). The reaction does not sound like anaphylaxis but occurred twice per patient. Received iron polysaccharide last admission but not on MAR at present. S/p repeat APC/banding during today's (10/23) EGD.   - full liquid diet today; advance to 2g sodium tomorrow  - continue protonix PO 40 BID and carafate QID  - CBC pending today; transfuse for < 7   - continue prenatal MVI x 2 daily   - reassess iron polysaccharide for outpatient use     Type 2 DM: insulin dependent  Last A1c 5.9%. Only required ~ 15U lantus last admission despite home dose of 55U in AM and 10U PM. She reports only taking the evening dose sporadically.  - reduce lantus to 15U QAM, first dose tomorrow  - update A1c  - Lispro SSI  ??  Chronic Nausea:   - PRN Zofran ??    Depression:   - continue home Zoloft and buspar  ??  Hypothyroidism: last TSH normal in June 2020  - continue home Synthroid 25 mcg daily    DVT prophylaxis: SCDs given chronic UGIB pending Hgb    Code Status:  Full Code     Dispo: admit to observation, Med H2    ___________________________________________________________________    Chief Complaint  No chief complaint on file.      HPI:  Marilyn French is a 63 y.o. female  with decompensated NASH cirrhosis c/b hepatic encephalopathy, portal hypertension with esophageal varices s/p TIPS, GAVE with iron deficiency anemia, ascites, insulin dependent DM type 2 (last A1c 6.5%), and recent admission for decompensated cirrhosis with volume overload requiring IV diuresis as well as acute on chronic anemia who is being admitted for observation following aspiration of gastric/bilious contents during scheduled EGD for treatment of PHG/GAVE.     Patient known to me from recent admission (09/25 - 10/09). She was initially admitted for management of acute on chronic iron deficiency anemia due to GAVE requiring transfusion x 2. Her stay was subsequently complicated by diuretic resistant volume overload necessitating > 1 week of IV diuresis. She was ultimately transitioned to Bumex 2mg  BID by time of discharge and has continued to do well on this dose. She reports weight loss from ~ 210# on discharge to 170#. Her weight has been stable for the past few days around 170. She denies feeling dizzy, lightheaded, or otherwise unwell. She had an episode of vomiting a few days ago when she took her medications on an empty stomach but this has not recurred. She did not notice any blood in her emesis. She presented for her scheduled EGD today as it had been 4 weeks since her last treatment for GAVE. This morning, she did not take any medications except for 1/2 of her glargine dose (typically she takes 55U in the mornings and sometimes 10U at night). She takes lactulose TID with good effect. No blood in stools. No confusion, fevers, chills, cough, headache, sore throat, abdominal pain, chest pain, dyspnea, headache, rash. Does have chronic nausea.     During her procedure, stomach with significant bile and so this was removed. Unfortunately, patient had an episode of emesis despite this and was thought to have aspirated some of the contents. She was intubated by anesthesia and the remainder of the EGD completed as planned (see procedure note for details). She was extubated and has remained stable on room air with oxygen saturations in the low 90s. She has been coughing up clear sputum since the procedure but denies feeling dyspneic at present. Due to her likely aspiration, she is being admitted for observation. Labwork has been drawn and is pending. Patient and husband curious about what to do with her diuretics now that her weight has substantially improved. Patient notes some epigastric pain which is typical for her following this procedure (APC).     Allergies:  Ferumoxytol, Definity [perflutren lipid microspheres], and Tramadol hcl     Medications:   Prior to Admission medications    Medication Dose, Route, Frequency   bumetanide (BUMEX) 2 MG tablet 2 mg, Oral, 2 times a day (standard)   busPIRone (BUSPAR) 10 MG tablet 10 mg, Oral, 2 times a day   cholecalciferol, vitamin D3, (CHOLECALCIFEROL) 1,000 unit tablet 1,000 Units, Oral, Every other day   insulin ASPART (NOVOLOG FLEXPEN U-100 INSULIN) 100 unit/mL injection pen Use with given sliding scale    CBG 70-120: 0 units  CBG 121-150: 1 unit  CBG 151-200: 2 units  CBG 201-250: 3 units  CBG 251-300: 5 units  CBG 301-350: 7 units  CBG greater than 351: 9 units   insulin glargine (LANTUS SOLOSTAR U-100 INSULIN) 100 unit/mL (3 mL) injection pen 45 Units, Subcutaneous, Daily (standard)   lactulose (CHRONULAC) 10 gram/15 mL solution 20 g, Oral, 4 times a day   levothyroxine (SYNTHROID, LEVOTHROID) 25 MCG tablet 25 mcg, Oral, Daily (standard)   lidocaine-prilocaine (EMLA) 2.5-2.5 % cream  No dose, route, or frequency recorded.   magnesium oxide (MAG-OX) 400 mg (241.3 mg magnesium) tablet 800 mg, Oral, Daily (standard), Take for hand and/or leg cramping.   pantoprazole (PROTONIX) 40 MG tablet 40 mg, Oral, 2 times a day (standard)   pediatric multivit-iron-min (FLINTSTONES COMPLETE) tablet 2 tablets, Oral, Daily (standard)   potassium chloride SA (K-DUR,KLOR-CON) 20 MEQ tablet 20 mEq, Oral, Daily (standard)   rifAXIMin (XIFAXAN) 550 mg Tab 550 mg, Oral, 2 times a day (standard)   rosuvastatin (CRESTOR) 5 MG tablet 5 mg, Oral, Every other day   sertraline (ZOLOFT) 50 MG tablet 50 mg, Oral, Daily (standard), Please take 1/2 tablet (25mg ) daily by mouth for 4 days, then increase to 1 tablet (50mg ) by mouth daily.   spironolactone (ALDACTONE) 100 MG tablet 100 mg, Oral, Daily (standard)   sucralfate (CARAFATE) 100 mg/mL suspension 1 g, Oral, 4 times a day   OXYGEN-AIR DELIVERY SYSTEMS MISC 2 L, Inhalation, Nightly   pen needle, diabetic 31 gauge x 1/4 Ndle No dose, route, or frequency recorded.       Medical History:  Past Medical History:   Diagnosis Date   ??? Cirrhosis (CMS-HCC)    ??? Depression    ??? Diabetes mellitus (CMS-HCC)    ??? Fibromyalgia    ??? GAVE (gastric antral vascular ectasia)    ??? History of transfusion 04/2019    reports frequent blood tranfusions   ??? HTN (hypertension)    ??? Hypercholesterolemia    ??? Hypothyroid    ??? NAFLD (nonalcoholic fatty liver disease)    ??? Osteoporosis        Surgical History:  Past Surgical History:   Procedure Laterality Date   ??? APPENDECTOMY     ??? CHOLECYSTECTOMY     ??? IR TIPS  07/08/2018    IR TIPS 07/08/2018 Soledad Gerlach, MD IMG VIR H[facility]V [facility]   ??? PR RIGHT HEART CATH O2 SATURATION & CARDIAC OUTPUT Right 01/18/2018    Procedure: Right Heart Catheterization;  Surgeon: Marlaine Hind, MD;  Location: [facility] CATH;  Service: Cardiology   ??? PR UPPER GI ENDOSCOPY,CTRL BLEED N/A 03/05/2019    Procedure: UGI ENDOSCOPY; WITH CONTROL OF BLEEDING, ANY METHOD;  Surgeon: Andrey Farmer, MD;  Location: GI [facility]URES [facility];  Service: Gastroenterology   ??? PR UPPER GI ENDOSCOPY,CTRL BLEED  04/04/2019    Procedure: UGI ENDOSCOPY; WITH CONTROL OF BLEEDING, ANY METHOD;  Surgeon: Janyth Pupa, MD;  Location: GI [facility];  Service: Gastroenterology   ??? PR UPPER GI ENDOSCOPY,CTRL BLEED N/A 05/23/2019    Procedure: UGI ENDOSCOPY; WITH CONTROL OF BLEEDING, ANY METHOD;  Surgeon: Janyth Pupa, MD;  Location: GI [facility]URES [facility];  Service: Gastroenterology   ??? PR UPPER GI ENDOSCOPY,CTRL BLEED N/A 06/13/2019    Procedure: UGI ENDOSCOPY; WITH CONTROL OF BLEEDING, ANY METHOD;  Surgeon: Annie Paras, MD;  Location: GI [facility];  Service: Gastroenterology   ??? PR UPPER GI ENDOSCOPY,DIAGNOSIS N/A 03/03/2019    Procedure: UGI ENDO, INCLUDE ESOPHAGUS, STOMACH, & DUODENUM &/OR JEJUNUM; DX W/WO COLLECTION SPECIMN, BY BRUSH OR WASH;  Surgeon: Luanne Bras, MD;  Location: GI [facility];  Service: Gastroenterology   ??? SALPINGOOPHORECTOMY         Social History:  Social History     Social History Narrative   ??? Not on file     Social History     Tobacco Use   ??? Smoking status: Never Smoker   ???  Smokeless tobacco: Never Used   Substance Use Topics   ??? Alcohol use: No   ??? Drug use: No       Family History:  History reviewed. No FH of cirrhosis.     Review of Systems:  10 systems reviewed and are negative unless otherwise mentioned in HPI      Physical Exam:  Temp:  [36.1 ??C (96.9 ??F)-36.4 ??C (97.6 ??F)] 36.4 ??C (97.6 ??F)  Heart Rate:  [78-84] 78  Resp:  [17-18] 18  BP: (108-109)/(48-50) 108/50  SpO2:  [97 %-99 %] 99 %  Body mass index is 29.01 kg/m??.    GEN: chronically ill appearing, fatigued, slightly jaundiced caucasian female, alert, appropriate, coughing intermittently, NAD  EYES: EOMI, PERRL, + scleral icterus    ENT: MMM, OP clear   Neck: no JVD  CV: normal rate, regular rhythm, no murmurs   PULM: CTAB, good air entry, normal work of breathing; cough with inspiration  ABD: soft, +BS, NT/ND  EXT: warm, no edema, symmetric pulses  SKIN: no rashes  NEURO: No asterixis, moving all limbs  PSYCH: A+Ox3, appropriate affect  MSK: No deformities or spinal tenderness      Test Results:  Data Review:  Lab results are pending.    Imaging: None    EKG: none    EGD procedure note reviewed. Details of procedure reviewed by phone with Dr. Piedad Climes from GI

## 2019-07-12 DIAGNOSIS — K31819 Angiodysplasia of stomach and duodenum without bleeding: Secondary | ICD-10-CM | POA: Diagnosis not present

## 2019-07-12 DIAGNOSIS — K760 Fatty (change of) liver, not elsewhere classified: Secondary | ICD-10-CM | POA: Diagnosis not present

## 2019-07-12 DIAGNOSIS — D500 Iron deficiency anemia secondary to blood loss (chronic): Secondary | ICD-10-CM | POA: Diagnosis not present

## 2019-07-12 DIAGNOSIS — T17908A Unspecified foreign body in respiratory tract, part unspecified causing other injury, initial encounter: Secondary | ICD-10-CM | POA: Diagnosis not present

## 2019-07-12 LAB — PROTIME: Coagulation tissue factor induced:Time:Pt:PPP:Qn:Coag: 20.2 — ABNORMAL HIGH

## 2019-07-12 LAB — COMPREHENSIVE METABOLIC PANEL
ALBUMIN: 2.7 g/dL — ABNORMAL LOW (ref 3.5–5.0)
ALKALINE PHOSPHATASE: 81 U/L (ref 38–126)
ALT (SGPT): 26 U/L (ref <35)
ANION GAP: 3 mmol/L — ABNORMAL LOW (ref 7–15)
AST (SGOT): 33 U/L (ref 14–38)
BILIRUBIN TOTAL: 4 mg/dL — ABNORMAL HIGH (ref 0.0–1.2)
BLOOD UREA NITROGEN: 21 mg/dL (ref 7–21)
BUN / CREAT RATIO: 21
CALCIUM: 8.5 mg/dL (ref 8.5–10.2)
CHLORIDE: 100 mmol/L (ref 98–107)
CO2: 26 mmol/L (ref 22.0–30.0)
CREATININE: 0.99 mg/dL (ref 0.60–1.00)
EGFR CKD-EPI AA FEMALE: 70 mL/min/{1.73_m2} (ref >=60)
EGFR CKD-EPI NON-AA FEMALE: 60 mL/min/{1.73_m2} (ref >=60)
GLUCOSE RANDOM: 181 mg/dL — ABNORMAL HIGH (ref 70–179)
POTASSIUM: 3.7 mmol/L (ref 3.5–5.0)
PROTEIN TOTAL: 5.3 g/dL — ABNORMAL LOW (ref 6.5–8.3)

## 2019-07-12 LAB — ESTIMATED AVERAGE GLUCOSE: Estimated average glucose:MCnc:Pt:Bld:Qn:Estimated from glycated hemoglobin: 88

## 2019-07-12 LAB — CBC
HEMATOCRIT: 24.8 % — ABNORMAL LOW (ref 36.0–46.0)
HEMOGLOBIN: 7.6 g/dL — ABNORMAL LOW (ref 12.0–16.0)
MEAN CORPUSCULAR HEMOGLOBIN: 26.8 pg (ref 26.0–34.0)
MEAN CORPUSCULAR VOLUME: 87.3 fL (ref 80.0–100.0)
MEAN PLATELET VOLUME: 12 fL — ABNORMAL HIGH (ref 7.0–10.0)
PLATELET COUNT: 61 10*9/L — ABNORMAL LOW (ref 150–440)
RED BLOOD CELL COUNT: 2.85 10*12/L — ABNORMAL LOW (ref 4.00–5.20)
RED CELL DISTRIBUTION WIDTH: 20.5 % — ABNORMAL HIGH (ref 12.0–15.0)
WBC ADJUSTED: 8.4 10*9/L (ref 4.5–11.0)

## 2019-07-12 LAB — HEMOGLOBIN A1C: HEMOGLOBIN A1C: 4.7 % — ABNORMAL LOW (ref 4.8–5.6)

## 2019-07-12 LAB — RED BLOOD CELL COUNT: Lab: 2.85 — ABNORMAL LOW

## 2019-07-12 LAB — PROTIME-INR: INR: 1.74

## 2019-07-12 LAB — CHLORIDE: Chloride:SCnc:Pt:Ser/Plas:Qn:: 100

## 2019-07-12 NOTE — Unmapped (Signed)
Marilyn French is a 63 year old female with PMH of NAFLD, hypothyroidism, iron deficiency anemia who was admitted from GI procedures for monitoring after aspiration. She had O2 saturations in the low 90's and cough prompting admission. She was placed on 2L [place]. CXR was clear. She was weaned back to room air with appropriate O2 saturations. She was continued on her home medication regimen.

## 2019-07-12 NOTE — Unmapped (Signed)
Care Management  Initial Transition Planning Assessment              CM to bedside, introduced self and role to patinet. Patient states she lives at home with husband states she gets home health but is unable to state what company, patient appears to have home health pt and ot through [facility]. Patient states at discharge husband to pick her up.     General  Care Manager assessed the patient by : In person interview with patient, Medical record review  Orientation Level: Oriented X4  Who provides care at home?: N/A  Reason for referral: Discharge Planning    Contact/Decision Maker  Extended Emergency Contact Information  Primary Emergency Contact: [facility]  Address: [address]           [place], [place] [address] [place]  Home Phone: [phone]  Mobile Phone: [phone]  Relation: Spouse  Preferred language: ENGLISH  Interpreter needed? No  Secondary Emergency Contact: Danella Sensing  Work Phone: [phone]  Relation: Daughter    Legal Next of Kin / Guardian / POA / Advance Directives       Advance Directive (Medical Treatment)  Does patient have an advance directive covering medical treatment?: Patient has advance directive covering medical treatment, copy not in chart.  Advance directive covering medical treatment not in Chart:: Copy requested from family  Reason patient does not have an advance directive covering medical treatment:: Patient does not wish to complete one at this time.    Health Care Decision Maker [HCDM] (Medical & Mental Health Treatment)  Healthcare Decision Maker: HCDM documented in the HCDM/Contact Info section.  Information offered on HCDM, Medical & Mental Health advance directives:: Patient declined information.  Referral Made: No    Advance Directive (Mental Health Treatment)  Does patient have an advance directive covering mental health treatment?: Patient does not have advance directive covering mental health treatment.  Reason patient does not have an advance directive covering mental health treatment:: HCDM documented in the HCDM/Contact Info section.    Patient Information  Lives with: Spouse/significant other    Type of Residence: Private residence        Location/Detail: [facility]    Support Systems/Concerns: Family Members, Spouse    Responsibilities/Dependents at home?: No    Home Care services in place prior to admission?: Yes  Type of Home Care services in place prior to admission: Home nursing visits, Home PT, Home OT  Current Home Care provider (Name/Phone #): [facility]    Outpatient/Community Resources in place prior to admission: Clinic  Agency detail (Name/Phone #): PCP: Doreen Beam B     Equipment Currently Used at Home: cane, straight  Current HME Agency (Name/Phone #): [facility]    Currently receiving outpatient dialysis?: No       Financial Information       Need for financial assistance?: No       Social Determinants of Health  Social Determinants of Health were addressed in provider documentation.  Please refer to patient history.    Discharge Needs Assessment  Concerns to be Addressed: discharge planning    Clinical Risk Factors: Multiple Diagnoses (Chronic), Functional Limitations    Barriers to taking medications: No    Prior overnight hospital stay or ED visit in last 90 days: Yes    Readmission Within the Last 30 Days: unable to assess         Anticipated Changes Related to Illness: none  Equipment Needed After Discharge: none    Discharge Facility/Level of Care Needs:      Readmission  Risk of Unplanned Readmission Score:  %  Predictive Model Details   No score data available for [facility] Risk of Unplanned Readmission     Readmitted Within the Last 30 Days? (No if blank)        Discharge Plan  Screen findings are: Care Manager reviewed the plan of the patient's care with the Multidisciplinary Team. No discharge planning needs identified at this time. Care Manager will continue to manage plan and monitor patient's progress with the team.    Expected Discharge Date:     Expected Transfer from Critical Care:      Patient and/or family were provided with choice of facilities / services that are available and appropriate to meet post hospital care needs?: N/A       Initial Assessment complete?: Yes

## 2019-07-12 NOTE — Unmapped (Signed)
Patient discharge to home as per MD order, husband picking pt up as per pt. No acute events to report, VSS welll appearing. Deaccessed, good blood return. Scheduled meds given, has meds at home. NO nausea, vomiting or emesis, no PRNS requested . Patient safety mantained      Problem: Adult Inpatient Plan of Care  Goal: Plan of Care Review  Outcome: Progressing  Goal: Patient-Specific Goal (Individualization)  Outcome: Progressing  Goal: Absence of Hospital-Acquired Illness or Injury  Outcome: Progressing  Goal: Optimal Comfort and Wellbeing  Outcome: Progressing  Goal: Readiness for Transition of Care  Outcome: Progressing  Goal: Rounds/Family Conference  Outcome: Progressing

## 2019-07-12 NOTE — Unmapped (Signed)
Physician Discharge Summary [facility]  [facility] OBSERVATION [facility]  [address]  [place] [address]  Dept: [phone]  Loc: [phone]     Identifying Information:   Marilyn French  1956-04-28  [id number]    Primary Care Physician: Marilyn Specking, MD     Code Status: Full Code    Admit Date: 07/11/2019    Discharge Date: 07/12/2019     Discharge To: Home    Discharge Service: [facility] -  Hospitalist (MED H TEAM 3)     Discharge Attending Physician: Kathryne Hitch, MD    Discharge Diagnoses:  Principal Problem:    Aspiration into airway POA: Yes  Active Problems:    NAFLD (nonalcoholic fatty liver disease) POA: Yes    Cirrhosis (CMS-HCC) POA: Yes    GAVE (gastric antral vascular ectasia) POA: Yes    Hypothyroid POA: Yes    Chronic blood loss anemia POA: Yes    Iron deficiency anemia POA: Yes    Type 2 diabetes mellitus without complication, with long-term current use of insulin (CMS-HCC) POA: Not Applicable  Resolved Problems:    * No resolved hospital problems. *      Outpatient Provider Follow Up Issues:   [ ]  Adjust diuretic regimen as needed    Hospital Course:   [name] is a 63 year old female with PMH of NAFLD, hypothyroidism, iron deficiency anemia who was admitted from GI procedures for monitoring after aspiration. She had O2 saturations in the low 90's and cough prompting admission. She was placed on 2L [place]. CXR was clear. She was weaned back to room air with appropriate O2 saturations. She was continued on her home medication regimen.    Procedures:  PR UPPER GI ENDOSCOPY,BIOPSY [43239] (UGI ENDOSCOPY; WITH BIOPSY, SINGLE OR MULTIPLE)  ______________________________________________________________________  Discharge Medications:     Your Medication List      CONTINUE taking these medications    bumetanide 2 MG tablet  Commonly known as: BUMEX  Take 1 tablet (2 mg total) by mouth Two (2) times a day.     busPIRone 10 MG tablet  Commonly known as: BUSPAR  Take 10 mg by mouth two (2) times a day.     cholecalciferol 1,000 unit (25 mcg) tablet  Generic drug: cholecalciferol (vitamin D3)  Take 1,000 Units by mouth every other day.     flintstones complete tablet  Generic drug: pediatric multivit-iron-min  Chew 2 tablets daily.     lactulose 10 gram/15 mL solution  Commonly known as: CHRONULAC  Take 20 g by mouth Four (4) times a day.     LANTUS SOLOSTAR U-100 INSULIN 100 unit/mL (3 mL) injection pen  Generic drug: insulin glargine  Inject 0.45 mL (45 Units total) under the skin daily.     levothyroxine 25 MCG tablet  Commonly known as: SYNTHROID  Take 25 mcg by mouth daily.     lidocaine-prilocaine 2.5-2.5 % cream  Commonly known as: EMLA     magnesium oxide 400 mg (241.3 mg magnesium) tablet  Commonly known as: MAG-OX  Take 800 mg by mouth daily. Take for hand and/or leg cramping.     NovoLOG Flexpen U-100 Insulin 100 unit/mL (3 mL) injection pen  Generic drug: insulin ASPART  Use with given sliding scale    CBG 70-120: 0 units  CBG 121-150: 1 unit  CBG 151-200: 2 units  CBG 201-250: 3 units  CBG 251-300: 5 units  CBG 301-350: 7 units  CBG greater than  351: 9 units     OXYGEN-AIR DELIVERY SYSTEMS MISC  Inhale 2 L nightly.     pantoprazole 40 MG tablet  Commonly known as: PROTONIX  Take 40 mg by mouth Two (2) times a day.     pen needle, diabetic 31 gauge x 1/4 Ndle     potassium chloride 20 MEQ CR tablet  Commonly known as: KLOR-CON  Take 20 mEq by mouth daily.     rifAXIMin 550 mg Tab  Commonly known as: XIFAXAN  Take 1 tablet (550 mg total) by mouth Two (2) times a day.     rosuvastatin 5 MG tablet  Commonly known as: CRESTOR  Take 5 mg by mouth every other day.     sertraline 50 MG tablet  Commonly known as: ZOLOFT  Take 1 tablet (50 mg total) by mouth daily. Please take 1/2 tablet (25mg ) daily by mouth for 4 days, then increase to 1 tablet (50mg ) by mouth daily.     spironolactone 100 MG tablet  Commonly known as: ALDACTONE  Take 1 tablet (100 mg total) by mouth daily.     sucralfate 100 mg/mL suspension  Commonly known as: CARAFATE  Take 10 mL (1 g total) by mouth Four (4) times a day.            Allergies:  Ferumoxytol, Definity [perflutren lipid microspheres], and Tramadol hcl  ______________________________________________________________________  Pending Test Results (if blank, then none):  None    Most Recent Labs:  All lab results last 24 hours -   Recent Results (from the past 24 hour(s))   Comprehensive Metabolic Panel    Collection Time: 07/11/19  4:15 PM   Result Value Ref Range    Sodium 134 (L) 135 - 145 mmol/L    Potassium 3.8 3.5 - 5.0 mmol/L    Chloride 103 98 - 107 mmol/L    Anion Gap 5 (L) 7 - 15 mmol/L    CO2 26.0 22.0 - 30.0 mmol/L    BUN 22 (H) 7 - 21 mg/dL    Creatinine 2.95 (H) 0.60 - 1.00 mg/dL    BUN/Creatinine Ratio 20     EGFR CKD-EPI Non-African American, Female 54 (L) >=60 mL/min/1.68m2    EGFR CKD-EPI African American, Female 74 >=60 mL/min/1.55m2    Glucose 168 (H) 70 - 99 mg/dL    Calcium 8.6 8.5 - 62.1 mg/dL    Albumin 2.8 (L) 3.5 - 5.0 g/dL    Total Protein 5.5 (L) 6.5 - 8.3 g/dL    Total Bilirubin 4.3 (H) 0.0 - 1.2 mg/dL    AST 37 14 - 38 U/L    ALT 27 <35 U/L    Alkaline Phosphatase 88 38 - 126 U/L   PT-INR    Collection Time: 07/11/19  4:15 PM   Result Value Ref Range    PT 20.1 (H) 10.2 - 13.1 sec    INR 1.73    CBC w/ Differential    Collection Time: 07/11/19  4:15 PM   Result Value Ref Range    WBC 3.1 (L) 4.5 - 11.0 10*9/L    RBC 2.91 (L) 4.00 - 5.20 10*12/L    HGB 7.7 (L) 12.0 - 16.0 g/dL    HCT 30.8 (L) 65.7 - 46.0 %    MCV 88.2 80.0 - 100.0 fL    MCH 26.4 26.0 - 34.0 pg    MCHC 29.9 (L) 31.0 - 37.0 g/dL    RDW 84.6 (H) 96.2 - 15.0 %  MPV 12.7 (H) 7.0 - 10.0 fL    Platelet 71 (L) 150 - 440 10*9/L    Variable HGB Concentration Marked (A) Not Present    Neutrophils % 61.9 %    Lymphocytes % 23.1 %    Monocytes % 8.7 %    Eosinophils % 2.1 %    Basophils % 0.8 %    Absolute Neutrophils 1.9 (L) 2.0 - 7.5 10*9/L    Absolute Lymphocytes 0.7 (L) 1.5 - 5.0 10*9/L Absolute Monocytes 0.3 0.2 - 0.8 10*9/L    Absolute Eosinophils 0.1 0.0 - 0.4 10*9/L    Absolute Basophils 0.0 0.0 - 0.1 10*9/L    Large Unstained Cells 3 0 - 4 %    Microcytosis Slight (A) Not Present    Macrocytosis Slight (A) Not Present    Anisocytosis Moderate (A) Not Present    Hypochromasia Marked (A) Not Present   Morphology Review    Collection Time: 07/11/19  4:15 PM   Result Value Ref Range    Smear Review Comments     POCT Glucose    Collection Time: 07/11/19  6:02 PM   Result Value Ref Range    Glucose, POC 176 70 - 179 mg/dL   POCT Glucose    Collection Time: 07/11/19 10:01 PM   Result Value Ref Range    Glucose, POC 203 (H) 70 - 179 mg/dL   CBC    Collection Time: 07/12/19  4:20 AM   Result Value Ref Range    WBC 8.4 4.5 - 11.0 10*9/L    RBC 2.85 (L) 4.00 - 5.20 10*12/L    HGB 7.6 (L) 12.0 - 16.0 g/dL    HCT 16.1 (L) 09.6 - 46.0 %    MCV 87.3 80.0 - 100.0 fL    MCH 26.8 26.0 - 34.0 pg    MCHC 30.7 (L) 31.0 - 37.0 g/dL    RDW 04.5 (H) 40.9 - 15.0 %    MPV 12.0 (H) 7.0 - 10.0 fL    Platelet 61 (L) 150 - 440 10*9/L   Comprehensive Metabolic Panel    Collection Time: 07/12/19  4:20 AM   Result Value Ref Range    Sodium 129 (L) 135 - 145 mmol/L    Potassium 3.7 3.5 - 5.0 mmol/L    Chloride 100 98 - 107 mmol/L    Anion Gap 3 (L) 7 - 15 mmol/L    CO2 26.0 22.0 - 30.0 mmol/L    BUN 21 7 - 21 mg/dL    Creatinine 8.11 9.14 - 1.00 mg/dL    BUN/Creatinine Ratio 21     EGFR CKD-EPI Non-African American, Female 60 >=60 mL/min/1.27m2    EGFR CKD-EPI African American, Female 70 >=60 mL/min/1.41m2    Glucose 181 (H) 70 - 179 mg/dL    Calcium 8.5 8.5 - 78.2 mg/dL    Albumin 2.7 (L) 3.5 - 5.0 g/dL    Total Protein 5.3 (L) 6.5 - 8.3 g/dL    Total Bilirubin 4.0 (H) 0.0 - 1.2 mg/dL    AST 33 14 - 38 U/L    ALT 26 <35 U/L    Alkaline Phosphatase 81 38 - 126 U/L   Hemoglobin A1c    Collection Time: 07/12/19  4:20 AM   Result Value Ref Range    Hemoglobin A1C 4.7 (L) 4.8 - 5.6 %    Estimated Average Glucose 88 mg/dL   PT-INR Collection Time: 07/12/19  4:20 AM   Result Value Ref Range  PT 20.2 (H) 10.2 - 13.1 sec    INR 1.74    POCT Glucose    Collection Time: 07/12/19  7:47 AM   Result Value Ref Range    Glucose, POC 173 70 - 179 mg/dL   POCT Glucose    Collection Time: 07/12/19 11:50 AM   Result Value Ref Range    Glucose, POC 244 (H) 70 - 179 mg/dL       Relevant Studies/Radiology (if blank, then none):  Xr Chest Portable    Result Date: 07/11/2019  EXAM: XR CHEST PORTABLE DATE: ACCESSION: [id number] UN DICTATED: 07/11/2019 4:20 PM INTERPRETATION LOCATION: [facility] CLINICAL INDICATION: 63 years old Female with r/o aspiration ; OTHER  COMPARISON: 02/18/2019 TECHNIQUE: Portable Chest Radiograph. FINDINGS: Right chest wall Port-A-Cath with its tip in mid SVC. Lungs hypoinflated. No focal consolidation or pulmonary edema. No pleural effusion or pneumothorax Stable enlarged cardiomediastinal silhouette. Partially imaged stent in right upper quadrant     No acute airspace disease.    ______________________________________________________________________  Discharge Instructions:   Activity Instructions     Activity as tolerated            Diet Instructions     Discharge diet (specify)      Discharge Nutrition Therapy: General          Other Instructions     Call MD for:  difficulty breathing, headache or visual disturbances      Call MD for:  persistent dizziness or light-headedness      Call MD for:  persistent nausea or vomiting      Call MD for:  severe uncontrolled pain      Call MD for:  temperature >38.5 Celsius      Discharge instructions      You were admitted to the hospital for monitoring overnight after your procedure with GI. Your lungs have recovered from the aspiration (vomiting) after the procedure. We haven't changed any of your home medications, so keep taking them as you have been.               Follow Up instructions and Outpatient Referrals     Ambulatory referral to Home Health      Is this a [facility] or [facility] referral?: No    Physician to follow patient's care: PCP    Disciplines requested:  Nursing  Physical Therapy  Occupational Therapy       Nursing requested: Other: (please enter in comments)    Physical Therapy requested:  Home safety evaluation  Strengthening exercises  Evaluate and treat       Occupational Therapy Requested:  Home safety evaluation  Strengthening exercises  Evaluate and treat       Requested start of care date: Routine (within 48 hours)    Do you want ongoing co-management?: No    Care coordination required?: No    Call MD for:  difficulty breathing, headache or visual disturbances      Call MD for:  persistent dizziness or light-headedness      Call MD for:  persistent nausea or vomiting      Call MD for:  severe uncontrolled pain      Call MD for:  temperature >38.5 Celsius      Discharge instructions            Appointments which have been scheduled for you    Aug 12, 2019 10:30 AM  (Arrive by 10:00 AM)  RETURN TELEPHONE with Janyth Pupa,  MD  [facility]) [address]  [place] [address]  [phone]           ______________________________________________________________________  Discharge Day Services:  BP 134/40  - Pulse 83  - Temp 37.1 ??C (98.8 ??F) (Oral)  - Resp 18  - Ht 162.6 cm (5' 4)  - Wt 87.3 kg (192 lb 7.4 oz)  - LMP  (LMP Unknown)  - SpO2 96%  - BMI 33.04 kg/m??   Pt seen on the day of discharge and determined appropriate for discharge.    Condition at Discharge: good    Length of Discharge: I spent greater than 30 mins in the discharge of this patient.

## 2019-07-12 NOTE — Unmapped (Signed)
Patient alert & oriented x 4, able to ambulate on standby assist, vital signs stable, denies any pain or shortness of breath. Tolerating full liquid diet. Blood sugar monitored & insulin given per SSI coverage. Had nausea x 1, zofran po med given & was relieved. Will continue to monitor.   Problem: Adult Inpatient Plan of Care  Goal: Plan of Care Review  07/12/2019 0451 by Jeralene Huff Venturina-Barcarse, RN  Outcome: Progressing  Goal: Patient-Specific Goal (Individualization)  07/12/2019 0451 by Jeralene Huff Venturina-Barcarse, RN  Outcome: Progressing  Goal: Absence of Hospital-Acquired Illness or Injury  07/12/2019 0451 by Anna Genre A Venturina-Barcarse, RN  Outcome: Progressing  Goal: Optimal Comfort and Wellbeing  07/12/2019 0451 by Jeralene Huff Venturina-Barcarse, RN  Outcome: Progressing  Goal: Readiness for Transition of Care  07/12/2019 0451 by Jeralene Huff Venturina-Barcarse, RN  Outcome: Progressing      Goal: Rounds/Family Conference  07/12/2019 0451 by Jeralene Huff Venturina-Barcarse, RN  Outcome: Progressing

## 2019-07-13 NOTE — Unmapped (Addendum)
Home Health Agency   [facility]   Services being provided: RN/PT/OT

## 2019-07-14 ENCOUNTER — Encounter (HOSPITAL_COMMUNITY)
Admission: RE | Admit: 2019-07-14 | Discharge: 2019-07-14 | Disposition: A | Discharge status: Home / Self Care | Payer: PPO | Source: Ambulatory Visit | Attending: Internal Medicine | Admitting: Internal Medicine

## 2019-07-14 ENCOUNTER — Telehealth (INDEPENDENT_AMBULATORY_CARE_PROVIDER_SITE_OTHER): Payer: Self-pay | Admitting: Internal Medicine

## 2019-07-14 ENCOUNTER — Other Ambulatory Visit: Payer: Self-pay

## 2019-07-14 DIAGNOSIS — K7581 Nonalcoholic steatohepatitis (NASH): Secondary | ICD-10-CM | POA: Diagnosis not present

## 2019-07-14 LAB — HEMOGLOBIN AND HEMATOCRIT, BLOOD
HCT: 26.5 % — ABNORMAL LOW (ref 36.0–46.0)
Hemoglobin: 7.7 g/dL — ABNORMAL LOW (ref 12.0–15.0)

## 2019-07-14 LAB — SAMPLE TO BLOOD BANK

## 2019-07-14 MED ORDER — SODIUM CHLORIDE 0.9% FLUSH
10.0000 mL | INTRAVENOUS | Status: AC | PRN
  Administered 2019-07-14: 10 mL

## 2019-07-14 MED ORDER — HEPARIN SOD (PORK) LOCK FLUSH 100 UNIT/ML IV SOLN
500.0000 [IU] | INTRAVENOUS | Status: AC | PRN
  Administered 2019-07-14: 500 [IU]

## 2019-07-14 MED ORDER — HEPARIN SOD (PORK) LOCK FLUSH 100 UNIT/ML IV SOLN
INTRAVENOUS | Status: AC
  Filled 2019-07-14: qty 5

## 2019-07-14 NOTE — Progress Notes (Signed)
Mrs. Maria [name] notified of Hgb 7.7 results. Informed no transfusion needed. Voiced understanding.

## 2019-07-14 NOTE — Progress Notes (Signed)
Blood drawn and sent to lab for results.

## 2019-07-14 NOTE — Progress Notes (Signed)
Hgb 7.7 results called to [name]. No blood transfusion needed. No new orders given.

## 2019-07-14 NOTE — Telephone Encounter (Signed)
Patient called wanted to know when she needs to have lab work done again -

## 2019-07-16 ENCOUNTER — Encounter (HOSPITAL_COMMUNITY): Payer: PPO

## 2019-07-16 MED ORDER — SERTRALINE 50 MG TABLET
ORAL_TABLET | Freq: Every day | ORAL | 1 refills | 30.00000 days | Status: CP
Start: 2019-07-16 — End: ?

## 2019-07-16 NOTE — Telephone Encounter (Signed)
The patient's lab work is now being handled through [employer] [facility] Roll[facility]) She may contact her to find out when her next labs will be done.

## 2019-07-17 ENCOUNTER — Encounter (HOSPITAL_COMMUNITY): Admission: RE | Admit: 2019-07-17 | Payer: PPO | Source: Ambulatory Visit

## 2019-07-17 NOTE — Unmapped (Signed)
Spoke to patient, she will scheduled an appt for further refills. Transferred to appointment line for scheduling.

## 2019-07-17 NOTE — Unmapped (Addendum)
Call placed and spoke with Aurther Loft regarding recent hospitalization and transplant candidacy. Discussed @ great length recent transplant surgery note and current contraindications to transplant (significant frailty). He voices concern that barrier seems insurmountable given chronic low hgb in conjunctions with low MELD etc.     He  continued to voice significant frustration re: multiple MD appt's, seemingly endless barriers to transplant and overall misery his wife is in, I encouraged him to have discussion with wife regarding current goals of her care and ways to best improve her current (poor) quality of life. Assured him I would discuss with Dr. Ruffin Frederick and communicate with Dr. Karilyn Cota if appropriate.       Labs to be drawn Monday @ [facility].

## 2019-07-21 ENCOUNTER — Other Ambulatory Visit: Payer: Self-pay

## 2019-07-21 ENCOUNTER — Encounter (HOSPITAL_COMMUNITY)
Admission: RE | Admit: 2019-07-21 | Discharge: 2019-07-21 | Disposition: A | Discharge status: Home / Self Care | Payer: PPO | Source: Ambulatory Visit | Attending: Internal Medicine | Admitting: Internal Medicine

## 2019-07-21 ENCOUNTER — Encounter (HOSPITAL_COMMUNITY): Payer: PPO

## 2019-07-21 DIAGNOSIS — D509 Iron deficiency anemia, unspecified: Secondary | ICD-10-CM | POA: Diagnosis not present

## 2019-07-21 DIAGNOSIS — E1165 Type 2 diabetes mellitus with hyperglycemia: Secondary | ICD-10-CM | POA: Diagnosis not present

## 2019-07-21 DIAGNOSIS — L281 Prurigo nodularis: Secondary | ICD-10-CM | POA: Diagnosis not present

## 2019-07-21 DIAGNOSIS — L859 Epidermal thickening, unspecified: Secondary | ICD-10-CM | POA: Diagnosis not present

## 2019-07-21 DIAGNOSIS — I10 Essential (primary) hypertension: Secondary | ICD-10-CM | POA: Diagnosis not present

## 2019-07-21 DIAGNOSIS — D485 Neoplasm of uncertain behavior of skin: Secondary | ICD-10-CM | POA: Diagnosis not present

## 2019-07-21 DIAGNOSIS — K7581 Nonalcoholic steatohepatitis (NASH): Secondary | ICD-10-CM | POA: Diagnosis not present

## 2019-07-21 DIAGNOSIS — D649 Anemia, unspecified: Secondary | ICD-10-CM | POA: Diagnosis not present

## 2019-07-21 DIAGNOSIS — Z299 Encounter for prophylactic measures, unspecified: Secondary | ICD-10-CM | POA: Diagnosis not present

## 2019-07-21 LAB — HEMOGLOBIN AND HEMATOCRIT, BLOOD
HCT: 25.2 % — ABNORMAL LOW (ref 36.0–46.0)
Hemoglobin: 7.3 g/dL — ABNORMAL LOW (ref 12.0–15.0)

## 2019-07-21 LAB — SAMPLE TO BLOOD BANK

## 2019-07-21 MED ORDER — HEPARIN SOD (PORK) LOCK FLUSH 100 UNIT/ML IV SOLN
500.0000 [IU] | Freq: Once | INTRAVENOUS | Status: AC
  Administered 2019-07-21: 12:00:00 500 [IU] via INTRAVENOUS

## 2019-07-22 DIAGNOSIS — L281 Prurigo nodularis: Secondary | ICD-10-CM | POA: Diagnosis not present

## 2019-07-22 DIAGNOSIS — L859 Epidermal thickening, unspecified: Secondary | ICD-10-CM | POA: Diagnosis not present

## 2019-07-23 ENCOUNTER — Encounter (HOSPITAL_COMMUNITY): Payer: PPO

## 2019-07-23 ENCOUNTER — Encounter (INDEPENDENT_AMBULATORY_CARE_PROVIDER_SITE_OTHER): Payer: PPO | Admitting: Ophthalmology

## 2019-07-23 DIAGNOSIS — E113312 Type 2 diabetes mellitus with moderate nonproliferative diabetic retinopathy with macular edema, left eye: Secondary | ICD-10-CM | POA: Diagnosis not present

## 2019-07-23 DIAGNOSIS — E113291 Type 2 diabetes mellitus with mild nonproliferative diabetic retinopathy without macular edema, right eye: Secondary | ICD-10-CM

## 2019-07-23 DIAGNOSIS — I10 Essential (primary) hypertension: Secondary | ICD-10-CM

## 2019-07-23 DIAGNOSIS — E11311 Type 2 diabetes mellitus with unspecified diabetic retinopathy with macular edema: Secondary | ICD-10-CM | POA: Diagnosis not present

## 2019-07-23 DIAGNOSIS — H35033 Hypertensive retinopathy, bilateral: Secondary | ICD-10-CM

## 2019-07-23 DIAGNOSIS — H2513 Age-related nuclear cataract, bilateral: Secondary | ICD-10-CM | POA: Diagnosis not present

## 2019-07-23 DIAGNOSIS — H43813 Vitreous degeneration, bilateral: Secondary | ICD-10-CM | POA: Diagnosis not present

## 2019-07-24 ENCOUNTER — Encounter (HOSPITAL_COMMUNITY): Payer: PPO

## 2019-07-24 DIAGNOSIS — Z794 Long term (current) use of insulin: Secondary | ICD-10-CM | POA: Diagnosis not present

## 2019-07-24 DIAGNOSIS — D500 Iron deficiency anemia secondary to blood loss (chronic): Secondary | ICD-10-CM | POA: Diagnosis not present

## 2019-07-24 DIAGNOSIS — G9349 Other encephalopathy: Secondary | ICD-10-CM | POA: Diagnosis not present

## 2019-07-24 DIAGNOSIS — K31811 Angiodysplasia of stomach and duodenum with bleeding: Secondary | ICD-10-CM | POA: Diagnosis not present

## 2019-07-24 DIAGNOSIS — E1165 Type 2 diabetes mellitus with hyperglycemia: Secondary | ICD-10-CM | POA: Diagnosis not present

## 2019-07-24 DIAGNOSIS — K922 Gastrointestinal hemorrhage, unspecified: Secondary | ICD-10-CM | POA: Diagnosis not present

## 2019-07-24 DIAGNOSIS — K7581 Nonalcoholic steatohepatitis (NASH): Secondary | ICD-10-CM | POA: Diagnosis not present

## 2019-07-24 DIAGNOSIS — K7460 Unspecified cirrhosis of liver: Secondary | ICD-10-CM | POA: Diagnosis not present

## 2019-07-24 DIAGNOSIS — M810 Age-related osteoporosis without current pathological fracture: Secondary | ICD-10-CM | POA: Diagnosis not present

## 2019-07-24 LAB — CBC W/ DIFFERENTIAL
HEMATOCRIT: 25.2 %
HEMOGLOBIN: 7.3 g/dL

## 2019-07-24 LAB — ANISOCYTOSIS: Lab: 0

## 2019-07-28 ENCOUNTER — Encounter (HOSPITAL_COMMUNITY)
Admission: RE | Admit: 2019-07-28 | Discharge: 2019-07-28 | Disposition: A | Discharge status: Home / Self Care | Payer: PPO | Source: Ambulatory Visit | Attending: Internal Medicine | Admitting: Internal Medicine

## 2019-07-28 ENCOUNTER — Other Ambulatory Visit: Payer: Self-pay

## 2019-07-28 ENCOUNTER — Encounter (HOSPITAL_COMMUNITY): Payer: PPO

## 2019-07-28 DIAGNOSIS — D509 Iron deficiency anemia, unspecified: Secondary | ICD-10-CM | POA: Diagnosis not present

## 2019-07-28 LAB — SAMPLE TO BLOOD BANK

## 2019-07-28 LAB — PREPARE RBC (CROSSMATCH)

## 2019-07-28 LAB — HEMOGLOBIN AND HEMATOCRIT, BLOOD
HCT: 22.2 % — ABNORMAL LOW (ref 36.0–46.0)
Hemoglobin: 6.3 g/dL — CL (ref 12.0–15.0)

## 2019-07-28 MED ORDER — HEPARIN SOD (PORK) LOCK FLUSH 100 UNIT/ML IV SOLN
500.0000 [IU] | Freq: Once | INTRAVENOUS | Status: AC
  Administered 2019-07-28: 500 [IU] via INTRAVENOUS

## 2019-07-29 ENCOUNTER — Encounter (HOSPITAL_COMMUNITY): Payer: Self-pay

## 2019-07-29 ENCOUNTER — Encounter (HOSPITAL_COMMUNITY)
Admission: RE | Admit: 2019-07-29 | Discharge: 2019-07-29 | Disposition: A | Discharge status: Home / Self Care | Payer: PPO | Source: Ambulatory Visit | Attending: Internal Medicine | Admitting: Internal Medicine

## 2019-07-29 ENCOUNTER — Encounter (INDEPENDENT_AMBULATORY_CARE_PROVIDER_SITE_OTHER): Payer: Self-pay

## 2019-07-29 DIAGNOSIS — D509 Iron deficiency anemia, unspecified: Secondary | ICD-10-CM | POA: Diagnosis not present

## 2019-07-29 LAB — AMMONIA: Ammonia: 65 umol/L — ABNORMAL HIGH (ref 9–35)

## 2019-07-29 MED ORDER — SODIUM CHLORIDE 0.9% IV SOLUTION
Freq: Once | INTRAVENOUS | Status: AC
  Administered 2019-07-29: 11:00:00 via INTRAVENOUS

## 2019-07-29 MED ORDER — HEPARIN SOD (PORK) LOCK FLUSH 100 UNIT/ML IV SOLN
500.0000 [IU] | Freq: Once | INTRAVENOUS | Status: AC
  Administered 2019-07-29: 500 [IU] via INTRAVENOUS

## 2019-07-29 MED ORDER — ACETAMINOPHEN 325 MG PO TABS
650.0000 mg | ORAL_TABLET | Freq: Once | ORAL | Status: AC
  Administered 2019-07-29: 650 mg via ORAL

## 2019-07-29 MED ORDER — DIPHENHYDRAMINE HCL 25 MG PO CAPS
25.0000 mg | ORAL_CAPSULE | Freq: Once | ORAL | Status: AC
  Administered 2019-07-29: 25 mg via ORAL

## 2019-07-30 LAB — TYPE AND SCREEN
ABO/RH(D): O NEG
Antibody Screen: NEGATIVE
Unit division: 0

## 2019-07-30 LAB — BPAM RBC
Blood Product Expiration Date: 202012032359
ISSUE DATE / TIME: 202011101059
Unit Type and Rh: 9500

## 2019-07-31 ENCOUNTER — Encounter (HOSPITAL_COMMUNITY): Payer: PPO

## 2019-08-03 ENCOUNTER — Other Ambulatory Visit (INDEPENDENT_AMBULATORY_CARE_PROVIDER_SITE_OTHER): Payer: Self-pay | Admitting: Internal Medicine

## 2019-08-04 ENCOUNTER — Encounter (HOSPITAL_COMMUNITY)
Admission: RE | Admit: 2019-08-04 | Discharge: 2019-08-04 | Disposition: A | Discharge status: Home / Self Care | Payer: PPO | Source: Ambulatory Visit | Attending: Internal Medicine | Admitting: Internal Medicine

## 2019-08-04 ENCOUNTER — Other Ambulatory Visit: Payer: Self-pay

## 2019-08-04 ENCOUNTER — Encounter (HOSPITAL_COMMUNITY): Payer: PPO

## 2019-08-04 DIAGNOSIS — D509 Iron deficiency anemia, unspecified: Secondary | ICD-10-CM | POA: Diagnosis not present

## 2019-08-04 LAB — HEMOGLOBIN AND HEMATOCRIT, BLOOD
HCT: 24 % — ABNORMAL LOW (ref 36.0–46.0)
Hemoglobin: 6.7 g/dL — CL (ref 12.0–15.0)

## 2019-08-04 LAB — SAMPLE TO BLOOD BANK

## 2019-08-04 LAB — PREPARE RBC (CROSSMATCH)

## 2019-08-04 MED ORDER — HEPARIN SOD (PORK) LOCK FLUSH 100 UNIT/ML IV SOLN
500.0000 [IU] | INTRAVENOUS | Status: AC | PRN
  Administered 2019-08-04: 500 [IU]

## 2019-08-04 MED ORDER — HEPARIN SOD (PORK) LOCK FLUSH 100 UNIT/ML IV SOLN
INTRAVENOUS | Status: AC
  Filled 2019-08-04: qty 5

## 2019-08-04 MED ORDER — SODIUM CHLORIDE 0.9% IV SOLUTION: Freq: Once | INTRAVENOUS | Status: DC

## 2019-08-04 MED ORDER — SODIUM CHLORIDE 0.9% FLUSH
10.0000 mL | INTRAVENOUS | Status: AC | PRN
  Administered 2019-08-04: 13:00:00 10 mL

## 2019-08-05 ENCOUNTER — Encounter (HOSPITAL_COMMUNITY)
Admission: RE | Admit: 2019-08-05 | Discharge: 2019-08-05 | Disposition: A | Discharge status: Home / Self Care | Payer: PPO | Source: Ambulatory Visit | Attending: Internal Medicine | Admitting: Internal Medicine

## 2019-08-05 ENCOUNTER — Encounter (HOSPITAL_COMMUNITY): Payer: Self-pay

## 2019-08-05 DIAGNOSIS — D509 Iron deficiency anemia, unspecified: Secondary | ICD-10-CM | POA: Diagnosis not present

## 2019-08-05 MED ORDER — SODIUM CHLORIDE 0.9% IV SOLUTION
Freq: Once | INTRAVENOUS | Status: AC
  Administered 2019-08-05: 10:00:00 via INTRAVENOUS

## 2019-08-05 MED ORDER — HEPARIN SOD (PORK) LOCK FLUSH 100 UNIT/ML IV SOLN
500.0000 [IU] | Freq: Once | INTRAVENOUS | Status: AC
  Administered 2019-08-05: 500 [IU] via INTRAVENOUS

## 2019-08-05 MED ORDER — ACETAMINOPHEN 325 MG PO TABS
650.0000 mg | ORAL_TABLET | Freq: Once | ORAL | Status: AC
  Administered 2019-08-05: 650 mg via ORAL

## 2019-08-05 MED ORDER — DIPHENHYDRAMINE HCL 25 MG PO CAPS
25.0000 mg | ORAL_CAPSULE | Freq: Once | ORAL | Status: AC
  Administered 2019-08-05: 25 mg via ORAL

## 2019-08-06 DIAGNOSIS — K7290 Hepatic failure, unspecified without coma: Principal | ICD-10-CM

## 2019-08-06 DIAGNOSIS — K31819 Angiodysplasia of stomach and duodenum without bleeding: Principal | ICD-10-CM

## 2019-08-06 LAB — TYPE AND SCREEN
ABO/RH(D): O NEG
Antibody Screen: NEGATIVE
Unit division: 0

## 2019-08-06 LAB — BPAM RBC
Blood Product Expiration Date: 202012162359
ISSUE DATE / TIME: 202011171027
Unit Type and Rh: 9500

## 2019-08-06 NOTE — Unmapped (Signed)
Return call placed and spoke with Hildred Priest. Patient remains symptomatic of her anemia - per previous EGD report repeat in 4 weeks. Order placed for upper endoscopy and requested patient / spouse reach out to me if not contacted by [facility] scheduling by early next week

## 2019-08-07 ENCOUNTER — Encounter (HOSPITAL_COMMUNITY): Payer: PPO

## 2019-08-07 MED ORDER — BUMETANIDE 2 MG TABLET
ORAL_TABLET | Freq: Two times a day (BID) | ORAL | 11 refills | 30.00000 days | Status: CP
Start: 2019-08-07 — End: 2019-09-06

## 2019-08-07 MED ORDER — SPIRONOLACTONE 100 MG TABLET
ORAL_TABLET | Freq: Every day | ORAL | 11 refills | 30 days | Status: CP
Start: 2019-08-07 — End: 2019-09-06

## 2019-08-07 NOTE — Unmapped (Signed)
Return call placed and spoke with [name] and her spouse Casimiro Needle.     Emalia reports need for refill of bumex / aldactone for the past 3 weeks. Made her aware that no requests have been received by me. Advised that if this were to occur in the future to please contact me so that situation can be resolved more quickly. RX sent to local pharmacy.    Spouse then got on the phone - began questioning need for repeat EGD. Referred him to conversation from 11/18 of which he has no recollection. Recounted conversation with him and need to contact me early next week of not scheduled. He again stated understanding.      Mr. Jiles Prows then began recounting much of conversation from 10/29 - attempted to redirect him multiple times unsuccessfully.  Provided education for >25 minutes re: MELD score, frailty, transplant status, EGD, blood transfusion etc. Offered to request change to upcoming appt with Dr. Ruffin Frederick to in person if appropriate per MD.  Will also discuss benefit of adding nutrition to appt (if in person) to perform frailty assessment.

## 2019-08-07 NOTE — Unmapped (Signed)
Received a call from [place] regarding Rx refills.  I made her aware that I would have her coordinator contact her as soon as she could.  Patient stated understanding.

## 2019-08-11 ENCOUNTER — Other Ambulatory Visit: Payer: Self-pay

## 2019-08-11 ENCOUNTER — Encounter (HOSPITAL_COMMUNITY): Payer: PPO

## 2019-08-11 ENCOUNTER — Encounter (HOSPITAL_COMMUNITY)
Admission: RE | Admit: 2019-08-11 | Discharge: 2019-08-11 | Disposition: A | Discharge status: Home / Self Care | Payer: PPO | Source: Ambulatory Visit | Attending: Internal Medicine | Admitting: Internal Medicine

## 2019-08-11 DIAGNOSIS — D509 Iron deficiency anemia, unspecified: Secondary | ICD-10-CM | POA: Diagnosis not present

## 2019-08-11 LAB — SAMPLE TO BLOOD BANK

## 2019-08-11 LAB — HEMOGLOBIN AND HEMATOCRIT, BLOOD
HCT: 26.2 % — ABNORMAL LOW (ref 36.0–46.0)
Hemoglobin: 7.4 g/dL — ABNORMAL LOW (ref 12.0–15.0)

## 2019-08-12 ENCOUNTER — Ambulatory Visit
Admit: 2019-08-12 | Discharge: 2019-08-13 | Payer: PRIVATE HEALTH INSURANCE | Attending: Internal Medicine | Primary: Internal Medicine

## 2019-08-12 DIAGNOSIS — K7460 Unspecified cirrhosis of liver: Principal | ICD-10-CM

## 2019-08-12 LAB — COMPREHENSIVE METABOLIC PANEL
ALBUMIN: 2.8 g/dL — ABNORMAL LOW (ref 3.5–5.0)
ALKALINE PHOSPHATASE: 98 U/L (ref 38–126)
ALT (SGPT): 29 U/L (ref <35)
ANION GAP: 7 mmol/L (ref 7–15)
AST (SGOT): 37 U/L (ref 14–38)
BILIRUBIN TOTAL: 2.6 mg/dL — ABNORMAL HIGH (ref 0.0–1.2)
BLOOD UREA NITROGEN: 28 mg/dL — ABNORMAL HIGH (ref 7–21)
BUN / CREAT RATIO: 21
CALCIUM: 8.7 mg/dL (ref 8.5–10.2)
CHLORIDE: 105 mmol/L (ref 98–107)
CO2: 21 mmol/L — ABNORMAL LOW (ref 22.0–30.0)
CREATININE: 1.36 mg/dL — ABNORMAL HIGH (ref 0.60–1.00)
EGFR CKD-EPI AA FEMALE: 47 mL/min/{1.73_m2} — ABNORMAL LOW (ref >=60)
EGFR CKD-EPI NON-AA FEMALE: 41 mL/min/{1.73_m2} — ABNORMAL LOW (ref >=60)
GLUCOSE RANDOM: 168 mg/dL (ref 70–179)
PROTEIN TOTAL: 5.8 g/dL — ABNORMAL LOW (ref 6.5–8.3)
SODIUM: 133 mmol/L — ABNORMAL LOW (ref 135–145)

## 2019-08-12 LAB — INR: Coagulation tissue factor induced.INR:RelTime:Pt:PPP:Qn:Coag: 1.67

## 2019-08-12 LAB — CBC
HEMATOCRIT: 27.8 % — ABNORMAL LOW (ref 36.0–46.0)
MEAN CORPUSCULAR HEMOGLOBIN CONC: 29.5 g/dL — ABNORMAL LOW (ref 31.0–37.0)
MEAN CORPUSCULAR HEMOGLOBIN: 24.9 pg — ABNORMAL LOW (ref 26.0–34.0)
MEAN CORPUSCULAR VOLUME: 84.6 fL (ref 80.0–100.0)
MEAN PLATELET VOLUME: 13.6 fL — ABNORMAL HIGH (ref 7.0–10.0)
PLATELET COUNT: 62 10*9/L — ABNORMAL LOW (ref 150–440)
RED BLOOD CELL COUNT: 3.28 10*12/L — ABNORMAL LOW (ref 4.00–5.20)
RED CELL DISTRIBUTION WIDTH: 19.4 % — ABNORMAL HIGH (ref 12.0–15.0)
WBC ADJUSTED: 4.9 10*9/L (ref 4.5–11.0)

## 2019-08-12 LAB — PROTIME-INR: INR: 1.67

## 2019-08-12 LAB — GAMMA GLUTAMYL TRANSFERASE: Gamma glutamyl transferase:CCnc:Pt:Ser/Plas:Qn:: 32

## 2019-08-12 LAB — POTASSIUM: Potassium:SCnc:Pt:Ser/Plas:Qn:: 4.2

## 2019-08-12 LAB — RED BLOOD CELL COUNT: Lab: 3.28 — ABNORMAL LOW

## 2019-08-12 NOTE — Unmapped (Signed)
[facility], [place]      Referring Provider:  Collie Siad, MD  [address]  [place],  [place] [address]    Primary Care Provider:  Ignatius Specking, MD    Other Specialist(s):          Reason for Visit: Marilyn French(DOB: [birth year]) is a 63 y.o. female who returns for decompensated cirrhosis.    Problem List:    has Abnormal echocardiogram; NAFLD (nonalcoholic fatty liver disease); Cirrhosis (CMS-HCC); GAVE (gastric antral vascular ectasia); Hypothyroid; Chronic blood loss anemia; Generalized anxiety disorder; Hepatic encephalopathy (CMS-HCC); Iron deficiency anemia; Type 2 diabetes mellitus without complication, with long-term current use of insulin (CMS-HCC); Pulmonary hypertension (CMS-HCC); and Aspiration into airway on their problem list.     Assessment:        63 yo with decompensated NASH cirrhosis and poor functional status.  Cirrhosis complicated by anemia fatigue, PSE and fluid overload.    Not currently transplant candidate due to frailty, poor functional status.  She has been turned down twice for OLT already    I spent approximately 45 trying to re-orient husband due to poor healthcare literacy and explaining the MELD scoring system.    HGB as good today has it has been recently.      MELD-Na score: 24 at 07/12/2019  4:20 AM  MELD score: 18 at 07/12/2019  4:20 AM  Calculated from:  Serum Creatinine: 0.99 mg/dL (Rounded to 1 mg/dL) at 40/98/1191  4:78 AM  Serum Sodium: 129 mmol/L at 07/12/2019  4:20 AM  Total Bilirubin: 4.0 mg/dL at 29/56/2130  8:65 AM  INR(ratio): 1.74 at 07/12/2019  4:20 AM  Age: 10 years 1 month     Plan:         -PT to regain strength  -further evaluations of prior anaphylactic reactions to IV iron.        CHIEF COMPLAINT: fatigue    History of Present Illness: This is a 63 y.o. year old female with history of cirrhosis secondary to NAFLD.    She was last seen hepatology clinic on 07/2018.  Since then she has had multiple hospitalizations for anemia.  She has been deferred/turned down for OLT x 2 since then as well due to frailty.       Complications of her liver disease include variceal bleed, ascites and hepatic encephalopathy.    In regards to encephalopathy she is currently on lactulose monotherapy.  She is achieving 3???5 bowel movements a day.  Her ascites is currently being managed with torsemide 20 mg daily and spironolactone 50 mg daily.  She has not required any LVP's.  She did undergo a right heart catheterization in 01/2018 at [facility] which showed no evidence for pulmonary hypertension.  She is also seen [facility] pulmonary.  She underwent a DEXA scan on 12/25/2017.  An MRI from 12/25/2017 showed no evidence for [facility].  There is questionable nonocclusive portal vein thrombus.  Since her last clinic visit patient did undergo a repeat MRI and CT on 03/28/2018.  Her MRI was reviewed at [facility] conference and felt the PVT persists and is concerning for progression, worsening portal htn and compromising her transplant candidacy by loss of PV and SMV. ??Anti-coagulation is not a good option due to her chronic PHG/GAVE blood loss.  We recommended proceeding with a TIPS.  She underwent a TIPS on 07/08/2018.  She also underwent embolization of mesocaval varices.  Her hepatic venous pressure gradient went from 10 to 2 mm Hg.  Since her TIPS she was hospitalized for hepatic encephalopathy on 07/26/2018 and now started on Xifaxan.  Patient has been having some difficulty in obtaining due to its cost.  She is scheduled to see transplant surgery today as well as get ultrasound.  She does have a history of hypertension and hyperlipidemia. She is immune to hepatitis A and B.    Social History:    Social History     Socioeconomic History   ??? Marital status: Married     Spouse name: Not on file   ??? Number of children: Not on file   ??? Years of education: Not on file   ??? Highest education level: Not on file   Occupational History   ??? Not on file   Social Needs   ??? Financial resource strain: Not on file   ??? Food insecurity     Worry: Patient refused     Inability: Patient refused   ??? Transportation needs     Medical: Not on file     Non-medical: Not on file   Tobacco Use   ??? Smoking status: Never Smoker   ??? Smokeless tobacco: Never Used   Substance and Sexual Activity   ??? Alcohol use: No   ??? Drug use: No   ??? Sexual activity: Not on file   Lifestyle   ??? Physical activity     Days per week: Not on file     Minutes per session: Not on file   ??? Stress: Not on file   Relationships   ??? Social Wellsite geologist on phone: Not on file     Gets together: Not on file     Attends religious service: Not on file     Active member of club or organization: Not on file     Attends meetings of clubs or organizations: Not on file     Relationship status: Not on file   Other Topics Concern   ??? Not on file   Social History Narrative   ??? Not on file         Medications:      Current Outpatient Medications:   ???  bumetanide (BUMEX) 2 MG tablet, Take 1 tablet (2 mg total) by mouth Two (2) times a day., Disp: 60 tablet, Rfl: 11  ???  busPIRone (BUSPAR) 10 MG tablet, Take 10 mg by mouth two (2) times a day., Disp: , Rfl:   ???  cholecalciferol, vitamin D3, (CHOLECALCIFEROL) 1,000 unit tablet, Take 1,000 Units by mouth every other day. , Disp: , Rfl:   ???  insulin glargine (LANTUS SOLOSTAR U-100 INSULIN) 100 unit/mL (3 mL) injection pen, Inject 0.45 mL (45 Units total) under the skin daily., Disp: 1350 Units, Rfl: 0  ???  lactulose (CHRONULAC) 10 gram/15 mL solution, Take 20 g by mouth Four (4) times a day. , Disp: , Rfl:   ???  levothyroxine (SYNTHROID, LEVOTHROID) 25 MCG tablet, Take 25 mcg by mouth daily., Disp: , Rfl:   ???  lidocaine-prilocaine (EMLA) 2.5-2.5 % cream, , Disp: , Rfl:   ???  magnesium oxide (MAG-OX) 400 mg (241.3 mg magnesium) tablet, Take 800 mg by mouth daily. Take for hand and/or leg cramping. , Disp: , Rfl:   ???  OXYGEN-AIR DELIVERY SYSTEMS MISC, Inhale 2 L nightly., Disp: , Rfl:   ???  pantoprazole (PROTONIX) 40 MG tablet, Take 40 mg by mouth Two (2) times a day. , Disp: , Rfl:   ???  pediatric multivit-iron-min (FLINTSTONES COMPLETE) tablet, Chew 2 tablets daily. , Disp: , Rfl:   ???  pen needle, diabetic 31 gauge x 1/4 Ndle, , Disp: , Rfl:   ???  potassium chloride SA (K-DUR,KLOR-CON) 20 MEQ tablet, Take 20 mEq by mouth daily. , Disp: , Rfl:   ???  rifAXIMin (XIFAXAN) 550 mg Tab, Take 1 tablet (550 mg total) by mouth Two (2) times a day., Disp: 60 tablet, Rfl: 11  ???  rosuvastatin (CRESTOR) 5 MG tablet, Take 5 mg by mouth every other day., Disp: , Rfl:   ???  sertraline (ZOLOFT) 50 MG tablet, Take 1 tablet (50 mg total) by mouth daily. 1 tablet (50mg ) by mouth daily., Disp: 30 tablet, Rfl: 1  ???  spironolactone (ALDACTONE) 100 MG tablet, Take 1 tablet (100 mg total) by mouth daily., Disp: 30 tablet, Rfl: 11  ???  sucralfate (CARAFATE) 100 mg/mL suspension, Take 10 mL (1 g total) by mouth Four (4) times a day., Disp: 1200 mL, Rfl: 5  ???  insulin ASPART (NOVOLOG FLEXPEN U-100 INSULIN) 100 unit/mL injection pen, Use with given sliding scale  CBG 70-120: 0 units CBG 121-150: 1 unit CBG 151-200: 2 units CBG 201-250: 3 units CBG 251-300: 5 units CBG 301-350: 7 units CBG greater than 351: 9 units, Disp: , Rfl:       Vital Signs:     BP 134/58  - Pulse 99  - Temp 36.1 ??C (97 ??F) (Temporal)  - Resp 18  - Wt 78.6 kg (173 lb 3.2 oz)  - LMP  (LMP Unknown)  - SpO2 99%  - BMI 29.73 kg/m??   Body mass index is 29.73 kg/m??.    Physical Exam:    Normal comprehensive exam:      Constitutional:   Alert, oriented x 3, no acute distress, well nourished   Mental Status:   Thought organized, appropriate affect, normal fluent speech.   HEENT:   PEERL, conjunctiva clear, anicteric, oropharynx clear, neck supple, no LAD.   Respiratory: Clear to auscultation, and percussion to the bases, unlabored breathing.     Cardiac: Regular rate and rhythm normal S1 and S2, no murmur.      Abdomen: Soft, non-distended, non-tender, no organomegaly or masses.     Perianal/Rectal Exam Not performed. Extremities:   No edema, well perfused.   Musculoskeletal: No joint swelling or tenderness noted, no deformities.     Skin: No rashes, jaundice or skin lesions noted.     Neuro: No focal deficits.          Diagnostic Studies:  I have reviewed all pertinent diagnostic studies, including:    GI Procedures  reviewed  Radiographic studies  reviewed    Laboratory results    No visits with results within 1 Week(s) from this visit.   Latest known visit with results is:   Abstract on 07/24/2019   Component Date Value Ref Range Status   ??? HGB 07/21/2019 7.3  g/dL Final   ??? HCT 16/06/9603 25.2  % Final     Lab Results   Component Value Date    WBC 4.9 08/12/2019    HGB 8.2 (L) 08/12/2019    HCT 27.8 (L) 08/12/2019    PLT 62 (L) 08/12/2019       Lab Results   Component Value Date    NA 133 (L) 08/12/2019    K 4.2 08/12/2019    CL 105 08/12/2019    CO2 21.0 (L) 08/12/2019    BUN  28 (H) 08/12/2019    CREATININE 1.36 (H) 08/12/2019    GLU 168 08/12/2019    CALCIUM 8.7 08/12/2019    MG 2.0 06/27/2019    PHOS 3.9 02/28/2019       Lab Results   Component Value Date    BILITOT 2.6 (H) 08/12/2019    BILIDIR 0.50 (H) 06/14/2019    PROT 5.8 (L) 08/12/2019    ALBUMIN 2.8 (L) 08/12/2019    ALT 29 08/12/2019    AST 37 08/12/2019    ALKPHOS 98 08/12/2019    GGT 32 08/12/2019       Lab Results   Component Value Date    PT 19.4 (H) 08/12/2019    INR 1.67 08/12/2019    APTT 26.2 02/28/2019

## 2019-08-12 NOTE — Unmapped (Signed)
Port on right chest accessed with a 20 G 3/4in needle  blood return noted. Blood drawn and sent to lab. Port flushed per protocol and heparin locked. Port de accessed. Site clean and dry. Pt discharged in stable condition with her husband and patient transport staff to the women's lobby

## 2019-08-13 DIAGNOSIS — K7469 Other cirrhosis of liver: Principal | ICD-10-CM

## 2019-08-18 ENCOUNTER — Encounter (HOSPITAL_COMMUNITY): Payer: PPO

## 2019-08-18 ENCOUNTER — Encounter (HOSPITAL_COMMUNITY)
Admission: RE | Admit: 2019-08-18 | Discharge: 2019-08-18 | Disposition: A | Discharge status: Home / Self Care | Payer: PPO | Source: Ambulatory Visit | Attending: Internal Medicine | Admitting: Internal Medicine

## 2019-08-18 ENCOUNTER — Other Ambulatory Visit: Payer: Self-pay

## 2019-08-18 DIAGNOSIS — D509 Iron deficiency anemia, unspecified: Secondary | ICD-10-CM | POA: Diagnosis not present

## 2019-08-18 LAB — SAMPLE TO BLOOD BANK

## 2019-08-18 LAB — HEMOGLOBIN AND HEMATOCRIT, BLOOD
HCT: 27.7 % — ABNORMAL LOW (ref 36.0–46.0)
Hemoglobin: 7.7 g/dL — ABNORMAL LOW (ref 12.0–15.0)

## 2019-08-18 MED ORDER — HEPARIN SOD (PORK) LOCK FLUSH 100 UNIT/ML IV SOLN
500.0000 [IU] | Freq: Once | INTRAVENOUS | Status: AC
  Administered 2019-08-18: 500 [IU] via INTRAVENOUS

## 2019-08-19 DIAGNOSIS — K7290 Hepatic failure, unspecified without coma: Principal | ICD-10-CM

## 2019-08-19 MED ORDER — RIFAXIMIN 550 MG TABLET
ORAL_TABLET | Freq: Two times a day (BID) | ORAL | 11 refills | 30.00000 days | Status: CP
Start: 2019-08-19 — End: ?

## 2019-08-19 NOTE — Unmapped (Addendum)
Call placed and spoke with Marilyn French - refill for xifaxan rx sent to [facility] shared services as patient requests financial assistance.     Discussed documented iron allergy - patient states on two separate occasions she developed chest / back pain while received infusion. The first time - infusion was completed, the second was stopped by bedside nurse.     Call placed and spoke with staff @ Dr. Patty Sermons office - Requested assistance in setting up local iron infusions if deemed appropriate re: history of questionable reaction.

## 2019-08-20 DIAGNOSIS — K7290 Hepatic failure, unspecified without coma: Principal | ICD-10-CM

## 2019-08-20 NOTE — Unmapped (Signed)
Per test claim for [facility] at the [facility], patient needs Medication Assistance Program for High Copay OF [phone].

## 2019-08-25 ENCOUNTER — Telehealth (INDEPENDENT_AMBULATORY_CARE_PROVIDER_SITE_OTHER): Payer: Self-pay | Admitting: Internal Medicine

## 2019-08-25 ENCOUNTER — Emergency Department (HOSPITAL_COMMUNITY): Payer: PPO

## 2019-08-25 ENCOUNTER — Other Ambulatory Visit: Payer: Self-pay

## 2019-08-25 ENCOUNTER — Encounter (HOSPITAL_COMMUNITY): Admission: RE | Admit: 2019-08-25 | Payer: PPO | Source: Ambulatory Visit

## 2019-08-25 ENCOUNTER — Other Ambulatory Visit (INDEPENDENT_AMBULATORY_CARE_PROVIDER_SITE_OTHER): Payer: Self-pay | Admitting: Internal Medicine

## 2019-08-25 ENCOUNTER — Emergency Department (HOSPITAL_COMMUNITY)
Admission: EM | Admit: 2019-08-25 | Discharge: 2019-08-25 | Disposition: A | Discharge status: Home / Self Care | Payer: PPO | Source: Home / Self Care | Attending: Emergency Medicine | Admitting: Emergency Medicine

## 2019-08-25 ENCOUNTER — Encounter (HOSPITAL_COMMUNITY): Payer: Self-pay

## 2019-08-25 DIAGNOSIS — I110 Hypertensive heart disease with heart failure: Secondary | ICD-10-CM | POA: Insufficient documentation

## 2019-08-25 DIAGNOSIS — I509 Heart failure, unspecified: Secondary | ICD-10-CM | POA: Diagnosis present

## 2019-08-25 DIAGNOSIS — S0990XA Unspecified injury of head, initial encounter: Secondary | ICD-10-CM | POA: Diagnosis not present

## 2019-08-25 DIAGNOSIS — E039 Hypothyroidism, unspecified: Secondary | ICD-10-CM | POA: Diagnosis present

## 2019-08-25 DIAGNOSIS — E7800 Pure hypercholesterolemia, unspecified: Secondary | ICD-10-CM | POA: Diagnosis present

## 2019-08-25 DIAGNOSIS — Z833 Family history of diabetes mellitus: Secondary | ICD-10-CM | POA: Diagnosis not present

## 2019-08-25 DIAGNOSIS — U071 COVID-19: Secondary | ICD-10-CM | POA: Diagnosis present

## 2019-08-25 DIAGNOSIS — E119 Type 2 diabetes mellitus without complications: Secondary | ICD-10-CM | POA: Diagnosis present

## 2019-08-25 DIAGNOSIS — I959 Hypotension, unspecified: Secondary | ICD-10-CM | POA: Diagnosis not present

## 2019-08-25 DIAGNOSIS — K7682 Hepatic encephalopathy: Secondary | ICD-10-CM

## 2019-08-25 DIAGNOSIS — D649 Anemia, unspecified: Secondary | ICD-10-CM | POA: Diagnosis not present

## 2019-08-25 DIAGNOSIS — I2510 Atherosclerotic heart disease of native coronary artery without angina pectoris: Secondary | ICD-10-CM | POA: Diagnosis present

## 2019-08-25 DIAGNOSIS — K219 Gastro-esophageal reflux disease without esophagitis: Secondary | ICD-10-CM | POA: Diagnosis present

## 2019-08-25 DIAGNOSIS — J9611 Chronic respiratory failure with hypoxia: Secondary | ICD-10-CM | POA: Diagnosis present

## 2019-08-25 DIAGNOSIS — Z20828 Contact with and (suspected) exposure to other viral communicable diseases: Secondary | ICD-10-CM | POA: Insufficient documentation

## 2019-08-25 DIAGNOSIS — Z794 Long term (current) use of insulin: Secondary | ICD-10-CM | POA: Diagnosis not present

## 2019-08-25 DIAGNOSIS — K7290 Hepatic failure, unspecified without coma: Secondary | ICD-10-CM

## 2019-08-25 DIAGNOSIS — Z79899 Other long term (current) drug therapy: Secondary | ICD-10-CM | POA: Insufficient documentation

## 2019-08-25 DIAGNOSIS — M1990 Unspecified osteoarthritis, unspecified site: Secondary | ICD-10-CM | POA: Diagnosis present

## 2019-08-25 DIAGNOSIS — R4182 Altered mental status, unspecified: Secondary | ICD-10-CM | POA: Diagnosis present

## 2019-08-25 DIAGNOSIS — Z801 Family history of malignant neoplasm of trachea, bronchus and lung: Secondary | ICD-10-CM | POA: Diagnosis not present

## 2019-08-25 DIAGNOSIS — K7210 Chronic hepatic failure without coma: Secondary | ICD-10-CM

## 2019-08-25 DIAGNOSIS — R410 Disorientation, unspecified: Secondary | ICD-10-CM | POA: Diagnosis not present

## 2019-08-25 DIAGNOSIS — E1165 Type 2 diabetes mellitus with hyperglycemia: Secondary | ICD-10-CM | POA: Diagnosis not present

## 2019-08-25 DIAGNOSIS — Z03818 Encounter for observation for suspected exposure to other biological agents ruled out: Secondary | ICD-10-CM | POA: Diagnosis not present

## 2019-08-25 DIAGNOSIS — K31819 Angiodysplasia of stomach and duodenum without bleeding: Secondary | ICD-10-CM | POA: Diagnosis present

## 2019-08-25 DIAGNOSIS — Z8249 Family history of ischemic heart disease and other diseases of the circulatory system: Secondary | ICD-10-CM | POA: Diagnosis not present

## 2019-08-25 DIAGNOSIS — I5032 Chronic diastolic (congestive) heart failure: Secondary | ICD-10-CM | POA: Insufficient documentation

## 2019-08-25 DIAGNOSIS — F329 Major depressive disorder, single episode, unspecified: Secondary | ICD-10-CM | POA: Diagnosis present

## 2019-08-25 DIAGNOSIS — M797 Fibromyalgia: Secondary | ICD-10-CM | POA: Diagnosis present

## 2019-08-25 DIAGNOSIS — K7581 Nonalcoholic steatohepatitis (NASH): Secondary | ICD-10-CM | POA: Diagnosis present

## 2019-08-25 DIAGNOSIS — N179 Acute kidney failure, unspecified: Secondary | ICD-10-CM | POA: Diagnosis present

## 2019-08-25 DIAGNOSIS — Z800 Family history of malignant neoplasm of digestive organs: Secondary | ICD-10-CM | POA: Diagnosis not present

## 2019-08-25 DIAGNOSIS — R531 Weakness: Secondary | ICD-10-CM | POA: Diagnosis not present

## 2019-08-25 DIAGNOSIS — R404 Transient alteration of awareness: Secondary | ICD-10-CM | POA: Diagnosis not present

## 2019-08-25 DIAGNOSIS — F419 Anxiety disorder, unspecified: Secondary | ICD-10-CM | POA: Diagnosis present

## 2019-08-25 DIAGNOSIS — M810 Age-related osteoporosis without current pathological fracture: Secondary | ICD-10-CM | POA: Diagnosis present

## 2019-08-25 DIAGNOSIS — Z885 Allergy status to narcotic agent status: Secondary | ICD-10-CM | POA: Diagnosis not present

## 2019-08-25 DIAGNOSIS — K7200 Acute and subacute hepatic failure without coma: Secondary | ICD-10-CM | POA: Diagnosis present

## 2019-08-25 LAB — COMPREHENSIVE METABOLIC PANEL
ALT: 28 U/L (ref 0–44)
AST: 29 U/L (ref 15–41)
Albumin: 2.9 g/dL — ABNORMAL LOW (ref 3.5–5.0)
Alkaline Phosphatase: 106 U/L (ref 38–126)
Anion gap: 12 (ref 5–15)
BUN: 32 mg/dL — ABNORMAL HIGH (ref 8–23)
CO2: 20 mmol/L — ABNORMAL LOW (ref 22–32)
Calcium: 9.8 mg/dL (ref 8.9–10.3)
Chloride: 102 mmol/L (ref 98–111)
Creatinine, Ser: 1.56 mg/dL — ABNORMAL HIGH (ref 0.44–1.00)
GFR calc Af Amer: 40 mL/min — ABNORMAL LOW (ref >60)
GFR calc non Af Amer: 35 mL/min — ABNORMAL LOW (ref >60)
Glucose, Bld: 170 mg/dL — ABNORMAL HIGH (ref 70–99)
Potassium: 4 mmol/L (ref 3.5–5.1)
Sodium: 134 mmol/L — ABNORMAL LOW (ref 135–145)
Total Bilirubin: 2.9 mg/dL — ABNORMAL HIGH (ref 0.3–1.2)
Total Protein: 6.1 g/dL — ABNORMAL LOW (ref 6.5–8.1)

## 2019-08-25 LAB — CBC WITH DIFFERENTIAL/PLATELET
Abs Immature Granulocytes: 0.01 10*3/uL (ref 0.00–0.07)
Basophils Absolute: 0 10*3/uL (ref 0.0–0.1)
Basophils Relative: 1 %
Eosinophils Absolute: 0 10*3/uL (ref 0.0–0.5)
Eosinophils Relative: 1 %
HCT: 28.8 % — ABNORMAL LOW (ref 36.0–46.0)
Hemoglobin: 8.1 g/dL — ABNORMAL LOW (ref 12.0–15.0)
Immature Granulocytes: 0 %
Lymphocytes Relative: 18 %
Lymphs Abs: 0.9 10*3/uL (ref 0.7–4.0)
MCH: 22.8 pg — ABNORMAL LOW (ref 26.0–34.0)
MCHC: 28.1 g/dL — ABNORMAL LOW (ref 30.0–36.0)
MCV: 81.1 fL (ref 80.0–100.0)
Monocytes Absolute: 0.7 10*3/uL (ref 0.1–1.0)
Monocytes Relative: 15 %
Neutro Abs: 3.3 10*3/uL (ref 1.7–7.7)
Neutrophils Relative %: 65 %
Platelets: 56 10*3/uL — ABNORMAL LOW (ref 150–400)
RBC: 3.55 MIL/uL — ABNORMAL LOW (ref 3.87–5.11)
RDW: 18.5 % — ABNORMAL HIGH (ref 11.5–15.5)
WBC: 5.1 10*3/uL (ref 4.0–10.5)
nRBC: 0 % (ref 0.0–0.2)

## 2019-08-25 LAB — URINALYSIS, ROUTINE W REFLEX MICROSCOPIC
Bilirubin Urine: NEGATIVE
Glucose, UA: NEGATIVE mg/dL
Ketones, ur: NEGATIVE mg/dL
Leukocytes,Ua: NEGATIVE
Nitrite: NEGATIVE
Protein, ur: NEGATIVE mg/dL
Specific Gravity, Urine: 1.008 (ref 1.005–1.030)
pH: 5 (ref 5.0–8.0)

## 2019-08-25 LAB — TYPE AND SCREEN
ABO/RH(D): O NEG
Antibody Screen: NEGATIVE

## 2019-08-25 LAB — AMMONIA: Ammonia: 40 umol/L — ABNORMAL HIGH (ref 9–35)

## 2019-08-25 NOTE — Discharge Instructions (Addendum)
Test showed no life-threatening condition.  Increase fluids.  Follow-up with Dr. Laural Golden

## 2019-08-25 NOTE — Telephone Encounter (Signed)
Spouse called stated patient had a rough weekend - stated her ammonia levels are up and he will be taking her to [facility] around 11:00 - please advise

## 2019-08-25 NOTE — ED Triage Notes (Signed)
Pt with history of cirrhosis. Pt was suppose to have labs drawn this am but started having AMS on Saturday per husband. Per husband, has not missed any lactulose.

## 2019-08-25 NOTE — ED Notes (Signed)
Port accessed and labs drawn

## 2019-08-25 NOTE — Progress Notes (Signed)
Patient's husband called stating that Maria [name] has been confused. She is to go to the lab for H&H.  Will add serum ammonia and metabolic 7

## 2019-08-25 NOTE — Telephone Encounter (Signed)
Maria [name] patient's husband called the office 4 times then presented to the office. Prior to this he told us that she had had a rough weekend and that he felt her Ammonia was elevated. Discussed with Dr.Rehman and he ordered ammonia and B-Met to be added to the lab work that the patient was to have drawn today at 2 am.  Maria [name] presents to the office her state that [name] had passed out and that he lowered her to the floor. A nephew then helped him to get her into car , he says that she is loopy and that he is taking her to [facility] ED.  Dr.Rehman was made aware , he advised that the patient go to the ED, and that the he would call the ED physician to make aware.  Maria [name] presented to the ED.

## 2019-08-26 ENCOUNTER — Encounter (INDEPENDENT_AMBULATORY_CARE_PROVIDER_SITE_OTHER): Payer: Self-pay

## 2019-08-26 LAB — SARS CORONAVIRUS 2 (TAT 6-24 HRS): SARS Coronavirus 2: NEGATIVE

## 2019-08-26 NOTE — ED Provider Notes (Signed)
[facility] EMERGENCY DEPARTMENT Provider Note   CSN: [id number] Arrival date & time: 08/25/19  1110     History   Chief Complaint Chief Complaint  Patient presents with  . Altered Mental Status    HPI Maria [name] is a 63 y.o. female.  Level 5 caveat for altered mental status.  Most of history obtained from husband.  He states that she has been a little bit "off" for the past 2 to 3 days.  Patient has a complex medical history including end-stage cirrhosis, CHF, anemia, diabetes, esophageal varices, fibromyalgia, several others.  No fever, sweats, chills, cough, stiff neck, chest pain.  She has been taking her lactulose.  Husband states she is often anemic and gets transfused at hemoglobin less than 7     HPI  Past Medical History:  Diagnosis Date  . Anemia   . CHF (congestive heart failure) ([place])   . Cirrhosis ([place])   . Depression   . Diabetes mellitus   . Esophageal bleed, non-variceal   . Eye hemorrhage    Behind left eye  . Fibromyalgia   . Hypercholesteremia   . Hypertension   . Hypothyroidism   . NAFLD (nonalcoholic fatty liver disease)   . Osteoarthrosis   . Osteoporosis   . PONV (postoperative nausea and vomiting)   . UTI (urinary tract infection) 11/12 end of the month   Patient feels that she passed a kidney stone at that time    Patient Active Problem List   Diagnosis Date Noted  . Altered mental status 02/27/2019  . Bacteriuria 02/27/2019  . Acute on chronic anemia 02/24/2019  . Acute hepatic encephalopathy 07/26/2018  . Obesity, Class III, BMI 40-49.9 (morbid obesity) ([place])   . Melena   . Anemia 07/02/2018  . UGI bleed 11/09/2017  . Insulin dependent diabetes mellitus   . Absolute anemia 09/13/2017  . Idiopathic esophageal varices with bleeding ([place]) 09/13/2017  . Other cirrhosis of liver ([place]) 08/22/2017  . NAFLD (nonalcoholic fatty liver disease) 08/14/2017  . Encephalopathy, hepatic ([place]) 08/14/2017  . Metabolic encephalopathy  62/83/6629  . Hyperammonemia ([place]) 07/18/2017  . Hypothyroidism 07/18/2017  . Acute on chronic right-sided congestive heart failure ([place]) 07/06/2017  . Pulmonary hypertension ([place]) 07/06/2017  . GAVE (gastric antral vascular ectasia) 07/06/2017  . GI bleeding 07/06/2017  . Symptomatic anemia 07/04/2017  . Hypokalemia 07/04/2017  . Chronic diastolic heart failure ([place]) 06/11/2017  . CAD (coronary artery disease) 06/11/2017  . Nocturnal hypoxemia 02/22/2017  . Idiopathic esophageal varices without bleeding ([place]) 01/31/2017  . Dyspnea and respiratory abnormalities 01/25/2017  . Esophageal varices in cirrhosis ([place]) 05/19/2016  . Hepatic cirrhosis ([place]) 05/19/2016  . Esophageal varices ([place]) 12/09/2012  . Dysphagia, unspecified(787.20) 12/09/2012  . Anxiety 08/26/2012  . Cirrhosis, nonalcoholic ([place]) 47/65/4650  . Diabetes mellitus ([place]) 10/09/2011  . History of esophageal varices 10/09/2011  . Iron deficiency anemia 10/09/2011  . GERD (gastroesophageal reflux disease) 10/09/2011  . Thrombocytopenia ([place]) 10/09/2011    Past Surgical History:  Procedure Laterality Date  . APPENDECTOMY  1980  . BILATERAL SALPINGOOPHORECTOMY    . CHOLECYSTECTOMY    . COLONOSCOPY  03/15/2011  . COLONOSCOPY N/A 03/24/2016   Procedure: COLONOSCOPY;  Surgeon: [name], MD;  Location[facility];  Service: Endoscopy;  Laterality: N/A;  855  . ESOPHAGEAL BANDING  04/24/2012   Procedure: ESOPHAGEAL BANDING;  Surgeon: [name], MD;  Location: [facility];  Service: Endoscopy;  Laterality: N/A;  . Esophageal BANDING  08/03/2012   [facility] in [place] , [place]  09/24/2012   Procedure: ESOPHAGEAL BANDING;  Surgeon: [name], MD;  Location: [facility] ORS;  Service: Endoscopy;  Laterality: N/A;  Banding x 3  . ESOPHAGEAL BANDING N/A 01/29/2013   Procedure: ESOPHAGEAL BANDING;  Surgeon: [name], MD;  Location: [facility];  Service: Endoscopy;  Laterality:  N/A;  . ESOPHAGEAL BANDING N/A 06/19/2014   Procedure: ESOPHAGEAL BANDING;  Surgeon: [name], MD;  [facility];  Service: Endoscopy;  Laterality: N/A;  . ESOPHAGEAL BANDING N/A 01/05/2016   Procedure: ESOPHAGEAL BANDING;  Surgeon: [name], MD;  [facility];  Service: Endoscopy;  Laterality: N/A;  . ESOPHAGEAL BANDING N/A 08/03/2016   Procedure: ESOPHAGEAL BANDING;  Surgeon: [name], MD;  [facility]: [facility];  Service: Endoscopy;  Laterality: N/A;  . ESOPHAGEAL BANDING N/A 05/02/2017   Procedure: ESOPHAGEAL BANDING;  Surgeon: [name], MD;  [facility]: [facility];  Service: Endoscopy;  Laterality: N/A;  . ESOPHAGOGASTRODUODENOSCOPY  04/24/2012   Procedure: ESOPHAGOGASTRODUODENOSCOPY (EGD);  Surgeon: [name], MD;  [facility];  Service: Endoscopy;  Laterality: N/A;  300  . ESOPHAGOGASTRODUODENOSCOPY N/A 01/29/2013   Procedure: ESOPHAGOGASTRODUODENOSCOPY (EGD);  Surgeon: [name], MD;  [facility];  Service: Endoscopy;  Laterality: N/A;  1200  . ESOPHAGOGASTRODUODENOSCOPY N/A 05/22/2013   Procedure: ESOPHAGOGASTRODUODENOSCOPY (EGD);  Surgeon: [name], MD;  [facility];  Service: Endoscopy;  Laterality: N/A;  1:55  . ESOPHAGOGASTRODUODENOSCOPY N/A 12/31/2013   Procedure: ESOPHAGOGASTRODUODENOSCOPY (EGD);  Surgeon: [name], MD;  [facility];  Service: Endoscopy;  Laterality: N/A;  1200  . ESOPHAGOGASTRODUODENOSCOPY N/A 06/19/2014   Procedure: ESOPHAGOGASTRODUODENOSCOPY (EGD);  Surgeon: [name], MD;  [facility];  Service: Endoscopy;  Laterality: N/A;  1055  . ESOPHAGOGASTRODUODENOSCOPY N/A 01/15/2015   Procedure: ESOPHAGOGASTRODUODENOSCOPY (EGD);  Surgeon: [name], MD;  [facility];  Service: Endoscopy;  Laterality: N/A;  730 - moved to 9:45 - moved to 1250-Ann notified pt  . ESOPHAGOGASTRODUODENOSCOPY N/A 06/30/2015   Procedure:  ESOPHAGOGASTRODUODENOSCOPY (EGD);  Surgeon: [name], MD;  [facility];  Service: Endoscopy;  Laterality: N/A;  1200  . ESOPHAGOGASTRODUODENOSCOPY N/A 01/05/2016   Procedure: ESOPHAGOGASTRODUODENOSCOPY (EGD);  Surgeon: [name], MD;  [facility];  Service: Endoscopy;  Laterality: N/A;  830  . ESOPHAGOGASTRODUODENOSCOPY N/A 08/03/2016   Procedure: ESOPHAGOGASTRODUODENOSCOPY (EGD);  Surgeon: [name], MD;  [facility]: [facility];  Service: Endoscopy;  Laterality: N/A;  930  . ESOPHAGOGASTRODUODENOSCOPY N/A 05/02/2017   Procedure: ESOPHAGOGASTRODUODENOSCOPY (EGD);  Surgeon: [name], MD;  [facility]: [facility];  Service: Endoscopy;  Laterality: N/A;  12:00  . ESOPHAGOGASTRODUODENOSCOPY N/A 08/17/2017   Procedure: ESOPHAGOGASTRODUODENOSCOPY (EGD);  Surgeon: [name], MD;  [facility];  Service: Endoscopy;  Laterality: N/A;  8:15  . ESOPHAGOGASTRODUODENOSCOPY N/A 09/12/2017   Procedure: ESOPHAGOGASTRODUODENOSCOPY (EGD);  Surgeon: [name], MD;  [facility];  Service: Endoscopy;  Laterality: N/A;  1040  . ESOPHAGOGASTRODUODENOSCOPY N/A 10/10/2017   Procedure: ESOPHAGOGASTRODUODENOSCOPY (EGD);  Surgeon: [name], MD;  [facility];  Service: Endoscopy;  Laterality: N/A;  225  . ESOPHAGOGASTRODUODENOSCOPY N/A 11/09/2017   Procedure: ESOPHAGOGASTRODUODENOSCOPY (EGD);  Surgeon: [name], MD;  [facility];  Service: Endoscopy;  Laterality: N/A;  .  ESOPHAGOGASTRODUODENOSCOPY (EGD) WITH PROPOFOL  09/24/2012   Procedure: ESOPHAGOGASTRODUODENOSCOPY (EGD) WITH PROPOFOL;  Surgeon: [name], MD;  Location: [facility];  Service: Endoscopy;  Laterality: N/A;  GE junction at 36  . ESOPHAGOGASTRODUODENOSCOPY (EGD) WITH PROPOFOL N/A 07/06/2017   Procedure: ESOPHAGOGASTRODUODENOSCOPY (EGD) WITH PROPOFOL;  Surgeon: [name], MD;  [facility];  Service: Endoscopy;   Laterality: N/A;  . ESOPHAGOGASTRODUODENOSCOPY W/ BANDING  08/2010  . GIVENS CAPSULE STUDY N/A 07/05/2017   Procedure: GIVENS CAPSULE STUDY;  Surgeon: [name], MD;  [facility];  Service: Endoscopy;  Laterality: N/A;  . HOT HEMOSTASIS N/A 08/17/2017   Procedure: HOT HEMOSTASIS (ARGON PLASMA COAGULATION/BICAP);  Surgeon: [name], MD;  [facility];  Service: Endoscopy;  Laterality: N/A;  . HOT HEMOSTASIS  09/12/2017   Procedure: HOT HEMOSTASIS (ARGON PLASMA COAGULATION/BICAP);  Surgeon: [name], MD;  [facility];  Service: Endoscopy;;  gastric  . HOT HEMOSTASIS  10/10/2017   Procedure: HOT HEMOSTASIS (ARGON PLASMA COAGULATION/BICAP);  Surgeon: [name], MD;  [facility];  Service: Endoscopy;;  . POLYPECTOMY  03/24/2016   Procedure: POLYPECTOMY;  Surgeon: [name], MD;  [facility];  Service: Endoscopy;;  sigmoid polyp  . PORTACATH PLACEMENT Right 06/02/2019   Procedure: INSERTION PORT-A-CATH;  Surgeon: Aviva Signs, MD;  Location: [facility];  Service: General;  Laterality: Right;  . RIGHT HEART CATH N/A 04/16/2017   Procedure: Right Heart Cath;  Surgeon: [name], MD;  Location: [place] CV LAB;  Service: Cardiovascular;  Laterality: N/A;  . RIGHT HEART CATH N/A 07/12/2017   Procedure: RIGHT HEART CATH;  Surgeon: [name], MD;  Location: [place] CV LAB;  Service: Cardiovascular;  Laterality: N/A;  . TONSILLECTOMY    . UPPER GASTROINTESTINAL ENDOSCOPY  03/15/2011   EGD ED BANDING/TCS  . UPPER GASTROINTESTINAL ENDOSCOPY  09/05/2010  . UPPER GASTROINTESTINAL ENDOSCOPY  08/11/2010  . VAGINAL HYSTERECTOMY       OB History   No obstetric history on file.      Home Medications    Prior to Admission medications   Medication Sig Start Date End Date Taking? Authorizing Provider  bumetanide (BUMEX) 2 MG tablet Take 2 mg by mouth 2 (two) times daily.   Yes [provider]  busPIRone (BUSPAR) 10 MG tablet Take 10 mg by mouth 2 (two) times daily.   Yes [provider]  cholecalciferol (VITAMIN D) 1000 UNITS tablet Take 1,000 Units by mouth daily.    Yes [provider]  ergocalciferol (VITAMIN D2) 1.25 MG (50000 UT) capsule Take 1 tablet by mouth every other day.   Yes [provider]  HUMALOG 100 UNIT/ML injection Inject 15-35 Units into the skin 3 (three) times daily with meals. Sliding scale insulin 100-150= 15 units 150-200= 20 units 250-300= 30 units 300-350= 35 units 07/01/18  Yes [provider]  Insulin Glargine (LANTUS SOLOSTAR) 100 UNIT/ML Solostar Pen Inject 10-55 Units into the skin See admin instructions. Inject 55 units subcutaneously in the morning & 10 units subcutaneously at night. 03/06/19  Yes [provider]  lactulose (CHRONULAC) 10 GM/15ML solution TAKE 30 ML BY MOUTH  4 TIMES DAILY 08/04/19  Yes Rehman, Mechele Dawley, MD  lidocaine-prilocaine (EMLA) cream Apply 1 application topically daily as needed. 08/19/19  Yes [provider]  magnesium oxide (MAG-OX) 400 MG tablet Take 400 mg by mouth daily.    Yes [provider]  pantoprazole (PROTONIX) 40 MG tablet Take 1 tablet (40 mg total) by mouth 2 (two) times daily before a meal. 06/03/19  Yes Emokpae, Courage, MD  Pediatric Multivitamins-Iron (FLINTSTONES PLUS IRON PO) Take 2 tablets by mouth daily.   Yes [provider]  potassium chloride (K-DUR) 20 MEQ tablet Take 2 tablets (40 mEq total) by mouth daily. 06/03/19  Yes Emokpae, Courage, MD  rifaximin (XIFAXAN) 550 MG TABS tablet Take 1 tablet (550 mg total) by mouth 2 (two) times daily. 02/27/19  Yes Barton Dubois, MD  rosuvastatin (CRESTOR) 5 MG tablet Take 5 mg by mouth every other day.   Yes [provider]  sertraline (ZOLOFT) 50 MG tablet Take 50 mg by mouth daily.  03/10/19  Yes [provider]  spironolactone (ALDACTONE) 50 MG tablet Take 1 tablet by  mouth twice daily Patient taking differently: 100 mg once. K+ sparing diuretic: Hyperkalemia may occur with decreased renal function 06/05/19  Yes Rehman, Mechele Dawley, MD  zinc sulfate 220 (50 Zn) MG capsule Take 1 capsule (220 mg total) by mouth daily. 08/01/18  Yes Barton Dubois, MD  atenolol (TENORMIN) 25 MG tablet Take 0.5 tablets (12.5 mg total) by mouth daily. 06/02/19 06/01/20  Roxan Hockey, MD  Insulin Pen Needle 31G X 5 MM MISC Use with each insulin injection 07/22/17   [name], [name], MD  levothyroxine (SYNTHROID, LEVOTHROID) 25 MCG tablet Take 25 mcg by mouth daily before breakfast.     [provider]  sucralfate (CARAFATE) 1 GM/10ML suspension Take 1 g by mouth 4 (four) times daily.  04/04/19 10/01/19  [provider]  torsemide (DEMADEX) 20 MG tablet Take 1 tablet by mouth once daily 06/05/19   [name], MD  trimethoprim-polymyxin b [facility]) ophthalmic solution Place 1 drop into the left eye See admin instructions. Use 3 to 4 times daily for 2 days following monthly eye injection 05/06/18   [provider]    Family History Family History  Problem Relation Age of Onset  . Lung cancer Mother   . Diabetes Father   . Diabetes Sister   . Hypertension Sister   . Hypothyroidism Sister   . Colon cancer Brother   . Diabetes Sister   . Hypothyroidism Brother   . Healthy Daughter   . Obesity Daughter   . Hypertension Daughter     Social History Social History   Tobacco Use  . Smoking status: Never Smoker  . Smokeless tobacco: Never Used  Substance Use Topics  . Alcohol use: No    Alcohol/week: 0.0 standard drinks  . Drug use: No     Allergies   Ferumoxytol and Tramadol hcl   Review of Systems Review of Systems  Unable to perform ROS: Other     Physical Exam Updated Vital Signs BP (!) 136/44 (BP Location: Right Arm)   Pulse 90   Temp 97.7 F (36.5 C) (Oral)   Resp 16   Ht 5' 4"  (1.626 m)   Wt 74.8 kg   SpO2 98%    BMI 28.32 kg/m   Physical Exam Vitals signs and nursing note reviewed.  Constitutional:      Appearance: She is well-developed.     Comments: No respiratory distress.  Answers questions with simple response.  HENT:     Head: Normocephalic and atraumatic.  Eyes:     Conjunctiva/sclera: Conjunctivae normal.  Neck:     Musculoskeletal: Neck supple.  Cardiovascular:     Rate and  Rhythm: Normal rate and regular rhythm.  Pulmonary:     Effort: Pulmonary effort is normal.     Breath sounds: Normal breath sounds.  Abdominal:     General: Bowel sounds are normal.     Palpations: Abdomen is soft.  Musculoskeletal: Normal range of motion.  Skin:    General: Skin is warm and dry.     Comments: Slight jaundice noted  Neurological:     General: No focal deficit present.     Mental Status: She is oriented to person, place, and time.     Comments: Moving all arms and legs.  Psychiatric:     Comments: Flat affect      ED Treatments / Results  Labs (all labs ordered are listed, but only abnormal results are displayed) Labs Reviewed  CBC WITH DIFFERENTIAL/PLATELET - Abnormal; Notable for the following components:      Result Value   RBC 3.55 (*)    Hemoglobin 8.1 (*)    HCT 28.8 (*)    MCH 22.8 (*)    MCHC 28.1 (*)    RDW 18.5 (*)    Platelets 56 (*)    All other components within normal limits  COMPREHENSIVE METABOLIC PANEL - Abnormal; Notable for the following components:   Sodium 134 (*)    CO2 20 (*)    Glucose, Bld 170 (*)    BUN 32 (*)    Creatinine, Ser 1.56 (*)    Total Protein 6.1 (*)    Albumin 2.9 (*)    Total Bilirubin 2.9 (*)    GFR calc non Af Amer 35 (*)    GFR calc Af Amer 40 (*)    All other components within normal limits  AMMONIA - Abnormal; Notable for the following components:   Ammonia 40 (*)    All other components within normal limits  URINALYSIS, ROUTINE W REFLEX MICROSCOPIC - Abnormal; Notable for the following components:   Hgb urine  dipstick MODERATE (*)    Bacteria, UA FEW (*)    All other components within normal limits  SARS CORONAVIRUS 2 (TAT 6-24 HRS)  TYPE AND SCREEN    EKG None  Radiology Dg Chest Port 1 View  Result Date: 08/25/2019 CLINICAL DATA:  Weakness EXAM: PORTABLE CHEST 1 VIEW COMPARISON:  06/02/2019 FINDINGS: There is a right-sided Port-A-Cath in satisfactory position. There is no focal consolidation. There is no pleural effusion or pneumothorax. The heart and mediastinal contours are unremarkable. There is no acute osseous abnormality. IMPRESSION: No active disease. Electronically Signed   By: Kathreen Devoid   On: 08/25/2019 13:37    Procedures Procedures (including critical care time)  Medications Ordered in ED Medications - No data to display   Initial Impression / Assessment and Plan / ED Course  I have reviewed the triage vital signs and the nursing notes.  Pertinent labs & imaging results that were available during my care of the patient were reviewed by me and considered in my medical decision making (see chart for details).        Patient with end-stage renal disease presents with altered mental status.  Ammonia level minimally elevated.  Hemoglobin 8.1.  No evidence of a urinary tract infection.  Coronavirus pending.  Stable for outpatient management.  Encouraged to follow-up with Dr. Laural Golden next day.  Maria [name] was evaluated in Emergency Department on 08/26/2019 for the symptoms described in the history of present illness. She was evaluated in the context of the [place] COVID-19  pandemic, which necessitated consideration that the patient might be at risk for infection with the SARS-CoV-2 virus that causes COVID-19. Institutional protocols and algorithms that pertain to the evaluation of patients at risk for COVID-19 are in a state of rapid change based on information released by regulatory bodies including the CDC and federal and state organizations. These policies and algorithms  were followed during the patient's care in the ED.  Final Clinical Impressions(s) / ED Diagnoses   Final diagnoses:  Altered mental status, unspecified altered mental status type  End stage liver disease [facility])    ED Discharge Orders    None       Nat Christen, MD 08/26/19 [phone]

## 2019-08-27 ENCOUNTER — Inpatient Hospital Stay (HOSPITAL_COMMUNITY)
Admission: EM | Admit: 2019-08-27 | Discharge: 2019-09-02 | DRG: 441 | Disposition: A | Discharge status: Home Health | Payer: PPO | Attending: Hospitalist | Admitting: Hospitalist

## 2019-08-27 ENCOUNTER — Other Ambulatory Visit: Payer: Self-pay

## 2019-08-27 ENCOUNTER — Encounter (HOSPITAL_COMMUNITY): Payer: Self-pay | Admitting: Emergency Medicine

## 2019-08-27 ENCOUNTER — Telehealth (INDEPENDENT_AMBULATORY_CARE_PROVIDER_SITE_OTHER): Payer: Self-pay | Admitting: *Deleted

## 2019-08-27 ENCOUNTER — Emergency Department (HOSPITAL_COMMUNITY): Payer: PPO

## 2019-08-27 DIAGNOSIS — M1990 Unspecified osteoarthritis, unspecified site: Secondary | ICD-10-CM | POA: Diagnosis present

## 2019-08-27 DIAGNOSIS — E039 Hypothyroidism, unspecified: Secondary | ICD-10-CM | POA: Diagnosis present

## 2019-08-27 DIAGNOSIS — J9611 Chronic respiratory failure with hypoxia: Secondary | ICD-10-CM | POA: Diagnosis present

## 2019-08-27 DIAGNOSIS — K7460 Unspecified cirrhosis of liver: Secondary | ICD-10-CM | POA: Diagnosis present

## 2019-08-27 DIAGNOSIS — I110 Hypertensive heart disease with heart failure: Secondary | ICD-10-CM | POA: Diagnosis present

## 2019-08-27 DIAGNOSIS — Z885 Allergy status to narcotic agent status: Secondary | ICD-10-CM

## 2019-08-27 DIAGNOSIS — Z888 Allergy status to other drugs, medicaments and biological substances status: Secondary | ICD-10-CM

## 2019-08-27 DIAGNOSIS — Z800 Family history of malignant neoplasm of digestive organs: Secondary | ICD-10-CM

## 2019-08-27 DIAGNOSIS — I2510 Atherosclerotic heart disease of native coronary artery without angina pectoris: Secondary | ICD-10-CM | POA: Diagnosis present

## 2019-08-27 DIAGNOSIS — M810 Age-related osteoporosis without current pathological fracture: Secondary | ICD-10-CM | POA: Diagnosis present

## 2019-08-27 DIAGNOSIS — U071 COVID-19: Secondary | ICD-10-CM | POA: Diagnosis present

## 2019-08-27 DIAGNOSIS — R4182 Altered mental status, unspecified: Secondary | ICD-10-CM | POA: Diagnosis present

## 2019-08-27 DIAGNOSIS — E119 Type 2 diabetes mellitus without complications: Secondary | ICD-10-CM | POA: Diagnosis present

## 2019-08-27 DIAGNOSIS — K7581 Nonalcoholic steatohepatitis (NASH): Secondary | ICD-10-CM | POA: Diagnosis present

## 2019-08-27 DIAGNOSIS — I509 Heart failure, unspecified: Secondary | ICD-10-CM | POA: Diagnosis present

## 2019-08-27 DIAGNOSIS — K7200 Acute and subacute hepatic failure without coma: Principal | ICD-10-CM | POA: Diagnosis present

## 2019-08-27 DIAGNOSIS — D649 Anemia, unspecified: Secondary | ICD-10-CM | POA: Diagnosis not present

## 2019-08-27 DIAGNOSIS — F419 Anxiety disorder, unspecified: Secondary | ICD-10-CM | POA: Diagnosis present

## 2019-08-27 DIAGNOSIS — M797 Fibromyalgia: Secondary | ICD-10-CM | POA: Diagnosis present

## 2019-08-27 DIAGNOSIS — K7290 Hepatic failure, unspecified without coma: Secondary | ICD-10-CM | POA: Diagnosis not present

## 2019-08-27 DIAGNOSIS — Z801 Family history of malignant neoplasm of trachea, bronchus and lung: Secondary | ICD-10-CM

## 2019-08-27 DIAGNOSIS — K7682 Hepatic encephalopathy: Secondary | ICD-10-CM

## 2019-08-27 DIAGNOSIS — K31819 Angiodysplasia of stomach and duodenum without bleeding: Secondary | ICD-10-CM | POA: Diagnosis present

## 2019-08-27 DIAGNOSIS — N179 Acute kidney failure, unspecified: Secondary | ICD-10-CM | POA: Diagnosis present

## 2019-08-27 DIAGNOSIS — F329 Major depressive disorder, single episode, unspecified: Secondary | ICD-10-CM | POA: Diagnosis present

## 2019-08-27 DIAGNOSIS — Z794 Long term (current) use of insulin: Secondary | ICD-10-CM

## 2019-08-27 DIAGNOSIS — Z8249 Family history of ischemic heart disease and other diseases of the circulatory system: Secondary | ICD-10-CM

## 2019-08-27 DIAGNOSIS — Z833 Family history of diabetes mellitus: Secondary | ICD-10-CM

## 2019-08-27 DIAGNOSIS — E876 Hypokalemia: Secondary | ICD-10-CM | POA: Diagnosis present

## 2019-08-27 DIAGNOSIS — E7800 Pure hypercholesterolemia, unspecified: Secondary | ICD-10-CM | POA: Diagnosis present

## 2019-08-27 DIAGNOSIS — K219 Gastro-esophageal reflux disease without esophagitis: Secondary | ICD-10-CM | POA: Diagnosis present

## 2019-08-27 DIAGNOSIS — Z79890 Hormone replacement therapy (postmenopausal): Secondary | ICD-10-CM

## 2019-08-27 DIAGNOSIS — K5900 Constipation, unspecified: Secondary | ICD-10-CM | POA: Diagnosis present

## 2019-08-27 DIAGNOSIS — D500 Iron deficiency anemia secondary to blood loss (chronic): Secondary | ICD-10-CM | POA: Diagnosis present

## 2019-08-27 LAB — CBC WITH DIFFERENTIAL/PLATELET
Abs Immature Granulocytes: 0.01 10*3/uL (ref 0.00–0.07)
Basophils Absolute: 0 10*3/uL (ref 0.0–0.1)
Basophils Relative: 0 %
Eosinophils Absolute: 0 10*3/uL (ref 0.0–0.5)
Eosinophils Relative: 1 %
HCT: 27.4 % — ABNORMAL LOW (ref 36.0–46.0)
Hemoglobin: 7.4 g/dL — ABNORMAL LOW (ref 12.0–15.0)
Immature Granulocytes: 0 %
Lymphocytes Relative: 20 %
Lymphs Abs: 0.9 10*3/uL (ref 0.7–4.0)
MCH: 22.7 pg — ABNORMAL LOW (ref 26.0–34.0)
MCHC: 27 g/dL — ABNORMAL LOW (ref 30.0–36.0)
MCV: 84 fL (ref 80.0–100.0)
Monocytes Absolute: 0.5 10*3/uL (ref 0.1–1.0)
Monocytes Relative: 11 %
Neutro Abs: 3.1 10*3/uL (ref 1.7–7.7)
Neutrophils Relative %: 68 %
Platelets: 50 10*3/uL — ABNORMAL LOW (ref 150–400)
RBC: 3.26 MIL/uL — ABNORMAL LOW (ref 3.87–5.11)
RDW: 18.6 % — ABNORMAL HIGH (ref 11.5–15.5)
WBC: 4.6 10*3/uL (ref 4.0–10.5)
nRBC: 0.4 % — ABNORMAL HIGH (ref 0.0–0.2)

## 2019-08-27 LAB — COMPREHENSIVE METABOLIC PANEL
ALT: 28 U/L (ref 0–44)
AST: 34 U/L (ref 15–41)
Albumin: 2.7 g/dL — ABNORMAL LOW (ref 3.5–5.0)
Alkaline Phosphatase: 90 U/L (ref 38–126)
Anion gap: 14 (ref 5–15)
BUN: 38 mg/dL — ABNORMAL HIGH (ref 8–23)
CO2: 15 mmol/L — ABNORMAL LOW (ref 22–32)
Calcium: 9.9 mg/dL (ref 8.9–10.3)
Chloride: 107 mmol/L (ref 98–111)
Creatinine, Ser: 1.85 mg/dL — ABNORMAL HIGH (ref 0.44–1.00)
GFR calc Af Amer: 33 mL/min — ABNORMAL LOW (ref >60)
GFR calc non Af Amer: 28 mL/min — ABNORMAL LOW (ref >60)
Glucose, Bld: 114 mg/dL — ABNORMAL HIGH (ref 70–99)
Potassium: 3.7 mmol/L (ref 3.5–5.1)
Sodium: 136 mmol/L (ref 135–145)
Total Bilirubin: 2.6 mg/dL — ABNORMAL HIGH (ref 0.3–1.2)
Total Protein: 5.4 g/dL — ABNORMAL LOW (ref 6.5–8.1)

## 2019-08-27 LAB — URINALYSIS, ROUTINE W REFLEX MICROSCOPIC
Bilirubin Urine: NEGATIVE
Glucose, UA: NEGATIVE mg/dL
Ketones, ur: NEGATIVE mg/dL
Leukocytes,Ua: NEGATIVE
Nitrite: NEGATIVE
Protein, ur: NEGATIVE mg/dL
Specific Gravity, Urine: 1.017 (ref 1.005–1.030)
pH: 5 (ref 5.0–8.0)

## 2019-08-27 LAB — AMMONIA: Ammonia: 87 umol/L — ABNORMAL HIGH (ref 9–35)

## 2019-08-27 LAB — CBG MONITORING, ED
Glucose-Capillary: 65 mg/dL — ABNORMAL LOW (ref 70–99)
Glucose-Capillary: 106 mg/dL — ABNORMAL HIGH (ref 70–99)

## 2019-08-27 LAB — POC OCCULT BLOOD, ED: Fecal Occult Bld: NEGATIVE

## 2019-08-27 LAB — GLUCOSE, CAPILLARY: Glucose-Capillary: 175 mg/dL — ABNORMAL HIGH (ref 70–99)

## 2019-08-27 MED ORDER — LEVOTHYROXINE SODIUM 50 MCG PO TABS
25.0000 ug | ORAL_TABLET | Freq: Every day | ORAL | Status: DC
  Administered 2019-08-28 – 2019-09-02 (×6): 25 ug via ORAL
  Filled 2019-08-27 (×6): qty 1

## 2019-08-27 MED ORDER — INSULIN ASPART 100 UNIT/ML ~~LOC~~ SOLN
0.0000 [IU] | Freq: Three times a day (TID) | SUBCUTANEOUS | Status: DC
  Administered 2019-08-28: 2 [IU] via SUBCUTANEOUS
  Administered 2019-08-28: 3 [IU] via SUBCUTANEOUS
  Administered 2019-08-29 (×3): 2 [IU] via SUBCUTANEOUS
  Administered 2019-08-30: 1 [IU] via SUBCUTANEOUS
  Administered 2019-08-30: 3 [IU] via SUBCUTANEOUS
  Administered 2019-08-30 – 2019-08-31 (×2): 2 [IU] via SUBCUTANEOUS
  Administered 2019-08-31: 3 [IU] via SUBCUTANEOUS
  Administered 2019-08-31: 1 [IU] via SUBCUTANEOUS
  Administered 2019-09-01 (×2): 3 [IU] via SUBCUTANEOUS
  Administered 2019-09-01: 2 [IU] via SUBCUTANEOUS
  Administered 2019-09-02: 5 [IU] via SUBCUTANEOUS
  Administered 2019-09-02: 1 [IU] via SUBCUTANEOUS

## 2019-08-27 MED ORDER — PANTOPRAZOLE SODIUM 40 MG PO TBEC
40.0000 mg | DELAYED_RELEASE_TABLET | Freq: Two times a day (BID) | ORAL | Status: DC
  Administered 2019-08-27 – 2019-09-02 (×12): 40 mg via ORAL
  Filled 2019-08-27 (×12): qty 1

## 2019-08-27 MED ORDER — LACTULOSE 10 GM/15ML PO SOLN
30.0000 g | Freq: Four times a day (QID) | ORAL | Status: DC
  Administered 2019-08-27 – 2019-08-29 (×7): 30 g via ORAL
  Filled 2019-08-27 (×7): qty 60

## 2019-08-27 MED ORDER — RIFAXIMIN 550 MG PO TABS
550.0000 mg | ORAL_TABLET | Freq: Two times a day (BID) | ORAL | Status: DC
  Administered 2019-08-27 – 2019-09-02 (×12): 550 mg via ORAL
  Filled 2019-08-27 (×12): qty 1

## 2019-08-27 MED ORDER — INSULIN ASPART 100 UNIT/ML ~~LOC~~ SOLN: 0.0000 [IU] | Freq: Every day | SUBCUTANEOUS | Status: DC

## 2019-08-27 MED ORDER — ATENOLOL 25 MG PO TABS
12.5000 mg | ORAL_TABLET | Freq: Every day | ORAL | Status: DC
  Administered 2019-08-28 – 2019-09-02 (×6): 12.5 mg via ORAL
  Filled 2019-08-27 (×6): qty 1

## 2019-08-27 MED ORDER — SODIUM CHLORIDE 0.9 % IV SOLN
Freq: Once | INTRAVENOUS | Status: AC
  Administered 2019-08-27: 14:00:00 via INTRAVENOUS

## 2019-08-27 NOTE — ED Notes (Signed)
Unsuccessful In and Out. Have attached a pure wick

## 2019-08-27 NOTE — Telephone Encounter (Signed)
Maria [name] called the office this morning. He states that Maria [name] is back in the floor. I ask if she was ok , and he states that the patient is not alert or orientated. I ask that he call 911 immediately. He had questions about if he brought her to [facility]ED or [facility] as they are part of [facility] . Then he mentioned that Maria [name] had appointment with Dr.[name] today and he may call him.  I ask that he please call 911.  Dr.Rehman has been made aware..  Returned call to [facility] , patient is now sitting up in floor holding on to her walker, know her name and where she is.   I advised that she needed to be seen and to call 911. He Maria [name]) then ask about [facility] and taking her to that ED. Since she appeared to be doing better.I ask if he had someone to go with hin and he said that he did.  Then he wanted to call Trish the coordinator of the [facility] at [facility]. I told him that I would and for him to get Maria [name] help.  Call made to [place] , she did not feel that he should bring her to the ED at [facility]. She was made aware of the recent  visit Monday to the ED/[facility].  She was made aware that she could look in care everywhere and see results of labs,ect.  After reviewing the lab work she stated that she would call Maria [name] and ask him to call 911 and get her to the nearest ED.  Maria [name] called me back and stated that she was back in the floor , he is calling 911 and having her brought to [facility]?ED.

## 2019-08-27 NOTE — ED Triage Notes (Signed)
Pt slipped out of bed this am. Husband states pt is confused. HX of cirrhosis.

## 2019-08-27 NOTE — ED Provider Notes (Signed)
[facility] EMERGENCY DEPARTMENT Provider Note   CSN: [id number] Arrival date & time: 08/27/19  1108     History   Chief Complaint Chief Complaint  Patient presents with  . Fall    HPI ECHO PROPP is a 63 y.o. Maria [name].     HPI   63 year old Maria [name], with a PMH of cirrhosis, CHF, diabetes, HTN, HLD, presents with altered mental status.  Per EMS report patient slid out of bed this morning.  Husband reported patient has been more confused at home.  Husband noted that this is how she usually acts when her ammonia level is elevated.  She was seen and evaluated 2 days ago for altered mental status and had a normal work-up at that time.  She was tested for COVID-19 which was negative.  No known fevers, cough, vomiting, diarrhea.  Patient only answers her name.  Past Medical History:  Diagnosis Date  . Anemia   . CHF (congestive heart failure) ([place])   . Cirrhosis ([place])   . Depression   . Diabetes mellitus   . Esophageal bleed, non-variceal   . Eye hemorrhage    Behind left eye  . Fibromyalgia   . Hypercholesteremia   . Hypertension   . Hypothyroidism   . NAFLD (nonalcoholic fatty liver disease)   . Osteoarthrosis   . Osteoporosis   . PONV (postoperative nausea and vomiting)   . UTI (urinary tract infection) 11/12 end of the month   Patient feels that she passed a kidney stone at that time    Patient Active Problem List   Diagnosis Date Noted  . Hepatic encephalopathy ([place]) 08/27/2019  . Altered mental status 02/27/2019  . Bacteriuria 02/27/2019  . Acute on chronic anemia 02/24/2019  . Acute hepatic encephalopathy 07/26/2018  . Obesity, Class III, BMI 40-49.9 (morbid obesity) ([place])   . Melena   . Anemia 07/02/2018  . UGI bleed 11/09/2017  . Insulin dependent diabetes mellitus   . Absolute anemia 09/13/2017  . Idiopathic esophageal varices with bleeding ([place]) 09/13/2017  . Other cirrhosis of liver ([place]) 08/22/2017  . NAFLD (nonalcoholic fatty liver disease)  08/14/2017  . Encephalopathy, hepatic ([place]) 08/14/2017  . Metabolic encephalopathy 12/87/8676  . Hyperammonemia ([place]) 07/18/2017  . Hypothyroidism 07/18/2017  . Acute on chronic right-sided congestive heart failure ([place]) 07/06/2017  . Pulmonary hypertension ([place]) 07/06/2017  . GAVE (gastric antral vascular ectasia) 07/06/2017  . GI bleeding 07/06/2017  . Symptomatic anemia 07/04/2017  . Hypokalemia 07/04/2017  . Chronic diastolic heart failure ([place]) 06/11/2017  . CAD (coronary artery disease) 06/11/2017  . Nocturnal hypoxemia 02/22/2017  . Idiopathic esophageal varices without bleeding ([place]) 01/31/2017  . Dyspnea and respiratory abnormalities 01/25/2017  . Esophageal varices in cirrhosis ([place]) 05/19/2016  . Hepatic cirrhosis ([place]) 05/19/2016  . Esophageal varices ([place]) 12/09/2012  . Dysphagia, unspecified(787.20) 12/09/2012  . Anxiety 08/26/2012  . Cirrhosis, nonalcoholic ([place]) 72/05/4708  . Diabetes mellitus ([place]) 10/09/2011  . History of esophageal varices 10/09/2011  . Iron deficiency anemia 10/09/2011  . GERD (gastroesophageal reflux disease) 10/09/2011  . Thrombocytopenia ([place]) 10/09/2011    Past Surgical History:  Procedure Laterality Date  . APPENDECTOMY  1980  . BILATERAL SALPINGOOPHORECTOMY    . CHOLECYSTECTOMY    . COLONOSCOPY  03/15/2011  . COLONOSCOPY N/A 03/24/2016   Procedure: COLONOSCOPY;  Surgeon: [name], MD;  [facility];  Service: Endoscopy;  Laterality: N/A;  855  . ESOPHAGEAL BANDING  04/24/2012   Procedure: ESOPHAGEAL BANDING;  Surgeon: Bernadene Person  Gloriann Loan, MD;  Location: [facility];  Service: Endoscopy;  Laterality: N/A;  . Esophageal BANDING  08/03/2012   [facility] in [place] , [place]  09/24/2012   Procedure: ESOPHAGEAL BANDING;  Surgeon: [name], MD;  Location: [facility];  Service: Endoscopy;  Laterality: N/A;  Banding x 3  . ESOPHAGEAL BANDING N/A 01/29/2013   Procedure: ESOPHAGEAL BANDING;   Surgeon: [name], MD;  [facility];  Service: Endoscopy;  Laterality: N/A;  . ESOPHAGEAL BANDING N/A 06/19/2014   Procedure: ESOPHAGEAL BANDING;  Surgeon: [name], MD;  [facility];  Service: Endoscopy;  Laterality: N/A;  . ESOPHAGEAL BANDING N/A 01/05/2016   Procedure: ESOPHAGEAL BANDING;  Surgeon: [name], MD;  [facility];  Service: Endoscopy;  Laterality: N/A;  . ESOPHAGEAL BANDING N/A 08/03/2016   Procedure: ESOPHAGEAL BANDING;  Surgeon: [name], MD;  [facility];  Service: Endoscopy;  Laterality: N/A;  . ESOPHAGEAL BANDING N/A 05/02/2017   Procedure: ESOPHAGEAL BANDING;  Surgeon: [name], MD;  [facility];  Service: Endoscopy;  Laterality: N/A;  . ESOPHAGOGASTRODUODENOSCOPY  04/24/2012   Procedure: ESOPHAGOGASTRODUODENOSCOPY (EGD);  Surgeon: [name], MD;  [facility];  Service: Endoscopy;  Laterality: N/A;  300  . ESOPHAGOGASTRODUODENOSCOPY N/A 01/29/2013   Procedure: ESOPHAGOGASTRODUODENOSCOPY (EGD);  Surgeon: [name], MD;  [facility];  Service: Endoscopy;  Laterality: N/A;  1200  . ESOPHAGOGASTRODUODENOSCOPY N/A 05/22/2013   Procedure: ESOPHAGOGASTRODUODENOSCOPY (EGD);  Surgeon: [name], MD;  [facility];  Service: Endoscopy;  Laterality: N/A;  1:55  . ESOPHAGOGASTRODUODENOSCOPY N/A 12/31/2013   Procedure: ESOPHAGOGASTRODUODENOSCOPY (EGD);  Surgeon: [name], MD;  [facility];  Service: Endoscopy;  Laterality: N/A;  1200  . ESOPHAGOGASTRODUODENOSCOPY N/A 06/19/2014   Procedure: ESOPHAGOGASTRODUODENOSCOPY (EGD);  Surgeon: [name], MD;  [facility];  Service: Endoscopy;  Laterality: N/A;  1055  . ESOPHAGOGASTRODUODENOSCOPY N/A 01/15/2015   Procedure: ESOPHAGOGASTRODUODENOSCOPY (EGD);  Surgeon: [name], MD;  [facility];  Service: Endoscopy;  Laterality: N/A;  730 - moved to 9:45 -  moved to 1250-Ann notified pt  . ESOPHAGOGASTRODUODENOSCOPY N/A 06/30/2015   Procedure: ESOPHAGOGASTRODUODENOSCOPY (EGD);  Surgeon: [name], MD;  [facility];  Service: Endoscopy;  Laterality: N/A;  1200  . ESOPHAGOGASTRODUODENOSCOPY N/A 01/05/2016   Procedure: ESOPHAGOGASTRODUODENOSCOPY (EGD);  Surgeon: [name], MD;  [facility];  Service: Endoscopy;  Laterality: N/A;  830  . ESOPHAGOGASTRODUODENOSCOPY N/A 08/03/2016   Procedure: ESOPHAGOGASTRODUODENOSCOPY (EGD);  Surgeon: [name], MD;  [facility];  Service: Endoscopy;  Laterality: N/A;  930  . ESOPHAGOGASTRODUODENOSCOPY N/A 05/02/2017   Procedure: ESOPHAGOGASTRODUODENOSCOPY (EGD);  Surgeon: [name], MD;  [facility];  Service: Endoscopy;  Laterality: N/A;  12:00  . ESOPHAGOGASTRODUODENOSCOPY N/A 08/17/2017   Procedure: ESOPHAGOGASTRODUODENOSCOPY (EGD);  Surgeon: [name], MD;  [facility];  Service: Endoscopy;  Laterality: N/A;  8:15  . ESOPHAGOGASTRODUODENOSCOPY N/A 09/12/2017   Procedure: ESOPHAGOGASTRODUODENOSCOPY (EGD);  Surgeon: [name], MD;  [facility];  Service: Endoscopy;  Laterality: N/A;  1040  . ESOPHAGOGASTRODUODENOSCOPY N/A 10/10/2017   Procedure: ESOPHAGOGASTRODUODENOSCOPY (EGD);  Surgeon: [name], MD;  [facility];  Service: Endoscopy;  Laterality: N/A;  225  . ESOPHAGOGASTRODUODENOSCOPY N/A 11/09/2017   Procedure: ESOPHAGOGASTRODUODENOSCOPY (EGD);  Surgeon: [name], MD;  [facility];  Service: Endoscopy;  Laterality: N/A;  . ESOPHAGOGASTRODUODENOSCOPY (EGD) WITH PROPOFOL  09/24/2012   Procedure: ESOPHAGOGASTRODUODENOSCOPY (EGD) WITH PROPOFOL;  Surgeon: [name], MD;  Location: [facility];  Service: Endoscopy;  Laterality: N/A;  GE junction at 36  . ESOPHAGOGASTRODUODENOSCOPY (EGD) WITH PROPOFOL N/A 07/06/2017   Procedure: ESOPHAGOGASTRODUODENOSCOPY (EGD) WITH PROPOFOL;   Surgeon: [name], MD;  [facility];  Service: Endoscopy;  Laterality: N/A;  . ESOPHAGOGASTRODUODENOSCOPY W/ BANDING  08/2010  . GIVENS CAPSULE STUDY N/A 07/05/2017   Procedure: GIVENS CAPSULE STUDY;  Surgeon: [name], MD;  [facility];  Service: Endoscopy;  Laterality: N/A;  . HOT HEMOSTASIS N/A 08/17/2017   Procedure: HOT HEMOSTASIS (ARGON PLASMA COAGULATION/BICAP);  Surgeon: [name], MD;  [facility];  Service: Endoscopy;  Laterality: N/A;  . HOT HEMOSTASIS  09/12/2017   Procedure: HOT HEMOSTASIS (ARGON PLASMA COAGULATION/BICAP);  Surgeon: [name], MD;  [facility];  Service: Endoscopy;;  gastric  . HOT HEMOSTASIS  10/10/2017   Procedure: HOT HEMOSTASIS (ARGON PLASMA COAGULATION/BICAP);  Surgeon: [name], MD;  [facility];  Service: Endoscopy;;  . POLYPECTOMY  03/24/2016   Procedure: POLYPECTOMY;  Surgeon: [name], MD;  [facility];  Service: Endoscopy;;  sigmoid polyp  . PORTACATH PLACEMENT Right 06/02/2019   Procedure: INSERTION PORT-A-CATH;  Surgeon: Aviva Signs, MD;  Location: [facility];  Service: General;  Laterality: Right;  . RIGHT HEART CATH N/A 04/16/2017   Procedure: Right Heart Cath;  Surgeon: [name], MD;  Location: [place] [facility];  Service: Cardiovascular;  Laterality: N/A;  . RIGHT HEART CATH N/A 07/12/2017   Procedure: RIGHT HEART CATH;  Surgeon: [name], MD;  Location: [place] [facility];  Service: Cardiovascular;  Laterality: N/A;  . TONSILLECTOMY    . UPPER GASTROINTESTINAL ENDOSCOPY  03/15/2011   EGD ED BANDING/TCS  . UPPER GASTROINTESTINAL ENDOSCOPY  09/05/2010  . UPPER GASTROINTESTINAL ENDOSCOPY  08/11/2010  . VAGINAL HYSTERECTOMY       OB History   No obstetric history on file.      Home Medications    Prior to Admission medications   Medication Sig Start Date End Date Taking? Authorizing Provider  atenolol (TENORMIN)  25 MG tablet Take 0.5 tablets (12.5 mg total) by mouth daily. 06/02/19 06/01/20 Yes Emokpae, Courage, MD  bumetanide (BUMEX) 2 MG tablet Take 2 mg by mouth 2 (two) times daily.   Yes [provider]  busPIRone (BUSPAR) 10 MG tablet Take 10 mg by mouth 2 (two) times daily.   Yes [provider]  cholecalciferol (VITAMIN D) 1000 UNITS tablet Take 1,000 Units by mouth daily.    Yes [provider]  ergocalciferol (VITAMIN D2) 1.25 MG (50000 UT) capsule Take 1 tablet by mouth every other day.   Yes [provider]  HUMALOG 100 UNIT/ML injection Inject 15-35 Units into the skin 3 (three) times daily with meals. Sliding scale insulin 100-150= 15 units 150-200= 20 units 250-300= 30 units 300-350= 35 units 07/01/18  Yes [provider]  Insulin Glargine (LANTUS SOLOSTAR) 100 UNIT/ML Solostar Pen Inject 10-55 Units into the skin See admin instructions. Inject 55 units subcutaneously in the morning & 10 units subcutaneously at night. 03/06/19  Yes [provider]  Insulin Pen Needle 31G X 5 MM MISC Use with each insulin injection 07/22/17  Yes Jerilee Hoh  Everardo Beals, MD  lactulose (CHRONULAC) 10 GM/15ML solution TAKE 30 ML BY MOUTH  4 TIMES DAILY 08/04/19  Yes Rehman, Mechele Dawley, MD  levothyroxine (SYNTHROID, LEVOTHROID) 25 MCG tablet Take 25 mcg by mouth daily before breakfast.    Yes [provider]  lidocaine-prilocaine (EMLA) cream Apply 1 application topically daily as needed. 08/19/19  Yes [provider]  magnesium oxide (MAG-OX) 400 MG tablet Take 400 mg by mouth daily.    Yes [provider]  pantoprazole (PROTONIX) 40 MG tablet Take 1 tablet (40 mg total) by mouth 2 (two) times daily before a meal. 06/03/19  Yes Emokpae, Courage, MD  Pediatric Multivitamins-Iron (FLINTSTONES PLUS IRON PO) Take 2 tablets by mouth daily.   Yes [provider]  potassium chloride (K-DUR) 20 MEQ tablet Take 2 tablets (40 mEq total) by  mouth daily. 06/03/19  Yes Emokpae, Courage, MD  rifaximin (XIFAXAN) 550 MG TABS tablet Take 1 tablet (550 mg total) by mouth 2 (two) times daily. 02/27/19  Yes Barton Dubois, MD  rosuvastatin (CRESTOR) 5 MG tablet Take 5 mg by mouth every other day.   Yes [provider]  sertraline (ZOLOFT) 50 MG tablet Take 50 mg by mouth daily.  03/10/19  Yes [provider]  spironolactone (ALDACTONE) 50 MG tablet Take 1 tablet by mouth twice daily Patient taking differently: 100 mg once. K+ sparing diuretic: Hyperkalemia may occur with decreased renal function 06/05/19  Yes Rehman, Mechele Dawley, MD  sucralfate (CARAFATE) 1 GM/10ML suspension Take 1 g by mouth 4 (four) times daily.  04/04/19 10/01/19 Yes [provider]  torsemide (DEMADEX) 20 MG tablet Take 1 tablet by mouth once daily 06/05/19  Yes [name], MD  trimethoprim-polymyxin b (POLYTRIM) ophthalmic solution Place 1 drop into the left eye See admin instructions. Use 3 to 4 times daily for 2 days following monthly eye injection 05/06/18  Yes [provider]  zinc sulfate 220 (50 Zn) MG capsule Take 1 capsule (220 mg total) by mouth daily. 08/01/18  Yes Barton Dubois, MD    Family History Family History  Problem Relation Age of Onset  . Lung cancer Mother   . Diabetes Father   . Diabetes Sister   . Hypertension Sister   . Hypothyroidism Sister   . Colon cancer Brother   . Diabetes Sister   . Hypothyroidism Brother   . Healthy Daughter   . Obesity Daughter   . Hypertension Daughter     Social History Social History   Tobacco Use  . Smoking status: Never Smoker  . Smokeless tobacco: Never Used  Substance Use Topics  . Alcohol use: No    Alcohol/week: 0.0 standard drinks  . Drug use: No     Allergies   Ferumoxytol and Tramadol hcl   Review of Systems Review of Systems  Unable to perform ROS: Mental status change     Physical Exam Updated Vital Signs BP (!) 133/36   Pulse 90   Temp  98.2 F (36.8 C) (Oral)   Resp 11   Ht 5' 4"  (1.626 m)   Wt [place].8 kg   SpO2 95%   BMI 28.32 kg/m   Physical Exam Vitals signs and nursing note reviewed.  HENT:     Head: Normocephalic and atraumatic.  Eyes:     Extraocular Movements: Extraocular movements intact.     Conjunctiva/sclera: Conjunctivae normal.     Pupils: Pupils are equal, round, and reactive to light.  Neck:  Musculoskeletal: Neck supple.  Cardiovascular:     Rate and Rhythm: Normal rate and regular rhythm.     Heart sounds: Normal heart sounds. No murmur.  Pulmonary:     Effort: Pulmonary effort is normal. No respiratory distress.     Breath sounds: Normal breath sounds. No wheezing or rales.  Abdominal:     General: Bowel sounds are normal. There is no distension.     Palpations: Abdomen is soft.     Tenderness: There is no abdominal tenderness.  Musculoskeletal: Normal range of motion.        General: No tenderness or deformity.  Skin:    General: Skin is warm and dry.     Coloration: Skin is jaundiced.     Findings: No erythema or rash.  Neurological:     Mental Status: She is alert. She is disoriented and confused.     Comments: Unable to do a full neuro exam due to patient's confusion.  However she does follow some commands and opens her eyes.  She has equal grip strength in bilateral upper extremities.  Psychiatric:        Behavior: Behavior normal.      ED Treatments / Results  Labs (all labs ordered are listed, but only abnormal results are displayed) Labs Reviewed  CBC WITH DIFFERENTIAL/PLATELET - Abnormal; Notable for the following components:      Result Value   RBC 3.26 (*)    Hemoglobin 7.4 (*)    HCT 27.4 (*)    MCH 22.7 (*)    MCHC 27.0 (*)    RDW 18.6 (*)    Platelets 50 (*)    nRBC 0.4 (*)    All other components within normal limits  COMPREHENSIVE METABOLIC PANEL - Abnormal; Notable for the following components:   CO2 15 (*)    Glucose, Bld 114 (*)    BUN 38 (*)     Creatinine, Ser 1.85 (*)    Total Protein 5.4 (*)    Albumin 2.7 (*)    Total Bilirubin 2.6 (*)    GFR calc non Af Amer 28 (*)    GFR calc Af Amer 33 (*)    All other components within normal limits  AMMONIA - Abnormal; Notable for the following components:   Ammonia 87 (*)    All other components within normal limits  SARS CORONAVIRUS 2 (TAT 6-24 HRS)  URINALYSIS, ROUTINE W REFLEX MICROSCOPIC  POC OCCULT BLOOD, ED  TYPE AND SCREEN    EKG None  Radiology Ct Head Wo Contrast  Result Date: 08/27/2019 CLINICAL DATA:  Status post fall out of bed this morning. Confusion. Initial encounter. EXAM: CT HEAD WITHOUT CONTRAST TECHNIQUE: Contiguous axial images were obtained from the base of the skull through the vertex without intravenous contrast. COMPARISON:  Head CT scan 02/26/2019.  Brain MRI 07/19/2019. FINDINGS: Brain: No evidence of acute infarction, hemorrhage, hydrocephalus, extra-axial collection or mass lesion/mass effect. Vascular: Atherosclerosis. Skull: Intact.  No focal lesion. Sinuses/Orbits: The left maxillary sinus is completely opacified, unchanged. Other: None. IMPRESSION: No acute abnormality. Atherosclerosis. Completely opacified left maxillary sinus, unchanged. Electronically Signed   By: Inge Rise M.D.   On: 08/27/2019 14:06    Procedures Procedures (including critical care time)  Medications Ordered in ED Medications  0.9 %  sodium chloride infusion ( Intravenous New Bag/Given 08/27/19 1425)     Initial Impression / Assessment and Plan / ED Course  I have reviewed the triage vital signs and the nursing  notes.  Pertinent labs & imaging results that were available during my care of the patient were reviewed by me and considered in my medical decision making (see chart for details).        Patient presents with altered mental status.  She is a long history of liver cirrhosis.  Patient unable to participate in HPI.  She is able to follow some commands.  She  has equal strength in bilateral upper extremities.  Lungs clear to auscultation throughout.  Abdomen soft and nontender palpation.  Vital signs stable.  Patient afebrile.  Her hemoglobin is noted to be low at 7.4, she is Hemoccult negative.  Her ammonia level is 87, up from 42 days ago.  Per husband she has been taking her lactulose.  He thinks that she has had bowel movements but he is only witnessed 1 bowel movement.  He notes increased confusion over the last 3 days.  Dr. Shonna Chock with GI called me and states that we will follow her inpatient.  I have discussed the case with hospitalist, Dr. Billie Ruddy who accepts the patient.  Final Clinical Impressions(s) / ED Diagnoses   Final diagnoses:  Hepatic encephalopathy ([place])  Anemia, unspecified type    ED Discharge Orders    None       Rachel Moulds 08/27/19 1628    Elnora Morrison, MD 08/31/19 [phone]

## 2019-08-27 NOTE — H&P (Addendum)
History and Physical    Maria [name] JQB:[id number] DOB: 25-Jun-1956 DOA: 08/27/2019  PCP: Glenda Chroman, MD  Patient coming from: home  I have personally briefly reviewed patient's old medical records in [place]  Chief Complaint: AMS  HPI: Maria [name] is a 63 y.o. female with medical history significant of Hypothyroidism, Depression, Fibromyalgia, Hypertension, Dm2, NASH cirrhosis w esophageal varices, GAVE, s/p recent EGD 05/23/2019 at [facility] by [facility] tx with APC, chronic anemia requiring frequent transfusion who presented with AMS.  Per husband, pt was more confused for the past several days, and contacted her GI doctor who advised coming to the ED.  Per husband, pt didn't miss her lactulose.  By the time of my interview, pt had become more lucid.  Pt admitted to being more confused.  Denied having fallen and injured herself.  Complained of some pain on her side (right).  Reported being compliant with her lactulose and multiple BM's per day.  No fever, dyspnea, chest pain, N/V, increased swelling.  Pt said she never had ascites.     ED Course: Initial vitals, Afebrile, HR 92, BP 129/50, sating 99% on room air.  Labs notable for normal WBC, Hgb 7.4 (baseline 6-7), plt 50, Cr 1.85 (baseline 0.9), albumin 2.7, ammonia 87, CXR wnl, CT head no acute finding.  Assessment/Plan Active Problems:   Hepatic encephalopathy ([place])  # Acute hepatic encephalopathy --Presented with AMS, and ammonia 87.  Husband and pt reported compliance with lactulose and having bowel movement, however, current dose of 20g QID may not be enough. PLAN:  --Increase lactulose to 30g QID, and ensure at least 3 BM's per day.  # AKI --Cr 1.85 on presentation (baseline 0.9), likely 2/2 volume depletion from diuretic and/or poor PO intake. --Hold home torsemide and aldactone for now. --Encourage hydration via PO, avoid IVF for now  # NASH-related liver cirrhosis --continue lactulose at increased  dose and home rifaximin --continue home atenolol --continue home torsemide and aldactone as daily --Follow-up with Dr. Laural Golden as outpatient  # Chronic Anemia --Hgb 7.4 on presentation, baseline 6-7.  Due to ongoing chronic GI blood loss, patient with longstanding history of GI bleed secondary to GAVE/esophageal varices in the setting of [place] related liver cirrhosis --Recently admitted 06/01/2019 with hemoglobin of 5.9.  Requires frequent transfusions.  --Monitor CBC and transfuse to keep Hgb >=7.  # Difficult IV access s/p Port-A-Cath placement on 06/02/2019  # DM2 --no recent A1c available --SSI TID with meals  # Hypothyroidism --continue levothyroxine 25 mcg daily  # Depression and Anxiety --Hold home Zoloft 50 mg daily and buspirone for now until mental status back to baseline  # Chronic hypoxic respiratory failure on 2 L nightly, stable --currently sating well on room air  # Hx of persistent hypokalemia and hypomagnesemia --due to GI losses in the patient with liver cirrhosis on lactulose as well as renal losses due to need for diuretic --Replete PRN   DVT prophylaxis: SCD/Compression stockings Code Status: Full code  Family Communication: none today  Disposition Plan: Home in 2-3 days  Consults called: none Admission status: Inpatient   Review of Systems: As per HPI otherwise rest of review of systems negative.   Past Medical History:  Diagnosis Date  . Anemia   . CHF (congestive heart failure) ([place])   . Cirrhosis ([place])   . Depression   . Diabetes mellitus   . Esophageal bleed, non-variceal   . Eye hemorrhage    Behind left eye  .  Fibromyalgia   . Hypercholesteremia   . Hypertension   . Hypothyroidism   . NAFLD (nonalcoholic fatty liver disease)   . Osteoarthrosis   . Osteoporosis   . PONV (postoperative nausea and vomiting)   . UTI (urinary tract infection) 11/12 end of the month   Patient feels that she passed a kidney stone at that time     Past Surgical History:  Procedure Laterality Date  . APPENDECTOMY  1980  . BILATERAL SALPINGOOPHORECTOMY    . CHOLECYSTECTOMY    . COLONOSCOPY  03/15/2011  . COLONOSCOPY N/A 03/24/2016   Procedure: COLONOSCOPY;  Surgeon: [name], MD;  [facility];  Service: Endoscopy;  Laterality: N/A;  855  . ESOPHAGEAL BANDING  04/24/2012   Procedure: ESOPHAGEAL BANDING;  Surgeon: [name], MD;  [facility];  Service: Endoscopy;  Laterality: N/A;  . Esophageal BANDING  08/03/2012   [facility] in [place] , [place]  09/24/2012   Procedure: ESOPHAGEAL BANDING;  Surgeon: [name], MD;  Location: [facility];  Service: Endoscopy;  Laterality: N/A;  Banding x 3  . ESOPHAGEAL BANDING N/A 01/29/2013   Procedure: ESOPHAGEAL BANDING;  Surgeon: [name], MD;  [facility];  Service: Endoscopy;  Laterality: N/A;  . ESOPHAGEAL BANDING N/A 06/19/2014   Procedure: ESOPHAGEAL BANDING;  Surgeon: [name], MD;  [facility];  Service: Endoscopy;  Laterality: N/A;  . ESOPHAGEAL BANDING N/A 01/05/2016   Procedure: ESOPHAGEAL BANDING;  Surgeon: [name], MD;  [facility];  Service: Endoscopy;  Laterality: N/A;  . ESOPHAGEAL BANDING N/A 08/03/2016   Procedure: ESOPHAGEAL BANDING;  Surgeon: [name], MD;  [facility];  Service: Endoscopy;  Laterality: N/A;  . ESOPHAGEAL BANDING N/A 05/02/2017   Procedure: ESOPHAGEAL BANDING;  Surgeon: [name], MD;  [facility];  Service: Endoscopy;  Laterality: N/A;  . ESOPHAGOGASTRODUODENOSCOPY  04/24/2012   Procedure: ESOPHAGOGASTRODUODENOSCOPY (EGD);  Surgeon: [name], MD;  [facility];  Service: Endoscopy;  Laterality: N/A;  300  . ESOPHAGOGASTRODUODENOSCOPY N/A 01/29/2013   Procedure: ESOPHAGOGASTRODUODENOSCOPY (EGD);  Surgeon: [name], MD;  [facility];  Service: Endoscopy;  Laterality: N/A;   1200  . ESOPHAGOGASTRODUODENOSCOPY N/A 05/22/2013   Procedure: ESOPHAGOGASTRODUODENOSCOPY (EGD);  Surgeon: [name], MD;  [facility];  Service: Endoscopy;  Laterality: N/A;  1:55  . ESOPHAGOGASTRODUODENOSCOPY N/A 12/31/2013   Procedure: ESOPHAGOGASTRODUODENOSCOPY (EGD);  Surgeon: [name], MD;  [facility];  Service: Endoscopy;  Laterality: N/A;  1200  . ESOPHAGOGASTRODUODENOSCOPY N/A 06/19/2014   Procedure: ESOPHAGOGASTRODUODENOSCOPY (EGD);  Surgeon: [name], MD;  [facility];  Service: Endoscopy;  Laterality: N/A;  1055  . ESOPHAGOGASTRODUODENOSCOPY N/A 01/15/2015   Procedure: ESOPHAGOGASTRODUODENOSCOPY (EGD);  Surgeon: [name], MD;  [facility];  Service: Endoscopy;  Laterality: N/A;  730 - moved to 9:45 - moved to 1250-Ann notified pt  . ESOPHAGOGASTRODUODENOSCOPY N/A 06/30/2015   Procedure: ESOPHAGOGASTRODUODENOSCOPY (EGD);  Surgeon: [name], MD;  [facility];  Service: Endoscopy;  Laterality: N/A;  1200  . ESOPHAGOGASTRODUODENOSCOPY N/A 01/05/2016   Procedure: ESOPHAGOGASTRODUODENOSCOPY (EGD);  Surgeon: [name], MD;  [facility];  Service: Endoscopy;  Laterality: N/A;  830  . ESOPHAGOGASTRODUODENOSCOPY N/A 08/03/2016   Procedure: ESOPHAGOGASTRODUODENOSCOPY (EGD);  Surgeon: [name], MD;  [facility];  Service: Endoscopy;  Laterality: N/A;  930  . ESOPHAGOGASTRODUODENOSCOPY N/A 05/02/2017   Procedure: ESOPHAGOGASTRODUODENOSCOPY (EGD);  Surgeon: [name], MD;  [facility];  Service: Endoscopy;  Laterality: N/A;  12:00  . ESOPHAGOGASTRODUODENOSCOPY N/A 08/17/2017   Procedure: ESOPHAGOGASTRODUODENOSCOPY (EGD);  Surgeon: [name], MD;  [facility];  Service: Endoscopy;  Laterality: N/A;  8:15  . ESOPHAGOGASTRODUODENOSCOPY N/A 09/12/2017   Procedure: ESOPHAGOGASTRODUODENOSCOPY (EGD);  Surgeon: [name], MD;  [facility];  Service: Endoscopy;  Laterality: N/A;  1040  . ESOPHAGOGASTRODUODENOSCOPY N/A 10/10/2017   Procedure: ESOPHAGOGASTRODUODENOSCOPY (EGD);  Surgeon: [name], MD;  [facility];  Service: Endoscopy;  Laterality: N/A;  225  . ESOPHAGOGASTRODUODENOSCOPY N/A 11/09/2017   Procedure: ESOPHAGOGASTRODUODENOSCOPY (EGD);  Surgeon: [name], MD;  [facility];  Service: Endoscopy;  Laterality: N/A;  . ESOPHAGOGASTRODUODENOSCOPY (EGD) WITH PROPOFOL  09/24/2012   Procedure: ESOPHAGOGASTRODUODENOSCOPY (EGD) WITH PROPOFOL;  Surgeon: [name], MD;  [facility];  Service: Endoscopy;  Laterality: N/A;  GE junction at 36  . ESOPHAGOGASTRODUODENOSCOPY (EGD) WITH PROPOFOL N/A 07/06/2017   Procedure: ESOPHAGOGASTRODUODENOSCOPY (EGD) WITH PROPOFOL;  Surgeon: [name], MD;  [facility];  Service: Endoscopy;  Laterality: N/A;  . ESOPHAGOGASTRODUODENOSCOPY W/ BANDING  08/2010  . GIVENS CAPSULE STUDY N/A 07/05/2017   Procedure: GIVENS CAPSULE STUDY;  Surgeon: [name], MD;  [facility];  Service: Endoscopy;  Laterality: N/A;  . HOT HEMOSTASIS N/A 08/17/2017   Procedure: HOT HEMOSTASIS (ARGON PLASMA COAGULATION/BICAP);  Surgeon: [name], MD;  [facility];  Service: Endoscopy;  Laterality: N/A;  . HOT HEMOSTASIS  09/12/2017   Procedure: HOT HEMOSTASIS (ARGON PLASMA COAGULATION/BICAP);  Surgeon: [name], MD;  [facility];  Service: Endoscopy;;  gastric  . HOT HEMOSTASIS  10/10/2017   Procedure: HOT HEMOSTASIS (ARGON PLASMA COAGULATION/BICAP);  Surgeon: [name], MD;  [facility];  Service: Endoscopy;;  . POLYPECTOMY  03/24/2016   Procedure: POLYPECTOMY;  Surgeon: [name], MD;  [facility];  Service: Endoscopy;;  sigmoid polyp  . PORTACATH PLACEMENT Right 06/02/2019   Procedure: INSERTION PORT-A-CATH;  Surgeon: Aviva Signs, MD;  Location: [facility];   Service: General;  Laterality: Right;  . RIGHT HEART CATH N/A 04/16/2017   Procedure: Right Heart Cath;  Surgeon: [name], MD;  Location: [place] [facility];  Service: Cardiovascular;  Laterality: N/A;  . RIGHT HEART CATH N/A 07/12/2017   Procedure: RIGHT HEART CATH;  Surgeon: [name], MD;  Location: [place] [facility];  Service: Cardiovascular;  Laterality: N/A;  . TONSILLECTOMY    . UPPER GASTROINTESTINAL ENDOSCOPY  03/15/2011   EGD ED BANDING/TCS  . UPPER GASTROINTESTINAL ENDOSCOPY  09/05/2010  . UPPER GASTROINTESTINAL ENDOSCOPY  08/11/2010  . VAGINAL HYSTERECTOMY       reports that she has never smoked. She has never used smokeless tobacco. She reports that she does not drink alcohol or use drugs.  Allergies  Allergen Reactions  . Ferumoxytol Other (See Comments)    Patient has severe back and chest pain when receiving FERAHEME infusions.   . Tramadol Hcl Other (See Comments)    WEAKNESS     Family History  Problem Relation Age of Onset  . Lung cancer Mother   . Diabetes Father   . Diabetes Sister   . Hypertension Sister   . Hypothyroidism Sister   .  Colon cancer Brother   . Diabetes Sister   . Hypothyroidism Brother   . Healthy Daughter   . Obesity Daughter   . Hypertension Daughter     Prior to Admission medications   Medication Sig Start Date End Date Taking? Authorizing Provider  atenolol (TENORMIN) 25 MG tablet Take 0.5 tablets (12.5 mg total) by mouth daily. 06/02/19 06/01/20 Yes Emokpae, Courage, MD  bumetanide (BUMEX) 2 MG tablet Take 2 mg by mouth 2 (two) times daily.   Yes [provider]  busPIRone (BUSPAR) 10 MG tablet Take 10 mg by mouth 2 (two) times daily.   Yes [provider]  cholecalciferol (VITAMIN D) 1000 UNITS tablet Take 1,000 Units by mouth daily.    Yes [provider]  ergocalciferol (VITAMIN D2) 1.25 MG (50000 UT) capsule Take 1 tablet by mouth every other day.   Yes [provider]   HUMALOG 100 UNIT/ML injection Inject 15-35 Units into the skin 3 (three) times daily with meals. Sliding scale insulin 100-150= 15 units 150-200= 20 units 250-300= 30 units 300-350= 35 units 07/01/18  Yes [provider]  Insulin Glargine (LANTUS SOLOSTAR) 100 UNIT/ML Solostar Pen Inject 10-55 Units into the skin See admin instructions. Inject 55 units subcutaneously in the morning & 10 units subcutaneously at night. 03/06/19  Yes [provider]  Insulin Pen Needle 31G X 5 MM MISC Use with each insulin injection 07/22/17  Yes [name], [name], MD  lactulose (CHRONULAC) 10 GM/15ML solution TAKE 30 ML BY MOUTH  4 TIMES DAILY 08/04/19  Yes Rehman, Mechele Dawley, MD  levothyroxine (SYNTHROID, LEVOTHROID) 25 MCG tablet Take 25 mcg by mouth daily before breakfast.    Yes [provider]  lidocaine-prilocaine (EMLA) cream Apply 1 application topically daily as needed. 08/19/19  Yes [provider]  magnesium oxide (MAG-OX) 400 MG tablet Take 400 mg by mouth daily.    Yes [provider]  pantoprazole (PROTONIX) 40 MG tablet Take 1 tablet (40 mg total) by mouth 2 (two) times daily before a meal. 06/03/19  Yes Emokpae, Courage, MD  Pediatric Multivitamins-Iron (FLINTSTONES PLUS IRON PO) Take 2 tablets by mouth daily.   Yes [provider]  potassium chloride (K-DUR) 20 MEQ tablet Take 2 tablets (40 mEq total) by mouth daily. 06/03/19  Yes Emokpae, Courage, MD  rifaximin (XIFAXAN) 550 MG TABS tablet Take 1 tablet (550 mg total) by mouth 2 (two) times daily. 02/27/19  Yes Barton Dubois, MD  rosuvastatin (CRESTOR) 5 MG tablet Take 5 mg by mouth every other day.   Yes [provider]  sertraline (ZOLOFT) 50 MG tablet Take 50 mg by mouth daily.  03/10/19  Yes [provider]  spironolactone (ALDACTONE) 50 MG tablet Take 1 tablet by mouth twice daily Patient taking differently: 100 mg once. K+ sparing diuretic: Hyperkalemia may occur with  decreased renal function 06/05/19  Yes Rehman, Mechele Dawley, MD  sucralfate (CARAFATE) 1 GM/10ML suspension Take 1 g by mouth 4 (four) times daily.  04/04/19 10/01/19 Yes [provider]  torsemide (DEMADEX) 20 MG tablet Take 1 tablet by mouth once daily 06/05/19  Yes [name], MD  trimethoprim-polymyxin b (POLYTRIM) ophthalmic solution Place 1 drop into the left eye See admin instructions. Use 3 to 4 times daily for 2 days following monthly eye injection 05/06/18  Yes [provider]  zinc sulfate 220 (50 Zn) MG capsule Take 1 capsule (220 mg total) by mouth daily. 08/01/18  Yes Barton Dubois,  MD    Physical Exam: Vitals:   08/27/19 1117 08/27/19 1126 08/27/19 1300 08/27/19 1400  BP:  (!) 129/50 (!) 121/42 (!) 133/36  Pulse:  92 94 90  Resp:  15 12 11   Temp:  98.2 F (36.8 C)    TempSrc:  Oral    SpO2:  99% 93% 95%  Weight: 74.8 kg     Height: 5' 4"  (1.626 m)       Vitals:   08/27/19 1117 08/27/19 1126 08/27/19 1300 08/27/19 1400  BP:  (!) 129/50 (!) 121/42 (!) 133/36  Pulse:  92 94 90  Resp:  15 12 11   Temp:  98.2 F (36.8 C)    TempSrc:  Oral    SpO2:  99% 93% 95%  Weight: 74.8 kg     Height: 5' 4"  (1.626 m)      Constitutional: NAD, oriented, slower processing but coherent  HEENT: conjunctivae and lids normal, EOMI CV: RRR no M,R,G. Distal pulses +2.  No cyanosis.   RESP: CTA B/L, normal respiratory effort, on room air GI: +BS, NTND, soft Extremities: No effusions, edema, or tenderness in BLE SKIN: warm, dry and intact Neuro: II - XII grossly intact.  Sensation intact Psych: Depressed mood and affect.    Labs on Admission: I have personally reviewed following labs and imaging studies  CBC: Recent Labs  Lab 08/25/19 1230 08/27/19 1151  WBC 5.1 4.6  NEUTROABS 3.3 3.1  HGB 8.1* 7.4*  HCT 28.8* 27.4*  MCV 81.1 84.0  PLT 56* 50*   Basic Metabolic Panel: Recent Labs  Lab 08/25/19 1230 08/27/19 1151  NA 134* 136  K 4.0 3.7  CL 102 107   CO2 20* 15*  GLUCOSE 170* 114*  BUN 32* 38*  CREATININE 1.56* 1.85*  CALCIUM 9.8 9.9   GFR: Estimated Creatinine Clearance: 30.4 mL/min (A) (by C-G formula based on SCr of 1.85 mg/dL (H)). Liver Function Tests: Recent Labs  Lab 08/25/19 1230 08/27/19 1151  AST 29 34  ALT 28 28  ALKPHOS 106 90  BILITOT 2.9* 2.6*  PROT 6.1* 5.4*  ALBUMIN 2.9* 2.7*   No results for input(s): LIPASE, AMYLASE in the last 168 hours. Recent Labs  Lab 08/25/19 1230 08/27/19 1151  AMMONIA 40* 87*   Coagulation Profile: No results for input(s): INR, PROTIME in the last 168 hours. Cardiac Enzymes: No results for input(s): CKTOTAL, CKMB, CKMBINDEX, TROPONINI in the last 168 hours. BNP (last 3 results) No results for input(s): PROBNP in the last 8760 hours. HbA1C: No results for input(s): HGBA1C in the last 72 hours. CBG: No results for input(s): GLUCAP in the last 168 hours. Lipid Profile: No results for input(s): CHOL, HDL, LDLCALC, TRIG, CHOLHDL, LDLDIRECT in the last 72 hours. Thyroid Function Tests: No results for input(s): TSH, T4TOTAL, FREET4, T3FREE, THYROIDAB in the last 72 hours. Anemia Panel: No results for input(s): VITAMINB12, FOLATE, FERRITIN, TIBC, IRON, RETICCTPCT in the last 72 hours. Urine analysis:    Component Value Date/Time   COLORURINE YELLOW 08/25/2019 1313   APPEARANCEUR CLEAR 08/25/2019 1313   LABSPEC 1.008 08/25/2019 1313   PHURINE 5.0 08/25/2019 1313   GLUCOSEU NEGATIVE 08/25/2019 1313   HGBUR MODERATE (A) 08/25/2019 1313   BILIRUBINUR NEGATIVE 08/25/2019 1313   KETONESUR NEGATIVE 08/25/2019 1313   PROTEINUR NEGATIVE 08/25/2019 1313   NITRITE NEGATIVE 08/25/2019 1313   LEUKOCYTESUR NEGATIVE 08/25/2019 1313    Radiological Exams on Admission: Ct Head Wo Contrast  Result Date: 08/27/2019 CLINICAL DATA:  Status  post fall out of bed this morning. Confusion. Initial encounter. EXAM: CT HEAD WITHOUT CONTRAST TECHNIQUE: Contiguous axial images were obtained from  the base of the skull through the vertex without intravenous contrast. COMPARISON:  Head CT scan 02/26/2019.  Brain MRI 07/19/2019. FINDINGS: Brain: No evidence of acute infarction, hemorrhage, hydrocephalus, extra-axial collection or mass lesion/mass effect. Vascular: Atherosclerosis. Skull: Intact.  No focal lesion. Sinuses/Orbits: The left maxillary sinus is completely opacified, unchanged. Other: None. IMPRESSION: No acute abnormality. Atherosclerosis. Completely opacified left maxillary sinus, unchanged. Electronically Signed   By: Inge Rise M.D.   On: 08/27/2019 14:06     Enzo Bi MD [facility]  If 7PM-7AM, please contact night-coverage 08/27/2019, 2:57 PM

## 2019-08-27 NOTE — ED Notes (Signed)
Have given pt orange juice to drink

## 2019-08-27 NOTE — ED Notes (Signed)
Pt still has not had any urine output

## 2019-08-27 NOTE — ED Notes (Signed)
Pt is getting the rest of fluids provided by EMS

## 2019-08-27 NOTE — Unmapped (Signed)
Call received from [name] @ Dr. Patty Sermons office. Reviewed patients reports reaction to feraheme in the past. Requested she discuss possible local infusion with Dr. Karilyn Cota if he deems it appropriate.     Per [name] - patient was @ local hospital Monday s/p fall related to confusion. She was subsequently discharged but per call received from spouse this am has again fallen (or slid to the floor) and is confused.     Call placed and spoke with Hildred Priest. Patient fell / slid to the floor > one hour ago and he has been unable to get her up. He states his nephew is en route and should arrive any minute (of note - ou conversation lasted > 10 minutes and nephew had not arrived). Mr. Haff was emphatically advised to call 911 given extensive time patient had been on the floor. He was very hesitant and continued to ask questions re: best hospital to take patient to, how to best contact Dr. Karilyn Cota for guidance etc.. Advised against putting patient in car for 2 hours drive to [facility] given ongoing confusion / fall and unknown injury. Advised to bring to local hospital and have staff there contact [facility] for recommendations  / transfer. Spouse remained focused on importance of care @ [facility]. Stressed importance of first assisting patient from floor, if he and nephew unable then 911 call is  necessary. If nephew and spouse able to lift patient safely and he continues to wish to bring her to [facility], requested he contact me so that I could contact [facility] ED prior to arrival. He stated understanding.

## 2019-08-28 ENCOUNTER — Encounter (INDEPENDENT_AMBULATORY_CARE_PROVIDER_SITE_OTHER): Payer: Self-pay

## 2019-08-28 DIAGNOSIS — D649 Anemia, unspecified: Secondary | ICD-10-CM

## 2019-08-28 LAB — CBC
HCT: 22.1 % — ABNORMAL LOW (ref 36.0–46.0)
Hemoglobin: 6.1 g/dL — CL (ref 12.0–15.0)
MCH: 22.8 pg — ABNORMAL LOW (ref 26.0–34.0)
MCHC: 27.6 g/dL — ABNORMAL LOW (ref 30.0–36.0)
MCV: 82.5 fL (ref 80.0–100.0)
Platelets: 42 10*3/uL — ABNORMAL LOW (ref 150–400)
RBC: 2.68 MIL/uL — ABNORMAL LOW (ref 3.87–5.11)
RDW: 18.6 % — ABNORMAL HIGH (ref 11.5–15.5)
WBC: 2.7 10*3/uL — ABNORMAL LOW (ref 4.0–10.5)
nRBC: 0 % (ref 0.0–0.2)

## 2019-08-28 LAB — COMPREHENSIVE METABOLIC PANEL
ALT: 26 U/L (ref 0–44)
AST: 32 U/L (ref 15–41)
Albumin: 2.3 g/dL — ABNORMAL LOW (ref 3.5–5.0)
Alkaline Phosphatase: 77 U/L (ref 38–126)
Anion gap: 8 (ref 5–15)
BUN: 38 mg/dL — ABNORMAL HIGH (ref 8–23)
CO2: 20 mmol/L — ABNORMAL LOW (ref 22–32)
Calcium: 8.9 mg/dL (ref 8.9–10.3)
Chloride: 110 mmol/L (ref 98–111)
Creatinine, Ser: 1.61 mg/dL — ABNORMAL HIGH (ref 0.44–1.00)
GFR calc Af Amer: 39 mL/min — ABNORMAL LOW (ref >60)
GFR calc non Af Amer: 33 mL/min — ABNORMAL LOW (ref >60)
Glucose, Bld: 124 mg/dL — ABNORMAL HIGH (ref 70–99)
Potassium: 3.5 mmol/L (ref 3.5–5.1)
Sodium: 138 mmol/L (ref 135–145)
Total Bilirubin: 1.9 mg/dL — ABNORMAL HIGH (ref 0.3–1.2)
Total Protein: 4.8 g/dL — ABNORMAL LOW (ref 6.5–8.1)

## 2019-08-28 LAB — GLUCOSE, CAPILLARY
Glucose-Capillary: 108 mg/dL — ABNORMAL HIGH (ref 70–99)
Glucose-Capillary: 243 mg/dL — ABNORMAL HIGH (ref 70–99)
Glucose-Capillary: 157 mg/dL — ABNORMAL HIGH (ref 70–99)
Glucose-Capillary: 183 mg/dL — ABNORMAL HIGH (ref 70–99)

## 2019-08-28 LAB — PREPARE RBC (CROSSMATCH)

## 2019-08-28 LAB — SARS CORONAVIRUS 2 (TAT 6-24 HRS): SARS Coronavirus 2: POSITIVE — AB

## 2019-08-28 LAB — PROTIME-INR
INR: 1.8 — ABNORMAL HIGH (ref 0.8–1.2)
Prothrombin Time: 20.7 seconds — ABNORMAL HIGH (ref 11.4–15.2)

## 2019-08-28 MED ORDER — SPIRONOLACTONE 25 MG PO TABS: 50.0000 mg | ORAL_TABLET | Freq: Every day | ORAL | Status: DC

## 2019-08-28 MED ORDER — BUMETANIDE 1 MG PO TABS: 2.0000 mg | ORAL_TABLET | Freq: Two times a day (BID) | ORAL | Status: DC

## 2019-08-28 MED ORDER — FLEET ENEMA 7-19 GM/118ML RE ENEM
1.0000 | ENEMA | Freq: Once | RECTAL | Status: AC
  Administered 2019-08-29: 1 via RECTAL

## 2019-08-28 MED ORDER — SODIUM CHLORIDE 0.9% IV SOLUTION
Freq: Once | INTRAVENOUS | Status: AC
  Administered 2019-08-28: 15:00:00 via INTRAVENOUS

## 2019-08-28 MED ORDER — FLEET ENEMA 7-19 GM/118ML RE ENEM
1.0000 | ENEMA | Freq: Once | RECTAL | Status: AC
  Administered 2019-08-28: 1 via RECTAL

## 2019-08-28 MED ORDER — TORSEMIDE 20 MG PO TABS: 20.0000 mg | ORAL_TABLET | Freq: Every day | ORAL | Status: DC

## 2019-08-28 MED ORDER — CHLORHEXIDINE GLUCONATE CLOTH 2 % EX PADS
6.0000 | MEDICATED_PAD | Freq: Every day | CUTANEOUS | Status: DC
  Administered 2019-08-28 – 2019-09-01 (×5): 6 via TOPICAL

## 2019-08-28 NOTE — Consult Note (Signed)
Spoke with Dr. Billie Ruddy. No formal GI consult needed at this time.   Laureen Ochs. Bernarda Caffey [facility] [phone] 12/10/202011:15 AM

## 2019-08-28 NOTE — Progress Notes (Signed)
CRITICAL VALUE ALERT  Critical Value:  Hemoglobin 6.1  Date & Time Notied:  08/28/19, 0700  Provider Notified: Billie Ruddy  Orders Received/Actions taken:

## 2019-08-28 NOTE — Progress Notes (Addendum)
PROGRESS NOTE    Maria [name]  IFO:[id number] DOB: [birth year] DOA: 08/27/2019 PCP: Glenda Chroman, MD    Assessment & Plan:   Active Problems:   Hepatic encephalopathy (HCC)   Maria [name] is a 63 y.o. female with medical history significant of Hypothyroidism, Depression, Fibromyalgia, Hypertension, Dm2, NASH cirrhosis w esophageal varices, GAVE, s/p recent EGD 05/23/2019 at [facility] by [facility] tx with APC, chronic anemia requiring frequent transfusion who presented with AMS.  # Asymptomatic COVID infection --Monitor and put in isolation.  No tx necessary.  # Acute hepatic encephalopathy --Presented with AMS, and ammonia 87.  Husband and pt reported compliance with lactulose and having bowel movement, however, current dose of 20g QID may not be enough. PLAN:  --continue lactulose to 30g QID, and ensure at least 3 BM's per day.  # AKI --Cr 1.85 on presentation (baseline 0.9), likely 2/2 volume depletion from diuretic and/or poor PO intake. --Hold home torsemide and aldactone for now. --Encourage hydration via PO, avoid IVF for now  # NASH-related liver cirrhosis --continue lactulose at increased dose and home rifaximin --continue home atenolol --Hold home torsemide and aldactone for now 2/2 AKI --Follow-up with Dr. Laural Golden as outpatient  #ChronicAnemia --Hgb 7.4 on presentation, baseline 6-7.  Due toongoingchronic GI blood loss, patient with longstanding history of GI bleed secondary to GAVE/esophageal varices in the setting of [place] related liver cirrhosis --Recently admitted 06/01/2019 with hemoglobin of 5.9.  Requires frequent transfusions.  --Monitor CBC and transfuse to keep Hgb >=7.  # Difficult IV access s/p Port-A-Cath placement on 06/02/2019  # DM2 --no recent A1c available --SSI TID with meals  # Hypothyroidism --continue levothyroxine 25 mcg daily  # Depression andAnxiety --Hold home Zoloft 50 mg daily and buspirone for now until mental  status back to baseline  # Chronic hypoxic respiratory failure on 2 L nightly, stable --currently sating well on room air  # Hx of persistent hypokalemia and hypomagnesemia --due to GI losses in the patient with liver cirrhosis on lactulose as well as renal losses due to need for diuretic --Replete PRN   DVT prophylaxis: SCD/Compression stockings Code Status: Full code  Family Communication: none today  Disposition Plan: Home in 2-3 days  Consults called: none Admission status: Inpatient   Subjective: Pt reported feeling a little better.  Reported weakness which is chronic.  Reported having BM's but can't tell how many episodes.  No fever, dyspnea, cough, chest pain, N/V, dysuria, increased swelling.  COVID screening came back positive.   Objective: Vitals:   08/28/19 0831 08/28/19 1145 08/28/19 1229 08/28/19 1358  BP: (!) 112/35 (!) 112/45 (!) 104/43 (!) 113/45  Pulse: 78 70 69 74  Resp:  20 20 20   Temp:  98.9 F (37.2 C) 98.7 F (37.1 C) 98.7 F (37.1 C)  TempSrc:  Oral  Oral  SpO2: 100% 100%  100%  Weight:      Height:        Intake/Output Summary (Last 24 hours) at 08/28/2019 2059 Last data filed at 08/28/2019 1800 Gross per 24 hour  Intake 590.17 ml  Output 1200 ml  Net -609.83 ml   Filed Weights   08/27/19 1117 08/27/19 1621  Weight: 74.8 kg 75.1 kg    Examination:  Constitutional: NAD, oriented, slow to respond, but appeared coherent HEENT: conjunctivae and lids normal, EOMI CV: RRR no M,R,G. Distal pulses +2.  No cyanosis.   RESP: CTA B/L, normal respiratory effort  GI: active +BS, NTND Extremities: No  effusions, edema, or tenderness in BLE SKIN: warm, dry and intact Neuro: II - XII grossly intact.  Sensation intact Psych: Flat mood and affect.     Data Reviewed: I have personally reviewed following labs and imaging studies  CBC: Recent Labs  Lab 08/25/19 1230 08/27/19 1151 08/28/19 0533  WBC 5.1 4.6 2.7*  NEUTROABS 3.3 3.1  --     HGB 8.1* 7.4* 6.1*  HCT 28.8* 27.4* 22.1*  MCV 81.1 84.0 82.5  PLT 56* 50* 42*   Basic Metabolic Panel: Recent Labs  Lab 08/25/19 1230 08/27/19 1151 08/28/19 0533  NA 134* 136 138  K 4.0 3.7 3.5  CL 102 107 110  CO2 20* 15* 20*  GLUCOSE 170* 114* 124*  BUN 32* 38* 38*  CREATININE 1.56* 1.85* 1.61*  CALCIUM 9.8 9.9 8.9   GFR: Estimated Creatinine Clearance: 35.1 mL/min (A) (by C-G formula based on SCr of 1.61 mg/dL (H)). Liver Function Tests: Recent Labs  Lab 08/25/19 1230 08/27/19 1151 08/28/19 0533  AST 29 34 32  ALT 28 28 26   ALKPHOS 106 90 77  BILITOT 2.9* 2.6* 1.9*  PROT 6.1* 5.4* 4.8*  ALBUMIN 2.9* 2.7* 2.3*   No results for input(s): LIPASE, AMYLASE in the last 168 hours. Recent Labs  Lab 08/25/19 1230 08/27/19 1151  AMMONIA 40* 87*   Coagulation Profile: Recent Labs  Lab 08/28/19 0533  INR 1.8*   Cardiac Enzymes: No results for input(s): CKTOTAL, CKMB, CKMBINDEX, TROPONINI in the last 168 hours. BNP (last 3 results) No results for input(s): PROBNP in the last 8760 hours. HbA1C: No results for input(s): HGBA1C in the last 72 hours. CBG: Recent Labs  Lab 08/27/19 1541 08/27/19 2257 08/28/19 0752 08/28/19 1153 08/28/19 1712  GLUCAP 106* 175* 108* 243* 157*   Lipid Profile: No results for input(s): CHOL, HDL, LDLCALC, TRIG, CHOLHDL, LDLDIRECT in the last 72 hours. Thyroid Function Tests: No results for input(s): TSH, T4TOTAL, FREET4, T3FREE, THYROIDAB in the last 72 hours. Anemia Panel: No results for input(s): VITAMINB12, FOLATE, FERRITIN, TIBC, IRON, RETICCTPCT in the last 72 hours. Sepsis Labs: No results for input(s): PROCALCITON, LATICACIDVEN in the last 168 hours.  Recent Results (from the past 240 hour(s))  SARS CORONAVIRUS 2 (TAT 6-24 HRS) Nasopharyngeal Nasopharyngeal Swab     Status: None   Collection Time: 08/25/19  1:14 PM   Specimen: Nasopharyngeal Swab  Result Value Ref Range Status   SARS Coronavirus 2 NEGATIVE NEGATIVE  Final    Comment: (NOTE) SARS-CoV-2 target nucleic acids are NOT DETECTED. The SARS-CoV-2 RNA is generally detectable in upper and lower respiratory specimens during the acute phase of infection. Negative results do not preclude SARS-CoV-2 infection, do not rule out co-infections with other pathogens, and should not be used as the sole basis for treatment or other patient management decisions. Negative results must be combined with clinical observations, patient history, and epidemiological information. The expected result is Negative. Fact Sheet for Patients: SugarRoll.be Fact Sheet for Healthcare Providers: https://www.woods-mathews.com/ This test is not yet approved or cleared by the [place] FDA and  has been authorized for detection and/or diagnosis of SARS-CoV-2 by FDA under an Emergency Use Authorization (EUA). This EUA will remain  in effect (meaning this test can be used) for the duration of the COVID-19 declaration under Section 56 4(b)(1) of the Act, 21 U.S.C. section 360bbb-3(b)(1), unless the authorization is terminated or revoked sooner. Performed at [facility], [address] [place], [place] [address]   SARS  CORONAVIRUS 2 (TAT 6-24 HRS) Nasopharyngeal Nasopharyngeal Swab     Status: Abnormal   Collection Time: 08/27/19  2:19 PM   Specimen: Nasopharyngeal Swab  Result Value Ref Range Status   SARS Coronavirus 2 POSITIVE (A) NEGATIVE Final    Comment: RESULT CALLED TO, READ BACK BY AND VERIFIED WITH: Junious Silk RN 9:30 08/28/19 ([email or web address]) (NOTE) SARS-CoV-2 target nucleic acids are DETECTED. The SARS-CoV-2 RNA is generally detectable in upper and lower respiratory specimens during the acute phase of infection. Positive results are indicative of the presence of SARS-CoV-2 RNA. Clinical correlation with patient history and other diagnostic information is  necessary to determine patient infection status. Positive  results do not rule out bacterial infection or co-infection with other viruses.  The expected result is Negative. Fact Sheet for Patients: SugarRoll.be Fact Sheet for Healthcare Providers: https://www.woods-mathews.com/ This test is not yet approved or cleared by the [place] FDA and  has been authorized for detection and/or diagnosis of SARS-CoV-2 by FDA under an Emergency Use Authorization (EUA). This EUA will remain  in effect (meaning this test can be used) for th e duration of the COVID-19 declaration under Section 564(b)(1) of the Act, 21 U.S.C. section 360bbb-3(b)(1), unless the authorization is terminated or revoked sooner. Performed at [facility], [place] [address] [place], [place] [address]          Radiology Studies: CT Head Wo Contrast  Result Date: 08/27/2019 CLINICAL DATA:  Status post fall out of bed this morning. Confusion. Initial encounter. EXAM: CT HEAD WITHOUT CONTRAST TECHNIQUE: Contiguous axial images were obtained from the base of the skull through the vertex without intravenous contrast. COMPARISON:  Head CT scan 02/26/2019.  Brain MRI 07/19/2019. FINDINGS: Brain: No evidence of acute infarction, hemorrhage, hydrocephalus, extra-axial collection or mass lesion/mass effect. Vascular: Atherosclerosis. Skull: Intact.  No focal lesion. Sinuses/Orbits: The left maxillary sinus is completely opacified, unchanged. Other: None. IMPRESSION: No acute abnormality. Atherosclerosis. Completely opacified left maxillary sinus, unchanged. Electronically Signed   By: Inge Rise M.D.   On: 08/27/2019 14:06        Scheduled Meds: . atenolol  12.5 mg Oral Daily  . Chlorhexidine Gluconate Cloth  6 each Topical Daily  . insulin aspart  0-9 Units Subcutaneous TID WC  . lactulose  30 g Oral QID  . levothyroxine  25 mcg Oral Q0600  . pantoprazole  40 mg Oral BID AC  . rifaximin  550 mg Oral BID  . sodium phosphate  1  enema Rectal Once  . sodium phosphate  1 enema Rectal Once   Continuous Infusions:   LOS: 1 day    Enzo Bi, MD [facility] If 7PM-7AM, please contact night-coverage 08/28/2019, 8:59 PM

## 2019-08-29 ENCOUNTER — Other Ambulatory Visit (HOSPITAL_COMMUNITY): Payer: Self-pay | Admitting: *Deleted

## 2019-08-29 LAB — COMPREHENSIVE METABOLIC PANEL
ALT: 32 U/L (ref 0–44)
AST: 41 U/L (ref 15–41)
Albumin: 2.4 g/dL — ABNORMAL LOW (ref 3.5–5.0)
Alkaline Phosphatase: 81 U/L (ref 38–126)
Anion gap: 6 (ref 5–15)
BUN: 29 mg/dL — ABNORMAL HIGH (ref 8–23)
CO2: 20 mmol/L — ABNORMAL LOW (ref 22–32)
Calcium: 8.5 mg/dL — ABNORMAL LOW (ref 8.9–10.3)
Chloride: 111 mmol/L (ref 98–111)
Creatinine, Ser: 1.22 mg/dL — ABNORMAL HIGH (ref 0.44–1.00)
GFR calc Af Amer: 54 mL/min — ABNORMAL LOW (ref >60)
GFR calc non Af Amer: 47 mL/min — ABNORMAL LOW (ref >60)
Glucose, Bld: 168 mg/dL — ABNORMAL HIGH (ref 70–99)
Potassium: 3.3 mmol/L — ABNORMAL LOW (ref 3.5–5.1)
Sodium: 137 mmol/L (ref 135–145)
Total Bilirubin: 2.1 mg/dL — ABNORMAL HIGH (ref 0.3–1.2)
Total Protein: 5 g/dL — ABNORMAL LOW (ref 6.5–8.1)

## 2019-08-29 LAB — TYPE AND SCREEN
ABO/RH(D): O NEG
Antibody Screen: NEGATIVE
Unit division: 0

## 2019-08-29 LAB — GLUCOSE, CAPILLARY
Glucose-Capillary: 167 mg/dL — ABNORMAL HIGH (ref 70–99)
Glucose-Capillary: 162 mg/dL — ABNORMAL HIGH (ref 70–99)
Glucose-Capillary: 155 mg/dL — ABNORMAL HIGH (ref 70–99)
Glucose-Capillary: 149 mg/dL — ABNORMAL HIGH (ref 70–99)

## 2019-08-29 LAB — PROTIME-INR
INR: 1.8 — ABNORMAL HIGH (ref 0.8–1.2)
Prothrombin Time: 20.6 seconds — ABNORMAL HIGH (ref 11.4–15.2)

## 2019-08-29 LAB — CBC
HCT: 27 % — ABNORMAL LOW (ref 36.0–46.0)
Hemoglobin: 7.6 g/dL — ABNORMAL LOW (ref 12.0–15.0)
MCH: 23.3 pg — ABNORMAL LOW (ref 26.0–34.0)
MCHC: 28.1 g/dL — ABNORMAL LOW (ref 30.0–36.0)
MCV: 82.8 fL (ref 80.0–100.0)
Platelets: 41 10*3/uL — ABNORMAL LOW (ref 150–400)
RBC: 3.26 MIL/uL — ABNORMAL LOW (ref 3.87–5.11)
RDW: 18.6 % — ABNORMAL HIGH (ref 11.5–15.5)
WBC: 3.4 10*3/uL — ABNORMAL LOW (ref 4.0–10.5)
nRBC: 0 % (ref 0.0–0.2)

## 2019-08-29 LAB — AMMONIA: Ammonia: 116 umol/L — ABNORMAL HIGH (ref 9–35)

## 2019-08-29 LAB — BPAM RBC
Blood Product Expiration Date: 202012282359
ISSUE DATE / TIME: 202012101202
Unit Type and Rh: 9500

## 2019-08-29 MED ORDER — LACTULOSE 10 GM/15ML PO SOLN
30.0000 g | Freq: Every day | ORAL | Status: DC
  Administered 2019-08-29 (×3): 30 g via ORAL
  Filled 2019-08-29 (×3): qty 60

## 2019-08-29 NOTE — TOC Initial Note (Signed)
Transition of Care [facility]) - Initial/Assessment Note    Patient Details  Name: Maria [name] MRN: [id number] Date of Birth: 1956/03/27  Transition [facility] CM/SW Contact:    Boneta Lucks, RN Phone Number: 08/29/2019, 2:41 PM  Clinical Narrative:  Patient admitted for hepatic encephalopathy, also has a high readmission score. Patient lives at home with her husband.  Patient is confused and COVID positive. Patient is active with [facility], wished to continue therapy at discharge. TOC to follow.                   Expected Discharge Plan: [place] Barriers to Discharge: Continued Medical Work up   Patient Goals and CMS Choice Patient states their goals for this hospitalization and ongoing recovery are:: to go home with [facility].    Expected Discharge Plan and Services Expected Discharge Plan: [place] arrangements for the past 2 months: Single Family Home                   Prior Living Arrangements/Services Living arrangements for the past 2 months: Single Family Home Lives with:: Spouse            Activities of Daily Living Home Assistive Devices/Equipment: Wheelchair, CBG Meter, Eyeglasses, Blood pressure cuff ADL Screening (condition at time of admission) Patient's cognitive ability adequate to safely complete daily activities?: No Is the patient deaf or have difficulty hearing?: No Does the patient have difficulty seeing, even when wearing glasses/contacts?: No Does the patient have difficulty concentrating, remembering, or making decisions?: Yes Patient able to express need for assistance with ADLs?: No Does the patient have difficulty dressing or bathing?: Yes Independently performs ADLs?: No Communication: Independent Dressing (OT): Needs assistance Is this a change from baseline?: Pre-admission baseline Grooming: Needs assistance Is this a change from baseline?: Pre-admission baseline Feeding:  Independent Bathing: Needs assistance Is this a change from baseline?: Pre-admission baseline Toileting: Needs assistance Is this a change from baseline?: Pre-admission baseline In/Out Bed: Needs assistance Is this a change from baseline?: Pre-admission baseline Walks in Home: Needs assistance Is this a change from baseline?: Pre-admission baseline Does the patient have difficulty walking or climbing stairs?: Yes Weakness of Legs: Both Weakness of Arms/Hands: Both  Permission Sought/Granted      Alcohol / Substance Use: Other (comment) Psych Involvement: No (comment)  Admission diagnosis:  Hepatic encephalopathy ([place]) [K72.90] Anemia, unspecified type [D64.9] Patient Active Problem List   Diagnosis Date Noted  . Hepatic encephalopathy ([place]) 08/27/2019  . Altered mental status 02/27/2019  . Bacteriuria 02/27/2019  . Acute on chronic anemia 02/24/2019  . Acute hepatic encephalopathy 07/26/2018  . Obesity, Class III, BMI 40-49.9 (morbid obesity) ([place])   . Melena   . Anemia 07/02/2018  . UGI bleed 11/09/2017  . Insulin dependent diabetes mellitus   . Absolute anemia 09/13/2017  . Idiopathic esophageal varices with bleeding ([place]) 09/13/2017  . Other cirrhosis of liver ([place]) 08/22/2017  . NAFLD (nonalcoholic fatty liver disease) 08/14/2017  . Encephalopathy, hepatic ([place]) 08/14/2017  . Metabolic encephalopathy 23/53/6144  . Hyperammonemia ([place]) 07/18/2017  . Hypothyroidism 07/18/2017  . Acute on chronic right-sided congestive heart failure ([place]) 07/06/2017  . Pulmonary hypertension ([place]) 07/06/2017  . GAVE (gastric antral vascular ectasia) 07/06/2017  . GI bleeding 07/06/2017  . Symptomatic anemia 07/04/2017  . Hypokalemia 07/04/2017  . Chronic diastolic heart failure ([place]) 06/11/2017  . CAD (coronary artery disease) 06/11/2017  . Nocturnal  hypoxemia 02/22/2017  . Idiopathic esophageal varices without bleeding ([place]) 01/31/2017  . Dyspnea and respiratory abnormalities  01/25/2017  . Esophageal varices in cirrhosis ([place]) 05/19/2016  . Hepatic cirrhosis ([place]) 05/19/2016  . Esophageal varices ([place]) 12/09/2012  . Dysphagia, unspecified(787.20) 12/09/2012  . Anxiety 08/26/2012  . Cirrhosis, nonalcoholic ([place]) 02/54/2706  . Diabetes mellitus ([place]) 10/09/2011  . History of esophageal varices 10/09/2011  . Iron deficiency anemia 10/09/2011  . GERD (gastroesophageal reflux disease) 10/09/2011  . Thrombocytopenia ([place]) 10/09/2011   PCP:  Glenda Chroman, MD Pharmacy:   [facility] [address], [place] - [place] [place] [address] Phone: [phone] Fax: [phone]  [facility] - [place], [place] [address] Phone: [phone] Fax: [phone]   Readmission Risk Interventions Readmission Risk Prevention Plan 08/29/2019  Transportation Screening Complete  Medication Review ([place]) Complete  PCP or Specialist appointment within 3-5 days of discharge Not Complete  HRI or Home Care Consult Complete  SW Recovery Care/Counseling Consult Complete  Palliative Care Screening Not Complete  Skilled [place] Not Complete  Some recent data might be hidden

## 2019-08-29 NOTE — Care Management Important Message (Signed)
Important Message  Patient Details  Name: Maria [name] MRN: [id number] Date of Birth: 03-07-1956   Medicare Important Message Given:  Yes(Nava, RN will deliver to patient due to contact precautions)     Tommy Medal 08/29/2019, 3:56 PM

## 2019-08-29 NOTE — Care Management Important Message (Deleted)
Important Message  Patient Details  Name: Maria [name] MRN: [id number] Date of Birth: 04/02/1956   Medicare Important Message Given:  Yes     Tommy Medal 08/29/2019, 2:24 PM

## 2019-08-29 NOTE — Progress Notes (Signed)
PROGRESS NOTE    Maria [name]  VZC:[id number] DOB: Dec 20, 1955 DOA: 08/27/2019 PCP: Glenda Chroman, MD    Assessment & Plan:   Active Problems:   Hepatic encephalopathy (HCC)   Maria [name] is a 63 y.o. female with medical history significant of Hypothyroidism, Depression, Fibromyalgia, Hypertension, Dm2, NASH cirrhosis w esophageal varices, GAVE, s/p recent EGD 05/23/2019 at [facility] by Leafy Half tx with APC, chronic anemia requiring frequent transfusion who presented with AMS.  # Asymptomatic COVID infection --Monitor and put in isolation.  No tx necessary.  # Acute hepatic encephalopathy --Presented with AMS, and ammonia 87.  Husband and pt reported compliance with lactulose and having bowel movement, however, prior home dose of 20g QID may not be enough.  Increased dose of 30g QID since presentation was not enough either.  Bowel achieved with enema. PLAN:  --Increase lactulose to 30g 6 times per day, and ensure at least 3 BM's per day.  # AKI, improved --Cr 1.85 on presentation (baseline 0.9), likely 2/2 volume depletion from diuretic and/or poor PO intake. --Hold home torsemide and aldactone for now. --Encourage hydration via PO, avoid IVF for now  # NASH-related liver cirrhosis --continue lactulose at increased dose and home rifaximin --continue home atenolol --Hold home torsemide and aldactone for now 2/2 AKI --Follow-up with Dr. Laural Golden as outpatient  #ChronicAnemia --Hgb 7.4 on presentation, baseline 6-7.  Due toongoingchronic GI blood loss, patient with longstanding history of GI bleed secondary to GAVE/esophageal varices in the setting of Nash related liver cirrhosis --Recently admitted 06/01/2019 with hemoglobin of 5.9.  Requires frequent transfusions.  --Received 1u pRBC on 12/10 for Hgb 6.1 --Monitor CBC and transfuse to keep Hgb >=7.  # Difficult IV access s/p Port-A-Cath placement on 06/02/2019  # DM2 --no recent A1c available --SSI TID with  meals  # Hypothyroidism --continue levothyroxine 25 mcg daily  # Depression andAnxiety --Hold home Zoloft 50 mg daily and buspirone for now until mental status back to baseline  # Chronic hypoxic respiratory failure on 2 L nightly, stable --currently sating well on room air  # Hx of persistent hypokalemia and hypomagnesemia --due to GI losses in the patient with liver cirrhosis on lactulose as well as renal losses due to need for diuretic --Replete PRN   DVT prophylaxis: SCD/Compression stockings Code Status: Full code  Family Communication: updated husband on the phone today Disposition Plan: Home in 1-2 days  Consults called: none Admission status: Inpatient   Subjective: Overnight, pt received enema with subsequent multiple BM's.  Apparently pt was still constipated even though compliant with lactulose and received increased dose since presentation.    Today, pt reported feeling better.  No fever, dyspnea, cough, chest pain, abdominal pain, N/V, dysuria, increased swelling.   Objective: Vitals:   08/28/19 1358 08/28/19 2347 08/29/19 0550 08/29/19 1600  BP: (!) 113/45 (!) 120/47 (!) 118/43 (!) 112/40  Pulse: 74 72 77   Resp: 20 20 18    Temp: 98.7 F (37.1 C)  98 F (36.7 C)   TempSrc: Oral  Oral   SpO2: 100% 100% 100%   Weight:      Height:       No intake or output data in the 24 hours ending 08/29/19 1914 Filed Weights   08/27/19 1117 08/27/19 1621  Weight: 74.8 kg 75.1 kg    Examination:  Constitutional: NAD, oriented, more animated today, actually smiled HEENT: conjunctivae and lids normal, EOMI CV: RRR. Distal pulses +2.  No cyanosis.  RESP: CTA B/L, normal respiratory effort  GI: active +BS, NTND Extremities: No effusions, edema, or tenderness in BLE SKIN: warm, dry and intact Neuro: II - XII grossly intact.  Sensation intact Psych: Better mood and affect.     Data Reviewed: I have personally reviewed following labs and imaging  studies  CBC: Recent Labs  Lab 08/25/19 1230 08/27/19 1151 08/28/19 0533 08/29/19 0619  WBC 5.1 4.6 2.7* 3.4*  NEUTROABS 3.3 3.1  --   --   HGB 8.1* 7.4* 6.1* 7.6*  HCT 28.8* 27.4* 22.1* 27.0*  MCV 81.1 84.0 82.5 82.8  PLT 56* 50* 42* 41*   Basic Metabolic Panel: Recent Labs  Lab 08/25/19 1230 08/27/19 1151 08/28/19 0533 08/29/19 0619  NA 134* 136 138 137  K 4.0 3.7 3.5 3.3*  CL 102 107 110 111  CO2 20* 15* 20* 20*  GLUCOSE 170* 114* 124* 168*  BUN 32* 38* 38* 29*  CREATININE 1.56* 1.85* 1.61* 1.22*  CALCIUM 9.8 9.9 8.9 8.5*   GFR: Estimated Creatinine Clearance: 46.3 mL/min (A) (by C-G formula based on SCr of 1.22 mg/dL (H)). Liver Function Tests: Recent Labs  Lab 08/25/19 1230 08/27/19 1151 08/28/19 0533 08/29/19 0619  AST 29 34 32 41  ALT 28 28 26  32  ALKPHOS 106 90 77 81  BILITOT 2.9* 2.6* 1.9* 2.1*  PROT 6.1* 5.4* 4.8* 5.0*  ALBUMIN 2.9* 2.7* 2.3* 2.4*   No results for input(s): LIPASE, AMYLASE in the last 168 hours. Recent Labs  Lab 08/25/19 1230 08/27/19 1151 08/29/19 0619  AMMONIA 40* 87* 116*   Coagulation Profile: Recent Labs  Lab 08/28/19 0533 08/29/19 0619  INR 1.8* 1.8*   Cardiac Enzymes: No results for input(s): CKTOTAL, CKMB, CKMBINDEX, TROPONINI in the last 168 hours. BNP (last 3 results) No results for input(s): PROBNP in the last 8760 hours. HbA1C: No results for input(s): HGBA1C in the last 72 hours. CBG: Recent Labs  Lab 08/28/19 1712 08/28/19 2321 08/29/19 0550 08/29/19 1245 08/29/19 1757  GLUCAP 157* 183* 167* 162* 155*   Lipid Profile: No results for input(s): CHOL, HDL, LDLCALC, TRIG, CHOLHDL, LDLDIRECT in the last 72 hours. Thyroid Function Tests: No results for input(s): TSH, T4TOTAL, FREET4, T3FREE, THYROIDAB in the last 72 hours. Anemia Panel: No results for input(s): VITAMINB12, FOLATE, FERRITIN, TIBC, IRON, RETICCTPCT in the last 72 hours. Sepsis Labs: No results for input(s): PROCALCITON, LATICACIDVEN  in the last 168 hours.  Recent Results (from the past 240 hour(s))  SARS CORONAVIRUS 2 (TAT 6-24 HRS) Nasopharyngeal Nasopharyngeal Swab     Status: None   Collection Time: 08/25/19  1:14 PM   Specimen: Nasopharyngeal Swab  Result Value Ref Range Status   SARS Coronavirus 2 NEGATIVE NEGATIVE Final    Comment: (NOTE) SARS-CoV-2 target nucleic acids are NOT DETECTED. The SARS-CoV-2 RNA is generally detectable in upper and lower respiratory specimens during the acute phase of infection. Negative results do not preclude SARS-CoV-2 infection, do not rule out co-infections with other pathogens, and should not be used as the sole basis for treatment or other patient management decisions. Negative results must be combined with clinical observations, patient history, and epidemiological information. The expected result is Negative. Fact Sheet for Patients: SugarRoll.be Fact Sheet for Healthcare Providers: https://www.woods-mathews.com/ This test is not yet approved or cleared by the [place] FDA and  has been authorized for detection and/or diagnosis of SARS-CoV-2 by FDA under an Emergency Use Authorization (EUA). This EUA will remain  in effect (meaning this  test can be used) for the duration of the COVID-19 declaration under Section 56 4(b)(1) of the Act, 21 U.S.C. section 360bbb-3(b)(1), unless the authorization is terminated or revoked sooner. Performed at [facility], [place] [address], [place], [place] [address]   SARS CORONAVIRUS 2 (TAT 6-24 HRS) Nasopharyngeal Nasopharyngeal Swab     Status: Abnormal   Collection Time: 08/27/19  2:19 PM   Specimen: Nasopharyngeal Swab  Result Value Ref Range Status   SARS Coronavirus 2 POSITIVE (A) NEGATIVE Final    Comment: RESULT CALLED TO, READ BACK BY AND VERIFIED WITH: Junious Silk RN 9:30 08/28/19 ([email or web address]) (NOTE) SARS-CoV-2 target nucleic acids are DETECTED. The SARS-CoV-2 RNA is generally  detectable in upper and lower respiratory specimens during the acute phase of infection. Positive results are indicative of the presence of SARS-CoV-2 RNA. Clinical correlation with patient history and other diagnostic information is  necessary to determine patient infection status. Positive results do not rule out bacterial infection or co-infection with other viruses.  The expected result is Negative. Fact Sheet for Patients: SugarRoll.be Fact Sheet for Healthcare Providers: https://www.woods-mathews.com/ This test is not yet approved or cleared by the [place] FDA and  has been authorized for detection and/or diagnosis of SARS-CoV-2 by FDA under an Emergency Use Authorization (EUA). This EUA will remain  in effect (meaning this test can be used) for th e duration of the COVID-19 declaration under Section 564(b)(1) of the Act, 21 U.S.C. section 360bbb-3(b)(1), unless the authorization is terminated or revoked sooner. Performed at [facility], [place] [address], [place], [place] [address]          Radiology Studies: No results found.      Scheduled Meds: . atenolol  12.5 mg Oral Daily  . Chlorhexidine Gluconate Cloth  6 each Topical Daily  . insulin aspart  0-9 Units Subcutaneous TID WC  . lactulose  30 g Oral 6 X Daily  . levothyroxine  25 mcg Oral Q0600  . pantoprazole  40 mg Oral BID AC  . rifaximin  550 mg Oral BID   Continuous Infusions:   LOS: 2 days    Enzo Bi, MD [facility] If 7PM-7AM, please contact night-coverage 08/29/2019, 7:14 PM

## 2019-08-29 NOTE — Progress Notes (Addendum)
Husband has called several times this shift.  Patient husband has expressed many concerns about wife and has many questions.  Patient expressed desire to receive phone call from MD about patient care.  Will notify daytime RN of husband concerns.    --------------------------------------------   This is the first received request to call the husband.  I have called the husband, updated him and answered his questions.    Enzo Bi, MD

## 2019-08-30 LAB — CBC
HCT: 26.7 % — ABNORMAL LOW (ref 36.0–46.0)
Hemoglobin: 7.5 g/dL — ABNORMAL LOW (ref 12.0–15.0)
MCH: 23 pg — ABNORMAL LOW (ref 26.0–34.0)
MCHC: 28.1 g/dL — ABNORMAL LOW (ref 30.0–36.0)
MCV: 81.9 fL (ref 80.0–100.0)
Platelets: 21 10*3/uL — CL (ref 150–400)
RBC: 3.26 MIL/uL — ABNORMAL LOW (ref 3.87–5.11)
RDW: 18.2 % — ABNORMAL HIGH (ref 11.5–15.5)
WBC: 2.9 10*3/uL — ABNORMAL LOW (ref 4.0–10.5)
nRBC: 0 % (ref 0.0–0.2)

## 2019-08-30 LAB — BASIC METABOLIC PANEL
Anion gap: 9 (ref 5–15)
BUN: 27 mg/dL — ABNORMAL HIGH (ref 8–23)
CO2: 17 mmol/L — ABNORMAL LOW (ref 22–32)
Calcium: 8.2 mg/dL — ABNORMAL LOW (ref 8.9–10.3)
Chloride: 110 mmol/L (ref 98–111)
Creatinine, Ser: 1.12 mg/dL — ABNORMAL HIGH (ref 0.44–1.00)
GFR calc Af Amer: >60 mL/min (ref >60)
GFR calc non Af Amer: 52 mL/min — ABNORMAL LOW (ref >60)
Glucose, Bld: 207 mg/dL — ABNORMAL HIGH (ref 70–99)
Potassium: 3.2 mmol/L — ABNORMAL LOW (ref 3.5–5.1)
Sodium: 136 mmol/L (ref 135–145)

## 2019-08-30 LAB — GLUCOSE, CAPILLARY
Glucose-Capillary: 142 mg/dL — ABNORMAL HIGH (ref 70–99)
Glucose-Capillary: 150 mg/dL — ABNORMAL HIGH (ref 70–99)
Glucose-Capillary: 166 mg/dL — ABNORMAL HIGH (ref 70–99)
Glucose-Capillary: 199 mg/dL — ABNORMAL HIGH (ref 70–99)
Glucose-Capillary: 210 mg/dL — ABNORMAL HIGH (ref 70–99)

## 2019-08-30 LAB — MAGNESIUM: Magnesium: 2 mg/dL (ref 1.7–2.4)

## 2019-08-30 MED ORDER — LACTULOSE 10 GM/15ML PO SOLN
40.0000 g | Freq: Three times a day (TID) | ORAL | Status: DC
  Administered 2019-08-30 – 2019-09-02 (×12): 40 g via ORAL
  Filled 2019-08-30 (×13): qty 60

## 2019-08-30 NOTE — Progress Notes (Signed)
CRITICAL VALUE ALERT  Critical Value:  Plt 21  Date & Time Notied:  08/30/2019 1045  Provider Notified: Dr. Enzo Bi  Orders Received/Actions taken: No orders given at this time

## 2019-08-30 NOTE — Progress Notes (Signed)
PROGRESS NOTE    Maria [name]  [id number] DOB: September 15, 1956 DOA: 08/27/2019 PCP: Glenda Chroman, MD    Assessment & Plan:   Active Problems:   Hepatic encephalopathy (HCC)   Maria [name] is a 63 y.o. female with medical history significant of Hypothyroidism, Depression, Fibromyalgia, Hypertension, Dm2, NASH cirrhosis w esophageal varices, GAVE, s/p recent EGD 05/23/2019 at [facility] by Leafy Half tx with APC, chronic anemia requiring frequent transfusion who presented with AMS.  # Asymptomatic COVID infection --Monitor and put in isolation.  No tx necessary.  # Acute hepatic encephalopathy --Presented with AMS, and ammonia 87.  Husband and Maria [name] reported compliance with lactulose and having bowel movement, however, prior home dose of 20g QID may not be enough.  Increased dose of 30g QID since presentation was not enough either.  Bowel achieved with enema. PLAN:  --continue lactulose to 30g 6 times per day, and ensure at least 3 BM's per day. --No need to check ammonia level daily, treatment is based on response to improve mental status.  # AKI, improved --Cr 1.85 on presentation (baseline 0.9), likely 2/2 volume depletion from diuretic and/or poor PO intake. --Hold home torsemide and aldactone for now. --Encourage hydration via PO, avoid IVF for now  # NASH-related liver cirrhosis --continue lactulose at increased dose and home rifaximin --continue home atenolol --Hold home torsemide and aldactone for now 2/2 AKI --Follow-up with Dr. Laural Golden as outpatient  #ChronicAnemia --Hgb 7.4 on presentation, baseline 6-7.  Due toongoingchronic GI blood loss, patient with longstanding history of GI bleed secondary to GAVE/esophageal varices in the setting of [place] related liver cirrhosis --Recently admitted 06/01/2019 with hemoglobin of 5.9.  Requires frequent transfusions.  --Received 1u pRBC on 12/10 for Hgb 6.1 --Monitor CBC and transfuse to keep Hgb >=7.  # Difficult IV  access s/p Port-A-Cath placement on 06/02/2019  # DM2 --no recent A1c available --SSI TID with meals  # Hypothyroidism --continue levothyroxine 25 mcg daily  # Depression andAnxiety --Hold home Zoloft 50 mg daily and buspirone for now until mental status back to baseline  # Chronic hypoxic respiratory failure on 2 L nightly, stable --currently sating well on room air  # Hx of persistent hypokalemia and hypomagnesemia --due to GI losses in the patient with liver cirrhosis on lactulose as well as renal losses due to need for diuretic --Replete PRN   DVT prophylaxis: SCD/Compression stockings Code Status: Full code  Family Communication: not today Disposition Plan: Home, not sure when Consults called: none Admission status: Inpatient   Subjective: Maria [name] reported vomiting after eating, and said there was blood with her vomit, however, nursing did not note this finding.  Maria [name] reported loose BM's, and 5 charted, however, still felt sluggish.  No fever, dyspnea, chest pain, dysuria, increased swelling.   Objective: Vitals:   08/29/19 2103 08/30/19 0400 08/30/19 0609 08/30/19 1342  BP: (!) 106/46  (!) 112/36 (!) 119/45  Pulse: 60  68 63  Resp: 18  16 18   Temp: 98.4 F (36.9 C)  97.9 F (36.6 C) 98 F (36.7 C)  TempSrc:    Oral  SpO2: 100%  98% 100%  Weight:  78.3 kg    Height:        Intake/Output Summary (Last 24 hours) at 08/30/2019 2035 Last data filed at 08/30/2019 1809 Gross per 24 hour  Intake 480 ml  Output --  Net 480 ml   Filed Weights   08/27/19 1117 08/27/19 1621 08/30/19 0400  Weight: 74.8 kg  75.1 kg 78.3 kg    Examination:  Constitutional: NAD, oriented, sluggish and slow to respond HEENT: conjunctivae and lids normal, EOMI CV: RRR. Distal pulses +2.  No cyanosis.   RESP: CTA B/L, normal respiratory effort  GI: active +BS, NTND Extremities: No effusions, edema, or tenderness in BLE SKIN: warm, dry and intact Neuro: II - XII grossly intact.   Sensation intact Psych: Flat mood and affect.     Data Reviewed: I have personally reviewed following labs and imaging studies  CBC: Recent Labs  Lab 08/25/19 1230 08/27/19 1151 08/28/19 0533 08/29/19 0619 08/30/19 0956  WBC 5.1 4.6 2.7* 3.4* 2.9*  NEUTROABS 3.3 3.1  --   --   --   HGB 8.1* 7.4* 6.1* 7.6* 7.5*  HCT 28.8* 27.4* 22.1* 27.0* 26.7*  MCV 81.1 84.0 82.5 82.8 81.9  PLT 56* 50* 42* 41* 21*   Basic Metabolic Panel: Recent Labs  Lab 08/25/19 1230 08/27/19 1151 08/28/19 0533 08/29/19 0619 08/30/19 0956  NA 134* 136 138 137 136  K 4.0 3.7 3.5 3.3* 3.2*  CL 102 107 110 111 110  CO2 20* 15* 20* 20* 17*  GLUCOSE 170* 114* 124* 168* 207*  BUN 32* 38* 38* 29* 27*  CREATININE 1.56* 1.85* 1.61* 1.22* 1.12*  CALCIUM 9.8 9.9 8.9 8.5* 8.2*  MG  --   --   --   --  2.0   GFR: Estimated Creatinine Clearance: 51.3 mL/min (A) (by C-G formula based on SCr of 1.12 mg/dL (H)). Liver Function Tests: Recent Labs  Lab 08/25/19 1230 08/27/19 1151 08/28/19 0533 08/29/19 0619  AST 29 34 32 41  ALT 28 28 26  32  ALKPHOS 106 90 77 81  BILITOT 2.9* 2.6* 1.9* 2.1*  PROT 6.1* 5.4* 4.8* 5.0*  ALBUMIN 2.9* 2.7* 2.3* 2.4*   No results for input(s): LIPASE, AMYLASE in the last 168 hours. Recent Labs  Lab 08/25/19 1230 08/27/19 1151 08/29/19 0619  AMMONIA 40* 87* 116*   Coagulation Profile: Recent Labs  Lab 08/28/19 0533 08/29/19 0619  INR 1.8* 1.8*   Cardiac Enzymes: No results for input(s): CKTOTAL, CKMB, CKMBINDEX, TROPONINI in the last 168 hours. BNP (last 3 results) No results for input(s): PROBNP in the last 8760 hours. HbA1C: No results for input(s): HGBA1C in the last 72 hours. CBG: Recent Labs  Lab 08/29/19 1757 08/29/19 2100 08/30/19 0806 08/30/19 1221 08/30/19 1710  GLUCAP 155* 149* 210* 150* 199*   Lipid Profile: No results for input(s): CHOL, HDL, LDLCALC, TRIG, CHOLHDL, LDLDIRECT in the last 72 hours. Thyroid Function Tests: No results for  input(s): TSH, T4TOTAL, FREET4, T3FREE, THYROIDAB in the last 72 hours. Anemia Panel: No results for input(s): VITAMINB12, FOLATE, FERRITIN, TIBC, IRON, RETICCTPCT in the last 72 hours. Sepsis Labs: No results for input(s): PROCALCITON, LATICACIDVEN in the last 168 hours.  Recent Results (from the past 240 hour(s))  SARS CORONAVIRUS 2 (TAT 6-24 HRS) Nasopharyngeal Nasopharyngeal Swab     Status: None   Collection Time: 08/25/19  1:14 PM   Specimen: Nasopharyngeal Swab  Result Value Ref Range Status   SARS Coronavirus 2 NEGATIVE NEGATIVE Final    Comment: (NOTE) SARS-CoV-2 target nucleic acids are NOT DETECTED. The SARS-CoV-2 RNA is generally detectable in upper and lower respiratory specimens during the acute phase of infection. Negative results do not preclude SARS-CoV-2 infection, do not rule out co-infections with other pathogens, and should not be used as the sole basis for treatment or other patient management decisions. Negative results  must be combined with clinical observations, patient history, and epidemiological information. The expected result is Negative. Fact Sheet for Patients: SugarRoll.be Fact Sheet for Healthcare Providers: https://www.woods-mathews.com/ This test is not yet approved or cleared by the [place] FDA and  has been authorized for detection and/or diagnosis of SARS-CoV-2 by FDA under an Emergency Use Authorization (EUA). This EUA will remain  in effect (meaning this test can be used) for the duration of the COVID-19 declaration under Section 56 4(b)(1) of the Act, 21 U.S.C. section 360bbb-3(b)(1), unless the authorization is terminated or revoked sooner. Performed at [facility], [place] [address], [place], [place] [address]   SARS CORONAVIRUS 2 (TAT 6-24 HRS) Nasopharyngeal Nasopharyngeal Swab     Status: Abnormal   Collection Time: 08/27/19  2:19 PM   Specimen: Nasopharyngeal Swab  Result Value Ref  Range Status   SARS Coronavirus 2 POSITIVE (A) NEGATIVE Final    Comment: RESULT CALLED TO, READ BACK BY AND VERIFIED WITH: Junious Silk RN 9:30 08/28/19 ([email or web address]) (NOTE) SARS-CoV-2 target nucleic acids are DETECTED. The SARS-CoV-2 RNA is generally detectable in upper and lower respiratory specimens during the acute phase of infection. Positive results are indicative of the presence of SARS-CoV-2 RNA. Clinical correlation with patient history and other diagnostic information is  necessary to determine patient infection status. Positive results do not rule out bacterial infection or co-infection with other viruses.  The expected result is Negative. Fact Sheet for Patients: SugarRoll.be Fact Sheet for Healthcare Providers: https://www.woods-mathews.com/ This test is not yet approved or cleared by the [place] FDA and  has been authorized for detection and/or diagnosis of SARS-CoV-2 by FDA under an Emergency Use Authorization (EUA). This EUA will remain  in effect (meaning this test can be used) for th e duration of the COVID-19 declaration under Section 564(b)(1) of the Act, 21 U.S.C. section 360bbb-3(b)(1), unless the authorization is terminated or revoked sooner. Performed at [facility], [place] [address] [place], [place] [address]          Radiology Studies: No results found.      Scheduled Meds: . atenolol  12.5 mg Oral Daily  . Chlorhexidine Gluconate Cloth  6 each Topical Daily  . insulin aspart  0-9 Units Subcutaneous TID WC  . lactulose  40 g Oral TID WC & HS  . levothyroxine  25 mcg Oral Q0600  . pantoprazole  40 mg Oral BID AC  . rifaximin  550 mg Oral BID   Continuous Infusions:   LOS: 3 days    Enzo Bi, MD [facility] If 7PM-7AM, please contact night-coverage 08/30/2019, 8:35 PM

## 2019-08-30 NOTE — Progress Notes (Addendum)
Patient is alert and oriented x4. Patient offers no complaints. Patient has ambulated to [facility] once for moderate size BM this morning. Have updated husband several times throughout the day on patient status. Will continue to monitor.

## 2019-08-31 LAB — CBC
HCT: 26.8 % — ABNORMAL LOW (ref 36.0–46.0)
Hemoglobin: 7.6 g/dL — ABNORMAL LOW (ref 12.0–15.0)
MCH: 23.4 pg — ABNORMAL LOW (ref 26.0–34.0)
MCHC: 28.4 g/dL — ABNORMAL LOW (ref 30.0–36.0)
MCV: 82.5 fL (ref 80.0–100.0)
Platelets: 38 10*3/uL — ABNORMAL LOW (ref 150–400)
RBC: 3.25 MIL/uL — ABNORMAL LOW (ref 3.87–5.11)
RDW: 18.1 % — ABNORMAL HIGH (ref 11.5–15.5)
WBC: 2.7 10*3/uL — ABNORMAL LOW (ref 4.0–10.5)
nRBC: 0 % (ref 0.0–0.2)

## 2019-08-31 LAB — BASIC METABOLIC PANEL
Anion gap: 9 (ref 5–15)
BUN: 24 mg/dL — ABNORMAL HIGH (ref 8–23)
CO2: 17 mmol/L — ABNORMAL LOW (ref 22–32)
Calcium: 8.3 mg/dL — ABNORMAL LOW (ref 8.9–10.3)
Chloride: 107 mmol/L (ref 98–111)
Creatinine, Ser: 1.11 mg/dL — ABNORMAL HIGH (ref 0.44–1.00)
GFR calc Af Amer: >60 mL/min (ref >60)
GFR calc non Af Amer: 52 mL/min — ABNORMAL LOW (ref >60)
Glucose, Bld: 264 mg/dL — ABNORMAL HIGH (ref 70–99)
Potassium: 3.1 mmol/L — ABNORMAL LOW (ref 3.5–5.1)
Sodium: 133 mmol/L — ABNORMAL LOW (ref 135–145)

## 2019-08-31 LAB — AMMONIA: Ammonia: 59 umol/L — ABNORMAL HIGH (ref 9–35)

## 2019-08-31 LAB — GLUCOSE, CAPILLARY
Glucose-Capillary: 121 mg/dL — ABNORMAL HIGH (ref 70–99)
Glucose-Capillary: 239 mg/dL — ABNORMAL HIGH (ref 70–99)
Glucose-Capillary: 166 mg/dL — ABNORMAL HIGH (ref 70–99)
Glucose-Capillary: 177 mg/dL — ABNORMAL HIGH (ref 70–99)

## 2019-08-31 LAB — MAGNESIUM: Magnesium: 2.1 mg/dL (ref 1.7–2.4)

## 2019-08-31 NOTE — Progress Notes (Signed)
PROGRESS NOTE    Maria [name]  QVZ:[id number] DOB: 01-24-1956 DOA: 08/27/2019 PCP: Glenda Chroman, MD    Assessment & Plan:   Active Problems:   Hepatic encephalopathy (HCC)   Maria [name] is a 63 y.o. female with medical history significant of Hypothyroidism, Depression, Fibromyalgia, Hypertension, Dm2, NASH cirrhosis w esophageal varices, GAVE, s/p recent EGD 05/23/2019 at [facility] by Leafy Half tx with APC, chronic anemia requiring frequent transfusion who presented with AMS.  # Asymptomatic COVID infection --Monitor and put in isolation.  No tx necessary.  # Acute hepatic encephalopathy, improved --Presented with AMS, and ammonia 87.  Husband and pt reported compliance with lactulose and having bowel movement, however, prior home dose of 20g QID may not be enough.  Increased dose of 30g QID since presentation was not enough either.  Bowel achieved with enema.  Today, ammonia level had gone down to 50's.   PLAN:  --continue lactulose to 30g 6 times per day, and ensure at least 3 BM's per day. --No need to check ammonia level daily, treatment is based on response to improve mental status.  # AKI, resolved --Cr 1.85 on presentation (baseline 0.9), likely 2/2 volume depletion from diuretic and/or poor PO intake. --Hold home torsemide and aldactone for now. --Encourage hydration via PO, avoid IVF for now  # NASH-related liver cirrhosis --continue lactulose at increased dose and home rifaximin --continue home atenolol --Hold home torsemide and aldactone for now 2/2 AKI --Follow-up with Dr. Laural Golden as outpatient  #ChronicAnemia --Hgb 7.4 on presentation, baseline 6-7.  Due toongoingchronic GI blood loss, patient with longstanding history of GI bleed secondary to GAVE/esophageal varices in the setting of [place] related liver cirrhosis --Recently admitted 06/01/2019 with hemoglobin of 5.9.  Requires frequent transfusions.  --Received 1u pRBC on 12/10 for Hgb 6.1 --Monitor  CBC and transfuse to keep Hgb >=7.  # Difficult IV access s/p Port-A-Cath placement on 06/02/2019  # DM2 --no recent A1c available --SSI TID with meals  # Hypothyroidism --continue levothyroxine 25 mcg daily  # Depression andAnxiety --Hold home Zoloft 50 mg daily and buspirone for now until mental status back to baseline  # Chronic hypoxic respiratory failure on 2 L nightly, stable --currently sating well on room air  # Hx of persistent hypokalemia and hypomagnesemia --due to GI losses in the patient with liver cirrhosis on lactulose as well as renal losses due to need for diuretic --Replete PRN   DVT prophylaxis: SCD/Compression stockings Code Status: Full code  Family Communication: not today Disposition Plan: Home, maybe tomorrow Consults called: none Admission status: Inpatient   Subjective: Pt has been having multiple BM's per day, ammonia level had done down to 50's.  Pt complained of pain on her right side, and trouble keeping her food down.  No fever, dyspnea, chest pain, dysuria, increased swelling.   Objective: Vitals:   08/30/19 2300 08/31/19 0419 08/31/19 0453 08/31/19 1432  BP: (!) 116/50  (!) 113/36 (!) 118/45  Pulse: 60  66 74  Resp: 19  16 16   Temp: 98.3 F (36.8 C)  98.4 F (36.9 C) 98 F (36.7 C)  TempSrc: Oral   Oral  SpO2: 98%  98% 100%  Weight:  79 kg    Height:        Intake/Output Summary (Last 24 hours) at 08/31/2019 2125 Last data filed at 08/31/2019 1700 Gross per 24 hour  Intake 720 ml  Output --  Net 720 ml   [employer]   08/27/19  1621 08/30/19 0400 08/31/19 0419  Weight: 75.1 kg 78.3 kg 79 kg    Examination:  Constitutional: NAD, oriented, coherent, slightly more animated.   HEENT: conjunctivae and lids normal, EOMI CV: RRR. Distal pulses +2.  No cyanosis.   RESP: CTA B/L, normal respiratory effort  GI: active +BS, NTND Extremities: No effusions, edema, or tenderness in BLE SKIN: warm, dry and intact Neuro:  II - XII grossly intact.  Sensation intact Psych: Flat mood and affect.     Data Reviewed: I have personally reviewed following labs and imaging studies  CBC: Recent Labs  Lab 08/25/19 1230 08/27/19 1151 08/28/19 0533 08/29/19 0619 08/30/19 0956 08/31/19 1349  WBC 5.1 4.6 2.7* 3.4* 2.9* 2.7*  NEUTROABS 3.3 3.1  --   --   --   --   HGB 8.1* 7.4* 6.1* 7.6* 7.5* 7.6*  HCT 28.8* 27.4* 22.1* 27.0* 26.7* 26.8*  MCV 81.1 84.0 82.5 82.8 81.9 82.5  PLT 56* 50* 42* 41* 21* 38*   Basic Metabolic Panel: Recent Labs  Lab 08/27/19 1151 08/28/19 0533 08/29/19 0619 08/30/19 0956 08/31/19 1349  NA 136 138 137 136 133*  K 3.7 3.5 3.3* 3.2* 3.1*  CL 107 110 111 110 107  CO2 15* 20* 20* 17* 17*  GLUCOSE 114* 124* 168* 207* 264*  BUN 38* 38* 29* 27* 24*  CREATININE 1.85* 1.61* 1.22* 1.12* 1.11*  CALCIUM 9.9 8.9 8.5* 8.2* 8.3*  MG  --   --   --  2.0 2.1   GFR: Estimated Creatinine Clearance: 52.1 mL/min (A) (by C-G formula based on SCr of 1.11 mg/dL (H)). Liver Function Tests: Recent Labs  Lab 08/25/19 1230 08/27/19 1151 08/28/19 0533 08/29/19 0619  AST 29 34 32 41  ALT 28 28 26  32  ALKPHOS 106 90 77 81  BILITOT 2.9* 2.6* 1.9* 2.1*  PROT 6.1* 5.4* 4.8* 5.0*  ALBUMIN 2.9* 2.7* 2.3* 2.4*   No results for input(s): LIPASE, AMYLASE in the last 168 hours. Recent Labs  Lab 08/25/19 1230 08/27/19 1151 08/29/19 0619 08/31/19 1349  AMMONIA 40* 87* 116* 59*   Coagulation Profile: Recent Labs  Lab 08/28/19 0533 08/29/19 0619  INR 1.8* 1.8*   Cardiac Enzymes: No results for input(s): CKTOTAL, CKMB, CKMBINDEX, TROPONINI in the last 168 hours. BNP (last 3 results) No results for input(s): PROBNP in the last 8760 hours. HbA1C: No results for input(s): HGBA1C in the last 72 hours. CBG: Recent Labs  Lab 08/30/19 2107 08/30/19 2339 08/31/19 0829 08/31/19 1212 08/31/19 1725  GLUCAP 142* 166* 121* 239* 166*   Lipid Profile: No results for input(s): CHOL, HDL, LDLCALC,  TRIG, CHOLHDL, LDLDIRECT in the last 72 hours. Thyroid Function Tests: No results for input(s): TSH, T4TOTAL, FREET4, T3FREE, THYROIDAB in the last 72 hours. Anemia Panel: No results for input(s): VITAMINB12, FOLATE, FERRITIN, TIBC, IRON, RETICCTPCT in the last 72 hours. Sepsis Labs: No results for input(s): PROCALCITON, LATICACIDVEN in the last 168 hours.  Recent Results (from the past 240 hour(s))  SARS CORONAVIRUS 2 (TAT 6-24 HRS) Nasopharyngeal Nasopharyngeal Swab     Status: None   Collection Time: 08/25/19  1:14 PM   Specimen: Nasopharyngeal Swab  Result Value Ref Range Status   SARS Coronavirus 2 NEGATIVE NEGATIVE Final    Comment: (NOTE) SARS-CoV-2 target nucleic acids are NOT DETECTED. The SARS-CoV-2 RNA is generally detectable in upper and lower respiratory specimens during the acute phase of infection. Negative results do not preclude SARS-CoV-2 infection, do not rule out co-infections  with other pathogens, and should not be used as the sole basis for treatment or other patient management decisions. Negative results must be combined with clinical observations, patient history, and epidemiological information. The expected result is Negative. Fact Sheet for Patients: SugarRoll.be Fact Sheet for Healthcare Providers: https://www.woods-mathews.com/ This test is not yet approved or cleared by the [place] FDA and  has been authorized for detection and/or diagnosis of SARS-CoV-2 by FDA under an Emergency Use Authorization (EUA). This EUA will remain  in effect (meaning this test can be used) for the duration of the COVID-19 declaration under Section 56 4(b)(1) of the Act, 21 U.S.C. section 360bbb-3(b)(1), unless the authorization is terminated or revoked sooner. Performed at [facility], [place] [address], [place], [place] [address]   SARS CORONAVIRUS 2 (TAT 6-24 HRS) Nasopharyngeal Nasopharyngeal Swab     Status: Abnormal     Collection Time: 08/27/19  2:19 PM   Specimen: Nasopharyngeal Swab  Result Value Ref Range Status   SARS Coronavirus 2 POSITIVE (A) NEGATIVE Final    Comment: RESULT CALLED TO, READ BACK BY AND VERIFIED WITH: Junious Silk RN 9:30 08/28/19 ([email or web address]) (NOTE) SARS-CoV-2 target nucleic acids are DETECTED. The SARS-CoV-2 RNA is generally detectable in upper and lower respiratory specimens during the acute phase of infection. Positive results are indicative of the presence of SARS-CoV-2 RNA. Clinical correlation with patient history and other diagnostic information is  necessary to determine patient infection status. Positive results do not rule out bacterial infection or co-infection with other viruses.  The expected result is Negative. Fact Sheet for Patients: SugarRoll.be Fact Sheet for Healthcare Providers: https://www.woods-mathews.com/ This test is not yet approved or cleared by the [place] FDA and  has been authorized for detection and/or diagnosis of SARS-CoV-2 by FDA under an Emergency Use Authorization (EUA). This EUA will remain  in effect (meaning this test can be used) for th e duration of the COVID-19 declaration under Section 564(b)(1) of the Act, 21 U.S.C. section 360bbb-3(b)(1), unless the authorization is terminated or revoked sooner. Performed at [facility], [place] [address], [place], [place] [address]          Radiology Studies: No results found.      Scheduled Meds: . atenolol  12.5 mg Oral Daily  . Chlorhexidine Gluconate Cloth  6 each Topical Daily  . insulin aspart  0-9 Units Subcutaneous TID WC  . lactulose  40 g Oral TID WC & HS  . levothyroxine  25 mcg Oral Q0600  . pantoprazole  40 mg Oral BID AC  . rifaximin  550 mg Oral BID   Continuous Infusions:   LOS: 4 days    Enzo Bi, MD [facility] If 7PM-7AM, please contact night-coverage 08/31/2019, 9:25 PM

## 2019-09-01 ENCOUNTER — Encounter (HOSPITAL_COMMUNITY): Payer: PPO

## 2019-09-01 LAB — GLUCOSE, CAPILLARY
Glucose-Capillary: 139 mg/dL — ABNORMAL HIGH (ref 70–99)
Glucose-Capillary: 213 mg/dL — ABNORMAL HIGH (ref 70–99)
Glucose-Capillary: 224 mg/dL — ABNORMAL HIGH (ref 70–99)
Glucose-Capillary: 153 mg/dL — ABNORMAL HIGH (ref 70–99)

## 2019-09-01 LAB — COMPREHENSIVE METABOLIC PANEL
ALT: 37 U/L (ref 0–44)
AST: 45 U/L — ABNORMAL HIGH (ref 15–41)
Albumin: 2.2 g/dL — ABNORMAL LOW (ref 3.5–5.0)
Alkaline Phosphatase: 83 U/L (ref 38–126)
Anion gap: 5 (ref 5–15)
BUN: 23 mg/dL (ref 8–23)
CO2: 19 mmol/L — ABNORMAL LOW (ref 22–32)
Calcium: 8.1 mg/dL — ABNORMAL LOW (ref 8.9–10.3)
Chloride: 109 mmol/L (ref 98–111)
Creatinine, Ser: 1.1 mg/dL — ABNORMAL HIGH (ref 0.44–1.00)
GFR calc Af Amer: >60 mL/min (ref >60)
GFR calc non Af Amer: 53 mL/min — ABNORMAL LOW (ref >60)
Glucose, Bld: 252 mg/dL — ABNORMAL HIGH (ref 70–99)
Potassium: 3.2 mmol/L — ABNORMAL LOW (ref 3.5–5.1)
Sodium: 133 mmol/L — ABNORMAL LOW (ref 135–145)
Total Bilirubin: 1.4 mg/dL — ABNORMAL HIGH (ref 0.3–1.2)
Total Protein: 4.7 g/dL — ABNORMAL LOW (ref 6.5–8.1)

## 2019-09-01 LAB — CBC
HCT: 24.4 % — ABNORMAL LOW (ref 36.0–46.0)
Hemoglobin: 6.9 g/dL — CL (ref 12.0–15.0)
MCH: 23.2 pg — ABNORMAL LOW (ref 26.0–34.0)
MCHC: 28.3 g/dL — ABNORMAL LOW (ref 30.0–36.0)
MCV: 82.2 fL (ref 80.0–100.0)
Platelets: 35 10*3/uL — ABNORMAL LOW (ref 150–400)
RBC: 2.97 MIL/uL — ABNORMAL LOW (ref 3.87–5.11)
RDW: 17.9 % — ABNORMAL HIGH (ref 11.5–15.5)
WBC: 2.2 10*3/uL — ABNORMAL LOW (ref 4.0–10.5)
nRBC: 0 % (ref 0.0–0.2)

## 2019-09-01 LAB — MAGNESIUM: Magnesium: 2.2 mg/dL (ref 1.7–2.4)

## 2019-09-01 LAB — PREPARE RBC (CROSSMATCH)

## 2019-09-01 MED ORDER — ACETAMINOPHEN 325 MG PO TABS
325.0000 mg | ORAL_TABLET | Freq: Once | ORAL | Status: AC
  Administered 2019-09-01: 325 mg via ORAL
  Filled 2019-09-01: qty 1

## 2019-09-01 MED ORDER — SODIUM CHLORIDE 0.9% IV SOLUTION
Freq: Once | INTRAVENOUS | Status: AC
  Administered 2019-09-01: 17:00:00 via INTRAVENOUS

## 2019-09-01 NOTE — Progress Notes (Signed)
Patient requesting something for pain.  Dr. Darrick Meigs notified

## 2019-09-01 NOTE — Care Management Important Message (Signed)
Important Message  Patient Details  Name: Maria [name] MRN: [id number] Date of Birth: 01-22-1956   Medicare Important Message Given:  Yes(given to Network engineer for RN to deliver to patient due to precautions)     Tommy Medal 09/01/2019, 3:20 PM

## 2019-09-01 NOTE — Progress Notes (Signed)
CRITICAL VALUE ALERT  Critical Value:  Hemoglobin 6.9  Date & Time Notied:  09/01/19 1359  Provider Notified: Dr. Billie Ruddy text-paged.   Orders Received/Actions taken: No orders at this time.

## 2019-09-01 NOTE — Unmapped (Signed)
Return call placed and spoke with Feliberto Harts. Marilyn French admitted to [facility] s/p fall - being treated for HE and now testing (+) for Covid. Michael negative but quarantining @ home. Requested he contact me once she is released.

## 2019-09-02 LAB — BASIC METABOLIC PANEL
Anion gap: 5 (ref 5–15)
BUN: 22 mg/dL (ref 8–23)
CO2: 20 mmol/L — ABNORMAL LOW (ref 22–32)
Calcium: 8.4 mg/dL — ABNORMAL LOW (ref 8.9–10.3)
Chloride: 114 mmol/L — ABNORMAL HIGH (ref 98–111)
Creatinine, Ser: 1.05 mg/dL — ABNORMAL HIGH (ref 0.44–1.00)
GFR calc Af Amer: >60 mL/min (ref >60)
GFR calc non Af Amer: 56 mL/min — ABNORMAL LOW (ref >60)
Glucose, Bld: 195 mg/dL — ABNORMAL HIGH (ref 70–99)
Potassium: 3.2 mmol/L — ABNORMAL LOW (ref 3.5–5.1)
Sodium: 139 mmol/L (ref 135–145)

## 2019-09-02 LAB — CBC
HCT: 26.2 % — ABNORMAL LOW (ref 36.0–46.0)
Hemoglobin: 7.7 g/dL — ABNORMAL LOW (ref 12.0–15.0)
MCH: 24.7 pg — ABNORMAL LOW (ref 26.0–34.0)
MCHC: 29.4 g/dL — ABNORMAL LOW (ref 30.0–36.0)
MCV: 84 fL (ref 80.0–100.0)
Platelets: 27 10*3/uL — CL (ref 150–400)
RBC: 3.12 MIL/uL — ABNORMAL LOW (ref 3.87–5.11)
RDW: 17.6 % — ABNORMAL HIGH (ref 11.5–15.5)
WBC: 2.3 10*3/uL — ABNORMAL LOW (ref 4.0–10.5)
nRBC: 0 % (ref 0.0–0.2)

## 2019-09-02 LAB — TYPE AND SCREEN
ABO/RH(D): O NEG
Antibody Screen: NEGATIVE
Unit division: 0

## 2019-09-02 LAB — BPAM RBC
Blood Product Expiration Date: 202101092359
ISSUE DATE / TIME: 202012141753
Unit Type and Rh: 9500

## 2019-09-02 LAB — GLUCOSE, CAPILLARY
Glucose-Capillary: 147 mg/dL — ABNORMAL HIGH (ref 70–99)
Glucose-Capillary: 251 mg/dL — ABNORMAL HIGH (ref 70–99)

## 2019-09-02 MED ORDER — POTASSIUM CHLORIDE CRYS ER 20 MEQ PO TBCR: 40.0000 meq | EXTENDED_RELEASE_TABLET | Freq: Every day | ORAL | 2 refills | Status: DC

## 2019-09-02 MED ORDER — BUMETANIDE 2 MG PO TABS: ORAL_TABLET | ORAL | Status: DC

## 2019-09-02 MED ORDER — LACTULOSE 10 GM/15ML PO SOLN: 40.0000 g | Freq: Four times a day (QID) | ORAL | 2 refills | Status: AC

## 2019-09-02 MED ORDER — TORSEMIDE 20 MG PO TABS: ORAL_TABLET | ORAL | 0 refills | Status: DC

## 2019-09-02 MED ORDER — HEPARIN SOD (PORK) LOCK FLUSH 100 UNIT/ML IV SOLN
500.0000 [IU] | Freq: Once | INTRAVENOUS | Status: DC
  Filled 2019-09-02: qty 5

## 2019-09-02 MED ORDER — POTASSIUM CHLORIDE CRYS ER 20 MEQ PO TBCR
40.0000 meq | EXTENDED_RELEASE_TABLET | Freq: Once | ORAL | Status: AC
  Administered 2019-09-02: 40 meq via ORAL
  Filled 2019-09-02: qty 2

## 2019-09-02 NOTE — Discharge Summary (Signed)
Physician Discharge Summary   Maria [name]  female DOB: January 11, 1956  LHT:[id number]  PCP: Glenda Chroman, MD  Admit date: 08/27/2019 Discharge date: 09/02/2019  Admitted From: home Disposition:  home CODE STATUS: Full code  Discharge Instructions    Diet - low sodium heart healthy   Complete by: As directed    Discharge instructions   Complete by: As directed    I have increased your lactulose to 40 g 4 times per day.  If you start having liquidy stools >3 times per day, then can decrease lactulose to 3 times per day.  I have continued your Aldactone but held the other diuretics (fluid pills) because your blood pressure was low normal and you don't appear to have fluid buildup.  Please follow up with your outpatient doctor to see if/when you need to resume.    Because I have reduced your fluid pills, I have also reduced your potassium supplement to 40 mEq once a day.   Dr. Enzo Bi   Increase activity slowly   Complete by: As directed        Hospital Course:  For full details, please see H&P, progress notes, consult notes and ancillary notes.  Briefly,  Maria Emigh McAlisteris a 63 y.o. Caucasianfemalewith medical history significant ofHypothyroidism, Depression, Fibromyalgia, Hypertension, Dm2, NASH cirrhosis w esophageal varices, GAVE, s/p recent EGD 05/23/2019 at [facility] by Leafy Half tx with APC, chronic anemia requiring frequent transfusion who presented with AMS.  #Acute hepatic encephalopathy, resolved Presented with AMS, and ammonia 87. Husband gave pt her home lactulose and said pt reported having bowel movements, however, prior home dose of 20g QID may not be enough.  Increased dose of 30g QID since presentation was not enough either.  Pt apparently was constipated, so enema was ordered which finally resulted in bowel movement.  Lactulose was increased to 40g QID which produced 4-5 BM's per day.   Ammonia level had gone down to 50's.  Per husband, pt was finally  back to her normal self 1-2 days prior to discharge.  Pt was discharged on increased dose of Lactulose 40 g QID and instructed to reduce to TID if BM's > 3 per day or becoming too watery.    # Asymptomatic COVID infection Pt was monitored and put in isolation.  Pt was asymptomatic, with no fever, no cough, no hypoxia.  Pt's husband tested neg for COVID.  I have discussed with both pt and husband the isolation precautions they should practice at home until 14 days have passed.  # AKI, resolved Cr 1.85 on presentation (baseline 0.9), likely 2/2 volume depletion from diuretic and/or poor PO intake.  Home torsemide and aldactone held during the whole hospitalization, and pt didn't appear to accumulate fluid anywhere.  On discharge, home Aldactone continued, and other diuretics held until outpatient f/u.   # NASH-related liver cirrhosis Homerifaximin continued, and lactulose adjustment per above.  Continuedhomeatenolol. Home torsemide and aldactone held 2/2 AKI for the whole hospitalization, and pt didn't appear to accumulate fluid anywhere.  Pt said she had never had ascites.  On discharge, home Aldactone continued, and other diuretics held until outpatient f/u.  Pt will follow-up with Dr. Genevie Cheshire outpatient  #ChronicAnemia  # history of GI bleed secondary to GAVE Hgb 7.4 on presentation, baseline 6-7. Pt has longstanding history of chronic GI bleed secondary to GAVE/esophageal varices in the setting of Nash related liver cirrhosis and required frequent blood transfusions.  Received 1u pRBC on 12/10 for Hgb  6.1 and 1u pRBC on 12/14 for Hgb 6.9.  Pt's husband was concerned about the more frequent needs for blood transfusion which may be contributed by reduced hematopoiesis in the setting of acute illness.  I had discussed with pt's GI doctor Dr. Laural Golden who recommended no further inpatient workup; pt has weekly labs with him.  # Difficult IV accesss/pPort-A-Cath placement on  06/02/2019  #DM2 No recent A1c available.  SSI TID with meals  #Hypothyroidism Continude levothyroxine 25 mcg daily  #Depression andAnxiety HomeZoloft 50 mg daily and buspironeheld while inpatient due to AMS, both resumed at discharge.  # Chronic hypoxic respiratory failureon2 L nightly, stable Pt has been sating well on room air  # Hx ofpersistent hypokalemia and hypomagnesemia Due to GI losses in the patient with liver cirrhosis on lactulose as well as renal losses due to need for diuretic.  Repleted PRN.   Discharge Diagnoses:  Active Problems:   Hepatic encephalopathy [facility])   Discharge Instructions:  Allergies as of 09/02/2019      Reactions   Ferumoxytol Other (See Comments)   Patient has severe back and chest pain when receiving FERAHEME infusions.   Tramadol Hcl Other (See Comments)   WEAKNESS      Medication List    TAKE these medications   atenolol 25 MG tablet Commonly known as: Tenormin Take 0.5 tablets (12.5 mg total) by mouth daily.   bumetanide 2 MG tablet Commonly known as: BUMEX Hold until outpatient follow up. What changed:   how much to take  how to take this  when to take this  additional instructions   busPIRone 10 MG tablet Commonly known as: BUSPAR Take 10 mg by mouth 2 (two) times daily.   cholecalciferol 1000 units tablet Commonly known as: VITAMIN D Take 1,000 Units by mouth daily.   ergocalciferol 1.25 MG (50000 UT) capsule Commonly known as: VITAMIN D2 Take 1 tablet by mouth every other day.   FLINTSTONES PLUS IRON PO Take 2 tablets by mouth daily.   HumaLOG 100 UNIT/ML injection Generic drug: insulin lispro Inject 15-35 Units into the skin 3 (three) times daily with meals. Sliding scale insulin 100-150= 15 units 150-200= 20 units 250-300= 30 units 300-350= 35 units   Insulin Pen Needle 31G X 5 MM Misc Use with each insulin injection   lactulose 10 GM/15ML solution Commonly known as:  CHRONULAC Take 60 mLs (40 g total) by mouth 4 (four) times daily. If having watery stools >3 times per day, then reduce to 3 times daily. What changed: See the new instructions.   Lantus SoloStar 100 UNIT/ML Solostar Pen Generic drug: Insulin Glargine Inject 10-55 Units into the skin See admin instructions. Inject 55 units subcutaneously in the morning & 10 units subcutaneously at night.   levothyroxine 25 MCG tablet Commonly known as: SYNTHROID Take 25 mcg by mouth daily before breakfast.   lidocaine-prilocaine cream Commonly known as: EMLA Apply 1 application topically daily as needed.   magnesium oxide 400 MG tablet Commonly known as: MAG-OX Take 400 mg by mouth daily.   pantoprazole 40 MG tablet Commonly known as: PROTONIX Take 1 tablet (40 mg total) by mouth 2 (two) times daily before a meal.   potassium chloride SA 20 MEQ tablet Commonly known as: KLOR-CON Take 2 tablets (40 mEq total) by mouth daily.   rifaximin 550 MG Tabs tablet Commonly known as: XIFAXAN Take 1 tablet (550 mg total) by mouth 2 (two) times daily.   rosuvastatin 5 MG tablet Commonly known  as: CRESTOR Take 5 mg by mouth every other day.   sertraline 50 MG tablet Commonly known as: ZOLOFT Take 50 mg by mouth daily.   spironolactone 50 MG tablet Commonly known as: ALDACTONE Take 1 tablet by mouth twice daily What changed:   how much to take  how to take this  when to take this  additional instructions   sucralfate 1 GM/10ML suspension Commonly known as: CARAFATE Take 1 g by mouth 4 (four) times daily.   torsemide 20 MG tablet Commonly known as: DEMADEX Hold until outpatient followup. What changed:   how much to take  how to take this  when to take this  additional instructions   trimethoprim-polymyxin b ophthalmic solution Commonly known as: POLYTRIM Place 1 drop into the left eye See admin instructions. Use 3 to 4 times daily for 2 days following monthly eye injection    zinc sulfate 220 (50 Zn) MG capsule Take 1 capsule (220 mg total) by mouth daily.       Follow-up Information    [facility].   Specialty: Home Health Services Why:  PT       Glenda Chroman, MD Follow up on 09/08/2019.   Specialty: Internal Medicine Why: Please follow up with Dr. Woody Seller on Monday, December 21st at 2:45pm. Contact information: [place] [address] [phone]           Allergies  Allergen Reactions  . Ferumoxytol Other (See Comments)    Patient has severe back and chest pain when receiving FERAHEME infusions.   . Tramadol Hcl Other (See Comments)    WEAKNESS      The results of significant diagnostics from this hospitalization (including imaging, microbiology, ancillary and laboratory) are listed below for reference.   Consultations:   Procedures/Studies: CT Head Wo Contrast  Result Date: 08/27/2019 CLINICAL DATA:  Status post fall out of bed this morning. Confusion. Initial encounter. EXAM: CT HEAD WITHOUT CONTRAST TECHNIQUE: Contiguous axial images were obtained from the base of the skull through the vertex without intravenous contrast. COMPARISON:  Head CT scan 02/26/2019.  Brain MRI 07/19/2019. FINDINGS: Brain: No evidence of acute infarction, hemorrhage, hydrocephalus, extra-axial collection or mass lesion/mass effect. Vascular: Atherosclerosis. Skull: Intact.  No focal lesion. Sinuses/Orbits: The left maxillary sinus is completely opacified, unchanged. Other: None. IMPRESSION: No acute abnormality. Atherosclerosis. Completely opacified left maxillary sinus, unchanged. Electronically Signed   By: Inge Rise M.D.   On: 08/27/2019 14:06   DG Chest Port 1 View  Result Date: 08/25/2019 CLINICAL DATA:  Weakness EXAM: PORTABLE CHEST 1 VIEW COMPARISON:  06/02/2019 FINDINGS: There is a right-sided Port-A-Cath in satisfactory position. There is no focal consolidation. There is no pleural effusion or  pneumothorax. The heart and mediastinal contours are unremarkable. There is no acute osseous abnormality. IMPRESSION: No active disease. Electronically Signed   By: Kathreen Devoid   On: 08/25/2019 13:37      Labs: BNP (last 3 results) No results for input(s): BNP in the last 8760 hours. Basic Metabolic Panel: Recent Labs  Lab 08/30/19 0956 08/31/19 1349 09/01/19 1333 09/02/19 0426  NA 136 133* 133* 139  K 3.2* 3.1* 3.2* 3.2*  CL 110 107 109 114*  CO2 17* 17* 19* 20*  GLUCOSE 207* 264* 252* 195*  BUN 27* 24* 23 22  CREATININE 1.12* 1.11* 1.10* 1.05*  CALCIUM 8.2* 8.3* 8.1* 8.4*  MG 2.0 2.1 2.2  --    Liver Function Tests: Recent  Labs  Lab 09/01/19 1333  AST 45*  ALT 37  ALKPHOS 83  BILITOT 1.4*  PROT 4.7*  ALBUMIN 2.2*   No results for input(s): LIPASE, AMYLASE in the last 168 hours. Recent Labs  Lab 08/31/19 1349  AMMONIA 59*   CBC: Recent Labs  Lab 08/30/19 0956 08/31/19 1349 09/01/19 1333 09/02/19 0426  WBC 2.9* 2.7* 2.2* 2.3*  HGB 7.5* 7.6* 6.9* 7.7*  HCT 26.7* 26.8* 24.4* 26.2*  MCV 81.9 82.5 82.2 84.0  PLT 21* 38* 35* 27*   Cardiac Enzymes: No results for input(s): CKTOTAL, CKMB, CKMBINDEX, TROPONINI in the last 168 hours. BNP: Invalid input(s): POCBNP CBG: Recent Labs  Lab 09/01/19 1159 09/01/19 1608 09/01/19 2058 09/02/19 0840 09/02/19 1233  GLUCAP 213* 224* 153* 147* 251*   D-Dimer No results for input(s): DDIMER in the last 72 hours. Hgb A1c No results for input(s): HGBA1C in the last 72 hours. Lipid Profile No results for input(s): CHOL, HDL, LDLCALC, TRIG, CHOLHDL, LDLDIRECT in the last 72 hours. Thyroid function studies No results for input(s): TSH, T4TOTAL, T3FREE, THYROIDAB in the last 72 hours.  Invalid input(s): FREET3 Anemia work up No results for input(s): VITAMINB12, FOLATE, FERRITIN, TIBC, IRON, RETICCTPCT in the last 72 hours. Urinalysis    Component Value Date/Time   COLORURINE YELLOW 08/27/2019 1126    APPEARANCEUR HAZY (A) 08/27/2019 1126   LABSPEC 1.017 08/27/2019 1126   PHURINE 5.0 08/27/2019 1126   GLUCOSEU NEGATIVE 08/27/2019 1126   HGBUR SMALL (A) 08/27/2019 1126   [place] 08/27/2019 [place] 08/27/2019 1126   PROTEINUR NEGATIVE 08/27/2019 1126   NITRITE NEGATIVE 08/27/2019 1126   LEUKOCYTESUR NEGATIVE 08/27/2019 1126   Sepsis Labs Invalid input(s): PROCALCITONIN,  WBC,  LACTICIDVEN Microbiology Recent Results (from the past 240 hour(s))  SARS CORONAVIRUS 2 (TAT 6-24 HRS) Nasopharyngeal Nasopharyngeal Swab     Status: Abnormal   Collection Time: 08/27/19  2:19 PM   Specimen: Nasopharyngeal Swab  Result Value Ref Range Status   SARS Coronavirus 2 POSITIVE (A) NEGATIVE Final    Comment: RESULT CALLED TO, READ BACK BY AND VERIFIED WITH: Junious Silk RN 9:30 08/28/19 ([email or web address]) (NOTE) SARS-CoV-2 target nucleic acids are DETECTED. The SARS-CoV-2 RNA is generally detectable in upper and lower respiratory specimens during the acute phase of infection. Positive results are indicative of the presence of SARS-CoV-2 RNA. Clinical correlation with patient history and other diagnostic information is  necessary to determine patient infection status. Positive results do not rule out bacterial infection or co-infection with other viruses.  The expected result is Negative. Fact Sheet for Patients: SugarRoll.be Fact Sheet for Healthcare Providers: https://www.woods-mathews.com/ This test is not yet approved or cleared by the [place] FDA and  has been authorized for detection and/or diagnosis of SARS-CoV-2 by FDA under an Emergency Use Authorization (EUA). This EUA will remain  in effect (meaning this test can be used) for th e duration of the COVID-19 declaration under Section 564(b)(1) of the Act, 21 U.S.C. section 360bbb-3(b)(1), unless the authorization is terminated or revoked sooner. Performed at [facility], [place] [address] [place], [place] [address]      Total time spend on discharging this patient, including the last patient exam, discussing the hospital stay, instructions for ongoing care as it relates to all pertinent caregivers, as well as preparing the medical discharge records, prescriptions, and/or referrals as applicable, is 40 minutes.    Enzo Bi, MD  [facility] 09/05/2019, 6:07 PM  If  7PM-7AM, please contact night-coverage

## 2019-09-02 NOTE — TOC Transition Note (Signed)
Transition of Care [facility]) - CM/SW Discharge Note   Patient Details  Name: Maria [name] MRN: [id number] Date of Birth: [birth year]  Transition of Care [facility]) CM/SW Contact:  Tiffanni Scarfo, Chauncey Reading, RN Phone Number: 09/02/2019, 11:59 AM   Clinical Narrative:  Patient discharging home. Resuming home health with [facility]. Follow up appt scheduled.        Barriers to Discharge: Continued Medical Work up   Patient Goals and CMS Choice Patient states their goals for this hospitalization and ongoing recovery are:: to go home with [facility] PT.               Social Determinants of Health (SDOH) Interventions     Readmission Risk Interventions Readmission Risk Prevention Plan 09/01/2019 08/29/2019  Transportation Screening - Complete  PCP or Specialist Appt within 3-5 Days Complete -  HRI or Home Care Consult Complete -  Social Work Consult for [place] Planning/Counseling Complete -  Palliative Care Screening Not Applicable -  Medication Review Press photographer) - Complete  PCP or Specialist appointment within 3-5 days of discharge - Not Complete  HRI or [place] - Complete  SW Recovery Care/Counseling Consult - Complete  Palliative Care Screening - Not Complete  Skilled [place] - Not Complete  Some recent data might be hidden

## 2019-09-02 NOTE — Progress Notes (Signed)
Spoke with patient's husband regarding patient discharge. States he will be here at 2pm to pick her up.

## 2019-09-02 NOTE — Progress Notes (Signed)
CRITICAL VALUE ALERT  Critical Value:  Platelets 27  Date & Time Notied:  09/02/2019 0615  Provider Notified: Dr. Darrick Meigs

## 2019-09-02 NOTE — Progress Notes (Signed)
Late entry for 1345: Patient's portacath deaccessed and flushed with heparin per policy. No bleeding noted at site. Pt tolerated well.

## 2019-09-02 NOTE — Progress Notes (Signed)
Patient received PRBCs yesterday, but not AM labs ordered.  Requested CBC.

## 2019-09-02 NOTE — Unmapped (Signed)
Called pt's home and spoke with patient's spouse Mr. Dufresne.  Pt is currently hospitalized at [facility] due to high ammonia levels, per Mr. Glab, pt may come home today.  I will send Mr. Gammage instructions via MyChart to schedule a follow up appointment when she is home from the hospital.

## 2019-09-03 ENCOUNTER — Encounter (INDEPENDENT_AMBULATORY_CARE_PROVIDER_SITE_OTHER): Payer: PPO | Admitting: Ophthalmology

## 2019-09-05 NOTE — Progress Notes (Signed)
PROGRESS NOTE    Maria [name]  VOJ:[id number] DOB: [birth year] DOA: 08/27/2019 PCP: Glenda Chroman, MD    Assessment & Plan:   Active Problems:   Hepatic encephalopathy (HCC)   Maria [name] is a 63 y.o. female with medical history significant of Hypothyroidism, Depression, Fibromyalgia, Hypertension, Dm2, NASH cirrhosis w esophageal varices, GAVE, s/p recent EGD 05/23/2019 at [facility] by Leafy Half tx with APC, chronic anemia requiring frequent transfusion who presented with AMS.  # Asymptomatic COVID infection --Monitor and put in isolation.  No tx necessary.  # Acute hepatic encephalopathy, resolved --Presented with AMS, and ammonia 87.  Husband and pt reported compliance with lactulose and having bowel movements, however, prior home dose of 20g QID may not be enough.  Increased dose of 30g QID since presentation was not enough either.  Bowel movement achieved with enema, and finally started to have multiple BM's per day, and ammonia level had gone down to 50's.   PLAN:  --continue lactulose to 40g QID and ensure at least 3 BM's per day.  # AKI, resolved --Cr 1.85 on presentation (baseline 0.9), likely 2/2 volume depletion from diuretic and/or poor PO intake. --Hold home torsemide and aldactone for now. --Encourage hydration via PO, avoid IVF for now  # NASH-related liver cirrhosis --continue lactulose at increased dose and home rifaximin --continue home atenolol --Hold home torsemide and aldactone for now 2/2 AKI --Follow-up with Dr. Laural Golden as outpatient  #ChronicAnemia  # history of GI bleed secondary to GAVE --Hgb 7.4 on presentation, baseline 6-7.  Due toongoingchronic GI blood loss, patient with longstanding history of GI bleed secondary to GAVE/esophageal varices in the setting of [place] related liver cirrhosis --Recently admitted 06/01/2019 with hemoglobin of 5.9.  Requires frequent transfusions.  --Received 1u pRBC on 12/10 for Hgb 6.1 --1u pRBC today for Hgb  6.9 --Discussed with pt's GI doctor Dr. Laural Golden who recommended no further inpatient workup; pt has weekly labs with him.  # Difficult IV access s/p Port-A-Cath placement on 06/02/2019  # DM2 --no recent A1c available --SSI TID with meals  # Hypothyroidism --continue levothyroxine 25 mcg daily  # Depression andAnxiety --Hold home Zoloft 50 mg daily and buspirone for now until mental status back to baseline  # Chronic hypoxic respiratory failure on 2 L nightly, stable --currently sating well on room air  # Hx of persistent hypokalemia and hypomagnesemia --due to GI losses in the patient with liver cirrhosis on lactulose as well as renal losses due to need for diuretic --Replete PRN   DVT prophylaxis: SCD/Compression stockings Code Status: Full code  Family Communication: updated husband on the phone Disposition Plan: Home, tomorrow Consults called: Discussed with pt's outpatient GI doctor Admission status: Inpatient   Subjective: Pt continued to have multiple BM's per day, which are "loose" now.  No more vomiting, and had PO intake, but reported not wanting to eat much.  No fever, dyspnea, chest pain, dysuria, increased swelling.  Talked to husband on the phone, who said that pt sounded normal on the phone now, even remembering recent events.   Objective: Vitals:   09/01/19 2045 09/02/19 0457 09/02/19 1035 09/02/19 1118  BP: (!) 103/52 (!) 126/44  (!) 119/44  Pulse: 61 66  66  Resp: 16 16    Temp: 97.6 F (36.4 C) 98 F (36.7 C)    TempSrc: Oral Oral    SpO2: 100% 97% 96%   Weight:      Height:  No intake or output data in the 24 hours ending 09/05/19 1815 Filed Weights   08/27/19 1621 08/30/19 0400 08/31/19 0419  Weight: 75.1 kg 78.3 kg 79 kg    Examination:  Constitutional: NAD, oriented, coherent HEENT: conjunctivae and lids normal, EOMI CV: RRR. Distal pulses +2.  No cyanosis.  RESP: CTA B/L, normal respiratory effort  GI: active +BS,  NTND Extremities: No effusions, edema, or tenderness in BLE SKIN: warm, dry and intact Neuro: II - XII grossly intact.  Sensation intact Psych: Flat mood and affect.     Data Reviewed: I have personally reviewed following labs and imaging studies  CBC: Recent Labs  Lab 08/30/19 0956 08/31/19 1349 09/01/19 1333 09/02/19 0426  WBC 2.9* 2.7* 2.2* 2.3*  HGB 7.5* 7.6* 6.9* 7.7*  HCT 26.7* 26.8* 24.4* 26.2*  MCV 81.9 82.5 82.2 84.0  PLT 21* 38* 35* 27*   Basic Metabolic Panel: Recent Labs  Lab 08/30/19 0956 08/31/19 1349 09/01/19 1333 09/02/19 0426  NA 136 133* 133* 139  K 3.2* 3.1* 3.2* 3.2*  CL 110 107 109 114*  CO2 17* 17* 19* 20*  GLUCOSE 207* 264* 252* 195*  BUN 27* 24* 23 22  CREATININE 1.12* 1.11* 1.10* 1.05*  CALCIUM 8.2* 8.3* 8.1* 8.4*  MG 2.0 2.1 2.2  --    GFR: Estimated Creatinine Clearance: 55 mL/min (A) (by C-G formula based on SCr of 1.05 mg/dL (H)). Liver Function Tests: Recent Labs  Lab 09/01/19 1333  AST 45*  ALT 37  ALKPHOS 83  BILITOT 1.4*  PROT 4.7*  ALBUMIN 2.2*   No results for input(s): LIPASE, AMYLASE in the last 168 hours. Recent Labs  Lab 08/31/19 1349  AMMONIA 59*   Coagulation Profile: No results for input(s): INR, PROTIME in the last 168 hours. Cardiac Enzymes: No results for input(s): CKTOTAL, CKMB, CKMBINDEX, TROPONINI in the last 168 hours. BNP (last 3 results) No results for input(s): PROBNP in the last 8760 hours. HbA1C: No results for input(s): HGBA1C in the last 72 hours. CBG: Recent Labs  Lab 09/01/19 1159 09/01/19 1608 09/01/19 2058 09/02/19 0840 09/02/19 1233  GLUCAP 213* 224* 153* 147* 251*   Lipid Profile: No results for input(s): CHOL, HDL, LDLCALC, TRIG, CHOLHDL, LDLDIRECT in the last 72 hours. Thyroid Function Tests: No results for input(s): TSH, T4TOTAL, FREET4, T3FREE, THYROIDAB in the last 72 hours. Anemia Panel: No results for input(s): VITAMINB12, FOLATE, FERRITIN, TIBC, IRON, RETICCTPCT in  the last 72 hours. Sepsis Labs: No results for input(s): PROCALCITON, LATICACIDVEN in the last 168 hours.  Recent Results (from the past 240 hour(s))  SARS CORONAVIRUS 2 (TAT 6-24 HRS) Nasopharyngeal Nasopharyngeal Swab     Status: Abnormal   Collection Time: 08/27/19  2:19 PM   Specimen: Nasopharyngeal Swab  Result Value Ref Range Status   SARS Coronavirus 2 POSITIVE (A) NEGATIVE Final    Comment: RESULT CALLED TO, READ BACK BY AND VERIFIED WITH: Junious Silk RN 9:30 08/28/19 ([email or web address]) (NOTE) SARS-CoV-2 target nucleic acids are DETECTED. The SARS-CoV-2 RNA is generally detectable in upper and lower respiratory specimens during the acute phase of infection. Positive results are indicative of the presence of SARS-CoV-2 RNA. Clinical correlation with patient history and other diagnostic information is  necessary to determine patient infection status. Positive results do not rule out bacterial infection or co-infection with other viruses.  The expected result is Negative. Fact Sheet for Patients: SugarRoll.be Fact Sheet for Healthcare Providers: https://www.woods-mathews.com/ This test is not yet approved or cleared by the  [place] Architectural technologist and  has been authorized for detection and/or diagnosis of SARS-CoV-2 by FDA under an Print production planner (EUA). This EUA will remain  in effect (meaning this test can be used) for th e duration of the COVID-19 declaration under Section 564(b)(1) of the Act, 21 U.S.C. section 360bbb-3(b)(1), unless the authorization is terminated or revoked sooner. Performed at [facility], [place] [address] [place], [place] [address]          Radiology Studies: No results found.      Scheduled Meds:  Continuous Infusions:   LOS: 6 days    Enzo Bi, MD [facility] If 7PM-7AM, please contact night-coverage 09/05/2019, 6:15 PM

## 2019-09-08 ENCOUNTER — Other Ambulatory Visit: Payer: Self-pay

## 2019-09-08 ENCOUNTER — Encounter (HOSPITAL_COMMUNITY)
Admit: 2019-09-08 | Discharge: 2019-09-08 | Disposition: A | Discharge status: Home / Self Care | Payer: PPO | Source: Ambulatory Visit | Attending: Internal Medicine | Admitting: Internal Medicine

## 2019-09-08 DIAGNOSIS — Z299 Encounter for prophylactic measures, unspecified: Secondary | ICD-10-CM | POA: Diagnosis not present

## 2019-09-08 DIAGNOSIS — K7290 Hepatic failure, unspecified without coma: Secondary | ICD-10-CM | POA: Diagnosis not present

## 2019-09-08 DIAGNOSIS — D649 Anemia, unspecified: Secondary | ICD-10-CM | POA: Insufficient documentation

## 2019-09-08 DIAGNOSIS — U071 COVID-19: Secondary | ICD-10-CM | POA: Diagnosis not present

## 2019-09-08 DIAGNOSIS — K7581 Nonalcoholic steatohepatitis (NASH): Secondary | ICD-10-CM | POA: Insufficient documentation

## 2019-09-08 DIAGNOSIS — E1165 Type 2 diabetes mellitus with hyperglycemia: Secondary | ICD-10-CM | POA: Diagnosis not present

## 2019-09-08 DIAGNOSIS — Z6834 Body mass index (BMI) 34.0-34.9, adult: Secondary | ICD-10-CM | POA: Diagnosis not present

## 2019-09-08 DIAGNOSIS — I8500 Esophageal varices without bleeding: Secondary | ICD-10-CM | POA: Diagnosis not present

## 2019-09-08 LAB — SAMPLE TO BLOOD BANK

## 2019-09-08 LAB — HEMOGLOBIN AND HEMATOCRIT, BLOOD
HCT: 25 % — ABNORMAL LOW (ref 36.0–46.0)
Hemoglobin: 6.9 g/dL — CL (ref 12.0–15.0)

## 2019-09-08 LAB — PREPARE RBC (CROSSMATCH)

## 2019-09-08 MED ORDER — HEPARIN SOD (PORK) LOCK FLUSH 100 UNIT/ML IV SOLN
500.0000 [IU] | Freq: Once | INTRAVENOUS | Status: AC
  Administered 2019-09-08: 500 [IU] via INTRAVENOUS

## 2019-09-09 ENCOUNTER — Other Ambulatory Visit: Payer: Self-pay

## 2019-09-09 ENCOUNTER — Ambulatory Visit (INDEPENDENT_AMBULATORY_CARE_PROVIDER_SITE_OTHER): Payer: PPO | Admitting: Internal Medicine

## 2019-09-09 ENCOUNTER — Encounter (INDEPENDENT_AMBULATORY_CARE_PROVIDER_SITE_OTHER): Payer: Self-pay | Admitting: Internal Medicine

## 2019-09-09 VITALS — Wt 184.0 lb

## 2019-09-09 DIAGNOSIS — D649 Anemia, unspecified: Secondary | ICD-10-CM | POA: Diagnosis not present

## 2019-09-09 DIAGNOSIS — I8500 Esophageal varices without bleeding: Secondary | ICD-10-CM | POA: Diagnosis not present

## 2019-09-09 DIAGNOSIS — F419 Anxiety disorder, unspecified: Secondary | ICD-10-CM | POA: Diagnosis not present

## 2019-09-09 DIAGNOSIS — K7460 Unspecified cirrhosis of liver: Secondary | ICD-10-CM | POA: Diagnosis not present

## 2019-09-09 DIAGNOSIS — K7682 Hepatic encephalopathy: Secondary | ICD-10-CM

## 2019-09-09 DIAGNOSIS — K7469 Other cirrhosis of liver: Secondary | ICD-10-CM | POA: Diagnosis not present

## 2019-09-09 DIAGNOSIS — K7290 Hepatic failure, unspecified without coma: Secondary | ICD-10-CM | POA: Diagnosis not present

## 2019-09-09 DIAGNOSIS — U071 COVID-19: Secondary | ICD-10-CM | POA: Diagnosis not present

## 2019-09-09 DIAGNOSIS — K31819 Angiodysplasia of stomach and duodenum without bleeding: Secondary | ICD-10-CM

## 2019-09-09 DIAGNOSIS — J9611 Chronic respiratory failure with hypoxia: Secondary | ICD-10-CM | POA: Diagnosis not present

## 2019-09-09 DIAGNOSIS — E876 Hypokalemia: Secondary | ICD-10-CM | POA: Diagnosis not present

## 2019-09-09 DIAGNOSIS — K219 Gastro-esophageal reflux disease without esophagitis: Secondary | ICD-10-CM

## 2019-09-09 DIAGNOSIS — E039 Hypothyroidism, unspecified: Secondary | ICD-10-CM | POA: Diagnosis not present

## 2019-09-09 DIAGNOSIS — F329 Major depressive disorder, single episode, unspecified: Secondary | ICD-10-CM | POA: Diagnosis not present

## 2019-09-09 DIAGNOSIS — E119 Type 2 diabetes mellitus without complications: Secondary | ICD-10-CM | POA: Diagnosis not present

## 2019-09-09 DIAGNOSIS — K7581 Nonalcoholic steatohepatitis (NASH): Secondary | ICD-10-CM | POA: Diagnosis not present

## 2019-09-09 NOTE — Progress Notes (Signed)
Virtual Visit via Telephone Note  Plans were for patient to come for face-to-face office visit following recent hospitalization but since she tested positive for Covid, we decided to proceed with virtual/telephone visit.  Patient and her husband were agreeable. I connected with Jerilee Field on 09/09/19 at 10:15 AM EST by telephone and verified that I am speaking with the correct person using two identifiers. Both the patient and her husband participated in this visit.  Location: Patient: home Provider: office   I discussed the limitations, risks, security and privacy concerns of performing an evaluation and management service by telephone and the availability of in person appointments. I also discussed with the patient that there may be a patient responsible charge related to this service. The patient expressed understanding and agreed to proceed.   History of Present Illness:  Patient is 63 year old Caucasian female with decompensated cirrhosis secondary to NASH.  She started with esophageal variceal bleed in November 2011 controlled with esophageal variceal banding.  She second episode of esophageal variceal bleed in November 2013 and none thereafter.  Over the last few years she has developed gastric antral vascular ectasia and has not responded to [facility] ablation both here and at [facility].  He also has undergone intervention for mesenteric varices.  She also had TIPS in October 2019 at [facility].  She has undergone transplant evaluation but not yet listed.  She had EGD with APC ablation at [facility] on 07/11/2019 and she is scheduled for another procedure later this month.  She is requiring transfusion every 1 to 2 weeks.  Her transplant hepatologist Dr. Reinaldo Meeker recommended iron infusion but she is allergic to ferumoxytol. Patient was admitted to [facility] on 08/27/2019 for hepatic encephalopathy.  She was felt to be dehydrated.  She was also constipated.  She was treated and  improved.  She was discharged on 09/02/2019.  Patient was advised to hold bumetanide and spironolactone. Patient had blood work yesterday and her hemoglobin was 6.9 g.  She is to receive 1 unit of PRBCs on 09/10/2019. Patient states she is feeling better.  She is not having any difficulty walking in the house.  She states she has about 10 stools per day but her husband Ronalee Belts states she is not having more than 5 or 6 stools per day.  Most of her stools are small volume.  She denies rectal bleeding.  Her stools have been dark intermittently.  She denies abdominal pain nausea or vomiting.  Her husband states she has a good appetite.  Since discharge she has gained about 8 pounds.  She has not been confused since she left the hospital.  She continues to feel weak.  She monitors her blood glucose levels.  This morning was 100 but at times it can be 200 mg. Patient says she will be tested for Covid on 09/15/2019 and undergo EGD on 09/16/2019 at [facility]. Patient's transplant coordinator Ms. Neysa Bonito was updated while she was hospitalized. Finally patient told me that she had chest pain lasting for couple of minutes last night with no shortness of breath and resolved spontaneously.  Patient will need to go to emergency room if she has any more episodes of chest pain.    Observations/Objective:  Patient reported her weight to be 184 pounds today. When she left the hospital on 09/02/2019 her weight was 178 pounds. She weighed 193 pounds on 04/07/2019. Patient speech was felt to be normal during conversation.   Assessment and  Plan:  #1.  Hepatic encephalopathy.  She is on lactulose and Xifaxan and she is having multiple bowel movements per day.  She appears to be at her baseline. It is very important for patient to have at least 4 bowel movements per day.  #2.  Anemia secondary to chronic GI bleed secondary to gastric antral vascular ectasia not responding to endoscopic therapy.  She also has  undergone given capsule study at [facility](October 2018) and at [facility](September 2020) without significant findings in small bowel she only had single ileal AV malformation on letter study without bleeding.  Her hemoglobin is low again and she will receive a unit of PRBCs tomorrow.  I feel her chronic GI bleed remains biggest threat to her wellbeing.  #3.  Decompensated cirrhosis secondary to NASH.  She has not been listed because of low meld score.  Her meld score last week was around 17.  #4.  Fluid overload.  She was felt to be dehydrated at the time of recent hospitalization.  Diuretic therapy is on hold.  She will have metabolic 7 on 76/73/4193 at which time we will determine if she should resume diuretic therapy.   Follow Up Instructions:  Patient's medication list was updated. She is advised to take Protonix 40 mg by mouth daily before breakfast.  She does not need twice daily dose. Patient to be given monitor PRBCs tomorrow. Patient will have metabolic 7 prior to transfusion tomorrow morning. Patient will continue to check her weight every morning. Office visit in 4 weeks. I discussed the assessment and treatment plan with the patient. The patient was provided an opportunity to ask questions and all were answered. The patient agreed with the plan and demonstrated an understanding of the instructions.   The patient was advised to call back or seek an in-person evaluation if the symptoms worsen or if the condition fails to improve as anticipated.  I provided 21 minutes of non-face-to-face time during this encounter.   Hildred Laser, MD

## 2019-09-09 NOTE — Patient Instructions (Signed)
Patient will have metabolic 7 at the time of blood transfusion tomorrow. Will address diuretic therapy once metabolic 7 completed and reviewed.

## 2019-09-10 ENCOUNTER — Other Ambulatory Visit: Payer: Self-pay

## 2019-09-10 ENCOUNTER — Encounter (HOSPITAL_COMMUNITY)
Admission: RE | Admit: 2019-09-10 | Discharge: 2019-09-10 | Disposition: A | Discharge status: Home / Self Care | Payer: PPO | Source: Ambulatory Visit | Attending: Internal Medicine | Admitting: Internal Medicine

## 2019-09-10 ENCOUNTER — Other Ambulatory Visit (INDEPENDENT_AMBULATORY_CARE_PROVIDER_SITE_OTHER): Payer: Self-pay | Admitting: Internal Medicine

## 2019-09-10 DIAGNOSIS — D649 Anemia, unspecified: Secondary | ICD-10-CM | POA: Diagnosis not present

## 2019-09-10 LAB — BASIC METABOLIC PANEL
Anion gap: 4 — ABNORMAL LOW (ref 5–15)
BUN: 16 mg/dL (ref 8–23)
CO2: 17 mmol/L — ABNORMAL LOW (ref 22–32)
Calcium: 7.5 mg/dL — ABNORMAL LOW (ref 8.9–10.3)
Chloride: 113 mmol/L — ABNORMAL HIGH (ref 98–111)
Creatinine, Ser: 0.9 mg/dL (ref 0.44–1.00)
GFR calc Af Amer: >60 mL/min (ref >60)
GFR calc non Af Amer: >60 mL/min (ref >60)
Glucose, Bld: 204 mg/dL — ABNORMAL HIGH (ref 70–99)
Potassium: 4.7 mmol/L (ref 3.5–5.1)
Sodium: 134 mmol/L — ABNORMAL LOW (ref 135–145)

## 2019-09-10 MED ORDER — SODIUM CHLORIDE 0.9% IV SOLUTION: Freq: Once | INTRAVENOUS | Status: AC

## 2019-09-10 MED ORDER — DIPHENHYDRAMINE HCL 25 MG PO CAPS
25.0000 mg | ORAL_CAPSULE | Freq: Once | ORAL | Status: AC
  Administered 2019-09-10: 09:00:00 25 mg via ORAL

## 2019-09-10 MED ORDER — HEPARIN SOD (PORK) LOCK FLUSH 100 UNIT/ML IV SOLN: 500.0000 [IU] | INTRAVENOUS | Status: DC | PRN

## 2019-09-10 MED ORDER — ACETAMINOPHEN 325 MG PO TABS
650.0000 mg | ORAL_TABLET | Freq: Once | ORAL | Status: AC
  Administered 2019-09-10: 650 mg via ORAL

## 2019-09-11 LAB — TYPE AND SCREEN
ABO/RH(D): O NEG
Antibody Screen: NEGATIVE
Unit division: 0
Unit division: 0

## 2019-09-11 LAB — BPAM RBC
Blood Product Expiration Date: 202012232359
Blood Product Expiration Date: 202101092359
ISSUE DATE / TIME: 202012230923
ISSUE DATE / TIME: 202012230927
Unit Type and Rh: 9500
Unit Type and Rh: 9500

## 2019-09-11 NOTE — Unmapped (Signed)
Patient's husband paged on call coordinator to clear up confusion surrounding their appointments on Monday.      His view in Mychart has changed and he doesn't understand.  I reviewed both appointments with him for next week including COVID screening on Monday 12/28 and GI procedure the next day.    He verbalized understanding and they will proceed with appointments next week.

## 2019-09-15 ENCOUNTER — Encounter (HOSPITAL_COMMUNITY)
Admit: 2019-09-15 | Discharge: 2019-09-15 | Disposition: A | Discharge status: Home / Self Care | Payer: PPO | Attending: Internal Medicine | Admitting: Internal Medicine

## 2019-09-15 ENCOUNTER — Encounter
Admit: 2019-09-15 | Discharge: 2019-09-15 | Payer: PRIVATE HEALTH INSURANCE | Attending: Internal Medicine | Primary: Internal Medicine

## 2019-09-15 ENCOUNTER — Ambulatory Visit: Admit: 2019-09-15 | Discharge: 2019-09-16 | Payer: PRIVATE HEALTH INSURANCE | Attending: Family | Primary: Family

## 2019-09-15 DIAGNOSIS — Z01818 Encounter for other preprocedural examination: Principal | ICD-10-CM

## 2019-09-15 DIAGNOSIS — D649 Anemia, unspecified: Secondary | ICD-10-CM | POA: Diagnosis not present

## 2019-09-15 LAB — HEMOGLOBIN AND HEMATOCRIT, BLOOD
HCT: 26.3 % — ABNORMAL LOW (ref 36.0–46.0)
Hemoglobin: 7.6 g/dL — ABNORMAL LOW (ref 12.0–15.0)

## 2019-09-15 LAB — SAMPLE TO BLOOD BANK

## 2019-09-15 MED ORDER — HEPARIN SOD (PORK) LOCK FLUSH 100 UNIT/ML IV SOLN
500.0000 [IU] | INTRAVENOUS | Status: AC | PRN
  Administered 2019-09-15: 500 [IU]

## 2019-09-15 NOTE — Unmapped (Signed)
Name:  Ellianah Cordy  DOB: 09-25-1955  Today's Date: 09/15/2019  Age:  63 y.o.    Today's Visit Included:   Indication for Testing: Patient is scheduled for an upcoming procedure, surgery or treatment requiring pre-testing for COVID-19.    Informed the patient that their surgeon will be notified of results in 24-48 hours.     Advised patient to self-isolate and remain in his/her home until the surgery to minimize risk of COVID-19 infection prior to surgery.    Also advised patient to notify his/her surgeon/specialist immediately if the patient develops any new symptoms such as:  []  Subjective fever  []  Chills  []  Muscle aches  []  Runny nose  []  Sore throat  []  Loss of taste or smell   []  Cough (new or worsening of chronic cough)  []  Shortness of breath  []  Diarrhea (3 or more loose stools in the last 24 hours)  []  Fatigue (new or worsening)  []  Abdominal pain  []  Nausea or vomiting  []  Headache     Advised all household members and close contacts to remain in the house as much as possible as well.     COVID-19 testing performed.    Heloise Beecham, CMA

## 2019-09-15 NOTE — Unmapped (Signed)
Addended by: Westley Foots I on: 09/15/2019 09:48 AM     Modules accepted: Orders

## 2019-09-16 ENCOUNTER — Ambulatory Visit: Admit: 2019-09-16 | Discharge: 2019-09-16 | Payer: PRIVATE HEALTH INSURANCE

## 2019-09-16 ENCOUNTER — Encounter: Admit: 2019-09-16 | Discharge: 2019-09-16 | Payer: PRIVATE HEALTH INSURANCE

## 2019-09-16 DIAGNOSIS — K3189 Other diseases of stomach and duodenum: Secondary | ICD-10-CM | POA: Diagnosis not present

## 2019-09-16 DIAGNOSIS — E119 Type 2 diabetes mellitus without complications: Secondary | ICD-10-CM | POA: Diagnosis not present

## 2019-09-16 DIAGNOSIS — D649 Anemia, unspecified: Secondary | ICD-10-CM | POA: Diagnosis not present

## 2019-09-16 DIAGNOSIS — K31811 Angiodysplasia of stomach and duodenum with bleeding: Secondary | ICD-10-CM | POA: Diagnosis not present

## 2019-09-16 DIAGNOSIS — Z9049 Acquired absence of other specified parts of digestive tract: Secondary | ICD-10-CM | POA: Diagnosis not present

## 2019-09-16 DIAGNOSIS — Z885 Allergy status to narcotic agent status: Secondary | ICD-10-CM | POA: Diagnosis not present

## 2019-09-16 DIAGNOSIS — K766 Portal hypertension: Secondary | ICD-10-CM | POA: Diagnosis not present

## 2019-09-16 DIAGNOSIS — E039 Hypothyroidism, unspecified: Secondary | ICD-10-CM | POA: Diagnosis not present

## 2019-09-16 MED ADMIN — ondansetron (ZOFRAN) injection: INTRAVENOUS | @ 19:00:00 | Stop: 2019-09-16

## 2019-09-16 MED ADMIN — phenylephrine HCl in 0.9% NaCl 0.8 mg/10 mL (80 mcg/mL) injection Syrg: INTRAVENOUS | @ 20:00:00 | Stop: 2019-09-16

## 2019-09-16 MED ADMIN — heparin, porcine (PF) 100 unit/mL injection 300 Units: 300 [IU] | INTRAVENOUS | @ 22:00:00 | Stop: 2019-09-16

## 2019-09-16 MED ADMIN — succinylcholine (ANECTINE) injection: INTRAVENOUS | @ 19:00:00 | Stop: 2019-09-16

## 2019-09-16 MED ADMIN — fentaNYL (PF) (SUBLIMAZE) injection: INTRAVENOUS | @ 20:00:00 | Stop: 2019-09-16

## 2019-09-16 MED ADMIN — lactated Ringers infusion: INTRAVENOUS | @ 19:00:00 | Stop: 2019-09-16

## 2019-09-16 MED ADMIN — lactated Ringers infusion: 10 mL/h | INTRAVENOUS | @ 19:00:00 | Stop: 2019-09-16

## 2019-09-16 MED ADMIN — propofoL (DIPRIVAN) injection: INTRAVENOUS | @ 19:00:00 | Stop: 2019-09-16

## 2019-09-16 MED ADMIN — dextrose 50 % in water (D50W) 50 % solution 25 mL: 25 mL | INTRAVENOUS | @ 19:00:00 | Stop: 2019-09-16

## 2019-09-16 MED ADMIN — propofoL (DIPRIVAN) injection: INTRAVENOUS | @ 20:00:00 | Stop: 2019-09-16

## 2019-09-16 MED ADMIN — phenylephrine HCl in 0.9% NaCl 0.8 mg/10 mL (80 mcg/mL) injection Syrg: INTRAVENOUS | @ 19:00:00 | Stop: 2019-09-16

## 2019-09-16 MED ADMIN — lidocaine (XYLOCAINE) 20 mg/mL (2 %) injection: INTRAVENOUS | @ 19:00:00 | Stop: 2019-09-16

## 2019-09-16 MED ADMIN — fentaNYL (PF) (SUBLIMAZE) injection: INTRAVENOUS | @ 19:00:00 | Stop: 2019-09-16

## 2019-09-22 ENCOUNTER — Encounter (HOSPITAL_COMMUNITY)
Admission: RE | Admit: 2019-09-22 | Discharge: 2019-09-22 | Disposition: A | Discharge status: Home / Self Care | Payer: PPO | Source: Ambulatory Visit | Attending: Internal Medicine | Admitting: Internal Medicine

## 2019-09-22 ENCOUNTER — Other Ambulatory Visit: Payer: Self-pay

## 2019-09-22 DIAGNOSIS — D649 Anemia, unspecified: Secondary | ICD-10-CM | POA: Insufficient documentation

## 2019-09-22 DIAGNOSIS — K7581 Nonalcoholic steatohepatitis (NASH): Secondary | ICD-10-CM | POA: Insufficient documentation

## 2019-09-22 LAB — COMPREHENSIVE METABOLIC PANEL
ALT: 28 U/L (ref 0–44)
AST: 32 U/L (ref 15–41)
Albumin: 2.3 g/dL — ABNORMAL LOW (ref 3.5–5.0)
Alkaline Phosphatase: 91 U/L (ref 38–126)
Anion gap: 7 (ref 5–15)
BUN: 16 mg/dL (ref 8–23)
CO2: 22 mmol/L (ref 22–32)
Calcium: 8 mg/dL — ABNORMAL LOW (ref 8.9–10.3)
Chloride: 110 mmol/L (ref 98–111)
Creatinine, Ser: 0.91 mg/dL (ref 0.44–1.00)
GFR calc Af Amer: >60 mL/min (ref >60)
GFR calc non Af Amer: >60 mL/min (ref >60)
Glucose, Bld: 185 mg/dL — ABNORMAL HIGH (ref 70–99)
Potassium: 3.1 mmol/L — ABNORMAL LOW (ref 3.5–5.1)
Sodium: 139 mmol/L (ref 135–145)
Total Bilirubin: 2.7 mg/dL — ABNORMAL HIGH (ref 0.3–1.2)
Total Protein: 5 g/dL — ABNORMAL LOW (ref 6.5–8.1)

## 2019-09-22 LAB — CBC WITH DIFFERENTIAL/PLATELET
Abs Immature Granulocytes: 0 10*3/uL (ref 0.00–0.07)
Basophils Absolute: 0 10*3/uL (ref 0.0–0.1)
Basophils Relative: 1 %
Eosinophils Absolute: 0.1 10*3/uL (ref 0.0–0.5)
Eosinophils Relative: 2 %
HCT: 26.5 % — ABNORMAL LOW (ref 36.0–46.0)
Hemoglobin: 7.5 g/dL — ABNORMAL LOW (ref 12.0–15.0)
Immature Granulocytes: 0 %
Lymphocytes Relative: 24 %
Lymphs Abs: 0.8 10*3/uL (ref 0.7–4.0)
MCH: 24.9 pg — ABNORMAL LOW (ref 26.0–34.0)
MCHC: 28.3 g/dL — ABNORMAL LOW (ref 30.0–36.0)
MCV: 88 fL (ref 80.0–100.0)
Monocytes Absolute: 0.5 10*3/uL (ref 0.1–1.0)
Monocytes Relative: 13 %
Neutro Abs: 2 10*3/uL (ref 1.7–7.7)
Neutrophils Relative %: 60 %
Platelets: 50 10*3/uL — ABNORMAL LOW (ref 150–400)
RBC: 3.01 MIL/uL — ABNORMAL LOW (ref 3.87–5.11)
RDW: 21.5 % — ABNORMAL HIGH (ref 11.5–15.5)
WBC: 3.4 10*3/uL — ABNORMAL LOW (ref 4.0–10.5)
nRBC: 0 % (ref 0.0–0.2)

## 2019-09-22 LAB — SAMPLE TO BLOOD BANK

## 2019-09-22 MED ORDER — HEPARIN SOD (PORK) LOCK FLUSH 100 UNIT/ML IV SOLN
500.0000 [IU] | Freq: Once | INTRAVENOUS | Status: AC
  Administered 2019-09-22: 500 [IU] via INTRAVENOUS

## 2019-09-23 ENCOUNTER — Telehealth (INDEPENDENT_AMBULATORY_CARE_PROVIDER_SITE_OTHER): Payer: Self-pay | Admitting: Internal Medicine

## 2019-09-23 ENCOUNTER — Other Ambulatory Visit: Payer: Self-pay

## 2019-09-23 ENCOUNTER — Other Ambulatory Visit (INDEPENDENT_AMBULATORY_CARE_PROVIDER_SITE_OTHER): Payer: Self-pay | Admitting: Internal Medicine

## 2019-09-23 ENCOUNTER — Encounter (INDEPENDENT_AMBULATORY_CARE_PROVIDER_SITE_OTHER): Payer: PPO | Admitting: Ophthalmology

## 2019-09-23 DIAGNOSIS — E11311 Type 2 diabetes mellitus with unspecified diabetic retinopathy with macular edema: Secondary | ICD-10-CM

## 2019-09-23 DIAGNOSIS — H35033 Hypertensive retinopathy, bilateral: Secondary | ICD-10-CM

## 2019-09-23 DIAGNOSIS — H43813 Vitreous degeneration, bilateral: Secondary | ICD-10-CM | POA: Diagnosis not present

## 2019-09-23 DIAGNOSIS — E113312 Type 2 diabetes mellitus with moderate nonproliferative diabetic retinopathy with macular edema, left eye: Secondary | ICD-10-CM

## 2019-09-23 DIAGNOSIS — I10 Essential (primary) hypertension: Secondary | ICD-10-CM | POA: Diagnosis not present

## 2019-09-23 DIAGNOSIS — E876 Hypokalemia: Secondary | ICD-10-CM

## 2019-09-23 DIAGNOSIS — E113391 Type 2 diabetes mellitus with moderate nonproliferative diabetic retinopathy without macular edema, right eye: Secondary | ICD-10-CM | POA: Diagnosis not present

## 2019-09-23 NOTE — Telephone Encounter (Signed)
Patient was called couple times but no answer. Also reviewed her blood work and left a message on her answering service. No significant change in her weight since she was discharged from the hospital.

## 2019-09-23 NOTE — Telephone Encounter (Signed)
Patient called wanted to let [name] know what her weight has been on 1-4 it was 184 lbs and on 1-5 it is 187 lbs and also her stools are watery

## 2019-09-29 ENCOUNTER — Other Ambulatory Visit: Payer: Self-pay

## 2019-09-29 ENCOUNTER — Encounter (INDEPENDENT_AMBULATORY_CARE_PROVIDER_SITE_OTHER): Payer: Self-pay | Admitting: *Deleted

## 2019-09-29 ENCOUNTER — Encounter (HOSPITAL_COMMUNITY): Payer: Self-pay | Admitting: Emergency Medicine

## 2019-09-29 ENCOUNTER — Encounter (HOSPITAL_COMMUNITY)
Admission: RE | Admit: 2019-09-29 | Discharge: 2019-09-29 | Disposition: A | Discharge status: Home / Self Care | Payer: PPO | Source: Ambulatory Visit | Attending: Internal Medicine | Admitting: Internal Medicine

## 2019-09-29 ENCOUNTER — Inpatient Hospital Stay (HOSPITAL_COMMUNITY)
Admission: EM | Admit: 2019-09-29 | Discharge: 2019-10-05 | DRG: 377 | Disposition: A | Discharge status: Home / Self Care | Payer: PPO | Source: Ambulatory Visit | Attending: Internal Medicine | Admitting: Internal Medicine

## 2019-09-29 ENCOUNTER — Telehealth (INDEPENDENT_AMBULATORY_CARE_PROVIDER_SITE_OTHER): Payer: Self-pay | Admitting: *Deleted

## 2019-09-29 DIAGNOSIS — K766 Portal hypertension: Secondary | ICD-10-CM | POA: Diagnosis present

## 2019-09-29 DIAGNOSIS — R0600 Dyspnea, unspecified: Secondary | ICD-10-CM

## 2019-09-29 DIAGNOSIS — Z79899 Other long term (current) drug therapy: Secondary | ICD-10-CM

## 2019-09-29 DIAGNOSIS — E039 Hypothyroidism, unspecified: Secondary | ICD-10-CM | POA: Diagnosis present

## 2019-09-29 DIAGNOSIS — R161 Splenomegaly, not elsewhere classified: Secondary | ICD-10-CM | POA: Diagnosis present

## 2019-09-29 DIAGNOSIS — I50813 Acute on chronic right heart failure: Secondary | ICD-10-CM | POA: Diagnosis not present

## 2019-09-29 DIAGNOSIS — E871 Hypo-osmolality and hyponatremia: Secondary | ICD-10-CM | POA: Diagnosis not present

## 2019-09-29 DIAGNOSIS — E119 Type 2 diabetes mellitus without complications: Secondary | ICD-10-CM | POA: Diagnosis present

## 2019-09-29 DIAGNOSIS — Z95828 Presence of other vascular implants and grafts: Secondary | ICD-10-CM

## 2019-09-29 DIAGNOSIS — K449 Diaphragmatic hernia without obstruction or gangrene: Secondary | ICD-10-CM | POA: Diagnosis not present

## 2019-09-29 DIAGNOSIS — M810 Age-related osteoporosis without current pathological fracture: Secondary | ICD-10-CM | POA: Diagnosis present

## 2019-09-29 DIAGNOSIS — I5082 Biventricular heart failure: Secondary | ICD-10-CM | POA: Diagnosis present

## 2019-09-29 DIAGNOSIS — M797 Fibromyalgia: Secondary | ICD-10-CM | POA: Diagnosis not present

## 2019-09-29 DIAGNOSIS — R0602 Shortness of breath: Secondary | ICD-10-CM | POA: Diagnosis not present

## 2019-09-29 DIAGNOSIS — Z8616 Personal history of COVID-19: Secondary | ICD-10-CM

## 2019-09-29 DIAGNOSIS — E8809 Other disorders of plasma-protein metabolism, not elsewhere classified: Secondary | ICD-10-CM | POA: Diagnosis present

## 2019-09-29 DIAGNOSIS — D696 Thrombocytopenia, unspecified: Secondary | ICD-10-CM | POA: Diagnosis present

## 2019-09-29 DIAGNOSIS — D649 Anemia, unspecified: Secondary | ICD-10-CM | POA: Diagnosis not present

## 2019-09-29 DIAGNOSIS — Z794 Long term (current) use of insulin: Secondary | ICD-10-CM

## 2019-09-29 DIAGNOSIS — I8510 Secondary esophageal varices without bleeding: Secondary | ICD-10-CM | POA: Diagnosis present

## 2019-09-29 DIAGNOSIS — Z87442 Personal history of urinary calculi: Secondary | ICD-10-CM | POA: Diagnosis not present

## 2019-09-29 DIAGNOSIS — N179 Acute kidney failure, unspecified: Secondary | ICD-10-CM | POA: Diagnosis present

## 2019-09-29 DIAGNOSIS — K31819 Angiodysplasia of stomach and duodenum without bleeding: Secondary | ICD-10-CM | POA: Diagnosis present

## 2019-09-29 DIAGNOSIS — E876 Hypokalemia: Secondary | ICD-10-CM | POA: Diagnosis not present

## 2019-09-29 DIAGNOSIS — K3189 Other diseases of stomach and duodenum: Secondary | ICD-10-CM | POA: Diagnosis not present

## 2019-09-29 DIAGNOSIS — E7800 Pure hypercholesterolemia, unspecified: Secondary | ICD-10-CM | POA: Diagnosis present

## 2019-09-29 DIAGNOSIS — K31811 Angiodysplasia of stomach and duodenum with bleeding: Principal | ICD-10-CM | POA: Diagnosis present

## 2019-09-29 DIAGNOSIS — D500 Iron deficiency anemia secondary to blood loss (chronic): Secondary | ICD-10-CM | POA: Diagnosis present

## 2019-09-29 DIAGNOSIS — Z801 Family history of malignant neoplasm of trachea, bronchus and lung: Secondary | ICD-10-CM | POA: Diagnosis not present

## 2019-09-29 DIAGNOSIS — K7290 Hepatic failure, unspecified without coma: Secondary | ICD-10-CM | POA: Diagnosis not present

## 2019-09-29 DIAGNOSIS — Z90710 Acquired absence of both cervix and uterus: Secondary | ICD-10-CM | POA: Diagnosis not present

## 2019-09-29 DIAGNOSIS — D62 Acute posthemorrhagic anemia: Secondary | ICD-10-CM | POA: Diagnosis present

## 2019-09-29 DIAGNOSIS — K7460 Unspecified cirrhosis of liver: Secondary | ICD-10-CM | POA: Diagnosis present

## 2019-09-29 DIAGNOSIS — Z8249 Family history of ischemic heart disease and other diseases of the circulatory system: Secondary | ICD-10-CM

## 2019-09-29 DIAGNOSIS — R4182 Altered mental status, unspecified: Secondary | ICD-10-CM | POA: Diagnosis not present

## 2019-09-29 DIAGNOSIS — K921 Melena: Secondary | ICD-10-CM | POA: Diagnosis not present

## 2019-09-29 DIAGNOSIS — K7581 Nonalcoholic steatohepatitis (NASH): Secondary | ICD-10-CM | POA: Diagnosis present

## 2019-09-29 DIAGNOSIS — R5383 Other fatigue: Secondary | ICD-10-CM | POA: Diagnosis not present

## 2019-09-29 DIAGNOSIS — I110 Hypertensive heart disease with heart failure: Secondary | ICD-10-CM | POA: Diagnosis present

## 2019-09-29 DIAGNOSIS — R531 Weakness: Secondary | ICD-10-CM | POA: Diagnosis not present

## 2019-09-29 DIAGNOSIS — E860 Dehydration: Secondary | ICD-10-CM | POA: Diagnosis not present

## 2019-09-29 DIAGNOSIS — E1169 Type 2 diabetes mellitus with other specified complication: Secondary | ICD-10-CM | POA: Diagnosis not present

## 2019-09-29 DIAGNOSIS — D6959 Other secondary thrombocytopenia: Secondary | ICD-10-CM | POA: Diagnosis present

## 2019-09-29 DIAGNOSIS — Z888 Allergy status to other drugs, medicaments and biological substances status: Secondary | ICD-10-CM | POA: Diagnosis not present

## 2019-09-29 DIAGNOSIS — Z833 Family history of diabetes mellitus: Secondary | ICD-10-CM | POA: Diagnosis not present

## 2019-09-29 DIAGNOSIS — F419 Anxiety disorder, unspecified: Secondary | ICD-10-CM | POA: Diagnosis not present

## 2019-09-29 DIAGNOSIS — K5520 Angiodysplasia of colon without hemorrhage: Secondary | ICD-10-CM | POA: Diagnosis not present

## 2019-09-29 DIAGNOSIS — D61818 Other pancytopenia: Secondary | ICD-10-CM | POA: Diagnosis not present

## 2019-09-29 DIAGNOSIS — Z79890 Hormone replacement therapy (postmenopausal): Secondary | ICD-10-CM

## 2019-09-29 DIAGNOSIS — E785 Hyperlipidemia, unspecified: Secondary | ICD-10-CM | POA: Diagnosis not present

## 2019-09-29 DIAGNOSIS — F329 Major depressive disorder, single episode, unspecified: Secondary | ICD-10-CM | POA: Diagnosis present

## 2019-09-29 DIAGNOSIS — I5033 Acute on chronic diastolic (congestive) heart failure: Secondary | ICD-10-CM | POA: Diagnosis present

## 2019-09-29 DIAGNOSIS — I50810 Right heart failure, unspecified: Secondary | ICD-10-CM | POA: Diagnosis not present

## 2019-09-29 DIAGNOSIS — K7469 Other cirrhosis of liver: Secondary | ICD-10-CM | POA: Diagnosis not present

## 2019-09-29 DIAGNOSIS — Z800 Family history of malignant neoplasm of digestive organs: Secondary | ICD-10-CM | POA: Diagnosis not present

## 2019-09-29 DIAGNOSIS — E8770 Fluid overload, unspecified: Secondary | ICD-10-CM | POA: Diagnosis not present

## 2019-09-29 DIAGNOSIS — Z20822 Contact with and (suspected) exposure to covid-19: Secondary | ICD-10-CM | POA: Diagnosis not present

## 2019-09-29 LAB — CBC
HCT: 21.5 % — ABNORMAL LOW (ref 36.0–46.0)
Hemoglobin: 6 g/dL — CL (ref 12.0–15.0)
MCH: 24.5 pg — ABNORMAL LOW (ref 26.0–34.0)
MCHC: 27.9 g/dL — ABNORMAL LOW (ref 30.0–36.0)
MCV: 87.8 fL (ref 80.0–100.0)
Platelets: 41 10*3/uL — ABNORMAL LOW (ref 150–400)
RBC: 2.45 MIL/uL — ABNORMAL LOW (ref 3.87–5.11)
RDW: 21.8 % — ABNORMAL HIGH (ref 11.5–15.5)
WBC: 2.2 10*3/uL — ABNORMAL LOW (ref 4.0–10.5)
nRBC: 0 % (ref 0.0–0.2)

## 2019-09-29 LAB — CBG MONITORING, ED: Glucose-Capillary: 120 mg/dL — ABNORMAL HIGH (ref 70–99)

## 2019-09-29 LAB — COMPREHENSIVE METABOLIC PANEL
ALT: 29 U/L (ref 0–44)
AST: 33 U/L (ref 15–41)
Albumin: 2.2 g/dL — ABNORMAL LOW (ref 3.5–5.0)
Alkaline Phosphatase: 90 U/L (ref 38–126)
Anion gap: 5 (ref 5–15)
BUN: 17 mg/dL (ref 8–23)
CO2: 23 mmol/L (ref 22–32)
Calcium: 7.8 mg/dL — ABNORMAL LOW (ref 8.9–10.3)
Chloride: 112 mmol/L — ABNORMAL HIGH (ref 98–111)
Creatinine, Ser: 0.91 mg/dL (ref 0.44–1.00)
GFR calc Af Amer: >60 mL/min (ref >60)
GFR calc non Af Amer: >60 mL/min (ref >60)
Glucose, Bld: 190 mg/dL — ABNORMAL HIGH (ref 70–99)
Potassium: 3.2 mmol/L — ABNORMAL LOW (ref 3.5–5.1)
Sodium: 140 mmol/L (ref 135–145)
Total Bilirubin: 2.3 mg/dL — ABNORMAL HIGH (ref 0.3–1.2)
Total Protein: 4.8 g/dL — ABNORMAL LOW (ref 6.5–8.1)

## 2019-09-29 LAB — BRAIN NATRIURETIC PEPTIDE: B Natriuretic Peptide: 40 pg/mL (ref 0.0–100.0)

## 2019-09-29 LAB — SAMPLE TO BLOOD BANK

## 2019-09-29 LAB — HEMOGLOBIN AND HEMATOCRIT, BLOOD
HCT: 20.9 % — ABNORMAL LOW (ref 36.0–46.0)
Hemoglobin: 5.8 g/dL — CL (ref 12.0–15.0)

## 2019-09-29 LAB — MAGNESIUM: Magnesium: 2.2 mg/dL (ref 1.7–2.4)

## 2019-09-29 LAB — PROTIME-INR
INR: 1.7 — ABNORMAL HIGH (ref 0.8–1.2)
Prothrombin Time: 20.3 seconds — ABNORMAL HIGH (ref 11.4–15.2)

## 2019-09-29 LAB — PREPARE RBC (CROSSMATCH)

## 2019-09-29 MED ORDER — POTASSIUM CHLORIDE CRYS ER 20 MEQ PO TBCR
40.0000 meq | EXTENDED_RELEASE_TABLET | ORAL | Status: AC
  Administered 2019-09-30: 40 meq via ORAL
  Filled 2019-09-29: qty 2

## 2019-09-29 MED ORDER — INSULIN ASPART 100 UNIT/ML ~~LOC~~ SOLN
0.0000 [IU] | Freq: Every day | SUBCUTANEOUS | Status: DC
  Administered 2019-10-04: 2 [IU] via SUBCUTANEOUS

## 2019-09-29 MED ORDER — INSULIN ASPART 100 UNIT/ML ~~LOC~~ SOLN: 0.0000 [IU] | Freq: Three times a day (TID) | SUBCUTANEOUS | Status: DC

## 2019-09-29 MED ORDER — INSULIN ASPART 100 UNIT/ML ~~LOC~~ SOLN
0.0000 [IU] | Freq: Three times a day (TID) | SUBCUTANEOUS | Status: DC
  Administered 2019-09-30: 2 [IU] via SUBCUTANEOUS
  Administered 2019-09-30: 5 [IU] via SUBCUTANEOUS
  Administered 2019-10-01: 3 [IU] via SUBCUTANEOUS
  Administered 2019-10-01 (×2): 2 [IU] via SUBCUTANEOUS
  Administered 2019-10-02 (×2): 3 [IU] via SUBCUTANEOUS
  Administered 2019-10-02: 2 [IU] via SUBCUTANEOUS
  Administered 2019-10-03: 3 [IU] via SUBCUTANEOUS
  Administered 2019-10-03: 2 [IU] via SUBCUTANEOUS
  Administered 2019-10-03 – 2019-10-04 (×2): 3 [IU] via SUBCUTANEOUS
  Administered 2019-10-04: 5 [IU] via SUBCUTANEOUS
  Administered 2019-10-04: 2 [IU] via SUBCUTANEOUS
  Administered 2019-10-05: 5 [IU] via SUBCUTANEOUS
  Administered 2019-10-05: 3 [IU] via SUBCUTANEOUS

## 2019-09-29 MED ORDER — SODIUM CHLORIDE 0.9 % IV SOLN
10.0000 mL/h | Freq: Once | INTRAVENOUS | Status: AC
  Administered 2019-09-29: 10 mL/h via INTRAVENOUS

## 2019-09-29 MED ORDER — FUROSEMIDE 10 MG/ML IJ SOLN
40.0000 mg | Freq: Once | INTRAMUSCULAR | Status: AC
  Administered 2019-09-30: 40 mg via INTRAVENOUS
  Filled 2019-09-29: qty 4

## 2019-09-29 MED ORDER — INSULIN ASPART 100 UNIT/ML ~~LOC~~ SOLN: 0.0000 [IU] | Freq: Every day | SUBCUTANEOUS | Status: DC

## 2019-09-29 NOTE — ED Notes (Signed)
Pt with sallow color, appears ill   Reports her hgb is 5 and she has been sent here for infusion

## 2019-09-29 NOTE — ED Notes (Signed)
CRITICAL VALUE ALERT  Critical Value:  hgb 6.0  Date & Time Notied:  09/29/19, 1807  Provider Notified: Dr. Roderic Palau  Orders Received/Actions taken: no new orders

## 2019-09-29 NOTE — ED Provider Notes (Signed)
[facility] EMERGENCY DEPARTMENT Provider Note   CSN: [id number] Arrival date & time: 09/29/19  1547     History Chief Complaint  Patient presents with  . Anemia    Maria [name] is a 64 y.o. female.  Patient has a history of [name] and upper GI bleeds.  Patient is followed by GI and her hemoglobin has dropped to below 6 in the office.  She complains of weakness.  She is not having black stools  The history is provided by the patient. No language interpreter was used.  Anemia This is a recurrent problem. The current episode started 2 days ago. The problem occurs constantly. The problem has not changed since onset.Pertinent negatives include no chest pain, no abdominal pain and no headaches. Nothing aggravates the symptoms. The treatment provided no relief.       Past Medical History:  Diagnosis Date  . Anemia   . CHF (congestive heart failure) ([place])   . Cirrhosis ([place])   . Depression   . Diabetes mellitus   . Esophageal bleed, non-variceal   . Eye hemorrhage    Behind left eye  . Fibromyalgia   . Hypercholesteremia   . Hypertension   . Hypothyroidism   . NAFLD (nonalcoholic fatty liver disease)   . Osteoarthrosis   . Osteoporosis   . PONV (postoperative nausea and vomiting)   . UTI (urinary tract infection) 11/12 end of the month   Patient feels that she passed a kidney stone at that time    Patient Active Problem List   Diagnosis Date Noted  . Acute anemia 09/29/2019  . Hepatic encephalopathy ([place]) 08/27/2019  . Altered mental status 02/27/2019  . Bacteriuria 02/27/2019  . Acute on chronic anemia 02/24/2019  . Acute hepatic encephalopathy 07/26/2018  . Obesity, Class III, BMI 40-49.9 (morbid obesity) ([place])   . Melena   . Anemia 07/02/2018  . UGI bleed 11/09/2017  . Insulin dependent diabetes mellitus   . Absolute anemia 09/13/2017  . Idiopathic esophageal varices with bleeding ([place]) 09/13/2017  . Other cirrhosis of liver ([place]) 08/22/2017  . NAFLD  (nonalcoholic fatty liver disease) 08/14/2017  . Encephalopathy, hepatic ([place]) 08/14/2017  . Metabolic encephalopathy 72/05/4708  . Hyperammonemia ([place]) 07/18/2017  . Hypothyroidism 07/18/2017  . Acute on chronic right-sided congestive heart failure ([place]) 07/06/2017  . Pulmonary hypertension ([place]) 07/06/2017  . GAVE (gastric antral vascular ectasia) 07/06/2017  . GI bleeding 07/06/2017  . Symptomatic anemia 07/04/2017  . Hypokalemia 07/04/2017  . Chronic diastolic heart failure ([place]) 06/11/2017  . CAD (coronary artery disease) 06/11/2017  . Nocturnal hypoxemia 02/22/2017  . Idiopathic esophageal varices without bleeding ([place]) 01/31/2017  . Dyspnea and respiratory abnormalities 01/25/2017  . Esophageal varices in cirrhosis ([place]) 05/19/2016  . Hepatic cirrhosis ([place]) 05/19/2016  . Esophageal varices ([place]) 12/09/2012  . Dysphagia, unspecified(787.20) 12/09/2012  . Anxiety 08/26/2012  . Cirrhosis, nonalcoholic ([place]) 62/83/6629  . Diabetes mellitus ([place]) 10/09/2011  . History of esophageal varices 10/09/2011  . Iron deficiency anemia 10/09/2011  . GERD (gastroesophageal reflux disease) 10/09/2011  . Thrombocytopenia ([place]) 10/09/2011    Past Surgical History:  Procedure Laterality Date  . APPENDECTOMY  1980  . BILATERAL SALPINGOOPHORECTOMY    . CHOLECYSTECTOMY    . COLONOSCOPY  03/15/2011  . COLONOSCOPY N/A 03/24/2016   Procedure: COLONOSCOPY;  Surgeon: [name], MD;  [facility];  Service: Endoscopy;  Laterality: N/A;  855  . ESOPHAGEAL BANDING  04/24/2012   Procedure: ESOPHAGEAL BANDING;  Surgeon: Bernadene Person  Gloriann Loan, MD;  Location: [facility];  Service: Endoscopy;  Laterality: N/A;  . Esophageal BANDING  08/03/2012   [facility] in [place] , [place]  09/24/2012   Procedure: ESOPHAGEAL BANDING;  Surgeon: [name], MD;  Location: [facility];  Service: Endoscopy;  Laterality: N/A;  Banding x 3  . ESOPHAGEAL BANDING N/A 01/29/2013    Procedure: ESOPHAGEAL BANDING;  Surgeon: [name], MD;  [facility];  Service: Endoscopy;  Laterality: N/A;  . ESOPHAGEAL BANDING N/A 06/19/2014   Procedure: ESOPHAGEAL BANDING;  Surgeon: [name], MD;  [facility];  Service: Endoscopy;  Laterality: N/A;  . ESOPHAGEAL BANDING N/A 01/05/2016   Procedure: ESOPHAGEAL BANDING;  Surgeon: [name], MD;  [facility];  Service: Endoscopy;  Laterality: N/A;  . ESOPHAGEAL BANDING N/A 08/03/2016   Procedure: ESOPHAGEAL BANDING;  Surgeon: [name], MD;  [facility];  Service: Endoscopy;  Laterality: N/A;  . ESOPHAGEAL BANDING N/A 05/02/2017   Procedure: ESOPHAGEAL BANDING;  Surgeon: [name], MD;  [facility];  Service: Endoscopy;  Laterality: N/A;  . ESOPHAGOGASTRODUODENOSCOPY  04/24/2012   Procedure: ESOPHAGOGASTRODUODENOSCOPY (EGD);  Surgeon: [name], MD;  [facility];  Service: Endoscopy;  Laterality: N/A;  300  . ESOPHAGOGASTRODUODENOSCOPY N/A 01/29/2013   Procedure: ESOPHAGOGASTRODUODENOSCOPY (EGD);  Surgeon: [name], MD;  [facility];  Service: Endoscopy;  Laterality: N/A;  1200  . ESOPHAGOGASTRODUODENOSCOPY N/A 05/22/2013   Procedure: ESOPHAGOGASTRODUODENOSCOPY (EGD);  Surgeon: [name], MD;  [facility];  Service: Endoscopy;  Laterality: N/A;  1:55  . ESOPHAGOGASTRODUODENOSCOPY N/A 12/31/2013   Procedure: ESOPHAGOGASTRODUODENOSCOPY (EGD);  Surgeon: [name], MD;  [facility];  Service: Endoscopy;  Laterality: N/A;  1200  . ESOPHAGOGASTRODUODENOSCOPY N/A 06/19/2014   Procedure: ESOPHAGOGASTRODUODENOSCOPY (EGD);  Surgeon: [name], MD;  [facility];  Service: Endoscopy;  Laterality: N/A;  1055  . ESOPHAGOGASTRODUODENOSCOPY N/A 01/15/2015   Procedure: ESOPHAGOGASTRODUODENOSCOPY (EGD);  Surgeon: [name], MD;  [facility];  Service: Endoscopy;  Laterality:  N/A;  730 - moved to 9:45 - moved to 1250-Ann notified pt  . ESOPHAGOGASTRODUODENOSCOPY N/A 06/30/2015   Procedure: ESOPHAGOGASTRODUODENOSCOPY (EGD);  Surgeon: [name], MD;  [facility];  Service: Endoscopy;  Laterality: N/A;  1200  . ESOPHAGOGASTRODUODENOSCOPY N/A 01/05/2016   Procedure: ESOPHAGOGASTRODUODENOSCOPY (EGD);  Surgeon: [name], MD;  [facility];  Service: Endoscopy;  Laterality: N/A;  830  . ESOPHAGOGASTRODUODENOSCOPY N/A 08/03/2016   Procedure: ESOPHAGOGASTRODUODENOSCOPY (EGD);  Surgeon: [name], MD;  [facility];  Service: Endoscopy;  Laterality: N/A;  930  . ESOPHAGOGASTRODUODENOSCOPY N/A 05/02/2017   Procedure: ESOPHAGOGASTRODUODENOSCOPY (EGD);  Surgeon: [name], MD;  [facility];  Service: Endoscopy;  Laterality: N/A;  12:00  . ESOPHAGOGASTRODUODENOSCOPY N/A 08/17/2017   Procedure: ESOPHAGOGASTRODUODENOSCOPY (EGD);  Surgeon: [name], MD;  [facility];  Service: Endoscopy;  Laterality: N/A;  8:15  . ESOPHAGOGASTRODUODENOSCOPY N/A 09/12/2017   Procedure: ESOPHAGOGASTRODUODENOSCOPY (EGD);  Surgeon: [name], MD;  [facility];  Service: Endoscopy;  Laterality: N/A;  1040  . ESOPHAGOGASTRODUODENOSCOPY N/A 10/10/2017   Procedure: ESOPHAGOGASTRODUODENOSCOPY (EGD);  Surgeon: [name], MD;  [facility];  Service: Endoscopy;  Laterality: N/A;  225  . ESOPHAGOGASTRODUODENOSCOPY N/A 11/09/2017   Procedure: ESOPHAGOGASTRODUODENOSCOPY (EGD);  Surgeon: [name], MD;  [facility];  Service: Endoscopy;  Laterality: N/A;  . ESOPHAGOGASTRODUODENOSCOPY (EGD) WITH PROPOFOL  09/24/2012   Procedure: ESOPHAGOGASTRODUODENOSCOPY (EGD) WITH PROPOFOL;  Surgeon: [name], MD;  Location: [facility];  Service: Endoscopy;  Laterality: N/A;  GE junction at 36  . ESOPHAGOGASTRODUODENOSCOPY (EGD) WITH PROPOFOL N/A 07/06/2017   Procedure:  ESOPHAGOGASTRODUODENOSCOPY (EGD) WITH PROPOFOL;  Surgeon: [name], MD;  [facility];  Service: Endoscopy;  Laterality: N/A;  . ESOPHAGOGASTRODUODENOSCOPY W/ BANDING  08/2010  . GIVENS CAPSULE STUDY N/A 07/05/2017   Procedure: GIVENS CAPSULE STUDY;  Surgeon: [name], MD;  [facility];  Service: Endoscopy;  Laterality: N/A;  . HOT HEMOSTASIS N/A 08/17/2017   Procedure: HOT HEMOSTASIS (ARGON PLASMA COAGULATION/BICAP);  Surgeon: [name], MD;  [facility];  Service: Endoscopy;  Laterality: N/A;  . HOT HEMOSTASIS  09/12/2017   Procedure: HOT HEMOSTASIS (ARGON PLASMA COAGULATION/BICAP);  Surgeon: [name], MD;  [facility];  Service: Endoscopy;;  gastric  . HOT HEMOSTASIS  10/10/2017   Procedure: HOT HEMOSTASIS (ARGON PLASMA COAGULATION/BICAP);  Surgeon: [name], MD;  [facility];  Service: Endoscopy;;  . POLYPECTOMY  03/24/2016   Procedure: POLYPECTOMY;  Surgeon: [name], MD;  [facility];  Service: Endoscopy;;  sigmoid polyp  . PORTACATH PLACEMENT Right 06/02/2019   Procedure: INSERTION PORT-A-CATH;  Surgeon: Aviva Signs, MD;  Location: [facility];  Service: General;  Laterality: Right;  . RIGHT HEART CATH N/A 04/16/2017   Procedure: Right Heart Cath;  Surgeon: [name], MD;  Location: [facility];  Service: Cardiovascular;  Laterality: N/A;  . RIGHT HEART CATH N/A 07/12/2017   Procedure: RIGHT HEART CATH;  Surgeon: [name], MD;  Location: [facility];  Service: Cardiovascular;  Laterality: N/A;  . TONSILLECTOMY    . UPPER GASTROINTESTINAL ENDOSCOPY  03/15/2011   EGD ED BANDING/TCS  . UPPER GASTROINTESTINAL ENDOSCOPY  09/05/2010  . UPPER GASTROINTESTINAL ENDOSCOPY  08/11/2010  . VAGINAL HYSTERECTOMY       OB History   No obstetric history on file.     Family History  Problem Relation Age of Onset  . Lung cancer Mother   . Diabetes Father   .  Diabetes Sister   . Hypertension Sister   . Hypothyroidism Sister   . Colon cancer Brother   . Diabetes Sister   . Hypothyroidism Brother   . Healthy Daughter   . Obesity Daughter   . Hypertension Daughter     Social History   Tobacco Use  . Smoking status: Never Smoker  . Smokeless tobacco: Never Used  Substance Use Topics  . Alcohol use: No    Alcohol/week: 0.0 standard drinks  . Drug use: No    Home Medications Prior to Admission medications   Medication Sig Start Date End Date Taking? Authorizing Provider  bumetanide (BUMEX) 2 MG tablet Take 2 mg by mouth 2 (two) times daily.  09/10/19  Yes Rehman, Mechele Dawley, MD  busPIRone (BUSPAR) 10 MG tablet Take 10 mg by mouth 2 (two) times daily.   Yes [provider]  Cholecalciferol 25 MCG (1000 UT) tablet Take 1,000 Units by mouth every other day.    Yes [provider]  HUMALOG 100 UNIT/ML injection Inject 15-35 Units into the skin 3 (three) times daily with meals. Sliding scale insulin 100-150= 15 units 150-200= 20 units 250-300=  30 units 300-350= 35 units 07/01/18  Yes [provider]  Insulin Glargine (LANTUS SOLOSTAR) 100 UNIT/ML Solostar Pen Inject 10-55 Units into the skin See admin instructions. Inject 55 units subcutaneously in the morning & 10 units subcutaneously at night. 03/06/19  Yes [provider]  lactulose (CHRONULAC) 10 GM/15ML solution Take 60 mLs (40 g total) by mouth 4 (four) times daily. If having watery stools >3 times per day, then reduce to 3 times daily. 09/02/19 12/01/19 Yes Enzo Bi, MD  levothyroxine (SYNTHROID, LEVOTHROID) 25 MCG tablet Take 25 mcg by mouth daily before breakfast.    Yes [provider]  lidocaine-prilocaine (EMLA) cream Apply 1 application topically daily as needed. 08/19/19  Yes [provider]  magnesium oxide (MAG-OX) 400 MG tablet Take 400 mg by mouth daily.    Yes [provider]  pantoprazole (PROTONIX) 40 MG tablet Take  1 tablet (40 mg total) by mouth daily before breakfast. 09/09/19  Yes Rehman, Mechele Dawley, MD  Pediatric Multivitamins-Iron (FLINTSTONES PLUS IRON PO) Take 2 tablets by mouth daily.   Yes [provider]  potassium chloride SA (KLOR-CON) 20 MEQ tablet Take 2 tablets (40 mEq total) by mouth daily. 09/02/19  Yes Enzo Bi, MD  rifaximin (XIFAXAN) 550 MG TABS tablet Take 1 tablet (550 mg total) by mouth 2 (two) times daily. 02/27/19  Yes Barton Dubois, MD  rosuvastatin (CRESTOR) 10 MG tablet Take 10 mg by mouth daily. 09/10/19  Yes [provider]  sertraline (ZOLOFT) 50 MG tablet Take 50 mg by mouth daily.  03/10/19  Yes [provider]  trimethoprim-polymyxin b (POLYTRIM) ophthalmic solution Place 1 drop into the left eye See admin instructions. Use 3 to 4 times daily for 2 days following monthly eye injection 05/06/18  Yes [provider]  zinc sulfate 220 (50 Zn) MG capsule Take 1 capsule (220 mg total) by mouth daily. 08/01/18  Yes Barton Dubois, MD  atenolol (TENORMIN) 25 MG tablet Take 0.5 tablets (12.5 mg total) by mouth daily. Patient not taking: Reported on 09/29/2019 06/02/19 06/01/20  Roxan Hockey, MD    Allergies    Ferumoxytol and Tramadol hcl  Review of Systems   Review of Systems  Constitutional: Positive for fatigue. Negative for appetite change.  HENT: Negative for congestion, ear discharge and sinus pressure.   Eyes: Negative for discharge.  Respiratory: Negative for cough.   Cardiovascular: Negative for chest pain.  Gastrointestinal: Negative for abdominal pain and diarrhea.  Genitourinary: Negative for frequency and hematuria.  Musculoskeletal: Negative for back pain.  Skin: Negative for rash.  Neurological: Negative for seizures and headaches.  Psychiatric/Behavioral: Negative for hallucinations.    Physical Exam Updated Vital Signs BP (!) 125/39 (BP Location: Right Arm)   Pulse 81   Temp 98.2 F (36.8 C) (Oral)   Resp 19   Ht  5' 4"  (1.626 m)   Wt 89.4 kg   SpO2 99%   BMI 33.81 kg/m   Physical Exam Vitals and nursing note reviewed.  Constitutional:      Appearance: She is well-developed.  HENT:     Head: Normocephalic.     Nose: Nose normal.     Mouth/Throat:     Mouth: Mucous membranes are moist.  Eyes:     General: No scleral icterus.    Comments: Sclera pale  Neck:     Thyroid: No thyromegaly.  Cardiovascular:     Rate and Rhythm: Normal rate and regular rhythm.     Heart  sounds: No murmur. No friction rub. No gallop.   Pulmonary:     Breath sounds: No stridor. No wheezing or rales.  Chest:     Chest wall: No tenderness.  Abdominal:     General: There is no distension.     Tenderness: There is no abdominal tenderness. There is no rebound.  Musculoskeletal:        General: Normal range of motion.     Cervical back: Neck supple.  Lymphadenopathy:     Cervical: No cervical adenopathy.  Skin:    Findings: No erythema or rash.  Neurological:     Mental Status: She is alert and oriented to person, place, and time.     Motor: No abnormal muscle tone.     Coordination: Coordination normal.  Psychiatric:        Behavior: Behavior normal.     ED Results / Procedures / Treatments   Labs (all labs ordered are listed, but only abnormal results are displayed) Labs Reviewed  COMPREHENSIVE METABOLIC PANEL - Abnormal; Notable for the following components:      Result Value   Potassium 3.2 (*)    Chloride 112 (*)    Glucose, Bld 190 (*)    Calcium 7.8 (*)    Total Protein 4.8 (*)    Albumin 2.2 (*)    Total Bilirubin 2.3 (*)    All other components within normal limits  CBC - Abnormal; Notable for the following components:   WBC 2.2 (*)    RBC 2.45 (*)    Hemoglobin 6.0 (*)    HCT 21.5 (*)    MCH 24.5 (*)    MCHC 27.9 (*)    RDW 21.8 (*)    Platelets 41 (*)    All other components within normal limits  PROTIME-INR - Abnormal; Notable for the following components:   Prothrombin Time  20.3 (*)    INR 1.7 (*)    All other components within normal limits  POC OCCULT BLOOD, ED  TYPE AND SCREEN  PREPARE RBC (CROSSMATCH)    EKG None  Radiology No results found.  Procedures Procedures (including critical care time)  Medications Ordered in ED Medications  0.9 %  sodium chloride infusion (10 mL/hr Intravenous New Bag/Given 09/29/19 1751)    ED Course  I have reviewed the triage vital signs and the nursing notes.  Pertinent labs & imaging results that were available during my care of the patient were reviewed by me and considered in my medical decision making (see chart for details).    MDM Rules/Calculators/A&P                      CRITICAL CARE Performed by: Milton Ferguson Total critical care time: 45 minutes Critical care time was exclusive of separately billable procedures and treating other patients. Critical care was necessary to treat or prevent imminent or life-threatening deterioration. Critical care was time spent personally by me on the following activities: development of treatment plan with patient and/or surrogate as well as nursing, discussions with consultants, evaluation of patient's response to treatment, examination of patient, obtaining history from patient or surrogate, ordering and performing treatments and interventions, ordering and review of laboratory studies, ordering and review of radiographic studies, pulse oximetry and re-evaluation of patient's condition. Patient with symptomatic anemia.  She will be transfused and admitted to medicine Final Clinical Impression(s) / ED Diagnoses Final diagnoses:  Iron deficiency anemia due to chronic blood loss    Rx /  DC Orders ED Discharge Orders    None       Milton Ferguson, MD 09/29/19 [id number]

## 2019-09-29 NOTE — Telephone Encounter (Signed)
Patient called with a Progress report about her weights.  09/22/19 -184 lbs , 09/23/19 -187 lbs .09/24/19 - 187.1 lbs , 09/25/19 -189.9 lbs , 09/26/19 -191 lbs , 09/27/19 - 195.2 lbs , 09/28/19 - 197.3 lbs ,09/29/19 -197 lbs.  Patient states that she is still taking Bumex . One in the morning and 1 at night.  She is requesting that she get a call back.   Patient was sent a My Chart Message.

## 2019-09-29 NOTE — H&P (Signed)
History and Physical    Maria [name] [id number]:[id number] DOB: 1955-12-04 DOA: 09/29/2019  PCP: Glenda Chroman, MD   Patient coming from: Home  I have personally briefly reviewed patient's old medical records in [place]  Chief Complaint: Weakness, Low hemoglobin  HPI: CLARITZA JULY is a 64 y.o. female with medical history significant for nonalcoholic liver cirrhosis, hepatic encephalopathy, esophageal varices, GAVE-intermittently requiring blood transfusions, diastolic CHF diabetes mellitus.  Patient was sent to the ED by her gastroenterologist with reports of hemoglobin of 5.  Patient reports generalized weakness.  No abdominal or chest pain.  No black stools.  No vomiting.  Recent Hospitalization 12/9-12/14 for hepatic encephalopathy patient did well with increased dose lactulose.  Also asymptomatic COVID-19 infection and acute kidney injury.  Patient also received total of 2 units PRBC for hemoglobin of 6.  ED Course: Blood pressure systolic 1 1 8-1 25.  Hemoglobin 6.  Platelets 41.  INR 1.7.  2 units PRBCs ordered for transfusion.  Hospitalist to admit for acute anemia.  Review of Systems: As per HPI all other systems reviewed and negative.  Past Medical History:  Diagnosis Date  . Anemia   . CHF (congestive heart failure) ([place])   . Cirrhosis ([place])   . Depression   . Diabetes mellitus   . Esophageal bleed, non-variceal   . Eye hemorrhage    Behind left eye  . Fibromyalgia   . Hypercholesteremia   . Hypertension   . Hypothyroidism   . NAFLD (nonalcoholic fatty liver disease)   . Osteoarthrosis   . Osteoporosis   . PONV (postoperative nausea and vomiting)   . UTI (urinary tract infection) 11/12 end of the month   Patient feels that she passed a kidney stone at that time    Past Surgical History:  Procedure Laterality Date  . APPENDECTOMY  1980  . BILATERAL SALPINGOOPHORECTOMY    . CHOLECYSTECTOMY    . COLONOSCOPY  03/15/2011  . COLONOSCOPY N/A 03/24/2016   Procedure: COLONOSCOPY;  Surgeon: [name], MD;  [facility];  Service: Endoscopy;  Laterality: N/A;  855  . ESOPHAGEAL BANDING  04/24/2012   Procedure: ESOPHAGEAL BANDING;  Surgeon: [name], MD;  [facility];  Service: Endoscopy;  Laterality: N/A;  . Esophageal BANDING  08/03/2012   [facility] in [place] , [place]  09/24/2012   Procedure: ESOPHAGEAL BANDING;  Surgeon: [name], MD;  Location: [facility];  Service: Endoscopy;  Laterality: N/A;  Banding x 3  . ESOPHAGEAL BANDING N/A 01/29/2013   Procedure: ESOPHAGEAL BANDING;  Surgeon: [name], MD;  [facility];  Service: Endoscopy;  Laterality: N/A;  . ESOPHAGEAL BANDING N/A 06/19/2014   Procedure: ESOPHAGEAL BANDING;  Surgeon: [name], MD;  [facility];  Service: Endoscopy;  Laterality: N/A;  . ESOPHAGEAL BANDING N/A 01/05/2016   Procedure: ESOPHAGEAL BANDING;  Surgeon: [name], MD;  [facility];  Service: Endoscopy;  Laterality: N/A;  . ESOPHAGEAL BANDING N/A 08/03/2016   Procedure: ESOPHAGEAL BANDING;  Surgeon: [name], MD;  [facility];  Service: Endoscopy;  Laterality: N/A;  . ESOPHAGEAL BANDING N/A 05/02/2017   Procedure: ESOPHAGEAL BANDING;  Surgeon: [name], MD;  [facility];  Service: Endoscopy;  Laterality: N/A;  . ESOPHAGOGASTRODUODENOSCOPY  04/24/2012   Procedure: ESOPHAGOGASTRODUODENOSCOPY (EGD);  Surgeon: [name], MD;  [facility];  Service: Endoscopy;  Laterality: N/A;  300  . ESOPHAGOGASTRODUODENOSCOPY N/A 01/29/2013   Procedure: ESOPHAGOGASTRODUODENOSCOPY (EGD);  Surgeon: [name], MD;  [facility];  Service: Endoscopy;  Laterality: N/A;  1200  . ESOPHAGOGASTRODUODENOSCOPY N/A 05/22/2013   Procedure: ESOPHAGOGASTRODUODENOSCOPY (EGD);  Surgeon: [name], MD;  [facility];  Service: Endoscopy;  Laterality: N/A;   1:55  . ESOPHAGOGASTRODUODENOSCOPY N/A 12/31/2013   Procedure: ESOPHAGOGASTRODUODENOSCOPY (EGD);  Surgeon: [name], MD;  [facility];  Service: Endoscopy;  Laterality: N/A;  1200  . ESOPHAGOGASTRODUODENOSCOPY N/A 06/19/2014   Procedure: ESOPHAGOGASTRODUODENOSCOPY (EGD);  Surgeon: [name], MD;  [facility];  Service: Endoscopy;  Laterality: N/A;  1055  . ESOPHAGOGASTRODUODENOSCOPY N/A 01/15/2015   Procedure: ESOPHAGOGASTRODUODENOSCOPY (EGD);  Surgeon: [name], MD;  [facility];  Service: Endoscopy;  Laterality: N/A;  730 - moved to 9:45 - moved to 1250-Ann notified pt  . ESOPHAGOGASTRODUODENOSCOPY N/A 06/30/2015   Procedure: ESOPHAGOGASTRODUODENOSCOPY (EGD);  Surgeon: [name], MD;  [facility];  Service: Endoscopy;  Laterality: N/A;  1200  . ESOPHAGOGASTRODUODENOSCOPY N/A 01/05/2016   Procedure: ESOPHAGOGASTRODUODENOSCOPY (EGD);  Surgeon: [name], MD;  [facility];  Service: Endoscopy;  Laterality: N/A;  830  . ESOPHAGOGASTRODUODENOSCOPY N/A 08/03/2016   Procedure: ESOPHAGOGASTRODUODENOSCOPY (EGD);  Surgeon: [name], MD;  [facility];  Service: Endoscopy;  Laterality: N/A;  930  . ESOPHAGOGASTRODUODENOSCOPY N/A 05/02/2017   Procedure: ESOPHAGOGASTRODUODENOSCOPY (EGD);  Surgeon: [name], MD;  [facility];  Service: Endoscopy;  Laterality: N/A;  12:00  . ESOPHAGOGASTRODUODENOSCOPY N/A 08/17/2017   Procedure: ESOPHAGOGASTRODUODENOSCOPY (EGD);  Surgeon: [name], MD;  [facility];  Service: Endoscopy;  Laterality: N/A;  8:15  . ESOPHAGOGASTRODUODENOSCOPY N/A 09/12/2017   Procedure: ESOPHAGOGASTRODUODENOSCOPY (EGD);  Surgeon: [name], MD;  [facility];  Service: Endoscopy;  Laterality: N/A;  1040  . ESOPHAGOGASTRODUODENOSCOPY N/A 10/10/2017   Procedure: ESOPHAGOGASTRODUODENOSCOPY (EGD);  Surgeon: [name], MD;  [facility]  [facility];  Service: Endoscopy;  Laterality: N/A;  225  . ESOPHAGOGASTRODUODENOSCOPY N/A 11/09/2017   Procedure: ESOPHAGOGASTRODUODENOSCOPY (EGD);  Surgeon: [name], MD;  [facility];  Service: Endoscopy;  Laterality: N/A;  . ESOPHAGOGASTRODUODENOSCOPY (EGD) WITH PROPOFOL  09/24/2012   Procedure: ESOPHAGOGASTRODUODENOSCOPY (EGD) WITH PROPOFOL;  Surgeon: [name], MD;  [facility];  Service: Endoscopy;  Laterality: N/A;  GE junction at 36  . ESOPHAGOGASTRODUODENOSCOPY (EGD) WITH PROPOFOL N/A 07/06/2017   Procedure: ESOPHAGOGASTRODUODENOSCOPY (EGD) WITH PROPOFOL;  Surgeon: [name], MD;  [facility];  Service: Endoscopy;  Laterality: N/A;  . ESOPHAGOGASTRODUODENOSCOPY W/ BANDING  08/2010  . GIVENS CAPSULE STUDY N/A 07/05/2017   Procedure: GIVENS CAPSULE STUDY;  Surgeon: [name], MD;  [facility];  Service: Endoscopy;  Laterality: N/A;  . HOT HEMOSTASIS N/A 08/17/2017   Procedure: HOT HEMOSTASIS (ARGON PLASMA COAGULATION/BICAP);  Surgeon: [name], MD;  [facility];  Service: Endoscopy;  Laterality: N/A;  . HOT HEMOSTASIS  09/12/2017   Procedure: HOT HEMOSTASIS (ARGON PLASMA COAGULATION/BICAP);  Surgeon: [name], MD;  [facility];  Service: Endoscopy;;  gastric  . HOT HEMOSTASIS  10/10/2017   Procedure: HOT HEMOSTASIS (ARGON PLASMA COAGULATION/BICAP);  Surgeon: [name], MD;  [facility];  Service: Endoscopy;;  . POLYPECTOMY  03/24/2016   Procedure: POLYPECTOMY;  Surgeon: Bernadene Person  Gloriann Loan, MD;  [facility];  Service: Endoscopy;;  sigmoid polyp  . PORTACATH PLACEMENT Right 06/02/2019   Procedure: INSERTION PORT-A-CATH;  Surgeon: Aviva Signs, MD;  Location: [facility];  Service: General;  Laterality: Right;  . RIGHT HEART CATH N/A 04/16/2017   Procedure: Right Heart Cath;  Surgeon: [name], MD;  Location: [place] [facility];  Service: Cardiovascular;   Laterality: N/A;  . RIGHT HEART CATH N/A 07/12/2017   Procedure: RIGHT HEART CATH;  Surgeon: [name], MD;  Location: [place] [facility];  Service: Cardiovascular;  Laterality: N/A;  . TONSILLECTOMY    . UPPER GASTROINTESTINAL ENDOSCOPY  03/15/2011   EGD ED BANDING/TCS  . UPPER GASTROINTESTINAL ENDOSCOPY  09/05/2010  . UPPER GASTROINTESTINAL ENDOSCOPY  08/11/2010  . VAGINAL HYSTERECTOMY       reports that she has never smoked. She has never used smokeless tobacco. She reports that she does not drink alcohol or use drugs.  Allergies  Allergen Reactions  . Ferumoxytol Other (See Comments)    Patient has severe back and chest pain when receiving FERAHEME infusions.   . Tramadol Hcl Other (See Comments)    WEAKNESS     Family History  Problem Relation Age of Onset  . Lung cancer Mother   . Diabetes Father   . Diabetes Sister   . Hypertension Sister   . Hypothyroidism Sister   . Colon cancer Brother   . Diabetes Sister   . Hypothyroidism Brother   . Healthy Daughter   . Obesity Daughter   . Hypertension Daughter     Prior to Admission medications   Medication Sig Start Date End Date Taking? Authorizing Provider  bumetanide (BUMEX) 2 MG tablet Take 2 mg by mouth 2 (two) times daily.  09/10/19  Yes Rehman, Mechele Dawley, MD  busPIRone (BUSPAR) 10 MG tablet Take 10 mg by mouth 2 (two) times daily.   Yes [provider]  Cholecalciferol 25 MCG (1000 UT) tablet Take 1,000 Units by mouth every other day.    Yes [provider]  HUMALOG 100 UNIT/ML injection Inject 15-35 Units into the skin 3 (three) times daily with meals. Sliding scale insulin 100-150= 15 units 150-200= 20 units 250-300= 30 units 300-350= 35 units 07/01/18  Yes [provider]  Insulin Glargine (LANTUS SOLOSTAR) 100 UNIT/ML Solostar Pen Inject 10-55 Units into the skin See admin instructions. Inject 55 units subcutaneously in the morning & 10 units subcutaneously at night. 03/06/19   Yes [provider]  lactulose (CHRONULAC) 10 GM/15ML solution Take 60 mLs (40 g total) by mouth 4 (four) times daily. If having watery stools >3 times per day, then reduce to 3 times daily. 09/02/19 12/01/19 Yes Enzo Bi, MD  levothyroxine (SYNTHROID, LEVOTHROID) 25 MCG tablet Take 25 mcg by mouth daily before breakfast.    Yes [provider]  lidocaine-prilocaine (EMLA) cream Apply 1 application topically daily as needed. 08/19/19  Yes [provider]  magnesium oxide (MAG-OX) 400 MG tablet Take 400 mg by mouth daily.    Yes [provider]  pantoprazole (PROTONIX) 40 MG tablet Take 1 tablet (40 mg total) by mouth daily before breakfast. 09/09/19  Yes Rehman, Mechele Dawley, MD  Pediatric Multivitamins-Iron (FLINTSTONES PLUS IRON PO) Take 2 tablets by mouth daily.   Yes [provider]  potassium chloride SA (KLOR-CON) 20 MEQ tablet Take 2 tablets (40 mEq total) by mouth daily. 09/02/19  Yes Enzo Bi, MD  rifaximin Doreene Nest) 550  MG TABS tablet Take 1 tablet (550 mg total) by mouth 2 (two) times daily. 02/27/19  Yes Barton Dubois, MD  rosuvastatin (CRESTOR) 10 MG tablet Take 10 mg by mouth daily. 09/10/19  Yes [provider]  sertraline (ZOLOFT) 50 MG tablet Take 50 mg by mouth daily.  03/10/19  Yes [provider]  trimethoprim-polymyxin b (POLYTRIM) ophthalmic solution Place 1 drop into the left eye See admin instructions. Use 3 to 4 times daily for 2 days following monthly eye injection 05/06/18  Yes [provider]  zinc sulfate 220 (50 Zn) MG capsule Take 1 capsule (220 mg total) by mouth daily. 08/01/18  Yes Barton Dubois, MD  atenolol (TENORMIN) 25 MG tablet Take 0.5 tablets (12.5 mg total) by mouth daily. Patient not taking: Reported on 09/29/2019 06/02/19 06/01/20  Roxan Hockey, MD    Physical Exam: Vitals:   09/29/19 1635 09/29/19 1638 09/29/19 1920  BP:  (!) 125/39 (!) 118/37  Pulse:  81 78  Resp:  19 17  Temp:   98.2 F (36.8 C)   TempSrc:  Oral   SpO2:  99% 99%  Weight: 89.4 kg    Height: 5' 4"  (1.626 m)      Constitutional: NAD, calm, comfortable Vitals:   09/29/19 1635 09/29/19 1638 09/29/19 1920  BP:  (!) 125/39 (!) 118/37  Pulse:  81 78  Resp:  19 17  Temp:  98.2 F (36.8 C)   TempSrc:  Oral   SpO2:  99% 99%  Weight: 89.4 kg    Height: 5' 4"  (1.626 m)     Eyes: PERRL, lids and conjunctivae normal ENMT: Mucous membranes are moist. Posterior pharynx clear of any exudate or lesions.Normal dentition.  Neck: normal, supple, no masses, no thyromegaly Respiratory: clear to auscultation bilaterally,  Normal respiratory effort. No accessory muscle use.  Cardiovascular: Regular rate and rhythm, 2+ pitting edema to upper legs, extremity edema. 2+ pedal pulses. No carotid bruits.  Abdomen: no tenderness, no masses palpated. No hepatosplenomegaly. Bowel sounds positive.  Musculoskeletal: no clubbing / cyanosis. No joint deformity upper and lower extremities. Good ROM, no contractures. Normal muscle tone.  Skin: no rashes, lesions, ulcers. No induration Neurologic: CN 2-12 grossly intact. Sensation intact, DTR normal. Strength 5/5 in all 4.  Psychiatric: Normal judgment and insight. Alert and oriented x 3. Normal mood.   Labs on Admission: I have personally reviewed following labs and imaging studies  CBC: Recent Labs  Lab 09/29/19 1319 09/29/19 1718  WBC  --  2.2*  HGB 5.8* 6.0*  HCT 20.9* 21.5*  MCV  --  87.8  PLT  --  41*   Basic Metabolic Panel: Recent Labs  Lab 09/29/19 1718  NA 140  K 3.2*  CL 112*  CO2 23  GLUCOSE 190*  BUN 17  CREATININE 0.91  CALCIUM 7.8*   Liver Function Tests: Recent Labs  Lab 09/29/19 1718  AST 33  ALT 29  ALKPHOS 90  BILITOT 2.3*  PROT 4.8*  ALBUMIN 2.2*   Coagulation Profile: Recent Labs  Lab 09/29/19 1718  INR 1.7*    Radiological Exams on Admission: No results found.  EKG: None.   Assessment/Plan Active Problems:    Acute anemia   Acute on chronic anemia-hemoglobin 6, with history of  GAVE and esophageal varices requiring frequent  Intermittent transfuions.  No obvious blood loss at this time.  Denies dark stools.  Multiple EGDs since 2018.  Last endoscopy per care everywhere 08/2019, portal hypertensive gastropathy, gastric  antral vascular ectasia banded x4, treated with argon plasma coagulation.  She is also thrombocytopenic and mildly elevated INR, increasing predisposition to bleeding. -Follows with gastroenterology/liver clinic at [facility] -Transfuse 2 units PRBC -IV Lasix 40 mg between transfusions -Monitor hemoglobin closely -Vitamin K 5 mg p.o.  Recent asymptomatic COVID-19 infection- tested + 08/27/19. -Will not retest -Regular/universal precautions  NASH liver cirrhosis with history of hepatic encephalopathy, esophageal varices and thrombocytopenia-follows with [facility] gastroenterology/liver clinic.  Per care everywhere notes 08/12/2019, patient is not currently a transplant candidate due to frailty and poor functional status.  Underwent TIPS  procedure 07/08/2018.  She reports at least 3 bowel movements daily. -Resume home Xifaxan, lactulose -Resume home Bumex -Resume home Protonix  Diastolic CHF- decompensated, weights trending up, 187 on recent hospitalization to 197 today.  Reports worsening lower extremity swelling, no dyspnea..  Echo 11/2016, EF 60 to 65%, grade indeterminate diastolic dysfunction.  Hypoalbuminemia of 2.2 and acute anemia likely playing a major role.  Patient's diuretic was on hold from recent hospitalization due to acute kidney injury. - IV Lasix 40 mg Q12h. - BNP check - hold home Bumex for now  -Strict input output Daily weights -BMP a.m.  Diabetes mellitus-190.  Home medications-Humalog and Lantus sliding scale. - Resume home lantus at reduced dose 35 units daily (from 55 units daily) - SSI - m  DVT prophylaxis: SCDS Code Status: Full Code Family Communication: None at  bedside Disposition Plan: 1- 2 days Consults called: none Admission status: Obs, tele    Bethena Roys MD [facility]  09/29/2019, 9:45 PM

## 2019-09-29 NOTE — ED Triage Notes (Signed)
[name] did labs today for weekly CBC. HGB 5 today. Pt pale, fatigued. Denies pain at this time

## 2019-09-30 ENCOUNTER — Encounter (INDEPENDENT_AMBULATORY_CARE_PROVIDER_SITE_OTHER): Payer: Self-pay

## 2019-09-30 ENCOUNTER — Encounter (INDEPENDENT_AMBULATORY_CARE_PROVIDER_SITE_OTHER): Payer: Self-pay | Admitting: *Deleted

## 2019-09-30 ENCOUNTER — Encounter (HOSPITAL_COMMUNITY): Payer: Self-pay | Admitting: Internal Medicine

## 2019-09-30 DIAGNOSIS — K766 Portal hypertension: Secondary | ICD-10-CM | POA: Diagnosis present

## 2019-09-30 DIAGNOSIS — K31811 Angiodysplasia of stomach and duodenum with bleeding: Secondary | ICD-10-CM | POA: Diagnosis present

## 2019-09-30 DIAGNOSIS — K31819 Angiodysplasia of stomach and duodenum without bleeding: Secondary | ICD-10-CM | POA: Diagnosis present

## 2019-09-30 DIAGNOSIS — E1169 Type 2 diabetes mellitus with other specified complication: Secondary | ICD-10-CM

## 2019-09-30 DIAGNOSIS — E8770 Fluid overload, unspecified: Secondary | ICD-10-CM | POA: Diagnosis not present

## 2019-09-30 DIAGNOSIS — Z801 Family history of malignant neoplasm of trachea, bronchus and lung: Secondary | ICD-10-CM | POA: Diagnosis not present

## 2019-09-30 DIAGNOSIS — I5033 Acute on chronic diastolic (congestive) heart failure: Secondary | ICD-10-CM | POA: Diagnosis present

## 2019-09-30 DIAGNOSIS — K7460 Unspecified cirrhosis of liver: Secondary | ICD-10-CM

## 2019-09-30 DIAGNOSIS — D696 Thrombocytopenia, unspecified: Secondary | ICD-10-CM | POA: Diagnosis not present

## 2019-09-30 DIAGNOSIS — Z800 Family history of malignant neoplasm of digestive organs: Secondary | ICD-10-CM | POA: Diagnosis not present

## 2019-09-30 DIAGNOSIS — Z794 Long term (current) use of insulin: Secondary | ICD-10-CM | POA: Diagnosis not present

## 2019-09-30 DIAGNOSIS — E8809 Other disorders of plasma-protein metabolism, not elsewhere classified: Secondary | ICD-10-CM | POA: Diagnosis present

## 2019-09-30 DIAGNOSIS — E119 Type 2 diabetes mellitus without complications: Secondary | ICD-10-CM | POA: Diagnosis present

## 2019-09-30 DIAGNOSIS — Z87442 Personal history of urinary calculi: Secondary | ICD-10-CM | POA: Diagnosis not present

## 2019-09-30 DIAGNOSIS — D649 Anemia, unspecified: Secondary | ICD-10-CM | POA: Diagnosis not present

## 2019-09-30 DIAGNOSIS — D6959 Other secondary thrombocytopenia: Secondary | ICD-10-CM | POA: Diagnosis present

## 2019-09-30 DIAGNOSIS — D62 Acute posthemorrhagic anemia: Secondary | ICD-10-CM | POA: Diagnosis present

## 2019-09-30 DIAGNOSIS — I110 Hypertensive heart disease with heart failure: Secondary | ICD-10-CM | POA: Diagnosis present

## 2019-09-30 DIAGNOSIS — Z833 Family history of diabetes mellitus: Secondary | ICD-10-CM | POA: Diagnosis not present

## 2019-09-30 DIAGNOSIS — I8510 Secondary esophageal varices without bleeding: Secondary | ICD-10-CM | POA: Diagnosis present

## 2019-09-30 DIAGNOSIS — Z888 Allergy status to other drugs, medicaments and biological substances status: Secondary | ICD-10-CM | POA: Diagnosis not present

## 2019-09-30 DIAGNOSIS — I5082 Biventricular heart failure: Secondary | ICD-10-CM | POA: Diagnosis present

## 2019-09-30 DIAGNOSIS — E039 Hypothyroidism, unspecified: Secondary | ICD-10-CM | POA: Diagnosis present

## 2019-09-30 DIAGNOSIS — K7290 Hepatic failure, unspecified without coma: Secondary | ICD-10-CM | POA: Diagnosis not present

## 2019-09-30 DIAGNOSIS — E7800 Pure hypercholesterolemia, unspecified: Secondary | ICD-10-CM | POA: Diagnosis present

## 2019-09-30 DIAGNOSIS — Z8249 Family history of ischemic heart disease and other diseases of the circulatory system: Secondary | ICD-10-CM | POA: Diagnosis not present

## 2019-09-30 DIAGNOSIS — Z90710 Acquired absence of both cervix and uterus: Secondary | ICD-10-CM | POA: Diagnosis not present

## 2019-09-30 DIAGNOSIS — N179 Acute kidney failure, unspecified: Secondary | ICD-10-CM | POA: Diagnosis present

## 2019-09-30 DIAGNOSIS — I50813 Acute on chronic right heart failure: Secondary | ICD-10-CM | POA: Diagnosis not present

## 2019-09-30 DIAGNOSIS — K7469 Other cirrhosis of liver: Secondary | ICD-10-CM | POA: Diagnosis not present

## 2019-09-30 DIAGNOSIS — K7581 Nonalcoholic steatohepatitis (NASH): Secondary | ICD-10-CM | POA: Diagnosis present

## 2019-09-30 DIAGNOSIS — D500 Iron deficiency anemia secondary to blood loss (chronic): Secondary | ICD-10-CM | POA: Diagnosis present

## 2019-09-30 LAB — CBC
HCT: 24.7 % — ABNORMAL LOW (ref 36.0–46.0)
Hemoglobin: 7.4 g/dL — ABNORMAL LOW (ref 12.0–15.0)
MCH: 26.7 pg (ref 26.0–34.0)
MCHC: 30 g/dL (ref 30.0–36.0)
MCV: 89.2 fL (ref 80.0–100.0)
Platelets: 43 10*3/uL — ABNORMAL LOW (ref 150–400)
RBC: 2.77 MIL/uL — ABNORMAL LOW (ref 3.87–5.11)
RDW: 19.7 % — ABNORMAL HIGH (ref 11.5–15.5)
WBC: 3 10*3/uL — ABNORMAL LOW (ref 4.0–10.5)
nRBC: 0 % (ref 0.0–0.2)

## 2019-09-30 LAB — BASIC METABOLIC PANEL
Anion gap: 3 — ABNORMAL LOW (ref 5–15)
BUN: 17 mg/dL (ref 8–23)
CO2: 24 mmol/L (ref 22–32)
Calcium: 7.7 mg/dL — ABNORMAL LOW (ref 8.9–10.3)
Chloride: 109 mmol/L (ref 98–111)
Creatinine, Ser: 1.02 mg/dL — ABNORMAL HIGH (ref 0.44–1.00)
GFR calc Af Amer: >60 mL/min (ref >60)
GFR calc non Af Amer: 58 mL/min — ABNORMAL LOW (ref >60)
Glucose, Bld: 100 mg/dL — ABNORMAL HIGH (ref 70–99)
Potassium: 3.5 mmol/L (ref 3.5–5.1)
Sodium: 136 mmol/L (ref 135–145)

## 2019-09-30 LAB — GLUCOSE, CAPILLARY
Glucose-Capillary: 149 mg/dL — ABNORMAL HIGH (ref 70–99)
Glucose-Capillary: 101 mg/dL — ABNORMAL HIGH (ref 70–99)
Glucose-Capillary: 210 mg/dL — ABNORMAL HIGH (ref 70–99)
Glucose-Capillary: 134 mg/dL — ABNORMAL HIGH (ref 70–99)
Glucose-Capillary: 193 mg/dL — ABNORMAL HIGH (ref 70–99)

## 2019-09-30 LAB — HEMOGLOBIN A1C
Hgb A1c MFr Bld: 4.9 % (ref 4.8–5.6)
Mean Plasma Glucose: 93.93 mg/dL

## 2019-09-30 MED ORDER — POLYETHYLENE GLYCOL 3350 17 G PO PACK: 17.0000 g | PACK | Freq: Every day | ORAL | Status: DC | PRN

## 2019-09-30 MED ORDER — LEVOTHYROXINE SODIUM 25 MCG PO TABS
25.0000 ug | ORAL_TABLET | Freq: Every day | ORAL | Status: DC
  Administered 2019-09-30 – 2019-10-05 (×6): 25 ug via ORAL
  Filled 2019-09-30 (×6): qty 1

## 2019-09-30 MED ORDER — LACTULOSE 10 GM/15ML PO SOLN
40.0000 g | Freq: Four times a day (QID) | ORAL | Status: DC
  Administered 2019-09-30 – 2019-10-05 (×15): 40 g via ORAL
  Filled 2019-09-30 (×17): qty 60

## 2019-09-30 MED ORDER — BUMETANIDE 1 MG PO TABS
2.0000 mg | ORAL_TABLET | Freq: Two times a day (BID) | ORAL | Status: DC
  Administered 2019-09-30 – 2019-10-02 (×5): 2 mg via ORAL
  Filled 2019-09-30 (×6): qty 2

## 2019-09-30 MED ORDER — ACETAMINOPHEN 325 MG PO TABS
650.0000 mg | ORAL_TABLET | Freq: Four times a day (QID) | ORAL | Status: DC | PRN
  Administered 2019-10-02: 650 mg via ORAL
  Filled 2019-09-30: qty 2

## 2019-09-30 MED ORDER — PANTOPRAZOLE SODIUM 40 MG PO TBEC
40.0000 mg | DELAYED_RELEASE_TABLET | Freq: Every day | ORAL | Status: DC
  Administered 2019-09-30 – 2019-10-05 (×6): 40 mg via ORAL
  Filled 2019-09-30 (×6): qty 1

## 2019-09-30 MED ORDER — MAGNESIUM OXIDE 400 (241.3 MG) MG PO TABS
400.0000 mg | ORAL_TABLET | Freq: Every day | ORAL | Status: DC
  Administered 2019-09-30 – 2019-10-05 (×6): 400 mg via ORAL
  Filled 2019-09-30 (×14): qty 1

## 2019-09-30 MED ORDER — ACETAMINOPHEN 650 MG RE SUPP: 650.0000 mg | Freq: Four times a day (QID) | RECTAL | Status: DC | PRN

## 2019-09-30 MED ORDER — ONDANSETRON HCL 4 MG/2ML IJ SOLN: 4.0000 mg | Freq: Four times a day (QID) | INTRAMUSCULAR | Status: DC | PRN

## 2019-09-30 MED ORDER — POTASSIUM CHLORIDE CRYS ER 20 MEQ PO TBCR
40.0000 meq | EXTENDED_RELEASE_TABLET | Freq: Every day | ORAL | Status: DC
  Administered 2019-09-30 – 2019-10-01 (×2): 40 meq via ORAL
  Filled 2019-09-30 (×3): qty 2
  Filled 2019-09-30: qty 4

## 2019-09-30 MED ORDER — CHLORHEXIDINE GLUCONATE CLOTH 2 % EX PADS
6.0000 | MEDICATED_PAD | Freq: Every day | CUTANEOUS | Status: DC
  Administered 2019-10-01 – 2019-10-05 (×5): 6 via TOPICAL

## 2019-09-30 MED ORDER — FUROSEMIDE 10 MG/ML IJ SOLN
40.0000 mg | Freq: Two times a day (BID) | INTRAMUSCULAR | Status: DC
  Administered 2019-09-30: 40 mg via INTRAVENOUS
  Filled 2019-09-30: qty 4

## 2019-09-30 MED ORDER — ROSUVASTATIN CALCIUM 10 MG PO TABS
10.0000 mg | ORAL_TABLET | Freq: Every day | ORAL | Status: DC
  Administered 2019-09-30 – 2019-10-04 (×5): 10 mg via ORAL
  Filled 2019-09-30 (×5): qty 1

## 2019-09-30 MED ORDER — SERTRALINE HCL 50 MG PO TABS
50.0000 mg | ORAL_TABLET | Freq: Every day | ORAL | Status: DC
  Administered 2019-09-30 – 2019-10-05 (×6): 50 mg via ORAL
  Filled 2019-09-30 (×6): qty 1

## 2019-09-30 MED ORDER — ONDANSETRON HCL 4 MG PO TABS: 4.0000 mg | ORAL_TABLET | Freq: Four times a day (QID) | ORAL | Status: DC | PRN

## 2019-09-30 MED ORDER — PHYTONADIONE 5 MG PO TABS
5.0000 mg | ORAL_TABLET | Freq: Once | ORAL | Status: AC
  Administered 2019-09-30: 5 mg via ORAL
  Filled 2019-09-30 (×2): qty 1

## 2019-09-30 MED ORDER — RIFAXIMIN 550 MG PO TABS
550.0000 mg | ORAL_TABLET | Freq: Two times a day (BID) | ORAL | Status: DC
  Administered 2019-09-30 – 2019-10-05 (×12): 550 mg via ORAL
  Filled 2019-09-30 (×12): qty 1

## 2019-09-30 MED ORDER — INSULIN GLARGINE 100 UNIT/ML ~~LOC~~ SOLN
35.0000 [IU] | Freq: Every day | SUBCUTANEOUS | Status: DC
  Administered 2019-09-30 – 2019-10-05 (×6): 35 [IU] via SUBCUTANEOUS
  Filled 2019-09-30 (×7): qty 0.35

## 2019-09-30 MED ORDER — BUMETANIDE 1 MG PO TABS: 2.0000 mg | ORAL_TABLET | Freq: Two times a day (BID) | ORAL | Status: DC

## 2019-09-30 MED ORDER — BUSPIRONE HCL 5 MG PO TABS
10.0000 mg | ORAL_TABLET | Freq: Two times a day (BID) | ORAL | Status: DC
  Administered 2019-09-30 – 2019-10-05 (×12): 10 mg via ORAL
  Filled 2019-09-30 (×13): qty 2

## 2019-09-30 NOTE — Care Management Obs Status (Signed)
[place] NOTIFICATION   Patient Details  Name: Maria [name] MRN: [id number] Date of Birth: 1956-03-24   Medicare Observation Status Notification Given:  Cherie Ouch, RN will deliver form to patient)    Tommy Medal 09/30/2019, 2:16 PM

## 2019-09-30 NOTE — Progress Notes (Signed)
PROGRESS NOTE    Maria [name]  JGO:[id number] DOB: 05/18/1956 DOA: 09/29/2019 PCP: Glenda Chroman, MD    Brief Narrative:  64 year old female with a history of nonalcoholic liver cirrhosis, hepatic encephalopathy, esophageal varices, GAVE who has intermittently required transfusion of PRBCs, presents to the hospital with generalized weakness and low hemoglobin.  Patient was sent to the emergency room by her gastroenterologist with reported hemoglobin of 5.  On arrival to the emergency room, hemoglobin was noted to be low at 6.  She was admitted for further transfusion.  She is also noted to have volume overload and is being diuresed.   Assessment & Plan:   Active Problems:   Cirrhosis, nonalcoholic (HCC)   Diabetes mellitus ([place])   Thrombocytopenia ([place])   Esophageal varices in cirrhosis (HCC)   Acute on chronic right-sided congestive heart failure (HCC)   GAVE (gastric antral vascular ectasia)   Acute anemia   1. Acute on chronic anemia.  Hemoglobin of 6 on admission.  She was transfused 2 units of PRBC with improvement of hemoglobin to 7.4.  She does have a history of GAVE and esophageal varices.  Will consult GI to see if endoscopic evaluation is indicated.  Repeat hemoglobin in a.m.  With her chronic blood loss, may be more appropriate to keep her hemoglobin above 8. 2. Acute on chronic right-sided congestive heart failure.  Patient was started on intravenous Lasix, but reports her urine output has been poor.  This has not been recorded.  She reports that she has responded better to Bumex in the past.  Will change Lasix to Bumex.  She does not have significant urine output by tomorrow, can consider albumin infusion. 3. NASH liver cirrhosis with history of hepatic encephalopathy.  Followed at [facility] gastroenterology clinic.  She is status post TIPS procedure in 06/2018.  She is on Xifaxan and lactulose. 4. Diabetes.  She is continued on reduced dose of Lantus.  Continue on sliding scale  insulin. 5. Recent asymptomatic COVID-19 infection.  She had positive test on 08/27/2019.  Since she has not had any symptoms, will not retest.  Regular precautions. 6. Thrombocytopenia.  Secondary to her underlying liver disease.  Continue to monitor   DVT prophylaxis: SCDs Code Status: Full code Family Communication: Discussed with husband at the bedside Disposition Plan: Discharge home once hemoglobin is stable and she is adequately diuresed   Consultants:     Procedures:     Antimicrobials:       Subjective: Had some shortness of breath through the day today.  No chest pain.  Objective: Vitals:   09/30/19 0527 09/30/19 1345 09/30/19 1347 09/30/19 1952  BP: (!) 106/21 (!) 106/41 (!) 109/41   Pulse: 78 79    Resp: 18 17 18    Temp: 97.8 F (36.6 C) 99.1 F (37.3 C) 99.1 F (37.3 C)   TempSrc: Oral Oral Oral   SpO2: 96% 98% 98% 96%  Weight:      Height:        Intake/Output Summary (Last 24 hours) at 09/30/2019 2127 Last data filed at 09/30/2019 2100 Gross per 24 hour  Intake 1650 ml  Output 200 ml  Net 1450 ml   Filed Weights   09/29/19 1635 09/30/19 0500  Weight: 89.4 kg 92.6 kg    Examination:  General exam: Appears calm and comfortable  Respiratory system: Crackles at bases. Respiratory effort normal. Cardiovascular system: S1 & S2 heard, RRR. No JVD, murmurs, rubs, gallops or clicks. 1+ pedal edema.  Gastrointestinal system: Abdomen is nondistended, soft and nontender. No organomegaly or masses felt. Normal bowel sounds heard. Central nervous system: Alert and oriented. No focal neurological deficits. Extremities: Symmetric 5 x 5 power. Skin: No rashes, lesions or ulcers Psychiatry: Judgement and insight appear normal. Mood & affect appropriate.     Data Reviewed: I have personally reviewed following labs and imaging studies  CBC: Recent Labs  Lab 09/29/19 1319 09/29/19 1718 09/30/19 0612  WBC  --  2.2* 3.0*  HGB 5.8* 6.0* 7.4*  HCT 20.9*  21.5* 24.7*  MCV  --  87.8 89.2  PLT  --  41* 43*   Basic Metabolic Panel: Recent Labs  Lab 09/29/19 1718 09/30/19 0612  NA 140 136  K 3.2* 3.5  CL 112* 109  CO2 23 24  GLUCOSE 190* 100*  BUN 17 17  CREATININE 0.91 1.02*  CALCIUM 7.8* 7.7*  MG 2.2  --    GFR: Estimated Creatinine Clearance: 61.5 mL/min (A) (by C-G formula based on SCr of 1.02 mg/dL (H)). Liver Function Tests: Recent Labs  Lab 09/29/19 1718  AST 33  ALT 29  ALKPHOS 90  BILITOT 2.3*  PROT 4.8*  ALBUMIN 2.2*   No results for input(s): LIPASE, AMYLASE in the last 168 hours. No results for input(s): AMMONIA in the last 168 hours. Coagulation Profile: Recent Labs  Lab 09/29/19 1718  INR 1.7*   Cardiac Enzymes: No results for input(s): CKTOTAL, CKMB, CKMBINDEX, TROPONINI in the last 168 hours. BNP (last 3 results) No results for input(s): PROBNP in the last 8760 hours. HbA1C: Recent Labs    09/30/19 0612  HGBA1C 4.9   CBG: Recent Labs  Lab 09/29/19 2124 09/30/19 0231 09/30/19 0719 09/30/19 1100 09/30/19 1620  GLUCAP 120* 149* 101* 210* 134*   Lipid Profile: No results for input(s): CHOL, HDL, LDLCALC, TRIG, CHOLHDL, LDLDIRECT in the last 72 hours. Thyroid Function Tests: No results for input(s): TSH, T4TOTAL, FREET4, T3FREE, THYROIDAB in the last 72 hours. Anemia Panel: No results for input(s): VITAMINB12, FOLATE, FERRITIN, TIBC, IRON, RETICCTPCT in the last 72 hours. Sepsis Labs: No results for input(s): PROCALCITON, LATICACIDVEN in the last 168 hours.  No results found for this or any previous visit (from the past 240 hour(s)).       Radiology Studies: No results found.      Scheduled Meds: . bumetanide  2 mg Oral BID  . busPIRone  10 mg Oral BID  . Chlorhexidine Gluconate Cloth  6 each Topical Daily  . insulin aspart  0-15 Units Subcutaneous TID WC  . insulin aspart  0-5 Units Subcutaneous QHS  . insulin glargine  35 Units Subcutaneous Daily  . lactulose  40 g Oral  QID  . levothyroxine  25 mcg Oral QAC breakfast  . magnesium oxide  400 mg Oral Daily  . pantoprazole  40 mg Oral QAC breakfast  . potassium chloride SA  40 mEq Oral Daily  . rifaximin  550 mg Oral BID  . rosuvastatin  10 mg Oral q1800  . sertraline  50 mg Oral Daily   Continuous Infusions:   LOS: 0 days    Time spent: 24mns    JKathie Dike MD [facility]   If 7PM-7AM, please contact night-coverage [email or web address]  09/30/2019, 9:27 PM

## 2019-10-01 DIAGNOSIS — K7290 Hepatic failure, unspecified without coma: Principal | ICD-10-CM

## 2019-10-01 DIAGNOSIS — D696 Thrombocytopenia, unspecified: Secondary | ICD-10-CM

## 2019-10-01 DIAGNOSIS — K7469 Other cirrhosis of liver: Secondary | ICD-10-CM

## 2019-10-01 DIAGNOSIS — E8770 Fluid overload, unspecified: Secondary | ICD-10-CM

## 2019-10-01 DIAGNOSIS — D649 Anemia, unspecified: Secondary | ICD-10-CM

## 2019-10-01 DIAGNOSIS — K7460 Unspecified cirrhosis of liver: Secondary | ICD-10-CM

## 2019-10-01 LAB — COMPREHENSIVE METABOLIC PANEL
ALT: 25 U/L (ref 0–44)
AST: 28 U/L (ref 15–41)
Albumin: 2 g/dL — ABNORMAL LOW (ref 3.5–5.0)
Alkaline Phosphatase: 78 U/L (ref 38–126)
Anion gap: 6 (ref 5–15)
BUN: 18 mg/dL (ref 8–23)
CO2: 24 mmol/L (ref 22–32)
Calcium: 7.9 mg/dL — ABNORMAL LOW (ref 8.9–10.3)
Chloride: 109 mmol/L (ref 98–111)
Creatinine, Ser: 0.99 mg/dL (ref 0.44–1.00)
GFR calc Af Amer: >60 mL/min (ref >60)
GFR calc non Af Amer: >60 mL/min (ref >60)
Glucose, Bld: 149 mg/dL — ABNORMAL HIGH (ref 70–99)
Potassium: 3.2 mmol/L — ABNORMAL LOW (ref 3.5–5.1)
Sodium: 139 mmol/L (ref 135–145)
Total Bilirubin: 2.4 mg/dL — ABNORMAL HIGH (ref 0.3–1.2)
Total Protein: 4.4 g/dL — ABNORMAL LOW (ref 6.5–8.1)

## 2019-10-01 LAB — GLUCOSE, CAPILLARY
Glucose-Capillary: 134 mg/dL — ABNORMAL HIGH (ref 70–99)
Glucose-Capillary: 175 mg/dL — ABNORMAL HIGH (ref 70–99)
Glucose-Capillary: 131 mg/dL — ABNORMAL HIGH (ref 70–99)
Glucose-Capillary: 133 mg/dL — ABNORMAL HIGH (ref 70–99)

## 2019-10-01 LAB — BASIC METABOLIC PANEL
Anion gap: 5 (ref 5–15)
BUN: 20 mg/dL (ref 8–23)
CO2: 22 mmol/L (ref 22–32)
Calcium: 7.5 mg/dL — ABNORMAL LOW (ref 8.9–10.3)
Chloride: 110 mmol/L (ref 98–111)
Creatinine, Ser: 0.95 mg/dL (ref 0.44–1.00)
GFR calc Af Amer: >60 mL/min (ref >60)
GFR calc non Af Amer: >60 mL/min (ref >60)
Glucose, Bld: 240 mg/dL — ABNORMAL HIGH (ref 70–99)
Potassium: 3.5 mmol/L (ref 3.5–5.1)
Sodium: 137 mmol/L (ref 135–145)

## 2019-10-01 LAB — PREPARE RBC (CROSSMATCH)

## 2019-10-01 LAB — CBC
HCT: 23.5 % — ABNORMAL LOW (ref 36.0–46.0)
Hemoglobin: 6.9 g/dL — CL (ref 12.0–15.0)
MCH: 26.3 pg (ref 26.0–34.0)
MCHC: 29.4 g/dL — ABNORMAL LOW (ref 30.0–36.0)
MCV: 89.7 fL (ref 80.0–100.0)
Platelets: 35 10*3/uL — ABNORMAL LOW (ref 150–400)
RBC: 2.62 MIL/uL — ABNORMAL LOW (ref 3.87–5.11)
RDW: 20.2 % — ABNORMAL HIGH (ref 11.5–15.5)
WBC: 2.1 10*3/uL — ABNORMAL LOW (ref 4.0–10.5)
nRBC: 0 % (ref 0.0–0.2)

## 2019-10-01 LAB — SODIUM, URINE, RANDOM: Sodium, Ur: <10 mmol/L

## 2019-10-01 MED ORDER — SPIRONOLACTONE 25 MG PO TABS
100.0000 mg | ORAL_TABLET | Freq: Every day | ORAL | Status: DC
  Administered 2019-10-02 – 2019-10-05 (×4): 100 mg via ORAL
  Filled 2019-10-01 (×4): qty 4

## 2019-10-01 MED ORDER — SODIUM CHLORIDE 0.9% IV SOLUTION: Freq: Once | INTRAVENOUS | Status: AC

## 2019-10-01 MED ORDER — ALBUMIN HUMAN 25 % IV SOLN
25.0000 g | Freq: Four times a day (QID) | INTRAVENOUS | Status: AC
  Administered 2019-10-01 – 2019-10-02 (×4): 25 g via INTRAVENOUS
  Filled 2019-10-01: qty 100
  Filled 2019-10-01 (×4): qty 50

## 2019-10-01 NOTE — Progress Notes (Addendum)
PROGRESS NOTE    Maria [name]  RXV:[id number] DOB: 05-17-1956 DOA: 09/29/2019 PCP: Glenda Chroman, MD    Brief Narrative:  64 year old female with a history of nonalcoholic liver cirrhosis, hepatic encephalopathy, esophageal varices, GAVE who has intermittently required transfusion of PRBCs, presents to the hospital with generalized weakness and low hemoglobin.  Patient was sent to the emergency room by her gastroenterologist with reported hemoglobin of 5.  On arrival to the emergency room, hemoglobin was noted to be low at 6.  She was admitted for further transfusion.  She is also noted to have volume overload and is being diuresed.   Assessment & Plan:   Active Problems:   Cirrhosis, nonalcoholic (HCC)   Diabetes mellitus ([place])   Thrombocytopenia ([place])   Esophageal varices in cirrhosis (HCC)   Acute on chronic right-sided congestive heart failure (HCC)   GAVE (gastric antral vascular ectasia)   Acute anemia   1. Acute on chronic anemia.  Hemoglobin of 6 on admission.  She was transfused 2 units of PRBC on admission with improvement of hemoglobin to 7.4.  Today, hemoglobin noted to be down to 6.9.  Will transfuse another unit of PRBC today.  She does have a history of GAVE and esophageal varices.  Seen by gastroenterology, felt to be related to slow GI bleed.  No overt bleeding noted, no indication for endoscopic evaluation at this time.  With her chronic blood loss, may be more appropriate to keep her hemoglobin above 8. 2. Acute on chronic right-sided congestive heart failure.  Patient was started on intravenous Lasix, but reports her urine output has been poor.  This has not been recorded.  She reports that she has responded better to Bumex in the past.  Will change Lasix to Bumex.  We will also add albumin infusions to aid with diuresis. 3. NASH liver cirrhosis with history of hepatic encephalopathy.  Followed at [facility] gastroenterology clinic.  She is status post TIPS procedure in  06/2018.  She is on Xifaxan and lactulose. 4. Diabetes.  She is continued on reduced dose of Lantus.  Continue on sliding scale insulin. 5. Recent asymptomatic COVID-19 infection.  She had positive test on 08/27/2019.  Since she has not had any symptoms, will not retest.  Regular precautions. 6. Thrombocytopenia.  Secondary to her underlying liver disease.  Continue to monitor   DVT prophylaxis: SCDs Code Status: Full code Family Communication: Discussed with husband at the bedside Disposition Plan: Discharge home once hemoglobin is stable and she is adequately diuresed   Consultants:     Procedures:     Antimicrobials:       Subjective: Feels the same as yesterday.  No chest pain.  No shortness of breath.  Objective: Vitals:   10/01/19 1427 10/01/19 1459 10/01/19 1712 10/01/19 2050  BP: (!) 112/40 (!) 112/42 (!) 117/47 (!) 122/45  Pulse: 72 71 71 68  Resp: 18 18 18    Temp: 98.4 F (36.9 C) 98.1 F (36.7 C) 98.4 F (36.9 C) 98.2 F (36.8 C)  TempSrc: Oral Oral Oral Oral  SpO2: 99% 98% 100% 100%  Weight:      Height:        Intake/Output Summary (Last 24 hours) at 10/01/2019 2057 Last data filed at 10/01/2019 1800 Gross per 24 hour  Intake 1037.5 ml  Output 200 ml  Net 837.5 ml   Filed Weights   09/29/19 1635 09/30/19 0500 10/01/19 0609  Weight: 89.4 kg 92.6 kg 93 kg  Examination:  General exam: Appears calm and comfortable  Respiratory system: Crackles at bases.  Respiratory effort normal. Cardiovascular system: S1 & S2 heard, RRR. No JVD, murmurs, rubs, gallops or clicks. 1+ pedal edema. Gastrointestinal system: Abdomen is nondistended, soft and nontender. No organomegaly or masses felt. Normal bowel sounds heard. Central nervous system: Alert and oriented. No focal neurological deficits. Extremities: 1+ pitting edema bilaterally Skin: No rashes, lesions or ulcers Psychiatry: Judgement and insight appear normal. Mood & affect appropriate.     Data  Reviewed: I have personally reviewed following labs and imaging studies  CBC: Recent Labs  Lab 09/29/19 1319 09/29/19 1718 09/30/19 0612 10/01/19 0617  WBC  --  2.2* 3.0* 2.1*  HGB 5.8* 6.0* 7.4* 6.9*  HCT 20.9* 21.5* 24.7* 23.5*  MCV  --  87.8 89.2 89.7  PLT  --  41* 43* 35*   Basic Metabolic Panel: Recent Labs  Lab 09/29/19 1718 09/30/19 0612 10/01/19 0617 10/01/19 1425  NA 140 136 139 137  K 3.2* 3.5 3.2* 3.5  CL 112* 109 109 110  CO2 23 24 24 22   GLUCOSE 190* 100* 149* 240*  BUN 17 17 18 20   CREATININE 0.91 1.02* 0.99 0.95  CALCIUM 7.8* 7.7* 7.9* 7.5*  MG 2.2  --   --   --    GFR: Estimated Creatinine Clearance: 66.1 mL/min (by C-G formula based on SCr of 0.95 mg/dL). Liver Function Tests: Recent Labs  Lab 09/29/19 1718 10/01/19 0617  AST 33 28  ALT 29 25  ALKPHOS 90 78  BILITOT 2.3* 2.4*  PROT 4.8* 4.4*  ALBUMIN 2.2* 2.0*   No results for input(s): LIPASE, AMYLASE in the last 168 hours. No results for input(s): AMMONIA in the last 168 hours. Coagulation Profile: Recent Labs  Lab 09/29/19 1718  INR 1.7*   Cardiac Enzymes: No results for input(s): CKTOTAL, CKMB, CKMBINDEX, TROPONINI in the last 168 hours. BNP (last 3 results) No results for input(s): PROBNP in the last 8760 hours. HbA1C: Recent Labs    09/30/19 0612  HGBA1C 4.9   CBG: Recent Labs  Lab 09/30/19 2221 10/01/19 0730 10/01/19 1146 10/01/19 1701 10/01/19 2052  GLUCAP 193* 134* 175* 131* 133*   Lipid Profile: No results for input(s): CHOL, HDL, LDLCALC, TRIG, CHOLHDL, LDLDIRECT in the last 72 hours. Thyroid Function Tests: No results for input(s): TSH, T4TOTAL, FREET4, T3FREE, THYROIDAB in the last 72 hours. Anemia Panel: No results for input(s): VITAMINB12, FOLATE, FERRITIN, TIBC, IRON, RETICCTPCT in the last 72 hours. Sepsis Labs: No results for input(s): PROCALCITON, LATICACIDVEN in the last 168 hours.  No results found for this or any previous visit (from the past 240  hour(s)).       Radiology Studies: No results found.      Scheduled Meds: . bumetanide  2 mg Oral BID  . busPIRone  10 mg Oral BID  . Chlorhexidine Gluconate Cloth  6 each Topical Daily  . insulin aspart  0-15 Units Subcutaneous TID WC  . insulin aspart  0-5 Units Subcutaneous QHS  . insulin glargine  35 Units Subcutaneous Daily  . lactulose  40 g Oral QID  . levothyroxine  25 mcg Oral QAC breakfast  . magnesium oxide  400 mg Oral Daily  . pantoprazole  40 mg Oral QAC breakfast  . potassium chloride SA  40 mEq Oral Daily  . rifaximin  550 mg Oral BID  . rosuvastatin  10 mg Oral q1800  . sertraline  50 mg Oral Daily  . [  START ON 10/02/2019] spironolactone  100 mg Oral Daily   Continuous Infusions:   LOS: 1 day    Time spent: 74mns    JKathie Dike MD [facility]   If 7PM-7AM, please contact night-coverage [email or web address]  10/01/2019, 8:57 PM

## 2019-10-01 NOTE — Consult Note (Signed)
Referring Provider: Kathie Dike, MD Primary Care Physician:  Glenda Chroman, MD Primary Gastroenterologist:  Dr. Laural Golden  Reason for Consultation:    Anemia in a patient with decompensated cirrhosis and history of gastric antral vascular ectasia.  HPI:   Patient is 64 year old Caucasian female with decompensated cirrhosis secondary to NASH who has chronic GI bleed secondary to gastric antral vascular ectasia.  APC therapy has not been successful.  She has been undergoing combination of APC therapy and banding at [facility].  Last procedure was on 09/16/2019.  She has been getting hemoglobin checked every week and has been receiving blood transfusion every 1 to 2 weeks.  Her hemoglobin last week was 7.4.  She has scheduled blood work 2 days ago and hemoglobin was 5.8 g.  Since hemoglobin was below 6 g she could not receive blood on an outpatient basis.  She was therefore admitted to hospitalist service.  Patient has received 2 units of PRBCs and to receive third unit today. Patient denies melena or rectal bleeding hematuria or vaginal bleeding.  She says she is having multiple loose stools.  They are green or brown.  She denies abdominal pain.  She says she has gained close to 20 pounds in the last few weeks.  I have been in touch with her over the phone and diuretic therapy was changed last week.  She feels weak.  She denies chest pain or shortness of breath.  She has not had any episodes of confusion since she was last hospitalized about a month ago for hepatic encephalopathy felt to be due to dehydration and constipation.  During that admission she also tested positive for Covid but she did not have any symptoms. Patient not sure when her next appointment is with Dr. Letta Pate of [facility] hepatology. She lives at home with her husband.  She states she has been doing daily exercises as recommended to her by physical therapist.  She walks some in the house.   Past Medical History:  Diagnosis Date   . Anemia   . CHF (congestive heart failure) ([place])   . Cirrhosis ([place])   . Depression   . Diabetes mellitus   . Esophageal bleed, non-variceal   . Eye hemorrhage    Behind left eye  . Fibromyalgia   . Hypercholesteremia   . Hypertension   . Hypothyroidism   . NAFLD (nonalcoholic fatty liver disease)   . Osteoarthrosis   . Osteoporosis   . PONV (postoperative nausea and vomiting)   . UTI (urinary tract infection) 11/12 end of the month   Patient feels that she passed a kidney stone at that time    Past Surgical History:  Procedure Laterality Date  . APPENDECTOMY  1980  . BILATERAL SALPINGOOPHORECTOMY    . CHOLECYSTECTOMY    . COLONOSCOPY  03/15/2011  . COLONOSCOPY N/A 03/24/2016   Procedure: COLONOSCOPY;  Surgeon: [name], MD;  [facility];  Service: Endoscopy;  Laterality: N/A;  855  . ESOPHAGEAL BANDING  04/24/2012   Procedure: ESOPHAGEAL BANDING;  Surgeon: [name], MD;  [facility];  Service: Endoscopy;  Laterality: N/A;  . Esophageal BANDING  08/03/2012   [facility] [place] [facility] [place]  09/24/2012   Procedure: ESOPHAGEAL BANDING;  Surgeon: [name], MD;  Location: [facility];  Service: Endoscopy;  Laterality: N/A;  Banding x 3  . ESOPHAGEAL BANDING N/A 01/29/2013   Procedure: ESOPHAGEAL BANDING;  Surgeon: [name], MD;  [facility];  Service: Endoscopy;  Laterality: N/A;  . ESOPHAGEAL BANDING N/A 06/19/2014   Procedure: ESOPHAGEAL BANDING;  Surgeon: [name], MD;  [facility];  Service: Endoscopy;  Laterality: N/A;  . ESOPHAGEAL BANDING N/A 01/05/2016   Procedure: ESOPHAGEAL BANDING;  Surgeon: [name], MD;  [facility];  Service: Endoscopy;  Laterality: N/A;  . ESOPHAGEAL BANDING N/A 08/03/2016   Procedure: ESOPHAGEAL BANDING;  Surgeon: [name], MD;  [facility];  Service: Endoscopy;  Laterality: N/A;  . ESOPHAGEAL BANDING N/A  05/02/2017   Procedure: ESOPHAGEAL BANDING;  Surgeon: [name], MD;  [facility];  Service: Endoscopy;  Laterality: N/A;  . ESOPHAGOGASTRODUODENOSCOPY  04/24/2012   Procedure: ESOPHAGOGASTRODUODENOSCOPY (EGD);  Surgeon: [name], MD;  [facility];  Service: Endoscopy;  Laterality: N/A;  300  . ESOPHAGOGASTRODUODENOSCOPY N/A 01/29/2013   Procedure: ESOPHAGOGASTRODUODENOSCOPY (EGD);  Surgeon: [name], MD;  [facility];  Service: Endoscopy;  Laterality: N/A;  1200  . ESOPHAGOGASTRODUODENOSCOPY N/A 05/22/2013   Procedure: ESOPHAGOGASTRODUODENOSCOPY (EGD);  Surgeon: [name], MD;  [facility];  Service: Endoscopy;  Laterality: N/A;  1:55  . ESOPHAGOGASTRODUODENOSCOPY N/A 12/31/2013   Procedure: ESOPHAGOGASTRODUODENOSCOPY (EGD);  Surgeon: [name], MD;  [facility];  Service: Endoscopy;  Laterality: N/A;  1200  . ESOPHAGOGASTRODUODENOSCOPY N/A 06/19/2014   Procedure: ESOPHAGOGASTRODUODENOSCOPY (EGD);  Surgeon: [name], MD;  [facility];  Service: Endoscopy;  Laterality: N/A;  1055  . ESOPHAGOGASTRODUODENOSCOPY N/A 01/15/2015   Procedure: ESOPHAGOGASTRODUODENOSCOPY (EGD);  Surgeon: [name], MD;  [facility];  Service: Endoscopy;  Laterality: N/A;  730 - moved to 9:45 - moved to 1250-Ann notified pt  . ESOPHAGOGASTRODUODENOSCOPY N/A 06/30/2015   Procedure: ESOPHAGOGASTRODUODENOSCOPY (EGD);  Surgeon: [name], MD;  [facility];  Service: Endoscopy;  Laterality: N/A;  1200  . ESOPHAGOGASTRODUODENOSCOPY N/A 01/05/2016   Procedure: ESOPHAGOGASTRODUODENOSCOPY (EGD);  Surgeon: [name], MD;  [facility];  Service: Endoscopy;  Laterality: N/A;  830  . ESOPHAGOGASTRODUODENOSCOPY N/A 08/03/2016   Procedure: ESOPHAGOGASTRODUODENOSCOPY (EGD);  Surgeon: [name], MD;  [facility];  Service: Endoscopy;  Laterality: N/A;  930  .  ESOPHAGOGASTRODUODENOSCOPY N/A 05/02/2017   Procedure: ESOPHAGOGASTRODUODENOSCOPY (EGD);  Surgeon: [name], MD;  [facility];  Service: Endoscopy;  Laterality: N/A;  12:00  . ESOPHAGOGASTRODUODENOSCOPY N/A 08/17/2017   Procedure: ESOPHAGOGASTRODUODENOSCOPY (EGD);  Surgeon: [name], MD;  [facility];  Service: Endoscopy;  Laterality: N/A;  8:15  . ESOPHAGOGASTRODUODENOSCOPY N/A 09/12/2017   Procedure: ESOPHAGOGASTRODUODENOSCOPY (EGD);  Surgeon: [name], MD;  [facility];  Service: Endoscopy;  Laterality: N/A;  1040  . ESOPHAGOGASTRODUODENOSCOPY N/A 10/10/2017   Procedure: ESOPHAGOGASTRODUODENOSCOPY (EGD);  Surgeon: [name], MD;  [facility];  Service: Endoscopy;  Laterality: N/A;  225  . ESOPHAGOGASTRODUODENOSCOPY N/A 11/09/2017   Procedure: ESOPHAGOGASTRODUODENOSCOPY (EGD);  Surgeon: [name], MD;  [facility];  Service: Endoscopy;  Laterality: N/A;  . ESOPHAGOGASTRODUODENOSCOPY (EGD) WITH PROPOFOL  09/24/2012   Procedure: ESOPHAGOGASTRODUODENOSCOPY (EGD) WITH PROPOFOL;  Surgeon: [name], MD;  [facility]: [facility];  Service: Endoscopy;  Laterality: N/A;  GE junction at 36  . ESOPHAGOGASTRODUODENOSCOPY (EGD) WITH PROPOFOL N/A 07/06/2017   Procedure: ESOPHAGOGASTRODUODENOSCOPY (EGD) WITH PROPOFOL;  Surgeon: [name],  MD;  [facility];  Service: Endoscopy;  Laterality: N/A;  . ESOPHAGOGASTRODUODENOSCOPY W/ BANDING  08/2010  . GIVENS CAPSULE STUDY N/A 07/05/2017   Procedure: GIVENS CAPSULE STUDY;  Surgeon: [name], MD;  [facility];  Service: Endoscopy;  Laterality: N/A;  . HOT HEMOSTASIS N/A 08/17/2017   Procedure: HOT HEMOSTASIS (ARGON PLASMA COAGULATION/BICAP);  Surgeon: [name], MD;  [facility];  Service: Endoscopy;  Laterality: N/A;  . HOT HEMOSTASIS  09/12/2017   Procedure: HOT HEMOSTASIS (ARGON PLASMA COAGULATION/BICAP);  Surgeon:  [name], MD;  [facility];  Service: Endoscopy;;  gastric  . HOT HEMOSTASIS  10/10/2017   Procedure: HOT HEMOSTASIS (ARGON PLASMA COAGULATION/BICAP);  Surgeon: [name], MD;  [facility];  Service: Endoscopy;;  . POLYPECTOMY  03/24/2016   Procedure: POLYPECTOMY;  Surgeon: [name], MD;  [facility];  Service: Endoscopy;;  sigmoid polyp  . PORTACATH PLACEMENT Right 06/02/2019   Procedure: INSERTION PORT-A-CATH;  Surgeon: Aviva Signs, MD;  Location: [facility];  Service: General;  Laterality: Right;  . RIGHT HEART CATH N/A 04/16/2017   Procedure: Right Heart Cath;  Surgeon: [name], MD;  Location: [place] [facility];  Service: Cardiovascular;  Laterality: N/A;  . RIGHT HEART CATH N/A 07/12/2017   Procedure: RIGHT HEART CATH;  Surgeon: [name], MD;  Location: [place] [facility];  Service: Cardiovascular;  Laterality: N/A;  . TONSILLECTOMY    . UPPER GASTROINTESTINAL ENDOSCOPY  03/15/2011   EGD ED BANDING/TCS  . UPPER GASTROINTESTINAL ENDOSCOPY  09/05/2010  . UPPER GASTROINTESTINAL ENDOSCOPY  08/11/2010  . VAGINAL HYSTERECTOMY      Prior to Admission medications   Medication Sig Start Date End Date Taking? Authorizing Provider  bumetanide (BUMEX) 2 MG tablet Take 2 mg by mouth 2 (two) times daily.  09/10/19  Yes Macgregor Aeschliman, Mechele Dawley, MD  busPIRone (BUSPAR) 10 MG tablet Take 10 mg by mouth 2 (two) times daily.   Yes [provider]  Cholecalciferol 25 MCG (1000 UT) tablet Take 1,000 Units by mouth every other day.    Yes [provider]  HUMALOG 100 UNIT/ML injection Inject 15-35 Units into the skin 3 (three) times daily with meals. Sliding scale insulin 100-150= 15 units 150-200= 20 units 250-300= 30 units 300-350= 35 units 07/01/18  Yes [provider]  Insulin Glargine (LANTUS SOLOSTAR) 100 UNIT/ML Solostar Pen Inject 10-55 Units into the skin See admin instructions. Inject 55 units subcutaneously  in the morning & 10 units subcutaneously at night. 03/06/19  Yes [provider]  lactulose (CHRONULAC) 10 GM/15ML solution Take 60 mLs (40 g total) by mouth 4 (four) times daily. If having watery stools >3 times per day, then reduce to 3 times daily. 09/02/19 12/01/19 Yes Enzo Bi, MD  levothyroxine (SYNTHROID, LEVOTHROID) 25 MCG tablet Take 25 mcg by mouth daily before breakfast.    Yes [provider]  lidocaine-prilocaine (EMLA) cream Apply 1 application topically daily as needed. 08/19/19  Yes [provider]  magnesium oxide (MAG-OX) 400 MG tablet Take 400 mg by mouth daily.    Yes [provider]  pantoprazole (PROTONIX) 40 MG tablet Take 1 tablet (40 mg total) by mouth daily before breakfast. 09/09/19  Yes Loy Mccartt, Mechele Dawley, MD  Pediatric Multivitamins-Iron (FLINTSTONES PLUS IRON PO) Take 2 tablets by mouth daily.   Yes [provider]  potassium chloride SA (KLOR-CON) 20 MEQ tablet Take 2  tablets (40 mEq total) by mouth daily. 09/02/19  Yes Enzo Bi, MD  rifaximin (XIFAXAN) 550 MG TABS tablet Take 1 tablet (550 mg total) by mouth 2 (two) times daily. 02/27/19  Yes Barton Dubois, MD  rosuvastatin (CRESTOR) 10 MG tablet Take 10 mg by mouth daily. 09/10/19  Yes [provider]  sertraline (ZOLOFT) 50 MG tablet Take 50 mg by mouth daily.  03/10/19  Yes [provider]  trimethoprim-polymyxin b (POLYTRIM) ophthalmic solution Place 1 drop into the left eye See admin instructions. Use 3 to 4 times daily for 2 days following monthly eye injection 05/06/18  Yes [provider]  zinc sulfate 220 (50 Zn) MG capsule Take 1 capsule (220 mg total) by mouth daily. 08/01/18  Yes Barton Dubois, MD  atenolol (TENORMIN) 25 MG tablet Take 0.5 tablets (12.5 mg total) by mouth daily. Patient not taking: Reported on 09/29/2019 06/02/19 06/01/20  Roxan Hockey, MD    Current Facility-Administered Medications  Medication Dose Route Frequency  Provider Last Rate Last Admin  . acetaminophen (TYLENOL) tablet 650 mg  650 mg Oral Q6H PRN Emokpae, Ejiroghene E, MD       Or  . acetaminophen (TYLENOL) suppository 650 mg  650 mg Rectal Q6H PRN Emokpae, Ejiroghene E, MD      . bumetanide (BUMEX) tablet 2 mg  2 mg Oral BID Kathie Dike, MD   2 mg at 10/01/19 0843  . busPIRone (BUSPAR) tablet 10 mg  10 mg Oral BID Emokpae, Ejiroghene E, MD   10 mg at 10/01/19 0912  . Chlorhexidine Gluconate Cloth 2 % PADS 6 each  6 each Topical Daily Kathie Dike, MD   6 each at 10/01/19 0914  . insulin aspart (novoLOG) injection 0-15 Units  0-15 Units Subcutaneous TID WC Emokpae, Ejiroghene E, MD   2 Units at 10/01/19 0843  . insulin aspart (novoLOG) injection 0-5 Units  0-5 Units Subcutaneous QHS Emokpae, Ejiroghene E, MD      . insulin glargine (LANTUS) injection 35 Units  35 Units Subcutaneous Daily Emokpae, Ejiroghene E, MD   35 Units at 10/01/19 0912  . lactulose (CHRONULAC) 10 GM/15ML solution 40 g  40 g Oral QID Emokpae, Ejiroghene E, MD   40 g at 09/30/19 2152  . levothyroxine (SYNTHROID) tablet 25 mcg  25 mcg Oral QAC breakfast Emokpae, Ejiroghene E, MD   25 mcg at 10/01/19 0615  . magnesium oxide (MAG-OX) tablet 400 mg  400 mg Oral Daily Emokpae, Ejiroghene E, MD   400 mg at 10/01/19 0900  . ondansetron (ZOFRAN) tablet 4 mg  4 mg Oral Q6H PRN Emokpae, Ejiroghene E, MD       Or  . ondansetron (ZOFRAN) injection 4 mg  4 mg Intravenous Q6H PRN Emokpae, Ejiroghene E, MD      . pantoprazole (PROTONIX) EC tablet 40 mg  40 mg Oral QAC breakfast Emokpae, Ejiroghene E, MD   40 mg at 10/01/19 0843  . polyethylene glycol (MIRALAX / GLYCOLAX) packet 17 g  17 g Oral Daily PRN Emokpae, Ejiroghene E, MD      . potassium chloride SA (KLOR-CON) CR tablet 40 mEq  40 mEq Oral Daily Emokpae, Ejiroghene E, MD   40 mEq at 10/01/19 0900  . rifaximin (XIFAXAN) tablet 550 mg  550 mg Oral BID Emokpae, Ejiroghene E, MD   550 mg at 10/01/19 0900  . rosuvastatin (CRESTOR)  tablet 10 mg  10 mg Oral q1800 Emokpae, Ejiroghene E, MD   10 mg at  09/30/19 1724  . sertraline (ZOLOFT) tablet 50 mg  50 mg Oral Daily Emokpae, Ejiroghene E, MD   50 mg at 10/01/19 0900    Allergies as of 09/29/2019 - Review Complete 09/29/2019  Allergen Reaction Noted  . Ferumoxytol Other (See Comments) 07/08/2017  . Tramadol hcl Other (See Comments) 07/21/2017    Family History  Problem Relation Age of Onset  . Lung cancer Mother   . Diabetes Father   . Diabetes Sister   . Hypertension Sister   . Hypothyroidism Sister   . Colon cancer Brother   . Diabetes Sister   . Hypothyroidism Brother   . Healthy Daughter   . Obesity Daughter   . Hypertension Daughter     Social History   Socioeconomic History  . Marital status: Married    Spouse name: Not on file  . Number of children: Not on file  . Years of education: Not on file  . Highest education level: Not on file  Occupational History  . Occupation: retired   Tobacco Use  . Smoking status: Never Smoker  . Smokeless tobacco: Never Used  Substance and Sexual Activity  . Alcohol use: No    Alcohol/week: 0.0 standard drinks  . Drug use: No  . Sexual activity: Not on file  Other Topics Concern  . Not on file  Social History Narrative   Lives with husband    Social Determinants of Health   Financial Resource Strain:   . Difficulty of Paying Living Expenses: Not on file  Food Insecurity:   . Worried About Charity fundraiser in the Last Year: Not on file  . Ran Out of Food in the Last Year: Not on file  Transportation Needs:   . Lack of Transportation (Medical): Not on file  . Lack of Transportation (Non-Medical): Not on file  Physical Activity:   . Days of Exercise per Week: Not on file  . Minutes of Exercise per Session: Not on file  Stress:   . Feeling of Stress : Not on file  Social Connections:   . Frequency of Communication with Friends and Family: Not on file  . Frequency of Social Gatherings with  Friends and Family: Not on file  . Attends Religious Services: Not on file  . Active Member of Clubs or Organizations: Not on file  . Attends Archivist Meetings: Not on file  . Marital Status: Not on file  Intimate Partner Violence:   . Fear of Current or Ex-Partner: Not on file  . Emotionally Abused: Not on file  . Physically Abused: Not on file  . Sexually Abused: Not on file    Review of Systems: See HPI, otherwise normal ROS  Physical Exam: Temp:  [98 F (36.7 C)-99.1 F (37.3 C)] 98.1 F (36.7 C) (01/13 0609) Pulse Rate:  [74-86] 74 (01/13 0609) Resp:  [17-20] 18 (01/13 0609) BP: (106-140)/(41-50) 109/43 (01/13 0609) SpO2:  [96 %-99 %] 96 % (01/13 0609) Weight:  [93 kg] 93 kg (01/13 0609) Last BM Date: 09/29/19  Patient is alert and in no acute distress. She has tremors but does not have asterixis. She is wearing a facial mask. Conjunctiva.  Sclerae nonicteric. Oropharyngeal mucosa is normal.  Dentition in satisfactory condition. Cardiac exam with regular rhythm normal S1 and S2.  Faint systolic murmur noted at left sternal border. Auscultation of lungs reveal vesicular breath sounds bilaterally. Abdomen is full.  Bowel sounds are normal.  On palpation is soft and nontender.  Spleen is nonpalpable.  Liver edge is indistinct. She has 2+ pitting edema involving both legs.  No clubbing or koilonychia noted.  Lab Results: Recent Labs    09/29/19 1718 09/30/19 0612 10/01/19 0617  WBC 2.2* 3.0* 2.1*  HGB 6.0* 7.4* 6.9*  HCT 21.5* 24.7* 23.5*  PLT 41* 43* 35*   BMET Recent Labs    09/29/19 1718 09/30/19 0612 10/01/19 0617  NA 140 136 139  K 3.2* 3.5 3.2*  CL 112* 109 109  CO2 23 24 24   GLUCOSE 190* 100* 149*  BUN 17 17 18   CREATININE 0.91 1.02* 0.99  CALCIUM 7.8* 7.7* 7.9*   LFT Recent Labs    10/01/19 0617  PROT 4.4*  ALBUMIN 2.0*  AST 28  ALT 25  ALKPHOS 78  BILITOT 2.4*   PT/INR Recent Labs    09/29/19 1718  LABPROT 20.3*  INR  1.7*    Assessment;  Patient is 64 year old Caucasian female who was cirrhosis secondary to NASH complicated by esophageal varices which have been eradicated with banding, hepatic encephalopathy, status post TIPS in October 2019, status post embolization of mesocaval varices last year who has chronic GI bleed secondary to gastric antral vascular ectasia and failed APC therapy.  She had EGD at [facility] on 09/16/2019 with banding and APC of gastric vascular ectasia.  She has chronic GI bleed requiring PRBC transfusion every 1 to 2 weeks.  Patient had routine blood test on 09/29/2019 and her hemoglobin was 5.8 g.  She was therefore hospitalized for transfusion.  She has received 2 units of PRBCs and hemoglobin remains low at 6.9 and she is going to receive another unit of PRBCs today. Suspect patient's acute on chronic anemia is due to slow GI blood loss.  No evidence of overt bleed.  I doubt that she is having hemolysis.  If so it must be insignificant as there is no rise in MCV or AST.  There is no indication for endoscopic intervention. Patient's transplant coordinator Ms. Neysa Bonito has been contacted with an update.  Fluid overload.  Patient has gained 6 pounds since admission.  Lower extremity edema is more pronounced.  She is back on her usual bumetanide dose.  She may benefit from IV diuretic and albumin infusion.  Her albumin is down to 2.  Decompensated cirrhosis.  Her MELD based on lab data from 2 days ago is 15.5. She has chronic thrombocytopenia secondary to cirrhosis splenomegaly.  She also has platelet clumping which makes her count even worse.  Hepatic encephalopathy.  She appears to be at baseline.  She is on lactulose and Xifaxan and having multiple bowel movements per day.   Recommendations;  Monitor for evidence of overt GI bleed. Hemoccult x1. Begin spironolactone 100 mg by mouth daily.  Will start tomorrow morning. Will check urinary sodium to help in managing her  diuretic therapy. Consider IV albumin and IV diuretic once transfusion completed.    LOS: 1 day   Aleyda Gindlesperger  10/01/2019, 11:44 AM

## 2019-10-01 NOTE — Progress Notes (Signed)
CRITICAL VALUE ALERT  Critical Value:  Hemoglobin 6.9  Date & Time Notied:  10-01-19 0715  Provider Notified: Roderic Palau, MD Orders Received/Actions taken: MD notified of patient hemoglobin.  MD is aware and stated he would place order for PRBC.

## 2019-10-02 LAB — COMPREHENSIVE METABOLIC PANEL
ALT: 23 U/L (ref 0–44)
AST: 28 U/L (ref 15–41)
Albumin: 2.4 g/dL — ABNORMAL LOW (ref 3.5–5.0)
Alkaline Phosphatase: 71 U/L (ref 38–126)
Anion gap: 5 (ref 5–15)
BUN: 22 mg/dL (ref 8–23)
CO2: 25 mmol/L (ref 22–32)
Calcium: 7.9 mg/dL — ABNORMAL LOW (ref 8.9–10.3)
Chloride: 109 mmol/L (ref 98–111)
Creatinine, Ser: 1.02 mg/dL — ABNORMAL HIGH (ref 0.44–1.00)
GFR calc Af Amer: >60 mL/min (ref >60)
GFR calc non Af Amer: 58 mL/min — ABNORMAL LOW (ref >60)
Glucose, Bld: 146 mg/dL — ABNORMAL HIGH (ref 70–99)
Potassium: 3.2 mmol/L — ABNORMAL LOW (ref 3.5–5.1)
Sodium: 139 mmol/L (ref 135–145)
Total Bilirubin: 2.7 mg/dL — ABNORMAL HIGH (ref 0.3–1.2)
Total Protein: 4.8 g/dL — ABNORMAL LOW (ref 6.5–8.1)

## 2019-10-02 LAB — PREPARE RBC (CROSSMATCH)

## 2019-10-02 LAB — CBC
HCT: 24.6 % — ABNORMAL LOW (ref 36.0–46.0)
Hemoglobin: 7.3 g/dL — ABNORMAL LOW (ref 12.0–15.0)
MCH: 26.4 pg (ref 26.0–34.0)
MCHC: 29.7 g/dL — ABNORMAL LOW (ref 30.0–36.0)
MCV: 88.8 fL (ref 80.0–100.0)
Platelets: 34 10*3/uL — ABNORMAL LOW (ref 150–400)
RBC: 2.77 MIL/uL — ABNORMAL LOW (ref 3.87–5.11)
RDW: 20 % — ABNORMAL HIGH (ref 11.5–15.5)
WBC: 1.8 10*3/uL — ABNORMAL LOW (ref 4.0–10.5)
nRBC: 0 % (ref 0.0–0.2)

## 2019-10-02 LAB — GLUCOSE, CAPILLARY
Glucose-Capillary: 132 mg/dL — ABNORMAL HIGH (ref 70–99)
Glucose-Capillary: 169 mg/dL — ABNORMAL HIGH (ref 70–99)
Glucose-Capillary: 179 mg/dL — ABNORMAL HIGH (ref 70–99)
Glucose-Capillary: 174 mg/dL — ABNORMAL HIGH (ref 70–99)

## 2019-10-02 LAB — PROTIME-INR
INR: 1.7 — ABNORMAL HIGH (ref 0.8–1.2)
Prothrombin Time: 20 seconds — ABNORMAL HIGH (ref 11.4–15.2)

## 2019-10-02 LAB — OCCULT BLOOD X 1 CARD TO LAB, STOOL: Fecal Occult Bld: POSITIVE — AB

## 2019-10-02 MED ORDER — SODIUM CHLORIDE 0.9% IV SOLUTION: Freq: Once | INTRAVENOUS | Status: AC

## 2019-10-02 MED ORDER — POTASSIUM CHLORIDE CRYS ER 20 MEQ PO TBCR
40.0000 meq | EXTENDED_RELEASE_TABLET | Freq: Two times a day (BID) | ORAL | Status: DC
  Administered 2019-10-02 – 2019-10-04 (×5): 40 meq via ORAL
  Filled 2019-10-02 (×5): qty 2

## 2019-10-02 MED ORDER — METOLAZONE 5 MG PO TABS
2.5000 mg | ORAL_TABLET | Freq: Once | ORAL | Status: AC
  Administered 2019-10-03: 2.5 mg via ORAL
  Filled 2019-10-02: qty 1

## 2019-10-02 NOTE — Plan of Care (Signed)
Plan of Care Reviewed

## 2019-10-02 NOTE — Progress Notes (Signed)
  Subjective:  Patient says she does not feel well.  She denies nausea vomiting or abdominal pain.  She also denies shortness of breath.  She says she had large stool was dark.  Was not melanic or red in color.  Objective:  Blood pressure 108/63, pulse 70, temperature 97.8 F (36.6 C), temperature source Oral, resp. rate 17, height _0  (1.626 m), weight 94.9 kg, SpO2 100 %. Patient is alert and in no acute distress. She appears pale. She does not have asterixis. Abdomen is full but soft and nontender with organomegaly or masses She has 2-3+ pitting edema involving both legs.  She also has pitting edema to left upper extremity.  Labs/studies Results:  CBC Latest Ref Rng & Units 10/02/2019 10/01/2019 09/30/2019  WBC 4.0 - 10.5 K/uL 1.8(L) 2.1(L) 3.0(L)  Hemoglobin 12.0 - 15.0 g/dL 7.3(L) 6.9(LL) 7.4(L)  Hematocrit 36.0 - 46.0 % 24.6(L) 23.5(L) 24.7(L)  Platelets 150 - 400 K/uL 34(L) 35(L) 43(L)    CMP Latest Ref Rng & Units 10/02/2019 10/01/2019 10/01/2019  Glucose 70 - 99 mg/dL 146(H) 240(H) 149(H)  BUN 8 - 23 mg/dL _1 Creatinine 0.44 - 1.00 mg/dL 1.02(H) 0.95 0.99  Sodium 135 - 145 mmol/L 139 137 139  Potassium 3.5 - 5.1 mmol/L 3.2(L) 3.5 3.2(L)  Chloride 98 - 111 mmol/L 109 110 109  CO2 22 - 32 mmol/L _2 Calcium 8.9 - 10.3 mg/dL 7.9(L) 7.5(L) 7.9(L)  Total Protein 6.5 - 8.1 g/dL 4.8(L) - 4.4(L)  Total Bilirubin 0.3 - 1.2 mg/dL 2.7(H) - 2.4(H)  Alkaline Phos 38 - 126 U/L 71 - 78  AST 15 - 41 U/L 28 - 28  ALT 0 - 44 U/L 23 - 25    Hepatic Function Latest Ref Rng & Units 10/02/2019 10/01/2019 09/29/2019  Total Protein 6.5 - 8.1 g/dL 4.8(L) 4.4(L) 4.8(L)  Albumin 3.5 - 5.0 g/dL 2.4(L) 2.0(L) 2.2(L)  AST 15 - 41 U/L 28 28 33  ALT 0 - 44 U/L _3 Alk Phosphatase 38 - 126 U/L 71 78 90  Total Bilirubin 0.3 - 1.2 mg/dL 2.7(H) 2.4(H) 2.3(H)  Bilirubin, Direct 0.0 - 0.2 mg/dL - - -    INR 1.7. Urinary sodium less than 10.  Assessment:  #1.  Acute on chronic  anemia.  She has been well documented to have chronic GI blood loss due to gastric antral vascular ectasia.  Endoscopic therapy so far has failed slow down the blood loss.  She is to receive fourth unit of PRBCs today.  Stool guaiac is still pending.  No overt GI bleed reported by the patient.  #2.  Fluid overload.  Clinically she does not have any ascites.  She remains with edema to both lower extremities as well as left upper extremity.  So far she has not responded to diuretic therapy.  Urinary sodium is less than 10.  Spironolactone added today.    #3.  Cirrhosis. MELD score based on today's labs is 16.337.  Patient is being [facility] at [facility] but so far not listed for transplant.  According to Dr. Francella Solian. Bernard's note she has been declined twice.  #4.  Hepatic encephalopathy.  She appears to be at baseline.  Recommendations  Metolazone 2.5 mg p.o. today. CBC and be met in a.m.

## 2019-10-02 NOTE — Progress Notes (Signed)
PROGRESS NOTE    Maria [name]  MOL:[id number] DOB: [birth year] DOA: 09/29/2019 PCP: Glenda Chroman, MD   Brief Narrative:  64 year old female with a history of nonalcoholic liver cirrhosis, hepatic encephalopathy, esophageal varices, GAVE who has intermittently required transfusion of PRBCs, presents to the hospital with generalized weakness and low hemoglobin.  Patient was sent to the emergency room by her gastroenterologist with reported hemoglobin of 5.  On arrival to the emergency room, hemoglobin was noted to be low at 6.  She was admitted for further transfusion.  She is also noted to have volume overload and is being diuresed.  1/14: Patient continues to feel weak and has had some dark bowel movements noted.  Her hemoglobin still remains 7.3.  No other acute events otherwise noted.  She is tolerating her diet.  She will require another 1 unit PRBC transfusion as she has not had a significant improvement after her last unit on 1/13.  Assessment & Plan:   Active Problems:   Cirrhosis, nonalcoholic (HCC)   Diabetes mellitus ([place])   Thrombocytopenia ([place])   Esophageal varices in cirrhosis (HCC)   Acute on chronic right-sided congestive heart failure (HCC)   GAVE (gastric antral vascular ectasia)   Acute anemia   1. Acute on chronic anemia.  Hemoglobin of 6 on admission.    She has been transfused a total of 3 units of PRBC since admission and hemoglobin still remains at 7.3.  We will plan to transfuse another unit on 1/14 and repeat CBC in a.m.  She does have a history of GAVE and esophageal varices.  Seen by gastroenterology, felt to be related to slow GI bleed.  No overt bleeding noted, no indication for endoscopic evaluation at this time.  With her chronic blood loss, may be more appropriate to keep her hemoglobin above 8.  Stool occult pending. 2. Acute on chronic right-sided congestive heart failure.  Patient was started on intravenous Lasix, but reports her urine output has been  poor.    For recordings noted.  Continue on Bumex as ordered as well as spironolactone which has started today. 3. NASH liver cirrhosis with history of hepatic encephalopathy.  Followed at [facility] gastroenterology clinic.  She is status post TIPS procedure in 06/2018.  She is on Xifaxan and lactulose.  Continue spironolactone as ordered by GI. 4. Diabetes.  She is continued on reduced dose of Lantus.  Continue on sliding scale insulin. 5. Recent asymptomatic COVID-19 infection.  She had positive test on 08/27/2019.  Since she has not had any symptoms, will not retest.  Regular precautions. 6. Thrombocytopenia-stable.  Secondary to her underlying liver disease.  Continue to monitor   DVT prophylaxis: SCDs Code Status: Full Family Communication: Discussed with husband at bedside Disposition Plan: Repeat 1 unit PRBC transfusion and await stool Hemoccult.  Continue potassium supplementation.  Continue diuresis as ordered.  Appreciate GI assessment.   Consultants:   GI  Procedures:   None  Antimicrobials:   None   Subjective: Patient seen and evaluated today with some dark bowel movements noted.  She continues to have significant weakness.  She denies any abdominal pain, nausea, or vomiting.  Objective: Vitals:   10/01/19 1459 10/01/19 1712 10/01/19 2050 10/02/19 0510  BP: (!) 112/42 (!) 117/47 (!) 122/45 (!) 120/56  Pulse: 71 71 68 76  Resp: 18 18 18 18   Temp: 98.1 F (36.7 C) 98.4 F (36.9 C) 98.2 F (36.8 C) 98 F (36.7 C)  TempSrc: Oral Oral Oral  Oral  SpO2: 98% 100% 100% 97%  Weight:    94.9 kg  Height:        Intake/Output Summary (Last 24 hours) at 10/02/2019 1335 Last data filed at 10/02/2019 1140 Gross per 24 hour  Intake 727.5 ml  Output 1200 ml  Net -472.5 ml   Filed Weights   09/30/19 0500 10/01/19 0609 10/02/19 0510  Weight: 92.6 kg 93 kg 94.9 kg    Examination:  General exam: Appears calm and comfortable  Respiratory system: Clear to auscultation.  Respiratory effort normal.  Currently on room air. Cardiovascular system: S1 & S2 heard, RRR. No JVD, murmurs, rubs, gallops or clicks. No pedal edema. Gastrointestinal system: Abdomen is nondistended, soft and nontender. No organomegaly or masses felt. Normal bowel sounds heard. Central nervous system: Alert and oriented. No focal neurological deficits. Extremities: Symmetric 5 x 5 power. Skin: No rashes, lesions or ulcers Psychiatry: Flat affect.    Data Reviewed: I have personally reviewed following labs and imaging studies  CBC: Recent Labs  Lab 09/29/19 1319 09/29/19 1718 09/30/19 0612 10/01/19 0617 10/02/19 0703  WBC  --  2.2* 3.0* 2.1* 1.8*  HGB 5.8* 6.0* 7.4* 6.9* 7.3*  HCT 20.9* 21.5* 24.7* 23.5* 24.6*  MCV  --  87.8 89.2 89.7 88.8  PLT  --  41* 43* 35* 34*   Basic Metabolic Panel: Recent Labs  Lab 09/29/19 1718 09/30/19 0612 10/01/19 0617 10/01/19 1425 10/02/19 0703  NA 140 136 139 137 139  K 3.2* 3.5 3.2* 3.5 3.2*  CL 112* 109 109 110 109  CO2 23 24 24 22 25   GLUCOSE 190* 100* 149* 240* 146*  BUN 17 17 18 20 22   CREATININE 0.91 1.02* 0.99 0.95 1.02*  CALCIUM 7.8* 7.7* 7.9* 7.5* 7.9*  MG 2.2  --   --   --   --    GFR: Estimated Creatinine Clearance: 62.3 mL/min (A) (by C-G formula based on SCr of 1.02 mg/dL (H)). Liver Function Tests: Recent Labs  Lab 09/29/19 1718 10/01/19 0617 10/02/19 0703  AST 33 28 28  ALT 29 25 23   ALKPHOS 90 78 71  BILITOT 2.3* 2.4* 2.7*  PROT 4.8* 4.4* 4.8*  ALBUMIN 2.2* 2.0* 2.4*   No results for input(s): LIPASE, AMYLASE in the last 168 hours. No results for input(s): AMMONIA in the last 168 hours. Coagulation Profile: Recent Labs  Lab 09/29/19 1718 10/02/19 0703  INR 1.7* 1.7*   Cardiac Enzymes: No results for input(s): CKTOTAL, CKMB, CKMBINDEX, TROPONINI in the last 168 hours. BNP (last 3 results) No results for input(s): PROBNP in the last 8760 hours. HbA1C: Recent Labs    09/30/19 0612  HGBA1C 4.9    CBG: Recent Labs  Lab 10/01/19 1146 10/01/19 1701 10/01/19 2052 10/02/19 0736 10/02/19 1100  GLUCAP 175* 131* 133* 132* 169*   Lipid Profile: No results for input(s): CHOL, HDL, LDLCALC, TRIG, CHOLHDL, LDLDIRECT in the last 72 hours. Thyroid Function Tests: No results for input(s): TSH, T4TOTAL, FREET4, T3FREE, THYROIDAB in the last 72 hours. Anemia Panel: No results for input(s): VITAMINB12, FOLATE, FERRITIN, TIBC, IRON, RETICCTPCT in the last 72 hours. Sepsis Labs: No results for input(s): PROCALCITON, LATICACIDVEN in the last 168 hours.  No results found for this or any previous visit (from the past 240 hour(s)).       Radiology Studies: No results found.      Scheduled Meds: . sodium chloride   Intravenous Once  . bumetanide  2 mg Oral BID  .  busPIRone  10 mg Oral BID  . Chlorhexidine Gluconate Cloth  6 each Topical Daily  . insulin aspart  0-15 Units Subcutaneous TID WC  . insulin aspart  0-5 Units Subcutaneous QHS  . insulin glargine  35 Units Subcutaneous Daily  . lactulose  40 g Oral QID  . levothyroxine  25 mcg Oral QAC breakfast  . magnesium oxide  400 mg Oral Daily  . pantoprazole  40 mg Oral QAC breakfast  . potassium chloride SA  40 mEq Oral BID  . rifaximin  550 mg Oral BID  . rosuvastatin  10 mg Oral q1800  . sertraline  50 mg Oral Daily  . spironolactone  100 mg Oral Daily   Continuous Infusions: . albumin human 25 g (10/02/19 0848)     LOS: 2 days    Time spent: 30 minutes    Shafer Swamy Darleen Crocker, DO [facility] Pager [phone]  If 7PM-7AM, please contact night-coverage [email or web address] Password [email or web address] 10/02/2019, 1:35 PM

## 2019-10-03 ENCOUNTER — Inpatient Hospital Stay (HOSPITAL_COMMUNITY): Payer: PPO

## 2019-10-03 ENCOUNTER — Encounter (INDEPENDENT_AMBULATORY_CARE_PROVIDER_SITE_OTHER): Payer: Self-pay | Admitting: *Deleted

## 2019-10-03 LAB — COMPREHENSIVE METABOLIC PANEL
ALT: 21 U/L (ref 0–44)
AST: 29 U/L (ref 15–41)
Albumin: 2.7 g/dL — ABNORMAL LOW (ref 3.5–5.0)
Alkaline Phosphatase: 67 U/L (ref 38–126)
Anion gap: 6 (ref 5–15)
BUN: 22 mg/dL (ref 8–23)
CO2: 23 mmol/L (ref 22–32)
Calcium: 8.8 mg/dL — ABNORMAL LOW (ref 8.9–10.3)
Chloride: 111 mmol/L (ref 98–111)
Creatinine, Ser: 0.86 mg/dL (ref 0.44–1.00)
GFR calc Af Amer: >60 mL/min (ref >60)
GFR calc non Af Amer: >60 mL/min (ref >60)
Glucose, Bld: 133 mg/dL — ABNORMAL HIGH (ref 70–99)
Potassium: 3.5 mmol/L (ref 3.5–5.1)
Sodium: 140 mmol/L (ref 135–145)
Total Bilirubin: 2.6 mg/dL — ABNORMAL HIGH (ref 0.3–1.2)
Total Protein: 5 g/dL — ABNORMAL LOW (ref 6.5–8.1)

## 2019-10-03 LAB — TYPE AND SCREEN
ABO/RH(D): O NEG
Antibody Screen: NEGATIVE
Unit division: 0
Unit division: 0
Unit division: 0
Unit division: 0

## 2019-10-03 LAB — CBC
HCT: 26 % — ABNORMAL LOW (ref 36.0–46.0)
Hemoglobin: 7.7 g/dL — ABNORMAL LOW (ref 12.0–15.0)
MCH: 26 pg (ref 26.0–34.0)
MCHC: 29.6 g/dL — ABNORMAL LOW (ref 30.0–36.0)
MCV: 87.8 fL (ref 80.0–100.0)
Platelets: 27 10*3/uL — CL (ref 150–400)
RBC: 2.96 MIL/uL — ABNORMAL LOW (ref 3.87–5.11)
RDW: 20.8 % — ABNORMAL HIGH (ref 11.5–15.5)
WBC: 2.2 10*3/uL — ABNORMAL LOW (ref 4.0–10.5)
nRBC: 0 % (ref 0.0–0.2)

## 2019-10-03 LAB — BPAM RBC
Blood Product Expiration Date: 202101302359
Blood Product Expiration Date: 202101302359
Blood Product Expiration Date: 202102072359
Blood Product Expiration Date: 202102112359
ISSUE DATE / TIME: 202101111928
ISSUE DATE / TIME: 202101120114
ISSUE DATE / TIME: 202101131435
ISSUE DATE / TIME: 202101141610
Unit Type and Rh: 9500
Unit Type and Rh: 9500
Unit Type and Rh: 9500
Unit Type and Rh: 9500

## 2019-10-03 LAB — GLUCOSE, CAPILLARY
Glucose-Capillary: 124 mg/dL — ABNORMAL HIGH (ref 70–99)
Glucose-Capillary: 182 mg/dL — ABNORMAL HIGH (ref 70–99)
Glucose-Capillary: 154 mg/dL — ABNORMAL HIGH (ref 70–99)
Glucose-Capillary: 151 mg/dL — ABNORMAL HIGH (ref 70–99)
Glucose-Capillary: 154 mg/dL — ABNORMAL HIGH (ref 70–99)

## 2019-10-03 LAB — MAGNESIUM: Magnesium: 1.9 mg/dL (ref 1.7–2.4)

## 2019-10-03 MED ORDER — BUMETANIDE 0.25 MG/ML IJ SOLN
2.0000 mg | Freq: Two times a day (BID) | INTRAMUSCULAR | Status: DC
  Administered 2019-10-03 – 2019-10-05 (×5): 2 mg via INTRAVENOUS
  Filled 2019-10-03 (×7): qty 8

## 2019-10-03 MED ORDER — SODIUM CHLORIDE 0.9% IV SOLUTION: Freq: Once | INTRAVENOUS | Status: AC

## 2019-10-03 MED ORDER — BUMETANIDE 0.25 MG/ML IJ SOLN
1.0000 mg | Freq: Two times a day (BID) | INTRAMUSCULAR | Status: DC
  Filled 2019-10-03 (×7): qty 4

## 2019-10-03 MED ORDER — METOLAZONE 5 MG PO TABS
5.0000 mg | ORAL_TABLET | Freq: Every day | ORAL | Status: DC
  Administered 2019-10-03 – 2019-10-05 (×3): 5 mg via ORAL
  Filled 2019-10-03 (×3): qty 1

## 2019-10-03 NOTE — Progress Notes (Signed)
PROGRESS NOTE    Maria [name]  ENM:[id number] DOB: 07/25/1956 DOA: 09/29/2019 PCP: Glenda Chroman, MD   Brief Narrative:  64 year old female with a history of nonalcoholic liver cirrhosis, hepatic encephalopathy, esophageal varices, GAVE who has intermittently required transfusion of PRBCs, presents to the hospital with generalized weakness and low hemoglobin. Patient was sent to the emergency room by her gastroenterologist with reported hemoglobin of 5. On arrival to the emergency room, hemoglobin was noted to be low at 6. She was admitted for further transfusion. She is also noted to have volume overload and is being diuresed.  1/14: Patient continues to feel weak and has had some dark bowel movements noted.  Her hemoglobin still remains 7.3.  No other acute events otherwise noted.  She is tolerating her diet.  She will require another 1 unit PRBC transfusion as she has not had a significant improvement after her last unit on 1/13.  1/15: Patient continues to feel weak and has not had sufficient diuresis even with added metolazone yesterday evening.  We will switch to Bumex IV and increased dose of metolazone today.  She is noted to have significant thrombocytopenia in the setting of active bleeding with positive stool occult noted.  Will transfuse 1 unit of platelets.  Hemoglobin has improved to 7.7.  Continue monitoring in a.m.  Appreciate GI recommendations.  Assessment & Plan:   Active Problems:   Cirrhosis, nonalcoholic (HCC)   Diabetes mellitus ([place])   Thrombocytopenia ([place])   Esophageal varices in cirrhosis (HCC)   Acute on chronic right-sided congestive heart failure (HCC)   GAVE (gastric antral vascular ectasia)   Acute anemia  1. Acute on chronic anemia. Hemoglobin of 6 on admission.   She has been transfused a total of 4 units of PRBC since admission and hemoglobin still remains at 7.7.    Transfuse platelets for today and repeat CBC in a.m. She does have a history of  GAVE and esophageal varices.Seen by gastroenterology, felt to be related to slow GI bleed. No overt bleeding noted, no indication for endoscopic evaluation at this time. With her chronic blood loss, may be more appropriate to keep her hemoglobin above 8.  Stool occult noted to be positive.  Appreciate further GI recommendations. 2. Acute on chronic right-sided congestive heart failure.  This appears persistent with no change in weight or increased urine output noted as of yet.  We will plan to switch oral Bumex to IV and started on higher dose of metolazone with 5 mg daily today.  Continue on spironolactone as prior.  Monitor strict I's and O's and daily weights.  Repeat chest x-ray today. 3. NASH liver cirrhosis with history of hepatic encephalopathy. Followed at [facility] gastroenterology clinic. She is status post TIPS procedure in 06/2018. She is on Xifaxan and lactulose.  Continue spironolactone as ordered by GI. 4. Diabetes-stable. She is continued on reduced dose of Lantus. Continue on sliding scale insulin. 5. Recent asymptomatic COVID-19 infection. She had positive test on 08/27/2019. Since she has not had any symptoms, will not retest. Regular precautions. 6. Thrombocytopenia-worsening. Secondary to her underlying liver disease along with 4 unit PRBC transfusion.  She is noted to have positive stool occult with active bleeding, thus necessitating blood transfusion.  Plan to transfuse 1 unit of platelets today and monitor repeat CBC in a.m.  DVT prophylaxis: SCDs Code Status: Full Family Communication: Discussed with husband on phone Disposition Plan:  Plan for platelet transfusion today and repeat CBC in a.m.  Work  on aggressive diuresis.  Chest x-ray today.   Consultants:   GI  Procedures:   None  Antimicrobials:   None  Subjective: Patient seen and evaluated today with no new acute complaints or concerns. No acute concerns or events noted overnight.  She continues to  have ongoing weakness.  No further bowel movements noted since yesterday.  Objective: Vitals:   10/02/19 1940 10/02/19 2119 10/03/19 0500 10/03/19 0524  BP: 115/62 (!) 128/51  (!) 122/41  Pulse:  70  75  Resp: 18 18  18   Temp: 98 F (36.7 C) 98.7 F (37.1 C)    TempSrc:  Oral    SpO2: 98% 97%  100%  Weight:   94.8 kg   Height:        Intake/Output Summary (Last 24 hours) at 10/03/2019 1053 Last data filed at 10/03/2019 0900 Gross per 24 hour  Intake 877.3 ml  Output 450 ml  Net 427.3 ml   Filed Weights   10/01/19 0609 10/02/19 0510 10/03/19 0500  Weight: 93 kg 94.9 kg 94.8 kg    Examination:  General exam: Appears calm and comfortable, appears weak Respiratory system: Clear to auscultation. Respiratory effort normal.  Currently on room air. Cardiovascular system: S1 & S2 heard, RRR. No JVD, murmurs, rubs, gallops or clicks. No pedal edema. Gastrointestinal system: Abdomen is nondistended, soft and nontender. No organomegaly or masses felt. Normal bowel sounds heard. Central nervous system: Alert and oriented. No focal neurological deficits. Extremities: Symmetric 5 x 5 power. Skin: No rashes, lesions or ulcers Psychiatry: Flat affect    Data Reviewed: I have personally reviewed following labs and imaging studies  CBC: Recent Labs  Lab 09/29/19 1718 09/30/19 0612 10/01/19 0617 10/02/19 0703 10/03/19 0605  WBC 2.2* 3.0* 2.1* 1.8* 2.2*  HGB 6.0* 7.4* 6.9* 7.3* 7.7*  HCT 21.5* 24.7* 23.5* 24.6* 26.0*  MCV 87.8 89.2 89.7 88.8 87.8  PLT 41* 43* 35* 34* 27*   Basic Metabolic Panel: Recent Labs  Lab 09/29/19 1718 09/29/19 1718 09/30/19 0612 10/01/19 0617 10/01/19 1425 10/02/19 0703 10/03/19 0605  NA 140   < > 136 139 137 139 140  K 3.2*   < > 3.5 3.2* 3.5 3.2* 3.5  CL 112*   < > 109 109 110 109 111  CO2 23   < > 24 24 22 25 23   GLUCOSE 190*   < > 100* 149* 240* 146* 133*  BUN 17   < > 17 18 20 22 22   CREATININE 0.91   < > 1.02* 0.99 0.95 1.02* 0.86    CALCIUM 7.8*   < > 7.7* 7.9* 7.5* 7.9* 8.8*  MG 2.2  --   --   --   --   --  1.9   < > = values in this interval not displayed.   GFR: Estimated Creatinine Clearance: 73.8 mL/min (by C-G formula based on SCr of 0.86 mg/dL). Liver Function Tests: Recent Labs  Lab 09/29/19 1718 10/01/19 0617 10/02/19 0703 10/03/19 0605  AST 33 28 28 29   ALT 29 25 23 21   ALKPHOS 90 78 71 67  BILITOT 2.3* 2.4* 2.7* 2.6*  PROT 4.8* 4.4* 4.8* 5.0*  ALBUMIN 2.2* 2.0* 2.4* 2.7*   No results for input(s): LIPASE, AMYLASE in the last 168 hours. No results for input(s): AMMONIA in the last 168 hours. Coagulation Profile: Recent Labs  Lab 09/29/19 1718 10/02/19 0703  INR 1.7* 1.7*   Cardiac Enzymes: No results for input(s): CKTOTAL, CKMB, CKMBINDEX,  TROPONINI in the last 168 hours. BNP (last 3 results) No results for input(s): PROBNP in the last 8760 hours. HbA1C: No results for input(s): HGBA1C in the last 72 hours. CBG: Recent Labs  Lab 10/02/19 0736 10/02/19 1100 10/02/19 1651 10/02/19 2120 10/03/19 0805  GLUCAP 132* 169* 179* 174* 124*   Lipid Profile: No results for input(s): CHOL, HDL, LDLCALC, TRIG, CHOLHDL, LDLDIRECT in the last 72 hours. Thyroid Function Tests: No results for input(s): TSH, T4TOTAL, FREET4, T3FREE, THYROIDAB in the last 72 hours. Anemia Panel: No results for input(s): VITAMINB12, FOLATE, FERRITIN, TIBC, IRON, RETICCTPCT in the last 72 hours. Sepsis Labs: No results for input(s): PROCALCITON, LATICACIDVEN in the last 168 hours.  No results found for this or any previous visit (from the past 240 hour(s)).       Radiology Studies: No results found.      Scheduled Meds: . sodium chloride   Intravenous Once  . bumetanide (BUMEX) IV  2 mg Intravenous Q12H  . busPIRone  10 mg Oral BID  . Chlorhexidine Gluconate Cloth  6 each Topical Daily  . insulin aspart  0-15 Units Subcutaneous TID WC  . insulin aspart  0-5 Units Subcutaneous QHS  . insulin  glargine  35 Units Subcutaneous Daily  . lactulose  40 g Oral QID  . levothyroxine  25 mcg Oral QAC breakfast  . magnesium oxide  400 mg Oral Daily  . metolazone  5 mg Oral Daily  . pantoprazole  40 mg Oral QAC breakfast  . potassium chloride SA  40 mEq Oral BID  . rifaximin  550 mg Oral BID  . rosuvastatin  10 mg Oral q1800  . sertraline  50 mg Oral Daily  . spironolactone  100 mg Oral Daily   Continuous Infusions:   LOS: 3 days    Time spent: 30 minutes    Pegeen Stiger Darleen Crocker, DO [facility] Pager [phone]  If 7PM-7AM, please contact night-coverage [email or web address] Password [facility] 10/03/2019, 10:53 AM

## 2019-10-03 NOTE — Progress Notes (Signed)
CRITICAL VALUE ALERT  Critical Value:  Platelets 27  Date & Time Notied:  10/03/2019 0730  Provider Notified: Dr. Manuella Ghazi  Orders Received/Actions taken: In progress

## 2019-10-03 NOTE — Care Management Important Message (Signed)
Important Message  Patient Details  Name: Maria [name] MRN: [id number] Date of Birth: Dec 12, 1955   Medicare Important Message Given:  Yes     Tommy Medal 10/03/2019, 12:27 PM

## 2019-10-03 NOTE — Progress Notes (Signed)
Subjective:  Patient states she is passing more urine today than she had been before.  She had a bowel movement this morning.  She does not report to be black or red.  She denies abdominal pain.  She says she is just scared because of her illness.  She also showed me the booklet that she is using for exercises at home. Patient's husband Ronalee Belts is at bedside and states he talk with Dr. Letta Pate who is recommended that patient be transferred to [facility].  Objective: Blood pressure (!) 119/50, pulse 82, temperature 98.7 F (37.1 C), temperature source Oral, resp. rate 14, height _0  (1.626 m), weight 94.8 kg, SpO2 97 %. Patient is alert. When I walked in the room she was talking to someone on the phone. She has mild tremors but no asterixis. Conjunctivae is pale and sclerae nonicteric. Cardiac exam with regular rhythm normal S1 and S2.  Systolic murmur at left sternal border is unchanged.  It is faint. Auscultation lungs reveal vesicular breath sounds bilaterally. Abdomen is full.  Is soft and nontender with organomegaly or masses.   She has at least 2+ pitting edema involving both legs.  Left upper extremity edema has decreased since yesterday.  Urine output last 24 hours 1550 mL.    Labs/studies Results:  CBC Latest Ref Rng & Units 10/03/2019 10/02/2019 10/01/2019  WBC 4.0 - 10.5 K/uL 2.2(L) 1.8(L) 2.1(L)  Hemoglobin 12.0 - 15.0 g/dL 7.7(L) 7.3(L) 6.9(LL)  Hematocrit 36.0 - 46.0 % 26.0(L) 24.6(L) 23.5(L)  Platelets 150 - 400 K/uL 27(LL) 34(L) 35(L)    CMP Latest Ref Rng & Units 10/03/2019 10/02/2019 10/01/2019  Glucose 70 - 99 mg/dL 133(H) 146(H) 240(H)  BUN 8 - 23 mg/dL _1 Creatinine 0.44 - 1.00 mg/dL 0.86 1.02(H) 0.95  Sodium 135 - 145 mmol/L 140 139 137  Potassium 3.5 - 5.1 mmol/L 3.5 3.2(L) 3.5  Chloride 98 - 111 mmol/L 111 109 110  CO2 22 - 32 mmol/L _2 Calcium 8.9 - 10.3 mg/dL 8.8(L) 7.9(L) 7.5(L)  Total Protein 6.5 - 8.1 g/dL 5.0(L) 4.8(L) -  Total  Bilirubin 0.3 - 1.2 mg/dL 2.6(H) 2.7(H) -  Alkaline Phos 38 - 126 U/L 67 71 -  AST 15 - 41 U/L 29 28 -  ALT 0 - 44 U/L 21 23 -    Hepatic Function Latest Ref Rng & Units 10/03/2019 10/02/2019 10/01/2019  Total Protein 6.5 - 8.1 g/dL 5.0(L) 4.8(L) 4.4(L)  Albumin 3.5 - 5.0 g/dL 2.7(L) 2.4(L) 2.0(L)  AST 15 - 41 U/L _3 ALT 0 - 44 U/L _4 Alk Phosphatase 38 - 126 U/L 67 71 78  Total Bilirubin 0.3 - 1.2 mg/dL 2.6(H) 2.7(H) 2.4(H)  Bilirubin, Direct 0.0 - 0.2 mg/dL - - -     Assessment:  #1.  Acute on chronic anemia.  She has well-documented GI bleed secondary to gastric antral vascular ectasia.  She has undergone multiple sessions of APC ablation and lately has been getting APC plus banding but need for blood transfusion has remained.  Her stool is guaiac positive.  She has been in the hospital for over 4 days and melena or rectal bleeding not documented.  There has been no rise in bilirubin or BUN to suggest any frequent GI bleed. Patient's husband Ronalee Belts has been in touch with Dr. Letta Pate, patient's transplant hepatologist at [facility] who wants patient to be transferred to [facility].  Dr. Manuella Ghazi  is arranging.  #2.  Decompensated cirrhosis.  Etiology is [name].  MELD score yesterday was 16.37.  She is being followed at [facility] and not yet listed for transplant.  #3.  Fluid overload.  Most of the fluid is third spacing in her extremities.  Exam does not suggest significant ascites.  Ultrasound in September 2020 at [facility] revealed trace ascites in left lower quadrant.  She is starting to put out more urine.  She has received IV albumin and she is back on bumetanide was also given single dose of metolazone yesterday.  #4.  Thrombocytopenia.  Thrombocytopenia secondary to cirrhosis and splenomegaly.  She also has platelet clumping which always underestimates her platelet count.  Dr. Manuella Ghazi has ordered her to receive platelet transfusion today.  #5. hepatic encephalopathy.   She appears to be at her baseline.  She is on lactulose and Xifaxan.   Recommendations  Agree with plans for transfer to [facility] where she can be reevaluated for chronic GI bleed and cirrhosis.

## 2019-10-03 NOTE — Unmapped (Signed)
Spoke to patient's daughter regarding admission to [facility] for active GI bleeding.  She has had 4 units of blood since admission with little improvement.      She is scheduled for a EGD in [place] at [facility] but her family thinks it may need to be moved up.    Gave daughter and husband phone number and instructions on how to get patient transferred to [facility].

## 2019-10-04 LAB — PREPARE PLATELET PHERESIS: Unit division: 0

## 2019-10-04 LAB — COMPREHENSIVE METABOLIC PANEL
ALT: 23 U/L (ref 0–44)
AST: 31 U/L (ref 15–41)
Albumin: 2.9 g/dL — ABNORMAL LOW (ref 3.5–5.0)
Alkaline Phosphatase: 71 U/L (ref 38–126)
Anion gap: 9 (ref 5–15)
BUN: 25 mg/dL — ABNORMAL HIGH (ref 8–23)
CO2: 27 mmol/L (ref 22–32)
Calcium: 9.6 mg/dL (ref 8.9–10.3)
Chloride: 105 mmol/L (ref 98–111)
Creatinine, Ser: 0.98 mg/dL (ref 0.44–1.00)
GFR calc Af Amer: >60 mL/min (ref >60)
GFR calc non Af Amer: >60 mL/min (ref >60)
Glucose, Bld: 129 mg/dL — ABNORMAL HIGH (ref 70–99)
Potassium: 3.3 mmol/L — ABNORMAL LOW (ref 3.5–5.1)
Sodium: 141 mmol/L (ref 135–145)
Total Bilirubin: 2.8 mg/dL — ABNORMAL HIGH (ref 0.3–1.2)
Total Protein: 5.1 g/dL — ABNORMAL LOW (ref 6.5–8.1)

## 2019-10-04 LAB — GLUCOSE, CAPILLARY
Glucose-Capillary: 131 mg/dL — ABNORMAL HIGH (ref 70–99)
Glucose-Capillary: 233 mg/dL — ABNORMAL HIGH (ref 70–99)
Glucose-Capillary: 163 mg/dL — ABNORMAL HIGH (ref 70–99)
Glucose-Capillary: 227 mg/dL — ABNORMAL HIGH (ref 70–99)

## 2019-10-04 LAB — CBC
HCT: 27.3 % — ABNORMAL LOW (ref 36.0–46.0)
Hemoglobin: 8.1 g/dL — ABNORMAL LOW (ref 12.0–15.0)
MCH: 26 pg (ref 26.0–34.0)
MCHC: 29.7 g/dL — ABNORMAL LOW (ref 30.0–36.0)
MCV: 87.5 fL (ref 80.0–100.0)
Platelets: 36 10*3/uL — ABNORMAL LOW (ref 150–400)
RBC: 3.12 MIL/uL — ABNORMAL LOW (ref 3.87–5.11)
RDW: 21.4 % — ABNORMAL HIGH (ref 11.5–15.5)
WBC: 2.5 10*3/uL — ABNORMAL LOW (ref 4.0–10.5)
nRBC: 0 % (ref 0.0–0.2)

## 2019-10-04 LAB — BPAM PLATELET PHERESIS
Blood Product Expiration Date: 202101172359
ISSUE DATE / TIME: 202101151425
Unit Type and Rh: 6200

## 2019-10-04 MED ORDER — POTASSIUM CHLORIDE CRYS ER 20 MEQ PO TBCR
40.0000 meq | EXTENDED_RELEASE_TABLET | Freq: Three times a day (TID) | ORAL | Status: DC
  Administered 2019-10-04 – 2019-10-05 (×3): 40 meq via ORAL
  Filled 2019-10-04: qty 2
  Filled 2019-10-04: qty 4
  Filled 2019-10-04: qty 2

## 2019-10-04 NOTE — Progress Notes (Signed)
Discussed with Dr. Laural Golden at length this morning. Patient has no complaints today.   Vital signs in last 24 hours: Temp:  [98.4 F (36.9 C)-99.3 F (37.4 C)] 98.4 F (36.9 C) (01/16 0510) Pulse Rate:  [74-82] 75 (01/16 0510) Resp:  [14-16] 15 (01/16 0510) BP: (116-128)/(36-54) 118/36 (01/16 0510) SpO2:  [96 %-99 %] 96 % (01/16 0510) Weight:  [98.7 kg] 98.7 kg (01/16 0500) Last BM Date: 10/03/19 General:   Awake and pleasant.  Slow to respond to questions.  Not sure of the day of the week.  No asterixis. Abdomen:  Soft, nontender and nondistended.  Normal bowel sounds, without guarding, and without rebound.  No mass or organomegaly. Extremities: 1+ LE edema. Intake/Output from previous day: 01/15 0701 - 01/16 0700 In: 1814.7 [P.O.:480; I.V.:1014.7; Blood:320] Out: 3570 [Urine:1850] Intake/Output this shift: Total I/O In: 240 [P.O.:240] Out: -   Lab Results: Recent Labs    10/02/19 0703 10/03/19 0605 10/04/19 0835  WBC 1.8* 2.2* 2.5*  HGB 7.3* 7.7* 8.1*  HCT 24.6* 26.0* 27.3*  PLT 34* 27* 36*   BMET Recent Labs    10/02/19 0703 10/03/19 0605 10/04/19 0835  NA 139 140 141  K 3.2* 3.5 3.3*  CL 109 111 105  CO2 25 23 27   GLUCOSE 146* 133* 129*  BUN 22 22 25*  CREATININE 1.02* 0.86 0.98  CALCIUM 7.9* 8.8* 9.6   LFT Recent Labs    10/04/19 0835  PROT 5.1*  ALBUMIN 2.9*  AST 31  ALT 23  ALKPHOS 71  BILITOT 2.8*   PT/INR Recent Labs    10/02/19 0703  LABPROT 20.0*  INR 1.7*   Hepatitis Panel No results for input(s): HEPBSAG, HCVAB, HEPAIGM, HEPBIGM in the last 72 hours. C-Diff No results for input(s): CDIFFTOX in the last 72 hours.  Studies/Results: DG CHEST PORT 1 VIEW  Result Date: 10/03/2019 CLINICAL DATA:  Shortness of breath. EXAM: PORTABLE CHEST 1 VIEW COMPARISON:  08/25/2019. FINDINGS: PowerPort catheter noted with tip over SVC. Stable cardiomegaly. Mild infiltrate right upper lung cannot be excluded. No pleural effusion or pneumothorax.  IMPRESSION: 1.  PowerPort catheter with tip over SVC. 2.  Stable cardiomegaly. 3.  Mild infiltrate right upper lung cannot be excluded. Electronically Signed   By: Marcello Moores  Register   On: 10/03/2019 12:52     Impression: Decompensated end-stage [name] cirrhosis with recurrent GI bleeding felt to be due to GAVE.  She is pancytopenic.  Hemoglobin 8.1 this morning.  She does have some persisting encephalopathy..  Recommendations: Continue supportive measures; would maintain a somewhat restrictive stance on transfusion in the setting of cirrhosis and fluid overload.  It looks like she will end up going home and then to [facility] as an outpatient.  It is somewhat disappointing she has not been listed for transplantation as of yet.  Hopefully, [facility] will take a hard look at her candidacy at her next visit. Continue lactulose and Xifaxan without fail.  Goal is 3-4 semiformed stools daily with titration of lactulose.

## 2019-10-04 NOTE — Plan of Care (Signed)

## 2019-10-04 NOTE — Progress Notes (Signed)
PROGRESS NOTE    Maria [name]  CWU:[id number] DOB: Mar 05, 1956 DOA: 09/29/2019 PCP: Glenda Chroman, MD   Brief Narrative:  64 year old female with a history of nonalcoholic liver cirrhosis, hepatic encephalopathy, esophageal varices, GAVE who has intermittently required transfusion of PRBCs, presents to the hospital with generalized weakness and low hemoglobin. Patient was sent to the emergency room by her gastroenterologist with reported hemoglobin of 5. On arrival to the emergency room, hemoglobin was noted to be low at 6. She was admitted for further transfusion. She is also noted to have volume overload and is being diuresed.  1/14: Patient continues to feel weak and has had some dark bowel movements noted. Her hemoglobin still remains 7.3. No other acute events otherwise noted. She is tolerating her diet. She will require another 1 unit PRBC transfusion as she has not had a significant improvement after her last unit on 1/13.  1/15: Patient continues to feel weak and has not had sufficient diuresis even with added metolazone yesterday evening.  We will switch to Bumex IV and increased dose of metolazone today.  She is noted to have significant thrombocytopenia in the setting of active bleeding with positive stool occult noted.  Will transfuse 1 unit of platelets.  Hemoglobin has improved to 7.7.  Continue monitoring in a.m.  Appreciate GI recommendations.  1/16: Patient continues to have feelings of weakness, but hemoglobin levels have stabilized at 8.1 this morning with some improvement in platelet counts.  She continues to have some ongoing diuresis which is not likely being measured accurately.  Continue ongoing diuresis and anticipate discharge in the next 1-2 days if stable.  Assessment & Plan:   Active Problems:   Cirrhosis, nonalcoholic (HCC)   Diabetes mellitus ([place])   Thrombocytopenia ([place])   Esophageal varices in cirrhosis (HCC)   Acute on chronic right-sided  congestive heart failure (HCC)   GAVE (gastric antral vascular ectasia)   Acute anemia   1. Acute on chronic anemia. Hemoglobin of 6 on admission.She has been transfused a total of 4 units of PRBC since admission and hemoglobin has improved to 8.1.     Platelet counts are slightly improved.  She does have a history of GAVE and esophageal varices.Seen by gastroenterology, felt to be related to slow GI bleed. No overt bleeding noted, no indication for endoscopic evaluation at this time. With her chronic blood loss, may be more appropriate to keep her hemoglobin above 8.Stool occult noted to be positive.  Appreciate further GI recommendations.  Attempted transfer to [facility] yesterday, but no beds available.  Spoke with Dr. Freddrick March with hepatology recommends outpatient close follow-up with patient's gastroenterologist Dr. Margaretha Seeds. 2. Acute on chronic right-sided congestive heart failure.  This appears persistent with no change in weight or increased urine output noted as of yet.  We will plan to switch oral Bumex to IV and started on higher dose of metolazone with 5 mg daily.  Continue on spironolactone as prior.  Monitor strict I's and O's and daily weights.  Repeat chest x-ray with stable mild cardiomegaly noted. 3. NASH liver cirrhosis with history of hepatic encephalopathy. Followed at [facility] gastroenterology clinic. She is status post TIPS procedure in 06/2018. She is on Xifaxan and lactulose.Continue spironolactone as ordered by GI. 4. Diabetes-stable. She is continued on reduced dose of Lantus. Continue on sliding scale insulin. 5. Recent asymptomatic COVID-19 infection. She had positive test on 08/27/2019. Since she has not had any symptoms, will not retest. Regular precautions. 6. Thrombocytopenia-stable.  Monitor repeat CBC with platelet transfusion on 1/15.  DVT prophylaxis:SCDs Code Status:Full Family Communication:Discussed with husband on phone Disposition Plan:   Repeat CBC and renal panel in a.m.  Continue diuresis for now.  Anticipate discharge in next 1-2 days once improved.   Consultants:  GI  Procedures:  None  Antimicrobials:   None  Subjective: Patient seen and evaluated today with no new acute complaints or concerns. No acute concerns or events noted overnight.  She still appears quite fatigued.  Objective: Vitals:   10/03/19 1725 10/03/19 2305 10/04/19 0500 10/04/19 0510  BP: (!) 117/52 (!) 116/43  (!) 118/36  Pulse: 74 76  75  Resp: 14 16  15   Temp: 98.7 F (37.1 C) 98.8 F (37.1 C)  98.4 F (36.9 C)  TempSrc: Oral Oral  Oral  SpO2: 97% 99%  96%  Weight:   98.7 kg   Height:        Intake/Output Summary (Last 24 hours) at 10/04/2019 1036 Last data filed at 10/04/2019 0941 Gross per 24 hour  Intake 2054.67 ml  Output 1100 ml  Net 954.67 ml   Filed Weights   10/02/19 0510 10/03/19 0500 10/04/19 0500  Weight: 94.9 kg 94.8 kg 98.7 kg    Examination:  General exam: Appears calm and comfortable  Respiratory system: Clear to auscultation. Respiratory effort normal. Cardiovascular system: S1 & S2 heard, RRR. No JVD, murmurs, rubs, gallops or clicks. No pedal edema. Gastrointestinal system: Abdomen is nondistended, soft and nontender. No organomegaly or masses felt. Normal bowel sounds heard. Central nervous system: Alert and arousable. Extremities: Symmetric 5 x 5 power. Skin: No rashes, lesions or ulcers Psychiatry: Flat affect.    Data Reviewed: I have personally reviewed following labs and imaging studies  CBC: Recent Labs  Lab 09/30/19 0612 10/01/19 0617 10/02/19 0703 10/03/19 0605 10/04/19 0835  WBC 3.0* 2.1* 1.8* 2.2* 2.5*  HGB 7.4* 6.9* 7.3* 7.7* 8.1*  HCT 24.7* 23.5* 24.6* 26.0* 27.3*  MCV 89.2 89.7 88.8 87.8 87.5  PLT 43* 35* 34* 27* 36*   Basic Metabolic Panel: Recent Labs  Lab 09/29/19 1718 09/30/19 0612 10/01/19 0617 10/01/19 1425 10/02/19 0703 10/03/19 0605 10/04/19 0835    NA 140   < > 139 137 139 140 141  K 3.2*   < > 3.2* 3.5 3.2* 3.5 3.3*  CL 112*   < > 109 110 109 111 105  CO2 23   < > 24 22 25 23 27   GLUCOSE 190*   < > 149* 240* 146* 133* 129*  BUN 17   < > 18 20 22 22  25*  CREATININE 0.91   < > 0.99 0.95 1.02* 0.86 0.98  CALCIUM 7.8*   < > 7.9* 7.5* 7.9* 8.8* 9.6  MG 2.2  --   --   --   --  1.9  --    < > = values in this interval not displayed.   GFR: Estimated Creatinine Clearance: 66.2 mL/min (by C-G formula based on SCr of 0.98 mg/dL). Liver Function Tests: Recent Labs  Lab 09/29/19 1718 10/01/19 0617 10/02/19 0703 10/03/19 0605 10/04/19 0835  AST 33 28 28 29 31   ALT 29 25 23 21 23   ALKPHOS 90 78 71 67 71  BILITOT 2.3* 2.4* 2.7* 2.6* 2.8*  PROT 4.8* 4.4* 4.8* 5.0* 5.1*  ALBUMIN 2.2* 2.0* 2.4* 2.7* 2.9*   No results for input(s): LIPASE, AMYLASE in the last 168 hours. No results for input(s): AMMONIA in the last  168 hours. Coagulation Profile: Recent Labs  Lab 09/29/19 1718 10/02/19 0703  INR 1.7* 1.7*   Cardiac Enzymes: No results for input(s): CKTOTAL, CKMB, CKMBINDEX, TROPONINI in the last 168 hours. BNP (last 3 results) No results for input(s): PROBNP in the last 8760 hours. HbA1C: No results for input(s): HGBA1C in the last 72 hours. CBG: Recent Labs  Lab 10/03/19 1245 10/03/19 1645 10/03/19 1736 10/03/19 2242 10/04/19 0717  GLUCAP 182* 154* 151* 154* 131*   Lipid Profile: No results for input(s): CHOL, HDL, LDLCALC, TRIG, CHOLHDL, LDLDIRECT in the last 72 hours. Thyroid Function Tests: No results for input(s): TSH, T4TOTAL, FREET4, T3FREE, THYROIDAB in the last 72 hours. Anemia Panel: No results for input(s): VITAMINB12, FOLATE, FERRITIN, TIBC, IRON, RETICCTPCT in the last 72 hours. Sepsis Labs: No results for input(s): PROCALCITON, LATICACIDVEN in the last 168 hours.  No results found for this or any previous visit (from the past 240 hour(s)).       Radiology Studies: DG CHEST PORT 1 VIEW  Result  Date: 10/03/2019 CLINICAL DATA:  Shortness of breath. EXAM: PORTABLE CHEST 1 VIEW COMPARISON:  08/25/2019. FINDINGS: PowerPort catheter noted with tip over SVC. Stable cardiomegaly. Mild infiltrate right upper lung cannot be excluded. No pleural effusion or pneumothorax. IMPRESSION: 1.  PowerPort catheter with tip over SVC. 2.  Stable cardiomegaly. 3.  Mild infiltrate right upper lung cannot be excluded. Electronically Signed   By: Marcello Moores  Register   On: 10/03/2019 12:52        Scheduled Meds: . bumetanide (BUMEX) IV  2 mg Intravenous Q12H  . busPIRone  10 mg Oral BID  . Chlorhexidine Gluconate Cloth  6 each Topical Daily  . insulin aspart  0-15 Units Subcutaneous TID WC  . insulin aspart  0-5 Units Subcutaneous QHS  . insulin glargine  35 Units Subcutaneous Daily  . lactulose  40 g Oral QID  . levothyroxine  25 mcg Oral QAC breakfast  . magnesium oxide  400 mg Oral Daily  . metolazone  5 mg Oral Daily  . pantoprazole  40 mg Oral QAC breakfast  . potassium chloride SA  40 mEq Oral TID  . rifaximin  550 mg Oral BID  . rosuvastatin  10 mg Oral q1800  . sertraline  50 mg Oral Daily  . spironolactone  100 mg Oral Daily   Continuous Infusions:   LOS: 4 days    Time spent: 30 minutes    Zoeya Gramajo Darleen Crocker, DO [facility] Pager [phone]  If 7PM-7AM, please contact night-coverage [email or web address] Password [facility] 10/04/2019, 10:36 AM

## 2019-10-05 ENCOUNTER — Encounter
Admit: 2019-10-05 | Discharge: 2019-10-21 | Disposition: A | Discharge status: Home Health | Payer: PRIVATE HEALTH INSURANCE | Attending: Certified Registered"

## 2019-10-05 ENCOUNTER — Ambulatory Visit
Admit: 2019-10-05 | Discharge: 2019-10-21 | Disposition: A | Discharge status: Home Health | Payer: PRIVATE HEALTH INSURANCE

## 2019-10-05 DIAGNOSIS — F419 Anxiety disorder, unspecified: Secondary | ICD-10-CM | POA: Diagnosis not present

## 2019-10-05 DIAGNOSIS — R531 Weakness: Secondary | ICD-10-CM | POA: Diagnosis not present

## 2019-10-05 DIAGNOSIS — K7469 Other cirrhosis of liver: Secondary | ICD-10-CM | POA: Diagnosis not present

## 2019-10-05 DIAGNOSIS — E876 Hypokalemia: Secondary | ICD-10-CM | POA: Diagnosis not present

## 2019-10-05 DIAGNOSIS — D649 Anemia, unspecified: Secondary | ICD-10-CM | POA: Diagnosis not present

## 2019-10-05 DIAGNOSIS — K922 Gastrointestinal hemorrhage, unspecified: Secondary | ICD-10-CM | POA: Diagnosis not present

## 2019-10-05 DIAGNOSIS — R161 Splenomegaly, not elsewhere classified: Secondary | ICD-10-CM | POA: Diagnosis not present

## 2019-10-05 DIAGNOSIS — K449 Diaphragmatic hernia without obstruction or gangrene: Secondary | ICD-10-CM | POA: Diagnosis not present

## 2019-10-05 DIAGNOSIS — I110 Hypertensive heart disease with heart failure: Secondary | ICD-10-CM | POA: Diagnosis not present

## 2019-10-05 DIAGNOSIS — K3189 Other diseases of stomach and duodenum: Secondary | ICD-10-CM | POA: Diagnosis not present

## 2019-10-05 DIAGNOSIS — M797 Fibromyalgia: Secondary | ICD-10-CM | POA: Diagnosis not present

## 2019-10-05 DIAGNOSIS — E119 Type 2 diabetes mellitus without complications: Secondary | ICD-10-CM | POA: Diagnosis not present

## 2019-10-05 DIAGNOSIS — K7290 Hepatic failure, unspecified without coma: Secondary | ICD-10-CM | POA: Diagnosis not present

## 2019-10-05 DIAGNOSIS — K5520 Angiodysplasia of colon without hemorrhage: Secondary | ICD-10-CM | POA: Diagnosis not present

## 2019-10-05 DIAGNOSIS — D550 Anemia due to glucose-6-phosphate dehydrogenase [G6PD] deficiency: Secondary | ICD-10-CM | POA: Diagnosis not present

## 2019-10-05 DIAGNOSIS — K760 Fatty (change of) liver, not elsewhere classified: Secondary | ICD-10-CM | POA: Diagnosis not present

## 2019-10-05 DIAGNOSIS — D500 Iron deficiency anemia secondary to blood loss (chronic): Secondary | ICD-10-CM | POA: Diagnosis not present

## 2019-10-05 DIAGNOSIS — I50810 Right heart failure, unspecified: Secondary | ICD-10-CM | POA: Diagnosis not present

## 2019-10-05 DIAGNOSIS — D509 Iron deficiency anemia, unspecified: Secondary | ICD-10-CM | POA: Diagnosis not present

## 2019-10-05 DIAGNOSIS — R079 Chest pain, unspecified: Secondary | ICD-10-CM | POA: Diagnosis not present

## 2019-10-05 DIAGNOSIS — K7200 Acute and subacute hepatic failure without coma: Secondary | ICD-10-CM | POA: Diagnosis not present

## 2019-10-05 DIAGNOSIS — K921 Melena: Secondary | ICD-10-CM | POA: Diagnosis not present

## 2019-10-05 DIAGNOSIS — K7581 Nonalcoholic steatohepatitis (NASH): Secondary | ICD-10-CM | POA: Diagnosis not present

## 2019-10-05 DIAGNOSIS — E860 Dehydration: Secondary | ICD-10-CM | POA: Diagnosis not present

## 2019-10-05 DIAGNOSIS — D61818 Other pancytopenia: Secondary | ICD-10-CM | POA: Diagnosis not present

## 2019-10-05 DIAGNOSIS — E039 Hypothyroidism, unspecified: Secondary | ICD-10-CM | POA: Diagnosis not present

## 2019-10-05 DIAGNOSIS — F329 Major depressive disorder, single episode, unspecified: Secondary | ICD-10-CM | POA: Diagnosis not present

## 2019-10-05 DIAGNOSIS — E7800 Pure hypercholesterolemia, unspecified: Secondary | ICD-10-CM | POA: Diagnosis not present

## 2019-10-05 DIAGNOSIS — R4182 Altered mental status, unspecified: Secondary | ICD-10-CM | POA: Diagnosis not present

## 2019-10-05 DIAGNOSIS — K31811 Angiodysplasia of stomach and duodenum with bleeding: Secondary | ICD-10-CM | POA: Diagnosis not present

## 2019-10-05 DIAGNOSIS — K31819 Angiodysplasia of stomach and duodenum without bleeding: Secondary | ICD-10-CM | POA: Diagnosis not present

## 2019-10-05 DIAGNOSIS — K766 Portal hypertension: Secondary | ICD-10-CM | POA: Diagnosis not present

## 2019-10-05 DIAGNOSIS — M810 Age-related osteoporosis without current pathological fracture: Secondary | ICD-10-CM | POA: Diagnosis not present

## 2019-10-05 DIAGNOSIS — E871 Hypo-osmolality and hyponatremia: Secondary | ICD-10-CM | POA: Diagnosis not present

## 2019-10-05 DIAGNOSIS — N179 Acute kidney failure, unspecified: Secondary | ICD-10-CM | POA: Diagnosis not present

## 2019-10-05 DIAGNOSIS — E785 Hyperlipidemia, unspecified: Secondary | ICD-10-CM | POA: Diagnosis not present

## 2019-10-05 DIAGNOSIS — F418 Other specified anxiety disorders: Secondary | ICD-10-CM | POA: Diagnosis not present

## 2019-10-05 DIAGNOSIS — R509 Fever, unspecified: Secondary | ICD-10-CM | POA: Diagnosis not present

## 2019-10-05 DIAGNOSIS — D72829 Elevated white blood cell count, unspecified: Secondary | ICD-10-CM | POA: Diagnosis not present

## 2019-10-05 DIAGNOSIS — Z794 Long term (current) use of insulin: Secondary | ICD-10-CM | POA: Diagnosis not present

## 2019-10-05 DIAGNOSIS — Z20822 Contact with and (suspected) exposure to covid-19: Secondary | ICD-10-CM | POA: Diagnosis not present

## 2019-10-05 DIAGNOSIS — G9341 Metabolic encephalopathy: Secondary | ICD-10-CM | POA: Diagnosis not present

## 2019-10-05 DIAGNOSIS — D62 Acute posthemorrhagic anemia: Secondary | ICD-10-CM | POA: Diagnosis not present

## 2019-10-05 LAB — COMPREHENSIVE METABOLIC PANEL
ALBUMIN: 2.7 g/dL — ABNORMAL LOW (ref 3.5–5.0)
ALKALINE PHOSPHATASE: 80 U/L (ref 38–126)
ALT (SGPT): 21 U/L (ref <35)
ANION GAP: 6 mmol/L — ABNORMAL LOW (ref 7–15)
BILIRUBIN TOTAL: 3.1 mg/dL — ABNORMAL HIGH (ref 0.0–1.2)
BLOOD UREA NITROGEN: 25 mg/dL — ABNORMAL HIGH (ref 7–21)
BUN / CREAT RATIO: 24
CALCIUM: 10 mg/dL (ref 8.5–10.2)
CO2: 30 mmol/L (ref 22.0–30.0)
CREATININE: 1.03 mg/dL — ABNORMAL HIGH (ref 0.60–1.00)
EGFR CKD-EPI AA FEMALE: 66 mL/min/{1.73_m2} (ref >=60)
EGFR CKD-EPI NON-AA FEMALE: 58 mL/min/{1.73_m2} — ABNORMAL LOW (ref >=60)
GLUCOSE RANDOM: 254 mg/dL — ABNORMAL HIGH (ref 70–179)
POTASSIUM: 3.9 mmol/L (ref 3.5–5.0)
PROTEIN TOTAL: 5.3 g/dL — ABNORMAL LOW (ref 6.5–8.3)
SODIUM: 138 mmol/L (ref 135–145)

## 2019-10-05 LAB — CBC W/ AUTO DIFF
BASOPHILS ABSOLUTE COUNT: 0 10*9/L (ref 0.0–0.1)
BASOPHILS RELATIVE PERCENT: 0.7 %
EOSINOPHILS RELATIVE PERCENT: 1.6 %
HEMATOCRIT: 29.4 % — ABNORMAL LOW (ref 36.0–46.0)
HEMOGLOBIN: 9.2 g/dL — ABNORMAL LOW (ref 12.0–16.0)
LARGE UNSTAINED CELLS: 3 % (ref 0–4)
LYMPHOCYTES ABSOLUTE COUNT: 0.7 10*9/L — ABNORMAL LOW (ref 1.5–5.0)
LYMPHOCYTES RELATIVE PERCENT: 20.9 %
MEAN CORPUSCULAR HEMOGLOBIN CONC: 31.3 g/dL (ref 31.0–37.0)
MEAN CORPUSCULAR HEMOGLOBIN: 26.7 pg (ref 26.0–34.0)
MEAN CORPUSCULAR VOLUME: 85.4 fL (ref 80.0–100.0)
MEAN PLATELET VOLUME: 12.8 fL — ABNORMAL HIGH (ref 7.0–10.0)
MONOCYTES RELATIVE PERCENT: 9 %
NEUTROPHILS ABSOLUTE COUNT: 2.1 10*9/L (ref 2.0–7.5)
NEUTROPHILS RELATIVE PERCENT: 64.7 %
PLATELET COUNT: 46 10*9/L — ABNORMAL LOW (ref 150–440)
RED BLOOD CELL COUNT: 3.44 10*12/L — ABNORMAL LOW (ref 4.00–5.20)
RED CELL DISTRIBUTION WIDTH: 21 % — ABNORMAL HIGH (ref 12.0–15.0)
WBC ADJUSTED: 3.2 10*9/L — ABNORMAL LOW (ref 4.5–11.0)

## 2019-10-05 LAB — APTT
APTT: 33 s (ref 25.3–37.1)
Coagulation surface induced:Time:Pt:PPP:Qn:Coag: 225
HEPARIN CORRELATION: 1.3

## 2019-10-05 LAB — FIBRINOGEN LEVEL: Fibrinogen:MCnc:Pt:PPP:Qn:Coag: 190

## 2019-10-05 LAB — AMMONIA
Ammonia: 72 umol/L — ABNORMAL HIGH (ref 9–35)
Ammonia:SCnc:Pt:Plas:Qn:: 46 — ABNORMAL HIGH

## 2019-10-05 LAB — SLIDE REVIEW

## 2019-10-05 LAB — PROTIME-INR
PROTIME: 20 s — ABNORMAL HIGH (ref 10.5–13.5)
PROTIME: 20.1 s — ABNORMAL HIGH (ref 10.5–13.5)

## 2019-10-05 LAB — AST (SGOT): Aspartate aminotransferase:CCnc:Pt:Ser/Plas:Qn:: 41 — ABNORMAL HIGH

## 2019-10-05 LAB — D-DIMER QUANTITATIVE (CH,ML,PD,ET): Fibrin D-dimer DDU:MCnc:Pt:PPP:Qn:: <150

## 2019-10-05 LAB — HEPARIN CORRELATION: Lab: 0.2

## 2019-10-05 LAB — PLATELET COUNT: Platelets:NCnc:Pt:Bld:Qn:Automated count: 41 — ABNORMAL LOW

## 2019-10-05 LAB — PROTIME: Coagulation tissue factor induced:Time:Pt:PPP:Qn:Coag: 20 — ABNORMAL HIGH

## 2019-10-05 LAB — POLYCHROMASIA

## 2019-10-05 LAB — INR: Coagulation tissue factor induced.INR:RelTime:Pt:PPP:Qn:Coag: 1.73

## 2019-10-05 LAB — MEAN CORPUSCULAR HEMOGLOBIN: Erythrocyte mean corpuscular hemoglobin:EntMass:Pt:RBC:Qn:Automated count: 26.7

## 2019-10-05 LAB — GLUCOSE, CAPILLARY
Glucose-Capillary: 156 mg/dL — ABNORMAL HIGH (ref 70–99)
Glucose-Capillary: 205 mg/dL — ABNORMAL HIGH (ref 70–99)

## 2019-10-05 LAB — BASIC METABOLIC PANEL
Anion gap: 10 (ref 5–15)
BUN: 24 mg/dL — ABNORMAL HIGH (ref 8–23)
CO2: 27 mmol/L (ref 22–32)
Calcium: 9.9 mg/dL (ref 8.9–10.3)
Chloride: 103 mmol/L (ref 98–111)
Creatinine, Ser: 0.91 mg/dL (ref 0.44–1.00)
GFR calc Af Amer: >60 mL/min (ref >60)
GFR calc non Af Amer: >60 mL/min (ref >60)
Glucose, Bld: 166 mg/dL — ABNORMAL HIGH (ref 70–99)
Potassium: 3.5 mmol/L (ref 3.5–5.1)
Sodium: 140 mmol/L (ref 135–145)

## 2019-10-05 LAB — CBC
HCT: 26 % — ABNORMAL LOW (ref 36.0–46.0)
Hemoglobin: 7.7 g/dL — ABNORMAL LOW (ref 12.0–15.0)
MCH: 25.9 pg — ABNORMAL LOW (ref 26.0–34.0)
MCHC: 29.6 g/dL — ABNORMAL LOW (ref 30.0–36.0)
MCV: 87.5 fL (ref 80.0–100.0)
Platelets: 32 10*3/uL — ABNORMAL LOW (ref 150–400)
RBC: 2.97 MIL/uL — ABNORMAL LOW (ref 3.87–5.11)
RDW: 21.4 % — ABNORMAL HIGH (ref 11.5–15.5)
WBC: 2.5 10*3/uL — ABNORMAL LOW (ref 4.0–10.5)
nRBC: 0 % (ref 0.0–0.2)

## 2019-10-05 LAB — MAGNESIUM: Magnesium: 1.7 mg/dL (ref 1.7–2.4)

## 2019-10-05 MED ORDER — HEPARIN SOD (PORK) LOCK FLUSH 100 UNIT/ML IV SOLN
500.0000 [IU] | INTRAVENOUS | Status: DC | PRN
  Filled 2019-10-05: qty 5

## 2019-10-05 MED ORDER — SPIRONOLACTONE 100 MG PO TABS: 100.0000 mg | ORAL_TABLET | Freq: Every day | ORAL | 3 refills | Status: DC

## 2019-10-05 MED ORDER — METOLAZONE 5 MG PO TABS: 5.0000 mg | ORAL_TABLET | Freq: Every day | ORAL | 0 refills | Status: DC

## 2019-10-05 NOTE — Discharge Summary (Signed)
Physician Discharge Summary  Maria [name] GYF:[id number] DOB: October 17, 1955 DOA: 09/29/2019  PCP: Glenda Chroman, MD  Admit date: 09/29/2019  Discharge date: 10/05/2019  Admitted From:Home  Disposition:  Home  Recommendations for Outpatient Follow-up:  1. Follow up with PCP in 1-2 weeks 2. Please obtain BMP/CBC in one week to ensure stability of hemoglobin levels and to evaluate electrolytes while on metolazone as well as spironolactone for assistance with diuresis 3. Follow-up with gastroenterologist Dr. Margaretha Seeds at [facility] with appointment that will be scheduled for endoscopy in the next 1-2 weeks as confirmed after discussion with Dr. Freddrick March at [facility]: Resume prior  Equipment/Devices: None  Discharge Condition: Stable  CODE STATUS: Full  Diet recommendation: Heart Healthy  Brief/Interim Summary: 64 year old female with a history of nonalcoholic liver cirrhosis, hepatic encephalopathy, esophageal varices, GAVE who has intermittently required transfusion of PRBCs, presents to the hospital with generalized weakness and low hemoglobin. Patient was sent to the emergency room by her gastroenterologist with reported hemoglobin of 5. On arrival to the emergency room, hemoglobin was noted to be low at 6. She was admitted for further transfusion. She is also noted to have volume overload and is being diuresed.  1/14: Patient continues to feel weak and has had some dark bowel movements noted. Her hemoglobin still remains 7.3. No other acute events otherwise noted. She is tolerating her diet. She will require another 1 unit PRBC transfusion as she has not had a significant improvement after her last unit on 1/13.  1/15:Patient continues to feel weak and has not had sufficient diuresis even with added metolazone yesterday evening. We will switch to Bumex IV and increased dose of metolazone today. She is noted to have significant thrombocytopenia in the setting of active  bleeding with positive stool occult noted. Will transfuse 1 unit of platelets. Hemoglobin has improved to 7.7. Continue monitoring in a.m. Appreciate GI recommendations.  1/16: Patient continues to have feelings of weakness, but hemoglobin levels have stabilized at 8.1 this morning with some improvement in platelet counts.  She continues to have some ongoing diuresis which is not likely being measured accurately.  Continue ongoing diuresis and anticipate discharge in the next 1-2 days if stable.  1/17: Patient continues to feel somewhat weak, but her hemoglobin levels are relatively stable with hemoglobin at 7.7 this morning and no further bleeding noted.  She appears to be optimized from a volume standpoint and still has some minimal volume overload, but nothing that is causing any respiratory symptoms.  Her weight has trended down from approximately 205 pounds on admission to 193 today.  She will remain on spironolactone as well as metolazone to assist with further diuresis at home and will need close follow-up with her PCP to reassess labs and volume levels.  We have attempted transfer to [facility] for further evaluation and management by her gastroenterologist Dr. Ahmed Prima, unfortunately no beds were available and we were assured that she was stable for discharge and would be appropriate for close outpatient follow-up for endoscopy in the next 1-2 weeks.  Follow-up appointment will be made for her by her gastroenterologist at [facility].  Discharge Diagnoses:  Active Problems:   Cirrhosis, nonalcoholic (HCC)   Diabetes mellitus ([place])   Thrombocytopenia ([place])   Esophageal varices in cirrhosis (HCC)   Acute on chronic right-sided congestive heart failure (HCC)   GAVE (gastric antral vascular ectasia)   Acute anemia  Principal discharge diagnosis: Acute on chronic anemia in the setting of GAVE and  esophageal varices with acute on chronic diastolic congestive heart failure.  Discharge  Instructions  Discharge Instructions    Diet - low sodium heart healthy   Complete by: As directed    Increase activity slowly   Complete by: As directed      Allergies as of 10/05/2019      Reactions   Ferumoxytol Other (See Comments)   Patient has severe back and chest pain when receiving FERAHEME infusions.   Tramadol Hcl Other (See Comments)   WEAKNESS      Medication List    TAKE these medications   atenolol 25 MG tablet Commonly known as: Tenormin Take 0.5 tablets (12.5 mg total) by mouth daily.   bumetanide 2 MG tablet Commonly known as: BUMEX Take 2 mg by mouth 2 (two) times daily.   busPIRone 10 MG tablet Commonly known as: BUSPAR Take 10 mg by mouth 2 (two) times daily.   Cholecalciferol 25 MCG (1000 UT) tablet Take 1,000 Units by mouth every other day.   FLINTSTONES PLUS IRON PO Take 2 tablets by mouth daily.   HumaLOG 100 UNIT/ML injection Generic drug: insulin lispro Inject 15-35 Units into the skin 3 (three) times daily with meals. Sliding scale insulin 100-150= 15 units 150-200= 20 units 250-300= 30 units 300-350= 35 units   lactulose 10 GM/15ML solution Commonly known as: CHRONULAC Take 60 mLs (40 g total) by mouth 4 (four) times daily. If having watery stools >3 times per day, then reduce to 3 times daily.   Lantus SoloStar 100 UNIT/ML Solostar Pen Generic drug: Insulin Glargine Inject 10-55 Units into the skin See admin instructions. Inject 55 units subcutaneously in the morning & 10 units subcutaneously at night.   levothyroxine 25 MCG tablet Commonly known as: SYNTHROID Take 25 mcg by mouth daily before breakfast.   lidocaine-prilocaine cream Commonly known as: EMLA Apply 1 application topically daily as needed.   magnesium oxide 400 MG tablet Commonly known as: MAG-OX Take 400 mg by mouth daily.   metolazone 5 MG tablet Commonly known as: ZAROXOLYN Take 1 tablet (5 mg total) by mouth daily. Start taking on: October 06, 2019    pantoprazole 40 MG tablet Commonly known as: PROTONIX Take 1 tablet (40 mg total) by mouth daily before breakfast.   potassium chloride SA 20 MEQ tablet Commonly known as: KLOR-CON Take 2 tablets (40 mEq total) by mouth daily.   rifaximin 550 MG Tabs tablet Commonly known as: XIFAXAN Take 1 tablet (550 mg total) by mouth 2 (two) times daily.   rosuvastatin 10 MG tablet Commonly known as: CRESTOR Take 10 mg by mouth daily.   sertraline 50 MG tablet Commonly known as: ZOLOFT Take 50 mg by mouth daily.   spironolactone 100 MG tablet Commonly known as: ALDACTONE Take 1 tablet (100 mg total) by mouth daily. Start taking on: October 06, 2019   trimethoprim-polymyxin b ophthalmic solution Commonly known as: POLYTRIM Place 1 drop into the left eye See admin instructions. Use 3 to 4 times daily for 2 days following monthly eye injection   zinc sulfate 220 (50 Zn) MG capsule Take 1 capsule (220 mg total) by mouth daily.      Follow-up Information    [place], [place]. Schedule an appointment as soon as possible for a visit in 1 week(s).   Contact information: [place] [id number] [phone]        Glenda Chroman, MD Follow up in 1  week(s).   Specialty: Internal Medicine Contact information: [place] [address] [phone]          Allergies  Allergen Reactions  . Ferumoxytol Other (See Comments)    Patient has severe back and chest pain when receiving FERAHEME infusions.   . Tramadol Hcl Other (See Comments)    WEAKNESS     Consultations:  GI   Procedures/Studies: DG CHEST PORT 1 VIEW  Result Date: 10/03/2019 CLINICAL DATA:  Shortness of breath. EXAM: PORTABLE CHEST 1 VIEW COMPARISON:  08/25/2019. FINDINGS: PowerPort catheter noted with tip over SVC. Stable cardiomegaly. Mild infiltrate right upper lung cannot be excluded. No pleural effusion or pneumothorax. IMPRESSION: 1.  PowerPort  catheter with tip over SVC. 2.  Stable cardiomegaly. 3.  Mild infiltrate right upper lung cannot be excluded. Electronically Signed   By: Marcello Moores  Register   On: 10/03/2019 12:52     Discharge Exam: Vitals:   10/04/19 2144 10/05/19 0602  BP: (!) 116/44 (!) 121/40  Pulse: 67 80  Resp: 18 19  Temp: 98.5 F (36.9 C) 98.1 F (36.7 C)  SpO2: 99% 97%   Vitals:   10/04/19 2009 10/04/19 2144 10/05/19 0500 10/05/19 0602  BP:  (!) 116/44  (!) 121/40  Pulse:  67  80  Resp:  18  19  Temp:  98.5 F (36.9 C)  98.1 F (36.7 C)  TempSrc:      SpO2: 97% 99%  97%  Weight:   87.8 kg   Height:        General: Pt is alert, awake, not in acute distress Cardiovascular: RRR, S1/S2 +, no rubs, no gallops Respiratory: CTA bilaterally, no wheezing, no rhonchi Abdominal: Soft, NT, ND, bowel sounds + Extremities: no edema, no cyanosis    The results of significant diagnostics from this hospitalization (including imaging, microbiology, ancillary and laboratory) are listed below for reference.     Microbiology: No results found for this or any previous visit (from the past 240 hour(s)).   Labs: BNP (last 3 results) Recent Labs    09/29/19 1718  BNP 02.6   Basic Metabolic Panel: Recent Labs  Lab 09/29/19 1718 09/30/19 0612 10/01/19 1425 10/02/19 0703 10/03/19 0605 10/04/19 0835 10/05/19 0704  NA 140   < > 137 139 140 141 140  K 3.2*   < > 3.5 3.2* 3.5 3.3* 3.5  CL 112*   < > 110 109 111 105 103  CO2 23   < > 22 25 23 27 27   GLUCOSE 190*   < > 240* 146* 133* 129* 166*  BUN 17   < > 20 22 22  25* 24*  CREATININE 0.91   < > 0.95 1.02* 0.86 0.98 0.91  CALCIUM 7.8*   < > 7.5* 7.9* 8.8* 9.6 9.9  MG 2.2  --   --   --  1.9  --  1.7   < > = values in this interval not displayed.   Liver Function Tests: Recent Labs  Lab 09/29/19 1718 10/01/19 0617 10/02/19 0703 10/03/19 0605 10/04/19 0835  AST 33 28 28 29 31   ALT 29 25 23 21 23   ALKPHOS 90 78 71 67 71  BILITOT 2.3* 2.4* 2.7*  2.6* 2.8*  PROT 4.8* 4.4* 4.8* 5.0* 5.1*  ALBUMIN 2.2* 2.0* 2.4* 2.7* 2.9*   No results for input(s): LIPASE, AMYLASE in the last 168 hours. No results for input(s): AMMONIA in the last 168 hours. CBC: Recent Labs  Lab 10/01/19  8867 10/02/19 0703 10/03/19 0605 10/04/19 0835 10/05/19 0704  WBC 2.1* 1.8* 2.2* 2.5* 2.5*  HGB 6.9* 7.3* 7.7* 8.1* 7.7*  HCT 23.5* 24.6* 26.0* 27.3* 26.0*  MCV 89.7 88.8 87.8 87.5 87.5  PLT 35* 34* 27* 36* 32*   Cardiac Enzymes: No results for input(s): CKTOTAL, CKMB, CKMBINDEX, TROPONINI in the last 168 hours. BNP: Invalid input(s): POCBNP CBG: Recent Labs  Lab 10/04/19 0717 10/04/19 1101 10/04/19 1602 10/04/19 2043 10/05/19 0733  GLUCAP 131* 233* 163* 227* 156*   D-Dimer No results for input(s): DDIMER in the last 72 hours. Hgb A1c No results for input(s): HGBA1C in the last 72 hours. Lipid Profile No results for input(s): CHOL, HDL, LDLCALC, TRIG, CHOLHDL, LDLDIRECT in the last 72 hours. Thyroid function studies No results for input(s): TSH, T4TOTAL, T3FREE, THYROIDAB in the last 72 hours.  Invalid input(s): FREET3 Anemia work up No results for input(s): VITAMINB12, FOLATE, FERRITIN, TIBC, IRON, RETICCTPCT in the last 72 hours. Urinalysis    Component Value Date/Time   COLORURINE YELLOW 08/27/2019 1126   APPEARANCEUR HAZY (A) 08/27/2019 1126   LABSPEC 1.017 08/27/2019 1126   PHURINE 5.0 08/27/2019 1126   GLUCOSEU NEGATIVE 08/27/2019 1126   HGBUR SMALL (A) 08/27/2019 1126   [place] 08/27/2019 1126   [place] 08/27/2019 1126   PROTEINUR NEGATIVE 08/27/2019 1126   NITRITE NEGATIVE 08/27/2019 1126   LEUKOCYTESUR NEGATIVE 08/27/2019 1126   Sepsis Labs Invalid input(s): PROCALCITONIN,  WBC,  LACTICIDVEN Microbiology No results found for this or any previous visit (from the past 240 hour(s)).   Time coordinating discharge: 35 minutes  SIGNED:   Rodena Goldmann, DO [facility] 10/05/2019, 9:44  AM  If 7PM-7AM, please contact night-coverage [email or web address]

## 2019-10-05 NOTE — TOC Transition Note (Signed)
Transition of Care [facility]) - CM/SW Discharge Note   Patient Details  Name: Maria [name] MRN: [id number] Date of Birth: 09-20-1955  Transition of Care [facility]) CM/SW Contact:  Sherald Barge, RN Phone Number: 10/05/2019, 9:53 AM   Clinical Narrative:   From home with husband. Pt "mostly"ind with ADL's. Husband provides transportation to appointments. Pt was DC from [facility] services Jan 8th. Pt is not interested in [facility] referral at this time, says she has to go to [facility] for appointments and doesn't think its a good idea right now. Pt communicates no TOC needs at this time.     Final next level of care: Home/Self Care     Readmission Risk Interventions Readmission Risk Prevention Plan 10/05/2019 09/01/2019 08/29/2019  Transportation Screening Complete - Complete  PCP or Specialist Appt within 3-5 Days Complete Complete -  HRI or [place] Not Complete Complete -  [place] or Home Care Consult comments Pt declined - -  Social Work Consult for [place] Planning/Counseling Not Complete Complete -  SW consult not completed comments N/A - -  Palliative Care Screening Not Complete Not Applicable -  Medication Review Press photographer) Complete - Complete  PCP or Specialist appointment within 3-5 days of discharge - - Not Complete  HRI or [place] - - Complete  SW Recovery Care/Counseling Consult - - Complete  Palliative Care Screening - - Not Complete  [place] - - Not Complete  Some recent data might be hidden

## 2019-10-06 ENCOUNTER — Encounter (INDEPENDENT_AMBULATORY_CARE_PROVIDER_SITE_OTHER): Payer: Self-pay

## 2019-10-06 LAB — CBC
HEMATOCRIT: 27.1 % — ABNORMAL LOW (ref 36.0–46.0)
HEMATOCRIT: 26.9 % — ABNORMAL LOW (ref 36.0–46.0)
HEMOGLOBIN: 8.5 g/dL — ABNORMAL LOW (ref 12.0–16.0)
HEMOGLOBIN: 8.2 g/dL — ABNORMAL LOW (ref 12.0–16.0)
MEAN CORPUSCULAR HEMOGLOBIN CONC: 31.3 g/dL (ref 31.0–37.0)
MEAN CORPUSCULAR HEMOGLOBIN CONC: 31.8 g/dL (ref 31.0–37.0)
MEAN CORPUSCULAR HEMOGLOBIN: 26.6 pg (ref 26.0–34.0)
MEAN CORPUSCULAR HEMOGLOBIN: 27 pg (ref 26.0–34.0)
MEAN CORPUSCULAR HEMOGLOBIN: 27 pg (ref 26.0–34.0)
MEAN CORPUSCULAR VOLUME: 85 fL (ref 80.0–100.0)
MEAN CORPUSCULAR VOLUME: 86.5 fL (ref 80.0–100.0)
MEAN PLATELET VOLUME: 9.4 fL (ref 7.0–10.0)
MEAN PLATELET VOLUME: 13 fL — ABNORMAL HIGH (ref 7.0–10.0)
PLATELET COUNT: 34 10*9/L — ABNORMAL LOW (ref 150–440)
PLATELET COUNT: 43 10*9/L — ABNORMAL LOW (ref 150–440)
PLATELET COUNT: 44 10*9/L — ABNORMAL LOW (ref 150–440)
RED BLOOD CELL COUNT: 3.17 10*12/L — ABNORMAL LOW (ref 4.00–5.20)
RED BLOOD CELL COUNT: 3.19 10*12/L — ABNORMAL LOW (ref 4.00–5.20)
RED BLOOD CELL COUNT: 3.04 10*12/L — ABNORMAL LOW (ref 4.00–5.20)
RED CELL DISTRIBUTION WIDTH: 21.2 % — ABNORMAL HIGH (ref 12.0–15.0)
RED CELL DISTRIBUTION WIDTH: 21.6 % — ABNORMAL HIGH (ref 12.0–15.0)
RED CELL DISTRIBUTION WIDTH: 20.9 % — ABNORMAL HIGH (ref 12.0–15.0)
WBC ADJUSTED: 3.1 10*9/L — ABNORMAL LOW (ref 4.5–11.0)
WBC ADJUSTED: 3.3 10*9/L — ABNORMAL LOW (ref 4.5–11.0)
WBC ADJUSTED: 2.5 10*9/L — ABNORMAL LOW (ref 4.5–11.0)

## 2019-10-06 LAB — URINALYSIS
BILIRUBIN UA: NEGATIVE
GLUCOSE UA: NEGATIVE
KETONES UA: NEGATIVE
LEUKOCYTE ESTERASE UA: NEGATIVE
NITRITE UA: NEGATIVE
PH UA: 7 (ref 5.0–9.0)
PROTEIN UA: NEGATIVE
RBC UA: <1 /HPF (ref <=4)
SPECIFIC GRAVITY UA: 1.009 (ref 1.003–1.030)
SQUAMOUS EPITHELIAL: 1 /HPF (ref 0–5)
UROBILINOGEN UA: 0.2
WBC UA: 1 /HPF (ref 0–5)

## 2019-10-06 LAB — PROTIME
Coagulation tissue factor induced:Time:Pt:PPP:Qn:Coag: 20.5 — ABNORMAL HIGH
Coagulation tissue factor induced:Time:Pt:PPP:Qn:Coag: 20.6 — ABNORMAL HIGH

## 2019-10-06 LAB — COMPREHENSIVE METABOLIC PANEL
ALBUMIN: 2.5 g/dL — ABNORMAL LOW (ref 3.5–5.0)
ALBUMIN: 2.5 g/dL — ABNORMAL LOW (ref 3.5–5.0)
ALKALINE PHOSPHATASE: 71 U/L (ref 38–126)
ALKALINE PHOSPHATASE: 77 U/L (ref 38–126)
ALT (SGPT): 20 U/L (ref <35)
ALT (SGPT): 20 U/L (ref <35)
ANION GAP: 2 mmol/L — ABNORMAL LOW (ref 7–15)
ANION GAP: 6 mmol/L — ABNORMAL LOW (ref 7–15)
AST (SGOT): 34 U/L (ref 14–38)
AST (SGOT): 37 U/L (ref 14–38)
BLOOD UREA NITROGEN: 23 mg/dL — ABNORMAL HIGH (ref 7–21)
BLOOD UREA NITROGEN: 23 mg/dL — ABNORMAL HIGH (ref 7–21)
BUN / CREAT RATIO: 23
BUN / CREAT RATIO: 24
CALCIUM: 9.7 mg/dL (ref 8.5–10.2)
CALCIUM: 9.8 mg/dL (ref 8.5–10.2)
CHLORIDE: 102 mmol/L (ref 98–107)
CHLORIDE: 102 mmol/L (ref 98–107)
CO2: 29 mmol/L (ref 22.0–30.0)
CO2: 29 mmol/L (ref 22.0–30.0)
CREATININE: 0.94 mg/dL (ref 0.60–1.00)
CREATININE: 0.99 mg/dL (ref 0.60–1.00)
EGFR CKD-EPI AA FEMALE: 70 mL/min/{1.73_m2} (ref >=60)
EGFR CKD-EPI AA FEMALE: 74 mL/min/{1.73_m2} (ref >=60)
EGFR CKD-EPI NON-AA FEMALE: 60 mL/min/{1.73_m2} (ref >=60)
EGFR CKD-EPI NON-AA FEMALE: 64 mL/min/{1.73_m2} (ref >=60)
GLUCOSE RANDOM: 154 mg/dL (ref 70–179)
GLUCOSE RANDOM: 159 mg/dL (ref 70–179)
POTASSIUM: 3.4 mmol/L — ABNORMAL LOW (ref 3.5–5.0)
POTASSIUM: 3.5 mmol/L (ref 3.5–5.0)
PROTEIN TOTAL: 5.1 g/dL — ABNORMAL LOW (ref 6.5–8.3)
PROTEIN TOTAL: 5.2 g/dL — ABNORMAL LOW (ref 6.5–8.3)
SODIUM: 133 mmol/L — ABNORMAL LOW (ref 135–145)
SODIUM: 137 mmol/L (ref 135–145)

## 2019-10-06 LAB — PROTIME-INR: INR: 1.78

## 2019-10-06 LAB — BLOOD UREA NITROGEN: Urea nitrogen:MCnc:Pt:Ser/Plas:Qn:: 23 — ABNORMAL HIGH

## 2019-10-06 LAB — RED CELL DISTRIBUTION WIDTH: Lab: 21.6 — ABNORMAL HIGH

## 2019-10-06 LAB — MAGNESIUM
Magnesium:MCnc:Pt:Ser/Plas:Qn:: 1.6
Magnesium:MCnc:Pt:Ser/Plas:Qn:: 1.6

## 2019-10-06 LAB — BILIRUBIN DIRECT: Bilirubin.glucuronidated+Bilirubin.albumin bound:MCnc:Pt:Ser/Plas:Qn:: 0.4

## 2019-10-06 LAB — PROTEIN TOTAL: Protein:MCnc:Pt:Ser/Plas:Qn:: 5.2 — ABNORMAL LOW

## 2019-10-06 LAB — RBC UA: Erythrocytes:Naric:Pt:Urine sed:Qn:Microscopy.light.HPF: <1

## 2019-10-06 LAB — MEAN CORPUSCULAR VOLUME: Erythrocyte mean corpuscular volume:EntVol:Pt:RBC:Qn:Automated count: 85

## 2019-10-06 LAB — WBC ADJUSTED: Leukocytes:NCnc:Pt:Bld:Qn:: 2.5 — ABNORMAL LOW

## 2019-10-06 NOTE — Unmapped (Signed)
Med W History and Physical    Assessment/Plan:    Active Problems:    * No active hospital problems. *    Marilyn French is a 64 y.o. female with PMHx as reviewed in the EMR that presented to [facility] with No Principal Problem: There is no principal problem currently on the Problem List. Please update the Problem List and refresh..    Hepatic Encephalopathy: Mental status and some waxing and waning and has been reports that her current state is consistent with how she acts whenever she has ammonia issues.  Serum ammonia level unreliable in setting of TIPS but nonetheless not significantly elevated at 46.  Patient only with 1 bowel movement today and unclear how many she has been having over the last several days despite aggressive lactulose, and her husband reports that she likely has not been having the proper number of bowel movements.  Patient reluctant to get enema at current time but will continue aggressive oral lactulose and rifaximin.  Additionally, getting urinalysis to rule out other treatable source of encephalopathy as she has some lower abdominal discomfort.  Overall belly soft and low concern for SBP.  - Lactulose 40 mg QID - titrate to 3-5 soft BMs daily; pt declined enema but I cautioned this might be necessary if no BM overnight  - Rifaxan   - UA    Melena, Microcytic Anemia: Ongoing problem for her in the past but reports worsening melena that prompted presentation to OSH.  Initial hemoglobin there was 5.8 on 1/11 and she ultimately req'd 4U RBCs over the course of a 5 day admission.  Still reports ongoing melena.  Hemoglobin now stable at 9.2, up from 7.7 as early as morning of [place] presentation (albeit on different equipment).  No evidence for brisk bleed and low concern for variceal issue given TIPS and clinical stability.  Most likely related to GAVE or other upper GI etiology.  Start IV PPI and lieu of oral PPI, start ceftriaxone PPx, hold off on octreotide. Platelets 46 at time of presentation, hold transfusion given unclear if active bleed and these are actually higher than she was at OSH (20s-30s).  - Hb q12h  - DIC profile  - IV ppi  - CTX 2g daily  - Hepatology in AM    NASH Cirrhosis s/p TIPS: Meld actually lower today at 35 and most recent clinic visit with hepatology in October last year.  Has been turned down twice for LFT.  Follows with Dr. Claudean Severance.  Overall appears well compensated other than encephalopathy and with volume status actually much improved per patient and discharge summary from OSH; down 12 lbs since presentation on 1/11 with IV diuresis and was transitioned to home bumex 2 mg BID in addition to new-start metolazone 5 mg daily. Reach out to hepatology in the morning. Regular diet as low likelihood for scope on MLK day.  - Hepatology consult  - Continue diuretics: bumex 2 mg BID, aldactone 100 mg daily, metolazone 5 mg daily  - Aggressive lactulose    MELD-Na score: 17 at 10/05/2019  5:08 PM  MELD score: 17 at 10/05/2019  5:08 PM  Calculated from:  Serum Creatinine: 1.03 mg/dL at 2/95/6213  0:86 PM  Serum Sodium: 138 mmol/L (Rounded to 137 mmol/L) at 10/05/2019  5:08 PM  Total Bilirubin: 3.1 mg/dL at 5/78/4696  2:95 PM  INR(ratio): 1.72 at 10/05/2019  5:08 PM  Age: 20 years 4 months    Elevated APTT: Significantly elevated to 225 at ED  presentation to [facility].  Unfortunately no prior for comparison from OSH admission that was normal on most recent check in [facility] system in 2020.  No heparin or anticoagulant exposure.  Thrombocytopenia baseline in setting of liver disease and this is also the most likely etiology for the APTT elevation but will add on DIC profile to rule out consumptive process and to confirm if this is actually a real value. Given elevated INR and APTT will give one time IV vitamin K dose pending hepatology eval.  - Trend aPTT  - DIC profile  - IV vit K 10 mg x1    Addendum: DIC profile returned with aPTT 33    DM: Was receiving lantus 55U in AM, 10U in PM at OSH; give 45U at bedtime (prior home dose) in setting of poor PO intake.  - lantus 45U at bedtime  - SSI    Anxiety/Depression: Home Buspar, zoloft  Hypothyroidism: home synthroid  HLD: crestor --> liptor per formulary    Code status confirmed full code on arrival and with husband as healthcare proxy.  I have a new great granddaughter and I am trying to get on the transplant list.  I have a lot to live for for.    Daily Checklist:  Diet: Regular Diet  DVT PPx: SCDs   GI PPx: PPI  Electrolytes: No Repletion Needed  Dispo: Admit to [facility] and Continue Routine Care  ___________________________________________________________________    Chief Complaint:  Chief Complaint   Patient presents with   ??? Altered Mental Status     No Principal Problem: There is no principal problem currently on the Problem List. Please update the Problem List and refresh.    HPI:  Marilyn French is a 64 y.o. female with history of NASH cirrhosis s/p TIPS c/b worsening HE, prior variceal bleed, GAVE, HTN, pHTN, anxiety, chronic microcytic anemia, DM, and recent OSH admission for melena who presents with ongoing melena and worsening encephalopathy.     Admitted to Osh on 1/11 after several days of melena.  Previous to this, she reported feeling weak but no other particular symptoms including abdominal pain.  Initial hemoglobin there 5.8 and required ultimately 4 units of red blood cells from 1/11-1/17.  She was also IV diuresed and received platelet transfusion.  Weight down from 205 to 193.  Remained hemodynamically stable time but remained weak as well.  She was attempted to be transferred to [facility] on 1/15 but there were no beds available.  Per discharge summary available, she was deemed stable for discharge for close outpatient follow-up with anoscopy in the next 1 to 2 weeks and follow-up with her [facility] gastroenterologist, Dr. Ruffin Frederick.  However, per her husband, he says they were directed to come directly to [facility] after discharge for admission.  Patient and family state that they have been worried that she has been look like she has more high ammonia in her blood, has been more sleepy and less positive than usual.  She is a retired Char[employer] fundraiser and typically is very sharp and able to manage all of her medications, and complete all ADLs/IADLs.    At [facility], she was found to have hemoglobin 9.4.  Last melenic episode overnight last night and no BM since.  Called out for admission given medical complexity and concern for safety at home.    Allergies:  Ferumoxytol, Definity [perflutren lipid microspheres], and Tramadol hcl    Medications:   Prior to Admission medications    Medication Dose, Route, Frequency  insulin lispro (HUMALOG) 100 unit/mL injection INJECT 15 TO 35 UNITS SUBCUTANEUOSLY PER SLIDING SCALE 3 TIMES DAILY    bumetanide (BUMEX) 2 MG tablet 2 mg, Oral, 2 times a day (standard)   busPIRone (BUSPAR) 10 MG tablet 10 mg, Oral, 2 times a day   cholecalciferol, vitamin D3, (CHOLECALCIFEROL) 1,000 unit tablet 1,000 Units, Oral, Every other day   insulin ASPART (NOVOLOG FLEXPEN U-100 INSULIN) 100 unit/mL injection pen Use with given sliding scale    CBG 70-120: 0 units  CBG 121-150: 1 unit  CBG 151-200: 2 units  CBG 201-250: 3 units  CBG 251-300: 5 units  CBG 301-350: 7 units  CBG greater than 351: 9 units   insulin glargine (LANTUS SOLOSTAR U-100 INSULIN) 100 unit/mL (3 mL) injection pen 45 Units, Subcutaneous, Daily (standard)   lactulose (CHRONULAC) 10 gram/15 mL solution 20 g, Oral, 4 times a day   levothyroxine (SYNTHROID, LEVOTHROID) 25 MCG tablet 25 mcg, Oral, Daily (standard)   lidocaine-prilocaine (EMLA) 2.5-2.5 % cream No dose, route, or frequency recorded.   magnesium oxide (MAG-OX) 400 mg (241.3 mg magnesium) tablet 800 mg, Oral, Daily (standard), Take for hand and/or leg cramping.   OXYGEN-AIR DELIVERY SYSTEMS MISC 2 L, Inhalation, Nightly   pantoprazole (PROTONIX) 40 MG tablet 40 mg, Oral, 2 times a day (standard)   pediatric multivit-iron-min (FLINTSTONES COMPLETE) tablet 2 tablets, Oral, Daily (standard)   pen needle, diabetic 31 gauge x 1/4 Ndle No dose, route, or frequency recorded.   potassium chloride SA (K-DUR,KLOR-CON) 20 MEQ tablet 20 mEq, Oral, Daily (standard)   rifAXIMin (XIFAXAN) 550 mg Tab 550 mg, Oral, 2 times a day (standard)   rosuvastatin (CRESTOR) 5 MG tablet 5 mg, Oral, Every other day   sertraline (ZOLOFT) 50 MG tablet 50 mg, Oral, Daily (standard), 1 tablet (50mg ) by mouth daily.   spironolactone (ALDACTONE) 100 MG tablet 100 mg, Oral, Daily (standard)       Medical History:  Past Medical History:   Diagnosis Date   ??? Cirrhosis (CMS-HCC)    ??? Depression    ??? Diabetes mellitus (CMS-HCC)    ??? Fibromyalgia    ??? GAVE (gastric antral vascular ectasia)    ??? History of transfusion 04/2019    reports frequent blood tranfusions   ??? HTN (hypertension)    ??? Hypercholesterolemia    ??? Hypothyroid    ??? NAFLD (nonalcoholic fatty liver disease)    ??? Osteoporosis        Surgical History:  Past Surgical History:   Procedure Laterality Date   ??? APPENDECTOMY     ??? CHOLECYSTECTOMY     ??? IR TIPS  07/08/2018    IR TIPS 07/08/2018 Soledad Gerlach, MD IMG VIR H&V [facility]   ??? PR RIGHT HEART CATH O2 SATURATION & CARDIAC OUTPUT Right 01/18/2018    Procedure: Right Heart Catheterization;  Surgeon: Marlaine Hind, MD;  Location: [facility] CATH;  Service: Cardiology   ??? PR UPPER GI ENDOSCOPY,CTRL BLEED N/A 03/05/2019    Procedure: UGI ENDOSCOPY; WITH CONTROL OF BLEEDING, ANY METHOD;  Surgeon: Andrey Farmer, MD;  Location: GI PROC[facility]URES [facility];  Service: Gastroenterology   ??? PR UPPER GI ENDOSCOPY,CTRL BLEED  04/04/2019    Procedure: UGI ENDOSCOPY; WITH CONTROL OF BLEEDING, ANY METHOD;  Surgeon: Janyth Pupa, MD;  Location: GI PROC[facility]URES [facility];  Service: Gastroenterology   ??? PR UPPER GI ENDOSCOPY,CTRL BLEED N/A 05/23/2019    Procedure: UGI ENDOSCOPY; WITH CONTROL OF BLEEDING, ANY METHOD;  Surgeon:  Janyth Pupa, MD;  Location: [facility]; Service: Gastroenterology   ??? PR UPPER GI ENDOSCOPY,CTRL BLEED N/A 06/13/2019    Procedure: UGI ENDOSCOPY; WITH CONTROL OF BLEEDING, ANY METHOD;  Surgeon: Annie Paras, MD;  Location: [facility]URES [facility];  Service: Gastroenterology   ??? PR UPPER GI ENDOSCOPY,CTRL BLEED N/A 07/11/2019    Procedure: UGI ENDOSCOPY; WITH CONTROL OF BLEEDING, ANY METHOD;  Surgeon: Annie Paras, MD;  [facility]: [facility];  Service: Gastroenterology   ??? PR UPPER GI ENDOSCOPY,CTRL BLEED N/A 09/16/2019    Procedure: UGI ENDOSCOPY; WITH CONTROL OF BLEEDING, ANY METHOD;  Surgeon: Beverly Milch, MD;  Location: [facility]URES [facility];  Service: Gastrointestinal   ??? PR UPPER GI ENDOSCOPY,DIAGNOSIS N/A 03/03/2019    Procedure: UGI ENDO, INCLUDE ESOPHAGUS, STOMACH, & DUODENUM &/OR JEJUNUM; DX W/WO COLLECTION SPECIMN, BY BRUSH OR WASH;  Surgeon: Luanne Bras, MD;  Location: GI PROC[facility]URES [facility];  Service: Gastroenterology   ??? SALPINGOOPHORECTOMY         Social History:  Social History     Socioeconomic History   ??? Marital status: Married     Spouse name: Not on file   ??? Number of children: Not on file   ??? Years of education: Not on file   ??? Highest education level: Not on file   Occupational History   ??? Not on file   Social Needs   ??? Financial resource strain: Not on file   ??? Food insecurity     Worry: Patient refused     Inability: Patient refused   ??? Transportation needs     Medical: Not on file     Non-medical: Not on file   Tobacco Use   ??? Smoking status: Never Smoker   ??? Smokeless tobacco: Never Used   Substance and Sexual Activity   ??? Alcohol use: No   ??? Drug use: No   ??? Sexual activity: Not on file   Lifestyle   ??? Physical activity     Days per week: Not on file     Minutes per session: Not on file   ??? Stress: Not on file   Relationships   ??? Social Wellsite geologist on phone: Not on file     Gets together: Not on file     Attends religious service: Not on file     Active member of club or organization: Not on file     Attends meetings of clubs or organizations: Not on file     Relationship status: Not on file   Other Topics Concern   ??? Not on file   Social History Narrative   ??? Not on file     Family History:  History reviewed. No pertinent family history.    Review of Systems:  10 systems reviewed and are negative unless otherwise mentioned in HPI    Labs/Studies:  Labs and Studies from the last 24hrs per EMR and Reviewed    Physical Exam:  BP 132/48  - Pulse 75  - Temp 36.8 ??C (Oral)  - Resp 18  - LMP  (LMP Unknown)  - SpO2 96%     Physical Exam:    General: Ackley ill-appearing, appears older than stated age.   Eyes closed during most of visit and is slow to respond but responds appropriately after prompting.  Defers to her husband for much of interview.  CV: Heart RRR, no murmurs. JVP non-elevated above  clavicle.  2+ pitting edema bilaterally in lower extremities up to mid calf.  Lungs: CTAB, normal work of breathing room air.  Abdomen: Soft, nondistended.  No obvious fluid wave.  Nontender throughout other than some mild epigastric tenderness.  Skin: Ashen appearance with some clear icteral jaundice.   Neuro: Oriented to person/place/time/place but is slow to respond and somnolent appearing. *6 present.  _______________    Larita Fife. Allyne Gee, MD  Internal Medicine PGY-3  10/05/19 8:43 PM

## 2019-10-06 NOTE — Unmapped (Signed)
PHYSICAL THERAPY  Evaluation (10/06/19 0850)   Seen with Bonnita Nasuti, OT, 2/2 decreased activity tolerance    Patient Name:  Marilyn French       Medical Record Number: [id number]   Date of Birth: 08-05-1956  Sex: Female            Treatment Diagnosis: impaired balance, gait instability, generalized weakness, decreased endurance, further limited by cognitive deficits    ASSESSMENT  Problem List: Decreased strength, Decreased endurance, Fall Risk, Impaired balance, Decreased coordination, Decreased mobility, Impaired judgement, Decreased safety awareness, Cognitive/Behavioral impairments, Decreased cognition, Gait deviation, Impaired ADLs     Assessment : Zakira Ressel is a 64 y.o. female with a significant PMH of diabetes, CHF, nonalcoholic liver cirrhosis, hepatic encephalopathy, esophageal varices, and GAVE presenting for evaluation of altered mental status and anemia. Pt presents to PT with deficits in functional moiblity (see problem list above). Pt will benefit from skilled acute PT to address mobility deficits. Recommend post-acute PT 5x/wk(low) at this time, as pt is at an elevated risk for falls and requires assist/supervision for all mobility/ADL. Anticipate progress to 3x/wk post-acute PT recs once cognition improves. After a review of the personal factors, comorbidities, clinical presentation, and examination of the number of affected body systems, the patient presents as a low complexity case.      Today's Interventions: Eval, ther act, transfers and pre-gait activities, pt ed: role of PT, POC, assist for all mobility at this time, safe use of RW.                          PLAN  Planned Frequency of Treatment:  1-2x per day for: 3-4x week      Planned Interventions: Balance activities, Endurance activities, Transfer training, Functional mobility, Education - Patient, Education - Family / caregiver, Home exercise program, Therapeutic exercise, Therapeutic activity, Gait training, Museum/gallery curator, Self-care / Home training    Post-Discharge Physical Therapy Recommendations:  5x weekly, Low intensity    PT DME Recommendations: Defer to post acute           Goals:   Patient and Family Goals: not stated    Long Term Goal #1: Pt will score 24/24 on AMPAC in 5 weeks       SHORT GOAL #1: Supine <> sit with independence.              Time Frame : 1 week  SHORT GOAL #2: Sit<>stand with mod indep and LRAD.              Time Frame : 1 week  SHORT GOAL #3: Pt will amb 150 ft mod indep with LRAD.              Time Frame : 2 weeks  SHORT GOAL #4: Pt will negotiate 4 steps with railing and supervision.              Time Frame : 2 weeks                      Prognosis:  Good  Positive Indicators: PLOF, participation  Barriers to Discharge: Impaired Balance, Inability to safely perform ADLS, Poor insight into deficits, Inaccessible home environment, Decreased safety awareness, Decreased caregiver support, Cognitive deficits, Gait instability    SUBJECTIVE  Patient reports: Pt/RN agreeable to PT  Current Functional Status: Pt presents and session concluded with pt supine in bed, all needs in reach, bed alarm activated, RN aware.  Prior Functional Status: PTA, pt reports she was ambulatory without device, spouse assists with some ADLs (Unable to specify which). Pt is a questionable historian--will need to clarify.  Equipment available at home: Denzil Hughes     Past Medical History:   Diagnosis Date   ??? Cirrhosis (CMS-HCC)    ??? Depression    ??? Diabetes mellitus (CMS-HCC)    ??? Fibromyalgia    ??? GAVE (gastric antral vascular ectasia)    ??? History of transfusion 04/2019    reports frequent blood tranfusions   ??? HTN (hypertension)    ??? Hypercholesterolemia    ??? Hypothyroid    ??? NAFLD (nonalcoholic fatty liver disease)    ??? Osteoporosis     Social History     Tobacco Use   ??? Smoking status: Never Smoker   ??? Smokeless tobacco: Never Used   Substance Use Topics   ??? Alcohol use: No      Past Surgical History:   Procedure Laterality Date   ??? APPENDECTOMY     ??? CHOLECYSTECTOMY     ??? IR TIPS  07/08/2018    IR TIPS 07/08/2018 Soledad Gerlach, MD IMG VIR H&V [facility]   ??? PR RIGHT HEART CATH O2 SATURATION & CARDIAC OUTPUT Right 01/18/2018    Procedure: Right Heart Catheterization;  Surgeon: Marlaine Hind, MD;  Location: [facility] CATH;  Service: Cardiology   ??? PR UPPER GI ENDOSCOPY,CTRL BLEED N/A 03/05/2019    Procedure: UGI ENDOSCOPY; WITH CONTROL OF BLEEDING, ANY METHOD;  Surgeon: Andrey Farmer, MD;  Location: [facility];  Service: Gastroenterology   ??? PR UPPER GI ENDOSCOPY,CTRL BLEED  04/04/2019    Procedure: UGI ENDOSCOPY; WITH CONTROL OF BLEEDING, ANY METHOD;  Surgeon: Janyth Pupa, MD;  Location: [facility];  Service: Gastroenterology   ??? PR UPPER GI ENDOSCOPY,CTRL BLEED N/A 05/23/2019    Procedure: UGI ENDOSCOPY; WITH CONTROL OF BLEEDING, ANY METHOD;  Surgeon: Janyth Pupa, MD;  Location: [facility]URES [facility];  Service: Gastroenterology   ??? PR UPPER GI ENDOSCOPY,CTRL BLEED N/A 06/13/2019    Procedure: UGI ENDOSCOPY; WITH CONTROL OF BLEEDING, ANY METHOD;  Surgeon: Annie Paras, MD;  Location: [facility];  Service: Gastroenterology   ??? PR UPPER GI ENDOSCOPY,CTRL BLEED N/A 07/11/2019    Procedure: UGI ENDOSCOPY; WITH CONTROL OF BLEEDING, ANY METHOD;  Surgeon: Annie Paras, MD;  Location: [facility];  Service: Gastroenterology   ??? PR UPPER GI ENDOSCOPY,CTRL BLEED N/A 09/16/2019    Procedure: UGI ENDOSCOPY; WITH CONTROL OF BLEEDING, ANY METHOD;  Surgeon: Beverly Milch, MD;  Location: [facility]URES [facility];  Service: Gastrointestinal   ??? PR UPPER GI ENDOSCOPY,DIAGNOSIS N/A 03/03/2019    Procedure: UGI ENDO, INCLUDE ESOPHAGUS, STOMACH, & DUODENUM &/OR JEJUNUM; DX W/WO COLLECTION SPECIMN, BY BRUSH OR WASH;  Surgeon: Luanne Bras, MD;  Location: [facility]URES [facility];  Service: Gastroenterology   ??? SALPINGOOPHORECTOMY      History reviewed. No pertinent family history.     Allergies: Ferumoxytol, Definity [perflutren lipid microspheres], and Tramadol hcl                Objective Findings  Precautions / Restrictions  Precautions: Falls precautions  Weight Bearing Status: Non-applicable  Required Braces or Orthoses: Non-applicable    Communication Preference: Verbal   Pain Comments: c/o abdominal pain, RN aware  Medical Tests / Procedures: vitals, labs, orders reviewed in Epic  Equipment / Environment: Vascular  access (PIV, TLC, Port-a-cath, PICC), Patient not wearing mask for full session    At Rest: VSS per Epic  review  With Activity: NAD  Orthostatics: asymptomatic  Airway Clearance: mobility to promote airway clearance    Living Situation  Living Environment: House(Per previous note in Epic)  Lives With: Spouse  Home Living: Two level home, Able to Live on main level with bedroom/bathroom, Built-in shower seat, Grab bars in shower, Raised toilet seat with rails, Stairs to enter with rails, Walk-in shower  Rail placement (outside): Bilateral rails  Number of Stairs: 4     Cognition: alert, oriented x0, able to follow simple commands with some extra cueing (verbal, visual and  tactile), delayed responses, difficulty with word-finding, requires frequent redirection to stay on task     Skin Inspection: mild BLE edema    UE ROM: WFL  UE Strength: WFL  LE ROM: WFL  LE Strength: WFL                       Sensation: endorses numbness/tingling to B feet  Balance: static sitting with supervision, static standing with CGA and RW         Bed Mobility: supine to sit with CGA, sit to supine with maxA to prevent pt from laying straight back/hitting head on bedrail  Transfers: sit<>stand with minA and RW, cueing for hand placement   Gait  Level of Assistance: Minimal assist, patient does 75% or more  Assistive Device: Front wheel walker  Distance Ambulated (ft): 3 ft  Gait: amb 2 ft side-stepping and 1 ft backward with minA and RW, required verbal and tactile cueing for sequencing, increased time for performance, hand over hand assist for progressing RW  Stairs: not yet ready to attempt      Endurance: decreased    Physical Therapy Session Duration  PT Individual - Duration: 30    Medical Staff Made Aware: RN    I attest that I have reviewed the above information.  Signed: Aldean Ast, PT  Filed 10/06/2019

## 2019-10-06 NOTE — Unmapped (Signed)
HEPATOLOGY INPATIENT CONSULTATION H&P      Requesting Attending Physician:  Donnal Moat, *  Requesting Consult Service: MedW    Reason for Consult:    Marilyn French is a 64 y.o. female seen in consultation at the request of Dr. Donnal Moat, * for cirrhosis s/p TIPS, GIB, HE.    Assessment and Recommendations:     64 y.o. female with PMH decompensated NASH cirrhosis c/b ascites, bleeding EV s/p TIPS, HE, and chronic anemia 2/2 GAVE who presents with altered mental status and melena resulting in acute on chronic anemia.    Hgb on presentation to OSH was 5.8, requiring 4u pRBCs over her 5 day admission. Hgb on admission here 9.2, 8.5 today at her baseline. Husband reports recent melena. Has a history of PHG and GAVE, treated most recently on 09/16/19 with banding x4 and APC. Given report of melena and acute anemia at OSH, will plan for upper endoscopy tomorrow 10/07/19. She does not have varices, so octreotide not indicated.     It is not clear if her mental status is all secondary to HE, as she does not have asterixis on exam but would treat with lactulose as if it is. She is at risk of HE with GI bleed, but would also rule out infection.      Recommendations:  - plan for EGD on Tuesday, 10/07/19  - clear liquid diet today only if mental status improves  - NPO at midnight  - IV PPI BID  - okay to continue home diuretics  - lactulose 20-30g q 2-4h until mental status clears then titrate to 3-5 bowel movements daily  - continue home rifaximin  - follow-up urine culture  - complete infectious workup with CXR, blood cultures  - consult PT/OT      Thank you for this consult. Seen and discussed with Dr. Sherryll Burger. We will continue to follow along. Please page the GI Hepatology consult pager at [phone] with any further questions or change in clinical condition.    Tammi Sou, MD  Gastroenterology & Hepatology Fellow, PGY-4  [facility]      HPI:     Chief Complaint: altered mental status    History of Present Illness:   This is a 64 y.o. female with PMHx as noted below who presented with altered mental status and for further evaluation of anemia.     She is unable to provide any history this morning due to her mental status. Per primary team admission note, admitted to OSH on 1/11 after several days of melena.  Previous to this, she reported feeling weak.  Initial hemoglobin there 5.8 and required 4 units of red blood cells from 1/11-1/17. Attempted transfer to [facility] on 1/15 but there were no beds available.  Per discharge summary available, she was deemed stable for discharge for close outpatient follow-up with anoscopy in the next 1 to 2 weeks and follow-up with her [facility] gastroenterologist, Dr. Ruffin Frederick.  However, per her husband, he says they were directed to come directly to [facility] after discharge for admission.    Most recent endoscopy was 09/16/19 at which time GAVE was banded and treated with APC. Repeat EGD was recommended in 4 weeks, but had not yet been scheduled.    She has been hemodynamically stable since arrival. Hemoglobin was 9.2 on presentation, down to 8.5 this morning. Remainder of labs at her baseline.    Review of Systems:  Review of systems was unobtainable due to  patient factors not responsive to questions..    Medical History:     Past Medical History:  Past Medical History:   Diagnosis Date   ??? Cirrhosis (CMS-HCC)    ??? Depression    ??? Diabetes mellitus (CMS-HCC)    ??? Fibromyalgia    ??? GAVE (gastric antral vascular ectasia)    ??? History of transfusion 04/2019    reports frequent blood tranfusions   ??? HTN (hypertension)    ??? Hypercholesterolemia    ??? Hypothyroid    ??? NAFLD (nonalcoholic fatty liver disease)    ??? Osteoporosis        Surgical History:  Past Surgical History:   Procedure Laterality Date   ??? APPENDECTOMY     ??? CHOLECYSTECTOMY     ??? IR TIPS  07/08/2018    IR TIPS 07/08/2018 Soledad Gerlach, MD IMG VIR H&V [facility]   ??? PR RIGHT HEART CATH O2 SATURATION & CARDIAC OUTPUT Right 01/18/2018    Procedure: Right Heart Catheterization;  Surgeon: Marlaine Hind, MD;  Location: [facility] CATH;  Service: Cardiology   ??? PR UPPER GI ENDOSCOPY,CTRL BLEED N/A 03/05/2019    Procedure: UGI ENDOSCOPY; WITH CONTROL OF BLEEDING, ANY METHOD;  Surgeon: Andrey Farmer, MD;  Location: [facility];  Service: Gastroenterology   ??? PR UPPER GI ENDOSCOPY,CTRL BLEED  04/04/2019    Procedure: UGI ENDOSCOPY; WITH CONTROL OF BLEEDING, ANY METHOD;  Surgeon: Janyth Pupa, MD;  Location: [facility];  Service: Gastroenterology   ??? PR UPPER GI ENDOSCOPY,CTRL BLEED N/A 05/23/2019    Procedure: UGI ENDOSCOPY; WITH CONTROL OF BLEEDING, ANY METHOD;  Surgeon: Janyth Pupa, MD;  [facility]: [facility];  Service: Gastroenterology   ??? PR UPPER GI ENDOSCOPY,CTRL BLEED N/A 06/13/2019    Procedure: UGI ENDOSCOPY; WITH CONTROL OF BLEEDING, ANY METHOD;  Surgeon: Annie Paras, MD;  Location: [facility];  Service: Gastroenterology   ??? PR UPPER GI ENDOSCOPY,CTRL BLEED N/A 07/11/2019    Procedure: UGI ENDOSCOPY; WITH CONTROL OF BLEEDING, ANY METHOD;  Surgeon: Annie Paras, MD;  Location: [facility];  Service: Gastroenterology   ??? PR UPPER GI ENDOSCOPY,CTRL BLEED N/A 09/16/2019    Procedure: UGI ENDOSCOPY; WITH CONTROL OF BLEEDING, ANY METHOD;  Surgeon: Beverly Milch, MD;  Location: [facility];  Service: Gastrointestinal   ??? PR UPPER GI ENDOSCOPY,DIAGNOSIS N/A 03/03/2019    Procedure: UGI ENDO, INCLUDE ESOPHAGUS, STOMACH, & DUODENUM &/OR JEJUNUM; DX W/WO COLLECTION SPECIMN, BY BRUSH OR WASH;  Surgeon: Luanne Bras, MD;  Location: [facility];  Service: Gastroenterology   ??? SALPINGOOPHORECTOMY         Family History:  The patient's family history is not on file..    Medications:   Current Facility-Administered Medications   Medication Dose Route Frequency Provider Last Rate Last Admin   ??? atorvastatin (LIPITOR) tablet 10 mg  10 mg Oral Nightly Damien Fusi, MD   10 mg at 10/05/19 2321   ??? bumetanide (BUMEX) tablet 2 mg  2 mg Oral BID Damien Fusi, MD   2 mg at 10/06/19 0831   ??? busPIRone (BUSPAR) tablet 10 mg  10 mg Oral BID Damien Fusi, MD   10 mg at 10/06/19 0831   ??? cefTRIAXone (ROCEPHIN) 2 g in sodium chloride 0.9 % (NS) 100 mL IVPB-connector bag  2 g Intravenous Q24H Damien Fusi, MD   Stopped at 10/05/19 2137   ???  dextrose 50 % in water (D50W) 50 % solution 12.5 g  12.5 g Intravenous Q10 Min PRN Damien Fusi, MD       ??? insulin glargine (LANTUS) injection 45 Units  45 Units Subcutaneous Daily Damien Fusi, MD   45 Units at 10/06/19 1610   ??? insulin lispro (HumaLOG) injection 0-12 Units  0-12 Units Subcutaneous ACHS Damien Fusi, MD   2 Units at 10/06/19 0829   ??? lactulose (CHRONULAC) oral solution (30 mL cup)  40 g Oral 4x Daily Damien Fusi, MD   40 g at 10/06/19 0551   ??? levothyroxine (SYNTHROID) tablet 25 mcg  25 mcg Oral Daily Damien Fusi, MD   25 mcg at 10/06/19 0830   ??? magnesium sulfate 2gm/32mL IVPB  2 g Intravenous Once Ezekiel Slocumb, MD 25 mL/hr at 10/06/19 0829 2 g at 10/06/19 0829   ??? meTOLAzone (ZAROXOLYN) tablet 5 mg  5 mg Oral Daily Damien Fusi, MD   5 mg at 10/06/19 0831   ??? pantoprazole (PROTONIX) injection 40 mg  40 mg Intravenous Daily Damien Fusi, MD   40 mg at 10/06/19 9604   ??? rifAXIMin (XIFAXAN) tablet 550 mg  550 mg Oral BID Damien Fusi, MD   550 mg at 10/06/19 0830   ??? sertraline (ZOLOFT) tablet 50 mg  50 mg Oral Daily Damien Fusi, MD   50 mg at 10/06/19 0831   ??? spironolactone (ALDACTONE) tablet 100 mg  100 mg Oral Daily Damien Fusi, MD   100 mg at 10/06/19 0830       Allergies:   Ferumoxytol, Definity [perflutren lipid microspheres], and Tramadol hcl    Social History:  Social History     Tobacco Use   ??? Smoking status: Never Smoker   ??? Smokeless tobacco: Never Used   Substance Use Topics   ??? Alcohol use: No   ??? Drug use: No       Objective:      Vital Signs/Weight:  Temp:  [35.9 ??C-36.8 ??C] 36.4 ??C  Heart Rate:  [71-77] 72  SpO2 Pulse:  [68-80] 80  Resp:  [18-19] 19  BP: (103-140)/(46-89) 115/47  MAP (mmHg):  [68-95] 68  SpO2:  [93 %-100 %] 95 %  BMI (Calculated):  [31.62] 31.62  Wt Readings from Last 3 Encounters:   10/05/19 83.6 kg (184 lb 4.9 oz)   08/12/19 78.6 kg (173 lb 3.2 oz)   07/11/19 87.3 kg (192 lb 7.4 oz)       Physical Exam:  GEN: comfortable-appearing elderly, pale woman sitting up in bed in NAD  EYES: EOMI, no scleral icterus  ENT: ATNC, MMM, OP clear  NECK: supple, trachea midline  CV: NRRR  PULM: normal WOB on room air, CTAB  ABD: soft, NTND, +BS; no ascites  EXT: warm, no peripheral edema  SKIN: no eruptions on visible skin; no jaundice; pale  NEURO: lethargic, falls asleep during questioning; unable to respond to orientation questions; no gross focal deficits; moves all 4 extremities; no asterixis    Diagnostic Studies:  I reviewed all pertinent diagnostic studies, including:      Labs:    Recent Labs     10/05/19  1708 10/05/19  2156 10/06/19  0349 10/06/19  0353   WBC 3.2*  --  3.3* 3.1*   HGB 9.2*  --  8.6* 8.5*   HCT 29.4*  --  26.9* 27.1*   PLT 46* 41* 43* 34*  Recent Labs     10/05/19  1708 10/06/19  0349 10/06/19  0353   NA 138 133* 137   K 3.9 3.4* 3.5   CL 102 102 102   BUN 25* 23* 23*   CREATININE 1.03* 0.94 0.99   GLU 254* 159 154     Recent Labs     10/05/19  1708 10/06/19  0349 10/06/19  0353   PROT 5.3* 5.2* 5.1*   ALBUMIN 2.7* 2.5* 2.5*   AST 41* 34 37   ALT 21 20 20    ALKPHOS 80 77 71   BILITOT 3.1* 3.0* 3.0*     Recent Labs     10/05/19  1708 10/05/19  2156 10/06/19  0349 10/06/19  0353   INR 1.72 1.73 1.78 1.77   APTT 225.0* 33.0  --   --    FIBRINOGEN  --  190  --   --        MELD-Na score: 17 at 10/06/2019  3:53 AM  MELD score: 17 at 10/06/2019  3:53 AM  Calculated from:  Serum Creatinine: 0.99 mg/dL (Rounded to 1 mg/dL) at 1/61/0960  4:54 AM  Serum Sodium: 137 mmol/L at 10/06/2019  3:53 AM  Total Bilirubin: 3.0 mg/dL at 0/98/1191  4:78 AM  INR(ratio): 1.77 at 10/06/2019  3:53 AM  Age: 47 years 4 months       Microbiology:  Covid negative 10/06/19      GI Procedures:   Upper GI endoscopy 09/16/19:   - Normal esophagus.                         - Portal hypertensive gastropathy.                         - Gastric antral vascular ectasia. Banded x4. Treated                          with argon plasma coagulation (APC).                         - Normal examined duodenum.                         - No specimens collected.    Upper GI endoscopy 07/11/19:   - Normal esophagus.                         - Portal hypertensive gastropathy.                         - Multiple bleeding angioectasias in the stomach.                          Treated with argon plasma coagulation (APC).                         - Multiple bleeding angioectasias in the stomach.                          Banded.                         - Normal examined duodenum.                         -  No specimens collected.    Video capsule endoscopy 06/14/19:   - Red blood in the stomach.                         - Irregular nodular mucosa in the stomach with                          bleeding. Not well visualized due to food but areas                          looked polypoid.                         - Normal duodenum.                         - A single angioectasia in the ileum. (This is not                          likely to be clinically significant)

## 2019-10-06 NOTE — Unmapped (Signed)
Pt is essentially total care. Pt needed to go to the bathroom, unable to walk to ED BR, had to be told to pull down her pants and underwear and to sit on the toilet.  Had difficulty getting the toilet paper off the roll. When back near the stretcher, had to be told to get up, turn around and sit down on the bed

## 2019-10-06 NOTE — Unmapped (Signed)
[facility] Emergency Department Provider Note      ED Course, Assessment and Plan     Initial Clinical Impression:    October 05, 2019 5:47 PM     Marilyn French is a 64 y.o. female with a significant PMH of diabetes, CHF, nonalcoholic liver cirrhosis, hepatic encephalopathy, esophageal varices, and GAVE presenting for evaluation of altered mental status and anemia.       BP 114/70  - Pulse 75  - Temp 36.8 ??C (98.2 ??F) (Oral)  - Resp 18  - LMP  (LMP Unknown)  - SpO2 100%       On exam patient appears tired and is in no distress.  Cardiac exam with normal rate and rhythm, no rubs or murmurs noted.  Lungs clear to auscultation bilaterally.  No abdominal tenderness to palpation.  +1 edema in bilateral lower extremities.  She is alert and oriented to name and date only.  Muscle strength 5/5 in the upper and lower extremities.  Sensation to light touch intact in upper and lower extremities.  Symmetrical facial expression.  No rashes or lesions noted.      MDM:    Differentials for altered mental status include hepatic encephalopathy, electrolyte imbalance.  Can consider infection, polypharmacy, trauma, and stroke to be causes of her AMS.  Will get basic labs, coags, UA, and ammonia level.  Will page [facility] for admission.      ED Course:   9:31 PM  WBC 3.2, Hgb 9.2 (previously 7.7 at OSH).  Ammonia 46 (previously 72 at OSH).  PTT to 25.  PT 20.  UA pending.  Patient remains stable and no change in presentation.       _____________________________________________________________________    The case was discussed with attending physician who is in agreement with the above assessment and plan    Additional Medical Decision Making     I have reviewed the vital signs and the nursing notes. Diagnostic studies including labs, EKG, and radiology results that were available during my care of the patient were independently reviewed by me and considered in my medical decision making.   I independently visualized the radiology images   I reviewed the patient's prior medical records if available.  Additional history obtained from family if available    History     CHIEF COMPLAINT:   Chief Complaint   Patient presents with   ??? Altered Mental Status       HPI: Marilyn French is a 64 y.o. female with a PMH of diabetes, CHF, nonalcoholic liver cirrhosis, hepatic encephalopathy, esophageal varices, and GAVE who presented to [facility] for generalized weakness and low hemoglobin.  She was sent by her gastroenterologist with reported hemoglobin of 5.  Was admitted and transfused with improvement in hemoglobin to 7.7 on the day of discharge of 1/17.  Jeani Hawking tried to initiate transfer to [facility] for further evaluation and management by her gastroenterologist, Dr. Raford Pitcher, but unfortunately no beds were available and they were told to present to [facility] ED for admission.    Per her husband, he states that she was more confused today and states her ammonia must be high.  She is currently taking lactulose and rifaximin, but she did not take it today. Denies fevers, CP, slurred speech, recent falls, muscle weakness, alcohol use.  He states this has happened before.        PAST MEDICAL HISTORY/PAST SURGICAL HISTORY:   Past Medical History:   Diagnosis Date   ???  Cirrhosis (CMS-HCC)    ??? Depression    ??? Diabetes mellitus (CMS-HCC)    ??? Fibromyalgia    ??? GAVE (gastric antral vascular ectasia)    ??? History of transfusion 04/2019    reports frequent blood tranfusions   ??? HTN (hypertension)    ??? Hypercholesterolemia    ??? Hypothyroid    ??? NAFLD (nonalcoholic fatty liver disease)    ??? Osteoporosis        Past Surgical History:   Procedure Laterality Date   ??? APPENDECTOMY     ??? CHOLECYSTECTOMY     ??? IR TIPS  07/08/2018    IR TIPS 07/08/2018 Soledad Gerlach, MD IMG VIR H&V [facility]   ??? PR RIGHT HEART CATH O2 SATURATION & CARDIAC OUTPUT Right 01/18/2018    Procedure: Right Heart Catheterization;  Surgeon: Marlaine Hind, MD;  Location: [facility] CATH;  Service: Cardiology   ??? PR UPPER GI ENDOSCOPY,CTRL BLEED N/A 03/05/2019    Procedure: UGI ENDOSCOPY; WITH CONTROL OF BLEEDING, ANY METHOD;  Surgeon: Andrey Farmer, MD;  Location: [facility];  Service: Gastroenterology   ??? PR UPPER GI ENDOSCOPY,CTRL BLEED  04/04/2019    Procedure: UGI ENDOSCOPY; WITH CONTROL OF BLEEDING, ANY METHOD;  Surgeon: Janyth Pupa, MD;  Location: [facility];  Service: Gastroenterology   ??? PR UPPER GI ENDOSCOPY,CTRL BLEED N/A 05/23/2019    Procedure: UGI ENDOSCOPY; WITH CONTROL OF BLEEDING, ANY METHOD;  Surgeon: Janyth Pupa, MD;  Location: [facility];  Service: Gastroenterology   ??? PR UPPER GI ENDOSCOPY,CTRL BLEED N/A 06/13/2019    Procedure: UGI ENDOSCOPY; WITH CONTROL OF BLEEDING, ANY METHOD;  Surgeon: Annie Paras, MD;  Location: [facility];  Service: Gastroenterology   ??? PR UPPER GI ENDOSCOPY,CTRL BLEED N/A 07/11/2019    Procedure: UGI ENDOSCOPY; WITH CONTROL OF BLEEDING, ANY METHOD;  Surgeon: Annie Paras, MD;  Location: [facility];  Service: Gastroenterology   ??? PR UPPER GI ENDOSCOPY,CTRL BLEED N/A 09/16/2019    Procedure: UGI ENDOSCOPY; WITH CONTROL OF BLEEDING, ANY METHOD;  Surgeon: Beverly Milch, MD;  Location: [facility];  Service: Gastrointestinal   ??? PR UPPER GI ENDOSCOPY,DIAGNOSIS N/A 03/03/2019    Procedure: UGI ENDO, INCLUDE ESOPHAGUS, STOMACH, & DUODENUM &/OR JEJUNUM; DX W/WO COLLECTION SPECIMN, BY BRUSH OR WASH;  Surgeon: Luanne Bras, MD;  Location: [facility];  Service: Gastroenterology   ??? SALPINGOOPHORECTOMY         MEDICATIONS:   No current facility-administered medications for this encounter.     Current Outpatient Medications:   ???  bumetanide (BUMEX) 2 MG tablet, Take 1 tablet (2 mg total) by mouth Two (2) times a day., Disp: 60 tablet, Rfl: 11  ???  busPIRone (BUSPAR) 10 MG tablet, Take 10 mg by mouth two (2) times a day., Disp: , Rfl:   ???  cholecalciferol, vitamin D3, (CHOLECALCIFEROL) 1,000 unit tablet, Take 1,000 Units by mouth every other day. , Disp: , Rfl:   ???  insulin ASPART (NOVOLOG FLEXPEN U-100 INSULIN) 100 unit/mL injection pen, Use with given sliding scale  CBG 70-120: 0 units CBG 121-150: 1 unit CBG 151-200: 2 units CBG 201-250: 3 units CBG 251-300: 5 units CBG 301-350: 7 units CBG greater than 351: 9 units, Disp: , Rfl:   ???  insulin glargine (LANTUS SOLOSTAR U-100 INSULIN) 100 unit/mL (3 mL) injection pen, Inject 0.45 mL (45 Units total) under the skin daily., Disp:  1350 Units, Rfl: 0  ???  lactulose (CHRONULAC) 10 gram/15 mL solution, Take 20 g by mouth Four (4) times a day. , Disp: , Rfl:   ???  levothyroxine (SYNTHROID, LEVOTHROID) 25 MCG tablet, Take 25 mcg by mouth daily., Disp: , Rfl:   ???  lidocaine-prilocaine (EMLA) 2.5-2.5 % cream, , Disp: , Rfl:   ???  magnesium oxide (MAG-OX) 400 mg (241.3 mg magnesium) tablet, Take 800 mg by mouth daily. Take for hand and/or leg cramping. , Disp: , Rfl:   ???  OXYGEN-AIR DELIVERY SYSTEMS MISC, Inhale 2 L nightly., Disp: , Rfl:   ???  pantoprazole (PROTONIX) 40 MG tablet, Take 40 mg by mouth Two (2) times a day. , Disp: , Rfl:   ???  pediatric multivit-iron-min (FLINTSTONES COMPLETE) tablet, Chew 2 tablets daily. , Disp: , Rfl:   ???  pen needle, diabetic 31 gauge x 1/4 Ndle, , Disp: , Rfl:   ???  potassium chloride SA (K-DUR,KLOR-CON) 20 MEQ tablet, Take 20 mEq by mouth daily. , Disp: , Rfl:   ???  rifAXIMin (XIFAXAN) 550 mg Tab, Take 1 tablet (550 mg total) by mouth Two (2) times a day., Disp: 60 tablet, Rfl: 11  ???  rosuvastatin (CRESTOR) 5 MG tablet, Take 5 mg by mouth every other day., Disp: , Rfl:   ???  sertraline (ZOLOFT) 50 MG tablet, Take 1 tablet (50 mg total) by mouth daily. 1 tablet (50mg ) by mouth daily., Disp: 30 tablet, Rfl: 1  ???  spironolactone (ALDACTONE) 100 MG tablet, Take 1 tablet (100 mg total) by mouth daily., Disp: 30 tablet, Rfl: 11    ALLERGIES:   Ferumoxytol, Definity [perflutren lipid microspheres], and Tramadol hcl    SOCIAL HISTORY:   Social History     Tobacco Use   ??? Smoking status: Never Smoker   ??? Smokeless tobacco: Never Used   Substance Use Topics   ??? Alcohol use: No       FAMILY HISTORY:  History reviewed. No pertinent family history.       Review of Systems    A 10 point review of systems was performed and is negative other than positive elements noted in HPI     Constitutional: Negative for fever, chills  Eyes: Negative for visual changes.  ENT: Negative for sore throat.  Cardiovascular: No chest pain or palpitations.  Respiratory: Negative for shortness of breath, cough, or wheezing.  Gastrointestinal: Negative for abdominal pain, vomiting or diarrhea.  Genitourinary: Negative for dysuria.  Musculoskeletal: Negative for back pain.  Skin: Negative for rash.  Neurological: Negative for headaches, focal weakness or numbness.    Physical Exam     VITAL SIGNS:    BP 114/70  - Pulse 75  - Temp 36.8 ??C (98.2 ??F) (Oral)  - Resp 18  - LMP  (LMP Unknown)  - SpO2 100%     Constitutional: Alert and oriented to name and date only.  Appears tired and in no distress. No increased respiratory effort.   Eyes: Conjunctivae are normal. PERRL  ENT       Head: Normocephalic and atraumatic.       Nose: No congestion.       Mouth/Throat: Mucous membranes are moist.       Neck: No stridor.  Hematological/Lymphatic/Immunilogical: No cervical lymphadenopathy.  Cardiovascular: S1, S2,  Normal and symmetric distal pulses are present in all extremities.Warm and well perfused.   Respiratory: Normal respiratory effort. Breath sounds are normal.  Gastrointestinal: Soft and nontender.  There is no CVA tenderness.  Musculoskeletal: Grossly normal range of motion in all extremities.       Right lower leg: No tenderness or edema.       Left lower leg: No tenderness or edema.  Neurologic: Normal speech and language. No gross focal neurologic deficits are appreciated. No gross asymmetrical weakness Skin: Skin is jaundice.  Psychiatric: Mood and affect are normal. Speech and behavior are normal.    Radiology     No orders to display       Labs     Labs Reviewed   AMMONIA - Abnormal; Notable for the following components:       Result Value    Ammonia 46 (*)     All other components within normal limits   CBC W/ AUTO DIFF - Abnormal; Notable for the following components:    WBC 3.2 (*)     RBC 3.44 (*)     HGB 9.2 (*)     HCT 29.4 (*)     RDW 21.0 (*)     Variable HGB Concentration Moderate (*)     Microcytosis Moderate (*)     Macrocytosis Slight (*)     Anisocytosis Moderate (*)     Hypochromasia Marked (*)     All other components within normal limits   COMPREHENSIVE METABOLIC PANEL   CBC W/ DIFFERENTIAL    Narrative:     The following orders were created for panel order CBC w/ Differential.                  Procedure                               Abnormality         Status                                     ---------                               -----------         ------                                     CBC w/ Differential[[id number]]         Abnormal            Preliminary result                         Morphology Review[[id number]]                               In process                                                   Please view results for these tests on the individual orders.   PROTIME-INR   APTT   SLIDE REVIEW       EKG: Not obtained.    Pertinent labs & imaging results that were available  during my care of the patient were reviewed by me and considered in my medical decision making (see chart for details).    Please note- This chart has been created using [employer]. Chart creation errors have been sought, but may not always be located and such creation errors, especially pronoun confusion, do NOT reflect on the standard of medical care.      Barbette Reichmann, DO  [facility], PGY-1  October 05, 2019 5:47 PM       Sherald Barge, DO  Resident  10/05/19 [phone]

## 2019-10-06 NOTE — Unmapped (Addendum)
Pt presents to ED with CC of AMS and black tarry stool. Pt not answering questions initially. Husband states She's uh, having high, ammonia is high. Here's the DC  Papers from [facility]. When she got discharged and everything she still has black stools, and her ammonia levels must be high because she's not really coherent. She went from 8.1 to 7.7 since yesterday on blood. PT slow to respond to questions, somewhat confused. Slight jaundice present.

## 2019-10-06 NOTE — Unmapped (Signed)
Pt arrived from ED at approx 2300 d/t AMS and possible upper GI bleed. Pt slid from stretcher to bed with assist x3, VSS. Pt was a pt on this unit two months ago, reoriented to room and unit. Pt A&Ox2, disoriented to situation and time. Pt though it was December and was uncertain as to why she was here. Pt was able to communicate to me that her husband went  home to feed the dogs. Medications administered per order. Vit K1 infusion tolerated well. VSS remains stable during and after infusion, see FLOWSHEETS. Pt tolerates PO intake without N/V, denies appetite at this time. Purewick utilized to collect urine. Pt called out when she felt the urge to urinate. After filling the cannister, purewick removed, pt demonstrated urinary continence. Pt remains free from fall and injury this shift. Bed alarm audible and activated. Pt currently resting in bed, eyes closed, no ASD noted. Call bell within reach. WCTM.     Problem: Adult Inpatient Plan of Care  Goal: Plan of Care Review  Outcome: Ongoing - Unchanged  Goal: Patient-Specific Goal (Individualization)  Outcome: Ongoing - Unchanged  Goal: Absence of Hospital-Acquired Illness or Injury  Outcome: Ongoing - Unchanged  Goal: Optimal Comfort and Wellbeing  Outcome: Ongoing - Unchanged  Goal: Readiness for Transition of Care  Outcome: Ongoing - Unchanged  Goal: Rounds/Family Conference  Outcome: Ongoing - Unchanged     Problem: Fall Injury Risk  Goal: Absence of Fall and Fall-Related Injury  Outcome: Ongoing - Unchanged     Problem: Self-Care Deficit  Goal: Improved Ability to Complete Activities of Daily Living  Outcome: Ongoing - Unchanged

## 2019-10-06 NOTE — Unmapped (Signed)
OCCUPATIONAL THERAPY  Evaluation(co-evaluation with Huey Romans, PT for safe pt mobility) (10/06/19 0851)    Patient Name:  Marilyn French       Medical Record Number: [id number]   Date of Birth: June 23, 1956  Sex: Female          OT Treatment Diagnosis:  altered mental status and deconditioning affecting pt's ability to complete ADLs.    Assessment  Problem List: Impaired ADLs, Fall Risk, Impaired judgement, Decreased cognition, Decreased safety awareness, Decreased mobility, Decreased endurance, Decreased knowledge of self-care  Assessment: Marilyn French is a 64 y.o. female with a significant PMH of diabetes, CHF, nonalcoholic liver cirrhosis, hepatic encephalopathy, esophageal varices, and GAVE presenting for evaluation of altered mental status and anemia. Pt with occupational deficits in ADLs, functional transfers, and functional mobility. Pt with altered mental status this date and unable to sequence through basic self-care and mobility. Pt had difficulty following simple one-step commands with repeated tactile and verbal cueing. Patient is now limited by problem list as mentioned above, impacting ADL/functional mobility performance and safety. Based on consideration of pt's occupational profile, assessment review, level of clinical decision making involved, and intervention plan, this pt is considered to be a moderate complexity case.  Today's Interventions: supine <> sit, sit <> stand, LB dressing, grooming, and functional mobility. OT educating pt re: post-acute recs, role of OT, and OT POC.    Activity Tolerance During Today's Session  Patient tolerated treatment well, Patient limited by Mental Status    Plan  Planned Frequency of Treatment:  1-2x per day for: 3-4x week     Planned Interventions:  Adaptive equipment, ADL retraining, Balance activities, Bed mobility, Compensatory tech. training, Conservation, Education - Patient, Home exercise program, Safety education, Functional mobility, Range of motion, UE Strength / coordination exercise, Transfer training, Positioning, Therapeutic exercise, Functional cognition, Postular / Proximal stability, Endurance activities, Education - Family / caregiver, Neuromuscular re-education    Post-Discharge Occupational Therapy Recommendations:  OT Post Acute Discharge Recommendations: 5x weekly, Low intensity   OT DME Recommendations: Defer to post acute    GOALS:   Patient and Family Goals: none stated    Long Term Goal #1: Pt will score 18+/24 on AMPAC in 4 weeks     Short Term:  Pt will complete full body dressing with min A   Time Frame : 2 weeks  Pt will complete grooming with set-up   Time Frame : 2 weeks  Pt will complete BSC transfers with set-up + LRAD   Time Frame : 2 weeks  Pt will follow one-step commands 100% of the time.   Time Frame : 2 weeks     Prognosis:  Fair  Positive Indicators:  PLOF   Barriers to Discharge: Impaired Balance, Inability to safely perform ADLS, Poor insight into deficits, Inaccessible home environment, Decreased safety awareness, Decreased caregiver support, Cognitive deficits, Gait instability    Subjective  Current Status Pt recieved and left supine in bed with call bell, bed alarm, and all needs in reach. RN aware.  Prior Functional Status Pt unable to provide accurate social history 2/2 altered mental status. Per Epic, Pt reports that PTA she was independent with ADL and mobilty. Pt states she has a PUW but does not use it. Spouse is home with pt to assist as needed. Pt is a retired [employer] fundraiser. She states she ha increased difficulty attending to tasks which has impacted her ability to engage in activities she enjoys, especially reading.    Medical Tests /  Procedures: Reviewed in Epic.       Patient / Caregiver reports: Pt stating uh... when asked name and DOB and also having difficulty answering simple yes/no questiong    Past Medical History:   Diagnosis Date   ??? Cirrhosis (CMS-HCC)    ??? Depression    ??? Diabetes mellitus (CMS-HCC) ??? Fibromyalgia    ??? GAVE (gastric antral vascular ectasia)    ??? History of transfusion 04/2019    reports frequent blood tranfusions   ??? HTN (hypertension)    ??? Hypercholesterolemia    ??? Hypothyroid    ??? NAFLD (nonalcoholic fatty liver disease)    ??? Osteoporosis     Social History     Tobacco Use   ??? Smoking status: Never Smoker   ??? Smokeless tobacco: Never Used   Substance Use Topics   ??? Alcohol use: No      Past Surgical History:   Procedure Laterality Date   ??? APPENDECTOMY     ??? CHOLECYSTECTOMY     ??? IR TIPS  07/08/2018    IR TIPS 07/08/2018 Soledad Gerlach, MD IMG VIR [facility]&V [facility]   ??? PR RIGHT HEART CATH O2 SATURATION & CARDIAC OUTPUT Right 01/18/2018    Procedure: Right Heart Catheterization;  Surgeon: Marlaine Hind, MD;  Location: [facility] CATH;  Service: Cardiology   ??? PR UPPER GI ENDOSCOPY,CTRL BLEED N/A 03/05/2019    Procedure: UGI ENDOSCOPY; WITH CONTROL OF BLEEDING, ANY METHOD;  Surgeon: Andrey Farmer, MD;  Location: [facility];  Service: Gastroenterology   ??? PR UPPER GI ENDOSCOPY,CTRL BLEED  04/04/2019    Procedure: UGI ENDOSCOPY; WITH CONTROL OF BLEEDING, ANY METHOD;  Surgeon: Janyth Pupa, MD;  Location: [facility];  Service: Gastroenterology   ??? PR UPPER GI ENDOSCOPY,CTRL BLEED N/A 05/23/2019    Procedure: UGI ENDOSCOPY; WITH CONTROL OF BLEEDING, ANY METHOD;  Surgeon: Janyth Pupa, MD;  Location: [facility];  Service: Gastroenterology   ??? PR UPPER GI ENDOSCOPY,CTRL BLEED N/A 06/13/2019    Procedure: UGI ENDOSCOPY; WITH CONTROL OF BLEEDING, ANY METHOD;  Surgeon: Annie Paras, MD;  Location: [facility];  Service: Gastroenterology   ??? PR UPPER GI ENDOSCOPY,CTRL BLEED N/A 07/11/2019    Procedure: UGI ENDOSCOPY; WITH CONTROL OF BLEEDING, ANY METHOD;  Surgeon: Annie Paras, MD;  Location: [facility];  Service: Gastroenterology   ??? PR UPPER GI ENDOSCOPY,CTRL BLEED N/A 09/16/2019 Procedure: UGI ENDOSCOPY; WITH CONTROL OF BLEEDING, ANY METHOD;  Surgeon: Beverly Milch, MD;  Location: [facility];  Service: Gastrointestinal   ??? PR UPPER GI ENDOSCOPY,DIAGNOSIS N/A 03/03/2019    Procedure: UGI ENDO, INCLUDE ESOPHAGUS, STOMACH, & DUODENUM &/OR JEJUNUM; DX W/WO COLLECTION SPECIMN, BY BRUSH OR WASH;  Surgeon: Luanne Bras, MD;  Location: [facility];  Service: Gastroenterology   ??? SALPINGOOPHORECTOMY      History reviewed. No pertinent family history.     Ferumoxytol, Definity [perflutren lipid microspheres], and Tramadol hcl     Objective Findings  Precautions / Restrictions  Falls precautions    Weight Bearing  Non-applicable    Required Braces or Orthoses  Non-applicable    Pain  no c/o pain    Equipment / Environment  Vascular access (PIV, TLC, Port-a-cath, PICC), Patient not wearing mask for full session    Living Situation  Living Environment: House(Per previous note in Epic)  Lives With: Spouse  Home Living: Two level home, Able  to Live on main level with bedroom/bathroom, Built-in shower seat, Grab bars in shower, Raised toilet seat with rails, Stairs to enter with rails, Walk-in shower  Rail placement (outside): Bilateral rails  Number of Stairs: 4     Cognition   Orientation Level:  Disoriented x 4(Pt unable to state name and DOB upon questioning)   Arousal/Alertness:  Inconsistent responses to stimuli   Attention Span:  Difficulty dividing attention, Difficulty attending to directions   Memory:  Decreased short term memory, Decreased long term memory, Decreased recall of recent events, Decreased recall of precautions, Decreased recall of biographical information   Following Commands:  Other(Pt followed one-step commands inconsistently )   Safety Judgment:  Decreased awareness of need for safety, Decreased awareness of need for assistance   Awareness of Errors:  Assistance required to identify errors made, Assistance required to correct errors made Problem Solving:  Assistance required to identify errors made, Assistance required to generate solutions, Assistance required to implement solutions   Comments: Pt is not oriented to self and had difficulty answering simple yes/no questions. Pt followed one-step commands inconsistently. When she was able to follow a commands required repeated verbal tactile cues with increased time for congitive processing. Pt also demonstrating poor attention and required hand over hand assistance to sequence through self-care and mobility.    Vision / Perception        Vision: (Pt not wearing glasses during evaluation)     Hand Function   B grip strength appear WFL    Skin Inspection  intact in visible areas     ROM / Strength/Coordination  UE ROM/ Strength/ Coordination: B UE appear WFL  LE ROM/ Strength/ Coordination: B LE appear WFL    Sensation:  Pt states her feet and numb/tingling sometimes    Balance:  CGA for static and dynamic sitting balance, min-CGA for static and dynamic standing balance (influenced by cognition) + RW    Functional Mobility  Transfer Assistance Needed: Yes(sit <> stand with min-CGA + RW and hand over hand assistance for transfer techniques. Pt completed side steps towards HOB with min A + RW. Pt required required a significant amount of time (~6 minutes) for cognitive processing and responded inconsistently to tactile cues.)  Bed Mobility Assistance Needed: Yes(supine <> sit with min A and verbal cues)    ADLs  ADLs: Needs assistance with ADLs  ADLs - Needs Assistance: Grooming, Bathing, Toileting, UB dressing, LB dressing, Feeding  Feeding - Needs Assistance: (mod A 2/2 cognition )  Grooming - Needs Assistance: (mod A)  Bathing - Needs Assistance: (max A)  Toileting - Needs Assistance: (max A)  UB Dressing - Needs Assistance: (mod A)  LB Dressing - Needs Assistance: (total A)    Vitals / Orthostatics  At Rest: NAD  With Activity: NAD  Orthostatics: Pt denied dizziness throughout    Medical Staff Made Aware: Valentina Lucks, RN updated and aware    Occupational Therapy Session Duration  OT Individual - Duration: 28       I attest that I have reviewed the above information.  Signed: Bonnita Nasuti, OT  Filed 10/06/2019

## 2019-10-06 NOTE — Unmapped (Signed)
Internal Medicine (MDW) Progress Note    Assessment & Plan:   Marilyn French is a 64 y.o. female with history of NASH cirrhosis s/p TIPS c/b worsening HE, prior variceal bleed, GAVE, HTN, pHTN, anxiety, chronic microcytic anemia, DM, and recent OSH admission for melena who presents with ongoing melena and worsening encephalopathy c/f GI bleed.    Principal Problem:    Upper GI bleed  Active Problems:    NAFLD (nonalcoholic fatty liver disease)    Cirrhosis (CMS-HCC)    GAVE (gastric antral vascular ectasia)    Hypothyroid    Hepatic encephalopathy (CMS-HCC)    Iron deficiency anemia  Resolved Problems:    * No resolved hospital problems. *      Hepatic Encephalopathy: Mental status with some waxing and waning and reports that her current state is consistent with how she acts whenever she has ammonia issues per spouse. Serum ammonia level unreliable in setting of TIPS but nonetheless not significantly elevated at 46. Patient only with 1 bowel movement yesterday 1/17 and unclear how many she has been having over the last several days despite aggressive lactulose, and her husband reports that she likely has not been having the proper number of bowel movements. Patient reluctant to get enema at current time but will continue aggressive oral lactulose and rifaximin. Urinalysis negative for infection. Overall belly soft and low concern for SBP.  - Lactulose 40 mg QID - titrate to 3-5 soft BMs daily; pt declined enema but I cautioned this might be necessary if no BM overnight  - Rifaxan     Melena, Microcytic Anemia: Ongoing problem for her in the past but reports worsening melena that prompted presentation to OSH. Initial hemoglobin there was 5.8 on 1/11 and she ultimately req'd 4U RBCs over the course of a 5 day admission. Still reports ongoing melena. Hemoglobin now stable at 8.5, up from 7.7 as early as morning of  presentation (albeit on different equipment). No evidence for brisk bleed and low concern for variceal issue given TIPS and clinical stability. Most likely related to GAVE or other upper GI etiology. Start IV PPI and lieu of oral PPI, start ceftriaxone PPx, hold off on octreotide. Platelets 46 at time of presentation, hold transfusion given unclear if active bleed and these are actually higher than she was at OSH (20s-30s).  - D/w hepatology this morning. They will plan for EGD tomorrow.   - NPO at [place]  - Hb q12h  - DIC profile  - IV ppi  - CTX 2g daily      NASH Cirrhosis s/p TIPS: Meld actually lower today at 17 and most recent clinic visit with hepatology in October last year. Has been turned down twice for LFT. Follows with Dr. Raford Pitcher. Overall appears well compensated other than encephalopathy and with volume status actually much improved per patient and discharge summary from OSH; down 12 lbs since presentation on 1/11 with IV diuresis and was transitioned to home bumex 2 mg BID in addition to new-start metolazone 5 mg daily. Reach out to hepatology in the morning. Regular diet as low likelihood for scope on MLK day.  - Hepatology consult as above  - Continue diuretics: bumex 2 mg BID, aldactone 100 mg daily, metolazone 5 mg daily  - Aggressive lactulose  ??  MELD-Na score: 17 at 10/05/2019  5:08 PM  MELD score: 17 at 10/05/2019  5:08 PM  Calculated from:  Serum Creatinine: 1.03 mg/dL at 0/34/7425  9:56 PM  Serum Sodium: 138  mmol/L (Rounded to 137 mmol/L) at 10/05/2019  5:08 PM  Total Bilirubin: 3.1 mg/dL at 1/61/0960  4:54 PM  INR(ratio): 1.72 at 10/05/2019  5:08 PM  Age: 57 years 4 months  ??  Elevated APTT: Initial concern for APTT of 225 in ED. Patient received one dose of IV vitamin K. DIC profile returned with aPTT 33.   - trend aPTT   -Daily DIC profile   ??  DM: Was receiving lantus 55U in AM, 10U in PM at OSH; give 45U at bedtime (prior home dose) in setting of poor PO intake.  - lantus 45U at bedtime  - SSI  ??  Anxiety/Depression: Home Buspar, zoloft  Hypothyroidism: home synthroid  HLD: crestor --> liptor per formulary  ??  Code status confirmed full code on arrival and with husband as healthcare proxy.   ??  Daily Checklist:  Diet: Regular Diet  DVT PPx: SCDs   GI PPx: PPI  Electrolytes: No Repletion Needed  Dispo: Admit to MDW and Continue Routine Care    Subjective:   Patient is extremely encephalopathic on interview. She is unsure if she had a bowel movement this morning. She denies any complaints at this time.     Objective:   Temp:  [35.9 ??C-36.8 ??C] 36.4 ??C  Heart Rate:  [71-77] 72  SpO2 Pulse:  [68-80] 80  Resp:  [18-19] 19  BP: (103-140)/(46-89) 115/47  SpO2:  [93 %-100 %] 95 %    Gen: Chronically ill-appearing, appears older than stated age. Slow to respond to questions.   HEENT: atraumatic, sclera anicteric, MMM. OP w/o erythema or exudate   Heart: RRR, S1, S2, no M/R/G, no chest wall tenderness  Lungs: CTAB, no crackles or wheezes, no use of accessory muscles  Abdomen: Normoactive bowel sounds, soft, NTND, no rebound/guarding. No appreciable fluid wave on exam.   Extremities: no clubbing, cyanosis. 2+ pitting edema to bilateral lower extremities.   Neuro: Oriented to person. Not oriented to place or time. She appears somnolent     Labs/Studies: Labs and Studies from the last 24hrs per EMR and Reviewed

## 2019-10-07 LAB — COMPREHENSIVE METABOLIC PANEL
ALBUMIN: 2.7 g/dL — ABNORMAL LOW (ref 3.5–5.0)
ALKALINE PHOSPHATASE: 86 U/L (ref 38–126)
ALT (SGPT): 21 U/L (ref <35)
ANION GAP: 6 mmol/L — ABNORMAL LOW (ref 7–15)
AST (SGOT): 37 U/L (ref 14–38)
BILIRUBIN TOTAL: 2.3 mg/dL — ABNORMAL HIGH (ref 0.0–1.2)
BLOOD UREA NITROGEN: 25 mg/dL — ABNORMAL HIGH (ref 7–21)
BUN / CREAT RATIO: 24
CALCIUM: 10.6 mg/dL — ABNORMAL HIGH (ref 8.5–10.2)
CHLORIDE: 97 mmol/L — ABNORMAL LOW (ref 98–107)
CO2: 34 mmol/L — ABNORMAL HIGH (ref 22.0–30.0)
CREATININE: 1.03 mg/dL — ABNORMAL HIGH (ref 0.60–1.00)
EGFR CKD-EPI AA FEMALE: 66 mL/min/{1.73_m2} (ref >=60)
EGFR CKD-EPI NON-AA FEMALE: 58 mL/min/{1.73_m2} — ABNORMAL LOW (ref >=60)
PROTEIN TOTAL: 5.3 g/dL — ABNORMAL LOW (ref 6.5–8.3)
SODIUM: 137 mmol/L (ref 135–145)

## 2019-10-07 LAB — MAGNESIUM: Magnesium:MCnc:Pt:Ser/Plas:Qn:: 1.7

## 2019-10-07 LAB — CBC
HEMATOCRIT: 28.8 % — ABNORMAL LOW (ref 36.0–46.0)
HEMOGLOBIN: 9.1 g/dL — ABNORMAL LOW (ref 12.0–16.0)
MEAN CORPUSCULAR HEMOGLOBIN CONC: 31.6 g/dL (ref 31.0–37.0)
MEAN CORPUSCULAR VOLUME: 84.7 fL (ref 80.0–100.0)
PLATELET COUNT: 51 10*9/L — ABNORMAL LOW (ref 150–440)
RED BLOOD CELL COUNT: 3.4 10*12/L — ABNORMAL LOW (ref 4.00–5.20)
RED CELL DISTRIBUTION WIDTH: 21.4 % — ABNORMAL HIGH (ref 12.0–15.0)
WBC ADJUSTED: 3.6 10*9/L — ABNORMAL LOW (ref 4.5–11.0)

## 2019-10-07 LAB — INR: Coagulation tissue factor induced.INR:RelTime:Pt:PPP:Qn:Coag: 1.53

## 2019-10-07 LAB — PHOSPHORUS: Phosphate:MCnc:Pt:Ser/Plas:Qn:: 3.8

## 2019-10-07 LAB — RED CELL DISTRIBUTION WIDTH: Lab: 21.4 — ABNORMAL HIGH

## 2019-10-07 LAB — PRO-BNP: Natriuretic peptide.B prohormone N-Terminal:MCnc:Pt:Ser/Plas:Qn:: 246 — ABNORMAL HIGH

## 2019-10-07 LAB — CALCIUM: Calcium:MCnc:Pt:Ser/Plas:Qn:: 10.6 — ABNORMAL HIGH

## 2019-10-07 NOTE — Unmapped (Signed)
Internal Medicine (MDW) Progress Note    Assessment & Plan:   Marilyn French is a 64 y.o. female with history of NASH cirrhosis s/p TIPS c/b worsening HE, prior variceal bleed, GAVE, HTN, pHTN, anxiety, chronic microcytic anemia, DM, and recent OSH admission for melena who presents with ongoing melena and worsening encephalopathy c/f GI bleed.    Principal Problem:    Upper GI bleed  Active Problems:    NAFLD (nonalcoholic fatty liver disease)    Cirrhosis (CMS-HCC)    GAVE (gastric antral vascular ectasia)    Hypothyroid    Hepatic encephalopathy (CMS-HCC)    Iron deficiency anemia  Resolved Problems:    * No resolved hospital problems. *      Hepatic Encephalopathy: Mental status with some waxing and waning and reports that her current state is consistent with how she acts whenever she has ammonia issues per spouse. Serum ammonia level unreliable in setting of TIPS but nonetheless not significantly elevated at 46. Patient only with 1 bowel movement yesterday 1/17 and unclear how many she has been having over the last several days despite aggressive lactulose, and her husband reports that she likely has not been having the proper number of bowel movements. Patient reluctant to get enema at current time but will continue aggressive oral lactulose and rifaximin. Urinalysis negative for infection. Overall belly soft and low concern for SBP.  - Hepatology saw patient yesterday. Will plan for scope today. Patient has been NPO since midnight.    - Hepatology also recommending blood cultures and CXR for complete infectious work-up  - Lactulose 40 mg QID - titrate to 3-5 soft BMs daily; pt declined enema but I cautioned this might be necessary if no BM overnight  - Rifaxan     Melena, Microcytic Anemia: Ongoing problem for her in the past but reports worsening melena that prompted presentation to OSH. Initial hemoglobin there was 5.8 on 1/11 and she ultimately req'd 4U RBCs over the course of a 5 day admission. Still reports ongoing melena. Hemoglobin now stable at 8.5, up from 7.7 as early as morning of [place] presentation (albeit on different equipment). No evidence for brisk bleed and low concern for variceal issue given TIPS and clinical stability. Most likely related to GAVE or other upper GI etiology. Start IV PPI and lieu of oral PPI, start ceftriaxone PPx, hold off on octreotide. Platelets 46 at time of presentation, hold transfusion given unclear if active bleed and these are actually higher than she was at OSH (20s-30s).  - D/w hepatology this morning. They will plan for EGD tomorrow.   - NPO at [place]  - Hb q12h  - DIC profile  - IV ppi BID   - CTX 2g daily      NASH Cirrhosis s/p TIPS: Meld actually lower today at 17 and most recent clinic visit with hepatology in October last year. Has been turned down twice for LFT. Follows with Dr. Raford Pitcher. Overall appears well compensated other than encephalopathy and with volume status actually much improved per patient and discharge summary from OSH; down 12 lbs since presentation on 1/11 with IV diuresis and was transitioned to home bumex 2 mg BID in addition to new-start metolazone 5 mg daily. Reach out to hepatology in the morning. Patient having multiple BMs since yesterday. No obvious hematochezia or melena noted.   - EGD today with GI as above.  - Continue diuretics: bumex 2 mg BID, aldactone 100 mg daily, metolazone 5 mg daily  -  Aggressive lactulose  ??  MELD-Na score: 17 at 10/05/2019  5:08 PM  MELD score: 17 at 10/05/2019  5:08 PM  Calculated from:  Serum Creatinine: 1.03 mg/dL at 1/61/0960  4:54 PM  Serum Sodium: 138 mmol/L (Rounded to 137 mmol/L) at 10/05/2019  5:08 PM  Total Bilirubin: 3.1 mg/dL at 0/98/1191  4:78 PM  INR(ratio): 1.72 at 10/05/2019  5:08 PM  Age: 83 years 4 months  ??  Elevated APTT: Initial concern for APTT of 225 in ED. Patient received one dose of IV vitamin K. DIC profile returned with aPTT 33.   - trend aPTT   -Daily DIC profile   ??  DM: Was receiving lantus 55U in AM, 10U in PM at OSH; give 45U at bedtime (prior home dose) in setting of poor PO intake.  - lantus 45U at bedtime  - SSI  ??  Anxiety/Depression: Home Buspar, zoloft  Hypothyroidism: home synthroid  HLD: crestor --> liptor per formulary  ??  Code status confirmed full code on arrival and with husband as healthcare proxy.   ??  Daily Checklist:  Diet: Regular Diet  DVT PPx: SCDs   GI PPx: PPI  Electrolytes: No Repletion Needed  Dispo: Admit to MDW and Continue Routine Care    Subjective:   Patient is still quite encephalopathic this morning. She does not complain of pain and is in no distress. Plan for scope this AM.     Objective:   Temp:  [36.6 ??C-36.8 ??C] 36.7 ??C  Heart Rate:  [74-76] 74  Resp:  [17-18] 18  BP: (111-132)/(40-50) 132/50  SpO2:  [94 %-95 %] 94 %    Gen: Chronically ill-appearing, appears older than stated age. Slow to respond to questions.   HEENT: atraumatic, sclera anicteric, MMM. OP w/o erythema or exudate   Heart: RRR, S1, S2, no M/R/G, no chest wall tenderness  Lungs: CTAB, no crackles or wheezes, no use of accessory muscles  Abdomen: Normoactive bowel sounds, soft, NTND, no rebound/guarding. No appreciable fluid wave on exam.   Extremities: no clubbing, cyanosis. 2+ pitting edema to bilateral lower extremities.   Neuro: Oriented to person. Not oriented to place or time. She appears somnolent     Labs/Studies: Labs and Studies from the last 24hrs per EMR and Reviewed

## 2019-10-07 NOTE — Unmapped (Signed)
HEPATOLOGY TREATMENT UPDATE    64 y.o. female with PMH decompensated NASH cirrhosis c/b ascites, bleeding EV, HE, and PVT s/p TIPS (06/2018), and chronic anemia 2/2 GAVE who presents with altered mental status and melena resulting in acute on chronic anemia. Seven bowel movements documented past 24 hours with lactulose therapy and mental status much improved this morning. Unclear if any melena seen, but Hgb stable this morning.     EGD today revealed portal hypertensive gastropathy as well as duodenopathy and severe GAVE with bleeding, treated with APC. Acute anemia likely secondary to GAVE.     Of note, she had a TIPS ultrasound during her last admission in Oct 2020 at which time there was concern for intra-TIPS stenosis. This was done to evaluate volume overload and ultimately it was determined that heart failure was contributing to volume overload and a TIPS revision was not recommended. Now that her volume status has improved, would like to repeat a TIPS ultrasound to re-evaluate the previous findings.    Recommendations:  - resume home oral PPI  - advance diet as tolerated  - continue lactulose 20-30g QID, titrate to 3-5 bowel movements daily  - continue home rifaximin  - continue home diuretics  - please obtain TIPS ultrasound  - repeat EGD in 4 weeks for re-treatment (ordered, she will receive a call to schedule)      Patient discussed with Dr. Sherryll Burger.  Please page the Hepatology consult pager at [phone] with any questions or clinical concerns.    Mellissa Kohut, MD  Gastroenterology & Hepatology Fellow, PGY-4  [facility]

## 2019-10-07 NOTE — Unmapped (Signed)
Care Management  Initial Transition Planning Assessment              General  Care Manager assessed the patient by : In person interview with patient  Orientation Level: Oriented X4  Functional level prior to admission: Partially Assisted  Who provides care at home?: Family member  Level of assistance required: Bathing, Dressing, Grooming, Toileting, Transferring, Walking  Reason for referral: Discharge Planning    Contact/Decision Maker  Extended Emergency Contact Information  Primary Emergency Contact: [place]  Address: [address]           [place], [place] [address] [place]  Home Phone: [phone]  Mobile Phone: [phone]  Relation: Spouse  Preferred language: ENGLISH  Interpreter needed? No  Secondary Emergency Contact: Danella Sensing  Work Phone: [phone]  Relation: Daughter    Legal Next of Kin / Guardian / POA / Advance Directives       Advance Directive (Medical Treatment)  Does patient have an advance directive covering medical treatment?: Patient has advance directive covering medical treatment, copy not in chart.  Advance directive covering medical treatment not in Chart:: Copy requested from family  Reason patient does not have an advance directive covering medical treatment:: Patient does not wish to complete one at this time.    Health Care Decision Maker [HCDM] (Medical & Mental Health Treatment)  Healthcare Decision Maker: HCDM documented in the HCDM/Contact Info section.  Information offered on HCDM, Medical & Mental Health advance directives:: Patient declined information.  Referral Made: No    Advance Directive (Mental Health Treatment)  Does patient have an advance directive covering mental health treatment?: Unable to assess (Pt. cognitively impaired, and/or unaccompanied).  Reason patient does not have an advance directive covering mental health treatment:: HCDM documented in the HCDM/Contact Info section.  CM met with patient in pt room. Pt/visitors were not wearing hospital provided masks for the duration of the interaction with CM.   CM was wearing hospital provided surgical mask and hospital provided eye protection.  CM was within 6 foot of the patient/visitors during this interaction.       Patient Information  Lives with: Spouse/significant other, Children  Pt currently lives with her husband.     Type of Residence: Private residence        Location/Detail: [facility]    Support Systems/Concerns: Children, Spouse  Pt has a daughter who lives in [place] and a daughter lives in [place].     Responsibilities/Dependents at home?: No    Home Care services in place prior to admission?: No  Type of Home Care services in place prior to admission: DME or oxygen  Current Home Care provider (Name/Phone #): Pt uses her [facility] for night time only.  Equipment Currently Used at Home: oxygen, cane, straight, walker, rolling  Current HME Agency (Name/Phone #): pt could not recall her [facility] company.    Currently receiving outpatient dialysis?: No       Financial Information   Pt receives SS retirement     Need for financial assistance?: No       Social Determinants of Health  Social Determinants of Health were addressed in provider documentation.  Please refer to patient history. pt denied having any food insecurities.     Discharge Needs Assessment  Concerns to be Addressed: discharge planning, care coordination/care conferences, adjustment to diagnosis/illness Pt stated she uses [facility] and uses nighttime only. PT OT recs are 5x per wk, unsure if  pt will progress to return home. Also unknown if pt will receive her rehab here near her daughter or by her husband.     Clinical Risk Factors: Multiple Diagnoses (Chronic), Functional Limitations, History of Falls    Barriers to taking medications: No    Prior overnight hospital stay or ED visit in last 90 days: Yes    Readmission Within the Last 30 Days: current reason for admission unrelated to previous admission         Anticipated Changes Related to Illness: inability to care for self         Discharge Facility/Level of Care Needs: nursing facility, skilled    Readmission  Risk of Unplanned Readmission Score:  %  Predictive Model Details   No score data available for [facility] Risk of Unplanned Readmission     Readmitted Within the Last 30 Days? (No if blank)   Patient at risk for readmission?: Yes    Discharge Plan  Screen findings are: Discharge planning needs identified or anticipated (Comment).    Expected Discharge Date: 10/08/2019    Expected Transfer from Critical Care: 10/10/19    Quality data for continuing care services shared with patient and/or representative?: Yes  Patient and/or family were provided with choice of facilities / services that are available and appropriate to meet post hospital care needs?: Yes       Initial Assessment complete?: Yes  Festus Barren, MSW  Page [phone]

## 2019-10-07 NOTE — Unmapped (Signed)
Mrs Tompkins vitals have remained stable during the shift. Pt went to have a GI procedure this morning and was sleepy when she returned to the unit. Pt tolerated the procedure well, per report. Pt has had multiple BM's during this shift. Pt was cleaned and placed on clean sheets. Pts bed has remained in the lowest position with wheels locked. Pt has call light and personal belongings within reach of pt a tall times. Pts family is at bedside. Pt will cont to be monitored by staff per unit policy.

## 2019-10-07 NOTE — Unmapped (Signed)
VSS, afebrile throughout shift. No acute events to report. Will continue plan of care, pt daughter French Ana visited today for most of shift, primary team came to update POC answer questions. Awake, alert and very oriented today, able to eat independent converse w daughter, ask questions regarding POC, family. Rested comfortably, labs drawn, H&H stable. IV Mg replaced. To be NPO at midnight for GI U endsocopy tomorrow. Lactulose continued. Pt safety maintained.   Problem: Adult Inpatient Plan of Care  Goal: Plan of Care Review  10/06/2019 1803 by Sherron Ales, RN  Outcome: Progressing  10/06/2019 1615 by Sherron Ales, RN  Outcome: Progressing  Goal: Patient-Specific Goal (Individualization)  10/06/2019 1803 by Sherron Ales, RN  Outcome: Progressing  10/06/2019 1615 by Sherron Ales, RN  Outcome: Progressing  Goal: Absence of Hospital-Acquired Illness or Injury  10/06/2019 1803 by Sherron Ales, RN  Outcome: Progressing  10/06/2019 1615 by Sherron Ales, RN  Outcome: Progressing  Goal: Optimal Comfort and Wellbeing  10/06/2019 1803 by Sherron Ales, RN  Outcome: Progressing  10/06/2019 1615 by Sherron Ales, RN  Outcome: Progressing  Goal: Readiness for Transition of Care  10/06/2019 1803 by Sherron Ales, RN  Outcome: Progressing  10/06/2019 1615 by Sherron Ales, RN  Outcome: Progressing  Goal: Rounds/Family Conference  10/06/2019 1803 by Sherron Ales, RN  Outcome: Progressing  10/06/2019 1615 by Sherron Ales, RN  Outcome: Progressing     Problem: Fall Injury Risk  Goal: Absence of Fall and Fall-Related Injury  10/06/2019 1803 by Sherron Ales, RN  Outcome: Progressing  10/06/2019 1615 by Sherron Ales, RN  Outcome: Progressing     Problem: Self-Care Deficit  Goal: Improved Ability to Complete Activities of Daily Living  10/06/2019 1803 by Sherron Ales, RN  Outcome: Progressing  10/06/2019 1615 by Sherron Ales, RN  Outcome: Progressing     Problem: Skin Injury Risk Increased Goal: Skin Health and Integrity  10/06/2019 1803 by Sherron Ales, RN  Outcome: Progressing  10/06/2019 1615 by Sherron Ales, RN  Outcome: Progressing     Problem: Adult Inpatient Plan of Care  Goal: Patient-Specific Goal (Individualization)  10/06/2019 1803 by Sherron Ales, RN  Outcome: Progressing  10/06/2019 1615 by Sherron Ales, RN  Outcome: Progressing     Problem: Adult Inpatient Plan of Care  Goal: Absence of Hospital-Acquired Illness or Injury  10/06/2019 1803 by Sherron Ales, RN  Outcome: Progressing  10/06/2019 1615 by Sherron Ales, RN  Outcome: Progressing     Problem: Adult Inpatient Plan of Care  Goal: Optimal Comfort and Wellbeing  10/06/2019 1803 by Sherron Ales, RN  Outcome: Progressing  10/06/2019 1615 by Sherron Ales, RN  Outcome: Progressing     Problem: Adult Inpatient Plan of Care  Goal: Readiness for Transition of Care  10/06/2019 1803 by Sherron Ales, RN  Outcome: Progressing  10/06/2019 1615 by Sherron Ales, RN  Outcome: Progressing     Problem: Adult Inpatient Plan of Care  Goal: Rounds/Family Conference  10/06/2019 1803 by Sherron Ales, RN  Outcome: Progressing  10/06/2019 1615 by Sherron Ales, RN  Outcome: Progressing

## 2019-10-07 NOTE — Unmapped (Signed)
Patient alert and oriented x3.  Still confused as to why she is here.  Has had multiple stools tonight, so lactulose effective.  Denies pain or nausea.  Urine output good with purewick.  Will continue to monitor closely and notify MD of changes.

## 2019-10-07 NOTE — Unmapped (Signed)
Negative COVID19 result received.  Patient notified via results known in hospital prior to procedure.

## 2019-10-08 DIAGNOSIS — K31819 Angiodysplasia of stomach and duodenum without bleeding: Principal | ICD-10-CM

## 2019-10-08 LAB — COMPREHENSIVE METABOLIC PANEL
ALBUMIN: 2.4 g/dL — ABNORMAL LOW (ref 3.5–5.0)
ALKALINE PHOSPHATASE: 84 U/L (ref 38–126)
ALT (SGPT): 22 U/L (ref <35)
ANION GAP: 5 mmol/L — ABNORMAL LOW (ref 7–15)
AST (SGOT): 43 U/L — ABNORMAL HIGH (ref 14–38)
BILIRUBIN TOTAL: 2.7 mg/dL — ABNORMAL HIGH (ref 0.0–1.2)
BLOOD UREA NITROGEN: 28 mg/dL — ABNORMAL HIGH (ref 7–21)
BUN / CREAT RATIO: 26
CALCIUM: 10.1 mg/dL (ref 8.5–10.2)
CHLORIDE: 97 mmol/L — ABNORMAL LOW (ref 98–107)
CO2: 32 mmol/L — ABNORMAL HIGH (ref 22.0–30.0)
EGFR CKD-EPI AA FEMALE: 62 mL/min/{1.73_m2} (ref >=60)
EGFR CKD-EPI NON-AA FEMALE: 54 mL/min/{1.73_m2} — ABNORMAL LOW (ref >=60)
GLUCOSE RANDOM: 272 mg/dL — ABNORMAL HIGH (ref 70–179)
POTASSIUM: 3 mmol/L — ABNORMAL LOW (ref 3.5–5.0)
PROTEIN TOTAL: 5 g/dL — ABNORMAL LOW (ref 6.5–8.3)
SODIUM: 134 mmol/L — ABNORMAL LOW (ref 135–145)

## 2019-10-08 LAB — MEAN CORPUSCULAR HEMOGLOBIN CONC: Erythrocyte mean corpuscular hemoglobin concentration:MCnc:Pt:RBC:Qn:Automated count: 31.4

## 2019-10-08 LAB — CBC
HEMATOCRIT: 27.2 % — ABNORMAL LOW (ref 36.0–46.0)
HEMOGLOBIN: 8.5 g/dL — ABNORMAL LOW (ref 12.0–16.0)
MEAN CORPUSCULAR HEMOGLOBIN CONC: 31.4 g/dL (ref 31.0–37.0)
MEAN CORPUSCULAR HEMOGLOBIN: 26.6 pg (ref 26.0–34.0)
MEAN CORPUSCULAR VOLUME: 84.7 fL (ref 80.0–100.0)
PLATELET COUNT: 47 10*9/L — ABNORMAL LOW (ref 150–440)
RED BLOOD CELL COUNT: 3.21 10*12/L — ABNORMAL LOW (ref 4.00–5.20)
RED CELL DISTRIBUTION WIDTH: 21 % — ABNORMAL HIGH (ref 12.0–15.0)

## 2019-10-08 LAB — PROTIME-INR: PROTIME: 18.4 s — ABNORMAL HIGH (ref 10.5–13.5)

## 2019-10-08 LAB — MAGNESIUM: Magnesium:MCnc:Pt:Ser/Plas:Qn:: 1.5 — ABNORMAL LOW

## 2019-10-08 LAB — CO2: Carbon dioxide:SCnc:Pt:Ser/Plas:Qn:: 32 — ABNORMAL HIGH

## 2019-10-08 LAB — PROTIME: Coagulation tissue factor induced:Time:Pt:PPP:Qn:Coag: 18.4 — ABNORMAL HIGH

## 2019-10-08 NOTE — Unmapped (Signed)
No concerns noted this shift.  VS WNL.  Mental status appears more clear today.  A/o x 4 this morning, continues to be drowsy throughout the day but reported that she did not sleep much last night.  2 BM this shift, continues to take lactulose as scheduled.  No falls or injuries noted.  Turned every 2 hours for skin protection and purewick remains in place.  Will continue POC.    Problem: Skin Injury Risk Increased  Goal: Skin Health and Integrity  Outcome: Ongoing - Unchanged     Problem: Diabetes Comorbidity  Goal: Blood Glucose Level Within Desired Range  Outcome: Ongoing - Unchanged

## 2019-10-08 NOTE — Unmapped (Signed)
Internal Medicine (MDW) Progress Note    Assessment & Plan:   Marilyn French is a 64 y.o. female with history of NASH cirrhosis s/p TIPS c/b worsening HE, prior variceal bleed, GAVE, HTN, pHTN, anxiety, chronic microcytic anemia, DM, and recent OSH admission for melena who presents with ongoing melena and worsening encephalopathy c/f GI bleed.    Principal Problem:    Upper GI bleed  Active Problems:    NAFLD (nonalcoholic fatty liver disease)    Cirrhosis (CMS-HCC)    GAVE (gastric antral vascular ectasia)    Hypothyroid    Hepatic encephalopathy (CMS-HCC)    Iron deficiency anemia  Resolved Problems:    * No resolved hospital problems. *      Hepatic Encephalopathy: Mental status with some waxing and waning and reports that her current state is consistent with how she acts whenever she has ammonia issues per spouse. Serum ammonia level unreliable in setting of TIPS but nonetheless not significantly elevated at 46. Patient only with 1 bowel movement yesterday 1/17 and unclear how many she has been having over the last several days despite aggressive lactulose, and her husband reports that she likely has not been having the proper number of bowel movements. Patient reluctant to get enema at current time but will continue aggressive oral lactulose and rifaximin. Urinalysis negative for infection. Overall belly soft and low concern for SBP.  - Scope yesterday with GI revealed portal hypertensive gastropathy as well as duodenopathy and severe GAVE with bleeding, treated with APC. Acute anemia likely secondary to GAVE >> recommending repeat EGD in 4 weeks for re-treatment   - Tips US performed -- per GI, Tips appears improved from previous imaging and they do not recommend revision at this time  - Lactulose 40 mg QID - titrate to 3-5 soft BMs daily. She has been responding very well since admission  - Rifaxan       Melena, Microcytic Anemia: Ongoing problem for her in the past but reports worsening melena that prompted presentation to OSH. Initial hemoglobin there was 5.8 on 1/11 and she ultimately req'd 4U RBCs over the course of a 5 day admission. Still reports ongoing melena. Hemoglobin now stable at 8.5, up from 7.7 as early as morning of [place] presentation (albeit on different equipment). No evidence for brisk bleed and low concern for variceal issue given TIPS and clinical stability. Most likely related to GAVE or other upper GI etiology. Start IV PPI and lieu of oral PPI, start ceftriaxone PPx, hold off on octreotide. Platelets 46 at time of presentation, hold transfusion given unclear if active bleed and these are actually higher than she was at OSH (20s-30s).  - See hepatology recs as above  - Hb q12h  - DIC profile  - IV ppi BID   - CTX 2g daily      NASH Cirrhosis s/p TIPS: Meld actually lower today at 17 and most recent clinic visit with hepatology in October last year. Has been turned down twice for LFT. Follows with Dr. Raford Pitcher. Overall appears well compensated other than encephalopathy and with volume status actually much improved per patient and discharge summary from OSH; down 12 lbs since presentation on 1/11 with IV diuresis and was transitioned to home bumex 2 mg BID in addition to new-start metolazone 5 mg daily. Reach out to hepatology in the morning. Patient having multiple BMs since yesterday. No obvious hematochezia or melena noted.   - EGD today with GI as above.  - Continue diuretics:  bumex 2 mg BID, aldactone 100 mg daily, metolazone 5 mg daily  - Aggressive lactulose  ??  MELD-Na score: 17 at 10/05/2019  5:08 PM  MELD score: 17 at 10/05/2019  5:08 PM  Calculated from:  Serum Creatinine: 1.03 mg/dL at 1/61/0960  4:54 PM  Serum Sodium: 138 mmol/L (Rounded to 137 mmol/L) at 10/05/2019  5:08 PM  Total Bilirubin: 3.1 mg/dL at 0/98/1191  4:78 PM  INR(ratio): 1.72 at 10/05/2019  5:08 PM  Age: 79 years 4 months  ??  Elevated APTT: Initial concern for APTT of 225 in ED. Patient received one dose of IV vitamin K. DIC profile returned with aPTT 33.   - trend aPTT   -Daily DIC profile   ??  DM: Was receiving lantus 55U in AM, 10U in PM at OSH; give 45U at bedtime (prior home dose) in setting of poor PO intake.  - lantus 45U at bedtime  - SSI  ??  Anxiety/Depression: Home Buspar, zoloft  Hypothyroidism: home synthroid  HLD: crestor --> liptor per formulary  ??  Code status confirmed full code on arrival and with husband as healthcare proxy.   ??  Daily Checklist:  Diet: Regular Diet  DVT PPx: SCDs   GI PPx: PPI  Electrolytes: No Repletion Needed  Dispo: Admit to MDW and Continue Routine Care    Subjective:   Patient's mental status much improved this morning. She is A&O x3 and asking appropriate questions about her hospitalization.  She states that she feels better than she has in several days. She has an appetite and is asking to eat breakfast. She's had at least 5 stools in the last 24 hours.     Objective:   Temp:  [36.2 ??C-36.7 ??C] 36.2 ??C  Heart Rate:  [71-82] 79  Resp:  [14-24] 24  BP: (116-136)/(40-58) 124/43  SpO2:  [91 %-100 %] 95 %    Gen: Chronically ill-appearing, appears older than stated age. Slow to respond to questions.   HEENT: atraumatic, sclera anicteric, MMM. OP w/o erythema or exudate   Heart: RRR, S1, S2, no M/R/G, no chest wall tenderness  Lungs: CTAB, no crackles or wheezes, no use of accessory muscles  Abdomen: Normoactive bowel sounds, soft, NTND, no rebound/guarding. No appreciable fluid wave on exam.   Extremities: no clubbing, cyanosis. 2+ pitting edema to bilateral lower extremities. Subtle asterixis on exam  Neuro: a&o x3. She appears somnolent     Labs/Studies: Labs and Studies from the last 24hrs per EMR and Reviewed

## 2019-10-08 NOTE — Unmapped (Addendum)
Pt still having BM's frequently in bed - turning patient q2 hr as well.  Pt oriented x 3-4 with sometimes delayed responses.  No c/o pain/discomfort.  Bed in lowest position and call light within reach.  VSS.  Will continue to monitor.    Problem: Adult Inpatient Plan of Care  Goal: Plan of Care Review  Outcome: Progressing  Goal: Patient-Specific Goal (Individualization)  Outcome: Progressing  Goal: Absence of Hospital-Acquired Illness or Injury  Outcome: Progressing  Goal: Optimal Comfort and Wellbeing  Outcome: Progressing  Goal: Readiness for Transition of Care  Outcome: Progressing  Goal: Rounds/Family Conference  Outcome: Progressing     Problem: Fall Injury Risk  Goal: Absence of Fall and Fall-Related Injury  Outcome: Progressing     Problem: Self-Care Deficit  Goal: Improved Ability to Complete Activities of Daily Living  Outcome: Progressing     Problem: Skin Injury Risk Increased  Goal: Skin Health and Integrity  Outcome: Progressing

## 2019-10-08 NOTE — Unmapped (Signed)
HEPATOLOGY TREATMENT UPDATE    64 y.o. female with PMH decompensated NASH cirrhosis c/b ascites, bleeding EV, HE, and PVT s/p TIPS (06/2018), and chronic anemia 2/2 GAVE who presents with altered mental status and melena resulting in acute on chronic anemia. EGD 10/07/19 revealed portal hypertensive gastropathy as well as duodenopathy and severe GAVE with bleeding, treated with APC. Acute anemia likely secondary to GAVE.     Repeat TIPS ultrasound with elevated velocities throughout the stent, indicative of mild stenosis, but without a significant step-off. Appears improved compared to his previous exam. Will not revise her TIPS at this time due to findings of elevated RVSP, moderately dilated RV, severe dilated RA on past TTE in October and concern could lead to worsening of R heart failure. Further, revision could worsen her hepatic encephalopathy which is already a problem for her, as demonstrated by this admission.    Hepatic encephalopathy improving, but still with some trace asterixis today. Would continue lactulose.    Recommendations:  - continue home oral PPI  - continue lactulose 20-30g QID, titrate to 3-5 bowel movements daily  - continue home rifaximin  - continue home diuretics  - appreciate PT/OT re-evaluation now that HE has improved  - repeat EGD in 4 weeks for re-treatment (ordered, she will receive a call to schedule)  - follow-up in Hepatology clinic as scheduled  - Hepatology will sign off at this time      Patient discussed with Dr. Sherryll Burger.  Please page the Hepatology consult pager at [phone] with any questions or clinical concerns.    Mellissa Kohut, MD  Gastroenterology & Hepatology Fellow, PGY-4  [facility]

## 2019-10-09 ENCOUNTER — Ambulatory Visit (INDEPENDENT_AMBULATORY_CARE_PROVIDER_SITE_OTHER): Payer: PPO | Admitting: Internal Medicine

## 2019-10-09 ENCOUNTER — Encounter (INDEPENDENT_AMBULATORY_CARE_PROVIDER_SITE_OTHER): Payer: Self-pay

## 2019-10-09 LAB — CBC
HEMOGLOBIN: 9.5 g/dL — ABNORMAL LOW (ref 12.0–16.0)
MEAN CORPUSCULAR HEMOGLOBIN CONC: 32.4 g/dL (ref 31.0–37.0)
MEAN CORPUSCULAR HEMOGLOBIN: 26.8 pg (ref 26.0–34.0)
MEAN CORPUSCULAR VOLUME: 82.7 fL (ref 80.0–100.0)
MEAN PLATELET VOLUME: 13.1 fL — ABNORMAL HIGH (ref 7.0–10.0)
PLATELET COUNT: 52 10*9/L — ABNORMAL LOW (ref 150–440)
RED BLOOD CELL COUNT: 3.55 10*12/L — ABNORMAL LOW (ref 4.00–5.20)
RED CELL DISTRIBUTION WIDTH: 21.6 % — ABNORMAL HIGH (ref 12.0–15.0)
WBC ADJUSTED: 3.4 10*9/L — ABNORMAL LOW (ref 4.5–11.0)

## 2019-10-09 LAB — COMPREHENSIVE METABOLIC PANEL
ALBUMIN: 2.8 g/dL — ABNORMAL LOW (ref 3.5–5.0)
ALT (SGPT): 33 U/L (ref <35)
ANION GAP: 7 mmol/L (ref 7–15)
AST (SGOT): 61 U/L — ABNORMAL HIGH (ref 14–38)
BILIRUBIN TOTAL: 4.7 mg/dL — ABNORMAL HIGH (ref 0.0–1.2)
BLOOD UREA NITROGEN: 28 mg/dL — ABNORMAL HIGH (ref 7–21)
BUN / CREAT RATIO: 22
CHLORIDE: 91 mmol/L — ABNORMAL LOW (ref 98–107)
CO2: 33 mmol/L — ABNORMAL HIGH (ref 22.0–30.0)
CREATININE: 1.26 mg/dL — ABNORMAL HIGH (ref 0.60–1.00)
EGFR CKD-EPI AA FEMALE: 52 mL/min/{1.73_m2} — ABNORMAL LOW (ref >=60)
EGFR CKD-EPI NON-AA FEMALE: 45 mL/min/{1.73_m2} — ABNORMAL LOW (ref >=60)
GLUCOSE RANDOM: 187 mg/dL — ABNORMAL HIGH (ref 70–179)
POTASSIUM: 3.3 mmol/L — ABNORMAL LOW (ref 3.5–5.0)
PROTEIN TOTAL: 5.9 g/dL — ABNORMAL LOW (ref 6.5–8.3)
SODIUM: 131 mmol/L — ABNORMAL LOW (ref 135–145)

## 2019-10-09 LAB — CREATININE: Creatinine:MCnc:Pt:Ser/Plas:Qn:: 1.26 — ABNORMAL HIGH

## 2019-10-09 LAB — PROTIME: Coagulation tissue factor induced:Time:Pt:PPP:Qn:Coag: 18.2 — ABNORMAL HIGH

## 2019-10-09 LAB — PHOSPHORUS: Phosphate:MCnc:Pt:Ser/Plas:Qn:: 4

## 2019-10-09 LAB — PROTIME-INR: INR: 1.56

## 2019-10-09 LAB — MAGNESIUM: Magnesium:MCnc:Pt:Ser/Plas:Qn:: 1.3 — ABNORMAL LOW

## 2019-10-09 LAB — PLATELET COUNT: Platelets:NCnc:Pt:Bld:Qn:Automated count: 52 — ABNORMAL LOW

## 2019-10-09 NOTE — Unmapped (Signed)
Additional order received and the patient is already on the caseload. Continue with  plan of care.

## 2019-10-09 NOTE — Unmapped (Signed)
Internal Medicine (MDW) Progress Note    Assessment & Plan:   Marilyn French is a 64 y.o. female with history of NASH cirrhosis s/p TIPS c/b worsening HE, prior variceal bleed, GAVE, HTN, pHTN, anxiety, chronic microcytic anemia, DM, and recent OSH admission for melena who presents with ongoing melena and worsening encephalopathy c/f GI bleed.    Principal Problem:    Upper GI bleed  Active Problems:    NAFLD (nonalcoholic fatty liver disease)    Cirrhosis (CMS-HCC)    GAVE (gastric antral vascular ectasia)    Hypothyroid    Hepatic encephalopathy (CMS-HCC)    Iron deficiency anemia  Resolved Problems:    * No resolved hospital problems. *      Hepatic Encephalopathy: Mental status with some waxing and waning and reports that her current state is consistent with how she acts whenever she has ammonia issues per spouse. Serum ammonia level unreliable in setting of TIPS but nonetheless not significantly elevated at 46. Patient only with 1 bowel movement yesterday 1/17 and unclear how many she has been having over the last several days despite aggressive lactulose, and her husband reports that she likely has not been having the proper number of bowel movements. Patient reluctant to get enema at current time but will continue aggressive oral lactulose and rifaximin. Urinalysis negative for infection. Overall belly soft and low concern for SBP.  - Increase confusion this morning and is very slow to respond to questions. She does not look as clinically well as she did yesterday. Will continue to monitor this morning. Likely will not discharge today. Low threshold to initiate infectious work-up if worse/fevers.   - Scope 1/19 with GI revealed portal hypertensive gastropathy as well as duodenopathy and severe GAVE with bleeding, treated with APC. Acute anemia likely secondary to GAVE >> recommending repeat EGD in 4 weeks for re-treatment   - Tips US performed -- per GI, Tips appears improved from previous imaging and they do not recommend revision at this time  - Decrease lactulose to 20 mg QID - titrate to 3-5 soft BMs daily as she's had 8 stools in the last 24 hours.   - Rifaxan       Melena, Microcytic Anemia: Ongoing problem for her in the past but reports worsening melena that prompted presentation to OSH. Initial hemoglobin there was 5.8 on 1/11 and she ultimately req'd 4U RBCs over the course of a 5 day admission. Still reports ongoing melena. Hemoglobin now stable at 8.5, up from 7.7 as early as morning of [name] presentation (albeit on different equipment). No evidence for brisk bleed and low concern for variceal issue given TIPS and clinical stability. Most likely related to GAVE or other upper GI etiology. Start IV PPI and lieu of oral PPI, start ceftriaxone PPx, hold off on octreotide. Platelets 46 at time of presentation, hold transfusion given unclear if active bleed and these are actually higher than she was at OSH (20s-30s).  - See hepatology recs as above  - Hb q12h  - DIC profile  - IV ppi BID     NASH Cirrhosis s/p TIPS: Meld actually lower today at 17 and most recent clinic visit with hepatology in October last year. Has been turned down twice for LFT. Follows with Dr. Raford Pitcher. Overall appears well compensated other than encephalopathy and with volume status actually much improved per patient and discharge summary from OSH; down 12 lbs since presentation on 1/11 with IV diuresis and was transitioned to home bumex  2 mg BID in addition to new-start metolazone 5 mg daily. Reach out to hepatology in the morning. Patient having multiple BMs since yesterday. No obvious hematochezia or melena noted.   - Continue diuretics: bumex 2 mg BID, aldactone 100 mg daily, metolazone 5 mg daily  - 20mg  lactulose QID. Titrate to 3-5 BMs per day  ??  MELD-Na score: 17 at 10/05/2019  5:08 PM  MELD score: 17 at 10/05/2019  5:08 PM  Calculated from:  Serum Creatinine: 1.03 mg/dL at 1/61/0960  4:54 PM  Serum Sodium: 138 mmol/L (Rounded to 137 mmol/L) at 10/05/2019  5:08 PM  Total Bilirubin: 3.1 mg/dL at 0/98/1191  4:78 PM  INR(ratio): 1.72 at 10/05/2019  5:08 PM  Age: 61 years 4 months  ??  Elevated APTT: Initial concern for APTT of 225 in ED. Patient received one dose of IV vitamin K. DIC profile returned with aPTT 33.   - trend aPTT   -Daily DIC profile   ??  DM: Was receiving lantus 55U in AM, 10U in PM at OSH; give 45U at bedtime (prior home dose) in setting of poor PO intake.  - lantus 45U at bedtime  - SSI  ??  Anxiety/Depression: Home Buspar, zoloft  Hypothyroidism: home synthroid  HLD: crestor --> liptor per formulary  ??  Code status confirmed full code on arrival and with husband as healthcare proxy.   ??  Daily Checklist:  Diet: Regular Diet  DVT PPx: SCDs   GI PPx: PPI  Electrolytes: No Repletion Needed  Dispo: Admit to MDW and Continue Routine Care    Subjective:   Patient appears to be more confused and is slower to answer questions this morning. She states that she has continued RUQ abdominal pain and does not feel well.     Objective:   Temp:  [36.7 ??C-36.9 ??C] 36.8 ??C  Heart Rate:  [75-89] 76  Resp:  [18-20] 18  BP: (116-146)/(42-60) 138/42  SpO2:  [91 %-100 %] 97 %    Gen: Chronically ill-appearing, appears older than stated age. Slow to respond to questions.   HEENT: atraumatic, sclera anicteric, MMM. OP w/o erythema or exudate   Heart: RRR, S1, S2, no M/R/G, no chest wall tenderness  Lungs: CTAB, no crackles or wheezes, no use of accessory muscles  Abdomen: Normoactive bowel sounds, soft, NTND, no rebound/guarding. No appreciable fluid wave on exam.   Extremities: no clubbing, cyanosis. 2+ pitting edema to bilateral lower extremities. Subtle asterixis on exam  Neuro: a&o x3. She appears somnolent     Labs/Studies: Labs and Studies from the last 24hrs per EMR and Reviewed

## 2019-10-09 NOTE — Unmapped (Signed)
Problem: Adult Inpatient Plan of Care  Goal: Plan of Care Review  Outcome: Progressing  Goal: Patient-Specific Goal (Individualization)  Outcome: Progressing  Goal: Absence of Hospital-Acquired Illness or Injury  Outcome: Progressing  Goal: Optimal Comfort and Wellbeing  Outcome: Progressing  Goal: Readiness for Transition of Care  Outcome: Progressing  Goal: Rounds/Family Conference  Outcome: Progressing  Patient is stable. Possible discharge tomorrow ( 1/222/2021).

## 2019-10-10 LAB — COMPREHENSIVE METABOLIC PANEL
ALBUMIN: 3 g/dL — ABNORMAL LOW (ref 3.5–5.0)
ALT (SGPT): 39 U/L — ABNORMAL HIGH (ref <35)
ANION GAP: 1 mmol/L — ABNORMAL LOW (ref 7–15)
AST (SGOT): 72 U/L — ABNORMAL HIGH (ref 14–38)
BILIRUBIN TOTAL: 5.9 mg/dL — ABNORMAL HIGH (ref 0.0–1.2)
BLOOD UREA NITROGEN: 34 mg/dL — ABNORMAL HIGH (ref 7–21)
BUN / CREAT RATIO: 25
CALCIUM: 11.6 mg/dL — ABNORMAL HIGH (ref 8.5–10.2)
CHLORIDE: 90 mmol/L — ABNORMAL LOW (ref 98–107)
CO2: 37 mmol/L — ABNORMAL HIGH (ref 22.0–30.0)
CREATININE: 1.36 mg/dL — ABNORMAL HIGH (ref 0.60–1.00)
EGFR CKD-EPI AA FEMALE: 47 mL/min/{1.73_m2} — ABNORMAL LOW (ref >=60)
GLUCOSE RANDOM: 169 mg/dL (ref 70–179)
POTASSIUM: 3 mmol/L — ABNORMAL LOW (ref 3.5–5.0)
PROTEIN TOTAL: 6.1 g/dL — ABNORMAL LOW (ref 6.5–8.3)
SODIUM: 128 mmol/L — ABNORMAL LOW (ref 135–145)

## 2019-10-10 LAB — URINALYSIS
BILIRUBIN UA: NEGATIVE
BLOOD UA: NEGATIVE
GLUCOSE UA: NEGATIVE
KETONES UA: NEGATIVE
LEUKOCYTE ESTERASE UA: NEGATIVE
NITRITE UA: NEGATIVE
PH UA: 6 (ref 5.0–9.0)
PROTEIN UA: NEGATIVE
RBC UA: <1 /HPF (ref <=4)
SPECIFIC GRAVITY UA: 1.008 (ref 1.003–1.030)
SQUAMOUS EPITHELIAL: 2 /HPF (ref 0–5)
UROBILINOGEN UA: 0.2
WBC UA: <1 /HPF (ref 0–5)

## 2019-10-10 LAB — INR: Coagulation tissue factor induced.INR:RelTime:Pt:PPP:Qn:Coag: 1.38

## 2019-10-10 LAB — CBC
HEMATOCRIT: 30.1 % — ABNORMAL LOW (ref 36.0–46.0)
HEMOGLOBIN: 9.7 g/dL — ABNORMAL LOW (ref 12.0–16.0)
MEAN CORPUSCULAR HEMOGLOBIN CONC: 32.3 g/dL (ref 31.0–37.0)
MEAN CORPUSCULAR HEMOGLOBIN: 26.3 pg (ref 26.0–34.0)
MEAN CORPUSCULAR VOLUME: 81.4 fL (ref 80.0–100.0)
MEAN PLATELET VOLUME: 13.3 fL — ABNORMAL HIGH (ref 7.0–10.0)
PLATELET COUNT: 52 10*9/L — ABNORMAL LOW (ref 150–440)
RED CELL DISTRIBUTION WIDTH: 22.3 % — ABNORMAL HIGH (ref 12.0–15.0)
WBC ADJUSTED: 4.8 10*9/L (ref 4.5–11.0)

## 2019-10-10 LAB — PARATHYROID HOMONE (PTH): PARATHYROID HORMONE INTACT: 51.9 pg/mL (ref 12.0–72.0)

## 2019-10-10 LAB — MAGNESIUM: Magnesium:MCnc:Pt:Ser/Plas:Qn:: 1.8

## 2019-10-10 LAB — THYROID STIMULATING HORMONE: Thyrotropin:ACnc:Pt:Ser/Plas:Qn:: 1.745

## 2019-10-10 LAB — PHOSPHORUS
PHOSPHORUS: 4.1 mg/dL (ref 2.9–4.7)
Phosphate:MCnc:Pt:Ser/Plas:Qn:: 4.1

## 2019-10-10 LAB — FREE T4: Thyroxine.free:MCnc:Pt:Ser/Plas:Qn:: 2.44 — ABNORMAL HIGH

## 2019-10-10 LAB — SODIUM: Sodium:SCnc:Pt:Ser/Plas:Qn:: 128 — ABNORMAL LOW

## 2019-10-10 LAB — T3 FREE: Triiodothyronine.free:MCnc:Pt:Ser/Plas:Qn:: 3.94

## 2019-10-10 LAB — MEAN CORPUSCULAR HEMOGLOBIN: Erythrocyte mean corpuscular hemoglobin:EntMass:Pt:RBC:Qn:Automated count: 26.3

## 2019-10-10 LAB — PARATHYROID HORMONE INTACT: Parathyrin.intact:MCnc:Pt:Ser/Plas:Qn:: 51.9

## 2019-10-10 LAB — SQUAMOUS EPITHELIAL: Lab: 2

## 2019-10-10 NOTE — Unmapped (Signed)
Opened in error

## 2019-10-10 NOTE — Unmapped (Signed)
HEPATOLOGY INPATIENT FOLLOW UP NOTE     Requesting Attending Physician:  Donnal Moat, *    Reason for Consult:  [phone] w/hx of NAFLD cirrhosis (d/b ascites, HE, EV bleeding), s/p TIPS 06/2018 and embolization of mesocaval varices, w/subsequent worsening HE, also c/b GAVE requiring multiple episodes APC, who was admitted w/AMS and melena.     EGD with 1/19 w/PHG, duodenopathy and severe GAVE, treated with APC. TIPS ultrasound showed elevated velocities c/w mild stenosis but without significant step off.     Seen in consultation at the request of Dr. Donnal Moat, * for encephalopathy    Interval History:  - Yesterday afternoon and today, patient is more encephalopathic, slow to answer questions, c/w how she appeared when she came in.   - Had 8 BMs on 1/20, so decreased lactulose --> 2 BMs 1/21, so increased BMs  - Her labs look worse too, bilirubin 5.9, AKI to Cr 1.36  - Hypercalcemic to 11.6, HypoNa 128, ?volume down?  -Reports feeling weak      ASSESSMENT / PLAN:     #Encephalopathy: Likely hepatic encephalopathy, which could have several etiologies including infection, hypercalcemia, dehydration in the setting of a bowel movements 2 days ago, potentially in the setting of decreasing her lactulose yesterday, and maybe overall has brittle mental status in the setting of her TIPS, although TIPS if anything has slight stenosis.  Less likely GI bleed given stable hemoglobin, and recent EGD with APC to gave and no significant bleeding  Plan:  -Infectious work-up including chest x-ray, blood cultures, UA, and assess for ascites.  If has ascites present, would send for diagnostic paracentesis including albumin, total protein, cell count, culture.  -Evaluate hypercalcemia, including sending PTH, vitamin D level.  Ultimately defer evaluation of hypercalcemia to primary team.  -Continue lactulose 3 times daily, goal 4-5 bowel movements daily    #AKI: Differential includes infection related-AKI, or dehydration in the setting of poor p.o. intake, less likely HRS in the setting of TIPS and minimal ascites..  Plan  -Please hold diuretics, including metolazone and spironolactone  -Albumin 1 g per/kg  - Strict Is/Os  - Continue low Na diet  -Could check urine lytes tomorrow off diuretics if Cr still rising    Please page [phone] for further questions/concerns. Patient seen and discussed with Dr. Gershon Mussel, MD  Gastroenterology Fellow, PGY-4  [facility]           Medications:  Current Facility-Administered Medications   Medication Dose Route Frequency Provider Last Rate Last Admin   ??? atorvastatin (LIPITOR) tablet 10 mg  10 mg Oral Nightly Damien Fusi, MD   10 mg at 10/09/19 2054   ??? busPIRone (BUSPAR) tablet 10 mg  10 mg Oral BID Damien Fusi, MD   10 mg at 10/10/19 0981   ??? dextrose 50 % in water (D50W) 50 % solution 12.5 g  12.5 g Intravenous Q10 Min PRN Damien Fusi, MD       ??? insulin glargine (LANTUS) injection 55 Units  55 Units Subcutaneous Daily Hinda Lenis, MD   55 Units at 10/10/19 0854   ??? insulin lispro (HumaLOG) injection 0-12 Units  0-12 Units Subcutaneous ACHS Damien Fusi, MD   8 Units at 10/10/19 [id number]   ??? lactated ringers bolus 1,000 mL  1,000 mL Intravenous Once Sallyanne Kuster, MD       ??? lactated Ringers infusion  10 mL/hr Intravenous Continuous Lloyd Huger  Ladaisha Fetter, MD   Stopped at 10/09/19 0813   ??? lactulose (CHRONULAC) oral solution (30 mL cup)  30 g Oral TID Hinda Lenis, MD       ??? levothyroxine (SYNTHROID) tablet 25 mcg  25 mcg Oral Daily Damien Fusi, MD   25 mcg at 10/10/19 0935   ??? meTOLAzone (ZAROXOLYN) tablet 5 mg  5 mg Oral Daily Damien Fusi, MD   5 mg at 10/10/19 0935   ??? pantoprazole (PROTONIX) EC tablet 40 mg  40 mg Oral BID Hinda Lenis, MD   40 mg at 10/10/19 0853   ??? potassium chloride 20 mEq in 100 mL IVPB Premix  20 mEq Intravenous Q2H Maya Mendelaw, MD       ??? rifAXIMin (XIFAXAN) tablet 550 mg  550 mg Oral BID Damien Fusi, MD   550 mg at 10/10/19 1610   ??? sertraline (ZOLOFT) tablet 50 mg  50 mg Oral Daily Damien Fusi, MD   50 mg at 10/10/19 9604   ??? simethicone (MYLICON) chewable tablet 80 mg  80 mg Oral Q6H PRN Hinda Lenis, MD       ??? spironolactone (ALDACTONE) tablet 100 mg  100 mg Oral Daily Damien Fusi, MD   100 mg at 10/10/19 0935       Vital Signs:  Temp:  [36.3 ??C-36.8 ??C] 36.3 ??C  Heart Rate:  [78-85] 80  Resp:  [18] 18  BP: (128-146)/(40-54) 132/40  MAP (mmHg):  [62-76] 63  SpO2:  [91 %-98 %] 97 %    Intake/Output last 3 shifts:  I/O last 3 completed shifts:  In: 168 [P.O.:118; IV Piggyback:50]  Out: 450 [Urine:450]    Physical Exam:  General appearance: Appears chronically ill  HEENT: Icteric sclera sclera  Cardiovascular: Regular rate  Pulmonary: Normal work of breathing. Acyanotic  Abdominal: soft, mild distention, no fluid wave, no masses or organomegaly.  Musculoskeletal: No temporal wasting. bilateral pitting edema of LEs  Skin: + Jaundice  Neurologic: Alert, oriented to place/year, did not know date. Slow to answer questions.  Psychiatric: Appropriate.  ??    Diagnostic Studies:   Labs:  Recent Labs     10/08/19  0411 10/09/19  0627 10/10/19  0623   WBC 2.4* 3.4* 4.8   HGB 8.5* 9.5* 9.7*   HCT 27.2* 29.3* 30.1*   PLT 47* 52* 52*     Recent Labs     10/08/19  0411 10/09/19  0627 10/10/19  0623   NA 134* 131* 128*   K 3.0* 3.3* 3.0*   CL 97* 91* 90*   BUN 28* 28* 34*   CREATININE 1.09* 1.26* 1.36*   GLU 272* 187* 169     Recent Labs     10/08/19  0411 10/09/19  0627 10/10/19  0623   PROT 5.0* 5.9* 6.1*   ALBUMIN 2.4* 2.8* 3.0*   AST 43* 61* 72*   ALT 22 33 39*   ALKPHOS 84 105 116   BILITOT 2.7* 4.7* 5.9*     Recent Labs     10/08/19  0411 10/09/19  0627 10/10/19  0623   INR 1.58 1.56 1.38

## 2019-10-10 NOTE — Unmapped (Signed)
Limited Abdominal Ultrasound (CPT [phone])    Indication: Fluid search for paracentesis    Findings:  RUQ: no free fluid, hepatorenal junction identified        RLQ: no free fluid, dynamic bowel        LUQ: splenorenal junction identified, no free fluid noted        LLQ: no free fluid noted, dynamic bowel         Impression: Free fluid absent in LLQ, LUQ, RLQ and RUQ. No appropriate site seen for sampling.    Recommendation:  - ongoing evaluation of AMS    I personally performed the ultrasound and interpretation at the bedside.  Marilyn French  October 10, 2019 3:47 PM

## 2019-10-10 NOTE — Unmapped (Signed)
Patient alert and oriented x4. Delayed responses and intermittent, confusion. VSS throughout shift. Pt in bed with bed in low and locked position and call bell in reach. Bed alarm on. No neuro changes noted. No falls during this shift. Pt denies any pain. CHest port intact.  Will continue to monitor.     Problem: Adult Inpatient Plan of Care  Goal: Plan of Care Review  Outcome: Progressing  Goal: Patient-Specific Goal (Individualization)  Outcome: Progressing  Goal: Absence of Hospital-Acquired Illness or Injury  Outcome: Progressing  Goal: Optimal Comfort and Wellbeing  Outcome: Progressing  Goal: Readiness for Transition of Care  Outcome: Progressing  Goal: Rounds/Family Conference  Outcome: Progressing

## 2019-10-10 NOTE — Unmapped (Signed)
Reason for call: Assist coordinating paper work needed for renewal of MFR assistance for Xifaxan    I called and spoke with pt's husband Casimiro Needle concerning proof of financial documentation needed for renewal of MFR assistance for Xifxan. Pt is currently admitted to [facility]. Casimiro Needle thought this had been renewed already for 2021 as they just received a new shipment. Notified pt's husband that the renewal has not been approved for 2021. Keyana with [employer] team called to double check renewal for 2021, but nothing has been processed with the [employer] system. We still do not have an approval for 2021, and the company did send out additional fills for renewal pt's to help during the renewal process to ensure they are not without their medication. Casimiro Needle requested a new envelope be sent to his home address and he will send the paper work in right away. Notified Keyana with the MAP team and she will send out a new envelope but expressed this will be the 3rd one send. I will follow up with pt and her husband in 2 wks to ensure they received it.       Vertell Limber RN, BSN  Nursing Care Coordinator   Pharmacy Adult GI Medicine  [facility]  [address]   [place], [place] [address]  [phone]

## 2019-10-10 NOTE — Unmapped (Signed)
Internal Medicine (MDW) Progress Note    Assessment & Plan:   Marilyn French is a 64 y.o. female with history of NASH cirrhosis s/p TIPS c/b worsening HE, prior variceal bleed, GAVE, HTN, pHTN, anxiety, chronic microcytic anemia, DM, and recent OSH admission for melena who presents with ongoing melena and worsening encephalopathy c/f GI bleed.    Principal Problem:    Upper GI bleed  Active Problems:    NAFLD (nonalcoholic fatty liver disease)    Cirrhosis (CMS-HCC)    GAVE (gastric antral vascular ectasia)    Hypothyroid    Hepatic encephalopathy (CMS-HCC)    Iron deficiency anemia  Resolved Problems:    * No resolved hospital problems. *      Hepatic Encephalopathy: While there was a period of improvement in mental status on 1/20 immediately post-operatively, her mental status has been worsening since 1/21. She has continued confusion and is very slow to answer questions this morning (1/22). Concern for possible infection even though she has been afebrile. WBC within normal limits, but are elevated this morning from her baseline. Abdomen flat with no obvious fluid wave on exam and no obvious swelling in lower extremities.   - Initiate infectious work-up with CXR and UA    - Will re-engage hepatology for any additional recommendations given this new worsening in clinical status  - Low suspicion for SBP, but will consult med M for possible diagnostic paracentesis.   - Increase lactulose to 300 mg QID - titrate to 3-5  BMs daily as she's had 8 stools in the last 24 hours.   - Continue Rifaxan       Melena, Microcytic Anemia: Ongoing problem for her in the past but reports worsening melena that prompted presentation to OSH. Initial hemoglobin there was 5.8 on 1/11 and she ultimately req'd 4U RBCs over the course of a 5 day admission. Still reports ongoing melena. Hemoglobin now stable at 8.5, up from 7.7 as early as morning of Marilyn French presentation (albeit on different equipment). No evidence for brisk bleed and low concern for variceal issue given TIPS and clinical stability. Most likely related to GAVE or other upper GI etiology. Start IV PPI and lieu of oral PPI, start ceftriaxone PPx, hold off on octreotide. Platelets 46 at time of presentation, hold transfusion given unclear if active bleed and these are actually higher than she was at OSH (20s-30s).  - See hepatology recs as above  - Hb q12h  - DIC profile  - IV ppi BID     NASH Cirrhosis s/p TIPS: Meld actually lower today at 17 and most recent clinic visit with hepatology in October last year. Has been turned down twice for LFT. Follows with Dr. Raford French. Overall appears well compensated other than encephalopathy and with volume status actually much improved per patient and discharge summary from OSH; down 12 lbs since presentation on 1/11 with IV diuresis and was transitioned to home bumex 2 mg BID in addition to new-start metolazone 5 mg daily. Reach out to hepatology in the morning. Patient having multiple BMs since yesterday. No obvious hematochezia or melena noted.   - Continue diuretics: bumex 2 mg BID, aldactone 100 mg daily, metolazone 5 mg daily  - 20mg  lactulose QID. Titrate to 3-5 BMs per day  ??  MELD-Na score: 17 at 10/05/2019  5:08 PM  MELD score: 17 at 10/05/2019  5:08 PM  Calculated from:  Serum Creatinine: 1.03 mg/dL at 6/64/4034  7:42 PM  Serum Sodium: 138 mmol/L (Rounded  to 137 mmol/L) at 10/05/2019  5:08 PM  Total Bilirubin: 3.1 mg/dL at 1/61/0960  4:54 PM  INR(ratio): 1.72 at 10/05/2019  5:08 PM  Age: 7 years 4 months  ??  Elevated APTT: Initial concern for APTT of 225 in ED. Patient received one dose of IV vitamin K. DIC profile returned with aPTT 33.   - trend aPTT   -Daily DIC profile   ??  DM: Was receiving lantus 55U in AM, 10U in PM at OSH; give 45U at bedtime (prior home dose) in setting of poor PO intake. Sugars with continued elevation overnight and this morning.   - Increase lantus 55U at bedtime  - SSI  ??  Anxiety/Depression: Home Buspar, zoloft Hypothyroidism: home synthroid  HLD: crestor --> liptor per formulary  ??  Code status confirmed full code on arrival and with husband as healthcare proxy.   ??  Daily Checklist:  Diet: Regular Diet  DVT PPx: SCDs   GI PPx: PPI  Electrolytes: No Repletion Needed  Dispo: Admit to MDW and Continue Routine Care    Subjective:   Patient appears increasingly altered compared to yesterday. She is extremely slow to respond to questions and appears generally unwell. She complains of abdominal pain. No new urinary complaints or shortness of breath.      Objective:   Temp:  [36.3 ??C-36.9 ??C] 36.3 ??C  Heart Rate:  [78-86] 80  Resp:  [18] 18  BP: (128-148)/(40-54) 132/40  SpO2:  [91 %-98 %] 97 %    Gen: Chronically ill-appearing, appears older than stated age. Slow to respond to questions.   HEENT: atraumatic, sclera anicteric, MMM. OP w/o erythema or exudate   Heart: RRR, S1, S2, no M/R/G, no chest wall tenderness  Lungs: CTAB, no crackles or wheezes, no use of accessory muscles  Abdomen: Normoactive bowel sounds, soft, NTND, no rebound/guarding. No appreciable fluid wave on exam.   Extremities: no clubbing, cyanosis. 2+ pitting edema to bilateral lower extremities. Subtle asterixis on exam  Neuro: a&o x3. She appears somnolent     Labs/Studies: Labs and Studies from the last 24hrs per EMR and Reviewed

## 2019-10-10 NOTE — Unmapped (Addendum)
Marilyn French??is a 64 y.o.??female??with history of NASH cirrhosis s/p TIPS c/b worsening HE, prior variceal bleed, GAVE, HTN, pHTN, anxiety, chronic microcytic anemia, DM, and recent OSH admission for melena who presents with ongoing melena and worsening encephalopathy c/f GI bleed s/p EGD and c scope with APC, hemoglobin now stable at 8.2  ??  Hepatic Encephalopathy:??Patient was significantly encephalopathic upon admission. She was very lethargic and  very slow to answer questions. Initial oncern for possible infection but infectious work-up negative (blood and urine, no ascites present). Major depression might be a significant contributor to her slowed speech and poor functional status. Additionally, her liver disease may frankly be worsening as suggested by her current MELD. Mental status slowly improved over the course of admission with aggressive lactulose (40mg  QID) and rifaximin.   ??  Melena - Microcytic Anemia:??Ongoing problem for her in the past but reports worsening melena that prompted presentation to OSH.??Initial hemoglobin there was 5.8??on 1/11??and she ultimately??req'd 4U RBCs over the course of a 5 day admission.??She is s/p EGD on 1/19, APC to GAVE at that time. Hemoglobin was still lower than her baseline and it was decided that she would benefit from a c-scope given known history of colonic AVMs as seen on 2017 scope. She underwent c-scope 1/29 with APC to three colonic AVMs. On 1/30, hgb dropped from 7.1 to 6.1 with no obvious signs of bleeding, most likely from post-procedural oozing. On 1/31, some black stool overnight concerning for upper GI source, may be recurrent from known GAVE. Hemoglobin remained stable from 2/1 to 2/2. Hgb 8.2 on day of discharge.  ??  AKI - Electrolyte derangements: On 1/23, it was noted that the patient's creatinine rose to 1.64 from a baseline ~1. Also with hypercalcemia to 11. Vitamin D and ionized calcium wnl. Thyroid studies wnl. PTN wnl. Thought to be 2/2 hypovolemia with poor PO intake. She received multiple days of 1g/kg albumin and creatinine improved slowly back to baseline by 1/28. Diuretics were held.    Anxiety/Depression:??Pt endorses major sadness and depressive symptoms in regards to her poor condition and low candidacy for transplant. Offered to have palliative care team to come by to discuss goals of care going forward should she need to be hospitalized again in the future. Patient is quite resistant to this and states that she really would rather not have that conversation during this admission. Home buspar was continued and home zoloft was increased to 100mg  daily.    ??  NASH Cirrhosis s/p TIPS:??Follows with Dr. Raford Pitcher.??Overall appears well compensated other than encephalopathy and??with volume status actually much improved per patient and discharge summary from OSH; down??12 lbs since presentation on 1/11 with IV diuresis and was transitioned to home bumex 2 mg BID in addition to new-start metolazone 5 mg daily. No obvious hematochezia or melena noted. Diuretics (bumex 2 mg BID, aldactone 100 mg daily, metolazone 5 mg daily) were held per hepatology during admission. Plan to discharge home on bumex 2mg  daily and spiro 100mg  daily. She will obtain labs in 1 week at [facility].  ??  DM: Was receiving lantus 55U in AM, 10U in PM at OSH. Transitioned to lantus 60U with SSI during admission.  ??  Hypothyroidism:??home synthroid    HLD:??crestor -->??lipitor per formulary

## 2019-10-11 LAB — FERRITIN: Ferritin:MCnc:Pt:Ser/Plas:Qn:: 16.3

## 2019-10-11 LAB — CBC
HEMATOCRIT: 22.1 % — ABNORMAL LOW (ref 36.0–46.0)
MEAN CORPUSCULAR HEMOGLOBIN: 27.4 pg (ref 26.0–34.0)
MEAN CORPUSCULAR VOLUME: 84.5 fL (ref 80.0–100.0)
MEAN PLATELET VOLUME: 13.3 fL — ABNORMAL HIGH (ref 7.0–10.0)
PLATELET COUNT: 43 10*9/L — ABNORMAL LOW (ref 150–440)
RED BLOOD CELL COUNT: 2.61 10*12/L — ABNORMAL LOW (ref 4.00–5.20)
RED CELL DISTRIBUTION WIDTH: 23.9 % — ABNORMAL HIGH (ref 12.0–15.0)
WBC ADJUSTED: 2 10*9/L — ABNORMAL LOW (ref 4.5–11.0)

## 2019-10-11 LAB — COMPREHENSIVE METABOLIC PANEL
ALBUMIN: 3 g/dL — ABNORMAL LOW (ref 3.5–5.0)
ALKALINE PHOSPHATASE: 69 U/L (ref 38–126)
ANION GAP: 7 mmol/L (ref 7–15)
AST (SGOT): 68 U/L — ABNORMAL HIGH (ref 14–38)
BILIRUBIN TOTAL: 3.5 mg/dL — ABNORMAL HIGH (ref 0.0–1.2)
BLOOD UREA NITROGEN: 39 mg/dL — ABNORMAL HIGH (ref 7–21)
BUN / CREAT RATIO: 24
CALCIUM: 10.6 mg/dL — ABNORMAL HIGH (ref 8.5–10.2)
CHLORIDE: 90 mmol/L — ABNORMAL LOW (ref 98–107)
CREATININE: 1.64 mg/dL — ABNORMAL HIGH (ref 0.60–1.00)
EGFR CKD-EPI AA FEMALE: 38 mL/min/{1.73_m2} — ABNORMAL LOW (ref >=60)
EGFR CKD-EPI NON-AA FEMALE: 33 mL/min/{1.73_m2} — ABNORMAL LOW (ref >=60)
GLUCOSE RANDOM: 393 mg/dL — ABNORMAL HIGH (ref 70–179)
POTASSIUM: 3 mmol/L — ABNORMAL LOW (ref 3.5–5.0)
PROTEIN TOTAL: 5.4 g/dL — ABNORMAL LOW (ref 6.5–8.3)
SODIUM: 129 mmol/L — ABNORMAL LOW (ref 135–145)

## 2019-10-11 LAB — RETICULOCYTES: RETIC HGB CONTENT: 36.9 pg — ABNORMAL HIGH (ref 29.7–36.1)

## 2019-10-11 LAB — PARATHYROID HOMONE (PTH): CALCIUM: 11.6 mg/dL — ABNORMAL HIGH (ref 8.5–10.2)

## 2019-10-11 LAB — IRON PANEL: TOTAL IRON BINDING CAPACITY (CALC): 236.6 mg/dL — ABNORMAL LOW (ref 252.0–479.0)

## 2019-10-11 LAB — HEMOGLOBIN: Hemoglobin:MCnc:Pt:Bld:Qn:: 6.2 — ABNORMAL LOW

## 2019-10-11 LAB — PARATHYROID HORMONE INTACT: Parathyrin.intact:MCnc:Pt:Ser/Plas:Qn:: 33.2

## 2019-10-11 LAB — OSMOLALITY MEASURED: Osmolality:Osmol:Pt:Ser/Plas:Qn:: 299 — ABNORMAL HIGH

## 2019-10-11 LAB — WBC ADJUSTED: Leukocytes:NCnc:Pt:Bld:Qn:: 2 — ABNORMAL LOW

## 2019-10-11 LAB — ANION GAP: Anion gap 3:SCnc:Pt:Ser/Plas:Qn:: 7

## 2019-10-11 LAB — IRON: Iron:MCnc:Pt:Ser/Plas:Qn:: 27 — ABNORMAL LOW

## 2019-10-11 LAB — MAGNESIUM: Magnesium:MCnc:Pt:Ser/Plas:Qn:: 2.1

## 2019-10-11 LAB — INR: Coagulation tissue factor induced.INR:RelTime:Pt:PPP:Qn:Coag: 1.71

## 2019-10-11 LAB — PHOSPHORUS: Phosphate:MCnc:Pt:Ser/Plas:Qn:: 2.8 — ABNORMAL LOW

## 2019-10-11 LAB — OSMOLALITY, SERUM: OSMOLALITY MEASURED: 299 mosm/kg — ABNORMAL HIGH (ref 275–295)

## 2019-10-11 LAB — RETICULOCYTE ABSOLUTE COUNT: Lab: 130.7 — ABNORMAL HIGH

## 2019-10-11 NOTE — Unmapped (Signed)
Marilyn French's VSS on RA, afebrile. PAC C/D/I, infusing, new dressing and needle done today. Pt. Is not complaining of pain or nausea. Pt. Ate and drank well today. ACHS checks were complete. Pt. Up voiding and had BM x1 noted. Pt. Resting well in bed, slightly in and out of confusion all day. No family at bedside, daughter called for updates, cares complete. Will continue to monitor.     Problem: Adult Inpatient Plan of Care  Goal: Plan of Care Review  Outcome: Ongoing - Unchanged  Goal: Patient-Specific Goal (Individualization)  Outcome: Ongoing - Unchanged  Goal: Absence of Hospital-Acquired Illness or Injury  Outcome: Ongoing - Unchanged  Goal: Optimal Comfort and Wellbeing  Outcome: Ongoing - Unchanged  Goal: Readiness for Transition of Care  Outcome: Ongoing - Unchanged  Goal: Rounds/Family Conference  Outcome: Ongoing - Unchanged     Problem: Fall Injury Risk  Goal: Absence of Fall and Fall-Related Injury  Outcome: Ongoing - Unchanged     Problem: Self-Care Deficit  Goal: Improved Ability to Complete Activities of Daily Living  Outcome: Ongoing - Unchanged     Problem: Skin Injury Risk Increased  Goal: Skin Health and Integrity  Outcome: Ongoing - Unchanged     Problem: Diabetes Comorbidity  Goal: Blood Glucose Level Within Desired Range  Outcome: Ongoing - Unchanged

## 2019-10-11 NOTE — Unmapped (Signed)
HEPATOLOGY INPATIENT FOLLOW UP NOTE     Requesting Attending Physician:  Donnal Moat, *    Reason for Consult:  [phone] w/hx of NAFLD cirrhosis (d/b ascites, HE, EV bleeding), s/p TIPS 06/2018 and embolization of mesocaval varices, w/subsequent worsening HE, also c/b GAVE requiring multiple episodes APC, who was admitted w/AMS and melena.     EGD with 1/19 w/PHG, duodenopathy and severe GAVE, treated with APC. TIPS ultrasound showed elevated velocities c/w mild stenosis but without significant step off.     Seen in consultation at the request of Dr. Donnal Moat, * for encephalopathy    Interval History:  - Yesterday, patient's diuretics were held, 450 cc urine is recorded in output, 3 bowel movements.  Bedside ultrasound without any tappable pocket, essentially no ascites.  Vitals are notable for no fevers, normal heart rate, blood pressure was normal, however this morning is 101/30, the lowest it has been.  She did get 1 g per kg of albumin yesterday.  PTH was 51.  - Today labs have not been drawn (yest bilirubin 5.9, AKI to Cr 1.36, 11.6, HypoNa 128)  - This AM, feels work, endorses congestion.      ASSESSMENT / PLAN:     #Encephalopathy: Likely hepatic encephalopathy, which could have several etiologies including infection, hypercalcemia, dehydration in the setting of a bowel movements 2 days ago, potentially in the setting of decreasing her lactulose yesterday, and maybe overall has brittle mental status in the setting of her TIPS, although TIPS if anything has slight stenosis.  Less likely GI bleed given stable hemoglobin, and recent EGD with APC to gave and no significant bleeding  Plan:  -Infectious work-up including chest x-ray, blood cultures, UA, and assess for ascites.  If has ascites present, would send for diagnostic paracentesis including albumin, total protein, cell count, culture.  -Evaluate hypercalcemia, including sending PTH, vitamin D level.  Ultimately defer evaluation of hypercalcemia to primary team.  -Continue lactulose 3 times daily, goal 4-5 bowel movements daily    #AKI: Differential includes infection related-AKI, or dehydration in the setting of poor p.o. intake, less likely HRS in the setting of TIPS and minimal ascites..  Plan  -Please hold diuretics, including metolazone and spironolactone  - Check urine lytes  -Albumin 1 g per/kg again today  - Strict Is/Os  - Continue low Na diet    Please page [phone] for further questions/concerns. Patient seen and discussed with Dr. Gershon Mussel, MD  Gastroenterology Fellow, PGY-4  [facility]           Medications:  Current Facility-Administered Medications   Medication Dose Route Frequency Provider Last Rate Last Admin   ??? atorvastatin (LIPITOR) tablet 10 mg  10 mg Oral Nightly Damien Fusi, MD   10 mg at 10/10/19 2050   ??? busPIRone (BUSPAR) tablet 10 mg  10 mg Oral BID Damien Fusi, MD   10 mg at 10/10/19 2050   ??? dextrose 50 % in water (D50W) 50 % solution 12.5 g  12.5 g Intravenous Q10 Min PRN Damien Fusi, MD       ??? insulin glargine (LANTUS) injection 55 Units  55 Units Subcutaneous Daily Hinda Lenis, MD   55 Units at 10/10/19 0854   ??? insulin lispro (HumaLOG) injection 0-12 Units  0-12 Units Subcutaneous ACHS Damien Fusi, MD   6 Units at 10/10/19 2103   ??? lactated Ringers infusion  10 mL/hr Intravenous Continuous  Pia Mau, MD   Stopped at 10/09/19 [phone]   ??? lactulose (CHRONULAC) oral solution (30 mL cup)  30 g Oral TID Hinda Lenis, MD   30 g at 10/10/19 2051   ??? levothyroxine (SYNTHROID) tablet 25 mcg  25 mcg Oral Daily Damien Fusi, MD   25 mcg at 10/10/19 0935   ??? pantoprazole (PROTONIX) EC tablet 40 mg  40 mg Oral BID Hinda Lenis, MD   40 mg at 10/10/19 2050   ??? rifAXIMin (XIFAXAN) tablet 550 mg  550 mg Oral BID Damien Fusi, MD   550 mg at 10/10/19 2050   ??? sertraline (ZOLOFT) tablet 50 mg  50 mg Oral Daily Damien Fusi, MD   50 mg at 10/10/19 9604   ??? simethicone (MYLICON) chewable tablet 80 mg  80 mg Oral Q6H PRN Hinda Lenis, MD           Vital Signs:  Temp:  [36.3 ??C-37 ??C] 37 ??C  Heart Rate:  [77-83] 83  Resp:  [18] 18  BP: (101-142)/(30-52) 101/30  MAP (mmHg):  [59-72] 59  SpO2:  [91 %-97 %] 92 %    Intake/Output last 3 shifts:  I/O last 3 completed shifts:  In: 428 [P.O.:378; IV Piggyback:50]  Out: 450 [Urine:450]    Physical Exam:  General appearance: Appears chronically ill  HEENT: Icteric sclera sclera  Cardiovascular: Regular rate  Pulmonary: Normal work of breathing. Acyanotic  Abdominal: soft, mild distention, no fluid wave, no masses or organomegaly.  Musculoskeletal: No temporal wasting. bilateral pitting edema of LEs  Skin: + Jaundice  Neurologic: Alert, oriented to place/year, did not know date. Slow to answer questions.  Psychiatric: Appropriate.  ??    Diagnostic Studies:   Labs:  Recent Labs     10/09/19  0627 10/10/19  0623   WBC 3.4* 4.8   HGB 9.5* 9.7*   HCT 29.3* 30.1*   PLT 52* 52*     Recent Labs     10/09/19  0627 10/10/19  0623   NA 131* 128*   K 3.3* 3.0*   CL 91* 90*   BUN 28* 34*   CREATININE 1.26* 1.36*   GLU 187* 169     Recent Labs     10/09/19  0627 10/10/19  0623   PROT 5.9* 6.1*   ALBUMIN 2.8* 3.0*   AST 61* 72*   ALT 33 39*   ALKPHOS 105 116   BILITOT 4.7* 5.9*     Recent Labs     10/09/19  0627 10/10/19  0623   INR 1.56 1.38

## 2019-10-12 LAB — ADDON DIFFERENTIAL ONLY
BASOPHILS ABSOLUTE COUNT: 0 10*9/L (ref 0.0–0.1)
BASOPHILS RELATIVE PERCENT: 0.4 %
EOSINOPHILS ABSOLUTE COUNT: 0 10*9/L (ref 0.0–0.4)
EOSINOPHILS RELATIVE PERCENT: 1.1 %
LYMPHOCYTES RELATIVE PERCENT: 30.1 %
MONOCYTES ABSOLUTE COUNT: 0.2 10*9/L (ref 0.2–0.8)
MONOCYTES RELATIVE PERCENT: 14 %
NEUTROPHILS ABSOLUTE COUNT: 0.8 10*9/L — ABNORMAL LOW (ref 2.0–7.5)
NEUTROPHILS RELATIVE PERCENT: 50.5 %

## 2019-10-12 LAB — COMPREHENSIVE METABOLIC PANEL
ALBUMIN: 3.2 g/dL — ABNORMAL LOW (ref 3.5–5.0)
ALKALINE PHOSPHATASE: 71 U/L (ref 38–126)
ANION GAP: 5 mmol/L — ABNORMAL LOW (ref 7–15)
AST (SGOT): 65 U/L — ABNORMAL HIGH (ref 14–38)
BILIRUBIN TOTAL: 2.4 mg/dL — ABNORMAL HIGH (ref 0.0–1.2)
BLOOD UREA NITROGEN: 50 mg/dL — ABNORMAL HIGH (ref 7–21)
BUN / CREAT RATIO: 25
CO2: 30 mmol/L (ref 22.0–30.0)
CREATININE: 2.02 mg/dL — ABNORMAL HIGH (ref 0.60–1.00)
EGFR CKD-EPI AA FEMALE: 29 mL/min/{1.73_m2} — ABNORMAL LOW (ref >=60)
EGFR CKD-EPI NON-AA FEMALE: 26 mL/min/{1.73_m2} — ABNORMAL LOW (ref >=60)
GLUCOSE RANDOM: 201 mg/dL — ABNORMAL HIGH (ref 70–179)
POTASSIUM: 3.9 mmol/L (ref 3.5–5.0)
PROTEIN TOTAL: 5.2 g/dL — ABNORMAL LOW (ref 6.5–8.3)
SODIUM: 133 mmol/L — ABNORMAL LOW (ref 135–145)

## 2019-10-12 LAB — CBC
HEMOGLOBIN: 6.1 g/dL — ABNORMAL LOW (ref 12.0–16.0)
MEAN CORPUSCULAR HEMOGLOBIN CONC: 32.2 g/dL (ref 31.0–37.0)
MEAN CORPUSCULAR HEMOGLOBIN: 27.4 pg (ref 26.0–34.0)
MEAN CORPUSCULAR VOLUME: 85.1 fL (ref 80.0–100.0)
MEAN PLATELET VOLUME: 12.8 fL — ABNORMAL HIGH (ref 7.0–10.0)
PLATELET COUNT: 33 10*9/L — ABNORMAL LOW (ref 150–440)
RED BLOOD CELL COUNT: 2.23 10*12/L — ABNORMAL LOW (ref 4.00–5.20)
WBC ADJUSTED: 1.6 10*9/L — ABNORMAL LOW (ref 4.5–11.0)

## 2019-10-12 LAB — PROTIME-INR: PROTIME: 21.1 s — ABNORMAL HIGH (ref 10.5–13.5)

## 2019-10-12 LAB — INR: Coagulation tissue factor induced.INR:RelTime:Pt:PPP:Qn:Coag: 1.82

## 2019-10-12 LAB — SODIUM URINE: Lab: 10

## 2019-10-12 LAB — HEMOGLOBIN: Hemoglobin:MCnc:Pt:Bld:Qn:: 7.5 — ABNORMAL LOW

## 2019-10-12 LAB — BILIRUBIN TOTAL: Bilirubin:MCnc:Pt:Ser/Plas:Qn:: 2.4 — ABNORMAL HIGH

## 2019-10-12 LAB — BASOPHILS ABSOLUTE COUNT: Basophils:NCnc:Pt:Bld:Qn:Automated count: 0

## 2019-10-12 LAB — PHOSPHORUS: Phosphate:MCnc:Pt:Ser/Plas:Qn:: 1.6 — ABNORMAL LOW

## 2019-10-12 LAB — OSMOLALITY URINE: Lab: 420

## 2019-10-12 LAB — MAGNESIUM: Magnesium:MCnc:Pt:Ser/Plas:Qn:: 2.1

## 2019-10-12 LAB — RED CELL DISTRIBUTION WIDTH: Lab: 26.2 — ABNORMAL HIGH

## 2019-10-12 NOTE — Unmapped (Signed)
HEPATOLOGY INPATIENT FOLLOW UP NOTE     Requesting Attending Physician:  Donnal Moat, *    Reason for Consult:  [phone] w/hx of NAFLD cirrhosis (d/b ascites, HE, EV bleeding), s/p TIPS 06/2018 and embolization of mesocaval varices, w/subsequent worsening HE, also c/b GAVE requiring multiple episodes APC, who was admitted w/AMS and melena.     EGD with 1/19 w/PHG, duodenopathy and severe GAVE, treated with APC. TIPS ultrasound showed elevated velocities c/w mild stenosis but without significant step off.     Seen in consultation at the request of Dr. Donnal Moat, * for encephalopathy    Interval History:  -Tmax of 38.2 overnight. More SOB, Hgb 6.1 this AM. No evidence of GI bleeding. Patient on 2L [place] for comfort.   -Receiving 2 units of PRBC this morning  -Does not feel like fluid is worse. Still reports some nasal congestion.    ASSESSMENT / PLAN:     #Encephalopathy: Likely hepatic encephalopathy, which could have several etiologies including infection, hypercalcemia, dehydration in the setting of a bowel movements 2 days ago, potentially in the setting of decreasing her lactulose yesterday, and maybe overall has brittle mental status in the setting of her TIPS, although TIPS if anything has slight stenosis.     Plan:  -Agree with infection workup again. Would repeat CXR to ensure not having worsening opacities in bases  -Send for viral studies, including CMV and EBV VL given pancytopenia and fever  -Defer hypercalcemia workup to primary team, but needs to be addressed as it is not improving with hydration  -Continue lactulose 3 times daily, goal 4-5 bowel movements daily    #AKI: Differential includes infection related-AKI, or dehydration in the setting of poor p.o. intake, less likely HRS in the setting of TIPS and minimal ascites..    Plan  -Please hold diuretics, including metolazone and spironolactone  -Give albumin 50g today  - Strict Is/Os  - Continue low Na diet    #Anemia: May be dilutional. No evidence of melena or hematochezia. Previous hemolysis workup is negative.     Plan:  -Agree with transfusion, goal Hgb > 7  -Monitor s/s melena as that would require repeat EGD for evaluation    Please page [phone] for further questions/concerns.        Georga Hacking Sherryll Burger, MD  Assistant Professor of Medicine   [facility]  Division of Gastroenterology & Hepatology  p: [phone] - f: [phone]         Medications:  Current Facility-Administered Medications   Medication Dose Route Frequency Provider Last Rate Last Admin   ??? albumin human 25 % bottle 75 g  75 g Intravenous Once Sallyanne Kuster, MD       ??? atorvastatin (LIPITOR) tablet 10 mg  10 mg Oral Nightly Damien Fusi, MD   10 mg at 10/11/19 2016   ??? busPIRone (BUSPAR) tablet 10 mg  10 mg Oral BID Damien Fusi, MD   10 mg at 10/12/19 5784   ??? dextrose 50 % in water (D50W) 50 % solution 12.5 g  12.5 g Intravenous Q10 Min PRN Damien Fusi, MD       ??? insulin glargine (LANTUS) injection 60 Units  60 Units Subcutaneous Daily Hinda Lenis, MD   60 Units at 10/12/19 0813   ??? insulin lispro (HumaLOG) injection 0-12 Units  0-12 Units Subcutaneous ACHS Damien Fusi, MD   2 Units at 10/12/19 1000   ???  lactated Ringers infusion  10 mL/hr Intravenous Continuous Pia Mau, MD   Stopped at 10/09/19 [phone]   ??? lactulose (CHRONULAC) oral solution (30 mL cup)  30 g Oral TID Hinda Lenis, MD   30 g at 10/12/19 0813   ??? levothyroxine (SYNTHROID) tablet 25 mcg  25 mcg Oral Daily Damien Fusi, MD   25 mcg at 10/12/19 9604   ??? pantoprazole (PROTONIX) EC tablet 40 mg  40 mg Oral BID Hinda Lenis, MD   40 mg at 10/12/19 0814   ??? potassium chloride (KLOR-CON) CR tablet 40 mEq  40 mEq Oral BID Hinda Lenis, MD   40 mEq at 10/12/19 0814   ??? rifAXIMin (XIFAXAN) tablet 550 mg  550 mg Oral BID Damien Fusi, MD   550 mg at 10/12/19 5409   ??? sertraline (ZOLOFT) tablet 50 mg  50 mg Oral Daily Damien Fusi, MD   50 mg at 10/12/19 8119   ??? simethicone (MYLICON) chewable tablet 80 mg  80 mg Oral Q6H PRN Hinda Lenis, MD       ??? sodium phosphate 15 mmol in dextrose 5 % 250 mL IVPB  15 mmol Intravenous Once Sallyanne Kuster, MD           Vital Signs:  Temp:  [36.5 ??C (97.7 ??F)-38.2 ??C (100.8 ??F)] 37.2 ??C (99 ??F)  Heart Rate:  [72-86] 86  SpO2 Pulse:  [78-81] 80  Resp:  [16-20] 20  BP: (107-127)/(29-42) 118/41  MAP (mmHg):  [55-60] 55  SpO2:  [89 %-98 %] 93 %    Intake/Output last 3 shifts:  I/O last 3 completed shifts:  In: 440 [P.O.:440]  Out: 250 [Urine:250]    Physical Exam:  General appearance: Appears chronically ill  HEENT: Icteric sclera sclera  Cardiovascular: Regular rate  Pulmonary: Normal work of breathing. Acyanotic  Abdominal: soft, mild distention, no fluid wave, no masses or organomegaly.  Musculoskeletal: No temporal wasting. bilateral pitting edema of LEs  Skin: + Jaundice  Neurologic: Alert, oriented to place/year, did not know date. Slow to answer questions.  Psychiatric: Appropriate.  ??    Diagnostic Studies:   Labs:  Recent Labs     10/10/19  0623 10/11/19  1125 10/11/19  2302 10/12/19  0520   WBC 4.8 2.0*  --  1.6*   HGB 9.7* 7.1* 6.2* 6.1*   HCT 30.1* 22.1* 19.8* 19.0*   PLT 52* 43*  --  33*     Recent Labs     10/10/19  0623 10/11/19  1125 10/12/19  0520   NA 128* 129* 133*   K 3.0* 3.0* 3.9   CL 90* 90* 98   BUN 34* 39* 50*   CREATININE 1.36* 1.64* 2.02*   GLU 169 393* 201*     Recent Labs     10/10/19  0623 10/11/19  1125 10/12/19  0520   PROT 6.1* 5.4* 5.2*   ALBUMIN 3.0* 3.0* 3.2*   AST 72* 68* 65*   ALT 39* 31 27   ALKPHOS 116 69 71   BILITOT 5.9* 3.5* 2.4*     Recent Labs     10/10/19  0623 10/11/19  1125 10/12/19  0520   INR 1.38 1.71 1.82

## 2019-10-12 NOTE — Unmapped (Signed)
No acute events overnight. VSS, voiding. Denies pain at this time. All safety measures in place. Will continue to monitor.  Problem: Adult Inpatient Plan of Care  Goal: Plan of Care Review  Outcome: Progressing  Goal: Patient-Specific Goal (Individualization)  Outcome: Progressing  Goal: Absence of Hospital-Acquired Illness or Injury  Outcome: Progressing  Goal: Optimal Comfort and Wellbeing  Outcome: Progressing  Goal: Readiness for Transition of Care  Outcome: Progressing  Goal: Rounds/Family Conference  Outcome: Progressing     Problem: Fall Injury Risk  Goal: Absence of Fall and Fall-Related Injury  Outcome: Progressing     Problem: Self-Care Deficit  Goal: Improved Ability to Complete Activities of Daily Living  Outcome: Progressing     Problem: Skin Injury Risk Increased  Goal: Skin Health and Integrity  Outcome: Progressing     Problem: Diabetes Comorbidity  Goal: Blood Glucose Level Within Desired Range  Outcome: Progressing

## 2019-10-12 NOTE — Unmapped (Signed)
Pt has fluid bolus for decreased DP's at this time otherwise port is saline locked and VSS.Tolerating po diet and fluids, voiding and stooling. Ambulates with walker and 1 assist without difficulty. Given meds as ordered.

## 2019-10-12 NOTE — Unmapped (Signed)
Internal Medicine (MDW) Progress Note    Assessment & Plan:   Marilyn French is a 64 y.o. female with history of NASH cirrhosis s/p TIPS c/b worsening HE, prior variceal bleed, GAVE, HTN, pHTN, anxiety, chronic microcytic anemia, DM, and recent OSH admission for melena who presents with ongoing melena and worsening encephalopathy c/f GI bleed.    Principal Problem:    Upper GI bleed  Active Problems:    NAFLD (nonalcoholic fatty liver disease)    Cirrhosis (CMS-HCC)    GAVE (gastric antral vascular ectasia)    Hypothyroid    Hepatic encephalopathy (CMS-HCC)    Iron deficiency anemia  Resolved Problems:    * No resolved hospital problems. *      Hepatic Encephalopathy: While there was a period of improvement in mental status on 1/20 immediately post-operatively, her mental status has been worsening since 1/21. She has continued lethargy and is very slow to answer questions. Initial oncern for possible infection but infectious work-up negative to date (blood and urine, no ascites present). Major depression might be a significant contributor to her slowed speech and poor functional status. Additionally, her liver disease may frankly be worsening as suggested by her current MELD.  - Continue lactulose to 300 mg QID - titrate to 3-5 BMs daily as she's had 8 stools in the last 24 hours.   - Continue Rifaxan     AKI: Cr risen to 1.64 from baseline ~1 on 1/23. Unlikely HRS without ascites, but will evaluate urine studies. Likely hypovolemic with poor PO intake.  - repeat 1g/kg albumin today  - LR bolus  - urine sodium, osm  - serum osm   - holding diuresis    Anxiety/Depression: Pt endorses major sadness and depressive symptoms in regards to her poor condition and low candidacy for transplant.  - continue Home Buspar, zoloft  - will consult palliative care on Monday, 1/25 to help with psychosocial symptoms    Melena - Microcytic Anemia: Ongoing problem for her in the past but reports worsening melena that prompted presentation to OSH. Initial hemoglobin there was 5.8 on 1/11 and she ultimately req'd 4U RBCs over the course of a 5 day admission. Still reports ongoing melena. Hemoglobin now stable at 8.5, up from 7.7 as early as morning of [place] presentation (albeit on different equipment). No evidence for brisk bleed and low concern for variceal issue given TIPS and clinical stability. Most likely related to GAVE or other upper GI etiology. On 1/23, acute drop in Hgb to 9.1->7.1. No evidence of acute overt bleeding, may be dilutional. Significant iron deficiency noted.  - See hepatology recs as above  - CBC daily  - repeat H/H at 1900 tonight  - consider iron infusion  - PO ppi BID     NASH Cirrhosis s/p TIPS: Meld actually lower today at 17 and most recent clinic visit with hepatology in October last year. Has been turned down twice for LFT. Follows with Dr. Raford Pitcher. Overall appears well compensated other than encephalopathy and with volume status actually much improved per patient and discharge summary from OSH; down 12 lbs since presentation on 1/11 with IV diuresis and was transitioned to home bumex 2 mg BID in addition to new-start metolazone 5 mg daily. Reach out to hepatology in the morning. Patient having multiple BMs since yesterday. No obvious hematochezia or melena noted.   - HOLD diuretics: bumex 2 mg BID, aldactone 100 mg daily, metolazone 5 mg daily  ??  MELD-Na score: 27  at 10/11/2019 11:25 AM  MELD score: 22 at 10/11/2019 11:25 AM  Calculated from:  Serum Creatinine: 1.64 mg/dL at 1/61/0960 45:40 AM  Serum Sodium: 129 mmol/L at 10/11/2019 11:25 AM  Total Bilirubin: 3.5 mg/dL at 9/81/1914 78:29 AM  INR(ratio): 1.71 at 10/11/2019 11:25 AM  Age: 9 years 4 months    DM: Was receiving lantus 55U in AM, 10U in PM at OSH; give 45U at bedtime (prior home dose) in setting of poor PO intake. Sugars with continued elevation overnight and this morning.   - Increase lantus 60U  - SSI  ??  Hypothyroidism: home synthroid  HLD: crestor --> lipitor per formulary  ??  Code status confirmed full code on arrival and with husband as healthcare proxy.   ??  Daily Checklist:  Diet: Regular Diet  DVT PPx: SCDs   GI PPx: PPI  Electrolytes: K repletion provided.  Dispo: Admit to MDW and Continue Routine Care    Hinda Lenis, MD  Medicine-Pediatrics, PGY-3  Pager [phone]    Subjective:   Patient appears increasingly altered compared to yesterday. She is extremely slow to respond to questions and appears generally unwell. She complains of abdominal pain. No new urinary complaints or shortness of breath.      Objective:   Temp:  [36.8 ??C-37.1 ??C] 37.1 ??C  Heart Rate:  [72-83] 72  Resp:  [16-18] 17  BP: (101-127)/(30-52) 108/41  SpO2:  [92 %-94 %] 93 %    Gen: Chronically ill-appearing, appears older than stated age. Slow to respond to questions, but faster speech when talking about her depressive symptoms.  HEENT: atraumatic, sclera anicteric, MMM. OP w/o erythema or exudate   Heart: RRR, S1, S2, no M/R/G, no chest wall tenderness  Lungs: CTAB, no crackles or wheezes, no use of accessory muscles  Abdomen: Normoactive bowel sounds, soft, NTND, no rebound/guarding. No appreciable fluid wave on exam.   Extremities: no clubbing, cyanosis. 2+ pitting edema to bilateral lower extremities. Subtle asterixis on exam  Neuro: a&o x3. She appears somnolent     Labs/Studies: Labs and Studies from the last 24hrs per EMR and Reviewed

## 2019-10-12 NOTE — Unmapped (Signed)
Internal Medicine (MDW) Progress Note    Assessment & Plan:   Marilyn French is a 64 y.o. female with history of NASH cirrhosis s/p TIPS c/b worsening HE, prior variceal bleed, GAVE, HTN, pHTN, anxiety, chronic microcytic anemia, DM, and recent OSH admission for melena who presents with ongoing melena and worsening encephalopathy c/f GI bleed.    Principal Problem:    Upper GI bleed  Active Problems:    NAFLD (nonalcoholic fatty liver disease)    Cirrhosis (CMS-HCC)    GAVE (gastric antral vascular ectasia)    Hypothyroid    Hepatic encephalopathy (CMS-HCC)    Iron deficiency anemia  Resolved Problems:    * No resolved hospital problems. *      Hepatic Encephalopathy: While there was a period of improvement in mental status on 1/20 immediately post-operatively, her mental status has been worsening since 1/21. She has continued lethargy and is very slow to answer questions. Initial oncern for possible infection but infectious work-up negative to date (blood and urine, no ascites present). Major depression might be a significant contributor to her slowed speech and poor functional status. Additionally, her liver disease may frankly be worsening as suggested by her current MELD.   - D/w hepatology- will see patient today. Appreciate recs   - Continue lactulose to 300 mg QID - titrate to 3-5 BMs daily as she's had 8 stools in the last 24 hours.   - Continue Rifaxan     AKI: Cr continues to rise to 2.02 this morning from baseline ~1 on 1/23. Unlikely HRS without ascites, but will evaluate urine studies. Likely hypovolemic with poor PO intake.  - repeat 1g/kg albumin today  - holding diuresis    Anxiety/Depression: Pt endorses major sadness and depressive symptoms in regards to her poor condition and low candidacy for transplant.  - continue Home Buspar, zoloft  - will consult palliative care on Monday, 1/25 to help with psychosocial symptoms    Melena - Microcytic Anemia: Ongoing problem for her in the past but reports worsening melena that prompted presentation to OSH. Initial hemoglobin there was 5.8 on 1/11 and she ultimately req'd 4U RBCs over the course of a 5 day admission. Still reports ongoing melena. Hemoglobin now stable at 8.5, up from 7.7 as early as morning of [place] presentation (albeit on different equipment). No evidence for brisk bleed and low concern for variceal issue given TIPS and clinical stability. Most likely related to GAVE or other upper GI etiology. On 1/23, acute drop in Hgb to 9.1->7.1. Repeat H&H with hgb on 6.2 No evidence of acute overt bleeding, however given recent endoscopic intervention, will FU with hepatology for evaluation of possible source of bleeding. - See hepatology recs as above  - Transfuse 1u pRBC with goal of hgb >7.0  - CBC daily  - repeat H/H at 1900 tonight  - consider iron infusion  - PO ppi BID     NASH Cirrhosis s/p TIPS: Meld actually lower today at 17 and most recent clinic visit with hepatology in October last year. Has been turned down twice for LFT. Follows with Dr. Raford Pitcher. Overall appears well compensated other than encephalopathy and with volume status actually much improved per patient and discharge summary from OSH; down 12 lbs since presentation on 1/11 with IV diuresis and was transitioned to home bumex 2 mg BID in addition to new-start metolazone 5 mg daily. Reach out to hepatology in the morning. Patient having multiple BMs since yesterday. No obvious hematochezia or  melena noted.   - HOLD diuretics: bumex 2 mg BID, aldactone 100 mg daily, metolazone 5 mg daily  ??  MELD-Na score: 25 at 10/12/2019  5:20 AM  MELD score: 23 at 10/12/2019  5:20 AM  Calculated from:  Serum Creatinine: 2.02 mg/dL at 1/61/0960  4:54 AM  Serum Sodium: 133 mmol/L at 10/12/2019  5:20 AM  Total Bilirubin: 2.4 mg/dL at 0/98/1191  4:78 AM  INR(ratio): 1.82 at 10/12/2019  5:20 AM  Age: 74 years 4 months    DM: Was receiving lantus 55U in AM, 10U in PM at OSH; give 45U at bedtime (prior home dose) in setting of poor PO intake. Sugars with continued elevation overnight and this morning.   - Increase lantus 60U  - SSI  ??  Hypothyroidism: home synthroid  HLD: crestor --> lipitor per formulary  ??  Code status confirmed full code on arrival and with husband as healthcare proxy.   ??  Daily Checklist:  Diet: Regular Diet  DVT PPx: SCDs   GI PPx: PPI  Electrolytes: K repletion provided.  Dispo: Admit to MDW and Continue Routine Care        Subjective:   Patient is A&O x3, but continues to be slow to respond. Stable from yesterday. She reports mild abdominal pain, but otherwise has no new complaints.     Objective:   Temp:  [36.5 ??C-38.2 ??C] 37.9 ??C  Heart Rate:  [72-86] 86  Resp:  [16-18] 18  BP: (107-127)/(32-42) 112/37  SpO2:  [92 %-98 %] 94 %    Gen: Chronically ill-appearing, appears older than stated age. Slow to respond to questions, but faster speech when talking about her depressive symptoms.  HEENT: atraumatic, sclera anicteric, MMM. OP w/o erythema or exudate   Heart: RRR, S1, S2, no M/R/G, no chest wall tenderness  Lungs: CTAB, no crackles or wheezes, no use of accessory muscles  Abdomen: Normoactive bowel sounds, soft, NTND, no rebound/guarding. No appreciable fluid wave on exam.   Extremities: no clubbing, cyanosis. 2+ pitting edema to bilateral lower extremities. Subtle asterixis on exam  Neuro: a&o x3. She appears somnolent     Labs/Studies: Labs and Studies from the last 24hrs per EMR and Reviewed

## 2019-10-13 ENCOUNTER — Encounter (HOSPITAL_COMMUNITY)
Admission: RE | Admit: 2019-10-13 | Discharge: 2019-10-13 | Disposition: A | Discharge status: Home / Self Care | Payer: PPO | Source: Ambulatory Visit | Attending: Internal Medicine | Admitting: Internal Medicine

## 2019-10-13 LAB — COMPREHENSIVE METABOLIC PANEL
ALBUMIN: 3.6 g/dL (ref 3.5–5.0)
ALKALINE PHOSPHATASE: 60 U/L (ref 38–126)
ALT (SGPT): 25 U/L (ref <35)
ANION GAP: 5 mmol/L — ABNORMAL LOW (ref 7–15)
AST (SGOT): 54 U/L — ABNORMAL HIGH (ref 14–38)
BLOOD UREA NITROGEN: 55 mg/dL — ABNORMAL HIGH (ref 7–21)
BUN / CREAT RATIO: 37
CALCIUM: 10.1 mg/dL (ref 8.5–10.2)
CHLORIDE: 104 mmol/L (ref 98–107)
CO2: 23 mmol/L (ref 22.0–30.0)
CREATININE: 1.48 mg/dL — ABNORMAL HIGH (ref 0.60–1.00)
EGFR CKD-EPI AA FEMALE: 43 mL/min/{1.73_m2} — ABNORMAL LOW (ref >=60)
GLUCOSE RANDOM: 129 mg/dL (ref 70–179)
POTASSIUM: 3.9 mmol/L (ref 3.5–5.0)
PROTEIN TOTAL: 5.8 g/dL — ABNORMAL LOW (ref 6.5–8.3)
SODIUM: 132 mmol/L — ABNORMAL LOW (ref 135–145)

## 2019-10-13 LAB — CBC W/ AUTO DIFF
BASOPHILS ABSOLUTE COUNT: 0 10*9/L (ref 0.0–0.1)
BASOPHILS RELATIVE PERCENT: 0.3 %
EOSINOPHILS RELATIVE PERCENT: 4.1 %
HEMATOCRIT: 23.7 % — ABNORMAL LOW (ref 36.0–46.0)
HEMOGLOBIN: 7.5 g/dL — ABNORMAL LOW (ref 12.0–16.0)
LARGE UNSTAINED CELLS: 5 % — ABNORMAL HIGH (ref 0–4)
LYMPHOCYTES ABSOLUTE COUNT: 0.4 10*9/L — ABNORMAL LOW (ref 1.5–5.0)
LYMPHOCYTES RELATIVE PERCENT: 28.5 %
MEAN CORPUSCULAR HEMOGLOBIN CONC: 31.7 g/dL (ref 31.0–37.0)
MEAN CORPUSCULAR HEMOGLOBIN: 27.6 pg (ref 26.0–34.0)
MEAN PLATELET VOLUME: 13.1 fL — ABNORMAL HIGH (ref 7.0–10.0)
MONOCYTES ABSOLUTE COUNT: 0.1 10*9/L — ABNORMAL LOW (ref 0.2–0.8)
MONOCYTES RELATIVE PERCENT: 9.2 %
NEUTROPHILS ABSOLUTE COUNT: 0.8 10*9/L — ABNORMAL LOW (ref 2.0–7.5)
NEUTROPHILS RELATIVE PERCENT: 52.8 %
PLATELET COUNT: 30 10*9/L — ABNORMAL LOW (ref 150–440)
RED BLOOD CELL COUNT: 2.73 10*12/L — ABNORMAL LOW (ref 4.00–5.20)
RED CELL DISTRIBUTION WIDTH: 24.4 % — ABNORMAL HIGH (ref 12.0–15.0)

## 2019-10-13 LAB — CALCIUM IONIZED VENOUS (MG/DL): Calcium.ionized:MCnc:Pt:Bld:Qn:: 4.9

## 2019-10-13 LAB — FOLATE: Folate:MCnc:Pt:Ser/Plas:Qn:: 11.1

## 2019-10-13 LAB — VITAMIN B-12: Cobalamins:MCnc:Pt:Ser/Plas:Qn:: 535

## 2019-10-13 LAB — HEMATOCRIT: Hematocrit:VFr:Pt:Bld:Qn:: 22.1 — ABNORMAL LOW

## 2019-10-13 LAB — MAGNESIUM: Magnesium:MCnc:Pt:Ser/Plas:Qn:: 2

## 2019-10-13 LAB — CMV DNA, QUANTITATIVE, PCR: CMV VIRAL LD: NOT DETECTED

## 2019-10-13 LAB — VITAMIN D, TOTAL (25OH): Lab: 21.9

## 2019-10-13 LAB — CMV QUANT LOG10: Lab: 0

## 2019-10-13 LAB — PHOSPHORUS: Phosphate:MCnc:Pt:Ser/Plas:Qn:: 3.2

## 2019-10-13 LAB — LYMPHOCYTES RELATIVE PERCENT: Lymphocytes/100 leukocytes:NFr:Pt:Bld:Qn:Automated count: 28.5

## 2019-10-13 LAB — PROTIME: Coagulation tissue factor induced:Time:Pt:PPP:Qn:Coag: 21.3 — ABNORMAL HIGH

## 2019-10-13 LAB — CREATININE: Creatinine:MCnc:Pt:Ser/Plas:Qn:: 1.48 — ABNORMAL HIGH

## 2019-10-13 NOTE — Unmapped (Signed)
Pt stable today. Received 2 units PRBCs, 75 albumin, and sodium phos iv. Continues to be drowsy today. SBP around 100, DBP routinely around 30-40. Medical team aware, this RN spoke with team and stated PRBCs were to help bump pressure up. No changes in call parameters or instrutions given on when to call for a concerning dbp. Confirmed with manual Bp cuff that DBP is in 30s, see charting. Asymptomatic with these low pressures. Tolerates OOB activity to BSC, but did not ambulate or get up to chair. IS encouraged and deep breathing. Ordered trays for patient due to fatigue and drowsiness. Follow up H&H after blood was improved. Voiding and has stooled 3 times today.    Problem: Adult Inpatient Plan of Care  Goal: Plan of Care Review  Outcome: Progressing  Goal: Patient-Specific Goal (Individualization)  Outcome: Progressing  Goal: Absence of Hospital-Acquired Illness or Injury  Outcome: Progressing  Goal: Optimal Comfort and Wellbeing  Outcome: Progressing  Goal: Readiness for Transition of Care  Outcome: Progressing  Goal: Rounds/Family Conference  Outcome: Progressing

## 2019-10-13 NOTE — Unmapped (Signed)
Patient has been resting with eyes closed for most of the night.  No complaints or concerns.  Patient has had 4 BMs prior to 3rd dose of lactulose which we held.

## 2019-10-13 NOTE — Unmapped (Signed)
Internal Medicine (MDW) Progress Note    Assessment & Plan:   Marilyn French is a 64 y.o. female with history of NASH cirrhosis s/p TIPS c/b worsening HE, prior variceal bleed, GAVE, HTN, pHTN, anxiety, chronic microcytic anemia, DM, and recent OSH admission for melena who presents with ongoing melena and worsening encephalopathy c/f GI bleed.    Principal Problem:    Upper GI bleed  Active Problems:    NAFLD (nonalcoholic fatty liver disease)    Cirrhosis (CMS-HCC)    GAVE (gastric antral vascular ectasia)    Hypothyroid    Hepatic encephalopathy (CMS-HCC)    Iron deficiency anemia  Resolved Problems:    * No resolved hospital problems. *      Hepatic Encephalopathy: While there was a period of improvement in mental status on 1/20 immediately post-operatively, her mental status has been worsening since 1/21. She has continued lethargy and is very slow to answer questions. Initial oncern for possible infection but infectious work-up negative to date (blood and urine, no ascites present). Major depression might be a significant contributor to her slowed speech and poor functional status. Additionally, her liver disease may frankly be worsening as suggested by her current MELD.   - Hepatology following, appreciate recs   - Continue lactulose to 40 mg QID - titrate to 3-5 BMs daily as she's had 8 stools in the last 24 hours.   - Continue Rifaxan     AKI - electrolyte derangements: Cr improved to 1.4 this morning. Baseline ~1 on 1/23. Will continue to work-up hypercalcemia. Thyroid studies wnl. PTN wnl, but may be inappropriately normal.   - FU ionized calcium, vit D   - holding diuresis    Anxiety/Depression: Pt endorses major sadness and depressive symptoms in regards to her poor condition and low candidacy for transplant.  - continue Home Buspar, zoloft  - will consult palliative care on Monday, 1/25 to help with psychosocial symptoms    Melena - Microcytic Anemia: Ongoing problem for her in the past but reports worsening melena that prompted presentation to OSH. Initial hemoglobin there was 5.8 on 1/11 and she ultimately req'd 4U RBCs over the course of a 5 day admission. Still reports ongoing melena. Hemoglobin now stable at 8.5, up from 7.7 as early as morning of [place] presentation (albeit on different equipment). No evidence for brisk bleed and low concern for variceal issue given TIPS and clinical stability. Most likely related to GAVE or other upper GI etiology. Hemoglobin stable at 7.5 s/p two units of pRBCs on 1/24.   - See hepatology recs as above  - Transfuse 1u pRBC with goal of hgb >7.0  - CBC daily  - PO ppi BID     NASH Cirrhosis s/p TIPS: Meld actually lower today at 17 and most recent clinic visit with hepatology in October last year. Has been turned down twice for LFT. Follows with Dr. Raford Pitcher. Overall appears well compensated other than encephalopathy and with volume status actually much improved per patient and discharge summary from OSH; down 12 lbs since presentation on 1/11 with IV diuresis and was transitioned to home bumex 2 mg BID in addition to new-start metolazone 5 mg daily. Reach out to hepatology in the morning. Patient having multiple BMs since yesterday. No obvious hematochezia or melena noted.   - HOLD diuretics: bumex 2 mg BID, aldactone 100 mg daily, metolazone 5 mg daily  ??  MELD-Na score: 24 at 10/13/2019  8:45 AM  MELD score: 21 at 10/13/2019  8:45 AM  Calculated from:  Serum Creatinine: 1.48 mg/dL at 1/61/0960  4:54 AM  Serum Sodium: 132 mmol/L at 10/13/2019  8:45 AM  Total Bilirubin: 2.8 mg/dL at 0/98/1191  4:78 AM  INR(ratio): 1.84 at 10/13/2019  8:45 AM  Age: 63 years 4 months    DM: Was receiving lantus 55U in AM, 10U in PM at OSH; give 45U at bedtime (prior home dose) in setting of poor PO intake. Sugars with continued elevation overnight and this morning.   - Increase lantus 60U  - SSI  ??  Hypothyroidism: home synthroid  HLD: crestor --> lipitor per formulary  ??  Code status confirmed full code on arrival and with husband as healthcare proxy.   ??  Daily Checklist:  Diet: Regular Diet  DVT PPx: SCDs   GI PPx: PPI  Dispo: Admit to MDW and Continue Routine Care        Subjective:   Patient is more alert this morning and communicative. She states that she feels the same as yesterday. We spent some time this morning discussing the palliative care team coming by to discuss possible goals of care. The patient reports that she wants to live and that she wants a liver, she states that she is not ready to give up.      Objective:   Temp:  [36.8 ??C-36.9 ??C] 36.8 ??C  Heart Rate:  [72-76] 76  Resp:  [18-20] 20  BP: (98-118)/(29-38) 118/36  SpO2:  [97 %-100 %] 97 %    Gen: Chronically ill-appearing, appears older than stated age. Slow to respond to questions, but faster speech when talking about her depressive symptoms.  HEENT: atraumatic, sclera anicteric, MMM. OP w/o erythema or exudate   Heart: RRR, S1, S2, no M/R/G, no chest wall tenderness  Lungs: CTAB, no crackles or wheezes, no use of accessory muscles  Abdomen: Normoactive bowel sounds, soft, NTND, no rebound/guarding. No appreciable fluid wave on exam.   Extremities: no clubbing, cyanosis. 2+ pitting edema to bilateral lower extremities. Subtle asterixis on exam  Neuro: a&o x3. She appears somnolent     Labs/Studies: Labs and Studies from the last 24hrs per EMR and Reviewed

## 2019-10-13 NOTE — Unmapped (Signed)
HEPATOLOGY INPATIENT FOLLOW UP NOTE     Requesting Attending Physician:  Reece Packer, *    Reason for Consult:  [phone] w/hx of NAFLD cirrhosis (d/b ascites, HE, EV bleeding), s/p TIPS 06/2018 and embolization of mesocaval varices, w/subsequent worsening HE, also c/b GAVE requiring multiple episodes APC, who was admitted w/AMS and melena.     EGD with 1/19 w/PHG, duodenopathy and severe GAVE, treated with APC. TIPS ultrasound showed elevated velocities c/w mild stenosis but without significant step off. Course now c/b hyponatremia and AKI thought to be 2/2 dehydration/poor PO intake.     Seen in consultation at the request of Dr. Ceasar Mons Elder Love, * for encephalopathy    Interval History:  -She had a fever yesterday  morning to 38.2, chest x-ray had shown opacities in the left lung that were concerning for atelectasis, however given how asymmetric cannot rule out an infection.  -Urine sodium 10  -Yesterday her hemoglobin was 6.1, however this did appear to be partially dilutional.  She got 2 units of packed red blood cells.  -She had 4 bowel movements and 350 cc of urine output yesterday  - Labs today are notable for WBC 1.6, Hgb 7.5, Platelets 30, Cr coming down to 1.48  - Patient reports feeling a little better today than yesterday. Still having trouble keeping track of days.       ASSESSMENT / PLAN:     #Encephalopathy - Improving: Suspect HE in s/o dehydration/over-diuresis > infection (though CXR could be c/w PNA), though has gotten better with albumin/rehydration/holding diuretics.   Plan:  - Hypercalcemia has improved with hydration  - Infectious workup thus far negative aside from CXR w/?atlectasis vs infection  -Continue lactulose 3 times daily, goal 4-5 bowel movements daily    #AKI: Suspect pre-renal given response to albumin, holding diuretics. Differential also includes infection related-AKI, less likely HRS in the setting of TIPS and minimal ascites.   Plan  -Please continue to hold diuretics, including metolazone and spironolactone  -Albumin 1 g per/kg again today  - Strict Is/Os  - Continue low Na diet    MELD-Na score: 24 at 10/13/2019  8:45 AM  MELD score: 21 at 10/13/2019  8:45 AM  Calculated from:  Serum Creatinine: 1.48 mg/dL at 9/48/5462  7:03 AM  Serum Sodium: 132 mmol/L at 10/13/2019  8:45 AM  Total Bilirubin: 2.8 mg/dL at 5/00/9381  8:29 AM  INR(ratio): 1.84 at 10/13/2019  8:45 AM  Age: 64 years 4 months    I spoke with patient's spouse, Markala Sitts, on the phone this evening, and he had several questions regarding her MELD score and reports her quality of life is poor and he feels like she really needs a transplant. He reports that she is mobile and walks around the house, and feels like she is not very frail. He would like her to be reassessed and wants [place] to discuss transplant again. I told him we would continue to discuss her transplant eligibility and frailty. The plan would be to recover from the current hospitalization and work with home physical therapy to get stronger and reassess in the outpatient setting.      Please page [phone] for further questions/concerns. Patient seen and discussed with Dr. Arlyss Repress, MD  Gastroenterology Fellow, PGY-4  [facility]           Medications:  Current Facility-Administered Medications   Medication Dose Route Frequency Provider Last Rate Last Admin   ???  atorvastatin (LIPITOR) tablet 10 mg  10 mg Oral Nightly Damien Fusi, MD   10 mg at 10/12/19 2122   ??? busPIRone (BUSPAR) tablet 10 mg  10 mg Oral BID Damien Fusi, MD   10 mg at 10/12/19 2042   ??? dextrose 50 % in water (D50W) 50 % solution 12.5 g  12.5 g Intravenous Q10 Min PRN Damien Fusi, MD       ??? insulin glargine (LANTUS) injection 60 Units  60 Units Subcutaneous Daily Hinda Lenis, MD   60 Units at 10/12/19 0813   ??? insulin lispro (HumaLOG) injection 0-12 Units  0-12 Units Subcutaneous ACHS Damien Fusi, MD   2 Units at 10/12/19 2120   ??? lactated Ringers infusion  10 mL/hr Intravenous Continuous Pia Mau, MD   Stopped at 10/09/19 [phone]   ??? lactulose (CHRONULAC) oral solution (30 mL cup)  40 g Oral TID Suzan Slick, MD   Stopped at 10/12/19 2145   ??? levothyroxine (SYNTHROID) tablet 25 mcg  25 mcg Oral Daily Damien Fusi, MD   25 mcg at 10/12/19 8295   ??? pantoprazole (PROTONIX) EC tablet 40 mg  40 mg Oral BID Hinda Lenis, MD   40 mg at 10/12/19 2122   ??? rifAXIMin (XIFAXAN) tablet 550 mg  550 mg Oral BID Damien Fusi, MD   550 mg at 10/12/19 2122   ??? sertraline (ZOLOFT) tablet 50 mg  50 mg Oral Daily Damien Fusi, MD   50 mg at 10/12/19 6213   ??? simethicone (MYLICON) chewable tablet 80 mg  80 mg Oral Q6H PRN Hinda Lenis, MD           Vital Signs:  Temp:  [36.7 ??C-37.9 ??C] 36.8 ??C  Heart Rate:  [72-86] 72  SpO2 Pulse:  [74-81] 74  Resp:  [18-20] 18  BP: (98-118)/(29-41) 101/38  MAP (mmHg):  [48-57] 57  SpO2:  [89 %-100 %] 98 %    Intake/Output last 3 shifts:  I/O last 3 completed shifts:  In: 2254.3 [P.O.:1400; Blood:554.3; IV Piggyback:300]  Out: 600 [Urine:600]    Physical Exam:  General appearance: Appears chronically ill  HEENT: Icteric sclera sclera  Cardiovascular: Regular rate  Pulmonary: Normal work of breathing. Acyanotic  Abdominal: soft, mild distention, no fluid wave, no masses or organomegaly.  Musculoskeletal: No temporal wasting. bilateral pitting edema of LEs  Skin: + Jaundice  Neurologic: Alert, oriented to place/year, did not know date. Slow to answer questions.  Psychiatric: Appropriate.  ??    Diagnostic Studies:   Labs:  Recent Labs     10/11/19  1125 10/11/19  2302 10/12/19  0520 10/12/19  1717   WBC 2.0*  --  1.6*  --    HGB 7.1* 6.2* 6.1* 7.5*   HCT 22.1* 19.8* 19.0* 23.1*   PLT 43*  --  33*  --      Recent Labs     10/11/19  1125 10/12/19  0520   NA 129* 133*   K 3.0* 3.9   CL 90* 98   BUN 39* 50*   CREATININE 1.64* 2.02*   GLU 393* 201*     Recent Labs     10/11/19  1125 10/12/19 0520   PROT 5.4* 5.2*   ALBUMIN 3.0* 3.2*   AST 68* 65*   ALT 31 27   ALKPHOS 69 71   BILITOT 3.5* 2.4*     Recent Labs     10/11/19  1125 10/12/19  0520   INR 1.71 1.82

## 2019-10-14 LAB — CBC W/ AUTO DIFF
BASOPHILS ABSOLUTE COUNT: 0 10*9/L (ref 0.0–0.1)
BASOPHILS RELATIVE PERCENT: 0.9 %
EOSINOPHILS ABSOLUTE COUNT: 0 10*9/L (ref 0.0–0.4)
EOSINOPHILS RELATIVE PERCENT: 3.3 %
HEMATOCRIT: 22.2 % — ABNORMAL LOW (ref 36.0–46.0)
HEMOGLOBIN: 7.1 g/dL — ABNORMAL LOW (ref 12.0–16.0)
LYMPHOCYTES ABSOLUTE COUNT: 0.4 10*9/L — ABNORMAL LOW (ref 1.5–5.0)
LYMPHOCYTES RELATIVE PERCENT: 34.3 %
MEAN CORPUSCULAR HEMOGLOBIN CONC: 31.8 g/dL (ref 31.0–37.0)
MEAN PLATELET VOLUME: 15.1 fL — ABNORMAL HIGH (ref 7.0–10.0)
MONOCYTES ABSOLUTE COUNT: 0.1 10*9/L — ABNORMAL LOW (ref 0.2–0.8)
MONOCYTES RELATIVE PERCENT: 8.3 %
NEUTROPHILS ABSOLUTE COUNT: 0.6 10*9/L — ABNORMAL LOW (ref 2.0–7.5)
NEUTROPHILS RELATIVE PERCENT: 49.2 %
PLATELET COUNT: 27 10*9/L — ABNORMAL LOW (ref 150–440)
RED BLOOD CELL COUNT: 2.56 10*12/L — ABNORMAL LOW (ref 4.00–5.20)
RED CELL DISTRIBUTION WIDTH: 24.2 % — ABNORMAL HIGH (ref 12.0–15.0)
WBC ADJUSTED: 1.2 10*9/L — ABNORMAL LOW (ref 4.5–11.0)

## 2019-10-14 LAB — COMPREHENSIVE METABOLIC PANEL
ALBUMIN: 4 g/dL (ref 3.5–5.0)
ALKALINE PHOSPHATASE: 54 U/L (ref 38–126)
ALT (SGPT): 21 U/L (ref <35)
ANION GAP: 10 mmol/L (ref 7–15)
AST (SGOT): 43 U/L — ABNORMAL HIGH (ref 14–38)
BLOOD UREA NITROGEN: 59 mg/dL — ABNORMAL HIGH (ref 7–21)
BUN / CREAT RATIO: 40
CALCIUM: 9.8 mg/dL (ref 8.5–10.2)
CHLORIDE: 102 mmol/L (ref 98–107)
CO2: 24 mmol/L (ref 22.0–30.0)
CREATININE: 1.48 mg/dL — ABNORMAL HIGH (ref 0.60–1.00)
EGFR CKD-EPI AA FEMALE: 43 mL/min/{1.73_m2} — ABNORMAL LOW (ref >=60)
EGFR CKD-EPI NON-AA FEMALE: 37 mL/min/{1.73_m2} — ABNORMAL LOW (ref >=60)
GLUCOSE RANDOM: 107 mg/dL (ref 70–179)
POTASSIUM: 3.6 mmol/L (ref 3.5–5.0)
PROTEIN TOTAL: 5.8 g/dL — ABNORMAL LOW (ref 6.5–8.3)
SODIUM: 136 mmol/L (ref 135–145)

## 2019-10-14 LAB — EGFR CKD-EPI AA FEMALE
Glomerular filtration rate/1.73 sq M.predicted.black:ArVRat:Pt:Ser/Plas/Bld:Qn:Creatinine-based formula (CKD-EPI): 43 — ABNORMAL LOW

## 2019-10-14 LAB — PROTIME-INR: PROTIME: 20.1 s — ABNORMAL HIGH (ref 10.5–13.5)

## 2019-10-14 LAB — HEMOGLOBIN: Hemoglobin:MCnc:Pt:Bld:Qn:: 7.6 — ABNORMAL LOW

## 2019-10-14 LAB — SMEAR REVIEW

## 2019-10-14 LAB — SLIDE REVIEW

## 2019-10-14 LAB — MAGNESIUM: Magnesium:MCnc:Pt:Ser/Plas:Qn:: 2.1

## 2019-10-14 LAB — EBV VIRAL LOAD RESULT: Lab: NOT DETECTED

## 2019-10-14 LAB — PHOSPHORUS: Phosphate:MCnc:Pt:Ser/Plas:Qn:: 3.1

## 2019-10-14 LAB — BASOPHILS ABSOLUTE COUNT: Basophils:NCnc:Pt:Bld:Qn:Automated count: 0

## 2019-10-14 LAB — INR: Coagulation tissue factor induced.INR:RelTime:Pt:PPP:Qn:Coag: 1.73

## 2019-10-14 NOTE — Unmapped (Signed)
Internal Medicine (MDW) Progress Note    Assessment & Plan:   Marilyn French is a 64 y.o. female with history of NASH cirrhosis s/p TIPS c/b worsening HE, prior variceal bleed, GAVE, HTN, pHTN, anxiety, chronic microcytic anemia, DM, and recent OSH admission for melena who presents with ongoing melena and worsening encephalopathy c/f GI bleed. Now s/p EGD, colonoscopy tomorrow.    Principal Problem:    Upper GI bleed  Active Problems:    NAFLD (nonalcoholic fatty liver disease)    Cirrhosis (CMS-HCC)    GAVE (gastric antral vascular ectasia)    Hypothyroid    Hepatic encephalopathy (CMS-HCC)    Iron deficiency anemia  Resolved Problems:    * No resolved hospital problems. *      Hepatic Encephalopathy: While there was a period of improvement in mental status on 1/20 immediately post-operatively, her mental status has been worsening since 1/21. She has continued lethargy and is very slow to answer questions. Initial oncern for possible infection but infectious work-up negative to date (blood and urine, no ascites present). Major depression might be a significant contributor to her slowed speech and poor functional status. Additionally, her liver disease may frankly be worsening as suggested by her current MELD.   - Hepatology following, appreciate recs   - Continue lactulose to 40 mg QID - titrate to 3-5 BMs daily as she's had 8 stools in the last 24 hours.   - Continue Rifaxan     Melena - Microcytic Anemia: Ongoing problem for her in the past but reports worsening melena that prompted presentation to OSH. Initial hemoglobin there was 5.8 on 1/11 and she ultimately req'd 4U RBCs over the course of a 5 day admission. Still reports ongoing melena. Hemoglobin now stable at 8.5, up from 7.7 as early as morning of [place] presentation (albeit on different equipment). No evidence for brisk bleed and low concern for variceal issue given TIPS and clinical stability. Most likely related to GAVE or other upper GI etiology. Hemoglobin slowly decreasing over the course of the last 24 hours. 7.1 this morning. D/w hepatology who are recommending colonoscopy tomorrow, 1/27, given patient's history of AVMs. They are hopeful that if there are any AVMs that they can APC, this could help to avoid such frequent need for transfusions in the future.   - Clear liquid diet   - Plan to prep with golytely 4L tonight and 2L at 5am tomorrow   - See hepatology recs as above  - Transfuse 1u pRBC with goal of hgb >7.0  - CBC daily  - PO ppi BID     AKI - electrolyte derangements: Cr improved to 1.48 this morning. Baseline ~1 on 1/23. Vitamin D and ionized calcium wnl. Thyroid studies wnl. PTN wnl. Calcium slowly downtrending   - Repeat 1g/kg albumin today per hepatology. Will plan to hold tomorrow given albumin of 4.0 today.   - holding diuresis    Anxiety/Depression: Pt endorses major sadness and depressive symptoms in regards to her poor condition and low candidacy for transplant. Offered to have palliative care team to come by to discuss goals of care going forward should she need to be hospitalized again in the future. Patient is quite resistant to this and states that she really would rather no have that conversation during this admission.  - continue Home Buspar   - Increase zoloft to 100 daily      NASH Cirrhosis s/p TIPS: Meld actually lower today at 17 and most recent clinic  visit with hepatology in October last year. Has been turned down twice for LFT. Follows with Dr. Raford Pitcher. Overall appears well compensated other than encephalopathy and with volume status actually much improved per patient and discharge summary from OSH; down 12 lbs since presentation on 1/11 with IV diuresis and was transitioned to home bumex 2 mg BID in addition to new-start metolazone 5 mg daily. Reach out to hepatology in the morning. Patient having multiple BMs since yesterday. No obvious hematochezia or melena noted.   - HOLD diuretics: bumex 2 mg BID, aldactone 100 mg daily, metolazone 5 mg daily  ??  MELD-Na score: 21 at 10/14/2019  5:00 AM  MELD score: 20 at 10/14/2019  5:00 AM  Calculated from:  Serum Creatinine: 1.48 mg/dL at 1/61/0960  4:54 AM  Serum Sodium: 136 mmol/L at 10/14/2019  5:00 AM  Total Bilirubin: 2.5 mg/dL at 0/98/1191  4:78 AM  INR(ratio): 1.73 at 10/14/2019  5:00 AM  Age: 59 years 5 months    DM: Was receiving lantus 55U in AM, 10U in PM at OSH; give 45U at bedtime (prior home dose) in setting of poor PO intake. Sugars with continued elevation overnight and this morning.   - Continue lantus 60U  - SSI  ??  Hypothyroidism: home synthroid  HLD: crestor --> lipitor per formulary  ??  Code status confirmed full code on arrival and with husband as healthcare proxy.   ??  Daily Checklist:  Diet: Regular Diet  DVT PPx: SCDs   GI PPx: PPI  Dispo: Admit to MDW and Continue Routine Care    Subjective:   Patient reports she feels the same this morning as she did yesterday. She complains of occasional RUQ abdominal pain. She has no new complaints.     Objective:   Temp:  [36.7 ??C-36.9 ??C] 36.8 ??C  Heart Rate:  [66-97] 66  Resp:  [16-20] 18  BP: (104-119)/(35-48) 117/48  SpO2:  [95 %-99 %] 97 %    Gen: Chronically ill-appearing, appears older than stated age. Slow to respond to questions, but faster speech when talking about her depressive symptoms.  HEENT: atraumatic, sclera anicteric, MMM. OP w/o erythema or exudate   Heart: RRR, S1, S2, no M/R/G, no chest wall tenderness  Lungs: CTAB, no crackles or wheezes, no use of accessory muscles  Abdomen: Normoactive bowel sounds, soft, NTND, no rebound/guarding. No appreciable fluid wave on exam.   Extremities: no clubbing, cyanosis. 2+ pitting edema to bilateral lower extremities. Subtle asterixis on exam  Neuro: a&o x3. She appears somnolent     Labs/Studies: Labs and Studies from the last 24hrs per EMR and Reviewed

## 2019-10-14 NOTE — Unmapped (Signed)
HEPATOLOGY INPATIENT FOLLOW UP NOTE     Requesting Attending Physician:  Reece Packer, *    Reason for Consult:  [phone] w/hx of NAFLD cirrhosis (d/b ascites, HE, EV bleeding), s/p TIPS 06/2018 and embolization of mesocaval varices, w/subsequent worsening HE, also c/b GAVE requiring multiple episodes APC, who was admitted w/AMS and melena.     EGD with 1/19 w/PHG, duodenopathy and severe GAVE, treated with APC. TIPS ultrasound showed elevated velocities c/w mild stenosis but without significant step off. Course now c/b hyponatremia and AKI thought to be 2/2 dehydration/poor PO intake.     Seen in consultation at the request of Dr. Ceasar Mons Elder Love, * for encephalopathy    Interval History:  -Spoke with husband yesterday about overall transplant evaluation and plan.  Discussed that patient will need to complete some physical therapy and gain some strength most likely, but we would reassess her frailty regularly.  -Yesterday, she received albumin and diuretics were held.  -1 bowel movement and 250 cc of urine were recorded yesterday.  -Vital signs are notable for stable systolic blood pressures, however her diastolics are low in the 30s to 40s, she has been afebrile.  -Labs today are notable for creatinine of 1.48, which is stable from yesterday. Hgb 7.1 slightly downtrend from 7.3.  Sodium has improved to 136.  -CMV quant is negative.    Colonoscopy at [facility]: 03/2016 -->  Findings:  A few small angiodysplastic lesions without bleeding were found in the   sigmoid colon, in the descending colon and in the transverse colon.  A 4 mm polyp was found in the sigmoid colon. The polyp was sessile. The   polyp was removed with a cold snare. Resection and retrieval were   complete.  External hemorrhoids were found during retroflexion. The hemorrhoids   were small.  Impression: - A few non-bleeding colonic angiodysplastic   lesions.  - One 4 mm polyp in the sigmoid colon, removed with   a cold snare. Resected and retrieved.  - External hemorrhoids.      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  Her last colonoscopy was in 2017 and showed multiple angiectasia's and she has not had this repeated.  She could have chronic oozing from transverse colon or right-sided angiectasia's and warrants colonoscopy for further evaluation, and to see if these lesions can be treated and reduce frequency of blood transfusions. This can be done as an outpatient.   -No colonoscopy tomorrow given COVID+ and this is a chronic source of bleeding and patient is HD stable.     #Transplant Evaluation  Regarding her frailty, she was evaluated in 2019 with [facility] Frailty Index which was 4.59, consistent with significant frailty.  - We will schedule outpatient frailty evaluation in the next few weeks as an outpatient, as she is unlikely to score well while inpatient after her acute illness.    #Encephalopathy - Improving: Suspect HE in s/o dehydration/over-diuresis > infection (though CXR could be c/w PNA), though has gotten better with albumin/rehydration/holding diuretics.   Plan:  - Hypercalcemia has improved with hydration  - Infectious workup thus far negative aside from CXR w/?atlectasis vs infection. Though improved without ABx  -Continue lactulose 3 times daily, goal 4-5 bowel movements daily    #AKI: Suspect pre-renal given response to albumin, holding diuretics. Differential also includes infection related-AKI,  less likely HRS in the setting of TIPS and minimal ascites.   Plan  -Please continue to hold diuretics, including metolazone and spironolactone  - Strict Is/Os  - Continue low Na diet    MELD-Na score: 21 at 10/14/2019  5:00 AM  MELD score: 20 at 10/14/2019  5:00 AM  Calculated from:  Serum Creatinine: 1.48 mg/dL at 2/95/6213  0:86 AM  Serum Sodium: 136 mmol/L at 10/14/2019  5:00 AM  Total Bilirubin: 2.5 mg/dL at 5/78/4696  2:95 AM  INR(ratio): 1.73 at 10/14/2019  5:00 AM  Age: 64 years 5 months    Please page [phone] for further questions/concerns. Patient seen and discussed with Dr. Arlyss Repress, MD  Gastroenterology Fellow, PGY-4  [facility]           Medications:  Current Facility-Administered Medications   Medication Dose Route Frequency Provider Last Rate Last Admin   ??? atorvastatin (LIPITOR) tablet 10 mg  10 mg Oral Nightly Damien Fusi, MD   10 mg at 10/13/19 2032   ??? busPIRone (BUSPAR) tablet 10 mg  10 mg Oral BID Damien Fusi, MD   10 mg at 10/13/19 2032   ??? dextrose 50 % in water (D50W) 50 % solution 12.5 g  12.5 g Intravenous Q10 Min PRN Damien Fusi, MD       ??? insulin glargine (LANTUS) injection 60 Units  60 Units Subcutaneous Daily Hinda Lenis, MD   60 Units at 10/13/19 1002   ??? insulin lispro (HumaLOG) injection 0-12 Units  0-12 Units Subcutaneous ACHS Damien Fusi, MD   8 Units at 10/13/19 1205   ??? lactated Ringers infusion  10 mL/hr Intravenous Continuous Pia Mau, MD   Stopped at 10/09/19 [id number]   ??? lactulose (CHRONULAC) oral solution (30 mL cup)  40 g Oral TID Suzan Slick, MD   Stopped at 10/12/19 2145   ??? levothyroxine (SYNTHROID) tablet 25 mcg  25 mcg Oral Daily Damien Fusi, MD   25 mcg at 10/13/19 3244   ??? pantoprazole (PROTONIX) EC tablet 40 mg  40 mg Oral BID Hinda Lenis, MD   40 mg at 10/13/19 2032   ??? rifAXIMin (XIFAXAN) tablet 550 mg  550 mg Oral BID Damien Fusi, MD   550 mg at 10/13/19 2032   ??? sertraline (ZOLOFT) tablet 50 mg  50 mg Oral Daily Damien Fusi, MD   50 mg at 10/13/19 0947   ??? simethicone (MYLICON) chewable tablet 80 mg  80 mg Oral Q6H PRN Hinda Lenis, MD           Vital Signs:  Temp:  [36.7 ??C-36.9 ??C] 36.8 ??C  Heart Rate:  [67-76] 73  Resp:  [16-20] 16  BP: (104-119)/(35-40) 104/38  MAP (mmHg):  [55-63] 55  SpO2:  [95 %-99 %] 95 %    Intake/Output last 3 shifts: I/O last 3 completed shifts:  In: 880 [P.O.:880]  Out: 700 [Urine:250; Stool:450]    Physical Exam:  General appearance: Appears chronically ill  HEENT: Icteric sclera sclera  Cardiovascular: Regular rate  Pulmonary: Normal work of breathing. Acyanotic  Abdominal: soft, mild distention, no fluid wave, no masses or organomegaly.  Musculoskeletal: No temporal wasting. bilateral pitting edema of LEs  Skin: + Jaundice  Neurologic: Alert, oriented to place/year, did not know date. Slow to answer questions.  Psychiatric: Appropriate.  ??    Diagnostic Studies:   Labs:  Recent Labs  10/11/19  1125 10/11/19  1125 10/12/19  0520 10/12/19  1717 10/13/19  0834 10/13/19  1929   WBC 2.0*  --  1.6*  --  1.6*  --    HGB 7.1*   < > 6.1* 7.5* 7.5* 7.3*   HCT 22.1*   < > 19.0* 23.1* 23.7* 22.1*   PLT 43*  --  33*  --  30*  --     < > = values in this interval not displayed.     Recent Labs     10/12/19  0520 10/13/19  0845 10/14/19  0500   NA 133* 132* 136   K 3.9 3.9 3.6   CL 98 104 102   BUN 50* 55* 59*   CREATININE 2.02* 1.48* 1.48*   GLU 201* 129 107     Recent Labs     10/12/19  0520 10/13/19  0845 10/14/19  0500   PROT 5.2* 5.8* 5.8*   ALBUMIN 3.2* 3.6 4.0   AST 65* 54* 43*   ALT 27 25 21    ALKPHOS 71 60 54   BILITOT 2.4* 2.8* 2.5*     Recent Labs     10/12/19  0520 10/13/19  0845 10/14/19  0500   INR 1.82 1.84 1.73

## 2019-10-14 NOTE — Unmapped (Signed)
Afebrile. BP 110/30 (team aware). Pale/Jaudice. Scheduled medicines were given. Eating and drinking throughout the day. Stool x 1. Labs drawn at change of shift. Around 1800, albumin was started. Patient stated that she was tired and wanted to rest. WCTM.   Problem: Adult Inpatient Plan of Care  Goal: Plan of Care Review  Outcome: Progressing  Goal: Patient-Specific Goal (Individualization)  Outcome: Progressing  Goal: Absence of Hospital-Acquired Illness or Injury  Outcome: Progressing  Goal: Optimal Comfort and Wellbeing  Outcome: Progressing  Goal: Readiness for Transition of Care  Outcome: Progressing  Goal: Rounds/Family Conference  Outcome: Progressing     Problem: Fall Injury Risk  Goal: Absence of Fall and Fall-Related Injury  Outcome: Progressing     Problem: Self-Care Deficit  Goal: Improved Ability to Complete Activities of Daily Living  Outcome: Progressing     Problem: Skin Injury Risk Increased  Goal: Skin Health and Integrity  Outcome: Progressing     Problem: Diabetes Comorbidity  Goal: Blood Glucose Level Within Desired Range  Outcome: Progressing

## 2019-10-15 DIAGNOSIS — K7290 Hepatic failure, unspecified without coma: Secondary | ICD-10-CM | POA: Diagnosis not present

## 2019-10-15 LAB — CBC W/ AUTO DIFF
BASOPHILS ABSOLUTE COUNT: 0 10*9/L (ref 0.0–0.1)
BASOPHILS RELATIVE PERCENT: 1.1 %
EOSINOPHILS ABSOLUTE COUNT: 0 10*9/L (ref 0.0–0.4)
HEMATOCRIT: 24.6 % — ABNORMAL LOW (ref 36.0–46.0)
HEMOGLOBIN: 8.3 g/dL — ABNORMAL LOW (ref 12.0–16.0)
LARGE UNSTAINED CELLS: 4 % (ref 0–4)
LYMPHOCYTES ABSOLUTE COUNT: 0.3 10*9/L — ABNORMAL LOW (ref 1.5–5.0)
MEAN CORPUSCULAR HEMOGLOBIN CONC: 33.9 g/dL (ref 31.0–37.0)
MEAN CORPUSCULAR HEMOGLOBIN: 29.3 pg (ref 26.0–34.0)
MEAN CORPUSCULAR VOLUME: 86.5 fL (ref 80.0–100.0)
MEAN PLATELET VOLUME: 14.1 fL — ABNORMAL HIGH (ref 7.0–10.0)
MONOCYTES ABSOLUTE COUNT: 0.2 10*9/L (ref 0.2–0.8)
MONOCYTES RELATIVE PERCENT: 13.6 %
NEUTROPHILS ABSOLUTE COUNT: 0.6 10*9/L — ABNORMAL LOW (ref 2.0–7.5)
NEUTROPHILS RELATIVE PERCENT: 51.6 %
PLATELET COUNT: 24 10*9/L — ABNORMAL LOW (ref 150–440)
RED BLOOD CELL COUNT: 2.85 10*12/L — ABNORMAL LOW (ref 4.00–5.20)
RED CELL DISTRIBUTION WIDTH: 23.7 % — ABNORMAL HIGH (ref 12.0–15.0)
WBC ADJUSTED: 1.2 10*9/L — ABNORMAL LOW (ref 4.5–11.0)

## 2019-10-15 LAB — CBC
HEMATOCRIT: 24.3 % — ABNORMAL LOW (ref 36.0–46.0)
HEMOGLOBIN: 8 g/dL — ABNORMAL LOW (ref 12.0–16.0)
MEAN CORPUSCULAR HEMOGLOBIN CONC: 33 g/dL (ref 31.0–37.0)
MEAN CORPUSCULAR HEMOGLOBIN: 28.5 pg (ref 26.0–34.0)
MEAN CORPUSCULAR VOLUME: 86.4 fL (ref 80.0–100.0)
MEAN PLATELET VOLUME: 12.8 fL — ABNORMAL HIGH (ref 7.0–10.0)
PLATELET COUNT: 22 10*9/L — ABNORMAL LOW (ref 150–440)
RED CELL DISTRIBUTION WIDTH: 23.5 % — ABNORMAL HIGH (ref 12.0–15.0)

## 2019-10-15 LAB — HEPATITIS C ANTIBODY: Hepatitis C virus Ab:PrThr:Pt:Ser:Ord:: NONREACTIVE

## 2019-10-15 LAB — COMPREHENSIVE METABOLIC PANEL
ALBUMIN: 4.4 g/dL (ref 3.5–5.0)
ALT (SGPT): 21 U/L (ref <35)
ANION GAP: 11 mmol/L (ref 7–15)
AST (SGOT): 42 U/L — ABNORMAL HIGH (ref 14–38)
BILIRUBIN TOTAL: 3.4 mg/dL — ABNORMAL HIGH (ref 0.0–1.2)
BLOOD UREA NITROGEN: 51 mg/dL — ABNORMAL HIGH (ref 7–21)
BUN / CREAT RATIO: 38
CALCIUM: 9.7 mg/dL (ref 8.5–10.2)
CHLORIDE: 107 mmol/L (ref 98–107)
CO2: 21 mmol/L — ABNORMAL LOW (ref 22.0–30.0)
CREATININE: 1.35 mg/dL — ABNORMAL HIGH (ref 0.60–1.00)
EGFR CKD-EPI AA FEMALE: 48 mL/min/{1.73_m2} — ABNORMAL LOW (ref >=60)
GLUCOSE RANDOM: 89 mg/dL (ref 70–179)
POTASSIUM: 3.3 mmol/L — ABNORMAL LOW (ref 3.5–5.0)
PROTEIN TOTAL: 6.7 g/dL (ref 6.5–8.3)
SODIUM: 139 mmol/L (ref 135–145)

## 2019-10-15 LAB — PHOSPHORUS: Phosphate:MCnc:Pt:Ser/Plas:Qn:: 3.3

## 2019-10-15 LAB — MAGNESIUM: Magnesium:MCnc:Pt:Ser/Plas:Qn:: 2.2

## 2019-10-15 LAB — MEAN CORPUSCULAR VOLUME: Erythrocyte mean corpuscular volume:EntVol:Pt:RBC:Qn:Automated count: 86.4

## 2019-10-15 LAB — HEMOGLOBIN: Hemoglobin:MCnc:Pt:Bld:Qn:: 7.3 — ABNORMAL LOW

## 2019-10-15 LAB — ALBUMIN: Albumin:MCnc:Pt:Ser/Plas:Qn:: 4.4

## 2019-10-15 LAB — NEUTROPHILS ABSOLUTE COUNT: Neutrophils:NCnc:Pt:Bld:Qn:Automated count: 0.6 — ABNORMAL LOW

## 2019-10-15 LAB — PATIENT NEEDLESTICK PACKAGE
HCV RNA: NOT DETECTED
HIV ANTIGEN/ANTIBODY COMBO: NONREACTIVE

## 2019-10-15 LAB — INR: Coagulation tissue factor induced.INR:RelTime:Pt:PPP:Qn:Coag: 1.69

## 2019-10-15 NOTE — Unmapped (Signed)
Patient alert and oriented x4. VSS throughout shift. Pt in bed with bed in low and locked position and call bell in reach. Bed alarm on. No neuro changes noted. No falls during this shift. Pt denies any pain. Patient drinking Go-lyte. I bag of platelets were given. Chest port intact Will continue to monitor.     Problem: Adult Inpatient Plan of Care  Goal: Plan of Care Review  Outcome: Progressing  Goal: Patient-Specific Goal (Individualization)  Outcome: Progressing  Flowsheets (Taken 10/15/2019 0154)  Patient-Specific Goals (Include Timeframe): patient will have a colonoscopy in th AM  Goal: Absence of Hospital-Acquired Illness or Injury  Outcome: Progressing  Goal: Optimal Comfort and Wellbeing  Outcome: Progressing  Goal: Readiness for Transition of Care  Outcome: Progressing  Goal: Rounds/Family Conference  Outcome: Progressing

## 2019-10-15 NOTE — Unmapped (Signed)
HEPATOLOGY INPATIENT FOLLOW UP NOTE     Requesting Attending Physician:  Reece Packer, *    Reason for Consult:  [phone] w/hx of NAFLD cirrhosis (d/b ascites, HE, EV bleeding), s/p TIPS 06/2018 and embolization of mesocaval varices, w/subsequent worsening HE, also c/b GAVE requiring multiple episodes APC, who was admitted w/AMS and melena.     EGD with 1/19 w/PHG, duodenopathy and severe GAVE, treated with APC. TIPS ultrasound showed elevated velocities c/w mild stenosis but without significant step off. Course now c/b hyponatremia and AKI thought to be 2/2 dehydration/poor PO intake.     Seen in consultation at the request of Dr. Ceasar Mons Elder Love, * for encephalopathy    Interval History:  -Patient tested positive for Covid yesterday, however was noted to have a positive Covid test at [facility] on December 9, and thus infection prevention has considered her a resolved infection and she is not on any infection precautions.  -No fevers, vital signs stable.  -4 bowel movements are recorded.  -PT is recommending SNF  -Labs today notable for Cr 1.35 (downtrending), stable hgb at 8.3, WBC 1.2  - Feels OK - similar to yesterday.     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  Her last colonoscopy was in 2017 and showed multiple angiectasia's and she has not had this repeated.  She could have chronic oozing from transverse colon or right-sided angiectasia's and warrants colonoscopy for further evaluation, and to see if these lesions can be treated and reduce frequency of blood transfusions. This can be done as an outpatient.   - Colonoscopy 10/16/19 - She has a RESOLVED COVID infection (documented 08/27/19, and per infection control is considered RESOLVED). She is not considered to be contagious and is not on any isolation precuations.   - Golytely 4L this afternoon/evening, another 1-2 L at 4-5 AM  - NPO at MN aside from colonoscopy prep  - 1 unit of platelets in AM    #Transplant Evaluation  Regarding her frailty, she was evaluated in 2019 with [facility] Frailty Index which was 4.59, consistent with significant frailty.  - We will schedule outpatient frailty evaluation in the next few weeks as an outpatient, as she is unlikely to score well while inpatient after her acute illness.    #Encephalopathy - Improving: Suspect HE in s/o dehydration/over-diuresis > infection (though CXR could be c/w PNA), though has gotten better with albumin/rehydration/holding diuretics.   Plan:  - Hypercalcemia has improved with hydration  - Infectious workup thus far negative aside from CXR w/?atlectasis vs infection. Though improved without ABx  -Continue lactulose 3 times daily, goal 4-5 bowel movements daily    #AKI: Suspect pre-renal given response to albumin, holding diuretics. Differential also includes infection related-AKI, less likely HRS in the setting of TIPS and minimal ascites.   Plan  -Please continue to hold diuretics, including metolazone and spironolactone  - Strict Is/Os  - Continue low Na diet    MELD-Na score: 20 at 10/15/2019  6:41 AM  MELD score: 20 at 10/15/2019  6:41 AM  Calculated from:  Serum Creatinine: 1.35 mg/dL at 0/96/0454  0:98 AM  Serum Sodium: 139 mmol/L (Rounded to 137 mmol/L) at 10/15/2019  6:41 AM  Total Bilirubin: 3.4 mg/dL at 10/06/1476  2:95 AM  INR(ratio): 1.69 at 10/15/2019  6:41 AM  Age: 64 years 5 months  Please page [phone] for further questions/concerns. Patient seen and discussed with Dr. Arlyss Repress, MD  Gastroenterology Fellow, PGY-4  [facility]           Medications:  Current Facility-Administered Medications   Medication Dose Route Frequency Provider Last Rate Last Admin   ??? atorvastatin (LIPITOR) tablet 10 mg  10 mg Oral Nightly Margaretha Seeds, MD   10 mg at 10/14/19 2033   ??? busPIRone (BUSPAR) tablet 10 mg  10 mg Oral BID Margaretha Seeds, MD   10 mg at 10/15/19 0820   ??? dextrose 50 % in water (D50W) 50 % solution 12.5 g  12.5 g Intravenous Q10 Min PRN Margaretha Seeds, MD       ??? insulin glargine (LANTUS) injection 60 Units  60 Units Subcutaneous Daily Margaretha Seeds, MD   60 Units at 10/15/19 0830   ??? insulin lispro (HumaLOG) injection 0-12 Units  0-12 Units Subcutaneous ACHS Margaretha Seeds, MD   4 Units at 10/14/19 1155   ??? lactated Ringers infusion  10 mL/hr Intravenous Continuous Margaretha Seeds, MD   Stopped at 10/09/19 [id number]   ??? lactulose (CHRONULAC) oral solution (30 mL cup)  40 g Oral 4x Daily Margaretha Seeds, MD   40 g at 10/14/19 2032   ??? levothyroxine (SYNTHROID) tablet 25 mcg  25 mcg Oral Daily Margaretha Seeds, MD   25 mcg at 10/15/19 0820   ??? pantoprazole (PROTONIX) EC tablet 40 mg  40 mg Oral BID Margaretha Seeds, MD   40 mg at 10/15/19 0820   ??? rifAXIMin (XIFAXAN) tablet 550 mg  550 mg Oral BID Margaretha Seeds, MD   550 mg at 10/15/19 0820   ??? sertraline (ZOLOFT) tablet 100 mg  100 mg Oral Daily Margaretha Seeds, MD   100 mg at 10/15/19 0820   ??? simethicone (MYLICON) chewable tablet 80 mg  80 mg Oral Q6H PRN Margaretha Seeds, MD       ??? sodium chloride (NS) 0.9 % infusion   Intravenous Continuous Hinda Lenis, MD   1,000 mL at 10/15/19 0536       Vital Signs:  Temp:  [36.4 ??C-37.3 ??C] 37.3 ??C  Heart Rate:  [65-72] 68  Resp:  [16-18] 18  BP: (101-127)/(33-57) 117/38  MAP (mmHg):  [60-73] 60  SpO2:  [97 %-100 %] 99 %    Intake/Output last 3 shifts:  I/O last 3 completed shifts:  In: 1252 [P.O.:480; Blood:772]  Out: 250 [Urine:250]    Physical Exam:  General appearance: Appears chronically ill  HEENT: Icteric sclera sclera  Cardiovascular: Regular rate  Pulmonary: Normal work of breathing. Acyanotic  Abdominal: soft, mild distention, no fluid wave, no masses or organomegaly.  Musculoskeletal: No temporal wasting. bilateral pitting edema of LEs  Skin: + Jaundice  Neurologic: Alert, oriented to place/year, did not know date. Slow to answer questions.  Psychiatric: Appropriate.  ??    Diagnostic Studies:   Labs:  Recent Labs     10/14/19  0818 10/14/19  1532 10/15/19  0049 10/15/19  0341   WBC 1.2*  --  1.1* 1.2*   HGB 7.1* 7.6* 8.0* 8.3*   HCT 22.2* 23.6* 24.3* 24.6*   PLT 27*  --  22* 24*     Recent Labs     10/13/19  0845 10/14/19  0500 10/15/19  0641   NA 132* 136 139   K 3.9  3.6 3.3*   CL 104 102 107   BUN 55* 59* 51*   CREATININE 1.48* 1.48* 1.35*   GLU 129 107 89     Recent Labs     10/13/19  0845 10/14/19  0500 10/15/19  0641   PROT 5.8* 5.8* 6.7   ALBUMIN 3.6 4.0 4.4   AST 54* 43* 42*   ALT 25 21 21    ALKPHOS 60 54 62   BILITOT 2.8* 2.5* 3.4*     Recent Labs     10/13/19  0845 10/14/19  0500 10/15/19  0641   INR 1.84 1.73 1.69     Colonoscopy at [facility]: 03/2016 -->  Findings:  A few small angiodysplastic lesions without bleeding were found in the   sigmoid colon, in the descending colon and in the transverse colon.  A 4 mm polyp was found in the sigmoid colon. The polyp was sessile. The   polyp was removed with a cold snare. Resection and retrieval were   complete.  External hemorrhoids were found during retroflexion. The hemorrhoids   were small.  Impression: - A few non-bleeding colonic angiodysplastic   lesions.  - One 4 mm polyp in the sigmoid colon, removed with   a cold snare. Resected and retrieved.  - External hemorrhoids.

## 2019-10-15 NOTE — Unmapped (Signed)
Internal Medicine (MDW) Progress Note    Assessment & Plan:   Marilyn French is a 64 y.o. female with history of NASH cirrhosis s/p TIPS c/b worsening HE, prior variceal bleed, GAVE, HTN, pHTN, anxiety, chronic microcytic anemia, DM, and recent OSH admission for melena who presents with ongoing melena and worsening encephalopathy c/f GI bleed. Now s/p EGD, colonoscopy tomorrow.    Principal Problem:    Upper GI bleed  Active Problems:    NAFLD (nonalcoholic fatty liver disease)    Cirrhosis (CMS-HCC)    GAVE (gastric antral vascular ectasia)    Hypothyroid    Hepatic encephalopathy (CMS-HCC)    Iron deficiency anemia  Resolved Problems:    * No resolved hospital problems. *      Hepatic Encephalopathy: While there was a period of improvement in mental status on 1/20 immediately post-operatively, her mental status has been worsening since 1/21. She has continued lethargy and is very slow to answer questions. Initial oncern for possible infection but infectious work-up negative to date (blood and urine, no ascites present). Major depression might be a significant contributor to her slowed speech and poor functional status. Additionally, her liver disease may frankly be worsening as suggested by her current MELD.   - Hepatology following, appreciate recs   - Continue lactulose to 40 mg QID - titrate to 3-5 BMs daily as she's had 8 stools in the last 24 hours.   - Continue Rifaxan     Melena - Microcytic Anemia: Ongoing problem for her in the past but reports worsening melena that prompted presentation to OSH. Initial hemoglobin there was 5.8 on 1/11 and she ultimately req'd 4U RBCs over the course of a 5 day admission. Still reports ongoing melena. Hemoglobin now stable at 8.5, up from 7.7 as early as morning of [place] presentation (albeit on different equipment). No evidence for brisk bleed and low concern for variceal issue given TIPS and clinical stability. Most likely related to GAVE or other upper GI etiology. Hemoglobin slowly decreasing over the course of the last 24 hours. 7.1 this morning. D/w hepatology who are recommending colonoscopy tomorrow, 1/27, given patient's history of AVMs. They are hopeful that if there are any AVMs that they can APC, this could help to avoid such frequent need for transfusions in the future.    Patient had COVID swab yesterday prior to colonoscopy which was positive. However, she had a documented positive test on 08/26/2020. Infection prevention was consulted who changed patient's status to resolved infection.   - D/w hepatology- they will plan for colonoscopy tomorrow.   - Clear liquid diet   - Plan to prep with golytely 4L tonight and 2L at 5am tomorrow   - See hepatology recs as above  - Transfuse 1u pRBC with goal of hgb >7.0  - CBC daily  - PO ppi BID     AKI - electrolyte derangements: Cr improved to 1.48 this morning. Baseline ~1 on 1/23. Vitamin D and ionized calcium wnl. Thyroid studies wnl. PTN wnl. Calcium slowly downtrending   - Repeat 1g/kg albumin today per hepatology. Will plan to hold tomorrow given albumin of 4.0 today.   - holding diuresis    Anxiety/Depression: Pt endorses major sadness and depressive symptoms in regards to her poor condition and low candidacy for transplant. Offered to have palliative care team to come by to discuss goals of care going forward should she need to be hospitalized again in the future. Patient is quite resistant to this and  states that she really would rather no have that conversation during this admission.  - continue Home Buspar   - Increase zoloft to 100 daily      NASH Cirrhosis s/p TIPS: Meld actually lower today at 17 and most recent clinic visit with hepatology in October last year. Has been turned down twice for LFT. Follows with Dr. Raford Pitcher. Overall appears well compensated other than encephalopathy and with volume status actually much improved per patient and discharge summary from OSH; down 12 lbs since presentation on 1/11 with IV diuresis and was transitioned to home bumex 2 mg BID in addition to new-start metolazone 5 mg daily. Reach out to hepatology in the morning. Patient having multiple BMs since yesterday. No obvious hematochezia or melena noted.   - HOLD diuretics: bumex 2 mg BID, aldactone 100 mg daily, metolazone 5 mg daily  ??  MELD-Na score: 20 at 10/15/2019  6:41 AM  MELD score: 20 at 10/15/2019  6:41 AM  Calculated from:  Serum Creatinine: 1.35 mg/dL at 1/61/0960  4:54 AM  Serum Sodium: 139 mmol/L (Rounded to 137 mmol/L) at 10/15/2019  6:41 AM  Total Bilirubin: 3.4 mg/dL at 0/98/1191  4:78 AM  INR(ratio): 1.69 at 10/15/2019  6:41 AM  Age: 64 years 5 months    DM: Was receiving lantus 55U in AM, 10U in PM at OSH; give 45U at bedtime (prior home dose) in setting of poor PO intake. Sugars with continued elevation overnight and this morning.   - Continue lantus 60U  - SSI  ??  Hypothyroidism: home synthroid  HLD: crestor --> lipitor per formulary  ??  Code status confirmed full code on arrival and with husband as healthcare proxy.   ??  Daily Checklist:  Diet: Regular Diet  DVT PPx: SCDs   GI PPx: PPI  Dispo: Admit to MDW and Continue Routine Care    Subjective:   Patient had several BMs yesterday from bowel prep. She otherwise feels well.     Objective:   Temp:  [36.4 ??C-37.3 ??C] 37.3 ??C  Heart Rate:  [65-72] 68  Resp:  [16-18] 18  BP: (101-127)/(33-57) 117/38  SpO2:  [97 %-100 %] 99 %    Gen: Chronically ill-appearing, appears older than stated age. Slow to respond to questions, but faster speech when talking about her depressive symptoms.  HEENT: atraumatic, sclera anicteric, MMM. OP w/o erythema or exudate   Heart: RRR, S1, S2, no M/R/G, no chest wall tenderness  Lungs: CTAB, no crackles or wheezes, no use of accessory muscles  Abdomen: Normoactive bowel sounds, soft, NTND, no rebound/guarding. No appreciable fluid wave on exam.   Extremities: no clubbing, cyanosis. 2+ pitting edema to bilateral lower extremities. Subtle asterixis on exam  Neuro: a&o x3. She appears somnolent     Labs/Studies: Labs and Studies from the last 24hrs per EMR and Reviewed

## 2019-10-16 LAB — COMPREHENSIVE METABOLIC PANEL
ALBUMIN: 4.1 g/dL (ref 3.5–5.0)
ALKALINE PHOSPHATASE: 61 U/L (ref 38–126)
ALT (SGPT): 18 U/L (ref <35)
ANION GAP: 8 mmol/L (ref 7–15)
AST (SGOT): 40 U/L — ABNORMAL HIGH (ref 14–38)
BILIRUBIN TOTAL: 2.6 mg/dL — ABNORMAL HIGH (ref 0.0–1.2)
BLOOD UREA NITROGEN: 51 mg/dL — ABNORMAL HIGH (ref 7–21)
BUN / CREAT RATIO: 41
CALCIUM: 9.4 mg/dL (ref 8.5–10.2)
CHLORIDE: 107 mmol/L (ref 98–107)
CO2: 22 mmol/L (ref 22.0–30.0)
CREATININE: 1.24 mg/dL — ABNORMAL HIGH (ref 0.60–1.00)
EGFR CKD-EPI AA FEMALE: 53 mL/min/{1.73_m2} — ABNORMAL LOW (ref >=60)
GLUCOSE RANDOM: 92 mg/dL (ref 70–99)
POTASSIUM: 3.7 mmol/L (ref 3.5–5.0)
PROTEIN TOTAL: 6.3 g/dL — ABNORMAL LOW (ref 6.5–8.3)
SODIUM: 137 mmol/L (ref 135–145)

## 2019-10-16 LAB — MAGNESIUM: Magnesium:MCnc:Pt:Ser/Plas:Qn:: 2.1

## 2019-10-16 LAB — HEMOGLOBIN: Hemoglobin:MCnc:Pt:Bld:Qn:: 7.7 — ABNORMAL LOW

## 2019-10-16 LAB — PHOSPHORUS: Phosphate:MCnc:Pt:Ser/Plas:Qn:: 3.4

## 2019-10-16 LAB — PROTIME: Coagulation tissue factor induced:Time:Pt:PPP:Qn:Coag: 18.9 — ABNORMAL HIGH

## 2019-10-16 LAB — CBC
HEMATOCRIT: 24 % — ABNORMAL LOW (ref 36.0–46.0)
HEMOGLOBIN: 7.7 g/dL — ABNORMAL LOW (ref 12.0–16.0)
MEAN CORPUSCULAR HEMOGLOBIN CONC: 32.2 g/dL (ref 31.0–37.0)
MEAN CORPUSCULAR HEMOGLOBIN: 28.2 pg (ref 26.0–34.0)
MEAN CORPUSCULAR VOLUME: 87.5 fL (ref 80.0–100.0)
MEAN PLATELET VOLUME: 13.6 fL — ABNORMAL HIGH (ref 7.0–10.0)
PLATELET COUNT: 31 10*9/L — ABNORMAL LOW (ref 150–440)
RED BLOOD CELL COUNT: 2.74 10*12/L — ABNORMAL LOW (ref 4.00–5.20)
RED CELL DISTRIBUTION WIDTH: 22.3 % — ABNORMAL HIGH (ref 12.0–15.0)
WBC ADJUSTED: 1.4 10*9/L — ABNORMAL LOW (ref 4.5–11.0)

## 2019-10-16 LAB — EGFR CKD-EPI NON-AA FEMALE
Glomerular filtration rate/1.73 sq M.predicted.non black:ArVRat:Pt:Ser/Plas/Bld:Qn:Creatinine-based formula (CKD-EPI): 46 — ABNORMAL LOW

## 2019-10-16 LAB — VITAMIN D 1,25-DIHYDROXY: 1,25-Dihydroxyvitamin D:MCnc:Pt:Ser/Plas:Qn:: <8 — ABNORMAL LOW

## 2019-10-16 LAB — HLA ANTIBODY SCR

## 2019-10-16 NOTE — Unmapped (Addendum)
HEPATOLOGY INPATIENT FOLLOW UP NOTE     Requesting Attending Physician:  Reece Packer, *    Reason for Consult:  [phone] w/hx of NAFLD cirrhosis (d/b ascites, HE, EV bleeding), s/p TIPS 06/2018 and embolization of mesocaval varices, w/subsequent worsening HE, also c/b GAVE requiring multiple episodes APC, who was admitted w/AMS and melena.     EGD with 1/19 w/PHG, duodenopathy and severe GAVE, treated with APC. TIPS ultrasound showed elevated velocities c/w mild stenosis but without significant step off. Course now c/b hyponatremia and AKI thought to be 2/2 dehydration/poor PO intake.     Seen in consultation at the request of Dr. Ceasar Mons Elder Love, * for encephalopathy    Interval History:  -Overnight, no events.  Vital signs have been stable.  Patient declined SNF and would like home PT and home health.  -Labs today are notable for continued downtrending creatinine to 1.24, stable hemoglobin at 7.7.  Meld sodium is only 17 today.  - Colonoscopy pushed until tomorrow in s/o incomplete prep.     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  Her last colonoscopy was in 2017 and showed multiple angiectasia's and she has not had this repeated.  She could have chronic oozing from transverse colon or right-sided angiectasia's and warrants colonoscopy for further evaluation, and to see if these lesions can be treated and reduce frequency of blood transfusions. This can be done as an outpatient.   - Colonoscopy 10/17/19 - She has a RESOLVED COVID infection (documented 08/27/19, and per infection control is considered RESOLVED). She is not considered to be contagious and is not on any isolation precuations.   - Golytely 2L this evening, if stool not clear by 12AM, needs another 4LAM  - NPO at MN aside from colonoscopy prep  - 1 unit of platelets in AM    #Transplant Evaluation  Regarding her frailty, she was evaluated in 2019 with [facility] Frailty Index which was 4.59, consistent with significant frailty.  - We will schedule outpatient frailty evaluation in the next few weeks as an outpatient, as she is unlikely to score well while inpatient after her acute illness.    #Encephalopathy - Improving: Suspect HE in s/o dehydration/over-diuresis > infection (though CXR could be c/w PNA), though has gotten better with albumin/rehydration/holding diuretics.   Plan:  - Hypercalcemia has improved with hydration  - Infectious workup thus far negative aside from CXR w/?atlectasis vs infection. Though improved without ABx  -Continue lactulose 3 times daily, goal 4-5 bowel movements daily    #AKI: Suspect pre-renal given response to albumin, holding diuretics. Differential also includes infection related-AKI, less likely HRS in the setting of TIPS and minimal ascites.   Plan  -Her home regimen includes 2 mg of Bumex twice a day, 100 mg of spironolactone daily, and during this hospitalization was briefly on some metolazone in addition to this. Can restart 2mg  bumex BID on discharge and spironolactone with NO metaolazone, no need to restart in s/o colonoscopy, can restart on discharge.  - Strict Is/Os  - Continue low Na diet    MELD-Na score: 17 at 10/16/2019  5:50 AM  MELD score: 17 at 10/16/2019  5:50 AM  Calculated from:  Serum Creatinine: 1.24 mg/dL at 0/96/0454  0:98 AM  Serum Sodium: 137 mmol/L at 10/16/2019  5:50 AM  Total Bilirubin: 2.6 mg/dL at 10/06/1476  5:50 AM  INR(ratio): 1.63 at 10/16/2019  5:50 AM  Age: 64 years 5 months    Please page [phone] for further questions/concerns. Patient discussed with Dr. Arlyss Repress, MD  Gastroenterology Fellow, PGY-4  [facility]

## 2019-10-16 NOTE — Unmapped (Signed)
Internal Medicine (MDW) Progress Note    Interval History:  - Per nursing, was still finishing her golytely prep this morning. Had 3 stools since am shift started that remain darker and have not yet cleared. Colonoscopy tentatively planned for today with GI. Planning to tranfuse 1u platelets 1 hour prior to procedure. Platelets this morning at 31.   - States she is feeling very overwhelmed and down this morning. She feels easily confused by all the different medications she is being given and different providers she is seeing. She also feels it is difficult for her to keep track of the day/time.  - Cr continuing to downtrend to 1.24. H/H has been stable.     Assessment & Plan:   Marilyn French is a 64 y.o. female with history of NASH cirrhosis s/p TIPS c/b worsening HE, prior variceal bleed, GAVE, HTN, pHTN, anxiety, chronic microcytic anemia, DM, and recent OSH admission for melena who presents with ongoing melena and worsening encephalopathy c/f GI bleed. Now s/p EGD, colonoscopy tomorrow.    Principal Problem:    Upper GI bleed  Active Problems:    NAFLD (nonalcoholic fatty liver disease)    Cirrhosis (CMS-HCC)    GAVE (gastric antral vascular ectasia)    Hypothyroid    Hepatic encephalopathy (CMS-HCC)    Iron deficiency anemia    Thrombocytopenia (CMS-HCC)  Resolved Problems:    * No resolved hospital problems. *    Hepatic Encephalopathy: While there was a period of improvement in mental status on 1/20 immediately post-operatively, her mental status has been worsening since 1/21. She has continued lethargy and is very slow to answer questions. Initial oncern for possible infection but infectious work-up negative to date (blood and urine, no ascites present). Major depression might be a significant contributor to her slowed speech and poor functional status. Additionally, her liver disease may frankly be worsening as suggested by her current MELD.   - Hepatology following, appreciate recs.   - Continue lactulose to 40 mg QID - titrate to 3-5 BMs daily.  - Continue Rifaximin.     Melena - Microcytic Anemia: Ongoing problem for her in the past but reports worsening melena that prompted presentation to OSH. Initial hemoglobin there was 5.8 on 1/11 and she ultimately req'd 4U RBCs over the course of a 5 day admission. Still reports ongoing melena. Hemoglobin now stable at 8.5, up from 7.7 as early as morning of [place] presentation (albeit on different equipment). No evidence for brisk bleed and low concern for variceal issue given TIPS and clinical stability. Most likely related to GAVE or other upper GI etiology. Hemoglobin slowly decreasing over the course of the last 24 hours. 7.1 this morning. D/w hepatology who are recommending colonoscopy tomorrow, 1/27, given patient's history of AVMs. They are hopeful that if there are any AVMs that they can APC, this could help to avoid such frequent need for transfusions in the future. Patient had COVID swab yesterday prior to colonoscopy which was positive. However, she had a documented positive test on 08/26/2020. Infection prevention was consulted who changed patient's status to resolved infection.    - Planning for colonoscopy today.    - See hepatology recs as above  - Transfuse to maintain Hgb >7.   - Continue to monitor with CBC daily.  - Continue PO PPI BID.     AKI - Electrolyte derangements, improving: Cr improved to 1.24 this morning. Baseline ~1 on 1/23. Vitamin D and ionized calcium wnl. Thyroid studies wnl. PTN  wnl. Calcium stable.  - Holding albumin today given albumin of 4.1.  - Holding diuresis.     Anxiety/Depression: Pt endorses major sadness and depressive symptoms in regards to her poor condition and low candidacy for transplant. Offered to have palliative care team to come by to discuss goals of care going forward should she need to be hospitalized again in the future. Patient is quite resistant to this and states that she really would rather not have that conversation during this admission.  - Continue home Buspar.   - Continue zoloft 100mg  daily.     NASH Cirrhosis s/p TIPS: Meld actually lower today at 93 and most recent clinic visit with hepatology in October last year. Has been turned down twice for LFT. Follows with Dr. Raford Pitcher. Overall appears well compensated other than encephalopathy and with volume status actually much improved per patient and discharge summary from OSH; down 12 lbs since presentation on 1/11 with IV diuresis and was transitioned to home bumex 2 mg BID in addition to new-start metolazone 5 mg daily. Reach out to hepatology in the morning. Patient having multiple BMs since yesterday. No obvious hematochezia or melena noted.   - Holding diuretics: bumex 2 mg BID, aldactone 100 mg daily, metolazone 5 mg daily.  ??  MELD-Na score: 17 at 10/16/2019  5:50 AM  MELD score: 17 at 10/16/2019  5:50 AM  Calculated from:  Serum Creatinine: 1.24 mg/dL at 9/52/8413  2:44 AM  Serum Sodium: 137 mmol/L at 10/16/2019  5:50 AM  Total Bilirubin: 2.6 mg/dL at 0/06/2724  3:66 AM  INR(ratio): 1.63 at 10/16/2019  5:50 AM  Age: 64 years 0 months    DM: BG have been better controlled ranging 80-160s.   - Continue lantus 55u and SSI.   ??  Hypothyroidism: Continue home synthroid.   HLD: Continued formulary equivalent of home statin.   ??  Code status: Confirmed full code on arrival and with husband as healthcare proxy.   ??  Daily Checklist:  Diet: Regular Diet  DVT PPx: SCDs   GI PPx: PPI  Dispo: Admit to MDW, floor    Objective:   Temp:  [36.8 ??C-37.2 ??C] 37 ??C  Heart Rate:  [70-73] 70  Resp:  [18] 18  BP: (119-123)/(44-54) 119/50  SpO2:  [95 %-99 %] 95 %    Gen: Chronically ill-appearing, appears older than stated age. Slow to respond to questions.   Heart: RRR. Normal S1 and S2. No appreciable murmurs.   Lungs: CTAB, no increased WOB.   Abdomen: Normoactive bowel sounds, soft, NTND, no rebound/guarding. No appreciable fluid wave on exam.    Extremities: 1+ pitting edema of BL LE. No asterixis on exam.   Neuro: A&o x3.     Labs/Studies: Labs and Studies from the last 24hrs per EMR and Reviewed     Rosiland Oz  Internal Medicine PGY1  Pager [phone]

## 2019-10-16 NOTE — Unmapped (Signed)
Call placed to [facility] regarding appts on 2/15.  Her husband answered and stated Marilyn French was still in house at [facility].  He wrote down the appt and verbally agreed to attend appts with Harriett Sine.  He is also aware aware he can view the appts on MyChart.

## 2019-10-16 NOTE — Unmapped (Signed)
Problem: Adult Inpatient Plan of Care  Goal: Plan of Care Review  Outcome: Ongoing - Unchanged  Goal: Patient-Specific Goal (Individualization)  Outcome: Ongoing - Unchanged  Goal: Absence of Hospital-Acquired Illness or Injury  Outcome: Ongoing - Unchanged  Goal: Optimal Comfort and Wellbeing  Outcome: Ongoing - Unchanged  Goal: Readiness for Transition of Care  Outcome: Ongoing - Unchanged  Goal: Rounds/Family Conference  Outcome: Ongoing - Unchanged     Problem: Self-Care Deficit  Goal: Improved Ability to Complete Activities of Daily Living  Outcome: Ongoing - Unchanged     Problem: Skin Injury Risk Increased  Goal: Skin Health and Integrity  Outcome: Ongoing - Unchanged     Problem: Diabetes Comorbidity  Goal: Blood Glucose Level Within Desired Range  Outcome: Ongoing - Unchanged

## 2019-10-17 DIAGNOSIS — K7460 Unspecified cirrhosis of liver: Principal | ICD-10-CM

## 2019-10-17 LAB — CBC W/ AUTO DIFF
BASOPHILS ABSOLUTE COUNT: 0 10*9/L (ref 0.0–0.1)
BASOPHILS RELATIVE PERCENT: 0.9 %
EOSINOPHILS ABSOLUTE COUNT: 0 10*9/L (ref 0.0–0.4)
EOSINOPHILS RELATIVE PERCENT: 3.6 %
HEMATOCRIT: 23.1 % — ABNORMAL LOW (ref 36.0–46.0)
HEMOGLOBIN: 7.8 g/dL — ABNORMAL LOW (ref 12.0–16.0)
LARGE UNSTAINED CELLS: 6 % — ABNORMAL HIGH (ref 0–4)
LYMPHOCYTES ABSOLUTE COUNT: 0.4 10*9/L — ABNORMAL LOW (ref 1.5–5.0)
LYMPHOCYTES RELATIVE PERCENT: 31.1 %
MEAN CORPUSCULAR HEMOGLOBIN CONC: 33.6 g/dL (ref 31.0–37.0)
MEAN CORPUSCULAR HEMOGLOBIN: 29.4 pg (ref 26.0–34.0)
MEAN CORPUSCULAR VOLUME: 87.3 fL (ref 80.0–100.0)
MEAN PLATELET VOLUME: 10.4 fL — ABNORMAL HIGH (ref 7.0–10.0)
MONOCYTES ABSOLUTE COUNT: 0.1 10*9/L — ABNORMAL LOW (ref 0.2–0.8)
MONOCYTES RELATIVE PERCENT: 9.1 %
NEUTROPHILS ABSOLUTE COUNT: 0.6 10*9/L — ABNORMAL LOW (ref 2.0–7.5)
NEUTROPHILS RELATIVE PERCENT: 49.7 %
RED BLOOD CELL COUNT: 2.65 10*12/L — ABNORMAL LOW (ref 4.00–5.20)
RED CELL DISTRIBUTION WIDTH: 22.7 % — ABNORMAL HIGH (ref 12.0–15.0)
WBC ADJUSTED: 1.1 10*9/L — ABNORMAL LOW (ref 4.5–11.0)

## 2019-10-17 LAB — COMPREHENSIVE METABOLIC PANEL
ALBUMIN: 3.7 g/dL (ref 3.5–5.0)
ALKALINE PHOSPHATASE: 64 U/L (ref 38–126)
ALT (SGPT): 22 U/L (ref <35)
ANION GAP: 7 mmol/L (ref 7–15)
AST (SGOT): 52 U/L — ABNORMAL HIGH (ref 14–38)
BILIRUBIN TOTAL: 2.5 mg/dL — ABNORMAL HIGH (ref 0.0–1.2)
BLOOD UREA NITROGEN: 37 mg/dL — ABNORMAL HIGH (ref 7–21)
BUN / CREAT RATIO: 33
CALCIUM: 8.9 mg/dL (ref 8.5–10.2)
CHLORIDE: 111 mmol/L — ABNORMAL HIGH (ref 98–107)
CO2: 23 mmol/L (ref 22.0–30.0)
CREATININE: 1.11 mg/dL — ABNORMAL HIGH (ref 0.60–1.00)
EGFR CKD-EPI AA FEMALE: 61 mL/min/{1.73_m2} (ref >=60)
EGFR CKD-EPI NON-AA FEMALE: 53 mL/min/{1.73_m2} — ABNORMAL LOW (ref >=60)
GLUCOSE RANDOM: 95 mg/dL (ref 70–179)
POTASSIUM: 3.2 mmol/L — ABNORMAL LOW (ref 3.5–5.0)
SODIUM: 141 mmol/L (ref 135–145)

## 2019-10-17 LAB — PHOSPHORUS: Phosphate:MCnc:Pt:Ser/Plas:Qn:: 3.3

## 2019-10-17 LAB — EOSINOPHILS ABSOLUTE COUNT: Eosinophils:NCnc:Pt:Bld:Qn:Automated count: 0

## 2019-10-17 LAB — MAGNESIUM: Magnesium:MCnc:Pt:Ser/Plas:Qn:: 2

## 2019-10-17 LAB — INR: Coagulation tissue factor induced.INR:RelTime:Pt:PPP:Qn:Coag: 1.79

## 2019-10-17 LAB — PROTIME-INR: PROTIME: 20.7 s — ABNORMAL HIGH (ref 10.5–13.5)

## 2019-10-17 LAB — ANION GAP: Anion gap 3:SCnc:Pt:Ser/Plas:Qn:: 7

## 2019-10-17 NOTE — Unmapped (Signed)
Assumed care. Vital signs stable and patient remained afebrile throughout the shift. Patient denies any signs or symptoms of pain. Patient medicated with scheduled medications-see mar for times. No PRN medications given this shift. Patients PORT site clean, dry and intact. Continues to run fluids at [facility]. Patient voiding without difficulty, passing liquid stools but has not cleared. No coloscopy today. Will restart golytely tonight. Patient ambulated with PT today and tolerated it well. Continues to tolerate a clear liquid diet. No one at bedside today, family called to check in. Patients blood sugars all remain within normal limits, no insulin coverage needed this shift. Patient sitting up in the chair at this time. All needs are met. Call bell within reach. Will continue to monitor.      Problem: Adult Inpatient Plan of Care  Goal: Plan of Care Review  Outcome: Progressing  Goal: Patient-Specific Goal (Individualization)  Outcome: Progressing  Goal: Absence of Hospital-Acquired Illness or Injury  Outcome: Progressing  Goal: Optimal Comfort and Wellbeing  Outcome: Progressing  Goal: Readiness for Transition of Care  Outcome: Progressing  Goal: Rounds/Family Conference  Outcome: Progressing     Problem: Fall Injury Risk  Goal: Absence of Fall and Fall-Related Injury  Outcome: Progressing     Problem: Self-Care Deficit  Goal: Improved Ability to Complete Activities of Daily Living  Outcome: Progressing     Problem: Skin Injury Risk Increased  Goal: Skin Health and Integrity  Outcome: Progressing     Problem: Diabetes Comorbidity  Goal: Blood Glucose Level Within Desired Range  Outcome: Progressing     Problem: Infection  Goal: Infection Symptom Resolution  Outcome: Progressing

## 2019-10-17 NOTE — Unmapped (Signed)
Pt is alert and oriented. No s/s of distress noted. Pt is free from falls at this time. Will continue to monitor.   Problem: Adult Inpatient Plan of Care  Goal: Plan of Care Review  Outcome: Progressing  Goal: Patient-Specific Goal (Individualization)  Outcome: Progressing  Goal: Absence of Hospital-Acquired Illness or Injury  Outcome: Progressing  Goal: Optimal Comfort and Wellbeing  Outcome: Progressing  Goal: Readiness for Transition of Care  Outcome: Progressing  Goal: Rounds/Family Conference  Outcome: Progressing

## 2019-10-17 NOTE — Unmapped (Signed)
HEPATOLOGY INPATIENT FOLLOW UP NOTE     Requesting Attending Physician:  Reece Packer, *    Reason for Consult:  [phone] w/hx of NAFLD cirrhosis (d/b ascites, HE, EV bleeding), s/p TIPS 06/2018 and embolization of mesocaval varices, w/subsequent worsening HE, also c/b GAVE requiring multiple episodes APC, who was admitted w/AMS and melena.     EGD with 1/19 w/PHG, duodenopathy and severe GAVE, treated with APC. TIPS ultrasound showed elevated velocities c/w mild stenosis but without significant step off. Course was c/b hyponatremia and AKI thought to be 2/2 dehydration/poor PO intake, which has now improved significantly.    Seen in consultation at the request of Dr. Ceasar Mons Elder Love, * for encephalopathy    Interval History:  -Yesterday bowel movements were not clear, and colonoscopy was planned for today.  Drink more GoLYTELY overnight.  Vital signs were stable overnight.  -Labs today are notable for a hemoglobin of 7.8, platelets 23 white count 1.1, creatinine down to 1.1, hypokalemia to 3.2, sodium is 141.  Meld sodium is 17.  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  Her last colonoscopy was in 2017 and showed multiple angiectasia's and she has not had this repeated.  She could have chronic oozing from transverse colon or right-sided angiectasia's and warrants colonoscopy for further evaluation, and to see if these lesions can be treated and reduce frequency of blood transfusions. This can be done as an outpatient.   -S/p colonoscopy as above.  - Labs in 1 week: CMP, CBC, INR - These have been ordered to [facility] by Dr. Ruffin Frederick RN Jearld Lesch and I spoke with the husband requesting that they get labs darwn at [facility] in 1 week and he verbalized understanding.     #Transplant Evaluation  Regarding her frailty, she was evaluated in 2019 with [facility] Frailty Index which was 4.59, consistent with significant frailty.  - We will schedule outpatient frailty evaluation in the next few weeks as an outpatient, as she is unlikely to score well while inpatient after her acute illness.    #Encephalopathy - Improving: Suspect HE in s/o dehydration/over-diuresis > infection (though CXR could be c/w PNA), though has gotten better with albumin/rehydration/holding diuretics.   Plan:  - Hypercalcemia has improved with hydration  - Infectious workup thus far negative aside from CXR w/?atlectasis vs infection. Though improved without ABx  -Continue lactulose 3 times daily, goal 4-5 bowel movements daily    #AKI - RESOLVED: Suspect pre-renal given response to albumin, holding diuretics. Differential also includes infection related-AKI, less likely HRS in the setting of TIPS and minimal ascites.   Plan  -Her home regimen includes 2 mg of Bumex twice a day, 100 mg of spironolactone daily, and during this hospitalization was briefly on some metolazone in addition to this. Can restart 2mg  bumex BID on discharge and spironolactone with NO metaolazone, no need to restart in s/o colonoscopy, can restart on discharge.  - Strict Is/Os  - Continue low Na diet    MELD-Na score: 17 at 10/17/2019  3:49 AM  MELD score: 17 at 10/17/2019  3:49 AM  Calculated from:  Serum Creatinine: 1.11 mg/dL at 0/96/0454  0:98 AM  Serum Sodium: 141 mmol/L (Rounded to 137 mmol/L) at 10/17/2019  3:49 AM  Total Bilirubin: 2.5 mg/dL at 9/60/4540  9:81 AM  INR(ratio): 1.79 at 10/17/2019  3:49 AM  Age: 64 years 5 months    Please page [phone] for further questions/concerns. Patient discussed with Dr. Arlyss Repress, MD  Gastroenterology Fellow, PGY-4  [facility]

## 2019-10-17 NOTE — Unmapped (Signed)
Internal Medicine (MDW) Progress Note    Interval History:  - Patient continued her go-lytely prep yesterday and overnight. At 12am  Her stool still had not cleared. She was encouraged to continue with her prep.  - She states that she is very ready to go home and feels more confused and disoriented in the hospital. Desepite these complaints, she appears to be more clear in terms of mental status this morning that she has during this admission.   - Cr continuing to downtrend to 1.11. H/H has been stable. She will receive 1u platelets this morning in anticipation of her EGD    Assessment & Plan:   Marilyn French is a 64 y.o. female with history of NASH cirrhosis s/p TIPS c/b worsening HE, prior variceal bleed, GAVE, HTN, pHTN, anxiety, chronic microcytic anemia, DM, and recent OSH admission for melena who presents with ongoing melena and worsening encephalopathy c/f GI bleed. Now s/p EGD, colonoscopy today    Principal Problem:    Upper GI bleed  Active Problems:    NAFLD (nonalcoholic fatty liver disease)    Cirrhosis (CMS-HCC)    GAVE (gastric antral vascular ectasia)    Hypothyroid    Hepatic encephalopathy (CMS-HCC)    Iron deficiency anemia    Thrombocytopenia (CMS-HCC)  Resolved Problems:    * No resolved hospital problems. *    Hepatic Encephalopathy: While there was a period of improvement in mental status on 1/20 immediately post-operatively, her mental status has been worsening since 1/21. She has continued lethargy and is very slow to answer questions. Initial oncern for possible infection but infectious work-up negative to date (blood and urine, no ascites present). Major depression might be a significant contributor to her slowed speech and poor functional status. Additionally, her liver disease may frankly be worsening as suggested by her current MELD.   - Hepatology following, appreciate recs.   - Continue lactulose to 40 mg QID - titrate to 3-5 BMs daily.  - Continue Rifaximin.     Melena - Microcytic Anemia: Ongoing problem for her in the past but reports worsening melena that prompted presentation to OSH. Initial hemoglobin there was 5.8 on 1/11 and she ultimately req'd 4U RBCs over the course of a 5 day admission. Still reports ongoing melena. Hemoglobin now stable at 8.5, up from 7.7 as early as morning of [place] presentation (albeit on different equipment). No evidence for brisk bleed and low concern for variceal issue given TIPS and clinical stability. Most likely related to GAVE or other upper GI etiology. Hemoglobin slowly decreasing over the course of the last 24 hours. 7.1 this morning. D/w hepatology who are recommending colonoscopy tomorrow, 1/27, given patient's history of AVMs. They are hopeful that if there are any AVMs that they can APC, this could help to avoid such frequent need for transfusions in the future. Patient had COVID swab yesterday prior to colonoscopy which was positive. However, she had a documented positive test on 08/26/2020. Infection prevention was consulted who changed patient's status to resolved infection.    - Planning for colonoscopy today.    - See hepatology recs as above  - Transfuse to maintain Hgb >7.   - Continue to monitor with CBC daily.  - Continue PO PPI BID.     AKI - Electrolyte derangements, improving: Cr improved to 1.24 this morning. Baseline ~1 on 1/23. Vitamin D and ionized calcium wnl. Thyroid studies wnl. PTN wnl. Calcium stable.  - Holding albumin today given albumin of 4.1.  -  Holding diuresis.     Anxiety/Depression: Pt endorses major sadness and depressive symptoms in regards to her poor condition and low candidacy for transplant. Offered to have palliative care team to come by to discuss goals of care going forward should she need to be hospitalized again in the future. Patient is quite resistant to this and states that she really would rather not have that conversation during this admission.  - Continue home Buspar.   - Continue zoloft 100mg  daily. NASH Cirrhosis s/p TIPS: Meld actually lower today at 45 and most recent clinic visit with hepatology in October last year. Has been turned down twice for LFT. Follows with Dr. Raford Pitcher. Overall appears well compensated other than encephalopathy and with volume status actually much improved per patient and discharge summary from OSH; down 12 lbs since presentation on 1/11 with IV diuresis and was transitioned to home bumex 2 mg BID in addition to new-start metolazone 5 mg daily. Reach out to hepatology in the morning. Patient having multiple BMs since yesterday. No obvious hematochezia or melena noted.   - Holding diuretics: bumex 2 mg BID, aldactone 100 mg daily, metolazone 5 mg daily.  ??  MELD-Na score: 17 at 10/17/2019  3:49 AM  MELD score: 17 at 10/17/2019  3:49 AM  Calculated from:  Serum Creatinine: 1.11 mg/dL at 1/61/0960  4:54 AM  Serum Sodium: 141 mmol/L (Rounded to 137 mmol/L) at 10/17/2019  3:49 AM  Total Bilirubin: 2.5 mg/dL at 0/98/1191  4:78 AM  INR(ratio): 1.79 at 10/17/2019  3:49 AM  Age: 53 years 5 months    DM: BG have been better controlled ranging 80-160s.   - Continue lantus 55u and SSI.   ??  Hypothyroidism: Continue home synthroid.   HLD: Continued formulary equivalent of home statin.   ??  Code status: Confirmed full code on arrival and with husband as healthcare proxy.   ??  Daily Checklist:  Diet: Regular Diet  DVT PPx: SCDs   GI PPx: PPI  Dispo: Admit to MDW, floor    Objective:   Temp:  [36.4 ??C-36.7 ??C] 36.7 ??C  Heart Rate:  [65-69] 69  SpO2 Pulse:  [61-64] 61  Resp:  [18-20] 20  BP: (97-116)/(43-90) 97/63  SpO2:  [88 %-99 %] 88 %    Gen: Chronically ill-appearing, appears older than stated age. Slow to respond to questions.   Heart: RRR. Normal S1 and S2. No appreciable murmurs.   Lungs: CTAB, no increased WOB.   Abdomen: Normoactive bowel sounds, soft, NTND, no rebound/guarding. No appreciable fluid wave on exam.    Extremities: 1+ pitting edema of BL LE. No asterixis on exam.   Neuro: A&o x3.     Labs/Studies: Labs and Studies from the last 24hrs per EMR and Reviewed

## 2019-10-18 LAB — CBC W/ AUTO DIFF
BASOPHILS ABSOLUTE COUNT: 0 10*9/L (ref 0.0–0.1)
BASOPHILS RELATIVE PERCENT: 0.8 %
EOSINOPHILS RELATIVE PERCENT: 2.6 %
HEMATOCRIT: 21.5 % — ABNORMAL LOW (ref 36.0–46.0)
HEMOGLOBIN: 7.1 g/dL — ABNORMAL LOW (ref 12.0–16.0)
LARGE UNSTAINED CELLS: 3 % (ref 0–4)
LYMPHOCYTES ABSOLUTE COUNT: 0.4 10*9/L — ABNORMAL LOW (ref 1.5–5.0)
MEAN CORPUSCULAR HEMOGLOBIN CONC: 33.1 g/dL (ref 31.0–37.0)
MEAN CORPUSCULAR HEMOGLOBIN: 29 pg (ref 26.0–34.0)
MEAN CORPUSCULAR VOLUME: 87.8 fL (ref 80.0–100.0)
MEAN PLATELET VOLUME: 13.8 fL — ABNORMAL HIGH (ref 7.0–10.0)
MONOCYTES ABSOLUTE COUNT: 0.2 10*9/L (ref 0.2–0.8)
MONOCYTES RELATIVE PERCENT: 12.2 %
NEUTROPHILS ABSOLUTE COUNT: 0.7 10*9/L — ABNORMAL LOW (ref 2.0–7.5)
NEUTROPHILS RELATIVE PERCENT: 53.3 %
PLATELET COUNT: 37 10*9/L — ABNORMAL LOW (ref 150–440)
RED CELL DISTRIBUTION WIDTH: 22.3 % — ABNORMAL HIGH (ref 12.0–15.0)
WBC ADJUSTED: 1.2 10*9/L — ABNORMAL LOW (ref 4.5–11.0)

## 2019-10-18 LAB — PHOSPHORUS: Phosphate:MCnc:Pt:Ser/Plas:Qn:: 3.6

## 2019-10-18 LAB — COMPREHENSIVE METABOLIC PANEL
ALKALINE PHOSPHATASE: 59 U/L (ref 38–126)
ALT (SGPT): 22 U/L (ref <35)
ANION GAP: 8 mmol/L (ref 7–15)
AST (SGOT): 49 U/L — ABNORMAL HIGH (ref 14–38)
BILIRUBIN TOTAL: 2.2 mg/dL — ABNORMAL HIGH (ref 0.0–1.2)
BLOOD UREA NITROGEN: 37 mg/dL — ABNORMAL HIGH (ref 7–21)
BUN / CREAT RATIO: 32
CALCIUM: 8.6 mg/dL (ref 8.5–10.2)
CHLORIDE: 113 mmol/L — ABNORMAL HIGH (ref 98–107)
CO2: 23 mmol/L (ref 22.0–30.0)
CREATININE: 1.15 mg/dL — ABNORMAL HIGH (ref 0.60–1.00)
EGFR CKD-EPI AA FEMALE: 58 mL/min/{1.73_m2} — ABNORMAL LOW (ref >=60)
EGFR CKD-EPI NON-AA FEMALE: 50 mL/min/{1.73_m2} — ABNORMAL LOW (ref >=60)
GLUCOSE RANDOM: 127 mg/dL (ref 70–179)
POTASSIUM: 3.7 mmol/L (ref 3.5–5.0)
PROTEIN TOTAL: 5.6 g/dL — ABNORMAL LOW (ref 6.5–8.3)

## 2019-10-18 LAB — ALT (SGPT): Alanine aminotransferase:CCnc:Pt:Ser/Plas:Qn:: 22

## 2019-10-18 LAB — HEMOGLOBIN AND HEMATOCRIT, BLOOD
HEMATOCRIT: 19.9 % — ABNORMAL LOW (ref 36.0–46.0)
HEMOGLOBIN: 7.6 g/dL — ABNORMAL LOW (ref 12.0–16.0)

## 2019-10-18 LAB — MAGNESIUM: Magnesium:MCnc:Pt:Ser/Plas:Qn:: 2

## 2019-10-18 LAB — INR: Coagulation tissue factor induced.INR:RelTime:Pt:PPP:Qn:Coag: 1.75

## 2019-10-18 LAB — LYMPHOCYTES RELATIVE PERCENT: Lymphocytes/100 leukocytes:NFr:Pt:Bld:Qn:Automated count: 28.4

## 2019-10-18 LAB — PROTIME-INR: PROTIME: 20.3 s — ABNORMAL HIGH (ref 10.5–13.5)

## 2019-10-18 LAB — HEMATOCRIT: Hematocrit:VFr:Pt:Bld:Qn:: 24.4 — ABNORMAL LOW

## 2019-10-18 LAB — HEMOGLOBIN: Hemoglobin:MCnc:Pt:Bld:Qn:: 6.1 — ABNORMAL LOW

## 2019-10-18 NOTE — Unmapped (Addendum)
Patient needs 1 week labs sent to [facility] - spoke with husband and instructed him to get labs drawn in 1 week. Will route to Drue Dun to order CMP, INR and CBC to [facility].

## 2019-10-18 NOTE — Unmapped (Addendum)
Patient awake & responsive, reported still having left eye pain, cold compress applied & pt finally went to sleep. VS had been stable. No other complaints noted. Will monitor.  Problem: Diabetes Comorbidity  Goal: Blood Glucose Level Within Desired Range  Outcome: Ongoing - Unchanged     Problem: Adult Inpatient Plan of Care  Goal: Plan of Care Review  Outcome: Progressing  Goal: Patient-Specific Goal (Individualization)  Outcome: Progressing  Goal: Absence of Hospital-Acquired Illness or Injury  Outcome: Progressing  Goal: Optimal Comfort and Wellbeing  Outcome: Progressing  Goal: Readiness for Transition of Care  Outcome: Progressing  Goal: Rounds/Family Conference  Outcome: Progressing     Problem: Fall Injury Risk  Goal: Absence of Fall and Fall-Related Injury  Outcome: Progressing     Problem: Self-Care Deficit  Goal: Improved Ability to Complete Activities of Daily Living  Outcome: Progressing     Problem: Skin Injury Risk Increased  Goal: Skin Health and Integrity  Outcome: Progressing     Problem: Infection  Goal: Infection Symptom Resolution  Outcome: Progressing

## 2019-10-18 NOTE — Unmapped (Signed)
Home Health Agency  [facility]  Ph:  [phone]  Fax:  [phone]  Services to be provided:  Skilled nursing, PT/OT, home health aide, and medical SW  Start of care:  10/20/2019  Agency will call prior to coming out for visit.

## 2019-10-18 NOTE — Unmapped (Addendum)
Marilyn French resting in bed most of shift, AO x 4. Stools still yellowish with fecal matter from previous prep. H+H collected by RN via port, which is flushing well and has good blood return. HgB is 6.1, so will infuse 1 unit PRBC and will need to stay at the hospital instead of discharging home.  Soft Bps per report and thi shift. BS 116/216 so far this shift. Back on regular diet.  No falls or injuries this shift.  Eye pain seems to have subsided this shift, but still needed help this AM ordering as she could not see menu well.     Type and screen has expired, so this was also collected. Will soon be transfusing RBCs.       Problem: Adult Inpatient Plan of Care  Goal: Plan of Care Review  10/18/2019 1556 by Amedeo Plenty, RN  Outcome: Progressing  10/18/2019 1556 by Amedeo Plenty, RN  Outcome: Progressing  Goal: Patient-Specific Goal (Individualization)  10/18/2019 1556 by Amedeo Plenty, RN  Outcome: Progressing  10/18/2019 1556 by Amedeo Plenty, RN  Outcome: Progressing  Goal: Absence of Hospital-Acquired Illness or Injury  10/18/2019 1556 by Amedeo Plenty, RN  Outcome: Progressing  10/18/2019 1556 by Amedeo Plenty, RN  Outcome: Progressing  Goal: Optimal Comfort and Wellbeing  10/18/2019 1556 by Amedeo Plenty, RN  Outcome: Progressing  10/18/2019 1556 by Amedeo Plenty, RN  Outcome: Progressing  Goal: Readiness for Transition of Care  10/18/2019 1556 by Amedeo Plenty, RN  Outcome: Progressing  10/18/2019 1556 by Amedeo Plenty, RN  Outcome: Progressing  Goal: Rounds/Family Conference  10/18/2019 1556 by Amedeo Plenty, RN  Outcome: Progressing  10/18/2019 1556 by Amedeo Plenty, RN  Outcome: Progressing     Problem: Fall Injury Risk  Goal: Absence of Fall and Fall-Related Injury  10/18/2019 1556 by Amedeo Plenty, RN  Outcome: Progressing  10/18/2019 1556 by Amedeo Plenty, RN  Outcome: Progressing     Problem: Self-Care Deficit  Goal: Improved Ability to Complete Activities of Daily Living  10/18/2019 1556 by Amedeo Plenty, RN  Outcome: Progressing  10/18/2019 1556 by Amedeo Plenty, RN  Outcome: Progressing     Problem: Skin Injury Risk Increased  Goal: Skin Health and Integrity  10/18/2019 1556 by Amedeo Plenty, RN  Outcome: Progressing  10/18/2019 1556 by Amedeo Plenty, RN  Outcome: Progressing     Problem: Diabetes Comorbidity  Goal: Blood Glucose Level Within Desired Range  10/18/2019 1556 by Amedeo Plenty, RN  Outcome: Progressing  10/18/2019 1556 by Amedeo Plenty, RN  Outcome: Progressing     Problem: Infection  Goal: Infection Symptom Resolution  10/18/2019 1556 by Amedeo Plenty, RN  Outcome: Progressing  10/18/2019 1556 by Amedeo Plenty, RN  Outcome: Progressing

## 2019-10-18 NOTE — Unmapped (Signed)
Marilyn French was afebrile, VSS this shift. Continued on her golytely this morning, never fully cleared her stool-it remained brown but was transparent-and went for her colonoscopy. Prior to her procedure, she was given one unit of platelets and tolerated well with no pre-meds. Sugars were on the lower end while NPO and awaiting procedure so per MD, okay to hold lantus. Upon return from her procedure, eating well, drinking well. Began complaining of burning left eye pain and persistent tearing of that eye. It progressed to feeling like she cannot see and cannot open the eye. MD paged several times, tried warm compresses and saline flush with no improvement. Still awaiting MD to come assess. Her daughter was updated via phone this afternoon. Will continue to monitor and follow POC.    Problem: Adult Inpatient Plan of Care  Goal: Plan of Care Review  Outcome: Progressing  Goal: Patient-Specific Goal (Individualization)  Outcome: Progressing  Goal: Absence of Hospital-Acquired Illness or Injury  Outcome: Progressing  Goal: Optimal Comfort and Wellbeing  Outcome: Progressing  Goal: Readiness for Transition of Care  Outcome: Progressing  Goal: Rounds/Family Conference  Outcome: Progressing     Problem: Fall Injury Risk  Goal: Absence of Fall and Fall-Related Injury  Outcome: Progressing     Problem: Self-Care Deficit  Goal: Improved Ability to Complete Activities of Daily Living  Outcome: Progressing     Problem: Skin Injury Risk Increased  Goal: Skin Health and Integrity  Outcome: Progressing     Problem: Diabetes Comorbidity  Goal: Blood Glucose Level Within Desired Range  Outcome: Progressing     Problem: Infection  Goal: Infection Symptom Resolution  Outcome: Progressing

## 2019-10-18 NOTE — Unmapped (Signed)
Internal Medicine (MDW) Progress Note    Interval History:  - Patient is s/p c scope yesterday. Has been feeling ok since.   - Hgb is 7.1 this morning and repeat Hgb at noon down trended to 6.1. Patient denying any bloody stools overnight or any new/worsening abdominal pain    Assessment & Plan:   Marilyn French is a 64 y.o. female with history of NASH cirrhosis s/p TIPS c/b worsening HE, prior variceal bleed, GAVE, HTN, pHTN, anxiety, chronic microcytic anemia, DM, and recent OSH admission for melena who presents with ongoing melena and worsening encephalopathy c/f GI bleed. Now s/p EGD, colonoscopy today    Principal Problem:    Upper GI bleed  Active Problems:    NAFLD (nonalcoholic fatty liver disease)    Cirrhosis (CMS-HCC)    GAVE (gastric antral vascular ectasia)    Hypothyroid    Hepatic encephalopathy (CMS-HCC)    Iron deficiency anemia    Thrombocytopenia (CMS-HCC)  Resolved Problems:    * No resolved hospital problems. *    Melena - Microcytic Anemia: Ongoing problem for her in the past but reports worsening melena that prompted presentation to OSH. Initial hemoglobin there was 5.8 on 1/11 and she ultimately req'd 4U RBCs over the course of a 5 day admission. She is s/p EGD on 1/19, APC to GAVE at that time. Hemoglobin was still lower than her baseline and it was decided that she would benefit from a C scope given known history of colonic AVMs as seen on 2017 scope. She underwent c scope 1/29 where three with APC to three colonic AVMs. On 1/30, hgb dropped from 7.1 to 6.1 with no obvious signs of bleeding, most likely from post-procedural oozing.   - Hepatology aware. Appreciate assistance   - 1u pRBCs ordered   - Recheck [place] H&H  - Transfuse to maintain Hgb >7.   - Continue to monitor with CBC daily.  - Continue PO PPI BID.     Hepatic Encephalopathy: While there was a period of improvement in mental status on 1/20 immediately post-operatively, her mental status has been worsening since 1/21. She has continued lethargy and is very slow to answer questions. Initial oncern for possible infection but infectious work-up negative to date (blood and urine, no ascites present). Major depression might be a significant contributor to her slowed speech and poor functional status. Additionally, her liver disease may frankly be worsening as suggested by her current MELD.   - Hepatology following, appreciate recs.   - Continue lactulose to 40 mg QID - titrate to 3-5 BMs daily.  - Continue Rifaximin.     AKI - Electrolyte derangements, improving: Cr continues to improve to 1.15 this morning. Baseline ~1 on 1/23. Vitamin D and ionized calcium wnl. Thyroid studies wnl. PTN wnl. Calcium stable.  - Holding albumin today given albumin of 4.1.  - Holding diuresis.     Anxiety/Depression: Pt endorses major sadness and depressive symptoms in regards to her poor condition and low candidacy for transplant. Offered to have palliative care team to come by to discuss goals of care going forward should she need to be hospitalized again in the future. Patient is quite resistant to this and states that she really would rather not have that conversation during this admission.  - Continue home Buspar.   - Continue zoloft 100mg  daily.     NASH Cirrhosis s/p TIPS: Meld actually lower today at 32 and most recent clinic visit with hepatology in October last year.  Has been turned down twice for LFT. Follows with Dr. Raford Pitcher. Overall appears well compensated other than encephalopathy and with volume status actually much improved per patient and discharge summary from OSH; down 12 lbs since presentation on 1/11 with IV diuresis and was transitioned to home bumex 2 mg BID in addition to new-start metolazone 5 mg daily. Reach out to hepatology in the morning. Patient having multiple BMs since yesterday. No obvious hematochezia or melena noted.   - Holding diuretics: bumex 2 mg BID, aldactone 100 mg daily, metolazone 5 mg daily.  ??  MELD-Na score: 18 at 10/18/2019  5:38 AM  MELD score: 18 at 10/18/2019  5:38 AM  Calculated from:  Serum Creatinine: 1.15 mg/dL at 2/84/1324  4:01 AM  Serum Sodium: 144 mmol/L (Rounded to 137 mmol/L) at 10/18/2019  5:38 AM  Total Bilirubin: 2.2 mg/dL at 0/27/2536  6:44 AM  INR(ratio): 1.75 at 10/18/2019  5:38 AM  Age: 58 years 5 months    DM: BG have been better controlled ranging 80-160s.   - Continue lantus 55u and SSI.   ??  Hypothyroidism: Continue home synthroid.   HLD: Continued formulary equivalent of home statin.   ??  Code status: Confirmed full code on arrival and with husband as healthcare proxy.   ??  Daily Checklist:  Diet: Regular Diet  DVT PPx: SCDs   GI PPx: PPI  Dispo: Admit to MDW, floor    Objective:   Temp:  [36.6 ??C-36.8 ??C] 36.6 ??C  Heart Rate:  [62-69] 67  SpO2 Pulse:  [62-64] 62  Resp:  [15-18] 18  BP: (107-121)/(36-61) 120/36  SpO2:  [94 %-100 %] 100 %    Gen: Chronically ill-appearing, appears older than stated age. Slow to respond to questions.   Heart: RRR. Normal S1 and S2. No appreciable murmurs.   Lungs: CTAB, no increased WOB.   Abdomen: Normoactive bowel sounds, soft, NTND, no rebound/guarding. No appreciable fluid wave on exam.    Extremities: 1+ pitting edema of BL LE. No asterixis on exam.   Neuro: A&O x3.     Labs/Studies: Labs and Studies from the last 24hrs per EMR and Reviewed

## 2019-10-19 LAB — CBC W/ AUTO DIFF
BASOPHILS ABSOLUTE COUNT: 0 10*9/L (ref 0.0–0.1)
BASOPHILS RELATIVE PERCENT: 0.8 %
EOSINOPHILS ABSOLUTE COUNT: 0 10*9/L (ref 0.0–0.4)
EOSINOPHILS RELATIVE PERCENT: 2.3 %
HEMATOCRIT: 23.1 % — ABNORMAL LOW (ref 36.0–46.0)
HEMOGLOBIN: 7.3 g/dL — ABNORMAL LOW (ref 12.0–16.0)
LARGE UNSTAINED CELLS: 5 % — ABNORMAL HIGH (ref 0–4)
LYMPHOCYTES ABSOLUTE COUNT: 0.3 10*9/L — ABNORMAL LOW (ref 1.5–5.0)
LYMPHOCYTES RELATIVE PERCENT: 24.5 %
MEAN CORPUSCULAR HEMOGLOBIN CONC: 31.5 g/dL (ref 31.0–37.0)
MEAN CORPUSCULAR HEMOGLOBIN: 28.2 pg (ref 26.0–34.0)
MEAN CORPUSCULAR VOLUME: 89.4 fL (ref 80.0–100.0)
MONOCYTES ABSOLUTE COUNT: 0.1 10*9/L — ABNORMAL LOW (ref 0.2–0.8)
NEUTROPHILS ABSOLUTE COUNT: 0.7 10*9/L — ABNORMAL LOW (ref 2.0–7.5)
NEUTROPHILS RELATIVE PERCENT: 58.6 %
PLATELET COUNT: 26 10*9/L — ABNORMAL LOW (ref 150–440)
RED BLOOD CELL COUNT: 2.59 10*12/L — ABNORMAL LOW (ref 4.00–5.20)
RED CELL DISTRIBUTION WIDTH: 20.9 % — ABNORMAL HIGH (ref 12.0–15.0)
WBC ADJUSTED: 1.1 10*9/L — ABNORMAL LOW (ref 4.5–11.0)

## 2019-10-19 LAB — PROTIME-INR: INR: 1.96

## 2019-10-19 LAB — COMPREHENSIVE METABOLIC PANEL
ALBUMIN: 3.2 g/dL — ABNORMAL LOW (ref 3.5–5.0)
ALKALINE PHOSPHATASE: 59 U/L (ref 38–126)
ALT (SGPT): 18 U/L (ref <35)
ANION GAP: 4 mmol/L — ABNORMAL LOW (ref 7–15)
AST (SGOT): 39 U/L — ABNORMAL HIGH (ref 14–38)
BILIRUBIN TOTAL: 2.1 mg/dL — ABNORMAL HIGH (ref 0.0–1.2)
BUN / CREAT RATIO: 26
CALCIUM: 8.8 mg/dL (ref 8.5–10.2)
CHLORIDE: 113 mmol/L — ABNORMAL HIGH (ref 98–107)
CREATININE: 1.23 mg/dL — ABNORMAL HIGH (ref 0.60–1.00)
EGFR CKD-EPI AA FEMALE: 54 mL/min/{1.73_m2} — ABNORMAL LOW (ref >=60)
EGFR CKD-EPI NON-AA FEMALE: 46 mL/min/{1.73_m2} — ABNORMAL LOW (ref >=60)
GLUCOSE RANDOM: 156 mg/dL (ref 70–179)
POTASSIUM: 3.4 mmol/L — ABNORMAL LOW (ref 3.5–5.0)
PROTEIN TOTAL: 5.3 g/dL — ABNORMAL LOW (ref 6.5–8.3)
SODIUM: 140 mmol/L (ref 135–145)

## 2019-10-19 LAB — PHOSPHORUS
PHOSPHORUS: 3 mg/dL (ref 2.9–4.7)
Phosphate:MCnc:Pt:Ser/Plas:Qn:: 3

## 2019-10-19 LAB — PROTIME: Coagulation tissue factor induced:Time:Pt:PPP:Qn:Coag: 22.6 — ABNORMAL HIGH

## 2019-10-19 LAB — HEMATOCRIT
Hematocrit:VFr:Pt:Bld:Qn:: 19.5 — ABNORMAL LOW
Hematocrit:VFr:Pt:Bld:Qn:: 25.2 — ABNORMAL LOW

## 2019-10-19 LAB — EGFR CKD-EPI AA FEMALE
Glomerular filtration rate/1.73 sq M.predicted.black:ArVRat:Pt:Ser/Plas/Bld:Qn:Creatinine-based formula (CKD-EPI): 54 — ABNORMAL LOW

## 2019-10-19 LAB — HEMOGLOBIN AND HEMATOCRIT, BLOOD
HEMATOCRIT: 19.5 % — ABNORMAL LOW (ref 36.0–46.0)
HEMOGLOBIN: 8.3 g/dL — ABNORMAL LOW (ref 12.0–16.0)

## 2019-10-19 LAB — LYMPHOCYTES RELATIVE PERCENT: Lymphocytes/100 leukocytes:NFr:Pt:Bld:Qn:Automated count: 24.5

## 2019-10-19 LAB — MAGNESIUM: Magnesium:MCnc:Pt:Ser/Plas:Qn:: 2

## 2019-10-19 NOTE — Unmapped (Signed)
Spiritual Care Contact  October 18, 2019 5:10 PM     I attempted to make contact with Marilyn French, but the patient was unavailable for a chaplain visit.   Follow up: Will follow-up for another visit before end of shift.     Signed: Melbourne Abts, Chaplain 5:10 PM 10/18/2019

## 2019-10-19 NOTE — Unmapped (Signed)
Internal Medicine (MDW) Progress Note    Interval History:  - nursing reports dark brown to black stool, Hgb this AM overall stable to 7.3 from 7.6  - she feels nervous about the blood count before going home    Assessment & Plan:   Marilyn French is a 64 y.o. female with history of NASH cirrhosis s/p TIPS c/b worsening HE, prior variceal bleed, GAVE, HTN, pHTN, anxiety, chronic microcytic anemia, DM, and recent OSH admission for melena who presents with ongoing melena and worsening encephalopathy c/f GI bleed. Now s/p EGD with persistent GAVE and colonoscopy w/ AVMs this admission.     Principal Problem:    Upper GI bleed  Active Problems:    NAFLD (nonalcoholic fatty liver disease)    Cirrhosis (CMS-HCC)    GAVE (gastric antral vascular ectasia)    Hypothyroid    Hepatic encephalopathy (CMS-HCC)    Iron deficiency anemia    Thrombocytopenia (CMS-HCC)  Resolved Problems:    * No resolved hospital problems. *    Melena - Microcytic Anemia: Ongoing problem for her in the past but reports worsening melena that prompted presentation to OSH. Initial hemoglobin there was 5.8 on 1/11 and she ultimately req'd 4U RBCs over the course of a 5 day admission. She is s/p EGD on 1/19, APC to GAVE at that time. Hemoglobin was still lower than her baseline and it was decided that she would benefit from a C scope given known history of colonic AVMs as seen on 2017 scope. She underwent c scope 1/29 where three with APC to three colonic AVMs. On 1/30, hgb dropped from 7.1 to 6.1 with no obvious signs of bleeding, most likely from post-procedural oozing. On 1/31, some black stool overnight concerning for upper GI source, may be recurrent from known GAVE. Needs continued Hgb monitoring.  - Hepatology aware. Appreciate assistance    - Recheck H/H this afternoon  - If Hgb stable today and tomorrow, may consider discharge tomorrow. If not, may need to discuss with GI for repeat endoscopy vs capsule study.  - Transfuse to maintain Hgb >7. - Continue to monitor with CBC daily.  - Continue PO PPI BID.     Hepatic Encephalopathy: While there was a period of improvement in mental status on 1/20 immediately post-operatively, her mental status has been worsening since 1/21. She has continued lethargy and is very slow to answer questions. Initial oncern for possible infection but infectious work-up negative to date (blood and urine, no ascites present). Major depression might be a significant contributor to her slowed speech and poor functional status.   - Hepatology following, appreciate recs.   - Continue lactulose to 40 mg QID - titrate to 3-5 BMs daily.  - Continue Rifaximin.   - Pt not interested in speaking with palliative regarding her psychosocial needs    AKI - Electrolyte derangements, improving: Cr continues to improve to 1.15 this morning. Baseline ~1 on 1/23. Vitamin D and ionized calcium wnl. Thyroid studies wnl. PTN wnl. Calcium stable.  - Holding albumin today given albumin of 4.1.  - Holding diuresis. Resume when stable from bleeding standpoint.     Anxiety/Depression: Pt endorses major sadness and depressive symptoms in regards to her poor condition and low candidacy for transplant. Offered to have palliative care team to come by to discuss goals of care going forward should she need to be hospitalized again in the future. Patient is quite resistant to this and states that she really would rather not have  that conversation during this admission.  - Continue home Buspar.   - Continue zoloft 100mg  daily.     NASH Cirrhosis s/p TIPS: Meld actually lower today at 71 and most recent clinic visit with hepatology in October last year. Has been turned down twice for LFT. Follows with Dr. Raford Pitcher. Overall appears well compensated other than encephalopathy and with volume status actually much improved per patient and discharge summary from OSH; down 12 lbs since presentation on 1/11 with IV diuresis and was transitioned to home bumex 2 mg BID in addition to new-start metolazone 5 mg daily. Reach out to hepatology in the morning. Patient having multiple BMs since yesterday. No obvious hematochezia or melena noted.   - Holding diuretics: bumex 2 mg BID, aldactone 100 mg daily, metolazone 5 mg daily.  ??  MELD-Na score: 19 at 10/19/2019  4:08 AM  MELD score: 19 at 10/19/2019  4:08 AM  Calculated from:  Serum Creatinine: 1.23 mg/dL at 0/27/2536  6:44 AM  Serum Sodium: 140 mmol/L (Rounded to 137 mmol/L) at 10/19/2019  4:08 AM  Total Bilirubin: 2.1 mg/dL at 0/34/7425  9:56 AM  INR(ratio): 1.96 at 10/19/2019  4:08 AM  Age: 73 years 5 months    DM: BG have been better controlled ranging 80-160s.   - Continue lantus 50u and SSI.   ??  Hypothyroidism: Continue home synthroid.   HLD: Continued formulary equivalent of home statin.   ??  Code status: Confirmed full code on arrival and with husband as healthcare proxy.   ??  Daily Checklist:  Diet: Regular Diet  DVT PPx: SCDs   GI PPx: PPI  Dispo: Admit to MDW, floor    Hinda Lenis, MD  Medicine-Pediatrics, PGY-3  Pager [phone]    Objective:   Temp:  [36.3 ??C-36.9 ??C] 36.5 ??C  Heart Rate:  [67-75] 70  SpO2 Pulse:  [72] 72  Resp:  [17-18] 18  BP: (114-129)/(37-95) 120/42  SpO2:  [95 %-100 %] 97 %    Gen: Chronically ill-appearing, appears older than stated age. Slow to respond to questions.   Heart: RRR. Normal S1 and S2. No appreciable murmurs.   Lungs: CTAB, no increased WOB.   Abdomen: Normoactive bowel sounds, soft, NTND, no rebound/guarding. No appreciable fluid wave on exam.    Extremities: No peripheral edema, warm, well-perfused. No asterixis on exam.   Neuro: A&O x3.     Labs/Studies: Labs and Studies from the last 24hrs per EMR and Reviewed

## 2019-10-19 NOTE — Unmapped (Signed)
BP on soft side asymptomatic, 2x loose dark brown to black stool. FSBS within normal limits. 1 unit of PRBC infused last night. Needs attended will monitor.  Problem: Adult Inpatient Plan of Care  Goal: Plan of Care Review  Outcome: Progressing  Goal: Patient-Specific Goal (Individualization)  Outcome: Progressing  Goal: Absence of Hospital-Acquired Illness or Injury  Outcome: Progressing  Goal: Optimal Comfort and Wellbeing  Outcome: Progressing  Goal: Readiness for Transition of Care  Outcome: Progressing  Goal: Rounds/Family Conference  Outcome: Progressing     Problem: Fall Injury Risk  Goal: Absence of Fall and Fall-Related Injury  Outcome: Progressing     Problem: Self-Care Deficit  Goal: Improved Ability to Complete Activities of Daily Living  Outcome: Progressing     Problem: Skin Injury Risk Increased  Goal: Skin Health and Integrity  Outcome: Progressing     Problem: Diabetes Comorbidity  Goal: Blood Glucose Level Within Desired Range  Outcome: Progressing     Problem: Infection  Goal: Infection Symptom Resolution  Outcome: Progressing

## 2019-10-20 ENCOUNTER — Encounter (HOSPITAL_COMMUNITY): Admission: RE | Admit: 2019-10-20 | Payer: PPO | Source: Ambulatory Visit

## 2019-10-20 DIAGNOSIS — K7460 Unspecified cirrhosis of liver: Principal | ICD-10-CM

## 2019-10-20 DIAGNOSIS — K31819 Angiodysplasia of stomach and duodenum without bleeding: Principal | ICD-10-CM

## 2019-10-20 LAB — CBC W/ AUTO DIFF
BASOPHILS RELATIVE PERCENT: 1.1 %
EOSINOPHILS ABSOLUTE COUNT: 0 10*9/L (ref 0.0–0.4)
EOSINOPHILS RELATIVE PERCENT: 2.3 %
HEMATOCRIT: 25.1 % — ABNORMAL LOW (ref 36.0–46.0)
HEMOGLOBIN: 8.3 g/dL — ABNORMAL LOW (ref 12.0–16.0)
LYMPHOCYTES ABSOLUTE COUNT: 0.3 10*9/L — ABNORMAL LOW (ref 1.5–5.0)
MEAN CORPUSCULAR HEMOGLOBIN CONC: 33 g/dL (ref 31.0–37.0)
MEAN CORPUSCULAR HEMOGLOBIN: 29.6 pg (ref 26.0–34.0)
MEAN CORPUSCULAR VOLUME: 89.7 fL (ref 80.0–100.0)
MEAN PLATELET VOLUME: 13.6 fL — ABNORMAL HIGH (ref 7.0–10.0)
MONOCYTES ABSOLUTE COUNT: 0.1 10*9/L — ABNORMAL LOW (ref 0.2–0.8)
MONOCYTES RELATIVE PERCENT: 9.1 %
NEUTROPHILS ABSOLUTE COUNT: 0.9 10*9/L — ABNORMAL LOW (ref 2.0–7.5)
NEUTROPHILS RELATIVE PERCENT: 64.1 %
PLATELET COUNT: 34 10*9/L — ABNORMAL LOW (ref 150–440)
RED BLOOD CELL COUNT: 2.79 10*12/L — ABNORMAL LOW (ref 4.00–5.20)
RED CELL DISTRIBUTION WIDTH: 21.3 % — ABNORMAL HIGH (ref 12.0–15.0)
WBC ADJUSTED: 1.5 10*9/L — ABNORMAL LOW (ref 4.5–11.0)

## 2019-10-20 LAB — COMPREHENSIVE METABOLIC PANEL
ALBUMIN: 3.3 g/dL — ABNORMAL LOW (ref 3.5–5.0)
ALKALINE PHOSPHATASE: 60 U/L (ref 38–126)
ALT (SGPT): 18 U/L (ref <35)
ANION GAP: 5 mmol/L — ABNORMAL LOW (ref 7–15)
AST (SGOT): 35 U/L (ref 14–38)
BILIRUBIN TOTAL: 2 mg/dL — ABNORMAL HIGH (ref 0.0–1.2)
BLOOD UREA NITROGEN: 25 mg/dL — ABNORMAL HIGH (ref 7–21)
BUN / CREAT RATIO: 26
CALCIUM: 8.6 mg/dL (ref 8.5–10.2)
CHLORIDE: 114 mmol/L — ABNORMAL HIGH (ref 98–107)
CO2: 21 mmol/L — ABNORMAL LOW (ref 22.0–30.0)
CREATININE: 0.95 mg/dL (ref 0.60–1.00)
EGFR CKD-EPI AA FEMALE: 73 mL/min/{1.73_m2} (ref >=60)
EGFR CKD-EPI NON-AA FEMALE: 63 mL/min/{1.73_m2} (ref >=60)
GLUCOSE RANDOM: 106 mg/dL (ref 70–179)
POTASSIUM: 3.6 mmol/L (ref 3.5–5.0)
SODIUM: 140 mmol/L (ref 135–145)

## 2019-10-20 LAB — TROPONIN I: Troponin I.cardiac:MCnc:Pt:Ser/Plas:Qn:: <0.034

## 2019-10-20 LAB — MAGNESIUM: Magnesium:MCnc:Pt:Ser/Plas:Qn:: 2.1

## 2019-10-20 LAB — PROTIME: Coagulation tissue factor induced:Time:Pt:PPP:Qn:Coag: 22.6 — ABNORMAL HIGH

## 2019-10-20 LAB — PHOSPHORUS: Phosphate:MCnc:Pt:Ser/Plas:Qn:: 3

## 2019-10-20 LAB — PROTIME-INR: PROTIME: 22.6 s — ABNORMAL HIGH (ref 10.5–13.5)

## 2019-10-20 LAB — RED CELL DISTRIBUTION WIDTH: Lab: 21.3 — ABNORMAL HIGH

## 2019-10-20 LAB — ALT (SGPT): Alanine aminotransferase:CCnc:Pt:Ser/Plas:Qn:: 18

## 2019-10-20 LAB — HEMOGLOBIN: Hemoglobin:MCnc:Pt:Bld:Qn:: 8.1 — ABNORMAL LOW

## 2019-10-20 NOTE — Unmapped (Signed)
Orders placed for labs at [facility].

## 2019-10-20 NOTE — Unmapped (Addendum)
Marilyn French resting in bed this shift, AO x 4. Able to get to bedside commode for stool today. Loose/watery/dark brown. H+H collected from port at 1400, with port flushing well with blood return.  HgB at 6.1, down from 7.3 this AM and 7.6 last night. Transfusing 1 unit PRBCs at time of note with no distress noted from patient during infusion. Bps soft but WNL. Regular diet. Blood sugars required no insulin in AM but 6 units for lunch. No falls or injuries this shift. MDs in to talk with patient about continued need for care and hospitalization.       Problem: Adult Inpatient Plan of Care  Goal: Plan of Care Review  Outcome: Progressing  Goal: Patient-Specific Goal (Individualization)  Outcome: Progressing  Goal: Absence of Hospital-Acquired Illness or Injury  Outcome: Progressing  Goal: Optimal Comfort and Wellbeing  Outcome: Progressing  Goal: Readiness for Transition of Care  Outcome: Progressing  Goal: Rounds/Family Conference  Outcome: Progressing     Problem: Fall Injury Risk  Goal: Absence of Fall and Fall-Related Injury  Outcome: Progressing     Problem: Self-Care Deficit  Goal: Improved Ability to Complete Activities of Daily Living  Outcome: Progressing     Problem: Skin Injury Risk Increased  Goal: Skin Health and Integrity  Outcome: Progressing     Problem: Diabetes Comorbidity  Goal: Blood Glucose Level Within Desired Range  Outcome: Progressing     Problem: Infection  Goal: Infection Symptom Resolution  Outcome: Progressing

## 2019-10-20 NOTE — Unmapped (Signed)
VSS and pt remains afebrile. Pt with no c/o pain. Pt tolerated diet with no N/V. Blood sugar 151 at bedtime and covered with sliding scale insulin. Pt voiding without issues and had one BM tonight. No blood noted in BM. No falls or injuries have occurred. H&H remains stable after one unit of blood on day shift.  Problem: Adult Inpatient Plan of Care  Goal: Plan of Care Review  Outcome: Progressing  Goal: Patient-Specific Goal (Individualization)  Outcome: Progressing  Goal: Absence of Hospital-Acquired Illness or Injury  Outcome: Progressing  Goal: Optimal Comfort and Wellbeing  Outcome: Progressing  Goal: Readiness for Transition of Care  Outcome: Progressing  Goal: Rounds/Family Conference  Outcome: Progressing     Problem: Fall Injury Risk  Goal: Absence of Fall and Fall-Related Injury  Outcome: Progressing     Problem: Self-Care Deficit  Goal: Improved Ability to Complete Activities of Daily Living  Outcome: Progressing     Problem: Skin Injury Risk Increased  Goal: Skin Health and Integrity  Outcome: Progressing     Problem: Diabetes Comorbidity  Goal: Blood Glucose Level Within Desired Range  Outcome: Progressing     Problem: Infection  Goal: Infection Symptom Resolution  Outcome: Progressing

## 2019-10-20 NOTE — Unmapped (Signed)
Internal Medicine (MDW) Progress Note    Interval History:  - Hemoglobin stable at 8.3 this morning.   - Patient with episode of mild chest pain overnight that self-resolved. EKG normal at that time and troponin negative.   - Patient still nervous about discharging home.     Assessment & Plan:   Marilyn French is a 64 y.o. female with history of NASH cirrhosis s/p TIPS c/b worsening HE, prior variceal bleed, GAVE, HTN, pHTN, anxiety, chronic microcytic anemia, DM, and recent OSH admission for melena who presents with ongoing melena and worsening encephalopathy c/f GI bleed. Now s/p EGD with persistent GAVE and colonoscopy w/ AVMs this admission.     Principal Problem:    Upper GI bleed  Active Problems:    NAFLD (nonalcoholic fatty liver disease)    Cirrhosis (CMS-HCC)    GAVE (gastric antral vascular ectasia)    Hypothyroid    Hepatic encephalopathy (CMS-HCC)    Iron deficiency anemia    Thrombocytopenia (CMS-HCC)  Resolved Problems:    * No resolved hospital problems. *    Melena - Microcytic Anemia: Ongoing problem for her in the past but reports worsening melena that prompted presentation to OSH. Initial hemoglobin there was 5.8 on 1/11 and she ultimately req'd 4U RBCs over the course of a 5 day admission. She is s/p EGD on 1/19, APC to GAVE at that time. Hemoglobin was still lower than her baseline and it was decided that she would benefit from a C scope given known history of colonic AVMs as seen on 2017 scope. She underwent c scope 1/29 where three with APC to three colonic AVMs. On 1/30, hgb dropped from 7.1 to 6.1 with no obvious signs of bleeding, most likely from post-procedural oozing. On 1/31, some black stool overnight concerning for upper GI source, may be recurrent from known GAVE. Needs continued Hgb monitoring.  - Hepatology aware. Appreciate assistance    - Recheck H/H this afternoon  - Transfuse to maintain Hgb >7.   - Continue to monitor with CBC daily.  - Continue PO PPI BID.     Hepatic Encephalopathy: While there was a period of improvement in mental status on 1/20 immediately post-operatively, her mental status has been worsening since 1/21. She has continued lethargy and is very slow to answer questions. Initial oncern for possible infection but infectious work-up negative to date (blood and urine, no ascites present). Major depression might be a significant contributor to her slowed speech and poor functional status.   - Hepatology following, appreciate recs.   - Continue lactulose to 40 mg QID - titrate to 3-5 BMs daily.  - Continue Rifaximin.   - Pt not interested in speaking with palliative regarding her psychosocial needs    AKI - Electrolyte derangements, improving: Cr continues to improve to 1.15 this morning. Baseline ~1 on 1/23. Vitamin D and ionized calcium wnl. Thyroid studies wnl. PTN wnl. Calcium stable.  - Holding albumin today given albumin of 4.1.  - Holding diuresis. Resume when stable from bleeding standpoint.     Anxiety/Depression: Pt endorses major sadness and depressive symptoms in regards to her poor condition and low candidacy for transplant. Offered to have palliative care team to come by to discuss goals of care going forward should she need to be hospitalized again in the future. Patient is quite resistant to this and states that she really would rather not have that conversation during this admission.  - Continue home Buspar.   - Continue zoloft  100mg  daily.     NASH Cirrhosis s/p TIPS: Meld actually lower today at 21 and most recent clinic visit with hepatology in October last year. Has been turned down twice for LFT. Follows with Dr. Raford Pitcher. Overall appears well compensated other than encephalopathy and with volume status actually much improved per patient and discharge summary from OSH; down 12 lbs since presentation on 1/11 with IV diuresis and was transitioned to home bumex 2 mg BID in addition to new-start metolazone 5 mg daily. Reach out to hepatology in the morning. Patient having multiple BMs since yesterday. No obvious hematochezia or melena noted.   - Holding diuretics: bumex 2 mg BID, aldactone 100 mg daily, metolazone 5 mg daily.  ??  MELD-Na score: 17 at 10/20/2019  3:36 AM  MELD score: 17 at 10/20/2019  3:36 AM  Calculated from:  Serum Creatinine: 0.95 mg/dL (Rounded to 1 mg/dL) at 09/23/1094  0:45 AM  Serum Sodium: 140 mmol/L (Rounded to 137 mmol/L) at 10/20/2019  3:36 AM  Total Bilirubin: 2.0 mg/dL at 4/0/9811  9:14 AM  INR(ratio): 1.96 at 10/20/2019  3:36 AM  Age: 38 years 5 months    DM: BG have been better controlled ranging 80-160s.   - Continue lantus 50u and SSI.   ??  Hypothyroidism: Continue home synthroid.   HLD: Continued formulary equivalent of home statin.   ??  Code status: Confirmed full code on arrival and with husband as healthcare proxy.   ??  Daily Checklist:  Diet: Regular Diet  DVT PPx: SCDs   GI PPx: PPI  Dispo: Admit to [facility], floor    Objective:   Temp:  [36.4 ??C-36.9 ??C] 36.9 ??C  Heart Rate:  [66-73] 66  Resp:  [16-20] 20  BP: (108-122)/(41-53) 114/44  SpO2:  [94 %-99 %] 99 %    Gen: Chronically ill-appearing, appears older than stated age. Slow to respond to questions.   Heart: RRR. Normal S1 and S2. No appreciable murmurs.   Lungs: CTAB, no increased WOB.   Abdomen: Normoactive bowel sounds, soft, NTND, no rebound/guarding. No appreciable fluid wave on exam.    Extremities: No peripheral edema, warm, well-perfused. No asterixis on exam.   Neuro: A&O x3.     Labs/Studies: Labs and Studies from the last 24hrs per EMR and Reviewed

## 2019-10-21 LAB — COMPREHENSIVE METABOLIC PANEL
ALBUMIN: 3.1 g/dL — ABNORMAL LOW (ref 3.5–5.0)
ALKALINE PHOSPHATASE: 63 U/L (ref 38–126)
ALT (SGPT): 17 U/L (ref <35)
ANION GAP: 6 mmol/L — ABNORMAL LOW (ref 7–15)
AST (SGOT): 32 U/L (ref 14–38)
BILIRUBIN TOTAL: 1.9 mg/dL — ABNORMAL HIGH (ref 0.0–1.2)
BLOOD UREA NITROGEN: 24 mg/dL — ABNORMAL HIGH (ref 7–21)
BUN / CREAT RATIO: 22
CALCIUM: 8.3 mg/dL — ABNORMAL LOW (ref 8.5–10.2)
CO2: 22 mmol/L (ref 22.0–30.0)
CREATININE: 1.08 mg/dL — ABNORMAL HIGH (ref 0.60–1.00)
EGFR CKD-EPI AA FEMALE: 63 mL/min/{1.73_m2} (ref >=60)
EGFR CKD-EPI NON-AA FEMALE: 54 mL/min/{1.73_m2} — ABNORMAL LOW (ref >=60)
GLUCOSE RANDOM: 88 mg/dL (ref 70–179)
POTASSIUM: 3.6 mmol/L (ref 3.5–5.0)
PROTEIN TOTAL: 5.5 g/dL — ABNORMAL LOW (ref 6.5–8.3)
SODIUM: 137 mmol/L (ref 135–145)

## 2019-10-21 LAB — CBC W/ AUTO DIFF
BASOPHILS RELATIVE PERCENT: 0.9 %
EOSINOPHILS ABSOLUTE COUNT: 0 10*9/L (ref 0.0–0.4)
EOSINOPHILS RELATIVE PERCENT: 2 %
HEMATOCRIT: 25.1 % — ABNORMAL LOW (ref 36.0–46.0)
HEMOGLOBIN: 8.2 g/dL — ABNORMAL LOW (ref 12.0–16.0)
LARGE UNSTAINED CELLS: 3 % (ref 0–4)
LYMPHOCYTES ABSOLUTE COUNT: 0.3 10*9/L — ABNORMAL LOW (ref 1.5–5.0)
LYMPHOCYTES RELATIVE PERCENT: 18.1 %
MEAN CORPUSCULAR HEMOGLOBIN CONC: 32.4 g/dL (ref 31.0–37.0)
MEAN CORPUSCULAR HEMOGLOBIN: 29.2 pg (ref 26.0–34.0)
MEAN CORPUSCULAR VOLUME: 89.9 fL (ref 80.0–100.0)
MEAN PLATELET VOLUME: 16.1 fL — ABNORMAL HIGH (ref 7.0–10.0)
MONOCYTES ABSOLUTE COUNT: 0.1 10*9/L — ABNORMAL LOW (ref 0.2–0.8)
MONOCYTES RELATIVE PERCENT: 9.1 %
NEUTROPHILS ABSOLUTE COUNT: 1 10*9/L — ABNORMAL LOW (ref 2.0–7.5)
NEUTROPHILS RELATIVE PERCENT: 66.9 %
PLATELET COUNT: 31 10*9/L — ABNORMAL LOW (ref 150–440)
RED BLOOD CELL COUNT: 2.79 10*12/L — ABNORMAL LOW (ref 4.00–5.20)
RED CELL DISTRIBUTION WIDTH: 21.3 % — ABNORMAL HIGH (ref 12.0–15.0)
WBC ADJUSTED: 1.6 10*9/L — ABNORMAL LOW (ref 4.5–11.0)

## 2019-10-21 LAB — CALCIUM: Calcium:MCnc:Pt:Ser/Plas:Qn:: 8.3 — ABNORMAL LOW

## 2019-10-21 LAB — INR: Coagulation tissue factor induced.INR:RelTime:Pt:PPP:Qn:Coag: 2.08

## 2019-10-21 LAB — MAGNESIUM: Magnesium:MCnc:Pt:Ser/Plas:Qn:: 2.1

## 2019-10-21 LAB — RED CELL DISTRIBUTION WIDTH: Lab: 21.3 — ABNORMAL HIGH

## 2019-10-21 LAB — PHOSPHORUS: Phosphate:MCnc:Pt:Ser/Plas:Qn:: 3

## 2019-10-21 MED ORDER — BUMETANIDE 2 MG TABLET
ORAL_TABLET | Freq: Every day | ORAL | 1 refills | 30.00000 days | Status: CP
Start: 2019-10-21 — End: 2019-12-20

## 2019-10-21 NOTE — Unmapped (Deleted)
Physician Discharge Summary    Identifying Information:   Marilyn French  [birth year]  [id number]    Admit date: 10/05/2019    Discharge date: 10/21/2019     Discharge Service: Med General [facility] (MDW)    Discharge Attending Physician: Reece Packer, MD    Discharge to: Home with Home Health and/or PT/OT    Discharge Diagnoses:  Principal Problem (Resolved):    Upper GI bleed  Active Problems:    NAFLD (nonalcoholic fatty liver disease)    Cirrhosis (CMS-HCC)    GAVE (gastric antral vascular ectasia)    Hypothyroid    Hepatic encephalopathy (CMS-HCC)    Iron deficiency anemia    Thrombocytopenia (CMS-HCC)    Hospital Course:   Marilyn French??is a 64 y.o.??female??with history of NASH cirrhosis s/p TIPS c/b worsening HE, prior variceal bleed, GAVE, HTN, pHTN, anxiety, chronic microcytic anemia, DM, and recent OSH admission for melena who presents with ongoing melena and worsening encephalopathy c/f GI bleed s/p EGD and colonoscopy with APC, hemoglobin now stable at 8.2  ??  Hepatic Encephalopathy:??Patient was significantly encephalopathic upon admission. She was very lethargic and very slow to answer questions. Initial oncern for possible infection but infectious work-up negative (blood and urine, no ascites present). Major depression might be a significant contributor to her slowed speech and poor functional status. Additionally, her liver disease may frankly be worsening as suggested by her current MELD. Mental status slowly improved over the course of admission with aggressive lactulose (40mg  QID) and rifaximin.   ??  Melena - Microcytic Anemia:??Ongoing problem for her in the past but reports worsening melena that prompted presentation to OSH.??Initial hemoglobin there was 5.8??on 1/11??and she ultimately??req'd 4U RBCs over the course of a 5 day admission.??She is s/p EGD on 1/19, APC to GAVE at that time. Hemoglobin was still lower than her baseline and it was decided that she would benefit from a c-scope given known history of colonic AVMs as seen on 2017 scope. She underwent c-scope 1/29 with APC to three colonic AVMs. On 1/30, hgb dropped from 7.1 to 6.1 with no obvious signs of bleeding, most likely from post-procedural oozing.??On 1/31, some black stool overnight concerning for upper GI source, may be recurrent from known GAVE. Hemoglobin remained stable from 2/1 to 2/2. Hgb 8.2 on day of discharge.  ??  AKI - Electrolyte derangements: On 1/23, it was noted that the patient's creatinine rose to 1.64 from a baseline ~1.??Also with hypercalcemia to 11. Vitamin D and ionized calcium wnl. Thyroid studies wnl. PTN wnl. Thought to be 2/2 hypovolemia with poor PO intake. She received multiple days of 1g/kg albumin and creatinine improved slowly back to baseline by 1/28. Diuretics were held.  ??  Anxiety/Depression:??Pt endorses major sadness and depressive symptoms in regards to her poor condition and low candidacy for transplant.??Offered to have palliative care team to come by to discuss goals of care going forward should she need to be hospitalized again in the future. Patient is quite resistant to this and states that she really would rather not have that conversation during this admission. Home buspar was continued and home zoloft was increased to 100mg  daily.    ??  NASH Cirrhosis s/p TIPS:??Follows with Dr. Raford Pitcher.??Overall appears well compensated other than encephalopathy and??with volume status actually much improved per patient and discharge summary from OSH; down??12 lbs since presentation on 1/11 with IV diuresis and was transitioned to home bumex 2 mg BID in addition to new-start metolazone 5 mg daily. No obvious  hematochezia or melena noted. Diuretics (bumex 2 mg BID, aldactone 100 mg daily, metolazone 5 mg daily) were held per hepatology during admission. Plan to discharge home on bumex 2mg  daily and spiro 100mg  daily. She will obtain labs in 1 week at [facility].  ??  DM: Was receiving lantus 55U in AM, 10U in PM at OSH. Transitioned to lantus 60U with SSI during admission.  ??  Hypothyroidism:??Continued home synthroid throughout admission  ??  HLD:??Crestor -->??Lipitor per formulary     Procedures:  EGD (10/07/19)  Colonoscopy (10/17/19)  _____________________________________________________________________________  Discharge Day Services:  BP 125/46  - Pulse 67  - Temp 37 ??C (Oral)  - Resp 18  - Ht 162.6 cm (5' 4)  - Wt 78 kg (172 lb)  - LMP  (LMP Unknown)  - SpO2 95%  - BMI 29.52 kg/m??   Pt seen on the day of discharge and determined appropriate for discharge.    Condition at Discharge: fair    Length of Discharge: I spent greater than 30 mins in the discharge of this patient.  _____________________________________________________________________________  Discharge Medications:     Your Medication List      CHANGE how you take these medications    bumetanide 2 MG tablet  Commonly known as: BUMEX  Take 1 tablet (2 mg total) by mouth daily.  What changed: when to take this        CONTINUE taking these medications    busPIRone 10 MG tablet  Commonly known as: BUSPAR  Take 10 mg by mouth two (2) times a day.     cholecalciferol 1,000 unit (25 mcg) tablet  Generic drug: cholecalciferol (vitamin D3)  Take 1,000 Units by mouth every other day.     flintstones complete tablet  Generic drug: pediatric multivit-iron-min  Chew 2 tablets daily.     insulin lispro 100 unit/mL injection  Commonly known as: HumaLOG  INJECT 15 TO 35 UNITS SUBCUTANEUOSLY PER SLIDING SCALE 3 TIMES DAILY     lactulose 10 gram/15 mL solution  Commonly known as: CHRONULAC  Take 20 g by mouth Four (4) times a day.     LANTUS SOLOSTAR U-100 INSULIN 100 unit/mL (3 mL) injection pen  Generic drug: insulin glargine  Inject 0.45 mL (45 Units total) under the skin daily.     levothyroxine 25 MCG tablet  Commonly known as: SYNTHROID  Take 25 mcg by mouth daily.     lidocaine-prilocaine 2.5-2.5 % cream  Commonly known as: EMLA     NovoLOG Flexpen U-100 Insulin 100 unit/mL (3 mL) injection pen  Generic drug: insulin ASPART  Use with given sliding scale    CBG 70-120: 0 units  CBG 121-150: 1 unit  CBG 151-200: 2 units  CBG 201-250: 3 units  CBG 251-300: 5 units  CBG 301-350: 7 units  CBG greater than 351: 9 units     OXYGEN-AIR DELIVERY SYSTEMS MISC  Inhale 2 L nightly.     pantoprazole 40 MG tablet  Commonly known as: PROTONIX  Take 40 mg by mouth Two (2) times a day.     pen needle, diabetic 31 gauge x 1/4 (6 mm) Ndle     potassium chloride 20 MEQ CR tablet  Commonly known as: KLOR-CON  Take 20 mEq by mouth daily.     rifAXIMin 550 mg Tab  Commonly known as: XIFAXAN  Take 1 tablet (550 mg total) by mouth Two (2) times a day.     rosuvastatin 5 MG tablet  Commonly known as: CRESTOR  Take 5 mg by mouth every other day.     sertraline 50 MG tablet  Commonly known as: ZOLOFT  Take 1 tablet (50 mg total) by mouth daily. 1 tablet (50mg ) by mouth daily.     spironolactone 100 MG tablet  Commonly known as: ALDACTONE  Take 1 tablet (100 mg total) by mouth daily.          _____________________________________________________________________________  Pending Test Results (if blank, then none):      Most Recent Labs:  Microbiology Results (last day)     ** No results found for the last 24 hours. **          Lab Results   Component Value Date    WBC 1.6 (L) 10/21/2019    HGB 8.2 (L) 10/21/2019    HCT 25.1 (L) 10/21/2019    PLT 31 (L) 10/21/2019       Lab Results   Component Value Date    NA 137 10/21/2019    K 3.6 10/21/2019    CL 109 (H) 10/21/2019    CO2 22.0 10/21/2019    BUN 24 (H) 10/21/2019    CREATININE 1.08 (H) 10/21/2019    CALCIUM 8.3 (L) 10/21/2019    MG 2.1 10/21/2019    PHOS 3.0 10/21/2019       Lab Results   Component Value Date    ALKPHOS 63 10/21/2019    BILITOT 1.9 (H) 10/21/2019    BILIDIR 0.40 10/06/2019    PROT 5.5 (L) 10/21/2019    ALBUMIN 3.1 (L) 10/21/2019    ALT 17 10/21/2019    AST 32 10/21/2019    GGT 32 08/12/2019       Lab Results   Component Value Date    PT 23.9 (H) 10/21/2019    INR 2.08 10/21/2019    APTT 33.0 10/05/2019     Hospital Radiology:  Ecg 12 Lead    Result Date: 10/20/2019  SINUS RHYTHM WITH 1ST DEGREE AV BLOCK LEFT ANTERIOR FASCICULAR BLOCK POOR R WAVE PROGRESSION IN ANTERIOR PRECORDIAL LEADS ABNORMAL ECG WHEN COMPARED WITH ECG OF 10-Oct-2019 13:39, NO SIGNIFICANT CHANGE WAS FOUND Confirmed by Rose-jones, Lisa ([id number]) on 10/20/2019 9:37:06 AM    Ecg 12 Lead    Result Date: 10/12/2019  SINUS RHYTHM WITH 1ST DEGREE AV BLOCK INTRAVENTRICULAR CONDUCTION DELAY LEFT ANTERIOR FASCICULAR BLOCK ABNORMAL ECG WHEN COMPARED WITH ECG OF 07-Jul-2018 20:01, QRS DURATION HAS INCREASED Confirmed by Christella Noa ([id number]) on 10/12/2019 6:00:50 PM    Xr Chest Portable    Result Date: 10/12/2019  EXAM: XR CHEST PORTABLE DATE: 10/12/2019 2:20 PM ACCESSION: [id number] UN DICTATED: 10/12/2019 2:31 PM INTERPRETATION LOCATION: [facility] CLINICAL INDICATION: 64 year old Female with FEVER  COMPARISON: 10/10/2019 TECHNIQUE: Portable Chest Radiograph. FINDINGS: Right chest wall Port-A-Cath with tip overlying the upper SVC. Low lung volumes with associated hypoventilatory changes. Patchy mixed interstitial and airspace opacities throughout the left mid and lower lobe which is nonspecific and may represent asymmetric edema, infection, or inflammation. No appreciable pleural effusion or pneumothorax. No significant change in appearance of the cardiomediastinal silhouette. Right upper quadrant TIPS stent. Unchanged indeterminate partially imaged radiodensity within the periventricular midline right upper abdomen.     Heterogeneous opacities predominantly within the left lung which are likely at least in part secondary to bronchovascular crowding from hypoventilation. However asymmetry raises suspicion for inflammation or infection.    Xr Chest Portable    Result Date: 10/10/2019  EXAM: XR CHEST PORTABLE DATE: 10/10/2019  10:51 AM ACCESSION: [id number] UN DICTATED: 10/10/2019 10:51 AM INTERPRETATION LOCATION: [facility] CLINICAL INDICATION: 64 years old Female with CHEST PAIN  COMPARISON: 10/07/2019 TECHNIQUE: Portable Chest Radiograph. FINDINGS: Unchanged right chest Port-A-Cath Lungs radiographically clear. No pleural effusion or pneumothorax. Stable cardiomediastinal silhouette. TIPS. Partially imaged radiodensity overlying the epigastrium, indeterminate     Clear lungs. Indeterminate radiodensity overlying the epigastrium.    Xr Chest Portable    Result Date: 10/07/2019  EXAM: XR CHEST PORTABLE DATE: 10/07/2019 ACCESSION: [id number] UN DICTATED: 10/07/2019 3:01 PM [facility]PRETATION LOCATION: [facility] CLINICAL INDICATION: 64 years old Female with ALTERED MENTAL STATUS  COMPARISON: 07/11/2019 TECHNIQUE: Portable Chest Radiograph. FINDINGS: Unchanged right Port-A-Cath. Mild bibasal atelectasis. No pleural effusion or pneumothorax Stable cardiomediastinal silhouette.     No acute airspace disease    [place] Tips Doppler    Result Date: 10/08/2019  EXAM: [place] TIPS DOPPLER DATE: 10/07/2019 8:52 PM ACCESSION: [id number] UN DICTATED: 10/07/2019 8:53 PM INTERPRETATION LOCATION: [facility] CLINICAL INDICATION: 64 years old Female with hx of TIPS p/w hepatic encephalopathy and GIB COMPARISON: MRI of the abdomen from 02/2019; TIPS ultrasound from 06/18/2019 TECHNIQUE:  Ultrasound views of the complete abdomen and liver vasculature were obtained using gray scale and color and spectral Doppler imaging. FINDINGS: HEPATOBILIARY: Left hepatic lobe mostly obscured by shadowing/bowel gas. The liver is heterogeneous with an echogenic echotexture. No focal hepatic lesions. No intrahepatic biliary ductal dilatation. The common bile duct is normal in caliber. The gallbladder is surgically absent. PANCREAS: Visualized portion is unremarkable. SPLEEN: Enlarged, measuring up to 18.8 cm. Normal echotexture. KIDNEYS: Normal in size and echotexture. No solid masses or calculi. No hydronephrosis. VESSELS: - Portal vein: The main portal vein is patent with hepatopetal flow. The left and right portal veins are not well visualized. - TIPS: The TIPS stent is patent. TIPS stent extends from the posterior right portal vein to the right hepatic vein.         Proximal TIPS velocity: 1.8-2.1, previously 2.51         Mid TIPS velocity: 2.3, previously 2.34         Distal TIPS velocity: 1.0, previously 0.76 - Splenic vein: Patent, with hepatopetal flow. - Hepatic veins/IVC: The IVC, left, middle and right hepatic veins are patent with bi/triphasic waveforms. - Hepatic artery: Patent - Visualized proximal aorta:  unremarkable OTHER: No ascites.     Patent TIPS with elevated velocities throughout the stent, indicative of mild stenosis. Focal color aliasing noted at the distal portion of the TIPS stent but without a significant step-off in measured velocities along the course of the stent, possibly mild or focal stenosis or occlusion. This overall appears slightly improved compared with previous examination. Hepatopedal flow within the main portal vein, as before. The right and left portal veins are again not well visualized . Small, heterogeneous liver consistent with cirrhosis. No sonographically detected hepatic lesions. Please note that MRI is more sensitive for detection of small hepatic lesions. Splenomegaly. Please see below for data measurements: Liver: 13.25 cm Common bile duct: 0.72 cm Right kidney: 11.36 cm Left kidney: 12.74 cm TIPS Proximal TIPS velocity: 2.11 m/s Mid TIPS velocity: 2.28 m/s Distal TIPS velocity: 1.06 m/s Main portal vein diameter: 1.27 cm Main portal vein velocity: 0.49 m/s Main portal vein flow: hepatopetal Right portal vein flow: Not well visualized Left portal vein flow: Not well visualized Common hepatic artery: Patent Left hepatic vein flow: triphasic Middle hepatic vein flow: triphasic Right hepatic vein flow: tips Inferior vena cava flow: biphasic Splenic vein midline:  hepatopetal Splenic vein proximal: hepatopetal Aorta: partially visualized Inferior vena cava: partially visualized Spleen: 18.83 cm Abdominal free fluid visualized: no      _____________________________________________________________________________  Discharge Instructions:   Activity Instructions     Activity as tolerated            Diet Instructions     Discharge diet (specify)      Discharge Nutrition Therapy: General          Other Instructions     Call MD for:  difficulty breathing, headache or visual disturbances      Call MD for:  extreme fatigue      Call MD for:  persistent dizziness or light-headedness      Call MD for:  persistent nausea or vomiting      Call MD for:  severe uncontrolled pain      Call MD for: Temperature > 38.5 Celsius ( > 101.3 Fahrenheit)      Discharge instructions      You were admitted to [facility] for a GI bleed. You underwent an EGD and colonoscopy with intervention to help stop the bleeding. Your hemoglobin levels have been stable and we believe you are safe to return home with close follow up. Please return to the ED if you have significant blood in your stool or vomit. You will be discharged on Bumex 2 mg daily and Spironolactone 100 mg daily for your diuretics. Please have labs obtained in 1 week at [facility].    1) Please take your medications as prescribed and note the following changes listed in your after visit summary 2) Please make all follow up appointments and be sure to take all your medications with you so your provider can better guide your care. 3) Seek medical care if you develop any changes in your mental status, worsening abdominal pain, fevers greater than 101.5, any unexplained/unrelieved shortness of breath, uncontrolled nausea and vomiting that keeps you from remaining hydrated or taking your medication, worsening swelling, or any other concerning symptoms. If following discharge from the hospital notice the development or worsening of any symptoms such as nausea, vomiting, chest pain, shortness of breath, fevers, or chills, please return to the emergency department. If you develop these symptoms, or if you have trouble obtaining any of your medications, you can contact the [facility] at [phone], or you can go to the [facility] at [facility] in [place]. You can also call the [facility] Link at [phone]. Please continue to follow-up with your outpatient care providers. Some of your follow-up appointments have been listed below. Please be sure to attend these appointments at the dates and times indicated. Your discharge medications are listed above. Please continue to take these medications as directed by the pharmacist. Note the following changes from your previous medication list.               Follow Up instructions and Outpatient Referrals     Ambulatory referral to Home Health      Is this a [place] or [place] Home Health referral?: No    Physician to follow patient's care: PCP    Disciplines requested:  Nursing  Physical Therapy  Occupational Therapy  Medical Social Work  Home Health Aide       Nursing requested: Teaching/skilled observation and assessment    What teaching is needed (new diagnosis? new medications?): see orders    Physical Therapy requested:  Home safety evaluation  Ambulation training  Transfer training  Strengthening exercises  Evaluate and treat       Occupational Therapy Requested:  Home safety evaluation  Strengthening exercises  Evaluate and treat  Transfer training       Medical Social Work requested: General area of patient need    General area of patient need: assistance with referrals    Requested start of care date: Routine (within 48 hours)    Ambulatory referral to Home Health      Is this a [employer] or [facility] Patient?: No    Physician to follow patient's care: PCP    Disciplines requested:  Nursing Comment - home health aide also  Physical Therapy  Occupational Therapy  Home Health Aide  Medical Social Work       Nursing requested: Teaching/skilled observation and assessment Comment - patient is covid recovered  Other: (please enter in comments)       What teaching is needed (new diagnosis? new medications?): med management and teaching, DM and hepatic encephalopathy management and teaching, VS assessment    Physical Therapy requested:  Ambulation training  Evaluate and treat  Transfer training  Weight bearing status       Weight Bearing Status (please provide detail): full weightbearing as tolerated    Occupational Therapy Requested:  Home safety evaluation  ADL or IADL training  Evaluate and treat  Weight bearing status       Weight Bearing Status (please provide detail): full weight bearing as tolerated    Medical Social Work requested: General area of patient need    General area of patient need: please assist with community resource referral for patient/family support; assist with SNF referral if home plan fails    Current community services in place: none    Requested start of care date: Routine (within 48 hours)    Special instructions: please note patient is covid recovered    Do you want ongoing co-management?: No    Care coordination required?: No    Call MD for:  difficulty breathing, headache or visual disturbances      Call MD for:  extreme fatigue      Call MD for:  persistent dizziness or light-headedness      Call MD for:  persistent nausea or vomiting      Call MD for:  severe uncontrolled pain      Call MD for: Temperature > 38.5 Celsius ( > 101.3 Fahrenheit)      Discharge instructions            Appointments which have been scheduled for you    Nov 03, 2019 10:00 AM  (Arrive by 9:30 AM)  RETURN  PSYCHOLOGY with Adalberto Cole, PhD  [facility] TRANSPLANT SURGERY [facility] REGION) [address]  [place] [address]  [phone]      Nov 03, 2019 12:00 PM  (Arrive by 11:30 AM)  RETURN  HEPATOLOGY with Melvern Sample, ANP  [facility] LIVER TRANSPLANT [facility] REGION) [address]  [place] [address]  [phone]      Nov 03, 2019  1:15 PM  (Arrive by 12:45 PM)  RETURN 15 with Chirag Lanney Gins, MD  [facility] TRANSPLANT SURGERY [facility] REGION) [address]  [place] [address]  [phone]      Dec 22, 2019 10:30 AM  (Arrive by 10:00 AM)  RETURN  HEPATOLOGY with Melonie Florida  Ruffin Frederick, MD  [facility] REGION) [address]  [place] [address]  [phone]

## 2019-10-21 NOTE — Unmapped (Signed)
Problem: Adult Inpatient Plan of Care  Goal: Plan of Care Review  Outcome: Progressing  Goal: Patient-Specific Goal (Individualization)  Outcome: Progressing  Goal: Absence of Hospital-Acquired Illness or Injury  Outcome: Progressing  Goal: Optimal Comfort and Wellbeing  Outcome: Progressing  Goal: Readiness for Transition of Care  Outcome: Progressing  Goal: Rounds/Family Conference  Outcome: Progressing     Problem: Fall Injury Risk  Goal: Absence of Fall and Fall-Related Injury  Outcome: Progressing     Problem: Self-Care Deficit  Goal: Improved Ability to Complete Activities of Daily Living  Outcome: Progressing

## 2019-10-21 NOTE — Unmapped (Signed)
Plan for discharge home this shift, with hh pt/ot. H&H stable. Medications given as ordered, except second dose of insulin and lactulose. Pt waiting for meal at this time. Pt up to bsc. Port de accessed before discharge. VSS. Will continue to monitor.  Problem: Adult Inpatient Plan of Care  Goal: Plan of Care Review  Outcome: Progressing  Goal: Patient-Specific Goal (Individualization)  Outcome: Progressing  Goal: Absence of Hospital-Acquired Illness or Injury  Outcome: Progressing  Goal: Optimal Comfort and Wellbeing  Outcome: Progressing  Goal: Readiness for Transition of Care  Outcome: Progressing  Goal: Rounds/Family Conference  Outcome: Progressing     Problem: Fall Injury Risk  Goal: Absence of Fall and Fall-Related Injury  Outcome: Progressing     Problem: Self-Care Deficit  Goal: Improved Ability to Complete Activities of Daily Living  Outcome: Progressing

## 2019-10-21 NOTE — Unmapped (Signed)
VSS.  No c/o pain or N/V. Blood sugar 206 at bedtime requiring SSI. Pt voiding/stooling. Pt remains free from falls this shift. Labs drawn this morning- H&H stable. POC reviewed w/ pt. Questions encouraged and answered. Pt resting comfortably throughout shift. Will continue to  Monitor.     Problem: Adult Inpatient Plan of Care  Goal: Plan of Care Review  Outcome: Progressing  Goal: Patient-Specific Goal (Individualization)  Outcome: Progressing  Goal: Absence of Hospital-Acquired Illness or Injury  Outcome: Progressing  Goal: Optimal Comfort and Wellbeing  Outcome: Progressing  Goal: Readiness for Transition of Care  Outcome: Progressing  Goal: Rounds/Family Conference  Outcome: Progressing     Problem: Fall Injury Risk  Goal: Absence of Fall and Fall-Related Injury  Outcome: Progressing     Problem: Self-Care Deficit  Goal: Improved Ability to Complete Activities of Daily Living  Outcome: Progressing     Problem: Skin Injury Risk Increased  Goal: Skin Health and Integrity  Outcome: Progressing     Problem: Diabetes Comorbidity  Goal: Blood Glucose Level Within Desired Range  Outcome: Progressing     Problem: Infection  Goal: Infection Symptom Resolution  Outcome: Progressing

## 2019-10-21 NOTE — Unmapped (Signed)
Physician Discharge Summary    Identifying Information:   Marilyn French  [birth year]  [id number]    Admit date: 10/05/2019    Discharge date: 10/21/2019     Discharge Service: Med General [facility] (MDW)    Discharge Attending Physician: Reece Packer, MD    Discharge to: Home with Home Health and/or PT/OT    Discharge Diagnoses:  Principal Problem (Resolved):    Upper GI bleed  Active Problems:    NAFLD (nonalcoholic fatty liver disease)    Cirrhosis (CMS-HCC)    GAVE (gastric antral vascular ectasia)    Hypothyroid    Hepatic encephalopathy (CMS-HCC)    Iron deficiency anemia    Thrombocytopenia (CMS-HCC)    Hospital Course:   Marilyn French??is a 64 y.o.??female??with history of NASH cirrhosis s/p TIPS c/b worsening HE, prior variceal bleed, GAVE, HTN, pHTN, anxiety, chronic microcytic anemia, DM, and recent OSH admission for melena who presents with ongoing melena and worsening encephalopathy c/f GI bleed s/p EGD and colonoscopy with APC, hemoglobin now stable at 8.2  ??  Hepatic Encephalopathy:??Patient was significantly encephalopathic upon admission. She was very lethargic and very slow to answer questions. Initial oncern for possible infection but infectious work-up negative (blood and urine, no ascites present). Major depression might be a significant contributor to her slowed speech and poor functional status. Additionally, her liver disease may frankly be worsening as suggested by her current MELD. Mental status slowly improved over the course of admission with aggressive lactulose (40mg  QID) and rifaximin.   ??  Melena - Microcytic Anemia:??Ongoing problem for her in the past but reports worsening melena that prompted presentation to OSH.??Initial hemoglobin there was 5.8??on 1/11??and she ultimately??req'd 4U RBCs over the course of a 5 day admission.??She is s/p EGD on 1/19, APC to GAVE at that time. Hemoglobin was still lower than her baseline and it was decided that she would benefit from a c-scope given known history of colonic AVMs as seen on 2017 scope. She underwent c-scope 1/29 with APC to three colonic AVMs. On 1/30, hgb dropped from 7.1 to 6.1 with no obvious signs of bleeding, most likely from post-procedural oozing.??On 1/31, some black stool overnight concerning for upper GI source, may be recurrent from known GAVE. Hemoglobin remained stable from 2/1 to 2/2. Hgb 8.2 on day of discharge.  ??  AKI - Electrolyte derangements: On 1/23, it was noted that the patient's creatinine rose to 1.64 from a baseline ~1.??Also with hypercalcemia to 11. Vitamin D and ionized calcium wnl. Thyroid studies wnl. PTN wnl. Thought to be 2/2 hypovolemia with poor PO intake. She received multiple days of 1g/kg albumin and creatinine improved slowly back to baseline by 1/28. Diuretics were held.  ??  Anxiety/Depression:??Pt endorses major sadness and depressive symptoms in regards to her poor condition and low candidacy for transplant.??Offered to have palliative care team to come by to discuss goals of care going forward should she need to be hospitalized again in the future. Patient is quite resistant to this and states that she really would rather not have that conversation during this admission. Home buspar was continued and home zoloft was increased to 100mg  daily.    ??  NASH Cirrhosis s/p TIPS:??Follows with Dr. Raford Pitcher.??Overall appears well compensated other than encephalopathy and??with volume status actually much improved per patient and discharge summary from OSH; down??12 lbs since presentation on 1/11 with IV diuresis and was transitioned to home bumex 2 mg BID in addition to new-start metolazone 5 mg daily. No obvious  hematochezia or melena noted. Diuretics (bumex 2 mg BID, aldactone 100 mg daily, metolazone 5 mg daily) were held per hepatology during admission. Plan to discharge home on bumex 2mg  daily and spiro 100mg  daily. She will obtain labs in 1 week at [facility].  ??  DM: Was receiving lantus 55U in AM, 10U in PM at OSH. Transitioned to lantus 60U with SSI during admission.  ??  Hypothyroidism:??Continued home synthroid throughout admission  ??  HLD:??Crestor -->??Lipitor per formulary     Procedures:  EGD (10/07/19)  Colonoscopy (10/17/19)  _____________________________________________________________________________  Discharge Day Services:  BP 125/46  - Pulse 67  - Temp 37 ??C (Oral)  - Resp 18  - Ht 162.6 cm (5' 4)  - Wt 78 kg (172 lb)  - LMP  (LMP Unknown)  - SpO2 95%  - BMI 29.52 kg/m??   Pt seen on the day of discharge and determined appropriate for discharge.    Condition at Discharge: fair    Length of Discharge: I spent greater than 30 mins in the discharge of this patient.  _____________________________________________________________________________  Discharge Medications:     Your Medication List      CHANGE how you take these medications    bumetanide 2 MG tablet  Commonly known as: BUMEX  Take 1 tablet (2 mg total) by mouth daily.  What changed: when to take this        CONTINUE taking these medications    busPIRone 10 MG tablet  Commonly known as: BUSPAR  Take 10 mg by mouth two (2) times a day.     cholecalciferol 1,000 unit (25 mcg) tablet  Generic drug: cholecalciferol (vitamin D3)  Take 1,000 Units by mouth every other day.     flintstones complete tablet  Generic drug: pediatric multivit-iron-min  Chew 2 tablets daily.     insulin lispro 100 unit/mL injection  Commonly known as: HumaLOG  INJECT 15 TO 35 UNITS SUBCUTANEUOSLY PER SLIDING SCALE 3 TIMES DAILY     lactulose 10 gram/15 mL solution  Commonly known as: CHRONULAC  Take 20 g by mouth Four (4) times a day.     LANTUS SOLOSTAR U-100 INSULIN 100 unit/mL (3 mL) injection pen  Generic drug: insulin glargine  Inject 0.45 mL (45 Units total) under the skin daily.     levothyroxine 25 MCG tablet  Commonly known as: SYNTHROID  Take 25 mcg by mouth daily.     lidocaine-prilocaine 2.5-2.5 % cream  Commonly known as: EMLA     NovoLOG Flexpen U-100 Insulin 100 unit/mL (3 mL) injection pen  Generic drug: insulin ASPART  Use with given sliding scale    CBG 70-120: 0 units  CBG 121-150: 1 unit  CBG 151-200: 2 units  CBG 201-250: 3 units  CBG 251-300: 5 units  CBG 301-350: 7 units  CBG greater than 351: 9 units     OXYGEN-AIR DELIVERY SYSTEMS MISC  Inhale 2 L nightly.     pantoprazole 40 MG tablet  Commonly known as: PROTONIX  Take 40 mg by mouth Two (2) times a day.     pen needle, diabetic 31 gauge x 1/4 (6 mm) Ndle     potassium chloride 20 MEQ CR tablet  Commonly known as: KLOR-CON  Take 20 mEq by mouth daily.     rifAXIMin 550 mg Tab  Commonly known as: XIFAXAN  Take 1 tablet (550 mg total) by mouth Two (2) times a day.     rosuvastatin 5 MG tablet  Commonly known as: CRESTOR  Take 5 mg by mouth every other day.     sertraline 50 MG tablet  Commonly known as: ZOLOFT  Take 1 tablet (50 mg total) by mouth daily. 1 tablet (50mg ) by mouth daily.     spironolactone 100 MG tablet  Commonly known as: ALDACTONE  Take 1 tablet (100 mg total) by mouth daily.          _____________________________________________________________________________  Pending Test Results (if blank, then none):      Most Recent Labs:  Microbiology Results (last day)     ** No results found for the last 24 hours. **          Lab Results   Component Value Date    WBC 1.6 (L) 10/21/2019    HGB 8.2 (L) 10/21/2019    HCT 25.1 (L) 10/21/2019    PLT 31 (L) 10/21/2019       Lab Results   Component Value Date    NA 137 10/21/2019    K 3.6 10/21/2019    CL 109 (H) 10/21/2019    CO2 22.0 10/21/2019    BUN 24 (H) 10/21/2019    CREATININE 1.08 (H) 10/21/2019    CALCIUM 8.3 (L) 10/21/2019    MG 2.1 10/21/2019    PHOS 3.0 10/21/2019       Lab Results   Component Value Date    ALKPHOS 63 10/21/2019    BILITOT 1.9 (H) 10/21/2019    BILIDIR 0.40 10/06/2019    PROT 5.5 (L) 10/21/2019    ALBUMIN 3.1 (L) 10/21/2019    ALT 17 10/21/2019    AST 32 10/21/2019    GGT 32 08/12/2019       Lab Results   Component Value Date    PT 23.9 (H) 10/21/2019    INR 2.08 10/21/2019    APTT 33.0 10/05/2019     Hospital Radiology:  Ecg 12 Lead    Result Date: 10/20/2019  SINUS RHYTHM WITH 1ST DEGREE AV BLOCK LEFT ANTERIOR FASCICULAR BLOCK POOR R WAVE PROGRESSION IN ANTERIOR PRECORDIAL LEADS ABNORMAL ECG WHEN COMPARED WITH ECG OF 10-Oct-2019 13:39, NO SIGNIFICANT CHANGE WAS FOUND Confirmed by Rose-jones, Lisa ([id number]) on 10/20/2019 9:37:06 AM    Ecg 12 Lead    Result Date: 10/12/2019  SINUS RHYTHM WITH 1ST DEGREE AV BLOCK INTRAVENTRICULAR CONDUCTION DELAY LEFT ANTERIOR FASCICULAR BLOCK ABNORMAL ECG WHEN COMPARED WITH ECG OF 07-Jul-2018 20:01, QRS DURATION HAS INCREASED Confirmed by Christella Noa ([id number]) on 10/12/2019 6:00:50 PM    Xr Chest Portable    Result Date: 10/12/2019  EXAM: XR CHEST PORTABLE DATE: 10/12/2019 2:20 PM ACCESSION: [id number] UN DICTATED: 10/12/2019 2:31 PM INTERPRETATION LOCATION: [facility] CLINICAL INDICATION: 64 year old Female with FEVER  COMPARISON: 10/10/2019 TECHNIQUE: Portable Chest Radiograph. FINDINGS: Right chest wall Port-A-Cath with tip overlying the upper SVC. Low lung volumes with associated hypoventilatory changes. Patchy mixed interstitial and airspace opacities throughout the left mid and lower lobe which is nonspecific and may represent asymmetric edema, infection, or inflammation. No appreciable pleural effusion or pneumothorax. No significant change in appearance of the cardiomediastinal silhouette. Right upper quadrant TIPS stent. Unchanged indeterminate partially imaged radiodensity within the periventricular midline right upper abdomen.     Heterogeneous opacities predominantly within the left lung which are likely at least in part secondary to bronchovascular crowding from hypoventilation. However asymmetry raises suspicion for inflammation or infection.    Xr Chest Portable    Result Date: 10/10/2019  EXAM: XR CHEST PORTABLE DATE: 10/10/2019  10:51 AM ACCESSION: [id number] UN DICTATED: 10/10/2019 10:51 AM INTERPRETATION LOCATION: [facility] CLINICAL INDICATION: 64 years old Female with CHEST PAIN  COMPARISON: 10/07/2019 TECHNIQUE: Portable Chest Radiograph. FINDINGS: Unchanged right chest Port-A-Cath Lungs radiographically clear. No pleural effusion or pneumothorax. Stable cardiomediastinal silhouette. TIPS. Partially imaged radiodensity overlying the epigastrium, indeterminate     Clear lungs. Indeterminate radiodensity overlying the epigastrium.    Xr Chest Portable    Result Date: 10/07/2019  EXAM: XR CHEST PORTABLE DATE: 10/07/2019 ACCESSION: [id number] UN DICTATED: 10/07/2019 3:01 PM [facility]PRETATION LOCATION: [facility] CLINICAL INDICATION: 64 years old Female with ALTERED MENTAL STATUS  COMPARISON: 07/11/2019 TECHNIQUE: Portable Chest Radiograph. FINDINGS: Unchanged right Port-A-Cath. Mild bibasal atelectasis. No pleural effusion or pneumothorax Stable cardiomediastinal silhouette.     No acute airspace disease    [place] Tips Doppler    Result Date: 10/08/2019  EXAM: [place] TIPS DOPPLER DATE: 10/07/2019 8:52 PM ACCESSION: [id number] UN DICTATED: 10/07/2019 8:53 PM INTERPRETATION LOCATION: [facility] CLINICAL INDICATION: 64 years old Female with hx of TIPS p/w hepatic encephalopathy and GIB COMPARISON: MRI of the abdomen from 02/2019; TIPS ultrasound from 06/18/2019 TECHNIQUE:  Ultrasound views of the complete abdomen and liver vasculature were obtained using gray scale and color and spectral Doppler imaging. FINDINGS: HEPATOBILIARY: Left hepatic lobe mostly obscured by shadowing/bowel gas. The liver is heterogeneous with an echogenic echotexture. No focal hepatic lesions. No intrahepatic biliary ductal dilatation. The common bile duct is normal in caliber. The gallbladder is surgically absent. PANCREAS: Visualized portion is unremarkable. SPLEEN: Enlarged, measuring up to 18.8 cm. Normal echotexture. KIDNEYS: Normal in size and echotexture. No solid masses or calculi. No hydronephrosis. VESSELS: - Portal vein: The main portal vein is patent with hepatopetal flow. The left and right portal veins are not well visualized. - TIPS: The TIPS stent is patent. TIPS stent extends from the posterior right portal vein to the right hepatic vein.         Proximal TIPS velocity: 1.8-2.1, previously 2.51         Mid TIPS velocity: 2.3, previously 2.34         Distal TIPS velocity: 1.0, previously 0.76 - Splenic vein: Patent, with hepatopetal flow. - Hepatic veins/IVC: The IVC, left, middle and right hepatic veins are patent with bi/triphasic waveforms. - Hepatic artery: Patent - Visualized proximal aorta:  unremarkable OTHER: No ascites.     Patent TIPS with elevated velocities throughout the stent, indicative of mild stenosis. Focal color aliasing noted at the distal portion of the TIPS stent but without a significant step-off in measured velocities along the course of the stent, possibly mild or focal stenosis or occlusion. This overall appears slightly improved compared with previous examination. Hepatopedal flow within the main portal vein, as before. The right and left portal veins are again not well visualized . Small, heterogeneous liver consistent with cirrhosis. No sonographically detected hepatic lesions. Please note that MRI is more sensitive for detection of small hepatic lesions. Splenomegaly. Please see below for data measurements: Liver: 13.25 cm Common bile duct: 0.72 cm Right kidney: 11.36 cm Left kidney: 12.74 cm TIPS Proximal TIPS velocity: 2.11 m/s Mid TIPS velocity: 2.28 m/s Distal TIPS velocity: 1.06 m/s Main portal vein diameter: 1.27 cm Main portal vein velocity: 0.49 m/s Main portal vein flow: hepatopetal Right portal vein flow: Not well visualized Left portal vein flow: Not well visualized Common hepatic artery: Patent Left hepatic vein flow: triphasic Middle hepatic vein flow: triphasic Right hepatic vein flow: tips Inferior vena cava flow: biphasic Splenic vein midline:  hepatopetal Splenic vein proximal: hepatopetal Aorta: partially visualized Inferior vena cava: partially visualized Spleen: 18.83 cm Abdominal free fluid visualized: no      _____________________________________________________________________________  Discharge Instructions:   Activity Instructions     Activity as tolerated            Diet Instructions     Discharge diet (specify)      Discharge Nutrition Therapy: General          Other Instructions     Call MD for:  difficulty breathing, headache or visual disturbances      Call MD for:  extreme fatigue      Call MD for:  persistent dizziness or light-headedness      Call MD for:  persistent nausea or vomiting      Call MD for:  severe uncontrolled pain      Call MD for: Temperature > 38.5 Celsius ( > 101.3 Fahrenheit)      Discharge instructions      You were admitted to [facility] for a GI bleed. You underwent an EGD and colonoscopy with intervention to help stop the bleeding. Your hemoglobin levels have been stable and we believe you are safe to return home with close follow up. Please return to the ED if you have significant blood in your stool or vomit. You will be discharged on Bumex 2 mg daily and Spironolactone 100 mg daily for your diuretics. Please have labs obtained in 1 week at [facility].    1) Please take your medications as prescribed and note the following changes listed in your after visit summary 2) Please make all follow up appointments and be sure to take all your medications with you so your provider can better guide your care. 3) Seek medical care if you develop any changes in your mental status, worsening abdominal pain, fevers greater than 101.5, any unexplained/unrelieved shortness of breath, uncontrolled nausea and vomiting that keeps you from remaining hydrated or taking your medication, worsening swelling, or any other concerning symptoms. If following discharge from the hospital notice the development or worsening of any symptoms such as nausea, vomiting, chest pain, shortness of breath, fevers, or chills, please return to the emergency department. If you develop these symptoms, or if you have trouble obtaining any of your medications, you can contact the [facility] at [phone], or you can go to the [facility] at [facility] in [place]. You can also call the [facility] Link at [phone]. Please continue to follow-up with your outpatient care providers. Some of your follow-up appointments have been listed below. Please be sure to attend these appointments at the dates and times indicated. Your discharge medications are listed above. Please continue to take these medications as directed by the pharmacist. Note the following changes from your previous medication list.               Follow Up instructions and Outpatient Referrals     Ambulatory referral to Home Health      Is this a [place] or [place] Home Health referral?: No    Physician to follow patient's care: PCP    Disciplines requested:  Nursing  Physical Therapy  Occupational Therapy  Medical Social Work  Home Health Aide       Nursing requested: Teaching/skilled observation and assessment    What teaching is needed (new diagnosis? new medications?): see orders    Physical Therapy requested:  Home safety evaluation  Ambulation training  Transfer training  Strengthening exercises  Evaluate and treat       Occupational Therapy Requested:  Home safety evaluation  Strengthening exercises  Evaluate and treat  Transfer training       Medical Social Work requested: General area of patient need    General area of patient need: assistance with referrals    Requested start of care date: Routine (within 48 hours)    Ambulatory referral to Home Health      Is this a [employer] or [facility] Patient?: No    Physician to follow patient's care: PCP    Disciplines requested:  Nursing Comment - home health aide also  Physical Therapy  Occupational Therapy  Home Health Aide  Medical Social Work       Nursing requested: Teaching/skilled observation and assessment Comment - patient is covid recovered  Other: (please enter in comments)       What teaching is needed (new diagnosis? new medications?): med management and teaching, DM and hepatic encephalopathy management and teaching, VS assessment    Physical Therapy requested:  Ambulation training  Evaluate and treat  Transfer training  Weight bearing status       Weight Bearing Status (please provide detail): full weightbearing as tolerated    Occupational Therapy Requested:  Home safety evaluation  ADL or IADL training  Evaluate and treat  Weight bearing status       Weight Bearing Status (please provide detail): full weight bearing as tolerated    Medical Social Work requested: General area of patient need    General area of patient need: please assist with community resource referral for patient/family support; assist with SNF referral if home plan fails    Current community services in place: none    Requested start of care date: Routine (within 48 hours)    Special instructions: please note patient is covid recovered    Do you want ongoing co-management?: No    Care coordination required?: No    Call MD for:  difficulty breathing, headache or visual disturbances      Call MD for:  extreme fatigue      Call MD for:  persistent dizziness or light-headedness      Call MD for:  persistent nausea or vomiting      Call MD for:  severe uncontrolled pain      Call MD for: Temperature > 38.5 Celsius ( > 101.3 Fahrenheit)      Discharge instructions            Appointments which have been scheduled for you    Nov 03, 2019 10:00 AM  (Arrive by 9:30 AM)  RETURN  PSYCHOLOGY with Adalberto Cole, PhD  [facility] TRANSPLANT SURGERY [facility] REGION) [address]  [place] [address]  [phone]      Nov 03, 2019 12:00 PM  (Arrive by 11:30 AM)  RETURN  HEPATOLOGY with Melvern Sample, ANP  [facility] LIVER TRANSPLANT [facility] REGION) [address]  [place] [address]  [phone]      Nov 03, 2019  1:15 PM  (Arrive by 12:45 PM)  RETURN 15 with Chirag Lanney Gins, MD  [facility] TRANSPLANT SURGERY [facility] REGION) [address]  [place] [address]  [phone]      Dec 22, 2019 10:30 AM  (Arrive by 10:00 AM)  RETURN  HEPATOLOGY with Melonie Florida  Ruffin Frederick, MD  [facility] REGION) [address]  [place] [address]  [phone]

## 2019-10-24 DIAGNOSIS — E119 Type 2 diabetes mellitus without complications: Secondary | ICD-10-CM | POA: Diagnosis not present

## 2019-10-24 DIAGNOSIS — D508 Other iron deficiency anemias: Secondary | ICD-10-CM | POA: Diagnosis not present

## 2019-10-24 DIAGNOSIS — Z9981 Dependence on supplemental oxygen: Secondary | ICD-10-CM | POA: Diagnosis not present

## 2019-10-24 DIAGNOSIS — M6281 Muscle weakness (generalized): Secondary | ICD-10-CM | POA: Diagnosis not present

## 2019-10-24 DIAGNOSIS — K7581 Nonalcoholic steatohepatitis (NASH): Secondary | ICD-10-CM | POA: Diagnosis not present

## 2019-10-24 DIAGNOSIS — F418 Other specified anxiety disorders: Secondary | ICD-10-CM | POA: Diagnosis not present

## 2019-10-24 DIAGNOSIS — Z794 Long term (current) use of insulin: Secondary | ICD-10-CM | POA: Diagnosis not present

## 2019-10-24 DIAGNOSIS — G9349 Other encephalopathy: Secondary | ICD-10-CM | POA: Diagnosis not present

## 2019-10-27 ENCOUNTER — Encounter (HOSPITAL_COMMUNITY)
Admission: RE | Admit: 2019-10-27 | Discharge: 2019-10-27 | Disposition: A | Discharge status: Home / Self Care | Payer: PPO | Source: Ambulatory Visit | Attending: Internal Medicine | Admitting: Internal Medicine

## 2019-10-27 ENCOUNTER — Other Ambulatory Visit: Payer: Self-pay

## 2019-10-27 DIAGNOSIS — K7581 Nonalcoholic steatohepatitis (NASH): Secondary | ICD-10-CM | POA: Diagnosis not present

## 2019-10-27 LAB — SAMPLE TO BLOOD BANK

## 2019-10-27 LAB — HEMOGLOBIN AND HEMATOCRIT, BLOOD
HCT: 26.8 % — ABNORMAL LOW (ref 36.0–46.0)
Hemoglobin: 8 g/dL — ABNORMAL LOW (ref 12.0–15.0)

## 2019-10-27 MED ORDER — HEPARIN SOD (PORK) LOCK FLUSH 100 UNIT/ML IV SOLN
500.0000 [IU] | Freq: Once | INTRAVENOUS | Status: AC
  Administered 2019-10-27: 500 [IU] via INTRAVENOUS

## 2019-10-27 NOTE — Unmapped (Signed)
Reason for call: Assist coordinating paper work needed for renewal of MFR assistance for Xifaxan  ??  I called and spoke with pt's husband Casimiro Needle concerning proof of financial documentation needed for renewal of MFR assistance for Xifxan. Casimiro Needle stated they did receive the envelope in the mail and he has completed the information and will mail it in tomorrow. Notified pt we will be on the look out for the information in the mail and we will notify him and his wife once it has been approved. Pt thanked me for the call.     Vertell Limber RN, BSN  Nursing Care Coordinator   Pharmacy Adult GI Medicine  [facility]  [address]   [place], [place] [address]  [phone]

## 2019-10-28 DIAGNOSIS — Z6832 Body mass index (BMI) 32.0-32.9, adult: Secondary | ICD-10-CM | POA: Diagnosis not present

## 2019-10-28 DIAGNOSIS — K7290 Hepatic failure, unspecified without coma: Secondary | ICD-10-CM | POA: Diagnosis not present

## 2019-10-28 DIAGNOSIS — I10 Essential (primary) hypertension: Secondary | ICD-10-CM | POA: Diagnosis not present

## 2019-10-28 DIAGNOSIS — D696 Thrombocytopenia, unspecified: Secondary | ICD-10-CM | POA: Diagnosis not present

## 2019-10-28 DIAGNOSIS — F322 Major depressive disorder, single episode, severe without psychotic features: Secondary | ICD-10-CM | POA: Diagnosis not present

## 2019-10-28 DIAGNOSIS — Z299 Encounter for prophylactic measures, unspecified: Secondary | ICD-10-CM | POA: Diagnosis not present

## 2019-10-28 LAB — FSAB CLASS 2 ANTIBODY SPECIFICITY

## 2019-10-28 LAB — HLA CLASS 1 ANTIBODY

## 2019-10-28 LAB — HLA CL2 AB COMMENT: Lab: 0

## 2019-10-30 ENCOUNTER — Encounter (INDEPENDENT_AMBULATORY_CARE_PROVIDER_SITE_OTHER): Payer: PPO | Admitting: Ophthalmology

## 2019-10-30 NOTE — Unmapped (Signed)
Call placed to [facility] regarding changes in Marilyn French's appt 2/15.  Casimiro Needle agreed to changes.

## 2019-11-03 ENCOUNTER — Encounter (HOSPITAL_COMMUNITY): Admission: RE | Admit: 2019-11-03 | Payer: PPO | Source: Ambulatory Visit

## 2019-11-03 ENCOUNTER — Ambulatory Visit
Admit: 2019-11-03 | Discharge: 2019-11-04 | Payer: PRIVATE HEALTH INSURANCE | Attending: Nutritionist | Primary: Nutritionist

## 2019-11-03 ENCOUNTER — Ambulatory Visit: Admit: 2019-11-03 | Discharge: 2019-11-04 | Payer: PRIVATE HEALTH INSURANCE

## 2019-11-03 ENCOUNTER — Ambulatory Visit
Admit: 2019-11-03 | Discharge: 2019-11-04 | Payer: PRIVATE HEALTH INSURANCE | Attending: Health Service | Primary: Health Service

## 2019-11-03 DIAGNOSIS — K7460 Unspecified cirrhosis of liver: Principal | ICD-10-CM

## 2019-11-03 DIAGNOSIS — K7290 Hepatic failure, unspecified without coma: Principal | ICD-10-CM

## 2019-11-03 DIAGNOSIS — Z8659 Personal history of other mental and behavioral disorders: Secondary | ICD-10-CM | POA: Diagnosis not present

## 2019-11-03 DIAGNOSIS — F54 Psychological and behavioral factors associated with disorders or diseases classified elsewhere: Secondary | ICD-10-CM | POA: Diagnosis not present

## 2019-11-03 NOTE — Unmapped (Signed)
Outpatient Adult Nutrition-Transplant Evaluation Follow Up    Referring Provider: Ignatius Specking    Reason for Referral: Organ Transplant Evaluation  Liver Annual Follow up    PMH:   Patient Active Problem List   Diagnosis   ??? Abnormal echocardiogram   ??? NAFLD (nonalcoholic fatty liver disease)   ??? Cirrhosis (CMS-HCC)   ??? GAVE (gastric antral vascular ectasia)   ??? Hypothyroid   ??? Chronic blood loss anemia   ??? Generalized anxiety disorder   ??? Hepatic encephalopathy (CMS-HCC)   ??? Iron deficiency anemia   ??? Type 2 diabetes mellitus without complication, with long-term current use of insulin (CMS-HCC)   ??? Pulmonary hypertension (CMS-HCC)   ??? Aspiration into airway   ??? Thrombocytopenia (CMS-HCC)   cirrhosis 2/2 NAFLD    Anthropometrics:   Estimated body mass index is 29.52 kg/m?? as calculated from the following:    Height as of 10/17/19: 162.6 cm (5' 4).    Weight as of 10/17/19: 78 kg (172 lb).  IBW: 52.2 kg  %IBW: ~174%  ABW: 61.8 kg  Dry wt: same    Weight History: Pt was ~200 lbs during last transplant RD clinic visit in 2019, pt reports losing weight slowly since then as her fluid status has been under much better control.   Wt Readings from Last 10 Encounters:   10/17/19 78 kg (172 lb)   08/12/19 78.6 kg (173 lb 3.2 oz)   07/11/19 87.3 kg (192 lb 7.4 oz)   06/30/19 91.7 kg (202 lb 3.2 oz)   06/27/19 99.2 kg (218 lb 11.1 oz)   05/23/19 92.1 kg (203 lb)   04/04/19 83.9 kg (185 lb)   03/24/19 83.9 kg (185 lb)   02/28/19 93 kg (205 lb)   11/14/18 88.9 kg (196 lb)     Nutrition-Focused Physical Findings:   Unable to complete at this time due to limiting interactions between patient and provider during infectious outbreak in high risk population.    Relevant Medications, Herbs, Supplements include: Bumex, Vit D3, insulin, lactulose PRN (aims for 3-5 BMs per day), synthroid, mag-ox, protonix, Flinstone's complete, KLOR-CON, spironolactone    Relevant Labs: HbA1c 4.7     Physical Activity: Pt was doing PT while admitted to the hospital, and has started outpatient PT since coming home. Pt reports doing the exercises she has learned by herself between sessions     Dietary Restrictions, Intolerances: low sodium (2000 mg), consistent CHO. NKFA, occasional lactose intolerance.     Allergies:   Allergies   Allergen Reactions   ??? Ferumoxytol      Patient has severe back and chest pain when receiving FERAHEME infusions.   ??? Definity [Perflutren Lipid Microspheres] Muscle Pain     Pt experienced lower back spasm.    ??? Tramadol Hcl      WEAKNESS     Hunger and Satiety: fluctuates    24-Hour recall/usual intake:  1st - 8:30- 2 eggs or oatmeal + peanut butter   Snack - ice, lemon water  2nd - skips  Snack - n/a  3rd - frozen dinners (meat, vegetable, starch)  Snack - piece of fruit every once in a while  Beverages - Crystal Light, water (2L fluid restriction)    Nutrition History: pt endorses fluctuating appetite & intake but overall is much less than what she used to eat. She has lost a notable amount of weight in 1.5 years, which is likely not all related to fluid. No vomiting but occasional  nausea with certain foods. Reflux controlled with protonix. Occasional metallic taste changes. No greasy or oily BMs; typically has 3 per day with lactulose.      Social: accompanied by husband, uses cane     Estimated Needs:   Estimated Energy Needs: 1950- 2340 kcal/day (25- 30 kcal/kg  Estimated Protein Needs:  95- 115 g protein/day (1.2-1.5 g/kg)  Estimated CHO Needs: 208-243 g CHO/day (60 g per meal, 15 per snack)  Estimated Fluid Needs:  per MD    Nutrition Assessment: Per pt report and diet recall, intake is inadequate to meet nutrient needs and dietary behaviors are not appropriate for cirrhosis nutrition therapy. Pt participates in frequent meal skipping, which may be contributing to labile BGs and loss of muscle mass. She enjoys foods which are high in sodium, and husband has a difficult time grocery shopping for low sodium foods. Discussed the importance of eating regular meals and snacks (every ~2-3 hrs) and incorporating protein-rich food into each. Several suggestions provided.   Pt was able to complete frailty assessment again during visit, and she score improved greatly compared to 2019. She was able to complete all tasks without issue and did very well on the sit to stand activity. Pt reports she has been practicing PT exercises at home which she thinks has helped tremendously.     Nutrition Assessment for Transplant: The patient has the following nutrition concerns for transplant listing: none, pt with much improved frailty score. No absolute contraindication based on selection committee policy but will be discussed with the team.    Malnutrition Assessment using AND/ASPEN Clinical Characteristics:    Weight loss notable, but not considered nutritionally significant given length of time that the loss occurred.       [facility] Marita Snellen Index    Gender: Female  Score: 4.34, pre-frail, 8%ile    Dominant Hand Grip Strength:   Attempt 1: 13.9    Attempt 2: 14.1   Attempt 3: 14.0    Average: 14.0    Time to Do 5 Chair stands: 14.6 secs    Seconds Holding 3 positions:   Side by Side: 10   SemiTandem: 10   Tandem: 10    [email or web address]      Nutrition Education: transplant goals and expectations, cirrhosis nutrition therapy, low sodium nutrition therapy    Nutrition Goals:   1. Meet estimated daily needs.  2. Normal vitamin levels  3. Balanced macronutrient intake  4. Basic understanding of post-transplant diet  5. Maintain hemoglobin A1c <7%    Interventions:   1. Adhere to 2gm Na+, high protein diet  2. Eat every ~2-3 hrs  3. Pair protein w/ carbohydrates  --- greek yogurt, boiled eggs, peanut butter and graham crackers, almond butter w/ english muffin  4. Continue PT and practicing exercises    Materials Provided were: Sent pt MyChart message with low sodium tomato soup recipe from the [employer]   RD Contact Info    Follow-up: with yearly evaluation; more frequently PRN    Length of visit was: 29 minutes     Lanelle Bal, RD, LDN  Abdominal Transplant Dietitian   Pager: [phone]

## 2019-11-03 NOTE — Unmapped (Signed)
Transplant Surgery History and Physical      Assessment/Recommendations:    Marilyn French is a 64 y.o. female seen in consultation at the request of Sherryll Burger Vinie Sill, MD for evaluation of candidacy for transplantation.    I spent 45 minutes with the patient obtaining the above history and physical examination, and greater than 50% of the time was spent counseling and on the substance of the discussion.    Today we discussed liver transplantation going over the surgery to be performed, the hospital course including length of stay, anti-rejection medications and their side effects, results and the cadaveric donor system.    I discussed in detail with Marilyn French the risks and benefits of liver transplantation, including but not limited to: the general anesthetic, monitoring lines, the incision, the hepatectomy, as well as reimplantation of the liver graft and immunosuppressant medications. In regards to the surgical procedure, we noted that it is a major operation performed under general anesthesia with the risks of heart attack, stroke and death. Multiple invasive means of monitoring may be necessary during the operation including an arterial line, a central venous catheter, a foley catheter inserted into the bladder, and a tube from your nose into your stomach to prevent stomach distension. After surgery, the patient will go to the Intensive Care Unit and is then sent to the regular floor when medically stable. I discussed the possible complications including the need for reoperation for bleeding, infection or other complications, the possibility of clotting/leakage of blood vessels, requiring either radiological intervention, surgical intervention, or even retransplantation. I reviewed the possibility of complications involving the biliary tract including leaks, strictures and need for retransplantation for biliary complications. I discussed the possibility of primary nonfunction of the liver graft requiring urgent retransplantation or the result of death. The patient understands the need for long-term immunosuppression therapy as well as monitoring of labs and immunosuppression. Anti-rejection medications, including Prograf or Cyclosporine (Neoral), Cellcept, steroids and others, will be needed after transplantation and for the patient???s entire lifetime. Problems include infection, cancer, hirsutism, tremors, gum swelling, hypertension, bone fractures, aggravation of diabetes or new onset diabetes, cataracts, and rashes. Finally, the donor system was reviewed. All donors are tested for infections and other diseases, but there is a small chance of transmission of diseases including viruses as well as the possible transmission of tumors. Some patients may elect to receive a liver from a donor who was exposed to the Hepatitis B or Hepatitis C virus and the recipient may require certain anti-viral medications to prevent this virus from damaging the new liver.    Finally, I reviewed with the patient how the surgery is expected to improve their health and quality of life, that the average length of hospitalization stay is 10-12 days, and that the length of their expected recovery period, including when normal daily activities may be resumed, will be patient dependent.    Marilyn French had all their questions answered and wishes to proceed with the liver transplant evaluation process.    - CT Abdomen with contrast with portal venous phase to evaluate portal venous system patency.  - She has improved from before and needs to continue to do physical therapy and remain physically active.   Marilyn French assessment   - Dietician opinion    This patient was seen and evaluated with Dr. Celine Mans and needs a CT scan to assess her portal vasculature for patency.      HPI  Marilyn French??is a 64 y.o.??female??with NAFLD??cirrhosis s/p TIPS  procedure due to??blood loss??secondary??to portal hypertensive gastropathy/GAVE.??She has had multiple episodes of UGI Endoscopy and banding in the past for varices. She presented with massive blood loss due to variceal bleeding which led a Hb drop to 6, and did not respond to blood transfusion. Her cirrhosis is also complicated by ascites and PVT. PVT was diagnosed in Dec 2018. However, due to the bleeding she was not started on anticoagulation for PVT. TIPS was done on 07/08/2018 and patient has had no bleeding episodes since then. At the time of TIPS around 5.8 L of ascites was drained too. After TIPS placement, she has had few  episode of hepatic encephalopathy when she has been admitted. She does complain of melena.  She was recently admitted in January for weakness and was found to have low Hb. On evaluation she was found to have positive guaic test and underwent a colonoscopy which revealed three bleeding point in the colon which were coagulated using Argon. She still complains of dark colored stools.   She denies any abdominal pain, nausea, vomiting, hemetemesis, constipation, chest pain , SOB.        Allergies    Ferumoxytol, Definity [perflutren lipid microspheres], and Tramadol hcl      Medications      Current Outpatient Medications   Medication Sig Dispense Refill   ??? bumetanide (BUMEX) 2 MG tablet Take 1 tablet (2 mg total) by mouth daily. 30 tablet 1   ??? busPIRone (BUSPAR) 10 MG tablet Take 10 mg by mouth two (2) times a day.     ??? cholecalciferol, vitamin D3, (CHOLECALCIFEROL) 1,000 unit tablet Take 1,000 Units by mouth every other day.      ??? insulin ASPART (NOVOLOG FLEXPEN U-100 INSULIN) 100 unit/mL injection pen Use with given sliding scale    CBG 70-120: 0 units  CBG 121-150: 1 unit  CBG 151-200: 2 units  CBG 201-250: 3 units  CBG 251-300: 5 units  CBG 301-350: 7 units  CBG greater than 351: 9 units     ??? insulin glargine (LANTUS SOLOSTAR U-100 INSULIN) 100 unit/mL (3 mL) injection pen Inject 0.45 mL (45 Units total) under the skin daily. 1350 Units 0   ??? insulin lispro (HUMALOG) 100 unit/mL injection INJECT 15 TO 35 UNITS SUBCUTANEUOSLY PER SLIDING SCALE 3 TIMES DAILY     ??? lactulose (CHRONULAC) 10 gram/15 mL solution Take 20 g by mouth Four (4) times a day.      ??? levothyroxine (SYNTHROID, LEVOTHROID) 25 MCG tablet Take 25 mcg by mouth daily.     ??? lidocaine-prilocaine (EMLA) 2.5-2.5 % cream      ??? OXYGEN-AIR DELIVERY SYSTEMS MISC Inhale 2 L nightly.     ??? pantoprazole (PROTONIX) 40 MG tablet Take 40 mg by mouth Two (2) times a day.      ??? pediatric multivit-iron-min (FLINTSTONES COMPLETE) tablet Chew 2 tablets daily.      ??? pen needle, diabetic 31 gauge x 1/4 Ndle      ??? potassium chloride SA (K-DUR,KLOR-CON) 20 MEQ tablet Take 20 mEq by mouth daily.      ??? rifAXIMin (XIFAXAN) 550 mg Tab Take 1 tablet (550 mg total) by mouth Two (2) times a day. 60 tablet 11   ??? rosuvastatin (CRESTOR) 5 MG tablet Take 5 mg by mouth every other day.     ??? sertraline (ZOLOFT) 50 MG tablet Take 1 tablet (50 mg total) by mouth daily. 1 tablet (50mg ) by mouth daily. 30 tablet 1   ???  spironolactone (ALDACTONE) 100 MG tablet Take 1 tablet (100 mg total) by mouth daily. 30 tablet 11     No current facility-administered medications for this visit.     (Not in a hospital admission)        Past Medical History    Past Medical History:   Diagnosis Date   ??? Cirrhosis (CMS-HCC)    ??? Depression    ??? Diabetes mellitus (CMS-HCC)    ??? Fibromyalgia    ??? GAVE (gastric antral vascular ectasia)    ??? History of transfusion 04/2019    reports frequent blood tranfusions   ??? HTN (hypertension)    ??? Hypercholesterolemia    ??? Hypothyroid    ??? NAFLD (nonalcoholic fatty liver disease)    ??? Osteoporosis          Past Surgical History    Past Surgical History:   Procedure Laterality Date   ??? APPENDECTOMY     ??? CHOLECYSTECTOMY     ??? IR TIPS  07/08/2018    IR TIPS 07/08/2018 Soledad Gerlach, MD IMG VIR [facility]   ??? PR COLSC FLEXIBLE W/CONTROL BLEEDING ANY METHOD N/A 10/17/2019    Procedure: COLONOSCOPY, FLEXIBLE, PROXIMAL TO SPLENIC FLEXURE; DX, W/CONTROL OF BLEEDING;  Surgeon: Jules Husbands, MD;  [facility];  Service: Gastroenterology   ??? PR RIGHT HEART CATH O2 SATURATION & CARDIAC OUTPUT Right 01/18/2018    Procedure: Right Heart Catheterization;  Surgeon: Marlaine Hind, MD;  Location: [facility] CATH;  Service: Cardiology   ??? PR UPPER GI ENDOSCOPY,CTRL BLEED N/A 03/05/2019    Procedure: UGI ENDOSCOPY; WITH CONTROL OF BLEEDING, ANY METHOD;  Surgeon: Andrey Farmer, MD;  [facility]: [facility];  Service: Gastroenterology   ??? PR UPPER GI ENDOSCOPY,CTRL BLEED  04/04/2019    Procedure: UGI ENDOSCOPY; WITH CONTROL OF BLEEDING, ANY METHOD;  Surgeon: Janyth Pupa, MD;  [facility]: [facility];  Service: Gastroenterology   ??? PR UPPER GI ENDOSCOPY,CTRL BLEED N/A 05/23/2019    Procedure: UGI ENDOSCOPY; WITH CONTROL OF BLEEDING, ANY METHOD;  Surgeon: Janyth Pupa, MD;  Location: [facility];  Service: Gastroenterology   ??? PR UPPER GI ENDOSCOPY,CTRL BLEED N/A 06/13/2019    Procedure: UGI ENDOSCOPY; WITH CONTROL OF BLEEDING, ANY METHOD;  Surgeon: Annie Paras, MD;  [facility]: [facility];  Service: Gastroenterology   ??? PR UPPER GI ENDOSCOPY,CTRL BLEED N/A 07/11/2019    Procedure: UGI ENDOSCOPY; WITH CONTROL OF BLEEDING, ANY METHOD;  Surgeon: Annie Paras, MD;  [facility]: [facility];  Service: Gastroenterology   ??? PR UPPER GI ENDOSCOPY,CTRL BLEED N/A 09/16/2019    Procedure: UGI ENDOSCOPY; WITH CONTROL OF BLEEDING, ANY METHOD;  Surgeon: Beverly Milch, MD;  [facility]: [facility];  Service: Gastrointestinal   ??? PR UPPER GI ENDOSCOPY,CTRL BLEED N/A 10/07/2019    Procedure: UGI ENDOSCOPY; WITH CONTROL OF BLEEDING, ANY METHOD;  Surgeon: Pia Mau, MD;  [facility]: [facility];  Service: Gastroenterology   ??? PR UPPER GI ENDOSCOPY,DIAGNOSIS N/A 03/03/2019    Procedure: UGI ENDO, INCLUDE ESOPHAGUS, STOMACH, & DUODENUM &/OR JEJUNUM; DX W/WO COLLECTION SPECIMN, BY BRUSH OR WASH;  Surgeon: Luanne Bras, MD;  [facility];  Service: Gastroenterology   ??? SALPINGOOPHORECTOMY           Family History    The patient's family history is not on file..      Social History:  Tobacco use: denies  Alcohol use: denies  Drug use: denies      Review of Systems    A 12 system review of systems was negative except as noted in HPI    Objective     PE: Blood pressure 134/48, pulse 78, temperature 36.9 ??C (98.4 ??F), temperature source Tympanic, height 162.6 cm (5' 4), weight 80.5 kg (177 lb 6.4 oz). Body mass index is 30.45 kg/m??.  General: alert and oriented  Lungs: clear to auscultation, percussion to the bases, and unlabored breathing  Heart: euvolemic, regular rate and rhythm, normal S1 and S2, no murmur  Abd: soft, non-distended, non-tender, no organomegaly or masses, right subcostal scar  Ascites: none   Skin: jaundice  Ext: edema  Neuro: non-focal exam. thought organized, appropriate affect, normal fluent speech        Test Results    Labs:  All lab results last 24 hours:  No results found for this or any previous visit (from the past 24 hour(s)).    Imaging:

## 2019-11-04 ENCOUNTER — Other Ambulatory Visit (INDEPENDENT_AMBULATORY_CARE_PROVIDER_SITE_OTHER): Payer: Self-pay | Admitting: Internal Medicine

## 2019-11-04 ENCOUNTER — Other Ambulatory Visit: Payer: Self-pay

## 2019-11-04 ENCOUNTER — Encounter (HOSPITAL_COMMUNITY)
Admission: RE | Admit: 2019-11-04 | Discharge: 2019-11-04 | Disposition: A | Discharge status: Home / Self Care | Payer: PPO | Source: Ambulatory Visit | Attending: Internal Medicine | Admitting: Internal Medicine

## 2019-11-04 DIAGNOSIS — R600 Localized edema: Secondary | ICD-10-CM

## 2019-11-04 DIAGNOSIS — K7581 Nonalcoholic steatohepatitis (NASH): Secondary | ICD-10-CM | POA: Diagnosis not present

## 2019-11-04 LAB — BASIC METABOLIC PANEL
Anion gap: 8 (ref 5–15)
BUN: 20 mg/dL (ref 8–23)
CO2: 26 mmol/L (ref 22–32)
Calcium: 8.7 mg/dL — ABNORMAL LOW (ref 8.9–10.3)
Chloride: 105 mmol/L (ref 98–111)
Creatinine, Ser: 1.03 mg/dL — ABNORMAL HIGH (ref 0.44–1.00)
GFR calc Af Amer: >60 mL/min (ref >60)
GFR calc non Af Amer: 57 mL/min — ABNORMAL LOW (ref >60)
Glucose, Bld: 107 mg/dL — ABNORMAL HIGH (ref 70–99)
Potassium: 3.5 mmol/L (ref 3.5–5.1)
Sodium: 139 mmol/L (ref 135–145)

## 2019-11-04 LAB — HEMOGLOBIN AND HEMATOCRIT, BLOOD
HCT: 25.9 % — ABNORMAL LOW (ref 36.0–46.0)
Hemoglobin: 7.8 g/dL — ABNORMAL LOW (ref 12.0–15.0)

## 2019-11-04 LAB — SAMPLE TO BLOOD BANK

## 2019-11-04 NOTE — Unmapped (Signed)
Call placed to [facility] to make her aware of the CT scheduled for her.  Casimiro Needle answered and verbally agreed to the appt.  NPO restrictions were also given to Casimiro Needle.

## 2019-11-05 ENCOUNTER — Encounter (INDEPENDENT_AMBULATORY_CARE_PROVIDER_SITE_OTHER): Payer: PPO | Admitting: Ophthalmology

## 2019-11-05 DIAGNOSIS — E11311 Type 2 diabetes mellitus with unspecified diabetic retinopathy with macular edema: Secondary | ICD-10-CM | POA: Diagnosis not present

## 2019-11-05 DIAGNOSIS — I10 Essential (primary) hypertension: Secondary | ICD-10-CM | POA: Diagnosis not present

## 2019-11-05 DIAGNOSIS — H43813 Vitreous degeneration, bilateral: Secondary | ICD-10-CM

## 2019-11-05 DIAGNOSIS — F418 Other specified anxiety disorders: Secondary | ICD-10-CM | POA: Diagnosis not present

## 2019-11-05 DIAGNOSIS — H35033 Hypertensive retinopathy, bilateral: Secondary | ICD-10-CM | POA: Diagnosis not present

## 2019-11-05 DIAGNOSIS — E113291 Type 2 diabetes mellitus with mild nonproliferative diabetic retinopathy without macular edema, right eye: Secondary | ICD-10-CM | POA: Diagnosis not present

## 2019-11-05 DIAGNOSIS — K7581 Nonalcoholic steatohepatitis (NASH): Secondary | ICD-10-CM | POA: Diagnosis not present

## 2019-11-05 DIAGNOSIS — E119 Type 2 diabetes mellitus without complications: Secondary | ICD-10-CM | POA: Diagnosis not present

## 2019-11-05 DIAGNOSIS — E113312 Type 2 diabetes mellitus with moderate nonproliferative diabetic retinopathy with macular edema, left eye: Secondary | ICD-10-CM | POA: Diagnosis not present

## 2019-11-05 DIAGNOSIS — D508 Other iron deficiency anemias: Secondary | ICD-10-CM | POA: Diagnosis not present

## 2019-11-07 ENCOUNTER — Ambulatory Visit: Admit: 2019-11-07 | Discharge: 2019-11-08 | Payer: PRIVATE HEALTH INSURANCE

## 2019-11-07 DIAGNOSIS — K7290 Hepatic failure, unspecified without coma: Principal | ICD-10-CM

## 2019-11-07 DIAGNOSIS — K7469 Other cirrhosis of liver: Secondary | ICD-10-CM | POA: Diagnosis not present

## 2019-11-07 DIAGNOSIS — K766 Portal hypertension: Secondary | ICD-10-CM | POA: Diagnosis not present

## 2019-11-07 DIAGNOSIS — R161 Splenomegaly, not elsewhere classified: Secondary | ICD-10-CM | POA: Diagnosis not present

## 2019-11-07 NOTE — Unmapped (Signed)
On call page received from [name] - return call placed and spoke with Hildred Priest who had questions re: NPO status concerning CT abd scheduled for later today. Provided him with contact # for CT department and advised him to call them directly for the most accurate information.

## 2019-11-10 ENCOUNTER — Encounter (HOSPITAL_COMMUNITY)
Admission: RE | Admit: 2019-11-10 | Discharge: 2019-11-10 | Disposition: A | Discharge status: Home / Self Care | Payer: PPO | Source: Ambulatory Visit | Attending: Internal Medicine | Admitting: Internal Medicine

## 2019-11-10 ENCOUNTER — Other Ambulatory Visit (INDEPENDENT_AMBULATORY_CARE_PROVIDER_SITE_OTHER): Payer: Self-pay | Admitting: Internal Medicine

## 2019-11-10 ENCOUNTER — Other Ambulatory Visit: Payer: Self-pay

## 2019-11-10 DIAGNOSIS — R600 Localized edema: Secondary | ICD-10-CM

## 2019-11-10 DIAGNOSIS — K7581 Nonalcoholic steatohepatitis (NASH): Secondary | ICD-10-CM | POA: Diagnosis not present

## 2019-11-10 LAB — SAMPLE TO BLOOD BANK

## 2019-11-10 LAB — HEMOGLOBIN AND HEMATOCRIT, BLOOD
HCT: 26.3 % — ABNORMAL LOW (ref 36.0–46.0)
Hemoglobin: 7.9 g/dL — ABNORMAL LOW (ref 12.0–15.0)

## 2019-11-10 MED ORDER — HEPARIN SOD (PORK) LOCK FLUSH 100 UNIT/ML IV SOLN
500.0000 [IU] | Freq: Once | INTRAVENOUS | Status: AC
  Administered 2019-11-10: 11:00:00 500 [IU] via INTRAVENOUS

## 2019-11-17 ENCOUNTER — Other Ambulatory Visit: Payer: Self-pay

## 2019-11-17 ENCOUNTER — Other Ambulatory Visit (HOSPITAL_COMMUNITY): Payer: PPO

## 2019-11-17 ENCOUNTER — Encounter (HOSPITAL_COMMUNITY): Payer: Self-pay

## 2019-11-17 ENCOUNTER — Encounter (HOSPITAL_COMMUNITY)
Admission: RE | Admit: 2019-11-17 | Discharge: 2019-11-17 | Disposition: A | Discharge status: Home / Self Care | Payer: PPO | Source: Ambulatory Visit | Attending: Internal Medicine | Admitting: Internal Medicine

## 2019-11-17 DIAGNOSIS — K7581 Nonalcoholic steatohepatitis (NASH): Secondary | ICD-10-CM | POA: Insufficient documentation

## 2019-11-17 LAB — HEMOGLOBIN AND HEMATOCRIT, BLOOD
HCT: 24.9 % — ABNORMAL LOW (ref 36.0–46.0)
Hemoglobin: 7.3 g/dL — ABNORMAL LOW (ref 12.0–15.0)

## 2019-11-17 LAB — SAMPLE TO BLOOD BANK

## 2019-11-17 MED ORDER — HEPARIN SOD (PORK) LOCK FLUSH 100 UNIT/ML IV SOLN
500.0000 [IU] | Freq: Once | INTRAVENOUS | Status: AC
  Administered 2019-11-17: 11:00:00 500 [IU] via INTRAVENOUS

## 2019-11-18 NOTE — Unmapped (Signed)
CONFIDENTIAL PSYCHOLOGICAL NOTE/FOLLOW-UP    Patient Name: Marilyn French  Medical Record Number: [id number]  Date of Service: November 03, 2019  Clinical Psychologist: Robert Bellow, Ph.D.  Intern: none  Evaluation Duration and Procedures: 60 minute clinical interview; record review; case consultation    This evaluation note may contain sensitive and confidential information regarding the patient???s psychosocial adjustment to living with a chronic medical condition. DO NOT share this information outside [facility] without written consent from the patient explicitly stating that mental health records may be released.     The limits of confidentiality and the purpose of the evaluation were reviewed. The patient was provided with a verbal description of the nature and purpose of the psychological evaluation. I also reviewed the referral source, specific referral question for this evaluation, foreseeable risks/discomforts, benefits, limits of confidentiality, and mandatory reporting requirements of this provider. The patient was given the opportunity to ask questions and receive answers about the present evaluation. Oral consent was provided by the patient.     BACKGROUND INFORMATION/RECORD REVIEW:  Marilyn French was seen for a follow-up psychological evaluation as one part of a comprehensive assessment for liver transplantation and for treatment planning. She is a 64 y.o. married Caucasian female from [place], [place]. She has been diagnosed with ESLD 2/2 NAFLD. She was first seen for psychological evaluation by Dr. Venia Carbon on 01/02/18, and recommended to start MH counseling, get a psychiatry consult, and improve adherence. She was seen for follow-up by this writer on 11/14/18, and found to be a marginal candidate with recommendation to continue weekly therapy with established psychologist and follow-up with psychiatry, to improve MH symptoms, and increase husband involvement to improve adherence to diet and medications. She was scheduled to see this writer again in 02/24/2019 but was unable to participate due to HE and disease symptoms.     BEHAVIORAL OBSERVATIONS:  Marilyn French arrived for her appointment  on time.  She was interviewed with her husband present.  Rapport was easily established.  She did not seem motivated to present  herself in an overly favorable light    MENTAL STATUS EXAM:  Appearance: Appears stated age and Clean/Neat  Motor: No abnormal movements  Speech/Language: Normal rate, volume, tone, fluency  Mood: good  Affect: Calm, Cooperative and Euthymic  Thought Process: Logical, linear, clear, coherent, goal directed  Thought Content: Denies SI, HI, self harm, delusions, obsessions, paranoid ideation, or ideas of reference  Perceptual Disturbances: Denies auditory and visual hallucinations, behavior not concerning for response to internal stimuli  Orientation: Oriented to person, place, time, and general circumstances  Attention: Able to fully attend without fluctuations in consciousness  Concentration: Able to fully concentrate and attend  Memory: Immediate, short-term, long-term, and recall grossly intact  Fund of Knowledge: Consistent with level of education and development  Insight: Intact  Judgment: Intact  Impulse Control: Intact    HEALTH/MEDICAL:  Interval Changes: Pt was hospitalized from 1/17-10/21/19 for an upper GI bleed and worsening HE. Per chart, her latest MELD was 17 on 10/27/19.Since her TIPS, she noted that her ammonia can rise quickly (versus gradually in the past) which scares her when she has rapid onset HE. Pt also had a hospitalization at [facility] in December for high ammonia levels; pt lost a week and does not recall any of that time which was upsetting.  Current Symptoms: stomach pains, nausea in AM, HE about 1x/week, fluid levels are better controlled    Medications:   Current Outpatient Medications   Medication  Sig Dispense Refill   ??? bumetanide (BUMEX) 2 MG tablet Take 1 tablet (2 mg total) by mouth daily. 30 tablet 1   ??? busPIRone (BUSPAR) 10 MG tablet Take 10 mg by mouth two (2) times a day.     ??? cholecalciferol, vitamin D3, (CHOLECALCIFEROL) 1,000 unit tablet Take 1,000 Units by mouth every other day.      ??? insulin ASPART (NOVOLOG FLEXPEN U-100 INSULIN) 100 unit/mL injection pen Use with given sliding scale    CBG 70-120: 0 units  CBG 121-150: 1 unit  CBG 151-200: 2 units  CBG 201-250: 3 units  CBG 251-300: 5 units  CBG 301-350: 7 units  CBG greater than 351: 9 units     ??? insulin glargine (LANTUS SOLOSTAR U-100 INSULIN) 100 unit/mL (3 mL) injection pen Inject 0.45 mL (45 Units total) under the skin daily. 1350 Units 0   ??? insulin lispro (HUMALOG) 100 unit/mL injection INJECT 15 TO 35 UNITS SUBCUTANEUOSLY PER SLIDING SCALE 3 TIMES DAILY     ??? lactulose (CHRONULAC) 10 gram/15 mL solution Take 20 g by mouth Four (4) times a day.      ??? levothyroxine (SYNTHROID, LEVOTHROID) 25 MCG tablet Take 25 mcg by mouth daily.     ??? lidocaine-prilocaine (EMLA) 2.5-2.5 % cream      ??? OXYGEN-AIR DELIVERY SYSTEMS MISC Inhale 2 L nightly.     ??? pantoprazole (PROTONIX) 40 MG tablet Take 40 mg by mouth Two (2) times a day.      ??? pediatric multivit-iron-min (FLINTSTONES COMPLETE) tablet Chew 2 tablets daily.      ??? pen needle, diabetic 31 gauge x 1/4 Ndle      ??? potassium chloride SA (K-DUR,KLOR-CON) 20 MEQ tablet Take 20 mEq by mouth daily.      ??? rifAXIMin (XIFAXAN) 550 mg Tab Take 1 tablet (550 mg total) by mouth Two (2) times a day. 60 tablet 11   ??? rosuvastatin (CRESTOR) 5 MG tablet Take 5 mg by mouth every other day.     ??? sertraline (ZOLOFT) 50 MG tablet Take 1 tablet (50 mg total) by mouth daily. 1 tablet (50mg ) by mouth daily. 30 tablet 1   ??? spironolactone (ALDACTONE) 100 MG tablet Take 1 tablet (100 mg total) by mouth daily. 30 tablet 11     No current facility-administered medications for this visit.        ADHERENCE ISSUES:  Diet Adherence: Fair, pt has a low appetite, working to eat like it's a job, told to watch protein due to kidneys. Pt's weight is down to 177, after being above 200lb; she noted feeling better.   Medication Management: Pt depends on her husband for adherence due to HE. He has increased involvement, he primarily fills her pill container; husband is much more aware of her medications and checks behind. Pt helps fill pill box sometimes. Pt's husband watches as she takes lactulose and insulin. IF she gets confused, he steps in to help. We discussed importance of even close watch, as they know pt does not always have awareness of when HE symptoms occur so she may not be aware to ask for help. Pt stated,  I know I'm not getting things, I'm working on accepting it  Medication Adherence: Good  Medication Concerns: Endorsed stomach issues with lactulose and some medications, but pt reported she has accepted this and eating with meds helps; denied problems taking medications, affordability, problems obtaining medications, and difficulty remembering medications  *Pt's daughter has set up  home health supports, including PT and OT once per week. Pt is up daily, able to dress and bathe by herself.  Attendance to appointments: Good , Improved, pt's husband attends all appointments with her.   Health Literacy Estimation:  Good     Dialysis: N/A  Diabetes: Good. Latest A1c was 4.7% on 07/12/19. Pt checks BS herself still and continues to draw/administer her own insulin, but her husband watches her check BS and watches her insulin dosage. He knows her sliding scale. We discussed more consistency of pt's husband having eyes on her BG meter and associated insulin, due to her rapidly changing HE symptoms that can impair independence in diabetes management. They both understood the potential consequence for mistakes on insulin administration, and agreed for him to be even more closely involved.  Sleep Apnea: N/A      SUBSTANCE USE ISSUES:   No change, no concerns    TRANSPLANT ISSUES:  Interval changes since last evaluation: Yes.  Pt still wants transplant and reported changes to her caregiving plan: primary/husband/Mike, and now her daughter/Traci will be first back up for support since she lives in [place] and understands pt's needs. Pt's sister/Mildred will provide additional support as needed. Pt reported having reduced financial anxiety re: post-transplant needs since talking with her insurance agent about supplemental plans. Also, she turns 64 in August so she will be eligible for Medicare.     MENTAL HEALTH:   Mental health changes since last evaluation: Pt felt frustrated and disheartened when Palliative Care was sent to talk with her during her last admission. She felt that meant she had no hope, when she still wants to live. Pt is followed by [facility] with next appointment scheduled for 01/06/20. She continues to take Zoloft, and was increased to 100mg ; continues on buspar 10mg . Pt reported she sometimes feels the medications take out my personality but she and her husband agree they help with her mood. Pt stopped seeing Dr. Alvan Dame last year when her liver symptoms and HE prevented counseling from being helpful. Pt also stated that she did not feel it was a good fit, since it was primarily supportive and pt felt it was not active in doing things between sessions. Psychoeducation on CBT was provided and pt is amenable to finding another counselor for MH therapy to cope with disease effects.  Mood: good but recently frustrated and disheartened   Depressive Disorder Symptoms: some days of depressed mood, change in appetite or weight, fatigue/loss of energy, problems concentrating/making decisions; symptoms crossover with disease-related symptoms. Pt denied low motivation/anhedonia, reported she feels limited physically in what she can do.  Bipolar Disorder: Denied  Cognitive Symptoms: Yes, HE symptoms about 1x/week,     Anxiety:  Worry: Yes - appropriate health-related worry. Reported much reduced financial worry since talking with an insurance agent.  GAD: Yes h/o; denied significant anxiety at present - managed by buspar  Panic: no  Agoraphobia: No  Phobias: No    PTSD: No  OCD: no  Other DSM-V Diagnoses: no  Coping Style: Functional. While pt remains limited in access to prior coping tools, she reported she has received support through facebook, time with her grandchildren, and spending time with her dog. She remains interested in more active therapy to improve coping skills.    SOCIAL ISSUES:   Interval changes since last evaluation: No    INTERVENTION:  Health and behavior assessment, psychoeducation, treatment planning    PSYCHOLOGICAL TESTING:  None    PSYCHIATRIC  DIAGNOSIS:  H/o Generalized Anxiety Disorder, Unspecified Depressive Disorder    COLLATERAL INFORMATION:  Husband, present during visit    IMPRESSIONS AND RECOMMENDATIONS:    Ms. Senegal was seen today for pre-transplant follow up. She was last seen by this writer on 11/14/18 and her husband was seen for follow-up on 02/24/19 for follow-up when pt was unable to participate in the visit due to HE. She and her husband were seen in person today in clinic, and pt was alert and able to fully participate. She reported much improved adherence to her medical directives, with increased involvement from her husband to watch medications being taken. He demonstrated improved knowledge of her medical regimen. We discussed additional strategies for him to be more involved to maintain assurance that pt is accurate in BS reading and insulin dosing. Ms. Tsukamoto expressed more acceptance of the effects of her liver disease on her ability to manage independently, and has been able to accept his help. She denied substance issues. She reported motivation for transplant, realistic expectations, and good understanding of the process. Her social support seems good, with new back up support from her daughter who lives near the hospital and has helped pt and husband get more organized for her medical needs. Her psychological functioning appears fairly stable, with some ongoing mild depressive and anxious symptoms, though better treated and managed through medications. Pt continues to follow with [facility]; pt continues to take Zoloft, and was increased to 100mg ; continues on buspar 10mg . She felt recently discouraged when palliative care was consulted during a hospital stay, yet she and her husband maintain hope that she will get listed for transplant. Pt discontinued therapy with former provider earlier in 2020 when she was unable to participate with increasing HE; she reported that was not a good fit but she is interested in finding a new local counselor to work on more active coping skills. Resources were provided and pt will call this Clinical research associate when she has established care.     There are no apparent psychological contraindications to transplant, though team will need to closely monitor mental health status and coping, adherence, and any changes in social support.    We have few concerns about Ms. Totman's candidacy on the transplant list. To optimize her Ms. Tomkinson's outcomes while listed and after transplant, the following is recommended:     1. Pt must maintain consistent adherence to medications, diabetes management, diet, and appointment attendance. We discussed additional strategies today for even more support her husband to ensure adherence given pt's HE.  2. Pt encouraged to maintain follow-up with [facility] (next appointment scheduled for 01/06/20), and to continue psychiatric medications, to not stop or change without discussing with the associated prescribing providers first.  3. Encouraged pt to start MH therapy again with a new provider, per pt choice, to support management of anxious and depressive symptoms and overall coping with her illness. Resources and how to find counselors was provided and pt will confirm with this Clinical research associate when she begins care. Given current medication management and improved adherence, this is recommended but not required for listing at this time.  4. Encouraged pt and husband to monitor mood and any changes in impairment or functioning.   5. Recommend annual Transplant Psychology follow-up, or sooner as needed per team or patient request.    Final decision regarding listing status is based upon committee review at selection meeting.     Recommendations discussed with patient?  yes  Agreed upon by patient?  yes     Ms.  Gamboa was given this writer's confidential voice mail number and instructed to call 911 for emergencies.

## 2019-11-24 ENCOUNTER — Other Ambulatory Visit: Payer: Self-pay

## 2019-11-24 ENCOUNTER — Encounter (HOSPITAL_COMMUNITY)
Admission: RE | Admit: 2019-11-24 | Discharge: 2019-11-24 | Disposition: A | Discharge status: Home / Self Care | Payer: PPO | Source: Ambulatory Visit | Attending: Internal Medicine | Admitting: Internal Medicine

## 2019-11-24 DIAGNOSIS — K7581 Nonalcoholic steatohepatitis (NASH): Secondary | ICD-10-CM | POA: Diagnosis not present

## 2019-11-24 LAB — HEMOGLOBIN AND HEMATOCRIT, BLOOD
HCT: 26.4 % — ABNORMAL LOW (ref 36.0–46.0)
Hemoglobin: 7.6 g/dL — ABNORMAL LOW (ref 12.0–15.0)

## 2019-11-24 LAB — SAMPLE TO BLOOD BANK

## 2019-11-24 MED ORDER — HEPARIN SOD (PORK) LOCK FLUSH 100 UNIT/ML IV SOLN
500.0000 [IU] | Freq: Once | INTRAVENOUS | Status: AC
  Administered 2019-11-24: 13:00:00 500 [IU] via INTRAVENOUS

## 2019-11-26 DIAGNOSIS — G9349 Other encephalopathy: Secondary | ICD-10-CM | POA: Diagnosis not present

## 2019-11-26 DIAGNOSIS — F418 Other specified anxiety disorders: Secondary | ICD-10-CM | POA: Diagnosis not present

## 2019-11-26 DIAGNOSIS — Z794 Long term (current) use of insulin: Secondary | ICD-10-CM | POA: Diagnosis not present

## 2019-11-26 DIAGNOSIS — Z9981 Dependence on supplemental oxygen: Secondary | ICD-10-CM | POA: Diagnosis not present

## 2019-11-26 DIAGNOSIS — D508 Other iron deficiency anemias: Secondary | ICD-10-CM | POA: Diagnosis not present

## 2019-11-26 DIAGNOSIS — M6281 Muscle weakness (generalized): Secondary | ICD-10-CM | POA: Diagnosis not present

## 2019-11-26 DIAGNOSIS — E119 Type 2 diabetes mellitus without complications: Secondary | ICD-10-CM | POA: Diagnosis not present

## 2019-11-26 DIAGNOSIS — K7581 Nonalcoholic steatohepatitis (NASH): Secondary | ICD-10-CM | POA: Diagnosis not present

## 2019-12-01 ENCOUNTER — Emergency Department (HOSPITAL_COMMUNITY)
Admission: EM | Admit: 2019-12-01 | Discharge: 2019-12-01 | Disposition: A | Discharge status: Home / Self Care | Payer: PPO | Attending: Emergency Medicine | Admitting: Emergency Medicine

## 2019-12-01 ENCOUNTER — Other Ambulatory Visit: Payer: Self-pay

## 2019-12-01 ENCOUNTER — Encounter (HOSPITAL_COMMUNITY): Payer: Self-pay

## 2019-12-01 ENCOUNTER — Encounter (HOSPITAL_COMMUNITY)
Admission: RE | Admit: 2019-12-01 | Discharge: 2019-12-01 | Disposition: A | Discharge status: Home / Self Care | Payer: PPO | Source: Ambulatory Visit | Attending: Internal Medicine | Admitting: Internal Medicine

## 2019-12-01 DIAGNOSIS — E039 Hypothyroidism, unspecified: Secondary | ICD-10-CM | POA: Diagnosis not present

## 2019-12-01 DIAGNOSIS — I5032 Chronic diastolic (congestive) heart failure: Secondary | ICD-10-CM | POA: Insufficient documentation

## 2019-12-01 DIAGNOSIS — I2510 Atherosclerotic heart disease of native coronary artery without angina pectoris: Secondary | ICD-10-CM | POA: Diagnosis not present

## 2019-12-01 DIAGNOSIS — D649 Anemia, unspecified: Secondary | ICD-10-CM

## 2019-12-01 DIAGNOSIS — K922 Gastrointestinal hemorrhage, unspecified: Secondary | ICD-10-CM | POA: Insufficient documentation

## 2019-12-01 DIAGNOSIS — Z79899 Other long term (current) drug therapy: Secondary | ICD-10-CM | POA: Insufficient documentation

## 2019-12-01 DIAGNOSIS — Z794 Long term (current) use of insulin: Secondary | ICD-10-CM | POA: Diagnosis not present

## 2019-12-01 DIAGNOSIS — I110 Hypertensive heart disease with heart failure: Secondary | ICD-10-CM | POA: Diagnosis not present

## 2019-12-01 DIAGNOSIS — E119 Type 2 diabetes mellitus without complications: Secondary | ICD-10-CM | POA: Diagnosis not present

## 2019-12-01 LAB — CBC
HCT: 21.6 % — ABNORMAL LOW (ref 36.0–46.0)
Hemoglobin: 5.9 g/dL — CL (ref 12.0–15.0)
MCH: 25.4 pg — ABNORMAL LOW (ref 26.0–34.0)
MCHC: 27.3 g/dL — ABNORMAL LOW (ref 30.0–36.0)
MCV: 93.1 fL (ref 80.0–100.0)
Platelets: 38 10*3/uL — ABNORMAL LOW (ref 150–400)
RBC: 2.32 MIL/uL — ABNORMAL LOW (ref 3.87–5.11)
RDW: 18.4 % — ABNORMAL HIGH (ref 11.5–15.5)
WBC: 2 10*3/uL — ABNORMAL LOW (ref 4.0–10.5)
nRBC: 0 % (ref 0.0–0.2)

## 2019-12-01 LAB — SAMPLE TO BLOOD BANK

## 2019-12-01 LAB — BASIC METABOLIC PANEL
Anion gap: 7 (ref 5–15)
BUN: 29 mg/dL — ABNORMAL HIGH (ref 8–23)
CO2: 20 mmol/L — ABNORMAL LOW (ref 22–32)
Calcium: 8.1 mg/dL — ABNORMAL LOW (ref 8.9–10.3)
Chloride: 108 mmol/L (ref 98–111)
Creatinine, Ser: 1.01 mg/dL — ABNORMAL HIGH (ref 0.44–1.00)
GFR calc Af Amer: >60 mL/min (ref >60)
GFR calc non Af Amer: 59 mL/min — ABNORMAL LOW (ref >60)
Glucose, Bld: 152 mg/dL — ABNORMAL HIGH (ref 70–99)
Potassium: 3.9 mmol/L (ref 3.5–5.1)
Sodium: 135 mmol/L (ref 135–145)

## 2019-12-01 LAB — HEMOGLOBIN AND HEMATOCRIT, BLOOD
HCT: 20.6 % — ABNORMAL LOW (ref 36.0–46.0)
Hemoglobin: 5.8 g/dL — CL (ref 12.0–15.0)

## 2019-12-01 LAB — PREPARE RBC (CROSSMATCH)

## 2019-12-01 MED ORDER — HEPARIN SOD (PORK) LOCK FLUSH 100 UNIT/ML IV SOLN
500.0000 [IU] | Freq: Once | INTRAVENOUS | Status: AC
  Administered 2019-12-01: 500 [IU] via INTRAVENOUS

## 2019-12-01 MED ORDER — SODIUM CHLORIDE 0.9% IV SOLUTION: Freq: Once | INTRAVENOUS | Status: DC

## 2019-12-01 MED ORDER — HEPARIN SOD (PORK) LOCK FLUSH 100 UNIT/ML IV SOLN
500.0000 [IU] | Freq: Once | INTRAVENOUS | Status: AC
  Administered 2019-12-01: 500 [IU]
  Filled 2019-12-01: qty 5

## 2019-12-01 NOTE — Discharge Instructions (Signed)
Please have your blood levels checked within 1 week, emergency department for severe or worsening lightheadedness shortness of breath weakness or visible ongoing or heavy bleeding

## 2019-12-01 NOTE — ED Notes (Signed)
Date and time results received: 12/01/19 1651   Test: H emoglobin  Critical Value: 5.9  Name of Provider Notified: Dr. Sabra Heck   Orders Received? Or Actions Taken?: No new orders given.

## 2019-12-01 NOTE — ED Triage Notes (Signed)
Pt was sent to ED by [name] for abnormal hemoglobin 5.8. Pt denies any rectal bleeding.

## 2019-12-01 NOTE — ED Provider Notes (Signed)
[facility] Provider Note   CSN: [id number] Arrival date & time: 12/01/19  1429     History Chief Complaint  Patient presents with  . Abnormal Lab    Maria [name] is a 64 y.o. female.  HPI   This patient is a 64 year old female, she has a known history of congestive heart failure, unfortunately she has nonalcoholic fatty liver disease which has caused her to have end-stage cirrhosis.  She has had some esophageal bleeding which was nonvariceal and has recently had an admission to an outside hospital, at the [facility] on 10/05/2019.  She was seen in follow-up by her gastroenterologist here, Dr. Melony Overly who recommended that she come to the hospital because of anemia of 5.8 which is unfortunately too low to allow an outpatient transfusion.  The patient denies seeing blood in her stools, she has had some constipation despite taking lactulose multiple times per day.  She denies lightheadedness shortness of breath weakness or any new or worsening swelling though she is chronically mildly edemic.  This anemia is recurrent, chronic  Past Medical History:  Diagnosis Date  . Anemia   . CHF (congestive heart failure) ([place])   . Cirrhosis ([place])   . Depression   . Diabetes mellitus   . Esophageal bleed, non-variceal   . Eye hemorrhage    Behind left eye  . Fibromyalgia   . Hypercholesteremia   . Hypertension   . Hypothyroidism   . NAFLD (nonalcoholic fatty liver disease)   . Osteoarthrosis   . Osteoporosis   . PONV (postoperative nausea and vomiting)   . UTI (urinary tract infection) 11/12 end of the month   Patient feels that she passed a kidney stone at that time    Patient Active Problem List   Diagnosis Date Noted  . Acute anemia 09/29/2019  . Hepatic encephalopathy ([place]) 08/27/2019  . Altered mental status 02/27/2019  . Bacteriuria 02/27/2019  . Acute on chronic anemia 02/24/2019  . Acute hepatic encephalopathy  07/26/2018  . Obesity, Class III, BMI 40-49.9 (morbid obesity) ([place])   . Melena   . Anemia 07/02/2018  . UGI bleed 11/09/2017  . Insulin dependent diabetes mellitus   . Absolute anemia 09/13/2017  . Idiopathic esophageal varices with bleeding ([place]) 09/13/2017  . Other cirrhosis of liver ([place]) 08/22/2017  . NAFLD (nonalcoholic fatty liver disease) 08/14/2017  . Encephalopathy, hepatic ([place]) 08/14/2017  . Metabolic encephalopathy 14/43/1540  . Hyperammonemia ([place]) 07/18/2017  . Hypothyroidism 07/18/2017  . Acute on chronic right-sided congestive heart failure ([place]) 07/06/2017  . Pulmonary hypertension ([place]) 07/06/2017  . GAVE (gastric antral vascular ectasia) 07/06/2017  . GI bleeding 07/06/2017  . Symptomatic anemia 07/04/2017  . Hypokalemia 07/04/2017  . Chronic diastolic heart failure ([place]) 06/11/2017  . CAD (coronary artery disease) 06/11/2017  . Nocturnal hypoxemia 02/22/2017  . Idiopathic esophageal varices without bleeding ([place]) 01/31/2017  . Dyspnea and respiratory abnormalities 01/25/2017  . Esophageal varices in cirrhosis ([place]) 05/19/2016  . Hepatic cirrhosis ([place]) 05/19/2016  . Esophageal varices ([place]) 12/09/2012  . Dysphagia, unspecified(787.20) 12/09/2012  . Anxiety 08/26/2012  . Cirrhosis, nonalcoholic ([place]) 08/67/6195  . Diabetes mellitus ([place]) 10/09/2011  . History of esophageal varices 10/09/2011  . Iron deficiency anemia 10/09/2011  . GERD (gastroesophageal reflux disease) 10/09/2011  . Thrombocytopenia ([place]) 10/09/2011    Past Surgical History:  Procedure Laterality Date  . APPENDECTOMY  1980  . BILATERAL SALPINGOOPHORECTOMY    . CHOLECYSTECTOMY    .  COLONOSCOPY  03/15/2011  . COLONOSCOPY N/A 03/24/2016   Procedure: COLONOSCOPY;  Surgeon: [name], MD;  [facility];  Service: Endoscopy;  Laterality: N/A;  855  . ESOPHAGEAL BANDING  04/24/2012   Procedure: ESOPHAGEAL BANDING;  Surgeon: [name], MD;  [facility];   Service: Endoscopy;  Laterality: N/A;  . Esophageal BANDING  08/03/2012   [facility] [place] in [place] [facility] [place]  09/24/2012   Procedure: ESOPHAGEAL BANDING;  Surgeon: [name], MD;  Location: [facility];  Service: Endoscopy;  Laterality: N/A;  Banding x 3  . ESOPHAGEAL BANDING N/A 01/29/2013   Procedure: ESOPHAGEAL BANDING;  Surgeon: [name], MD;  [facility];  Service: Endoscopy;  Laterality: N/A;  . ESOPHAGEAL BANDING N/A 06/19/2014   Procedure: ESOPHAGEAL BANDING;  Surgeon: [name], MD;  [facility];  Service: Endoscopy;  Laterality: N/A;  . ESOPHAGEAL BANDING N/A 01/05/2016   Procedure: ESOPHAGEAL BANDING;  Surgeon: [name], MD;  [facility];  Service: Endoscopy;  Laterality: N/A;  . ESOPHAGEAL BANDING N/A 08/03/2016   Procedure: ESOPHAGEAL BANDING;  Surgeon: [name], MD;  [facility];  Service: Endoscopy;  Laterality: N/A;  . ESOPHAGEAL BANDING N/A 05/02/2017   Procedure: ESOPHAGEAL BANDING;  Surgeon: [name], MD;  [facility];  Service: Endoscopy;  Laterality: N/A;  . ESOPHAGOGASTRODUODENOSCOPY  04/24/2012   Procedure: ESOPHAGOGASTRODUODENOSCOPY (EGD);  Surgeon: [name], MD;  [facility];  Service: Endoscopy;  Laterality: N/A;  300  . ESOPHAGOGASTRODUODENOSCOPY N/A 01/29/2013   Procedure: ESOPHAGOGASTRODUODENOSCOPY (EGD);  Surgeon: [name], MD;  [facility];  Service: Endoscopy;  Laterality: N/A;  1200  . ESOPHAGOGASTRODUODENOSCOPY N/A 05/22/2013   Procedure: ESOPHAGOGASTRODUODENOSCOPY (EGD);  Surgeon: [name], MD;  [facility];  Service: Endoscopy;  Laterality: N/A;  1:55  . ESOPHAGOGASTRODUODENOSCOPY N/A 12/31/2013   Procedure: ESOPHAGOGASTRODUODENOSCOPY (EGD);  Surgeon: [name], MD;  [facility];  Service: Endoscopy;  Laterality: N/A;  1200  . ESOPHAGOGASTRODUODENOSCOPY N/A 06/19/2014    Procedure: ESOPHAGOGASTRODUODENOSCOPY (EGD);  Surgeon: [name], MD;  [facility];  Service: Endoscopy;  Laterality: N/A;  1055  . ESOPHAGOGASTRODUODENOSCOPY N/A 01/15/2015   Procedure: ESOPHAGOGASTRODUODENOSCOPY (EGD);  Surgeon: [name], MD;  [facility];  Service: Endoscopy;  Laterality: N/A;  730 - moved to 9:45 - moved to 1250-Ann notified pt  . ESOPHAGOGASTRODUODENOSCOPY N/A 06/30/2015   Procedure: ESOPHAGOGASTRODUODENOSCOPY (EGD);  Surgeon: [name], MD;  [facility];  Service: Endoscopy;  Laterality: N/A;  1200  . ESOPHAGOGASTRODUODENOSCOPY N/A 01/05/2016   Procedure: ESOPHAGOGASTRODUODENOSCOPY (EGD);  Surgeon: [name], MD;  [facility];  Service: Endoscopy;  Laterality: N/A;  830  . ESOPHAGOGASTRODUODENOSCOPY N/A 08/03/2016   Procedure: ESOPHAGOGASTRODUODENOSCOPY (EGD);  Surgeon: [name], MD;  [facility];  Service: Endoscopy;  Laterality: N/A;  930  . ESOPHAGOGASTRODUODENOSCOPY N/A 05/02/2017   Procedure: ESOPHAGOGASTRODUODENOSCOPY (EGD);  Surgeon: [name], MD;  [facility];  Service: Endoscopy;  Laterality: N/A;  12:00  . ESOPHAGOGASTRODUODENOSCOPY N/A 08/17/2017   Procedure: ESOPHAGOGASTRODUODENOSCOPY (EGD);  Surgeon: [name], MD;  [facility];  Service: Endoscopy;  Laterality: N/A;  8:15  . ESOPHAGOGASTRODUODENOSCOPY N/A 09/12/2017   Procedure: ESOPHAGOGASTRODUODENOSCOPY (EGD);  Surgeon: [name], MD;  [facility];  Service:  Endoscopy;  Laterality: N/A;  1040  . ESOPHAGOGASTRODUODENOSCOPY N/A 10/10/2017   Procedure: ESOPHAGOGASTRODUODENOSCOPY (EGD);  Surgeon: [name], MD;  [facility];  Service: Endoscopy;  Laterality: N/A;  225  . ESOPHAGOGASTRODUODENOSCOPY N/A 11/09/2017   Procedure: ESOPHAGOGASTRODUODENOSCOPY (EGD);  Surgeon: [name], MD;  [facility];  Service: Endoscopy;  Laterality: N/A;   . ESOPHAGOGASTRODUODENOSCOPY (EGD) WITH PROPOFOL  09/24/2012   Procedure: ESOPHAGOGASTRODUODENOSCOPY (EGD) WITH PROPOFOL;  Surgeon: [name], MD;  [facility];  Service: Endoscopy;  Laterality: N/A;  GE junction at 36  . ESOPHAGOGASTRODUODENOSCOPY (EGD) WITH PROPOFOL N/A 07/06/2017   Procedure: ESOPHAGOGASTRODUODENOSCOPY (EGD) WITH PROPOFOL;  Surgeon: [name], MD;  [facility];  Service: Endoscopy;  Laterality: N/A;  . ESOPHAGOGASTRODUODENOSCOPY W/ BANDING  08/2010  . GIVENS CAPSULE STUDY N/A 07/05/2017   Procedure: GIVENS CAPSULE STUDY;  Surgeon: [name], MD;  [facility];  Service: Endoscopy;  Laterality: N/A;  . HOT HEMOSTASIS N/A 08/17/2017   Procedure: HOT HEMOSTASIS (ARGON PLASMA COAGULATION/BICAP);  Surgeon: [name], MD;  [facility];  Service: Endoscopy;  Laterality: N/A;  . HOT HEMOSTASIS  09/12/2017   Procedure: HOT HEMOSTASIS (ARGON PLASMA COAGULATION/BICAP);  Surgeon: [name], MD;  [facility];  Service: Endoscopy;;  gastric  . HOT HEMOSTASIS  10/10/2017   Procedure: HOT HEMOSTASIS (ARGON PLASMA COAGULATION/BICAP);  Surgeon: [name], MD;  [facility];  Service: Endoscopy;;  . POLYPECTOMY  03/24/2016   Procedure: POLYPECTOMY;  Surgeon: [name], MD;  [facility];  Service: Endoscopy;;  sigmoid polyp  . PORTACATH PLACEMENT Right 06/02/2019   Procedure: INSERTION PORT-A-CATH;  Surgeon: Aviva Signs, MD;  Location: [facility];  Service: General;  Laterality: Right;  . RIGHT HEART CATH N/A 04/16/2017   Procedure: Right Heart Cath;  Surgeon: [name], MD;  Location: [facility];  Service: Cardiovascular;  Laterality: N/A;  . RIGHT HEART CATH N/A 07/12/2017   Procedure: RIGHT HEART CATH;  Surgeon: [name], MD;  Location: [facility];  Service: Cardiovascular;  Laterality: N/A;  . TONSILLECTOMY    . UPPER GASTROINTESTINAL ENDOSCOPY   03/15/2011   EGD ED BANDING/TCS  . UPPER GASTROINTESTINAL ENDOSCOPY  09/05/2010  . UPPER GASTROINTESTINAL ENDOSCOPY  08/11/2010  . VAGINAL HYSTERECTOMY       OB History   No obstetric history on file.     Family History  Problem Relation Age of Onset  . Lung cancer Mother   . Diabetes Father   . Diabetes Sister   . Hypertension Sister   . Hypothyroidism Sister   . Colon cancer Brother   . Diabetes Sister   . Hypothyroidism Brother   . Healthy Daughter   . Obesity Daughter   . Hypertension Daughter     Social History   Tobacco Use  . Smoking status: Never Smoker  . Smokeless tobacco: Never Used  Substance Use Topics  . Alcohol use: No    Alcohol/week: 0.0 standard drinks  . Drug use: No    Home Medications Prior to Admission medications   Medication Sig Start Date End Date Taking? Authorizing Provider  bumetanide (BUMEX) 2 MG tablet Take 2 mg by mouth daily.  09/10/19  Yes Rehman, Mechele Dawley, MD  busPIRone (BUSPAR) 10 MG tablet Take 10 mg by mouth 2 (two) times daily.   Yes [provider]  Cholecalciferol 25 MCG (1000 UT)  tablet Take 1,000 Units by mouth every other day.    Yes [provider]  HUMALOG 100 UNIT/ML injection Inject 15-35 Units into the skin 3 (three) times daily with meals. Sliding scale insulin 100-150= 15 units 150-200= 20 units 250-300= 30 units 300-350= 35 units 07/01/18  Yes [provider]  Insulin Glargine (LANTUS SOLOSTAR) 100 UNIT/ML Solostar Pen Inject 10-55 Units into the skin See admin instructions. Inject 55 units subcutaneously in the morning & 10 units subcutaneously at night, if needed 03/06/19  Yes [provider]  lactulose (CHRONULAC) 10 GM/15ML solution Take 60 mLs (40 g total) by mouth 4 (four) times daily. If having watery stools >3 times per day, then reduce to 3 times daily. 09/02/19 12/01/19 Yes Enzo Bi, MD  levothyroxine (SYNTHROID, LEVOTHROID) 25 MCG tablet Take 25 mcg by mouth daily before  breakfast.    Yes [provider]  lidocaine-prilocaine (EMLA) cream Apply 1 application topically daily as needed. 08/19/19  Yes [provider]  magnesium oxide (MAG-OX) 400 MG tablet Take 400 mg by mouth daily.    Yes [provider]  pantoprazole (PROTONIX) 40 MG tablet Take 1 tablet (40 mg total) by mouth daily before breakfast. 09/09/19  Yes Rehman, Mechele Dawley, MD  Pediatric Multivitamins-Iron (FLINTSTONES PLUS IRON PO) Take 2 tablets by mouth daily.   Yes [provider]  potassium chloride SA (KLOR-CON) 20 MEQ tablet Take 2 tablets (40 mEq total) by mouth daily. Patient taking differently: Take 20 mEq by mouth 2 (two) times daily.  09/02/19  Yes Enzo Bi, MD  rifaximin (XIFAXAN) 550 MG TABS tablet Take 1 tablet (550 mg total) by mouth 2 (two) times daily. 02/27/19  Yes Barton Dubois, MD  rosuvastatin (CRESTOR) 10 MG tablet Take 10 mg by mouth daily. 09/10/19  Yes [provider]  sertraline (ZOLOFT) 50 MG tablet Take 50 mg by mouth daily.  03/10/19  Yes [provider]  spironolactone (ALDACTONE) 100 MG tablet Take 1 tablet (100 mg total) by mouth daily. 10/06/19 12/01/19 Yes Shah, Pratik D, DO  trimethoprim-polymyxin b (POLYTRIM) ophthalmic solution Place 1 drop into the left eye See admin instructions. Use 3 to 4 times daily for 2 days following monthly eye injection 05/06/18  Yes [provider]  zinc sulfate 220 (50 Zn) MG capsule Take 1 capsule (220 mg total) by mouth daily. 08/01/18  Yes Barton Dubois, MD    Allergies    Ferumoxytol and Tramadol hcl  Review of Systems   Review of Systems  All other systems reviewed and are negative.   Physical Exam Updated Vital Signs BP (!) 117/42 (BP Location: Left Arm)   Pulse 79   Temp 97.7 F (36.5 C) (Oral)   Resp 14   Ht 1.626 m (5' 4" )   Wt 79.6 kg   SpO2 100%   BMI 30.12 kg/m   Physical Exam Vitals and nursing note reviewed.  Constitutional:      General: She is  not in acute distress.    Appearance: She is well-developed.  HENT:     Head: Normocephalic and atraumatic.     Mouth/Throat:     Pharynx: No oropharyngeal exudate.  Eyes:     General:        Right eye: No discharge.        Left eye: No discharge.     Conjunctiva/sclera: Conjunctivae normal.     Pupils: Pupils are equal, round, and reactive to light.     Comments: Mild  jaundice  Neck:     Thyroid: No thyromegaly.     Vascular: No JVD.  Cardiovascular:     Rate and Rhythm: Normal rate and regular rhythm.     Heart sounds: Normal heart sounds. No murmur. No friction rub. No gallop.   Pulmonary:     Effort: Pulmonary effort is normal. No respiratory distress.     Breath sounds: Normal breath sounds. No wheezing or rales.  Abdominal:     General: Bowel sounds are normal. There is no distension.     Palpations: Abdomen is soft. There is no mass.     Tenderness: There is no abdominal tenderness.     Comments: Nontender but obese abdomen  Musculoskeletal:        General: No tenderness. Normal range of motion.     Cervical back: Normal range of motion and neck supple.     Right lower leg: Edema present.     Left lower leg: Edema present.     Comments: Scant bilateral symmetrical edema  Lymphadenopathy:     Cervical: No cervical adenopathy.  Skin:    General: Skin is warm and dry.     Findings: No erythema or rash.  Neurological:     Mental Status: She is alert.     Coordination: Coordination normal.  Psychiatric:        Behavior: Behavior normal.     ED Results / Procedures / Treatments   Labs (all labs ordered are listed, but only abnormal results are displayed) Labs Reviewed  CBC - Abnormal; Notable for the following components:      Result Value   WBC 2.0 (*)    RBC 2.32 (*)    Hemoglobin 5.9 (*)    HCT 21.6 (*)    MCH 25.4 (*)    MCHC 27.3 (*)    RDW 18.4 (*)    Platelets 38 (*)    All other components within normal limits  BASIC METABOLIC PANEL - Abnormal;  Notable for the following components:   CO2 20 (*)    Glucose, Bld 152 (*)    BUN 29 (*)    Creatinine, Ser 1.01 (*)    Calcium 8.1 (*)    GFR calc non Af Amer 59 (*)    All other components within normal limits  TYPE AND SCREEN  PREPARE RBC (CROSSMATCH)    EKG None  Radiology No results found.  Procedures Procedures (including critical care time)  Medications Ordered in ED Medications  0.9 %  sodium chloride infusion (Manually program via Guardrails IV Fluids) (has no administration in time range)    ED Course  I have reviewed the triage vital signs and the nursing notes.  Pertinent labs & imaging results that were available during my care of the patient were reviewed by me and considered in my medical decision making (see chart for details).  Clinical Course as of Nov 30 2005  Mon Dec 01, 2019  2004 Review of the prior medical record shows that the patient has chronic pancytopenia - per the recommendations from Dr. Laural Golden - may transfuse and d/c - the pt has tolerated the first unit of pRBC's and is getting the second now - stable appearnig for d/c with normal BP.   [BM]    Clinical Course User Index [BM] Noemi Chapel, MD   MDM Rules/Calculators/A&P                      This patient has  no acute distress, she is chronically but worsening anemia, blood pressure is 117/42 with a pulse of 79, she is feeling well otherwise and does not want to be admitted to the hospital, her gastroenterologist called ahead asking for an transfusion in the ED, I think this is reasonable at this time, will order 2 units, recheck hemoglobin, the patient is agreeable to the plan  Final Clinical Impression(s) / ED Diagnoses Final diagnoses:  Chronic GI bleeding  Anemia, unspecified type    Rx / DC Orders ED Discharge Orders    None       Noemi Chapel, MD 12/01/19 2008

## 2019-12-02 LAB — BPAM RBC
Blood Product Expiration Date: 202104052359
Blood Product Expiration Date: 202104082359
ISSUE DATE / TIME: 202103151659
ISSUE DATE / TIME: 202103151930
Unit Type and Rh: 9500
Unit Type and Rh: 9500

## 2019-12-02 LAB — TYPE AND SCREEN
ABO/RH(D): O NEG
Antibody Screen: NEGATIVE
Unit division: 0
Unit division: 0

## 2019-12-03 DIAGNOSIS — E039 Hypothyroidism, unspecified: Secondary | ICD-10-CM | POA: Diagnosis not present

## 2019-12-03 DIAGNOSIS — E7800 Pure hypercholesterolemia, unspecified: Secondary | ICD-10-CM | POA: Diagnosis not present

## 2019-12-03 DIAGNOSIS — K7290 Hepatic failure, unspecified without coma: Secondary | ICD-10-CM | POA: Diagnosis not present

## 2019-12-03 DIAGNOSIS — E1165 Type 2 diabetes mellitus with hyperglycemia: Secondary | ICD-10-CM | POA: Diagnosis not present

## 2019-12-03 DIAGNOSIS — I10 Essential (primary) hypertension: Secondary | ICD-10-CM | POA: Diagnosis not present

## 2019-12-04 ENCOUNTER — Other Ambulatory Visit: Payer: Self-pay

## 2019-12-04 ENCOUNTER — Encounter (HOSPITAL_COMMUNITY)
Admission: RE | Admit: 2019-12-04 | Discharge: 2019-12-04 | Disposition: A | Discharge status: Home / Self Care | Payer: PPO | Source: Ambulatory Visit | Attending: Internal Medicine | Admitting: Internal Medicine

## 2019-12-04 DIAGNOSIS — K7581 Nonalcoholic steatohepatitis (NASH): Secondary | ICD-10-CM | POA: Diagnosis not present

## 2019-12-04 LAB — SAMPLE TO BLOOD BANK

## 2019-12-04 LAB — HEMOGLOBIN AND HEMATOCRIT, BLOOD
HCT: 25.3 % — ABNORMAL LOW (ref 36.0–46.0)
Hemoglobin: 7.4 g/dL — ABNORMAL LOW (ref 12.0–15.0)

## 2019-12-04 MED ORDER — HEPARIN SOD (PORK) LOCK FLUSH 100 UNIT/ML IV SOLN
500.0000 [IU] | Freq: Once | INTRAVENOUS | Status: AC
  Administered 2019-12-04: 500 [IU] via INTRAVENOUS

## 2019-12-05 MED ORDER — SERTRALINE 50 MG TABLET
ORAL_TABLET | Freq: Every day | ORAL | 1 refills | 30 days | Status: CP
Start: 2019-12-05 — End: ?

## 2019-12-08 ENCOUNTER — Other Ambulatory Visit: Payer: Self-pay

## 2019-12-08 ENCOUNTER — Encounter (HOSPITAL_COMMUNITY)
Admission: RE | Admit: 2019-12-08 | Discharge: 2019-12-08 | Disposition: A | Discharge status: Home / Self Care | Payer: PPO | Source: Ambulatory Visit | Attending: Internal Medicine | Admitting: Internal Medicine

## 2019-12-08 DIAGNOSIS — K7581 Nonalcoholic steatohepatitis (NASH): Secondary | ICD-10-CM | POA: Diagnosis not present

## 2019-12-08 LAB — PREPARE RBC (CROSSMATCH)

## 2019-12-08 LAB — HEMOGLOBIN AND HEMATOCRIT, BLOOD
HCT: 22.6 % — ABNORMAL LOW (ref 36.0–46.0)
Hemoglobin: 6.2 g/dL — CL (ref 12.0–15.0)

## 2019-12-08 MED ORDER — HEPARIN SOD (PORK) LOCK FLUSH 100 UNIT/ML IV SOLN
500.0000 [IU] | Freq: Once | INTRAVENOUS | Status: AC
  Administered 2019-12-08: 500 [IU] via INTRAVENOUS

## 2019-12-09 ENCOUNTER — Encounter (HOSPITAL_COMMUNITY): Payer: Self-pay

## 2019-12-09 ENCOUNTER — Encounter (HOSPITAL_COMMUNITY)
Admission: RE | Admit: 2019-12-09 | Discharge: 2019-12-09 | Disposition: A | Discharge status: Home / Self Care | Payer: PPO | Source: Ambulatory Visit | Attending: Internal Medicine | Admitting: Internal Medicine

## 2019-12-09 DIAGNOSIS — K7581 Nonalcoholic steatohepatitis (NASH): Secondary | ICD-10-CM | POA: Diagnosis not present

## 2019-12-09 MED ORDER — HEPARIN SOD (PORK) LOCK FLUSH 100 UNIT/ML IV SOLN
500.0000 [IU] | Freq: Once | INTRAVENOUS | Status: AC
  Administered 2019-12-09: 500 [IU] via INTRAVENOUS

## 2019-12-09 MED ORDER — SODIUM CHLORIDE 0.9% IV SOLUTION: Freq: Once | INTRAVENOUS | Status: AC

## 2019-12-10 ENCOUNTER — Telehealth (INDEPENDENT_AMBULATORY_CARE_PROVIDER_SITE_OTHER): Payer: Self-pay | Admitting: Internal Medicine

## 2019-12-10 LAB — TYPE AND SCREEN
ABO/RH(D): O NEG
Antibody Screen: NEGATIVE
Unit division: 0
Unit division: 0

## 2019-12-10 LAB — BPAM RBC
Blood Product Expiration Date: 202104132359
Blood Product Expiration Date: 202104132359
ISSUE DATE / TIME: 202103230900
ISSUE DATE / TIME: 202103231044
Unit Type and Rh: 9500
Unit Type and Rh: 9500

## 2019-12-10 NOTE — Telephone Encounter (Signed)
Dr.Rehman has been made aware.

## 2019-12-10 NOTE — Telephone Encounter (Signed)
Spouse called stated he would like [name] to contact [facility] and let them know patients condition and find out why she's losing so much blood - he thinks some kind of procedure should be done - please advise -

## 2019-12-12 ENCOUNTER — Telehealth (INDEPENDENT_AMBULATORY_CARE_PROVIDER_SITE_OTHER): Payer: Self-pay | Admitting: *Deleted

## 2019-12-12 ENCOUNTER — Encounter (INDEPENDENT_AMBULATORY_CARE_PROVIDER_SITE_OTHER): Payer: Self-pay | Admitting: *Deleted

## 2019-12-15 ENCOUNTER — Encounter (HOSPITAL_COMMUNITY)
Admission: RE | Admit: 2019-12-15 | Discharge: 2019-12-15 | Disposition: A | Discharge status: Home / Self Care | Payer: PPO | Source: Ambulatory Visit | Attending: Internal Medicine | Admitting: Internal Medicine

## 2019-12-15 ENCOUNTER — Other Ambulatory Visit: Payer: Self-pay

## 2019-12-15 DIAGNOSIS — K7581 Nonalcoholic steatohepatitis (NASH): Secondary | ICD-10-CM | POA: Diagnosis not present

## 2019-12-15 LAB — HEMOGLOBIN AND HEMATOCRIT, BLOOD
HCT: 23.1 % — ABNORMAL LOW (ref 36.0–46.0)
Hemoglobin: 6.7 g/dL — CL (ref 12.0–15.0)

## 2019-12-15 LAB — PREPARE RBC (CROSSMATCH)

## 2019-12-15 LAB — SAMPLE TO BLOOD BANK

## 2019-12-15 MED ORDER — HEPARIN SOD (PORK) LOCK FLUSH 100 UNIT/ML IV SOLN
500.0000 [IU] | Freq: Once | INTRAVENOUS | Status: AC
  Administered 2019-12-15: 500 [IU] via INTRAVENOUS

## 2019-12-15 MED ORDER — BUSPIRONE 10 MG TABLET
ORAL_TABLET | Freq: Two times a day (BID) | ORAL | 0 refills | 30 days | Status: CP
Start: 2019-12-15 — End: ?

## 2019-12-15 NOTE — Telephone Encounter (Signed)
Patient was sent a My Chart Message.

## 2019-12-15 NOTE — Unmapped (Signed)
Medication name and strength: busPIRone (BUSPAR) 10 MG tablet     Correct directions:  Take 10 mg by mouth two (2) times a day.    Last provider and date patient seen: 10/29/2018 with Dr. Colin Benton    Upcoming appointment with what provider: 01/06/2020 with Dr. Delford Field    Pharmacy: [facility] [address], [place] - [address]   [address], [place] [address]   Phone:  [phone] ??Fax:  [phone]

## 2019-12-17 ENCOUNTER — Encounter (HOSPITAL_COMMUNITY)
Admission: RE | Admit: 2019-12-17 | Discharge: 2019-12-17 | Disposition: A | Discharge status: Home / Self Care | Payer: PPO | Source: Ambulatory Visit | Attending: Internal Medicine | Admitting: Internal Medicine

## 2019-12-17 ENCOUNTER — Other Ambulatory Visit: Payer: Self-pay

## 2019-12-17 ENCOUNTER — Encounter (HOSPITAL_COMMUNITY): Payer: Self-pay

## 2019-12-17 DIAGNOSIS — K7581 Nonalcoholic steatohepatitis (NASH): Secondary | ICD-10-CM | POA: Diagnosis not present

## 2019-12-17 MED ORDER — SODIUM CHLORIDE 0.9% IV SOLUTION: Freq: Once | INTRAVENOUS | Status: DC

## 2019-12-17 NOTE — Unmapped (Signed)
Patient left a message yesterday. She needs to talk with the liver transplant coordinator.

## 2019-12-18 LAB — TYPE AND SCREEN
ABO/RH(D): O NEG
Antibody Screen: NEGATIVE
Unit division: 0
Unit division: 0

## 2019-12-18 LAB — BPAM RBC
Blood Product Expiration Date: 202105022359
Blood Product Expiration Date: 202105032359
ISSUE DATE / TIME: 202103311009
ISSUE DATE / TIME: 202103311142
Unit Type and Rh: 9500
Unit Type and Rh: 9500

## 2019-12-19 DIAGNOSIS — K7290 Hepatic failure, unspecified without coma: Principal | ICD-10-CM

## 2019-12-19 NOTE — Unmapped (Signed)
Return Call placed and spoke with Marilyn French and her husband Marilyn French. Patient requiring multiple blood transfusions / week.  Per Epic report, due for upper endoscopy. Order placed - advised Casimiro Needle that dept will contact him to schedule.    Recent CT to be reviewed @ [facility] next week, patient schedule to see Dr. Ruffin Frederick  4/5.

## 2019-12-22 ENCOUNTER — Other Ambulatory Visit: Payer: Self-pay

## 2019-12-22 ENCOUNTER — Encounter (HOSPITAL_COMMUNITY)
Admission: RE | Admit: 2019-12-22 | Discharge: 2019-12-22 | Disposition: A | Discharge status: Home / Self Care | Payer: PPO | Source: Ambulatory Visit | Attending: Internal Medicine | Admitting: Internal Medicine

## 2019-12-22 ENCOUNTER — Ambulatory Visit
Admit: 2019-12-22 | Discharge: 2019-12-23 | Payer: PRIVATE HEALTH INSURANCE | Attending: Internal Medicine | Primary: Internal Medicine

## 2019-12-22 DIAGNOSIS — K7460 Unspecified cirrhosis of liver: Principal | ICD-10-CM

## 2019-12-22 DIAGNOSIS — D509 Iron deficiency anemia, unspecified: Secondary | ICD-10-CM | POA: Diagnosis not present

## 2019-12-22 LAB — HEMOGLOBIN AND HEMATOCRIT, BLOOD
HCT: 28.7 % — ABNORMAL LOW (ref 36.0–46.0)
Hemoglobin: 8.5 g/dL — ABNORMAL LOW (ref 12.0–15.0)

## 2019-12-22 LAB — SAMPLE TO BLOOD BANK

## 2019-12-22 MED ORDER — HEPARIN SOD (PORK) LOCK FLUSH 100 UNIT/ML IV SOLN
500.0000 [IU] | Freq: Once | INTRAVENOUS | Status: AC
  Administered 2019-12-22: 14:00:00 500 [IU] via INTRAVENOUS

## 2019-12-22 NOTE — Unmapped (Addendum)
Miralax 1-2 doses per day, in a big glass of water, coffee or tea.       We'll get you set up for an Upper Endoscopy as soon as possible.

## 2019-12-22 NOTE — Unmapped (Signed)
[facility], [place]      Referring Provider:  Janyth Pupa, MD  [address]  [address] [place],  [place] [address]    Primary Care Provider:  Ignatius Specking, MD    Other Specialist(s):          Reason for Visit: Marilyn French(DOB: 05/04/1956) is a 64 y.o. female who returns for decompensated cirrhosis.    Problem List:    has Abnormal echocardiogram; NAFLD (nonalcoholic fatty liver disease); Cirrhosis (CMS-HCC); GAVE (gastric antral vascular ectasia); Hypothyroid; Chronic blood loss anemia; Generalized anxiety disorder; Hepatic encephalopathy (CMS-HCC); Iron deficiency anemia; Type 2 diabetes mellitus without complication, with long-term current use of insulin (CMS-HCC); Pulmonary hypertension (CMS-HCC); Aspiration into airway; and Thrombocytopenia (CMS-HCC) on their problem list.     Assessment:        64 yo with decompensated NASH cirrhosis.  Cirrhosis complicated by anemia fatigue. PSE and fluid issues controlled.               MELD-Na score: 18 at 10/21/2019  4:00 AM  MELD score: 18 at 10/21/2019  4:00 AM  Calculated from:  Serum Creatinine: 1.08 mg/dL at 4/0/1027  2:53 AM  Serum Sodium: 137 mmol/L at 10/21/2019  4:00 AM  Total Bilirubin: 1.9 mg/dL at 02/21/4402  4:74 AM  INR(ratio): 2.08 at 10/21/2019  4:00 AM  Age: 64 years 5 months     Plan:       -this patient was seen and examined with Dr Ruffin Frederick  -Con't PT to regain strength  -EGD for GAVE treatment, ASAP  -RTC in 3-6 months        CHIEF COMPLAINT: Fatigue, anemia    History of Present Illness: This is a 64 y.o. year old female with history of cirrhosis secondary to NAFLD.    She was last seen hepatology clinic on 07/2018.  Since then she has had multiple hospitalizations for anemia.  She has been deferred/turned down for OLT x 2 since then as well due to frailty. But was regaining strength until her current worsening anemia. She is requiring 2 Units of PRC weekly.      Complications of her liver disease include variceal bleed, ascites and hepatic encephalopathy.    In regards to encephalopathy she is currently on lactulose monotherapy.  She is achieving 3???5 bowel movements a day.  Her ascites is currently being managed with torsemide 20 mg daily and spironolactone 50 mg daily.  She has not required any LVP's.  She did undergo a right heart catheterization in 01/2018 at [facility] which showed no evidence for pulmonary hypertension.  She is also seen [facility] pulmonary.  She underwent a DEXA scan on 12/25/2017.  An MRI from 12/25/2017 showed no evidence for [facility].  There is questionable nonocclusive portal vein thrombus.  Since her last clinic visit patient did undergo a repeat MRI and CT on 03/28/2018.  Her MRI was reviewed at [facility] conference and felt the PVT persists and is concerning for progression, worsening portal htn and compromising her transplant candidacy by loss of PV and SMV. ??Anti-coagulation is not a good option due to her chronic PHG/GAVE blood loss.  We recommended proceeding with a TIPS.  She underwent a TIPS on 07/08/2018.  She also underwent embolization of mesocaval varices.  Her hepatic venous pressure gradient went from 10 to 2 mm Hg.  Since her TIPS she was hospitalized for hepatic encephalopathy on 07/26/2018 and now started on Xifaxan.    She  does have a history of hypertension and hyperlipidemia. She is immune to hepatitis A and B.    Social History:    Social History     Socioeconomic History   ??? Marital status: Married     Spouse name: Not on file   ??? Number of children: Not on file   ??? Years of education: Not on file   ??? Highest education level: Not on file   Occupational History   ??? Not on file   Social Needs   ??? Financial resource strain: Not on file   ??? Food insecurity     Worry: Patient refused     Inability: Patient refused   ??? Transportation needs     Medical: Not on file     Non-medical: Not on file   Tobacco Use   ??? Smoking status: Never Smoker   ??? Smokeless tobacco: Never Used   Substance and Sexual Activity   ??? Alcohol use: No   ??? Drug use: No   ??? Sexual activity: Not on file   Lifestyle   ??? Physical activity     Days per week: Not on file     Minutes per session: Not on file   ??? Stress: Not on file   Relationships   ??? Social Wellsite geologist on phone: Not on file     Gets together: Not on file     Attends religious service: Not on file     Active member of club or organization: Not on file     Attends meetings of clubs or organizations: Not on file     Relationship status: Not on file   Other Topics Concern   ??? Not on file   Social History Narrative   ??? Not on file         Medications:      Current Outpatient Medications:   ???  bumetanide (BUMEX) 2 MG tablet, Take 1 tablet (2 mg total) by mouth daily., Disp: 30 tablet, Rfl: 1  ???  busPIRone (BUSPAR) 10 MG tablet, Take 1 tablet (10 mg total) by mouth two (2) times a day., Disp: 60 tablet, Rfl: 0  ???  cholecalciferol, vitamin D3, (CHOLECALCIFEROL) 1,000 unit tablet, Take 1,000 Units by mouth every other day. , Disp: , Rfl:   ???  insulin ASPART (NOVOLOG FLEXPEN U-100 INSULIN) 100 unit/mL injection pen, Use with given sliding scale  CBG 70-120: 0 units CBG 121-150: 1 unit CBG 151-200: 2 units CBG 201-250: 3 units CBG 251-300: 5 units CBG 301-350: 7 units CBG greater than 351: 9 units, Disp: , Rfl:   ???  insulin glargine (LANTUS SOLOSTAR U-100 INSULIN) 100 unit/mL (3 mL) injection pen, Inject 0.45 mL (45 Units total) under the skin daily., Disp: 1350 Units, Rfl: 0  ???  insulin lispro (HUMALOG) 100 unit/mL injection, INJECT 15 TO 35 UNITS SUBCUTANEUOSLY PER SLIDING SCALE 3 TIMES DAILY, Disp: , Rfl:   ???  lactulose (CHRONULAC) 10 gram/15 mL solution, Take 20 g by mouth Four (4) times a day. , Disp: , Rfl:   ???  levothyroxine (SYNTHROID, LEVOTHROID) 25 MCG tablet, Take 25 mcg by mouth daily., Disp: , Rfl:   ???  lidocaine-prilocaine (EMLA) 2.5-2.5 % cream, , Disp: , Rfl:   ???  MAGNESIUM ORAL, Take by mouth., Disp: , Rfl:   ???  multivit with iron,minerals (FLINTSTONES COMPLETE, IRON, ORAL), Take by mouth., Disp: , Rfl:   ???  pantoprazole (PROTONIX) 40 MG tablet,  Take 40 mg by mouth Two (2) times a day. , Disp: , Rfl:   ???  pediatric multivit-iron-min (FLINTSTONES COMPLETE) tablet, Chew 2 tablets daily. , Disp: , Rfl:   ???  pen needle, diabetic 31 gauge x 1/4 Ndle, , Disp: , Rfl:   ???  potassium chloride SA (K-DUR,KLOR-CON) 20 MEQ tablet, Take 20 mEq by mouth daily. , Disp: , Rfl:   ???  rifAXIMin (XIFAXAN) 550 mg Tab, Take 1 tablet (550 mg total) by mouth Two (2) times a day., Disp: 60 tablet, Rfl: 11  ???  rosuvastatin (CRESTOR) 5 MG tablet, Take 5 mg by mouth every other day., Disp: , Rfl:   ???  sertraline (ZOLOFT) 50 MG tablet, Take 1 tablet (50 mg total) by mouth daily. 1 tablet (50mg ) by mouth daily., Disp: 30 tablet, Rfl: 1  ???  spironolactone (ALDACTONE) 100 MG tablet, Take 1 tablet (100 mg total) by mouth daily., Disp: 30 tablet, Rfl: 11  ???  ZINC ACETATE ORAL, Take by mouth., Disp: , Rfl:   ???  OXYGEN-AIR DELIVERY SYSTEMS MISC, Inhale 2 L nightly., Disp: , Rfl:       Vital Signs:     BP 116/42  - Pulse 71  - Temp 37.1 ??C (98.7 ??F)  - Wt 82 kg (180 lb 12.8 oz)  - LMP  (LMP Unknown)  - SpO2 100%  - BMI 31.03 kg/m??   Body mass index is 31.03 kg/m??.    Physical Exam:    Normal comprehensive exam:      Constitutional:   Alert, oriented x 3, no acute distress, well nourished   Mental Status:   Thought organized, appropriate affect, normal fluent speech.   HEENT:   PEERL, conjunctiva clear, anicteric, oropharynx clear, neck supple, no LAD.   Respiratory: Clear to auscultation, and percussion to the bases, unlabored breathing.     Cardiac: Regular rate and rhythm normal S1 and S2, no murmur.      Abdomen: Soft, non-distended, non-tender, no organomegaly or masses.     Perianal/Rectal Exam Not performed.     Extremities:   No edema, well perfused.   Musculoskeletal: No joint swelling or tenderness noted, no deformities.     Skin: No rashes, jaundice or skin lesions noted.     Neuro: No focal deficits.          Diagnostic Studies:  I have reviewed all pertinent diagnostic studies, including:    GI Procedures  reviewed  Radiographic studies  reviewed    Laboratory results      Labs reviewed from [facility], last Hgb <7, did get 2u PRCs.     No visits with results within 1 Week(s) from this visit.   Latest known visit with results is:   No results displayed because visit has over 200 results.        Lab Results   Component Value Date    WBC 1.6 (L) 10/21/2019    HGB 8.2 (L) 10/21/2019    HCT 25.1 (L) 10/21/2019    PLT 31 (L) 10/21/2019       Lab Results   Component Value Date    NA 137 10/21/2019    K 3.6 10/21/2019    CL 109 (H) 10/21/2019    CO2 22.0 10/21/2019    BUN 24 (H) 10/21/2019    CREATININE 1.08 (H) 10/21/2019    GLU 88 10/21/2019    CALCIUM 8.3 (L) 10/21/2019    MG 2.1 10/21/2019    PHOS  3.0 10/21/2019       Lab Results   Component Value Date    BILITOT 1.9 (H) 10/21/2019    BILIDIR 0.40 10/06/2019    PROT 5.5 (L) 10/21/2019    ALBUMIN 3.1 (L) 10/21/2019    ALT 17 10/21/2019    AST 32 10/21/2019    ALKPHOS 63 10/21/2019    GGT 32 08/12/2019       Lab Results   Component Value Date    PT 23.9 (H) 10/21/2019    INR 2.08 10/21/2019    APTT 33.0 10/05/2019

## 2019-12-24 ENCOUNTER — Encounter (INDEPENDENT_AMBULATORY_CARE_PROVIDER_SITE_OTHER): Payer: Self-pay | Admitting: *Deleted

## 2019-12-25 ENCOUNTER — Encounter (INDEPENDENT_AMBULATORY_CARE_PROVIDER_SITE_OTHER): Payer: PPO | Admitting: Ophthalmology

## 2019-12-25 DIAGNOSIS — E11311 Type 2 diabetes mellitus with unspecified diabetic retinopathy with macular edema: Secondary | ICD-10-CM

## 2019-12-25 DIAGNOSIS — H35033 Hypertensive retinopathy, bilateral: Secondary | ICD-10-CM | POA: Diagnosis not present

## 2019-12-25 DIAGNOSIS — E113312 Type 2 diabetes mellitus with moderate nonproliferative diabetic retinopathy with macular edema, left eye: Secondary | ICD-10-CM | POA: Diagnosis not present

## 2019-12-25 DIAGNOSIS — I10 Essential (primary) hypertension: Secondary | ICD-10-CM

## 2019-12-25 DIAGNOSIS — H2513 Age-related nuclear cataract, bilateral: Secondary | ICD-10-CM | POA: Diagnosis not present

## 2019-12-25 DIAGNOSIS — E113391 Type 2 diabetes mellitus with moderate nonproliferative diabetic retinopathy without macular edema, right eye: Secondary | ICD-10-CM

## 2019-12-25 DIAGNOSIS — H43813 Vitreous degeneration, bilateral: Secondary | ICD-10-CM

## 2019-12-29 ENCOUNTER — Encounter (HOSPITAL_COMMUNITY)
Admission: RE | Admit: 2019-12-29 | Discharge: 2019-12-29 | Disposition: A | Discharge status: Home / Self Care | Payer: PPO | Source: Ambulatory Visit | Attending: Internal Medicine | Admitting: Internal Medicine

## 2019-12-29 ENCOUNTER — Other Ambulatory Visit: Payer: Self-pay

## 2019-12-29 DIAGNOSIS — D509 Iron deficiency anemia, unspecified: Secondary | ICD-10-CM | POA: Diagnosis not present

## 2019-12-29 LAB — HEMOGLOBIN AND HEMATOCRIT, BLOOD
HCT: 24.3 % — ABNORMAL LOW (ref 36.0–46.0)
Hemoglobin: 7 g/dL — ABNORMAL LOW (ref 12.0–15.0)

## 2019-12-29 LAB — SAMPLE TO BLOOD BANK

## 2019-12-29 LAB — PREPARE RBC (CROSSMATCH)

## 2019-12-29 MED ORDER — HEPARIN SOD (PORK) LOCK FLUSH 100 UNIT/ML IV SOLN
500.0000 [IU] | Freq: Once | INTRAVENOUS | Status: AC
  Administered 2019-12-29: 11:00:00 500 [IU] via INTRAVENOUS

## 2019-12-30 ENCOUNTER — Encounter (HOSPITAL_COMMUNITY): Payer: Self-pay

## 2019-12-30 ENCOUNTER — Encounter (HOSPITAL_COMMUNITY)
Admission: RE | Admit: 2019-12-30 | Discharge: 2019-12-30 | Disposition: A | Discharge status: Home / Self Care | Payer: PPO | Source: Ambulatory Visit | Attending: Internal Medicine | Admitting: Internal Medicine

## 2019-12-30 DIAGNOSIS — K7290 Hepatic failure, unspecified without coma: Principal | ICD-10-CM

## 2019-12-30 DIAGNOSIS — Z01818 Encounter for other preprocedural examination: Principal | ICD-10-CM

## 2019-12-30 DIAGNOSIS — D509 Iron deficiency anemia, unspecified: Secondary | ICD-10-CM | POA: Diagnosis not present

## 2019-12-30 MED ORDER — SODIUM CHLORIDE 0.9% IV SOLUTION: Freq: Once | INTRAVENOUS | Status: AC

## 2019-12-30 NOTE — Unmapped (Signed)
Call placed and spoke with Hildred Priest - made him aware of recent Ct abd review and decision to move forward with transplant eval. Given length of time since testing, new appt's to be made.

## 2019-12-31 LAB — BPAM RBC
Blood Product Expiration Date: 202105112359
ISSUE DATE / TIME: 202104130841
Unit Type and Rh: 9500

## 2019-12-31 LAB — TYPE AND SCREEN
ABO/RH(D): O NEG
Antibody Screen: NEGATIVE
Unit division: 0

## 2019-12-31 NOTE — Unmapped (Signed)
Call placed to [facility] to regarding pre liver txplt evals.  Belem states the best person to speak to is Casimiro Needle but he is unavailable.  Barbara took my # and states she will have him call me back asap.

## 2020-01-02 ENCOUNTER — Ambulatory Visit: Admit: 2020-01-02 | Discharge: 2020-01-02 | Payer: PRIVATE HEALTH INSURANCE

## 2020-01-02 ENCOUNTER — Encounter: Admit: 2020-01-02 | Discharge: 2020-01-02 | Payer: PRIVATE HEALTH INSURANCE

## 2020-01-02 DIAGNOSIS — K31811 Angiodysplasia of stomach and duodenum with bleeding: Secondary | ICD-10-CM | POA: Diagnosis not present

## 2020-01-02 DIAGNOSIS — K766 Portal hypertension: Secondary | ICD-10-CM | POA: Diagnosis not present

## 2020-01-02 DIAGNOSIS — D509 Iron deficiency anemia, unspecified: Secondary | ICD-10-CM | POA: Diagnosis not present

## 2020-01-02 DIAGNOSIS — E119 Type 2 diabetes mellitus without complications: Secondary | ICD-10-CM | POA: Diagnosis not present

## 2020-01-02 DIAGNOSIS — F329 Major depressive disorder, single episode, unspecified: Secondary | ICD-10-CM | POA: Diagnosis not present

## 2020-01-02 DIAGNOSIS — M797 Fibromyalgia: Secondary | ICD-10-CM | POA: Diagnosis not present

## 2020-01-02 DIAGNOSIS — E785 Hyperlipidemia, unspecified: Secondary | ICD-10-CM | POA: Diagnosis not present

## 2020-01-02 DIAGNOSIS — Z885 Allergy status to narcotic agent status: Secondary | ICD-10-CM | POA: Diagnosis not present

## 2020-01-02 DIAGNOSIS — E039 Hypothyroidism, unspecified: Secondary | ICD-10-CM | POA: Diagnosis not present

## 2020-01-02 DIAGNOSIS — E7800 Pure hypercholesterolemia, unspecified: Secondary | ICD-10-CM | POA: Diagnosis not present

## 2020-01-02 DIAGNOSIS — Z794 Long term (current) use of insulin: Secondary | ICD-10-CM | POA: Diagnosis not present

## 2020-01-02 DIAGNOSIS — K449 Diaphragmatic hernia without obstruction or gangrene: Secondary | ICD-10-CM | POA: Diagnosis not present

## 2020-01-02 DIAGNOSIS — K3189 Other diseases of stomach and duodenum: Secondary | ICD-10-CM | POA: Diagnosis not present

## 2020-01-02 DIAGNOSIS — Z79899 Other long term (current) drug therapy: Secondary | ICD-10-CM | POA: Diagnosis not present

## 2020-01-02 DIAGNOSIS — M810 Age-related osteoporosis without current pathological fracture: Secondary | ICD-10-CM | POA: Diagnosis not present

## 2020-01-02 DIAGNOSIS — K760 Fatty (change of) liver, not elsewhere classified: Secondary | ICD-10-CM | POA: Diagnosis not present

## 2020-01-02 DIAGNOSIS — K31819 Angiodysplasia of stomach and duodenum without bleeding: Secondary | ICD-10-CM | POA: Diagnosis not present

## 2020-01-02 DIAGNOSIS — I10 Essential (primary) hypertension: Secondary | ICD-10-CM | POA: Diagnosis not present

## 2020-01-02 DIAGNOSIS — I081 Rheumatic disorders of both mitral and tricuspid valves: Secondary | ICD-10-CM | POA: Diagnosis not present

## 2020-01-02 DIAGNOSIS — Z9049 Acquired absence of other specified parts of digestive tract: Secondary | ICD-10-CM | POA: Diagnosis not present

## 2020-01-02 MED ORDER — PANTOPRAZOLE 40 MG TABLET,DELAYED RELEASE
ORAL_TABLET | Freq: Two times a day (BID) | ORAL | 3 refills | 30.00000 days | Status: CP
Start: 2020-01-02 — End: 2020-02-01

## 2020-01-02 MED ORDER — PANTOPRAZOLE 40 MG TABLET,DELAYED RELEASE: 40 mg | tablet | Freq: Every day | 0 refills | 30 days | Status: AC

## 2020-01-02 NOTE — Unmapped (Signed)
Call placed to Casimiro Needle per Chikita's request regarding her pre liver txplt evals.  Casimiro Needle states he would like me to call him back at another time because Arian was in the middle of a GI procedure.

## 2020-01-02 NOTE — Unmapped (Signed)
PRE-PROCEDURE HISTORY AND PHYSICAL EXAM    Marilyn French presents for her scheduled UGI ENDO; W/BAND LIG ESOPH &/OR GASTRIC VARICES.    The indication for the procedure(s) is GAVE.    There have been no significant recent changes in the patient's medical status.    Past Medical History:   Diagnosis Date   ??? Cirrhosis (CMS-HCC)    ??? Depression    ??? Diabetes mellitus (CMS-HCC)    ??? Fibromyalgia    ??? GAVE (gastric antral vascular ectasia)    ??? History of transfusion 04/2019    reports frequent blood tranfusions   ??? HTN (hypertension)    ??? Hypercholesterolemia    ??? Hypothyroid    ??? NAFLD (nonalcoholic fatty liver disease)    ??? Osteoporosis      Past Surgical History:   Procedure Laterality Date   ??? APPENDECTOMY     ??? CHOLECYSTECTOMY     ??? IR TIPS  07/08/2018    IR TIPS 07/08/2018 Marilyn Gerlach, MD IMG VIR [facility]   ??? PR COLSC FLEXIBLE W/CONTROL BLEEDING ANY METHOD N/A 10/17/2019    Procedure: COLONOSCOPY, FLEXIBLE, PROXIMAL TO SPLENIC FLEXURE; DX, W/CONTROL OF BLEEDING;  Surgeon: Marilyn Husbands, MD;  Location: [facility];  Service: Gastroenterology   ??? PR RIGHT HEART CATH O2 SATURATION & CARDIAC OUTPUT Right 01/18/2018    Procedure: Right Heart Catheterization;  Surgeon: Marilyn Hind, MD;  Location: [facility] CATH;  Service: Cardiology   ??? PR UPPER GI ENDOSCOPY,CTRL BLEED N/A 03/05/2019    Procedure: UGI ENDOSCOPY; WITH CONTROL OF BLEEDING, ANY METHOD;  Surgeon: Marilyn Farmer, MD;  Location: GI [facility];  Service: Gastroenterology   ??? PR UPPER GI ENDOSCOPY,CTRL BLEED  04/04/2019    Procedure: UGI ENDOSCOPY; WITH CONTROL OF BLEEDING, ANY METHOD;  Surgeon: Marilyn Pupa, MD;  Location: GI [facility]URES [facility];  Service: Gastroenterology   ??? PR UPPER GI ENDOSCOPY,CTRL BLEED N/A 05/23/2019    Procedure: UGI ENDOSCOPY; WITH CONTROL OF BLEEDING, ANY METHOD;  Surgeon: Marilyn Pupa, MD;  Location: GI [facility]URES [facility];  Service: Gastroenterology   ??? PR UPPER GI ENDOSCOPY,CTRL BLEED N/A 06/13/2019    Procedure: UGI ENDOSCOPY; WITH CONTROL OF BLEEDING, ANY METHOD;  Surgeon: Marilyn Paras, MD;  Location: [facility];  Service: Gastroenterology   ??? PR UPPER GI ENDOSCOPY,CTRL BLEED N/A 07/11/2019    Procedure: UGI ENDOSCOPY; WITH CONTROL OF BLEEDING, ANY METHOD;  Surgeon: Marilyn Paras, MD;  Location: GI [facility]URES [facility];  Service: Gastroenterology   ??? PR UPPER GI ENDOSCOPY,CTRL BLEED N/A 09/16/2019    Procedure: UGI ENDOSCOPY; WITH CONTROL OF BLEEDING, ANY METHOD;  Surgeon: Marilyn Milch, MD;  Location: GI [facility]URES [facility];  Service: Gastrointestinal   ??? PR UPPER GI ENDOSCOPY,CTRL BLEED N/A 10/07/2019    Procedure: UGI ENDOSCOPY; WITH CONTROL OF BLEEDING, ANY METHOD;  Surgeon: Marilyn Mau, MD;  Location: GI [facility]URES [facility];  Service: Gastroenterology   ??? PR UPPER GI ENDOSCOPY,DIAGNOSIS N/A 03/03/2019    Procedure: UGI ENDO, INCLUDE ESOPHAGUS, STOMACH, & DUODENUM &/OR JEJUNUM; DX W/WO COLLECTION SPECIMN, BY BRUSH OR WASH;  Surgeon: Marilyn Bras, MD;  Location: GI [facility]URES [facility];  Service: Gastroenterology   ??? SALPINGOOPHORECTOMY         Allergies  Allergies   Allergen Reactions   ??? Ferumoxytol      Patient has severe back and chest pain when receiving FERAHEME infusions.   ??? [employer]  Lipid Microspheres] Muscle Pain     Pt experienced lower back spasm.    ??? Tramadol Hcl      WEAKNESS       Medications  bumetanide, busPIRone, (cholecalciferol (vitamin D3 25 mcg (1,000 units))), insulin ASPART, insulin glargine, insulin lispro, lactulose, levothyroxine, lidocaine-prilocaine, magnesium, (multivit with iron,minerals), oxygen-air delivery systems, pantoprazole, pediatric multivit-iron-min, (pen needle, diabetic), potassium chloride, rifAXIMin, rosuvastatin, sertraline, spironolactone, and zinc acetate    Physical Examination  Vitals:    01/02/20 1040   BP: 142/50   Pulse: 78   Resp: 23   Temp:    SpO2: 96%     Body mass index is 31.07 kg/m??.    Mental Status: AAOx3, thoughts organized     Lungs: Clear to auscultation, unlabored breathing     Heart: Regular rate and rhythm, normal S1 and S2, no murmur     Abdomen: Soft, non-tender, non-distended         ASSESSMENT AND PLAN  Ms. Marilyn French has been evaluated and deemed appropriate to undergo the planned UGI ENDO; W/BAND LIG ESOPH &/OR GASTRIC VARICES.    The patient, or her authorized representative, was provided a printed handout that explained the nature and benefits of the procedure(s), the most frequent risks, and alternatives, if any.  I personally reviewed this information with the patient, or her authorized representative, and answered all questions.

## 2020-01-05 ENCOUNTER — Other Ambulatory Visit: Payer: Self-pay

## 2020-01-05 ENCOUNTER — Encounter (HOSPITAL_COMMUNITY)
Admission: RE | Admit: 2020-01-05 | Discharge: 2020-01-05 | Disposition: A | Discharge status: Home / Self Care | Payer: PPO | Source: Ambulatory Visit | Attending: Internal Medicine | Admitting: Internal Medicine

## 2020-01-05 DIAGNOSIS — D509 Iron deficiency anemia, unspecified: Secondary | ICD-10-CM | POA: Diagnosis not present

## 2020-01-05 LAB — HEMOGLOBIN AND HEMATOCRIT, BLOOD
HCT: 24.6 % — ABNORMAL LOW (ref 36.0–46.0)
Hemoglobin: 7.1 g/dL — ABNORMAL LOW (ref 12.0–15.0)

## 2020-01-05 LAB — SAMPLE TO BLOOD BANK

## 2020-01-05 LAB — PREPARE RBC (CROSSMATCH)

## 2020-01-05 MED ORDER — HEPARIN SOD (PORK) LOCK FLUSH 100 UNIT/ML IV SOLN
500.0000 [IU] | Freq: Once | INTRAVENOUS | Status: AC
  Administered 2020-01-05: 500 [IU] via INTRAVENOUS

## 2020-01-06 ENCOUNTER — Encounter (HOSPITAL_COMMUNITY): Payer: Self-pay

## 2020-01-06 ENCOUNTER — Encounter (HOSPITAL_COMMUNITY)
Admission: RE | Admit: 2020-01-06 | Discharge: 2020-01-06 | Disposition: A | Discharge status: Home / Self Care | Payer: PPO | Source: Ambulatory Visit | Attending: Internal Medicine | Admitting: Internal Medicine

## 2020-01-06 DIAGNOSIS — D509 Iron deficiency anemia, unspecified: Secondary | ICD-10-CM | POA: Diagnosis not present

## 2020-01-06 MED ORDER — SODIUM CHLORIDE 0.9% IV SOLUTION: Freq: Once | INTRAVENOUS | Status: AC

## 2020-01-06 MED ORDER — HEPARIN SOD (PORK) LOCK FLUSH 100 UNIT/ML IV SOLN
500.0000 [IU] | Freq: Once | INTRAVENOUS | Status: AC
  Administered 2020-01-06: 11:00:00 500 [IU] via INTRAVENOUS

## 2020-01-06 NOTE — Unmapped (Unsigned)
[facility]  Psychiatry   Established Patient E&M Service - Outpatient       Assessment:    Demisha Nokes presents for follow-up evaluation. ***    Identifying Information:  Enora Trillo is a 64 y.o. female with a history of depression, anxiety, hypothyroidism, fibromyalgia, T2DM, HTN, HLD, NAFLD cirrhosis c/b portal hypertension, esophageal varices with GAVE and hepatic encephalopathy with upcoming TIPS procedure who is currently undergoing liver transplant evaluation. Patient presents today for evaluation of anxiety and depression.  ??  On initial evaluation, patient endorses many neurovegetative symptoms consistent with depression including poor energy, concentration, motivation, sleep, appetite, and memory deficits which are likely also caused by her ESLD and hx of hepatic encephalopathy. Patient also endorses significant worries and baseline anxiety consistent with generalized anxiety disorder. Patient reports some benefit from home Paxil for mood, but would likely benefit from addition of anxiolytic. Will initiate Buspar 5mg  BID with plan for slow titration as tolerated. Of note patient's Paxil was abruptly discontinued by her Gastroenterologist in 08/2018, she has since felt increasingly down and irritable even after the withdrawal window.  Will start patient on Zoloft with plan for titration, and consider adding Seroquel in the future.  ??  There are no contraindications to continuing with liver transplant evaluation.    Risk Assessment:  {PSY risk assessment 3:70986}   While future psychiatric events cannot be accurately predicted, the patient does not currently require acute inpatient psychiatric care and does not currently meet [facility] involuntary commitment criteria.      Plan:    Problem 1: Generalized Anxiety Disorder   Problem 2: Unspecified Depressive Disorder  Prior medication trials: Paxil (discontinued by GI December 2019)  Status of problem: {Status of problem:68359}  Interventions: --Zoloft 50 mg   --Buspar 10 mg BID  -- May consider Seroquel in future for antidepressant, anxiolytic and anti-delirium properties if Buspar ineffective    Problem 3: ***  Status of problem: {Status of problem:68359}  Interventions: ***    Problem 4: ***  Status of problem:  {Status of problem:68359}  Interventions: ***      ??  # T2DM - Hypothyroidism: Patient followed by endocrinologist, Dr. Talmage Nap  -- Continue home insulin and levothyroxine  -- F/up with Dr. Talmage Nap (next appt 10/4)    {Reminder- use.PSYPROBS to add additional problems.  This reminder will not appear in final note.:68361}    Psychotherapy provided:  {psychotherapy add-on documentation:53071}    Patient has been given this writer's contact information as well as the [facility] Psychiatry urgent line number. The patient has been instructed to call 911 for emergencies.    {Attestation for trainees - faculty do not need to make a choice here - this will drop out of note if no selection made:57443}      Subjective:    Chief complaint:  {Chief Complaint:53034}    Interval History: ***            Objective:      Mental Status Exam:  Appearance:    {PSY Appearance:23008}   Motor:   {PSY Motor:23010}   Speech/Language:    {PSY Speech/Lang:23011}   Mood:   {PSY Mood:23012}   Affect:   {PSY Affect:23013}   Thought process and Associations:   {PSY Thought Process revised:53234}   Abnormal/psychotic thought content:     {PSY Thought Content:23016}   Perceptual disturbances:     {PSY Perceptual Disturbances:23017}     Other:   ***     {Billing based  on time?  Drop .[id number] into the note.  This reminder will not appear in finished note.:73260}    {Video visit?:68362}    {A reminder that as of Sept 2020 the VIDEOTELEPHONEVISITDISCLAIM SmartPhrase must be inserted new (not copied-forward) into each virtual visit note.  This reminder will not appear in finished note.:71089}      Addison Lank, MD  01/06/2020

## 2020-01-07 LAB — BPAM RBC
Blood Product Expiration Date: 202105202359
ISSUE DATE / TIME: 202104200932
Unit Type and Rh: 9500

## 2020-01-07 LAB — TYPE AND SCREEN
ABO/RH(D): O NEG
Antibody Screen: NEGATIVE
Unit division: 0

## 2020-01-12 ENCOUNTER — Encounter (HOSPITAL_COMMUNITY)
Admission: RE | Admit: 2020-01-12 | Discharge: 2020-01-12 | Disposition: A | Discharge status: Home / Self Care | Payer: PPO | Source: Ambulatory Visit | Attending: Internal Medicine | Admitting: Internal Medicine

## 2020-01-12 ENCOUNTER — Other Ambulatory Visit: Payer: Self-pay

## 2020-01-12 DIAGNOSIS — D509 Iron deficiency anemia, unspecified: Secondary | ICD-10-CM | POA: Diagnosis not present

## 2020-01-12 LAB — SAMPLE TO BLOOD BANK

## 2020-01-12 LAB — HEMOGLOBIN AND HEMATOCRIT, BLOOD
HCT: 30.3 % — ABNORMAL LOW (ref 36.0–46.0)
Hemoglobin: 9 g/dL — ABNORMAL LOW (ref 12.0–15.0)

## 2020-01-12 MED ORDER — HEPARIN SOD (PORK) LOCK FLUSH 100 UNIT/ML IV SOLN
500.0000 [IU] | Freq: Once | INTRAVENOUS | Status: AC
  Administered 2020-01-12: 12:00:00 500 [IU] via INTRAVENOUS

## 2020-01-13 DIAGNOSIS — K7290 Hepatic failure, unspecified without coma: Principal | ICD-10-CM

## 2020-01-16 DIAGNOSIS — R609 Edema, unspecified: Secondary | ICD-10-CM | POA: Diagnosis not present

## 2020-01-16 DIAGNOSIS — E786 Lipoprotein deficiency: Secondary | ICD-10-CM | POA: Diagnosis not present

## 2020-01-19 ENCOUNTER — Encounter (HOSPITAL_COMMUNITY)
Admission: RE | Admit: 2020-01-19 | Discharge: 2020-01-19 | Disposition: A | Discharge status: Home / Self Care | Payer: PPO | Source: Ambulatory Visit | Attending: Internal Medicine | Admitting: Internal Medicine

## 2020-01-19 ENCOUNTER — Other Ambulatory Visit: Payer: Self-pay

## 2020-01-19 DIAGNOSIS — K7581 Nonalcoholic steatohepatitis (NASH): Secondary | ICD-10-CM | POA: Insufficient documentation

## 2020-01-19 LAB — HEMOGLOBIN AND HEMATOCRIT, BLOOD
HCT: 28.2 % — ABNORMAL LOW (ref 36.0–46.0)
Hemoglobin: 7.9 g/dL — ABNORMAL LOW (ref 12.0–15.0)

## 2020-01-19 LAB — SAMPLE TO BLOOD BANK

## 2020-01-19 MED ORDER — HEPARIN SOD (PORK) LOCK FLUSH 100 UNIT/ML IV SOLN
500.0000 [IU] | Freq: Once | INTRAVENOUS | Status: AC
  Administered 2020-01-19: 500 [IU] via INTRAVENOUS

## 2020-01-21 NOTE — Unmapped (Addendum)
Received referral to verify patients [employer] has been verified.      Patient has active coverage with [employer], including prescription drug coverage via [employer] [phone]    Authorization is [id number] for Liver transplant evaluation valid 01/09/20 to 04/08/20 (90 days). When ready to list refax another prior auth form for listing     Patient is financially cleared for Liver transplant evaluation

## 2020-01-26 ENCOUNTER — Encounter (HOSPITAL_COMMUNITY)
Admission: RE | Admit: 2020-01-26 | Discharge: 2020-01-26 | Disposition: A | Discharge status: Home / Self Care | Payer: PPO | Source: Ambulatory Visit | Attending: Internal Medicine | Admitting: Internal Medicine

## 2020-01-26 ENCOUNTER — Encounter (HOSPITAL_COMMUNITY): Payer: Self-pay

## 2020-01-26 ENCOUNTER — Other Ambulatory Visit: Payer: Self-pay

## 2020-01-26 DIAGNOSIS — K7581 Nonalcoholic steatohepatitis (NASH): Secondary | ICD-10-CM | POA: Diagnosis not present

## 2020-01-26 LAB — HEMOGLOBIN AND HEMATOCRIT, BLOOD
HCT: 28.7 % — ABNORMAL LOW (ref 36.0–46.0)
Hemoglobin: 8 g/dL — ABNORMAL LOW (ref 12.0–15.0)

## 2020-01-26 LAB — SAMPLE TO BLOOD BANK

## 2020-01-26 MED ORDER — HEPARIN SOD (PORK) LOCK FLUSH 100 UNIT/ML IV SOLN
500.0000 [IU] | Freq: Once | INTRAVENOUS | Status: AC
  Administered 2020-01-26: 11:00:00 500 [IU] via INTRAVENOUS

## 2020-02-02 ENCOUNTER — Other Ambulatory Visit: Payer: Self-pay

## 2020-02-02 ENCOUNTER — Encounter (HOSPITAL_COMMUNITY)
Admission: RE | Admit: 2020-02-02 | Discharge: 2020-02-02 | Disposition: A | Discharge status: Home / Self Care | Payer: PPO | Source: Ambulatory Visit | Attending: Internal Medicine | Admitting: Internal Medicine

## 2020-02-02 DIAGNOSIS — H2513 Age-related nuclear cataract, bilateral: Secondary | ICD-10-CM | POA: Diagnosis not present

## 2020-02-02 DIAGNOSIS — E113391 Type 2 diabetes mellitus with moderate nonproliferative diabetic retinopathy without macular edema, right eye: Secondary | ICD-10-CM | POA: Diagnosis not present

## 2020-02-02 DIAGNOSIS — H40013 Open angle with borderline findings, low risk, bilateral: Secondary | ICD-10-CM | POA: Diagnosis not present

## 2020-02-02 DIAGNOSIS — K7581 Nonalcoholic steatohepatitis (NASH): Secondary | ICD-10-CM | POA: Diagnosis not present

## 2020-02-02 DIAGNOSIS — E113312 Type 2 diabetes mellitus with moderate nonproliferative diabetic retinopathy with macular edema, left eye: Secondary | ICD-10-CM | POA: Diagnosis not present

## 2020-02-02 LAB — SAMPLE TO BLOOD BANK

## 2020-02-02 LAB — HEMOGLOBIN AND HEMATOCRIT, BLOOD
HCT: 29.8 % — ABNORMAL LOW (ref 36.0–46.0)
Hemoglobin: 8.5 g/dL — ABNORMAL LOW (ref 12.0–15.0)

## 2020-02-03 ENCOUNTER — Institutional Professional Consult (permissible substitution)
Admit: 2020-02-03 | Discharge: 2020-02-04 | Payer: PRIVATE HEALTH INSURANCE | Attending: Nutritionist | Primary: Nutritionist

## 2020-02-03 ENCOUNTER — Institutional Professional Consult (permissible substitution): Admit: 2020-02-03 | Discharge: 2020-02-04 | Payer: PRIVATE HEALTH INSURANCE

## 2020-02-03 ENCOUNTER — Telehealth
Admit: 2020-02-03 | Discharge: 2020-02-04 | Payer: PRIVATE HEALTH INSURANCE | Attending: Student in an Organized Health Care Education/Training Program | Primary: Student in an Organized Health Care Education/Training Program

## 2020-02-03 ENCOUNTER — Ambulatory Visit
Admit: 2020-02-03 | Discharge: 2020-02-04 | Payer: PRIVATE HEALTH INSURANCE | Attending: Health Service | Primary: Health Service

## 2020-02-03 DIAGNOSIS — F3342 Major depressive disorder, recurrent, in full remission: Principal | ICD-10-CM

## 2020-02-03 DIAGNOSIS — F411 Generalized anxiety disorder: Principal | ICD-10-CM

## 2020-02-03 MED ORDER — BUSPIRONE 10 MG TABLET
ORAL_TABLET | Freq: Two times a day (BID) | ORAL | 0 refills | 60.00000 days | Status: CP
Start: 2020-02-03 — End: ?

## 2020-02-03 MED ORDER — SERTRALINE 50 MG TABLET
ORAL_TABLET | Freq: Every day | ORAL | 0 refills | 60.00000 days | Status: CP
Start: 2020-02-03 — End: ?

## 2020-02-03 NOTE — Unmapped (Signed)
Follow-up instructions:  -- no medication changes made at today's visit   -- Please continue taking your medications as prescribed for your mental health.   -- Do not make changes to your medications, including taking more or less than prescribed, unless under the supervision of your physician. Be aware that some medications may make you feel worse if abruptly stopped  -- Please refrain from using illicit substances, as these can affect your mood and could cause anxiety or other concerning symptoms.   -- Seek further medical care for any increase in symptoms or new symptoms such as thoughts of wanting to hurt yourself or hurt others.     **IF YOU DO NOT HAVE MYCHART, SEE INSTRUCTIONS BELOW FOR ACTIVATING.     Contact info:  Life-threatening emergencies: call 911 or go to the nearest ER for medical or psychiatric attention.     Issues that need urgent attention but are not life threatening: call the clinic outpatient frontdesk at [phone] for assistance.     Non-urgent routine concerns, questions, and refill requests: please leave me a voicemail at [phone] and I will get back to you within 2 business days.     Regarding appointments:  - If you need to cancel your appointment, we ask that you call [phone] at least 24 hours before your scheduled appointment.  - If for any reason you arrive 15 minutes later than your scheduled appointment time, you may not be seen and your visit may be rescheduled.  - Please remember that we will not automatically reschedule missed appointments.  - If you miss two (2) appointments without letting us know in advance, you will likely be referred to a provider in your community.  - We will do our best to be on time. Sometimes an emergency will arise that might cause your clinician to be late. We will try to inform you of this when you check in for your appointment. If you wait more than 15 minutes past your appointment time without such notice, please speak with the front desk staff.    In the event of bad weather, the clinic staff will attempt to contact you, should your appointment need to be rescheduled. Additionally, you can call the Patient Weather Line [phone] for system-wide clinic status    For more information and reminders regarding clinic policies (these were provided when you were admitted to the clinic), please ask the front desk.

## 2020-02-03 NOTE — Unmapped (Signed)
On call page received from [name] - attempted to return call X 3 , unable to leave VM

## 2020-02-03 NOTE — Unmapped (Addendum)
[facility]  Psychiatry   Established Patient E&M Service - Outpatient       Assessment:    Marilyn French presents for follow-up evaluation. Patient was lost to follow-up for approximately one year secondary to ongoing COVID-19 pandemic scheduling as well as no-shows/late cancellations secondary to ongoing sequelae from liver failure (last appointment required late cancellation as she was actively receiving RBC transfusion). Upon evaluation, patient had minimal elaboration of symptoms which made full assessment of status of depression and anxiety difficult, however, appears that current medication regimen has provided benefit to the patient. Due to stability in psychotropic regimen and limited engagement with psychiatry, offered patient potential of transitioning psychotropic prescribing to liver transplant team, which she expressed preference for. Should psychiatric consultation be required again in future, patient is welcome to return to [facility] psychiatry.     Identifying Information:  Marilyn French is a 64 y.o. female with a history of depression, anxiety, hypothyroidism, fibromyalgia, T2DM, HTN, HLD, NAFLD cirrhosis c/b portal hypertension, esophageal varices with GAVE and hepatic encephalopathy with upcoming TIPS procedure who is currently undergoing liver transplant evaluation. Patient presents today for evaluation of anxiety and depression.  ??  On initial evaluation, patient endorsed many neurovegetative symptoms consistent with depression including poor energy, concentration, motivation, sleep, appetite, and memory deficits which are likely also caused by her ESLD and hx of hepatic encephalopathy. Patient also endorsed significant worries and baseline anxiety consistent with generalized anxiety disorder. Patient reported some benefit historically from home Paxil for mood (no longer on this medication), and thus started Zoloft and Buspar and titrated to current dose to good effect.   ??  There are no contraindications to continuing with liver transplant evaluation.    Risk Assessment:  An assessment of suicide and violence risk factors was performed as part of this evaluation and is not significantly changed from the last visit.   While future psychiatric events cannot be accurately predicted, the patient does not currently require acute inpatient psychiatric care and does not currently meet [facility] involuntary commitment criteria.      Plan:    Problem 1: Generalized Anxiety Disorder   Problem 2: Unspecified Depressive Disorder  Prior medication trials: Paxil (discontinued by GI December 2019)  Status of problem: chronic and stable  Interventions:   --Zoloft 50 mg   --Buspar 10 mg BID  -- May consider Seroquel in future for antidepressant, anxiolytic and anti-delirium properties if Buspar ineffective    Problem 3: Covid vaccine  Status of problem: new problem to this provider  Interventions:   -received J&J vaccine 12/29/19    Problem #: initial insomnia  Status of problem:  new problem to this provider  Interventions:   -counseled on sleep hygiene  ---remove clock from room  ---regular beditme            Psychotherapy provided:  No billable psychotherapy service provided.    Patient has been given this writer's contact information as well as the [facility] Psychiatry urgent line number. The patient has been instructed to call 911 for emergencies.    Patient and plan of care were discussed with the Attending MD,Rebekah Elita Boone, MD, who agrees with the above statement and plan.      Subjective:    Chief complaint:  Follow-up psychiatric evaluation for anxiety, depression    Interval History:     Reports that she is good, the medications are fine that he's [Dr. Colin Benton, prior resident psychiatrist] got me on and the only thing is  I don't get many pills, asking for longer duration of script to prevent having to frequently request refills. She states that her mood is fine, anxiety is fine, and that overall everything is fine. She states that she is feeling stronger, being able to move and get out more has helped her mood. She cites her new great grandbaby as major motivator of health.     Reports that she has poor sleep secondary to several bowel movements secondary to lactulose dosing and urination 2/2 diuretics through the night. Has initial insomnia secondary to ruminations. Declines increase in Zoloft to target ongoing ruminations with sleep, because she feels that she is doing fine.     Desires to follow up with liver instead of psychiatry.       Objective:      Mental Status Exam:  Appearance:    Appears stated age, Well nourished, Well developed and Clean/Neat   Motor:   No abnormal movements   Speech/Language:    Normal rate, volume, tone, fluency and Language intact, well formed   Mood:   fine    Affect:   Calm, Euthymic and Mood congruent   Thought process and Associations:   difficult to assess due to limited elaboration    Abnormal/psychotic thought content:     Denies SI, HI, self harm, delusions, obsessions, paranoid ideation, or ideas of reference   Perceptual disturbances:     Denies auditory and visual hallucinations, behavior not concerning for response to internal stimuli     Other:   insight: fair; judgment: fair         Visit was completed by video (or phone) and the appropriate disclaimer has been included below.          I spent 14 minutes on the real-time audio and video with the patient on the date of service. I spent an additional 10 minutes on pre- and post-visit activities on the date of service.     The patient was physically located in [place] or a state in which I am permitted to provide care. The patient and/or parent/guardian understood that s/he may incur co-pays and cost sharing, and agreed to the telemedicine visit. The visit was reasonable and appropriate under the circumstances given the patient's presentation at the time.    The patient and/or parent/guardian has been advised of the potential risks and limitations of this mode of treatment (including, but not limited to, the absence of in-person examination) and has agreed to be treated using telemedicine. The patient's/patient's family's questions regarding telemedicine have been answered.     If the visit was completed in an ambulatory setting, the patient and/or parent/guardian has also been advised to contact their provider???s office for worsening conditions, and seek emergency medical treatment and/or call 911 if the patient deems either necessary.    Addison Lank, MD  02/03/2020

## 2020-02-03 NOTE — Unmapped (Signed)
Outpatient Adult Nutrition-Transplant Evaluation    Referring Provider: Janyth Pupa    Reason for Referral: Organ Transplant Evaluation  Liver    PMH:   Patient Active Problem List   Diagnosis   ??? Abnormal echocardiogram   ??? NAFLD (nonalcoholic fatty liver disease)   ??? Cirrhosis (CMS-HCC)   ??? GAVE (gastric antral vascular ectasia)   ??? Hypothyroid   ??? Chronic blood loss anemia   ??? Generalized anxiety disorder   ??? Hepatic encephalopathy (CMS-HCC)   ??? Iron deficiency anemia   ??? Type 2 diabetes mellitus without complication, with long-term current use of insulin (CMS-HCC)   ??? Pulmonary hypertension (CMS-HCC)   ??? Aspiration into airway   ??? Thrombocytopenia (CMS-HCC)   cirrhosis 2/2 NAFLD    Anthropometrics:   Estimated body mass index is 31.07 kg/m?? as calculated from the following:    Height as of 01/02/20: 162.6 cm (5' 4).    Weight as of 01/02/20: 82.1 kg (181 lb).  IBW: 52.2 kg  %IBW: ~174%  ABW: 61.8 kg  Dry wt: same    Weight History: Per patient weight has been stable at ~177- 180 lbs for the past few weeks   Wt Readings from Last 10 Encounters:   01/02/20 82.1 kg (181 lb)   12/22/19 82 kg (180 lb 12.8 oz)   11/03/19 80.5 kg (177 lb 6.4 oz)   10/17/19 78 kg (172 lb)   08/12/19 78.6 kg (173 lb 3.2 oz)   07/11/19 87.3 kg (192 lb 7.4 oz)   06/30/19 91.7 kg (202 lb 3.2 oz)   06/27/19 99.2 kg (218 lb 11.1 oz)   05/23/19 92.1 kg (203 lb)   04/04/19 83.9 kg (185 lb)     Historical [facility] Fraility Index  11/03/19: 4.34, pre-frail     Nutrition-Focused Physical Findings:   Unable to complete at this time due to limiting interactions between patient and provider during infectious outbreak in high risk population.    Relevant Medications, Herbs, Supplements include: Bumex, Vit D3, insulin, lactulose PRN (aims for 3-5 BMs per day), synthroid, mag-ox, protonix, Flinstone's complete, KLOR-CON, spironolactone, zinc acetate, rifaximin    Relevant Labs: HbA1c 4.7     Patient reports her blood sugar has come down, she reports they usually run ~149     Physical Activity: Patient has stopped physical therapy, as she feels stronger and can walk further without her cane. She reports overall feeling better now that her hemoglobin has been stable.     Dietary Restrictions, Intolerances: low sodium (2000 mg), consistent CHO. NKFA, occasional lactose intolerance.     Allergies:   Allergies   Allergen Reactions   ??? Ferumoxytol      Patient has severe back and chest pain when receiving FERAHEME infusions.   ??? Definity [Perflutren Lipid Microspheres] Muscle Pain     Pt experienced lower back spasm.    ??? Tramadol Hcl      WEAKNESS     Hunger and Satiety: patient reports her appetite has been pretty good     24-Hour recall/usual intake:   1st - 2 eggs with cheese or oatmeal + peanut butter   Snack - none   2nd - soup or leftovers from the night before   Snack - cut up vegetables or Chobani greek yogurt   3rd -  alfredo pasta with chicken and broccoli or another starch with chicken and broccoli   Snack - piece of fruit every once in a while  Beverages - True  lemon, water (2L fluid restriction), sometimes has orange juice; Premier Protein or Boost High Protein if not eating well- no more than 1 per day    Nutrition History: Patient endorses improved appetite & intake since last transplant RD visit in February 2021. She has started eating lunch and is drinking an oral nutrition supplement when unable to eat a meal. No vomiting but occasional nausea with certain foods. Reflux controlled with protonix. Metallic taste has resolved. No greasy or oily BMs; typically has 3 per day with lactulose.      Estimated Needs:   Estimated Energy Needs: 1950- 2340 kcal/day (25- 30 kcal/kg at 78 kg)  Estimated Protein Needs:  95- 115 g protein/day (1.2-1.5 g/kg at 78 kg)  Estimated CHO Needs: 208-243 g CHO/day (60 g per meal, 15 per snack)  Estimated Fluid Needs:  per MD    Nutrition Assessment: Per pt report and diet recall, intake has improved and is likely close to meeting nutrient needs. Dietary behaviors have also improved and are now more appropriate for cirrhosis nutrition therapy. Patient no longer skips meals, and if she is not hungry she will instead drink a protein shakes.  She reports that her husband has started cooking foods without sodium, and has decreased intake of frozen convenience meals. Reiterated the importance of eating regular meals and snacks (every ~2-3 hrs) and incorporating protein-rich food into each.   Frailty assessment unable to be repeated given nature of a phone visit, but patient reports continuing to feel stronger and no longer needs PT.  Patient reports relatively stable weight, and has not had swelling/edema recently. She reports feeling so much better now that her hemoglobin is normal.     Nutrition Assessment for Transplant: The patient has the following nutrition concerns for transplant listing: none, pt with improved intake. No absolute contraindication based on selection committee policy but will be discussed with the team.    Malnutrition Assessment using AND/ASPEN Clinical Characteristics:  Unable to assess given inability to perform nutrition focused physical exam     Nutrition Education: transplant goals and expectations, cirrhosis nutrition therapy    Nutrition Goals:   1. Meet estimated daily needs.  2. Normal vitamin levels  3. Balanced macronutrient intake  4. Maintain hemoglobin A1c <7%    Interventions:   1. Adhere to 2gm Na+, high protein diet  2. Continue to not skip meals, eat at least 4x per day   3. Pair protein w/ carbohydrates  --- greek yogurt, boiled eggs, peanut butter and graham crackers, almond butter w/ english muffin  4. Continue exercising at least 150 minutes per week     Materials Provided were: Sent MyChart message including: Ensure coupon    Follow-up: with yearly evaluation; more frequently PRN    I am located on-site and the patient is located off-site for this visit.          I spent 21 minutes on the phone with the patient on the date of service. I spent an additional 31 minutes on pre- and post-visit activities on the date of service.     The patient was physically located in [place] or a state in which I am permitted to provide care. The patient and/or parent/guardian understood that s/he may incur co-pays and cost sharing, and agreed to the telemedicine visit. The visit was reasonable and appropriate under the circumstances given the patient's presentation at the time.    The patient and/or parent/guardian has been advised of the potential risks and  limitations of this mode of treatment (including, but not limited to, the absence of in-person examination) and has agreed to be treated using telemedicine. The patient's/patient's family's questions regarding telemedicine have been answered.     If the visit was completed in an ambulatory setting, the patient and/or parent/guardian has also been advised to contact their provider???s office for worsening conditions, and seek emergency medical treatment and/or call 911 if the patient deems either necessary.      Lanelle Bal, RD, LDN  Abdominal Transplant Dietitian   Pager: [phone]

## 2020-02-03 NOTE — Unmapped (Signed)
**THIS PATIENT WAS NOT SEEN IN PERSON TO MINIMIZE POTENTIAL SPREAD OF COVID-19, PROTECT PATIENTS/PROVIDERS, AND REDUCE PPE UTILIZATION.**    I spent 75 minutes on the phone with the patient on the date of service. I spent an additional 20 minutes on pre- and post-visit activities on the date of service.     The patient was physically located in [place] or a state in which I am permitted to provide care. The patient and/or parent/guardian understood that s/he may incur co-pays and cost sharing, and agreed to the telemedicine visit. The visit was reasonable and appropriate under the circumstances given the patient's presentation at the time.    The patient and/or parent/guardian has been advised of the potential risks and limitations of this mode of treatment (including, but not limited to, the absence of in-person examination) and has agreed to be treated using telemedicine. The patient's/patient's family's questions regarding telemedicine have been answered.     If the visit was completed in an ambulatory setting, the patient and/or parent/guardian has also been advised to contact their provider???s office for worsening conditions, and seek emergency medical treatment and/or call 911 if the patient deems either necessary.      PATIENT NAME: Marilyn French French                                   MR#: [id number]                   DOB: Jun 10, 1956  ??  ??  [facility]  CONFIDENTIAL SOCIAL WORK  LIVER TRANSPLANT INITIAL ASSESSMENT   ??  DATE OF EVALUATION: 02/03/20  ??  INFORMANTS: Patient/Marilyn French French, spouse/Marilyn French French   ??  PREFERRED LANGUAGE: English   ??  PRESENTING MEDICAL PROBLEMS AND RELEVANT HISTORY:   Marilyn French French is a 64 y.o. Caucasian, female with a history of Cirrhosis (CMS-HCC), Depression, Fibromyalgia, GAVE (gastric antral vascular ectasia), HTN (hypertension), Hypercholesterolemia, Hypothyroid, NAFLD (nonalcoholic fatty liver disease), and Osteoporosis.    Patient has been hospitalized multiple times thus far in 2021; she is not aware of what her MELD score is currently. Per Epic it is 18.   ??  TRANSPLANT PHASE:  Referral   TRANSPLANT STATUS:   Active  ??  Marilyn French French presents today for an updated liver transplant evaluation.    ??  FUNCTIONAL STATUS:   Marilyn French French presents as requiring assistance with all personal ADLs, including bathing, dressing, cooking, and household chores. Patient is not driving. She reports that she gets ongoing infections in her left eye, has a 'bad knee', and HE.      Current use of DME: uses shower chair when needed; has cane and does not use it on a regular basis   Current limitations:  sporadic HE; requiring multiple blood transfusions on a regular basis   Current side effects 2/2 ESLD: sporadic HE; some fatigue    Current activity level: able to complete some household chores   Hobbies/Interests: use of the computer, Marilyn French  ??  UNDERSTANDING OF MEDICAL CONDITION AND TRANSPLANT:   Marilyn French French and family attended the Transplant Orientation class on 12/25/17 in [place], [place].    ??  Pt understands transplant process: well  Participated in TeachBack method: well  Information recall: hospitalization, recovery, caregiver expectations  Education provided: follow up appointments, caregiver expectations, MELD score  ??  Briefly reviewed the evaluation and listing process.  Reviewed the potential surgical complications  of transplant such as but not limited to bleeding, blood clots, hernia, infection, rejection and death.  In addition, patient informed of the potential psychological complications of chronic illness such as depression, anxiety, guilt and PTSD and advised that it is not uncommon for patients with a previous history of such to experience an increase in symptoms should they have a decline in their health.  Marilyn French French voiced her understanding.   ??  Patient verbalizes understanding that NAFLD caused her liver disease.  Last MELD score is 18.  ??  COGNITIVE FUNCTIONING/HEALTH LITERACY: Marilyn French French does  seem cognitively intact. She reports sporadic/ongoing memory or cognitive concerns related to her health. She denies history of learning difficulties with short attention span and learns best by visual and Marilyn French. There were not any concerns for health literacy.   ??  REALM SCORE=no risk of low health literacy  ??  ATTITUDE ABOUT TRANSPLANT:   Patient desires transplant: yes  Expectations: live longer; spend time with her new great grand daughter   Fears/Concerns: denies  ??  LIVING SITUATION AND FAMILY/SOCIAL SUPPORTS:   Citizenship Status: [place] Citizen  Social History: originally from [place], [place]  Marital Status: married since 1975  Lives with: spouse/Marilyn French   Children/Dependents:  41/Marilyn French and 46/Marilyn French daughters and two grandchildren; new great-grand-daughter  Extended family: sister/Marilyn French French; has several other siblings living but 'I can't count on them... they are older than me'; parents deceased   ??  Marilyn French French reports feeling well supported at home by her friends and family.  ??  COMPLIANCE HISTORY:   Medication list available: none available  Problems obtaining medications:                  Current: denies                Past: denies  Problems organizing medications: denies  Diet regime: admits in following low sodium and DM diets  Barriers to attending medical care: denies  ??  SUBSTANCE HISTORY:    Cigarette smoking:                Current: denies                Past: denies  Other tobacco/nicotine use:                Current: denies                Past: denies  Alcohol use:                Current: denies                Past: denies  Illicit drug use:                Current: denies                Past: denies  ??  CHRONIC PAIN HISTORY: denies  Regular use of pain medications: denies                Medications/Prescriber: na  Involvement with pain clinic:  denies  ??  LEGAL HISTORY:   Marilyn French French denies current or past history of legal issues, including jail/probation time, litigation, bankruptcy, gambling, or any other legal problems.   ??  A review of [place] DPS Offender Public Information website revealed no history of criminal charges.  ??  PSYCHIATRIC HISTORY:   Current issues: admits to ongoing depression and anxiety; however, she feels that her symptoms have  been stable over the past year   Past issues: admits to being diagnosed in her mid 73's   Medications:                 Current: admits to buspar and certraline; reports feeling stable                 Past: several psychotropic medications over the years   Therapy:                 Current: denies                Past: admits to seeking counseling in late 1990's for individual issue and feels that it helped  SI/HI:                Current: denies                Past: denies  Hospitalizations:                Current: denies                Past: denies  Loss/Trauma/Violence:                Current: denied                 Past: issues with mother from childhood and describes it as 'over with' and was not inclined to elaborate   ??  Do you tend to make impulsive decisions that lead to problems in your relationships, work, finances, etc.? Denied   ??  Have you ever felt that you've had an over-abundant amount of energy or decreased need for sleep?  denies  ??  Have you blacked out or forgotten parts of your life? denies  ??  History or current hallucinations/delusions? Denies   ??  GAD-7 Score: GAD-7 Score:  Clinic Collected GAD-7 Total Score: 0   ??  PHQ-9 Score: PHQ-9 Score:  PHQ-9 TOTAL SCORE: 0    ??  They agreed to discuss any changes in the patient's mood or behavior with her medical team.  ??  COPING STYLE:   Current Stressors: denied   Coping Skills/Mechanisms: focuses on self and talks to Marilyn French French; spend time with family and new great grand baby   ??  ADVANCE DIRECTIVES:   Current AD/HCPOA: denies                On file with [place]:  denies  Desired healthcare agent(s): spouse/Marilyn French   ??  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  FINANCIAL RESOURCES:   Current income sources: SSDI for patient; Marilyn French French/spouse is retired   Environmental education officer expenses: customary expenses  Risk of losing home/transportation: denies  Hx of significant debts: denied   Current income meets basic needs: Yes  Fundraising efforts: discussed and provided resources   ??  MEDICAL/PRESCRIPTION COVERAGE:   Insurance:                 Primary Coverage:  [employer]                 Secondary Coverage: denies                Tertiary Coverage: n/a  Problems with access/coverage of medical care:  denies  ??  ACCESS TO TRANSPORTATION:    Patient drives: denies; vision issues, ongoing HE   Access to reliable transportation: yes  Other sources of transportation:  Marilyn French French/spouse or adult daughters; Marilyn French/sister, nephew   Dialysis transportation: n/a  ??  RELIGIOUS/SPIRITUAL AFFILIATION:   Patient identifies as: denies  Impact on healthcare: denies  ??  HOME HEALTH/DME AGENCY:   Current: denied   Past: admits to oxygen via [facility]; HH PT following an admission in 2020   Access to the following DME (not in current use): bedside commode, FWW   Home Modifications: Safety bars in shower/bath  ??  PLAN FOR TRANSPLANTATION:   Discussed expectations for pre/post caregiving needs with  patient and family.  Discussed the projected length of hospitalization , need for 24 hour supervision at the time of discharge, inability to drive for a 2-3 month period, need for lab work three times a week for the first two months after transplant and less often thereafter, need for weekly follow up at [facility] for the first month after transplant and less often thereafter and the lifetime need for medication and compliance in all areas.   ??  Discussed importance of having alternative/secondary caregivers in the event that primary support people are not available at the time of transplant.  Discussed that patients with any mobility issues prior to transplant could need more physical assistance after abdominal surgery and/or could be at a higher risk of becoming deconditioned more quickly.  They indicated that they understood.    ??  Current Plan: Ms. Krul continues to identify her spouse/Marilyn French French as her primary caregiver. Currently, he is actively involved in her care due to ongoing weakness and HE. Marilyn French French is retired and is able to continue to meet the needs of his wife/patient. Their eldest daughter, Marilyn French French doesn't work and lives in [facility] and has already been assisting especially during the patient's multiple admissions to date. Lastly, the have a nephew down the street that can assist with transportation as well as their other daughter/Marilyn French   ??  Primary Support: Marilyn French French  Relationship to patient: spouse  Age/DOB: 50  Education Level: 2 years of college   Employment: retired  Health: denies  Support limitations: none reported  Support strengths: historical caregiver, valid driver's license, access to reliable vehicle, comfortable driving to [facility], and lives in the home  ??  Back-up Support(s): Marilyn French French, Economist,  Marilyn French/sister (very limited due to age), nephew for transportation (not present)  ??  TRAVEL TIME TO HOSPITAL: Approximately 1.5 hours via car to [facility].  ??  EDUCATION: Verbal education on the following topics provided to patient and family:  Advance Care Planning  Fundraising for Transplant-related expenses  Long-term financial and vocational planning  Expectations for support planning both pre- and post-transplant  Common experience for LIVER patients to have depression and/or anxiety   Transplant social worker availability throughout transplant process  ??  Written education or resource material provided on the following topics:  Administrator, sports of Attorney Forms  [employer] and other financial resources  ??  Ms. Trimarco and family indicated that they understood all education provided and did ask appropriate questions.   ??  COLLATERAL CONTACT: Patient's spouse/Marilyn French French were present for the duration of interview and validated all information, including post-transplant support plan.   ??  ASSESSMENT AND RECOMMENDATIONS: Saquoia Sianez is a 64 y.o. Caucasian, female with a history of Cirrhosis (CMS-HCC), Depression, Fibromyalgia, GAVE (gastric antral vascular ectasia), HTN (hypertension), Hypercholesterolemia, Hypothyroid, NAFLD (nonalcoholic fatty liver  disease), and Osteoporosis.     She presents today for annual psychosocial liver transplant update. Her current MELD is 18.     **It is highly recommended that her spouse/Marilyn French French or another family member be present with the patient during consultation due to her ongoing HE.**     Although Ms. Valiente reports overall improvement in her medical and mental health since initial consultations in 2019, she continues to report needing assistance with most ADL's and it is advised that this be closely monitored by the medical team. Patient has been on disability from a nursing career since 2007. They denied any housing, insurance, financial or transportation issues.     She reports stability regarding her mental health and reports adherence to her psychotropic medications; Epic records confirm this report from patient. Lifetime denial of any substance use/abuse.  ??  Patient continues to demonstrate stability regarding her caregiving plan. Marilyn French French/spouse remains actively involved in her care and intends on continuing to be available for all identified needs. One adult daughter, Marilyn French French that resides in [place], is actively involved in assisting with housing and transportation when patient has needed to be admitted or have testing done. Marilyn French French does not work and from patient reports, she has a very flexible schedule. Lastly, the identify their other daughter, Marilyn French French along with a nephew and patient's sister that will be assisting as needed.  ??  At this time, this CSW cannot identify any psychosocial barriers that would impact consideration for the transplant active listing.      SIPAT: 16; good candidate   ??  Recommendations:   1. Consider fundraising   2. Annual follow-up to maintain record and provide continued support and encouragement.  ??  Final decision regarding listing status is based upon committee review at selection meeting.  ??  Carole Binning, LCSWA   Clinical Social Worker  [facility]  Completed:  02/10/20

## 2020-02-03 NOTE — Unmapped (Addendum)
TFC spoke with patient, Marilyn French, to discuss insurance benefits related to Liver transplant. [name] verbalized understanding of financial agreement for transplant.    As part of this consultation Shylah Saldana was advised to:  Consider fundraising to help with transplant expenses

## 2020-02-09 ENCOUNTER — Other Ambulatory Visit: Payer: Self-pay

## 2020-02-09 ENCOUNTER — Encounter (HOSPITAL_COMMUNITY)
Admission: RE | Admit: 2020-02-09 | Discharge: 2020-02-09 | Disposition: A | Discharge status: Home / Self Care | Payer: PPO | Source: Ambulatory Visit | Attending: Internal Medicine | Admitting: Internal Medicine

## 2020-02-09 DIAGNOSIS — K7581 Nonalcoholic steatohepatitis (NASH): Secondary | ICD-10-CM | POA: Diagnosis not present

## 2020-02-09 LAB — SAMPLE TO BLOOD BANK

## 2020-02-09 LAB — HEMOGLOBIN AND HEMATOCRIT, BLOOD
HCT: 28.7 % — ABNORMAL LOW (ref 36.0–46.0)
Hemoglobin: 7.8 g/dL — ABNORMAL LOW (ref 12.0–15.0)

## 2020-02-09 MED ORDER — HEPARIN SOD (PORK) LOCK FLUSH 100 UNIT/ML IV SOLN
500.0000 [IU] | Freq: Once | INTRAVENOUS | Status: AC
  Administered 2020-02-09: 500 [IU] via INTRAVENOUS

## 2020-02-17 ENCOUNTER — Other Ambulatory Visit: Payer: Self-pay

## 2020-02-17 ENCOUNTER — Encounter (HOSPITAL_COMMUNITY)
Admission: RE | Admit: 2020-02-17 | Discharge: 2020-02-17 | Disposition: A | Discharge status: Home / Self Care | Payer: PPO | Source: Ambulatory Visit | Attending: Internal Medicine | Admitting: Internal Medicine

## 2020-02-17 DIAGNOSIS — K7581 Nonalcoholic steatohepatitis (NASH): Secondary | ICD-10-CM | POA: Insufficient documentation

## 2020-02-17 LAB — HEMOGLOBIN AND HEMATOCRIT, BLOOD
HCT: 29 % — ABNORMAL LOW (ref 36.0–46.0)
Hemoglobin: 8.2 g/dL — ABNORMAL LOW (ref 12.0–15.0)

## 2020-02-17 LAB — SAMPLE TO BLOOD BANK

## 2020-02-17 MED ORDER — HEPARIN SOD (PORK) LOCK FLUSH 100 UNIT/ML IV SOLN
500.0000 [IU] | Freq: Once | INTRAVENOUS | Status: AC
  Administered 2020-02-17: 500 [IU] via INTRAVENOUS

## 2020-02-19 ENCOUNTER — Encounter (INDEPENDENT_AMBULATORY_CARE_PROVIDER_SITE_OTHER): Payer: PPO | Admitting: Ophthalmology

## 2020-02-19 ENCOUNTER — Ambulatory Visit: Admit: 2020-02-19 | Discharge: 2020-02-19 | Payer: PRIVATE HEALTH INSURANCE

## 2020-02-19 ENCOUNTER — Ambulatory Visit: Admit: 2020-02-19 | Discharge: 2020-02-20 | Payer: PRIVATE HEALTH INSURANCE

## 2020-02-19 ENCOUNTER — Ambulatory Visit
Admit: 2020-02-19 | Discharge: 2020-02-20 | Payer: PRIVATE HEALTH INSURANCE | Attending: Student in an Organized Health Care Education/Training Program | Primary: Student in an Organized Health Care Education/Training Program

## 2020-02-19 DIAGNOSIS — D509 Iron deficiency anemia, unspecified: Principal | ICD-10-CM

## 2020-02-19 DIAGNOSIS — K7290 Hepatic failure, unspecified without coma: Principal | ICD-10-CM

## 2020-02-19 DIAGNOSIS — Z01818 Encounter for other preprocedural examination: Principal | ICD-10-CM

## 2020-02-19 DIAGNOSIS — E119 Type 2 diabetes mellitus without complications: Secondary | ICD-10-CM

## 2020-02-19 DIAGNOSIS — Z794 Long term (current) use of insulin: Principal | ICD-10-CM

## 2020-02-19 DIAGNOSIS — E039 Hypothyroidism, unspecified: Principal | ICD-10-CM

## 2020-02-19 DIAGNOSIS — K31819 Angiodysplasia of stomach and duodenum without bleeding: Principal | ICD-10-CM

## 2020-02-19 DIAGNOSIS — K760 Fatty (change of) liver, not elsewhere classified: Principal | ICD-10-CM

## 2020-02-19 DIAGNOSIS — F411 Generalized anxiety disorder: Principal | ICD-10-CM

## 2020-02-19 DIAGNOSIS — K7469 Other cirrhosis of liver: Principal | ICD-10-CM

## 2020-02-19 DIAGNOSIS — Z9049 Acquired absence of other specified parts of digestive tract: Secondary | ICD-10-CM | POA: Diagnosis not present

## 2020-02-19 DIAGNOSIS — I081 Rheumatic disorders of both mitral and tricuspid valves: Secondary | ICD-10-CM | POA: Diagnosis not present

## 2020-02-19 DIAGNOSIS — K7460 Unspecified cirrhosis of liver: Secondary | ICD-10-CM | POA: Diagnosis not present

## 2020-02-19 DIAGNOSIS — B1920 Unspecified viral hepatitis C without hepatic coma: Secondary | ICD-10-CM | POA: Diagnosis not present

## 2020-02-19 DIAGNOSIS — I517 Cardiomegaly: Secondary | ICD-10-CM | POA: Diagnosis not present

## 2020-02-19 DIAGNOSIS — E7800 Pure hypercholesterolemia, unspecified: Secondary | ICD-10-CM | POA: Diagnosis not present

## 2020-02-19 DIAGNOSIS — Z01810 Encounter for preprocedural cardiovascular examination: Secondary | ICD-10-CM | POA: Diagnosis not present

## 2020-02-19 DIAGNOSIS — I340 Nonrheumatic mitral (valve) insufficiency: Secondary | ICD-10-CM | POA: Diagnosis not present

## 2020-02-19 DIAGNOSIS — F329 Major depressive disorder, single episode, unspecified: Secondary | ICD-10-CM | POA: Diagnosis not present

## 2020-02-19 DIAGNOSIS — F419 Anxiety disorder, unspecified: Secondary | ICD-10-CM | POA: Diagnosis not present

## 2020-02-19 DIAGNOSIS — I10 Essential (primary) hypertension: Secondary | ICD-10-CM | POA: Diagnosis not present

## 2020-02-19 DIAGNOSIS — M810 Age-related osteoporosis without current pathological fracture: Secondary | ICD-10-CM | POA: Diagnosis not present

## 2020-02-19 DIAGNOSIS — Z885 Allergy status to narcotic agent status: Secondary | ICD-10-CM | POA: Diagnosis not present

## 2020-02-19 DIAGNOSIS — I272 Pulmonary hypertension, unspecified: Secondary | ICD-10-CM | POA: Diagnosis not present

## 2020-02-19 DIAGNOSIS — M797 Fibromyalgia: Secondary | ICD-10-CM | POA: Diagnosis not present

## 2020-02-19 LAB — BLOOD GAS, ARTERIAL
BASE EXCESS ARTERIAL: -2.6 — ABNORMAL LOW (ref -2.0–2.0)
O2 SATURATION ARTERIAL: 98.7 % (ref 94.0–100.0)
PH ARTERIAL: 7.45 (ref 7.35–7.45)
PO2 ARTERIAL: 102 mmHg (ref 80.0–110.0)

## 2020-02-19 LAB — CBC W/ AUTO DIFF
BASOPHILS ABSOLUTE COUNT: 0 10*9/L (ref 0.0–0.1)
BASOPHILS RELATIVE PERCENT: 1 %
EOSINOPHILS ABSOLUTE COUNT: 0.1 10*9/L (ref 0.0–0.4)
EOSINOPHILS RELATIVE PERCENT: 1.3 %
HEMATOCRIT: 31.8 % — ABNORMAL LOW (ref 36.0–46.0)
HEMOGLOBIN: 9.5 g/dL — ABNORMAL LOW (ref 12.0–16.0)
LARGE UNSTAINED CELLS: 4 % (ref 0–4)
LYMPHOCYTES ABSOLUTE COUNT: 0.6 10*9/L — ABNORMAL LOW (ref 1.5–5.0)
LYMPHOCYTES RELATIVE PERCENT: 15 %
MEAN CORPUSCULAR HEMOGLOBIN CONC: 29.7 g/dL — ABNORMAL LOW (ref 31.0–37.0)
MEAN CORPUSCULAR HEMOGLOBIN: 24.6 pg — ABNORMAL LOW (ref 26.0–34.0)
MEAN CORPUSCULAR VOLUME: 82.8 fL (ref 80.0–100.0)
MONOCYTES ABSOLUTE COUNT: 0.4 10*9/L (ref 0.2–0.8)
MONOCYTES RELATIVE PERCENT: 9.5 %
NEUTROPHILS ABSOLUTE COUNT: 2.8 10*9/L (ref 2.0–7.5)
NEUTROPHILS RELATIVE PERCENT: 69.6 %
RED BLOOD CELL COUNT: 3.85 10*12/L — ABNORMAL LOW (ref 4.00–5.20)
RED CELL DISTRIBUTION WIDTH: 20.4 % — ABNORMAL HIGH (ref 12.0–15.0)
WBC ADJUSTED: 4 10*9/L — ABNORMAL LOW (ref 4.5–11.0)

## 2020-02-19 LAB — PHOSPHORUS: Phosphate:MCnc:Pt:Ser/Plas:Qn:: 4.3

## 2020-02-19 LAB — COMPREHENSIVE METABOLIC PANEL
ALBUMIN: 3 g/dL — ABNORMAL LOW (ref 3.5–5.0)
ALKALINE PHOSPHATASE: 107 U/L (ref 38–126)
AST (SGOT): 45 U/L — ABNORMAL HIGH (ref 14–38)
BILIRUBIN TOTAL: 2.8 mg/dL — ABNORMAL HIGH (ref 0.0–1.2)
BLOOD UREA NITROGEN: 29 mg/dL — ABNORMAL HIGH (ref 7–21)
BUN / CREAT RATIO: 24
CALCIUM: 8.8 mg/dL (ref 8.5–10.2)
CHLORIDE: 106 mmol/L (ref 98–107)
CO2: 23 mmol/L (ref 22.0–30.0)
CREATININE: 1.22 mg/dL — ABNORMAL HIGH (ref 0.60–1.00)
EGFR CKD-EPI AA FEMALE: 54 mL/min/{1.73_m2} — ABNORMAL LOW (ref >=60)
EGFR CKD-EPI NON-AA FEMALE: 47 mL/min/{1.73_m2} — ABNORMAL LOW (ref >=60)
GLUCOSE RANDOM: 131 mg/dL (ref 70–179)
POTASSIUM: 4.3 mmol/L (ref 3.5–5.0)
PROTEIN TOTAL: 6 g/dL — ABNORMAL LOW (ref 6.5–8.3)
SODIUM: 135 mmol/L (ref 135–145)

## 2020-02-19 LAB — ACETONE BLOOD: Lab: <10

## 2020-02-19 LAB — HEPARIN CORRELATION: Lab: 0.2

## 2020-02-19 LAB — AFP-TUMOR MARKER: Alpha-1-Fetoprotein.tumor marker:MCnc:Pt:Ser/Plas:Qn:: 4

## 2020-02-19 LAB — MONOCYTES RELATIVE PERCENT: Monocytes/100 leukocytes:NFr:Pt:Bld:Qn:Automated count: 9.5

## 2020-02-19 LAB — BASE EXCESS ARTERIAL: Base excess:SCnc:Pt:BldA:Qn:Calculated: -2.6 — ABNORMAL LOW

## 2020-02-19 LAB — PROTEIN TOTAL: Protein:MCnc:Pt:Ser/Plas:Qn:: 6 — ABNORMAL LOW

## 2020-02-19 LAB — CERULOPLASMIN: Ceruloplasmin:MCnc:Pt:Ser/Plas:Qn:: 17.8

## 2020-02-19 LAB — HLA ANTIBODY SCR

## 2020-02-19 LAB — METHYL ALCOHOL: Lab: <10

## 2020-02-19 LAB — TOXICOLOGY SCREEN, URINE
BENZODIAZEPINE SCREEN, URINE: <200
CANNABINOID SCREEN URINE: <20
COCAINE(METAB.)SCREEN, URINE: <150
OPIATE SCREEN URINE: <300

## 2020-02-19 LAB — INR: Coagulation tissue factor induced.INR:RelTime:Pt:PPP:Qn:Coag: 1.48

## 2020-02-19 LAB — GAMMA GLUTAMYL TRANSFERASE: Gamma glutamyl transferase:CCnc:Pt:Ser/Plas:Qn:: 49 — ABNORMAL HIGH

## 2020-02-19 LAB — PROTIME-INR: PROTIME: 17.3 s — ABNORMAL HIGH (ref 10.5–13.5)

## 2020-02-19 LAB — MAGNESIUM: Magnesium:MCnc:Pt:Ser/Plas:Qn:: 1.7

## 2020-02-19 LAB — BILIRUBIN DIRECT: Bilirubin.glucuronidated+Bilirubin.albumin bound:MCnc:Pt:Ser/Plas:Qn:: 0.3

## 2020-02-19 LAB — POLYCHROMASIA

## 2020-02-19 LAB — IRON & TIBC: IRON: 48 ug/dL (ref 35–165)

## 2020-02-19 LAB — ETHANOL: Ethanol:MCnc:Pt:Ser/Plas:Qn:GC: <10

## 2020-02-19 LAB — ALPHA-1 ANTITRYPSIN: Alpha 1 antitrypsin:MCnc:Pt:Ser/Plas:Qn:: 147

## 2020-02-19 LAB — FERRITIN: Ferritin:MCnc:Pt:Ser/Plas:Qn:: 8.8

## 2020-02-19 LAB — IRON SATURATION (CALC): Iron saturation:MFr:Pt:Ser/Plas:Qn:: 12 — ABNORMAL LOW

## 2020-02-19 LAB — ETHYLENE GLYCOL: Ethylene glycol:MCnc:Pt:Ser/Plas/Bld:Qn:: <5

## 2020-02-19 LAB — AMPHETAMINE SCREEN URINE: Lab: <500

## 2020-02-19 MED ADMIN — DOBUTamine 1,000 mg in dextrose 5% 250 ml (4,000 mcg/ml) infusion PMB: INTRAVENOUS | @ 16:00:00 | Stop: 2020-02-19

## 2020-02-19 MED ADMIN — atropine 0.1 mg/mL injection: INTRAVENOUS | @ 16:00:00 | Stop: 2020-02-19

## 2020-02-19 MED ADMIN — metoprolol (LOPRESSOR) injection: INTRAVENOUS | @ 16:00:00 | Stop: 2020-02-19

## 2020-02-19 NOTE — Unmapped (Signed)
Transplant Surgery History and Physical      Assessment/Recommendations:    Marilyn French is a 64 y.o. female seen in consultation at the request of Sherryll Burger Vinie Sill, MD for evaluation of candidacy for transplantation.    I spent 25 minutes with the patient obtaining the above history and physical examination, and greater than 50% of the time was spent counseling and on the substance of the discussion.    Today we discussed liver transplantation going over the surgery to be performed, the hospital course including length of stay, anti-rejection medications and their side effects, results and the cadaveric donor system.    I discussed in detail with Marilyn French the risks and benefits of liver transplantation, including but not limited to: the general anesthetic, monitoring lines, the incision, the hepatectomy, as well as reimplantation of the liver graft and immunosuppressant medications. In regards to the surgical procedure, we noted that it is a major operation performed under general anesthesia with the risks of heart attack, stroke and death. Multiple invasive means of monitoring may be necessary during the operation including an arterial line, a central venous catheter, a foley catheter inserted into the bladder, and a tube from your nose into your stomach to prevent stomach distension. After surgery, the patient will go to the Intensive Care Unit and is then sent to the regular floor when medically stable. I discussed the possible complications including the need for reoperation for bleeding, infection or other complications, the possibility of clotting/leakage of blood vessels, requiring either radiological intervention, surgical intervention, or even retransplantation. I reviewed the possibility of complications involving the biliary tract including leaks, strictures and need for retransplantation for biliary complications. I discussed the possibility of primary nonfunction of the liver graft requiring urgent retransplantation or the result of death. The patient understands the need for long-term immunosuppression therapy as well as monitoring of labs and immunosuppression. Anti-rejection medications, including Prograf or Cyclosporine (Neoral), Cellcept, steroids and others, will be needed after transplantation and for the patient???s entire lifetime. Problems include infection, cancer, hirsutism, tremors, gum swelling, hypertension, bone fractures, aggravation of diabetes or new onset diabetes, cataracts, and rashes. Finally, the donor system was reviewed. All donors are tested for infections and other diseases, but there is a small chance of transmission of diseases including viruses as well as the possible transmission of tumors. Some patients may elect to receive a liver from a donor who was exposed to the Hepatitis B or Hepatitis C virus and the recipient may require certain anti-viral medications to prevent this virus from damaging the new liver.    Finally, I reviewed with the patient how the surgery is expected to improve their health and quality of life, that the average length of hospitalization stay is 10-12 days, and that the length of their expected recovery period, including when normal daily activities may be resumed, will be patient dependent.    Marilyn French had all their questions answered and wishes to proceed with the liver transplant evaluation process.    This patient was seen and evaluated with Dr. Matilde Haymaker.  This patient was last seen in our clinic on 11/03/2019 and has history of liver failure in setting of NAFLD and currently has a MELD score of 19.  Patient underwent a CT abdomen on 11/07/2019 which revealed a 20 cm arterial hyperenhancement in segment 4A and an unchanged 1.5 cm nonenhancing linear segment 6 hypodensity with LI-RADS 3 interpretation. There still appears to be no contraindications to liver transplantation from a surgical  perspective.    -Plan to follow-up with repeat imaging (MRI) 3 months.  -Follow-up results of 03/17/2020 echocardiogram.      HPI  Marilyn French is a 64 year old female with a past medical history significant for depression, anxiety, fibromyalgia, hypothyroidism, hypertension, hypercholesterolemia, diabetes, cirrhosis in the setting of nonalcoholic fatty liver disease c/b gastric antral vascular ectasia requiring blood transfusions and hepatic encephalopathy s/p TIPS (07/08/18) who presents to clinic today for liver transplant evaluation.  She has a history of liver cirrhosis in the setting of nonalcoholic fatty liver disease which has been complicated by gastric antral vascular ectasia requiring frequent blood transfusions and now s/p variceal banding (01/02/20).  She also has a history of hepatic encephalopathy (uses lactulose) and was last hospitalized for this on 10/05/2019.  She reports that since her hospitalization she is improved.  She reports doing outpatient PT and being able to walk around her house without the use of her cane.  She does state that while she has improved she is not able to walk more than a mile without becoming short of breath or having to pause.  She again states that then she has done past.   She also has a history of diabetes and reports good glycemic control (last hemoglobin A1c 4.9, 09/30/2019).  She continues to work with nutrition, last saw in consultation on 02/03/2020.  She denies any recent fevers, chills, night sweats, dizziness, headaches, coughing, sneezing, shortness of breath, chest pain, abdominal pain, urinary or bowel dysfunction or lower extremity pain/urination/edema.  She has an abdominal surgical history significant for appendectomy open cholecystectomy.  Patient lives with her husband in [place], [place].    MELD-Na score: 19 at 02/19/2020  8:33 AM  MELD score: 17 at 02/19/2020  8:33 AM  Calculated from:  Serum Creatinine: 1.22 mg/dL at 0/05/8118  1:47 AM  Serum Sodium: 135 mmol/L at 02/19/2020  8:33 AM  Total Bilirubin: 2.8 mg/dL at 04/19/9561  1:30 AM  INR(ratio): 1.48 at 02/19/2020  8:33 AM  Age: 64 years        Allergies    Ferumoxytol, Definity [perflutren lipid microspheres], and Tramadol hcl      Medications      Current Outpatient Medications   Medication Sig Dispense Refill   ??? bumetanide (BUMEX) 2 MG tablet Take 1 tablet (2 mg total) by mouth daily. 30 tablet 1   ??? busPIRone (BUSPAR) 10 MG tablet Take 1 tablet (10 mg total) by mouth two (2) times a day. 120 tablet 0   ??? cholecalciferol, vitamin D3, (CHOLECALCIFEROL) 1,000 unit tablet Take 1,000 Units by mouth every other day.      ??? insulin ASPART (NOVOLOG FLEXPEN U-100 INSULIN) 100 unit/mL injection pen Use with given sliding scale    CBG 70-120: 0 units  CBG 121-150: 1 unit  CBG 151-200: 2 units  CBG 201-250: 3 units  CBG 251-300: 5 units  CBG 301-350: 7 units  CBG greater than 351: 9 units (Patient not taking: Reported on 01/02/2020)     ??? insulin glargine (LANTUS SOLOSTAR U-100 INSULIN) 100 unit/mL (3 mL) injection pen Inject 0.45 mL (45 Units total) under the skin daily. 1350 Units 0   ??? insulin lispro (HUMALOG) 100 unit/mL injection INJECT 15 TO 35 UNITS SUBCUTANEUOSLY PER SLIDING SCALE 3 TIMES DAILY     ??? lactulose (CHRONULAC) 10 gram/15 mL solution Take 20 g by mouth Four (4) times a day.      ??? levothyroxine (SYNTHROID, LEVOTHROID) 25 MCG tablet Take  25 mcg by mouth daily.     ??? lidocaine-prilocaine (EMLA) 2.5-2.5 % cream      ??? MAGNESIUM ORAL Take by mouth.     ??? multivit with iron,minerals (FLINTSTONES COMPLETE, IRON, ORAL) Take by mouth.      ??? OXYGEN-AIR DELIVERY SYSTEMS MISC Inhale 2 L nightly.     ??? pediatric multivit-iron-min (FLINTSTONES COMPLETE) tablet Chew 2 tablets daily.      ??? pen needle, diabetic 31 gauge x 1/4 Ndle      ??? potassium chloride SA (K-DUR,KLOR-CON) 20 MEQ tablet Take 20 mEq by mouth daily.      ??? rifAXIMin (XIFAXAN) 550 mg Tab Take 1 tablet (550 mg total) by mouth Two (2) times a day. 60 tablet 11   ??? rosuvastatin (CRESTOR) 5 MG tablet Take 5 mg by mouth every other day.     ??? sertraline (ZOLOFT) 50 MG tablet Take 1 tablet (50 mg total) by mouth daily. 1 tablet (50mg ) by mouth daily. 60 tablet 0   ??? spironolactone (ALDACTONE) 100 MG tablet Take 1 tablet (100 mg total) by mouth daily. 30 tablet 11   ??? ZINC ACETATE ORAL Take by mouth.       No current facility-administered medications for this visit.         Past Medical History    Past Medical History:   Diagnosis Date   ??? Cirrhosis (CMS-HCC)    ??? Depression    ??? Diabetes mellitus (CMS-HCC)    ??? Fibromyalgia    ??? GAVE (gastric antral vascular ectasia)    ??? History of transfusion 04/2019    reports frequent blood tranfusions   ??? HTN (hypertension)    ??? Hypercholesterolemia    ??? Hypothyroid    ??? NAFLD (nonalcoholic fatty liver disease)    ??? Osteoporosis          Past Surgical History    Past Surgical History:   Procedure Laterality Date   ??? APPENDECTOMY     ??? CHOLECYSTECTOMY     ??? IR TIPS  07/08/2018    IR TIPS 07/08/2018 Soledad Gerlach, MD IMG VIR [facility]   ??? PR COLSC FLEXIBLE W/CONTROL BLEEDING ANY METHOD N/A 10/17/2019    Procedure: COLONOSCOPY, FLEXIBLE, PROXIMAL TO SPLENIC FLEXURE; DX, W/CONTROL OF BLEEDING;  Surgeon: Jules Husbands, MD;  [facility]: [facility];  Service: Gastroenterology   ??? PR RIGHT HEART CATH O2 SATURATION & CARDIAC OUTPUT Right 01/18/2018    Procedure: Right Heart Catheterization;  Surgeon: Marlaine Hind, MD;  Location: [facility] CATH;  Service: Cardiology   ??? PR UPPER GI ENDOSCOPY,CTRL BLEED N/A 03/05/2019    Procedure: UGI ENDOSCOPY; WITH CONTROL OF BLEEDING, ANY METHOD;  Surgeon: Andrey Farmer, MD;  Location: [facility];  Service: Gastroenterology   ??? PR UPPER GI ENDOSCOPY,CTRL BLEED  04/04/2019    Procedure: UGI ENDOSCOPY; WITH CONTROL OF BLEEDING, ANY METHOD;  Surgeon: Janyth Pupa, MD;  Location: [facility];  Service: Gastroenterology   ??? PR UPPER GI ENDOSCOPY,CTRL BLEED N/A 05/23/2019 Procedure: UGI ENDOSCOPY; WITH CONTROL OF BLEEDING, ANY METHOD;  Surgeon: Janyth Pupa, MD;  Location: [facility];  Service: Gastroenterology   ??? PR UPPER GI ENDOSCOPY,CTRL BLEED N/A 06/13/2019    Procedure: UGI ENDOSCOPY; WITH CONTROL OF BLEEDING, ANY METHOD;  Surgeon: Annie Paras, MD;  Location: [facility];  Service: Gastroenterology   ??? PR UPPER GI ENDOSCOPY,CTRL BLEED N/A 07/11/2019  Procedure: UGI ENDOSCOPY; WITH CONTROL OF BLEEDING, ANY METHOD;  Surgeon: Annie Paras, MD;  Location: [facility];  Service: Gastroenterology   ??? PR UPPER GI ENDOSCOPY,CTRL BLEED N/A 09/16/2019    Procedure: UGI ENDOSCOPY; WITH CONTROL OF BLEEDING, ANY METHOD;  Surgeon: Beverly Milch, MD;  Location: [facility];  Service: Gastrointestinal   ??? PR UPPER GI ENDOSCOPY,CTRL BLEED N/A 10/07/2019    Procedure: UGI ENDOSCOPY; WITH CONTROL OF BLEEDING, ANY METHOD;  Surgeon: Pia Mau, MD;  Location: [facility];  Service: Gastroenterology   ??? PR UPPER GI ENDOSCOPY,CTRL BLEED N/A 01/02/2020    Procedure: UGI ENDOSCOPY; WITH CONTROL OF BLEEDING, ANY METHOD;  Surgeon: Janyth Pupa, MD;  Location: [facility];  Service: Gastroenterology   ??? PR UPPER GI ENDOSCOPY,DIAGNOSIS N/A 03/03/2019    Procedure: UGI ENDO, INCLUDE ESOPHAGUS, STOMACH, & DUODENUM &/OR JEJUNUM; DX W/WO COLLECTION SPECIMN, BY BRUSH OR WASH;  Surgeon: Luanne Bras, MD;  Location: [facility];  Service: Gastroenterology   ??? PR UPPER GI ENDOSCOPY,LIGAT VARIX N/A 01/02/2020    Procedure: UGI ENDO; Everlene Balls LIG ESOPH &/OR GASTRIC VARICES;  Surgeon: Janyth Pupa, MD;  Location: [facility];  Service: Gastroenterology   ??? SALPINGOOPHORECTOMY           Family History    The patient's family history is not on file..      Social History:  Social History     Socioeconomic History   ??? Marital status: Married     Spouse name: None   ??? Number of children: None   ??? Years of education: None   ??? Highest education level: None   Occupational History   ??? None   Tobacco Use   ??? Smoking status: Never Smoker   ??? Smokeless tobacco: Never Used   Vaping Use   ??? Vaping Use: Never used   Substance and Sexual Activity   ??? Alcohol use: No   ??? Drug use: No   ??? Sexual activity: None   Other Topics Concern   ??? None   Social History Narrative   ??? None     Social Determinants of Health     Financial Resource Strain:    ??? Difficulty of Paying Living Expenses:    Food Insecurity: Unknown   ??? Worried About Programme researcher, broadcasting/film/video in the Last Year: Patient refused   ??? Ran Out of Food in the Last Year: Patient refused   Transportation Needs:    ??? Lack of Transportation (Medical):    ??? Lack of Transportation (Non-Medical):    Physical Activity:    ??? Days of Exercise per Week:    ??? Minutes of Exercise per Session:    Stress:    ??? Feeling of Stress :    Social Connections:    ??? Frequency of Communication with Friends and Family:    ??? Frequency of Social Gatherings with Friends and Family:    ??? Attends Religious Services:    ??? Database administrator or Organizations:    ??? Attends Engineer, structural:    ??? Marital Status:          Review of Systems    A 12 system review of systems was negative except as noted in HPI    Objective     PE: Blood pressure 118/36, pulse 79, temperature 36.9 ??C, temperature source Tympanic, height 160 cm (5' 3), weight 81.2  kg (179 lb). Body mass index is 31.71 kg/m??.  General: BMI 32, pale, appears chronically ill though no acute distress  Lungs: Work of breathing, symmetrical chest wall rise  Heart: Regular rate  Abd: Protuberant, soft and compressible; no rebound or guarding; well-healed right subcostal incision (s/p open cholecystectomy)  Ascites: mild   Skin: no rashes, jaundice or skin lesions noted; otherwise warm and dry  Ext: Lower extremities appear well-perfused; 1+  Neuro: non-focal exam. appears with somewhat blunted affect but appropriately answers questions        Test Results    Labs:  All lab results last 24 hours:    Recent Results (from the past 24 hour(s))   Type and Screen    Collection Time: 02/19/20  8:33 AM   Result Value Ref Range    ABO Grouping O NEG     Antibody Screen NEG    Comprehensive Metabolic Panel    Collection Time: 02/19/20  8:33 AM   Result Value Ref Range    Sodium 135 135 - 145 mmol/L    Potassium 4.3 3.5 - 5.0 mmol/L    Chloride 106 98 - 107 mmol/L    Anion Gap 6 (L) 7 - 15 mmol/L    CO2 23.0 22.0 - 30.0 mmol/L    BUN 29 (H) 7 - 21 mg/dL    Creatinine 6.64 (H) 0.60 - 1.00 mg/dL    BUN/Creatinine Ratio 24     EGFR CKD-EPI Non-African American, Female 47 (L) >=60 mL/min/1.108m2    EGFR CKD-EPI African American, Female 54 (L) >=60 mL/min/1.43m2    Glucose 131 70 - 179 mg/dL    Calcium 8.8 8.5 - 40.3 mg/dL    Albumin 3.0 (L) 3.5 - 5.0 g/dL    Total Protein 6.0 (L) 6.5 - 8.3 g/dL    Total Bilirubin 2.8 (H) 0.0 - 1.2 mg/dL    AST 45 (H) 14 - 38 U/L    ALT 27 <35 U/L    Alkaline Phosphatase 107 38 - 126 U/L   Phosphorus Level    Collection Time: 02/19/20  8:33 AM   Result Value Ref Range    Phosphorus 4.3 2.9 - 4.7 mg/dL   Bilirubin, Direct    Collection Time: 02/19/20  8:33 AM   Result Value Ref Range    Bilirubin, Direct 0.30 0.00 - 0.40 mg/dL   Iron Panel    Collection Time: 02/19/20  8:33 AM   Result Value Ref Range    Iron 48 35 - 165 ug/dL    TIBC 474.2 595.6 - 387.5 mg/dL    Transferrin 643.3 295.1 - 380.0 mg/dL    Iron Saturation (%) 12 (L) 15 - 50 %   Alpha-1-antitrypsin    Collection Time: 02/19/20  8:33 AM   Result Value Ref Range    A-1 Antitrypsin 147 88 - 183 mg/dL   Ceruloplasmin    Collection Time: 02/19/20  8:33 AM   Result Value Ref Range    Ceruloplasmin 17.8 15.0 - 52.0 mg/dL   Ferritin    Collection Time: 02/19/20  8:33 AM   Result Value Ref Range    Ferritin 8.8 3.0 - 151.0 ng/mL   GGT (Gamma GT)    Collection Time: 02/19/20  8:33 AM   Result Value Ref Range    GGT 49 (H) 11 - 48 U/L   Magnesium Level    Collection Time: 02/19/20  8:33 AM   Result Value Ref Range    Magnesium 1.7 1.6 -  2.2 mg/dL   Protime-INR    Collection Time: 02/19/20  8:33 AM   Result Value Ref Range    PT 17.3 (H) 10.5 - 13.5 sec    INR 1.48    PTT    Collection Time: 02/19/20  8:33 AM   Result Value Ref Range    APTT 29.5 25.3 - 37.1 sec    Heparin Correlation 0.2    CBC w/ Differential    Collection Time: 02/19/20  8:33 AM   Result Value Ref Range    WBC 4.0 (L) 4.5 - 11.0 10*9/L    RBC 3.85 (L) 4.00 - 5.20 10*12/L    HGB 9.5 (L) 12.0 - 16.0 g/dL    HCT 29.5 (L) 62.1 - 46.0 %    MCV 82.8 80.0 - 100.0 fL    MCH 24.6 (L) 26.0 - 34.0 pg    MCHC 29.7 (L) 31.0 - 37.0 g/dL    RDW 30.8 (H) 65.7 - 15.0 %    MPV      Platelet      Variable HGB Concentration Slight (A) Not Present    Microcytosis Moderate (A) Not Present    Anisocytosis Moderate (A) Not Present    Hypochromasia Marked (A) Not Present   Ethanol, GC-[place]    Collection Time: 02/19/20  8:33 AM   Result Value Ref Range    Alcohol, Ethyl <10.0 Undefined mg/dL   Ethylene glycol    Collection Time: 02/19/20  8:33 AM   Result Value Ref Range    Ethylene Glycol Level <5 Undefined mg/dL   Alcohol, Isopropanol    Collection Time: 02/19/20  8:33 AM   Result Value Ref Range    Alcohol, Isopropyl <10 Undefined mg/dL    Acetone <84 Undefined mg/dL   Alcohol, Methyl    Collection Time: 02/19/20  8:33 AM   Result Value Ref Range    Methyl Alcohol <10 Undefined mg/dL   EKG 12 lead    Collection Time: 02/19/20  9:03 AM   Result Value Ref Range    EKG Systolic BP  mmHg    EKG Diastolic BP  mmHg    EKG Ventricular Rate 79 BPM    EKG Atrial Rate 79 BPM    EKG P-R Interval 226 ms    EKG QRS Duration 102 ms    EKG Q-T Interval 400 ms    EKG QTC Calculation 458 ms    EKG Calculated P Axis 67 degrees    EKG Calculated R Axis -59 degrees    EKG Calculated T Axis 36 degrees    QTC Fredericia 438 ms   Toxicology Screen, Urine    Collection Time: 02/19/20  9:15 AM   Result Value Ref Range    Amphetamine Screen, Ur <500 ng/mL Not Applicable    Barbiturate Screen, Ur <200 ng/mL Not Applicable    Benzodiazepine Screen, Urine <200 ng/mL Not Applicable    Cannabinoid Scrn, Ur <20 ng/mL Not Applicable    Methadone Screen, Urine <300 ng/mL Not Applicable    Cocaine(Metab.)Screen, Urine <150 ng/mL Not Applicable    Opiate Scrn, Ur <300 ng/mL Not Applicable   POCT Glucose    Collection Time: 02/19/20 11:17 AM   Result Value Ref Range    Glucose, POC 137 70 - 179 mg/dL       Imaging:   EKG 12 lead    Result Date: 02/19/2020  SINUS RHYTHM WITH 1ST DEGREE AV BLOCK LEFT ANTERIOR FASCICULAR BLOCK MINIMAL VOLTAGE CRITERIA FOR  LVH, MAY BE NORMAL VARIANT ( [employer] product ) ABNORMAL ECG WHEN COMPARED WITH ECG OF 20-Oct-2019 03:44, T WAVE AMPLITUDE HAS INCREASED IN ANTERIOR LEADS    XR Chest 2 views    Result Date: 02/19/2020  EXAM: XR CHEST 2 VIEWS DATE: 02/19/2020 9:07 AM ACCESSION: [id number] UN DICTATED: 02/19/2020 1:36 PM INTERPRETATION LOCATION: [facility]     CLINICAL INDICATION: 64 years old Female with [place] ; LIVER TRANSPLANT  - K72.90 - End stage liver disease (CMS - HCC) - K53.976 - Encounter for pre - transplant evaluation for chronic liver disease      COMPARISON: Chest radiograph 10/12/2019     TECHNIQUE: PA and Lateral Chest Radiographs.     FINDINGS:     Right chest wall port catheter terminates in the superior vena cava. TIPS catheter and additional right upper quadrant hyperdense material again noted.     No focal consolidation.     No pleural effusion or pneumothorax.     Unremarkable cardiomediastinal silhouette.             No acute findings.    DSE (Dobutamine Stress Echocardiogram)    Result Date: 02/19/2020  Patient Info Name:     DARLA MCDONALD Age:     72 years DOB:     05/21/1956 Gender:     Female MRN:     [id number] Accession #:     [id number] UN Technical Quality:     Fair Exam Date:     02/19/2020 11:38 AM Exam Location:     [facility] Admit Date:     02/19/2020     Exam Type: ECHOCARDIOGRAM STRESS DOBUTAMINE     Study Info Indications      - liver     Dobutamine stress echocardiogram is performed.     Staff Referring Physician:     Janyth Pupa ; Reading Fellow:     Debby Freiberg MD Ordering Physician:     Janyth Pupa Sonographer:     Bryson Dames     Account #:     [id number]         Summary   1. Stress echocardiogram is normal.         Stress Echo Findings Left Ventricle   Normal left venticular systolic function with no regional wall motion abnormalities noted at rest.   Overall global left ventricular systolic function improved post stress.   No regional wall motion abnormalities noted post stress.         Protocol:     Dobutamine Rest HR:     78 bpm Peak HR:     144 bpm Rest Sys BP:     132 mmHg Rest Dias BP:     37 mmHg Peak Sys BP:     139 mmHg Peak Dias BP:     40 mmHg Max Pred HR:     156 bpm % Max Pred HR:     92 % Target HR:     133 bpm Max RPP:     20,016 bpm*mmHg Target HR Summary:     Patient's target heart rate was achieved Total Time:     13 min : 0 sec Atropine Dose:     0.2 mg Total Dose:     40 g/kg Resting ECG   Normal sinus rhythm.   Left axis deviation.   Minor resting ST/T wave changes. Stress ECG   No abnormal ST/T wave changes with stress.  Report Signatures Echo Resident Pleas Koch  MD on 02/19/2020 01:09 PM

## 2020-02-19 NOTE — Unmapped (Signed)
ABG drawn on room air from right radial without complication.  Sample sent to lab for analysis.

## 2020-02-19 NOTE — Unmapped (Signed)
Addendum  created 02/19/20 1550 by Jaquelyn Bitter, MD    Clinical Note Signed

## 2020-02-20 LAB — HIV ANTIGEN/ANTIBODY COMBO: HIV 1+2 Ab+HIV1 p24 Ag:PrThr:Pt:Ser/Plas:Ord:IA: NONREACTIVE

## 2020-02-20 LAB — VARICELLA ZOSTER IGG: Varicella zoster virus Ab.IgG:PrThr:Pt:Ser:Ord:: POSITIVE

## 2020-02-20 LAB — HEPATITIS B SURFACE ANTIBODY QUANT: Hepatitis B virus surface Ab:ACnc:Pt:Ser:Qn:: 67.64 — ABNORMAL HIGH

## 2020-02-20 LAB — HEPATITIS A IGG
HEPATITIS A IGG: NONREACTIVE
Hepatitis A virus Ab.IgG:PrThr:Pt:Ser:Ord:: NONREACTIVE

## 2020-02-20 LAB — HEPATITIS B SURFACE ANTIGEN
HEPATITIS B SURFACE ANTIGEN: NONREACTIVE
Hepatitis B virus surface Ag:PrThr:Pt:Ser:Ord:: NONREACTIVE

## 2020-02-20 LAB — HEMOGLOBIN A1C
ESTIMATED AVERAGE GLUCOSE: 108 mg/dL
Hemoglobin A1c/Hemoglobin.total:MFr:Pt:Bld:Qn:: 5.4

## 2020-02-20 LAB — RUBELLA IGG SCREEN: Rubella virus Ab.IgG:PrThr:Pt:Ser:Ord:: POSITIVE

## 2020-02-20 LAB — TOXOPLASMA GONDII IGG: Toxoplasma gondii Ab.IgG:PrThr:Pt:Ser:Ord:: NEGATIVE

## 2020-02-20 LAB — SYPHILIS RPR SCREEN: Reagin Ab:PrThr:Pt:Ser:Ord:RPR: NONREACTIVE

## 2020-02-20 LAB — HEPATITIS C ANTIBODY: Hepatitis C virus Ab:PrThr:Pt:Ser:Ord:: NONREACTIVE

## 2020-02-20 LAB — HEPATITIS B CORE TOTAL ANTIBODY: Hepatitis B virus core Ab:PrThr:Pt:Ser/Plas:Ord:IA: NONREACTIVE

## 2020-02-20 LAB — CMV IGG: Cytomegalovirus Ab.IgG:Imp:Pt:Ser/Plas:Nom:: NEGATIVE

## 2020-02-20 LAB — HERPES SIMPLEX VIRUS 1 IGG: Lab: POSITIVE — AB

## 2020-02-21 LAB — ETHYL GLUCURONIDE SCREEN, URINE: Ethyl glucuronide:PrThr:Pt:Urine:Ord:Screen: NEGATIVE

## 2020-02-21 LAB — ANTI-MITOCHONDRIAL LEVEL: Lab: 4.2

## 2020-02-23 ENCOUNTER — Encounter (HOSPITAL_COMMUNITY)
Admission: RE | Admit: 2020-02-23 | Discharge: 2020-02-23 | Disposition: A | Discharge status: Home / Self Care | Payer: PPO | Source: Ambulatory Visit | Attending: Internal Medicine | Admitting: Internal Medicine

## 2020-02-23 ENCOUNTER — Other Ambulatory Visit: Payer: Self-pay

## 2020-02-23 ENCOUNTER — Encounter (INDEPENDENT_AMBULATORY_CARE_PROVIDER_SITE_OTHER): Payer: PPO | Admitting: Ophthalmology

## 2020-02-23 DIAGNOSIS — E113311 Type 2 diabetes mellitus with moderate nonproliferative diabetic retinopathy with macular edema, right eye: Secondary | ICD-10-CM | POA: Diagnosis not present

## 2020-02-23 DIAGNOSIS — H43813 Vitreous degeneration, bilateral: Secondary | ICD-10-CM | POA: Diagnosis not present

## 2020-02-23 DIAGNOSIS — K7581 Nonalcoholic steatohepatitis (NASH): Secondary | ICD-10-CM | POA: Diagnosis not present

## 2020-02-23 DIAGNOSIS — E11311 Type 2 diabetes mellitus with unspecified diabetic retinopathy with macular edema: Secondary | ICD-10-CM | POA: Diagnosis not present

## 2020-02-23 DIAGNOSIS — H35033 Hypertensive retinopathy, bilateral: Secondary | ICD-10-CM

## 2020-02-23 DIAGNOSIS — E113291 Type 2 diabetes mellitus with mild nonproliferative diabetic retinopathy without macular edema, right eye: Secondary | ICD-10-CM

## 2020-02-23 DIAGNOSIS — I10 Essential (primary) hypertension: Secondary | ICD-10-CM | POA: Diagnosis not present

## 2020-02-23 LAB — ANTINUCLEAR ANTIBODIES (ANA): Lab: POSITIVE — AB

## 2020-02-23 LAB — ANA: ANA TITER 1: 1:80 {titer}

## 2020-02-23 LAB — SAMPLE TO BLOOD BANK

## 2020-02-23 LAB — HEMOGLOBIN AND HEMATOCRIT, BLOOD
HCT: 27.4 % — ABNORMAL LOW (ref 36.0–46.0)
Hemoglobin: 7.9 g/dL — ABNORMAL LOW (ref 12.0–15.0)

## 2020-02-23 MED ORDER — HEPARIN SOD (PORK) LOCK FLUSH 100 UNIT/ML IV SOLN
500.0000 [IU] | Freq: Once | INTRAVENOUS | Status: AC
  Administered 2020-02-23: 500 [IU] via INTRAVENOUS

## 2020-02-24 LAB — EPSTEIN-BARR VCA IGG ANTIBODY: Epstein Barr virus capsid Ab.IgG:PrThr:Pt:Ser:Ord:: POSITIVE — AB

## 2020-02-24 LAB — OPIATES INTERPRETATION

## 2020-02-24 LAB — TB AG2 VALUE: Lab: 0.03

## 2020-02-24 LAB — TB NIL VALUE: Lab: 0.01

## 2020-02-24 LAB — QUANTIFERON TB GOLD PLUS
QUANTIFERON ANTIGEN 1 MINUS NIL: 0.01 [IU]/mL
QUANTIFERON TB GOLD PLUS: NEGATIVE
QUANTIFERON TB NIL VALUE: 0.01 [IU]/mL

## 2020-02-24 LAB — OPIATE, URINE, QUANTITATIVE
CODEINE-BY LC-MS/MS: NEGATIVE ng/mL
HYDROCODONE-BY LC-MS/MS: NEGATIVE ng/mL
HYDROMORPHONE-BY LC-MS/MS: NEGATIVE ng/mL
NALOXONE-BY LC-MS/MS: NEGATIVE ng/mL
OXYCODONE-BY LC-MS/MS: NEGATIVE ng/mL

## 2020-02-24 LAB — TB MITOGEN VALUE: Lab: 0.66

## 2020-02-24 LAB — TB AG1 VALUE: Lab: 0.02

## 2020-02-24 LAB — QUANTIFERON ANTIGEN 1 MINUS NIL: Lab: 0.01

## 2020-03-01 ENCOUNTER — Other Ambulatory Visit (INDEPENDENT_AMBULATORY_CARE_PROVIDER_SITE_OTHER): Payer: Self-pay | Admitting: Internal Medicine

## 2020-03-01 ENCOUNTER — Other Ambulatory Visit: Payer: Self-pay

## 2020-03-01 ENCOUNTER — Encounter (HOSPITAL_COMMUNITY)
Admission: RE | Admit: 2020-03-01 | Discharge: 2020-03-01 | Disposition: A | Discharge status: Home / Self Care | Payer: PPO | Source: Ambulatory Visit | Attending: Internal Medicine | Admitting: Internal Medicine

## 2020-03-01 DIAGNOSIS — K7581 Nonalcoholic steatohepatitis (NASH): Secondary | ICD-10-CM | POA: Diagnosis not present

## 2020-03-01 DIAGNOSIS — K7460 Unspecified cirrhosis of liver: Secondary | ICD-10-CM

## 2020-03-01 LAB — PREPARE RBC (CROSSMATCH)

## 2020-03-01 LAB — SAMPLE TO BLOOD BANK

## 2020-03-01 LAB — HEMOGLOBIN AND HEMATOCRIT, BLOOD
HCT: 23.8 % — ABNORMAL LOW (ref 36.0–46.0)
Hemoglobin: 6.6 g/dL — CL (ref 12.0–15.0)

## 2020-03-01 MED ORDER — HEPARIN SOD (PORK) LOCK FLUSH 100 UNIT/ML IV SOLN
500.0000 [IU] | Freq: Once | INTRAVENOUS | Status: AC
  Administered 2020-03-01: 500 [IU] via INTRAVENOUS
  Filled 2020-03-01: qty 5

## 2020-03-02 ENCOUNTER — Encounter (HOSPITAL_COMMUNITY)
Admission: RE | Admit: 2020-03-02 | Discharge: 2020-03-02 | Disposition: A | Discharge status: Home / Self Care | Payer: PPO | Source: Ambulatory Visit | Attending: Internal Medicine | Admitting: Internal Medicine

## 2020-03-02 DIAGNOSIS — R600 Localized edema: Secondary | ICD-10-CM

## 2020-03-02 DIAGNOSIS — K7581 Nonalcoholic steatohepatitis (NASH): Secondary | ICD-10-CM | POA: Diagnosis not present

## 2020-03-02 LAB — BASIC METABOLIC PANEL
Anion gap: 9 (ref 5–15)
BUN: 28 mg/dL — ABNORMAL HIGH (ref 8–23)
CO2: 20 mmol/L — ABNORMAL LOW (ref 22–32)
Calcium: 8 mg/dL — ABNORMAL LOW (ref 8.9–10.3)
Chloride: 104 mmol/L (ref 98–111)
Creatinine, Ser: 1.3 mg/dL — ABNORMAL HIGH (ref 0.44–1.00)
GFR calc Af Amer: 50 mL/min — ABNORMAL LOW (ref >60)
GFR calc non Af Amer: 43 mL/min — ABNORMAL LOW (ref >60)
Glucose, Bld: 247 mg/dL — ABNORMAL HIGH (ref 70–99)
Potassium: 4.6 mmol/L (ref 3.5–5.1)
Sodium: 133 mmol/L — ABNORMAL LOW (ref 135–145)

## 2020-03-02 MED ORDER — HEPARIN SOD (PORK) LOCK FLUSH 100 UNIT/ML IV SOLN
INTRAVENOUS | Status: AC
  Filled 2020-03-02: qty 5

## 2020-03-02 MED ORDER — ACETAMINOPHEN 325 MG PO TABS
650.0000 mg | ORAL_TABLET | Freq: Once | ORAL | Status: AC
  Administered 2020-03-02: 650 mg via ORAL

## 2020-03-02 MED ORDER — DIPHENHYDRAMINE HCL 25 MG PO CAPS
ORAL_CAPSULE | ORAL | Status: AC
  Filled 2020-03-02: qty 1

## 2020-03-02 MED ORDER — HEPARIN SOD (PORK) LOCK FLUSH 100 UNIT/ML IV SOLN
500.0000 [IU] | Freq: Once | INTRAVENOUS | Status: AC
  Administered 2020-03-02: 500 [IU] via INTRAVENOUS

## 2020-03-02 MED ORDER — ACETAMINOPHEN 325 MG PO TABS
ORAL_TABLET | ORAL | Status: AC
  Filled 2020-03-02: qty 2

## 2020-03-02 MED ORDER — SODIUM CHLORIDE 0.9% IV SOLUTION: Freq: Once | INTRAVENOUS | Status: AC

## 2020-03-02 MED ORDER — DIPHENHYDRAMINE HCL 25 MG PO CAPS
25.0000 mg | ORAL_CAPSULE | Freq: Once | ORAL | Status: AC
  Administered 2020-03-02: 25 mg via ORAL

## 2020-03-03 ENCOUNTER — Encounter (INDEPENDENT_AMBULATORY_CARE_PROVIDER_SITE_OTHER): Payer: Self-pay | Admitting: *Deleted

## 2020-03-03 LAB — BPAM RBC
Blood Product Expiration Date: 202107072359
Blood Product Expiration Date: 202107082359
ISSUE DATE / TIME: 202106150907
ISSUE DATE / TIME: 202106151042
Unit Type and Rh: 9500
Unit Type and Rh: 9500

## 2020-03-03 LAB — TYPE AND SCREEN
ABO/RH(D): O NEG
Antibody Screen: NEGATIVE
Unit division: 0
Unit division: 0

## 2020-03-04 MED ORDER — BUSPIRONE 10 MG TABLET
ORAL_TABLET | Freq: Two times a day (BID) | ORAL | 0 refills | 60 days | Status: CP
Start: 2020-03-04 — End: ?

## 2020-03-04 MED ORDER — SERTRALINE 50 MG TABLET
ORAL_TABLET | Freq: Every day | ORAL | 0 refills | 60 days | Status: CP
Start: 2020-03-04 — End: ?

## 2020-03-04 NOTE — Unmapped (Signed)
Patient paged to request refills for medication.  Refill sent to local pharmacy.

## 2020-03-05 LAB — FSAB CLASS 2 ANTIBODY SPECIFICITY
CLASS 2 ANTIBODIES IDENTIFIED: 1:05 {titer}
HLA CL2 AB RESULT: POSITIVE

## 2020-03-05 LAB — FSAB CLASS 1 ANTIBODY SPECIFICITY

## 2020-03-05 LAB — HLA CL2 AB COMMENT: Lab: 0

## 2020-03-05 LAB — HLA CLASS 1 ANTIBODY RESULT: Lab: POSITIVE

## 2020-03-08 NOTE — Unmapped (Signed)
Return call placed and spoke with [name] - patient states reason for call has been resolved.

## 2020-03-09 ENCOUNTER — Encounter (HOSPITAL_COMMUNITY)
Admission: RE | Admit: 2020-03-09 | Discharge: 2020-03-09 | Disposition: A | Discharge status: Home / Self Care | Payer: PPO | Source: Ambulatory Visit | Attending: Internal Medicine | Admitting: Internal Medicine

## 2020-03-09 ENCOUNTER — Other Ambulatory Visit: Payer: Self-pay

## 2020-03-09 DIAGNOSIS — K7581 Nonalcoholic steatohepatitis (NASH): Secondary | ICD-10-CM | POA: Diagnosis not present

## 2020-03-09 LAB — SAMPLE TO BLOOD BANK

## 2020-03-09 LAB — HEMOGLOBIN AND HEMATOCRIT, BLOOD
HCT: 28.9 % — ABNORMAL LOW (ref 36.0–46.0)
Hemoglobin: 8.5 g/dL — ABNORMAL LOW (ref 12.0–15.0)

## 2020-03-09 MED ORDER — HEPARIN SOD (PORK) LOCK FLUSH 100 UNIT/ML IV SOLN
500.0000 [IU] | INTRAVENOUS | Status: AC | PRN
  Administered 2020-03-09: 500 [IU]

## 2020-03-09 MED ORDER — HEPARIN SOD (PORK) LOCK FLUSH 100 UNIT/ML IV SOLN
INTRAVENOUS | Status: AC
  Filled 2020-03-09: qty 5

## 2020-03-09 NOTE — Progress Notes (Addendum)
1127-Dr Rehman notified of Hgb 8.5. No transfusion this week.

## 2020-03-09 NOTE — Progress Notes (Signed)
Pt notified of Hgb 8.5. Voiced understanding

## 2020-03-09 NOTE — Progress Notes (Signed)
Here for blood draw for Hgb/Hct and clot hold for possible transfusion. Porta cath flushed. Tolerated well.

## 2020-03-15 ENCOUNTER — Encounter (HOSPITAL_COMMUNITY)
Admission: RE | Admit: 2020-03-15 | Discharge: 2020-03-15 | Disposition: A | Discharge status: Home / Self Care | Payer: PPO | Source: Ambulatory Visit | Attending: Internal Medicine | Admitting: Internal Medicine

## 2020-03-15 ENCOUNTER — Encounter (HOSPITAL_COMMUNITY): Payer: Self-pay

## 2020-03-15 ENCOUNTER — Other Ambulatory Visit: Payer: Self-pay

## 2020-03-15 DIAGNOSIS — K7581 Nonalcoholic steatohepatitis (NASH): Secondary | ICD-10-CM | POA: Diagnosis not present

## 2020-03-15 LAB — PREPARE RBC (CROSSMATCH)

## 2020-03-15 LAB — SAMPLE TO BLOOD BANK

## 2020-03-15 LAB — BASIC METABOLIC PANEL
Anion gap: 10 (ref 5–15)
BUN: 30 mg/dL — ABNORMAL HIGH (ref 8–23)
CO2: 20 mmol/L — ABNORMAL LOW (ref 22–32)
Calcium: 8.2 mg/dL — ABNORMAL LOW (ref 8.9–10.3)
Chloride: 105 mmol/L (ref 98–111)
Creatinine, Ser: 1.14 mg/dL — ABNORMAL HIGH (ref 0.44–1.00)
GFR calc Af Amer: 59 mL/min — ABNORMAL LOW (ref >60)
GFR calc non Af Amer: 51 mL/min — ABNORMAL LOW (ref >60)
Glucose, Bld: 170 mg/dL — ABNORMAL HIGH (ref 70–99)
Potassium: 4.2 mmol/L (ref 3.5–5.1)
Sodium: 135 mmol/L (ref 135–145)

## 2020-03-15 LAB — HEMOGLOBIN AND HEMATOCRIT, BLOOD
HCT: 24.4 % — ABNORMAL LOW (ref 36.0–46.0)
Hemoglobin: 7 g/dL — ABNORMAL LOW (ref 12.0–15.0)

## 2020-03-15 MED ORDER — HEPARIN SOD (PORK) LOCK FLUSH 100 UNIT/ML IV SOLN
500.0000 [IU] | Freq: Once | INTRAVENOUS | Status: AC
  Administered 2020-03-15: 500 [IU] via INTRAVENOUS

## 2020-03-17 ENCOUNTER — Encounter (HOSPITAL_COMMUNITY)
Admission: RE | Admit: 2020-03-17 | Discharge: 2020-03-17 | Disposition: A | Discharge status: Home / Self Care | Payer: PPO | Source: Ambulatory Visit | Attending: Internal Medicine | Admitting: Internal Medicine

## 2020-03-17 ENCOUNTER — Encounter (HOSPITAL_COMMUNITY): Payer: Self-pay

## 2020-03-17 ENCOUNTER — Other Ambulatory Visit: Payer: Self-pay

## 2020-03-17 DIAGNOSIS — E119 Type 2 diabetes mellitus without complications: Secondary | ICD-10-CM | POA: Diagnosis not present

## 2020-03-17 DIAGNOSIS — I10 Essential (primary) hypertension: Secondary | ICD-10-CM | POA: Diagnosis not present

## 2020-03-17 DIAGNOSIS — K7581 Nonalcoholic steatohepatitis (NASH): Secondary | ICD-10-CM | POA: Diagnosis not present

## 2020-03-17 MED ORDER — FUROSEMIDE 10 MG/ML IJ SOLN
20.0000 mg | Freq: Once | INTRAMUSCULAR | Status: AC
  Administered 2020-03-17: 20 mg via INTRAVENOUS

## 2020-03-17 MED ORDER — DIPHENHYDRAMINE HCL 25 MG PO CAPS
25.0000 mg | ORAL_CAPSULE | Freq: Once | ORAL | Status: AC
  Administered 2020-03-17: 25 mg via ORAL

## 2020-03-17 MED ORDER — ACETAMINOPHEN 325 MG PO TABS
650.0000 mg | ORAL_TABLET | Freq: Once | ORAL | Status: AC
  Administered 2020-03-17: 650 mg via ORAL

## 2020-03-17 MED ORDER — HEPARIN SOD (PORK) LOCK FLUSH 100 UNIT/ML IV SOLN
500.0000 [IU] | Freq: Once | INTRAVENOUS | Status: AC
  Administered 2020-03-17: 500 [IU] via INTRAVENOUS

## 2020-03-17 MED ORDER — SODIUM CHLORIDE 0.9% IV SOLUTION: Freq: Once | INTRAVENOUS | Status: AC

## 2020-03-17 NOTE — Unmapped (Signed)
Patient's husband calls to report that she has a severe GI bleed and is receiving 2 units of blood at [facility].  He is extremely upset that she has not been approved to receive a liver transplant, or to go active on our list.      Attempted to explain liver allocation based on MELD score but patient's husband was not satisfied.  He states that she has been sick for 12 years, and they have done everything that [facility] has asked, and they're still not on the list.      After chart review attempted to help him understand why she had not been approved yet, but he still remained frustrated.  Offered emotional support and explanation, asked for action items but patient's husband did not name any specific action that could be completed by coordinator today.  Also offered sympathy that her MELD score did not reflect her illness fairly.

## 2020-03-18 LAB — BPAM RBC
Blood Product Expiration Date: 202107262359
Blood Product Expiration Date: 202107262359
ISSUE DATE / TIME: 202106300921
ISSUE DATE / TIME: 202106301125
Unit Type and Rh: 9500
Unit Type and Rh: 9500

## 2020-03-18 LAB — TYPE AND SCREEN
ABO/RH(D): O NEG
Antibody Screen: NEGATIVE
Unit division: 0
Unit division: 0

## 2020-03-19 ENCOUNTER — Encounter (INDEPENDENT_AMBULATORY_CARE_PROVIDER_SITE_OTHER): Payer: Self-pay | Admitting: *Deleted

## 2020-03-23 ENCOUNTER — Other Ambulatory Visit: Payer: Self-pay

## 2020-03-23 ENCOUNTER — Encounter (HOSPITAL_COMMUNITY)
Admission: RE | Admit: 2020-03-23 | Discharge: 2020-03-23 | Disposition: A | Discharge status: Home / Self Care | Payer: PPO | Source: Ambulatory Visit | Attending: Internal Medicine | Admitting: Internal Medicine

## 2020-03-23 DIAGNOSIS — K7581 Nonalcoholic steatohepatitis (NASH): Secondary | ICD-10-CM | POA: Diagnosis not present

## 2020-03-23 LAB — HEMOGLOBIN AND HEMATOCRIT, BLOOD
HCT: 25.9 % — ABNORMAL LOW (ref 36.0–46.0)
Hemoglobin: 7.8 g/dL — ABNORMAL LOW (ref 12.0–15.0)

## 2020-03-23 LAB — SAMPLE TO BLOOD BANK

## 2020-03-23 MED ORDER — HEPARIN SOD (PORK) LOCK FLUSH 100 UNIT/ML IV SOLN
500.0000 [IU] | Freq: Once | INTRAVENOUS | Status: AC
  Administered 2020-03-23: 500 [IU] via INTRAVENOUS

## 2020-03-26 ENCOUNTER — Encounter (INDEPENDENT_AMBULATORY_CARE_PROVIDER_SITE_OTHER): Payer: Self-pay | Admitting: *Deleted

## 2020-03-29 ENCOUNTER — Encounter (INDEPENDENT_AMBULATORY_CARE_PROVIDER_SITE_OTHER): Payer: Self-pay

## 2020-03-30 ENCOUNTER — Encounter (HOSPITAL_COMMUNITY)
Admission: RE | Admit: 2020-03-30 | Discharge: 2020-03-30 | Disposition: A | Discharge status: Home / Self Care | Payer: PPO | Source: Ambulatory Visit | Attending: Internal Medicine | Admitting: Internal Medicine

## 2020-03-30 ENCOUNTER — Observation Stay (HOSPITAL_COMMUNITY)
Admission: EM | Admit: 2020-03-30 | Discharge: 2020-03-31 | Disposition: A | Discharge status: Home / Self Care | Payer: PPO | Attending: Family Medicine | Admitting: Family Medicine

## 2020-03-30 ENCOUNTER — Encounter (HOSPITAL_COMMUNITY): Payer: Self-pay | Admitting: Emergency Medicine

## 2020-03-30 ENCOUNTER — Other Ambulatory Visit: Payer: Self-pay

## 2020-03-30 DIAGNOSIS — I509 Heart failure, unspecified: Secondary | ICD-10-CM | POA: Diagnosis not present

## 2020-03-30 DIAGNOSIS — K7682 Hepatic encephalopathy: Secondary | ICD-10-CM | POA: Diagnosis present

## 2020-03-30 DIAGNOSIS — Z20822 Contact with and (suspected) exposure to covid-19: Secondary | ICD-10-CM | POA: Insufficient documentation

## 2020-03-30 DIAGNOSIS — E119 Type 2 diabetes mellitus without complications: Secondary | ICD-10-CM | POA: Diagnosis not present

## 2020-03-30 DIAGNOSIS — Z79899 Other long term (current) drug therapy: Secondary | ICD-10-CM | POA: Diagnosis not present

## 2020-03-30 DIAGNOSIS — D649 Anemia, unspecified: Secondary | ICD-10-CM | POA: Diagnosis not present

## 2020-03-30 DIAGNOSIS — I2510 Atherosclerotic heart disease of native coronary artery without angina pectoris: Secondary | ICD-10-CM | POA: Insufficient documentation

## 2020-03-30 DIAGNOSIS — D696 Thrombocytopenia, unspecified: Secondary | ICD-10-CM | POA: Diagnosis not present

## 2020-03-30 DIAGNOSIS — R799 Abnormal finding of blood chemistry, unspecified: Secondary | ICD-10-CM | POA: Diagnosis present

## 2020-03-30 DIAGNOSIS — E039 Hypothyroidism, unspecified: Secondary | ICD-10-CM | POA: Diagnosis not present

## 2020-03-30 DIAGNOSIS — I5032 Chronic diastolic (congestive) heart failure: Secondary | ICD-10-CM | POA: Diagnosis present

## 2020-03-30 DIAGNOSIS — Z794 Long term (current) use of insulin: Secondary | ICD-10-CM | POA: Diagnosis not present

## 2020-03-30 DIAGNOSIS — K31819 Angiodysplasia of stomach and duodenum without bleeding: Secondary | ICD-10-CM | POA: Diagnosis present

## 2020-03-30 DIAGNOSIS — I8500 Esophageal varices without bleeding: Secondary | ICD-10-CM | POA: Diagnosis present

## 2020-03-30 DIAGNOSIS — K7460 Unspecified cirrhosis of liver: Secondary | ICD-10-CM | POA: Diagnosis present

## 2020-03-30 DIAGNOSIS — Z8719 Personal history of other diseases of the digestive system: Secondary | ICD-10-CM

## 2020-03-30 DIAGNOSIS — R531 Weakness: Secondary | ICD-10-CM | POA: Diagnosis not present

## 2020-03-30 DIAGNOSIS — I110 Hypertensive heart disease with heart failure: Secondary | ICD-10-CM | POA: Diagnosis not present

## 2020-03-30 DIAGNOSIS — K7290 Hepatic failure, unspecified without coma: Secondary | ICD-10-CM | POA: Diagnosis present

## 2020-03-30 DIAGNOSIS — K7581 Nonalcoholic steatohepatitis (NASH): Secondary | ICD-10-CM | POA: Diagnosis not present

## 2020-03-30 LAB — SARS CORONAVIRUS 2 BY RT PCR (HOSPITAL ORDER, PERFORMED IN ~~LOC~~ HOSPITAL LAB): SARS Coronavirus 2: NEGATIVE

## 2020-03-30 LAB — HEMOGLOBIN AND HEMATOCRIT, BLOOD
HCT: 18.6 % — ABNORMAL LOW (ref 36.0–46.0)
Hemoglobin: 5.2 g/dL — CL (ref 12.0–15.0)

## 2020-03-30 LAB — COMPREHENSIVE METABOLIC PANEL
ALT: 28 U/L (ref 0–44)
AST: 34 U/L (ref 15–41)
Albumin: 2.1 g/dL — ABNORMAL LOW (ref 3.5–5.0)
Alkaline Phosphatase: 74 U/L (ref 38–126)
Anion gap: 5 (ref 5–15)
BUN: 25 mg/dL — ABNORMAL HIGH (ref 8–23)
CO2: 24 mmol/L (ref 22–32)
Calcium: 7.6 mg/dL — ABNORMAL LOW (ref 8.9–10.3)
Chloride: 104 mmol/L (ref 98–111)
Creatinine, Ser: 1.03 mg/dL — ABNORMAL HIGH (ref 0.44–1.00)
GFR calc Af Amer: >60 mL/min (ref >60)
GFR calc non Af Amer: 57 mL/min — ABNORMAL LOW (ref >60)
Glucose, Bld: 117 mg/dL — ABNORMAL HIGH (ref 70–99)
Potassium: 3.7 mmol/L (ref 3.5–5.1)
Sodium: 133 mmol/L — ABNORMAL LOW (ref 135–145)
Total Bilirubin: 2.2 mg/dL — ABNORMAL HIGH (ref 0.3–1.2)
Total Protein: 4.3 g/dL — ABNORMAL LOW (ref 6.5–8.1)

## 2020-03-30 LAB — CBC WITH DIFFERENTIAL/PLATELET
Abs Immature Granulocytes: 0 10*3/uL (ref 0.00–0.07)
Basophils Absolute: 0 10*3/uL (ref 0.0–0.1)
Basophils Relative: 0 %
Eosinophils Absolute: 0.1 10*3/uL (ref 0.0–0.5)
Eosinophils Relative: 3 %
HCT: 19.5 % — ABNORMAL LOW (ref 36.0–46.0)
Hemoglobin: 5.5 g/dL — CL (ref 12.0–15.0)
Immature Granulocytes: 0 %
Lymphocytes Relative: 18 %
Lymphs Abs: 0.6 10*3/uL — ABNORMAL LOW (ref 0.7–4.0)
MCH: 26.2 pg (ref 26.0–34.0)
MCHC: 28.2 g/dL — ABNORMAL LOW (ref 30.0–36.0)
MCV: 92.9 fL (ref 80.0–100.0)
Monocytes Absolute: 0.4 10*3/uL (ref 0.1–1.0)
Monocytes Relative: 14 %
Neutro Abs: 2 10*3/uL (ref 1.7–7.7)
Neutrophils Relative %: 65 %
Platelets: 49 10*3/uL — ABNORMAL LOW (ref 150–400)
RBC: 2.1 MIL/uL — ABNORMAL LOW (ref 3.87–5.11)
RDW: 19.9 % — ABNORMAL HIGH (ref 11.5–15.5)
WBC: 3 10*3/uL — ABNORMAL LOW (ref 4.0–10.5)
nRBC: 0 % (ref 0.0–0.2)

## 2020-03-30 LAB — SAMPLE TO BLOOD BANK

## 2020-03-30 LAB — CBG MONITORING, ED: Glucose-Capillary: 69 mg/dL — ABNORMAL LOW (ref 70–99)

## 2020-03-30 LAB — PREPARE RBC (CROSSMATCH)

## 2020-03-30 LAB — AMMONIA: Ammonia: 65 umol/L — ABNORMAL HIGH (ref 9–35)

## 2020-03-30 MED ORDER — ACETAMINOPHEN 325 MG PO TABS: 650.0000 mg | ORAL_TABLET | Freq: Four times a day (QID) | ORAL | Status: DC | PRN

## 2020-03-30 MED ORDER — LEVOTHYROXINE SODIUM 25 MCG PO TABS
25.0000 ug | ORAL_TABLET | Freq: Every day | ORAL | Status: DC
  Administered 2020-03-31: 25 ug via ORAL
  Filled 2020-03-30: qty 1

## 2020-03-30 MED ORDER — BUSPIRONE HCL 5 MG PO TABS
10.0000 mg | ORAL_TABLET | Freq: Two times a day (BID) | ORAL | Status: DC
  Administered 2020-03-30 – 2020-03-31 (×2): 10 mg via ORAL
  Filled 2020-03-30 (×2): qty 2

## 2020-03-30 MED ORDER — SERTRALINE HCL 50 MG PO TABS
50.0000 mg | ORAL_TABLET | Freq: Every day | ORAL | Status: DC
  Administered 2020-03-31: 50 mg via ORAL
  Filled 2020-03-30: qty 1

## 2020-03-30 MED ORDER — LACTULOSE 10 GM/15ML PO SOLN
40.0000 g | Freq: Three times a day (TID) | ORAL | Status: DC
  Administered 2020-03-30 – 2020-03-31 (×3): 40 g via ORAL
  Filled 2020-03-30 (×3): qty 60

## 2020-03-30 MED ORDER — MAGNESIUM OXIDE 400 MG PO TABS
400.0000 mg | ORAL_TABLET | Freq: Every day | ORAL | Status: DC
  Administered 2020-03-30: 400 mg via ORAL
  Filled 2020-03-30 (×4): qty 1

## 2020-03-30 MED ORDER — FUROSEMIDE 10 MG/ML IJ SOLN
40.0000 mg | Freq: Once | INTRAMUSCULAR | Status: AC
  Administered 2020-03-30: 40 mg via INTRAVENOUS
  Filled 2020-03-30: qty 4

## 2020-03-30 MED ORDER — ACETAMINOPHEN 650 MG RE SUPP: 650.0000 mg | Freq: Four times a day (QID) | RECTAL | Status: DC | PRN

## 2020-03-30 MED ORDER — PANTOPRAZOLE SODIUM 40 MG IV SOLR
40.0000 mg | INTRAVENOUS | Status: DC
  Administered 2020-03-30: 40 mg via INTRAVENOUS
  Filled 2020-03-30: qty 40

## 2020-03-30 MED ORDER — INSULIN ASPART 100 UNIT/ML ~~LOC~~ SOLN
0.0000 [IU] | Freq: Four times a day (QID) | SUBCUTANEOUS | Status: DC
  Administered 2020-03-31: 8 [IU] via SUBCUTANEOUS
  Administered 2020-03-31: 3 [IU] via SUBCUTANEOUS
  Administered 2020-03-31: 2 [IU] via SUBCUTANEOUS

## 2020-03-30 MED ORDER — ROSUVASTATIN CALCIUM 10 MG PO TABS
10.0000 mg | ORAL_TABLET | Freq: Every day | ORAL | Status: DC
  Administered 2020-03-31: 10 mg via ORAL
  Filled 2020-03-30: qty 1

## 2020-03-30 MED ORDER — HEPARIN SOD (PORK) LOCK FLUSH 100 UNIT/ML IV SOLN
500.0000 [IU] | Freq: Once | INTRAVENOUS | Status: AC
  Administered 2020-03-30: 500 [IU] via INTRAVENOUS

## 2020-03-30 MED ORDER — ONDANSETRON HCL 4 MG/2ML IJ SOLN: 4.0000 mg | Freq: Four times a day (QID) | INTRAMUSCULAR | Status: DC | PRN

## 2020-03-30 MED ORDER — RIFAXIMIN 550 MG PO TABS
550.0000 mg | ORAL_TABLET | Freq: Two times a day (BID) | ORAL | Status: DC
  Administered 2020-03-30 – 2020-03-31 (×2): 550 mg via ORAL
  Filled 2020-03-30 (×2): qty 1

## 2020-03-30 MED ORDER — SODIUM CHLORIDE 0.9% IV SOLUTION: Freq: Once | INTRAVENOUS | Status: AC

## 2020-03-30 MED ORDER — ONDANSETRON HCL 4 MG PO TABS: 4.0000 mg | ORAL_TABLET | Freq: Four times a day (QID) | ORAL | Status: DC | PRN

## 2020-03-30 MED ORDER — POTASSIUM CHLORIDE CRYS ER 20 MEQ PO TBCR
20.0000 meq | EXTENDED_RELEASE_TABLET | Freq: Two times a day (BID) | ORAL | Status: DC
  Administered 2020-03-31: 20 meq via ORAL
  Filled 2020-03-30 (×2): qty 1

## 2020-03-30 MED ORDER — BUMETANIDE 1 MG PO TABS
2.0000 mg | ORAL_TABLET | Freq: Two times a day (BID) | ORAL | Status: DC
  Administered 2020-03-31 (×2): 2 mg via ORAL
  Filled 2020-03-30 (×2): qty 2

## 2020-03-30 MED ORDER — SPIRONOLACTONE 25 MG PO TABS
100.0000 mg | ORAL_TABLET | Freq: Every day | ORAL | Status: DC
  Administered 2020-03-31: 100 mg via ORAL
  Filled 2020-03-30: qty 4

## 2020-03-30 MED ORDER — POTASSIUM CHLORIDE CRYS ER 20 MEQ PO TBCR
40.0000 meq | EXTENDED_RELEASE_TABLET | Freq: Once | ORAL | Status: AC
  Administered 2020-03-30: 40 meq via ORAL
  Filled 2020-03-30: qty 2

## 2020-03-30 NOTE — ED Triage Notes (Signed)
Patient had labs at Dr. Laural Golden, HGB 5.2, was told to come to ED.

## 2020-03-30 NOTE — H&P (Signed)
History and Physical    Maria [name] TXM:[id number] DOB: September 06, 1956 DOA: 03/30/2020  PCP: Glenda Chroman, MD   Patient coming from: Home  I have personally briefly reviewed patient's old medical records in [place]  Chief Complaint: Abnormal Lab  HPI: Maria [name] is a 64 y.o. female with medical history significant for nonalcoholic liver cirrhosis with thrombocytopenia, hepatic encephalopathy and esophageal varices, diastolic CHF, diabetes mellitus, GAVE, pulmonary hypertension. Patient was sent to the ER today after outpatient blood work at Dr. Laural Golden, showed a hemoglobin of 5.5.  Patient gets routine blood transfusions, last transfusion was just about 2 weeks ago,03/18/2020.  Patient reports black stools just over a week ago, she is unable to tell me how many days or how many times as she normally has loose stools from lactulose.  She denies vomiting of blood, no blood in stools.  She does not take NSAIDs. She reports mild difficulty breathing with activity over the past few days, which normally happens when her blood counts are low.  No chest pain.  ED Course: Blood pressure 125/31, heart rate 80s.  Hemoglobin 5.5, WBC 3, platelets 49.  2 units PRBC ordered for transfusion.  Hospitalist to admit for acute on chronic anemia.  Review of Systems: As per HPI all other systems reviewed and negative.  Past Medical History:  Diagnosis Date  . Anemia   . CHF (congestive heart failure) ([place])   . Cirrhosis ([place])   . Depression   . Diabetes mellitus   . Esophageal bleed, non-variceal   . Eye hemorrhage    Behind left eye  . Fibromyalgia   . Hypercholesteremia   . Hypertension   . Hypothyroidism   . NAFLD (nonalcoholic fatty liver disease)   . Osteoarthrosis   . Osteoporosis   . PONV (postoperative nausea and vomiting)   . UTI (urinary tract infection) 11/12 end of the month   Patient feels that she passed a kidney stone at that time    Past Surgical History:    Procedure Laterality Date  . APPENDECTOMY  1980  . BILATERAL SALPINGOOPHORECTOMY    . CHOLECYSTECTOMY    . COLONOSCOPY  03/15/2011  . COLONOSCOPY N/A 03/24/2016   Procedure: COLONOSCOPY;  Surgeon: [name], MD;  [facility];  Service: Endoscopy;  Laterality: N/A;  855  . ESOPHAGEAL BANDING  04/24/2012   Procedure: ESOPHAGEAL BANDING;  Surgeon: [name], MD;  [facility];  Service: Endoscopy;  Laterality: N/A;  . Esophageal BANDING  08/03/2012   [facility] in [place] , [place]  09/24/2012   Procedure: ESOPHAGEAL BANDING;  Surgeon: [name], MD;  [facility]: [facility];  Service: Endoscopy;  Laterality: N/A;  Banding x 3  . ESOPHAGEAL BANDING N/A 01/29/2013   Procedure: ESOPHAGEAL BANDING;  Surgeon: [name], MD;  [facility];  Service: Endoscopy;  Laterality: N/A;  . ESOPHAGEAL BANDING N/A 06/19/2014   Procedure: ESOPHAGEAL BANDING;  Surgeon: [name], MD;  [facility];  Service: Endoscopy;  Laterality: N/A;  . ESOPHAGEAL BANDING N/A 01/05/2016   Procedure: ESOPHAGEAL BANDING;  Surgeon: [name], MD;  [facility];  Service: Endoscopy;  Laterality: N/A;  . ESOPHAGEAL BANDING N/A 08/03/2016   Procedure: ESOPHAGEAL BANDING;  Surgeon: [name], MD;  [facility];  Service: Endoscopy;  Laterality: N/A;  . ESOPHAGEAL BANDING N/A 05/02/2017   Procedure: ESOPHAGEAL BANDING;  Surgeon: [name], MD;  [facility];  Service: Endoscopy;  Laterality: N/A;  . ESOPHAGOGASTRODUODENOSCOPY  04/24/2012   Procedure: ESOPHAGOGASTRODUODENOSCOPY (EGD);  Surgeon: [name], MD;  [facility];  Service: Endoscopy;  Laterality: N/A;  300  . ESOPHAGOGASTRODUODENOSCOPY N/A 01/29/2013   Procedure: ESOPHAGOGASTRODUODENOSCOPY (EGD);  Surgeon: [name], MD;  [facility];  Service: Endoscopy;  Laterality: N/A;  1200  .  ESOPHAGOGASTRODUODENOSCOPY N/A 05/22/2013   Procedure: ESOPHAGOGASTRODUODENOSCOPY (EGD);  Surgeon: [name], MD;  [facility];  Service: Endoscopy;  Laterality: N/A;  1:55  . ESOPHAGOGASTRODUODENOSCOPY N/A 12/31/2013   Procedure: ESOPHAGOGASTRODUODENOSCOPY (EGD);  Surgeon: [name], MD;  [facility];  Service: Endoscopy;  Laterality: N/A;  1200  . ESOPHAGOGASTRODUODENOSCOPY N/A 06/19/2014   Procedure: ESOPHAGOGASTRODUODENOSCOPY (EGD);  Surgeon: [name], MD;  [facility];  Service: Endoscopy;  Laterality: N/A;  1055  . ESOPHAGOGASTRODUODENOSCOPY N/A 01/15/2015   Procedure: ESOPHAGOGASTRODUODENOSCOPY (EGD);  Surgeon: [name], MD;  [facility];  Service: Endoscopy;  Laterality: N/A;  730 - moved to 9:45 - moved to 1250-Ann notified pt  . ESOPHAGOGASTRODUODENOSCOPY N/A 06/30/2015   Procedure: ESOPHAGOGASTRODUODENOSCOPY (EGD);  Surgeon: [name], MD;  [facility];  Service: Endoscopy;  Laterality: N/A;  1200  . ESOPHAGOGASTRODUODENOSCOPY N/A 01/05/2016   Procedure: ESOPHAGOGASTRODUODENOSCOPY (EGD);  Surgeon: [name], MD;  [facility];  Service: Endoscopy;  Laterality: N/A;  830  . ESOPHAGOGASTRODUODENOSCOPY N/A 08/03/2016   Procedure: ESOPHAGOGASTRODUODENOSCOPY (EGD);  Surgeon: [name], MD;  [facility];  Service: Endoscopy;  Laterality: N/A;  930  . ESOPHAGOGASTRODUODENOSCOPY N/A 05/02/2017   Procedure: ESOPHAGOGASTRODUODENOSCOPY (EGD);  Surgeon: [name], MD;  [facility];  Service: Endoscopy;  Laterality: N/A;  12:00  . ESOPHAGOGASTRODUODENOSCOPY N/A 08/17/2017   Procedure: ESOPHAGOGASTRODUODENOSCOPY (EGD);  Surgeon: [name], MD;  [facility];  Service: Endoscopy;  Laterality: N/A;  8:15  . ESOPHAGOGASTRODUODENOSCOPY N/A 09/12/2017   Procedure: ESOPHAGOGASTRODUODENOSCOPY (EGD);  Surgeon: [name], MD;  [facility];  Service: Endoscopy;  Laterality: N/A;  1040  . ESOPHAGOGASTRODUODENOSCOPY N/A 10/10/2017   Procedure: ESOPHAGOGASTRODUODENOSCOPY (EGD);  Surgeon: [name], MD;  [facility];  Service: Endoscopy;  Laterality: N/A;  225  . ESOPHAGOGASTRODUODENOSCOPY N/A 11/09/2017   Procedure: ESOPHAGOGASTRODUODENOSCOPY (EGD);  Surgeon: [name], MD;  [facility];  Service: Endoscopy;  Laterality: N/A;  . ESOPHAGOGASTRODUODENOSCOPY (EGD) WITH PROPOFOL  09/24/2012   Procedure: ESOPHAGOGASTRODUODENOSCOPY (EGD) WITH PROPOFOL;  Surgeon: [name], MD;  [facility];  Service: Endoscopy;  Laterality: N/A;  GE junction at 36  . ESOPHAGOGASTRODUODENOSCOPY (EGD) WITH PROPOFOL N/A 07/06/2017   Procedure: ESOPHAGOGASTRODUODENOSCOPY (EGD) WITH PROPOFOL;  Surgeon: [name], MD;  [facility];  Service: Endoscopy;  Laterality: N/A;  . ESOPHAGOGASTRODUODENOSCOPY W/ BANDING  08/2010  . GIVENS CAPSULE STUDY N/A 07/05/2017   Procedure: GIVENS CAPSULE STUDY;  Surgeon: [name], MD;  [facility];  Service: Endoscopy;  Laterality: N/A;  . HOT HEMOSTASIS N/A 08/17/2017   Procedure: HOT HEMOSTASIS (ARGON PLASMA COAGULATION/BICAP);  Surgeon: [name], MD;  [facility];  Service: Endoscopy;  Laterality: N/A;  . HOT HEMOSTASIS  09/12/2017   Procedure: HOT HEMOSTASIS (ARGON PLASMA COAGULATION/BICAP);  Surgeon: [name], MD;  [facility];  Service: Endoscopy;;  gastric  .  HOT HEMOSTASIS  10/10/2017   Procedure: HOT HEMOSTASIS (ARGON PLASMA COAGULATION/BICAP);  Surgeon: [name], MD;  [facility];  Service: Endoscopy;;  . POLYPECTOMY  03/24/2016   Procedure: POLYPECTOMY;  Surgeon: [name], MD;  [facility];  Service: Endoscopy;;  sigmoid polyp  . PORTACATH PLACEMENT Right 06/02/2019   Procedure: INSERTION PORT-A-CATH;  Surgeon: Aviva Signs, MD;  [facility];  Service:  General;  Laterality: Right;  . RIGHT HEART CATH N/A 04/16/2017   Procedure: Right Heart Cath;  Surgeon: [name], MD;  Location: [place] [facility];  Service: Cardiovascular;  Laterality: N/A;  . RIGHT HEART CATH N/A 07/12/2017   Procedure: RIGHT HEART CATH;  Surgeon: [name], MD;  Location: [place] [facility];  Service: Cardiovascular;  Laterality: N/A;  . TONSILLECTOMY    . UPPER GASTROINTESTINAL ENDOSCOPY  03/15/2011   EGD ED BANDING/TCS  . UPPER GASTROINTESTINAL ENDOSCOPY  09/05/2010  . UPPER GASTROINTESTINAL ENDOSCOPY  08/11/2010  . VAGINAL HYSTERECTOMY       reports that she has never smoked. She has never used smokeless tobacco. She reports that she does not drink alcohol and does not use drugs.  Allergies  Allergen Reactions  . Ferumoxytol Other (See Comments)    Patient has severe back and chest pain when receiving FERAHEME infusions.   . Tramadol Hcl Other (See Comments)    WEAKNESS     Family History  Problem Relation Age of Onset  . Lung cancer Mother   . Diabetes Father   . Diabetes Sister   . Hypertension Sister   . Hypothyroidism Sister   . Colon cancer Brother   . Diabetes Sister   . Hypothyroidism Brother   . Healthy Daughter   . Obesity Daughter   . Hypertension Daughter      Prior to Admission medications   Medication Sig Start Date End Date Taking? Authorizing Provider  bumetanide (BUMEX) 2 MG tablet Take 2 mg by mouth daily.  09/10/19   Rehman, Mechele Dawley, MD  busPIRone (BUSPAR) 10 MG tablet Take 10 mg by mouth 2 (two) times daily.    [provider]  Cholecalciferol 25 MCG (1000 UT) tablet Take 1,000 Units by mouth every other day.     [provider]  HUMALOG 100 UNIT/ML injection Inject 15-35 Units into the skin 3 (three) times daily with meals. Sliding scale insulin 100-150= 15 units 150-200= 20 units 250-300= 30 units 300-350= 35 units 07/01/18   [provider]  Insulin Glargine (LANTUS SOLOSTAR)  100 UNIT/ML Solostar Pen Inject 10-55 Units into the skin See admin instructions. Inject 55 units subcutaneously in the morning & 10 units subcutaneously at night, if needed 03/06/19   [provider]  levothyroxine (SYNTHROID, LEVOTHROID) 25 MCG tablet Take 25 mcg by mouth daily before breakfast.     [provider]  lidocaine-prilocaine (EMLA) cream Apply 1 application topically daily as needed. 08/19/19   [provider]  magnesium oxide (MAG-OX) 400 MG tablet Take 400 mg by mouth daily.     [provider]  pantoprazole (PROTONIX) 40 MG tablet Take 1 tablet (40 mg total) by mouth daily before breakfast. 09/09/19   Rehman, Mechele Dawley, MD  Pediatric Multivitamins-Iron (FLINTSTONES PLUS IRON PO) Take 2 tablets by mouth daily.    [provider]  potassium chloride SA (KLOR-CON) 20 MEQ tablet Take 2 tablets (40 mEq total) by mouth daily. Patient taking differently: Take 20 mEq by mouth  2 (two) times daily.  09/02/19   Enzo Bi, MD  rifaximin (XIFAXAN) 550 MG TABS tablet Take 1 tablet (550 mg total) by mouth 2 (two) times daily. 02/27/19   Barton Dubois, MD  rosuvastatin (CRESTOR) 10 MG tablet Take 10 mg by mouth daily. 09/10/19   [provider]  sertraline (ZOLOFT) 50 MG tablet Take 50 mg by mouth daily.  03/10/19   [provider]  spironolactone (ALDACTONE) 100 MG tablet Take 1 tablet (100 mg total) by mouth daily. 10/06/19 12/01/19  Manuella Ghazi, Pratik D, DO  trimethoprim-polymyxin b (POLYTRIM) ophthalmic solution Place 1 drop into the left eye See admin instructions. Use 3 to 4 times daily for 2 days following monthly eye injection 05/06/18   [provider]  zinc sulfate 220 (50 Zn) MG capsule Take 1 capsule (220 mg total) by mouth daily. 08/01/18   Barton Dubois, MD    Physical Exam: Vitals:   03/30/20 1346 03/30/20 1347  BP: (!) 125/31   Pulse: 80   Resp: 20   Temp: 98.3 F (36.8 C)   TempSrc: Oral   SpO2: 100%   Weight:   83.5 kg  Height:  5' 4"  (1.626 m)    Constitutional: Pale.  Calm, comfortable Vitals:   03/30/20 1346 03/30/20 1347  BP: (!) 125/31   Pulse: 80   Resp: 20   Temp: 98.3 F (36.8 C)   TempSrc: Oral   SpO2: 100%   Weight:  83.5 kg  Height:  5' 4"  (1.626 m)   Eyes: PERRL, lids and conjunctivae normal ENMT: Mucous membranes are moist.  Neck: normal, supple, no masses, no thyromegaly Respiratory: clear to auscultation bilaterally, no wheezing, no crackles. Normal respiratory effort. No accessory muscle use.  Cardiovascular: Regular rate and rhythm, 3/6 systolic murmurs - known. No extremity edema. 2+ pedal pulses.   Abdomen: no tenderness, no masses palpated. No hepatosplenomegaly. Bowel sounds positive.  Musculoskeletal: no clubbing / cyanosis. No joint deformity upper and lower extremities. Good ROM, no contractures.  Skin: no rashes, lesions, ulcers. No induration Neurologic: No apparent cranial nerve abnormality, moving extremities spontaneously. Psychiatric: Normal judgment and insight. Alert and oriented x 3. Normal mood.   Labs on Admission: I have personally reviewed following labs and imaging studies  CBC: Recent Labs  Lab 03/30/20 1045 03/30/20 1618  WBC  --  3.0*  NEUTROABS  --  2.0  HGB 5.2* 5.5*  HCT 18.6* 19.5*  MCV  --  92.9  PLT  --  49*   Basic Metabolic Panel: Recent Labs  Lab 03/30/20 1618  NA 133*  K 3.7  CL 104  CO2 24  GLUCOSE 117*  BUN 25*  CREATININE 1.03*  CALCIUM 7.6*   Liver Function Tests: Recent Labs  Lab 03/30/20 1618  AST 34  ALT 28  ALKPHOS 74  BILITOT 2.2*  PROT 4.3*  ALBUMIN 2.1*    Recent Labs  Lab 03/30/20 1045  AMMONIA 65*   CBG: Recent Labs  Lab 03/30/20 1539  GLUCAP 69*   Radiological Exams on Admission: No results found.  EKG: None.   Assessment/Plan Principal Problem:   Acute on chronic anemia Active Problems:   Cirrhosis, nonalcoholic (HCC)   Diabetes mellitus ([place])   History of esophageal  varices   Thrombocytopenia (HCC)   Hepatic cirrhosis (HCC)   Idiopathic esophageal varices without bleeding (HCC)   Chronic diastolic heart failure (H[facility])   GAVE (gastric antral vascular ectasia)   Encephalopathy, hepatic (HCC)  Acute on chronic  anemia-hemoglobin 5.5, requiring frequent transfusions, hemoglobin 7.8 a week ago after she had just gotten transfusion 1 week prior for hemoglobin of 7.  Reports melena just over a week ago.   Last EGD at [facility] by Dr. Barritt-01/02/2020, normal esophagus, portal hypertensive gastropathy, severe gastric antral vascular ectasia without bleeding , 5 bands were placed..  Plan was for repeat upper endoscopy in a month for rebanding. -2 units PRBC for transfusion -Posttransfusion H&H, CBC in the morning -IV Protonix 40 daily -N.p.o. midnight for now -GI consultation, follows with Dr. Laural Golden and Dr. Marene Lenz at [facility] -IV Lasix 40 mg x 1 with transfusion  Nonalcoholic liver cirrhosis/thrombocytopenia/esophageal varices/hepatic encephalopathy-platelets 49 today, baseline 30s to 40s.  Blood ammonia level 65, stable.  Patient been evaluated by liver transplant team at [facility] everywhere, but patient states she is not quite on the list yet.  Reports 2-3 bowel movements daily, reports compliance with medications rifaximin and lactulose. -Resume rifaximin, lactulose. -Resume Bumex, spironolactone  Controlled diabetes mellitus-random glucose 117, blood glucose down to 69 earlier.  Recent A1c per Care Everywhere 5.4. -Hold home Lantus 10 units - 55u, and Humalog with meals p.m., 55 units a.m - SSi- M  Chronic diastolic heart failure-stable and compensated.  Last echo per care everywhere- 02/2020- 2018 EF > 55%. -Resume Bumex 2 mg daily, spironolactone   DVT prophylaxis: SCDs Code Status: Full code Family Communication: Spouse at bedside. Disposition Plan: 1 to 2 days, after transfusions and pending GI evaluation. Consults called: GI consult. Admission status:  Observation, telemetry   Bethena Roys MD [facility]  03/30/2020, 6:09 PM

## 2020-03-30 NOTE — ED Notes (Signed)
CRITICAL VALUE ALERT  Critical Value:  Hgb 5.5 Date & Time Notied:  03/30/20 1638  Provider Notified: Sharyn Lull  Orders Received/Actions taken: None

## 2020-03-30 NOTE — ED Notes (Signed)
Attempt report 

## 2020-03-30 NOTE — ED Triage Notes (Signed)
Patient has port access due to difficulty stick, last blood transfusion 2 weeks ago.

## 2020-03-30 NOTE — ED Provider Notes (Signed)
[facility] EMERGENCY DEPARTMENT Provider Note   CSN: [id number] Arrival date & time: 03/30/20  1315     History Chief Complaint  Patient presents with  . Abnormal Lab    Hemoglobin    Maria [name] is a 64 y.o. female.  HPI 64 year old female extensive medical history including nonalcoholic liver cirrhosis, esophageal varices, CAD, DM type II, CHF with an EF of 60 to 65% presents to the ER from Dr. Olevia Perches office with gastroenterology.  Patient was noted to have a hemoglobin of 5.1 in the ED.  Per patient, she states that she is chronically anemic secondary to her varices and has her hemoglobin checked daily.  She frequently gets transfusions here in [place].  She said that last week her hemoglobin was 7.8 and they decided to skip her transfusion last week.  Today she has noted that she had hemoglobin of 5.2.  She states that approximately 2 weeks ago she noted some dark tarry stools, however today over the last week they have been normal color.  She is awaiting to be placed on the liver transplant list at [facility].  She endorses fatigue and shortness of breath which is consistent for when her hemoglobin is low.  Denies any chest pain or shortness of breath, nausea, vomiting    Past Medical History:  Diagnosis Date  . Anemia   . CHF (congestive heart failure) ([place])   . Cirrhosis ([place])   . Depression   . Diabetes mellitus   . Esophageal bleed, non-variceal   . Eye hemorrhage    Behind left eye  . Fibromyalgia   . Hypercholesteremia   . Hypertension   . Hypothyroidism   . NAFLD (nonalcoholic fatty liver disease)   . Osteoarthrosis   . Osteoporosis   . PONV (postoperative nausea and vomiting)   . UTI (urinary tract infection) 11/12 end of the month   Patient feels that she passed a kidney stone at that time    Patient Active Problem List   Diagnosis Date Noted  . Acute anemia 09/29/2019  . Hepatic encephalopathy ([place]) 08/27/2019  . Altered mental status 02/27/2019  .  Bacteriuria 02/27/2019  . Acute on chronic anemia 02/24/2019  . Acute hepatic encephalopathy 07/26/2018  . Obesity, Class III, BMI 40-49.9 (morbid obesity) ([place])   . Melena   . Anemia 07/02/2018  . UGI bleed 11/09/2017  . Insulin dependent diabetes mellitus   . Absolute anemia 09/13/2017  . Idiopathic esophageal varices with bleeding ([place]) 09/13/2017  . Other cirrhosis of liver ([place]) 08/22/2017  . NAFLD (nonalcoholic fatty liver disease) 08/14/2017  . Encephalopathy, hepatic ([place]) 08/14/2017  . Metabolic encephalopathy 79/10/4095  . Hyperammonemia ([place]) 07/18/2017  . Hypothyroidism 07/18/2017  . Acute on chronic right-sided congestive heart failure ([place]) 07/06/2017  . Pulmonary hypertension ([place]) 07/06/2017  . GAVE (gastric antral vascular ectasia) 07/06/2017  . GI bleeding 07/06/2017  . Symptomatic anemia 07/04/2017  . Hypokalemia 07/04/2017  . Chronic diastolic heart failure ([place]) 06/11/2017  . CAD (coronary artery disease) 06/11/2017  . Nocturnal hypoxemia 02/22/2017  . Idiopathic esophageal varices without bleeding ([place]) 01/31/2017  . Dyspnea and respiratory abnormalities 01/25/2017  . Esophageal varices in cirrhosis ([place]) 05/19/2016  . Hepatic cirrhosis ([place]) 05/19/2016  . Esophageal varices ([place]) 12/09/2012  . Dysphagia, unspecified(787.20) 12/09/2012  . Anxiety 08/26/2012  . Cirrhosis, nonalcoholic ([place]) 35/32/9924  . Diabetes mellitus ([place]) 10/09/2011  . History of esophageal varices 10/09/2011  . Iron deficiency anemia 10/09/2011  . GERD (gastroesophageal reflux disease) 10/09/2011  .  Thrombocytopenia ([place]) 10/09/2011    Past Surgical History:  Procedure Laterality Date  . APPENDECTOMY  1980  . BILATERAL SALPINGOOPHORECTOMY    . CHOLECYSTECTOMY    . COLONOSCOPY  03/15/2011  . COLONOSCOPY N/A 03/24/2016   Procedure: COLONOSCOPY;  Surgeon: [name], MD;  [facility];  Service: Endoscopy;  Laterality: N/A;  855  . ESOPHAGEAL BANDING  04/24/2012    Procedure: ESOPHAGEAL BANDING;  Surgeon: [name], MD;  Location[facility];  Service: Endoscopy;  Laterality: N/A;  . Esophageal BANDING  08/03/2012   [facility] in [place] , [place]  09/24/2012   Procedure: ESOPHAGEAL BANDING;  Surgeon: [name], MD;  Location: [facility] OR[facility];  Service: Endoscopy;  Laterality: N/A;  Banding x 3  . ESOPHAGEAL BANDING N/A 01/29/2013   Procedure: ESOPHAGEAL BANDING;  Surgeon: [name], MD;  [facility];  Service: Endoscopy;  Laterality: N/A;  . ESOPHAGEAL BANDING N/A 06/19/2014   Procedure: ESOPHAGEAL BANDING;  Surgeon: [name], MD;  [facility];  Service: Endoscopy;  Laterality: N/A;  . ESOPHAGEAL BANDING N/A 01/05/2016   Procedure: ESOPHAGEAL BANDING;  Surgeon: [name], MD;  [facility];  Service: Endoscopy;  Laterality: N/A;  . ESOPHAGEAL BANDING N/A 08/03/2016   Procedure: ESOPHAGEAL BANDING;  Surgeon: [name], MD;  [facility];  Service: Endoscopy;  Laterality: N/A;  . ESOPHAGEAL BANDING N/A 05/02/2017   Procedure: ESOPHAGEAL BANDING;  Surgeon: [name], MD;  [facility];  Service: Endoscopy;  Laterality: N/A;  . ESOPHAGOGASTRODUODENOSCOPY  04/24/2012   Procedure: ESOPHAGOGASTRODUODENOSCOPY (EGD);  Surgeon: [name], MD;  [facility];  Service: Endoscopy;  Laterality: N/A;  300  . ESOPHAGOGASTRODUODENOSCOPY N/A 01/29/2013   Procedure: ESOPHAGOGASTRODUODENOSCOPY (EGD);  Surgeon: [name], MD;  [facility];  Service: Endoscopy;  Laterality: N/A;  1200  . ESOPHAGOGASTRODUODENOSCOPY N/A 05/22/2013   Procedure: ESOPHAGOGASTRODUODENOSCOPY (EGD);  Surgeon: [name], MD;  [facility];  Service: Endoscopy;  Laterality: N/A;  1:55  . ESOPHAGOGASTRODUODENOSCOPY N/A 12/31/2013   Procedure: ESOPHAGOGASTRODUODENOSCOPY (EGD);  Surgeon: [name], MD;  [facility];   Service: Endoscopy;  Laterality: N/A;  1200  . ESOPHAGOGASTRODUODENOSCOPY N/A 06/19/2014   Procedure: ESOPHAGOGASTRODUODENOSCOPY (EGD);  Surgeon: [name], MD;  [facility];  Service: Endoscopy;  Laterality: N/A;  1055  . ESOPHAGOGASTRODUODENOSCOPY N/A 01/15/2015   Procedure: ESOPHAGOGASTRODUODENOSCOPY (EGD);  Surgeon: [name], MD;  [facility];  Service: Endoscopy;  Laterality: N/A;  730 - moved to 9:45 - moved to 1250-Ann notified pt  . ESOPHAGOGASTRODUODENOSCOPY N/A 06/30/2015   Procedure: ESOPHAGOGASTRODUODENOSCOPY (EGD);  Surgeon: [name], MD;  [facility];  Service: Endoscopy;  Laterality: N/A;  1200  . ESOPHAGOGASTRODUODENOSCOPY N/A 01/05/2016   Procedure: ESOPHAGOGASTRODUODENOSCOPY (EGD);  Surgeon: [name], MD;  [facility];  Service: Endoscopy;  Laterality: N/A;  830  . ESOPHAGOGASTRODUODENOSCOPY N/A 08/03/2016   Procedure: ESOPHAGOGASTRODUODENOSCOPY (EGD);  Surgeon: [name], MD;  [facility];  Service: Endoscopy;  Laterality: N/A;  930  . ESOPHAGOGASTRODUODENOSCOPY N/A 05/02/2017   Procedure: ESOPHAGOGASTRODUODENOSCOPY (EGD);  Surgeon: [name], MD;  [facility];  Service: Endoscopy;  Laterality: N/A;  12:00  . ESOPHAGOGASTRODUODENOSCOPY N/A 08/17/2017   Procedure: ESOPHAGOGASTRODUODENOSCOPY (EGD);  Surgeon: [name], MD;  [facility];  Service: Endoscopy;  Laterality: N/A;  8:15  . ESOPHAGOGASTRODUODENOSCOPY N/A 09/12/2017   Procedure: ESOPHAGOGASTRODUODENOSCOPY (EGD);  Surgeon: [name], MD;  [facility];  Service: Endoscopy;  Laterality: N/A;  1040  . ESOPHAGOGASTRODUODENOSCOPY N/A 10/10/2017   Procedure: ESOPHAGOGASTRODUODENOSCOPY (EGD);  Surgeon: [name], MD;  [facility];  Service: Endoscopy;  Laterality: N/A;  225  . ESOPHAGOGASTRODUODENOSCOPY N/A 11/09/2017   Procedure: ESOPHAGOGASTRODUODENOSCOPY (EGD);   Surgeon: [name], MD;  [facility];  Service: Endoscopy;  Laterality: N/A;  . ESOPHAGOGASTRODUODENOSCOPY (EGD) WITH PROPOFOL  09/24/2012   Procedure: ESOPHAGOGASTRODUODENOSCOPY (EGD) WITH PROPOFOL;  Surgeon: [name], MD;  Location: [facility];  Service: Endoscopy;  Laterality: N/A;  GE junction at 36  . ESOPHAGOGASTRODUODENOSCOPY (EGD) WITH PROPOFOL N/A 07/06/2017   Procedure: ESOPHAGOGASTRODUODENOSCOPY (EGD) WITH PROPOFOL;  Surgeon: [name], MD;  [facility];  Service: Endoscopy;  Laterality: N/A;  . ESOPHAGOGASTRODUODENOSCOPY W/ BANDING  08/2010  . GIVENS CAPSULE STUDY N/A 07/05/2017   Procedure: GIVENS CAPSULE STUDY;  Surgeon: [name], MD;  [facility];  Service: Endoscopy;  Laterality: N/A;  . HOT HEMOSTASIS N/A 08/17/2017   Procedure: HOT HEMOSTASIS (ARGON PLASMA COAGULATION/BICAP);  Surgeon: [name], MD;  [facility];  Service: Endoscopy;  Laterality: N/A;  . HOT HEMOSTASIS  09/12/2017   Procedure: HOT HEMOSTASIS (ARGON PLASMA COAGULATION/BICAP);  Surgeon: [name], MD;  [facility];  Service: Endoscopy;;  gastric  . HOT HEMOSTASIS  10/10/2017   Procedure: HOT HEMOSTASIS (ARGON PLASMA COAGULATION/BICAP);  Surgeon: [name], MD;  [facility];  Service: Endoscopy;;  . POLYPECTOMY  03/24/2016   Procedure: POLYPECTOMY;  Surgeon: [name], MD;  [facility];  Service: Endoscopy;;  sigmoid polyp  . PORTACATH PLACEMENT Right 06/02/2019   Procedure: INSERTION PORT-A-CATH;  Surgeon: Aviva Signs, MD;  Location: [facility];  Service: General;  Laterality: Right;  . RIGHT HEART CATH N/A 04/16/2017   Procedure: Right Heart Cath;  Surgeon: [name], MD;  Location: [facility];  Service: Cardiovascular;  Laterality: N/A;  . RIGHT HEART CATH N/A 07/12/2017   Procedure: RIGHT HEART CATH;  Surgeon: [name], MD;  Location: [facility];   Service: Cardiovascular;  Laterality: N/A;  . TONSILLECTOMY    . UPPER GASTROINTESTINAL ENDOSCOPY  03/15/2011   EGD ED BANDING/TCS  . UPPER GASTROINTESTINAL ENDOSCOPY  09/05/2010  . UPPER GASTROINTESTINAL ENDOSCOPY  08/11/2010  . VAGINAL HYSTERECTOMY       OB History   No obstetric history on file.     Family History  Problem Relation Age of Onset  . Lung cancer Mother   . Diabetes Father   . Diabetes Sister   . Hypertension Sister   . Hypothyroidism Sister   . Colon cancer Brother   . Diabetes Sister   . Hypothyroidism Brother   . Healthy Daughter   . Obesity Daughter   . Hypertension Daughter     Social History   Tobacco Use  . Smoking status: Never Smoker  . Smokeless tobacco: Never Used  Vaping Use  . Vaping Use: Never used  Substance Use Topics  . Alcohol use: No    Alcohol/week: 0.0 standard drinks  . Drug use: No    Home Medications Prior to Admission medications   Medication Sig Start Date End Date Taking? Authorizing Provider  bumetanide (BUMEX) 2 MG tablet Take 2  mg by mouth daily.  09/10/19   Rehman, Mechele Dawley, MD  busPIRone (BUSPAR) 10 MG tablet Take 10 mg by mouth 2 (two) times daily.    [provider]  Cholecalciferol 25 MCG (1000 UT) tablet Take 1,000 Units by mouth every other day.     [provider]  HUMALOG 100 UNIT/ML injection Inject 15-35 Units into the skin 3 (three) times daily with meals. Sliding scale insulin 100-150= 15 units 150-200= 20 units 250-300= 30 units 300-350= 35 units 07/01/18   [provider]  Insulin Glargine (LANTUS SOLOSTAR) 100 UNIT/ML Solostar Pen Inject 10-55 Units into the skin See admin instructions. Inject 55 units subcutaneously in the morning & 10 units subcutaneously at night, if needed 03/06/19   [provider]  levothyroxine (SYNTHROID, LEVOTHROID) 25 MCG tablet Take 25 mcg by mouth daily before breakfast.     [provider]  lidocaine-prilocaine (EMLA) cream  Apply 1 application topically daily as needed. 08/19/19   [provider]  magnesium oxide (MAG-OX) 400 MG tablet Take 400 mg by mouth daily.     [provider]  pantoprazole (PROTONIX) 40 MG tablet Take 1 tablet (40 mg total) by mouth daily before breakfast. 09/09/19   Rehman, Mechele Daw[place], MD  Pediatric Multivitamins-Iron (FLINTSTONES PLUS IRON PO) Take 2 tablets by mouth daily.    [provider]  potassium chloride SA (KLOR-CON) 20 MEQ tablet Take 2 tablets (40 mEq total) by mouth daily. Patient taking differently: Take 20 mEq by mouth 2 (two) times daily.  09/02/19   Enzo Bi, MD  rifaximin (XIFAXAN) 550 MG TABS tablet Take 1 tablet (550 mg total) by mouth 2 (two) times daily. 02/27/19   Barton Dubois, MD  rosuvastatin (CRESTOR) 10 MG tablet Take 10 mg by mouth daily. 09/10/19   [provider]  sertraline (ZOLOFT) 50 MG tablet Take 50 mg by mouth daily.  03/10/19   [provider]  spironolactone (ALDACTONE) 100 MG tablet Take 1 tablet (100 mg total) by mouth daily. 10/06/19 12/01/19  Manuella Ghazi, Pratik D, DO  trimethoprim-polymyxin b (POLYTRIM) ophthalmic solution Place 1 drop into the left eye See admin instructions. Use 3 to 4 times daily for 2 days following monthly eye injection 05/06/18   [provider]  zinc sulfate 220 (50 Zn) MG capsule Take 1 capsule (220 mg total) by mouth daily. 08/01/18   Barton Dubois, MD    Allergies    Ferumoxytol and Tramadol hcl  Review of Systems   Review of Systems  Constitutional: Negative for chills and fever.  HENT: Negative for ear pain and sore throat.   Eyes: Negative for pain and visual disturbance.  Respiratory: Negative for cough and shortness of breath.   Cardiovascular: Negative for chest pain and palpitations.  Gastrointestinal: Negative for abdominal pain and vomiting.  Genitourinary: Negative for dysuria and hematuria.  Musculoskeletal: Negative for arthralgias and back pain.  Skin: Negative  for color change and rash.  Neurological: Positive for dizziness and weakness. Negative for seizures, syncope and headaches.  Psychiatric/Behavioral: Negative for confusion.  All other systems reviewed and are negative.   Physical Exam Updated Vital Signs BP (!) 125/31 (BP Location: Right Arm)   Pulse 80   Temp 98.3 F (36.8 C) (Oral)   Resp 20   Ht 5' 4"  (1.626 m)   Wt 83.5 kg   SpO2 100%   BMI 31.58 kg/m   Physical Exam Vitals and nursing note reviewed.  Constitutional:  General: She is not in acute distress.    Appearance: Normal appearance. She is well-developed. She is ill-appearing.     Comments: Pale, chronically ill-appearing  HENT:     Head: Normocephalic and atraumatic.  Eyes:     Conjunctiva/sclera: Conjunctivae normal.     Pupils: Pupils are equal, round, and reactive to light.     Comments: Pale conjunctive a  Cardiovascular:     Rate and Rhythm: Normal rate and regular rhythm.     Pulses: Normal pulses.     Heart sounds: Normal heart sounds. No murmur heard.   Pulmonary:     Effort: Pulmonary effort is normal. No respiratory distress.     Breath sounds: Normal breath sounds.  Abdominal:     General: Abdomen is flat.     Palpations: Abdomen is soft.     Tenderness: There is no abdominal tenderness.  Musculoskeletal:        General: Normal range of motion.     Cervical back: Neck supple.  Skin:    General: Skin is warm and dry.     Coloration: Skin is pale.  Neurological:     General: No focal deficit present.     Mental Status: She is alert and oriented to person, place, and time.     Sensory: No sensory deficit.     Motor: No weakness.  Psychiatric:        Mood and Affect: Mood normal.        Behavior: Behavior normal.     ED Results / Procedures / Treatments   Labs (all labs ordered are listed, but only abnormal results are displayed) Labs Reviewed  CBC WITH DIFFERENTIAL/PLATELET - Abnormal; Notable for the following components:       Result Value   WBC 3.0 (*)    RBC 2.10 (*)    Hemoglobin 5.5 (*)    HCT 19.5 (*)    MCHC 28.2 (*)    RDW 19.9 (*)    Platelets 49 (*)    Lymphs Abs 0.6 (*)    All other components within normal limits  COMPREHENSIVE METABOLIC PANEL - Abnormal; Notable for the following components:   Sodium 133 (*)    Glucose, Bld 117 (*)    BUN 25 (*)    Creatinine, Ser 1.03 (*)    Calcium 7.6 (*)    Total Protein 4.3 (*)    Albumin 2.1 (*)    Total Bilirubin 2.2 (*)    GFR calc non Af Amer 57 (*)    All other components within normal limits  CBG MONITORING, ED - Abnormal; Notable for the following components:   Glucose-Capillary 69 (*)    All other components within normal limits  TYPE AND SCREEN  PREPARE RBC (CROSSMATCH)    EKG None  Radiology No results found.  Procedures Procedures (including critical care time)  Medications Ordered in ED Medications  0.9 %  sodium chloride infusion (Manually program via Guardrails IV Fluids) (has no administration in time range)    ED Course  I have reviewed the triage vital signs and the nursing notes.  Pertinent labs & imaging results that were available during my care of the patient were reviewed by me and considered in my medical decision making (see chart for details).    MDM Rules/Calculators/A&P                         64 year old female sent from Dr. Laural Golden office  with a noted hemoglobin of 5.2.  She has a history of chronic anemia with frequent transfusions.  Blood pressure on arrival was 125/31, per chart review she does tend to chronically have low blood pressures with her diastolic often in the 16X.  On my reevaluation, her diastolic had improved to 45. CBC with mild leukopenia of 3, most notably hemoglobin of 5.5 and platelets of 49.  CMP with mild hyponatremia, creatinine today is 1.03 and BUN of 25 which is actually slightly improved from previous values.  CBG of 69 was noted, she was given ginger ale and cranberry juice.  Ordered  2 units of blood for transfusion, patient typed and screened.  Consulted Dr. Denton Brick with the hospitalist group, patient will be admitted for further evaluation and management of her blood transfusion.  She has remained hemodynamically stable here in the ED.  Final Clinical Impression(s) / ED Diagnoses Final diagnoses:  Symptomatic anemia    Rx / DC Orders ED Discharge Orders    None       Lyndel Safe 03/30/20 1702    Veryl Speak, MD 03/30/20 2258

## 2020-03-30 NOTE — Unmapped (Signed)
On call page received from Hildred Priest - spouse states patient @ [facility] with Hgb 5.2 (s/p t units blood 2 weeks ago and a reported hgb of >7 one week ago.    Advised that I would attempt to move upcoming EGD up and that patient due to be discussed @ [facility] 7/14. Spouse aware that given MELD >20, unlikely she would be actively listed @ this time despite her need for multiple blood transfusions. He remains very frustrated by this and I assured him it would be discussed with Team / MD.

## 2020-03-31 DIAGNOSIS — D649 Anemia, unspecified: Secondary | ICD-10-CM | POA: Diagnosis not present

## 2020-03-31 LAB — CBC
HCT: 23.9 % — ABNORMAL LOW (ref 36.0–46.0)
Hemoglobin: 7.2 g/dL — ABNORMAL LOW (ref 12.0–15.0)
MCH: 27.6 pg (ref 26.0–34.0)
MCHC: 30.1 g/dL (ref 30.0–36.0)
MCV: 91.6 fL (ref 80.0–100.0)
Platelets: 49 10*3/uL — ABNORMAL LOW (ref 150–400)
RBC: 2.61 MIL/uL — ABNORMAL LOW (ref 3.87–5.11)
RDW: 17.9 % — ABNORMAL HIGH (ref 11.5–15.5)
WBC: 2.7 10*3/uL — ABNORMAL LOW (ref 4.0–10.5)
nRBC: 0 % (ref 0.0–0.2)

## 2020-03-31 LAB — HEMOGLOBIN AND HEMATOCRIT, BLOOD
HCT: 21.6 % — ABNORMAL LOW (ref 36.0–46.0)
HCT: 24.9 % — ABNORMAL LOW (ref 36.0–46.0)
Hemoglobin: 6.4 g/dL — CL (ref 12.0–15.0)
Hemoglobin: 7.7 g/dL — ABNORMAL LOW (ref 12.0–15.0)

## 2020-03-31 LAB — GLUCOSE, CAPILLARY
Glucose-Capillary: 122 mg/dL — ABNORMAL HIGH (ref 70–99)
Glucose-Capillary: 143 mg/dL — ABNORMAL HIGH (ref 70–99)
Glucose-Capillary: 170 mg/dL — ABNORMAL HIGH (ref 70–99)
Glucose-Capillary: 296 mg/dL — ABNORMAL HIGH (ref 70–99)

## 2020-03-31 LAB — HIV ANTIBODY (ROUTINE TESTING W REFLEX): HIV Screen 4th Generation wRfx: NONREACTIVE

## 2020-03-31 LAB — PREPARE RBC (CROSSMATCH)

## 2020-03-31 MED ORDER — SODIUM CHLORIDE 0.9% IV SOLUTION: Freq: Once | INTRAVENOUS | Status: DC

## 2020-03-31 MED ORDER — FUROSEMIDE 10 MG/ML IJ SOLN: 20.0000 mg | Freq: Once | INTRAMUSCULAR | Status: DC

## 2020-03-31 NOTE — Plan of Care (Signed)

## 2020-03-31 NOTE — Care Management Obs Status (Signed)
[facility] NOTIFICATION   Patient Details  Name: Maria [name] MRN: [id number] Date of Birth: 03-16-1956   Medicare Observation Status Notification Given:       Laurena Slimmer, RN 03/31/2020, 9:26 PM

## 2020-03-31 NOTE — Discharge Summary (Signed)
Maria [name], is a 64 y.o. female  DOB 05-Nov-1955  MRN [id number].  Admission date:  03/30/2020  Admitting Physician  Morgann Woodburn Denton Brick, MD  Discharge Date:  03/31/2020   Primary MD  Glenda Chroman, MD  Recommendations for primary care physician for things to follow:     Admission Diagnosis  Symptomatic anemia [D64.9] Acute on chronic anemia [D64.9]   Discharge Diagnosis  Symptomatic anemia [D64.9] Acute on chronic anemia [D64.9]    Principal Problem:   Acute on chronic anemia Active Problems:   Symptomatic anemia   Cirrhosis, nonalcoholic (HCC)   Diabetes mellitus ([place])   History of esophageal varices   Thrombocytopenia (HCC)   Hepatic cirrhosis (HCC)   Idiopathic esophageal varices without bleeding (HCC)   Chronic diastolic heart failure (HCC)   GAVE (gastric antral vascular ectasia)   Encephalopathy, hepatic (HCC)      Past Medical History:  Diagnosis Date  . Anemia   . CHF (congestive heart failure) ([place])   . Cirrhosis ([place])   . Depression   . Diabetes mellitus   . Esophageal bleed, non-variceal   . Eye hemorrhage    Behind left eye  . Fibromyalgia   . Hypercholesteremia   . Hypertension   . Hypothyroidism   . NAFLD (nonalcoholic fatty liver disease)   . Osteoarthrosis   . Osteoporosis   . PONV (postoperative nausea and vomiting)   . UTI (urinary tract infection) 11/12 end of the month   Patient feels that she passed a kidney stone at that time    Past Surgical History:  Procedure Laterality Date  . APPENDECTOMY  1980  . BILATERAL SALPINGOOPHORECTOMY    . CHOLECYSTECTOMY    . COLONOSCOPY  03/15/2011  . COLONOSCOPY N/A 03/24/2016   Procedure: COLONOSCOPY;  Surgeon: [name], MD;  [facility];  Service: Endoscopy;  Laterality: N/A;  855  . ESOPHAGEAL BANDING  04/24/2012   Procedure: ESOPHAGEAL BANDING;  Surgeon: [name], MD;  [facility];  Service: Endoscopy;  Laterality: N/A;  . Esophageal BANDING  08/03/2012   [facility] [place] [facility] [place]  09/24/2012   Procedure: ESOPHAGEAL BANDING;  Surgeon: [name], MD;  [facility]: [facility];  Service: Endoscopy;  Laterality: N/A;  Banding x 3  . ESOPHAGEAL BANDING N/A 01/29/2013   Procedure: ESOPHAGEAL BANDING;  Surgeon: [name], MD;  [facility];  Service: Endoscopy;  Laterality: N/A;  . ESOPHAGEAL BANDING N/A 06/19/2014   Procedure: ESOPHAGEAL BANDING;  Surgeon: [name], MD;  [facility];  Service: Endoscopy;  Laterality: N/A;  . ESOPHAGEAL BANDING N/A 01/05/2016   Procedure: ESOPHAGEAL BANDING;  Surgeon: [name], MD;  [facility];  Service: Endoscopy;  Laterality: N/A;  . ESOPHAGEAL BANDING N/A 08/03/2016   Procedure: ESOPHAGEAL BANDING;  Surgeon: [name], MD;  [facility];  Service: Endoscopy;  Laterality: N/A;  . ESOPHAGEAL BANDING N/A 05/02/2017  Procedure: ESOPHAGEAL BANDING;  Surgeon: [name], MD;  [facility];  Service: Endoscopy;  Laterality: N/A;  . ESOPHAGOGASTRODUODENOSCOPY  04/24/2012   Procedure: ESOPHAGOGASTRODUODENOSCOPY (EGD);  Surgeon: [name], MD;  [facility];  Service: Endoscopy;  Laterality: N/A;  300  . ESOPHAGOGASTRODUODENOSCOPY N/A 01/29/2013   Procedure: ESOPHAGOGASTRODUODENOSCOPY (EGD);  Surgeon: [name], MD;  [facility];  Service: Endoscopy;  Laterality: N/A;  1200  . ESOPHAGOGASTRODUODENOSCOPY N/A 05/22/2013   Procedure: ESOPHAGOGASTRODUODENOSCOPY (EGD);  Surgeon: [name], MD;  [facility];  Service: Endoscopy;  Laterality: N/A;  1:55  . ESOPHAGOGASTRODUODENOSCOPY N/A 12/31/2013   Procedure: ESOPHAGOGASTRODUODENOSCOPY (EGD);  Surgeon: [name], MD;  [facility];  Service: Endoscopy;  Laterality: N/A;  1200  . ESOPHAGOGASTRODUODENOSCOPY N/A  06/19/2014   Procedure: ESOPHAGOGASTRODUODENOSCOPY (EGD);  Surgeon: [name], MD;  [facility];  Service: Endoscopy;  Laterality: N/A;  1055  . ESOPHAGOGASTRODUODENOSCOPY N/A 01/15/2015   Procedure: ESOPHAGOGASTRODUODENOSCOPY (EGD);  Surgeon: [name], MD;  [facility];  Service: Endoscopy;  Laterality: N/A;  730 - moved to 9:45 - moved to 1250-Ann notified pt  . ESOPHAGOGASTRODUODENOSCOPY N/A 06/30/2015   Procedure: ESOPHAGOGASTRODUODENOSCOPY (EGD);  Surgeon: [name], MD;  [facility];  Service: Endoscopy;  Laterality: N/A;  1200  . ESOPHAGOGASTRODUODENOSCOPY N/A 01/05/2016   Procedure: ESOPHAGOGASTRODUODENOSCOPY (EGD);  Surgeon: [name], MD;  [facility];  Service: Endoscopy;  Laterality: N/A;  830  . ESOPHAGOGASTRODUODENOSCOPY N/A 08/03/2016   Procedure: ESOPHAGOGASTRODUODENOSCOPY (EGD);  Surgeon: [name], MD;  [facility];  Service: Endoscopy;  Laterality: N/A;  930  . ESOPHAGOGASTRODUODENOSCOPY N/A 05/02/2017   Procedure: ESOPHAGOGASTRODUODENOSCOPY (EGD);  Surgeon: [name], MD;  [facility];  Service: Endoscopy;  Laterality: N/A;  12:00  . ESOPHAGOGASTRODUODENOSCOPY N/A 08/17/2017   Procedure: ESOPHAGOGASTRODUODENOSCOPY (EGD);  Surgeon: [name], MD;  [facility];  Service: Endoscopy;  Laterality: N/A;  8:15  . ESOPHAGOGASTRODUODENOSCOPY N/A 09/12/2017   Procedure: ESOPHAGOGASTRODUODENOSCOPY (EGD);  Surgeon: [name], MD;  [facility];  Service: Endoscopy;  Laterality: N/A;  1040  . ESOPHAGOGASTRODUODENOSCOPY N/A 10/10/2017   Procedure: ESOPHAGOGASTRODUODENOSCOPY (EGD);  Surgeon: [name], MD;  [facility];  Service: Endoscopy;  Laterality: N/A;  225  . ESOPHAGOGASTRODUODENOSCOPY N/A 11/09/2017   Procedure: ESOPHAGOGASTRODUODENOSCOPY (EGD);  Surgeon: [name], MD;  [facility];  Service: Endoscopy;   Laterality: N/A;  . ESOPHAGOGASTRODUODENOSCOPY (EGD) WITH PROPOFOL  09/24/2012   Procedure: ESOPHAGOGASTRODUODENOSCOPY (EGD) WITH PROPOFOL;  Surgeon: [name], MD;  [facility];  Service: Endoscopy;  Laterality: N/A;  GE junction at 36  . ESOPHAGOGASTRODUODENOSCOPY (EGD) WITH PROPOFOL N/A 07/06/2017   Procedure: ESOPHAGOGASTRODUODENOSCOPY (EGD) WITH PROPOFOL;  Surgeon: [name], MD;  [facility];  Service: Endoscopy;  Laterality: N/A;  . ESOPHAGOGASTRODUODENOSCOPY W/ BANDING  08/2010  . GIVENS CAPSULE STUDY N/A 07/05/2017   Procedure: GIVENS CAPSULE STUDY;  Surgeon: [name], MD;  [facility];  Service: Endoscopy;  Laterality: N/A;  . HOT HEMOSTASIS N/A 08/17/2017   Procedure: HOT HEMOSTASIS (ARGON PLASMA COAGULATION/BICAP);  Surgeon: [name], MD;  [facility];  Service: Endoscopy;  Laterality: N/A;  . HOT HEMOSTASIS  09/12/2017   Procedure: HOT HEMOSTASIS (ARGON PLASMA COAGULATION/BICAP);  Surgeon: [name], MD;  [facility];  Service: Endoscopy;;  gastric  . HOT HEMOSTASIS  10/10/2017   Procedure: HOT HEMOSTASIS (ARGON PLASMA COAGULATION/BICAP);  Surgeon: [name], MD;  [facility];  Service: Endoscopy;;  . POLYPECTOMY  03/24/2016   Procedure: POLYPECTOMY;  Surgeon: [name], MD;  [facility];  Service: Endoscopy;;  sigmoid polyp  . PORTACATH PLACEMENT Right 06/02/2019   Procedure: INSERTION PORT-A-CATH;  Surgeon: Aviva Signs, MD;  [facility];  Service: General;  Laterality: Right;  . RIGHT HEART CATH N/A 04/16/2017   Procedure: Right Heart Cath;  Surgeon: [name], MD;  Location: [facility];  Service: Cardiovascular;  Laterality: N/A;  . RIGHT HEART CATH N/A 07/12/2017   Procedure: RIGHT HEART CATH;  Surgeon: [name], MD;  Location: [facility];  Service: Cardiovascular;  Laterality: N/A;  . TONSILLECTOMY    . UPPER  GASTROINTESTINAL ENDOSCOPY  03/15/2011   EGD ED BANDING/TCS  . UPPER GASTROINTESTINAL ENDOSCOPY  09/05/2010  . UPPER GASTROINTESTINAL ENDOSCOPY  08/11/2010  . VAGINAL HYSTERECTOMY       HPI  from the history and physical done on the day of admission:    HPI: Maria [name] is a 64 y.o. female with medical history significant for nonalcoholic liver cirrhosis with thrombocytopenia, hepatic encephalopathy and esophageal varices, diastolic CHF, diabetes mellitus, GAVE, pulmonary hypertension. Patient was sent to the ER today after outpatient blood work at Dr. Laural Golden, showed a hemoglobin of 5.5.  Patient gets routine blood transfusions, last transfusion was just about 2 weeks ago,03/18/2020.  Patient reports black stools just over a week ago, she is unable to tell me how many days or how many times as she normally has loose stools from lactulose.  She denies vomiting of blood, no blood in stools.  She does not take NSAIDs. She reports mild difficulty breathing with activity over the past few days, which normally happens when her blood counts are low.  No chest pain.  ED Course: Blood pressure 125/31, heart rate 80s.  Hemoglobin 5.5, WBC 3, platelets 49.  2 units PRBC ordered for transfusion.  Hospitalist to admit for acute on chronic anemia.      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  Last blood transfusion was 03/17/20 -Admitted on 03/30/2020 with hemoglobin of 5.2 and dizziness, dyspnea on exertion and fatigue -Overall improved after transfusion of a total of 3 units of PRBCs  A/p 1)Acute on chronic symptomatic anemia due to ongoing acute on chronic blood loss anemia ---  patient was admitted with dizziness, fatigue and dyspnea on exertion, ---secondary to acute blood  loss in the setting of ongoing GI bleed-- -- hemoglobin up to 7.7 from 5.2 after transfusion of 3 units of PRBCs, -h/o GAVE --Status post multiple endoscopies with banding and finding of portal hypertensive gastropathy and severe GAVE -Dizziness, dyspnea on exertion and fatigue improved after transfusion -Dr. Laural Golden from GI service recommends transfusion of PRBCs Last EGD was January 02, 2020 -No endoscopic procedures planned for now  2)NALFD--follows up with [facility] hepatology transplant clinic, continue lactulose, Aldactone Bumex and rifaximin -Not encephalopathic at this time  3)HFpEF--patient with stable chronic diastolic dysfunction CHF, appears compensated, continue Aldactone and Bumex, last known EF 55%  4)DM2-A1c 5.4 reflecting excellent DM control PTA -Avoid over aggressive control   5)  chronic thrombocytopenia--- secondary to #2 above, monitor  Disposition--- Home in stable condition  Follow UP--GI physician and PCP for biweekly transfusions PRBCs    Consults obtained -GI  Diet and Activity recommendation:  As advised  Discharge Instructions    Discharge Instructions    Call MD for:  difficulty breathing, headache or visual disturbances   Complete by: As directed    Call MD for:  persistant dizziness or light-headedness   Complete by: As directed    Call MD for:  persistant nausea and vomiting   Complete by: As directed    Call MD for:  temperature >100.4   Complete by: As directed    Diet - low sodium heart healthy   Complete by: As directed    Discharge instructions   Complete by: As directed    1)Please follow-up with GI service/Dr. Hassan Buckler Monday, 04/05/2020 for repeat CBC and CMP test  2)Avoid ibuprofen/Advil/Aleve/Motrin/Goody Powders/Naproxen/BC powders/Meloxicam/Diclofenac/Indomethacin and other Nonsteroidal anti-inflammatory medications as these will make you more likely to bleed and can cause stomach ulcers, can also cause Kidney problems.   Increase  activity slowly   Complete by: As directed         Discharge Medications     Allergies as of 03/31/2020      Reactions   Ferumoxytol Other (See Comments)   Patient has severe back and chest pain when receiving FERAHEME infusions.   Tramadol Hcl Other (See Comments)   WEAKNESS      Medication List    TAKE these medications   bumetanide 2 MG tablet Commonly known as: BUMEX Take 2 mg by mouth 2 (two) times daily.   busPIRone 10 MG tablet Commonly known as: BUSPAR Take 10 mg by mouth 2 (two) times daily.   Cholecalciferol 25 MCG (1000 UT) tablet Take 1,000 Units by mouth every other day.   FLINTSTONES PLUS IRON PO Take 2 tablets by mouth daily.   HumaLOG 100 UNIT/ML injection Generic drug: insulin lispro Inject 15-35 Units into the skin 3 (three) times daily with meals. Sliding scale insulin 100-150= 15 units 150-200= 20 units 250-300= 30 units 300-350= 35 units   lactulose 10 GM/15ML solution Commonly known as: CHRONULAC Take 60 mLs by mouth in the morning, at noon, in the evening, and at bedtime. *If having watery stools more than 3 times daily, reduce to taking 3 times daily   Lantus SoloStar 100 UNIT/ML Solostar Pen Generic drug: insulin glargine Inject 10-60 Units into the skin See admin instructions. Inject 55 units subcutaneously in the morning & 10 units subcutaneously  if needed, in addition   levothyroxine 25 MCG tablet Commonly known as: SYNTHROID Take 25 mcg by mouth daily before breakfast.   lidocaine-prilocaine cream Commonly known as: EMLA Apply 1 application topically daily as needed (applied to port).   magnesium oxide 400 MG tablet Commonly known as: MAG-OX Take 400 mg by mouth at bedtime.   pantoprazole 40 MG tablet Commonly known as: PROTONIX Take 1 tablet (40 mg total) by mouth daily before breakfast.   potassium chloride SA 20 MEQ tablet Commonly known as: KLOR-CON Take 2 tablets (40 mEq total) by mouth daily. What changed:   how  much to take  when to take this   rifaximin 550 MG Tabs tablet Commonly known as: XIFAXAN Take 1 tablet (550 mg total) by mouth 2 (two) times daily.   rosuvastatin 10 MG tablet Commonly known as: CRESTOR Take 10 mg by mouth daily.   sertraline 50 MG tablet Commonly  known as: ZOLOFT Take 50 mg by mouth daily.   spironolactone 100 MG tablet Commonly known as: ALDACTONE Take 1 tablet (100 mg total) by mouth daily.   trimethoprim-polymyxin b ophthalmic solution Commonly known as: POLYTRIM Place 1 drop into the left eye See admin instructions. Use 3 to 4 times daily for 2 days following monthly eye injection   zinc sulfate 220 (50 Zn) MG capsule Take 1 capsule (220 mg total) by mouth daily.       Major procedures and Radiology Reports - PLEASE review detailed and final reports for all details, in brief -   No results found.  Micro Results    Recent Results (from the past 240 hour(s))  SARS Coronavirus 2 by RT PCR (hospital order, performed in [facility] hospital lab) Nasopharyngeal Nasopharyngeal Swab     Status: None   Collection Time: 03/30/20  5:13 PM   Specimen: Nasopharyngeal Swab  Result Value Ref Range Status   SARS Coronavirus 2 NEGATIVE NEGATIVE Final    Comment: (NOTE) SARS-CoV-2 target nucleic acids are NOT DETECTED.  The SARS-CoV-2 RNA is generally detectable in upper and lower respiratory specimens during the acute phase of infection. The lowest concentration of SARS-CoV-2 viral copies this assay can detect is 250 copies / mL. A negative result does not preclude SARS-CoV-2 infection and should not be used as the sole basis for treatment or other patient management decisions.  A negative result may occur with improper specimen collection / handling, submission of specimen other than nasopharyngeal swab, presence of viral mutation(s) within the areas targeted by this assay, and inadequate number of viral copies (<250 copies / mL). A negative result must  be combined with clinical observations, patient history, and epidemiological information.  Fact Sheet for Patients:   StrictlyIdeas.no  Fact Sheet for Healthcare Providers: BankingDealers.co.za  This test is not yet approved or  cleared by the [place] FDA and has been authorized for detection and/or diagnosis of SARS-CoV-2 by FDA under an Emergency Use Authorization (EUA).  This EUA will remain in effect (meaning this test can be used) for the duration of the COVID-19 declaration under Section 564(b)(1) of the Act, 21 U.S.C. section 360bbb-3(b)(1), unless the authorization is terminated or revoked sooner.  Performed at [facility], [address] [place], [place] [address]        Today   Subjective    Maria [name] today has no new complaints -Husband at bedside -Patient is very eager to go home after transfusion of third units of PRBCs at GI physician's recommendation         Patient has been seen and examined prior to discharge   Objective   Blood pressure (!) 113/38, pulse 80, temperature 98.3 F (36.8 C), temperature source Oral, resp. rate 18, height 5' 3"  (1.6 m), weight 86 kg, SpO2 98 %.   Intake/Output Summary (Last 24 hours) at 03/31/2020 2000 Last data filed at 03/31/2020 1900 Gross per 24 hour  Intake 555 ml  Output 900 ml  Net -345 ml    Exam Gen:- Awake Alert, tired looking/chronically ill-appearing HEENT:- [place].AT, No sclera icterus Neck-Supple Neck,No JVD,.  Lungs-  CTAB , fair symmetrical air movement CV- S1, S2 normal, regular  Abd-  +ve B.Sounds, Abd Soft,  mostly resolved epigastric discomfort without rebound or guarding  extremity/Skin:-Trace edema, pedal pulses present  Psych-affect is appropriate, oriented x3 Neuro-generalized weakness, no new focal deficits, no tremors   Data Review   CBC w Diff:  Lab Results  Component Value Date   WBC 2.7 (L) 03/31/2020   HGB 7.2 (L)  03/31/2020   HGB 8.0 (L) 11/13/2018   HCT 23.9 (L) 03/31/2020   HCT 27.3 (L) 11/13/2018   PLT 49 (L) 03/31/2020   LYMPHOPCT 18 03/30/2020   BANDSPCT 0 07/05/2017   MONOPCT 14 03/30/2020   EOSPCT 3 03/30/2020   BASOPCT 0 03/30/2020    CMP:  Lab Results  Component Value Date   NA 133 (L) 03/30/2020   NA 142 10/14/2018   K 3.7 03/30/2020   CL 104 03/30/2020   CO2 24 03/30/2020   BUN 25 (H) 03/30/2020   BUN 15 10/14/2018   CREATININE 1.03 (H) 03/30/2020   CREATININE 0.69 10/22/2014   PROT 4.3 (L) 03/30/2020   ALBUMIN 2.1 (L) 03/30/2020   BILITOT 2.2 (H) 03/30/2020   ALKPHOS 74 03/30/2020   AST 34 03/30/2020   ALT 28 03/30/2020  .   Total Discharge time is about 33 minutes  Roxan Hockey M.D on 03/31/2020 at 8:00 PM  Go to [email or web address] -  for contact info  [facility] - Office  [phone]

## 2020-03-31 NOTE — Progress Notes (Signed)
Patient Demographics:    Maria [name], is a 64 y.o. female, DOB - 05/06/1956, EHM:[id number]  Admit date - 03/30/2020   Admitting Physician Ejiroghene Arlyce Dice, MD  Outpatient Primary MD for the patient is Glenda Chroman, MD  LOS - 0   Chief Complaint  Patient presents with  . 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   Last blood transfusion was 03/17/20 -Admitted on 03/30/2020 with hemoglobin of 5.2 and dizziness, dyspnea on exertion and fatigue  A/p 1)Acute on chronic symptomatic anemia--- continues with dizziness, fatigue and dyspnea on exertion, ---secondary to acute blood loss in the setting of ongoing GI bleed-- -- hemoglobin up to 6.4 from 5.2 after transfusion of 2 units of PRBCs, -h/o GAVE --Status post multiple endoscopies with banding and finding of portal hypertensive gastropathy and severe GAVE -Dizziness, dyspnea on exertion and fatigue persist -Dr. Laural Golden from GI service recommends additional transfusion of PRBCs Last EGD was January 02, 2020  2)NALFD--follows up with [facility] hepatology transplant clinic, continue  lactulose, Aldactone Bumex and rifaximin -Not encephalopathic at this time  3)HFpEF--patient with stable chronic diastolic dysfunction CHF, appears compensated, continue Aldactone and Bumex, last known EF 55%  4)DM2-A1c 5.4 reflecting excellent DM control PTA Use Novolog/Humalog Sliding scale insulin with Accu-Cheks/Fingersticks as ordered   5) chronic thrombocytopenia--- secondary to #2 above, monitor   Disposition/Need for in-Hospital Stay- patient unable to be discharged at this time due to --- symptomatic anemia with dizziness, fatigue, dyspnea on exertion in the patient with recurrent GI bleed requiring further transfusion of PRBCs  Status is: Inpatient  Remains inpatient appropriate because:symptomatic anemia with dizziness, fatigue, dyspnea on exertion in the patient with recurrent GI bleed requiring further transfusion of PRBCs   Disposition: The patient is from: Home              Anticipated d/c is to: Home              Anticipated d/c date is: 2 days              Patient currently is not medically stable to d/c.  Barriers: Not Clinically Stable- -symptomatic anemia with dizziness, fatigue, dyspnea on exertion in the patient with recurrent GI bleed requiring further transfusion of PRBCs  Code Status : full  Family Communication:   (patient is alert, awake and coherent)   Consults  :  Gi  DVT Prophylaxis  :   SCDs /  Gi bleed  Lab Results  Component Value Date   PLT 49 (L) 03/31/2020    Inpatient Medications  Scheduled Meds: . sodium chloride   Intravenous Once  . bumetanide  2 mg Oral BID  . busPIRone  10 mg Oral BID  . furosemide  20 mg Intravenous Once  . insulin aspart  0-15 Units Subcutaneous Q6H  . lactulose  40 g Oral TID  . levothyroxine  25 mcg Oral QAC breakfast  . magnesium oxide  400 mg Oral QHS  . pantoprazole (PROTONIX) IV  40 mg Intravenous Q24H  . potassium chloride SA  20 mEq Oral BID  . rifaximin  550 mg Oral BID  . rosuvastatin  10 mg Oral  Daily  . sertraline  50 mg Oral Daily  . spironolactone  100 mg Oral Daily   Continuous Infusions: PRN Meds:.acetaminophen **OR** acetaminophen, ondansetron **OR** ondansetron (ZOFRAN) IV    Anti-infectives (From admission, onward)   Start     Dose/Rate Route Frequency Ordered Stop   03/30/20 2200  rifaximin (XIFAXAN) tablet 550 mg     Discontinue     550 mg Oral 2 times daily 03/30/20 2030          Objective:   Vitals:   03/31/20 0437 03/31/20 0830 03/31/20 0956 03/31/20 1230  BP: (!) 119/43 (!) 118/38  (!) 126/32  Pulse: 73 73  81  Resp: 18 18  18   Temp: 98.5 F (36.9 C) 98.6 F (37 C)  98.2 F (36.8 C)  TempSrc:  Oral  Oral  SpO2: 97% 95%  96%  Weight:   86 kg   Height:        Wt Readings from Last 3 Encounters:  03/31/20 86 kg  03/17/20 83.9 kg  12/09/19 79.6 kg    Intake/Output Summary (Last 24 hours) at 03/31/2020 1620 Last data filed at 03/31/2020 0500 Gross per 24 hour  Intake 315 ml  Output 400 ml  Net -85 ml    Physical Exam  Gen:- Awake Alert, tired looking/chronically ill-appearing HEENT:- [place].AT, No sclera icterus Neck-Supple Neck,No JVD,.  Lungs-  CTAB , fair symmetrical air movement CV- S1, S2 normal, regular  Abd-  +ve B.Sounds, Abd Soft, epigastric discomfort without rebound or guarding  extremity/Skin:-Trace edema, pedal pulses present  Psych-affect is appropriate, oriented x3 Neuro-generalized weakness, no new focal deficits, no tremors   Data Review:   Micro Results Recent Results (from the past 240 hour(s))  SARS Coronavirus 2 by RT PCR (hospital order, performed in [facility] hospital lab) Nasopharyngeal Nasopharyngeal Swab     Status: None   Collection Time: 03/30/20  5:13 PM   Specimen: Nasopharyngeal Swab  Result Value Ref Range Status   SARS Coronavirus 2 NEGATIVE NEGATIVE Final    Comment: (NOTE) SARS-CoV-2 target nucleic acids are NOT DETECTED.  The SARS-CoV-2 RNA is generally detectable in upper and lower respiratory  specimens during the acute phase of infection. The lowest concentration of SARS-CoV-2 viral copies this assay can detect is 250 copies / mL. A negative result does not preclude SARS-CoV-2 infection and should not be used as the sole basis for treatment or other patient management decisions.  A negative result may occur with improper specimen collection / handling, submission of specimen other than nasopharyngeal swab, presence of viral mutation(s) within the areas targeted by this assay, and inadequate number of viral copies (<250 copies / mL). A negative result must be combined with clinical observations, patient history, and epidemiological information.  Fact  Sheet for Patients:   StrictlyIdeas.no  Fact Sheet for Healthcare Providers: BankingDealers.co.za  This test is not yet approved or  cleared by the [place] FDA and has been authorized for detection and/or diagnosis of SARS-CoV-2 by FDA under an Emergency Use Authorization (EUA).  This EUA will remain in effect (meaning this test can be used) for the duration of the COVID-19 declaration under Section 564(b)(1) of the Act, 21 U.S.C. section 360bbb-3(b)(1), unless the authorization is terminated or revoked sooner.  Performed at [facility], [address], [place], [place] [address]     Radiology Reports No results found.   CBC Recent Labs  Lab 03/30/20 1045 03/30/20 1618 03/31/20 0541 03/31/20 1105  WBC  --  3.0*  --  2.7*  HGB 5.2* 5.5* 6.4* 7.2*  HCT 18.6* 19.5* 21.6* 23.9*  PLT  --  49*  --  49*  MCV  --  92.9  --  91.6  MCH  --  26.2  --  27.6  MCHC  --  28.2*  --  30.1  RDW  --  19.9*  --  17.9*  LYMPHSABS  --  0.6*  --   --   MONOABS  --  0.4  --   --   EOSABS  --  0.1  --   --   BASOSABS  --  0.0  --   --     Chemistries  Recent Labs  Lab 03/30/20 1618  NA 133*  K 3.7  CL 104  CO2 24  GLUCOSE 117*  BUN 25*  CREATININE 1.03*  CALCIUM 7.6*    AST 34  ALT 28  ALKPHOS 74  BILITOT 2.2*   ------------------------------------------------------------------------------------------------------------------ No results for input(s): CHOL, HDL, LDLCALC, TRIG, CHOLHDL, LDLDIRECT in the last 72 hours.  Lab Results  Component Value Date   HGBA1C 4.9 09/30/2019   ------------------------------------------------------------------------------------------------------------------ No results for input(s): TSH, T4TOTAL, T3FREE, THYROIDAB in the last 72 hours.  Invalid input(s): FREET3 ------------------------------------------------------------------------------------------------------------------ No results for input(s): VITAMINB12, FOLATE, FERRITIN, TIBC, IRON, RETICCTPCT in the last 72 hours.  Coagulation profile No results for input(s): INR, PROTIME in the last 168 hours.  No results for input(s): DDIMER in the last 72 hours.  Cardiac Enzymes No results for input(s): CKMB, TROPONINI, MYOGLOBIN in the last 168 hours.  Invalid input(s): CK ------------------------------------------------------------------------------------------------------------------    Component Value Date/Time   BNP 40.0 09/29/2019 1718     Jeannine Pennisi M.D on 03/31/2020 at 4:20 PM  Go to [email or web address] - for contact info  [facility] - Office  [phone]

## 2020-03-31 NOTE — Consult Note (Signed)
GI Inpatient Consult Note  Reason for Consult: Anemia    Attending Requesting Consult: Dr. Denton Brick  History of Present Illness: Maria [name] is a 64 y.o. female seen for evaluation of anemia. Complex PMHx of decompensated cirrhosis due to NALFD (followed by [facility] transplant hepatology as well). She has hx of severe anemia with routine weekly or biweekly transfusions.  Last blood transfusion was 03/17/20. Routine Hgb monitoring this week showed Hgb of 5.2 and was recommended for admission.   She is seen this morning and reports few weeks ago noting dark stools that persisted about a week but have since resolved.  She is back to having loose brown stools with the lactulose.  Usually having 3-4 bowel movements a day.  Typically taking 60 mL 3-4 times a day depending on stools.  She denies any significant abdominal pain, GERD, dysphagia.  She does have significant early satiety, she is mainly doing liquids and feels that even liquids such as boost fill her up easy.  Having trouble with blood sugars due to lows from early satiety.  She denies any abdominal distention.  Tries to keep her legs elevated to decrease lower extremity edema.    Last Endoscopy: 01/02/20-- Normal esophagus.             - Small hiatal hernia with PHG present.             - Portal hypertensive gastropathy.             - Gastric antral vascular ectasia without bleeding.              Banded.             - Normal examined duodenum   Past Medical History:  Past Medical History:  Diagnosis Date  . Anemia   . CHF (congestive heart failure) ([place])   . Cirrhosis ([place])   . Depression   . Diabetes mellitus   . Esophageal bleed, non-variceal   . Eye hemorrhage    Behind left eye  . Fibromyalgia   . Hypercholesteremia   . Hypertension   . Hypothyroidism   . NAFLD (nonalcoholic fatty liver disease)   . Osteoarthrosis   . Osteoporosis   . PONV (postoperative  nausea and vomiting)   . UTI (urinary tract infection) 11/12 end of the month   Patient feels that she passed a kidney stone at that time    Problem List: Patient Active Problem List   Diagnosis Date Noted  . Acute anemia 09/29/2019  . Hepatic encephalopathy ([place]) 08/27/2019  . Altered mental status 02/27/2019  . Bacteriuria 02/27/2019  . Acute on chronic anemia 02/24/2019  . Acute hepatic encephalopathy 07/26/2018  . Obesity, Class III, BMI 40-49.9 (morbid obesity) ([place])   . Melena   . Anemia 07/02/2018  . UGI bleed 11/09/2017  . Insulin dependent diabetes mellitus   . Absolute anemia 09/13/2017  . Idiopathic esophageal varices with bleeding ([place]) 09/13/2017  . Other cirrhosis of liver ([place]) 08/22/2017  . NAFLD (nonalcoholic fatty liver disease) 08/14/2017  . Encephalopathy, hepatic ([place]) 08/14/2017  . Metabolic encephalopathy 26/33/3545  . Hyperammonemia ([place]) 07/18/2017  . Hypothyroidism 07/18/2017  . Acute on chronic right-sided congestive heart failure ([place]) 07/06/2017  . Pulmonary hypertension ([place]) 07/06/2017  . GAVE (gastric antral vascular ectasia) 07/06/2017  . GI bleeding 07/06/2017  . Symptomatic anemia 07/04/2017  . Hypokalemia 07/04/2017  . Chronic diastolic heart failure ([place]) 06/11/2017  . CAD (coronary artery disease) 06/11/2017  . Nocturnal hypoxemia  02/22/2017  . Idiopathic esophageal varices without bleeding ([place]) 01/31/2017  . Dyspnea and respiratory abnormalities 01/25/2017  . Esophageal varices in cirrhosis ([place]) 05/19/2016  . Hepatic cirrhosis ([place]) 05/19/2016  . Esophageal varices ([place]) 12/09/2012  . Dysphagia, unspecified(787.20) 12/09/2012  . Anxiety 08/26/2012  . Cirrhosis, nonalcoholic ([place]) 80/11/4915  . Diabetes mellitus ([place]) 10/09/2011  . History of esophageal varices 10/09/2011  . Iron deficiency anemia 10/09/2011  . GERD (gastroesophageal reflux disease) 10/09/2011  . Thrombocytopenia ([place]) 10/09/2011    Past Surgical History: Past  Surgical History:  Procedure Laterality Date  . APPENDECTOMY  1980  . BILATERAL SALPINGOOPHORECTOMY    . CHOLECYSTECTOMY    . COLONOSCOPY  03/15/2011  . COLONOSCOPY N/A 03/24/2016   Procedure: COLONOSCOPY;  Surgeon: [name], MD;  [facility];  Service: Endoscopy;  Laterality: N/A;  855  . ESOPHAGEAL BANDING  04/24/2012   Procedure: ESOPHAGEAL BANDING;  Surgeon: [name], MD;  [facility];  Service: Endoscopy;  Laterality: N/A;  . Esophageal BANDING  08/03/2012   [facility] in [place] , [place]  09/24/2012   Procedure: ESOPHAGEAL BANDING;  Surgeon: [name], MD;  Location: [facility];  Service: Endoscopy;  Laterality: N/A;  Banding x 3  . ESOPHAGEAL BANDING N/A 01/29/2013   Procedure: ESOPHAGEAL BANDING;  Surgeon: [name], MD;  [facility];  Service: Endoscopy;  Laterality: N/A;  . ESOPHAGEAL BANDING N/A 06/19/2014   Procedure: ESOPHAGEAL BANDING;  Surgeon: [name], MD;  [facility];  Service: Endoscopy;  Laterality: N/A;  . ESOPHAGEAL BANDING N/A 01/05/2016   Procedure: ESOPHAGEAL BANDING;  Surgeon: [name], MD;  [facility];  Service: Endoscopy;  Laterality: N/A;  . ESOPHAGEAL BANDING N/A 08/03/2016   Procedure: ESOPHAGEAL BANDING;  Surgeon: [name], MD;  [facility];  Service: Endoscopy;  Laterality: N/A;  . ESOPHAGEAL BANDING N/A 05/02/2017   Procedure: ESOPHAGEAL BANDING;  Surgeon: [name], MD;  [facility];  Service: Endoscopy;  Laterality: N/A;  . ESOPHAGOGASTRODUODENOSCOPY  04/24/2012   Procedure: ESOPHAGOGASTRODUODENOSCOPY (EGD);  Surgeon: [name], MD;  [facility];  Service: Endoscopy;  Laterality: N/A;  300  . ESOPHAGOGASTRODUODENOSCOPY N/A 01/29/2013   Procedure: ESOPHAGOGASTRODUODENOSCOPY (EGD);  Surgeon: [name], MD;  [facility];  Service: Endoscopy;  Laterality: N/A;  1200   . ESOPHAGOGASTRODUODENOSCOPY N/A 05/22/2013   Procedure: ESOPHAGOGASTRODUODENOSCOPY (EGD);  Surgeon: [name], MD;  [facility];  Service: Endoscopy;  Laterality: N/A;  1:55  . ESOPHAGOGASTRODUODENOSCOPY N/A 12/31/2013   Procedure: ESOPHAGOGASTRODUODENOSCOPY (EGD);  Surgeon: [name], MD;  [facility];  Service: Endoscopy;  Laterality: N/A;  1200  . ESOPHAGOGASTRODUODENOSCOPY N/A 06/19/2014   Procedure: ESOPHAGOGASTRODUODENOSCOPY (EGD);  Surgeon: [name], MD;  [facility];  Service: Endoscopy;  Laterality: N/A;  1055  . ESOPHAGOGASTRODUODENOSCOPY N/A 01/15/2015   Procedure: ESOPHAGOGASTRODUODENOSCOPY (EGD);  Surgeon: [name], MD;  [facility];  Service: Endoscopy;  Laterality: N/A;  730 - moved to 9:45 - moved to 1250-Ann notified pt  . ESOPHAGOGASTRODUODENOSCOPY N/A 06/30/2015   Procedure: ESOPHAGOGASTRODUODENOSCOPY (EGD);  Surgeon: [name], MD;  [facility];  Service: Endoscopy;  Laterality: N/A;  1200  . ESOPHAGOGASTRODUODENOSCOPY N/A 01/05/2016   Procedure: ESOPHAGOGASTRODUODENOSCOPY (EGD);  Surgeon: [name], MD;  [facility];  Service: Endoscopy;  Laterality: N/A;  830  . ESOPHAGOGASTRODUODENOSCOPY N/A 08/03/2016   Procedure: ESOPHAGOGASTRODUODENOSCOPY (EGD);  Surgeon: [name], MD;  [facility];  Service: Endoscopy;  Laterality: N/A;  930  . ESOPHAGOGASTRODUODENOSCOPY N/A 05/02/2017   Procedure: ESOPHAGOGASTRODUODENOSCOPY (EGD);  Surgeon: [name], MD;  [facility];  Service: Endoscopy;  Laterality: N/A;  12:00  . ESOPHAGOGASTRODUODENOSCOPY N/A 08/17/2017   Procedure: ESOPHAGOGASTRODUODENOSCOPY (EGD);  Surgeon: [name], MD;  [facility];  Service: Endoscopy;  Laterality: N/A;  8:15  . ESOPHAGOGASTRODUODENOSCOPY N/A 09/12/2017   Procedure: ESOPHAGOGASTRODUODENOSCOPY (EGD);  Surgeon: [name], MD;  [facility];  Service: Endoscopy;  Laterality: N/A;  1040  . ESOPHAGOGASTRODUODENOSCOPY N/A 10/10/2017   Procedure: ESOPHAGOGASTRODUODENOSCOPY (EGD);  Surgeon: [name], MD;  [facility];  Service: Endoscopy;  Laterality: N/A;  225  . ESOPHAGOGASTRODUODENOSCOPY N/A 11/09/2017   Procedure: ESOPHAGOGASTRODUODENOSCOPY (EGD);  Surgeon: [name], MD;  [facility];  Service: Endoscopy;  Laterality: N/A;  . ESOPHAGOGASTRODUODENOSCOPY (EGD) WITH PROPOFOL  09/24/2012   Procedure: ESOPHAGOGASTRODUODENOSCOPY (EGD) WITH PROPOFOL;  Surgeon: [name], MD;  [facility];  Service: Endoscopy;  Laterality: N/A;  GE junction at 36  . ESOPHAGOGASTRODUODENOSCOPY (EGD) WITH PROPOFOL N/A 07/06/2017   Procedure: ESOPHAGOGASTRODUODENOSCOPY (EGD) WITH PROPOFOL;  Surgeon: [name], MD;  [facility];  Service: Endoscopy;  Laterality: N/A;  . ESOPHAGOGASTRODUODENOSCOPY W/ BANDING  08/2010  . GIVENS CAPSULE STUDY N/A 07/05/2017   Procedure: GIVENS CAPSULE STUDY;  Surgeon: [name], MD;  [facility];  Service: Endoscopy;  Laterality: N/A;  . HOT HEMOSTASIS N/A 08/17/2017   Procedure: HOT HEMOSTASIS (ARGON PLASMA COAGULATION/BICAP);  Surgeon: [name], MD;  [facility];  Service: Endoscopy;  Laterality: N/A;  . HOT HEMOSTASIS  09/12/2017   Procedure: HOT HEMOSTASIS (ARGON PLASMA COAGULATION/BICAP);  Surgeon: [name], MD;  [facility];  Service: Endoscopy;;  gastric  . HOT HEMOSTASIS  10/10/2017   Procedure: HOT HEMOSTASIS (ARGON PLASMA COAGULATION/BICAP);  Surgeon: [name], MD;  [facility];  Service: Endoscopy;;  . POLYPECTOMY  03/24/2016   Procedure: POLYPECTOMY;  Surgeon: [name], MD;  [facility];  Service: Endoscopy;;  sigmoid polyp  . PORTACATH PLACEMENT Right 06/02/2019   Procedure: INSERTION PORT-A-CATH;  Surgeon: Aviva Signs, MD;  Location: [facility];  Service:  General;  Laterality: Right;  . RIGHT HEART CATH N/A 04/16/2017   Procedure: Right Heart Cath;  Surgeon: [name], MD;  Location: [place] [facility];  Service: Cardiovascular;  Laterality: N/A;  . RIGHT HEART CATH N/A 07/12/2017   Procedure: RIGHT HEART CATH;  Surgeon: [name], MD;  Location: [place] [facility];  Service: Cardiovascular;  Laterality: N/A;  . TONSILLECTOMY    . UPPER GASTROINTESTINAL ENDOSCOPY  03/15/2011   EGD ED BANDING/TCS  . UPPER GASTROINTESTINAL ENDOSCOPY  09/05/2010  . UPPER GASTROINTESTINAL ENDOSCOPY  08/11/2010  . VAGINAL HYSTERECTOMY      Allergies: Allergies  Allergen Reactions  . Ferumoxytol Other (See Comments)    Patient has severe back and chest pain when receiving FERAHEME infusions.   . Tramadol Hcl Other (See Comments)    WEAKNESS     Home Medications: Medications Prior to Admission  Medication Sig Dispense Refill Last Dose  . bumetanide (BUMEX) 2 MG tablet Take 2 mg by mouth 2 (two) times daily.    03/30/2020  at Unknown time  . busPIRone (BUSPAR) 10 MG tablet Take 10 mg by mouth 2 (two) times daily.   03/30/2020 at Unknown time  . Cholecalciferol 25 MCG (1000 UT) tablet Take 1,000 Units by mouth every other day.    03/29/2020 at Unknown time  . HUMALOG 100 UNIT/ML injection Inject 15-35 Units into the skin 3 (three) times daily with meals. Sliding scale insulin 100-150= 15 units 150-200= 20 units 250-300= 30 units 300-350= 35 units  6 03/30/2020 at Unknown time  . Insulin Glargine (LANTUS SOLOSTAR) 100 UNIT/ML Solostar Pen Inject 10-60 Units into the skin See admin instructions. Inject 55 units subcutaneously in the morning & 10 units subcutaneously  if needed, in addition   03/30/2020 at Unknown time  . lactulose (CHRONULAC) 10 GM/15ML solution Take 60 mLs by mouth in the morning, at noon, in the evening, and at bedtime. *If having watery stools more than 3 times daily, reduce to taking 3 times daily   03/30/2020 at Unknown time  .  levothyroxine (SYNTHROID, LEVOTHROID) 25 MCG tablet Take 25 mcg by mouth daily before breakfast.    03/30/2020 at Unknown time  . lidocaine-prilocaine (EMLA) cream Apply 1 application topically daily as needed (applied to port).    03/30/2020 at Unknown time  . magnesium oxide (MAG-OX) 400 MG tablet Take 400 mg by mouth at bedtime.    03/29/2020 at Unknown time  . pantoprazole (PROTONIX) 40 MG tablet Take 1 tablet (40 mg total) by mouth daily before breakfast. 60 tablet 5 03/30/2020 at Unknown time  . Pediatric Multivitamins-Iron (FLINTSTONES PLUS IRON PO) Take 2 tablets by mouth daily.   03/30/2020 at Unknown time  . potassium chloride SA (KLOR-CON) 20 MEQ tablet Take 2 tablets (40 mEq total) by mouth daily. (Patient taking differently: Take 20 mEq by mouth 2 (two) times daily. ) 60 tablet 2 03/30/2020 at Unknown time  . rifaximin (XIFAXAN) 550 MG TABS tablet Take 1 tablet (550 mg total) by mouth 2 (two) times daily.   03/30/2020 at Unknown time  . rosuvastatin (CRESTOR) 10 MG tablet Take 10 mg by mouth daily.   03/30/2020 at Unknown time  . sertraline (ZOLOFT) 50 MG tablet Take 50 mg by mouth daily.    03/30/2020 at Unknown time  . spironolactone (ALDACTONE) 100 MG tablet Take 1 tablet (100 mg total) by mouth daily. 30 tablet 3 03/30/2020 at Unknown time  . trimethoprim-polymyxin b (POLYTRIM) ophthalmic solution Place 1 drop into the left eye See admin instructions. Use 3 to 4 times daily for 2 days following monthly eye injection  12 unknown  . zinc sulfate 220 (50 Zn) MG capsule Take 1 capsule (220 mg total) by mouth daily. 30 capsule 1 03/30/2020 at Unknown time   Home medication reconciliation was completed with the patient.   Scheduled Inpatient Medications:   . bumetanide  2 mg Oral BID  . busPIRone  10 mg Oral BID  . insulin aspart  0-15 Units Subcutaneous Q6H  . lactulose  40 g Oral TID  . levothyroxine  25 mcg Oral QAC breakfast  . magnesium oxide  400 mg Oral QHS  . pantoprazole (PROTONIX) IV   40 mg Intravenous Q24H  . potassium chloride SA  20 mEq Oral BID  . rifaximin  550 mg Oral BID  . rosuvastatin  10 mg Oral Daily  . sertraline  50 mg Oral Daily  . spironolactone  100 mg Oral Daily    Continuous Inpatient Infusions:    PRN Inpatient  Medications:  acetaminophen **OR** acetaminophen, ondansetron **OR** ondansetron (ZOFRAN) IV  Family History: family history includes Colon cancer in her brother; Diabetes in her father, sister, and sister; Healthy in her daughter; Hypertension in her daughter and sister; Hypothyroidism in her brother and sister; Lung cancer in her mother; Obesity in her daughter.    Social History:   reports that she has never smoked. She has never used smokeless tobacco. She reports that she does not drink alcohol and does not use drugs.   Review of Systems: Constitutional: Weight is stable.  Eyes: No changes in vision. ENT: No oral lesions, sore throat.  GI: see HPI.  Heme/Lymph: No easy bruising.  CV: No chest pain.  GU: No hematuria.  Integumentary: No rashes.  Neuro: No headaches.  Psych: No depression/anxiety.  Endocrine: No heat/cold intolerance.  Allergic/Immunologic: No urticaria.  Resp: No cough, SOB.  Musculoskeletal: No joint swelling.    Physical Examination: BP (!) 119/43 (BP Location: Right Arm)   Pulse 73   Temp 98.5 F (36.9 C)   Resp 18   Ht 5' 3"  (1.6 m)   Wt 86.1 kg   SpO2 97%   BMI 33.62 kg/m  Gen: NAD, alert and oriented x 4 HEENT: PEERLA, EOMI, Neck: supple, no JVD or thyromegaly Chest: CTA bilaterally, no wheezes, crackles, or other adventitious sounds CV: RRR, no m/g/c/r Abd: soft, NT, ND, +BS in all four quadrants; no HSM, guarding, ridigity, or rebound tenderness Ext: no edema, well perfused with 2+ pulses, Skin: no rash or lesions noted Lymph: no LAD  Data: Lab Results  Component Value Date   WBC 3.0 (L) 03/30/2020   HGB 6.4 (LL) 03/31/2020   HCT 21.6 (L) 03/31/2020   MCV 92.9 03/30/2020   PLT  49 (L) 03/30/2020   Recent Labs  Lab 03/30/20 1045 03/30/20 1618 03/31/20 0541  HGB 5.2* 5.5* 6.4*   Lab Results  Component Value Date   NA 133 (L) 03/30/2020   K 3.7 03/30/2020   CL 104 03/30/2020   CO2 24 03/30/2020   BUN 25 (H) 03/30/2020   CREATININE 1.03 (H) 03/30/2020   Lab Results  Component Value Date   ALT 28 03/30/2020   AST 34 03/30/2020   ALKPHOS 74 03/30/2020   BILITOT 2.2 (H) 03/30/2020   No results for input(s): APTT, INR, PTT in the last 168 hours.  Ammonia 65 (9-35) Hgb on 03/23/20 was 7.8    02/2020-AFP normal     Assessment/Plan: Maria [name] is a 64 y.o. female seen for evaluation, well-known patient to Dr. Laural Golden.  She has received transfusion and Hgb improved, no GI interventions planned during this admission.  We will transition her from n.p.o. to low-salt diabetic diet.  Dr. Laural Golden to notify [facility] transplant hepatology of current admission.  Weight has been stable without evidence of edema. Ascites, etc and will continue her home diuretic regimen. Stable for d/c from GI standpoint when able. Has repeat EGD planned w/ Dr Marene Lenz at [facility] next week.   Case was discussed with Dr. Laural Golden. Thank you for the consult. Please call with questions or concerns.  Laurine Blazer, PA-C [name]

## 2020-03-31 NOTE — Care Management CC44 (Signed)
Condition Code 44 Documentation Completed  Patient Details  Name: Maria [name] MRN: [id number] Date of Birth: [birth year]   Condition Code 44 given:    Patient signature on Condition Code 44 notice:    Documentation of 2 MD's agreement:    Code 44 added to claim:       Laurena Slimmer, RN 03/31/2020, 9:28 PM

## 2020-03-31 NOTE — Progress Notes (Signed)
Around 7:50 PM I was informed that patient and patient's husband are requesting discharge home after completion of additional unit of packed red blood cells  -Patient was admitted to inpatient status earlier in the day  -Patient states that she will follow up as outpatient with GI - Patient has frequent GI bleed and frequent transfusions -She is tired and she just wants to go home  -- Patient and husband insist on leaving tonight after completion of transfusion of PRBC  --I have agreed to discharge patient with outpatient follow-up plans with GI after completing her transfusion this evening  Roxan Hockey, MD

## 2020-04-01 LAB — BPAM RBC
Blood Product Expiration Date: 202108102359
Blood Product Expiration Date: 202108162359
Blood Product Expiration Date: 202108162359
ISSUE DATE / TIME: 202107131728
ISSUE DATE / TIME: 202107132251
ISSUE DATE / TIME: 202107141641
Unit Type and Rh: 9500
Unit Type and Rh: 9500
Unit Type and Rh: 9500

## 2020-04-01 LAB — TYPE AND SCREEN
ABO/RH(D): O NEG
Antibody Screen: NEGATIVE
Unit division: 0
Unit division: 0
Unit division: 0

## 2020-04-05 NOTE — Unmapped (Signed)
Call placed and VM left on Marilyn French's cell # ( unable to leave VM on home or spouse's cell #). Requested she / he return my call when able to discus recent Liver Transplant Selection outcome.

## 2020-04-06 ENCOUNTER — Other Ambulatory Visit: Payer: Self-pay

## 2020-04-06 ENCOUNTER — Encounter (HOSPITAL_COMMUNITY)
Admission: RE | Admit: 2020-04-06 | Discharge: 2020-04-06 | Disposition: A | Discharge status: Home / Self Care | Payer: PPO | Source: Ambulatory Visit | Attending: Internal Medicine | Admitting: Internal Medicine

## 2020-04-06 DIAGNOSIS — K7581 Nonalcoholic steatohepatitis (NASH): Secondary | ICD-10-CM | POA: Diagnosis not present

## 2020-04-06 LAB — SAMPLE TO BLOOD BANK

## 2020-04-06 LAB — HEMOGLOBIN AND HEMATOCRIT, BLOOD
HCT: 24 % — ABNORMAL LOW (ref 36.0–46.0)
Hemoglobin: 7 g/dL — ABNORMAL LOW (ref 12.0–15.0)

## 2020-04-06 LAB — PREPARE RBC (CROSSMATCH)

## 2020-04-06 MED ORDER — HEPARIN SOD (PORK) LOCK FLUSH 100 UNIT/ML IV SOLN
500.0000 [IU] | Freq: Once | INTRAVENOUS | Status: AC
  Administered 2020-04-06: 500 [IU] via INTRAVENOUS
  Filled 2020-04-06: qty 5

## 2020-04-06 NOTE — Unmapped (Signed)
Call placed and spoke with Marilyn French and her spouse Casimiro Needle - made them aware of Selection outcome and explained in great detail internal approval - spouse states clear understanding that patient in NOT actively listed d/t low MELD and that activation could be completely quickly if score warranted.       Patient received 3 units PRBC last week and scheduled for additional unit this week (locally0. EGD scheduled to take place @ [facility] later this week.

## 2020-04-07 ENCOUNTER — Encounter (HOSPITAL_COMMUNITY)
Admission: RE | Admit: 2020-04-07 | Discharge: 2020-04-07 | Disposition: A | Discharge status: Home / Self Care | Payer: PPO | Source: Ambulatory Visit | Attending: Internal Medicine | Admitting: Internal Medicine

## 2020-04-07 ENCOUNTER — Encounter (HOSPITAL_COMMUNITY): Payer: Self-pay

## 2020-04-07 DIAGNOSIS — K7581 Nonalcoholic steatohepatitis (NASH): Secondary | ICD-10-CM | POA: Diagnosis not present

## 2020-04-07 MED ORDER — ACETAMINOPHEN 325 MG PO TABS
650.0000 mg | ORAL_TABLET | Freq: Once | ORAL | Status: AC
  Administered 2020-04-07: 650 mg via ORAL

## 2020-04-07 MED ORDER — DIPHENHYDRAMINE HCL 25 MG PO CAPS
50.0000 mg | ORAL_CAPSULE | Freq: Once | ORAL | Status: AC
  Administered 2020-04-07: 50 mg via ORAL

## 2020-04-07 MED ORDER — SODIUM CHLORIDE 0.9% IV SOLUTION: Freq: Once | INTRAVENOUS | Status: AC

## 2020-04-08 LAB — TYPE AND SCREEN
ABO/RH(D): O NEG
Antibody Screen: NEGATIVE
Unit division: 0

## 2020-04-08 LAB — BPAM RBC
Blood Product Expiration Date: 202108102359
ISSUE DATE / TIME: 202107210928
Unit Type and Rh: 9500

## 2020-04-09 ENCOUNTER — Encounter: Admit: 2020-04-09 | Discharge: 2020-04-10 | Payer: PRIVATE HEALTH INSURANCE

## 2020-04-09 ENCOUNTER — Ambulatory Visit: Admit: 2020-04-09 | Discharge: 2020-04-10 | Payer: PRIVATE HEALTH INSURANCE

## 2020-04-09 DIAGNOSIS — K7290 Hepatic failure, unspecified without coma: Secondary | ICD-10-CM | POA: Diagnosis not present

## 2020-04-09 DIAGNOSIS — K3189 Other diseases of stomach and duodenum: Secondary | ICD-10-CM | POA: Diagnosis not present

## 2020-04-09 DIAGNOSIS — E7800 Pure hypercholesterolemia, unspecified: Secondary | ICD-10-CM | POA: Diagnosis not present

## 2020-04-09 DIAGNOSIS — K7460 Unspecified cirrhosis of liver: Secondary | ICD-10-CM | POA: Diagnosis not present

## 2020-04-09 DIAGNOSIS — Z9884 Bariatric surgery status: Secondary | ICD-10-CM | POA: Diagnosis not present

## 2020-04-09 DIAGNOSIS — Z794 Long term (current) use of insulin: Secondary | ICD-10-CM | POA: Diagnosis not present

## 2020-04-09 DIAGNOSIS — F418 Other specified anxiety disorders: Secondary | ICD-10-CM | POA: Diagnosis not present

## 2020-04-09 DIAGNOSIS — E039 Hypothyroidism, unspecified: Secondary | ICD-10-CM | POA: Diagnosis not present

## 2020-04-09 DIAGNOSIS — M810 Age-related osteoporosis without current pathological fracture: Secondary | ICD-10-CM | POA: Diagnosis not present

## 2020-04-09 DIAGNOSIS — K31819 Angiodysplasia of stomach and duodenum without bleeding: Secondary | ICD-10-CM | POA: Diagnosis not present

## 2020-04-09 DIAGNOSIS — I10 Essential (primary) hypertension: Secondary | ICD-10-CM | POA: Diagnosis not present

## 2020-04-09 DIAGNOSIS — D500 Iron deficiency anemia secondary to blood loss (chronic): Secondary | ICD-10-CM | POA: Diagnosis not present

## 2020-04-09 DIAGNOSIS — K31811 Angiodysplasia of stomach and duodenum with bleeding: Secondary | ICD-10-CM | POA: Diagnosis not present

## 2020-04-09 DIAGNOSIS — F419 Anxiety disorder, unspecified: Secondary | ICD-10-CM | POA: Diagnosis not present

## 2020-04-09 DIAGNOSIS — Z888 Allergy status to other drugs, medicaments and biological substances status: Secondary | ICD-10-CM | POA: Diagnosis not present

## 2020-04-09 DIAGNOSIS — E119 Type 2 diabetes mellitus without complications: Secondary | ICD-10-CM | POA: Diagnosis not present

## 2020-04-09 DIAGNOSIS — I8500 Esophageal varices without bleeding: Secondary | ICD-10-CM | POA: Diagnosis not present

## 2020-04-09 DIAGNOSIS — K760 Fatty (change of) liver, not elsewhere classified: Secondary | ICD-10-CM | POA: Diagnosis not present

## 2020-04-09 DIAGNOSIS — K449 Diaphragmatic hernia without obstruction or gangrene: Secondary | ICD-10-CM | POA: Diagnosis not present

## 2020-04-09 DIAGNOSIS — K766 Portal hypertension: Secondary | ICD-10-CM | POA: Diagnosis not present

## 2020-04-09 DIAGNOSIS — I81 Portal vein thrombosis: Secondary | ICD-10-CM | POA: Diagnosis not present

## 2020-04-09 DIAGNOSIS — Z79899 Other long term (current) drug therapy: Secondary | ICD-10-CM | POA: Diagnosis not present

## 2020-04-09 DIAGNOSIS — F329 Major depressive disorder, single episode, unspecified: Secondary | ICD-10-CM | POA: Diagnosis not present

## 2020-04-09 DIAGNOSIS — Z79890 Hormone replacement therapy (postmenopausal): Secondary | ICD-10-CM | POA: Diagnosis not present

## 2020-04-09 DIAGNOSIS — Z9049 Acquired absence of other specified parts of digestive tract: Secondary | ICD-10-CM | POA: Diagnosis not present

## 2020-04-09 DIAGNOSIS — E785 Hyperlipidemia, unspecified: Secondary | ICD-10-CM | POA: Diagnosis not present

## 2020-04-09 DIAGNOSIS — I8510 Secondary esophageal varices without bleeding: Secondary | ICD-10-CM | POA: Diagnosis not present

## 2020-04-09 LAB — CBC
HEMATOCRIT: 21.6 % — ABNORMAL LOW (ref 36.0–46.0)
HEMOGLOBIN: 6.7 g/dL — ABNORMAL LOW (ref 12.0–16.0)
MEAN CORPUSCULAR HEMOGLOBIN CONC: 31.2 g/dL (ref 31.0–37.0)
MEAN CORPUSCULAR HEMOGLOBIN: 28 pg (ref 26.0–34.0)
MEAN CORPUSCULAR VOLUME: 89.8 fL (ref 80.0–100.0)
MEAN PLATELET VOLUME: 14.1 fL — ABNORMAL HIGH (ref 7.0–10.0)
PLATELET COUNT: 43 10*9/L — ABNORMAL LOW (ref 150–440)
RED CELL DISTRIBUTION WIDTH: 18 % — ABNORMAL HIGH (ref 12.0–15.0)
WBC ADJUSTED: 2.6 10*9/L — ABNORMAL LOW (ref 4.5–11.0)

## 2020-04-09 LAB — RED CELL DISTRIBUTION WIDTH: Lab: 18 — ABNORMAL HIGH

## 2020-04-09 MED ADMIN — vasopressin (VASOSTRICT) injection: INTRAVENOUS | @ 12:00:00 | Stop: 2020-04-09

## 2020-04-09 MED ADMIN — vasopressin (VASOSTRICT) injection: INTRAVENOUS | @ 13:00:00 | Stop: 2020-04-09

## 2020-04-09 MED ADMIN — propofol (DIPRIVAN) infusion 10 mg/mL: INTRAVENOUS | @ 12:00:00 | Stop: 2020-04-09

## 2020-04-09 MED ADMIN — phenylephrine HCl in 0.9% NaCl 0.8 mg/10 mL (80 mcg/mL) injection Syrg: INTRAVENOUS | @ 12:00:00 | Stop: 2020-04-09

## 2020-04-09 MED ADMIN — lactated Ringers infusion: 10 mL/h | INTRAVENOUS | @ 12:00:00

## 2020-04-09 MED ADMIN — ondansetron (ZOFRAN) injection 4 mg: 4 mg | INTRAVENOUS | @ 15:00:00 | Stop: 2020-04-09

## 2020-04-09 MED ADMIN — propofoL (DIPRIVAN) injection: INTRAVENOUS | @ 12:00:00 | Stop: 2020-04-09

## 2020-04-09 NOTE — Unmapped (Cosign Needed)
Internal Medicine (MEDU) History & Physical    Assessment & Plan:   Marilyn French is a 64 y.o. female with PMHx of HTN, HLD, T2DM and cirrhosis in the setting of NAFLD and GAVE c/b transfusions and HE that presented to [facility] for observation post GI banding procedure.     Principal Problem:    Chronic blood loss anemia  Active Problems:    NAFLD (nonalcoholic fatty liver disease)    Cirrhosis (CMS-HCC)    GAVE (gastric antral vascular ectasia)  Resolved Problems:    * No resolved hospital problems. *    Acute vs. Chronic Blood Loss Anemia s/p variceal banding  She was admitted for GI bleed at OSH on 03/17/20 receiving 3 units of pRBCs. Presented to [facility] today primarily for banding procedure in the setting of GI bleed requiring multiple transfusions. Hgb of 6.7 prior to procedure. Plan to monitor H&H  - Post tranfusion H/H, daily CBC  - Transfuse if hgb < 7, active type and screen    Cirrhosis 2/2 NAFLD - GAVE  Presented for treatment of variceal bleeding 2/2 history of cirrhosis. Procedure completed today (7/23). Will continue home medications and monitor as appropriate.   - Continue lactulose 20g QID and rifaximin 550mg  BID    T2DM   Managed at home with Lantus 45U nightly, insulin lispro 15-30U with meals, insulin aspart SSI. Holding home meds and managing with SSI given unclear PO intake.  - SSI    HLD: continue atorvastatin 10 mg every day  Anxiety/Depression: continue buspirone 10 mg BID and zoloft 50 mg daily  Hypothyroid: continue levothyroxine 15 mcg daily    Daily Checklist:  Diet: Sodium Restricted (2g)  DVT PPx: Contraindicated - High Risk for Bleeding/Active Bleeding  Electrolytes: No Repletion Needed  Code Status: Full Code    Chief Concern:   Chronic blood loss anemia    Subjective:   HPI:  Marilyn French is a 64 y.o. female with PMHx of HTN, HLD, T2DM and cirrhosis in the setting of NAFLD and GAVE c/b transfusions and HE who presents post GI variceal banding procedure for observation to monitor hgb levels. She has been more tired recently, but in line with typical activity level. No recent melena, hematochezia, or coffee ground emesis.    Designated Healthcare Decision Maker:  Ms. Skarda currently has decisional capacity for healthcare decision-making and is able to designate a surrogate healthcare decision maker.    Allergies:  Ferumoxytol, Definity [perflutren lipid microspheres], and Tramadol hcl    Medications:   Prior to Admission medications    Medication Dose, Route, Frequency   busPIRone (BUSPAR) 10 MG tablet 10 mg, Oral, 2 times a day   insulin glargine (LANTUS SOLOSTAR U-100 INSULIN) 100 unit/mL (3 mL) injection pen 45 Units, Subcutaneous, Daily (standard)   lactulose (CHRONULAC) 10 gram/15 mL solution 20 g, Oral, 4 times a day   levothyroxine (SYNTHROID, LEVOTHROID) 25 MCG tablet 25 mcg, Oral, Daily (standard)   MAGNESIUM ORAL Oral   multivit with iron,minerals (FLINTSTONES COMPLETE, IRON, ORAL) Oral   potassium chloride SA (K-DUR,KLOR-CON) 20 MEQ tablet 20 mEq, Oral, Daily (standard)   rifAXIMin (XIFAXAN) 550 mg Tab 550 mg, Oral, 2 times a day (standard)   rosuvastatin (CRESTOR) 5 MG tablet 5 mg, Oral, Every other day   sertraline (ZOLOFT) 50 MG tablet 50 mg, Oral, Daily (standard), 1 tablet (50mg ) by mouth daily.   spironolactone (ALDACTONE) 100 MG tablet 100 mg, Oral, Daily (standard)   bumetanide (BUMEX) 2 MG tablet 2  mg, Oral, Daily (standard)   cholecalciferol, vitamin D3, (CHOLECALCIFEROL) 1,000 unit tablet 1,000 Units, Oral, Every other day   insulin ASPART (NOVOLOG FLEXPEN U-100 INSULIN) 100 unit/mL injection pen Use with given sliding scale    CBG 70-120: 0 units  CBG 121-150: 1 unit  CBG 151-200: 2 units  CBG 201-250: 3 units  CBG 251-300: 5 units  CBG 301-350: 7 units  CBG greater than 351: 9 units  Patient not taking: Reported on 01/02/2020   insulin lispro (HUMALOG) 100 unit/mL injection INJECT 15 TO 35 UNITS SUBCUTANEUOSLY PER SLIDING SCALE 3 TIMES DAILY    lidocaine-prilocaine (EMLA) 2.5-2.5 % cream No dose, route, or frequency recorded.   OXYGEN-AIR DELIVERY SYSTEMS MISC 2 L, Inhalation, Nightly   pediatric multivit-iron-min (FLINTSTONES COMPLETE) tablet 2 tablets, Oral, Daily (standard)   pen needle, diabetic 31 gauge x 1/4 Ndle No dose, route, or frequency recorded.   ZINC ACETATE ORAL Oral       Medical History:  Past Medical History:   Diagnosis Date   ??? Cirrhosis (CMS-HCC)    ??? Depression    ??? Diabetes mellitus (CMS-HCC)    ??? Fibromyalgia    ??? GAVE (gastric antral vascular ectasia)    ??? History of transfusion 04/2019    reports frequent blood tranfusions   ??? HTN (hypertension)    ??? Hypercholesterolemia    ??? Hypothyroid    ??? NAFLD (nonalcoholic fatty liver disease)    ??? Osteoporosis        Surgical History:  Past Surgical History:   Procedure Laterality Date   ??? APPENDECTOMY     ??? CHOLECYSTECTOMY     ??? IR TIPS  07/08/2018    IR TIPS 07/08/2018 Soledad Gerlach, MD IMG VIR H&V [facility]   ??? PR COLSC FLEXIBLE W/CONTROL BLEEDING ANY METHOD N/A 10/17/2019    Procedure: COLONOSCOPY, FLEXIBLE, PROXIMAL TO SPLENIC FLEXURE; DX, W/CONTROL OF BLEEDING;  Surgeon: Jules Husbands, MD;  [facility];  Service: Gastroenterology   ??? PR RIGHT HEART CATH O2 SATURATION & CARDIAC OUTPUT Right 01/18/2018    Procedure: Right Heart Catheterization;  Surgeon: Marlaine Hind, MD;  Location: [facility] CATH;  Service: Cardiology   ??? PR UPPER GI ENDOSCOPY,CTRL BLEED N/A 03/05/2019    Procedure: UGI ENDOSCOPY; WITH CONTROL OF BLEEDING, ANY METHOD;  Surgeon: Andrey Farmer, MD;  Location: [facility];  Service: Gastroenterology   ??? PR UPPER GI ENDOSCOPY,CTRL BLEED  04/04/2019    Procedure: UGI ENDOSCOPY; WITH CONTROL OF BLEEDING, ANY METHOD;  Surgeon: Janyth Pupa, MD;  Location: [facility];  Service: Gastroenterology   ??? PR UPPER GI ENDOSCOPY,CTRL BLEED N/A 05/23/2019    Procedure: UGI ENDOSCOPY; WITH CONTROL OF BLEEDING, ANY METHOD; Surgeon: Janyth Pupa, MD;  Location: [facility];  Service: Gastroenterology   ??? PR UPPER GI ENDOSCOPY,CTRL BLEED N/A 06/13/2019    Procedure: UGI ENDOSCOPY; WITH CONTROL OF BLEEDING, ANY METHOD;  Surgeon: Annie Paras, MD;  Location: [facility];  Service: Gastroenterology   ??? PR UPPER GI ENDOSCOPY,CTRL BLEED N/A 07/11/2019    Procedure: UGI ENDOSCOPY; WITH CONTROL OF BLEEDING, ANY METHOD;  Surgeon: Annie Paras, MD;  Location: [facility];  Service: Gastroenterology   ??? PR UPPER GI ENDOSCOPY,CTRL BLEED N/A 09/16/2019    Procedure: UGI ENDOSCOPY; WITH CONTROL OF BLEEDING, ANY METHOD;  Surgeon: Beverly Milch, MD;  Location: [facility];  Service: Gastrointestinal   ???  PR UPPER GI ENDOSCOPY,CTRL BLEED N/A 10/07/2019    Procedure: UGI ENDOSCOPY; WITH CONTROL OF BLEEDING, ANY METHOD;  Surgeon: Pia Mau, MD;  Location: [facility];  Service: Gastroenterology   ??? PR UPPER GI ENDOSCOPY,CTRL BLEED N/A 01/02/2020    Procedure: UGI ENDOSCOPY; WITH CONTROL OF BLEEDING, ANY METHOD;  Surgeon: Janyth Pupa, MD;  Location: [facility];  Service: Gastroenterology   ??? PR UPPER GI ENDOSCOPY,DIAGNOSIS N/A 03/03/2019    Procedure: UGI ENDO, INCLUDE ESOPHAGUS, STOMACH, & DUODENUM &/OR JEJUNUM; DX W/WO COLLECTION SPECIMN, BY BRUSH OR WASH;  Surgeon: Luanne Bras, MD;  Location: [facility];  Service: Gastroenterology   ??? PR UPPER GI ENDOSCOPY,LIGAT VARIX N/A 01/02/2020    Procedure: UGI ENDO; Everlene Balls LIG ESOPH &/OR GASTRIC VARICES;  Surgeon: Janyth Pupa, MD;  Location: [facility];  Service: Gastroenterology   ??? SALPINGOOPHORECTOMY         Family History:   History reviewed. No pertinent family history.    Social History:  The patient lives with family    Social History     Tobacco Use   ??? Smoking status: Never Smoker   ??? Smokeless tobacco: Never Used Vaping Use   ??? Vaping Use: Never used   Substance Use Topics   ??? Alcohol use: No   ??? Drug use: No        Review of Systems:  10 systems were reviewed and are negative unless otherwise mentioned in the HPI    Objective:   Physical Exam:  Temp:  [35.7 ??C-37.2 ??C] 37 ??C  Heart Rate:  [71-100] 73  SpO2 Pulse:  [74-87] 78  Resp:  [12-25] 21  BP: (86-141)/(30-53) 131/34  SpO2:  [91 %-100 %] 94 %    Gen: Chronically ill woman in NAD, answers questions appropriately  Eyes: Sclera icteric, EOMI  HENT: atraumatic, normocephalic, MMM.  Heart: RRR, S1, S2, no M/R/G  Lungs: CTAB, no crackles or wheezes, no use of accessory muscles  Abdomen:  soft, full, no rebound/guarding  Extremities: 2+ edema, no clubbing, cyanosis  Neuro: CN II-XI grossly intact, lying in bed post-procedure  Psych: Alert, oriented, normal mood and affect.     Labs/Studies/Imaging:  Labs, Studies, Imaging from the last 24hrs per EMR and personally reviewed    I have verified all student documentation or findings.  I have personally performed or re-performed the physical exam and medical decision making.  Wendee Beavers. Cristopher Peru, MD [facility]  Internal Medicine & Pediatrics, PGY5  [facility]  Pager [phone]

## 2020-04-10 DIAGNOSIS — K31819 Angiodysplasia of stomach and duodenum without bleeding: Secondary | ICD-10-CM | POA: Diagnosis not present

## 2020-04-10 DIAGNOSIS — D500 Iron deficiency anemia secondary to blood loss (chronic): Secondary | ICD-10-CM | POA: Diagnosis not present

## 2020-04-10 DIAGNOSIS — E119 Type 2 diabetes mellitus without complications: Secondary | ICD-10-CM | POA: Diagnosis not present

## 2020-04-10 DIAGNOSIS — Z794 Long term (current) use of insulin: Secondary | ICD-10-CM | POA: Diagnosis not present

## 2020-04-10 DIAGNOSIS — K760 Fatty (change of) liver, not elsewhere classified: Secondary | ICD-10-CM | POA: Diagnosis not present

## 2020-04-10 LAB — CBC
HEMATOCRIT: 26.1 % — ABNORMAL LOW (ref 36.0–46.0)
HEMOGLOBIN: 8.3 g/dL — ABNORMAL LOW (ref 12.0–16.0)
MEAN CORPUSCULAR HEMOGLOBIN CONC: 32 g/dL (ref 31.0–37.0)
MEAN CORPUSCULAR HEMOGLOBIN: 28 pg (ref 26.0–34.0)
MEAN CORPUSCULAR VOLUME: 87.5 fL (ref 80.0–100.0)
MEAN PLATELET VOLUME: 13.4 fL — ABNORMAL HIGH (ref 7.0–10.0)
PLATELET COUNT: 56 10*9/L — ABNORMAL LOW (ref 150–440)
RED BLOOD CELL COUNT: 2.98 10*12/L — ABNORMAL LOW (ref 4.00–5.20)
RED CELL DISTRIBUTION WIDTH: 18 % — ABNORMAL HIGH (ref 12.0–15.0)
WBC ADJUSTED: 4.3 10*9/L — ABNORMAL LOW (ref 4.5–11.0)

## 2020-04-10 LAB — WBC ADJUSTED: Leukocytes:NCnc:Pt:Bld:Qn:: 4.3 — ABNORMAL LOW

## 2020-04-10 MED ORDER — BUMETANIDE 2 MG TABLET
ORAL_TABLET | Freq: Two times a day (BID) | ORAL | 0 refills | 30 days | Status: CP
Start: 2020-04-10 — End: 2020-05-10

## 2020-04-10 MED ORDER — RIFAXIMIN 550 MG TABLET
ORAL_TABLET | Freq: Two times a day (BID) | ORAL | 11 refills | 30 days | Status: CP
Start: 2020-04-10 — End: ?

## 2020-04-10 MED ADMIN — sertraline (ZOLOFT) tablet 50 mg: 50 mg | ORAL | @ 13:00:00 | Stop: 2020-04-10

## 2020-04-10 MED ADMIN — insulin glargine (LANTUS) injection 30 Units: 30 [IU] | SUBCUTANEOUS | @ 14:00:00 | Stop: 2020-04-10

## 2020-04-10 MED ADMIN — busPIRone (BUSPAR) tablet 10 mg: 10 mg | ORAL | @ 13:00:00 | Stop: 2020-04-10

## 2020-04-10 MED ADMIN — lactulose (CHRONULAC) oral solution (30 mL cup): 20 g | ORAL | @ 16:00:00 | Stop: 2020-04-10

## 2020-04-10 MED ADMIN — levothyroxine (SYNTHROID) tablet 25 mcg: 25 ug | ORAL | @ 13:00:00 | Stop: 2020-04-10

## 2020-04-10 MED ADMIN — insulin lispro (HumaLOG) injection 1-20 Units: 1-20 [IU] | SUBCUTANEOUS | @ 02:00:00 | Stop: 2020-04-11

## 2020-04-10 MED ADMIN — atorvastatin (LIPITOR) tablet 10 mg: 10 mg | ORAL | @ 01:00:00

## 2020-04-10 MED ADMIN — rifAXIMin (XIFAXAN) tablet 550 mg: 550 mg | ORAL | @ 13:00:00 | Stop: 2020-04-10

## 2020-04-10 NOTE — Unmapped (Signed)
Marilyn French is a 64 y.o. female with PMHx of HTN, HLD, T2DM and cirrhosis in the setting of NAFLD and GAVE c/b transfusions and HE that presented to [facility] for observation post APC and GI banding procedure.    Acute vs. Chronic Blood Loss Anemia s/p variceal banding  She was admitted for GI bleed at OSH on 03/17/20 receiving 3 units of pRBCs. Presented to [facility] primarily for APC banding procedure in the setting of GI bleed requiring multiple transfusions. Hgb of 6.7 prior to procedure. Received one unit packed red blood cells, follow up Hgb was 8.3.  ??  Cirrhosis 2/2 NAFLD - GAVE  Presented for treatment of variceal bleeding 2/2 history of cirrhosis. Procedure completed on 7/23. Continued home lactulose 20g QID and rifaximin 550mg  BID  ??  T2DM   Managed at home with Lantus 45U nightly, insulin lispro 15-30U with meals, insulin aspart SSI. Held home meds and managed with SSI given unclear PO intake.

## 2020-04-10 NOTE — Unmapped (Signed)
Pt alert and oriented. Has some delay in speech. Husband at bedside.  Arrived on unit right at shift change. She arrived with a unit of blood at gravity.  Hemoglobin was 6.7 and labs will be obtained this am. Received a meal last night and rechecked BS was 269 and coverage was given . No falls during the night.  Currently on 2 liters fluid restrictions. Vitals signs stable.  Hourly rounds noted. Will continue to monitor.    Problem: Adult Inpatient Plan of Care  Goal: Plan of Care Review  Outcome: Progressing  Goal: Patient-Specific Goal (Individualization)  Outcome: Progressing  Goal: Absence of Hospital-Acquired Illness or Injury  Outcome: Progressing  Goal: Optimal Comfort and Wellbeing  Outcome: Progressing  Goal: Readiness for Transition of Care  Outcome: Progressing  Goal: Rounds/Family Conference  Outcome: Progressing     Problem: Self-Care Deficit  Goal: Improved Ability to Complete Activities of Daily Living  Outcome: Progressing     Problem: Fall Injury Risk  Goal: Absence of Fall and Fall-Related Injury  Outcome: Progressing

## 2020-04-10 NOTE — Unmapped (Signed)
Physician Discharge Summary [facility]  3 [facility]  [address]  [place] [address]  Dept: [phone]  Loc: [phone]     Identifying Information:   Marilyn French  04-27-1956  [id number]    Primary Care Physician: Ignatius Specking, MD     Code Status: Full Code    Admit Date: 04/09/2020    Discharge Date: 04/10/2020     Discharge To: Home    Discharge Service: [facility] - General Medicine Floor Team (MEDU)     Discharge Attending Physician: Georgiann Mccoy, MD    Discharge Diagnoses:  Principal Problem:    Chronic blood loss anemia POA: Yes  Active Problems:    NAFLD (nonalcoholic fatty liver disease) POA: Yes    Cirrhosis (CMS-HCC) POA: Yes    GAVE (gastric antral vascular ectasia) POA: Yes  Resolved Problems:    * No resolved hospital problems. *      Hospital Course:   Marilyn French is a 64 y.o. female with PMHx of HTN, HLD, T2DM and cirrhosis in the setting of NAFLD and GAVE c/b transfusions and HE that presented to [facility] for observation post APC and GI banding procedure.    Acute vs. Chronic Blood Loss Anemia s/p variceal banding  She was admitted for GI bleed at OSH on 03/17/20 receiving 3 units of pRBCs. Presented to [facility] primarily for APC banding procedure in the setting of GI bleed requiring multiple transfusions. Hgb of 6.7 prior to procedure. Received one unit packed red blood cells, follow up Hgb was 8.3.  ??  Cirrhosis 2/2 NAFLD - GAVE  Presented for treatment of variceal bleeding 2/2 history of cirrhosis. Procedure completed on 7/23. Continued home lactulose 20g QID and rifaximin 550mg  BID  ??  T2DM   Managed at home with Lantus 45U nightly, insulin lispro 15-30U with meals, insulin aspart SSI. Held home meds and managed with SSI given unclear PO intake.    The patient's hospital stay has been complicated by the following clinically significant conditions requiring additional evaluation and treatment or having a significant effect of this patient's care: - Anemia POA requiring further investigation or monitoring           Outpatient Provider Follow Up Issues:   Follow up with hepatologist for GAVE and chronic anemia. Number to call for refills for rifaximin because pt getting at shared service center: [phone].      Procedures:  EGD  ______________________________________________________________________  Discharge Medications:     Your Medication List      ASK your doctor about these medications    bumetanide 2 MG tablet  Commonly known as: BUMEX  Take 1 tablet (2 mg total) by mouth daily.     busPIRone 10 MG tablet  Commonly known as: BUSPAR  Take 1 tablet (10 mg total) by mouth two (2) times a day.     cholecalciferol-25 mcg (1,000 unit) 1,000 unit (25 mcg) tablet  Generic drug: cholecalciferol (vitamin D3 25 mcg (1,000 units))  Take 1,000 Units by mouth every other day.     flintstones complete tablet  Generic drug: pediatric multivit-iron-min  Chew 2 tablets daily.     insulin lispro 100 unit/mL injection  Commonly known as: HumaLOG  INJECT 15 TO 35 UNITS SUBCUTANEUOSLY PER SLIDING SCALE 3 TIMES DAILY     lactulose 10 gram/15 mL solution  Commonly known as: CHRONULAC  Take 20 g by mouth Four (4) times a day.     LANTUS SOLOSTAR U-100 INSULIN 100 unit/mL (  3 mL) injection pen  Generic drug: insulin glargine  Inject 0.45 mL (45 Units total) under the skin daily.     levothyroxine 25 MCG tablet  Commonly known as: SYNTHROID  Take 25 mcg by mouth daily.     MAGNESIUM ORAL  Take by mouth.     pen needle, diabetic 31 gauge x 1/4 (6 mm) Ndle     potassium chloride 20 MEQ CR tablet  Commonly known as: KLOR-CON  Take 20 mEq by mouth daily.     rifAXIMin 550 mg Tab  Commonly known as: XIFAXAN  Take 1 tablet (550 mg total) by mouth Two (2) times a day.     rosuvastatin 5 MG tablet  Commonly known as: CRESTOR  Take 5 mg by mouth every other day.     sertraline 50 MG tablet  Commonly known as: ZOLOFT  Take 1 tablet (50 mg total) by mouth daily. 1 tablet (50mg ) by mouth daily.     spironolactone 100 MG tablet  Commonly known as: ALDACTONE  Take 1 tablet (100 mg total) by mouth daily.     ZINC ACETATE ORAL  Take by mouth.            Allergies:  Ferumoxytol, Definity [perflutren lipid microspheres], and Tramadol hcl  ______________________________________________________________________  Pending Test Results:      Most Recent Labs:  All lab results last 24 hours -   Recent Results (from the past 24 hour(s))   Prepare RBC    Collection Time: 04/09/20  5:28 PM   Result Value Ref Range    Crossmatch Compatible     Unit Blood Type O Neg     ISBT Number 9500     Unit # [id number]     Status Issued     Spec Expiration [id number]     Product ID Red Blood Cells     PRODUCT CODE [id number]    POCT Glucose    Collection Time: 04/09/20  7:49 PM   Result Value Ref Range    Glucose, POC 182 (H) 70 - 179 mg/dL   POCT Glucose    Collection Time: 04/09/20 10:15 PM   Result Value Ref Range    Glucose, POC 269 (H) 70 - 179 mg/dL   CBC    Collection Time: 04/10/20  5:17 AM   Result Value Ref Range    WBC 4.3 (L) 4.5 - 11.0 10*9/L    RBC 2.98 (L) 4.00 - 5.20 10*12/L    HGB 8.3 (L) 12.0 - 16.0 g/dL    HCT 24.4 (L) 01.0 - 46.0 %    MCV 87.5 80.0 - 100.0 fL    MCH 28.0 26.0 - 34.0 pg    MCHC 32.0 31.0 - 37.0 g/dL    RDW 27.2 (H) 53.6 - 15.0 %    MPV 13.4 (H) 7.0 - 10.0 fL    Platelet 56 (L) 150 - 440 10*9/L   POCT Glucose    Collection Time: 04/10/20  9:14 AM   Result Value Ref Range    Glucose, POC 159 70 - 179 mg/dL   POCT Glucose    Collection Time: 04/10/20 12:27 PM   Result Value Ref Range    Glucose, POC 238 (H) 70 - 179 mg/dL       Relevant Studies/Radiology:  No results found.  ______________________________________________________________________  Discharge Instructions:   You were admitted for follow up from your GI procedure and because of anemia. You tolerated the procedure well and your anemia  remained stable at 8.3. You should call either you local liver doctor or the liver doctors at [facility] to set up a follow up appointment from your hospitalization in 2-3 weeks. The number to reach the [facility] doctors is: [phone].       Discharge Instructions for Gastrointestinal Bleeding        Gastrointestinal (gas-troh-in-TES-ti-nal) bleeding may happen when you have another disease or condition. Gastrointestinal (GI) bleeding can occur anywhere in your esophagus (e-SOF-ah-gus), stomach, and the large and small intestines (in-TES-tins). It is important to find and treat the cause of your bleeding, even if it stops on its own. The cause of your bleeding may be a minor problem. GI bleeding can also be a sign of a more serious condition.      Medicines:        Keep a written list of the medicines you take, the dose, and when and why you take them. Bring the list of your medicines or the pill bottles when you see your caregivers. Learn why you take each medicine. Ask your caregiver for information about your medicine.      There are many medicines that may cause GI bleeding. Do not take any medicines, over-the-counter drugs, vitamins, herbs, or food supplements without first talking to caregivers. Do not take any medicine that has aspirin, naproxen, or ibuprofen in it without first asking your caregivers.      Always take your medicine as directed by caregivers. Call your caregiver if you think your medicines are not helping, or if you feel you are having side effects. Do not quit taking your medicines until you discuss it with your caregiver. If you are taking antibiotics (an-ti-bi-AH-tiks), take them until they are all gone even if you feel better.      If you are taking medicine that makes you drowsy, do not drive or use heavy equipment.      If you have other medical conditions such as high blood pressure, you need to control them. Take medicines as directed. Some medical conditions may increase your risk of GI bleeding, especially if they are not well-controlled.      Activity:        You may feel like resting more as you recover from your GI bleed. Slowly start to do more each day. Rest when you feel it is needed.      Ask your caregiver how long you need to wait before starting your usual activities.      Ask your caregiver when you can return to work.      Avoid lifting heavy objects.      How can I live a healthy lifestyle?  Living a more healthy lifestyle may decrease your risk of having GI bleeding in the future. Ask your caregiver about things you can do to decrease your bleeding risk.        Alcohol: Ask your caregiver if you should stop drinking alcohol or decrease the amount of alcohol you drink. Drinking too much alcohol can damage your brain, heart, and liver. Drinking alcohol can also make your illness worse.      Constipation: If you are constipated, you may have a hard time having a bowel movement (BM) Do not try to push the BM out if it is too hard. Walking is the best way to get your bowels moving. Eat foods high in fiber to make it easier to have a BM. Good examples are high fiber cereals, beans, vegetables, and whole grain  breads. Prune juice may help make the BM softer. Caregivers may give you fiber medicine or a stool softener to help make your BMs softer and more regular. You can also buy these medicines at a grocery or drug store.      Diet: Ask your caregiver if you need to be on a special diet. Drink six to eight cups of liquid each day. Follow your caregiver's advice if you must limit the amount of liquid you drink. decrease the amount of caffeine you eat and drink. Caffeine may be found in coffee, tea, soda, sports drinks, chocolate, and food bars.      Quit smoking: If you smoke, you should quit. Quitting smoking will improve your health and the health of those around you.      For support and more information:  You can contact one of the following national organizations for more information.        [employer] for Functional Gastrointestinal Disorders      [address]      [place] , [place] [address] Phone: [phone]      Phone: [phone]      Web Address: http://www.iffgd.org        National Digestive [employer] Information Clearinghouse (NDDIC)      [address]      [place] , [place] [address]      Phone: [phone]      Web Address: www.digestive.[employer].si        American Society for Gastrointestinal Endoscopy (ASGE)      [address]      [place] , [place] [address]      Phone: [phone]      Phone: [phone]      Web Address: http://www.asge.org      CONTACT A CAREGIVER IF:        You have BMs that are sticky, tarry, or black, and are not caused by certain medicines taken or foods eaten. Iron pills, bismuth (Pepto Bismol ), and certain foods (such as beets) may cause your BMs to look tarry or black.      You start having abdominal (belly) pain or swelling, nausea, or vomiting (throwing up).      You start having heartburn or other signs of stomach acid problems.      You have questions or concerns about your illness or medicine.      You have trouble breathing or your skin is itchy, swollen, or has a rash. Your medicine may be causing these symptoms. This may mean you are allergic (al-ER-jik) to your medicine.      SEEK CARE IMMEDIATELY IF:        Vomiting (throwing up) bright red blood. Blood in the vomit can also be dark red or black. The vomit may look like coffee grounds.      You feel like you are going to have a BM, but pass only blood or blood clots into the toilet.      You have signs of shock or losing too much blood. If you have one or more of the following signs and symptoms that are new for you, Call 9-1-1 or 0 (operator) . This will call an ambulance to take you to the nearest hospital or clinic. Do not drive yourself!          Chest pain.          Dizziness or fainting, especially  after suddenly moving from a sitting or lying position.          Feeling confused or short of breath.          You feel too weak to stand up. ______________________________________________________________________  Discharge Day Services:  BP 136/46  - Pulse 79  - Temp 36.7 ??C (Oral)  - Resp 16  - Ht 160 cm (5' 3)  - Wt 88.2 kg (194 lb 7.1 oz)  - LMP  (LMP Unknown)  - SpO2 100%  - BMI 34.44 kg/m??     Pt seen on the day of discharge and determined appropriate for discharge.    Condition at Discharge: good    Length of Discharge: I spent greater than 30 mins in the discharge of this patient.

## 2020-04-13 ENCOUNTER — Other Ambulatory Visit: Payer: Self-pay

## 2020-04-13 ENCOUNTER — Encounter (HOSPITAL_COMMUNITY)
Admission: RE | Admit: 2020-04-13 | Discharge: 2020-04-13 | Disposition: A | Discharge status: Home / Self Care | Payer: PPO | Source: Ambulatory Visit | Attending: Nephrology | Admitting: Nephrology

## 2020-04-13 ENCOUNTER — Other Ambulatory Visit (INDEPENDENT_AMBULATORY_CARE_PROVIDER_SITE_OTHER): Payer: Self-pay | Admitting: Internal Medicine

## 2020-04-13 DIAGNOSIS — K7460 Unspecified cirrhosis of liver: Secondary | ICD-10-CM

## 2020-04-13 DIAGNOSIS — K7581 Nonalcoholic steatohepatitis (NASH): Secondary | ICD-10-CM | POA: Diagnosis not present

## 2020-04-13 LAB — SAMPLE TO BLOOD BANK

## 2020-04-13 LAB — HEMOGLOBIN AND HEMATOCRIT, BLOOD
HCT: 26.5 % — ABNORMAL LOW (ref 36.0–46.0)
Hemoglobin: 7.9 g/dL — ABNORMAL LOW (ref 12.0–15.0)

## 2020-04-13 MED ORDER — HEPARIN SOD (PORK) LOCK FLUSH 100 UNIT/ML IV SOLN
500.0000 [IU] | Freq: Once | INTRAVENOUS | Status: AC
  Administered 2020-04-13: 500 [IU] via INTRAVENOUS

## 2020-04-14 DIAGNOSIS — Z299 Encounter for prophylactic measures, unspecified: Secondary | ICD-10-CM | POA: Diagnosis not present

## 2020-04-14 DIAGNOSIS — E083512 Diabetes mellitus due to underlying condition with proliferative diabetic retinopathy with macular edema, left eye: Secondary | ICD-10-CM | POA: Diagnosis not present

## 2020-04-14 DIAGNOSIS — K7460 Unspecified cirrhosis of liver: Secondary | ICD-10-CM | POA: Diagnosis not present

## 2020-04-14 DIAGNOSIS — I8500 Esophageal varices without bleeding: Secondary | ICD-10-CM | POA: Diagnosis not present

## 2020-04-14 DIAGNOSIS — E1165 Type 2 diabetes mellitus with hyperglycemia: Secondary | ICD-10-CM | POA: Diagnosis not present

## 2020-04-16 ENCOUNTER — Encounter (INDEPENDENT_AMBULATORY_CARE_PROVIDER_SITE_OTHER): Payer: PPO | Admitting: Ophthalmology

## 2020-04-16 ENCOUNTER — Other Ambulatory Visit: Payer: Self-pay

## 2020-04-16 DIAGNOSIS — H35033 Hypertensive retinopathy, bilateral: Secondary | ICD-10-CM

## 2020-04-16 DIAGNOSIS — H43813 Vitreous degeneration, bilateral: Secondary | ICD-10-CM | POA: Diagnosis not present

## 2020-04-16 DIAGNOSIS — I10 Essential (primary) hypertension: Secondary | ICD-10-CM | POA: Diagnosis not present

## 2020-04-16 DIAGNOSIS — E11311 Type 2 diabetes mellitus with unspecified diabetic retinopathy with macular edema: Secondary | ICD-10-CM

## 2020-04-16 DIAGNOSIS — E113291 Type 2 diabetes mellitus with mild nonproliferative diabetic retinopathy without macular edema, right eye: Secondary | ICD-10-CM | POA: Diagnosis not present

## 2020-04-16 DIAGNOSIS — E113312 Type 2 diabetes mellitus with moderate nonproliferative diabetic retinopathy with macular edema, left eye: Secondary | ICD-10-CM

## 2020-04-16 DIAGNOSIS — E119 Type 2 diabetes mellitus without complications: Secondary | ICD-10-CM | POA: Diagnosis not present

## 2020-04-19 ENCOUNTER — Other Ambulatory Visit: Payer: Self-pay

## 2020-04-19 ENCOUNTER — Encounter (HOSPITAL_COMMUNITY): Payer: Self-pay

## 2020-04-19 ENCOUNTER — Encounter (HOSPITAL_COMMUNITY)
Admission: RE | Admit: 2020-04-19 | Discharge: 2020-04-19 | Disposition: A | Discharge status: Home / Self Care | Payer: PPO | Source: Ambulatory Visit | Attending: Internal Medicine | Admitting: Internal Medicine

## 2020-04-19 DIAGNOSIS — K7581 Nonalcoholic steatohepatitis (NASH): Secondary | ICD-10-CM | POA: Insufficient documentation

## 2020-04-19 LAB — COMPREHENSIVE METABOLIC PANEL
ALT: 26 U/L (ref 0–44)
AST: 38 U/L (ref 15–41)
Albumin: 2.1 g/dL — ABNORMAL LOW (ref 3.5–5.0)
Alkaline Phosphatase: 85 U/L (ref 38–126)
Anion gap: 6 (ref 5–15)
BUN: 22 mg/dL (ref 8–23)
CO2: 20 mmol/L — ABNORMAL LOW (ref 22–32)
Calcium: 7.5 mg/dL — ABNORMAL LOW (ref 8.9–10.3)
Chloride: 108 mmol/L (ref 98–111)
Creatinine, Ser: 1.12 mg/dL — ABNORMAL HIGH (ref 0.44–1.00)
GFR calc Af Amer: >60 mL/min (ref >60)
GFR calc non Af Amer: 52 mL/min — ABNORMAL LOW (ref >60)
Glucose, Bld: 215 mg/dL — ABNORMAL HIGH (ref 70–99)
Potassium: 4.8 mmol/L (ref 3.5–5.1)
Sodium: 134 mmol/L — ABNORMAL LOW (ref 135–145)
Total Bilirubin: 2.4 mg/dL — ABNORMAL HIGH (ref 0.3–1.2)
Total Protein: 4.5 g/dL — ABNORMAL LOW (ref 6.5–8.1)

## 2020-04-19 LAB — SAMPLE TO BLOOD BANK

## 2020-04-19 LAB — HEMOGLOBIN AND HEMATOCRIT, BLOOD
HCT: 27 % — ABNORMAL LOW (ref 36.0–46.0)
Hemoglobin: 8 g/dL — ABNORMAL LOW (ref 12.0–15.0)

## 2020-04-19 MED ORDER — HEPARIN SOD (PORK) LOCK FLUSH 100 UNIT/ML IV SOLN
500.0000 [IU] | Freq: Once | INTRAVENOUS | Status: AC
  Administered 2020-04-19: 500 [IU] via INTRAVENOUS

## 2020-04-26 ENCOUNTER — Encounter (HOSPITAL_COMMUNITY): Admission: RE | Admit: 2020-04-26 | Payer: PPO | Source: Ambulatory Visit

## 2020-04-26 NOTE — Unmapped (Signed)
Received referral to verify patients insurance  ??  Insurance has been verified.   ??  ??Patient has active coverage with [employer], including prescription drug coverage via [employer] [phone]  ??  Authorization is [phone] for Liver transplant listing valid 04/05/20 to 04/05/21.   ??  ??Patient is financially cleared for Liver transplant listing

## 2020-04-27 ENCOUNTER — Encounter (HOSPITAL_COMMUNITY)
Admission: RE | Admit: 2020-04-27 | Discharge: 2020-04-27 | Disposition: A | Discharge status: Home / Self Care | Payer: PPO | Source: Ambulatory Visit | Attending: Internal Medicine | Admitting: Internal Medicine

## 2020-04-27 ENCOUNTER — Other Ambulatory Visit: Payer: Self-pay

## 2020-04-27 DIAGNOSIS — K7581 Nonalcoholic steatohepatitis (NASH): Secondary | ICD-10-CM | POA: Diagnosis not present

## 2020-04-27 LAB — SAMPLE TO BLOOD BANK

## 2020-04-27 LAB — PREPARE RBC (CROSSMATCH)

## 2020-04-27 LAB — HEMOGLOBIN AND HEMATOCRIT, BLOOD
HCT: 24.4 % — ABNORMAL LOW (ref 36.0–46.0)
Hemoglobin: 7 g/dL — ABNORMAL LOW (ref 12.0–15.0)

## 2020-04-27 MED ORDER — HEPARIN SOD (PORK) LOCK FLUSH 100 UNIT/ML IV SOLN
500.0000 [IU] | Freq: Once | INTRAVENOUS | Status: AC
  Administered 2020-04-27: 500 [IU] via INTRAVENOUS

## 2020-04-27 MED ORDER — HEPARIN SOD (PORK) LOCK FLUSH 100 UNIT/ML IV SOLN
INTRAVENOUS | Status: AC
  Filled 2020-04-27: qty 5

## 2020-04-28 ENCOUNTER — Encounter (HOSPITAL_COMMUNITY): Payer: Self-pay

## 2020-04-28 ENCOUNTER — Encounter (HOSPITAL_COMMUNITY)
Admission: RE | Admit: 2020-04-28 | Discharge: 2020-04-28 | Disposition: A | Discharge status: Home / Self Care | Payer: PPO | Source: Ambulatory Visit | Attending: Internal Medicine | Admitting: Internal Medicine

## 2020-04-28 DIAGNOSIS — K7581 Nonalcoholic steatohepatitis (NASH): Secondary | ICD-10-CM | POA: Diagnosis not present

## 2020-04-28 MED ORDER — SODIUM CHLORIDE 0.9% IV SOLUTION: Freq: Once | INTRAVENOUS | Status: AC

## 2020-04-28 MED ORDER — HEPARIN SOD (PORK) LOCK FLUSH 100 UNIT/ML IV SOLN
INTRAVENOUS | Status: AC
  Filled 2020-04-28: qty 5

## 2020-04-28 MED ORDER — HEPARIN SOD (PORK) LOCK FLUSH 100 UNIT/ML IV SOLN
500.0000 [IU] | INTRAVENOUS | Status: AC | PRN
  Administered 2020-04-28: 500 [IU]

## 2020-04-29 ENCOUNTER — Telehealth (INDEPENDENT_AMBULATORY_CARE_PROVIDER_SITE_OTHER): Payer: Self-pay | Admitting: Internal Medicine

## 2020-04-29 LAB — TYPE AND SCREEN
ABO/RH(D): O NEG
Antibody Screen: NEGATIVE
Unit division: 0
Unit division: 0

## 2020-04-29 LAB — BPAM RBC
Blood Product Expiration Date: 202109012359
Blood Product Expiration Date: 202109012359
ISSUE DATE / TIME: 202108111023
ISSUE DATE / TIME: 202108111235
Unit Type and Rh: 9500
Unit Type and Rh: 9500

## 2020-04-29 NOTE — Telephone Encounter (Signed)
Spouse called wanted to make sure we contacted Trish at [facility] about patient having 2 units of blood

## 2020-04-29 NOTE — Telephone Encounter (Signed)
I will share with Dr.Rehman.

## 2020-05-02 NOTE — Telephone Encounter (Signed)
Too late for me to call I would not have any objection if [name] called [facility] himself

## 2020-05-03 ENCOUNTER — Telehealth (INDEPENDENT_AMBULATORY_CARE_PROVIDER_SITE_OTHER): Payer: Self-pay | Admitting: *Deleted

## 2020-05-03 NOTE — Telephone Encounter (Signed)
Per Dr.Rehman the patient is to have drawn  a H&H every Tuesday. I will check with Tomi Myers,RN . Patient will be advised.

## 2020-05-03 NOTE — Telephone Encounter (Signed)
Michale called - Izora Gala hasn't been sch'd for blood draw this week - did [name] order

## 2020-05-04 ENCOUNTER — Other Ambulatory Visit: Payer: Self-pay

## 2020-05-04 ENCOUNTER — Encounter (HOSPITAL_COMMUNITY)
Admission: RE | Admit: 2020-05-04 | Discharge: 2020-05-04 | Disposition: A | Discharge status: Home / Self Care | Payer: PPO | Source: Ambulatory Visit | Attending: Internal Medicine | Admitting: Internal Medicine

## 2020-05-04 ENCOUNTER — Other Ambulatory Visit (HOSPITAL_COMMUNITY): Payer: PPO

## 2020-05-04 DIAGNOSIS — K7581 Nonalcoholic steatohepatitis (NASH): Secondary | ICD-10-CM | POA: Diagnosis not present

## 2020-05-04 LAB — SAMPLE TO BLOOD BANK

## 2020-05-04 LAB — HEMOGLOBIN AND HEMATOCRIT, BLOOD
HCT: 31.6 % — ABNORMAL LOW (ref 36.0–46.0)
Hemoglobin: 9.5 g/dL — ABNORMAL LOW (ref 12.0–15.0)

## 2020-05-04 MED ORDER — HEPARIN SOD (PORK) LOCK FLUSH 100 UNIT/ML IV SOLN
500.0000 [IU] | Freq: Once | INTRAVENOUS | Status: AC
  Administered 2020-05-04: 500 [IU] via INTRAVENOUS

## 2020-05-11 ENCOUNTER — Encounter (HOSPITAL_COMMUNITY)
Admission: RE | Admit: 2020-05-11 | Discharge: 2020-05-11 | Disposition: A | Discharge status: Home / Self Care | Payer: PPO | Source: Ambulatory Visit | Attending: Internal Medicine | Admitting: Internal Medicine

## 2020-05-11 ENCOUNTER — Other Ambulatory Visit: Payer: Self-pay

## 2020-05-11 DIAGNOSIS — K7581 Nonalcoholic steatohepatitis (NASH): Secondary | ICD-10-CM | POA: Diagnosis not present

## 2020-05-11 LAB — SAMPLE TO BLOOD BANK

## 2020-05-11 LAB — HEMOGLOBIN AND HEMATOCRIT, BLOOD
HCT: 29.6 % — ABNORMAL LOW (ref 36.0–46.0)
Hemoglobin: 9 g/dL — ABNORMAL LOW (ref 12.0–15.0)

## 2020-05-11 MED ORDER — HEPARIN SOD (PORK) LOCK FLUSH 100 UNIT/ML IV SOLN
500.0000 [IU] | Freq: Once | INTRAVENOUS | Status: AC
  Administered 2020-05-11: 500 [IU] via INTRAVENOUS

## 2020-05-17 DIAGNOSIS — K7290 Hepatic failure, unspecified without coma: Principal | ICD-10-CM

## 2020-05-17 MED ORDER — SERTRALINE 50 MG TABLET: 50 mg | tablet | Freq: Every day | 11 refills | 30 days | Status: AC

## 2020-05-17 MED ORDER — SERTRALINE 50 MG TABLET
ORAL_TABLET | Freq: Every day | ORAL | 0 refills | 0.00000 days | Status: CP
Start: 2020-05-17 — End: 2020-05-17

## 2020-05-17 MED ORDER — RIFAXIMIN 550 MG TABLET
ORAL_TABLET | Freq: Two times a day (BID) | ORAL | 11 refills | 30 days | Status: CP
Start: 2020-05-17 — End: ?

## 2020-05-17 NOTE — Unmapped (Signed)
Pt request RX Refill Sertraline HCl 50 MG Oral Tablet

## 2020-05-17 NOTE — Unmapped (Signed)
On call page received from [name] - spoke with her spouse Casimiro Needle who requested refills for xifaxan and zoloft - rx's sent to local pharmacy

## 2020-05-18 ENCOUNTER — Encounter (HOSPITAL_COMMUNITY)
Admission: RE | Admit: 2020-05-18 | Discharge: 2020-05-18 | Disposition: A | Discharge status: Home / Self Care | Payer: PPO | Source: Ambulatory Visit | Attending: Internal Medicine | Admitting: Internal Medicine

## 2020-05-18 ENCOUNTER — Other Ambulatory Visit: Payer: Self-pay

## 2020-05-18 DIAGNOSIS — E119 Type 2 diabetes mellitus without complications: Secondary | ICD-10-CM | POA: Diagnosis not present

## 2020-05-18 DIAGNOSIS — I10 Essential (primary) hypertension: Secondary | ICD-10-CM | POA: Diagnosis not present

## 2020-05-18 DIAGNOSIS — K7581 Nonalcoholic steatohepatitis (NASH): Secondary | ICD-10-CM | POA: Diagnosis not present

## 2020-05-18 LAB — SAMPLE TO BLOOD BANK

## 2020-05-18 LAB — HEMOGLOBIN AND HEMATOCRIT, BLOOD
HCT: 25.8 % — ABNORMAL LOW (ref 36.0–46.0)
Hemoglobin: 7.4 g/dL — ABNORMAL LOW (ref 12.0–15.0)

## 2020-05-18 MED ORDER — HEPARIN SOD (PORK) LOCK FLUSH 100 UNIT/ML IV SOLN
500.0000 [IU] | Freq: Once | INTRAVENOUS | Status: AC
  Administered 2020-05-18: 500 [IU] via INTRAVENOUS
  Filled 2020-05-18: qty 5

## 2020-05-20 ENCOUNTER — Telehealth (INDEPENDENT_AMBULATORY_CARE_PROVIDER_SITE_OTHER): Payer: Self-pay | Admitting: Internal Medicine

## 2020-05-20 ENCOUNTER — Other Ambulatory Visit: Payer: Self-pay

## 2020-05-20 ENCOUNTER — Encounter (HOSPITAL_COMMUNITY)
Admission: RE | Admit: 2020-05-20 | Discharge: 2020-05-20 | Disposition: A | Discharge status: Home / Self Care | Payer: PPO | Source: Ambulatory Visit | Attending: Internal Medicine | Admitting: Internal Medicine

## 2020-05-20 DIAGNOSIS — K7581 Nonalcoholic steatohepatitis (NASH): Secondary | ICD-10-CM | POA: Insufficient documentation

## 2020-05-20 LAB — SAMPLE TO BLOOD BANK

## 2020-05-20 LAB — HEMOGLOBIN AND HEMATOCRIT, BLOOD
HCT: 25.6 % — ABNORMAL LOW (ref 36.0–46.0)
Hemoglobin: 7.5 g/dL — ABNORMAL LOW (ref 12.0–15.0)

## 2020-05-20 MED ORDER — HEPARIN SOD (PORK) LOCK FLUSH 100 UNIT/ML IV SOLN
500.0000 [IU] | Freq: Once | INTRAVENOUS | Status: AC
  Administered 2020-05-20: 500 [IU] via INTRAVENOUS

## 2020-05-20 NOTE — Telephone Encounter (Signed)
Patients spouse has called several times regarding patients hemoglobin being 7.4 and states last week it was at 9 - he thinks she should be having a transfusion - [name] is aware of this - he states he doesn't want to take her to the ED because of all the Covid

## 2020-05-25 ENCOUNTER — Emergency Department (HOSPITAL_COMMUNITY)
Admission: EM | Admit: 2020-05-25 | Discharge: 2020-05-26 | Disposition: A | Discharge status: Home / Self Care | Payer: PPO | Attending: Emergency Medicine | Admitting: Emergency Medicine

## 2020-05-25 ENCOUNTER — Other Ambulatory Visit: Payer: Self-pay

## 2020-05-25 ENCOUNTER — Encounter (HOSPITAL_COMMUNITY)
Admission: RE | Admit: 2020-05-25 | Discharge: 2020-05-25 | Disposition: A | Discharge status: Home / Self Care | Payer: PPO | Source: Ambulatory Visit | Attending: Internal Medicine | Admitting: Internal Medicine

## 2020-05-25 ENCOUNTER — Encounter (HOSPITAL_COMMUNITY): Payer: Self-pay | Admitting: Emergency Medicine

## 2020-05-25 DIAGNOSIS — Z20822 Contact with and (suspected) exposure to covid-19: Secondary | ICD-10-CM | POA: Diagnosis not present

## 2020-05-25 DIAGNOSIS — I5033 Acute on chronic diastolic (congestive) heart failure: Secondary | ICD-10-CM | POA: Insufficient documentation

## 2020-05-25 DIAGNOSIS — E119 Type 2 diabetes mellitus without complications: Secondary | ICD-10-CM | POA: Insufficient documentation

## 2020-05-25 DIAGNOSIS — Z794 Long term (current) use of insulin: Secondary | ICD-10-CM | POA: Diagnosis not present

## 2020-05-25 DIAGNOSIS — E039 Hypothyroidism, unspecified: Secondary | ICD-10-CM | POA: Insufficient documentation

## 2020-05-25 DIAGNOSIS — Z79899 Other long term (current) drug therapy: Secondary | ICD-10-CM | POA: Insufficient documentation

## 2020-05-25 DIAGNOSIS — D649 Anemia, unspecified: Secondary | ICD-10-CM | POA: Insufficient documentation

## 2020-05-25 DIAGNOSIS — Z79890 Hormone replacement therapy (postmenopausal): Secondary | ICD-10-CM | POA: Insufficient documentation

## 2020-05-25 DIAGNOSIS — R05 Cough: Secondary | ICD-10-CM | POA: Diagnosis not present

## 2020-05-25 DIAGNOSIS — I10 Essential (primary) hypertension: Secondary | ICD-10-CM | POA: Diagnosis not present

## 2020-05-25 DIAGNOSIS — I110 Hypertensive heart disease with heart failure: Secondary | ICD-10-CM | POA: Diagnosis not present

## 2020-05-25 DIAGNOSIS — K7581 Nonalcoholic steatohepatitis (NASH): Secondary | ICD-10-CM | POA: Insufficient documentation

## 2020-05-25 LAB — CBC WITH DIFFERENTIAL/PLATELET
Abs Immature Granulocytes: 0.01 10*3/uL (ref 0.00–0.07)
Basophils Absolute: 0 10*3/uL (ref 0.0–0.1)
Basophils Relative: 0 %
Eosinophils Absolute: 0 10*3/uL (ref 0.0–0.5)
Eosinophils Relative: 1 %
HCT: 18.7 % — ABNORMAL LOW (ref 36.0–46.0)
Hemoglobin: 5.4 g/dL — CL (ref 12.0–15.0)
Immature Granulocytes: 0 %
Lymphocytes Relative: 24 %
Lymphs Abs: 0.6 10*3/uL — ABNORMAL LOW (ref 0.7–4.0)
MCH: 26.7 pg (ref 26.0–34.0)
MCHC: 28.9 g/dL — ABNORMAL LOW (ref 30.0–36.0)
MCV: 92.6 fL (ref 80.0–100.0)
Monocytes Absolute: 0.3 10*3/uL (ref 0.1–1.0)
Monocytes Relative: 13 %
Neutro Abs: 1.5 10*3/uL — ABNORMAL LOW (ref 1.7–7.7)
Neutrophils Relative %: 62 %
Platelets: 49 10*3/uL — ABNORMAL LOW (ref 150–400)
RBC: 2.02 MIL/uL — ABNORMAL LOW (ref 3.87–5.11)
RDW: 19.6 % — ABNORMAL HIGH (ref 11.5–15.5)
WBC: 2.5 10*3/uL — ABNORMAL LOW (ref 4.0–10.5)
nRBC: 0 % (ref 0.0–0.2)

## 2020-05-25 LAB — COMPREHENSIVE METABOLIC PANEL
ALT: 24 U/L (ref 0–44)
AST: 27 U/L (ref 15–41)
Albumin: 2.1 g/dL — ABNORMAL LOW (ref 3.5–5.0)
Alkaline Phosphatase: 75 U/L (ref 38–126)
Anion gap: 8 (ref 5–15)
BUN: 31 mg/dL — ABNORMAL HIGH (ref 8–23)
CO2: 21 mmol/L — ABNORMAL LOW (ref 22–32)
Calcium: 7.8 mg/dL — ABNORMAL LOW (ref 8.9–10.3)
Chloride: 107 mmol/L (ref 98–111)
Creatinine, Ser: 1.37 mg/dL — ABNORMAL HIGH (ref 0.44–1.00)
GFR calc Af Amer: 47 mL/min — ABNORMAL LOW (ref >60)
GFR calc non Af Amer: 40 mL/min — ABNORMAL LOW (ref >60)
Glucose, Bld: 166 mg/dL — ABNORMAL HIGH (ref 70–99)
Potassium: 3.8 mmol/L (ref 3.5–5.1)
Sodium: 136 mmol/L (ref 135–145)
Total Bilirubin: 2.7 mg/dL — ABNORMAL HIGH (ref 0.3–1.2)
Total Protein: 4.2 g/dL — ABNORMAL LOW (ref 6.5–8.1)

## 2020-05-25 LAB — SAMPLE TO BLOOD BANK

## 2020-05-25 LAB — PREPARE RBC (CROSSMATCH)

## 2020-05-25 LAB — HEMOGLOBIN AND HEMATOCRIT, BLOOD
HCT: 20.3 % — ABNORMAL LOW (ref 36.0–46.0)
Hemoglobin: 5.8 g/dL — CL (ref 12.0–15.0)

## 2020-05-25 MED ORDER — HEPARIN SOD (PORK) LOCK FLUSH 100 UNIT/ML IV SOLN
500.0000 [IU] | Freq: Once | INTRAVENOUS | Status: AC
  Administered 2020-05-25: 500 [IU] via INTRAVENOUS

## 2020-05-25 MED ORDER — SODIUM CHLORIDE 0.9 % IV SOLN: 10.0000 mL/h | Freq: Once | INTRAVENOUS | Status: DC

## 2020-05-25 NOTE — ED Provider Notes (Signed)
[facility] EMERGENCY DEPARTMENT Provider Note   CSN: [id number] Arrival date & time: 05/25/20  1758     History Chief Complaint  Patient presents with  . Abnormal Lab    Maria [name] is a 64 y.o. female.  Patient has a history of cirrhosis and sees Dr. Wonda Amis.  Patient has anemia.  Patient was sent over here for transfusion.  Patient will get 2 units transfused  The history is provided by the patient and medical records. No language interpreter was used.  Weakness Severity:  Mild Onset quality:  Sudden Timing:  Constant Progression:  Waxing and waning Chronicity:  New Context: not alcohol use   Relieved by:  Nothing Worsened by:  Nothing Ineffective treatments:  None tried Associated symptoms: no abdominal pain, no chest pain, no cough, no diarrhea, no frequency, no headaches and no seizures        Past Medical History:  Diagnosis Date  . Anemia   . CHF (congestive heart failure) ([place])   . Cirrhosis ([place])   . Depression   . Diabetes mellitus   . Esophageal bleed, non-variceal   . Eye hemorrhage    Behind left eye  . Fibromyalgia   . Hypercholesteremia   . Hypertension   . Hypothyroidism   . NAFLD (nonalcoholic fatty liver disease)   . Osteoarthrosis   . Osteoporosis   . PONV (postoperative nausea and vomiting)   . UTI (urinary tract infection) 11/12 end of the month   Patient feels that she passed a kidney stone at that time    Patient Active Problem List   Diagnosis Date Noted  . Acute anemia 09/29/2019  . Hepatic encephalopathy ([place]) 08/27/2019  . Altered mental status 02/27/2019  . Bacteriuria 02/27/2019  . Acute on chronic anemia 02/24/2019  . Acute hepatic encephalopathy 07/26/2018  . Obesity, Class III, BMI 40-49.9 (morbid obesity) ([place])   . Melena   . Anemia 07/02/2018  . UGI bleed 11/09/2017  . Insulin dependent diabetes mellitus   . Absolute anemia 09/13/2017  . Idiopathic esophageal varices with bleeding ([place]) 09/13/2017  . Other  cirrhosis of liver ([place]) 08/22/2017  . NAFLD (nonalcoholic fatty liver disease) 08/14/2017  . Encephalopathy, hepatic ([place]) 08/14/2017  . Metabolic encephalopathy 61/95/0932  . Hyperammonemia ([place]) 07/18/2017  . Hypothyroidism 07/18/2017  . Acute on chronic right-sided congestive heart failure ([place]) 07/06/2017  . Pulmonary hypertension ([place]) 07/06/2017  . GAVE (gastric antral vascular ectasia) 07/06/2017  . GI bleeding 07/06/2017  . Symptomatic anemia 07/04/2017  . Hypokalemia 07/04/2017  . Chronic diastolic heart failure ([place]) 06/11/2017  . CAD (coronary artery disease) 06/11/2017  . Nocturnal hypoxemia 02/22/2017  . Idiopathic esophageal varices without bleeding ([place]) 01/31/2017  . Dyspnea and respiratory abnormalities 01/25/2017  . Esophageal varices in cirrhosis ([place]) 05/19/2016  . Hepatic cirrhosis ([place]) 05/19/2016  . Esophageal varices ([place]) 12/09/2012  . Dysphagia, unspecified(787.20) 12/09/2012  . Anxiety 08/26/2012  . Cirrhosis, nonalcoholic ([place]) 67/08/4579  . Diabetes mellitus ([place]) 10/09/2011  . History of esophageal varices 10/09/2011  . Iron deficiency anemia 10/09/2011  . GERD (gastroesophageal reflux disease) 10/09/2011  . Thrombocytopenia ([place]) 10/09/2011    Past Surgical History:  Procedure Laterality Date  . APPENDECTOMY  1980  . BILATERAL SALPINGOOPHORECTOMY    . CHOLECYSTECTOMY    . COLONOSCOPY  03/15/2011  . COLONOSCOPY N/A 03/24/2016   Procedure: COLONOSCOPY;  Surgeon: [name], MD;  [facility];  Service: Endoscopy;  Laterality: N/A;  855  . ESOPHAGEAL BANDING  04/24/2012  Procedure: ESOPHAGEAL BANDING;  Surgeon: [name], MD;  [facility];  Service: Endoscopy;  Laterality: N/A;  . Esophageal BANDING  08/03/2012   [facility] in [place] , [place]  09/24/2012   Procedure: ESOPHAGEAL BANDING;  Surgeon: [name], MD;  Location: [facility] OR[facility];  Service: Endoscopy;  Laterality: N/A;  Banding  x 3  . ESOPHAGEAL BANDING N/A 01/29/2013   Procedure: ESOPHAGEAL BANDING;  Surgeon: [name], MD;  [facility];  Service: Endoscopy;  Laterality: N/A;  . ESOPHAGEAL BANDING N/A 06/19/2014   Procedure: ESOPHAGEAL BANDING;  Surgeon: [name], MD;  [facility];  Service: Endoscopy;  Laterality: N/A;  . ESOPHAGEAL BANDING N/A 01/05/2016   Procedure: ESOPHAGEAL BANDING;  Surgeon: [name], MD;  [facility];  Service: Endoscopy;  Laterality: N/A;  . ESOPHAGEAL BANDING N/A 08/03/2016   Procedure: ESOPHAGEAL BANDING;  Surgeon: [name], MD;  [facility];  Service: Endoscopy;  Laterality: N/A;  . ESOPHAGEAL BANDING N/A 05/02/2017   Procedure: ESOPHAGEAL BANDING;  Surgeon: [name], MD;  [facility];  Service: Endoscopy;  Laterality: N/A;  . ESOPHAGOGASTRODUODENOSCOPY  04/24/2012   Procedure: ESOPHAGOGASTRODUODENOSCOPY (EGD);  Surgeon: [name], MD;  [facility];  Service: Endoscopy;  Laterality: N/A;  300  . ESOPHAGOGASTRODUODENOSCOPY N/A 01/29/2013   Procedure: ESOPHAGOGASTRODUODENOSCOPY (EGD);  Surgeon: [name], MD;  [facility];  Service: Endoscopy;  Laterality: N/A;  1200  . ESOPHAGOGASTRODUODENOSCOPY N/A 05/22/2013   Procedure: ESOPHAGOGASTRODUODENOSCOPY (EGD);  Surgeon: [name], MD;  [facility];  Service: Endoscopy;  Laterality: N/A;  1:55  . ESOPHAGOGASTRODUODENOSCOPY N/A 12/31/2013   Procedure: ESOPHAGOGASTRODUODENOSCOPY (EGD);  Surgeon: [name], MD;  [facility];  Service: Endoscopy;  Laterality: N/A;  1200  . ESOPHAGOGASTRODUODENOSCOPY N/A 06/19/2014   Procedure: ESOPHAGOGASTRODUODENOSCOPY (EGD);  Surgeon: [name], MD;  [facility];  Service: Endoscopy;  Laterality: N/A;  1055  . ESOPHAGOGASTRODUODENOSCOPY N/A 01/15/2015   Procedure: ESOPHAGOGASTRODUODENOSCOPY (EGD);  Surgeon: [name], MD;  [facility];  Service: Endoscopy;  Laterality: N/A;  730 - moved to 9:45 - moved to 1250-Ann notified pt  . ESOPHAGOGASTRODUODENOSCOPY N/A 06/30/2015   Procedure: ESOPHAGOGASTRODUODENOSCOPY (EGD);  Surgeon: [name], MD;  [facility];  Service: Endoscopy;  Laterality: N/A;  1200  . ESOPHAGOGASTRODUODENOSCOPY N/A 01/05/2016   Procedure: ESOPHAGOGASTRODUODENOSCOPY (EGD);  Surgeon: [name], MD;  [facility];  Service: Endoscopy;  Laterality: N/A;  830  . ESOPHAGOGASTRODUODENOSCOPY N/A 08/03/2016   Procedure: ESOPHAGOGASTRODUODENOSCOPY (EGD);  Surgeon: [name], MD;  [facility];  Service: Endoscopy;  Laterality: N/A;  930  . ESOPHAGOGASTRODUODENOSCOPY N/A 05/02/2017   Procedure: ESOPHAGOGASTRODUODENOSCOPY (EGD);  Surgeon: [name], MD;  [facility];  Service: Endoscopy;  Laterality: N/A;  12:00  . ESOPHAGOGASTRODUODENOSCOPY N/A 08/17/2017   Procedure: ESOPHAGOGASTRODUODENOSCOPY (EGD);  Surgeon: [name], MD;  [facility];  Service: Endoscopy;  Laterality: N/A;  8:15  . ESOPHAGOGASTRODUODENOSCOPY N/A 09/12/2017   Procedure: ESOPHAGOGASTRODUODENOSCOPY (EGD);  Surgeon: [name], MD;  [facility];  Service: Endoscopy;  Laterality: N/A;  1040  . ESOPHAGOGASTRODUODENOSCOPY N/A 10/10/2017   Procedure: ESOPHAGOGASTRODUODENOSCOPY (EGD);  Surgeon: [name], MD;  [facility];  Service: Endoscopy;  Laterality: N/A;  225  . ESOPHAGOGASTRODUODENOSCOPY N/A  11/09/2017   Procedure: ESOPHAGOGASTRODUODENOSCOPY (EGD);  Surgeon: [name], MD;  [facility];  Service: Endoscopy;  Laterality: N/A;  . ESOPHAGOGASTRODUODENOSCOPY (EGD) WITH PROPOFOL  09/24/2012   Procedure: ESOPHAGOGASTRODUODENOSCOPY (EGD) WITH PROPOFOL;  Surgeon: [name], MD;  Location: [facility];  Service: Endoscopy;  Laterality: N/A;  GE junction at 36  . ESOPHAGOGASTRODUODENOSCOPY (EGD) WITH PROPOFOL  N/A 07/06/2017   Procedure: ESOPHAGOGASTRODUODENOSCOPY (EGD) WITH PROPOFOL;  Surgeon: [name], MD;  [facility];  Service: Endoscopy;  Laterality: N/A;  . ESOPHAGOGASTRODUODENOSCOPY W/ BANDING  08/2010  . GIVENS CAPSULE STUDY N/A 07/05/2017   Procedure: GIVENS CAPSULE STUDY;  Surgeon: [name], MD;  [facility];  Service: Endoscopy;  Laterality: N/A;  . HOT HEMOSTASIS N/A 08/17/2017   Procedure: HOT HEMOSTASIS (ARGON PLASMA COAGULATION/BICAP);  Surgeon: [name], MD;  [facility];  Service: Endoscopy;  Laterality: N/A;  . HOT HEMOSTASIS  09/12/2017   Procedure: HOT HEMOSTASIS (ARGON PLASMA COAGULATION/BICAP);  Surgeon: [name], MD;  [facility];  Service: Endoscopy;;  gastric  . HOT HEMOSTASIS  10/10/2017   Procedure: HOT HEMOSTASIS (ARGON PLASMA COAGULATION/BICAP);  Surgeon: [name], MD;  [facility];  Service: Endoscopy;;  . POLYPECTOMY  03/24/2016   Procedure: POLYPECTOMY;  Surgeon: [name], MD;  [facility];  Service: Endoscopy;;  sigmoid polyp  . PORTACATH PLACEMENT Right 06/02/2019   Procedure: INSERTION PORT-A-CATH;  Surgeon: Aviva Signs, MD;  Location: [facility];  Service: General;  Laterality: Right;  . RIGHT HEART CATH N/A 04/16/2017   Procedure: Right Heart Cath;  Surgeon: [name], MD;  Location: [place] [facility];  Service: Cardiovascular;  Laterality: N/A;  . RIGHT HEART CATH N/A 07/12/2017   Procedure: RIGHT HEART CATH;  Surgeon: [name], MD;  Location: [place] [facility];  Service: Cardiovascular;  Laterality: N/A;  . TONSILLECTOMY    . UPPER GASTROINTESTINAL ENDOSCOPY  03/15/2011   EGD ED BANDING/TCS  . UPPER GASTROINTESTINAL ENDOSCOPY  09/05/2010  . UPPER GASTROINTESTINAL ENDOSCOPY  08/11/2010  . VAGINAL HYSTERECTOMY       OB History   No obstetric history on file.     Family History  Problem Relation Age of Onset  . Lung cancer  Mother   . Diabetes Father   . Diabetes Sister   . Hypertension Sister   . Hypothyroidism Sister   . Colon cancer Brother   . Diabetes Sister   . Hypothyroidism Brother   . Healthy Daughter   . Obesity Daughter   . Hypertension Daughter     Social History   Tobacco Use  . Smoking status: Never Smoker  . Smokeless tobacco: Never Used  Vaping Use  . Vaping Use: Never used  Substance Use Topics  . Alcohol use: No    Alcohol/week: 0.0 standard drinks  . Drug use: No    Home Medications Prior to Admission medications   Medication Sig Start Date End Date Taking? Authorizing Provider  bumetanide (BUMEX) 2 MG tablet Take 2 mg by mouth 2 (two) times daily.  09/10/19   Rehman, Mechele Dawley, MD  busPIRone (BUSPAR) 10 MG tablet Take 10 mg by mouth 2 (two) times daily.    [provider]  Cholecalciferol 25 MCG (1000 UT) tablet Take 1,000 Units by mouth every other day.     [provider]  HUMALOG 100 UNIT/ML injection Inject 15-35 Units into the skin  3 (three) times daily with meals. Sliding scale insulin 100-150= 15 units 150-200= 20 units 250-300= 30 units 300-350= 35 units 07/01/18   [provider]  Insulin Glargine (LANTUS SOLOSTAR) 100 UNIT/ML Solostar Pen Inject 10-60 Units into the skin See admin instructions. Inject 55 units subcutaneously in the morning & 10 units subcutaneously  if needed, in addition 03/06/19   [provider]  lactulose (CHRONULAC) 10 GM/15ML solution Take 60 mLs by mouth in the morning, at noon, in the evening, and at bedtime. *If having watery stools more than 3 times daily, reduce to taking 3 times daily 02/25/20   [provider]  levothyroxine (SYNTHROID, LEVOTHROID) 25 MCG tablet Take 25 mcg by mouth daily before breakfast.     [provider]  lidocaine-prilocaine (EMLA) cream Apply 1 application topically daily as needed (applied to port).  08/19/19   [provider]  magnesium oxide (MAG-OX)  400 MG tablet Take 400 mg by mouth at bedtime.     [provider]  pantoprazole (PROTONIX) 40 MG tablet Take 1 tablet (40 mg total) by mouth daily before breakfast. 09/09/19   Rehman, Mechele Dawley, MD  Pediatric Multivitamins-Iron (FLINTSTONES PLUS IRON PO) Take 2 tablets by mouth daily.    [provider]  potassium chloride SA (KLOR-CON) 20 MEQ tablet Take 2 tablets (40 mEq total) by mouth daily. Patient taking differently: Take 20 mEq by mouth 2 (two) times daily.  09/02/19   Enzo Bi, MD  rifaximin (XIFAXAN) 550 MG TABS tablet Take 1 tablet (550 mg total) by mouth 2 (two) times daily. 02/27/19   Barton Dubois, MD  rosuvastatin (CRESTOR) 10 MG tablet Take 10 mg by mouth daily. 09/10/19   [provider]  sertraline (ZOLOFT) 50 MG tablet Take 50 mg by mouth daily.  03/10/19   [provider]  spironolactone (ALDACTONE) 100 MG tablet Take 1 tablet (100 mg total) by mouth daily. 10/06/19 03/30/20  Manuella Ghazi, Pratik D, DO  trimethoprim-polymyxin b (POLYTRIM) ophthalmic solution Place 1 drop into the left eye See admin instructions. Use 3 to 4 times daily for 2 days following monthly eye injection 05/06/18   [provider]  zinc sulfate 220 (50 Zn) MG capsule Take 1 capsule (220 mg total) by mouth daily. 08/01/18   Barton Dubois, MD    Allergies    Ferumoxytol and Tramadol hcl  Review of Systems   Review of Systems  Constitutional: Negative for appetite change and fatigue.  HENT: Negative for congestion, ear discharge and sinus pressure.   Eyes: Negative for discharge.  Respiratory: Negative for cough.   Cardiovascular: Negative for chest pain.  Gastrointestinal: Negative for abdominal pain and diarrhea.  Genitourinary: Negative for frequency and hematuria.  Musculoskeletal: Negative for back pain.  Skin: Negative for rash.  Neurological: Positive for weakness. Negative for seizures and headaches.  Psychiatric/Behavioral: Negative for hallucinations.     Physical Exam Updated Vital Signs BP (!) 125/44   Pulse 83   Temp 98.3 F (36.8 C) (Oral)   Resp (!) 22   Ht 5' 3"  (1.6 m)   Wt 86 kg   SpO2 97%   BMI 33.59 kg/m   Physical Exam Vitals and nursing note reviewed.  Constitutional:      Appearance: She is well-developed.  HENT:     Head: Normocephalic.     Nose: Nose normal.  Eyes:     General: No scleral icterus.    Conjunctiva/sclera: Conjunctivae normal.  Neck:  Thyroid: No thyromegaly.  Cardiovascular:     Rate and Rhythm: Normal rate and regular rhythm.     Heart sounds: No murmur heard.  No friction rub. No gallop.   Pulmonary:     Breath sounds: No stridor. No wheezing or rales.  Chest:     Chest wall: No tenderness.  Abdominal:     General: There is no distension.     Tenderness: There is no abdominal tenderness. There is no rebound.  Musculoskeletal:        General: Normal range of motion.     Cervical back: Neck supple.  Lymphadenopathy:     Cervical: No cervical adenopathy.  Skin:    Findings: No erythema or rash.  Neurological:     Mental Status: She is alert and oriented to person, place, and time.     Motor: No abnormal muscle tone.     Coordination: Coordination normal.  Psychiatric:        Behavior: Behavior normal.     ED Results / Procedures / Treatments   Labs (all labs ordered are listed, but only abnormal results are displayed) Labs Reviewed  CBC WITH DIFFERENTIAL/PLATELET - Abnormal; Notable for the following components:      Result Value   WBC 2.5 (*)    RBC 2.02 (*)    Hemoglobin 5.4 (*)    HCT 18.7 (*)    MCHC 28.9 (*)    RDW 19.6 (*)    Platelets 49 (*)    Neutro Abs 1.5 (*)    Lymphs Abs 0.6 (*)    All other components within normal limits  COMPREHENSIVE METABOLIC PANEL - Abnormal; Notable for the following components:   CO2 21 (*)    Glucose, Bld 166 (*)    BUN 31 (*)    Creatinine, Ser 1.37 (*)    Calcium 7.8 (*)    Total Protein 4.2 (*)    Albumin 2.1 (*)     Total Bilirubin 2.7 (*)    GFR calc non Af Amer 40 (*)    GFR calc Af Amer 47 (*)    All other components within normal limits  TYPE AND SCREEN  PREPARE RBC (CROSSMATCH)    EKG None  Radiology No results found.  Procedures Procedures (including critical care time)  Medications Ordered in ED Medications  0.9 %  sodium chloride infusion (has no administration in time range)    ED Course  I have reviewed the triage vital signs and the nursing notes.  Pertinent labs & imaging results that were available during my care of the patient were reviewed by me and considered in my medical decision making (see chart for details).    CRITICAL CARE Performed by: Milton Ferguson Total critical care time: 45 minutes Critical care time was exclusive of separately billable procedures and treating other patients. Critical care was necessary to treat or prevent imminent or life-threatening deterioration. Critical care was time spent personally by me on the following activities: development of treatment plan with patient and/or surrogate as well as nursing, discussions with consultants, evaluation of patient's response to treatment, examination of patient, obtaining history from patient or surrogate, ordering and performing treatments and interventions, ordering and review of laboratory studies, ordering and review of radiographic studies, pulse oximetry and re-evaluation of patient's condition.  MDM Rules/Calculators/A&P                          Patient with anemia.  She  will be transfused 2 units packed red blood cells and sent home      This patient presents to the ED for concern of weakness, this involves an extensive number of treatment options, and is a complaint that carries with it a high risk of complications and morbidity.  The differential diagnosis includes anemia   Lab Tests:   I Ordered, reviewed, and interpreted labs, which included CBC and chemistries which showed anemia  with hemoglobin 5.4  Medicines ordered:   I ordered medication blood for anemia  Imaging Studies ordered:     Additional history obtained:   Additional history obtained from her gastroenterologist  Previous records obtained and reviewed.  Consultations Obtained:     Reevaluation:  After the interventions stated above, I reevaluated the patient and found improved  Critical Interventions:  .   Final Clinical Impression(s) / ED Diagnoses Final diagnoses:  None    Rx / DC Orders ED Discharge Orders    None       Milton Ferguson, MD 05/29/20 1533

## 2020-05-25 NOTE — ED Triage Notes (Signed)
Pt sent by PCP for low Hgb. Pt in no acute distress at this time.

## 2020-05-25 NOTE — ED Notes (Signed)
Date and time results received: 05/25/20 2145 (use smartphrase ".now" to insert current time)  Test: Hgb Critical Value: 5.4  Name of Provider Notified: [place]  Orders Received? Or Actions Taken?:

## 2020-05-26 MED ORDER — HEPARIN SOD (PORK) LOCK FLUSH 100 UNIT/ML IV SOLN
500.0000 [IU] | Freq: Once | INTRAVENOUS | Status: AC
  Administered 2020-05-26: 500 [IU] via INTRAVENOUS
  Filled 2020-05-26: qty 5

## 2020-05-26 NOTE — ED Provider Notes (Signed)
Blood pressure (!) 125/45, pulse 81, temperature 98.2 F (36.8 C), temperature source Oral, resp. rate (!) 22, height 5' 3"  (1.6 m), weight 86 kg, SpO2 100 %.  Assuming care from Dr. Roderic Palau.  In short, [name] is a 64 y.o. female with a chief complaint of Abnormal Lab .  Refer to the original H&P for additional details.  The current plan of care is to f/u after PRBC transfusion.  05:00 AM  Patient's second unit of PRBC has finished. Patient feeling improved and ready for discharge. She will f/u for repeat CBC in the next week. Discussed ED return precautions.     Margette Fast, MD 05/26/20 0500

## 2020-05-26 NOTE — Discharge Instructions (Signed)
You were seen in the ED today with low blood counts. We gave a blood transfusion but you will need repeat labs drawn in the coming week. Please call Dr. Laural Golden or your PCP to have this done. Return to the ED with any new or sudden worsening symptoms.

## 2020-05-27 ENCOUNTER — Encounter (INDEPENDENT_AMBULATORY_CARE_PROVIDER_SITE_OTHER): Payer: Self-pay | Admitting: *Deleted

## 2020-05-27 LAB — BPAM RBC
Blood Product Expiration Date: 202109252359
Blood Product Expiration Date: 202109252359
ISSUE DATE / TIME: 202109080010
ISSUE DATE / TIME: 202109080249
Unit Type and Rh: 9500
Unit Type and Rh: 9500

## 2020-05-27 LAB — TYPE AND SCREEN
ABO/RH(D): O NEG
Antibody Screen: NEGATIVE
Unit division: 0
Unit division: 0

## 2020-05-27 NOTE — Unmapped (Signed)
On call page received from [facility] husband - reports patient was recently treated @ local ED for Hgb > 6. No active s/s bleeding at this time. Patient due for upper endoscopy at the end of the month, will reach out to [facility] re: scheduling.

## 2020-06-01 ENCOUNTER — Other Ambulatory Visit: Payer: Self-pay

## 2020-06-01 ENCOUNTER — Encounter (HOSPITAL_COMMUNITY)
Admission: RE | Admit: 2020-06-01 | Discharge: 2020-06-01 | Disposition: A | Discharge status: Home / Self Care | Payer: PPO | Source: Ambulatory Visit | Attending: Internal Medicine | Admitting: Internal Medicine

## 2020-06-01 DIAGNOSIS — K7581 Nonalcoholic steatohepatitis (NASH): Secondary | ICD-10-CM | POA: Diagnosis not present

## 2020-06-01 LAB — PREPARE RBC (CROSSMATCH)

## 2020-06-01 LAB — SAMPLE TO BLOOD BANK

## 2020-06-01 LAB — HEMOGLOBIN AND HEMATOCRIT, BLOOD
HCT: 25.2 % — ABNORMAL LOW (ref 36.0–46.0)
Hemoglobin: 7.4 g/dL — ABNORMAL LOW (ref 12.0–15.0)

## 2020-06-01 MED ORDER — HEPARIN SOD (PORK) LOCK FLUSH 100 UNIT/ML IV SOLN
500.0000 [IU] | Freq: Once | INTRAVENOUS | Status: AC
  Administered 2020-06-01: 500 [IU] via INTRAVENOUS

## 2020-06-02 ENCOUNTER — Other Ambulatory Visit: Payer: Self-pay

## 2020-06-02 ENCOUNTER — Emergency Department (HOSPITAL_COMMUNITY)
Admission: EM | Admit: 2020-06-02 | Discharge: 2020-06-02 | Disposition: A | Discharge status: Home / Self Care | Payer: PPO | Attending: Emergency Medicine | Admitting: Emergency Medicine

## 2020-06-02 ENCOUNTER — Encounter (HOSPITAL_COMMUNITY)
Admission: RE | Admit: 2020-06-02 | Discharge: 2020-06-02 | Disposition: A | Discharge status: Home / Self Care | Payer: PPO | Source: Ambulatory Visit | Attending: Internal Medicine | Admitting: Internal Medicine

## 2020-06-02 ENCOUNTER — Emergency Department (HOSPITAL_COMMUNITY): Payer: PPO

## 2020-06-02 ENCOUNTER — Encounter (HOSPITAL_COMMUNITY): Payer: Self-pay

## 2020-06-02 DIAGNOSIS — I2510 Atherosclerotic heart disease of native coronary artery without angina pectoris: Secondary | ICD-10-CM | POA: Insufficient documentation

## 2020-06-02 DIAGNOSIS — Z794 Long term (current) use of insulin: Secondary | ICD-10-CM | POA: Insufficient documentation

## 2020-06-02 DIAGNOSIS — I5032 Chronic diastolic (congestive) heart failure: Secondary | ICD-10-CM | POA: Diagnosis not present

## 2020-06-02 DIAGNOSIS — Z79890 Hormone replacement therapy (postmenopausal): Secondary | ICD-10-CM | POA: Diagnosis not present

## 2020-06-02 DIAGNOSIS — Z79899 Other long term (current) drug therapy: Secondary | ICD-10-CM | POA: Diagnosis not present

## 2020-06-02 DIAGNOSIS — Z20822 Contact with and (suspected) exposure to covid-19: Secondary | ICD-10-CM | POA: Diagnosis not present

## 2020-06-02 DIAGNOSIS — I10 Essential (primary) hypertension: Secondary | ICD-10-CM | POA: Diagnosis not present

## 2020-06-02 DIAGNOSIS — E039 Hypothyroidism, unspecified: Secondary | ICD-10-CM | POA: Diagnosis not present

## 2020-06-02 DIAGNOSIS — D649 Anemia, unspecified: Secondary | ICD-10-CM | POA: Diagnosis not present

## 2020-06-02 DIAGNOSIS — I110 Hypertensive heart disease with heart failure: Secondary | ICD-10-CM | POA: Insufficient documentation

## 2020-06-02 DIAGNOSIS — U071 COVID-19: Secondary | ICD-10-CM | POA: Insufficient documentation

## 2020-06-02 DIAGNOSIS — I517 Cardiomegaly: Secondary | ICD-10-CM | POA: Diagnosis not present

## 2020-06-02 DIAGNOSIS — R05 Cough: Secondary | ICD-10-CM | POA: Diagnosis not present

## 2020-06-02 DIAGNOSIS — R059 Cough, unspecified: Secondary | ICD-10-CM

## 2020-06-02 LAB — SARS CORONAVIRUS 2 BY RT PCR (HOSPITAL ORDER, PERFORMED IN ~~LOC~~ HOSPITAL LAB): SARS Coronavirus 2: NEGATIVE

## 2020-06-02 MED ORDER — ACETAMINOPHEN 325 MG PO TABS
650.0000 mg | ORAL_TABLET | Freq: Once | ORAL | Status: AC
  Administered 2020-06-02: 650 mg via ORAL

## 2020-06-02 MED ORDER — DIPHENHYDRAMINE HCL 25 MG PO CAPS
25.0000 mg | ORAL_CAPSULE | Freq: Once | ORAL | Status: AC
  Administered 2020-06-02: 25 mg via ORAL

## 2020-06-02 MED ORDER — SODIUM CHLORIDE 0.9% IV SOLUTION: Freq: Once | INTRAVENOUS | Status: DC

## 2020-06-02 MED ORDER — FUROSEMIDE 10 MG/ML IJ SOLN: 40.0000 mg | Freq: Once | INTRAMUSCULAR | Status: DC

## 2020-06-02 NOTE — ED Provider Notes (Signed)
[facility] EMERGENCY DEPARTMENT Provider Note   CSN: [id number] Arrival date & time: 06/02/20  1115     History Chief Complaint  Patient presents with   covid symptoms    Maria [name] is a 64 y.o. female.  Patient brought into the emergency department for cough.  No fever no chills she was post to get blood today.  She has chronic blood loss and gets 1 or 2 units of blood frequently.  Her GI doctor wanted her to be checked for Covid  The history is provided by the patient and medical records (gastroenterologist). No language interpreter was used.  Cough Cough characteristics:  Non-productive Sputum characteristics:  Nondescript Severity:  Mild Onset quality:  Unable to specify Timing:  Constant Progression:  Waxing and waning Chronicity:  New Smoker: no   Context: not animal exposure   Relieved by:  Nothing Worsened by:  Nothing Associated symptoms: no chest pain, no eye discharge, no headaches and no rash        Past Medical History:  Diagnosis Date   Anemia    CHF (congestive heart failure) (HCC)    Cirrhosis ([place])    Depression    Diabetes mellitus    Esophageal bleed, non-variceal    Eye hemorrhage    Behind left eye   Fibromyalgia    Hypercholesteremia    Hypertension    Hypothyroidism    NAFLD (nonalcoholic fatty liver disease)    Osteoarthrosis    Osteoporosis    PONV (postoperative nausea and vomiting)    UTI (urinary tract infection) 11/12 end of the month   Patient feels that she passed a kidney stone at that time    Patient Active Problem List   Diagnosis Date Noted   Acute anemia 09/29/2019   Hepatic encephalopathy ([place]) 08/27/2019   Altered mental status 02/27/2019   Bacteriuria 02/27/2019   Acute on chronic anemia 02/24/2019   Acute hepatic encephalopathy 07/26/2018   Obesity, Class III, BMI 40-49.9 (morbid obesity) ([place])    Melena    Anemia 07/02/2018   UGI bleed 11/09/2017   Insulin dependent  diabetes mellitus    Absolute anemia 09/13/2017   Idiopathic esophageal varices with bleeding ([place]) 09/13/2017   Other cirrhosis of liver ([place]) 08/22/2017   NAFLD (nonalcoholic fatty liver disease) 08/14/2017   Encephalopathy, hepatic ([place]) 54/65/6812   Metabolic encephalopathy 75/17/0017   Hyperammonemia ([place]) 07/18/2017   Hypothyroidism 07/18/2017   Acute on chronic right-sided congestive heart failure ([place]) 07/06/2017   Pulmonary hypertension ([place]) 07/06/2017   GAVE (gastric antral vascular ectasia) 07/06/2017   GI bleeding 07/06/2017   Symptomatic anemia 07/04/2017   Hypokalemia 07/04/2017   Chronic diastolic heart failure ([place]) 06/11/2017   CAD (coronary artery disease) 06/11/2017   Nocturnal hypoxemia 02/22/2017   Idiopathic esophageal varices without bleeding ([place]) 01/31/2017   Dyspnea and respiratory abnormalities 01/25/2017   Esophageal varices in cirrhosis ([place]) 05/19/2016   Hepatic cirrhosis ([place]) 05/19/2016   Esophageal varices ([place]) 12/09/2012   Dysphagia, unspecified(787.20) 12/09/2012   Anxiety 08/26/2012   Cirrhosis, nonalcoholic ([place]) 49/44/9675   Diabetes mellitus ([place]) 10/09/2011   History of esophageal varices 10/09/2011   Iron deficiency anemia 10/09/2011   GERD (gastroesophageal reflux disease) 10/09/2011   Thrombocytopenia ([place]) 10/09/2011    Past Surgical History:  Procedure Laterality Date   APPENDECTOMY  1980   BILATERAL SALPINGOOPHORECTOMY     CHOLECYSTECTOMY     COLONOSCOPY  03/15/2011   COLONOSCOPY N/A 03/24/2016   Procedure: COLONOSCOPY;  Surgeon: Mechele Dawley  Laural Golden, MD;  [facility];  Service: Endoscopy;  Laterality: N/A;  855   ESOPHAGEAL BANDING  04/24/2012   Procedure: ESOPHAGEAL BANDING;  Surgeon: [name], MD;  Location[facility];  Service: Endoscopy;  Laterality: N/A;   Esophageal BANDING  08/03/2012   [facility] in [place] , [place]  09/24/2012    Procedure: ESOPHAGEAL BANDING;  Surgeon: [name], MD;  Location: [facility] ORS;  Service: Endoscopy;  Laterality: N/A;  Banding x 3   ESOPHAGEAL BANDING N/A 01/29/2013   Procedure: ESOPHAGEAL BANDING;  Surgeon: [name], MD;  [facility];  Service: Endoscopy;  Laterality: N/A;   ESOPHAGEAL BANDING N/A 06/19/2014   Procedure: ESOPHAGEAL BANDING;  Surgeon: [name], MD;  [facility];  Service: Endoscopy;  Laterality: N/A;   ESOPHAGEAL BANDING N/A 01/05/2016   Procedure: ESOPHAGEAL BANDING;  Surgeon: [name], MD;  [facility];  Service: Endoscopy;  Laterality: N/A;   ESOPHAGEAL BANDING N/A 08/03/2016   Procedure: ESOPHAGEAL BANDING;  Surgeon: [name], MD;  [facility];  Service: Endoscopy;  Laterality: N/A;   ESOPHAGEAL BANDING N/A 05/02/2017   Procedure: ESOPHAGEAL BANDING;  Surgeon: [name], MD;  [facility];  Service: Endoscopy;  Laterality: N/A;   ESOPHAGOGASTRODUODENOSCOPY  04/24/2012   Procedure: ESOPHAGOGASTRODUODENOSCOPY (EGD);  Surgeon: [name], MD;  [facility];  Service: Endoscopy;  Laterality: N/A;  300   ESOPHAGOGASTRODUODENOSCOPY N/A 01/29/2013   Procedure: ESOPHAGOGASTRODUODENOSCOPY (EGD);  Surgeon: [name], MD;  [facility];  Service: Endoscopy;  Laterality: N/A;  1200   ESOPHAGOGASTRODUODENOSCOPY N/A 05/22/2013   Procedure: ESOPHAGOGASTRODUODENOSCOPY (EGD);  Surgeon: [name], MD;  [facility];  Service: Endoscopy;  Laterality: N/A;  1:55   ESOPHAGOGASTRODUODENOSCOPY N/A 12/31/2013   Procedure: ESOPHAGOGASTRODUODENOSCOPY (EGD);  Surgeon: [name], MD;  [facility];  Service: Endoscopy;  Laterality: N/A;  1200   ESOPHAGOGASTRODUODENOSCOPY N/A 06/19/2014   Procedure: ESOPHAGOGASTRODUODENOSCOPY (EGD);  Surgeon: [name], MD;  [facility];  Service: Endoscopy;  Laterality: N/A;  1055    ESOPHAGOGASTRODUODENOSCOPY N/A 01/15/2015   Procedure: ESOPHAGOGASTRODUODENOSCOPY (EGD);  Surgeon: [name], MD;  [facility];  Service: Endoscopy;  Laterality: N/A;  730 - moved to 9:45 - moved to 1250-Ann notified pt   ESOPHAGOGASTRODUODENOSCOPY N/A 06/30/2015   Procedure: ESOPHAGOGASTRODUODENOSCOPY (EGD);  Surgeon: [name], MD;  [facility];  Service: Endoscopy;  Laterality: N/A;  1200   ESOPHAGOGASTRODUODENOSCOPY N/A 01/05/2016   Procedure: ESOPHAGOGASTRODUODENOSCOPY (EGD);  Surgeon: [name], MD;  [facility];  Service: Endoscopy;  Laterality: N/A;  830   ESOPHAGOGASTRODUODENOSCOPY N/A 08/03/2016   Procedure: ESOPHAGOGASTRODUODENOSCOPY (EGD);  Surgeon: [name], MD;  [facility];  Service: Endoscopy;  Laterality: N/A;  930   ESOPHAGOGASTRODUODENOSCOPY N/A 05/02/2017   Procedure: ESOPHAGOGASTRODUODENOSCOPY (EGD);  Surgeon: [name], MD;  [facility];  Service: Endoscopy;  Laterality: N/A;  12:00   ESOPHAGOGASTRODUODENOSCOPY N/A 08/17/2017   Procedure: ESOPHAGOGASTRODUODENOSCOPY (EGD);  Surgeon: [name], MD;  [facility];  Service: Endoscopy;  Laterality: N/A;  8:15   ESOPHAGOGASTRODUODENOSCOPY N/A 09/12/2017   Procedure: ESOPHAGOGASTRODUODENOSCOPY (EGD);  Surgeon: [name], MD;  [facility];  Service: Endoscopy;  Laterality: N/A;  1040   ESOPHAGOGASTRODUODENOSCOPY N/A 10/10/2017   Procedure: ESOPHAGOGASTRODUODENOSCOPY (EGD);  Surgeon: [name], MD;  [facility];  Service: Endoscopy;  Laterality: N/A;  225   ESOPHAGOGASTRODUODENOSCOPY N/A 11/09/2017   Procedure: ESOPHAGOGASTRODUODENOSCOPY (EGD);  Surgeon: [name], MD;  [facility];  Service: Endoscopy;  Laterality: N/A;   ESOPHAGOGASTRODUODENOSCOPY (EGD) WITH PROPOFOL  09/24/2012   Procedure: ESOPHAGOGASTRODUODENOSCOPY (EGD) WITH PROPOFOL;  Surgeon: [name],  MD;  Location: [facility];  Service: Endoscopy;  Laterality: N/A;  GE junction at 36   ESOPHAGOGASTRODUODENOSCOPY (EGD) WITH PROPOFOL N/A 07/06/2017   Procedure: ESOPHAGOGASTRODUODENOSCOPY (EGD) WITH PROPOFOL;  Surgeon: [name], MD;  [facility];  Service: Endoscopy;  Laterality: N/A;   ESOPHAGOGASTRODUODENOSCOPY W/ BANDING  08/2010   GIVENS CAPSULE STUDY N/A 07/05/2017   Procedure: GIVENS CAPSULE STUDY;  Surgeon: [name], MD;  [facility];  Service: Endoscopy;  Laterality: N/A;   HOT HEMOSTASIS N/A 08/17/2017   Procedure: HOT HEMOSTASIS (ARGON PLASMA COAGULATION/BICAP);  Surgeon: [name], MD;  [facility];  Service: Endoscopy;  Laterality: N/A;   HOT HEMOSTASIS  09/12/2017   Procedure: HOT HEMOSTASIS (ARGON PLASMA COAGULATION/BICAP);  Surgeon: [name], MD;  [facility];  Service: Endoscopy;;  gastric   HOT HEMOSTASIS  10/10/2017   Procedure: HOT HEMOSTASIS (ARGON PLASMA COAGULATION/BICAP);  Surgeon: [name], MD;  [facility];  Service: Endoscopy;;   POLYPECTOMY  03/24/2016   Procedure: POLYPECTOMY;  Surgeon: [name], MD;  [facility];  Service: Endoscopy;;  sigmoid polyp   PORTACATH PLACEMENT Right 06/02/2019   Procedure: INSERTION PORT-A-CATH;  Surgeon: Aviva Signs, MD;  Location: [facility];  Service: General;  Laterality: Right;   RIGHT HEART CATH N/A 04/16/2017   Procedure: Right Heart Cath;  Surgeon: [name], MD;  Location: [facility];  Service: Cardiovascular;  Laterality: N/A;   RIGHT HEART CATH N/A 07/12/2017   Procedure: RIGHT HEART CATH;  Surgeon: [name], MD;  Location: [facility];  Service: Cardiovascular;  Laterality: N/A;   TONSILLECTOMY     UPPER GASTROINTESTINAL ENDOSCOPY  03/15/2011   EGD ED BANDING/TCS   UPPER GASTROINTESTINAL ENDOSCOPY  09/05/2010   UPPER GASTROINTESTINAL ENDOSCOPY  08/11/2010   VAGINAL  HYSTERECTOMY       OB History   No obstetric history on file.     Family History  Problem Relation Age of Onset   Lung cancer Mother    Diabetes Father    Diabetes Sister    Hypertension Sister    Hypothyroidism Sister    Colon cancer Brother    Diabetes Sister    Hypothyroidism Brother    Healthy Daughter    Obesity Daughter    Hypertension Daughter     Social History   Tobacco Use   Smoking status: Never Smoker   Smokeless tobacco: Never Used  Scientific laboratory technician Use: Never used  Substance Use Topics   Alcohol use: No    Alcohol/week: 0.0 standard drinks   Drug use: No    Home Medications Prior to Admission medications   Medication Sig Start Date End Date Taking? Authorizing Provider  bumetanide (BUMEX) 2 MG tablet Take 2 mg by mouth 2 (two) times daily.  09/10/19  Yes Rehman, Mechele Dawley, MD  busPIRone (BUSPAR) 10 MG tablet Take 10 mg by mouth 2 (two) times daily.   Yes [provider]  Cholecalciferol 25 MCG (1000 UT) tablet Take 1,000 Units by  mouth every other day.    Yes [provider]  HUMALOG 100 UNIT/ML injection Inject 15-35 Units into the skin 3 (three) times daily with meals. Sliding scale insulin 100-150= 15 units 150-200= 20 units 250-300= 30 units 300-350= 35 units 07/01/18  Yes [provider]  Insulin Glargine (LANTUS SOLOSTAR) 100 UNIT/ML Solostar Pen Inject 10-60 Units into the skin See admin instructions. Inject 55 units subcutaneously in the morning & 10 units subcutaneously  if needed, in addition 03/06/19  Yes [provider]  lactulose (CHRONULAC) 10 GM/15ML solution Take 60 mLs by mouth in the morning, at noon, in the evening, and at bedtime. *If having watery stools more than 3 times daily, reduce to taking 3 times daily 02/25/20  Yes [provider]  levothyroxine (SYNTHROID, LEVOTHROID) 25 MCG tablet Take 25 mcg by mouth daily before breakfast.    Yes [provider]    lidocaine-prilocaine (EMLA) cream Apply 1 application topically daily as needed (applied to port).  08/19/19  Yes [provider]  magnesium oxide (MAG-OX) 400 MG tablet Take 400 mg by mouth at bedtime.    Yes [provider]  Pediatric Multivitamins-Iron (FLINTSTONES PLUS IRON PO) Take 2 tablets by mouth daily.   Yes [provider]  potassium chloride SA (KLOR-CON) 20 MEQ tablet Take 2 tablets (40 mEq total) by mouth daily. Patient taking differently: Take 20 mEq by mouth 2 (two) times daily.  09/02/19  Yes Enzo Bi, MD  rifaximin (XIFAXAN) 550 MG TABS tablet Take 1 tablet (550 mg total) by mouth 2 (two) times daily. 02/27/19  Yes Barton Dubois, MD  rosuvastatin (CRESTOR) 10 MG tablet Take 10 mg by mouth daily. 09/10/19  Yes [provider]  sertraline (ZOLOFT) 50 MG tablet Take 50 mg by mouth daily.  03/10/19  Yes [provider]  trimethoprim-polymyxin b (POLYTRIM) ophthalmic solution Place 1 drop into the left eye See admin instructions. Use 3 to 4 times daily for 2 days following monthly eye injection 05/06/18  Yes [provider]  zinc sulfate 220 (50 Zn) MG capsule Take 1 capsule (220 mg total) by mouth daily. 08/01/18  Yes Barton Dubois, MD  spironolactone (ALDACTONE) 100 MG tablet Take 1 tablet (100 mg total) by mouth daily. 10/06/19 03/30/20  Manuella Ghazi, Pratik D, DO    Allergies    Ferumoxytol and Tramadol hcl  Review of Systems   Review of Systems  Constitutional: Negative for appetite change and fatigue.  HENT: Negative for congestion, ear discharge and sinus pressure.   Eyes: Negative for discharge.  Respiratory: Positive for cough.   Cardiovascular: Negative for chest pain.  Gastrointestinal: Negative for abdominal pain and diarrhea.  Genitourinary: Negative for frequency and hematuria.  Musculoskeletal: Negative for back pain.  Skin: Negative for rash.  Neurological: Negative for seizures and headaches.   Psychiatric/Behavioral: Negative for hallucinations.    Physical Exam Updated Vital Signs Ht 5' 3"  (1.6 m)    Wt 86 kg    BMI 33.59 kg/m   Physical Exam Vitals and nursing note reviewed.  Constitutional:      Appearance: She is well-developed.  HENT:     Head: Normocephalic.     Nose: Nose normal.  Eyes:     General: No scleral icterus.    Conjunctiva/sclera: Conjunctivae normal.  Neck:     Thyroid: No thyromegaly.  Cardiovascular:     Rate and Rhythm: Normal rate and regular rhythm.     Heart sounds: No murmur heard.  No  friction rub. No gallop.   Pulmonary:     Breath sounds: No stridor. No wheezing or rales.  Chest:     Chest wall: No tenderness.  Abdominal:     General: There is no distension.     Tenderness: There is no abdominal tenderness. There is no rebound.  Musculoskeletal:        General: Normal range of motion.     Cervical back: Neck supple.  Lymphadenopathy:     Cervical: No cervical adenopathy.  Skin:    Findings: No erythema or rash.  Neurological:     Mental Status: She is oriented to person, place, and time.     Motor: No abnormal muscle tone.     Coordination: Coordination normal.  Psychiatric:        Behavior: Behavior normal.     ED Results / Procedures / Treatments   Labs (all labs ordered are listed, but only abnormal results are displayed) Labs Reviewed  SARS CORONAVIRUS 2 BY RT PCR (HOSPITAL ORDER, [facility])    EKG None  Radiology DG Chest Port 1 View  Result Date: 06/02/2020 CLINICAL DATA:  Cough over the last week. Scheduled for blood transfusion. EXAM: PORTABLE CHEST 1 VIEW COMPARISON:  10/03/2019 FINDINGS: Power port in place on the right with tip in the SVC at the azygos level. Chronic cardiomegaly. Chronic vascular plethora but without infiltrate, collapse or effusion. No significant bone finding. IMPRESSION: Chronic cardiomegaly and vascular plethora. No infiltrate, collapse or effusion.  Electronically Signed   By: Nelson Chimes M.D.   On: 06/02/2020 11:51    Procedures Procedures (including critical care time)  Medications Ordered in ED Medications - No data to display  ED Course  I have reviewed the triage vital signs and the nursing notes.  Pertinent labs & imaging results that were available during my care of the patient were reviewed by me and considered in my medical decision making (see chart for details).    MDM Rules/Calculators/A&P                         Pt with a cough and neg covid.   cxr neg.  Follow up with pcp       This patient presents to the ED for concern of cough, this involves an extensive number of treatment options, and is a complaint that carries with it a high risk of complications and morbidity.  The differential diagnosis includes bronchitis pneumonia   Lab Tests:   I Ordered, reviewed, and interpreted labs, which included Covid negative  Medicines ordered:     Imaging Studies ordered:   I ordered imaging studies which included chest x-ray  I independently visualized and interpreted imaging which showed no acute disease  Additional history obtained:   Additional history obtained from her physician  Previous records obtained and reviewed negative  Consultations Obtained:     Reevaluation:  After the interventions stated above, I reevaluated the patient and found no change  Critical Interventions:      Final Clinical Impression(s) / ED Diagnoses Final diagnoses:  Cough    Rx / DC Orders ED Discharge Orders    None       Milton Ferguson, MD 06/02/20 1305

## 2020-06-02 NOTE — ED Triage Notes (Signed)
Pt brought over from day surgery.  Reports was scheduled to have a blood transfusion and told the nurse she has had a cough x 1 week and not able to taste or smell anything.  Also reports some intermittent chest pain.  Denies chest pain at this time.  Dr. Laural Golden spoke to Dr. Roderic Palau about pt.

## 2020-06-02 NOTE — Progress Notes (Signed)
Patient has labored breathing, which is chronic from her condition and anemia.  2 liters of O2 placed for comfort. After assessing patient she states that she has had and is having intermittent chest discomfort, associated with a cough and loss of taste x 1 week. Due to the nature of the complaints, Dr. Laural Golden was consulted.  Advised not to start transfusion and take patient to the ER due to her history and multi system disease process.  States that he will contact ED physician for plan to treat.  Brief report given to Magda Paganini, RN in the ER and assigned room 18.  Blood bank notified to hold blood till further notice and to please make ER aware that is was available if the physician wished to continue with infusion.  Husband Ronalee Belts advised of above situation and plan.  Patient taken to room 18 by wheelchair.  Port remains accessed.

## 2020-06-02 NOTE — Discharge Instructions (Addendum)
Dr. Laural Golden will contact you about getting blood soon

## 2020-06-03 ENCOUNTER — Encounter (HOSPITAL_COMMUNITY): Payer: Self-pay

## 2020-06-03 ENCOUNTER — Encounter (HOSPITAL_COMMUNITY)
Admission: RE | Admit: 2020-06-03 | Discharge: 2020-06-03 | Disposition: A | Discharge status: Home / Self Care | Payer: PPO | Source: Ambulatory Visit | Attending: Internal Medicine | Admitting: Internal Medicine

## 2020-06-03 DIAGNOSIS — K7581 Nonalcoholic steatohepatitis (NASH): Secondary | ICD-10-CM | POA: Insufficient documentation

## 2020-06-03 LAB — AMMONIA: Ammonia: 90 umol/L — ABNORMAL HIGH (ref 9–35)

## 2020-06-03 MED ORDER — ACETAMINOPHEN 325 MG PO TABS
650.0000 mg | ORAL_TABLET | Freq: Once | ORAL | Status: AC
  Administered 2020-06-03: 650 mg via ORAL

## 2020-06-03 MED ORDER — DIPHENHYDRAMINE HCL 25 MG PO CAPS
25.0000 mg | ORAL_CAPSULE | Freq: Once | ORAL | Status: AC
  Administered 2020-06-03: 25 mg via ORAL

## 2020-06-03 MED ORDER — SODIUM CHLORIDE 0.9 % IV SOLN: Freq: Once | INTRAVENOUS | Status: AC

## 2020-06-03 MED ORDER — HEPARIN SOD (PORK) LOCK FLUSH 100 UNIT/ML IV SOLN
500.0000 [IU] | Freq: Once | INTRAVENOUS | Status: AC
  Administered 2020-06-03: 500 [IU] via INTRAVENOUS

## 2020-06-03 MED ORDER — FUROSEMIDE 10 MG/ML IJ SOLN
40.0000 mg | Freq: Once | INTRAMUSCULAR | Status: AC
  Administered 2020-06-03: 40 mg via INTRAVENOUS

## 2020-06-04 ENCOUNTER — Encounter (INDEPENDENT_AMBULATORY_CARE_PROVIDER_SITE_OTHER): Payer: PPO | Admitting: Ophthalmology

## 2020-06-04 ENCOUNTER — Other Ambulatory Visit: Payer: Self-pay

## 2020-06-04 DIAGNOSIS — E11311 Type 2 diabetes mellitus with unspecified diabetic retinopathy with macular edema: Secondary | ICD-10-CM

## 2020-06-04 DIAGNOSIS — E113291 Type 2 diabetes mellitus with mild nonproliferative diabetic retinopathy without macular edema, right eye: Secondary | ICD-10-CM | POA: Diagnosis not present

## 2020-06-04 DIAGNOSIS — H35033 Hypertensive retinopathy, bilateral: Secondary | ICD-10-CM | POA: Diagnosis not present

## 2020-06-04 DIAGNOSIS — E113312 Type 2 diabetes mellitus with moderate nonproliferative diabetic retinopathy with macular edema, left eye: Secondary | ICD-10-CM

## 2020-06-04 DIAGNOSIS — H43813 Vitreous degeneration, bilateral: Secondary | ICD-10-CM

## 2020-06-04 DIAGNOSIS — I10 Essential (primary) hypertension: Secondary | ICD-10-CM | POA: Diagnosis not present

## 2020-06-04 LAB — BPAM RBC
Blood Product Expiration Date: 202110122359
Blood Product Expiration Date: 202110132359
ISSUE DATE / TIME: 202109160957
ISSUE DATE / TIME: 202109161135
Unit Type and Rh: 9500
Unit Type and Rh: 9500

## 2020-06-04 LAB — TYPE AND SCREEN
ABO/RH(D): O NEG
Antibody Screen: NEGATIVE
Unit division: 0
Unit division: 0

## 2020-06-04 NOTE — Unmapped (Signed)
VM received from Marilyn French inquiring into scheduling of EGD and requesting assistance with medication refills  they have not had for two weeks.    Call placed and spoke with staff @ [facility] - egd scheduled for 10/1 with Dr. Ruffin Frederick.     Call placed and unable to leave VM for Marilyn French.    Call placed and VM left for Marilyn French making her aware of EGD date and requesting further information regarding medication needs (names etc).

## 2020-06-07 ENCOUNTER — Encounter (HOSPITAL_COMMUNITY): Payer: PPO

## 2020-06-07 ENCOUNTER — Telehealth (INDEPENDENT_AMBULATORY_CARE_PROVIDER_SITE_OTHER): Payer: Self-pay | Admitting: Internal Medicine

## 2020-06-07 DIAGNOSIS — K7290 Hepatic failure, unspecified without coma: Principal | ICD-10-CM

## 2020-06-07 MED ORDER — RIFAXIMIN 550 MG TABLET
ORAL_TABLET | Freq: Two times a day (BID) | ORAL | 11 refills | 30 days | Status: CP
Start: 2020-06-07 — End: ?

## 2020-06-07 NOTE — Telephone Encounter (Signed)
Patient called wanted [name] to know that both her feet are really swollen and her weight has increased - please advise - ph# [phone]

## 2020-06-07 NOTE — Telephone Encounter (Signed)
I am routing this message to Dr. Laural Golden to discuss this issue with him in the office on Tuesday

## 2020-06-08 ENCOUNTER — Other Ambulatory Visit (INDEPENDENT_AMBULATORY_CARE_PROVIDER_SITE_OTHER): Payer: Self-pay | Admitting: Internal Medicine

## 2020-06-08 MED ORDER — METOLAZONE 2.5 MG PO TABS
2.5000 mg | ORAL_TABLET | ORAL | 0 refills | Status: DC | PRN
Start: 2020-06-08 — End: 2020-07-19

## 2020-06-08 NOTE — Telephone Encounter (Signed)
This patient has called again about previous message

## 2020-06-08 NOTE — Telephone Encounter (Signed)
Patient says she has gained at least 10 pounds this month. She is on Bumex 2 mg twice daily and spironolactone 100 mg daily. Patient reminded that she needs to be on low-salt diet. Metolazone 2.5 mg today and tomorrow and thereafter every other day as needed prescription sent to [facility] for 10 doses. Patient advised to call office with progress report in 3 days.   Patient told me that her husband had MI and was airlifted to [facility] and had coronary stenting yesterday.

## 2020-06-08 NOTE — Unmapped (Signed)
Medication Refill for Xifaxan    Reached out to pt to let her know that Xifaxan should be refilled through Manufacturer Patient Assistance Program at [phone]. Advised she is approved til 09/17/20 so whenever she needs refill, she can contact manufacturer to order refills. Advised will need to renew this towards end of the year. Also spoke with pt's daughter, Jacki Cones per pt's request and reviewed refill process with manufacturer. Both verbalized understanding.    Park Breed, Pharm D., BCPS, BCGP, CPP  [facility]  [address]  [place], [place] [address]  [phone]

## 2020-06-11 ENCOUNTER — Telehealth (INDEPENDENT_AMBULATORY_CARE_PROVIDER_SITE_OTHER): Payer: Self-pay

## 2020-06-11 NOTE — Telephone Encounter (Signed)
I called 06/11/2020 to follow up with the patient. No answer.CLS 06/11/2020

## 2020-06-11 NOTE — Telephone Encounter (Signed)
-----   Message from Johnston Ebbs sent at 06/08/2020  2:52 PM EDT ----- Dr. Laural Golden had put in [name]'s basket by mistake   Patient says she has gained at least 10 pounds this month. She is on Bumex 2 mg twice daily and spironolactone 100 mg daily. Patient reminded that she needs to be on low-salt diet. Metolazone 2.5 mg today and tomorrow and thereafter every other day as needed prescription sent to [facility] for 10 doses. Patient advised to call office with progress report in 3 days.   Patient told me that her husband had MI and was airlifted to [facility] and had coronary stenting yesterday

## 2020-06-11 NOTE — Telephone Encounter (Signed)
I called to follow up with the patient her voice mail is not set up.

## 2020-06-11 NOTE — Telephone Encounter (Signed)
I Called patient and vm not set up.

## 2020-06-14 ENCOUNTER — Other Ambulatory Visit (INDEPENDENT_AMBULATORY_CARE_PROVIDER_SITE_OTHER): Payer: Self-pay | Admitting: *Deleted

## 2020-06-14 ENCOUNTER — Other Ambulatory Visit: Payer: Self-pay

## 2020-06-14 ENCOUNTER — Encounter (HOSPITAL_COMMUNITY)
Admission: RE | Admit: 2020-06-14 | Discharge: 2020-06-14 | Disposition: A | Discharge status: Home / Self Care | Payer: PPO | Source: Ambulatory Visit | Attending: Internal Medicine | Admitting: Internal Medicine

## 2020-06-14 DIAGNOSIS — K7581 Nonalcoholic steatohepatitis (NASH): Secondary | ICD-10-CM | POA: Diagnosis not present

## 2020-06-14 DIAGNOSIS — K7460 Unspecified cirrhosis of liver: Secondary | ICD-10-CM

## 2020-06-14 DIAGNOSIS — K7682 Hepatic encephalopathy: Secondary | ICD-10-CM

## 2020-06-14 DIAGNOSIS — K7290 Hepatic failure, unspecified without coma: Secondary | ICD-10-CM

## 2020-06-14 LAB — HEMOGLOBIN AND HEMATOCRIT, BLOOD
HCT: 21.6 % — ABNORMAL LOW (ref 36.0–46.0)
Hemoglobin: 6.5 g/dL — CL (ref 12.0–15.0)

## 2020-06-14 LAB — SAMPLE TO BLOOD BANK

## 2020-06-14 MED ORDER — HEPARIN SOD (PORK) LOCK FLUSH 100 UNIT/ML IV SOLN
500.0000 [IU] | Freq: Once | INTRAVENOUS | Status: AC
  Administered 2020-06-14: 500 [IU] via INTRAVENOUS

## 2020-06-14 NOTE — Telephone Encounter (Signed)
I spoke with the patients husband and he states the patient is doing a lot better with the edema and has lost the ten pounds she had gained last week. Husband states he is doing well also.

## 2020-06-14 NOTE — Telephone Encounter (Signed)
Talked with Dr.Rehman - He shares that this has been taken care, addressed with the patient.

## 2020-06-16 ENCOUNTER — Encounter (HOSPITAL_COMMUNITY): Payer: Self-pay

## 2020-06-16 ENCOUNTER — Encounter (HOSPITAL_COMMUNITY)
Admission: RE | Admit: 2020-06-16 | Discharge: 2020-06-16 | Disposition: A | Discharge status: Home / Self Care | Payer: PPO | Source: Ambulatory Visit | Attending: Internal Medicine | Admitting: Internal Medicine

## 2020-06-16 ENCOUNTER — Other Ambulatory Visit: Payer: Self-pay

## 2020-06-16 DIAGNOSIS — K7581 Nonalcoholic steatohepatitis (NASH): Secondary | ICD-10-CM | POA: Diagnosis not present

## 2020-06-16 LAB — COMPREHENSIVE METABOLIC PANEL
ALT: 26 U/L (ref 0–44)
AST: 37 U/L (ref 15–41)
Albumin: 1.9 g/dL — ABNORMAL LOW (ref 3.5–5.0)
Alkaline Phosphatase: 79 U/L (ref 38–126)
Anion gap: 6 (ref 5–15)
BUN: 32 mg/dL — ABNORMAL HIGH (ref 8–23)
CO2: 23 mmol/L (ref 22–32)
Calcium: 7.4 mg/dL — ABNORMAL LOW (ref 8.9–10.3)
Chloride: 102 mmol/L (ref 98–111)
Creatinine, Ser: 1.3 mg/dL — ABNORMAL HIGH (ref 0.44–1.00)
GFR calc Af Amer: 50 mL/min — ABNORMAL LOW (ref >60)
GFR calc non Af Amer: 43 mL/min — ABNORMAL LOW (ref >60)
Glucose, Bld: 248 mg/dL — ABNORMAL HIGH (ref 70–99)
Potassium: 3.6 mmol/L (ref 3.5–5.1)
Sodium: 131 mmol/L — ABNORMAL LOW (ref 135–145)
Total Bilirubin: 3.1 mg/dL — ABNORMAL HIGH (ref 0.3–1.2)
Total Protein: 4.2 g/dL — ABNORMAL LOW (ref 6.5–8.1)

## 2020-06-16 LAB — PREPARE RBC (CROSSMATCH)

## 2020-06-16 LAB — AMMONIA: Ammonia: 64 umol/L — ABNORMAL HIGH (ref 9–35)

## 2020-06-16 MED ORDER — FUROSEMIDE 10 MG/ML IJ SOLN
20.0000 mg | Freq: Once | INTRAMUSCULAR | Status: AC
  Administered 2020-06-16: 20 mg via INTRAVENOUS

## 2020-06-16 MED ORDER — SODIUM CHLORIDE 0.9% IV SOLUTION: Freq: Once | INTRAVENOUS | Status: AC

## 2020-06-16 MED ORDER — DIPHENHYDRAMINE HCL 25 MG PO CAPS
25.0000 mg | ORAL_CAPSULE | Freq: Once | ORAL | Status: AC
  Administered 2020-06-16: 25 mg via ORAL

## 2020-06-16 MED ORDER — FUROSEMIDE 10 MG/ML IJ SOLN
INTRAMUSCULAR | Status: AC
  Filled 2020-06-16: qty 4

## 2020-06-16 MED ORDER — HEPARIN SOD (PORK) LOCK FLUSH 100 UNIT/ML IV SOLN
500.0000 [IU] | Freq: Once | INTRAVENOUS | Status: AC
  Administered 2020-06-16: 500 [IU] via INTRAVENOUS

## 2020-06-16 MED ORDER — ACETAMINOPHEN 325 MG PO TABS
650.0000 mg | ORAL_TABLET | Freq: Once | ORAL | Status: AC
  Administered 2020-06-16: 650 mg via ORAL

## 2020-06-17 DIAGNOSIS — E119 Type 2 diabetes mellitus without complications: Secondary | ICD-10-CM | POA: Diagnosis not present

## 2020-06-17 DIAGNOSIS — I10 Essential (primary) hypertension: Secondary | ICD-10-CM | POA: Diagnosis not present

## 2020-06-17 LAB — TYPE AND SCREEN
ABO/RH(D): O NEG
Antibody Screen: NEGATIVE
Unit division: 0
Unit division: 0

## 2020-06-17 LAB — BPAM RBC
Blood Product Expiration Date: 202110152359
Blood Product Expiration Date: 202110152359
ISSUE DATE / TIME: 202109290949
ISSUE DATE / TIME: 202109291145
Unit Type and Rh: 9500
Unit Type and Rh: 9500

## 2020-06-18 ENCOUNTER — Telehealth (INDEPENDENT_AMBULATORY_CARE_PROVIDER_SITE_OTHER): Payer: Self-pay | Admitting: *Deleted

## 2020-06-18 ENCOUNTER — Ambulatory Visit: Admit: 2020-06-18 | Discharge: 2020-06-18 | Payer: PRIVATE HEALTH INSURANCE

## 2020-06-18 ENCOUNTER — Encounter: Admit: 2020-06-18 | Discharge: 2020-06-18 | Payer: PRIVATE HEALTH INSURANCE

## 2020-06-18 DIAGNOSIS — I071 Rheumatic tricuspid insufficiency: Secondary | ICD-10-CM | POA: Diagnosis not present

## 2020-06-18 DIAGNOSIS — M797 Fibromyalgia: Secondary | ICD-10-CM | POA: Diagnosis not present

## 2020-06-18 DIAGNOSIS — E119 Type 2 diabetes mellitus without complications: Secondary | ICD-10-CM | POA: Diagnosis not present

## 2020-06-18 DIAGNOSIS — R9431 Abnormal electrocardiogram [ECG] [EKG]: Secondary | ICD-10-CM | POA: Diagnosis not present

## 2020-06-18 DIAGNOSIS — K7460 Unspecified cirrhosis of liver: Secondary | ICD-10-CM | POA: Diagnosis not present

## 2020-06-18 DIAGNOSIS — Z794 Long term (current) use of insulin: Secondary | ICD-10-CM | POA: Diagnosis not present

## 2020-06-18 DIAGNOSIS — K7210 Chronic hepatic failure without coma: Secondary | ICD-10-CM | POA: Diagnosis not present

## 2020-06-18 DIAGNOSIS — E7800 Pure hypercholesterolemia, unspecified: Secondary | ICD-10-CM | POA: Diagnosis not present

## 2020-06-18 DIAGNOSIS — R0602 Shortness of breath: Secondary | ICD-10-CM | POA: Diagnosis not present

## 2020-06-18 DIAGNOSIS — I340 Nonrheumatic mitral (valve) insufficiency: Secondary | ICD-10-CM | POA: Diagnosis not present

## 2020-06-18 DIAGNOSIS — I8500 Esophageal varices without bleeding: Secondary | ICD-10-CM | POA: Diagnosis not present

## 2020-06-18 DIAGNOSIS — R58 Hemorrhage, not elsewhere classified: Secondary | ICD-10-CM | POA: Diagnosis not present

## 2020-06-18 DIAGNOSIS — I440 Atrioventricular block, first degree: Secondary | ICD-10-CM | POA: Diagnosis not present

## 2020-06-18 DIAGNOSIS — I10 Essential (primary) hypertension: Secondary | ICD-10-CM | POA: Diagnosis not present

## 2020-06-18 DIAGNOSIS — I444 Left anterior fascicular block: Secondary | ICD-10-CM | POA: Diagnosis not present

## 2020-06-18 DIAGNOSIS — E039 Hypothyroidism, unspecified: Secondary | ICD-10-CM | POA: Diagnosis not present

## 2020-06-18 DIAGNOSIS — K766 Portal hypertension: Secondary | ICD-10-CM | POA: Diagnosis not present

## 2020-06-18 DIAGNOSIS — R188 Other ascites: Secondary | ICD-10-CM | POA: Diagnosis not present

## 2020-06-18 DIAGNOSIS — K31811 Angiodysplasia of stomach and duodenum with bleeding: Secondary | ICD-10-CM | POA: Diagnosis not present

## 2020-06-18 DIAGNOSIS — M62830 Muscle spasm of back: Secondary | ICD-10-CM | POA: Diagnosis not present

## 2020-06-18 DIAGNOSIS — M810 Age-related osteoporosis without current pathological fracture: Secondary | ICD-10-CM | POA: Diagnosis not present

## 2020-06-18 DIAGNOSIS — K3189 Other diseases of stomach and duodenum: Secondary | ICD-10-CM | POA: Diagnosis not present

## 2020-06-18 DIAGNOSIS — K760 Fatty (change of) liver, not elsewhere classified: Secondary | ICD-10-CM | POA: Diagnosis not present

## 2020-06-18 DIAGNOSIS — I272 Pulmonary hypertension, unspecified: Secondary | ICD-10-CM | POA: Diagnosis not present

## 2020-06-18 MED ADMIN — propofoL (DIPRIVAN) injection: INTRAVENOUS | @ 13:00:00 | Stop: 2020-06-18

## 2020-06-18 MED ADMIN — propofoL (DIPRIVAN) injection: INTRAVENOUS | @ 14:00:00 | Stop: 2020-06-18

## 2020-06-18 MED ADMIN — lactated Ringers infusion: 10 mL/h | INTRAVENOUS | @ 13:00:00 | Stop: 2020-06-18

## 2020-06-18 MED ADMIN — vasopressin (VASOSTRICT) injection: INTRAVENOUS | @ 14:00:00 | Stop: 2020-06-18

## 2020-06-18 MED ADMIN — propofol (DIPRIVAN) infusion 10 mg/mL: INTRAVENOUS | @ 13:00:00 | Stop: 2020-06-18

## 2020-06-18 MED ADMIN — lactated Ringers infusion: INTRAVENOUS | @ 13:00:00 | Stop: 2020-06-18

## 2020-06-18 MED ADMIN — lidocaine (XYLOCAINE) 20 mg/mL (2 %) injection: INTRAVENOUS | @ 13:00:00 | Stop: 2020-06-18

## 2020-06-18 NOTE — Telephone Encounter (Signed)
Patient had called the office on 06/16/2020 stating that she was out of the Lactulose and the pharmacy did not know who the ordering doctor was. It was a hospitalist.  Talked with the patient's retail pharmacy that is on her chart- Lactulose was called in for the patient and she will get a call from them when it is ready  Note she had stated in her message that it was needed asap - using mail order pharmacy , she would not get it asap.

## 2020-06-18 NOTE — Progress Notes (Signed)
Order complete

## 2020-06-21 ENCOUNTER — Encounter (HOSPITAL_COMMUNITY)
Admission: RE | Admit: 2020-06-21 | Discharge: 2020-06-21 | Disposition: A | Discharge status: Home / Self Care | Payer: PPO | Source: Ambulatory Visit | Attending: Internal Medicine | Admitting: Internal Medicine

## 2020-06-21 ENCOUNTER — Other Ambulatory Visit: Payer: Self-pay

## 2020-06-21 DIAGNOSIS — K7581 Nonalcoholic steatohepatitis (NASH): Secondary | ICD-10-CM | POA: Diagnosis not present

## 2020-06-21 LAB — SAMPLE TO BLOOD BANK

## 2020-06-21 LAB — HEMOGLOBIN AND HEMATOCRIT, BLOOD
HCT: 28.7 % — ABNORMAL LOW (ref 36.0–46.0)
Hemoglobin: 8.5 g/dL — ABNORMAL LOW (ref 12.0–15.0)

## 2020-06-21 MED ORDER — HEPARIN SOD (PORK) LOCK FLUSH 100 UNIT/ML IV SOLN
500.0000 [IU] | Freq: Once | INTRAVENOUS | Status: AC
  Administered 2020-06-21: 500 [IU] via INTRAVENOUS

## 2020-06-22 DIAGNOSIS — Z713 Dietary counseling and surveillance: Secondary | ICD-10-CM | POA: Diagnosis not present

## 2020-06-22 DIAGNOSIS — E7800 Pure hypercholesterolemia, unspecified: Secondary | ICD-10-CM | POA: Diagnosis not present

## 2020-06-22 DIAGNOSIS — Z0000 Encounter for general adult medical examination without abnormal findings: Secondary | ICD-10-CM | POA: Diagnosis not present

## 2020-06-22 DIAGNOSIS — Z299 Encounter for prophylactic measures, unspecified: Secondary | ICD-10-CM | POA: Diagnosis not present

## 2020-06-22 DIAGNOSIS — Z7189 Other specified counseling: Secondary | ICD-10-CM | POA: Diagnosis not present

## 2020-06-22 DIAGNOSIS — Z1339 Encounter for screening examination for other mental health and behavioral disorders: Secondary | ICD-10-CM | POA: Diagnosis not present

## 2020-06-22 DIAGNOSIS — Z79899 Other long term (current) drug therapy: Secondary | ICD-10-CM | POA: Diagnosis not present

## 2020-06-22 DIAGNOSIS — R5383 Other fatigue: Secondary | ICD-10-CM | POA: Diagnosis not present

## 2020-06-22 DIAGNOSIS — Z1331 Encounter for screening for depression: Secondary | ICD-10-CM | POA: Diagnosis not present

## 2020-06-22 DIAGNOSIS — Z6832 Body mass index (BMI) 32.0-32.9, adult: Secondary | ICD-10-CM | POA: Diagnosis not present

## 2020-06-25 MED ORDER — BUSPIRONE 10 MG TABLET
ORAL_TABLET | 0 refills | 0 days | Status: CP
Start: 2020-06-25 — End: ?

## 2020-06-25 NOTE — Unmapped (Signed)
Patient has requested a medication refill via EPIC

## 2020-06-28 ENCOUNTER — Other Ambulatory Visit: Payer: Self-pay | Admitting: General Surgery

## 2020-06-28 ENCOUNTER — Other Ambulatory Visit: Payer: Self-pay

## 2020-06-28 ENCOUNTER — Encounter (HOSPITAL_COMMUNITY)
Admission: RE | Admit: 2020-06-28 | Discharge: 2020-06-28 | Disposition: A | Discharge status: Home / Self Care | Payer: PPO | Source: Ambulatory Visit | Attending: Internal Medicine | Admitting: Internal Medicine

## 2020-06-28 ENCOUNTER — Encounter (HOSPITAL_COMMUNITY): Payer: Self-pay

## 2020-06-28 DIAGNOSIS — K7581 Nonalcoholic steatohepatitis (NASH): Secondary | ICD-10-CM | POA: Insufficient documentation

## 2020-06-28 LAB — SAMPLE TO BLOOD BANK

## 2020-06-28 LAB — HEMOGLOBIN AND HEMATOCRIT, BLOOD
HCT: 27.1 % — ABNORMAL LOW (ref 36.0–46.0)
Hemoglobin: 8.4 g/dL — ABNORMAL LOW (ref 12.0–15.0)

## 2020-06-28 MED ORDER — HEPARIN SOD (PORK) LOCK FLUSH 100 UNIT/ML IV SOLN
500.0000 [IU] | Freq: Once | INTRAVENOUS | Status: AC
  Administered 2020-06-28: 500 [IU] via INTRAVENOUS

## 2020-07-01 ENCOUNTER — Other Ambulatory Visit: Payer: Self-pay | Admitting: General Surgery

## 2020-07-05 ENCOUNTER — Other Ambulatory Visit: Payer: Self-pay

## 2020-07-05 ENCOUNTER — Encounter (HOSPITAL_COMMUNITY)
Admission: RE | Admit: 2020-07-05 | Discharge: 2020-07-05 | Disposition: A | Discharge status: Home / Self Care | Payer: PPO | Source: Ambulatory Visit | Attending: Internal Medicine | Admitting: Internal Medicine

## 2020-07-05 ENCOUNTER — Telehealth (INDEPENDENT_AMBULATORY_CARE_PROVIDER_SITE_OTHER): Payer: Self-pay

## 2020-07-05 DIAGNOSIS — K7581 Nonalcoholic steatohepatitis (NASH): Secondary | ICD-10-CM | POA: Diagnosis not present

## 2020-07-05 LAB — HEMOGLOBIN AND HEMATOCRIT, BLOOD
HCT: 22.3 % — ABNORMAL LOW (ref 36.0–46.0)
Hemoglobin: 6.7 g/dL — CL (ref 12.0–15.0)

## 2020-07-05 LAB — SAMPLE TO BLOOD BANK

## 2020-07-05 LAB — PREPARE RBC (CROSSMATCH)

## 2020-07-05 LAB — AMMONIA: Ammonia: 97 umol/L — ABNORMAL HIGH (ref 9–35)

## 2020-07-05 MED ORDER — HEPARIN SOD (PORK) LOCK FLUSH 100 UNIT/ML IV SOLN
500.0000 [IU] | Freq: Once | INTRAVENOUS | Status: AC
  Administered 2020-07-05: 500 [IU] via INTRAVENOUS

## 2020-07-05 NOTE — Telephone Encounter (Signed)
Talked with Renda Rolls RN . She is adding the Ammonia level on to the patient's lab work to be drawn at 11:30 am.

## 2020-07-05 NOTE — Telephone Encounter (Signed)
[name] is calling stating that he thinks Nancys Amonia Level is high and is asking if Dr. Laural Golden would order an Amonia Level to her labs this morning, please advise?

## 2020-07-06 ENCOUNTER — Encounter (HOSPITAL_COMMUNITY)
Admission: RE | Admit: 2020-07-06 | Discharge: 2020-07-06 | Disposition: A | Discharge status: Home / Self Care | Payer: PPO | Source: Ambulatory Visit | Attending: Internal Medicine | Admitting: Internal Medicine

## 2020-07-06 ENCOUNTER — Encounter (HOSPITAL_COMMUNITY): Payer: Self-pay

## 2020-07-06 ENCOUNTER — Other Ambulatory Visit (INDEPENDENT_AMBULATORY_CARE_PROVIDER_SITE_OTHER): Payer: Self-pay | Admitting: Internal Medicine

## 2020-07-06 ENCOUNTER — Other Ambulatory Visit: Payer: Self-pay | Admitting: Family Medicine

## 2020-07-06 DIAGNOSIS — K7460 Unspecified cirrhosis of liver: Secondary | ICD-10-CM

## 2020-07-06 DIAGNOSIS — K7581 Nonalcoholic steatohepatitis (NASH): Secondary | ICD-10-CM | POA: Diagnosis not present

## 2020-07-06 MED ORDER — SODIUM CHLORIDE 0.9% IV SOLUTION: Freq: Once | INTRAVENOUS | Status: AC

## 2020-07-06 MED ORDER — LIDOCAINE-PRILOCAINE 2.5-2.5 % EX CREA: 1.0000 "application " | TOPICAL_CREAM | Freq: Every day | CUTANEOUS | 0 refills | Status: DC | PRN

## 2020-07-06 MED ORDER — DIPHENHYDRAMINE HCL 25 MG PO CAPS
25.0000 mg | ORAL_CAPSULE | Freq: Once | ORAL | Status: AC
  Administered 2020-07-06: 25 mg via ORAL

## 2020-07-06 MED ORDER — FUROSEMIDE 10 MG/ML IJ SOLN: 40.0000 mg | Freq: Once | INTRAMUSCULAR | Status: DC

## 2020-07-06 MED ORDER — ACETAMINOPHEN 325 MG PO TABS
650.0000 mg | ORAL_TABLET | Freq: Once | ORAL | Status: AC
  Administered 2020-07-06: 650 mg via ORAL

## 2020-07-06 MED ORDER — HEPARIN SOD (PORK) LOCK FLUSH 100 UNIT/ML IV SOLN
500.0000 [IU] | Freq: Once | INTRAVENOUS | Status: AC
  Administered 2020-07-06: 500 [IU] via INTRAVENOUS

## 2020-07-07 LAB — TYPE AND SCREEN
ABO/RH(D): O NEG
Antibody Screen: NEGATIVE
Unit division: 0
Unit division: 0

## 2020-07-07 LAB — BPAM RBC
Blood Product Expiration Date: 202111182359
Blood Product Expiration Date: 202111192359
ISSUE DATE / TIME: 202110190922
ISSUE DATE / TIME: 202110191129
Unit Type and Rh: 9500
Unit Type and Rh: 9500

## 2020-07-11 ENCOUNTER — Ambulatory Visit
Admit: 2020-07-11 | Discharge: 2020-07-14 | Disposition: A | Discharge status: Home / Self Care | Payer: PRIVATE HEALTH INSURANCE

## 2020-07-11 DIAGNOSIS — R4182 Altered mental status, unspecified: Secondary | ICD-10-CM | POA: Diagnosis not present

## 2020-07-11 DIAGNOSIS — K3189 Other diseases of stomach and duodenum: Secondary | ICD-10-CM | POA: Diagnosis not present

## 2020-07-11 DIAGNOSIS — K31819 Angiodysplasia of stomach and duodenum without bleeding: Secondary | ICD-10-CM | POA: Diagnosis not present

## 2020-07-11 DIAGNOSIS — K7581 Nonalcoholic steatohepatitis (NASH): Secondary | ICD-10-CM | POA: Diagnosis not present

## 2020-07-11 DIAGNOSIS — Z794 Long term (current) use of insulin: Secondary | ICD-10-CM | POA: Diagnosis not present

## 2020-07-11 DIAGNOSIS — K760 Fatty (change of) liver, not elsewhere classified: Secondary | ICD-10-CM | POA: Diagnosis not present

## 2020-07-11 DIAGNOSIS — E039 Hypothyroidism, unspecified: Secondary | ICD-10-CM | POA: Diagnosis not present

## 2020-07-11 DIAGNOSIS — R188 Other ascites: Secondary | ICD-10-CM | POA: Diagnosis not present

## 2020-07-11 DIAGNOSIS — Z9049 Acquired absence of other specified parts of digestive tract: Secondary | ICD-10-CM | POA: Diagnosis not present

## 2020-07-11 DIAGNOSIS — R410 Disorientation, unspecified: Secondary | ICD-10-CM | POA: Diagnosis not present

## 2020-07-11 DIAGNOSIS — R7989 Other specified abnormal findings of blood chemistry: Secondary | ICD-10-CM | POA: Diagnosis not present

## 2020-07-11 DIAGNOSIS — K7290 Hepatic failure, unspecified without coma: Secondary | ICD-10-CM | POA: Diagnosis not present

## 2020-07-11 DIAGNOSIS — E785 Hyperlipidemia, unspecified: Secondary | ICD-10-CM | POA: Diagnosis not present

## 2020-07-11 DIAGNOSIS — R531 Weakness: Secondary | ICD-10-CM | POA: Diagnosis not present

## 2020-07-11 DIAGNOSIS — Z20822 Contact with and (suspected) exposure to covid-19: Secondary | ICD-10-CM | POA: Diagnosis not present

## 2020-07-11 DIAGNOSIS — K7291 Hepatic failure, unspecified with coma: Secondary | ICD-10-CM | POA: Diagnosis not present

## 2020-07-11 DIAGNOSIS — I8510 Secondary esophageal varices without bleeding: Secondary | ICD-10-CM | POA: Diagnosis not present

## 2020-07-11 DIAGNOSIS — K7460 Unspecified cirrhosis of liver: Secondary | ICD-10-CM | POA: Diagnosis not present

## 2020-07-11 DIAGNOSIS — I110 Hypertensive heart disease with heart failure: Secondary | ICD-10-CM | POA: Diagnosis not present

## 2020-07-11 DIAGNOSIS — Z79899 Other long term (current) drug therapy: Secondary | ICD-10-CM | POA: Diagnosis not present

## 2020-07-11 DIAGNOSIS — D649 Anemia, unspecified: Secondary | ICD-10-CM | POA: Diagnosis not present

## 2020-07-11 DIAGNOSIS — K7469 Other cirrhosis of liver: Secondary | ICD-10-CM | POA: Diagnosis not present

## 2020-07-11 DIAGNOSIS — I5032 Chronic diastolic (congestive) heart failure: Secondary | ICD-10-CM | POA: Diagnosis not present

## 2020-07-11 DIAGNOSIS — E119 Type 2 diabetes mellitus without complications: Secondary | ICD-10-CM | POA: Diagnosis not present

## 2020-07-11 DIAGNOSIS — K766 Portal hypertension: Secondary | ICD-10-CM | POA: Diagnosis not present

## 2020-07-11 DIAGNOSIS — I5030 Unspecified diastolic (congestive) heart failure: Secondary | ICD-10-CM | POA: Diagnosis not present

## 2020-07-11 DIAGNOSIS — M810 Age-related osteoporosis without current pathological fracture: Secondary | ICD-10-CM | POA: Diagnosis not present

## 2020-07-11 DIAGNOSIS — Z79890 Hormone replacement therapy (postmenopausal): Secondary | ICD-10-CM | POA: Diagnosis not present

## 2020-07-11 DIAGNOSIS — W19XXXA Unspecified fall, initial encounter: Secondary | ICD-10-CM | POA: Diagnosis not present

## 2020-07-11 LAB — COMPREHENSIVE METABOLIC PANEL
ALBUMIN: 2 g/dL — ABNORMAL LOW (ref 3.4–5.0)
ALKALINE PHOSPHATASE: 101 U/L (ref 46–116)
ANION GAP: 9 mmol/L (ref 5–14)
AST (SGOT): 32 U/L (ref <=34)
BILIRUBIN TOTAL: 3.8 mg/dL — ABNORMAL HIGH (ref 0.3–1.2)
BLOOD UREA NITROGEN: 22 mg/dL (ref 9–23)
BUN / CREAT RATIO: 15
CALCIUM: 8 mg/dL — ABNORMAL LOW (ref 8.7–10.4)
CHLORIDE: 105 mmol/L (ref 98–107)
CO2: 19 mmol/L — ABNORMAL LOW (ref 20.0–31.0)
CREATININE: 1.45 mg/dL — ABNORMAL HIGH
EGFR CKD-EPI AA FEMALE: 44 mL/min/{1.73_m2} — ABNORMAL LOW (ref >=60)
EGFR CKD-EPI NON-AA FEMALE: 38 mL/min/{1.73_m2} — ABNORMAL LOW (ref >=60)
GLUCOSE RANDOM: 207 mg/dL — ABNORMAL HIGH (ref 70–179)
POTASSIUM: 4 mmol/L (ref 3.4–4.5)
PROTEIN TOTAL: 4.8 g/dL — ABNORMAL LOW (ref 5.7–8.2)
SODIUM: 133 mmol/L — ABNORMAL LOW (ref 135–145)

## 2020-07-11 LAB — CBC W/ AUTO DIFF
BASOPHILS ABSOLUTE COUNT: 0 10*9/L (ref 0.0–0.1)
BASOPHILS RELATIVE PERCENT: 0.3 %
EOSINOPHILS ABSOLUTE COUNT: 0 10*9/L (ref 0.0–0.4)
EOSINOPHILS RELATIVE PERCENT: 0.1 %
HEMATOCRIT: 26.8 % — ABNORMAL LOW (ref 36.0–46.0)
HEMOGLOBIN: 8.6 g/dL — ABNORMAL LOW (ref 12.0–16.0)
LARGE UNSTAINED CELLS: 2 % (ref 0–4)
LYMPHOCYTES RELATIVE PERCENT: 5.4 %
MEAN CORPUSCULAR HEMOGLOBIN CONC: 32.2 g/dL (ref 31.0–37.0)
MEAN CORPUSCULAR HEMOGLOBIN: 28.8 pg (ref 26.0–34.0)
MEAN CORPUSCULAR VOLUME: 89.4 fL (ref 80.0–100.0)
MEAN PLATELET VOLUME: 14.7 fL — ABNORMAL HIGH (ref 7.0–10.0)
MONOCYTES ABSOLUTE COUNT: 0.6 10*9/L (ref 0.2–0.8)
MONOCYTES RELATIVE PERCENT: 5.4 %
NEUTROPHILS RELATIVE PERCENT: 86.7 %
PLATELET COUNT: 41 10*9/L — ABNORMAL LOW (ref 150–440)
RED BLOOD CELL COUNT: 2.99 10*12/L — ABNORMAL LOW (ref 4.00–5.20)
RED CELL DISTRIBUTION WIDTH: 18.5 % — ABNORMAL HIGH (ref 12.0–15.0)
WBC ADJUSTED: 10.1 10*9/L (ref 4.5–11.0)

## 2020-07-11 LAB — THYROID STIMULATING HORMONE: Thyrotropin:ACnc:Pt:Ser/Plas:Qn:: 2.163

## 2020-07-11 LAB — RETICULOCYTES
RETIC HGB CONTENT: 28.9 pg — ABNORMAL LOW (ref 29.7–36.1)
RETICULOCYTE COUNT PCT: 5.7 % — ABNORMAL HIGH (ref 0.5–2.7)

## 2020-07-11 LAB — FERRITIN: Ferritin:MCnc:Pt:Ser/Plas:Qn:: 8.5

## 2020-07-11 LAB — BLOOD GAS, VENOUS
BASE EXCESS VENOUS: -2.2 — ABNORMAL LOW (ref -2.0–2.0)
HCO3 VENOUS: 21 mmol/L — ABNORMAL LOW (ref 22–27)
O2 SATURATION VENOUS: 58.7 % (ref 40.0–85.0)
PCO2 VENOUS: 32 mmHg — ABNORMAL LOW (ref 40–60)
PH VENOUS: 7.44 — ABNORMAL HIGH (ref 7.32–7.43)

## 2020-07-11 LAB — VITAMIN B-12: Cobalamins:MCnc:Pt:Ser/Plas:Qn:: 915 — ABNORMAL HIGH

## 2020-07-11 LAB — FIO2 VENOUS

## 2020-07-11 LAB — FREE T4: Thyroxine.free:MCnc:Pt:Ser/Plas:Qn:: 1.18

## 2020-07-11 LAB — HIGH SENSITIVITY TROPONIN I: Lab: 20

## 2020-07-11 LAB — TOTAL IRON BINDING CAPACITY: Iron binding capacity:MCnc:Pt:Ser/Plas:Qn:: 320

## 2020-07-11 LAB — POLYCHROMASIA

## 2020-07-11 LAB — SLIDE REVIEW

## 2020-07-11 LAB — INR: Coagulation tissue factor induced.INR:RelTime:Pt:PPP:Qn:Coag: 1.91

## 2020-07-11 LAB — T4, FREE: FREE T4: 1.18 ng/dL (ref 0.89–1.76)

## 2020-07-11 LAB — LACTATE BLOOD VENOUS: Lactate:SCnc:Pt:BldV:Qn:: 1.9 — ABNORMAL HIGH

## 2020-07-11 LAB — IRON PANEL
IRON SATURATION: 12 %
TOTAL IRON BINDING CAPACITY: 320 ug/dL (ref 250–425)

## 2020-07-11 LAB — AMMONIA: Ammonia:SCnc:Pt:Plas:Qn:: 25

## 2020-07-11 LAB — RETIC HGB CONTENT: Lab: 28.9 — ABNORMAL LOW

## 2020-07-11 LAB — ANION GAP: Anion gap 3:SCnc:Pt:Ser/Plas:Qn:: 9

## 2020-07-11 LAB — LARGE UNSTAINED CELLS: Lab: 2

## 2020-07-11 LAB — HIGH-SENSITIVITY TROPONIN I - 2 HOUR: Lab: 18

## 2020-07-11 MED ADMIN — cholecalciferol (vitamin D3 25 mcg (1,000 units)) tablet 25 mcg: 25 ug | ORAL | @ 22:00:00

## 2020-07-11 MED ADMIN — albumin human 25 % bottle 25 g: 25 g | INTRAVENOUS | @ 22:00:00 | Stop: 2020-07-11

## 2020-07-11 MED ADMIN — bumetanide (BUMEX) tablet 2 mg: 2 mg | ORAL | @ 19:00:00 | Stop: 2020-07-11

## 2020-07-11 MED ADMIN — lactulose (CEPHULAC) packet 40 g: 40 g | ORAL | @ 19:00:00 | Stop: 2020-07-11

## 2020-07-11 MED ADMIN — spironolactone (ALDACTONE) tablet 100 mg: 100 mg | ORAL | @ 19:00:00 | Stop: 2020-07-11

## 2020-07-11 MED ADMIN — rifAXIMin (XIFAXAN) tablet 550 mg: 550 mg | ORAL | @ 19:00:00 | Stop: 2020-07-11

## 2020-07-11 NOTE — Unmapped (Signed)
Pt presents to ED with CC of . Husband states Pt's ammonia level was 97 Monday, and Pt slid off of ben to floor this morning. Husband endorses increasing weakness and confusion. Endorses good BMs. On liver txp list.

## 2020-07-11 NOTE — Unmapped (Signed)
[facility]  Emergency Department Provider Note      ED Clinical Impression     Final diagnoses:   Weakness (Primary)   Fall, initial encounter   Confusion       Initial Impression, ED Course, Assessment and Plan     Impression: 64 year old female with cirrhosis complicated by GAVE  requiring multiple transfusions and HE here with new weakness and confusion.  Exam and history from family most consistent with hepatic encephalopathy.  They do not endorse any infectious symptoms and no history of SBP in the past.  We will follow up white count and get UA given that she endorses some dysuria.  Has not had a bowel movement today but over the past week denies any constipation so less likely contributory.  Does have a history of GI bleeding and recent banding in early October so make sure hemoglobin is stable.  Will check BMP to ensure hypokalemia not causative. Exam not consistent with stroke.  Will get EKG to rule ensure no arrhythmia or ACS causing weakness.  Will get labs, EKG, ammonia and empirically give home lactulose and rifaximin since intake this morning.  As well as home diuretics.  ED Course as of Jul 11 1899   Sun Jul 11, 2020   1401 EKG sinus rhythm.  Left axis deviation.  New inverted T waves in 1 and aVL.  First-degree AV block.  Poor R wave progression.      1520 Hgb stable at 8.6 so unlikely that GI bleeding is contributing. Platelets stable at 41          1521 Troponin 20      1521 Ammonia not elevated at 25      1523 Meld score up to 25 from 20 in June.  With slightly worse creatinine, bilirubin, INR, urine sodium.      1714 Hs troponin 20_>18      1858 Will get CT head given fall and not sure whether she hit her head or not.  Will admit to med W for further work-up          Additional Medical Decision Making     I have reviewed the vital signs and the nursing notes. Labs and radiology results that were available during my care of the patient were independently reviewed by me and considered in my medical decision making.     I staffed the case with the ED attending, Dr. Matilde Haymaker.      I independently visualized the EKG tracing.   I reviewed the patient's prior medical records   I discussed the case with the admitting provider.     Portions of this record have been created using Scientist, clinical (histocompatibility and immunogenetics). Dictation errors have been sought, but may not have been identified and corrected.  ____________________________________________       History     Chief Complaint  Weakness      HPI   Marilyn French is a 64 y.o. female with hypertension, hyperlipidemia, type 2 diabetes mellitus, and cirrhosis secondary to NAFLD and GAVE complicated by chronic transfusions and hepatic encephalopathy that is presenting with weakness and confusion over the past week.  They came to the emergency room because this morning she slid out of the bed onto the floor and was not able to get up she was so weak.  She did not fall or hit her head.  She normally is able to get up and walk around the house without any assistance.  Her husband  also notes that she has been a little more confused this week saying that she put her underwear on over her jeans.  She she only naps once during the day.  Frequently overnight to use the bathroom.  Has not missed any lactulose until this morning when he came to the emergency room so she has not taken any of her medicines today.  But normally takes lactulose 3 times daily and rifaximin.  Has been does not know if she is having blood in her stool.  He does not look.  She did vomit one time with a small amount of brown emesis.  She does note some dysuria.  No increased frequency or hematuria.  Not coffee-ground or bright red.  She had a TIPS procedure last year per her husband.  She does not have chronic paracenteses.  But she has had them before he thinks may be last year.  He does not have remember having an infection in her peritoneal fluid  No one at home is sick, no fever, no cough, no headache.    Upper endoscopy on October 1 with grade 1 varices, Portal hypertensive gastropathy, gastric antral vascular ectasia with bleeding which were banded.          Past Medical History:   Diagnosis Date   ??? Cirrhosis (CMS-HCC)    ??? Depression    ??? Diabetes mellitus (CMS-HCC)    ??? Fibromyalgia    ??? GAVE (gastric antral vascular ectasia)    ??? History of transfusion 04/2019    reports frequent blood tranfusions   ??? HTN (hypertension)    ??? Hypercholesterolemia    ??? Hypothyroid    ??? NAFLD (nonalcoholic fatty liver disease)    ??? Osteoporosis        Patient Active Problem List   Diagnosis   ??? Abnormal echocardiogram   ??? NAFLD (nonalcoholic fatty liver disease)   ??? Cirrhosis (CMS-HCC)   ??? GAVE (gastric antral vascular ectasia)   ??? Hypothyroid   ??? Chronic blood loss anemia   ??? Generalized anxiety disorder   ??? Hepatic encephalopathy (CMS-HCC)   ??? Iron deficiency anemia   ??? Type 2 diabetes mellitus without complication, with long-term current use of insulin (CMS-HCC)   ??? Pulmonary hypertension (CMS-HCC)   ??? Aspiration into airway   ??? Thrombocytopenia (CMS-HCC)       Past Surgical History:   Procedure Laterality Date   ??? APPENDECTOMY     ??? CHOLECYSTECTOMY     ??? IR TIPS  07/08/2018    IR TIPS 07/08/2018 Soledad Gerlach, MD IMG VIR H&V [facility]   ??? PR COLSC FLEXIBLE W/CONTROL BLEEDING ANY METHOD N/A 10/17/2019    Procedure: COLONOSCOPY, FLEXIBLE, PROXIMAL TO SPLENIC FLEXURE; DX, W/CONTROL OF BLEEDING;  Surgeon: Jules Husbands, MD;  [facility];  Service: Gastroenterology   ??? PR RIGHT HEART CATH O2 SATURATION & CARDIAC OUTPUT Right 01/18/2018    Procedure: Right Heart Catheterization;  Surgeon: Marlaine Hind, MD;  Location: [facility] CATH;  Service: Cardiology   ??? PR UPPER GI ENDOSCOPY,CTRL BLEED N/A 03/05/2019    Procedure: UGI ENDOSCOPY; WITH CONTROL OF BLEEDING, ANY METHOD;  Surgeon: Andrey Farmer, MD;  [facility]: [facility];  Service: Gastroenterology   ??? PR UPPER GI ENDOSCOPY,CTRL BLEED  04/04/2019    Procedure: UGI ENDOSCOPY; WITH CONTROL OF BLEEDING, ANY METHOD;  Surgeon: Janyth Pupa, MD;  [facility];  Service: Gastroenterology   ??? PR UPPER GI  ENDOSCOPY,CTRL BLEED N/A 05/23/2019    Procedure: UGI ENDOSCOPY; WITH CONTROL OF BLEEDING, ANY METHOD;  Surgeon: Janyth Pupa, MD;  [facility];  Service: Gastroenterology   ??? PR UPPER GI ENDOSCOPY,CTRL BLEED N/A 06/13/2019    Procedure: UGI ENDOSCOPY; WITH CONTROL OF BLEEDING, ANY METHOD;  Surgeon: Annie Paras, MD;  Location: [facility];  Service: Gastroenterology   ??? PR UPPER GI ENDOSCOPY,CTRL BLEED N/A 07/11/2019    Procedure: UGI ENDOSCOPY; WITH CONTROL OF BLEEDING, ANY METHOD;  Surgeon: Annie Paras, MD;  Location: [facility];  Service: Gastroenterology   ??? PR UPPER GI ENDOSCOPY,CTRL BLEED N/A 09/16/2019    Procedure: UGI ENDOSCOPY; WITH CONTROL OF BLEEDING, ANY METHOD;  Surgeon: Beverly Milch, MD;  [facility];  Service: Gastrointestinal   ??? PR UPPER GI ENDOSCOPY,CTRL BLEED N/A 10/07/2019    Procedure: UGI ENDOSCOPY; WITH CONTROL OF BLEEDING, ANY METHOD;  Surgeon: Pia Mau, MD;  Location: [facility];  Service: Gastroenterology   ??? PR UPPER GI ENDOSCOPY,CTRL BLEED N/A 01/02/2020    Procedure: UGI ENDOSCOPY; WITH CONTROL OF BLEEDING, ANY METHOD;  Surgeon: Janyth Pupa, MD;  [facility]: [facility];  Service: Gastroenterology   ??? PR UPPER GI ENDOSCOPY,CTRL BLEED N/A 04/09/2020    Procedure: UGI ENDOSCOPY; WITH CONTROL OF BLEEDING, ANY METHOD;  Surgeon: Janyth Pupa, MD;  Location: [facility];  Service: Gastroenterology   ??? PR UPPER GI ENDOSCOPY,DIAGNOSIS N/A 03/03/2019    Procedure: UGI ENDO, INCLUDE ESOPHAGUS, STOMACH, & DUODENUM &/OR JEJUNUM; DX W/WO COLLECTION SPECIMN, BY BRUSH OR WASH;  Surgeon: Luanne Bras, MD;  [facility]: [facility];  Service: Gastroenterology   ??? PR UPPER GI ENDOSCOPY,LIGAT VARIX N/A 01/02/2020    Procedure: UGI ENDO; Everlene Balls LIG ESOPH &/OR GASTRIC VARICES;  Surgeon: Janyth Pupa, MD;  Location: [facility];  Service: Gastroenterology   ??? PR UPPER GI ENDOSCOPY,LIGAT VARIX N/A 04/09/2020    Procedure: UGI ENDO; W/BAND LIG ESOPH &/OR GASTRIC VARICES;  Surgeon: Janyth Pupa, MD;  [facility]: [facility];  Service: Gastroenterology   ??? PR UPPER GI ENDOSCOPY,LIGAT VARIX N/A 06/18/2020    Procedure: UGI ENDO; Everlene Balls LIG ESOPH &/OR GASTRIC VARICES;  Surgeon: Janyth Pupa, MD;  Location: [facility];  Service: Gastroenterology   ??? SALPINGOOPHORECTOMY           Current Facility-Administered Medications:   ???  [START ON 07/12/2020] albumin human 25 % bottle 25 g, 25 g, Intravenous, Once, Sherwin A Novin, MD  ???  atorvastatin (LIPITOR) tablet 10 mg, 10 mg, Oral, Nightly, Sherwin A Novin, MD  ???  busPIRone (BUSPAR) tablet 10 mg, 10 mg, Oral, BID, Sharen Counter, MD  ???  cholecalciferol (vitamin D3 25 mcg (1,000 units)) tablet 25 mcg, 25 mcg, Oral, Every Other Day, Sherwin A Novin, MD, 25 mcg at 07/11/20 1813  ???  dextrose 50 % in water (D50W) 50 % solution 12.5 g, 12.5 g, Intravenous, Q10 Min PRN, Sharen Counter, MD  ???  enoxaparin (LOVENOX) syringe 40 mg, 40 mg, Subcutaneous, Q24H, Sherwin A Novin, MD  ???  [START ON 07/12/2020] insulin glargine (LANTUS) injection 35 Units, 35 Units, Subcutaneous, daily, Sharen Counter, MD  ???  insulin lispro (HumaLOG) injection 0-12 Units, 0-12 Units, Subcutaneous, ACHS, Sharen Counter, MD  ???  lactulose (CHRONULAC) oral solution (30 mL cup), 30 g, Oral, TID, Sherwin A  Novin, MD  ???  [START ON 07/12/2020] levothyroxine (SYNTHROID) tablet 25 mcg, 25 mcg, Oral, daily, Sherwin A Novin, MD  ???  rifAXIMin (XIFAXAN) tablet 550 mg, 550 mg, Oral, BID, Sherwin A Novin, MD  ???  sertraline (ZOLOFT) tablet 50 mg, 50 mg, Oral, Daily, Sharen Counter, MD, 50 mg at 07/11/20 1813    Current Outpatient Medications:   ???  bumetanide (BUMEX) 2 MG tablet, Take 0.5 tablets (1 mg total) by mouth two (2) times a day., Disp: 30 tablet, Rfl: 0  ???  busPIRone (BUSPAR) 10 MG tablet, Take 1 tablet by mouth twice daily, Disp: 120 tablet, Rfl: 0  ???  cholecalciferol, vitamin D3, (CHOLECALCIFEROL) 1,000 unit tablet, Take 1,000 Units by mouth every other day. , Disp: , Rfl:   ???  insulin glargine (LANTUS SOLOSTAR U-100 INSULIN) 100 unit/mL (3 mL) injection pen, Inject 0.45 mL (45 Units total) under the skin daily., Disp: 1350 Units, Rfl: 0  ???  insulin lispro (HUMALOG) 100 unit/mL injection, INJECT 15 TO 35 UNITS SUBCUTANEUOSLY PER SLIDING SCALE 3 TIMES DAILY, Disp: , Rfl:   ???  lactulose (CHRONULAC) 10 gram/15 mL solution, Take 20 g by mouth Four (4) times a day. , Disp: , Rfl:   ???  levothyroxine (SYNTHROID, LEVOTHROID) 25 MCG tablet, Take 25 mcg by mouth daily., Disp: , Rfl:   ???  MAGNESIUM ORAL, Take by mouth., Disp: , Rfl:   ???  pediatric multivit-iron-min (FLINTSTONES COMPLETE) tablet, Chew 2 tablets daily. , Disp: , Rfl:   ???  pen needle, diabetic 31 gauge x 1/4 Ndle, , Disp: , Rfl:   ???  potassium chloride SA (K-DUR,KLOR-CON) 20 MEQ tablet, Take 20 mEq by mouth daily. , Disp: , Rfl:   ???  rifAXIMin (XIFAXAN) 550 mg Tab, Take 1 tablet (550 mg total) by mouth Two (2) times a day., Disp: 60 tablet, Rfl: 11  ???  rosuvastatin (CRESTOR) 5 MG tablet, Take 5 mg by mouth every other day., Disp: , Rfl:   ???  sertraline (ZOLOFT) 50 MG tablet, Take 1 tablet (50 mg total) by mouth daily., Disp: 30 tablet, Rfl: 11  ???  spironolactone (ALDACTONE) 100 MG tablet, Take 1 tablet (100 mg total) by mouth daily., Disp: 30 tablet, Rfl: 11  ???  ZINC ACETATE ORAL, Take by mouth., Disp: , Rfl:     Allergies  Ferumoxytol, Definity [perflutren lipid microspheres], and Tramadol hcl    History reviewed. No pertinent family history.    Social History  Social History     Tobacco Use ??? Smoking status: Never Smoker   ??? Smokeless tobacco: Never Used   Vaping Use   ??? Vaping Use: Never used   Substance Use Topics   ??? Alcohol use: No   ??? Drug use: No       Review of Systems  Constitutional: Negative for fever.  Eyes: Negative for visual changes.  ENT: Negative for sore throat.  Cardiovascular: Negative for chest pain.  Respiratory: Negative for shortness of breath.  Gastrointestinal: Negative for abdominal pain, vomiting or diarrhea.  Musculoskeletal: Negative for back pain.  Skin: Negative for rash.  Neurological: Negative for headaches, focal weakness or numbness.    Physical Exam     ED Triage Vitals [07/11/20 1232]   Enc Vitals Group      BP 137/48      Heart Rate 85      SpO2 Pulse       Resp 20      Temp  37.4 ??C (99.3 ??F)      Temp src       SpO2 100 %      Weight       Height       Head Circumference       Peak Flow       Pain Score       Pain Loc       Pain Edu?       Excl. in GC?        Constitutional: Alert and oriented x 2. Well appearing and in no distress. Closes eyes during exam  Eyes: Conjunctivae are normal.  ENT       Head: Normocephalic and atraumatic.       Nose: No congestion.       Mouth/Throat: Mucous membranes are moist.       Neck: No stridor.  Hematological/Lymphatic/Immunilogical: No cervical lymphadenopathy.  Cardiovascular: Normal rate, regular rhythm. 2/6 systolic murmur Normal and symmetric distal pulses are present in all extremities.  Respiratory: Normal respiratory effort. Breath sounds are normal.  Gastrointestinal: Soft and mild upper abdominal tenderness. There is no CVA tenderness.  Musculoskeletal: Normal range of motion in all extremities.       Right lower leg: No tenderness or edema.       Left lower leg: No tenderness or edema.  Neurologic: Normal speech and language. No gross focal neurologic deficits are appreciated. Can't say months backward or spell WORLD backwards. Mild asterixis   Skin: Skin is warm, dry and intact. No rash noted.  Psychiatric: Mood and affect are normal. Speech and behavior are normal.      EKG     New inverted T waves in 1 and aVL    Radiology     None       Procedures     None           Bennie Pierini, MD  Resident  07/11/20 1900

## 2020-07-11 NOTE — Unmapped (Signed)
On call page received from [facility] - Reports Marilyn French slid / fell out of bed this am and with increased confusion. No lactulose taken this am . Received 2 units prbc last week (chronic anemia). Spouse en route to [facility] ed with patient .

## 2020-07-12 ENCOUNTER — Telehealth (INDEPENDENT_AMBULATORY_CARE_PROVIDER_SITE_OTHER): Payer: Self-pay

## 2020-07-12 LAB — HEPATIC FUNCTION PANEL
ALBUMIN: 2.3 g/dL — ABNORMAL LOW (ref 3.4–5.0)
ALKALINE PHOSPHATASE: 91 U/L (ref 46–116)
AST (SGOT): 23 U/L (ref <=34)
BILIRUBIN DIRECT: 1.5 mg/dL — ABNORMAL HIGH (ref 0.00–0.30)
BILIRUBIN TOTAL: 3.2 mg/dL — ABNORMAL HIGH (ref 0.3–1.2)
PROTEIN TOTAL: 4.7 g/dL — ABNORMAL LOW (ref 5.7–8.2)

## 2020-07-12 LAB — CBC
HEMATOCRIT: 25.1 % — ABNORMAL LOW (ref 36.0–46.0)
HEMOGLOBIN: 7.9 g/dL — ABNORMAL LOW (ref 12.0–16.0)
MEAN CORPUSCULAR HEMOGLOBIN CONC: 31.5 g/dL (ref 31.0–37.0)
MEAN CORPUSCULAR HEMOGLOBIN: 27.7 pg (ref 26.0–34.0)
MEAN CORPUSCULAR VOLUME: 88 fL (ref 80.0–100.0)
MEAN PLATELET VOLUME: 14.7 fL — ABNORMAL HIGH (ref 7.0–10.0)
PLATELET COUNT: 42 10*9/L — ABNORMAL LOW (ref 150–440)
RED BLOOD CELL COUNT: 2.85 10*12/L — ABNORMAL LOW (ref 4.00–5.20)
RED CELL DISTRIBUTION WIDTH: 18.5 % — ABNORMAL HIGH (ref 12.0–15.0)
WBC ADJUSTED: 4.6 10*9/L (ref 4.5–11.0)

## 2020-07-12 LAB — BASIC METABOLIC PANEL
ANION GAP: 6 mmol/L (ref 5–14)
BLOOD UREA NITROGEN: 31 mg/dL — ABNORMAL HIGH (ref 9–23)
BUN / CREAT RATIO: 21
CALCIUM: 8.3 mg/dL — ABNORMAL LOW (ref 8.7–10.4)
CHLORIDE: 104 mmol/L (ref 98–107)
CREATININE: 1.51 mg/dL — ABNORMAL HIGH
EGFR CKD-EPI AA FEMALE: 42 mL/min/{1.73_m2} — ABNORMAL LOW (ref >=60)
EGFR CKD-EPI NON-AA FEMALE: 36 mL/min/{1.73_m2} — ABNORMAL LOW (ref >=60)
GLUCOSE RANDOM: 129 mg/dL (ref 70–179)
POTASSIUM: 3.4 mmol/L (ref 3.4–4.5)
SODIUM: 135 mmol/L (ref 135–145)

## 2020-07-12 LAB — CHLORIDE: Chloride:SCnc:Pt:Ser/Plas:Qn:: 104

## 2020-07-12 LAB — HEMOGLOBIN: Hemoglobin:MCnc:Pt:Bld:Qn:: 7.9 — ABNORMAL LOW

## 2020-07-12 LAB — PHOSPHORUS: Phosphate:MCnc:Pt:Ser/Plas:Qn:: 3.8

## 2020-07-12 LAB — MAGNESIUM
MAGNESIUM: 1.6 mg/dL (ref 1.6–2.6)
Magnesium:MCnc:Pt:Ser/Plas:Qn:: 1.6

## 2020-07-12 LAB — ALT (SGPT): Alanine aminotransferase:CCnc:Pt:Ser/Plas:Qn:: 22

## 2020-07-12 MED ADMIN — lactulose (CHRONULAC) oral solution (30 mL cup): 30 g | ORAL | @ 04:00:00

## 2020-07-12 MED ADMIN — enoxaparin (LOVENOX) syringe 40 mg: 40 mg | SUBCUTANEOUS | @ 04:00:00 | Stop: 2020-07-12

## 2020-07-12 MED ADMIN — albumin human 25 % bottle 25 g: 25 g | INTRAVENOUS | @ 07:00:00 | Stop: 2020-07-12

## 2020-07-12 MED ADMIN — insulin lispro (HumaLOG) injection 0-12 Units: 0-12 [IU] | SUBCUTANEOUS | @ 21:00:00

## 2020-07-12 MED ADMIN — magnesium sulfate 2gm/50mL IVPB: 2 g | INTRAVENOUS | @ 20:00:00 | Stop: 2020-07-12

## 2020-07-12 MED ADMIN — albumin human 25 % bottle 25 g: 25 g | INTRAVENOUS | @ 17:00:00 | Stop: 2020-07-12

## 2020-07-12 MED ADMIN — diclofenac sodium (VOLTAREN) 1 % gel 2 g: 2 g | TOPICAL | @ 21:00:00

## 2020-07-12 MED ADMIN — insulin glargine (LANTUS) injection 35 Units: 35 [IU] | SUBCUTANEOUS | @ 14:00:00 | Stop: 2020-07-12

## 2020-07-12 MED ADMIN — levothyroxine (SYNTHROID) tablet 25 mcg: 25 ug | ORAL | @ 10:00:00

## 2020-07-12 MED ADMIN — busPIRone (BUSPAR) tablet 10 mg: 10 mg | ORAL | @ 17:00:00

## 2020-07-12 MED ADMIN — rifAXIMin (XIFAXAN) tablet 550 mg: 550 mg | ORAL | @ 13:00:00 | Stop: 2020-10-18

## 2020-07-12 MED ADMIN — sertraline (ZOLOFT) tablet 50 mg: 50 mg | ORAL | @ 13:00:00

## 2020-07-12 MED ADMIN — potassium chloride (KLOR-CON) CR tablet 20 mEq: 20 meq | ORAL | @ 20:00:00 | Stop: 2020-07-12

## 2020-07-12 NOTE — Telephone Encounter (Signed)
Noted and Dr.Rehman will be made aware.
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
## 2020-07-12 NOTE — Unmapped (Signed)
PHYSICAL THERAPY  Evaluation (07/12/20 0905)     Patient Name:  Marilyn French       Medical Record Number: [id number]   Date of Birth: [birth year]  Sex: Female                 Activity Tolerance: Tolerated treatment well    ASSESSMENT  Problem List: Decreased strength, Decreased endurance, Fall Risk, Impaired balance, Decreased coordination, Decreased mobility, Impaired judgement, Decreased safety awareness, Cognitive/Behavioral impairments, Decreased cognition, Gait deviation, Impaired ADLs     Assessment : Pt is a 64 yo F admitted after sliding out of bed. Pt presents with decreased mental status and hepatic encephalopathy. Recommending 3x/week PT services upon discharge as pt is close to functional mobility baseline.          Personal Factors/Comorbidities Present: 3   Specific Comorbidities : cirrhosis, HTN, DM   Examination of Body System: 1-3 elements   Body System: MS, neurological, cardiopulmonary.   Clinical Decision Making: Low     PLAN  Planned Frequency of Treatment:  1-2x per day for: 3-4x week      Planned Interventions: Balance activities, Education - Patient, Education - Family / caregiver, Functional mobility, Gait training, Self-care / Home training, Stair training, Therapeutic exercise, Therapeutic activity, Transfer training    Post-Discharge Physical Therapy Recommendations:  3x weekly    PT DME Recommendations: Three in one commode (Pt owns walker and cane.)           Goals:   Patient and Family Goals: none stated    Long Term Goal #1: In 1 month, pt will be SBA for all household mobility using LRAD.       SHORT GOAL #1: Pt will perform all functional transfers with LRAD and CGA.              Time Frame : 1 week  SHORT GOAL #2: Pt will ambulate 25 ft with LRAD and CGA.              Time Frame : 1 week  SHORT GOAL #3: Pt will negotiate 4 stairs with B railings and CGA.              Time Frame : 2 weeks                                        Prognosis:  Good  Positive Indicators: family support.  Barriers to Discharge: Impaired Balance, Inability to safely perform ADLS, Poor insight into deficits, Inaccessible home environment, Decreased safety awareness, Cognitive deficits, Gait instability, Endurance deficits    SUBJECTIVE  Equipment / Environment: Patient not wearing mask for full session, Vascular access (PIV, TLC, Port-a-cath, PICC)  Patient reports: Pt agreeable to therapy. No family present.  Current Functional Status: Pt in bed upon PT arrival.     Prior Functional Status: Pt reports she uses her cane primarily to ambulate. I spend most of my time in the bathroom.  Equipment available at home: Denzil Hughes     Past Medical History:   Diagnosis Date   ??? Cirrhosis (CMS-HCC)    ??? Depression    ??? Diabetes mellitus (CMS-HCC)    ??? Fibromyalgia    ??? GAVE (gastric antral vascular ectasia)    ??? History of transfusion 04/2019    reports frequent blood tranfusions   ??? HTN (hypertension)    ??? Hypercholesterolemia    ???  Hypothyroid    ??? NAFLD (nonalcoholic fatty liver disease)    ??? Osteoporosis     Social History     Tobacco Use   ??? Smoking status: Never Smoker   ??? Smokeless tobacco: Never Used   Substance Use Topics   ??? Alcohol use: No      Past Surgical History:   Procedure Laterality Date   ??? APPENDECTOMY     ??? CHOLECYSTECTOMY     ??? IR TIPS  07/08/2018    IR TIPS 07/08/2018 Soledad Gerlach, MD IMG VIR H&V [facility]   ??? PR COLSC FLEXIBLE W/CONTROL BLEEDING ANY METHOD N/A 10/17/2019    Procedure: COLONOSCOPY, FLEXIBLE, PROXIMAL TO SPLENIC FLEXURE; DX, W/CONTROL OF BLEEDING;  Surgeon: Jules Husbands, MD;  [facility];  Service: Gastroenterology   ??? PR RIGHT HEART CATH O2 SATURATION & CARDIAC OUTPUT Right 01/18/2018    Procedure: Right Heart Catheterization;  Surgeon: Marlaine Hind, MD;  Location: [facility] CATH;  Service: Cardiology   ??? PR UPPER GI ENDOSCOPY,CTRL BLEED N/A 03/05/2019    Procedure: UGI ENDOSCOPY; WITH CONTROL OF BLEEDING, ANY METHOD;  Surgeon: Andrey Farmer, MD;  Location: [facility];  Service: Gastroenterology   ??? PR UPPER GI ENDOSCOPY,CTRL BLEED  04/04/2019    Procedure: UGI ENDOSCOPY; WITH CONTROL OF BLEEDING, ANY METHOD;  Surgeon: Janyth Pupa, MD;  Location: [facility];  Service: Gastroenterology   ??? PR UPPER GI ENDOSCOPY,CTRL BLEED N/A 05/23/2019    Procedure: UGI ENDOSCOPY; WITH CONTROL OF BLEEDING, ANY METHOD;  Surgeon: Janyth Pupa, MD;  Location: [facility];  Service: Gastroenterology   ??? PR UPPER GI ENDOSCOPY,CTRL BLEED N/A 06/13/2019    Procedure: UGI ENDOSCOPY; WITH CONTROL OF BLEEDING, ANY METHOD;  Surgeon: Annie Paras, MD;  Location: [facility];  Service: Gastroenterology   ??? PR UPPER GI ENDOSCOPY,CTRL BLEED N/A 07/11/2019    Procedure: UGI ENDOSCOPY; WITH CONTROL OF BLEEDING, ANY METHOD;  Surgeon: Annie Paras, MD;  Location: [facility];  Service: Gastroenterology   ??? PR UPPER GI ENDOSCOPY,CTRL BLEED N/A 09/16/2019    Procedure: UGI ENDOSCOPY; WITH CONTROL OF BLEEDING, ANY METHOD;  Surgeon: Beverly Milch, MD;  Location: [facility];  Service: Gastrointestinal   ??? PR UPPER GI ENDOSCOPY,CTRL BLEED N/A 10/07/2019    Procedure: UGI ENDOSCOPY; WITH CONTROL OF BLEEDING, ANY METHOD;  Surgeon: Pia Mau, MD;  Location: [facility];  Service: Gastroenterology   ??? PR UPPER GI ENDOSCOPY,CTRL BLEED N/A 01/02/2020    Procedure: UGI ENDOSCOPY; WITH CONTROL OF BLEEDING, ANY METHOD;  Surgeon: Janyth Pupa, MD;  Location: [facility];  Service: Gastroenterology   ??? PR UPPER GI ENDOSCOPY,CTRL BLEED N/A 04/09/2020    Procedure: UGI ENDOSCOPY; WITH CONTROL OF BLEEDING, ANY METHOD;  Surgeon: Janyth Pupa, MD;  Location: [name];  Service: Gastroenterology   ??? PR UPPER GI ENDOSCOPY,DIAGNOSIS N/A 03/03/2019    Procedure: UGI ENDO, INCLUDE ESOPHAGUS, STOMACH, & DUODENUM &/OR JEJUNUM; DX W/WO COLLECTION SPECIMN, BY BRUSH OR WASH;  Surgeon: Luanne Bras, MD;  Location: [facility];  Service: Gastroenterology   ??? PR UPPER GI ENDOSCOPY,LIGAT VARIX N/A 01/02/2020    Procedure: UGI ENDO; Everlene Balls LIG ESOPH &/OR GASTRIC VARICES;  Surgeon: Janyth Pupa, MD;  Location: [facility];  Service: Gastroenterology   ??? PR UPPER GI ENDOSCOPY,LIGAT VARIX N/A 04/09/2020  Procedure: UGI ENDO; W/BAND LIG ESOPH &/OR GASTRIC VARICES;  Surgeon: Janyth Pupa, MD;  Location: [facility];  Service: Gastroenterology   ??? PR UPPER GI ENDOSCOPY,LIGAT VARIX N/A 06/18/2020    Procedure: UGI ENDO; Everlene Balls LIG ESOPH &/OR GASTRIC VARICES;  Surgeon: Janyth Pupa, MD;  Location: [facility];  Service: Gastroenterology   ??? SALPINGOOPHORECTOMY      History reviewed. No pertinent family history.     Allergies: Ferumoxytol, Definity [perflutren lipid microspheres], and Tramadol hcl                Objective Findings  Precautions / Restrictions  Precautions: Falls precautions  Weight Bearing Status: Non-applicable  Required Braces or Orthoses: Non-applicable        Pain Comments: Pt reporting 4/10 HA and nausea. RN made aware. Pt denies need for additional intervention.     Equipment / Environment: Vascular access (PIV, TLC, Port-a-cath, PICC)    At Rest: NAD  With Activity: NAD          Living Situation  Living Environment: House  Lives With: Spouse  Home Living: Two level home, Able to Live on main level with bedroom/bathroom, Built-in shower seat, Grab bars in shower, Raised toilet seat with rails, Stairs to enter with rails, Walk-in shower  Rail placement (outside): Bilateral rails  Number of Stairs to Enter (outside): 4     Cognition  Cognition comment: A&O to self, month, and year. Unable to state place despite cues from PT in room. Increased response time to questioning at time during session.  Visual / Perception status  Visual/Perception: Wears Glasses/Contacts       UE ROM / Strength  UE ROM/Strength: Right Intact, Left Intact  LE ROM / Strength  LE ROM/Strength: Left Impaired/Limited, Right Impaired/Limited  RLE Impairment: Reduced strength  LLE Impairment: Reduced strength  LE comment: 4/5 grossly                        Bed Mobility: Supine to/from sit with CGA.  Transfers: Sit to stand CGA without assistive device.   Gait  Distance Ambulated (ft): 3 ft  Gait: Pt able to take sidesteps towards HOB. Pt fatigued after minimal mobility. After seated rest break, pt able to march in place x20 reps with CGA. Pt holding onto bedrail for support.          Endurance: decreased. pt required seated rest breaks after minimal mobility.    Physical Therapy Session Duration  PT Co-Treatment [mins]: 39  Reason for Co-treatment: Poor activity tolerance    Medical Staff Made Aware: RN Lurena Joiner    I attest that I have reviewed the above information.  Signed: Janetta Hora, PT  Filed 07/12/2020

## 2020-07-12 NOTE — Unmapped (Signed)
Internal Medicine (MEDW) History & Physical    Assessment & Plan:   Marilyn French is a 64 y.o. female with PMHx of cirrhosis secondary to NAFLD, GAVE, hypothyroidism, type 2 diabetes  that presented to [facility] with altered mental status.     Principal Problem:    Hepatic encephalopathy (CMS-HCC)  Active Problems:    NAFLD (nonalcoholic fatty liver disease)    Cirrhosis (CMS-HCC)    GAVE (gastric antral vascular ectasia)    Hypothyroid    Type 2 diabetes mellitus without complication, with long-term current use of insulin (CMS-HCC)  Resolved Problems:    * No resolved hospital problems. *    Altered Mental Status I Concern for Hepatic Encephalopathy  Has reportedly had multiple episodes of hepatic encephalopathy in the past and takes lactulose at home as well as rifaximin. Mild asterixis on exam. She is also s/p TIPS which could increase her likelihood of HE. Will continue to monitor and workup. No clear causes on her lab work. Will also perform infectious workup.  - Obtain CT head non contrast  - Obtain blood cultures and U/A with reflex cultures  - CXR ordered  - Obtain urine toxicology  - Continue lactulose 30g tid  - Continue rifaximin bid  - Obtain TSH and vitamin B12    Decompensated Cirrhosis I Non Alcoholic Fatty Liver Disease I S/p TIPS 2019  Follows with transplant hepatology. Decompensated by the history of hepatic encephalopathy and ascites, though she appears to have been stable from the ascites perspective on home diuresis. She underwent a TIPS on 07/08/18.   - Abdominal ultrasound to assess for ascites. Consider tapping if present to rule out SBP  - Holding diuretics in the setting of elevated creatinine for now  - Daily MELD labs    MELD-Na score: 25 at 07/11/2020  1:38 PM  MELD score: 23 at 07/11/2020  1:38 PM  Calculated from:  Serum Creatinine: 1.45 mg/dL at 09/60/4540  9:81 PM  Serum Sodium: 133 mmol/L at 07/11/2020  1:38 PM  Total Bilirubin: 3.8 mg/dL at 19/14/7829  5:62 PM  INR(ratio): 1.91 at 07/11/2020  1:38 PM  Age: 65 years    Heart Failure with Preserved Ejection Fraction  Last echo on 12/30/19 with preserved EF and otherwise mild dilation of the left and right atria. Evidence of grade 1 intrapulmonary shunt likely secondary to cirrhosis.   - Holding diuresis as above    Elevated Creatinine  Baseline creatinine this year appears to be between 1-1.2 though it has been slowly increasing. Unclear if this is a true AKI or new baseline. Will trial albumin and assess response.   - Administer albumin today  - Hold diuretics  - Avoid nephrotoxins    Gastric Antral Vascular Ectasia I Anemia  Likely secondary to cirrhosis. Is chronically anemic and intermittently requires blood transfusions. Last EGD on 06/18/20 re-demonstrated GAVE with portal gastropathy and grade 1 esophageal varices. Hemoglobin of 8.6 on admission which appears stable.   - Obtain iron panel and ferritin  - Follow daily CBC  - Transfuse for Hgb <7    Type 2 Diabetes Mellitus  Last A1c on 02/19/20 at 5.4%. Takes lantus solostar 55 units in the morning and 10 units in the evening. Given altered mental status and possible decreased poor po intake, will continue lantus at lower dose.  - Lantus 35 units daily  - Sliding scale insulin    Hypothyroidism  Takes levothyroxine at home.  We will recheck a TSH.  - Levothyroxine 25  mcg daily  - TSH and reflex T4    Chief Concern:   Altered Mental Status    Subjective:   HPI:  Marilyn French is a 64 y.o. female with PMHx of cirrhosis secondary to NAFLD, GAVE, hypothyroidism, type 2 diabetes  that presented to [facility] with altered mental status.    Over the past week patient has been noticed that the patient feels more fatigued and weak throughout her body.  In addition, she is slowly progressing altered mental status exhibited by the fact that she is having difficulty dressing herself and wearing her clothes in reverse as well as speaking slowly and not acting like herself her husband.  The patient herself states that she is also having some mild abdominal tenderness though denies other GI symptoms like diarrhea, hematochezia, melena, nausea, vomiting.  She states that she has never had fluid both of her bowel in the past that she can recall.  Currently takes lactulose and rifaximin and states she feels she has been taking her lactulose regularly her husband said that recently she has been unintentionally using less of it.  She tends to put her lactulose into Dixie cups and states that a full duty Is just about her prescribed dose so recently she has been only putting some droplets of lactulose into her Dixie cup instead.     Family states that patient slid out of bed earlier today and fell while she did not hit her head.  She denies fever, chest pain, shortness of breath.    Allergies:  Ferumoxytol, Definity [perflutren lipid microspheres], and Tramadol hcl    Medications:   Prior to Admission medications    Medication Dose, Route, Frequency   bumetanide (BUMEX) 2 MG tablet 1 mg, Oral, 2 times a day   busPIRone (BUSPAR) 10 MG tablet Take 1 tablet by mouth twice daily   cholecalciferol, vitamin D3, (CHOLECALCIFEROL) 1,000 unit tablet 1,000 Units, Oral, Every other day   insulin glargine (LANTUS SOLOSTAR U-100 INSULIN) 100 unit/mL (3 mL) injection pen 45 Units, Subcutaneous, Daily (standard)   insulin lispro (HUMALOG) 100 unit/mL injection INJECT 15 TO 35 UNITS SUBCUTANEUOSLY PER SLIDING SCALE 3 TIMES DAILY    lactulose (CHRONULAC) 10 gram/15 mL solution 20 g, Oral, 4 times a day   levothyroxine (SYNTHROID, LEVOTHROID) 25 MCG tablet 25 mcg, Oral, Daily (standard)   MAGNESIUM ORAL Oral   pediatric multivit-iron-min (FLINTSTONES COMPLETE) tablet 2 tablets, Oral, Daily (standard)   pen needle, diabetic 31 gauge x 1/4 Ndle No dose, route, or frequency recorded.   potassium chloride SA (K-DUR,KLOR-CON) 20 MEQ tablet 20 mEq, Oral, Daily (standard)   rifAXIMin (XIFAXAN) 550 mg Tab 550 mg, Oral, 2 times a day (standard) rosuvastatin (CRESTOR) 5 MG tablet 5 mg, Oral, Every other day   sertraline (ZOLOFT) 50 MG tablet 50 mg, Oral, Daily (standard)   spironolactone (ALDACTONE) 100 MG tablet 100 mg, Oral, Daily (standard)   ZINC ACETATE ORAL Oral       Medical History:  Past Medical History:   Diagnosis Date   ??? Cirrhosis (CMS-HCC)    ??? Depression    ??? Diabetes mellitus (CMS-HCC)    ??? Fibromyalgia    ??? GAVE (gastric antral vascular ectasia)    ??? History of transfusion 04/2019    reports frequent blood tranfusions   ??? HTN (hypertension)    ??? Hypercholesterolemia    ??? Hypothyroid    ??? NAFLD (nonalcoholic fatty liver disease)    ??? Osteoporosis  Surgical History:  Past Surgical History:   Procedure Laterality Date   ??? APPENDECTOMY     ??? CHOLECYSTECTOMY     ??? IR TIPS  07/08/2018    IR TIPS 07/08/2018 Soledad Gerlach, MD IMG VIR H&V [facility]   ??? PR COLSC FLEXIBLE W/CONTROL BLEEDING ANY METHOD N/A 10/17/2019    Procedure: COLONOSCOPY, FLEXIBLE, PROXIMAL TO SPLENIC FLEXURE; DX, W/CONTROL OF BLEEDING;  Surgeon: Jules Husbands, MD;  Location: [facility];  Service: Gastroenterology   ??? PR RIGHT HEART CATH O2 SATURATION & CARDIAC OUTPUT Right 01/18/2018    Procedure: Right Heart Catheterization;  Surgeon: Marlaine Hind, MD;  Location: [facility] CATH;  Service: Cardiology   ??? PR UPPER GI ENDOSCOPY,CTRL BLEED N/A 03/05/2019    Procedure: UGI ENDOSCOPY; WITH CONTROL OF BLEEDING, ANY METHOD;  Surgeon: Andrey Farmer, MD;  Location: [facility];  Service: Gastroenterology   ??? PR UPPER GI ENDOSCOPY,CTRL BLEED  04/04/2019    Procedure: UGI ENDOSCOPY; WITH CONTROL OF BLEEDING, ANY METHOD;  Surgeon: Janyth Pupa, MD;  Location: [facility];  Service: Gastroenterology   ??? PR UPPER GI ENDOSCOPY,CTRL BLEED N/A 05/23/2019    Procedure: UGI ENDOSCOPY; WITH CONTROL OF BLEEDING, ANY METHOD;  Surgeon: Janyth Pupa, MD;  Location: [facility];  Service: Gastroenterology   ??? PR UPPER GI ENDOSCOPY,CTRL BLEED N/A 06/13/2019    Procedure: UGI ENDOSCOPY; WITH CONTROL OF BLEEDING, ANY METHOD;  Surgeon: Annie Paras, MD;  Location: [facility];  Service: Gastroenterology   ??? PR UPPER GI ENDOSCOPY,CTRL BLEED N/A 07/11/2019    Procedure: UGI ENDOSCOPY; WITH CONTROL OF BLEEDING, ANY METHOD;  Surgeon: Annie Paras, MD;  Location: [facility];  Service: Gastroenterology   ??? PR UPPER GI ENDOSCOPY,CTRL BLEED N/A 09/16/2019    Procedure: UGI ENDOSCOPY; WITH CONTROL OF BLEEDING, ANY METHOD;  Surgeon: Beverly Milch, MD;  Location: [facility];  Service: Gastrointestinal   ??? PR UPPER GI ENDOSCOPY,CTRL BLEED N/A 10/07/2019    Procedure: UGI ENDOSCOPY; WITH CONTROL OF BLEEDING, ANY METHOD;  Surgeon: Pia Mau, MD;  Location: [facility];  Service: Gastroenterology   ??? PR UPPER GI ENDOSCOPY,CTRL BLEED N/A 01/02/2020    Procedure: UGI ENDOSCOPY; WITH CONTROL OF BLEEDING, ANY METHOD;  Surgeon: Janyth Pupa, MD;  Location: [facility];  Service: Gastroenterology   ??? PR UPPER GI ENDOSCOPY,CTRL BLEED N/A 04/09/2020    Procedure: UGI ENDOSCOPY; WITH CONTROL OF BLEEDING, ANY METHOD;  Surgeon: Janyth Pupa, MD;  Location: [facility];  Service: Gastroenterology   ??? PR UPPER GI ENDOSCOPY,DIAGNOSIS N/A 03/03/2019    Procedure: UGI ENDO, INCLUDE ESOPHAGUS, STOMACH, & DUODENUM &/OR JEJUNUM; DX W/WO COLLECTION SPECIMN, BY BRUSH OR WASH;  Surgeon: Luanne Bras, MD;  Location: [facility]URES [facility];  Service: Gastroenterology   ??? PR UPPER GI ENDOSCOPY,LIGAT VARIX N/A 01/02/2020    Procedure: UGI ENDO; Everlene Balls LIG ESOPH &/OR GASTRIC VARICES;  Surgeon: Janyth Pupa, MD;  Location: [facility]URES [facility];  Service: Gastroenterology   ??? PR UPPER GI ENDOSCOPY,LIGAT VARIX N/A 04/09/2020    Procedure: UGI ENDO; W/BAND LIG ESOPH &/OR GASTRIC VARICES; Surgeon: Janyth Pupa, MD;  Location: [facility]URES [facility];  Service: Gastroenterology   ??? PR UPPER GI ENDOSCOPY,LIGAT VARIX N/A 06/18/2020    Procedure: UGI ENDO; Everlene Balls LIG ESOPH &/OR GASTRIC VARICES;  Surgeon: Janyth Pupa, MD;  Location: GI  PROCEDURES [facility];  Service: Gastroenterology   ??? SALPINGOOPHORECTOMY         Social History:  Social History     Socioeconomic History   ??? Marital status: Married     Spouse name: Not on file   ??? Number of children: Not on file   ??? Years of education: Not on file   ??? Highest education level: Not on file   Occupational History   ??? Not on file   Tobacco Use   ??? Smoking status: Never Smoker   ??? Smokeless tobacco: Never Used   Vaping Use   ??? Vaping Use: Never used   Substance and Sexual Activity   ??? Alcohol use: No   ??? Drug use: No   ??? Sexual activity: Not on file   Other Topics Concern   ??? Not on file   Social History Narrative   ??? Not on file     Social Determinants of Health     Financial Resource Strain:    ??? Difficulty of Paying Living Expenses:    Food Insecurity:    ??? Worried About Programme researcher, broadcasting/film/video in the Last Year:    ??? Barista in the Last Year:    Transportation Needs:    ??? Freight forwarder (Medical):    ??? Lack of Transportation (Non-Medical):    Physical Activity:    ??? Days of Exercise per Week:    ??? Minutes of Exercise per Session:    Stress:    ??? Feeling of Stress :    Social Connections:    ??? Frequency of Communication with Friends and Family:    ??? Frequency of Social Gatherings with Friends and Family:    ??? Attends Religious Services:    ??? Database administrator or Organizations:    ??? Attends Banker Meetings:    ??? Marital Status:         Family History:  History reviewed. No pertinent family history.    Review of Systems:  A 12 point review of systems was obtained and is negative besides what is included in the HPI.    Objective:   Physical Exam:  Temp:  [37.4 ??C] 37.4 ??C  Heart Rate:  [85] 85  SpO2 Pulse:  [77-83] 77  Resp:  [20] 20  BP: (112-137)/(37-52) 112/52  SpO2:  [95 %-100 %] 95 %    Gen: Patient in no acute distress, lethargic but responsive  HEENT: atraumatic, normocephalic, sclera anicteric, extraocular movements intact, pupils equal and reactive to light and accomodation  Neck: no cervical lymphadenopathy or thyromegaly, no JVD  Heart: RRR, S1, S2, no murmers, rubs, or gallops, no chest wall tenderness  Lungs: Clear to auscultation bilaterally, no crackles or wheezes, no use of accessory muscles  GI: Some tenderness in the RLQ to deep palpation, no rebound or fluid wave noted  Extremities: no clubbing, cyanosis, or edema: pulses are +2 in bilateral upper and lower extremities  Neuro: CN II-XI grossly intact, normal cerebellar function, normal gait. No focal deficits.  Skin:  Mild jaundice  Psych: Normal mood and affect.     Labs/Studies:  Labs and Studies from the last 24hrs per EMR and Reviewed    Imaging: Radiology studies were personally reviewed      Charise Killian, MD  Internal Medicine Resident, PGY-2  Pager #: [phone]

## 2020-07-12 NOTE — Unmapped (Signed)
Problem: Adult Inpatient Plan of Care  Goal: Absence of Hospital-Acquired Illness or Injury  Intervention: Identify and Manage Fall Risk  Recent Flowsheet Documentation  Taken 07/12/2020 0444 by Walker Shadow, RN  Safety Interventions:   bed alarm   environmental modification   fall reduction program maintained   lighting adjusted for tasks/safety   low bed   nonskid shoes/slippers when out of bed   room near unit station  Intervention: Prevent Skin Injury  Recent Flowsheet Documentation  Taken 07/12/2020 0423 by Walker Shadow, RN  Skin Protection: incontinence pads utilized  Intervention: Prevent and Manage VTE (Venous Thromboembolism) Risk  Recent Flowsheet Documentation  Taken 07/12/2020 0444 by Walker Shadow, RN  Activity Management: activity adjusted per tolerance  Taken 07/12/2020 0423 by Walker Shadow, RN  VTE Prevention/Management:   ambulation promoted   anticoagulant therapy   bleeding precautions maintained   bleeding risk factors identified  Intervention: Prevent Infection  Recent Flowsheet Documentation  Taken 07/12/2020 0444 by Walker Shadow, RN  Infection Prevention:   environmental surveillance performed   hand hygiene promoted   single patient room provided     Problem: Skin Injury Risk Increased  Goal: Skin Health and Integrity  Intervention: Optimize Skin Protection  Recent Flowsheet Documentation  Taken 07/12/2020 0423 by Walker Shadow, RN  Pressure Reduction Techniques: frequent weight shift encouraged  Pressure Reduction Devices: pressure-redistributing mattress utilized  Skin Protection: incontinence pads utilized     Problem: Fall Injury Risk  Goal: Absence of Fall and Fall-Related Injury  Intervention: Promote Injury-Free Environment  Recent Flowsheet Documentation  Taken 07/12/2020 0444 by Walker Shadow, RN  Safety Interventions:   bed alarm   environmental modification   fall reduction program maintained   lighting adjusted for tasks/safety   low bed   nonskid shoes/slippers when out of bed   room near unit station

## 2020-07-12 NOTE — Unmapped (Signed)
Adult Nutrition Assessment Note    Visit Type: RN Consult  Reason for Visit: Per Admission Nutrition Screen (Adult), Have you gained or lost 10 pounds in the past 3 months?      HPI & PMH:  64 y.o. female with a PMHx of cirrhosis secondary to NAFLD, GAVE, hypothyroidism, type 2 diabetes????that presented to [facility]??with altered mental status.    Anthropometric Data:  Height: 160 cm (5' 3)   Last recorded weight: 91.9 kg (202 lb 9.6 oz)  Admission weight: 91.9 kg (202 lb 9.6 oz)  IBW: 52.23 kg  Percent IBW: 175.95 %  BMI: Body mass index is 35.89 kg/m??.   Weight changes this admission:   Last 5 Recorded Weights    07/12/20 0423   Weight: 91.9 kg (202 lb 9.6 oz)      Weight history PTA:   Wt Readings from Last 10 Encounters:   07/12/20 91.9 kg (202 lb 9.6 oz)   06/18/20 81.6 kg (180 lb)   04/10/20 88.2 kg (194 lb 7.1 oz)   02/19/20 81.2 kg (179 lb)   02/19/20 81.3 kg (179 lb 4.8 oz)   01/02/20 82.1 kg (181 lb)   12/22/19 82 kg (180 lb 12.8 oz)   11/03/19 80.5 kg (177 lb 6.4 oz)   10/17/19 78 kg (172 lb)   08/12/19 78.6 kg (173 lb 3.2 oz)        Nutrition Focused Physical Exam:    Nutrition Evaluation  Overall Impressions: Nutrition-Focused Physical Exam not indicated due to lack of malnutrition risk factors. (07/12/20 1614)      NUTRITIONALLY RELEVANT DATA     Medications, labs, and hospital course reviewed.      Food and Nutrition History: Reports good/fair appetite and PO intakes PTA, she endorces weight gain/fluctuations with fluid changes. She would prefer to focus and meals/solid foods rather than ensure at this time    Current Nutrition:  Oral intake        Nutrition Orders   (From admission, onward)             Start     Ordered    07/11/20 2127  Nutrition Therapy Regular/House  Effective now     Question:  Nutrition Therapy:  Answer:  Regular/House    07/11/20 2127                 Malnutrition Assessment using AND/ASPEN Clinical Characteristics:    Patient does not meet AND/ASPEN criteria for malnutrition at this time (07/12/20 1614)         NUTRITION ASSESSMENT     No nutritional concerns of note identified at this time.     NUTRITION INTERVENTIONS and RECOMMENDATION     Recommend continue current nutrition therapy   Offer preferred protein drink prn  No nutrition intervention needed at this time.   Please reconsult if condition changes.     [address] MS, RD, LD, CNSC  [phone]

## 2020-07-12 NOTE — Unmapped (Addendum)
Marilyn French is a 64 y.o. female with a PMHx of cirrhosis secondary to NAFLD, GAVE, hypothyroidism, type 2 diabetes that presented to [facility] with altered mental status.    Altered Mental Status - Hepatic Encephalopathy  ***    Elevated Creatinine  ***    Decompensated Cirrhosis I Non Alcoholic Fatty Liver Disease I S/p TIPS 2019  ***    Type 2 Diabetes Mellitus  ***    ??  Gastric Antral Vascular Ectasia I Anemia  ***

## 2020-07-12 NOTE — Unmapped (Signed)
OCCUPATIONAL THERAPY  Evaluation (07/12/20 1300)    Patient Name:  Marilyn French       Medical Record Number: [id number]   Date of Birth: [birth year]  Sex: Female          OT Treatment Diagnosis:  altered mental status and deconditioning affecting pt's ability to complete ADLs.    Assessment  Problem List: Decreased strength, Decreased cognition, Decreased safety awareness, Impaired judgement, Decreased mobility, Fall Risk, Impaired ADLs  Assessment: Pt is a 64 yo F admitted after sliding out of bed. Pt presents with decreased mental status and hepatic encephalopathy. Pt with occupational deficits in ADLs, functional transfers, and functional mobility this date. Pt currently experienced deconditioning and weakness affecting her ability to compete self-car and mobility independently. Pt reports having a good support at home and that husband is able to assist with ADLs as needed. Based on consideration of pt's occupational profile, assessment review, level of clinical decision making involved, and intervention plan, this pt is considered to be a moderate complexity case.  Today's Interventions: supine <> sit, sit <> stand, LB dressing, grooming, and functional mobility. OT educating pt re: safe transfer techniques, safety awareness, home DME, post-acute recs, role of OT, and OT POC.    Activity Tolerance During Today's Session  Tolerated treatment well    Plan  Planned Frequency of Treatment:  1-2x per day for: 3-4x week     Planned Interventions:  Adaptive equipment, Balance activities, Bed mobility, Compensatory tech. training, Conservation, Education - Patient, Functional mobility, Environmental support, Endurance activities, ADL retraining, Education - Family / caregiver, Home exercise program, Functional cognition, Safety education, Therapeutic exercise, UE Strength / coordination exercise, Transfer training    Post-Discharge Occupational Therapy Recommendations:  3x weekly   OT DME Recommendations: None (Pt has all needed DME)    GOALS:   Patient and Family Goals: none stated    Long Term Goal #1: Pt will score 18+/24 on AMPAC in 4 weeks       Short Term:  Pt will complete full body dressing with set-up   Time Frame : 2 weeks  Pt will complete toilet transfers with set-up + LRAD   Time Frame : 2 weeks  Pt will complete grooming with set-up while standing at sink   Time Frame : 2 weeks     Prognosis:  Good  Positive Indicators:  support from family  Barriers to Discharge: Functional strength deficits, Cognitive deficits    Subjective  Current Status Pt recieved and left supine in bed with call bell and all needs in reach. RN aware. bed alarm on.  Prior Functional Status Pt unable to provide accurate social history 2/2 altered mental status. Per Epic, Pt reports that PTA she was independent with ADL and mobilty using a cane. Spouse is home with pt to assist as needed. Pt is a retired Char[employer] fundraiser. She states she ha increased difficulty attending to tasks which has impacted her ability to engage in activities she enjoys, especially reading. Pt endorsing recent falls.    Medical Tests / Procedures: Reviewed in Epic.       Patient / Caregiver reports: I wasn't getting around at home    Past Medical History:   Diagnosis Date   ??? Cirrhosis (CMS-HCC)    ??? Depression    ??? Diabetes mellitus (CMS-HCC)    ??? Fibromyalgia    ??? GAVE (gastric antral vascular ectasia)    ??? History of transfusion 04/2019    reports frequent blood tranfusions   ???  HTN (hypertension)    ??? Hypercholesterolemia    ??? Hypothyroid    ??? NAFLD (nonalcoholic fatty liver disease)    ??? Osteoporosis     Social History     Tobacco Use   ??? Smoking status: Never Smoker   ??? Smokeless tobacco: Never Used   Substance Use Topics   ??? Alcohol use: No      Past Surgical History:   Procedure Laterality Date   ??? APPENDECTOMY     ??? CHOLECYSTECTOMY     ??? IR TIPS  07/08/2018    IR TIPS 07/08/2018 Soledad Gerlach, MD IMG VIR H&V [facility]   ??? PR COLSC FLEXIBLE W/CONTROL BLEEDING ANY METHOD N/A 10/17/2019    Procedure: COLONOSCOPY, FLEXIBLE, PROXIMAL TO SPLENIC FLEXURE; DX, W/CONTROL OF BLEEDING;  Surgeon: Jules Husbands, MD;  Location: [facility];  Service: Gastroenterology   ??? PR RIGHT HEART CATH O2 SATURATION & CARDIAC OUTPUT Right 01/18/2018    Procedure: Right Heart Catheterization;  Surgeon: Marlaine Hind, MD;  Location: [facility] C[facility];  Service: Cardiology   ??? PR UPPER GI ENDOSCOPY,CTRL BLEED N/A 03/05/2019    Procedure: UGI ENDOSCOPY; WITH CONTROL OF BLEEDING, ANY METHOD;  Surgeon: Andrey Farmer, MD;  Location: [facility]URES [facility];  Service: Gastroenterology   ??? PR UPPER GI ENDOSCOPY,CTRL BLEED  04/04/2019    Procedure: UGI ENDOSCOPY; WITH CONTROL OF BLEEDING, ANY METHOD;  Surgeon: Janyth Pupa, MD;  Location: GI PROCEDURES [facility];  Service: Gastroenterology   ??? PR UPPER GI ENDOSCOPY,CTRL BLEED N/A 05/23/2019    Procedure: UGI ENDOSCOPY; WITH CONTROL OF BLEEDING, ANY METHOD;  Surgeon: Janyth Pupa, MD;  Location: GI PROCEDURES [facility];  Service: Gastroenterology   ??? PR UPPER GI ENDOSCOPY,CTRL BLEED N/A 06/13/2019    Procedure: UGI ENDOSCOPY; WITH CONTROL OF BLEEDING, ANY METHOD;  Surgeon: Annie Paras, MD;  Location: GI PROCEDURES [facility];  Service: Gastroenterology   ??? PR UPPER GI ENDOSCOPY,CTRL BLEED N/A 07/11/2019    Procedure: UGI ENDOSCOPY; WITH CONTROL OF BLEEDING, ANY METHOD;  Surgeon: Annie Paras, MD;  Location: GI PROC[facility]URES [facility];  Service: Gastroenterology   ??? PR UPPER GI ENDOSCOPY,CTRL BLEED N/A 09/16/2019    Procedure: UGI ENDOSCOPY; WITH CONTROL OF BLEEDING, ANY METHOD;  Surgeon: Beverly Milch, MD;  Location: GI PROCEDURES [facility];  Service: Gastrointestinal   ??? PR UPPER GI ENDOSCOPY,CTRL BLEED N/A 10/07/2019    Procedure: UGI ENDOSCOPY; WITH CONTROL OF BLEEDING, ANY METHOD;  Surgeon: Pia Mau, MD;  Location: [facility] PROCEDURES [facility];  Service: Gastroenterology   ??? PR UPPER GI ENDOSCOPY,CTRL BLEED N/A 01/02/2020    Procedure: UGI ENDOSCOPY; WITH CONTROL OF BLEEDING, ANY METHOD;  Surgeon: Janyth Pupa, MD;  Location: GI PROC[facility]URES [facility];  Service: Gastroenterology   ??? PR UPPER GI ENDOSCOPY,CTRL BLEED N/A 04/09/2020    Procedure: UGI ENDOSCOPY; WITH CONTROL OF BLEEDING, ANY METHOD;  Surgeon: Janyth Pupa, MD;  Location: [facility] PROC[facility]URES [facility];  Service: Gastroenterology   ??? PR UPPER GI ENDOSCOPY,DIAGNOSIS N/A 03/03/2019    Procedure: UGI ENDO, INCLUDE ESOPHAGUS, STOMACH, & DUODENUM &/OR JEJUNUM; DX W/WO COLLECTION SPECIMN, BY BRUSH OR WASH;  Surgeon: Luanne Bras, MD;  Location: GI PROC[facility]URES [facility];  Service: Gastroenterology   ??? PR UPPER GI ENDOSCOPY,LIGAT VARIX N/A 01/02/2020    Procedure: UGI ENDO; Everlene Balls LIG ESOPH &/OR GASTRIC VARICES;  Surgeon: Janyth Pupa, MD;  Location: [facility]URES [facility];  Service: Gastroenterology   ???  PR UPPER GI ENDOSCOPY,LIGAT VARIX N/A 04/09/2020    Procedure: UGI ENDO; Everlene Balls LIG ESOPH &/OR GASTRIC VARICES;  Surgeon: Janyth Pupa, MD;  Location: [facility]URES [facility];  Service: Gastroenterology   ??? PR UPPER GI ENDOSCOPY,LIGAT VARIX N/A 06/18/2020    Procedure: UGI ENDO; Everlene Balls LIG ESOPH &/OR GASTRIC VARICES;  Surgeon: Janyth Pupa, MD;  Location: GI PROCEDURES [facility];  Service: Gastroenterology   ??? SALPINGOOPHORECTOMY      History reviewed. No pertinent family history.     Ferumoxytol, Definity [perflutren lipid microspheres], and Tramadol hcl     Objective Findings  Precautions / Restrictions  Falls precautions    Weight Bearing  Non-applicable    Required Braces or Orthoses  Non-applicable    Pain  Pt c/o 4/10 headache. RN aware    Equipment / Environment  Patient not wearing mask for full session, Vascular access (PIV, TLC, Port-a-cath, PICC)    Living Situation  Living Environment: House  Lives With: Spouse  Home Living: Two level home, Able to Live on main level with bedroom/bathroom, Built-in shower seat, Grab bars in shower, Raised toilet seat with rails, Stairs to enter with rails, Walk-in shower  Rail placement (outside): Bilateral rails  Number of Stairs to Enter (outside): 4  Equipment available at home: Gilmer Mor, Firefighter, Bedside commode     Cognition   Orientation Level:  Disoriented to place, Disoriented to situation   Arousal/Alertness:  Appropriate responses to stimuli   Attention Span:  Difficulty dividing attention   Memory:  Decreased recall of biographical information, Decreased recall of recent events   Following Commands:  Follows multistep commands with increased time   Safety Judgment:  Decreased awareness of need for assistance      Vision / Perception      Vision: No visual deficits      Hand Function   B grip strength WFL    Skin Inspection  all visible skin intact    ROM / Strength/Coordination  UE ROM/ Strength/ Coordination: B UE grossly WFL  LE ROM/ Strength/ Coordination: B LE appear functionally weak    Sensation:  no c/o numbness/tingling    Balance:  supervision for static and dynamic sitting balance; CGA for static and dynamic standing balance without an AD    Functional Mobility  Transfer Assistance Needed: Yes  Transfers - Needs Assistance:  (sit <> stand with CGA )  Bed Mobility Assistance Needed: Yes  Bed Mobility - Needs Assistance:  (supine <> sit with SBA)  Ambulation: Pt completed functional mobility at EOB with CGA and no AD.    ADLs  ADLs: Needs assistance with ADLs  ADLs - Needs Assistance: Grooming, Bathing, Toileting, UB dressing, LB dressing  Grooming - Needs Assistance:  (SBA)  Bathing - Needs Assistance:  (SBA)  Toileting - Needs Assistance:  (SBA)  UB Dressing - Needs Assistance:  (SBA)  LB Dressing - Needs Assistance:  (SBA while seated)    Vitals / Orthostatics  At Rest: NAD  With Activity: NAD  Orthostatics: asymptomatic     Medical Staff Made Aware: Lurena Joiner, RN updated and aware    Occupational Therapy Session Duration  OT Individual [mins]: 39       I attest that I have reviewed the above information.  Signed: Bonnita Nasuti, OT  Filed 07/12/2020

## 2020-07-12 NOTE — Unmapped (Signed)
Internal Medicine (MEDW) Progress Note    Assessment & Plan:   Marilyn French is a 64 y.o. female with a PMHx of cirrhosis secondary to NAFLD, GAVE, hypothyroidism, type 2 diabetes  that presented to [facility] with altered mental status.    Altered Mental Status - Hepatic Encephalopathy  Multiple episodes of hepatic encephalopathy in the past, on lactulose and rifaximin at home. She is also s/p TIPS procedure (2019), which increases her risk of HE. No clear etiology at this time, but diagnostic testing has been reassuring. Favor medication effect at this time due to changes in lactulose dosing at home.   - CT head normal   - CXR normal   - TSH and vit B12 normal   - Continue lactulose 30 mg TID and rifaximin BID   - Urine toxicology pending    Elevated Creatinine  Baseline creatinine this year appears to be between 1-1.2, though has been slowly increasing. Patient did not respond to 25 mg Albumin x 2 overnight. Possible AKI vs new baseline.   - Third dose 25 g Albumin   - Holding diuretics   - Avoid nephrotoxins   - Encourage PO intake    Decompensated Cirrhosis I Non Alcoholic Fatty Liver Disease I S/p TIPS 2019  Follows with transplant hepatology. Decompensated by the history of hepatic encephalopathy and ascites, though she appears to have been stable from the ascites perspective on home diuresis. She underwent a TIPS on 07/08/18.   - Abdominal US with minimal RUQ fluid.  - Holding diuretics in the setting of elevated creatinine for now  - Daily MELD labs    Type 2 Diabetes Mellitus  Last A1c on 02/19/20 at 5.4%. Takes lantus solostar 55 units in the morning and 10 units in the evening. Initially decreased dose to 35 U daily due to AMS, which is resolving.   - Will increase Lantus dose   - Sliding scale insulin    Gastric Antral Vascular Ectasia I Anemia  Likely secondary to cirrhosis. Is chronically anemic and intermittently requires blood transfusions. Last EGD on 06/18/20 re-demonstrated GAVE with portal gastropathy and grade 1 esophageal varices. Hemoglobin of 8.6 on admission which appears stable. Iron low on admission at 39 ug/dL  - IV iron today  - Trend H&H, daily CBC  - Transfuse for Hgb <7    MELD-Na score: 23 at 07/12/2020  6:34 AM  MELD score: 22 at 07/12/2020  6:34 AM  Calculated from:  Serum Creatinine: 1.51 mg/dL at 27/11/5007  3:81 AM  Serum Sodium: 135 mmol/L at 07/12/2020  6:34 AM  Total Bilirubin: 3.2 mg/dL at 82/99/3716  9:67 AM  INR(ratio): 1.91 at 07/11/2020  1:38 PM  Age: 65 years    Principal Problem:    Hepatic encephalopathy (CMS-HCC)  Active Problems:    NAFLD (nonalcoholic fatty liver disease)    Cirrhosis (CMS-HCC)    GAVE (gastric antral vascular ectasia)    Hypothyroid    Type 2 diabetes mellitus without complication, with long-term current use of insulin (CMS-HCC)  Resolved Problems:    * No resolved hospital problems. *    Chronic Problems:    HFpEF: last Echo 12/2019 with mild dilation of left and right atria. Grade 1 intrapulmonary shunt 2/2 cirrosis. Holding diuretics, as stated above.    Hypothyroidism: home levothyroxine      Daily Checklist:  Diet: Regular Diet  DVT PPx: Contraindicated 2/2 thrombocytopenia <50k  Electrolytes: No Repletion Needed  Code Status: Full Code  Dispo: Pending clinical course.  Team Contact Information:   Primary Team: Internal Medicine (MEDW)  Primary Resident: Becky Sax, DO  Resident's Pager: [phone] (Gen MedW Intern - [place] Asters)    Interval History:   NAEON. 1 BM overnight. No subjective concerns this morning.  States she is feeling better.  Worked with PT and OT over the course of the morning    ROS: Denies headache, chest pain, shortness of breath, abdominal pain, nausea, vomiting.    Objective:   Temp:  [36.5 ??C (97.7 ??F)-36.8 ??C (98.2 ??F)] 36.5 ??C (97.7 ??F)  Heart Rate:  [73-77] 77  SpO2 Pulse:  [71-79] 72  Resp:  [18-19] 18  BP: (108-132)/(37-63) 120/44  SpO2:  [95 %-99 %] 97 %    Gen: Elderly woman in NAD. Lethargic. AAO x 3.  Eyes: sclera anicteric, EOMI  HENT: atraumatic, MMM, OP w/o erythema or exudate   Heart: Regular rate, systolic ejection murmur RUSB, no chest wall tenderness, no appreciable JVD  Lungs: CTAB, no crackles or wheezes, no use of accessory muscles  Abdomen: Normoactive bowel sounds, soft, NTND, no rebound/guarding  Extremities: no clubbing, cyanosis, or edema in the BLEs  Psych: Alert, oriented, appropriate mood and affect    Labs/Studies: Labs and Studies from the last 24hrs per EMR and Reviewed    ============================================    Mosetta Anis, MS3    I attest that I have reviewed the medical student note and that the components of the history of the present illness, the physical exam, and the assessment and plan documented were performed by me or were performed in my presence by the student where I verified the documentation and performed (or re-performed) the exam and medical decision making. I agree with the student's documentation above, and have made changes, where appropriate.      Stephenie Acres, DO  PGY-1 Anesthesiology

## 2020-07-12 NOTE — Unmapped (Addendum)
Social Work  Psychosocial Assessment      CM met with patient in pt room. Pt was not, Visitor was wearing hospital provided masks for the duration of the interaction with CM.   CM was wearing hospital provided surgical mask and hospital provided eye protection.  CM was not within 6 foot of the patient/visitors during this interaction.     Per H&P: Shaketha Jeon is a 64 y.o. female with PMHx of cirrhosis secondary to NAFLD, GAVE, hypothyroidism, type 2 diabetes  that presented to [facility] with altered mental status.    SW met pt at bedside. Pt's daughter Danella Sensing was also at bedside and gave majority of the provided information. SW lives with husband Sion Thane and reports having 3 steps at entry way to home. Per daughter everything pt needs is on the first level of home.Daugther reports pt having HH a while ago and would like to confirm with pt's husband who she had services with. Pt has a walker and a cane at home and reports possibly having a bedside commode.  Pt and daughter were agreeable to pt having HH in the home and would like to go with the agency they previously used.  Pt confirmed her spouse Kerianne Gurr as her HCDM. SW will continue to monitor pt's DC needs.  Patient Name: Tyiana Hill   Medical Record Number: [id number]   Date of Birth: 10/02/1955  Sex: Female     Referral  Referred by: Social Worker  Reason for Referral: Complex Discharge Planning    Extended Emergency Contact Information  Primary Emergency Contact: Clinton  Address: [address]           [place], [place] [address] [place]  Home Phone: [phone]  Mobile Phone: [phone]  Relation: Spouse  Preferred language: ENGLISH  Interpreter needed? No  Secondary Emergency Contact: Danella Sensing  Work Phone: [phone]  Relation: Daughter  Preferred language: ENGLISH  Interpreter needed? No    Legal Next of Kin / Guardian / POA / Advance Directives      Advance Directive (Medical Treatment)  Does patient have an advance directive covering medical treatment?: Patient does not have advance directive covering medical treatment.  Reason patient does not have an advance directive covering medical treatment:: Patient does not wish to complete one at this time.    Health Care Decision Maker [HCDM] (Medical & Mental Health Treatment)  Healthcare Decision Maker: HCDM documented in the HCDM/Contact Info section.  Information offered on HCDM, Medical & Mental Health advance directives:: Patient declined information.    Advance Directive (Mental Health Treatment)  Does patient have an advance directive covering mental health treatment?: Patient does not have advance directive covering mental health treatment.  Reason patient does not have an advance directive covering mental health treatment:: HCDM documented in the HCDM/Contact Info section.    Discharge Planning  Discharge Planning Information:   Type of Residence   Mailing Address:  [address]  [place] [address]    Medical Information   Past Medical History:   Diagnosis Date   ??? Cirrhosis (CMS-HCC)    ??? Depression    ??? Diabetes mellitus (CMS-HCC)    ??? Fibromyalgia    ??? GAVE (gastric antral vascular ectasia)    ??? History of transfusion 04/2019    reports frequent blood tranfusions   ??? HTN (hypertension)    ??? Hypercholesterolemia    ??? Hypothyroid    ??? NAFLD (nonalcoholic fatty liver disease)    ???  Osteoporosis        Past Surgical History:   Procedure Laterality Date   ??? APPENDECTOMY     ??? CHOLECYSTECTOMY     ??? IR TIPS  07/08/2018    IR TIPS 07/08/2018 Soledad Gerlach, MD IMG VIR H&V [facility]   ??? PR COLSC FLEXIBLE W/CONTROL BLEEDING ANY METHOD N/A 10/17/2019    Procedure: COLONOSCOPY, FLEXIBLE, PROXIMAL TO SPLENIC FLEXURE; DX, W/CONTROL OF BLEEDING;  Surgeon: Jules Husbands, MD;  [facility]: [facility];  Service: Gastroenterology   ??? PR RIGHT HEART CATH O2 SATURATION & CARDIAC OUTPUT Right 01/18/2018    Procedure: Right Heart Catheterization;  Surgeon: Marlaine Hind, MD;  Location: [facility] CATH;  Service: Cardiology   ??? PR UPPER GI ENDOSCOPY,CTRL BLEED N/A 03/05/2019    Procedure: UGI ENDOSCOPY; WITH CONTROL OF BLEEDING, ANY METHOD;  Surgeon: Andrey Farmer, MD;  [facility]: [facility]  Service: Gastroenterology   ??? PR UPPER GI ENDOSCOPY,CTRL BLEED  04/04/2019    Procedure: UGI ENDOSCOPY; WITH CONTROL OF BLEEDING, ANY METHOD;  Surgeon: Janyth Pupa, MD;  Location: [facility];  Service: Gastroenterology   ??? PR UPPER GI ENDOSCOPY,CTRL BLEED N/A 05/23/2019    Procedure: UGI ENDOSCOPY; WITH CONTROL OF BLEEDING, ANY METHOD;  Surgeon: Janyth Pupa, MD;  Location: [facility];  Service: Gastroenterology   ??? PR UPPER GI ENDOSCOPY,CTRL BLEED N/A 06/13/2019    Procedure: UGI ENDOSCOPY; WITH CONTROL OF BLEEDING, ANY METHOD;  Surgeon: Annie Paras, MD;  Location: [facility];  Service: Gastroenterology   ??? PR UPPER GI ENDOSCOPY,CTRL BLEED N/A 07/11/2019    Procedure: UGI ENDOSCOPY; WITH CONTROL OF BLEEDING, ANY METHOD;  Surgeon: Annie Paras, MD;  [facility]: [facility];  Service: Gastroenterology   ??? PR UPPER GI ENDOSCOPY,CTRL BLEED N/A 09/16/2019    Procedure: UGI ENDOSCOPY; WITH CONTROL OF BLEEDING, ANY METHOD;  Surgeon: Beverly Milch, MD;  [facility];  Service: Gastrointestinal   ??? PR UPPER GI ENDOSCOPY,CTRL BLEED N/A 10/07/2019    Procedure: UGI ENDOSCOPY; WITH CONTROL OF BLEEDING, ANY METHOD;  Surgeon: Pia Mau, MD;  Location: [facility];  Service: Gastroenterology   ??? PR UPPER GI ENDOSCOPY,CTRL BLEED N/A 01/02/2020    Procedure: UGI ENDOSCOPY; WITH CONTROL OF BLEEDING, ANY METHOD;  Surgeon: Janyth Pupa, MD;  Location: [facility];  Service: Gastroenterology   ??? PR UPPER GI ENDOSCOPY,CTRL BLEED N/A 04/09/2020    Procedure: UGI ENDOSCOPY; WITH CONTROL OF BLEEDING, ANY METHOD;  Surgeon: Janyth Pupa, MD;  Location: [facility];  Service: Gastroenterology   ??? PR UPPER GI ENDOSCOPY,DIAGNOSIS N/A 03/03/2019    Procedure: UGI ENDO, INCLUDE ESOPHAGUS, STOMACH, & DUODENUM &/OR JEJUNUM; DX W/WO COLLECTION SPECIMN, BY BRUSH OR WASH;  Surgeon: Luanne Bras, MD;  Location: [facility];  Service: Gastroenterology   ??? PR UPPER GI ENDOSCOPY,LIGAT VARIX N/A 01/02/2020    Procedure: UGI ENDO; Everlene Balls LIG ESOPH &/OR GASTRIC VARICES;  Surgeon: Janyth Pupa, MD;  Location: [facility];  Service: Gastroenterology   ??? PR UPPER GI ENDOSCOPY,LIGAT VARIX N/A 04/09/2020    Procedure: UGI ENDO; W/BAND LIG ESOPH &/OR GASTRIC VARICES;  Surgeon: Janyth Pupa, MD;  [facility];  Service: Gastroenterology   ??? PR UPPER GI ENDOSCOPY,LIGAT VARIX N/A 06/18/2020    Procedure: UGI ENDO; W/BAND LIG ESOPH &/OR GASTRIC VARICES;  Surgeon: Gaspar Garbe  Verne Carrow, MD;  Location: [facility];  Service: Gastroenterology   ??? SALPINGOOPHORECTOMY         History reviewed. No pertinent family history.    Financial Information   Primary Insurance: Payor: Manufacturing engineer / Plan: HEALTH TEAM ADVANTAGE / Product Type: *No Product type* /    Secondary Insurance: None   Prescription Coverage: Nurse, learning disability (listed above)   Preferred Pharmacy: [id number] - [place] - [address]    Barriers to taking medication: No    Transition Home   Transportation at time of discharge: Family/Friend's Private Vehicle    Anticipated changes related to Illness: TBD   Services in place prior to admission: N/A   Services anticipated for DC: Home Based Services: HOME HEALTH   Hemodialysis Prior to Admission: No    Readmission  Risk of Unplanned Readmission Score: UNPLANNED READMISSION SCORE: 22%  Readmitted Within the Last 30 Days?   Readmission Factors include: unable to assess    Social Determinants of Health  Social Determinants of Health     Tobacco Use: Low Risk    ??? Smoking Tobacco Use: Never Smoker   ??? Smokeless Tobacco Use: Never Used   Alcohol Use:    ??? How often do you have a drink containing alcohol?:    ??? How many drinks containing alcohol do you have on a typical day when you are drinking?:    ??? How often do you have 5 or more drinks on one occasion?:    Financial Resource Strain: Low Risk    ??? Difficulty of Paying Living Expenses: Not hard at all   Food Insecurity: No Food Insecurity   ??? Worried About Programme researcher, broadcasting/film/video in the Last Year: Never true   ??? Ran Out of Food in the Last Year: Never true   Transportation Needs: No Transportation Needs   ??? Lack of Transportation (Medical): No   ??? Lack of Transportation (Non-Medical): No   Physical Activity:    ??? Days of Exercise per Week:    ??? Minutes of Exercise per Session:    Stress:    ??? Feeling of Stress :    Social Connections:    ??? Frequency of Communication with Friends and Family:    ??? Frequency of Social Gatherings with Friends and Family:    ??? Attends Religious Services:    ??? Database administrator or Organizations:    ??? Attends Engineer, structural:    ??? Marital Status:    Intimate Programme researcher, broadcasting/film/video Violence:    ??? Fear of Current or Ex-Partner:    ??? Emotionally Abused:    ??? Physically Abused:    ??? Sexually Abused:    Depression: Not at risk   ??? PHQ-2 Score: 0   Housing/Utilities: Low Risk    ??? Within the past 12 months, have you ever stayed: outside, in a car, in a tent, in an overnight shelter, or temporarily in someone else's home (i.e. couch-surfing)?: No   ??? Are you worried about losing your housing?: No   ??? Within the past 12 months, have you been unable to get utilities (heat, electricity) when it was really needed?: No   Substance Use:    ??? Taken prescription drugs for non-medical reasons:    ??? Taken illegal drugs:    ??? Patient indicated they have taken drugs in the past year for non-medical reasons: Yes, [positive answer(s)]:    Health Literacy:    ??? :  Social History  Support Systems/Concerns: Dustin [phone]   [phone]  Lalena Salas   [phone]                                          Medical and Psychiatric History  Psychosocial Stressors: Coping with health challenges/recent hospitalization                     Chemical Dependency: None              Outpatient Providers: Primary Care Provider   Name / Contact #: : Doreen Beam  Legal: No legal issues      Ability to Access Community Services: No issues accessing community services

## 2020-07-13 ENCOUNTER — Encounter (HOSPITAL_COMMUNITY): Admission: RE | Admit: 2020-07-13 | Payer: PPO | Source: Ambulatory Visit

## 2020-07-13 LAB — HEPATIC FUNCTION PANEL
ALBUMIN: 2.4 g/dL — ABNORMAL LOW (ref 3.4–5.0)
ALKALINE PHOSPHATASE: 89 U/L (ref 46–116)
AST (SGOT): 21 U/L (ref <=34)
BILIRUBIN DIRECT: 1.1 mg/dL — ABNORMAL HIGH (ref 0.00–0.30)
BILIRUBIN TOTAL: 2 mg/dL — ABNORMAL HIGH (ref 0.3–1.2)

## 2020-07-13 LAB — CBC
HEMATOCRIT: 22.5 % — ABNORMAL LOW (ref 36.0–46.0)
HEMOGLOBIN: 7.2 g/dL — ABNORMAL LOW (ref 12.0–16.0)
MEAN CORPUSCULAR HEMOGLOBIN CONC: 32.1 g/dL (ref 31.0–37.0)
MEAN CORPUSCULAR HEMOGLOBIN: 28.8 pg (ref 26.0–34.0)
MEAN CORPUSCULAR VOLUME: 89.6 fL (ref 80.0–100.0)
MEAN PLATELET VOLUME: 13.8 fL — ABNORMAL HIGH (ref 7.0–10.0)
RED BLOOD CELL COUNT: 2.52 10*12/L — ABNORMAL LOW (ref 4.00–5.20)
RED CELL DISTRIBUTION WIDTH: 18.4 % — ABNORMAL HIGH (ref 12.0–15.0)
WBC ADJUSTED: 2.5 10*9/L — ABNORMAL LOW (ref 4.5–11.0)

## 2020-07-13 LAB — BASIC METABOLIC PANEL
ANION GAP: 6 mmol/L (ref 5–14)
BLOOD UREA NITROGEN: 33 mg/dL — ABNORMAL HIGH (ref 9–23)
BUN / CREAT RATIO: 19
CHLORIDE: 110 mmol/L — ABNORMAL HIGH (ref 98–107)
CO2: 24 mmol/L (ref 20.0–31.0)
CREATININE: 1.76 mg/dL — ABNORMAL HIGH
EGFR CKD-EPI AA FEMALE: 34 mL/min/{1.73_m2} — ABNORMAL LOW (ref >=60)
EGFR CKD-EPI NON-AA FEMALE: 30 mL/min/{1.73_m2} — ABNORMAL LOW (ref >=60)
GLUCOSE RANDOM: 153 mg/dL (ref 70–179)
POTASSIUM: 4.3 mmol/L (ref 3.4–4.5)
SODIUM: 140 mmol/L (ref 135–145)

## 2020-07-13 LAB — PHOSPHORUS: Phosphate:MCnc:Pt:Ser/Plas:Qn:: 3.8

## 2020-07-13 LAB — ALKALINE PHOSPHATASE: Alkaline phosphatase:CCnc:Pt:Ser/Plas:Qn:: 89

## 2020-07-13 LAB — CALCIUM: Calcium:MCnc:Pt:Ser/Plas:Qn:: 8.2 — ABNORMAL LOW

## 2020-07-13 LAB — MAGNESIUM: Magnesium:MCnc:Pt:Ser/Plas:Qn:: 2.3

## 2020-07-13 LAB — HEMATOCRIT: Hematocrit:VFr:Pt:Bld:Qn:: 22.5 — ABNORMAL LOW

## 2020-07-13 MED ADMIN — busPIRone (BUSPAR) tablet 10 mg: 10 mg | ORAL | @ 10:00:00

## 2020-07-13 MED ADMIN — rifAXIMin (XIFAXAN) tablet 550 mg: 550 mg | ORAL | @ 01:00:00 | Stop: 2020-10-18

## 2020-07-13 MED ADMIN — rifAXIMin (XIFAXAN) tablet 550 mg: 550 mg | ORAL | @ 15:00:00 | Stop: 2020-10-18

## 2020-07-13 MED ADMIN — ondansetron (ZOFRAN-ODT) disintegrating tablet 4 mg: 4 mg | ORAL | @ 15:00:00 | Stop: 2020-07-13

## 2020-07-13 MED ADMIN — atorvastatin (LIPITOR) tablet 10 mg: 10 mg | ORAL | @ 01:00:00

## 2020-07-13 MED ADMIN — insulin lispro (HumaLOG) injection 0-12 Units: 0-12 [IU] | SUBCUTANEOUS | @ 22:00:00

## 2020-07-13 MED ADMIN — lactulose (CHRONULAC) oral solution (30 mL cup): 30 g | ORAL | @ 17:00:00

## 2020-07-13 MED ADMIN — insulin lispro (HumaLOG) injection 0-12 Units: 0-12 [IU] | SUBCUTANEOUS | @ 01:00:00

## 2020-07-13 MED ADMIN — insulin lispro (HumaLOG) injection 0-12 Units: 0-12 [IU] | SUBCUTANEOUS | @ 17:00:00

## 2020-07-13 MED ADMIN — lactulose (CHRONULAC) oral solution (30 mL cup): 30 g | ORAL | @ 15:00:00

## 2020-07-13 MED ADMIN — sertraline (ZOLOFT) tablet 50 mg: 50 mg | ORAL | @ 15:00:00

## 2020-07-13 NOTE — Unmapped (Incomplete)
Patient is currently resting in bed, VSS, A&Ox2, daughter at bedside. No acute events to report this shift. Patient is ambulating using the walker.     Problem: Adult Inpatient Plan of Care  Goal: Plan of Care Review  Outcome: Progressing  Goal: Patient-Specific Goal (Individualized)  Outcome: Progressing  Goal: Absence of Hospital-Acquired Illness or Injury  Outcome: Progressing  Intervention: Identify and Manage Fall Risk  Recent Flowsheet Documentation  Taken 07/12/2020 0800 by Helane Rima, RN  Safety Interventions:   aspiration precautions   bleeding precautions   environmental modification   fall reduction program maintained   infection management   lighting adjusted for tasks/safety   low bed   nonskid shoes/slippers when out of bed  Intervention: Prevent and Manage VTE (Venous Thromboembolism) Risk  Recent Flowsheet Documentation  Taken 07/12/2020 0800 by Helane Rima, RN  Activity Management: activity adjusted per tolerance  Intervention: Prevent Infection  Recent Flowsheet Documentation  Taken 07/12/2020 0800 by Helane Rima, RN  Infection Prevention:   environmental surveillance performed   equipment surfaces disinfected   hand hygiene promoted   rest/sleep promoted   single patient room provided  Goal: Optimal Comfort and Wellbeing  Outcome: Progressing  Goal: Readiness for Transition of Care  Outcome: Progressing  Goal: Rounds/Family Conference  Outcome: Progressing     Problem: Self-Care Deficit  Goal: Improved Ability to Complete Activities of Daily Living  Outcome: Progressing     Problem: Skin Injury Risk Increased  Goal: Skin Health and Integrity  Outcome: Progressing  Intervention: Optimize Skin Protection  Recent Flowsheet Documentation  Taken 07/12/2020 1600 by Helane Rima, RN  Pressure Reduction Techniques: frequent weight shift encouraged  Taken 07/12/2020 1400 by Helane Rima, RN  Pressure Reduction Techniques: frequent weight shift encouraged  Taken 07/12/2020 1200 by Helane Rima, RN  Pressure Reduction Techniques: frequent weight shift encouraged  Taken 07/12/2020 1000 by Helane Rima, RN  Pressure Reduction Techniques: frequent weight shift encouraged  Taken 07/12/2020 0800 by Helane Rima, RN  Pressure Reduction Techniques: frequent weight shift encouraged     Problem: Fall Injury Risk  Goal: Absence of Fall and Fall-Related Injury  Outcome: Progressing  Intervention: Promote Injury-Free Environment  Recent Flowsheet Documentation  Taken 07/12/2020 0800 by Helane Rima, RN  Safety Interventions:   aspiration precautions   bleeding precautions   environmental modification   fall reduction program maintained   infection management   lighting adjusted for tasks/safety   low bed   nonskid shoes/slippers when out of bed

## 2020-07-13 NOTE — Unmapped (Signed)
Internal Medicine (MEDW) Progress Note    Assessment & Plan:   Marilyn French is a 64 y.o. female with a PMHx of cirrhosis secondary to NAFLD, GAVE, hypothyroidism, type 2 diabetes  that presented to [facility] with altered mental status.    Altered Mental Status - Hepatic Encephalopathy  Multiple episodes of hepatic encephalopathy in the past, on lactulose and rifaximin at home. She is also s/p TIPS procedure (2019), which increases her risk of HE. No clear etiology at this time, but diagnostic testing has been reassuring. Favor medication effect at this time due to changes in lactulose dosing at home.   - CT head normal   - CXR normal   - TSH and vit B12 normal   - Continue lactulose 30 mg TID and rifaximin BID (will reinforce the need to take lactulose)  - Urine toxicology pending  - Blood cultures with no growth to date and our suspicion for sepsis is thus less likely along with her vital signs have been stable    Elevated Creatinine  Baseline creatinine this year appears to be between 1-1.2, though has been slowly increasing. Patient did not respond to 25 mg Albumin x 2 overnight. Possible AKI vs new baseline.   -Got 2 doses of albumin when she arrived and a dose on 10/25 as well; will hold off on fluids at this time as patient will be receiving 1 unit of PRBC.  -Concern for HRS and as such we will follow-up urine sodium as well as results of renal ultrasound.   - Holding diuretics   - Avoid nephrotoxins   - Encourage PO intake    Decompensated Cirrhosis I Non Alcoholic Fatty Liver Disease I S/p TIPS 2019  Follows with transplant hepatology. Decompensated by the history of hepatic encephalopathy and ascites, though she appears to have been stable from the ascites perspective on home diuresis. She underwent a TIPS on 07/08/18.   - Abdominal US with minimal RUQ fluid.  - Holding diuretics in the setting of elevated creatinine for now  - Daily MELD labs    Type 2 Diabetes Mellitus  Last A1c on 02/19/20 at 5.4%. Takes lantus solostar 55 units in the morning and 10 units in the evening. Initially decreased dose to 35 U daily due to AMS, which is resolving.   - Lantus dose increased to 45 units   - Sliding scale insulin    Gastric Antral Vascular Ectasia I Anemia  Likely secondary to cirrhosis. Is chronically anemic and intermittently requires blood transfusions. Last EGD on 06/18/20 re-demonstrated GAVE with portal gastropathy and grade 1 esophageal varices. Hemoglobin of 8.6 on admission which appears stable. Iron low on admission at 39 ug/dL  -Hemoglobin downtrending to 7.2 today, given this pattern we will transfuse her with 1 unit of blood after consenting and typing and screening; consider IV iron in the future.  - Trend H&H, daily CBC  - Transfuse for Hgb <7    MELD-Na score: 22 at 07/13/2020  5:44 AM  MELD score: 22 at 07/13/2020  5:44 AM  Calculated from:  Serum Creatinine: 1.76 mg/dL at 16/06/9603  5:40 AM  Serum Sodium: 140 mmol/L (Using max of 137 mmol/L) at 07/13/2020  5:44 AM  Total Bilirubin: 2.0 mg/dL at 98/07/9146  8:29 AM  INR(ratio): 1.91 at 07/11/2020  1:38 PM  Age: 28 years    Principal Problem:    Hepatic encephalopathy (CMS-HCC)  Active Problems:    NAFLD (nonalcoholic fatty liver disease)    Cirrhosis (CMS-HCC)  GAVE (gastric antral vascular ectasia)    Hypothyroid    Type 2 diabetes mellitus without complication, with long-term current use of insulin (CMS-HCC)  Resolved Problems:    * No resolved hospital problems. *    Chronic Problems:    HFpEF: last Echo 12/2019 with mild dilation of left and right atria. Grade 1 intrapulmonary shunt 2/2 cirrosis. Holding diuretics, as stated above.    Hypothyroidism: home levothyroxine      Daily Checklist:  Diet: Regular Diet  DVT PPx: Contraindicated 2/2 thrombocytopenia <50k  Electrolytes: No Repletion Needed  Code Status: Full Code  Dispo: Pending clinical course.    Team Contact Information:   Primary Team: Internal Medicine (MEDW)  Primary Resident: Becky Sax, DO  Resident's Pager: [phone] (Gen MedW Intern - [place]if[place]on Asters)    Interval History:   NAEON.  Patient refusing lactulose as she is afraid that she will get nauseous that she has in the past and does not want this to occur.  States that she has had some right-sided abdominal pain that was mild in intensity but prevented her from sleeping well.  Feels about the same as the day prior.  Patient does note that when she was working with PT/OT she did have some dizziness that she did not mention to the team.    ROS: Denies headache, chest pain, shortness of breath, abdominal pain, nausea, vomiting.    Objective:   Temp:  [36.5 ??C-36.8 ??C] 36.5 ??C  Heart Rate:  [68-78] 68  Resp:  [16-20] 20  BP: (112-117)/(38-46) 117/46  SpO2:  [95 %-97 %] 97 %    Gen: Elderly woman in NAD. Lethargic. AAO x 3.  Delayed when responding to questions.  Eyes: sclera anicteric, EOMI  HENT: atraumatic, normocephalic  Heart: Regular rate, systolic ejection murmur RUSB, no chest wall tenderness, no appreciable JVD  Lungs: CTAB, no crackles or wheezes, no use of accessory muscles  Abdomen: Normoactive bowel sounds, soft, mildly tender on the right upper and right lower quadrant with deep palpation and moderately distended, no rebound/guarding  Extremities: no clubbing, cyanosis, or appreciable edema in the BLEs  Psych: Alert, oriented, appropriate mood and affect    Labs/Studies: Labs and Studies from the last 24hrs per EMR and Reviewed    ============================================    Daris Harkins Peggye Fothergill, DO  PGY-1 Anesthesiology

## 2020-07-13 NOTE — Unmapped (Signed)
Pt resting in bed, eyes closed. A&Ox4, but responses noted to be delayed. VSS. No ASD noted. Scheduled meds administered per order. Refused lactulose, but encouraged pt to take. Up to bedside commode w/ assist. Bed in low, call bell within reach. WCTM.     Problem: Adult Inpatient Plan of Care  Goal: Plan of Care Review  Outcome: Progressing  Goal: Patient-Specific Goal (Individualized)  Outcome: Progressing  Goal: Absence of Hospital-Acquired Illness or Injury  Outcome: Progressing  Intervention: Identify and Manage Fall Risk  Recent Flowsheet Documentation  Taken 07/12/2020 2000 by Nile Dear, RN  Safety Interventions:   commode/urinal/bedpan at bedside   fall reduction program maintained   lighting adjusted for tasks/safety   low bed   nonskid shoes/slippers when out of bed   aspiration precautions   bleeding precautions  Intervention: Prevent Skin Injury  Recent Flowsheet Documentation  Taken 07/12/2020 2156 by Nile Dear, RN  Skin Protection:   adhesive use limited   incontinence pads utilized  Taken 07/12/2020 2000 by Nile Dear, RN  Skin Protection:   adhesive use limited   incontinence pads utilized  Intervention: Prevent and Manage VTE (Venous Thromboembolism) Risk  Recent Flowsheet Documentation  Taken 07/12/2020 2000 by Nile Dear, RN  Activity Management: activity adjusted per tolerance  Intervention: Prevent Infection  Recent Flowsheet Documentation  Taken 07/12/2020 2000 by Nile Dear, RN  Infection Prevention:   cohorting utilized   visitors restricted/screened   single patient room provided   rest/sleep promoted  Goal: Optimal Comfort and Wellbeing  Outcome: Progressing  Goal: Readiness for Transition of Care  Outcome: Progressing  Goal: Rounds/Family Conference  Outcome: Progressing     Problem: Self-Care Deficit  Goal: Improved Ability to Complete Activities of Daily Living  Outcome: Progressing     Problem: Skin Injury Risk Increased  Goal: Skin Health and Integrity  Outcome: Progressing  Intervention: Optimize Skin Protection  Recent Flowsheet Documentation  Taken 07/12/2020 2156 by Nile Dear, RN  Pressure Reduction Techniques: frequent weight shift encouraged  Pressure Reduction Devices: pressure-redistributing mattress utilized  Skin Protection:   adhesive use limited   incontinence pads utilized  Taken 07/12/2020 2000 by Nile Dear, RN  Pressure Reduction Techniques: frequent weight shift encouraged  Head of Bed (HOB) Positioning: HOB elevated  Pressure Reduction Devices: pressure-redistributing mattress utilized  Skin Protection:   adhesive use limited   incontinence pads utilized     Problem: Fall Injury Risk  Goal: Absence of Fall and Fall-Related Injury  Outcome: Progressing  Intervention: Promote Scientist, clinical (histocompatibility and immunogenetics) Documentation  Taken 07/12/2020 2000 by Nile Dear, RN  Safety Interventions:   commode/urinal/bedpan at bedside   fall reduction program maintained   lighting adjusted for tasks/safety   low bed   nonskid shoes/slippers when out of bed   aspiration precautions   bleeding precautions

## 2020-07-14 LAB — HEPATIC FUNCTION PANEL
ALBUMIN: 2.3 g/dL — ABNORMAL LOW (ref 3.4–5.0)
AST (SGOT): 23 U/L (ref <=34)
BILIRUBIN DIRECT: 1 mg/dL — ABNORMAL HIGH (ref 0.00–0.30)
BILIRUBIN TOTAL: 1.8 mg/dL — ABNORMAL HIGH (ref 0.3–1.2)
PROTEIN TOTAL: 4.6 g/dL — ABNORMAL LOW (ref 5.7–8.2)

## 2020-07-14 LAB — MAGNESIUM: Magnesium:MCnc:Pt:Ser/Plas:Qn:: 2.4

## 2020-07-14 LAB — CBC
HEMATOCRIT: 25.3 % — ABNORMAL LOW (ref 36.0–46.0)
HEMOGLOBIN: 8 g/dL — ABNORMAL LOW (ref 12.0–16.0)
MEAN CORPUSCULAR HEMOGLOBIN CONC: 31.5 g/dL (ref 31.0–37.0)
MEAN CORPUSCULAR HEMOGLOBIN: 28.9 pg (ref 26.0–34.0)
MEAN CORPUSCULAR VOLUME: 91.8 fL (ref 80.0–100.0)
MEAN PLATELET VOLUME: 12.7 fL — ABNORMAL HIGH (ref 7.0–10.0)
PLATELET COUNT: 41 10*9/L — ABNORMAL LOW (ref 150–440)
WBC ADJUSTED: 2.3 10*9/L — ABNORMAL LOW (ref 4.5–11.0)

## 2020-07-14 LAB — BASIC METABOLIC PANEL
ANION GAP: 3 mmol/L — ABNORMAL LOW (ref 5–14)
BLOOD UREA NITROGEN: 32 mg/dL — ABNORMAL HIGH (ref 9–23)
BUN / CREAT RATIO: 18
CALCIUM: 8.4 mg/dL — ABNORMAL LOW (ref 8.7–10.4)
CHLORIDE: 107 mmol/L (ref 98–107)
EGFR CKD-EPI AA FEMALE: 34 mL/min/{1.73_m2} — ABNORMAL LOW (ref >=60)
EGFR CKD-EPI NON-AA FEMALE: 29 mL/min/{1.73_m2} — ABNORMAL LOW (ref >=60)
GLUCOSE RANDOM: 162 mg/dL (ref 70–179)
POTASSIUM: 4.1 mmol/L (ref 3.4–4.5)
SODIUM: 135 mmol/L (ref 135–145)

## 2020-07-14 LAB — TOXICOLOGY SCREEN, URINE
BARBITURATE SCREEN URINE: NEGATIVE
BUPRENORPHINE, URINE SCREEN: NEGATIVE
COCAINE(METAB.)SCREEN, URINE: NEGATIVE
FENTANYL SCREEN, URINE: NEGATIVE
METHADONE SCREEN, URINE: NEGATIVE
OPIATE SCREEN URINE: NEGATIVE
OXYCODONE SCREEN URINE: NEGATIVE

## 2020-07-14 LAB — PHOSPHORUS: Phosphate:MCnc:Pt:Ser/Plas:Qn:: 3.9

## 2020-07-14 LAB — CREATININE, URINE: Lab: 146

## 2020-07-14 LAB — BILIRUBIN TOTAL: Bilirubin:MCnc:Pt:Ser/Plas:Qn:: 1.8 — ABNORMAL HIGH

## 2020-07-14 LAB — SODIUM URINE: Lab: <10

## 2020-07-14 LAB — MEAN CORPUSCULAR HEMOGLOBIN: Erythrocyte mean corpuscular hemoglobin:EntMass:Pt:RBC:Qn:Automated count: 28.9

## 2020-07-14 LAB — FENTANYL SCREEN, URINE: Lab: NEGATIVE

## 2020-07-14 LAB — POTASSIUM: Potassium:SCnc:Pt:Ser/Plas:Qn:: 4.1

## 2020-07-14 LAB — UREA NITROGEN URINE: Lab: 625

## 2020-07-14 LAB — EXTRA TUBE PINK: Lab: 0

## 2020-07-14 LAB — B-TYPE NATRIURETIC PEPTIDE: Natriuretic peptide.B:MCnc:Pt:Bld:Qn:: 190 — ABNORMAL HIGH

## 2020-07-14 MED ORDER — FERROUS SULFATE 325 MG (65 MG IRON) TABLET
ORAL_TABLET | Freq: Every day | ORAL | 3 refills | 30.00000 days | Status: CP
Start: 2020-07-14 — End: 2021-07-14
  Filled 2020-07-14: qty 30, 30d supply, fill #0

## 2020-07-14 MED ORDER — LACTULOSE 10 GRAM/15 ML ORAL SOLUTION
Freq: Three times a day (TID) | ORAL | 0 refills | 30.00000 days | Status: CP
Start: 2020-07-14 — End: 2020-08-13

## 2020-07-14 MED ORDER — DICLOFENAC 1 % TOPICAL GEL
Freq: Four times a day (QID) | TOPICAL | 0 refills | 13.00000 days | Status: CN | PRN
Start: 2020-07-14 — End: 2021-07-14

## 2020-07-14 MED ORDER — BUMETANIDE 2 MG TABLET
Freq: Two times a day (BID) | ORAL | 0 days
Start: 2020-07-14 — End: 2020-08-13

## 2020-07-14 MED ADMIN — levothyroxine (SYNTHROID) tablet 25 mcg: 25 ug | ORAL | @ 09:00:00 | Stop: 2020-07-14

## 2020-07-14 MED ADMIN — lactulose (CHRONULAC) oral solution (30 mL cup): 30 g | ORAL | @ 01:00:00

## 2020-07-14 MED ADMIN — insulin glargine (LANTUS) injection 45 Units: 45 [IU] | SUBCUTANEOUS | @ 12:00:00 | Stop: 2020-07-14

## 2020-07-14 MED ADMIN — calcium carbonate (TUMS) chewable tablet 200 mg of elem calcium: 200 mg | ORAL | @ 19:00:00 | Stop: 2020-07-14

## 2020-07-14 MED ADMIN — insulin lispro (HumaLOG) injection 0-12 Units: 0-12 [IU] | SUBCUTANEOUS | @ 16:00:00 | Stop: 2020-07-14

## 2020-07-14 MED ADMIN — busPIRone (BUSPAR) tablet 10 mg: 10 mg | ORAL | @ 09:00:00 | Stop: 2020-07-14

## 2020-07-14 MED FILL — FEROSUL 325 MG (65 MG IRON) TABLET: 30 days supply | Qty: 30 | Fill #0 | Status: AC

## 2020-07-14 NOTE — Unmapped (Signed)
Pt resting in bed, eyes closed. A&Ox4. Response time is improved but still slightly delayed. VSS. No ASD noted. Meds administered per order. Pt had one bowel movement over night. Urine sample sent. Tolerated solid meal. Up w/ standby assist. Bed in low, call bell within reach. WCTM.    Problem: Adult Inpatient Plan of Care  Goal: Plan of Care Review  Outcome: Progressing  Goal: Patient-Specific Goal (Individualized)  Outcome: Progressing  Goal: Absence of Hospital-Acquired Illness or Injury  Outcome: Progressing  Intervention: Identify and Manage Fall Risk  Recent Flowsheet Documentation  Taken 07/13/2020 2000 by Nile Dear, RN  Safety Interventions:   commode/urinal/bedpan at bedside   fall reduction program maintained   family at bedside   lighting adjusted for tasks/safety   low bed   nonskid shoes/slippers when out of bed   bed alarm   bleeding precautions  Intervention: Prevent Skin Injury  Recent Flowsheet Documentation  Taken 07/13/2020 2100 by Nile Dear, RN  Skin Protection:   adhesive use limited   incontinence pads utilized  Intervention: Prevent and Manage VTE (Venous Thromboembolism) Risk  Recent Flowsheet Documentation  Taken 07/13/2020 2000 by Nile Dear, RN  Activity Management: activity adjusted per tolerance  Intervention: Prevent Infection  Recent Flowsheet Documentation  Taken 07/13/2020 2000 by Nile Dear, RN  Infection Prevention: cohorting utilized  Goal: Optimal Comfort and Wellbeing  Outcome: Progressing  Goal: Readiness for Transition of Care  Outcome: Progressing  Goal: Rounds/Family Conference  Outcome: Progressing     Problem: Self-Care Deficit  Goal: Improved Ability to Complete Activities of Daily Living  Outcome: Progressing     Problem: Skin Injury Risk Increased  Goal: Skin Health and Integrity  Outcome: Progressing  Intervention: Optimize Skin Protection  Recent Flowsheet Documentation  Taken 07/13/2020 2100 by Nile Dear, RN  Pressure Reduction Techniques: frequent weight shift encouraged  Pressure Reduction Devices: pressure-redistributing mattress utilized  Skin Protection:   adhesive use limited   incontinence pads utilized  Taken 07/13/2020 2000 by Nile Dear, RN  Head of Bed [facility]) Positioning: HOB at 30-45 degrees     Problem: Fall Injury Risk  Goal: Absence of Fall and Fall-Related Injury  Outcome: Progressing  Intervention: Promote Injury-Free Environment  Recent Flowsheet Documentation  Taken 07/13/2020 2000 by Nile Dear, RN  Safety Interventions:   commode/urinal/bedpan at bedside   fall reduction program maintained   family at bedside   lighting adjusted for tasks/safety   low bed   nonskid shoes/slippers when out of bed   bed alarm   bleeding precautions

## 2020-07-14 NOTE — Unmapped (Signed)
Physician Discharge Summary [facility]  1 [facility] OBSERVATION [facility]  [address]  [place] [address]  Dept: [phone]  Loc: [phone]     Identifying Information:   Marilyn French  [birth year]  [id number]    Primary Care Physician: Ignatius Specking, MD     Referring Physician: Clementeen Graham     Code Status: Full Code    Admit Date: 07/11/2020    Discharge Date: 07/14/2020     Discharge To: Home with Home Health and/or PT/OT    Discharge Service: [facility] - General Medicine Floor Team (MEDW)     Discharge Attending Physician: Clementeen Graham, MD    Discharge Diagnoses:  Principal Problem:    Hepatic encephalopathy (CMS-HCC)  Active Problems:    NAFLD (nonalcoholic fatty liver disease)    Cirrhosis (CMS-HCC)    GAVE (gastric antral vascular ectasia)    Hypothyroid    Type 2 diabetes mellitus without complication, with long-term current use of insulin (CMS-HCC)  Resolved Problems:    * No resolved hospital problems. *      Outpatient Provider Follow Up Issues:   1. Anemia (see below) - patient will be scheduled for labs on Friday 10/29 that will need follow up    Hospital Course:   Julia Alkhatib is a 64 y.o. female with a PMHx of cirrhosis secondary to NAFLD, GAVE, hypothyroidism, type 2 diabetes that presented to [facility] with altered mental status 2/2 hepatic encephalopathy.    Altered Mental Status - Hepatic Encephalopathy  Altered mental status resolved on HD1. Believed to be medication effect secondary to not taking her lactulose as prescribed considering resolution after continuing lactulose in the hospital. Her blood cultures were also negative at 48 hours. Ms. Stepanian will be discharged on the same regimen of lactulose 30 mg TID and rifaximin 550 mg BID. Ms. Garnett will be going home with home health nursing to help with medication adherence and to monitor fluid status.    Elevated Creatinine  Creatine has been fluctuating over the past year from 0.95 to 1.22 prior to admission. Ms. Traynham was admitted with a creatinine of 1.45. It was not responsive to fluids (25g albumin x 2) and elevated to 1.76. It remained stable at 1.79 after 1 U pRBCs (see below) and appeared to be in a plateau. She will be sent home with strict instructions for repeat lab work in 2 days and PCP follow up next week.     Type 2 Diabetes Mellitus  Patient is on Lantus 55 U daily at home. Reduced to 35 U on admission due to altered mental status and reduced oral intake and was increased to 45 U after her AMS resolved and she was eating. Last A1c was 5.4% 02/19/20. Ms. Newhard had excellent glucose control during her admission. No indications for changing.  ??  Gastric Antral Vascular Ectasia I Anemia  Ms. Scioli has a history of chronic transfusion for GAVE. Last EGD 06/18/20 with portal gastropathy and grade 1 esophageal varices. Hemoglobin on admission was 8.6, which slowly down trended during her admission to 7.2. She received 1 U pRBCs on HD2 and responded appropriately and will be discharged on HD3 with a hemoglobin of 8.0.    Procedures:  None  No admission procedures for hospital encounter.  ______________________________________________________________________  Discharge Medications:     Your Medication List      START taking these medications    ferrous sulfate 325 (65 FE) MG tablet  Take 1 tablet (325 mg  total) by mouth daily.        CHANGE how you take these medications    lactulose 10 gram/15 mL solution  Commonly known as: CHRONULAC  Take 45 mL (30 g total) by mouth Three (3) times a day.  What changed:   ?? how much to take  ?? when to take this        CONTINUE taking these medications    bumetanide 2 MG tablet  Commonly known as: BUMEX  Take 0.5 tablets (1 mg total) by mouth two (2) times a day.     busPIRone 10 MG tablet  Commonly known as: BUSPAR  Take 1 tablet by mouth twice daily     cholecalciferol-25 mcg (1,000 unit) 1,000 unit (25 mcg) tablet  Generic drug: cholecalciferol (vitamin D3 25 mcg (1,000 units))  Take 1,000 Units by mouth every other day.     flintstones complete tablet  Generic drug: pediatric multivit-iron-min  Chew 2 tablets daily.     insulin lispro 100 unit/mL injection  Commonly known as: HumaLOG  INJECT 15 TO 35 UNITS SUBCUTANEUOSLY PER SLIDING SCALE 3 TIMES DAILY     LANTUS SOLOSTAR U-100 INSULIN 100 unit/mL (3 mL) injection pen  Generic drug: insulin glargine  Inject 0.45 mL (45 Units total) under the skin daily.     levothyroxine 25 MCG tablet  Commonly known as: SYNTHROID  Take 25 mcg by mouth daily.     MAGNESIUM ORAL  Take by mouth.     pen needle, diabetic 31 gauge x 1/4 (6 mm) Ndle     potassium chloride 20 MEQ CR tablet  Commonly known as: KLOR-CON  Take 20 mEq by mouth daily.     rifAXIMin 550 mg Tab  Commonly known as: XIFAXAN  Take 1 tablet (550 mg total) by mouth Two (2) times a day.     rosuvastatin 5 MG tablet  Commonly known as: CRESTOR  Take 5 mg by mouth every other day.     sertraline 50 MG tablet  Commonly known as: ZOLOFT  Take 1 tablet (50 mg total) by mouth daily.     spironolactone 100 MG tablet  Commonly known as: ALDACTONE  Take 1 tablet (100 mg total) by mouth daily.     ZINC ACETATE ORAL  Take by mouth.            Allergies:  Ferumoxytol, Definity [perflutren lipid microspheres], and Tramadol hcl  ______________________________________________________________________  Pending Test Results (if blank, then none):  Pending Labs     Order Current Status    Blood Culture #1 Preliminary result    Blood Culture #2 Preliminary result        Most Recent Labs:  All lab results last 24 hours -   Recent Results (from the past 24 hour(s))   Prepare RBC    Collection Time: 07/13/20  4:43 PM   Result Value Ref Range    Crossmatch Compatible     Unit Blood Type O Neg     ISBT Number 9500     Unit # [id number]     Status Ready     Spec Expiration [id number]     Product ID Red Blood Cells     PRODUCT CODE [id number]    POCT Glucose    Collection Time: 07/13/20  5:19 PM Result Value Ref Range    Glucose, POC 241 (H) 70 - 179 mg/dL   POCT Glucose    Collection Time: 07/13/20  8:11 PM  Result Value Ref Range    Glucose, POC 189 (H) 70 - 179 mg/dL   Sodium, Random Urine    Collection Time: 07/14/20  2:34 AM   Result Value Ref Range    Sodium, Ur <10 mmol/L   Toxicology Screen, Urine    Collection Time: 07/14/20  2:38 AM   Result Value Ref Range    Amphetamines Screen, Ur Negative <500 ng/mL    Barbiturates Screen, Ur Negative <200 ng/mL    Benzodiazepines Screen, Urine Negative <200 ng/mL    Cannabinoids Screen, Ur Negative <20 ng/mL    Methadone Screen, Urine Negative <300 ng/mL    Cocaine(Metab.)Screen, Urine Negative <150 ng/mL    Opiates Screen, Ur Negative <300 ng/mL    Fentanyl Screen, Ur Negative <1.0 ng/mL    Oxycodone Screen, Ur Negative <100 ng/mL    Buprenorphine, Urine Negative <5 ng/mL   Creatinine, Urine    Collection Time: 07/14/20  2:38 AM   Result Value Ref Range    Creat U 146.0 Undefined mg/dL   Urea Nitrogen, Random Urine    Collection Time: 07/14/20  2:38 AM   Result Value Ref Range    Urea Nitrogen, Ur 625 Undefined mg/dL   CBC    Collection Time: 07/14/20  5:17 AM   Result Value Ref Range    WBC 2.3 (L) 4.5 - 11.0 10*9/L    RBC 2.76 (L) 4.00 - 5.20 10*12/L    HGB 8.0 (L) 12.0 - 16.0 g/dL    HCT 29.5 (L) 62.1 - 46.0 %    MCV 91.8 80.0 - 100.0 fL    MCH 28.9 26.0 - 34.0 pg    MCHC 31.5 31.0 - 37.0 g/dL    RDW 30.8 (H) 65.7 - 15.0 %    MPV 12.7 (H) 7.0 - 10.0 fL    Platelet 41 (L) 150 - 440 10*9/L   Basic Metabolic Panel    Collection Time: 07/14/20  5:17 AM   Result Value Ref Range    Sodium 135 135 - 145 mmol/L    Potassium 4.1 3.4 - 4.5 mmol/L    Chloride 107 98 - 107 mmol/L    CO2 25.0 20.0 - 31.0 mmol/L    Anion Gap 3 (L) 5 - 14 mmol/L    BUN 32 (H) 9 - 23 mg/dL    Creatinine 8.46 (H) 0.55 - 1.02 mg/dL    BUN/Creatinine Ratio 18     EGFR CKD-EPI Non-African American, Female 29 (L) >=60 mL/min/1.34m2    EGFR CKD-EPI African American, Female 34 (L) >=60 mL/min/1.9m2    Glucose 162 70 - 179 mg/dL    Calcium 8.4 (L) 8.7 - 10.4 mg/dL   Hepatic Function Panel    Collection Time: 07/14/20  5:17 AM   Result Value Ref Range    Albumin 2.3 (L) 3.4 - 5.0 g/dL    Total Protein 4.6 (L) 5.7 - 8.2 g/dL    Total Bilirubin 1.8 (H) 0.3 - 1.2 mg/dL    Bilirubin, Direct 9.62 (H) 0.00 - 0.30 mg/dL    AST 23 <=95 U/L    ALT 18 10 - 49 U/L    Alkaline Phosphatase 91 46 - 116 U/L   Magnesium Level    Collection Time: 07/14/20  5:17 AM   Result Value Ref Range    Magnesium 2.4 1.6 - 2.6 mg/dL   Phosphorus Level    Collection Time: 07/14/20  5:17 AM   Result Value Ref Range    Phosphorus 3.9  2.4 - 5.1 mg/dL   B-type Natriuretic Peptide    Collection Time: 07/14/20  7:27 AM   Result Value Ref Range    BNP 190 (H) <=100 pg/mL   POCT Glucose    Collection Time: 07/14/20  7:53 AM   Result Value Ref Range    Glucose, POC 106 70 - 179 mg/dL   POCT Glucose    Collection Time: 07/14/20 11:46 AM   Result Value Ref Range    Glucose, POC 193 (H) 70 - 179 mg/dL       Relevant Studies/Radiology (if blank, then none):    ______________________________________________________________________  Discharge Instructions:     Ms. Tool,    It was great taking care of you in the hospital. Below are instructions for going home:    MEDICATIONS  - Please continue taking all home medications with the following exceptions:   - Increase your lactulose from 20 mg four times daily to 30 mg three times daily   - You have been prescribed an iron tablet (ferrous sulfate 325 mg) - take one tablet daily   - You have been prescribed Bumex - this is a diuretic (water pill) - take 1/2 tablet daily      FOLLOW UP   - You have an appointment with Dr. Karilyn Cota Monday November 1st at 3:15 PM for a follow up.   - You will need lab work that needs to be done on Friday 10/29. This can be done at any [facility] facility with a lab. Take these results to your appointment on Monday for interpretation.  ______________________________________________________________________  Discharge Day Services:  BP 120/40  - Pulse 68  - Temp 36.9 ??C (Oral)  - Resp 16  - Ht 160 cm (5' 3)  - Wt 91.9 kg (202 lb 9.6 oz)  - LMP  (LMP Unknown)  - SpO2 97%  - BMI 35.89 kg/m??   Pt seen on the day of discharge and determined appropriate for discharge.    Condition at Discharge: good    Length of Discharge: I spent greater than 30 mins in the discharge of this patient.      Charise Killian, MD  Internal Medicine Resident, PGY-2  Pager #: [phone]

## 2020-07-14 NOTE — Unmapped (Signed)
Patient refused lactulose, but received all other scheduled medications. Ambulated to [facility] SBA, tolerated regular diet. A&Ox4, bed locked and in lowest position, call bell within reach. VSS. Will continue to monitor.   Problem: Adult Inpatient Plan of Care  Goal: Plan of Care Review  Outcome: Progressing  Goal: Patient-Specific Goal (Individualized)  Outcome: Progressing  Goal: Absence of Hospital-Acquired Illness or Injury  Outcome: Progressing  Intervention: Identify and Manage Fall Risk  Recent Flowsheet Documentation  Taken 07/14/2020 0750 by Kris Hartmann, RN  Safety Interventions:   commode/urinal/bedpan at bedside   environmental modification   fall reduction program maintained   lighting adjusted for tasks/safety   low bed   nonskid shoes/slippers when out of bed   bed alarm  Intervention: Prevent Skin Injury  Recent Flowsheet Documentation  Taken 07/14/2020 0750 by Kris Hartmann, RN  Skin Protection: adhesive use limited  Intervention: Prevent and Manage VTE (Venous Thromboembolism) Risk  Recent Flowsheet Documentation  Taken 07/14/2020 0750 by Kris Hartmann, RN  Activity Management: activity adjusted per tolerance  VTE Prevention/Management: ambulation promoted  Intervention: Prevent Infection  Recent Flowsheet Documentation  Taken 07/14/2020 0750 by Kris Hartmann, RN  Infection Prevention: cohorting utilized  Goal: Optimal Comfort and Wellbeing  Outcome: Progressing  Goal: Readiness for Transition of Care  Outcome: Progressing  Goal: Rounds/Family Conference  Outcome: Progressing

## 2020-07-15 ENCOUNTER — Encounter (HOSPITAL_COMMUNITY)
Admission: RE | Admit: 2020-07-15 | Discharge: 2020-07-15 | Disposition: A | Discharge status: Home / Self Care | Payer: PPO | Source: Ambulatory Visit | Attending: Internal Medicine | Admitting: Internal Medicine

## 2020-07-15 ENCOUNTER — Other Ambulatory Visit: Payer: Self-pay

## 2020-07-15 ENCOUNTER — Other Ambulatory Visit (INDEPENDENT_AMBULATORY_CARE_PROVIDER_SITE_OTHER): Payer: Self-pay | Admitting: Internal Medicine

## 2020-07-15 ENCOUNTER — Telehealth (INDEPENDENT_AMBULATORY_CARE_PROVIDER_SITE_OTHER): Payer: Self-pay

## 2020-07-15 DIAGNOSIS — D500 Iron deficiency anemia secondary to blood loss (chronic): Secondary | ICD-10-CM

## 2020-07-15 DIAGNOSIS — K7581 Nonalcoholic steatohepatitis (NASH): Secondary | ICD-10-CM | POA: Diagnosis not present

## 2020-07-15 LAB — HEMOGLOBIN AND HEMATOCRIT, BLOOD
HCT: 27.5 % — ABNORMAL LOW (ref 36.0–46.0)
Hemoglobin: 8.3 g/dL — ABNORMAL LOW (ref 12.0–15.0)

## 2020-07-15 MED ORDER — HEPARIN SOD (PORK) LOCK FLUSH 100 UNIT/ML IV SOLN: 500.0000 [IU] | Freq: Once | INTRAVENOUS | Status: DC

## 2020-07-15 MED ORDER — HEPARIN SOD (PORK) LOCK FLUSH 100 UNIT/ML IV SOLN
500.0000 [IU] | Freq: Once | INTRAVENOUS | Status: AC
  Administered 2020-07-15: 500 [IU] via INTRAVENOUS

## 2020-07-15 NOTE — Telephone Encounter (Signed)
Talked with Renda Rolls RN , Patient will have PT/INR , C-Met, and H&H. Patient was to have has these labs drawn on 07/12/2020 but she was inpatient at [facility].

## 2020-07-15 NOTE — Progress Notes (Signed)
Patient and her husband showed up at day clinic today wanting blood work done. Patient was discharged from [facility] yesterday.  Her hemoglobin was 8 g. Will proceed with H&H. If hemoglobin is low she will have to go to emergency room for transfusion. Not sure why patient did not receive another unit of PRBCs while at [facility]. I have been advised by them to keep her hemoglobin around 8.5 g.

## 2020-07-15 NOTE — Telephone Encounter (Signed)
Tomi Myers,RN called to say that she got into Care Everywhere. Upon reviewing the lab work , it was noted that all the requested labs had been drawn yesterday at [facility]. Patient and husband will be made aware, and orders for lab work will be done for next week. Dr.Rehman will be made aware that those labs are in [place] for his review.

## 2020-07-15 NOTE — Telephone Encounter (Signed)
Mr Sons is calling stating that Maria [name] needs to go to the Lab today and she needs a Hgb and looks like [name] needs a CMP and he wants to take her to Lab at [facility] to have labs drawn through her port he is heading over today she has a follow up appointment here Monday, Please advise?

## 2020-07-15 NOTE — Unmapped (Signed)
Pt verbalized understanding of all discharge education instructions.  Problem: Adult Inpatient Plan of Care  Goal: Plan of Care Review  07/14/2020 1845 by Kris Hartmann, RN  Outcome: Resolved  07/14/2020 1504 by Kris Hartmann, RN  Outcome: Progressing  Goal: Patient-Specific Goal (Individualized)  07/14/2020 1845 by Kris Hartmann, RN  Outcome: Resolved  07/14/2020 1504 by Kris Hartmann, RN  Outcome: Progressing  Goal: Absence of Hospital-Acquired Illness or Injury  07/14/2020 1845 by Kris Hartmann, RN  Outcome: Resolved  07/14/2020 1504 by Kris Hartmann, RN  Outcome: Progressing  Intervention: Identify and Manage Fall Risk  Recent Flowsheet Documentation  Taken 07/14/2020 0750 by Kris Hartmann, RN  Safety Interventions:   commode/urinal/bedpan at bedside   environmental modification   fall reduction program maintained   lighting adjusted for tasks/safety   low bed   nonskid shoes/slippers when out of bed   bed alarm  Intervention: Prevent Skin Injury  Recent Flowsheet Documentation  Taken 07/14/2020 0750 by Kris Hartmann, RN  Skin Protection: adhesive use limited  Intervention: Prevent and Manage VTE (Venous Thromboembolism) Risk  Recent Flowsheet Documentation  Taken 07/14/2020 0750 by Kris Hartmann, RN  Activity Management: activity adjusted per tolerance  VTE Prevention/Management: ambulation promoted  Intervention: Prevent Infection  Recent Flowsheet Documentation  Taken 07/14/2020 0750 by Kris Hartmann, RN  Infection Prevention: cohorting utilized  Goal: Optimal Comfort and Wellbeing  07/14/2020 1845 by Kris Hartmann, RN  Outcome: Resolved  07/14/2020 1504 by Kris Hartmann, RN  Outcome: Progressing  Goal: Readiness for Transition of Care  07/14/2020 1845 by Kris Hartmann, RN  Outcome: Resolved  07/14/2020 1504 by Kris Hartmann, RN  Outcome: Progressing  Goal: Rounds/Family Conference  07/14/2020 1845 by Kris Hartmann, RN  Outcome: Resolved  07/14/2020 1504 by Kris Hartmann, RN  Outcome: Progressing

## 2020-07-16 DIAGNOSIS — I10 Essential (primary) hypertension: Secondary | ICD-10-CM | POA: Diagnosis not present

## 2020-07-16 DIAGNOSIS — E119 Type 2 diabetes mellitus without complications: Secondary | ICD-10-CM | POA: Diagnosis not present

## 2020-07-19 ENCOUNTER — Telehealth (INDEPENDENT_AMBULATORY_CARE_PROVIDER_SITE_OTHER): Payer: Self-pay | Admitting: *Deleted

## 2020-07-19 ENCOUNTER — Encounter (HOSPITAL_COMMUNITY)
Admission: RE | Admit: 2020-07-19 | Discharge: 2020-07-19 | Disposition: A | Discharge status: Home / Self Care | Payer: PPO | Source: Ambulatory Visit | Attending: Internal Medicine | Admitting: Internal Medicine

## 2020-07-19 ENCOUNTER — Ambulatory Visit (INDEPENDENT_AMBULATORY_CARE_PROVIDER_SITE_OTHER): Payer: PPO | Admitting: Gastroenterology

## 2020-07-19 ENCOUNTER — Other Ambulatory Visit: Payer: Self-pay

## 2020-07-19 ENCOUNTER — Encounter (INDEPENDENT_AMBULATORY_CARE_PROVIDER_SITE_OTHER): Payer: Self-pay | Admitting: Gastroenterology

## 2020-07-19 VITALS — BP 130/68 | HR 75 | Temp 98.0°F | Ht 63.0 in | Wt 198.5 lb

## 2020-07-19 DIAGNOSIS — K7290 Hepatic failure, unspecified without coma: Secondary | ICD-10-CM

## 2020-07-19 DIAGNOSIS — K7682 Hepatic encephalopathy: Secondary | ICD-10-CM

## 2020-07-19 DIAGNOSIS — K7460 Unspecified cirrhosis of liver: Secondary | ICD-10-CM | POA: Diagnosis not present

## 2020-07-19 DIAGNOSIS — R600 Localized edema: Secondary | ICD-10-CM | POA: Diagnosis not present

## 2020-07-19 DIAGNOSIS — K7581 Nonalcoholic steatohepatitis (NASH): Secondary | ICD-10-CM | POA: Insufficient documentation

## 2020-07-19 LAB — SAMPLE TO BLOOD BANK

## 2020-07-19 LAB — HEMOGLOBIN AND HEMATOCRIT, BLOOD
HCT: 28.8 % — ABNORMAL LOW (ref 36.0–46.0)
Hemoglobin: 8.8 g/dL — ABNORMAL LOW (ref 12.0–15.0)

## 2020-07-19 MED ORDER — METOLAZONE 2.5 MG PO TABS: 2.5000 mg | ORAL_TABLET | ORAL | 2 refills | Status: DC | PRN

## 2020-07-19 MED ORDER — HEPARIN SOD (PORK) LOCK FLUSH 100 UNIT/ML IV SOLN
500.0000 [IU] | Freq: Once | INTRAVENOUS | Status: AC
  Administered 2020-07-19: 500 [IU] via INTRAVENOUS

## 2020-07-19 NOTE — Progress Notes (Signed)
[name], M.D. Gastroenterology & Hepatology [facility] [address] [place], [place] [address]  Primary Care Physician: [place], [name] [id number]  I will communicate my assessment and recommendations to the referring MD via EMR. "Note: Occasional unusual wording and randomly placed punctuation marks may result from the use of speech recognition technology to transcribe this document"  Problems: 1. Decompensated NASH cirrhosis 2. Hepatic encephalopathy 3. History of variceal bleeding 4. Recurrent ascites status post TIPS  History of Present Illness: Maria [name] is a 64 y.o. female with past medical history of NASH cirrhosis complicated by recurrent hepatic encephalopathy, ascites status post TIPS placement, history of variceal bleeding requiring banding, portal hypertensive gastropathy and GAVE, depression, diabetes, hyperlipidemia, hypertension, hypothyroidism, osteoporosis, who presents for follow up after recent hospitalization for hepatic encephalopathy.  The patient was admitted to [facility] on 07/11/2020 after presenting hepatic encephalopathy in the setting of decreased intake of lactulose.  She had a negative infectious work-up.  She was noted to have a drop in her hemoglobin to 7.2 without presence of active gastrointestinal bleeding for which she received 1 unit of PRBC with a repeat hemoglobin of 8.0.  She was also noted to have an increase in her creatinine to 1.76 from a baseline of less than 1.22.  Patient was discharged home on a higher dose of lactulose and Xifaxan, while continuing her zinc intake.  Also, she had a repeat hemoglobin drawn today but the result is not back yet.  Patient comes with her husband who provides most of the information.  She states since discharged from the hospital, she has been still confused and can't recall all the events that happened in the hospital.   However, her husband endorses her symptoms have been better controlled with less confusion compared to prior. Patient is currently having at least 3 BMs per day on lactulose 40 mg TID. She is also taking Xifaxan 550 mg BID. Patient is on metolazone 2.5 mg every other day and spironolactone 100 mg qday, has not presented any abdominal distention but they reports she has been retaining more fluid in her lower extremities.  She reports feeling slightly nauseated but has not presented any vomiting. Denies having fever, chills, hematochezia, melena, hematemesis, abdominal distention, abdominal pain, diarrhea, jaundice, pruritus or weight loss.  Notably, the patient obtained financial clearance for surgery but husband states she is waiting to increase her chances as her MELD is not very high and she has significant frailty that has precluded her to become priority in the liver transplant list.  Last EGD: 06/18/2020 - grade 1 EV - s/p laser treatment, portal HTN gastropathy.  Past Medical History: Past Medical History:  Diagnosis Date   Anemia    CHF (congestive heart failure) (HCC)    Cirrhosis (HCC)    Depression    Diabetes mellitus    Esophageal bleed, non-variceal    Eye hemorrhage    Behind left eye   Fibromyalgia    Hypercholesteremia    Hypertension    Hypothyroidism    NAFLD (nonalcoholic fatty liver disease)    Osteoarthrosis    Osteoporosis    PONV (postoperative nausea and vomiting)    UTI (urinary tract infection) 11/12 end of the month   Patient feels that she passed a kidney stone at that time    Past Surgical History: Past Surgical History:  Procedure Laterality Date   [place]  SALPINGOOPHORECTOMY     CHOLECYSTECTOMY     COLONOSCOPY  03/15/2011   COLONOSCOPY N/A 03/24/2016   Procedure: COLONOSCOPY;  Surgeon: [name], MD;  [facility];  Service: Endoscopy;  Laterality: N/A;  855   ESOPHAGEAL BANDING  04/24/2012    Procedure: ESOPHAGEAL BANDING;  Surgeon: [name], MD;  [facility];  Service: Endoscopy;  Laterality: N/A;   Esophageal BANDING  08/03/2012   [facility] in [place] , [place]  09/24/2012   Procedure: ESOPHAGEAL BANDING;  Surgeon: [name], MD;  [facility]: [facility];  Service: Endoscopy;  Laterality: N/A;  Banding x 3   ESOPHAGEAL BANDING N/A 01/29/2013   Procedure: ESOPHAGEAL BANDING;  Surgeon: [name], MD;  [facility];  Service: Endoscopy;  Laterality: N/A;   ESOPHAGEAL BANDING N/A 06/19/2014   Procedure: ESOPHAGEAL BANDING;  Surgeon: [name], MD;  [facility];  Service: Endoscopy;  Laterality: N/A;   ESOPHAGEAL BANDING N/A 01/05/2016   Procedure: ESOPHAGEAL BANDING;  Surgeon: [name], MD;  [facility];  Service: Endoscopy;  Laterality: N/A;   ESOPHAGEAL BANDING N/A 08/03/2016   Procedure: ESOPHAGEAL BANDING;  Surgeon: [name], MD;  [facility];  Service: Endoscopy;  Laterality: N/A;   ESOPHAGEAL BANDING N/A 05/02/2017   Procedure: ESOPHAGEAL BANDING;  Surgeon: [name], MD;  [facility];  Service: Endoscopy;  Laterality: N/A;   ESOPHAGOGASTRODUODENOSCOPY  04/24/2012   Procedure: ESOPHAGOGASTRODUODENOSCOPY (EGD);  Surgeon: [name], MD;  [facility];  Service: Endoscopy;  Laterality: N/A;  300   ESOPHAGOGASTRODUODENOSCOPY N/A 01/29/2013   Procedure: ESOPHAGOGASTRODUODENOSCOPY (EGD);  Surgeon: [name], MD;  [facility];  Service: Endoscopy;  Laterality: N/A;  1200   ESOPHAGOGASTRODUODENOSCOPY N/A 05/22/2013   Procedure: ESOPHAGOGASTRODUODENOSCOPY (EGD);  Surgeon: [name], MD;  [facility];  Service: Endoscopy;  Laterality: N/A;  1:55   ESOPHAGOGASTRODUODENOSCOPY N/A 12/31/2013   Procedure: ESOPHAGOGASTRODUODENOSCOPY (EGD);  Surgeon: [name], MD;  [facility];   Service: Endoscopy;  Laterality: N/A;  1200   ESOPHAGOGASTRODUODENOSCOPY N/A 06/19/2014   Procedure: ESOPHAGOGASTRODUODENOSCOPY (EGD);  Surgeon: [name], MD;  [facility];  Service: Endoscopy;  Laterality: N/A;  1055   ESOPHAGOGASTRODUODENOSCOPY N/A 01/15/2015   Procedure: ESOPHAGOGASTRODUODENOSCOPY (EGD);  Surgeon: [name], MD;  [facility];  Service: Endoscopy;  Laterality: N/A;  730 - moved to 9:45 - moved to 1250-Ann notified pt   ESOPHAGOGASTRODUODENOSCOPY N/A 06/30/2015   Procedure: ESOPHAGOGASTRODUODENOSCOPY (EGD);  Surgeon: [name], MD;  [facility];  Service: Endoscopy;  Laterality: N/A;  1200   ESOPHAGOGASTRODUODENOSCOPY N/A 01/05/2016   Procedure: ESOPHAGOGASTRODUODENOSCOPY (EGD);  Surgeon: [name], MD;  [facility];  Service: Endoscopy;  Laterality: N/A;  830   ESOPHAGOGASTRODUODENOSCOPY N/A 08/03/2016   Procedure: ESOPHAGOGASTRODUODENOSCOPY (EGD);  Surgeon: [name], MD;  [facility];  Service: Endoscopy;  Laterality: N/A;  930   ESOPHAGOGASTRODUODENOSCOPY N/A 05/02/2017   Procedure: ESOPHAGOGASTRODUODENOSCOPY (EGD);  Surgeon: [name], MD;  [facility];  Service: Endoscopy;  Laterality: N/A;  12:00   ESOPHAGOGASTRODUODENOSCOPY N/A 08/17/2017   Procedure: ESOPHAGOGASTRODUODENOSCOPY (EGD);  Surgeon: [name], MD;  [facility];  Service: Endoscopy;  Laterality: N/A;  8:15   ESOPHAGOGASTRODUODENOSCOPY N/A 09/12/2017   Procedure: ESOPHAGOGASTRODUODENOSCOPY (EGD);  Surgeon: Laural Golden,  Mechele Dawley, MD;  [facility];  Service: Endoscopy;  Laterality: N/A;  1040   ESOPHAGOGASTRODUODENOSCOPY N/A 10/10/2017   Procedure: ESOPHAGOGASTRODUODENOSCOPY (EGD);  Surgeon: [name], MD;  [facility];  Service: Endoscopy;  Laterality: N/A;  225   ESOPHAGOGASTRODUODENOSCOPY N/A 11/09/2017   Procedure: ESOPHAGOGASTRODUODENOSCOPY (EGD);   Surgeon: [name], MD;  [facility];  Service: Endoscopy;  Laterality: N/A;   ESOPHAGOGASTRODUODENOSCOPY (EGD) WITH PROPOFOL  09/24/2012   Procedure: ESOPHAGOGASTRODUODENOSCOPY (EGD) WITH PROPOFOL;  Surgeon: [name], MD;  [facility]: [facility];  Service: Endoscopy;  Laterality: N/A;  GE junction at 36   ESOPHAGOGASTRODUODENOSCOPY (EGD) WITH PROPOFOL N/A 07/06/2017   Procedure: ESOPHAGOGASTRODUODENOSCOPY (EGD) WITH PROPOFOL;  Surgeon: [name], MD;  [facility];  Service: Endoscopy;  Laterality: N/A;   ESOPHAGOGASTRODUODENOSCOPY W/ BANDING  08/2010   GIVENS CAPSULE STUDY N/A 07/05/2017   Procedure: GIVENS CAPSULE STUDY;  Surgeon: [name], MD;  [facility];  Service: Endoscopy;  Laterality: N/A;   HOT HEMOSTASIS N/A 08/17/2017   Procedure: HOT HEMOSTASIS (ARGON PLASMA COAGULATION/BICAP);  Surgeon: [name], MD;  [facility];  Service: Endoscopy;  Laterality: N/A;   HOT HEMOSTASIS  09/12/2017   Procedure: HOT HEMOSTASIS (ARGON PLASMA COAGULATION/BICAP);  Surgeon: [name], MD;  [facility];  Service: Endoscopy;;  gastric   HOT HEMOSTASIS  10/10/2017   Procedure: HOT HEMOSTASIS (ARGON PLASMA COAGULATION/BICAP);  Surgeon: [name], MD;  [facility];  Service: Endoscopy;;   POLYPECTOMY  03/24/2016   Procedure: POLYPECTOMY;  Surgeon: [name], MD;  [facility];  Service: Endoscopy;;  sigmoid polyp   PORTACATH PLACEMENT Right 06/02/2019   Procedure: INSERTION PORT-A-CATH;  Surgeon: Aviva Signs, MD;  Location: [facility];  Service: General;  Laterality: Right;   RIGHT HEART CATH N/A 04/16/2017   Procedure: Right Heart Cath;  Surgeon: [name], MD;  Location: [place] [facility];  Service: Cardiovascular;  Laterality: N/A;   RIGHT HEART CATH N/A 07/12/2017   Procedure: RIGHT HEART CATH;  Surgeon: [name], MD;  Location: [place] [facility];   Service: Cardiovascular;  Laterality: N/A;   TONSILLECTOMY     UPPER GASTROINTESTINAL ENDOSCOPY  03/15/2011   EGD ED BANDING/TCS   UPPER GASTROINTESTINAL ENDOSCOPY  09/05/2010   UPPER GASTROINTESTINAL ENDOSCOPY  08/11/2010   VAGINAL HYSTERECTOMY      Family History: Family History  Problem Relation Age of Onset   Lung cancer Mother    Diabetes Father    Diabetes Sister    Hypertension Sister    Hypothyroidism Sister    Colon cancer Brother    Diabetes Sister    Hypothyroidism Brother    Healthy Daughter    Obesity Daughter    Hypertension Daughter     Social History: Social History   Tobacco Use  Smoking Status Never Smoker  Smokeless Tobacco Never Used   Social History   Substance and Sexual Activity  Alcohol Use No   Alcohol/week: 0.0 standard drinks   Social History   Substance and Sexual Activity  Drug Use No    Allergies: Allergies  Allergen Reactions   Ferumoxytol Other (See Comments)    Patient has severe back and chest pain when receiving FERAHEME infusions.    Tramadol Hcl Other (See Comments)    WEAKNESS     Medications: Current Outpatient Medications  Medication Sig Dispense Refill   bumetanide (  BUMEX) 2 MG tablet Take 2 mg by mouth 2 (two) times daily.      busPIRone (BUSPAR) 10 MG tablet Take 10 mg by mouth 2 (two) times daily.     Cholecalciferol 25 MCG (1000 UT) tablet Take 1,000 Units by mouth every other day.      ferrous sulfate 324 MG TBEC Take 324 mg by mouth at bedtime.     HUMALOG 100 UNIT/ML injection Inject 15-35 Units into the skin 3 (three) times daily with meals. Sliding scale insulin 100-150= 15 units 150-200= 20 units 250-300= 30 units 300-350= 35 units  6   Insulin Glargine (LANTUS SOLOSTAR) 100 UNIT/ML Solostar Pen Inject 10-60 Units into the skin See admin instructions. Inject 55 units subcutaneously in the morning & 10 units subcutaneously  if needed, in addition     lactulose (CHRONULAC) 10  GM/15ML solution Take 60 mLs by mouth in the morning, at noon, in the evening, and at bedtime. *If having watery stools more than 3 times daily, reduce to taking 3 times daily     levothyroxine (SYNTHROID, LEVOTHROID) 25 MCG tablet Take 25 mcg by mouth daily before breakfast.      lidocaine-prilocaine (EMLA) cream Apply 1 application topically daily as needed (applied to port). 30 g 0   magnesium oxide (MAG-OX) 400 MG tablet Take 400 mg by mouth at bedtime.      metolazone (ZAROXOLYN) 2.5 MG tablet Take 1 tablet (2.5 mg total) by mouth every other day as needed. 10 tablet 0   Pediatric Multivitamins-Iron (FLINTSTONES PLUS IRON PO) Take 2 tablets by mouth daily.     potassium chloride SA (KLOR-CON) 20 MEQ tablet Take 2 tablets (40 mEq total) by mouth daily. (Patient taking differently: Take 20 mEq by mouth 2 (two) times daily. ) 60 tablet 2   rifaximin (XIFAXAN) 550 MG TABS tablet Take 1 tablet (550 mg total) by mouth 2 (two) times daily.     rosuvastatin (CRESTOR) 10 MG tablet Take 10 mg by mouth daily.     sertraline (ZOLOFT) 50 MG tablet Take 50 mg by mouth daily.      spironolactone (ALDACTONE) 100 MG tablet Take 100 mg by mouth daily.     trimethoprim-polymyxin b (POLYTRIM) ophthalmic solution Place 1 drop into the left eye See admin instructions. Use 3 to 4 times daily for 2 days following monthly eye injection  12   zinc sulfate 220 (50 Zn) MG capsule Take 1 capsule (220 mg total) by mouth daily. 30 capsule 1   spironolactone (ALDACTONE) 100 MG tablet Take 1 tablet (100 mg total) by mouth daily. (Patient taking differently: Take 100 mg by mouth. ) 30 tablet 3   No current facility-administered medications for this visit.    Review of Systems: GENERAL: negative for malaise, night sweats HEENT: No changes in hearing or vision, no nose bleeds or other nasal problems. NECK: Negative for lumps, goiter, pain and significant neck swelling RESPIRATORY: Negative for cough,  wheezing CARDIOVASCULAR: Negative for chest pain, leg swelling, palpitations, orthopnea GI: SEE HPI MUSCULOSKELETAL: Negative for joint pain or swelling, back pain, and muscle pain. SKIN: Negative for lesions, rash PSYCH: Negative for sleep disturbance, mood disorder and recent psychosocial stressors. HEMATOLOGY Negative for prolonged bleeding, bruising easily, and swollen nodes. ENDOCRINE: Negative for cold or heat intolerance, polyuria, polydipsia and goiter. NEURO: negative for tremor, gait imbalance, syncope and seizures. The remainder of the review of systems is noncontributory.   Physical Exam: BP 130/68 (BP Location: Right Arm, Patient  Position: Sitting, Cuff Size: Large)    Pulse 75    Temp 98 F (36.7 C) (Oral)    Ht 5' 3"  (1.6 m)    Wt 198 lb 8 oz (90 kg)    BMI 35.16 kg/m  GENERAL: The patient is AO x3 but does not know the exact day of the month, in no acute distress. HEENT: Head is normocephalic and atraumatic. EOMI are intact. Mouth is well hydrated and without lesions. NECK: Supple. No masses LUNGS: Clear to auscultation. No presence of rhonchi/wheezing/rales. Adequate chest expansion HEART: RRR, normal s1 and s2. ABDOMEN: Soft, nontender, no guarding, no peritoneal signs, and nondistended. BS +. No masses. EXTREMITIES: Without any cyanosis, clubbing, rash, lesions. Has 1 pitting edema  Up to the mid thighs bilaterally. NEUROLOGIC: AOx3, no focal motor deficit. No asterixis. SKIN: Has a slightly jaundiced sclera but no generalized jaundice, no rashes  Imaging/Labs: as above  I personally reviewed and interpreted the available labs, imaging and endoscopic files.  Impression and Plan: Maria [name] is a 64 y.o. female with past medical history of NASH cirrhosis complicated by recurrent hepatic encephalopathy, ascites status post TIPS placement, history of variceal bleeding requiring banding, portal hypertensive gastropathy and GAVE, depression, diabetes,  hyperlipidemia, hypertension, hypothyroidism, osteoporosis, who presents for follow up after recent hospitalization for hepatic encephalopathy.  Patient has had complicated medical course throughout the years due to multiple complications secondary to liver cirrhosis.  She has been evaluated by [facility] for liver transplant and is currently listed for this but her meld score is not significantly high.  At this point, the patient has presented improvement in her symptoms of encephalopathy with the current regimen of lactulose 40 g 3 times daily which she should continue for titration of 3-4 bowel movements per day, along with continuing the rifaximin and zinc.  It is likely that the symptoms have become harder to treat after the TIPS placement.  Next step in management if the symptoms recur could be neomycin but would like to avoid this in the setting of recent elevated renal function.  We will need to obtain repeat MELD labs in 1 week.  She will need to continue her diuretics at the current dose and would not like to increase the dosage even though she is having lower extremity edema as this may worsen her kidney function more.  Overall, her condition is poor but clinically she seems to be doing better than before being admitted to [facility].  - Continue Xifaxan 550 mg BID - Continue Lactulose, titrate to have 3-4 BM per day - Continue Zinc 220 mg qday - Continue torsemide 2.5 mg every other day and aldactone 100 mg qday - Perform MELD labs in a week - Reduce salt intake to <2 g per day - Can take Tylenol max of 2 g per day (650 mg q8h) for pain - Avoid NSAIDs for pain - Avoid eating raw oysters or shellfish - Follow up in 4 weeks  All questions were answered.      Harvel Quale, MD Gastroenterology and Hepatology [facility]

## 2020-07-19 NOTE — Telephone Encounter (Signed)
Holley Dexter, RN  Grayland Ormond, LPN Maria [name] is 8.8 today. Its not in the computer yet bc they are having computer trouble. I tried to call [facility] and they are over there at an appointment with Dr. Loletha Grayer. Would you mind letting them know. And Ill see them next week. Thanks

## 2020-07-19 NOTE — Telephone Encounter (Signed)
Hemoglobin notification  thanks

## 2020-07-19 NOTE — Telephone Encounter (Signed)
Hi Crystal, Please let the aptient know about this result  Thanks  [name], MD Gastroenterology and Hepatology [facility]

## 2020-07-19 NOTE — Telephone Encounter (Signed)
Patient is aware of all. CLS 07/19/2020

## 2020-07-19 NOTE — Patient Instructions (Addendum)
Continue Xifaxan 550 mg BID Continue Lactulose, titrate to have 3-4 BM per day Continue Zinc 220 mg qday Perform blood workup in a week Reduce salt intake to <2 g per day Can take Tylenol max of 2 g per day (650 mg q8h) for pain Avoid NSAIDs for pain Avoid eating raw oysters or shellfish Follow up in 4 weeks

## 2020-07-20 DIAGNOSIS — E7220 Disorder of urea cycle metabolism, unspecified: Secondary | ICD-10-CM | POA: Diagnosis not present

## 2020-07-20 DIAGNOSIS — K7290 Hepatic failure, unspecified without coma: Secondary | ICD-10-CM | POA: Diagnosis not present

## 2020-07-20 DIAGNOSIS — Z299 Encounter for prophylactic measures, unspecified: Secondary | ICD-10-CM | POA: Diagnosis not present

## 2020-07-20 DIAGNOSIS — E1165 Type 2 diabetes mellitus with hyperglycemia: Secondary | ICD-10-CM | POA: Diagnosis not present

## 2020-07-20 DIAGNOSIS — K7460 Unspecified cirrhosis of liver: Secondary | ICD-10-CM | POA: Diagnosis not present

## 2020-07-20 NOTE — Telephone Encounter (Signed)
Noted  

## 2020-07-22 DIAGNOSIS — M25552 Pain in left hip: Principal | ICD-10-CM

## 2020-07-22 DIAGNOSIS — S72002A Fracture of unspecified part of neck of left femur, initial encounter for closed fracture: Principal | ICD-10-CM

## 2020-07-22 DIAGNOSIS — E119 Type 2 diabetes mellitus without complications: Principal | ICD-10-CM

## 2020-07-22 DIAGNOSIS — Z79899 Other long term (current) drug therapy: Principal | ICD-10-CM

## 2020-07-22 DIAGNOSIS — Z794 Long term (current) use of insulin: Principal | ICD-10-CM

## 2020-07-22 DIAGNOSIS — Z20822 Contact with and (suspected) exposure to covid-19: Principal | ICD-10-CM

## 2020-07-22 DIAGNOSIS — F329 Major depressive disorder, single episode, unspecified: Principal | ICD-10-CM

## 2020-07-22 DIAGNOSIS — I119 Hypertensive heart disease without heart failure: Principal | ICD-10-CM

## 2020-07-22 DIAGNOSIS — R0902 Hypoxemia: Secondary | ICD-10-CM | POA: Diagnosis not present

## 2020-07-22 DIAGNOSIS — Y998 Other external cause status: Secondary | ICD-10-CM | POA: Diagnosis not present

## 2020-07-22 DIAGNOSIS — I517 Cardiomegaly: Secondary | ICD-10-CM | POA: Diagnosis not present

## 2020-07-22 DIAGNOSIS — S72042A Displaced fracture of base of neck of left femur, initial encounter for closed fracture: Secondary | ICD-10-CM | POA: Diagnosis not present

## 2020-07-22 DIAGNOSIS — R52 Pain, unspecified: Secondary | ICD-10-CM | POA: Diagnosis not present

## 2020-07-22 DIAGNOSIS — Y9301 Activity, walking, marching and hiking: Secondary | ICD-10-CM | POA: Diagnosis not present

## 2020-07-22 DIAGNOSIS — R410 Disorientation, unspecified: Secondary | ICD-10-CM | POA: Diagnosis not present

## 2020-07-22 DIAGNOSIS — Z90710 Acquired absence of both cervix and uterus: Secondary | ICD-10-CM | POA: Diagnosis not present

## 2020-07-22 DIAGNOSIS — W19XXXA Unspecified fall, initial encounter: Secondary | ICD-10-CM | POA: Diagnosis not present

## 2020-07-22 DIAGNOSIS — R0602 Shortness of breath: Secondary | ICD-10-CM | POA: Diagnosis not present

## 2020-07-22 DIAGNOSIS — R404 Transient alteration of awareness: Secondary | ICD-10-CM | POA: Diagnosis not present

## 2020-07-22 DIAGNOSIS — K429 Umbilical hernia without obstruction or gangrene: Secondary | ICD-10-CM | POA: Diagnosis not present

## 2020-07-23 ENCOUNTER — Telehealth (INDEPENDENT_AMBULATORY_CARE_PROVIDER_SITE_OTHER): Payer: Self-pay

## 2020-07-23 ENCOUNTER — Encounter (INDEPENDENT_AMBULATORY_CARE_PROVIDER_SITE_OTHER): Payer: PPO | Admitting: Ophthalmology

## 2020-07-23 ENCOUNTER — Emergency Department
Admit: 2020-07-23 | Discharge: 2020-07-23 | Disposition: A | Discharge status: Other Acute Inpatient Hospital | Payer: PRIVATE HEALTH INSURANCE

## 2020-07-23 ENCOUNTER — Ambulatory Visit
Admit: 2020-07-23 | Discharge: 2020-07-23 | Disposition: A | Discharge status: Other Acute Inpatient Hospital | Payer: PRIVATE HEALTH INSURANCE

## 2020-07-23 DIAGNOSIS — E119 Type 2 diabetes mellitus without complications: Principal | ICD-10-CM

## 2020-07-23 DIAGNOSIS — Z79899 Other long term (current) drug therapy: Principal | ICD-10-CM

## 2020-07-23 DIAGNOSIS — M25552 Pain in left hip: Principal | ICD-10-CM

## 2020-07-23 DIAGNOSIS — Z20822 Contact with and (suspected) exposure to covid-19: Principal | ICD-10-CM

## 2020-07-23 DIAGNOSIS — I119 Hypertensive heart disease without heart failure: Principal | ICD-10-CM

## 2020-07-23 DIAGNOSIS — Z794 Long term (current) use of insulin: Principal | ICD-10-CM

## 2020-07-23 DIAGNOSIS — S72002A Fracture of unspecified part of neck of left femur, initial encounter for closed fracture: Principal | ICD-10-CM

## 2020-07-23 DIAGNOSIS — F329 Major depressive disorder, single episode, unspecified: Principal | ICD-10-CM

## 2020-07-23 DIAGNOSIS — S7292XD Unspecified fracture of left femur, subsequent encounter for closed fracture with routine healing: Secondary | ICD-10-CM | POA: Diagnosis not present

## 2020-07-23 DIAGNOSIS — Z9181 History of falling: Secondary | ICD-10-CM | POA: Diagnosis not present

## 2020-07-23 DIAGNOSIS — E785 Hyperlipidemia, unspecified: Secondary | ICD-10-CM | POA: Diagnosis not present

## 2020-07-23 DIAGNOSIS — E7800 Pure hypercholesterolemia, unspecified: Secondary | ICD-10-CM | POA: Diagnosis not present

## 2020-07-23 DIAGNOSIS — I509 Heart failure, unspecified: Secondary | ICD-10-CM | POA: Diagnosis not present

## 2020-07-23 DIAGNOSIS — K760 Fatty (change of) liver, not elsewhere classified: Secondary | ICD-10-CM | POA: Diagnosis not present

## 2020-07-23 DIAGNOSIS — J9601 Acute respiratory failure with hypoxia: Secondary | ICD-10-CM | POA: Diagnosis not present

## 2020-07-23 DIAGNOSIS — D696 Thrombocytopenia, unspecified: Secondary | ICD-10-CM | POA: Diagnosis not present

## 2020-07-23 DIAGNOSIS — M80052A Age-related osteoporosis with current pathological fracture, left femur, initial encounter for fracture: Secondary | ICD-10-CM | POA: Diagnosis not present

## 2020-07-23 DIAGNOSIS — I517 Cardiomegaly: Secondary | ICD-10-CM | POA: Diagnosis not present

## 2020-07-23 DIAGNOSIS — I8500 Esophageal varices without bleeding: Secondary | ICD-10-CM | POA: Diagnosis not present

## 2020-07-23 DIAGNOSIS — K921 Melena: Secondary | ICD-10-CM | POA: Diagnosis not present

## 2020-07-23 DIAGNOSIS — Z6836 Body mass index (BMI) 36.0-36.9, adult: Secondary | ICD-10-CM | POA: Diagnosis not present

## 2020-07-23 DIAGNOSIS — N189 Chronic kidney disease, unspecified: Secondary | ICD-10-CM | POA: Diagnosis not present

## 2020-07-23 DIAGNOSIS — E872 Acidosis: Secondary | ICD-10-CM | POA: Diagnosis not present

## 2020-07-23 DIAGNOSIS — I130 Hypertensive heart and chronic kidney disease with heart failure and stage 1 through stage 4 chronic kidney disease, or unspecified chronic kidney disease: Secondary | ICD-10-CM | POA: Diagnosis not present

## 2020-07-23 DIAGNOSIS — K3189 Other diseases of stomach and duodenum: Secondary | ICD-10-CM | POA: Diagnosis not present

## 2020-07-23 DIAGNOSIS — N183 Chronic kidney disease, stage 3 unspecified: Secondary | ICD-10-CM | POA: Diagnosis not present

## 2020-07-23 DIAGNOSIS — R58 Hemorrhage, not elsewhere classified: Secondary | ICD-10-CM | POA: Diagnosis not present

## 2020-07-23 DIAGNOSIS — E1122 Type 2 diabetes mellitus with diabetic chronic kidney disease: Secondary | ICD-10-CM | POA: Diagnosis not present

## 2020-07-23 DIAGNOSIS — D62 Acute posthemorrhagic anemia: Secondary | ICD-10-CM | POA: Diagnosis not present

## 2020-07-23 DIAGNOSIS — K31819 Angiodysplasia of stomach and duodenum without bleeding: Secondary | ICD-10-CM | POA: Diagnosis not present

## 2020-07-23 DIAGNOSIS — F32A Depression, unspecified: Secondary | ICD-10-CM | POA: Diagnosis not present

## 2020-07-23 DIAGNOSIS — M797 Fibromyalgia: Secondary | ICD-10-CM | POA: Diagnosis not present

## 2020-07-23 DIAGNOSIS — R0902 Hypoxemia: Secondary | ICD-10-CM | POA: Diagnosis not present

## 2020-07-23 DIAGNOSIS — W19XXXA Unspecified fall, initial encounter: Secondary | ICD-10-CM | POA: Diagnosis not present

## 2020-07-23 DIAGNOSIS — I272 Pulmonary hypertension, unspecified: Secondary | ICD-10-CM | POA: Diagnosis not present

## 2020-07-23 DIAGNOSIS — E1159 Type 2 diabetes mellitus with other circulatory complications: Secondary | ICD-10-CM | POA: Diagnosis not present

## 2020-07-23 DIAGNOSIS — I440 Atrioventricular block, first degree: Secondary | ICD-10-CM | POA: Diagnosis not present

## 2020-07-23 DIAGNOSIS — I5030 Unspecified diastolic (congestive) heart failure: Secondary | ICD-10-CM | POA: Diagnosis not present

## 2020-07-23 DIAGNOSIS — K429 Umbilical hernia without obstruction or gangrene: Secondary | ICD-10-CM | POA: Diagnosis not present

## 2020-07-23 DIAGNOSIS — Z95828 Presence of other vascular implants and grafts: Secondary | ICD-10-CM | POA: Diagnosis not present

## 2020-07-23 DIAGNOSIS — K2289 Other specified disease of esophagus: Secondary | ICD-10-CM | POA: Diagnosis not present

## 2020-07-23 DIAGNOSIS — Z743 Need for continuous supervision: Secondary | ICD-10-CM | POA: Diagnosis not present

## 2020-07-23 DIAGNOSIS — E60 Dietary zinc deficiency: Secondary | ICD-10-CM | POA: Diagnosis not present

## 2020-07-23 DIAGNOSIS — K7290 Hepatic failure, unspecified without coma: Secondary | ICD-10-CM | POA: Diagnosis not present

## 2020-07-23 DIAGNOSIS — R4182 Altered mental status, unspecified: Secondary | ICD-10-CM | POA: Diagnosis not present

## 2020-07-23 DIAGNOSIS — E1169 Type 2 diabetes mellitus with other specified complication: Secondary | ICD-10-CM | POA: Diagnosis not present

## 2020-07-23 DIAGNOSIS — Y9301 Activity, walking, marching and hiking: Secondary | ICD-10-CM | POA: Diagnosis not present

## 2020-07-23 DIAGNOSIS — I959 Hypotension, unspecified: Secondary | ICD-10-CM | POA: Diagnosis not present

## 2020-07-23 DIAGNOSIS — K317 Polyp of stomach and duodenum: Secondary | ICD-10-CM | POA: Diagnosis not present

## 2020-07-23 DIAGNOSIS — I5032 Chronic diastolic (congestive) heart failure: Secondary | ICD-10-CM | POA: Diagnosis not present

## 2020-07-23 DIAGNOSIS — S72042A Displaced fracture of base of neck of left femur, initial encounter for closed fracture: Secondary | ICD-10-CM | POA: Diagnosis not present

## 2020-07-23 DIAGNOSIS — K7469 Other cirrhosis of liver: Secondary | ICD-10-CM | POA: Diagnosis not present

## 2020-07-23 DIAGNOSIS — E610 Copper deficiency: Secondary | ICD-10-CM | POA: Diagnosis not present

## 2020-07-23 DIAGNOSIS — R41841 Cognitive communication deficit: Secondary | ICD-10-CM | POA: Diagnosis not present

## 2020-07-23 DIAGNOSIS — K922 Gastrointestinal hemorrhage, unspecified: Secondary | ICD-10-CM | POA: Diagnosis not present

## 2020-07-23 DIAGNOSIS — Z7401 Bed confinement status: Secondary | ICD-10-CM | POA: Diagnosis not present

## 2020-07-23 DIAGNOSIS — E039 Hypothyroidism, unspecified: Secondary | ICD-10-CM | POA: Diagnosis not present

## 2020-07-23 DIAGNOSIS — K31811 Angiodysplasia of stomach and duodenum with bleeding: Secondary | ICD-10-CM | POA: Diagnosis not present

## 2020-07-23 DIAGNOSIS — S7292XA Unspecified fracture of left femur, initial encounter for closed fracture: Secondary | ICD-10-CM | POA: Diagnosis not present

## 2020-07-23 DIAGNOSIS — R161 Splenomegaly, not elsewhere classified: Secondary | ICD-10-CM | POA: Diagnosis not present

## 2020-07-23 DIAGNOSIS — R1312 Dysphagia, oropharyngeal phase: Secondary | ICD-10-CM | POA: Diagnosis not present

## 2020-07-23 DIAGNOSIS — N179 Acute kidney failure, unspecified: Secondary | ICD-10-CM | POA: Diagnosis not present

## 2020-07-23 DIAGNOSIS — D509 Iron deficiency anemia, unspecified: Secondary | ICD-10-CM | POA: Diagnosis not present

## 2020-07-23 DIAGNOSIS — Z6838 Body mass index (BMI) 38.0-38.9, adult: Secondary | ICD-10-CM | POA: Diagnosis not present

## 2020-07-23 DIAGNOSIS — F322 Major depressive disorder, single episode, severe without psychotic features: Secondary | ICD-10-CM | POA: Diagnosis not present

## 2020-07-23 DIAGNOSIS — S72002D Fracture of unspecified part of neck of left femur, subsequent encounter for closed fracture with routine healing: Secondary | ICD-10-CM | POA: Diagnosis not present

## 2020-07-23 DIAGNOSIS — R0602 Shortness of breath: Secondary | ICD-10-CM | POA: Diagnosis not present

## 2020-07-23 DIAGNOSIS — F419 Anxiety disorder, unspecified: Secondary | ICD-10-CM | POA: Diagnosis not present

## 2020-07-23 DIAGNOSIS — I444 Left anterior fascicular block: Secondary | ICD-10-CM | POA: Diagnosis not present

## 2020-07-23 DIAGNOSIS — R2689 Other abnormalities of gait and mobility: Secondary | ICD-10-CM | POA: Diagnosis not present

## 2020-07-23 DIAGNOSIS — Y998 Other external cause status: Secondary | ICD-10-CM | POA: Diagnosis not present

## 2020-07-23 DIAGNOSIS — M6281 Muscle weakness (generalized): Secondary | ICD-10-CM | POA: Diagnosis not present

## 2020-07-23 DIAGNOSIS — E1129 Type 2 diabetes mellitus with other diabetic kidney complication: Secondary | ICD-10-CM | POA: Diagnosis not present

## 2020-07-23 DIAGNOSIS — M2550 Pain in unspecified joint: Secondary | ICD-10-CM | POA: Diagnosis not present

## 2020-07-23 DIAGNOSIS — E8770 Fluid overload, unspecified: Secondary | ICD-10-CM | POA: Diagnosis not present

## 2020-07-23 DIAGNOSIS — D61818 Other pancytopenia: Secondary | ICD-10-CM | POA: Diagnosis not present

## 2020-07-23 DIAGNOSIS — E669 Obesity, unspecified: Secondary | ICD-10-CM | POA: Diagnosis not present

## 2020-07-23 DIAGNOSIS — J810 Acute pulmonary edema: Secondary | ICD-10-CM | POA: Diagnosis not present

## 2020-07-23 DIAGNOSIS — K766 Portal hypertension: Secondary | ICD-10-CM | POA: Diagnosis not present

## 2020-07-23 DIAGNOSIS — K7460 Unspecified cirrhosis of liver: Secondary | ICD-10-CM | POA: Diagnosis not present

## 2020-07-23 DIAGNOSIS — E118 Type 2 diabetes mellitus with unspecified complications: Secondary | ICD-10-CM | POA: Diagnosis not present

## 2020-07-23 DIAGNOSIS — I8510 Secondary esophageal varices without bleeding: Secondary | ICD-10-CM | POA: Diagnosis not present

## 2020-07-23 DIAGNOSIS — Z90710 Acquired absence of both cervix and uterus: Secondary | ICD-10-CM | POA: Diagnosis not present

## 2020-07-23 DIAGNOSIS — R2243 Localized swelling, mass and lump, lower limb, bilateral: Secondary | ICD-10-CM | POA: Diagnosis not present

## 2020-07-23 DIAGNOSIS — E1165 Type 2 diabetes mellitus with hyperglycemia: Secondary | ICD-10-CM | POA: Diagnosis not present

## 2020-07-23 DIAGNOSIS — S72002S Fracture of unspecified part of neck of left femur, sequela: Secondary | ICD-10-CM | POA: Diagnosis not present

## 2020-07-23 DIAGNOSIS — S72052A Unspecified fracture of head of left femur, initial encounter for closed fracture: Secondary | ICD-10-CM | POA: Diagnosis not present

## 2020-07-23 DIAGNOSIS — Z96642 Presence of left artificial hip joint: Secondary | ICD-10-CM | POA: Diagnosis not present

## 2020-07-23 DIAGNOSIS — R188 Other ascites: Secondary | ICD-10-CM | POA: Diagnosis not present

## 2020-07-23 DIAGNOSIS — I10 Essential (primary) hypertension: Secondary | ICD-10-CM | POA: Diagnosis not present

## 2020-07-23 DIAGNOSIS — K7581 Nonalcoholic steatohepatitis (NASH): Secondary | ICD-10-CM | POA: Diagnosis not present

## 2020-07-23 LAB — PROTIME-INR: PROTIME: 14.3 s — ABNORMAL HIGH (ref 9.1–11.0)

## 2020-07-23 LAB — CBC W/ AUTO DIFF
BASOPHILS ABSOLUTE COUNT: 0 10*9/L (ref 0.0–0.2)
BASOPHILS RELATIVE PERCENT: 0.3 %
EOSINOPHILS ABSOLUTE COUNT: 0 10*9/L (ref 0.0–0.4)
EOSINOPHILS RELATIVE PERCENT: 0.3 %
HEMATOCRIT: 33.2 % — ABNORMAL LOW (ref 34.0–44.0)
HEMOGLOBIN: 10.3 g/dL — ABNORMAL LOW (ref 11.5–15.0)
LYMPHOCYTES ABSOLUTE COUNT: 0.6 10*9/L — ABNORMAL LOW (ref 0.7–4.5)
LYMPHOCYTES RELATIVE PERCENT: 7.2 %
MEAN CORPUSCULAR HEMOGLOBIN CONC: 31 g/dL — ABNORMAL LOW (ref 32.0–36.0)
MEAN CORPUSCULAR HEMOGLOBIN: 27 pg (ref 27.0–34.0)
MEAN CORPUSCULAR VOLUME: 87.1 fL (ref 80.0–98.0)
MONOCYTES ABSOLUTE COUNT: 0.5 10*9/L (ref 0.1–1.0)
NEUTROPHILS ABSOLUTE COUNT: 7.6 10*9/L (ref 1.8–7.8)
NEUTROPHILS RELATIVE PERCENT: 86.3 %
NUCLEATED RED BLOOD CELLS: 0 /100{WBCs} (ref <=1)
PLATELET COUNT: 21 10*9/L — CL (ref 140–415)
RED BLOOD CELL COUNT: 3.81 10*12/L (ref 3.80–5.10)
RED CELL DISTRIBUTION WIDTH: 19.1 % — ABNORMAL HIGH (ref 11.5–14.5)

## 2020-07-23 LAB — COMPREHENSIVE METABOLIC PANEL
ALBUMIN: 2.7 g/dL — ABNORMAL LOW (ref 3.5–5.0)
ALKALINE PHOSPHATASE: 126 U/L — ABNORMAL HIGH (ref 46–116)
ALT (SGPT): 28 U/L (ref 12–78)
ANION GAP: 11 mmol/L (ref 3–11)
AST (SGOT): 56 U/L — ABNORMAL HIGH (ref 15–40)
BILIRUBIN TOTAL: 2.7 mg/dL — ABNORMAL HIGH (ref 0.3–1.2)
BLOOD UREA NITROGEN: 28 mg/dL — ABNORMAL HIGH (ref 8–20)
BUN / CREAT RATIO: 15
CALCIUM: 9.2 mg/dL (ref 8.5–10.1)
CHLORIDE: 100 mmol/L (ref 98–107)
CO2: 25.2 mmol/L (ref 21.0–32.0)
EGFR CKD-EPI AA FEMALE: 31 mL/min/{1.73_m2}
GLUCOSE RANDOM: 165 mg/dL (ref 70–179)
POTASSIUM: 4 mmol/L (ref 3.5–5.0)
PROTEIN TOTAL: 6 g/dL (ref 6.0–8.0)
SODIUM: 136 mmol/L (ref 135–145)

## 2020-07-23 LAB — LYMPHOCYTES ABSOLUTE COUNT: Lymphocytes:NCnc:Pt:Bld:Qn:Automated count: 0.6 — ABNORMAL LOW

## 2020-07-23 LAB — PROTIME: Coagulation tissue factor induced:Time:Pt:PPP:Qn:Coag: 14.3 — ABNORMAL HIGH

## 2020-07-23 LAB — PROTEIN TOTAL: Protein:MCnc:Pt:Ser/Plas:Qn:: 6

## 2020-07-23 LAB — SMEAR REVIEW: Lab: 0

## 2020-07-23 MED ADMIN — ondansetron (ZOFRAN) injection 4 mg: 4 mg | INTRAVENOUS | @ 06:00:00 | Stop: 2020-07-23

## 2020-07-23 MED ADMIN — MORPhine injection 4 mg: 4 mg | INTRAVENOUS | @ 06:00:00 | Stop: 2020-07-23

## 2020-07-23 MED ADMIN — ketorolac (TORADOL) injection 15 mg: 15 mg | INTRAVENOUS | @ 06:00:00 | Stop: 2020-07-23

## 2020-07-23 NOTE — Telephone Encounter (Signed)
Thanks for notifying us Maria [name]. Unfortunately she is a poor surgical candidate given her cirrhosis, which increases her perioperative risk significantly.  [name], MD Gastroenterology and Hepatology [facility]

## 2020-07-23 NOTE — Telephone Encounter (Signed)
Dr. Laural Golden and Renda Rolls RN have been made aware.

## 2020-07-23 NOTE — Telephone Encounter (Signed)
[name] is calling informing [name] and Office that Maria [name] fell last night and is in [facility] with a fractured femur and not sure exactly right now what will be done from here

## 2020-07-23 NOTE — Unmapped (Signed)
Received on call page from patient's husband who reports patient had a fall overnight - he reports she has a fractured femur and is currently @ [facility] with plans to transfer to [facility] - confirmed will update team of this hospital admission.  Provided [place] Consult # to put local providers in touch with hepatology team as needed.

## 2020-07-23 NOTE — Unmapped (Signed)
Patient reports left hip pain following fall at home. Patient reports no loss of consciousness or head injury.

## 2020-07-23 NOTE — Unmapped (Signed)
Emergency Department Provider Note        ED Clinical Impression     Final diagnoses:   Closed fracture of neck of left femur, initial encounter (CMS-HCC) (Primary)       ED Assessment/Plan       Condition: Stable  Disposition: Discharge    This chart has been completed using Dragon Medical Dictation software, and while attempts have been made to ensure accuracy, certain words and phrases may not be transcribed as intended.     History     Chief Complaint   Patient presents with   ??? Fall     left hip pain     HPI    Marilyn French is a 64 y.o. female  who presents today to the  emergency department complaining of left hip pain after fall in her bedroom.   No head injury. No syncope.         Allergies: is allergic to ferumoxytol, definity [perflutren lipid microspheres], and tramadol hcl.  Medications: has a current medication list which includes the following long-term medication(s): bumetanide, lantus solostar u-100 insulin, levothyroxine, rosuvastatin, sertraline, and spironolactone.  PMHx:  has a past medical history of Cirrhosis (CMS-HCC), Depression, Diabetes mellitus (CMS-HCC), Fibromyalgia, GAVE (gastric antral vascular ectasia), History of transfusion (04/2019), HTN (hypertension), Hypercholesterolemia, Hypothyroid, NAFLD (nonalcoholic fatty liver disease), and Osteoporosis.  PSHx:  has a past surgical history that includes Appendectomy; Cholecystectomy; Salpingoophorectomy; pr right heart cath o2 saturation & cardiac output (Right, 01/18/2018); IR Tips (07/08/2018); pr upper gi endoscopy,diagnosis (N/A, 03/03/2019); pr upper gi endoscopy,ctrl bleed (N/A, 03/05/2019); pr upper gi endoscopy,ctrl bleed (04/04/2019); pr upper gi endoscopy,ctrl bleed (N/A, 05/23/2019); pr upper gi endoscopy,ctrl bleed (N/A, 06/13/2019); pr upper gi endoscopy,ctrl bleed (N/A, 07/11/2019); pr upper gi endoscopy,ctrl bleed (N/A, 09/16/2019); pr upper gi endoscopy,ctrl bleed (N/A, 10/07/2019); pr colsc flexible w/control bleeding any method (N/A, 10/17/2019); pr upper gi endoscopy,ligat varix (N/A, 01/02/2020); pr upper gi endoscopy,ctrl bleed (N/A, 01/02/2020); pr upper gi endoscopy,ligat varix (N/A, 04/09/2020); pr upper gi endoscopy,ctrl bleed (N/A, 04/09/2020); and pr upper gi endoscopy,ligat varix (N/A, 06/18/2020).  SocHx:  reports that she has never smoked. She has never used smokeless tobacco. She reports that she does not drink alcohol and does not use drugs.  Allergies, Medications, Medical, Surgical, and Social History were reviewed as documented above.    Review Of Systems    Review of Systems   Constitutional: Negative for chills and fever.   HENT: Negative for ear pain.    Respiratory: Negative for chest tightness and shortness of breath.    Cardiovascular: Negative for chest pain.   Gastrointestinal: Negative for abdominal pain.   Genitourinary: Negative for flank pain.   Musculoskeletal: Negative for back pain.   Skin: Negative for rash and wound.   Neurological: Negative for weakness.   Psychiatric/Behavioral: Negative for suicidal ideas.   All other systems reviewed and are negative.      Physical Exam     BP 150/48  - Pulse 88  - Temp 36.4 ??C (97.5 ??F)  - Resp 19  - LMP  (LMP Unknown)  - SpO2 92%     Physical Exam  Vitals and nursing note reviewed.   Constitutional:       Appearance: Normal appearance. She is not ill-appearing, toxic-appearing or diaphoretic.   HENT:      Head: Normocephalic and atraumatic.      Right Ear: Ear canal normal.      Left Ear: Ear canal normal.  Nose: Nose normal.      Mouth/Throat:      Mouth: Mucous membranes are moist.   Eyes:      Conjunctiva/sclera: Conjunctivae normal.   Cardiovascular:      Rate and Rhythm: Normal rate and regular rhythm.      Pulses: Normal pulses.      Heart sounds: Normal heart sounds.   Pulmonary:      Effort: Pulmonary effort is normal.      Breath sounds: Normal breath sounds.   Abdominal:      General: Bowel sounds are normal. There is no distension.      Palpations: Abdomen is soft.   Musculoskeletal:         General: Tenderness and signs of injury present. No deformity. Normal range of motion.      Cervical back: Neck supple.      Comments: Tenderness to the left lower extremity and hip    Skin:     General: Skin is warm and dry.      Capillary Refill: Capillary refill takes less than 2 seconds.   Neurological:      General: No focal deficit present.      Mental Status: She is alert and oriented to person, place, and time. Mental status is at baseline.   Psychiatric:         Mood and Affect: Mood normal.         Behavior: Behavior normal.         Thought Content: Thought content normal.         Judgment: Judgment normal.         ED Course   MDM     Left femoral neck fracture closed.   Transfer for definitive care by ortho.   Abnormal platelet count noted, thought to be clumping or lab error.   Await accepting.     Procedures     No results found for this visit on 07/22/20 (from the past 4464 hour(s)).    Coding         Linnell Fulling, MD  07/23/20 [id number]

## 2020-07-23 NOTE — Unmapped (Signed)
Return call placed and spoke with Hildred Priest. He reports Daniqua suffered a recent fall and subsequent femur / hip Fx. Currently admitted to [facility] and awaiting sx later today or tomorrow. Had long discussion with spouse re: POC and transplant likelihood s/p fracture. He was somewhat understanding and continues to be frustrated by allocation process given how sick his wife is. Provided active listening and emotional support. Also advised he inquire into palliative care with local team if deemed appropriate by he and his wife.

## 2020-07-24 DIAGNOSIS — Z96642 Presence of left artificial hip joint: Secondary | ICD-10-CM | POA: Diagnosis not present

## 2020-07-24 DIAGNOSIS — E7800 Pure hypercholesterolemia, unspecified: Secondary | ICD-10-CM | POA: Diagnosis not present

## 2020-07-24 DIAGNOSIS — S72002D Fracture of unspecified part of neck of left femur, subsequent encounter for closed fracture with routine healing: Secondary | ICD-10-CM | POA: Diagnosis not present

## 2020-07-24 DIAGNOSIS — M80052A Age-related osteoporosis with current pathological fracture, left femur, initial encounter for fracture: Secondary | ICD-10-CM | POA: Diagnosis not present

## 2020-07-24 DIAGNOSIS — D509 Iron deficiency anemia, unspecified: Secondary | ICD-10-CM | POA: Diagnosis not present

## 2020-07-24 DIAGNOSIS — I5030 Unspecified diastolic (congestive) heart failure: Secondary | ICD-10-CM | POA: Diagnosis not present

## 2020-07-24 DIAGNOSIS — J9601 Acute respiratory failure with hypoxia: Secondary | ICD-10-CM | POA: Diagnosis not present

## 2020-07-24 DIAGNOSIS — S72002A Fracture of unspecified part of neck of left femur, initial encounter for closed fracture: Secondary | ICD-10-CM | POA: Diagnosis not present

## 2020-07-26 ENCOUNTER — Encounter (HOSPITAL_COMMUNITY)
Admission: RE | Admit: 2020-07-26 | Discharge: 2020-07-26 | Disposition: A | Discharge status: Home / Self Care | Payer: PPO | Source: Ambulatory Visit | Attending: Internal Medicine | Admitting: Internal Medicine

## 2020-07-26 DIAGNOSIS — K7290 Hepatic failure, unspecified without coma: Principal | ICD-10-CM

## 2020-07-26 DIAGNOSIS — J9601 Acute respiratory failure with hypoxia: Secondary | ICD-10-CM | POA: Diagnosis not present

## 2020-07-26 DIAGNOSIS — D696 Thrombocytopenia, unspecified: Secondary | ICD-10-CM | POA: Diagnosis not present

## 2020-07-26 DIAGNOSIS — R2243 Localized swelling, mass and lump, lower limb, bilateral: Secondary | ICD-10-CM | POA: Diagnosis not present

## 2020-07-26 DIAGNOSIS — D509 Iron deficiency anemia, unspecified: Secondary | ICD-10-CM | POA: Diagnosis not present

## 2020-07-26 DIAGNOSIS — K31819 Angiodysplasia of stomach and duodenum without bleeding: Secondary | ICD-10-CM | POA: Diagnosis not present

## 2020-07-26 DIAGNOSIS — I5030 Unspecified diastolic (congestive) heart failure: Secondary | ICD-10-CM | POA: Diagnosis not present

## 2020-07-26 DIAGNOSIS — K7581 Nonalcoholic steatohepatitis (NASH): Secondary | ICD-10-CM | POA: Diagnosis not present

## 2020-07-26 DIAGNOSIS — E7800 Pure hypercholesterolemia, unspecified: Secondary | ICD-10-CM | POA: Diagnosis not present

## 2020-07-27 DIAGNOSIS — K7581 Nonalcoholic steatohepatitis (NASH): Secondary | ICD-10-CM | POA: Diagnosis not present

## 2020-07-27 DIAGNOSIS — D62 Acute posthemorrhagic anemia: Secondary | ICD-10-CM | POA: Diagnosis not present

## 2020-07-27 DIAGNOSIS — I8510 Secondary esophageal varices without bleeding: Secondary | ICD-10-CM | POA: Diagnosis not present

## 2020-07-27 DIAGNOSIS — R4182 Altered mental status, unspecified: Secondary | ICD-10-CM | POA: Diagnosis not present

## 2020-07-27 DIAGNOSIS — K31819 Angiodysplasia of stomach and duodenum without bleeding: Secondary | ICD-10-CM | POA: Diagnosis not present

## 2020-07-28 DIAGNOSIS — K7460 Unspecified cirrhosis of liver: Secondary | ICD-10-CM | POA: Diagnosis not present

## 2020-07-28 DIAGNOSIS — Z95828 Presence of other vascular implants and grafts: Secondary | ICD-10-CM | POA: Diagnosis not present

## 2020-07-28 DIAGNOSIS — R4182 Altered mental status, unspecified: Secondary | ICD-10-CM | POA: Diagnosis not present

## 2020-07-28 DIAGNOSIS — I8510 Secondary esophageal varices without bleeding: Secondary | ICD-10-CM | POA: Diagnosis not present

## 2020-07-28 DIAGNOSIS — K31819 Angiodysplasia of stomach and duodenum without bleeding: Secondary | ICD-10-CM | POA: Diagnosis not present

## 2020-07-28 DIAGNOSIS — D62 Acute posthemorrhagic anemia: Secondary | ICD-10-CM | POA: Diagnosis not present

## 2020-07-28 DIAGNOSIS — K7581 Nonalcoholic steatohepatitis (NASH): Secondary | ICD-10-CM | POA: Diagnosis not present

## 2020-07-29 DIAGNOSIS — E7800 Pure hypercholesterolemia, unspecified: Secondary | ICD-10-CM | POA: Diagnosis not present

## 2020-07-29 DIAGNOSIS — Z96642 Presence of left artificial hip joint: Secondary | ICD-10-CM | POA: Diagnosis not present

## 2020-07-29 DIAGNOSIS — N179 Acute kidney failure, unspecified: Secondary | ICD-10-CM | POA: Diagnosis not present

## 2020-07-29 DIAGNOSIS — D509 Iron deficiency anemia, unspecified: Secondary | ICD-10-CM | POA: Diagnosis not present

## 2020-07-29 DIAGNOSIS — E1129 Type 2 diabetes mellitus with other diabetic kidney complication: Secondary | ICD-10-CM | POA: Diagnosis not present

## 2020-07-29 DIAGNOSIS — I5032 Chronic diastolic (congestive) heart failure: Secondary | ICD-10-CM | POA: Diagnosis not present

## 2020-07-29 DIAGNOSIS — K760 Fatty (change of) liver, not elsewhere classified: Secondary | ICD-10-CM | POA: Diagnosis not present

## 2020-07-29 DIAGNOSIS — K921 Melena: Secondary | ICD-10-CM | POA: Diagnosis not present

## 2020-07-29 DIAGNOSIS — E1165 Type 2 diabetes mellitus with hyperglycemia: Secondary | ICD-10-CM | POA: Diagnosis not present

## 2020-07-29 DIAGNOSIS — F32A Depression, unspecified: Secondary | ICD-10-CM | POA: Diagnosis not present

## 2020-07-29 DIAGNOSIS — E039 Hypothyroidism, unspecified: Secondary | ICD-10-CM | POA: Diagnosis not present

## 2020-07-29 DIAGNOSIS — S72002A Fracture of unspecified part of neck of left femur, initial encounter for closed fracture: Secondary | ICD-10-CM | POA: Diagnosis not present

## 2020-07-29 DIAGNOSIS — Z794 Long term (current) use of insulin: Secondary | ICD-10-CM | POA: Diagnosis not present

## 2020-07-29 DIAGNOSIS — K922 Gastrointestinal hemorrhage, unspecified: Secondary | ICD-10-CM | POA: Diagnosis not present

## 2020-07-30 DIAGNOSIS — E1129 Type 2 diabetes mellitus with other diabetic kidney complication: Secondary | ICD-10-CM | POA: Diagnosis not present

## 2020-07-30 DIAGNOSIS — K2289 Other specified disease of esophagus: Secondary | ICD-10-CM | POA: Diagnosis not present

## 2020-07-30 DIAGNOSIS — E039 Hypothyroidism, unspecified: Secondary | ICD-10-CM | POA: Diagnosis not present

## 2020-07-30 DIAGNOSIS — K921 Melena: Secondary | ICD-10-CM | POA: Diagnosis not present

## 2020-07-30 DIAGNOSIS — N179 Acute kidney failure, unspecified: Secondary | ICD-10-CM | POA: Diagnosis not present

## 2020-07-30 DIAGNOSIS — K766 Portal hypertension: Secondary | ICD-10-CM | POA: Diagnosis not present

## 2020-07-30 DIAGNOSIS — K760 Fatty (change of) liver, not elsewhere classified: Secondary | ICD-10-CM | POA: Diagnosis not present

## 2020-07-30 DIAGNOSIS — E7800 Pure hypercholesterolemia, unspecified: Secondary | ICD-10-CM | POA: Diagnosis not present

## 2020-07-30 DIAGNOSIS — I5032 Chronic diastolic (congestive) heart failure: Secondary | ICD-10-CM | POA: Diagnosis not present

## 2020-07-30 DIAGNOSIS — F32A Depression, unspecified: Secondary | ICD-10-CM | POA: Diagnosis not present

## 2020-07-30 DIAGNOSIS — K31811 Angiodysplasia of stomach and duodenum with bleeding: Secondary | ICD-10-CM | POA: Diagnosis not present

## 2020-07-30 DIAGNOSIS — K3189 Other diseases of stomach and duodenum: Secondary | ICD-10-CM | POA: Diagnosis not present

## 2020-07-30 DIAGNOSIS — Z794 Long term (current) use of insulin: Secondary | ICD-10-CM | POA: Diagnosis not present

## 2020-07-30 DIAGNOSIS — K922 Gastrointestinal hemorrhage, unspecified: Secondary | ICD-10-CM | POA: Diagnosis not present

## 2020-07-30 DIAGNOSIS — K317 Polyp of stomach and duodenum: Secondary | ICD-10-CM | POA: Diagnosis not present

## 2020-07-30 DIAGNOSIS — Z96642 Presence of left artificial hip joint: Secondary | ICD-10-CM | POA: Diagnosis not present

## 2020-07-30 DIAGNOSIS — E1165 Type 2 diabetes mellitus with hyperglycemia: Secondary | ICD-10-CM | POA: Diagnosis not present

## 2020-07-30 DIAGNOSIS — S72002A Fracture of unspecified part of neck of left femur, initial encounter for closed fracture: Secondary | ICD-10-CM | POA: Diagnosis not present

## 2020-07-31 DIAGNOSIS — S72002A Fracture of unspecified part of neck of left femur, initial encounter for closed fracture: Secondary | ICD-10-CM | POA: Diagnosis not present

## 2020-07-31 DIAGNOSIS — Z96642 Presence of left artificial hip joint: Secondary | ICD-10-CM | POA: Diagnosis not present

## 2020-07-31 DIAGNOSIS — K922 Gastrointestinal hemorrhage, unspecified: Secondary | ICD-10-CM | POA: Diagnosis not present

## 2020-07-31 DIAGNOSIS — F32A Depression, unspecified: Secondary | ICD-10-CM | POA: Diagnosis not present

## 2020-07-31 DIAGNOSIS — N179 Acute kidney failure, unspecified: Secondary | ICD-10-CM | POA: Diagnosis not present

## 2020-07-31 DIAGNOSIS — I5032 Chronic diastolic (congestive) heart failure: Secondary | ICD-10-CM | POA: Diagnosis not present

## 2020-07-31 DIAGNOSIS — Z794 Long term (current) use of insulin: Secondary | ICD-10-CM | POA: Diagnosis not present

## 2020-07-31 DIAGNOSIS — E039 Hypothyroidism, unspecified: Secondary | ICD-10-CM | POA: Diagnosis not present

## 2020-07-31 DIAGNOSIS — K760 Fatty (change of) liver, not elsewhere classified: Secondary | ICD-10-CM | POA: Diagnosis not present

## 2020-07-31 DIAGNOSIS — E1129 Type 2 diabetes mellitus with other diabetic kidney complication: Secondary | ICD-10-CM | POA: Diagnosis not present

## 2020-07-31 DIAGNOSIS — E7800 Pure hypercholesterolemia, unspecified: Secondary | ICD-10-CM | POA: Diagnosis not present

## 2020-08-01 DIAGNOSIS — N179 Acute kidney failure, unspecified: Secondary | ICD-10-CM | POA: Diagnosis not present

## 2020-08-01 DIAGNOSIS — Z794 Long term (current) use of insulin: Secondary | ICD-10-CM | POA: Diagnosis not present

## 2020-08-01 DIAGNOSIS — Z96642 Presence of left artificial hip joint: Secondary | ICD-10-CM | POA: Diagnosis not present

## 2020-08-01 DIAGNOSIS — D509 Iron deficiency anemia, unspecified: Secondary | ICD-10-CM | POA: Diagnosis not present

## 2020-08-01 DIAGNOSIS — K766 Portal hypertension: Secondary | ICD-10-CM | POA: Diagnosis not present

## 2020-08-01 DIAGNOSIS — E039 Hypothyroidism, unspecified: Secondary | ICD-10-CM | POA: Diagnosis not present

## 2020-08-01 DIAGNOSIS — K31819 Angiodysplasia of stomach and duodenum without bleeding: Secondary | ICD-10-CM | POA: Diagnosis not present

## 2020-08-01 DIAGNOSIS — E7800 Pure hypercholesterolemia, unspecified: Secondary | ICD-10-CM | POA: Diagnosis not present

## 2020-08-01 DIAGNOSIS — K921 Melena: Secondary | ICD-10-CM | POA: Diagnosis not present

## 2020-08-01 DIAGNOSIS — K760 Fatty (change of) liver, not elsewhere classified: Secondary | ICD-10-CM | POA: Diagnosis not present

## 2020-08-01 DIAGNOSIS — K922 Gastrointestinal hemorrhage, unspecified: Secondary | ICD-10-CM | POA: Diagnosis not present

## 2020-08-01 DIAGNOSIS — S72002A Fracture of unspecified part of neck of left femur, initial encounter for closed fracture: Secondary | ICD-10-CM | POA: Diagnosis not present

## 2020-08-01 DIAGNOSIS — K7460 Unspecified cirrhosis of liver: Secondary | ICD-10-CM | POA: Diagnosis not present

## 2020-08-01 DIAGNOSIS — E1129 Type 2 diabetes mellitus with other diabetic kidney complication: Secondary | ICD-10-CM | POA: Diagnosis not present

## 2020-08-01 DIAGNOSIS — F32A Depression, unspecified: Secondary | ICD-10-CM | POA: Diagnosis not present

## 2020-08-01 DIAGNOSIS — I5032 Chronic diastolic (congestive) heart failure: Secondary | ICD-10-CM | POA: Diagnosis not present

## 2020-08-02 DIAGNOSIS — K922 Gastrointestinal hemorrhage, unspecified: Secondary | ICD-10-CM | POA: Diagnosis not present

## 2020-08-02 DIAGNOSIS — K760 Fatty (change of) liver, not elsewhere classified: Secondary | ICD-10-CM | POA: Diagnosis not present

## 2020-08-02 DIAGNOSIS — E1159 Type 2 diabetes mellitus with other circulatory complications: Secondary | ICD-10-CM | POA: Diagnosis not present

## 2020-08-02 DIAGNOSIS — E1169 Type 2 diabetes mellitus with other specified complication: Secondary | ICD-10-CM | POA: Diagnosis not present

## 2020-08-02 DIAGNOSIS — F32A Depression, unspecified: Secondary | ICD-10-CM | POA: Diagnosis not present

## 2020-08-02 DIAGNOSIS — Z794 Long term (current) use of insulin: Secondary | ICD-10-CM | POA: Diagnosis not present

## 2020-08-02 DIAGNOSIS — E039 Hypothyroidism, unspecified: Secondary | ICD-10-CM | POA: Diagnosis not present

## 2020-08-02 DIAGNOSIS — Z6836 Body mass index (BMI) 36.0-36.9, adult: Secondary | ICD-10-CM | POA: Diagnosis not present

## 2020-08-02 DIAGNOSIS — I5032 Chronic diastolic (congestive) heart failure: Secondary | ICD-10-CM | POA: Diagnosis not present

## 2020-08-02 DIAGNOSIS — E7800 Pure hypercholesterolemia, unspecified: Secondary | ICD-10-CM | POA: Diagnosis not present

## 2020-08-02 DIAGNOSIS — E118 Type 2 diabetes mellitus with unspecified complications: Secondary | ICD-10-CM | POA: Diagnosis not present

## 2020-08-02 DIAGNOSIS — N179 Acute kidney failure, unspecified: Secondary | ICD-10-CM | POA: Diagnosis not present

## 2020-08-02 DIAGNOSIS — Z96642 Presence of left artificial hip joint: Secondary | ICD-10-CM | POA: Diagnosis not present

## 2020-08-02 DIAGNOSIS — E1129 Type 2 diabetes mellitus with other diabetic kidney complication: Secondary | ICD-10-CM | POA: Diagnosis not present

## 2020-08-02 DIAGNOSIS — S72002A Fracture of unspecified part of neck of left femur, initial encounter for closed fracture: Secondary | ICD-10-CM | POA: Diagnosis not present

## 2020-08-02 DIAGNOSIS — E669 Obesity, unspecified: Secondary | ICD-10-CM | POA: Diagnosis not present

## 2020-08-03 DIAGNOSIS — D61818 Other pancytopenia: Secondary | ICD-10-CM | POA: Diagnosis not present

## 2020-08-03 DIAGNOSIS — E1165 Type 2 diabetes mellitus with hyperglycemia: Secondary | ICD-10-CM | POA: Diagnosis not present

## 2020-08-03 DIAGNOSIS — E1129 Type 2 diabetes mellitus with other diabetic kidney complication: Secondary | ICD-10-CM | POA: Diagnosis not present

## 2020-08-03 DIAGNOSIS — N179 Acute kidney failure, unspecified: Secondary | ICD-10-CM | POA: Diagnosis not present

## 2020-08-03 DIAGNOSIS — Z794 Long term (current) use of insulin: Secondary | ICD-10-CM | POA: Diagnosis not present

## 2020-08-03 DIAGNOSIS — E1169 Type 2 diabetes mellitus with other specified complication: Secondary | ICD-10-CM | POA: Diagnosis not present

## 2020-08-03 DIAGNOSIS — E669 Obesity, unspecified: Secondary | ICD-10-CM | POA: Diagnosis not present

## 2020-08-03 DIAGNOSIS — E119 Type 2 diabetes mellitus without complications: Secondary | ICD-10-CM | POA: Diagnosis not present

## 2020-08-03 DIAGNOSIS — Z6836 Body mass index (BMI) 36.0-36.9, adult: Secondary | ICD-10-CM | POA: Diagnosis not present

## 2020-08-03 DIAGNOSIS — N183 Chronic kidney disease, stage 3 unspecified: Secondary | ICD-10-CM | POA: Diagnosis not present

## 2020-08-03 DIAGNOSIS — K922 Gastrointestinal hemorrhage, unspecified: Secondary | ICD-10-CM | POA: Diagnosis not present

## 2020-08-04 DIAGNOSIS — N183 Chronic kidney disease, stage 3 unspecified: Secondary | ICD-10-CM | POA: Diagnosis not present

## 2020-08-04 DIAGNOSIS — K922 Gastrointestinal hemorrhage, unspecified: Secondary | ICD-10-CM | POA: Diagnosis not present

## 2020-08-04 DIAGNOSIS — N179 Acute kidney failure, unspecified: Secondary | ICD-10-CM | POA: Diagnosis not present

## 2020-08-04 DIAGNOSIS — E119 Type 2 diabetes mellitus without complications: Secondary | ICD-10-CM | POA: Diagnosis not present

## 2020-08-04 DIAGNOSIS — D61818 Other pancytopenia: Secondary | ICD-10-CM | POA: Diagnosis not present

## 2020-08-05 DIAGNOSIS — D61818 Other pancytopenia: Secondary | ICD-10-CM | POA: Diagnosis not present

## 2020-08-05 DIAGNOSIS — N183 Chronic kidney disease, stage 3 unspecified: Secondary | ICD-10-CM | POA: Diagnosis not present

## 2020-08-05 DIAGNOSIS — E119 Type 2 diabetes mellitus without complications: Secondary | ICD-10-CM | POA: Diagnosis not present

## 2020-08-05 DIAGNOSIS — K922 Gastrointestinal hemorrhage, unspecified: Secondary | ICD-10-CM | POA: Diagnosis not present

## 2020-08-05 DIAGNOSIS — N179 Acute kidney failure, unspecified: Secondary | ICD-10-CM | POA: Diagnosis not present

## 2020-08-06 DIAGNOSIS — E119 Type 2 diabetes mellitus without complications: Secondary | ICD-10-CM | POA: Diagnosis not present

## 2020-08-06 DIAGNOSIS — Z794 Long term (current) use of insulin: Secondary | ICD-10-CM | POA: Diagnosis not present

## 2020-08-06 DIAGNOSIS — D61818 Other pancytopenia: Secondary | ICD-10-CM | POA: Diagnosis not present

## 2020-08-06 DIAGNOSIS — N183 Chronic kidney disease, stage 3 unspecified: Secondary | ICD-10-CM | POA: Diagnosis not present

## 2020-08-06 DIAGNOSIS — K922 Gastrointestinal hemorrhage, unspecified: Secondary | ICD-10-CM | POA: Diagnosis not present

## 2020-08-06 DIAGNOSIS — N179 Acute kidney failure, unspecified: Secondary | ICD-10-CM | POA: Diagnosis not present

## 2020-08-06 DIAGNOSIS — Z6838 Body mass index (BMI) 38.0-38.9, adult: Secondary | ICD-10-CM | POA: Diagnosis not present

## 2020-08-06 DIAGNOSIS — E1169 Type 2 diabetes mellitus with other specified complication: Secondary | ICD-10-CM | POA: Diagnosis not present

## 2020-08-06 DIAGNOSIS — E669 Obesity, unspecified: Secondary | ICD-10-CM | POA: Diagnosis not present

## 2020-08-06 DIAGNOSIS — E1165 Type 2 diabetes mellitus with hyperglycemia: Secondary | ICD-10-CM | POA: Diagnosis not present

## 2020-08-07 DIAGNOSIS — K922 Gastrointestinal hemorrhage, unspecified: Secondary | ICD-10-CM | POA: Diagnosis not present

## 2020-08-07 DIAGNOSIS — S72002D Fracture of unspecified part of neck of left femur, subsequent encounter for closed fracture with routine healing: Secondary | ICD-10-CM | POA: Diagnosis not present

## 2020-08-07 DIAGNOSIS — E039 Hypothyroidism, unspecified: Secondary | ICD-10-CM | POA: Diagnosis not present

## 2020-08-07 DIAGNOSIS — N179 Acute kidney failure, unspecified: Secondary | ICD-10-CM | POA: Diagnosis not present

## 2020-08-07 DIAGNOSIS — I5030 Unspecified diastolic (congestive) heart failure: Secondary | ICD-10-CM | POA: Diagnosis not present

## 2020-08-07 DIAGNOSIS — E7800 Pure hypercholesterolemia, unspecified: Secondary | ICD-10-CM | POA: Diagnosis not present

## 2020-08-07 DIAGNOSIS — F32A Depression, unspecified: Secondary | ICD-10-CM | POA: Diagnosis not present

## 2020-08-07 DIAGNOSIS — D61818 Other pancytopenia: Secondary | ICD-10-CM | POA: Diagnosis not present

## 2020-08-07 DIAGNOSIS — N183 Chronic kidney disease, stage 3 unspecified: Secondary | ICD-10-CM | POA: Diagnosis not present

## 2020-08-07 DIAGNOSIS — E1122 Type 2 diabetes mellitus with diabetic chronic kidney disease: Secondary | ICD-10-CM | POA: Diagnosis not present

## 2020-08-08 DIAGNOSIS — N189 Chronic kidney disease, unspecified: Secondary | ICD-10-CM | POA: Diagnosis not present

## 2020-08-08 DIAGNOSIS — E1122 Type 2 diabetes mellitus with diabetic chronic kidney disease: Secondary | ICD-10-CM | POA: Diagnosis not present

## 2020-08-08 DIAGNOSIS — S72002D Fracture of unspecified part of neck of left femur, subsequent encounter for closed fracture with routine healing: Secondary | ICD-10-CM | POA: Diagnosis not present

## 2020-08-08 DIAGNOSIS — K7581 Nonalcoholic steatohepatitis (NASH): Secondary | ICD-10-CM | POA: Diagnosis not present

## 2020-08-08 DIAGNOSIS — K922 Gastrointestinal hemorrhage, unspecified: Secondary | ICD-10-CM | POA: Diagnosis not present

## 2020-08-08 DIAGNOSIS — N183 Chronic kidney disease, stage 3 unspecified: Secondary | ICD-10-CM | POA: Diagnosis not present

## 2020-08-08 DIAGNOSIS — N179 Acute kidney failure, unspecified: Secondary | ICD-10-CM | POA: Diagnosis not present

## 2020-08-08 DIAGNOSIS — I5030 Unspecified diastolic (congestive) heart failure: Secondary | ICD-10-CM | POA: Diagnosis not present

## 2020-08-08 DIAGNOSIS — E1165 Type 2 diabetes mellitus with hyperglycemia: Secondary | ICD-10-CM | POA: Diagnosis not present

## 2020-08-08 DIAGNOSIS — D62 Acute posthemorrhagic anemia: Secondary | ICD-10-CM | POA: Diagnosis not present

## 2020-08-08 DIAGNOSIS — D61818 Other pancytopenia: Secondary | ICD-10-CM | POA: Diagnosis not present

## 2020-08-08 DIAGNOSIS — K31811 Angiodysplasia of stomach and duodenum with bleeding: Secondary | ICD-10-CM | POA: Diagnosis not present

## 2020-08-09 DIAGNOSIS — K922 Gastrointestinal hemorrhage, unspecified: Secondary | ICD-10-CM | POA: Diagnosis not present

## 2020-08-09 DIAGNOSIS — E1165 Type 2 diabetes mellitus with hyperglycemia: Secondary | ICD-10-CM | POA: Diagnosis not present

## 2020-08-09 DIAGNOSIS — I5030 Unspecified diastolic (congestive) heart failure: Secondary | ICD-10-CM | POA: Diagnosis not present

## 2020-08-09 DIAGNOSIS — E1169 Type 2 diabetes mellitus with other specified complication: Secondary | ICD-10-CM | POA: Diagnosis not present

## 2020-08-09 DIAGNOSIS — K31811 Angiodysplasia of stomach and duodenum with bleeding: Secondary | ICD-10-CM | POA: Diagnosis not present

## 2020-08-09 DIAGNOSIS — N179 Acute kidney failure, unspecified: Secondary | ICD-10-CM | POA: Diagnosis not present

## 2020-08-09 DIAGNOSIS — S72002D Fracture of unspecified part of neck of left femur, subsequent encounter for closed fracture with routine healing: Secondary | ICD-10-CM | POA: Diagnosis not present

## 2020-08-09 DIAGNOSIS — D62 Acute posthemorrhagic anemia: Secondary | ICD-10-CM | POA: Diagnosis not present

## 2020-08-09 DIAGNOSIS — E669 Obesity, unspecified: Secondary | ICD-10-CM | POA: Diagnosis not present

## 2020-08-09 DIAGNOSIS — E1122 Type 2 diabetes mellitus with diabetic chronic kidney disease: Secondary | ICD-10-CM | POA: Diagnosis not present

## 2020-08-09 DIAGNOSIS — D61818 Other pancytopenia: Secondary | ICD-10-CM | POA: Diagnosis not present

## 2020-08-09 DIAGNOSIS — N183 Chronic kidney disease, stage 3 unspecified: Secondary | ICD-10-CM | POA: Diagnosis not present

## 2020-08-09 DIAGNOSIS — K7581 Nonalcoholic steatohepatitis (NASH): Secondary | ICD-10-CM | POA: Diagnosis not present

## 2020-08-09 DIAGNOSIS — Z6838 Body mass index (BMI) 38.0-38.9, adult: Secondary | ICD-10-CM | POA: Diagnosis not present

## 2020-08-09 DIAGNOSIS — Z794 Long term (current) use of insulin: Secondary | ICD-10-CM | POA: Diagnosis not present

## 2020-08-09 DIAGNOSIS — N189 Chronic kidney disease, unspecified: Secondary | ICD-10-CM | POA: Diagnosis not present

## 2020-08-10 DIAGNOSIS — D62 Acute posthemorrhagic anemia: Secondary | ICD-10-CM | POA: Diagnosis not present

## 2020-08-10 DIAGNOSIS — E8770 Fluid overload, unspecified: Secondary | ICD-10-CM | POA: Diagnosis not present

## 2020-08-10 DIAGNOSIS — E1169 Type 2 diabetes mellitus with other specified complication: Secondary | ICD-10-CM | POA: Diagnosis not present

## 2020-08-10 DIAGNOSIS — K7581 Nonalcoholic steatohepatitis (NASH): Secondary | ICD-10-CM | POA: Diagnosis not present

## 2020-08-10 DIAGNOSIS — I5030 Unspecified diastolic (congestive) heart failure: Secondary | ICD-10-CM | POA: Diagnosis not present

## 2020-08-10 DIAGNOSIS — F32A Depression, unspecified: Secondary | ICD-10-CM | POA: Diagnosis not present

## 2020-08-10 DIAGNOSIS — E1122 Type 2 diabetes mellitus with diabetic chronic kidney disease: Secondary | ICD-10-CM | POA: Diagnosis not present

## 2020-08-10 DIAGNOSIS — N183 Chronic kidney disease, stage 3 unspecified: Secondary | ICD-10-CM | POA: Diagnosis not present

## 2020-08-10 DIAGNOSIS — K7469 Other cirrhosis of liver: Secondary | ICD-10-CM | POA: Diagnosis not present

## 2020-08-10 DIAGNOSIS — Z794 Long term (current) use of insulin: Secondary | ICD-10-CM | POA: Diagnosis not present

## 2020-08-10 DIAGNOSIS — E669 Obesity, unspecified: Secondary | ICD-10-CM | POA: Diagnosis not present

## 2020-08-10 DIAGNOSIS — D61818 Other pancytopenia: Secondary | ICD-10-CM | POA: Diagnosis not present

## 2020-08-10 DIAGNOSIS — S7292XA Unspecified fracture of left femur, initial encounter for closed fracture: Secondary | ICD-10-CM | POA: Diagnosis not present

## 2020-08-10 DIAGNOSIS — S7292XD Unspecified fracture of left femur, subsequent encounter for closed fracture with routine healing: Secondary | ICD-10-CM | POA: Diagnosis not present

## 2020-08-10 DIAGNOSIS — E872 Acidosis: Secondary | ICD-10-CM | POA: Diagnosis not present

## 2020-08-10 DIAGNOSIS — E1165 Type 2 diabetes mellitus with hyperglycemia: Secondary | ICD-10-CM | POA: Diagnosis not present

## 2020-08-10 DIAGNOSIS — N179 Acute kidney failure, unspecified: Secondary | ICD-10-CM | POA: Diagnosis not present

## 2020-08-10 DIAGNOSIS — E7800 Pure hypercholesterolemia, unspecified: Secondary | ICD-10-CM | POA: Diagnosis not present

## 2020-08-10 DIAGNOSIS — K922 Gastrointestinal hemorrhage, unspecified: Secondary | ICD-10-CM | POA: Diagnosis not present

## 2020-08-10 DIAGNOSIS — Z6838 Body mass index (BMI) 38.0-38.9, adult: Secondary | ICD-10-CM | POA: Diagnosis not present

## 2020-08-10 DIAGNOSIS — R161 Splenomegaly, not elsewhere classified: Secondary | ICD-10-CM | POA: Diagnosis not present

## 2020-08-10 DIAGNOSIS — E039 Hypothyroidism, unspecified: Secondary | ICD-10-CM | POA: Diagnosis not present

## 2020-08-11 DIAGNOSIS — Z6838 Body mass index (BMI) 38.0-38.9, adult: Secondary | ICD-10-CM | POA: Diagnosis not present

## 2020-08-11 DIAGNOSIS — E1169 Type 2 diabetes mellitus with other specified complication: Secondary | ICD-10-CM | POA: Diagnosis not present

## 2020-08-11 DIAGNOSIS — K7469 Other cirrhosis of liver: Secondary | ICD-10-CM | POA: Diagnosis not present

## 2020-08-11 DIAGNOSIS — E039 Hypothyroidism, unspecified: Secondary | ICD-10-CM | POA: Diagnosis not present

## 2020-08-11 DIAGNOSIS — E1122 Type 2 diabetes mellitus with diabetic chronic kidney disease: Secondary | ICD-10-CM | POA: Diagnosis not present

## 2020-08-11 DIAGNOSIS — E669 Obesity, unspecified: Secondary | ICD-10-CM | POA: Diagnosis not present

## 2020-08-11 DIAGNOSIS — E1165 Type 2 diabetes mellitus with hyperglycemia: Secondary | ICD-10-CM | POA: Diagnosis not present

## 2020-08-11 DIAGNOSIS — N179 Acute kidney failure, unspecified: Secondary | ICD-10-CM | POA: Diagnosis not present

## 2020-08-11 DIAGNOSIS — E872 Acidosis: Secondary | ICD-10-CM | POA: Diagnosis not present

## 2020-08-11 DIAGNOSIS — S7292XA Unspecified fracture of left femur, initial encounter for closed fracture: Secondary | ICD-10-CM | POA: Diagnosis not present

## 2020-08-11 DIAGNOSIS — D61818 Other pancytopenia: Secondary | ICD-10-CM | POA: Diagnosis not present

## 2020-08-11 DIAGNOSIS — D62 Acute posthemorrhagic anemia: Secondary | ICD-10-CM | POA: Diagnosis not present

## 2020-08-11 DIAGNOSIS — Z794 Long term (current) use of insulin: Secondary | ICD-10-CM | POA: Diagnosis not present

## 2020-08-11 DIAGNOSIS — N183 Chronic kidney disease, stage 3 unspecified: Secondary | ICD-10-CM | POA: Diagnosis not present

## 2020-08-11 DIAGNOSIS — E8770 Fluid overload, unspecified: Secondary | ICD-10-CM | POA: Diagnosis not present

## 2020-08-11 DIAGNOSIS — F32A Depression, unspecified: Secondary | ICD-10-CM | POA: Diagnosis not present

## 2020-08-11 DIAGNOSIS — K922 Gastrointestinal hemorrhage, unspecified: Secondary | ICD-10-CM | POA: Diagnosis not present

## 2020-08-11 DIAGNOSIS — I5030 Unspecified diastolic (congestive) heart failure: Secondary | ICD-10-CM | POA: Diagnosis not present

## 2020-08-11 DIAGNOSIS — E7800 Pure hypercholesterolemia, unspecified: Secondary | ICD-10-CM | POA: Diagnosis not present

## 2020-08-11 DIAGNOSIS — S7292XD Unspecified fracture of left femur, subsequent encounter for closed fracture with routine healing: Secondary | ICD-10-CM | POA: Diagnosis not present

## 2020-08-12 DIAGNOSIS — S7292XA Unspecified fracture of left femur, initial encounter for closed fracture: Secondary | ICD-10-CM | POA: Diagnosis not present

## 2020-08-12 DIAGNOSIS — E8770 Fluid overload, unspecified: Secondary | ICD-10-CM | POA: Diagnosis not present

## 2020-08-12 DIAGNOSIS — I5030 Unspecified diastolic (congestive) heart failure: Secondary | ICD-10-CM | POA: Diagnosis not present

## 2020-08-12 DIAGNOSIS — E872 Acidosis: Secondary | ICD-10-CM | POA: Diagnosis not present

## 2020-08-12 DIAGNOSIS — E039 Hypothyroidism, unspecified: Secondary | ICD-10-CM | POA: Diagnosis not present

## 2020-08-12 DIAGNOSIS — E1122 Type 2 diabetes mellitus with diabetic chronic kidney disease: Secondary | ICD-10-CM | POA: Diagnosis not present

## 2020-08-12 DIAGNOSIS — S72002D Fracture of unspecified part of neck of left femur, subsequent encounter for closed fracture with routine healing: Secondary | ICD-10-CM | POA: Diagnosis not present

## 2020-08-12 DIAGNOSIS — F32A Depression, unspecified: Secondary | ICD-10-CM | POA: Diagnosis not present

## 2020-08-12 DIAGNOSIS — K922 Gastrointestinal hemorrhage, unspecified: Secondary | ICD-10-CM | POA: Diagnosis not present

## 2020-08-12 DIAGNOSIS — D62 Acute posthemorrhagic anemia: Secondary | ICD-10-CM | POA: Diagnosis not present

## 2020-08-12 DIAGNOSIS — K7469 Other cirrhosis of liver: Secondary | ICD-10-CM | POA: Diagnosis not present

## 2020-08-12 DIAGNOSIS — E7800 Pure hypercholesterolemia, unspecified: Secondary | ICD-10-CM | POA: Diagnosis not present

## 2020-08-12 DIAGNOSIS — N183 Chronic kidney disease, stage 3 unspecified: Secondary | ICD-10-CM | POA: Diagnosis not present

## 2020-08-12 DIAGNOSIS — N179 Acute kidney failure, unspecified: Secondary | ICD-10-CM | POA: Diagnosis not present

## 2020-08-12 DIAGNOSIS — D61818 Other pancytopenia: Secondary | ICD-10-CM | POA: Diagnosis not present

## 2020-08-13 DIAGNOSIS — K7210 Chronic hepatic failure without coma: Secondary | ICD-10-CM | POA: Diagnosis not present

## 2020-08-13 DIAGNOSIS — F322 Major depressive disorder, single episode, severe without psychotic features: Secondary | ICD-10-CM | POA: Diagnosis not present

## 2020-08-13 DIAGNOSIS — R41841 Cognitive communication deficit: Secondary | ICD-10-CM | POA: Diagnosis not present

## 2020-08-13 DIAGNOSIS — K7581 Nonalcoholic steatohepatitis (NASH): Secondary | ICD-10-CM | POA: Diagnosis not present

## 2020-08-13 DIAGNOSIS — Z79890 Hormone replacement therapy (postmenopausal): Secondary | ICD-10-CM | POA: Diagnosis not present

## 2020-08-13 DIAGNOSIS — R404 Transient alteration of awareness: Secondary | ICD-10-CM | POA: Diagnosis not present

## 2020-08-13 DIAGNOSIS — D509 Iron deficiency anemia, unspecified: Secondary | ICD-10-CM | POA: Diagnosis not present

## 2020-08-13 DIAGNOSIS — Z800 Family history of malignant neoplasm of digestive organs: Secondary | ICD-10-CM | POA: Diagnosis not present

## 2020-08-13 DIAGNOSIS — S72002A Fracture of unspecified part of neck of left femur, initial encounter for closed fracture: Secondary | ICD-10-CM | POA: Diagnosis not present

## 2020-08-13 DIAGNOSIS — E1122 Type 2 diabetes mellitus with diabetic chronic kidney disease: Secondary | ICD-10-CM | POA: Diagnosis not present

## 2020-08-13 DIAGNOSIS — I5032 Chronic diastolic (congestive) heart failure: Secondary | ICD-10-CM | POA: Diagnosis not present

## 2020-08-13 DIAGNOSIS — S72002S Fracture of unspecified part of neck of left femur, sequela: Secondary | ICD-10-CM | POA: Diagnosis not present

## 2020-08-13 DIAGNOSIS — I509 Heart failure, unspecified: Secondary | ICD-10-CM | POA: Diagnosis not present

## 2020-08-13 DIAGNOSIS — I110 Hypertensive heart disease with heart failure: Secondary | ICD-10-CM | POA: Diagnosis not present

## 2020-08-13 DIAGNOSIS — R2689 Other abnormalities of gait and mobility: Secondary | ICD-10-CM | POA: Diagnosis not present

## 2020-08-13 DIAGNOSIS — E860 Dehydration: Secondary | ICD-10-CM | POA: Diagnosis not present

## 2020-08-13 DIAGNOSIS — W19XXXA Unspecified fall, initial encounter: Secondary | ICD-10-CM | POA: Diagnosis not present

## 2020-08-13 DIAGNOSIS — M2550 Pain in unspecified joint: Secondary | ICD-10-CM | POA: Diagnosis not present

## 2020-08-13 DIAGNOSIS — R4182 Altered mental status, unspecified: Secondary | ICD-10-CM | POA: Diagnosis not present

## 2020-08-13 DIAGNOSIS — Z7401 Bed confinement status: Secondary | ICD-10-CM | POA: Diagnosis not present

## 2020-08-13 DIAGNOSIS — K7290 Hepatic failure, unspecified without coma: Secondary | ICD-10-CM | POA: Diagnosis not present

## 2020-08-13 DIAGNOSIS — N183 Chronic kidney disease, stage 3 unspecified: Secondary | ICD-10-CM | POA: Diagnosis not present

## 2020-08-13 DIAGNOSIS — I272 Pulmonary hypertension, unspecified: Secondary | ICD-10-CM | POA: Diagnosis not present

## 2020-08-13 DIAGNOSIS — S72002D Fracture of unspecified part of neck of left femur, subsequent encounter for closed fracture with routine healing: Secondary | ICD-10-CM | POA: Diagnosis not present

## 2020-08-13 DIAGNOSIS — Z794 Long term (current) use of insulin: Secondary | ICD-10-CM | POA: Diagnosis not present

## 2020-08-13 DIAGNOSIS — I10 Essential (primary) hypertension: Secondary | ICD-10-CM | POA: Diagnosis not present

## 2020-08-13 DIAGNOSIS — Z96642 Presence of left artificial hip joint: Secondary | ICD-10-CM | POA: Diagnosis not present

## 2020-08-13 DIAGNOSIS — Z299 Encounter for prophylactic measures, unspecified: Secondary | ICD-10-CM | POA: Diagnosis not present

## 2020-08-13 DIAGNOSIS — F419 Anxiety disorder, unspecified: Secondary | ICD-10-CM | POA: Diagnosis not present

## 2020-08-13 DIAGNOSIS — K31819 Angiodysplasia of stomach and duodenum without bleeding: Secondary | ICD-10-CM | POA: Diagnosis not present

## 2020-08-13 DIAGNOSIS — I2510 Atherosclerotic heart disease of native coronary artery without angina pectoris: Secondary | ICD-10-CM | POA: Diagnosis not present

## 2020-08-13 DIAGNOSIS — N189 Chronic kidney disease, unspecified: Secondary | ICD-10-CM | POA: Diagnosis not present

## 2020-08-13 DIAGNOSIS — Z8719 Personal history of other diseases of the digestive system: Secondary | ICD-10-CM | POA: Diagnosis not present

## 2020-08-13 DIAGNOSIS — E039 Hypothyroidism, unspecified: Secondary | ICD-10-CM | POA: Diagnosis not present

## 2020-08-13 DIAGNOSIS — Z833 Family history of diabetes mellitus: Secondary | ICD-10-CM | POA: Diagnosis not present

## 2020-08-13 DIAGNOSIS — E1165 Type 2 diabetes mellitus with hyperglycemia: Secondary | ICD-10-CM | POA: Diagnosis not present

## 2020-08-13 DIAGNOSIS — I959 Hypotension, unspecified: Secondary | ICD-10-CM | POA: Diagnosis not present

## 2020-08-13 DIAGNOSIS — Z79899 Other long term (current) drug therapy: Secondary | ICD-10-CM | POA: Diagnosis not present

## 2020-08-13 DIAGNOSIS — I8500 Esophageal varices without bleeding: Secondary | ICD-10-CM | POA: Diagnosis not present

## 2020-08-13 DIAGNOSIS — R1312 Dysphagia, oropharyngeal phase: Secondary | ICD-10-CM | POA: Diagnosis not present

## 2020-08-13 DIAGNOSIS — N184 Chronic kidney disease, stage 4 (severe): Secondary | ICD-10-CM | POA: Diagnosis not present

## 2020-08-13 DIAGNOSIS — Z9181 History of falling: Secondary | ICD-10-CM | POA: Diagnosis not present

## 2020-08-13 DIAGNOSIS — D696 Thrombocytopenia, unspecified: Secondary | ICD-10-CM | POA: Diagnosis not present

## 2020-08-13 DIAGNOSIS — K7460 Unspecified cirrhosis of liver: Secondary | ICD-10-CM | POA: Diagnosis not present

## 2020-08-13 DIAGNOSIS — E1169 Type 2 diabetes mellitus with other specified complication: Secondary | ICD-10-CM | POA: Diagnosis not present

## 2020-08-13 DIAGNOSIS — Z801 Family history of malignant neoplasm of trachea, bronchus and lung: Secondary | ICD-10-CM | POA: Diagnosis not present

## 2020-08-13 DIAGNOSIS — Z471 Aftercare following joint replacement surgery: Secondary | ICD-10-CM | POA: Diagnosis not present

## 2020-08-13 DIAGNOSIS — Z4789 Encounter for other orthopedic aftercare: Secondary | ICD-10-CM | POA: Diagnosis not present

## 2020-08-13 DIAGNOSIS — D500 Iron deficiency anemia secondary to blood loss (chronic): Secondary | ICD-10-CM | POA: Diagnosis not present

## 2020-08-13 DIAGNOSIS — R079 Chest pain, unspecified: Secondary | ICD-10-CM | POA: Diagnosis not present

## 2020-08-13 DIAGNOSIS — Z8249 Family history of ischemic heart disease and other diseases of the circulatory system: Secondary | ICD-10-CM | POA: Diagnosis not present

## 2020-08-13 DIAGNOSIS — Z20822 Contact with and (suspected) exposure to covid-19: Secondary | ICD-10-CM | POA: Diagnosis not present

## 2020-08-13 DIAGNOSIS — G9341 Metabolic encephalopathy: Secondary | ICD-10-CM | POA: Diagnosis not present

## 2020-08-13 DIAGNOSIS — M6281 Muscle weakness (generalized): Secondary | ICD-10-CM | POA: Diagnosis not present

## 2020-08-13 DIAGNOSIS — N179 Acute kidney failure, unspecified: Secondary | ICD-10-CM | POA: Diagnosis not present

## 2020-08-13 DIAGNOSIS — R58 Hemorrhage, not elsewhere classified: Secondary | ICD-10-CM | POA: Diagnosis not present

## 2020-08-13 DIAGNOSIS — M25552 Pain in left hip: Secondary | ICD-10-CM | POA: Diagnosis not present

## 2020-08-16 ENCOUNTER — Encounter
Admit: 2020-08-16 | Discharge: 2020-08-16 | Payer: PRIVATE HEALTH INSURANCE | Attending: Internal Medicine | Primary: Internal Medicine

## 2020-08-16 DIAGNOSIS — E611 Iron deficiency: Principal | ICD-10-CM

## 2020-08-16 DIAGNOSIS — D649 Anemia, unspecified: Principal | ICD-10-CM

## 2020-08-16 DIAGNOSIS — K7460 Unspecified cirrhosis of liver: Principal | ICD-10-CM

## 2020-08-16 LAB — COMPREHENSIVE METABOLIC PANEL
ALBUMIN: 2.3 g/dL — ABNORMAL LOW (ref 3.5–5.0)
ALKALINE PHOSPHATASE: 112 U/L (ref 46–116)
ALT (SGPT): 20 U/L (ref 12–78)
ANION GAP: 8 mmol/L (ref 3–11)
AST (SGOT): 38 U/L (ref 15–40)
BILIRUBIN TOTAL: 1.9 mg/dL — ABNORMAL HIGH (ref 0.3–1.2)
BLOOD UREA NITROGEN: 40 mg/dL — ABNORMAL HIGH (ref 8–20)
BUN / CREAT RATIO: 28
CALCIUM: 9.3 mg/dL (ref 8.5–10.1)
CHLORIDE: 109 mmol/L — ABNORMAL HIGH (ref 98–107)
CO2: 26.3 mmol/L (ref 21.0–32.0)
CREATININE: 1.44 mg/dL — ABNORMAL HIGH (ref 0.60–1.10)
EGFR CKD-EPI AA FEMALE: 44 mL/min/{1.73_m2}
EGFR CKD-EPI NON-AA FEMALE: 38 mL/min/{1.73_m2}
GLUCOSE RANDOM: 108 mg/dL (ref 70–179)
POTASSIUM: 3.8 mmol/L (ref 3.5–5.0)
PROTEIN TOTAL: 4.6 g/dL — ABNORMAL LOW (ref 6.0–8.0)
SODIUM: 143 mmol/L (ref 135–145)

## 2020-08-16 LAB — CBC
HEMATOCRIT: 22.9 % — CL (ref 34.0–44.0)
HEMOGLOBIN: 7.3 g/dL — CL (ref 11.5–15.0)
MEAN CORPUSCULAR HEMOGLOBIN CONC: 31.9 g/dL — ABNORMAL LOW (ref 32.0–36.0)
MEAN CORPUSCULAR HEMOGLOBIN: 30.9 pg (ref 27.0–34.0)
MEAN CORPUSCULAR VOLUME: 97 fL (ref 80.0–98.0)
MEAN PLATELET VOLUME: 12 fL — ABNORMAL HIGH (ref 7.4–10.4)
PLATELET COUNT: 36 10*9/L — CL (ref 140–415)
RED BLOOD CELL COUNT: 2.36 10*12/L — ABNORMAL LOW (ref 3.80–5.10)
RED CELL DISTRIBUTION WIDTH: 24 % — ABNORMAL HIGH (ref 11.5–14.5)
WBC ADJUSTED: 2.5 10*9/L — ABNORMAL LOW (ref 4.0–10.5)

## 2020-08-16 LAB — SLIDE REVIEW

## 2020-08-16 LAB — AMMONIA: AMMONIA: 45 umol/L — ABNORMAL HIGH (ref 11–32)

## 2020-08-17 DIAGNOSIS — Z96642 Presence of left artificial hip joint: Secondary | ICD-10-CM | POA: Diagnosis not present

## 2020-08-17 DIAGNOSIS — Z4789 Encounter for other orthopedic aftercare: Secondary | ICD-10-CM | POA: Diagnosis not present

## 2020-08-17 DIAGNOSIS — K7210 Chronic hepatic failure without coma: Secondary | ICD-10-CM | POA: Diagnosis not present

## 2020-08-17 DIAGNOSIS — I10 Essential (primary) hypertension: Secondary | ICD-10-CM | POA: Diagnosis not present

## 2020-08-17 DIAGNOSIS — Z299 Encounter for prophylactic measures, unspecified: Secondary | ICD-10-CM | POA: Diagnosis not present

## 2020-08-17 DIAGNOSIS — Z9181 History of falling: Secondary | ICD-10-CM | POA: Diagnosis not present

## 2020-08-17 DIAGNOSIS — N184 Chronic kidney disease, stage 4 (severe): Secondary | ICD-10-CM | POA: Diagnosis not present

## 2020-08-17 DIAGNOSIS — D696 Thrombocytopenia, unspecified: Secondary | ICD-10-CM | POA: Diagnosis not present

## 2020-08-17 DIAGNOSIS — K7290 Hepatic failure, unspecified without coma: Secondary | ICD-10-CM | POA: Diagnosis not present

## 2020-08-17 DIAGNOSIS — R079 Chest pain, unspecified: Secondary | ICD-10-CM | POA: Diagnosis not present

## 2020-08-19 ENCOUNTER — Ambulatory Visit (INDEPENDENT_AMBULATORY_CARE_PROVIDER_SITE_OTHER): Payer: PPO | Admitting: Gastroenterology

## 2020-08-19 ENCOUNTER — Telehealth (INDEPENDENT_AMBULATORY_CARE_PROVIDER_SITE_OTHER): Payer: Self-pay

## 2020-08-19 DIAGNOSIS — K7290 Hepatic failure, unspecified without coma: Secondary | ICD-10-CM | POA: Diagnosis not present

## 2020-08-19 DIAGNOSIS — K7460 Unspecified cirrhosis of liver: Secondary | ICD-10-CM | POA: Diagnosis not present

## 2020-08-19 DIAGNOSIS — N184 Chronic kidney disease, stage 4 (severe): Secondary | ICD-10-CM | POA: Diagnosis not present

## 2020-08-19 DIAGNOSIS — Z299 Encounter for prophylactic measures, unspecified: Secondary | ICD-10-CM | POA: Diagnosis not present

## 2020-08-19 DIAGNOSIS — I8500 Esophageal varices without bleeding: Secondary | ICD-10-CM | POA: Diagnosis not present

## 2020-08-19 NOTE — Telephone Encounter (Signed)
Patient no showed for her appt today 08/19/2020 with Dr. Jenetta Downer.

## 2020-08-19 NOTE — Telephone Encounter (Signed)
Noted  

## 2020-08-20 ENCOUNTER — Encounter
Admit: 2020-08-20 | Discharge: 2020-08-20 | Payer: PRIVATE HEALTH INSURANCE | Attending: Internal Medicine | Primary: Internal Medicine

## 2020-08-20 DIAGNOSIS — S72002D Fracture of unspecified part of neck of left femur, subsequent encounter for closed fracture with routine healing: Principal | ICD-10-CM

## 2020-08-20 DIAGNOSIS — Z9181 History of falling: Principal | ICD-10-CM

## 2020-08-20 DIAGNOSIS — Z471 Aftercare following joint replacement surgery: Principal | ICD-10-CM

## 2020-08-20 DIAGNOSIS — K7581 Nonalcoholic steatohepatitis (NASH): Principal | ICD-10-CM

## 2020-08-20 LAB — CBC W/ AUTO DIFF
BASOPHILS ABSOLUTE COUNT: 0 10*9/L (ref 0.0–0.2)
BASOPHILS RELATIVE PERCENT: 0.8 %
EOSINOPHILS ABSOLUTE COUNT: 0.1 10*9/L (ref 0.0–0.4)
EOSINOPHILS RELATIVE PERCENT: 2.8 %
HEMATOCRIT: 23.6 % — CL (ref 34.0–44.0)
HEMOGLOBIN: 7.4 g/dL — CL (ref 11.5–15.0)
LYMPHOCYTES ABSOLUTE COUNT: 0.9 10*9/L (ref 0.7–4.5)
LYMPHOCYTES RELATIVE PERCENT: 23.4 %
MEAN CORPUSCULAR HEMOGLOBIN CONC: 31.4 g/dL — ABNORMAL LOW (ref 32.0–36.0)
MEAN CORPUSCULAR HEMOGLOBIN: 29.2 pg (ref 27.0–34.0)
MEAN CORPUSCULAR VOLUME: 93.3 fL (ref 80.0–98.0)
MONOCYTES ABSOLUTE COUNT: 0.7 10*9/L (ref 0.1–1.0)
MONOCYTES RELATIVE PERCENT: 17.3 %
NEUTROPHILS ABSOLUTE COUNT: 2.2 10*9/L (ref 1.8–7.8)
NEUTROPHILS RELATIVE PERCENT: 55.2 %
PLATELET COUNT: 40 10*9/L — ABNORMAL LOW (ref 140–415)
RED BLOOD CELL COUNT: 2.53 10*12/L — ABNORMAL LOW (ref 3.80–5.10)
RED CELL DISTRIBUTION WIDTH: 21.7 % — ABNORMAL HIGH (ref 11.5–14.5)
WBC ADJUSTED: 3.9 10*9/L — ABNORMAL LOW (ref 4.0–10.5)

## 2020-08-20 LAB — AMMONIA: AMMONIA: 87 umol/L — ABNORMAL HIGH (ref 11–32)

## 2020-08-20 LAB — SLIDE REVIEW

## 2020-08-24 DIAGNOSIS — K7290 Hepatic failure, unspecified without coma: Secondary | ICD-10-CM | POA: Diagnosis not present

## 2020-08-24 DIAGNOSIS — Z299 Encounter for prophylactic measures, unspecified: Secondary | ICD-10-CM | POA: Diagnosis not present

## 2020-08-24 DIAGNOSIS — N184 Chronic kidney disease, stage 4 (severe): Secondary | ICD-10-CM | POA: Diagnosis not present

## 2020-08-24 DIAGNOSIS — K7210 Chronic hepatic failure without coma: Secondary | ICD-10-CM | POA: Diagnosis not present

## 2020-08-24 DIAGNOSIS — D696 Thrombocytopenia, unspecified: Secondary | ICD-10-CM | POA: Diagnosis not present

## 2020-08-25 ENCOUNTER — Encounter: Admit: 2020-08-25 | Payer: PRIVATE HEALTH INSURANCE | Attending: Internal Medicine | Primary: Internal Medicine

## 2020-08-25 DIAGNOSIS — Z79811 Long term (current) use of aromatase inhibitors: Principal | ICD-10-CM

## 2020-08-25 LAB — COMPREHENSIVE METABOLIC PANEL
ALBUMIN: 2.8 g/dL — ABNORMAL LOW (ref 3.5–5.0)
ALKALINE PHOSPHATASE: 151 U/L — ABNORMAL HIGH (ref 46–116)
ALT (SGPT): 33 U/L (ref 12–78)
ANION GAP: 11 mmol/L (ref 3–11)
AST (SGOT): 34 U/L (ref 15–40)
BILIRUBIN TOTAL: 4.4 mg/dL — ABNORMAL HIGH (ref 0.3–1.2)
BLOOD UREA NITROGEN: 68 mg/dL — ABNORMAL HIGH (ref 8–20)
BUN / CREAT RATIO: 25
CALCIUM: 10.3 mg/dL — ABNORMAL HIGH (ref 8.5–10.1)
CHLORIDE: 95 mmol/L — ABNORMAL LOW (ref 98–107)
CO2: 27.7 mmol/L (ref 21.0–32.0)
CREATININE: 2.77 mg/dL — ABNORMAL HIGH (ref 0.60–1.10)
EGFR CKD-EPI AA FEMALE: 20 mL/min/{1.73_m2}
EGFR CKD-EPI NON-AA FEMALE: 17 mL/min/{1.73_m2}
GLUCOSE RANDOM: 290 mg/dL — ABNORMAL HIGH (ref 70–179)
POTASSIUM: 3.5 mmol/L (ref 3.5–5.0)
PROTEIN TOTAL: 6.4 g/dL (ref 6.0–8.0)
SODIUM: 134 mmol/L — ABNORMAL LOW (ref 135–145)

## 2020-08-25 LAB — CBC
HEMATOCRIT: 28.7 % — ABNORMAL LOW (ref 34.0–44.0)
HEMOGLOBIN: 9.3 g/dL — ABNORMAL LOW (ref 11.5–15.0)
MEAN CORPUSCULAR HEMOGLOBIN CONC: 32.4 g/dL (ref 32.0–36.0)
MEAN CORPUSCULAR HEMOGLOBIN: 30.1 pg (ref 27.0–34.0)
MEAN CORPUSCULAR VOLUME: 92.9 fL (ref 80.0–98.0)
MEAN PLATELET VOLUME: 10.9 fL — ABNORMAL HIGH (ref 7.4–10.4)
PLATELET COUNT: 55 10*9/L — ABNORMAL LOW (ref 140–415)
RED BLOOD CELL COUNT: 3.09 10*12/L — ABNORMAL LOW (ref 3.80–5.10)
RED CELL DISTRIBUTION WIDTH: 22.6 % — ABNORMAL HIGH (ref 11.5–14.5)
WBC ADJUSTED: 7.8 10*9/L (ref 4.0–10.5)

## 2020-08-25 LAB — AMMONIA: AMMONIA: 55 umol/L — ABNORMAL HIGH (ref 11–32)

## 2020-08-26 NOTE — Unmapped (Signed)
Call placed and spoke with pts husband Marilyn French) who is concerned about pt having a GI procedure at [facility]. Husband reports she is presently recovering from hip fracture at a rehab center. He is confused why his wife needs to be scoped if her labs show that she is not bleeding and concerns about their ability to manage her complicated care.  Encouraged Marilyn French to discuss his concerns and  POC with primary team currently caring for his wife.  Michael in agreement with plan, appreciative of time taken to listen to his concerns

## 2020-08-28 DIAGNOSIS — R410 Disorientation, unspecified: Principal | ICD-10-CM

## 2020-08-28 LAB — COMPREHENSIVE METABOLIC PANEL
ALBUMIN: 2.9 g/dL — ABNORMAL LOW (ref 3.5–5.0)
ALKALINE PHOSPHATASE: 158 U/L — ABNORMAL HIGH (ref 46–116)
ALT (SGPT): 38 U/L (ref 12–78)
ANION GAP: 9 mmol/L (ref 3–11)
AST (SGOT): 41 U/L — ABNORMAL HIGH (ref 15–40)
BILIRUBIN TOTAL: 4.5 mg/dL — ABNORMAL HIGH (ref 0.3–1.2)
BLOOD UREA NITROGEN: 82 mg/dL — ABNORMAL HIGH (ref 8–20)
BUN / CREAT RATIO: 28
CALCIUM: 9.5 mg/dL (ref 8.5–10.1)
CHLORIDE: 94 mmol/L — ABNORMAL LOW (ref 98–107)
CO2: 30 mmol/L (ref 21.0–32.0)
CREATININE: 2.95 mg/dL — ABNORMAL HIGH (ref 0.60–1.10)
EGFR CKD-EPI AA FEMALE: 18 mL/min/{1.73_m2}
EGFR CKD-EPI NON-AA FEMALE: 16 mL/min/{1.73_m2}
GLUCOSE RANDOM: 342 mg/dL — ABNORMAL HIGH (ref 70–179)
POTASSIUM: 3.6 mmol/L (ref 3.5–5.0)
PROTEIN TOTAL: 6.6 g/dL (ref 6.0–8.0)
SODIUM: 133 mmol/L — ABNORMAL LOW (ref 135–145)

## 2020-08-28 LAB — CBC
HEMATOCRIT: 30.2 % — ABNORMAL LOW (ref 34.0–44.0)
HEMOGLOBIN: 9.9 g/dL — ABNORMAL LOW (ref 11.5–15.0)
MEAN CORPUSCULAR HEMOGLOBIN CONC: 32.8 g/dL (ref 32.0–36.0)
MEAN CORPUSCULAR HEMOGLOBIN: 29.8 pg (ref 27.0–34.0)
MEAN CORPUSCULAR VOLUME: 91 fL (ref 80.0–98.0)
MEAN PLATELET VOLUME: 11.8 fL — ABNORMAL HIGH (ref 7.4–10.4)
PLATELET COUNT: 77 10*9/L — ABNORMAL LOW (ref 140–415)
RED BLOOD CELL COUNT: 3.32 10*12/L — ABNORMAL LOW (ref 3.80–5.10)
RED CELL DISTRIBUTION WIDTH: 22.3 % — ABNORMAL HIGH (ref 11.5–14.5)
WBC ADJUSTED: 8.1 10*9/L (ref 4.0–10.5)

## 2020-08-28 LAB — AMMONIA: AMMONIA: 104 umol/L — ABNORMAL HIGH (ref 11–32)

## 2020-08-28 LAB — SLIDE REVIEW

## 2020-08-29 ENCOUNTER — Inpatient Hospital Stay (HOSPITAL_COMMUNITY)
Admission: EM | Admit: 2020-08-29 | Discharge: 2020-08-31 | DRG: 682 | Disposition: A | Discharge status: Home Health | Payer: PPO | Source: Skilled Nursing Facility | Attending: Family Medicine | Admitting: Family Medicine

## 2020-08-29 ENCOUNTER — Observation Stay (HOSPITAL_COMMUNITY): Payer: PPO

## 2020-08-29 ENCOUNTER — Encounter (HOSPITAL_COMMUNITY): Payer: Self-pay

## 2020-08-29 ENCOUNTER — Emergency Department (HOSPITAL_COMMUNITY): Payer: PPO

## 2020-08-29 ENCOUNTER — Other Ambulatory Visit: Payer: Self-pay

## 2020-08-29 DIAGNOSIS — K7581 Nonalcoholic steatohepatitis (NASH): Principal | ICD-10-CM

## 2020-08-29 DIAGNOSIS — S72002D Fracture of unspecified part of neck of left femur, subsequent encounter for closed fracture with routine healing: Principal | ICD-10-CM

## 2020-08-29 DIAGNOSIS — Z9181 History of falling: Principal | ICD-10-CM

## 2020-08-29 DIAGNOSIS — Z96642 Presence of left artificial hip joint: Secondary | ICD-10-CM | POA: Diagnosis present

## 2020-08-29 DIAGNOSIS — E860 Dehydration: Secondary | ICD-10-CM | POA: Diagnosis not present

## 2020-08-29 DIAGNOSIS — Z794 Long term (current) use of insulin: Secondary | ICD-10-CM | POA: Diagnosis not present

## 2020-08-29 DIAGNOSIS — R404 Transient alteration of awareness: Secondary | ICD-10-CM | POA: Diagnosis not present

## 2020-08-29 DIAGNOSIS — Z20822 Contact with and (suspected) exposure to covid-19: Secondary | ICD-10-CM | POA: Diagnosis not present

## 2020-08-29 DIAGNOSIS — Z800 Family history of malignant neoplasm of digestive organs: Secondary | ICD-10-CM

## 2020-08-29 DIAGNOSIS — K7460 Unspecified cirrhosis of liver: Secondary | ICD-10-CM | POA: Diagnosis present

## 2020-08-29 DIAGNOSIS — E039 Hypothyroidism, unspecified: Secondary | ICD-10-CM | POA: Diagnosis present

## 2020-08-29 DIAGNOSIS — Z79899 Other long term (current) drug therapy: Secondary | ICD-10-CM | POA: Diagnosis not present

## 2020-08-29 DIAGNOSIS — Z79890 Hormone replacement therapy (postmenopausal): Secondary | ICD-10-CM | POA: Diagnosis not present

## 2020-08-29 DIAGNOSIS — I5032 Chronic diastolic (congestive) heart failure: Secondary | ICD-10-CM | POA: Diagnosis present

## 2020-08-29 DIAGNOSIS — D509 Iron deficiency anemia, unspecified: Secondary | ICD-10-CM | POA: Diagnosis present

## 2020-08-29 DIAGNOSIS — Z471 Aftercare following joint replacement surgery: Secondary | ICD-10-CM | POA: Diagnosis not present

## 2020-08-29 DIAGNOSIS — Z8249 Family history of ischemic heart disease and other diseases of the circulatory system: Secondary | ICD-10-CM | POA: Diagnosis not present

## 2020-08-29 DIAGNOSIS — K31819 Angiodysplasia of stomach and duodenum without bleeding: Secondary | ICD-10-CM | POA: Diagnosis not present

## 2020-08-29 DIAGNOSIS — I2510 Atherosclerotic heart disease of native coronary artery without angina pectoris: Secondary | ICD-10-CM | POA: Diagnosis not present

## 2020-08-29 DIAGNOSIS — G9341 Metabolic encephalopathy: Secondary | ICD-10-CM | POA: Diagnosis not present

## 2020-08-29 DIAGNOSIS — D696 Thrombocytopenia, unspecified: Secondary | ICD-10-CM | POA: Diagnosis present

## 2020-08-29 DIAGNOSIS — E1165 Type 2 diabetes mellitus with hyperglycemia: Secondary | ICD-10-CM | POA: Diagnosis not present

## 2020-08-29 DIAGNOSIS — I272 Pulmonary hypertension, unspecified: Secondary | ICD-10-CM | POA: Diagnosis present

## 2020-08-29 DIAGNOSIS — R4182 Altered mental status, unspecified: Secondary | ICD-10-CM | POA: Diagnosis not present

## 2020-08-29 DIAGNOSIS — E119 Type 2 diabetes mellitus without complications: Secondary | ICD-10-CM

## 2020-08-29 DIAGNOSIS — I959 Hypotension, unspecified: Secondary | ICD-10-CM | POA: Diagnosis not present

## 2020-08-29 DIAGNOSIS — D500 Iron deficiency anemia secondary to blood loss (chronic): Secondary | ICD-10-CM | POA: Diagnosis not present

## 2020-08-29 DIAGNOSIS — Z8719 Personal history of other diseases of the digestive system: Secondary | ICD-10-CM

## 2020-08-29 DIAGNOSIS — I110 Hypertensive heart disease with heart failure: Secondary | ICD-10-CM | POA: Diagnosis present

## 2020-08-29 DIAGNOSIS — M25552 Pain in left hip: Secondary | ICD-10-CM | POA: Diagnosis not present

## 2020-08-29 DIAGNOSIS — E1169 Type 2 diabetes mellitus with other specified complication: Secondary | ICD-10-CM | POA: Diagnosis not present

## 2020-08-29 DIAGNOSIS — Z801 Family history of malignant neoplasm of trachea, bronchus and lung: Secondary | ICD-10-CM

## 2020-08-29 DIAGNOSIS — N179 Acute kidney failure, unspecified: Principal | ICD-10-CM | POA: Diagnosis present

## 2020-08-29 DIAGNOSIS — Z833 Family history of diabetes mellitus: Secondary | ICD-10-CM | POA: Diagnosis not present

## 2020-08-29 LAB — CBC
HEMATOCRIT: 30.9 % — ABNORMAL LOW (ref 34.0–44.0)
HEMOGLOBIN: 10 g/dL — ABNORMAL LOW (ref 11.5–15.0)
MEAN CORPUSCULAR HEMOGLOBIN CONC: 32.4 g/dL (ref 32.0–36.0)
MEAN CORPUSCULAR HEMOGLOBIN: 29.9 pg (ref 27.0–34.0)
MEAN CORPUSCULAR VOLUME: 92.5 fL (ref 80.0–98.0)
PLATELET COUNT: 65 10*9/L — ABNORMAL LOW (ref 140–415)
RED BLOOD CELL COUNT: 3.34 10*12/L — ABNORMAL LOW (ref 3.80–5.10)
RED CELL DISTRIBUTION WIDTH: 22.2 % — ABNORMAL HIGH (ref 11.5–14.5)
WBC ADJUSTED: 8.2 10*9/L (ref 4.0–10.5)

## 2020-08-29 LAB — BASIC METABOLIC PANEL
ANION GAP: 11 mmol/L (ref 3–11)
BLOOD UREA NITROGEN: 80 mg/dL — ABNORMAL HIGH (ref 8–20)
BUN / CREAT RATIO: 29
CALCIUM: 9.4 mg/dL (ref 8.5–10.1)
CHLORIDE: 97 mmol/L — ABNORMAL LOW (ref 98–107)
CO2: 26.2 mmol/L (ref 21.0–32.0)
CREATININE: 2.76 mg/dL — ABNORMAL HIGH (ref 0.60–1.10)
EGFR CKD-EPI AA FEMALE: 20 mL/min/{1.73_m2}
EGFR CKD-EPI NON-AA FEMALE: 17 mL/min/{1.73_m2}
GLUCOSE RANDOM: 310 mg/dL — ABNORMAL HIGH (ref 70–179)
POTASSIUM: 4.1 mmol/L (ref 3.5–5.0)
SODIUM: 134 mmol/L — ABNORMAL LOW (ref 135–145)

## 2020-08-29 LAB — SLIDE REVIEW

## 2020-08-29 LAB — AMMONIA
AMMONIA: 93 umol/L — ABNORMAL HIGH (ref 11–32)
Ammonia: 49 umol/L — ABNORMAL HIGH (ref 9–35)

## 2020-08-29 LAB — CBC WITH DIFFERENTIAL/PLATELET
Abs Immature Granulocytes: 0.02 10*3/uL (ref 0.00–0.07)
Basophils Absolute: 0 10*3/uL (ref 0.0–0.1)
Basophils Relative: 1 %
Eosinophils Absolute: 0.1 10*3/uL (ref 0.0–0.5)
Eosinophils Relative: 1 %
HCT: 30.8 % — ABNORMAL LOW (ref 36.0–46.0)
Hemoglobin: 9.8 g/dL — ABNORMAL LOW (ref 12.0–15.0)
Immature Granulocytes: 0 %
Lymphocytes Relative: 16 %
Lymphs Abs: 1.1 10*3/uL (ref 0.7–4.0)
MCH: 30.8 pg (ref 26.0–34.0)
MCHC: 31.8 g/dL (ref 30.0–36.0)
MCV: 96.9 fL (ref 80.0–100.0)
Monocytes Absolute: 1.1 10*3/uL — ABNORMAL HIGH (ref 0.1–1.0)
Monocytes Relative: 16 %
Neutro Abs: 4.6 10*3/uL (ref 1.7–7.7)
Neutrophils Relative %: 66 %
Platelets: 66 10*3/uL — ABNORMAL LOW (ref 150–400)
RBC: 3.18 MIL/uL — ABNORMAL LOW (ref 3.87–5.11)
RDW: 22.5 % — ABNORMAL HIGH (ref 11.5–15.5)
WBC: 6.9 10*3/uL (ref 4.0–10.5)
nRBC: 0 % (ref 0.0–0.2)

## 2020-08-29 LAB — URINALYSIS, ROUTINE W REFLEX MICROSCOPIC
Bilirubin Urine: NEGATIVE
Glucose, UA: 50 mg/dL — AB
Hgb urine dipstick: NEGATIVE
Ketones, ur: NEGATIVE mg/dL
Leukocytes,Ua: NEGATIVE
Nitrite: NEGATIVE
Protein, ur: NEGATIVE mg/dL
Specific Gravity, Urine: 1.014 (ref 1.005–1.030)
pH: 6 (ref 5.0–8.0)

## 2020-08-29 LAB — COMPREHENSIVE METABOLIC PANEL
ALT: 29 U/L (ref 0–44)
AST: 34 U/L (ref 15–41)
Albumin: 2.8 g/dL — ABNORMAL LOW (ref 3.5–5.0)
Alkaline Phosphatase: 119 U/L (ref 38–126)
Anion gap: 11 (ref 5–15)
BUN: 76 mg/dL — ABNORMAL HIGH (ref 8–23)
CO2: 24 mmol/L (ref 22–32)
Calcium: 8.9 mg/dL (ref 8.9–10.3)
Chloride: 95 mmol/L — ABNORMAL LOW (ref 98–111)
Creatinine, Ser: 2.22 mg/dL — ABNORMAL HIGH (ref 0.44–1.00)
GFR, Estimated: 24 mL/min — ABNORMAL LOW (ref >60)
Glucose, Bld: 409 mg/dL — ABNORMAL HIGH (ref 70–99)
Potassium: 4.2 mmol/L (ref 3.5–5.1)
Sodium: 130 mmol/L — ABNORMAL LOW (ref 135–145)
Total Bilirubin: 4.8 mg/dL — ABNORMAL HIGH (ref 0.3–1.2)
Total Protein: 5.9 g/dL — ABNORMAL LOW (ref 6.5–8.1)

## 2020-08-29 LAB — LIPASE, BLOOD: Lipase: 45 U/L (ref 11–51)

## 2020-08-29 LAB — CBG MONITORING, ED
Glucose-Capillary: 337 mg/dL — ABNORMAL HIGH (ref 70–99)
Glucose-Capillary: 265 mg/dL — ABNORMAL HIGH (ref 70–99)

## 2020-08-29 LAB — RESP PANEL BY RT-PCR (FLU A&B, COVID) ARPGX2
Influenza A by PCR: NEGATIVE
Influenza B by PCR: NEGATIVE
SARS Coronavirus 2 by RT PCR: NEGATIVE

## 2020-08-29 MED ORDER — SODIUM CHLORIDE 0.9 % IV BOLUS
1000.0000 mL | Freq: Once | INTRAVENOUS | Status: AC
  Administered 2020-08-29: 13:00:00 1000 mL via INTRAVENOUS

## 2020-08-29 MED ORDER — INSULIN ASPART 100 UNIT/ML ~~LOC~~ SOLN
0.0000 [IU] | Freq: Three times a day (TID) | SUBCUTANEOUS | Status: DC
  Administered 2020-08-30: 12:00:00 5 [IU] via SUBCUTANEOUS
  Administered 2020-08-30 – 2020-08-31 (×3): 3 [IU] via SUBCUTANEOUS
  Administered 2020-08-31: 08:00:00 1 [IU] via SUBCUTANEOUS

## 2020-08-29 MED ORDER — INSULIN ASPART 100 UNIT/ML ~~LOC~~ SOLN
0.0000 [IU] | Freq: Every day | SUBCUTANEOUS | Status: DC
  Administered 2020-08-30: 23:00:00 2 [IU] via SUBCUTANEOUS
  Administered 2020-08-30: 02:00:00 3 [IU] via SUBCUTANEOUS

## 2020-08-29 MED ORDER — LEVOTHYROXINE SODIUM 25 MCG PO TABS
25.0000 ug | ORAL_TABLET | Freq: Every day | ORAL | Status: DC
  Administered 2020-08-30 – 2020-08-31 (×2): 25 ug via ORAL
  Filled 2020-08-29 (×2): qty 1

## 2020-08-29 MED ORDER — LACTULOSE 10 GM/15ML PO SOLN
10.0000 g | Freq: Once | ORAL | Status: AC
  Administered 2020-08-29: 14:00:00 10 g via ORAL
  Filled 2020-08-29: qty 30

## 2020-08-29 MED ORDER — RIFAXIMIN 550 MG PO TABS
550.0000 mg | ORAL_TABLET | Freq: Two times a day (BID) | ORAL | Status: DC
  Administered 2020-08-29 – 2020-08-31 (×4): 550 mg via ORAL
  Filled 2020-08-29 (×4): qty 1

## 2020-08-29 MED ORDER — ACETAMINOPHEN 650 MG RE SUPP: 650.0000 mg | Freq: Three times a day (TID) | RECTAL | Status: DC | PRN

## 2020-08-29 MED ORDER — INSULIN ASPART 100 UNIT/ML ~~LOC~~ SOLN: 0.0000 [IU] | Freq: Three times a day (TID) | SUBCUTANEOUS | Status: DC

## 2020-08-29 MED ORDER — ACETAMINOPHEN 325 MG PO TABS
650.0000 mg | ORAL_TABLET | Freq: Three times a day (TID) | ORAL | Status: DC | PRN
  Administered 2020-08-30: 650 mg via ORAL
  Filled 2020-08-29: qty 2

## 2020-08-29 MED ORDER — FERROUS SULFATE 325 (65 FE) MG PO TABS
325.0000 mg | ORAL_TABLET | Freq: Every day | ORAL | Status: DC
  Administered 2020-08-30 – 2020-08-31 (×2): 325 mg via ORAL
  Filled 2020-08-29 (×2): qty 1

## 2020-08-29 MED ORDER — ONDANSETRON HCL 4 MG PO TABS: 4.0000 mg | ORAL_TABLET | Freq: Four times a day (QID) | ORAL | Status: DC | PRN

## 2020-08-29 MED ORDER — ROSUVASTATIN CALCIUM 10 MG PO TABS
10.0000 mg | ORAL_TABLET | Freq: Every day | ORAL | Status: DC
  Administered 2020-08-30: 18:00:00 10 mg via ORAL
  Filled 2020-08-29: qty 1

## 2020-08-29 MED ORDER — INSULIN GLARGINE 100 UNIT/ML ~~LOC~~ SOLN
50.0000 [IU] | Freq: Every day | SUBCUTANEOUS | Status: DC
  Administered 2020-08-30 – 2020-08-31 (×2): 50 [IU] via SUBCUTANEOUS
  Filled 2020-08-29 (×3): qty 0.5

## 2020-08-29 MED ORDER — LACTULOSE 10 GM/15ML PO SOLN
40.0000 g | Freq: Three times a day (TID) | ORAL | Status: DC
  Administered 2020-08-29 – 2020-08-31 (×6): 40 g via ORAL
  Filled 2020-08-29 (×6): qty 60

## 2020-08-29 MED ORDER — ONDANSETRON HCL 4 MG/2ML IJ SOLN: 4.0000 mg | Freq: Four times a day (QID) | INTRAMUSCULAR | Status: DC | PRN

## 2020-08-29 MED ORDER — INSULIN ASPART 100 UNIT/ML ~~LOC~~ SOLN
10.0000 [IU] | Freq: Once | SUBCUTANEOUS | Status: AC
  Administered 2020-08-29: 14:00:00 10 [IU] via SUBCUTANEOUS
  Filled 2020-08-29: qty 1

## 2020-08-29 MED ORDER — SERTRALINE HCL 50 MG PO TABS
50.0000 mg | ORAL_TABLET | Freq: Every day | ORAL | Status: DC
  Administered 2020-08-30 – 2020-08-31 (×2): 50 mg via ORAL
  Filled 2020-08-29 (×2): qty 1

## 2020-08-29 MED ORDER — BUSPIRONE HCL 5 MG PO TABS
10.0000 mg | ORAL_TABLET | Freq: Two times a day (BID) | ORAL | Status: DC
  Administered 2020-08-29 – 2020-08-31 (×4): 10 mg via ORAL
  Filled 2020-08-29 (×4): qty 2

## 2020-08-29 MED ORDER — SODIUM CHLORIDE 0.9 % IV SOLN: INTRAVENOUS | Status: AC

## 2020-08-29 NOTE — ED Provider Notes (Signed)
  Face-to-face evaluation   History: She presents for evaluation of confusion with suspected elevated ammonia level.  He thinks it was "100 yesterday."  Apparently she was treated with IV fluids and lactulose at her facility.  She has had a complicated recent course with hip fracture, subsequent fluid overload, intermittent periods of creatinine bump, as well as waxing and waning ammonia levels.  Husband is at her baseline optimal weight is 170 to 180 pounds.  If she gets fluid overloaded she tends to bleed from her varices.  Physical exam: Alert elderly female.  She appears older than stated age.  She is confused.  Oral mucous membranes are very dry.   Medical screening examination/treatment/procedure(s) were conducted as a shared visit with non-physician practitioner(s) and myself.  I personally evaluated the patient during the encounter    Daleen Bo, MD 08/29/20 1715

## 2020-08-29 NOTE — ED Provider Notes (Signed)
[facility] EMERGENCY DEPARTMENT Provider Note   CSN: [id number] Arrival date & time: 08/29/20  1243     History Chief Complaint  Patient presents with  . Altered Mental Status    Maria [name] is a 64 y.o. female.  The history is provided by the patient, the nursing home, the EMS personnel and medical records. No language interpreter was used.  Altered Mental Status    64 year old female significant history of NASH leading to liver cirrhosis, recurrent hepatic encephalopathy, CHF, anemia, diabetes, fibromyalgias brought here via EMS from [facility] rehab for evaluation of altered mental status.  Apart from history was obtained through nursing note as well as previous notes.  Patient suffered from a femur fracture approximately a month ago but was not a candidate for surgical intervention therefore she is currently at a rehab facility.  It was report that her ammonia level are high despite being given lactulose at the facility.  Patient brought here for further evaluation.  Due to patient's altered mental status, history is limited.  Patient unable to tell me what is currently going on.  She does not have any specific complaint.  When asked if she has any fever or chills headache lightheadedness dizziness chest pain trouble breathing abdominal pain nausea vomiting diarrhea fever patient denies.  Unsure of her Covid status.  Level 5 caveats.  Past Medical History:  Diagnosis Date  . Anemia   . CHF (congestive heart failure) ([place])   . Cirrhosis ([place])   . Depression   . Diabetes mellitus   . Esophageal bleed, non-variceal   . Eye hemorrhage    Behind left eye  . Fibromyalgia   . Hypercholesteremia   . Hypertension   . Hypothyroidism   . NAFLD (nonalcoholic fatty liver disease)   . Osteoarthrosis   . Osteoporosis   . PONV (postoperative nausea and vomiting)   . UTI (urinary tract infection) 11/12 end of the month   Patient feels that she passed a kidney stone at that  time    Patient Active Problem List   Diagnosis Date Noted  . Lower extremity edema 07/19/2020  . Acute anemia 09/29/2019  . Hepatic encephalopathy ([place]) 08/27/2019  . Altered mental status 02/27/2019  . Bacteriuria 02/27/2019  . Acute on chronic anemia 02/24/2019  . Acute hepatic encephalopathy 07/26/2018  . Obesity, Class III, BMI 40-49.9 (morbid obesity) ([place])   . Melena   . Anemia 07/02/2018  . UGI bleed 11/09/2017  . Insulin dependent diabetes mellitus   . Absolute anemia 09/13/2017  . Idiopathic esophageal varices with bleeding ([place]) 09/13/2017  . Other cirrhosis of liver ([place]) 08/22/2017  . NAFLD (nonalcoholic fatty liver disease) 08/14/2017  . Encephalopathy, hepatic ([place]) 08/14/2017  . Metabolic encephalopathy 65/11/5463  . Hyperammonemia ([place]) 07/18/2017  . Hypothyroidism 07/18/2017  . Acute on chronic right-sided congestive heart failure ([place]) 07/06/2017  . Pulmonary hypertension ([place]) 07/06/2017  . GAVE (gastric antral vascular ectasia) 07/06/2017  . GI bleeding 07/06/2017  . Symptomatic anemia 07/04/2017  . Hypokalemia 07/04/2017  . Chronic diastolic heart failure ([address]) 06/11/2017  . CAD (coronary artery disease) 06/11/2017  . Nocturnal hypoxemia 02/22/2017  . Idiopathic esophageal varices without bleeding ([place]) 01/31/2017  . Dyspnea and respiratory abnormalities 01/25/2017  . Esophageal varices in cirrhosis ([place]) 05/19/2016  . Hepatic cirrhosis ([place]) 05/19/2016  . Esophageal varices ([place]) 12/09/2012  . Dysphagia, unspecified(787.20) 12/09/2012  . Anxiety 08/26/2012  . Cirrhosis, nonalcoholic ([place]) 68/08/7516  . Diabetes mellitus ([place]) 10/09/2011  . History of  esophageal varices 10/09/2011  . Iron deficiency anemia 10/09/2011  . GERD (gastroesophageal reflux disease) 10/09/2011  . Thrombocytopenia ([place]) 10/09/2011    Past Surgical History:  Procedure Laterality Date  . APPENDECTOMY  1980  . BILATERAL SALPINGOOPHORECTOMY    . CHOLECYSTECTOMY    .  COLONOSCOPY  03/15/2011  . COLONOSCOPY N/A 03/24/2016   Procedure: COLONOSCOPY;  Surgeon: [name], MD;  [facility];  Service: Endoscopy;  Laterality: N/A;  855  . ESOPHAGEAL BANDING  04/24/2012   Procedure: ESOPHAGEAL BANDING;  Surgeon: [name], MD;  [facility];  Service: Endoscopy;  Laterality: N/A;  . Esophageal BANDING  08/03/2012   [facility] [place] , [place]  09/24/2012   Procedure: ESOPHAGEAL BANDING;  Surgeon: [name], MD;  Location: [facility];  Service: Endoscopy;  Laterality: N/A;  Banding x 3  . ESOPHAGEAL BANDING N/A 01/29/2013   Procedure: ESOPHAGEAL BANDING;  Surgeon: [name], MD;  [facility];  Service: Endoscopy;  Laterality: N/A;  . ESOPHAGEAL BANDING N/A 06/19/2014   Procedure: ESOPHAGEAL BANDING;  Surgeon: [name], MD;  [facility];  Service: Endoscopy;  Laterality: N/A;  . ESOPHAGEAL BANDING N/A 01/05/2016   Procedure: ESOPHAGEAL BANDING;  Surgeon: [name], MD;  [facility];  Service: Endoscopy;  Laterality: N/A;  . ESOPHAGEAL BANDING N/A 08/03/2016   Procedure: ESOPHAGEAL BANDING;  Surgeon: [name], MD;  [facility];  Service: Endoscopy;  Laterality: N/A;  . ESOPHAGEAL BANDING N/A 05/02/2017   Procedure: ESOPHAGEAL BANDING;  Surgeon: [name], MD;  [facility];  Service: Endoscopy;  Laterality: N/A;  . ESOPHAGOGASTRODUODENOSCOPY  04/24/2012   Procedure: ESOPHAGOGASTRODUODENOSCOPY (EGD);  Surgeon: [name], MD;  [facility];  Service: Endoscopy;  Laterality: N/A;  300  . ESOPHAGOGASTRODUODENOSCOPY N/A 01/29/2013   Procedure: ESOPHAGOGASTRODUODENOSCOPY (EGD);  Surgeon: [name], MD;  [facility];  Service: Endoscopy;  Laterality: N/A;  1200  . ESOPHAGOGASTRODUODENOSCOPY N/A 05/22/2013   Procedure: ESOPHAGOGASTRODUODENOSCOPY (EGD);  Surgeon: [name], MD;  [facility]  [facility];  Service: Endoscopy;  Laterality: N/A;  1:55  . ESOPHAGOGASTRODUODENOSCOPY N/A 12/31/2013   Procedure: ESOPHAGOGASTRODUODENOSCOPY (EGD);  Surgeon: [name], MD;  [facility];  Service: Endoscopy;  Laterality: N/A;  1200  . ESOPHAGOGASTRODUODENOSCOPY N/A 06/19/2014   Procedure: ESOPHAGOGASTRODUODENOSCOPY (EGD);  Surgeon: [name], MD;  [facility];  Service: Endoscopy;  Laterality: N/A;  1055  . ESOPHAGOGASTRODUODENOSCOPY N/A 01/15/2015   Procedure: ESOPHAGOGASTRODUODENOSCOPY (EGD);  Surgeon: [name], MD;  [facility];  Service: Endoscopy;  Laterality: N/A;  730 - moved to 9:45 - moved to 1250-Ann notified pt  . ESOPHAGOGASTRODUODENOSCOPY N/A 06/30/2015   Procedure: ESOPHAGOGASTRODUODENOSCOPY (EGD);  Surgeon: [name], MD;  [facility];  Service: Endoscopy;  Laterality: N/A;  1200  . ESOPHAGOGASTRODUODENOSCOPY N/A 01/05/2016   Procedure: ESOPHAGOGASTRODUODENOSCOPY (EGD);  Surgeon: [name], MD;  [facility];  Service: Endoscopy;  Laterality: N/A;  830  . ESOPHAGOGASTRODUODENOSCOPY N/A 08/03/2016   Procedure: ESOPHAGOGASTRODUODENOSCOPY (EGD);  Surgeon: [name], MD;  [facility];  Service: Endoscopy;  Laterality: N/A;  930  . ESOPHAGOGASTRODUODENOSCOPY N/A 05/02/2017   Procedure: ESOPHAGOGASTRODUODENOSCOPY (EGD);  Surgeon: [name], MD;  [facility];  Service: Endoscopy;  Laterality: N/A;  12:00  . ESOPHAGOGASTRODUODENOSCOPY  N/A 08/17/2017   Procedure: ESOPHAGOGASTRODUODENOSCOPY (EGD);  Surgeon: [name], MD;  [facility];  Service: Endoscopy;  Laterality: N/A;  8:15  . ESOPHAGOGASTRODUODENOSCOPY N/A 09/12/2017   Procedure: ESOPHAGOGASTRODUODENOSCOPY (EGD);  Surgeon: [name], MD;  [facility];  Service: Endoscopy;  Laterality: N/A;  1040  . ESOPHAGOGASTRODUODENOSCOPY N/A 10/10/2017   Procedure:  ESOPHAGOGASTRODUODENOSCOPY (EGD);  Surgeon: [name], MD;  [facility];  Service: Endoscopy;  Laterality: N/A;  225  . ESOPHAGOGASTRODUODENOSCOPY N/A 11/09/2017   Procedure: ESOPHAGOGASTRODUODENOSCOPY (EGD);  Surgeon: [name], MD;  [facility];  Service: Endoscopy;  Laterality: N/A;  . ESOPHAGOGASTRODUODENOSCOPY (EGD) WITH PROPOFOL  09/24/2012   Procedure: ESOPHAGOGASTRODUODENOSCOPY (EGD) WITH PROPOFOL;  Surgeon: [name], MD;  Location: [facility];  Service: Endoscopy;  Laterality: N/A;  GE junction at 36  . ESOPHAGOGASTRODUODENOSCOPY (EGD) WITH PROPOFOL N/A 07/06/2017   Procedure: ESOPHAGOGASTRODUODENOSCOPY (EGD) WITH PROPOFOL;  Surgeon: [name], MD;  [facility];  Service: Endoscopy;  Laterality: N/A;  . ESOPHAGOGASTRODUODENOSCOPY W/ BANDING  08/2010  . GIVENS CAPSULE STUDY N/A 07/05/2017   Procedure: GIVENS CAPSULE STUDY;  Surgeon: [name], MD;  [facility];  Service: Endoscopy;  Laterality: N/A;  . HOT HEMOSTASIS N/A 08/17/2017   Procedure: HOT HEMOSTASIS (ARGON PLASMA COAGULATION/BICAP);  Surgeon: [name], MD;  [facility];  Service: Endoscopy;  Laterality: N/A;  . HOT HEMOSTASIS  09/12/2017   Procedure: HOT HEMOSTASIS (ARGON PLASMA COAGULATION/BICAP);  Surgeon: [name], MD;  [facility];  Service: Endoscopy;;  gastric  . HOT HEMOSTASIS  10/10/2017   Procedure: HOT HEMOSTASIS (ARGON PLASMA COAGULATION/BICAP);  Surgeon: [name], MD;  [facility];  Service: Endoscopy;;  . POLYPECTOMY  03/24/2016   Procedure: POLYPECTOMY;  Surgeon: [name], MD;  [facility];  Service: Endoscopy;;  sigmoid polyp  . PORTACATH PLACEMENT Right 06/02/2019   Procedure: INSERTION PORT-A-CATH;  Surgeon: Aviva Signs, MD;  Location: [facility];  Service: General;  Laterality: Right;  . RIGHT HEART CATH N/A 04/16/2017   Procedure: Right Heart Cath;  Surgeon:  [name], MD;  Location: [facility] [facility];  Service: Cardiovascular;  Laterality: N/A;  . RIGHT HEART CATH N/A 07/12/2017   Procedure: RIGHT HEART CATH;  Surgeon: [name], MD;  Location: [facility];  Service: Cardiovascular;  Laterality: N/A;  . TONSILLECTOMY    . UPPER GASTROINTESTINAL ENDOSCOPY  03/15/2011   EGD ED BANDING/TCS  . UPPER GASTROINTESTINAL ENDOSCOPY  09/05/2010  . UPPER GASTROINTESTINAL ENDOSCOPY  08/11/2010  . VAGINAL HYSTERECTOMY       OB History   No obstetric history on file.     Family History  Problem Relation Age of Onset  . Lung cancer Mother   . Diabetes Father   . Diabetes Sister   . Hypertension Sister   . Hypothyroidism Sister   . Colon cancer Brother   . Diabetes Sister   . Hypothyroidism Brother   . Healthy Daughter   . Obesity Daughter   . Hypertension Daughter     Social History   Tobacco Use  . Smoking status: Never Smoker  . Smokeless tobacco: Never Used  Vaping Use  . Vaping Use: Never used  Substance Use Topics  . Alcohol use: No    Alcohol/week: 0.0 standard drinks  . Drug use: No    Home Medications Prior to Admission medications  Medication Sig Start Date End Date Taking? Authorizing Provider  bumetanide (BUMEX) 2 MG tablet Take 2 mg by mouth 2 (two) times daily.  09/10/19   Rehman, Mechele Dawley, MD  busPIRone (BUSPAR) 10 MG tablet Take 10 mg by mouth 2 (two) times daily.    [provider]  Cholecalciferol 25 MCG (1000 UT) tablet Take 1,000 Units by mouth every other day.     [provider]  ferrous sulfate 324 MG TBEC Take 324 mg by mouth at bedtime.    [provider]  HUMALOG 100 UNIT/ML injection Inject 15-35 Units into the skin 3 (three) times daily with meals. Sliding scale insulin 100-150= 15 units 150-200= 20 units 250-300= 30 units 300-350= 35 units 07/01/18   [provider]  Insulin Glargine (LANTUS SOLOSTAR) 100 UNIT/ML Solostar Pen Inject 10-60 Units  into the skin See admin instructions. Inject 55 units subcutaneously in the morning & 10 units subcutaneously  if needed, in addition 03/06/19   [provider]  lactulose (CHRONULAC) 10 GM/15ML solution Take 60 mLs by mouth in the morning, at noon, in the evening, and at bedtime. *If having watery stools more than 3 times daily, reduce to taking 3 times daily 02/25/20   [provider]  levothyroxine (SYNTHROID, LEVOTHROID) 25 MCG tablet Take 25 mcg by mouth daily before breakfast.     [provider]  lidocaine-prilocaine (EMLA) cream Apply 1 application topically daily as needed (applied to port). 07/06/20   Aviva Signs, MD  magnesium oxide (MAG-OX) 400 MG tablet Take 400 mg by mouth at bedtime.     [provider]  metolazone (ZAROXOLYN) 2.5 MG tablet Take 1 tablet (2.5 mg total) by mouth every other day as needed. 07/19/20   Harvel Quale, MD  Pediatric Multivitamins-Iron (FLINTSTONES PLUS IRON PO) Take 2 tablets by mouth daily.    [provider]  potassium chloride SA (KLOR-CON) 20 MEQ tablet Take 2 tablets (40 mEq total) by mouth daily. Patient taking differently: Take 20 mEq by mouth 2 (two) times daily.  09/02/19   Enzo Bi, MD  rifaximin (XIFAXAN) 550 MG TABS tablet Take 1 tablet (550 mg total) by mouth 2 (two) times daily. 02/27/19   Barton Dubois, MD  rosuvastatin (CRESTOR) 10 MG tablet Take 10 mg by mouth daily. 09/10/19   [provider]  sertraline (ZOLOFT) 50 MG tablet Take 50 mg by mouth daily.  03/10/19   [provider]  spironolactone (ALDACTONE) 100 MG tablet Take 100 mg by mouth daily.    [provider]  trimethoprim-polymyxin b (POLYTRIM) ophthalmic solution Place 1 drop into the left eye See admin instructions. Use 3 to 4 times daily for 2 days following monthly eye injection 05/06/18   [provider]  zinc sulfate 220 (50 Zn) MG capsule Take 1 capsule (220 mg total) by mouth daily.  08/01/18   Barton Dubois, MD    Allergies    Ferumoxytol and Tramadol hcl  Review of Systems   Review of Systems  Unable to perform ROS: Mental status change    Physical Exam Updated Vital Signs BP (!) 124/44 (BP Location: Right Arm)   Pulse 89   Temp 98.4 F (36.9 C) (Oral)   Resp 18   Ht 5' 3"  (1.6 m)   Wt 90 kg   SpO2 96%   BMI 35.15 kg/m   Physical Exam Vitals and nursing note reviewed.  Constitutional:      General: She is not in  acute distress.    Appearance: She is well-developed and well-nourished. She is obese.     Comments: Elderly female chronically ill-appearing, appears pale and mildly jaundice.  HENT:     Head: Atraumatic.     Mouth/Throat:     Mouth: Mucous membranes are moist.  Eyes:     General: Scleral icterus present.     Extraocular Movements: Extraocular movements intact.     Pupils: Pupils are equal, round, and reactive to light.  Cardiovascular:     Rate and Rhythm: Normal rate and regular rhythm.     Heart sounds: Murmur heard.    Pulmonary:     Effort: Pulmonary effort is normal.     Breath sounds: Normal breath sounds. No wheezing, rhonchi or rales.  Abdominal:     General: There is no distension.     Palpations: Abdomen is soft.     Tenderness: There is no abdominal tenderness.  Musculoskeletal:     Cervical back: Normal range of motion and neck supple. No rigidity.     Comments: Globally weak but able to move all 4 extremities  Skin:    Coloration: Skin is pale.     Findings: No rash.  Neurological:     Mental Status: She is alert.     GCS: GCS eye subscore is 4.     Motor: Weakness present.     Comments: Alert to self but not to situation, unable to recall location, month but able to recall the appropriate year.  Very slow to verbal response.  Take long pauses to answer after questions.  Mild asterixis  Psychiatric:        Mood and Affect: Mood and affect normal.     ED Results / Procedures / Treatments   Labs (all  labs ordered are listed, but only abnormal results are displayed) Labs Reviewed  CBC WITH DIFFERENTIAL/PLATELET - Abnormal; Notable for the following components:      Result Value   RBC 3.18 (*)    Hemoglobin 9.8 (*)    HCT 30.8 (*)    RDW 22.5 (*)    Platelets 66 (*)    Monocytes Absolute 1.1 (*)    All other components within normal limits  COMPREHENSIVE METABOLIC PANEL - Abnormal; Notable for the following components:   Sodium 130 (*)    Chloride 95 (*)    Glucose, Bld 409 (*)    BUN 76 (*)    Creatinine, Ser 2.22 (*)    Total Protein 5.9 (*)    Albumin 2.8 (*)    Total Bilirubin 4.8 (*)    GFR, Estimated 24 (*)    All other components within normal limits  AMMONIA - Abnormal; Notable for the following components:   Ammonia 49 (*)    All other components within normal limits  CBG MONITORING, ED - Abnormal; Notable for the following components:   Glucose-Capillary 337 (*)    All other components within normal limits  RESP PANEL BY RT-PCR (FLU A&B, COVID) ARPGX2  LIPASE, BLOOD  URINALYSIS, ROUTINE W REFLEX MICROSCOPIC    EKG None  Radiology DG Hip Unilat W or Wo Pelvis 2-3 Views Left  Result Date: 08/29/2020 CLINICAL DATA:  Hip pain EXAM: DG HIP (WITH OR WITHOUT PELVIS) 2-3V LEFT COMPARISON:  None. FINDINGS: Status post left hip total arthroplasty. No evidence of perihardware fracture or component malposition. Osteopenia. The included pelvis and proximal right femur are unremarkable. IMPRESSION: Status post left hip total arthroplasty. No evidence of perihardware  fracture or component malposition. Electronically Signed   By: Eddie Candle M.D.   On: 08/29/2020 14:52    Procedures Procedures (including critical care time)  Medications Ordered in ED Medications  sodium chloride 0.9 % bolus 1,000 mL (0 mLs Intravenous Stopped 08/29/20 1438)  lactulose (CHRONULAC) 10 GM/15ML solution 10 g (10 g Oral Given 08/29/20 1355)  insulin aspart (novoLOG) injection 10 Units (10  Units Subcutaneous Given 08/29/20 1427)    ED Course  I have reviewed the triage vital signs and the nursing notes.  Pertinent labs & imaging results that were available during my care of the patient were reviewed by me and considered in my medical decision making (see chart for details).    MDM Rules/Calculators/A&P                          BP (!) 116/33   Pulse 84   Temp 98.4 F (36.9 C) (Oral)   Resp 15   Ht 5' 3"  (1.6 m)   Wt 90 kg   SpO2 93%   BMI 35.15 kg/m   Final Clinical Impression(s) / ED Diagnoses Final diagnoses:  AKI (acute kidney injury) ([place])  Altered mental status, unspecified altered mental status type    Rx / DC Orders ED Discharge Orders    None     1:19 PM Patient with decompensated liver cirrhosis leading to hepatic encephalopathy who is here for elevated ammonia level despite being on lactulose at rehab facility.  At this time patient appears pale with mild jaundice, slow to answer questions and is indeed altered.  She is not appear to be any acute pain.  She is afebrile, vital signs stable, no hypoxia.  Work-up initiated.  3:16 PM Ammonia level was mildly elevated at 49.  She does have evidence of hyperglycemia with a CBG of 409 but normal anion gap.  She is dehydrated with impaired renal function as BUN is 76, creatinine is 2.22 but more elevated from her baseline.  Her total bili is 4.8.  She is currently receiving IV fluid, her Covid test is negative, her left hip x-ray without acute finding.  Patient also was given 10 units of insulin for her hyperglycemia.  Her UA is currently pending.  Will consult for admission.  Care discussed with Dr. Eulis Foster.  3:46 PM Appreciate consultation from [facility] Dr. Denton Brick who agrees to admit pt for dehydration and AKI along with mild hepatic encepalopathy.  From prior notes pt is very fluid fragile and can go into pulmonary edema with aggressive fluid resuscitation.  I made hospitalist aware.     Domenic Moras, PA-C 08/29/20 1548    Daleen Bo, MD 08/29/20 [phone]

## 2020-08-29 NOTE — ED Triage Notes (Signed)
Pt sent from [facility] after recent hip replacement. Reports that her ammonia levels are high despite being given lactulose at facility. Pt slightly altered.

## 2020-08-29 NOTE — ED Notes (Signed)
PT report called to 3rd floor nurse, verbalized complete understanding of pt plan of care and current condition denies questions at this time. PT resting on stretcher rails up x 2 call light in hand. PT remains lethargic but alert to person place. VSS NAD PT is hemodynamically stable and awaiting transport to clean ready room.

## 2020-08-29 NOTE — H&P (Addendum)
History and Physical    Maria [name] YNW:[id number] DOB: 01-25-1956 DOA: 08/29/2020  PCP: Glenda Chroman, MD   Patient coming from: [facility].  I have personally briefly reviewed patient's old medical records in [place]  Chief Complaint: Elevated ammonia, AMS  HPI: Maria [name] is a 64 y.o. female with medical history significant for nonalcoholic liver cirrhosis, esophageal varices and hepatic encephalopathy, GAVE, diabetes mellitus, coronary artery disease, pulmonary hypertension. Patient was brought to the ED from nursing home with reports of altered mental status and elevated ammonia levels 104 despite lactulose. On my evaluation patient answers a few questions, but history is limited from patient, spouse is at bedside and assist with the history.  Spouse last saw patient yesterday, reports patient is more slow to answer, and a bit confused compared to baseline.  Reports ammonia level was elevated, and fluids were given.  Also reports poor oral intake.  Spouse reports improvement in mental status after fluids given. Patient reports intermittent pain with urination, but no vomiting, no abdominal pain, no chest pain no difficulty breathing no cough. Patient had left hip arthroplasty a month ago- november, and was discharged to nursing home for rehab.  ED Course: Temperature 98.4.  Heart rate 15-21, blood pressure systolic 657-846.  O2 sats 93 -99% on room air.  Creatinine elevated 2.2, sodium 130, total bilirubin 4.8, ammonia level 49.  WBC 6.9. 10 units insulin,  1 L bolus and lactulose given, hospitalist to admit for acute kidney injury.  Review of Systems: As per HPI all other systems reviewed and negative.  Past Medical History:  Diagnosis Date  . Anemia   . CHF (congestive heart failure) ([place])   . Cirrhosis ([place])   . Depression   . Diabetes mellitus   . Esophageal bleed, non-variceal   . Eye hemorrhage    Behind left eye  . Fibromyalgia   .  Hypercholesteremia   . Hypertension   . Hypothyroidism   . NAFLD (nonalcoholic fatty liver disease)   . Osteoarthrosis   . Osteoporosis   . PONV (postoperative nausea and vomiting)   . UTI (urinary tract infection) 11/12 end of the month   Patient feels that she passed a kidney stone at that time    Past Surgical History:  Procedure Laterality Date  . APPENDECTOMY  1980  . BILATERAL SALPINGOOPHORECTOMY    . CHOLECYSTECTOMY    . COLONOSCOPY  03/15/2011  . COLONOSCOPY N/A 03/24/2016   Procedure: COLONOSCOPY;  Surgeon: [name], MD;  [facility];  Service: Endoscopy;  Laterality: N/A;  855  . ESOPHAGEAL BANDING  04/24/2012   Procedure: ESOPHAGEAL BANDING;  Surgeon: [name], MD;  [facility];  Service: Endoscopy;  Laterality: N/A;  . Esophageal BANDING  08/03/2012   [facility] in [place] , [place]  09/24/2012   Procedure: ESOPHAGEAL BANDING;  Surgeon: [name], MD;  [facility];  Service: Endoscopy;  Laterality: N/A;  Banding x 3  . ESOPHAGEAL BANDING N/A 01/29/2013   Procedure: ESOPHAGEAL BANDING;  Surgeon: [name], MD;  [facility];  Service: Endoscopy;  Laterality: N/A;  . ESOPHAGEAL BANDING N/A 06/19/2014   Procedure: ESOPHAGEAL BANDING;  Surgeon: [name], MD;  [facility];  Service: Endoscopy;  Laterality: N/A;  . ESOPHAGEAL BANDING N/A 01/05/2016   Procedure: ESOPHAGEAL BANDING;  Surgeon: [name], MD;  [facility];  Service: Endoscopy;  Laterality: N/A;  . ESOPHAGEAL BANDING N/A 08/03/2016   Procedure: ESOPHAGEAL BANDING;  Surgeon: [name], MD;  [facility];  Service: Endoscopy;  Laterality: N/A;  . ESOPHAGEAL BANDING N/A 05/02/2017   Procedure: ESOPHAGEAL BANDING;  Surgeon: [name], MD;  [facility];  Service: Endoscopy;  Laterality: N/A;  . ESOPHAGOGASTRODUODENOSCOPY  04/24/2012   Procedure:  ESOPHAGOGASTRODUODENOSCOPY (EGD);  Surgeon: [name], MD;  [facility];  Service: Endoscopy;  Laterality: N/A;  300  . ESOPHAGOGASTRODUODENOSCOPY N/A 01/29/2013   Procedure: ESOPHAGOGASTRODUODENOSCOPY (EGD);  Surgeon: [name], MD;  [facility];  Service: Endoscopy;  Laterality: N/A;  1200  . ESOPHAGOGASTRODUODENOSCOPY N/A 05/22/2013   Procedure: ESOPHAGOGASTRODUODENOSCOPY (EGD);  Surgeon: [name], MD;  [facility];  Service: Endoscopy;  Laterality: N/A;  1:55  . ESOPHAGOGASTRODUODENOSCOPY N/A 12/31/2013   Procedure: ESOPHAGOGASTRODUODENOSCOPY (EGD);  Surgeon: [name], MD;  [facility];  Service: Endoscopy;  Laterality: N/A;  1200  . ESOPHAGOGASTRODUODENOSCOPY N/A 06/19/2014   Procedure: ESOPHAGOGASTRODUODENOSCOPY (EGD);  Surgeon: [name], MD;  [facility];  Service: Endoscopy;  Laterality: N/A;  1055  . ESOPHAGOGASTRODUODENOSCOPY N/A 01/15/2015   Procedure: ESOPHAGOGASTRODUODENOSCOPY (EGD);  Surgeon: [name], MD;  [facility];  Service: Endoscopy;  Laterality: N/A;  730 - moved to 9:45 - moved to 1250-Ann notified pt  . ESOPHAGOGASTRODUODENOSCOPY N/A 06/30/2015   Procedure: ESOPHAGOGASTRODUODENOSCOPY (EGD);  Surgeon: [name], MD;  [facility];  Service: Endoscopy;  Laterality: N/A;  1200  . ESOPHAGOGASTRODUODENOSCOPY N/A 01/05/2016   Procedure: ESOPHAGOGASTRODUODENOSCOPY (EGD);  Surgeon: [name], MD;  [facility];  Service: Endoscopy;  Laterality: N/A;  830  . ESOPHAGOGASTRODUODENOSCOPY N/A 08/03/2016   Procedure: ESOPHAGOGASTRODUODENOSCOPY (EGD);  Surgeon: [name], MD;  [facility];  Service: Endoscopy;  Laterality: N/A;  930  . ESOPHAGOGASTRODUODENOSCOPY N/A 05/02/2017   Procedure: ESOPHAGOGASTRODUODENOSCOPY (EGD);  Surgeon: [name], MD;  [facility];  Service: Endoscopy;  Laterality: N/A;  12:00  .  ESOPHAGOGASTRODUODENOSCOPY N/A 08/17/2017   Procedure: ESOPHAGOGASTRODUODENOSCOPY (EGD);  Surgeon: [name], MD;  [facility];  Service: Endoscopy;  Laterality: N/A;  8:15  . ESOPHAGOGASTRODUODENOSCOPY N/A 09/12/2017   Procedure: ESOPHAGOGASTRODUODENOSCOPY (EGD);  Surgeon: [name], MD;  [facility];  Service: Endoscopy;  Laterality: N/A;  1040  . ESOPHAGOGASTRODUODENOSCOPY N/A 10/10/2017   Procedure: ESOPHAGOGASTRODUODENOSCOPY (EGD);  Surgeon: [name], MD;  [facility];  Service: Endoscopy;  Laterality: N/A;  225  . ESOPHAGOGASTRODUODENOSCOPY N/A 11/09/2017   Procedure: ESOPHAGOGASTRODUODENOSCOPY (EGD);  Surgeon: [name], MD;  [facility];  Service: Endoscopy;  Laterality: N/A;  . ESOPHAGOGASTRODUODENOSCOPY (EGD) WITH PROPOFOL  09/24/2012   Procedure: ESOPHAGOGASTRODUODENOSCOPY (EGD) WITH PROPOFOL;  Surgeon: [name], MD;  [facility];  Service: Endoscopy;  Laterality: N/A;  GE junction at 36  . ESOPHAGOGASTRODUODENOSCOPY (EGD) WITH PROPOFOL N/A 07/06/2017   Procedure: ESOPHAGOGASTRODUODENOSCOPY (EGD) WITH PROPOFOL;  Surgeon: [name], MD;  [facility];  Service: Endoscopy;  Laterality: N/A;  . ESOPHAGOGASTRODUODENOSCOPY W/ BANDING  08/2010  . GIVENS CAPSULE STUDY N/A 07/05/2017   Procedure: GIVENS CAPSULE STUDY;  Surgeon: [name], MD;  [facility];  Service: Endoscopy;  Laterality: N/A;  . HOT HEMOSTASIS N/A 08/17/2017   Procedure: HOT HEMOSTASIS (ARGON PLASMA COAGULATION/BICAP);  Surgeon: Hildred Laser  U, MD;  [facility];  Service: Endoscopy;  Laterality: N/A;  . HOT HEMOSTASIS  09/12/2017   Procedure: HOT HEMOSTASIS (ARGON PLASMA COAGULATION/BICAP);  Surgeon: [name], MD;  [facility];  Service: Endoscopy;;  gastric  . HOT HEMOSTASIS  10/10/2017   Procedure: HOT HEMOSTASIS (ARGON PLASMA COAGULATION/BICAP);  Surgeon: [name], MD;  [facility];  Service: Endoscopy;;  . POLYPECTOMY  03/24/2016   Procedure: POLYPECTOMY;  Surgeon: [name], MD;  [facility];  Service: Endoscopy;;  sigmoid polyp  . PORTACATH PLACEMENT Right 06/02/2019   Procedure: INSERTION PORT-A-CATH;  Surgeon: Aviva Signs, MD;  [facility];  Service: General;  Laterality: Right;  . RIGHT HEART CATH N/A 04/16/2017   Procedure: Right Heart Cath;  Surgeon: [name], MD;  Location: [place] [facility];  Service: Cardiovascular;  Laterality: N/A;  . RIGHT HEART CATH N/A 07/12/2017   Procedure: RIGHT HEART CATH;  Surgeon: [name], MD;  Location: [place] [facility];  Service: Cardiovascular;  Laterality: N/A;  . TONSILLECTOMY    . UPPER GASTROINTESTINAL ENDOSCOPY  03/15/2011   EGD ED BANDING/TCS  . UPPER GASTROINTESTINAL ENDOSCOPY  09/05/2010  . UPPER GASTROINTESTINAL ENDOSCOPY  08/11/2010  . VAGINAL HYSTERECTOMY       reports that she has never smoked. She has never used smokeless tobacco. She reports that she does not drink alcohol and does not use drugs.  Allergies  Allergen Reactions  . Ferumoxytol Other (See Comments)    Patient has severe back and chest pain when receiving FERAHEME infusions.   . Tramadol Hcl Other (See Comments)    WEAKNESS     Family History  Problem Relation Age of Onset  . Lung cancer Mother   . Diabetes Father   . Diabetes Sister   . Hypertension Sister   . Hypothyroidism Sister   . Colon cancer Brother   . Diabetes Sister   . Hypothyroidism Brother   . Healthy Daughter   . Obesity Daughter   . Hypertension Daughter     Prior to Admission medications   Medication Sig Start Date End Date Taking? Authorizing Provider  bumetanide (BUMEX) 2 MG tablet Take 2 mg by mouth 2 (two) times daily.  09/10/19   Rehman, Mechele Dawley, MD  busPIRone (BUSPAR) 10 MG tablet Take 10 mg by mouth 2 (two) times daily.    [provider]  Cholecalciferol 25 MCG (1000 UT)  tablet Take 1,000 Units by mouth every other day.     [provider]  ferrous sulfate 324 MG TBEC Take 324 mg by mouth at bedtime.    [provider]  HUMALOG 100 UNIT/ML injection Inject 15-35 Units into the skin 3 (three) times daily with meals. Sliding scale insulin 100-150= 15 units 150-200= 20 units 250-300= 30 units 300-350= 35 units 07/01/18   [provider]  Insulin Glargine (LANTUS SOLOSTAR) 100 UNIT/ML Solostar Pen Inject 10-60 Units into the skin See admin instructions. Inject 55 units subcutaneously in the morning & 10 units subcutaneously  if needed, in addition 03/06/19   [provider]  lactulose (CHRONULAC) 10 GM/15ML solution Take 60 mLs by mouth in the morning, at noon, in the evening, and at bedtime. *If having watery stools more than 3 times daily, reduce to taking 3 times daily 02/25/20   [provider]  levothyroxine (SYNTHROID, LEVOTHROID) 25 MCG tablet Take 25 mcg by mouth daily before breakfast.  [provider]  lidocaine-prilocaine (EMLA) cream Apply 1 application topically daily as needed (applied to port). 07/06/20   Aviva Signs, MD  magnesium oxide (MAG-OX) 400 MG tablet Take 400 mg by mouth at bedtime.     [provider]  metolazone (ZAROXOLYN) 2.5 MG tablet Take 1 tablet (2.5 mg total) by mouth every other day as needed. 07/19/20   Harvel Quale, MD  Pediatric Multivitamins-Iron (FLINTSTONES PLUS IRON PO) Take 2 tablets by mouth daily.    [provider]  potassium chloride SA (KLOR-CON) 20 MEQ tablet Take 2 tablets (40 mEq total) by mouth daily. Patient taking differently: Take 20 mEq by mouth 2 (two) times daily.  09/02/19   Enzo Bi, MD  rifaximin (XIFAXAN) 550 MG TABS tablet Take 1 tablet (550 mg total) by mouth 2 (two) times daily. 02/27/19   Barton Dubois, MD  rosuvastatin (CRESTOR) 10 MG tablet Take 10 mg by mouth daily. 09/10/19   [provider]  sertraline  (ZOLOFT) 50 MG tablet Take 50 mg by mouth daily.  03/10/19   [provider]  spironolactone (ALDACTONE) 100 MG tablet Take 100 mg by mouth daily.    [provider]  trimethoprim-polymyxin b (POLYTRIM) ophthalmic solution Place 1 drop into the left eye See admin instructions. Use 3 to 4 times daily for 2 days following monthly eye injection 05/06/18   [provider]  zinc sulfate 220 (50 Zn) MG capsule Take 1 capsule (220 mg total) by mouth daily. 08/01/18   Barton Dubois, MD    Physical Exam: Vitals:   08/29/20 1330 08/29/20 1400 08/29/20 1430 08/29/20 1530  BP: (!) 121/33 (!) 114/42 (!) 116/33 (!) 114/39  Pulse: 87 88 84 88  Resp: (!) 21 16 15 20   Temp:      TempSrc:      SpO2: 91% 97% 93% 97%  Weight:      Height:        Constitutional: NAD, calm, comfortable Vitals:   08/29/20 1330 08/29/20 1400 08/29/20 1430 08/29/20 1530  BP: (!) 121/33 (!) 114/42 (!) 116/33 (!) 114/39  Pulse: 87 88 84 88  Resp: (!) 21 16 15 20   Temp:      TempSrc:      SpO2: 91% 97% 93% 97%  Weight:      Height:       Eyes: PERRL, lids normal, icteric conjunctiva ENMT: Mucous membranes are mildly dry.   Neck: normal, supple, no masses, no thyromegaly Respiratory: clear to auscultation bilaterally, no wheezing, no crackles. Normal respiratory effort. No accessory muscle use.  Cardiovascular: Regular rate and rhythm, no murmurs / rubs / gallops. No extremity edema. 2+ pedal pulses.   Abdomen: no tenderness, no masses palpated. No hepatosplenomegaly. Bowel sounds positive.  Musculoskeletal: no clubbing / cyanosis. No joint deformity upper and lower extremities. Good ROM, no contractures. Normal muscle tone.  Skin: Jaundiced, no rashes, lesions, ulcers. No induration Neurologic: No apparent cranial nerve abnormality, strength 5/5 in all 4.  Psychiatric: Awake and alert, able to tell me her name, identify her husband by name, and tell me she is here for high ammonia levels, but  she is not able to tell me where she is.   Labs on Admission: I have personally reviewed following labs and imaging studies  CBC: Recent Labs  Lab 08/29/20 1312  WBC 6.9  NEUTROABS 4.6  HGB 9.8*  HCT 30.8*  MCV 96.9  PLT 66*   Basic Metabolic Panel: Recent Labs  Lab 08/29/20 1312  NA 130*  K 4.2  CL 95*  CO2 24  GLUCOSE 409*  BUN 76*  CREATININE 2.22*  CALCIUM 8.9   Liver Function Tests: Recent Labs  Lab 08/29/20 1312  AST 34  ALT 29  ALKPHOS 119  BILITOT 4.8*  PROT 5.9*  ALBUMIN 2.8*   Recent Labs  Lab 08/29/20 1312  LIPASE 45   Recent Labs  Lab 08/29/20 1313  AMMONIA 49*   CBG: Recent Labs  Lab 08/29/20 1416  GLUCAP 337*    Radiological Exams on Admission: DG Hip Unilat W or Wo Pelvis 2-3 Views Left  Result Date: 08/29/2020 CLINICAL DATA:  Hip pain EXAM: DG HIP (WITH OR WITHOUT PELVIS) 2-3V LEFT COMPARISON:  None. FINDINGS: Status post left hip total arthroplasty. No evidence of perihardware fracture or component malposition. Osteopenia. The included pelvis and proximal right femur are unremarkable. IMPRESSION: Status post left hip total arthroplasty. No evidence of perihardware fracture or component malposition. Electronically Signed   By: Eddie Candle M.D.   On: 08/29/2020 14:52    EKG: None.   Assessment/Plan Principal Problem:   AKI (acute kidney injury) ([place]) Active Problems:   Metabolic encephalopathy   Cirrhosis, nonalcoholic (H[facility])   Diabetes mellitus ([place])   History of esophageal varices   Iron deficiency anemia   Chronic diastolic heart failure (H[facility])   CAD (coronary artery disease)   GAVE (gastric antral vascular ectasia)  AKI-creatinine 2.2, baseline 1.1-1.3.  Likely due to dehydration from poor oral intake, hyperglycemia and diuretic -Bumex 2 mg twice daily. -1 L bolus given, continue N/s 75cc/hr x 15hrs -Hold Bumex, metolazone and spironolactone -CMP in a.m.  Metabolic encephalopathy-likely due to dehydration, improving  with IV fluids.  Ammonia level 49.   -Obtain head CT -Continue IV fluids -Resume sertraline  Diabetes mellitus with hyperglycemia-blood glucose 409 > 265.  Normal anion gap of 11 and serum bicarb of 24. - Hgba1c - SSI- S -Resume Lantus 60 units daily  Recent left hip arthroplasty- 07/2020, at [facility].  Chronic iron deficiency anemia-hemoglobin stable at 9.8.  No sign of GI blood loss at this time.  History of GAVE and esophageal varices.  Requires frequent blood transfusions. -Resume iron supplements  Chronic diastolic CHF-stable and compensated.  Currently dehydrated requiring IV fluids.Echo 2018 EF 60 to 65%, indeterminate diastolic dysfunction. -Hydrate -Hold diuretics  Nonalcoholic liver cirrhosis with history of esophageal varices and hepatic encephalopathy-stable.  Ammonia level 49-improved compared to prior. -Resume lactulose and rifaximin  Thrombocytopenia-66.  Recent baseline 38-49.   DVT prophylaxis: SCDs, due to thrombocytopenia Code Status: Full code Family Communication: Spouse at bedside Disposition Plan:  ~ 1 - 2 days Consults called: None Admission status: Observation, MedSurg   Bethena Roys MD [facility]  08/29/2020, 6:00 PM

## 2020-08-30 ENCOUNTER — Other Ambulatory Visit: Payer: Self-pay

## 2020-08-30 DIAGNOSIS — Z794 Long term (current) use of insulin: Secondary | ICD-10-CM

## 2020-08-30 DIAGNOSIS — D500 Iron deficiency anemia secondary to blood loss (chronic): Secondary | ICD-10-CM

## 2020-08-30 DIAGNOSIS — K31819 Angiodysplasia of stomach and duodenum without bleeding: Secondary | ICD-10-CM

## 2020-08-30 DIAGNOSIS — I2510 Atherosclerotic heart disease of native coronary artery without angina pectoris: Secondary | ICD-10-CM

## 2020-08-30 DIAGNOSIS — Z8719 Personal history of other diseases of the digestive system: Secondary | ICD-10-CM

## 2020-08-30 DIAGNOSIS — E1169 Type 2 diabetes mellitus with other specified complication: Secondary | ICD-10-CM

## 2020-08-30 DIAGNOSIS — K7460 Unspecified cirrhosis of liver: Secondary | ICD-10-CM

## 2020-08-30 DIAGNOSIS — I5032 Chronic diastolic (congestive) heart failure: Secondary | ICD-10-CM

## 2020-08-30 LAB — CBC
HCT: 25.6 % — ABNORMAL LOW (ref 36.0–46.0)
Hemoglobin: 8.1 g/dL — ABNORMAL LOW (ref 12.0–15.0)
MCH: 30.7 pg (ref 26.0–34.0)
MCHC: 31.6 g/dL (ref 30.0–36.0)
MCV: 97 fL (ref 80.0–100.0)
Platelets: 52 10*3/uL — ABNORMAL LOW (ref 150–400)
RBC: 2.64 MIL/uL — ABNORMAL LOW (ref 3.87–5.11)
RDW: 22.2 % — ABNORMAL HIGH (ref 11.5–15.5)
WBC: 5.3 10*3/uL (ref 4.0–10.5)
nRBC: 0 % (ref 0.0–0.2)

## 2020-08-30 LAB — COMPREHENSIVE METABOLIC PANEL
ALT: 26 U/L (ref 0–44)
AST: 29 U/L (ref 15–41)
Albumin: 2.5 g/dL — ABNORMAL LOW (ref 3.5–5.0)
Alkaline Phosphatase: 100 U/L (ref 38–126)
Anion gap: 8 (ref 5–15)
BUN: 65 mg/dL — ABNORMAL HIGH (ref 8–23)
CO2: 22 mmol/L (ref 22–32)
Calcium: 8.5 mg/dL — ABNORMAL LOW (ref 8.9–10.3)
Chloride: 101 mmol/L (ref 98–111)
Creatinine, Ser: 1.65 mg/dL — ABNORMAL HIGH (ref 0.44–1.00)
GFR, Estimated: 34 mL/min — ABNORMAL LOW (ref >60)
Glucose, Bld: 235 mg/dL — ABNORMAL HIGH (ref 70–99)
Potassium: 3.5 mmol/L (ref 3.5–5.1)
Sodium: 131 mmol/L — ABNORMAL LOW (ref 135–145)
Total Bilirubin: 4.1 mg/dL — ABNORMAL HIGH (ref 0.3–1.2)
Total Protein: 5.1 g/dL — ABNORMAL LOW (ref 6.5–8.1)

## 2020-08-30 LAB — GLUCOSE, CAPILLARY
Glucose-Capillary: 270 mg/dL — ABNORMAL HIGH (ref 70–99)
Glucose-Capillary: 203 mg/dL — ABNORMAL HIGH (ref 70–99)
Glucose-Capillary: 292 mg/dL — ABNORMAL HIGH (ref 70–99)
Glucose-Capillary: 249 mg/dL — ABNORMAL HIGH (ref 70–99)
Glucose-Capillary: 217 mg/dL — ABNORMAL HIGH (ref 70–99)

## 2020-08-30 LAB — MRSA PCR SCREENING: MRSA by PCR: NEGATIVE

## 2020-08-30 LAB — SARS CORONAVIRUS 2 BY RT PCR (HOSPITAL ORDER, PERFORMED IN ~~LOC~~ HOSPITAL LAB): SARS Coronavirus 2: NEGATIVE

## 2020-08-30 LAB — AMMONIA: Ammonia: 53 umol/L — ABNORMAL HIGH (ref 9–35)

## 2020-08-30 MED ORDER — SODIUM CHLORIDE 0.9 % IV SOLN: INTRAVENOUS | Status: AC

## 2020-08-30 MED ORDER — INSULIN ASPART 100 UNIT/ML ~~LOC~~ SOLN
10.0000 [IU] | Freq: Three times a day (TID) | SUBCUTANEOUS | Status: DC
  Administered 2020-08-30 – 2020-08-31 (×3): 10 [IU] via SUBCUTANEOUS

## 2020-08-30 NOTE — [place] Notes (Signed)
PROGRESS NOTE   Maria [name]  FUX:[id number] DOB: 16-Aug-1956 DOA: 08/29/2020 PCP: Glenda Chroman, MD   Chief Complaint  Patient presents with  . Altered Mental Status    Brief Admission History:  64 y.o. female with medical history significant for nonalcoholic liver cirrhosis, esophageal varices and hepatic encephalopathy, GAVE, diabetes mellitus, coronary artery disease, pulmonary hypertension.  Patient was brought to the ED from nursing home with reports of altered mental status and elevated ammonia levels 104 despite lactulose.  On my evaluation patient answers a few questions, but history is limited from patient, spouse is at bedside and assist with the history.  Spouse last saw patient yesterday, reports patient is more slow to answer, and a bit confused compared to baseline.  Reports ammonia level was elevated, and fluids were given.  Also reports poor oral intake.  Spouse reports improvement in mental status after fluids given.  Patient reports intermittent pain with urination, but no vomiting, no abdominal pain, no chest pain no difficulty breathing no cough.  Patient had left hip arthroplasty a month ago- november, and was discharged to nursing home for rehab.    ED Course: Temperature 98.4.  Heart rate 15-21, blood pressure systolic 025-427.  O2 sats 93 -99% on room air.  Creatinine elevated 2.2, sodium 130, total bilirubin 4.8, ammonia level 49.  WBC 6.9.  10 units insulin,  1 L bolus and lactulose given, hospitalist to admit for acute kidney injury.  Assessment & Plan:   Principal Problem:   AKI (acute kidney injury) ([place]) Active Problems:   Cirrhosis, nonalcoholic ([place])   Diabetes mellitus ([place])   History of esophageal varices   Iron deficiency anemia   Chronic diastolic heart failure ([facility])   CAD (coronary artery disease)   GAVE (gastric antral vascular ectasia)   Metabolic encephalopathy  Acute kidney injury - prerenal, holding diuretics and gently hydrating with IV  fluids, renal function improving, continue IV fluid.   Acute metabolic encephalopathy - improved to resolved now.    Type 2 diabetes mellitus with hyperglycemia - restarted on basal bolus with SSI coverage and frequent CBG monitoring.   S/p left hip arthroplasty - post op care follow up with orthopedics  Nonalcoholic liver cirrhosis - resumed home lactulose and rifaximin.   Chronic thrombocytopenia -stable.   Chronic diastolic CHF - temporarily holding home diuretics due to AKI and dehydration.   DVT prophylaxis:  SCD Code Status:  Full  Family Communication:  Spouse  Disposition:  SNF - awaiting insurance authorization   Status is: Observation  The patient remains OBS appropriate and will d/c before 2 midnights.  Dispo: The patient is from: SNF              Anticipated d/c is to: SNF              Anticipated d/c date is: 1 day              Patient currently is medically stable to d/c.  Consultants:     Procedures:     Antimicrobials:     Subjective: Pt says she is feeling better today.  No complaints.  She is oriented x 3.   Objective: Vitals:   08/29/20 2200 08/30/20 0100 08/30/20 0101 08/30/20 0553  BP: (!) 124/46 (!) 121/41 (!) 123/44 (!) 127/42  Pulse:    86  Resp: 20 20  20   Temp: 97.7 F (36.5 C) 98.2 F (36.8 C)  98.1 F (36.7 C)  TempSrc: Oral Oral  Oral  SpO2: 100% 98%  96%  Weight: 75.8 kg     Height: 5' 3"  (1.6 m)       Intake/Output Summary (Last 24 hours) at 08/30/2020 1326 Last data filed at 08/30/2020 0647 Gross per 24 hour  Intake 1233.6 ml  Output --  Net 1233.6 ml   Filed Weights   08/29/20 1257 08/29/20 2200  Weight: 90 kg 75.8 kg    Examination:  General exam: Appears calm and comfortable  Respiratory system: Clear to auscultation. Respiratory effort normal. Cardiovascular system: S1 & S2 heard, RRR. No JVD, murmurs, rubs, gallops or clicks. No pedal edema. Gastrointestinal system: Abdomen is nondistended, soft and  nontender. No organomegaly or masses felt. Normal bowel sounds heard. Central nervous system: Alert and oriented. No focal neurological deficits. Extremities: Symmetric 5 x 5 power. Skin: No rashes, lesions or ulcers Psychiatry: Judgement and insight appear normal. Mood & affect appropriate.   Data Reviewed: I have personally reviewed following labs and imaging studies  CBC: Recent Labs  Lab 08/29/20 1312 08/30/20 0549  WBC 6.9 5.3  NEUTROABS 4.6  --   HGB 9.8* 8.1*  HCT 30.8* 25.6*  MCV 96.9 97.0  PLT 66* 52*    Basic Metabolic Panel: Recent Labs  Lab 08/29/20 1312 08/30/20 0549  NA 130* 131*  K 4.2 3.5  CL 95* 101  CO2 24 22  GLUCOSE 409* 235*  BUN 76* 65*  CREATININE 2.22* 1.65*  CALCIUM 8.9 8.5*    GFR: Estimated Creatinine Clearance: 33.2 mL/min (A) (by C-G formula based on SCr of 1.65 mg/dL (H)).  Liver Function Tests: Recent Labs  Lab 08/29/20 1312 08/30/20 0549  AST 34 29  ALT 29 26  ALKPHOS 119 100  BILITOT 4.8* 4.1*  PROT 5.9* 5.1*  ALBUMIN 2.8* 2.5*    CBG: Recent Labs  Lab 08/29/20 1416 08/29/20 1814 08/30/20 0106 08/30/20 0751 08/30/20 1100  GLUCAP 337* 265* 270* 203* 292*    Recent Results (from the past 240 hour(s))  Resp Panel by RT-PCR (Flu A&B, Covid) Nasopharyngeal Swab     Status: None   Collection Time: 08/29/20  1:52 PM   Specimen: Nasopharyngeal Swab; Nasopharyngeal(NP) swabs in vial transport medium  Result Value Ref Range Status   SARS Coronavirus 2 by RT PCR NEGATIVE NEGATIVE Final    Comment: (NOTE) SARS-CoV-2 target nucleic acids are NOT DETECTED.  The SARS-CoV-2 RNA is generally detectable in upper respiratory specimens during the acute phase of infection. The lowest concentration of SARS-CoV-2 viral copies this assay can detect is 138 copies/mL. A negative result does not preclude SARS-Cov-2 infection and should not be used as the sole basis for treatment or other patient management decisions. A negative result  may occur with  improper specimen collection/handling, submission of specimen other than nasopharyngeal swab, presence of viral mutation(s) within the areas targeted by this assay, and inadequate number of viral copies(<138 copies/mL). A negative result must be combined with clinical observations, patient history, and epidemiological information. The expected result is Negative.  Fact Sheet for Patients:  EntrepreneurPulse.com.au  Fact Sheet for Healthcare Providers:  IncredibleEmployment.be  This test is no t yet approved or cleared by the [place] FDA and  has been authorized for detection and/or diagnosis of SARS-CoV-2 by FDA under an Emergency Use Authorization (EUA). This EUA will remain  in effect (meaning this test can be used) for the duration of the COVID-19 declaration under Section 564(b)(1) of the Act, 21 U.S.C.section 360bbb-3(b)(1), unless the authorization is  terminated  or revoked sooner.       Influenza A by PCR NEGATIVE NEGATIVE Final   Influenza B by PCR NEGATIVE NEGATIVE Final    Comment: (NOTE) The Xpert Xpress SARS-CoV-2/FLU/RSV plus assay is intended as an aid in the diagnosis of influenza from Nasopharyngeal swab specimens and should not be used as a sole basis for treatment. Nasal washings and aspirates are unacceptable for Xpert Xpress SARS-CoV-2/FLU/RSV testing.  Fact Sheet for Patients: EntrepreneurPulse.com.au  Fact Sheet for Healthcare Providers: IncredibleEmployment.be  This test is not yet approved or cleared by the [place] FDA and has been authorized for detection and/or diagnosis of SARS-CoV-2 by FDA under an Emergency Use Authorization (EUA). This EUA will remain in effect (meaning this test can be used) for the duration of the COVID-19 declaration under Section 564(b)(1) of the Act, 21 U.S.C. section 360bbb-3(b)(1), unless the authorization is terminated  or revoked.  Performed at [facility], [address], [place], [place] [address]   MRSA PCR Screening     Status: None   Collection Time: 08/30/20 12:58 AM   Specimen: Nasal Mucosa; Nasopharyngeal  Result Value Ref Range Status   MRSA by PCR NEGATIVE NEGATIVE Final    Comment:        The GeneXpert MRSA Assay (FDA approved for NASAL specimens only), is one component of a comprehensive MRSA colonization surveillance program. It is not intended to diagnose MRSA infection nor to guide or monitor treatment for MRSA infections. Performed at [facility], [address], [place], [place] [address]   SARS Coronavirus 2 by RT PCR (hospital order, performed in [facility] hospital lab) Nasopharyngeal Nasopharyngeal Swab     Status: None   Collection Time: 08/30/20 10:15 AM   Specimen: Nasopharyngeal Swab  Result Value Ref Range Status   SARS Coronavirus 2 NEGATIVE NEGATIVE Final    Comment: (NOTE) SARS-CoV-2 target nucleic acids are NOT DETECTED.  The SARS-CoV-2 RNA is generally detectable in upper and lower respiratory specimens during the acute phase of infection. The lowest concentration of SARS-CoV-2 viral copies this assay can detect is 250 copies / mL. A negative result does not preclude SARS-CoV-2 infection and should not be used as the sole basis for treatment or other patient management decisions.  A negative result may occur with improper specimen collection / handling, submission of specimen other than nasopharyngeal swab, presence of viral mutation(s) within the areas targeted by this assay, and inadequate number of viral copies (<250 copies / mL). A negative result must be combined with clinical observations, patient history, and epidemiological information.  Fact Sheet for Patients:   StrictlyIdeas.no  Fact Sheet for Healthcare Providers: BankingDealers.co.za  This test is not yet approved or  cleared by the [place] FDA and has been authorized for detection and/or diagnosis of SARS-CoV-2 by FDA under an Emergency Use Authorization (EUA).  This EUA will remain in effect (meaning this test can be used) for the duration of the COVID-19 declaration under Section 564(b)(1) of the Act, 21 U.S.C. section 360bbb-3(b)(1), unless the authorization is terminated or revoked sooner.  Performed at [facility], [address], [place], [place] [address]      Radiology Studies: CT HEAD WO CONTRAST  Result Date: 08/29/2020 CLINICAL DATA:  Altered mental status. Thrombocytopenia. EXAM: CT HEAD WITHOUT CONTRAST TECHNIQUE: Contiguous axial images were obtained from the base of the skull through the vertex without intravenous contrast. COMPARISON:  Head CT 08/27/2019 FINDINGS: Brain: Mild generalized atrophy, normal for age. No intracranial  hemorrhage, mass effect, or midline shift. No hydrocephalus. The basilar cisterns are patent. No evidence of territorial infarct or acute ischemia. No extra-axial or intracranial fluid collection. Vascular: Atherosclerosis of skullbase vasculature without hyperdense vessel or abnormal calcification. Skull: No fracture or focal lesion. Sinuses/Orbits: Near completely resolved opacification of the left maxillary sinus with trace residual mucosal thickening. There is mild mucosal thickening involving scattered ethmoid air cells in the left frontal sinus. No mastoid effusion. Orbits are unremarkable. Other: None. IMPRESSION: No acute intracranial abnormality. Electronically Signed   By: Keith Rake M.D.   On: 08/29/2020 18:25   DG Hip Unilat W or Wo Pelvis 2-3 Views Left  Result Date: 08/29/2020 CLINICAL DATA:  Hip pain EXAM: DG HIP (WITH OR WITHOUT PELVIS) 2-3V LEFT COMPARISON:  None. FINDINGS: Status post left hip total arthroplasty. No evidence of perihardware fracture or component malposition. Osteopenia. The included pelvis and proximal right femur are unremarkable. IMPRESSION:  Status post left hip total arthroplasty. No evidence of perihardware fracture or component malposition. Electronically Signed   By: Eddie Candle M.D.   On: 08/29/2020 14:52   Scheduled Meds: . busPIRone  10 mg Oral BID  . ferrous sulfate  325 mg Oral Q breakfast  . insulin aspart  0-5 Units Subcutaneous QHS  . insulin aspart  0-9 Units Subcutaneous TID WC  . insulin aspart  10 Units Subcutaneous TID WC  . insulin glargine  50 Units Subcutaneous Daily  . lactulose  40 g Oral TID  . levothyroxine  25 mcg Oral QAC breakfast  . rifaximin  550 mg Oral BID  . rosuvastatin  10 mg Oral q1800  . sertraline  50 mg Oral Daily   Continuous Infusions: . sodium chloride       LOS: 0 days   Time spent: 36 mins  Teodoro Jeffreys Wynetta Emery, MD How to contact the [facility] Attending or Consulting provider [place] or covering provider during after hours [place], for this patient?  1. Check the care team in [facility] and look for a) attending/consulting TRH provider listed and b) the [facility] team listed 2. Log into [email or web address] and use [place]'s universal password to access. If you do not have the password, please contact the hospital operator. 3. Locate the [facility] provider you are looking for under [facility] and page to a number that you can be directly reached. 4. If you still have difficulty reaching the provider, please page the [facility] (Director on Call) for the Hospitalists listed on amion for assistance.  08/30/2020, 1:26 PM

## 2020-08-30 NOTE — [place] Notes (Signed)
The patient is admitted to room 308 with the diagnosis of AKI. Alert and oriented x 4 with some forgetfulness. Patient denies any acute pain. Oriented patient to call bell/ascom and staff. Will continue to monitor.

## 2020-08-30 NOTE — Care Management Obs Status (Signed)
[place] NOTIFICATION   Patient Details  Name: Maria [name] MRN: [id number] Date of Birth: July 02, 1956   Medicare Observation Status Notification Given:  Yes    Iona Beard, Latanya Presser 08/30/2020, 6:39 PM

## 2020-08-30 NOTE — TOC [place] Note (Signed)
Transition of Care [facility]) - [place] Note    Patient Details  Name: Maria [name] MRN: [id number] Date of Birth: 1956/03/21  Transition of Care [facility]) CM/SW Contact  Boneta Lucks, RN Phone Number: 08/30/2020, 10:24 AM  Clinical Narrative:   Patient medically ready to return to [facility], [facility] confirmed with Mardene Celeste. Patient is needing INS Auth to return. TOC started with with Marlowe Kays at  [facility] for SNF and EMS. Advised patient is medically ready.  Patient needs Covid test. Good for 48 hours for [facility].       Barriers to Discharge: Insurance Authorization  Expected Discharge Plan and Services   Readmission Risk Interventions Readmission Risk Prevention Plan 10/05/2019 09/01/2019 08/29/2019  Transportation Screening Complete - Complete  PCP or Specialist Appt within 3-5 Days Complete Complete -  HRI or [place] Not Complete Complete -  HRI or Home Care Consult comments Pt declined - -  Social Work Consult for [place] Planning/Counseling Not Complete Complete -  SW consult not completed comments N/A - -  Palliative Care Screening Not Complete Not Applicable -  Medication Review Press photographer) Complete - Complete  PCP or Specialist appointment within 3-5 days of discharge - - Not Complete  HRI or [place] - - Complete  SW Recovery Care/Counseling Consult - - Complete  Palliative Care Screening - - Not Complete  [place] - - Not Complete  Some recent data might be hidden

## 2020-08-31 DIAGNOSIS — I2510 Atherosclerotic heart disease of native coronary artery without angina pectoris: Secondary | ICD-10-CM | POA: Diagnosis not present

## 2020-08-31 DIAGNOSIS — Z800 Family history of malignant neoplasm of digestive organs: Secondary | ICD-10-CM | POA: Diagnosis not present

## 2020-08-31 DIAGNOSIS — K7460 Unspecified cirrhosis of liver: Secondary | ICD-10-CM | POA: Diagnosis not present

## 2020-08-31 DIAGNOSIS — I5032 Chronic diastolic (congestive) heart failure: Secondary | ICD-10-CM | POA: Diagnosis not present

## 2020-08-31 DIAGNOSIS — D509 Iron deficiency anemia, unspecified: Secondary | ICD-10-CM | POA: Diagnosis present

## 2020-08-31 DIAGNOSIS — Z79899 Other long term (current) drug therapy: Secondary | ICD-10-CM | POA: Diagnosis not present

## 2020-08-31 DIAGNOSIS — Z833 Family history of diabetes mellitus: Secondary | ICD-10-CM | POA: Diagnosis not present

## 2020-08-31 DIAGNOSIS — G9341 Metabolic encephalopathy: Secondary | ICD-10-CM | POA: Diagnosis present

## 2020-08-31 DIAGNOSIS — Z8249 Family history of ischemic heart disease and other diseases of the circulatory system: Secondary | ICD-10-CM | POA: Diagnosis not present

## 2020-08-31 DIAGNOSIS — K7581 Nonalcoholic steatohepatitis (NASH): Secondary | ICD-10-CM | POA: Diagnosis present

## 2020-08-31 DIAGNOSIS — E1165 Type 2 diabetes mellitus with hyperglycemia: Secondary | ICD-10-CM | POA: Diagnosis present

## 2020-08-31 DIAGNOSIS — I110 Hypertensive heart disease with heart failure: Secondary | ICD-10-CM | POA: Diagnosis present

## 2020-08-31 DIAGNOSIS — E860 Dehydration: Secondary | ICD-10-CM | POA: Diagnosis present

## 2020-08-31 DIAGNOSIS — Z801 Family history of malignant neoplasm of trachea, bronchus and lung: Secondary | ICD-10-CM | POA: Diagnosis not present

## 2020-08-31 DIAGNOSIS — Z794 Long term (current) use of insulin: Secondary | ICD-10-CM | POA: Diagnosis not present

## 2020-08-31 DIAGNOSIS — Z79890 Hormone replacement therapy (postmenopausal): Secondary | ICD-10-CM | POA: Diagnosis not present

## 2020-08-31 DIAGNOSIS — N179 Acute kidney failure, unspecified: Secondary | ICD-10-CM | POA: Diagnosis not present

## 2020-08-31 DIAGNOSIS — Z20822 Contact with and (suspected) exposure to covid-19: Secondary | ICD-10-CM | POA: Diagnosis present

## 2020-08-31 DIAGNOSIS — I272 Pulmonary hypertension, unspecified: Secondary | ICD-10-CM | POA: Diagnosis present

## 2020-08-31 DIAGNOSIS — Z96642 Presence of left artificial hip joint: Secondary | ICD-10-CM | POA: Diagnosis present

## 2020-08-31 DIAGNOSIS — E039 Hypothyroidism, unspecified: Secondary | ICD-10-CM | POA: Diagnosis present

## 2020-08-31 DIAGNOSIS — D696 Thrombocytopenia, unspecified: Secondary | ICD-10-CM | POA: Diagnosis present

## 2020-08-31 DIAGNOSIS — K31819 Angiodysplasia of stomach and duodenum without bleeding: Secondary | ICD-10-CM | POA: Diagnosis present

## 2020-08-31 LAB — COMPREHENSIVE METABOLIC PANEL
ALT: 33 U/L (ref 0–44)
AST: 52 U/L — ABNORMAL HIGH (ref 15–41)
Albumin: 2.6 g/dL — ABNORMAL LOW (ref 3.5–5.0)
Alkaline Phosphatase: 111 U/L (ref 38–126)
Anion gap: 8 (ref 5–15)
BUN: 58 mg/dL — ABNORMAL HIGH (ref 8–23)
CO2: 21 mmol/L — ABNORMAL LOW (ref 22–32)
Calcium: 8.5 mg/dL — ABNORMAL LOW (ref 8.9–10.3)
Chloride: 103 mmol/L (ref 98–111)
Creatinine, Ser: 1.52 mg/dL — ABNORMAL HIGH (ref 0.44–1.00)
GFR, Estimated: 38 mL/min — ABNORMAL LOW (ref >60)
Glucose, Bld: 166 mg/dL — ABNORMAL HIGH (ref 70–99)
Potassium: 3.5 mmol/L (ref 3.5–5.1)
Sodium: 132 mmol/L — ABNORMAL LOW (ref 135–145)
Total Bilirubin: 3.5 mg/dL — ABNORMAL HIGH (ref 0.3–1.2)
Total Protein: 5.4 g/dL — ABNORMAL LOW (ref 6.5–8.1)

## 2020-08-31 LAB — GLUCOSE, CAPILLARY
Glucose-Capillary: 147 mg/dL — ABNORMAL HIGH (ref 70–99)
Glucose-Capillary: 219 mg/dL — ABNORMAL HIGH (ref 70–99)

## 2020-08-31 LAB — AMMONIA: Ammonia: 56 umol/L — ABNORMAL HIGH (ref 9–35)

## 2020-08-31 MED ORDER — BUMETANIDE 2 MG PO TABS: 2.0000 mg | ORAL_TABLET | Freq: Every day | ORAL | Status: AC

## 2020-08-31 MED ORDER — METOLAZONE 2.5 MG PO TABS: 2.5000 mg | ORAL_TABLET | ORAL | 2 refills | Status: AC | PRN

## 2020-08-31 MED ORDER — POTASSIUM CHLORIDE CRYS ER 20 MEQ PO TBCR: 40.0000 meq | EXTENDED_RELEASE_TABLET | Freq: Every day | ORAL | 2 refills | Status: AC

## 2020-08-31 MED ORDER — SPIRONOLACTONE 100 MG PO TABS: 100.0000 mg | ORAL_TABLET | Freq: Every day | ORAL | Status: AC

## 2020-08-31 NOTE — Discharge Summary (Signed)
Physician Discharge Summary  DANICIA TERHAAR XAJ:[id number] DOB: [birth year] DOA: 08/29/2020  PCP: Glenda Chroman, MD  Admit date: 08/29/2020 Discharge date: 08/31/2020  Admitted From:  SNF  Disposition:  SNF   Recommendations for Outpatient Follow-up:  1. Follow up with PCP in 2 weeks 2. Please check BMP in 1 week to follow renal function 3. Please hold diuretics until 12/20.    Discharge Condition: STABLE   CODE STATUS: FULL    Brief Hospitalization Summary: Please see all hospital notes, images, labs for full details of the hospitalization. ADMISSION HPI: [name] is a 64 y.o. female with medical history significant for nonalcoholic liver cirrhosis, esophageal varices and hepatic encephalopathy, GAVE, diabetes mellitus, coronary artery disease, pulmonary hypertension. Patient was brought to the ED from nursing home with reports of altered mental status and elevated ammonia levels 104 despite lactulose. On my evaluation patient answers a few questions, but history is limited from patient, spouse is at bedside and assist with the history.  Spouse last saw patient yesterday, reports patient is more slow to answer, and a bit confused compared to baseline.  Reports ammonia level was elevated, and fluids were given.  Also reports poor oral intake.  Spouse reports improvement in mental status after fluids given. Patient reports intermittent pain with urination, but no vomiting, no abdominal pain, no chest pain no difficulty breathing no cough. Patient had left hip arthroplasty a month ago- november, and was discharged to nursing home for rehab.  ED Course: Temperature 98.4.  Heart rate 15-21, blood pressure systolic 094-709.  O2 sats 93 -99% on room air.  Creatinine elevated 2.2, sodium 130, total bilirubin 4.8, ammonia level 49.  WBC 6.9.  10 units insulin,  1 L bolus and lactulose given, hospitalist to admit for acute kidney injury.  Hospital Course  Acute kidney injury -  prerenal, much improved now by holding diuretics and gently hydrating with IV fluids, renal function improving.  Pt stable to discharge back to SNF.  Hold diuretics until 12/20 and then resume. Recheck BMP in 1 week to follow up renal function.   Acute metabolic encephalopathy secondary to acute renal failure -resolved now.    Type 2 diabetes mellitus with hyperglycemia - restarted home treatment plan and recommending frequent CBG monitoring.   S/p left hip arthroplasty - post op care follow up with orthopedics  Nonalcoholic liver cirrhosis - resumed home lactulose and rifaximin.   Chronic thrombocytopenia -stable.   Chronic diastolic CHF - temporarily holding home diuretics due to AKI and dehydration.  Resume home diuretics 12/20.    DVT prophylaxis:  SCD Code Status:  Full  Family Communication:  Spouse  Disposition:  SNF   Discharge Diagnoses:   Principal Problem:   AKI (acute kidney injury) ([place]) Active Problems:   Cirrhosis, nonalcoholic ([place])   Diabetes mellitus ([place])   History of esophageal varices   Iron deficiency anemia   Chronic diastolic heart failure ([facility])   CAD (coronary artery disease)   GAVE (gastric antral vascular ectasia)   Metabolic encephalopathy   Discharge Instructions:  Allergies as of 08/31/2020      Reactions   Ferumoxytol Other (See Comments)   Patient has severe back and chest pain when receiving FERAHEME infusions.   Perflutren Lipid Microspheres    Other reaction(s): Other (See Comments) Pt experienced lower back spasm.    Tramadol Hcl Other (See Comments)   WEAKNESS      Medication List    TAKE these medications  bumetanide 2 MG tablet Commonly known as: BUMEX Take 1 tablet (2 mg total) by mouth daily. Start taking on: September 06, 2020 What changed:   when to take this  These instructions start on September 06, 2020. If you are unsure what to do until then, ask your doctor or other care provider.   busPIRone 10 MG  tablet Commonly known as: BUSPAR Take 10 mg by mouth 2 (two) times daily.   Cholecalciferol 25 MCG (1000 UT) tablet Take 1,000 Units by mouth every other day.   ferrous sulfate 324 MG Tbec Take 324 mg by mouth at bedtime.   HumaLOG 100 UNIT/ML injection Generic drug: insulin lispro Inject 15-35 Units into the skin 3 (three) times daily with meals. Sliding scale insulin 100-150= 15 units 150-200= 20 units 250-300= 30 units 300-350= 35 units   lactulose 10 GM/15ML solution Commonly known as: CHRONULAC Take 60 mLs by mouth in the morning, at noon, in the evening, and at bedtime. *If having watery stools more than 3 times daily, reduce to taking 3 times daily   Lantus SoloStar 100 UNIT/ML Solostar Pen Generic drug: insulin glargine Inject 60 Units into the skin daily.   levothyroxine 25 MCG tablet Commonly known as: SYNTHROID Take 25 mcg by mouth daily before breakfast.   lidocaine-prilocaine cream Commonly known as: EMLA Apply 1 application topically daily as needed (applied to port).   magnesium oxide 400 MG tablet Commonly known as: MAG-OX Take 400 mg by mouth at bedtime.   metolazone 2.5 MG tablet Commonly known as: ZAROXOLYN Take 1 tablet (2.5 mg total) by mouth every other day as needed. Start taking on: September 06, 2020 What changed: These instructions start on September 06, 2020. If you are unsure what to do until then, ask your doctor or other care provider.   potassium chloride SA 20 MEQ tablet Commonly known as: KLOR-CON Take 2 tablets (40 mEq total) by mouth daily. Start taking on: September 06, 2020 What changed: These instructions start on September 06, 2020. If you are unsure what to do until then, ask your doctor or other care provider.   rifaximin 550 MG Tabs tablet Commonly known as: XIFAXAN Take 1 tablet (550 mg total) by mouth 2 (two) times daily.   rosuvastatin 10 MG tablet Commonly known as: CRESTOR Take 10 mg by mouth daily.   sertraline 50  MG tablet Commonly known as: ZOLOFT Take 50 mg by mouth daily.   spironolactone 100 MG tablet Commonly known as: ALDACTONE Take 1 tablet (100 mg total) by mouth daily. Start taking on: September 06, 2020 What changed: These instructions start on September 06, 2020. If you are unsure what to do until then, ask your doctor or other care provider.   trimethoprim-polymyxin b ophthalmic solution Commonly known as: POLYTRIM Place 1 drop into the left eye See admin instructions. Use 3 to 4 times daily for 2 days following monthly eye injection   zinc sulfate 220 (50 Zn) MG capsule Take 1 capsule (220 mg total) by mouth daily.       Follow-up Information    [name], Dhruv B, MD. Schedule an appointment as soon as possible for a visit in 2 week(s).   Specialty: Internal Medicine Contact information: [place] [address] [phone]              Allergies  Allergen Reactions  . Ferumoxytol Other (See Comments)    Patient has severe back and chest pain when receiving FERAHEME infusions.   Marland Kitchen  Perflutren Lipid Microspheres     Other reaction(s): Other (See Comments) Pt experienced lower back spasm.   . Tramadol Hcl Other (See Comments)    WEAKNESS    Allergies as of 08/31/2020      Reactions   Ferumoxytol Other (See Comments)   Patient has severe back and chest pain when receiving FERAHEME infusions.   Perflutren Lipid Microspheres    Other reaction(s): Other (See Comments) Pt experienced lower back spasm.    Tramadol Hcl Other (See Comments)   WEAKNESS      Medication List    TAKE these medications   bumetanide 2 MG tablet Commonly known as: BUMEX Take 1 tablet (2 mg total) by mouth daily. Start taking on: September 06, 2020 What changed:   when to take this  These instructions start on September 06, 2020. If you are unsure what to do until then, ask your doctor or other care provider.   busPIRone 10 MG tablet Commonly known as: BUSPAR Take 10 mg by mouth  2 (two) times daily.   Cholecalciferol 25 MCG (1000 UT) tablet Take 1,000 Units by mouth every other day.   ferrous sulfate 324 MG Tbec Take 324 mg by mouth at bedtime.   HumaLOG 100 UNIT/ML injection Generic drug: insulin lispro Inject 15-35 Units into the skin 3 (three) times daily with meals. Sliding scale insulin 100-150= 15 units 150-200= 20 units 250-300= 30 units 300-350= 35 units   lactulose 10 GM/15ML solution Commonly known as: CHRONULAC Take 60 mLs by mouth in the morning, at noon, in the evening, and at bedtime. *If having watery stools more than 3 times daily, reduce to taking 3 times daily   Lantus SoloStar 100 UNIT/ML Solostar Pen Generic drug: insulin glargine Inject 60 Units into the skin daily.   levothyroxine 25 MCG tablet Commonly known as: SYNTHROID Take 25 mcg by mouth daily before breakfast.   lidocaine-prilocaine cream Commonly known as: EMLA Apply 1 application topically daily as needed (applied to port).   magnesium oxide 400 MG tablet Commonly known as: MAG-OX Take 400 mg by mouth at bedtime.   metolazone 2.5 MG tablet Commonly known as: ZAROXOLYN Take 1 tablet (2.5 mg total) by mouth every other day as needed. Start taking on: September 06, 2020 What changed: These instructions start on September 06, 2020. If you are unsure what to do until then, ask your doctor or other care provider.   potassium chloride SA 20 MEQ tablet Commonly known as: KLOR-CON Take 2 tablets (40 mEq total) by mouth daily. Start taking on: September 06, 2020 What changed: These instructions start on September 06, 2020. If you are unsure what to do until then, ask your doctor or other care provider.   rifaximin 550 MG Tabs tablet Commonly known as: XIFAXAN Take 1 tablet (550 mg total) by mouth 2 (two) times daily.   rosuvastatin 10 MG tablet Commonly known as: CRESTOR Take 10 mg by mouth daily.   sertraline 50 MG tablet Commonly known as: ZOLOFT Take 50 mg by  mouth daily.   spironolactone 100 MG tablet Commonly known as: ALDACTONE Take 1 tablet (100 mg total) by mouth daily. Start taking on: September 06, 2020 What changed: These instructions start on September 06, 2020. If you are unsure what to do until then, ask your doctor or other care provider.   trimethoprim-polymyxin b ophthalmic solution Commonly known as: POLYTRIM Place 1 drop into the left eye See admin instructions. Use 3 to 4 times daily for  2 days following monthly eye injection   zinc sulfate 220 (50 Zn) MG capsule Take 1 capsule (220 mg total) by mouth daily.      Procedures/Studies: CT HEAD WO CONTRAST  Result Date: 08/29/2020 CLINICAL DATA:  Altered mental status. Thrombocytopenia. EXAM: CT HEAD WITHOUT CONTRAST TECHNIQUE: Contiguous axial images were obtained from the base of the skull through the vertex without intravenous contrast. COMPARISON:  Head CT 08/27/2019 FINDINGS: Brain: Mild generalized atrophy, normal for age. No intracranial hemorrhage, mass effect, or midline shift. No hydrocephalus. The basilar cisterns are patent. No evidence of territorial infarct or acute ischemia. No extra-axial or intracranial fluid collection. Vascular: Atherosclerosis of skullbase vasculature without hyperdense vessel or abnormal calcification. Skull: No fracture or focal lesion. Sinuses/Orbits: Near completely resolved opacification of the left maxillary sinus with trace residual mucosal thickening. There is mild mucosal thickening involving scattered ethmoid air cells in the left frontal sinus. No mastoid effusion. Orbits are unremarkable. Other: None. IMPRESSION: No acute intracranial abnormality. Electronically Signed   By: Keith Rake M.D.   On: 08/29/2020 18:25   DG Hip Unilat W or Wo Pelvis 2-3 Views Left  Result Date: 08/29/2020 CLINICAL DATA:  Hip pain EXAM: DG HIP (WITH OR WITHOUT PELVIS) 2-3V LEFT COMPARISON:  None. FINDINGS: Status post left hip total arthroplasty. No  evidence of perihardware fracture or component malposition. Osteopenia. The included pelvis and proximal right femur are unremarkable. IMPRESSION: Status post left hip total arthroplasty. No evidence of perihardware fracture or component malposition. Electronically Signed   By: Eddie Candle M.D.   On: 08/29/2020 14:52      Subjective: Pt reports that she is feeling better today.  She is eating breakfast.    Discharge Exam: Vitals:   08/30/20 2055 08/31/20 0602  BP: (!) 116/37 (!) 105/44  Pulse: 89 83  Resp: 18 16  Temp: 98.7 F (37.1 C) 97.9 F (36.6 C)  SpO2: 98% 99%   Vitals:   08/30/20 0553 08/30/20 1419 08/30/20 2055 08/31/20 0602  BP: (!) 127/42 (!) 104/31 (!) 116/37 (!) 105/44  Pulse: 86 86 89 83  Resp: 20 18 18 16   Temp: 98.1 F (36.7 C) 98 F (36.7 C) 98.7 F (37.1 C) 97.9 F (36.6 C)  TempSrc: Oral Oral Oral   SpO2: 96% 98% 98% 99%  Weight:      Height:       General: Pt is alert, awake, not in acute distress Cardiovascular: normal S1/S2 +, no rubs, no gallops Respiratory: CTA bilaterally, no wheezing, no rhonchi Abdominal: Soft, NT, ND, bowel sounds + Extremities: no edema, no cyanosis   The results of significant diagnostics from this hospitalization (including imaging, microbiology, ancillary and laboratory) are listed below for reference.    Microbiology: Recent Results (from the past 240 hour(s))  Resp Panel by RT-PCR (Flu A&B, Covid) Nasopharyngeal Swab     Status: None   Collection Time: 08/29/20  1:52 PM   Specimen: Nasopharyngeal Swab; Nasopharyngeal(NP) swabs in vial transport medium  Result Value Ref Range Status   SARS Coronavirus 2 by RT PCR NEGATIVE NEGATIVE Final    Comment: (NOTE) SARS-CoV-2 target nucleic acids are NOT DETECTED.  The SARS-CoV-2 RNA is generally detectable in upper respiratory specimens during the acute phase of infection. The lowest concentration of SARS-CoV-2 viral copies this assay can detect is 138 copies/mL. A  negative result does not preclude SARS-Cov-2 infection and should not be used as the sole basis for treatment or other patient management decisions. A negative  result may occur with  improper specimen collection/handling, submission of specimen other than nasopharyngeal swab, presence of viral mutation(s) within the areas targeted by this assay, and inadequate number of viral copies(<138 copies/mL). A negative result must be combined with clinical observations, patient history, and epidemiological information. The expected result is Negative.  Fact Sheet for Patients:  EntrepreneurPulse.com.au  Fact Sheet for Healthcare Providers:  IncredibleEmployment.be  This test is no t yet approved or cleared by the [place] FDA and  has been authorized for detection and/or diagnosis of SARS-CoV-2 by FDA under an Emergency Use Authorization (EUA). This EUA will remain  in effect (meaning this test can be used) for the duration of the COVID-19 declaration under Section 564(b)(1) of the Act, 21 U.S.C.section 360bbb-3(b)(1), unless the authorization is terminated  or revoked sooner.       Influenza A by PCR NEGATIVE NEGATIVE Final   Influenza B by PCR NEGATIVE NEGATIVE Final    Comment: (NOTE) The Xpert Xpress SARS-CoV-2/FLU/RSV plus assay is intended as an aid in the diagnosis of influenza from Nasopharyngeal swab specimens and should not be used as a sole basis for treatment. Nasal washings and aspirates are unacceptable for Xpert Xpress SARS-CoV-2/FLU/RSV testing.  Fact Sheet for Patients: EntrepreneurPulse.com.au  Fact Sheet for Healthcare Providers: IncredibleEmployment.be  This test is not yet approved or cleared by the [place] FDA and has been authorized for detection and/or diagnosis of SARS-CoV-2 by FDA under an Emergency Use Authorization (EUA). This EUA will remain in effect (meaning this test can  be used) for the duration of the COVID-19 declaration under Section 564(b)(1) of the Act, 21 U.S.C. section 360bbb-3(b)(1), unless the authorization is terminated or revoked.  Performed at [facility], [address] [place], [place] [address]   MRSA PCR Screening     Status: None   Collection Time: 08/30/20 12:58 AM   Specimen: Nasal Mucosa; Nasopharyngeal  Result Value Ref Range Status   MRSA by PCR NEGATIVE NEGATIVE Final    Comment:        The GeneXpert MRSA Assay (FDA approved for NASAL specimens only), is one component of a comprehensive MRSA colonization surveillance program. It is not intended to diagnose MRSA infection nor to guide or monitor treatment for MRSA infections. Performed at [facility], [address], [place],  [address]   SARS Coronavirus 2 by RT PCR (hospital order, performed in [facility] hospital lab) Nasopharyngeal Nasopharyngeal Swab     Status: None   Collection Time: 08/30/20 10:15 AM   Specimen: Nasopharyngeal Swab  Result Value Ref Range Status   SARS Coronavirus 2 NEGATIVE NEGATIVE Final    Comment: (NOTE) SARS-CoV-2 target nucleic acids are NOT DETECTED.  The SARS-CoV-2 RNA is generally detectable in upper and lower respiratory specimens during the acute phase of infection. The lowest concentration of SARS-CoV-2 viral copies this assay can detect is 250 copies / mL. A negative result does not preclude SARS-CoV-2 infection and should not be used as the sole basis for treatment or other patient management decisions.  A negative result may occur with improper specimen collection / handling, submission of specimen other than nasopharyngeal swab, presence of viral mutation(s) within the areas targeted by this assay, and inadequate number of viral copies (<250 copies / mL). A negative result must be combined with clinical observations, patient history, and epidemiological information.  Fact Sheet for Patients:    StrictlyIdeas.no  Fact Sheet for Healthcare Providers: BankingDealers.co.za  This test is not yet approved or  cleared by the [place] and has been authorized for detection and/or diagnosis of SARS-CoV-2 by FDA under an Emergency Use Authorization (EUA).  This EUA will remain in effect (meaning this test can be used) for the duration of the COVID-19 declaration under Section 564(b)(1) of the Act, 21 U.S.C. section 360bbb-3(b)(1), unless the authorization is terminated or revoked sooner.  Performed at [facility], [address], [place], [place] [address]      Labs: BNP (last 3 results) Recent Labs    09/29/19 1718  BNP 35.5   Basic Metabolic Panel: Recent Labs  Lab 08/29/20 1312 08/30/20 0549 08/31/20 0634  NA 130* 131* 132*  K 4.2 3.5 3.5  CL 95* 101 103  CO2 24 22 21*  GLUCOSE 409* 235* 166*  BUN 76* 65* 58*  CREATININE 2.22* 1.65* 1.52*  CALCIUM 8.9 8.5* 8.5*   Liver Function Tests: Recent Labs  Lab 08/29/20 1312 08/30/20 0549 08/31/20 0634  AST 34 29 52*  ALT 29 26 33  ALKPHOS 119 100 111  BILITOT 4.8* 4.1* 3.5*  PROT 5.9* 5.1* 5.4*  ALBUMIN 2.8* 2.5* 2.6*   Recent Labs  Lab 08/29/20 1312  LIPASE 45   Recent Labs  Lab 08/29/20 1313 08/30/20 0832 08/31/20 0634  AMMONIA 49* 53* 56*   CBC: Recent Labs  Lab 08/29/20 1312 08/30/20 0549  WBC 6.9 5.3  NEUTROABS 4.6  --   HGB 9.8* 8.1*  HCT 30.8* 25.6*  MCV 96.9 97.0  PLT 66* 52*   Cardiac Enzymes: No results for input(s): CKTOTAL, CKMB, CKMBINDEX, TROPONINI in the last 168 hours. BNP: Invalid input(s): POCBNP CBG: Recent Labs  Lab 08/30/20 0751 08/30/20 1100 08/30/20 1640 08/30/20 2053 08/31/20 0756  GLUCAP 203* 292* 249* 217* 147*   D-Dimer No results for input(s): DDIMER in the last 72 hours. Hgb A1c No results for input(s): HGBA1C in the last 72 hours. Lipid Profile No results for input(s): CHOL, HDL, LDLCALC, TRIG,  CHOLHDL, LDLDIRECT in the last 72 hours. Thyroid function studies No results for input(s): TSH, T4TOTAL, T3FREE, THYROIDAB in the last 72 hours.  Invalid input(s): FREET3 Anemia work up No results for input(s): VITAMINB12, FOLATE, FERRITIN, TIBC, IRON, RETICCTPCT in the last 72 hours. Urinalysis    Component Value Date/Time   COLORURINE YELLOW 08/29/2020 1646   APPEARANCEUR CLEAR 08/29/2020 1646   LABSPEC 1.014 08/29/2020 1646   PHURINE 6.0 08/29/2020 1646   GLUCOSEU 50 (A) 08/29/2020 1646   HGBUR NEGATIVE 08/29/2020 1646   BILIRUBINUR NEGATIVE 08/29/2020 1646   KETONESUR NEGATIVE 08/29/2020 1646   PROTEINUR NEGATIVE 08/29/2020 1646   NITRITE NEGATIVE 08/29/2020 1646   LEUKOCYTESUR NEGATIVE 08/29/2020 1646   Sepsis Labs Invalid input(s): PROCALCITONIN,  WBC,  LACTICIDVEN Microbiology Recent Results (from the past 240 hour(s))  Resp Panel by RT-PCR (Flu A&B, Covid) Nasopharyngeal Swab     Status: None   Collection Time: 08/29/20  1:52 PM   Specimen: Nasopharyngeal Swab; Nasopharyngeal(NP) swabs in vial transport medium  Result Value Ref Range Status   SARS Coronavirus 2 by RT PCR NEGATIVE NEGATIVE Final    Comment: (NOTE) SARS-CoV-2 target nucleic acids are NOT DETECTED.  The SARS-CoV-2 RNA is generally detectable in upper respiratory specimens during the acute phase of infection. The lowest concentration of SARS-CoV-2 viral copies this assay can detect is 138 copies/mL. A negative result does not preclude SARS-Cov-2 infection and should not be used as the sole basis for treatment or other patient management decisions. A negative result may occur  with  improper specimen collection/handling, submission of specimen other than nasopharyngeal swab, presence of viral mutation(s) within the areas targeted by this assay, and inadequate number of viral copies(<138 copies/mL). A negative result must be combined with clinical observations, patient history, and  epidemiological information. The expected result is Negative.  Fact Sheet for Patients:  EntrepreneurPulse.com.au  Fact Sheet for Healthcare Providers:  IncredibleEmployment.be  This test is no t yet approved or cleared by the [place] FDA and  has been authorized for detection and/or diagnosis of SARS-CoV-2 by FDA under an Emergency Use Authorization (EUA). This EUA will remain  in effect (meaning this test can be used) for the duration of the COVID-19 declaration under Section 564(b)(1) of the Act, 21 U.S.C.section 360bbb-3(b)(1), unless the authorization is terminated  or revoked sooner.       Influenza A by PCR NEGATIVE NEGATIVE Final   Influenza B by PCR NEGATIVE NEGATIVE Final    Comment: (NOTE) The Xpert Xpress SARS-CoV-2/FLU/RSV plus assay is intended as an aid in the diagnosis of influenza from Nasopharyngeal swab specimens and should not be used as a sole basis for treatment. Nasal washings and aspirates are unacceptable for Xpert Xpress SARS-CoV-2/FLU/RSV testing.  Fact Sheet for Patients: EntrepreneurPulse.com.au  Fact Sheet for Healthcare Providers: IncredibleEmployment.be  This test is not yet approved or cleared by the [place] FDA and has been authorized for detection and/or diagnosis of SARS-CoV-2 by FDA under an Emergency Use Authorization (EUA). This EUA will remain in effect (meaning this test can be used) for the duration of the COVID-19 declaration under Section 564(b)(1) of the Act, 21 U.S.C. section 360bbb-3(b)(1), unless the authorization is terminated or revoked.  Performed at [facility], [address] [place], [place] [address]   MRSA PCR Screening     Status: None   Collection Time: 08/30/20 12:58 AM   Specimen: Nasal Mucosa; Nasopharyngeal  Result Value Ref Range Status   MRSA by PCR NEGATIVE NEGATIVE Final    Comment:        The GeneXpert MRSA Assay  (FDA approved for NASAL specimens only), is one component of a comprehensive MRSA colonization surveillance program. It is not intended to diagnose MRSA infection nor to guide or monitor treatment for MRSA infections. Performed at [facility], [address] [place], [place] [address]   SARS Coronavirus 2 by RT PCR (hospital order, performed in [facility] hospital lab) Nasopharyngeal Nasopharyngeal Swab     Status: None   Collection Time: 08/30/20 10:15 AM   Specimen: Nasopharyngeal Swab  Result Value Ref Range Status   SARS Coronavirus 2 NEGATIVE NEGATIVE Final    Comment: (NOTE) SARS-CoV-2 target nucleic acids are NOT DETECTED.  The SARS-CoV-2 RNA is generally detectable in upper and lower respiratory specimens during the acute phase of infection. The lowest concentration of SARS-CoV-2 viral copies this assay can detect is 250 copies / mL. A negative result does not preclude SARS-CoV-2 infection and should not be used as the sole basis for treatment or other patient management decisions.  A negative result may occur with improper specimen collection / handling, submission of specimen other than nasopharyngeal swab, presence of viral mutation(s) within the areas targeted by this assay, and inadequate number of viral copies (<250 copies / mL). A negative result must be combined with clinical observations, patient history, and epidemiological information.  Fact Sheet for Patients:   StrictlyIdeas.no  Fact Sheet for Healthcare Providers: BankingDealers.co.za  This test is not yet approved or  cleared by the  [place] Architectural technologist and has been authorized for detection and/or diagnosis of SARS-CoV-2 by FDA under an Print production planner (EUA).  This EUA will remain in effect (meaning this test can be used) for the duration of the COVID-19 declaration under Section 564(b)(1) of the Act, 21 U.S.C. section 360bbb-3(b)(1), unless the  authorization is terminated or revoked sooner.  Performed at [facility], [address], [place],  [address]    Time coordinating discharge:  35 mins   SIGNED:  Irwin Brakeman, MD  [facility] 08/31/2020, 10:09 AM How to contact the [facility] Attending or Consulting provider [place] or covering provider during after hours [place], for this patient?  1. Check the care team in [facility] and look for a) attending/consulting TRH provider listed and b) the [facility] team listed 2. Log into [email or web address] and use 's universal password to access. If you do not have the password, please contact the hospital operator. 3. Locate the [facility] provider you are looking for under [facility] and page to a number that you can be directly reached. 4. If you still have difficulty reaching the provider, please page the [facility] (Director on Call) for the Hospitalists listed on amion for assistance.

## 2020-08-31 NOTE — Progress Notes (Signed)
Discharge instructions given to patient and husband. All questions answered, all paperwork and belongings sent with patient. Await on ride at this time.

## 2020-08-31 NOTE — Plan of Care (Signed)
  Problem: Education: Goal: Knowledge of General Education information will improve Description: Including pain rating scale, medication(s)/side effects and non-pharmacologic comfort measures 08/31/2020 1437 by Cameron Ali, RN Outcome: Completed/Met 08/31/2020 0849 by Cameron Ali, RN Outcome: Progressing   Problem: Health Behavior/Discharge Planning: Goal: Ability to manage health-related needs will improve 08/31/2020 1437 by Cameron Ali, RN Outcome: Completed/Met 08/31/2020 0849 by Cameron Ali, RN Outcome: Progressing   Problem: Clinical Measurements: Goal: Ability to maintain clinical measurements within normal limits will improve 08/31/2020 1437 by Cameron Ali, RN Outcome: Completed/Met 08/31/2020 0849 by Cameron Ali, RN Outcome: Progressing Goal: Will remain free from infection 08/31/2020 1437 by Cameron Ali, RN Outcome: Completed/Met 08/31/2020 0849 by Cameron Ali, RN Outcome: Progressing Goal: Diagnostic test results will improve 08/31/2020 1437 by Cameron Ali, RN Outcome: Completed/Met 08/31/2020 0849 by Cameron Ali, RN Outcome: Progressing Goal: Respiratory complications will improve 08/31/2020 1437 by Cameron Ali, RN Outcome: Completed/Met 08/31/2020 0849 by Cameron Ali, RN Outcome: Progressing Goal: Cardiovascular complication will be avoided 08/31/2020 1437 by Cameron Ali, RN Outcome: Completed/Met 08/31/2020 0849 by Cameron Ali, RN Outcome: Progressing   Problem: Activity: Goal: Risk for activity intolerance will decrease 08/31/2020 1437 by Cameron Ali, RN Outcome: Completed/Met 08/31/2020 0849 by Cameron Ali, RN Outcome: Progressing   Problem: Nutrition: Goal: Adequate nutrition will be maintained 08/31/2020 1437 by Cameron Ali, RN Outcome: Completed/Met 08/31/2020 0849 by Cameron Ali, RN Outcome: Progressing   Problem: Coping: Goal: Level  of anxiety will decrease 08/31/2020 1437 by Cameron Ali, RN Outcome: Completed/Met 08/31/2020 0849 by Cameron Ali, RN Outcome: Progressing   Problem: Elimination: Goal: Will not experience complications related to bowel motility 08/31/2020 1437 by Cameron Ali, RN Outcome: Completed/Met 08/31/2020 0849 by Cameron Ali, RN Outcome: Progressing Goal: Will not experience complications related to urinary retention 08/31/2020 1437 by Cameron Ali, RN Outcome: Completed/Met 08/31/2020 0849 by Cameron Ali, RN Outcome: Progressing   Problem: Pain Managment: Goal: General experience of comfort will improve 08/31/2020 1437 by Cameron Ali, RN Outcome: Completed/Met 08/31/2020 0849 by Cameron Ali, RN Outcome: Progressing   Problem: Safety: Goal: Ability to remain free from injury will improve 08/31/2020 1437 by Cameron Ali, RN Outcome: Completed/Met 08/31/2020 0849 by Cameron Ali, RN Outcome: Progressing   Problem: Skin Integrity: Goal: Risk for impaired skin integrity will decrease 08/31/2020 1437 by Cameron Ali, RN Outcome: Completed/Met 08/31/2020 0849 by Cameron Ali, RN Outcome: Progressing

## 2020-08-31 NOTE — Plan of Care (Signed)

## 2020-08-31 NOTE — Plan of Care (Signed)
  Problem: Acute Rehab PT Goals(only PT should resolve) Goal: Pt Will Go Supine/Side To Sit Outcome: Progressing Flowsheets (Taken 08/31/2020 0937) Pt will go Supine/Side to Sit:  with modified independence  with supervision Goal: Patient Will Transfer Sit To/From Stand Outcome: Progressing Flowsheets (Taken 08/31/2020 0937) Patient will transfer sit to/from stand:  with min guard assist  with supervision Goal: Pt Will Transfer Bed To Chair/Chair To Bed Outcome: Progressing Flowsheets (Taken 08/31/2020 0937) Pt will Transfer Bed to Chair/Chair to Bed:  with supervision  min guard assist Goal: Pt Will Ambulate Outcome: Progressing Flowsheets (Taken 08/31/2020 0937) Pt will Ambulate:  75 feet  with minimal assist  with min guard assist  with rolling walker   9:38 AM, 08/31/20 Lonell Grandchild, MPT Physical Therapist with [facility] [phone] office [phone] mobile phone

## 2020-08-31 NOTE — TOC Transition Note (Addendum)
Transition of Care [facility]) - CM/SW Discharge Note   Patient Details  Name: [name] MRN: [id number] Date of Birth: [birth year]  Transition [name]) CM/SW Contact:  Boneta Lucks, RN Phone Number: 08/31/2020, 3:18 PM   Clinical Narrative:  [name] spoke with [name] - Husband, he is taking patient home. He states that patient was to be discharged home from [facility] on the 16th with service from [facility]. TOC has left messages with both companies to confirm they have the referral and orders.   Addendum: [facility] is not taking [facility] at this time. TOC reached out to [place] they can not accept. Vaughan Basta with [facility] will accept and see patient Thursday HHPT/RN. Patient has been discharged, left a message on home phone to update husband.  Confirmed that [facility] palliative will see patient as soon as possible.    Final next level of care: [place] Barriers to Discharge: Barriers Resolved  Patient Goals and CMS Choice Patient states their goals for this hospitalization and ongoing recovery are:: to go home. CMS Medicare.gov Compare Post Acute Care list provided to:: Patient Represenative (must comment) Choice offered to / list presented to : Spouse  Discharge Placement             Patient chooses bed at:  (home) Patient to be transferred to facility by: [name] - husband Name of family member notified: [name] Patient and family notified of of transfer: 08/31/20  Discharge Plan and Services      HH Arranged: RN,PT   Date [place]: 08/31/20 Time [place]: 1518 Representative spoke with at [place]: Joelene Millin - text   Readmission Risk Interventions Readmission Risk Prevention Plan 10/05/2019 09/01/2019 08/29/2019  Transportation Screening Complete - Complete  PCP or Specialist Appt within 3-5 Days Complete Complete -  HRI or Home Care Consult Not Complete Complete -  [place] or Home Care Consult comments Pt declined - -   Social Work Consult for [place] Planning/Counseling Not Complete Complete -  SW consult not completed comments N/A - -  Palliative Care Screening Not Complete Not Applicable -  Medication Review Press photographer) Complete - Complete  PCP or Specialist appointment within 3-5 days of discharge - - Not Complete  HRI or [place] - - Complete  SW Recovery Care/Counseling Consult - - Complete  Palliative Care Screening - - Not Complete  Skilled Nursing Facility - - Not Complete  Some recent data might be hidden

## 2020-08-31 NOTE — Progress Notes (Signed)
Palliative Medicine RN Note: Note PMT consult for Maria [name], but discharge summary is already in. If pt is about to leave and Palliative Care is needed as an outpatient, please include this in the d/c summary and orders, and notify the [facility] via TOC order so that they may make a referral to an outpatient palliative provider. Otherwise, the [facility] PMT will see Maria [name] as soon as possible.  Marjie Skiff Adiana Smelcer, RN, BSN, [facility] Palliative Medicine Team 08/31/2020 1:53 PM Office [phone]

## 2020-08-31 NOTE — TOC Progression Note (Signed)
Transition of Care [facility]) - Progression Note    Patient Details  Name: Maria [name] MRN: [id number] Date of Birth: [birth year]  [facility]) CM/SW Contact  Boneta Lucks, RN Phone Number: 08/31/2020, 1:05 PM  Clinical Narrative:     Dr. Amalia Hailey with [facility] is going to deny another SNF authorization at this time. He would be willing to approve a couple days justo to quickly facilitate the transition. but before he'd agree to do that he would like to see a palliative consult or at least a realistic conversation with the husband that this pt is not rehab candidate and is custodial. They would want a plan from the husband on what custodial care will looks like, at home vs paying OOP at a facility.   MD and Palliative updated.     Expected Discharge Plan: Skilled Nursing Facility Barriers to Discharge: Barriers Unresolved (comment)  Expected Discharge Plan and Services Expected Discharge Plan: [place]    Expected Discharge Date: 08/31/20                   Readmission Risk Interventions Readmission Risk Prevention Plan 10/05/2019 09/01/2019 08/29/2019  Transportation Screening Complete - Complete  PCP or Specialist Appt within 3-5 Days Complete Complete -  HRI or Home Care Consult Not Complete Complete -  HRI or Home Care Consult comments Pt declined - -  Social Work Consult for [place] Planning/Counseling Not Complete Complete -  SW consult not completed comments N/A - -  Palliative Care Screening Not Complete Not Applicable -  Medication Review Press photographer) Complete - Complete  PCP or Specialist appointment within 3-5 days of discharge - - Not Complete  HRI or [place] - - Complete  SW Recovery Care/Counseling Consult - - Complete  Palliative Care Screening - - Not Complete  [place] - - Not Complete  Some recent data might be hidden

## 2020-08-31 NOTE — Evaluation (Signed)
Physical Therapy Evaluation Patient Details Name: Maria [name] MRN: [id number] DOB: 04-Apr-1956 Today's Date: 08/31/2020   History of Present Illness  Maria [name] is a 64 y.o. female with medical history significant for nonalcoholic liver cirrhosis, esophageal varices and hepatic encephalopathy, GAVE, diabetes mellitus, coronary artery disease, pulmonary hypertension.  Patient was brought to the ED from nursing home with reports of altered mental status and elevated ammonia levels 104 despite lactulose.  On my evaluation patient answers a few questions, but history is limited from patient, spouse is at bedside and assist with the history.  Spouse last saw patient yesterday, reports patient is more slow to answer, and a bit confused compared to baseline.  Reports ammonia level was elevated, and fluids were given.  Also reports poor oral intake.  Spouse reports improvement in mental status after fluids given.  Patient reports intermittent pain with urination, but no vomiting, no abdominal pain, no chest pain no difficulty breathing no cough.  Patient had left hip arthroplasty a month ago- november, and was discharged to nursing home for rehab.    Clinical Impression  Patient demonstrates good return for sitting up at bedside, labored slow cadence without loss of balance during ambulation, requires increased time and frequent verbal/tactile cueing when making turns using RW, limited mostly due to fatigue and tolerated sitting up in chair after therapy.  Patient will benefit from continued physical therapy in hospital and recommended venue below to increase strength, balance, endurance for safe ADLs and gait.    Follow Up Recommendations SNF    Equipment Recommendations  None recommended by PT    Recommendations for Other Services       Precautions / Restrictions Precautions Precautions: Fall Restrictions Weight Bearing Restrictions: Yes LLE Weight Bearing: Weight bearing as tolerated       Mobility  Bed Mobility Overal bed mobility: Needs Assistance Bed Mobility: Supine to Sit     Supine to sit: Supervision;Min guard     General bed mobility comments: slightly increased time, labored movement    Transfers Overall transfer level: Needs assistance Equipment used: Rolling walker (2 wheeled) Transfers: Sit to/from [facility] Sit to Stand: Min guard;Min assist Stand pivot transfers: Min assist       General transfer comment: increased time, labored movement  Ambulation/Gait Ambulation/Gait assistance: Min assist Gait Distance (Feet): 45 Feet Assistive device: Rolling walker (2 wheeled) Gait Pattern/deviations: Decreased step length - left;Decreased stride length;Decreased stance time - left Gait velocity: decreased   General Gait Details: slow labored cadence without loss of blalance, requires increased time to make turns and limited mostly due to fatigue  Stairs            Wheelchair Mobility    Modified Rankin (Stroke Patients Only)       Balance Overall balance assessment: Needs assistance Sitting-balance support: Feet supported;No upper extremity supported Sitting balance-Leahy Scale: Good Sitting balance - Comments: seated at EOB   Standing balance support: During functional activity;Bilateral upper extremity supported Standing balance-Leahy Scale: Fair Standing balance comment: using RW                             Pertinent Vitals/Pain Pain Assessment: No/denies pain    Home Living Family/patient expects to be discharged to:: Private residence Living Arrangements: Spouse/significant other Available Help at Discharge: Family;Available PRN/intermittently Type of Home: House Home Access: Stairs to enter Entrance Stairs-Rails: Right;Left;Can reach both Entrance Stairs-Number of Steps: 2 Home Layout:  One level Home Equipment: [place] - 2 wheels;Bedside commode      Prior Function Level of  Independence: Independent with assistive device(s)         Comments: household ambulator using RW     Hand Dominance        Extremity/Trunk Assessment   Upper Extremity Assessment Upper Extremity Assessment: Generalized weakness    Lower Extremity Assessment Lower Extremity Assessment: Generalized weakness    Cervical / Trunk Assessment Cervical / Trunk Assessment: Normal  Communication   Communication: No difficulties  Cognition Arousal/Alertness: Awake/alert Behavior During Therapy: WFL for tasks assessed/performed Overall Cognitive Status: Within Functional Limits for tasks assessed                                        General Comments      Exercises     Assessment/Plan    PT Assessment Patient needs continued PT services  PT Problem List Decreased strength;Decreased activity tolerance;Decreased balance;Decreased mobility       PT Treatment Interventions Balance training;Gait training;Stair training;Functional mobility training;Therapeutic activities;Therapeutic exercise;Patient/family education;DME instruction    PT Goals (Current goals can be found in the Care Plan section)  Acute Rehab PT Goals Patient Stated Goal: return home after rehab PT Goal Formulation: With patient Time For Goal Achievement: 09/14/20 Potential to Achieve Goals: Good    Frequency Min 3X/week   Barriers to discharge        Co-evaluation               AM-PAC PT "6 Clicks" Mobility  Outcome Measure Help needed turning from your back to your side while in a flat bed without using bedrails?: A Little Help needed moving from lying on your back to sitting on the side of a flat bed without using bedrails?: A Little Help needed moving to and from a bed to a chair (including a wheelchair)?: A Little Help needed standing up from a chair using your arms (e.g., wheelchair or bedside chair)?: A Little Help needed to walk in hospital room?: A Little Help  needed climbing 3-5 steps with a railing? : A Lot 6 Click Score: 17    End of Session   Activity Tolerance: Patient tolerated treatment well;Patient limited by fatigue Patient left: in chair;with call bell/phone within reach;with chair alarm set Nurse Communication: Mobility status PT Visit Diagnosis: Unsteadiness on feet (R26.81);Other abnormalities of gait and mobility (R26.89);Muscle weakness (generalized) (M62.81)    Time: 6759-1638 PT Time Calculation (min) (ACUTE ONLY): 31 min   Charges:   PT Evaluation $PT Eval Moderate Complexity: 1 Mod PT Treatments $Therapeutic Activity: 23-37 mins        9:35 AM, 08/31/20 Lonell Grandchild, MPT Physical Therapist with [facility] 336 [phone] office [phone] mobile phone

## 2020-08-31 NOTE — Discharge Instructions (Signed)
Acute Kidney Injury, Adult  Acute kidney injury is a sudden worsening of kidney function. The kidneys are organs that have several jobs. They filter the blood to remove waste products and extra fluid. They also maintain a healthy balance of minerals and hormones in the body, which helps control blood pressure and keep bones strong. With this condition, your kidneys do not do their jobs as well as they should. This condition ranges from mild to severe. Over time it may develop into long-lasting (chronic) kidney disease. Early detection and treatment may prevent acute kidney injury from developing into a chronic condition. What are the causes? Common causes of this condition include:  A problem with blood flow to the kidneys. This may be caused by: ? Low blood pressure (hypotension) or shock. ? Blood loss. ? Heart and blood vessel (cardiovascular) disease. ? Severe burns. ? Liver disease.  Direct damage to the kidneys. This may be caused by: ? Certain medicines. ? A kidney infection. ? Poisoning. ? Being around or in contact with toxic substances. ? A surgical wound. ? A hard, direct hit to the kidney area.  A sudden blockage of urine flow. This may be caused by: ? Cancer. ? Kidney stones. ? An enlarged prostate in males. What are the signs or symptoms? Symptoms of this condition may not be obvious until the condition becomes severe. Symptoms of this condition can include:  Tiredness (lethargy), or difficulty staying awake.  Nausea or vomiting.  Swelling (edema) of the face, legs, ankles, or feet.  Problems with urination, such as: ? Abdominal pain, or pain along the side of your stomach (flank). ? Decreased urine production. ? Decrease in the force of urine flow.  Muscle twitches and cramps, especially in the legs.  Confusion or trouble concentrating.  Loss of appetite.  Fever. How is this diagnosed? This condition may be diagnosed with tests, including:  Blood  tests.  Urine tests.  Imaging tests.  A test in which a sample of tissue is removed from the kidneys to be examined under a microscope (kidney biopsy). How is this treated? Treatment for this condition depends on the cause and how severe the condition is. In mild cases, treatment may not be needed. The kidneys may heal on their own. In more severe cases, treatment will involve:  Treating the cause of the kidney injury. This may involve changing any medicines you are taking or adjusting your dosage.  Fluids. You may need specialized IV fluids to balance your body's needs.  Having a catheter placed to drain urine and prevent blockages.  Preventing problems from occurring. This may mean avoiding certain medicines or procedures that can cause further injury to the kidneys. In some cases treatment may also require:  A procedure to remove toxic wastes from the body (dialysis or continuous renal replacement therapy - CRRT).  Surgery. This may be done to repair a torn kidney, or to remove the blockage from the urinary system. Follow these instructions at home: Medicines  Take over-the-counter and prescription medicines only as told by your health care provider.  Do not take any new medicines without your health care provider's approval. Many medicines can worsen your kidney damage.  Do not take any vitamin and mineral supplements without your health care provider's approval. Many nutritional supplements can worsen your kidney damage. Lifestyle  If your health care provider prescribed changes to your diet, follow them. You may need to decrease the amount of protein you eat.  Achieve and maintain a healthy  weight. If you need help with this, ask your health care provider.  Start or continue an exercise plan. Try to exercise at least 30 minutes a day, 5 days a week.  Do not use any tobacco products, such as cigarettes, chewing tobacco, and e-cigarettes. If you need help quitting, ask your  health care provider. General instructions  Keep track of your blood pressure. Report changes in your blood pressure as told by your health care provider.  Stay up to date with immunizations. Ask your health care provider which immunizations you need.  Keep all follow-up visits as told by your health care provider. This is important. Where to find more information  American Association of Kidney Patients: BombTimer.gl  National Kidney Foundation: www.kidney.Welcome: https://mathis.com/  Life Options Rehabilitation Program: ? www.lifeoptions.org ? www.kidneyschool.org Contact a health care provider if:  Your symptoms get worse.  You develop new symptoms. Get help right away if:  You develop symptoms of worsening kidney disease, which include: ? Headaches. ? Abnormally dark or light skin. ? Easy bruising. ? Frequent hiccups. ? Chest pain. ? Shortness of breath. ? End of menstruation in women. ? Seizures. ? Confusion or altered mental status. ? Abdominal or back pain. ? Itchiness.  You have a fever.  Your body is producing less urine.  You have pain or bleeding when you urinate. Summary  Acute kidney injury is a sudden worsening of kidney function.  Acute kidney injury can be caused by problems with blood flow to the kidneys, direct damage to the kidneys, and sudden blockage of urine flow.  Symptoms of this condition may not be obvious until it becomes severe. Symptoms may include edema, lethargy, confusion, nausea or vomiting, and problems passing urine.  This condition can usually be diagnosed with blood tests, urine tests, and imaging tests. Sometimes a kidney biopsy is done to diagnose this condition.  Treatment for this condition often involves treating the underlying cause. It is treated with fluids, medicines, dialysis, diet changes, or surgery. This information is not intended to replace advice given to you by your health care provider. Make  sure you discuss any questions you have with your health care provider. Document Revised: 08/17/2017 Document Reviewed: 08/25/2016 Elsevier Patient Education  [place].   IMPORTANT INFORMATION: PAY CLOSE ATTENTION   PHYSICIAN DISCHARGE INSTRUCTIONS  Follow with Primary care provider  Glenda Chroman, MD  and other consultants as instructed by your Hospitalist Physician  [facility] IF SYMPTOMS COME BACK, WORSEN OR NEW PROBLEM DEVELOPS   Please note: You were cared for by a hospitalist during your hospital stay. Every effort will be made to forward records to your primary care provider.  You can request that your primary care provider send for your hospital records if they have not received them.  Once you are discharged, your primary care physician will handle any further medical issues. Please note that NO REFILLS for any discharge medications will be authorized once you are discharged, as it is imperative that you return to your primary care physician (or establish a relationship with a primary care physician if you do not have one) for your post hospital discharge needs so that they can reassess your need for medications and monitor your lab values.  Please get a complete blood count and chemistry panel checked by your Primary MD at your next visit, and again as instructed by your Primary MD.  Get Medicines reviewed and adjusted: Please  take all your medications with you for your next visit with your Primary MD  Laboratory/radiological data: Please request your Primary MD to go over all hospital tests and procedure/radiological results at the follow up, please ask your primary care provider to get all Hospital records sent to his/her office.  In some cases, they will be blood work, cultures and biopsy results pending at the time of your discharge. Please request that your primary care provider follow up on these results.  If you are diabetic,  please bring your blood sugar readings with you to your follow up appointment with primary care.    Please call and make your follow up appointments as soon as possible.    Also Note the following: If you experience worsening of your admission symptoms, develop shortness of breath, life threatening emergency, suicidal or homicidal thoughts you must seek medical attention immediately by calling 911 or calling your MD immediately  if symptoms less severe.  You must read complete instructions/literature along with all the possible adverse reactions/side effects for all the Medicines you take and that have been prescribed to you. Take any new Medicines after you have completely understood and accpet all the possible adverse reactions/side effects.   Do not drive when taking Pain medications or sleeping medications (Benzodiazepines)  Do not take more than prescribed Pain, Sleep and Anxiety Medications. It is not advisable to combine anxiety,sleep and pain medications without talking with your primary care practitioner  Special Instructions: If you have smoked or chewed Tobacco  in the last 2 yrs please stop smoking, stop any regular Alcohol  and or any Recreational drug use.  Wear Seat belts while driving.  Do not drive if taking any narcotic, mind altering or controlled substances or recreational drugs or alcohol.

## 2020-09-06 ENCOUNTER — Encounter (HOSPITAL_COMMUNITY): Payer: Self-pay

## 2020-09-06 ENCOUNTER — Other Ambulatory Visit: Payer: Self-pay

## 2020-09-06 ENCOUNTER — Encounter (HOSPITAL_COMMUNITY)
Admission: RE | Admit: 2020-09-06 | Discharge: 2020-09-06 | Disposition: A | Discharge status: Home / Self Care | Payer: PPO | Source: Ambulatory Visit | Attending: Internal Medicine | Admitting: Internal Medicine

## 2020-09-06 DIAGNOSIS — K7581 Nonalcoholic steatohepatitis (NASH): Secondary | ICD-10-CM | POA: Diagnosis not present

## 2020-09-06 LAB — SAMPLE TO BLOOD BANK

## 2020-09-06 LAB — HEMOGLOBIN AND HEMATOCRIT, BLOOD
HCT: 26.5 % — ABNORMAL LOW (ref 36.0–46.0)
Hemoglobin: 8.2 g/dL — ABNORMAL LOW (ref 12.0–15.0)

## 2020-09-06 MED ORDER — HEPARIN SOD (PORK) LOCK FLUSH 100 UNIT/ML IV SOLN
500.0000 [IU] | Freq: Once | INTRAVENOUS | Status: AC
  Administered 2020-09-06: 13:00:00 500 [IU] via INTRAVENOUS

## 2020-09-09 DIAGNOSIS — K7210 Chronic hepatic failure without coma: Secondary | ICD-10-CM | POA: Diagnosis not present

## 2020-09-09 DIAGNOSIS — K7290 Hepatic failure, unspecified without coma: Secondary | ICD-10-CM | POA: Diagnosis not present

## 2020-09-09 DIAGNOSIS — E1165 Type 2 diabetes mellitus with hyperglycemia: Secondary | ICD-10-CM | POA: Diagnosis not present

## 2020-09-09 DIAGNOSIS — Z299 Encounter for prophylactic measures, unspecified: Secondary | ICD-10-CM | POA: Diagnosis not present

## 2020-09-09 DIAGNOSIS — I8500 Esophageal varices without bleeding: Secondary | ICD-10-CM | POA: Diagnosis not present

## 2020-09-09 DIAGNOSIS — I10 Essential (primary) hypertension: Secondary | ICD-10-CM | POA: Diagnosis not present

## 2020-09-14 ENCOUNTER — Encounter (HOSPITAL_COMMUNITY): Payer: Self-pay

## 2020-09-14 ENCOUNTER — Encounter (HOSPITAL_COMMUNITY)
Admission: RE | Admit: 2020-09-14 | Discharge: 2020-09-14 | Disposition: A | Discharge status: Home / Self Care | Payer: PPO | Source: Ambulatory Visit | Attending: Internal Medicine | Admitting: Internal Medicine

## 2020-09-14 ENCOUNTER — Other Ambulatory Visit: Payer: Self-pay

## 2020-09-14 DIAGNOSIS — K7581 Nonalcoholic steatohepatitis (NASH): Secondary | ICD-10-CM | POA: Diagnosis not present

## 2020-09-14 LAB — HEMOGLOBIN AND HEMATOCRIT, BLOOD
HCT: 23.8 % — ABNORMAL LOW (ref 36.0–46.0)
Hemoglobin: 7 g/dL — ABNORMAL LOW (ref 12.0–15.0)

## 2020-09-14 LAB — AMMONIA: Ammonia: 97 umol/L — ABNORMAL HIGH (ref 9–35)

## 2020-09-14 LAB — SAMPLE TO BLOOD BANK

## 2020-09-14 MED ORDER — HEPARIN SOD (PORK) LOCK FLUSH 100 UNIT/ML IV SOLN
500.0000 [IU] | Freq: Once | INTRAVENOUS | Status: AC
  Administered 2020-09-14: 13:00:00 500 [IU] via INTRAVENOUS

## 2020-09-15 ENCOUNTER — Encounter (HOSPITAL_COMMUNITY): Payer: Self-pay

## 2020-09-15 ENCOUNTER — Encounter (HOSPITAL_COMMUNITY)
Admission: RE | Admit: 2020-09-15 | Discharge: 2020-09-15 | Disposition: A | Discharge status: Home / Self Care | Payer: PPO | Source: Ambulatory Visit | Attending: Internal Medicine | Admitting: Internal Medicine

## 2020-09-15 DIAGNOSIS — K7581 Nonalcoholic steatohepatitis (NASH): Secondary | ICD-10-CM | POA: Diagnosis not present

## 2020-09-15 MED ORDER — DIPHENHYDRAMINE HCL 50 MG/ML IJ SOLN
12.5000 mg | Freq: Once | INTRAMUSCULAR | Status: AC
  Administered 2020-09-15: 09:00:00 12.5 mg via INTRAVENOUS

## 2020-09-15 MED ORDER — ACETAMINOPHEN 325 MG PO TABS
650.0000 mg | ORAL_TABLET | Freq: Once | ORAL | Status: AC
  Administered 2020-09-15: 09:00:00 650 mg via ORAL

## 2020-09-15 MED ORDER — SODIUM CHLORIDE 0.9% IV SOLUTION: Freq: Once | INTRAVENOUS | Status: AC

## 2020-09-15 MED ORDER — HEPARIN SOD (PORK) LOCK FLUSH 100 UNIT/ML IV SOLN
500.0000 [IU] | Freq: Once | INTRAVENOUS | Status: AC
  Administered 2020-09-15: 13:00:00 500 [IU] via INTRAVENOUS
  Filled 2020-09-15: qty 5

## 2020-09-15 MED ORDER — SERTRALINE 50 MG TABLET
ORAL_TABLET | 0 refills | 0 days | Status: CP
Start: 2020-09-15 — End: ?

## 2020-09-15 NOTE — Unmapped (Signed)
Patient has requested a medication refill via EPIC

## 2020-09-16 DIAGNOSIS — Z515 Encounter for palliative care: Secondary | ICD-10-CM | POA: Diagnosis not present

## 2020-09-16 DIAGNOSIS — K7581 Nonalcoholic steatohepatitis (NASH): Secondary | ICD-10-CM | POA: Diagnosis not present

## 2020-09-16 LAB — BPAM RBC
Blood Product Expiration Date: 202201272359
Blood Product Expiration Date: 202201312359
ISSUE DATE / TIME: 202112290902
ISSUE DATE / TIME: 202112291113
Unit Type and Rh: 9500
Unit Type and Rh: 9500

## 2020-09-16 LAB — TYPE AND SCREEN
ABO/RH(D): O NEG
Antibody Screen: NEGATIVE
Unit division: 0
Unit division: 0

## 2020-09-16 MED ORDER — FERROUS SULFATE 325 MG (65 MG IRON) TABLET
ORAL_TABLET | Freq: Every day | ORAL | 3 refills | 30 days | Status: CP
Start: 2020-09-16 — End: 2021-09-16

## 2020-09-21 ENCOUNTER — Other Ambulatory Visit: Payer: Self-pay

## 2020-09-21 ENCOUNTER — Encounter (HOSPITAL_COMMUNITY)
Admission: RE | Admit: 2020-09-21 | Discharge: 2020-09-21 | Disposition: A | Discharge status: Home / Self Care | Payer: PPO | Source: Ambulatory Visit | Attending: Internal Medicine | Admitting: Internal Medicine

## 2020-09-21 DIAGNOSIS — K7581 Nonalcoholic steatohepatitis (NASH): Secondary | ICD-10-CM | POA: Insufficient documentation

## 2020-09-21 LAB — HEMOGLOBIN AND HEMATOCRIT, BLOOD
HCT: 34.3 % — ABNORMAL LOW (ref 36.0–46.0)
Hemoglobin: 10.9 g/dL — ABNORMAL LOW (ref 12.0–15.0)

## 2020-09-21 LAB — AMMONIA: Ammonia: 78 umol/L — ABNORMAL HIGH (ref 9–35)

## 2020-09-21 LAB — SAMPLE TO BLOOD BANK

## 2020-09-21 MED ORDER — HEPARIN SOD (PORK) LOCK FLUSH 100 UNIT/ML IV SOLN
500.0000 [IU] | Freq: Once | INTRAVENOUS | Status: AC
  Administered 2020-09-21: 500 [IU] via INTRAVENOUS

## 2020-09-22 DIAGNOSIS — K7210 Chronic hepatic failure without coma: Principal | ICD-10-CM

## 2020-09-22 NOTE — Unmapped (Signed)
Pts husband, Casimiro Needle, called to report she has recovered from a recent hip fracture in Nov and would like her to be reconsidered for a liver and come back to treatment at [facility] now that she is stable.  TNC placed orders for [facility] and will get pt scheduled for clinic appt. Pts husband in agreement with POC. Provided contact info and introduced self as pt's new TNC.

## 2020-09-26 MED ORDER — BUMETANIDE 2 MG TABLET
ORAL_TABLET | 0 refills | 0 days
Start: 2020-09-26 — End: ?

## 2020-09-27 ENCOUNTER — Encounter (HOSPITAL_COMMUNITY): Admission: RE | Admit: 2020-09-27 | Payer: PPO | Source: Ambulatory Visit

## 2020-09-27 MED ORDER — BUMETANIDE 2 MG TABLET
ORAL_TABLET | 0 refills | 0 days | Status: CP
Start: 2020-09-27 — End: 2020-10-25

## 2020-09-27 NOTE — Unmapped (Signed)
Patient has requested a medication refill via EPIC

## 2020-09-30 DIAGNOSIS — Z7682 Awaiting organ transplant status: Principal | ICD-10-CM

## 2020-09-30 DIAGNOSIS — K7210 Chronic hepatic failure without coma: Principal | ICD-10-CM

## 2020-09-30 NOTE — Unmapped (Signed)
[facility] orders placed.

## 2020-10-04 ENCOUNTER — Encounter (HOSPITAL_COMMUNITY): Admission: RE | Admit: 2020-10-04 | Payer: PPO | Source: Ambulatory Visit

## 2020-10-06 ENCOUNTER — Other Ambulatory Visit: Payer: Self-pay

## 2020-10-06 ENCOUNTER — Encounter (HOSPITAL_COMMUNITY)
Admission: RE | Admit: 2020-10-06 | Discharge: 2020-10-06 | Disposition: A | Discharge status: Home / Self Care | Payer: PPO | Source: Ambulatory Visit | Attending: Internal Medicine | Admitting: Internal Medicine

## 2020-10-06 DIAGNOSIS — K7581 Nonalcoholic steatohepatitis (NASH): Secondary | ICD-10-CM | POA: Diagnosis not present

## 2020-10-06 LAB — SAMPLE TO BLOOD BANK

## 2020-10-06 LAB — HEMOGLOBIN AND HEMATOCRIT, BLOOD
HCT: 34.8 % — ABNORMAL LOW (ref 36.0–46.0)
Hemoglobin: 11.1 g/dL — ABNORMAL LOW (ref 12.0–15.0)

## 2020-10-06 MED ORDER — HEPARIN SOD (PORK) LOCK FLUSH 100 UNIT/ML IV SOLN
500.0000 [IU] | Freq: Once | INTRAVENOUS | Status: AC
  Administered 2020-10-06: 500 [IU] via INTRAVENOUS

## 2020-10-07 ENCOUNTER — Other Ambulatory Visit: Payer: Self-pay | Admitting: General Surgery

## 2020-10-14 DIAGNOSIS — K7581 Nonalcoholic steatohepatitis (NASH): Secondary | ICD-10-CM | POA: Diagnosis not present

## 2020-10-15 ENCOUNTER — Other Ambulatory Visit: Payer: Self-pay | Admitting: Family Medicine

## 2020-10-15 DIAGNOSIS — D649 Anemia, unspecified: Secondary | ICD-10-CM

## 2020-10-15 MED ORDER — LIDOCAINE-PRILOCAINE 2.5-2.5 % EX CREA: 1.0000 "application " | TOPICAL_CREAM | Freq: Every day | CUTANEOUS | 2 refills | Status: DC | PRN

## 2020-10-15 MED ORDER — LIDOCAINE-PRILOCAINE 2.5 %-2.5 % TOPICAL CREAM
TOPICAL | 0 days
Start: 2020-10-15 — End: ?

## 2020-10-15 NOTE — Unmapped (Signed)
Called patient and spoke to patient's husband/Michael in regards to scheduling the patient for follow up appointments with Dr. Ruffin Frederick, Nutrition, Psychology (virtually), and the Transplant Nutrition. He stated understanding. Let him know that I will need to get with Dr. Romelle Starcher scheduler to get an appointment overbooked with him. Let him know that the next available appointment will not be until May. Again he stated understanding. Asked him if there were any days that I would need to tell them to avoid.  He stated that they were none. Let him know once that appointment is scheduled that I will add the other in person appointments to that day and give him a call back to go over the schedule. Again he stated understanding.

## 2020-10-16 NOTE — Unmapped (Signed)
Insurance has been verified.   ??  ??Patient has active coverage with??[employer], including prescription drug coverage via??[employer] [phone]  ??  Authorization is??[id number]??for Liver transplant listing valid 04/05/20 to 04/05/21.??  ??  ??Patient is financially cleared for Liver transplant listing

## 2020-10-18 DIAGNOSIS — I10 Essential (primary) hypertension: Secondary | ICD-10-CM | POA: Diagnosis not present

## 2020-10-18 DIAGNOSIS — R079 Chest pain, unspecified: Secondary | ICD-10-CM | POA: Diagnosis not present

## 2020-10-18 MED ORDER — BUSPIRONE 10 MG TABLET
ORAL_TABLET | 0 refills | 0 days | Status: CP
Start: 2020-10-18 — End: ?

## 2020-10-18 NOTE — Unmapped (Signed)
Pt request for RX Refill busPIRone HCl 10 MG Oral Tablet

## 2020-10-20 ENCOUNTER — Other Ambulatory Visit (INDEPENDENT_AMBULATORY_CARE_PROVIDER_SITE_OTHER): Payer: Self-pay | Admitting: *Deleted

## 2020-10-20 ENCOUNTER — Encounter (HOSPITAL_COMMUNITY): Admission: RE | Admit: 2020-10-20 | Payer: PPO | Source: Ambulatory Visit

## 2020-10-20 DIAGNOSIS — K7460 Unspecified cirrhosis of liver: Secondary | ICD-10-CM

## 2020-10-20 DIAGNOSIS — D500 Iron deficiency anemia secondary to blood loss (chronic): Secondary | ICD-10-CM

## 2020-10-20 DIAGNOSIS — K7682 Hepatic encephalopathy: Secondary | ICD-10-CM

## 2020-10-20 DIAGNOSIS — K7290 Hepatic failure, unspecified without coma: Secondary | ICD-10-CM

## 2020-10-21 ENCOUNTER — Other Ambulatory Visit (INDEPENDENT_AMBULATORY_CARE_PROVIDER_SITE_OTHER): Payer: Self-pay | Admitting: Internal Medicine

## 2020-10-21 NOTE — Unmapped (Signed)
Called patient and spoke to patient's husband/Michael in regards to getting the patient scheduled with Dr. Ruffin Frederick. Let him know that the people Burman Nieves and Alondra Park) that are over Dr. Romelle Starcher template has gotten back with me about adding an appointment for the patient on 10/26/20 at 8am or 9am. Asked them which one they could do. He stated that the later one would be best. But wanted to see if he could do something a little later. He stated that it is hard to get her up early for appointments. He then stated that he would still take the 9am if a later appointment could not be scheduled. Told him that I would ask, but if not I would tell them that they would take the 9am. He stated understanding. Told patient that I will call him back and let him know the final schedule.    Once off the phone with Casimiro Needle saw on the request that the patient also needs to see the Transplant Surgeon. 10/26/20 is on a Tuesday and the Surgeon does not have Clinic on that day. Sent an email back to Burman Nieves and Seward Grater to see if a Monday appointment could be possible. Waiting to hear back from them about that.

## 2020-10-22 MED ORDER — LACTULOSE 10 GRAM/15 ML ORAL SOLUTION
0 days
Start: 2020-10-22 — End: ?

## 2020-10-22 NOTE — Unmapped (Signed)
Called patient's husband/Michael to let him know that the latest that we could get the patient scheduled with Dr. Ruffin Frederick would be at 9:30am. He stated understanding and stated that would be fine. Let him know that the Nutrition appointment would be right after Dr. Romelle Starcher appointment. Again he stated understanding. Let him know that the patient will also need to see the Transplant Surgeon and Psychologist. Asked if they could come back on a Monday in this month to see them. He stated that is not a problem and could do that. Asked him if they were available either on the 21st or 28th of this month.  He stated that either day will work. Told him that I would get them scheduled and send them an appointment letter. Again he stated understanding.

## 2020-10-24 MED ORDER — BUMETANIDE 2 MG TABLET
ORAL_TABLET | 0 refills | 0 days
Start: 2020-10-24 — End: ?

## 2020-10-25 DIAGNOSIS — Z7682 Awaiting organ transplant status: Principal | ICD-10-CM

## 2020-10-25 DIAGNOSIS — K7210 Chronic hepatic failure without coma: Principal | ICD-10-CM

## 2020-10-25 MED ORDER — BUMETANIDE 2 MG TABLET
ORAL_TABLET | 0 refills | 0 days | Status: CP
Start: 2020-10-25 — End: ?

## 2020-10-25 NOTE — Unmapped (Signed)
Pt request for RX Refill Bumetanide 2 MG Oral Tablet

## 2020-10-26 ENCOUNTER — Ambulatory Visit
Admit: 2020-10-26 | Discharge: 2020-10-26 | Payer: PRIVATE HEALTH INSURANCE | Attending: Internal Medicine | Primary: Internal Medicine

## 2020-10-26 ENCOUNTER — Ambulatory Visit
Admit: 2020-10-26 | Discharge: 2020-10-26 | Payer: PRIVATE HEALTH INSURANCE | Attending: Nutritionist | Primary: Nutritionist

## 2020-10-26 DIAGNOSIS — K7210 Chronic hepatic failure without coma: Principal | ICD-10-CM

## 2020-10-26 DIAGNOSIS — Z7682 Awaiting organ transplant status: Principal | ICD-10-CM

## 2020-10-26 DIAGNOSIS — E119 Type 2 diabetes mellitus without complications: Principal | ICD-10-CM

## 2020-10-26 DIAGNOSIS — Z6832 Body mass index (BMI) 32.0-32.9, adult: Principal | ICD-10-CM

## 2020-10-26 DIAGNOSIS — Z713 Dietary counseling and surveillance: Principal | ICD-10-CM

## 2020-10-26 DIAGNOSIS — K7469 Other cirrhosis of liver: Principal | ICD-10-CM

## 2020-10-26 DIAGNOSIS — K7460 Unspecified cirrhosis of liver: Principal | ICD-10-CM

## 2020-10-26 DIAGNOSIS — Z794 Long term (current) use of insulin: Principal | ICD-10-CM

## 2020-10-26 LAB — COMPREHENSIVE METABOLIC PANEL
ALBUMIN: 2.4 g/dL — ABNORMAL LOW (ref 3.4–5.0)
ALKALINE PHOSPHATASE: 136 U/L — ABNORMAL HIGH (ref 46–116)
ALT (SGPT): 36 U/L (ref 10–49)
ANION GAP: 9 mmol/L (ref 5–14)
AST (SGOT): 49 U/L — ABNORMAL HIGH (ref <=34)
BILIRUBIN TOTAL: 3 mg/dL — ABNORMAL HIGH (ref 0.3–1.2)
BLOOD UREA NITROGEN: 25 mg/dL — ABNORMAL HIGH (ref 9–23)
BUN / CREAT RATIO: 18
CALCIUM: 9.4 mg/dL (ref 8.7–10.4)
CHLORIDE: 103 mmol/L (ref 98–107)
CO2: 22 mmol/L (ref 20.0–31.0)
CREATININE: 1.39 mg/dL — ABNORMAL HIGH
EGFR CKD-EPI AA FEMALE: 46 mL/min/{1.73_m2} — ABNORMAL LOW (ref >=60)
EGFR CKD-EPI NON-AA FEMALE: 40 mL/min/{1.73_m2} — ABNORMAL LOW (ref >=60)
GLUCOSE RANDOM: 187 mg/dL — ABNORMAL HIGH (ref 70–179)
POTASSIUM: 4.3 mmol/L (ref 3.4–4.5)
PROTEIN TOTAL: 5.6 g/dL — ABNORMAL LOW (ref 5.7–8.2)
SODIUM: 134 mmol/L — ABNORMAL LOW (ref 135–145)

## 2020-10-26 LAB — CBC
HEMATOCRIT: 38.9 % (ref 36.0–46.0)
HEMOGLOBIN: 12.4 g/dL (ref 12.0–16.0)
MEAN CORPUSCULAR HEMOGLOBIN CONC: 31.9 g/dL (ref 31.0–37.0)
MEAN CORPUSCULAR HEMOGLOBIN: 32.4 pg (ref 26.0–34.0)
MEAN CORPUSCULAR VOLUME: 101.5 fL — ABNORMAL HIGH (ref 80.0–100.0)
MEAN PLATELET VOLUME: 11.6 fL — ABNORMAL HIGH (ref 7.0–10.0)
PLATELET COUNT: 46 10*9/L — ABNORMAL LOW (ref 150–440)
RED BLOOD CELL COUNT: 3.83 10*12/L — ABNORMAL LOW (ref 4.00–5.20)
RED CELL DISTRIBUTION WIDTH: 18.8 % — ABNORMAL HIGH (ref 12.0–15.0)
WBC ADJUSTED: 5.4 10*9/L (ref 4.5–11.0)

## 2020-10-26 LAB — PROTIME-INR
INR: 1.55
PROTIME: 18 s — ABNORMAL HIGH (ref 10.3–13.4)

## 2020-10-26 NOTE — Unmapped (Signed)
[facility], [place]      Referring Provider:  Janyth Pupa, MD  [address]  [id number] [place],  [place] [address]    Primary Care Provider:  Ignatius Specking, MD    Other Specialist(s):          Reason for Visit: Marilyn French(DOB: 1956-05-15) is a 65 y.o. female who returns for decompensated cirrhosis.    Problem List:    has Abnormal echocardiogram; NAFLD (nonalcoholic fatty liver disease); Cirrhosis (CMS-HCC); GAVE (gastric antral vascular ectasia); Hypothyroid; Chronic blood loss anemia; Generalized anxiety disorder; Hepatic encephalopathy (CMS-HCC); Iron deficiency anemia; Type 2 diabetes mellitus without complication, with long-term current use of insulin (CMS-HCC); Pulmonary hypertension (CMS-HCC); Aspiration into airway; and Thrombocytopenia (CMS-HCC) on their problem list.     Assessment:        65 yo with decompensated NASH cirrhosis and poor functional status.  Cirrhosis complicated by anemia fatigue, PSE and fluid overload.    She has been turned down twice for OLT in the past due to fraility, but over the summer was provisionally approved after improved strength.  Unfortunately she broke her hip and is now rehabilitating.      HGB as good today has it has been recently.      MELD-Na score: 21 at 10/26/2020 12:31 PM  MELD score: 19 at 10/26/2020 12:31 PM  Calculated from:  Serum Creatinine: 1.39 mg/dL at 05/26/1190 47:82 PM  Serum Sodium: 134 mmol/L at 10/26/2020 12:31 PM  Total Bilirubin: 3.0 mg/dL at 05/23/6212 08:65 PM  INR(ratio): 1.55 at 10/26/2020 12:31 PM  Age: 40 years     Plan:         -PT to regain strength  -continue to work with nutrition  -lactulose and xifaxan for PSE   -liver US for HCC  -continue diuretics  -rtc 3 months      CHIEF COMPLAINT: fatigue    History of Present Illness: This is a 65 y.o. year old female with history of cirrhosis secondary to NAFLD.    She was last seen hepatology clinic on 07/2018.  Since then she has had multiple hospitalizations for anemia.  She has been deferred/turned down for OLT x 2 since then as well due to frailty.       Complications of her liver disease include variceal bleed, ascites and hepatic encephalopathy.    In regards to encephalopathy she is currently on lactulose monotherapy.  She is achieving 3???5 bowel movements a day.  Her ascites is currently being managed with torsemide 20 mg daily and spironolactone 50 mg daily.  She has not required any LVP's.  She did undergo a right heart catheterization in 01/2018 at [facility] which showed no evidence for pulmonary hypertension.  She is also seen [facility] pulmonary.  She underwent a DEXA scan on 12/25/2017.  An MRI from 12/25/2017 showed no evidence for [facility].  There is questionable nonocclusive portal vein thrombus.  Since her last clinic visit patient did undergo a repeat MRI and CT on 03/28/2018.  Her MRI was reviewed at [facility] conference and felt the PVT persists and is concerning for progression, worsening portal htn and compromising her transplant candidacy by loss of PV and SMV. ??Anti-coagulation is not a good option due to her chronic PHG/GAVE blood loss.  We recommended proceeding with a TIPS.  She underwent a TIPS on 07/08/2018.  She also underwent embolization of mesocaval varices.  Her hepatic venous pressure gradient went from 10 to 2  mm Hg.  Since her TIPS she was hospitalized for hepatic encephalopathy on 07/26/2018 and now started on Xifaxan.  Patient has been having some difficulty in obtaining due to its cost.  She is scheduled to see transplant surgery today as well as get ultrasound.  She does have a history of hypertension and hyperlipidemia. She is immune to hepatitis A and B.    Interval HX    Since her last visit she broke her hip and has been in rehab trying to get stronger.  Now at home.  Some volume overload but now getting better.    Social History:    Social History     Socioeconomic History   ??? Marital status: Married     Spouse name: Not on file   ??? Number of children: Not on file   ??? Years of education: Not on file   ??? Highest education level: Not on file   Occupational History   ??? Not on file   Tobacco Use   ??? Smoking status: Never Smoker   ??? Smokeless tobacco: Never Used   Vaping Use   ??? Vaping Use: Never used   Substance and Sexual Activity   ??? Alcohol use: No   ??? Drug use: No   ??? Sexual activity: Not on file   Other Topics Concern   ??? Not on file   Social History Narrative   ??? Not on file     Social Determinants of Health     Financial Resource Strain: Low Risk    ??? Difficulty of Paying Living Expenses: Not hard at all   Food Insecurity: No Food Insecurity   ??? Worried About Programme researcher, broadcasting/film/video in the Last Year: Never true   ??? Ran Out of Food in the Last Year: Never true   Transportation Needs: No Transportation Needs   ??? Lack of Transportation (Medical): No   ??? Lack of Transportation (Non-Medical): No   Physical Activity: Not on file   Stress: Not on file   Social Connections: Not on file         Medications:      Current Outpatient Medications:   ???  bumetanide (BUMEX) 2 MG tablet, Take 1 tablet by mouth twice daily, Disp: 60 tablet, Rfl: 0  ???  busPIRone (BUSPAR) 10 MG tablet, Take 1 tablet by mouth twice daily, Disp: 120 tablet, Rfl: 0  ???  cholecalciferol, vitamin D3, (CHOLECALCIFEROL) 1,000 unit tablet, Take 1,000 Units by mouth every other day. , Disp: , Rfl:   ???  ferrous sulfate 325 (65 FE) MG tablet, Take 1 tablet (325 mg total) by mouth daily., Disp: 30 tablet, Rfl: 3  ???  insulin glargine (LANTUS SOLOSTAR U-100 INSULIN) 100 unit/mL (3 mL) injection pen, Inject 0.45 mL (45 Units total) under the skin daily. (Patient taking differently: Inject 60 Units under the skin daily. ), Disp: 1350 Units, Rfl: 0  ???  insulin lispro (HUMALOG) 100 unit/mL injection, INJECT 15 TO 35 UNITS SUBCUTANEUOSLY PER SLIDING SCALE 3 TIMES DAILY, Disp: , Rfl:   ???  lactulose (CHRONULAC) 10 gram/15 mL solution, TAKE 4 TIMES A DAY. IF HAVING WATERY STOOLS MORE THAN 3 TIMES A DAY THEN REDUCE TO 3 TIMES A DAY, Disp: , Rfl:   ???  levothyroxine (SYNTHROID, LEVOTHROID) 25 MCG tablet, Take 25 mcg by mouth daily., Disp: , Rfl:   ???  lidocaine-prilocaine (EMLA) 2.5-2.5 % cream, Apply topically., Disp: , Rfl:   ???  MAGNESIUM ORAL, Take  by mouth., Disp: , Rfl:   ???  pantoprazole (PROTONIX) 40 MG tablet, daily., Disp: , Rfl:   ???  pen needle, diabetic 31 gauge x 1/4 Ndle, , Disp: , Rfl:   ???  potassium chloride SA (K-DUR,KLOR-CON) 20 MEQ tablet, Take 20 mEq by mouth daily. , Disp: , Rfl:   ???  propranoloL (INDERAL) 20 MG tablet, daily., Disp: , Rfl:   ???  rifAXIMin (XIFAXAN) 550 mg Tab, Take 1 tablet (550 mg total) by mouth Two (2) times a day., Disp: 60 tablet, Rfl: 11  ???  rosuvastatin (CRESTOR) 5 MG tablet, Take 5 mg by mouth every other day., Disp: , Rfl:   ???  sertraline (ZOLOFT) 50 MG tablet, Take 1 tablet by mouth once daily, Disp: 60 tablet, Rfl: 0  ???  spironolactone (ALDACTONE) 100 MG tablet, Take 1 tablet (100 mg total) by mouth daily., Disp: 30 tablet, Rfl: 11  ???  ZINC ACETATE ORAL, Take by mouth., Disp: , Rfl:   ???  pediatric multivit-iron-min (FLINTSTONES COMPLETE) tablet, Chew 2 tablets daily. , Disp: , Rfl:       Vital Signs:     BP 141/66  - Pulse 92  - Temp 37 ??C (98.6 ??F) (Tympanic)  - Ht 160 cm (5' 3)  - Wt 82.9 kg (182 lb 11.2 oz)  - LMP  (LMP Unknown)  - SpO2 99%  - BMI 32.36 kg/m??   Body mass index is 32.36 kg/m??.    Physical Exam:    Normal comprehensive exam:      Constitutional:   Alert, oriented x 3, no acute distress, frail, in wheel chair but able to stand.   Mental Status:   Thought organized, appropriate affect, normal fluent speech.   HEENT:   PEERL, conjunctiva clear, anicteric, oropharynx clear, neck supple, no LAD.   Respiratory: Clear to auscultation, and percussion to the bases, unlabored breathing.     Cardiac: Regular rate and rhythm normal S1 and S2, no murmur.      Abdomen: Soft, non-distended, non-tender, no organomegaly or masses.     Perianal/Rectal Exam Not performed.     Extremities:   No edema, well perfused.   Musculoskeletal: No joint swelling or tenderness noted, no deformities.     Skin: No rashes, jaundice or skin lesions noted.     Neuro: No focal deficits.          Diagnostic Studies:  I have reviewed all pertinent diagnostic studies, including:    GI Procedures  reviewed  Radiographic studies  reviewed    Laboratory results    Office Visit on 10/26/2020   Component Date Value Ref Range Status   ??? PT 10/26/2020 18.0* 10.3 - 13.4 sec Final   ??? INR 10/26/2020 1.55   Final   ??? WBC 10/26/2020 5.4  4.5 - 11.0 10*9/L Final   ??? RBC 10/26/2020 3.83* 4.00 - 5.20 10*12/L Final   ??? HGB 10/26/2020 12.4  12.0 - 16.0 g/dL Final   ??? HCT 16/06/9603 38.9  36.0 - 46.0 % Final   ??? MCV 10/26/2020 101.5* 80.0 - 100.0 fL Final   ??? MCH 10/26/2020 32.4  26.0 - 34.0 pg Final   ??? MCHC 10/26/2020 31.9  31.0 - 37.0 g/dL Final   ??? RDW 54/05/8118 18.8* 12.0 - 15.0 % Final   ??? MPV 10/26/2020 11.6* 7.0 - 10.0 fL Final   ??? Platelet 10/26/2020 46* 150 - 440 10*9/L Final   ??? Sodium 10/26/2020 134* 135 - 145  mmol/L Final   ??? Potassium 10/26/2020 4.3  3.4 - 4.5 mmol/L Final   ??? Chloride 10/26/2020 103  98 - 107 mmol/L Final   ??? Anion Gap 10/26/2020 9  5 - 14 mmol/L Final   ??? CO2 10/26/2020 22.0  20.0 - 31.0 mmol/L Final   ??? BUN 10/26/2020 25* 9 - 23 mg/dL Final   ??? Creatinine 10/26/2020 1.39* 0.60 - 0.80 mg/dL Final   ??? BUN/Creatinine Ratio 10/26/2020 18   Final   ??? EGFR CKD-EPI Non-African American,* 10/26/2020 40* >=60 mL/min/1.20m2 Final   ??? EGFR CKD-EPI African American, Fem* 10/26/2020 46* >=60 mL/min/1.1m2 Final   ??? Glucose 10/26/2020 187* 70 - 179 mg/dL Final   ??? Calcium 82/95/6213 9.4  8.7 - 10.4 mg/dL Final   ??? Albumin 08/65/7846 2.4* 3.4 - 5.0 g/dL Final   ??? Total Protein 10/26/2020 5.6* 5.7 - 8.2 g/dL Final   ??? Total Bilirubin 10/26/2020 3.0* 0.3 - 1.2 mg/dL Final   ??? AST 96/29/5284 49* <=34 U/L Final   ??? ALT 10/26/2020 36  10 - 49 U/L Final   ??? Alkaline Phosphatase 10/26/2020 136* 46 - 116 U/L Final     Lab Results Component Value Date    WBC 5.4 10/26/2020    HGB 12.4 10/26/2020    HCT 38.9 10/26/2020    PLT 46 (L) 10/26/2020       Lab Results   Component Value Date    NA 134 (L) 10/26/2020    K 4.3 10/26/2020    CL 103 10/26/2020    CO2 22.0 10/26/2020    BUN 25 (H) 10/26/2020    CREATININE 1.39 (H) 10/26/2020    GLU 187 (H) 10/26/2020    CALCIUM 9.4 10/26/2020    MG 2.4 07/14/2020    PHOS 3.9 07/14/2020       Lab Results   Component Value Date    BILITOT 3.0 (H) 10/26/2020    BILIDIR 1.00 (H) 07/14/2020    PROT 5.6 (L) 10/26/2020    ALBUMIN 2.4 (L) 10/26/2020    ALT 36 10/26/2020    AST 49 (H) 10/26/2020    ALKPHOS 136 (H) 10/26/2020    GGT 49 (H) 02/19/2020       Lab Results   Component Value Date    PT 18.0 (H) 10/26/2020    INR 1.55 10/26/2020    APTT 29.5 02/19/2020

## 2020-10-26 NOTE — Unmapped (Signed)
Pt presented to clinic via wheelchair with her husband, Casimiro Needle.  Pt has recovered from recent hip frx and surgery.  Pt to be seen by nutrition, but was able to stand and has been working with home PT. Pts husband reports it has been less frequent lately due to covid, but he encourages her to walk in the house and is on top of her medical care. Medications reviewed, refill sent yesterday for Bumex.  Labs drawn today with MELD of 21. Pt due for [facility] screening, imaging ordered and TPA request sent for scheduling. Pt and husband in agreement with POC going forward.

## 2020-10-26 NOTE — Unmapped (Signed)
[facility] Outpatient Nutrition Services   Medical Nutrition Therapy Consultation       Visit Type:    Return Assessment    Referral Reason: :  Liver Transplant - Internally listed follow up       Marilyn French is a 65 y.o. female seen for medical nutrition therapy for follow up on frailty and nutrition status while listed for transplant. Her active problem list, medication list, allergies and notes from last encounter were reviewed.     Her interim medical history is significant for hip fracture in November 2021.    Anthropometrics   Estimated body mass index is 32.36 kg/m?? as calculated from the following:    Height as of an earlier encounter on 10/26/20: 160 cm (5' 3).    Weight as of an earlier encounter on 10/26/20: 82.9 kg (182 lb 11.2 oz).    Wt Readings from Last 5 Encounters:   10/26/20 82.9 kg (182 lb 11.2 oz)   07/12/20 91.9 kg (202 lb 9.6 oz)   06/18/20 81.6 kg (180 lb)   04/10/20 88.2 kg (194 lb 7.1 oz)   02/19/20 81.2 kg (179 lb)      Usual body weight: 175- 185 lbs per patient at home ; she had gained to above 200 lbs during hospitalization (per husband team took her off of diuretics)  Ideal Body Weight:   59 kg       Nutrition Risk Screening:     Nutrition Focused Physical Exam:  Nutrition Focused Physical Exam:  Fat Areas Examined  Orbital: Mild loss  Upper Arm: Mild loss      Muscle Areas Examined  Temple: Mild loss       Historical [facility] Fraility Index  11/03/19: 4.34, pre-frail       [facility] Fraility Index    Gender: Female     Dominant Hand Grip Strength:   Attempt 1: 17.2   Attempt 2: 15.6   Attempt 3: 16.2   Average: 16.33    Time to Do 5 Chair stands:  20 Secs    Seconds Holding 3 positions:   Side by Side: 10   SemiTandem: 10   Tandem: 10 (had difficulty standing this way, monitored closely)    [email or web address]      This patient's frailty index score is  4.46  which puts the patient  in the 80th percentile for frailty (pre-frail) among patients with cirrhosis who are listed for transplant.  Any score > 4.5 is considered frail.    Malnutrition Screening:   Malnutrition Assessment using AND/ASPEN Clinical Characteristics:  Patient does not meet AND/ASPEN criteria for malnutrition at this time (10/26/20 65 1720)       Biochemical Data, Medical Tests and Procedures:  All pertinent labs and imaging reviewed by Lanelle Bal at 9:28 AM 10/26/2020.    Blood glucose control  - checks 3x per day before meals. Usually runs 180- 210 per husband     Lab Results   Component Value Date    Hemoglobin A1C 5.4 02/19/2020    Hemoglobin A1C 4.7 (L) 07/12/2019    Hemoglobin A1C 6.5 (H) 01/02/2018      No results found for: VITAMINA  Lab Results   Component Value Date    Vitamin D Total (25OH) 21.9 10/12/2019     No results found for: VITAME  No results found for: CRP  No results found for: ZINC  No results found for: COPPER    Lab Results   Component Value Date  BUN 25 (H) 10/26/2020    CREATININE 1.39 (H) 10/26/2020    GFRAA >=60 01/28/2018    GFRNONAA >=60 01/28/2018    NA 134 (L) 10/26/2020    K 4.3 10/26/2020    CL 103 10/26/2020    CO2 22.0 10/26/2020    CALCIUM 9.4 10/26/2020    PHOS 3.9 07/14/2020    ALBUMIN 2.4 (L) 10/26/2020    PTH 51.9 10/10/2019       Medications and Vitamin/Mineral Supplementation:   All nutritionally pertinent medications reviewed on 10/26/2020.   Nutritionally pertinent medications include: Bumex, lantus + humalog, synthroid, rifaximin, spironolactone    She is taking nutrition supplements. Vitamin D3, ferrous sulfate, magnesium, potassium chloride    Current Outpatient Medications   Medication Sig Dispense Refill   ??? bumetanide (BUMEX) 2 MG tablet Take 1 tablet by mouth twice daily 60 tablet 0   ??? busPIRone (BUSPAR) 10 MG tablet Take 1 tablet by mouth twice daily 120 tablet 0   ??? cholecalciferol, vitamin D3, (CHOLECALCIFEROL) 1,000 unit tablet Take 1,000 Units by mouth every other day.      ??? ferrous sulfate 325 (65 FE) MG tablet Take 1 tablet (325 mg total) by mouth daily. 30 tablet 3   ??? insulin glargine (LANTUS SOLOSTAR U-100 INSULIN) 100 unit/mL (3 mL) injection pen Inject 0.45 mL (45 Units total) under the skin daily. (Patient taking differently: Inject 60 Units under the skin daily. ) 1350 Units 0   ??? insulin lispro (HUMALOG) 100 unit/mL injection INJECT 15 TO 35 UNITS SUBCUTANEUOSLY PER SLIDING SCALE 3 TIMES DAILY     ??? lactulose (CHRONULAC) 10 gram/15 mL solution TAKE 4 TIMES A DAY. IF HAVING WATERY STOOLS MORE THAN 3 TIMES A DAY THEN REDUCE TO 3 TIMES A DAY     ??? levothyroxine (SYNTHROID, LEVOTHROID) 25 MCG tablet Take 25 mcg by mouth daily.     ??? lidocaine-prilocaine (EMLA) 2.5-2.5 % cream Apply topically.     ??? MAGNESIUM ORAL Take by mouth.     ??? pantoprazole (PROTONIX) 40 MG tablet daily.     ??? pediatric multivit-iron-min (FLINTSTONES COMPLETE) tablet Chew 2 tablets daily.      ??? pen needle, diabetic 31 gauge x 1/4 Ndle      ??? potassium chloride SA (K-DUR,KLOR-CON) 20 MEQ tablet Take 20 mEq by mouth daily.      ??? propranoloL (INDERAL) 20 MG tablet daily.     ??? rifAXIMin (XIFAXAN) 550 mg Tab Take 1 tablet (550 mg total) by mouth Two (2) times a day. 60 tablet 11   ??? rosuvastatin (CRESTOR) 5 MG tablet Take 5 mg by mouth every other day.     ??? sertraline (ZOLOFT) 50 MG tablet Take 1 tablet by mouth once daily 60 tablet 0   ??? spironolactone (ALDACTONE) 100 MG tablet Take 1 tablet (100 mg total) by mouth daily. 30 tablet 11   ??? ZINC ACETATE ORAL Take by mouth.       No current facility-administered medications for this visit.       Nutrition History:     Dietary Restrictions: No known food allergies or food intolerances.    Patient following low sodium diet     Gastrointestinal Issues: Nausea Bowel movements per day: 3-4  with lactulose     Hunger and Satiety: Denied issues.  Patient reports usually having a decent appetite. On her days when she does not have an appetite she can always force herself to eat  Food Safety and Access: No to little issues noted.     Diet Recall:   Time Intake   Breakfast Egg sandwich or cereal + milk or oatmeal + PB   Snack (AM) Fruit    Lunch Chicken pot pie or hot dog or [place] with dressing or tomato soup & sandwich   Snack (PM)    Dinner Potatoes + protein and usually vegetable    Snack (HS)      Food-Related History:  Beverages: orange juice in the AM, milk 1x daily, otherwise water   Dining Out:  Husband cooks patient meals     Physical Activity:  Physical activity level is sedentary with little to no exercise.  Per husband patient does not participate in exercise, only walking around the house with cane    Daily Estimated Nutrient Needs:  Estimated Energy Needs: 1950- 2340 kcal/day (25- 30 kcal/kg)  Estimated Protein Needs:  95- 115 g protein/day (1.2-1.5 g/kg)  Estimated Fluid Needs:  per MD    Nutrition Goals & Evaluation      1. Meet estimated daily needs (Progressing)  2. Normal vitamin levels (Progressing)  3. Balanced macronutrient intake (Progressing)  5. Hemoglobin A1c <7% (New) - per husband patient has needed several blood transfusions so does not have an updated A1c     Nutrition goals reviewed, and relevant barriers identified and addressed: desire/motivation. She is evaluated to have fair willingness and ability to achieve nutrition goals.     Nutrition Assessment     Per the patient's diet recall, nutrition intake is progressing and likely close to meeting estimated nutrition needs. Patient's husband spoke for the patient frequently throughout visit. Patient's husband prepares patient's meals, gives her medicine and checks her blood glucose at least 3x per day. Patient with uncontrolled glucose levels recently, likely related to less closely monitoring her diet. Per husband, patient has been recovering from a hip fracture and he wanted to make sure she was getting enough energy in her system to recover. Given her overall clinical picture this seems appropriate. Ideally patient would see endocrine to assess insulin regimen, as patient would benefit greatly from continued adequate protein and calorie intake. Patient also remains pre-frail per LFI, which is a good sign as she is only ~2.5 months out from the hip fracture. Encouraged patient to increase activity level so she does not lose any strength. She is very close to falling in to the frail category at this time. Grip strength was low and her balance was marginal.      Transplant Nutrition Assessment:    The patient is internally listed for transplant at [facility].     Nutrition Intervention      Nutrition Education: transplant goals and expectations, cirrhosis medical nutrition therapy     Materials Provided were: verbal tips and recommendations     Nutrition Plan:  ??? Reach out to endocrinologist for follow up appointment to assess insulin regimen - concerned that patient's blood sugars ~200 every day   ??? Increase protein intake by 1-2 servings per day     ??? Space meals out every 3-4 hours while awake  ??? Consume a night time snack   ??? Increase exercise to 30 minutes, 5 days per week - this is important to avoid frailty       Follow up will occur in 3-6 months.     Food/Nutrition-related history, Anthropometric measurements and Effectiveness of nutrition interventions will be assessed at time of follow-up.  Recommendations for Care Team:  Patient would benefit from endocrine visit for assessment of insulin regimen and increased exercise to help maintain current frailty score.        Time spent 31 minutes       Lanelle Bal, RD, LDN  Abdominal Transplant Dietitian   Pager: [phone]

## 2020-10-27 ENCOUNTER — Encounter (HOSPITAL_COMMUNITY): Payer: Self-pay

## 2020-10-27 ENCOUNTER — Other Ambulatory Visit (INDEPENDENT_AMBULATORY_CARE_PROVIDER_SITE_OTHER): Payer: Self-pay | Admitting: Internal Medicine

## 2020-10-27 ENCOUNTER — Other Ambulatory Visit: Payer: Self-pay

## 2020-10-27 ENCOUNTER — Encounter (HOSPITAL_COMMUNITY)
Admission: RE | Admit: 2020-10-27 | Discharge: 2020-10-27 | Disposition: A | Discharge status: Home / Self Care | Payer: PPO | Source: Ambulatory Visit | Attending: Internal Medicine | Admitting: Internal Medicine

## 2020-10-27 DIAGNOSIS — K7581 Nonalcoholic steatohepatitis (NASH): Secondary | ICD-10-CM | POA: Insufficient documentation

## 2020-10-27 DIAGNOSIS — G9341 Metabolic encephalopathy: Secondary | ICD-10-CM

## 2020-10-27 DIAGNOSIS — K7290 Hepatic failure, unspecified without coma: Secondary | ICD-10-CM

## 2020-10-27 DIAGNOSIS — K7682 Hepatic encephalopathy: Secondary | ICD-10-CM

## 2020-10-27 LAB — AMMONIA: Ammonia: 117 umol/L — ABNORMAL HIGH (ref 9–35)

## 2020-10-27 LAB — SAMPLE TO BLOOD BANK

## 2020-10-27 LAB — HEMOGLOBIN AND HEMATOCRIT, BLOOD
HCT: 32.4 % — ABNORMAL LOW (ref 36.0–46.0)
Hemoglobin: 10.4 g/dL — ABNORMAL LOW (ref 12.0–15.0)

## 2020-10-27 MED ORDER — HEPARIN SOD (PORK) LOCK FLUSH 100 UNIT/ML IV SOLN
500.0000 [IU] | Freq: Once | INTRAVENOUS | Status: AC
  Administered 2020-10-27: 500 [IU] via INTRAVENOUS

## 2020-11-01 ENCOUNTER — Encounter (HOSPITAL_COMMUNITY)
Admission: RE | Admit: 2020-11-01 | Discharge: 2020-11-01 | Disposition: A | Discharge status: Home / Self Care | Payer: PPO | Source: Ambulatory Visit | Attending: Internal Medicine | Admitting: Internal Medicine

## 2020-11-01 ENCOUNTER — Other Ambulatory Visit: Payer: Self-pay

## 2020-11-01 DIAGNOSIS — K7581 Nonalcoholic steatohepatitis (NASH): Secondary | ICD-10-CM | POA: Diagnosis not present

## 2020-11-01 LAB — AMMONIA: Ammonia: 67 umol/L — ABNORMAL HIGH (ref 9–35)

## 2020-11-01 MED ORDER — HEPARIN SOD (PORK) LOCK FLUSH 100 UNIT/ML IV SOLN
500.0000 [IU] | Freq: Once | INTRAVENOUS | Status: AC
  Administered 2020-11-01: 500 [IU] via INTRAVENOUS
  Filled 2020-11-01: qty 5

## 2020-11-01 MED ORDER — SPIRONOLACTONE 100 MG TABLET
ORAL_TABLET | 0 refills | 0 days | Status: CP
Start: 2020-11-01 — End: ?

## 2020-11-01 NOTE — Unmapped (Signed)
Pt request for RX Refill Spironolactone 100 MG Oral Tablet

## 2020-11-04 DIAGNOSIS — Z299 Encounter for prophylactic measures, unspecified: Secondary | ICD-10-CM | POA: Diagnosis not present

## 2020-11-04 DIAGNOSIS — B373 Candidiasis of vulva and vagina: Secondary | ICD-10-CM | POA: Diagnosis not present

## 2020-11-04 DIAGNOSIS — N390 Urinary tract infection, site not specified: Secondary | ICD-10-CM | POA: Diagnosis not present

## 2020-11-04 DIAGNOSIS — I10 Essential (primary) hypertension: Secondary | ICD-10-CM | POA: Diagnosis not present

## 2020-11-04 DIAGNOSIS — E1165 Type 2 diabetes mellitus with hyperglycemia: Secondary | ICD-10-CM | POA: Diagnosis not present

## 2020-11-10 ENCOUNTER — Encounter (HOSPITAL_COMMUNITY): Payer: PPO

## 2020-11-11 DIAGNOSIS — Z515 Encounter for palliative care: Secondary | ICD-10-CM | POA: Diagnosis not present

## 2020-11-11 DIAGNOSIS — K7581 Nonalcoholic steatohepatitis (NASH): Secondary | ICD-10-CM | POA: Diagnosis not present

## 2020-11-13 NOTE — Unmapped (Signed)
On call page received from [facility] spouse Casimiro Needle. Reports patient with increase ammonia levels and doing badly. Attempted to ascertain if patient taking lactulose - spouse becoming upset stating that isn't the point and something needs to be done for her. States she messed the bed, and fell when unable to take down undergarments. Again attempted to assist by asking about injury / bm's etc. Spouse very upset stating coordinator could show some empathy and caring. Allowed for spouse to talk uninterrupted for some time as he expressed great concern regarding wife's health status and my inability to recognize and help. Again attempted to provide education re: MELD score etc. Spouse not receptive and we ended the call.

## 2020-11-14 ENCOUNTER — Ambulatory Visit
Admit: 2020-11-14 | Discharge: 2020-11-19 | Disposition: A | Discharge status: Home Health | Payer: PRIVATE HEALTH INSURANCE | Admitting: Internal Medicine

## 2020-11-14 DIAGNOSIS — K7460 Unspecified cirrhosis of liver: Secondary | ICD-10-CM | POA: Diagnosis not present

## 2020-11-14 DIAGNOSIS — M797 Fibromyalgia: Secondary | ICD-10-CM | POA: Diagnosis not present

## 2020-11-14 DIAGNOSIS — K760 Fatty (change of) liver, not elsewhere classified: Secondary | ICD-10-CM | POA: Diagnosis not present

## 2020-11-14 DIAGNOSIS — J9811 Atelectasis: Secondary | ICD-10-CM | POA: Diagnosis not present

## 2020-11-14 DIAGNOSIS — E7800 Pure hypercholesterolemia, unspecified: Secondary | ICD-10-CM | POA: Diagnosis not present

## 2020-11-14 DIAGNOSIS — D696 Thrombocytopenia, unspecified: Secondary | ICD-10-CM | POA: Diagnosis not present

## 2020-11-14 DIAGNOSIS — E7229 Other disorders of urea cycle metabolism: Secondary | ICD-10-CM | POA: Diagnosis not present

## 2020-11-14 DIAGNOSIS — I6529 Occlusion and stenosis of unspecified carotid artery: Secondary | ICD-10-CM | POA: Diagnosis not present

## 2020-11-14 DIAGNOSIS — D649 Anemia, unspecified: Secondary | ICD-10-CM | POA: Diagnosis not present

## 2020-11-14 DIAGNOSIS — R109 Unspecified abdominal pain: Secondary | ICD-10-CM | POA: Diagnosis not present

## 2020-11-14 DIAGNOSIS — Z794 Long term (current) use of insulin: Secondary | ICD-10-CM | POA: Diagnosis not present

## 2020-11-14 DIAGNOSIS — E039 Hypothyroidism, unspecified: Secondary | ICD-10-CM | POA: Diagnosis not present

## 2020-11-14 DIAGNOSIS — Z79890 Hormone replacement therapy (postmenopausal): Secondary | ICD-10-CM | POA: Diagnosis not present

## 2020-11-14 DIAGNOSIS — R4182 Altered mental status, unspecified: Secondary | ICD-10-CM | POA: Diagnosis not present

## 2020-11-14 DIAGNOSIS — K31811 Angiodysplasia of stomach and duodenum with bleeding: Secondary | ICD-10-CM | POA: Diagnosis not present

## 2020-11-14 DIAGNOSIS — R531 Weakness: Secondary | ICD-10-CM | POA: Diagnosis not present

## 2020-11-14 DIAGNOSIS — G9340 Encephalopathy, unspecified: Secondary | ICD-10-CM | POA: Diagnosis not present

## 2020-11-14 DIAGNOSIS — E119 Type 2 diabetes mellitus without complications: Secondary | ICD-10-CM | POA: Diagnosis not present

## 2020-11-14 DIAGNOSIS — N179 Acute kidney failure, unspecified: Secondary | ICD-10-CM | POA: Diagnosis not present

## 2020-11-14 DIAGNOSIS — E876 Hypokalemia: Secondary | ICD-10-CM | POA: Diagnosis not present

## 2020-11-14 DIAGNOSIS — K7290 Hepatic failure, unspecified without coma: Secondary | ICD-10-CM | POA: Diagnosis not present

## 2020-11-14 DIAGNOSIS — Z20822 Contact with and (suspected) exposure to covid-19: Secondary | ICD-10-CM | POA: Diagnosis not present

## 2020-11-14 LAB — CBC W/ AUTO DIFF
BASOPHILS ABSOLUTE COUNT: 0 10*9/L (ref 0.0–0.2)
BASOPHILS RELATIVE PERCENT: 1.1 %
EOSINOPHILS ABSOLUTE COUNT: 0.1 10*9/L (ref 0.0–0.4)
EOSINOPHILS RELATIVE PERCENT: 1.7 %
HEMATOCRIT: 30.3 % — ABNORMAL LOW (ref 34.0–44.0)
HEMOGLOBIN: 9.7 g/dL — ABNORMAL LOW (ref 11.5–15.0)
LYMPHOCYTES ABSOLUTE COUNT: 0.9 10*9/L (ref 0.7–4.5)
LYMPHOCYTES RELATIVE PERCENT: 24.9 %
MEAN CORPUSCULAR HEMOGLOBIN CONC: 32 g/dL (ref 32.0–36.0)
MEAN CORPUSCULAR HEMOGLOBIN: 32.6 pg (ref 27.0–34.0)
MEAN CORPUSCULAR VOLUME: 101.7 fL — ABNORMAL HIGH (ref 80.0–98.0)
MEAN PLATELET VOLUME: 12 fL — ABNORMAL HIGH (ref 7.4–10.4)
MONOCYTES ABSOLUTE COUNT: 0.4 10*9/L (ref 0.1–1.0)
MONOCYTES RELATIVE PERCENT: 12.3 %
NEUTROPHILS ABSOLUTE COUNT: 2.1 10*9/L (ref 1.8–7.8)
NEUTROPHILS RELATIVE PERCENT: 60 %
PLATELET COUNT: 53 10*9/L — ABNORMAL LOW (ref 140–415)
RED BLOOD CELL COUNT: 2.98 10*12/L — ABNORMAL LOW (ref 3.80–5.10)
RED CELL DISTRIBUTION WIDTH: 18.2 % — ABNORMAL HIGH (ref 11.5–14.5)
WBC ADJUSTED: 3.5 10*9/L — ABNORMAL LOW (ref 4.0–10.5)

## 2020-11-14 LAB — AMMONIA: AMMONIA: 45 umol/L — ABNORMAL HIGH (ref 11–32)

## 2020-11-14 LAB — COMPREHENSIVE METABOLIC PANEL
ALBUMIN: 2.1 g/dL — ABNORMAL LOW (ref 3.5–5.0)
ALKALINE PHOSPHATASE: 100 U/L (ref 46–116)
ALT (SGPT): 30 U/L (ref 12–78)
ANION GAP: 9 mmol/L (ref 3–11)
AST (SGOT): 39 U/L (ref 15–40)
BILIRUBIN TOTAL: 2.9 mg/dL — ABNORMAL HIGH (ref 0.3–1.2)
BLOOD UREA NITROGEN: 27 mg/dL — ABNORMAL HIGH (ref 8–20)
BUN / CREAT RATIO: 16
CALCIUM: 8.4 mg/dL — ABNORMAL LOW (ref 8.5–10.1)
CHLORIDE: 108 mmol/L — ABNORMAL HIGH (ref 98–107)
CO2: 24.4 mmol/L (ref 21.0–32.0)
CREATININE: 1.73 mg/dL — ABNORMAL HIGH (ref 0.60–1.10)
EGFR CKD-EPI AA FEMALE: 35 mL/min/{1.73_m2}
EGFR CKD-EPI NON-AA FEMALE: 31 mL/min/{1.73_m2}
GLUCOSE RANDOM: 146 mg/dL (ref 70–179)
POTASSIUM: 3.3 mmol/L — ABNORMAL LOW (ref 3.5–5.0)
PROTEIN TOTAL: 4.8 g/dL — ABNORMAL LOW (ref 6.0–8.0)
SODIUM: 141 mmol/L (ref 135–145)

## 2020-11-14 LAB — URINALYSIS WITH CULTURE REFLEX
BILIRUBIN UA: NEGATIVE
GLUCOSE UA: NEGATIVE
KETONES UA: NEGATIVE
LEUKOCYTE ESTERASE UA: NEGATIVE
NITRITE UA: NEGATIVE
PH UA: 5.5 (ref 5.0–8.0)
PROTEIN UA: NEGATIVE
SPECIFIC GRAVITY UA: 1.01 (ref 1.010–1.025)
UROBILINOGEN UA: 0.2

## 2020-11-14 LAB — PROTIME-INR
INR: 1.45 (ref <=5.00)
PROTIME: 14.7 s — ABNORMAL HIGH (ref 9.1–11.0)

## 2020-11-14 LAB — LACTATE DEHYDROGENASE: LACTATE DEHYDROGENASE: 292 U/L — ABNORMAL HIGH (ref 120–246)

## 2020-11-14 MED ADMIN — iohexoL (OMNIPAQUE) 300 mg iodine/mL solution 80 mL: 80 mL | INTRAVENOUS | @ 10:00:00 | Stop: 2020-11-14

## 2020-11-14 MED ADMIN — sodium chloride 0.9% (NS) bolus 1,000 mL: 1000 mL | INTRAVENOUS | @ 10:00:00 | Stop: 2020-11-14

## 2020-11-14 MED ADMIN — lactulose (CHRONULAC) oral solution (30 mL cup): 20 g | ORAL | @ 10:00:00 | Stop: 2020-11-14

## 2020-11-14 MED ADMIN — sodium chloride 0.45% (1/2 NS) infusion: 50 mL/h | INTRAVENOUS | @ 21:00:00

## 2020-11-14 NOTE — Unmapped (Signed)
Inpatient Consult Note    Requesting Attending Physician :  Ralene Bathe, MD  Service Requesting Consult : Emergency Medicine    Assessment  Patient Active Problem List    Diagnosis Date Noted   ??? Thrombocytopenia (CMS-HCC) 10/16/2019   ??? Type 2 diabetes mellitus without complication, with long-term current use of insulin (CMS-HCC) 07/11/2019   ??? Aspiration into airway 07/11/2019   ??? Hepatic encephalopathy (CMS-HCC) 02/28/2019   ??? Generalized anxiety disorder 10/29/2018   ??? NAFLD (nonalcoholic fatty liver disease) 04/54/0981   ??? Cirrhosis (CMS-HCC) 07/07/2018   ??? GAVE (gastric antral vascular ectasia) 07/07/2018   ??? Hypothyroid 07/07/2018   ??? Chronic blood loss anemia 07/07/2018   ??? Abnormal echocardiogram 01/09/2018   ??? Pulmonary hypertension (CMS-HCC) 07/06/2017   ??? Iron deficiency anemia 10/09/2011        Plan:    Weekend coverage for [facility] internal medicine, patient of Dr. Sherril Croon    1. Nonalcoholic fatty liver disease  2. Hepatic encephalopathy  Likely secondary to #1  Patient has been seen several times at [facility], and is reportedly on their transplant list  Patient has been accepted at [facility] for transfer however there are no beds available at this time  Agree with lactulose therapy even though ammonia level was only slightly elevated  Continue with supportive management, continue with gentle IV fluid hydration, will use half-normal saline at a slow rate of 50 mL/h, monitor for fluid overload, if patient starts to third space may need albumin to replete intravascular space.  T bili elevated at 3.0 on arrival, continue to monitor with daily CMP.  Suspect this is largely direct given her underlying liver pathology. Continue to replete potassium with goal range of 4.  Meld sodium score of 20 indicating an estimated 3 to 4% 90-day mortality.    3. Hypothyroidism  Continue levothyroxine once patient able to take oral medications  4. Type 2 diabetes  Glucose 146, continue with Accu-Cheks and monitor, goal range less than 180    History of Present Illness:   Reason for Consult:   Marilyn French is a 65 y.o. female was consulted for Hepatic encephalopathy (CMS-HCC).  Consulted by ER physician for assistance with medical management, patient presents after falling at home and being more altered than usual per chart review and husband.  Patient unable to provide any history for me at this time.  She has a history of nonalcoholic fatty liver disease and has been seen several times at [facility].  She is status post TIPS procedure in 2019.  She follows with transplant hepatology, and has had hepatic encephalopathy in the past.  Patient also has heart failure with preserved ejection fraction with last echocardiogram performed 02/19/2020 with grade 1 intrapulmonary shunt likely secondary to cirrhosis.  Per previous notes at [facility] patient has required albumin in the past for intravascular depletion and elevated creatinine.  This may be an option if creatinine continues to decline.  Patient also has gastric antral vascular ectasia, likely the source of her GI blood loss, and chronic anemia.    Ferumoxytol, Definity [perflutren lipid microspheres], Other, Statins-hmg-coa reductase inhibitors, and Tramadol hcl  (Not in a hospital admission)    Past Medical History:   Diagnosis Date   ??? Cirrhosis (CMS-HCC)    ??? Depression    ??? Diabetes mellitus (CMS-HCC)    ??? Fibromyalgia    ??? GAVE (gastric antral vascular ectasia)    ??? History of transfusion  04/2019    reports frequent blood tranfusions   ??? HTN (hypertension)    ??? Hypercholesterolemia    ??? Hypothyroid    ??? NAFLD (nonalcoholic fatty liver disease)    ??? Osteoporosis      Past Surgical History:   Procedure Laterality Date   ??? APPENDECTOMY     ??? CHOLECYSTECTOMY     ??? IR TIPS  07/08/2018    IR TIPS 07/08/2018 Soledad Gerlach, MD IMG VIR H&V [facility]   ??? PR COLSC FLEXIBLE W/CONTROL BLEEDING ANY METHOD N/A 10/17/2019    Procedure: COLONOSCOPY, FLEXIBLE, PROXIMAL TO SPLENIC FLEXURE; DX, W/CONTROL OF BLEEDING;  Surgeon: Jules Husbands, MD;  Location: [facility] PROC[facility]URES [facility];  Service: Gastroenterology   ??? PR RIGHT HEART CATH O2 SATURATION & CARDIAC OUTPUT Right 01/18/2018    Procedure: Right Heart Catheterization;  Surgeon: Marlaine Hind, MD;  Location: [facility] CATH;  Service: Cardiology   ??? PR UPPER GI ENDOSCOPY,CTRL BLEED N/A 03/05/2019    Procedure: UGI ENDOSCOPY; WITH CONTROL OF BLEEDING, ANY METHOD;  Surgeon: Andrey Farmer, MD;  Location: [facility]URES [facility];  Service: Gastroenterology   ??? PR UPPER GI ENDOSCOPY,CTRL BLEED  04/04/2019    Procedure: UGI ENDOSCOPY; WITH CONTROL OF BLEEDING, ANY METHOD;  Surgeon: Janyth Pupa, MD;  Location: [facility]URES [facility];  Service: Gastroenterology   ??? PR UPPER GI ENDOSCOPY,CTRL BLEED N/A 05/23/2019    Procedure: UGI ENDOSCOPY; WITH CONTROL OF BLEEDING, ANY METHOD;  Surgeon: Janyth Pupa, MD;  Location: GI PROCEDURES [facility];  Service: Gastroenterology   ??? PR UPPER GI ENDOSCOPY,CTRL BLEED N/A 06/13/2019    Procedure: UGI ENDOSCOPY; WITH CONTROL OF BLEEDING, ANY METHOD;  Surgeon: Annie Paras, MD;  Location: [facility];  Service: Gastroenterology   ??? PR UPPER GI ENDOSCOPY,CTRL BLEED N/A 07/11/2019    Procedure: UGI ENDOSCOPY; WITH CONTROL OF BLEEDING, ANY METHOD;  Surgeon: Annie Paras, MD;  Location: GI PROC[facility]URES [facility];  Service: Gastroenterology   ??? PR UPPER GI ENDOSCOPY,CTRL BLEED N/A 09/16/2019    Procedure: UGI ENDOSCOPY; WITH CONTROL OF BLEEDING, ANY METHOD;  Surgeon: Beverly Milch, MD;  Location: [facility]URES [facility];  Service: Gastrointestinal   ??? PR UPPER GI ENDOSCOPY,CTRL BLEED N/A 10/07/2019    Procedure: UGI ENDOSCOPY; WITH CONTROL OF BLEEDING, ANY METHOD;  Surgeon: Pia Mau, MD;  Location: [facility]URES [facility];  Service: Gastroenterology   ??? PR UPPER GI ENDOSCOPY,CTRL BLEED N/A 01/02/2020 Procedure: UGI ENDOSCOPY; WITH CONTROL OF BLEEDING, ANY METHOD;  Surgeon: Janyth Pupa, MD;  Location: [facility] PROC[facility]URES [facility];  Service: Gastroenterology   ??? PR UPPER GI ENDOSCOPY,CTRL BLEED N/A 04/09/2020    Procedure: UGI ENDOSCOPY; WITH CONTROL OF BLEEDING, ANY METHOD;  Surgeon: Janyth Pupa, MD;  Location: GI PROC[facility]URES [facility];  Service: Gastroenterology   ??? PR UPPER GI ENDOSCOPY,DIAGNOSIS N/A 03/03/2019    Procedure: UGI ENDO, INCLUDE ESOPHAGUS, STOMACH, & DUODENUM &/OR JEJUNUM; DX W/WO COLLECTION SPECIMN, BY BRUSH OR WASH;  Surgeon: Luanne Bras, MD;  Location: GI PROC[facility]URES [facility];  Service: Gastroenterology   ??? PR UPPER GI ENDOSCOPY,LIGAT VARIX N/A 01/02/2020    Procedure: UGI ENDO; Everlene Balls LIG ESOPH &/OR GASTRIC VARICES;  Surgeon: Janyth Pupa, MD;  Location: [facility]URES [facility];  Service: Gastroenterology   ??? PR UPPER GI ENDOSCOPY,LIGAT VARIX N/A 04/09/2020    Procedure: UGI ENDO; W/BAND LIG ESOPH &/OR GASTRIC VARICES;  Surgeon: Janyth Pupa, MD;  Location: GI  PROC[facility]URES [facility];  Service: Gastroenterology   ??? PR UPPER GI ENDOSCOPY,LIGAT VARIX N/A 06/18/2020    Procedure: UGI ENDO; Everlene Balls LIG ESOPH &/OR GASTRIC VARICES;  Surgeon: Janyth Pupa, MD;  Location: GI PROCEDURES [facility];  Service: Gastroenterology   ??? SALPINGOOPHORECTOMY       family history is not on file.  Social History     Socioeconomic History   ??? Marital status: Married     Spouse name: Not on file   ??? Number of children: Not on file   ??? Years of education: Not on file   ??? Highest education level: Not on file   Occupational History   ??? Not on file   Tobacco Use   ??? Smoking status: Never Smoker   ??? Smokeless tobacco: Never Used   Vaping Use   ??? Vaping Use: Never used   Substance and Sexual Activity   ??? Alcohol use: No   ??? Drug use: No   ??? Sexual activity: Not on file   Other Topics Concern   ??? Not on file   Social History Narrative   ??? Not on file Social Determinants of Health     Financial Resource Strain: Low Risk    ??? Difficulty of Paying Living Expenses: Not hard at all   Food Insecurity: No Food Insecurity   ??? Worried About Programme researcher, broadcasting/film/video in the Last Year: Never true   ??? Ran Out of Food in the Last Year: Never true   Transportation Needs: No Transportation Needs   ??? Lack of Transportation (Medical): No   ??? Lack of Transportation (Non-Medical): No   Physical Activity: Not on file   Stress: Not on file   Social Connections: Not on file        Code Status:  Full Code    Review of Systems   Reason unable to perform ROS: Unable to obtain accurate ROS from patient as she is here for altered mental status secondary to hepatic encephalopathy.        Objective:    Temp:  [36.4 ??C (97.5 ??F)-36.6 ??C (97.9 ??F)] 36.6 ??C (97.9 ??F)  Heart Rate:  [69-83] 69  SpO2 Pulse:  [71] 71  Resp:  [16-28] 20  BP: (95-145)/(45-58) 95/50  MAP (mmHg):  [65-73] 65  SpO2:  [93 %-100 %] 93 %  No intake/output data recorded.    Physical Exam  Constitutional:       General: She is not in acute distress.     Appearance: She is ill-appearing and toxic-appearing.      Comments: Drowsy, but awakens to verbal stimuli, able to answer some questions with slowed response.   HENT:      Head: Normocephalic and atraumatic.      Mouth/Throat:      Mouth: Mucous membranes are moist.      Pharynx: Oropharynx is clear. No oropharyngeal exudate.   Eyes:      General: Scleral icterus present.   Cardiovascular:      Rate and Rhythm: Normal rate and regular rhythm.      Pulses: Normal pulses.      Heart sounds: Normal heart sounds. No murmur heard.  No friction rub. No gallop.    Pulmonary:      Effort: Pulmonary effort is normal. No respiratory distress.      Breath sounds: Normal breath sounds. No wheezing, rhonchi or rales.   Abdominal:      General: Bowel sounds are normal. There  is distension (Mild abdominal distention).      Palpations: Abdomen is soft.      Tenderness: There is no abdominal tenderness. There is no guarding or rebound.   Skin:     Coloration: Skin is jaundiced (Patient with scleral icterus, jaundiced mucous membranes).   Neurological:      General: No focal deficit present.      Comments: Unable to fully assess neurological function, patient is drowsy, awakens to verbal stimuli, is oriented to self.   Psychiatric:      Comments: Unable to assess          Labs:    Data Review:  All lab results last 24 hours:    Recent Results (from the past 24 hour(s))   Comprehensive Metabolic Panel    Collection Time: 11/14/20  3:26 AM   Result Value Ref Range    Sodium 141 135 - 145 mmol/L    Potassium 3.3 (L) 3.5 - 5.0 mmol/L    Chloride 108 (H) 98 - 107 mmol/L    Anion Gap 9 3 - 11 mmol/L    CO2 24.4 21.0 - 32.0 mmol/L    BUN 27 (H) 8 - 20 mg/dL    Creatinine 4.09 (H) 0.60 - 1.10 mg/dL    BUN/Creatinine Ratio 16     EGFR CKD-EPI Non-African American, Female 31 mL/min/1.37m2    EGFR CKD-EPI African American, Female 35 mL/min/1.52m2    Glucose 146 70 - 179 mg/dL    Calcium 8.4 (L) 8.5 - 10.1 mg/dL    Albumin 2.1 (L) 3.5 - 5.0 g/dL    Total Protein 4.8 (L) 6.0 - 8.0 g/dL    Total Bilirubin 2.9 (H) 0.3 - 1.2 mg/dL    AST 39 15 - 40 U/L    ALT 30 12 - 78 U/L    Alkaline Phosphatase 100 46 - 116 U/L   Ammonia    Collection Time: 11/14/20  3:26 AM   Result Value Ref Range    Ammonia 45 (H) 11 - 32 umol/L   PT-INR    Collection Time: 11/14/20  3:26 AM   Result Value Ref Range    PT 14.7 (H) 9.1 - 11.0 sec    INR 1.45 <=5.00   LDH, Lactate dehydrogenase    Collection Time: 11/14/20  3:26 AM   Result Value Ref Range    LDH 292 (H) 120 - 246 U/L   CBC w/ Differential    Collection Time: 11/14/20  3:27 AM   Result Value Ref Range    WBC 3.5 (L) 4.0 - 10.5 10*9/L    RBC 2.98 (L) 3.80 - 5.10 10*12/L    HGB 9.7 (L) 11.5 - 15.0 g/dL    HCT 81.1 (L) 91.4 - 44.0 %    MCV 101.7 (H) 80.0 - 98.0 fL    MCH 32.6 27.0 - 34.0 pg    MCHC 32.0 32.0 - 36.0 g/dL    RDW 78.2 (H) 95.6 - 14.5 %    MPV 12.0 (H) 7.4 - 10.4 fL Platelet 53 (L) 140 - 415 10*9/L    Neutrophils % 60.0 %    Lymphocytes % 24.9 %    Monocytes % 12.3 %    Eosinophils % 1.7 %    Basophils % 1.1 %    Absolute Neutrophils 2.1 1.8 - 7.8 10*9/L    Absolute Lymphocytes 0.9 0.7 - 4.5 10*9/L    Absolute Monocytes 0.4 0.1 - 1.0 10*9/L    Absolute Eosinophils  0.1 0.0 - 0.4 10*9/L    Absolute Basophils 0.0 0.0 - 0.2 10*9/L   Urinalysis with Culture Reflex    Collection Time: 11/14/20  6:38 AM    Specimen: Catheterized-In and Out Catheter; Urine   Result Value Ref Range    Color, UA Light Yellow     Clarity, UA Clear Clear    Specific Gravity, UA 1.010 1.010 - 1.025    pH, UA 5.5 5.0 - 8.0    Leukocyte Esterase, UA Negative Negative    Nitrite, UA Negative Negative    Protein, UA Negative Negative    Glucose, UA Negative Negative    Ketones, UA Negative Negative    Urobilinogen, UA 0.2 mg/dL 0.1 - 1.0 mg/dL    Bilirubin, UA Negative Negative    Blood, UA Trace (A) Negative       Imaging:  XR Chest Portable    Result Date: 11/14/2020  CLINICAL DATA:  Altered mental status with weakness today.     EXAM: PORTABLE CHEST 1 VIEW     COMPARISON:  07/23/2020     FINDINGS: Right subclavian Port-A-Cath unchanged. Lungs are hypoinflated with mild linear atelectasis over the right mid to upper lung. No effusion. Cardiomediastinal silhouette and remainder of the exam is unchanged.         Hypoinflation with mild linear atelectasis over the right mid to upper lung.         Electronically Signed   By: Elberta Fortis M.D.   On: 11/14/2020 08:24         CT Abdomen Pelvis with IV Contrast Only    Result Date: 11/14/2020  CLINICAL DATA:  Elevated serum ammonia level, acute nonlocalized abdominal pain     EXAM: CT ABDOMEN AND PELVIS WITH CONTRAST     TECHNIQUE: Multidetector CT imaging of the abdomen and pelvis was performed using the standard protocol following bolus administration of intravenous contrast.     CONTRAST:  80 cc Omnipaque 300     COMPARISON:  05/16/2016     FINDINGS: Lower chest: The visualized lung bases are clear bilaterally. The visualized heart and pericardium are unremarkable.     Hepatobiliary: Right hepatic vein to right portal vein TIPS is in place. There is no significant opacification of the left portal vein, expected following placement of a functional tips. The shunt extends into the main portal vein at the bifurcation. The liver is small and the liver contour is mildly nodular in keeping with changes of cirrhosis. Small congenital or acquired cyst noted within the inferior right hepatic lobe. Calcification within the a terminal superior mesenteric vein, portal vein, and splenic vein relate to portal venous hypertension. These vessels are patent, however.     Status post cholecystectomy. No intra or extrahepatic biliary ductal dilation.     Pancreas: Unremarkable     Spleen: The spleen is moderately enlarged measuring 17.9 cm in greatest dimension. No intrasplenic lesions are identified.     Adrenals/Urinary Tract: The left kidney is mildly malrotated. The kidneys are otherwise unremarkable. Adrenal glands are unremarkable. The bladder is unremarkable.     Stomach/Bowel: Stomach, small bowel, and large bowel are unremarkable. Appendix absent. No free intraperitoneal gas. Trace perihepatic ascites.     Vascular/Lymphatic: A large retroperitoneal portosystemic collateral is seen within the aortocaval region arising from the splenic vein which has undergone coil embolization but remains patent. The point of communication with the systemic vasculature is not clearly identified on this exam. Moderate aorto iliac atherosclerotic calcification.  No aortic aneurysm. No pathologic adenopathy within the abdomen and pelvis.     Reproductive: Status post hysterectomy. No adnexal masses.     Other: Moderate fat containing umbilical hernia. There is mild infiltration of the fat within the hernia sac possibly related to incarceration. Rectum unremarkable.     Musculoskeletal: No acute bone abnormality. Left hip bipolar hemiarthroplasty has been performed.         Cirrhosis with evidence of portal venous hypertension. Interval TIPS placement since prior examination of 05/16/2016. Stable splenomegaly. Persistent retroperitoneal portosystemic collateral, possibly communicating with the inferior vena cava with evidence of prior attempts at embolization utilizing endovascular plugs.     Moderate fat containing umbilical hernia with infiltration of the herniated fat possibly related to incarceration. Correlation with clinical examination is recommended.     Aortic Atherosclerosis (ICD10-I70.0).         Electronically Signed   By: Helyn Numbers MD   On: 11/14/2020 05:12         CT Head Wo Contrast Cervical Spine Wo Contrast    Result Date: 11/14/2020  CLINICAL DATA:  Generalized weakness, elevated serum ammonia level, encephalopathy     EXAM: CT HEAD WITHOUT CONTRAST     CT CERVICAL SPINE WITHOUT CONTRAST     TECHNIQUE: Multidetector CT imaging of the head and cervical spine was performed following the standard protocol without intravenous contrast. Multiplanar CT image reconstructions of the cervical spine were also generated.     COMPARISON:  None.     FINDINGS: CT HEAD FINDINGS     Brain: Normal anatomic configuration. No abnormal intra or extra-axial mass lesion or fluid collection. No abnormal mass effect or midline shift. No evidence of acute intracranial hemorrhage or infarct. Ventricular size is normal. Cerebellum unremarkable.     Vascular: Moderate vascular calcifications are seen within the carotid siphons. No asymmetric hyperdense vasculature is seen at the skull base     Skull: Intact     Sinuses/Orbits: Paranasal sinuses are clear. Orbits are unremarkable.     Other: Mastoid air cells and middle ear cavities are clear.     CT CERVICAL SPINE FINDINGS     Alignment: Normal.     Skull base and vertebrae: No acute fracture. No primary bone lesion or focal pathologic process.     Soft tissues and spinal canal: No prevertebral fluid or swelling. No visible canal hematoma.     Disc levels: Vertebral body heights and intervertebral disc heights are preserved. The prevertebral soft tissues are not thickened on sagittal reformats. Review of the axial images demonstrates no significant neuroforaminal narrowing or canal stenosis.     Upper chest: Unremarkable     Other: None         No acute intracranial abnormality.  No calvarial fracture.     No acute fracture or listhesis of the cervical spine.         Electronically Signed   By: Helyn Numbers MD   On: 11/14/2020 03:06          Elenora Fender, DO   11/14/2020

## 2020-11-14 NOTE — Unmapped (Signed)
Late Entry    11/14/2020 @ 0100   On call page received from [facility] spouse Casimiro Needle. Reports patient has continued to decline and has fallen multiple times. He discussed taking her to [facility] facility.in [place]. Advised that given current state of health calling 911 for assistance may be best. Casimiro Needle insistent that a [facility] facility is needed so that we can get her a liver now that hers has failed. Spouse to take [facility] to [facility] facility in [place] via car.     11/14/2020 @ 0915   On call page received from [facility] - spoke with attending physician who states patient remains altered despite spouse doubling lactulose X 24 hours at home. She is now having multiple bm's. All testing wnl for patient @ this time. Provided contact # for transfer center. Call placed and spoke with Dr. Octaviano Glow , update him on patient status in anticipation of transfer center call.

## 2020-11-14 NOTE — Unmapped (Signed)
Medicine Access Physician (MAP): Transfer Center Request Note    Requesting Physician: Dr. Dellie Burns     Requesting [facility]: [facility]    Requesting Service: ED    Reason for Transfer: hepatology, liver transplant list    Brief Hospital Course:        This is a 65 year old woman on the liver transplant list and followed in hepatology clinic here with NAFLD cirrhosis, GAVE, chronic blood loss anemia receiving intermittent transfusions who has had recurrent problems with hepatic encephalopathy.  On Saturday, 2/26 the husband found the patient's on the floor unresponsive.  He called a liver transplant clinic and was told to give additional lactulose.  The patient did experience some improvement but is still not herself and he brought her to the emergency department this morning at [facility] for further evaluation and treatment.  There her labs do not demonstrate any acute abnormality and she had an unremarkable head CT scan.  The patient is experiencing some diarrhea and has had some slight improvement since being given the double dose of lactulose by her husband, but is still not back to her baseline.  Her vital signs are normal with blood pressure 117/45, temperature 97.6, heart rate 76, saturation 93% on room air.  Currently there are no signs of infection or GI bleeding.      Transfer is requested after the outside hospital emergency physician had discussed the case with Dr. Octaviano Glow here in the hepatology center.  Given that the patient has been considered for transplant, transfer is appropriate.  I explained that currently we do not have beds and the patient will be placed on a waiting list.    COVID19 Concerns:      Plan Upon Arrival:   1. Hepatology consultation    Bed Type: Floor  Are there any clinical exclusion criteria as of the current date, as defined in the [facility]' Clinical Capability Matrix, to providing the patient's clinical care at [facility]? If yes, please indicate the exclusion criteria: YES, GI Hepatology      Accepting Service: MDU or Appropriate for medicine services as determined by [facility] for regionalization.    Imaging Needed:  Yes.    [employer] Affiliate:  No.  Reminded facility to provide imaging with transfer paperwork.  (What is this?)    Please check under Care Everywhere and/or External Records in the patient's Media Tab to see if a discharge summary has been previously sent electronically.      Patient accepted from an emergency departement and therefore back transfer was not discussed

## 2020-11-14 NOTE — Unmapped (Signed)
Chief complaint:   Chief Complaint   Patient presents with   ??? Weakness       History is obtained from family and patient    HPI: This is a 65 y.o. female with past medical history of chronic liver failure, cirrhosis, currently on transplant list at [facility] presenting to the ED with altered mental status and multiple falls.  Husband reports the patient has been very unsteady on her feet getting progressively worse over the past several days.  He reports at least 4 falls over the last 24 hours.  Husband and patient deny hitting head.  Patient is also getting progressively more confused and her answers take longer to give.  Husband stated that patient had a blank stare for some time today and he knows this is when her is high.  Has been reports that she has been compliant with her lactulose and he called the on call nurse line and was told to increase patient's lactulose.  He states he did do this.  Patient is now having extreme diarrhea and is incontinent.  Patient is oriented x3, however her answers are sluggish and delayed.  She is unable to ambulate independently.    Review of Systems   Constitutional: Negative for chills and fever.   HENT: Negative for congestion.    Respiratory: Negative for cough and shortness of breath.    Cardiovascular: Negative for chest pain.   Gastrointestinal: Negative for abdominal pain.   Genitourinary: Negative for dysuria.   Musculoskeletal: Negative for back pain, myalgias and neck pain.   Skin: Negative for rash.   Neurological: Negative for weakness, light-headedness and numbness.   All other systems reviewed and are negative.      Allergies: is allergic to ferumoxytol, definity [perflutren lipid microspheres], other, statins-hmg-coa reductase inhibitors, and tramadol hcl.  Medications: has a current medication list which includes the following long-term medication(s): bumetanide, lantus solostar u-100 insulin, levothyroxine, rosuvastatin, sertraline, and spironolactone.  PMHx:  has a past medical history of Cirrhosis (CMS-HCC), Depression, Diabetes mellitus (CMS-HCC), Fibromyalgia, GAVE (gastric antral vascular ectasia), History of transfusion (04/2019), HTN (hypertension), Hypercholesterolemia, Hypothyroid, NAFLD (nonalcoholic fatty liver disease), and Osteoporosis.  PSHx:  has a past surgical history that includes Appendectomy; Cholecystectomy; Salpingoophorectomy; pr right heart cath o2 saturation & cardiac output (Right, 01/18/2018); IR Tips (07/08/2018); pr upper gi endoscopy,diagnosis (N/A, 03/03/2019); pr upper gi endoscopy,ctrl bleed (N/A, 03/05/2019); pr upper gi endoscopy,ctrl bleed (04/04/2019); pr upper gi endoscopy,ctrl bleed (N/A, 05/23/2019); pr upper gi endoscopy,ctrl bleed (N/A, 06/13/2019); pr upper gi endoscopy,ctrl bleed (N/A, 07/11/2019); pr upper gi endoscopy,ctrl bleed (N/A, 09/16/2019); pr upper gi endoscopy,ctrl bleed (N/A, 10/07/2019); pr colsc flexible w/control bleeding any method (N/A, 10/17/2019); pr upper gi endoscopy,ligat varix (N/A, 01/02/2020); pr upper gi endoscopy,ctrl bleed (N/A, 01/02/2020); pr upper gi endoscopy,ligat varix (N/A, 04/09/2020); pr upper gi endoscopy,ctrl bleed (N/A, 04/09/2020); and pr upper gi endoscopy,ligat varix (N/A, 06/18/2020).  SocHx:  reports that she has never smoked. She has never used smokeless tobacco. She reports that she does not drink alcohol and does not use drugs.  Allergies, Medications, Medical, Surgical, and Social History were reviewed as documented above.    Physical Exam  Vitals and nursing note reviewed.   Constitutional:       General: She is not in acute distress.  HENT:      Head: Normocephalic and atraumatic.      Nose: Nose normal.      Mouth/Throat:      Mouth: Mucous membranes are moist.  Eyes:      General: Scleral icterus present.      Extraocular Movements: Extraocular movements intact.      Pupils: Pupils are equal, round, and reactive to light.   Cardiovascular:      Rate and Rhythm: Normal rate and regular rhythm. Pulses: Normal pulses.      Heart sounds: Normal heart sounds.   Pulmonary:      Effort: Pulmonary effort is normal. No respiratory distress.      Breath sounds: Normal breath sounds. No wheezing, rhonchi or rales.   Abdominal:      General: Abdomen is flat.      Palpations: Abdomen is soft.      Tenderness: There is no abdominal tenderness. There is no guarding or rebound.   Musculoskeletal:         General: No deformity.      Right lower leg: No edema.      Left lower leg: No edema.   Lymphadenopathy:      Cervical: No cervical adenopathy.   Skin:     General: Skin is warm and dry.      Capillary Refill: Capillary refill takes less than 2 seconds.      Coloration: Skin is jaundiced.      Findings: No rash.   Neurological:      General: No focal deficit present.      Mental Status: She is alert and oriented to person, place, and time.      Cranial Nerves: No cranial nerve deficit.      Sensory: No sensory deficit.      Motor: No weakness.      Coordination: Coordination abnormal.      Gait: Gait abnormal.   Psychiatric:         Mood and Affect: Mood normal.         Behavior: Behavior normal.       Vitals: BP 103/46  - Pulse 90  - Temp 36.6 ??C (97.9 ??F) (Oral)  - Resp 18  - LMP  (LMP Unknown)  - SpO2 96%     ED Results  Results for orders placed or performed during the hospital encounter of 11/14/20   Comprehensive Metabolic Panel   Result Value Ref Range    Sodium 141 135 - 145 mmol/L    Potassium 3.3 (L) 3.5 - 5.0 mmol/L    Chloride 108 (H) 98 - 107 mmol/L    Anion Gap 9 3 - 11 mmol/L    CO2 24.4 21.0 - 32.0 mmol/L    BUN 27 (H) 8 - 20 mg/dL    Creatinine 6.29 (H) 0.60 - 1.10 mg/dL    BUN/Creatinine Ratio 16     EGFR CKD-EPI Non-African American, Female 31 mL/min/1.48m2    EGFR CKD-EPI African American, Female 35 mL/min/1.79m2    Glucose 146 70 - 179 mg/dL    Calcium 8.4 (L) 8.5 - 10.1 mg/dL    Albumin 2.1 (L) 3.5 - 5.0 g/dL    Total Protein 4.8 (L) 6.0 - 8.0 g/dL    Total Bilirubin 2.9 (H) 0.3 - 1.2 mg/dL    AST 39 15 - 40 U/L    ALT 30 12 - 78 U/L    Alkaline Phosphatase 100 46 - 116 U/L   Ammonia   Result Value Ref Range    Ammonia 45 (H) 11 - 32 umol/L   PT-INR   Result Value Ref Range    PT 14.7 (H) 9.1 - 11.0 sec  INR 1.45 <=5.00   LDH, Lactate dehydrogenase   Result Value Ref Range    LDH 292 (H) 120 - 246 U/L   Urinalysis with Culture Reflex    Specimen: Catheterized-In and Out Catheter; Urine   Result Value Ref Range    Color, UA Light Yellow     Clarity, UA Clear Clear    Specific Gravity, UA 1.010 1.010 - 1.025    pH, UA 5.5 5.0 - 8.0    Leukocyte Esterase, UA Negative Negative    Nitrite, UA Negative Negative    Protein, UA Negative Negative    Glucose, UA Negative Negative    Ketones, UA Negative Negative    Urobilinogen, UA 0.2 mg/dL 0.1 - 1.0 mg/dL    Bilirubin, UA Negative Negative    Blood, UA Trace (A) Negative   Comprehensive Metabolic Panel   Result Value Ref Range    Sodium 140 135 - 145 mmol/L    Potassium 3.6 3.5 - 5.0 mmol/L    Chloride 108 (H) 98 - 107 mmol/L    Anion Gap 7 3 - 11 mmol/L    CO2 25.0 21.0 - 32.0 mmol/L    BUN 29 (H) 8 - 20 mg/dL    Creatinine 1.61 (H) 0.60 - 1.10 mg/dL    BUN/Creatinine Ratio 17     EGFR CKD-EPI Non-African American, Female 31 mL/min/1.36m2    EGFR CKD-EPI African American, Female 35 mL/min/1.39m2    Glucose 185 (H) 70 - 179 mg/dL    Calcium 7.8 (L) 8.5 - 10.1 mg/dL    Albumin 1.8 (L) 3.5 - 5.0 g/dL    Total Protein 4.2 (L) 6.0 - 8.0 g/dL    Total Bilirubin 2.1 (H) 0.3 - 1.2 mg/dL    AST 39 15 - 40 U/L    ALT 28 12 - 78 U/L    Alkaline Phosphatase 88 46 - 116 U/L   Ammonia   Result Value Ref Range    Ammonia 68 (H) 11 - 32 umol/L   Comprehensive Metabolic Panel   Result Value Ref Range    Sodium 137 135 - 145 mmol/L    Potassium 3.5 3.5 - 5.0 mmol/L    Chloride 106 98 - 107 mmol/L    Anion Gap 9 3 - 11 mmol/L    CO2 21.6 21.0 - 32.0 mmol/L    BUN 27 (H) 8 - 20 mg/dL    Creatinine 0.96 (H) 0.60 - 1.10 mg/dL    BUN/Creatinine Ratio 16     EGFR CKD-EPI Non-African American, Female 31 mL/min/1.40m2    EGFR CKD-EPI African American, Female 36 mL/min/1.64m2    Glucose 203 (H) 70 - 179 mg/dL    Calcium 7.1 (L) 8.5 - 10.1 mg/dL    Albumin 1.8 (L) 3.5 - 5.0 g/dL    Total Protein 4.2 (L) 6.0 - 8.0 g/dL    Total Bilirubin 2.6 (H) 0.3 - 1.2 mg/dL    AST 43 (H) 15 - 40 U/L    ALT 29 12 - 78 U/L    Alkaline Phosphatase 87 46 - 116 U/L   Ammonia   Result Value Ref Range    Ammonia 96 (H) 11 - 32 umol/L   POCT Glucose   Result Value Ref Range    Glucose, POC 261 (H) 70 - 105 mg/dL   POCT Glucose   Result Value Ref Range    Glucose, POC 148 (H) 70 - 105 mg/dL   POCT Glucose   Result Value Ref  Range    Glucose, POC 387 (H) 70 - 105 mg/dL   POCT Glucose   Result Value Ref Range    Glucose, POC 187 (H) 70 - 105 mg/dL   CBC w/ Differential   Result Value Ref Range    WBC 3.5 (L) 4.0 - 10.5 10*9/L    RBC 2.98 (L) 3.80 - 5.10 10*12/L    HGB 9.7 (L) 11.5 - 15.0 g/dL    HCT 16.1 (L) 09.6 - 44.0 %    MCV 101.7 (H) 80.0 - 98.0 fL    MCH 32.6 27.0 - 34.0 pg    MCHC 32.0 32.0 - 36.0 g/dL    RDW 04.5 (H) 40.9 - 14.5 %    MPV 12.0 (H) 7.4 - 10.4 fL    Platelet 53 (L) 140 - 415 10*9/L    Neutrophils % 60.0 %    Lymphocytes % 24.9 %    Monocytes % 12.3 %    Eosinophils % 1.7 %    Basophils % 1.1 %    Absolute Neutrophils 2.1 1.8 - 7.8 10*9/L    Absolute Lymphocytes 0.9 0.7 - 4.5 10*9/L    Absolute Monocytes 0.4 0.1 - 1.0 10*9/L    Absolute Eosinophils 0.1 0.0 - 0.4 10*9/L    Absolute Basophils 0.0 0.0 - 0.2 10*9/L   CBC w/ Differential   Result Value Ref Range    WBC 2.0 (L) 4.0 - 10.5 10*9/L    RBC 2.54 (L) 3.80 - 5.10 10*12/L    HGB 8.1 (L) 11.5 - 15.0 g/dL    HCT 81.1 (L) 91.4 - 44.0 %    MCV 103.5 (H) 80.0 - 98.0 fL    MCH 31.9 27.0 - 34.0 pg    MCHC 30.8 (L) 32.0 - 36.0 g/dL    RDW 78.2 (H) 95.6 - 14.5 %    MPV 12.4 (H) 7.4 - 10.4 fL    Platelet 42 (L) 140 - 415 10*9/L    Neutrophils % 58.5 %    Lymphocytes % 24.1 %    Monocytes % 14.9 %    Eosinophils % 1.5 %    Basophils % 0.5 %    Absolute Neutrophils 1.1 (L) 1.8 - 7.8 10*9/L    Absolute Lymphocytes 0.5 (L) 0.7 - 4.5 10*9/L    Absolute Monocytes 0.3 0.1 - 1.0 10*9/L    Absolute Eosinophils 0.0 0.0 - 0.4 10*9/L    Absolute Basophils 0.0 0.0 - 0.2 10*9/L   CBC w/ Differential   Result Value Ref Range    WBC 2.7 (L) 4.0 - 10.5 10*9/L    RBC 2.67 (L) 3.80 - 5.10 10*12/L    HGB 8.6 (L) 11.5 - 15.0 g/dL    HCT 21.3 (L) 08.6 - 44.0 %    MCV 101.5 (H) 80.0 - 98.0 fL    MCH 32.2 27.0 - 34.0 pg    MCHC 31.7 (L) 32.0 - 36.0 g/dL    RDW 57.8 (H) 46.9 - 14.5 %    MPV 12.1 (H) 7.4 - 10.4 fL    Platelet 43 (L) 140 - 415 10*9/L    Neutrophils % 61.0 %    Lymphocytes % 21.3 %    Monocytes % 14.0 %    Eosinophils % 2.2 %    Basophils % 1.1 %    Absolute Neutrophils 1.7 (L) 1.8 - 7.8 10*9/L    Absolute Lymphocytes 0.6 (L) 0.7 - 4.5 10*9/L    Absolute Monocytes 0.4 0.1 - 1.0  10*9/L    Absolute Eosinophils 0.1 0.0 - 0.4 10*9/L    Absolute Basophils 0.0 0.0 - 0.2 10*9/L     No results found.    Medications Administered:   Medications   sodium chloride 0.45% (1/2 NS) infusion (50 mL/hr Intravenous New Bag 11/16/20 0051)   bumetanide (BUMEX) tablet 2 mg (2 mg Oral Given 11/16/20 0643)   busPIRone (BUSPAR) tablet 10 mg (10 mg Oral Given 11/16/20 0825)   ferrous sulfate tablet 325 mg (325 mg Oral Given 11/16/20 0825)   levothyroxine (SYNTHROID) tablet 25 mcg (25 mcg Oral Given 11/16/20 0643)   pantoprazole (PROTONIX) EC tablet 40 mg (40 mg Oral Given 11/16/20 0825)   rifAXIMin (XIFAXAN) tablet 550 mg (550 mg Oral Given 11/16/20 0825)   insulin regular (HumuLIN,NovoLIN) injection 0-9 Units (2 Units Subcutaneous Given 11/16/20 0838)   lactulose (CHRONULAC) oral solution (30 mL cup) (has no administration in time range)   sodium chloride 0.9% (NS) bolus 1,000 mL (0 mL Intravenous Stopped 11/14/20 0532)   lactulose (CHRONULAC) oral solution (30 mL cup) (20 g Oral Given 11/14/20 0432)   iohexoL (OMNIPAQUE) 300 mg iodine/mL solution 80 mL (80 mL Intravenous Given 11/14/20 0455)       Procedures The results of any testing as well as any treatments done today in the Emergency Department were communicated to the patient and/or their family/caregiver (if present)    Medical Decision Making:    Patient is a 65 year old female with hepatic encephalopathy.  Patient is end-stage liver disease with a MELD of 20.  Patient's vital signs are stable.  Patient does have scleral icterus and jaundice.  Her responses are slowed but she is oriented x3.  Laboratory studies reveal elevated ammonia, but it is below her last level.  Patient has had chronic anemia, however patient is above her baseline as well.  Laboratory studies are all either at or below patient's baseline or within normal limits.  Despite this patient's appearance is sluggish, altered.  CT head is negative.  CT abdomen pelvis ordered due to not having a clear reason for altered mental status with these laboratory improved values. CT abd pelvis not show any clear cause of patient's altered mental status.  Urinalysis is pending at the time of signout.  Patient signed out to Dr. Micheline Chapman at shift change.    Diagnosis:   1. Encephalopathy      Condition: Guarded  Disposition: Pending      This chart has been completed using Engineer, civil (consulting) software, and while attempts have been made to ensure accuracy, certain words and phrases may not be transcribed as intended.        Daine Gip, DO  11/16/20 1120

## 2020-11-14 NOTE — Unmapped (Signed)
Pt has increased weakness today. Pt has increased ammonia levels per pt spouse. Pt is on the liver transplant list and her husband was advised to increase her lactulose from 160 ml to 240 ml when she gets like this. Pt is alert and oriented. No pain at this time.

## 2020-11-14 NOTE — Unmapped (Signed)
Emergency Department Provider Note        ED Clinical Impression     Final diagnoses:   Encephalopathy (Primary)       ED Assessment/Plan       Condition: Stable  Disposition: Transfer    This chart has been completed using Dragon Medical Dictation software, and while attempts have been made to ensure accuracy, certain words and phrases may not be transcribed as intended.     History     Chief Complaint   Patient presents with   ??? Weakness     I resumed care of patient at shift change from Dr. Jim Like, see her chart for complete HPI, ROS and exam at arrival.  Briefly this is a patient brought in for altered mental status.  When I speak with husband this morning all of history is provided by him.  He states yesterday morning he found patient on the floor and then throughout the day yesterday she was just staring and really could not speak.  She has a history of end-stage liver disease and has been on a transplant list at [facility] for years.  He spoke with the transplant coordinator yesterday that recommended getting additional lactulose as she has a history of the same whenever her ammonia levels increase, states he basically doubled the dose yesterday.      Weakness      Past Medical History:   Diagnosis Date   ??? Cirrhosis (CMS-HCC)    ??? Depression    ??? Diabetes mellitus (CMS-HCC)    ??? Fibromyalgia    ??? GAVE (gastric antral vascular ectasia)    ??? History of transfusion 04/2019    reports frequent blood tranfusions   ??? HTN (hypertension)    ??? Hypercholesterolemia    ??? Hypothyroid    ??? NAFLD (nonalcoholic fatty liver disease)    ??? Osteoporosis        Past Surgical History:   Procedure Laterality Date   ??? APPENDECTOMY     ??? CHOLECYSTECTOMY     ??? IR TIPS  07/08/2018    IR TIPS 07/08/2018 Soledad Gerlach, MD IMG VIR [facility]   ??? PR COLSC FLEXIBLE W/CONTROL BLEEDING ANY METHOD N/A 10/17/2019    Procedure: COLONOSCOPY, FLEXIBLE, PROXIMAL TO SPLENIC FLEXURE; DX, W/CONTROL OF BLEEDING;  Surgeon: Jules Husbands, MD; Location: [facility]  Service: Gastroenterology   ??? PR RIGHT HEART CATH O2 SATURATION & CARDIAC OUTPUT Right 01/18/2018    Procedure: Right Heart Catheterization;  Surgeon: Marlaine Hind, MD;  Location: [facility] CATH;  Service: Cardiology   ??? PR UPPER GI ENDOSCOPY,CTRL BLEED N/A 03/05/2019    Procedure: UGI ENDOSCOPY; WITH CONTROL OF BLEEDING, ANY METHOD;  Surgeon: Andrey Farmer, MD;  Location: [facility];  Service: Gastroenterology   ??? PR UPPER GI ENDOSCOPY,CTRL BLEED  04/04/2019    Procedure: UGI ENDOSCOPY; WITH CONTROL OF BLEEDING, ANY METHOD;  Surgeon: Janyth Pupa, MD;  Location: [facility];  Service: Gastroenterology   ??? PR UPPER GI ENDOSCOPY,CTRL BLEED N/A 05/23/2019    Procedure: UGI ENDOSCOPY; WITH CONTROL OF BLEEDING, ANY METHOD;  Surgeon: Janyth Pupa, MD;  Location: [facility];  Service: Gastroenterology   ??? PR UPPER GI ENDOSCOPY,CTRL BLEED N/A 06/13/2019    Procedure: UGI ENDOSCOPY; WITH CONTROL OF BLEEDING, ANY METHOD;  Surgeon: Annie Paras, MD;  Location: [facility];  Service: Gastroenterology   ??? PR UPPER GI ENDOSCOPY,CTRL BLEED N/A 07/11/2019  Procedure: UGI ENDOSCOPY; WITH CONTROL OF BLEEDING, ANY METHOD;  Surgeon: Annie Paras, MD;  Location: [facility];  Service: Gastroenterology   ??? PR UPPER GI ENDOSCOPY,CTRL BLEED N/A 09/16/2019    Procedure: UGI ENDOSCOPY; WITH CONTROL OF BLEEDING, ANY METHOD;  Surgeon: Beverly Milch, MD;  Location: [facility];  Service: Gastrointestinal   ??? PR UPPER GI ENDOSCOPY,CTRL BLEED N/A 10/07/2019    Procedure: UGI ENDOSCOPY; WITH CONTROL OF BLEEDING, ANY METHOD;  Surgeon: Pia Mau, MD;  Location: [facility];  Service: Gastroenterology   ??? PR UPPER GI ENDOSCOPY,CTRL BLEED N/A 01/02/2020    Procedure: UGI ENDOSCOPY; WITH CONTROL OF BLEEDING, ANY METHOD;  Surgeon: Janyth Pupa, MD; Location: [facility];  Service: Gastroenterology   ??? PR UPPER GI ENDOSCOPY,CTRL BLEED N/A 04/09/2020    Procedure: UGI ENDOSCOPY; WITH CONTROL OF BLEEDING, ANY METHOD;  Surgeon: Janyth Pupa, MD;  Location: [facility];  Service: Gastroenterology   ??? PR UPPER GI ENDOSCOPY,DIAGNOSIS N/A 03/03/2019    Procedure: UGI ENDO, INCLUDE ESOPHAGUS, STOMACH, & DUODENUM &/OR JEJUNUM; DX W/WO COLLECTION SPECIMN, BY BRUSH OR WASH;  Surgeon: Luanne Bras, MD;  Location: [facility];  Service: Gastroenterology   ??? PR UPPER GI ENDOSCOPY,LIGAT VARIX N/A 01/02/2020    Procedure: UGI ENDO; Everlene Balls LIG ESOPH &/OR GASTRIC VARICES;  Surgeon: Janyth Pupa, MD;  Location: [facility];  Service: Gastroenterology   ??? PR UPPER GI ENDOSCOPY,LIGAT VARIX N/A 04/09/2020    Procedure: UGI ENDO; W/BAND LIG ESOPH &/OR GASTRIC VARICES;  Surgeon: Janyth Pupa, MD;  Location: [facility];  Service: Gastroenterology   ??? PR UPPER GI ENDOSCOPY,LIGAT VARIX N/A 06/18/2020    Procedure: UGI ENDO; Everlene Balls LIG ESOPH &/OR GASTRIC VARICES;  Surgeon: Janyth Pupa, MD;  Location: [facility];  Service: Gastroenterology   ??? SALPINGOOPHORECTOMY         No family history on file.    Social History     Socioeconomic History   ??? Marital status: Married     Spouse name: Not on file   ??? Number of children: Not on file   ??? Years of education: Not on file   ??? Highest education level: Not on file   Occupational History   ??? Not on file   Tobacco Use   ??? Smoking status: Never Smoker   ??? Smokeless tobacco: Never Used   Vaping Use   ??? Vaping Use: Never used   Substance and Sexual Activity   ??? Alcohol use: No   ??? Drug use: No   ??? Sexual activity: Not on file   Other Topics Concern   ??? Not on file   Social History Narrative   ??? Not on file     Social Determinants of Health     Financial Resource Strain: Low Risk    ??? Difficulty of Paying Living Expenses: Not hard at all   Food Insecurity: No Food Insecurity   ??? Worried About Programme researcher, broadcasting/film/video in the Last Year: Never true   ??? Ran Out of Food in the Last Year: Never true   Transportation Needs: No Transportation Needs   ??? Lack of Transportation (Medical): No   ??? Lack of Transportation (Non-Medical): No   Physical Activity: Not on file   Stress: Not on file   Social Connections: Not on file       Review of Systems  Physical Exam     BP 118/56  - Pulse 72  - Temp 36.6 ??C (97.8 ??F) (Oral)  - Resp 20  - LMP  (LMP Unknown)  - SpO2 94%     Physical Exam    ED Course     Labs Reviewed   COMPREHENSIVE METABOLIC PANEL - Abnormal; Notable for the following components:       Result Value    Potassium 3.3 (*)     Chloride 108 (*)     BUN 27 (*)     Creatinine 1.73 (*)     Calcium 8.4 (*)     Albumin 2.1 (*)     Total Protein 4.8 (*)     Total Bilirubin 2.9 (*)     All other components within normal limits   AMMONIA - Abnormal; Notable for the following components:    Ammonia 45 (*)     All other components within normal limits   PROTIME-INR - Abnormal; Notable for the following components:    PT 14.7 (*)     All other components within normal limits    Narrative:     Therapeutic INR range for most indications is 2.00 - 3.00, but specific clinical scenarios such as mechanical heart valves may require a different target range.   LACTATE DEHYDROGENASE - Abnormal; Notable for the following components:    LDH 292 (*)     All other components within normal limits   URINALYSIS WITH CULTURE REFLEX - Abnormal; Notable for the following components:    Blood, UA Trace (*)     All other components within normal limits   CBC W/ AUTO DIFF - Abnormal; Notable for the following components:    WBC 3.5 (*)     RBC 2.98 (*)     HGB 9.7 (*)     HCT 30.3 (*)     MCV 101.7 (*)     RDW 18.2 (*)     MPV 12.0 (*)     Platelet 53 (*)     All other components within normal limits   CBC W/ DIFFERENTIAL    Narrative:     The following orders were created for panel order CBC w/ Differential.  Procedure                               Abnormality         Status                     ---------                               -----------         ------                     CBC w/ Differential[[id number]]         Abnormal            Final result                 Please view results for these tests on the individual orders.       XR Chest Portable   Final Result   Hypoinflation with mild linear atelectasis over the right mid to   upper lung.         Electronically Signed     By:  Elberta Fortis M.D.     On: 11/14/2020 08:24         CT Abdomen Pelvis with IV Contrast Only   Final Result   Cirrhosis with evidence of portal venous hypertension. Interval TIPS   placement since prior examination of 05/16/2016. Stable   splenomegaly. Persistent retroperitoneal portosystemic collateral,   possibly communicating with the inferior vena cava with evidence of   prior attempts at embolization utilizing endovascular plugs.      Moderate fat containing umbilical hernia with infiltration of the   herniated fat possibly related to incarceration. Correlation with   clinical examination is recommended.      Aortic Atherosclerosis (ICD10-I70.0).         Electronically Signed     By: Helyn Numbers MD     On: 11/14/2020 05:12         CT Head Wo Contrast Cervical Spine Wo Contrast   Final Result   No acute intracranial abnormality.  No calvarial fracture.      No acute fracture or listhesis of the cervical spine.         Electronically Signed     By: Helyn Numbers MD     On: 11/14/2020 03:06             MDM  Number of Diagnoses or Management Options  Encephalopathy  Diagnosis management comments: 8:32 AM  Unclear etiology of patient's encephalopathy; Did have mild elevation in her ammonia level; husband states she normally gets confused when levels in the 70s.  Did get an additional dose of lactulose here.  CT head no acute findings.  No acute etiology on her CT abdomen pelvis either. Chest x-ray not suggestive of pneumonia urinalysis does not suggest UTI.  I have placed a call out to the transplant coordinator at [facility] to further discuss    9:07 AM  I discussed with the transplant care coordinator; she is very familiar with patient.  Actually spoke with husband yesterday.  She recommends calling the pathology team who is on-call for [facility] Dr. Octaviano Glow to further discuss patient    9:54 AM  Dr. Octaviano Glow feels it will be appropriate for patient to be transferred to [facility] main campus for admission for encephalopathy; he advised to speak with the hospitalist service for admission.  I discussed with hospitalist Dr. Tresa Garter who agrees to accept patient for transfer. Did advise the wait for a bed to become available could be several days. I updated husband and daughter of this and they verbalized understanding.  I will ask our hospitalist to medically consult on patient while boarding in our emergency room.    There was mention of fat-containing umbilical hernia with possible incarceration on CT scan.  I evaluated this and on physical examination there is no redness warmth or tenderness over the umbilical hernia and it is completely reducible.    10:21 AM  I discussed patient with hospitalist Dr. Nolon Bussing here at [facility]; the hospitalist service here will be happy to come see patient for a consult        Amount and/or Complexity of Data Reviewed  Clinical lab tests: reviewed  Tests in the radiology section of CPT??: reviewed  Obtain history from someone other than the patient: yes  Review and summarize past medical records: yes  Discuss the patient with other providers: yes    Patient Progress  Patient progress: stable       Critical Care  Performed by: Linton Rump  Nazanin Kinner, MD  Authorized by: Ralene Bathe, MD     Critical care provider statement:     Critical care time (minutes):  35    Critical care end time:  11/14/2020 9:57 AM    Critical care time was exclusive of: Separately billable procedures and treating other patients and teaching time    Critical care was necessary to treat or prevent imminent or life-threatening deterioration of the following conditions:  Hepatic failure, metabolic crisis, dehydration, CNS failure or compromise, circulatory failure and cardiac failure    Critical care was time spent personally by me on the following activities:  Development of treatment plan with patient or surrogate, discussions with consultants, evaluation of patient's response to treatment, examination of patient, obtaining history from patient or surrogate, review of old charts, re-evaluation of patient's condition, pulse oximetry, ordering and review of radiographic studies and ordering and review of laboratory studies         No results found for this visit on 11/14/20 (from the past 4464 hour(s)).    Coding    Robin Searing, MD    This patient's care is provided during the time of unprecedented national and international emergency due to the novel coronavirus (Covid-19) pandemic.  Risk of Covid-19 infection and transmission place heavy restraints on healthcare resources.  As this pandemic advised physician and hospitals strive to respond fluidly, to remain operational and to provide care using available and at times limited resources and information.  Outcomes are unpredictable and treatment lack any well-defined guidelines.  Furthermore, the Covid-19 pandemic is impacting all aspects of emergency care including the care of patients presenting for reasons other than Covid-19, which is unavoidable during this national emergency.     Ralene Bathe, MD  11/14/20 1022

## 2020-11-15 LAB — COMPREHENSIVE METABOLIC PANEL
ALBUMIN: 1.8 g/dL — ABNORMAL LOW (ref 3.5–5.0)
ALKALINE PHOSPHATASE: 88 U/L (ref 46–116)
ALT (SGPT): 28 U/L (ref 12–78)
ANION GAP: 7 mmol/L (ref 3–11)
AST (SGOT): 39 U/L (ref 15–40)
BILIRUBIN TOTAL: 2.1 mg/dL — ABNORMAL HIGH (ref 0.3–1.2)
BLOOD UREA NITROGEN: 29 mg/dL — ABNORMAL HIGH (ref 8–20)
BUN / CREAT RATIO: 17
CALCIUM: 7.8 mg/dL — ABNORMAL LOW (ref 8.5–10.1)
CHLORIDE: 108 mmol/L — ABNORMAL HIGH (ref 98–107)
CO2: 25 mmol/L (ref 21.0–32.0)
CREATININE: 1.73 mg/dL — ABNORMAL HIGH (ref 0.60–1.10)
EGFR CKD-EPI AA FEMALE: 35 mL/min/{1.73_m2}
EGFR CKD-EPI NON-AA FEMALE: 31 mL/min/{1.73_m2}
GLUCOSE RANDOM: 185 mg/dL — ABNORMAL HIGH (ref 70–179)
POTASSIUM: 3.6 mmol/L (ref 3.5–5.0)
PROTEIN TOTAL: 4.2 g/dL — ABNORMAL LOW (ref 6.0–8.0)
SODIUM: 140 mmol/L (ref 135–145)

## 2020-11-15 LAB — CBC W/ AUTO DIFF
BASOPHILS ABSOLUTE COUNT: 0 10*9/L (ref 0.0–0.2)
BASOPHILS RELATIVE PERCENT: 0.5 %
EOSINOPHILS ABSOLUTE COUNT: 0 10*9/L (ref 0.0–0.4)
EOSINOPHILS RELATIVE PERCENT: 1.5 %
HEMATOCRIT: 26.3 % — ABNORMAL LOW (ref 34.0–44.0)
HEMOGLOBIN: 8.1 g/dL — ABNORMAL LOW (ref 11.5–15.0)
LYMPHOCYTES ABSOLUTE COUNT: 0.5 10*9/L — ABNORMAL LOW (ref 0.7–4.5)
LYMPHOCYTES RELATIVE PERCENT: 24.1 %
MEAN CORPUSCULAR HEMOGLOBIN CONC: 30.8 g/dL — ABNORMAL LOW (ref 32.0–36.0)
MEAN CORPUSCULAR HEMOGLOBIN: 31.9 pg (ref 27.0–34.0)
MEAN CORPUSCULAR VOLUME: 103.5 fL — ABNORMAL HIGH (ref 80.0–98.0)
MEAN PLATELET VOLUME: 12.4 fL — ABNORMAL HIGH (ref 7.4–10.4)
MONOCYTES ABSOLUTE COUNT: 0.3 10*9/L (ref 0.1–1.0)
MONOCYTES RELATIVE PERCENT: 14.9 %
NEUTROPHILS ABSOLUTE COUNT: 1.1 10*9/L — ABNORMAL LOW (ref 1.8–7.8)
NEUTROPHILS RELATIVE PERCENT: 58.5 %
PLATELET COUNT: 42 10*9/L — ABNORMAL LOW (ref 140–415)
RED BLOOD CELL COUNT: 2.54 10*12/L — ABNORMAL LOW (ref 3.80–5.10)
RED CELL DISTRIBUTION WIDTH: 17.8 % — ABNORMAL HIGH (ref 11.5–14.5)
WBC ADJUSTED: 2 10*9/L — ABNORMAL LOW (ref 4.0–10.5)

## 2020-11-15 LAB — AMMONIA: AMMONIA: 68 umol/L — ABNORMAL HIGH (ref 11–32)

## 2020-11-15 MED ADMIN — busPIRone (BUSPAR) tablet 10 mg: 10 mg | ORAL | @ 15:00:00

## 2020-11-15 MED ADMIN — ferrous sulfate tablet 325 mg: 325 mg | ORAL | @ 15:00:00

## 2020-11-15 MED ADMIN — levothyroxine (SYNTHROID) tablet 25 mcg: 25 ug | ORAL | @ 15:00:00

## 2020-11-15 MED ADMIN — sodium chloride 0.45% (1/2 NS) infusion: 50 mL/h | INTRAVENOUS | @ 09:00:00

## 2020-11-15 MED ADMIN — insulin regular (HumuLIN,NovoLIN) injection 0-9 Units: 0-9 [IU] | SUBCUTANEOUS | @ 22:00:00

## 2020-11-15 MED ADMIN — lactulose (CHRONULAC) oral solution (30 mL cup): 20 g | ORAL | @ 17:00:00

## 2020-11-15 MED ADMIN — bumetanide (BUMEX) tablet 2 mg: 2 mg | ORAL | @ 21:00:00

## 2020-11-15 MED ADMIN — lactulose (CHRONULAC) oral solution (30 mL cup): 20 g | ORAL | @ 22:00:00

## 2020-11-15 MED ADMIN — pantoprazole (PROTONIX) EC tablet 40 mg: 40 mg | ORAL | @ 15:00:00

## 2020-11-15 MED ADMIN — rifAXIMin (XIFAXAN) tablet 550 mg: 550 mg | ORAL | @ 17:00:00 | Stop: 2020-11-22

## 2020-11-15 NOTE — Unmapped (Signed)
Pt's husband called to again inform [facility] pt is at [place] and the events leading up to her admission. Reassured Mr Schonberger that the team was aware of his wife's admission and status.  Provided emotional support

## 2020-11-15 NOTE — Unmapped (Signed)
ED Progress Note    10:28 AM    Patient seen this morning on daily rounds.  Nursing and recent ER notes reviewed.  Patient currently pending bed to become available at [facility].  No acute events overnight.  Morning labs been reviewed.  I am going to get a repeat ammonia level.    Physical exam    General: No apparent distress, resting comfortably    Assessment/plan:    1.  Awaiting transfer to [facility]  2.  Chronic medical issues: Stable;

## 2020-11-15 NOTE — Unmapped (Signed)
As per patient's Nurse Coordinator/Amanda Price canceled patient's appointments for today due to patient being admitted to an outside hospital.

## 2020-11-16 LAB — COMPREHENSIVE METABOLIC PANEL
ALBUMIN: 1.8 g/dL — ABNORMAL LOW (ref 3.5–5.0)
ALKALINE PHOSPHATASE: 87 U/L (ref 46–116)
ALT (SGPT): 29 U/L (ref 12–78)
ANION GAP: 9 mmol/L (ref 3–11)
AST (SGOT): 43 U/L — ABNORMAL HIGH (ref 15–40)
BILIRUBIN TOTAL: 2.6 mg/dL — ABNORMAL HIGH (ref 0.3–1.2)
BLOOD UREA NITROGEN: 27 mg/dL — ABNORMAL HIGH (ref 8–20)
BUN / CREAT RATIO: 16
CALCIUM: 7.1 mg/dL — ABNORMAL LOW (ref 8.5–10.1)
CHLORIDE: 106 mmol/L (ref 98–107)
CO2: 21.6 mmol/L (ref 21.0–32.0)
CREATININE: 1.69 mg/dL — ABNORMAL HIGH (ref 0.60–1.10)
EGFR CKD-EPI AA FEMALE: 36 mL/min/{1.73_m2}
EGFR CKD-EPI NON-AA FEMALE: 31 mL/min/{1.73_m2}
GLUCOSE RANDOM: 203 mg/dL — ABNORMAL HIGH (ref 70–179)
POTASSIUM: 3.5 mmol/L (ref 3.5–5.0)
PROTEIN TOTAL: 4.2 g/dL — ABNORMAL LOW (ref 6.0–8.0)
SODIUM: 137 mmol/L (ref 135–145)

## 2020-11-16 LAB — CBC W/ AUTO DIFF
BASOPHILS ABSOLUTE COUNT: 0 10*9/L (ref 0.0–0.2)
BASOPHILS RELATIVE PERCENT: 1.1 %
EOSINOPHILS ABSOLUTE COUNT: 0.1 10*9/L (ref 0.0–0.4)
EOSINOPHILS RELATIVE PERCENT: 2.2 %
HEMATOCRIT: 27.1 % — ABNORMAL LOW (ref 34.0–44.0)
HEMOGLOBIN: 8.6 g/dL — ABNORMAL LOW (ref 11.5–15.0)
LYMPHOCYTES ABSOLUTE COUNT: 0.6 10*9/L — ABNORMAL LOW (ref 0.7–4.5)
LYMPHOCYTES RELATIVE PERCENT: 21.3 %
MEAN CORPUSCULAR HEMOGLOBIN CONC: 31.7 g/dL — ABNORMAL LOW (ref 32.0–36.0)
MEAN CORPUSCULAR HEMOGLOBIN: 32.2 pg (ref 27.0–34.0)
MEAN CORPUSCULAR VOLUME: 101.5 fL — ABNORMAL HIGH (ref 80.0–98.0)
MEAN PLATELET VOLUME: 12.1 fL — ABNORMAL HIGH (ref 7.4–10.4)
MONOCYTES ABSOLUTE COUNT: 0.4 10*9/L (ref 0.1–1.0)
MONOCYTES RELATIVE PERCENT: 14 %
NEUTROPHILS ABSOLUTE COUNT: 1.7 10*9/L — ABNORMAL LOW (ref 1.8–7.8)
NEUTROPHILS RELATIVE PERCENT: 61 %
PLATELET COUNT: 43 10*9/L — ABNORMAL LOW (ref 140–415)
RED BLOOD CELL COUNT: 2.67 10*12/L — ABNORMAL LOW (ref 3.80–5.10)
RED CELL DISTRIBUTION WIDTH: 18.1 % — ABNORMAL HIGH (ref 11.5–14.5)
WBC ADJUSTED: 2.7 10*9/L — ABNORMAL LOW (ref 4.0–10.5)

## 2020-11-16 LAB — AMMONIA: AMMONIA: 96 umol/L — ABNORMAL HIGH (ref 11–32)

## 2020-11-16 MED ADMIN — insulin regular (HumuLIN,NovoLIN) injection 0-9 Units: 0-9 [IU] | SUBCUTANEOUS | @ 17:00:00

## 2020-11-16 MED ADMIN — busPIRone (BUSPAR) tablet 10 mg: 10 mg | ORAL | @ 13:00:00

## 2020-11-16 MED ADMIN — rifAXIMin (XIFAXAN) tablet 550 mg: 550 mg | ORAL | @ 04:00:00 | Stop: 2020-11-22

## 2020-11-16 MED ADMIN — ferrous sulfate tablet 325 mg: 325 mg | ORAL | @ 13:00:00

## 2020-11-16 MED ADMIN — lactulose (CHRONULAC) oral solution (30 mL cup): 20 g | ORAL | @ 12:00:00 | Stop: 2020-11-16

## 2020-11-16 MED ADMIN — sodium chloride 0.45% (1/2 NS) infusion: 50 mL/h | INTRAVENOUS | @ 06:00:00

## 2020-11-16 MED ADMIN — lactulose (CHRONULAC) oral solution (30 mL cup): 30 g | ORAL | @ 17:00:00

## 2020-11-16 MED ADMIN — lactulose (CHRONULAC) oral solution (30 mL cup): 20 g | ORAL | @ 04:00:00

## 2020-11-16 MED ADMIN — insulin regular (HumuLIN,NovoLIN) injection 0-9 Units: 0-9 [IU] | SUBCUTANEOUS | @ 14:00:00

## 2020-11-16 MED ADMIN — busPIRone (BUSPAR) tablet 10 mg: 10 mg | ORAL | @ 04:00:00

## 2020-11-16 MED ADMIN — pantoprazole (PROTONIX) EC tablet 40 mg: 40 mg | ORAL | @ 13:00:00

## 2020-11-16 MED ADMIN — levothyroxine (SYNTHROID) tablet 25 mcg: 25 ug | ORAL | @ 12:00:00

## 2020-11-16 MED ADMIN — rifAXIMin (XIFAXAN) tablet 550 mg: 550 mg | ORAL | @ 13:00:00 | Stop: 2020-11-22

## 2020-11-16 MED ADMIN — bumetanide (BUMEX) tablet 2 mg: 2 mg | ORAL | @ 12:00:00

## 2020-11-16 NOTE — Unmapped (Signed)
Pts breakfast was delivered and placed on the tray next to the pt. Pt was fixed a cup of fresh ice water per the pts request and placed on the tray next to the pts breakfast.

## 2020-11-16 NOTE — Unmapped (Addendum)
Patient has cleaned up with minimal assist. Full linens and gown changed. Patient now laying in bed.

## 2020-11-16 NOTE — Unmapped (Signed)
Daily Progress Note    Assessment/Plan:      Assessment:    Condition: In stable condition.  Unchanged.   (Principal Problem:    Hepatic encephalopathy (CMS-HCC)  Active Problems:    NAFLD (nonalcoholic fatty liver disease)    Cirrhosis (CMS-HCC)    Hypothyroid    Type 2 diabetes mellitus without complication, with long-term current use of insulin (CMS-HCC)    AKI (acute kidney injury) (CMS-HCC)  Resolved Problems:    * No resolved hospital problems. *   ).     Plan:   Encourage ambulation.  Advance diet as tolerated.  Administer medications as ordered, resume home regimen, give fluids and resume oral medications.   (  Patient has hepatic encephalopathy ammonia level is coming down.  She is still confused this morning when seen.  This is a ER consultation visit only.  Patient is awaiting transfer at [facility].  CBC c-Met and ammonia level is ordered for tomorrow morning.      Current Facility-Administered Medications:   ???  bumetanide (BUMEX) tablet 2 mg, 2 mg, Oral, BID, Ralene Bathe, MD, 2 mg at 11/15/20 1553  ???  busPIRone (BUSPAR) tablet 10 mg, 10 mg, Oral, BID, Ralene Bathe, MD, 10 mg at 11/15/20 1029  ???  ferrous sulfate tablet 325 mg, 325 mg, Oral, Daily, Ralene Bathe, MD, 325 mg at 11/15/20 1029  ???  insulin regular (HumuLIN,NovoLIN) injection 0-9 Units, 0-9 Units, Subcutaneous, TID AC, Bernerd Pho, MD, 9 Units at 11/15/20 1637  ???  lactulose (CHRONULAC) oral solution (30 mL cup), 20 g, Oral, QID, Ralene Bathe, MD, 20 g at 11/15/20 1636  ???  levothyroxine (SYNTHROID) tablet 25 mcg, 25 mcg, Oral, daily, Ralene Bathe, MD, 25 mcg at 11/15/20 1029  ???  pantoprazole (PROTONIX) EC tablet 40 mg, 40 mg, Oral, Daily, Ralene Bathe, MD, 40 mg at 11/15/20 1029  ???  rifAXIMin (XIFAXAN) tablet 550 mg, 550 mg, Oral, BID, Ralene Bathe, MD, 550 mg at 11/15/20 1203  ???  sodium chloride 0.45% (1/2 NS) infusion, 50 mL/hr, Intravenous, Continuous, Trevor Iha, DO, Last Rate: 50 mL/hr at 11/15/20 0420, 50 mL/hr at 11/15/20 0420    Current Outpatient Medications:   ???  bumetanide (BUMEX) 2 MG tablet, Take 1 tablet by mouth twice daily, Disp: 60 tablet, Rfl: 0  ???  busPIRone (BUSPAR) 10 MG tablet, Take 1 tablet by mouth twice daily, Disp: 120 tablet, Rfl: 0  ???  cholecalciferol, vitamin D3, (CHOLECALCIFEROL) 1,000 unit tablet, Take 1,000 Units by mouth every other day. , Disp: , Rfl:   ???  ferrous sulfate 325 (65 FE) MG tablet, Take 1 tablet (325 mg total) by mouth daily., Disp: 30 tablet, Rfl: 3  ???  insulin glargine (LANTUS SOLOSTAR U-100 INSULIN) 100 unit/mL (3 mL) injection pen, Inject 0.45 mL (45 Units total) under the skin daily. (Patient taking differently: Inject 60 Units under the skin daily. ), Disp: 1350 Units, Rfl: 0  ???  insulin lispro (HUMALOG) 100 unit/mL injection, INJECT 15 TO 35 UNITS SUBCUTANEUOSLY PER SLIDING SCALE 3 TIMES DAILY, Disp: , Rfl:   ???  lactulose (CHRONULAC) 10 gram/15 mL solution, TAKE 4 TIMES A DAY. IF HAVING WATERY STOOLS MORE THAN 3 TIMES A DAY THEN REDUCE TO 3 TIMES A DAY, Disp: , Rfl:   ???  levothyroxine (SYNTHROID, LEVOTHROID) 25 MCG tablet, Take 25 mcg by mouth daily., Disp: , Rfl:   ???  lidocaine-prilocaine (EMLA)  2.5-2.5 % cream, Apply topically., Disp: , Rfl:   ???  MAGNESIUM ORAL, Take by mouth., Disp: , Rfl:   ???  pantoprazole (PROTONIX) 40 MG tablet, daily., Disp: , Rfl:   ???  pediatric multivit-iron-min (FLINTSTONES COMPLETE) tablet, Chew 2 tablets daily. , Disp: , Rfl:   ???  pen needle, diabetic 31 gauge x 1/4 Ndle, , Disp: , Rfl:   ???  potassium chloride SA (K-DUR,KLOR-CON) 20 MEQ tablet, Take 20 mEq by mouth daily. , Disp: , Rfl:   ???  propranoloL (INDERAL) 20 MG tablet, daily., Disp: , Rfl:   ???  rifAXIMin (XIFAXAN) 550 mg Tab, Take 1 tablet (550 mg total) by mouth Two (2) times a day., Disp: 60 tablet, Rfl: 11  ???  rosuvastatin (CRESTOR) 5 MG tablet, Take 5 mg by mouth every other day., Disp: , Rfl:   ???  sertraline (ZOLOFT) 50 MG tablet, Take 1 tablet by mouth once daily, Disp: 60 tablet, Rfl: 0  ???  spironolactone (ALDACTONE) 100 MG tablet, Take 1 tablet by mouth once daily, Disp: 60 tablet, Rfl: 0  ???  ZINC ACETATE ORAL, Take by mouth., Disp: , Rfl:  ).             LOS: 0 days       Subjective:    Interval History: has complaints mild confusion, denies any chest pain or shortness of breath.  Not sure why she is in the emergency room but does recognize that she was sick and was brought into the hospital...     Objective:    Vital signs in last 24 hours:  Temp:  [36.6 ??C (97.9 ??F)-36.7 ??C (98.1 ??F)] 36.7 ??C (98.1 ??F)  Heart Rate:  [72-81] 81  SpO2 Pulse:  [95-96] 96  Resp:  [16-19] 18  BP: (101-132)/(37-56) 124/53  MAP (mmHg):  [54-70] 54  SpO2:  [91 %-95 %] 95 %    Intake/Output last 3 shifts:  I/O last 3 completed shifts:  In: 1000 [IV Piggyback:1000]  Out: -   Intake/Output this shift:  No intake/output data recorded.    REVIEW OF SYSTEM:  Review of Systems   Unable to perform ROS: Mental acuity        Physical Exam:  General appearance: alert, cooperative, and mild confusion  Neck: no adenopathy, no JVD, and supple, symmetrical, trachea midline  Lungs: clear to auscultation bilaterally  Chest wall: no tenderness  Abdomen: soft, non-tender; bowel sounds normal; no masses,  no organomegaly  Extremities: extremities normal, atraumatic, no cyanosis or edema    LABS:  Recent Results (from the past 24 hour(s))   POCT Glucose    Collection Time: 11/14/20  5:25 PM   Result Value Ref Range    Glucose, POC 261 (H) 70 - 105 mg/dL   CBC w/ Differential    Collection Time: 11/15/20  3:59 AM   Result Value Ref Range    WBC 2.0 (L) 4.0 - 10.5 10*9/L    RBC 2.54 (L) 3.80 - 5.10 10*12/L    HGB 8.1 (L) 11.5 - 15.0 g/dL    HCT 45.4 (L) 09.8 - 44.0 %    MCV 103.5 (H) 80.0 - 98.0 fL    MCH 31.9 27.0 - 34.0 pg    MCHC 30.8 (L) 32.0 - 36.0 g/dL    RDW 11.9 (H) 14.7 - 14.5 %    MPV 12.4 (H) 7.4 - 10.4 fL    Platelet 42 (L) 140 - 415 10*9/L    Neutrophils %  58.5 % Lymphocytes % 24.1 %    Monocytes % 14.9 %    Eosinophils % 1.5 %    Basophils % 0.5 %    Absolute Neutrophils 1.1 (L) 1.8 - 7.8 10*9/L    Absolute Lymphocytes 0.5 (L) 0.7 - 4.5 10*9/L    Absolute Monocytes 0.3 0.1 - 1.0 10*9/L    Absolute Eosinophils 0.0 0.0 - 0.4 10*9/L    Absolute Basophils 0.0 0.0 - 0.2 10*9/L   Comprehensive Metabolic Panel    Collection Time: 11/15/20  4:58 AM   Result Value Ref Range    Sodium 140 135 - 145 mmol/L    Potassium 3.6 3.5 - 5.0 mmol/L    Chloride 108 (H) 98 - 107 mmol/L    Anion Gap 7 3 - 11 mmol/L    CO2 25.0 21.0 - 32.0 mmol/L    BUN 29 (H) 8 - 20 mg/dL    Creatinine 1.61 (H) 0.60 - 1.10 mg/dL    BUN/Creatinine Ratio 17     EGFR CKD-EPI Non-African American, Female 31 mL/min/1.7m2    EGFR CKD-EPI African American, Female 35 mL/min/1.90m2    Glucose 185 (H) 70 - 179 mg/dL    Calcium 7.8 (L) 8.5 - 10.1 mg/dL    Albumin 1.8 (L) 3.5 - 5.0 g/dL    Total Protein 4.2 (L) 6.0 - 8.0 g/dL    Total Bilirubin 2.1 (H) 0.3 - 1.2 mg/dL    AST 39 15 - 40 U/L    ALT 28 12 - 78 U/L    Alkaline Phosphatase 88 46 - 116 U/L   POCT Glucose    Collection Time: 11/15/20  7:57 AM   Result Value Ref Range    Glucose, POC 148 (H) 70 - 105 mg/dL   Ammonia    Collection Time: 11/15/20 10:38 AM   Result Value Ref Range    Ammonia 68 (H) 11 - 32 umol/L   POCT Glucose    Collection Time: 11/15/20  3:59 PM   Result Value Ref Range    Glucose, POC 387 (H) 70 - 105 mg/dL          Ignatius Specking, MD

## 2020-11-17 ENCOUNTER — Encounter (HOSPITAL_COMMUNITY)
Admission: RE | Admit: 2020-11-17 | Discharge: 2020-11-17 | Disposition: A | Discharge status: Home / Self Care | Payer: PPO | Source: Ambulatory Visit | Attending: Internal Medicine | Admitting: Internal Medicine

## 2020-11-17 LAB — COMPREHENSIVE METABOLIC PANEL
ALBUMIN: 1.9 g/dL — ABNORMAL LOW (ref 3.5–5.0)
ALKALINE PHOSPHATASE: 87 U/L (ref 46–116)
ALT (SGPT): 29 U/L (ref 12–78)
ANION GAP: 7 mmol/L (ref 3–11)
AST (SGOT): 36 U/L (ref 15–40)
BILIRUBIN TOTAL: 2.6 mg/dL — ABNORMAL HIGH (ref 0.3–1.2)
BLOOD UREA NITROGEN: 24 mg/dL — ABNORMAL HIGH (ref 8–20)
BUN / CREAT RATIO: 15
CALCIUM: 7.3 mg/dL — ABNORMAL LOW (ref 8.5–10.1)
CHLORIDE: 107 mmol/L (ref 98–107)
CO2: 22.7 mmol/L (ref 21.0–32.0)
CREATININE: 1.6 mg/dL — ABNORMAL HIGH (ref 0.60–1.10)
EGFR CKD-EPI AA FEMALE: 39 mL/min/{1.73_m2}
EGFR CKD-EPI NON-AA FEMALE: 34 mL/min/{1.73_m2}
GLUCOSE RANDOM: 208 mg/dL — ABNORMAL HIGH (ref 70–179)
POTASSIUM: 2.9 mmol/L — ABNORMAL LOW (ref 3.5–5.0)
PROTEIN TOTAL: 4.3 g/dL — ABNORMAL LOW (ref 6.0–8.0)
SODIUM: 137 mmol/L (ref 135–145)

## 2020-11-17 LAB — CBC
HEMATOCRIT: 26.3 % — ABNORMAL LOW (ref 34.0–44.0)
HEMOGLOBIN: 8.4 g/dL — ABNORMAL LOW (ref 11.5–15.0)
MEAN CORPUSCULAR HEMOGLOBIN CONC: 31.9 g/dL — ABNORMAL LOW (ref 32.0–36.0)
MEAN CORPUSCULAR HEMOGLOBIN: 32.4 pg (ref 27.0–34.0)
MEAN CORPUSCULAR VOLUME: 101.5 fL — ABNORMAL HIGH (ref 80.0–98.0)
MEAN PLATELET VOLUME: 11.8 fL — ABNORMAL HIGH (ref 7.4–10.4)
PLATELET COUNT: 40 10*9/L — ABNORMAL LOW (ref 140–415)
RED BLOOD CELL COUNT: 2.59 10*12/L — ABNORMAL LOW (ref 3.80–5.10)
RED CELL DISTRIBUTION WIDTH: 17.5 % — ABNORMAL HIGH (ref 11.5–14.5)
WBC ADJUSTED: 2.4 10*9/L — ABNORMAL LOW (ref 4.0–10.5)

## 2020-11-17 LAB — AMMONIA: AMMONIA: 62 umol/L — ABNORMAL HIGH (ref 11–32)

## 2020-11-17 LAB — SLIDE REVIEW

## 2020-11-17 MED ADMIN — lactulose (CHRONULAC) oral solution (30 mL cup): 30 g | ORAL | @ 17:00:00

## 2020-11-17 MED ADMIN — sodium chloride 0.45% (1/2 NS) infusion: 50 mL/h | INTRAVENOUS | @ 20:00:00 | Stop: 2020-11-17

## 2020-11-17 MED ADMIN — insulin regular (HumuLIN,NovoLIN) injection 0-9 Units: 0-9 [IU] | SUBCUTANEOUS | @ 23:00:00

## 2020-11-17 MED ADMIN — insulin regular (HumuLIN,NovoLIN) injection 0-9 Units: 0-9 [IU] | SUBCUTANEOUS | @ 17:00:00

## 2020-11-17 MED ADMIN — lactulose (CHRONULAC) oral solution (30 mL cup): 30 g | ORAL | @ 23:00:00

## 2020-11-17 MED ADMIN — insulin regular (HumuLIN,NovoLIN) injection 0-9 Units: 0-9 [IU] | SUBCUTANEOUS | @ 14:00:00

## 2020-11-17 MED ADMIN — rifAXIMin (XIFAXAN) tablet 550 mg: 550 mg | ORAL | @ 02:00:00 | Stop: 2020-11-22

## 2020-11-17 MED ADMIN — potassium chloride (KLOR-CON) CR tablet 40 mEq: 40 meq | ORAL | @ 20:00:00 | Stop: 2020-11-17

## 2020-11-17 MED ADMIN — bumetanide (BUMEX) tablet 2 mg: 2 mg | ORAL | @ 20:00:00

## 2020-11-17 MED ADMIN — rifAXIMin (XIFAXAN) tablet 550 mg: 550 mg | ORAL | @ 14:00:00 | Stop: 2020-11-22

## 2020-11-17 MED ADMIN — pantoprazole (PROTONIX) EC tablet 40 mg: 40 mg | ORAL | @ 14:00:00

## 2020-11-17 MED ADMIN — bumetanide (BUMEX) tablet 2 mg: 2 mg | ORAL | @ 11:00:00

## 2020-11-17 MED ADMIN — levothyroxine (SYNTHROID) tablet 25 mcg: 25 ug | ORAL | @ 11:00:00

## 2020-11-17 MED ADMIN — lactulose (CHRONULAC) oral solution (30 mL cup): 30 g | ORAL | @ 11:00:00

## 2020-11-17 MED ADMIN — lactulose (CHRONULAC) oral solution (30 mL cup): 30 g | ORAL | @ 02:00:00

## 2020-11-17 MED ADMIN — busPIRone (BUSPAR) tablet 10 mg: 10 mg | ORAL | @ 14:00:00

## 2020-11-17 MED ADMIN — busPIRone (BUSPAR) tablet 10 mg: 10 mg | ORAL | @ 02:00:00

## 2020-11-17 MED ADMIN — ferrous sulfate tablet 325 mg: 325 mg | ORAL | @ 14:00:00

## 2020-11-17 NOTE — Unmapped (Signed)
Daily Progress Note    Assessment/Plan:      Assessment:    Condition: In stable condition.  Unchanged.   (Principal Problem:    Hepatic encephalopathy (CMS-HCC)  Active Problems:    NAFLD (nonalcoholic fatty liver disease)    Cirrhosis (CMS-HCC)    Hypothyroid    Type 2 diabetes mellitus without complication, with long-term current use of insulin (CMS-HCC)    AKI (acute kidney injury) (CMS-HCC)  Resolved Problems:    * No resolved hospital problems. *   ).     Plan:   Encourage ambulation.  Advance diet as tolerated.  Administer medications as ordered, resume home regimen and resume oral medications.   (  This is a consultation visit while patient is waiting to be transferred to [facility]  Patient will continue lactulose.  Awaiting the transfer to [facility].  Patient has been intermittently confused and remains confused.  Check CBC be met ammonia level in the morning        Current Facility-Administered Medications:   ???  bumetanide (BUMEX) tablet 2 mg, 2 mg, Oral, BID, Ralene Bathe, MD, 2 mg at 11/16/20 1610  ???  busPIRone (BUSPAR) tablet 10 mg, 10 mg, Oral, BID, Ralene Bathe, MD, 10 mg at 11/16/20 2051  ???  ferrous sulfate tablet 325 mg, 325 mg, Oral, Daily, Ralene Bathe, MD, 325 mg at 11/16/20 0825  ???  insulin regular (HumuLIN,NovoLIN) injection 0-9 Units, 0-9 Units, Subcutaneous, TID AC, Bernerd Pho, MD, 5 Units at 11/16/20 1211  ???  lactulose (CHRONULAC) oral solution (30 mL cup), 30 g, Oral, QID, Ignatius Specking, MD, 30 g at 11/16/20 2048  ???  levothyroxine (SYNTHROID) tablet 25 mcg, 25 mcg, Oral, daily, Ralene Bathe, MD, 25 mcg at 11/16/20 [phone]  ???  pantoprazole (PROTONIX) EC tablet 40 mg, 40 mg, Oral, Daily, Ralene Bathe, MD, 40 mg at 11/16/20 0825  ???  rifAXIMin (XIFAXAN) tablet 550 mg, 550 mg, Oral, BID, Ralene Bathe, MD, 550 mg at 11/16/20 2051  ???  sodium chloride 0.45% (1/2 NS) infusion, 50 mL/hr, Intravenous, Continuous, Trevor Iha, DO, Last Rate: 50 mL/hr at 11/16/20 0051, 50 mL/hr at 11/16/20 0051    Current Outpatient Medications:   ???  bumetanide (BUMEX) 2 MG tablet, Take 1 tablet by mouth twice daily, Disp: 60 tablet, Rfl: 0  ???  busPIRone (BUSPAR) 10 MG tablet, Take 1 tablet by mouth twice daily, Disp: 120 tablet, Rfl: 0  ???  cholecalciferol, vitamin D3, (CHOLECALCIFEROL) 1,000 unit tablet, Take 1,000 Units by mouth every other day. , Disp: , Rfl:   ???  ferrous sulfate 325 (65 FE) MG tablet, Take 1 tablet (325 mg total) by mouth daily., Disp: 30 tablet, Rfl: 3  ???  insulin glargine (LANTUS SOLOSTAR U-100 INSULIN) 100 unit/mL (3 mL) injection pen, Inject 0.45 mL (45 Units total) under the skin daily. (Patient taking differently: Inject 60 Units under the skin daily. ), Disp: 1350 Units, Rfl: 0  ???  insulin lispro (HUMALOG) 100 unit/mL injection, INJECT 15 TO 35 UNITS SUBCUTANEUOSLY PER SLIDING SCALE 3 TIMES DAILY, Disp: , Rfl:   ???  lactulose (CHRONULAC) 10 gram/15 mL solution, TAKE 4 TIMES A DAY. IF HAVING WATERY STOOLS MORE THAN 3 TIMES A DAY THEN REDUCE TO 3 TIMES A DAY, Disp: , Rfl:   ???  levothyroxine (SYNTHROID, LEVOTHROID) 25 MCG tablet, Take 25 mcg by mouth daily., Disp: , Rfl:   ???  lidocaine-prilocaine (EMLA) 2.5-2.5 % cream, Apply topically., Disp: , Rfl:   ???  MAGNESIUM ORAL, Take by mouth., Disp: , Rfl:   ???  pantoprazole (PROTONIX) 40 MG tablet, daily., Disp: , Rfl:   ???  pediatric multivit-iron-min (FLINTSTONES COMPLETE) tablet, Chew 2 tablets daily. , Disp: , Rfl:   ???  pen needle, diabetic 31 gauge x 1/4 Ndle, , Disp: , Rfl:   ???  potassium chloride SA (K-DUR,KLOR-CON) 20 MEQ tablet, Take 20 mEq by mouth daily. , Disp: , Rfl:   ???  propranoloL (INDERAL) 20 MG tablet, daily., Disp: , Rfl:   ???  rifAXIMin (XIFAXAN) 550 mg Tab, Take 1 tablet (550 mg total) by mouth Two (2) times a day., Disp: 60 tablet, Rfl: 11  ???  rosuvastatin (CRESTOR) 5 MG tablet, Take 5 mg by mouth every other day., Disp: , Rfl:   ???  sertraline (ZOLOFT) 50 MG tablet, Take 1 tablet by mouth once daily, Disp: 60 tablet, Rfl: 0  ???  spironolactone (ALDACTONE) 100 MG tablet, Take 1 tablet by mouth once daily, Disp: 60 tablet, Rfl: 0  ???  ZINC ACETATE ORAL, Take by mouth., Disp: , Rfl:  ).             LOS: 0 days       Subjective:    Interval History: has complaints patient is confused intermittently.  Refusing to take medication.  Denies any chest pain or shortness of breath.  No fever chills  .Marland Kitchen     Objective:    Vital signs in last 24 hours:  Temp:  [36.4 ??C (97.5 ??F)-36.7 ??C (98.1 ??F)] 36.4 ??C (97.6 ??F)  Heart Rate:  [71-90] 78  Resp:  [16-18] 16  BP: (103-132)/(33-48) 114/33  MAP (mmHg):  [68] 68  SpO2:  [94 %-98 %] 97 %    Intake/Output last 3 shifts:  No intake/output data recorded.  Intake/Output this shift:  No intake/output data recorded.    REVIEW OF SYSTEM:  Review of Systems   HENT: Negative for ear pain and sore throat.    Cardiovascular: Negative for chest pain and leg swelling.   Gastrointestinal: Negative for constipation, diarrhea and heartburn.   Genitourinary: Negative for dysuria.   Skin: Negative for rash.   Neurological: Negative for headaches.        Physical Exam:  General appearance: Alert but confused  Neck: no adenopathy, no JVD, and supple, symmetrical, trachea midline  Lungs: clear to auscultation bilaterally  Chest wall: no tenderness  Abdomen: soft, non-tender; bowel sounds normal; no masses,  no organomegaly  Extremities: extremities normal, atraumatic, no cyanosis or edema    LABS:  Recent Results (from the past 24 hour(s))   Comprehensive Metabolic Panel    Collection Time: 11/16/20  6:00 AM   Result Value Ref Range    Sodium 137 135 - 145 mmol/L    Potassium 3.5 3.5 - 5.0 mmol/L    Chloride 106 98 - 107 mmol/L    Anion Gap 9 3 - 11 mmol/L    CO2 21.6 21.0 - 32.0 mmol/L    BUN 27 (H) 8 - 20 mg/dL    Creatinine 1.61 (H) 0.60 - 1.10 mg/dL    BUN/Creatinine Ratio 16     EGFR CKD-EPI Non-African American, Female 31 mL/min/1.65m2    EGFR CKD-EPI African American, Female 36 mL/min/1.40m2    Glucose 203 (H) 70 - 179 mg/dL    Calcium 7.1 (L) 8.5 - 10.1 mg/dL    Albumin 1.8 (  L) 3.5 - 5.0 g/dL    Total Protein 4.2 (L) 6.0 - 8.0 g/dL    Total Bilirubin 2.6 (H) 0.3 - 1.2 mg/dL    AST 43 (H) 15 - 40 U/L    ALT 29 12 - 78 U/L    Alkaline Phosphatase 87 46 - 116 U/L   Ammonia    Collection Time: 11/16/20  6:00 AM   Result Value Ref Range    Ammonia 96 (H) 11 - 32 umol/L   CBC w/ Differential    Collection Time: 11/16/20  6:00 AM   Result Value Ref Range    WBC 2.7 (L) 4.0 - 10.5 10*9/L    RBC 2.67 (L) 3.80 - 5.10 10*12/L    HGB 8.6 (L) 11.5 - 15.0 g/dL    HCT 16.1 (L) 09.6 - 44.0 %    MCV 101.5 (H) 80.0 - 98.0 fL    MCH 32.2 27.0 - 34.0 pg    MCHC 31.7 (L) 32.0 - 36.0 g/dL    RDW 04.5 (H) 40.9 - 14.5 %    MPV 12.1 (H) 7.4 - 10.4 fL    Platelet 43 (L) 140 - 415 10*9/L    Neutrophils % 61.0 %    Lymphocytes % 21.3 %    Monocytes % 14.0 %    Eosinophils % 2.2 %    Basophils % 1.1 %    Absolute Neutrophils 1.7 (L) 1.8 - 7.8 10*9/L    Absolute Lymphocytes 0.6 (L) 0.7 - 4.5 10*9/L    Absolute Monocytes 0.4 0.1 - 1.0 10*9/L    Absolute Eosinophils 0.1 0.0 - 0.4 10*9/L    Absolute Basophils 0.0 0.0 - 0.2 10*9/L   POCT Glucose    Collection Time: 11/16/20  8:33 AM   Result Value Ref Range    Glucose, POC 187 (H) 70 - 105 mg/dL   POCT Glucose    Collection Time: 11/16/20 12:00 PM   Result Value Ref Range    Glucose, POC 298 (H) 70 - 105 mg/dL          Ignatius Specking, MD

## 2020-11-17 NOTE — Unmapped (Signed)
Update given to patients husband.

## 2020-11-17 NOTE — Unmapped (Signed)
I have received sign out from Dr. Payton Emerald. We have reviewed patient's presentation, history, physical, PMH, testing so far, treatment so far, and pending testing plus disposition plan. Please see their note for a full history and physical.    Briefly, Marilyn French is a 65 y.o. female  who presents with AMS, hepatic encephalopathy.     Medical Decision Making:    Patient was seen and examined today during my rounds.  Patient remains medically stable for transfer. Patient is still waiting for a bed at outside hospital. Patient's care discussed with nursing staff.  No acute events overnight.  A.m. labs reveal mildly improved condition.  Patient remains slightly altered.  Patient was observed through out the shift and discussion was had with her primary care provider about admission to [facility].  Patient is being followed by the transplant team and evaluated for transplant at [facility], however at this point patient is not eligible for an emergent transplant.  PCP agrees to admit patient for further work-up and treatment of her hepatic encephalopathy.    Diagnosis:   1. Encephalopathy      Condition: Serious  Disposition: Admit    This chart has been completed using Engineer, civil (consulting) software, and while attempts have been made to ensure accuracy, certain words and phrases may not be transcribed as intended.

## 2020-11-18 LAB — COMPREHENSIVE METABOLIC PANEL
ALBUMIN: 1.8 g/dL — ABNORMAL LOW (ref 3.5–5.0)
ALKALINE PHOSPHATASE: 94 U/L (ref 46–116)
ALT (SGPT): 27 U/L (ref 12–78)
ANION GAP: 8 mmol/L (ref 3–11)
AST (SGOT): 37 U/L (ref 15–40)
BILIRUBIN TOTAL: 1.7 mg/dL — ABNORMAL HIGH (ref 0.3–1.2)
BLOOD UREA NITROGEN: 22 mg/dL — ABNORMAL HIGH (ref 8–20)
BUN / CREAT RATIO: 13
CALCIUM: 7.5 mg/dL — ABNORMAL LOW (ref 8.5–10.1)
CHLORIDE: 108 mmol/L — ABNORMAL HIGH (ref 98–107)
CO2: 23.1 mmol/L (ref 21.0–32.0)
CREATININE: 1.72 mg/dL — ABNORMAL HIGH (ref 0.60–1.10)
EGFR CKD-EPI AA FEMALE: 35 mL/min/{1.73_m2}
EGFR CKD-EPI NON-AA FEMALE: 31 mL/min/{1.73_m2}
GLUCOSE RANDOM: 215 mg/dL — ABNORMAL HIGH (ref 70–179)
POTASSIUM: 3.3 mmol/L — ABNORMAL LOW (ref 3.5–5.0)
PROTEIN TOTAL: 4.2 g/dL — ABNORMAL LOW (ref 6.0–8.0)
SODIUM: 139 mmol/L (ref 135–145)

## 2020-11-18 LAB — CBC W/ AUTO DIFF
BASOPHILS ABSOLUTE COUNT: 0 10*9/L (ref 0.0–0.2)
BASOPHILS RELATIVE PERCENT: 1.4 %
EOSINOPHILS ABSOLUTE COUNT: 0 10*9/L (ref 0.0–0.4)
EOSINOPHILS RELATIVE PERCENT: 1.8 %
HEMATOCRIT: 25.6 % — ABNORMAL LOW (ref 34.0–44.0)
HEMOGLOBIN: 8.2 g/dL — ABNORMAL LOW (ref 11.5–15.0)
LYMPHOCYTES ABSOLUTE COUNT: 0.4 10*9/L — ABNORMAL LOW (ref 0.7–4.5)
LYMPHOCYTES RELATIVE PERCENT: 19.3 %
MEAN CORPUSCULAR HEMOGLOBIN CONC: 32 g/dL (ref 32.0–36.0)
MEAN CORPUSCULAR HEMOGLOBIN: 32.4 pg (ref 27.0–34.0)
MEAN CORPUSCULAR VOLUME: 101.2 fL — ABNORMAL HIGH (ref 80.0–98.0)
MEAN PLATELET VOLUME: 12.2 fL — ABNORMAL HIGH (ref 7.4–10.4)
MONOCYTES ABSOLUTE COUNT: 0.3 10*9/L (ref 0.1–1.0)
MONOCYTES RELATIVE PERCENT: 14.2 %
NEUTROPHILS ABSOLUTE COUNT: 1.4 10*9/L — ABNORMAL LOW (ref 1.8–7.8)
NEUTROPHILS RELATIVE PERCENT: 62.8 %
PLATELET COUNT: 37 10*9/L — CL (ref 140–415)
RED BLOOD CELL COUNT: 2.53 10*12/L — ABNORMAL LOW (ref 3.80–5.10)
RED CELL DISTRIBUTION WIDTH: 17.2 % — ABNORMAL HIGH (ref 11.5–14.5)
WBC ADJUSTED: 2.2 10*9/L — ABNORMAL LOW (ref 4.0–10.5)

## 2020-11-18 LAB — AMMONIA: AMMONIA: 128 umol/L — ABNORMAL HIGH (ref 11–32)

## 2020-11-18 MED ADMIN — lactulose (CHRONULAC) oral solution (30 mL cup): 45 g | ORAL | @ 17:00:00

## 2020-11-18 MED ADMIN — lactulose (CHRONULAC) oral solution (30 mL cup): 30 g | ORAL | @ 11:00:00 | Stop: 2020-11-18

## 2020-11-18 MED ADMIN — ferrous sulfate tablet 325 mg: 325 mg | ORAL | @ 15:00:00

## 2020-11-18 MED ADMIN — rifAXIMin (XIFAXAN) tablet 550 mg: 550 mg | ORAL | @ 15:00:00 | Stop: 2020-11-22

## 2020-11-18 MED ADMIN — insulin regular (HumuLIN,NovoLIN) injection 0-9 Units: 0-9 [IU] | SUBCUTANEOUS | @ 23:00:00

## 2020-11-18 MED ADMIN — rifAXIMin (XIFAXAN) tablet 550 mg: 550 mg | ORAL | @ 02:00:00 | Stop: 2020-11-22

## 2020-11-18 MED ADMIN — bumetanide (BUMEX) tablet 2 mg: 2 mg | ORAL | @ 11:00:00

## 2020-11-18 MED ADMIN — levothyroxine (SYNTHROID) tablet 25 mcg: 25 ug | ORAL | @ 11:00:00

## 2020-11-18 MED ADMIN — insulin regular (HumuLIN,NovoLIN) injection 0-9 Units: 0-9 [IU] | SUBCUTANEOUS | @ 15:00:00

## 2020-11-18 MED ADMIN — pantoprazole (PROTONIX) EC tablet 40 mg: 40 mg | ORAL | @ 15:00:00

## 2020-11-18 MED ADMIN — busPIRone (BUSPAR) tablet 10 mg: 10 mg | ORAL | @ 02:00:00

## 2020-11-18 MED ADMIN — busPIRone (BUSPAR) tablet 10 mg: 10 mg | ORAL | @ 15:00:00

## 2020-11-18 MED ADMIN — insulin regular (HumuLIN,NovoLIN) injection 0-9 Units: 0-9 [IU] | SUBCUTANEOUS | @ 17:00:00

## 2020-11-18 MED ADMIN — lactulose (CHRONULAC) oral solution (30 mL cup): 30 g | ORAL | @ 02:00:00

## 2020-11-18 MED ADMIN — lactulose (CHRONULAC) oral solution (30 mL cup): 45 g | ORAL | @ 23:00:00

## 2020-11-18 MED ADMIN — bumetanide (BUMEX) tablet 2 mg: 2 mg | ORAL | @ 20:00:00

## 2020-11-18 NOTE — Unmapped (Signed)
Care Management  Initial Transition Planning Assessment              General  Care Manager assessed the patient by : In person interview with patient,In person interview with family  Orientation Level: Oriented X4 (Did not speak while I was in room- husband answered all questions)  Functional level prior to admission: Partially Assisted  Who provides care at home?: Family member  Level of assistance required: Bathing,Dressing,Grooming,Toileting,Transferring,Walking  Reason for referral: Discharge Planning    Contact/Decision Maker  Extended Emergency Contact Information  Primary Emergency Contact: Gays  Address: [address]           [place], [place] [address] [place]  Home Phone: [phone]  Mobile Phone: [phone]  Relation: Spouse  Preferred language: ENGLISH  Interpreter needed? No  Secondary Emergency Contact: Danella Sensing  Work Phone: [phone]  Relation: Daughter  Preferred language: ENGLISH  Interpreter needed? No    Legal Next of Kin / Guardian / POA / Advance Directives       Advance Directive (Medical Treatment)  Does patient have an advance directive covering medical treatment?: Patient does not have advance directive covering medical treatment.  Reason patient does not have an advance directive covering medical treatment:: Patient does not wish to complete one at this time.    Health Care Decision Maker [HCDM] (Medical & Mental Health Treatment)  Healthcare Decision Maker: HCDM documented in the HCDM/Contact Info section.  Information offered on HCDM, Medical & Mental Health advance directives:: Patient declined information.    Advance Directive (Mental Health Treatment)  Does patient have an advance directive covering mental health treatment?: Patient does not have advance directive covering mental health treatment.  Reason patient does not have an advance directive covering mental health treatment:: HCDM documented in the HCDM/Contact Info section.,Patient does not wish to complete one at this time.    Patient Information  Lives with: Spouse/significant other    Type of Residence: Private residence        Location/Detail: See Demographics    Support Systems/Concerns: Case Manager/Social Worker,Children,Spouse    Responsibilities/Dependents at home?: No    Home Care services in place prior to admission?: Yes  Type of Home Care services in place prior to admission: Home health aide               Equipment Currently Used at Home: walker, rolling,commode chair,cane, straight,oxygen       Currently receiving outpatient dialysis?: No       Financial Information       Need for financial assistance?: No       Social Determinants of Health  Social Determinants of Health were addressed in provider documentation.  Please refer to patient history.    Discharge Needs Assessment  Concerns to be Addressed: discharge planning,adjustment to diagnosis/illness    Clinical Risk Factors: > 65,Functional Limitations,Multiple Diagnoses (Chronic),History of Falls    Barriers to taking medications: No    Prior overnight hospital stay or ED visit in last 90 days: No    Readmission Within the Last 30 Days: no previous admission in last 30 days         Anticipated Changes Related to Illness: inability to care for self    Equipment Needed After Discharge: none    Discharge Facility/Level of Care Needs:      Readmission  Risk of Unplanned Readmission Score: UNPLANNED READMISSION SCORE: 21%  Predictive Model Details  21% (Medium)  Factor Value    Calculated 11/18/2020 12:04 18% Number of active Rx orders 28    [place] Risk of Unplanned Readmission Model 15% Number of ED visits in last six months 3     10% Charlson Comorbidity Index 9     9% ECG/EKG order present in last 6 months     9% Latest calcium low (7.5 mg/dL)     7% Latest BUN high (22 mg/dL)     6% Imaging order present in last 6 months     6% Latest hemoglobin low (8.2 g/dL)     5% Age 65     5% Number of hospitalizations in last year 1     4% Diagnosis of deficiency anemia present     4% Latest creatinine high (1.72 mg/dL)     2% Future appointment scheduled     1% Active ulcer medication Rx order present     1% Current length of stay 0.862 days      Readmitted Within the Last 30 Days? (No if blank)   Patient at risk for readmission?: Yes    Discharge Plan  Screen findings are: Discharge planning needs identified or anticipated (Comment).    Expected Discharge Date: 11/19/2020    Expected Transfer from Critical Care:                 Initial Assessment complete?: Yes

## 2020-11-18 NOTE — Unmapped (Signed)
Received call from Darla who is a Case Mgr at [facility] who is working with the [employer].  She was clarifying information shared by Mr Zolman that pt is on the wait list for a liver transplant.  TNC clarified she had been internally approved and then had a hip frx.  Notes from Dr Ruffin Frederick on 10/26/20 when pt was seen in clinic shared with Darla and contact info for Dr Ruffin Frederick provided to discuss POC going forward. Darla mentioned hospice may be appropriate and had been part of discussion with [facility] team.

## 2020-11-18 NOTE — Unmapped (Signed)
Problem: Adult Inpatient Plan of Care  Goal: Plan of Care Review  Outcome: Progressing  Goal: Patient-Specific Goal (Individualized)  Outcome: Progressing  Goal: Absence of Hospital-Acquired Illness or Injury  Outcome: Progressing  Intervention: Prevent Skin Injury  Recent Flowsheet Documentation  Taken 11/17/2020 2050 by Dwaine Gale, RN  Skin Protection: adhesive use limited  Intervention: Prevent and Manage VTE (Venous Thromboembolism) Risk  Recent Flowsheet Documentation  Taken 11/17/2020 2050 by Dwaine Gale, RN  VTE Prevention/Management: anticoagulant therapy  Goal: Optimal Comfort and Wellbeing  Outcome: Progressing  Goal: Readiness for Transition of Care  Outcome: Progressing  Goal: Rounds/Family Conference  Outcome: Progressing     Problem: Skin Injury Risk Increased  Goal: Skin Health and Integrity  Outcome: Progressing  Intervention: Optimize Skin Protection  Recent Flowsheet Documentation  Taken 11/17/2020 2050 by Dwaine Gale, RN  Pressure Reduction Techniques: frequent weight shift encouraged  Pressure Reduction Devices: positioning supports utilized  Skin Protection: adhesive use limited

## 2020-11-18 NOTE — Unmapped (Signed)
Pts husband called to make sure [facility] team was aware of his wife's status and lab results. Reassured him that teams were in communication. Mr Grennan does not want to reschedule pt's abdominal US that is scheduled for Monday yet, will make decision tomorrow depending on if his wife will be dc'd home this weekend or not. No other concerns noted at this time.

## 2020-11-18 NOTE — Unmapped (Signed)
Medicine History and Physical  It should be noted that this patient was in the emergency room for 3 days and seen as a consult  History and physical was done when the [facility] had not accepted this patient and patient was admitted to [facility] from the emergency room.      Assessment/Plan:    Principal Problem:    Hepatic encephalopathy (CMS-HCC)  Active Problems:    NAFLD (nonalcoholic fatty liver disease)    Cirrhosis (CMS-HCC)    Hypothyroid    Type 2 diabetes mellitus without complication, with long-term current use of insulin (CMS-HCC)    AKI (acute kidney injury) (CMS-HCC)  Resolved Problems:    * No resolved hospital problems. *      Plan patient has been admitted to the hospital patient has nonalcoholic fatty liver disease and cirrhosis of the liver.  Patient is getting lactulose and Xifaxan  Ammonia level being monitored.  Hemoglobin is 8.4.  Discussed with the patient family on the phone.  [facility] hospital would not accept her and then finally after 3 days in emergency room she was admitted to the hospital today.  __________________________________________________________________    Chief Complaint:  Chief Complaint   Patient presents with   ??? Weakness       HPI:  Marilyn French is a 65 y.o. female with PMHx as reviewed in the EMR that presented to [facility] with Weakness   . She  is being admitted with Hepatic encephalopathy (CMS-HCC).    Patient was brought in with change in mental status confusion.  Patient has hepatic encephalopathy and has been admitted to the hospital.  Also has anemia.  Seen by [facility].  Patient was awaiting transfer to [facility] in the emergency room for few days but the bed was not available    Continue current treatment plan as detailed above.      Ferumoxytol, Definity [perflutren lipid microspheres], Other, Statins-hmg-coa reductase inhibitors, and Tramadol hcl  Prior to Admission medications    Medication Dose, Route, Frequency   bumetanide (BUMEX) 2 MG tablet Take 1 tablet by mouth twice daily   busPIRone (BUSPAR) 10 MG tablet Take 1 tablet by mouth twice daily   cholecalciferol, vitamin D3, (CHOLECALCIFEROL) 1,000 unit tablet 1,000 Units, Oral, Every other day   ferrous sulfate 325 (65 FE) MG tablet 325 mg, Oral, Daily (standard)   insulin glargine (LANTUS SOLOSTAR U-100 INSULIN) 100 unit/mL (3 mL) injection pen 45 Units, Subcutaneous, Daily (standard)  Patient taking differently: Inject 60 Units under the skin daily.    insulin lispro (HUMALOG) 100 unit/mL injection INJECT 15 TO 35 UNITS SUBCUTANEUOSLY PER SLIDING SCALE 3 TIMES DAILY    lactulose (CHRONULAC) 10 gram/15 mL solution TAKE 4 TIMES A DAY. IF HAVING WATERY STOOLS MORE THAN 3 TIMES A DAY THEN REDUCE TO 3 TIMES A DAY   levothyroxine (SYNTHROID, LEVOTHROID) 25 MCG tablet 25 mcg, Oral, Daily (standard)   lidocaine-prilocaine (EMLA) 2.5-2.5 % cream Topical   MAGNESIUM ORAL Oral   pantoprazole (PROTONIX) 40 MG tablet Daily   pediatric multivit-iron-min (FLINTSTONES COMPLETE) tablet 2 tablets, Oral, Daily (standard)   pen needle, diabetic 31 gauge x 1/4 Ndle No dose, route, or frequency recorded.   potassium chloride SA (K-DUR,KLOR-CON) 20 MEQ tablet 20 mEq, Oral, Daily (standard)   propranoloL (INDERAL) 20 MG tablet Daily   rifAXIMin (XIFAXAN) 550 mg Tab 550 mg, Oral, 2 times a day (standard)   rosuvastatin (CRESTOR)  5 MG tablet 5 mg, Oral, Every other day   sertraline (ZOLOFT) 50 MG tablet Take 1 tablet by mouth once daily   spironolactone (ALDACTONE) 100 MG tablet Take 1 tablet by mouth once daily   ZINC ACETATE ORAL Oral     Past Medical History:   Diagnosis Date   ??? Cirrhosis (CMS-HCC)    ??? Depression    ??? Diabetes mellitus (CMS-HCC)    ??? Fibromyalgia    ??? GAVE (gastric antral vascular ectasia)    ??? History of transfusion 04/2019    reports frequent blood tranfusions   ??? HTN (hypertension)    ??? Hypercholesterolemia    ??? Hypothyroid    ??? NAFLD (nonalcoholic fatty liver disease)    ??? Osteoporosis      Past Surgical History:   Procedure Laterality Date   ??? APPENDECTOMY     ??? CHOLECYSTECTOMY     ??? IR TIPS  07/08/2018    IR TIPS 07/08/2018 Soledad Gerlach, MD IMG VIR [facility]   ??? PR COLSC FLEXIBLE W/CONTROL BLEEDING ANY METHOD N/A 10/17/2019    Procedure: COLONOSCOPY, FLEXIBLE, PROXIMAL TO SPLENIC FLEXURE; DX, W/CONTROL OF BLEEDING;  Surgeon: Jules Husbands, MD;  Location: [facility];  Service: Gastroenterology   ??? PR RIGHT HEART CATH O2 SATURATION & CARDIAC OUTPUT Right 01/18/2018    Procedure: Right Heart Catheterization;  Surgeon: Marlaine Hind, MD;  Location: [facility] CATH;  Service: Cardiology   ??? PR UPPER GI ENDOSCOPY,CTRL BLEED N/A 03/05/2019    Procedure: UGI ENDOSCOPY; WITH CONTROL OF BLEEDING, ANY METHOD;  Surgeon: Andrey Farmer, MD;  Location: [facility];  Service: Gastroenterology   ??? PR UPPER GI ENDOSCOPY,CTRL BLEED  04/04/2019    Procedure: UGI ENDOSCOPY; WITH CONTROL OF BLEEDING, ANY METHOD;  Surgeon: Janyth Pupa, MD;  Location: [facility];  Service: Gastroenterology   ??? PR UPPER GI ENDOSCOPY,CTRL BLEED N/A 05/23/2019    Procedure: UGI ENDOSCOPY; WITH CONTROL OF BLEEDING, ANY METHOD;  Surgeon: Janyth Pupa, MD;  Location: [facility];  Service: Gastroenterology   ??? PR UPPER GI ENDOSCOPY,CTRL BLEED N/A 06/13/2019    Procedure: UGI ENDOSCOPY; WITH CONTROL OF BLEEDING, ANY METHOD;  Surgeon: Annie Paras, MD;  Location: [facility];  Service: Gastroenterology   ??? PR UPPER GI ENDOSCOPY,CTRL BLEED N/A 07/11/2019    Procedure: UGI ENDOSCOPY; WITH CONTROL OF BLEEDING, ANY METHOD;  Surgeon: Annie Paras, MD;  Location: [facility];  Service: Gastroenterology   ??? PR UPPER GI ENDOSCOPY,CTRL BLEED N/A 09/16/2019    Procedure: UGI ENDOSCOPY; WITH CONTROL OF BLEEDING, ANY METHOD;  Surgeon: Beverly Milch, MD;  Location: [facility];  Service: Gastrointestinal   ??? PR UPPER GI ENDOSCOPY,CTRL BLEED N/A 10/07/2019    Procedure: UGI ENDOSCOPY; WITH CONTROL OF BLEEDING, ANY METHOD;  Surgeon: Pia Mau, MD;  Location: [facility];  Service: Gastroenterology   ??? PR UPPER GI ENDOSCOPY,CTRL BLEED N/A 01/02/2020    Procedure: UGI ENDOSCOPY; WITH CONTROL OF BLEEDING, ANY METHOD;  Surgeon: Janyth Pupa, MD;  Location: [facility];  Service: Gastroenterology   ??? PR UPPER GI ENDOSCOPY,CTRL BLEED N/A 04/09/2020    Procedure: UGI ENDOSCOPY; WITH CONTROL OF BLEEDING, ANY METHOD;  Surgeon: Janyth Pupa, MD;  Location: [facility];  Service: Gastroenterology   ??? PR UPPER GI ENDOSCOPY,DIAGNOSIS N/A 03/03/2019    Procedure: UGI ENDO, INCLUDE ESOPHAGUS, STOMACH, &  DUODENUM &/OR JEJUNUM; DX W/WO COLLECTION SPECIMN, BY BRUSH OR WASH;  Surgeon: Luanne Bras, MD;  Location: [facility];  Service: Gastroenterology   ??? PR UPPER GI ENDOSCOPY,LIGAT VARIX N/A 01/02/2020    Procedure: UGI ENDO; Everlene Balls LIG ESOPH &/OR GASTRIC VARICES;  Surgeon: Janyth Pupa, MD;  Location: [facility];  Service: Gastroenterology   ??? PR UPPER GI ENDOSCOPY,LIGAT VARIX N/A 04/09/2020    Procedure: UGI ENDO; W/BAND LIG ESOPH &/OR GASTRIC VARICES;  Surgeon: Janyth Pupa, MD;  Location: [facility];  Service: Gastroenterology   ??? PR UPPER GI ENDOSCOPY,LIGAT VARIX N/A 06/18/2020    Procedure: UGI ENDO; Everlene Balls LIG ESOPH &/OR GASTRIC VARICES;  Surgeon: Janyth Pupa, MD;  Location: [facility];  Service: Gastroenterology   ??? SALPINGOOPHORECTOMY       Social History     Socioeconomic History   ??? Marital status: Married     Spouse name: Not on file   ??? Number of children: Not on file   ??? Years of education: Not on file   ??? Highest education level: Not on file   Occupational History   ??? Not on file   Tobacco Use   ??? Smoking status: Never Smoker   ??? Smokeless tobacco: Never Used   Vaping Use   ??? Vaping Use: Never used   Substance and Sexual Activity   ??? Alcohol use: No   ??? Drug use: No   ??? Sexual activity: Not on file   Other Topics Concern   ??? Not on file   Social History Narrative   ??? Not on file     Social Determinants of Health     Financial Resource Strain: Low Risk    ??? Difficulty of Paying Living Expenses: Not hard at all   Food Insecurity: No Food Insecurity   ??? Worried About Programme researcher, broadcasting/film/video in the Last Year: Never true   ??? Ran Out of Food in the Last Year: Never true   Transportation Needs: No Transportation Needs   ??? Lack of Transportation (Medical): No   ??? Lack of Transportation (Non-Medical): No   Physical Activity: Not on file   Stress: Not on file   Social Connections: Not on file     No family history on file.    Review of Systems     Labs/Studies  All lab results last 24 hours:    Recent Results (from the past 24 hour(s))   Respiratory Pathogen Panel with COVID-19    Collection Time: 11/17/20  4:42 AM   Result Value Ref Range    Adenovirus Not Detected Not Detected    Coronavirus PCR (229E, HKU1, NL63, OC43) Not Detected Not Detected    Metapneumovirus Not Detected Not Detected    Rhinovirus/Enterovirus Not Detected Not Detected    Influenza A Not Detected Not Detected    Influenza A/H1 Not Detected Not Detected    Influenza A/H3 Not Detected Not Detected    Influenza A/H1-2009 Not Detected Not Detected    Influenza B Not Detected Not Detected    Parainfluenza 1 Not Detected Not Detected    Parainfluenza 2 Not Detected Not Detected    Parainfluenza 3 Not Detected Not Detected    Parainfluenza 4 Not Detected Not Detected    SARS-CoV-2 PCR Not Detected Not Detected    RSV A PCR Not Detected Not Detected    RSV B PCR Not Detected Not Detected  Chlamydophila (Chlamydia) pneumoniae Not Detected Not Detected    Mycoplasma pneumoniae Not Detected Not Detected   Ammonia    Collection Time: 11/17/20  8:58 AM   Result Value Ref Range    Ammonia 62 (H) 11 - 32 umol/L   CBC    Collection Time: 11/17/20  8:58 AM   Result Value Ref Range    WBC 2.4 (L) 4.0 - 10.5 10*9/L    RBC 2.59 (L) 3.80 - 5.10 10*12/L    HGB 8.4 (L) 11.5 - 15.0 g/dL    HCT 60.4 (L) 54.0 - 44.0 %    MCV 101.5 (H) 80.0 - 98.0 fL    MCH 32.4 27.0 - 34.0 pg    MCHC 31.9 (L) 32.0 - 36.0 g/dL    RDW 98.1 (H) 19.1 - 14.5 %    MPV 11.8 (H) 7.4 - 10.4 fL    Platelet 40 (L) 140 - 415 10*9/L   Comprehensive Metabolic Panel    Collection Time: 11/17/20  8:58 AM   Result Value Ref Range    Sodium 137 135 - 145 mmol/L    Potassium 2.9 (L) 3.5 - 5.0 mmol/L    Chloride 107 98 - 107 mmol/L    Anion Gap 7 3 - 11 mmol/L    CO2 22.7 21.0 - 32.0 mmol/L    BUN 24 (H) 8 - 20 mg/dL    Creatinine 4.78 (H) 0.60 - 1.10 mg/dL    BUN/Creatinine Ratio 15     EGFR CKD-EPI Non-African American, Female 34 mL/min/1.66m2    EGFR CKD-EPI African American, Female 39 mL/min/1.62m2    Glucose 208 (H) 70 - 179 mg/dL    Calcium 7.3 (L) 8.5 - 10.1 mg/dL    Albumin 1.9 (L) 3.5 - 5.0 g/dL    Total Protein 4.3 (L) 6.0 - 8.0 g/dL    Total Bilirubin 2.6 (H) 0.3 - 1.2 mg/dL    AST 36 15 - 40 U/L    ALT 29 12 - 78 U/L    Alkaline Phosphatase 87 46 - 116 U/L   Morphology Review    Collection Time: 11/17/20  8:58 AM   Result Value Ref Range    Smear Review Comments     POCT Glucose    Collection Time: 11/17/20  5:49 PM   Result Value Ref Range    Glucose, POC 288 (H) 70 - 105 mg/dL   POCT Glucose    Collection Time: 11/17/20  9:39 PM   Result Value Ref Range    Glucose, POC 288 (H) 70 - 105 mg/dL       Physical Exam    Vitals:    11/17/20 2000   BP: 124/34   Pulse: 69   Resp: 20   Temp: 36.8 ??C (98.3 ??F)   SpO2: 97%   , Body mass index is 34.42 kg/m??.,      DVT prophylaxis:  enoxaparin  Anticipated disposition: To Home  Estimated discharge:  2 days    Ignatius Specking, MD  November 17, 2020 9:43 PM

## 2020-11-18 NOTE — Unmapped (Signed)
PHYSICAL THERAPY        Patient Name:  Marilyn French       Medical Record Number: [id number]   Date of Birth: 1955-10-19  Sex: Female            Treatment Diagnosis: R26.2    Activity Tolerance: Tolerated treatment well    ASSESSMENT  Problem List: Impaired balance,Gait deviation     Assessment : pt presents to hosp with hepatic encephalopathy. pt has not experienced a functional decline, no acute PT needs. rec mob tech for daily amb to prevent decline          Personal Factors/Comorbidities Present: 1   Specific Comorbidities : med cond   Examination of Body System: 3-5 elements   Body System: muscular, skeletal, nervous, cardiovascular   Clinical Decision Making: Low     PLAN  Planned Frequency of Treatment:  D/C Services for: D/C Services      Planned Interventions:      Post-Discharge Physical Therapy Recommendations:  PT services not indicated    PT DME Recommendations: None           Goals:   Patient and Family Goals: to go home                                                                                                        Prognosis:  Good  Positive Indicators: PLOF, support at home, current level of function  Barriers to Discharge: None    SUBJECTIVE     Patient reports: agreed to participate in PT eval  Current Functional Status: pt I with mob     Prior Functional Status: stated that she uses quad cane for amb, I with ADL's req assist IADL's  Equipment available at home: Quad cane,Standard Walker,Bedside commode     Past Medical History:   Diagnosis Date   ??? Cirrhosis (CMS-HCC)    ??? Depression    ??? Diabetes mellitus (CMS-HCC)    ??? Fibromyalgia    ??? GAVE (gastric antral vascular ectasia)    ??? History of transfusion 04/2019    reports frequent blood tranfusions   ??? HTN (hypertension)    ??? Hypercholesterolemia    ??? Hypothyroid    ??? NAFLD (nonalcoholic fatty liver disease)    ??? Osteoporosis     Social History     Tobacco Use   ??? Smoking status: Never Smoker   ??? Smokeless tobacco: Never Used Substance Use Topics   ??? Alcohol use: No      Past Surgical History:   Procedure Laterality Date   ??? APPENDECTOMY     ??? CHOLECYSTECTOMY     ??? IR TIPS  07/08/2018    IR TIPS 07/08/2018 Soledad Gerlach, MD IMG VIR H[facility]V [facility]   ??? PR COLSC FLEXIBLE W/CONTROL BLEEDING ANY METHOD N/A 10/17/2019    Procedure: COLONOSCOPY, FLEXIBLE, PROXIMAL TO SPLENIC FLEXURE; DX, W/CONTROL OF BLEEDING;  Surgeon: Jules Husbands, MD;  Location: [facility];  Service: Gastroenterology   ??? PR RIGHT HEART CATH O2 SATURATION & CARDIAC OUTPUT  Right 01/18/2018    Procedure: Right Heart Catheterization;  Surgeon: Marlaine Hind, MD;  Location: [facility] CATH;  Service: Cardiology   ??? PR UPPER GI ENDOSCOPY,CTRL BLEED N/A 03/05/2019    Procedure: UGI ENDOSCOPY; WITH CONTROL OF BLEEDING, ANY METHOD;  Surgeon: Andrey Farmer, MD;  Location: [facility];  Service: Gastroenterology   ??? PR UPPER GI ENDOSCOPY,CTRL BLEED  04/04/2019    Procedure: UGI ENDOSCOPY; WITH CONTROL OF BLEEDING, ANY METHOD;  Surgeon: Janyth Pupa, MD;  Location: [facility];  Service: Gastroenterology   ??? PR UPPER GI ENDOSCOPY,CTRL BLEED N/A 05/23/2019    Procedure: UGI ENDOSCOPY; WITH CONTROL OF BLEEDING, ANY METHOD;  Surgeon: Janyth Pupa, MD;  Location: [facility];  Service: Gastroenterology   ??? PR UPPER GI ENDOSCOPY,CTRL BLEED N/A 06/13/2019    Procedure: UGI ENDOSCOPY; WITH CONTROL OF BLEEDING, ANY METHOD;  Surgeon: Annie Paras, MD;  Location: [facility];  Service: Gastroenterology   ??? PR UPPER GI ENDOSCOPY,CTRL BLEED N/A 07/11/2019    Procedure: UGI ENDOSCOPY; WITH CONTROL OF BLEEDING, ANY METHOD;  Surgeon: Annie Paras, MD;  Location: [facility];  Service: Gastroenterology   ??? PR UPPER GI ENDOSCOPY,CTRL BLEED N/A 09/16/2019    Procedure: UGI ENDOSCOPY; WITH CONTROL OF BLEEDING, ANY METHOD;  Surgeon: Beverly Milch, MD;  Location: [facility];  Service: Gastrointestinal   ??? PR UPPER GI ENDOSCOPY,CTRL BLEED N/A 10/07/2019    Procedure: UGI ENDOSCOPY; WITH CONTROL OF BLEEDING, ANY METHOD;  Surgeon: Pia Mau, MD;  Location: [facility];  Service: Gastroenterology   ??? PR UPPER GI ENDOSCOPY,CTRL BLEED N/A 01/02/2020    Procedure: UGI ENDOSCOPY; WITH CONTROL OF BLEEDING, ANY METHOD;  Surgeon: Janyth Pupa, MD;  Location: [facility];  Service: Gastroenterology   ??? PR UPPER GI ENDOSCOPY,CTRL BLEED N/A 04/09/2020    Procedure: UGI ENDOSCOPY; WITH CONTROL OF BLEEDING, ANY METHOD;  Surgeon: Janyth Pupa, MD;  Location: [facility];  Service: Gastroenterology   ??? PR UPPER GI ENDOSCOPY,DIAGNOSIS N/A 03/03/2019    Procedure: UGI ENDO, INCLUDE ESOPHAGUS, STOMACH, & DUODENUM &/OR JEJUNUM; DX W/WO COLLECTION SPECIMN, BY BRUSH OR WASH;  Surgeon: Luanne Bras, MD;  Location: [facility];  Service: Gastroenterology   ??? PR UPPER GI ENDOSCOPY,LIGAT VARIX N/A 01/02/2020    Procedure: UGI ENDO; Everlene Balls LIG ESOPH &/OR GASTRIC VARICES;  Surgeon: Janyth Pupa, MD;  Location: [facility];  Service: Gastroenterology   ??? PR UPPER GI ENDOSCOPY,LIGAT VARIX N/A 04/09/2020    Procedure: UGI ENDO; W/BAND LIG ESOPH &/OR GASTRIC VARICES;  Surgeon: Janyth Pupa, MD;  Location: [facility];  Service: Gastroenterology   ??? PR UPPER GI ENDOSCOPY,LIGAT VARIX N/A 06/18/2020    Procedure: UGI ENDO; Everlene Balls LIG ESOPH &/OR GASTRIC VARICES;  Surgeon: Janyth Pupa, MD;  Location: [facility];  Service: Gastroenterology   ??? SALPINGOOPHORECTOMY      History reviewed. No pertinent family history.     Allergies: Ferumoxytol, Definity [perflutren lipid microspheres], Other, Statins-hmg-coa reductase inhibitors, and Tramadol hcl                Objective Findings  Precautions / Restrictions  Precautions: Non-applicable  Weight Bearing Status: Non-applicable  Required Braces or Orthoses: Non-applicable    Communication Preference: Verbal   Pain Comments: none c/o  Medical Tests / Procedures: PT eval  Equipment / Environment:  Vascular access (PIV, TLC, Port-a-cath, PICC)                  Living Situation  Living Environment: House  Lives With: Spouse  Home Living: Two level home,Able to Live on main level with bedroom/bathroom,Built-in shower seat,Grab bars in shower,Raised toilet seat with rails,Stairs to enter with rails,Walk-in shower,Bedside commode  Rail placement (outside): Bilateral rails  Number of Stairs to Enter (outside): 2     Cognition  Cognition: WFL       UE ROM / Strength  UE ROM/Strength: Left WFL,Right WFL  LE ROM / Strength  LE ROM/Strength: Left WFL,Right WFL    Motor/ Sensory/ Neuro  Coordination: WFL  Proprioception: WFL  Sensation: WFL  Balance: Impaired,Impaired dynamic standing balance  Balance comment: standing F+  Posture: WFL          Transfers  Transfers: Supine to Sit,Sit to Stand  Supine to Sit assistance level: Independent  Sit to Stand assistance level: Independent     Gait  Level of Assistance: Independent  Assistive Device: Other (Comment) (held to handrail of wall which is to be expected considering she uses quad cane at home)  Distance Ambulated (ft): 150 ft  Gait: decreased step length and gait speed    Stairs: NT     Wheelchair Mobility: NT    Endurance: good    Physical Therapy Session Duration  PT Individual [mins]: 15    Medical Staff Made Aware: RN    I attest that I have reviewed the above information.  Signed: Jenel Lucks, PT  Filed 11/18/2020

## 2020-11-18 NOTE — Unmapped (Signed)
Daily Progress Note    Assessment/Plan:      Assessment:    Condition: In stable condition.  Unchanged.   (Principal Problem:    Hepatic encephalopathy (CMS-HCC)  Active Problems:    NAFLD (nonalcoholic fatty liver disease)    Cirrhosis (CMS-HCC)    Hypothyroid    Type 2 diabetes mellitus without complication, with long-term current use of insulin (CMS-HCC)    AKI (acute kidney injury) (CMS-HCC)  Resolved Problems:    * No resolved hospital problems. *   ).     Plan:   Encourage ambulation and ad lib activity.  Advance diet as tolerated.  Administer medications as ordered, resume home regimen and resume oral medications.   (Patient has anemia hemoglobin 8.2 and thrombocytopenia  We will transfuse 2 units of packed red blood cells.  Patient has cirrhosis of the liver with recurrent GI bleed due to gastric AVM. She has multiple laser surgery done for gastric AVM.  Patient had platelets of 37 only today.  Ammonia level is elevated. Lactulose has been increased.  We will check CBC be met in ammonia level. Physical therapy consultation. If clinically stable and hemoglobin is stable and ammonia is better potentially discharge tomorrow with home health and home physical therapy.        Current Facility-Administered Medications:   ???  bumetanide (BUMEX) tablet 2 mg, 2 mg, Oral, BID, Ralene Bathe, MD, 2 mg at 11/18/20 0550  ???  busPIRone (BUSPAR) tablet 10 mg, 10 mg, Oral, BID, Ralene Bathe, MD, 10 mg at 11/17/20 2057  ???  ferrous sulfate tablet 325 mg, 325 mg, Oral, Daily, Ralene Bathe, MD, 325 mg at 11/17/20 0831  ???  insulin regular (HumuLIN,NovoLIN) injection 0-9 Units, 0-9 Units, Subcutaneous, TID AC, Bernerd Pho, MD, 5 Units at 11/17/20 1754  ???  lactulose (CHRONULAC) oral solution (30 mL cup), 45 g, Oral, QID, Sundance Moise B Jenascia Bumpass, MD  ???  levothyroxine (SYNTHROID) tablet 25 mcg, 25 mcg, Oral, daily, Ralene Bathe, MD, 25 mcg at 11/18/20 0550  ???  pantoprazole (PROTONIX) EC tablet 40 mg, 40 mg, Oral, Daily, Ralene Bathe, MD, 40 mg at 11/17/20 0831  ???  rifAXIMin (XIFAXAN) tablet 550 mg, 550 mg, Oral, BID, Ralene Bathe, MD, 550 mg at 11/17/20 2057 ).             LOS: 1 day       Subjective:    Interval History: has complaints weakness. Denies any chest pain or shortness of breath  Patient is alert..     Objective:    Vital signs in last 24 hours:  Temp:  [36.7 ??C (98.1 ??F)-37.1 ??C (98.8 ??F)] 37.1 ??C (98.8 ??F)  Core Temp:  [78 ??C (172.4 ??F)] 78 ??C (172.4 ??F)  Heart Rate:  [69-84] 82  Resp:  [16-20] 18  BP: (106-138)/(34-60) 122/50  MAP (mmHg):  [55-79] 74  SpO2:  [92 %-98 %] 93 %  BMI (Calculated):  [34.43] 34.43    Intake/Output last 3 shifts:  I/O last 3 completed shifts:  In: 240 [P.O.:240]  Out: -   Intake/Output this shift:  No intake/output data recorded.    REVIEW OF SYSTEM:  Review of Systems   HENT: Negative for ear pain and sore throat.    Cardiovascular: Negative for chest pain and leg swelling.   Gastrointestinal: Negative for constipation, diarrhea and heartburn.   Genitourinary: Negative for dysuria.   Skin: Negative for rash.   Neurological: Negative for  headaches.        Physical Exam:  General appearance: alert, cooperative, and no distress  Neck: no adenopathy, no JVD, and supple, symmetrical, trachea midline  Lungs: clear to auscultation bilaterally  Chest wall: no tenderness  Abdomen: soft, non-tender; bowel sounds normal; no masses,  no organomegaly  Extremities: extremities normal, atraumatic, no cyanosis or edema    LABS:  Recent Results (from the past 24 hour(s))   Ammonia    Collection Time: 11/17/20  8:58 AM   Result Value Ref Range    Ammonia 62 (H) 11 - 32 umol/L   CBC    Collection Time: 11/17/20  8:58 AM   Result Value Ref Range    WBC 2.4 (L) 4.0 - 10.5 10*9/L    RBC 2.59 (L) 3.80 - 5.10 10*12/L    HGB 8.4 (L) 11.5 - 15.0 g/dL    HCT 29.5 (L) 62.1 - 44.0 %    MCV 101.5 (H) 80.0 - 98.0 fL    MCH 32.4 27.0 - 34.0 pg    MCHC 31.9 (L) 32.0 - 36.0 g/dL    RDW 30.8 (H) 65.7 - 14.5 %    MPV 11.8 (H) 7.4 - 10.4 fL    Platelet 40 (L) 140 - 415 10*9/L   Comprehensive Metabolic Panel    Collection Time: 11/17/20  8:58 AM   Result Value Ref Range    Sodium 137 135 - 145 mmol/L    Potassium 2.9 (L) 3.5 - 5.0 mmol/L    Chloride 107 98 - 107 mmol/L    Anion Gap 7 3 - 11 mmol/L    CO2 22.7 21.0 - 32.0 mmol/L    BUN 24 (H) 8 - 20 mg/dL    Creatinine 8.46 (H) 0.60 - 1.10 mg/dL    BUN/Creatinine Ratio 15     EGFR CKD-EPI Non-African American, Female 34 mL/min/1.64m2    EGFR CKD-EPI African American, Female 39 mL/min/1.45m2    Glucose 208 (H) 70 - 179 mg/dL    Calcium 7.3 (L) 8.5 - 10.1 mg/dL    Albumin 1.9 (L) 3.5 - 5.0 g/dL    Total Protein 4.3 (L) 6.0 - 8.0 g/dL    Total Bilirubin 2.6 (H) 0.3 - 1.2 mg/dL    AST 36 15 - 40 U/L    ALT 29 12 - 78 U/L    Alkaline Phosphatase 87 46 - 116 U/L   Morphology Review    Collection Time: 11/17/20  8:58 AM   Result Value Ref Range    Smear Review Comments     POCT Glucose    Collection Time: 11/17/20  5:49 PM   Result Value Ref Range    Glucose, POC 288 (H) 70 - 105 mg/dL   POCT Glucose    Collection Time: 11/17/20  9:39 PM   Result Value Ref Range    Glucose, POC 288 (H) 70 - 105 mg/dL   Comprehensive Metabolic Panel    Collection Time: 11/18/20  3:48 AM   Result Value Ref Range    Sodium 139 135 - 145 mmol/L    Potassium 3.3 (L) 3.5 - 5.0 mmol/L    Chloride 108 (H) 98 - 107 mmol/L    Anion Gap 8 3 - 11 mmol/L    CO2 23.1 21.0 - 32.0 mmol/L    BUN 22 (H) 8 - 20 mg/dL    Creatinine 9.62 (H) 0.60 - 1.10 mg/dL    BUN/Creatinine Ratio 13     EGFR CKD-EPI Non-African  American, Female 31 mL/min/1.2m2    EGFR CKD-EPI African American, Female 35 mL/min/1.98m2    Glucose 215 (H) 70 - 179 mg/dL    Calcium 7.5 (L) 8.5 - 10.1 mg/dL    Albumin 1.8 (L) 3.5 - 5.0 g/dL    Total Protein 4.2 (L) 6.0 - 8.0 g/dL    Total Bilirubin 1.7 (H) 0.3 - 1.2 mg/dL    AST 37 15 - 40 U/L    ALT 27 12 - 78 U/L    Alkaline Phosphatase 94 46 - 116 U/L   Ammonia    Collection Time: 11/18/20  3:48 AM   Result Value Ref Range    Ammonia 128 (H) 11 - 32 umol/L   CBC w/ Differential    Collection Time: 11/18/20  3:48 AM   Result Value Ref Range    WBC 2.2 (L) 4.0 - 10.5 10*9/L    RBC 2.53 (L) 3.80 - 5.10 10*12/L    HGB 8.2 (L) 11.5 - 15.0 g/dL    HCT 29.5 (L) 62.1 - 44.0 %    MCV 101.2 (H) 80.0 - 98.0 fL    MCH 32.4 27.0 - 34.0 pg    MCHC 32.0 32.0 - 36.0 g/dL    RDW 30.8 (H) 65.7 - 14.5 %    MPV 12.2 (H) 7.4 - 10.4 fL    Platelet 37 (LL) 140 - 415 10*9/L    Neutrophils % 62.8 %    Lymphocytes % 19.3 %    Monocytes % 14.2 %    Eosinophils % 1.8 %    Basophils % 1.4 %    Absolute Neutrophils 1.4 (L) 1.8 - 7.8 10*9/L    Absolute Lymphocytes 0.4 (L) 0.7 - 4.5 10*9/L    Absolute Monocytes 0.3 0.1 - 1.0 10*9/L    Absolute Eosinophils 0.0 0.0 - 0.4 10*9/L    Absolute Basophils 0.0 0.0 - 0.2 10*9/L   POCT Glucose    Collection Time: 11/18/20  7:43 AM   Result Value Ref Range    Glucose, POC 194 (H) 70 - 105 mg/dL          Ignatius Specking, MD

## 2020-11-18 NOTE — Unmapped (Signed)
Problem: Adult Inpatient Plan of Care  Goal: Absence of Hospital-Acquired Illness or Injury  Intervention: Prevent Skin Injury  Recent Flowsheet Documentation  Taken 11/17/2020 2050 by Dwaine Gale, RN  Skin Protection: adhesive use limited  Intervention: Prevent and Manage VTE (Venous Thromboembolism) Risk  Recent Flowsheet Documentation  Taken 11/17/2020 2050 by Dwaine Gale, RN  VTE Prevention/Management: anticoagulant therapy     Problem: Skin Injury Risk Increased  Goal: Skin Health and Integrity  Intervention: Optimize Skin Protection  Recent Flowsheet Documentation  Taken 11/17/2020 2050 by Dwaine Gale, RN  Pressure Reduction Techniques: frequent weight shift encouraged  Pressure Reduction Devices: positioning supports utilized  Skin Protection: adhesive use limited

## 2020-11-18 NOTE — Unmapped (Signed)
Problem: Adult Inpatient Plan of Care  Goal: Absence of Hospital-Acquired Illness or Injury  Outcome: Progressing  Goal: Optimal Comfort and Wellbeing  Outcome: Progressing  Goal: Readiness for Transition of Care  Outcome: Progressing  Goal: Rounds/Family Conference  Outcome: Progressing

## 2020-11-18 NOTE — Unmapped (Addendum)
Patient was admitted to the hospital as an inpatient. Patient had hepatic encephalopathy due to nonalcoholic fatty liver disease leading to the cirrhosis of the liver this is ongoing problem for long time.  Patient has been seen by [facility] and being evaluated for transplant.  She has a recurrent episode of hepatic encephalopathy and again today she was confused. She was admitted to the hospital after 3 days waiting in the emergency room to be transferred to the [facility] who had declined that transfer.  Patient was admitted as inpatient. Hemoglobin was 8.2 on the day after admission in the hospital. Patient will be typed and cross and transfuse 2 units of blood. Patient also has thrombocytopenia  She has a recurrent elevation of the ammonia level. Lactulose dose was increased. Also Xifaxan has been continued.    I had a long discussion with the patient separately and also the husband later on with the patient on the phone.  It appears that her husband is concerned about hospice at this time when her ammonia level was high when there was a discussion initiated yesterday evening.  He has talked to the liver transplant doctor yesterday and then will wait until April appointment to decide further.  Patient also wants to discuss with the family and then decide about hospice later on.  At this time she will be going home.  Lactulose 80 mL 3 times a day and extra potassium supplement for next 3 days discussed.  Patient is a recurrent ammonia elevation and hepatic encephalopathy.  Ammonia level is coming down but can rapidly change this was discussed with the patient's husband on the phone and he understands that.  Patient will be discharged home with outpatient follow-up.

## 2020-11-18 NOTE — Unmapped (Signed)
I called for an update on the patient's condition.  She had just been admitted to the floor there minutes before I called.  I was able to discuss the case with the emergency room doctor that have been taking care of her.  She says that over the last couple of days the patient's mental status has waxed and waned and her ammonia levels have been going up and down.  She did not have any new issues arise, and the patient was stable to be transferred to a floor bed.  Her calculated meld scores remained about 20.      Overall the patient's mental status is felt to be somewhat improved, but definitely not back to baseline and continues to wax and wane.  Transfer will still be required.

## 2020-11-19 LAB — SLIDE REVIEW

## 2020-11-19 LAB — CBC W/ AUTO DIFF
BASOPHILS ABSOLUTE COUNT: 0 10*9/L (ref 0.0–0.2)
BASOPHILS RELATIVE PERCENT: 1.3 %
EOSINOPHILS ABSOLUTE COUNT: 0.1 10*9/L (ref 0.0–0.4)
EOSINOPHILS RELATIVE PERCENT: 2 %
HEMATOCRIT: 34.8 % (ref 34.0–44.0)
HEMOGLOBIN: 11.4 g/dL — ABNORMAL LOW (ref 11.5–15.0)
LYMPHOCYTES ABSOLUTE COUNT: 0.7 10*9/L (ref 0.7–4.5)
LYMPHOCYTES RELATIVE PERCENT: 23.2 %
MEAN CORPUSCULAR HEMOGLOBIN CONC: 32.8 g/dL (ref 32.0–36.0)
MEAN CORPUSCULAR HEMOGLOBIN: 31.2 pg (ref 27.0–34.0)
MEAN CORPUSCULAR VOLUME: 95.3 fL (ref 80.0–98.0)
MEAN PLATELET VOLUME: 12.4 fL — ABNORMAL HIGH (ref 7.4–10.4)
MONOCYTES ABSOLUTE COUNT: 0.3 10*9/L (ref 0.1–1.0)
MONOCYTES RELATIVE PERCENT: 10.6 %
NEUTROPHILS ABSOLUTE COUNT: 1.9 10*9/L (ref 1.8–7.8)
NEUTROPHILS RELATIVE PERCENT: 62.6 %
PLATELET COUNT: 34 10*9/L — CL (ref 140–415)
RED BLOOD CELL COUNT: 3.65 10*12/L — ABNORMAL LOW (ref 3.80–5.10)
RED CELL DISTRIBUTION WIDTH: 19.7 % — ABNORMAL HIGH (ref 11.5–14.5)
WBC ADJUSTED: 3 10*9/L — ABNORMAL LOW (ref 4.0–10.5)

## 2020-11-19 LAB — BASIC METABOLIC PANEL
ANION GAP: 11 mmol/L (ref 3–11)
BLOOD UREA NITROGEN: 19 mg/dL (ref 8–20)
BUN / CREAT RATIO: 12
CALCIUM: 7.7 mg/dL — ABNORMAL LOW (ref 8.5–10.1)
CHLORIDE: 108 mmol/L — ABNORMAL HIGH (ref 98–107)
CO2: 22.1 mmol/L (ref 21.0–32.0)
CREATININE: 1.59 mg/dL — ABNORMAL HIGH (ref 0.60–1.10)
EGFR CKD-EPI AA FEMALE: 39 mL/min/{1.73_m2}
EGFR CKD-EPI NON-AA FEMALE: 34 mL/min/{1.73_m2}
GLUCOSE RANDOM: 234 mg/dL — ABNORMAL HIGH (ref 70–99)
POTASSIUM: 2.8 mmol/L — ABNORMAL LOW (ref 3.5–5.0)
SODIUM: 141 mmol/L (ref 135–145)

## 2020-11-19 LAB — AMMONIA: AMMONIA: 85 umol/L — ABNORMAL HIGH (ref 11–32)

## 2020-11-19 MED ORDER — LACTULOSE 10 GRAM/15 ML ORAL SOLUTION
Freq: Four times a day (QID) | ORAL | 2 refills | 30 days | Status: CP
Start: 2020-11-19 — End: 2020-12-19

## 2020-11-19 MED ADMIN — lactulose (CHRONULAC) oral solution (30 mL cup): 45 g | ORAL | @ 03:00:00

## 2020-11-19 MED ADMIN — insulin regular (HumuLIN,NovoLIN) injection 0-9 Units: 0-9 [IU] | SUBCUTANEOUS | @ 17:00:00 | Stop: 2020-11-19

## 2020-11-19 MED ADMIN — rifAXIMin (XIFAXAN) tablet 550 mg: 550 mg | ORAL | @ 14:00:00 | Stop: 2020-11-19

## 2020-11-19 MED ADMIN — ferrous sulfate tablet 325 mg: 325 mg | ORAL | @ 17:00:00 | Stop: 2020-11-19

## 2020-11-19 MED ADMIN — pantoprazole (PROTONIX) EC tablet 40 mg: 40 mg | ORAL | @ 14:00:00 | Stop: 2020-11-19

## 2020-11-19 MED ADMIN — insulin regular (HumuLIN,NovoLIN) injection 0-9 Units: 0-9 [IU] | SUBCUTANEOUS | @ 14:00:00 | Stop: 2020-11-19

## 2020-11-19 MED ADMIN — lactulose (CHRONULAC) oral solution (30 mL cup): 45 g | ORAL | @ 18:00:00 | Stop: 2020-11-19

## 2020-11-19 MED ADMIN — bumetanide (BUMEX) tablet 2 mg: 2 mg | ORAL | @ 11:00:00 | Stop: 2020-11-19

## 2020-11-19 MED ADMIN — HYDROcodone-acetaminophen (NORCO) 5-325 mg per tablet 1 tablet: 1 | ORAL | @ 14:00:00 | Stop: 2020-11-19

## 2020-11-19 MED ADMIN — levothyroxine (SYNTHROID) tablet 25 mcg: 25 ug | ORAL | @ 11:00:00 | Stop: 2020-11-19

## 2020-11-19 MED ADMIN — potassium chloride (KLOR-CON) packet 40 mEq: 40 meq | ORAL | @ 14:00:00 | Stop: 2020-11-19

## 2020-11-19 MED ADMIN — busPIRone (BUSPAR) tablet 10 mg: 10 mg | ORAL | @ 03:00:00

## 2020-11-19 MED ADMIN — busPIRone (BUSPAR) tablet 10 mg: 10 mg | ORAL | @ 14:00:00 | Stop: 2020-11-19

## 2020-11-19 MED ADMIN — rifAXIMin (XIFAXAN) tablet 550 mg: 550 mg | ORAL | @ 03:00:00 | Stop: 2020-11-22

## 2020-11-19 MED ADMIN — HYDROcodone-acetaminophen (NORCO) 5-325 mg per tablet 1 tablet: 1 | ORAL | @ 03:00:00 | Stop: 2020-12-02

## 2020-11-19 MED ADMIN — lactulose (CHRONULAC) oral solution (30 mL cup): 45 g | ORAL | @ 11:00:00 | Stop: 2020-11-19

## 2020-11-19 MED ADMIN — ondansetron (ZOFRAN) injection 4 mg: 4 mg | INTRAVENOUS | @ 02:00:00

## 2020-11-19 MED ADMIN — furosemide (LASIX) injection 20 mg: 20 mg | INTRAVENOUS | @ 02:00:00 | Stop: 2020-11-18

## 2020-11-19 NOTE — Unmapped (Signed)
Physician Discharge Summary    Admit date: 11/14/2020    Discharge date: 11/19/2020     Discharge to: Home with home health nurse and palliative care    Discharge Service: General Medicine (MED)    Discharge Attending Physician: Ignatius Specking, MD    Discharge Diagnoses:   Principal Problem:    Hepatic encephalopathy (CMS-HCC)  Active Problems:    NAFLD (nonalcoholic fatty liver disease)    Cirrhosis (CMS-HCC)    Hypothyroid    Type 2 diabetes mellitus without complication, with long-term current use of insulin (CMS-HCC)    AKI (acute kidney injury) (CMS-HCC)  Resolved Problems:    * No resolved hospital problems. *        Procedures: None        Pertinent Test Results:   All lab results last 24 hours:    Recent Results (from the past 24 hour(s))   Type and Screen    Collection Time: 11/18/20  1:16 PM   Result Value Ref Range    Blood Type O NEG     Antibody Screen NEG    POCT Glucose    Collection Time: 11/18/20  3:43 PM   Result Value Ref Range    Glucose, POC 313 (H) 70 - 105 mg/dL   POCT Glucose    Collection Time: 11/18/20  9:11 PM   Result Value Ref Range    Glucose, POC 286 (H) 70 - 105 mg/dL   Prepare RBC    Collection Time: 11/18/20 10:47 PM   Result Value Ref Range    Crossmatch Compatible     Unit Blood Type O Neg     ISBT Number 9500     Unit # [id number]     Status Issued     Spec Expiration [id number]     Product ID Red Blood Cells     PRODUCT CODE [id number]     Crossmatch Compatible     Unit Blood Type O Neg     ISBT Number 9500     Unit # [id number]     Status Issued     Spec Expiration [id number]     Product ID Red Blood Cells     PRODUCT CODE [id number]    Basic Metabolic Panel    Collection Time: 11/19/20  6:25 AM   Result Value Ref Range    Sodium 141 135 - 145 mmol/L    Potassium 2.8 (L) 3.5 - 5.0 mmol/L    Chloride 108 (H) 98 - 107 mmol/L    CO2 22.1 21.0 - 32.0 mmol/L    Anion Gap 11 3 - 11 mmol/L    BUN 19 8 - 20 mg/dL    Creatinine 4.74 (H) 0.60 - 1.10 mg/dL    BUN/Creatinine Ratio 12 EGFR CKD-EPI Non-African American, Female 34 mL/min/1.44m2    EGFR CKD-EPI African American, Female 39 mL/min/1.24m2    Glucose 234 (H) 70 - 99 mg/dL    Calcium 7.7 (L) 8.5 - 10.1 mg/dL   Ammonia    Collection Time: 11/19/20  6:25 AM   Result Value Ref Range    Ammonia 85 (H) 11 - 32 umol/L   CBC w/ Differential    Collection Time: 11/19/20  6:25 AM   Result Value Ref Range    WBC 3.0 (L) 4.0 - 10.5 10*9/L    RBC 3.65 (L) 3.80 - 5.10 10*12/L    HGB 11.4 (L) 11.5 - 15.0 g/dL    HCT 25.9 56.3 - 87.5 %  MCV 95.3 80.0 - 98.0 fL    MCH 31.2 27.0 - 34.0 pg    MCHC 32.8 32.0 - 36.0 g/dL    RDW 16.1 (H) 09.6 - 14.5 %    MPV 12.4 (H) 7.4 - 10.4 fL    Platelet 34 (LL) 140 - 415 10*9/L    Neutrophils % 62.6 %    Lymphocytes % 23.2 %    Monocytes % 10.6 %    Eosinophils % 2.0 %    Basophils % 1.3 %    Absolute Neutrophils 1.9 1.8 - 7.8 10*9/L    Absolute Lymphocytes 0.7 0.7 - 4.5 10*9/L    Absolute Monocytes 0.3 0.1 - 1.0 10*9/L    Absolute Eosinophils 0.1 0.0 - 0.4 10*9/L    Absolute Basophils 0.0 0.0 - 0.2 10*9/L   Morphology Review    Collection Time: 11/19/20  6:25 AM   Result Value Ref Range    Polychromasia Slight (A) Not Present    Ovalocytes Moderate (A) Not Present    Schistocytes Present (A) Not Present   POCT Glucose    Collection Time: 11/19/20  7:15 AM   Result Value Ref Range    Glucose, POC 223 (H) 70 - 105 mg/dL   POCT Glucose    Collection Time: 11/19/20 11:38 AM   Result Value Ref Range    Glucose, POC 227 (H) 70 - 105 mg/dL       Hospital Course:  Patient was admitted to the hospital as an inpatient. Patient had hepatic encephalopathy due to nonalcoholic fatty liver disease leading to the cirrhosis of the liver this is ongoing problem for long time.  Patient has been seen by [facility] and being evaluated for transplant.  She has a recurrent episode of hepatic encephalopathy and again today she was confused. She was admitted to the hospital after 3 days waiting in the emergency room to be transferred to the [facility] who had declined that transfer.  Patient was admitted as inpatient. Hemoglobin was 8.2 on the day after admission in the hospital. Patient will be typed and cross and transfuse 2 units of blood. Patient also has thrombocytopenia  She has a recurrent elevation of the ammonia level. Lactulose dose was increased. Also Xifaxan has been continued.    I had a long discussion with the patient separately and also the husband later on with the patient on the phone.  It appears that her husband is concerned about hospice at this time when her ammonia level was high when there was a discussion initiated yesterday evening.  He has talked to the liver transplant doctor yesterday and then will wait until April appointment to decide further.  Patient also wants to discuss with the family and then decide about hospice later on.  At this time she will be going home.  Lactulose 80 mL 3 times a day and extra potassium supplement for next 3 days discussed.  Patient is a recurrent ammonia elevation and hepatic encephalopathy.  Ammonia level is coming down but can rapidly change this was discussed with the patient's husband on the phone and he understands that.  Patient will be discharged home with outpatient follow-up.       I spent greater than 30 mins in the discharge of this patient.    Condition at Discharge: stable  Discharge Medications:      Your Medication List      CHANGE how you take these medications    lactulose 10 gram/15  mL solution  Commonly known as: CHRONULAC  Take 67.5 mL (45 g total) by mouth four (4) times a day.  What changed: See the new instructions.     LANTUS SOLOSTAR U-100 INSULIN 100 unit/mL (3 mL) injection pen  Generic drug: insulin glargine  Inject 0.45 mL (45 Units total) under the skin daily.  What changed: how much to take        CONTINUE taking these medications    bumetanide 2 MG tablet  Commonly known as: BUMEX  Take 1 tablet by mouth twice daily     busPIRone 10 MG tablet  Commonly known as: BUSPAR  Take 1 tablet by mouth twice daily     cholecalciferol-25 mcg (1,000 unit) 1,000 unit (25 mcg) tablet  Generic drug: cholecalciferol (vitamin D3 25 mcg (1,000 units))  Take 1,000 Units by mouth every other day.     ferrous sulfate 325 (65 FE) MG tablet  Take 1 tablet (325 mg total) by mouth daily.     flintstones complete tablet  Generic drug: pediatric multivit-iron-min  Chew 2 tablets daily.     insulin lispro 100 unit/mL injection  Commonly known as: HumaLOG  INJECT 15 TO 35 UNITS SUBCUTANEUOSLY PER SLIDING SCALE 3 TIMES DAILY     levothyroxine 25 MCG tablet  Commonly known as: SYNTHROID  Take 25 mcg by mouth daily.     lidocaine-prilocaine 2.5-2.5 % cream  Commonly known as: EMLA  Apply topically.     MAGNESIUM ORAL  Take by mouth.     pen needle, diabetic 31 gauge x 1/4 (6 mm) Ndle     potassium chloride 20 MEQ CR tablet  Commonly known as: KLOR-CON  Take 20 mEq by mouth daily.     propranoloL 20 MG tablet  Commonly known as: INDERAL  daily.     PROTONIX 40 MG tablet  Generic drug: pantoprazole  daily.     rifAXIMin 550 mg Tab  Commonly known as: XIFAXAN  Take 1 tablet (550 mg total) by mouth Two (2) times a day.     rosuvastatin 5 MG tablet  Commonly known as: CRESTOR  Take 5 mg by mouth every other day.     sertraline 50 MG tablet  Commonly known as: ZOLOFT  Take 1 tablet by mouth once daily     spironolactone 100 MG tablet  Commonly known as: ALDACTONE  Take 1 tablet by mouth once daily     ZINC ACETATE ORAL  Take by mouth.            Labs:  Blood  Recent Labs   Lab Units 11/19/20  0625 11/18/20  0348 11/17/20  0858 11/16/20  0600 11/15/20  0359 11/14/20  0327   WBC 10*9/L 3.0* 2.2* 2.4* 2.7* 2.0* 3.5*   HEMOGLOBIN g/dL 16.1* 8.2* 8.4* 8.6* 8.1* 9.7*   HEMATOCRIT % 34.8 25.6* 26.3* 27.1* 26.3* 30.3*   PLATELET COUNT (1) 10*9/L 34* 37* 40* 43* 42* 53*     Recent Labs   Lab Units 11/19/20  0625 11/18/20  0348 11/17/20  0858 11/16/20  0600 11/15/20  1038 11/15/20  0458 11/14/20  0326   SODIUM mmol/L 141 139 137 137  --  140 141   POTASSIUM mmol/L 2.8* 3.3* 2.9* 3.5  --  3.6 3.3*   CHLORIDE mmol/L 108* 108* 107 106  --  108* 108*   CO2 mmol/L 22.1 23.1 22.7 21.6  --  25.0 24.4   BUN mg/dL 19 22* 24* 27*  --  29* 27*   CREATININE mg/dL 1.61* 0.96* 0.45* 4.09*  --  1.73* 1.73*   GLUCOSE mg/dL 811* 914* 782* 956*  --  185* 146   CALCIUM mg/dL 7.7* 7.5* 7.3* 7.1*  --  7.8* 8.4*   ALBUMIN g/dL  --  1.8* 1.9* 1.8*  --  1.8* 2.1*   PROTEIN TOTAL g/dL  --  4.2* 4.3* 4.2*  --  4.2* 4.8*   BILIRUBIN TOTAL mg/dL  --  1.7* 2.6* 2.6*  --  2.1* 2.9*   AST U/L  --  37 36 43*  --  39 39   ALT U/L  --  27 29 29   --  28 30   ALK PHOS U/L  --  94 87 87  --  88 100   AMMONIA umol/L 85* 128* 62* 96* 68*  --  45*     No results in the last week  Recent Labs   Lab Units 11/14/20  0326   INR  1.45     Recent Labs   Lab Units 11/14/20  0326   LD U/L 292*      No results in the last week  Urine  Recent Labs   Lab Units 11/14/20  0638   NITRITE UA  Negative   LEUKOCYTES UA  Negative   BLOOD UA  Trace*   GLUCOSE UA  Negative   PROTEIN UA  Negative   KETONES UA  Negative     No results in the last week  Body Fluids  No results in the last week  ABG  No results for input(s): O2SOUR, FIO2ART, PHART, PCO2ART, PO2ART, HCO3ART, O2SATART, BEART in the last 72 hours.  Microbiology Results (last day)     ** No results found for the last 24 hours. **           Radiology:  XR Chest Portable    Result Date: 11/14/2020  CLINICAL DATA:  Altered mental status with weakness today. EXAM: PORTABLE CHEST 1 VIEW COMPARISON:  07/23/2020 FINDINGS: Right subclavian Port-A-Cath unchanged. Lungs are hypoinflated with mild linear atelectasis over the right mid to upper lung. No effusion. Cardiomediastinal silhouette and remainder of the exam is unchanged.     Hypoinflation with mild linear atelectasis over the right mid to upper lung. Electronically Signed   By: Elberta Fortis M.D.   On: 11/14/2020 08:24     CT Abdomen Pelvis with IV Contrast Only    Result Date: 11/14/2020  CLINICAL DATA:  Elevated serum ammonia level, acute nonlocalized abdominal pain EXAM: CT ABDOMEN AND PELVIS WITH CONTRAST TECHNIQUE: Multidetector CT imaging of the abdomen and pelvis was performed using the standard protocol following bolus administration of intravenous contrast. CONTRAST:  80 cc Omnipaque 300 COMPARISON:  05/16/2016 FINDINGS: Lower chest: The visualized lung bases are clear bilaterally. The visualized heart and pericardium are unremarkable. Hepatobiliary: Right hepatic vein to right portal vein TIPS is in place. There is no significant opacification of the left portal vein, expected following placement of a functional tips. The shunt extends into the main portal vein at the bifurcation. The liver is small and the liver contour is mildly nodular in keeping with changes of cirrhosis. Small congenital or acquired cyst noted within the inferior right hepatic lobe. Calcification within the a terminal superior mesenteric vein, portal vein, and splenic vein relate to portal venous hypertension. These vessels are patent, however. Status post cholecystectomy. No intra or extrahepatic biliary ductal dilation. Pancreas: Unremarkable Spleen: The spleen is moderately enlarged  measuring 17.9 cm in greatest dimension. No intrasplenic lesions are identified. Adrenals/Urinary Tract: The left kidney is mildly malrotated. The kidneys are otherwise unremarkable. Adrenal glands are unremarkable. The bladder is unremarkable. Stomach/Bowel: Stomach, small bowel, and large bowel are unremarkable. Appendix absent. No free intraperitoneal gas. Trace perihepatic ascites. Vascular/Lymphatic: A large retroperitoneal portosystemic collateral is seen within the aortocaval region arising from the splenic vein which has undergone coil embolization but remains patent. The point of communication with the systemic vasculature is not clearly identified on this exam. Moderate aorto iliac atherosclerotic calcification. No aortic aneurysm. No pathologic adenopathy within the abdomen and pelvis. Reproductive: Status post hysterectomy. No adnexal masses. Other: Moderate fat containing umbilical hernia. There is mild infiltration of the fat within the hernia sac possibly related to incarceration. Rectum unremarkable. Musculoskeletal: No acute bone abnormality. Left hip bipolar hemiarthroplasty has been performed.     Cirrhosis with evidence of portal venous hypertension. Interval TIPS placement since prior examination of 05/16/2016. Stable splenomegaly. Persistent retroperitoneal portosystemic collateral, possibly communicating with the inferior vena cava with evidence of prior attempts at embolization utilizing endovascular plugs. Moderate fat containing umbilical hernia with infiltration of the herniated fat possibly related to incarceration. Correlation with clinical examination is recommended. Aortic Atherosclerosis (ICD10-I70.0). Electronically Signed   By: Helyn Numbers MD   On: 11/14/2020 05:12     CT Head Wo Contrast Cervical Spine Wo Contrast    Result Date: 11/14/2020  CLINICAL DATA:  Generalized weakness, elevated serum ammonia level, encephalopathy EXAM: CT HEAD WITHOUT CONTRAST CT CERVICAL SPINE WITHOUT CONTRAST TECHNIQUE: Multidetector CT imaging of the head and cervical spine was performed following the standard protocol without intravenous contrast. Multiplanar CT image reconstructions of the cervical spine were also generated. COMPARISON:  None. FINDINGS: CT HEAD FINDINGS Brain: Normal anatomic configuration. No abnormal intra or extra-axial mass lesion or fluid collection. No abnormal mass effect or midline shift. No evidence of acute intracranial hemorrhage or infarct. Ventricular size is normal. Cerebellum unremarkable. Vascular: Moderate vascular calcifications are seen within the carotid siphons. No asymmetric hyperdense vasculature is seen at the skull base Skull: Intact Sinuses/Orbits: Paranasal sinuses are clear. Orbits are unremarkable. Other: Mastoid air cells and middle ear cavities are clear. CT CERVICAL SPINE FINDINGS Alignment: Normal. Skull base and vertebrae: No acute fracture. No primary bone lesion or focal pathologic process. Soft tissues and spinal canal: No prevertebral fluid or swelling. No visible canal hematoma. Disc levels: Vertebral body heights and intervertebral disc heights are preserved. The prevertebral soft tissues are not thickened on sagittal reformats. Review of the axial images demonstrates no significant neuroforaminal narrowing or canal stenosis. Upper chest: Unremarkable Other: None     No acute intracranial abnormality.  No calvarial fracture. No acute fracture or listhesis of the cervical spine. Electronically Signed   By: Helyn Numbers MD   On: 11/14/2020 03:06       Discharge Instructions:   Activity Instructions     Activity as tolerated          Diet Instructions     Discharge diet (specify)      Discharge Nutrition Therapy: Consistent Carb    Consistent Carb Level: Consistent Carb 45/45/45 (3/3/3)        Other Instructions     Call MD for:  difficulty breathing, headache or visual disturbances      Call MD for:  extreme fatigue      Call MD for:  persistent dizziness or light-headedness      Call MD for:  severe uncontrolled pain          Appointments which have been scheduled for you    Nov 22, 2020  3:00 PM  (Arrive by 2:30 PM)  US ABDOMEN COMPLETE with [facility] [place] RM 0  IMG ULTRASOUND [facility]) [address]  [place] [address]  [phone]   Preparing for the appointment:  Fasting before the appointment  You must refrain from eating or drinking prior to the appointment. However, if you are taking medications please take them as usual on the day of the procedure with a small sip of water.  Before the appointment  57-34 years old: Do not eat or drink anything for three (3) hours prior to the study.  5 years and older: Do not eat or drink anything for six (6) hours prior to the study.           Patient need to take the lactulose as discussed.  Xifaxan to be continued otherwise patient will have a recurrent episode of hepatic encephalopathy.    Ignatius Specking, MD

## 2020-11-19 NOTE — Unmapped (Signed)
Problem: Adult Inpatient Plan of Care  Goal: Plan of Care Review  11/19/2020 1237 by Filbert Schilder, RN  Outcome: Discharged to Home  11/19/2020 1233 by Filbert Schilder, RN  Outcome: Progressing  Goal: Patient-Specific Goal (Individualized)  11/19/2020 1237 by Filbert Schilder, RN  Outcome: Discharged to Home  11/19/2020 1233 by Filbert Schilder, RN  Outcome: Progressing  Goal: Absence of Hospital-Acquired Illness or Injury  11/19/2020 1237 by Filbert Schilder, RN  Outcome: Discharged to Home  11/19/2020 1233 by Filbert Schilder, RN  Outcome: Progressing  Intervention: Identify and Manage Fall Risk  Recent Flowsheet Documentation  Taken 11/19/2020 0830 by Filbert Schilder, RN  Safety Interventions:   bed alarm   low bed  Intervention: Prevent and Manage VTE (Venous Thromboembolism) Risk  Recent Flowsheet Documentation  Taken 11/19/2020 1200 by Filbert Schilder, RN  Activity Management: up to bedside commode  Taken 11/19/2020 0845 by Filbert Schilder, RN  VTE Prevention/Management: ambulation promoted  Taken 11/19/2020 0830 by Filbert Schilder, RN  Activity Management:   activity adjusted per tolerance   activity encouraged  Intervention: Prevent Infection  Recent Flowsheet Documentation  Taken 11/19/2020 0830 by Filbert Schilder, RN  Infection Prevention:   hand hygiene promoted   personal protective equipment utilized   rest/sleep promoted  Goal: Optimal Comfort and Wellbeing  11/19/2020 1237 by Filbert Schilder, RN  Outcome: Discharged to Home  11/19/2020 1233 by Filbert Schilder, RN  Outcome: Progressing  Goal: Readiness for Transition of Care  11/19/2020 1237 by Filbert Schilder, RN  Outcome: Discharged to Home  11/19/2020 1233 by Filbert Schilder, RN  Outcome: Progressing  Goal: Rounds/Family Conference  11/19/2020 1237 by Filbert Schilder, RN  Outcome: Discharged to Home  11/19/2020 1233 by Filbert Schilder, RN  Outcome: Progressing     Problem: Skin Injury Risk Increased  Goal: Skin Health and Integrity  11/19/2020 1237 by Filbert Schilder, RN  Outcome: Discharged to Home  11/19/2020 1233 by Filbert Schilder, RN  Outcome: Progressing  Intervention: Optimize Skin Protection  Recent Flowsheet Documentation  Taken 11/19/2020 0845 by Filbert Schilder, RN  Pressure Reduction Techniques: frequent weight shift encouraged  Pressure Reduction Devices: positioning supports utilized     Problem: Fall Injury Risk  Goal: Absence of Fall and Fall-Related Injury  11/19/2020 1237 by Filbert Schilder, RN  Outcome: Discharged to Home  11/19/2020 1233 by Filbert Schilder, RN  Outcome: Progressing  Intervention: Identify and Manage Contributors  Recent Flowsheet Documentation  Taken 11/19/2020 0845 by Filbert Schilder, RN  Self-Care Promotion: independence encouraged  Intervention: Promote Injury-Free Environment  Recent Flowsheet Documentation  Taken 11/19/2020 0830 by Filbert Schilder, RN  Safety Interventions:   bed alarm   low bed     Problem: Anemia  Goal: Anemia Symptom Improvement  11/19/2020 1237 by Filbert Schilder, RN  Outcome: Discharged to Home  11/19/2020 1233 by Filbert Schilder, RN  Outcome: Progressing  Intervention: Monitor and Manage Anemia  Recent Flowsheet Documentation  Taken 11/19/2020 0845 by Filbert Schilder, RN  Fatigue Management: fatigue-related activity identified  Taken 11/19/2020 0830 by Filbert Schilder, RN  Safety Interventions:   bed alarm   low bed

## 2020-11-19 NOTE — Unmapped (Signed)
Called husband to explain challenges for transplant again.  Current MELD score only 21 and patient is very deconditioned.  Recently broke hip in December 2021.  Currently admitted to local hospital with PSE.  Patient has told local providers that she does not want a transplant.  I have previously recommended hospice and did so again.  I am concerned that despite counseling, husband does not have realistic expectations for transplant or understand MELD allocation system despite being counseled on this for several years.

## 2020-11-19 NOTE — Unmapped (Signed)
Care Management  Initial Transition Planning Assessment              General  Care Manager assessed the patient by : In person interview with patient,In person interview with family  Orientation Level: Oriented X4  Functional level prior to admission: Partially Assisted  Who provides care at home?: Family member  Level of assistance required: Bathing,Dressing,Grooming,Toileting,Transferring,Walking  Reason for referral: Discharge Planning    Contact/Decision Maker  Extended Emergency Contact Information  Primary Emergency Contact: [place]  Address: [address]           [place], [place] [address] [place]  Home Phone: [phone]  Mobile Phone: [phone]  Relation: Spouse  Preferred language: ENGLISH  Interpreter needed? No  Secondary Emergency Contact: Danella Sensing  Work Phone: [phone]  Relation: Daughter  Preferred language: ENGLISH  Interpreter needed? No    Legal Next of Kin / Guardian / POA / Advance Directives       Advance Directive (Medical Treatment)  Does patient have an advance directive covering medical treatment?: Patient does not have advance directive covering medical treatment.  Reason patient does not have an advance directive covering medical treatment:: Patient does not wish to complete one at this time.    Health Care Decision Maker [HCDM] (Medical & Mental Health Treatment)  Healthcare Decision Maker: HCDM documented in the HCDM/Contact Info section.  Information offered on HCDM, Medical & Mental Health advance directives:: Patient declined information.    Advance Directive (Mental Health Treatment)  Does patient have an advance directive covering mental health treatment?: Patient does not have advance directive covering mental health treatment.  Reason patient does not have an advance directive covering mental health treatment:: HCDM documented in the HCDM/Contact Info section.,Patient does not wish to complete one at this time.    Patient Information  Lives with: Spouse/significant other    Type of Residence: Private residence        Location/Detail: See Demographics    Support Systems/Concerns: Case Manager/Social Worker,Children,Spouse    Responsibilities/Dependents at home?: No    Home Care services in place prior to admission?: Yes  Type of Home Care services in place prior to admission: Home health aide          Agency detail (Name/Phone #): Husband still has patient going to take labs for Potential Liver Transplant    Equipment Currently Used at Home: walker, rolling,commode chair,cane, straight,oxygen       Currently receiving outpatient dialysis?: No       Financial Information       Need for financial assistance?: No       Social Determinants of Health  Social Determinants of Health were addressed in provider documentation.  Please refer to patient history.    Discharge Needs Assessment  Concerns to be Addressed: discharge planning,adjustment to diagnosis/illness    Clinical Risk Factors: > 65,Functional Limitations,Multiple Diagnoses (Chronic),History of Falls    Barriers to taking medications: No    Prior overnight hospital stay or ED visit in last 90 days: No    Readmission Within the Last 30 Days: no previous admission in last 30 days         Anticipated Changes Related to Illness: inability to care for self    Equipment Needed After Discharge: none    Discharge Facility/Level of Care Needs:      Readmission  Risk of Unplanned Readmission Score: UNPLANNED READMISSION SCORE: 20%  Predictive Model Details  20% (Medium)  Factor Value    Calculated 11/19/2020 12:00 21% Number of active Rx orders 32    Johnson Risk of Unplanned Readmission Model 16% Number of ED visits in last six months 3    *Archived Data 10% Charlson Comorbidity Index 9     9% ECG/EKG order present in last 6 months     9% Latest calcium low (7.7 mg/dL)     6% Imaging order present in last 6 months     6% Latest hemoglobin low (11.4 g/dL)     5% Age 24     5% Number of hospitalizations in last year 1     4% Diagnosis of deficiency anemia present     4% Latest creatinine high (1.59 mg/dL)     2% Future appointment scheduled     2% Current length of stay 1.862 days     1% Active ulcer medication Rx order present      Readmitted Within the Last 30 Days? (No if blank)   Patient at risk for readmission?: Yes    Discharge Plan  Screen findings are: Discharge planning needs identified or anticipated (Comment).    Expected Discharge Date: 11/19/2020    Expected Transfer from Critical Care:      Quality data for continuing care services shared with patient and/or representative?: Yes  Patient and/or family were provided with choice of facilities / services that are available and appropriate to meet post hospital care needs?: Yes       Initial Assessment complete?: Yes

## 2020-11-19 NOTE — Unmapped (Signed)
Problem: Adult Inpatient Plan of Care  Goal: Plan of Care Review  Outcome: Progressing  Goal: Patient-Specific Goal (Individualized)  Outcome: Progressing  Goal: Absence of Hospital-Acquired Illness or Injury  Outcome: Progressing  Intervention: Identify and Manage Fall Risk  Recent Flowsheet Documentation  Taken 11/19/2020 0830 by Filbert Schilder, RN  Safety Interventions:   bed alarm   low bed  Intervention: Prevent and Manage VTE (Venous Thromboembolism) Risk  Recent Flowsheet Documentation  Taken 11/19/2020 1200 by Filbert Schilder, RN  Activity Management: up to bedside commode  Taken 11/19/2020 0845 by Filbert Schilder, RN  VTE Prevention/Management: ambulation promoted  Taken 11/19/2020 0830 by Filbert Schilder, RN  Activity Management:   activity adjusted per tolerance   activity encouraged  Intervention: Prevent Infection  Recent Flowsheet Documentation  Taken 11/19/2020 0830 by Filbert Schilder, RN  Infection Prevention:   hand hygiene promoted   personal protective equipment utilized   rest/sleep promoted  Goal: Optimal Comfort and Wellbeing  Outcome: Progressing  Goal: Readiness for Transition of Care  Outcome: Progressing  Goal: Rounds/Family Conference  Outcome: Progressing     Problem: Skin Injury Risk Increased  Goal: Skin Health and Integrity  Outcome: Progressing  Intervention: Optimize Skin Protection  Recent Flowsheet Documentation  Taken 11/19/2020 0845 by Filbert Schilder, RN  Pressure Reduction Techniques: frequent weight shift encouraged  Pressure Reduction Devices: positioning supports utilized     Problem: Fall Injury Risk  Goal: Absence of Fall and Fall-Related Injury  Outcome: Progressing  Intervention: Identify and Manage Contributors  Recent Flowsheet Documentation  Taken 11/19/2020 0845 by Filbert Schilder, RN  Self-Care Promotion: independence encouraged  Intervention: Promote Injury-Free Environment  Recent Flowsheet Documentation  Taken 11/19/2020 0830 by Filbert Schilder, RN  Safety Interventions:   bed alarm   low bed     Problem: Anemia  Goal: Anemia Symptom Improvement  Outcome: Progressing  Intervention: Monitor and Manage Anemia  Recent Flowsheet Documentation  Taken 11/19/2020 0845 by Filbert Schilder, RN  Fatigue Management: fatigue-related activity identified  Taken 11/19/2020 0830 by Filbert Schilder, RN  Safety Interventions:   bed alarm   low bed

## 2020-11-19 NOTE — Unmapped (Signed)
Billing Information     Ignatius Specking, MD     Patient:  Marilyn French female     Date of Birth:  April 11, 1956     MRN:  [id number]    Status:  Inpatient     Length of Stay:  2    Admit Date:  11/14/2020  2:18 AM     Discharge Date:  11/19/2020    Problem List  Principal Problem:    Hepatic encephalopathy (CMS-HCC)  Active Problems:    NAFLD (nonalcoholic fatty liver disease)    Cirrhosis (CMS-HCC)    Hypothyroid    Type 2 diabetes mellitus without complication, with long-term current use of insulin (CMS-HCC)    AKI (acute kidney injury) (CMS-HCC)  Resolved Problems:    * No resolved hospital problems. *       Diagnosis:   Diagnosis ICD-10-CM Associated Orders   1. Encephalopathy  G93.40

## 2020-11-21 MED ORDER — PANTOPRAZOLE 40 MG TABLET,DELAYED RELEASE
ORAL_TABLET | 0 refills | 0 days
Start: 2020-11-21 — End: ?

## 2020-11-22 DIAGNOSIS — K7210 Chronic hepatic failure without coma: Principal | ICD-10-CM

## 2020-11-22 DIAGNOSIS — Z7682 Awaiting organ transplant status: Principal | ICD-10-CM

## 2020-11-23 NOTE — Unmapped (Signed)
Pt husband Casimiro Needle) called upset that the [facility] has given up on my wife.  He reports she fell at home twice yesterday and is upset [facility] teams listen to someone with high ammonia levels as pt has said to her husband and to staff at [facility] that she does not want a transplant.  Casimiro Needle has not answered or returned calls from Care Managers that have been trying to reach out since Mrs [facility] discharge on Friday 3/4. Dr Ruffin Frederick spoke with Casimiro Needle on 3/3.    Pts daughter, Lambert Mody, called to get clarification of who was trying to reach her dad. The [facility] home phone is not working and unable to retrieve VM, which is likely the number that [facility] have been contacting.  Daughter would like more info on resources available to help her dad care for her mom at home. Daughter is realistic with expectations and understands her dad is having difficulty with current situation.

## 2020-11-24 DIAGNOSIS — K7460 Unspecified cirrhosis of liver: Secondary | ICD-10-CM | POA: Diagnosis not present

## 2020-11-24 DIAGNOSIS — K7290 Hepatic failure, unspecified without coma: Secondary | ICD-10-CM | POA: Diagnosis not present

## 2020-11-24 DIAGNOSIS — E083512 Diabetes mellitus due to underlying condition with proliferative diabetic retinopathy with macular edema, left eye: Secondary | ICD-10-CM | POA: Diagnosis not present

## 2020-11-24 DIAGNOSIS — I2729 Other secondary pulmonary hypertension: Secondary | ICD-10-CM | POA: Diagnosis not present

## 2020-11-24 DIAGNOSIS — Z299 Encounter for prophylactic measures, unspecified: Secondary | ICD-10-CM | POA: Diagnosis not present

## 2020-11-26 ENCOUNTER — Ambulatory Visit
Admit: 2020-11-26 | Discharge: 2020-12-01 | Disposition: A | Discharge status: Hospice | Payer: PRIVATE HEALTH INSURANCE | Admitting: Internal Medicine

## 2020-11-26 DIAGNOSIS — Z4682 Encounter for fitting and adjustment of non-vascular catheter: Secondary | ICD-10-CM | POA: Diagnosis not present

## 2020-11-26 DIAGNOSIS — Z66 Do not resuscitate: Secondary | ICD-10-CM | POA: Diagnosis not present

## 2020-11-26 DIAGNOSIS — I272 Pulmonary hypertension, unspecified: Secondary | ICD-10-CM | POA: Diagnosis not present

## 2020-11-26 DIAGNOSIS — K7210 Chronic hepatic failure without coma: Secondary | ICD-10-CM | POA: Diagnosis not present

## 2020-11-26 DIAGNOSIS — Z794 Long term (current) use of insulin: Secondary | ICD-10-CM | POA: Diagnosis not present

## 2020-11-26 DIAGNOSIS — E039 Hypothyroidism, unspecified: Secondary | ICD-10-CM | POA: Diagnosis not present

## 2020-11-26 DIAGNOSIS — K7460 Unspecified cirrhosis of liver: Secondary | ICD-10-CM | POA: Diagnosis not present

## 2020-11-26 DIAGNOSIS — D696 Thrombocytopenia, unspecified: Secondary | ICD-10-CM | POA: Diagnosis not present

## 2020-11-26 DIAGNOSIS — J9811 Atelectasis: Secondary | ICD-10-CM | POA: Diagnosis not present

## 2020-11-26 DIAGNOSIS — R4182 Altered mental status, unspecified: Secondary | ICD-10-CM | POA: Diagnosis not present

## 2020-11-26 DIAGNOSIS — G9341 Metabolic encephalopathy: Secondary | ICD-10-CM | POA: Diagnosis not present

## 2020-11-26 DIAGNOSIS — I129 Hypertensive chronic kidney disease with stage 1 through stage 4 chronic kidney disease, or unspecified chronic kidney disease: Secondary | ICD-10-CM | POA: Diagnosis not present

## 2020-11-26 DIAGNOSIS — K7290 Hepatic failure, unspecified without coma: Secondary | ICD-10-CM | POA: Diagnosis not present

## 2020-11-26 DIAGNOSIS — Z978 Presence of other specified devices: Secondary | ICD-10-CM | POA: Diagnosis not present

## 2020-11-26 DIAGNOSIS — D638 Anemia in other chronic diseases classified elsewhere: Secondary | ICD-10-CM | POA: Diagnosis not present

## 2020-11-26 DIAGNOSIS — M797 Fibromyalgia: Secondary | ICD-10-CM | POA: Diagnosis not present

## 2020-11-26 DIAGNOSIS — Z885 Allergy status to narcotic agent status: Secondary | ICD-10-CM | POA: Diagnosis not present

## 2020-11-26 DIAGNOSIS — E7800 Pure hypercholesterolemia, unspecified: Secondary | ICD-10-CM | POA: Diagnosis not present

## 2020-11-26 DIAGNOSIS — N189 Chronic kidney disease, unspecified: Secondary | ICD-10-CM | POA: Diagnosis not present

## 2020-11-26 DIAGNOSIS — E1122 Type 2 diabetes mellitus with diabetic chronic kidney disease: Secondary | ICD-10-CM | POA: Diagnosis not present

## 2020-11-26 DIAGNOSIS — D6959 Other secondary thrombocytopenia: Secondary | ICD-10-CM | POA: Diagnosis not present

## 2020-11-26 DIAGNOSIS — K31819 Angiodysplasia of stomach and duodenum without bleeding: Secondary | ICD-10-CM | POA: Diagnosis not present

## 2020-11-26 DIAGNOSIS — Z20822 Contact with and (suspected) exposure to covid-19: Secondary | ICD-10-CM | POA: Diagnosis not present

## 2020-11-26 DIAGNOSIS — I517 Cardiomegaly: Secondary | ICD-10-CM | POA: Diagnosis not present

## 2020-11-26 DIAGNOSIS — N390 Urinary tract infection, site not specified: Secondary | ICD-10-CM | POA: Diagnosis not present

## 2020-11-26 LAB — PROTIME-INR
INR: 1.31 (ref <=5.00)
PROTIME: 13.8 s — ABNORMAL HIGH (ref 9.1–11.0)

## 2020-11-26 LAB — URINALYSIS WITH CULTURE REFLEX
BILIRUBIN UA: NEGATIVE
GLUCOSE UA: NEGATIVE
HYALINE CASTS: 5 /LPF (ref 0–5)
KETONES UA: NEGATIVE
LEUKOCYTE ESTERASE UA: NEGATIVE
NITRITE UA: POSITIVE — AB
PH UA: 5.5 (ref 5.0–8.0)
PROTEIN UA: NEGATIVE
SPECIFIC GRAVITY UA: 1.02 (ref 1.010–1.025)
SQUAMOUS EPITHELIAL: 1 /HPF (ref 0–10)
UROBILINOGEN UA: 0.2
WBC CLUMPS: NONE SEEN /HPF
WBC UA: 10 /HPF — ABNORMAL HIGH (ref <=5)

## 2020-11-26 LAB — COMPREHENSIVE METABOLIC PANEL
ALBUMIN: 1.9 g/dL — ABNORMAL LOW (ref 3.5–5.0)
ALKALINE PHOSPHATASE: 95 U/L (ref 46–116)
ALT (SGPT): 27 U/L (ref 12–78)
ANION GAP: 7 mmol/L (ref 3–11)
AST (SGOT): 48 U/L — ABNORMAL HIGH (ref 15–40)
BILIRUBIN TOTAL: 2.8 mg/dL — ABNORMAL HIGH (ref 0.3–1.2)
BLOOD UREA NITROGEN: 25 mg/dL — ABNORMAL HIGH (ref 8–20)
BUN / CREAT RATIO: 15
CALCIUM: 8.2 mg/dL — ABNORMAL LOW (ref 8.5–10.1)
CHLORIDE: 110 mmol/L — ABNORMAL HIGH (ref 98–107)
CO2: 24.2 mmol/L (ref 21.0–32.0)
CREATININE: 1.63 mg/dL — ABNORMAL HIGH (ref 0.60–1.10)
EGFR CKD-EPI AA FEMALE: 38 mL/min/{1.73_m2}
EGFR CKD-EPI NON-AA FEMALE: 33 mL/min/{1.73_m2}
GLUCOSE RANDOM: 82 mg/dL (ref 70–179)
POTASSIUM: 4 mmol/L (ref 3.5–5.0)
PROTEIN TOTAL: 4.6 g/dL — ABNORMAL LOW (ref 6.0–8.0)
SODIUM: 141 mmol/L (ref 135–145)

## 2020-11-26 LAB — CBC W/ AUTO DIFF
BASOPHILS ABSOLUTE COUNT: 0.1 10*9/L (ref 0.0–0.2)
BASOPHILS RELATIVE PERCENT: 1.1 %
EOSINOPHILS ABSOLUTE COUNT: 0.1 10*9/L (ref 0.0–0.4)
EOSINOPHILS RELATIVE PERCENT: 1.3 %
HEMATOCRIT: 32 % — ABNORMAL LOW (ref 34.0–44.0)
HEMOGLOBIN: 10.4 g/dL — ABNORMAL LOW (ref 11.5–15.0)
LYMPHOCYTES ABSOLUTE COUNT: 0.8 10*9/L (ref 0.7–4.5)
LYMPHOCYTES RELATIVE PERCENT: 17.1 %
MEAN CORPUSCULAR HEMOGLOBIN CONC: 32.5 g/dL (ref 32.0–36.0)
MEAN CORPUSCULAR HEMOGLOBIN: 32.3 pg (ref 27.0–34.0)
MEAN CORPUSCULAR VOLUME: 99.4 fL — ABNORMAL HIGH (ref 80.0–98.0)
MEAN PLATELET VOLUME: 12.6 fL — ABNORMAL HIGH (ref 7.4–10.4)
MONOCYTES ABSOLUTE COUNT: 0.6 10*9/L (ref 0.1–1.0)
MONOCYTES RELATIVE PERCENT: 13.1 %
NEUTROPHILS ABSOLUTE COUNT: 3 10*9/L (ref 1.8–7.8)
NEUTROPHILS RELATIVE PERCENT: 67.2 %
PLATELET COUNT: 46 10*9/L — ABNORMAL LOW (ref 140–415)
RED BLOOD CELL COUNT: 3.22 10*12/L — ABNORMAL LOW (ref 3.80–5.10)
RED CELL DISTRIBUTION WIDTH: 20.4 % — ABNORMAL HIGH (ref 11.5–14.5)
WBC ADJUSTED: 4.5 10*9/L (ref 4.0–10.5)

## 2020-11-26 LAB — APTT: APTT: 25.9 s (ref 20.1–31.6)

## 2020-11-26 LAB — SLIDE REVIEW

## 2020-11-26 LAB — MAGNESIUM: MAGNESIUM: 2 mg/dL (ref 1.6–2.6)

## 2020-11-26 LAB — AMMONIA: AMMONIA: 147 umol/L — ABNORMAL HIGH (ref 11–32)

## 2020-11-26 MED ADMIN — lactulose (CHRONULAC) oral solution (30 mL cup): 20 g | ORAL | @ 23:00:00 | Stop: 2020-11-26

## 2020-11-26 MED ADMIN — cefTRIAXone in dextrose (premix) (ROCEPHIN) 1 gram/50 mL IVPB 1 g: 1 g | INTRAVENOUS | @ 21:00:00 | Stop: 2020-11-26

## 2020-11-26 MED ADMIN — dextrose 5 % and sodium chloride 0.45 % infusion: 50 mL/h | INTRAVENOUS

## 2020-11-26 NOTE — Unmapped (Signed)
Emergency Department Provider Note        ED Clinical Impression     Final diagnoses:   Hepatic encephalopathy (CMS-HCC) (Primary)   Acute UTI       ED Assessment/Plan       Condition: Stable  Disposition: Admit    This chart has been completed using Engineer, civil (consulting) software, and while attempts have been made to ensure accuracy, certain words and phrases may not be transcribed as intended.     History     Chief Complaint   Patient presents with   ??? Altered Mental Status   ??? Fall   ??? Abnormal Lab     HPI    Marilyn French is a 65 y.o. female  who presents today to the  emergency department complaining of altered mental status since yesterday.  Patient has a history of end-stage liver disease and is on the transplant list.  Patient apparently fell last night also.  Family reports that she usually presents this way when her ammonia levels are elevated.  She was recently in the hospital with a similar presentation.  Patient is unable to give me any history.        Allergies: is allergic to ferumoxytol, definity [perflutren lipid microspheres], other, statins-hmg-coa reductase inhibitors, and tramadol hcl.  Medications: has a current medication list which includes the following long-term medication(s): bumetanide, lantus solostar u-100 insulin, levothyroxine, rosuvastatin, sertraline, and spironolactone.  PMHx:  has a past medical history of Cirrhosis (CMS-HCC), Depression, Diabetes mellitus (CMS-HCC), Fibromyalgia, GAVE (gastric antral vascular ectasia), History of transfusion (04/2019), HTN (hypertension), Hypercholesterolemia, Hypothyroid, NAFLD (nonalcoholic fatty liver disease), and Osteoporosis.  PSHx:  has a past surgical history that includes Appendectomy; Cholecystectomy; Salpingoophorectomy; pr right heart cath o2 saturation & cardiac output (Right, 01/18/2018); IR Tips (07/08/2018); pr upper gi endoscopy,diagnosis (N/A, 03/03/2019); pr upper gi endoscopy,ctrl bleed (N/A, 03/05/2019); pr upper gi endoscopy,ctrl bleed (04/04/2019); pr upper gi endoscopy,ctrl bleed (N/A, 05/23/2019); pr upper gi endoscopy,ctrl bleed (N/A, 06/13/2019); pr upper gi endoscopy,ctrl bleed (N/A, 07/11/2019); pr upper gi endoscopy,ctrl bleed (N/A, 09/16/2019); pr upper gi endoscopy,ctrl bleed (N/A, 10/07/2019); pr colsc flexible w/control bleeding any method (N/A, 10/17/2019); pr upper gi endoscopy,ligat varix (N/A, 01/02/2020); pr upper gi endoscopy,ctrl bleed (N/A, 01/02/2020); pr upper gi endoscopy,ligat varix (N/A, 04/09/2020); pr upper gi endoscopy,ctrl bleed (N/A, 04/09/2020); and pr upper gi endoscopy,ligat varix (N/A, 06/18/2020).  SocHx:  reports that she has never smoked. She has never used smokeless tobacco. She reports that she does not drink alcohol and does not use drugs.  Allergies, Medications, Medical, Surgical, and Social History were reviewed as documented above.    Review Of Systems    Review of Systems   Unable to perform ROS: Mental status change       Physical Exam     BP 131/47  - Pulse 88  - Temp 37.2 ??C (99 ??F) (Oral)  - Resp 16  - Wt 88.7 kg (195 lb 8 oz)  - LMP  (LMP Unknown)  - SpO2 100%  - BMI 34.63 kg/m??     Physical Exam  Vitals and nursing note reviewed.   Constitutional:       General: She is not in acute distress.  HENT:      Head: Normocephalic.   Eyes:      Conjunctiva/sclera: Conjunctivae normal.   Cardiovascular:      Rate and Rhythm: Normal rate and regular rhythm.      Pulses: Normal pulses.  Heart sounds: Normal heart sounds.   Pulmonary:      Effort: No respiratory distress.      Breath sounds: Normal breath sounds.   Abdominal:      General: Bowel sounds are normal. There is no distension.      Palpations: Abdomen is soft.      Tenderness: There is no abdominal tenderness.   Musculoskeletal:         General: No deformity.   Skin:     General: Skin is warm.      Capillary Refill: Capillary refill takes 2 to 3 seconds.      Comments: Normal cap refill   Neurological:      General: No focal deficit present.      GCS: GCS eye subscore is 3. GCS verbal subscore is 1. GCS motor subscore is 5.      Comments: No obvious focal findings.  Difficult exam given patient's mental status.   Psychiatric:         Mood and Affect: Mood normal.         ED Course   MDM  Number of Diagnoses or Management Options  Acute UTI  Hepatic encephalopathy (CMS-HCC)  Diagnosis management comments: Clinical picture suggest hepatic encephalopathy.  Differential diagnosis also includes electrolyte abnormality versus UTI    3:32 PM    Labs and imaging reviewed.  Lactulose ordered.  We will also cover her UTI with Rocephin.    Patient's care discussed with Dr. Sherril Croon.  Will admit.         Amount and/or Complexity of Data Reviewed  Clinical lab tests: ordered and reviewed  Tests in the radiology section of CPT??: ordered and reviewed  Tests in the medicine section of CPT??: ordered and reviewed         Critical Care  Performed by: Bernerd Pho, MD  Authorized by: Ignatius Specking, MD     Critical care provider statement:     Critical care time (minutes):  30    Critical care time was exclusive of:  Separately billable procedures and treating other patients    Critical care was necessary to treat or prevent imminent or life-threatening deterioration of the following conditions:  CNS failure or compromise    Critical care was time spent personally by me on the following activities:  Discussions with primary provider, evaluation of patient's response to treatment, examination of patient, ordering and review of radiographic studies, ordering and review of laboratory studies, re-evaluation of patient's condition and review of old charts         Encounter Date: 11/26/20   ECG 12 lead (Adult)   Result Value    EKG Systolic BP     EKG Diastolic BP     EKG Ventricular Rate 87    EKG Atrial Rate 87    EKG P-R Interval 246    EKG QRS Duration 106    EKG Q-T Interval 398    EKG QTC Calculation 478    EKG Calculated P Axis 73    EKG Calculated R Axis -57 EKG Calculated T Axis 72    QTC Fredericia 450    Narrative    Sinus rhythm with sinus arrhythmia with 1st degree AV block  Left anterior fascicular block  Possible Anterior infarct , age undetermined  Abnormal ECG  No previous ECGs available  Confirmed by Gwenyth Bouillon ([id number]) on 11/26/2020 3:53:54 PM       Coding    ED Results  Results for orders placed or performed during the hospital encounter of 11/26/20   Respiratory Pathogen Panel with COVID-19   Result Value Ref Range    Adenovirus Not Detected Not Detected    Coronavirus PCR (229E, HKU1, NL63, OC43) Not Detected Not Detected    Metapneumovirus Not Detected Not Detected    Rhinovirus/Enterovirus Not Detected Not Detected    Influenza A Not Detected Not Detected    Influenza A/H1 Not Detected Not Detected    Influenza A/H3 Not Detected Not Detected    Influenza A/H1-2009 Not Detected Not Detected    Influenza B Not Detected Not Detected    Parainfluenza 1 Not Detected Not Detected    Parainfluenza 2 Not Detected Not Detected    Parainfluenza 3 Not Detected Not Detected    Parainfluenza 4 Not Detected Not Detected    SARS-CoV-2 PCR Not Detected Not Detected    RSV A PCR Not Detected Not Detected    RSV B PCR Not Detected Not Detected    Chlamydophila (Chlamydia) pneumoniae Not Detected Not Detected    Mycoplasma pneumoniae Not Detected Not Detected   Comprehensive Metabolic Panel   Result Value Ref Range    Sodium 141 135 - 145 mmol/L    Potassium 4.0 3.5 - 5.0 mmol/L    Chloride 110 (H) 98 - 107 mmol/L    Anion Gap 7 3 - 11 mmol/L    CO2 24.2 21.0 - 32.0 mmol/L    BUN 25 (H) 8 - 20 mg/dL    Creatinine 8.11 (H) 0.60 - 1.10 mg/dL    BUN/Creatinine Ratio 15     EGFR CKD-EPI Non-African American, Female 33 mL/min/1.36m2    EGFR CKD-EPI African American, Female 38 mL/min/1.34m2    Glucose 82 70 - 179 mg/dL    Calcium 8.2 (L) 8.5 - 10.1 mg/dL    Albumin 1.9 (L) 3.5 - 5.0 g/dL    Total Protein 4.6 (L) 6.0 - 8.0 g/dL    Total Bilirubin 2.8 (H) 0.3 - 1.2 mg/dL    AST 48 (H) 15 - 40 U/L    ALT 27 12 - 78 U/L    Alkaline Phosphatase 95 46 - 116 U/L   Magnesium Level   Result Value Ref Range    Magnesium 2.0 1.6 - 2.6 mg/dL   PT-INR   Result Value Ref Range    PT 13.8 (H) 9.1 - 11.0 sec    INR 1.31 <=5.00   APTT   Result Value Ref Range    APTT 25.9 20.1 - 31.6 sec   Ammonia   Result Value Ref Range    Ammonia 147 (H) 11 - 32 umol/L   Urinalysis with Culture Reflex    Specimen: Catheterized-In and Out Catheter; Urine   Result Value Ref Range    Color, UA Amber     Clarity, UA Hazy (A) Clear    Specific Gravity, UA 1.020 1.010 - 1.025    pH, UA 5.5 5.0 - 8.0    Leukocyte Esterase, UA Negative Negative    Nitrite, UA Positive (A) Negative    Protein, UA Negative Negative    Glucose, UA Negative Negative    Ketones, UA Negative Negative    Urobilinogen, UA 0.2 mg/dL 0.1 - 1.0 mg/dL    Bilirubin, UA Negative Negative    Blood, UA Trace (A) Negative    WBC, UA 10 (H) <=5 /HPF    Squam Epithel, UA 1 0 - 10 /HPF    Bacteria, UA  Large (A) Few, Occasional, Small /HPF    WBC Clumps None Seen None Seen /HPF    Hyaline Casts, UA 5 0 - 5 /LPF   ECG 12 lead (Adult)   Result Value Ref Range    EKG Systolic BP  mmHg    EKG Diastolic BP  mmHg    EKG Ventricular Rate 87 BPM    EKG Atrial Rate 87 BPM    EKG P-R Interval 246 ms    EKG QRS Duration 106 ms    EKG Q-T Interval 398 ms    EKG QTC Calculation 478 ms    EKG Calculated P Axis 73 degrees    EKG Calculated R Axis -57 degrees    EKG Calculated T Axis 72 degrees    QTC Fredericia 450 ms   CBC w/ Differential   Result Value Ref Range    WBC 4.5 4.0 - 10.5 10*9/L    RBC 3.22 (L) 3.80 - 5.10 10*12/L    HGB 10.4 (L) 11.5 - 15.0 g/dL    HCT 91.4 (L) 78.2 - 44.0 %    MCV 99.4 (H) 80.0 - 98.0 fL    MCH 32.3 27.0 - 34.0 pg    MCHC 32.5 32.0 - 36.0 g/dL    RDW 95.6 (H) 21.3 - 14.5 %    MPV 12.6 (H) 7.4 - 10.4 fL    Platelet 46 (L) 140 - 415 10*9/L    Neutrophils % 67.2 %    Lymphocytes % 17.1 %    Monocytes % 13.1 %    Eosinophils % 1.3 %    Basophils % 1.1 %    Absolute Neutrophils 3.0 1.8 - 7.8 10*9/L    Absolute Lymphocytes 0.8 0.7 - 4.5 10*9/L    Absolute Monocytes 0.6 0.1 - 1.0 10*9/L    Absolute Eosinophils 0.1 0.0 - 0.4 10*9/L    Absolute Basophils 0.1 0.0 - 0.2 10*9/L    Anisocytosis Slight (A) Not Present   Morphology Review   Result Value Ref Range    Polychromasia Slight (A) Not Present     ECG 12 lead (Adult)    Result Date: 11/26/2020  Sinus rhythm with sinus arrhythmia with 1st degree AV block Left anterior fascicular block Possible Anterior infarct , age undetermined Abnormal ECG No previous ECGs available Confirmed by Gwenyth Bouillon ([id number]) on 11/26/2020 3:53:54 PM    XR Chest Portable    Result Date: 11/26/2020  CLINICAL DATA:  Altered mental status, liver failure, fall EXAM: PORTABLE CHEST 1 VIEW COMPARISON:  11/14/2020 FINDINGS: Right chest port catheter. Cardiomegaly. Mild, diffuse interstitial pulmonary opacity. The visualized skeletal structures are unremarkable.     Cardiomegaly with mild, diffuse interstitial pulmonary opacity, which may reflect mild edema. No focal airspace opacity. Electronically Signed   By: Lauralyn Primes M.D.   On: 11/26/2020 14:18     CT head without contrast    Result Date: 11/26/2020  CLINICAL DATA:  Mental status change. EXAM: CT HEAD WITHOUT CONTRAST TECHNIQUE: Contiguous axial images were obtained from the base of the skull through the vertex without intravenous contrast. COMPARISON:  CT head 11/14/2020 FINDINGS: Brain: No evidence of acute infarction, hemorrhage, hydrocephalus, extra-axial collection or mass lesion/mass effect. Vascular: Negative for hyperdense vessel Skull: Negative Sinuses/Orbits: Mild mucosal edema paranasal sinuses. Negative orbit Other: None     Negative CT of the brain. Mild mucosal edema paranasal sinuses. Electronically Signed   By: Marlan Palau M.D.   On: 11/26/2020 14:53  Medications Administered:   Medications   lactulose (CHRONULAC) oral solution (30 mL cup) (has no administration in time range)   cefTRIAXone in dextrose (premix) (ROCEPHIN) 1 gram/50 mL IVPB 1 g (1 g Intravenous New Bag 11/26/20 1629)   lactulose (CHRONULAC) oral solution (30 mL cup) (has no administration in time range)          Bernerd Pho, MD  11/26/20 [phone]

## 2020-11-26 NOTE — Unmapped (Addendum)
PT comes in via EMS due to AMS and fall last night. Per EMS pt get altered when ammonia levels get elevated. Per EMS pt is on liver failure and has had recent admition to hospital.

## 2020-11-27 LAB — COMPREHENSIVE METABOLIC PANEL
ALBUMIN: 1.8 g/dL — ABNORMAL LOW (ref 3.5–5.0)
ALKALINE PHOSPHATASE: 95 U/L (ref 46–116)
ALT (SGPT): 25 U/L (ref 12–78)
ANION GAP: 10 mmol/L (ref 3–11)
AST (SGOT): 37 U/L (ref 15–40)
BILIRUBIN TOTAL: 2.9 mg/dL — ABNORMAL HIGH (ref 0.3–1.2)
BLOOD UREA NITROGEN: 28 mg/dL — ABNORMAL HIGH (ref 8–20)
BUN / CREAT RATIO: 16
CALCIUM: 8 mg/dL — ABNORMAL LOW (ref 8.5–10.1)
CHLORIDE: 111 mmol/L — ABNORMAL HIGH (ref 98–107)
CO2: 21.6 mmol/L (ref 21.0–32.0)
CREATININE: 1.73 mg/dL — ABNORMAL HIGH (ref 0.60–1.10)
EGFR CKD-EPI AA FEMALE: 35 mL/min/{1.73_m2}
EGFR CKD-EPI NON-AA FEMALE: 31 mL/min/{1.73_m2}
GLUCOSE RANDOM: 201 mg/dL — ABNORMAL HIGH (ref 70–179)
POTASSIUM: 3.5 mmol/L (ref 3.5–5.0)
PROTEIN TOTAL: 4.3 g/dL — ABNORMAL LOW (ref 6.0–8.0)
SODIUM: 143 mmol/L (ref 135–145)

## 2020-11-27 LAB — CBC W/ AUTO DIFF
BASOPHILS ABSOLUTE COUNT: 0 10*9/L (ref 0.0–0.2)
BASOPHILS RELATIVE PERCENT: 1 %
EOSINOPHILS ABSOLUTE COUNT: 0.1 10*9/L (ref 0.0–0.4)
EOSINOPHILS RELATIVE PERCENT: 1.5 %
HEMATOCRIT: 30.9 % — ABNORMAL LOW (ref 34.0–44.0)
HEMOGLOBIN: 9.6 g/dL — ABNORMAL LOW (ref 11.5–15.0)
LYMPHOCYTES ABSOLUTE COUNT: 0.7 10*9/L (ref 0.7–4.5)
LYMPHOCYTES RELATIVE PERCENT: 18.3 %
MEAN CORPUSCULAR HEMOGLOBIN CONC: 31.1 g/dL — ABNORMAL LOW (ref 32.0–36.0)
MEAN CORPUSCULAR HEMOGLOBIN: 31.6 pg (ref 27.0–34.0)
MEAN CORPUSCULAR VOLUME: 101.6 fL — ABNORMAL HIGH (ref 80.0–98.0)
MEAN PLATELET VOLUME: 12.6 fL — ABNORMAL HIGH (ref 7.4–10.4)
MONOCYTES ABSOLUTE COUNT: 0.4 10*9/L (ref 0.1–1.0)
MONOCYTES RELATIVE PERCENT: 10 %
NEUTROPHILS ABSOLUTE COUNT: 2.7 10*9/L (ref 1.8–7.8)
NEUTROPHILS RELATIVE PERCENT: 68.9 %
PLATELET COUNT: 45 10*9/L — ABNORMAL LOW (ref 140–415)
RED BLOOD CELL COUNT: 3.04 10*12/L — ABNORMAL LOW (ref 3.80–5.10)
RED CELL DISTRIBUTION WIDTH: 20.9 % — ABNORMAL HIGH (ref 11.5–14.5)
WBC ADJUSTED: 3.9 10*9/L — ABNORMAL LOW (ref 4.0–10.5)

## 2020-11-27 LAB — AMMONIA: AMMONIA: 55 umol/L — ABNORMAL HIGH (ref 11–32)

## 2020-11-27 MED ADMIN — lactulose (CHRONULAC) oral solution (30 mL cup): 45 g | ORAL | @ 11:00:00

## 2020-11-27 MED ADMIN — busPIRone (BUSPAR) tablet 10 mg: 10 mg | ORAL | @ 11:00:00

## 2020-11-27 MED ADMIN — lactulose (CHRONULAC) oral solution (30 mL cup): 45 g | ORAL | @ 23:00:00

## 2020-11-27 MED ADMIN — cefTRIAXone in dextrose (premix) (ROCEPHIN) 1 gram/50 mL IVPB 1 g: 1 g | INTRAVENOUS | @ 23:00:00 | Stop: 2020-12-04

## 2020-11-27 MED ADMIN — rifAXIMin (XIFAXAN) tablet 550 mg: 550 mg | ORAL | @ 03:00:00 | Stop: 2020-12-16

## 2020-11-27 MED ADMIN — spironolactone (ALDACTONE) tablet 100 mg: 100 mg | ORAL | @ 14:00:00 | Stop: 2020-12-04

## 2020-11-27 MED ADMIN — lactulose (CHRONULAC) oral solution (30 mL cup): 45 g | ORAL | @ 18:00:00

## 2020-11-27 MED ADMIN — dextrose 5 % and sodium chloride 0.45 % infusion: 50 mL/h | INTRAVENOUS | @ 19:00:00

## 2020-11-27 MED ADMIN — busPIRone (BUSPAR) tablet 10 mg: 10 mg | ORAL | @ 19:00:00

## 2020-11-27 MED ADMIN — insulin lispro (HumaLOG) injection 0-15 Units: 0-15 [IU] | SUBCUTANEOUS | @ 01:00:00

## 2020-11-27 MED ADMIN — bumetanide (BUMEX) tablet 2 mg: 2 mg | ORAL | @ 14:00:00 | Stop: 2020-12-02

## 2020-11-27 MED ADMIN — lactulose (CHRONULAC) oral solution (30 mL cup): 45 g | ORAL | @ 03:00:00

## 2020-11-27 MED ADMIN — pantoprazole (PROTONIX) EC tablet 40 mg: 40 mg | ORAL | @ 14:00:00 | Stop: 2020-12-04

## 2020-11-27 MED ADMIN — insulin lispro (HumaLOG) injection 0-15 Units: 0-15 [IU] | SUBCUTANEOUS | @ 22:00:00

## 2020-11-27 MED ADMIN — insulin lispro (HumaLOG) injection 0-15 Units: 0-15 [IU] | SUBCUTANEOUS | @ 17:00:00

## 2020-11-27 MED ADMIN — rifAXIMin (XIFAXAN) tablet 550 mg: 550 mg | ORAL | @ 14:00:00 | Stop: 2020-12-16

## 2020-11-27 MED ADMIN — insulin lispro (HumaLOG) injection 0-15 Units: 0-15 [IU] | SUBCUTANEOUS | @ 13:00:00

## 2020-11-27 NOTE — Unmapped (Signed)
Problem: Mobility Impairment  Goal: Optimal Mobility  Outcome: Ongoing - Unchanged  Intervention: Optimize Mobility  Recent Flowsheet Documentation  Taken 11/26/2020 2300 by Ila Mcgill, RN  Positioning/Transfer Devices: pillows     Problem: Adult Inpatient Plan of Care  Goal: Absence of Hospital-Acquired Illness or Injury  Outcome: Ongoing - Unchanged  Intervention: Identify and Manage Fall Risk  Flowsheets (Taken 11/27/2020 0007)  Safety Interventions:   low bed   lighting adjusted for tasks/safety   nonskid shoes/slippers when out of bed   bed alarm  Intervention: Prevent Skin Injury  Flowsheets (Taken 11/27/2020 0007)  Skin Protection:   adhesive use limited   incontinence pads utilized     Problem: Anemia  Goal: Anemia Symptom Improvement  Outcome: Ongoing - Unchanged  Intervention: Monitor and Manage Anemia  Recent Flowsheet Documentation  Taken 11/27/2020 0007 by Ila Mcgill, RN  Safety Interventions:   low bed   lighting adjusted for tasks/safety   nonskid shoes/slippers when out of bed   bed alarm  Taken 11/26/2020 2300 by Ila Mcgill, RN  Fatigue Management: activity assistance provided

## 2020-11-27 NOTE — Unmapped (Addendum)
Daily Progress Note    ASSESSMENT    Principal Problem:    Hepatic encephalopathy (CMS-HCC)  Active Problems:    Cirrhosis (CMS-HCC)    GAVE (gastric antral vascular ectasia)    Type 2 diabetes mellitus without complication, with long-term current use of insulin (CMS-HCC)    Pulmonary hypertension (CMS-HCC)    Thrombocytopenia (CMS-HCC)    AKI (acute kidney injury) (CMS-HCC)  Resolved Problems:    * No resolved hospital problems. *        LOS: 1 day     PLAN    Weekend coverage for [facility] internal medicine    1.  Hepatic encephalopathy  Continue lactulose and rifaximin patient has NG tube in place, will see if we can discontinue this later, she is more awake and alert.  Platelets are 45, hemoglobin stable at 9.6, no signs of bleeding  Creatinine stable at 1.73  Ammonia level down to 55 today  Bilirubin up to 2.9, no LFT elevation, PT/INR stable  As patient is more alert and awake would like to advance diet if tolerated     2.  Gastric antral vascular ectasia  Secondary to hepatic cirrhosis  Monitor for any signs or symptoms of bleeding, hemoglobin currently stable, platelets low at 45.    3.  Thrombocytopenia  Secondary to hepatic cirrhosis   continue supportive management, monitor for signs of bleeding  Hemoglobin stable    4.  CKD  Creatinine at last discharge 1.59  Up to 1.73, monitor fluid status  Monitor strict I&O's, and urine output    Subjective:     Marilyn French is a 65 y.o. female here for evaluation and treatment of hepatic encephalopathy.  Patient recently discharged with the same, unable to follow-up with home health agency as insurance would not cover.  Patient returned to ED with increased confusion and falls and ammonia level greater than 100.  Mental status is improving today, she is able to tell me who she is and where she is.  She does not remember any events from yesterday.  She states that her throat is a little sore from the NG tube.  She has no other acute complaints this morning.    Objective:     Vitals:    11/27/20 0000 11/27/20 0410 11/27/20 0733 11/27/20 0750   BP: 134/37 143/38 116/38 116/38   Pulse: 83  81 81   Resp: 18 18 17 17    Temp: 36.5 ??C (97.7 ??F) 36.6 ??C (97.9 ??F) 37 ??C (98.6 ??F) 37 ??C (98.6 ??F)   TempSrc: Oral Oral Oral    SpO2: 97% 96% 92% 93%   Weight:    87.6 kg (193 lb 2 oz)   Height:    160 cm (5' 2.99)        INTAKE AND OUTPUT:  I/O  Timeline      03/10 0701  03/11 0700 03/11 0701  03/12 0700 03/12 0701  03/13 0700           Stool Occurrence  2 x             Physical Exam  Constitutional:       General: She is not in acute distress.     Appearance: She is ill-appearing. She is not toxic-appearing.      Comments: Drowsy but easily awakens, chronically ill-appearing, appears much older than stated age   HENT:      Head: Normocephalic and atraumatic.      Mouth/Throat:  Mouth: Mucous membranes are dry.      Pharynx: Oropharynx is clear. No oropharyngeal exudate.   Cardiovascular:      Rate and Rhythm: Normal rate and regular rhythm.      Pulses: Normal pulses.      Heart sounds: Normal heart sounds. No murmur heard.    No friction rub. No gallop.   Pulmonary:      Effort: Pulmonary effort is normal. No respiratory distress.      Breath sounds: Normal breath sounds. No wheezing or rhonchi.   Abdominal:      General: Bowel sounds are normal. There is distension.      Tenderness: There is abdominal tenderness (Right upper quadrant tenderness, no rebound or guarding). There is no guarding or rebound.   Musculoskeletal:      Comments: Globally weak   Skin:     General: Skin is warm and dry.      Capillary Refill: Capillary refill takes 2 to 3 seconds.   Neurological:      General: No focal deficit present.      Mental Status: She is disoriented.   Psychiatric:      Comments: Drowsy, unable to fully assess          CURRENT MEDICATIONS:    Current Facility-Administered Medications:   ???  bumetanide (BUMEX) tablet 2 mg, 2 mg, Oral, Daily, Dhruv B [name], MD, 2 mg at 11/27/20 1308  ???  busPIRone (BUSPAR) tablet 10 mg, 10 mg, Oral, BID, Ignatius Specking, MD, 10 mg at 11/27/20 0615  ???  cefTRIAXone in dextrose (premix) (ROCEPHIN) 1 gram/50 mL IVPB 1 g, 1 g, Intravenous, Q24H, Jetty Peeks, FNP  ???  dextrose (GLUTOSE) 40 % gel 15 g of dextrose, 15 g of dextrose, Oral, Q5 Min PRN, Jetty Peeks, FNP  ???  dextrose (GLUTOSE) 40 % gel 30 g of dextrose, 30 g of dextrose, Oral, Q5 Min PRN, Jetty Peeks, FNP  ???  dextrose 5 % and sodium chloride 0.45 % infusion, 50 mL/hr, Intravenous, Continuous, Jetty Peeks, FNP, Last Rate: 50 mL/hr at 11/26/20 1851, 50 mL/hr at 11/26/20 1851  ???  dextrose 50 % in water (D50W) 50 % solution 12.5 g, 12.5 g, Intravenous, Q15 Min PRN, Jetty Peeks, FNP  ???  dextrose 50 % in water (D50W) 50 % solution 25 g, 25 g, Intravenous, Q15 Min PRN, Jetty Peeks, FNP  ???  glucagon injection 1 mg, 1 mg, Intramuscular, Once PRN, Jetty Peeks, FNP  ???  insulin lispro (HumaLOG) injection 0-15 Units, 0-15 Units, Subcutaneous, TID AC, Jetty Peeks, FNP, 3 Units at 11/27/20 0730  ???  lactulose (CHRONULAC) oral solution (30 mL cup), 45 g, Oral, QID, Ignatius Specking, MD, 45 g at 11/27/20 0617  ???  pantoprazole (PROTONIX) EC tablet 40 mg, 40 mg, Oral, Daily, Dhruv B [name], MD, 40 mg at 11/27/20 0927  ???  rifAXIMin (XIFAXAN) tablet 550 mg, 550 mg, Oral, BID, Dhruv B [name], MD, 550 mg at 11/27/20 6578  ???  spironolactone (ALDACTONE) tablet 100 mg, 100 mg, Oral, Daily, Dhruv B [name], MD, 100 mg at 11/27/20 0927     LABS:  Blood  Recent Labs   Lab Units 11/27/20  0636 11/26/20  1337   WBC 10*9/L 3.9* 4.5   HEMOGLOBIN g/dL 9.6* 46.9*   HEMATOCRIT % 30.9* 32.0*   PLATELET COUNT (1) 10*9/L 45* 46*     Recent Labs   Lab Units 11/27/20  7829 11/26/20  1415 11/26/20  1337   SODIUM mmol/L 143  --  141   POTASSIUM mmol/L 3.5  --  4.0   CHLORIDE mmol/L 111*  --  110*   CO2 mmol/L 21.6  --  24.2   BUN mg/dL 28*  --  25*   CREATININE mg/dL 5.62*  -- 1.30*   GLUCOSE mg/dL 865*  --  82   CALCIUM mg/dL 8.0*  --  8.2*   ALBUMIN g/dL 1.8*  --  1.9*   PROTEIN TOTAL g/dL 4.3*  --  4.6*   BILIRUBIN TOTAL mg/dL 2.9*  --  2.8*   AST U/L 37  --  48*   ALT U/L 25  --  27   ALK PHOS U/L 95  --  95   MAGNESIUM mg/dL  --   --  2.0   AMMONIA umol/L 55* 147*  --      No results in the last week  Recent Labs   Lab Units 11/26/20  1447   INR  1.31   APTT sec 25.9     No results in the last weekNo results in the last week  Urine  Recent Labs   Lab Units 11/26/20  1347   WBC UA /HPF 10*   NITRITE UA  Positive*   LEUKOCYTES UA  Negative   BACTERIA UA /HPF Large*   BLOOD UA  Trace*   GLUCOSE UA  Negative   PROTEIN UA  Negative   KETONES UA  Negative      Body Fluids  No results in the last week  ABG  No results for input(s): O2SOUR, FIO2ART, PHART, PCO2ART, PO2ART, HCO3ART, O2SATART, BEART in the last 72 hours.  Microbiology Results (last day)     Procedure Component Value Date/Time Date/Time    Blood Culture #2 [[id number]] Collected: 11/26/20 1606    Lab Status: In process Specimen: Blood from 1 Peripheral Draw Updated: 11/26/20 1609    Blood Culture #1 [[id number]] Collected: 11/26/20 1559    Lab Status: In process Specimen: Blood from 1 Peripheral Draw Updated: 11/26/20 1609    Respiratory Pathogen Panel with COVID-19 [[id number]]  (Normal) Collected: 11/26/20 1347    Lab Status: Final result Specimen: Nasopharyngeal Swab Updated: 11/26/20 1549     Adenovirus Not Detected     Coronavirus PCR (229E, HKU1, NL63, OC43) Not Detected     Metapneumovirus Not Detected     Rhinovirus/Enterovirus Not Detected     Influenza A Not Detected     Influenza A/H1 Not Detected     Influenza A/H3 Not Detected     Influenza A/H1-2009 Not Detected     Influenza B Not Detected     Parainfluenza 1 Not Detected     Parainfluenza 2 Not Detected     Parainfluenza 3 Not Detected     Parainfluenza 4 Not Detected     SARS-CoV-2 PCR Not Detected     RSV A PCR Not Detected     RSV B PCR Not Detected Chlamydophila (Chlamydia) pneumoniae Not Detected     Mycoplasma pneumoniae Not Detected    Narrative:      Testing was performed using the [employer] Dx Respiratory Panel 2 (RP2). Performance characteristics have been verified by the [facility]. A negative result does not rule out the possibility of infection and should be used in conjunction with other clinical findings as well as patient history. Positive results do not rule out co-infection with organisms not included in  the RP2 panel. This assay does not distinguish between rhinovirus and enterovirus.    This test has not been FDA cleared or approved, and has been authorized by FDA under an Emergency Use Authorization (EUA). This test is only authorized for the duration of time the declaration that circumstances exist justifying the authorization of the emergency use of in vitro diagnostic tests for detection of SARS-CoV-2 virus and/or diagnosis of COVID-19 infection under section 564(b)(1) of the Act, 21 U.S.C. 161WRU-0(A)(5), unless the authorization is terminated or revoked sooner.    For Providers:  [employer]  For Patients:  [email or web address]    Urine Culture [[id number]] Collected: 11/26/20 1347    Lab Status: In process Specimen: Urine from Catheterized-In and Out Catheter Updated: 11/26/20 1439         IMAGING:  ECG 12 lead (Adult)    Result Date: 11/26/2020  Sinus rhythm with sinus arrhythmia with 1st degree AV block Left anterior fascicular block Possible Anterior infarct , age undetermined Abnormal ECG No previous ECGs available Confirmed by Gwenyth Bouillon ([id number]) on 11/26/2020 3:53:54 PM    XR Chest Portable    Result Date: 11/26/2020  CLINICAL DATA:  Catheter placement     EXAM: PORTABLE CHEST 1 VIEW     COMPARISON:  X-ray earlier in the same day     FINDINGS: The enteric tube appears to project over the gastric body. The tip is pointed distally. The lung volumes are low. The heart size is stable. Bibasilar atelectasis is noted. The right subclavian Port-A-Cath is unchanged. No pneumothorax although evaluation is limited by patient positioning.         Lines and tubes as above. No pneumothorax. Persistent bibasilar atelectasis and/or infiltrate.         Electronically Signed   By: Katherine Mantle M.D.   On: 11/26/2020 17:00         XR Chest Portable    Result Date: 11/26/2020  CLINICAL DATA:  Altered mental status, liver failure, fall     EXAM: PORTABLE CHEST 1 VIEW     COMPARISON:  11/14/2020     FINDINGS: Right chest port catheter. Cardiomegaly. Mild, diffuse interstitial pulmonary opacity. The visualized skeletal structures are unremarkable.         Cardiomegaly with mild, diffuse interstitial pulmonary opacity, which may reflect mild edema. No focal airspace opacity.         Electronically Signed   By: Lauralyn Primes M.D.   On: 11/26/2020 14:18         CT head without contrast    Result Date: 11/26/2020  CLINICAL DATA:  Mental status change.     EXAM: CT HEAD WITHOUT CONTRAST     TECHNIQUE: Contiguous axial images were obtained from the base of the skull through the vertex without intravenous contrast.     COMPARISON:  CT head 11/14/2020     FINDINGS: Brain: No evidence of acute infarction, hemorrhage, hydrocephalus, extra-axial collection or mass lesion/mass effect.     Vascular: Negative for hyperdense vessel     Skull: Negative     Sinuses/Orbits: Mild mucosal edema paranasal sinuses. Negative orbit     Other: None         Negative CT of the brain.     Mild mucosal edema paranasal sinuses.         Electronically Signed   By: Marlan Palau M.D.   On: 11/26/2020 14:53         XR Abdomen 1 View  Result Date: 11/26/2020  CLINICAL DATA:  NG tube placement.     EXAM: ABDOMEN - 1 VIEW     COMPARISON:  Jan 26, 2021     FINDINGS: The enteric tube tip projects over the gastric body. The tip is pointed distally. The tube is now likely kinked at the side port. The Port-A-Cath is unchanged. The lung fields are essentially unchanged. More conspicuous on this examination is a TIPS stent projecting over the patient's liver. There appears to be an Amplatzer plug projecting over the mid abdomen.         Tip of the enteric tube projects over the gastric body. The tube appears to be kinked at the side port.         Electronically Signed   By: Katherine Mantle M.D.   On: 11/26/2020 17:28            Elenora Fender, DO   11/27/2020

## 2020-11-27 NOTE — Unmapped (Signed)
Medicine History and Physical    Assessment/Plan:    Principal Problem:    Hepatic encephalopathy (CMS-HCC)  Active Problems:    Cirrhosis (CMS-HCC)    GAVE (gastric antral vascular ectasia)    Type 2 diabetes mellitus without complication, with long-term current use of insulin (CMS-HCC)    Pulmonary hypertension (CMS-HCC)    Thrombocytopenia (CMS-HCC)    AKI (acute kidney injury) (CMS-HCC)  Resolved Problems:    * No resolved hospital problems. *      Plan  Patient is admitted to the hospital as inpatient.  2 night stay required.  Lactulose started again.  Patient has recurrent falls since he has been home.  Home health and home physical therapy was attempted to be set up over the last 3 days since the discharge by discharge planner.  I had multiple communication with the patient family and the case management.  It appears that her insurance does not cover the home health agency as well and they declined to accept this patient to provide the services.  Patient need to be on IV fluids currently.  Protonix Xifaxan and lactulose will be given.  NG tube placed by emergency room.  If she is more awake tomorrow NG tube could be removed.  In the future need to be cautious with the NG tube as this patient has esophageal varices also.  Repeat CBC be met in the morning.  Palliative care consult.  Husband wants everything to be done.  Patient remains DNR though.  __________________________________________________________________    Chief Complaint:  Chief Complaint   Patient presents with   ??? Altered Mental Status   ??? Fall   ??? Abnormal Lab       HPI:  Marilyn French is a 65 y.o. female with PMHx as reviewed in the EMR that presented to [facility] with Altered Mental Status, Fall, and Abnormal Lab   . She  is being admitted with Hepatic encephalopathy (CMS-HCC).    Change in mental status confusion.  Patient is a recurrent fall.  Patient was brought into the emergency room appears to be more confused.  Patient denies any other symptoms.  No chest pain or palpitation.  Patient has been home since last few days.  Patient initially started taking medication but progressively was getting weaker and had recurrent fall.  Home health agency declined to participate in this patient care because of insurance..  Most home health agency has declined to provide service because of a healthy [employer] insurance  Patient is back in the hospital again as her husband cannot handle without the help.  Patient needed ammonia level to be checked 2 days ago.  He could not bring her to the office.  Home health could have been participating this patient care which has not been accomplished due to insurance reasons.  Patient is not able to give meaningful history at this time.  Continue current treatment plan as detailed above.      Ferumoxytol, Definity [perflutren lipid microspheres], Other, Statins-hmg-coa reductase inhibitors, and Tramadol hcl  Prior to Admission medications    Medication Dose, Route, Frequency   bumetanide (BUMEX) 2 MG tablet Take 1 tablet by mouth twice daily   busPIRone (BUSPAR) 10 MG tablet Take 1 tablet by mouth twice daily   cholecalciferol, vitamin D3, (CHOLECALCIFEROL) 1,000 unit tablet 1,000 Units, Oral, Every other day   ferrous sulfate 325 (65 FE) MG tablet 325 mg, Oral, Daily (standard)   insulin glargine (LANTUS SOLOSTAR U-100 INSULIN) 100  unit/mL (3 mL) injection pen 45 Units, Subcutaneous, Daily (standard)  Patient taking differently: Inject 60 Units under the skin daily.    insulin lispro (HUMALOG) 100 unit/mL injection INJECT 15 TO 35 UNITS SUBCUTANEUOSLY PER SLIDING SCALE 3 TIMES DAILY    lactulose (CHRONULAC) 10 gram/15 mL solution 45 g, Oral, 4 times a day   levothyroxine (SYNTHROID, LEVOTHROID) 25 MCG tablet 25 mcg, Oral, Daily (standard)   lidocaine-prilocaine (EMLA) 2.5-2.5 % cream Topical   MAGNESIUM ORAL Oral   pantoprazole (PROTONIX) 40 MG tablet 40 mg, Oral, Daily (standard)   pediatric multivit-iron-min (FLINTSTONES COMPLETE) tablet 2 tablets, Oral, Daily (standard)   pen needle, diabetic 31 gauge x 1/4 Ndle No dose, route, or frequency recorded.   potassium chloride SA (K-DUR,KLOR-CON) 20 MEQ tablet 20 mEq, Oral, Daily (standard)   propranoloL (INDERAL) 20 MG tablet Daily   rifAXIMin (XIFAXAN) 550 mg Tab 550 mg, Oral, 2 times a day (standard)   rosuvastatin (CRESTOR) 5 MG tablet 5 mg, Oral, Every other day   sertraline (ZOLOFT) 50 MG tablet Take 1 tablet by mouth once daily   spironolactone (ALDACTONE) 100 MG tablet Take 1 tablet by mouth once daily   ZINC ACETATE ORAL Oral     Past Medical History:   Diagnosis Date   ??? Cirrhosis (CMS-HCC)    ??? Depression    ??? Diabetes mellitus (CMS-HCC)    ??? Fibromyalgia    ??? GAVE (gastric antral vascular ectasia)    ??? History of transfusion 04/2019    reports frequent blood tranfusions   ??? HTN (hypertension)    ??? Hypercholesterolemia    ??? Hypothyroid    ??? NAFLD (nonalcoholic fatty liver disease)    ??? Osteoporosis      Past Surgical History:   Procedure Laterality Date   ??? APPENDECTOMY     ??? CHOLECYSTECTOMY     ??? IR TIPS  07/08/2018    IR TIPS 07/08/2018 Soledad Gerlach, MD IMG VIR H&V [facility]   ??? PR COLSC FLEXIBLE W/CONTROL BLEEDING ANY METHOD N/A 10/17/2019    Procedure: COLONOSCOPY, FLEXIBLE, PROXIMAL TO SPLENIC FLEXURE; DX, W/CONTROL OF BLEEDING;  Surgeon: Jules Husbands, MD;  [facility];  Service: Gastroenterology   ??? PR RIGHT HEART CATH O2 SATURATION & CARDIAC OUTPUT Right 01/18/2018    Procedure: Right Heart Catheterization;  Surgeon: Marlaine Hind, MD;  Location: [facility] CATH;  Service: Cardiology   ??? PR UPPER GI ENDOSCOPY,CTRL BLEED N/A 03/05/2019    Procedure: UGI ENDOSCOPY; WITH CONTROL OF BLEEDING, ANY METHOD;  Surgeon: Andrey Farmer, MD;  [facility]: [facility];  Service: Gastroenterology   ??? PR UPPER GI ENDOSCOPY,CTRL BLEED  04/04/2019    Procedure: UGI ENDOSCOPY; WITH CONTROL OF BLEEDING, ANY METHOD;  Surgeon: Janyth Pupa, MD;  [facility];  Service: Gastroenterology   ??? PR UPPER GI ENDOSCOPY,CTRL BLEED N/A 05/23/2019    Procedure: UGI ENDOSCOPY; WITH CONTROL OF BLEEDING, ANY METHOD;  Surgeon: Janyth Pupa, MD;  [facility];  Service: Gastroenterology   ??? PR UPPER GI ENDOSCOPY,CTRL BLEED N/A 06/13/2019    Procedure: UGI ENDOSCOPY; WITH CONTROL OF BLEEDING, ANY METHOD;  Surgeon: Annie Paras, MD;  Location: [facility];  Service: Gastroenterology   ??? PR UPPER GI ENDOSCOPY,CTRL BLEED N/A 07/11/2019    Procedure: UGI ENDOSCOPY; WITH CONTROL OF BLEEDING, ANY METHOD;  Surgeon: Annie Paras, MD;  Location: [facility];  Service: Gastroenterology   ???  PR UPPER GI ENDOSCOPY,CTRL BLEED N/A 09/16/2019    Procedure: UGI ENDOSCOPY; WITH CONTROL OF BLEEDING, ANY METHOD;  Surgeon: Beverly Milch, MD;  [facility]: [facility];  Service: Gastrointestinal   ??? PR UPPER GI ENDOSCOPY,CTRL BLEED N/A 10/07/2019    Procedure: UGI ENDOSCOPY; WITH CONTROL OF BLEEDING, ANY METHOD;  Surgeon: Pia Mau, MD;  [facility]: [facility];  Service: Gastroenterology   ??? PR UPPER GI ENDOSCOPY,CTRL BLEED N/A 01/02/2020    Procedure: UGI ENDOSCOPY; WITH CONTROL OF BLEEDING, ANY METHOD;  Surgeon: Janyth Pupa, MD;  Location: [facility];  Service: Gastroenterology   ??? PR UPPER GI ENDOSCOPY,CTRL BLEED N/A 04/09/2020    Procedure: UGI ENDOSCOPY; WITH CONTROL OF BLEEDING, ANY METHOD;  Surgeon: Janyth Pupa, MD;  [facility]: [facility];  Service: Gastroenterology   ??? PR UPPER GI ENDOSCOPY,DIAGNOSIS N/A 03/03/2019    Procedure: UGI ENDO, INCLUDE ESOPHAGUS, STOMACH, & DUODENUM &/OR JEJUNUM; DX W/WO COLLECTION SPECIMN, BY BRUSH OR WASH;  Surgeon: Luanne Bras, MD;  Location: [facility];  Service: Gastroenterology   ??? PR UPPER GI ENDOSCOPY,LIGAT VARIX N/A 01/02/2020    Procedure: UGI ENDO; Everlene Balls LIG ESOPH &/OR GASTRIC VARICES;  Surgeon: Janyth Pupa, MD;  Location: [facility];  Service: Gastroenterology   ??? PR UPPER GI ENDOSCOPY,LIGAT VARIX N/A 04/09/2020    Procedure: UGI ENDO; W/BAND LIG ESOPH &/OR GASTRIC VARICES;  Surgeon: Janyth Pupa, MD;  [facility]: [facility];  Service: Gastroenterology   ??? PR UPPER GI ENDOSCOPY,LIGAT VARIX N/A 06/18/2020    Procedure: UGI ENDO; Everlene Balls LIG ESOPH &/OR GASTRIC VARICES;  Surgeon: Janyth Pupa, MD;  Location: [facility];  Service: Gastroenterology   ??? SALPINGOOPHORECTOMY       Social History     Socioeconomic History   ??? Marital status: Married     Spouse name: Not on file   ??? Number of children: Not on file   ??? Years of education: Not on file   ??? Highest education level: Not on file   Occupational History   ??? Not on file   Tobacco Use   ??? Smoking status: Never Smoker   ??? Smokeless tobacco: Never Used   Vaping Use   ??? Vaping Use: Never used   Substance and Sexual Activity   ??? Alcohol use: No   ??? Drug use: No   ??? Sexual activity: Not on file   Other Topics Concern   ??? Not on file   Social History Narrative   ??? Not on file     Social Determinants of Health     Financial Resource Strain: Low Risk    ??? Difficulty of Paying Living Expenses: Not hard at all   Food Insecurity: No Food Insecurity   ??? Worried About Programme researcher, broadcasting/film/video in the Last Year: Never true   ??? Ran Out of Food in the Last Year: Never true   Transportation Needs: No Transportation Needs   ??? Lack of Transportation (Medical): No   ??? Lack of Transportation (Non-Medical): No   Physical Activity: Not on file   Stress: Not on file   Social Connections: Not on file     No family history on file.    Review of Systems   Unable to perform ROS: Mental status change        Labs/Studies  All lab results last 24 hours:    Recent Results (from  the past 24 hour(s))   Comprehensive Metabolic Panel    Collection Time: 11/26/20  1:37 PM   Result Value Ref Range    Sodium 141 135 - 145 mmol/L    Potassium 4.0 3.5 - 5.0 mmol/L    Chloride 110 (H) 98 - 107 mmol/L    Anion Gap 7 3 - 11 mmol/L    CO2 24.2 21.0 - 32.0 mmol/L    BUN 25 (H) 8 - 20 mg/dL    Creatinine 1.61 (H) 0.60 - 1.10 mg/dL    BUN/Creatinine Ratio 15     EGFR CKD-EPI Non-African American, Female 33 mL/min/1.2m2    EGFR CKD-EPI African American, Female 38 mL/min/1.58m2    Glucose 82 70 - 179 mg/dL    Calcium 8.2 (L) 8.5 - 10.1 mg/dL    Albumin 1.9 (L) 3.5 - 5.0 g/dL    Total Protein 4.6 (L) 6.0 - 8.0 g/dL    Total Bilirubin 2.8 (H) 0.3 - 1.2 mg/dL    AST 48 (H) 15 - 40 U/L    ALT 27 12 - 78 U/L    Alkaline Phosphatase 95 46 - 116 U/L   Magnesium Level    Collection Time: 11/26/20  1:37 PM   Result Value Ref Range    Magnesium 2.0 1.6 - 2.6 mg/dL   CBC w/ Differential    Collection Time: 11/26/20  1:37 PM   Result Value Ref Range    WBC 4.5 4.0 - 10.5 10*9/L    RBC 3.22 (L) 3.80 - 5.10 10*12/L    HGB 10.4 (L) 11.5 - 15.0 g/dL    HCT 09.6 (L) 04.5 - 44.0 %    MCV 99.4 (H) 80.0 - 98.0 fL    MCH 32.3 27.0 - 34.0 pg    MCHC 32.5 32.0 - 36.0 g/dL    RDW 40.9 (H) 81.1 - 14.5 %    MPV 12.6 (H) 7.4 - 10.4 fL    Platelet 46 (L) 140 - 415 10*9/L    Neutrophils % 67.2 %    Lymphocytes % 17.1 %    Monocytes % 13.1 %    Eosinophils % 1.3 %    Basophils % 1.1 %    Absolute Neutrophils 3.0 1.8 - 7.8 10*9/L    Absolute Lymphocytes 0.8 0.7 - 4.5 10*9/L    Absolute Monocytes 0.6 0.1 - 1.0 10*9/L    Absolute Eosinophils 0.1 0.0 - 0.4 10*9/L    Absolute Basophils 0.1 0.0 - 0.2 10*9/L    Anisocytosis Slight (A) Not Present   Morphology Review    Collection Time: 11/26/20  1:37 PM   Result Value Ref Range    Polychromasia Slight (A) Not Present   Urinalysis with Culture Reflex    Collection Time: 11/26/20  1:47 PM    Specimen: Catheterized-In and Out Catheter; Urine   Result Value Ref Range    Color, UA Amber     Clarity, UA Hazy (A) Clear    Specific Gravity, UA 1.020 1.010 - 1.025    pH, UA 5.5 5.0 - 8.0    Leukocyte Esterase, UA Negative Negative    Nitrite, UA Positive (A) Negative    Protein, UA Negative Negative    Glucose, UA Negative Negative    Ketones, UA Negative Negative    Urobilinogen, UA 0.2 mg/dL 0.1 - 1.0 mg/dL    Bilirubin, UA Negative Negative    Blood, UA Trace (A) Negative    WBC, UA 10 (H) <=5 /HPF  Squam Epithel, UA 1 0 - 10 /HPF    Bacteria, UA Large (A) Few, Occasional, Small /HPF    WBC Clumps None Seen None Seen /HPF    Hyaline Casts, UA 5 0 - 5 /LPF   Respiratory Pathogen Panel with COVID-19    Collection Time: 11/26/20  1:47 PM   Result Value Ref Range    Adenovirus Not Detected Not Detected    Coronavirus PCR (229E, HKU1, NL63, OC43) Not Detected Not Detected    Metapneumovirus Not Detected Not Detected    Rhinovirus/Enterovirus Not Detected Not Detected    Influenza A Not Detected Not Detected    Influenza A/H1 Not Detected Not Detected    Influenza A/H3 Not Detected Not Detected    Influenza A/H1-2009 Not Detected Not Detected    Influenza B Not Detected Not Detected    Parainfluenza 1 Not Detected Not Detected    Parainfluenza 2 Not Detected Not Detected    Parainfluenza 3 Not Detected Not Detected    Parainfluenza 4 Not Detected Not Detected    SARS-CoV-2 PCR Not Detected Not Detected    RSV A PCR Not Detected Not Detected    RSV B PCR Not Detected Not Detected    Chlamydophila (Chlamydia) pneumoniae Not Detected Not Detected    Mycoplasma pneumoniae Not Detected Not Detected   Ammonia    Collection Time: 11/26/20  2:15 PM   Result Value Ref Range    Ammonia 147 (H) 11 - 32 umol/L   ECG 12 lead (Adult)    Collection Time: 11/26/20  2:40 PM   Result Value Ref Range    EKG Systolic BP  mmHg    EKG Diastolic BP  mmHg    EKG Ventricular Rate 87 BPM    EKG Atrial Rate 87 BPM    EKG P-R Interval 246 ms    EKG QRS Duration 106 ms    EKG Q-T Interval 398 ms    EKG QTC Calculation 478 ms    EKG Calculated P Axis 73 degrees    EKG Calculated R Axis -57 degrees    EKG Calculated T Axis 72 degrees    QTC Fredericia 450 ms   PT-INR    Collection Time: 11/26/20  2:47 PM   Result Value Ref Range    PT 13.8 (H) 9.1 - 11.0 sec    INR 1.31 <=5.00   APTT    Collection Time: 11/26/20  2:47 PM   Result Value Ref Range    APTT 25.9 20.1 - 31.6 sec       Physical Exam  Vitals and nursing note reviewed.   Constitutional:       General: She is not in acute distress.     Appearance: Normal appearance.   HENT:      Head: Normocephalic and atraumatic.      Nose: No congestion or rhinorrhea.      Mouth/Throat:      Mouth: Mucous membranes are moist.   Eyes:      General: No scleral icterus.     Extraocular Movements: Extraocular movements intact.   Cardiovascular:      Rate and Rhythm: Normal rate and regular rhythm.      Pulses: Normal pulses.      Heart sounds: Normal heart sounds.   Pulmonary:      Effort: Pulmonary effort is normal.      Breath sounds: Normal breath sounds.   Abdominal:      General: Bowel sounds  are normal. There is no distension.      Palpations: Abdomen is soft.      Tenderness: There is no abdominal tenderness. There is no guarding.   Musculoskeletal:         General: No swelling or tenderness.      Cervical back: No rigidity or tenderness.   Neurological:      General: No focal deficit present.      Mental Status: She is alert. She is disoriented.         Vitals:    11/26/20 1700   BP:    Pulse:    Resp:    Temp: 36.8 ??C (98.2 ??F)   SpO2:    , Body mass index is 34.24 kg/m??.,      DVT prophylaxis:  no pharmacologic anticoagulation due to active bleeding or unacceptable bleeding risk  Anticipated disposition: To Home with Home Health  Estimated discharge:  2-3 days    Ignatius Specking, MD  November 26, 2020 7:49 PM

## 2020-11-27 NOTE — Unmapped (Signed)
Problem: Diabetes Comorbidity  Goal: Blood Glucose Level Within Targeted Range  Outcome: Not Progressing     Problem: Mobility Impairment  Goal: Optimal Mobility  Outcome: Not Progressing     Problem: Adult Inpatient Plan of Care  Goal: Plan of Care Review  Outcome: Not Progressing  Goal: Patient-Specific Goal (Individualized)  Outcome: Not Progressing  Goal: Absence of Hospital-Acquired Illness or Injury  Outcome: Not Progressing  Goal: Optimal Comfort and Wellbeing  Outcome: Not Progressing  Goal: Readiness for Transition of Care  Outcome: Not Progressing  Goal: Rounds/Family Conference  Outcome: Not Progressing     Problem: Fluid and Electrolyte Imbalance (Acute Kidney Injury/Impairment)  Goal: Fluid and Electrolyte Balance  Outcome: Not Progressing     Problem: Oral Intake Inadequate (Acute Kidney Injury/Impairment)  Goal: Optimal Nutrition Intake  Outcome: Not Progressing     Problem: Renal Function Impairment (Acute Kidney Injury/Impairment)  Goal: Effective Renal Function  Outcome: Not Progressing     Problem: Anemia  Goal: Anemia Symptom Improvement  Outcome: Not Progressing     Problem: Neurologic Function Impaired (Liver Failure)  Goal: Optimal Neurologic Function  Outcome: Not Progressing     Problem: Oral Intake Inadequate (Liver Failure)  Goal: Improved Oral Intake  Outcome: Not Progressing     Problem: Fall Injury Risk  Goal: Absence of Fall and Fall-Related Injury  Outcome: Not Progressing

## 2020-11-28 LAB — COMPREHENSIVE METABOLIC PANEL
ALBUMIN: 1.7 g/dL — ABNORMAL LOW (ref 3.5–5.0)
ALKALINE PHOSPHATASE: 100 U/L (ref 46–116)
ALT (SGPT): 27 U/L (ref 12–78)
ANION GAP: 10 mmol/L (ref 3–11)
AST (SGOT): 37 U/L (ref 15–40)
BILIRUBIN TOTAL: 2.3 mg/dL — ABNORMAL HIGH (ref 0.3–1.2)
BLOOD UREA NITROGEN: 25 mg/dL — ABNORMAL HIGH (ref 8–20)
BUN / CREAT RATIO: 15
CALCIUM: 7.4 mg/dL — ABNORMAL LOW (ref 8.5–10.1)
CHLORIDE: 105 mmol/L (ref 98–107)
CO2: 21.8 mmol/L (ref 21.0–32.0)
CREATININE: 1.69 mg/dL — ABNORMAL HIGH (ref 0.60–1.10)
EGFR CKD-EPI AA FEMALE: 36 mL/min/{1.73_m2}
EGFR CKD-EPI NON-AA FEMALE: 31 mL/min/{1.73_m2}
GLUCOSE RANDOM: 296 mg/dL — ABNORMAL HIGH (ref 70–179)
POTASSIUM: 3.2 mmol/L — ABNORMAL LOW (ref 3.5–5.0)
PROTEIN TOTAL: 4.1 g/dL — ABNORMAL LOW (ref 6.0–8.0)
SODIUM: 137 mmol/L (ref 135–145)

## 2020-11-28 LAB — CBC W/ AUTO DIFF
BASOPHILS ABSOLUTE COUNT: 0 10*9/L (ref 0.0–0.2)
BASOPHILS RELATIVE PERCENT: 1 %
EOSINOPHILS ABSOLUTE COUNT: 0.1 10*9/L (ref 0.0–0.4)
EOSINOPHILS RELATIVE PERCENT: 2.1 %
HEMATOCRIT: 29.7 % — ABNORMAL LOW (ref 34.0–44.0)
HEMOGLOBIN: 9.4 g/dL — ABNORMAL LOW (ref 11.5–15.0)
LYMPHOCYTES ABSOLUTE COUNT: 0.7 10*9/L (ref 0.7–4.5)
LYMPHOCYTES RELATIVE PERCENT: 19.1 %
MEAN CORPUSCULAR HEMOGLOBIN CONC: 31.6 g/dL — ABNORMAL LOW (ref 32.0–36.0)
MEAN CORPUSCULAR HEMOGLOBIN: 32.2 pg (ref 27.0–34.0)
MEAN CORPUSCULAR VOLUME: 101.7 fL — ABNORMAL HIGH (ref 80.0–98.0)
MEAN PLATELET VOLUME: 11.6 fL — ABNORMAL HIGH (ref 7.4–10.4)
MONOCYTES ABSOLUTE COUNT: 0.5 10*9/L (ref 0.1–1.0)
MONOCYTES RELATIVE PERCENT: 12 %
NEUTROPHILS ABSOLUTE COUNT: 2.5 10*9/L (ref 1.8–7.8)
NEUTROPHILS RELATIVE PERCENT: 65.5 %
PLATELET COUNT: 29 10*9/L — CL (ref 140–415)
RED BLOOD CELL COUNT: 2.92 10*12/L — ABNORMAL LOW (ref 3.80–5.10)
RED CELL DISTRIBUTION WIDTH: 20.7 % — ABNORMAL HIGH (ref 11.5–14.5)
WBC ADJUSTED: 3.8 10*9/L — ABNORMAL LOW (ref 4.0–10.5)

## 2020-11-28 LAB — AMMONIA: AMMONIA: 52 umol/L — ABNORMAL HIGH (ref 11–32)

## 2020-11-28 MED ADMIN — potassium chloride (KLOR-CON) packet 20 mEq: 20 meq | ORAL | @ 13:00:00 | Stop: 2020-11-28

## 2020-11-28 MED ADMIN — busPIRone (BUSPAR) tablet 10 mg: 10 mg | ORAL | @ 18:00:00

## 2020-11-28 MED ADMIN — bumetanide (BUMEX) tablet 2 mg: 2 mg | ORAL | @ 13:00:00 | Stop: 2020-12-02

## 2020-11-28 MED ADMIN — rifAXIMin (XIFAXAN) tablet 550 mg: 550 mg | ORAL | @ 02:00:00 | Stop: 2020-12-16

## 2020-11-28 MED ADMIN — lactulose (CHRONULAC) oral solution (30 mL cup): 45 g | ORAL | @ 11:00:00

## 2020-11-28 MED ADMIN — pantoprazole (PROTONIX) EC tablet 40 mg: 40 mg | ORAL | @ 13:00:00 | Stop: 2020-12-04

## 2020-11-28 MED ADMIN — lactulose (CHRONULAC) oral solution (30 mL cup): 45 g | ORAL | @ 21:00:00

## 2020-11-28 MED ADMIN — insulin lispro (HumaLOG) injection 0-15 Units: 0-15 [IU] | SUBCUTANEOUS | @ 21:00:00

## 2020-11-28 MED ADMIN — spironolactone (ALDACTONE) tablet 100 mg: 100 mg | ORAL | @ 13:00:00 | Stop: 2020-12-04

## 2020-11-28 MED ADMIN — lactulose (CHRONULAC) oral solution (30 mL cup): 45 g | ORAL | @ 02:00:00

## 2020-11-28 MED ADMIN — cefTRIAXone in dextrose (premix) (ROCEPHIN) 1 gram/50 mL IVPB 1 g: 1 g | INTRAVENOUS | @ 21:00:00 | Stop: 2020-12-04

## 2020-11-28 MED ADMIN — insulin lispro (HumaLOG) injection 0-15 Units: 0-15 [IU] | SUBCUTANEOUS | @ 18:00:00

## 2020-11-28 MED ADMIN — insulin lispro (HumaLOG) injection 0-15 Units: 0-15 [IU] | SUBCUTANEOUS | @ 12:00:00

## 2020-11-28 MED ADMIN — busPIRone (BUSPAR) tablet 10 mg: 10 mg | ORAL | @ 11:00:00

## 2020-11-28 MED ADMIN — lactulose (CHRONULAC) oral solution (30 mL cup): 45 g | ORAL | @ 16:00:00

## 2020-11-28 MED ADMIN — rifAXIMin (XIFAXAN) tablet 550 mg: 550 mg | ORAL | @ 13:00:00 | Stop: 2020-12-16

## 2020-11-28 NOTE — Unmapped (Signed)
Problem: Mobility Impairment  Goal: Optimal Mobility  Outcome: Ongoing - Unchanged  Intervention: Optimize Mobility  Recent Flowsheet Documentation  Taken 11/27/2020 2100 by Ila Mcgill, RN  Positioning/Transfer Devices: pillows     Problem: Adult Inpatient Plan of Care  Goal: Plan of Care Review  Outcome: Ongoing - Unchanged  Flowsheets (Taken 11/27/2020 2237)  Progress: no change  Plan of Care Reviewed With: patient     Problem: Adult Inpatient Plan of Care  Goal: Absence of Hospital-Acquired Illness or Injury  Outcome: Ongoing - Unchanged  Intervention: Prevent Skin Injury  Recent Flowsheet Documentation  Taken 11/27/2020 2100 by Ila Mcgill, RN  Skin Protection: adhesive use limited  Intervention: Prevent and Manage VTE (Venous Thromboembolism) Risk  Recent Flowsheet Documentation  Taken 11/27/2020 2100 by Ila Mcgill, RN  VTE Prevention/Management: ambulation promoted

## 2020-11-28 NOTE — Unmapped (Signed)
Daily Progress Note    ASSESSMENT    Principal Problem:    Hepatic encephalopathy (CMS-HCC)  Active Problems:    Cirrhosis (CMS-HCC)    GAVE (gastric antral vascular ectasia)    Type 2 diabetes mellitus without complication, with long-term current use of insulin (CMS-HCC)    Pulmonary hypertension (CMS-HCC)    Thrombocytopenia (CMS-HCC)    AKI (acute kidney injury) (CMS-HCC)  Resolved Problems:    * No resolved hospital problems. *        LOS: 2 days     PLAN    Weekend coverage for [facility] internal medicine    1.  Hepatic encephalopathy  Continue lactulose and rifaximin patient has NG tube in place, we will discontinue NG tube today as patient is awake alert and swallowing her oral medications.  Platelets are 29, hemoglobin stable at 9.4, no signs of bleeding  Creatinine stable at 1.69  Ammonia level down to 52 today  Bilirubin decreasing, down to 2.3, no LFT elevation, PT/INR stable  As patient is more alert and awake would like to advance diet if tolerated     2.  Gastric antral vascular ectasia  Secondary to hepatic cirrhosis  Monitor for any signs or symptoms of bleeding, hemoglobin currently stable, platelets low at 45.    3.  Thrombocytopenia  Secondary to hepatic cirrhosis   continue supportive management, monitor for signs of bleeding  Platelets have decreased  Hemoglobin stable    4.  CKD  Creatinine at last discharge 1.59  Up to 1.73 now trending back down, monitor fluid status  Monitor strict I&O's, and urine output    Continue with supportive care, patient would benefit from palliative services.    Subjective:     Marilyn French is a 65 y.o. female here for evaluation and treatment of hepatic encephalopathy.  Patient recently discharged with the same, unable to follow-up with home health agency as insurance would not cover.  Patient returned to ED with increased confusion and falls and ammonia level greater than 100.  Mental status is improved and appears to be near baseline.  She does not remember any events from yesterday, or events leading up to hospitalization.  She continues to report that her throat feels sore secondary to the NG tube, she denies any other acute complaints at this time.    Objective:     Vitals:    11/27/20 1930 11/27/20 2304 11/28/20 0441 11/28/20 0800   BP: 124/58 142/47 143/52 132/40   Pulse: 89 86 80 87   Resp: 16 20 18 18    Temp: 36.4 ??C (97.6 ??F) 36.5 ??C (97.7 ??F) 36.3 ??C (97.4 ??F) 36.6 ??C (97.8 ??F)   TempSrc: Oral Oral Oral Oral   SpO2: 96% 92% 92% 94%   Weight:       Height:            INTAKE AND OUTPUT:  I/O  Timeline      03/11 0701  03/12 0700 03/12 0701  03/13 0700 03/13 0701  03/14 0700    P.O.  480 240    Total Intake  480 240    Net  +480 +240           Stool Occurrence 2 x 6 x 1 x            Physical Exam  Constitutional:       General: She is not in acute distress.     Appearance: She is ill-appearing. She is not  toxic-appearing.      Comments: Chronically ill, appears older than stated age   HENT:      Head: Normocephalic and atraumatic.      Mouth/Throat:      Mouth: Mucous membranes are dry.      Pharynx: Oropharynx is clear. No oropharyngeal exudate.   Cardiovascular:      Rate and Rhythm: Normal rate and regular rhythm.      Pulses: Normal pulses.      Heart sounds: Normal heart sounds. No murmur heard.    No friction rub. No gallop.   Pulmonary:      Effort: Pulmonary effort is normal. No respiratory distress.      Breath sounds: Normal breath sounds. No wheezing or rhonchi.   Abdominal:      General: Bowel sounds are normal. There is no distension.      Tenderness: There is no abdominal tenderness (Tenderness has improved, no guarding or rebound). There is no guarding or rebound.   Musculoskeletal:      Comments: Globally weak   Skin:     General: Skin is warm and dry.      Capillary Refill: Capillary refill takes 2 to 3 seconds.   Neurological:      General: No focal deficit present.      Mental Status: She is alert. Mental status is at baseline.   Psychiatric: Comments: Much more alert and conversant today          CURRENT MEDICATIONS:    Current Facility-Administered Medications:   ???  bumetanide (BUMEX) tablet 2 mg, 2 mg, Oral, Daily, Dhruv B [name], MD, 2 mg at 11/28/20 0851  ???  busPIRone (BUSPAR) tablet 10 mg, 10 mg, Oral, BID, Ignatius Specking, MD, 10 mg at 11/28/20 0602  ???  cefTRIAXone in dextrose (premix) (ROCEPHIN) 1 gram/50 mL IVPB 1 g, 1 g, Intravenous, Q24H, Jetty Peeks, FNP, Stopped at 11/27/20 1840  ???  dextrose (GLUTOSE) 40 % gel 15 g of dextrose, 15 g of dextrose, Oral, Q5 Min PRN, Jetty Peeks, FNP  ???  dextrose (GLUTOSE) 40 % gel 30 g of dextrose, 30 g of dextrose, Oral, Q5 Min PRN, Jetty Peeks, FNP  ???  dextrose 5 % and sodium chloride 0.45 % infusion, 50 mL/hr, Intravenous, Continuous, Jetty Peeks, [place], Last Rate: 50 mL/hr at 11/27/20 1428, 50 mL/hr at 11/27/20 1428  ???  dextrose 50 % in water (D50W) 50 % solution 12.5 g, 12.5 g, Intravenous, Q15 Min PRN, Jetty Peeks, FNP  ???  dextrose 50 % in water (D50W) 50 % solution 25 g, 25 g, Intravenous, Q15 Min PRN, Jetty Peeks, FNP  ???  glucagon injection 1 mg, 1 mg, Intramuscular, Once PRN, Jetty Peeks, FNP  ???  insulin lispro (HumaLOG) injection 0-15 Units, 0-15 Units, Subcutaneous, TID AC, Jetty Peeks, FNP, 5 Units at 11/28/20 0730  ???  lactulose (CHRONULAC) oral solution (30 mL cup), 45 g, Oral, QID, Ignatius Specking, MD, 45 g at 11/28/20 0602  ???  pantoprazole (PROTONIX) EC tablet 40 mg, 40 mg, Oral, Daily, Dhruv B [name], MD, 40 mg at 11/28/20 0851  ???  rifAXIMin (XIFAXAN) tablet 550 mg, 550 mg, Oral, BID, Dhruv B [name], MD, 550 mg at 11/28/20 2841  ???  spironolactone (ALDACTONE) tablet 100 mg, 100 mg, Oral, Daily, Ignatius Specking, MD, 100 mg at 11/28/20 3244     LABS:  Blood  Recent Labs   Lab Units  11/28/20  0452 11/27/20  0636 11/26/20  1337   WBC 10*9/L 3.8* 3.9* 4.5   HEMOGLOBIN g/dL 9.4* 9.6* 16.1*   HEMATOCRIT % 29.7* 30.9* 32.0*   PLATELET COUNT (1) 10*9/L 29* 45* 46*     Recent Labs   Lab Units 11/28/20  0526 11/28/20  0453 11/27/20  0636 11/26/20  1415 11/26/20  1337   SODIUM mmol/L  --  137 143  --  141   POTASSIUM mmol/L  --  3.2* 3.5  --  4.0   CHLORIDE mmol/L  --  105 111*  --  110*   CO2 mmol/L  --  21.8 21.6  --  24.2   BUN mg/dL  --  25* 28*  --  25*   CREATININE mg/dL  --  0.96* 0.45*  --  4.09*   GLUCOSE mg/dL  --  811* 914*  --  82   CALCIUM mg/dL  --  7.4* 8.0*  --  8.2*   ALBUMIN g/dL  --  1.7* 1.8*  --  1.9*   PROTEIN TOTAL g/dL  --  4.1* 4.3*  --  4.6*   BILIRUBIN TOTAL mg/dL  --  2.3* 2.9*  --  2.8*   AST U/L  --  37 37  --  48*   ALT U/L  --  27 25  --  27   ALK PHOS U/L  --  100 95  --  95   MAGNESIUM mg/dL  --   --   --   --  2.0   AMMONIA umol/L 52*  --  55* 147*  --      No results in the last week  Recent Labs   Lab Units 11/26/20  1447   INR  1.31   APTT sec 25.9     No results in the last weekNo results in the last week  Urine  Recent Labs   Lab Units 11/26/20  1347   WBC UA /HPF 10*   NITRITE UA  Positive*   LEUKOCYTES UA  Negative   BACTERIA UA /HPF Large*   BLOOD UA  Trace*   GLUCOSE UA  Negative   PROTEIN UA  Negative   KETONES UA  Negative      Body Fluids  No results in the last week  ABG  No results for input(s): O2SOUR, FIO2ART, PHART, PCO2ART, PO2ART, HCO3ART, O2SATART, BEART in the last 72 hours.  Microbiology Results (last day)     Procedure Component Value Date/Time Date/Time    Blood Culture #2 [[id number]] Collected: 11/26/20 1606    Lab Status: In process Specimen: Blood from 1 Peripheral Draw Updated: 11/26/20 1609    Blood Culture #1 [[id number]] Collected: 11/26/20 1559    Lab Status: In process Specimen: Blood from 1 Peripheral Draw Updated: 11/26/20 1609    Respiratory Pathogen Panel with COVID-19 [[id number]]  (Normal) Collected: 11/26/20 1347    Lab Status: Final result Specimen: Nasopharyngeal Swab Updated: 11/26/20 1549     Adenovirus Not Detected     Coronavirus PCR (229E, HKU1, NL63, OC43) Not Detected Metapneumovirus Not Detected     Rhinovirus/Enterovirus Not Detected     Influenza A Not Detected     Influenza A/H1 Not Detected     Influenza A/H3 Not Detected     Influenza A/H1-2009 Not Detected     Influenza B Not Detected     Parainfluenza 1 Not Detected     Parainfluenza 2 Not Detected  Parainfluenza 3 Not Detected     Parainfluenza 4 Not Detected     SARS-CoV-2 PCR Not Detected     RSV A PCR Not Detected     RSV B PCR Not Detected     Chlamydophila (Chlamydia) pneumoniae Not Detected     Mycoplasma pneumoniae Not Detected    Narrative:      Testing was performed using the [employer] Dx Respiratory Panel 2 (RP2). Performance characteristics have been verified by the [facility]. A negative result does not rule out the possibility of infection and should be used in conjunction with other clinical findings as well as patient history. Positive results do not rule out co-infection with organisms not included in the RP2 panel. This assay does not distinguish between rhinovirus and enterovirus.    This test has not been FDA cleared or approved, and has been authorized by FDA under an Emergency Use Authorization (EUA). This test is only authorized for the duration of time the declaration that circumstances exist justifying the authorization of the emergency use of in vitro diagnostic tests for detection of SARS-CoV-2 virus and/or diagnosis of COVID-19 infection under section 564(b)(1) of the Act, 21 U.S.C. 161WRU-0(A)(5), unless the authorization is terminated or revoked sooner.    For Providers:  [employer]  For Patients:  [email or web address]    Urine Culture [[id number]] Collected: 11/26/20 1347    Lab Status: In process Specimen: Urine from Catheterized-In and Out Catheter Updated: 11/26/20 1439         IMAGING:  No results found.     Elenora Fender, DO   11/28/2020

## 2020-11-29 LAB — BASIC METABOLIC PANEL
ANION GAP: 8 mmol/L (ref 3–11)
BLOOD UREA NITROGEN: 22 mg/dL — ABNORMAL HIGH (ref 8–20)
BUN / CREAT RATIO: 14
CALCIUM: 6.6 mg/dL — CL (ref 8.5–10.1)
CHLORIDE: 106 mmol/L (ref 98–107)
CO2: 23.3 mmol/L (ref 21.0–32.0)
CREATININE: 1.56 mg/dL — ABNORMAL HIGH (ref 0.60–1.10)
EGFR CKD-EPI AA FEMALE: 40 mL/min/{1.73_m2}
EGFR CKD-EPI NON-AA FEMALE: 35 mL/min/{1.73_m2}
GLUCOSE RANDOM: 230 mg/dL — ABNORMAL HIGH (ref 70–179)
POTASSIUM: 3.1 mmol/L — ABNORMAL LOW (ref 3.5–5.0)
SODIUM: 137 mmol/L (ref 135–145)

## 2020-11-29 LAB — AMMONIA: AMMONIA: 35 umol/L — ABNORMAL HIGH (ref 11–32)

## 2020-11-29 MED ADMIN — pantoprazole (PROTONIX) EC tablet 40 mg: 40 mg | ORAL | @ 13:00:00 | Stop: 2020-12-04

## 2020-11-29 MED ADMIN — busPIRone (BUSPAR) tablet 10 mg: 10 mg | ORAL | @ 10:00:00

## 2020-11-29 MED ADMIN — insulin lispro (HumaLOG) injection 0-15 Units: 0-15 [IU] | SUBCUTANEOUS | @ 13:00:00

## 2020-11-29 MED ADMIN — lactulose (CHRONULAC) oral solution (30 mL cup): 45 g | ORAL | @ 10:00:00

## 2020-11-29 MED ADMIN — insulin lispro (HumaLOG) injection 0-15 Units: 0-15 [IU] | SUBCUTANEOUS | @ 22:00:00

## 2020-11-29 MED ADMIN — rifAXIMin (XIFAXAN) tablet 550 mg: 550 mg | ORAL | @ 01:00:00 | Stop: 2020-12-16

## 2020-11-29 MED ADMIN — potassium chloride (KLOR-CON) packet 40 mEq: 40 meq | ORAL | @ 13:00:00 | Stop: 2020-11-29

## 2020-11-29 MED ADMIN — rifAXIMin (XIFAXAN) tablet 550 mg: 550 mg | ORAL | @ 13:00:00 | Stop: 2020-12-16

## 2020-11-29 MED ADMIN — cefTRIAXone in dextrose (premix) (ROCEPHIN) 1 gram/50 mL IVPB 1 g: 1 g | INTRAVENOUS | @ 22:00:00 | Stop: 2020-12-04

## 2020-11-29 MED ADMIN — insulin lispro (HumaLOG) injection 0-15 Units: 0-15 [IU] | SUBCUTANEOUS | @ 17:00:00

## 2020-11-29 MED ADMIN — dextrose 5 % and sodium chloride 0.45 % infusion: 50 mL/h | INTRAVENOUS | @ 10:00:00

## 2020-11-29 MED ADMIN — lactulose (CHRONULAC) oral solution (30 mL cup): 45 g | ORAL | @ 01:00:00

## 2020-11-29 MED ADMIN — spironolactone (ALDACTONE) tablet 100 mg: 100 mg | ORAL | @ 13:00:00 | Stop: 2020-12-04

## 2020-11-29 MED ADMIN — busPIRone (BUSPAR) tablet 10 mg: 10 mg | ORAL | @ 17:00:00

## 2020-11-29 MED ADMIN — bumetanide (BUMEX) tablet 2 mg: 2 mg | ORAL | @ 13:00:00 | Stop: 2020-12-02

## 2020-11-29 NOTE — Unmapped (Signed)
Daily Progress Note    Assessment/Plan:      Assessment:    Condition: In stable condition.  Unchanged.   (Principal Problem:    Hepatic encephalopathy (CMS-HCC)  Active Problems:    Cirrhosis (CMS-HCC)    GAVE (gastric antral vascular ectasia)    Type 2 diabetes mellitus without complication, with long-term current use of insulin (CMS-HCC)    Pulmonary hypertension (CMS-HCC)    Thrombocytopenia (CMS-HCC)    AKI (acute kidney injury) (CMS-HCC)  Resolved Problems:    * No resolved hospital problems. *   ).     Plan:   Encourage ambulation.  Consults: physical therapy and psychiatry.  Administer medications as ordered, resume home regimen and resume oral medications.   (Patient is admitted to the hospital with hepatic encephalopathy and change in mental status.  Patient has end-stage liver disease and cirrhosis with progressive worsening.  Patient is not a transplant candidate according to the hospital.  I had a long discussion with the patient on 11/29/2020.  Patient is tired at this point.  She wants to live with her sister.  She will monitor consider hospice.  I want hospice to talk to the patient and the husband while she is in the hospital to decide the further course of action.  At this time patient is not a transplant candidate and she does not want to wait.  She want to be comfortable and at home.  Her sister will be the best alternative option and if it is not possible then skilled nursing facility.  Hospice consult.  Case management to be involved.        Current Facility-Administered Medications:   ???  bumetanide (BUMEX) tablet 2 mg, 2 mg, Oral, Daily, Ryin Schillo B Bartholomew Ramesh, MD, 2 mg at 11/29/20 0850  ???  busPIRone (BUSPAR) tablet 10 mg, 10 mg, Oral, BID, Ignatius Specking, MD, 10 mg at 11/29/20 0539  ???  cefTRIAXone in dextrose (premix) (ROCEPHIN) 1 gram/50 mL IVPB 1 g, 1 g, Intravenous, Q24H, Jetty Peeks, FNP, Stopped at 11/28/20 1740  ???  dextrose (GLUTOSE) 40 % gel 15 g of dextrose, 15 g of dextrose, Oral, Q5 Min PRN, Jetty Peeks, FNP  ???  dextrose (GLUTOSE) 40 % gel 30 g of dextrose, 30 g of dextrose, Oral, Q5 Min PRN, Jetty Peeks, FNP  ???  dextrose 5 % and sodium chloride 0.45 % infusion, 50 mL/hr, Intravenous, Continuous, Jetty Peeks, [place], Last Rate: 50 mL/hr at 11/29/20 0547, 50 mL/hr at 11/29/20 0547  ???  dextrose 50 % in water (D50W) 50 % solution 12.5 g, 12.5 g, Intravenous, Q15 Min PRN, Jetty Peeks, FNP  ???  dextrose 50 % in water (D50W) 50 % solution 25 g, 25 g, Intravenous, Q15 Min PRN, Jetty Peeks, FNP  ???  glucagon injection 1 mg, 1 mg, Intramuscular, Once PRN, Jetty Peeks, FNP  ???  insulin lispro (HumaLOG) injection 0-15 Units, 0-15 Units, Subcutaneous, TID AC, Jetty Peeks, FNP, 5 Units at 11/29/20 0849  ???  lactulose (CHRONULAC) oral solution (30 mL cup), 45 g, Oral, QID, Ignatius Specking, MD, 45 g at 11/29/20 0539  ???  pantoprazole (PROTONIX) EC tablet 40 mg, 40 mg, Oral, Daily, Angee Gupton B Deangelo Berns, MD, 40 mg at 11/29/20 0850  ???  rifAXIMin (XIFAXAN) tablet 550 mg, 550 mg, Oral, BID, Natane Heward B Anisa Leanos, MD, 550 mg at 11/29/20 0850  ???  spironolactone (ALDACTONE) tablet 100 mg, 100 mg, Oral, Daily, Tollie Canada B  Nayeli Calvert, MD, 100 mg at 11/29/20 0850 ).             LOS: 3 days       Subjective:    Interval History: has complaints weakness.  Denies any chest pain or shortness of breath...     Objective:    Vital signs in last 24 hours:  Temp:  [36.3 ??C (97.4 ??F)-37.2 ??C (99 ??F)] 36.6 ??C (97.9 ??F)  Heart Rate:  [63-84] 63  Resp:  [18-22] 19  BP: (136-152)/(38-58) 144/46  MAP (mmHg):  [69] 69  SpO2:  [94 %-95 %] 94 %    Intake/Output last 3 shifts:  I/O last 3 completed shifts:  In: 720 [P.O.:720]  Out: -   Intake/Output this shift:  I/O this shift:  In: 240 [P.O.:240]  Out: -     REVIEW OF SYSTEM:  Review of Systems   HENT: Negative for ear pain and sore throat.    Cardiovascular: Negative for chest pain and leg swelling.   Gastrointestinal: Negative for constipation, diarrhea and heartburn.   Genitourinary: Negative for dysuria.   Skin: Negative for rash.   Neurological: Negative for headaches.        Physical Exam:  General appearance: alert, cooperative, and no distress  Eyes: conjunctivae/corneas clear. PERRL, EOM's intact. Fundi benign.  Throat: lips, mucosa, and tongue normal; teeth and gums normal  Neck: no adenopathy, no JVD, and supple, symmetrical, trachea midline  Lungs: clear to auscultation bilaterally  Chest wall: no tenderness  Abdomen: soft, non-tender; bowel sounds normal; no masses,  no organomegaly  Extremities: extremities normal, atraumatic, no cyanosis or edema    LABS:  Recent Results (from the past 24 hour(s))   POCT Glucose    Collection Time: 11/28/20 11:27 AM   Result Value Ref Range    Glucose, POC 277 (H) 70 - 105 mg/dL   POCT Glucose    Collection Time: 11/28/20  3:35 PM   Result Value Ref Range    Glucose, POC 296 (H) 70 - 105 mg/dL   POCT Glucose    Collection Time: 11/28/20  8:50 PM   Result Value Ref Range    Glucose, POC 310 (H) 70 - 105 mg/dL   Basic Metabolic Panel    Collection Time: 11/29/20  6:17 AM   Result Value Ref Range    Sodium 137 135 - 145 mmol/L    Potassium 3.1 (L) 3.5 - 5.0 mmol/L    Chloride 106 98 - 107 mmol/L    CO2 23.3 21.0 - 32.0 mmol/L    Anion Gap 8 3 - 11 mmol/L    BUN 22 (H) 8 - 20 mg/dL    Creatinine 0.98 (H) 0.60 - 1.10 mg/dL    BUN/Creatinine Ratio 14     EGFR CKD-EPI Non-African American, Female 35 mL/min/1.54m2    EGFR CKD-EPI African American, Female 40 mL/min/1.31m2    Glucose 230 (H) 70 - 179 mg/dL    Calcium 6.6 (LL) 8.5 - 10.1 mg/dL   POCT Glucose    Collection Time: 11/29/20  7:43 AM   Result Value Ref Range    Glucose, POC 206 (H) 70 - 105 mg/dL          Ignatius Specking, MD

## 2020-11-29 NOTE — Unmapped (Addendum)
Patient is admitted to the hospital with hepatic encephalopathy and change in mental status.  Patient has end-stage liver disease and cirrhosis with progressive worsening.  Patient is not a transplant candidate according to the hospital.  I had a long discussion with the patient on 11/29/2020.  Patient is tired at this point.  She wants to live with her sister.  She will monitor consider hospice.  I want hospice to talk to the patient and the husband while she is in the hospital to decide the further course of action.  At this time patient is not a transplant candidate and she does not want to wait.  She want to be comfortable and at home.  Her sister will be the best alternative option and if it is not possible then skilled nursing facility.  Hospice consult.  Case management to be involved.     Patient had anemia required 2 units of blood transfusion.  CBC was followed after that.  Long discussion with the patient and sister.  Also discussion with the case manager multiple times on 11/30/2020.  Care conference was obtained.  After that discussion patient will be going home with the hospice.  Hospital bed would be arranged.  Hospice will try to help the patient at home.  When clinical condition deteriorates patient can be going to the hospice home.  At this time end of the life issues were discussed.  Patient is not a liver transplant candidate progressive worsening over the last 8 years.  Discussion the patient and the all family members and case managers.  Patient will be discharged with home hospice.

## 2020-11-29 NOTE — Unmapped (Signed)
Problem: Mobility Impairment  Goal: Optimal Mobility  Outcome: Ongoing - Unchanged  Intervention: Optimize Mobility  Recent Flowsheet Documentation  Taken 11/28/2020 2100 by Ila Mcgill, RN  Positioning/Transfer Devices: pillows     Problem: Adult Inpatient Plan of Care  Goal: Plan of Care Review  Outcome: Ongoing - Unchanged  Flowsheets (Taken 11/28/2020 2348)  Progress: no change  Plan of Care Reviewed With: patient     Problem: Adult Inpatient Plan of Care  Goal: Absence of Hospital-Acquired Illness or Injury  Outcome: Ongoing - Unchanged  Intervention: Identify and Manage Fall Risk  Flowsheets (Taken 11/28/2020 2348)  Safety Interventions:   low bed   lighting adjusted for tasks/safety   fall reduction program maintained   nonskid shoes/slippers when out of bed   room near unit station  Intervention: Prevent and Manage VTE (Venous Thromboembolism) Risk  Recent Flowsheet Documentation  Taken 11/28/2020 2100 by Ila Mcgill, RN  VTE Prevention/Management: ambulation promoted

## 2020-11-29 NOTE — Unmapped (Signed)
PHYSICAL THERAPY        Patient Name:  Marilyn French       Medical Record Number: [id number]   Date of Birth: [birth year]  Sex: Female            Treatment Diagnosis: R26.2    Activity Tolerance: Tolerated treatment well    ASSESSMENT  Problem List: Impaired balance, Gait deviation, Decreased mobility, Fall Risk, Obesity, Decreased strength, Decreased endurance     Assessment : pt presents to hosp with hepatic encephalopathy. pt tolerated eval fair. fatigued quickly. failed to push up from bed or shift wt forward when standing. had difficulty staying on task. unsteady at times with amb. pt presents with physical impairments including decreased strength, balance and endurance and increased pain leading to decreased functional mob. req skilled PT to improve impairments and restore pt to PLOF. pt would benefit from cont PT upon DC such as HHPT          Personal Factors/Comorbidities Present: 1   Specific Comorbidities : med cond   Examination of Body System: 3-5 elements   Body System: muscular, skeletal, nervous, cardiovascular   Clinical Decision Making: Moderate     PLAN  Planned Frequency of Treatment:  1-2x per day for: 3-5x week Planned Treatment Duration: duration of hospitalization    Planned Interventions: Functional mobility, Therapeutic activity, Balance activities, Gait training, Education - Patient, Transfer training, Endurance activities    Post-Discharge Physical Therapy Recommendations:  3x weekly, Low intensity    PT DME Recommendations: None           Goals:   Patient and Family Goals: to go home            SHORT GOAL #1: pt will be able to perform all functional transfers I              Time Frame : 2 weeks  SHORT GOAL #2: pt will be able to amb ~200' with LRAD I              Time Frame : 2 weeks                                                          Prognosis:  Good  Positive Indicators: PLOF, support at home, current level of function  Barriers to Discharge: Functional strength deficits, Gait instability, Impaired Balance, Obesity    SUBJECTIVE     Patient reports: agreed to participate in PT eval  Current Functional Status: pt req SBA with mob     Prior Functional Status: stated that she uses quad cane for amb, I with ADL's req assist IADL's  Equipment available at home: Quad cane, Standard Walker, Bedside commode, [employer]     Past Medical History:   Diagnosis Date   ??? Cirrhosis (CMS-HCC)    ??? Depression    ??? Diabetes mellitus (CMS-HCC)    ??? Fibromyalgia    ??? GAVE (gastric antral vascular ectasia)    ??? History of transfusion 04/2019    reports frequent blood tranfusions   ??? HTN (hypertension)    ??? Hypercholesterolemia    ??? Hypothyroid    ??? NAFLD (nonalcoholic fatty liver disease)    ??? Osteoporosis     Social History     Tobacco Use   ??? Smoking status:  Never Smoker   ??? Smokeless tobacco: Never Used   Substance Use Topics   ??? Alcohol use: No      Past Surgical History:   Procedure Laterality Date   ??? APPENDECTOMY     ??? CHOLECYSTECTOMY     ??? IR TIPS  07/08/2018    IR TIPS 07/08/2018 Soledad Gerlach, MD IMG VIR H&V [facility]   ??? PR COLSC FLEXIBLE W/CONTROL BLEEDING ANY METHOD N/A 10/17/2019    Procedure: COLONOSCOPY, FLEXIBLE, PROXIMAL TO SPLENIC FLEXURE; DX, W/CONTROL OF BLEEDING;  Surgeon: Jules Husbands, MD;  [facility];  Service: Gastroenterology   ??? PR RIGHT HEART CATH O2 SATURATION & CARDIAC OUTPUT Right 01/18/2018    Procedure: Right Heart Catheterization;  Surgeon: Marlaine Hind, MD;  Location: [facility] CATH;  Service: Cardiology   ??? PR UPPER GI ENDOSCOPY,CTRL BLEED N/A 03/05/2019    Procedure: UGI ENDOSCOPY; WITH CONTROL OF BLEEDING, ANY METHOD;  Surgeon: Andrey Farmer, MD;  [facility]: [facility];  Service: Gastroenterology   ??? PR UPPER GI ENDOSCOPY,CTRL BLEED  04/04/2019    Procedure: UGI ENDOSCOPY; WITH CONTROL OF BLEEDING, ANY METHOD;  Surgeon: Janyth Pupa, MD;  [facility];  Service: Gastroenterology ??? PR UPPER GI ENDOSCOPY,CTRL BLEED N/A 05/23/2019    Procedure: UGI ENDOSCOPY; WITH CONTROL OF BLEEDING, ANY METHOD;  Surgeon: Janyth Pupa, MD;  Location: [facility];  Service: Gastroenterology   ??? PR UPPER GI ENDOSCOPY,CTRL BLEED N/A 06/13/2019    Procedure: UGI ENDOSCOPY; WITH CONTROL OF BLEEDING, ANY METHOD;  Surgeon: Annie Paras, MD;  Location: [facility];  Service: Gastroenterology   ??? PR UPPER GI ENDOSCOPY,CTRL BLEED N/A 07/11/2019    Procedure: UGI ENDOSCOPY; WITH CONTROL OF BLEEDING, ANY METHOD;  Surgeon: Annie Paras, MD;  [facility]: [facility];  Service: Gastroenterology   ??? PR UPPER GI ENDOSCOPY,CTRL BLEED N/A 09/16/2019    Procedure: UGI ENDOSCOPY; WITH CONTROL OF BLEEDING, ANY METHOD;  Surgeon: Beverly Milch, MD;  [facility];  Service: Gastrointestinal   ??? PR UPPER GI ENDOSCOPY,CTRL BLEED N/A 10/07/2019    Procedure: UGI ENDOSCOPY; WITH CONTROL OF BLEEDING, ANY METHOD;  Surgeon: Pia Mau, MD;  Location: [facility];  Service: Gastroenterology   ??? PR UPPER GI ENDOSCOPY,CTRL BLEED N/A 01/02/2020    Procedure: UGI ENDOSCOPY; WITH CONTROL OF BLEEDING, ANY METHOD;  Surgeon: Janyth Pupa, MD;  [facility]: [facility];  Service: Gastroenterology   ??? PR UPPER GI ENDOSCOPY,CTRL BLEED N/A 04/09/2020    Procedure: UGI ENDOSCOPY; WITH CONTROL OF BLEEDING, ANY METHOD;  Surgeon: Janyth Pupa, MD;  Location: [facility];  Service: Gastroenterology   ??? PR UPPER GI ENDOSCOPY,DIAGNOSIS N/A 03/03/2019    Procedure: UGI ENDO, INCLUDE ESOPHAGUS, STOMACH, & DUODENUM &/OR JEJUNUM; DX W/WO COLLECTION SPECIMN, BY BRUSH OR WASH;  Surgeon: Luanne Bras, MD;  Location: [facility];  Service: Gastroenterology   ??? PR UPPER GI ENDOSCOPY,LIGAT VARIX N/A 01/02/2020    Procedure: UGI ENDO; Everlene Balls LIG ESOPH &/OR GASTRIC VARICES;  Surgeon: Janyth Pupa, MD;  [facility]: [facility];  Service: Gastroenterology   ??? PR UPPER GI ENDOSCOPY,LIGAT VARIX N/A 04/09/2020    Procedure: UGI ENDO; W/BAND LIG ESOPH &/OR GASTRIC VARICES;  Surgeon: Janyth Pupa, MD;  [facility]: [facility];  Service: Gastroenterology   ??? PR UPPER GI ENDOSCOPY,LIGAT VARIX N/A 06/18/2020  Procedure: UGI ENDO; W/BAND LIG ESOPH &/OR GASTRIC VARICES;  Surgeon: Janyth Pupa, MD;  [facility]: [facility];  Service: Gastroenterology   ??? SALPINGOOPHORECTOMY      History reviewed. No pertinent family history.     Allergies: Ferumoxytol, Definity [perflutren lipid microspheres], Other, Statins-hmg-coa reductase inhibitors, and Tramadol hcl                Objective Findings  Precautions / Restrictions  Precautions: Falls precautions  Weight Bearing Status: Non-applicable  Required Braces or Orthoses: Non-applicable    Communication Preference: Verbal   Pain Comments: none  Medical Tests / Procedures: PT eval  Equipment / Environment: Vascular access (PIV, TLC, Port-a-cath, PICC)                  Living Situation  Living Environment: House  Lives With: Spouse  Home Living: Two level home, Able to Live on main level with bedroom/bathroom, Built-in shower seat, Grab bars in shower, Raised toilet seat with rails, Stairs to enter with rails, Walk-in shower, Bedside commode  Rail placement (outside): Bilateral rails  Number of Stairs to Enter (outside): 2     Cognition  Cognition: WFL       UE ROM / Strength  UE ROM/Strength: Left WFL, Right WFL  LE ROM / Strength  LE ROM/Strength: Left Impaired/Limited, Right Impaired/Limited    Motor/ Sensory/ Neuro  Coordination: WFL  Proprioception: WFL  Sensation: WFL  Balance: Impaired, Impaired dynamic standing balance, Impaired static standing balance  Balance comment: standing F+  Posture: WFL          Transfers  Transfers: Supine to Sit, Sit to Stand  Supine to Sit assistance level: Independent  Sit to Stand assistance level: No physical assistance, Verbal Cues  Sit to Stand comments: cues for hand placement and to shift wt forward     Gait  Level of Assistance: Standby assist, set-up cues, supervision of patient - no hands on  Assistive Device: Front wheel walker  Distance Ambulated (ft): 150 ft  Gait: decreased step length and gait speed    Stairs: NT     Wheelchair Mobility: NT    Endurance: fair    Physical Therapy Session Duration  PT Individual [mins]: 25    Medical Staff Made Aware: RN    I attest that I have reviewed the above information.  Signed: Jenel Lucks, PT  Filed 11/29/2020

## 2020-11-30 LAB — COMPREHENSIVE METABOLIC PANEL
ALBUMIN: 1.5 g/dL — ABNORMAL LOW (ref 3.5–5.0)
ALKALINE PHOSPHATASE: 95 U/L (ref 46–116)
ALT (SGPT): 20 U/L (ref 12–78)
ANION GAP: 6 mmol/L (ref 3–11)
AST (SGOT): 32 U/L (ref 15–40)
BILIRUBIN TOTAL: 1.5 mg/dL — ABNORMAL HIGH (ref 0.3–1.2)
BLOOD UREA NITROGEN: 26 mg/dL — ABNORMAL HIGH (ref 8–20)
BUN / CREAT RATIO: 16
CALCIUM: 7 mg/dL — ABNORMAL LOW (ref 8.5–10.1)
CHLORIDE: 106 mmol/L (ref 98–107)
CO2: 23.1 mmol/L (ref 21.0–32.0)
CREATININE: 1.64 mg/dL — ABNORMAL HIGH (ref 0.60–1.10)
EGFR CKD-EPI AA FEMALE: 38 mL/min/{1.73_m2}
EGFR CKD-EPI NON-AA FEMALE: 33 mL/min/{1.73_m2}
GLUCOSE RANDOM: 262 mg/dL — ABNORMAL HIGH (ref 70–179)
POTASSIUM: 3.6 mmol/L (ref 3.5–5.0)
PROTEIN TOTAL: 3.7 g/dL — ABNORMAL LOW (ref 6.0–8.0)
SODIUM: 135 mmol/L (ref 135–145)

## 2020-11-30 LAB — CBC W/ AUTO DIFF
BASOPHILS ABSOLUTE COUNT: 0 10*9/L (ref 0.0–0.2)
BASOPHILS RELATIVE PERCENT: 0.7 %
EOSINOPHILS ABSOLUTE COUNT: 0.1 10*9/L (ref 0.0–0.4)
EOSINOPHILS RELATIVE PERCENT: 3 %
HEMATOCRIT: 25.3 % — ABNORMAL LOW (ref 34.0–44.0)
HEMOGLOBIN: 8.1 g/dL — ABNORMAL LOW (ref 11.5–15.0)
LYMPHOCYTES ABSOLUTE COUNT: 0.5 10*9/L — ABNORMAL LOW (ref 0.7–4.5)
LYMPHOCYTES RELATIVE PERCENT: 19.3 %
MEAN CORPUSCULAR HEMOGLOBIN CONC: 32 g/dL (ref 32.0–36.0)
MEAN CORPUSCULAR HEMOGLOBIN: 32 pg (ref 27.0–34.0)
MEAN CORPUSCULAR VOLUME: 100 fL — ABNORMAL HIGH (ref 80.0–98.0)
MEAN PLATELET VOLUME: 12.8 fL — ABNORMAL HIGH (ref 7.4–10.4)
MONOCYTES ABSOLUTE COUNT: 0.3 10*9/L (ref 0.1–1.0)
MONOCYTES RELATIVE PERCENT: 11.5 %
NEUTROPHILS ABSOLUTE COUNT: 1.8 10*9/L (ref 1.8–7.8)
NEUTROPHILS RELATIVE PERCENT: 65.1 %
PLATELET COUNT: 40 10*9/L — ABNORMAL LOW (ref 140–415)
RED BLOOD CELL COUNT: 2.53 10*12/L — ABNORMAL LOW (ref 3.80–5.10)
RED CELL DISTRIBUTION WIDTH: 20 % — ABNORMAL HIGH (ref 11.5–14.5)
WBC ADJUSTED: 2.7 10*9/L — ABNORMAL LOW (ref 4.0–10.5)

## 2020-11-30 LAB — AMMONIA: AMMONIA: 46 umol/L — ABNORMAL HIGH (ref 11–32)

## 2020-11-30 MED ADMIN — insulin lispro (HumaLOG) injection 0-15 Units: 0-15 [IU] | SUBCUTANEOUS | @ 16:00:00

## 2020-11-30 MED ADMIN — busPIRone (BUSPAR) tablet 10 mg: 10 mg | ORAL | @ 10:00:00

## 2020-11-30 MED ADMIN — rifAXIMin (XIFAXAN) tablet 550 mg: 550 mg | ORAL | @ 13:00:00 | Stop: 2020-12-16

## 2020-11-30 MED ADMIN — lactulose (CHRONULAC) oral solution (30 mL cup): 45 g | ORAL | @ 16:00:00

## 2020-11-30 MED ADMIN — lactulose (CHRONULAC) oral solution (30 mL cup): 45 g | ORAL | @ 10:00:00

## 2020-11-30 MED ADMIN — cefTRIAXone in dextrose (premix) (ROCEPHIN) 1 gram/50 mL IVPB 1 g: 1 g | INTRAVENOUS | @ 21:00:00 | Stop: 2020-12-04

## 2020-11-30 MED ADMIN — sodium chloride (NS) 0.9 % infusion: INTRAVENOUS | @ 17:00:00

## 2020-11-30 MED ADMIN — insulin lispro (HumaLOG) injection 0-15 Units: 0-15 [IU] | SUBCUTANEOUS | @ 21:00:00

## 2020-11-30 MED ADMIN — dextrose 5 % and sodium chloride 0.45 % infusion: 50 mL/h | INTRAVENOUS | @ 06:00:00 | Stop: 2020-11-30

## 2020-11-30 MED ADMIN — lactulose (CHRONULAC) oral solution (30 mL cup): 45 g | ORAL | @ 21:00:00

## 2020-11-30 MED ADMIN — insulin lispro (HumaLOG) injection 0-15 Units: 0-15 [IU] | SUBCUTANEOUS | @ 13:00:00

## 2020-11-30 MED ADMIN — pantoprazole (PROTONIX) EC tablet 40 mg: 40 mg | ORAL | @ 13:00:00 | Stop: 2020-12-04

## 2020-11-30 MED ADMIN — bumetanide (BUMEX) tablet 2 mg: 2 mg | ORAL | @ 13:00:00 | Stop: 2020-12-02

## 2020-11-30 MED ADMIN — furosemide (LASIX) injection 20 mg: 20 mg | INTRAVENOUS | @ 21:00:00 | Stop: 2020-11-30

## 2020-11-30 MED ADMIN — spironolactone (ALDACTONE) tablet 100 mg: 100 mg | ORAL | @ 13:00:00 | Stop: 2020-12-04

## 2020-11-30 MED ADMIN — rifAXIMin (XIFAXAN) tablet 550 mg: 550 mg | ORAL | Stop: 2020-12-16

## 2020-11-30 MED ADMIN — busPIRone (BUSPAR) tablet 10 mg: 10 mg | ORAL | @ 18:00:00

## 2020-11-30 NOTE — Unmapped (Signed)
Problem: Anemia  Goal: Anemia Symptom Improvement  Outcome: Not Progressing--hgb-8.1, orders for 2 units PRBCs  Intervention: Monitor and Manage Anemia  Recent Flowsheet Documentation  Taken 11/30/2020 1000 by Lajean Manes, RN  Safety Interventions:   low bed   bed alarm  Taken 11/30/2020 0800 by Lajean Manes, RN  Safety Interventions:   bed alarm   low bed     Problem: Adult Inpatient Plan of Care  Goal: Readiness for Transition of Care  Outcome: Ongoing - Unchanged  Goal: Rounds/Family Conference  Outcome: Ongoing - Unchanged     Problem: Diabetes Comorbidity  Goal: Blood Glucose Level Within Targeted Range  Outcome: Progressing     Problem: Mobility Impairment  Goal: Optimal Mobility  Outcome: Progressing     Problem: Adult Inpatient Plan of Care  Goal: Plan of Care Review  Outcome: Progressing  Goal: Patient-Specific Goal (Individualized)  Outcome: Progressing  Goal: Absence of Hospital-Acquired Illness or Injury  Outcome: Progressing  Intervention: Identify and Manage Fall Risk  Recent Flowsheet Documentation  Taken 11/30/2020 1000 by Lajean Manes, RN  Safety Interventions:   low bed   bed alarm  Taken 11/30/2020 0800 by Lajean Manes, RN  Safety Interventions:   bed alarm   low bed  Intervention: Prevent and Manage VTE (Venous Thromboembolism) Risk  Recent Flowsheet Documentation  Taken 11/30/2020 1100 by Lajean Manes, RN  VTE Prevention/Management:   ambulation promoted   anticoagulant therapy  Goal: Optimal Comfort and Wellbeing  Outcome: Progressing     Problem: Fluid and Electrolyte Imbalance (Acute Kidney Injury/Impairment)  Goal: Fluid and Electrolyte Balance  Outcome: Progressing     Problem: Oral Intake Inadequate (Acute Kidney Injury/Impairment)  Goal: Optimal Nutrition Intake  Outcome: Progressing     Problem: Renal Function Impairment (Acute Kidney Injury/Impairment)  Goal: Effective Renal Function  Outcome: Progressing     Problem: Neurologic Function Impaired (Liver Failure)  Goal: Optimal Neurologic Function  Outcome: Progressing     Problem: Oral Intake Inadequate (Liver Failure)  Goal: Improved Oral Intake  Outcome: Progressing     Problem: Fall Injury Risk  Goal: Absence of Fall and Fall-Related Injury  Outcome: Progressing  Intervention: Promote Injury-Free Environment  Recent Flowsheet Documentation  Taken 11/30/2020 1000 by Lajean Manes, RN  Safety Interventions:   low bed   bed alarm  Taken 11/30/2020 0800 by Lajean Manes, RN  Safety Interventions:   bed alarm   low bed     Problem: Skin Injury Risk Increased  Goal: Skin Health and Integrity  Outcome: Progressing  Intervention: Optimize Skin Protection  Recent Flowsheet Documentation  Taken 11/30/2020 1100 by Lajean Manes, RN  Pressure Reduction Techniques: frequent weight shift encouraged  Pressure Reduction Devices: pressure-redistributing mattress utilized  Taken 11/30/2020 1000 by Lajean Manes, RN  Pressure Reduction Techniques: frequent weight shift encouraged  Pressure Reduction Devices: pressure-redistributing mattress utilized  Taken 11/30/2020 0800 by Lajean Manes, RN  Pressure Reduction Techniques: (turns self)   frequent weight shift encouraged   other (see comments)  Pressure Reduction Devices: pressure-redistributing mattress utilized     Problem: Self-Care Deficit  Goal: Improved Ability to Complete Activities of Daily Living  Outcome: Progressing

## 2020-12-01 LAB — CBC W/ AUTO DIFF
BASOPHILS ABSOLUTE COUNT: 0 10*9/L (ref 0.0–0.2)
BASOPHILS RELATIVE PERCENT: 1.1 %
EOSINOPHILS ABSOLUTE COUNT: 0.1 10*9/L (ref 0.0–0.4)
EOSINOPHILS RELATIVE PERCENT: 3.9 %
HEMATOCRIT: 30.7 % — ABNORMAL LOW (ref 34.0–44.0)
HEMOGLOBIN: 10 g/dL — ABNORMAL LOW (ref 11.5–15.0)
LYMPHOCYTES ABSOLUTE COUNT: 0.7 10*9/L (ref 0.7–4.5)
LYMPHOCYTES RELATIVE PERCENT: 24.2 %
MEAN CORPUSCULAR HEMOGLOBIN CONC: 32.6 g/dL (ref 32.0–36.0)
MEAN CORPUSCULAR HEMOGLOBIN: 30.9 pg (ref 27.0–34.0)
MEAN CORPUSCULAR VOLUME: 94.8 fL (ref 80.0–98.0)
MEAN PLATELET VOLUME: 13.5 fL — ABNORMAL HIGH (ref 7.4–10.4)
MONOCYTES ABSOLUTE COUNT: 0.4 10*9/L (ref 0.1–1.0)
MONOCYTES RELATIVE PERCENT: 13.5 %
NEUTROPHILS ABSOLUTE COUNT: 1.6 10*9/L — ABNORMAL LOW (ref 1.8–7.8)
NEUTROPHILS RELATIVE PERCENT: 56.9 %
PLATELET COUNT: 34 10*9/L — CL (ref 140–415)
RED BLOOD CELL COUNT: 3.24 10*12/L — ABNORMAL LOW (ref 3.80–5.10)
RED CELL DISTRIBUTION WIDTH: 22.4 % — ABNORMAL HIGH (ref 11.5–14.5)
WBC ADJUSTED: 2.8 10*9/L — ABNORMAL LOW (ref 4.0–10.5)

## 2020-12-01 LAB — AMMONIA: AMMONIA: 43 umol/L — ABNORMAL HIGH (ref 11–32)

## 2020-12-01 MED ADMIN — insulin glargine (LANTUS) injection 13 Units: .15 [IU]/kg/d | SUBCUTANEOUS | @ 01:00:00

## 2020-12-01 MED ADMIN — rifAXIMin (XIFAXAN) tablet 550 mg: 550 mg | ORAL | @ 01:00:00 | Stop: 2020-12-16

## 2020-12-01 MED ADMIN — insulin lispro (HumaLOG) injection 0-15 Units: 0-15 [IU] | SUBCUTANEOUS | @ 16:00:00 | Stop: 2020-12-01

## 2020-12-01 MED ADMIN — bumetanide (BUMEX) tablet 2 mg: 2 mg | ORAL | @ 14:00:00 | Stop: 2020-12-01

## 2020-12-01 MED ADMIN — pantoprazole (PROTONIX) EC tablet 40 mg: 40 mg | ORAL | @ 14:00:00 | Stop: 2020-12-01

## 2020-12-01 MED ADMIN — rifAXIMin (XIFAXAN) tablet 550 mg: 550 mg | ORAL | @ 14:00:00 | Stop: 2020-12-01

## 2020-12-01 MED ADMIN — lactulose (CHRONULAC) oral solution (30 mL cup): 45 g | ORAL | @ 10:00:00 | Stop: 2020-12-01

## 2020-12-01 MED ADMIN — insulin lispro (HumaLOG) injection 0-15 Units: 0-15 [IU] | SUBCUTANEOUS | @ 14:00:00 | Stop: 2020-12-01

## 2020-12-01 MED ADMIN — spironolactone (ALDACTONE) tablet 100 mg: 100 mg | ORAL | @ 15:00:00 | Stop: 2020-12-01

## 2020-12-01 MED ADMIN — busPIRone (BUSPAR) tablet 10 mg: 10 mg | ORAL | @ 17:00:00 | Stop: 2020-12-01

## 2020-12-01 MED ADMIN — lactulose (CHRONULAC) oral solution (30 mL cup): 45 g | ORAL | @ 01:00:00

## 2020-12-01 MED ADMIN — busPIRone (BUSPAR) tablet 10 mg: 10 mg | ORAL | @ 10:00:00 | Stop: 2020-12-01

## 2020-12-01 NOTE — Unmapped (Signed)
Problem: Diabetes Comorbidity  Goal: Blood Glucose Level Within Targeted Range  Outcome: Progressing     Problem: Mobility Impairment  Goal: Optimal Mobility  Outcome: Progressing

## 2020-12-01 NOTE — Unmapped (Signed)
Problem: Diabetes Comorbidity  Goal: Blood Glucose Level Within Targeted Range  Outcome: Progressing     Problem: Mobility Impairment  Goal: Optimal Mobility  Outcome: Progressing     Problem: Adult Inpatient Plan of Care  Goal: Plan of Care Review  Outcome: Progressing  Goal: Patient-Specific Goal (Individualized)  Outcome: Progressing  Goal: Absence of Hospital-Acquired Illness or Injury  Outcome: Progressing  Intervention: Identify and Manage Fall Risk  Recent Flowsheet Documentation  Taken 11/30/2020 2010 by Mable Fill, RN  Safety Interventions:   bed alarm   low bed   nonskid shoes/slippers when out of bed   room near unit station  Intervention: Prevent and Manage VTE (Venous Thromboembolism) Risk  Recent Flowsheet Documentation  Taken 11/30/2020 1915 by Mable Fill, RN  VTE Prevention/Management:   ambulation promoted   fluids promoted   anticoagulant therapy  Intervention: Prevent Infection  Recent Flowsheet Documentation  Taken 11/30/2020 2010 by Mable Fill, RN  Infection Prevention:   environmental surveillance performed   hand hygiene promoted   rest/sleep promoted  Goal: Optimal Comfort and Wellbeing  Outcome: Progressing  Goal: Readiness for Transition of Care  Outcome: Progressing  Goal: Rounds/Family Conference  Outcome: Progressing     Problem: Fluid and Electrolyte Imbalance (Acute Kidney Injury/Impairment)  Goal: Fluid and Electrolyte Balance  Outcome: Progressing     Problem: Oral Intake Inadequate (Acute Kidney Injury/Impairment)  Goal: Optimal Nutrition Intake  Outcome: Progressing     Problem: Anemia  Goal: Anemia Symptom Improvement  12/01/2020 0714 by Mable Fill, RN  Outcome: Progressing  12/01/2020 0639 by Mable Fill, RN  Outcome: Progressing  Intervention: Monitor and Manage Anemia  Recent Flowsheet Documentation  Taken 11/30/2020 2010 by Mable Fill, RN  Safety Interventions:   bed alarm   low bed   nonskid shoes/slippers when out of bed   room near unit station  Taken 11/30/2020 1915 by Mable Fill, RN  Fatigue Management: activity assistance provided

## 2020-12-01 NOTE — Unmapped (Signed)
Problem: Diabetes Comorbidity  Goal: Blood Glucose Level Within Targeted Range  12/01/2020 1324 by Rosezena Sensor, RN  Outcome: Resolved  12/01/2020 1324 by Rosezena Sensor, RN  Outcome: Progressing     Problem: Mobility Impairment  Goal: Optimal Mobility  12/01/2020 1324 by Rosezena Sensor, RN  Outcome: Resolved  12/01/2020 1324 by Rosezena Sensor, RN  Outcome: Progressing     Problem: Adult Inpatient Plan of Care  Goal: Plan of Care Review  Outcome: Resolved  Goal: Patient-Specific Goal (Individualized)  Outcome: Resolved  Goal: Absence of Hospital-Acquired Illness or Injury  Outcome: Resolved  Goal: Optimal Comfort and Wellbeing  Outcome: Resolved  Goal: Readiness for Transition of Care  Outcome: Resolved  Goal: Rounds/Family Conference  Outcome: Resolved     Problem: Fluid and Electrolyte Imbalance (Acute Kidney Injury/Impairment)  Goal: Fluid and Electrolyte Balance  Outcome: Resolved     Problem: Oral Intake Inadequate (Acute Kidney Injury/Impairment)  Goal: Optimal Nutrition Intake  Outcome: Resolved     Problem: Renal Function Impairment (Acute Kidney Injury/Impairment)  Goal: Effective Renal Function  Outcome: Resolved     Problem: Anemia  Goal: Anemia Symptom Improvement  Outcome: Resolved     Problem: Neurologic Function Impaired (Liver Failure)  Goal: Optimal Neurologic Function  Outcome: Resolved     Problem: Oral Intake Inadequate (Liver Failure)  Goal: Improved Oral Intake  Outcome: Resolved     Problem: Fall Injury Risk  Goal: Absence of Fall and Fall-Related Injury  Outcome: Resolved     Problem: Skin Injury Risk Increased  Goal: Skin Health and Integrity  Outcome: Resolved  Intervention: Optimize Skin Protection  Recent Flowsheet Documentation  Taken 12/01/2020 1000 by Rosezena Sensor, RN  Pressure Reduction Techniques: frequent weight shift encouraged  Pressure Reduction Devices: pressure-redistributing mattress utilized     Problem: Self-Care Deficit  Goal: Improved Ability to Complete Activities of Daily Living  Outcome: Resolved

## 2020-12-01 NOTE — Unmapped (Signed)
Daily Progress Note    Assessment/Plan:      Assessment:    Condition: In stable condition.  Unchanged.   (Principal Problem:    Hepatic encephalopathy (CMS-HCC)  Active Problems:    Cirrhosis (CMS-HCC)    GAVE (gastric antral vascular ectasia)    Type 2 diabetes mellitus without complication, with long-term current use of insulin (CMS-HCC)    Pulmonary hypertension (CMS-HCC)    Thrombocytopenia (CMS-HCC)    AKI (acute kidney injury) (CMS-HCC)  Resolved Problems:    * No resolved hospital problems. *   ).     Plan:   Encourage ambulation.  Administer medications as ordered, resume home regimen and resume oral medications.   (Continue current medication  Type and cross and transfuse 2 units of blood.  Lasix 20 mg in between.  IV fluid with dextrose discontinued.  Continue the medication.  Rocephin empirically continued.  Lantus was started.  Discussed with the patient earlier.  Discussed with the case manager.  Discussed with the family conference call with husband case manager hospice.  Discussed with the case manager again later on.  Finally patient will be going to the home with hospice.  Hospice will try to help.  Patient husband is on board at this time.  Case worker has talked to the patient daughter also.  I have talked with the patient's sister earlier also.  She is terminally sick and at this time she does not want to proceed with aggressive interventions  She will be going home with hospice with possibility that if any worsening happens patient will be approaching hospice and possibly hospice home.        Current Facility-Administered Medications:   ???  acetaminophen (TYLENOL) tablet 650 mg, 650 mg, Oral, Once PRN, Reese Senk B Ailea Rhatigan, MD  ???  bumetanide (BUMEX) tablet 2 mg, 2 mg, Oral, Daily, Nakhi Choi B Adelheid Hoggard, MD, 2 mg at 11/30/20 0904  ???  busPIRone (BUSPAR) tablet 10 mg, 10 mg, Oral, BID, Ignatius Specking, MD, 10 mg at 11/30/20 1339  ???  cefTRIAXone in dextrose (premix) (ROCEPHIN) 1 gram/50 mL IVPB 1 g, 1 g, Intravenous, Q24H, Jetty Peeks, FNP, Stopped at 11/30/20 1804  ???  dextrose (GLUTOSE) 40 % gel 15 g of dextrose, 15 g of dextrose, Oral, Q5 Min PRN, Jetty Peeks, FNP  ???  dextrose (GLUTOSE) 40 % gel 30 g of dextrose, 30 g of dextrose, Oral, Q5 Min PRN, Jetty Peeks, FNP  ???  dextrose 50 % in water (D50W) 50 % solution 12.5 g, 12.5 g, Intravenous, Q15 Min PRN, Jetty Peeks, FNP  ???  dextrose 50 % in water (D50W) 50 % solution 25 g, 25 g, Intravenous, Q15 Min PRN, Jetty Peeks, FNP  ???  glucagon injection 1 mg, 1 mg, Intramuscular, Once PRN, Jetty Peeks, FNP  ???  insulin glargine (LANTUS) injection 13 Units, 0.15 Units/kg/day, Subcutaneous, Nightly, Ignatius Specking, MD  ???  insulin lispro (HumaLOG) injection 0-15 Units, 0-15 Units, Subcutaneous, TID AC, Jetty Peeks, [place], 8 Units at 11/30/20 1728  ???  lactulose (CHRONULAC) oral solution (30 mL cup), 45 g, Oral, QID, Wlliam Grosso B Lawsyn Heiler, MD, 45 g at 11/30/20 1720  ???  pantoprazole (PROTONIX) EC tablet 40 mg, 40 mg, Oral, Daily, Majestic Brister B Stashia Sia, MD, 40 mg at 11/30/20 0904  ???  rifAXIMin (XIFAXAN) tablet 550 mg, 550 mg, Oral, BID, Ignatius Specking, MD, 550 mg at 11/30/20 0904  ???  sodium chloride (NS) 0.9 %  infusion, , Intravenous, Continuous, Annalena Piatt B Loden Laurent, MD, Last Rate: 10 mL/hr at 11/30/20 1317, 10 mL at 11/30/20 1317  ???  spironolactone (ALDACTONE) tablet 100 mg, 100 mg, Oral, Daily, Adreena Willits B Jeevan Kalla, MD, 100 mg at 11/30/20 0904 ).             LOS: 4 days       Subjective:    Interval History: has complaints patient has fatigue and weakness.  Denies any chest pain or palpitation  Patient is more alert.  Had diarrhea secondary to lactulose yesterday.  Ammonia level is normal this morning she is coherent..     Objective:    Vital signs in last 24 hours:  Temp:  [36.3 ??C (97.3 ??F)-36.9 ??C (98.5 ??F)] 36.9 ??C (98.5 ??F)  Heart Rate:  [71-81] 71  SpO2 Pulse:  [94] 94  Resp:  [17-20] 17  BP: (126-140)/(43-52) 136/49  MAP (mmHg):  [68-88] 74  SpO2:  [92 %-98 %] 92 %    Intake/Output last 3 shifts:  I/O last 3 completed shifts:  In: 640 [P.O.:640]  Out: -   Intake/Output this shift:  No intake/output data recorded.    REVIEW OF SYSTEM:  Review of Systems   Constitutional: Positive for malaise/fatigue.   HENT: Negative for ear pain and sore throat.    Cardiovascular: Negative for chest pain and leg swelling.   Gastrointestinal: Negative for constipation, diarrhea and heartburn.   Genitourinary: Negative for dysuria.   Skin: Negative for rash.   Neurological: Negative for headaches.        Physical Exam:  General appearance: alert, cooperative, and no distress  Eyes: conjunctivae/corneas clear. PERRL, EOM's intact. Fundi benign.  Throat: lips, mucosa, and tongue normal; teeth and gums normal  Neck: no adenopathy, no JVD, and supple, symmetrical, trachea midline  Lungs: clear to auscultation bilaterally  Chest wall: no tenderness  Abdomen: soft, non-tender; bowel sounds normal; no masses,  no organomegaly  Extremities: extremities normal, atraumatic, no cyanosis or edema    LABS:  Recent Results (from the past 24 hour(s))   Comprehensive Metabolic Panel    Collection Time: 11/30/20  4:14 AM   Result Value Ref Range    Sodium 135 135 - 145 mmol/L    Potassium 3.6 3.5 - 5.0 mmol/L    Chloride 106 98 - 107 mmol/L    Anion Gap 6 3 - 11 mmol/L    CO2 23.1 21.0 - 32.0 mmol/L    BUN 26 (H) 8 - 20 mg/dL    Creatinine 4.54 (H) 0.60 - 1.10 mg/dL    BUN/Creatinine Ratio 16     EGFR CKD-EPI Non-African American, Female 33 mL/min/1.33m2    EGFR CKD-EPI African American, Female 38 mL/min/1.19m2    Glucose 262 (H) 70 - 179 mg/dL    Calcium 7.0 (L) 8.5 - 10.1 mg/dL    Albumin 1.5 (L) 3.5 - 5.0 g/dL    Total Protein 3.7 (L) 6.0 - 8.0 g/dL    Total Bilirubin 1.5 (H) 0.3 - 1.2 mg/dL    AST 32 15 - 40 U/L    ALT 20 12 - 78 U/L    Alkaline Phosphatase 95 46 - 116 U/L   Ammonia    Collection Time: 11/30/20  4:14 AM   Result Value Ref Range    Ammonia 46 (H) 11 - 32 umol/L   CBC w/ Differential Collection Time: 11/30/20  4:14 AM   Result Value Ref Range    WBC 2.7 (L) 4.0 -  10.5 10*9/L    RBC 2.53 (L) 3.80 - 5.10 10*12/L    HGB 8.1 (L) 11.5 - 15.0 g/dL    HCT 16.1 (L) 09.6 - 44.0 %    MCV 100.0 (H) 80.0 - 98.0 fL    MCH 32.0 27.0 - 34.0 pg    MCHC 32.0 32.0 - 36.0 g/dL    RDW 04.5 (H) 40.9 - 14.5 %    MPV 12.8 (H) 7.4 - 10.4 fL    Platelet 40 (L) 140 - 415 10*9/L    Neutrophils % 65.1 %    Lymphocytes % 19.3 %    Monocytes % 11.5 %    Eosinophils % 3.0 %    Basophils % 0.7 %    Absolute Neutrophils 1.8 1.8 - 7.8 10*9/L    Absolute Lymphocytes 0.5 (L) 0.7 - 4.5 10*9/L    Absolute Monocytes 0.3 0.1 - 1.0 10*9/L    Absolute Eosinophils 0.1 0.0 - 0.4 10*9/L    Absolute Basophils 0.0 0.0 - 0.2 10*9/L   POCT Glucose    Collection Time: 11/30/20  7:38 AM   Result Value Ref Range    Glucose, POC 206 (H) 70 - 105 mg/dL   Type and Screen    Collection Time: 11/30/20 11:03 AM   Result Value Ref Range    Blood Type O NEG     Antibody Screen NEG    POCT Glucose    Collection Time: 11/30/20 11:29 AM   Result Value Ref Range    Glucose, POC 279 (H) 70 - 105 mg/dL   POCT Glucose    Collection Time: 11/30/20  4:22 PM   Result Value Ref Range    Glucose, POC 312 (H) 70 - 105 mg/dL   Prepare RBC    Collection Time: 11/30/20  6:36 PM   Result Value Ref Range    Crossmatch Compatible     Unit Blood Type O Neg     ISBT Number 9500     Unit # [id number]     Status Issued     Spec Expiration [id number]     Product ID Red Blood Cells     PRODUCT CODE [id number]     Crossmatch Compatible     Unit Blood Type O Neg     ISBT Number 9500     Unit # [id number]     Status Issued     Spec Expiration [id number]     Product ID Red Blood Cells     PRODUCT CODE [id number]    POCT Glucose    Collection Time: 11/30/20  7:22 PM   Result Value Ref Range    Glucose, POC 236 (H) 70 - 105 mg/dL          Ignatius Specking, MD

## 2020-12-01 NOTE — Unmapped (Signed)
Call placed and spoke with pts husband, Marilyn French. Informed that Committee Review has declined pt for liver transplant due to ongoing frailty. Pt husband is sad, but understands his wife's wishes and has accepted hospice care. Plan for pt to be dc'd home from [facility] this evening. Transplant episode closed.

## 2020-12-01 NOTE — Unmapped (Signed)
Billing Information     Ignatius Specking, MD     Patient:  Marilyn French female     Date of Birth:  1956/01/19     MRN:  [id number]    Status:  Inpatient     Length of Stay:  5    Admit Date:  11/26/2020 12:50 PM     Discharge Date:  12/01/2020    Problem List  Principal Problem:    Hepatic encephalopathy (CMS-HCC)  Active Problems:    Cirrhosis (CMS-HCC)    GAVE (gastric antral vascular ectasia)    Type 2 diabetes mellitus without complication, with long-term current use of insulin (CMS-HCC)    Pulmonary hypertension (CMS-HCC)    Thrombocytopenia (CMS-HCC)    AKI (acute kidney injury) (CMS-HCC)    Symptomatic anemia  Resolved Problems:    * No resolved hospital problems. *       Diagnosis:   Diagnosis ICD-10-CM Associated Orders   1. Hepatic encephalopathy (CMS-HCC)  K72.90    2. Acute UTI  N39.0

## 2020-12-01 NOTE — Unmapped (Signed)
Physician Discharge Summary    Admit date: 11/26/2020    Discharge date: 12/01/2020     Discharge to: Home with hospice    Discharge Service: General Medicine (MED)    Discharge Attending Physician: Ignatius Specking, MD    Discharge Diagnoses:   Principal Problem:    Hepatic encephalopathy (CMS-HCC)  Active Problems:    Cirrhosis (CMS-HCC)    GAVE (gastric antral vascular ectasia)    Type 2 diabetes mellitus without complication, with long-term current use of insulin (CMS-HCC)    Pulmonary hypertension (CMS-HCC)    Thrombocytopenia (CMS-HCC)    AKI (acute kidney injury) (CMS-HCC)    Symptomatic anemia  Resolved Problems:    * No resolved hospital problems. *        Procedures: None    Consults:          Pertinent Test Results:   All lab results last 24 hours:    Recent Results (from the past 24 hour(s))   POCT Glucose    Collection Time: 11/30/20  4:22 PM   Result Value Ref Range    Glucose, POC 312 (H) 70 - 105 mg/dL   Prepare RBC    Collection Time: 11/30/20  6:36 PM   Result Value Ref Range    Crossmatch Compatible     Unit Blood Type O Neg     ISBT Number 9500     Unit # [id number]     Status Issued     Spec Expiration [id number]     Product ID Red Blood Cells     PRODUCT CODE [id number]     Crossmatch Compatible     Unit Blood Type O Neg     ISBT Number 9500     Unit # [id number]     Status Issued     Spec Expiration [id number]     Product ID Red Blood Cells     PRODUCT CODE [id number]    POCT Glucose    Collection Time: 11/30/20  7:22 PM   Result Value Ref Range    Glucose, POC 236 (H) 70 - 105 mg/dL   CBC w/ Differential    Collection Time: 12/01/20  3:45 AM   Result Value Ref Range    WBC 2.8 (L) 4.0 - 10.5 10*9/L    RBC 3.24 (L) 3.80 - 5.10 10*12/L    HGB 10.0 (L) 11.5 - 15.0 g/dL    HCT 58.0 (L) 99.8 - 44.0 %    MCV 94.8 80.0 - 98.0 fL    MCH 30.9 27.0 - 34.0 pg    MCHC 32.6 32.0 - 36.0 g/dL    RDW 33.8 (H) 25.0 - 14.5 %    MPV 13.5 (H) 7.4 - 10.4 fL    Platelet 34 (LL) 140 - 415 10*9/L    Neutrophils % 56.9 %    Lymphocytes % 24.2 %    Monocytes % 13.5 %    Eosinophils % 3.9 %    Basophils % 1.1 %    Absolute Neutrophils 1.6 (L) 1.8 - 7.8 10*9/L    Absolute Lymphocytes 0.7 0.7 - 4.5 10*9/L    Absolute Monocytes 0.4 0.1 - 1.0 10*9/L    Absolute Eosinophils 0.1 0.0 - 0.4 10*9/L    Absolute Basophils 0.0 0.0 - 0.2 10*9/L   POCT Glucose    Collection Time: 12/01/20  7:51 AM   Result Value Ref Range    Glucose, POC 194 (H) 70 - 105 mg/dL   Ammonia  Collection Time: 12/01/20 11:43 AM   Result Value Ref Range    Ammonia 43 (H) 11 - 32 umol/L   POCT Glucose    Collection Time: 12/01/20 11:49 AM   Result Value Ref Range    Glucose, POC 294 (H) 70 - 105 mg/dL       Hospital Course:  Patient is admitted to the hospital with hepatic encephalopathy and change in mental status.  Patient has end-stage liver disease and cirrhosis with progressive worsening.  Patient is not a transplant candidate according to the hospital.  I had a long discussion with the patient on 11/29/2020.  Patient is tired at this point.  She wants to live with her sister.  She will monitor consider hospice.  I want hospice to talk to the patient and the husband while she is in the hospital to decide the further course of action.  At this time patient is not a transplant candidate and she does not want to wait.  She want to be comfortable and at home.  Her sister will be the best alternative option and if it is not possible then skilled nursing facility.  Hospice consult.  Case management to be involved.     Patient had anemia required 2 units of blood transfusion.  CBC was followed after that.  Long discussion with the patient and sister.  Also discussion with the case manager multiple times on 11/30/2020.  Care conference was obtained.  After that discussion patient will be going home with the hospice.  Hospital bed would be arranged.  Hospice will try to help the patient at home.  When clinical condition deteriorates patient can be going to the hospice home.  At this time end of the life issues were discussed.  Patient is not a liver transplant candidate progressive worsening over the last 8 years.  Discussion the patient and the all family members and case managers.  Patient will be discharged with home hospice.       I spent greater than 30 mins in the discharge of this patient.    Condition at Discharge: stable  Discharge Medications:      Your Medication List      STOP taking these medications    cholecalciferol-25 mcg (1,000 unit) 1,000 unit (25 mcg) tablet  Generic drug: cholecalciferol (vitamin D3 25 mcg (1,000 units))     ferrous sulfate 325 (65 FE) MG tablet     rosuvastatin 5 MG tablet  Commonly known as: CRESTOR     spironolactone 100 MG tablet  Commonly known as: ALDACTONE     ZINC ACETATE ORAL        CHANGE how you take these medications    LANTUS SOLOSTAR U-100 INSULIN 100 unit/mL (3 mL) injection pen  Generic drug: insulin glargine  Inject 0.45 mL (45 Units total) under the skin daily.  What changed: how much to take        CONTINUE taking these medications    bumetanide 2 MG tablet  Commonly known as: BUMEX  Take 1 tablet by mouth twice daily     busPIRone 10 MG tablet  Commonly known as: BUSPAR  Take 1 tablet by mouth twice daily     flintstones complete tablet  Generic drug: pediatric multivit-iron-min  Chew 2 tablets daily.     insulin lispro 100 unit/mL injection  Commonly known as: HumaLOG  INJECT 15 TO 35 UNITS SUBCUTANEUOSLY PER SLIDING SCALE 3 TIMES DAILY     lactulose 10 gram/15 mL  solution  Commonly known as: CHRONULAC  Take 67.5 mL (45 g total) by mouth four (4) times a day.     levothyroxine 25 MCG tablet  Commonly known as: SYNTHROID  Take 25 mcg by mouth daily.     lidocaine-prilocaine 2.5-2.5 % cream  Commonly known as: EMLA  Apply topically.     MAGNESIUM ORAL  Take by mouth.     pantoprazole 40 MG tablet  Commonly known as: PROTONIX  Take 1 tablet (40 mg total) by mouth daily.     pen needle, diabetic 31 gauge x 1/4 (6 mm) Ndle potassium chloride 20 MEQ CR tablet  Commonly known as: KLOR-CON  Take 20 mEq by mouth daily.     propranoloL 20 MG tablet  Commonly known as: INDERAL  daily.     rifAXIMin 550 mg Tab  Commonly known as: XIFAXAN  Take 1 tablet (550 mg total) by mouth Two (2) times a day.     sertraline 50 MG tablet  Commonly known as: ZOLOFT  Take 1 tablet by mouth once daily            Labs:  Blood  Recent Labs   Lab Units 12/01/20  0345 11/30/20  0414 11/28/20  0452 11/27/20  0636 11/26/20  1337   WBC 10*9/L 2.8* 2.7* 3.8* 3.9* 4.5   HEMOGLOBIN g/dL 13.0* 8.1* 9.4* 9.6* 86.5*   HEMATOCRIT % 30.7* 25.3* 29.7* 30.9* 32.0*   PLATELET COUNT (1) 10*9/L 34* 40* 29* 45* 46*     Recent Labs   Lab Units 12/01/20  1143 11/30/20  0414 11/29/20  1137 11/29/20  0617 11/28/20  0526 11/28/20  0453 11/27/20  0636 11/26/20  1415 11/26/20  1337   SODIUM mmol/L  --  135  --  137  --  137 143  --  141   POTASSIUM mmol/L  --  3.6  --  3.1*  --  3.2* 3.5  --  4.0   CHLORIDE mmol/L  --  106  --  106  --  105 111*  --  110*   CO2 mmol/L  --  23.1  --  23.3  --  21.8 21.6  --  24.2   BUN mg/dL  --  26*  --  22*  --  25* 28*  --  25*   CREATININE mg/dL  --  7.84*  --  6.96*  --  1.69* 1.73*  --  1.63*   GLUCOSE mg/dL  --  295*  --  284*  --  296* 201*  --  82   CALCIUM mg/dL  --  7.0*  --  6.6*  --  7.4* 8.0*  --  8.2*   ALBUMIN g/dL  --  1.5*  --   --   --  1.7* 1.8*  --  1.9*   PROTEIN TOTAL g/dL  --  3.7*  --   --   --  4.1* 4.3*  --  4.6*   BILIRUBIN TOTAL mg/dL  --  1.5*  --   --   --  2.3* 2.9*  --  2.8*   AST U/L  --  32  --   --   --  37 37  --  48*   ALT U/L  --  20  --   --   --  27 25  --  27   ALK PHOS U/L  --  95  --   --   --  100 95  --  95   MAGNESIUM mg/dL  --   --   --   --   --   --   --   --  2.0   AMMONIA umol/L 43* 46* 35*  --  52*  --  55* 147*  --      No results in the last week  Recent Labs   Lab Units 11/26/20  1447   INR  1.31   APTT sec 25.9     No results in the last week   No results in the last week  Urine  Recent Labs Lab Units 11/26/20  1347   WBC UA /HPF 10*   NITRITE UA  Positive*   LEUKOCYTES UA  Negative   BACTERIA UA /HPF Large*   BLOOD UA  Trace*   GLUCOSE UA  Negative   PROTEIN UA  Negative   KETONES UA  Negative     No results in the last week  Body Fluids  No results in the last week  ABG  No results for input(s): O2SOUR, FIO2ART, PHART, PCO2ART, PO2ART, HCO3ART, O2SATART, BEART in the last 72 hours.  Microbiology Results (last day)     Procedure Component Value Date/Time Date/Time    Blood Culture #1 [[id number]]  (Normal) Collected: 11/26/20 1559    Lab Status: Preliminary result Specimen: Blood from 1 Peripheral Draw Updated: 11/30/20 1715     Blood Culture, Routine No Growth at 4 days    Blood Culture #2 [[id number]]  (Normal) Collected: 11/26/20 1606    Lab Status: Preliminary result Specimen: Blood from 1 Peripheral Draw Updated: 11/30/20 1715     Blood Culture, Routine No Growth at 4 days           Radiology:  ECG 12 lead (Adult)    Result Date: 11/26/2020  Sinus rhythm with sinus arrhythmia with 1st degree AV block Left anterior fascicular block Possible Anterior infarct , age undetermined Abnormal ECG No previous ECGs available Confirmed by Gwenyth Bouillon ([id number]) on 11/26/2020 3:53:54 PM    XR Chest Portable    Result Date: 11/26/2020  CLINICAL DATA:  Catheter placement EXAM: PORTABLE CHEST 1 VIEW COMPARISON:  X-ray earlier in the same day FINDINGS: The enteric tube appears to project over the gastric body. The tip is pointed distally. The lung volumes are low. The heart size is stable. Bibasilar atelectasis is noted. The right subclavian Port-A-Cath is unchanged. No pneumothorax although evaluation is limited by patient positioning.     Lines and tubes as above. No pneumothorax. Persistent bibasilar atelectasis and/or infiltrate. Electronically Signed   By: Katherine Mantle M.D.   On: 11/26/2020 17:00     XR Chest Portable    Result Date: 11/26/2020  CLINICAL DATA:  Altered mental status, liver failure, fall EXAM: PORTABLE CHEST 1 VIEW COMPARISON:  11/14/2020 FINDINGS: Right chest port catheter. Cardiomegaly. Mild, diffuse interstitial pulmonary opacity. The visualized skeletal structures are unremarkable.     Cardiomegaly with mild, diffuse interstitial pulmonary opacity, which may reflect mild edema. No focal airspace opacity. Electronically Signed   By: Lauralyn Primes M.D.   On: 11/26/2020 14:18     CT head without contrast    Result Date: 11/26/2020  CLINICAL DATA:  Mental status change. EXAM: CT HEAD WITHOUT CONTRAST TECHNIQUE: Contiguous axial images were obtained from the base of the skull through the vertex without intravenous contrast. COMPARISON:  CT head 11/14/2020 FINDINGS: Brain: No evidence of acute infarction, hemorrhage, hydrocephalus, extra-axial collection or mass lesion/mass effect. Vascular:  Negative for hyperdense vessel Skull: Negative Sinuses/Orbits: Mild mucosal edema paranasal sinuses. Negative orbit Other: None     Negative CT of the brain. Mild mucosal edema paranasal sinuses. Electronically Signed   By: Marlan Palau M.D.   On: 11/26/2020 14:53     XR Abdomen 1 View    Result Date: 11/26/2020  CLINICAL DATA:  NG tube placement. EXAM: ABDOMEN - 1 VIEW COMPARISON:  Jan 26, 2021 FINDINGS: The enteric tube tip projects over the gastric body. The tip is pointed distally. The tube is now likely kinked at the side port. The Port-A-Cath is unchanged. The lung fields are essentially unchanged. More conspicuous on this examination is a TIPS stent projecting over the patient's liver. There appears to be an Amplatzer plug projecting over the mid abdomen.     Tip of the enteric tube projects over the gastric body. The tube appears to be kinked at the side port. Electronically Signed   By: Katherine Mantle M.D.   On: 11/26/2020 17:28       Discharge Instructions:   Activity Instructions     Activity as tolerated          Diet Instructions     Discharge diet (specify)      Discharge Nutrition Therapy: Consistent Carb    Consistent Carb Level: Consistent Carb 45/45/45 (3/3/3)        Other Instructions     Call MD for:  severe uncontrolled pain               Ignatius Specking, MD

## 2020-12-20 DIAGNOSIS — Z7682 Awaiting organ transplant status: Principal | ICD-10-CM

## 2020-12-20 DIAGNOSIS — K7210 Chronic hepatic failure without coma: Principal | ICD-10-CM

## 2021-01-16 DEATH — deceased

## 2021-01-17 DIAGNOSIS — Z7682 Awaiting organ transplant status: Principal | ICD-10-CM

## 2021-01-17 DIAGNOSIS — K7210 Chronic hepatic failure without coma: Principal | ICD-10-CM

## 2021-02-14 DIAGNOSIS — Z7682 Awaiting organ transplant status: Principal | ICD-10-CM

## 2021-02-14 DIAGNOSIS — K7210 Chronic hepatic failure without coma: Principal | ICD-10-CM

## 2021-03-14 DIAGNOSIS — K7210 Chronic hepatic failure without coma: Principal | ICD-10-CM

## 2021-03-14 DIAGNOSIS — Z7682 Awaiting organ transplant status: Principal | ICD-10-CM

## 2021-04-11 DIAGNOSIS — K7210 Chronic hepatic failure without coma: Principal | ICD-10-CM

## 2021-04-11 DIAGNOSIS — Z7682 Awaiting organ transplant status: Principal | ICD-10-CM

## 2021-05-09 DIAGNOSIS — Z7682 Awaiting organ transplant status: Principal | ICD-10-CM

## 2021-05-09 DIAGNOSIS — K7210 Chronic hepatic failure without coma: Principal | ICD-10-CM

## 2021-06-06 DIAGNOSIS — K7210 Chronic hepatic failure without coma: Principal | ICD-10-CM

## 2021-06-06 DIAGNOSIS — Z7682 Awaiting organ transplant status: Principal | ICD-10-CM

## 2021-07-04 DIAGNOSIS — K7210 Chronic hepatic failure without coma: Principal | ICD-10-CM

## 2021-07-04 DIAGNOSIS — Z7682 Awaiting organ transplant status: Principal | ICD-10-CM

## 2021-08-01 DIAGNOSIS — K7210 Chronic hepatic failure without coma: Principal | ICD-10-CM

## 2021-08-01 DIAGNOSIS — Z7682 Awaiting organ transplant status: Principal | ICD-10-CM

## 2021-08-10 DIAGNOSIS — K7682 Hepatic encephalopathy: Principal | ICD-10-CM
